# Patient Record
Sex: Male | Born: 1945 | Race: Black or African American | Hispanic: No | Marital: Single | Smoking: Current some day smoker
Health system: Southern US, Community
[De-identification: ages and names within clinical notes are randomized; demographics above are authoritative.]

## PROBLEM LIST (undated history)

## (undated) DIAGNOSIS — E785 Hyperlipidemia, unspecified: Secondary | ICD-10-CM

## (undated) DIAGNOSIS — K2270 Barrett's esophagus without dysplasia: Secondary | ICD-10-CM

## (undated) DIAGNOSIS — Z720 Tobacco use: Secondary | ICD-10-CM

## (undated) DIAGNOSIS — M1990 Unspecified osteoarthritis, unspecified site: Secondary | ICD-10-CM

## (undated) DIAGNOSIS — K449 Diaphragmatic hernia without obstruction or gangrene: Secondary | ICD-10-CM

## (undated) DIAGNOSIS — Z86010 Personal history of colonic polyps: Secondary | ICD-10-CM

## (undated) DIAGNOSIS — K08109 Complete loss of teeth, unspecified cause, unspecified class: Secondary | ICD-10-CM

## (undated) DIAGNOSIS — K635 Polyp of colon: Secondary | ICD-10-CM

## (undated) DIAGNOSIS — I219 Acute myocardial infarction, unspecified: Secondary | ICD-10-CM

## (undated) DIAGNOSIS — N390 Urinary tract infection, site not specified: Secondary | ICD-10-CM

## (undated) DIAGNOSIS — I10 Essential (primary) hypertension: Secondary | ICD-10-CM

## (undated) DIAGNOSIS — K3182 Dieulafoy lesion (hemorrhagic) of stomach and duodenum: Secondary | ICD-10-CM

## (undated) DIAGNOSIS — I714 Abdominal aortic aneurysm, without rupture, unspecified: Secondary | ICD-10-CM

## (undated) DIAGNOSIS — F1010 Alcohol abuse, uncomplicated: Secondary | ICD-10-CM

## (undated) DIAGNOSIS — K8020 Calculus of gallbladder without cholecystitis without obstruction: Secondary | ICD-10-CM

## (undated) DIAGNOSIS — N179 Acute kidney failure, unspecified: Secondary | ICD-10-CM

## (undated) DIAGNOSIS — Z860101 Personal history of adenomatous and serrated colon polyps: Secondary | ICD-10-CM

## (undated) DIAGNOSIS — C3491 Malignant neoplasm of unspecified part of right bronchus or lung: Secondary | ICD-10-CM

## (undated) DIAGNOSIS — K5730 Diverticulosis of large intestine without perforation or abscess without bleeding: Secondary | ICD-10-CM

## (undated) DIAGNOSIS — Z972 Presence of dental prosthetic device (complete) (partial): Secondary | ICD-10-CM

## (undated) DIAGNOSIS — I2510 Atherosclerotic heart disease of native coronary artery without angina pectoris: Secondary | ICD-10-CM

## (undated) DIAGNOSIS — K219 Gastro-esophageal reflux disease without esophagitis: Secondary | ICD-10-CM

## (undated) DIAGNOSIS — C3490 Malignant neoplasm of unspecified part of unspecified bronchus or lung: Secondary | ICD-10-CM

## (undated) DIAGNOSIS — N320 Bladder-neck obstruction: Secondary | ICD-10-CM

## (undated) DIAGNOSIS — K648 Other hemorrhoids: Secondary | ICD-10-CM

## (undated) DIAGNOSIS — H269 Unspecified cataract: Secondary | ICD-10-CM

## (undated) DIAGNOSIS — I509 Heart failure, unspecified: Secondary | ICD-10-CM

## (undated) HISTORY — DX: Barrett's esophagus without dysplasia: K22.70

## (undated) HISTORY — DX: Hyperlipidemia, unspecified: E78.5

## (undated) HISTORY — DX: Urinary tract infection, site not specified: N39.0

## (undated) HISTORY — DX: Gastro-esophageal reflux disease without esophagitis: K21.9

## (undated) HISTORY — DX: Bladder-neck obstruction: N32.0

## (undated) HISTORY — DX: Heart failure, unspecified: I50.9

## (undated) HISTORY — DX: Abdominal aortic aneurysm, without rupture, unspecified: I71.40

## (undated) HISTORY — DX: Polyp of colon: K63.5

## (undated) HISTORY — DX: Alcohol abuse, uncomplicated: F10.10

## (undated) HISTORY — DX: Unspecified cataract: H26.9

## (undated) HISTORY — DX: Malignant neoplasm of unspecified part of unspecified bronchus or lung: C34.90

## (undated) HISTORY — PX: MULTIPLE TOOTH EXTRACTIONS: SHX2053

## (undated) HISTORY — DX: Presence of dental prosthetic device (complete) (partial): Z97.2

## (undated) HISTORY — DX: Tobacco use: Z72.0

## (undated) HISTORY — DX: Personal history of colonic polyps: Z86.010

## (undated) HISTORY — DX: Malignant neoplasm of unspecified part of right bronchus or lung: C34.91

## (undated) HISTORY — DX: Other hemorrhoids: K64.8

## (undated) HISTORY — DX: Essential (primary) hypertension: I10

## (undated) HISTORY — DX: Acute kidney failure, unspecified: N17.9

## (undated) HISTORY — DX: Abdominal aortic aneurysm, without rupture: I71.4

## (undated) HISTORY — DX: Calculus of gallbladder without cholecystitis without obstruction: K80.20

## (undated) HISTORY — DX: Acute myocardial infarction, unspecified: I21.9

## (undated) HISTORY — DX: Diverticulosis of large intestine without perforation or abscess without bleeding: K57.30

## (undated) HISTORY — DX: Dieulafoy lesion (hemorrhagic) of stomach and duodenum: K31.82

## (undated) HISTORY — DX: Unspecified osteoarthritis, unspecified site: M19.90

## (undated) HISTORY — PX: CORONARY ANGIOPLASTY WITH STENT PLACEMENT: SHX49

## (undated) HISTORY — DX: Diaphragmatic hernia without obstruction or gangrene: K44.9

## (undated) HISTORY — PX: UPPER GASTROINTESTINAL ENDOSCOPY: SHX188

## (undated) HISTORY — DX: Complete loss of teeth, unspecified cause, unspecified class: K08.109

## (undated) HISTORY — DX: Personal history of adenomatous and serrated colon polyps: Z86.0101

## (undated) HISTORY — PX: COLONOSCOPY: SHX174

---

## 2002-06-17 DIAGNOSIS — I219 Acute myocardial infarction, unspecified: Secondary | ICD-10-CM

## 2002-06-17 HISTORY — DX: Acute myocardial infarction, unspecified: I21.9

## 2003-05-01 ENCOUNTER — Inpatient Hospital Stay (HOSPITAL_COMMUNITY): Admission: AD | Admit: 2003-05-01 | Discharge: 2003-05-05 | Payer: Self-pay | Admitting: *Deleted

## 2003-05-02 ENCOUNTER — Encounter: Payer: Self-pay | Admitting: Internal Medicine

## 2003-05-11 ENCOUNTER — Encounter: Admission: RE | Admit: 2003-05-11 | Discharge: 2003-05-11 | Payer: Self-pay | Admitting: Internal Medicine

## 2003-06-18 HISTORY — PX: INGUINAL HERNIA REPAIR: SUR1180

## 2003-06-29 ENCOUNTER — Encounter: Admission: RE | Admit: 2003-06-29 | Discharge: 2003-06-29 | Payer: Self-pay | Admitting: Internal Medicine

## 2003-08-13 ENCOUNTER — Inpatient Hospital Stay (HOSPITAL_COMMUNITY): Admission: EM | Admit: 2003-08-13 | Discharge: 2003-08-15 | Payer: Self-pay | Admitting: Emergency Medicine

## 2003-08-31 ENCOUNTER — Encounter: Admission: RE | Admit: 2003-08-31 | Discharge: 2003-08-31 | Payer: Self-pay | Admitting: Internal Medicine

## 2003-09-14 ENCOUNTER — Encounter: Admission: RE | Admit: 2003-09-14 | Discharge: 2003-09-14 | Payer: Self-pay | Admitting: Internal Medicine

## 2003-10-06 ENCOUNTER — Ambulatory Visit (HOSPITAL_COMMUNITY): Admission: RE | Admit: 2003-10-06 | Discharge: 2003-10-06 | Payer: Self-pay | Admitting: Dentistry

## 2003-10-31 ENCOUNTER — Ambulatory Visit (HOSPITAL_COMMUNITY): Admission: RE | Admit: 2003-10-31 | Discharge: 2003-10-31 | Payer: Self-pay | Admitting: Internal Medicine

## 2003-10-31 ENCOUNTER — Encounter: Admission: RE | Admit: 2003-10-31 | Discharge: 2003-10-31 | Payer: Self-pay | Admitting: Internal Medicine

## 2003-11-09 ENCOUNTER — Ambulatory Visit (HOSPITAL_COMMUNITY): Admission: RE | Admit: 2003-11-09 | Discharge: 2003-11-09 | Payer: Self-pay | Admitting: General Surgery

## 2003-11-10 ENCOUNTER — Emergency Department (HOSPITAL_COMMUNITY): Admission: EM | Admit: 2003-11-10 | Discharge: 2003-11-10 | Payer: Self-pay | Admitting: Emergency Medicine

## 2003-11-11 ENCOUNTER — Emergency Department (HOSPITAL_COMMUNITY): Admission: EM | Admit: 2003-11-11 | Discharge: 2003-11-11 | Payer: Self-pay | Admitting: Emergency Medicine

## 2003-12-12 ENCOUNTER — Encounter: Admission: RE | Admit: 2003-12-12 | Discharge: 2003-12-12 | Payer: Self-pay | Admitting: General Surgery

## 2003-12-13 ENCOUNTER — Ambulatory Visit (HOSPITAL_BASED_OUTPATIENT_CLINIC_OR_DEPARTMENT_OTHER): Admission: RE | Admit: 2003-12-13 | Discharge: 2003-12-13 | Payer: Self-pay | Admitting: General Surgery

## 2003-12-13 ENCOUNTER — Ambulatory Visit (HOSPITAL_COMMUNITY): Admission: RE | Admit: 2003-12-13 | Discharge: 2003-12-13 | Payer: Self-pay | Admitting: General Surgery

## 2004-03-10 ENCOUNTER — Emergency Department (HOSPITAL_COMMUNITY): Admission: EM | Admit: 2004-03-10 | Discharge: 2004-03-11 | Payer: Self-pay | Admitting: Emergency Medicine

## 2004-03-26 ENCOUNTER — Ambulatory Visit: Payer: Self-pay | Admitting: Cardiology

## 2004-06-25 ENCOUNTER — Ambulatory Visit: Payer: Self-pay | Admitting: Internal Medicine

## 2004-07-03 ENCOUNTER — Ambulatory Visit (HOSPITAL_COMMUNITY): Admission: RE | Admit: 2004-07-03 | Discharge: 2004-07-03 | Payer: Self-pay | Admitting: Internal Medicine

## 2004-07-16 ENCOUNTER — Ambulatory Visit: Payer: Self-pay | Admitting: Internal Medicine

## 2004-08-30 ENCOUNTER — Ambulatory Visit: Payer: Self-pay | Admitting: Internal Medicine

## 2004-09-11 ENCOUNTER — Ambulatory Visit: Payer: Self-pay | Admitting: Internal Medicine

## 2004-09-24 ENCOUNTER — Ambulatory Visit: Payer: Self-pay | Admitting: Internal Medicine

## 2004-10-16 ENCOUNTER — Ambulatory Visit: Payer: Self-pay | Admitting: Internal Medicine

## 2004-10-25 ENCOUNTER — Ambulatory Visit: Payer: Self-pay | Admitting: Internal Medicine

## 2004-10-25 ENCOUNTER — Encounter (INDEPENDENT_AMBULATORY_CARE_PROVIDER_SITE_OTHER): Payer: Self-pay | Admitting: Specialist

## 2004-12-12 ENCOUNTER — Ambulatory Visit: Payer: Self-pay | Admitting: Internal Medicine

## 2005-03-26 ENCOUNTER — Ambulatory Visit: Payer: Self-pay | Admitting: Hospitalist

## 2005-04-05 ENCOUNTER — Ambulatory Visit: Payer: Self-pay | Admitting: Internal Medicine

## 2005-07-17 ENCOUNTER — Ambulatory Visit: Payer: Self-pay | Admitting: Cardiology

## 2005-07-25 ENCOUNTER — Ambulatory Visit: Payer: Self-pay

## 2005-09-17 ENCOUNTER — Ambulatory Visit: Payer: Self-pay | Admitting: Internal Medicine

## 2005-09-24 ENCOUNTER — Ambulatory Visit: Payer: Self-pay | Admitting: Hospitalist

## 2005-09-30 ENCOUNTER — Ambulatory Visit: Payer: Self-pay | Admitting: Internal Medicine

## 2005-10-07 ENCOUNTER — Ambulatory Visit: Payer: Self-pay | Admitting: Cardiology

## 2005-10-15 ENCOUNTER — Ambulatory Visit: Payer: Self-pay | Admitting: Internal Medicine

## 2005-11-04 ENCOUNTER — Ambulatory Visit: Payer: Self-pay | Admitting: Cardiology

## 2005-11-06 ENCOUNTER — Ambulatory Visit: Payer: Self-pay | Admitting: Cardiology

## 2005-11-15 ENCOUNTER — Emergency Department (HOSPITAL_COMMUNITY): Admission: EM | Admit: 2005-11-15 | Discharge: 2005-11-15 | Payer: Self-pay | Admitting: Emergency Medicine

## 2005-11-29 ENCOUNTER — Ambulatory Visit: Payer: Self-pay | Admitting: Internal Medicine

## 2005-12-06 ENCOUNTER — Encounter (INDEPENDENT_AMBULATORY_CARE_PROVIDER_SITE_OTHER): Payer: Self-pay | Admitting: Specialist

## 2005-12-06 ENCOUNTER — Ambulatory Visit: Payer: Self-pay | Admitting: Internal Medicine

## 2006-01-17 ENCOUNTER — Ambulatory Visit: Payer: Self-pay | Admitting: Hospitalist

## 2006-01-20 ENCOUNTER — Other Ambulatory Visit: Admission: RE | Admit: 2006-01-20 | Discharge: 2006-01-20 | Payer: Self-pay | Admitting: Internal Medicine

## 2006-01-20 ENCOUNTER — Encounter: Admission: RE | Admit: 2006-01-20 | Discharge: 2006-01-20 | Payer: Self-pay | Admitting: Internal Medicine

## 2006-01-20 ENCOUNTER — Encounter (INDEPENDENT_AMBULATORY_CARE_PROVIDER_SITE_OTHER): Payer: Self-pay | Admitting: *Deleted

## 2006-01-20 ENCOUNTER — Ambulatory Visit: Payer: Self-pay | Admitting: Internal Medicine

## 2006-01-24 ENCOUNTER — Ambulatory Visit: Payer: Self-pay | Admitting: Internal Medicine

## 2006-02-06 ENCOUNTER — Ambulatory Visit: Payer: Self-pay | Admitting: Internal Medicine

## 2006-02-13 ENCOUNTER — Ambulatory Visit: Payer: Self-pay | Admitting: Hospitalist

## 2006-03-22 IMAGING — CR DG CHEST 2V
2 series · 2 of 2 positions shown · non-contrast
Comparison: 08/13/03.

CLINICAL DATA: Shortness of breath.

[view not recorded (1 of 2)]
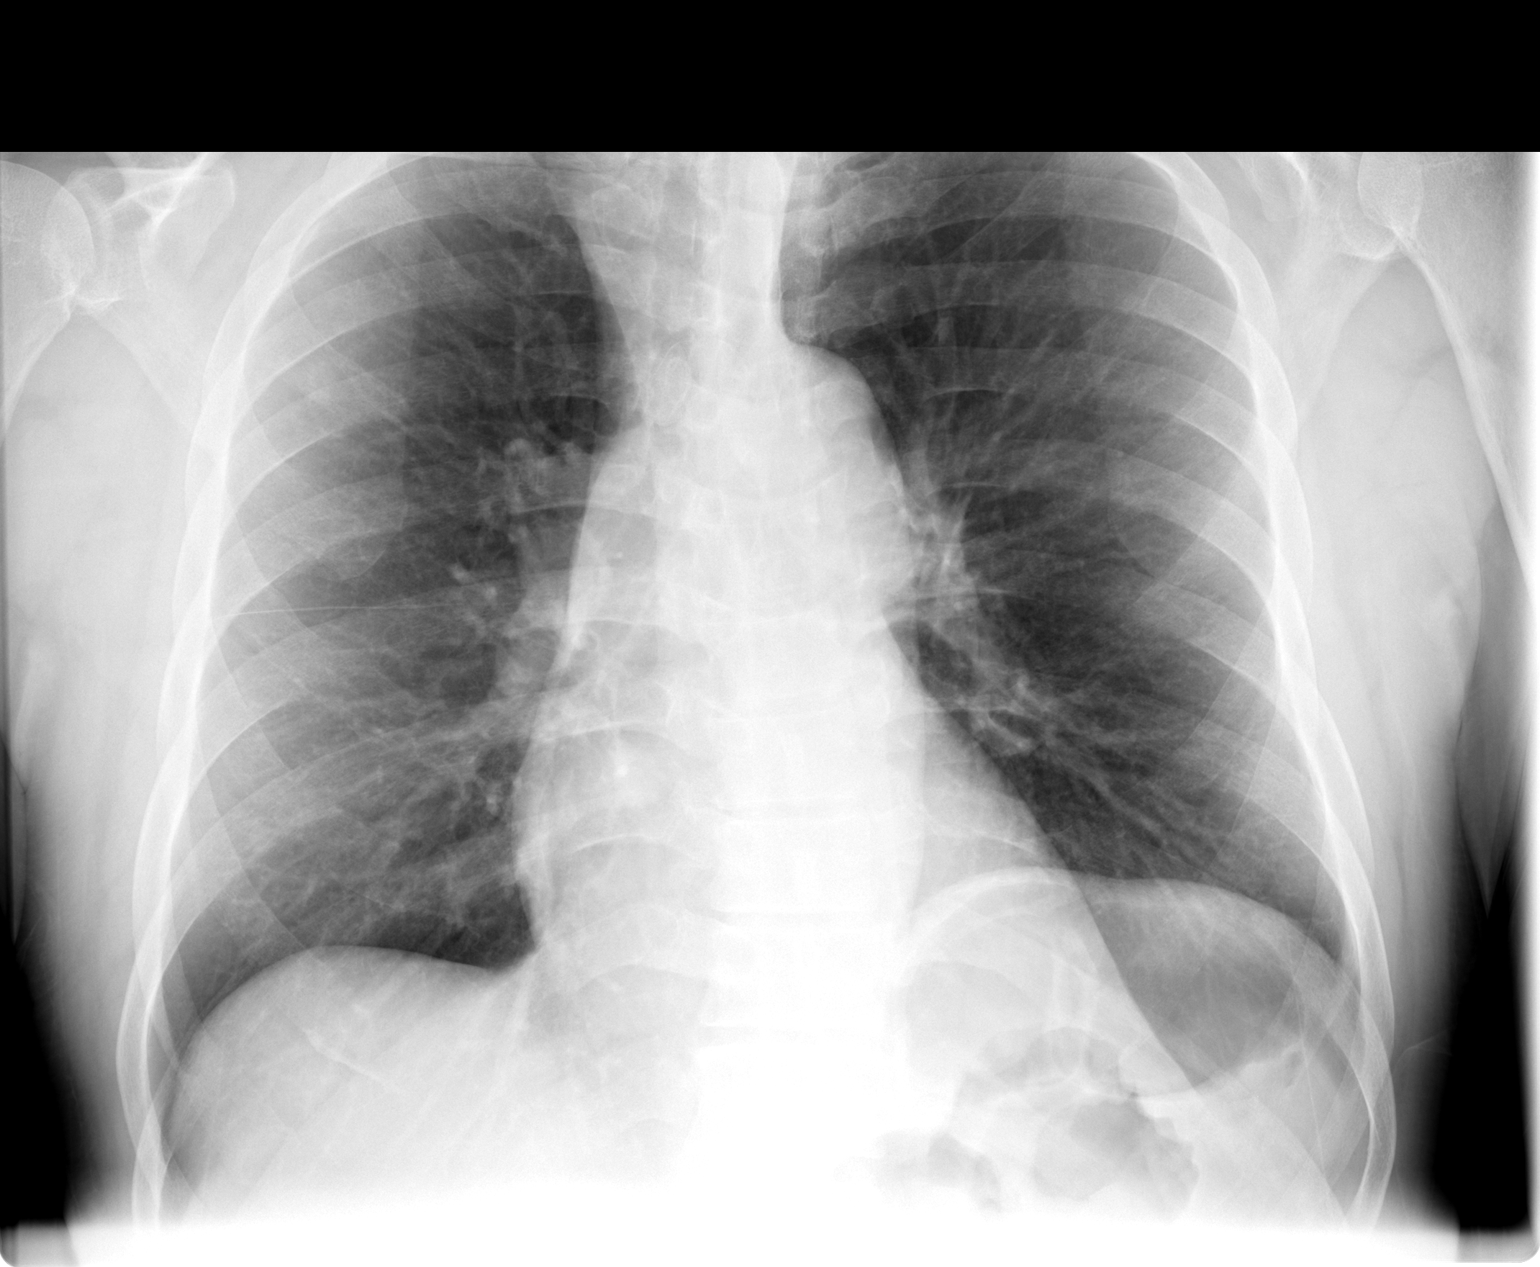

[view not recorded (2 of 2)]
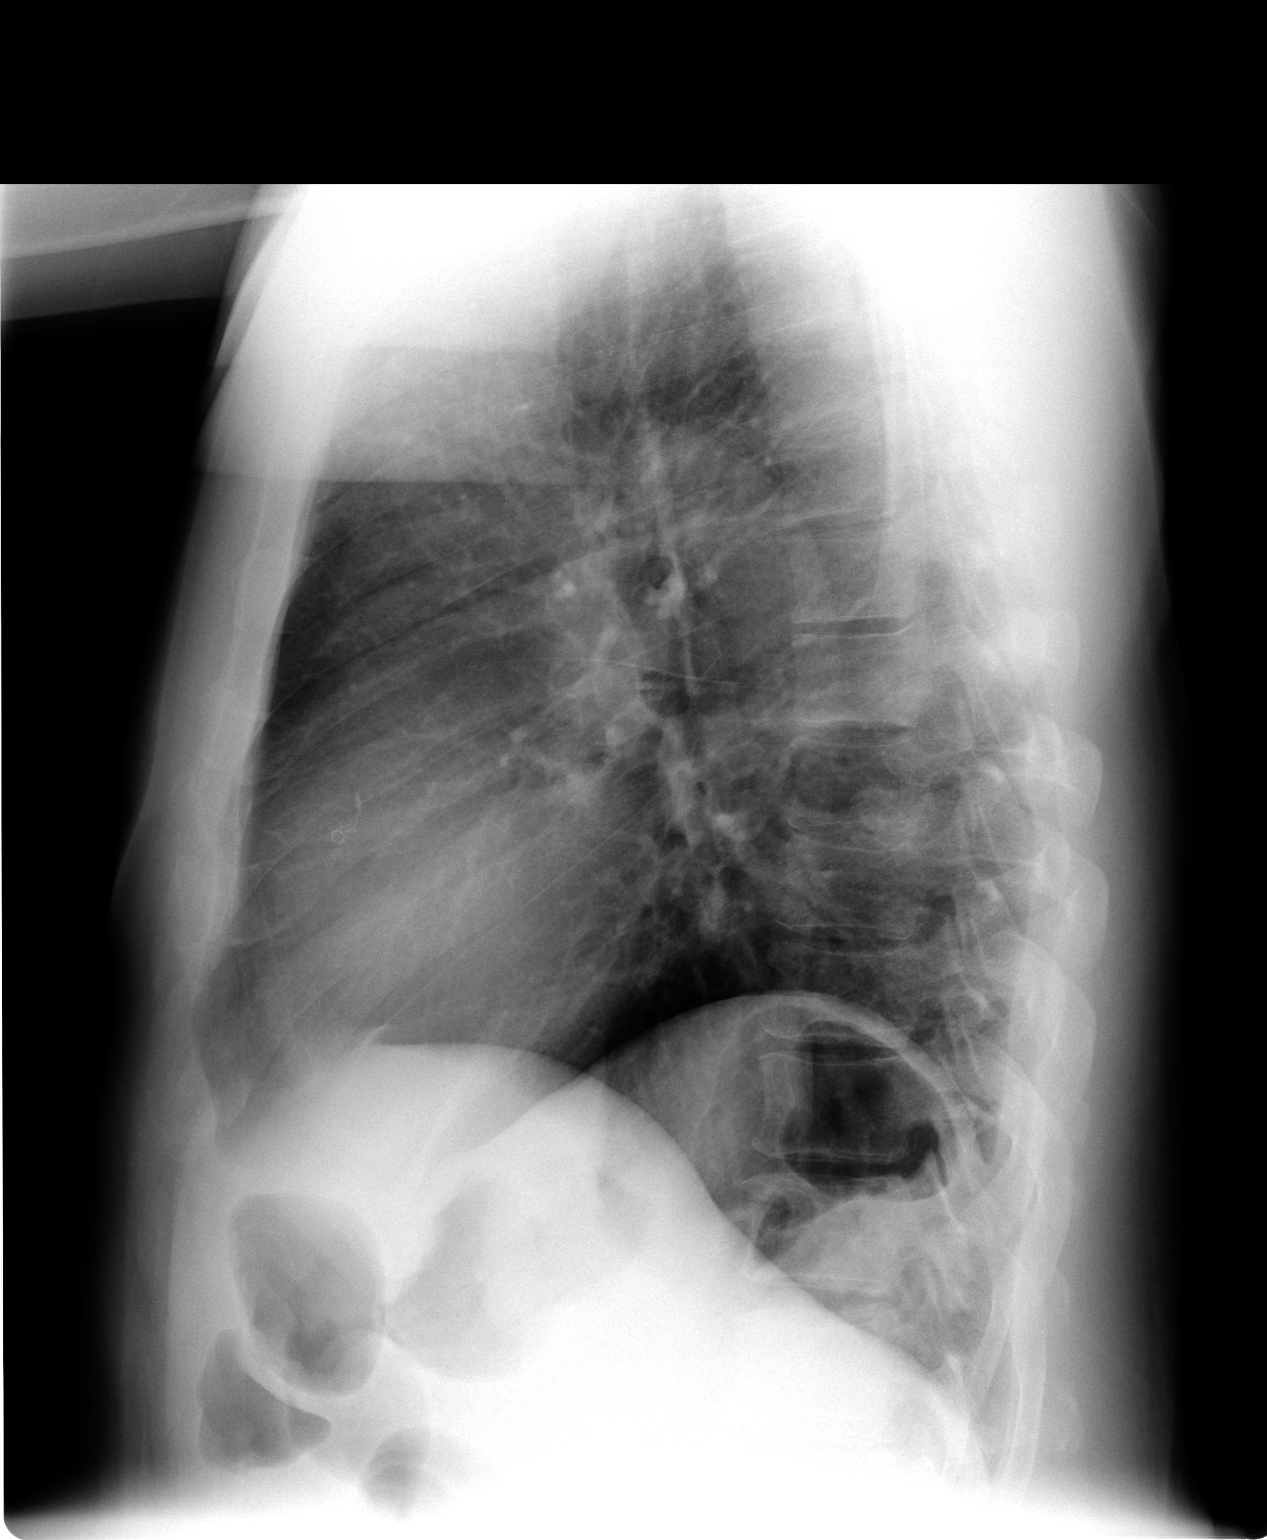

[2 of 2 positions shown; findings below may reference images not displayed]

CHEST TWO VIEWS
 The lungs are free of any focal consolidation or edema.  Cardiomediastinal contours are within normal limits.  There are no pleural effusions.  Bony thorax is intact.
 IMPRESSION
 No radiographic evidence of an acute cardiopulmonary process.

## 2006-04-02 IMAGING — CR DG KNEE COMPLETE 4+V*R*
4 series · 4 of 4 positions shown · non-contrast
Comparison: none

CLINICAL DATA: Right anterior knee pain.  No trauma.
 RIGHT KNEE 11/11/03
 There is prepatellar soft tissue swelling noted.  This is a nonspecific appearance, but may be seen with prepatellar bursitis.  There are no bony abnormalities, and there is no evidence for joint effusion. 
 IMPRESSION
 Prepatellar soft tissue swelling.  Otherwise normal study.

[view not recorded (1 of 4)]
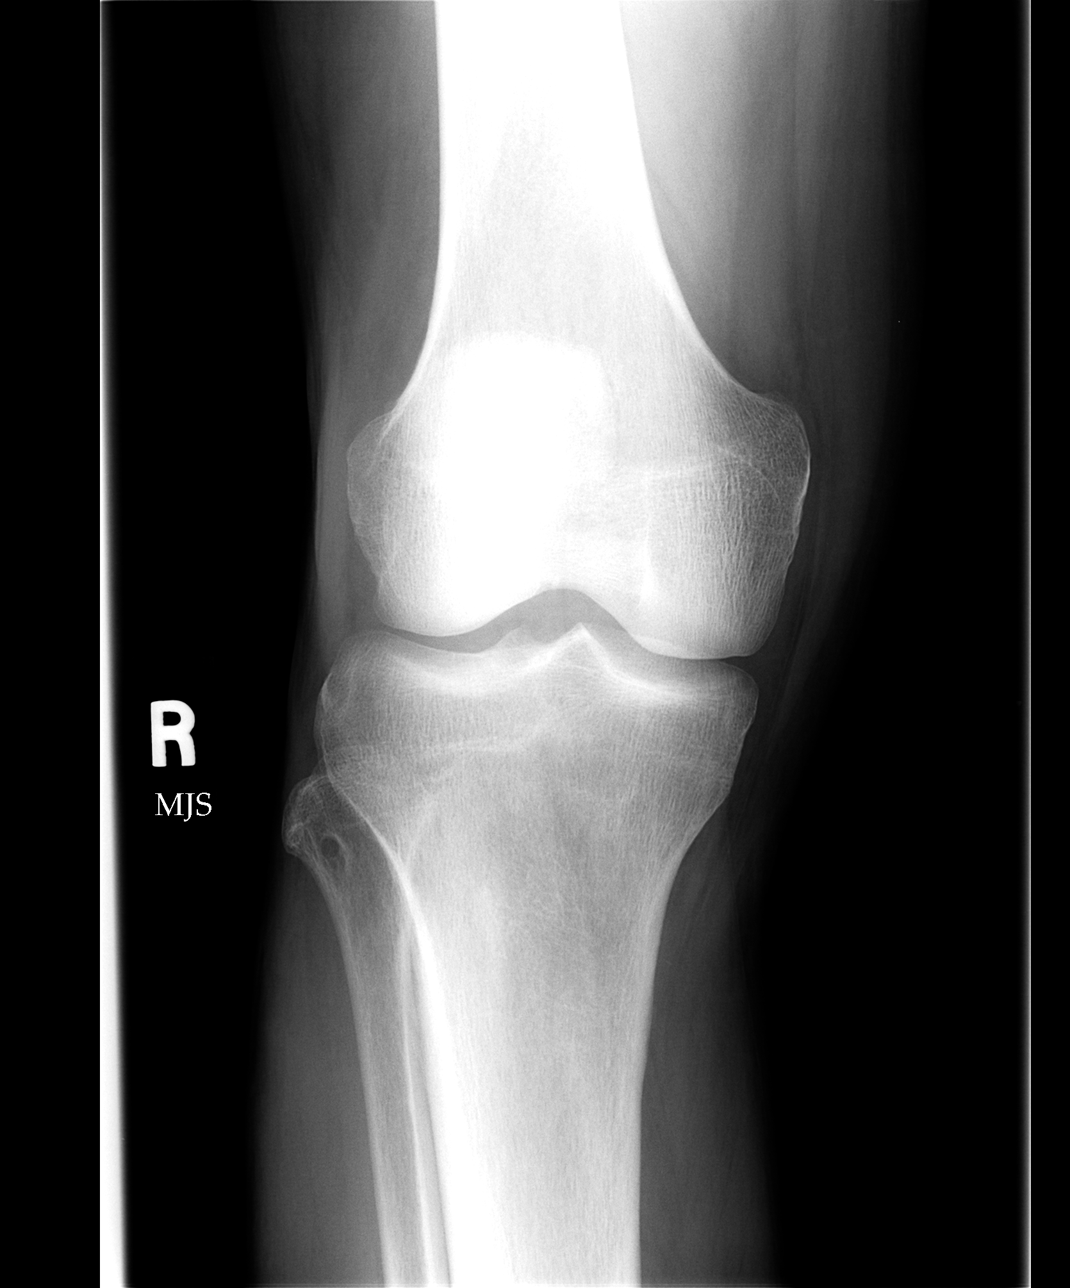

[view not recorded (2 of 4)]
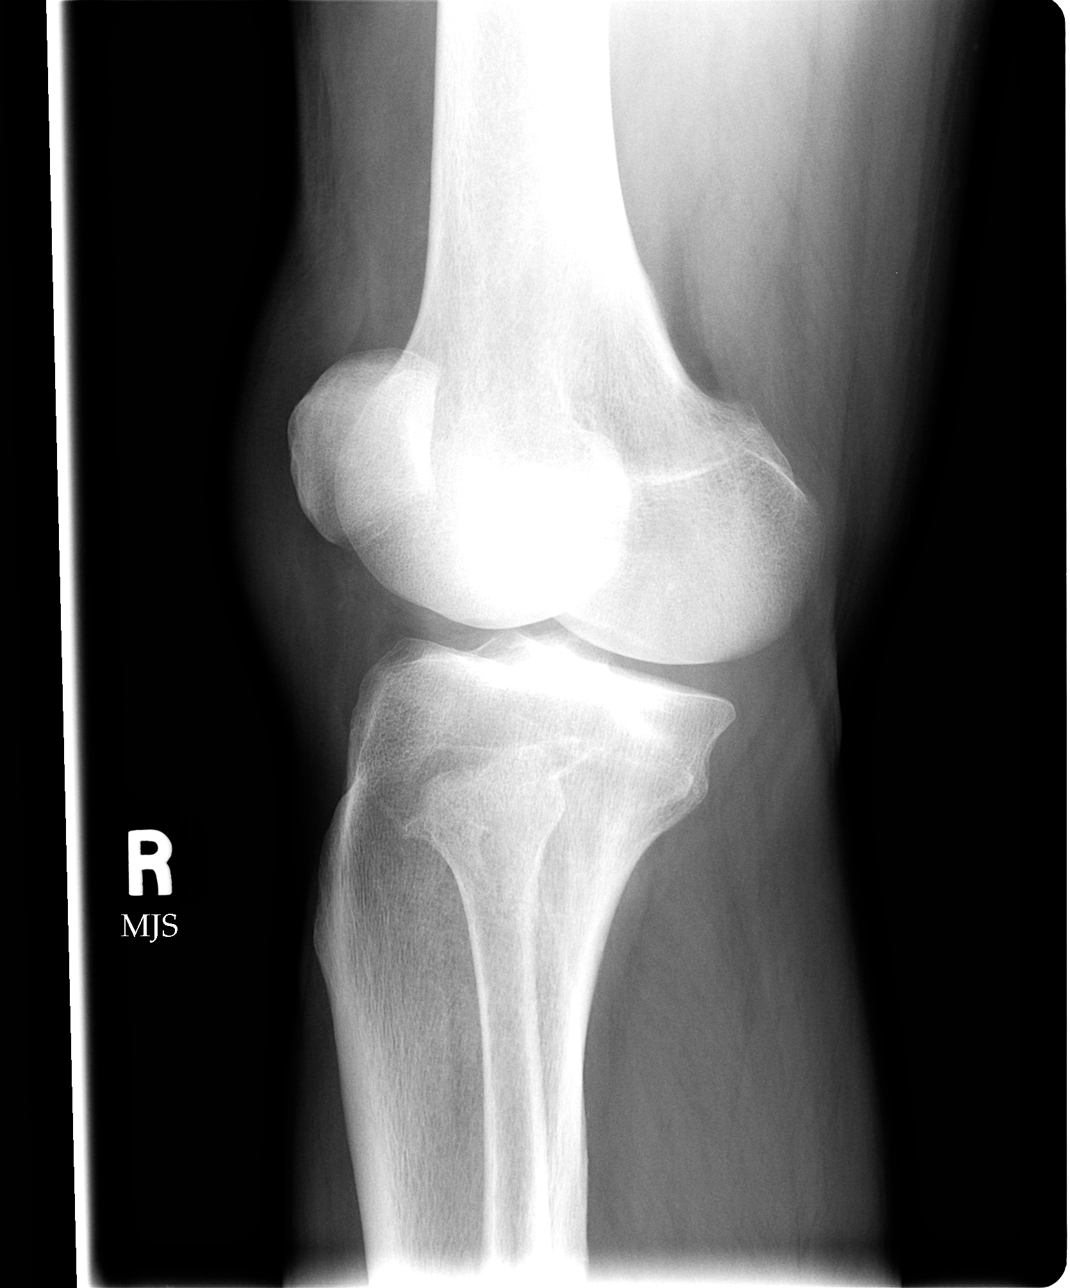

[view not recorded (3 of 4)]
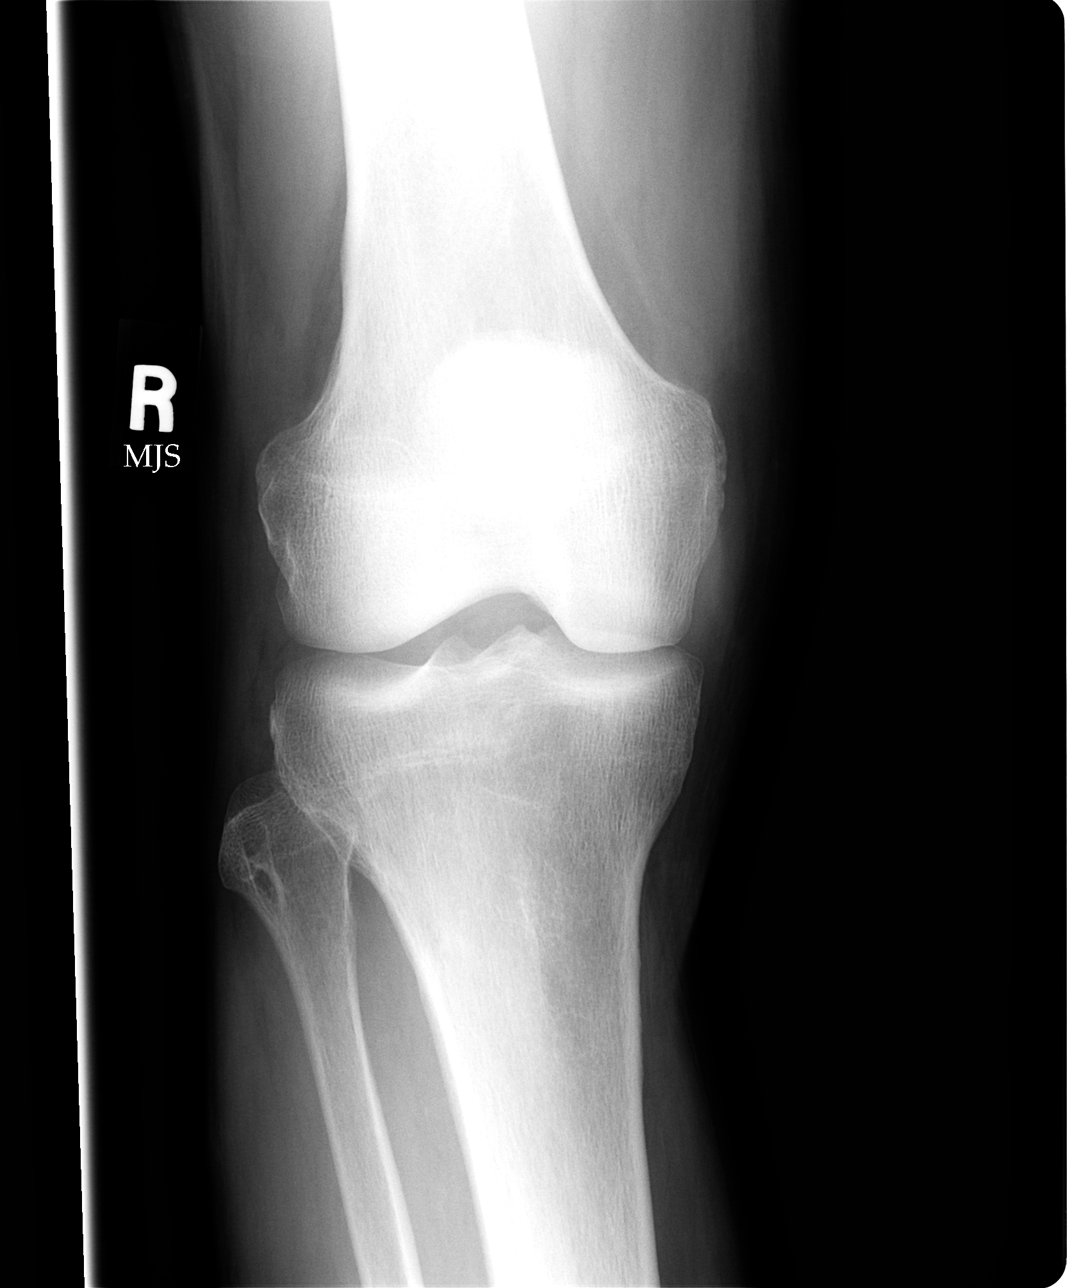

[view not recorded (4 of 4)]
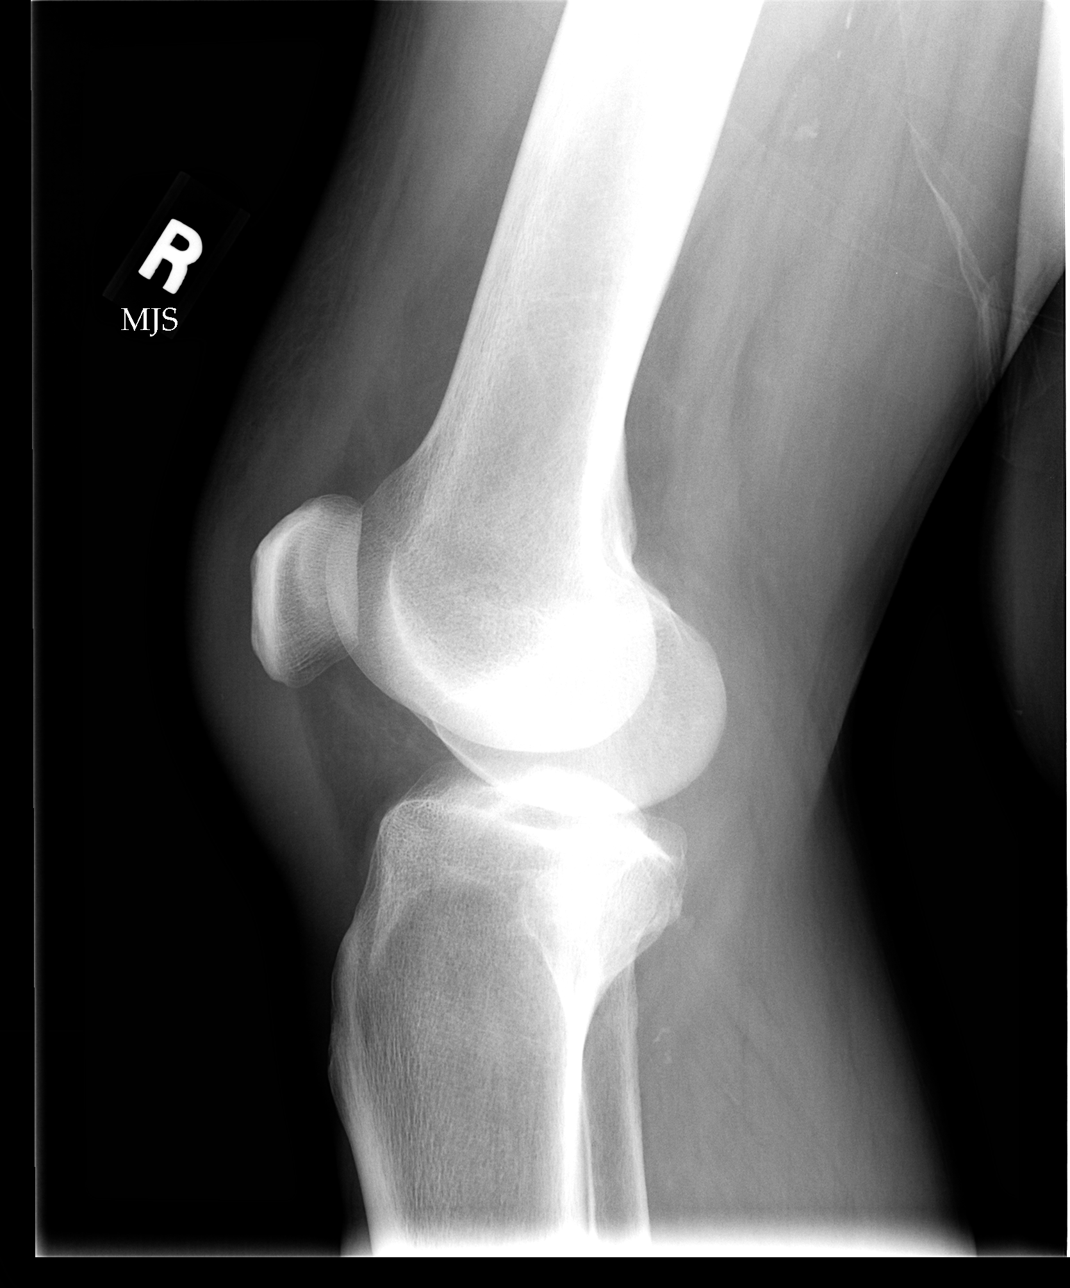

[4 of 4 positions shown; findings below may reference images not displayed]

## 2006-04-17 HISTORY — PX: LUNG LOBECTOMY: SHX167

## 2006-04-18 DIAGNOSIS — E785 Hyperlipidemia, unspecified: Secondary | ICD-10-CM

## 2006-04-18 DIAGNOSIS — K2270 Barrett's esophagus without dysplasia: Secondary | ICD-10-CM

## 2006-04-18 DIAGNOSIS — M109 Gout, unspecified: Secondary | ICD-10-CM

## 2006-04-18 DIAGNOSIS — I10 Essential (primary) hypertension: Secondary | ICD-10-CM | POA: Insufficient documentation

## 2006-04-18 DIAGNOSIS — K219 Gastro-esophageal reflux disease without esophagitis: Secondary | ICD-10-CM

## 2006-04-18 DIAGNOSIS — K635 Polyp of colon: Secondary | ICD-10-CM

## 2006-04-18 DIAGNOSIS — F17200 Nicotine dependence, unspecified, uncomplicated: Secondary | ICD-10-CM | POA: Insufficient documentation

## 2006-04-18 HISTORY — DX: Polyp of colon: K63.5

## 2006-04-19 ENCOUNTER — Inpatient Hospital Stay (HOSPITAL_COMMUNITY): Admission: EM | Admit: 2006-04-19 | Discharge: 2006-04-25 | Payer: Self-pay | Admitting: *Deleted

## 2006-04-19 ENCOUNTER — Ambulatory Visit: Payer: Self-pay | Admitting: Internal Medicine

## 2006-04-24 ENCOUNTER — Encounter: Payer: Self-pay | Admitting: *Deleted

## 2006-04-28 ENCOUNTER — Ambulatory Visit: Payer: Self-pay | Admitting: Internal Medicine

## 2006-04-28 ENCOUNTER — Ambulatory Visit: Payer: Self-pay | Admitting: Cardiology

## 2006-04-28 ENCOUNTER — Inpatient Hospital Stay (HOSPITAL_COMMUNITY)
Admission: RE | Admit: 2006-04-28 | Discharge: 2006-06-20 | Payer: Self-pay | Admitting: Thoracic Surgery (Cardiothoracic Vascular Surgery)

## 2006-04-28 ENCOUNTER — Encounter (INDEPENDENT_AMBULATORY_CARE_PROVIDER_SITE_OTHER): Payer: Self-pay | Admitting: Specialist

## 2006-04-30 ENCOUNTER — Encounter: Payer: Self-pay | Admitting: Cardiology

## 2006-05-03 IMAGING — CR DG CHEST 2V
2 series · 2 of 2 positions shown · non-contrast
Comparison: none

CLINICAL DATA: Patient has a cough.  Preop respiratory exam for hernia surgery.  Patient is a smoker.
 TWO VIEW CHEST 
 PA and lateral views of the chest are made and are compared to previous studies of 10/31/03 and show generalized peribronchial thickening consistent with a chronic bronchitis which has not changed significantly since previous study.  There is again seen slight elevation of the left hemidiaphragm.  The heart and mediastinum are normal. There is no consolidation, pleural effusion, or pneumothorax.
 Bony thorax is normal.
 IMPRESSION
 Generalized peribronchial thickening which appears to be stable, probably represents chronic bronchitis.  No active infiltrate or consolidation is seen.

[view not recorded (1 of 2)]
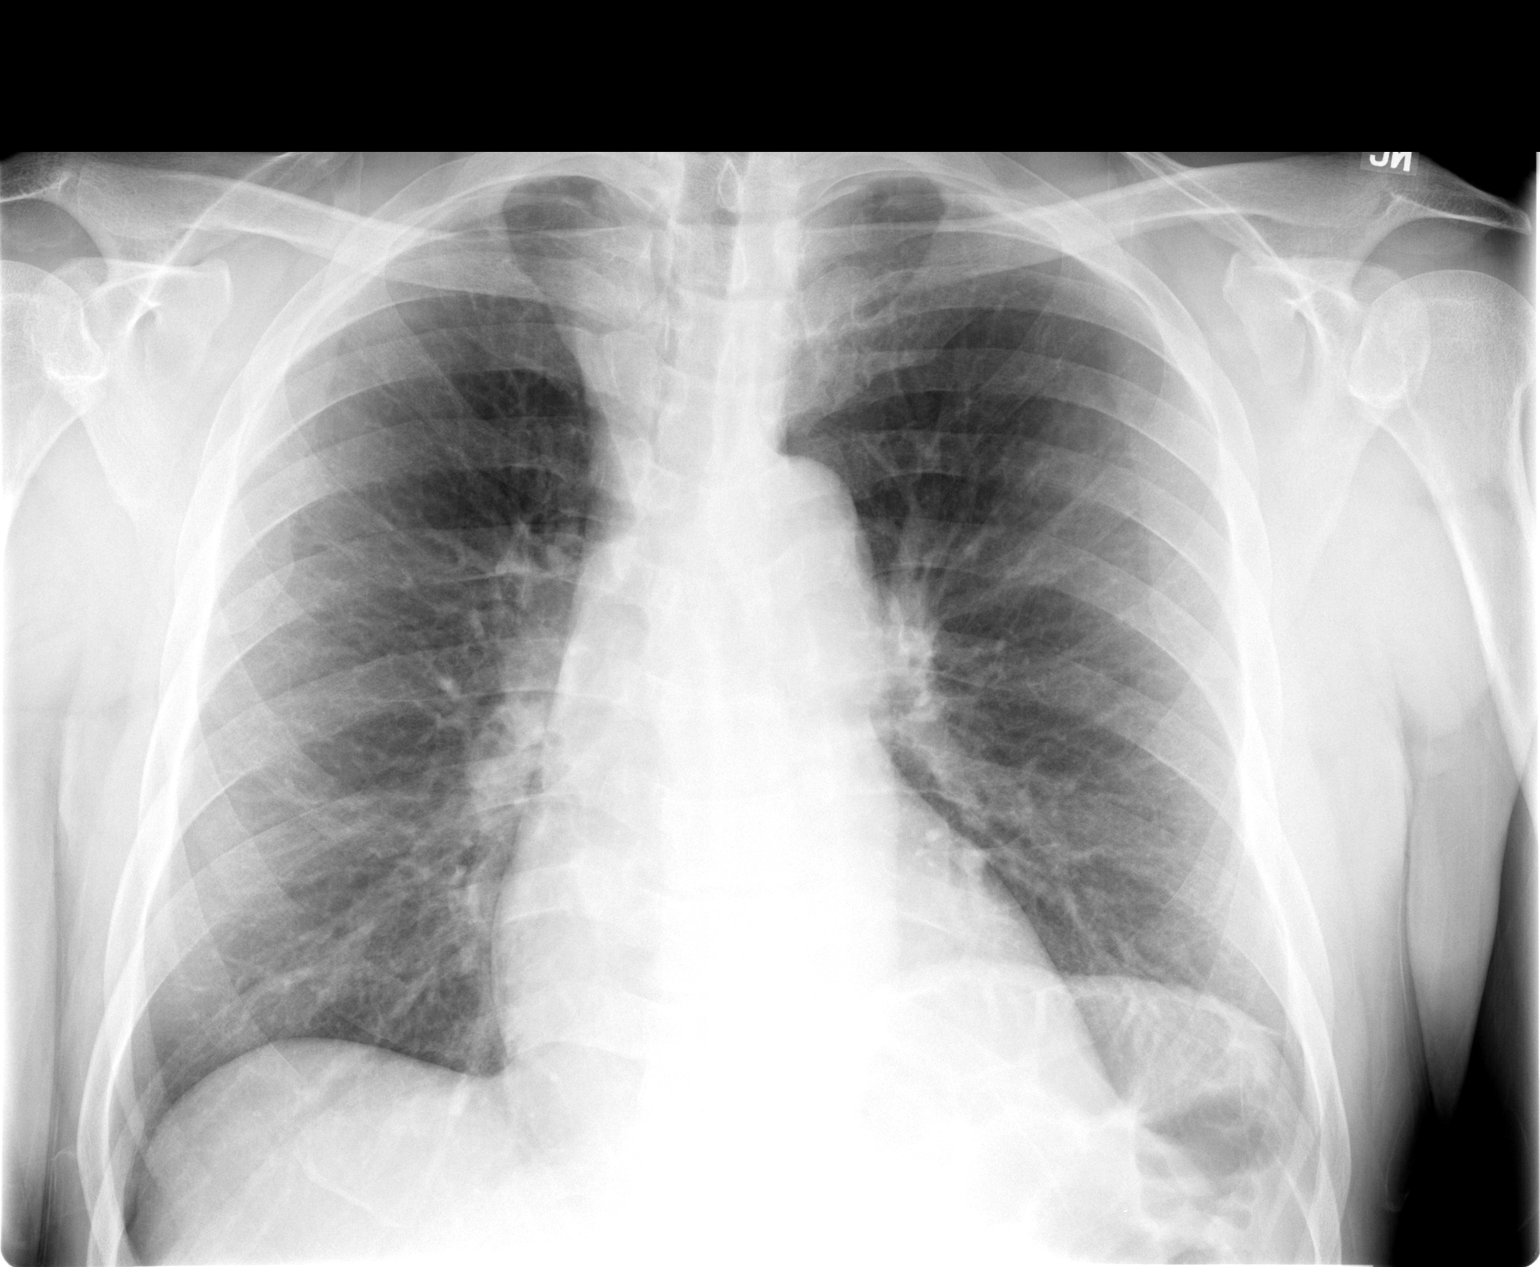

[view not recorded (2 of 2)]
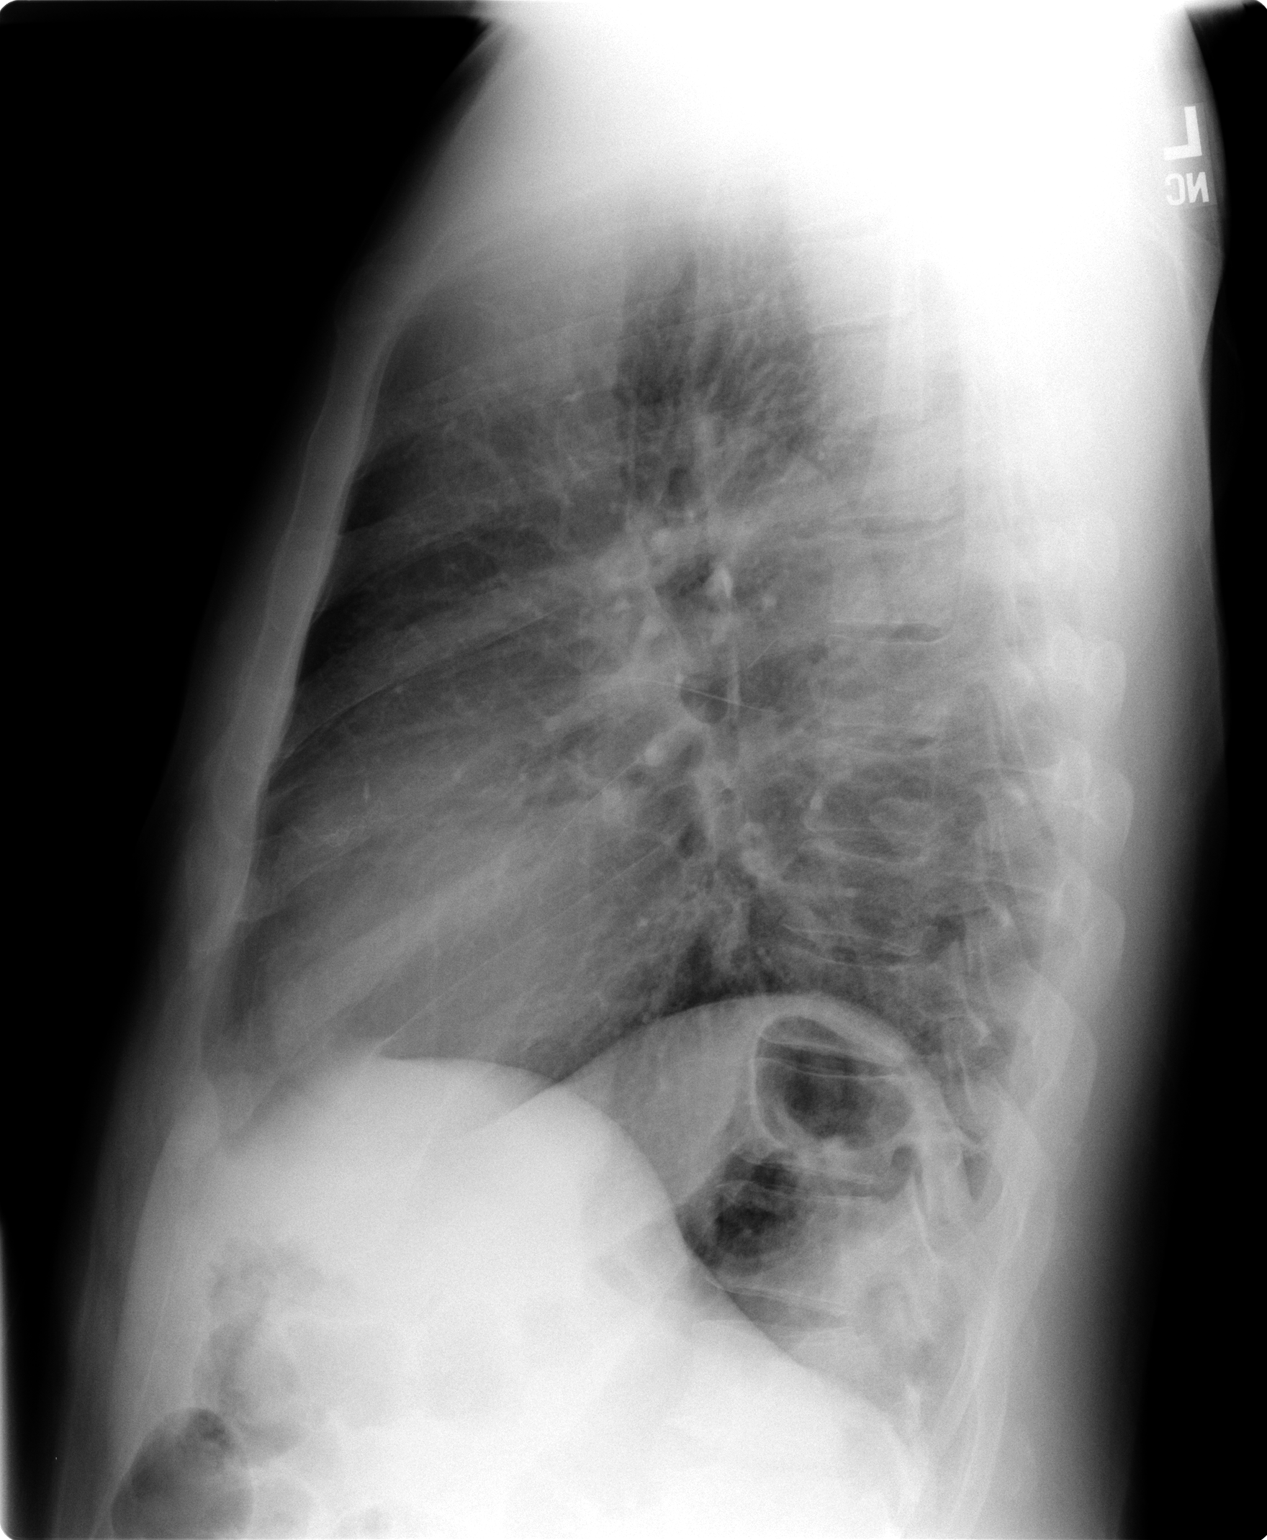

[2 of 2 positions shown; findings below may reference images not displayed]

## 2006-05-24 HISTORY — PX: OTHER SURGICAL HISTORY: SHX169

## 2006-06-06 ENCOUNTER — Encounter: Payer: Self-pay | Admitting: Gastroenterology

## 2006-06-16 ENCOUNTER — Ambulatory Visit: Payer: Self-pay | Admitting: Gastroenterology

## 2006-07-07 ENCOUNTER — Telehealth: Payer: Self-pay | Admitting: *Deleted

## 2006-07-09 ENCOUNTER — Ambulatory Visit: Payer: Self-pay | Admitting: Internal Medicine

## 2006-07-14 ENCOUNTER — Ambulatory Visit: Payer: Self-pay | Admitting: Hospitalist

## 2006-07-14 DIAGNOSIS — C3490 Malignant neoplasm of unspecified part of unspecified bronchus or lung: Secondary | ICD-10-CM

## 2006-07-15 LAB — CBC WITH DIFFERENTIAL/PLATELET
BASO%: 0.4 % (ref 0.0–2.0)
Eosinophils Absolute: 0.1 10*3/uL (ref 0.0–0.5)
HCT: 35.9 % — ABNORMAL LOW (ref 38.7–49.9)
LYMPH%: 28.6 % (ref 14.0–48.0)
MCHC: 32.4 g/dL (ref 32.0–35.9)
MCV: 85.5 fL (ref 81.6–98.0)
MONO#: 0.4 10*3/uL (ref 0.1–0.9)
MONO%: 5.5 % (ref 0.0–13.0)
NEUT%: 64.1 % (ref 40.0–75.0)
Platelets: 243 10*3/uL (ref 145–400)
RBC: 4.2 10*6/uL (ref 4.20–5.71)
WBC: 7.8 10*3/uL (ref 4.0–10.0)

## 2006-07-15 LAB — COMPREHENSIVE METABOLIC PANEL
Alkaline Phosphatase: 78 U/L (ref 39–117)
CO2: 24 mEq/L (ref 19–32)
Creatinine, Ser: 1.34 mg/dL (ref 0.40–1.50)
Glucose, Bld: 94 mg/dL (ref 70–99)
Total Bilirubin: 0.4 mg/dL (ref 0.3–1.2)

## 2006-07-16 ENCOUNTER — Ambulatory Visit (HOSPITAL_COMMUNITY): Admission: RE | Admit: 2006-07-16 | Discharge: 2006-07-16 | Payer: Self-pay | Admitting: Internal Medicine

## 2006-07-16 ENCOUNTER — Encounter (INDEPENDENT_AMBULATORY_CARE_PROVIDER_SITE_OTHER): Payer: Self-pay | Admitting: Infectious Diseases

## 2006-07-16 ENCOUNTER — Ambulatory Visit: Payer: Self-pay | Admitting: Hospitalist

## 2006-07-16 LAB — CONVERTED CEMR LAB
AST: 15 units/L (ref 0–37)
BUN: 20 mg/dL (ref 6–23)
Basophils Absolute: 0 10*3/uL (ref 0.0–0.1)
Basophils Relative: 1 % (ref 0–1)
CO2: 26 meq/L (ref 19–32)
Calcium: 9.6 mg/dL (ref 8.4–10.5)
Chloride: 104 meq/L (ref 96–112)
Cholesterol: 148 mg/dL (ref 0–200)
Creatinine, Ser: 1.31 mg/dL (ref 0.40–1.50)
Eosinophils Relative: 1 % (ref 0–5)
HCT: 37.3 % — ABNORMAL LOW (ref 39.0–52.0)
HDL: 29 mg/dL — ABNORMAL LOW (ref >39)
Hemoglobin: 11.8 g/dL — ABNORMAL LOW (ref 13.0–17.0)
MCHC: 31.6 g/dL (ref 30.0–36.0)
MCV: 86.7 fL (ref 78.0–100.0)
Monocytes Absolute: 0.3 10*3/uL (ref 0.2–0.7)
Monocytes Relative: 4 % (ref 3–11)
Neutro Abs: 4 10*3/uL (ref 1.7–7.7)
RBC: 4.3 M/uL (ref 4.22–5.81)
RDW: 14.6 % — ABNORMAL HIGH (ref 11.5–14.0)
Total Bilirubin: 0.5 mg/dL (ref 0.3–1.2)
Total CHOL/HDL Ratio: 5.1
VLDL: 18 mg/dL (ref 0–40)

## 2006-07-22 ENCOUNTER — Ambulatory Visit: Payer: Self-pay | Admitting: Internal Medicine

## 2006-07-23 ENCOUNTER — Telehealth: Payer: Self-pay | Admitting: *Deleted

## 2006-07-31 ENCOUNTER — Ambulatory Visit: Payer: Self-pay | Admitting: Internal Medicine

## 2006-08-19 ENCOUNTER — Encounter
Admission: RE | Admit: 2006-08-19 | Discharge: 2006-08-19 | Payer: Self-pay | Admitting: Thoracic Surgery (Cardiothoracic Vascular Surgery)

## 2006-08-19 ENCOUNTER — Ambulatory Visit: Payer: Self-pay | Admitting: Thoracic Surgery (Cardiothoracic Vascular Surgery)

## 2006-09-01 ENCOUNTER — Telehealth: Payer: Self-pay | Admitting: *Deleted

## 2006-09-17 ENCOUNTER — Encounter (INDEPENDENT_AMBULATORY_CARE_PROVIDER_SITE_OTHER): Payer: Self-pay | Admitting: Internal Medicine

## 2006-11-07 ENCOUNTER — Telehealth (INDEPENDENT_AMBULATORY_CARE_PROVIDER_SITE_OTHER): Payer: Self-pay | Admitting: *Deleted

## 2006-12-01 ENCOUNTER — Telehealth (INDEPENDENT_AMBULATORY_CARE_PROVIDER_SITE_OTHER): Payer: Self-pay | Admitting: *Deleted

## 2006-12-18 ENCOUNTER — Telehealth (INDEPENDENT_AMBULATORY_CARE_PROVIDER_SITE_OTHER): Payer: Self-pay | Admitting: *Deleted

## 2007-01-02 ENCOUNTER — Ambulatory Visit: Payer: Self-pay | Admitting: Internal Medicine

## 2007-01-06 LAB — CBC WITH DIFFERENTIAL/PLATELET
Basophils Absolute: 0 10*3/uL (ref 0.0–0.1)
Eosinophils Absolute: 0.1 10*3/uL (ref 0.0–0.5)
HCT: 38.6 % — ABNORMAL LOW (ref 38.7–49.9)
LYMPH%: 31.6 % (ref 14.0–48.0)
MCV: 92.3 fL (ref 81.6–98.0)
MONO#: 0.4 10*3/uL (ref 0.1–0.9)
MONO%: 5.3 % (ref 0.0–13.0)
NEUT#: 4.7 10*3/uL (ref 1.5–6.5)
NEUT%: 61.4 % (ref 40.0–75.0)
Platelets: 177 10*3/uL (ref 145–400)
WBC: 7.6 10*3/uL (ref 4.0–10.0)

## 2007-01-06 LAB — COMPREHENSIVE METABOLIC PANEL
BUN: 13 mg/dL (ref 6–23)
CO2: 26 mEq/L (ref 19–32)
Creatinine, Ser: 1.29 mg/dL (ref 0.40–1.50)
Glucose, Bld: 93 mg/dL (ref 70–99)
Sodium: 143 mEq/L (ref 135–145)
Total Bilirubin: 0.6 mg/dL (ref 0.3–1.2)
Total Protein: 7 g/dL (ref 6.0–8.3)

## 2007-01-09 ENCOUNTER — Ambulatory Visit (HOSPITAL_COMMUNITY): Admission: RE | Admit: 2007-01-09 | Discharge: 2007-01-09 | Payer: Self-pay | Admitting: Internal Medicine

## 2007-01-14 ENCOUNTER — Ambulatory Visit: Payer: Self-pay | Admitting: *Deleted

## 2007-01-14 ENCOUNTER — Encounter (INDEPENDENT_AMBULATORY_CARE_PROVIDER_SITE_OTHER): Payer: Self-pay | Admitting: Internal Medicine

## 2007-01-14 LAB — CONVERTED CEMR LAB
BUN: 12 mg/dL (ref 6–23)
Calcium: 9.2 mg/dL (ref 8.4–10.5)
Glucose, Bld: 89 mg/dL (ref 70–99)

## 2007-01-28 ENCOUNTER — Encounter (INDEPENDENT_AMBULATORY_CARE_PROVIDER_SITE_OTHER): Payer: Self-pay | Admitting: Internal Medicine

## 2007-01-28 ENCOUNTER — Ambulatory Visit: Payer: Self-pay | Admitting: Infectious Disease

## 2007-01-29 ENCOUNTER — Ambulatory Visit: Payer: Self-pay | Admitting: Thoracic Surgery (Cardiothoracic Vascular Surgery)

## 2007-01-29 LAB — CONVERTED CEMR LAB
Calcium: 9.5 mg/dL (ref 8.4–10.5)
Potassium: 4.2 meq/L (ref 3.5–5.3)
Sodium: 138 meq/L (ref 135–145)

## 2007-02-09 ENCOUNTER — Telehealth: Payer: Self-pay | Admitting: *Deleted

## 2007-03-13 ENCOUNTER — Encounter (INDEPENDENT_AMBULATORY_CARE_PROVIDER_SITE_OTHER): Payer: Self-pay | Admitting: Internal Medicine

## 2007-03-13 ENCOUNTER — Ambulatory Visit: Payer: Self-pay | Admitting: Internal Medicine

## 2007-03-16 ENCOUNTER — Telehealth: Payer: Self-pay | Admitting: *Deleted

## 2007-03-16 LAB — CONVERTED CEMR LAB
BUN: 22 mg/dL (ref 6–23)
CO2: 24 meq/L (ref 19–32)
Creatinine, Ser: 1.64 mg/dL — ABNORMAL HIGH (ref 0.40–1.50)
Eosinophils Relative: 2 % (ref 0–5)
Glucose, Bld: 99 mg/dL (ref 70–99)
HCT: 41.8 % (ref 39.0–52.0)
Hemoglobin: 13.8 g/dL (ref 13.0–17.0)
Lymphocytes Relative: 29 % (ref 12–46)
Lymphs Abs: 2.3 10*3/uL (ref 0.7–3.3)
Monocytes Absolute: 0.4 10*3/uL (ref 0.2–0.7)
Monocytes Relative: 6 % (ref 3–11)
RBC: 4.58 M/uL (ref 4.22–5.81)
Sodium: 140 meq/L (ref 135–145)
TSH: 1.161 microintl units/mL (ref 0.350–5.50)
Total Bilirubin: 0.5 mg/dL (ref 0.3–1.2)
Total Protein: 7.9 g/dL (ref 6.0–8.3)
WBC: 7.9 10*3/uL (ref 4.0–10.5)

## 2007-03-19 ENCOUNTER — Telehealth: Payer: Self-pay | Admitting: *Deleted

## 2007-03-19 ENCOUNTER — Telehealth: Payer: Self-pay | Admitting: Infectious Diseases

## 2007-03-27 ENCOUNTER — Ambulatory Visit: Payer: Self-pay | Admitting: Infectious Diseases

## 2007-03-27 ENCOUNTER — Encounter (INDEPENDENT_AMBULATORY_CARE_PROVIDER_SITE_OTHER): Payer: Self-pay | Admitting: Internal Medicine

## 2007-03-28 LAB — CONVERTED CEMR LAB
CO2: 24 meq/L (ref 19–32)
Calcium: 9.3 mg/dL (ref 8.4–10.5)
Sodium: 142 meq/L (ref 135–145)

## 2007-04-10 ENCOUNTER — Ambulatory Visit: Payer: Self-pay | Admitting: *Deleted

## 2007-04-27 ENCOUNTER — Ambulatory Visit: Payer: Self-pay | Admitting: Hospitalist

## 2007-05-21 ENCOUNTER — Telehealth (INDEPENDENT_AMBULATORY_CARE_PROVIDER_SITE_OTHER): Payer: Self-pay | Admitting: Internal Medicine

## 2007-06-02 ENCOUNTER — Encounter: Admission: RE | Admit: 2007-06-02 | Discharge: 2007-06-02 | Payer: Self-pay | Admitting: Internal Medicine

## 2007-06-02 ENCOUNTER — Ambulatory Visit: Payer: Self-pay | Admitting: Thoracic Surgery (Cardiothoracic Vascular Surgery)

## 2007-07-07 ENCOUNTER — Ambulatory Visit: Payer: Self-pay | Admitting: Internal Medicine

## 2007-07-29 LAB — CBC WITH DIFFERENTIAL/PLATELET
BASO%: 0.4 % (ref 0.0–2.0)
EOS%: 2.5 % (ref 0.0–7.0)
HCT: 41.6 % (ref 38.7–49.9)
LYMPH%: 24.3 % (ref 14.0–48.0)
MCH: 30.3 pg (ref 28.0–33.4)
MCHC: 32.4 g/dL (ref 32.0–35.9)
MCV: 93.6 fL (ref 81.6–98.0)
MONO%: 6 % (ref 0.0–13.0)
NEUT%: 66.8 % (ref 40.0–75.0)
Platelets: 230 10*3/uL (ref 145–400)
RBC: 4.45 10*6/uL (ref 4.20–5.71)
WBC: 7.3 10*3/uL (ref 4.0–10.0)

## 2007-07-29 LAB — COMPREHENSIVE METABOLIC PANEL
ALT: 10 U/L (ref 0–53)
Alkaline Phosphatase: 102 U/L (ref 39–117)
CO2: 23 mEq/L (ref 19–32)
Creatinine, Ser: 1.27 mg/dL (ref 0.40–1.50)
Sodium: 140 mEq/L (ref 135–145)
Total Bilirubin: 0.4 mg/dL (ref 0.3–1.2)
Total Protein: 7.5 g/dL (ref 6.0–8.3)

## 2007-08-05 ENCOUNTER — Encounter (INDEPENDENT_AMBULATORY_CARE_PROVIDER_SITE_OTHER): Payer: Self-pay | Admitting: Internal Medicine

## 2007-08-05 ENCOUNTER — Ambulatory Visit: Payer: Self-pay | Admitting: Internal Medicine

## 2007-08-07 ENCOUNTER — Encounter (INDEPENDENT_AMBULATORY_CARE_PROVIDER_SITE_OTHER): Payer: Self-pay | Admitting: *Deleted

## 2007-08-07 ENCOUNTER — Ambulatory Visit: Payer: Self-pay | Admitting: Internal Medicine

## 2007-08-07 DIAGNOSIS — N182 Chronic kidney disease, stage 2 (mild): Secondary | ICD-10-CM | POA: Insufficient documentation

## 2007-08-07 DIAGNOSIS — N189 Chronic kidney disease, unspecified: Secondary | ICD-10-CM

## 2007-08-07 LAB — CONVERTED CEMR LAB
CO2: 22 meq/L (ref 19–32)
Chloride: 107 meq/L (ref 96–112)
Creatinine, Ser: 1.38 mg/dL (ref 0.40–1.50)

## 2007-10-06 ENCOUNTER — Encounter
Admission: RE | Admit: 2007-10-06 | Discharge: 2007-10-06 | Payer: Self-pay | Admitting: Thoracic Surgery (Cardiothoracic Vascular Surgery)

## 2007-10-06 ENCOUNTER — Ambulatory Visit: Payer: Self-pay | Admitting: Thoracic Surgery (Cardiothoracic Vascular Surgery)

## 2007-10-08 ENCOUNTER — Ambulatory Visit: Payer: Self-pay | Admitting: Internal Medicine

## 2007-10-08 ENCOUNTER — Encounter: Payer: Self-pay | Admitting: *Deleted

## 2007-10-08 ENCOUNTER — Encounter (INDEPENDENT_AMBULATORY_CARE_PROVIDER_SITE_OTHER): Payer: Self-pay | Admitting: Internal Medicine

## 2007-10-08 LAB — CONVERTED CEMR LAB
BUN: 17 mg/dL (ref 6–23)
CO2: 23 meq/L (ref 19–32)
Calcium: 9.2 mg/dL (ref 8.4–10.5)
Chloride: 104 meq/L (ref 96–112)
Cholesterol: 160 mg/dL (ref 0–200)
Creatinine, Ser: 1.35 mg/dL (ref 0.40–1.50)
HDL: 54 mg/dL (ref >39)
Total CHOL/HDL Ratio: 3
Triglycerides: 42 mg/dL (ref <150)

## 2008-01-22 ENCOUNTER — Ambulatory Visit: Payer: Self-pay | Admitting: Internal Medicine

## 2008-01-26 ENCOUNTER — Ambulatory Visit (HOSPITAL_COMMUNITY): Admission: RE | Admit: 2008-01-26 | Discharge: 2008-01-26 | Payer: Self-pay | Admitting: Internal Medicine

## 2008-02-02 ENCOUNTER — Encounter: Payer: Self-pay | Admitting: Internal Medicine

## 2008-02-02 ENCOUNTER — Encounter (INDEPENDENT_AMBULATORY_CARE_PROVIDER_SITE_OTHER): Payer: Self-pay | Admitting: Internal Medicine

## 2008-02-23 ENCOUNTER — Ambulatory Visit: Payer: Self-pay | Admitting: Thoracic Surgery (Cardiothoracic Vascular Surgery)

## 2008-03-23 ENCOUNTER — Telehealth (INDEPENDENT_AMBULATORY_CARE_PROVIDER_SITE_OTHER): Payer: Self-pay | Admitting: Internal Medicine

## 2008-04-06 IMAGING — CR DG ANKLE COMPLETE 3+V*R*
3 series · 3 of 3 positions shown · non-contrast
Comparison: none

CLINICAL DATA: Right foot pain.  No injury.  Pain has been present for one week and does not allow weight-bearing at this time.  
 RIGHT FOOT - 3 VIEW:

[view not recorded (1 of 3)]
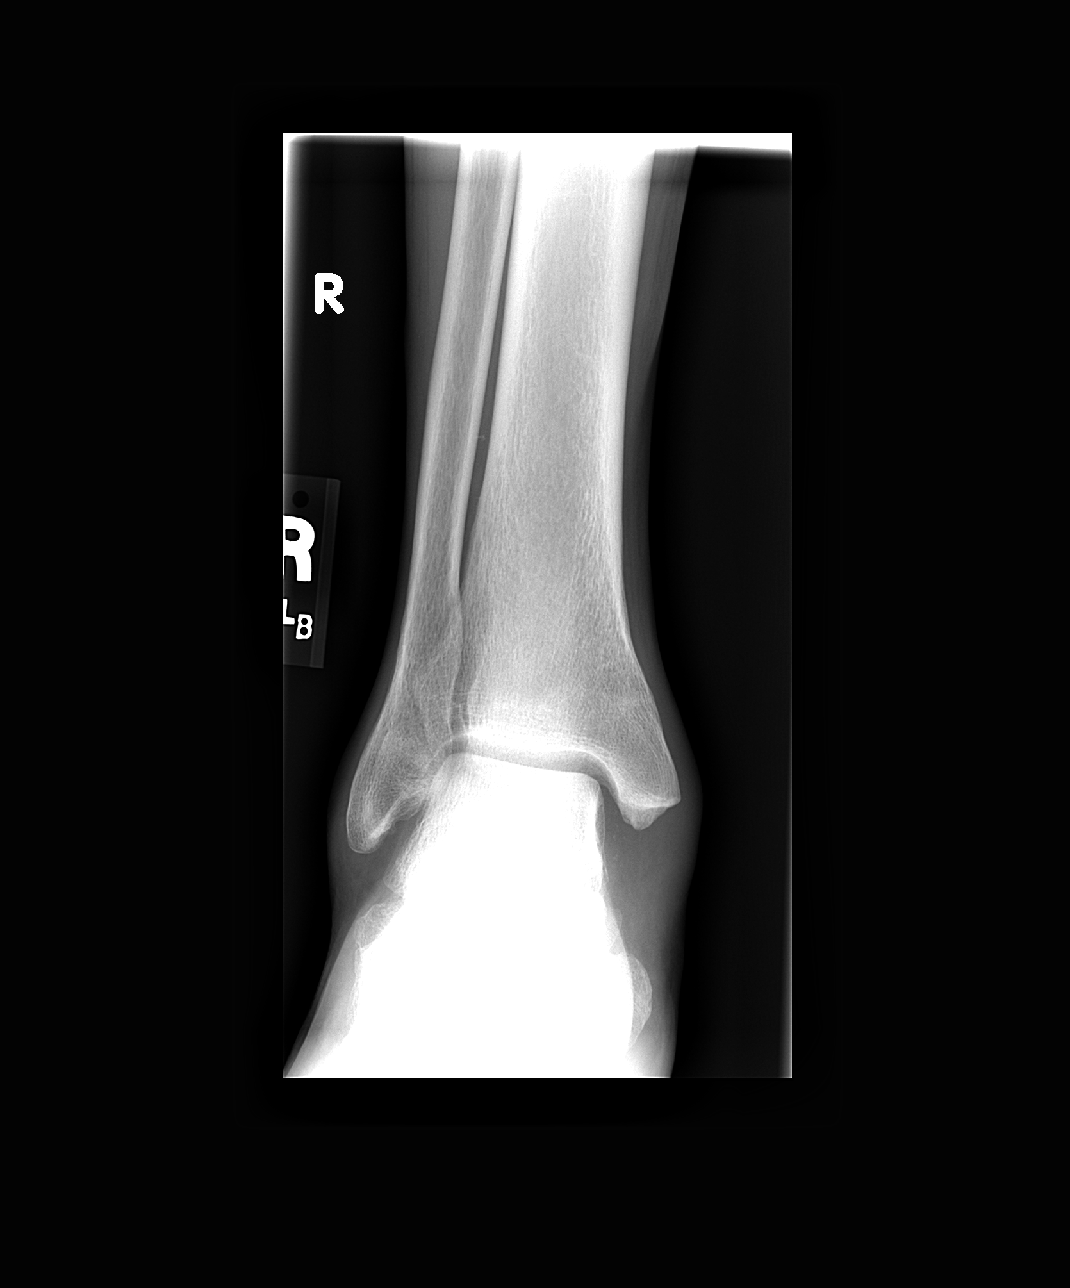

[view not recorded (2 of 3)]
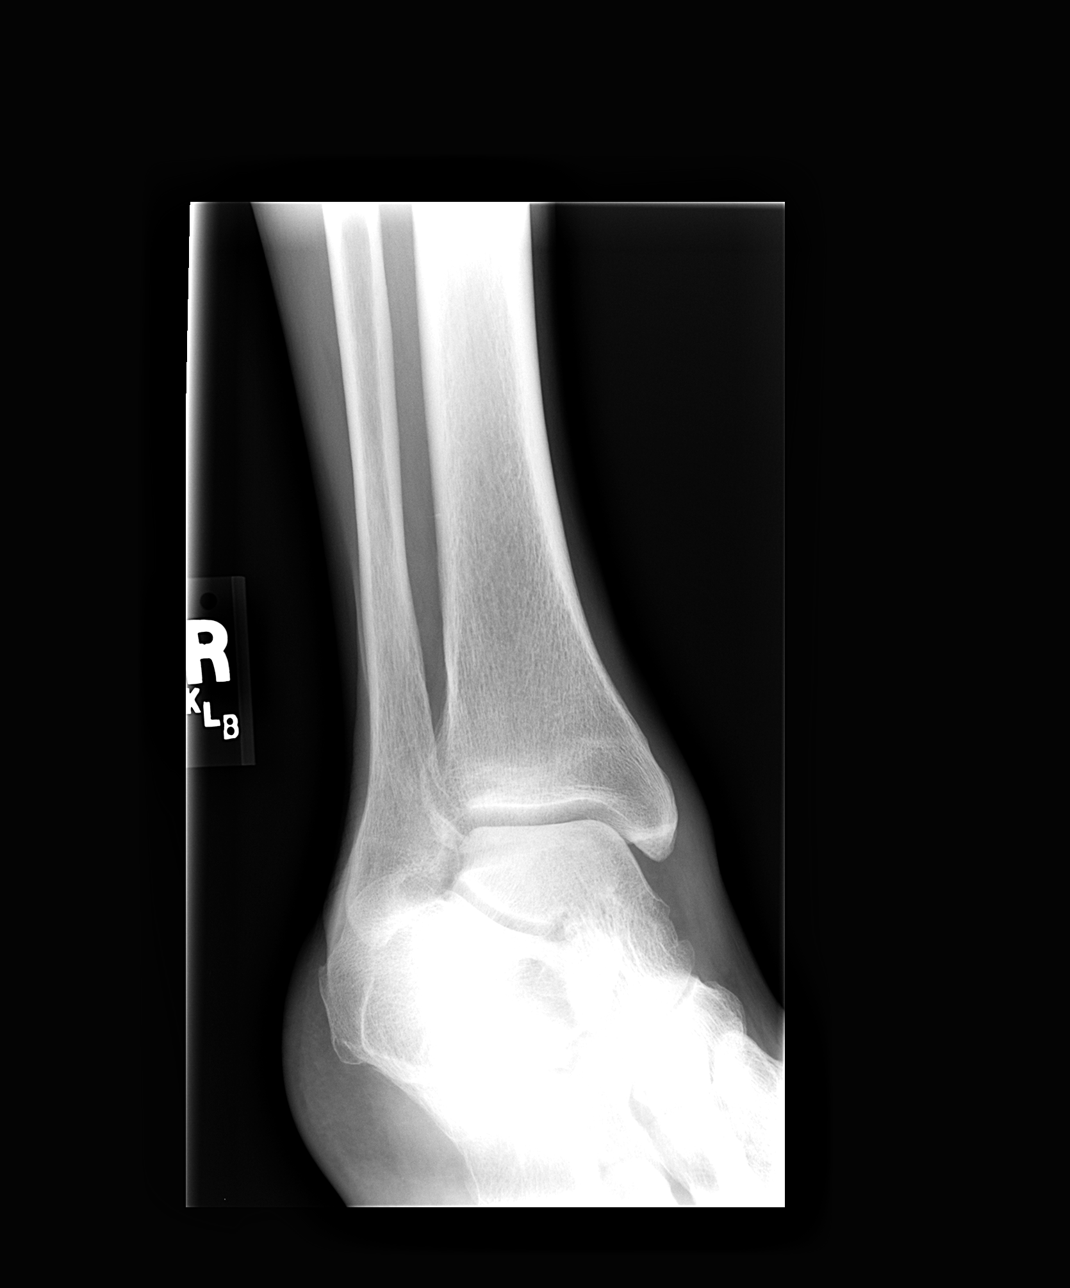

[view not recorded (3 of 3)]
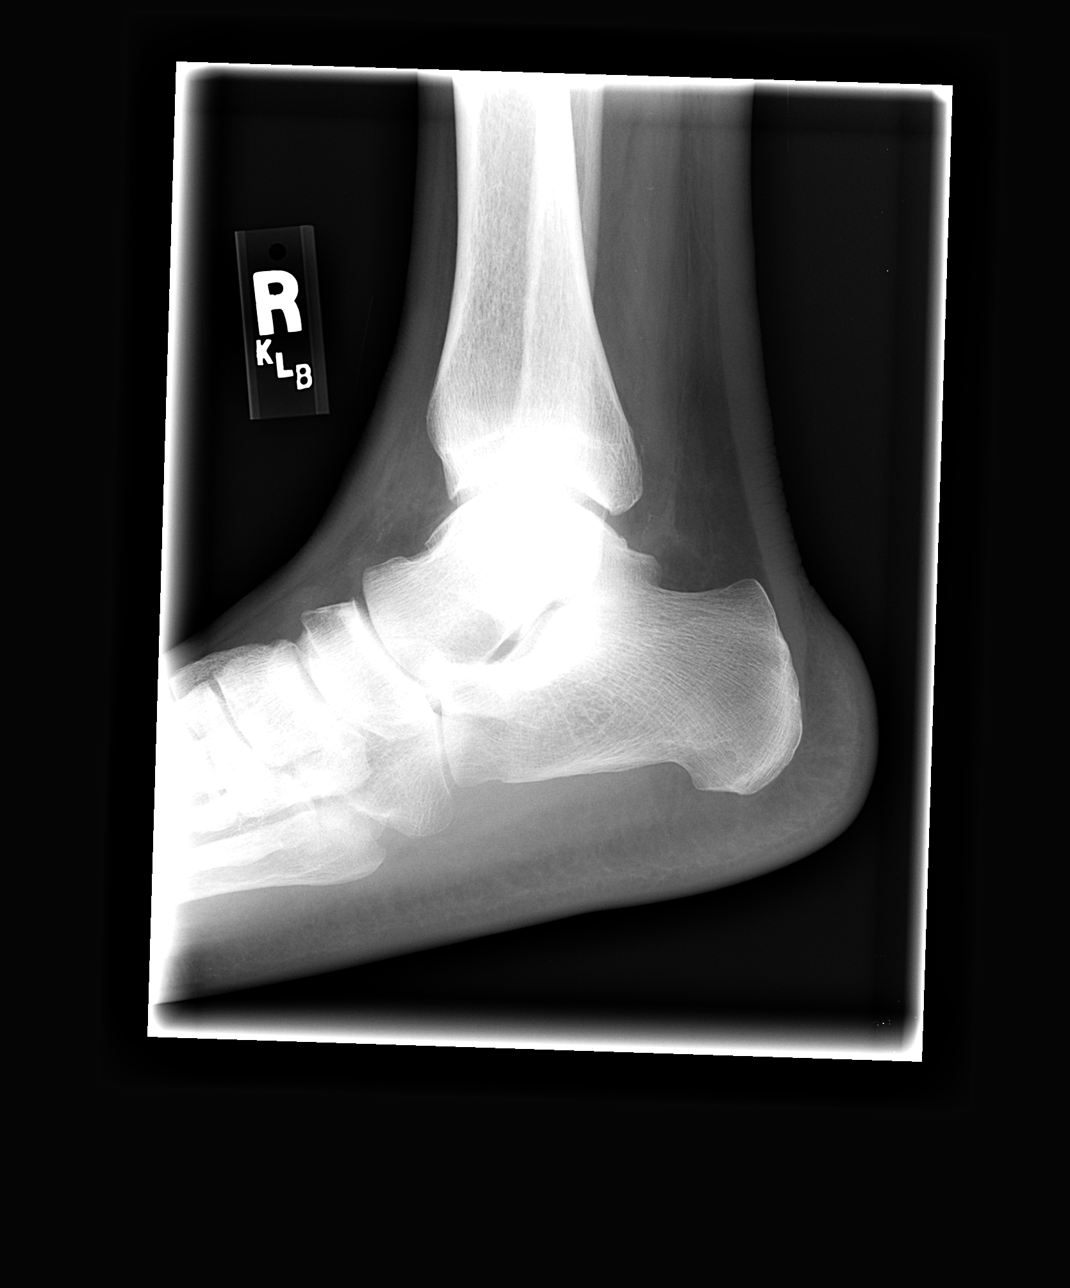

[3 of 3 positions shown; findings below may reference images not displayed]

FINDINGS: Three views of the right foot show soft tissue swelling above the mid and distal foot. There is a moderate-sized bunion formation at the first metatarsal phalangeal joint.  No fracture or dislocation is seen.  There is no evidence of foreign body.
IMPRESSION: 1.  Bunion formation with some soft tissue swelling.  No acute fracture, foreign body.
 2.  The possibility of a non-displaced stress fracture should be considered clinically.  
 RIGHT ANKLE - 3 VIEW:
FINDINGS: AP, oblique, and lateral views of the right ankle are made and show no definite fracture, dislocation or radiopaque foreign body.
IMPRESSION: No fracture, dislocation or foreign body, right ankle.

## 2008-04-06 IMAGING — CR DG FOOT COMPLETE 3+V*R*
3 series · 3 of 3 positions shown · non-contrast
Comparison: none

CLINICAL DATA: Right foot pain.  No injury.  Pain has been present for one week and does not allow weight-bearing at this time.  
 RIGHT FOOT - 3 VIEW:

[view not recorded (1 of 3)]
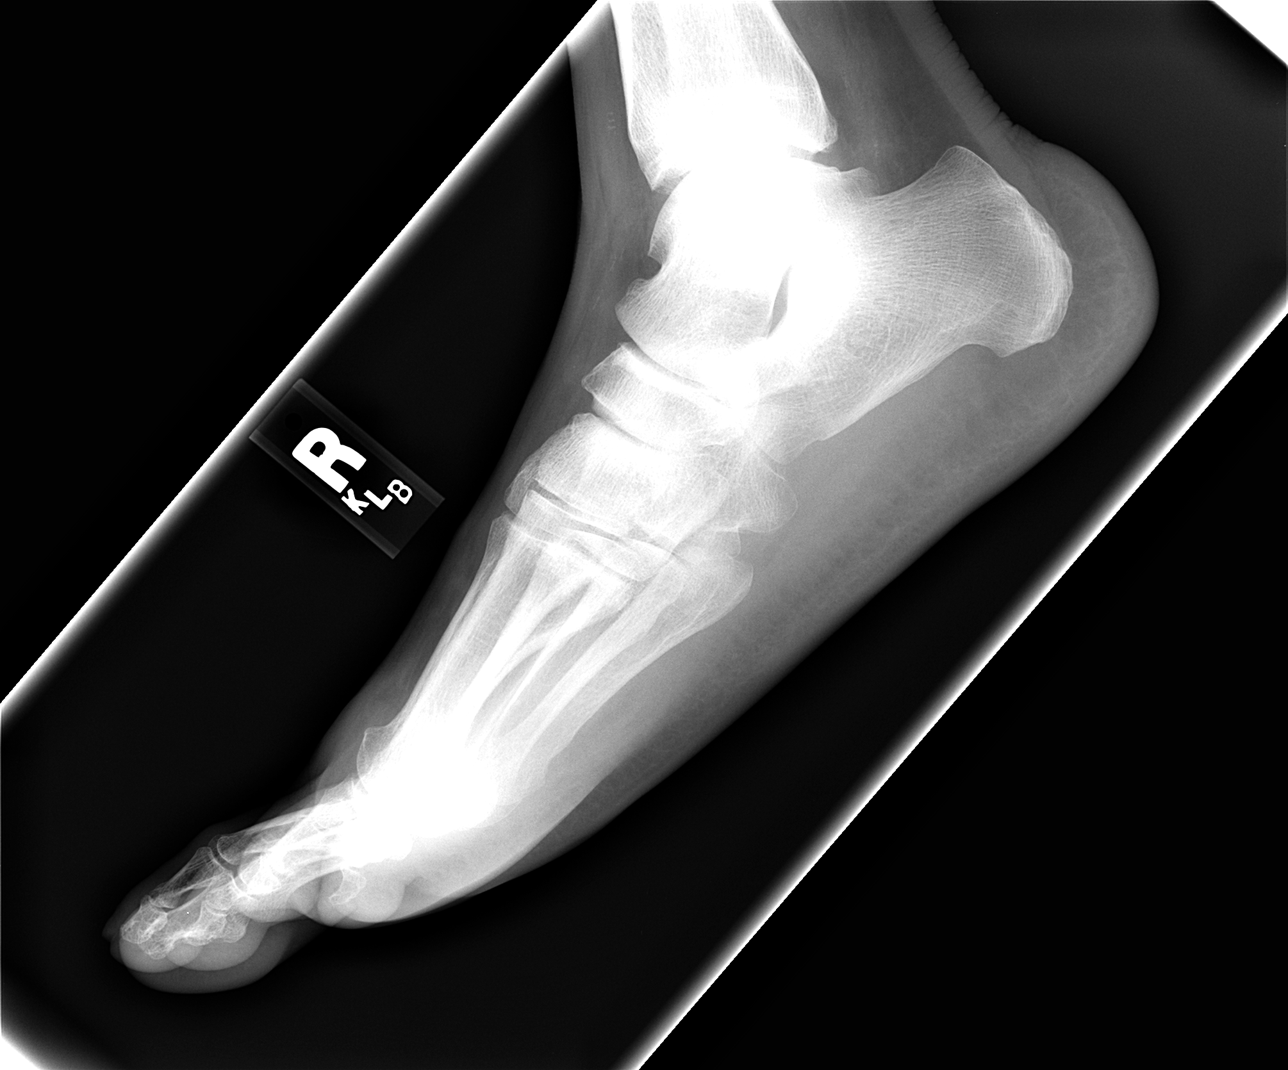

[view not recorded (2 of 3)]
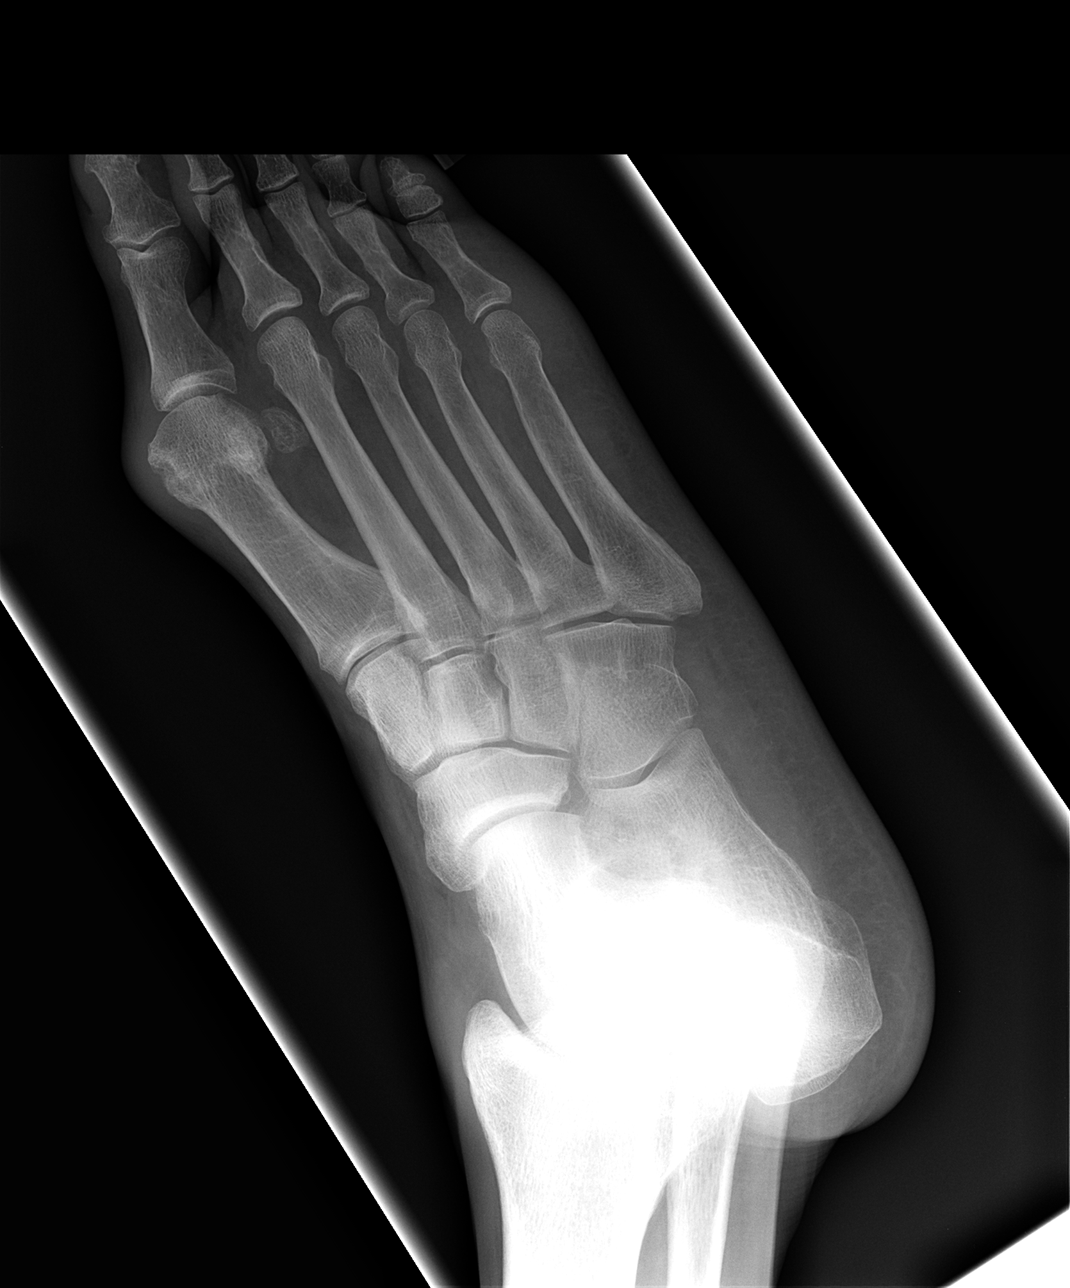

[view not recorded (3 of 3)]
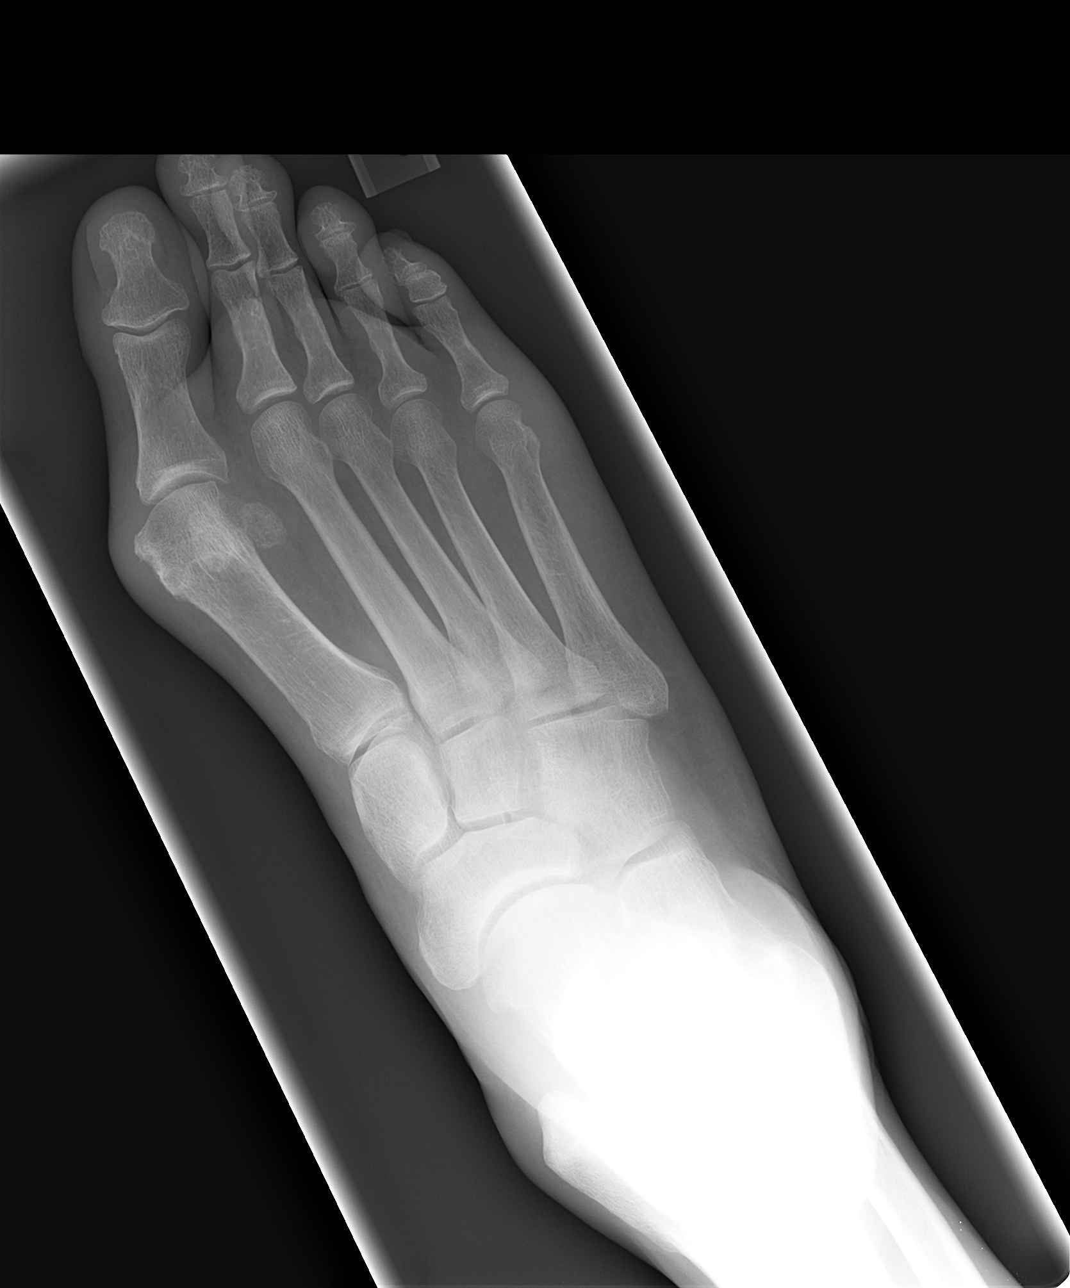

[3 of 3 positions shown; findings below may reference images not displayed]

FINDINGS: Three views of the right foot show soft tissue swelling above the mid and distal foot. There is a moderate-sized bunion formation at the first metatarsal phalangeal joint.  No fracture or dislocation is seen.  There is no evidence of foreign body.
IMPRESSION: 1.  Bunion formation with some soft tissue swelling.  No acute fracture, foreign body.
 2.  The possibility of a non-displaced stress fracture should be considered clinically.  
 RIGHT ANKLE - 3 VIEW:
FINDINGS: AP, oblique, and lateral views of the right ankle are made and show no definite fracture, dislocation or radiopaque foreign body.
IMPRESSION: No fracture, dislocation or foreign body, right ankle.

## 2008-04-08 ENCOUNTER — Emergency Department (HOSPITAL_COMMUNITY): Admission: EM | Admit: 2008-04-08 | Discharge: 2008-04-08 | Payer: Self-pay | Admitting: Emergency Medicine

## 2008-04-11 ENCOUNTER — Emergency Department (HOSPITAL_COMMUNITY): Admission: EM | Admit: 2008-04-11 | Discharge: 2008-04-11 | Payer: Self-pay | Admitting: Emergency Medicine

## 2008-05-02 ENCOUNTER — Ambulatory Visit: Payer: Self-pay | Admitting: Internal Medicine

## 2008-05-02 DIAGNOSIS — M1610 Unilateral primary osteoarthritis, unspecified hip: Secondary | ICD-10-CM

## 2008-05-02 LAB — CONVERTED CEMR LAB
Bilirubin Urine: NEGATIVE
Hemoglobin, Urine: NEGATIVE
Protein, ur: NEGATIVE mg/dL
Urine Glucose: NEGATIVE mg/dL

## 2008-07-26 ENCOUNTER — Ambulatory Visit: Payer: Self-pay | Admitting: Internal Medicine

## 2008-07-28 ENCOUNTER — Ambulatory Visit (HOSPITAL_COMMUNITY): Admission: RE | Admit: 2008-07-28 | Discharge: 2008-07-28 | Payer: Self-pay | Admitting: Internal Medicine

## 2008-08-04 ENCOUNTER — Encounter: Payer: Self-pay | Admitting: Internal Medicine

## 2008-08-04 ENCOUNTER — Encounter (INDEPENDENT_AMBULATORY_CARE_PROVIDER_SITE_OTHER): Payer: Self-pay | Admitting: Internal Medicine

## 2008-08-11 ENCOUNTER — Ambulatory Visit: Payer: Self-pay | Admitting: Thoracic Surgery (Cardiothoracic Vascular Surgery)

## 2008-09-08 IMAGING — CR DG CHEST 1V PORT
1 series · 1 of 1 positions shown · non-contrast
Comparison: 12/12/2003

CLINICAL DATA: Chest pain. Ethanol.

[view not recorded]
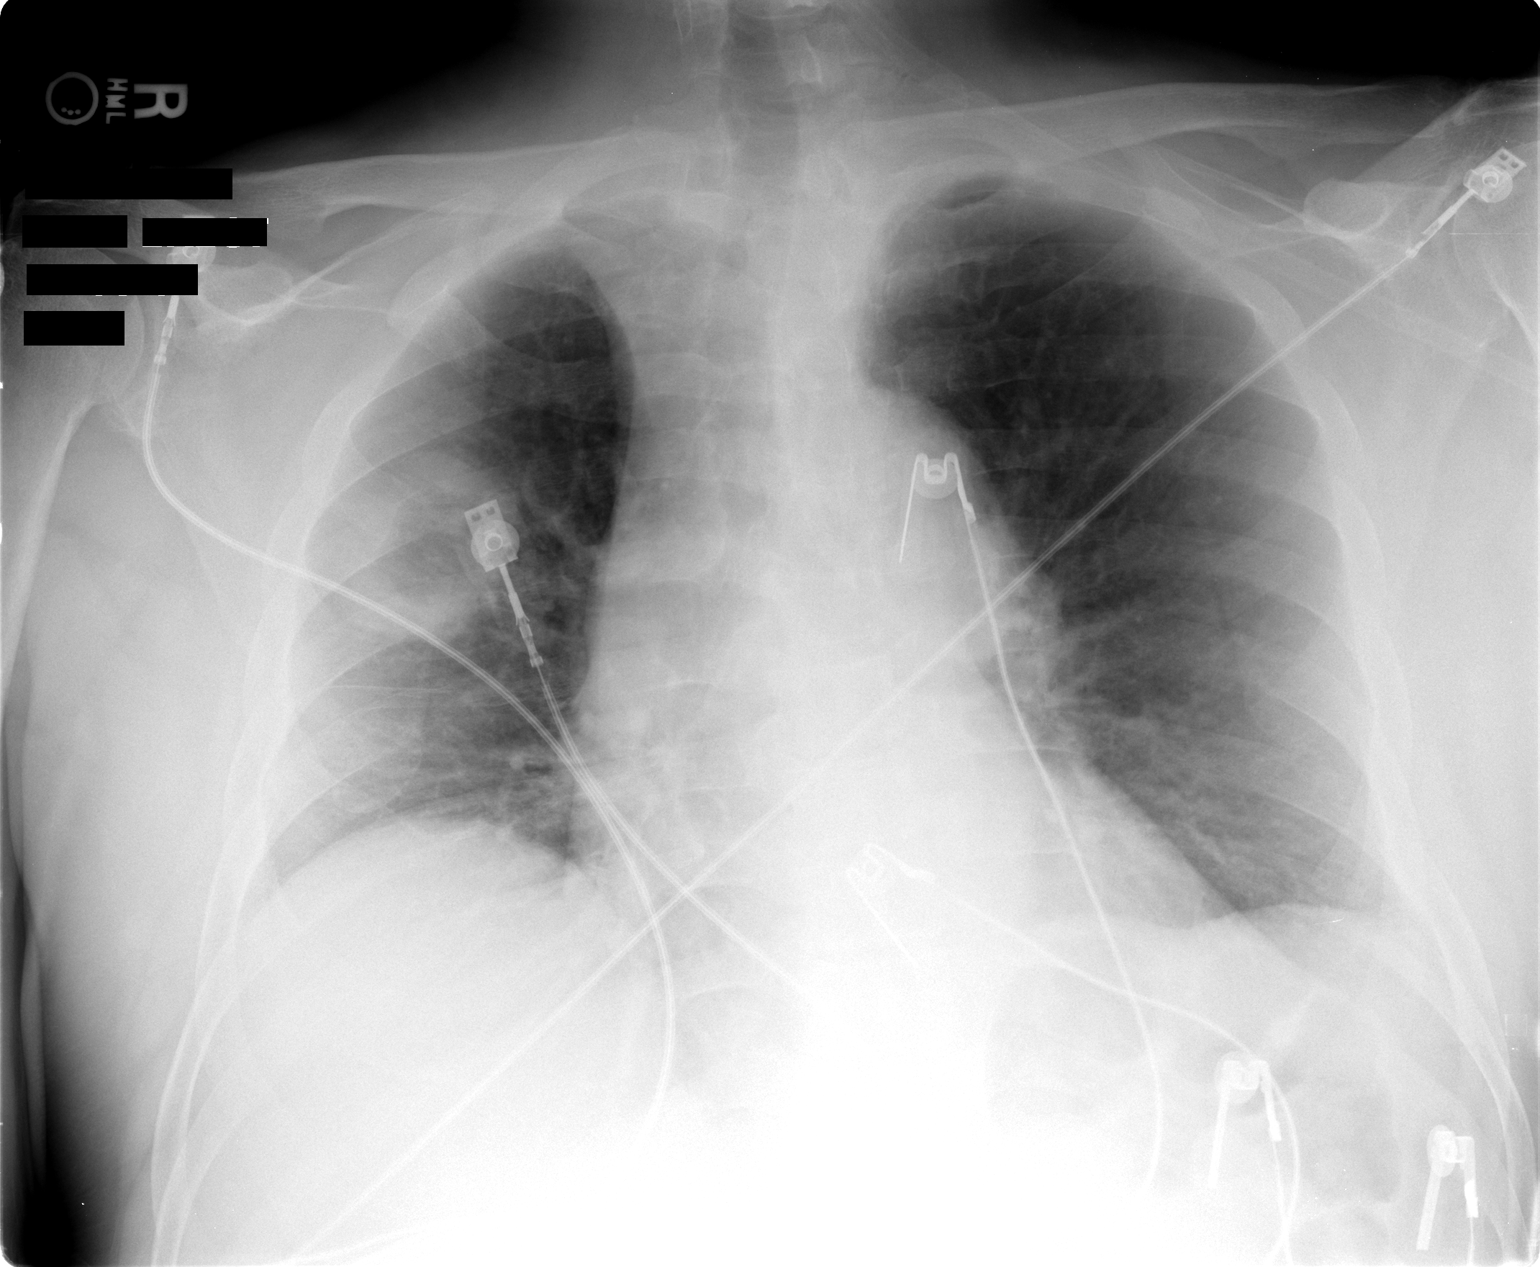

[1 of 1 positions shown; findings below may reference images not displayed]

PORTABLE CHEST - 1 VIEW:

Portal one view chest at 7767 hours. A new 5.2 cm cavitary lesion is identified
in the right midlung. Left lung is clear. Lung volumes are low which further
accentuate the enlarged cardiopericardial silhouette. Telemetry leads overlie
the chest.
IMPRESSION: 5 cm cavitary lesion in the right midlung. Cavitary infection versus neoplasm
are the 2 main differential considerations. Clinical correlation will be
required.

Report was called directly to Dr. Pozisa at the time the study interpretation.

## 2008-09-08 IMAGING — CT CT CHEST W/ CM
2 of 3 series · 15 of 36 positions shown, 18 images · IV contrast (APPLIED)
Comparison: None

CLINICAL DATA: Lung mass
TECHNIQUE: Multidetector CT imaging of the chest was performed following the
standard protocol during bolus administration of intravenous contrast.

[Series 2: routine chest 5.0 st · axial · 0.72mm/px · z∈[-310,-35]mm · 12 of 65 slices shown, 15 images]
[im 5/65  mediastinal]
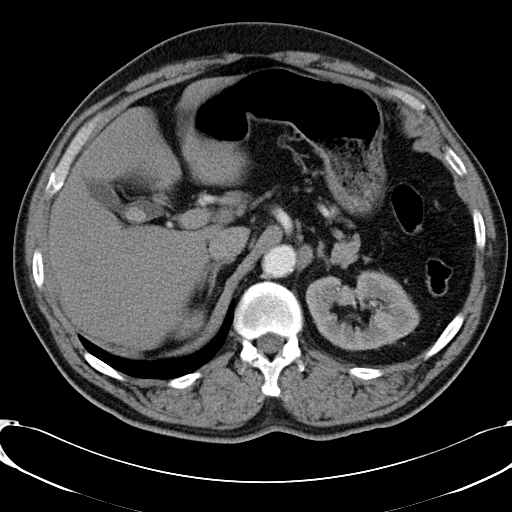
[im 5/65  lung]
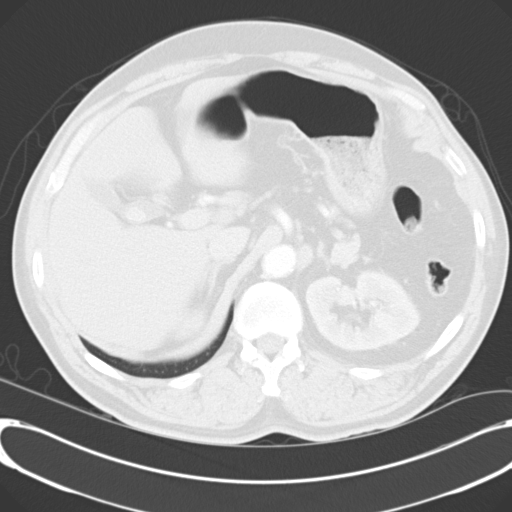
[im 10/65  lung]
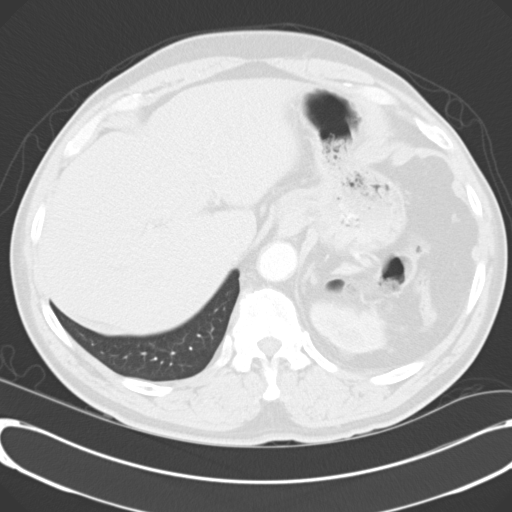
[im 15/65  lung]
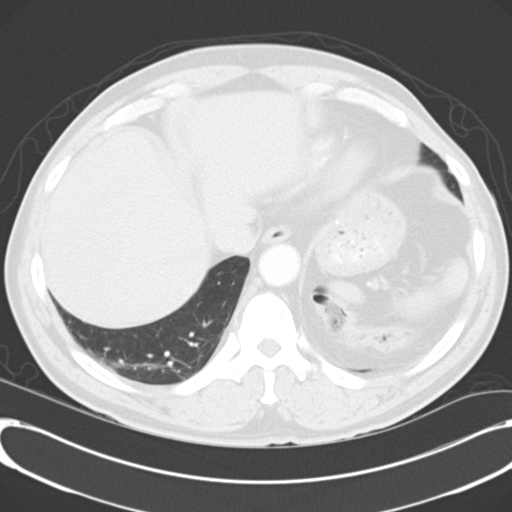
[im 19/65  lung]
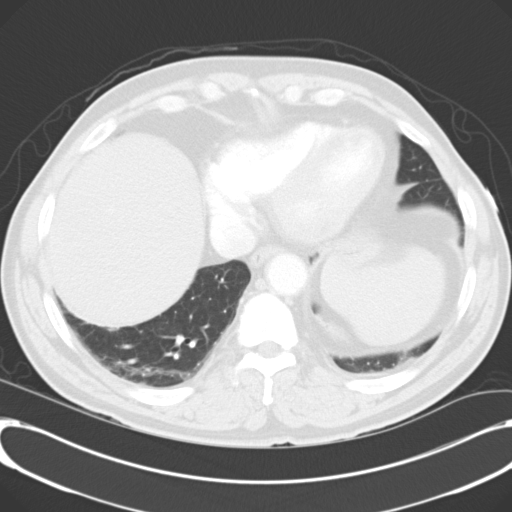
[im 24/65  mediastinal]
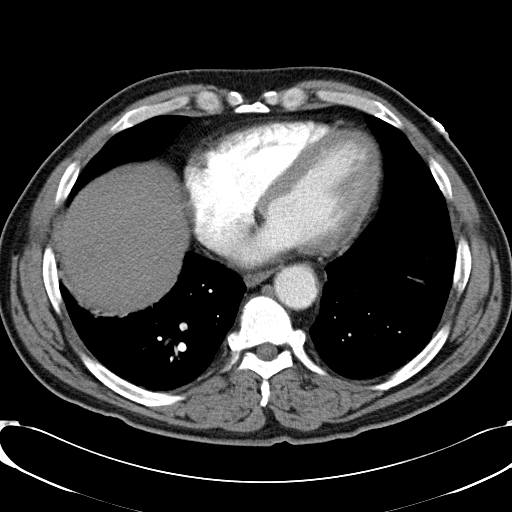
[im 24/65  lung]
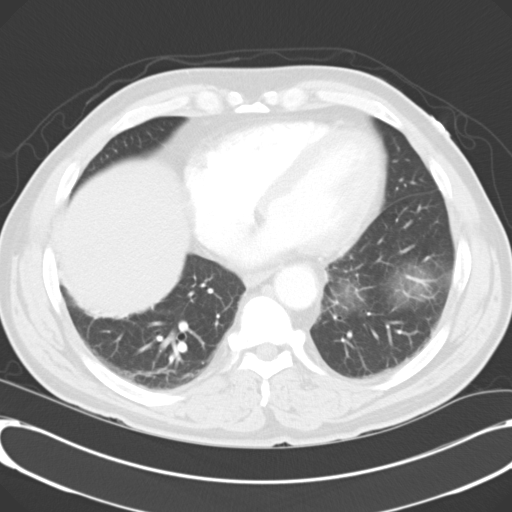
[im 29/65  lung]
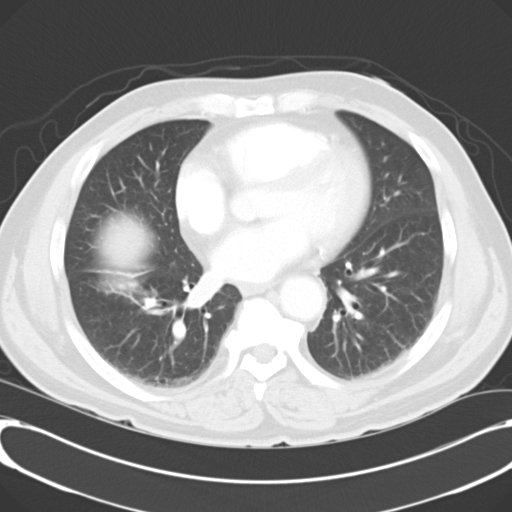
[im 36/65  lung]
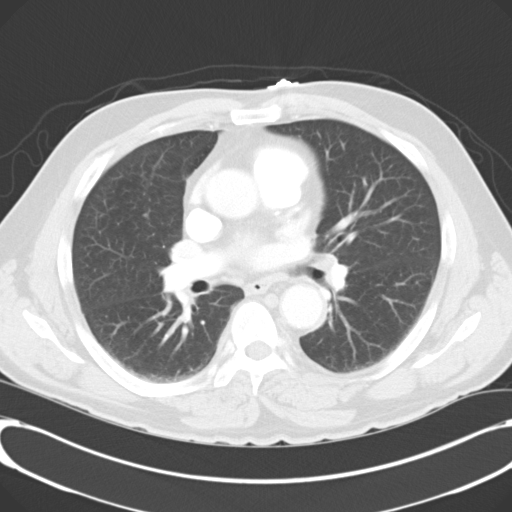
[im 41/65  lung]
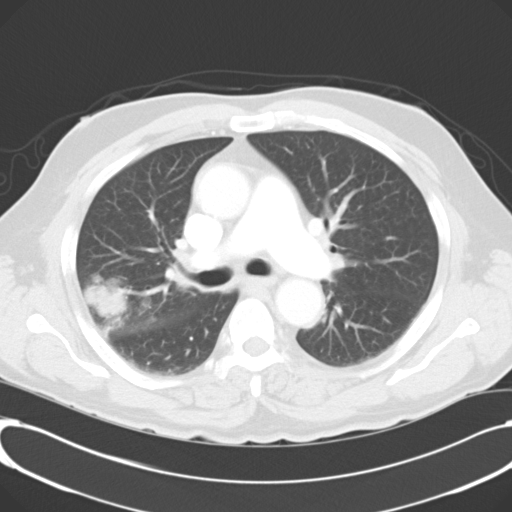
[im 46/65  mediastinal]
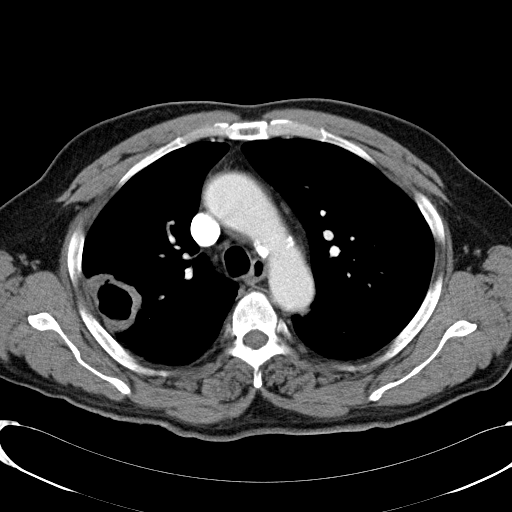
[im 46/65  lung]
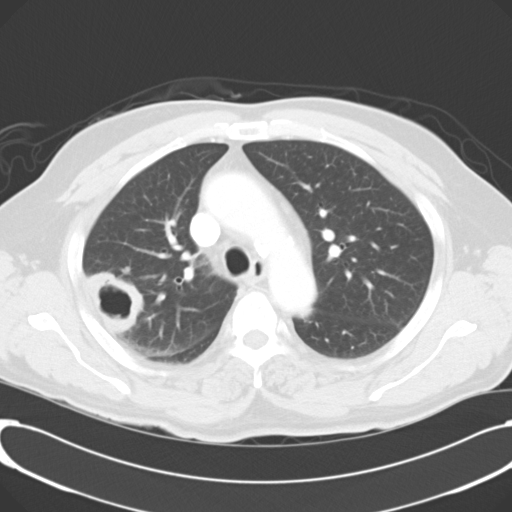
[im 50/65  lung]
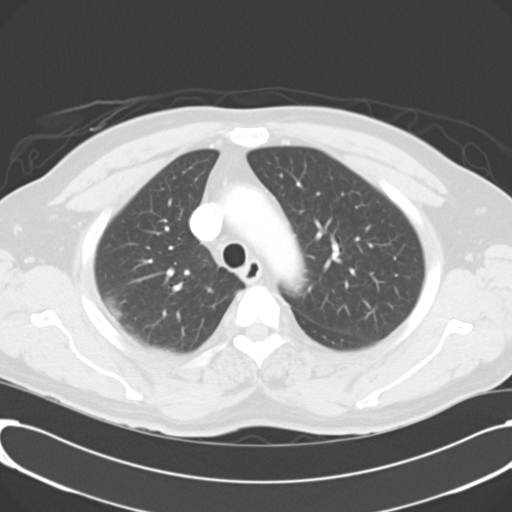
[im 55/65  lung]
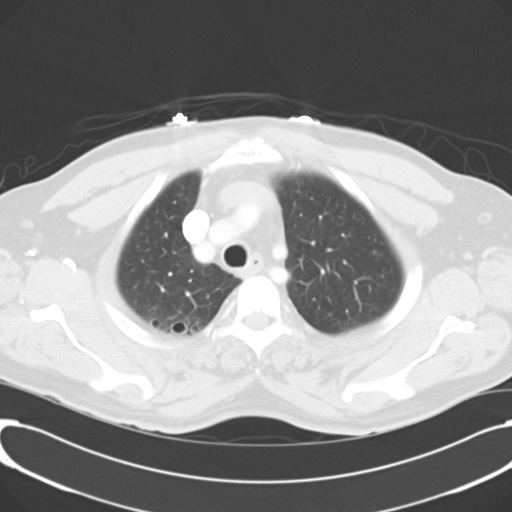
[im 60/65  lung]
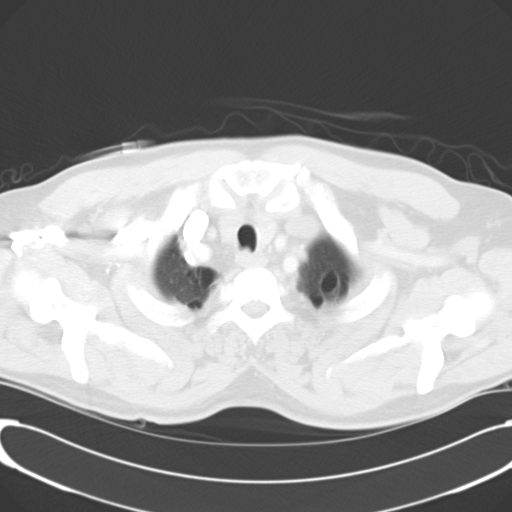

[Series 4: routine chest 5.0 coronal · coronal · 0.72mm/px · 3 of 47 slices shown]
[im 10/47  lung]
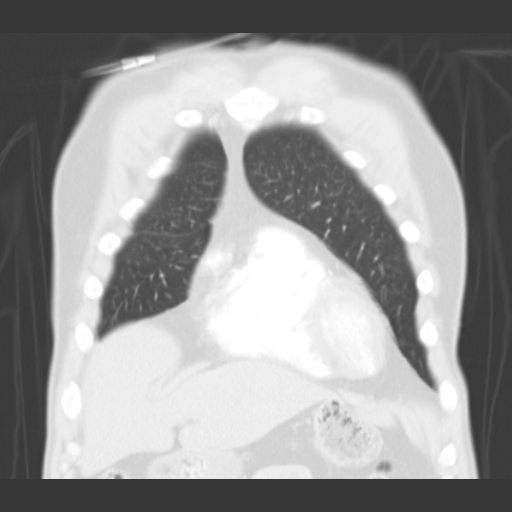
[im 19/47  lung]
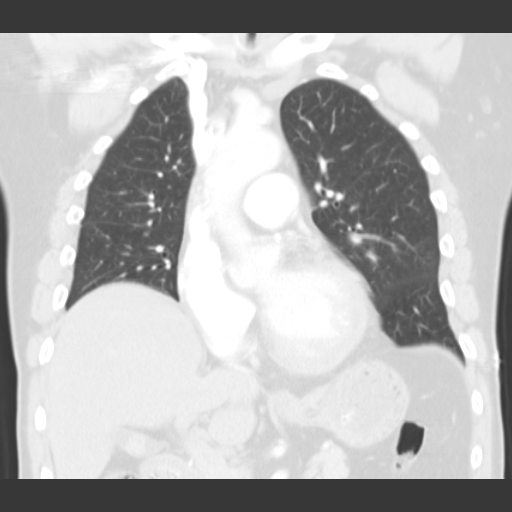
[im 28/47  lung]
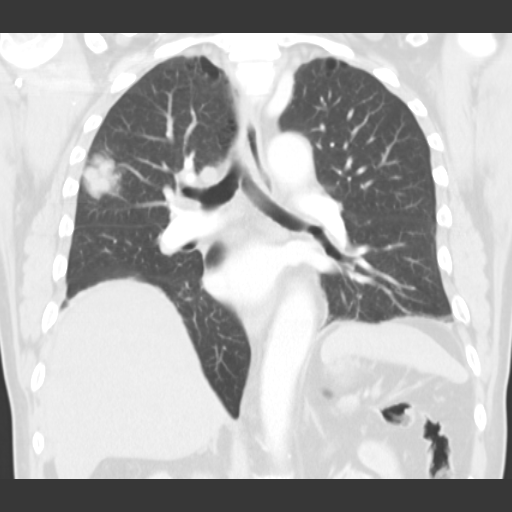

[15 of 36 positions shown; findings below may reference images not displayed]

Contrast:  80 cc Omnipaque 300

CHEST CT WITH CONTRAST:

No evidence for axillary lymphadenopathy. 6 mm short axis prevascular lymph node
is associated with 8mm short axis right hilar lymph node. Coronary artery
calcification noted. No pericardial or pleural effusion. 5.2 cm cavitary mass
identified in the peripheral right upper lobe. This is contiguous with the
peripheral pleura for approximately 5 cm. Several small satellite nodules are
adjacent to the dominant lesion. The wall of the cavitary lesion is fairly
thick, measuring 7 - 8 mm.

Incidental imaging of the upper abdomen demonstrates cholelithiasis. Small
hiatal versus paraesophageal hernia noted at the diaphragmatic hiatus. No
adrenal masses. 2.1 cm exophytic low density lesion the upper pole left kidney
is likely a cyst.
IMPRESSION: 5.2 cm thick-walled cavitary lesion in the right right upper lobe. Given the
wall thickness and adjacent satellite nodules, features are most concerning for
primary bronchogenic neoplasm although cavitary infection cannot be completely
excluded.

Cholelithiasis.

Small hiatal versus paraesophageal hernia.

## 2008-09-13 IMAGING — CT NM PET TUM IMG SKULL BASE T - THIGH
4 series · 25 of 25 positions shown · IV contrast ([ID])
Comparison: None.

CLINICAL DATA: Lung mass.
 FDG PET-CT TUMOR IMAGING (SKULL BASE TO THIGHS):
 Fasting Blood Glucose:  95.
TECHNIQUE: 15.5 mCi F-18 FDG was injected intravenously via the left antecubital fossa.  Full-ring PET imaging was performed from the skull base through the mid-thighs 65 minutes after injection.  CT data was obtained and used for attenuation correction and anatomic localization only.  (This was not acquired as a diagnostic CT examination).

[Series 1: pet ac · axial · 3.3mm · 4.69mm/px · z∈[-870,+0]mm · 9 of 267 slices shown]
[im 1/267]
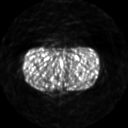
[im 34/267]
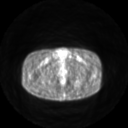
[im 67/267]
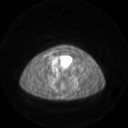
[im 100/267]
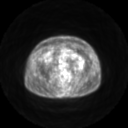
[im 134/267]
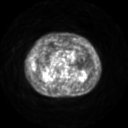
[im 167/267]
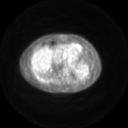
[im 200/267]
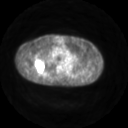
[im 233/267]
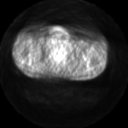
[im 267/267]
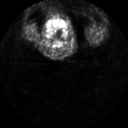

[Series 2: ct images · axial · 3.8mm · 0.98mm/px · z∈[-419,+0]mm · 5 of 129 slices shown]
[im 1/129]
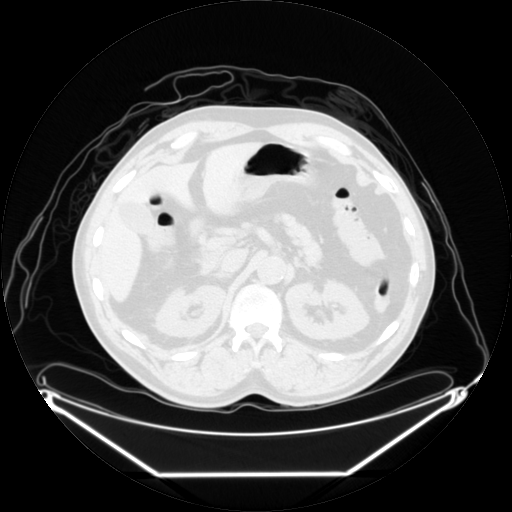
[im 33/129]
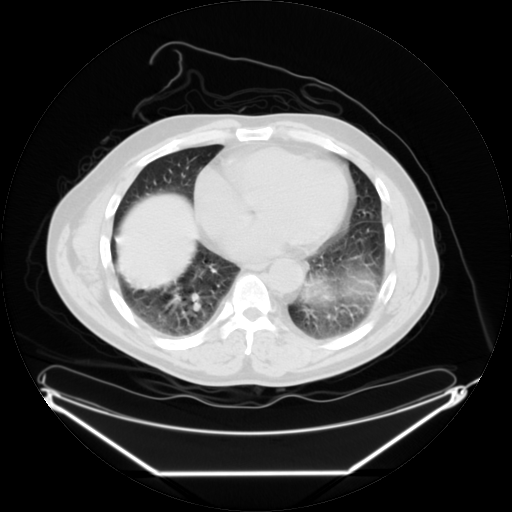
[im 65/129]
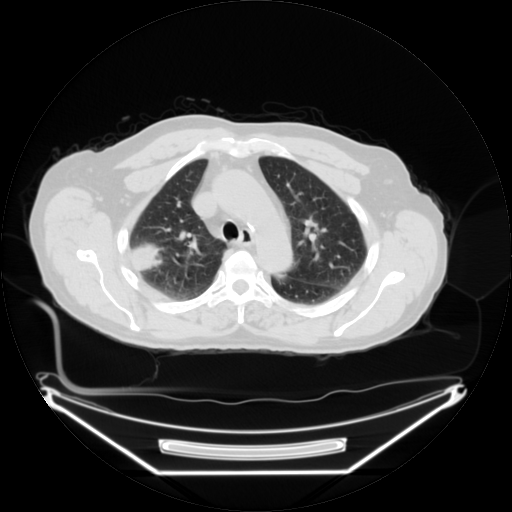
[im 97/129]
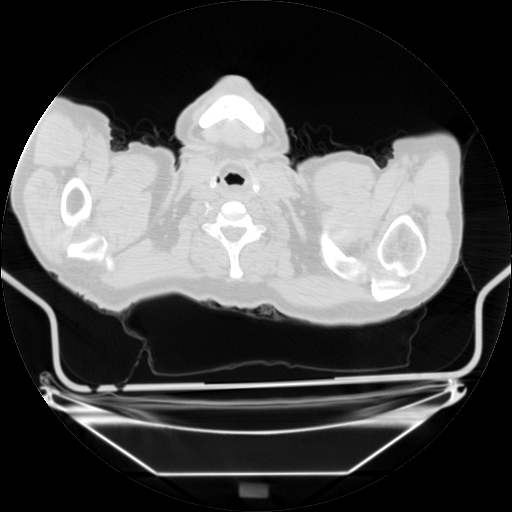
[im 129/129]
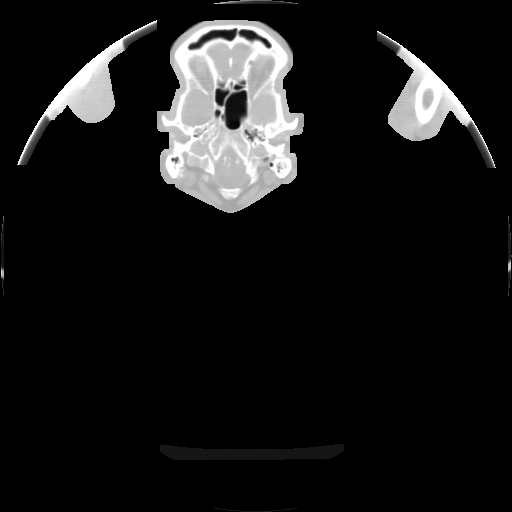

[Series 2: pet nac · axial · 3.3mm · 4.69mm/px · z∈[-870,+0]mm · 10 of 267 slices shown]
[im 1/267]
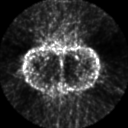
[im 30/267]
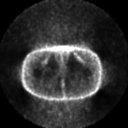
[im 60/267]
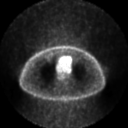
[im 89/267]
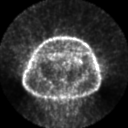
[im 119/267]
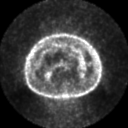
[im 148/267]
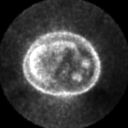
[im 178/267]
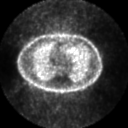
[im 207/267]
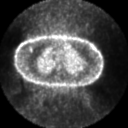
[im 237/267]
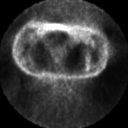
[im 267/267]
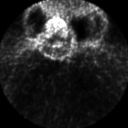

[Series 123: mip · coronal · 3.3mm · 4.69mm/px · 1 of 30 slices shown]
[im 1/30]
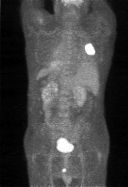

[25 of 25 positions shown; findings below may reference images not displayed]

FINDINGS: No abnormal increased radiotracer uptake is seen within the soft tissues of the neck.
 There are no hypermetabolic axillary lymph nodes.
 No hypermetabolic pretracheal, prevascular, right paratracheal, precarinal, subcarinal, AP window, right hilar, or left hilar adenopathy. There is no pleural or pericardial effusions. There is a necrotic mass within the right upper lobe measuring 5.4 x 4.2 cm and has intense FDG uptake with an SUV max equal to 14.7.
 No additional hypermetabolic pulmonary nodules or masses are noted.
 There are no abnormal foci of increased radiotracer uptake seen within the liver parenchyma.
 The spleen is negative.
 Left adrenal gland negative. Right adrenal gland negative.
 Right kidney is normal. There is a low density lesion arising from upper pole of the left kidney incompletely characterized without intravenous contrast material.  No hypermetabolic retroperitoneal adenopathy. No hypermetabolic small bowel lymph nodes.
 There are no abnormal foci of increased uptake within the pelvis.  No hypermetabolic inguinal lymph nodes.
 Review of the visualized osseous structures shows no abnormal foci of increased radiotracer uptake to suggest bone metastasis.
 There are calcified gallstones. There is increased soft tissue attenuation within the region of the cecum. This is nonspecific and may represent retained stool.  There is no associated increased uptake to suggest this is a colonic neoplasm. Nevertheless, I would recommend correlation with colon cancer screening.
IMPRESSION: 1.  Necrotic right upper lobe mass is intensely hypermetabolic consistent with primary lung cancer.
 2.  No evidence for hypermetabolic hilar or mediastinal lymph node metastasis.
 3.  No evidence for hypermetabolic metastasis to the abdomen or pelvis.
 4.  Increased soft tissue in the cecum.  Recommend correlation with colon cancer screening.

## 2008-09-15 ENCOUNTER — Ambulatory Visit: Payer: Self-pay | Admitting: Internal Medicine

## 2008-09-17 IMAGING — CR DG CHEST 1V PORT
1 series · 1 of 1 positions shown · non-contrast
Comparison: 04/28/2006 at 4642 hours

CLINICAL DATA: VATS and wedge resection. Right lobectomy. Central line
placement.

PORTABLE CHEST - 1 VIEW

[view not recorded]
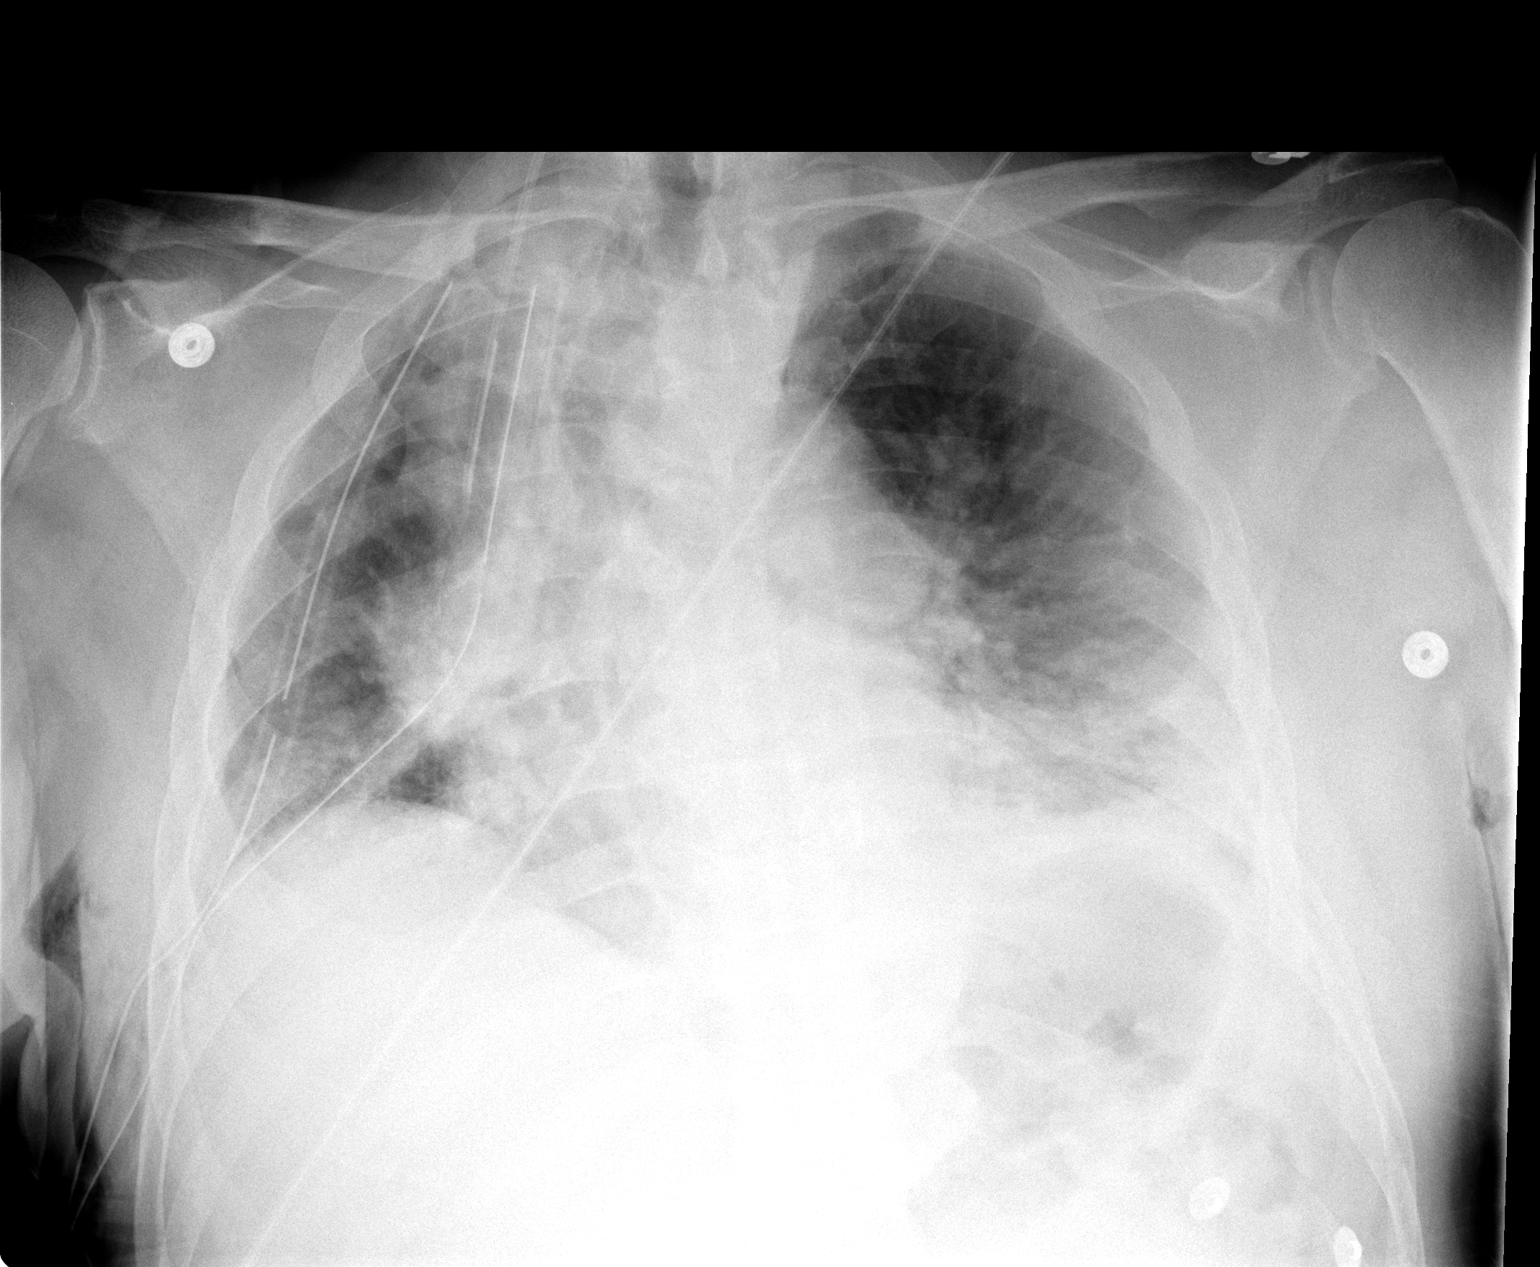

[1 of 1 positions shown; findings below may reference images not displayed]

FINDINGS: Right internal jugular central venous catheter tip projects over the
superior vena cava. Right-sided volume loss noted. Two right-sided chest tubes
are in place.

There is new airspace opacity the left lung base likely reflecting atelectasis.
Paramediastinal density on the right side also likely represents atelectasis.

No discernible pneumothorax is identified.  

IMPRESSION

1. Status post right-sided wedge resection/lobectomy, with volume loss in the
right hemithorax and 2 right-sided chest tubes in place. 
2. Right central venous catheter tip: superior vena cava.
3. Left basilar air space opacity likely reflects atelectasis.

## 2008-09-18 IMAGING — CR DG CHEST 1V PORT
1 series · 1 of 1 positions shown · non-contrast
Comparison: none

CLINICAL DATA: Lung mass, VATS.
 PORTABLE CHEST - 1 VIEW ? 04/29/06 ? 0800 HOURS:
 Comparison made to yesterday?s exam.

[view not recorded]
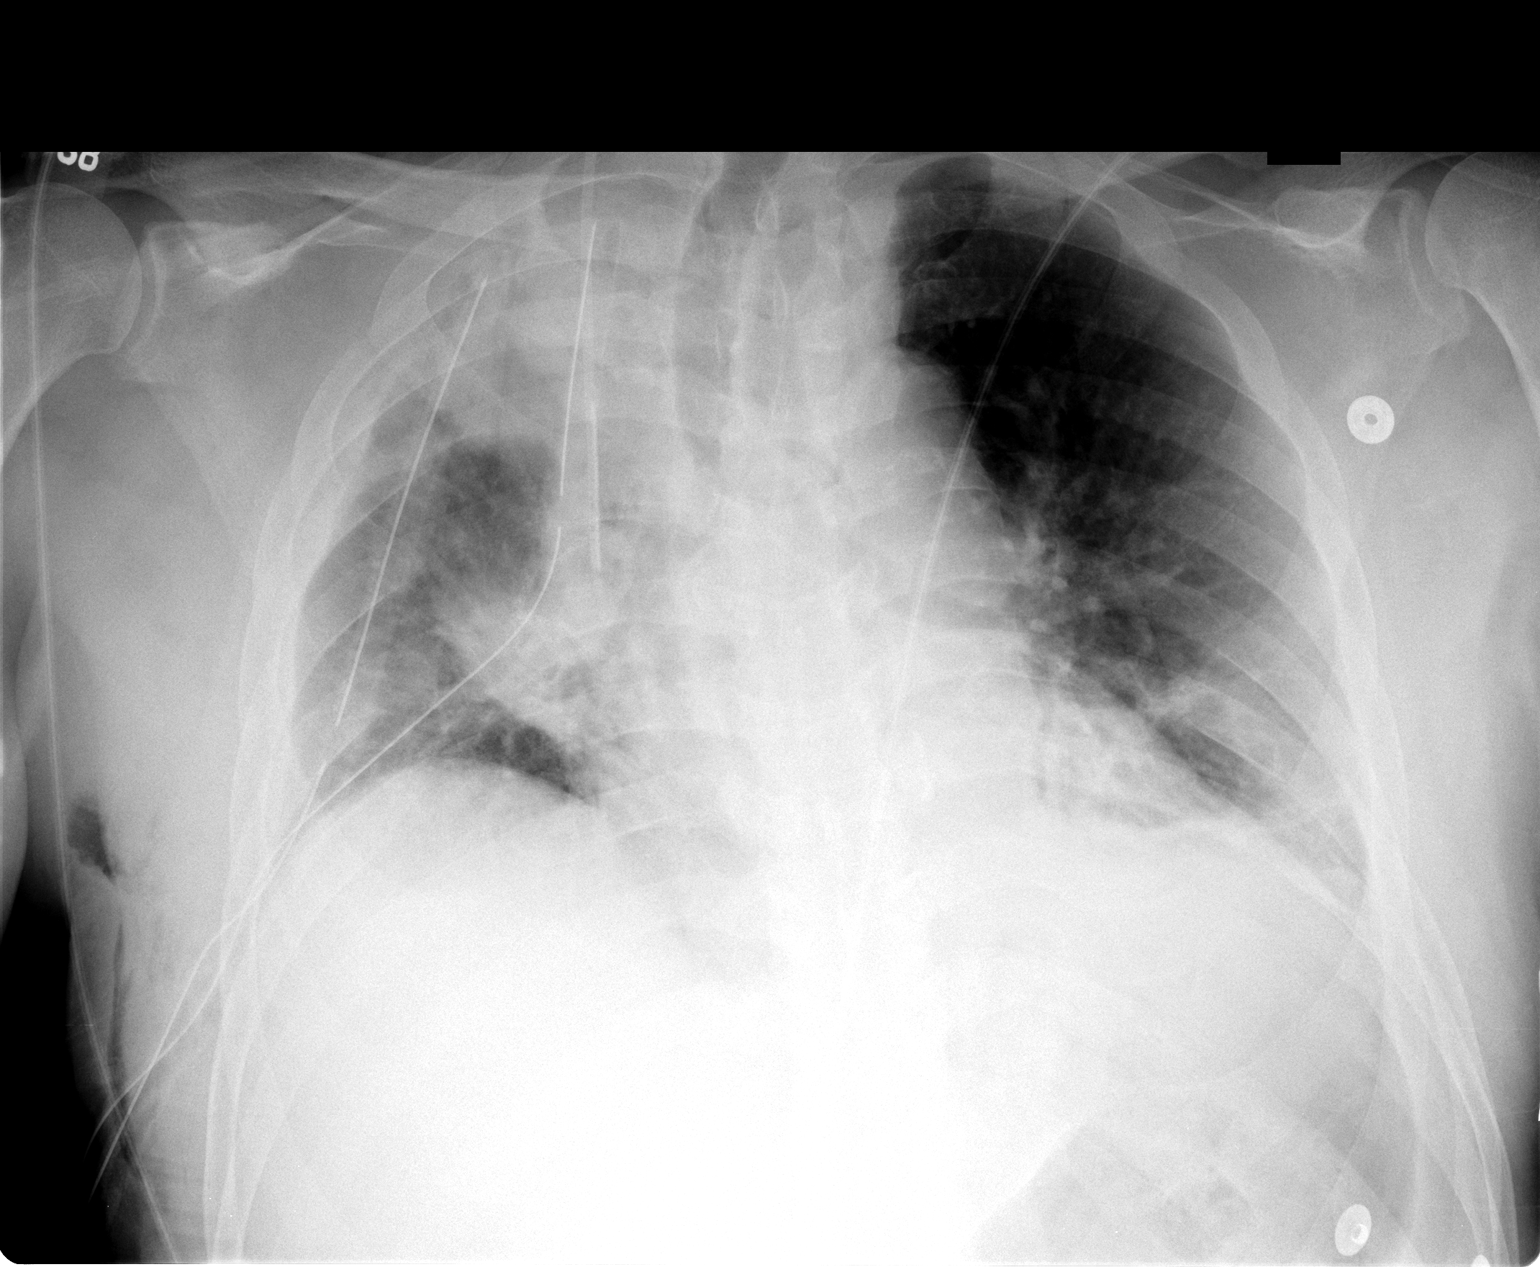

[1 of 1 positions shown; findings below may reference images not displayed]

FINDINGS: Two right pleural chest tubes remain in position.  No pneumothorax.  Overall minimal improved aeration of the lungs.  Subsegmental atelectasis lower lung zones.  Increased density in the right upper lobe/apex.
IMPRESSION: Increased density in the right apex likely representing atelectasis.  Bibasilar atelectatic changes.  No pneumothorax.

## 2008-09-19 IMAGING — CR DG CHEST 1V PORT
1 series · 1 of 1 positions shown · non-contrast
Comparison: 04/29/06.

CLINICAL DATA: Lung mass,  VATS.  Follow-up.
 PORTABLE CHEST - 1 VIEW, 04/30/06 AT 3779 HOURS:

[view not recorded]
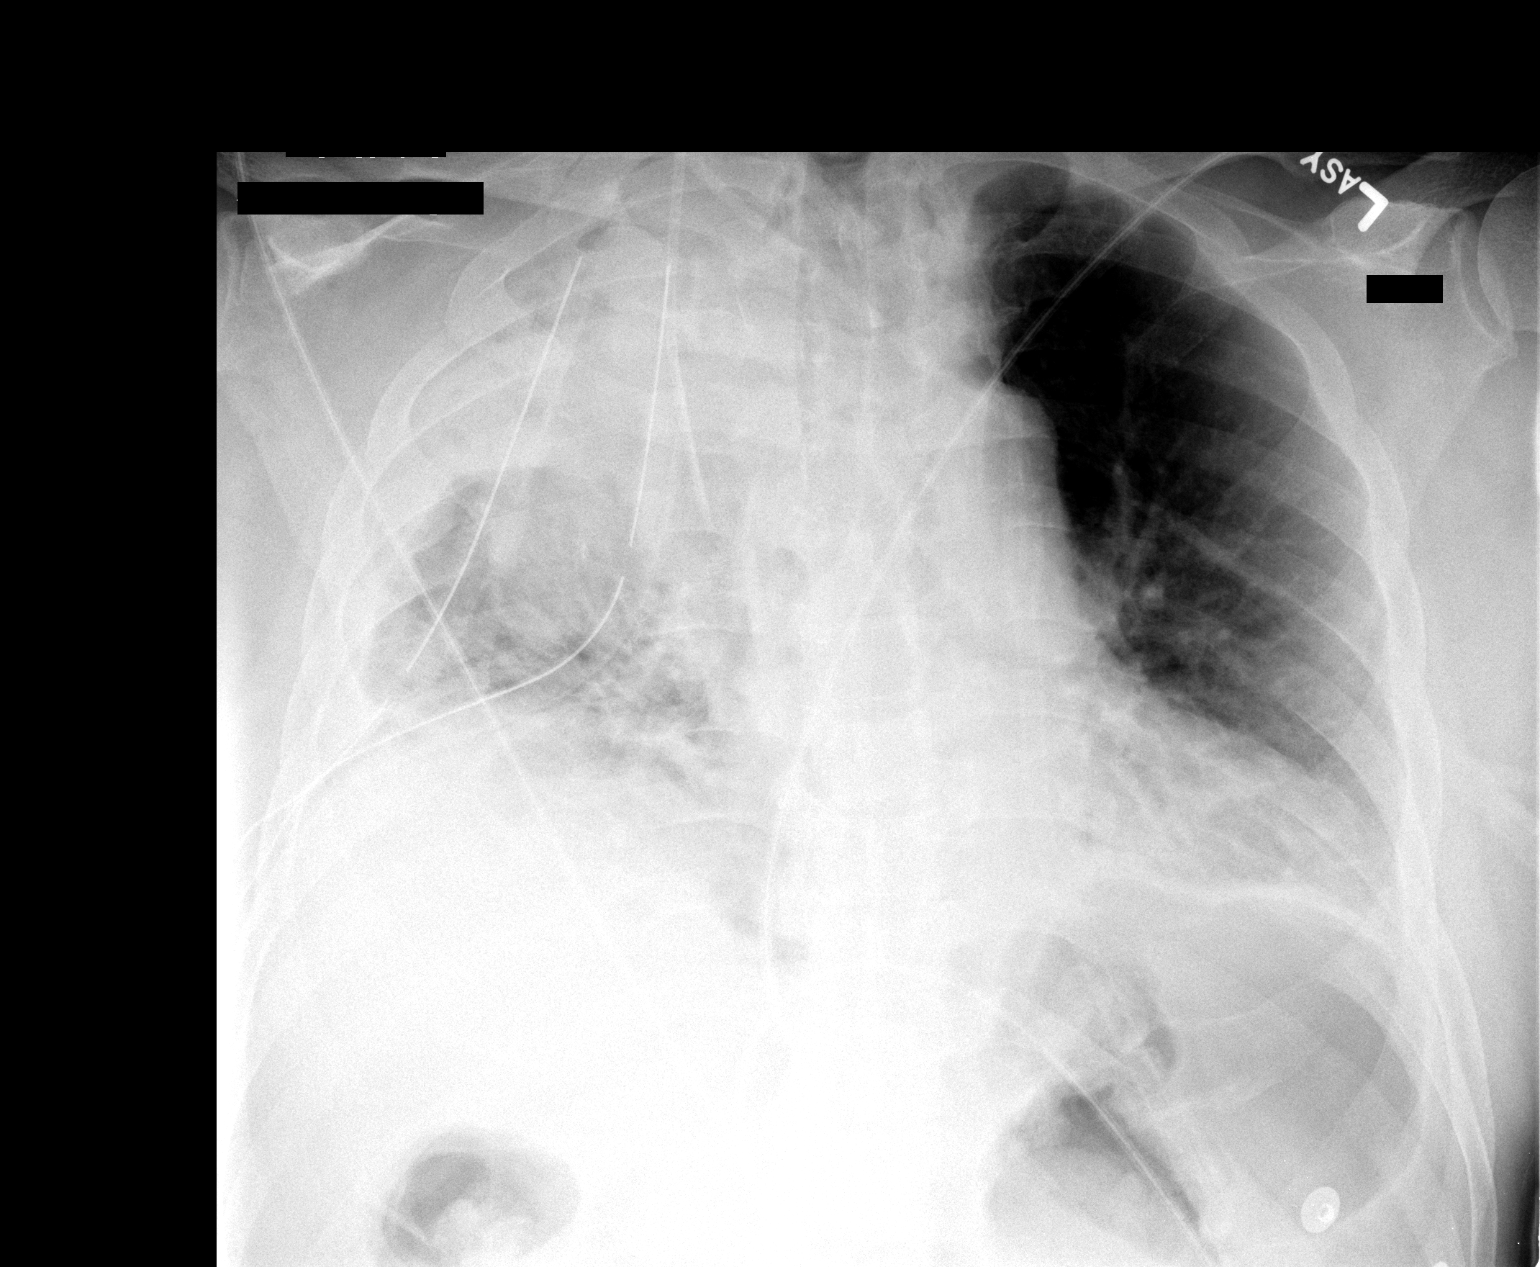

[1 of 1 positions shown; findings below may reference images not displayed]

Two right chest tubes remain and there is little change in pleural and parenchymal opacity on the right.  There has been some decrease in aeration of the right lung base.  Left basilar atelectasis remains.  Right central venous catheter is unchanged.
IMPRESSION: Decreased aeration on the right with little change in right pleural parenchymal opacity and 2 right chest tubes.

## 2008-09-19 IMAGING — CR DG CHEST 1V PORT
1 series · 1 of 1 positions shown · non-contrast
Comparison: none

CLINICAL DATA: Evaluate for pneumothorax.  Chest tube removal. 
 PORTABLE CHEST - 1 VIEW ? 04/30/06 (5751 HOURS):

[view not recorded]
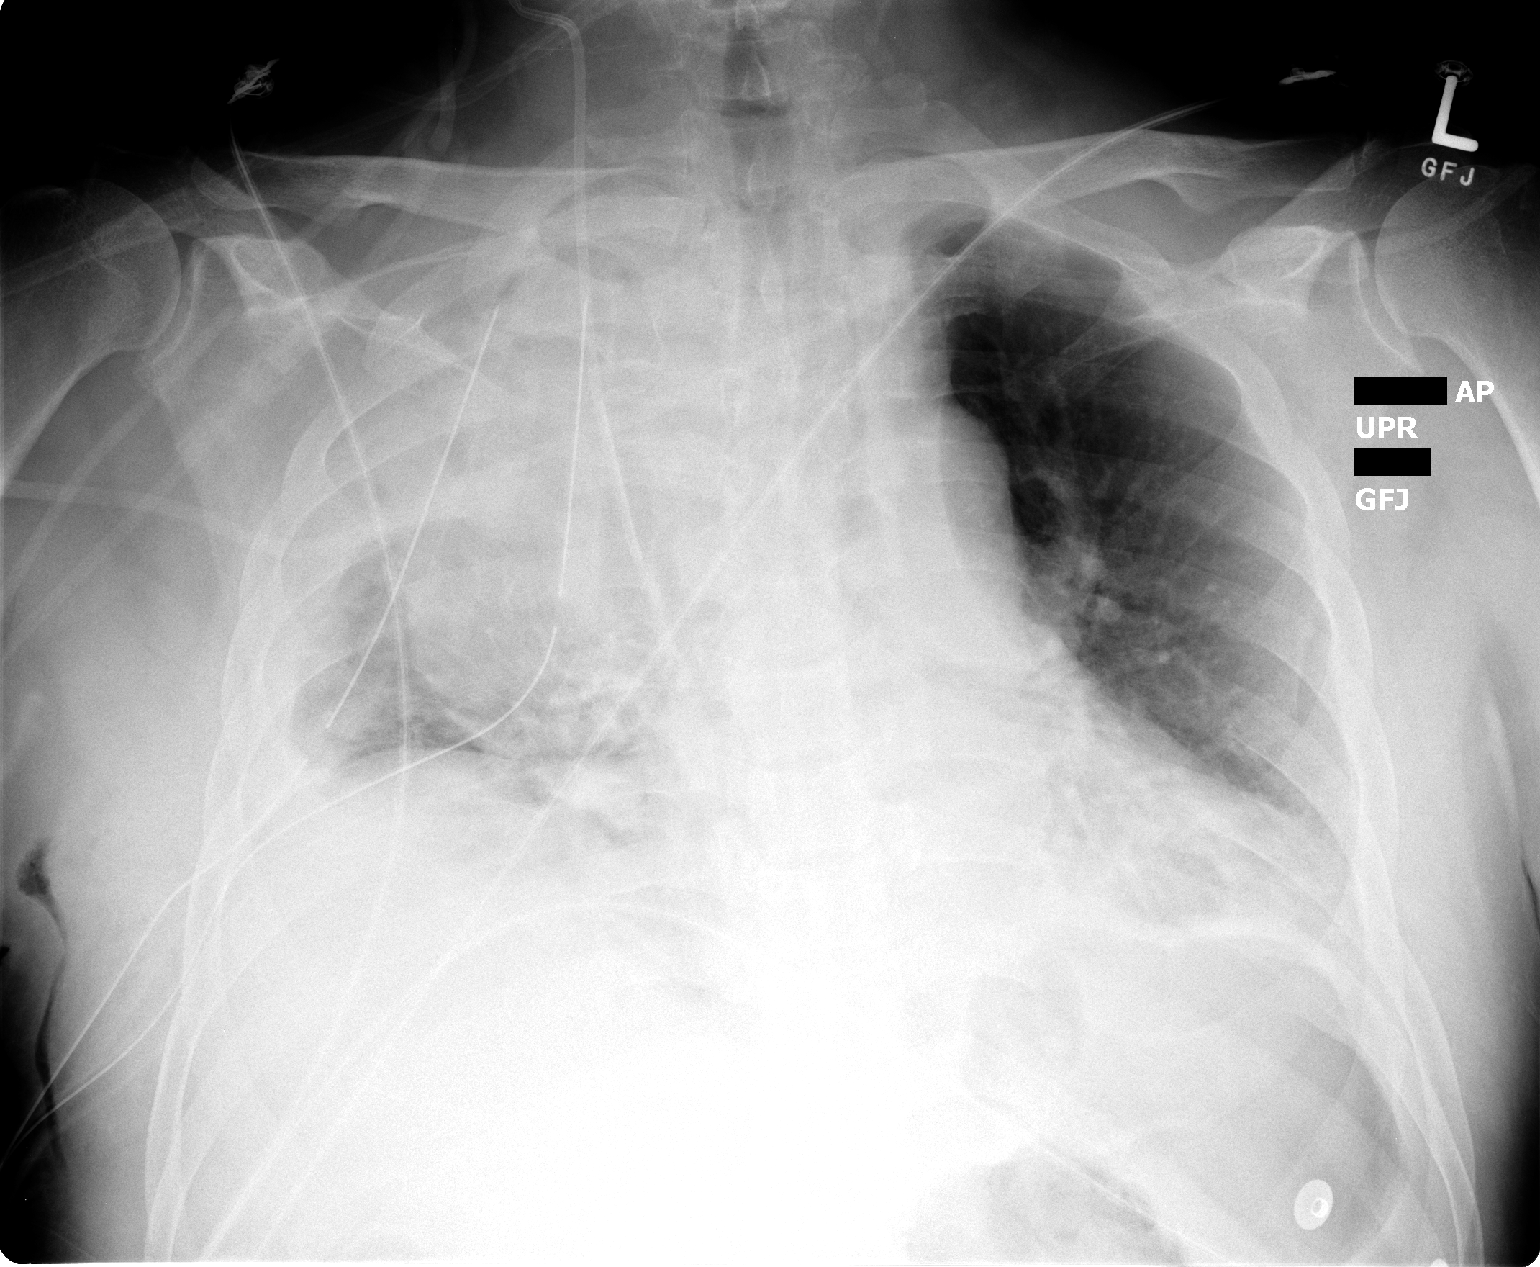

[1 of 1 positions shown; findings below may reference images not displayed]

FINDINGS: Two right pleural chest tubes.  Right jugular central venous catheter tip is in the SVC.  Specifically, no pneumothorax is detected.  Right base atelectasis and loculated right pleural effusion.
IMPRESSION: No pneumothorax.  See comments above.

## 2008-09-20 IMAGING — CT CT CHEST W/O CM
2 of 4 series · 15 of 36 positions shown, 18 images · IV contrast (agent unspecified)
Comparison: none

CLINICAL DATA: Postoperative right upper lobe resection, hemothorax.  
 CHEST CT WITHOUT CONTRAST:
TECHNIQUE: Multidetector CT imaging of the chest was performed following the standard protocol without IV contrast.

[Series 2: routine chest · axial · 0.79mm/px · z∈[-332,-52]mm · 12 of 66 slices shown, 15 images]
[im 5/66  mediastinal]
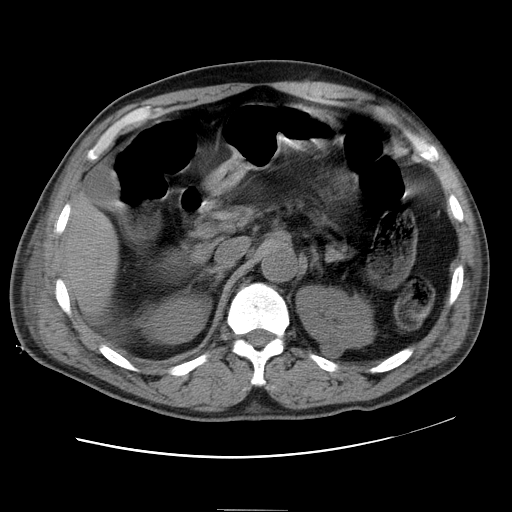
[im 5/66  lung]
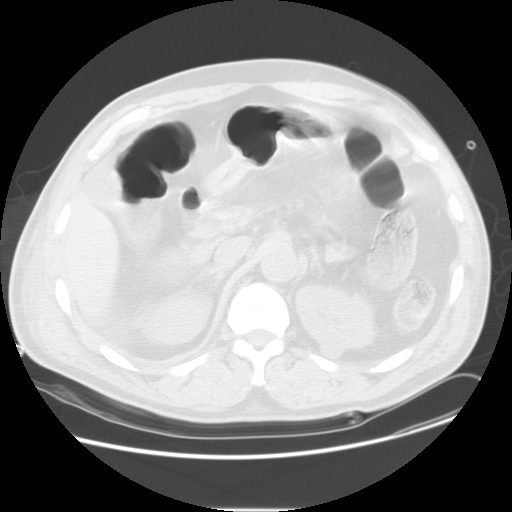
[im 9/66  lung]
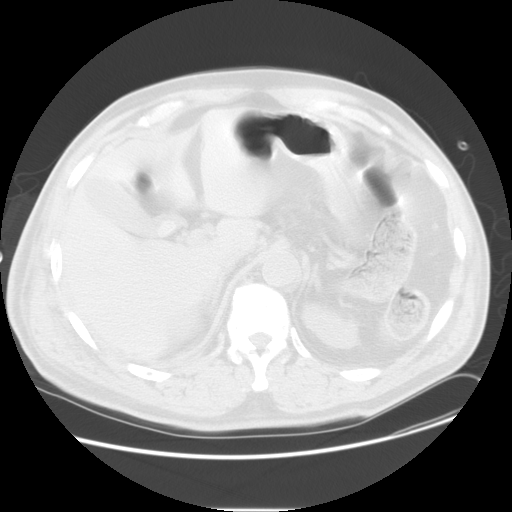
[im 14/66  lung]
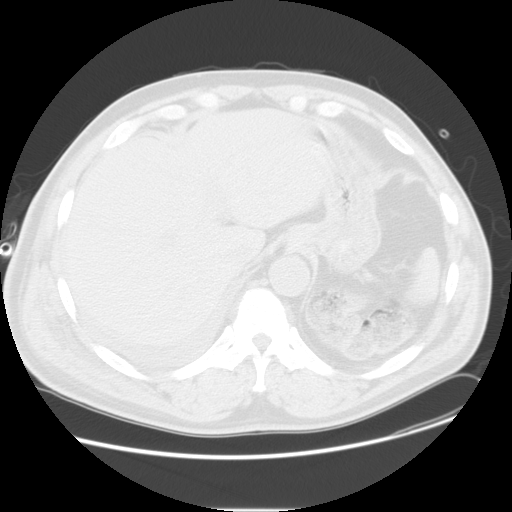
[im 22/66  lung]
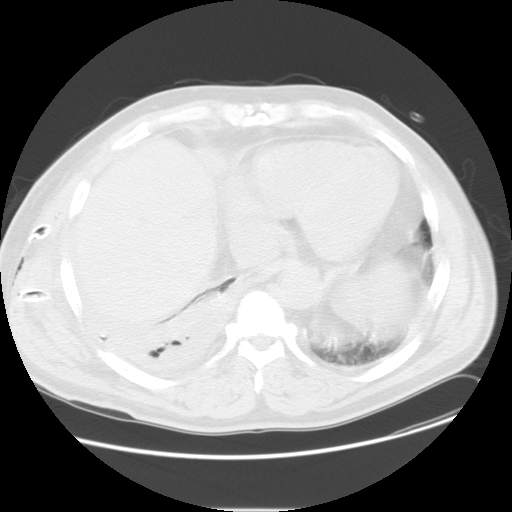
[im 27/66  mediastinal]
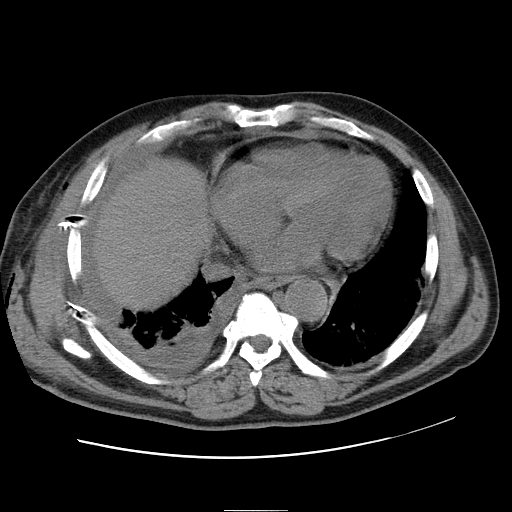
[im 27/66  lung]
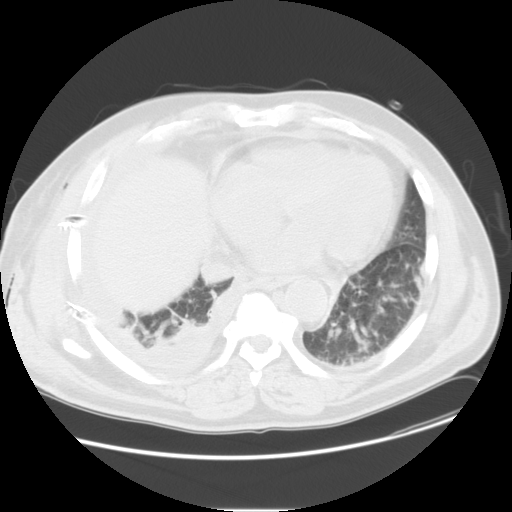
[im 31/66  lung]
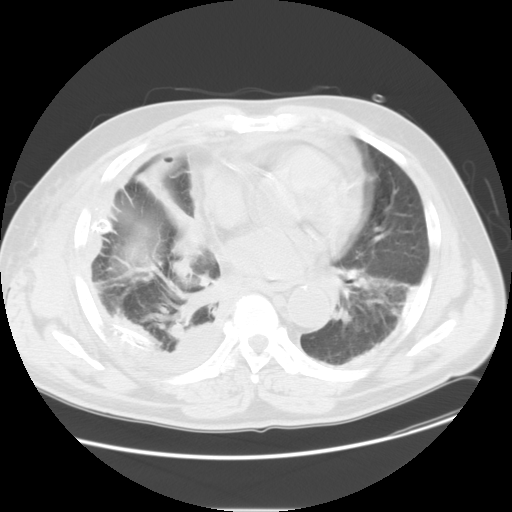
[im 35/66  lung]
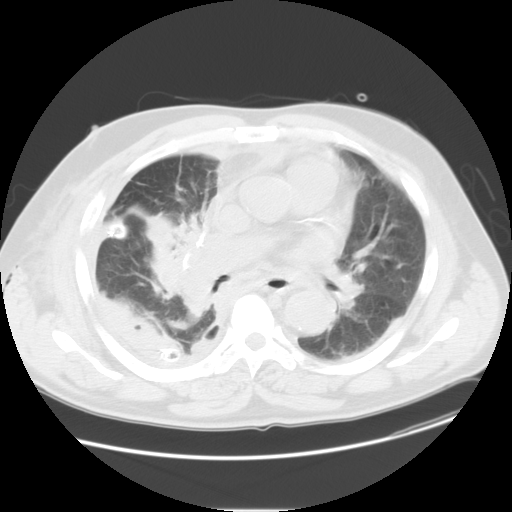
[im 40/66  lung]
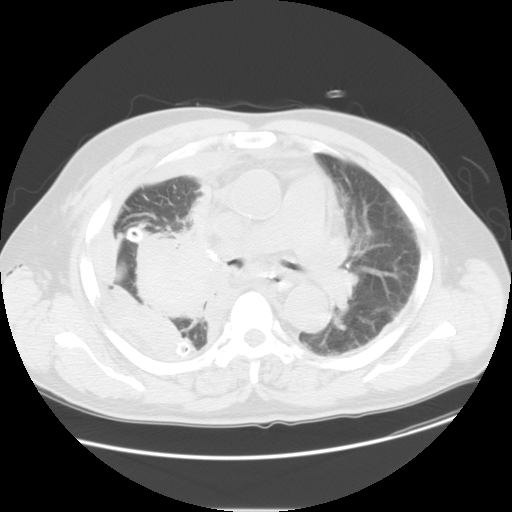
[im 44/66  mediastinal]
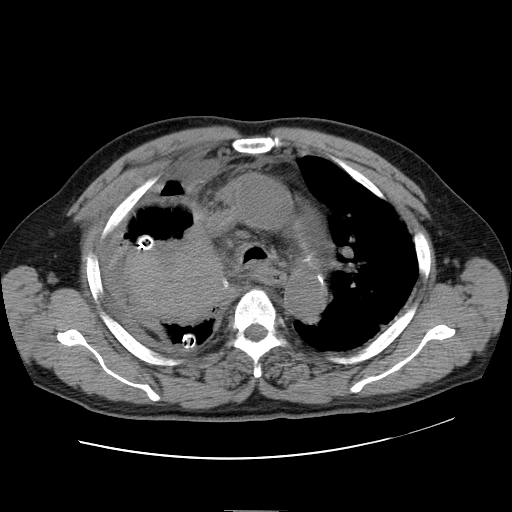
[im 44/66  lung]
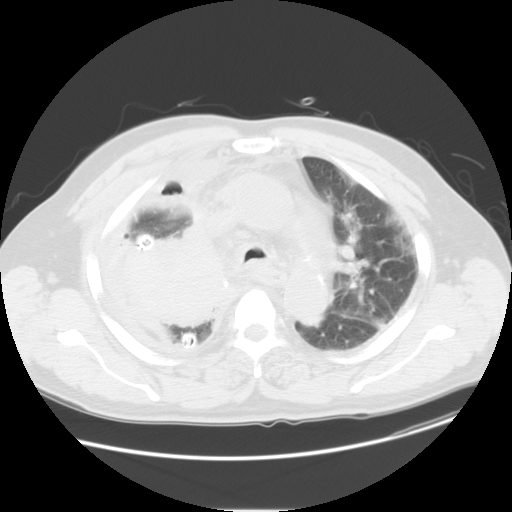
[im 53/66  lung]
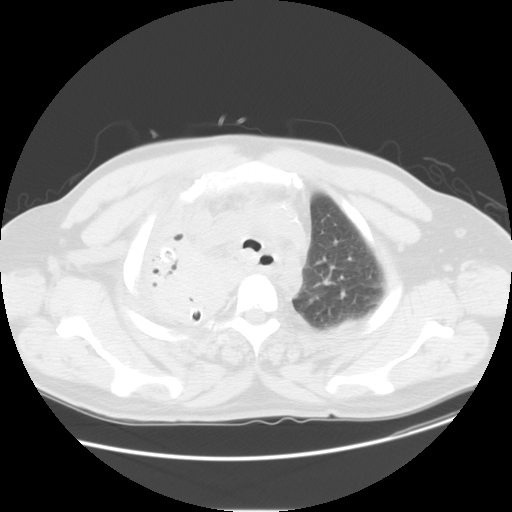
[im 57/66  lung]
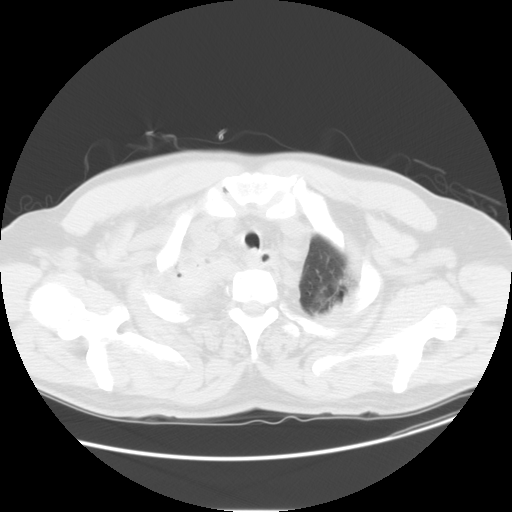
[im 61/66  lung]
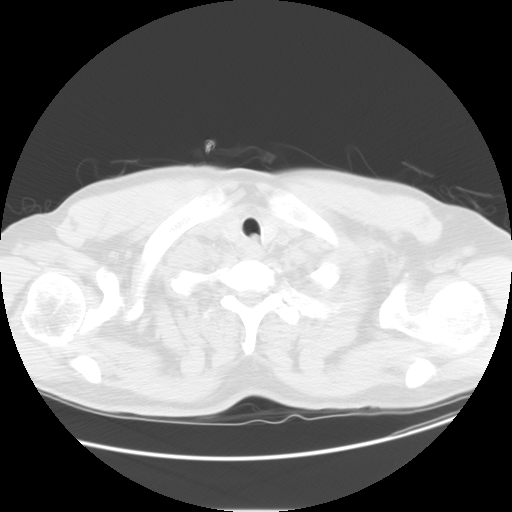

[Series 401: reformatted · coronal · 0.79mm/px · 3 of 121 slices shown]
[im 25/121  lung]
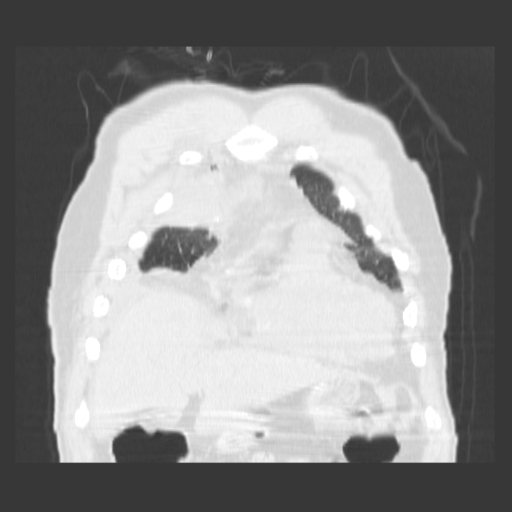
[im 49/121  lung]
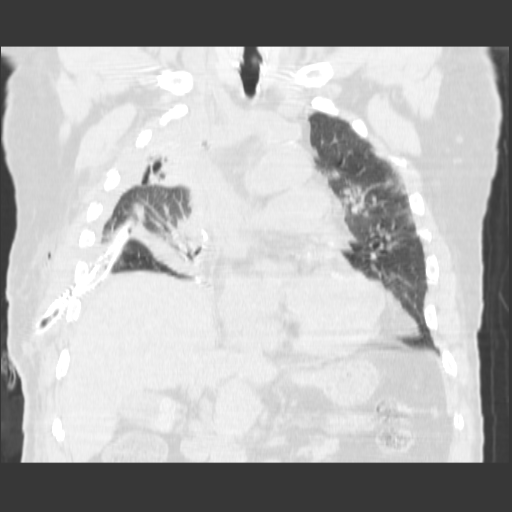
[im 73/121  lung]
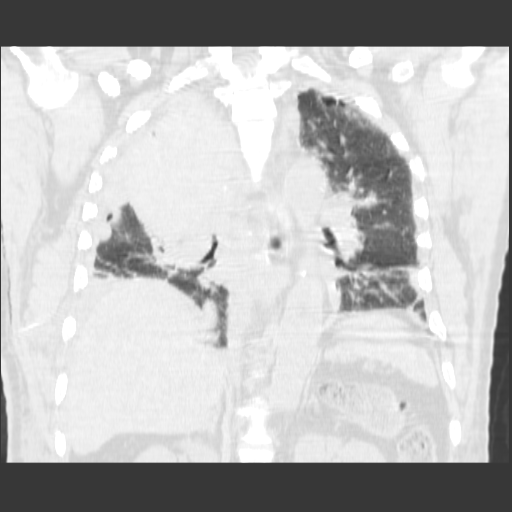

[15 of 36 positions shown; findings below may reference images not displayed]

FINDINGS: Two chest tubes are present on the right; one posteriorly and one in the apex in the major fissure.  There is a moderately large loculated pleural fluid collection on the right which is largest in the apex.  There are some small gas bubbles in the pleural space.  This is most likely hemothorax given its loculated mass-like appearance.  The largest component is in the right upper lobe measuring approximately 9 cm and is relative high density compatible with hemothorax.   Staple line is present in the right upper lobe.  There is some compressive atelectasis of the right lung.  
 The left lung shows some mild left upper lobe atelectasis.  No effusion is noted on the left.  
 Note is made of gallstones.
IMPRESSION: 1. Moderate to large loculated pleural effusion on the right compatible with hemothorax containing some small gas bubbles in the pleural space.  No significant pleural fluid is seen on the left. 
 2. Gallstones.

## 2008-09-20 IMAGING — CR DG CHEST 1V PORT
1 series · 1 of 1 positions shown · non-contrast
Comparison: Plain film of 04/30/06.

CLINICAL DATA: VATS for lung mass. 
 PORTABLE CHEST ? 1 VIEW:

[view not recorded]
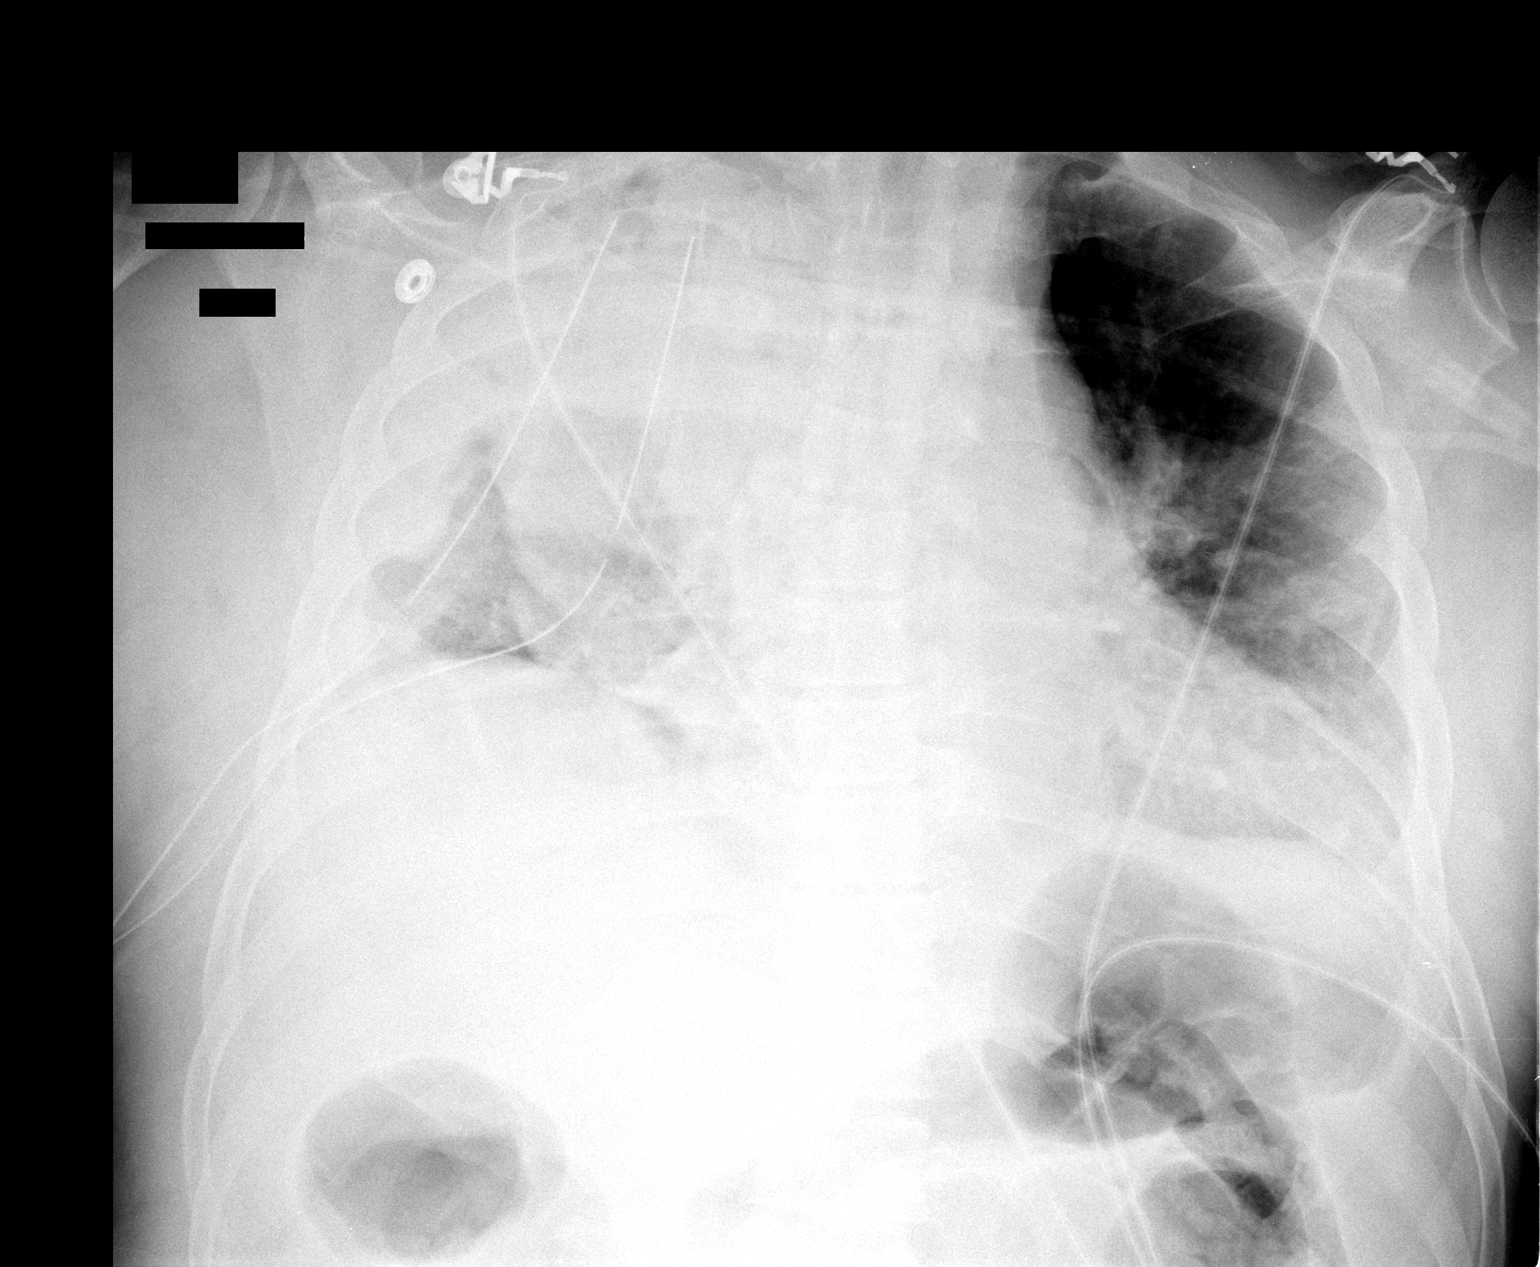

[1 of 1 positions shown; findings below may reference images not displayed]

FINDINGS: Two right-sided chest tubes are unchanged in position.  There is no evidence of apical pneumothorax.  There is increased visualization of the right hemidiaphragm which may represent a small amount of subpulmonic air.  The right IJ catheter has been removed.  Trachea is midline.  There is mild cardiomegaly.  Low lung volumes with left base atelectasis.  Similar opacity throughout the right hemithorax likely due to airspace disease and adjacent pleural fluid or thickening.
IMPRESSION: Postsurgical changes on the right with similar airspace disease.  Two right-sided chest tubes remain in place with lucency along the right hemidiaphragm.  Although this could represent an area of relatively aerated right lung base, a small amount of subpulmonic air is suspected.

## 2008-09-21 LAB — CONVERTED CEMR LAB
CO2: 20 meq/L (ref 19–32)
Creatinine, Ser: 1.63 mg/dL — ABNORMAL HIGH (ref 0.40–1.50)
GFR calc Af Amer: 52 mL/min — ABNORMAL LOW (ref >60)
GFR calc non Af Amer: 43 mL/min — ABNORMAL LOW (ref >60)
Glucose, Bld: 94 mg/dL (ref 70–99)
HDL: 44 mg/dL (ref >39)
TSH: 0.769 microintl units/mL (ref 0.350–4.500)
Total Bilirubin: 0.4 mg/dL (ref 0.3–1.2)
Total Protein: 8.1 g/dL (ref 6.0–8.3)
Triglycerides: 84 mg/dL (ref <150)

## 2008-09-21 IMAGING — CR DG CHEST 1V PORT
1 series · 1 of 1 positions shown · non-contrast
Comparison: Earlier today 05/02/06.

CLINICAL DATA: Lung mass, VATS.
 PORTABLE CHEST - 1 VIEW, 05/02/06 AT 4003 HOURS:

[view not recorded]
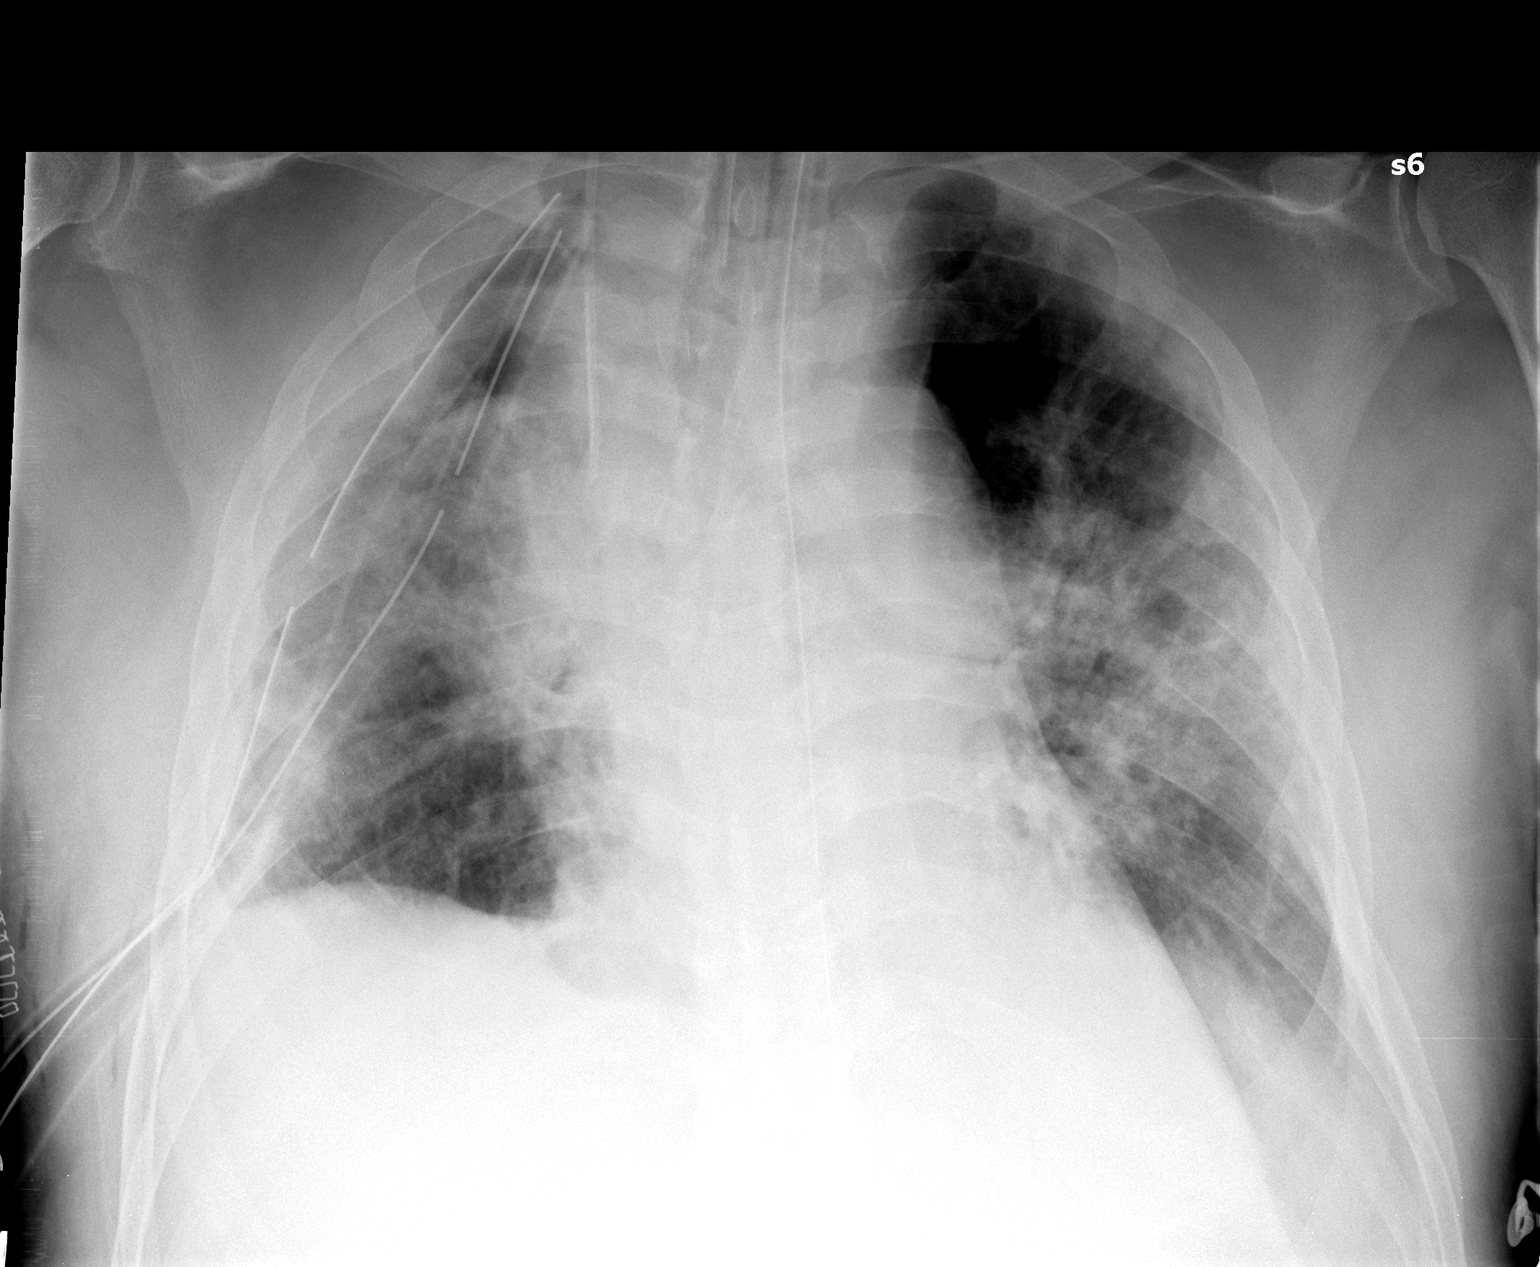

[1 of 1 positions shown; findings below may reference images not displayed]

FINDINGS: Endotracheal tube tip is approximately 5.5 cm above the carina.  Right IJ central venous catheter tip is in the SVC.  There is better aeration of the right lung and two right chest tubes remain.  No definite pneumothorax is seen.  Patchy air space disease remains primarily perihilar in location.
IMPRESSION: 1.  Endotracheal tube tip 5.5 cm above the carina.  
 2.  Right IJ central venous catheter tip in SVC.
 3.  Improved aeration with patchy air space disease bilaterally.

## 2008-09-21 IMAGING — CR DG CHEST 1V PORT
1 series · 1 of 1 positions shown · non-contrast
Comparison: Yesterday?s exam.

CLINICAL DATA: Right VATS for lung mass.  
 PORTABLE CHEST - 1 VIEW, 4544 HOURS:

[view not recorded]
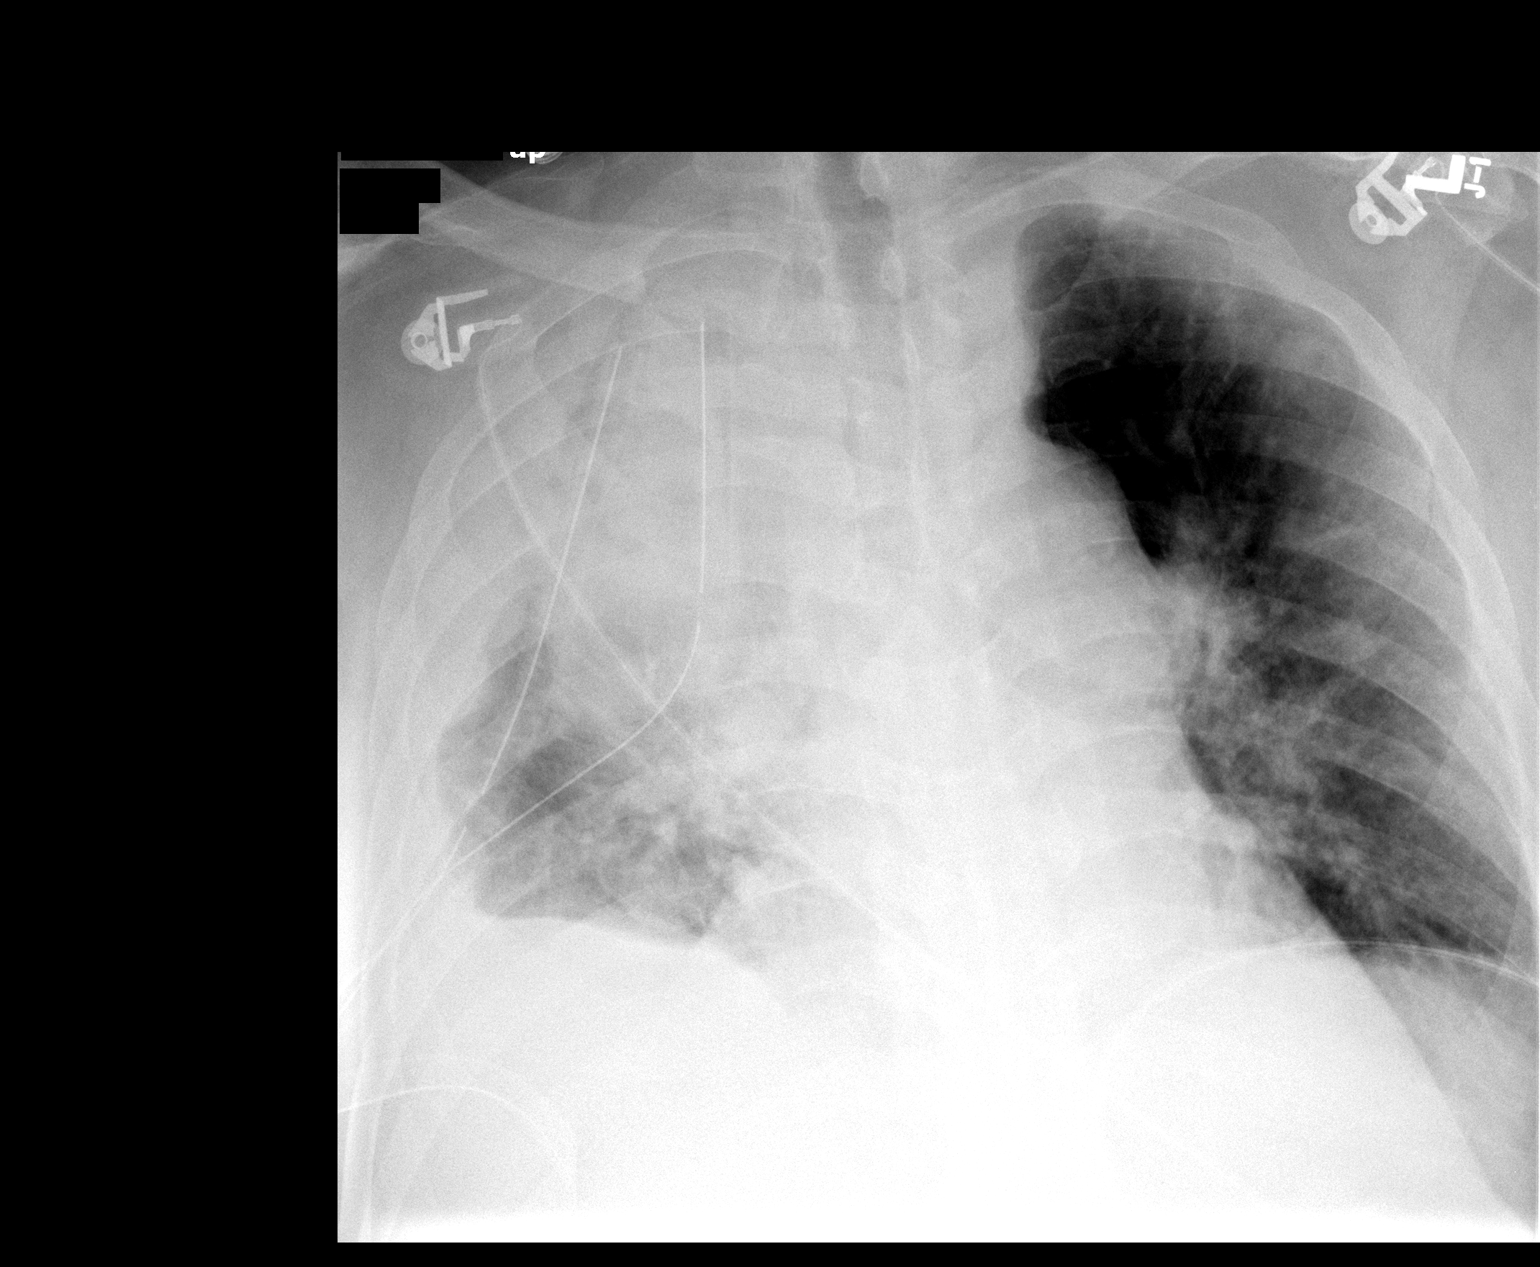

[1 of 1 positions shown; findings below may reference images not displayed]

FINDINGS: Large amount of pleural fluid is again appreciated on the right.  Only a small amount of aerated lung at the right base.  Two right pleural chest tubes.  No pneumothorax.  Pulmonary vascular congestion on the left.
IMPRESSION: Large left pleural fluid collection/hemothorax without significant change.

## 2008-09-22 IMAGING — CR DG CHEST 1V PORT
1 series · 1 of 1 positions shown · non-contrast
Comparison: 05/02/06.

CLINICAL DATA: History of lung mass.
 PORTABLE CHEST - 1 VIEW:

[view not recorded]
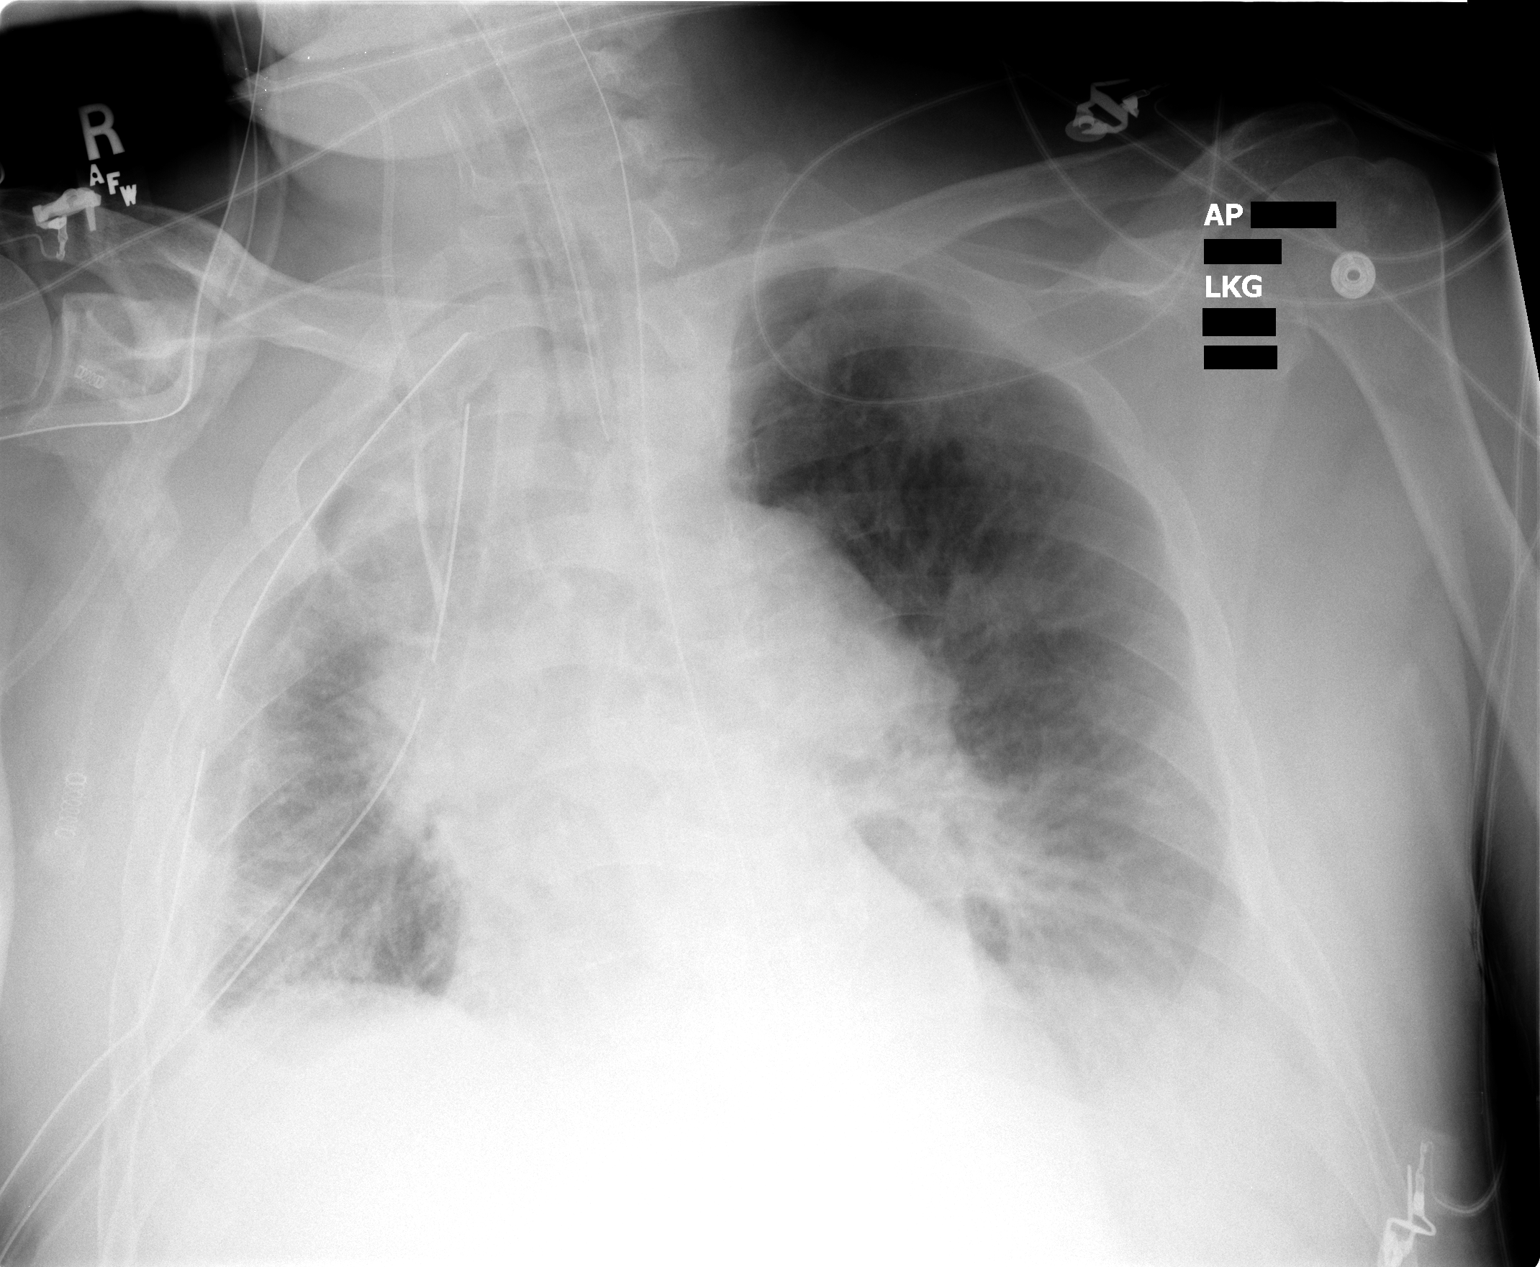

[1 of 1 positions shown; findings below may reference images not displayed]

FINDINGS: Right chest tubes, right internal jugular vein, endotracheal tube, and nasogastric tube appear stable. Persistent parenchymal densities throughout both lungs that may represent pulmonary edema.  Prominent hilar opacities are unchanged and the patient is rotated towards the right, which accentuates the hilar structures. Negative for a large pneumothorax.
IMPRESSION: Minimal change with support apparatuses as described and persistent parenchymal lung densities.

## 2008-09-22 IMAGING — CR DG ABD PORTABLE 1V
1 series · 1 of 1 positions shown · non-contrast
Comparison: Earlier the same day

CLINICAL DATA: Feeding tube placement

[view not recorded]
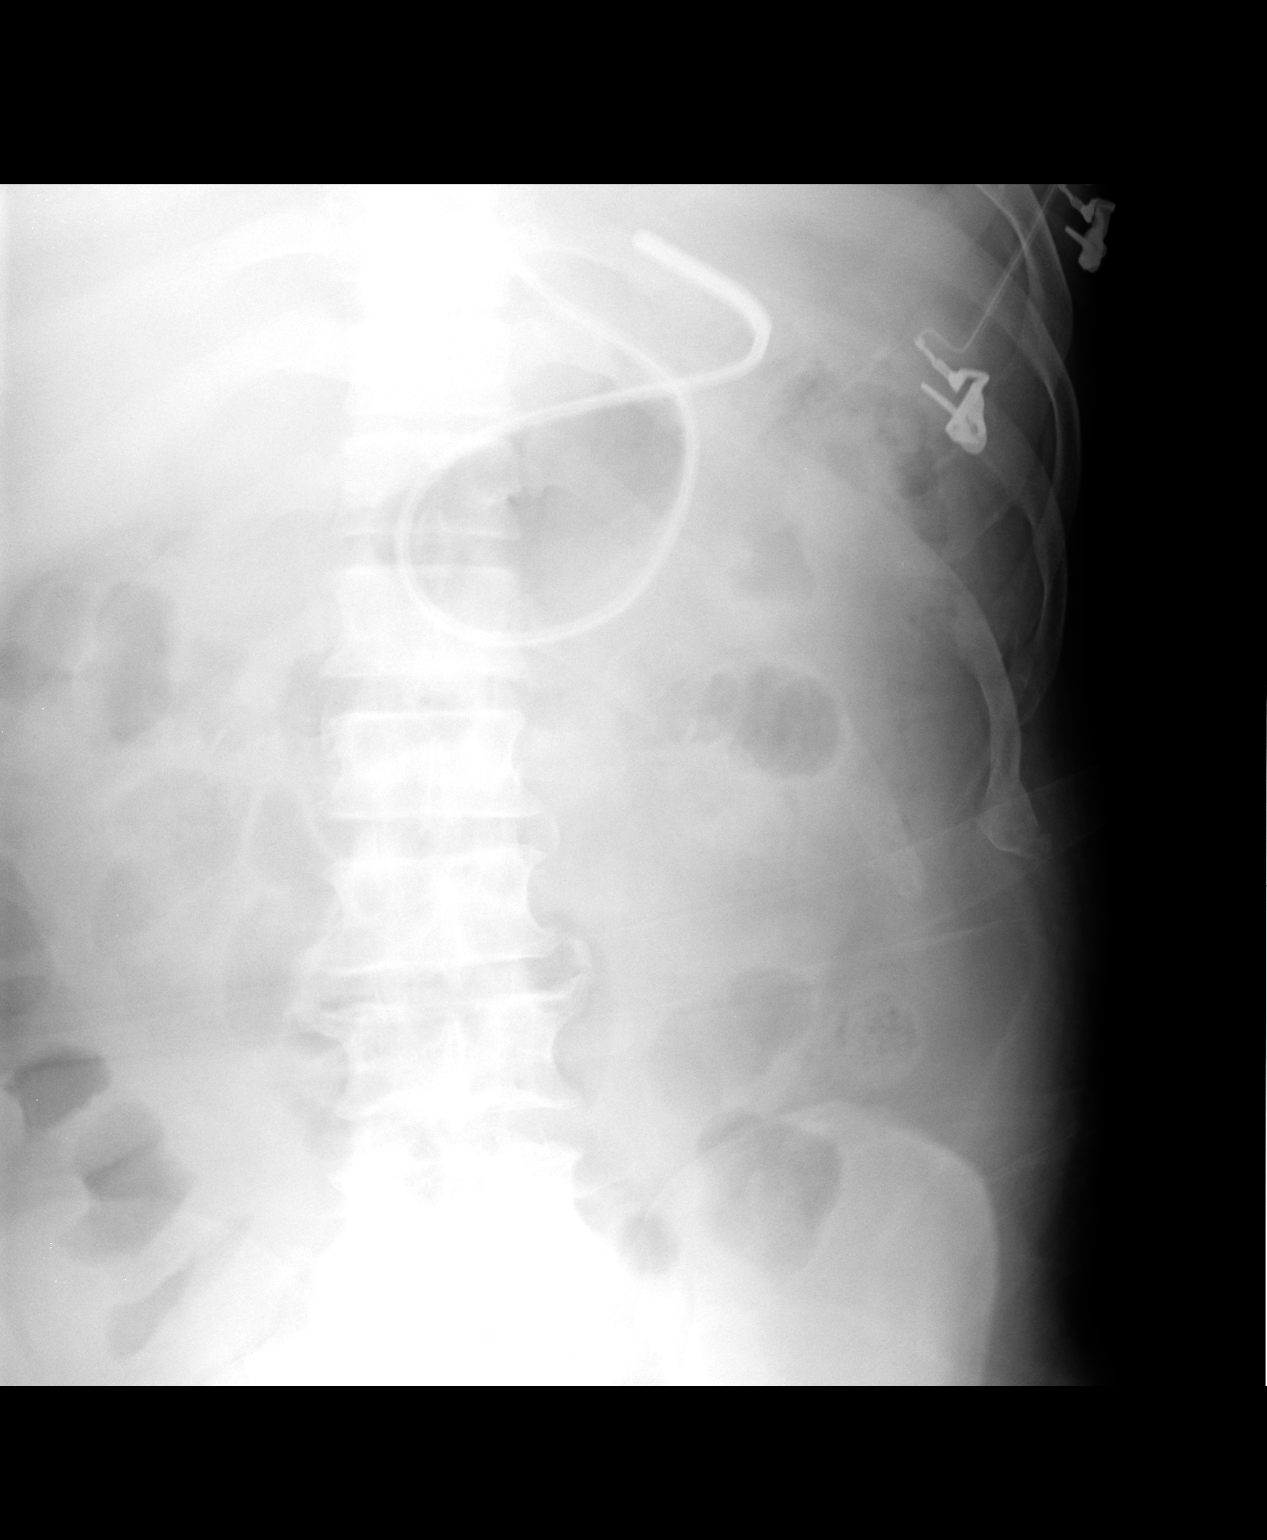

[1 of 1 positions shown; findings below may reference images not displayed]

PORTABLE ABDOMEN - 1 VIEW:

0798 hours. Feeding tube is coiled in the stomach with the tip overlying the
gastric fundus. Bowel gas pattern is nonspecific.
IMPRESSION: Feeding tube is coiled in the stomach.

## 2008-09-22 IMAGING — CR DG ABD PORTABLE 1V
1 series · 1 of 1 positions shown · non-contrast
Comparison: none

CLINICAL DATA: Lung cancer.  Feeding tube placement.
PORTABLE ABDOMEN - 1 VIEW:

[view not recorded]
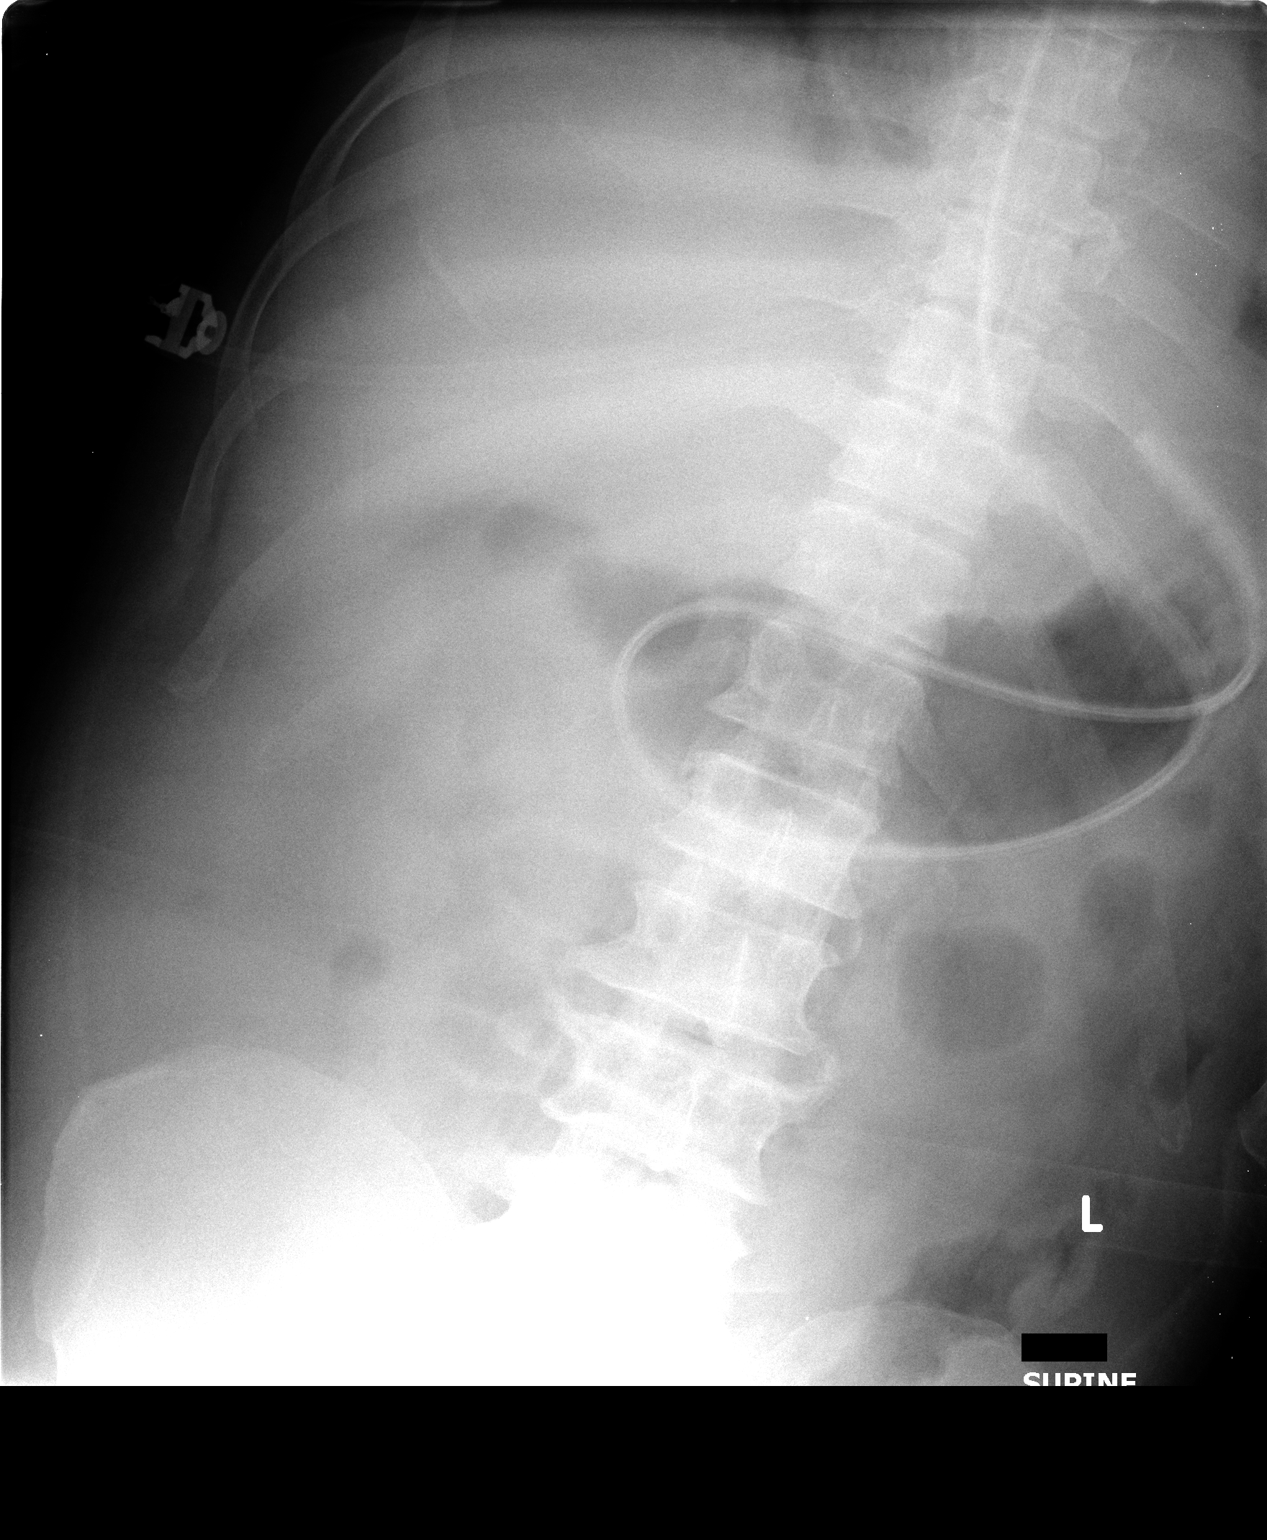

[1 of 1 positions shown; findings below may reference images not displayed]

FINDINGS: A Panda feeding tube is looped within the stomach with the tip in the fundus.  No dilated bowel loops are seen.
IMPRESSION: The Panda feeding tube is looped within the stomach, with the tip in the region of the gastric fundus.

## 2008-09-22 IMAGING — CR DG ABD PORTABLE 1V
1 series · 1 of 1 positions shown · non-contrast
Comparison: Earlier the same day

CLINICAL DATA: Feeding tube placement

[view not recorded]
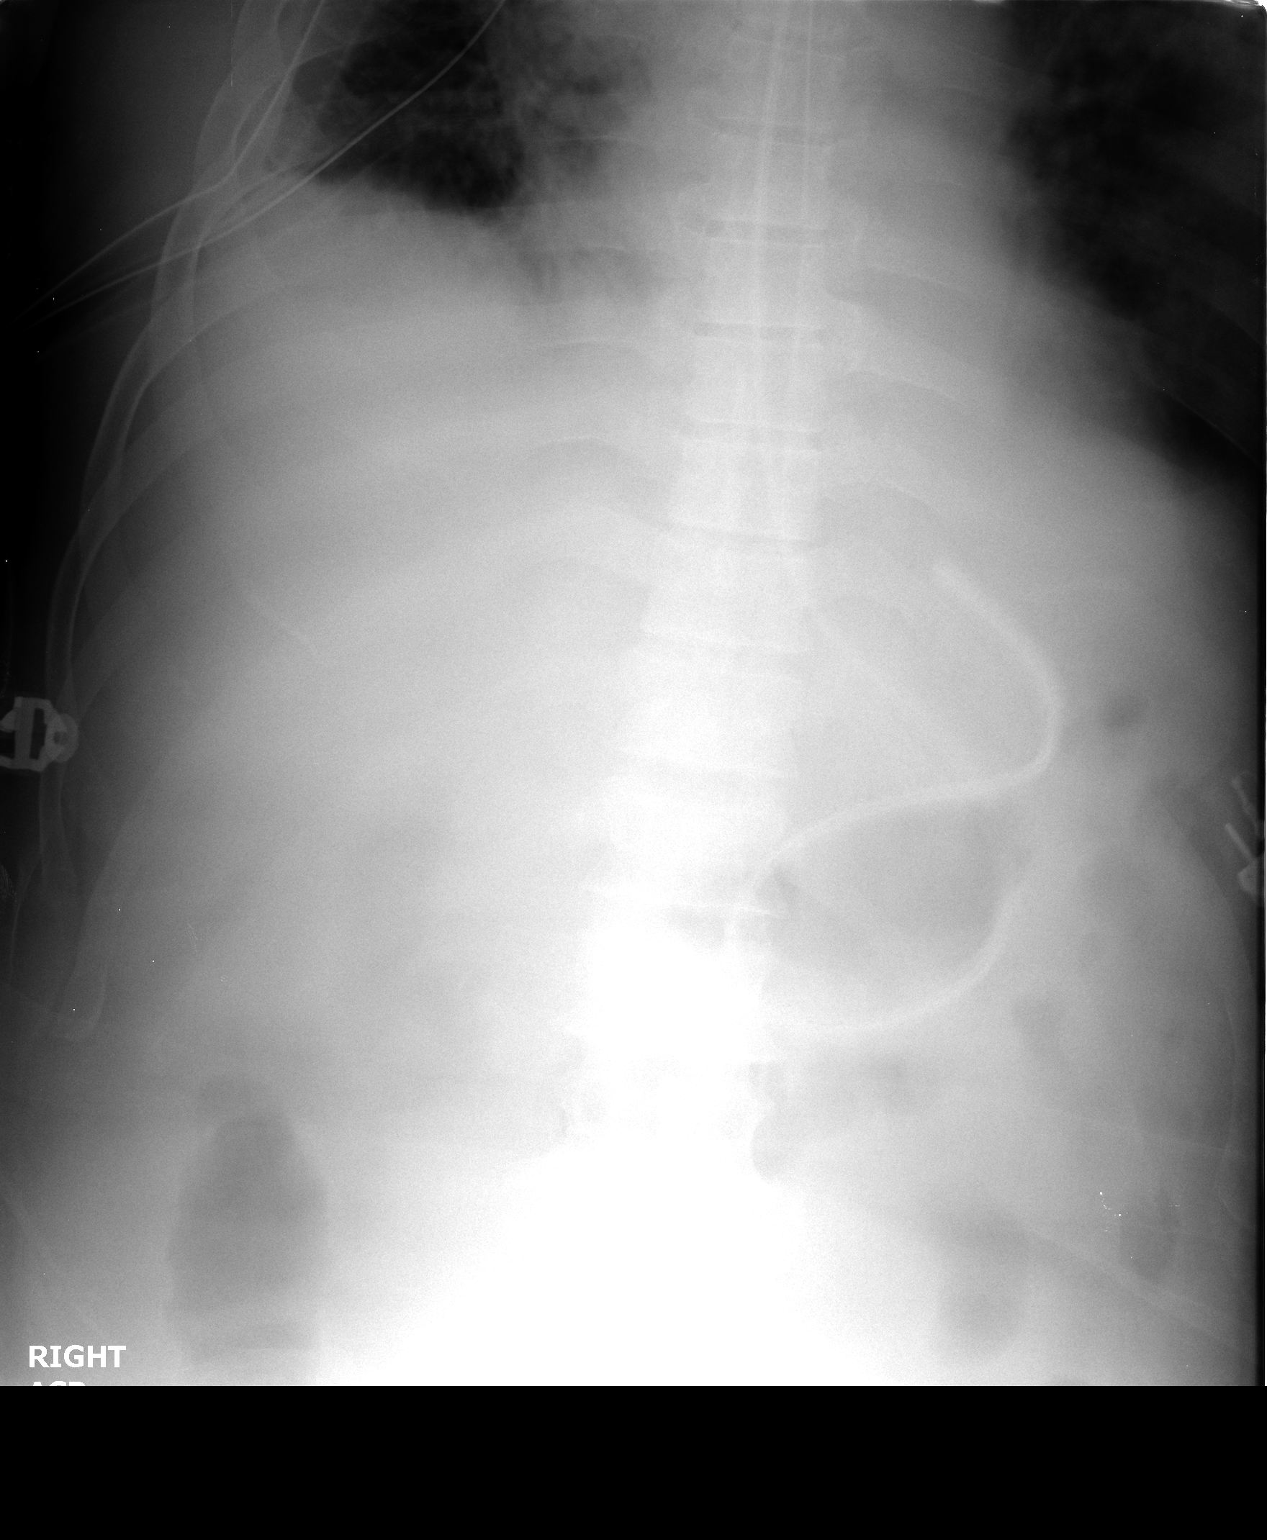

[1 of 1 positions shown; findings below may reference images not displayed]

PORTABLE ABDOMEN - 1 VIEW:

2729 hours. The feeding catheter remains coiled in the stomach.i
IMPRESSION: Feeding tube is coiled in the stomach.

## 2008-09-23 ENCOUNTER — Telehealth (INDEPENDENT_AMBULATORY_CARE_PROVIDER_SITE_OTHER): Payer: Self-pay | Admitting: Internal Medicine

## 2008-09-23 IMAGING — CR DG CHEST 1V PORT
1 series · 1 of 1 positions shown · non-contrast
Comparison: 05/03/06.

CLINICAL DATA: 60 year old with history of a lung mass.
 PORTABLE CHEST - 1 VIEW - 05/04/06:

[view not recorded]
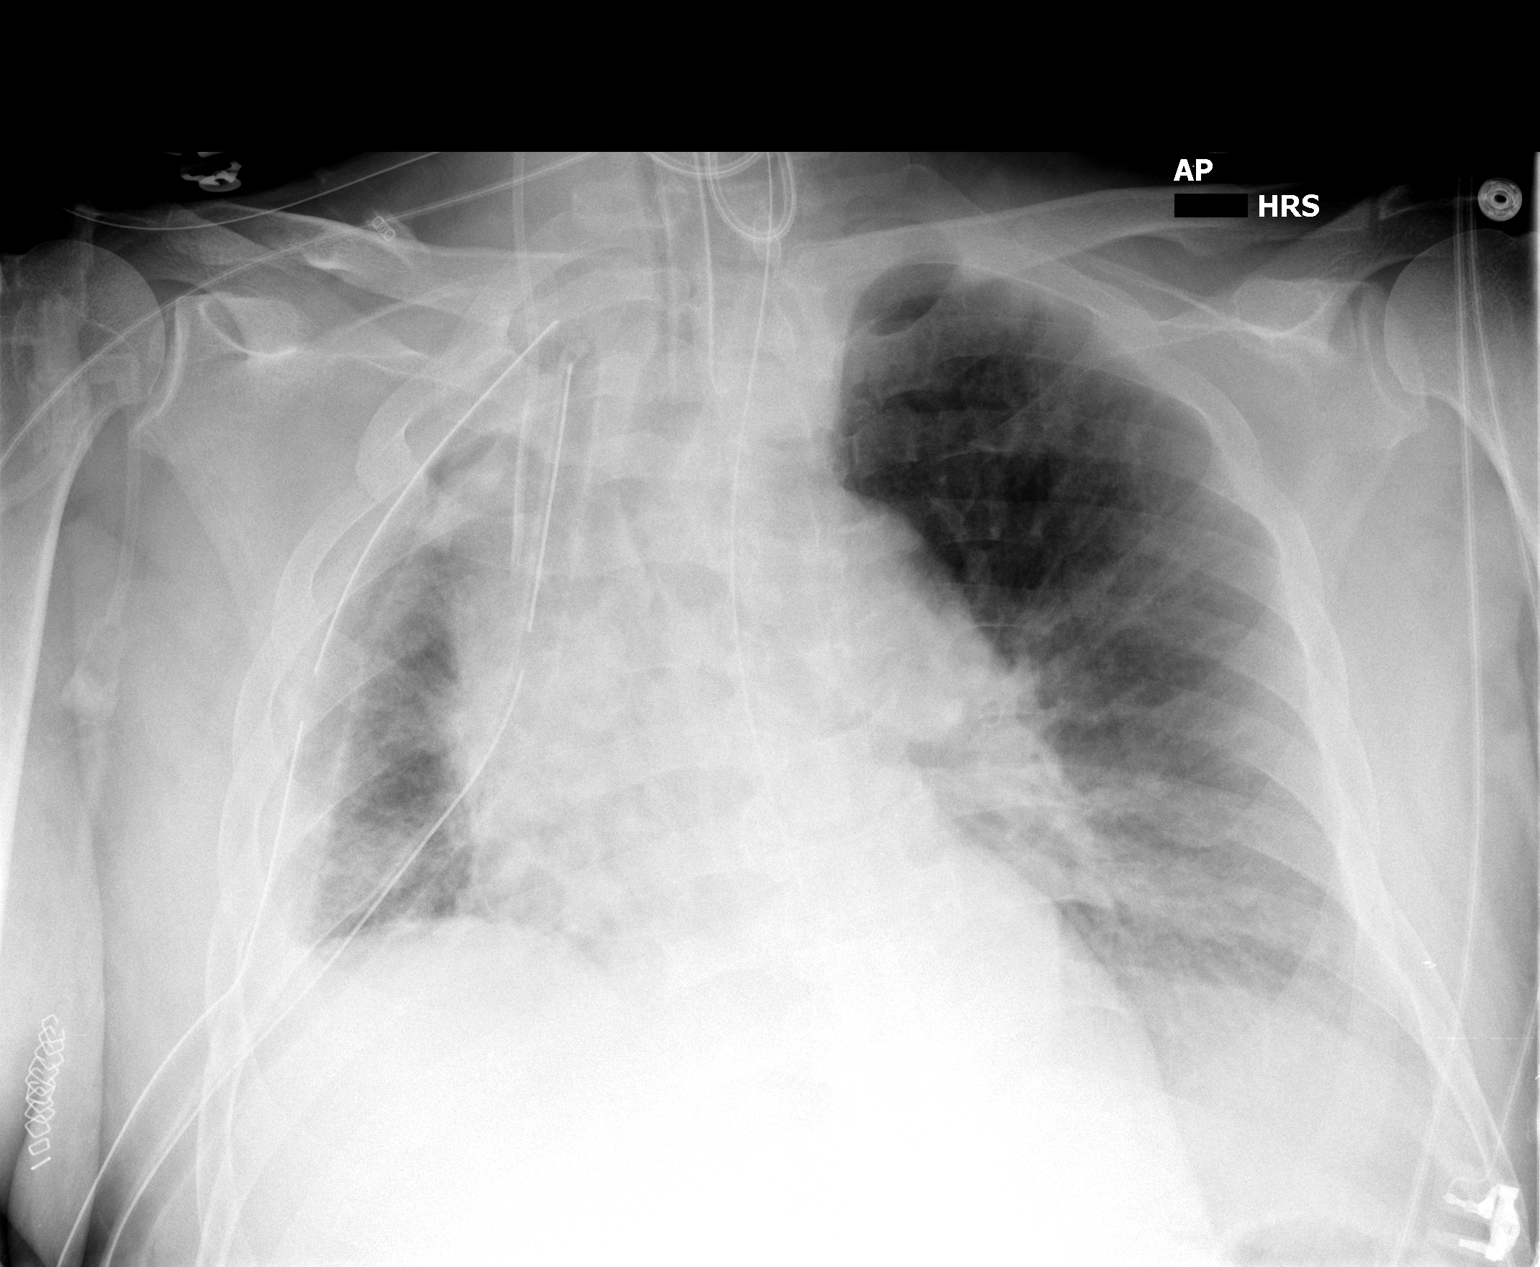

[1 of 1 positions shown; findings below may reference images not displayed]

FINDINGS: Portable view of the chest demonstrates a right chest tube with a right IJ central venous catheter.  The patient is rotated towards the right on the film.  The endotracheal tube is approximately 5.6 cm above the carina.  A nasogastric tube is followed into the abdomen.  There are streaky interstitial densities consistent with pulmonary edema.
IMPRESSION: 1.  Stable chest findings without a large pneumothorax.
 2.  Persistent streaky densities probably related to edema.

## 2008-09-23 IMAGING — CR DG ABDOMEN 1V
1 series · 1 of 1 positions shown · non-contrast
Comparison: 05/03/06.

CLINICAL DATA: Evaluate Panda tube placement.
ABDOMEN ? 1 VIEW ? 05/04/06:

[view not recorded]
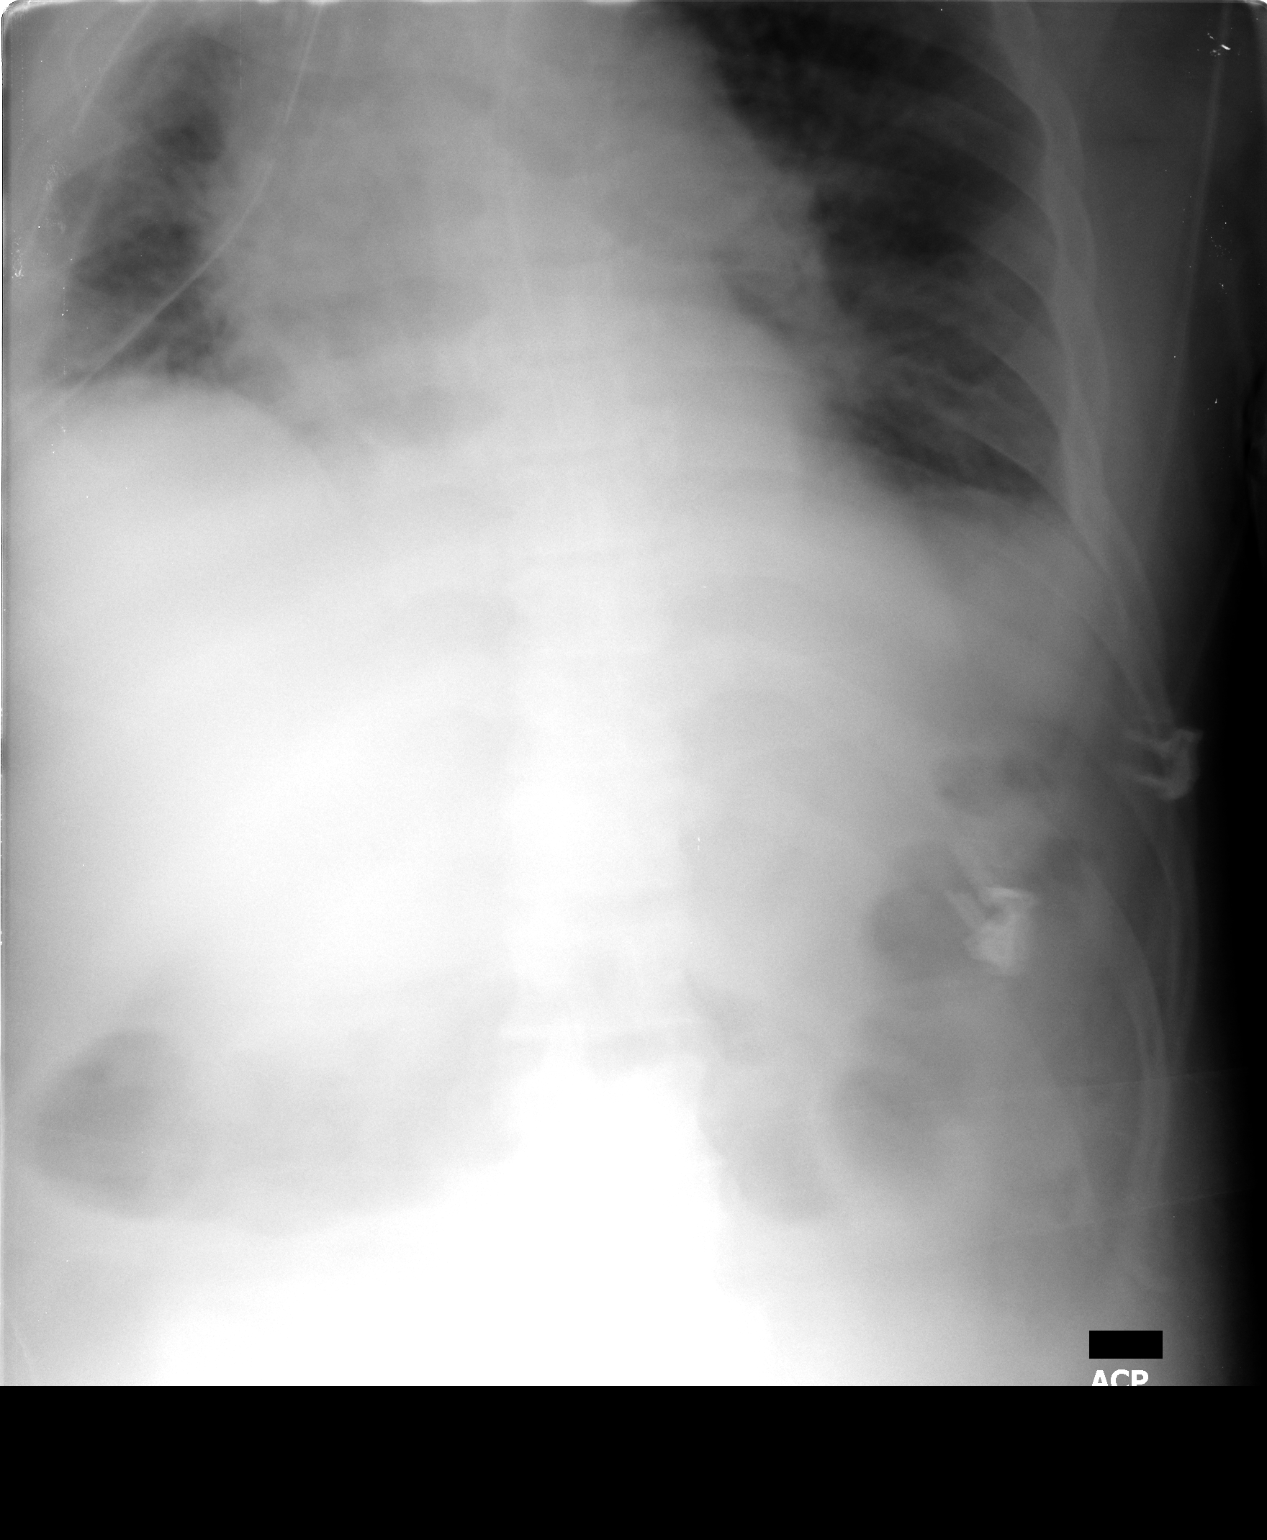

[1 of 1 positions shown; findings below may reference images not displayed]

FINDINGS: A single view of the upper abdomen was obtained.  The Panda feeding tube is no longer present.  There is a nasogastric tube with the tip at the gastroesophageal junction.  There are persistent parenchyma densities in the lung bases, right greater than left.  The visualized bowel gas pattern is nonspecific.
IMPRESSION: 1.  A Panda feeding tube is not present.
2.  A nasogastric tube tip is at the gastroesophageal junction.

## 2008-09-24 IMAGING — CR DG CHEST 1V PORT
1 series · 1 of 1 positions shown · non-contrast
Comparison: 05/04/06.

CLINICAL DATA: Lung mass post-VATS.  
 PORTABLE CHEST ([DATE] HOURS):

[view not recorded]
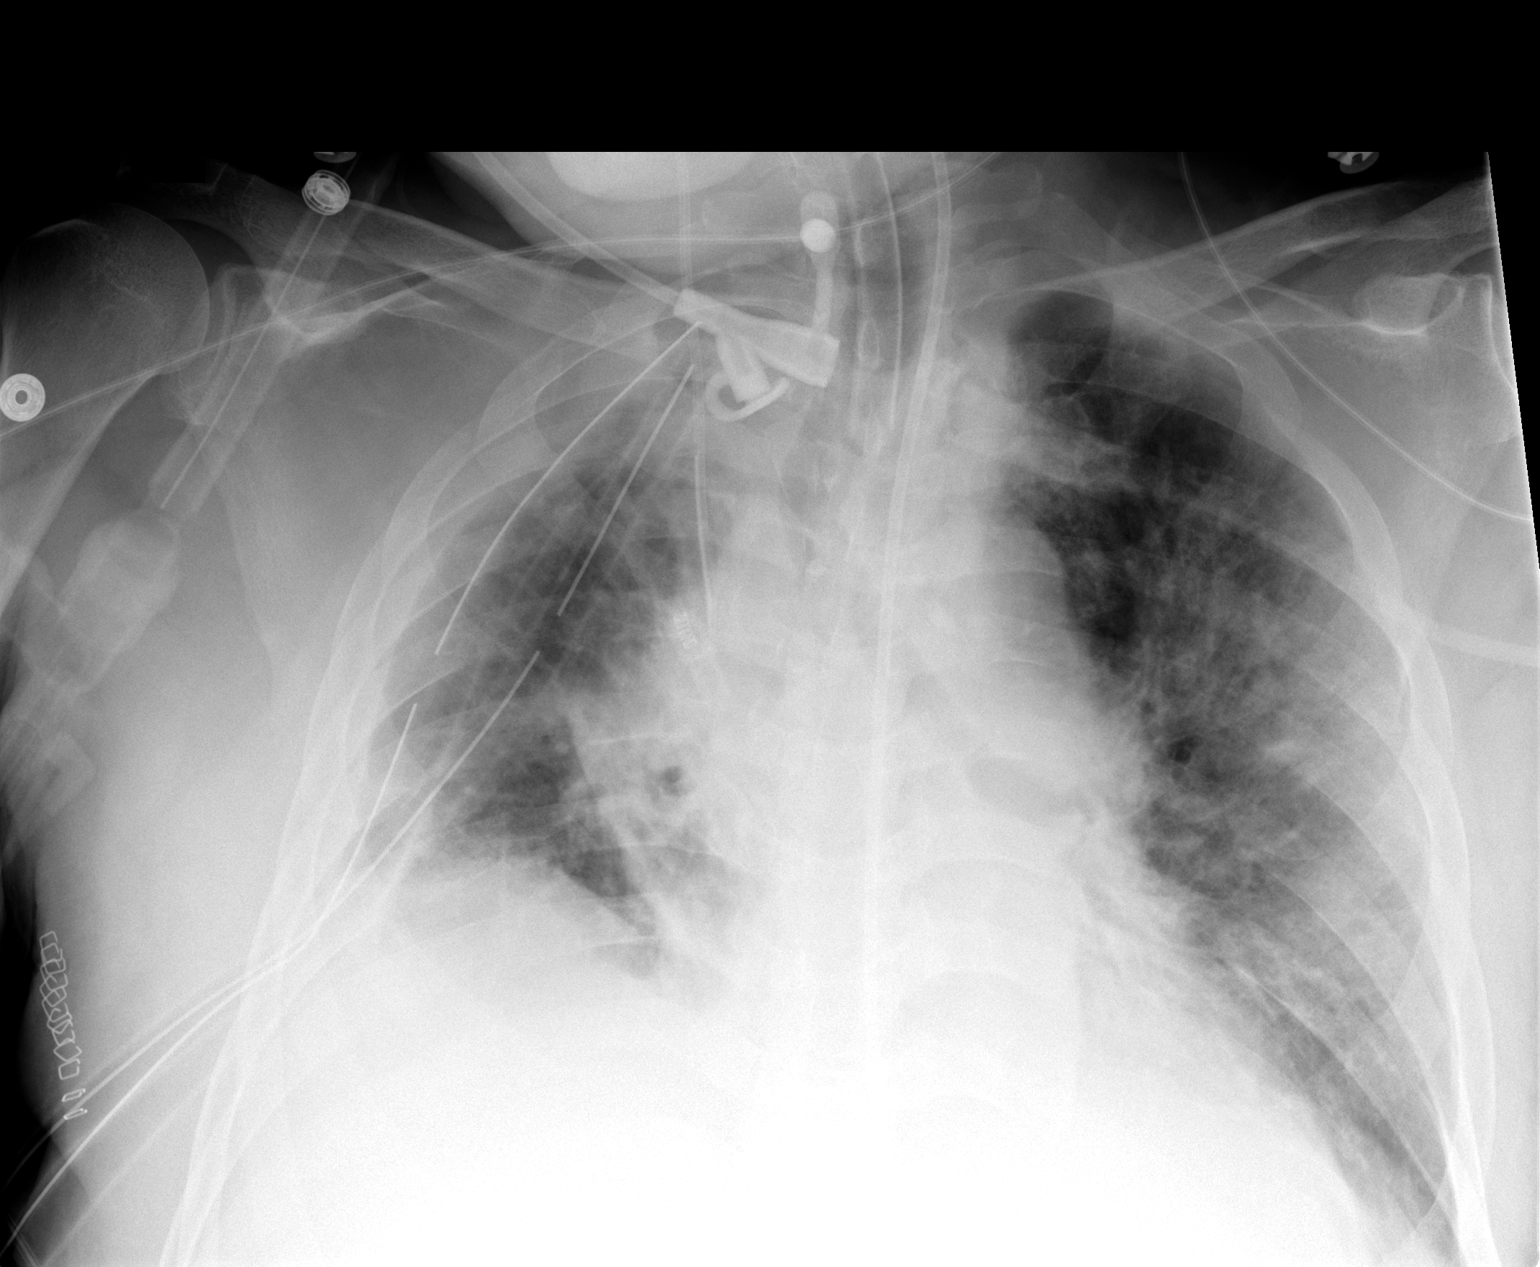

[1 of 1 positions shown; findings below may reference images not displayed]

FINDINGS: The nasogastric tube has been exchanged for a feeding tube which projects below the diaphragm.   Endotracheal tube, central line, and right chest tubes appear stable.  Volume loss in the right hemithorax, right pleural fluid, and bilateral pulmonary opacities are unchanged.  There is no pneumothorax.   The cardiomediastinal contours appear stable.
IMPRESSION: Stable postoperative chest aside from interval exchange of the nasogastric tube for a feeding tube.  No new findings.

## 2008-09-25 IMAGING — CR DG CHEST 1V PORT
1 series · 1 of 1 positions shown · non-contrast
Comparison: 05/05/06.

CLINICAL DATA: Lung mass; VATS.
 PORTABLE CHEST - 1 VIEW, 05/06/06 AT 9656 HOURS:

[view not recorded]
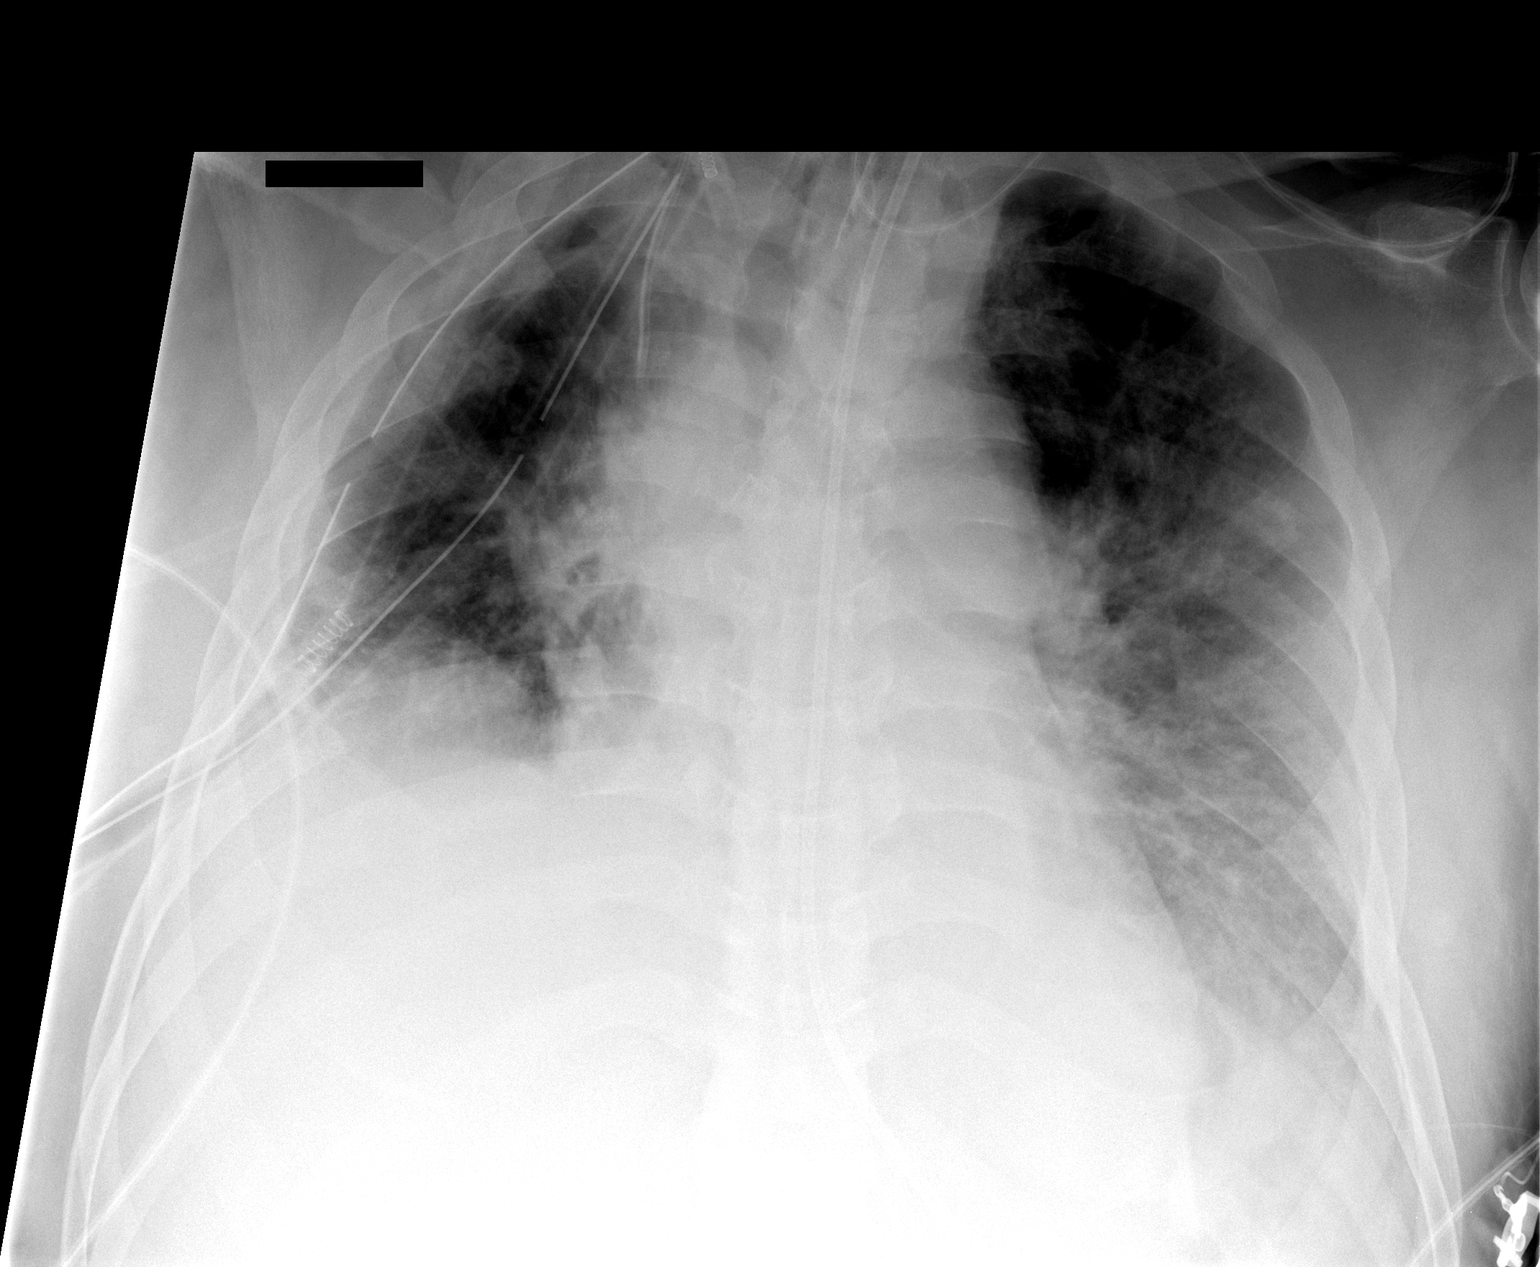

[1 of 1 positions shown; findings below may reference images not displayed]

FINDINGS: The supportive devices remain unchanged in good position.  There is a small right pleural effusion, unchanged.  Pulmonary vascular edema also appears stable.
IMPRESSION: No change compared with 05/05/06 exam.

## 2008-09-25 IMAGING — CR DG CHEST 1V PORT
1 series · 1 of 1 positions shown · non-contrast
Comparison: none

CLINICAL DATA: PICC line placement. 
PORTABLE CHEST ? 1 VIEW ? 05/06/06 AT 3346 HOURS:

[view not recorded]
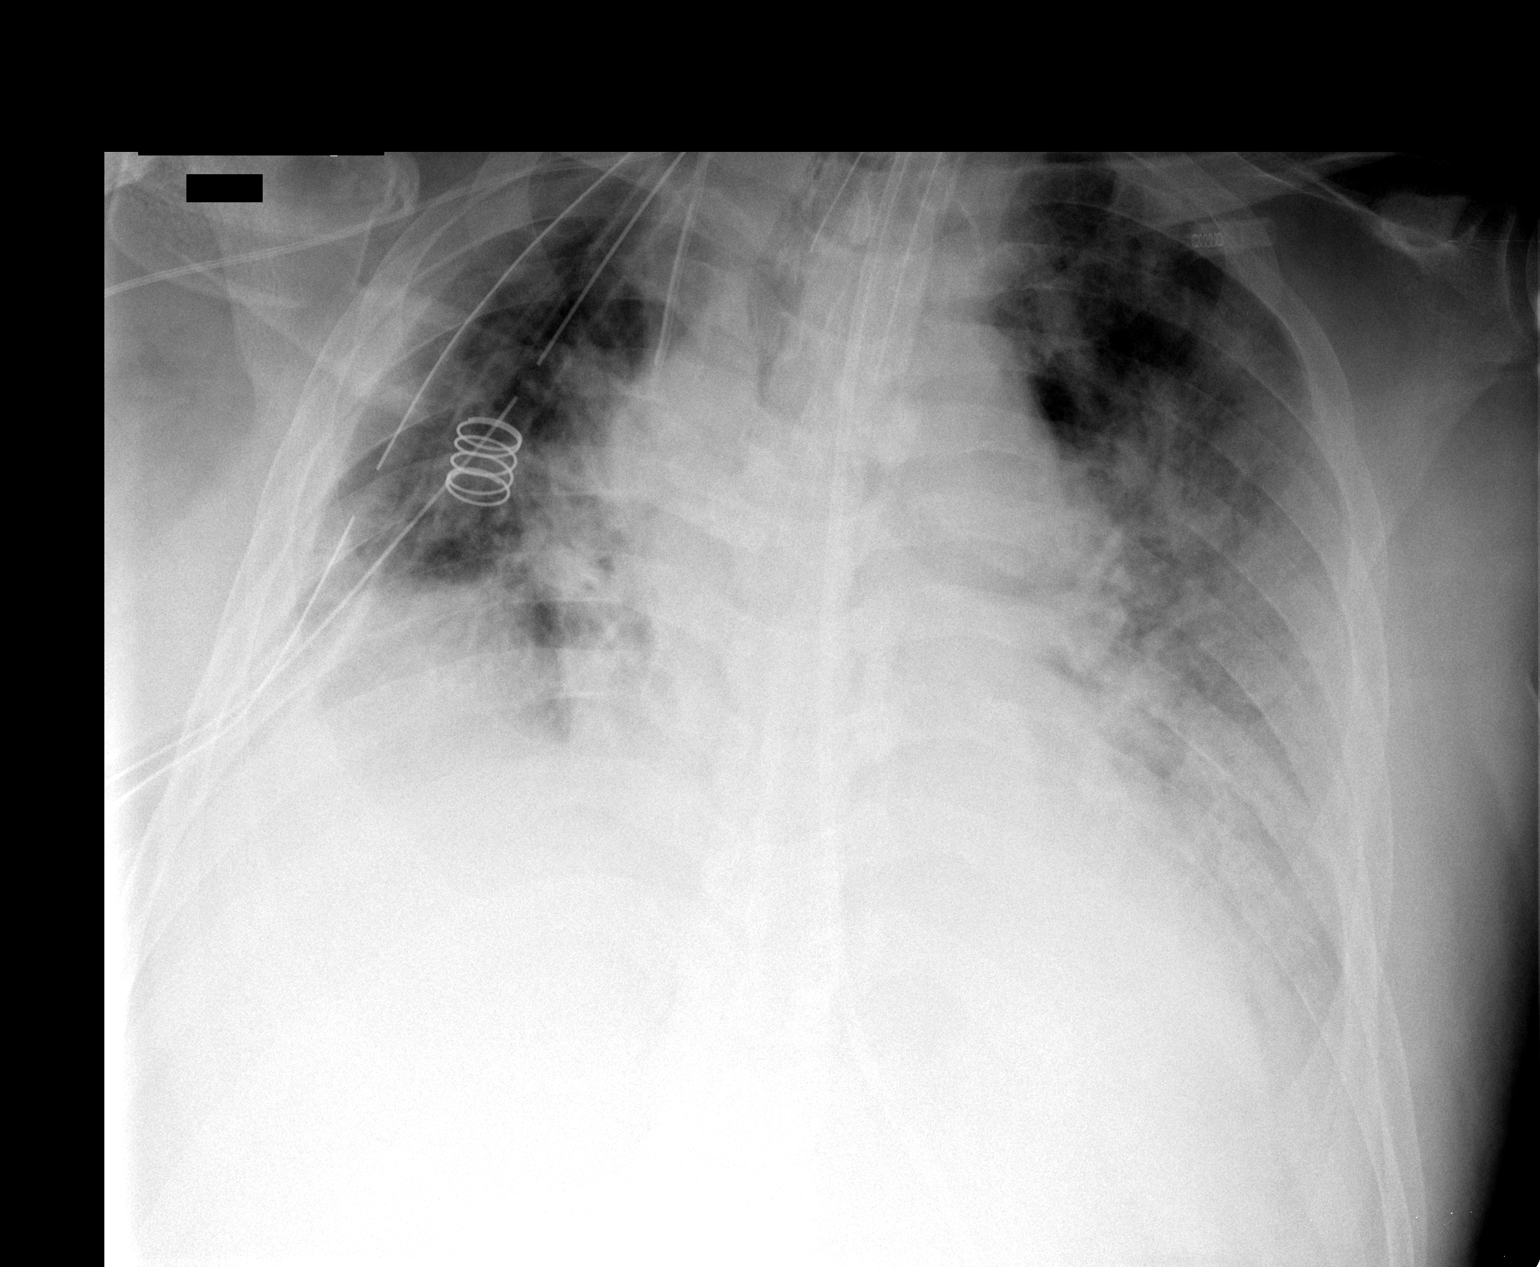

[1 of 1 positions shown; findings below may reference images not displayed]

FINDINGS: The right PICC line tip course in the cephalad direction into the right internal jugular vein.  The right IJ central line tip  is at the superior vena cava, unchanged.  The remainder of the supportive devices are also unchanged and in good position.  Pulmonary edema has worsened, particularly on the left.  The bilateral pleural effusions are slightly larger.
IMPRESSION: 1.  Right PICC line tip remains over the right internal jugular vein.  
2.  Worsening edema and bilateral pleural effusions.

## 2008-09-25 IMAGING — US US RENAL PORT
1 series · 14 of 25 positions shown · non-contrast
Comparison: none

CLINICAL DATA: Lung mass.  Hydronephrosis, elevated creatinine, hypertension.  
 RENAL/URINARY TRACT ULTRASOUND:
TECHNIQUE: Complete ultrasound examination of the urinary tract was performed including evaluation of the kidneys, renal collecting systems, and urinary bladder.

[Series 1: unknown · 0.40mm/px · 14 of 28 slices shown]
[im 1/28]
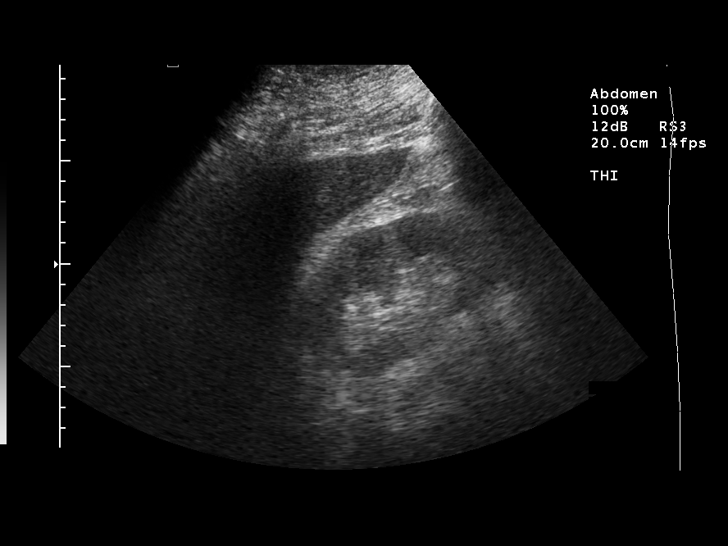
[im 3/28]
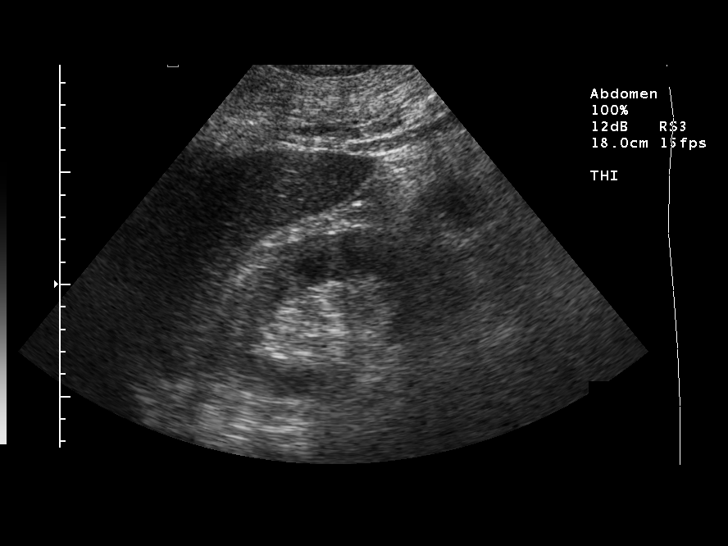
[im 5/28]
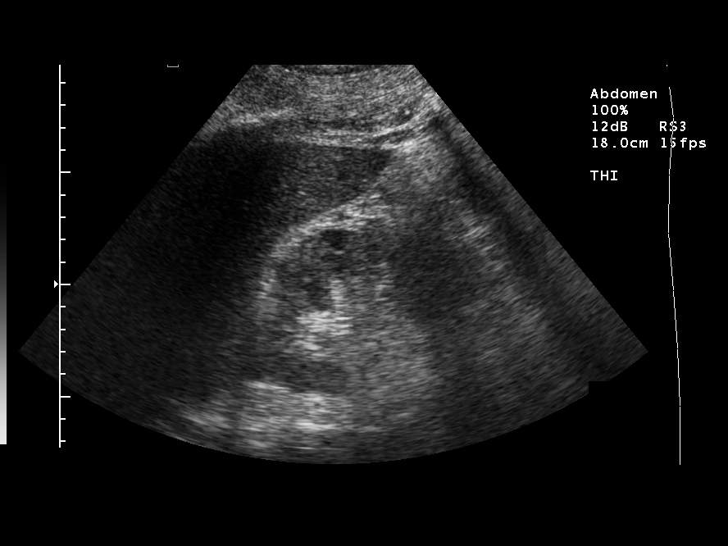
[im 7/28]
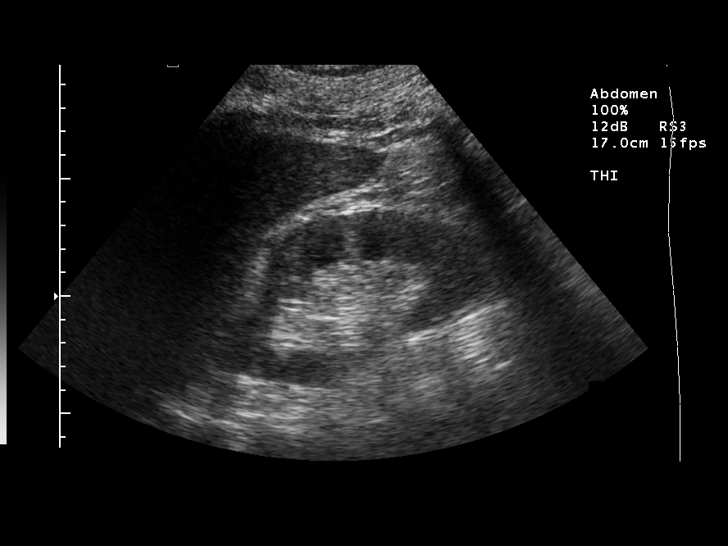
[im 10/28]
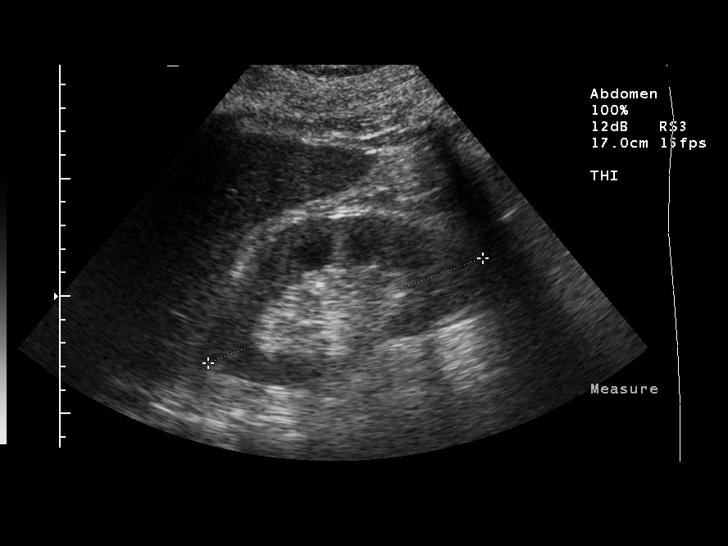
[im 11/28]
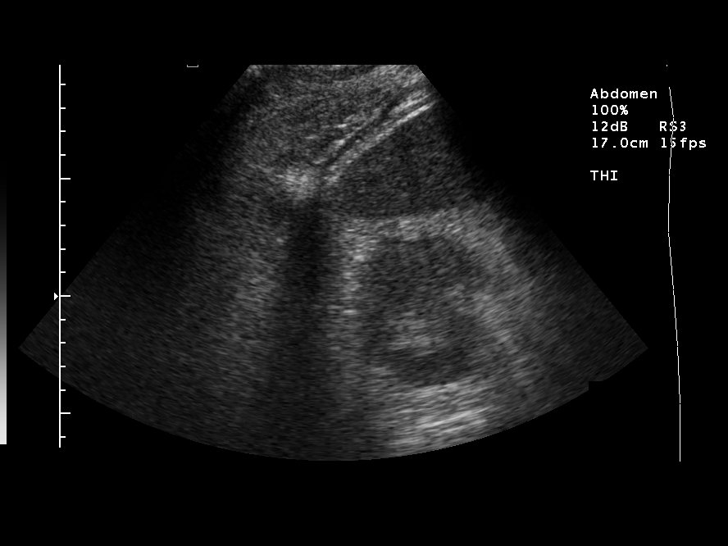
[im 13/28]
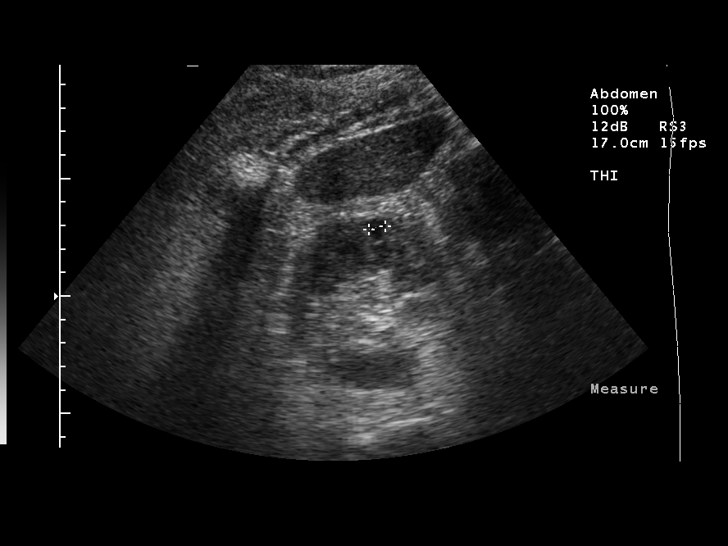
[im 15/28]
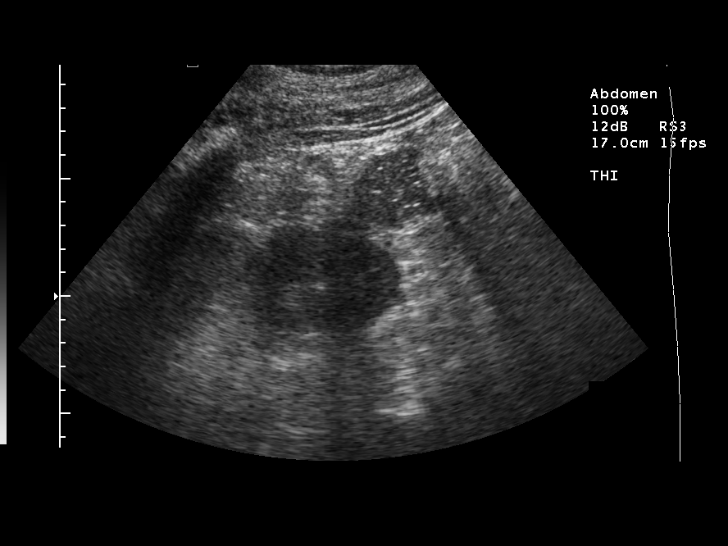
[im 17/28]
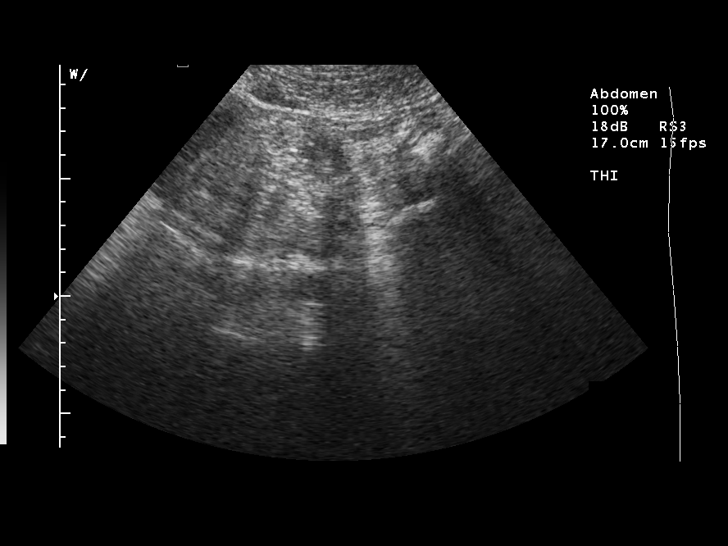
[im 19/28]
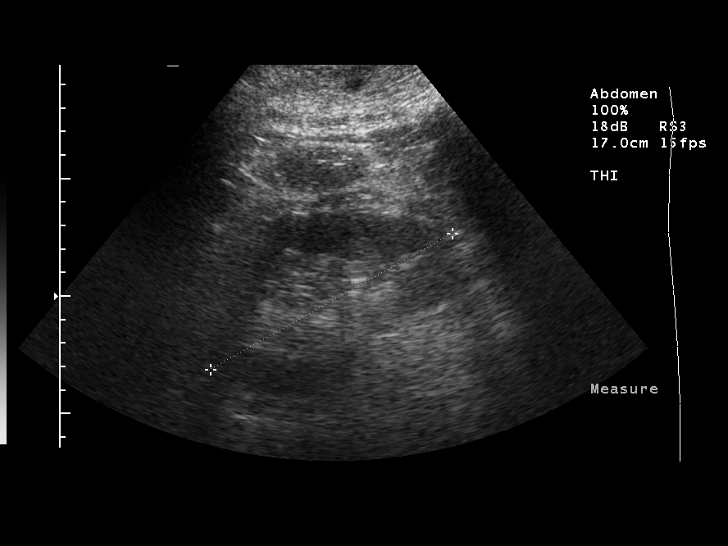
[im 21/28]
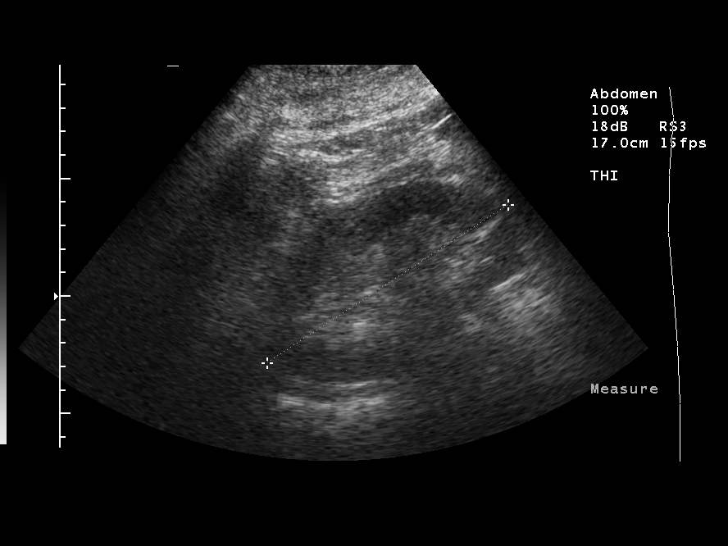
[im 23/28]
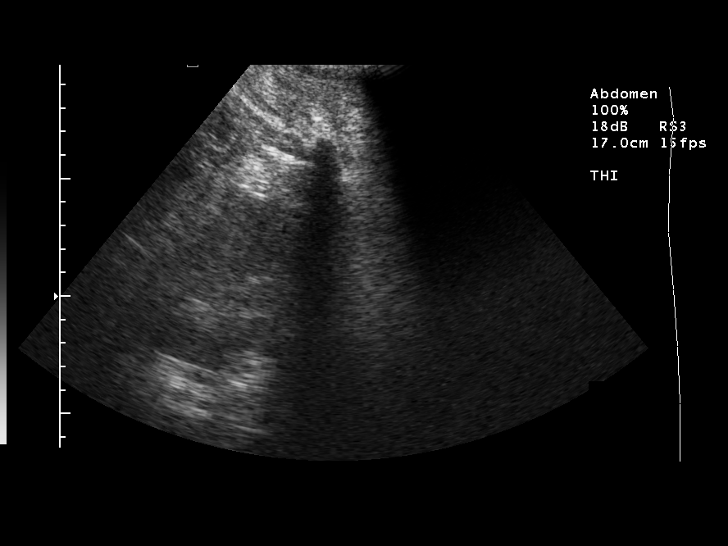
[im 25/28]
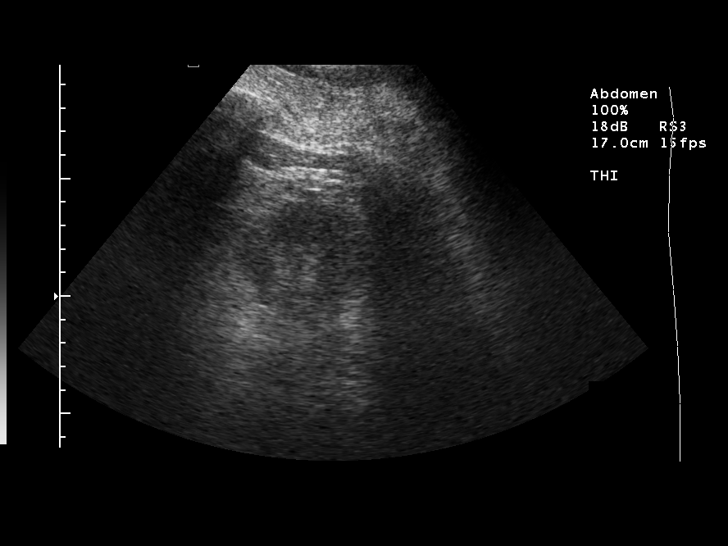
[im 28/28]
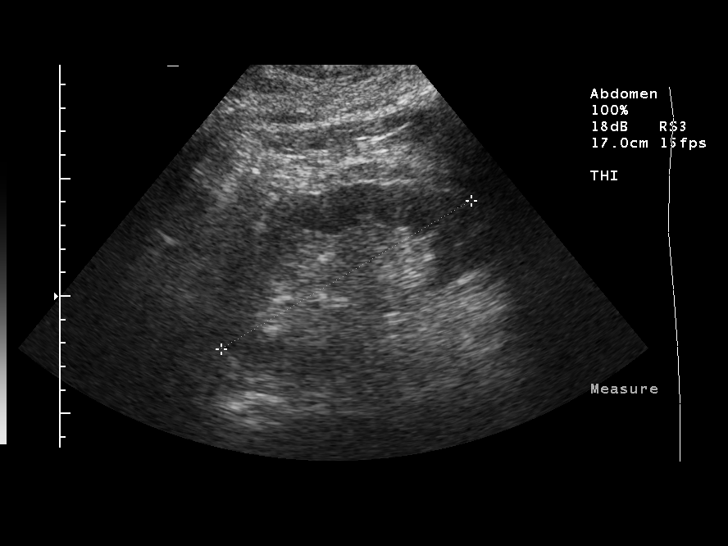

[14 of 25 positions shown; findings below may reference images not displayed]

FINDINGS: Both kidneys are symmetric in size and appearance with the right kidney measuring 12.7 cm, and the left kidney measuring 12.4 cm.  There is a 7 mm simple renal cortical cyst at the mid portion of the right kidney.  No hydronephrosis, shadowing calculi, or solid renal masses are seen.
IMPRESSION: 7 mm simple cyst at the mid portion of the right kidney.  The exam is otherwise normal.

## 2008-09-25 IMAGING — CR DG CHEST 1V PORT
1 series · 1 of 1 positions shown · non-contrast
Comparison: none

CLINICAL DATA: Postop from resection of right lung mass. PICC line placement. 
PORTABLE CHEST - 1 VIEW ? 05/06/06 AT 6636 HOURS:

[view not recorded]
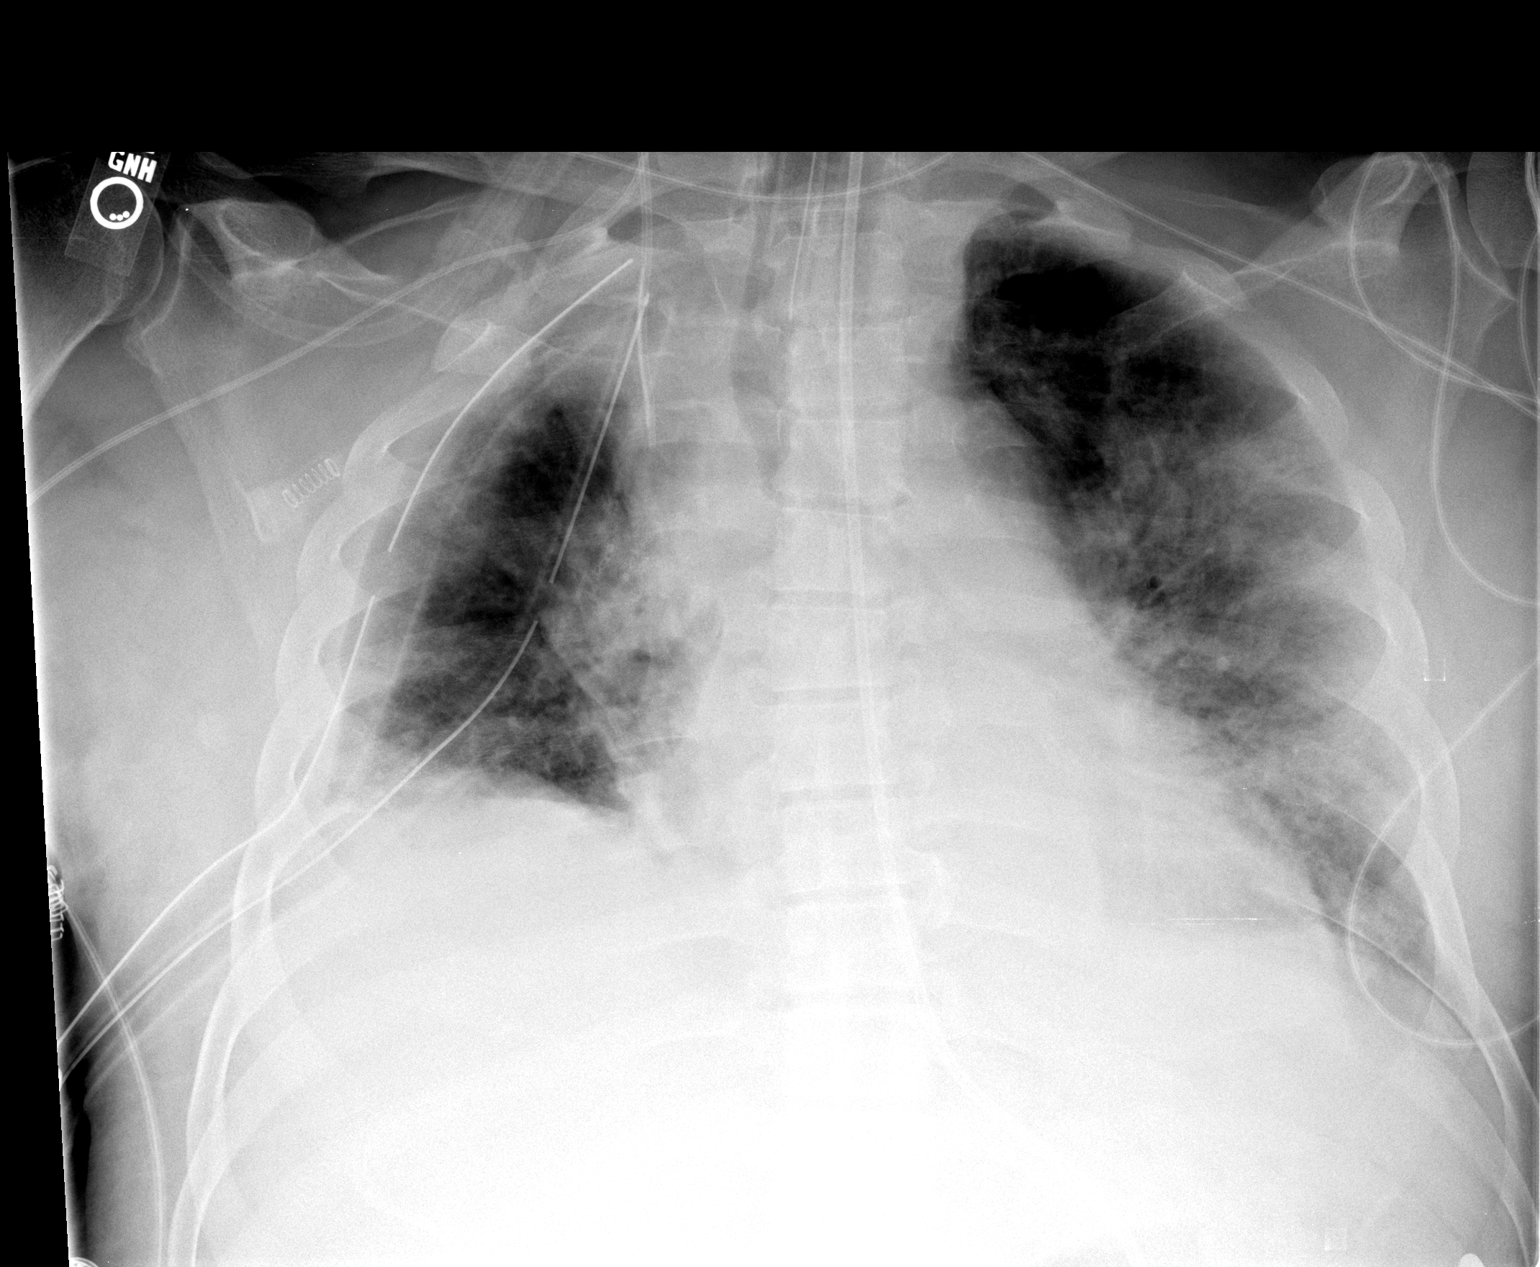

[1 of 1 positions shown; findings below may reference images not displayed]

FINDINGS: Compared to a prior study today at 7354 hours, there has been placement of a right arm PICC line, which is seen extending cephalad within the right internal jugular vein.
Other support lines and tubes are unchanged in position. There is no evidence of pneumothorax.
A small right pleural effusion is unchanged.  Diffuse interstitial infiltrates are again seen and consistent with interstitial edema.  Mediastinal widening remains stable as well as cardiomegaly.
IMPRESSION: 1.  New right jugular central venous catheter extends cephalad within the right internal jugular vein. 
2.  Stable small right pleural effusion and diffuse pulmonary edema.

## 2008-09-25 IMAGING — CR DG CHEST 1V PORT
1 series · 1 of 1 positions shown · non-contrast
Comparison: none

CLINICAL DATA: PICC line repositioning, lung mass.  
 PORTABLE CHEST ? 1 VIEW:

[view not recorded]
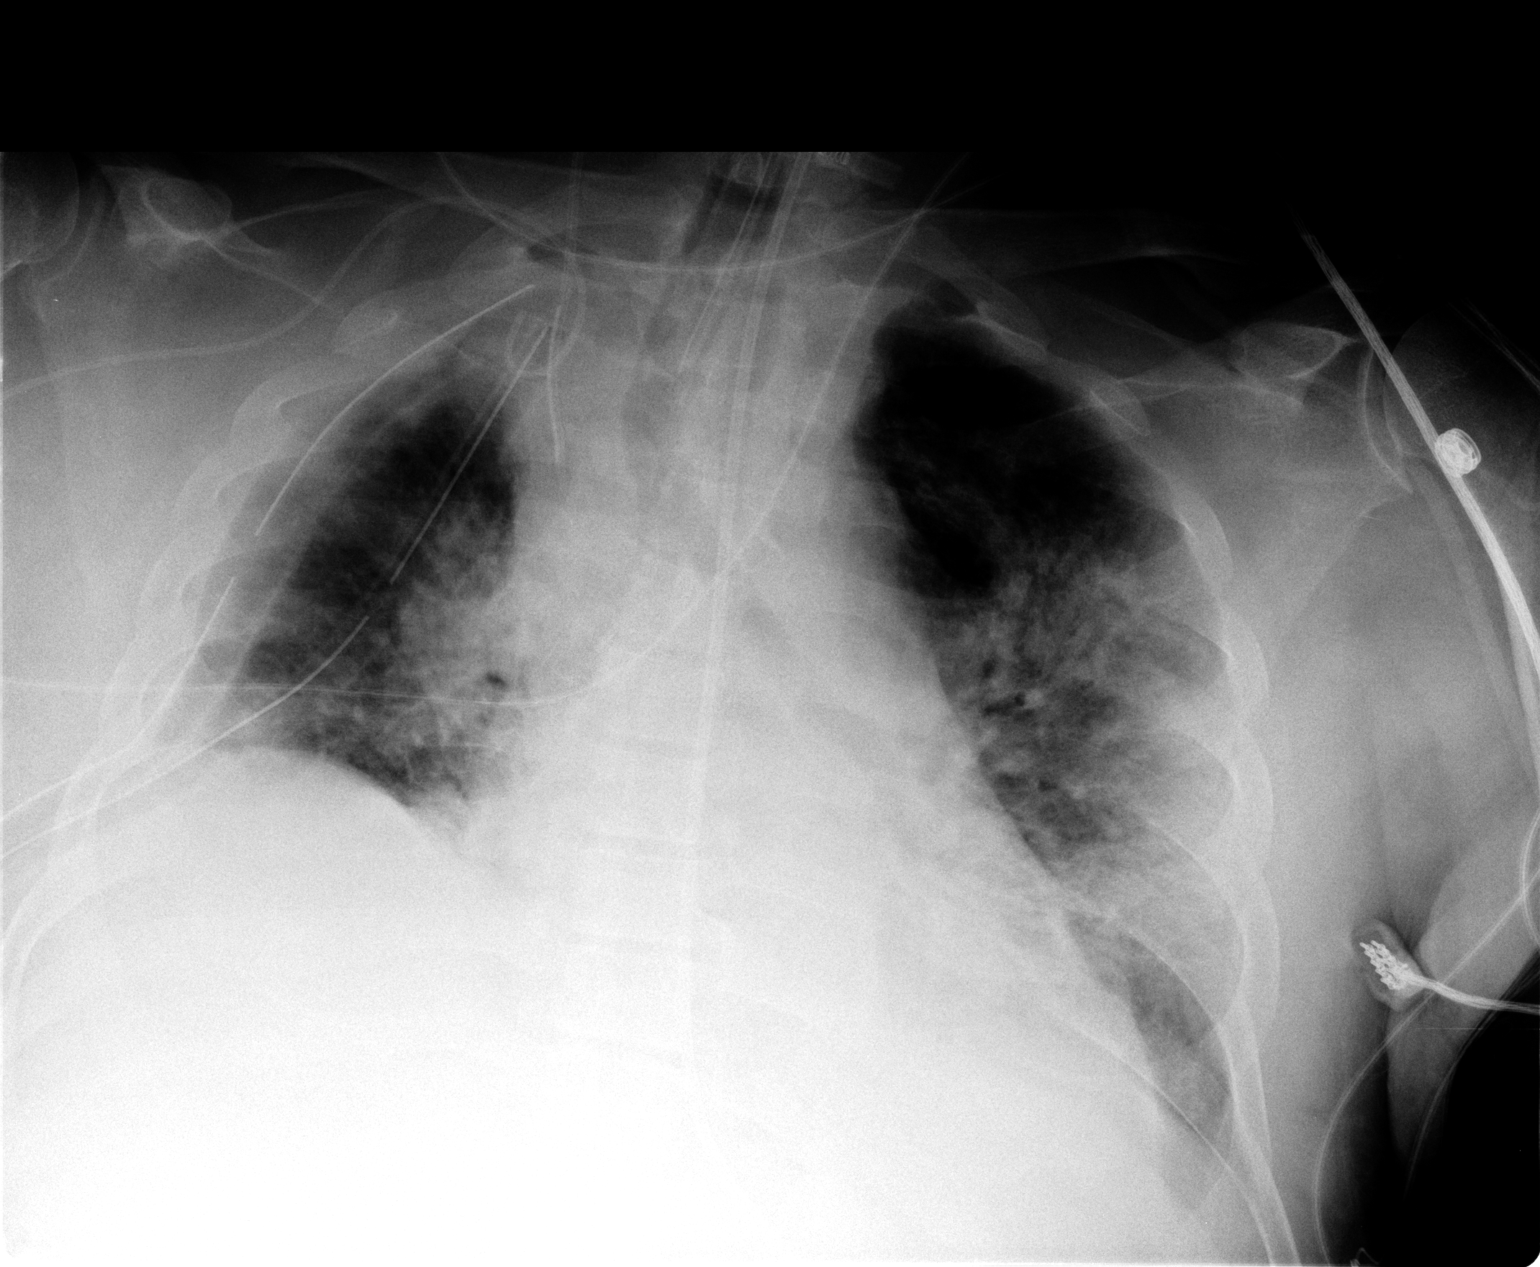

[1 of 1 positions shown; findings below may reference images not displayed]

FINDINGS: The right PICC line tip now overlies the right lung apex, probably coiled within the superior vena cava.  The remainder of the supportive devices is unchanged.  The edema pattern is stable.
IMPRESSION: Right PICC line probably within the superior vena cava.  No pneumothorax.

## 2008-09-25 IMAGING — CR DG CHEST 1V PORT
1 series · 1 of 1 positions shown · non-contrast
Comparison: 05/06/06.

CLINICAL DATA: 60-year-old male with PICC line placement, malpositioned.  
 PORTABLE CHEST ? 1 VIEW ? 05/06/06

[view not recorded]
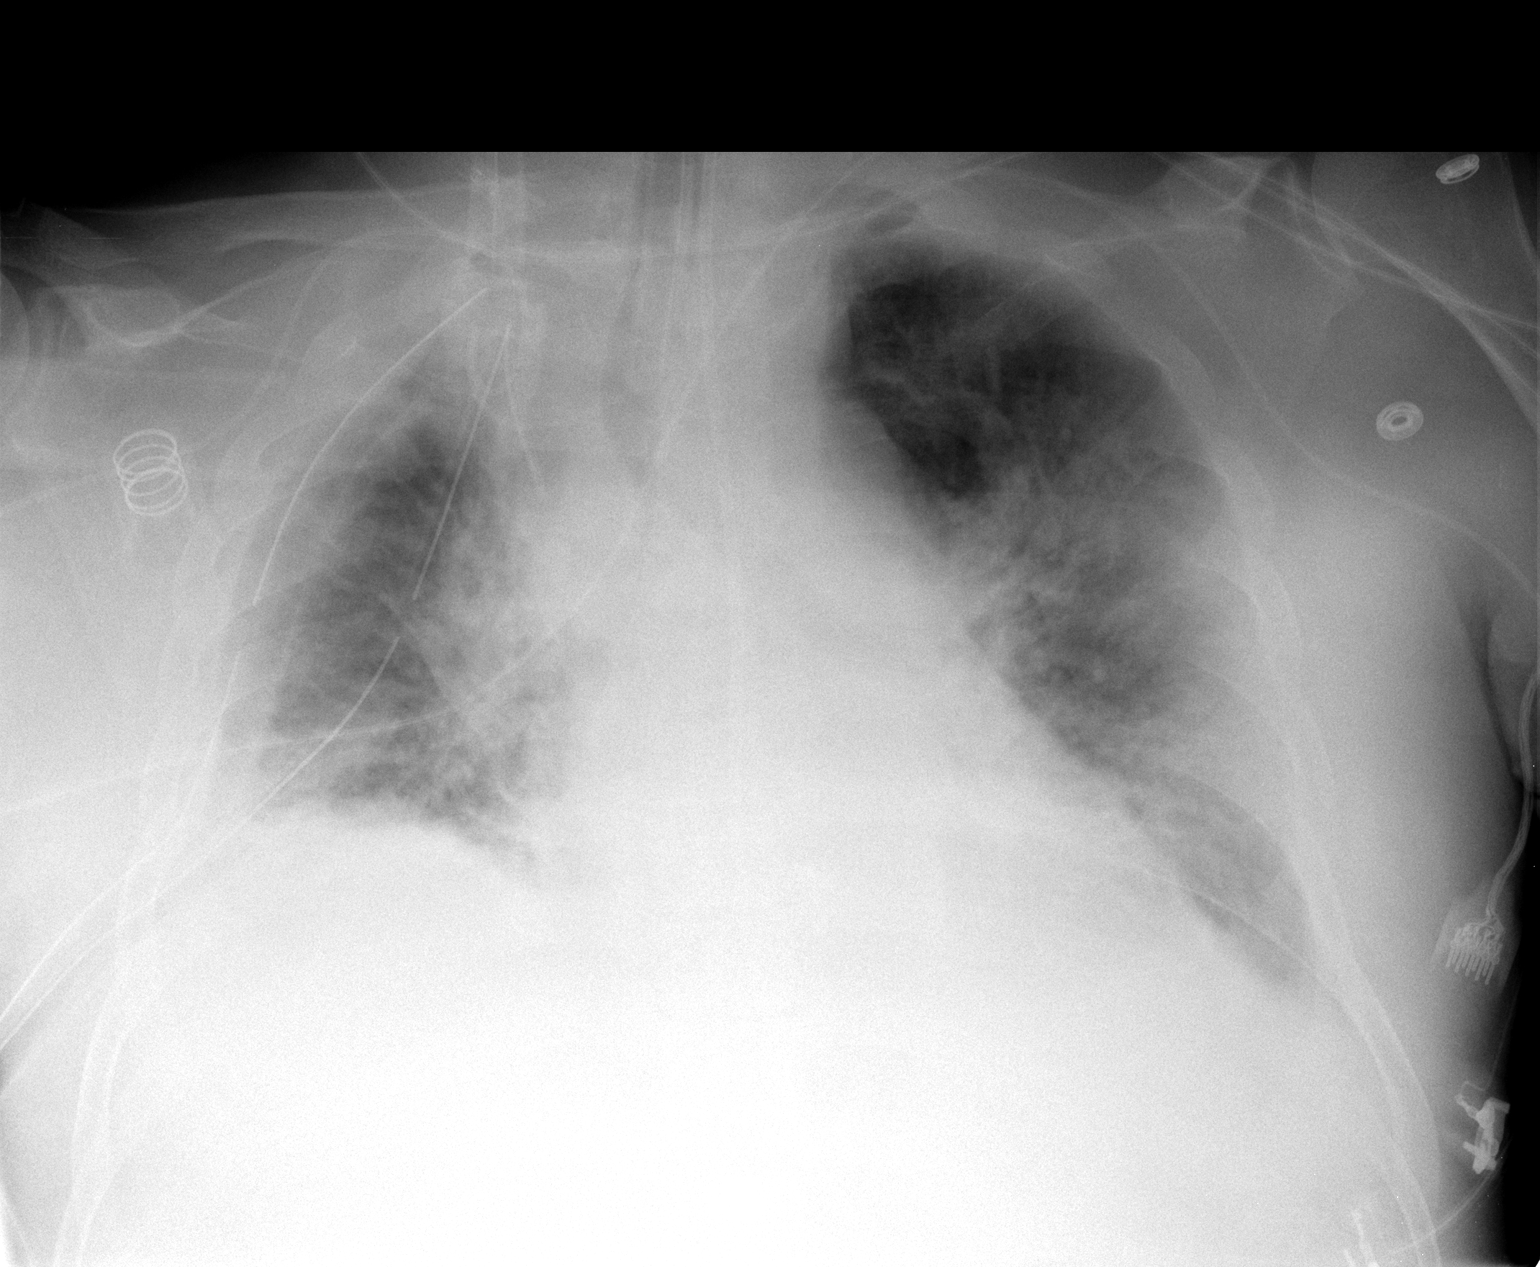

[1 of 1 positions shown; findings below may reference images not displayed]

FINDINGS: Right PICC line tip remains coiled within the right innominate/SVC junction.  Right IJ central line, chest tubes, endotracheal tube and feeding tube all remain.  Perihilar and lower lobe edema/atelectasis persists.  No definite pneumothorax.
IMPRESSION: Right PICC line tip remains coiled in the innominate/SVC junction region.

## 2008-09-25 IMAGING — CR DG CHEST 1V PORT
1 series · 1 of 1 positions shown · non-contrast
Comparison: none

CLINICAL DATA: Status post thoracotomy.  PICC line insertion.
 PORTABLE CHEST ? 1 VIEW:

[view not recorded]
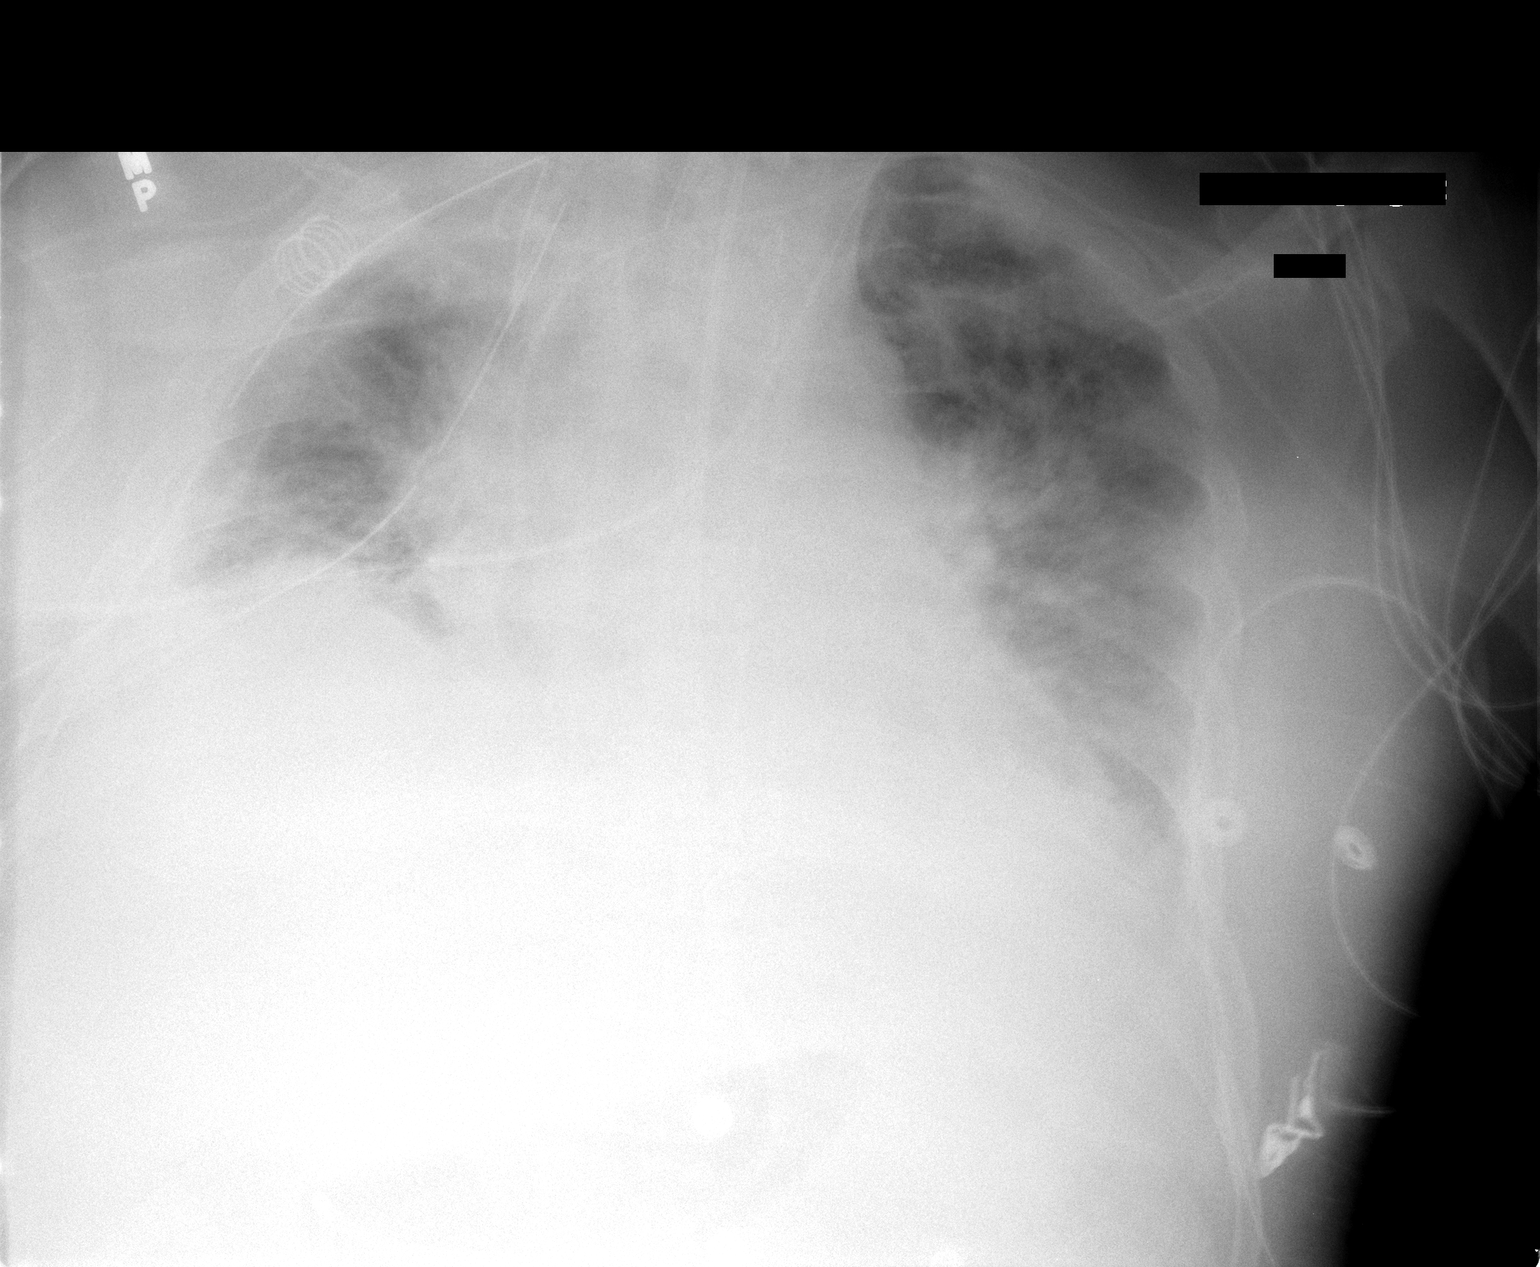

[1 of 1 positions shown; findings below may reference images not displayed]

FINDINGS: Left PICC line tip is in the SVC/RA junction.  Right PICC line tip remains coiled in the right innominate/SVC region.  No other change of support apparatus.  Lung volumes rema[REDACTED]reased with worsening diffuse edema versus airspace disease. Persistent bibasilar atelectasis.
IMPRESSION: 1. Right PICC line tip remains coiled in the innominate/proximal SVC region.
 2. Left PICC line tip is within the SVC/RA junction.

## 2008-09-26 IMAGING — CR DG CHEST 1V PORT
1 series · 1 of 1 positions shown · non-contrast
Comparison: 05/06/06 at 3653 hours.

CLINICAL DATA: Lung mass status post VATS.
 PORTABLE CHEST - 1 VIEW, 05/07/06 AT 5553 HOURS:

[view not recorded]
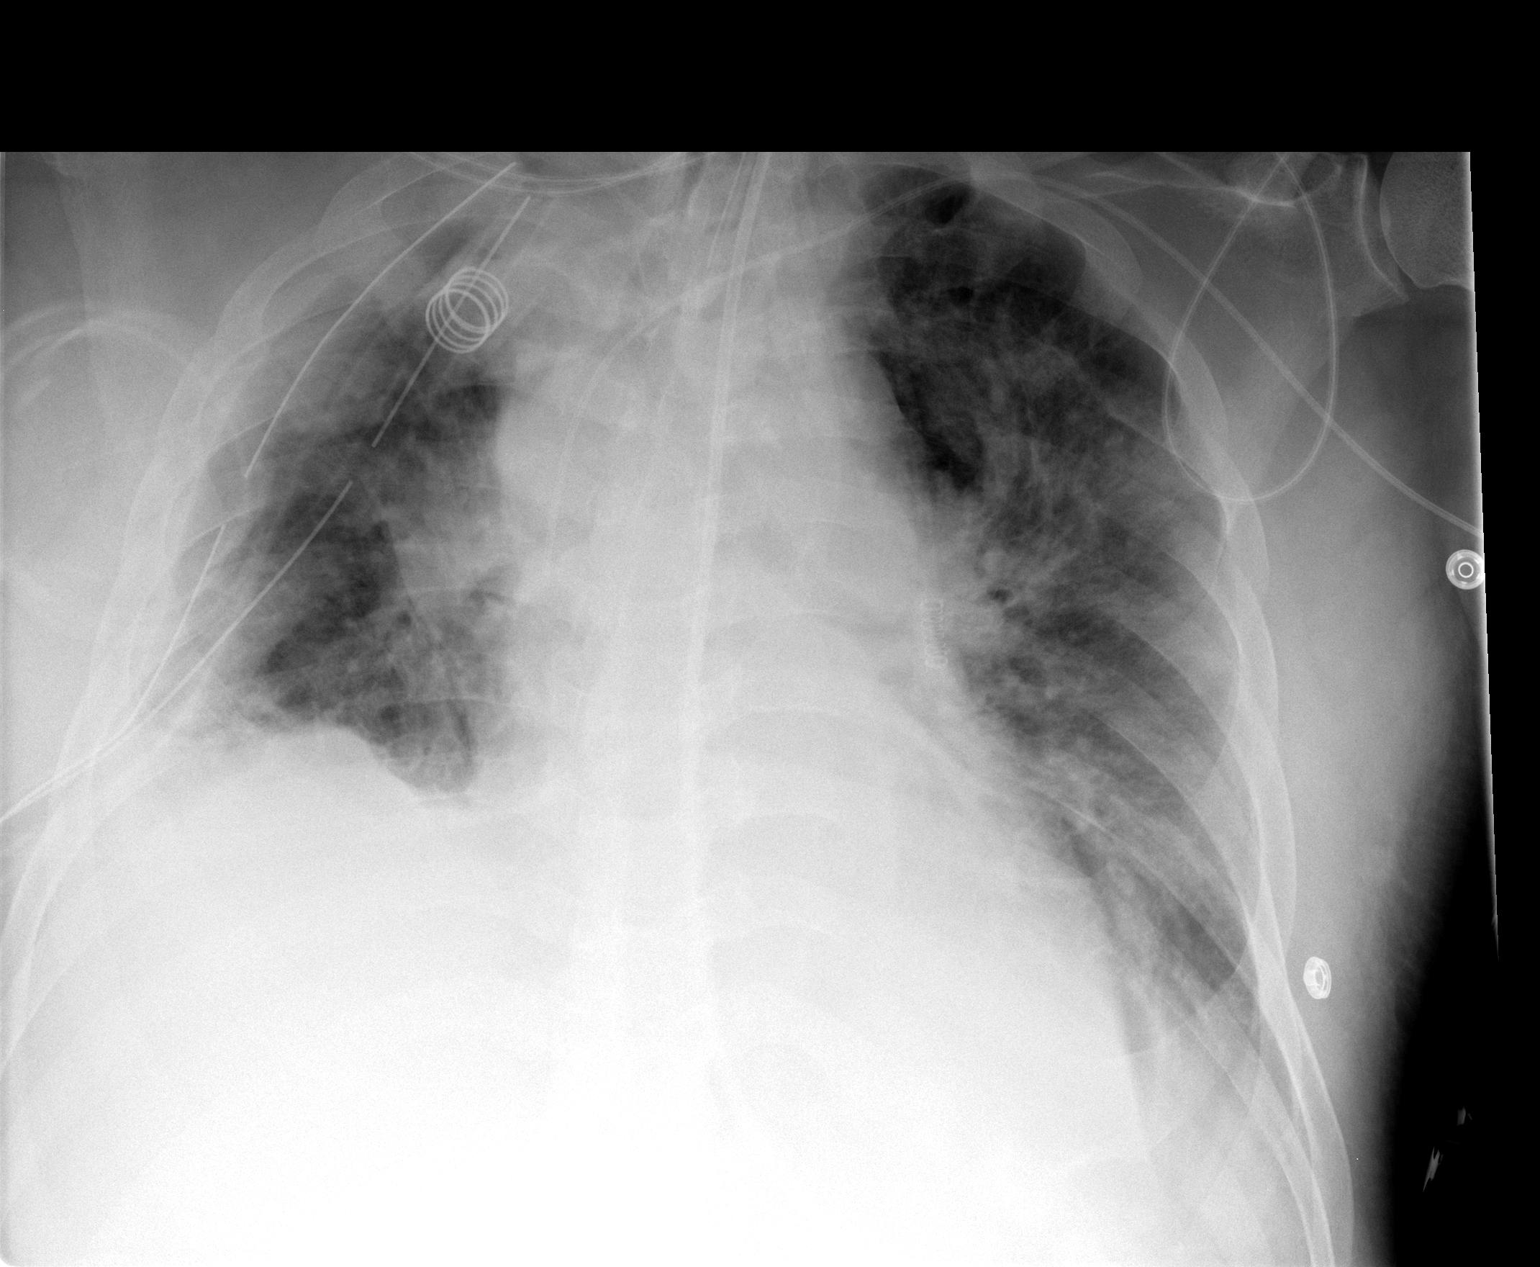

[1 of 1 positions shown; findings below may reference images not displayed]

FINDINGS: Two right-sided chest tubes are in place.  No pneumothorax.  Left PICC line tip overlies the distal superior vena cava.  Endotracheal tube tip 1.9 cm above the carina.  This projects slightly towards the left because of mild patient rotation.  Right PICC line has been removed.  Panda tube courses below the diaphragm.  Pulmonary vascular congestion.  Right-sided pleural effusion.  Elevated right hemidiaphragm.  Opacification of lung bases may represent atelectasis.
IMPRESSION: 1.  Right PICC line has been removed.  
 2.  Endotracheal tube projects slightly laterally in reference to the trachea, which may be related to projection.  Floor has been contacted.  
 3.  Remainder of findings relatively similar to that of prior exam.

## 2008-09-26 IMAGING — CR DG ABD PORTABLE 1V
1 series · 1 of 1 positions shown · non-contrast
Comparison: none

CLINICAL DATA: Lung mass, feeding tube placement. 
 PORTABLE ABDOMEN - 1 VIEW:

[view not recorded]
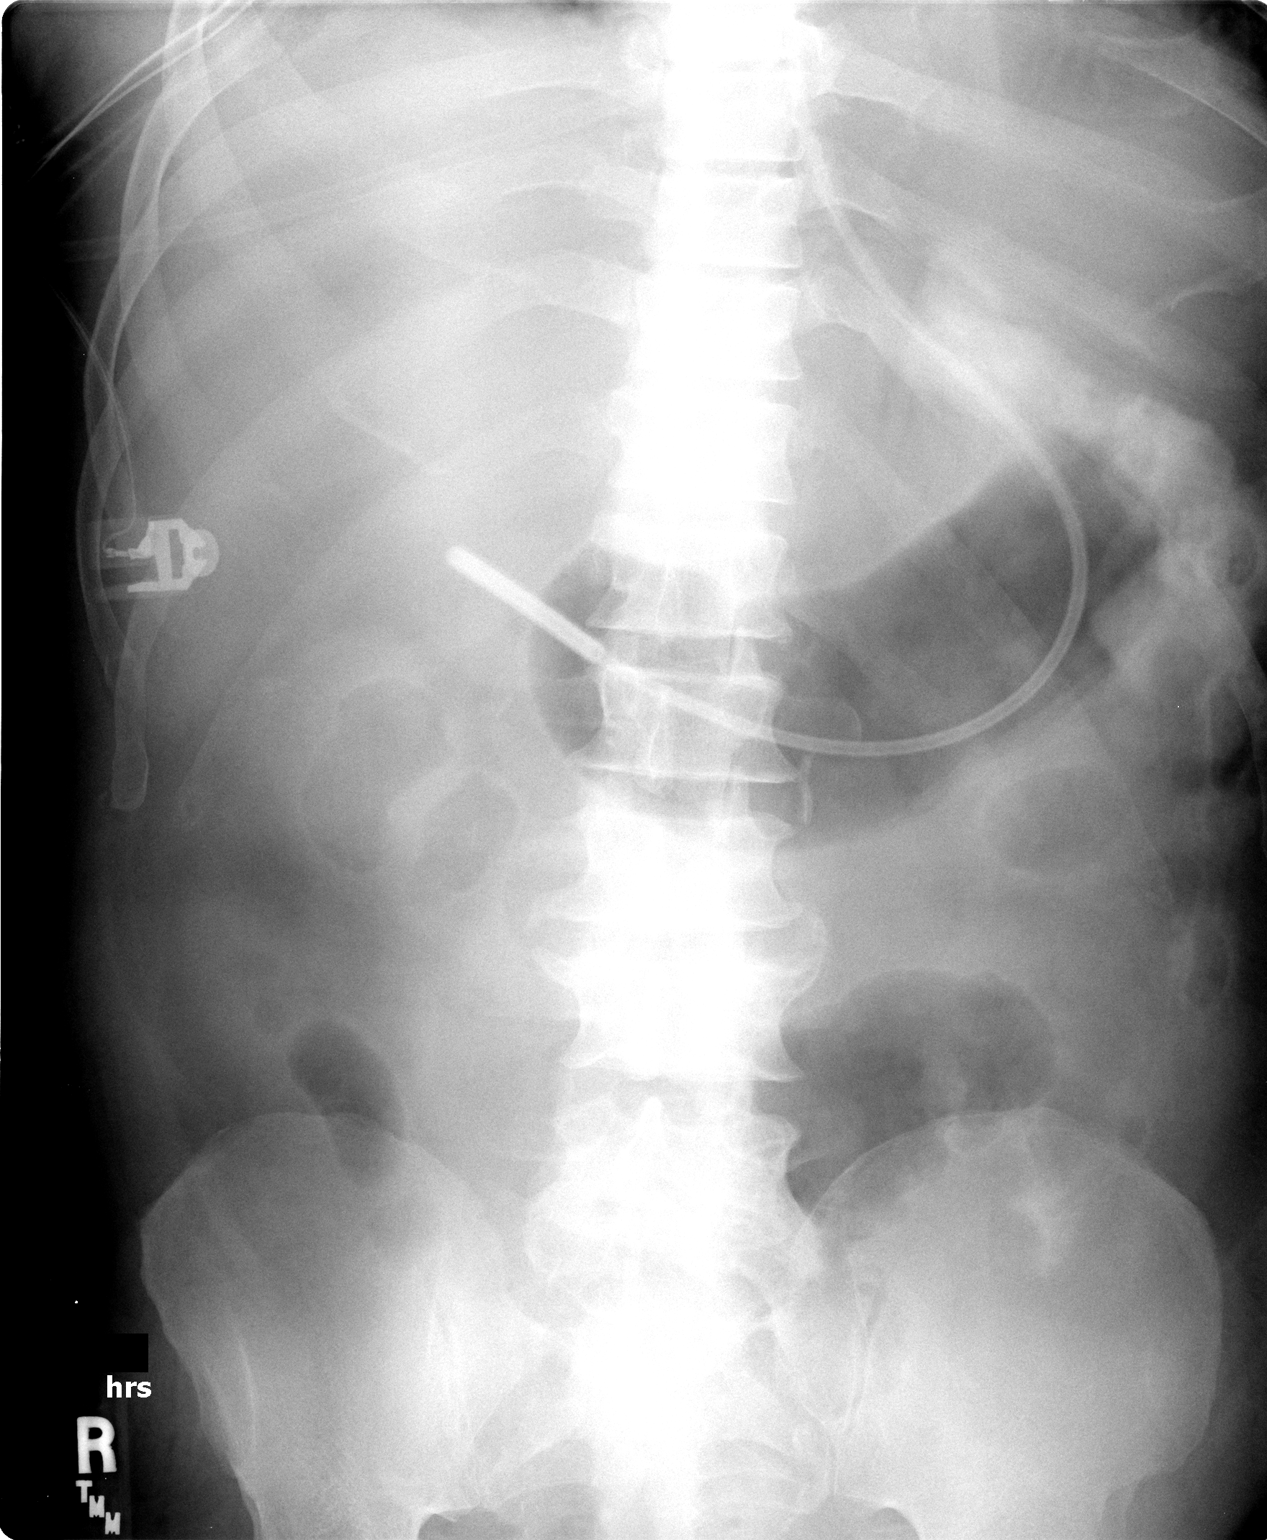

[1 of 1 positions shown; findings below may reference images not displayed]

FINDINGS: Feeding tube tip is at the pylorus.
IMPRESSION: Feeding tube is at the pylorus.

## 2008-09-27 IMAGING — CR DG CHEST 1V PORT
1 series · 1 of 1 positions shown · non-contrast
Comparison: 05/07/2006

CLINICAL DATA: Lung mass

[view not recorded]
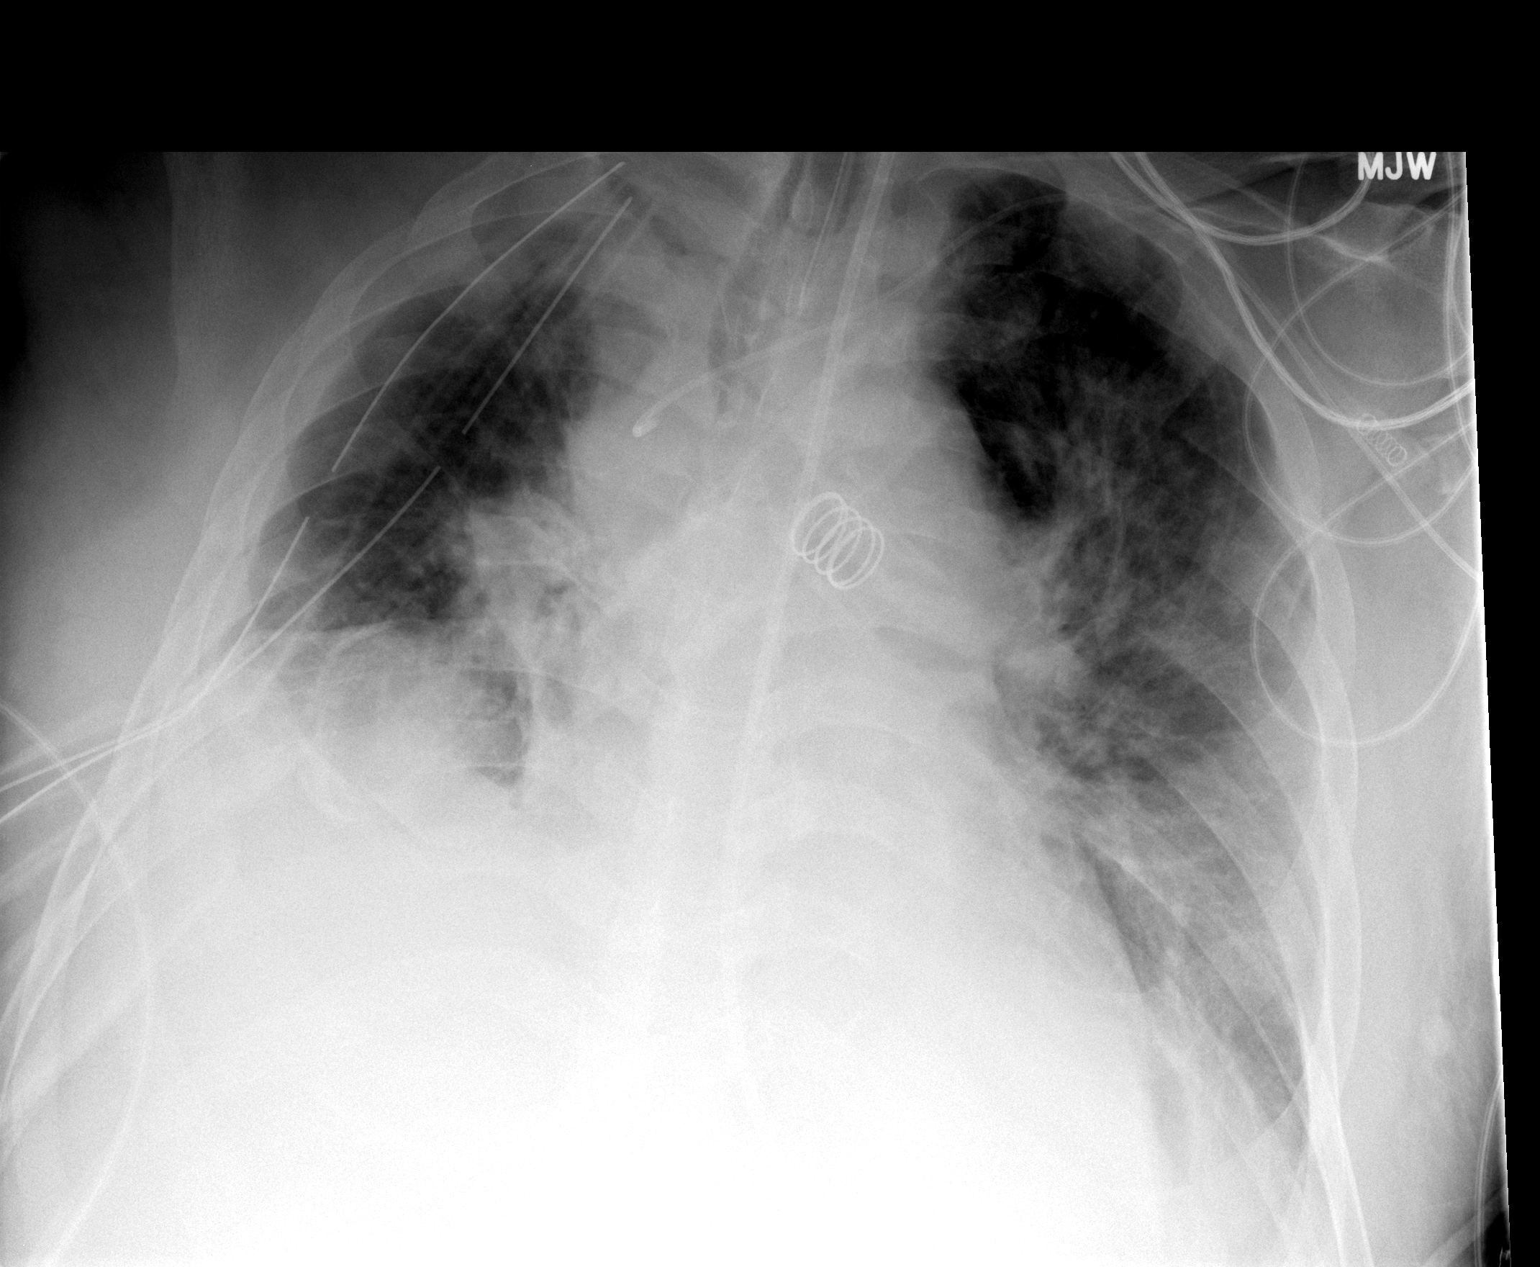

[1 of 1 positions shown; findings below may reference images not displayed]

PORTABLE CHEST - 1 VIEW:

6836 hours. Stable cardiopericardial silhouette. The diffuse interstitial and
perihilar airspace disease persists. There is bibasilar atelectasis or
infiltrate, right greater than left with asymmetric elevation of the right
hemidiaphragm. Two right chest tubes remain in place without evidence for
pneumothorax. Endotracheal tube is 3.8 cm above the base of the carina. The tip
of the feeding catheter is below the gastroesophageal junction, but not
visualized. The patient has a left PICC line with interval change in tip
position, now with the tip curled suggesting that it may be just into the azygos
vein.
IMPRESSION: Left PICC line tip is curled upon itself suggesting that it is just into the
azygos vein.

Otherwise stable exam.

## 2008-09-28 IMAGING — CR DG CHEST 1V PORT
1 series · 1 of 1 positions shown · non-contrast
Comparison: 05/08/2006

CLINICAL DATA: Lung mass

[view not recorded]
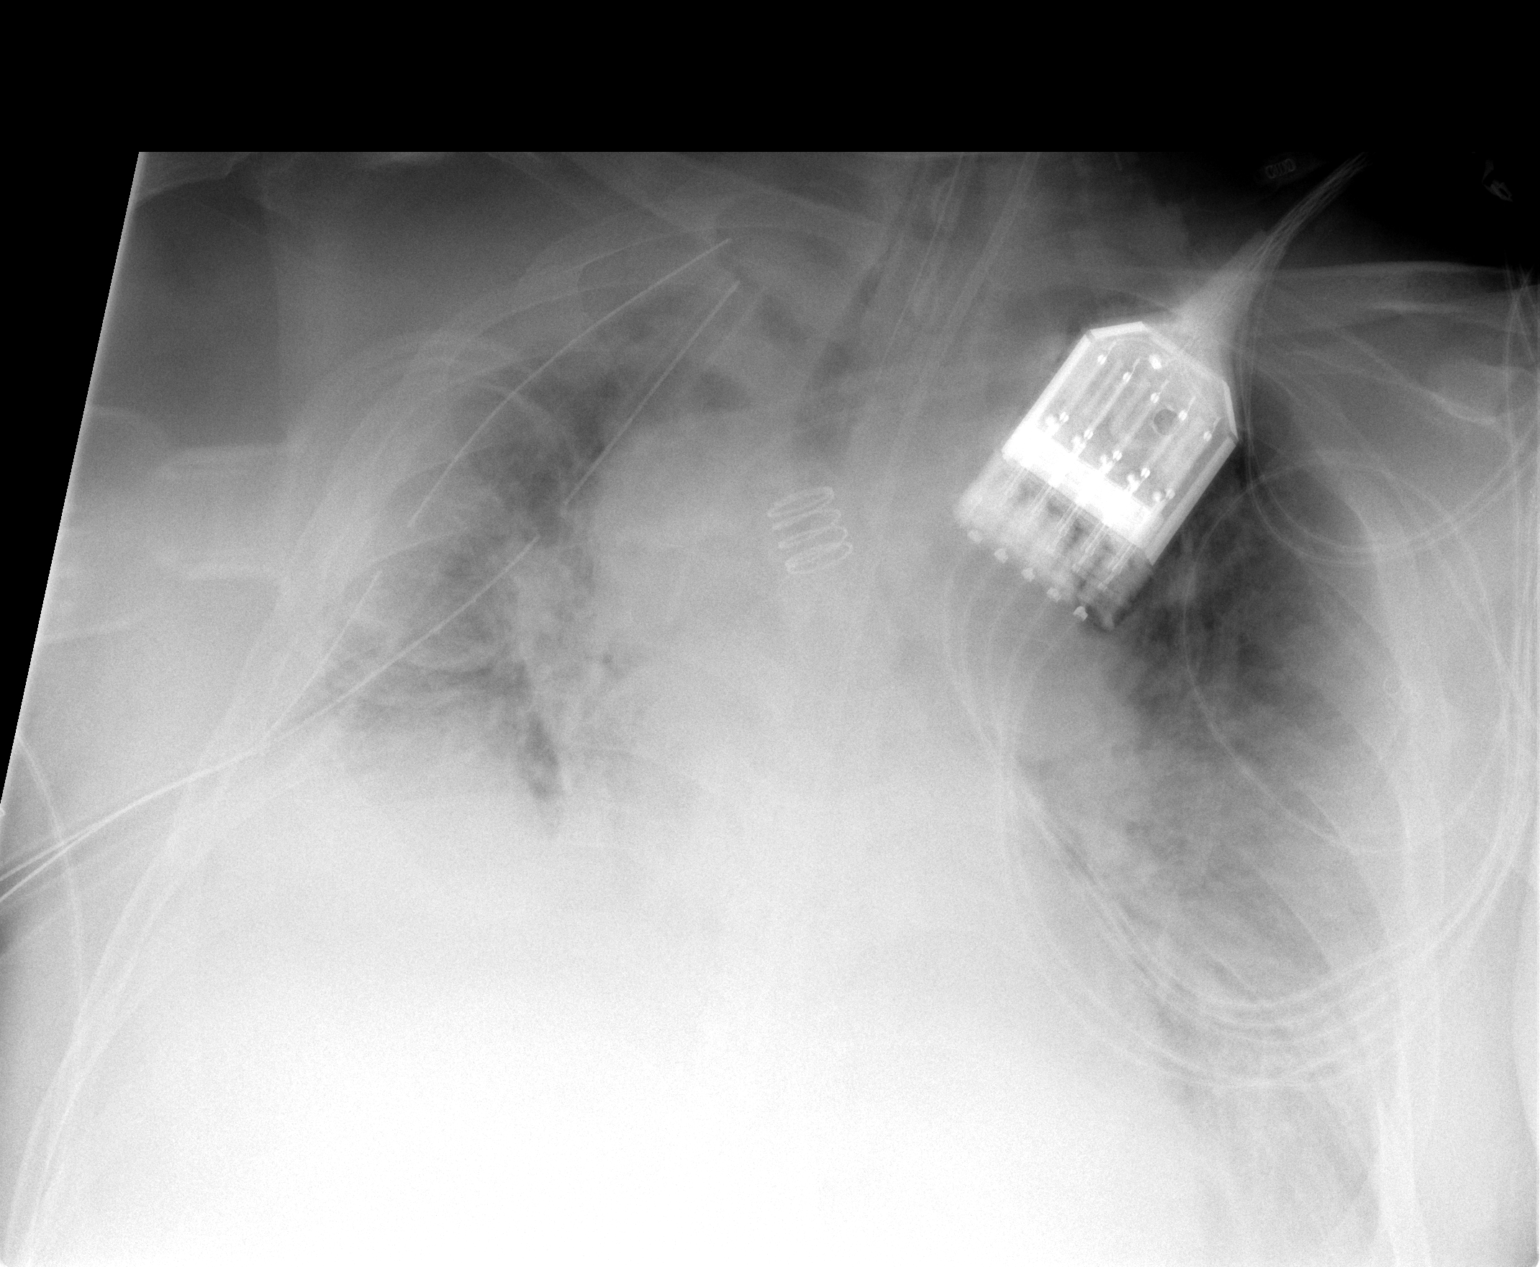

[1 of 1 positions shown; findings below may reference images not displayed]

PORTABLE CHEST - 1 VIEW:

1211 hours. Two right chest tubes remain in place with no pneumothorax apparent.
Volume loss of the right hemithorax is again noted. There is worsening pulmonary
edema pattern with bibasilar atelectasis. Endotracheal tube and feeding tube
remain in place. The left PICC line has been repositioned in the interval with
the tip now projecting at the mid SVC level. Cardiomediastinal structures remain
prominent, likely reflecting rotation and low lung volumes.
IMPRESSION: Interval repositioning of the PICC line tip.

Worsening pulmonary edema pattern.

## 2008-09-29 IMAGING — CR DG CHEST 1V PORT
1 series · 1 of 1 positions shown · non-contrast
Comparison: none

CLINICAL DATA: Lung mass

[view not recorded]
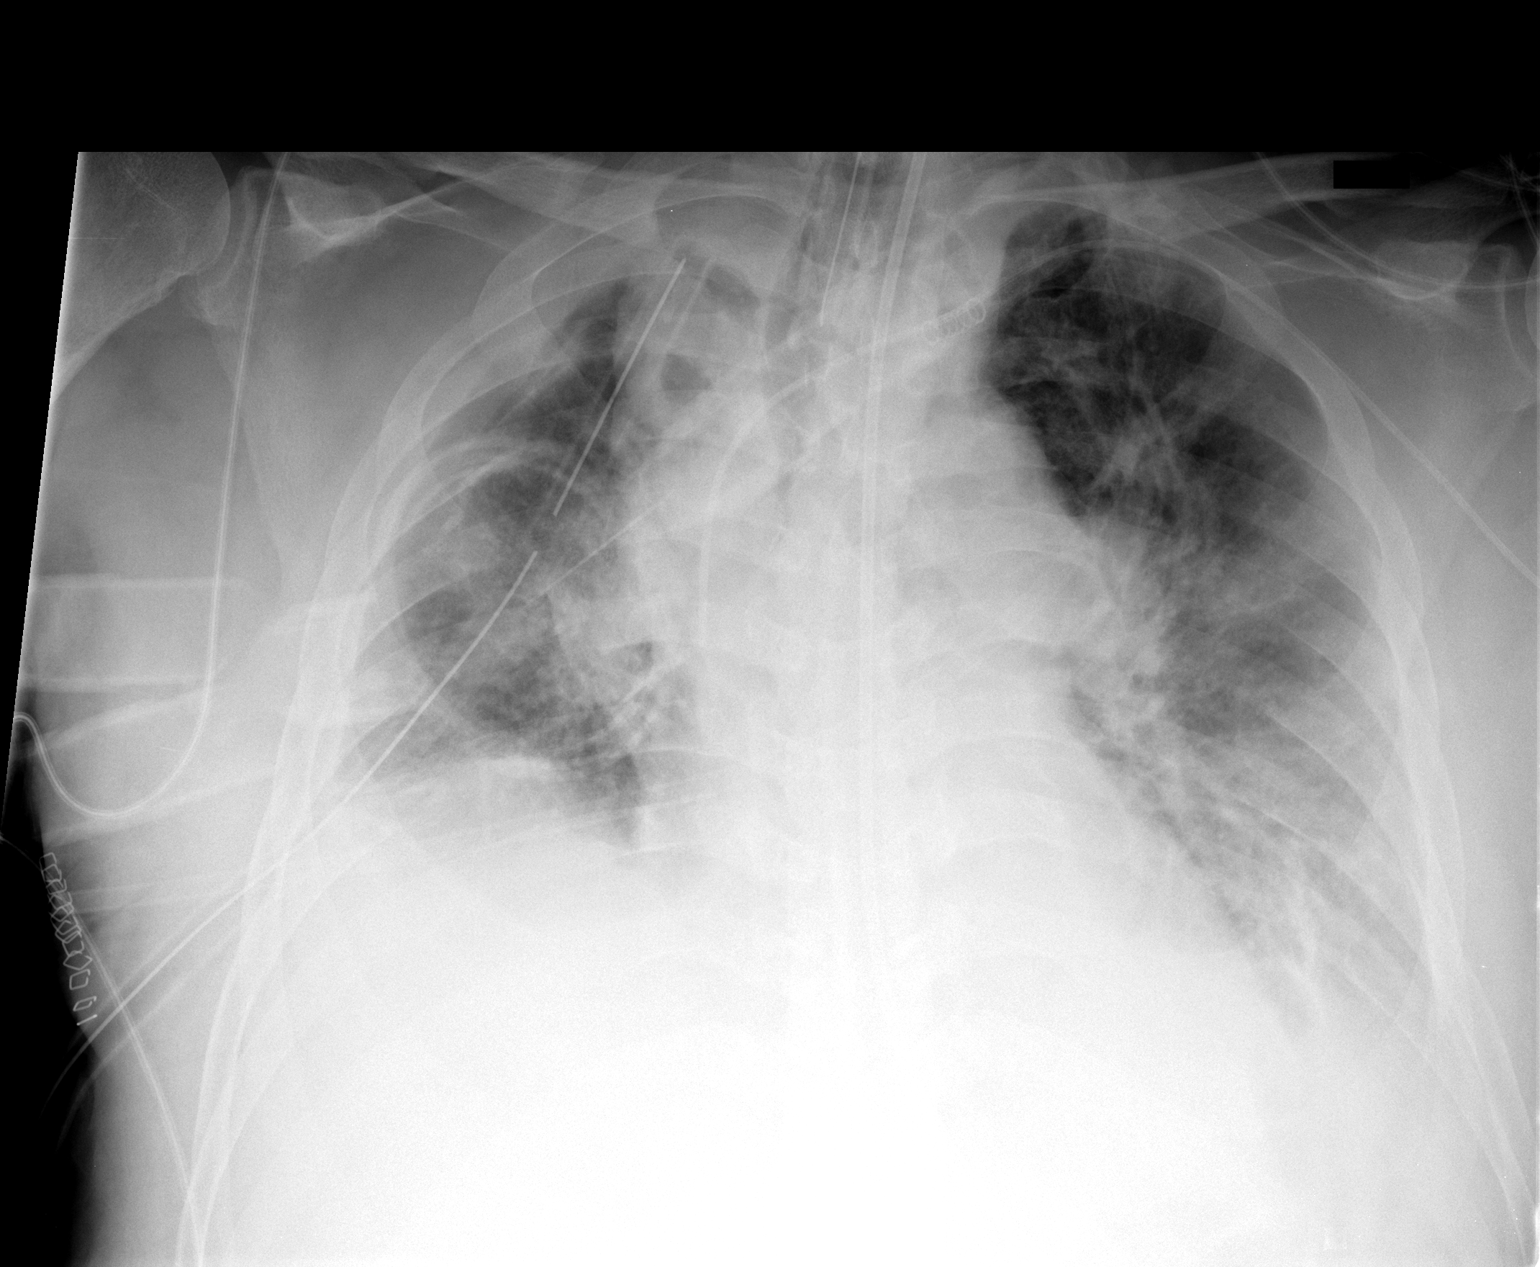

[1 of 1 positions shown; findings below may reference images not displayed]

Portable chest at [DATE]:

Comparison 05/09/2006. Interval removal of one of 2 right chest tubes with no
definite pneumothorax. There is still some right lateral and apical loculated
fluid or pleural thickening. Endotracheal tube, feeding tube,   left PICC line
stable position. Moderate interstitial and air space opacities in the perihilar
regions and lung bases slightly improved. Probable small bilateral effusions at
the lung bases. Heart size within normal limits.
IMPRESSION: 1. Removal of one of 2 right chest tubes with no pneumothorax.
2. Some minimal improvement in bilateral infiltrates or edema

## 2008-09-30 IMAGING — CR DG CHEST 1V PORT
1 series · 1 of 1 positions shown · non-contrast
Comparison: none

CLINICAL DATA: Lung mass post VATS

[view not recorded]
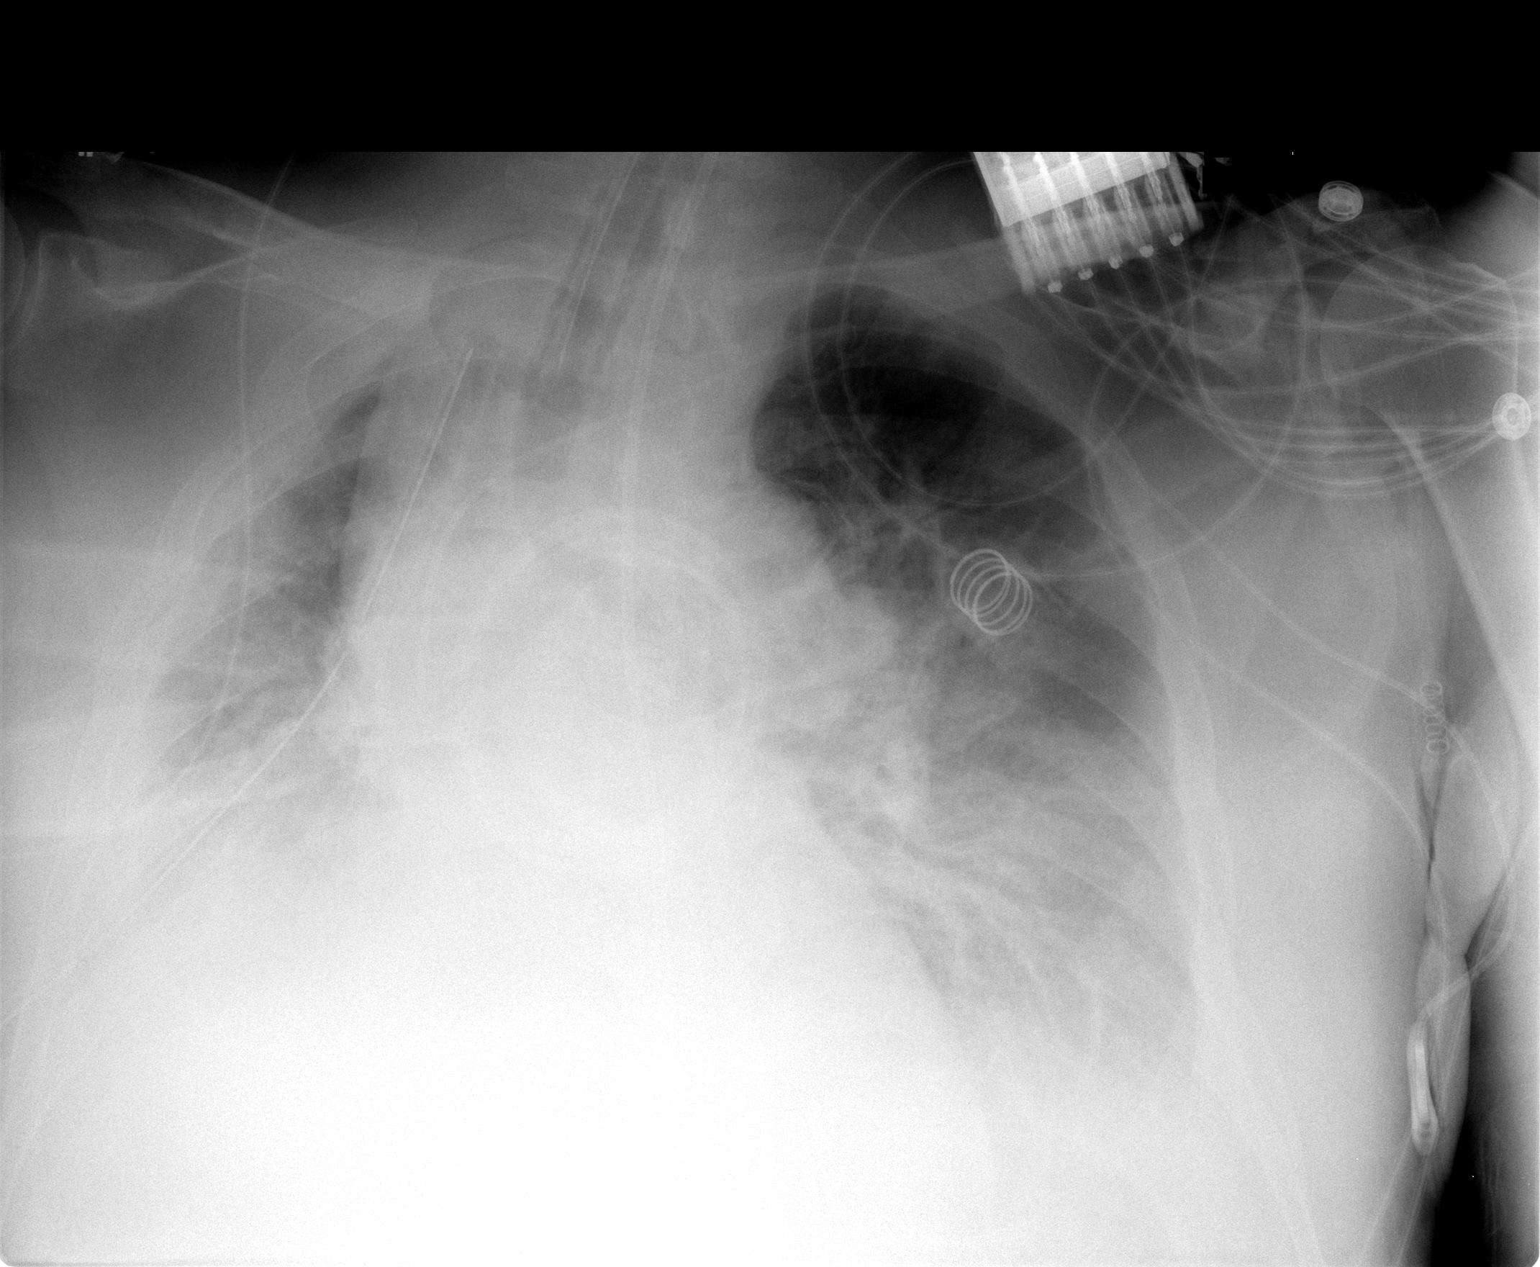

[1 of 1 positions shown; findings below may reference images not displayed]

Portable chest of 714:

Comparison 05/10/2006. Endotracheal tube, feeding tube, left arm PICC, right
chest tube stable position. No pneumothorax. Right lateral pleural thickening or
effusion. Progressive volume loss in the right lung with worsening perihilar and
basilar atelectasis or consolidation. Moderate diffuse interstitial edema or
infiltrates. Can't exclude small pleural effusions.
IMPRESSION: 1. Progressive volume loss or atelectasis in the right lung.
2. Stable bilateral edema or infiltrates.
3. Support hardware stable

## 2008-10-01 IMAGING — CR DG CHEST 1V PORT
1 series · 1 of 1 positions shown · non-contrast
Comparison: Yesterday?s exam.

CLINICAL DATA: Lung mass.  Status post   VATS.  
 PORTABLE CHEST - 1 VIEW, 05/12/06, 9093 HOURS:

[view not recorded]
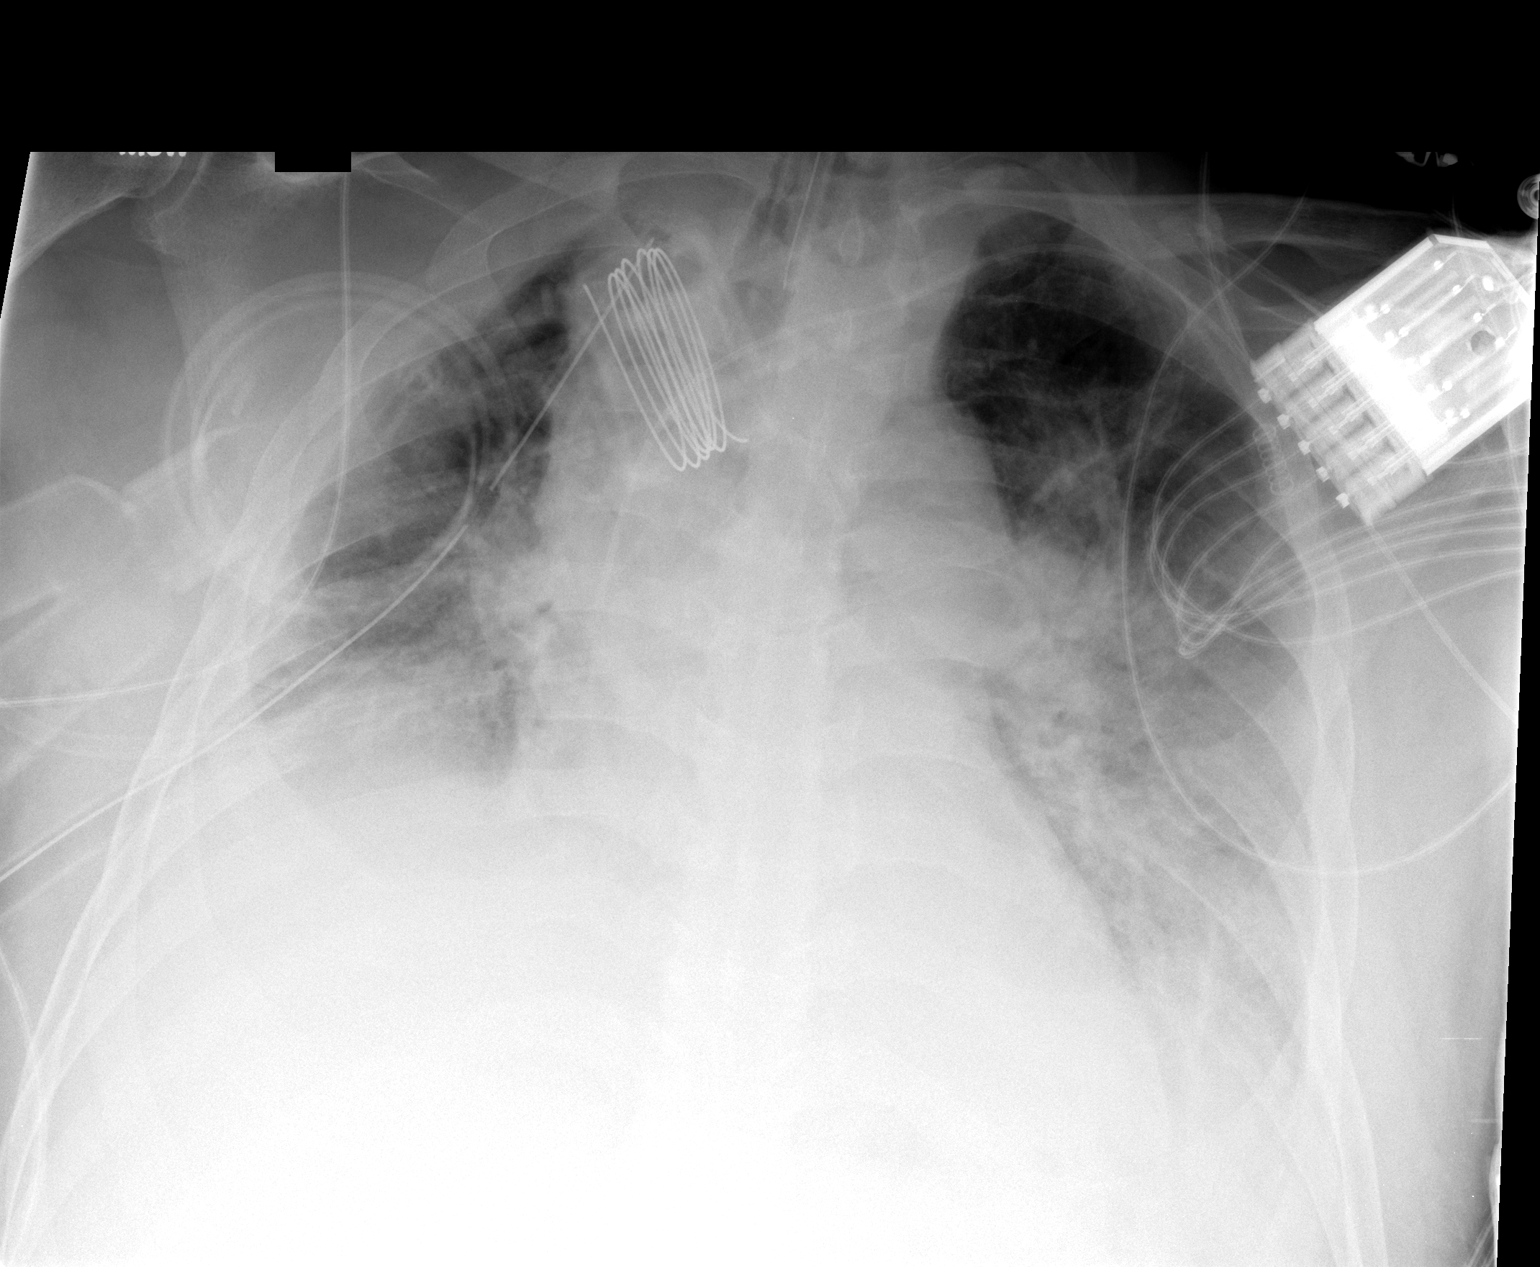

[1 of 1 positions shown; findings below may reference images not displayed]

FINDINGS: Right pleural chest tube remains in position.  No pneumothorax.  Satisfactory ETT position.  PICC line tip is in the distal superior vena cava region.  Bilateral air space opacities are again noted.  There is some pleural density on the right as well.  Atelectatic changes at the bases.
IMPRESSION: No pneumothorax.  Bilateral air space disease.

## 2008-10-02 IMAGING — CR DG CHEST 1V PORT
1 series · 1 of 1 positions shown · non-contrast
Comparison: Comparison is made with earlier exam today at 9139 hours.

CLINICAL DATA: Status-post line placement.
PORTABLE CHEST - 1 VIEW ? 05/13/06 AT 5558 HOURS:

[view not recorded]
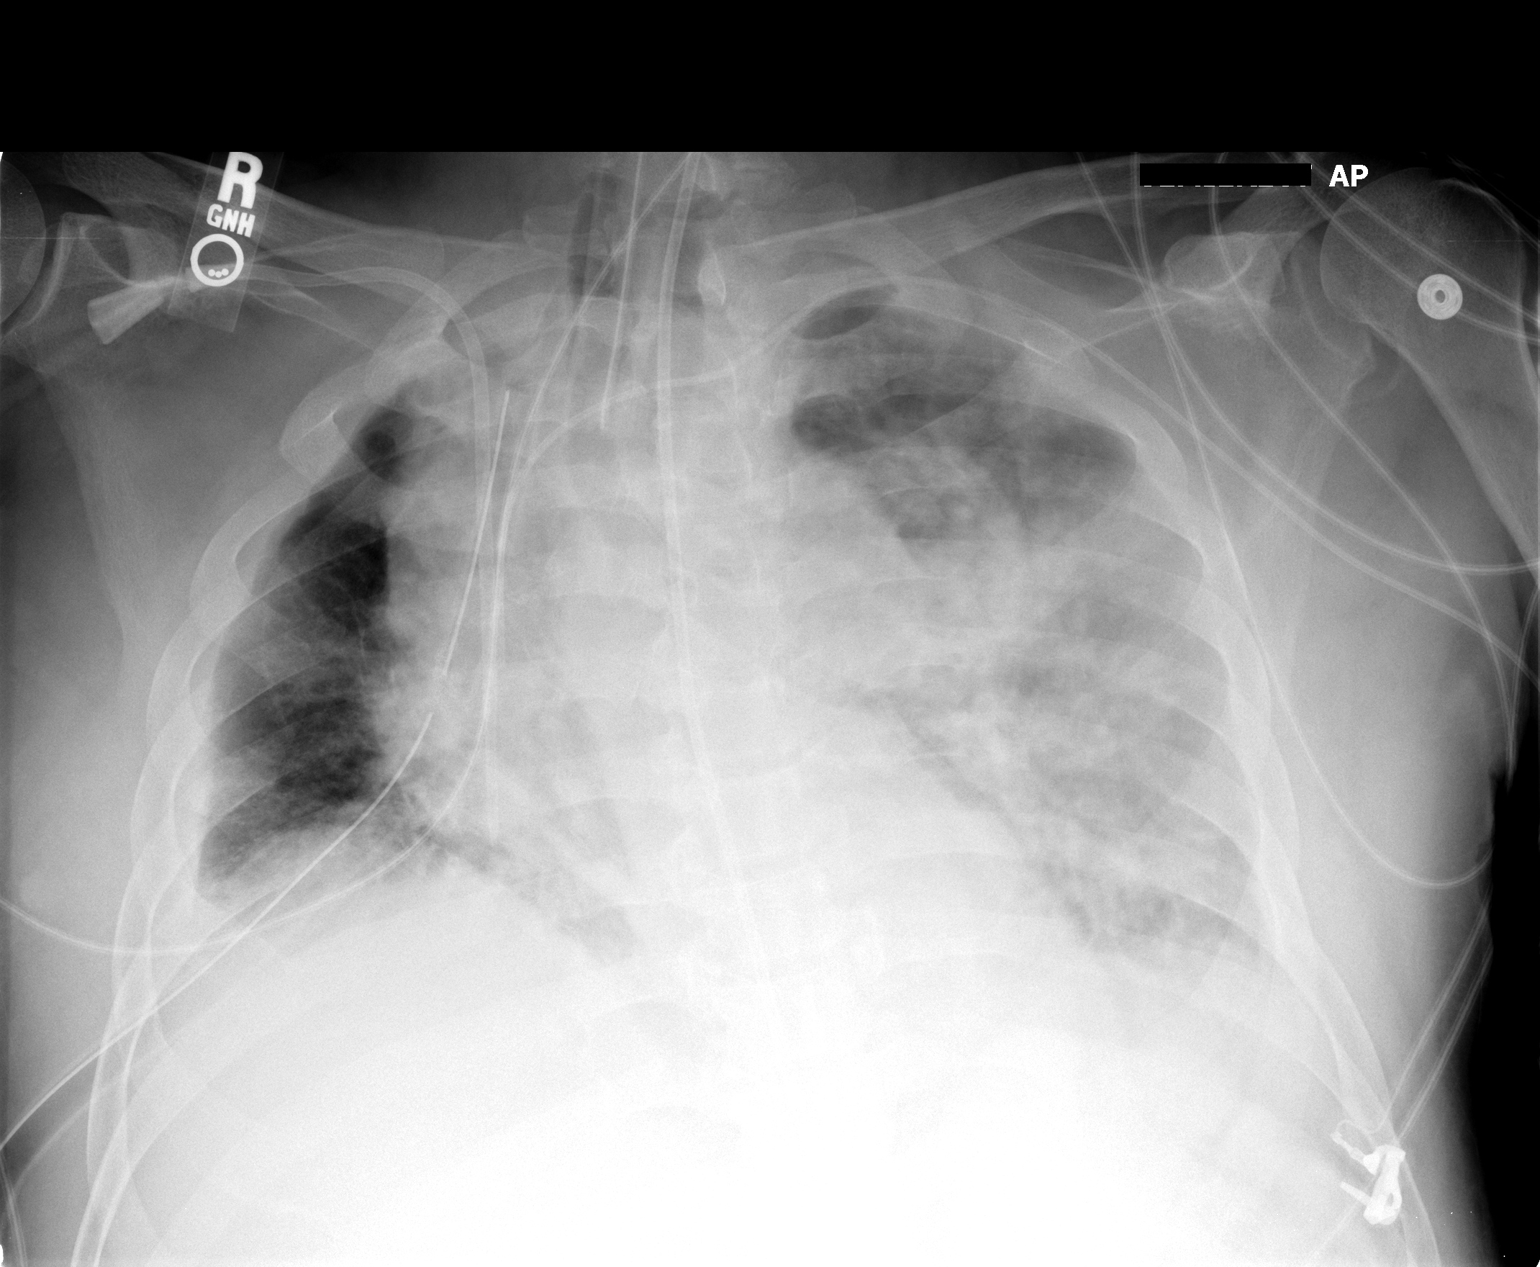

[1 of 1 positions shown; findings below may reference images not displayed]

FINDINGS: Status-post placement of CVC via a right-sided approach. The tip of the catheter appears to be near the expected position of the SVC--right atrial junction or just within the right atrium.  There is a small right pneumothorax, estimated to be at approximately 10% in degree. In retrospect, there may have been some intrapleural air in the right upper hemithorax on the earlier exam today. Right pleural chest tube in position.  ETT in satisfactory position.  Panda feeding tube is traversing the esophagus. Left PICC line tip is in the SVC. Airspace disease mainly involving the left lung again noted. Pulmonary vascular congestion.   Atelectatic changes at the right base. Poor visualization of the right mainstem bronchus.
IMPRESSION: Newly placed CVC with tip near the SVC right atrial junction.  Small right pneumothorax.

## 2008-10-03 IMAGING — CR DG CHEST 1V PORT
1 series · 1 of 1 positions shown · non-contrast
Comparison: 05/13/06 and CT chest 05/01/06 and 04/19/06.

CLINICAL DATA: Chest tube, lung mass.  
PORTABLE CHEST - 1 VIEW 05/14/06:

[view not recorded]
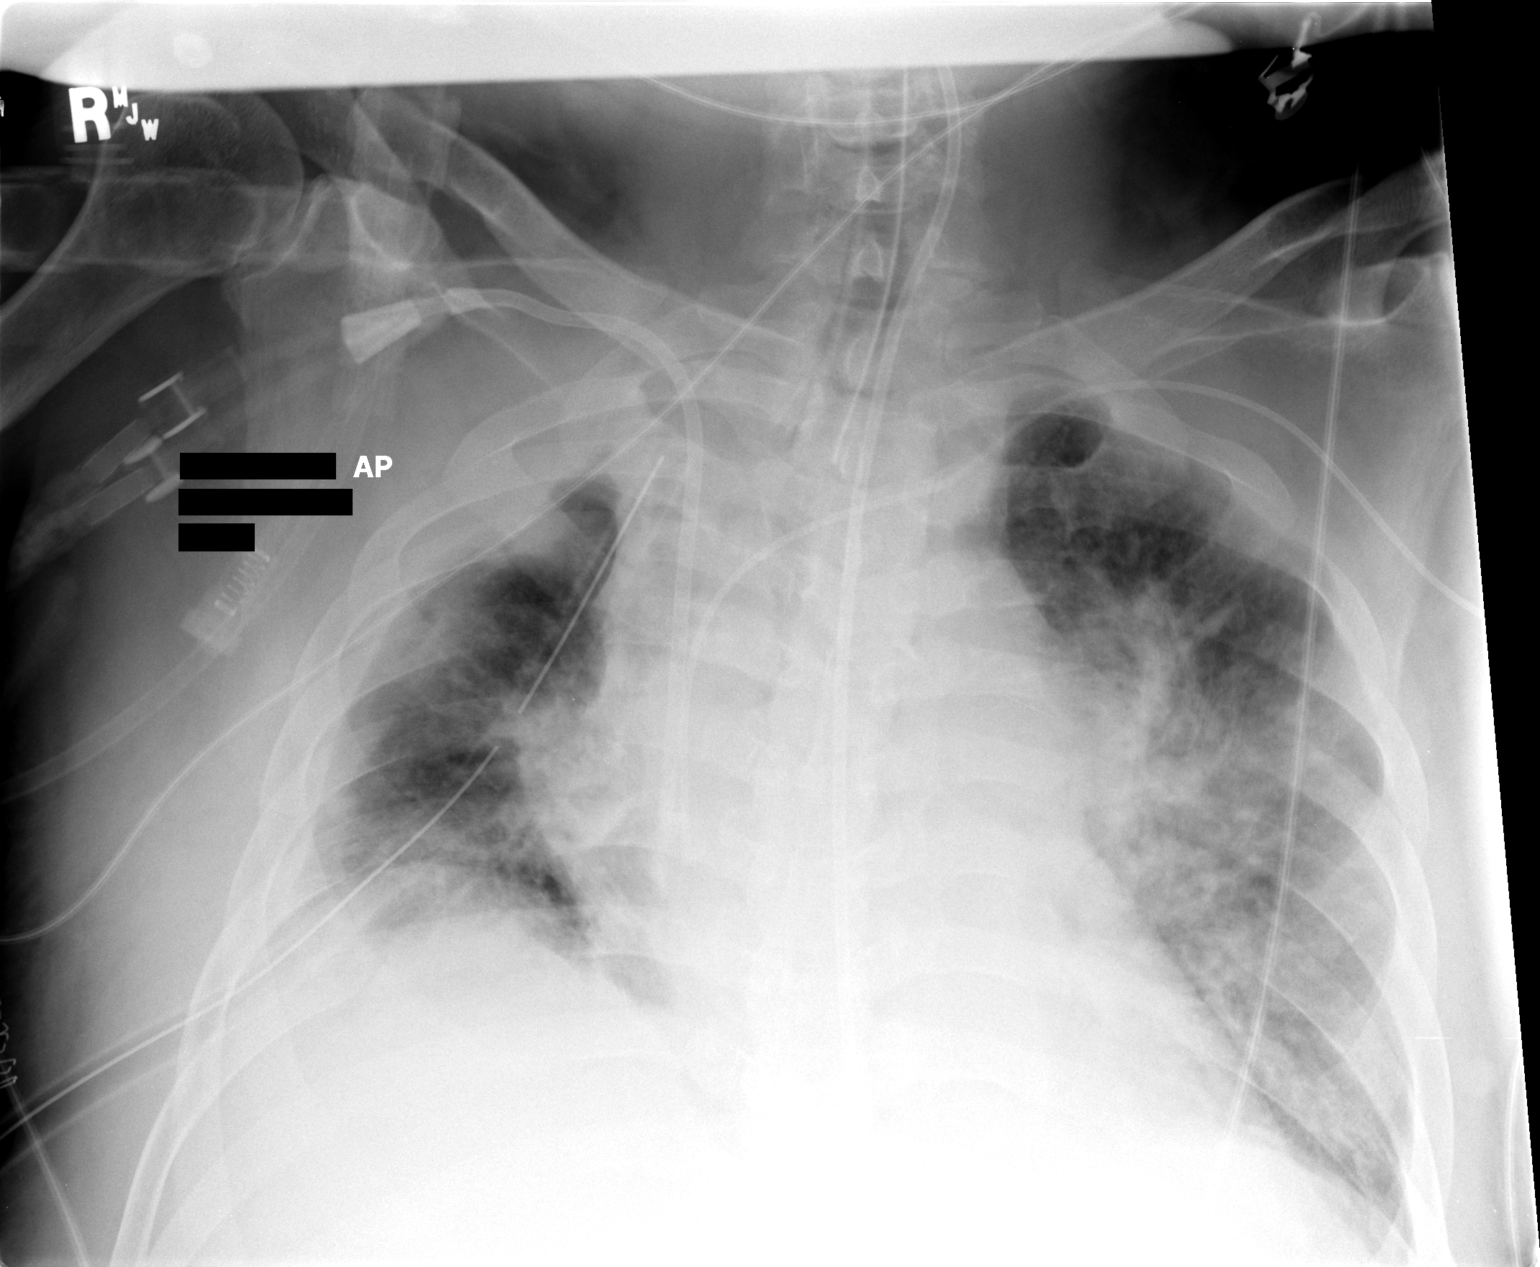

[1 of 1 positions shown; findings below may reference images not displayed]

FINDINGS: Support apparatus stable.  No definite pneumothorax.  Perihilar densities persist.  Extremely low lung volumes.
IMPRESSION: 1.  No definite pneumothorax. 
2.  Persistent perihilar opacities and extremely low lung volumes.

## 2008-10-05 IMAGING — CR DG CHEST 1V PORT
1 series · 1 of 1 positions shown · non-contrast
Comparison: 05/15/06.

CLINICAL DATA: 60-year-old male with lung mass status post right thoracotomy.
PORTABLE CHEST - 1 VIEW, 05/16/06:

[view not recorded]
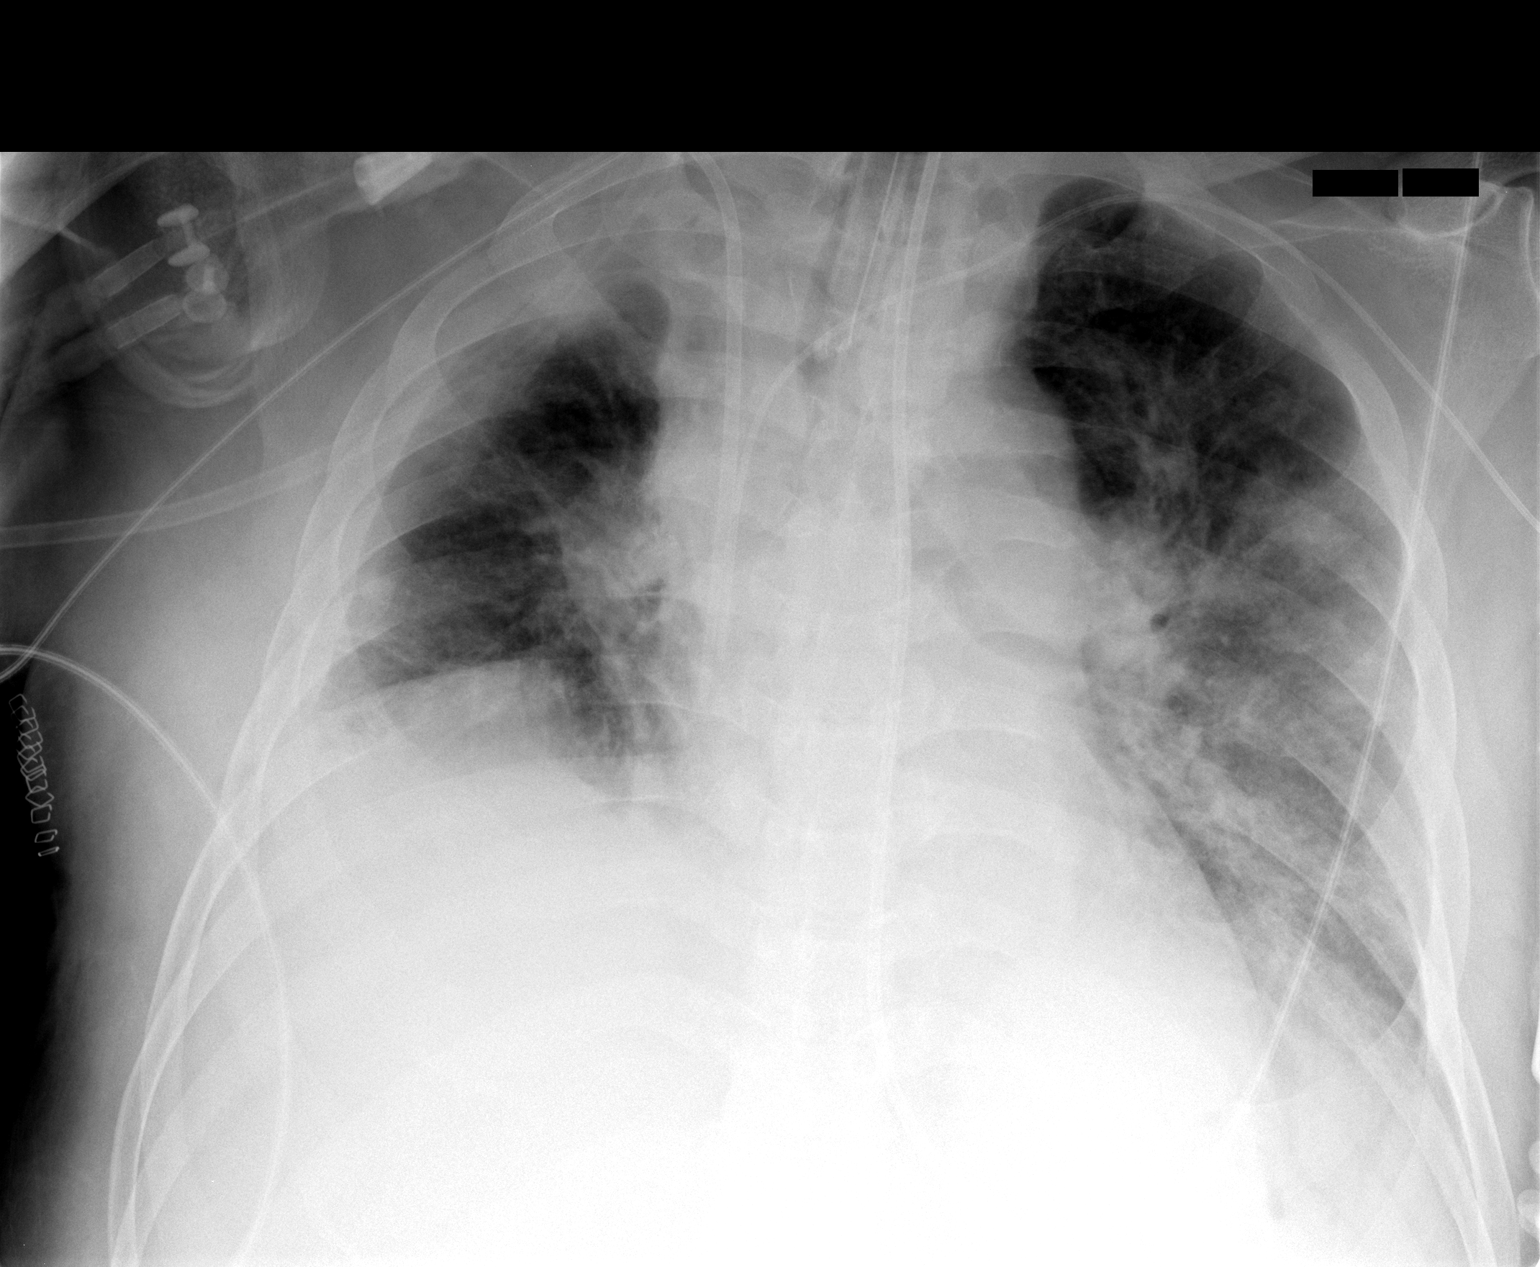

[1 of 1 positions shown; findings below may reference images not displayed]

FINDINGS: Endotracheal tube remains 3.6 cm above the carina.  Right subclavian dialysis catheter and left PICC line tips remain in the SVC.  The feeding tube enters the stomach.  Right chest tube has been removed.  Lung volumes rema[REDACTED]reased with a stable pattern of central vascular congestion and pulmonary edema.  Bibasilar atelectasis remains.  No change in residual right pleural effusion.  No pneumothorax.
IMPRESSION: 1.  Stable pulmonary edema, bibasilar atelectasis, and right effusion.
2.  Removal of right chest tube.  No pneumothorax.

## 2008-10-06 ENCOUNTER — Ambulatory Visit: Payer: Self-pay | Admitting: Internal Medicine

## 2008-10-06 LAB — CONVERTED CEMR LAB
CO2: 21 meq/L (ref 19–32)
Chloride: 108 meq/L (ref 96–112)
Creatinine, Ser: 1.43 mg/dL (ref 0.40–1.50)
Potassium: 4.1 meq/L (ref 3.5–5.3)

## 2008-10-06 IMAGING — CR DG CHEST 1V PORT
1 series · 1 of 1 positions shown · non-contrast
Comparison: [DATE].

CLINICAL DATA: Lung mass.   Postop. 
 POTABLE CHEST ? 1 VIEW, 05/17/06, 4240 HOURS:

[view not recorded]
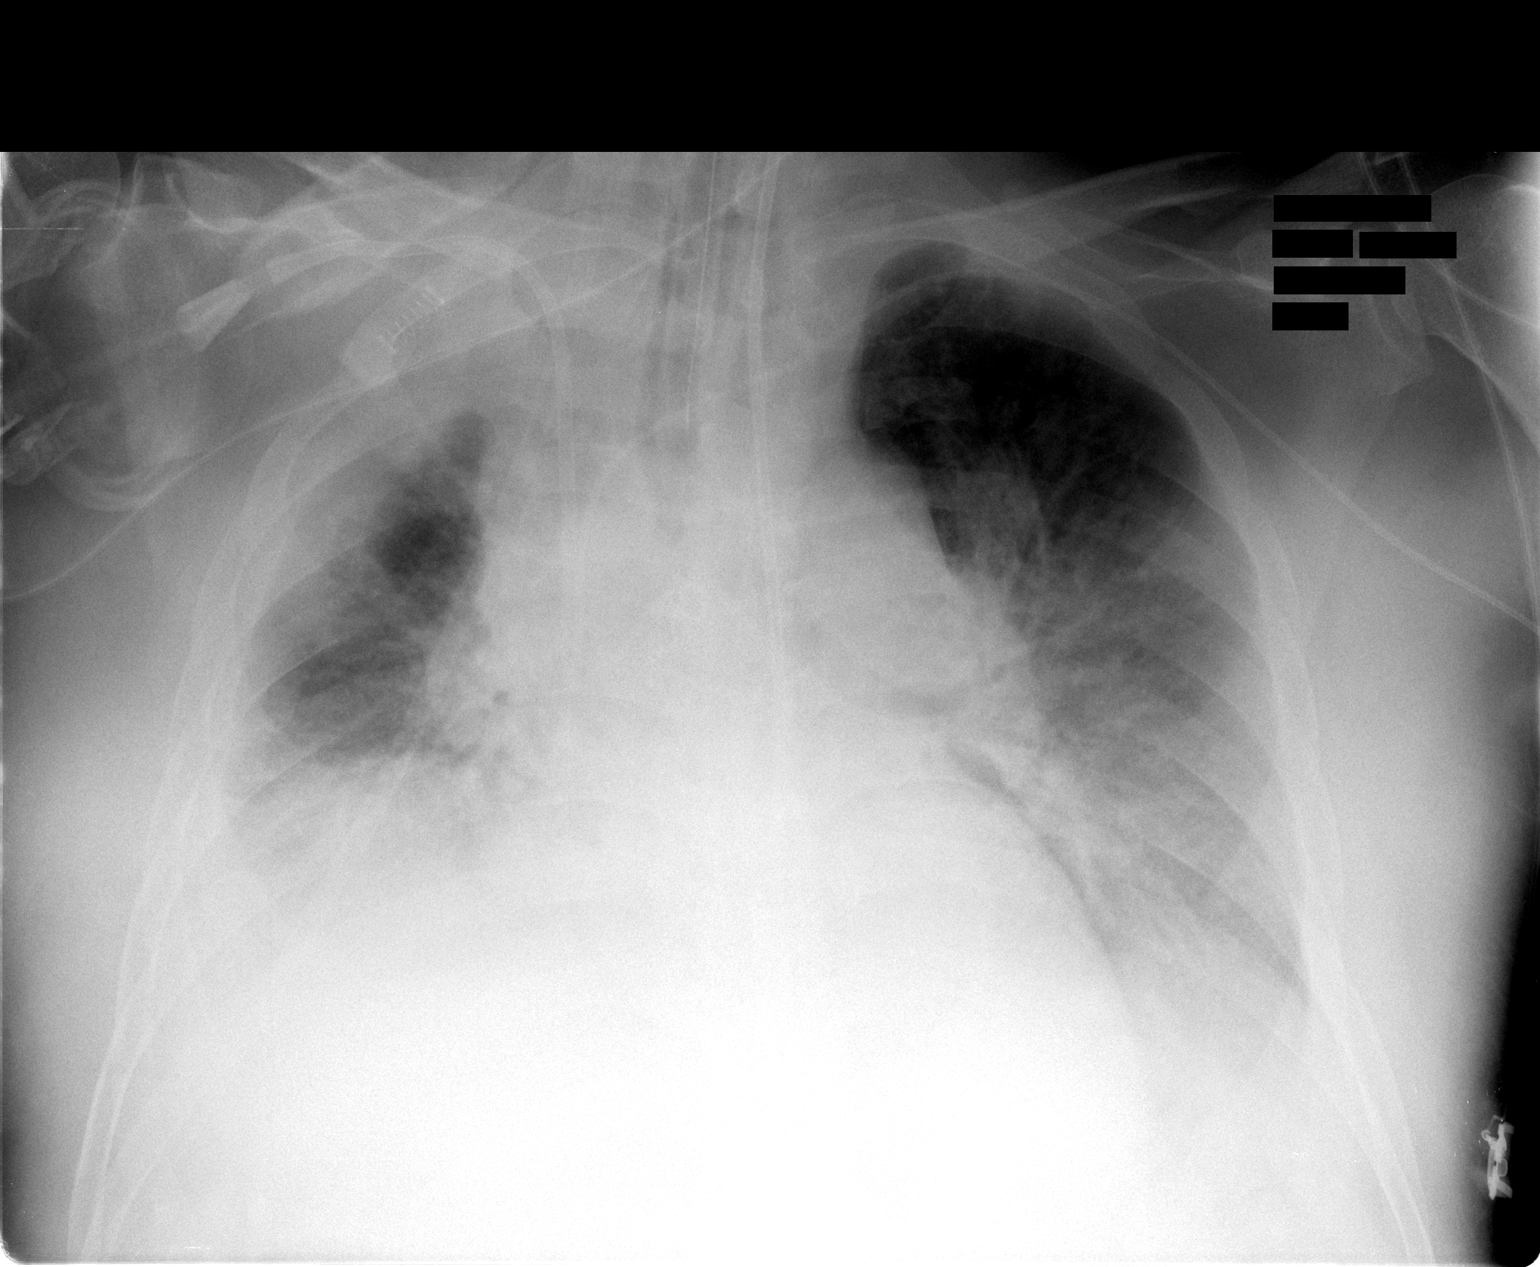

[1 of 1 positions shown; findings below may reference images not displayed]

FINDINGS: Endotracheal tube is unchanged in position, with left PICC line and right central venous catheter unchanged.  Patchy air space disease remains bilaterally.  Again, the lungs are not well aerated.
IMPRESSION: Suboptimal aeration with little change in patchy air space disease.

## 2008-10-07 IMAGING — CR DG CHEST 1V PORT
1 series · 1 of 1 positions shown · non-contrast
Comparison: 05/17/06.

CLINICAL DATA: Respiratory failure.  Status post resection of right lung mass.
 PORTABLE CHEST - 1 VIEW - 05/18/06 AT 3123 HOURS:

[view not recorded]
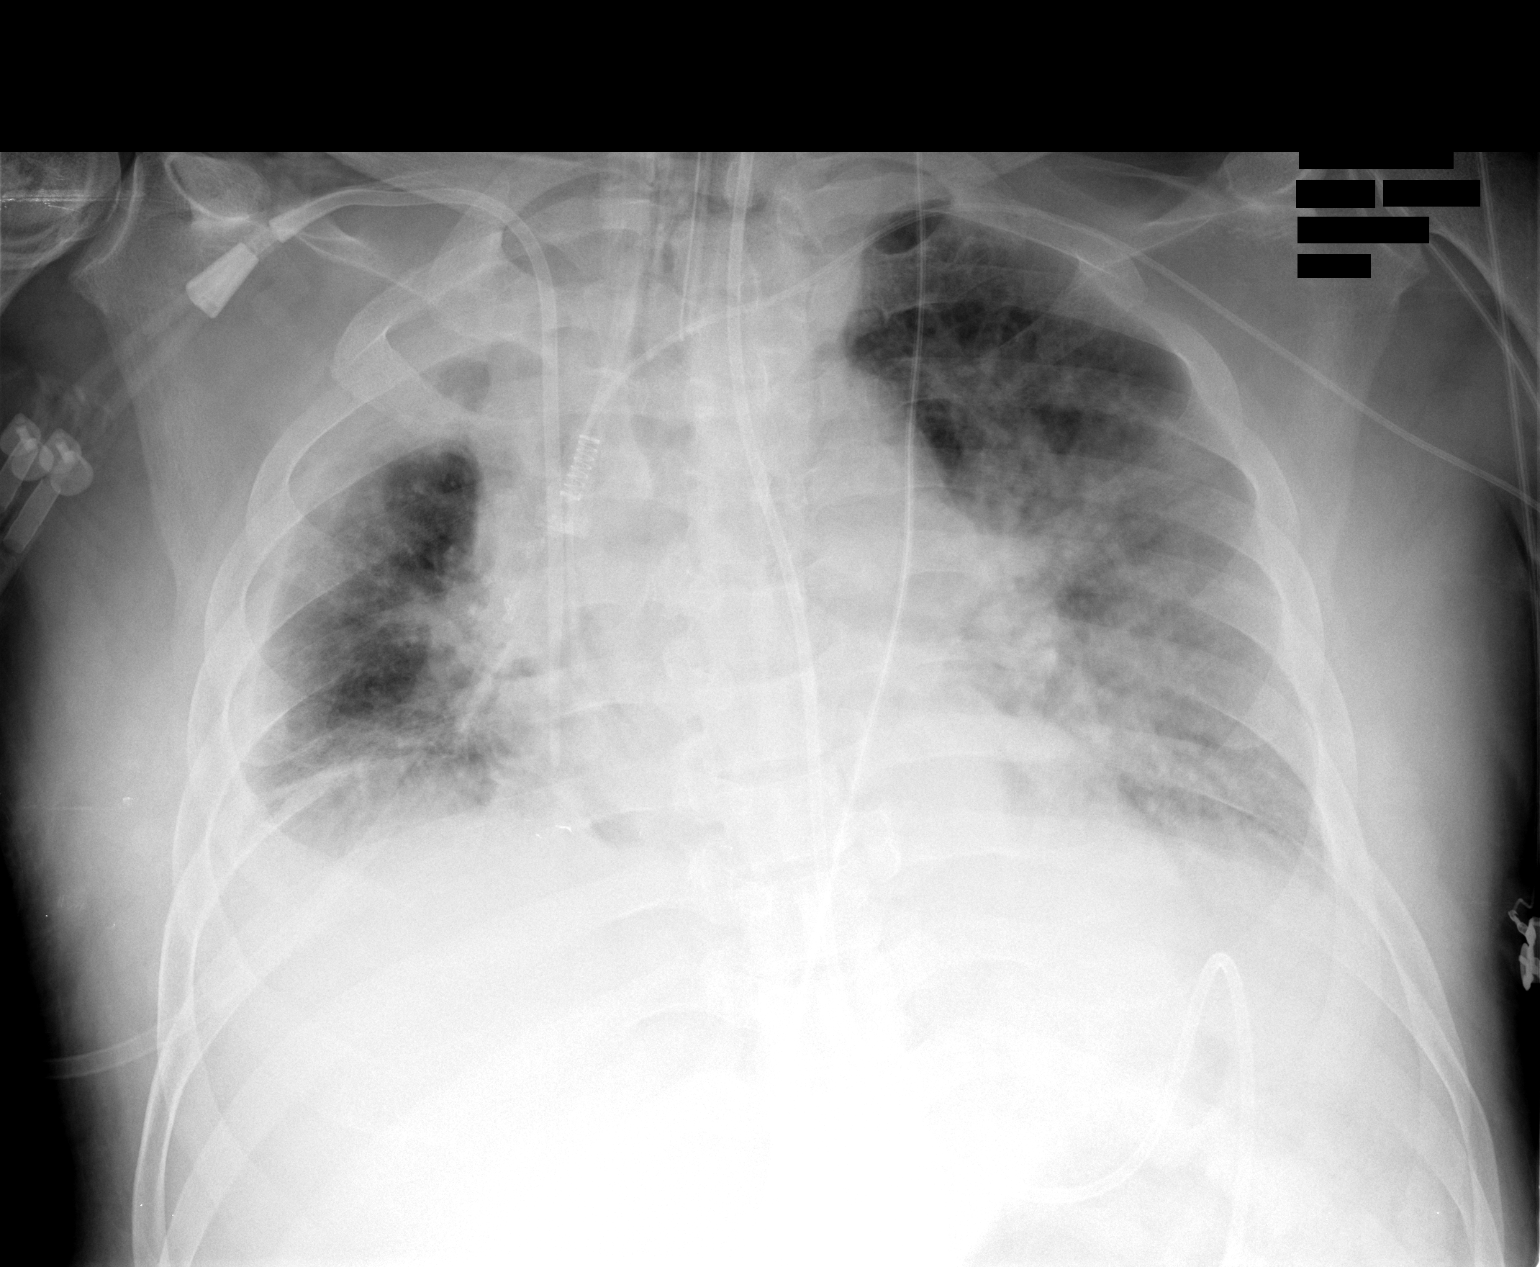

[1 of 1 positions shown; findings below may reference images not displayed]

FINDINGS: There is very little change in lung volumes.  The edema pattern may be slightly worse.  There is stable positioning of an endotracheal tube and a dialysis catheter.
IMPRESSION: Slight interval worsening of air space edema.

## 2008-10-07 IMAGING — CR DG ABD PORTABLE 1V
1 series · 1 of 1 positions shown · non-contrast
Comparison: none

CLINICAL DATA: Evaluate panda position.  
 PORTABLE ABDOMEN ? 1 VIEW:

[view not recorded]
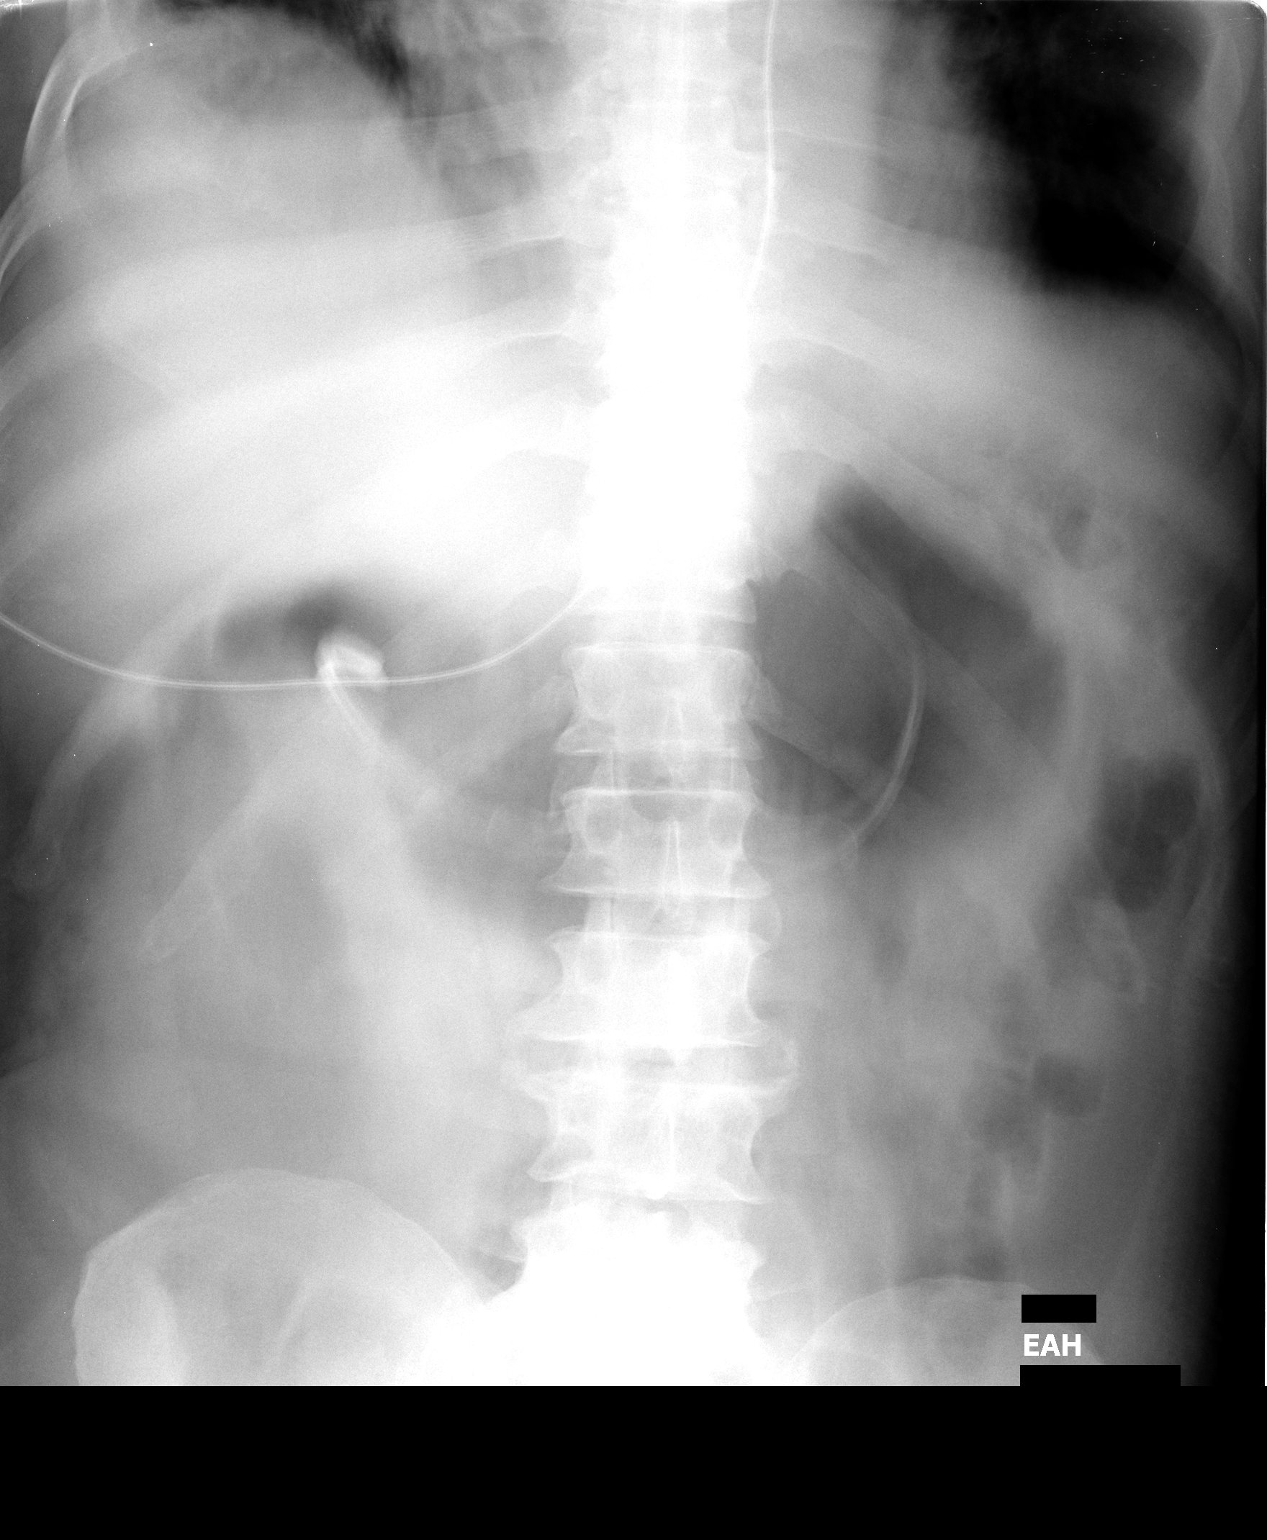

[1 of 1 positions shown; findings below may reference images not displayed]

FINDINGS: Feeding tube is within the proximal duodenum.  Nonobstructive bowel gas.
IMPRESSION: Feeding tube within the proximal duodenum.

## 2008-10-08 IMAGING — CR DG CHEST 1V PORT
1 series · 1 of 1 positions shown · non-contrast
Comparison: 05/18/06.

CLINICAL DATA: 60-year-old lung mass.  VATS.
 PORTABLE CHEST:

[view not recorded]
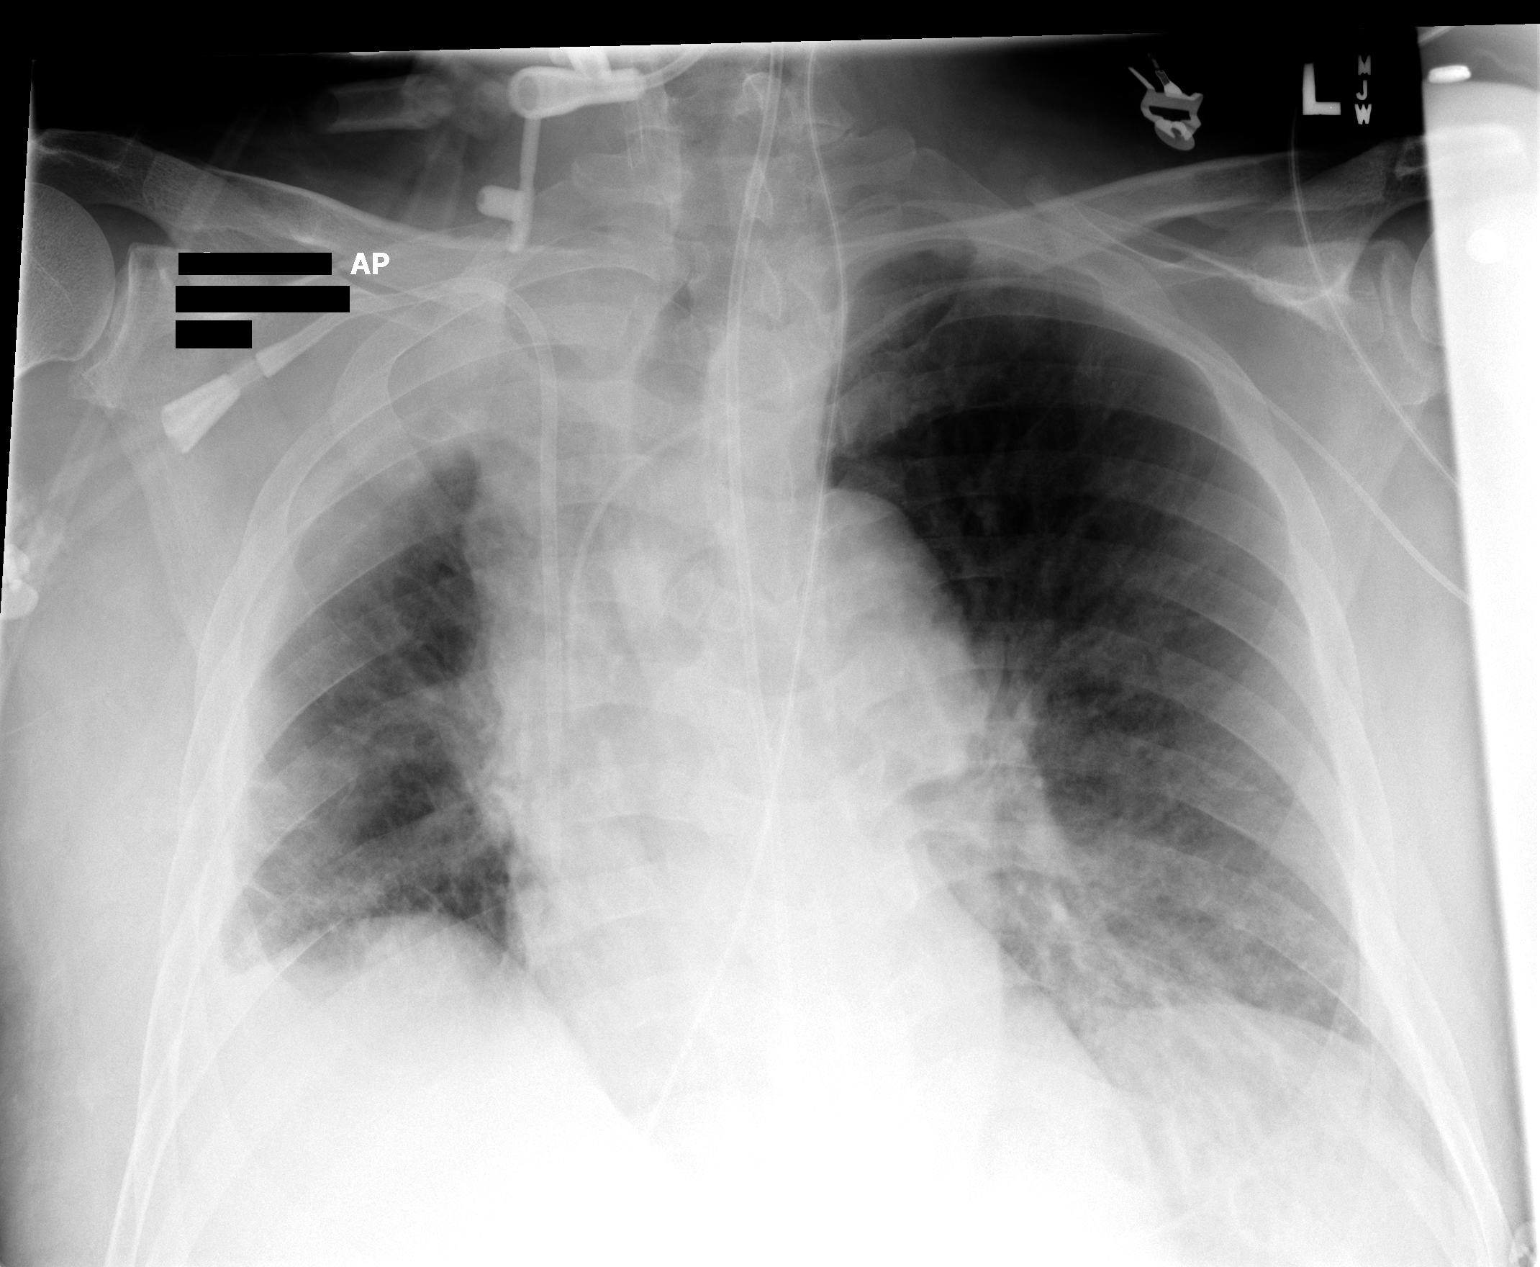

[1 of 1 positions shown; findings below may reference images not displayed]

FINDINGS: The endotracheal tube has been removed.  The remaining support apparatus is stable.  Improved lung aeration bilaterally with less edema and atelectasis.  Stable loss of volume on the right side with pleural effusion and/or diffuse pleural thickening.  No definite pneumothorax is seen.
IMPRESSION: 1.  Removal of endotracheal tube.  
 2.  Improved lung aeration with less edema and atelectasis.

## 2008-10-09 IMAGING — CR DG CHEST 1V PORT
1 series · 1 of 1 positions shown · non-contrast
Comparison: 05/19/06.

CLINICAL DATA: Lung mass and chest tube. 
 PORTABLE CHEST:

[view not recorded]
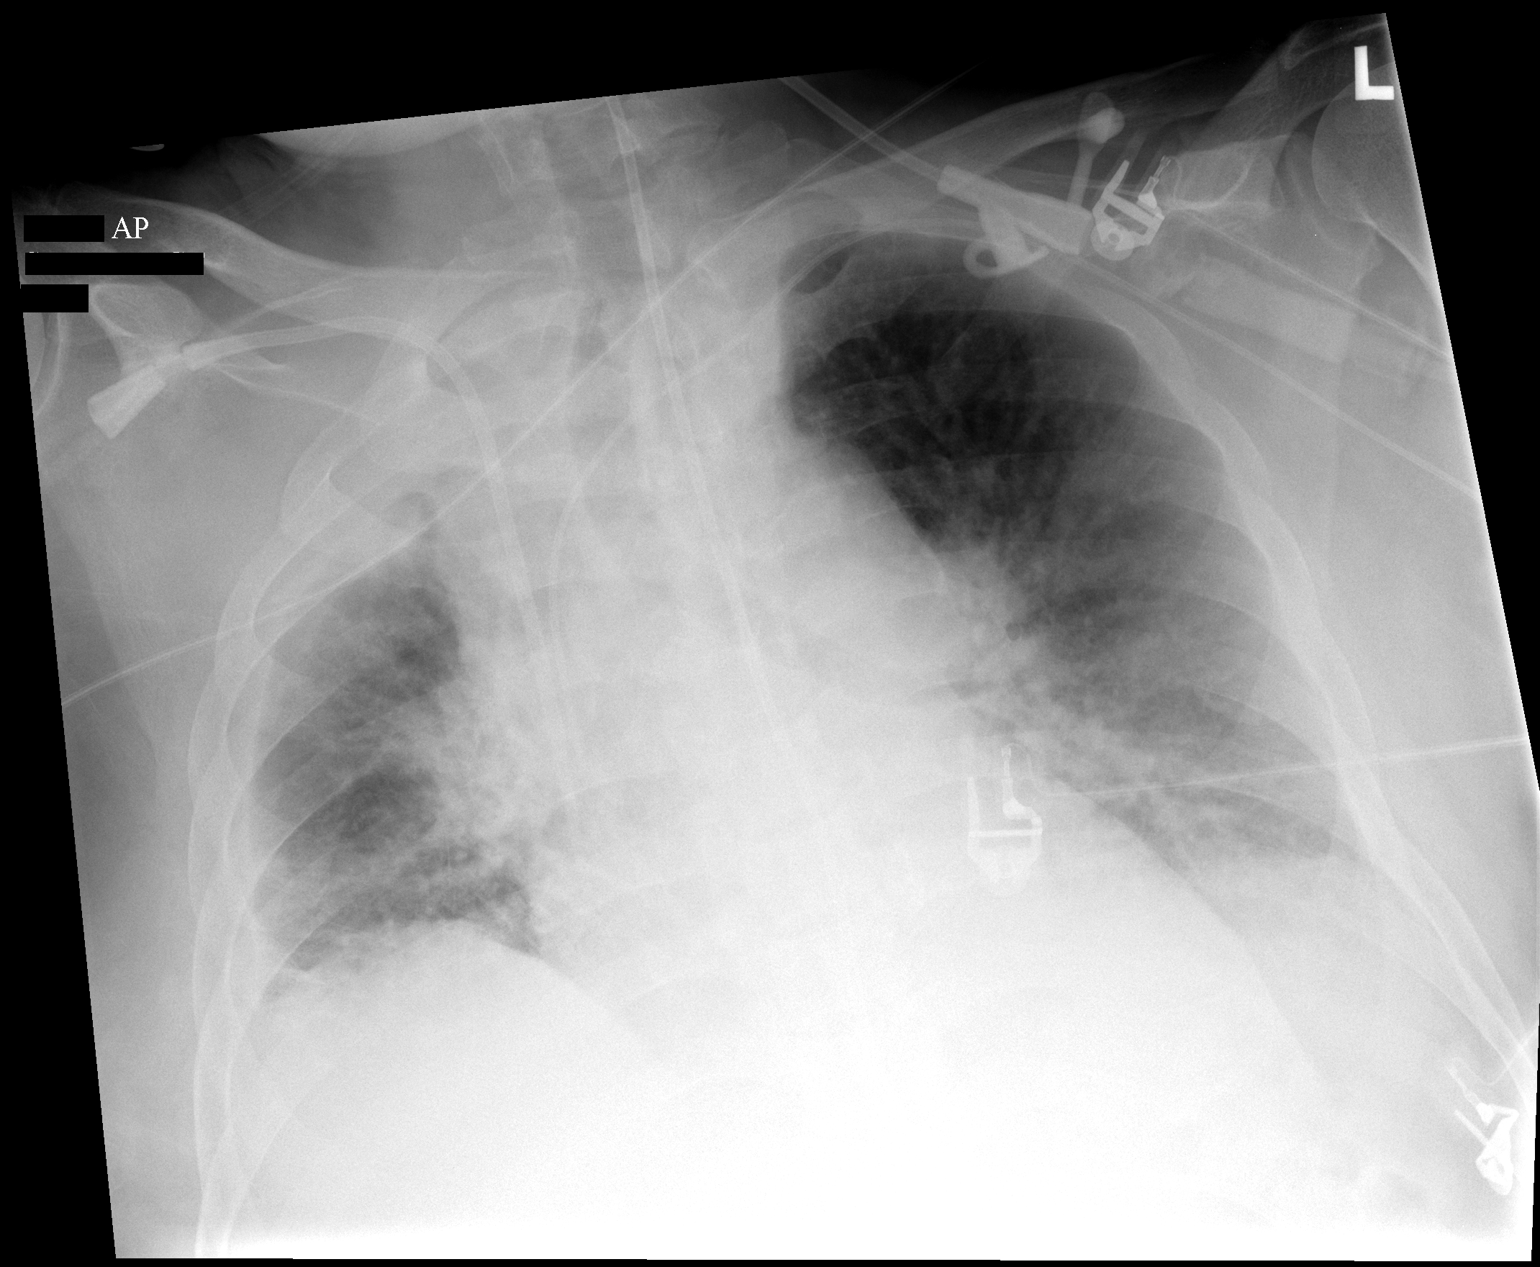

[1 of 1 positions shown; findings below may reference images not displayed]

FINDINGS: Left-sided PICC line and right subclavian catheters unchanged in position.  Feeding tube extends beyond the inferior aspect of the film.  Patient is rotated to the right.  The trachea is midline.  The heart is mildly enlarged.  There is prominence of the superior mediastinum which is likely technique related.  Decreased lung volumes with increased bibasilar atelectasis.  Persistent right apical opacity.  Right-sided pleural fluid and thickening.  Mild pulmonary venous congestion is similar.  No pneumothorax.
IMPRESSION: 1.  Slight worsening in bibasilar atelectasis with similar pulmonary venous congestion.
 2.  Right apical airspace disease is similar with small right pleural effusion and thickening.

## 2008-10-11 IMAGING — CR DG CHEST 1V PORT
1 series · 1 of 1 positions shown · non-contrast
Comparison: 05/20/06.

CLINICAL DATA: Lung mass.  Respiratory distress. 
PORTABLE CHEST - 1 VIEW ? 05/22/06 (8235 HOURS):

[view not recorded]
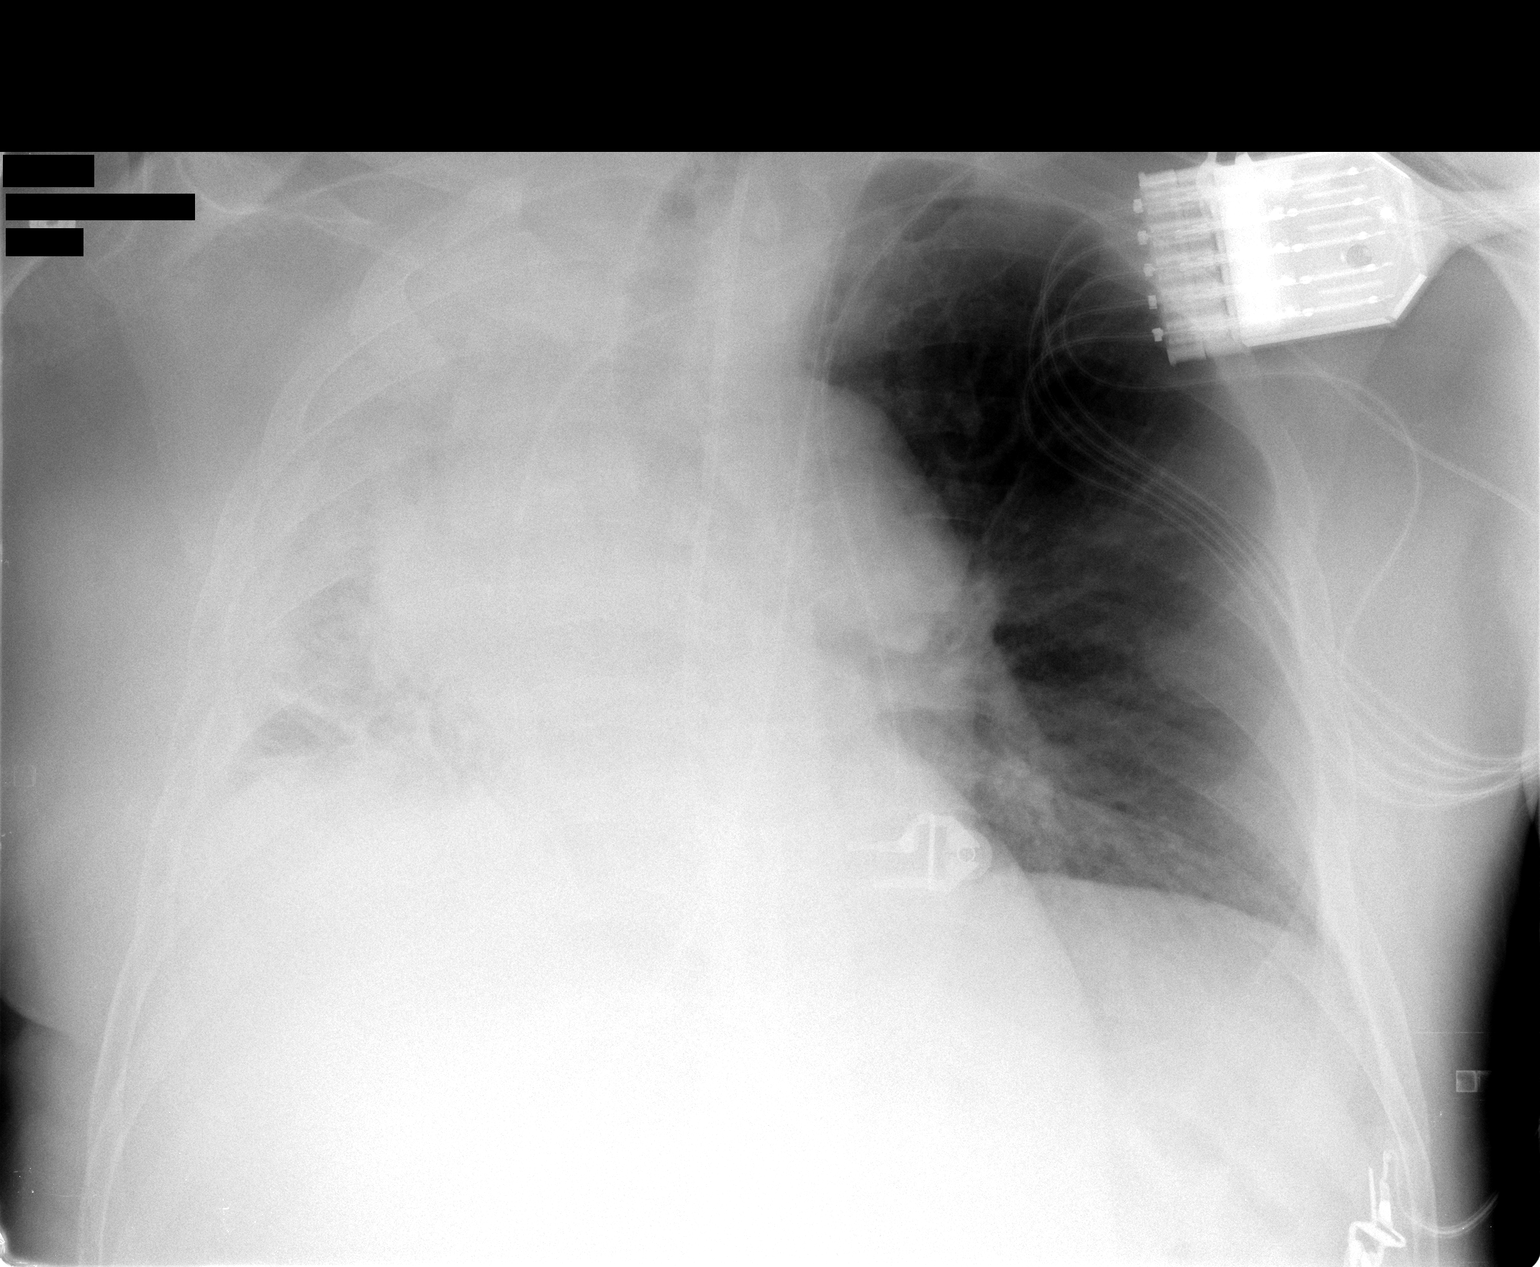

[1 of 1 positions shown; findings below may reference images not displayed]

FINDINGS: There is progressive volume loss and opacity in the right hemithorax with mediastinal shift to the right.   The underlying pleural disease appears grossly stable.  Left basilar aeration has improved.  The left arm PICC and the feeding tube appear in stable position.  The visualized mediastinal contours are unchanged.
IMPRESSION: Progressive opacity and volume loss in the right hemithorax, suggesting a degree of lung collapse, possibly due to mucous plugging.  Superimposed pneumonia is not excluded.

## 2008-10-12 IMAGING — CR DG CHEST 1V PORT
1 series · 1 of 1 positions shown · non-contrast
Comparison: none

CLINICAL DATA: 60-year-old, lung mass.  Right lung collapse.   Endotracheal tube placement.  
 PORTABLE CHEST - 1 VIEW:

[view not recorded]
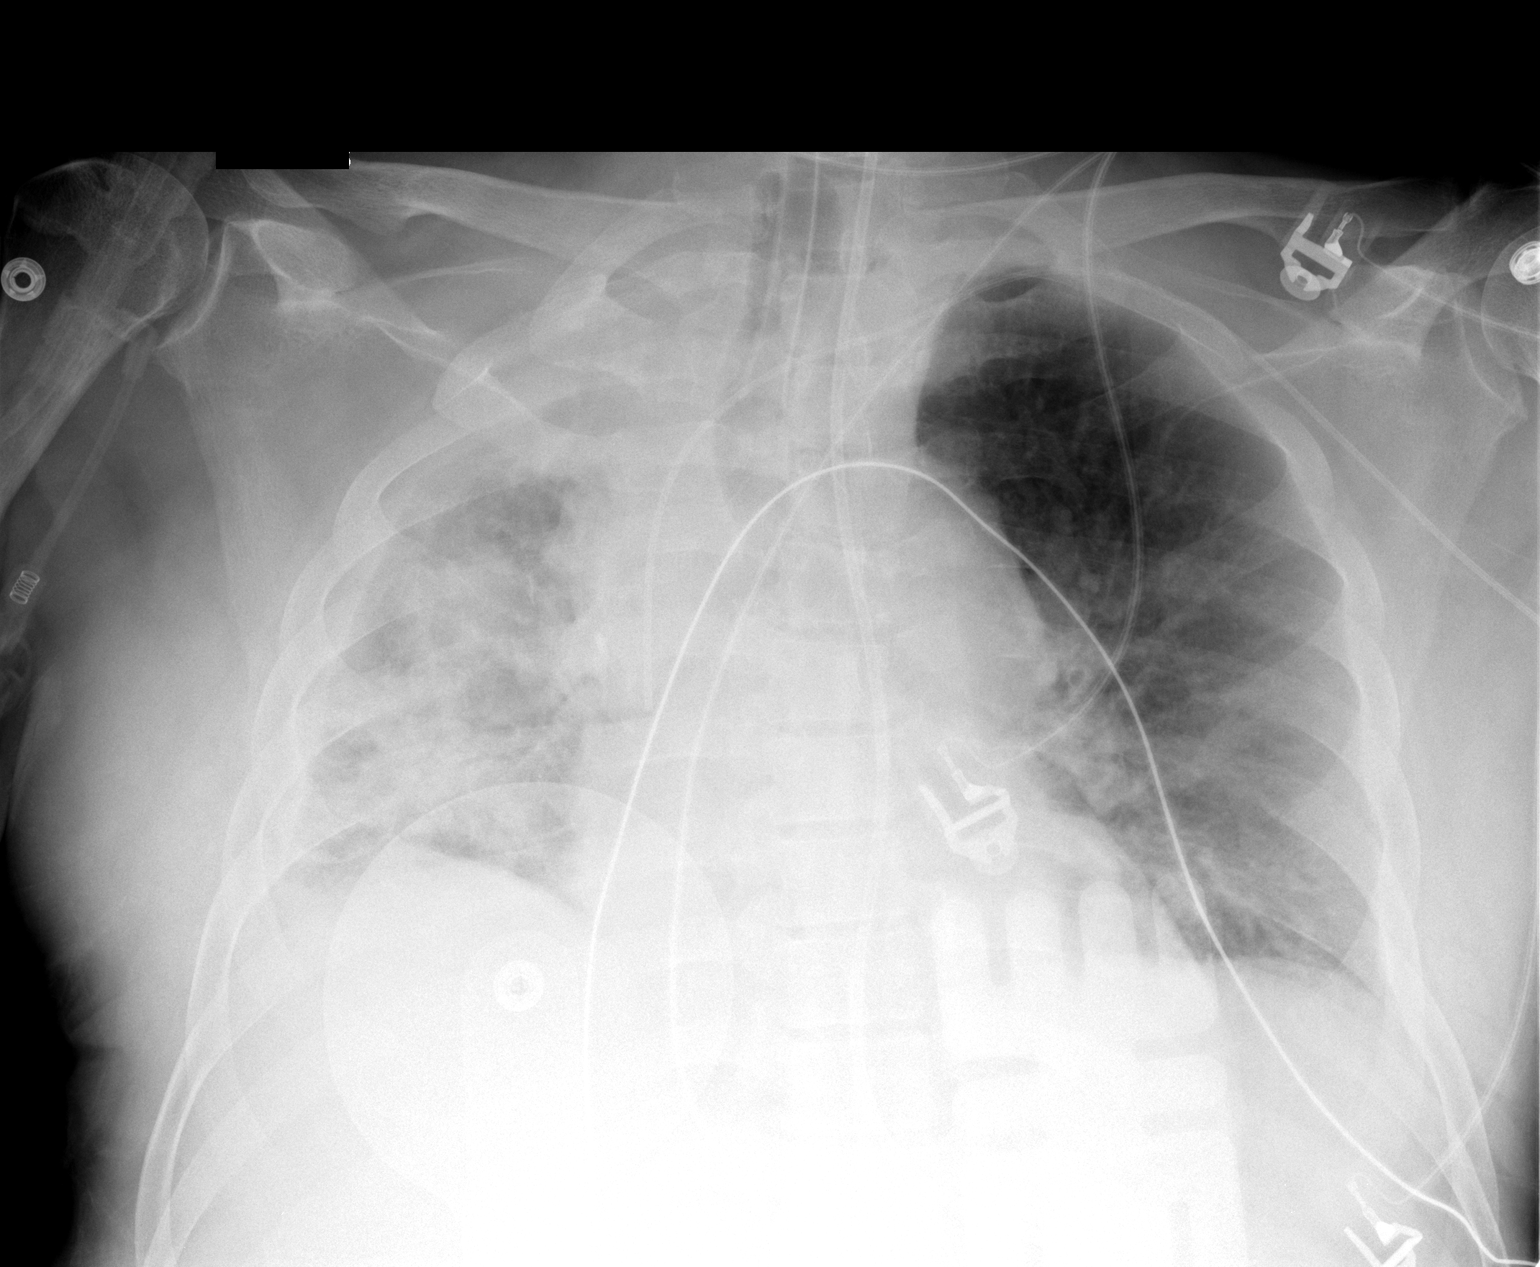

[1 of 1 positions shown; findings below may reference images not displayed]

FINDINGS: Endotracheal tube is in good position, 4.8 cm above the carina.  Right main stem bronchus appears cut off, but there is now slight improved right lung aeration.  Left lung is stable.  There is a Panda tube coursing down into the stomach.  
 Left subclavian central venous catheter is stable.
IMPRESSION: 1.  Endotracheal tube in good position 4.8 cm above the carina. 
 2.  Right main stem bronchus cut off sign with slight improved right lung aeration.

## 2008-10-12 IMAGING — CR DG CHEST 1V PORT
1 series · 1 of 1 positions shown · non-contrast
Comparison: 05/22/06.

CLINICAL DATA: Lung mass.   Right lung collapse. 
 PORTABLE CHEST ? 1 VIEW ? 05/23/06 AT 2357 HOURS:

[view not recorded]
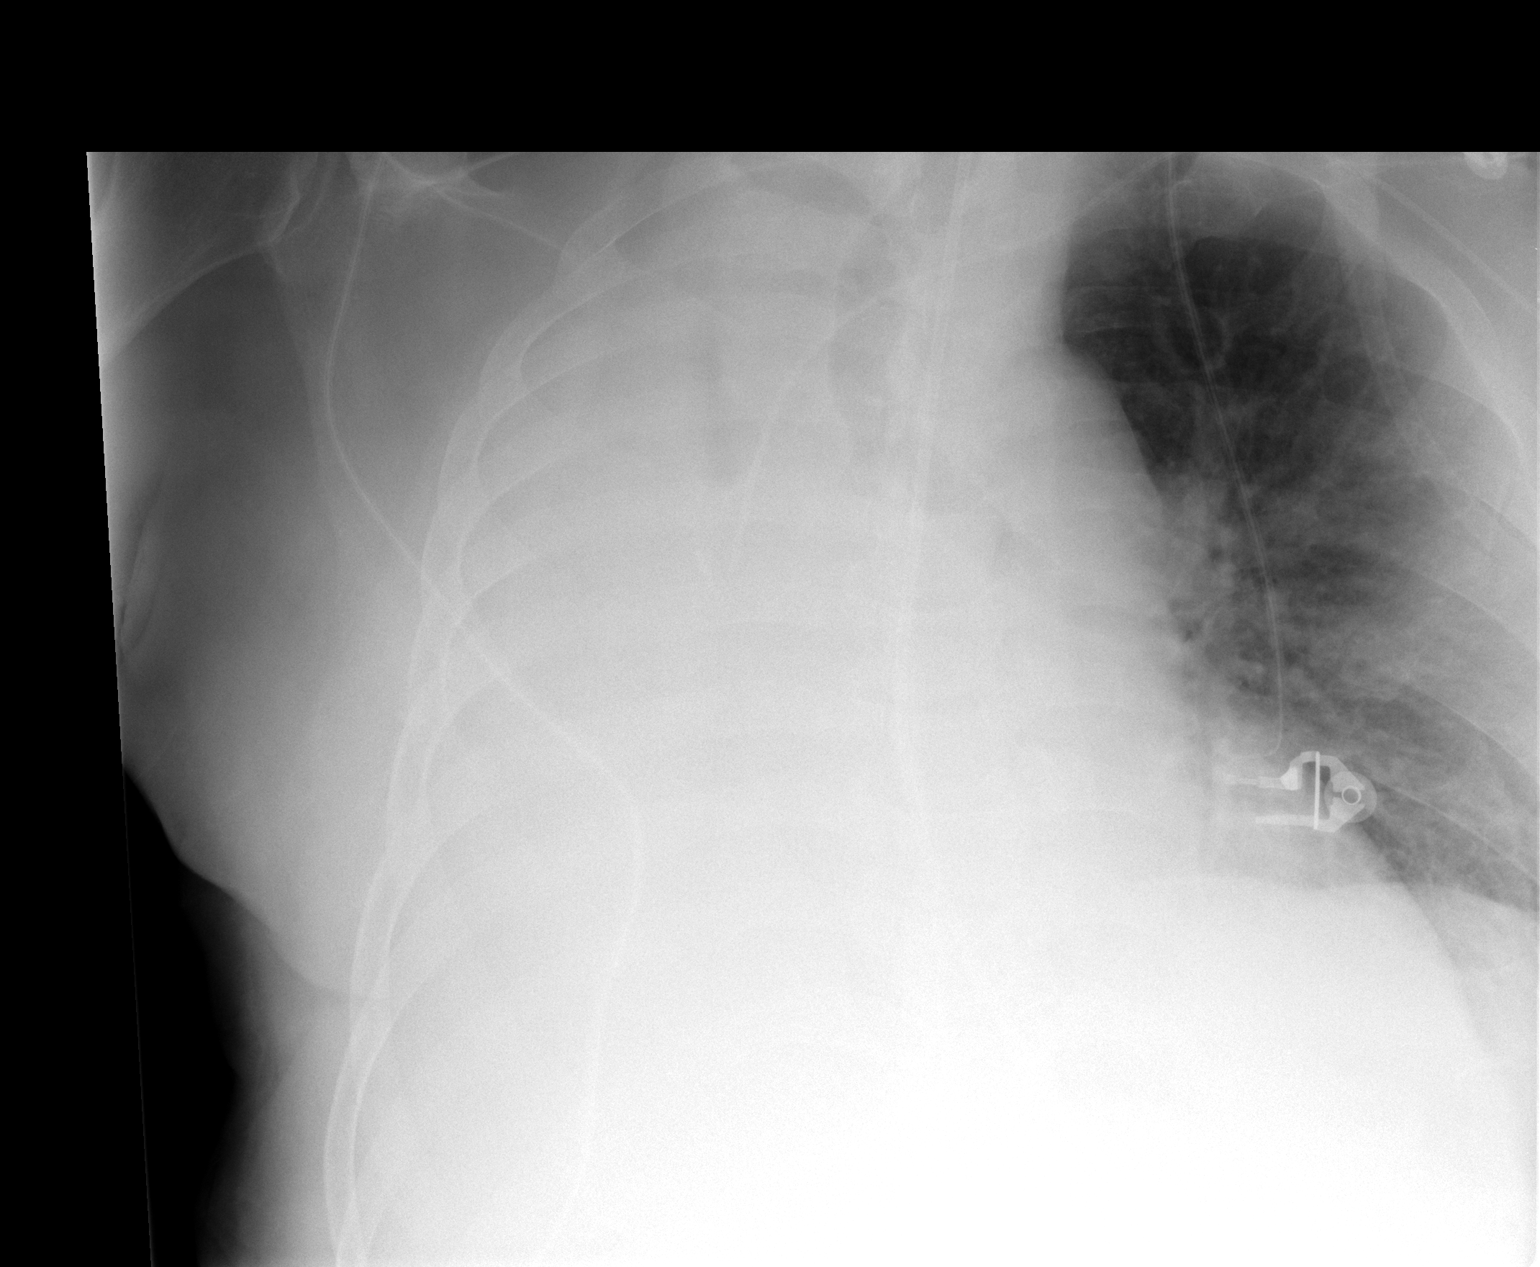

[1 of 1 positions shown; findings below may reference images not displayed]

FINDINGS: Progressive collapse of right lung is present, now with essentially complete collapse of the right lung.  There is mild vascular congestion of the left lung without significant infiltrate.  Central line tip is in the SVC.
IMPRESSION: Progression of collapse of the right lung with essentially complete collapse of the right lung.  Mild vascular congestion on the left.

## 2008-10-13 IMAGING — CT CT CHEST W/O CM
2 of 3 series · 15 of 36 positions shown, 18 images · IV contrast (agent unspecified)
Comparison: 04/19/06.

CLINICAL DATA: 60-year-old with lung mass status post thoracotomy.  Dropping hemoglobin.
 CHEST CT WITHOUT CONTRAST:
TECHNIQUE: Multidetector CT imaging of the chest was performed following the standard protocol without IV contrast.

[Series 2: routine chest · axial · 0.74mm/px · z∈[-350,-65]mm · 12 of 67 slices shown, 15 images]
[im 5/67  mediastinal]
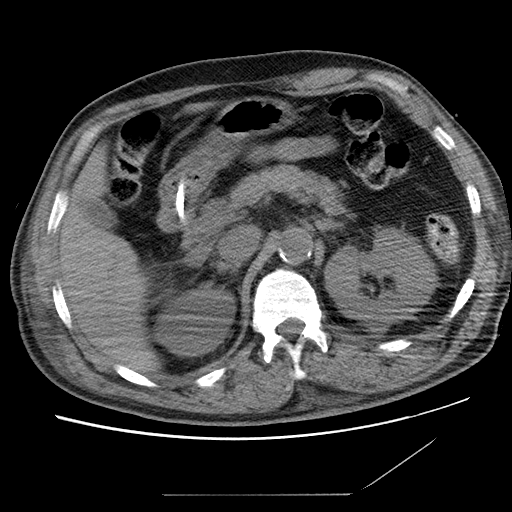
[im 5/67  lung]
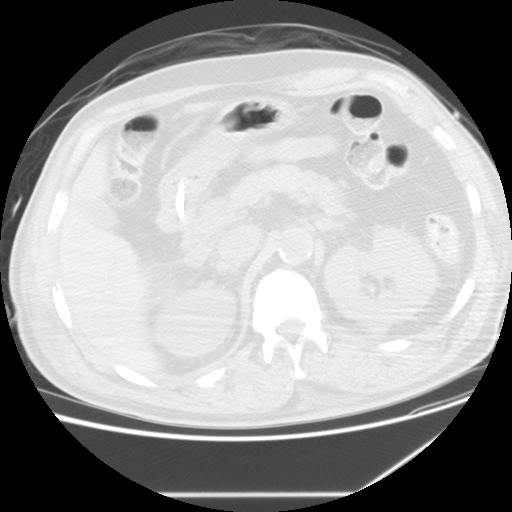
[im 10/67  lung]
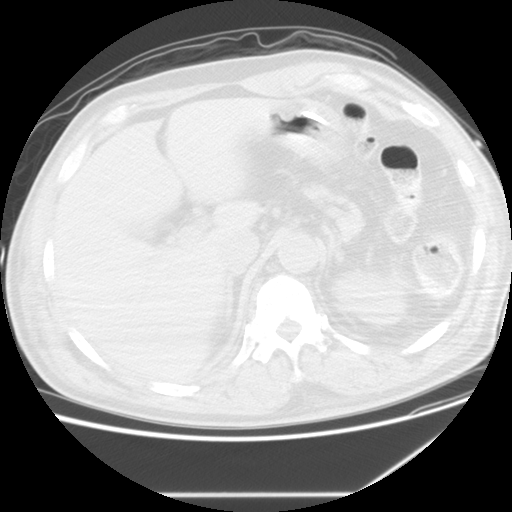
[im 15/67  lung]
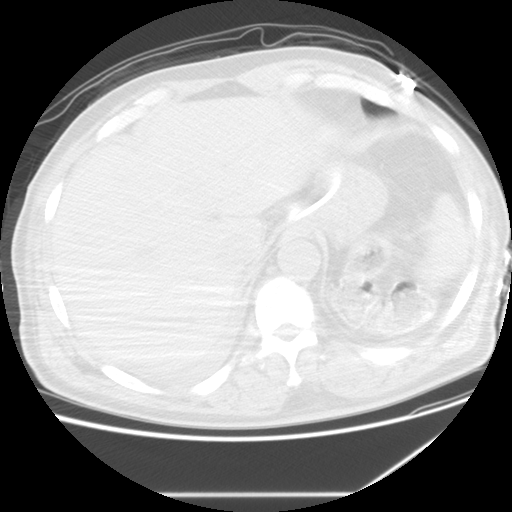
[im 20/67  lung]
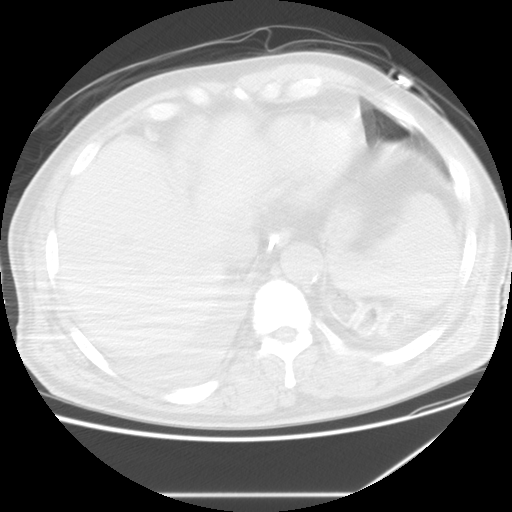
[im 25/67  mediastinal]
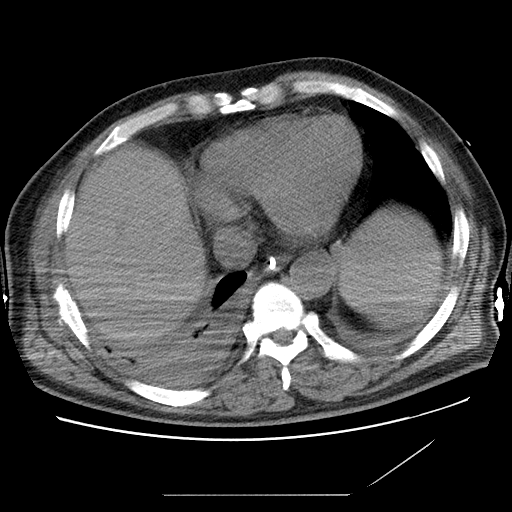
[im 25/67  lung]
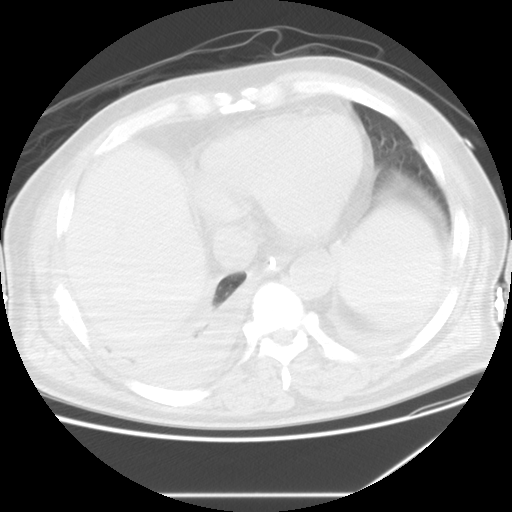
[im 30/67  lung]
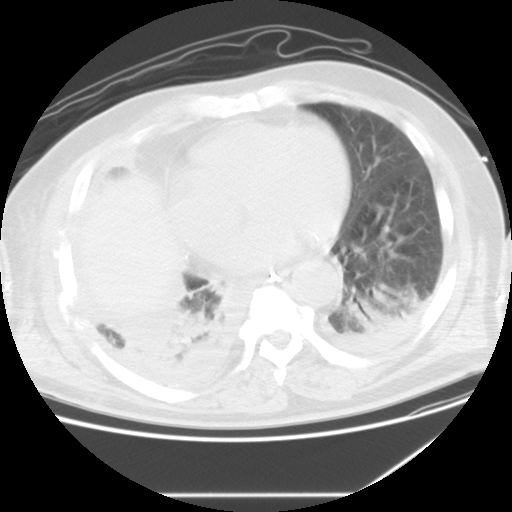
[im 37/67  lung]
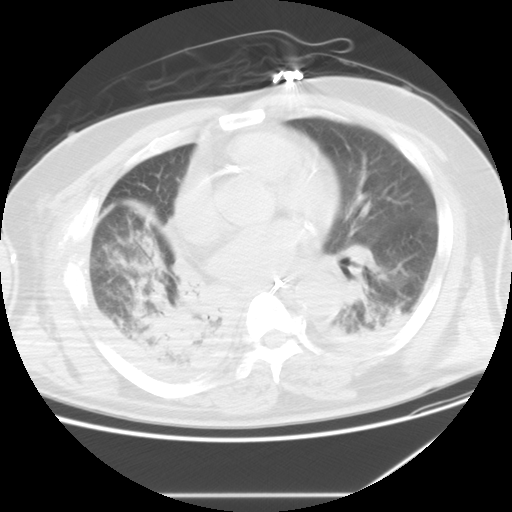
[im 42/67  lung]
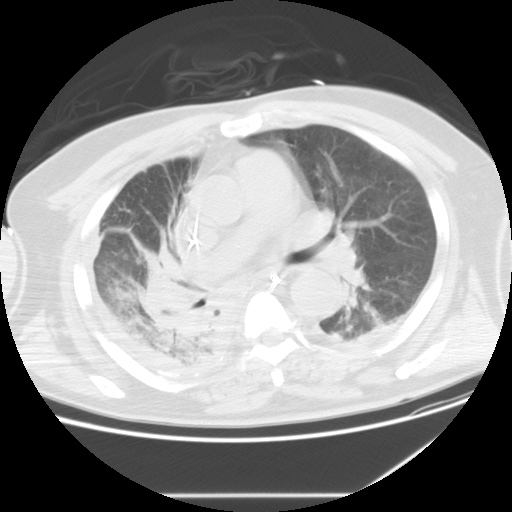
[im 47/67  mediastinal]
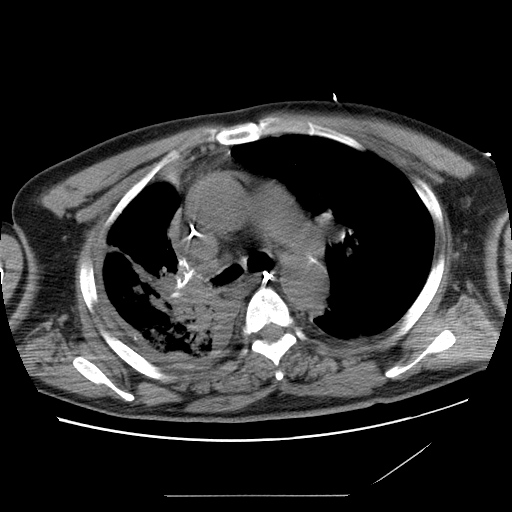
[im 47/67  lung]
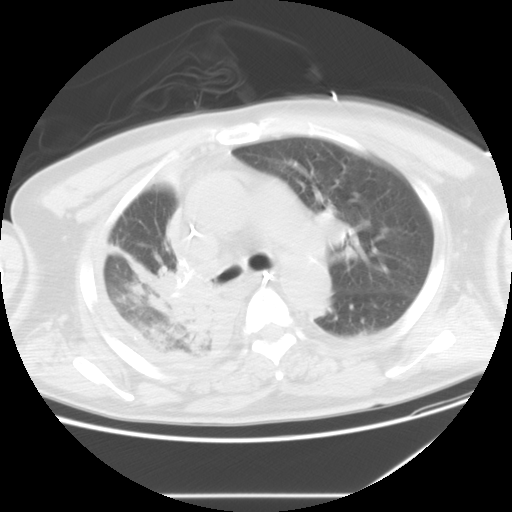
[im 52/67  lung]
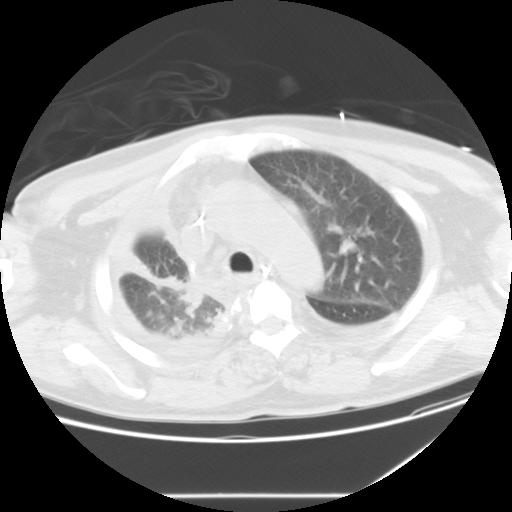
[im 57/67  lung]
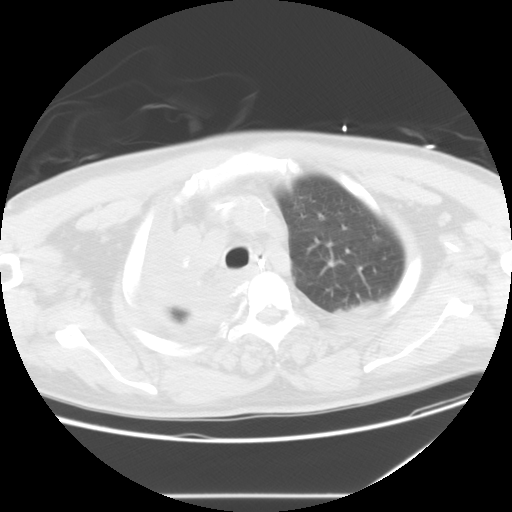
[im 62/67  lung]
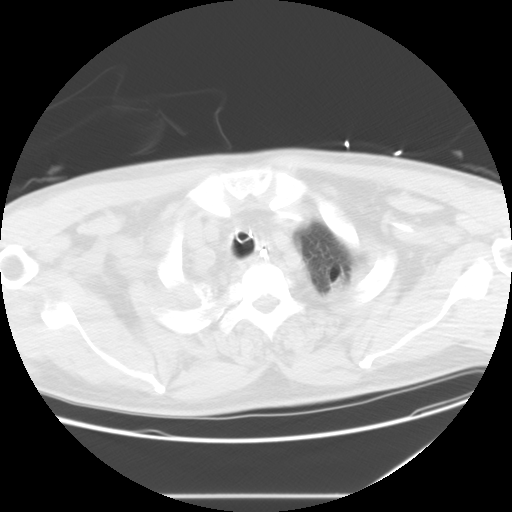

[Series 400: coronal chest · coronal · 0.72mm/px · 3 of 52 slices shown]
[im 11/52  lung]
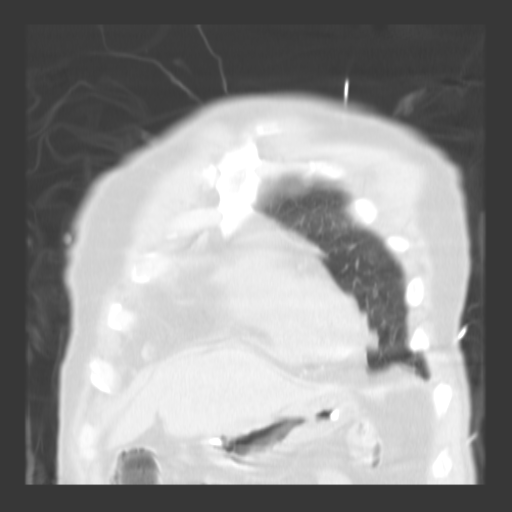
[im 21/52  lung]
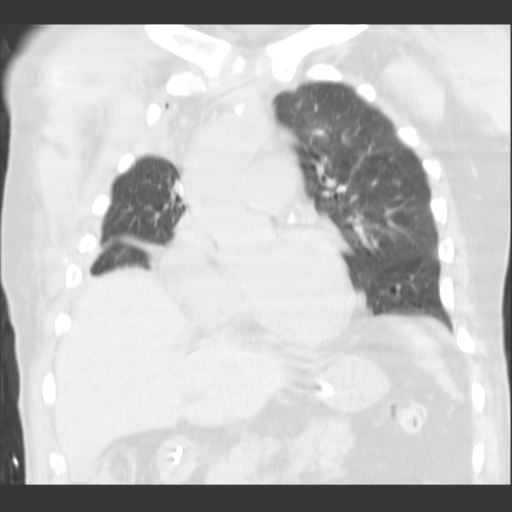
[im 31/52  lung]
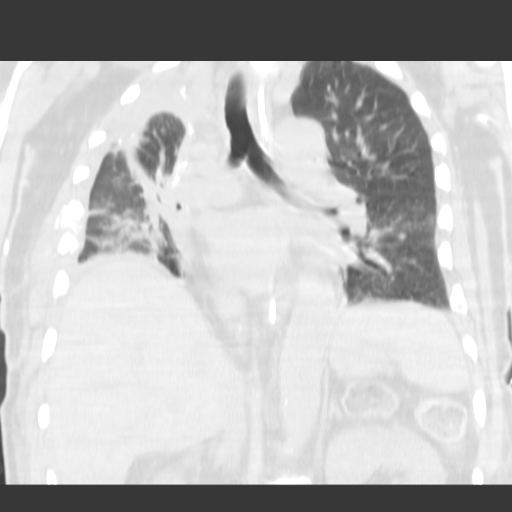

[15 of 36 positions shown; findings below may reference images not displayed]

FINDINGS: On the prior study, the patient had a large cavitary lesion in the right upper lobe.  The patient is status post resection.
 Chest wall, soft tissues and bony structures are unremarkable.  The patient has a left-sided PICC line in place.  There is a Panda tube coursing down into the stomach.  Heart is within normal limits in size.  No pericardial effusions.  Dense coronary artery calcifications are noted.  The aorta is normal in caliber.  There is a right-sided pleural effusion.  There is dense air space consolidation in the right lower lobe with prominent air bronchograms, likely pneumonia.  Surgical change in the right hilum from a right upper lobectomy.  Tiny dot of air anteriorly but no significant pneumothorax.  I don?t see any definite findings for pleural hematoma.  Left lung demonstrates a small effusion and left lower lobe atelectasis.
IMPRESSION: 1.  Right-sided pleural effusions with right lower lobe air space consolidation, likely pneumonia.  I don?t see any evidence for a pleural hematoma.  
 2.  Surgical changes involving the probable right upper lobe lobectomy.  Edema and atelectasis in the right upper lung.
 3.  Small left effusion and left basilar atelectasis.
 4.  No pericardial effusion.  
 5.  Dense coronary artery calcifications.

## 2008-10-13 IMAGING — CR DG CHEST 1V PORT
1 series · 1 of 1 positions shown · non-contrast
Comparison: 05/23/06.

CLINICAL DATA: Lung lesion.
 PORTABLE CHEST ? 1 VIEW:

[view not recorded]
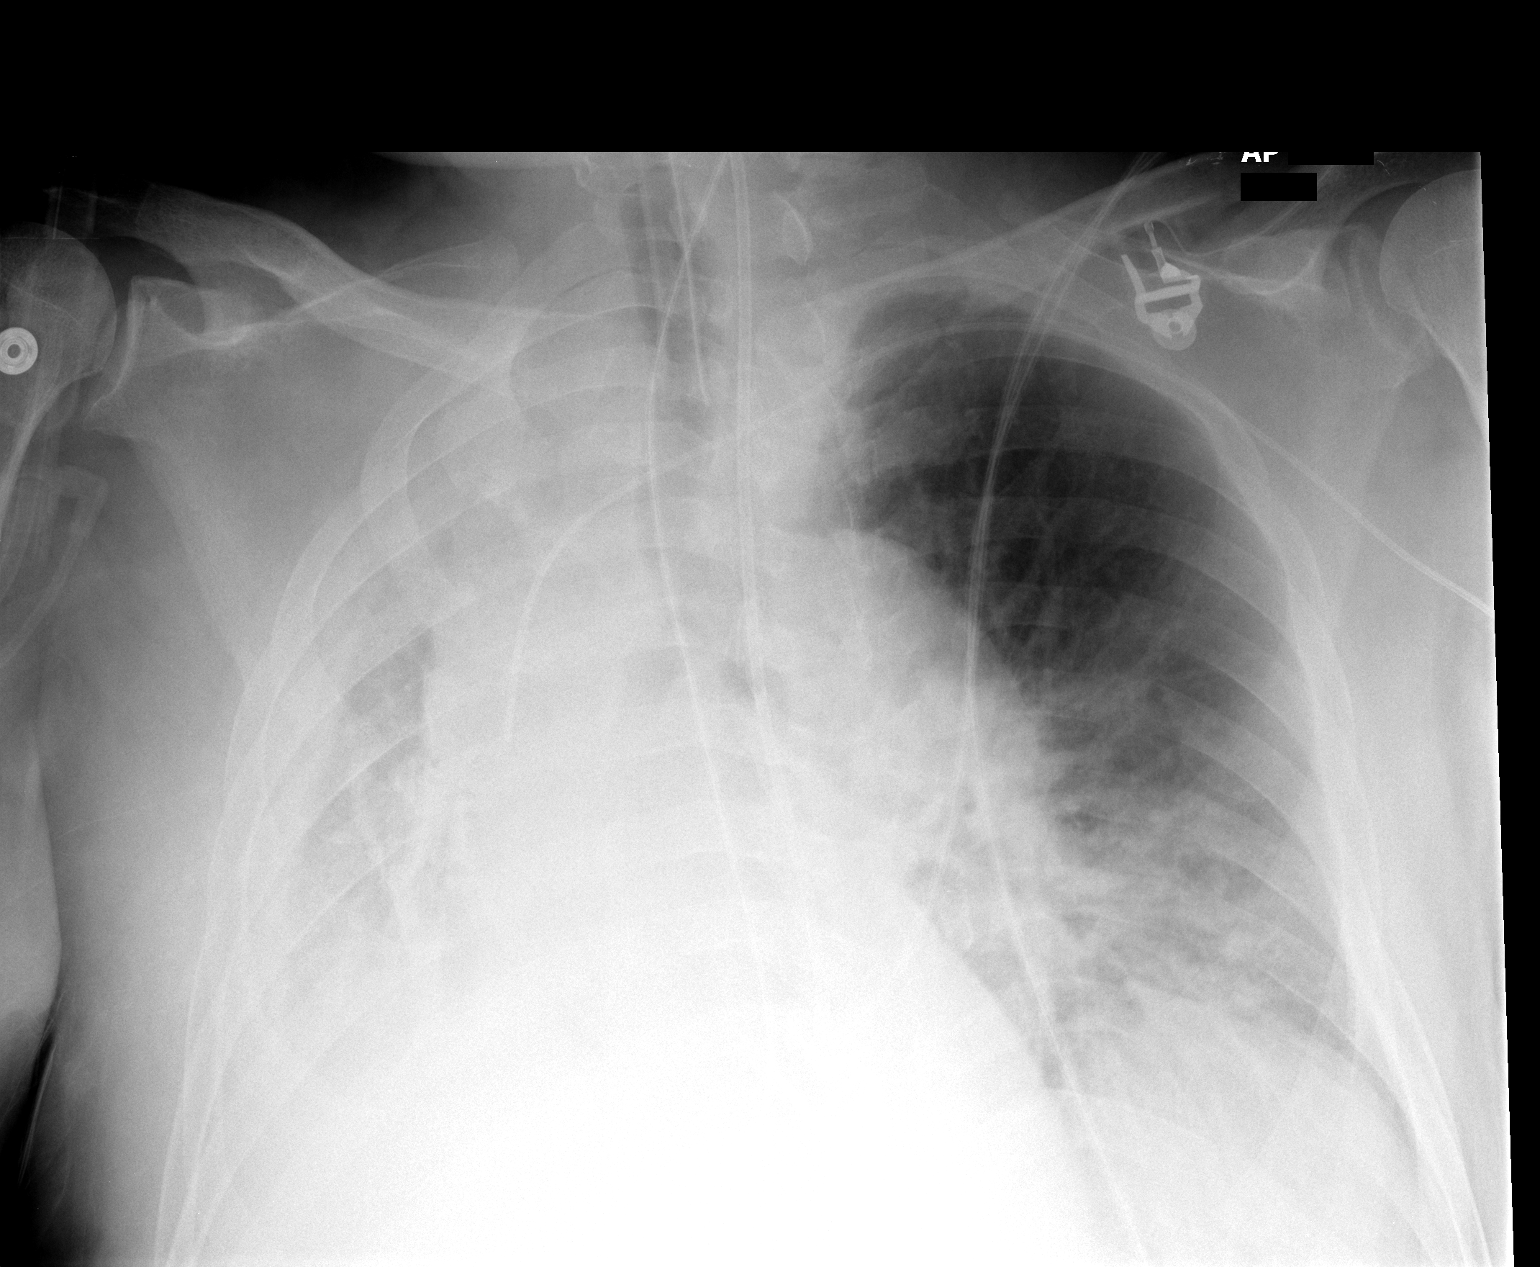

[1 of 1 positions shown; findings below may reference images not displayed]

FINDINGS: Support tubes and lines are unchanged.  The patient is rotated on the study.  There is persistent right effusion with extensive right-sided airspace disease.  Aeration of the right lung is worsened.  Alveolar and interstitial opacities in the left have also progressed.  Cardiomegaly.
IMPRESSION: Right pleural effusion with worsening bilateral airspace disease, right greater than left.

## 2008-10-14 ENCOUNTER — Encounter (INDEPENDENT_AMBULATORY_CARE_PROVIDER_SITE_OTHER): Payer: Self-pay | Admitting: *Deleted

## 2008-10-14 IMAGING — CR DG CHEST 1V PORT
1 series · 1 of 1 positions shown · non-contrast
Comparison: 05/24/06.

CLINICAL DATA: Lung mass. 
 PORTABLE CHEST ? 1 VIEW:

[view not recorded]
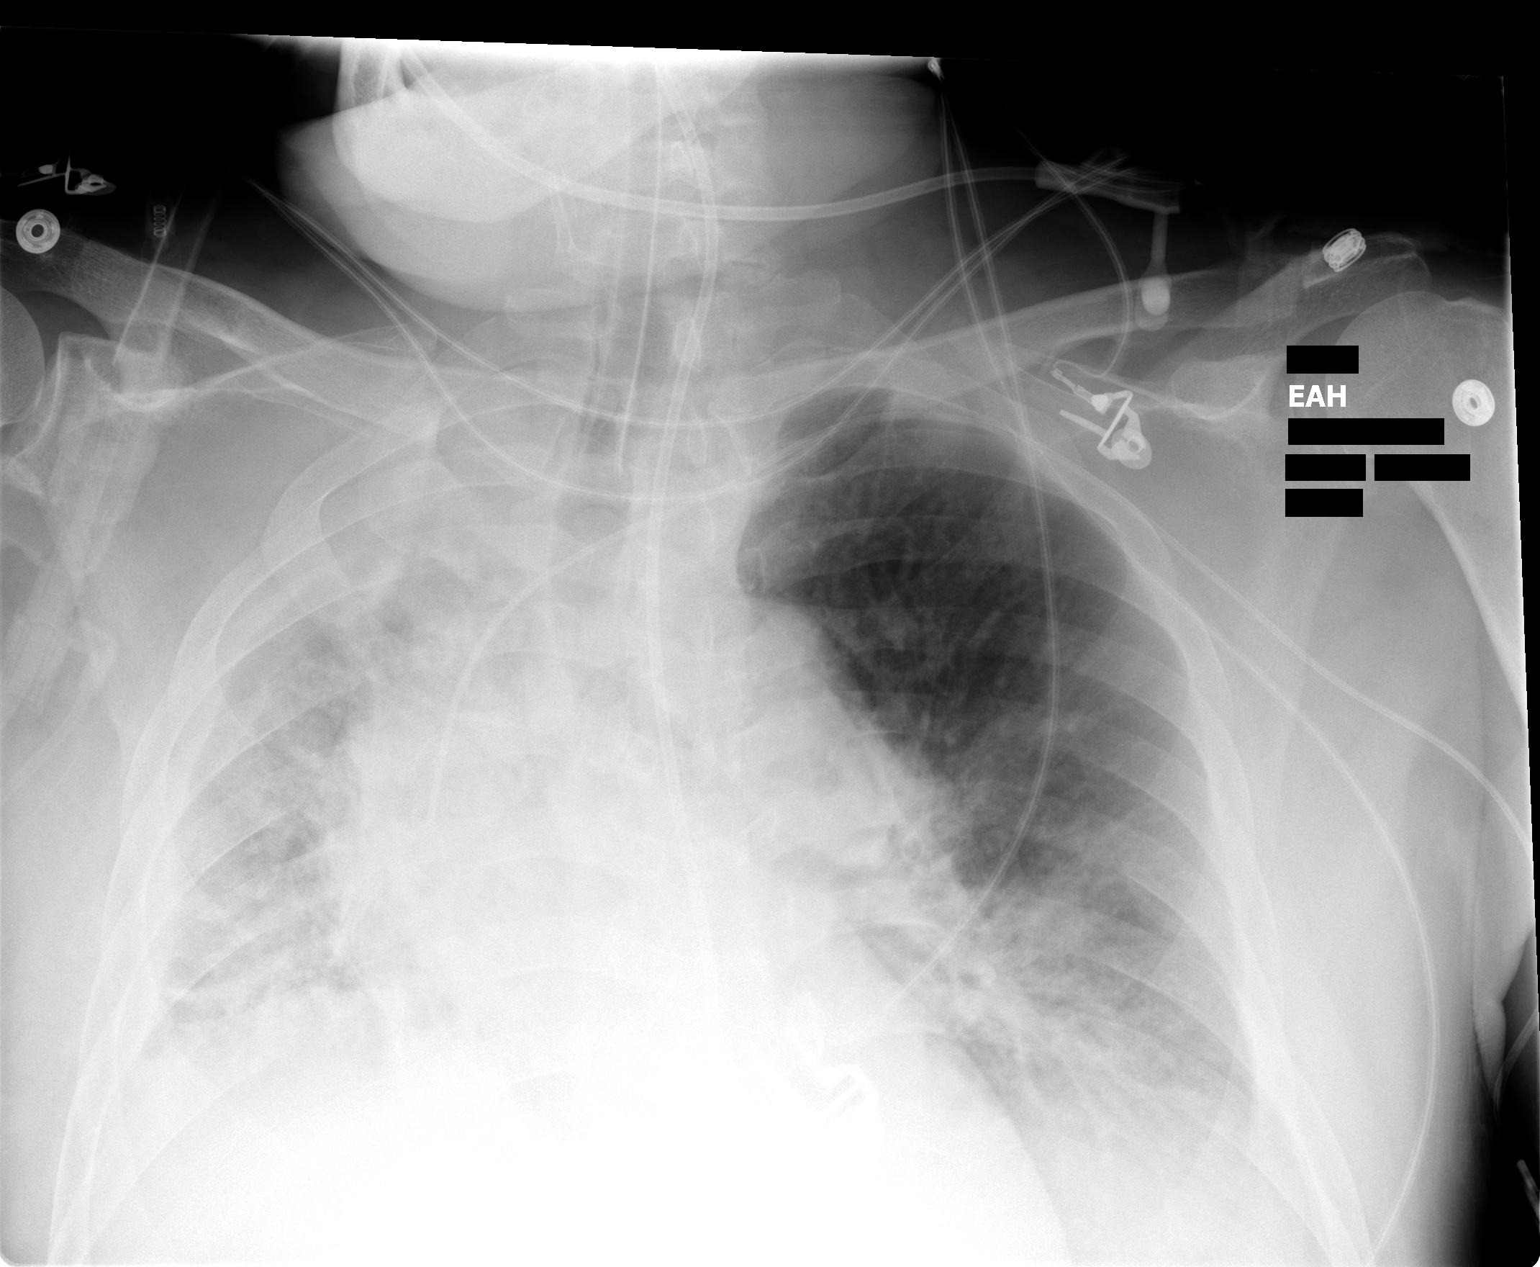

[1 of 1 positions shown; findings below may reference images not displayed]

FINDINGS: Support lines and tubes are unchanged.  Pleural and parenchymal opacities with volume loss in the right chest are again noted.  Interstitial edema is unchanged.  Heart size is upper normal.
IMPRESSION: No interval change.

## 2008-10-15 IMAGING — CR DG CHEST 1V PORT
1 series · 1 of 1 positions shown · non-contrast
Comparison: 05/25/06.

CLINICAL DATA: Lung mass. 
 PORTABLE CHEST ? 1 VIEW:

[view not recorded]
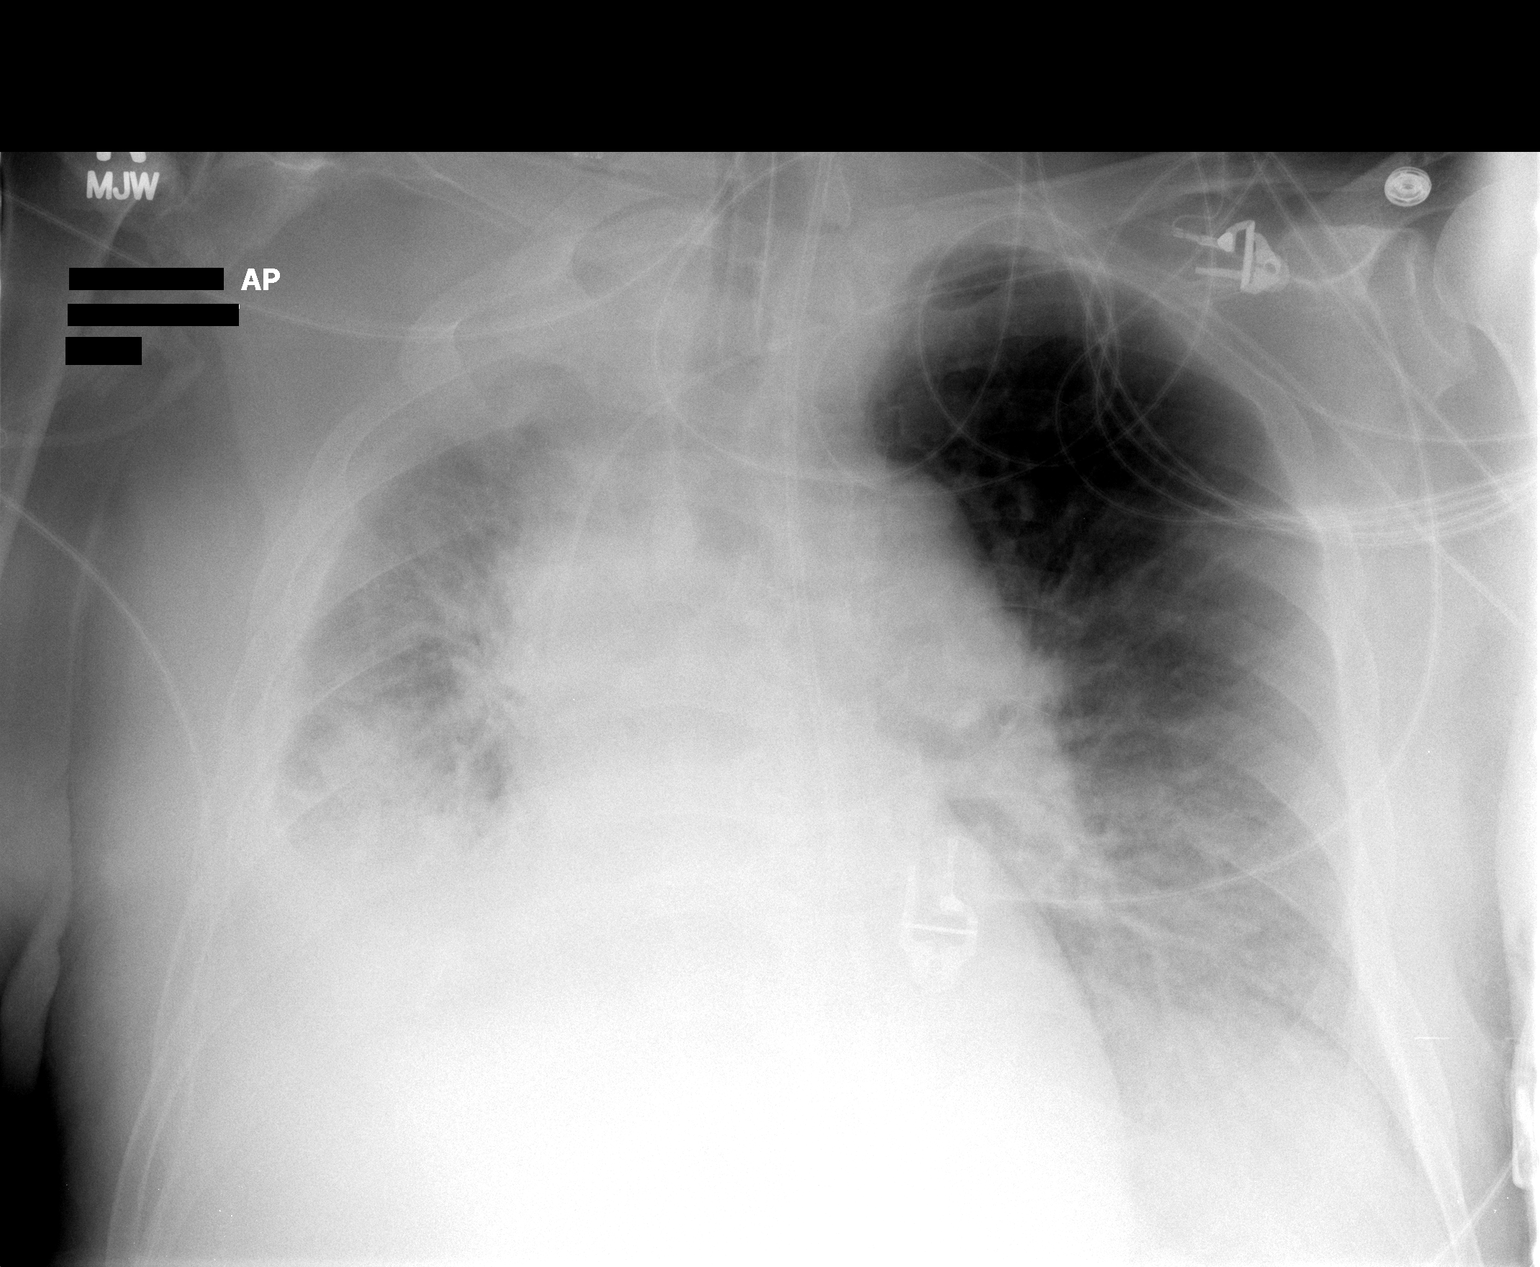

[1 of 1 positions shown; findings below may reference images not displayed]

FINDINGS: The left arm PICC line tip is in the SVC.  There is an ET tube, the tip of which is above the carina.   Weighted feeding tube is noted, the tip of which is below the field of view.  There are bilateral pleural effusions, right greater than left.  Multifocal airspace disease and edema are unchanged.
IMPRESSION: 1.  No significant change in aeration of the lungs. 
 2.  Persistent bilateral effusions.

## 2008-10-17 IMAGING — CR DG CHEST 1V PORT
1 series · 1 of 1 positions shown · non-contrast
Comparison: 05/26/06.

CLINICAL DATA: Lung mass, post tracheostomy.
 PORTABLE CHEST - 1 VIEW:

[view not recorded]
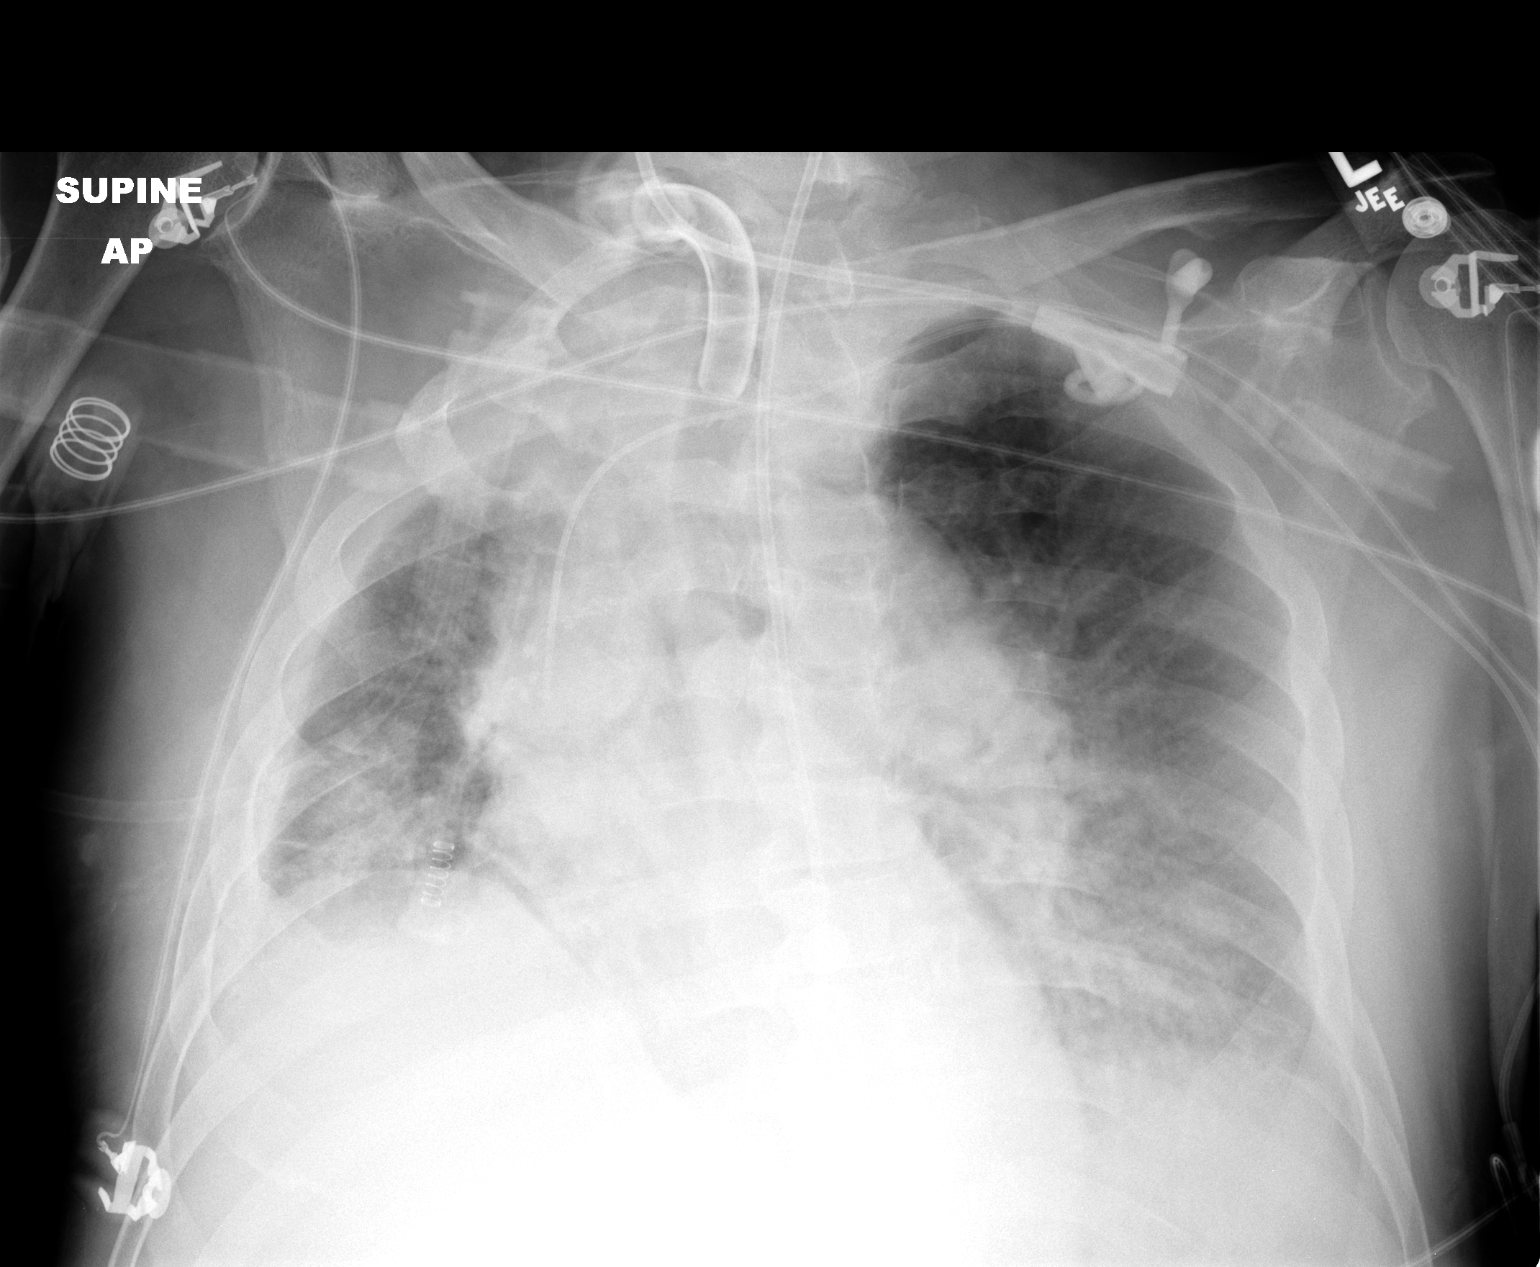

[1 of 1 positions shown; findings below may reference images not displayed]

FINDINGS: Tracheostomy is midline.  Left PICC terminates in the SVC.  Heart size stable.  Lungs are somewhat low in volume with elevation of the right hemidiaphragm.  Pulmonary edema and right pleural effusion.
IMPRESSION: Pulmonary edema and right pleural effusion.

## 2008-10-18 IMAGING — CR DG CHEST 1V PORT
1 series · 1 of 1 positions shown · non-contrast
Comparison: [HOSPITAL] chest CT 05/24/06, portable chest x-ray 05/26/06 at 2342 hours and 05/28/06 1577 hours.

CLINICAL DATA: Post right VATS for lung mass.  
PORTABLE CHEST - 1 VIEW 05/29/06 AT 2722 HOURS:

[view not recorded]
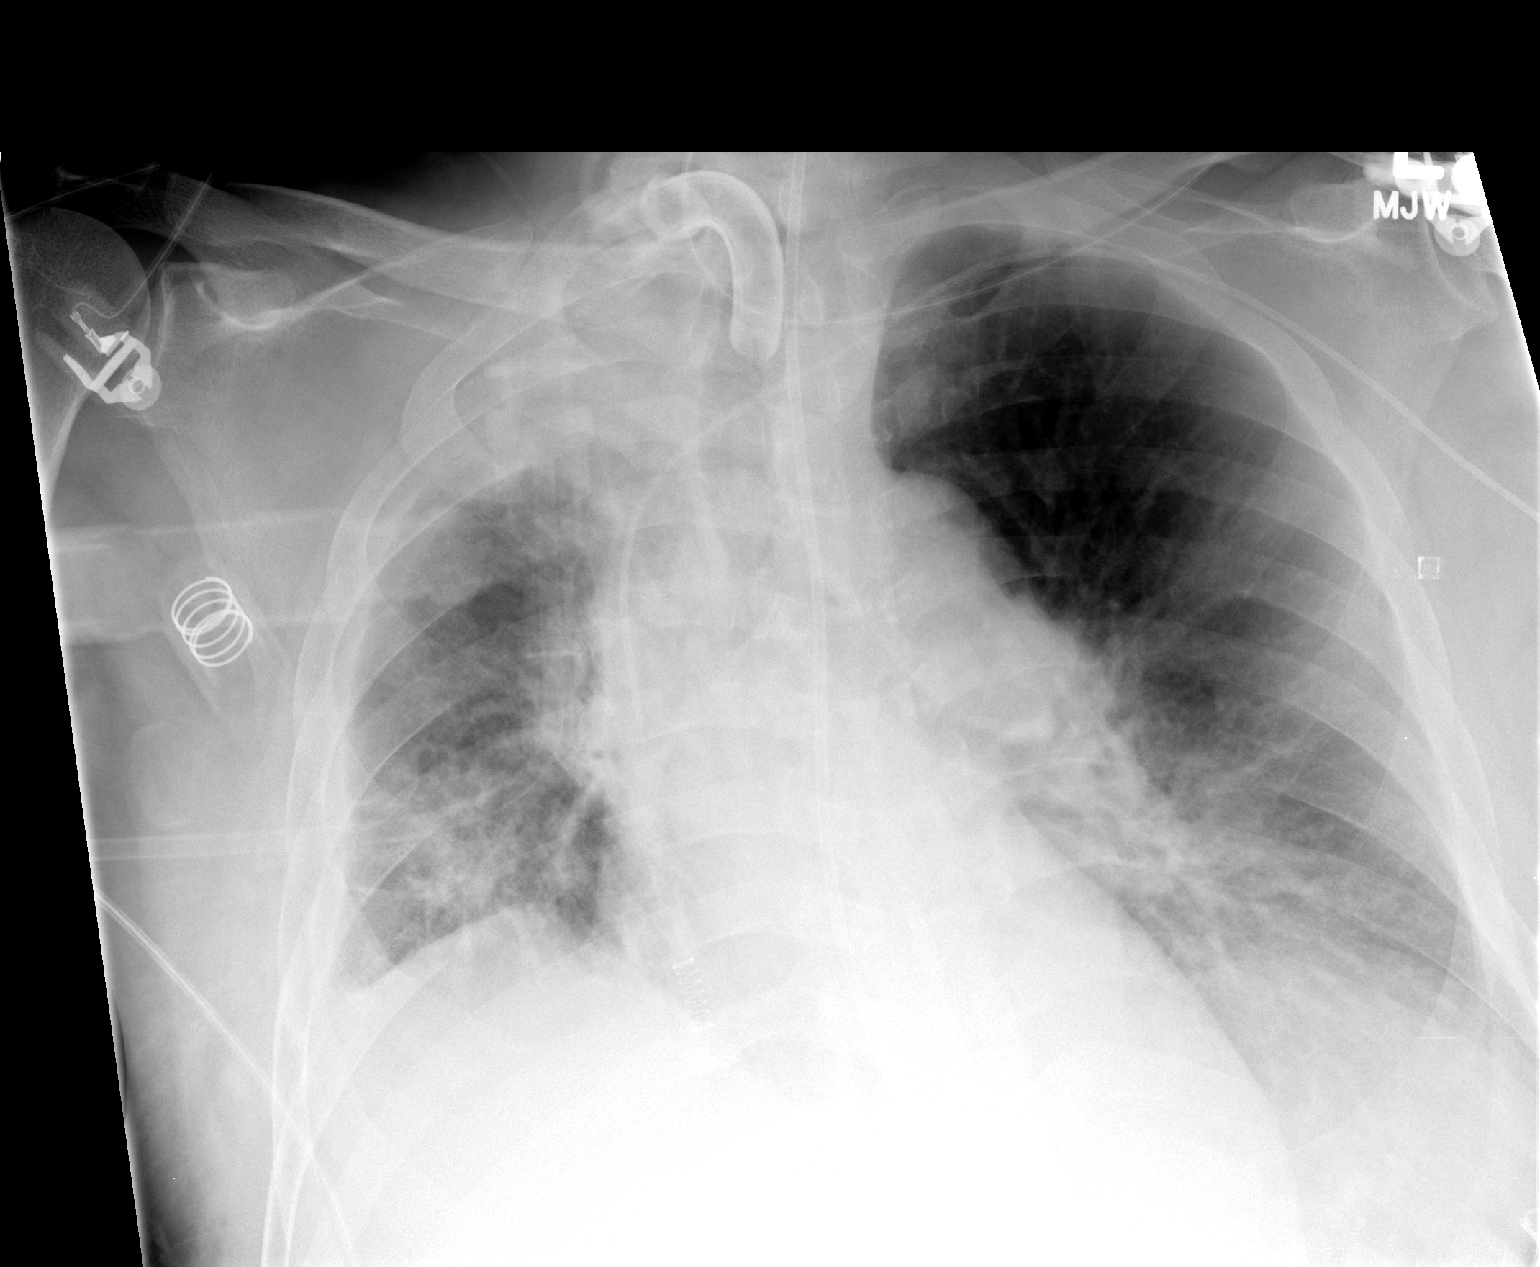

[1 of 1 positions shown; findings below may reference images not displayed]

FINDINGS: Since prior studies improvement in left perihilar opacities noted with stable right thoracotomy changes, and right pulmonary opacities at the right lung apex with atelectatic infiltrative change at the right and left lower lobes.  Left PICC line, tracheostomy tube, and panda tube placements are stable.  No new abnormality is seen with no pneumothorax noted.
IMPRESSION: 1.  Greater aeration left perihilar region. 
2.  Otherwise stable changes with right upper, right lower lobe pulmonary opacities and slight left basilar atelectatic infiltrative change. 
3.  Stable support apparatus.  
4.  No new abnormality seen.

## 2008-10-19 IMAGING — CR DG CHEST 1V PORT
1 series · 1 of 1 positions shown · non-contrast
Comparison: 05/29/06.

CLINICAL DATA: Lung mass.  
 PORTABLE CHEST - 1 VIEW 05/30/06:

[view not recorded]
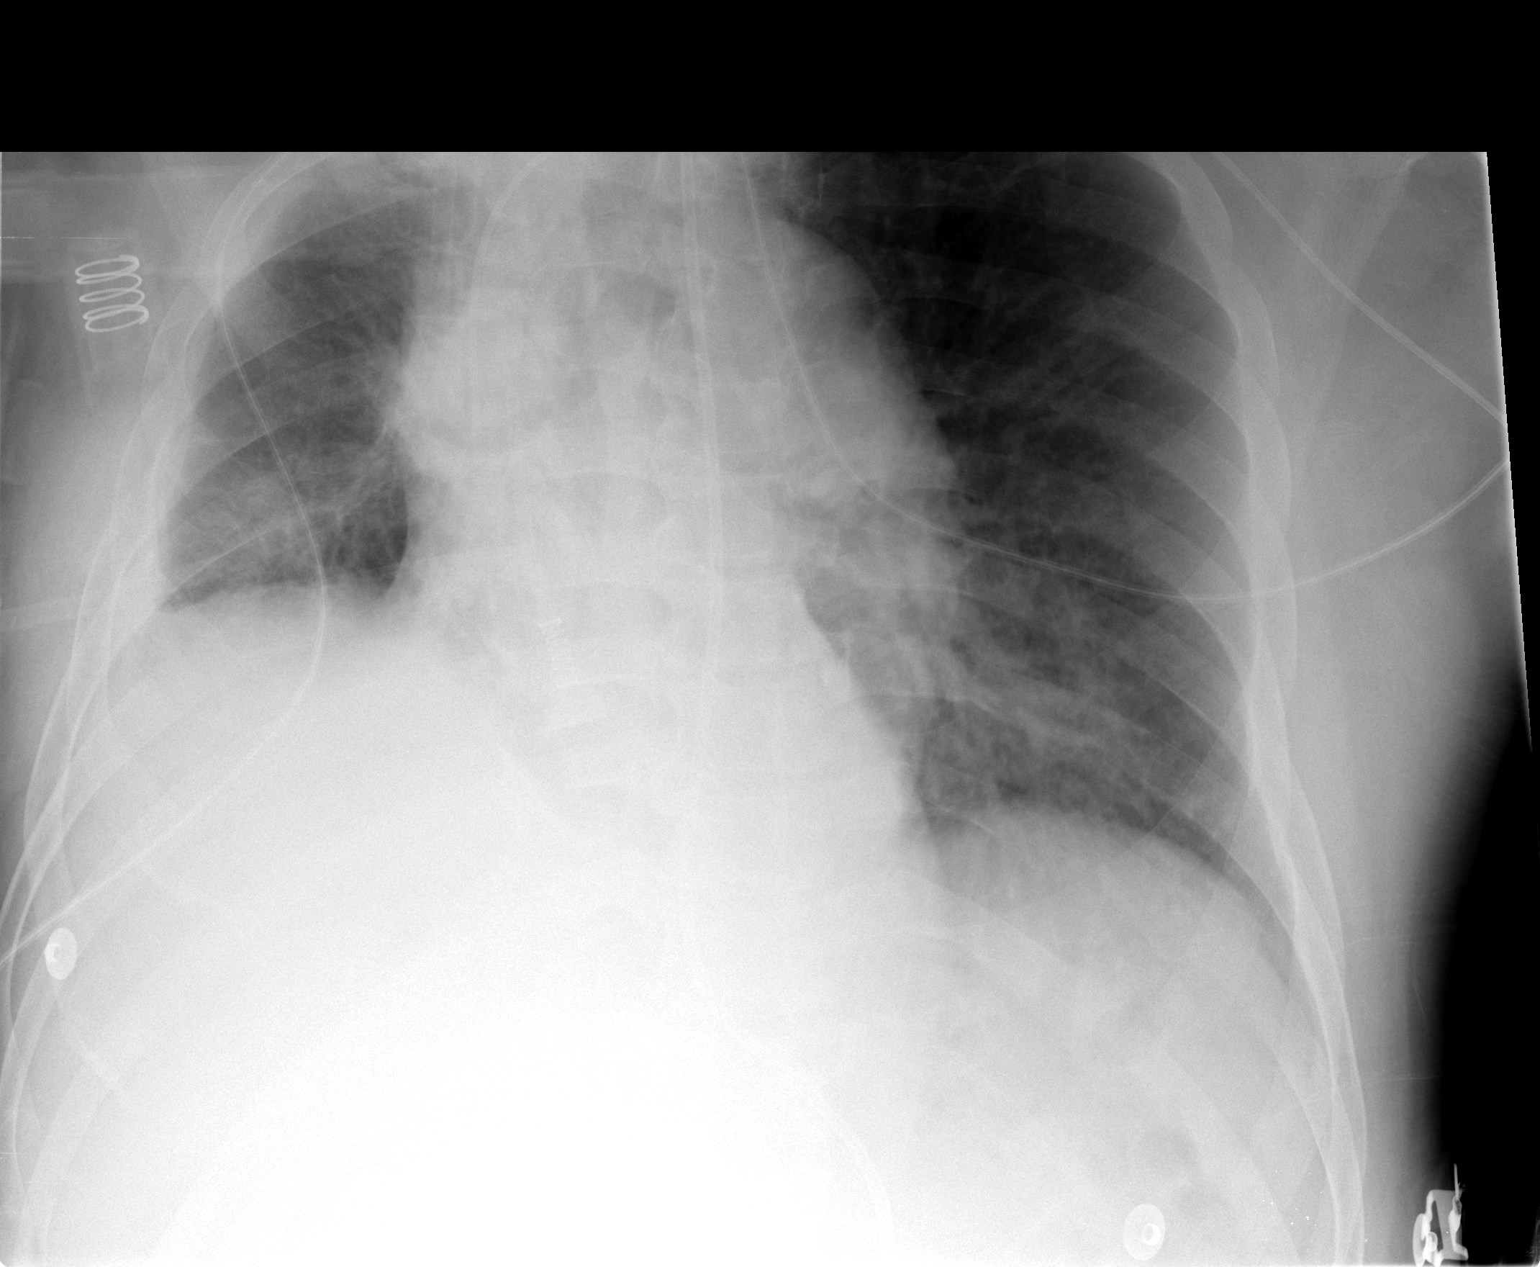

[1 of 1 positions shown; findings below may reference images not displayed]

FINDINGS: Again seen is marked volume loss in the right chest with elevation of the right hemidiaphragm.  Right effusion and basilar airspace disease persists.  Left base aeration is improved slightly.  Support tubes and lines are unchanged.
IMPRESSION: Persistent volume loss in the right chest with pleural effusion and airspace disease.  Left basilar aeration is improved.

## 2008-10-20 IMAGING — CR DG CHEST 1V PORT
1 series · 1 of 1 positions shown · non-contrast
Comparison: 05/30/2006

CLINICAL DATA: Lung mass

PORTABLE CHEST - 1 VIEW:

[view not recorded]
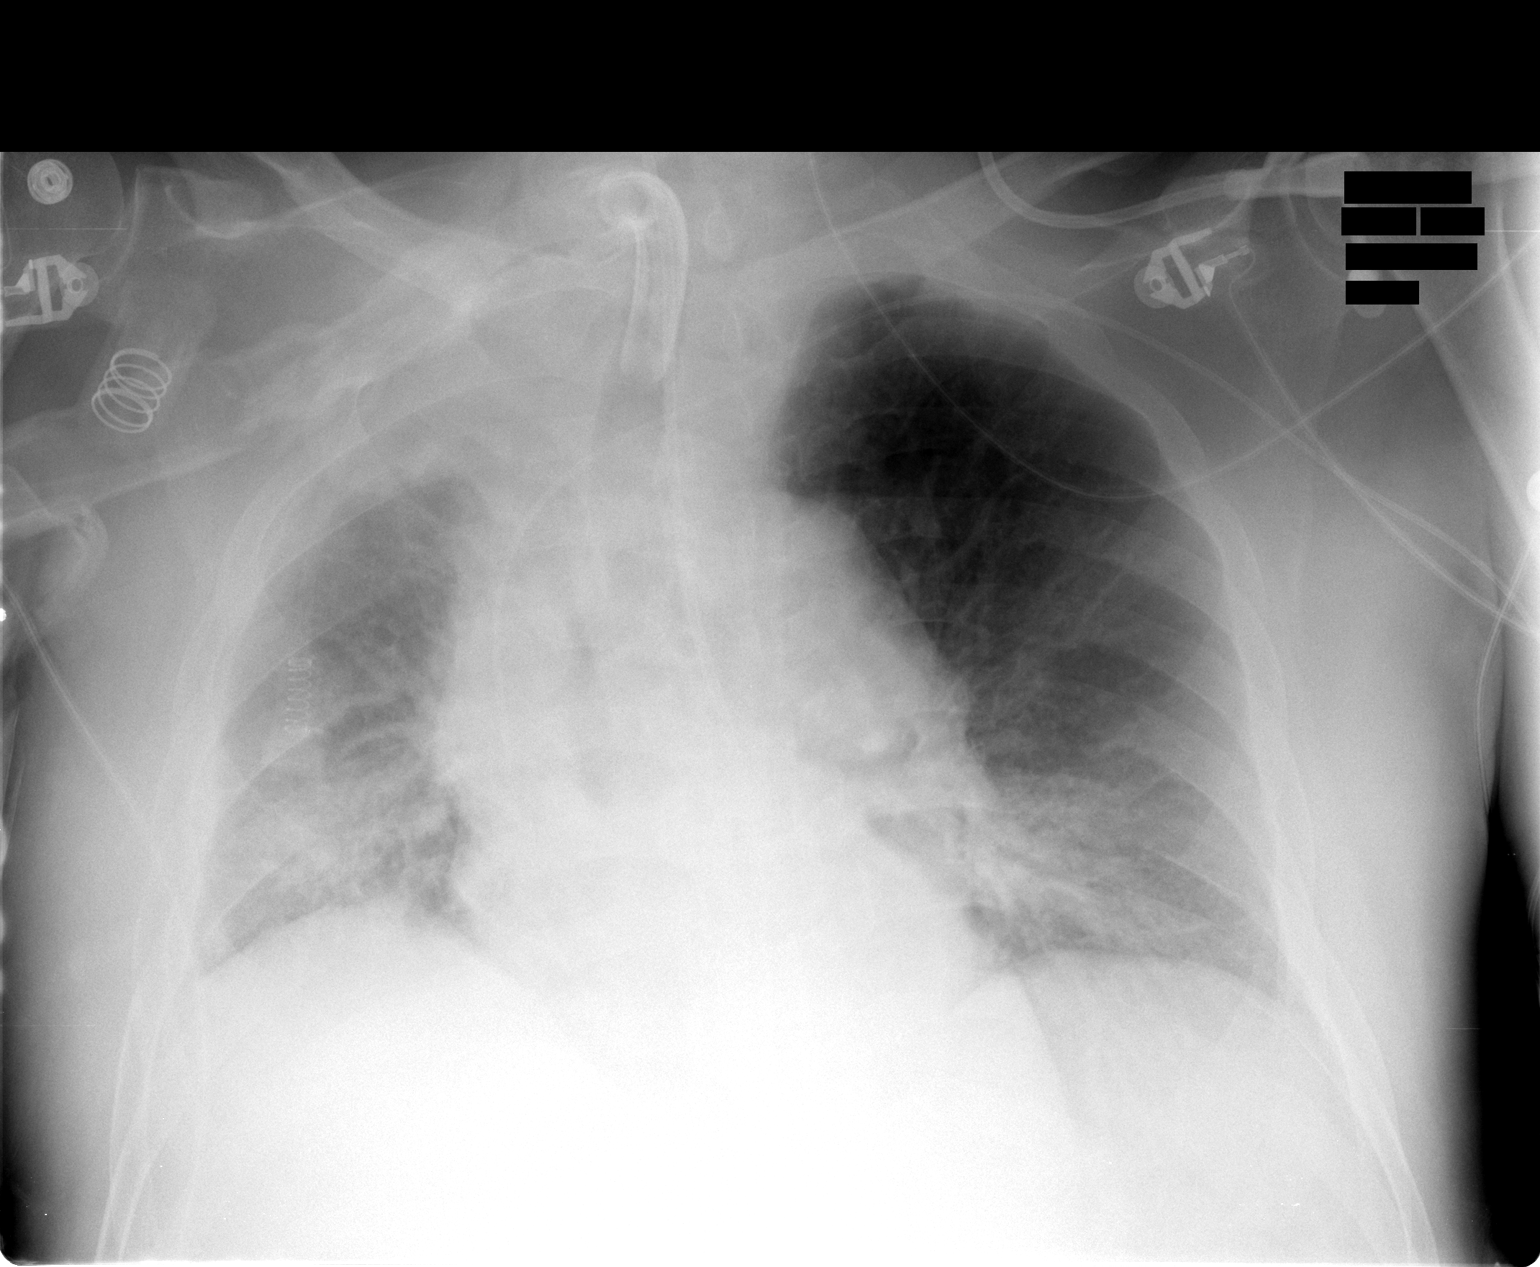

[1 of 1 positions shown; findings below may reference images not displayed]

FINDINGS: Support devices are in stable position. There is persistent volume
loss on the right with small right effusion and diffuse right lung airspace
disease. Continued left lower lobe opacity which may have increased slightly
since prior study.
IMPRESSION: No change on the right. Increasing left lower lobe airspace disease.

## 2008-10-21 IMAGING — CR DG CHEST 1V PORT
1 series · 1 of 1 positions shown · non-contrast
Comparison: Portable chest x-ray yesterday and 05/30/2006.

CLINICAL DATA: Prior right upper lobectomy. Ventilator dependent respiratory
failure. Followup pneumonia.

PORTABLE CHEST - 1 VIEW  [DATE]/0775 7077 hours:

[view not recorded]
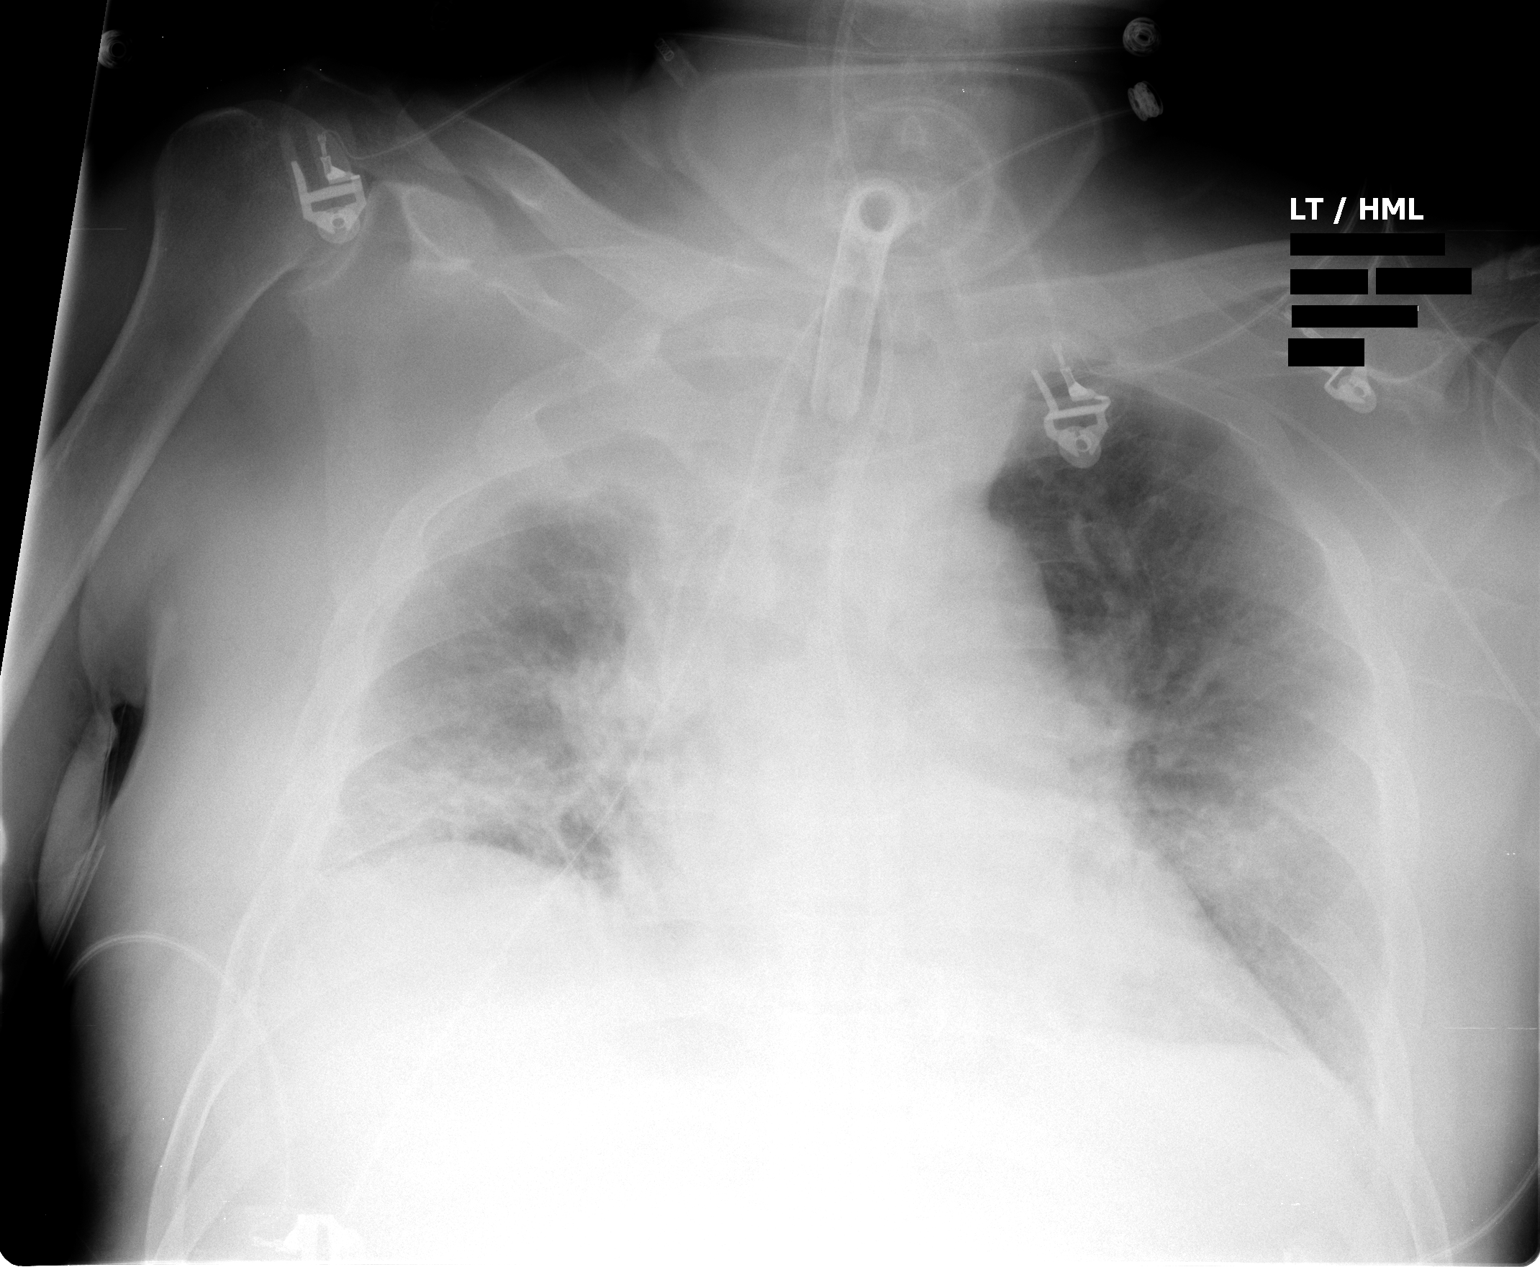

[1 of 1 positions shown; findings below may reference images not displayed]

FINDINGS: The airspace consolidation in the lung bases is unchanged from
yesterday, though worse than 2 days ago. The post-surgical right apical pleural
scarring is noted. The heart is enlarged but stable. Pulmonary venous
hypertension is present without overt edema. The tracheostomy tube tip remains
in satisfactory position below the thoracic inlet. The left arm PICC tip remains
in the SVC.
IMPRESSION: Tubes and lines satisfactory. Persistent pneumonia in the lung bases. No new
abnormalities.

## 2008-10-23 IMAGING — CR DG ABD PORTABLE 1V
1 series · 1 of 1 positions shown · non-contrast
Comparison: none

CLINICAL DATA: Lung mass.
 PORTABLE ABDOMEN ? 1 VIEW ? 06/03/06 ? 4977 HOURS:

[view not recorded]
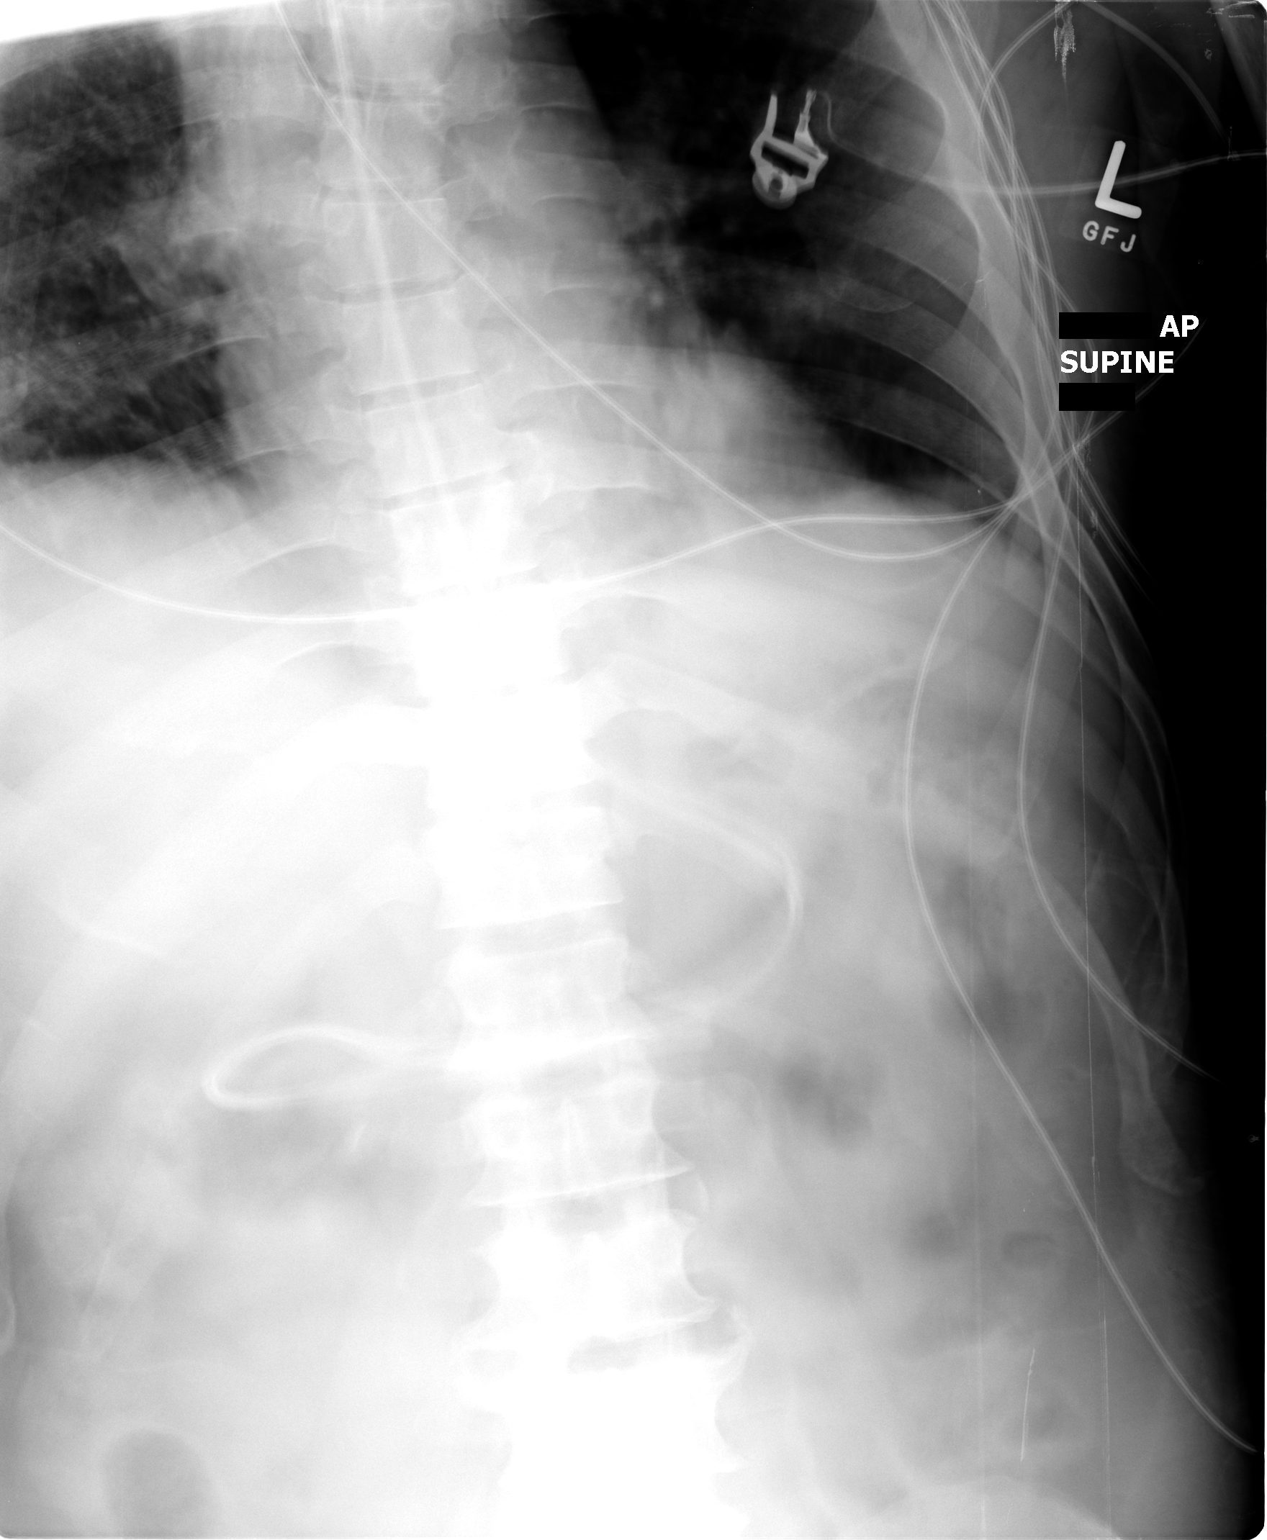

[1 of 1 positions shown; findings below may reference images not displayed]

FINDINGS: The feeding tube remains coiled in the antrum.  Exam is otherwise stable.
IMPRESSION: Feeding tube is coiled in the gastric antrum.

## 2008-10-23 IMAGING — CR DG CHEST 1V PORT
1 series · 1 of 1 positions shown · non-contrast
Comparison: none

CLINICAL DATA: VATS.   Chest tube.  Lung mass. 
DIAGNOSTIC CHEST PORTABLE ? 1 VIEW ([DATE] HOURS):

[view not recorded]
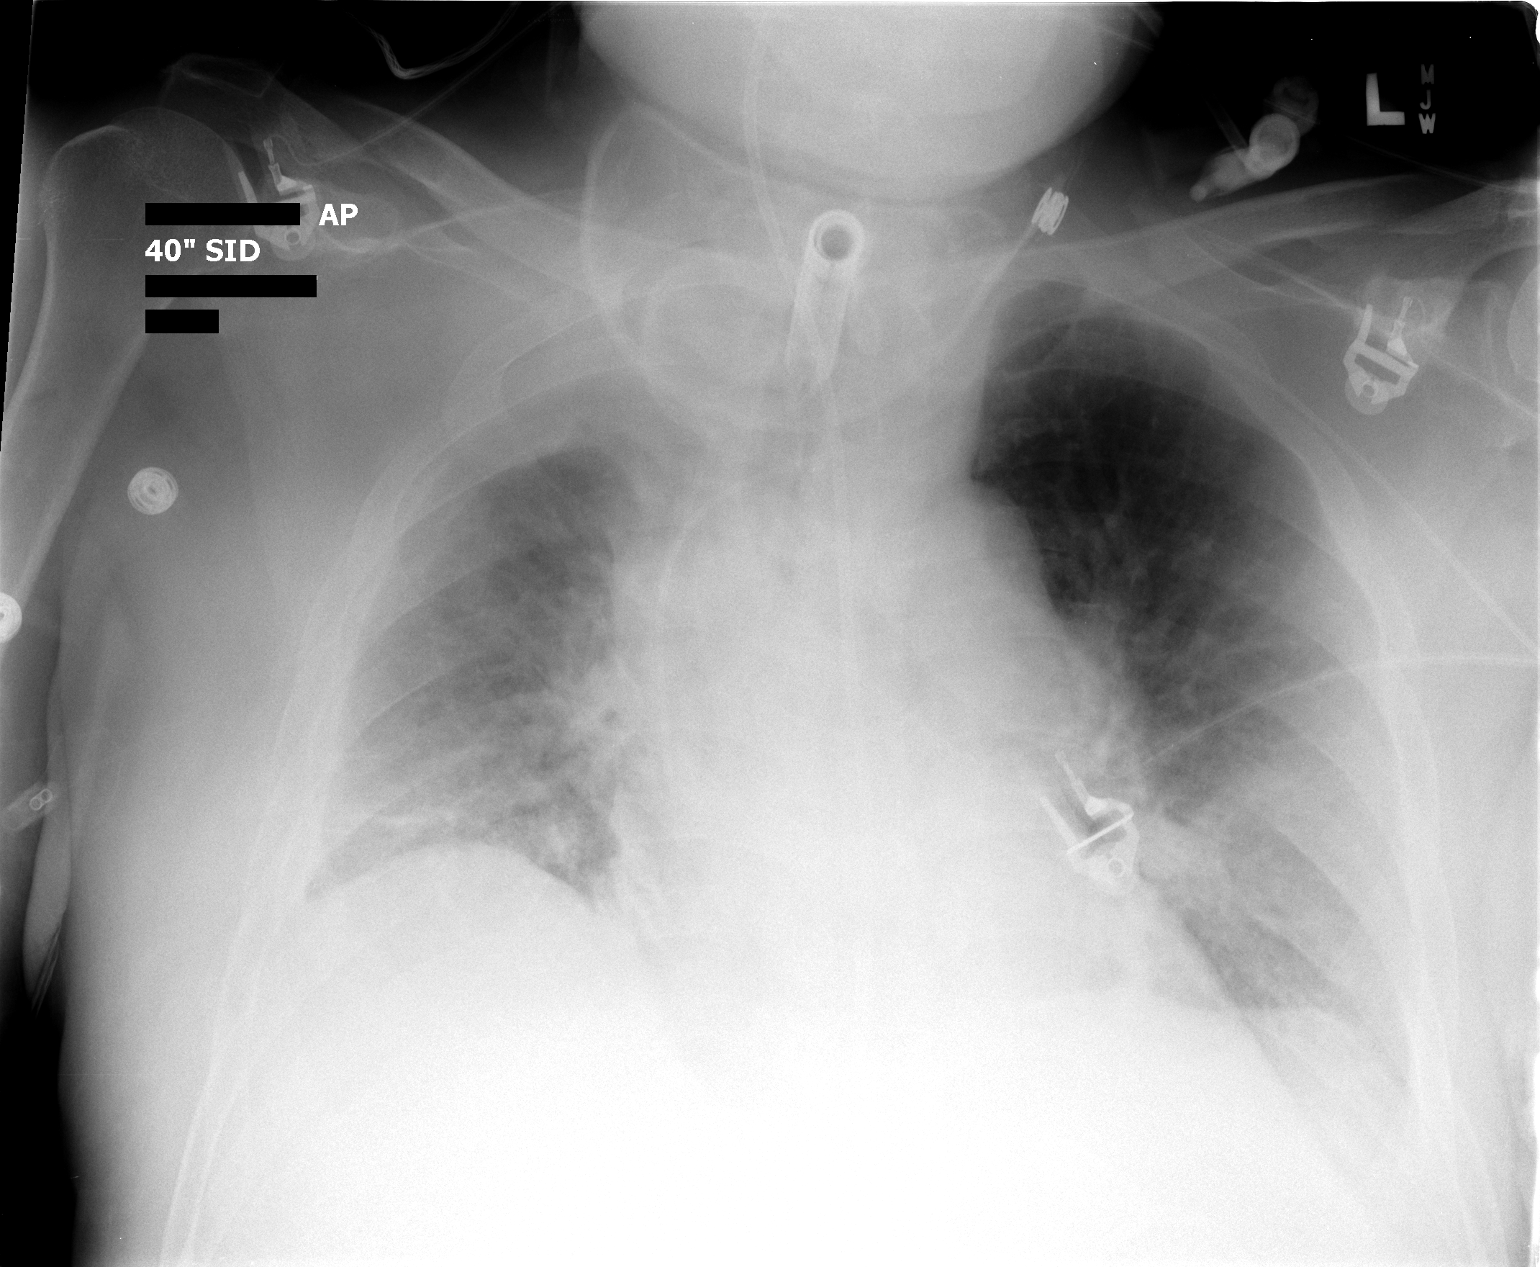

[1 of 1 positions shown; findings below may reference images not displayed]

FINDINGS: Tracheostomy tube, feeding tube, and left PICC lines are stable.  Submaximal inspiration is seen with slight increased aeration at the bilateral lung bases with otherwise stable bibasilar right greater than left atelectatic/infiltrative change.  No new abnormality is seen.
IMPRESSION: 1.  Submaximal inspiration with lesser yet persistent right greater than left bibasilar atelectatic/infiltrative change.  
2.  Stable support apparatus.

## 2008-10-23 IMAGING — CR DG ABD PORTABLE 1V
1 series · 1 of 1 positions shown · non-contrast
Comparison: none

CLINICAL DATA: Lung mass.
 PORTABLE ABDOMEN - 1 VIEW ? 06/03/06 AT 1349 HOURS:

[view not recorded]
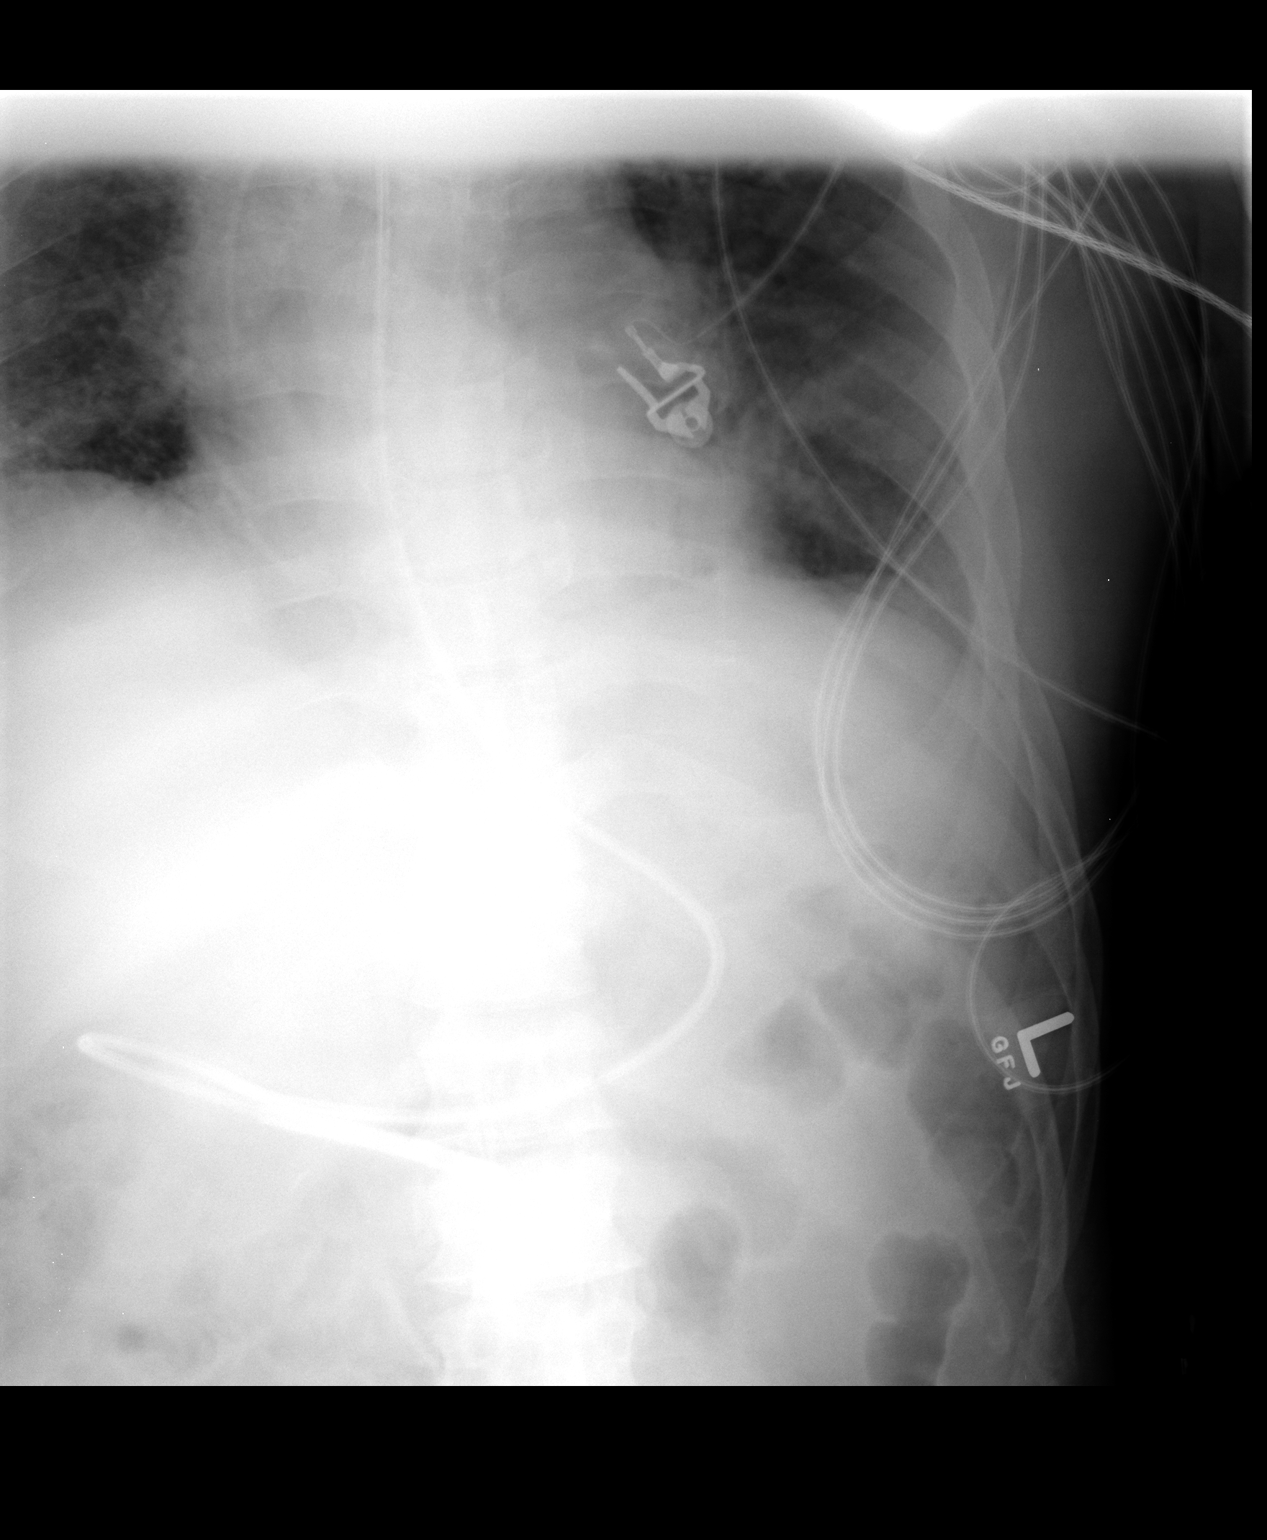

[1 of 1 positions shown; findings below may reference images not displayed]

FINDINGS: The feeding tube is coiled in the antrum. The bowel gas pattern is unremarkable.
IMPRESSION: Feeding tube is coiled in the antrum.

## 2008-10-24 IMAGING — CR DG ABD PORTABLE 1V
1 series · 1 of 1 positions shown · non-contrast
Comparison: none

CLINICAL DATA: 60-year-old with lung mass.  Panda placement.
 PORTABLE ABDOMEN - 1 VIEW:
 Panda tube is in stable position.  It is looped back on itself with its tip in the body region of the stomach.

[view not recorded]
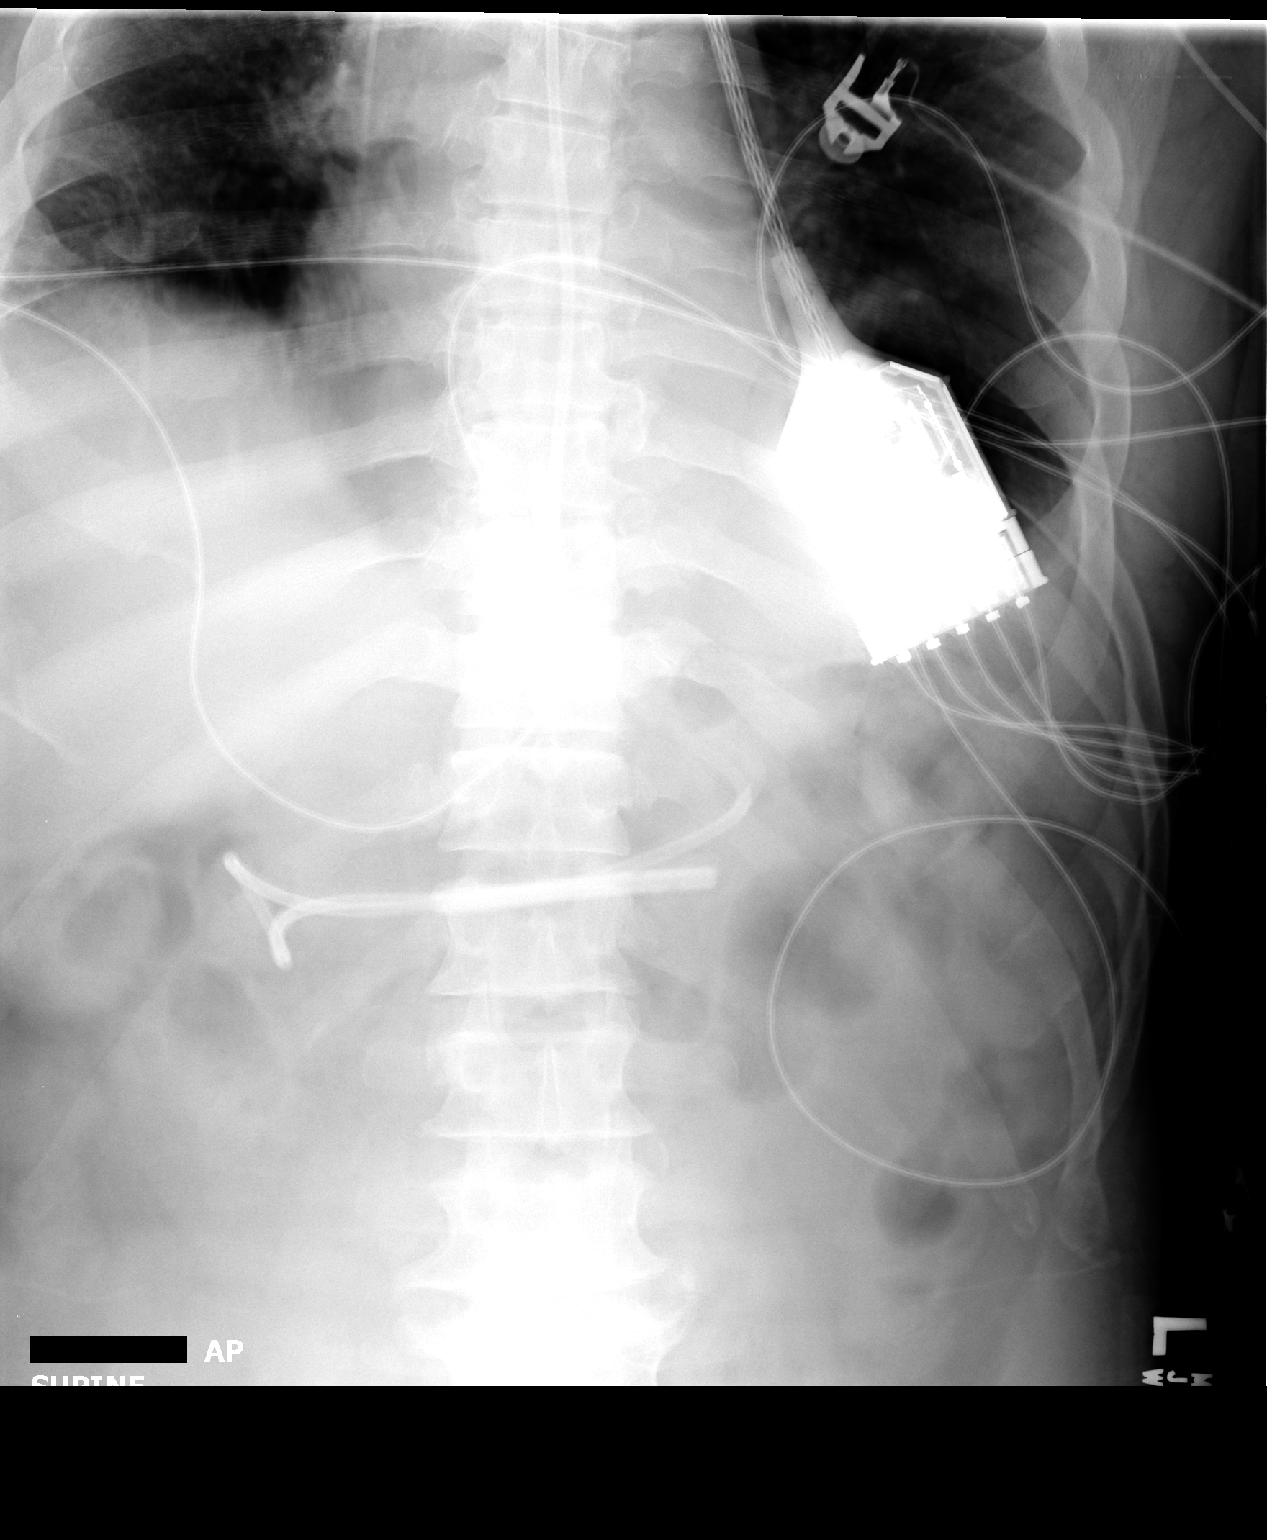

[1 of 1 positions shown; findings below may reference images not displayed]

IMPRESSION: Panda tube coiled back on itself with its tip in the body region of the stomach.

## 2008-10-24 IMAGING — CR DG ABD PORTABLE 1V
1 series · 1 of 1 positions shown · non-contrast
Comparison: 06/03/2006

CLINICAL DATA: Panda placement. 
 PORTABLE ABDOMEN - 1 VIEW:

[view not recorded]
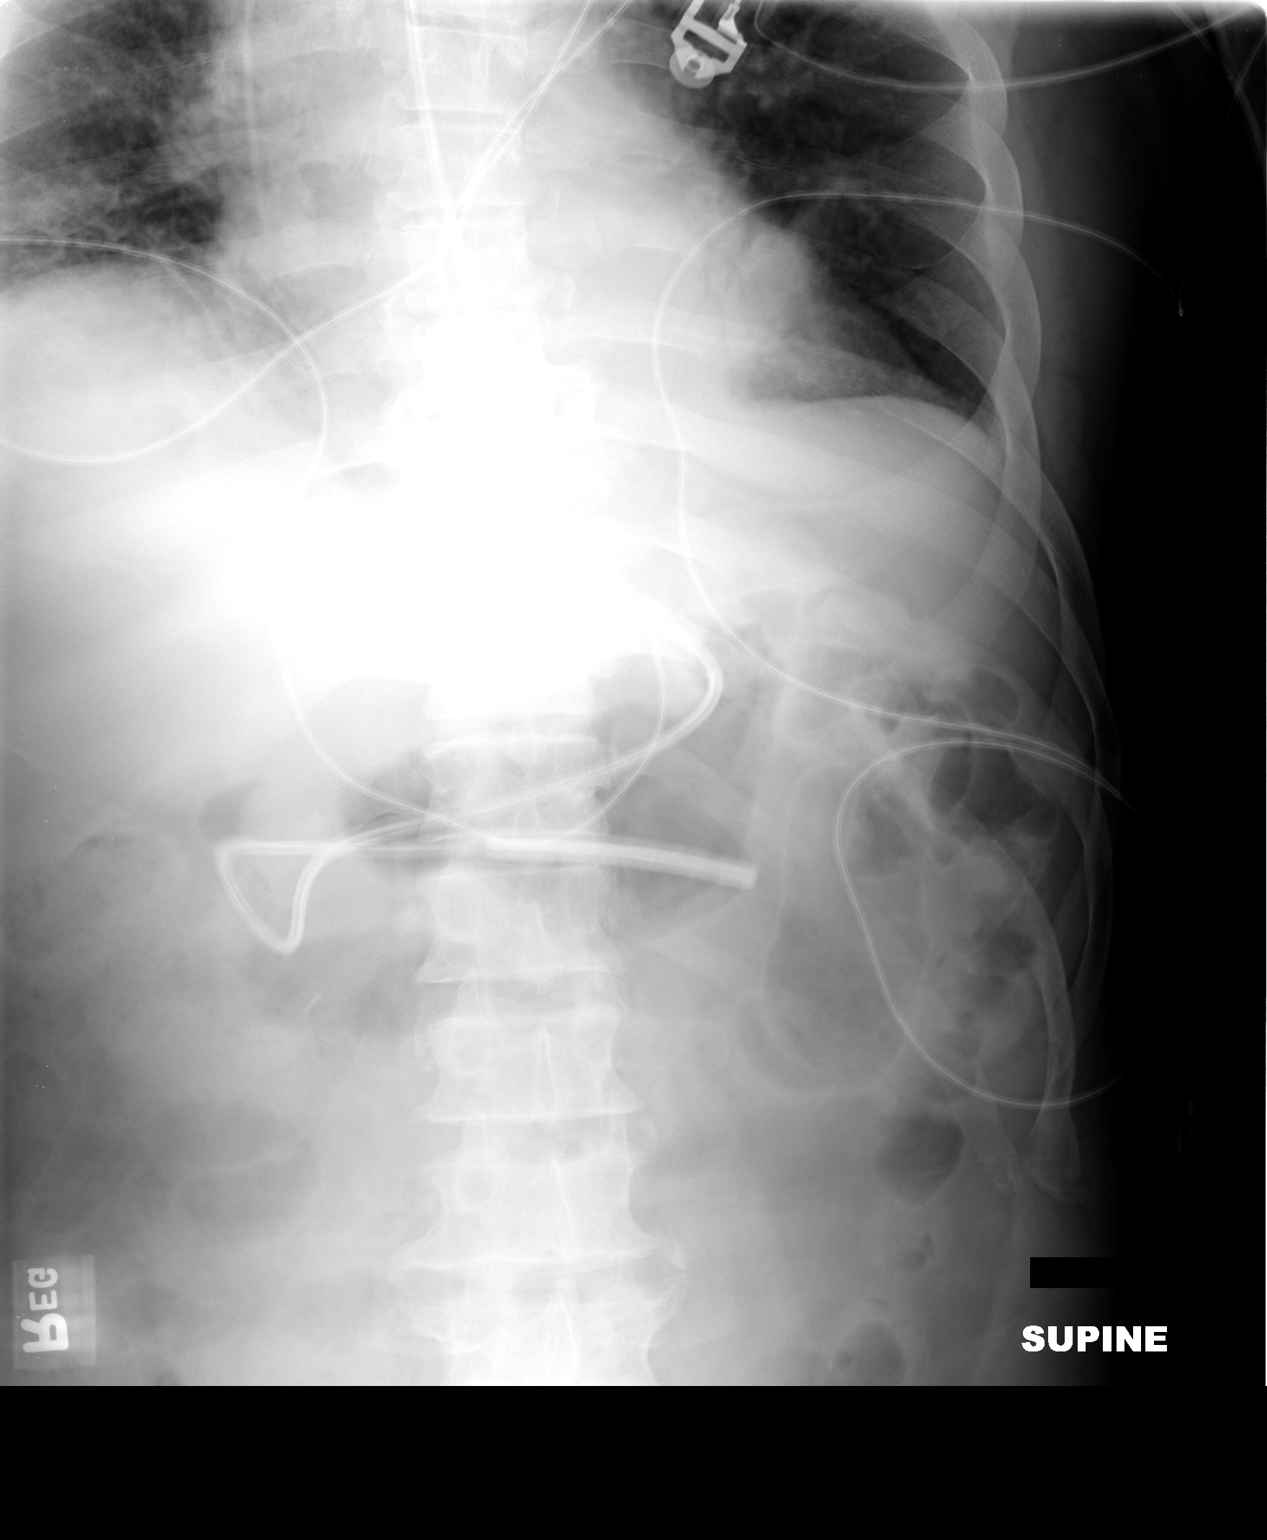

[1 of 1 positions shown; findings below may reference images not displayed]

FINDINGS: Panda tube is again seen looped in the antrum with the tip projecting toward the fundus.
IMPRESSION: As discussed above.

## 2008-10-25 IMAGING — CR DG ABD PORTABLE 1V
1 series · 1 of 1 positions shown · non-contrast
Comparison: none

CLINICAL DATA: Lung cancer. Feeding tube placement.
 PORTABLE ABDOMEN - 1 VIEW:

[view not recorded]
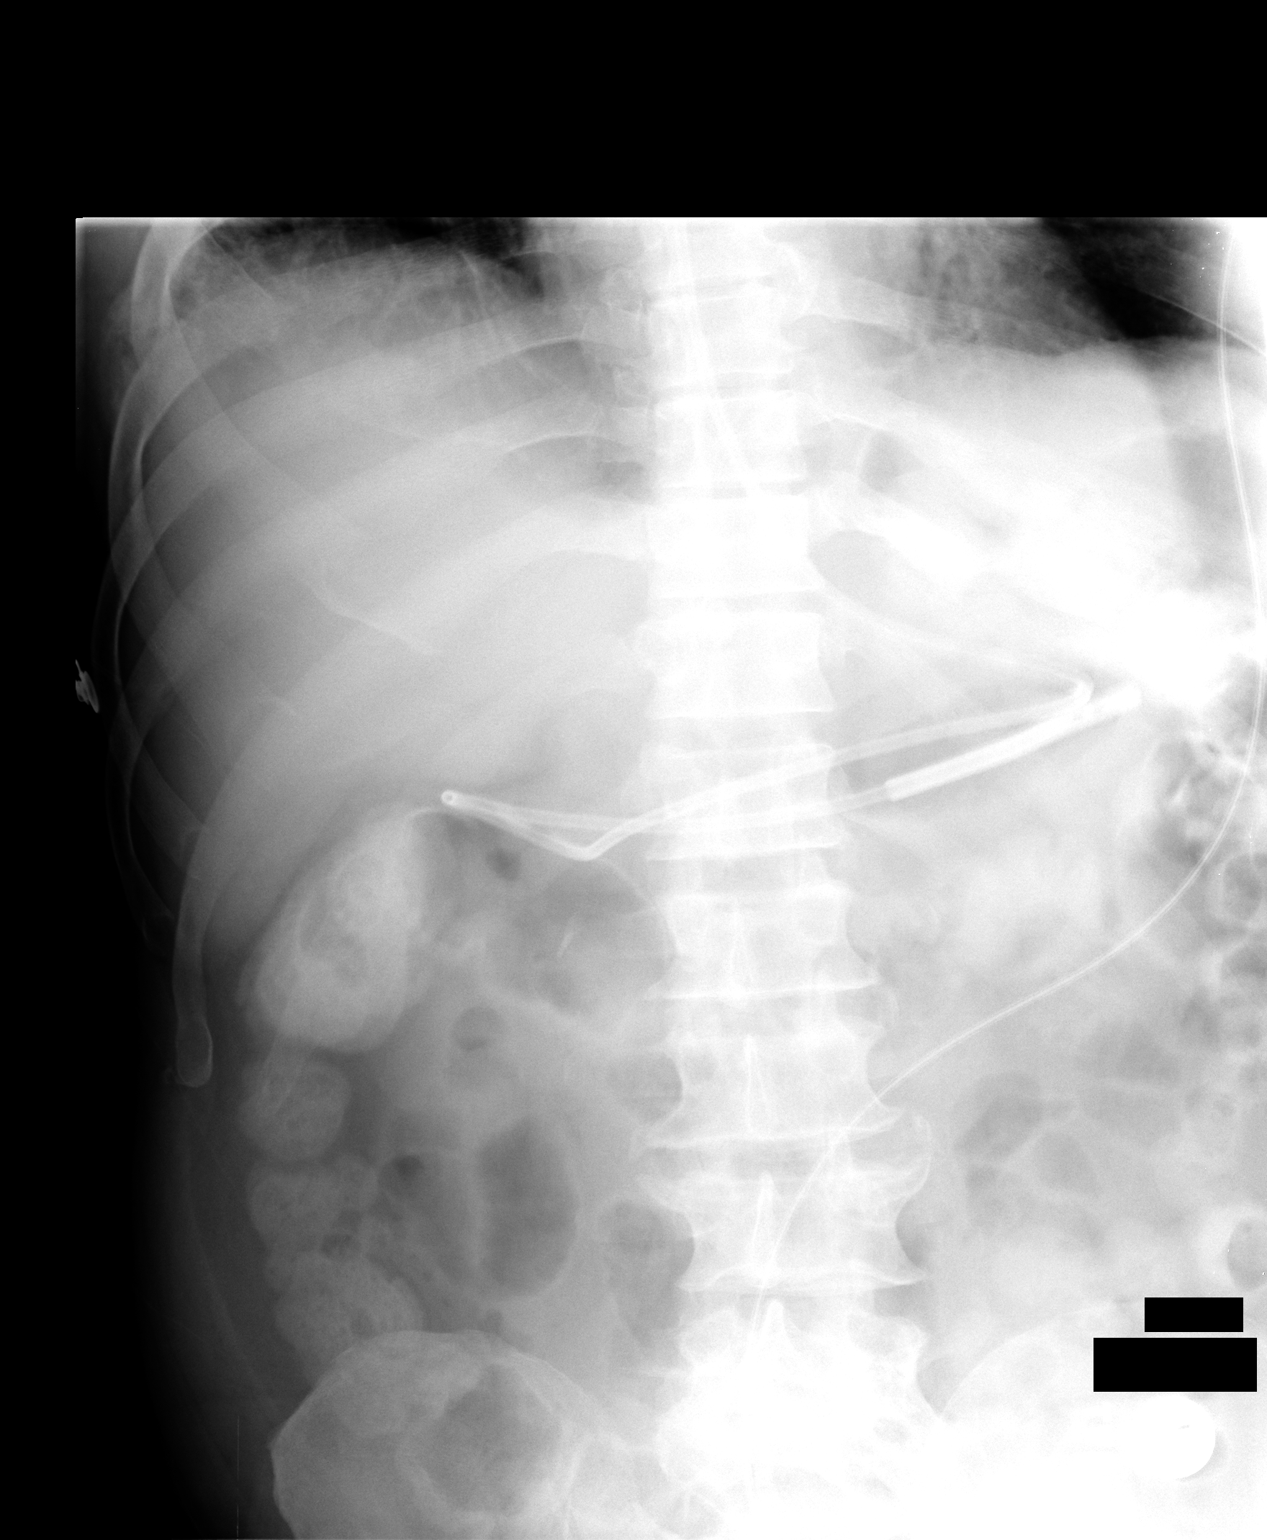

[1 of 1 positions shown; findings below may reference images not displayed]

FINDINGS: A Panda feeding tube is seen, which is looped in the stomach with the tip in the proximal to mid gastric body. No dilated bowel loops are seen.  Some residual contrast is noted within the colon.
IMPRESSION: Panda feeding tube looped in stomach with tip in proximal to mid gastric body.

## 2008-10-27 IMAGING — CR DG CHEST 1V PORT
1 series · 1 of 1 positions shown · non-contrast
Comparison: Portable chest x-ray 06/03/2006 and 06/01/2006.

CLINICAL DATA: Ventilator dependent respiratory failure. Postop right upper
lobectomy for lung mass.

PORTABLE CHEST - 1 VIEW  [DATE]/9331 3233 hours:

[view not recorded]
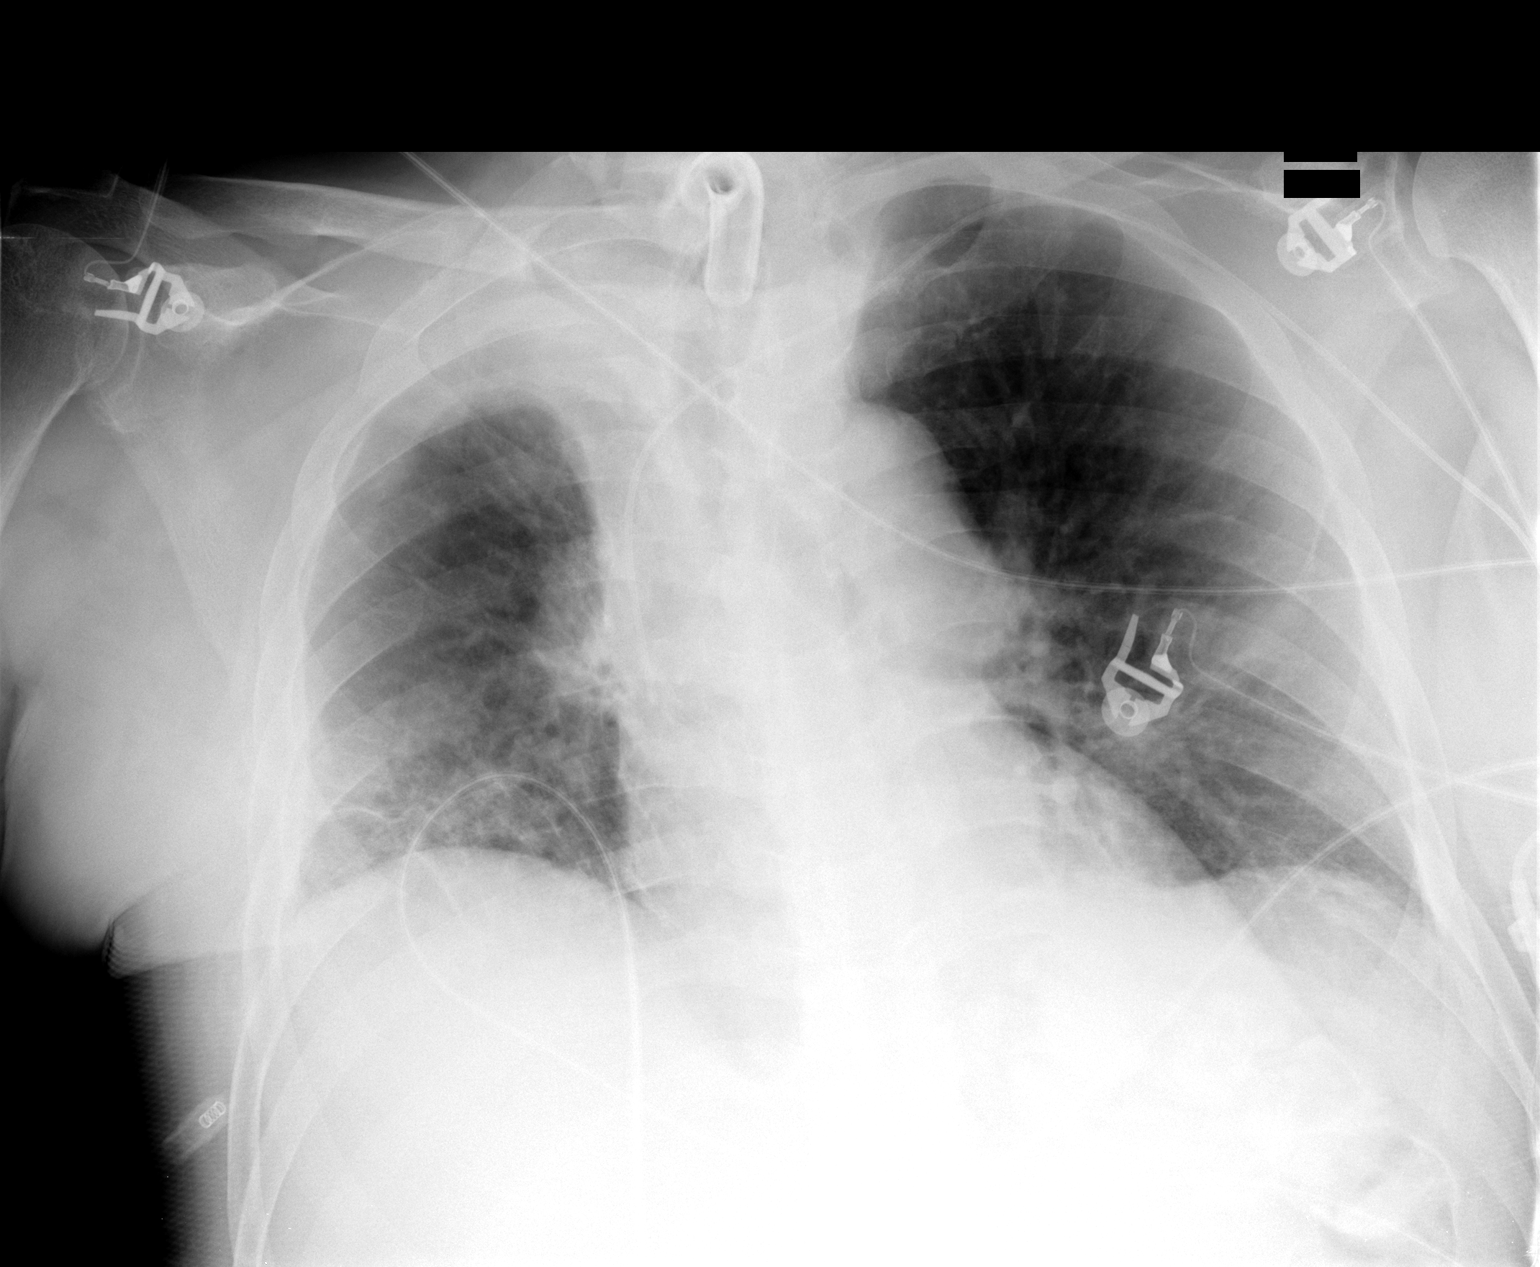

[1 of 1 positions shown; findings below may reference images not displayed]

FINDINGS: Postsurgical pleural and parenchymal scarring on the right are
unchanged. Atelectasis in the lower lobes related to a markedly suboptimal
inspiration is present, unchanged. No new pulmonary parenchymal abnormalities
are identified. The heart is enlarged but stable. Tracheostomy tube tip remains
in satisfactory position below the thoracic inlet. The left arm PICC tip remains
in the SVC.
IMPRESSION: Tubes and lines satisfactory. Bibasilar atelectasis related to suboptimal
inspiration, unchanged. No new abnormalities.

## 2008-10-27 IMAGING — CR DG CHEST 1V PORT
1 series · 1 of 1 positions shown · non-contrast
Comparison: none

HISTORY: Lung mass, PICC line placement

PORTAL CHEST ONE VIEW:
Tracheostomy tube tip above carina.
Left arm PICC line, tip SVC.
Low lung volumes and accentuated heart size.
Bibasilar atelectasis versus infiltrates.
Persistent opacity upper right hemithorax.

[view not recorded]
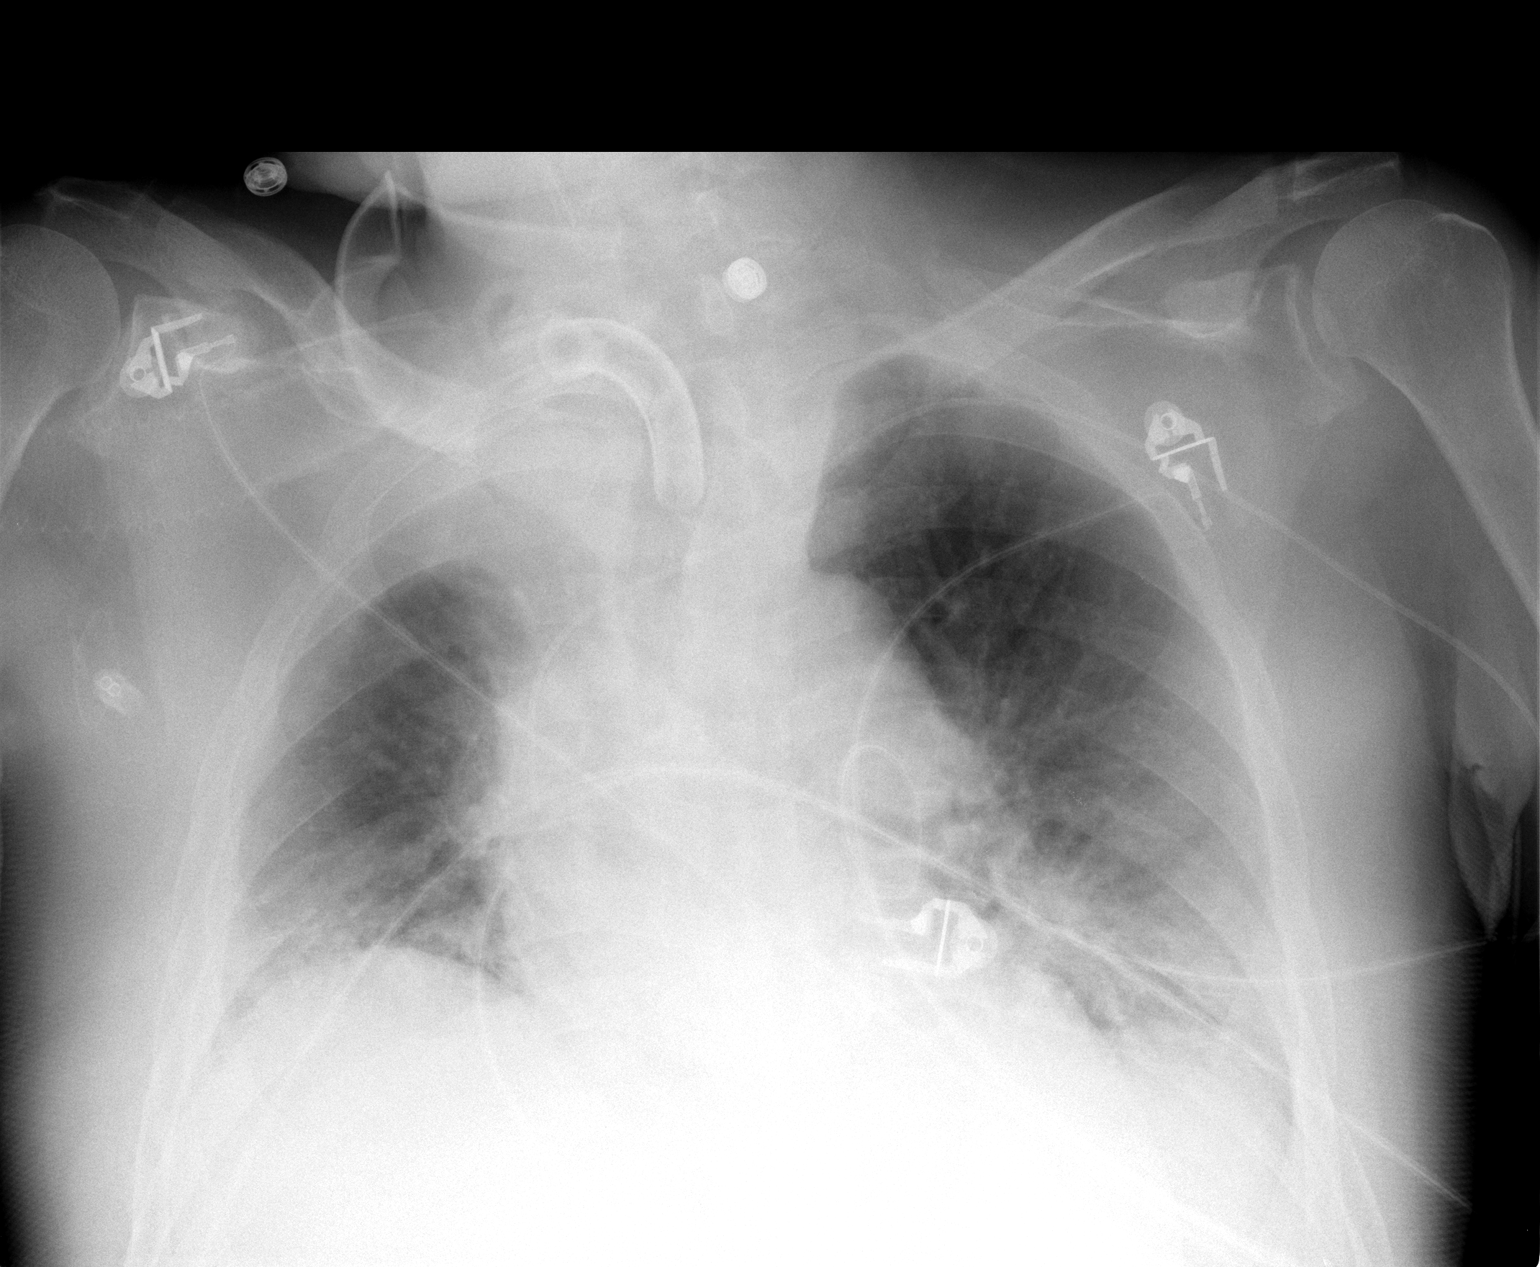

[1 of 1 positions shown; findings below may reference images not displayed]

IMPRESSION: Left arm PICC line, tip SVC.
Low lung volumes and bibasilar atelectasis versus infiltrate.
Persistent right apex opacity.

## 2008-10-28 IMAGING — CR DG CHEST 1V PORT
1 series · 1 of 1 positions shown · non-contrast
Comparison: none

CLINICAL DATA: Lung mass

Portable chest at 708:
Comparison 06/07/2006. Tracheostomy and left arm PICC stable. Right apical
opacity stable. Coarse bibasilar interstitial infiltrates or atelectasis. No
definite effusion. Heart size upper limits normal.

[view not recorded]
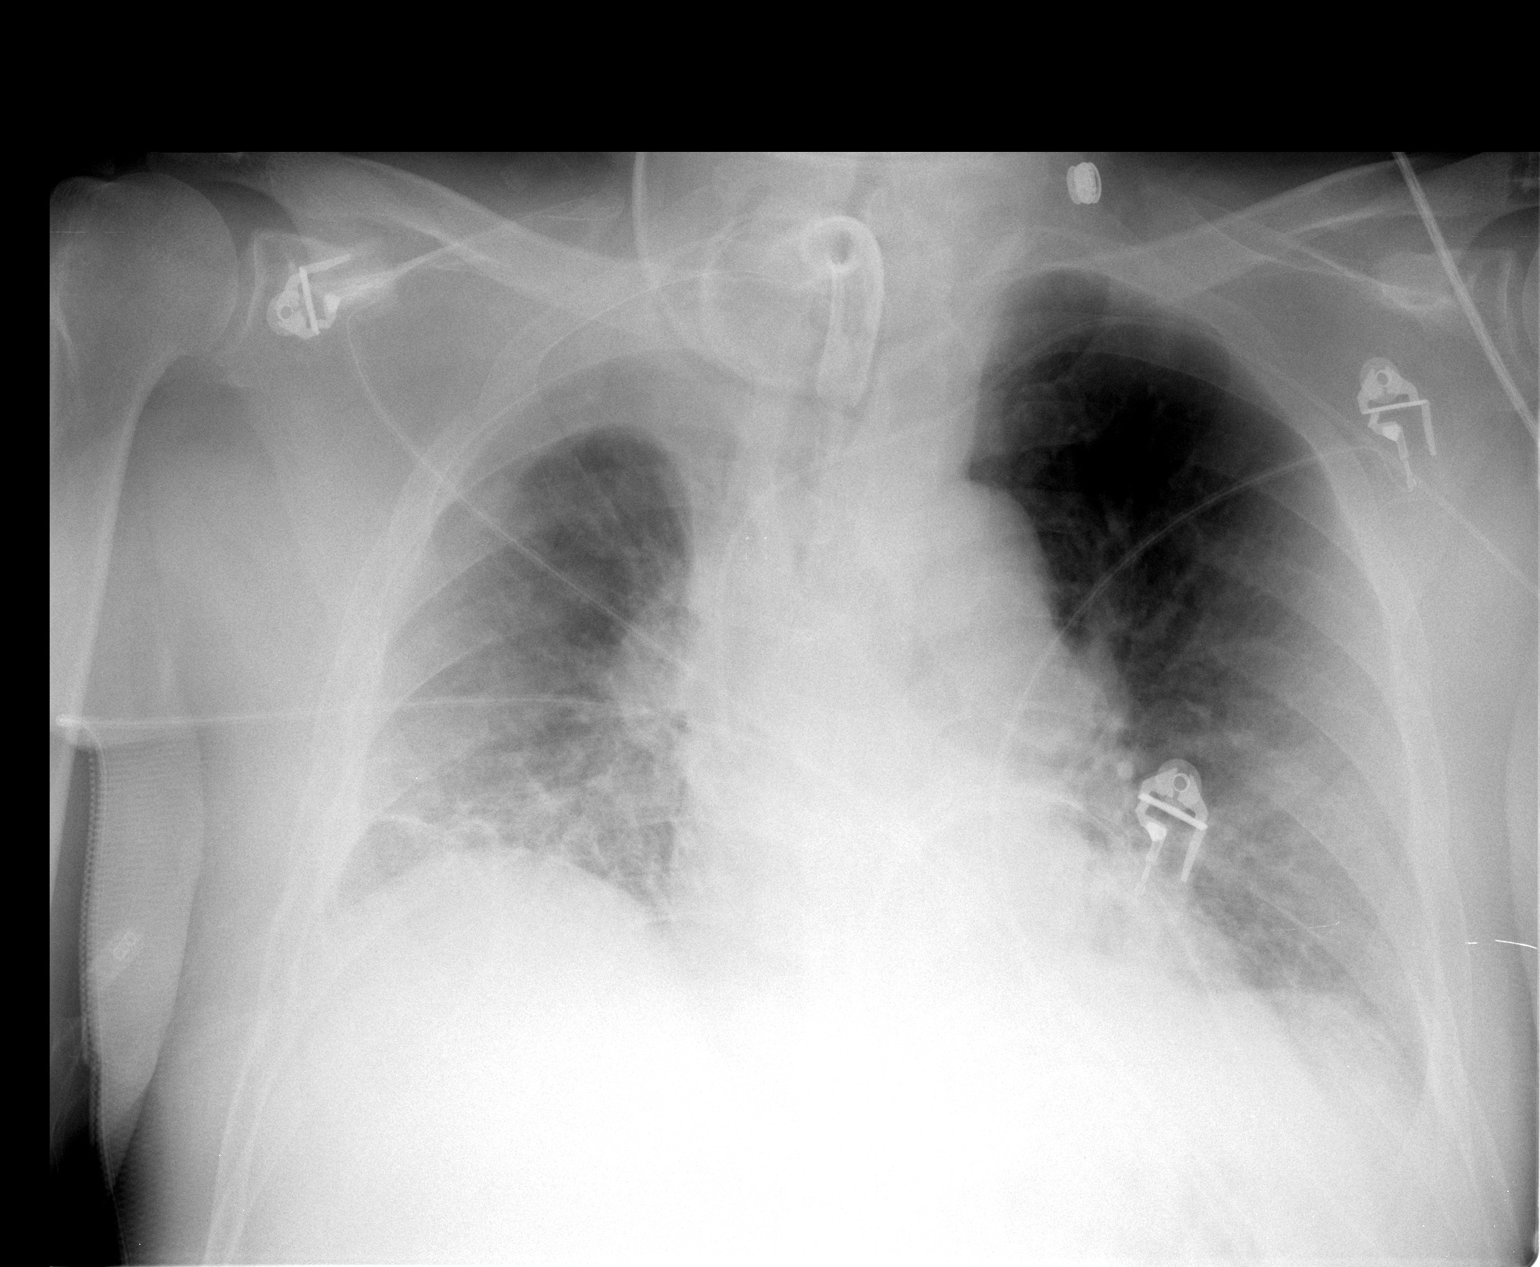

[1 of 1 positions shown; findings below may reference images not displayed]

IMPRESSION: 1. Stable appearance since previous day's exam

## 2008-11-01 IMAGING — CR DG CHEST 1V PORT
1 series · 1 of 1 positions shown · non-contrast
Comparison: 06/10/2006

CLINICAL DATA: Lung cancer. Pneumonia.

PORTABLE CHEST - 1 VIEW

[view not recorded]
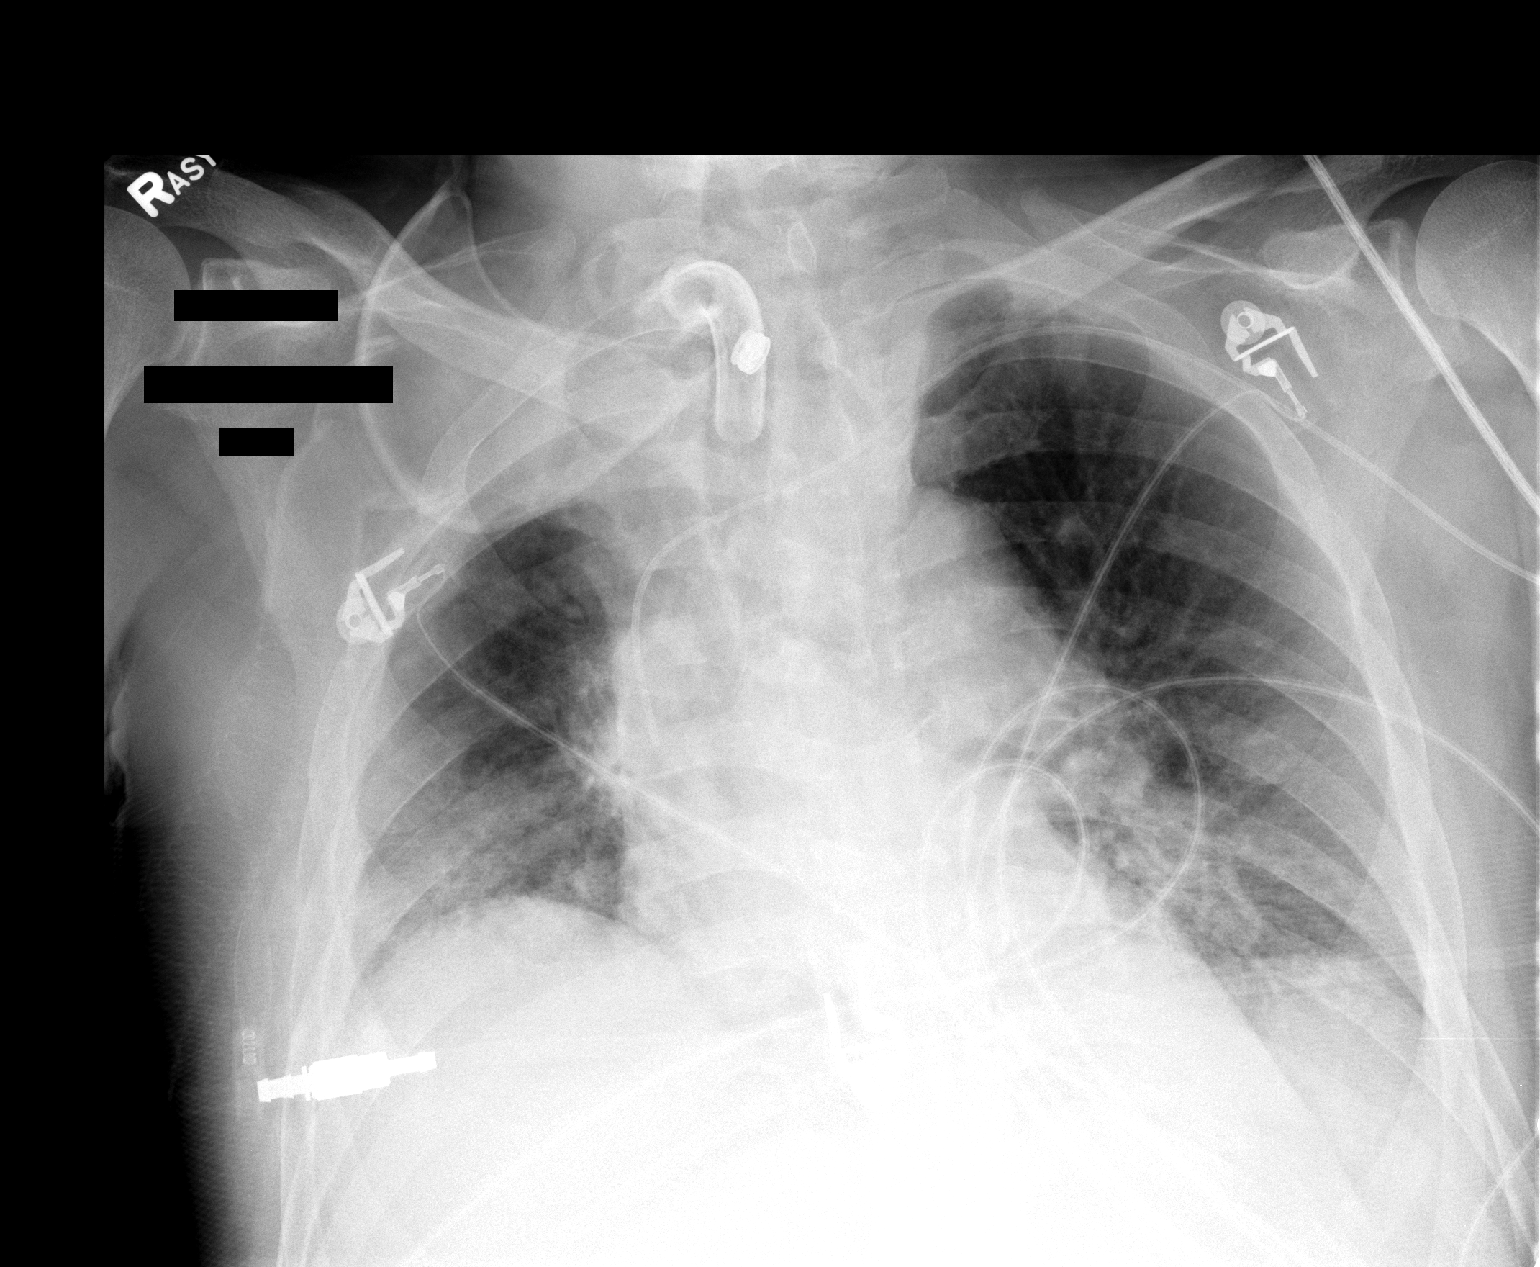

[1 of 1 positions shown; findings below may reference images not displayed]

FINDINGS: Tracheostomy tube and left-sided central line are again identified.
Low lung volumes noted. Pleural thickening at the right lung apex appears
stable.

There is continued interstitial opacity in the lungs, with bibasilar 
ill-defined airspace opacities. This could reflect mild edema or pneumonia.  

IMPRESSION

1. Faint interstitial prominence with confluent ill-defined airspace opacities
at the lung bases, raising the possibility of edema or pneumonia. These do not
appear significantly changed compared to prior exam.
2. Postoperative findings on the right side, with volume loss, likely prior
right upper lobectomy.

## 2008-11-24 ENCOUNTER — Telehealth (INDEPENDENT_AMBULATORY_CARE_PROVIDER_SITE_OTHER): Payer: Self-pay | Admitting: Internal Medicine

## 2008-12-05 IMAGING — CR DG CHEST 2V
2 series · 2 of 2 positions shown · non-contrast
Comparison: 06/12/06.

CLINICAL DATA: Chest pain.  Lung cancer surgery. 
 CHEST ? 2 VIEW:

[w chest pa]
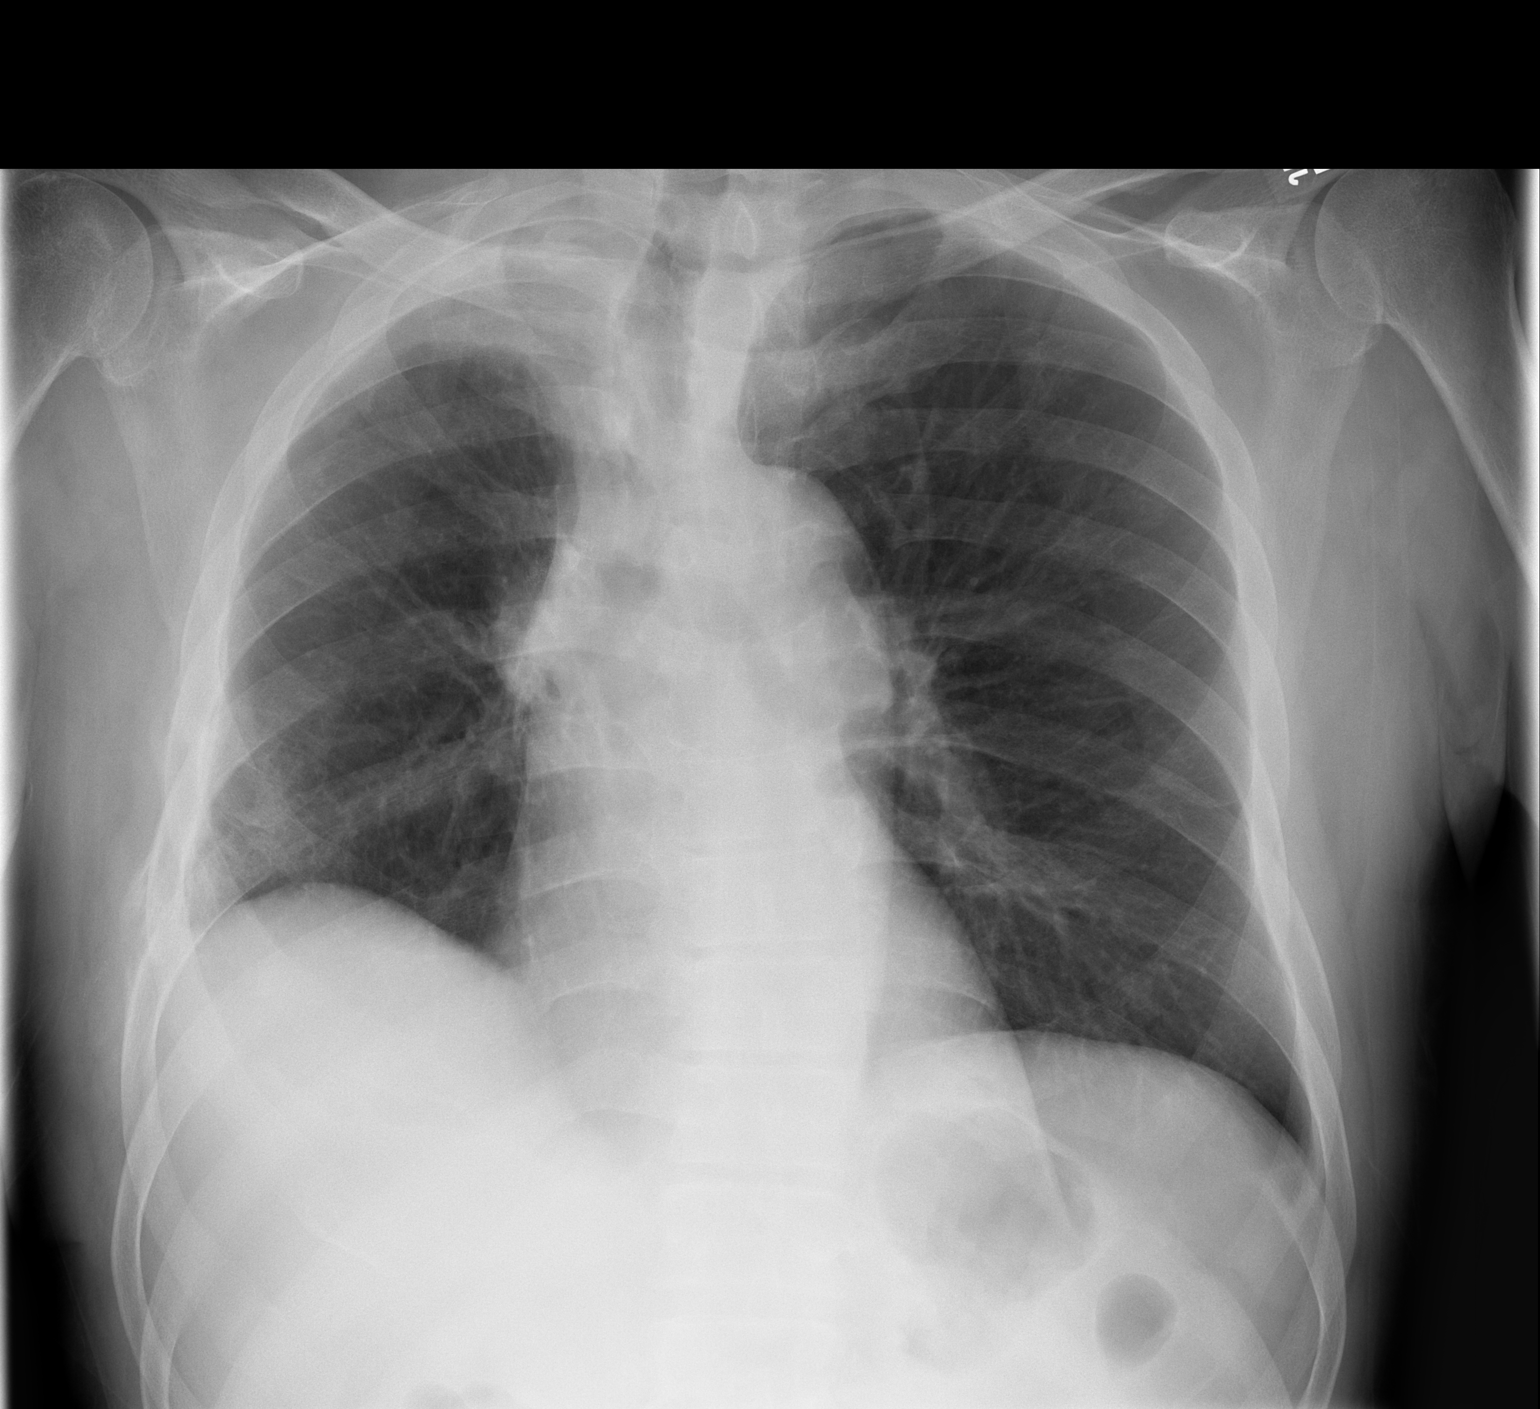

[w chest lat]
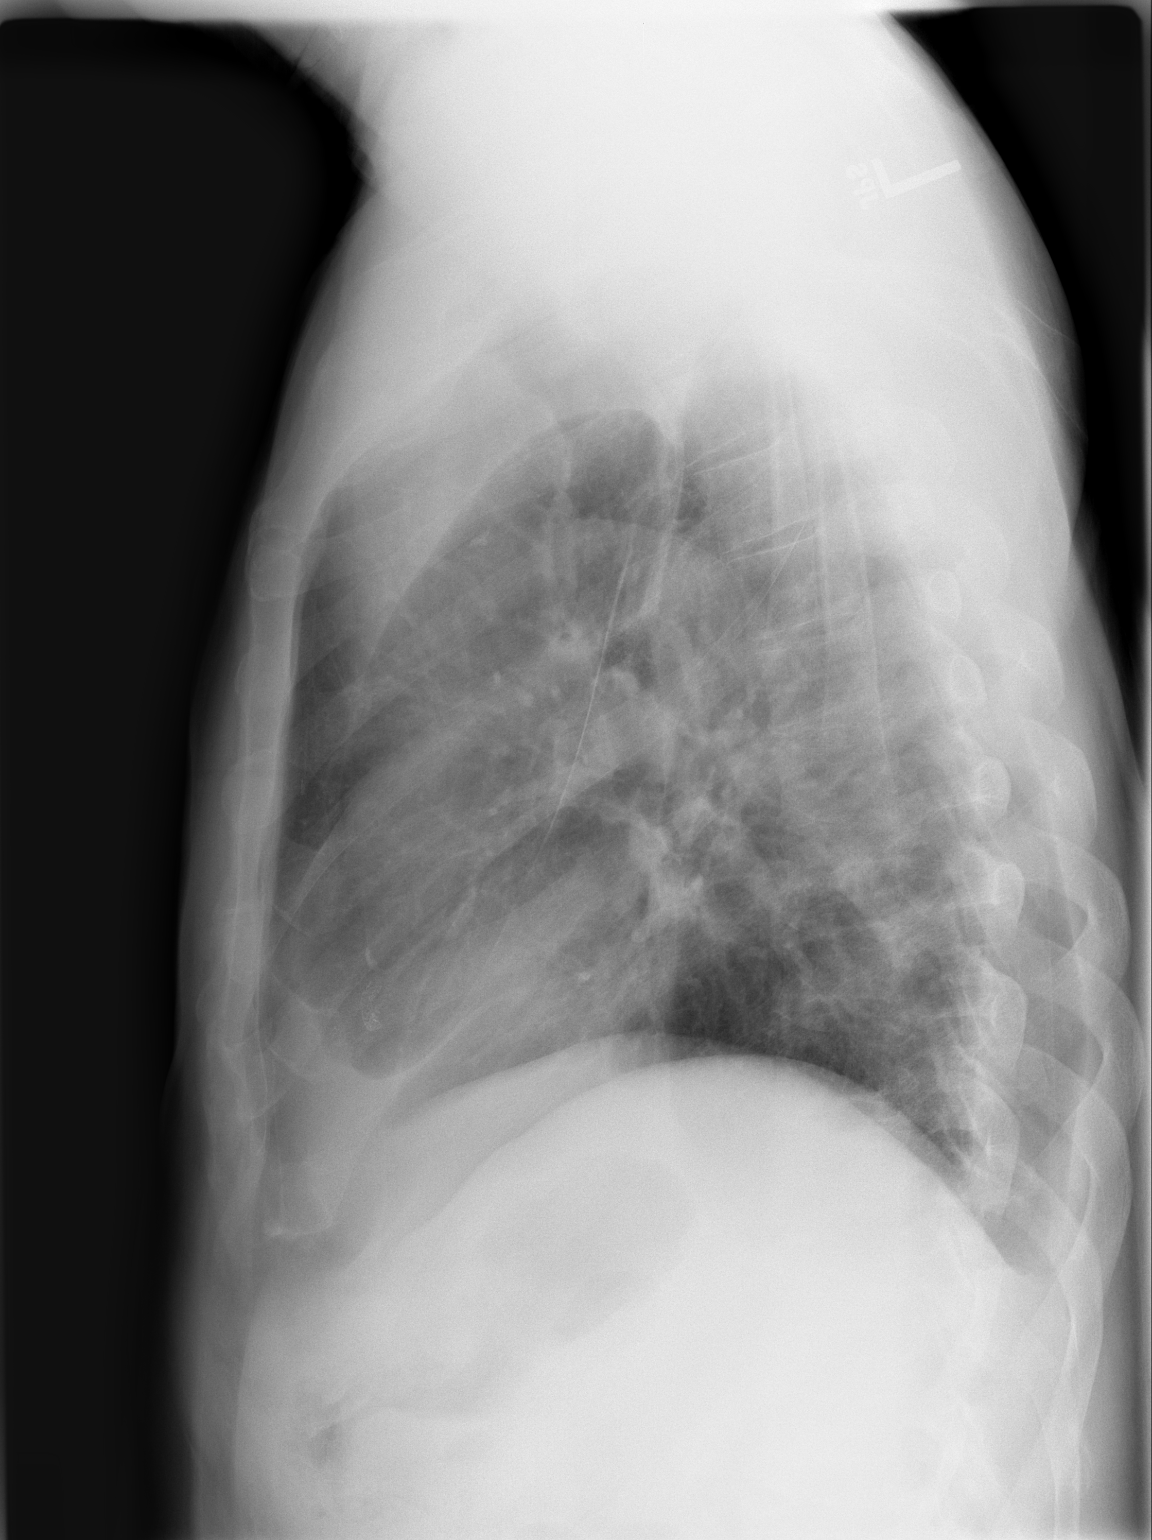

[2 of 2 positions shown; findings below may reference images not displayed]

FINDINGS: Heart size normal.  Volume loss and postoperative changes are seen in the right hemithorax.  Left lung clear.  No pleural fluid.
IMPRESSION: Postoperative changes and volume loss in the right hemithorax without acute finding.

## 2009-01-08 IMAGING — CR DG CHEST 2V
2 series · 2 of 2 positions shown · non-contrast
Comparison: 07/16/06.

CLINICAL DATA: Right lung cancer surgery in Tuesday November, 2005.  Shortness of breath.
 TWO VIEW CHEST:

[w chest pa]
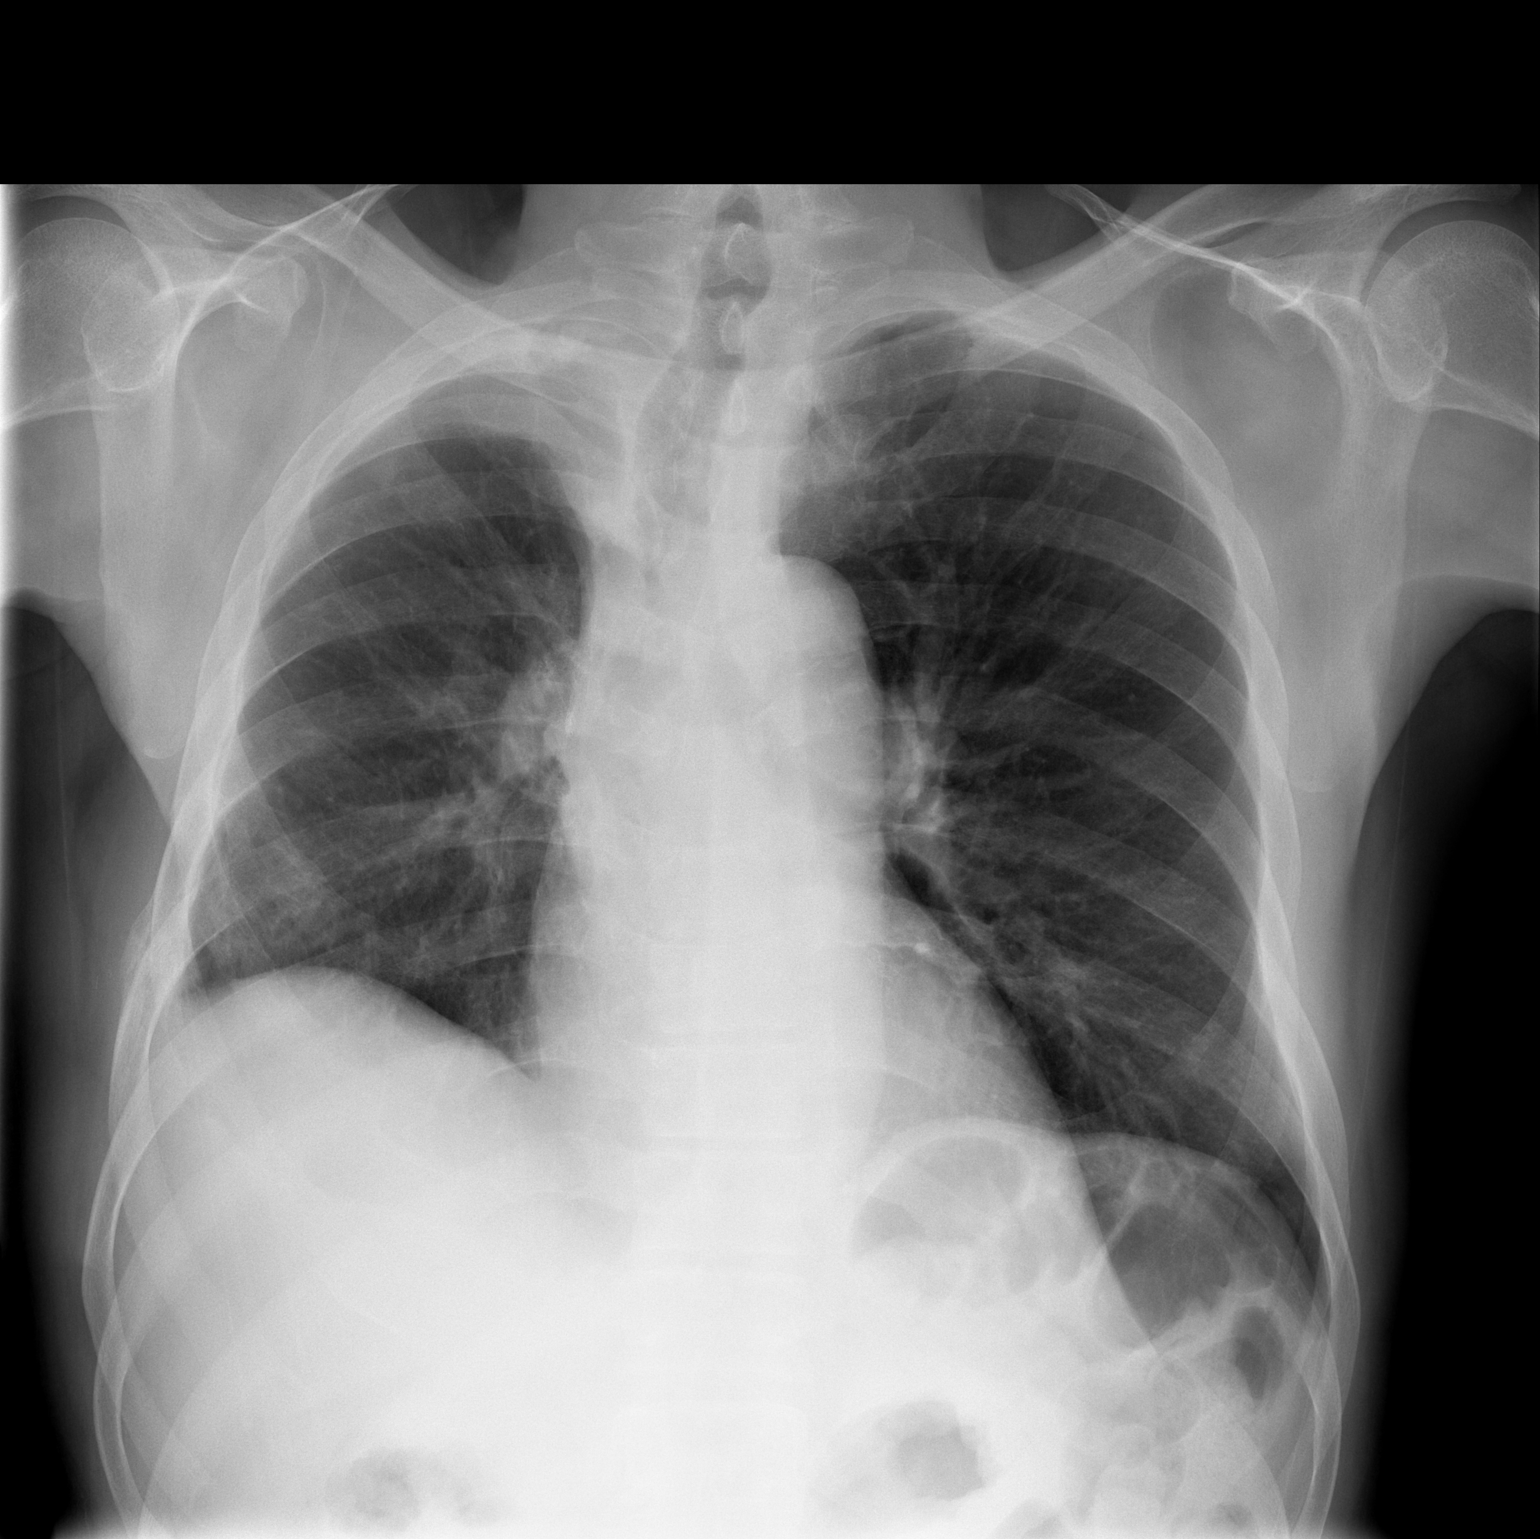

[w chest lat]
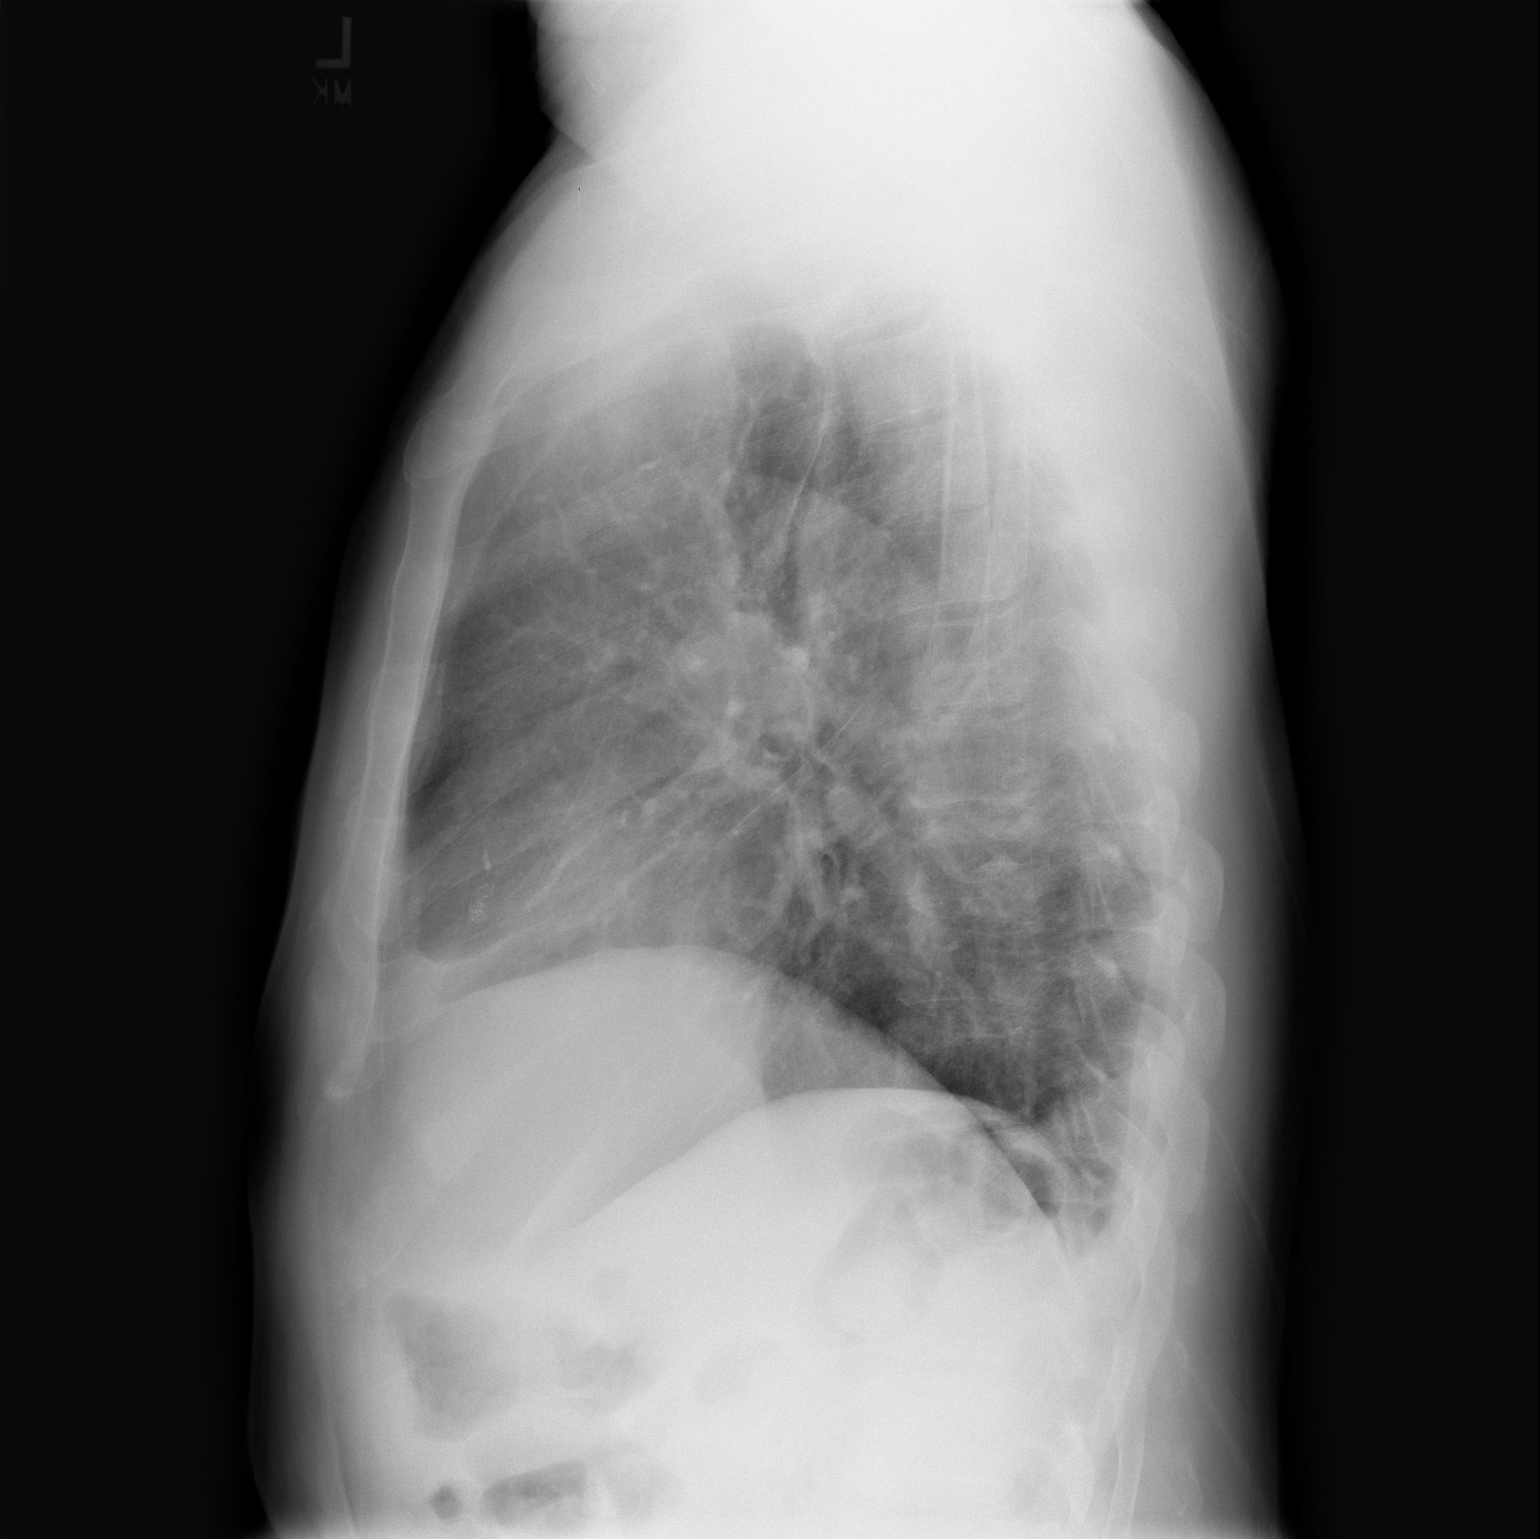

[2 of 2 positions shown; findings below may reference images not displayed]

Irregularity of the right sided ribs is likely postoperative.  Minimal tracheal deviation rightward.  Heart size normal.  The right hilum is borderline prominent but stable since the prior exam.  The left costophrenic angle is sharp.  There is right sided pleural thickening, both at the apex and at the right costophrenic angle.  The left lung is clear.  Vague opacity in the right costophrenic angle similar to on the prior exam likely relates to scarring.  Left hemidiaphragm is elevated.
IMPRESSION: Stable post surgical changes in the right hemithorax without acute disease.

## 2009-01-25 ENCOUNTER — Ambulatory Visit: Payer: Self-pay | Admitting: Internal Medicine

## 2009-01-27 ENCOUNTER — Ambulatory Visit (HOSPITAL_COMMUNITY): Admission: RE | Admit: 2009-01-27 | Discharge: 2009-01-27 | Payer: Self-pay | Admitting: Internal Medicine

## 2009-01-27 LAB — CBC WITH DIFFERENTIAL/PLATELET
BASO%: 0.3 % (ref 0.0–2.0)
EOS%: 3.8 % (ref 0.0–7.0)
LYMPH%: 26.7 % (ref 14.0–49.0)
MCH: 30.8 pg (ref 27.2–33.4)
MCHC: 33.5 g/dL (ref 32.0–36.0)
MONO#: 0.4 10*3/uL (ref 0.1–0.9)
Platelets: 161 10*3/uL (ref 140–400)
RBC: 4.2 10*6/uL (ref 4.20–5.82)
WBC: 6.7 10*3/uL (ref 4.0–10.3)
lymph#: 1.8 10*3/uL (ref 0.9–3.3)

## 2009-01-27 LAB — COMPREHENSIVE METABOLIC PANEL
ALT: 14 U/L (ref 0–53)
AST: 23 U/L (ref 0–37)
Alkaline Phosphatase: 72 U/L (ref 39–117)
CO2: 25 mEq/L (ref 19–32)
Creatinine, Ser: 1.46 mg/dL (ref 0.40–1.50)
Sodium: 139 mEq/L (ref 135–145)
Total Bilirubin: 0.6 mg/dL (ref 0.3–1.2)
Total Protein: 7.5 g/dL (ref 6.0–8.3)

## 2009-02-01 ENCOUNTER — Encounter: Payer: Self-pay | Admitting: Internal Medicine

## 2009-02-01 ENCOUNTER — Encounter: Payer: Self-pay | Admitting: Cardiology

## 2009-02-01 ENCOUNTER — Encounter (INDEPENDENT_AMBULATORY_CARE_PROVIDER_SITE_OTHER): Payer: Self-pay | Admitting: Internal Medicine

## 2009-02-07 ENCOUNTER — Ambulatory Visit: Payer: Self-pay | Admitting: Internal Medicine

## 2009-02-07 ENCOUNTER — Ambulatory Visit: Admission: RE | Admit: 2009-02-07 | Discharge: 2009-02-07 | Payer: Self-pay | Admitting: Internal Medicine

## 2009-02-07 LAB — CONVERTED CEMR LAB
CO2: 25 meq/L (ref 19–32)
Calcium: 9.2 mg/dL (ref 8.4–10.5)
Chloride: 104 meq/L (ref 96–112)
Glucose, Bld: 103 mg/dL — ABNORMAL HIGH (ref 70–99)
LDL Cholesterol: 104 mg/dL — ABNORMAL HIGH (ref 0–99)
Sodium: 140 meq/L (ref 135–145)
Total CHOL/HDL Ratio: 3.5
VLDL: 19 mg/dL (ref 0–40)

## 2009-02-13 ENCOUNTER — Ambulatory Visit (HOSPITAL_COMMUNITY): Admission: RE | Admit: 2009-02-13 | Discharge: 2009-02-13 | Payer: Self-pay | Admitting: Internal Medicine

## 2009-02-14 ENCOUNTER — Encounter (INDEPENDENT_AMBULATORY_CARE_PROVIDER_SITE_OTHER): Payer: Self-pay | Admitting: Internal Medicine

## 2009-03-13 ENCOUNTER — Ambulatory Visit: Payer: Self-pay | Admitting: Internal Medicine

## 2009-03-13 LAB — CONVERTED CEMR LAB

## 2009-03-27 ENCOUNTER — Encounter: Admission: RE | Admit: 2009-03-27 | Discharge: 2009-05-29 | Payer: Self-pay | Admitting: Internal Medicine

## 2009-03-30 ENCOUNTER — Encounter (INDEPENDENT_AMBULATORY_CARE_PROVIDER_SITE_OTHER): Payer: Self-pay | Admitting: Internal Medicine

## 2009-04-12 ENCOUNTER — Telehealth (INDEPENDENT_AMBULATORY_CARE_PROVIDER_SITE_OTHER): Payer: Self-pay | Admitting: *Deleted

## 2009-04-20 ENCOUNTER — Ambulatory Visit: Payer: Self-pay | Admitting: Internal Medicine

## 2009-05-12 ENCOUNTER — Telehealth (INDEPENDENT_AMBULATORY_CARE_PROVIDER_SITE_OTHER): Payer: Self-pay | Admitting: Internal Medicine

## 2009-05-31 IMAGING — CT CT ABDOMEN W/ CM
2 of 5 series · 16 of 46 positions shown, 18 images · IV contrast (omnipaque)
Comparison: plain film 08/19/2006. CT chest 05/24/2006. CT PET of 04/24/2006. No
prior abdominal imaging.

CLINICAL DATA: Lung cancer diagnosed in [REDACTED]. Right lobectomy and right
inguinal hernia.

CHEST CT WITH CONTRAST
TECHNIQUE: Multidetector CT imaging of the chest was performed following the
standard protocol during bolus administration of intravenous contrast.
Contrast:  100 cc Omnipaque 300
TECHNIQUE: Multidetector CT imaging of the abdomen was performed following the

[Series 2: cap 5.0 b40f · axial · 0.79mm/px · z∈[-392,+58]mm · 13 of 104 slices shown, 15 images]
[im 7/104  soft-tissue]
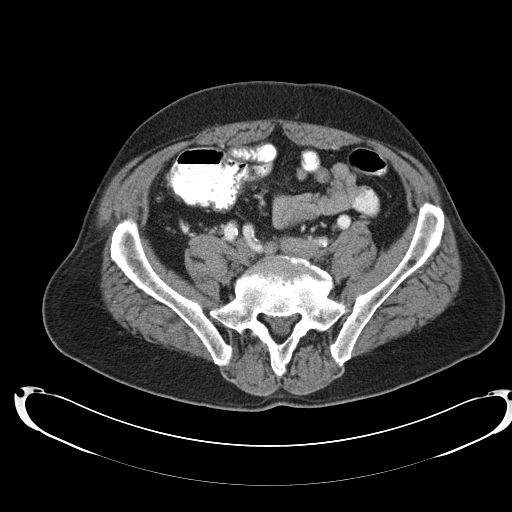
[im 7/104  bone]
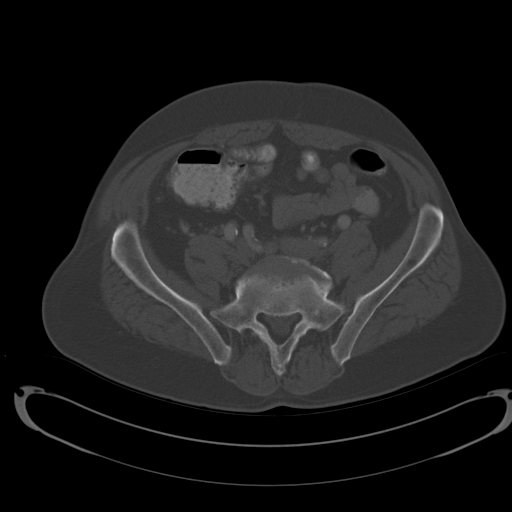
[im 14/104  soft-tissue]
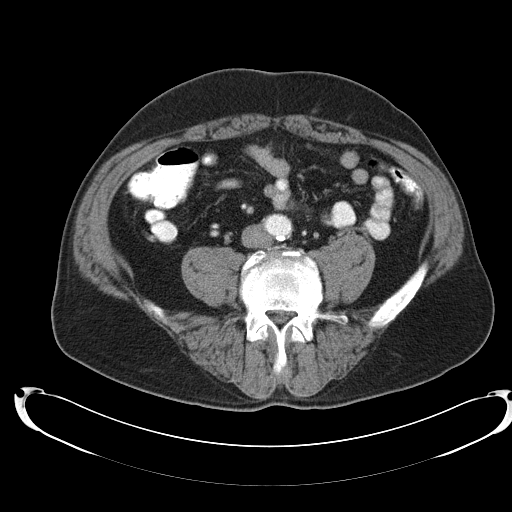
[im 21/104  soft-tissue]
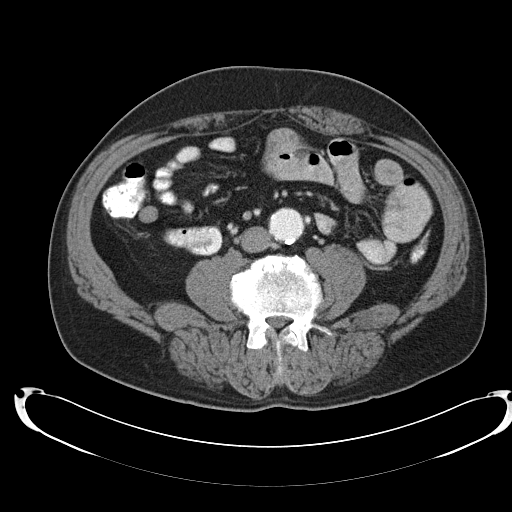
[im 28/104  soft-tissue]
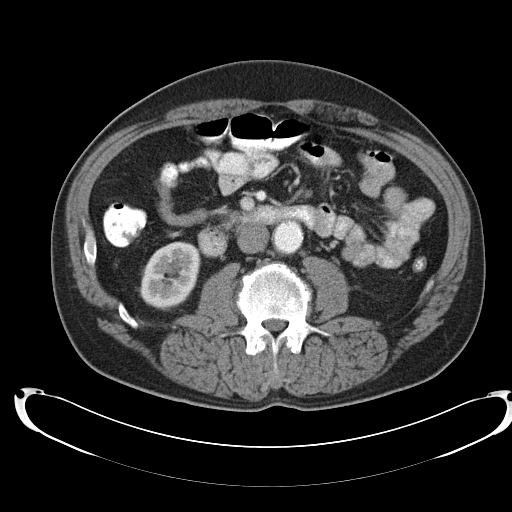
[im 35/104  soft-tissue]
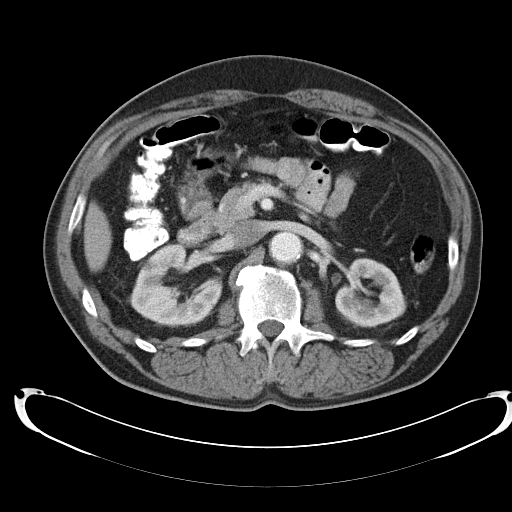
[im 42/104  soft-tissue]
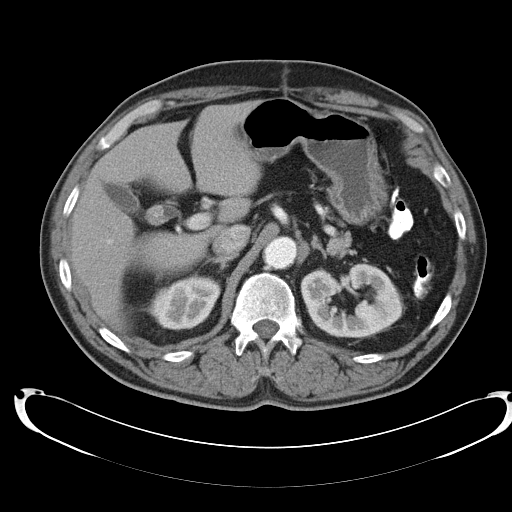
[im 55/104  soft-tissue]
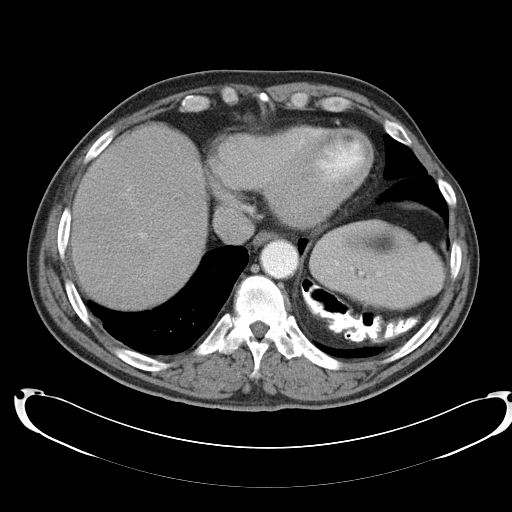
[im 62/104  soft-tissue]
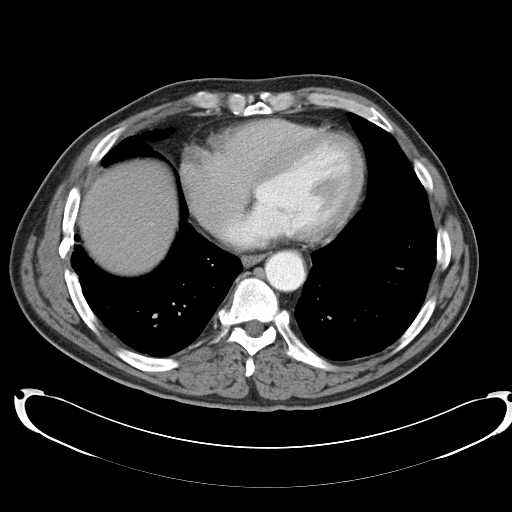
[im 69/104  soft-tissue]
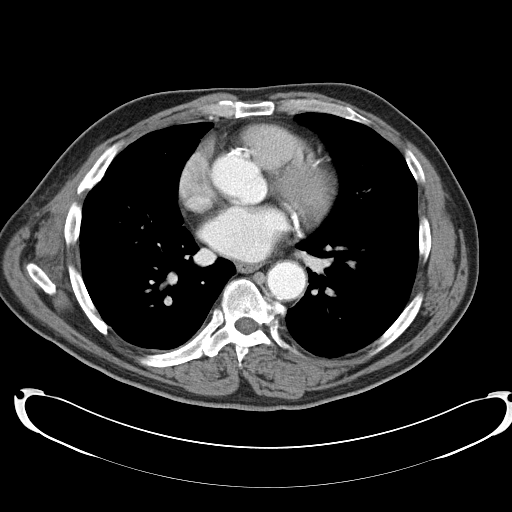
[im 69/104  bone]
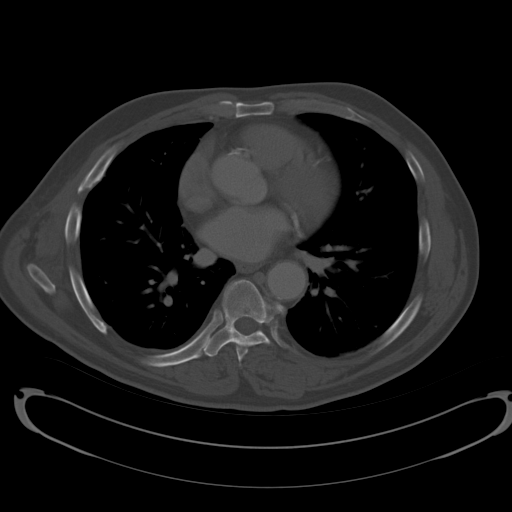
[im 76/104  soft-tissue]
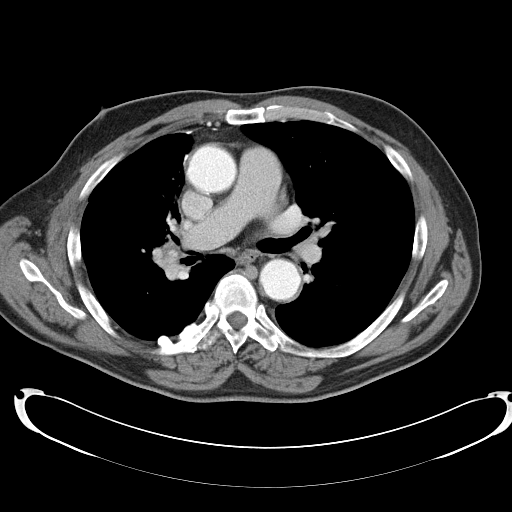
[im 83/104  soft-tissue]
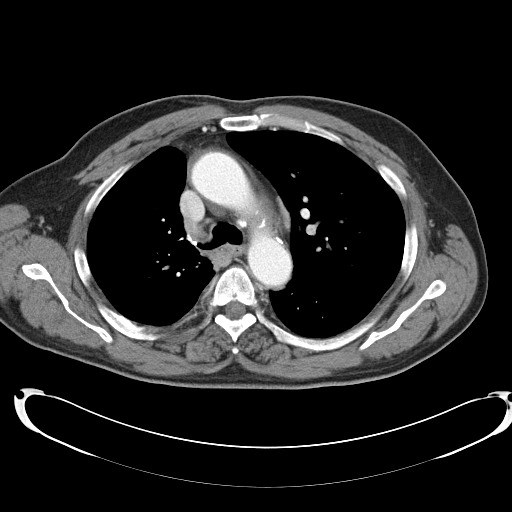
[im 90/104  soft-tissue]
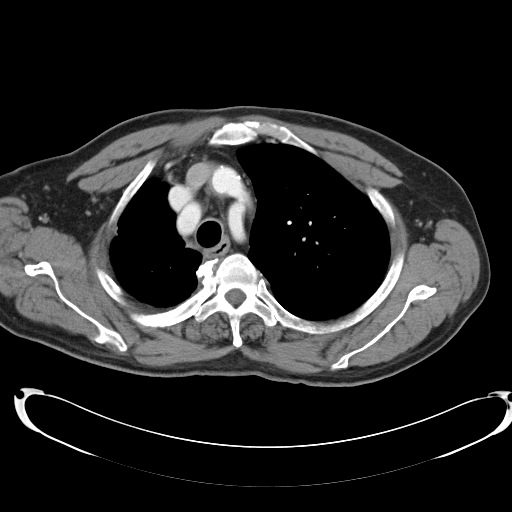
[im 97/104  soft-tissue]
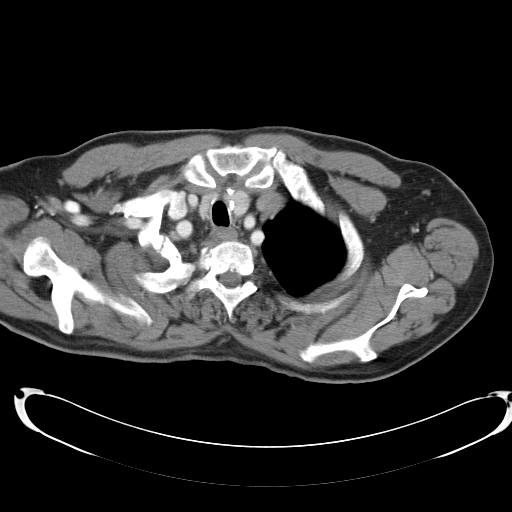

[Series 602: <mpr thick range> · coronal · 1.01mm/px · 3 of 79 slices shown]
[im 27/79  soft-tissue]
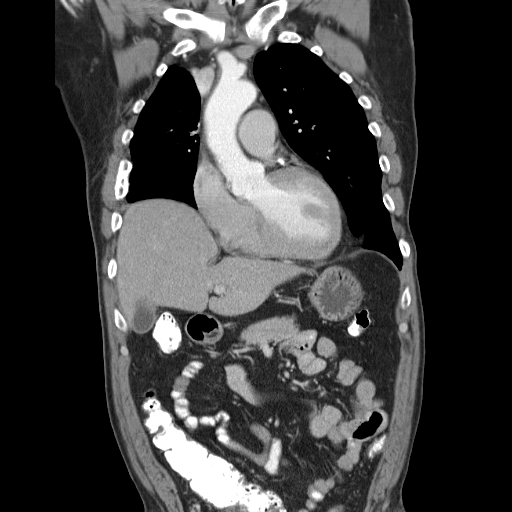
[im 35/79  soft-tissue]
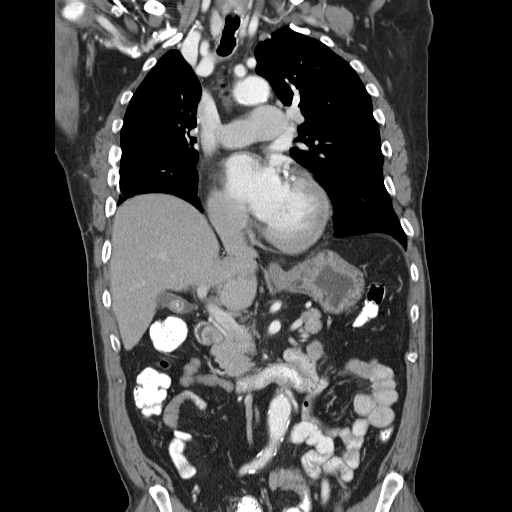
[im 44/79  soft-tissue]
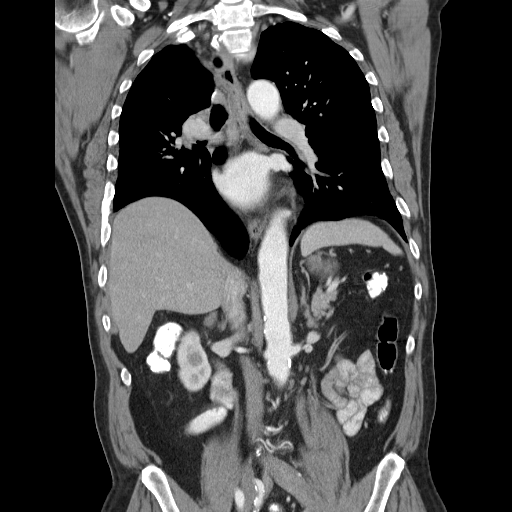

[16 of 46 positions shown; findings below may reference images not displayed]

FINDINGS: Lung windows demonstrate right upper lobectomy. Mild centrilobular
emphysema.

Soft tissue windows demonstrate normal heart size without pericardial or pleural
effusion. Age advanced coronary artery atherosclerosis.

Areas of right-sided pleural calcification which are likely treatment related.
No left-sided pleural calcifications to suggest alternative etiologies.

No mediastinal adenopathy. A right hilar node, just inferior to surgical sutures
measures approximately 1.0 x 1.3 cm image 24.

IMPRESSION

1. Status post right upper lobectomy without convincing evidence of metastatic
or recurrent disease.
2. A borderline enlarged right hilar lymph node, immediately adjacent to
surgical sutures. This could either be reevaluated on followup or more entirely
evaluated on CT PET if desired. .
3. New right pleural calcifications which are likely due to prior postoperative
hemothorax or empyema.
4. Age advanced coronary artery atherosclerosis. 

ABDOMEN CT WITH CONTRAST
FINDINGS: Tiny focus of subcapsular enhancement in the right liver lobe image
52 is likely present previously.

Normal spleen, stomach. Mildly atrophic pancreas.

Gallbladder incompletely distended. Soft tissue gallbladder neck likely related
to small stones or sludge. Slightly more apparent than on prior exam. No
evidence of acute cholecystitis.

Normal adrenal glands. Bilateral renal lesions which are likely cysts. Minimal
complexity in upper pole left renal lesion.
Mild atherosclerotic irregularity origin super mesenteric artery. Mild ectasia
of infrarenal abdominal aorta. No retroperitoneal retrocrural adenopathy.

Mildly heterogeneous pelvic Marrow density felt to be due to osteopenia.

Postoperative changes 2 right-sided ribs.

IMPRESSION

1. No evidence of metastatic disease within the abdomen. 
2. Soft tissue density in inferior gallbladder is likely due to small stones or
sludge. This is slightly more apparent than on prior exam. Attention on
followup. Ultrasound may be useful if there are right upper quadrant symptoms.
3. Tiny focus of hyper enhancement hepatic dome warrants attention on followup.

## 2009-06-07 ENCOUNTER — Telehealth (INDEPENDENT_AMBULATORY_CARE_PROVIDER_SITE_OTHER): Payer: Self-pay | Admitting: *Deleted

## 2009-06-29 ENCOUNTER — Ambulatory Visit: Payer: Self-pay | Admitting: Internal Medicine

## 2009-06-29 ENCOUNTER — Encounter (INDEPENDENT_AMBULATORY_CARE_PROVIDER_SITE_OTHER): Payer: Self-pay | Admitting: Internal Medicine

## 2009-06-30 ENCOUNTER — Encounter (INDEPENDENT_AMBULATORY_CARE_PROVIDER_SITE_OTHER): Payer: Self-pay | Admitting: Internal Medicine

## 2009-07-01 LAB — CONVERTED CEMR LAB
Amphetamine Screen, Ur: NEGATIVE
Calcium: 9.1 mg/dL (ref 8.4–10.5)
Cholesterol: 193 mg/dL (ref 0–200)
Creatinine, Ser: 1.26 mg/dL (ref 0.40–1.50)
HDL: 54 mg/dL (ref >39)
Marijuana Metabolite: NEGATIVE
Methadone: NEGATIVE
Opiates: NEGATIVE
Propoxyphene: NEGATIVE
Sodium: 139 meq/L (ref 135–145)
Total CHOL/HDL Ratio: 3.6
Triglycerides: 76 mg/dL (ref <150)

## 2009-07-13 ENCOUNTER — Ambulatory Visit: Payer: Self-pay | Admitting: Internal Medicine

## 2009-07-13 ENCOUNTER — Encounter (INDEPENDENT_AMBULATORY_CARE_PROVIDER_SITE_OTHER): Payer: Self-pay | Admitting: *Deleted

## 2009-07-27 ENCOUNTER — Ambulatory Visit: Payer: Self-pay | Admitting: Internal Medicine

## 2009-07-31 ENCOUNTER — Ambulatory Visit: Payer: Self-pay | Admitting: Internal Medicine

## 2009-08-01 ENCOUNTER — Encounter: Payer: Self-pay | Admitting: Internal Medicine

## 2009-08-01 ENCOUNTER — Ambulatory Visit (HOSPITAL_COMMUNITY): Admission: RE | Admit: 2009-08-01 | Discharge: 2009-08-01 | Payer: Self-pay | Admitting: Internal Medicine

## 2009-08-01 LAB — CBC WITH DIFFERENTIAL/PLATELET
Basophils Absolute: 0 10*3/uL (ref 0.0–0.1)
Eosinophils Absolute: 0.1 10*3/uL (ref 0.0–0.5)
HCT: 43 % (ref 38.4–49.9)
HGB: 14.2 g/dL (ref 13.0–17.1)
MCV: 94.3 fL (ref 79.3–98.0)
MONO%: 9.3 % (ref 0.0–14.0)
NEUT#: 3.3 10*3/uL (ref 1.5–6.5)
NEUT%: 66.1 % (ref 39.0–75.0)
RDW: 16 % — ABNORMAL HIGH (ref 11.0–14.6)

## 2009-08-01 LAB — COMPREHENSIVE METABOLIC PANEL
Albumin: 4 g/dL (ref 3.5–5.2)
Alkaline Phosphatase: 72 U/L (ref 39–117)
BUN: 20 mg/dL (ref 6–23)
Calcium: 8.8 mg/dL (ref 8.4–10.5)
Chloride: 110 mEq/L (ref 96–112)
Creatinine, Ser: 1.44 mg/dL (ref 0.40–1.50)
Glucose, Bld: 94 mg/dL (ref 70–99)
Potassium: 3.6 mEq/L (ref 3.5–5.3)

## 2009-08-03 ENCOUNTER — Encounter (INDEPENDENT_AMBULATORY_CARE_PROVIDER_SITE_OTHER): Payer: Self-pay | Admitting: Internal Medicine

## 2009-08-31 ENCOUNTER — Telehealth (INDEPENDENT_AMBULATORY_CARE_PROVIDER_SITE_OTHER): Payer: Self-pay | Admitting: Internal Medicine

## 2009-09-12 ENCOUNTER — Ambulatory Visit: Payer: Self-pay | Admitting: Thoracic Surgery (Cardiothoracic Vascular Surgery)

## 2009-10-22 IMAGING — CT CT CHEST W/ CM
2 of 4 series · 15 of 36 positions shown, 18 images · IV contrast (75CC OMNI 300)
Comparison: CT Chest of 01/09/07.

CLINICAL DATA: Lung carcinoma.  Surgery on the right in 4990.  Some shortness of breath.
 CT CHEST WITH CONTRAST:
TECHNIQUE: Multidetector CT imaging of the chest was performed following the standard protocol during bolus administration of intravenous contrast.
 Contrast:  75 cc Omnipaque 300

[Series 3: routine chest · axial · 0.70mm/px · z∈[-315,-25]mm · 12 of 68 slices shown, 15 images]
[im 5/68  mediastinal]
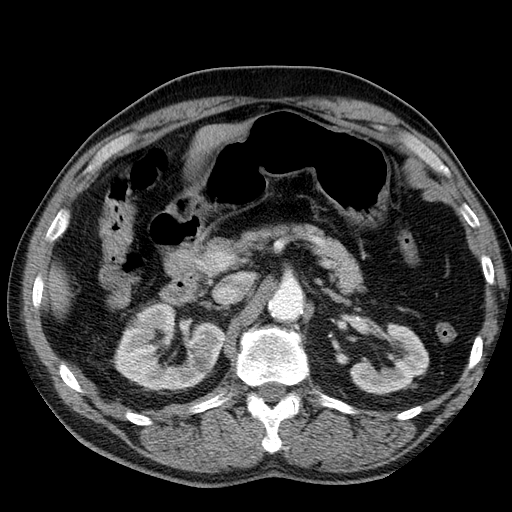
[im 5/68  lung]
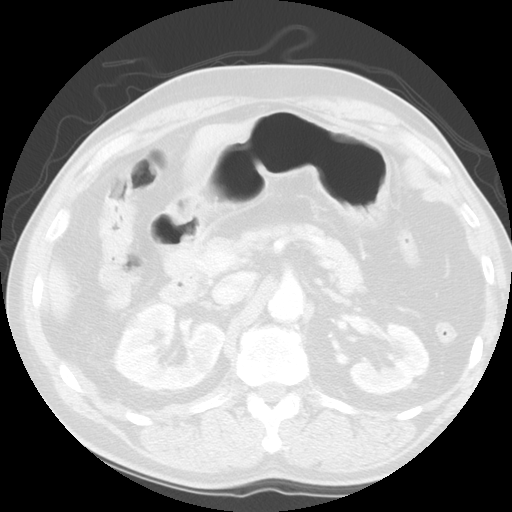
[im 10/68  lung]
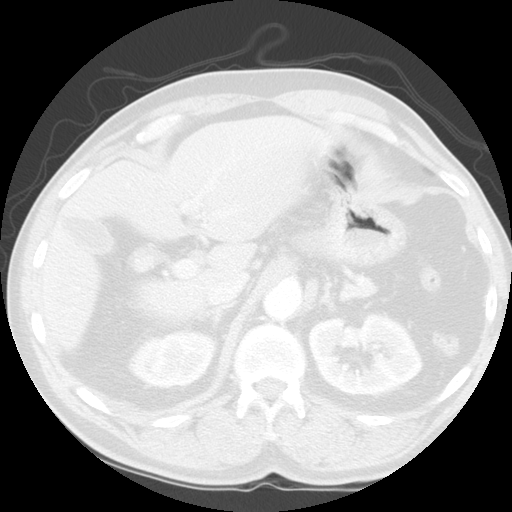
[im 15/68  lung]
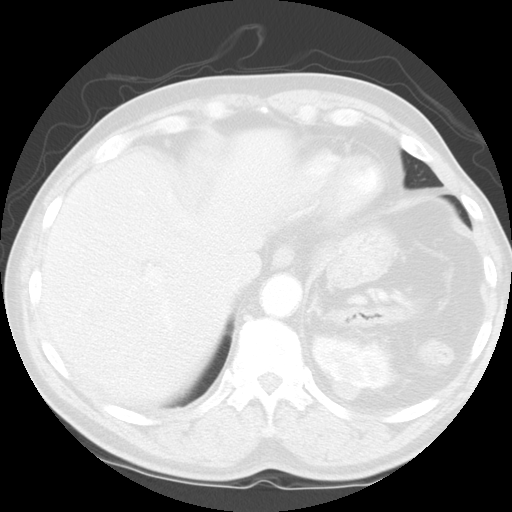
[im 20/68  lung]
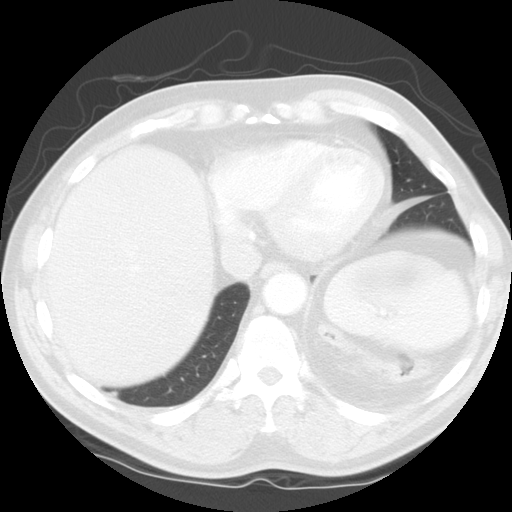
[im 24/68  mediastinal]
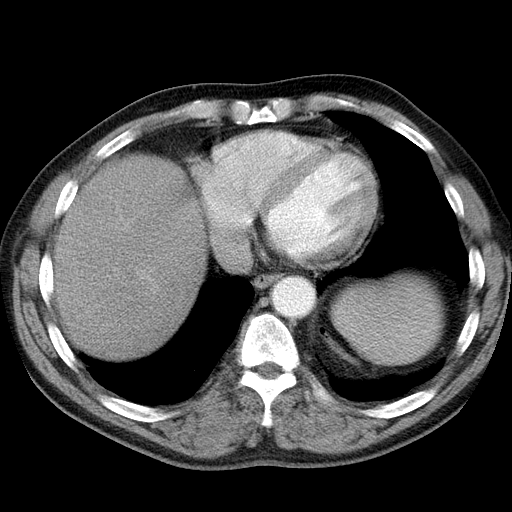
[im 24/68  lung]
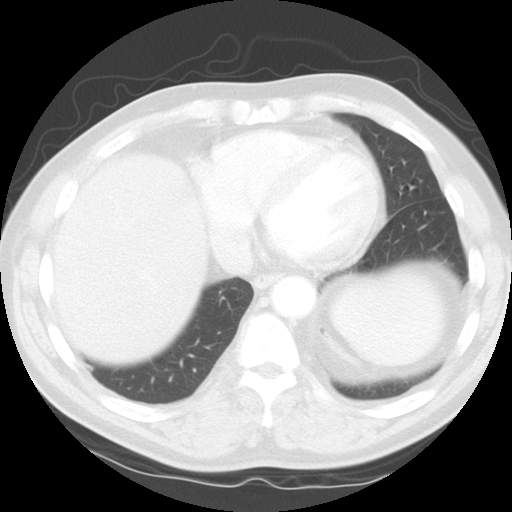
[im 29/68  lung]
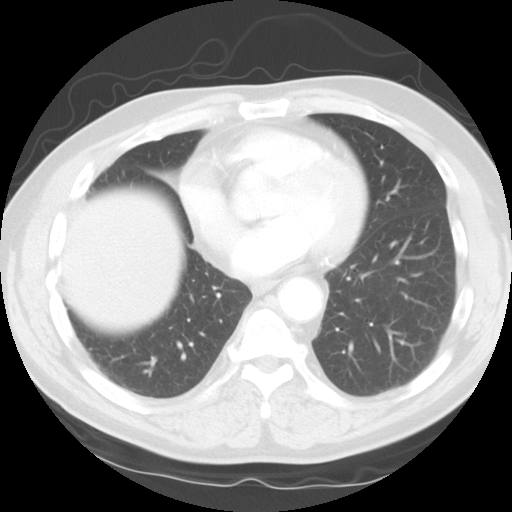
[im 39/68  lung]
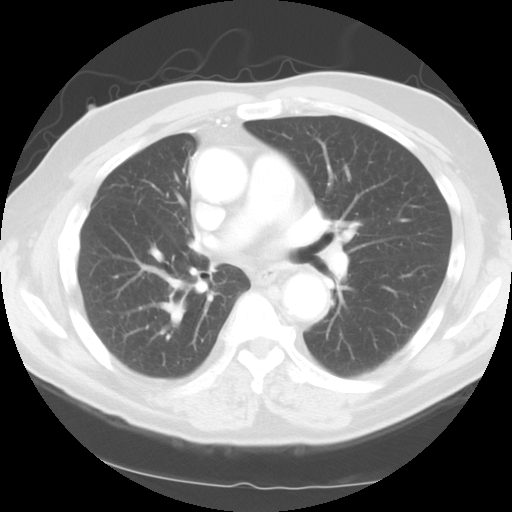
[im 44/68  lung]
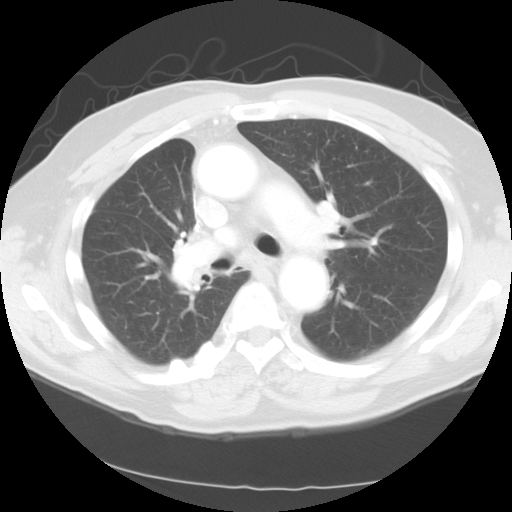
[im 48/68  mediastinal]
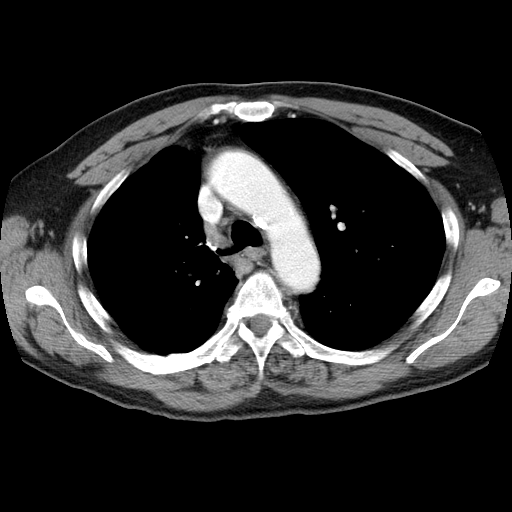
[im 48/68  lung]
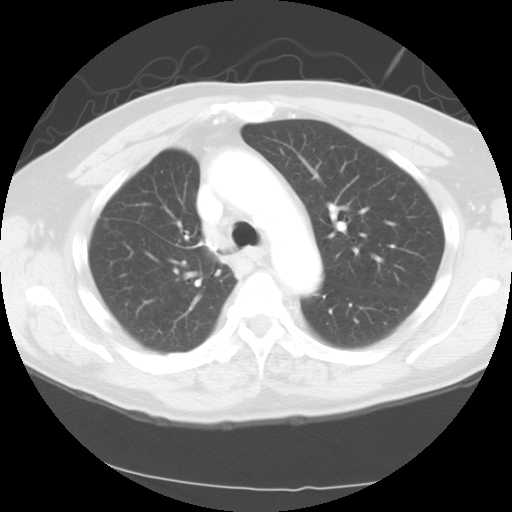
[im 53/68  lung]
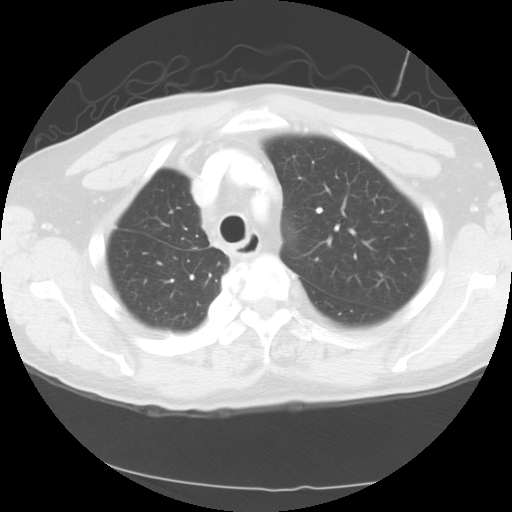
[im 58/68  lung]
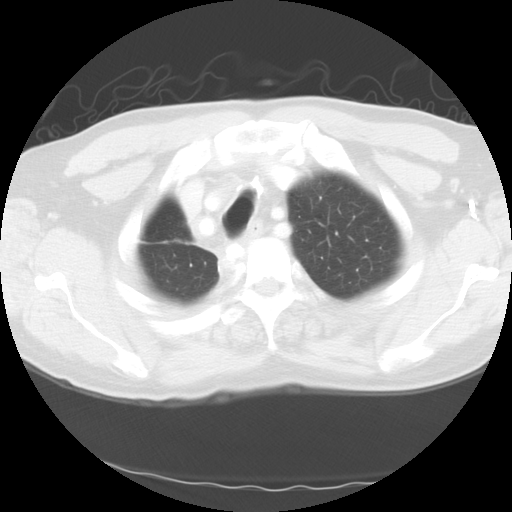
[im 63/68  lung]
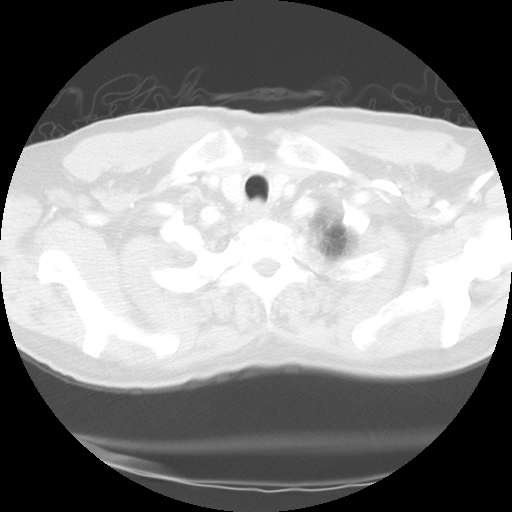

[Series 602: sagittal body · sagittal · 0.70mm/px · 3 of 145 slices shown]
[im 29/145  lung]
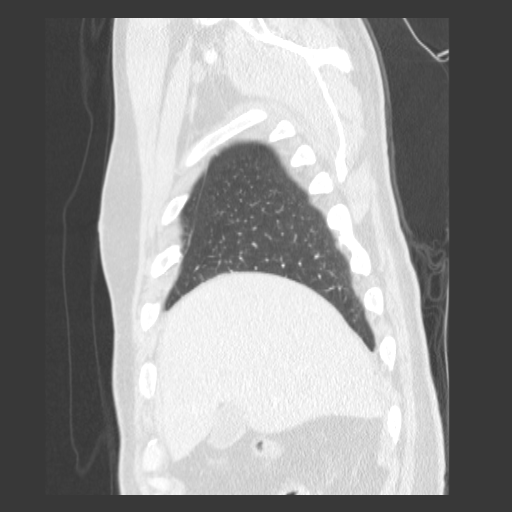
[im 58/145  lung]
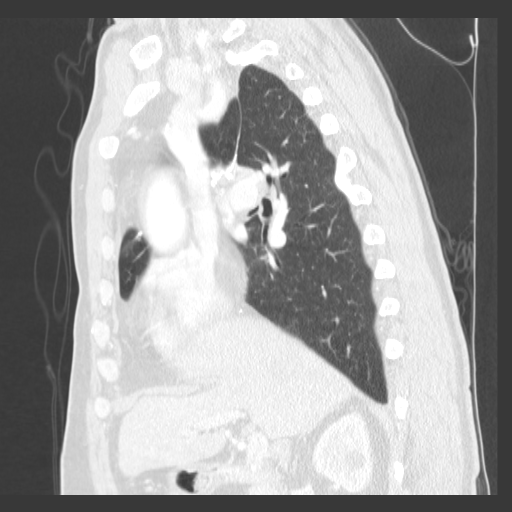
[im 87/145  lung]
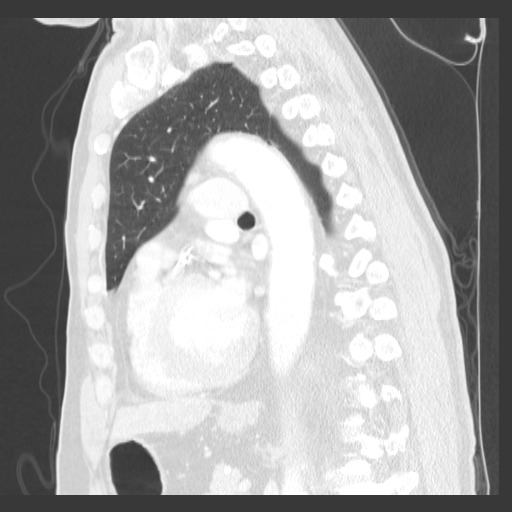

[15 of 36 positions shown; findings below may reference images not displayed]

FINDINGS: Post surgical changes in the right upper hemithorax are stable.  The lungs are well-aerated with no evidence of metastatic disease.  No pleural effusion is seen.  Post op changes on the right are noted with partial resection of several ribs and pleural calcifications as described previously.  Some scarring in the right suprahilar region is stable.  No mediastinal or hilar adenopathy is noted.  The pulmonary arteries and thoracic aorta opacify with no acute abnormality.  Coronary artery calcifications are noted.
IMPRESSION: 1.  Stable post op changes on the right.  No evidence of recurrence of lung carcinoma or metastatic disease. 
 2.  Coronary artery calcifications.

## 2009-10-30 ENCOUNTER — Telehealth (INDEPENDENT_AMBULATORY_CARE_PROVIDER_SITE_OTHER): Payer: Self-pay | Admitting: Internal Medicine

## 2009-11-14 ENCOUNTER — Telehealth: Payer: Self-pay | Admitting: Internal Medicine

## 2009-11-24 ENCOUNTER — Telehealth: Payer: Self-pay | Admitting: Internal Medicine

## 2009-12-08 ENCOUNTER — Encounter: Payer: Self-pay | Admitting: Internal Medicine

## 2010-01-01 ENCOUNTER — Telehealth: Payer: Self-pay | Admitting: Internal Medicine

## 2010-01-01 ENCOUNTER — Ambulatory Visit: Payer: Self-pay | Admitting: Internal Medicine

## 2010-01-01 LAB — CONVERTED CEMR LAB
Barbiturate Quant, Ur: NEGATIVE
Creatinine,U: 147.5 mg/dL
Marijuana Metabolite: NEGATIVE
Opiates: POSITIVE — AB
Propoxyphene: NEGATIVE

## 2010-01-25 ENCOUNTER — Ambulatory Visit: Payer: Self-pay | Admitting: Internal Medicine

## 2010-01-29 ENCOUNTER — Ambulatory Visit (HOSPITAL_COMMUNITY)
Admission: RE | Admit: 2010-01-29 | Discharge: 2010-01-29 | Payer: Self-pay | Admitting: Thoracic Surgery (Cardiothoracic Vascular Surgery)

## 2010-01-29 LAB — COMPREHENSIVE METABOLIC PANEL
AST: 24 U/L (ref 0–37)
Albumin: 3.9 g/dL (ref 3.5–5.2)
Alkaline Phosphatase: 75 U/L (ref 39–117)
BUN: 16 mg/dL (ref 6–23)
Creatinine, Ser: 1.32 mg/dL (ref 0.40–1.50)
Glucose, Bld: 107 mg/dL — ABNORMAL HIGH (ref 70–99)
Total Bilirubin: 0.6 mg/dL (ref 0.3–1.2)

## 2010-01-29 LAB — CBC WITH DIFFERENTIAL/PLATELET
BASO%: 0.3 % (ref 0.0–2.0)
Basophils Absolute: 0 10*3/uL (ref 0.0–0.1)
EOS%: 2.2 % (ref 0.0–7.0)
HCT: 42 % (ref 38.4–49.9)
HGB: 13.8 g/dL (ref 13.0–17.1)
LYMPH%: 26.3 % (ref 14.0–49.0)
MCH: 30.6 pg (ref 27.2–33.4)
MCHC: 32.9 g/dL (ref 32.0–36.0)
MCV: 93.1 fL (ref 79.3–98.0)
MONO%: 6.1 % (ref 0.0–14.0)
NEUT%: 65.1 % (ref 39.0–75.0)

## 2010-01-30 ENCOUNTER — Telehealth: Payer: Self-pay | Admitting: *Deleted

## 2010-01-31 ENCOUNTER — Encounter: Payer: Self-pay | Admitting: Internal Medicine

## 2010-02-25 IMAGING — CT CT CHEST W/ CM
2 of 3 series · 15 of 36 positions shown, 18 images · IV contrast (75CC OMNI 300)
Comparison: 06/02/2007.  01/09/2007.

CLINICAL DATA: HISTORY OF RIGHT-SIDED LUNG CANCER.  RESECTION
[DATE].  QUESTION RECURRENCE.

CHEST CT WITHOUT CONTRAST
TECHNIQUE: Multidetector CT imaging of the chest was performed
following the standard protocol without IV contrast.

[Series 3: routine chest · axial · 0.70mm/px · z∈[-358,-68]mm · 12 of 68 slices shown, 15 images]
[im 5/68  mediastinal]
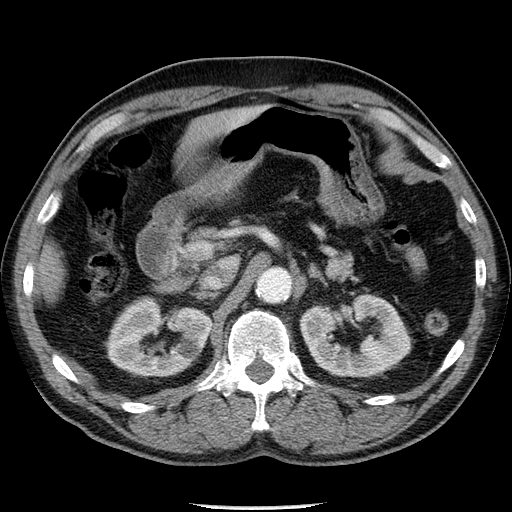
[im 5/68  lung]
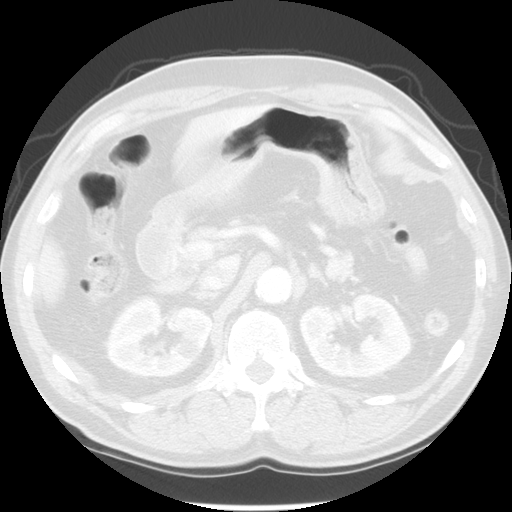
[im 10/68  lung]
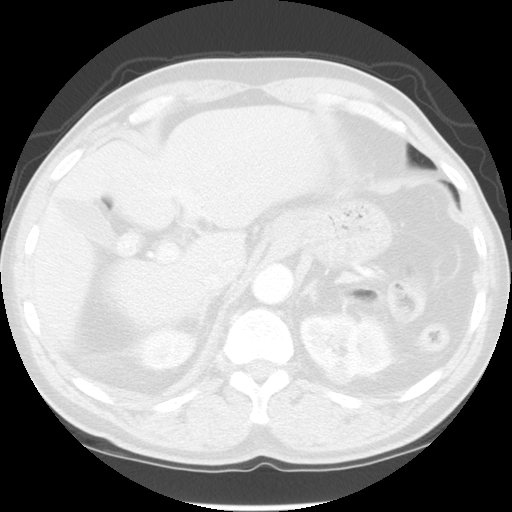
[im 15/68  lung]
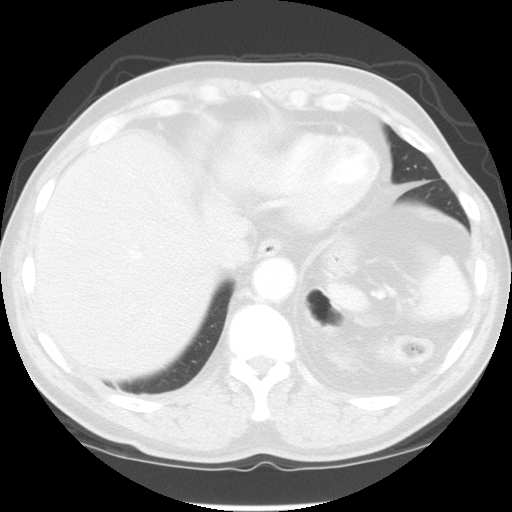
[im 20/68  lung]
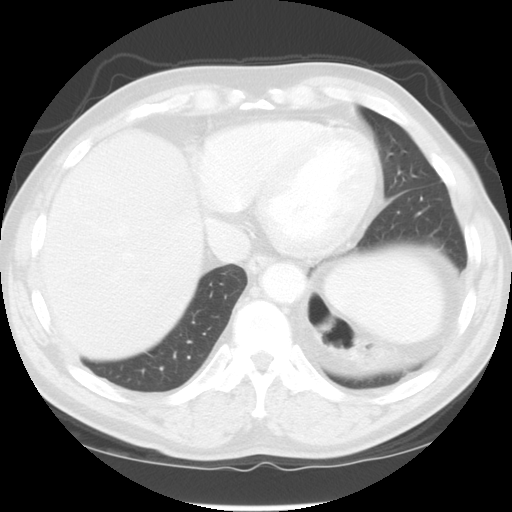
[im 25/68  mediastinal]
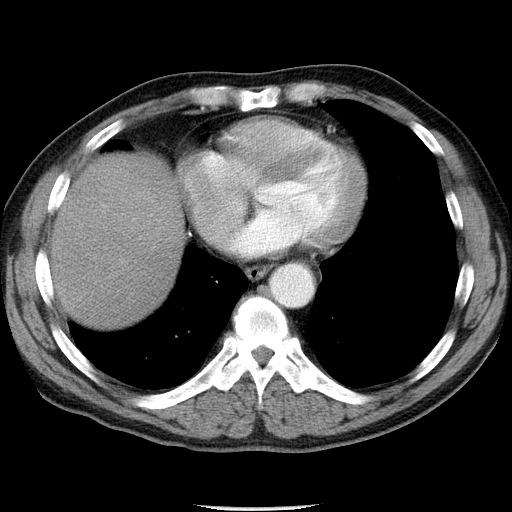
[im 25/68  lung]
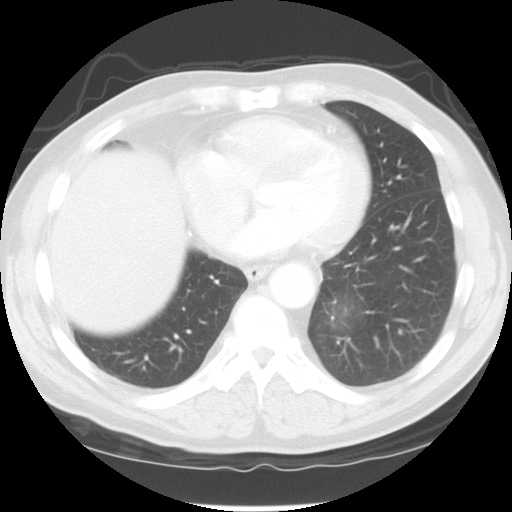
[im 30/68  lung]
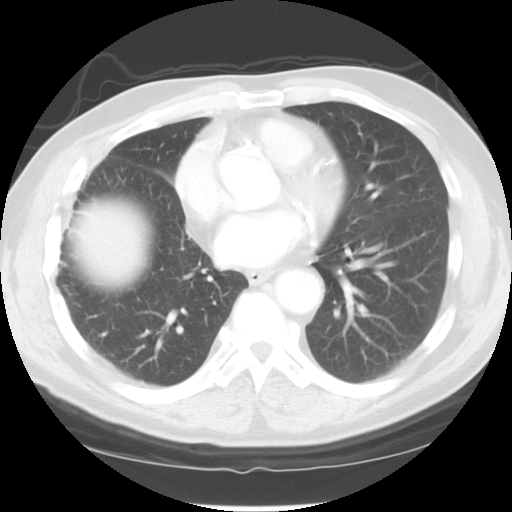
[im 38/68  lung]
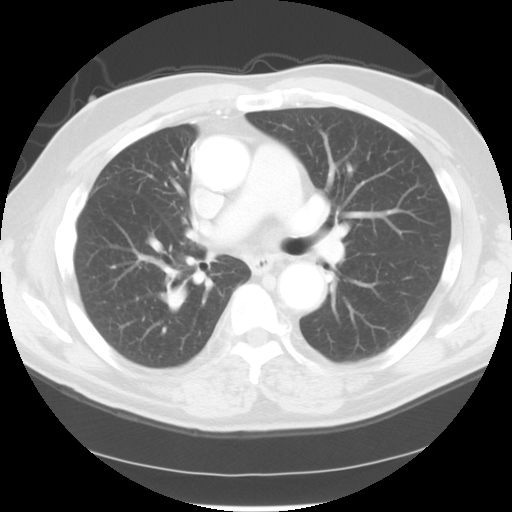
[im 43/68  lung]
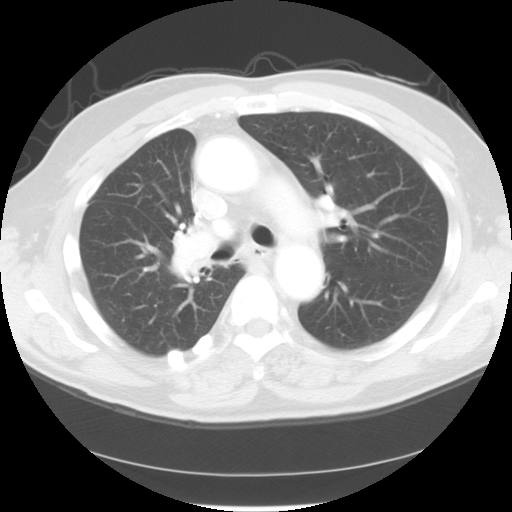
[im 48/68  mediastinal]
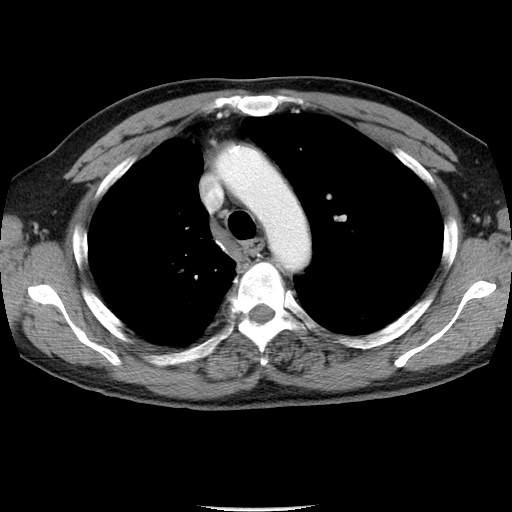
[im 48/68  lung]
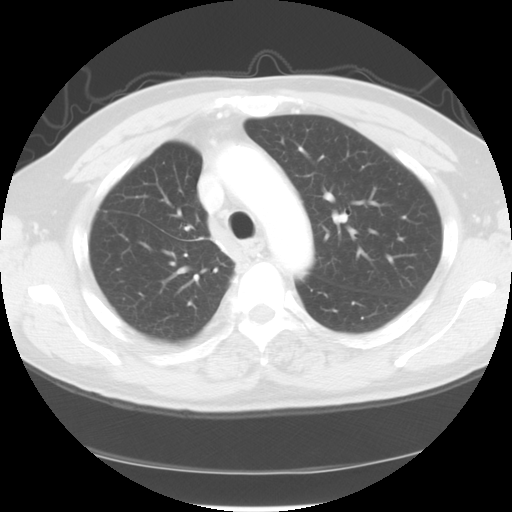
[im 53/68  lung]
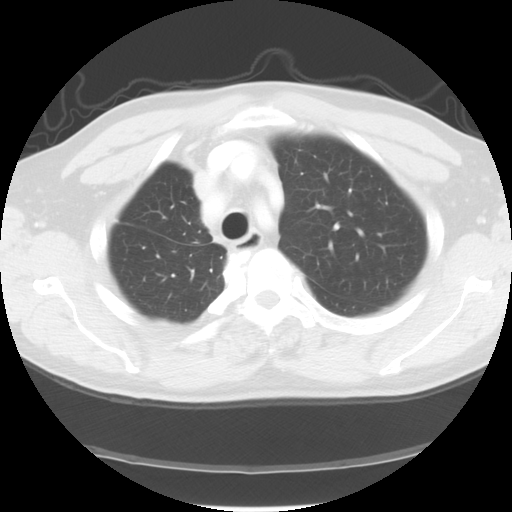
[im 58/68  lung]
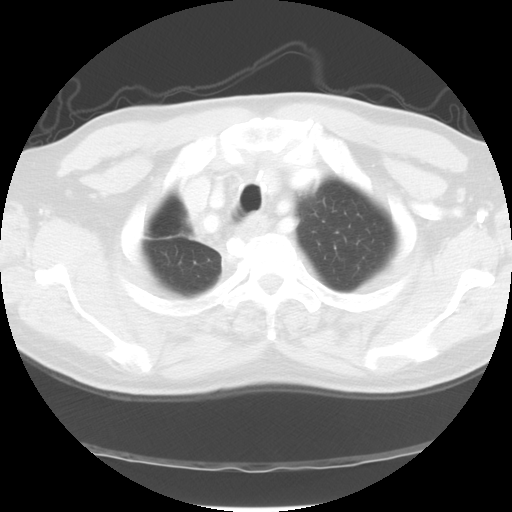
[im 63/68  lung]
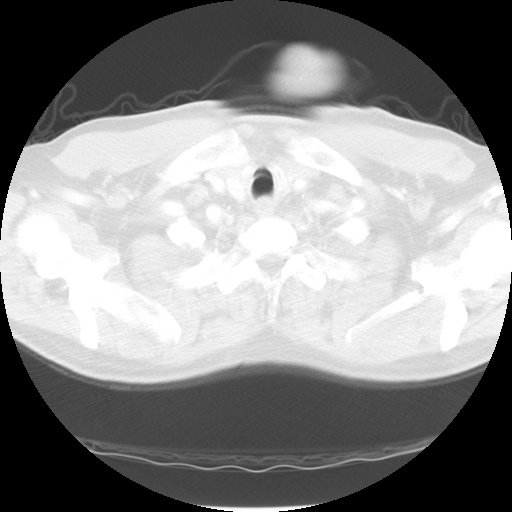

[Series 602: sagittal body · sagittal · 0.70mm/px · 3 of 145 slices shown]
[im 29/145  lung]
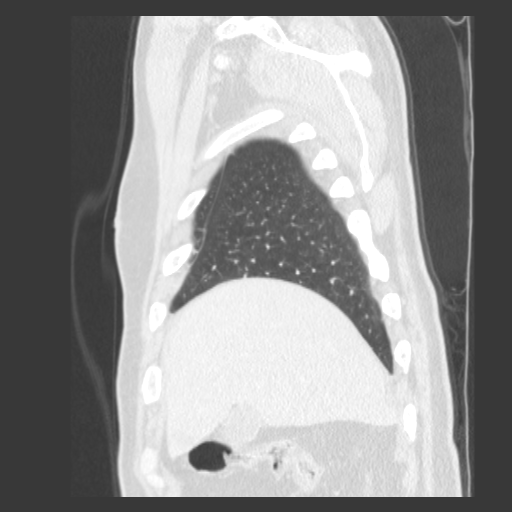
[im 58/145  lung]
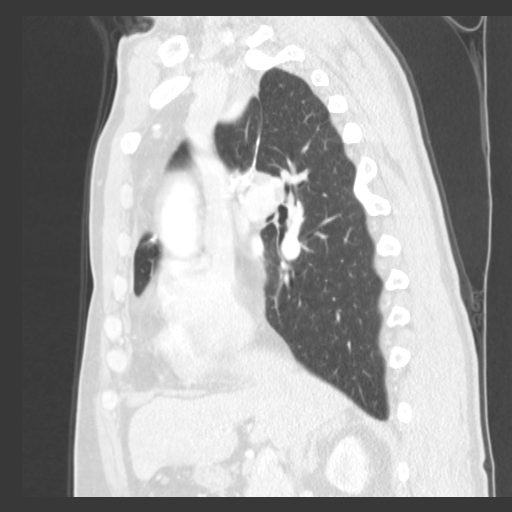
[im 87/145  lung]
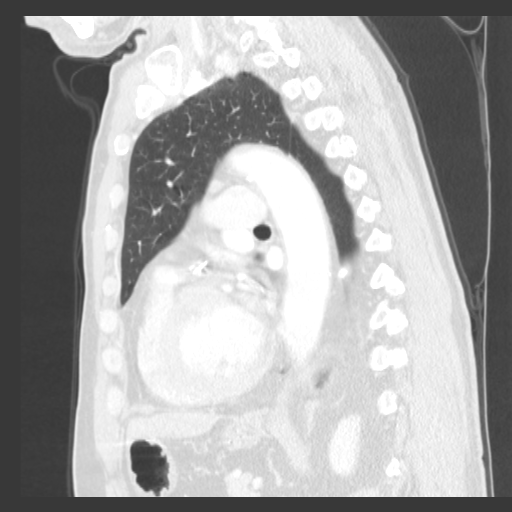

[15 of 36 positions shown; findings below may reference images not displayed]

FINDINGS: Lung windows demonstrate status post right upper
lobectomy.  Mild centrilobular emphysema.
Clear left lung.

Soft tissue windows demonstrate mild cardiomegaly with a advanced
coronary artery atherosclerosis.  No pericardial or pleural
effusion.  Right-sided pleural calcifications have been described
previously.  No mediastinal or hilar adenopathy.  Mildly enlarged
pulmonary outflow tract suggests pulmonary arterial hypertension.

Limited abdominal imaging demonstrates mildly decreased attenuation
of the liver likely related to fatty infiltration.  A vague area of
hyperenhancement in the right lobe the liver on image 51 of series
3.  This is increased in conspicuity since the 01/09/2007 exam.

Soft tissue density in the gallbladder neck is similar.  No
evidence of acute cholecystitis.  Normal adrenal glands.  Bilateral
too small to characterize renal lesions.  A interpolar left renal
lesion measures greater than fluid density today.  1.9 cm.

No worrisome osseous lesions.
IMPRESSION: 1.  Postsurgical changes in the right hemithorax without evidence
of recurrent or metastatic disease within the chest.
2.  Subtle area of hyperenhancement in the hepatic dome is of
increased conspicuity since the 01/09/2007 exam.  Considerations
include a hemangioma or a early hypervascular metastatic lesion.
Consider further evaluation with dedicated abdominal MR.
Alternatively, this could be reevaluated at follow-up CT.
3.  Advanced coronary artery atherosclerosis with probable mild
pulmonary arterial hypertension.
4.  A left renal lesion which measures greater than fluid density.
Most likely a hemorrhagic / complex cyst.  Followup attention
recommended.
5.  Fatty infiltration the liver.
6.  Probable gallstone within the gallbladder neck.  No evidence of
acute cholecystitis.

## 2010-03-02 ENCOUNTER — Telehealth: Payer: Self-pay | Admitting: Internal Medicine

## 2010-03-09 ENCOUNTER — Ambulatory Visit: Payer: Self-pay | Admitting: Internal Medicine

## 2010-03-09 LAB — CONVERTED CEMR LAB
AST: 17 units/L (ref 0–37)
Albumin: 4.3 g/dL (ref 3.5–5.2)
Alkaline Phosphatase: 70 units/L (ref 39–117)
BUN: 15 mg/dL (ref 6–23)
CO2: 26 meq/L (ref 19–32)
Calcium: 9.6 mg/dL (ref 8.4–10.5)
Chloride: 106 meq/L (ref 96–112)
Creatinine, Ser: 1.29 mg/dL (ref 0.40–1.50)
HDL: 39 mg/dL — ABNORMAL LOW (ref >39)
Indirect Bilirubin: 0.3 mg/dL (ref 0.0–0.9)
LDL Cholesterol: 109 mg/dL — ABNORMAL HIGH (ref 0–99)
Total Bilirubin: 0.5 mg/dL (ref 0.3–1.2)
Triglycerides: 68 mg/dL (ref <150)

## 2010-03-27 ENCOUNTER — Telehealth: Payer: Self-pay | Admitting: Internal Medicine

## 2010-04-02 ENCOUNTER — Telehealth: Payer: Self-pay | Admitting: Internal Medicine

## 2010-05-01 ENCOUNTER — Telehealth: Payer: Self-pay | Admitting: Internal Medicine

## 2010-05-30 ENCOUNTER — Telehealth: Payer: Self-pay | Admitting: Internal Medicine

## 2010-06-17 IMAGING — CT CT CHEST W/ CM
1 series · 15 of 31 positions shown, 19 images · IV contrast (agent unspecified)
Comparison: 10/06/2007

CLINICAL DATA: Lung cancer.

CT CHEST WITH CONTRAST
TECHNIQUE: Multidetector CT imaging of the chest was performed
following the standard protocol during bolus administration of
intravenous contrast.
Contrast: 80 ml 8mnipaque-UPP

[Series 2: chest_routine 5.0 b40f st · axial · 0.75mm/px · z∈[-372,-67]mm · 15 of 67 slices shown, 19 images]
[im 3/67  mediastinal]
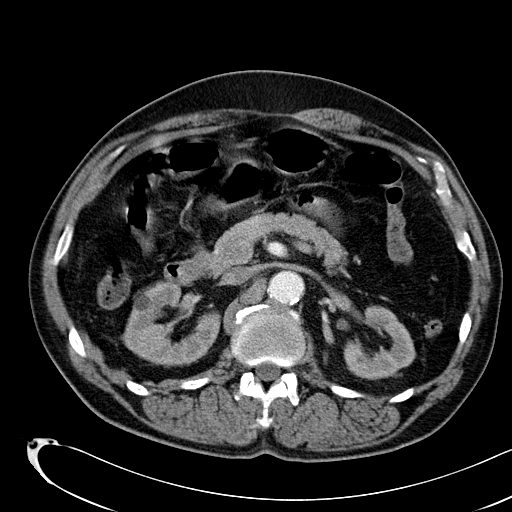
[im 3/67  lung]
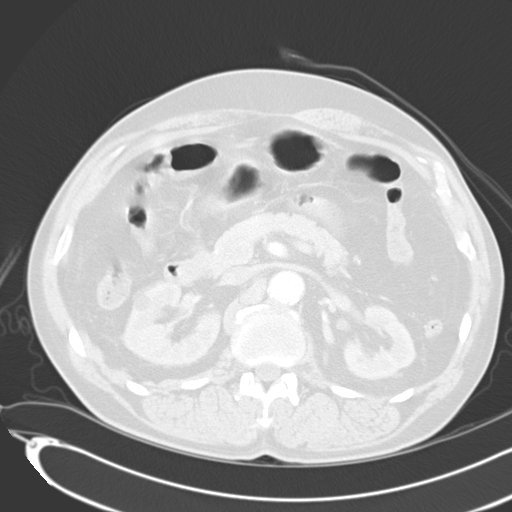
[im 8/67  lung]
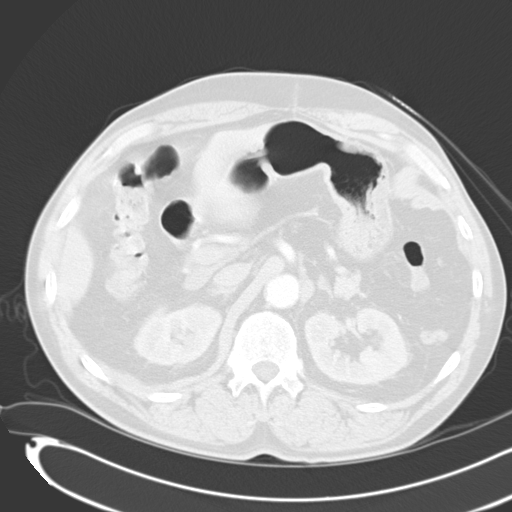
[im 13/67  lung]
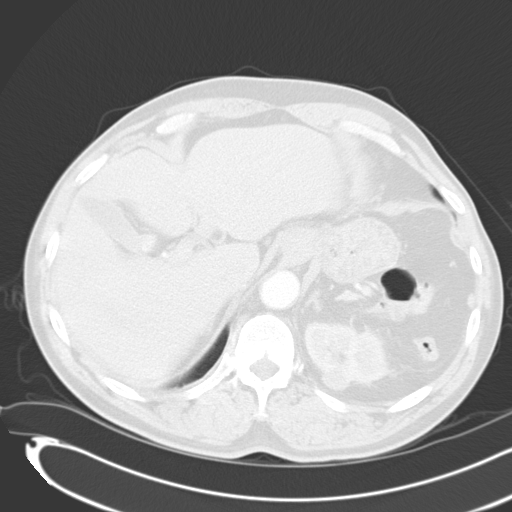
[im 15/67  lung]
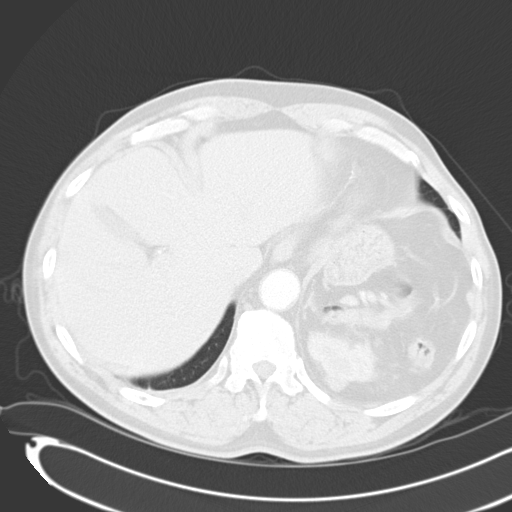
[im 20/67  mediastinal]
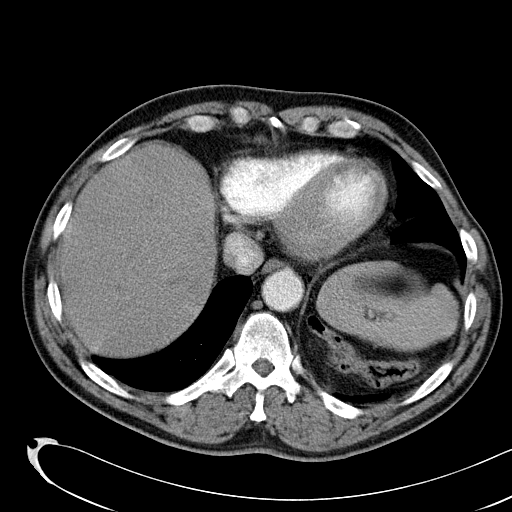
[im 20/67  lung]
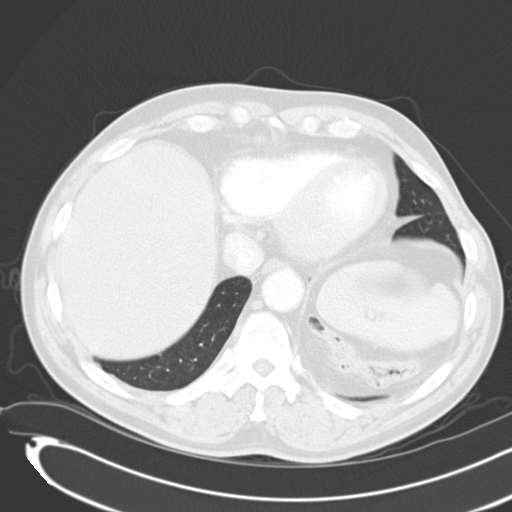
[im 25/67  lung]
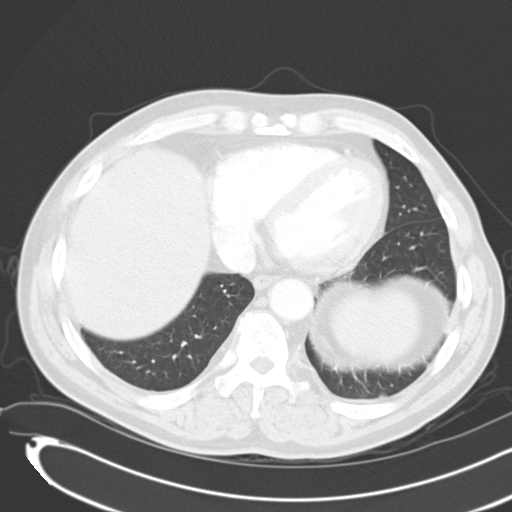
[im 30/67  lung]
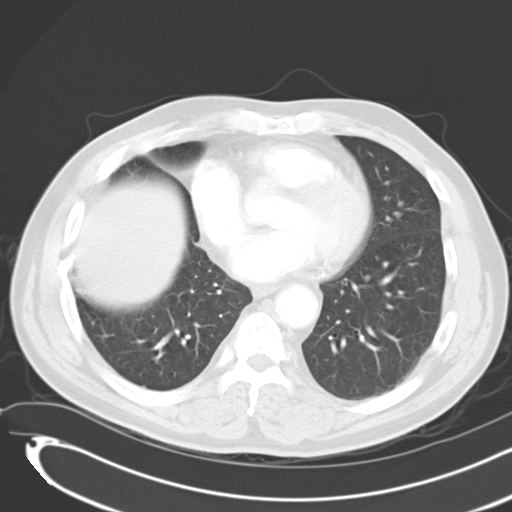
[im 35/67  lung]
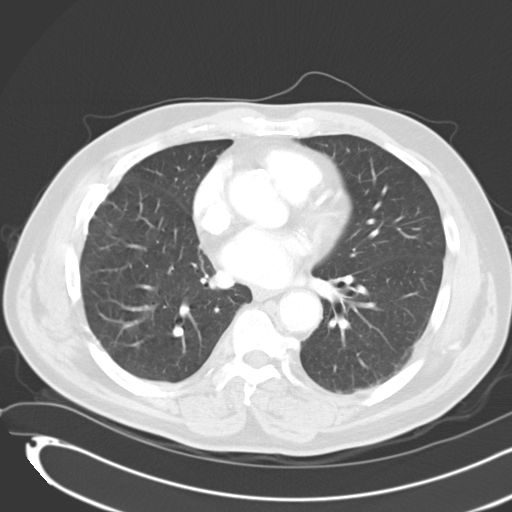
[im 37/67  mediastinal]
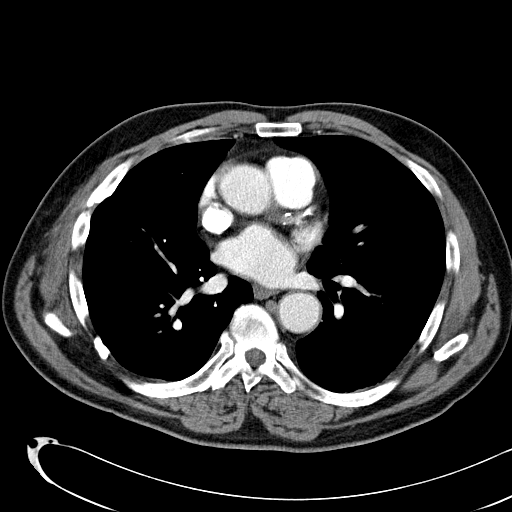
[im 37/67  lung]
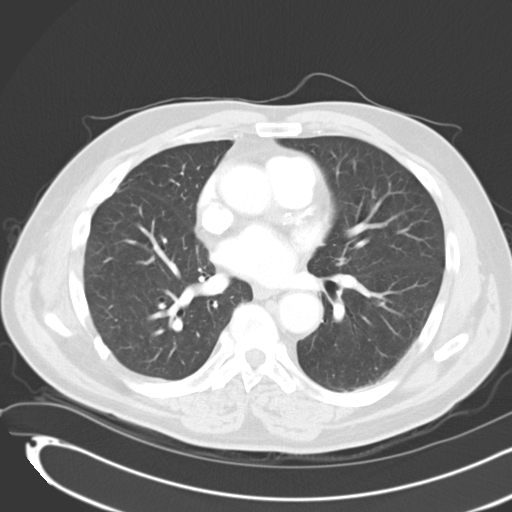
[im 42/67  lung]
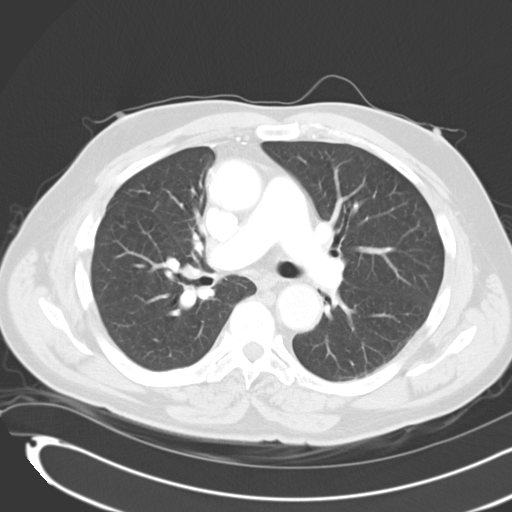
[im 47/67  lung]
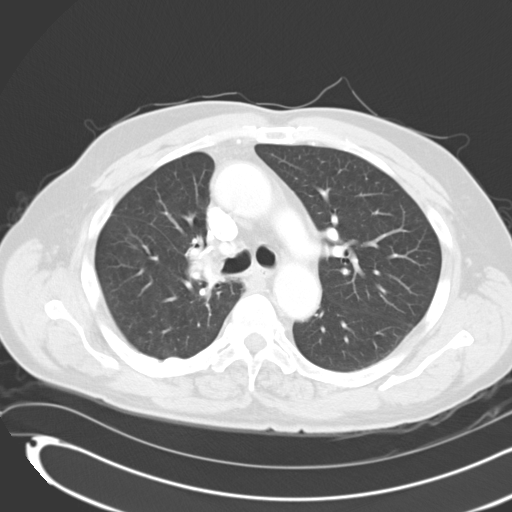
[im 52/67  lung]
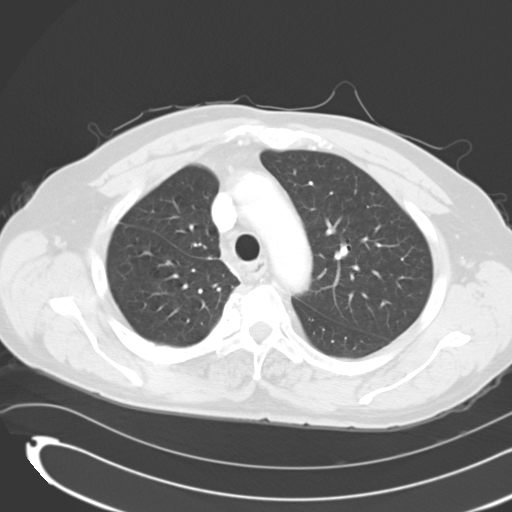
[im 54/67  mediastinal]
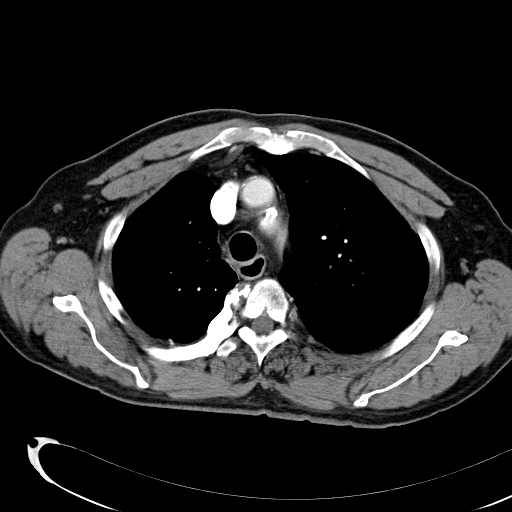
[im 54/67  lung]
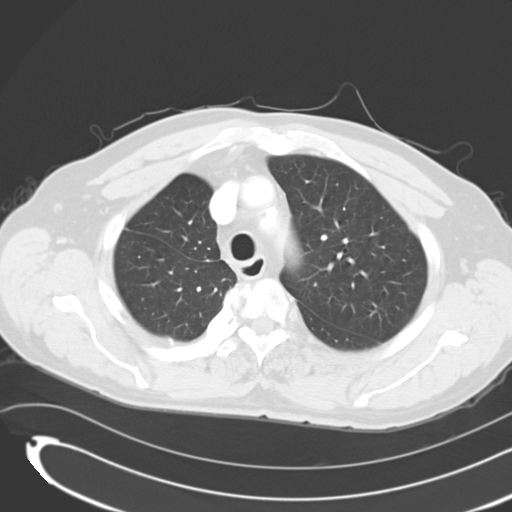
[im 59/67  lung]
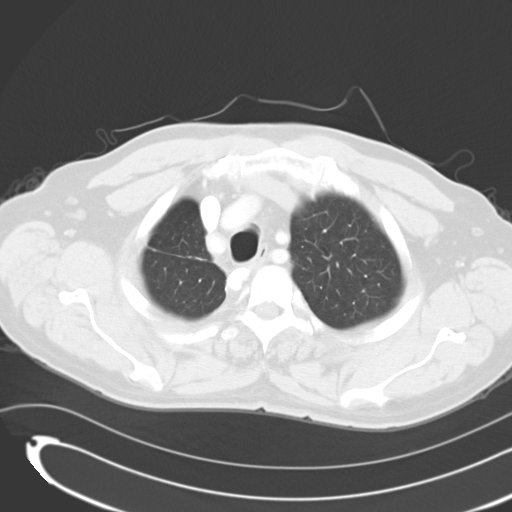
[im 64/67  lung]
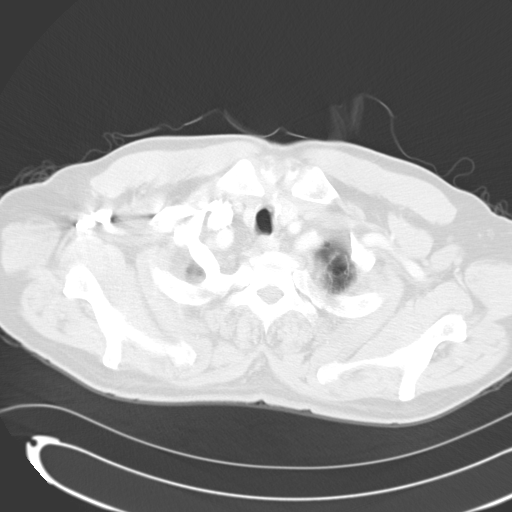

[15 of 31 positions shown; findings below may reference images not displayed]

FINDINGS: Tortuous branch vasculature noted.  Coronary
atherosclerosis is present.  Dependent density in the gallbladder
neck is suspicious for colon lithiasis.

Bilateral renal hypodense lesions are present; a left kidney upper
pole exophytic hypodense lesion measures 31 HU and is technically
nonspecific but is stable in size compared to 01/09/2007.

Right upper lobectomy noted, with bony intercostal bridges between
multiple right-sided ribs.  Underlying emphysema is present.

No new lung mass identified.

The faint hyperattenuation in the right hepatic lobe seen on the
prior exam is not readily identifiable on today's exam.
IMPRESSION: 1.  Currently no recurrent malignancy is identified.
2.  Right upper lobectomy.
3.  Previously identified liver lesion is not apparent on today's
exam.
4.  Coronary artery disease.
5.  Left upper pole complex renal lesion probably represents a
complex cyst but is not fully characterized.
6.  Probable cholelithiasis.

## 2010-06-19 ENCOUNTER — Encounter
Admission: RE | Admit: 2010-06-19 | Discharge: 2010-06-19 | Payer: Self-pay | Source: Home / Self Care | Attending: Thoracic Surgery (Cardiothoracic Vascular Surgery) | Admitting: Thoracic Surgery (Cardiothoracic Vascular Surgery)

## 2010-06-19 ENCOUNTER — Ambulatory Visit
Admission: RE | Admit: 2010-06-19 | Discharge: 2010-06-19 | Payer: Self-pay | Source: Home / Self Care | Attending: Thoracic Surgery (Cardiothoracic Vascular Surgery) | Admitting: Thoracic Surgery (Cardiothoracic Vascular Surgery)

## 2010-06-21 ENCOUNTER — Telehealth: Payer: Self-pay | Admitting: Internal Medicine

## 2010-06-29 ENCOUNTER — Telehealth: Payer: Self-pay | Admitting: Internal Medicine

## 2010-07-07 ENCOUNTER — Other Ambulatory Visit: Payer: Self-pay | Admitting: Internal Medicine

## 2010-07-07 DIAGNOSIS — C3490 Malignant neoplasm of unspecified part of unspecified bronchus or lung: Secondary | ICD-10-CM

## 2010-07-08 ENCOUNTER — Encounter: Payer: Self-pay | Admitting: Internal Medicine

## 2010-07-08 ENCOUNTER — Encounter: Payer: Self-pay | Admitting: Thoracic Surgery (Cardiothoracic Vascular Surgery)

## 2010-07-13 ENCOUNTER — Emergency Department (HOSPITAL_COMMUNITY)
Admission: EM | Admit: 2010-07-13 | Discharge: 2010-07-14 | Payer: Self-pay | Source: Home / Self Care | Admitting: Emergency Medicine

## 2010-07-14 LAB — DIFFERENTIAL
Basophils Absolute: 0 10*3/uL (ref 0.0–0.1)
Basophils Relative: 0 % (ref 0–1)
Lymphocytes Relative: 29 % (ref 12–46)
Monocytes Absolute: 0.4 10*3/uL (ref 0.1–1.0)
Neutro Abs: 4.7 10*3/uL (ref 1.7–7.7)
Neutrophils Relative %: 64 % (ref 43–77)

## 2010-07-14 LAB — BASIC METABOLIC PANEL
CO2: 23 mEq/L (ref 19–32)
Calcium: 9.1 mg/dL (ref 8.4–10.5)
Chloride: 105 mEq/L (ref 96–112)
GFR calc Af Amer: >60 mL/min (ref >60)
Glucose, Bld: 110 mg/dL — ABNORMAL HIGH (ref 70–99)
Potassium: 3.5 mEq/L (ref 3.5–5.1)
Sodium: 141 mEq/L (ref 135–145)

## 2010-07-14 LAB — CBC
HCT: 41.5 % (ref 39.0–52.0)
Hemoglobin: 13.6 g/dL (ref 13.0–17.0)
MCHC: 32.8 g/dL (ref 30.0–36.0)
RBC: 4.45 MIL/uL (ref 4.22–5.81)
WBC: 7.3 10*3/uL (ref 4.0–10.5)

## 2010-07-17 NOTE — Assessment & Plan Note (Signed)
Summary: ACUTE/TOBBIA/HIP AND SHOULDER PAIN/CH   Vital Signs:  Patient profile:   65 year old male Height:      74 inches Weight:      211.7 pounds BMI:     27.28 Temp:     97.8 degrees F oral Pulse rate:   66 / minute BP sitting:   124 / 78  (right arm)  Vitals Entered By: Filomena Jungling NT II (March 09, 2010 11:34 AM) CC: FOLLow-up visit FOR PAIN AND BLOOD PRESSURE/ FLU SHOT Is Patient Diabetic? No Pain Assessment Patient in pain? no      Nutritional Status BMI of 25 - 29 = overweight  Have you ever been in a relationship where you felt threatened, hurt or afraid?No   Does patient need assistance? Functional Status Self care Ambulation Normal   Primary Care Mitali Shene[place]:  Brooks Sailors, MD   CC:  FOLLow-up visit FOR PAIN AND BLOOD PRESSURE/ FLU SHOT.  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   Pt's pain is well controlled with his current pain regimen.  He takes his vicodin 3  times a day depending on his pain level.   He is still smoking but has cut down to  ~1 pack/week.  Pt denies fever, chills, or any other complaints, and otherwise doing well.    Preventive Screening-Counseling & Management  Alcohol-Tobacco     Alcohol type: Liquor sometimes     Smoking Status: current     Smoking Cessation Counseling: yes     Packs/Day: 1 ppweek     Year Started: age 23  Caffeine-Diet-Exercise     Does Patient Exercise: yes     Type of exercise: WALKING     Times/week: AT TIMES  Current Medications (verified): 1)  Omeprazole 20 Mg  Cpdr (Omeprazole) .... Take 1 Tablet By Mouth Two Times A Day 2)  Aspirin 325 Mg Tabs (Aspirin) .... Take 1 Tablet By Mouth Once A Day 3)  Colchicine 0.6 Mg Tabs (Colchicine) .... Take 1 Tablet By Mouth Two Times A Day As Needed For Gout Flare  Up 4)  Indomethacin 25 Mg Caps (Indomethacin) .... Take 1-2 Capsules By Mouth Up To Three Times A Day As Needed For Gout Pain 5)  Lopressor 50 Mg Tabs (Metoprolol Tartrate) .... Take Half Tablet By Mouth Two Times A Day 6)  Norvasc 10 Mg Tabs (Amlodipine Besylate) .... Take 1 Tablet By Mouth Once A Day 7)  Crestor 10 Mg  Tabs (Rosuvastatin Calcium) .... Take 1 Tablet By Mouth Once A Day 8)  Hydrocodone-Acetaminophen 7.5-500 Mg Tabs (Hydrocodone-Acetaminophen) .... Take 1 Tab By Mouth Every 6 Hours As Needed For Pain. 9)  Furosemide 20 Mg Tabs (Furosemide) .... Take 1 Tablet By Mouth Once A Day  Allergies (verified): 1)  ! Atacand Hct (Candesartan Cilexetil-Hctz) 2)  ! Hydrochlorothiazide (Hydrochlorothiazide)  Past History:  Past Medical History: Last updated: 03/13/2007 Diverticulosis, colon GERD Gout Hyperlipidemia Hypertension Myocardial infarction, hx of, 2 stents Renal failure-acute, hx of 2/2 medication, resolved Barrett's esophagitis Tobacco abuse Alcohol abuse Adenomatous colonic polyp Lung cancer, hx of, squamous cell ([id number]), 11/07- surgically treated, no Chemo/XRT  Past Surgical History: Last updated: 04/18/2006 Lung-lobectomy, RUL, 11/07  Family History: Last updated: 07/13/2009 M 83 DM F 30s? unknown 4 sibs - 1 bro with arthritis,  1 sis with htn, heart disease No FH of Colon Cancer:  Social History: Last updated: 03/09/2010 Single--Divorced  No childern Current Smoker Alcohol use-not frequently, maybe once a week Retired Corporate investment banker Regular exercise-yes Drug use-no  Risk Factors: Exercise: yes (03/09/2010)  Risk Factors: Smoking Status: current (03/09/2010) Packs/Day: 1 ppweek (03/09/2010)  Social History: Single--Divorced  No childern Current Smoker Alcohol use-not frequently, maybe once a week Retired Corporate investment banker Regular exercise-yes Drug use-no  Review of Systems      See HPI  Physical Exam  General:  alert and  well-developed.   Head:  normocephalic and atraumatic.   Eyes:  vision grossly intact, pupils equal, pupils round, and pupils reactive to light.   Neck:  supple.   Lungs:  normal respiratory effort and normal breath sounds.   Heart:  normal rate and regular rhythm.   Abdomen:  soft and non-tender.   Msk:  normal ROM.   Extremities:  no edema present  Neurologic:  alert & oriented X3.     Impression & Recommendations:  Problem # 1:  ARTHRITIS, LEFT HIP (ICD-716.95) Assessment Improved Stable on chronic narcotics. Does not need refill. Continue current therapy.  Problem # 2:  CARCINOMA, LUNG, SQUAMOUS CELL (ICD-162.9) Assessment: Comment Only Pt was seen by cancer center in Aug. 2011. Pt has history of Stage IB non-small cell lung cancer s/p RUL lobectomy in 2007. Pt is stable and doing well, and will follow up with oncology in 1 year.   Problem # 3:  HYPERTENSION (ICD-401.9) Assessment: Improved Well controlled. Will continue current regimen and check Bmet today.   His updated medication list for this problem includes:    Lopressor 50 Mg Tabs (Metoprolol tartrate) .Marland Kitchen... Take half tablet by mouth two times a day    Norvasc 10 Mg Tabs (Amlodipine besylate) .Marland Kitchen... Take 1 tablet by mouth once a day    Furosemide 20 Mg Tabs (Furosemide) .Marland Kitchen... Take 1 tablet by mouth once a day  Orders: T-Basic Metabolic Panel [phone])  BP today: 124/78 Prior BP: 124/86 (01/01/2010)  Prior 10 Yr Risk Heart Disease: N/A (01/28/2007)  Labs Reviewed: K+: 4.2 (06/30/2009) Creat: : 1.26 (06/30/2009)   Chol: 193 (06/30/2009)   HDL: 54 (06/30/2009)   LDL: 124 (06/30/2009)   TG: 76 (06/30/2009)  Problem # 4:  HYPERLIPIDEMIA (ICD-272.4) Will check lipid profile today, and adjust meds if needed. For now continue current statin and also check LFTs.   His updated medication list for this problem includes:    Crestor 10 Mg Tabs (Rosuvastatin calcium) .Marland Kitchen... Take 1 tablet by mouth once a  day  Orders: T-Lipid Profile [id number])  Labs Reviewed: SGOT: 23 (09/15/2008)   SGPT: 16 (09/15/2008)  Prior 10 Yr Risk Heart Disease: N/A (01/28/2007)   HDL:54 (06/30/2009), 49 (02/07/2009)  LDL:124 (06/30/2009), 104 (02/07/2009)  Chol:193 (06/30/2009), 172 (02/07/2009)  Trig:76 (06/30/2009), 94 (02/07/2009)  Problem # 5:  CHRONIC KIDNEY DISEASE UNSPECIFIED (ICD-585.9) Assessment: Improved Secondary to HTN. Baseline Cr between 1.3 - 1.6. Currently at 1.26. Will check renal function today. Goal is to maximize blood pressure control to prevent further renal dysfunction. Pt was previously on Lisinopril but that caused acute on chronic renal failure and thus was discontinued. Pt on ccb, diuretic and bblocker currently. Will continue this regimen.   Complete Medication List: 1)  Omeprazole 20 Mg Cpdr (Omeprazole) .... Take 1 tablet by mouth two times a day 2)  Aspirin 325 Mg Tabs (Aspirin) .... Take 1 tablet by mouth once a day 3)  Colchicine 0.6 Mg Tabs (Colchicine) .... Take 1 tablet by mouth two times a day as needed for gout flare up 4)  Indomethacin 25 Mg Caps (Indomethacin) .... Take 1-2 capsules by mouth up to three times a day as needed for gout pain 5)  Lopressor 50 Mg Tabs (Metoprolol tartrate) .... Take half tablet by mouth two times a day 6)  Norvasc 10 Mg Tabs (Amlodipine besylate) .... Take 1 tablet by mouth once a day 7)  Crestor 10 Mg Tabs (Rosuvastatin calcium) .... Take 1 tablet by mouth once a day 8)  Hydrocodone-acetaminophen 7.5-500 Mg Tabs (Hydrocodone-acetaminophen) .... Take 1 tab by mouth every 6 hours as needed for pain. 9)  Furosemide 20 Mg Tabs (Furosemide) .... Take 1 tablet by mouth once a day  Other Orders: T-Hepatic Function ([id number])  Patient Instructions: 1)  Please follow up as needed.  2)  Tobacco is very bad for your health and your loved ones! You Should stop smoking!. 3)  Stop Smoking Tips: Choose a Quit date. Cut down before the Quit date.  decide what you will do as a substitute when you feel the urge to smoke(gum,toothpick,exercise).  Prevention & Chronic Care Immunizations   Influenza vaccine: Fluvax MCR  (04/20/2009)   Influenza vaccine deferral: Refused  (03/13/2009)    Tetanus booster: 03/13/2009: Tdap    Pneumococcal vaccine: Not documented    H. zoster vaccine: Not documented   H. zoster vaccine deferral: Deferred  (03/13/2009)  Colorectal Screening   Hemoccult: Not documented   Hemoccult action/deferral: Not indicated  (03/13/2009)    Colonoscopy: Results: Hemorrhoids.     Location:  [facility].    (07/31/2006)   Colonoscopy action/deferral: Repeat colonoscopy in 5 years.   (07/31/2006)   Colonoscopy due: 07/2011  Other Screening   PSA: Not documented   PSA action/deferral: Discussion deferred  (03/13/2009)   Smoking status: current  (03/09/2010)   Smoking cessation counseling: yes  (03/09/2010)  Lipids   Total Cholesterol: 193  (06/30/2009)   Lipid panel action/deferral: Lipid Panel ordered   LDL: 124  (06/30/2009)   LDL Direct: Not documented   HDL: 54  (06/30/2009)   Triglycerides: 76  (06/30/2009)    SGOT (AST): 23  (09/15/2008)   BMP action: Ordered   SGPT (ALT): 16  (09/15/2008)   Alkaline phosphatase: 85  (09/15/2008)   Total bilirubin: 0.4  (09/15/2008)    Lipid flowsheet reviewed?: Yes   Progress toward LDL goal: Deteriorated  Hypertension   Last Blood Pressure: 124 / 78  (03/09/2010)   Serum creatinine: 1.26  (06/30/2009)   BMP action: Ordered   Serum potassium 4.2  (06/30/2009)    Hypertension flowsheet reviewed?: Yes   Progress toward BP goal: At goal  Self-Management Support :   Personal Goals (by the next clinic visit) :      Personal blood pressure goal: 140/90  (03/13/2009)     Personal LDL goal: 100  (03/13/2009)    Patient will work on the following items until the next clinic visit to reach self-care goals:     Medications and monitoring: take my  medicines every day, bring all of my medications to every visit  (03/09/2010)     Eating: drink diet soda or water instead of juice or soda, eat more vegetables, use fresh or frozen vegetables, eat foods that are low in salt, eat baked foods instead of fried foods, eat fruit for snacks and desserts  (03/09/2010)     Activity: take a 30  minute walk every day  (01/01/2010)    Hypertension self-management support: Education handout, Resources for patients handout  (03/09/2010)   Hypertension education handout printed    Lipid self-management support: Education handout, Resources for patients handout  (03/09/2010)     Lipid education handout printed      Resource handout printed.   Nursing Instructions: Give Flu vaccine today    Process Orders Check Orders Results:     [facility]: Check successful Tests Sent for requisitioning (March 09, 2010 12:32 PM):     03/09/2010: [facility] -- T-Lipid Profile [phone] (signed)     03/09/2010: [facility] -- T-Hepatic Function [phone] (signed)     03/09/2010: [facility] -- T-Basic Metabolic Panel [phone] (signed)    Appended Document: ACUTE/TOBBIA/HIP AND SHOULDER PAIN/CH   Immunizations Administered:  Influenza Vaccine # 1:    Vaccine Type: Fluvax MCR    Site: right deltoid    Mfr: GlaxoSmithKline    Dose: 0.5 ml    Route: IM    Given by: Angelina Ok RN    Exp. Date: 12/15/2010    Lot #: [id number]    VIS given: 01/09/10 version given March 09, 2010.  Flu Vaccine Consent Questions:    Do you have a history of severe allergic reactions to this vaccine? no    Any prior history of allergic reactions to egg and/or gelatin? no    Do you have a sensitivity to the preservative Thimersol? no    Do you have a past history of Guillan-Barre Syndrome? no    Do you currently have an acute febrile illness? no    Have you ever had a severe reaction to  latex? no    Vaccine information given and explained to patient? yes

## 2010-07-17 NOTE — Progress Notes (Signed)
Summary: Refill/gh  Phone Note Refill Request Message from:  Fax from Pharmacy on Oct 30, 2009 3:55 PM  Refills Requested: Medication #1:  HYDROCODONE-ACETAMINOPHEN 7.5-500 MG TABS Take 1 tab by mouth every 6 hours as needed for pain.   Last Refilled: 10/27/2009 For next month.   Method Requested: Electronic Initial call taken by: Angelina Ok RN,  Oct 30, 2009 3:55 PM  Follow-up for Phone Call        Rx denied because it is too early.  I will fill it next month. Follow-up by: Brooks Sailors MD,  Nov 02, 2009 1:51 PM  Additional Follow-up for Phone Call Additional follow up Details #1::        Call to pt's pharmacy pt got refills on 3/17, 4/16 and 5/13.  Messge to pharmacy that request denied and presription is due for refill nex month. Additional Follow-up by: Angelina Ok RN,  Nov 02, 2009 3:29 PM

## 2010-07-17 NOTE — Miscellaneous (Signed)
  Clinical Lists Changes  Medications: Rx of HYDROCODONE-ACETAMINOPHEN 7.5-500 MG TABS (HYDROCODONE-ACETAMINOPHEN) Take 1 tab by mouth every 6 hours as needed for pain.;  #90 x 0;  Signed;  Entered by: Vassie Loll MD;  Authorized by: Vassie Loll MD;  Method used: [facility] to [facility] [address] [phone]*, [address] [place], [place], [place]  [address], Ph: [phone] or [phone], Fax: [phone]  Impression & Recommendations:  Problem # 1:  ARTHRITIS, LEFT HIP [phone]) Patient on chronic vicodin use for his arthritis. aparently would be evaluated for this on July 18 by PCP, in order to determine needs of chronic narcotics, check UDS and also to sign a pain contract. Will refill his medication today with enough pills to last him until his followup appointmnet and will let PCP to decide after evaluation if further refills are granted.  Complete Medication List: 1)  Omeprazole 20 Mg Cpdr (Omeprazole) .... Take 1 tablet by mouth two times a day 2)  Aspirin 325 Mg Tabs (Aspirin) .... Take 1 tablet by mouth once a day 3)  Colchicine 0.6 Mg Tabs (Colchicine) .... Take 1 tablet by mouth two times a day as needed for gout flare up 4)  Indomethacin 25 Mg Caps (Indomethacin) .... Take 1-2 capsules by mouth up to three times a day as needed for gout pain 5)  Lopressor 50 Mg Tabs (Metoprolol tartrate) .... Take half tablet by mouth two times a day 6)  Norvasc 10 Mg Tabs (Amlodipine besylate) .... Take 1 tablet by mouth once a day 7)  Crestor 10 Mg Tabs (Rosuvastatin calcium) .... Take 1 tablet by mouth once a day 8)  Hydrocodone-acetaminophen 7.5-500 Mg Tabs (Hydrocodone-acetaminophen) .... Take 1 tab by mouth every 6 hours as needed for pain. 9)  Furosemide 20 Mg Tabs (Furosemide) .... Take 1 tablet by mouth once a day     Prescriptions: HYDROCODONE-ACETAMINOPHEN 7.5-500 MG TABS (HYDROCODONE-ACETAMINOPHEN) Take 1 tab by mouth every 6 hours as needed for pain.  #90 x 0  Entered and Authorized by:   Vassie Loll MD   Signed by:   Vassie Loll MD on 12/08/2009   Method used:   Telephoned to ...       [facility] [address] [phone]* (retail)       [address]       [place], [place]  [address]       Ph: [phone] or [phone]       Fax: [phone]   RxID:   [id number]

## 2010-07-17 NOTE — Progress Notes (Signed)
Summary: med refill/gp  Phone Note Refill Request Message from:  Fax from Pharmacy on March 27, 2010 10:54 AM  Refills Requested: Medication #1:  OMEPRAZOLE 20 MG  CPDR Take 1 tablet by mouth two times a day   Last Refilled: 02/22/2010 LAST APPT. 9/23.   Method Requested: Electronic Initial call taken by: Chinita Pester RN,  March 27, 2010 10:54 AM    Prescriptions: OMEPRAZOLE 20 MG  CPDR (OMEPRAZOLE) Take 1 tablet by mouth two times a day  #60 x 11   Entered and Authorized by:   Darnelle Maffucci MD   Signed by:   Darnelle Maffucci MD on 03/27/2010   Method used:   [facility]ronically to        [facility] [address] [phone]* (retail)       [address]       [place], [place]  [address]       Ph: [phone] or [phone]       Fax: [phone]   RxID:   [id number]

## 2010-07-17 NOTE — Progress Notes (Signed)
Summary: refill/ hla  Phone Note Refill Request Message from:  Fax from Pharmacy on January 01, 2010 12:47 PM  Refills Requested: Medication #1:  LOPRESSOR 50 MG TABS Take half tablet by mouth two times a day   Dosage confirmed as above?Dosage Confirmed   Last Refilled: 6/17 last visit 06/29/2009, last labs 06/30/2009  Initial call taken by: Marin Roberts RN,  January 01, 2010 12:48 PM  Follow-up for Phone Call       Follow-up by: Darnelle Maffucci MD,  January 01, 2010 12:59 PM    Prescriptions: LOPRESSOR 50 MG TABS (METOPROLOL TARTRATE) Take half tablet by mouth two times a day  #31 x 5   Entered and Authorized by:   Darnelle Maffucci MD   Signed by:   Darnelle Maffucci MD on 01/01/2010   Method used:   Electronically to        [facility] [address] [phone]* (retail)       [address]       [place], [place]  [address]       Ph: [phone] or [phone]       Fax: [phone]   RxID:   [id number]

## 2010-07-17 NOTE — Letter (Signed)
Summary: Patient [facility] Biopsy Results  [facility]  [address] [place], [place] [address]   Phone: [phone]  Fax: [phone]        August 01, 2009 MRN: [id number]    [facility] [address] [place], [place]  [address]    Dear Mr. Mcnaught,  I am pleased to inform you that the biopsies taken during your recent endoscopic examination did not show any evidence of cancer or pre-cancerous changes upon pathologic examination.  I recommend you continue to treat your acid refux with omeprazole and have a repeat upper endoscopy examination to check on your history of prior Barrett's esophagus in 3 years.  Please call us if you are having persistent problems or have questions about your condition that have not been fully answered at this time.   Sincerely,  Iva Boop MD, [facility]  This letter has been electronically signed by your physician.  Appended Document: Patient Notice-Endo Biopsy Results Letter mailed 2.18.11

## 2010-07-17 NOTE — Assessment & Plan Note (Signed)
Summary: F/U/EST/VS   Vital Signs:  Patient profile:   65 year old male Height:      74 inches Weight:      212.6 pounds BMI:     27.39 Temp:     97.8 degrees F oral Pulse rate:   72 / minute BP sitting:   155 / 85  (right arm)  Vitals Entered By: Filomena Jungling NT II (June 29, 2009 1:37 PM) CC: follow-up visit Is Patient Diabetic? No Pain Assessment Patient in pain? no      Nutritional Status BMI of 25 - 29 = overweight  Does patient need assistance? Functional Status Self care Ambulation Normal   Primary Care Provider:  Brooks Sailors MD  CC:  follow-up visit.  History of Present Illness: Pt's pain is well controlled with his current pain regimen.  He takes his vicodin 4-6 times a day depending on his pain level.   He is taking his BP medicines daily.  He does not take his BP at home or at the pharmacy.   He takes his statin as scheduled. He is still smoking but has cut down to  ~3 cigarettes/week. He has no new complaints.  Preventive Screening-Counseling & Management  Alcohol-Tobacco     Alcohol type: Liquor sometimes     Smoking Status: current     Smoking Cessation Counseling: yes     Packs/Day: 1 cig every 3 days     Year Started: age 73  Caffeine-Diet-Exercise     Does Patient Exercise: yes     Type of exercise: WALKING     Times/week: AT TIMES  Current Medications (verified): 1)  Omeprazole 20 Mg  Cpdr (Omeprazole) .... Take 1 Tablet By Mouth Two Times A Day 2)  Aspirin 325 Mg Tabs (Aspirin) .... Take 1 Tablet By Mouth Once A Day 3)  Colchicine 0.6 Mg Tabs (Colchicine) .... Take 1 Tablet By Mouth Two Times A Day As Needed For Gout Flare Up 4)  Indomethacin 25 Mg Caps (Indomethacin) .... Take 1-2 Capsules By Mouth Up To Three Times A Day As Needed For Gout Pain 5)  Lopressor 50 Mg Tabs (Metoprolol Tartrate) .... Take Half Tablet By Mouth Two Times A Day 6)  Norvasc 10 Mg Tabs (Amlodipine Besylate) .... Take 1 Tablet By Mouth Once A Day 7)   Crestor 10 Mg  Tabs (Rosuvastatin Calcium) .... Take 1 Tablet By Mouth Once A Day 8)  Hydrocodone-Acetaminophen 7.5-500 Mg Tabs (Hydrocodone-Acetaminophen) .... Take 1 Tab By Mouth Every 6 Hours As Needed For Pain.  Allergies (verified): 1)  ! Atacand Hct (Candesartan Cilexetil-Hctz) 2)  ! 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    Physical Exam  General:  alert, well-developed, and well-nourished.   Head:  normocephalic and atraumatic.   Eyes:  vision grossly intact, pupils equal, and pupils round.   scleral  melanosis, cataracts bilat Mouth:  no gingival abnormalities and pharynx pink and moist.   Neck:  supple.   Lungs:  normal respiratory effort, normal breath sounds, no dullness, no crackles, and no wheezes.   Heart:  normal rate, regular rhythm, no murmur, no gallop, no rub, and no JVD.   Abdomen:  soft, non-tender, normal bowel sounds, no distention, no masses, and no guarding.   Extremities:  no edema Neurologic:  alert & oriented X3, cranial nerves II-XII intact, strength normal in all extremities, and sensation intact to light  touch.     Impression & Recommendations:  Problem # 1:  HYPERTENSION (ICD-401.9) Pt's BP is slightly improved but still not at goal.  I will add low dose Lasix today and recheck in 1 month.  I will check labs today and recheck at next appointment as well so I can make sure his K is stable on Lasix. His updated medication list for this problem includes:    Lopressor 50 Mg Tabs (Metoprolol tartrate) .Marland Kitchen... Take half tablet by mouth two times a day    Norvasc 10 Mg Tabs (Amlodipine besylate) .Marland Kitchen... Take 1 tablet by mouth once a day    Furosemide 20 Mg Tabs (Furosemide) .Marland Kitchen... Take 1 tablet by mouth once a day  Orders: T-Basic Metabolic Panel ([phone])  Problem # 2:  HYPERLIPIDEMIA (ICD-272.4) Taking Crestor.  Recheck lipids today. His updated medication list for this problem includes:    Crestor 10 Mg Tabs (Rosuvastatin calcium) .Marland Kitchen... Take 1 tablet by mouth once a day  Orders: T-Lipid Profile ([id number])  Problem # 3:  TOBACCO ABUSE (ICD-305.1) Pt still smoking a small amount.  He was counseled on the importance of quitting all together. The following medications were removed from the medication list:    Chantix Starting Month Pak 0.5 Mg X 11 & 1 Mg X 42 Tabs (Varenicline tartrate) .Marland Kitchen... Take 0.5 mg everyday for first 3 days then 0.5 mg twice daily for next 4 days and then 1mg  twice daily from day 8.  Problem # 4:  ARTHRITIS, LEFT HIP (ICD-716.95) Pt signed pain contract today and I will check a baseline UDS.  His pain is well controlled on current meds.  Orders: T-Drug Screen-Urine, (single) ([id number])  Problem # 5:  CARCINOMA, LUNG, SQUAMOUS CELL (ICD-162.9) Pt will f/u with his hematologist-oncologist next month and have a repeat CT scan at that time.  He has no new symptoms regarding his h/o cancer.  Breathing well, no weight loss.  Complete Medication List: 1)  Omeprazole 20 Mg Cpdr (Omeprazole) .... Take 1 tablet by mouth two times a day 2)  Aspirin 325 Mg Tabs  (Aspirin) .... Take 1 tablet by mouth once a day 3)  Colchicine 0.6 Mg Tabs (Colchicine) .... Take 1 tablet by mouth two times a day as needed for gout flare up 4)  Indomethacin 25 Mg Caps (Indomethacin) .... Take 1-2 capsules by mouth up to three times a day as needed for gout pain 5)  Lopressor 50 Mg Tabs (Metoprolol tartrate) .... Take half tablet by mouth two times a day 6)  Norvasc 10 Mg Tabs (Amlodipine besylate) .... Take 1 tablet by mouth once a day 7)  Crestor 10 Mg Tabs (Rosuvastatin calcium) .... Take 1 tablet by mouth once a day 8)  Hydrocodone-acetaminophen 7.5-500 Mg Tabs (Hydrocodone-acetaminophen) .... Take 1 tab by mouth every 6 hours as needed for pain. 9)  Furosemide 20 Mg Tabs (Furosemide) .... Take 1 tablet by mouth once a day  Patient Instructions: 1)  You will start a new medicine today: Furosemide 20mg  by mouth once daily, this is for your BP. 2)  You will have somes labs checked today. I will call you if there is anything in the results that needs to be addressed.  3)  Tobacco is very bad for your health and your loved ones! You Should stop smoking!. 4)  Stop Smoking Tips: Choose a Quit date. Cut down before the Quit date. decide what you will do as a substitute when you feel the urge to smoke(gum,toothpick,exercise). 5)  Please schedule a follow-up appointment in 1 month so I can see how your blood pressure is doing and we can make sure your labs are OK. 6)  Make sure you follow up with Dr. Gwenyth Bouillon next month as previously scheduled. Prescriptions: FUROSEMIDE 20 MG TABS (FUROSEMIDE) Take 1 tablet by mouth once a day  #30 x 3   Entered and Authorized by:   Brooks Sailors MD   Signed by:   Brooks Sailors MD on 06/29/2009   Method used:   [facility]        [facility] [address] [phone]* (retail)       [address]       [place], [place]  [address]       Ph: [phone] or [phone]       Fax: [phone]   RxID:   [id number]  Process  Orders Check Orders Results:     [facility]: Check successful Tests Sent for requisitioning (June 29, 2009 7:08 PM):     06/29/2009: [facility] -- T-Lipid Profile [phone] (signed)     06/29/2009: [facility] -- T-Basic Metabolic Panel [id number] (signed)     06/29/2009: [facility] -- T-Drug Screen-Urine, (single) [phone] (signed)    Prevention & Chronic Care Immunizations   Influenza vaccine: Fluvax MCR  (04/20/2009)   Influenza vaccine deferral: Refused  (03/13/2009)    Tetanus booster: 03/13/2009: Tdap    Pneumococcal vaccine: Not documented    H. zoster vaccine: Not documented   H. zoster vaccine deferral: Deferred  (03/13/2009)  Colorectal Screening   Hemoccult: Not documented   Hemoccult action/deferral: Not indicated  (03/13/2009)    Colonoscopy: Results: Hemorrhoids.     Location:  [facility].    (07/31/2006)   Colonoscopy action/deferral: Repeat colonoscopy in 5 years.   (07/31/2006)   Colonoscopy due: 07/2011  Other Screening   PSA: Not documented   PSA action/deferral: Discussion deferred  (03/13/2009)   Smoking status: current  (06/29/2009)   Smoking cessation counseling: yes  (06/29/2009)  Lipids   Total Cholesterol: 172  (02/07/2009)   Lipid panel action/deferral: Lipid Panel ordered   LDL: 104  (02/07/2009)   LDL Direct: Not documented   HDL: 49  (02/07/2009)   Triglycerides: 94  (02/07/2009)    SGOT (AST): 23  (09/15/2008)   SGPT (ALT): 16  (09/15/2008)   Alkaline phosphatase: 85  (09/15/2008)   Total bilirubin: 0.4  (09/15/2008)    Lipid flowsheet reviewed?: Yes   Progress toward LDL goal: Unchanged  Hypertension   Last Blood Pressure: 155 / 85  (06/29/2009)   Serum creatinine: 1.43  (02/07/2009)   BMP action: Ordered   Serum potassium 4.1  (02/07/2009)    Hypertension flowsheet reviewed?: Yes   Progress toward BP goal:  Improved  Self-Management Support :   Personal Goals (by the next clinic visit) :  Personal blood pressure goal: 140/90  (03/13/2009)     Personal LDL goal: 100  (03/13/2009)    Patient will work on the following items until the next clinic visit to reach self-care goals:     Medications and monitoring: take my medicines every day  (06/29/2009)     Eating: drink diet soda or water instead of juice or soda, eat more vegetables, use fresh or frozen vegetables, eat foods that are low in salt, eat baked foods instead of fried foods  (06/29/2009)     Activity: take a 30 minute walk every day  (06/29/2009)    Hypertension self-management support: Written self-care plan  (06/29/2009)   Hypertension self-care plan printed.    Lipid self-management support: Written self-care plan  (06/29/2009)   Lipid self-care plan printed.  Appended Document: Orders Update    Clinical Lists Changes  Orders: Added new Service order of Est. Patient Level IV ([address]) - Signed

## 2010-07-17 NOTE — Progress Notes (Signed)
Summary: Refill/gh  Phone Note Refill Request Message from:  Fax from Pharmacy on November 24, 2009 5:20 PM  Refills Requested: Medication #1:  HYDROCODONE-ACETAMINOPHEN 7.5-500 MG TABS Take 1 tab by mouth every 6 hours as needed for pain.   Last Refilled: 10/27/2009  Method Requested: Electronic Initial call taken by: Angelina Ok RN,  November 24, 2009 5:20 PM  Follow-up for Phone Call        Refill approved for 10 tablets only-nurse to complete.  I do not know this patient, and i have never seen him before, he will need to come into the clinic for a f/u and UDS, and to reevaluate need for chronic narcotics.   Thanks Follow-up by: Darnelle Maffucci MD,  November 27, 2009 7:58 AM  Additional Follow-up for Phone Call Additional follow up Details #1::        Rx called to pharmacy.  Message left with the pharmacist to have pt to call the Clinics.  Message to scheduler for an appointment. Angelina Ok RN  November 27, 2009 12:41 PM  Additional Follow-up by: Angelina Ok RN,  November 27, 2009 12:41 PM    Prescriptions: HYDROCODONE-ACETAMINOPHEN 7.5-500 MG TABS (HYDROCODONE-ACETAMINOPHEN) Take 1 tab by mouth every 6 hours as needed for pain.  #10 x 0   Entered and Authorized by:   Darnelle Maffucci MD   Signed by:   Darnelle Maffucci MD on 11/27/2009   Method used:   Telephoned to ...       [facility] [address] [facility] [address] [phone]* (retail)       [address]       [place], [place]  [address]       Ph: [phone] or [phone]       Fax: [phone]   RxID:   [id number]   Appended Document: Refill/gh Pt.had called about Hydrocodone refill, which has been called to the pharmacy. Pt. was informed that he needs to schedule an appt.  He has an appt. January 01, 2010 w/Dr.Kataleia Quaranta.

## 2010-07-17 NOTE — Progress Notes (Signed)
Summary: med refill/gp  Phone Note Refill Request Message from:  Pharmacy on August 31, 2009 9:00 AM  Refills Requested: Medication #1:  HYDROCODONE-ACETAMINOPHEN 7.5-500 MG TABS Take 1 tab by mouth every 6 hours as needed for pain.   Last Refilled: 08/04/2009  Method Requested: Telephone to Pharmacy Initial call taken by: Chinita Pester RN,  August 31, 2009 9:00 AM  Follow-up for Phone Call        Needs to be called in.  Thanks. Follow-up by: Brooks Sailors MD,  August 31, 2009 2:23 PM  Additional Follow-up for Phone Call Additional follow up Details #1::        [facility]  [address] Additional Follow-up by: Chinita Pester RN,  August 31, 2009 2:27 PM    Prescriptions: HYDROCODONE-ACETAMINOPHEN 7.5-500 MG TABS (HYDROCODONE-ACETAMINOPHEN) Take 1 tab by mouth every 6 hours as needed for pain.  #120 x 2   Entered and Authorized by:   Brooks Sailors MD   Signed by:   Brooks Sailors MD on 08/31/2009   Method used:   Telephoned to ...       [facility] [address] [phone]* (retail)       [address]       [place], [place]  [address]       Ph: [phone] or [phone]       Fax: [phone]   RxID:   [id number]

## 2010-07-17 NOTE — Assessment & Plan Note (Signed)
Summary: follow up discuss EGD: Barrett's   History of Present Illness Visit Type: follow up  Primary GI MD: Stan Head MD [facility] Primary Provider: Brooks Sailors, MD  Requesting Provider: n/a Chief Complaint: Recall EGD History of Present Illness:   65 year old African American man with known Barrett's esophagus, short segment. His last endoscopy was in 2007. He is not having any active GI symptoms at this time. He has not had any recurrence of his squamous cell carcinoma of the lung so far, diagnosed in 2007.   GI Review of Systems      Denies abdominal pain, acid reflux, belching, bloating, chest pain, dysphagia with liquids, dysphagia with solids, heartburn, loss of appetite, nausea, vomiting, vomiting blood, weight loss, and  weight gain.        Denies anal fissure, black tarry stools, change in bowel habit, constipation, diarrhea, diverticulosis, fecal incontinence, heme positive stool, hemorrhoids, irritable bowel syndrome, jaundice, light color stool, liver problems, rectal bleeding, and  rectal pain.    Current Medications (verified): 1)  Omeprazole 20 Mg  Cpdr (Omeprazole) .... Take 1 Tablet By Mouth Two Times A Day 2)  Aspirin 325 Mg Tabs (Aspirin) .... Take 1 Tablet By Mouth Once A Day 3)  Colchicine 0.6 Mg Tabs (Colchicine) .... Take 1 Tablet By Mouth Two Times A Day As Needed For Gout Flare Up 4)  Indomethacin 25 Mg Caps (Indomethacin) .... Take 1-2 Capsules By Mouth Up To Three Times A Day As Needed For Gout Pain 5)  Lopressor 50 Mg Tabs (Metoprolol Tartrate) .... Take Half Tablet By Mouth Two Times A Day 6)  Norvasc 10 Mg Tabs (Amlodipine Besylate) .... Take 1 Tablet By Mouth Once A Day 7)  Crestor 10 Mg  Tabs (Rosuvastatin Calcium) .... Take 1 Tablet By Mouth Once A Day 8)  Hydrocodone-Acetaminophen 7.5-500 Mg Tabs (Hydrocodone-Acetaminophen) .... Take 1 Tab By Mouth Every 6 Hours As Needed For Pain. 9)  Furosemide 20 Mg Tabs (Furosemide) .... Take 1 Tablet By Mouth Once A  Day  Allergies (verified): 1)  ! Atacand Hct (Candesartan Cilexetil-Hctz) 2)  ! Hydrochlorothiazide (Hydrochlorothiazide)  Past History:  Past Medical History: Reviewed history from 03/13/2007 and no changes required. 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 Lung-lobectomy, RUL, 11/07  Family History: M 83 DM F 30s? unknown 4 sibs - 1 bro with arthritis, 1 sis with htn, heart disease No FH of Colon Cancer:  Social History: Single--Divorced  No childern Current Smoker Alcohol use-not frequently, maybe once a week Retired Corporate investment banker Regular exercise-yes Drug use-no  Vital Signs:  Patient profile:   65 year old male Height:      74 inches Weight:      212 pounds BMI:     27.32 BSA:     2.23 Pulse rate:   64 / minute Pulse rhythm:   regular BP sitting:   142 / 80  (left arm) Cuff size:   regular  Vitals Entered By: Ok Anis CMA (July 13, 2009 3:19 PM)  Physical Exam  General:  alert, well-developed, and well-nourished.   Lungs:  Clear throughout to auscultation. Heart:  Regular rate and rhythm; no murmurs, rubs,  or bruits. Abdomen:  BS+ soft and nontender   Impression &  Recommendations:  Problem # 1:  BARRETT'S ESOPHAGUS (ICD-530.85) Assessment Unchanged  Orders: EGD (EGD) for surveillance Continue PPI (omperazole)  Patient Instructions: 1)  We will see you at your procedure on 07/27/09. 2)  [facility] Patient Information Guide given to patient.  3)  Upper Endoscopy brochure given.  4)  Copy sent to : Brooks Sailors, MD 5)  The medication list was reviewed and reconciled.   All changed / newly prescribed medications were explained.  A complete medication list was provided to the patient / caregiver.

## 2010-07-17 NOTE — Procedures (Signed)
Summary: Colonoscopy:    Colonoscopy  Procedure date:  10/25/2004  Findings:      Location:  [facility].   Patient Name: [name], [name]. MRN:  Procedure Procedures: Colonoscopy CPT: [phone].    with polypectomy. CPT: A3573898.  Personnel: Endoscopist: Iva Boop, MD, [facility].  Referred By: Catalina Pizza, M.D.  Exam Location: Exam performed in Outpatient Clinic. Outpatient  Patient Consent: Procedure, Alternatives, Risks and Benefits discussed, consent obtained, from patient. Consent was obtained by the RN.  Indications  Evaluation of: Positive fecal occult blood test per home screening.  History  Current Medications: Patient is taking an non-steroidal medication. Patient is not currently taking Coumadin.  Comments: Plavix held Pre-Exam Physical: Performed Oct 25, 2004. Cardio-pulmonary exam, Rectal exam, HEENT exam , Mental status exam WNL.  Comments: History reviewed/updated and physical completed prior to sedation. CBC is normal. Exam Exam: Extent of exam reached: Cecum, extent intended: Cecum.  The cecum was identified by appendiceal orifice and IC valve. Patient position: left side to back. Colon retroflexion performed. Images taken. ASA Classification: III. Tolerance: good.  Monitoring: Pulse and BP monitoring, Oximetry used. Supplemental O2 given.  Colon Prep Used Half-Lytely for colon prep. Prep results: excellent.  Sedation Meds: Patient assessed and found to be appropriate for moderate (conscious) sedation. Fentanyl 75 mcg. given IV. Versed 7 mg. given IV.  Findings - NORMAL EXAM: Ascending Colon to Descending Colon.  POLYP: Cecum, Maximum size: 5 mm. sessile polyp. Procedure:  snare without cautery, removed, retrieved, Polyp sent to pathology. ICD9: Neoplasia, Benign, Large Bowel: 211.3.  - DIVERTICULOSIS: Sigmoid Colon. ICD9: Diverticulosis, Colon: 562.10.  MULTIPLE POLYPS: Sigmoid Colon. minimum size 9 mm, maximum size 12  mm. Procedure:  snare with cautery, removed, retrieved, 2 polyps ICD9: Neoplasia, Benign, Large Bowel: 211.3.  NORMAL EXAM: Rectum.   Assessment Abnormal examination, see findings above.  Diagnoses: 211.3: Neoplasia, Benign, Large Bowel.  562.10: Diverticulosis, Colon.   Comments: 3 polyps removed. These could cause his heme + stools. Events  Unplanned Interventions: No intervention was required.  Plans  Post Exam Instructions: No aspirin or non-steroidal containing medications: 2 weeks. Restart medications: Plavix in 1 week.  Patient Education: Patient given standard instructions for: Polyps. Diverticulosis.  Disposition: After procedure patient sent to recovery. After recovery patient sent home.  Scheduling/Referral: Colonoscopy, to Iva Boop, MD, [facility], likely in 3 yrs,  Path Letter, to The Patient, re: results,  EGD, to Iva Boop, MD, [facility], next,   CC:   Dwana Melena, MD  This report was created from the original endoscopy report, which was reviewed and signed by the above listed endoscopist.

## 2010-07-17 NOTE — Letter (Signed)
Summary: EGD Instructions  [facility]  [address] [place], [place] [address]   Phone: [phone]  Fax: [phone]       [name]    November 18, 1945    MRN: [id number]       Procedure Day /Date: Thursday, 07/27/09     Arrival Time:  2:30pm     Procedure Time: 3:30 pm     Location of Procedure:                    _X_ [facility] (4th Floor)  PREPARATION FOR ENDOSCOPY   On Thursday, 07/27/09 THE DAY OF THE PROCEDURE:  1.   No solid foods, milk or milk products are allowed after midnight the night before your procedure.  2.   Do not drink anything colored red or purple.  Avoid juices with pulp.  No orange juice.  3.  You may drink clear liquids until 1:30pm, which is 2 hours before your procedure.                                                                                                CLEAR LIQUIDS INCLUDE: Water Jello Ice Popsicles Tea (sugar ok, no milk/cream) Powdered fruit flavored drinks Coffee (sugar ok, no milk/cream) Gatorade Juice: apple, white grape, white cranberry  Lemonade Clear bullion, consomm, broth Carbonated beverages (any kind) Strained chicken noodle soup Hard Candy   MEDICATION INSTRUCTIONS  Unless otherwise instructed, you should take regular prescription medications with a small sip of water as early as possible the morning of your procedure.  Additional medication instructions: None             OTHER INSTRUCTIONS  You will need a responsible adult at least 65 years of age to accompany you and drive you home.   This person must remain in the waiting room during your procedure.  Wear loose fitting clothing that is easily removed.  Leave jewelry and other valuables at home.  However, you may wish to bring a book to read or an iPod/MP3 player to listen to music as you wait for your procedure to start.  Remove all body piercing jewelry and leave at home.  Total time from sign-in until discharge is  approximately 2-3 hours.  You should go home directly after your procedure and rest.  You can resume normal activities the day after your procedure.  The day of your procedure you should not:   Drive   Make legal decisions   Operate machinery   Drink alcohol   Return to work  You will receive specific instructions about eating, activities and medications before you leave.    The above instructions have been reviewed and explained to me by   _______________________    I fully understand and can verbalize these instructions _____________________________ Date _________

## 2010-07-17 NOTE — Procedures (Signed)
Summary: Upper Endoscopy  Patient: [name] Note: All result statuses are Final unless otherwise noted.  Tests: (1) Upper Endoscopy (EGD)   EGD Upper Endoscopy       DONE      [facility]     [address]     [place], [place]  [address]           ENDOSCOPY PROCEDURE REPORT           PATIENT:  [name], [name]  MR#:  [id number]     BIRTHDATE:  09/02/1945, 63 yrs. old  GENDER:  male           ENDOSCOPIST:  Iva Boop, MD, [facility]           PROCEDURE DATE:  07/27/2009     PROCEDURE:  EGD with biopsy     ASA CLASS:  Class II     INDICATIONS:  h/o Barrett's Esophagus - seen at EGD 2006 and 2007,     for surveillance           MEDICATIONS:   Fentanyl IV, Versed 8 mg IV     TOPICAL ANESTHETIC:  Exactacain Spray           DESCRIPTION OF PROCEDURE:   After the risks benefits and     alternatives of the procedure were thoroughly explained, informed     consent was obtained.  The [facility] GIF-[id number] [id number] endoscope was     introduced through the mouth and advanced to the second portion of     the duodenum, without limitations.  The instrument was slowly     withdrawn as the mucosa was fully examined.     <<PROCEDUREIMAGES>>           Abnormal appearing mucosa in the distal esophagus. ? inflammatory     changes n distal esophagus, above known short Barrett's Multiple     biopsies were obtained and sent to pathology.  Barrett's esophagus     was found in the distal esophagus. One short tongue, 5 mm max.     With standard forceps, a biopsy was obtained and sent to     pathology.  A hiatal hernia was found. It was 2 cm in size.     Sliding  Otherwise the examination was normal.    Retroflexed     views revealed no abnormalities.    The scope was then withdrawn     from the patient and the procedure completed.           COMPLICATIONS:  None           ENDOSCOPIC IMPRESSION:     1) Abnormal mucosa in the distal esophagus     2) Short-segment Barrett's esophagus in the  distal esophagus -     biopsied     3) 2 cm hiatal hernia     4) Otherwise normal examination     RECOMMENDATIONS:     1) await biopsy results           REPEAT EXAM:  In for EGD, pending biopsy results.           Iva Boop, MD, [facility]           CC:  Brooks Sailors, MD     The Patient     Si Gaul, MD           n.     eSIGNED:   Iva Boop at 07/27/2009 03:23 PM  [place], [place], [phone]  Note: An exclamation mark (!) indicates a result that was not dispersed into the flowsheet. Document Creation Date: 07/27/2009 3:23 PM _______________________________________________________________________  (1) Order result status: Final Collection or observation date-time: 07/27/2009 15:07 Requested date-time:  Receipt date-time:  Reported date-time:  Referring Physician:   Ordering Physician: Stan Head [phone]) Specimen Source:  Source: Launa Grill Order Number: [id number] Lab site:   Appended Document: Upper Endoscopy     Procedures Next Due Date:    EGD: 07/2012

## 2010-07-17 NOTE — Assessment & Plan Note (Signed)
Summary: REASSIGNED NEW TO DR/CFB   Vital Signs:  Patient profile:   65 year old male Height:      74 inches (187.96 cm) Weight:      207.6 pounds (94.36 kg) BMI:     26.75 Temp:     97.8 degrees F (36.56 degrees C) oral Pulse rate:   82 / minute BP sitting:   124 / 86  (left arm)  Vitals Entered By: Stanton Kidney Ditzler RN (January 01, 2010 1:41 PM) Is Patient Diabetic? No Pain Assessment Patient in pain? no      Nutritional Status BMI of 25 - 29 = overweight Nutritional Status Detail appetite good  Have you ever been in a relationship where you felt threatened, hurt or afraid?denies   Does patient need assistance? Functional Status Self care Ambulation Normal Comments FU and discuss refilling gout med.   Primary Care Provider:  Brooks Sailors, MD    History of Present Illness: 65 yo m, presents to the outpatient clinic for followup. He would like a refill on his gout and arthritis medications. Pt's pain is well controlled with his current pain regimen.  He takes his vicodin 4-6 times a day depending on his pain level. last month has first gout in 18 months, currently denies any complains but would like a refill for his next gout attack.   He is taking his BP medicines daily.  He does not take his BP at home or at the pharmacy.   He takes his statin as scheduled. He is still smoking but has cut down to  ~1 pack/week.  Pt denies fever, chills, or any other complaints, and otherwise doing well.    Depression History:      The patient denies a depressed mood most of the day and a diminished interest in his usual daily activities.  The patient denies significant weight loss, significant weight gain, insomnia, hypersomnia, psychomotor agitation, psychomotor retardation, fatigue (loss of energy), feelings of worthlessness (guilt), impaired concentration (indecisiveness), and recurrent thoughts of death or suicide.         Preventive Screening-Counseling & Management  Alcohol-Tobacco     Alcohol type: Liquor sometimes     Smoking Status: current     Smoking Cessation Counseling: yes     Packs/Day: 1 ppweek     Year Started: age 65  Caffeine-Diet-Exercise     Does Patient Exercise: yes     Type of exercise: WALKING     Times/week: AT TIMES  Current Medications (verified): 1)  Omeprazole 20 Mg  Cpdr (Omeprazole) .... Take 1 Tablet By Mouth Two Times A Day 2)  Aspirin 325 Mg Tabs (Aspirin) .... Take 1 Tablet By Mouth Once A Day 3)  Colchicine 0.6 Mg Tabs (Colchicine) .... Take 1 Tablet By Mouth Two Times A Day As Needed For Gout Flare Up 4)  Indomethacin 25 Mg Caps (Indomethacin) .... Take 1-2 Capsules By Mouth Up To Three Times A Day As Needed For Gout Pain 5)  Lopressor 50 Mg Tabs (Metoprolol Tartrate) .... Take Half Tablet By Mouth Two Times A Day 6)  Norvasc 10 Mg Tabs (Amlodipine Besylate) .... Take 1 Tablet By Mouth Once A Day 7)  Crestor 10 Mg  Tabs (Rosuvastatin Calcium) .... Take 1 Tablet By Mouth Once A Day 8)  Hydrocodone-Acetaminophen 7.5-500 Mg Tabs (Hydrocodone-Acetaminophen) .... Take 1 Tab By Mouth Every 6 Hours As Needed For Pain. 9)  Furosemide 20 Mg Tabs (Furosemide) .... Take 1 Tablet By Mouth Once A Day  Allergies: 1)  ! Atacand Hct (Candesartan Cilexetil-Hctz) 2)  ! Hydrochlorothiazide (Hydrochlorothiazide)  Social History: Packs/Day:  1 ppweek  Review of Systems       Per HPI  Physical Exam  General:  alert, well-developed, and well-nourished.   Mouth:  no gingival abnormalities and pharynx pink and moist.   Neck:  supple.   Lungs:  normal respiratory effort, normal breath sounds, no dullness, no crackles, and no wheezes.   Heart:  normal rate, regular rhythm, no murmur, no gallop, no rub, and no JVD.   Abdomen:  soft, non-tender, normal bowel sounds, no distention, no masses, and no guarding.   Msk:  negative SLR bilat.  no ttp on left hip Pulses:  2+ DP/PT pulses bilaterally Extremities:  no edema Neurologic:  alert & oriented X3,  cranial nerves II-XII intact, strength normal in all extremities, and sensation intact to light touch.     Impression & Recommendations:  Problem # 1:  ARTHRITIS, LEFT HIP (ICD-716.95) Assessment Comment Only Patient on chronic narcotics, has pain contract signed, will get UDS today. and give him script for his opiates. however I would like to overall taper him and try alternative treatment. will bring back in 3 months for another UDS.  Orders: T-Drug Screen-Urine, (single) [id number])  Problem # 2:  TOBACCO ABUSE (ICD-305.1) Assessment: Comment Only Patient was counseled on smoking cessation strategies including medications and behavior modification options. Patient said she was not ready to stop smoking at this time.    Problem # 3:  GOUT (ICD-274.9) denies any complaints, would like script incase he has another gout attack.   His updated medication list for this problem includes:    Colchicine 0.6 Mg Tabs (Colchicine) .Marland Kitchen... Take 1 tablet by mouth two times a day as needed for gout flare up  Problem # 4:  HYPERTENSION (ICD-401.9) Well controlled on current treatment, No new changes made today, Will continue to monitor.   His updated medication list for this problem includes:    Lopressor 50 Mg Tabs (Metoprolol tartrate) .Marland Kitchen... Take half tablet by mouth two times a day    Norvasc 10 Mg Tabs (Amlodipine besylate) .Marland Kitchen... Take 1 tablet by mouth once a day    Furosemide 20 Mg Tabs (Furosemide) .Marland Kitchen... Take 1 tablet by mouth once a day  Problem # 5:  HYPERLIPIDEMIA (ICD-272.4) Assessment: Comment Only Well controlled on current treatment, No new changes made today, Will continue to monitor.   His updated medication list for this problem includes:    Crestor 10 Mg Tabs (Rosuvastatin calcium) .Marland Kitchen... Take 1 tablet by mouth once a day  Labs Reviewed: SGOT: 23 (09/15/2008)   SGPT: 16 (09/15/2008)  Prior 10 Yr Risk Heart Disease: N/A (01/28/2007)   HDL:54 (06/30/2009), 49 (02/07/2009)   LDL:124 (06/30/2009), 104 (02/07/2009)  Chol:193 (06/30/2009), 172 (02/07/2009)  Trig:76 (06/30/2009), 94 (02/07/2009)  Complete Medication List: 1)  Omeprazole 20 Mg Cpdr (Omeprazole) .... Take 1 tablet by mouth two times a day 2)  Aspirin 325 Mg Tabs (Aspirin) .... Take 1 tablet by mouth once a day 3)  Colchicine 0.6 Mg Tabs (Colchicine) .... Take 1 tablet by mouth two times a day as needed for gout flare up 4)  Indomethacin 25 Mg Caps (Indomethacin) .... Take 1-2 capsules by mouth up to three times a day as needed for gout pain 5)  Lopressor 50 Mg Tabs (Metoprolol tartrate) .... Take half tablet by mouth two times a day 6)  Norvasc 10 Mg Tabs (Amlodipine besylate) .Marland KitchenMarland KitchenMarland Kitchen  Take 1 tablet by mouth once a day 7)  Crestor 10 Mg Tabs (Rosuvastatin calcium) .... Take 1 tablet by mouth once a day 8)  Hydrocodone-acetaminophen 7.5-500 Mg Tabs (Hydrocodone-acetaminophen) .... Take 1 tab by mouth every 6 hours as needed for pain. 9)  Furosemide 20 Mg Tabs (Furosemide) .... Take 1 tablet by mouth once a day  Patient Instructions: 1)  Please schedule a follow-up appointment in 3 months. Prescriptions: HYDROCODONE-ACETAMINOPHEN 7.5-500 MG TABS (HYDROCODONE-ACETAMINOPHEN) Take 1 tab by mouth every 6 hours as needed for pain.  #90 x 0   Entered and Authorized by:   Darnelle Maffucci MD   Signed by:   Darnelle Maffucci MD on 01/01/2010   Method used:   Print then Give to Patient   RxID:   [id number] INDOMETHACIN 25 MG CAPS (INDOMETHACIN) Take 1-2 capsules by mouth up to three times a day as needed for gout pain  #30 x 0   Entered and Authorized by:   Darnelle Maffucci MD   Signed by:   Darnelle Maffucci MD on 01/01/2010   Method used:   Print then Give to Patient   RxID:   [id number] COLCHICINE 0.6 MG TABS (COLCHICINE) Take 1 tablet by mouth two times a day as needed for gout flare up  #30 x 0   Entered and Authorized by:   Darnelle Maffucci MD   Signed by:   Darnelle Maffucci MD on 01/01/2010   Method used:    Print then Give to Patient   RxID:   [id number]  Process Orders Check Orders Results:     [facility]: Check successful Tests Sent for requisitioning (January 01, 2010 5:06 PM):     01/01/2010: [facility] -- T-Drug Screen-Urine, (single) [[id number]] (signed)    Prevention & Chronic Care Immunizations   Influenza vaccine: Fluvax MCR  (04/20/2009)   Influenza vaccine deferral: Refused  (03/13/2009)    Tetanus booster: 03/13/2009: Tdap    Pneumococcal vaccine: Not documented    H. zoster vaccine: Not documented   H. zoster vaccine deferral: Deferred  (03/13/2009)  Colorectal Screening   Hemoccult: Not documented   Hemoccult action/deferral: Not indicated  (03/13/2009)    Colonoscopy: Results: Hemorrhoids.     Location:  [facility].    (07/31/2006)   Colonoscopy action/deferral: Repeat colonoscopy in 5 years.   (07/31/2006)   Colonoscopy due: 07/2011  Other Screening   PSA: Not documented   PSA action/deferral: Discussion deferred  (03/13/2009)   Smoking status: current  (01/01/2010)   Smoking cessation counseling: yes  (01/01/2010)  Lipids   Total Cholesterol: 193  (06/30/2009)   Lipid panel action/deferral: Lipid Panel ordered   LDL: 124  (06/30/2009)   LDL Direct: Not documented   HDL: 54  (06/30/2009)   Triglycerides: 76  (06/30/2009)    SGOT (AST): 23  (09/15/2008)   BMP action: Deferred   SGPT (ALT): 16  (09/15/2008)   Alkaline phosphatase: 85  (09/15/2008)   Total bilirubin: 0.4  (09/15/2008)    Lipid flowsheet reviewed?: Yes   Progress toward LDL goal: Unchanged  Hypertension   Last Blood Pressure: 124 / 86  (01/01/2010)   Serum creatinine: 1.26  (06/30/2009)   BMP action: Ordered   Serum potassium 4.2  (06/30/2009)    Hypertension flowsheet reviewed?: Yes   Progress toward BP goal: At goal  Self-Management Support :   Personal Goals (by the next clinic visit) :      Personal blood pressure  goal: 140/90  (  03/13/2009)     Personal LDL goal: 100  (03/13/2009)    Patient will work on the following items until the next clinic visit to reach self-care goals:     Medications and monitoring: take my medicines every day, bring all of my medications to every visit  (01/01/2010)     Eating: eat more vegetables, use fresh or frozen vegetables, eat baked foods instead of fried foods, eat fruit for snacks and desserts  (01/01/2010)     Activity: take a 30 minute walk every day  (01/01/2010)    Hypertension self-management support: Written self-care plan, Education handout, Resources for patients handout  (01/01/2010)   Hypertension self-care plan printed.   Hypertension education handout printed    Lipid self-management support: Written self-care plan, Education handout, Resources for patients handout  (01/01/2010)   Lipid self-care plan printed.   Lipid education handout printed      Resource handout printed.

## 2010-07-17 NOTE — Progress Notes (Signed)
Summary: refill/ hla  Phone Note Refill Request Message from:  Fax from Pharmacy on May 01, 2010 3:07 PM  Refills Requested: Medication #1:  HYDROCODONE-ACETAMINOPHEN 7.5-500 MG TABS Take 1 tab by mouth every 6 hours as needed for pain.   Dosage confirmed as above?Dosage Confirmed   Last Refilled: 10/17  Method Requested: Telephone to Pharmacy Initial call taken by: Marin Roberts RN,  May 01, 2010 3:07 PM  Follow-up for Phone Call        Refill approved-nurse to complete Follow-up by: Darnelle Maffucci MD,  May 03, 2010 3:25 PM  Additional Follow-up for Phone Call Additional follow up Details #1::        Rx called to pharmacy Additional Follow-up by: Merrie Roof RN,  May 03, 2010 3:51 PM    Prescriptions: HYDROCODONE-ACETAMINOPHEN 7.5-500 MG TABS (HYDROCODONE-ACETAMINOPHEN) Take 1 tab by mouth every 6 hours as needed for pain.  #90 x 0   Entered and Authorized by:   Darnelle Maffucci MD   Signed by:   Darnelle Maffucci MD on 05/03/2010   Method used:   Telephoned to ...       [facility] [address] [phone]* (retail)       [address]       [place], [place]  [address]       Ph: [phone] or [phone]       Fax: [phone]   RxID:   [id number]

## 2010-07-17 NOTE — Progress Notes (Signed)
Summary: refill/ hla  Phone Note Refill Request Message from:  Patient on January 30, 2010 2:18 PM  Refills Requested: Medication #1:  HYDROCODONE-ACETAMINOPHEN 7.5-500 MG TABS Take 1 tab by mouth every 6 hours as needed for pain.   Dosage confirmed as above?Dosage Confirmed   Last Refilled: 7/18 Initial call taken by: Marin Roberts RN,  January 30, 2010 2:19 PM  Follow-up for Phone Call        Refill approved-nurse to complete, but not until 8/18 please.  Follow-up by: Darnelle Maffucci MD,  January 30, 2010 4:01 PM  Additional Follow-up for Phone Call Additional follow up Details #1::        Rx called to pharmacy Additional Follow-up by: Marin Roberts RN,  January 31, 2010 12:04 PM    Prescriptions: HYDROCODONE-ACETAMINOPHEN 7.5-500 MG TABS (HYDROCODONE-ACETAMINOPHEN) Take 1 tab by mouth every 6 hours as needed for pain.  #90 x 0   Entered and Authorized by:   Darnelle Maffucci MD   Signed by:   Darnelle Maffucci MD on 01/30/2010   Method used:   Telephoned to ...       [facility] [phone]* (retail)       [address]       [place], [place]  [address]       Ph: [phone] or [phone]       Fax: [phone]   RxID:   [id number]

## 2010-07-17 NOTE — Letter (Signed)
[facility]   Imported By: Florinda Marker 08/17/2009 14:30:05  _____________________________________________________________________  External Attachment:    Type:   Image     Comment:   External Document

## 2010-07-17 NOTE — Progress Notes (Signed)
Summary: med refil/gp  Phone Note Refill Request Message from:  Patient on March 02, 2010 11:34 AM  Refills Requested: Medication #1:  HYDROCODONE-ACETAMINOPHEN 7.5-500 MG TABS Take 1 tab by mouth every 6 hours as needed for pain. Last appt. 01/01/10.   Method Requested: Telephone to Pharmacy Initial call taken by: Chinita Pester RN,  March 02, 2010 11:34 AM  Follow-up for Phone Call        [facility] Follow-up by: Chinita Pester RN,  March 05, 2010 8:53 AM    Prescriptions: HYDROCODONE-ACETAMINOPHEN 7.5-500 MG TABS (HYDROCODONE-ACETAMINOPHEN) Take 1 tab by mouth every 6 hours as needed for pain.  #90 x 0   Entered and Authorized by:   Darnelle Maffucci MD   Signed by:   Darnelle Maffucci MD on 03/05/2010   Method used:   Telephoned to ...       [facility] [address] [phone]* (retail)       [address]       [place], [place]  [address]       Ph: [phone] or [phone]       Fax: [phone]   RxID:   [id number]

## 2010-07-17 NOTE — Procedures (Signed)
Summary: Colonoscopy: Hemorrhoids   Colonoscopy  Procedure date:  07/31/2006  Findings:      Results: Hemorrhoids.     Location:  [facility].    Procedures Next Due Date:    Colonoscopy: 07/2011  Patient Name: [name], [name]. MRN:  Procedure Procedures: Colonoscopy CPT: [phone].  Personnel: Endoscopist: Iva Boop, MD, [facility].  Referred By: Velora Heckler. Arbutus Ped, M.D.  Exam Location: Exam performed in Outpatient Clinic. Outpatient  Patient Consent: Procedure, Alternatives, Risks and Benefits discussed, consent obtained, from patient. Consent was obtained by the RN.  Indications  Abnormal Exams, Studies: CT scan, abnormal.  Surveillance of: Adenomatous Polyp(s). This is an initial surveillance exam. Initial polypectomy was performed in 2006. in May. 3 or more Polyps were found at Index Exam. Largest polyp removed was 6 to 9 mm. Prior polyp located in both proximal and distal colon. Pathology of worst  polyp: tubular adenoma.  Comments: Abnormal cecum (thickened) on CT/PET to follow-up lung cancer History  Current Medications: Patient is not currently taking Coumadin.  Allergies: No known allergies.  Pre-Exam Physical: Performed Jul 31, 2006. Cardio-pulmonary exam, Rectal exam, HEENT exam , Abdominal exam, Mental status exam WNL.  Comments: Pt. history reviewed/updated, physical exam performed prior to initiation of sedation? YES Exam Exam: Extent of exam reached: Cecum, extent intended: Cecum.  The cecum was identified by appendiceal orifice and IC valve. Patient position: on left side. Time to Cecum: 00:02:42. Time for Withdrawl: 00:04:46. Colon retroflexion performed. Images taken. ASA Classification: II. Tolerance: excellent.  Monitoring: Pulse and BP monitoring, Oximetry used. Supplemental O2 given.  Colon Prep Used MiraLAx for colon prep. Prep results: excellent.  Sedation Meds: Patient assessed and found to be appropriate for moderate  (conscious) sedation. Fentanyl 75 mcg. given IV. Versed 8 mg. given IV.  Findings - NORMAL EXAM: Cecum to Sigmoid Colon.  HEMORRHOIDS: Internal. Size: Grade I. ICD9: Hemorrhoids, Internal: 455.0.    Comments: Previously seen diverticulosis not identified. Assessment  Diagnoses: 455.0: Hemorrhoids, Internal.   Comments: 1) NO POLYPS OR CANCER SEEN.  2) CECUM IS NORMAL 3) INTERNAL HEMORRHOIDS 4) EXCELLENT PREP Events  Unplanned Interventions: No intervention was required.  Plans Patient Education: Patient given standard instructions for: Hemorrhoids.  Disposition: After procedure patient sent to recovery. After recovery patient sent home.  Scheduling/Referral: Colonoscopy, to Iva Boop, MD, [place], 5 YRS IF HEALTHY,   CC:   Si Gaul, MD   Dwana Melena, MD  This report was created from the original endoscopy report, which was reviewed and signed by the above listed endoscopist.

## 2010-07-17 NOTE — Letter (Signed)
[facility]   Imported By: [name] 02/22/2010 09:06:23  _____________________________________________________________________  External Attachment:    Type:   Image     Comment:   External Document

## 2010-07-17 NOTE — Procedures (Signed)
Summary: EGD: PEG Placement   EGD  Procedure date:  06/06/2006  Findings:      Findings: Normal  Location: [facility]   Patient Name: [name], [name]. MRN:  Procedure Procedures: Panendoscopy (EGD) CPT: 43235.  PEG placement. CPT: 43246.  Personnel: Endoscopist: Rachael Fee, MD.  Exam Location: Exam performed in Endoscopy Suite. Inpatient-ICU  Patient Consent: Procedure, Alternatives, Risks and Benefits discussed, consent obtained, from patient. Consent was obtained by the RN.  Indications Symptoms: trached in ICU, unable to eat.  History  Current Medications: Patient is taking a non-steroidal medication. Patient is not currently taking Coumadin.  Pre-Exam Physical: Performed Jun 06, 2006  Cardio-pulmonary exam abnormal. Abdominal exam WNL. Mental status exam abnormal. Abnormal PE findings include: intubated, trached, sedated.  Comments: Pt. history reviewed/updated, physical exam performed prior to initiation of sedation? yes Exam Exam Info: Maximum depth of insertion Duodenum, intended Duodenum. Patient position: prone. Images taken. ASA Classification: III. Tolerance: excellent.  Sedation Meds: Patient assessed and found to be appropriate for moderate (conscious) sedation. Fentanyl 125 mcg. given IV. Versed 10 mg. given IV. Ancef 1gram given IV.  Monitoring: BP and pulse monitoring done. Oximetry used. Supplemental O2 given  Fluoroscopy: Fluoroscopy was not used.  Findings - Normal: Proximal Esophagus to Duodenal 2nd Portion.  OTHER FINDING: 24 Fr [employer] pull type PEG placed. in Body.  - TUBE PLACEMENT: in Body. Optimal PEG site identification made by transillumination and finger indentation. Abdomen prepped with Betadine. A 1 cm incision was made with a scalpel. Site infiltrated with 1% Lidocaine. Standard Pull PEG procedure performed. Comments: internal bumper was photographed in good position following placement.    Assessment Normal examination.  Comments: 24 Fr [employer] pull type PEG was placed. Events  Unplanned Intervention: No unplanned interventions were required.  Unplanned Events: There were no complications. Plans Comments: OK to use for meds now, do not use TFs until evaluated by GI tomorrow. Disposition: After procedure patient sent to recovery.    This report was created from the original endoscopy report, which was reviewed and signed by the above listed endoscopist.

## 2010-07-17 NOTE — Miscellaneous (Signed)
Summary: Medication Contract  Medication Contract   Imported By: Florinda Marker 07/05/2009 14:22:26  _____________________________________________________________________  External Attachment:    Type:   Image     Comment:   External Document

## 2010-07-17 NOTE — Progress Notes (Signed)
Summary: med refill/gp  Phone Note Refill Request Message from:  Fax from Pharmacy on Nov 14, 2009 12:30 PM  Refills Requested: Medication #1:  CRESTOR 10 MG  TABS Take 1 tablet by mouth once a day   Last Refilled: 11/09/2009 Last appt. Jan 2011.   Method Requested: Electronic Initial call taken by: Chinita Pester RN,  Nov 14, 2009 12:30 PM  Follow-up for Phone Call       Follow-up by: Darnelle Maffucci MD,  Nov 14, 2009 1:16 PM    Prescriptions: CRESTOR 10 MG  TABS (ROSUVASTATIN CALCIUM) Take 1 tablet by mouth once a day  #31 Tablet x 5   Entered and Authorized by:   Darnelle Maffucci MD   Signed by:   Darnelle Maffucci MD on 11/14/2009   Method used:   [facility]        [facility] [address] [phone]* (retail)       [address]       [place], [place]  [address]       Ph: [phone] or [phone]       Fax: [phone]   RxID:   [id number]

## 2010-07-17 NOTE — Progress Notes (Signed)
Summary: refill/gg  Phone Note Refill Request  on April 02, 2010 3:04 PM  Refills Requested: Medication #1:  HYDROCODONE-ACETAMINOPHEN 7.5-500 MG TABS Take 1 tab by mouth every 6 hours as needed for pain.   Last Refilled: 03/05/2010  Method Requested: Fax to Local Pharmacy Initial call taken by: Merrie Roof RN,  April 02, 2010 3:05 PM  Follow-up for Phone Call        Refill approved-nurse to complete on 10/19, thanks Follow-up by: Darnelle Maffucci MD,  April 02, 2010 3:18 PM  Additional Follow-up for Phone Call Additional follow up Details #1::        Rx called to pharmacy - [facility].  Pt. was called and made awared. Additional Follow-up by: Chinita Pester RN,  April 03, 2010 11:50 AM    Prescriptions: HYDROCODONE-ACETAMINOPHEN 7.5-500 MG TABS (HYDROCODONE-ACETAMINOPHEN) Take 1 tab by mouth every 6 hours as needed for pain.  #90 x 0   Entered and Authorized by:   Darnelle Maffucci MD   Signed by:   Darnelle Maffucci MD on 04/02/2010   Method used:   Telephoned to ...       [facility] [address] [phone]* (retail)       [address]       [place], [place]  [address]       Ph: [phone] or [phone]       Fax: [phone]   RxID:   [id number]

## 2010-07-17 NOTE — Procedures (Signed)
Summary: EGD:    EGD  Procedure date:  12/06/2005  Findings:      Location: [facility]   Patient Name: [name], [name]. MRN:  Procedure Procedures: Panendoscopy (EGD) CPT: 43235.    with biopsy(s)/brushing(s). CPT: D1846139.  Personnel: Endoscopist: Iva Boop, MD, [facility].  Exam Location: Exam performed in Outpatient Clinic. Outpatient  Patient Consent: Procedure, Alternatives, Risks and Benefits discussed, consent obtained, from patient. Consent was obtained by the RN.  Indications  Surveillance of: Barrett's Esophagus.  History  Current Medications: Patient is taking a non-steroidal medication. Antiplatelet: Plavix (Clopidrogel) 75 mg QD,  Pre-Exam Physical: Performed Oct 25, 2004  Cardio-pulmonary exam, HEENT exam, Abdominal exam, Mental status exam WNL.  Comments: Pt. history reviewed/updated, physical exam performed prior to initiation of sedation? YES Exam Exam Info: Maximum depth of insertion Duodenum, intended Duodenum. Patient position: on left side. Gastric retroflexion performed. Images taken. ASA Classification: III. Tolerance: excellent.  Sedation Meds: Patient assessed and found to be appropriate for moderate (conscious) sedation. Fentanyl 75 mcg. given IV. Versed 7 mg. given IV. Cetacaine Spray 2 sprays given aerosolized.  Monitoring: BP and pulse monitoring done. Oximetry used. Supplemental O2 given  Findings - Normal: Proximal Esophagus to Distal Esophagus.  - Normal: Fundus to Duodenal 2nd Portion.  OTHER FINDING: small columnar island in Distal Esophagus. Biopsy/Other Finding taken. Comments: could not photo well.   Assessment  Comments: ? Barrett's. Hiatal hernia not evident today. I am not convinced he has true Barrett's. Last years biopsies may have been from cardia and not esophgus. Events  Unplanned Intervention: No unplanned interventions were required.  Plans Medication(s): Continue current  medications.  Disposition: After procedure patient sent to recovery. After recovery patient sent home.  Scheduling: Path Letter, to The Patient,    CC:   Chauncey Reading, DO

## 2010-07-19 NOTE — Progress Notes (Signed)
Summary: refill/ hla  Phone Note Refill Request Message from:  Patient on June 29, 2010 12:03 PM  Refills Requested: Medication #1:  HYDROCODONE-ACETAMINOPHEN 7.5-500 MG TABS Take 1 tab by mouth every 6 hours as needed for pain.   Dosage confirmed as above?Dosage Confirmed   Last Refilled: 12/14 Initial call taken by: Marin Roberts RN,  June 29, 2010 12:03 PM  Follow-up for Phone Call        Refill approved-nurse to complete Follow-up by: Darnelle Maffucci MD,  June 29, 2010 7:52 PM  Additional Follow-up for Phone Call Additional follow up Details #1::        Rx faxed to pharmacy Additional Follow-up by: Merrie Roof RN,  July 03, 2010 11:24 AM    Prescriptions: HYDROCODONE-ACETAMINOPHEN 7.5-500 MG TABS (HYDROCODONE-ACETAMINOPHEN) Take 1 tab by mouth every 6 hours as needed for pain.  #90 x 0   Entered and Authorized by:   Darnelle Maffucci MD   Signed by:   Darnelle Maffucci MD on 06/29/2010   Method used:   Telephoned to ...       [facility] [address] [phone]* (retail)       [address]       [place], [place]  [address]       Ph: [phone] or [phone]       Fax: [phone]   RxID:   [id number]

## 2010-07-19 NOTE — Progress Notes (Signed)
Summary: refill/gg  Phone Note Refill Request  on May 30, 2010 4:53 PM  Refills Requested: Medication #1:  HYDROCODONE-ACETAMINOPHEN 7.5-500 MG TABS Take 1 tab by mouth every 6 hours as needed for pain.   Last Refilled: 05/03/2010  Method Requested: Fax to Local Pharmacy Initial call taken by: Merrie Roof RN,  May 30, 2010 4:53 PM  Follow-up for Phone Call        Refill approved-nurse to complete Follow-up by: Darnelle Maffucci MD,  May 30, 2010 5:45 PM  Additional Follow-up for Phone Call Additional follow up Details #1::        Rx faxed to pharmacy Additional Follow-up by: Merrie Roof RN,  June 01, 2010 3:43 PM    Prescriptions: HYDROCODONE-ACETAMINOPHEN 7.5-500 MG TABS (HYDROCODONE-ACETAMINOPHEN) Take 1 tab by mouth every 6 hours as needed for pain.  #90 x 0   Entered and Authorized by:   Darnelle Maffucci MD   Signed by:   Darnelle Maffucci MD on 05/30/2010   Method used:   Telephoned to ...       [facility] [address] [phone]* (retail)       [address]       [place], [place]  [address]       Ph: [phone] or [phone]       Fax: [phone]   RxID:   [id number]

## 2010-07-19 NOTE — Progress Notes (Signed)
Summary: Refill/gh  Phone Note Refill Request Message from:  Fax from Pharmacy on June 21, 2010 11:22 AM  Refills Requested: Medication #1:  CRESTOR 10 MG  TABS Take 1 tablet by mouth once a day   Last Refilled: 05/22/2010  Method Requested: Electronic Initial call taken by: Angelina Ok RN,  June 21, 2010 11:22 AM    Prescriptions: CRESTOR 10 MG  TABS (ROSUVASTATIN CALCIUM) Take 1 tablet by mouth once a day  #31 Tablet x 5   Entered and Authorized by:   Darnelle Maffucci MD   Signed by:   Darnelle Maffucci MD on 06/21/2010   Method used:   Electronically to        [facility] [address] [phone]* (retail)       [address]       [place], [place]  [address]       Ph: [phone] or [phone]       Fax: [phone]   RxID:   [id number]

## 2010-08-02 ENCOUNTER — Other Ambulatory Visit: Payer: Self-pay | Admitting: *Deleted

## 2010-08-03 ENCOUNTER — Other Ambulatory Visit: Payer: Self-pay | Admitting: *Deleted

## 2010-08-06 ENCOUNTER — Other Ambulatory Visit: Payer: Self-pay | Admitting: *Deleted

## 2010-08-06 MED ORDER — HYDROCODONE-ACETAMINOPHEN 7.5-500 MG PO TABS: 1.0000 | ORAL_TABLET | Freq: Four times a day (QID) | ORAL | Status: DC | PRN

## 2010-08-06 MED ORDER — METOPROLOL TARTRATE 50 MG PO TABS: 25.0000 mg | ORAL_TABLET | Freq: Two times a day (BID) | ORAL | Status: DC

## 2010-08-07 NOTE — Telephone Encounter (Signed)
Rx called in 

## 2010-08-13 ENCOUNTER — Encounter: Payer: Self-pay | Admitting: Internal Medicine

## 2010-08-13 ENCOUNTER — Ambulatory Visit (INDEPENDENT_AMBULATORY_CARE_PROVIDER_SITE_OTHER): Payer: PRIVATE HEALTH INSURANCE | Admitting: Internal Medicine

## 2010-08-13 VITALS — BP 129/80 | HR 95 | Temp 97.4°F | Ht 73.0 in | Wt 213.5 lb

## 2010-08-13 DIAGNOSIS — F17200 Nicotine dependence, unspecified, uncomplicated: Secondary | ICD-10-CM

## 2010-08-13 DIAGNOSIS — E785 Hyperlipidemia, unspecified: Secondary | ICD-10-CM

## 2010-08-13 DIAGNOSIS — I10 Essential (primary) hypertension: Secondary | ICD-10-CM

## 2010-08-13 DIAGNOSIS — M1610 Unilateral primary osteoarthritis, unspecified hip: Secondary | ICD-10-CM

## 2010-08-13 MED ORDER — ROSUVASTATIN CALCIUM 20 MG PO TABS: 20.0000 mg | ORAL_TABLET | Freq: Every day | ORAL | Status: DC

## 2010-08-13 NOTE — Assessment & Plan Note (Signed)
Patient is compliant with all his medication and his blood pressure is under excellent control,  Will not make any changes today

## 2010-08-13 NOTE — Progress Notes (Signed)
  Subjective:    Patient ID: [name], male    DOB: 1946/05/17, 65 y.o.   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  Patient is well and denies any new complaints,  Denies fever, chills, weight loss, nausea, vomiting,  Or diarrhea.  Patient states that he is compliant with all of his medications.    Review of Systems  All other systems reviewed and are negative.       Objective:   Physical Exam  Constitutional: He is oriented to person, place, and time. He appears well-developed and well-nourished.  HENT:  Head: Normocephalic and atraumatic.  Eyes: Pupils are equal, round, and reactive to light.  Neck: Normal range of motion. Neck supple.  Cardiovascular: Normal rate, regular rhythm and normal heart sounds.   Pulmonary/Chest: Effort normal and breath sounds normal.  Abdominal: Soft. Bowel sounds are normal.  Neurological: He is alert and oriented to person, place, and time.  Skin: Skin is warm and dry.  Psychiatric: He has a normal mood and affect.          Assessment & Plan:

## 2010-08-13 NOTE — Assessment & Plan Note (Signed)
Patient's last LDL was 109,  This is slightly above target for this patient given his cardiovascular risk factors,  Will increase his Crestor to to 20 mg daily,  Plan bring back in 3 months for repeat fasting lipid panel and liver function tests.

## 2010-08-13 NOTE — Assessment & Plan Note (Signed)
Patient counseled on smoking cessation,  Patient is not ready to quit smoking at this time.

## 2010-08-13 NOTE — Assessment & Plan Note (Signed)
Stable continue chronic opiate treatment  As needed for pain.

## 2010-08-28 ENCOUNTER — Other Ambulatory Visit: Payer: Self-pay | Admitting: *Deleted

## 2010-08-29 IMAGING — CR DG CHEST 2V
2 series · 2 of 2 positions shown · non-contrast
Comparison: 07/16/2006

CLINICAL DATA: Chest pain

CHEST - 2 VIEW

[w chest pa]
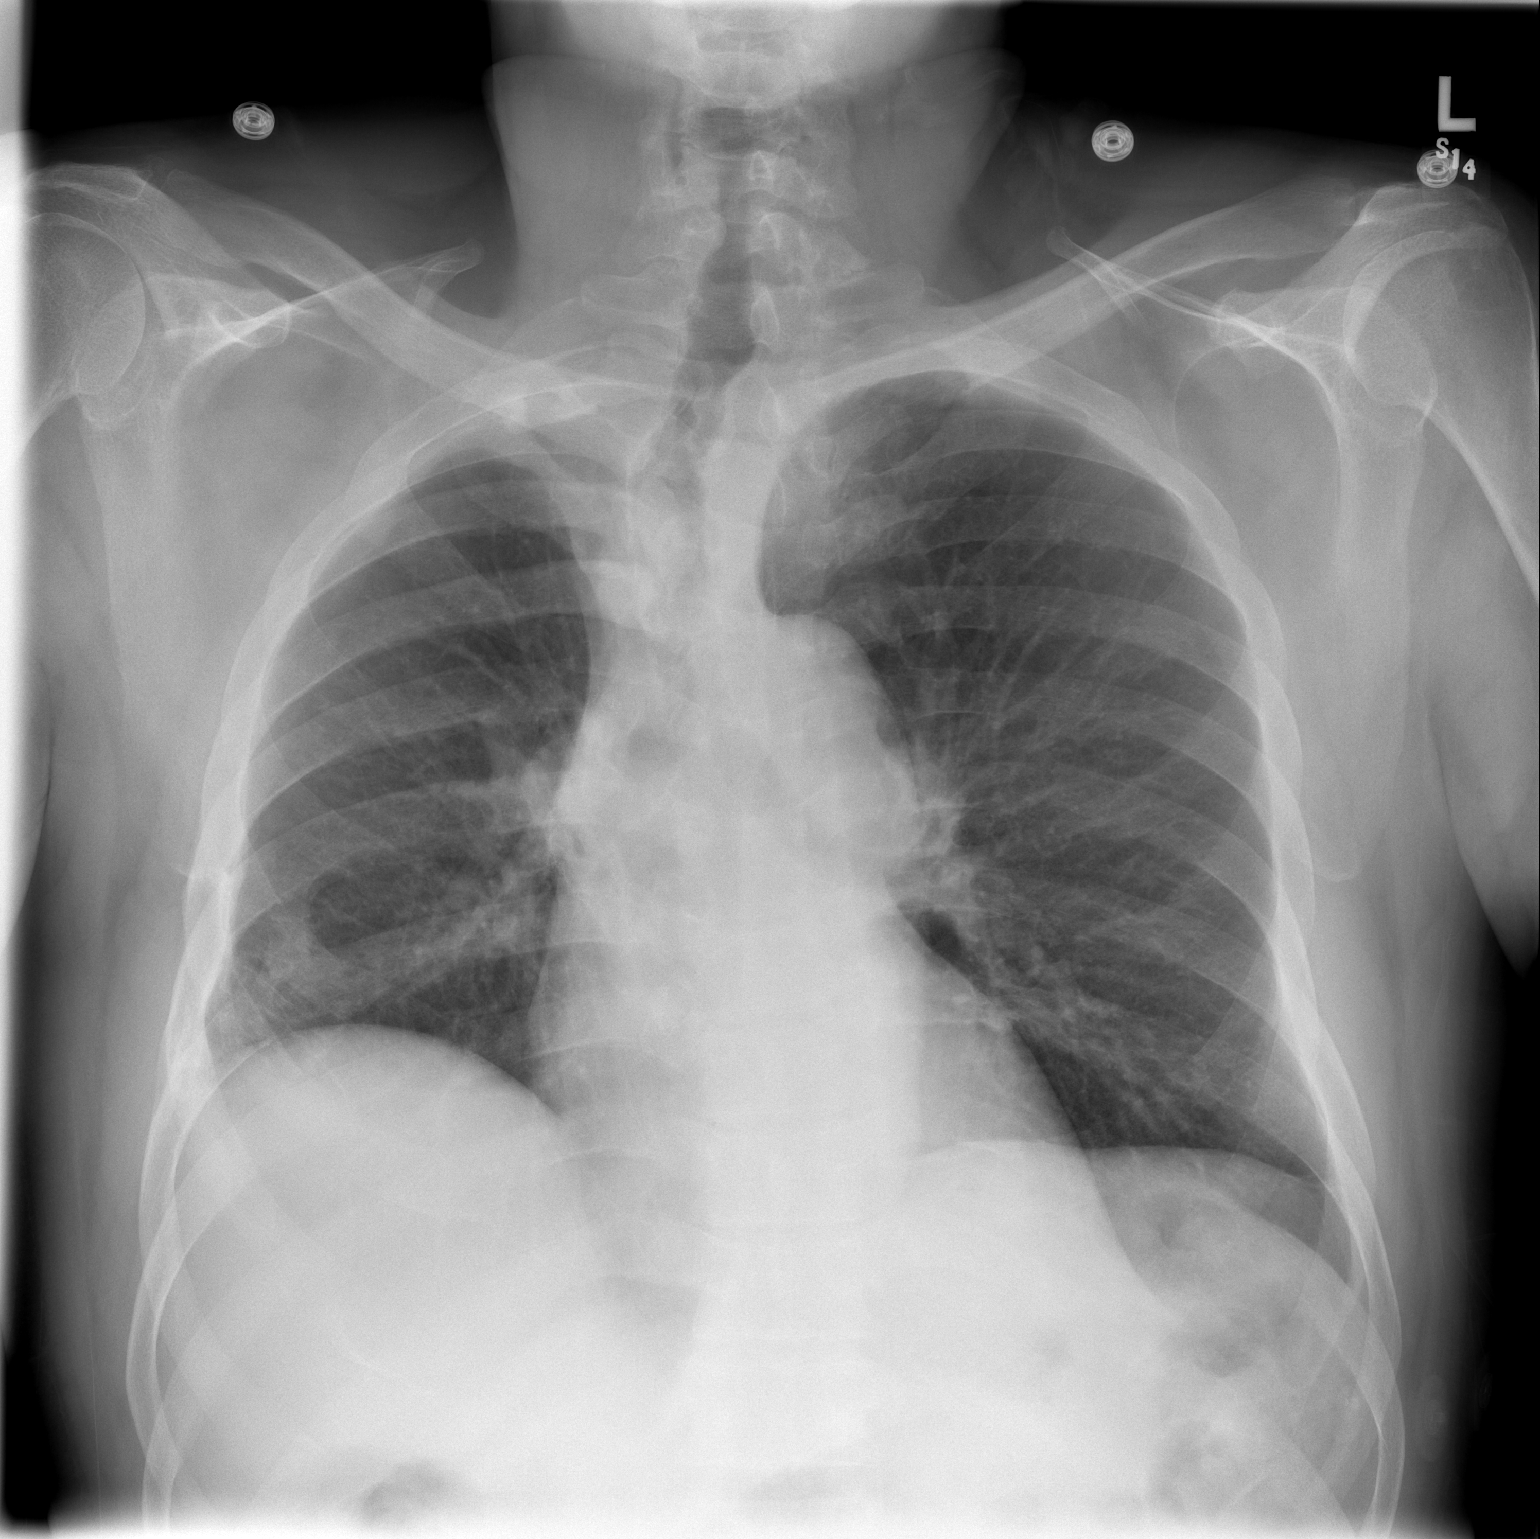

[w chest lat]
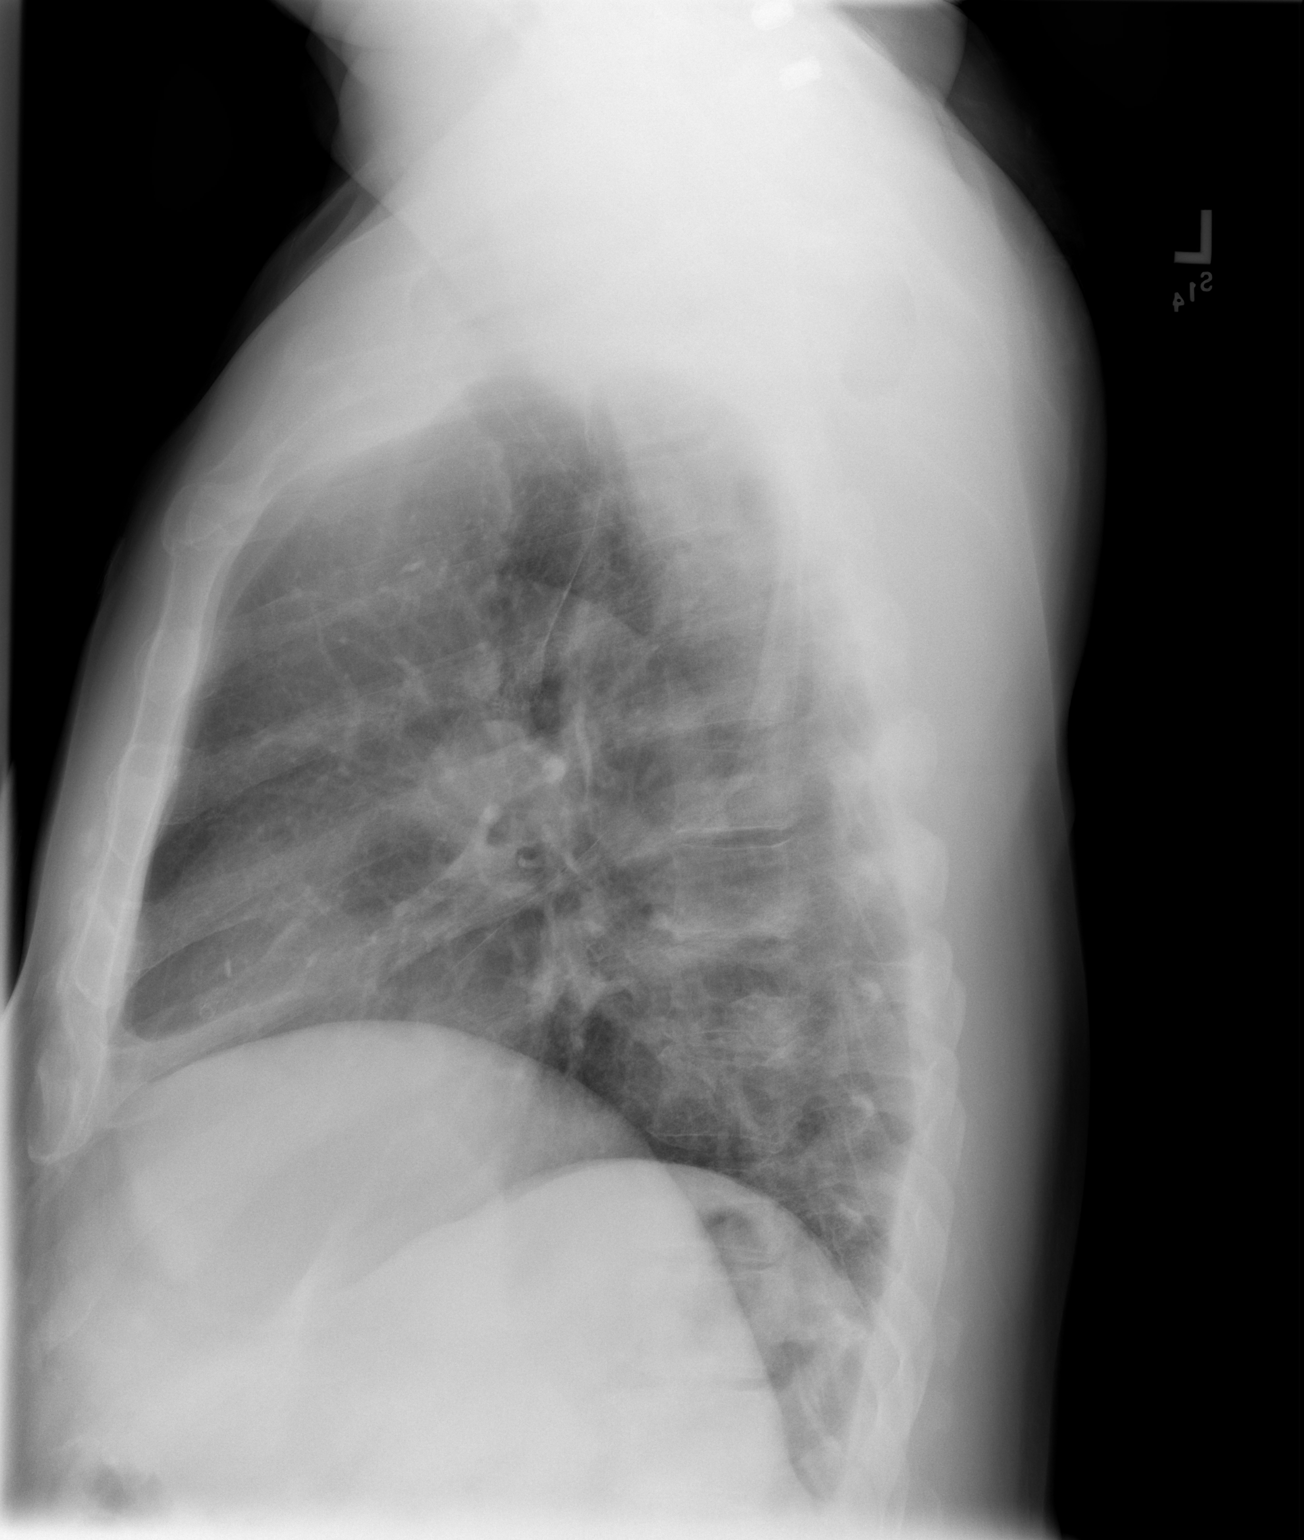

[2 of 2 positions shown; findings below may reference images not displayed]

FINDINGS: Postoperative changes with volume loss and scarring of
the right lower lobe
Again noted.  No acute infiltrate or pleural effusion.  No
pulmonary edema.  The bony thorax is stable.
IMPRESSION: No acute infiltrate or edema.
Stable postoperative changes right lower lung.

## 2010-08-29 IMAGING — CT CT EXTREM LOW W/O CM*L*
1 of 4 series · 13 of 32 positions shown, 19 images · non-contrast
Comparison: None available.

CLINICAL DATA: Left hip pain approximate 1 week.  History of lung
cancer.

CT LEFT HIP WITHOUT CONTRAST
TECHNIQUE: Multidetector CT imaging of the left hip was performed
according to the standard protocol without intravenous contrast.
Multiplanar CT image reconstructions were also generated.

[Series 3: recon 2: routine pelvis · axial · 0.47mm/px · z∈[-403,-193]mm · 13 of 197 slices shown, 19 images]
[im 15/197  soft-tissue]
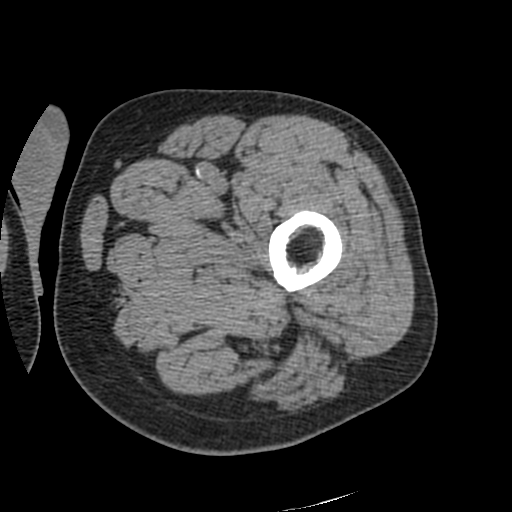
[im 15/197  bone]
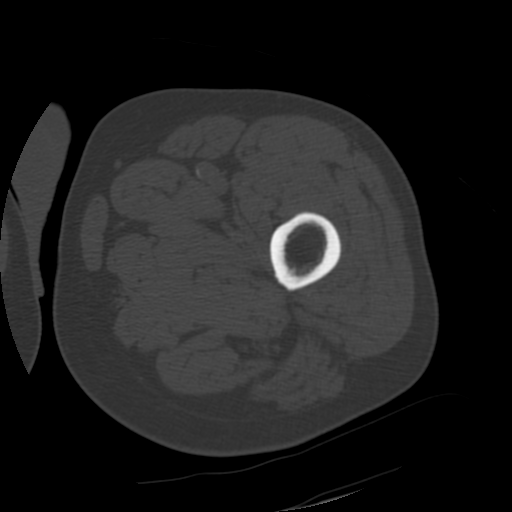
[im 29/197  soft-tissue]
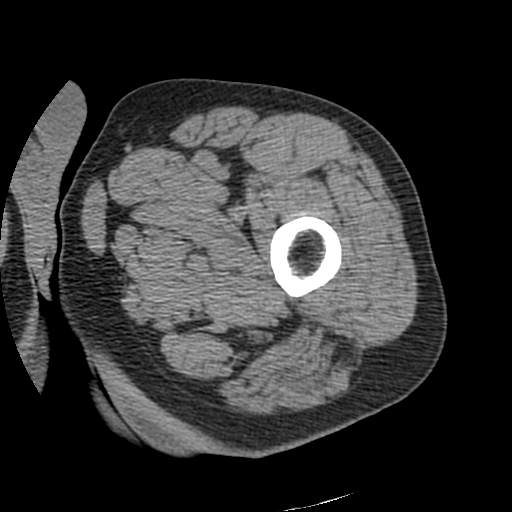
[im 43/197  soft-tissue]
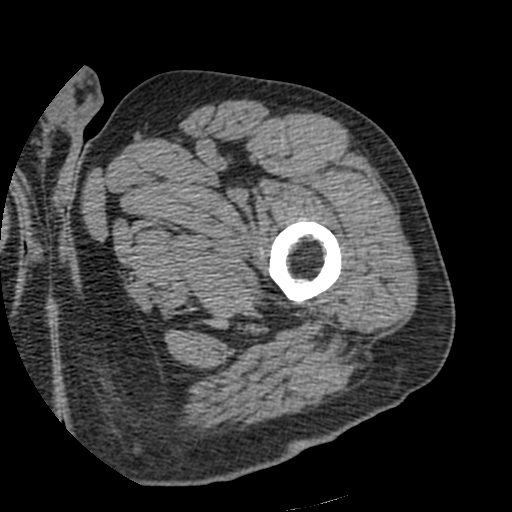
[im 57/197  soft-tissue]
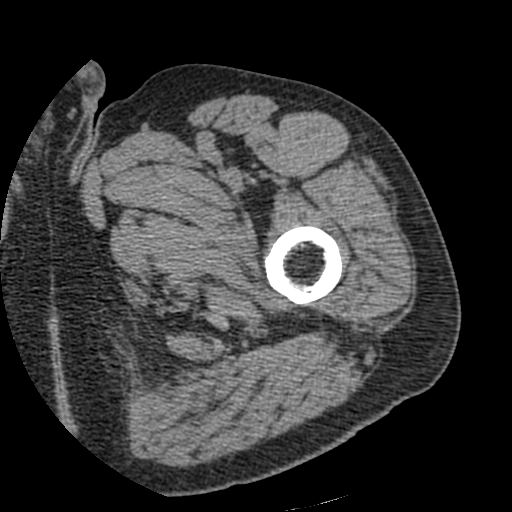
[im 71/197  soft-tissue]
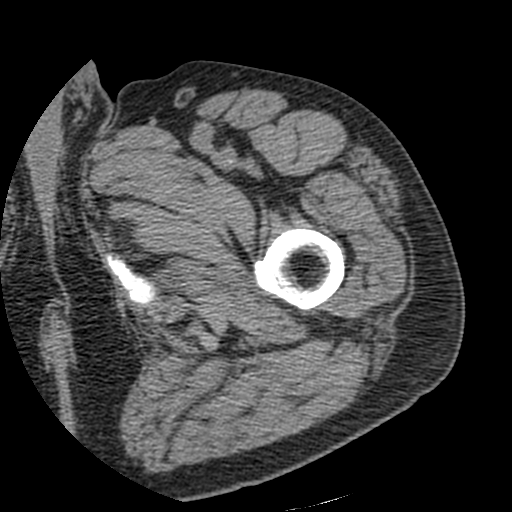
[im 85/197  soft-tissue]
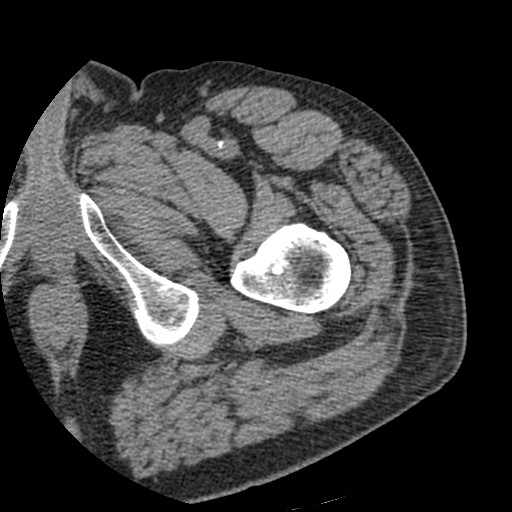
[im 99/197  soft-tissue]
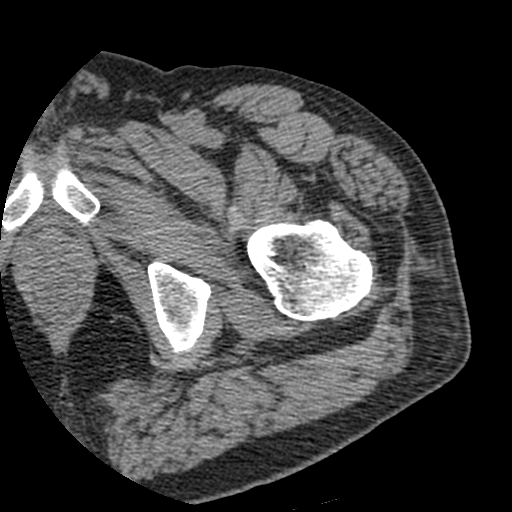
[im 113/197  soft-tissue]
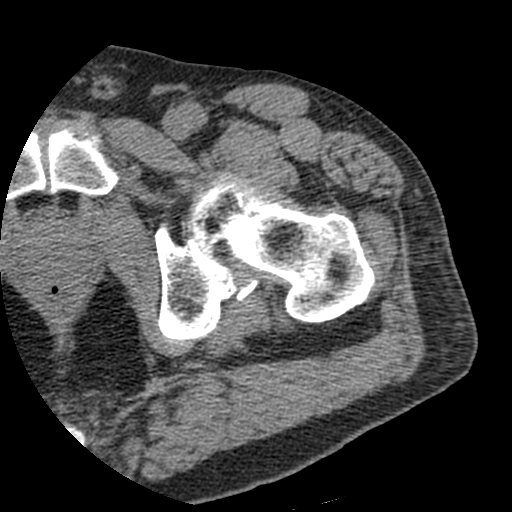
[im 127/197  soft-tissue]
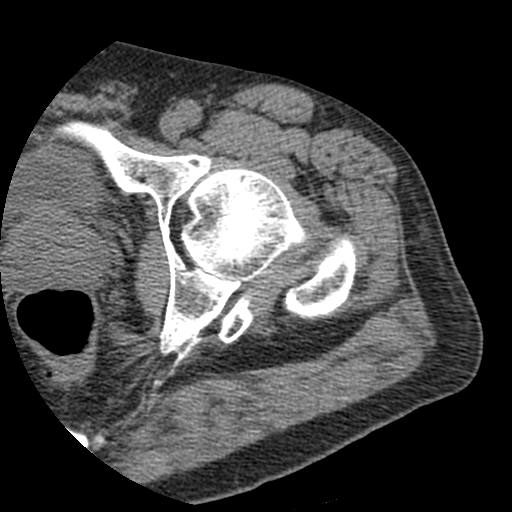
[im 127/197  bone]
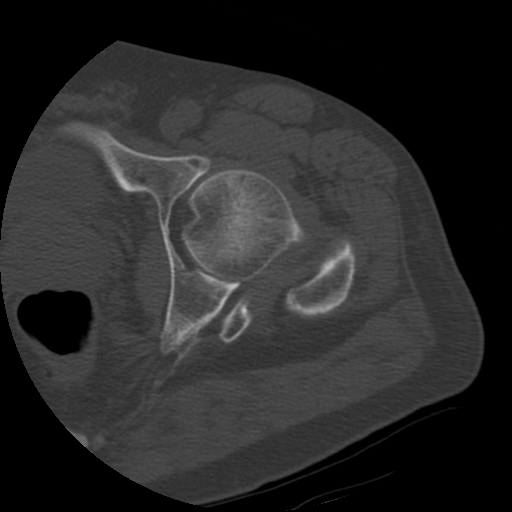
[im 141/197  soft-tissue]
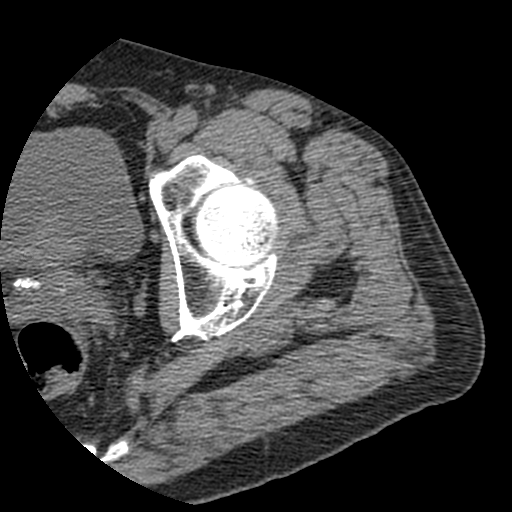
[im 141/197  lung]
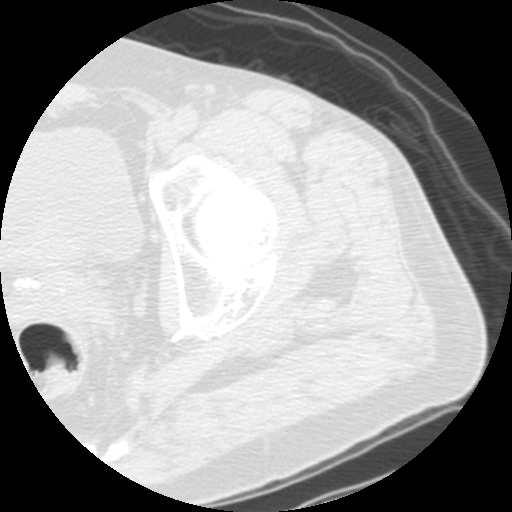
[im 155/197  soft-tissue]
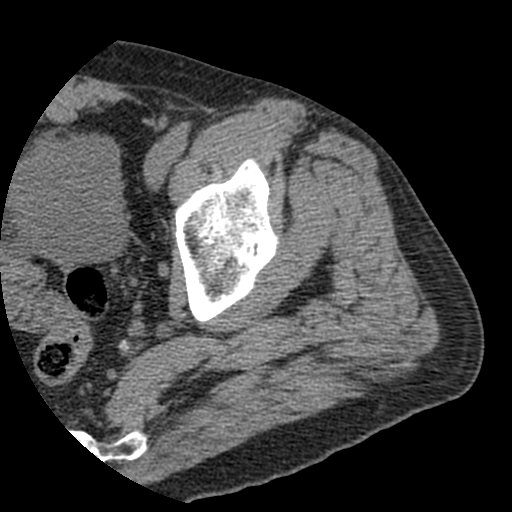
[im 155/197  lung]
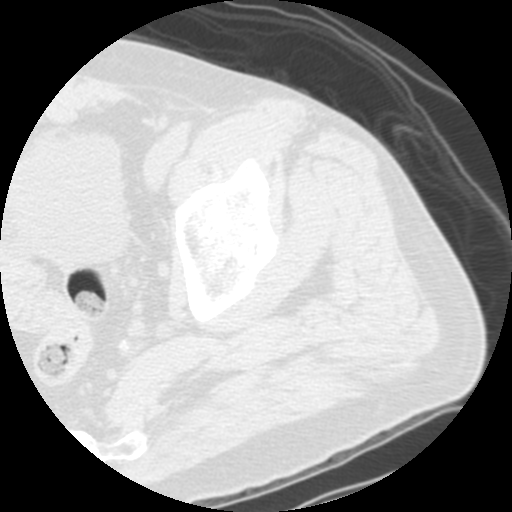
[im 169/197  soft-tissue]
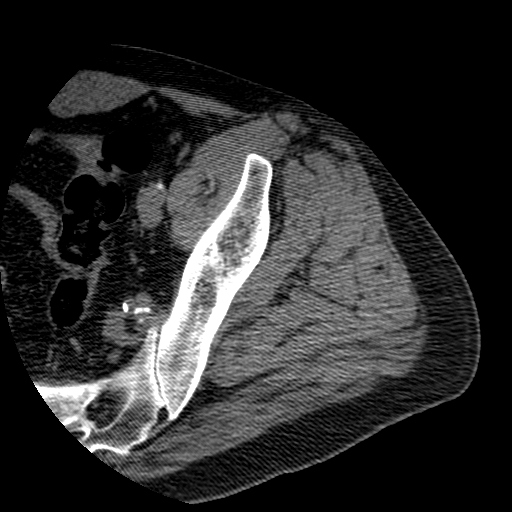
[im 169/197  lung]
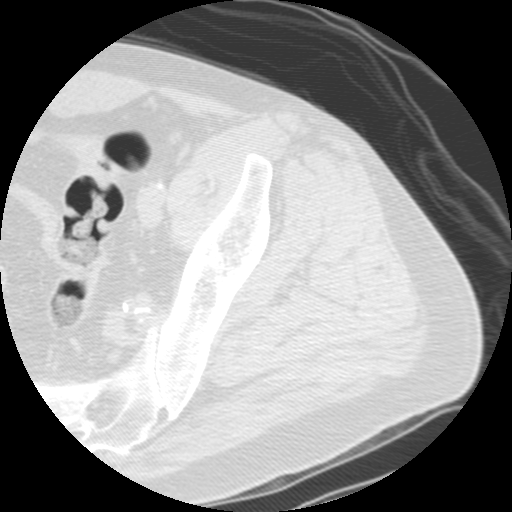
[im 183/197  soft-tissue]
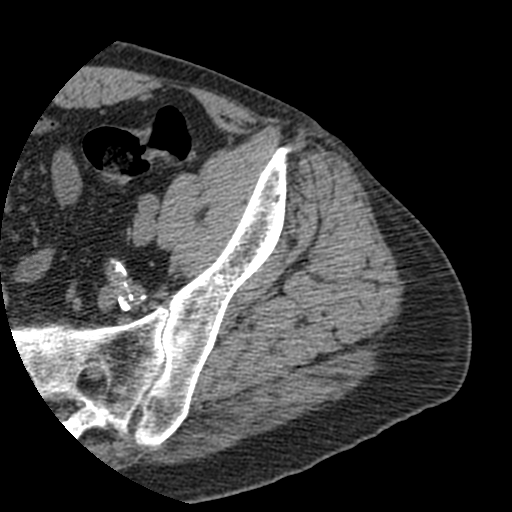
[im 183/197  lung]
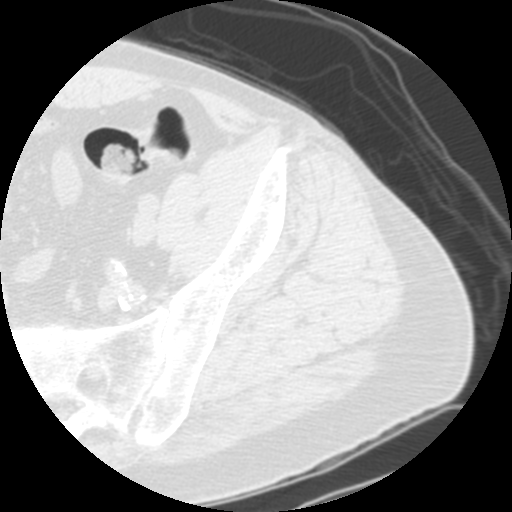

[13 of 32 positions shown; findings below may reference images not displayed]

FINDINGS: The patient has a remote fracture of the posterior wall
of the left acetabulum with a small amount of heterotopic
ossification present accounting for increased density seen on plain
film.  There is no acute fracture.  There is some osteoarthritic
change about the left hip with small subchondral cysts identified
in the left acetabular roof.  No lytic or blastic lesion to suggest
metastatic disease is identified.  The hip is located.  There is no
hip joint effusion.  Imaged musculature of the left pelvic girdle
is unremarkable.  Visualized intra pelvic contents appear normal.
IMPRESSION: 1.  Negative for acute abnormality or CT evidence of metastatic
disease.
2.  Remote healed fracture of the posterior wall of the left
acetabulum accounts for finding on plain films.
3.  Mild to moderate osteoarthritic change left hip.

## 2010-08-29 IMAGING — CR DG HIP (WITH OR WITHOUT PELVIS) 2-3V*L*
1 series · 1 of 1 positions shown · non-contrast
Comparison: None

CLINICAL DATA: Chest pain, left hip pain for a week

LEFT HIP - COMPLETE 2+ VIEW

[t pelvis a.p.]
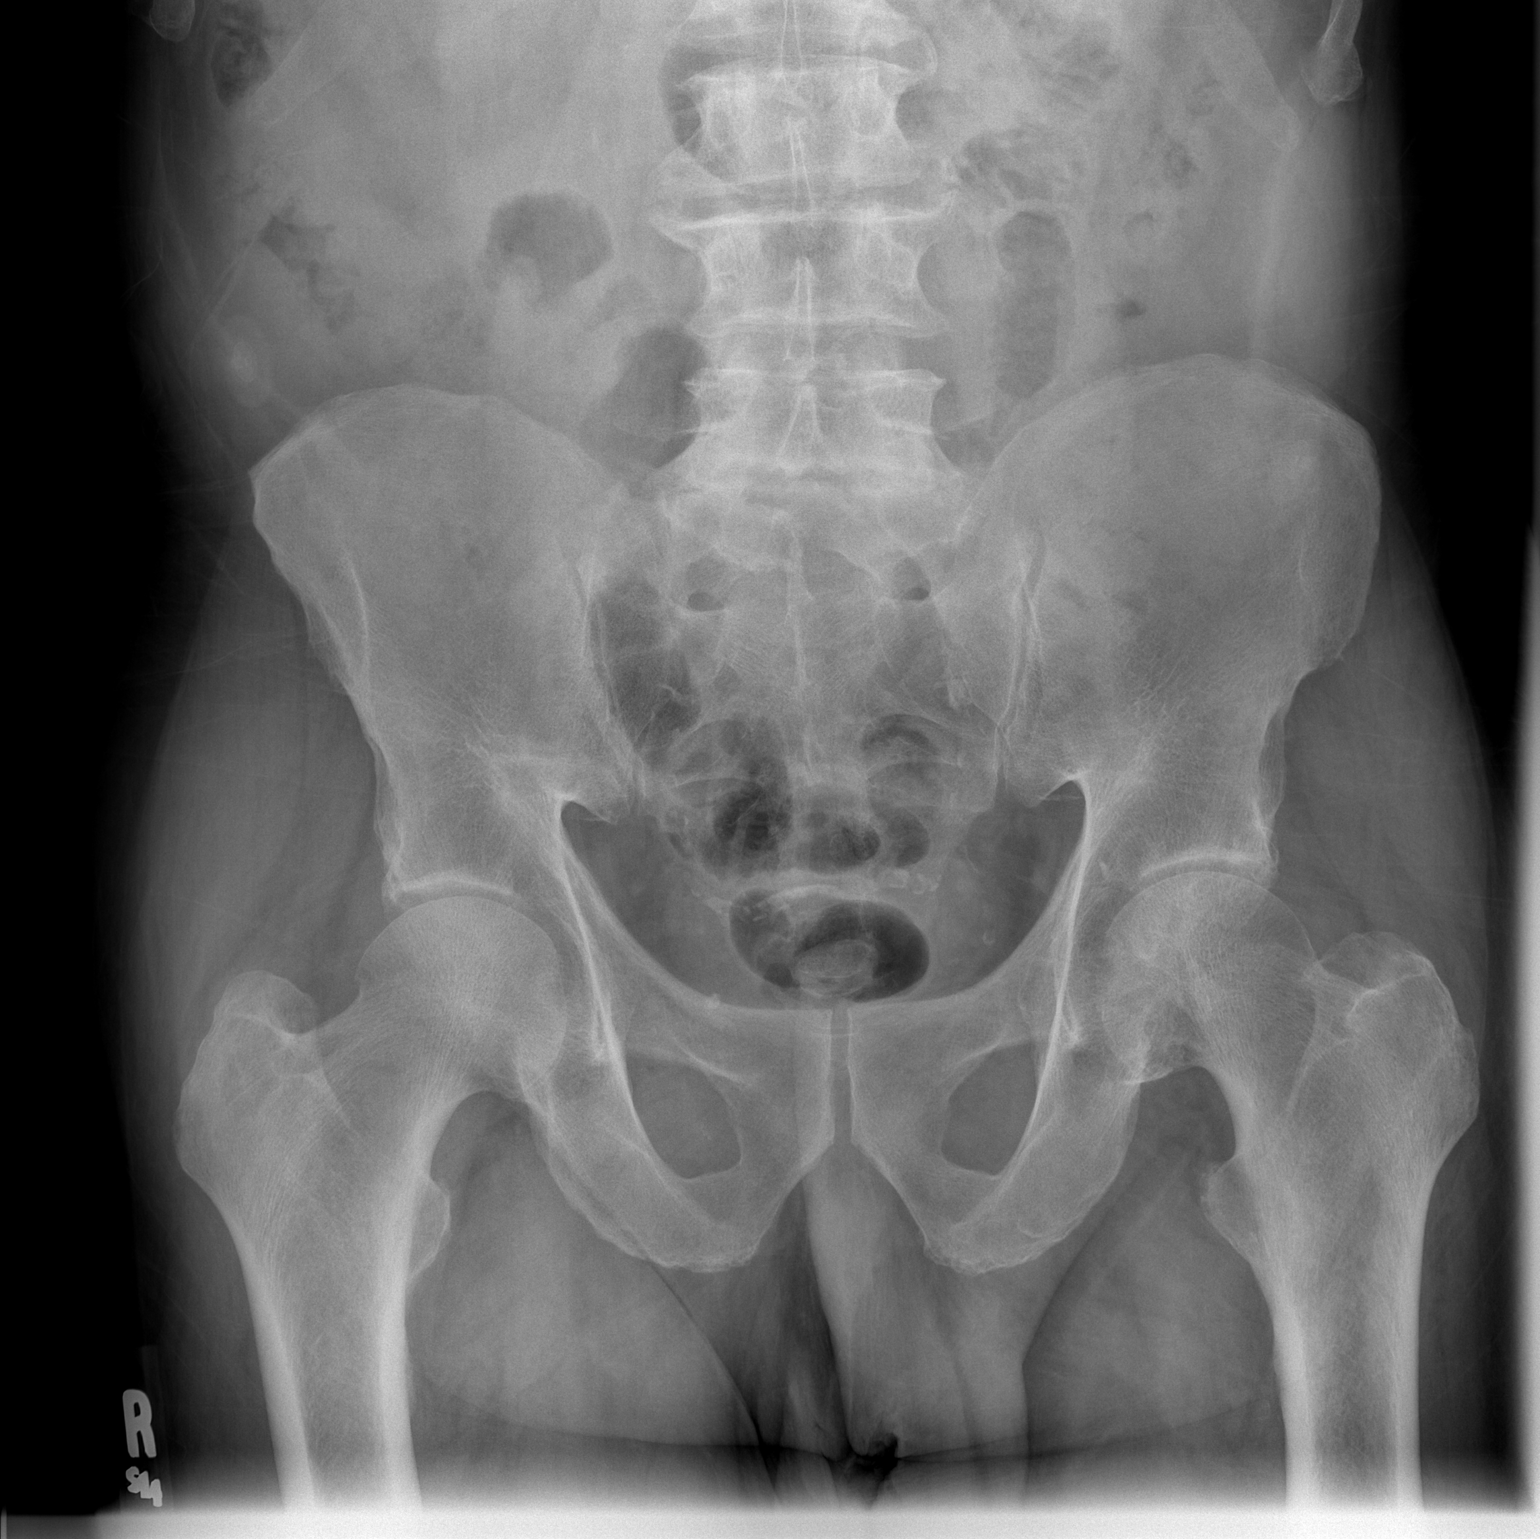

[1 of 1 positions shown; findings below may reference images not displayed]

FINDINGS: Three views of the left hip shows no acute fracture or
subluxation.  No significant degenerative changes are noted.
Subtle sclerosis noted in the left femoral head.  Correlation with
bone scan or MRI is recommended.
IMPRESSION: No left hip acute fracture or subluxation.  Subtle sclerosis left
femoral head.  Correlation with bone scan or MRI is recommended.

## 2010-08-29 MED ORDER — FUROSEMIDE 20 MG PO TABS: 20.0000 mg | ORAL_TABLET | Freq: Every day | ORAL | Status: DC

## 2010-08-31 LAB — POCT I-STAT, CHEM 8
Creatinine, Ser: 1.4 mg/dL (ref 0.4–1.5)
Hemoglobin: 15 g/dL (ref 13.0–17.0)
Sodium: 141 mEq/L (ref 135–145)
TCO2: 26 mmol/L (ref 0–100)

## 2010-09-03 ENCOUNTER — Other Ambulatory Visit: Payer: Self-pay | Admitting: *Deleted

## 2010-09-03 MED ORDER — HYDROCODONE-ACETAMINOPHEN 7.5-500 MG PO TABS: 1.0000 | ORAL_TABLET | Freq: Four times a day (QID) | ORAL | Status: DC | PRN

## 2010-09-03 NOTE — Telephone Encounter (Signed)
Rx called in 

## 2010-09-03 NOTE — Telephone Encounter (Signed)
Last refill 2/16 

## 2010-09-11 ENCOUNTER — Encounter: Payer: Self-pay | Admitting: Internal Medicine

## 2010-10-03 ENCOUNTER — Other Ambulatory Visit: Payer: Self-pay | Admitting: *Deleted

## 2010-10-03 MED ORDER — HYDROCODONE-ACETAMINOPHEN 7.5-500 MG PO TABS: 1.0000 | ORAL_TABLET | Freq: Four times a day (QID) | ORAL | Status: DC | PRN

## 2010-10-03 NOTE — Telephone Encounter (Signed)
Rx called in 

## 2010-10-03 NOTE — Telephone Encounter (Signed)
Last refill 3/19

## 2010-10-12 ENCOUNTER — Encounter: Payer: Self-pay | Admitting: Internal Medicine

## 2010-10-12 ENCOUNTER — Ambulatory Visit (INDEPENDENT_AMBULATORY_CARE_PROVIDER_SITE_OTHER): Payer: PRIVATE HEALTH INSURANCE | Admitting: Internal Medicine

## 2010-10-12 DIAGNOSIS — I10 Essential (primary) hypertension: Secondary | ICD-10-CM

## 2010-10-12 DIAGNOSIS — E785 Hyperlipidemia, unspecified: Secondary | ICD-10-CM

## 2010-10-12 DIAGNOSIS — Z0000 Encounter for general adult medical examination without abnormal findings: Secondary | ICD-10-CM

## 2010-10-12 DIAGNOSIS — N529 Male erectile dysfunction, unspecified: Secondary | ICD-10-CM | POA: Insufficient documentation

## 2010-10-12 LAB — COMPREHENSIVE METABOLIC PANEL
ALT: 17 U/L (ref 0–53)
Albumin: 4.1 g/dL (ref 3.5–5.2)
CO2: 23 mEq/L (ref 19–32)
Calcium: 9 mg/dL (ref 8.4–10.5)
Chloride: 106 mEq/L (ref 96–112)
Creat: 1.19 mg/dL (ref 0.40–1.50)
Sodium: 142 mEq/L (ref 135–145)
Total Protein: 7.6 g/dL (ref 6.0–8.3)

## 2010-10-12 LAB — LIPID PANEL
LDL Cholesterol: 99 mg/dL (ref 0–99)
Triglycerides: 42 mg/dL (ref <150)

## 2010-10-12 MED ORDER — SILDENAFIL CITRATE 100 MG PO TABS: 100.0000 mg | ORAL_TABLET | ORAL | Status: DC | PRN

## 2010-10-12 NOTE — Assessment & Plan Note (Signed)
Ongoing for the past several months, this is most likely age related secondary to his chronic medical conditions, no need to further workup. We'll give the patient Viagra, if this results in satisfactory affect we'll give the patient refills as needed.

## 2010-10-12 NOTE — Assessment & Plan Note (Signed)
Patient's LDL was elevated therefore his Crestor dose was escalated to 20 mg daily, will check fasting lipids and liver function test today,

## 2010-10-12 NOTE — Assessment & Plan Note (Signed)
Well controlled on current treatment, No new changes made today, Will continue to monitor.   

## 2010-10-12 NOTE — Progress Notes (Signed)
  Subjective:    Patient ID: [name], male    DOB: 06-25-1945, 65 y.o.   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 Patient today reluctantly discussed with me his concerns about mild erectile dysfunction that he has been experiencing, he said this has been going on for the past several months and would like to know there is anything we can do to help him with this. Denies fever, chills, weight loss, nausea, vomiting, Or diarrhea. Patient states that he is compliant with all of his medications.    Review of Systems  All other systems reviewed and are negative.       Objective:   Physical Exam  Nursing note and vitals reviewed. Constitutional: He is oriented to person, place, and time. He appears well-developed and well-nourished.  HENT:  Head: Normocephalic and atraumatic.  Eyes: Pupils are equal, round, and reactive to light.  Neck: Normal range of motion. No JVD present. No thyromegaly present.  Cardiovascular: Normal rate, regular rhythm and normal heart sounds.   Pulmonary/Chest: Effort normal and breath sounds normal. He has no wheezes. He has no rales.  Abdominal: Soft. Bowel sounds are normal. There is no tenderness. There is no rebound.  Musculoskeletal: Normal range of motion. He exhibits no edema.  Neurological: He is alert and oriented to person, place, and time.  Skin: Skin is warm and dry.          Assessment & Plan:

## 2010-10-23 ENCOUNTER — Other Ambulatory Visit: Payer: Self-pay | Admitting: *Deleted

## 2010-10-24 MED ORDER — AMLODIPINE BESYLATE 10 MG PO TABS: 10.0000 mg | ORAL_TABLET | Freq: Every day | ORAL | Status: DC

## 2010-10-30 NOTE — Assessment & Plan Note (Signed)
OFFICE VISIT   KEONTE, DAUBENSPECK  DOB:  02-18-1946                                        January 29, 2007  CHART #:  [id number]   REASON FOR OFFICE VISIT:  Followup after lung cancer surgery.   HISTORY OF PRESENT ILLNESS:  Mr. Kantz is a 65 year old gentleman who  had a right upper lobectomy for a stage IB lung cancer last November.  This was an enlarged cavitating lesion.  All of his nodes were negative.  He had a long and complicated postoperative course, but when I saw him  in March he was doing well at that time.  Since then he has continued to  improve.  He does not have any residual pain, but does have some  residual numbness along the right anterior costal margin.  He has not  had any problems with his breathing.  He denies any cough, hemoptysis,  any new bone or joint pain, or unusual headaches or visual changes.   CURRENT MEDICATIONS:  The patient's current medications were reviewed  and copy is on the chart.  He is currently taking:  1. Omeprazole.  2. Vytorin.  3. Aspirin.  4. Colchicine.  5. Indomethacin.  6. Lopressor.  7. Lisinopril.  8. Hydrochlorothiazide.  9. Nicoderm patch.  10.Niacin.  11.Vicodin p.r.n.   PHYSICAL EXAMINATION:  General:  Mr. Huser is a well-appearing 61-year-  old African American male. He has gained weight since I last saw him.  His general appearance is well-developed, well-nourished.  Vital signs:  His blood pressure is 134/85.  Pulse 83.  Respirations are 18.  Oxygen  saturation is 98% on room air.  Lungs:  His lungs are clear with  essentially equal breath sounds.  Cardiac exam:  Regular rate and  rhythm, normal S1 and S2.  No murmurs, rubs, or gallops.  His wounds are  well-healed.  There is no cervical, supraclavicular, axillary or  epitrochlear adenopathy.   Chest CT is reviewed and shows postoperative changes.  No evidence of  recurrent disease.  There is a right hilar node that is about 1 cm x 1.3  cm.  This does not appear pathologic, but will bear further followup.   IMPRESSION:  Mr. Storlie is doing very well at this point in time.  He is  about 9 months out from his surgery, with no evidence of recurrent  disease.  I will plan to see him back in 3 months.  We will repeat a CT  scan at that time for his 1 year followup visit.  He has another  appointment with Dr. Arbutus Ped scheduled in January.   Salvatore Decent Dorris Fetch, M.D.  Electronically Signed   SCH/MEDQ  D:  01/29/2007  T:  01/30/2007  Job:  [id number]   cc:   Lajuana Matte, MD

## 2010-10-30 NOTE — Assessment & Plan Note (Signed)
OFFICE VISIT   NAZIM, KADLEC  DOB:  09/06/1945                                        June 02, 2007  CHART #:  [id number]   REASON FOR VISIT:  One-year followup following right lobectomy for lung  cancer.   HISTORY OF PRESENT ILLNESS:  Mr. Gosse is a 65 year old gentleman who  had a stage 1B lung cancer.  He had a right lobectomy for this mass at  that time.  He had a long and complicated postoperative course, but  eventually was discharged after about a month and has done well since  that time.  He was last seen in the office on January 29, 2007.  At that  time, he had a CT scan which showed no evidence of recurrent disease,  but there was a slightly prominent right hilar node.  He now returns for  followup.  He states that since his last visit, he has been doing well.  He has not had any new bone or joint pain.  No headaches or visual  changes.  He has had a cough with an upper respiratory infection a few  weeks ago, but that has resolved.  He has not had any hemoptysis.  His  weight has been stable.  He states that he has smoked cigarettes  occasionally since his last visit.   The patient's medical history form is on the chart and was reviewed.   MEDICATIONS:  There has been no medication changes since his last office  visit.  He remains on omeprazole, Vytorin, aspirin, Cotranzine,  Indomethacin, blood pressure lisinopril, hydrochlorothiazide, niacin,  and Vicodin.  He has been using Nicoderm patch as well.   PHYSICAL EXAMINATION:  On physical examination, Mr. Rowe is a well-  appearing 65 year old African American male in no acute distress.  His  blood pressure is 133/88, pulse 74, respirations 18, oxygen saturation  98% on room air.  His thoracotomy and chest tube sites are well-healed.  NEUROLOGICAL:  He is alert and oriented x3.  He is appropriate, grossly  intact.  NECK:  Supple without adenopathy, thyromegaly, or bruits.  There is no  cervical, supraclavicular, axillary, or epitrochlear  adenopathy.  LUNGS:  Clear with equal breath sounds bilaterally.  There  is no wheezing.   CT scan from today is reviewed.  It has not yet been read.  There is no  new lung nodules.  There is some calcifications ascending aorta which is  unchanged.  The area of prominent right hilar node appears unchanged or  possibly slightly improved.  Will await the official report.   IMPRESSION:  Mr. Deziel is a 65 year old gentleman.  He is now a year  out from right upper lobectomy for stage 1B cancer.  He has no evidence  of recurrent disease at this time.  He has an appointment for another CT  scan for January, has an appointment at Dr. Asa Lente office.  We will  clarify that and keep him from having double radiologic studies.  He  does need another CT in about 4-6 months, but does not need one in  January unless specific issues arise.   Mr. Handlin is doing well at this point in time.  No medication changes  or other interventions were undertaken.   Salvatore Decent Dorris Fetch, M.D.  Electronically Signed  SCH/MEDQ  D:  06/02/2007  T:  06/02/2007  Job:  [id number]   cc:   Lajuana Matte, MD

## 2010-10-30 NOTE — Assessment & Plan Note (Signed)
OFFICE VISIT   CHRISTHOPER, BUSBEE  DOB:  27-Apr-1946                                        August 11, 2008  CHART #:  [id number]   REASON FOR VISIT:  Followup for lung cancer.   HISTORY:  The patient is a 65 year old gentleman who had a right upper  lobectomy for a stage IB non-small cell carcinoma in December 2007.  He  was last seen in the office in September 2009.  Since that time, he says  he has been doing well.  He has still occasionally been smoking.  His  only complaint is pain in his left hip, but she says that it been  entirely may need to have replaced due to arthritis.  He has not had any  cough, hemoptysis, or any problems with his breathing, any chest pain or  shortness of breath.  He has not had any headaches or visual changes.  His weight has been stable.   PAST MEDICAL HISTORY:  Significant for alcoholism, coronary artery  disease, MI, gastroesophageal reflux, tobacco abuse, COPD, congestive  heart failure, gout, and arthritis.   CURRENT MEDICATIONS:  Omeprazole, aspirin, colchicine, indomethacin  p.r.n., Lopressor, lisinopril, Norvasc, Crestor.   ALLERGIES:  He has no known drug allergies.   FAMILY HISTORY:  Unchanged.   SOCIAL HISTORY:  Unchanged.   REVIEW OF SYSTEMS:  See HPI, otherwise negative.   PHYSICAL EXAMINATION:  GENERAL:  The patient is a 65 year old African  American male, in no acute distress.  He is well developed and well  nourished.  VITAL SIGNS:  His blood pressure is 127/81, pulse 66, respirations 18,  and his ox saturation is 97% on room air.  NEUROLOGICAL:  He has no focal deficits.  NECK:  Supple without thyromegaly or adenopathy.  There is no  supraclavicular, axillary, or epitrochlear adenopathy.  LUNGS:  Clear bilaterally.  Incisions are well healed.  CARDIAC:  Regular rate and rhythm.  Normal S1 and S2.   CT of the chest is reviewed and compared to previous films.  There is no  evidence of recurrent  metastatic disease.  There is an indeterminate  left renal lesion, which has been stable on multiple scans.  He does  have some coronary atherosclerosis, which is known.  He does have a  vague area in hepatic dome, which is noted by the radiologist to be less  conspicuous on his present scan.   IMPRESSION:  The patient is now a little over 2 years out from a right  upper lobectomy for stage IB non-small cell carcinoma.  He has no  evidence of recurrent disease.  He is going to see Dr. Arbutus Ped in  August, which will be in about 6 months.  I will plan to see him back in  a year.  We will do a CT of the chest at that time.  In the meantime, he  was counseled again to stop smoking completely.   Salvatore Decent Dorris Fetch, M.D.  Electronically Signed   SCH/MEDQ  D:  08/11/2008  T:  08/11/2008  Job:  [id number]   cc:   Lajuana Matte, MD

## 2010-10-30 NOTE — Assessment & Plan Note (Signed)
OFFICE VISIT   GEORGI, NAVARRETE  DOB:  [birth year]                                        September 12, 2009  CHART #:  [id number]   HISTORY:  The patient is a 65 year old gentleman who had a right upper  lobectomy for stage IB lung cancer, it was quite a large cancer, it was  8 cm in diameter, but of course all the nodes were negative.  He has  been followed since that time.  He was last seen in the office in  February 2010, at which time he was doing well.  He has been seen in the  interim by Dr. Arbutus Ped.  He states that he has really been feeling well.  He is not having any problems with chest pain or shortness of breath.  His exercise tolerance is good.  He has not had any illnesses over the  winter.  He says that he still does smoke an occasional cigarette, but  not on a daily basis.  He otherwise has no complaints.   CURRENT MEDICATIONS:  1. Omeprazole 20 mg b.i.d.  2. Aspirin 325 mg daily.  3. Colchicine 0.6 mg b.i.d.  4. Lopressor 50 mg daily.  5. Norvasc 10 mg daily.  6. Crestor 10 mg daily.  He is no longer on a nicotine patch.  7. Furosemide 20 mg daily.  8. He uses indomethacin and hydrocodone p.r.n.   ALLERGIES:  He has no known drug allergies.   There has been no change in family or social history.   REVIEW OF SYSTEMS:  Denies chest pain, shortness of breath, cough,  hemoptysis, or wheezing.  No new bone or joint pain.  No new headaches  or visual changes.  All other systems are negative.   PHYSICAL EXAMINATION:  General:  The patient is a 65 year old gentleman,  in no acute distress.  Vital Signs:  Blood pressure is 120/76, pulse 70,  respirations are 18, and his oxygen saturation is 97% on room air.  Neurological:  He is alert and oriented x3 with no focal deficits.  HEENT:  Unremarkable.  He is wearing glasses.  Neck:  There is no  cervical or supraclavicular adenopathy.  There is no axillary or  epitrochlear adenopathy.  Lungs:  Clear  with essentially equal breath  sounds bilaterally.   LABORATORY DATA:  CT scan of the chest shows no evidence of recurrent  disease.  There has been a change in a left renal lesion, this has been  present on his CT, this has been felt to be a sort of a complex cyst.  It does appear to be a little more dense, which the radiologist feels as  likely just hemorrhage into this complex cyst, but they did recommend  adding a contrast CT of the abdomen with his next CT of the chest, which  is scheduled through Dr. Asa Lente office in August.   IMPRESSION:  The patient is doing very well.  He is now about 3-1/2  years out with no evidence of recurrent disease.  From a pulmonary  standpoint, he is doing well.  Once again, counseled complete smoking  cessation.  He understands the importance and has done a good job  overall, but does need to cut out the occasional cigarette that he is  still smoking.  He otherwise looks great  and feels very well.  His scan  is stable with the exception of this issue of the left renal cyst with  hemorrhage.  Radiology recommended a contrast CT of that area when he  has his next chest CT, which is scheduled for August with Dr. Arbutus Ped.  We will see if we can add that on and then I will plan to see him back  with a CT of the chest in December, which will be his 4-year followup  visit.   Salvatore Decent Dorris Fetch, M.D.  Electronically Signed   SCH/MEDQ  D:  09/12/2009  T:  09/13/2009  Job:  [id number]   cc:   Lajuana Matte, MD  Brooks Sailors, MD

## 2010-10-30 NOTE — Assessment & Plan Note (Signed)
OFFICE VISIT   SOLON, ALBAN  DOB:  Sep 30, 1945                                        October 06, 2007  CHART #:  [id number]   The patient is a 65 year old gentleman who had a stage IB lung cancer  resected with a right upper lobectomy in December of 2007.  He had a  complicated postoperative course but since being discharged from the  hospital has made steady progress.  He has been doing well and has not  had any further difficulties.  He was seen in December of 2008 for his 1-  year followup visit with no evidence of recurrent disease.  He now  returns for a scheduled followup visit.  He says he has been doing well.  He has not noted any cough, hemoptysis, changes in his breathing, chest  pains, anginal or otherwise.  He has not had any unusual headaches or  visual symptoms.  No new bone or joint pain.  No changes in bowel or  bladder habits.  He denies blood in his urine or stool.   Patient's medical history form is reviewed and is on the chart.  Medication list has been updated.   PHYSICAL EXAMINATION:  The patient is a 65 year old African American  male in no acute distress.  His blood pressure is 144/92.  Pulse 78.  Respirations are 18.  His oxygen saturation is 98% on room air.  Lungs:  Clear with equal breath sounds bilaterally.  Cardiac:  Exam has regular  rate and rhythm.  Normal S1 and S2.  There is no cervical,  supraclavicular, axillary, or epitrochlear adenopathy.   LABORATORY DATA:  CT scan shows some coronary atherosclerosis.  There is  a subtle area of hyperenhancement in the hepatic dome.  This apparently  has increased since the July 2008 exam.  It could be a hemangioma but  cannot rule out a metastatic lesion although certainly not classic.  He  also has a left renal lesion which measures greater than fluid density  and could be a complex cyst.  He does have some fatty infiltration in  his liver.   IMPRESSION:  Discussed the CT  findings with the patient and offered him  the option of a repeat CT scan in 4 months.  He already has one  scheduled for August with Dr. Arbutus Ped versus  doing an MRI at the present time.  He wishes to wait and have his CT  scan done and I will plan to see him back at that time as well in  addition to him seeing DR. Mohamed.   Salvatore Decent Dorris Fetch, M.D.  Electronically Signed   SCH/MEDQ  D:  10/06/2007  T:  10/06/2007  Job:  [id number]   cc:   Lajuana Matte, MD

## 2010-10-30 NOTE — Assessment & Plan Note (Signed)
OFFICE VISIT   [name], [name]  DOB:  1946/02/01                                        February 23, 2008  CHART #:  [id number]   The patient is a 65 year old gentleman, who had a stage IB lung cancer  resected with a right upper lobectomy in December 2007.  He had a  complicated postoperative course, but done well since then.  He was last  seen in the office on October 06, 2007, at which time there was noted some  subtle enhancement in the hepatic dome, this was felt to possibly be a  hemangioma.  An MRI was recommended, but the patient did not wish to  have that done at that time.  He now returns, in the interim had a 4-  month followup CT scan which was done on January 26, 2008.  He saw Dr.  Arbutus Ped at that time.  There was no evidence of recurrent disease and  the liver lesion was not apparent on the study.   The patient states that he has been feeling well.  He has not had any  difficulty with his breathing through the summer.  When asked about  smoking, he says that he is still doing and occasionally.   PHYSICAL EXAMINATION:  GENERAL:  The patient is a 65 year old African  American male in no acute distress.  VITAL SIGNS:  Blood pressure is 131/87, pulse 80, respirations 18.  His  oxygen saturation is 98% on room air.  LUNGS:  Clear with essentially equal breath sounds bilaterally.  CARDIAC:  Regular rate and rhythm.  Normal S1 and S2.  No rubs or  murmurs.  NECK:  There is no cervical, supraclavicular, axillary, or epitrochlear  adenopathy.   CT scan is reviewed and shows no evidence of recurrent disease.   IMPRESSION:  The patient is a 65 year old gentleman.  He is now 20  months out from a right upper lobectomy for stage I non-small-cell  carcinoma.  He is doing well at this point in time.  There is no  evidence of recurrent disease.  He is scheduled for a CT scan in 6  months with Dr. Arbutus Ped, that will be a big milestone because it will be  at 2 years postoperatively.  I will plan  to see him back at that time and we will just look at the same CT scan.  He knows to call in the meantime, if he needs any further assistance.   Salvatore Decent Dorris Fetch, M.D.  Electronically Signed   SCH/MEDQ  D:  02/23/2008  T:  02/24/2008  Job:  [id number]   cc:   Lajuana Matte, MD

## 2010-10-30 NOTE — Assessment & Plan Note (Signed)
OFFICE VISIT   [name], [name]  DOB:  [birth year]                                        June 19, 2010  CHART #:  [id number]   HISTORY:  The patient is a 65 year old gentleman who had a right upper  lobectomy for stage IB lung cancer back in 2007.  He had a complicated  postoperative course with prolonged respiratory failure, required  tracheostomy, but ultimately did well, has been followed since that  time.  He was last seen in the office in March 2011, at which time he  was doing well with no complaints.  He returns today since he is still  doing well, has not had any new major medical problems over the past  year.  He has still been smoking about 6 cigarettes a day.  He is not  having chest pain.  He says he walks on a regular basis, sometimes he  feels short of breath with his walks but he will rest for a few minutes  and then can continue on.  It is certainly not interfering with his  activities of daily living.  He denies any cough or hemoptysis.  His  weight has been stable.  He gained a few pounds over the holidays.   CURRENT MEDICATIONS:  1. Omeprazole 20 mg b.i.d.  2. Aspirin 325 mg daily.  3. Colchicine 0.6 mg b.i.d.  4. Indomethacin 25 mg t.i.d. p.r.n.  5. Lopressor 50 mg daily.  6. Norvasc 10 mg daily.  7. Crestor 10 mg daily.  8. Furosemide 20 mg daily.   ALLERGIES:  He has no known drug allergies.   REVIEW OF SYSTEMS:  See HPI, otherwise all negative.   PHYSICAL EXAMINATION:  The patient is a well-appearing 65 year old male  in no acute distress.  Neurologically, he is alert and oriented x3 with  no focal deficits.  His HEENT exam is unremarkable.  Neck is without  thyromegaly or adenopathy.  His cardiac exam is regular rate and rhythm.  There is a 2/6 systolic murmur.  Lungs are clear with equal breath  sounds bilaterally.  There is no cervical, supraclavicular, axillary, or  epitrochlear adenopathy.   LABORATORY DATA:  CT of  the chest is reviewed.  It is unchanged to my  exam, but it has not been officially read.  There is no evidence of new  parenchymal nodules, hilar or mediastinal adenopathy, liver or adrenal  metastases.   IMPRESSION:  The patient is a 65 year old gentleman who is now well over  4 years out from resection for a stage IB lung cancer with no evidence  of recurrent disease.  He has an appointment to see Dr. Arbutus Ped in  August.  I will plan on seeing him back around next November for a 5-  year followup visit and then did once again counsel him to stop smoking.  It is very important as he is at risk for a subsequent lung cancer to  develop.   Salvatore Decent Dorris Fetch, M.D.  Electronically Signed   SCH/MEDQ  D:  06/19/2010  T:  06/19/2010  Job:  [id number]   cc:   Brooks Sailors, MD  Lajuana Matte, MD

## 2010-11-01 ENCOUNTER — Other Ambulatory Visit: Payer: Self-pay | Admitting: *Deleted

## 2010-11-01 MED ORDER — HYDROCODONE-ACETAMINOPHEN 7.5-500 MG PO TABS: 1.0000 | ORAL_TABLET | Freq: Four times a day (QID) | ORAL | Status: DC | PRN

## 2010-11-01 NOTE — Telephone Encounter (Signed)
Rx called in 

## 2010-11-01 NOTE — Telephone Encounter (Signed)
Last refill 4/18

## 2010-11-02 NOTE — Consult Note (Signed)
[name], [name]               ACCOUNT NO.:  [id number]   MEDICAL RECORD NO.:  [id number]          PATIENT TYPE:  INP   LOCATION:  2314                         FACILITY:  [facility]   PHYSICIAN:  Wilber Bihari. Caryn Section, M.D.   DATE OF BIRTH:  [birth year]   DATE OF CONSULTATION:  05/07/2006  DATE OF DISCHARGE:                                   CONSULTATION   HISTORY OF PRESENT ILLNESS:  A 65 year old male admitted with chest pain and  found to have a 5 cm lung lesion in his right mid lung on April 21, 2006.  Patient had a CT with contrast at this time.  PET scan on April 24, 2006  found a necrotic right lower lobe mass, hypermetabolic, consistent with  primary lung cancer.  On April 28, 2006 a right upper lobectomy was done  and patient was found to have squamous cell carcinoma.  Post op, patient was  hypotensive with blood pressures 80s over 40s-50s and a urine output of  about 5 mL per  hour.  Patient's creatinine went from a baseline of 1.0 on  admission to 3.0.  The next day, with blood pressures back up again on  Dopamine, creatinine went down to 2.2 and his urine output improved.  A CT  with no contrast was done on the same day and showed a right hemothorax and  patient was taken back to the OR and had an evacuation on May 02, 2006.  On May 03, 2006 patient's creatinine was down to 1.0, his blood  pressures were 100/60's with good urine output.  After his second surgery,  patient stayed on the ventilator and was diagnosed with ARDS.  On May 05, 2006 the patient again had decreased urine output of about 20 cc per  hour with normal blood pressures.  RN reported a discharger on the Foley, it  was swabbed and the swab was negative on culture.  Patient's creatinine was  elevated to 3.0 on May 05, 2006 and has increased to 4.3 today,  May 07, 2006.   PAST MEDICAL HISTORY/PROBLEMS:  1. Acute renal failure, baseline creatinine 1.0 on admission.  2. Vent dependent  respiratory failure/ARDS.  3. Squamous cell carcinoma status post lobectomy November 12th, hemothorax      evacuation November 16th.  4. Alcoholism/DTs.  5. Coronary artery disease status post MI 04, stent x2, EF 53%.  6. GERD.  7. Tobacco abuse 40+ pack year history.  8. Gout.  9. Hyperlipidemia.  10.MRSA on BAL cultures from May 03, 2006.  11.CHF.  12.History of inguinal hernia repair.   SOCIAL HISTORY:  Patient has smoked for 40+ pack years.  Drinks beer and  gin.  Divorced and lives with his brother.   FAMILY HISTORY:  Mom died in 19's, 1 sister has coronary artery disease.   ALLERGIES:  No known drug allergies.   HOME MEDICATIONS:  Atacand, atenolol, Norvasc, Plavix, Protonix, Vytorin,  and aspirin.  Of note, here in the hospital, medications that affect the  kidneys, patient is currently on Vancomycin and he was on Lisinopril until  May 05, 2006.   PHYSICAL EXAMINATION:  T-max was 100.3 last night, T-current 98.0, pulse 95-  115, respirations 28, blood pressure 100/50 which has come up to 165/75 on  Dopamine.  Patient is 89% on AC DC, FiO2 of 40%, PEEP of 5.  I's and O's  3871/1060 for a positive 2.8 L yesterday.  Patient was positive 2.9 L the  day before and has been positive since May 02, 2006.  His urine output  was 44 mL per hour average yesterday, today it is 16.25 mL per hour in the  last 8 hours average.   On exam, patient's eyes do not track.  He will minimally respond to pain.  He is intubated, tachycardic, no murmurs, coarse breath sounds bilaterally  anterior.  ABDOMEN:  Is distended with slightly hypo bowel sounds.  EXTREMITIES:  Pedes edematous, slight lower extremity edema, SCV's on  bilaterally with 2+ pedal pulses.  GU EXAM:  Patient has hypospadias, a Foley is in place.   LABORATORY DATA:  Respiratory culture May 03, 2006 grew MRSA.  Blood  culture November 19, May 03, 2006 showed no growth.  A urine culture on  May 03, 2006 showed no growth, C. diff is pending.  CT and PET are as  above.  Renal ultrasound May 06, 2006 showed a 7 mm simple cyst on  right, right kidney is 12.7 cm, left 12.4 cm.  ABG's, pH 7.340, PC O2 40.9,  PO2 85, bicarb 22.1 96%.  Phosphorus level on May 05, 2006 was 2.4,  sodium 142, potassium 5.0, chloride 115, bicarb 22, BUN 35, creatinine 4.3,  glucose 141, AST 91, ALT 76, protein 4.6, albumin 1.4, calcium 7.9 which  corrects to 10 with the albumin.  White blood cells have been rising and  today they are 17.4, H and H 8.2/24, platelets 258, BNP 589.   ASSESSMENT/PLAN:  This is a 65 year old male with:  1. Renal.  Acute renal failure likely secondary to hypoperfusion, acute      tubular necrosis.  Creatinine is rising and his urine output is      decreasing.  Will give Lasix 160 mg IV q.6 x2 doses.  And then watch      his urine output closely.  Patient may need hemodialysis or continuous      venovenous hemodialysis soon.  We will decrease his IV fluids to [facility].      Hyperkalemia.  Lasix should help.  Will follow and may need continuous      venovenous hemodialysis versus hemodialysis.  Will recheck at 2 p.m. to      make sure it is not increasing.  Calcium and phosphorus are currently      normal, will follow.  2. Cardiac.  Patient is on Dopamine, tachycardic on metoprolol; ejection      fraction 53%.  He is on Plavix status post stent.  3. Respiratory.  Vent-dependent respiratory failure/ARDS protocol, on      Atrovent and Albuterol schedule.  BAL with Methicillin resistant      Staphylococcus aureus.  Patient is on contact precautions and      Vancomycin.  His white blood cells are increasing.  Status post      lobectomy April 30, 2006.  4. Gastrointestinal.  Patient is on Flagyl IV for his questioned      Clostridium difficile.  Zantac.  He was switched from Oxepa to Nepro      today for severe protein malnutrition. 5. Hematological.  Patient's hemoglobin stable  between 8 and 9.  6. Infectious Disease.  Patient is on Vancomycin, Maxipime, and Flagyl.      Rolm Gala, M.D.    ______________________________  Wilber Bihari. Caryn Section, M.D.    Bennetta Laos  D:  05/07/2006  T:  05/07/2006  Job:  [id number]

## 2010-11-02 NOTE — Cardiovascular Report (Signed)
NAME:  [name], [name]                         ACCOUNT NO.:  [id number]   MEDICAL RECORD NO.:  [id number]                   PATIENT TYPE:  INP   LOCATION:  6532                                 FACILITY:  [facility]   PHYSICIAN:  Charlies Constable, M.D.                  DATE OF BIRTH:  September 23, 1945   DATE OF PROCEDURE:  05/02/2003  DATE OF DISCHARGE:                              CARDIAC CATHETERIZATION   PROCEDURE:  Cardiac catheterization and percutaneous coronary intervention.   CLINICAL HISTORY:  [name] is 65 years old and works in [employer] in  [place].  He has no prior history of any known heart disease and was  admitted to the hospital yesterday with chest pain and had enzymes  consistent with a non-ST elevation infarction.  His EKG showed nonspecific  ST-T changes and Q-waves in V1 and V2.   DESCRIPTION OF PROCEDURE:  The procedure was performed via the right femoral  artery using an arterial sheath and 6 French preformed coronary catheters.  A front wall arterial puncture was performed and Omnipaque contrast was  used.  We had difficulty engaging the right coronary artery and finally we  were able to do this with an AL1 diagnostic catheter.   After completing the diagnostic study we made a decision to proceed with  intervention on the right coronary artery and possibly the circumflex  artery.  The patient was given additional heparin to prolong the ACT to  greater than 200 seconds and was given double-bolus Integrilin and infusion  and was given 300 mg of Plavix.  We tried multiple guiding catheters but  finally were able to be successful with an AL1 6 [place] guiding catheter  with side holes.  We also used a PT2 wire but had difficulty advancing the  wire all the way down to the vessel due to the guiding catheter support and  finally we were able to do this with a Whisper wire.  We then went in with a  2.5 x 20 mm Maverick and performed two inflations up to 8 atmospheres  for 30  seconds.  We then deployed a 2.5 x 24 mm F7 stent.  We had difficulty  advancing the stent around the bends in the proximal vessel but were finally  able to do this.  We then deployed the stent with one inflation of 15  atmospheres for 30 seconds.  Repeat diagnostics were then performed through  the guiding catheter.   We decided not to do the circumflex artery because of the difficulty and  length of the procedure and the amount of contrast used with the right  coronary artery intervention.   The right femoral artery was closed with AngioSeal at the end of the  procedure.   RESULTS:  1. Left main coronary artery:  The left main coronary was free of     significant disease.  2. Left anterior  descending artery:  The left anterior descending gave rise     to three septal perforators and three diagonal branches.  This vessel was     irregular, but there was no major obstruction.  3. Left circumflex artery:  The left circumflex gave rise to a small     intermediate branch and two large posterolateral branches.  There was 80%     narrowing in the mid-circumflex artery.  There was 50% narrowing in the     distal circumflex artery before the posterolateral branches.  4. Right coronary artery:  The right coronary artery had an anterior and     inferior-directed takeoff and had several sharp bends in its proximal     portion with diffuse disease with focal narrowing of 90%.  There were a     conus and atrial branch that arose from the lesions.  The right coronary     artery then gave rise to a right ventricular branch, a posterior     descending branch, and two small posterolateral branches.  The distal     vessel was irregular but free of major obstruction.  5. Left ventriculogram:  The left ventriculogram performed in RAO projection     showed mild inferior wall hypokinesis.  The estimated ejection fraction     was 60%.  The aortic pressure was 148/17, left ventricular pressure  was     148/80.  6. Following stenting of the lesion in the proximal right coronary artery,     the stenosis improved from 90% to less than 10%.   CONCLUSION:  1. Coronary artery disease, status post recent non-ST segment elevation     myocardial infarction with irregularities in the left anterior descending     artery, 80% mid- and 50% distal stenosis in the circumflex artery, 90%     stenosis in the proximal right coronary artery, with mild inferior wall     hypokinesis and an ejection fraction estimated at 60%.  2. Successful stenting of the proximal right coronary artery with a bare     metal stent with improvement in sentinel narrowing from 90% to less than     10%.   DISPOSITION:  The patient was returned to the post-angioplasty unit for  further observation.  Will plan to intervene on the circumflex artery in two  days on Wednesday.                                               Charlies Constable, M.D.    BB/MEDQ  D:  05/02/2003  T:  05/02/2003  Job:  [id number]   cc:   Madaline Guthrie, M.D.  [address]  [place] [address]  Fax: [phone]   Cardiopulmonary Lab

## 2010-11-02 NOTE — Assessment & Plan Note (Signed)
[facility]                         GASTROENTEROLOGY OFFICE NOTE   SALATHIEL, FERRARA                      MRN:          [id number]  DATE:07/22/2006                            DOB:          [birth year]    REFERRING PHYSICIAN:  Lajuana Matte, MD   CHIEF COMPLAINT:  Abnormal PET scan.   REFERRING PHYSICIAN:  Lajuana Matte, MD.   ASSESSMENT:  Increased soft tissue attenuation in the region of the  cecum.  This is on PET scan.  Radiologist has suggested possibly a  significant colon finding, though admittedly it is nonspecific.  I did  remove a cecal polyp in 2006.  He recently had lung cancer diagnosed and  resected, and had a protracted hospital course with respiratory failure,  which he has recovered from.   PLAN:  Go ahead with colonoscopy to look for other polyps.  I originally  planned to do one 3 years from initial colonoscopy, but given the  possible abnormality on the PET scan, will go ahead and evaluate now.  Risks, benefits, and indications are explained.  He agrees and agrees to  proceed.   HISTORY:  This is a 65 year old African-American man who has the  problems as outlined above.  He does not have any bowel movement changes  or any problems at this time.  He got out of the hospital, having been  discharged from rehab facility 3 weeks ago.  He is gradually regaining  his strength.  He has not complained of bowel habit changes or problems.  He had the PET scan April 24, 2006, which showed increased soft tissue  attenuation in the region of the cecum as described above.   PAST MEDICAL HISTORY:  1. Short-segment Barrett's esophagus.  2. Coronary artery disease with coronary intervention and bare metal      stenting in the past now with a subsequent drug-eluting stent.  His      Plavix has been discontinued.  3. Chronic obstructive pulmonary disease with continued cigarette use      in the past.  4. Hypertension.  5.  Dyslipidemia.  6. 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      Dorris Fetch.  7. Right hemothorax.  8. Acute renal failure.  9. Ventilation-dependent respiratory failure with acute respiratory      distress syndrome.  10.Methicillin-resistant Staphylococcus aureus on blood cultures      May 03, 2006.  11.Acute blood loss anemia secondary to his surgery.  12.Malnutrition.  He did have a gastrostomy tube placed by Dr. Christella Hartigan.      That has been removed.  13.Klebsiella pneumonia.  14.Alcoholism and delirium tremens.  15.Gout.  16.Dyslipidemia.  17.History of congestive heart failure.  18.History of inguinal hernia repair.   MEDICATIONS:  1. Protonix 40 mg twice daily.  2. Vytorin 10/80 daily.  3. Aspirin 325 mg daily.  4. Lopressor 25 mg daily.  5. Lisinopril 20 mg daily.  P.r.n. medications colchicine, indomethacin, and Vicodin.   DRUG ALLERGIES:  None known.   SOCIAL HISTORY:  As described above.   REVIEW OF SYSTEMS:  He is gradually regaining his strength.  He is off  of oxygen and is walking under his own power.  He describes some  anorexia that is improved, but still bothersome to him.  He denies any  significant dyspnea, though he says he is not exerting himself.   PHYSICAL EXAM:  Well-developed, middle-aged black man in no acute  distress.  Height 6 feet 1 inch, weight 183 pounds, pulse 78, blood pressure  100/70.  EYES:  Anicteric.  NECK:  Supple.  CHEST:  Clear.  Decreased breath sounds in the right upper lobe area.  HEART:  S1, S2.  I hear no rubs or gallops.  The chest wall is  nontender.  ABDOMEN:  Soft.  Nontender.  No organomegaly or mass.  Bowel sounds are  present.  SKIN:  Warm and dry without acute rash.  He is alert and oriented x3.   I appreciate the opportunity to care for this patient.     Iva Boop, MD,FACG  Electronically Signed    CEG/MedQ  DD: 07/22/2006  DT: 07/22/2006  Job #: [id number]   cc:   Lajuana Matte, MD

## 2010-11-02 NOTE — Op Note (Signed)
NAME:  [name], [name]                         ACCOUNT NO.:  [id number]   MEDICAL RECORD NO.:  [id number]                   PATIENT TYPE:  AMB   LOCATION:  DSC                                  FACILITY:  [facility]   PHYSICIAN:  Anselm Pancoast. Zachery Dakins, M.D.          DATE OF BIRTH:  03-Jul-1945   DATE OF PROCEDURE:  12/13/2003  DATE OF DISCHARGE:                                 OPERATIVE REPORT   PREOPERATIVE DIAGNOSIS:  Large right scrotal inguinal hernia.   OPERATION:  Right inguinal herniorrhaphy with mesh reinforcement.   POSTOPERATIVE DIAGNOSIS:  Large indirect sliding right inguinal hernia.   HISTORY:  [name] is a 65 year old male, referred by [facility] for a  large right groin hernia that has been present 15+ years.  On examination,  this is kind of a small football-size hernia, and it is reducible, and I had  hoped that this would be small bowel within the hernia.   The patient was induced with general anesthesia MOA.  The right groin area  was first clipped and then prepped with Betadine surgical scrub and draped  in a sterile manner.  I infiltrated the incision area with Marcaine with  adrenalin and Xylocaine mixture and then also numbed the ilioinguinal nerve  area with approximately 10 mL of the solution.  A small right groin incision  was made, sharply dissected through the skin, subcutaneous tissue.  The  external ring was completely destroyed because of the large hernia.  I  identified the fascia laterally and opened up about 1.5 inches.  Then the  cord structures were elevated.  The hernia appeared to be an indirect  hernia, but it was enormous, and I separated the hernia sac and contents  from the cord structures and then kind of carefully opened into the medial  aspect superior.  The hernia itself was basically a large slider, and it was  probably 8-10 inches of cecum, terminal ileum, and all in the posterior  wall, and this was freed up circumferentially so I  could flip this back into  the peritoneal cavity.  The hernia sac then, after trimmings had been done,  was actually closed with 2-0 Vicryl, taking care that I did not constrict  anything as far as the colon is definitely within the peritoneal cavity.  Next, there was one little area that when I was trimming up the hernia sac  in the posterior wall where one little tinea was opened.  You could never  see any mucosa, and this was closed with about 3 sutures of 3-0 Lembert  sutures.  Next, the medial aspect, I was very careful that there was not a  little piece of the bladder up in this, and this was freed up, and then the  floor was closed with 2-0 Vicryl.  Next, the medial aspect was completely  broken down, was closed with a running 2-0 Prolene, kind of reinforcing the  floor,  recreating the internal ring area.  Next, a piece of Prolene mesh  shaped like a sail slip laterally was used to reinforce the floor.  The  inferior limb was started at the symphysis pubis and sutured to the inguinal  ligament.  The two tails were sutured together laterally, and then the  superior flap was sutured down with interrupted sutures so it is lying in  the area under no tension.  The cord structures which had then been not  really skeletonized but down to normal size were back in their normal  position.  Good hemostasis, and then the external oblique was closed with a  running 2-0 Vicryl and then some 3-0 Vicryl interrupted for Scarpa's fascia.  A 4-0 Vicryl subcuticular and Benzoin and Steri-Strips on the skin.  The  patient tolerated the procedure nicely and was taken to the recovery room in  a satisfactory postoperative course.  We will see him back in the office in  approximately a week.  I am going to make sure that he kind of stays on a  liquid diet for approximately 24-48 hours until he starts having bowel  function.                                               Anselm Pancoast. Zachery Dakins, M.D.    WJW/MEDQ  D:  12/13/2003  T:  12/13/2003  Job:  [id number]

## 2010-11-02 NOTE — Discharge Summary (Signed)
[name], [name]               ACCOUNT NO.:  [id number]   MEDICAL RECORD NO.:  [id number]          PATIENT TYPE:  INP   LOCATION:  5511                         FACILITY:  [facility]   PHYSICIAN:  Manning Charity, MD     DATE OF BIRTH:  12/14/1945   DATE OF ADMISSION:  04/19/2006  DATE OF DISCHARGE:  04/25/2006                               DISCHARGE SUMMARY   DISCHARGE DIAGNOS3S:  1. Right upper lobe cavitary lung lesion.  2. Coronary artery disease status post stent to the right coronary      artery which was non-drug-eluting, and a drug-eluting stent to the      circumflex in November of 2004.  3. Gastroesophageal reflux disease with Barrett's esophagus.  4. Tobacco abuse.  5. Alcohol abuse.  6. Gout.  7. Hypertension.  8. Hyperlipidemia.  9. Status post inguinal hernia repair.  10.History of diverticulosis.  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  12.History of acute renal failure on an ARB.  13.Question history of congestive heart failure.   DISCHARGE MEDICATIONS:  1. Metoprolol 25 mg p.o. b.i.d.  2. Lisinopril 20 mg p.o. daily.  3. Protonix 40 mg p.o. b.i.d.  4. Vytorin 10/80 one p.o. daily.  5. Colchicine 0.6 mg p.o. b.i.d.  6. Indomethacin 25 mg p.o. q.8h. p.r.n.  (The patient was instructed to discontinue atenolol, aspirin, Plavix,  Atacand and amlodipine until seen by his primary care physician.)   CONDITION ON DISCHARGE:  Stable and improved.   FOLLOWUP:  1. The patient was instructed to return to [facility] on      April 28, 2006 as previously arranged, to be readmitted by Dr.      Dorris Fetch from [facility] for a wedge resection of the right upper lobe      cavitary lung lesion.  2. The patient was also instructed to call the [facility] outpatient      clinic in approximately 1 month for a hospital followup appointment      with his primary care physician, Dr. Landis Martins.   PROCEDURES:  1. CT chest on April 19, 2006 showed a  5.2 cm thick-walled cavitary      lesion in the right upper lobe.  Given the wall thickness and      adjacent satellite nodules, features are most concerning for      primary bronchogenic neoplasm, although a cavitary infection cannot      be completely excluded.  Cholelithiasis.  Small hiatal versus      paraesophageal hernia.  2. Nuclear medicine adenosine stress Myoview study on April 23, 2006      which showed with adenosine infusion there is no marked EKG change.      The nuclear images revealed no evidence of significant scar or      ischemia.  There is good wall motion.  There is no sign of ischemia      at this time.  3. PET CT scan on April 24, 2006 revealed necrotic right upper lobe      mass that was intensely hypermetabolic consistent with primary  lung      cancer.  No evidence for hypermetabolic hilar or mediastinal lymph      node metastasis.  No evidence for hypermetabolic metastasis to the      abdomen or pelvis.  Increased soft tissue and the cecum.  Recommend      correlation with colon cancer screening.   CONSULTANTS:  1. Dr. Dorris Fetch from [facility]  2. Dr. Dietrich Pates from cardiology.   ADMITTING HISTORY AND PHYSICAL:  Please see the chart for full details.  In summary, [name] is a 65 year old African-American man with a past  medical history of coronary artery disease, gout, tobacco abuse, alcohol  abuse, dyslipidemia, CHF, aspiration pneumonia and right scrotal  inguinal hernia repair, who had been on a drinking binge for 2 days.  He  states he had been swimmy-headed and that he had been out of some of  his medications, question Protonix, for 2 weeks.  His reflux has  worsened.  He comes to the emergency department with slurring speech and  is obviously intoxicated.  He has had chest pain off and on for 3 years.  He denies any fever or chills, but has had a little cough with  occasional sputum production.  He continues to smoke.  He was found to  have large  cavitary lesion on chest x-ray with no previous mention in  the emergency department on admission.   ADMISSION PHYSICAL EXAMINATION:  VITAL SIGNS:  Temperature 97.6, blood  pressure 127/79, pulse 74, respiration rate 16, oxygen saturation 94% on  room air.  Pertinent physical exam findings include:  GENERAL:  He was mildly intoxicated but awake, alert, and oriented x3.  He did have some scleral icterus on his eye exam.  NECK:  His neck was supple.  LUNGS:  He had no rhonchi, no wheezing, no consolidation was  appreciated, with good air movement.  HEART:  Regular rate and rhythm with no murmurs appreciated.  LYMPH NODES:  He had no lymphadenopathy in the axilla or neck.  NEUROLOGIC:  He was intact except for an unsteady gait due to alcohol  intoxication.   LABORATORIES ON ADMISSION:  Alcohol level of 275, white blood cell count  of 6.2, hemoglobin 13.3, hematocrit 39.7, MCV 95.1, RDW 14.7, platelets  198.  Sodium 143, potassium 3.6, chloride 112, bicarbonate 23, glucose  113, BUN 13, creatinine 1.1, bilirubin 0.4, alkaline phosphatase 69.  AST 25, ALT 14, total protein 7.3, albumin 3.5, calcium 8.7.  CK was  303.  CK-MB 4.4.  Relative index 1.5, troponin I of 0.03. Sputum culture  was obtained at that time.   HOSPITAL COURSE:  1. Right upper lobe cavitary lung lesion.  The patient was admitted      for chest pain, and, given his extensive history of coronary artery      disease with prior stenting, he was ruled out for an acute MI.  The      process was begun in the emergency department with a chest x-ray to      rule out a widened mediastinum; however, it was noted incidentally      that he had a right upper lobe cavitary lung lesion.  Because of      this finding, he went immediately to a CT scan which did delineate      this cavitary lung lesion that was felt to be more suspicious for a      neoplasm than an infectious etiology.  Because of his risk  factors     including alcohol  abuse, he was put into isolation initially, and      an acid fast stain was obtained to rule out tuberculosis, of which      only one was obtained and it was negative.  Isolation was      discontinued, and he was continued on antibiotic treatment just in      case this was an infection until a PET CT scan was obtained.  The      patient was afebrile throughout his hospitalization and never      mounted a leukocytosis, so infection was seen to be less likely.      We did obtain a sputum culture which grew out normal oropharyngeal      flora.  We also obtained blood cultures which were negative x2 at 5      days.  Infection certainly seemed less likely, so antibiotics were      discontinued prior to discharge.  We did obtain a consultation from      Dr. Dorris Fetch from [facility] who elected to do a right upper lobe      wedge resection on the patient after he had adequate preoperative      evaluation.  We did obtain pulmonary function tests, as well as a      cardiology consult to prepare the patient for surgery.  He was      cleared for surgery, and since he was medically stable, the patient      was discharged on April 25, 2006 with arrangements previously      made for the patient to return to the hospital on April 28, 2006      to be directly admitted under Dr. Sunday Corn service.  At that      time, he was to have a wedge resection of this right upper lobe      cavitary lesion.  Of note, his PET CT did show hypermetabolic      activity in that right upper lobe cavitary lesion.  There were no      metastatic nodules or anywhere else.  There was hypermetabolic      activity noted in the cecum, which could be from the adenomatous      polyp seen on a colonoscopy done by Dr. Leone Payor in 2006.      Interestingly, the patient had not had any complaints of weight      loss, fevers, chills or night sweats at home.  The patient was      strongly encouraged to discontinue using alcohol and  tobacco      products.   1. Coronary artery disease with a history of stenting to the right      coronary artery and circumflex in 2004 with 1 drug-eluting and 1      non- drug-eluting stent.  Cardiology consult was obtained for      preoperative clearance per Dr. Sunday Corn request.  The patient      did have chest pain on admission, but active coronary artery      disease was ruled out with cardiac enzymes that were negative x3      with no EKG changes noted, as well.  It was recommended by Dr. Myrtis Ser      for the patient to undergo a Myoview stress test to rule out      reversible ischemia prior to the surgery.  The Myoview was  essentially negative.  There was no reversible or irreversible      ischemia noted.  The patient's Plavix was stopped prior to      discharge in order to prepare him for the surgery.  He was also      instructed not to continue his Plavix and not to take any aspirin      products upon discharge.  After the surgical resection, the patient     will need to go back on his Plavix when it is deemed appropriate by      cardiology.   1. Gastroesophageal reflux disease with Barrett's esophagus.  The      patient has had an EGD in the past by Dr. Leone Payor approximately in      2006 which did reveal Barrett's esophagus, but no malignancy was      identified.  He was supposed to have been on Protonix 40 mg daily      as an outpatient.  However, it was noted on admission that he may      have been out of this medication for approximately 2 weeks.  This      is most likely the cause of the patient's presenting complaint of      chest pain; therefore, his Protonix was increased to 40 mg b.i.d.      His chest pain resolved quickly once the Protonix was restarted.      We did also obviously rule out a cardiac source of his chest pain.      as well.   1. Gout.  The patient did have a mild gout flare while hospitalized      which just required colchicine and  indomethacin to be restarted on      the patient.  He uses colchicine and indomethacin on a p.r.n. basis      as an outpatient, as well.  The gout flare resolved quickly once      the appropriate medications were restarted.   1. Tobacco and alcohol abuse.  The patient was counseled extensively      regarding his alcohol and tobacco abuse and the need for him to      cease using tobacco and alcohol.  The patient expressed      understanding.   1. The rest of the patient's chronic medical problems were not active      during this hospitalization and, therefore, will not be discussed      in this dictation.   DISCHARGE LABORATORIES AND VITAL SIGNS:  On the day of discharge, white  blood count was 6.0, hemoglobin 13.1, hematocrit 38.9, MCV 95, RDW 14.4  and platelets 207.  Sodium 138, potassium 3.9, chloride 102, bicarbonate  24, glucose 97, BUN 8, creatinine 1.0, calcium 8.8.  HIV antibody was  nonreactive.   PENDING LABORATORIES:  None.      Chauncey Reading, D.O.  Electronically Signed     ______________________________  Manning Charity, MD    EA/MEDQ  D:  05/28/2006  T:  05/28/2006  Job:  [id number]   cc:   Salvatore Decent. Dorris Fetch, M.D.  Pricilla Riffle, MD, [facility]

## 2010-11-02 NOTE — Discharge Summary (Signed)
NAME:  [name], [name]                         ACCOUNT NO.:  [id number]   MEDICAL RECORD NO.:  [id number]                   PATIENT TYPE:  INP   LOCATION:  3727                                 FACILITY:  [facility]   PHYSICIAN:  Kerby Nora, MD                     DATE OF BIRTH:  1945/06/22   DATE OF ADMISSION:  08/13/2003  DATE OF DISCHARGE:  08/15/2003                                 DISCHARGE SUMMARY   PRIMARY CARE PHYSICIAN:  Catalina Pizza, M.D. with [facility].   DISCHARGE DIAGNOSES:  1. Chest pain.  2. Question aspiration pneumonia.  3. Hypotension.  4. Coronary artery disease, history of non-ST elevated MI in November 2004,     cardiac cath showing 80% mid and 50% distal stenosis in circumflex     artery, 90% stenosis in RCA with successful stenting, decreased stenosis     to less than 10%, mild inferior wall hyperkinesis and EF of 60% in     November 2004.  5. Status post stenting of mid circumflex on May 04, 2003.  6. History of congestive heart failure.  7. History of gout.  8. History tobacco abuse.  9. History of dyslipidemia.  10.      Alcohol abuse.   DISCHARGE MEDICATIONS:  1. Plavix 75 mg p.o. q. day.  2. Pravachol 80 mg p.o. q. day.  3. Protonix 40 mg p.o. q. day.  4. Lisinopril 10 mg p.o. q. day.  5. Atenolol 50 mg p.o. q. day.   FOLLOWUP APPOINTMENTS:  1. Follow up with Dr.  on August 31, 2003 at 2:30 p.m.  2. Follow up with Dr. Juanda Chance, his cardiologist, on March 30, at noon.   PROCEDURES:  1. 08/13/2003, portable chest x-ray:  Left base pneumonia vs scarring and     factors related to portable AP technique.  2. 08/13/2003, PA and lateral chest x-ray:  Interval clearing of left     basilar atelectasis, mild residual opacity, similar to that demonstrated     in November and compatible with scarring, no acute finding seen.  3. EKG:  No changes from previous EKG.   BRIEF ADMISSION HISTORY AND PHYSICAL:  The patient is a 65 year old male  with history of coronary artery disease, status post stenting in November,  who had experienced chest pain since 6:00 p.m. the day prior to admission.  The patient did not recall much about the chest pain and was inebriated on  admission.  The patient was chest pain free in the Emergency Room.  The  patient did recall some shortness of breath during the chest pain.  The  chest pain improved in route when given nitroglycerin by EMS.   HOSPITAL COURSE BY PROBLEM LIST:  PROBLEM #1 - CHEST PAIN:  The patient's  chest pain was concerning for angina, given his history of coronary artery  disease and stenting.  There  were no abnormalities by EKG or enzymes.  The  patient remained pain free throughout his hospitalization.  He was monitored  and given his aspirin and Plavix.  Given the patient's high risk for re-  occlusion within six months of his stent placement, it was decided to have  the patient get a Cardiolite.  Given the fact that he was in a chest pain  free state, it was deemed appropriate for the patient to receive the  Cardiolite as an outpatient.  He was discharged chest pain free and set up  with a followup appointment with his cardiologist.   PROBLEM #2 - QUESTION OF ASPIRATION PNEUMONIA:  There was a question of an  infiltrate on chest x-ray, but the patient was asymptomatic.  Of note, the  patient was acutely intoxicated on admission and so therefore had risk  factors for aspiration pneumonia.  He was initially treated with Zosyn.  After the patient remained afebrile and asymptomatic, it was deemed that  this was not a true diagnosis and the patient was not continued on  antibiotics.   PROBLEM #3 - HYPOTENSION:  The patient did have some transient hypotension  on admission.  This improved with hydration, with clearing of the patient's  acutely intoxicated state and withholding his lisinopril and atenolol.  The  patient's blood pressure medications were added back without any  problem.   PROBLEM #4 - ALCOHOL ABUSE:  The patient showed no signs and symptoms of  withdrawal throughout his hospital stay.  He was given thiamin and folate.  He was given numbers for AA and [facility].   PROBLEM #5 - CONGESTIVE HEART FAILURE:  This remained stable throughout the  patient's hospital stay.  He is currently on no diuretic.  No changes were  made to his home regimen.   PROBLEM #6 - TOBACCO ABUSE:  The patient was maintained on a nicotine patch  and was given smoking cessation counseling.                                                Kerby Nora, MD    AB/MEDQ  D:  09/04/2003  T:  09/05/2003  Job:  [id number]   cc:   Catalina Pizza, M.D.  [address] [place], [place] [address]  Fax: [phone]

## 2010-11-02 NOTE — Discharge Summary (Signed)
NAME:  [name], [name]                         ACCOUNT NO.:  [id number]   MEDICAL RECORD NO.:  [id number]                   PATIENT TYPE:  INP   LOCATION:  6532                                 FACILITY:  [facility]   PHYSICIAN:  Hillery Aldo, M.D.                DATE OF BIRTH:  03-26-1946   DATE OF ADMISSION:  05/01/2003  DATE OF DISCHARGE:  05/05/2003                                 DISCHARGE SUMMARY   DISCHARGE DIAGNOSES:  1. Non-ST-segment-elevation acute myocardial infarction.  2. Coronary artery disease.  3. Hypokalemia.  4. Cardiomegaly.  5. Acute congestive heart failure.  6. History of gout.  7. Tobacco abuse, 40-pack years.  8. Dyslipidemia.   DISCHARGE MEDICATIONS:  1. Plavix 75 mg p.o. daily.  2. Aspirin 325 mg daily.  3. Pravachol 80 mg daily.  4. Altace 2.5 mg daily.  5. Protonix 40 mg p.o. daily.  6. Atenolol 50 mg p.o. daily.   BRIEF ADMISSION HISTORY AND PHYSICAL:  Patient is a 65 year old male with  know significant past medical history who was admitted on May 01, 2003  after he presented to the emergency department with what was described as  atypical chest pain.  He was subsequently found to have a non-ST-segment-  elevation MI.  His symptoms initially began the night before when he  developed some nausea and chest pain at approximately 11 p.m., which awoke  him from sleep.  He described the pain as having a heartburn-type sensation  and was not associated with any radiation.  He did not report any  diaphoresis.  He did have some shortness of breath.  Patient reported that  his chest pain resolved approximately an hour later after he took some  baking soda for which he thought was his heartburn.  At approximately 4  a.m., he had recurrence of his chest pain after eating a cookie.  He  described the pain as being fairly severe, a level 8/10, associated with  shortness of breath.  He reported to the emergency department by EMS for  further evaluation and  workup.  En route to the hospital, he did receive  aspirin and nitroglycerin.  His pain was resolved by approximately 6 a.m.  Initial cardiac enzymes in the emergency department were negative.  He was  admitted for cardiac evaluation to rule out acute MI.  His second set of  cardiac enzymes turned positive, and cardiology was consulted for further  diagnostic evaluation.   PROCEDURES/DIAGNOSTIC STUDIES:  1. A transthoracic echocardiogram on May 02, 2003.  Results showed     intact left ventricular systolic function with a normal ejection     fraction, estimated to be 55-65%.  2. Cardiac catheterization on May 02, 2003.  Results showed an     irregular LAD, 80% blockage to the mid circumflex and 50% blockage     distally, a 90% proximal blockage of the right coronary artery with  slight inferior hypokinesis noted at the left ventricle.  Ejection     fraction was estimated to be 60%.  Patient underwent stenting of the     right coronary artery from a 90% occlusion to a less than 10% stenosis.  3. Repeat cardiac catheterization.  When the patient's cardiac     catheterization was repeated on May 04, 2003 secondary to needing     additional stenting and the need to avoid overloading the patient with     contrast dye.  During this catheterization, his circumflex was stented,     and the 80% lesion was reduced to 0.   HOSPITAL COURSE:  Problem #1:  Chest pain:  The patient was admitted for  full cardiac evaluation.  His initial POC enzymes in the emergency  department were negative x3.  The cardiology consult was obtained.  His  second set of enzymes eight hours later did turn positive, and the diagnosis  of acute MI was made.  Patient underwent the above-mentioned procedures  without difficulty.  During the course of his hospitalization, he had no  further complaints of chest pain.  He was stable for discharge on the 18th.  He was discharged on Plavix for six months and a  daily aspirin.  Problem #2:  Hypokalemia:  Patient's hypokalemia resolved with aggressive  replacement.  At the time of discharge, his potassium was stable at 3.6.  Problem #3:  Tobacco abuse:  The patient was encouraged to discontinue  smoking during the course of his hospitalization.  Problem #4:  Hyperlipidemia:  Patient was started on statin therapy during  the course of his hospitalization.  His liver function studies were within  normal limits.   DISCHARGE LABORATORY DATA:  The patient's discharge laboratory data were as  follows:  White blood cell count 8.2, hemoglobin 13, hematocrit 38.7, MCV  93.7, platelets 201.  Sodium 138, potassium 3.6, chloride 105, bicarb 27,  glucose 156, BUN 9, creatinine 1.0, calcium 8.7.  Additionally, a  glycosylated hemoglobin was found to be 6.3.  Lipid profile showed a  cholesterol of 161, triglycerides 50, HDL 36, LDL 115.  His urine drug  screen was negative.  His urinalysis was unremarkable.   DISCHARGE PLAN:  The patient will follow up with Dr. Juanda Chance, his  cardiologist, on December 15th at 2 p.m.  Additionally, he will follow up  with [facility] on May 11, 2003 at 3 p.m. for  hospital followup.  Notably, the patient had been enrolled in the __________  study while in the hospital, and He was instructed to continue his 325 mg  aspirin daily for 30 days.  Additionally, the research nurse in the study  protocol will call him for followup.                                                Hillery Aldo, M.D.    CR/MEDQ  D:  06/24/2003  T:  06/25/2003  Job:  [id number]

## 2010-11-02 NOTE — Op Note (Signed)
NAME:  [name], [name]               ACCOUNT NO.:  [id number]   MEDICAL RECORD NO.:  [id number]          PATIENT TYPE:  INP   LOCATION:  2314                         FACILITY:  [facility]   PHYSICIAN:  Sigmund I. Patsi Sears, M.D.DATE OF BIRTH:  10/20/1945   DATE OF PROCEDURE:  05/24/2006  DATE OF DISCHARGE:                               OPERATIVE REPORT   PREOPERATIVE DIAGNOSIS:  Massive genitourinary bleed.   POSTOPERATIVE DIAGNOSIS:  Massive genitourinary bleed.   OPERATION:  Flexible cystourethroscopy.   SURGEON:  Sigmund I. Patsi Sears, M.D.   FINDINGS:  1. Clot within the bladder, irrigated.  2. No urethral trauma.   REVIEW OF HISTORY:  This 65 year old male has had gross hematuria for  several weeks with continuing decrease in hemoglobin (7.1 this morning).  He is status post right upper lobectomy, with repeat surgery for  drainage of hematoma.  His hospitalization was complicated by acute  renal failure and ARDS and massive urinary bleeding with a normal renal  ultrasound, no hydronephrosis and no masses.   PROCEDURE:  With the patient in the dorsal supine position, the penis is  prepped with Betadine solution and draped in the usual fashion.   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  The flexible cystoscope is  passed into the urethra, which shows no evidence of urethral trauma.  The bladder neck is normal.  Within the bladder, there is a large amount  of clot.  There is no evidence of bladder cancer, however.  There are no  bladder stones seen.  The ureteral orifices are seen, but I do not  detect urine coming from the orifices.   A guide wire is placed and a 24 Ainsworth catheter was placed with 30 cc  in the balloon.  The bladder was irrigated free of clots.   The patient has been considered for Amicar usage, but I do not think he  will need it at this time.  I have discussed the case with Dr.  Tressie Stalker this morning.      Sigmund I. Patsi Sears, M.D.  Electronically Signed     SIT/MEDQ  D:  05/24/2006  T:  05/24/2006  Job:  [id number]   cc:   Salvatore Decent. Dorris Fetch, M.D.  Salvatore Decent. Cornelius Moras, M.D.

## 2010-11-02 NOTE — Discharge Summary (Signed)
[name], [name]               ACCOUNT NO.:  [id number]   MEDICAL RECORD NO.:  [id number]          PATIENT TYPE:  INP   LOCATION:  3311                         FACILITY:  [facility]   PHYSICIAN:  Salvatore Decent. Dorris Fetch, M.D.DATE OF BIRTH:  Feb 06, 1946   DATE OF ADMISSION:  04/28/2006  DATE OF DISCHARGE:                               DISCHARGE SUMMARY   ADDENDUM:  The patient's discharge was held due to the patient not being able to  find a skilled nursing facility that will accept Medicaid. Yesterday on  June 19, 2006, SNF search was expanding to surrounding counties. Bed  offers will be provided to the family as received by social work.  Tentatively, the patient may be discharged home in the next two to three  days.   The patient's agitation has been improving each day. The patient is much  more alert and is speaking. The patient's blood pressure did rise  January 2, and his blood pressure and medications were changed. The  patient's blood pressures are now much improved around the 114s/60s. The  patient is continuing aggressive physical therapy and occupational  therapy due to the patient being more awake. 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   The patient did start honey-thick liquids on June 18, 2006. He is  eating small diet with full supervision and assist to follow aspiration  precautions. The patient is tolerating the PMV, and this is placed prior  to all p.o. Increased wean of PMV with supervision was initiated on  June 18, 2006. The patient is also taking tube feeds via PEG, Jevity  1.2 at 75 mL an hour. The patient has free water flushes 20 mL every 6  hours. The patient is currently on D2 honey diet with full supervision.  Speech pathology saw the patient on June 19, 2006 with the patient  with PMV in place, clear voice quality. The patient is tolerating the  diet and does require cues to swallow two times during trials.   Overall, the patient  has been progressing well in a positive manner. His  labs have remained stable, and he has remained afebrile. Labs on June 19, 2005 were:  White blood count of 12.9, hematocrit 32, platelet count  284. BMP shows sodium of 138, potassium 4.2, chloride 103, CO2 28, BUN  28, creatinine 1.3, and glucose of 128. The patient is very alert and  cooperative.   DISCHARGE MEDICATIONS:  1. B vitamin B1 thiamine 100 mg through the tube daily.  2. Folic acid 1 mg through the tube daily.  3. Ferrous sulfate 200 mg through the tube daily.  4. Protonix 40 mg through the tube daily.  5. Aranesp 200 mcg subcu every Thursday.  6. Lamisil topical 1% cream b.i.d.  7. Potassium chloride 40 mEq through the tube daily.  8. Fentanyl patch 25 mcg apply every 72 hours and remove old patch      before applying new dose.  9. Lasix 40 mg through the tube daily.  10.Lopressor 12.5 mg through the tube b.i.d.  11.Seroquel 25 mg through  the tube b.i.d.  12.Tylox 1 to 2 tablets through the tube every 4 hours p.r.n.  13.Tylenol 650 mg through the tube every 4 hours p.r.n.  14.Haldol 2 to 5 mg IV every 1 hour p.r.n.  15.Xopenex 0.63 mg inhalation every 6 hours p.r.n.  16.Klonopin 0.5 mg through the tube b.i.d. p.r.n.   DIET:  The patient is instructed to follow a D2 honey with full  supervision diet. Use Thick-It instant food thickeners 227 g p.o. p.r.n.  The patient will continue free water flushes 200 mg through the tube  every 6 hours. The patient will continue tube feeds, Jevity 1.2 calories  1000 ml at 75 mL an hour. To continue aggressive physical therapy and  occupational therapy for strengthening.   The patient will be discharged to a skilled nursing facility hopefully  in the next two to three days.      Constance Holster, PA    ______________________________  Salvatore Decent Dorris Fetch, M.D.    JMW/MEDQ  D:  06/20/2006  T:  06/20/2006  Job:  [id number]   cc:   Salvatore Decent. Dorris Fetch, M.D.

## 2010-11-02 NOTE — Op Note (Signed)
[name], [name]               ACCOUNT NO.:  [id number]   MEDICAL RECORD NO.:  [id number]          PATIENT TYPE:  INP   LOCATION:  3307                         FACILITY:  [facility]   PHYSICIAN:  Salvatore Decent. Dorris Fetch, M.D.DATE OF BIRTH:  Sep 05, 1945   DATE OF PROCEDURE:  04/28/2006  DATE OF DISCHARGE:                                 OPERATIVE REPORT   PREOPERATIVE DIAGNOSIS:  Right upper lobe cavitary mass.   POSTOPERATIVE DIAGNOSIS:  Squamous cell carcinoma right upper lobe.   PROCEDURE:  Bronchoscopy, right VATS, wedge resection, right upper  lobectomy, mediastinal lymph node dissection.   SURGEON:  Salvatore Decent. Dorris Fetch, M.D.   ASSISTANT:  Coral Ceo, P.A. and Angelina Sheriff [place], [place]   ANESTHESIA:  General.   FINDINGS:  5 cm cavitary mass in right upper lobe.  Frozen section revealed  squamous cell carcinoma. Remainder of lobectomy completed without incident.  The mediastinal nodes difficult to access but grossly appeared benign.  Bronchoscopy was within normal limits.   CLINICAL NOTE:  [name] is a 65 year old gentleman who was recently  admitted with atypical chest pains.  Cardiac workup was negative but during  his workup he had a chest x-ray which showed a cavitary mass in his right  upper lobe. The CT scan confirmed this right upper lobe mass with possible  satellite nodules.  It was 5.3 cm in diameter.  The PET scan showed marked  hypermetabolic activity in the mass consistent with a primary lung cancer.  There was no mediastinal or hilar hypermetabolic uptake and no evidence of  distant metastases.   [name] was advised to undergo right bronchoscopy followed by right VATS  with a wedge resection of this lesion with intraoperative frozen section  determination and then proceed with lobectomy and node dissection if the  lesion was positive. The indications, risks, benefits and alternatives were  discussed in detail with the patient.  He understood and accepted  risks and  agreed to proceed.   OPERATIVE NOTE:  [name] was brought to the preop holding area on  April 28, 2006.  There the anesthesia service obtained central venous  access and arterial blood pressure monitoring line was placed.  Intravenous  antibiotics were administered.  PAS hose were placed for DVT prophylaxis.  The patient is taken to the operating room, anesthetized and intubated with  a single-lumen endotracheal tube.  Flexible fiberoptic bronchoscopy was  carried out and revealed normal endobronchial anatomy.  No endobronchial  lesions.   The patient was reintubated with a double-lumen endotracheal tube.  He was  placed in a left lateral decubitus position and the right chest was prepped  and draped in usual fashion. Single lung ventilation of the left lung was  carried out and was tolerated well throughout the procedure.   Incision was made in approximately the seventh intercostal space in the  midaxillary line and was carried through the skin and subcutaneous tissue.  Chest was entered bluntly using a hemostat.  A port was inserted and a  thoracoscope was placed into the chest.  There was air trapping in the lung.  Suction was applied to the right side of the dual-lumen endotracheal tube  and there was good isolation of the right lung. A small utility thoracotomy  was made in the anterior axilla and the skin, subcutaneous tissue were  divided with cautery.  The muscles were separated, no muscle was divided.  The incision was placed anterior to the latissimus dorsi muscle. The  intercostal muscles were divided with electrocautery. A second incision was  made just below the tip of the scapula and this was a port type incision  used for retraction and exposure. The right upper lobe was inspected.  There  were no adhesions between the visceral and parietal pleura.  There was no  significant pleural effusion.  The parietal pleura appeared normal. On the  visceral pleura  there was a large what appeared to be scarring on the  lateral aspect in the region where the mass was noted on CT scan. A large  wedge resection was done with multiple firings of Eshilon-60 stapler using  the green staple cartridge. The specimen was placed in an endoscopic  retrieval bag and the bag and specimen were removed through the anterior  utility thoracotomy. The specimen was sent for frozen section which returned  squamous cell carcinoma. Per our preoperative discussions decision was made  to proceed with right upper lobectomy.   Retracting the right upper lobe posteriorly the dissection was begun on the  anterior hilar pleural reflection. The tissue was slightly foreshortened in  this area which made the dissection more difficult.  The pulmonary vein  branches of the right upper lobe were identified and dissected free and then  stapled individually. Next the pulmonary arterial branches were dissected  out and likewise divided with ATW 35 stapler. Posterior ascending branch was  identified and divided also separately with an ATW 35 stapler. The bronchus  then was exposed, it was dissected free and an Eshilon-60 stapler with the  yellow staple load was placed across the right upper lobe bronchus and the  stapler closed. The test inflation revealed good aeration of the lower and  middle lobes. The stapler then was fired and the right upper lobe bronchus  was divided.  The minor fissure was incomplete but was completed with  Eshilon-60 stapler using a green cartridge and the major fissure then was  completed with two additional firings of the Eshilon-60 stapler. The  remaining specimen then was removed. Bronchial margins by frozen section  were free of tumor. The pleura was opened above the azygos vein, exposing  the level II and IV nodes and the fatty tissue containing the nodes in the region was removed and sent for permanent pathology. The chest was copiously  irrigated with warm  saline solution. Hemostasis was achieved.  An On-Q local  anesthetic catheter was placed in the subpleural location for continuous  administration of Marcaine local anesthetic. A 28-French right-angle tube  was placed posteriorly and a 28-French straight tube was placed anteriorly.  The posterior port incision was closed in two layers with a #1 Vicryl  fascial suture and a subcuticular skin suture.  The anterior utility  thoracotomy was closed with a running #1 Vicryl suture to reapproximate the  muscle layers followed by a 2-0 Vicryl subcutaneous suture and 3-0 Vicryl  subcuticular suture. The chest tubes were placed to suction. All sponge,  needle and instrument counts were correct at the end of the procedure.  The  patient was placed back in the supine position, was extubated in  the  operating room and taken to the recovery room in good condition.           ______________________________  Salvatore Decent Dorris Fetch, M.D.     SCH/MEDQ  D:  04/28/2006  T:  04/29/2006  Job:  [id number]   cc:   Emiliano Dyer, M.D.  Catalina Pizza, M.D.

## 2010-11-02 NOTE — Consult Note (Signed)
[name], [name]               ACCOUNT NO.:  [id number]   MEDICAL RECORD NO.:  [id number]          PATIENT TYPE:  INP   LOCATION:  5507                         FACILITY:  [facility]   PHYSICIAN:  Salvatore Decent. Dorris Fetch, M.D.DATE OF BIRTH:  [birth year]   DATE OF CONSULTATION:  04/21/2006  DATE OF DISCHARGE:                                   CONSULTATION   REASON FOR CONSULTATION:  Right upper lobe cavitary lesion.   HISTORY OF PRESENT ILLNESS:  [name] is a 65 year old gentleman who was  admitted on April 19, 2006.  According to the notes at that time, he came  in after a two-day drinking binge complaining of being swimmy headed.  He  also states he had been having chest discomfort and that had worsened and  was the reason he came for attention.  He has a history of both coronary  artery disease and gastroesophageal reflux but he described this as a  burning pressure-type sensation substernally which was not relieved with  belching.  He was admitted.  He ruled out for myocardial infarction by  enzymes but was found to have a right upper lobe mass on a chest x-ray.  CT  scan was done which showed a cavitary lesion in the right upper lobe.  There  was a question of a small satellite nodules in association with this lesion  and was 5.3 cm in diameter.  The patient has had some mild chest discomfort  since admission but no severe episodes.  He also has had over the past  couple of months a cough which has been productive of mucus but no  hemoptysis has been noted.  He has not noted any significant weight loss.   PAST MEDICAL HISTORY:  1. Significant for coronary artery disease status post non-Q-wave MI      treated with angioplasty and stenting in 2004 with one bare-metal stent      and one Taxus.  2. History of tobacco abuse of at least 40 pack years.  3. History of ethanol abuse.  4. Gastroesophageal reflux.  5. Gout.  6. Dyslipidemia.  7. Aspiration pneumonia.  8. Inguinal  hernia repair.   MEDICATIONS ON ADMISSION:  1. Atacand/hydrochlorothiazide 32/12.5 one tablet daily.  2. Aspirin 81 mg daily.  3. Atenolol 100 mg daily.  4. Amlodipine 10 mg daily.  5. Plavix 75 mg daily.  6. Protonix 40 mg daily.  7. Vytorin 10/80 one tablet daily.  8. He has been started on thiamine 100 mg daily.  9. Folic acid 1 mg daily.  10.Lisinopril 10 mg daily.  11.Clindamycin 300 mg IV q.6h.  12.Avelox 400 mg IV daily.   ALLERGIES:  No known drug allergies.   FAMILY HISTORY:  Significant for glaucoma, heart failure.   SOCIAL HISTORY:  He frequently uses ethanol.  He still smokes half-a-pack a  day; has greater than 40-pack-year history of smoking since age 44.  He is  divorced.  He lives with his brother.   There is questionable possible exposure to TB but no definite exposure.   REVIEW OF SYSTEMS:  Chest pain  indigestion, cough.  Sputum has been thick  but low volume.  No recent nausea and vomiting.  No fevers, chills or  sweats.  There is occasional tingling in his right arm and has some  occasional dizziness and was admitted with dizziness.  All other systems are  negative.   PHYSICAL EXAMINATION:  [name] is a 65 year old African American male in  no acute distress, afebrile, temperature 98.3, blood pressure 138/79, pulse  58, respirations are 20.  In general, he is well-developed and well-  nourished.  Neurologically, he is alert and oriented x3.  He is appropriate  and grossly intact.  HEENT exam significant for scleral icterus.  He is  edentulous.  His neck is supple without thyromegaly or adenopathy.  Lungs  are clear with equal breath sounds.  Cardiac exam has regular rate and  rhythm.  Normal S1-S2.  No rubs or murmurs.  Abdomen is soft, nontender.  Extremities have 2+ pulses with no significant edema.   LABORATORY DATA:  Sputum culture showed normal flora.  There were no acid-  fast bacilli seen on stains.  Sodium is 138, potassium 3.7, BUN and   creatinine 10 and 1.0, glucose is 95.  White count 6.3, hematocrit 39,  platelets 195.  Blood cultures have been no growth today.  His CK was 303  with MB of 4.4 with a normal relative index.  His troponin was 0.03.  LFTs  within normal limit.  Albumin 3.5.   Chest x-ray and chest CT as previously noted.   IMPRESSION:  [name] is a 65 year old gentleman who has a cavitary lung  mass in his right upper lobe.  This in his case could either be cavity due  to old an aspiration, previous infection or neoplasm.  There are some CT  characteristics which the radiologist feel make neoplasm possibility  including the thick-walled nature of the cyst as well as a couple of  satellite nodules in the vicinity.  In reviewing the CT, this lesion really  is relatively smooth-walled which would be an unusual appearance for a  cavitating lung mass, although it is not impossible.  Certainly is possible  to develop a neoplasm in the setting of a preexisting cavity.  I think to  further evaluate this lesion as well as to assess the mediastinum and  distant sites for metastases in case it is a neoplasm, a PET CT would be an  appropriate first step.   Secondly, [name] needs pulmonary function testing prior to any surgical  intervention.   His chest pain that he has been having is not pleuritic in nature and I  doubt seriously it is related to his lung mass.  It could be reflux.  He  does have a history of that but it also could be angina.  He did rule out  for myocardial infarction but does not rule out the possibly that this  discomfort he is having is cardiac in origin.  Given the fact that he has  had a previous MI, he  has stents in place, he would need formal cardiology  evaluation prior to any surgical intervention.   The final issue with [name] is that he is on Plavix for his coronary  stents.  This would need to be stopped five days prior to surgery.  However, because he does have an  antibiotic coated stent, this needs to be done with  precise timing as we would not want to go longer than five  days prior to any  operative intervention.   If and when [name]' condition improves to the point he is ready for  discharge, the remainder of this workup could be completed as an outpatient.           ______________________________  Salvatore Decent. Dorris Fetch, M.D.     SCH/MEDQ  D:  04/21/2006  T:  04/22/2006  Job:  [id number]   cc:   Emiliano Dyer, M.D.  Catalina Pizza, M.D.  Bruce Elvera Lennox Juanda Chance, MD, [facility]

## 2010-11-02 NOTE — Op Note (Signed)
NAMEJADDEN, YIM               ACCOUNT NO.:  [id number]   MEDICAL RECORD NO.:  [id number]          PATIENT TYPE:  INP   LOCATION:  2314                         FACILITY:  [facility]   PHYSICIAN:  Salvatore Decent. Dorris Fetch, M.D.DATE OF BIRTH:  06-26-1945   DATE OF PROCEDURE:  05/28/2006  DATE OF DISCHARGE:                               OPERATIVE REPORT   PREOPERATIVE DIAGNOSIS:  Prolonged ventilator and respiratory failure.   POSTOPERATIVE DIAGNOSIS:  Prolonged ventilator and respiratory failure.   PROCEDURE:  Tracheostomy with #8 Shiley tracheostomy tube.   SURGEON:  Salvatore Decent. Dorris Fetch, M.D.   ANESTHESIA:  General.   FINDINGS:  Normal anatomy.   CLINICAL NOTE:  Mr. Barnett is a 65 year old gentleman who had previously  undergone right VATS lobectomy.  This was complicated by a large  hemothorax and development of methicillin-resistant Staphylococcus  aureus pneumonia.  He had had a prolonged course on the ventilator,  marked by toxin metabolic encephalopathy and severe agitation.  He had  been extubated and initially had done well but had to be reintubated due  to mucus plugging and now is unable to wean due to agitation.  He  continues to have problems with pulmonary secretions. The patient's  family was advised that he should undergo tracheostomy and tube  placement. The indications, benefits, and alternatives were discussed  with the patient's sister, Alexia Freestone, and she understood the risks and  agreed to proceed.   OPERATIVE NOTE:  Mr. Oak was brought from the ICU to the operating  room on May 28, 2006.  He was already intubated and sedated, and  general anesthesia was induced. A shoulder roll was placed behind the  patient's shoulder to extend the neck, and the neck and chest were  prepped and draped in the usual sterile fashion.  A transverse incision  was made in the neck.  It was carried through the skin and subcutaneous  tissue.  The patient was divided with  electrocautery.  The strap muscles  were separated in the midline.  The trachea was a dissected out in the  midline just below the thyroid gland.  Palpation was performed, and the  second tracheal ring was identified.  Stay sutures were placed in the  second tracheal ring, and traction was placed on the stay sutures.  The  balloon on the endotracheal tube was deflated. The trachea was incised  and gently spread.  A #8 Shiley tracheostomy tube was placed through the  tracheostomy as the endotracheal tube was withdrawn. The inner cannula  was placed into the tracheostomy tube, and the ventilator apparatus was  attached. There was immediate transfer end-tidal CO2 noted, and the  patient maintained his saturations throughout the procedure. The  tracheostomy flange then was sutured to  the skin with heavy Prolene sutures, and the tracheostomy tie was placed  around the flange and secured around the patient's neck. The wound  around the tracheostomy tube was packed with Iodoform NuGauze to assist  with hemostasis.  The patient was then transported back to the surgical  intensive care unit in fair condition.  ______________________________  Salvatore Decent Dorris Fetch, M.D.     SCH/MEDQ  D:  05/28/2006  T:  05/29/2006  Job:  [id number]

## 2010-11-02 NOTE — Consult Note (Signed)
NAME:  [name], [name] NO.:  [id number]   MEDICAL RECORD NO.:  [id number]          PATIENT TYPE:  INP   LOCATION:  3307                         FACILITY:  [facility]   PHYSICIAN:  Luis Abed, MD, FACCDATE OF BIRTH:  1945-08-28   DATE OF CONSULTATION:  04/30/2006  DATE OF DISCHARGE:                                   CONSULTATION   REASON FOR CONSULTATION:  [name] is currently in the hospital,  having had surgery for a cavitating lung lesion.  He has known coronary  disease.  We are asked to see him to review his overall cardiac status.  He  also has had a decreased urine output this morning, and his blood pressure  has been decreased.  There is a question that his creatinine has gone from 1  to 3, and this is being repeated.   The patient does have known coronary disease.  He is status post a  myocardial infarction in 2004.  He had an 80% mid-circumflex and a 90% right  at that time.  He received a bare metal stent to the right, and following  this he had a drug-eluting stent to the circumflex.  He has been followed by  Dr. Everardo Beals. [place].  He was seen last in the office in May 2007, by Dr.  Juanda Chance, and he was stable.  The patient does have significant GI symptoms.  He has had a workup for a lung mass and has just had surgery.   This morning there is a question that his creatinine has gone from 1 to 3 in  24 hours.  A BMET is to be repeated; however, his pressure was on the low  side.  He is on dopamine and getting fluids.  He has not had any of his  usual chest pain.   The patient actually had been seen in consultation by Dr. Pricilla Riffle  earlier in November 2007.  At that time a Myoview scan was done in the  hospital.  The patient had an ejection fraction of 53% and no ischemia.   ALLERGIES:  No known drug allergies.   MEDICATIONS:  1. Currently in the hospital include Protonix.  2. Xopenex.  3. Morphine.  4. Vytorin.  5. Colchicine.  6.  Lopressor.  7. Lisinopril.  8. Plavix.  9. Lovenox.   This morning his Vytorin, colchicine and Lisinopril are held.   PAST MEDICAL PROBLEMS:  Please see the complete list below.   SOCIAL HISTORY:  The patient continues to smoke about one pack per week.  He  drinks beer and gin and he has had alcohol intake over time of significance.   FAMILY HISTORY:  Noncontributory at this time, as the patient already has  documented coronary artery disease.   REVIEW OF SYSTEMS:  His current review of systems is quite limited.  He is  having pain from his incision site from his lung surgery.  He is not having  his classic chest pain.  He is having pain, and yet somewhat lethargic at  the same time.  He is  not having any major GI symptoms.  The remainder of  his review of systems is quite limited because of his pain medications.   PHYSICAL EXAMINATION:  VITAL SIGNS:  Blood pressure 110/60, pulse rate 90.  GENERAL:  The patient is lethargic, but he is oriented to person, time and  place.  His affect is appropriate, despite his lethargy.  HEENT:  No xanthelasma.  He has normal extraocular motion.  NECK:  There are no carotid bruits.  There is no jugular venous distention.  HEART:  Reveals an S1 with an S2.  There are no clicks or significant  murmurs.  LUNGS:  Exam is difficult, as he has trouble moving, due to pain.  He has  some scattered rhonchi.  ABDOMEN:  Soft.  EXTREMITIES:  He has no significant peripheral edema.  MUSCULOSKELETAL:  There are no major musculoskeletal deformities.   LABORATORY DATA:  Labs today question a BUN of 16, with a creatinine of 3  and a potassium of 5.2 and sodium of 131.  The creatinine yesterday was 1.  It is being repeated stat.   A chest x-ray is also being done, to help assess his volume status, as he  has received a large amount of volume today.   His electrocardiogram shows no acute change.   PROBLEM:  1. Right upper lobe lung cavitary lesion with  surgery on April 29, 2006.  2. Coronary disease.  The patient is status post non-ST elevation      myocardial infarction in 2004, with a Taxus stent and a bare metal      stent.  The Myoview scan done earlier this month showed no ischemia and      an ejection fraction of 53%.  3. Gastroesophageal reflux disease.  4. Ongoing cigarette use.  5. Ongoing alcohol use.  6. Gout.  7. Lipids.  8. History of aspiration pneumonia in the past.  9. Status post lung surgery on April 29, 2006, as outlined.  10.Renal function.   RECOMMENDATIONS:  A BMET is to be repeated stat, to see if the creatinine is  real.  Hemoglobin of 9.  A chest x-ray is pending, and the patient has  received fluids.  I have no other formal recommendation concerning his  hemodynamics at this time.  The BMET is to be repeated stat.  His I&Os will  be followed, and there will be response concerning his fluids, based on his  urine output and his chest x-ray.  I will order cardiac enzymes, and we will  do a 2-D echocardiogram, to reassess his LV function, to be sure that there  has been no marked change.      Luis Abed, MD, [facility]  Electronically Signed     JDK/MEDQ  D:  04/30/2006  T:  04/30/2006  Job:  [id number]   cc:   Everardo Beals. Juanda Chance, MD, [facility]

## 2010-11-02 NOTE — Cardiovascular Report (Signed)
NAME:  [name], [name]                         ACCOUNT NO.:  [id number]   MEDICAL RECORD NO.:  [id number]                   PATIENT TYPE:  INP   LOCATION:  6532                                 FACILITY:  [facility]   PHYSICIAN:  Charlies Constable, M.D.                  DATE OF BIRTH:  09-03-1945   DATE OF PROCEDURE:  05/04/2003  DATE OF DISCHARGE:                              CARDIAC CATHETERIZATION   INDICATIONS FOR PROCEDURE:  [name] is 65 years old and was admitted to  the hospital with chest pain and enzymes consistent with a non-ST elevation  myocardial infarction.  He underwent a difficult intervention two days ago  of a tight lesion in the right coronary artery with a bare metal stent and  was brought back today for intervention on the circumflex artery.   PROCEDURE:  The procedure was performed via the left femoral artery using  arterial sheath and a CLS-4 6 [place] guiding catheter with side holes.  We  used a PT2 wire light support.  We crossed the lesion with the wire without  difficulty.  We predilated with a 3.0 x 20-mm Maverick performing one  inflation up to 6 atmospheres for 30 seconds.  We then passed a 3.0 x 24-mm  Taxus stent down to the lesion.  We had some difficulty advancing the stent  down to the lesion because of marked tortuosity in the proximal vessel.  However, we were able to accomplish this with less difficulty than  anticipated.  We deployed the stent with one inflation of  12 atmospheres  for 30 seconds.  We then post dilated with a 3.25 x 20-mm Quantum Maverick  performing one inflation up to 15 atmospheres for 30 seconds.  Repeat  diagnostic study was then performed through the guiding catheter.  The  patient tolerated the procedure well and left the laboratory in satisfactory  condition.   RESULTS:  Initially, the stenosis in the mid circumflex artery was estimated  at 80%.  Following stenting, this improved to less than 10%.   CONCLUSIONS:  Successful  stenting of the lesion in the mid circumflex artery  with a Taxus stent with improvement in stent renarrowing from 80% to less  than 10%.   DISPOSITION:  The patient returned to the postangioplasty unit for further  observation.   He will need Plavix for six months and aspirin indefinitely.                                               Charlies Constable, M.D.    BB/MEDQ  D:  05/04/2003  T:  05/04/2003  Job:  [id number]

## 2010-11-02 NOTE — Op Note (Signed)
NAME:  [name], [name]                         ACCOUNT NO.:  [id number]   MEDICAL RECORD NO.:  [id number]                   PATIENT TYPE:  OIB   LOCATION:  2899                                 FACILITY:  [facility]   PHYSICIAN:  Griffith Citron Mohorn, D.D.S.          DATE OF BIRTH:  Nov 25, 1945   DATE OF PROCEDURE:  10/06/2003  DATE OF DISCHARGE:  10/06/2003                                 OPERATIVE REPORT   PREOPERATIVE DIAGNOSIS:  Severe generalized periodontal disease.   POSTOPERATIVE DIAGNOSIS:  Severe generalized periodontal disease.   PROCEDURE:  Surgical extraction of teeth #2, 3, 4, 6, 7, 10, 11, 12, 13, 16,  17, 20, 27; two quadrants of alveoloplasty.   SURGEON:  Griffith Citron Mohorn, D.D.S.   ANESTHESIA:  General anesthesia.   INDICATIONS FOR PROCEDURE:  The patient was evaluated in our office and  found to have severe generalized periodontal disease.  He had been  experiencing moderate to severe pain periodically.  This was determined to  be due to the severe generalized periodontal disease, that all remaining  teeth would have to be removed and an alveoloplasty would have to be  performed in the upper right and left quadrants to facilitate for  application of a dental prosthesis.   DESCRIPTION OF PROCEDURE:  The patient was brought to the room, placed in a  supine position.  Once general anesthesia was induced by a nasotracheal  intubation, the patient was prepped and draped for maxillofacial procedure  of this type.  Initially 7 cc of mepivacaine, 3%, without epinephrine was  injected in all four quadrants.  A 15 blade was utilized to make an incision  around all the remaining teeth and a mucogingival flap was reflected  surrounding each tooth.  Teeth #2, 3, 4, 6, 7, 10, 11, 12, 13, 16, 20 and 27  were all removed surgically without complications.  Tooth #7 was partially  impacted and required bone removal and sectioning of the tooth.  It also was  removed without  complications.  An alveoloplasty was performed in the  maxillary arch.  All sharp, bony projections were removed.  The bridge was  palpated with overlying gingival tissue and no sharp, bony projections were  noted.  Sites were irrigated with normal saline and suctioned dry.  Avidene  was placed in each site to facilitate hemostasis.  The soft tissue was then  closed utilizing a 4-0 chromic suture in a running fashion.  No significant,  active bleeding was noted upon completion of the procedures.  The throat  pack which had been placed preoperatively was removed and the oral cavity  was suctioned again.  Gauze was placed over each site to facilitate  hemostasis as well.   This concluded the procedure, and the patient was allowed to awaken and  extubated without complications.  Griffith Citron Mohorn, D.D.S.    Gardenia Phlegm  D:  10/06/2003  T:  10/06/2003  Job:  [id number]
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
## 2010-11-02 NOTE — Op Note (Signed)
[name], [name]               ACCOUNT NO.:  [id number]   MEDICAL RECORD NO.:  [id number]            PATIENT TYPE:   LOCATION:                                 FACILITY:   PHYSICIAN:  Salvatore Decent. Dorris Fetch, M.D. DATE OF BIRTH:   DATE OF PROCEDURE:  05/02/2006  DATE OF DISCHARGE:                                 OPERATIVE REPORT   PREOPERATIVE DIAGNOSIS:  Right hemothorax status post video-assisted  thoracic lobectomy.   POSTOPERATIVE DIAGNOSIS:  Right hemothorax status post video-assisted  thoracic lobectomy.   PROCEDURE:  Reexploration of right chest, evacuation of hemothorax.   SURGEON:  Salvatore Decent. Dorris Fetch, M.D.   ASSISTANT:  Coral Ceo, P.A.   ANESTHESIA:  General.   FINDINGS:  Large hemothorax no specific bleeding sites identified.   HISTORY OF PRESENT ILLNESS:  Mr. Sanzone is a 65 year old gentleman who had  undergone a right VATS lobectomy earlier in the week.  He had developed a  large hemothorax likely due to the combination of aspirin, Plavix, and  Lovenox.  There also was concern that he may have inadvertently been taking  Plavix prior to his initial operation.  The patient was suffering from  postoperative delirium, but it was discussed with his family that he should  undergo re-exploration for evacuation of the hemothorax, to prevent  fibrothorax  and trapped lung.  Indications, risks, benefits, and  alternatives were discussed in detail with the patient and his family.  They  understood and gave consent.   OPERATIVE NOTE:  Mr. Hillery was brought directly to the operating room on  05/02/2006.  There he was given intravenous vancomycin.  He was anesthetized  and intubated with a double-lumen endotracheal tube.  A central venous  catheter was placed; a Foley catheter was placed; and an arterial blood  pressure monitoring catheter was placed.  He was placed in a left lateral  decubitus position; and the right chest was prepped and draped in the usual  fashion after removing the indwelling chest tubes.  PAS hose were in place  for DVT prophylaxis.   A port was inserted through one of the chest tube sites; and the  thoracoscope was inserted into the chest.  A single lung ventilation of the  left lung was carried out.  The small anterior utility thoracotomy incision  was opened.  There was a large hemothorax with a large component clotted,  primarily at the apex, but also tracking along the posterior gutter.  The  clot was removed using sponge forceps, repeated irrigation, and suction.  Cultures were sent, but the hemothorax did not appear to be infected.  The  prior incision sites, the vascular and bronchial staple lines; and in the  pulmonary staple lines were inspected with no bleeding sites noted.  There  was also no bleeding noted from the lymph node dissection area in the  peritracheal region either.  In fact, no active bleeding was occurring this  time of exploration.  After completely evacuating the hemothorax, the chest  was copiously irrigated with warm saline solution.  Then 36-French chest  tubes were placed  anteriorly and posteriorly and secured with #1 silk  sutures.  The  lung was inflated with good aeration of both the middle and the lower lobes.  The anterior utility thoracotomy then was closed in 3 layers. The chest  tubes were placed to suction.  The patient was reintubated with a single-  lumen endotracheal tube; and taken to the surgical intensive care unit,  intubated, and in stable condition.           ______________________________  Salvatore Decent Dorris Fetch, M.D.     SCH/MEDQ  D:  05/02/2006  T:  05/03/2006  Job:  [id number]

## 2010-11-02 NOTE — Consult Note (Signed)
NAME:  [name], [name]               ACCOUNT NO.:  [id number]   MEDICAL RECORD NO.:  [id number]          PATIENT TYPE:  INP   LOCATION:  2314                         FACILITY:  [facility]   PHYSICIAN:  Sigmund I. Patsi Sears, M.D.DATE OF BIRTH:  Mar 03, 1946   DATE OF CONSULTATION:  05/23/2006  DATE OF DISCHARGE:                                 CONSULTATION   SUBJECTIVE:  Gross hematuria in a 65 year old ventilator-dependent male,  originally admitted April 21, 2006, with chest pain and found to have:  1. 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  2. Lobectomy for cancer, with postop ATN, with creatinine 1 to 3, to      2.2 to 1, (resolved) on May 03, 2006.  3. Postop right hemothorax - drained - with recurrent ATN (creatinine      6.3).  4. ARDS with dialysis.  Now partially resolved with creatinine today      of 2.1.  5. Cardiac arrest x5.  Currently ventilator dependent.  6. Gross hematuria times greater than 1 week.  The patient has been on      Plavix for protection for cardiac drug-eluding stent (placed      greater than 6 months ago), now stopped x2 days because of severe      gross hematuria with hemoglobin loss, requiring transfusion.   PAST MEDICAL HISTORY:  1. Significant for ethanolism.  2. MI 2004.  3. GERD.  4. Tobacco abuse (40-pack-year history).  5. Gout.  6. Methicillin-resistant Staphylococcus aureus.  7. Congestive heart failure.  8. Inguinal hernia repair.   CURRENT PHYSICAL EXAMINATION:  GENERAL:  Shows ventilator-dependent  male.  VITAL SIGNS:  The blood pressure tonight is 90/42, pulse is 88.   The patient is on ventilator with 100% saturation (the Foley catheter  with 200 mL drainage this shift with output totals of +680 mL the last  shift).  Hemoglobin at 0400 was 8.2 with creatinine 2.1.  Repeat  hemoglobin is pending tonight, prior to transfusion.   ASSESSMENT:  Gross hematuria causing drop in hemoglobin from unknown  source.  The patient is being evaluated nephrology, among other  consultants.  The patient has no history of stones, no history of  bladder or renal tumor.  Renal ultrasound showed no hydronephrosis, no  masses.  The patient will need cystoscopy, but it will difficult to see  because of the dense gross hematuria.  Because of ventilator dependency,  however, we will plan bedside flexible cysto in the morning.  Also, we  must consider the use of Amicar, but concerns are for cardiac side  effects.  Will ask cardiology and renal to comment on possible use.   PLAN:  1. Flexible cysto tomorrow.  2. Recheck hemoglobin pre- and post-transfusion.  3. Consider Amicar use.      Sigmund I. Patsi Sears, M.D.  Electronically Signed     SIT/MEDQ  D:  05/23/2006  T:  05/24/2006  Job:  [id number]   cc:   Salvatore Decent. Dorris Fetch, M.D.

## 2010-11-02 NOTE — Consult Note (Signed)
NAMEMarland Kitchen  CONO, GEBHARD NO.:  [id number]   MEDICAL RECORD NO.:  [id number]          PATIENT TYPE:  INP   LOCATION:  5507                         FACILITY:  [facility]   PHYSICIAN:  [name] Riffle, MD, FACCDATE OF BIRTH:  [birth year]   DATE OF CONSULTATION:  04/21/2006  DATE OF DISCHARGE:                                   CONSULTATION   IDENTIFICATION:  [name] is a 65 year old gentleman whom we were  asked to see regarding chest discomfort.   HISTORY OF PRESENT ILLNESS:  The patient has known coronary artery disease,  status post MI back in November 2004.  At that time he had a 80% mid-left  circumflex, a 50% distal left circumflex and a 90% proximal RCA.  The  patient underwent a bare metal stenting to the RCA.  A left circumflex was  done following this, with a drug-eluting stent to the circumflex.  The  patient has been followed by Dr. Everardo [name]. [name].  He was last seen in the  spring, by his report.  He has a Myoview scan done, that was done in the  spring.   The patient notes he had some shunt pressure in the spring.  He reports it  as an indigestion sensation.  He was seen by Dr. Iva [name]. Omeprazole  was added and he felt better.  In August he was seen by Dr. Chauncey [name],  his primary doctor and felt a little swimmy- headed.  No chest pain.  His  blood pressure meds were decreased.   Friday night he noted that he had been drinking some and then he felt bad.  He could not sleep and got chest heaviness, like prior to his MI.  He  presented to the emergency room.  Since admission he has not felt this  discomfort.   On talking with the patient, he has chest pressure with walking.  Since his  heart attack in 2004, it has  been chronic.  Has been slowing some.  Attributes this to age.   Note, since his admission he has been found to have a right upper lobe  cavitary lung lesion and has been seen by CVTS with plans for surgery.  He  is due to  have a PET CT done later this week.  He denies cough, no fever.   ALLERGIES:  None.   MEDICATIONS:  1. Thiamine.  2. Folic acid.  3. Plavix 75 q. day.  4. Atenolol. q. daily.  5. Lisinopril 10 mg q. day.  6. Avelox 400 mg.   PAST MEDICAL HISTORY:  1. Coronary artery disease.  2. Congestive heart failure ?.  3,  Dyslipidemia.  1. Gout.  2. Hypertension.  3. GE reflux.  EGD in the past was normal.  4. Hypokalemia.   SOCIAL HISTORY:  The patient continues to smoke about a pack per week had a  40-pack-year.  Drinks beer and gin.   FAMILY HISTORY:  Mother died at age 9.  Father is unknown. Two brothers,  uncertain health.  Two sisters, one with coronary disease.  REVIEW OF SYSTEMS:  All systems reviewed and negative for the above problem,  except as noted.   PHYSICAL EXAMINATION:  The patient is in no acute distress.  Denies chest  pain.  VITAL SIGNS:  Blood pressure 141/82, pulse is 62 and regular, temperature is  98.3, O2 sat on room air is 94%.  HEENT:  Normocephalic, atraumatic.  PERRL.  Old scar on face.  NECK:  No bruits.  No JVD.  LUNGS:  Decreased breath sounds in the left base.  HEART:  Cardiac exam reveals a regular rate and rhythm S1-S2.  No S3, no  murmurs.  ABDOMEN:  No hepatosplenomegaly, supple.  EXTREMITIES:  One to two plus posterior tibial. No edema.   CHEST X-RAY:  CT shows a 5.2 cm thick-walled cavitary lesion in the right  upper lobe.   EKG is not done.   LABORATORY DATA:  The labs significant for a hemoglobin of 13.4, WBC of 5.8.  BUN and creatinine of 10 and /1, potassium of 3.7, CK-MB of 303/4.4,  troponin 0.03.  Blood alcohol level on admission 275. ABG on admission:  O2  sat 91%.   IMPRESSION:  78. A 65 year old with known coronary artery disease,  status post stent to      the right coronary artery, non-drug eluting and drug eluting to the      circumflex in November 2004.  2. Chronic stable exertional angina, seen in the clinic in May  2007, by      Dr. Charlies [name].  May have had a Myoview scan, but computer down and      unable to retrieve.  3. Some chest discomfort, treated by Dr. Elenore [name].  Was reflux, was      indigestion.  Friday, after drinking some, had chest pressure similar      to a myocardial infarction, not feeling well. He came to the emergency      room. Has not had any since.  Troponin negative x2, close.  No      electrocardiograms.  Examination relatively unremarkable.   The patient is a difficult historian regarding pain.  Question  gastrointestinal, question cardiac.  This pain in the spring was different  than on Friday.  In the spring it was indigestion and Friday was like in  November 2004, with a myocardial infarction.   RECOMMENDATIONS:  An adenosine Myoview to define the patient's risks  further, given the stents.  Get an electrocardiogram.  Will get old records.  Okay to discontinue Plavix now.  Will need to schedule with radiology, so he  does not have scans on the same day as the PET.  Will continue to follow.           ______________________________  [name] Riffle, MD, [facility]    PVR/MEDQ  D:  04/21/2006  T:  04/22/2006  Job:  [id number]

## 2010-11-02 NOTE — Discharge Summary (Signed)
[name], [name]               ACCOUNT NO.:  [id number]   MEDICAL RECORD NO.:  [id number]           PATIENT TYPE:   LOCATION:                               FACILITY:  [facility]   PHYSICIAN:  Salvatore Decent. Dorris Fetch, M.D.DATE OF BIRTH:  1946-05-08   DATE OF ADMISSION:  04/28/2006  DATE OF DISCHARGE:                               DISCHARGE SUMMARY   CARDIOLOGIST:  Luis Abed, M.D.   UROLOGISTLynelle Smoke I. Patsi Sears, M.D.   NEPHROLOGIST:  Wilber Bihari. Caryn Section, M.D.   PULMONOLOGIST:  Charlcie Cradle. Delford Field, M.D.   ADMISSION DIAGNOSIS:  Right upper lobe cavity mass.   HOSPITAL DIAGNOSES:  1. Stage IB squamous cell carcinoma.  2. Non-small-cell lung cancer.  3. Right hemothorax, status post evacuation.  4. Acute renal failure, baseline creatinine of 1.  5. Acute respiratory distress syndrome/ventilation-dependent      respiratory failure.  6. Methicillin-resistant Staphylococcus aureus on blood cultures on      May 03, 2006.  7. Oliguria.  8. Acute blood loss anemia, status post multiple blood transfusions.  9. Protein malnutrition.  10.Klebsiella pneumonia.   SECONDARY DIAGNOSES:  1. Alcoholism/delirium tremens.  2. Coronary artery disease, status post myocardial infarction in 2004.      Percutaneous coronary intervention x2, ejection fraction 35%.  3. Gastroesophageal reflux disease.  4. Tobacco abuse.  A history of 40-pack years.  5. Gout.  6. Hyperlipidemia.  7. Congestive heart failure.  8. History of inguinal hernia repair.   ALLERGIES:  No known drug allergies.   CONSULTATIONS:  1. On April 30, 2006, Dr. Luis Abed was consulted for      cardiology.  2. On May 07, 2006, Dr. Wilber Bihari. Caryn Section was consulted for      nephrology.  3. On May 03, 2006, pulmonary and critical care management was      consulted.  4. On May 23, 2006, Dr. Lynelle Smoke I. Patsi Sears was consulted for      urology.  5. On June 05, 2006, Dr. Rachael Fee was consulted  for      gastroenterology.   OPERATIONS:  1. On April 28, 2006, the patient underwent a bronchoscopy, right      video-assisted thoracoscopic surgery, wedge resection, right upper      lobectomy and mediastinal lymph node dissection by Dr. Viviann Spare C.      Hendrickson.  2. On May 02, 2006, the patient underwent a re-exploration of the      right chest, evacuation of hemothorax by Dr. Dorris Fetch.  3. On May 24, 2006, the patient underwent a flexible cysto-      urethroscopy by Dr. Patsi Sears.  4. On May 28, 2006, the patient underwent a tracheostomy with a      #8 Shiley tracheostomy tube by Dr. Dorris Fetch.  5. On June 06, 2006, the patient underwent a PEG tube placement by      Dr. Christella Hartigan.   HISTORY OF PRESENT ILLNESS:  This is a 65 year old gentleman who was  recently admitted with atypical chest pains on April 21, 2006.  A  cardiac workup was negative;  however, during his workup he had a chest x-  ray which showed a cavitary mass in his right upper lobe.  A CT scan  confirmed this right upper lobe mass and possible satellite nodules.  It  was 5.3 cm in diameter.  The PET scan showed marked hypermetabolic  activity in the mass, consistent with primary lung cancer.  There was no  mediastinal or hilar hypermetabolic uptake and no evidence of distant  metastases.  [name] was advised to undergo a right bronchoscopy,  followed by a right video-assisted thoracoscopic surgery with a wedge  resection of the lesion.  The risks and benefits were explained to the  patient in great detail, and he did agree to proceed.  Please see the  dictated history and physical by Dr. Dorris Fetch for further details.   HOSPITAL COURSE:  The patient has had a long and extensive hospital  course with multiple diagnoses.  The patient underwent a right upper  lobectomy on April 28, 2006, without any complications.  The patient  was doing well postoperatively; however, he did have  some nausea and  this was treated appropriately.  On postoperative day number one, the  patient remained hemodynamically stable and his creatinine was stable.  The patient was restarted on Plavix for his history of stents and  Lovenox for deep venous thrombosis prophylaxis.  On postoperative day  number two the patient's chest tube had ongoing blood drainage.  The  patient's Plavix and Lovenox was then held and he was given sequentials  for deep venous thrombosis prophylaxis.  The patient's chest x-ray on  May 01, 2006, did show a right chest opacity, which was likely to  be a hemothorax.  A CT scan without contrast was obtained, which was  positive for a hemothorax.  The patient went to the operating room on  May 02, 2006, for an exploration of the right chest and evacuation  of his hemothorax by Dr. Dorris Fetch.  This was done without any  complications.  The patient was then admitted to the SICU on the vent.   Pulmonary tried to wean the patient from his vent on May 03, 2006,  and also do pressure support wean; however, this was not obtainable  secondary to agitation.  The patient finally was able to be extubated on  May 18, 2006, and he was started on the BiPAP machine.  The patient  did have severe respiratory alkalosis.  On May 23, 2006, the patient  was found to have a mucus plug and underwent a bronchoscopy by critical  care management.  During his bronchoscopy, the patient became  bradycardic and hypotensive.  His pulse was lost.  CPR was immediately  started.  The patient was noted to have a large mucus plug, which was  suctioned out by CCM.  The patient was then re-intubated.  The patient's  pulse came back with atropine and epinephrine.  Again, the patient's  vent was unable to be weaned due to agitation of the patient.  A trach  was then placed on May 28, 2006, by Dr. Dorris Fetch.  The patient has remained on a trach since then.  He is currently  100% O2 on 28%  trach.  The patient is receiving trach care daily.   The patient did have some postoperative confusion.  He did have  metabolic acidosis.  He has a history of alcohol abuse, and he was  started on low-dose benzodiazepines.  The patient is receiving thiamine  and folic  acid daily for his history of alcohol abuse.   Postoperatively the patient did have acute renal failure and oliguria.  His creatinine on April 20, 2006, was increased to 3.  Previously it  was 1.  The patient's volume was replaced with saline.  His Lisinopril  was held.  The patient's creatinine did decrease to 2.2 on May 01, 2006.  His creatinine and BUN had resolved on May 02, 2006;  however, on May 06, 2006, the patient's creatinine increased again  to 3.  It was 0.8 on May 05, 2006.  The patient was given IV  fluids.  Due to the patient's decrease in output and his increase in  creatinine, renal was consulted.  It was thought that his creatinine was  decreased, secondary to hypoperfusion.  The patient was given Lasix.  The patient's creatinine jumped to 5.8 on May 09, 2006, and 6.1 on  May 10, 2006.  The patient was making good urine with high-dose  Lasix.  Due to the patient's renal function not resolving, he did have  to go on hemodialysis on May 13, 2006, and May 14, 2006, and  May 16, 2006.  The patient did receive Aranesp and hemodialysis on  May 17, 2006.  His creatinine was decreasing while on hemodialysis  to 3.2 on May 17, 2006, and 2.6 on May 19, 2006.  On May 21, 2006, the patient was found to have gross hematuria.  Hemoglobin and  hematocrit did decrease on May 22, 2006, to 7.8, and he was  transfused 2 units of packed red blood cells.  His creatinine has slowly  been improving.  He did undergo a cystoscopy on May 24, 2006, by Dr.  Patsi Sears, who irrigated a clot.  On June 03, 2006, the patient's  hematuria has  resolved.  Currently, the patient's creatinine is at  baseline at 1.3.  The patient is currently making good urine.   The patient was febrile on May 04, 2006, and he was started on  vancomycin and Maxipime.  The patient's central line was discontinued  due to no found source of infection.  He did have positive blood  cultures on May 03, 2006.  The patient's Maxipime was discontinued  on May 09, 2006.  He completed 10 days.  The patient was on  vancomycin for a total of 14 days.  The patient did have diarrhea on  May 06, 2006, and the patient was started on Flagyl  prophylactically and his C. difficile toxin was sent.  The patient's C.  difficile was negative on May 12, 2006, and his Flagyl was  discontinued.   On May 29, 2006, the patient was found to have positive sputum for  Klebsiella pneumonia and he was again started on Maxipime for a total of 14 days.  The patient was restarted on vancomycin and he only continued  this for four more days, and then this was discontinued.  The patient's  fevers did resolve shortly after May 29, 2006, when he was started  on antibiotics.   The patient did have acute blood loss anemia postoperatively and he was  transfused 3 units of packed red blood cells on May 02, 2006.  The  patient's hemoglobin and hematocrit did remain stable between 8 and 9;  however, on May 11, 2006, the patient's hemoglobin dropped to 8 and  he was transfused again 2 units of packed red blood cells, with  improvement.  The patient was started on iron sulfate.  On May 21, 2006, the patient's hemoglobin dropped to 7.8, and he was transfused 2  units of packed red blood cells.  On May 24, 2006, the patient's  hemoglobin was still at 7.8, and he received 2 units of packed red blood  cells.  The patient's hemoglobin and hematocrit deficit did resolve  after all of these transfusions and urology saw the patient.  His   hemoglobin and hematocrit are currently 10.6 and 32.5.   A Panda tube was placed on the patient on April 28, 2006, for tube  feeds.  The patient was started on __________ at 65 mL an hour.  This  was shortly changed to Nephro-Vite due to the patient's protein  malnutrition at 45 mL an hour.  Once the patient had to go on  hemodialysis, he needed an increase in protein, and the tube feed was  increased to 55 mL an hour.  This was discontinued on May 31, 2006,  and he was changed to Jevity 1.2 with a goal of 75 mL an hour.  The  patient is receiving free water at 200 mL every six hours.   The patient started physical therapy and occupational therapy on  June 02, 2006.  Recently they wrote a note in the chart that due to  the patient's high degree of agitation, it is very hard to get the  patient to  participate in his therapy.   From a cardiac standpoint, the patient has remained stable.  Cardiology  did follow the patient postoperatively.  Postoperatively the patient was  hypotensive and his blood pressure medications were held; however, at  times his blood pressure has risen, and he was given labetalol IV.  Cardiology did aid in the patient's blood pressure care, and currently  his blood pressure has remained stable in the 120's to 130's/50's-70's.  The patient has maintained a normal sinus rhythm.   The patient has remained very restless and agitated.  It is possible we  will adjust his medications, due to that this may be limiting his  progress currently.  We will follow up with the M.D. at a later time.  The patient's chest x-rays currently have been stable.  On May 04, 2006, the patient's pathology from his right upper lobectomy showed  T2N0, stage IB non-small-cell lung cancer, squamous cell lung cancer.   DISPOSITION:  The patient will be discharged to a long-term nursing  facility, provided his insurance is accepted and a bed is available.  DISCHARGE  INSTRUCTIONS:  1. The patient is to follow occupational therapy and physical therapy      for reconditioning.  He may get out of bed to the chair when he is      not extremely agitated and it is safe for the patient.  2. He is to continue his tube feeds Jevity 1.2 at 75 mL an hour and      free water 200 mL q.6h.  This is through the patient's PEG tube.  3. The patient may sponge bathe.  4. Call the office with any problems.   DISCHARGE MEDICATIONS:  1. The patient will have tube feeds with Jevity 1.2, at 75 mL an hour.  2. Sodium chloride 0.9%, 10 mL q.12h. for flushing the central line.  3. Thiamine 100 mg through the tube daily.  4. Folic acid 1 mg through the tube daily.  5. Ferrous sulfate 300 mg through the tube daily.  6. Norvasc 10 mg through the tube daily.  7. Protonix 40 mg through the tube daily with meals.  8. Aranesp 200 mcg subcu every Thursday.  9. Lamisil 1% cream, apply to the skin topically b.i.d. to the      patient's feet.  10.Potassium chloride 40 mEq through the tube daily.  11.Fentanyl 25 mcg/hour patch.  Remove the old patch before applying a      new dose, and apply this q.72h.  12.Bidil 20/37.5 mg tab through the tube b.i.d.  13.Labetalol 100 mg through the tube q.8h.  14.Seroquel 75 mg p.o. q.12h.  15.Klonopin 1 mg p.o. b.i.d.  16.Lasix 40 mg through the tube daily.  17.Free water 200 mL through the tube q.6h.  18.Jevity 1.2 calories nutritional supplement, dose 1000 mL through      the tube q.13h. at a rate of 75 mL an hour.  19.Ultram 50 mg to 100 mg through the tube q.6h. p.r.n.  20.Senokot-S, one tab through the tube p.r.n.  21.Tylenol 650 mg through the tube q.4h. p.r.n.  22.Labetalol 40 mg IV q.4h. p.r.n. for systolic blood pressure greater      than 170, heart rate greater than 60.  23.Hydralazine 20 mg IV q.4h. p.r.n. systolic blood pressure greater      than 160, heart rate less than 60.  24.Lomotil two tab through the tube q.6h. p.r.n.   25.Morphine 2 mg to 6 mg IV q.1h. p.r.n.  26.Haldol 2 mg to 5 mg IV q.1h. p.r.n.  27.Xopenex 0.63 mL inhalation q.6h. p.r.n.   FOLLOWUP:  1. The patient will follow up with Dr. Dorris Fetch on June 30, 2006, at 1 p.m.  2. Prior to seeing Dr. Dorris Fetch, he will have a chest x-ray taken      at [facility] at 12 p.m. on June 30, 2006.      Constance Holster, PA    ______________________________  Salvatore Decent Dorris Fetch, M.D.    JMW/MEDQ  D:  06/11/2006  T:  06/11/2006  Job:  [id number]   cc:   Luis Abed, MD, [facility]  Sigmund I. Patsi Sears, M.D.  Richard F. Caryn Section, M.D.  Rachael Fee, MD  Charlcie Cradle. Delford Field, MD, FCCP

## 2010-11-30 ENCOUNTER — Telehealth: Payer: Self-pay | Admitting: *Deleted

## 2010-11-30 ENCOUNTER — Other Ambulatory Visit: Payer: Self-pay | Admitting: *Deleted

## 2010-12-02 MED ORDER — HYDROCODONE-ACETAMINOPHEN 7.5-500 MG PO TABS: 1.0000 | ORAL_TABLET | Freq: Four times a day (QID) | ORAL | Status: DC | PRN

## 2010-12-03 NOTE — Telephone Encounter (Signed)
Called to pharm 

## 2010-12-10 ENCOUNTER — Other Ambulatory Visit: Payer: Self-pay | Admitting: *Deleted

## 2010-12-11 MED ORDER — METOPROLOL TARTRATE 50 MG PO TABS: 25.0000 mg | ORAL_TABLET | Freq: Two times a day (BID) | ORAL | Status: DC

## 2010-12-18 IMAGING — CT CT CHEST W/ CM
2 of 3 series · 15 of 36 positions shown, 18 images · IV contrast (agent unspecified)
Comparison: Plain film 04/08/2008.  CT of 01/26/2008

CLINICAL DATA: Lung cancer status post right lung resection.
Shortness of breath.

CT CHEST WITH CONTRAST
TECHNIQUE: Multidetector CT imaging of the chest was performed
following the standard protocol during bolus administration of
intravenous contrast.
Contrast: 80 ml Umnipaque-ZTT

[Series 2: chest routine 5.0 b40f · axial · 0.72mm/px · z∈[-242,+38]mm · 12 of 66 slices shown, 15 images]
[im 5/66  mediastinal]
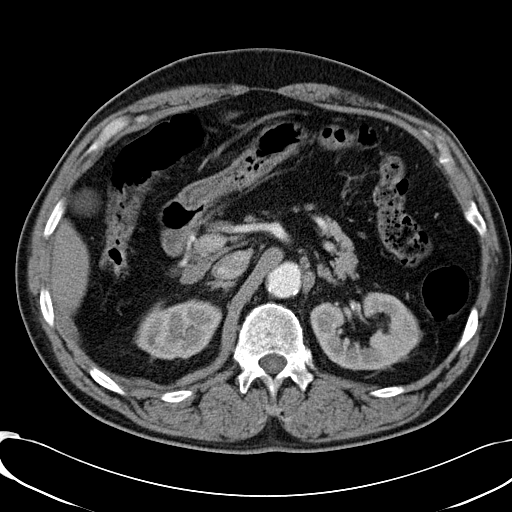
[im 5/66  lung]
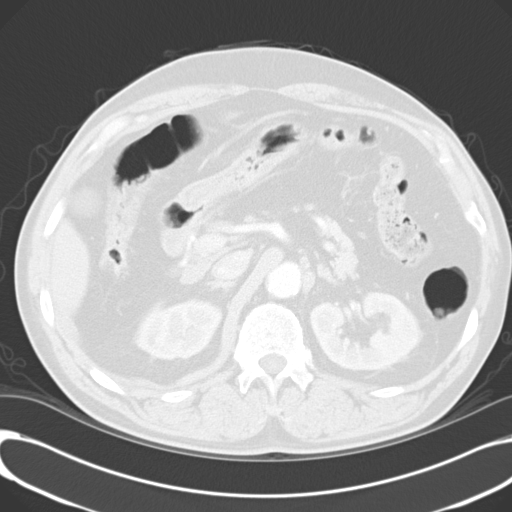
[im 10/66  lung]
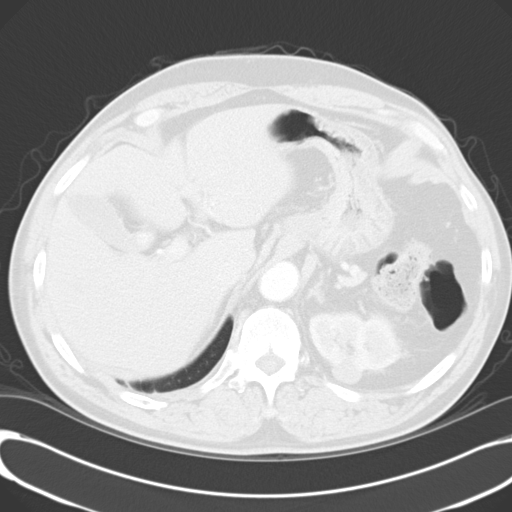
[im 15/66  lung]
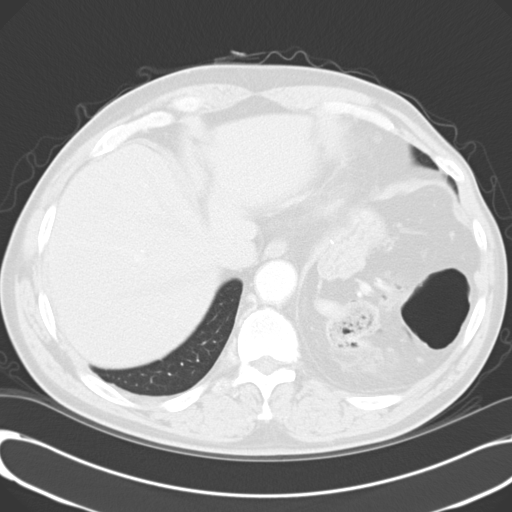
[im 20/66  lung]
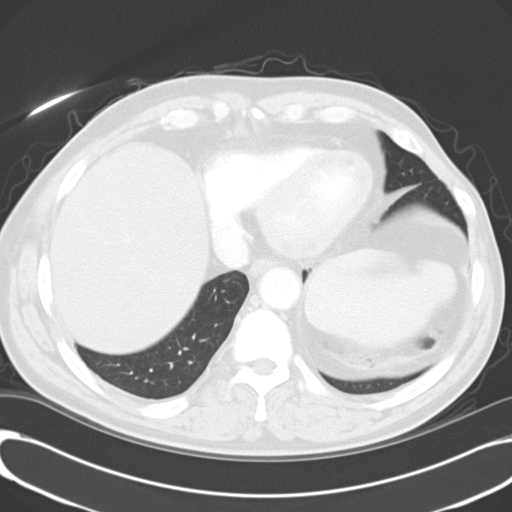
[im 25/66  mediastinal]
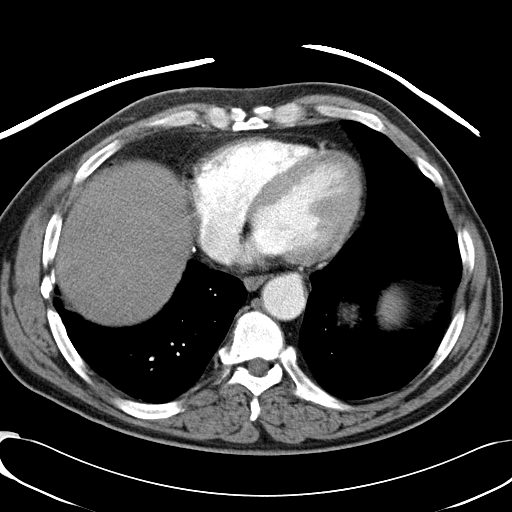
[im 25/66  lung]
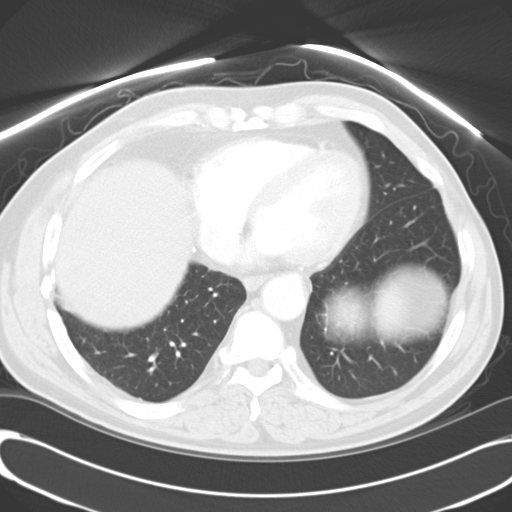
[im 29/66  lung]
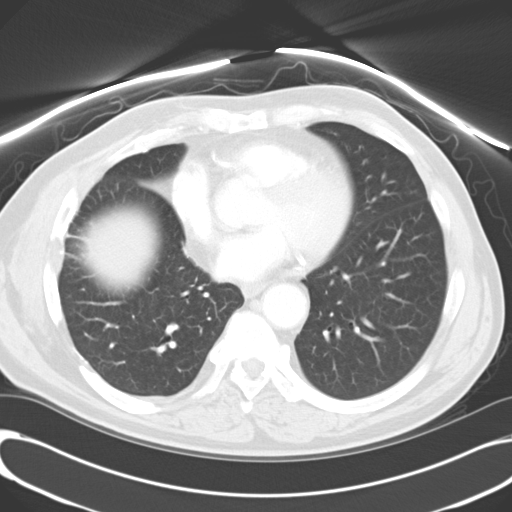
[im 37/66  lung]
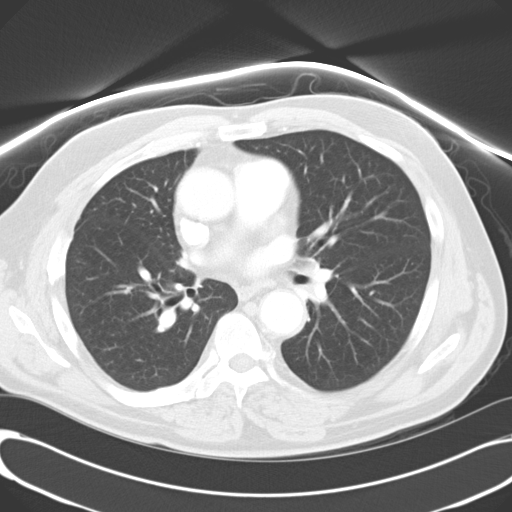
[im 41/66  lung]
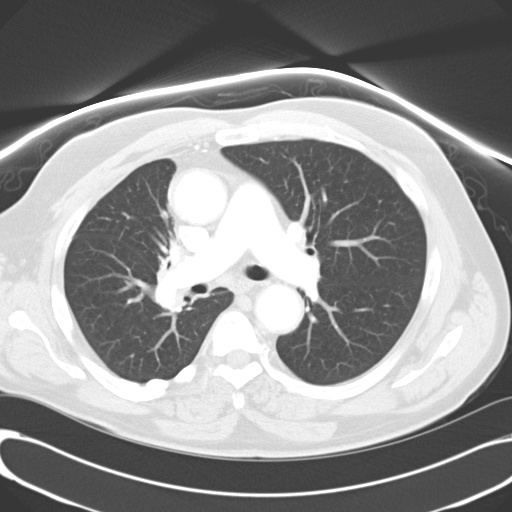
[im 46/66  mediastinal]
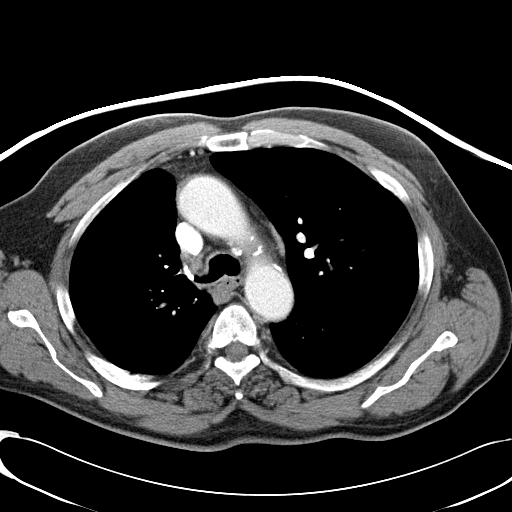
[im 46/66  lung]
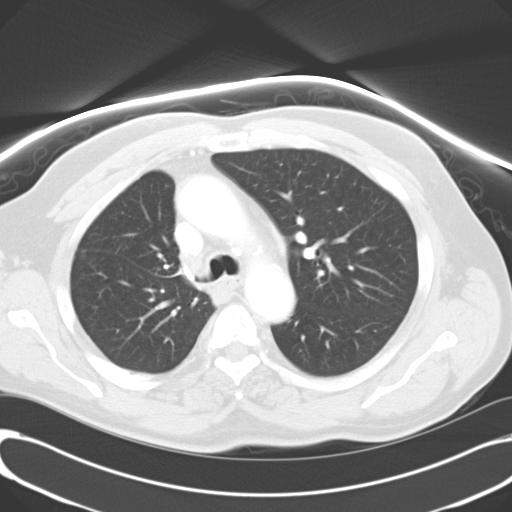
[im 51/66  lung]
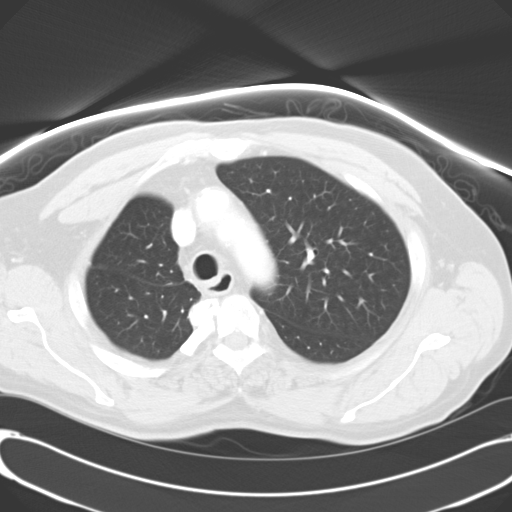
[im 56/66  lung]
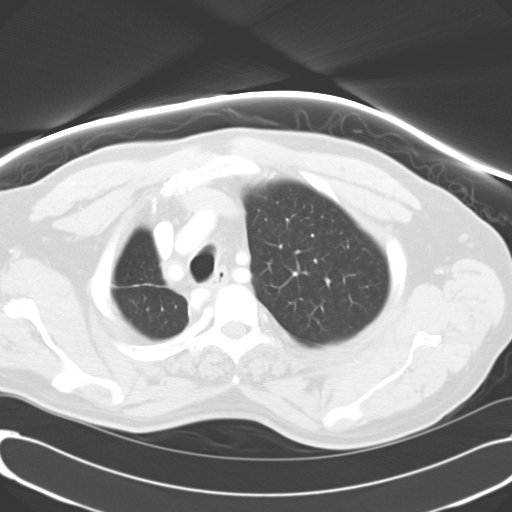
[im 61/66  lung]
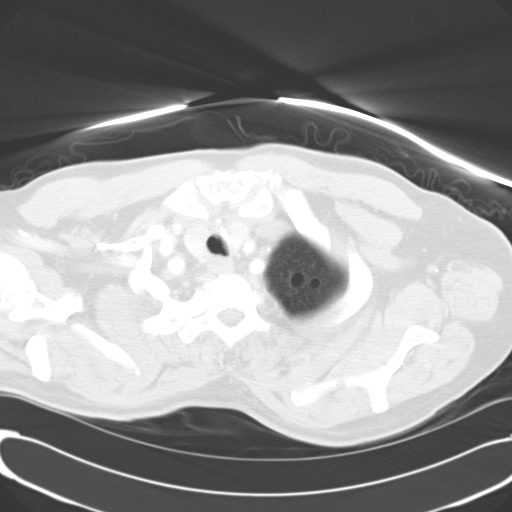

[Series 602: <mpr thick range> · coronal · 0.72mm/px · 3 of 77 slices shown]
[im 16/77  lung]
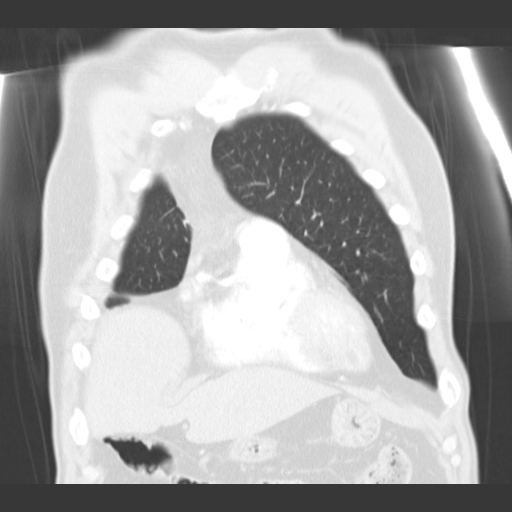
[im 31/77  lung]
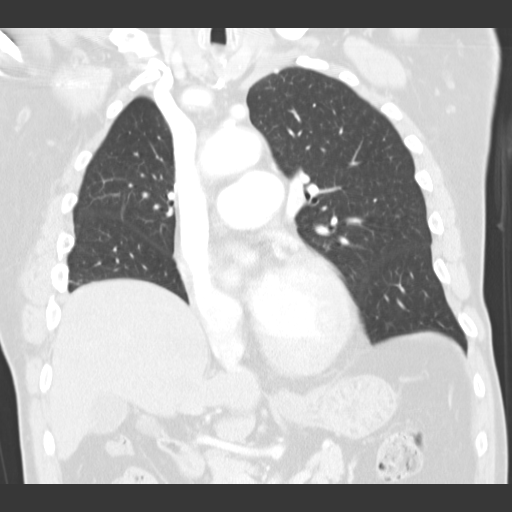
[im 46/77  lung]
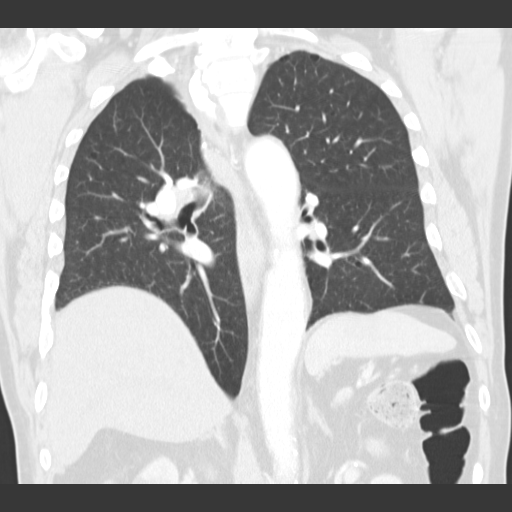

[15 of 36 positions shown; findings below may reference images not displayed]

FINDINGS: Lung windows demonstrate mild motion artifact superiorly.
Right upper lobectomy.  Mild volume loss in the right hemithorax.
Mild centrilobular emphysema.

Soft tissue windows demonstrate advanced atherosclerosis of the
origin of the right subclavian artery without significant stenosis.
Mild cardiomegaly. No pericardial or pleural effusion.  Multivessel
coronary artery atherosclerosis is age advanced. No mediastinal or
hilar adenopathy.

Limited abdominal imaging demonstrates gallstones.  Vague area of
hyperenhancement in the hepatic dome is less conspicuous on the
10/06/2007 exam and likely a perfusion anomaly.  Normal adrenal
glands.  A left renal lesion measures 2.0 cm and is similar to on
the prior exam.  This remains greater than fluid density and is
therefore indeterminate.  Surgical defects in the right sided ribs.
IMPRESSION: 1. No acute process or evidence of metastatic disease in the chest.
Postsurgical changes of right upper lobectomy.
2.  Similar appearance of indeterminate left renal lesion.
Recommend attention on follow-up.
3.  Age advanced coronary artery atherosclerosis.

REF:G5 DICTATED: 07/28/2008 [DATE]

## 2010-12-24 ENCOUNTER — Other Ambulatory Visit: Payer: Self-pay | Admitting: *Deleted

## 2010-12-24 MED ORDER — FUROSEMIDE 20 MG PO TABS: 20.0000 mg | ORAL_TABLET | Freq: Every day | ORAL | Status: DC

## 2010-12-24 NOTE — Telephone Encounter (Signed)
Already requested

## 2011-01-01 ENCOUNTER — Other Ambulatory Visit: Payer: Self-pay | Admitting: *Deleted

## 2011-01-01 MED ORDER — HYDROCODONE-ACETAMINOPHEN 7.5-500 MG PO TABS: 1.0000 | ORAL_TABLET | Freq: Four times a day (QID) | ORAL | Status: DC | PRN

## 2011-01-01 NOTE — Telephone Encounter (Signed)
Send refill to [place].  Ask him to arrange contract and complete FYI for opiate use.

## 2011-01-01 NOTE — Telephone Encounter (Signed)
Dr Gilford Rile is out of town this month on special rotation

## 2011-01-02 NOTE — Telephone Encounter (Signed)
Meds called in

## 2011-01-25 ENCOUNTER — Ambulatory Visit (HOSPITAL_COMMUNITY)
Admission: RE | Admit: 2011-01-25 | Discharge: 2011-01-25 | Disposition: A | Discharge status: Home / Self Care | Payer: PRIVATE HEALTH INSURANCE | Source: Ambulatory Visit | Attending: Internal Medicine | Admitting: Internal Medicine

## 2011-01-25 ENCOUNTER — Other Ambulatory Visit: Payer: Self-pay | Admitting: Internal Medicine

## 2011-01-25 ENCOUNTER — Encounter: Payer: PRIVATE HEALTH INSURANCE | Admitting: Internal Medicine

## 2011-01-25 ENCOUNTER — Encounter (HOSPITAL_COMMUNITY): Payer: Self-pay

## 2011-01-25 DIAGNOSIS — C3490 Malignant neoplasm of unspecified part of unspecified bronchus or lung: Secondary | ICD-10-CM | POA: Insufficient documentation

## 2011-01-25 DIAGNOSIS — K8020 Calculus of gallbladder without cholecystitis without obstruction: Secondary | ICD-10-CM | POA: Insufficient documentation

## 2011-01-25 DIAGNOSIS — N281 Cyst of kidney, acquired: Secondary | ICD-10-CM | POA: Insufficient documentation

## 2011-01-25 LAB — CBC WITH DIFFERENTIAL/PLATELET
BASO%: 1.6 % (ref 0.0–2.0)
MCHC: 33.2 g/dL (ref 32.0–36.0)
MONO#: 0.4 10*3/uL (ref 0.1–0.9)
RBC: 4.59 10*6/uL (ref 4.20–5.82)
RDW: 14.8 % — ABNORMAL HIGH (ref 11.0–14.6)
WBC: 7.8 10*3/uL (ref 4.0–10.3)
lymph#: 2 10*3/uL (ref 0.9–3.3)

## 2011-01-25 LAB — CMP (CANCER CENTER ONLY)
ALT(SGPT): 21 U/L (ref 10–47)
AST: 35 U/L (ref 11–38)
CO2: 26 mEq/L (ref 18–33)
Calcium: 9.1 mg/dL (ref 8.0–10.3)
Chloride: 101 mEq/L (ref 98–108)
Sodium: 143 mEq/L (ref 128–145)
Total Protein: 7.9 g/dL (ref 6.4–8.1)

## 2011-01-25 MED ORDER — IOHEXOL 300 MG/ML  SOLN
80.0000 mL | Freq: Once | INTRAMUSCULAR | Status: AC | PRN
  Administered 2011-01-25: 80 mL via INTRAVENOUS

## 2011-01-29 ENCOUNTER — Other Ambulatory Visit: Payer: Self-pay | Admitting: Internal Medicine

## 2011-01-29 ENCOUNTER — Encounter (HOSPITAL_BASED_OUTPATIENT_CLINIC_OR_DEPARTMENT_OTHER): Payer: PRIVATE HEALTH INSURANCE | Admitting: Internal Medicine

## 2011-01-29 DIAGNOSIS — C3410 Malignant neoplasm of upper lobe, unspecified bronchus or lung: Secondary | ICD-10-CM

## 2011-01-29 DIAGNOSIS — Z902 Acquired absence of lung [part of]: Secondary | ICD-10-CM

## 2011-01-29 DIAGNOSIS — C3490 Malignant neoplasm of unspecified part of unspecified bronchus or lung: Secondary | ICD-10-CM

## 2011-01-29 DIAGNOSIS — N289 Disorder of kidney and ureter, unspecified: Secondary | ICD-10-CM

## 2011-01-31 ENCOUNTER — Other Ambulatory Visit: Payer: Self-pay | Admitting: *Deleted

## 2011-01-31 NOTE — Telephone Encounter (Signed)
Last refill 7/17 Call when ready @ [phone]

## 2011-02-01 MED ORDER — HYDROCODONE-ACETAMINOPHEN 7.5-500 MG PO TABS: 1.0000 | ORAL_TABLET | Freq: Four times a day (QID) | ORAL | Status: DC | PRN

## 2011-02-01 NOTE — Telephone Encounter (Signed)
Rx called in 

## 2011-03-04 ENCOUNTER — Other Ambulatory Visit: Payer: Self-pay | Admitting: *Deleted

## 2011-03-04 MED ORDER — HYDROCODONE-ACETAMINOPHEN 7.5-500 MG PO TABS: 1.0000 | ORAL_TABLET | Freq: Four times a day (QID) | ORAL | Status: DC | PRN

## 2011-03-04 MED ORDER — AMLODIPINE BESYLATE 10 MG PO TABS: 10.0000 mg | ORAL_TABLET | Freq: Every day | ORAL | Status: DC

## 2011-03-04 NOTE — Telephone Encounter (Signed)
vicodin called in

## 2011-03-04 NOTE — Telephone Encounter (Signed)
Last refill on vicodin was 8/16 Pt # [phone]

## 2011-03-14 ENCOUNTER — Ambulatory Visit (INDEPENDENT_AMBULATORY_CARE_PROVIDER_SITE_OTHER): Payer: PRIVATE HEALTH INSURANCE | Admitting: Internal Medicine

## 2011-03-14 ENCOUNTER — Encounter: Payer: Self-pay | Admitting: Internal Medicine

## 2011-03-14 ENCOUNTER — Ambulatory Visit (HOSPITAL_COMMUNITY)
Admission: RE | Admit: 2011-03-14 | Discharge: 2011-03-14 | Disposition: A | Discharge status: Home / Self Care | Payer: PRIVATE HEALTH INSURANCE | Source: Ambulatory Visit | Attending: Internal Medicine | Admitting: Internal Medicine

## 2011-03-14 VITALS — BP 124/77 | HR 67 | Temp 97.3°F | Ht 73.5 in | Wt 210.3 lb

## 2011-03-14 DIAGNOSIS — F17200 Nicotine dependence, unspecified, uncomplicated: Secondary | ICD-10-CM

## 2011-03-14 DIAGNOSIS — Z299 Encounter for prophylactic measures, unspecified: Secondary | ICD-10-CM

## 2011-03-14 DIAGNOSIS — M25559 Pain in unspecified hip: Secondary | ICD-10-CM | POA: Insufficient documentation

## 2011-03-14 DIAGNOSIS — M1610 Unilateral primary osteoarthritis, unspecified hip: Secondary | ICD-10-CM

## 2011-03-14 DIAGNOSIS — M109 Gout, unspecified: Secondary | ICD-10-CM

## 2011-03-14 DIAGNOSIS — Z23 Encounter for immunization: Secondary | ICD-10-CM

## 2011-03-14 DIAGNOSIS — M25552 Pain in left hip: Secondary | ICD-10-CM

## 2011-03-14 DIAGNOSIS — I10 Essential (primary) hypertension: Secondary | ICD-10-CM

## 2011-03-14 DIAGNOSIS — N189 Chronic kidney disease, unspecified: Secondary | ICD-10-CM

## 2011-03-14 LAB — COMPREHENSIVE METABOLIC PANEL
Albumin: 4.2 g/dL (ref 3.5–5.2)
Alkaline Phosphatase: 71 U/L (ref 39–117)
BUN: 14 mg/dL (ref 6–23)
Calcium: 9.4 mg/dL (ref 8.4–10.5)
Chloride: 103 mEq/L (ref 96–112)
Creat: 1.16 mg/dL (ref 0.50–1.35)
Glucose, Bld: 96 mg/dL (ref 70–99)
Potassium: 3.8 mEq/L (ref 3.5–5.3)
Sodium: 142 mEq/L (ref 135–145)
Total Protein: 7.5 g/dL (ref 6.0–8.3)

## 2011-03-14 MED ORDER — DICLOFENAC SODIUM 75 MG PO TBEC: 75.0000 mg | DELAYED_RELEASE_TABLET | Freq: Two times a day (BID) | ORAL | Status: DC

## 2011-03-14 NOTE — Assessment & Plan Note (Signed)
Well controlled on current treatment, No new changes made today, Will continue to monitor.   

## 2011-03-14 NOTE — Assessment & Plan Note (Signed)
Pt denies smoking

## 2011-03-14 NOTE — Assessment & Plan Note (Signed)
Will check BMET today  

## 2011-03-14 NOTE — Assessment & Plan Note (Signed)
Pain has been getting worse, will check left hip xray, and provide short dose of diclofenac for pain relief (will proceed with caution given patients mild CKD) if patient at next followup still has significant symptoms of pain, we may refer to orthopedic surgery for possible joint replacement.

## 2011-03-14 NOTE — Assessment & Plan Note (Signed)
Under good control 

## 2011-03-14 NOTE — Patient Instructions (Signed)
Please take diclofenac twice daily for your hip pain, the prescription has been sent to the pharmacy.

## 2011-03-14 NOTE — Progress Notes (Signed)
  Subjective:    Patient ID: KC SUMMERSON, male    DOB: [birth year], 65 y.o.   MRN: [id number]  HPI  65 year old male with past medical history significant for left hip arthritis, squamous cell carcinoma of the lung (status post lung resection in 2007), hypertension, hyperlipidemia, gout and gastroesophageal reflux disease presents to the outpatient clinic for a routine followup,patient today reports left hip pain has been getting worse, and therefore patient has noted increased use of vicodin, pt denies falls, or signs of joint instability. Denies fever, chills, weight loss, nausea, vomiting, Or diarrhea. Patient states that he is compliant with all of his medications.   Review of Systems  Musculoskeletal: Positive for arthralgias. Negative for joint swelling and gait problem.  All other systems reviewed and are negative.       Objective:   Physical Exam  Nursing note and vitals reviewed. Constitutional: He is oriented to person, place, and time. He appears well-developed and well-nourished.  HENT:  Head: Normocephalic and atraumatic.  Eyes: Pupils are equal, round, and reactive to light.  Neck: Normal range of motion. No JVD present. No thyromegaly present.  Cardiovascular: Normal rate, regular rhythm and normal heart sounds.   Pulmonary/Chest: Effort normal and breath sounds normal. He has no wheezes. He has no rales.  Abdominal: Soft. Bowel sounds are normal. There is no tenderness. There is no rebound.  Musculoskeletal: He exhibits no edema.       Left hip: He exhibits decreased range of motion and tenderness. He exhibits normal strength, no bony tenderness, no swelling, no crepitus, no deformity and no laceration.  Neurological: He is alert and oriented to person, place, and time.  Skin: Skin is warm and dry.          Assessment & Plan:

## 2011-03-18 LAB — DIFFERENTIAL
Lymphs Abs: 1.3
Monocytes Relative: 7
Neutro Abs: 4.1
Neutrophils Relative %: 67

## 2011-03-18 LAB — URINALYSIS, ROUTINE W REFLEX MICROSCOPIC
Ketones, ur: NEGATIVE
Nitrite: POSITIVE — AB
Specific Gravity, Urine: 1.029
pH: 6.5

## 2011-03-18 LAB — BASIC METABOLIC PANEL
BUN: 17
Calcium: 9.6
Creatinine, Ser: 1.29
GFR calc Af Amer: >60
GFR calc non Af Amer: 56 — ABNORMAL LOW

## 2011-03-18 LAB — CBC
Platelets: 193
RBC: 4.43
WBC: 6.2

## 2011-03-18 LAB — URINE MICROSCOPIC-ADD ON

## 2011-03-27 ENCOUNTER — Other Ambulatory Visit: Payer: Self-pay | Admitting: *Deleted

## 2011-03-28 MED ORDER — FUROSEMIDE 20 MG PO TABS: 20.0000 mg | ORAL_TABLET | Freq: Every day | ORAL | Status: DC

## 2011-04-01 ENCOUNTER — Other Ambulatory Visit: Payer: Self-pay | Admitting: *Deleted

## 2011-04-01 DIAGNOSIS — E785 Hyperlipidemia, unspecified: Secondary | ICD-10-CM

## 2011-04-02 ENCOUNTER — Other Ambulatory Visit: Payer: Self-pay | Admitting: *Deleted

## 2011-04-02 DIAGNOSIS — E785 Hyperlipidemia, unspecified: Secondary | ICD-10-CM

## 2011-04-03 MED ORDER — ROSUVASTATIN CALCIUM 20 MG PO TABS: 20.0000 mg | ORAL_TABLET | Freq: Every day | ORAL | Status: DC

## 2011-04-03 MED ORDER — HYDROCODONE-ACETAMINOPHEN 7.5-500 MG PO TABS: 1.0000 | ORAL_TABLET | Freq: Four times a day (QID) | ORAL | Status: DC | PRN

## 2011-04-03 NOTE — Telephone Encounter (Signed)
Forward this refill to Dr. Gilford Rile and tell him that there is no FYI on this patient.

## 2011-04-05 ENCOUNTER — Other Ambulatory Visit: Payer: Self-pay | Admitting: Thoracic Surgery (Cardiothoracic Vascular Surgery)

## 2011-04-05 DIAGNOSIS — C3490 Malignant neoplasm of unspecified part of unspecified bronchus or lung: Secondary | ICD-10-CM

## 2011-04-05 NOTE — Telephone Encounter (Signed)
Called to pharm 

## 2011-04-12 ENCOUNTER — Other Ambulatory Visit: Payer: Self-pay | Admitting: Internal Medicine

## 2011-04-12 MED ORDER — OMEPRAZOLE 20 MG PO CPDR: 20.0000 mg | DELAYED_RELEASE_CAPSULE | Freq: Two times a day (BID) | ORAL | Status: DC

## 2011-04-25 ENCOUNTER — Ambulatory Visit: Payer: PRIVATE HEALTH INSURANCE | Admitting: Thoracic Surgery (Cardiothoracic Vascular Surgery)

## 2011-04-26 ENCOUNTER — Other Ambulatory Visit: Payer: Self-pay | Admitting: *Deleted

## 2011-04-26 MED ORDER — METOPROLOL TARTRATE 50 MG PO TABS: 25.0000 mg | ORAL_TABLET | Freq: Two times a day (BID) | ORAL | Status: DC

## 2011-04-26 NOTE — Telephone Encounter (Signed)
Pt needs late Dec or Jan appt with PCP. Thanks

## 2011-04-26 NOTE — Telephone Encounter (Signed)
Message sent to front office for an appt.  

## 2011-05-02 ENCOUNTER — Ambulatory Visit: Payer: PRIVATE HEALTH INSURANCE | Admitting: Thoracic Surgery (Cardiothoracic Vascular Surgery)

## 2011-05-06 ENCOUNTER — Other Ambulatory Visit: Payer: Self-pay | Admitting: *Deleted

## 2011-05-06 MED ORDER — HYDROCODONE-ACETAMINOPHEN 7.5-500 MG PO TABS: 1.0000 | ORAL_TABLET | Freq: Four times a day (QID) | ORAL | Status: DC | PRN

## 2011-05-06 NOTE — Telephone Encounter (Signed)
Rx called in 

## 2011-05-06 NOTE — Telephone Encounter (Signed)
Last refill 10/16

## 2011-05-13 ENCOUNTER — Encounter: Payer: Self-pay | Admitting: Thoracic Surgery (Cardiothoracic Vascular Surgery)

## 2011-05-14 ENCOUNTER — Other Ambulatory Visit: Payer: Self-pay | Admitting: Internal Medicine

## 2011-05-15 ENCOUNTER — Ambulatory Visit: Payer: PRIVATE HEALTH INSURANCE | Admitting: Thoracic Surgery (Cardiothoracic Vascular Surgery)

## 2011-05-15 ENCOUNTER — Encounter: Payer: Self-pay | Admitting: Thoracic Surgery (Cardiothoracic Vascular Surgery)

## 2011-05-15 ENCOUNTER — Other Ambulatory Visit: Payer: Self-pay | Admitting: *Deleted

## 2011-05-15 ENCOUNTER — Telehealth: Payer: Self-pay | Admitting: *Deleted

## 2011-05-15 VITALS — BP 137/89 | HR 78 | Resp 20 | Ht 73.0 in | Wt 208.0 lb

## 2011-05-15 MED ORDER — INDOMETHACIN 25 MG PO CAPS: 25.0000 mg | ORAL_CAPSULE | Freq: Three times a day (TID) | ORAL | Status: DC | PRN

## 2011-05-15 MED ORDER — COLCHICINE 0.6 MG PO TABS: 0.6000 mg | ORAL_TABLET | Freq: Two times a day (BID) | ORAL | Status: DC | PRN

## 2011-05-15 NOTE — Telephone Encounter (Signed)
Pt calls and requests handicap sticker for car, may call at [phone] or [phone]

## 2011-05-15 NOTE — Telephone Encounter (Signed)
Given his bad hip he would qualify for this. Please notify [name] to compete the form for temp handicap sticker on my behalf.

## 2011-05-27 ENCOUNTER — Telehealth: Payer: Self-pay | Admitting: Licensed Clinical Social Worker

## 2011-05-27 NOTE — Telephone Encounter (Signed)
Opened in error

## 2011-05-29 ENCOUNTER — Ambulatory Visit (INDEPENDENT_AMBULATORY_CARE_PROVIDER_SITE_OTHER): Payer: PRIVATE HEALTH INSURANCE | Admitting: Thoracic Surgery (Cardiothoracic Vascular Surgery)

## 2011-05-29 ENCOUNTER — Encounter: Payer: Self-pay | Admitting: Thoracic Surgery (Cardiothoracic Vascular Surgery)

## 2011-05-29 VITALS — BP 136/84 | HR 72 | Resp 16 | Ht 73.0 in | Wt 208.0 lb

## 2011-05-29 DIAGNOSIS — Z85118 Personal history of other malignant neoplasm of bronchus and lung: Secondary | ICD-10-CM

## 2011-05-29 NOTE — Progress Notes (Signed)
HPI: [name] returns today for a scheduled 5 year followup visit. He had resection of an 8 cm, stage IB, non-small cell carcinoma back in 2007. His immediate postoperative course was palpated by respiratory failure and he required tracheostomy. He been followed since that time with no evidence of recurrent disease. He saw Dr. Arbutus Ped in August and had a CT scan at that time. He states he been doing well, his weight has been stable, he has a chronic cough that is unchanged, he has no hemoptysis. He says his breathing is good he has not had any chest pain or shortness of breath.  Current Outpatient Prescriptions  Medication Sig Dispense Refill  . amLODipine (NORVASC) 10 MG tablet Take 1 tablet (10 mg total) by mouth daily.  30 tablet  3  . aspirin 325 MG EC tablet Take 325 mg by mouth daily.        . colchicine 0.6 MG tablet Take 1 tablet (0.6 mg total) by mouth 2 (two) times daily as needed. For gout flare up  30 tablet  3  . diclofenac (VOLTAREN) 75 MG EC tablet TAKE 1 TABLET (75 MG TOTAL) BY MOUTH 2 (TWO) TIMES DAILY.  60 tablet  0  . furosemide (LASIX) 20 MG tablet Take 1 tablet (20 mg total) by mouth daily.  30 tablet  2  . HYDROcodone-acetaminophen (LORTAB) 7.5-500 MG per tablet Take 1 tablet by mouth every 6 (six) hours as needed. For pain  90 tablet  0  . indomethacin (INDOCIN) 25 MG capsule Take 1-2 capsules (25-50 mg total) by mouth 3 (three) times daily as needed. For gout pain  60 capsule  3  . metoprolol (LOPRESSOR) 50 MG tablet Take 0.5 tablets (25 mg total) by mouth 2 (two) times daily.  31 tablet  5  . omeprazole (PRILOSEC) 20 MG capsule Take 1 capsule (20 mg total) by mouth 2 (two) times daily.  60 capsule  2  . rosuvastatin (CRESTOR) 20 MG tablet Take 1 tablet (20 mg total) by mouth daily.  30 tablet  6     Physical Exam: BP 136/84  Pulse 72  Resp 16  Ht 6\' 1"  (1.854 m)  Wt 208 lb (94.348 kg)  BMI 27.44 kg/m2  SpO2 98% Gen.: Well-developed and well-nourished and in no  acute distress Neuro: A. and O. x3, grossly intact No cervical or supraclavicular, axillary or epitrochlear adenopathy Lungs clear Cardiac slightly irregular, no rubs or murmurs   Diagnostic Tests: None today CT of chest from 01/25/2011 shows postoperative changes with no evidence of recurrent disease  Impression: 65 year old gentleman now 5 years out from lobectomy for stage IB non-small cell carcinoma with no evidence recurrent disease. At this point is considered a cure.  Dr. Arbutus Ped does plan to continue to follow him on an annual basis.  Plan: I would be happy to see [name] back at any time that could be of any further assistance with his care.

## 2011-06-05 ENCOUNTER — Ambulatory Visit (HOSPITAL_COMMUNITY)
Admission: RE | Admit: 2011-06-05 | Discharge: 2011-06-05 | Disposition: A | Discharge status: Home / Self Care | Payer: PRIVATE HEALTH INSURANCE | Source: Ambulatory Visit | Attending: Cardiology | Admitting: Cardiology

## 2011-06-05 ENCOUNTER — Encounter: Payer: Self-pay | Admitting: Internal Medicine

## 2011-06-05 ENCOUNTER — Ambulatory Visit (INDEPENDENT_AMBULATORY_CARE_PROVIDER_SITE_OTHER): Payer: PRIVATE HEALTH INSURANCE | Admitting: Internal Medicine

## 2011-06-05 DIAGNOSIS — N189 Chronic kidney disease, unspecified: Secondary | ICD-10-CM

## 2011-06-05 DIAGNOSIS — M1610 Unilateral primary osteoarthritis, unspecified hip: Secondary | ICD-10-CM

## 2011-06-05 DIAGNOSIS — I129 Hypertensive chronic kidney disease with stage 1 through stage 4 chronic kidney disease, or unspecified chronic kidney disease: Secondary | ICD-10-CM

## 2011-06-05 DIAGNOSIS — I10 Essential (primary) hypertension: Secondary | ICD-10-CM

## 2011-06-05 DIAGNOSIS — I447 Left bundle-branch block, unspecified: Secondary | ICD-10-CM

## 2011-06-05 DIAGNOSIS — R9431 Abnormal electrocardiogram [ECG] [EKG]: Secondary | ICD-10-CM | POA: Insufficient documentation

## 2011-06-05 DIAGNOSIS — F17200 Nicotine dependence, unspecified, uncomplicated: Secondary | ICD-10-CM

## 2011-06-05 DIAGNOSIS — I499 Cardiac arrhythmia, unspecified: Secondary | ICD-10-CM

## 2011-06-05 LAB — BASIC METABOLIC PANEL
BUN: 18 mg/dL (ref 6–23)
Chloride: 105 mEq/L (ref 96–112)
Creat: 1.33 mg/dL (ref 0.50–1.35)

## 2011-06-05 MED ORDER — HYDROCODONE-ACETAMINOPHEN 2.5-500 MG PO TABS: 1.0000 | ORAL_TABLET | Freq: Four times a day (QID) | ORAL | Status: AC | PRN

## 2011-06-05 MED ORDER — HYDROCODONE-ACETAMINOPHEN 7.5-500 MG PO TABS: 1.0000 | ORAL_TABLET | Freq: Four times a day (QID) | ORAL | Status: DC | PRN

## 2011-06-05 MED ORDER — DICLOFENAC SODIUM 75 MG PO TBEC: 75.0000 mg | DELAYED_RELEASE_TABLET | Freq: Two times a day (BID) | ORAL | Status: DC

## 2011-06-05 NOTE — Assessment & Plan Note (Signed)
Well controlled on current treatment, No new changes made today, Will continue to monitor.   

## 2011-06-05 NOTE — Patient Instructions (Signed)
Please take your medication as instructed

## 2011-06-05 NOTE — Progress Notes (Signed)
Subjective:     Patient ID: [name], male   DOB: [birth year], 65 y.o.   MRN: [id number]  HPI  Is a 65 year old male with a past medical history listed below, presents to the outpatient clinic for routine followup, reports that he recently had a followup with his cardiothoracic surgeon and informed him that he does not need to followup with him anymore given that he has been cancer free for the past 5 years. Patient would like refills of his medications. Denies any chest pain or shortness of breath.   Patient Active Problem List  Diagnoses  . CARCINOMA, LUNG, SQUAMOUS CELL  . COLONIC POLYPS, ADENOMATOUS  . HYPERLIPIDEMIA  . GOUT  . TOBACCO ABUSE  . HYPERTENSION  . GERD  . BARRETT'S ESOPHAGUS  . CHRONIC KIDNEY DISEASE UNSPECIFIED  . ARTHRITIS, LEFT HIP  . Erectile dysfunction  . CHF (congestive heart failure)   Current Outpatient Prescriptions on File Prior to Visit  Medication Sig Dispense Refill  . amLODipine (NORVASC) 10 MG tablet Take 1 tablet (10 mg total) by mouth daily.  30 tablet  3  . aspirin 325 MG EC tablet Take 325 mg by mouth daily.        . colchicine 0.6 MG tablet Take 1 tablet (0.6 mg total) by mouth 2 (two) times daily as needed. For gout flare up  30 tablet  3  . furosemide (LASIX) 20 MG tablet Take 1 tablet (20 mg total) by mouth daily.  30 tablet  2  . indomethacin (INDOCIN) 25 MG capsule Take 1-2 capsules (25-50 mg total) by mouth 3 (three) times daily as needed. For gout pain  60 capsule  3  . metoprolol (LOPRESSOR) 50 MG tablet Take 0.5 tablets (25 mg total) by mouth 2 (two) times daily.  31 tablet  5  . omeprazole (PRILOSEC) 20 MG capsule Take 1 capsule (20 mg total) by mouth 2 (two) times daily.  60 capsule  2  . rosuvastatin (CRESTOR) 20 MG tablet Take 1 tablet (20 mg total) by mouth daily.  30 tablet  6   Allergies  Allergen Reactions  . Candesartan Cilexetil-Hctz     REACTION: Acute renal failure  . Hydrochlorothiazide     REACTION: Acute renal  failure     Review of Systems  All other systems reviewed and are negative.       Objective:   Physical Exam  Nursing note and vitals reviewed. Constitutional: He is oriented to person, place, and time. He appears well-developed and well-nourished.  HENT:  Head: Normocephalic and atraumatic.  Eyes: Pupils are equal, round, and reactive to light.  Neck: Normal range of motion. No JVD present. No thyromegaly present.  Cardiovascular: Normal rate and normal heart sounds.  An irregular rhythm present.  Pulmonary/Chest: Effort normal and breath sounds normal. He has no wheezes. He has no rales.  Abdominal: Soft. Bowel sounds are normal. There is no tenderness. There is no rebound.  Musculoskeletal: Normal range of motion. He exhibits no edema.  Neurological: He is alert and oriented to person, place, and time.  Skin: Skin is warm and dry.

## 2011-06-05 NOTE — Assessment & Plan Note (Signed)
Patient had irregular heart beat on exam with a regular rate, EKG checked which showed left bundle branch block with PVCs which would explain his exam. Patient is on metoprolol 25 mg twice a day, continue this medication at current dose to suppress PVCs and it is possible, if his heart rate is faster than 70 at next visit consider increasing Toprol to 50 mg twice daily

## 2011-06-05 NOTE — Assessment & Plan Note (Addendum)
Pain is stable, patient requests refill for hydrocodone, however given his negative hip xray, i am not sure of the reason for his chronic narcotic, especially that the patient is not in pain now. Will taper his narcotics with a goal of stopping them in the next months and check urine drug screen today. Pt agrees with plan.

## 2011-06-05 NOTE — Assessment & Plan Note (Signed)
Patient was counseled on smoking cessation strategies including medications and behavior modification options. Patient said she was not ready to stop smoking at this time.    

## 2011-06-05 NOTE — Assessment & Plan Note (Signed)
Check basic metabolic panel today to evaluate renal function especially given the patient takes NSAIDs for his chronic pain

## 2011-06-07 LAB — PRESCRIPTION ABUSE MONITORING 15P, URINE
Barbiturate Screen, Urine: NEGATIVE NG/ML
Buprenorphine, Urine: NEGATIVE NG/ML
Cannabinoid Scrn, Ur: NEGATIVE NG/ML
Oxycodone Screen, Ur: NEGATIVE NG/ML
Tramadol Scrn, Ur: NEGATIVE NG/ML

## 2011-06-07 LAB — OPIATES/OPIOIDS (LC/MS-MS)
Codeine Urine: NEGATIVE NG/ML
Heroin (6-AM), UR: NEGATIVE NG/ML
Hydromorphone: 133 NG/ML — ABNORMAL HIGH
Morphine Urine: NEGATIVE NG/ML

## 2011-06-18 ENCOUNTER — Other Ambulatory Visit: Payer: Self-pay | Admitting: Internal Medicine

## 2011-06-19 IMAGING — CT CT CHEST W/ CM
2 of 4 series · 15 of 36 positions shown, 18 images · IV contrast (agent unspecified)
Comparison: 07/28/2008

CLINICAL DATA: Lung CA; resection only.

CT CHEST WITH CONTRAST
TECHNIQUE: Multidetector CT imaging of the chest was performed
following the standard protocol during bolus administration of
intravenous contrast.
Contrast: 80 ml [id number] IV

[Series 2: chest with st · axial · 0.83mm/px · z∈[-382,-102]mm · 12 of 66 slices shown, 15 images]
[im 5/66  mediastinal]
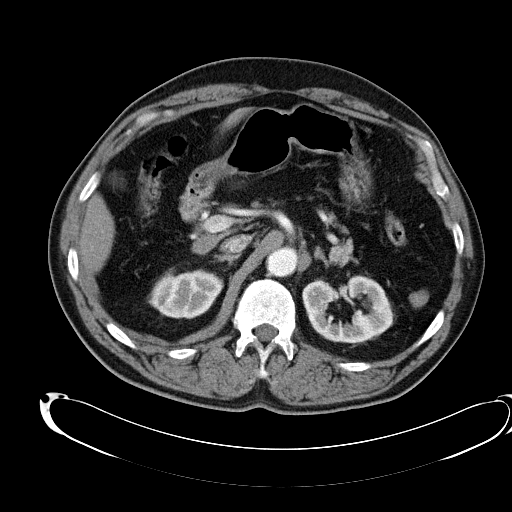
[im 5/66  lung]
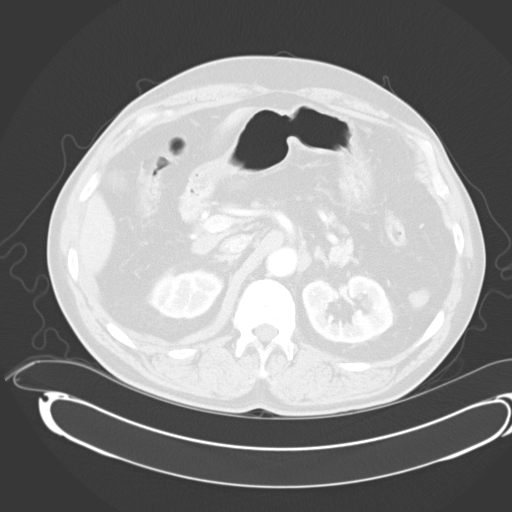
[im 10/66  lung]
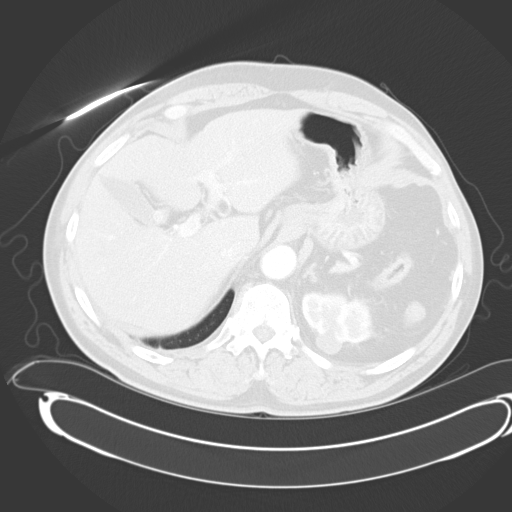
[im 14/66  lung]
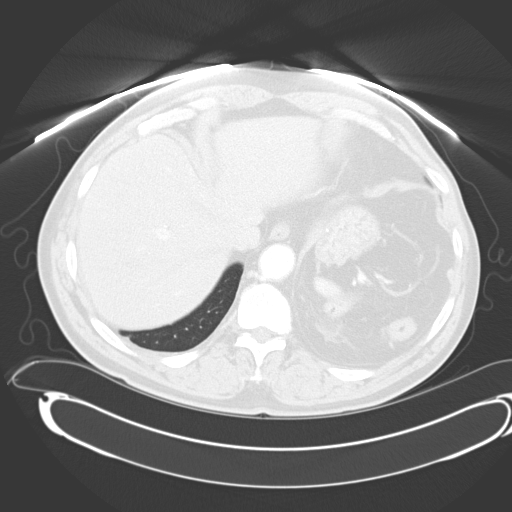
[im 19/66  lung]
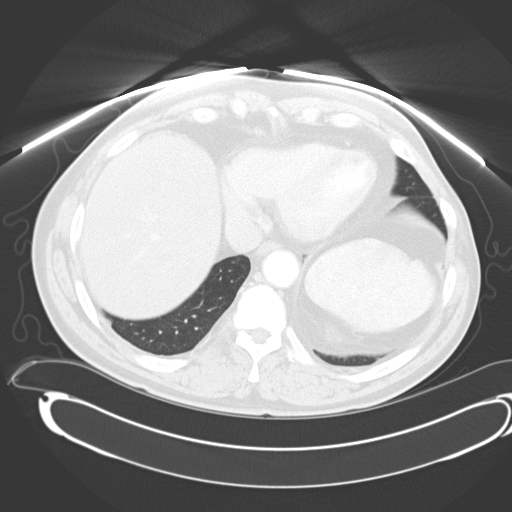
[im 24/66  mediastinal]
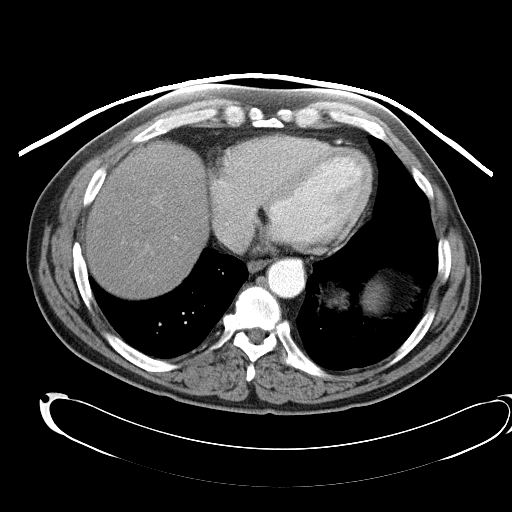
[im 24/66  lung]
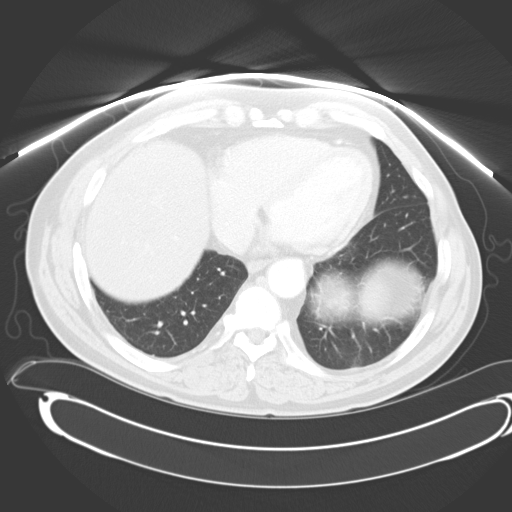
[im 28/66  lung]
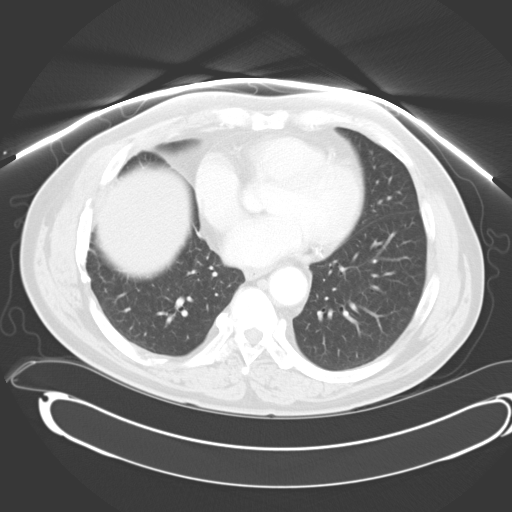
[im 38/66  lung]
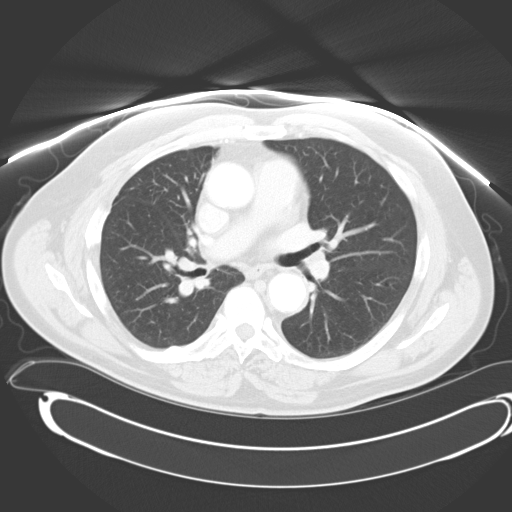
[im 42/66  lung]
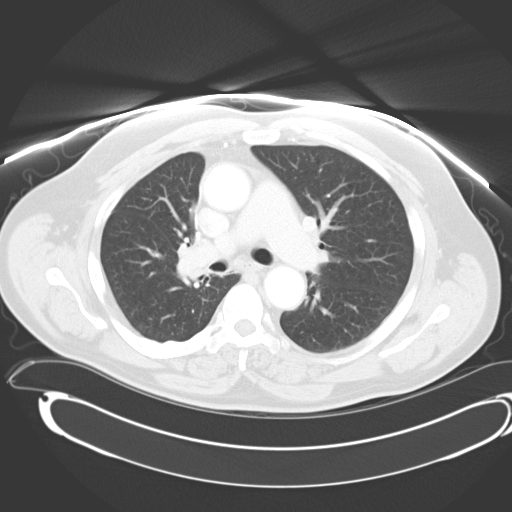
[im 47/66  mediastinal]
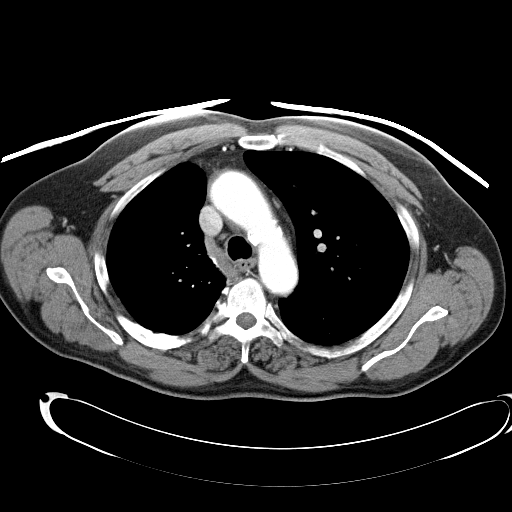
[im 47/66  lung]
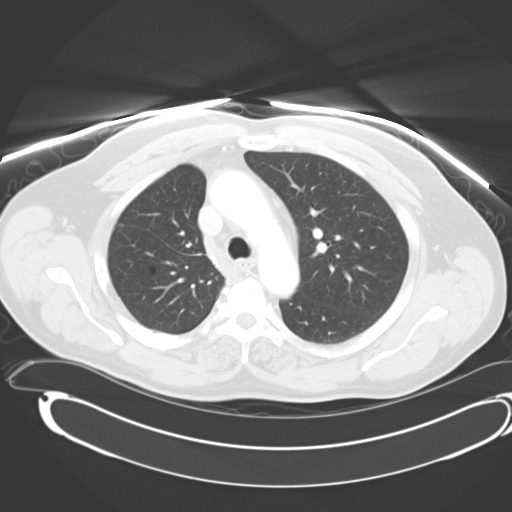
[im 52/66  lung]
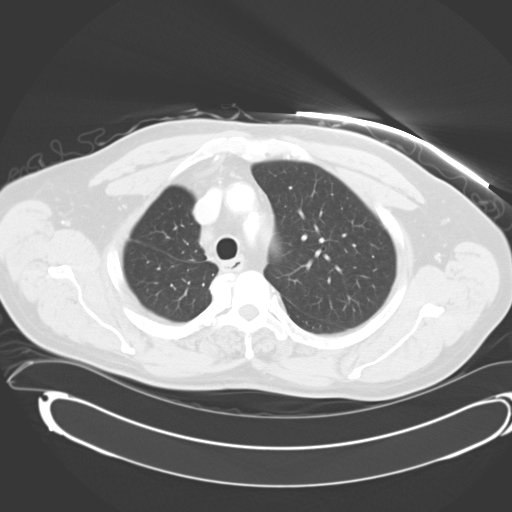
[im 56/66  lung]
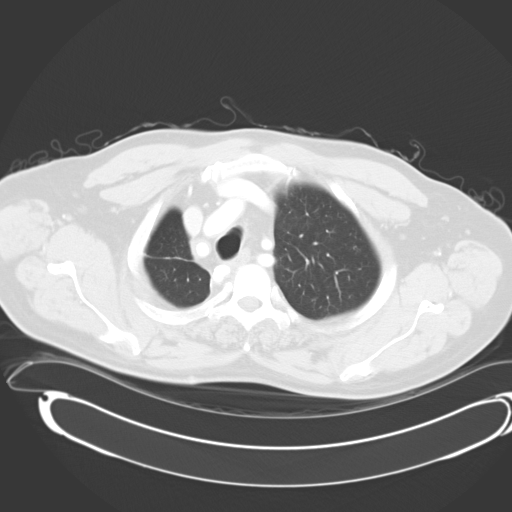
[im 61/66  lung]
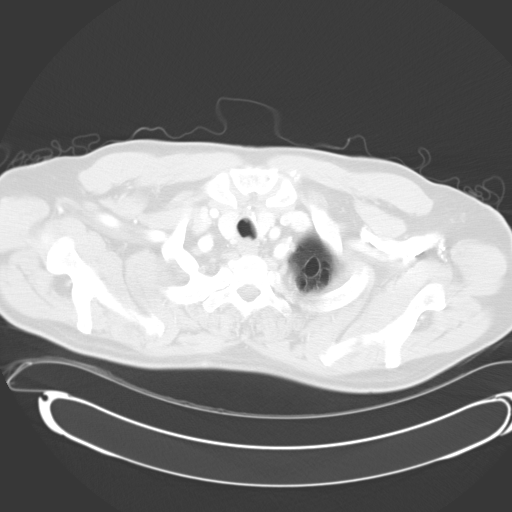

[Series 602: <mpr thick range> · coronal · 0.83mm/px · 3 of 84 slices shown]
[im 17/84  lung]
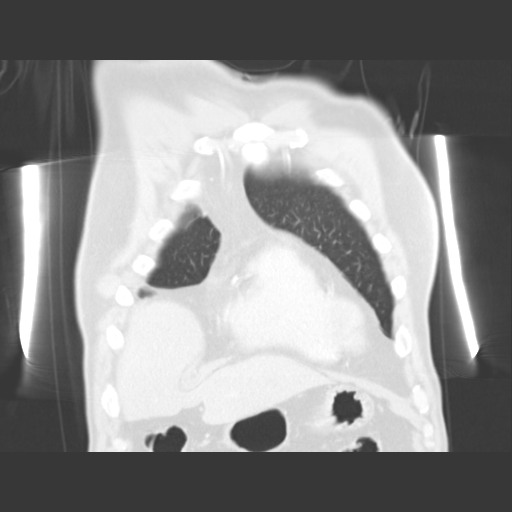
[im 34/84  lung]
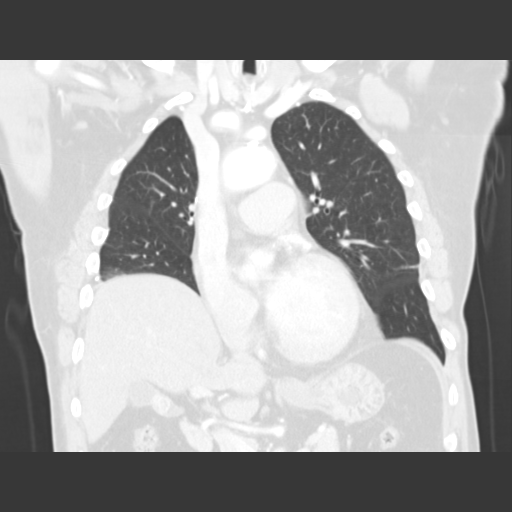
[im 50/84  lung]
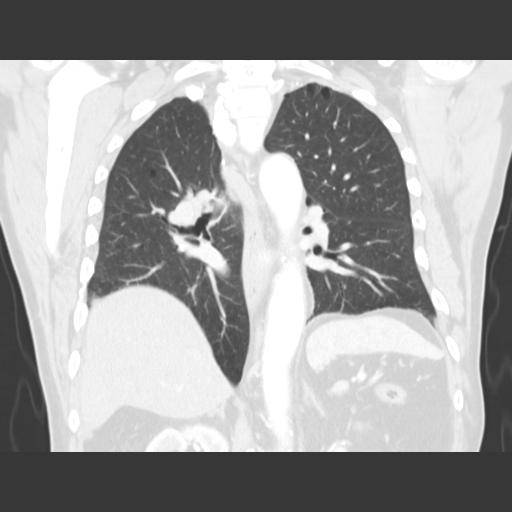

[15 of 36 positions shown; findings below may reference images not displayed]

FINDINGS: Calcified right and left coronary arteries.  No
pericardial or pleural effusions.  No pathologically enlarged lymph
nodes.  No mass lesion. No pulmonary nodules. Chest wall structures
are intact.

Gallstones appear to be located near the neck of the gallbladder.
Right renal cyst to is stable. Indeterminate left renal lesion is
stable.  Recommend continued followup. Adrenal glands normal.
IMPRESSION: Stable chest CT scan.  Negative for recurrent/residual tumor or
metastatic involvement of the chest. Coronary artery disease.
Stable indeterminate left renal lesion, possibly a complicated
cyst.

## 2011-06-24 ENCOUNTER — Other Ambulatory Visit: Payer: Self-pay | Admitting: Internal Medicine

## 2011-06-30 IMAGING — CR DG HIP (WITH OR WITHOUT PELVIS) 2-3V*L*
3 series · 3 of 3 positions shown · non-contrast
Comparison: None

CLINICAL DATA: Left hip pain worsening.  Query metastatic disease
versus avascular necrosis.

LEFT HIP - COMPLETE 2+ VIEW

[t pelvis a.p.]
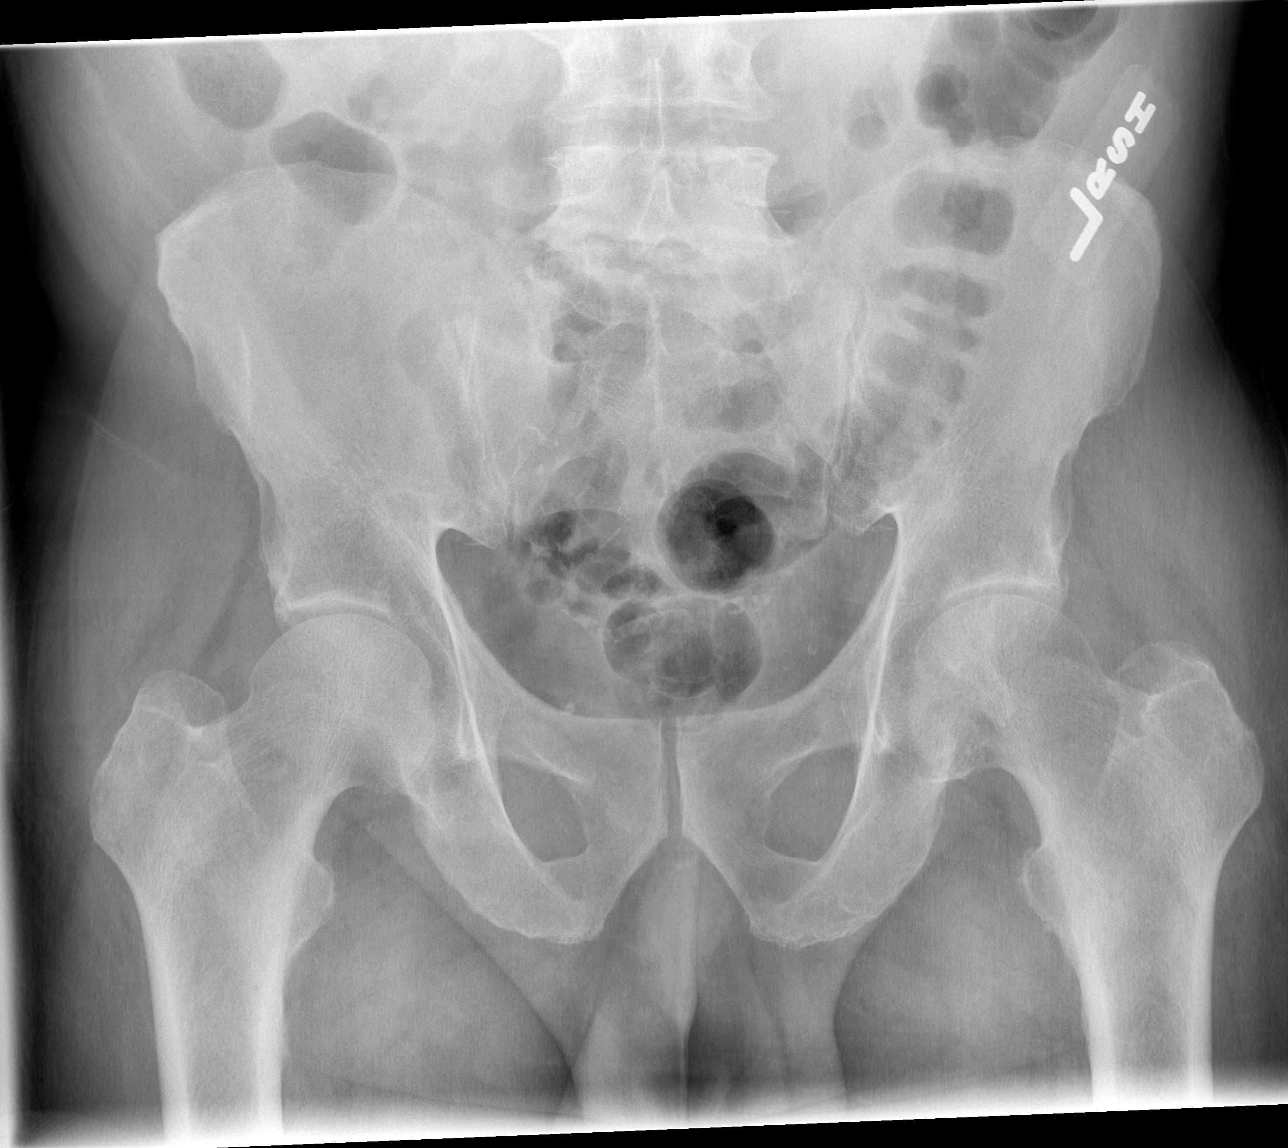

[t hip ap left]
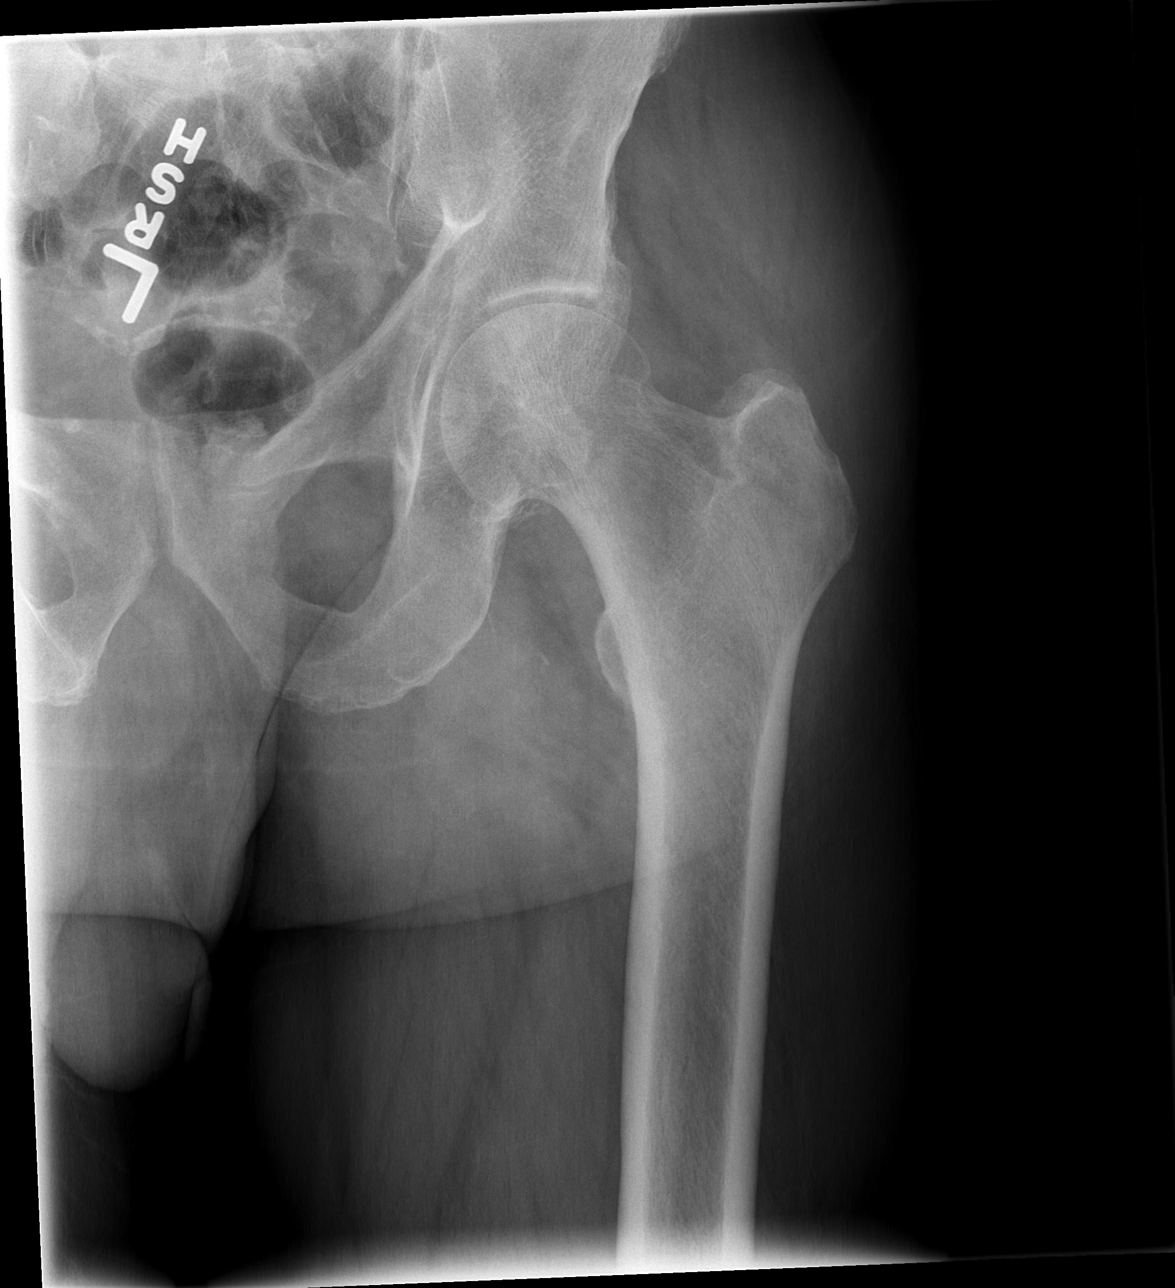

[t hip frog leg left]
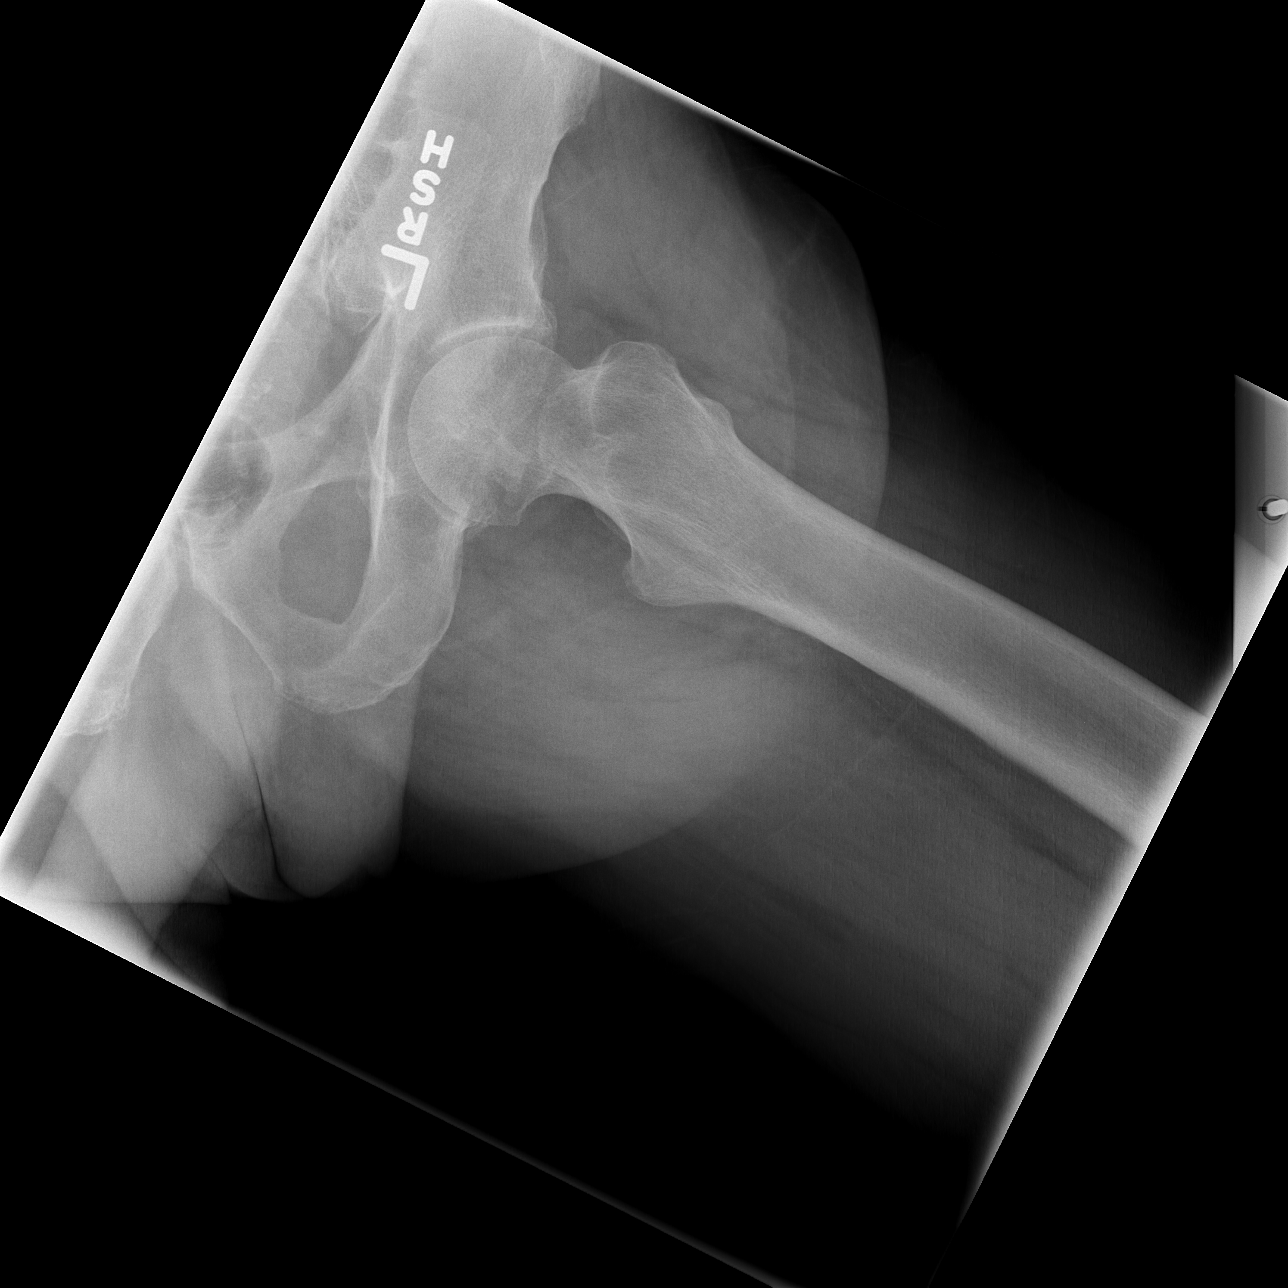

[3 of 3 positions shown; findings below may reference images not displayed]

FINDINGS: Subtle patchy osteopenic appearance of the left femoral
head. Metastatic disease cannot be excluded.  No typical findings
of AVN.  Calcified vas deferens compatible with diabetes mellitus

Sacroiliac joints unremarkable.  Hip joint spaces maintained.  No
osteophytic formation.
IMPRESSION: Patchy lucencies left femoral head potentially due to metastatic
disease.  Consider MRI of the left hip.

## 2011-07-06 IMAGING — MR MR HIP*L* W/O CM
4 of 5 series · 19 of 40 positions shown · non-contrast
Comparison: Plain films left hip 02/07/2009 and 04/08/2008.  CT
scan 04/08/2008.

CLINICAL DATA: History of lung cancer.  Left hip pain.

MRI OF THE LEFT HIP WITHOUT CONTRAST
TECHNIQUE: Multiplanar, multisequence MR imaging was performed. No
intravenous contrast was administered.

[Series 3: T1 · coronal · 5.0mm · 0.78mm/px · 3 of 24 slices shown (1 of 2)]
[im 4/24]
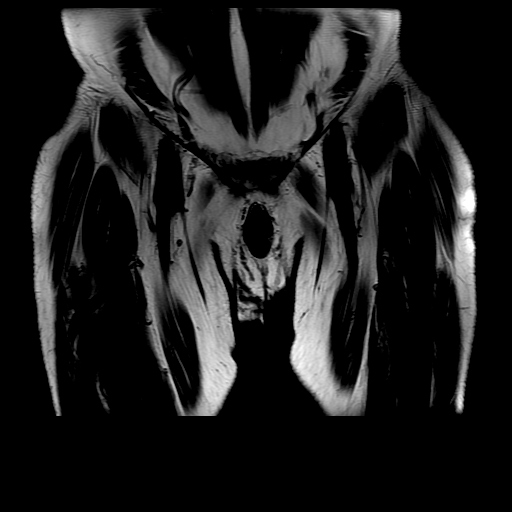
[im 14/24]
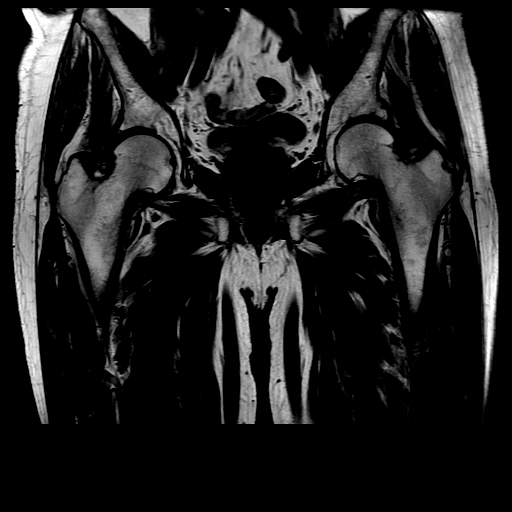
[im 20/24]
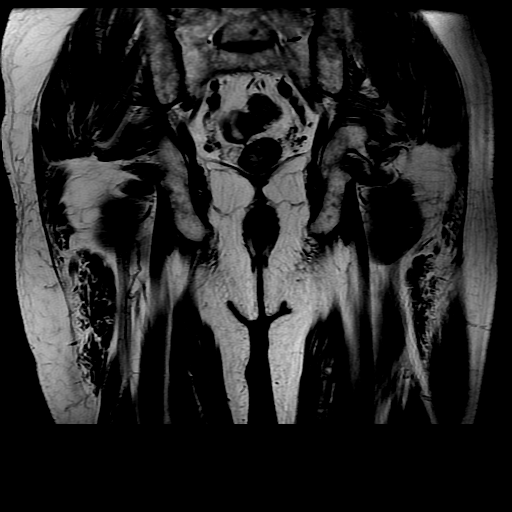

[Series 5: T1 · axial · 6.0mm · 0.70mm/px · z∈[+38,+143]mm · 3 of 23 slices shown (2 of 2)]
[im 4/23]
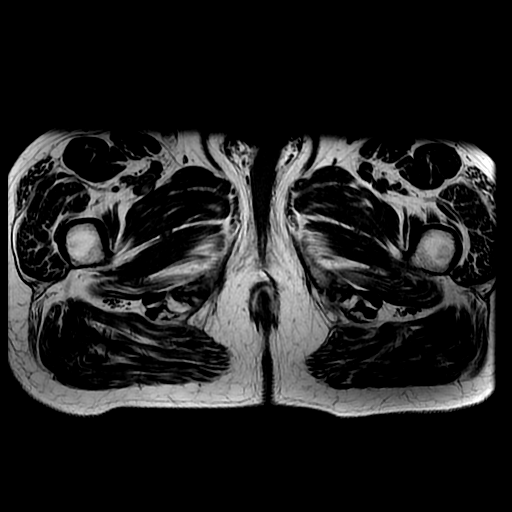
[im 12/23]
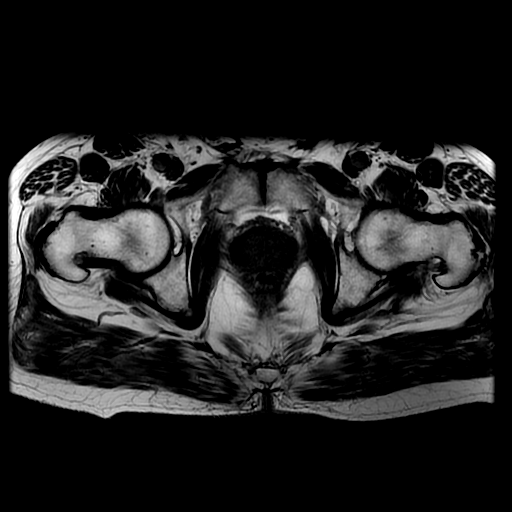
[im 19/23]
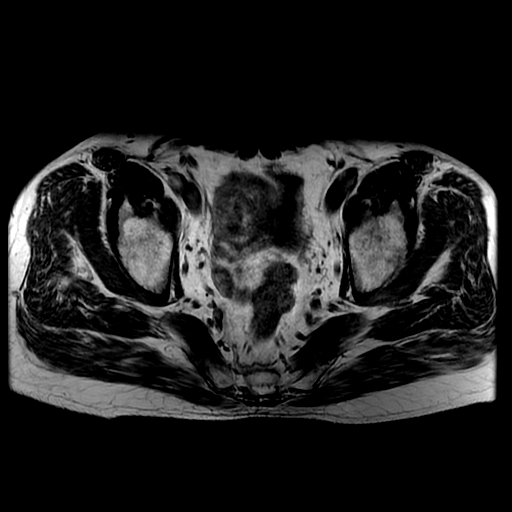

[Series 6: T2 fat-sat · axial · 6.0mm · 0.70mm/px · z∈[+17,+143]mm · 5 of 23 slices shown]
[im 1/23]
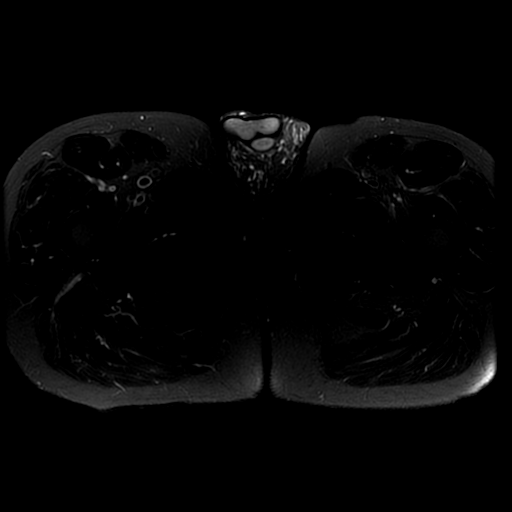
[im 4/23]
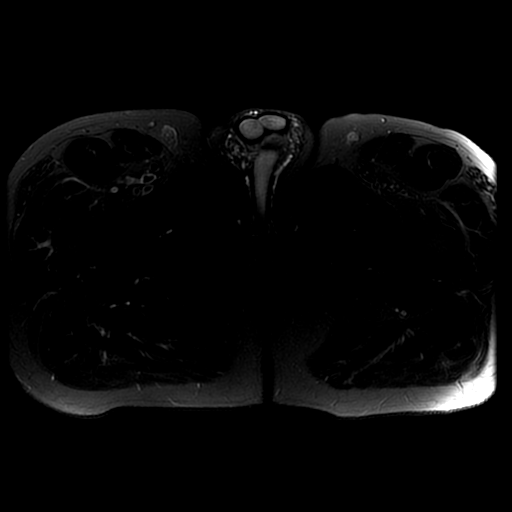
[im 8/23]
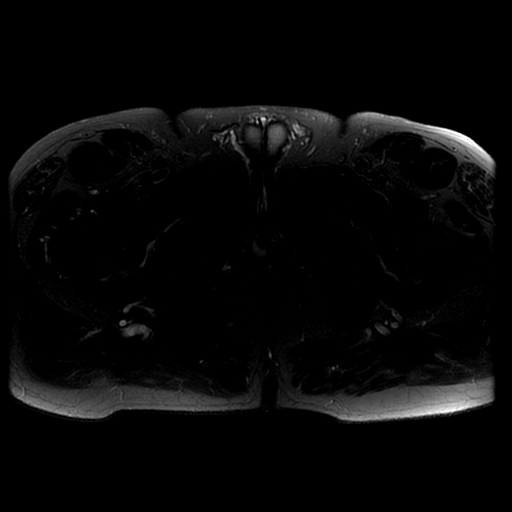
[im 12/23]
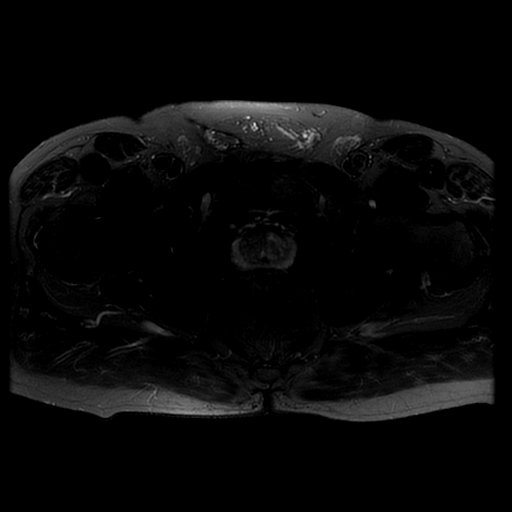
[im 19/23]
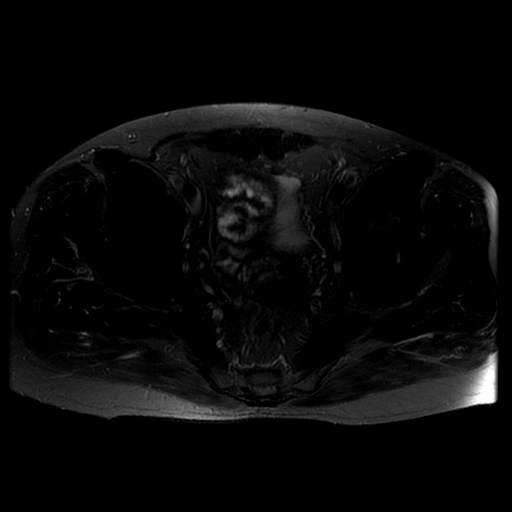

[Series 7: PD fat-sat · sagittal · 4.0mm · 0.59mm/px · 8 of 33 slices shown]
[im 1/33]
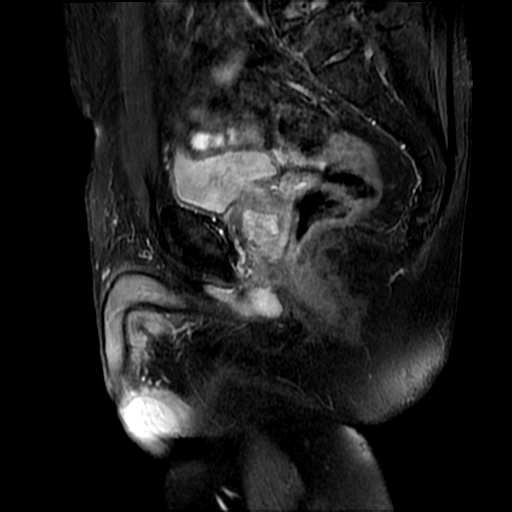
[im 4/33]
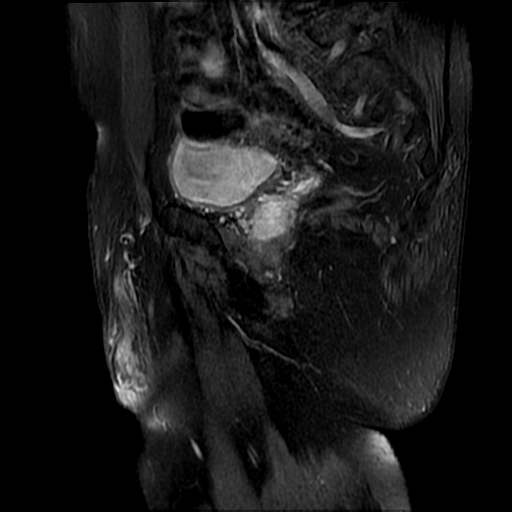
[im 11/33]
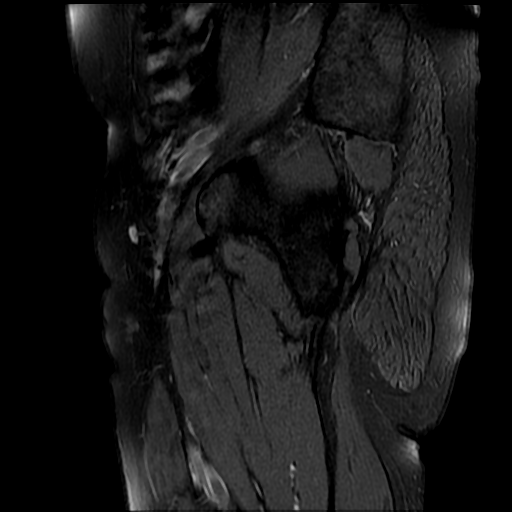
[im 15/33]
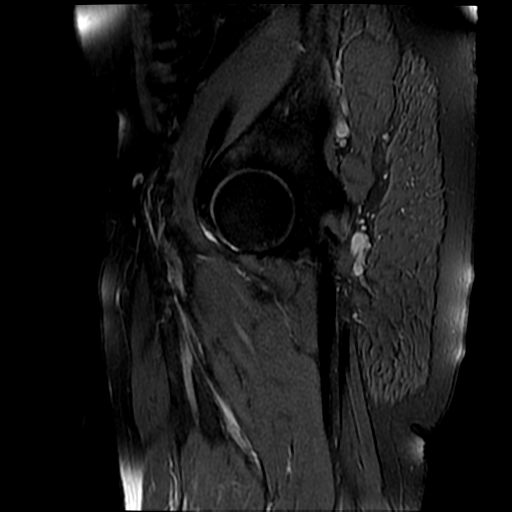
[im 18/33]
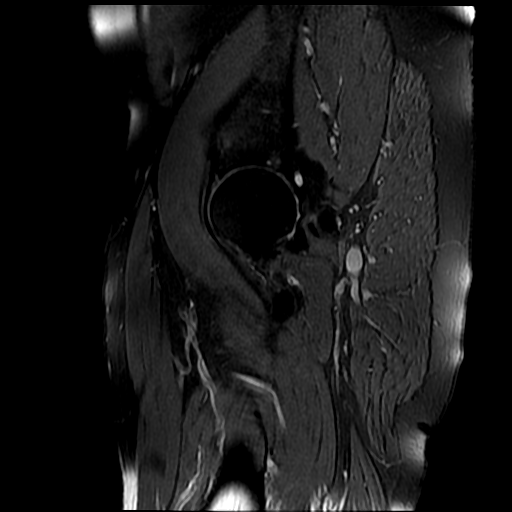
[im 22/33]
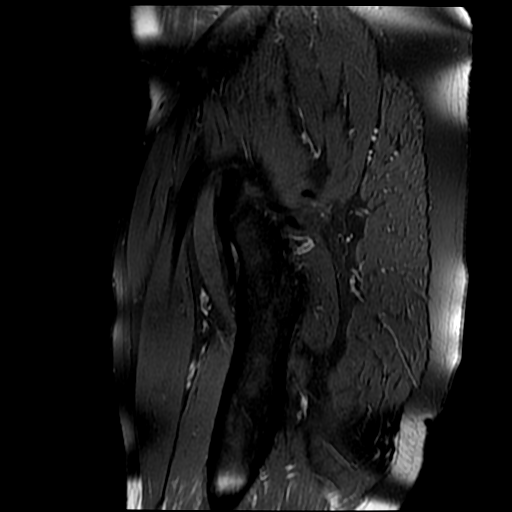
[im 29/33]
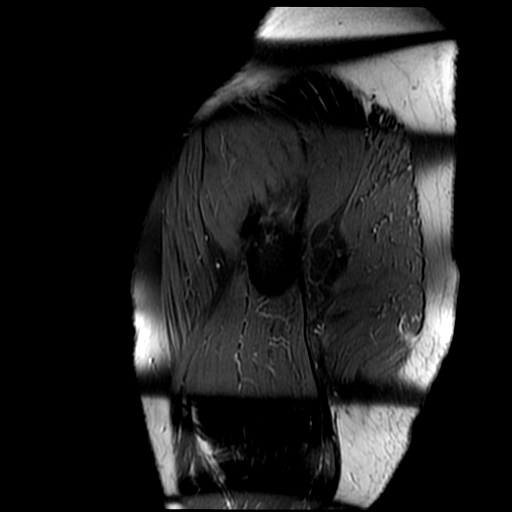
[im 33/33]
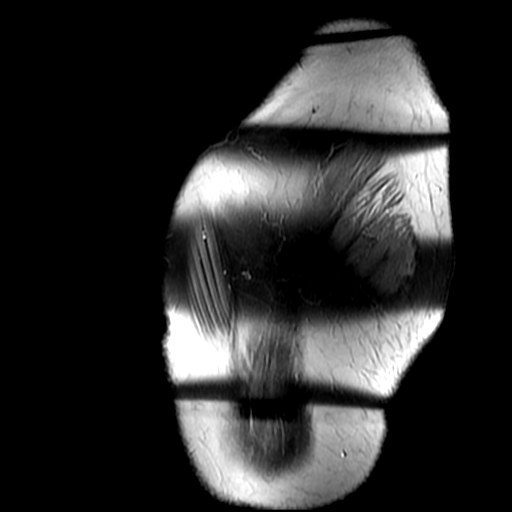

[19 of 40 positions shown; findings below may reference images not displayed]

FINDINGS: There is no evidence of metastatic disease.  The patient
has a hip osteoarthritis which appears worse on the left with
subchondral cyst formation seen in the left acetabular roof.
Marrow signal in the femoral heads is normal.  There is no fracture
or evidence of avascular necrosis.  Muscles and tendons appear
normal.  No evidence of bursitis.  Sacroiliac joints are
unremarkable.  Imaged intrapelvic contents are unremarkable.
IMPRESSION: 1.  Negative for metastatic disease.
2.  Left worse than right hip degenerative disease.

## 2011-07-09 ENCOUNTER — Other Ambulatory Visit: Payer: Self-pay | Admitting: *Deleted

## 2011-07-09 DIAGNOSIS — M1610 Unilateral primary osteoarthritis, unspecified hip: Secondary | ICD-10-CM

## 2011-07-09 MED ORDER — HYDROCODONE-ACETAMINOPHEN 7.5-500 MG PO TABS: 1.0000 | ORAL_TABLET | Freq: Four times a day (QID) | ORAL | Status: DC | PRN

## 2011-07-09 NOTE — Telephone Encounter (Signed)
Pt is requesting a refill on Hydrocodone-Acetaminophine-2.5-500.  Take 1 tablet by mouth every 6 hours as needed for pain.  Angelina Ok, RN 07/09/2011 11:28 AM.

## 2011-07-11 NOTE — Telephone Encounter (Signed)
Called to pharm1/23

## 2011-07-18 NOTE — Telephone Encounter (Signed)
Refill was completed and called to pharmacy.  Angelina Ok, RN 07/18/2011 3:31 PM.

## 2011-07-18 NOTE — Telephone Encounter (Signed)
Opened in error

## 2011-07-19 ENCOUNTER — Encounter: Payer: Self-pay | Admitting: Internal Medicine

## 2011-07-25 ENCOUNTER — Other Ambulatory Visit: Payer: Self-pay | Admitting: Internal Medicine

## 2011-08-12 ENCOUNTER — Other Ambulatory Visit: Payer: Self-pay | Admitting: *Deleted

## 2011-08-12 DIAGNOSIS — M1610 Unilateral primary osteoarthritis, unspecified hip: Secondary | ICD-10-CM

## 2011-08-12 MED ORDER — HYDROCODONE-ACETAMINOPHEN 7.5-500 MG PO TABS: 1.0000 | ORAL_TABLET | Freq: Four times a day (QID) | ORAL | Status: DC | PRN

## 2011-08-13 NOTE — Telephone Encounter (Signed)
Hydrocodone 7.5/500mg rx called to [facility]. 

## 2011-08-20 ENCOUNTER — Encounter: Payer: Self-pay | Admitting: Internal Medicine

## 2011-08-20 ENCOUNTER — Encounter: Payer: PRIVATE HEALTH INSURANCE | Admitting: Internal Medicine

## 2011-08-20 ENCOUNTER — Ambulatory Visit (INDEPENDENT_AMBULATORY_CARE_PROVIDER_SITE_OTHER): Payer: PRIVATE HEALTH INSURANCE | Admitting: Internal Medicine

## 2011-08-20 VITALS — BP 145/85 | HR 79 | Temp 97.0°F | Ht 73.5 in | Wt 213.1 lb

## 2011-08-20 DIAGNOSIS — I10 Essential (primary) hypertension: Secondary | ICD-10-CM

## 2011-08-20 DIAGNOSIS — M1610 Unilateral primary osteoarthritis, unspecified hip: Secondary | ICD-10-CM

## 2011-08-20 MED ORDER — ASPIRIN EC 81 MG PO TBEC: 81.0000 mg | DELAYED_RELEASE_TABLET | Freq: Every day | ORAL | Status: DC

## 2011-08-20 MED ORDER — DICLOFENAC SODIUM 75 MG PO TBEC: 75.0000 mg | DELAYED_RELEASE_TABLET | Freq: Two times a day (BID) | ORAL | Status: DC

## 2011-08-20 NOTE — Assessment & Plan Note (Signed)
Pain well controlled on Diclofenac.  Patient denies abdominal discomfort, BRBPR, dark stools, or other adverse event.  He reports daily compliance with PPI although occasionally misses pm dose.  Discussed importance of PPI therapy while taking NSAIDs to prevent adverse GI effects.  Will change daily ASA from 325mg  to 81mg  to decrease bleeding risk.  Advised pt to not use both Indomethacin and Diclofenac at the same time as this would result in increased risk of GI bleed, ARF, etc.

## 2011-08-20 NOTE — Patient Instructions (Signed)
Schedule a follow up visit with Dr. Gilford Rile in May or June, sooner if needed. Stop taking 325 mg of Aspirin and start taking low dose (81mg ) of Aspirin daily. Keep taking you medicines as directed.

## 2011-08-20 NOTE — Assessment & Plan Note (Signed)
Stable on current regimen.  Metabolic panel from 05/2011 revealed normal electrolytes and elevated Cr of 1.3, however this is still at baseline for [name].  No changes to current medication regimen at this time.  Pt to f/u in 66mo.

## 2011-08-20 NOTE — Progress Notes (Signed)
Patient ID: [name], male   DOB: [birth year], 66 y.o.   MRN: [id number]  Patient is a 66 year old male presenting for followup visit.  He is currently using diclofenac bid for control of his left hip pain.  He is not regularly taking Indomethacin; he uses this only as needed for gout flares - last flare was 3-4 months ago.  He is taking prilosec bid for GERD; he occasionally misses pm doses.  He has no other concerns or complaints today.   Review of systems: Constitutional: Negative for fever, chills, diaphoresis, activity change, appetite change, fatigue and unexpected weight change.  HENT: Negative for hearing loss, congestion and neck stiffness.   Eyes: Negative for photophobia, pain and visual disturbance.  Respiratory: Negative for cough, chest tightness, shortness of breath and wheezing.   Cardiovascular: Negative for chest pain and palpitations.  Gastrointestinal: Negative for abdominal pain, blood in stool and anal bleeding.  Genitourinary: Negative for dysuria, hematuria and difficulty urinating.  Musculoskeletal: Negative for joint swelling.  Neurological: Negative for dizziness, syncope, speech difficulty, weakness, numbness and headaches.   Objective:   Physical Exam  Nursing note and vitals reviewed. Constitutional: He is oriented to person, place, and time. He appears well-developed and well-nourished.  HENT:  Head: Normocephalic and atraumatic.  Eyes: Pupils are equal, round, and reactive to light.  Neck: Normal range of motion. No JVD present. No thyromegaly present.  Cardiovascular: Normal rate and normal heart sounds.  Regular rhythm.  Pulmonary/Chest: Effort normal and breath sounds normal. He has no wheezes. He has no rales.  Abdominal: Soft. Bowel sounds are normal. There is no tenderness. There is no rebound.  Musculoskeletal: Normal range of motion. He exhibits no edema.  Neurological: He is alert and oriented to person, place, and time.  Skin: Skin is warm  and dry.

## 2011-08-27 ENCOUNTER — Other Ambulatory Visit: Payer: Self-pay | Admitting: Internal Medicine

## 2011-09-19 ENCOUNTER — Other Ambulatory Visit: Payer: Self-pay | Admitting: Internal Medicine

## 2011-10-10 ENCOUNTER — Other Ambulatory Visit: Payer: Self-pay | Admitting: *Deleted

## 2011-10-10 DIAGNOSIS — M1610 Unilateral primary osteoarthritis, unspecified hip: Secondary | ICD-10-CM

## 2011-10-10 MED ORDER — HYDROCODONE-ACETAMINOPHEN 7.5-500 MG PO TABS: 1.0000 | ORAL_TABLET | Freq: Four times a day (QID) | ORAL | Status: DC | PRN

## 2011-10-10 NOTE — Telephone Encounter (Signed)
Call when ready @ [phone]

## 2011-10-11 NOTE — Telephone Encounter (Signed)
Rx called in 

## 2011-10-16 ENCOUNTER — Other Ambulatory Visit: Payer: Self-pay | Admitting: Internal Medicine

## 2011-10-17 ENCOUNTER — Encounter: Payer: PRIVATE HEALTH INSURANCE | Admitting: Internal Medicine

## 2011-10-20 ENCOUNTER — Other Ambulatory Visit: Payer: Self-pay | Admitting: Internal Medicine

## 2011-10-23 ENCOUNTER — Other Ambulatory Visit: Payer: Self-pay | Admitting: Internal Medicine

## 2011-10-28 ENCOUNTER — Encounter: Payer: PRIVATE HEALTH INSURANCE | Admitting: Internal Medicine

## 2011-11-08 ENCOUNTER — Other Ambulatory Visit: Payer: Self-pay | Admitting: *Deleted

## 2011-11-08 DIAGNOSIS — M1610 Unilateral primary osteoarthritis, unspecified hip: Secondary | ICD-10-CM

## 2011-11-08 MED ORDER — HYDROCODONE-ACETAMINOPHEN 7.5-500 MG PO TABS: 1.0000 | ORAL_TABLET | Freq: Four times a day (QID) | ORAL | Status: DC | PRN

## 2011-11-08 NOTE — Telephone Encounter (Signed)
Lortab rx called to [facility]. 

## 2011-11-08 NOTE — Telephone Encounter (Signed)
Will refill this time but Dr. Gilford Rile must document the need for this med and write a "FYI" for the record.

## 2011-11-20 ENCOUNTER — Other Ambulatory Visit: Payer: Self-pay | Admitting: Internal Medicine

## 2011-11-29 ENCOUNTER — Other Ambulatory Visit: Payer: Self-pay | Admitting: Internal Medicine

## 2011-12-09 ENCOUNTER — Other Ambulatory Visit: Payer: Self-pay | Admitting: *Deleted

## 2011-12-09 DIAGNOSIS — M1610 Unilateral primary osteoarthritis, unspecified hip: Secondary | ICD-10-CM

## 2011-12-09 NOTE — Telephone Encounter (Signed)
Denied. See 11/09/11 refill request.

## 2011-12-09 NOTE — Telephone Encounter (Signed)
Pharmacy made awared of denial. 

## 2011-12-10 ENCOUNTER — Encounter: Payer: PRIVATE HEALTH INSURANCE | Admitting: Internal Medicine

## 2011-12-10 ENCOUNTER — Encounter: Payer: Self-pay | Admitting: Internal Medicine

## 2011-12-10 ENCOUNTER — Ambulatory Visit (INDEPENDENT_AMBULATORY_CARE_PROVIDER_SITE_OTHER): Payer: PRIVATE HEALTH INSURANCE | Admitting: Internal Medicine

## 2011-12-10 VITALS — BP 139/77 | HR 74 | Temp 97.1°F | Ht 73.0 in | Wt 205.1 lb

## 2011-12-10 DIAGNOSIS — M1610 Unilateral primary osteoarthritis, unspecified hip: Secondary | ICD-10-CM

## 2011-12-10 MED ORDER — HYDROCODONE-ACETAMINOPHEN 7.5-500 MG PO TABS: 1.0000 | ORAL_TABLET | Freq: Four times a day (QID) | ORAL | Status: DC | PRN

## 2011-12-10 NOTE — Progress Notes (Signed)
Patient ID: [name], male   DOB: 1946-01-30, 66 y.o.   MRN: [id number]  66 y/o m with pmh listed below comes for vicodin refill See a/p for details.  No other complaints    General Appearance:     Filed Vitals:   12/10/11 1550  BP: 139/77  Pulse: 74  Temp: 97.1 F (36.2 C)  TempSrc: Oral  Height: 6\' 1"  (1.854 m)  Weight: 205 lb 1.6 oz (93.033 kg)  SpO2: 95%     Alert, cooperative, no distress, appears stated age  Head:    Normocephalic, without obvious abnormality, atraumatic  Eyes:    PERRL, conjunctiva/corneas clear, EOM's intact, fundi    benign, both eyes       Neck:   Supple, symmetrical, trachea midline, no adenopathy;       thyroid:  No enlargement/tenderness/nodules; no carotid   bruit or JVD  Lungs:     Clear to auscultation bilaterally, respirations unlabored  Chest wall:    No tenderness or deformity  Heart:    Regular rate and rhythm, S1 and S2 normal, no murmur, rub   or gallop  Abdomen:     Soft, non-tender, bowel sounds active all four quadrants,    no masses, no organomegaly  Extremities:   Extremities normal, atraumatic, no cyanosis or edema  Pulses:   2+ and symmetric all extremities  Skin:   Skin color, texture, turgor normal, no rashes or lesions  Neurologic:  nonfocal grossly    ROS  Constitutional: Denies fever, chills, diaphoresis, appetite change and fatigue.  Respiratory: Denies SOB, DOE, cough, chest tightness,  and wheezing.   Cardiovascular: Denies chest pain, palpitations and leg swelling.  Gastrointestinal: Denies nausea, vomiting, abdominal pain, diarrhea, constipation, blood in stool and abdominal distention.  Skin: Denies pallor, rash and wound.  Neurological: Denies dizziness, light-headedness, numbness and headaches.

## 2011-12-10 NOTE — Assessment & Plan Note (Signed)
Has been on vicodin for >1 year. Xray negative. Occasional flares. Never been to orthopedic, sports medicines or PT per him. Tried diclofenac, ibuprofen, tylenol- but does not remember if it helps or not.  His pcp was planning to taper off his narcotics before he left.  I discussed with the patient about the abuse potential of narcotics and other side effect like sleepiness, constipation etc. He was not aware of all this. He is not sure if he has an addiction to this meds but he says his pain is mild to moderate when he does not take his meds.  I discussed alternative options like celebrex, mobic, naproxen, other NSAIDS. Also tramadol is an option for break thorugh pain. He also needs PT and probably joint injection at the [facility]. He wants to discuss these options with his new pcp which I think is reasonable. He will also talk to his wife about it and let us know.  Until then, I will give him #90 tabs of vicodin without refill. Please do not refill without continuing this conversation further. I have added an FYI on his chart as well.

## 2011-12-22 IMAGING — CT CT CHEST W/ CM
2 of 4 series · 15 of 36 positions shown, 18 images · IV contrast (agent unspecified)
Comparison: 01/27/2009 and 07/28/2008.

CLINICAL DATA: Lung cancer diagnosed 8008.  History of right lung
resection.  Shortness of breath.

CT CHEST WITH CONTRAST
TECHNIQUE: Multidetector CT imaging of the chest was performed
following the standard protocol during bolus administration of
intravenous contrast.
Contrast: 80 ml [id number]

[Series 2: chest with st · axial · 0.72mm/px · z∈[+1564,+1844]mm · 12 of 68 slices shown, 15 images]
[im 6/68  mediastinal]
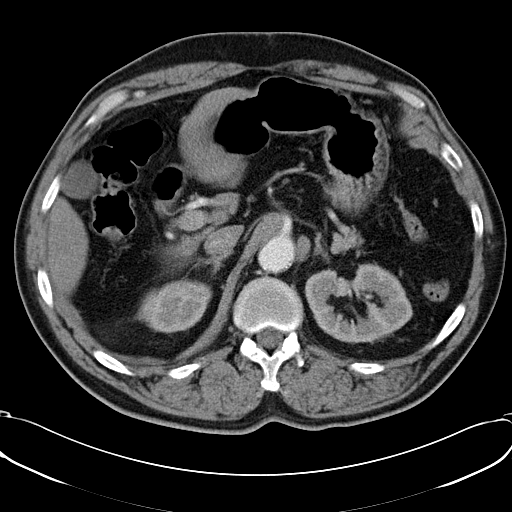
[im 6/68  lung]
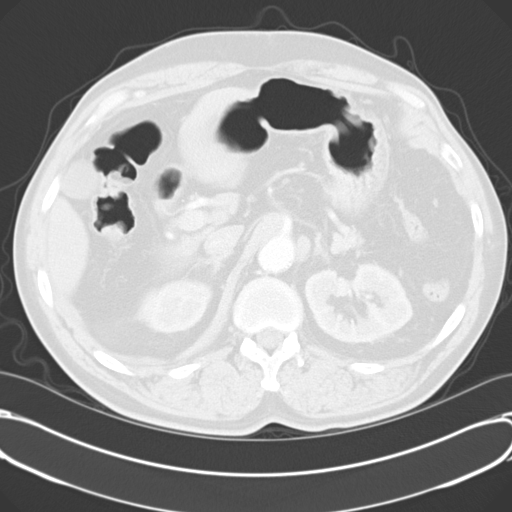
[im 11/68  lung]
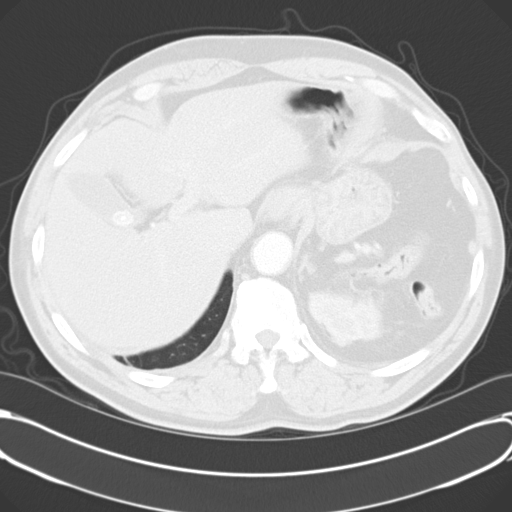
[im 16/68  lung]
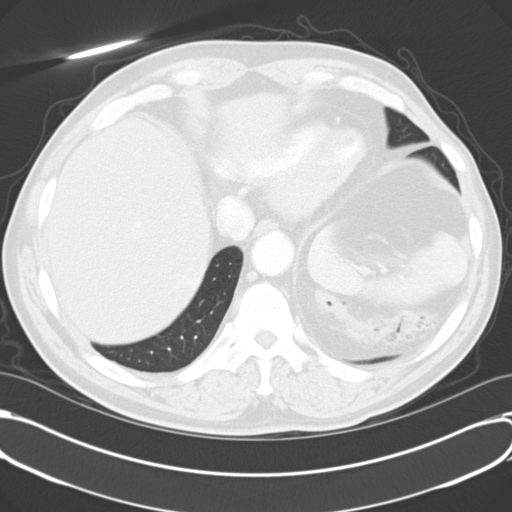
[im 21/68  lung]
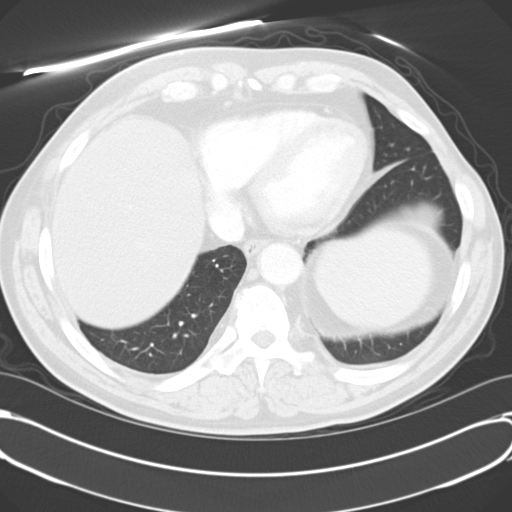
[im 26/68  mediastinal]
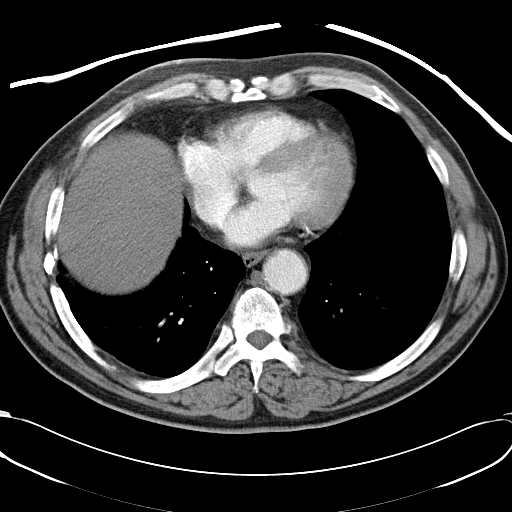
[im 26/68  lung]
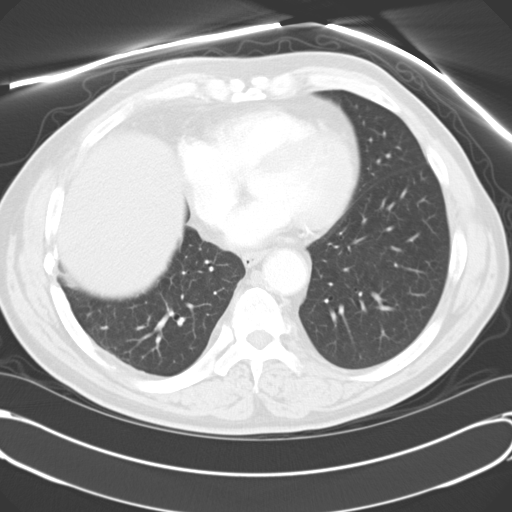
[im 31/68  lung]
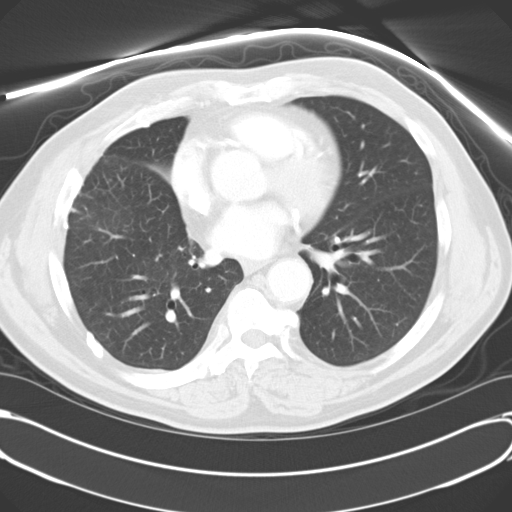
[im 37/68  lung]
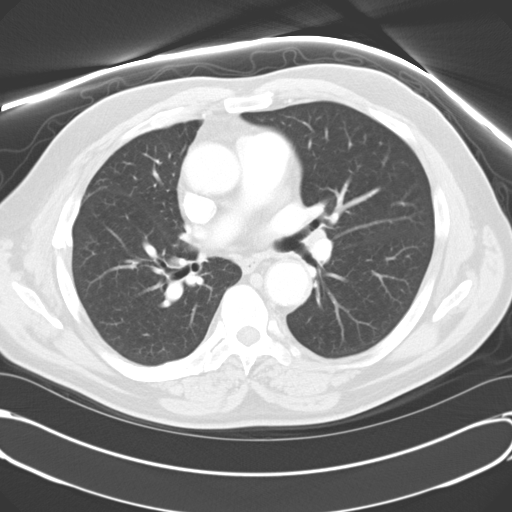
[im 42/68  lung]
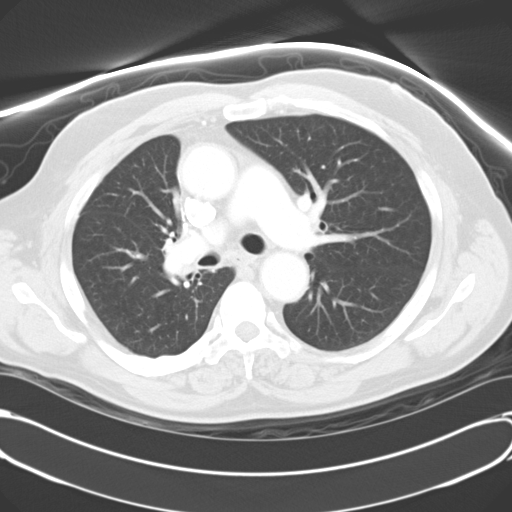
[im 47/68  mediastinal]
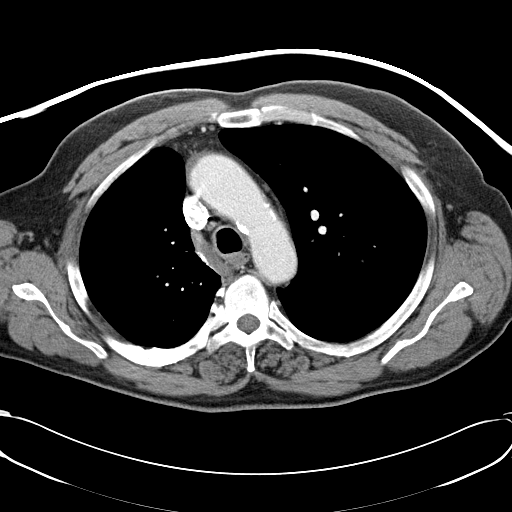
[im 47/68  lung]
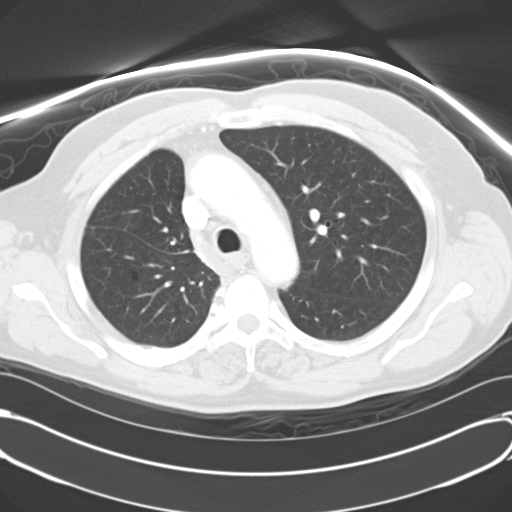
[im 52/68  lung]
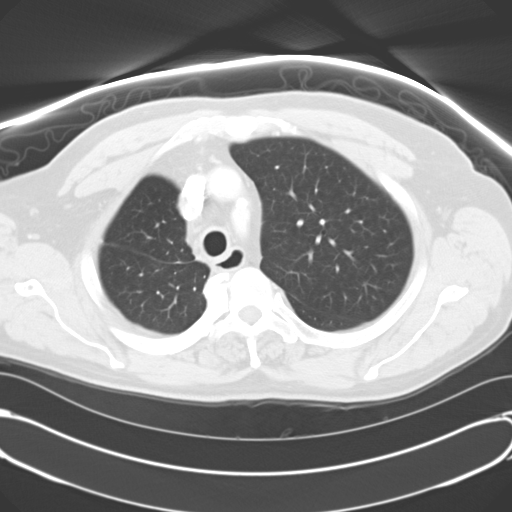
[im 57/68  lung]
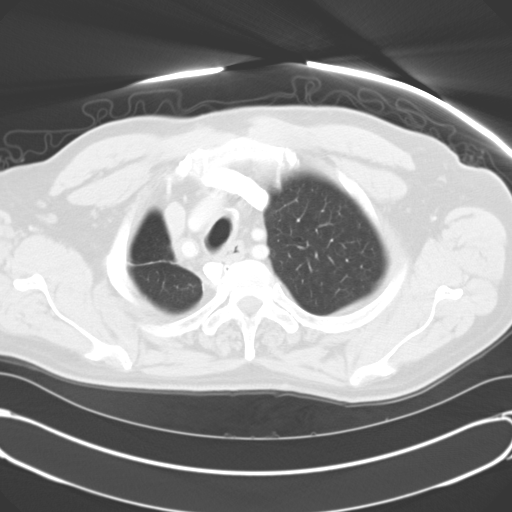
[im 62/68  lung]
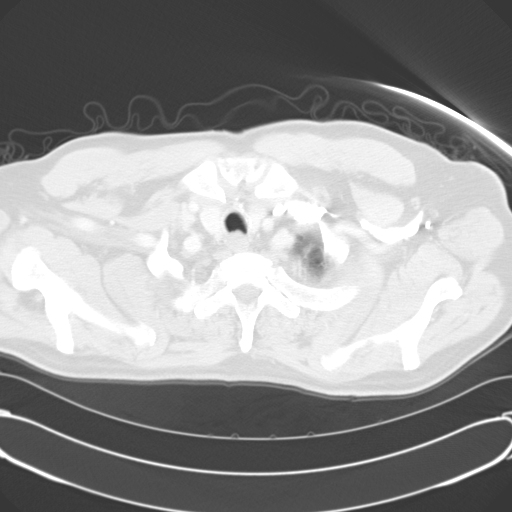

[Series 602: <mpr thick range> · coronal · 0.72mm/px · 3 of 94 slices shown]
[im 19/94  lung]
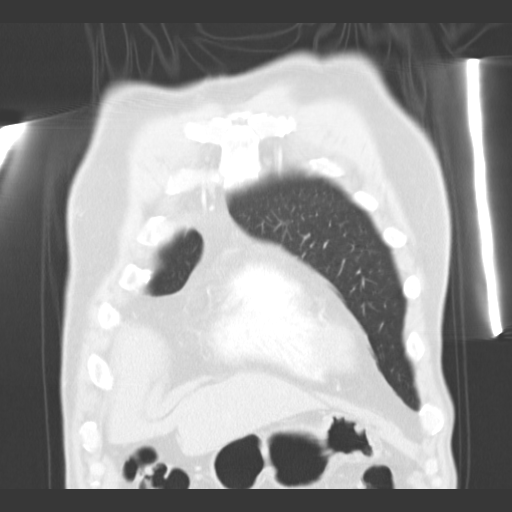
[im 38/94  lung]
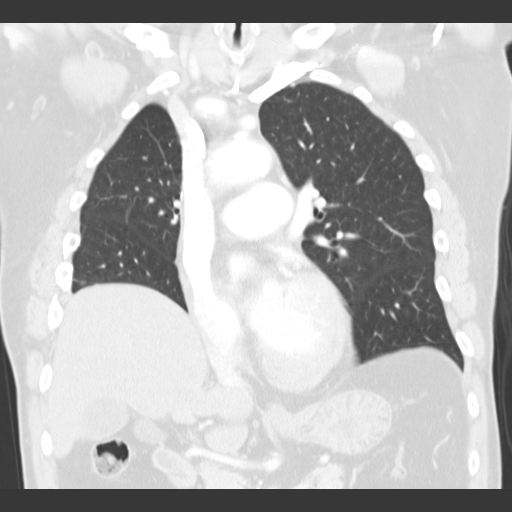
[im 56/94  lung]
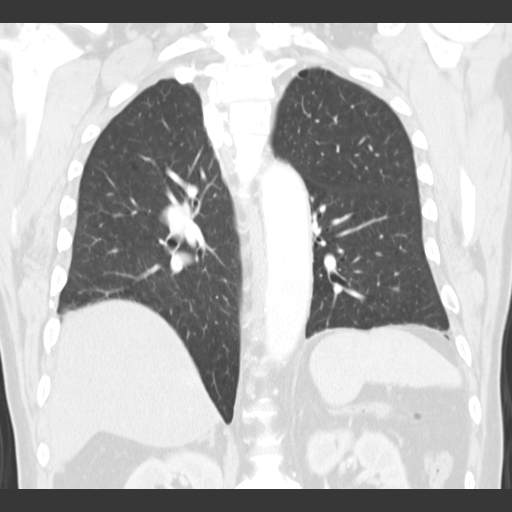

[15 of 36 positions shown; findings below may reference images not displayed]

FINDINGS: Lung windows demonstrate similar mild tracheal deviation
to the right.  Surgical changes, likely of right upper lobectomy.
Underlying mild centrilobular emphysema.
Linear scarring at the anterior right lung base which is unchanged.
Similar focal bullous disease at the left apex.

Soft tissue windows demonstrate a bovine arch, a normal anatomic
variant.

Normal heart size without pericardial or pleural effusion.
Multivessel coronary artery atherosclerosis.  This is age advanced.
No mediastinal or hilar adenopathy.

Limited abdominal imaging demonstrates  mild fatty infiltration of
the liver.  A probable perfusion anomaly or hemangioma in the
posterior right hepatic lobe on image 53 is similar on image 50 of
the prior exam.  A gallstone without acute cholecystitis or biliary
ductal dilatation.  Normal adrenal glands.  A left renal lesion
measures 1.9 cm and greater than fluid density on image 59.  On the
prior exam, this is similar or slightly larger at 2.1 cm.  Less
hyperattenuating on that exam.  Present back to 01/09/2007.
Numerous right-sided rib defects, likely postsurgical.
IMPRESSION: 1.  Stable appearance of surgical changes on the right without
evidence of recurrent or metastatic disease within the chest.
2.  Age advanced coronary artery atherosclerosis.
3.  Fatty infiltration of the liver.
4.  Cholelithiasis.
5.  A left renal lesion which measures similar or minimally smaller
in size but increased in density since the prior exam.  Favored to
represent a complex cyst (which has undergone interval hemorrhage).
If definitive characterization is desired, pre and post contrast
MRI suggested.  Alternatively, this could be reevaluated follow-up.
On follow-up chest CT, consider including pre and post contrast
abdominal CT.

## 2011-12-24 ENCOUNTER — Other Ambulatory Visit: Payer: Self-pay | Admitting: Internal Medicine

## 2012-01-02 ENCOUNTER — Other Ambulatory Visit: Payer: Self-pay | Admitting: Internal Medicine

## 2012-01-08 ENCOUNTER — Other Ambulatory Visit: Payer: Self-pay | Admitting: *Deleted

## 2012-01-08 DIAGNOSIS — M1610 Unilateral primary osteoarthritis, unspecified hip: Secondary | ICD-10-CM

## 2012-01-08 NOTE — Telephone Encounter (Signed)
Last refill 12/10/11.

## 2012-01-08 NOTE — Telephone Encounter (Signed)
Needs to come in to discuss further refills per last note.

## 2012-01-09 NOTE — Telephone Encounter (Signed)
Pharmacy called about med - was informed denied - needs appt - pharmacy will tell pt to make appt. Stanton Kidney Sorrel Cassetta RN 01/09/12 2PM

## 2012-01-10 ENCOUNTER — Encounter: Payer: Self-pay | Admitting: Internal Medicine

## 2012-01-10 ENCOUNTER — Telehealth: Payer: Self-pay | Admitting: Internal Medicine

## 2012-01-10 ENCOUNTER — Ambulatory Visit (INDEPENDENT_AMBULATORY_CARE_PROVIDER_SITE_OTHER): Payer: PRIVATE HEALTH INSURANCE | Admitting: Internal Medicine

## 2012-01-10 VITALS — BP 126/84 | HR 68 | Temp 97.1°F | Ht 73.5 in | Wt 203.9 lb

## 2012-01-10 DIAGNOSIS — R209 Unspecified disturbances of skin sensation: Secondary | ICD-10-CM

## 2012-01-10 DIAGNOSIS — Z720 Tobacco use: Secondary | ICD-10-CM

## 2012-01-10 DIAGNOSIS — I10 Essential (primary) hypertension: Secondary | ICD-10-CM

## 2012-01-10 DIAGNOSIS — F17200 Nicotine dependence, unspecified, uncomplicated: Secondary | ICD-10-CM

## 2012-01-10 DIAGNOSIS — D126 Benign neoplasm of colon, unspecified: Secondary | ICD-10-CM

## 2012-01-10 DIAGNOSIS — R208 Other disturbances of skin sensation: Secondary | ICD-10-CM

## 2012-01-10 DIAGNOSIS — C3490 Malignant neoplasm of unspecified part of unspecified bronchus or lung: Secondary | ICD-10-CM

## 2012-01-10 DIAGNOSIS — M1610 Unilateral primary osteoarthritis, unspecified hip: Secondary | ICD-10-CM

## 2012-01-10 DIAGNOSIS — D369 Benign neoplasm, unspecified site: Secondary | ICD-10-CM

## 2012-01-10 DIAGNOSIS — K2270 Barrett's esophagus without dysplasia: Secondary | ICD-10-CM

## 2012-01-10 DIAGNOSIS — Z85118 Personal history of other malignant neoplasm of bronchus and lung: Secondary | ICD-10-CM

## 2012-01-10 DIAGNOSIS — E785 Hyperlipidemia, unspecified: Secondary | ICD-10-CM

## 2012-01-10 DIAGNOSIS — M109 Gout, unspecified: Secondary | ICD-10-CM

## 2012-01-10 LAB — CBC WITH DIFFERENTIAL/PLATELET
Eosinophils Absolute: 0.3 10*3/uL (ref 0.0–0.7)
Eosinophils Relative: 3 % (ref 0–5)
HCT: 43 % (ref 39.0–52.0)
Lymphocytes Relative: 24 % (ref 12–46)
Lymphs Abs: 1.7 10*3/uL (ref 0.7–4.0)
MCH: 31 pg (ref 26.0–34.0)
MCV: 91.3 fL (ref 78.0–100.0)
Monocytes Absolute: 0.6 10*3/uL (ref 0.1–1.0)
Monocytes Relative: 8 % (ref 3–12)
Platelets: 179 10*3/uL (ref 150–400)
RBC: 4.71 MIL/uL (ref 4.22–5.81)
WBC: 7.3 10*3/uL (ref 4.0–10.5)

## 2012-01-10 MED ORDER — HYDROCODONE-ACETAMINOPHEN 7.5-500 MG PO TABS: 1.0000 | ORAL_TABLET | Freq: Three times a day (TID) | ORAL | Status: DC | PRN

## 2012-01-10 NOTE — Patient Instructions (Addendum)
Follow up 3-6 months, sooner if needed

## 2012-01-10 NOTE — Progress Notes (Signed)
  Subjective:    Patient ID: [name], male    DOB: 06/29/1945, 65 y.o.   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   Pt presents to clinic after [employer] representative [name]) came to his home 01/02/12 and noticed that he felt the monofilament exam b/l feet but did not feel the vibratory sensation.  It was recommended he f/u with his PCP.  Otherwise patient denies complaints but states he needs a Rx refill of his pain medication for his left hip pain.  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 Pt drinks 1 pt gin q week mixed with tap water (drinking since age 23)     Review of Systems  Constitutional: Negative for fever.  HENT:       Denies trouble swallowing   Respiratory: Negative for shortness of breath.   Cardiovascular: Negative for chest pain and leg swelling.  Gastrointestinal:       Denies ab pain  Musculoskeletal: Positive for arthralgias. Negative for gait problem.       Chronic left hip pain  Neurological: Negative for numbness.       Pt denies gait abnormality (uses a cane intermittently for left hip pain)       Objective:   Physical Exam  Nursing note and vitals reviewed. Constitutional: He is oriented to person, place, and time. Vital signs are normal. He appears well-developed and well-nourished. He is cooperative. No distress.       Pleasant   HENT:  Head: Normocephalic and atraumatic.  Mouth/Throat: Oropharynx is clear and moist and mucous membranes are normal. He has dentures. No oropharyngeal exudate.  Eyes: Pupils are equal, round, and reactive to light. Right conjunctiva is injected. Left conjunctiva is  injected. No scleral icterus.  Cardiovascular: Normal rate, regular rhythm, S1 normal, S2 normal and normal heart sounds.   No murmur heard. Pulmonary/Chest: Effort normal and breath sounds normal. No respiratory distress. He has no wheezes.      Abdominal: Soft. Bowel sounds are normal. He exhibits no distension. There is no tenderness.    Musculoskeletal:       No edema b/l lower ext   Neurological: He is alert and oriented to person, place, and time. He has normal strength. No sensory deficit. Gait normal.       5/5 strength upper and lower ext b/l  Nl position sense b/l hands (w eyes closed)  Nl sensation b/l feet with tuning fork and monofilament exams  Skin: Skin is warm, dry and intact. No rash noted. He is not diaphoretic.  Psychiatric: He has a normal mood and affect. His speech is normal and behavior is normal.          Assessment & Plan:  Follow up 3-6 months with PCP

## 2012-01-10 NOTE — Assessment & Plan Note (Signed)
Noted on home exam 01/02/12 and told to f/u with PCP Sensation b/l feet wnl today, no loss  Nl position sense b/l hands today as well as wnl sensation with tuning fork and monofilament exam today  Etiology could be idiopathic vs related to alcohol consumption vs other  Plan Check tsh, bmp, cbc, rpr, b12, folate

## 2012-01-10 NOTE — Assessment & Plan Note (Signed)
Neg CT chest 01/2011 Pt is following with h/o every year now  Next appt 01/27/12 with Dr. Rowan Blase Long  Pt still active smoker disc consequences but at this time he does not wish to quit

## 2012-01-10 NOTE — Assessment & Plan Note (Signed)
Counseled on the consequences Pt still active qd smoker He does not wish to quit at this time, previously failed Chantix

## 2012-01-10 NOTE — Assessment & Plan Note (Addendum)
BP 126/84-well controlled  Cont anti-HTN meds (Norvasc, Lasix, Lopressor) Pt will schedule appt with eye MD in 2013

## 2012-01-10 NOTE — Assessment & Plan Note (Addendum)
No active flare in 1.5 years When pt gets flare he takes colchicine and indomethacin with relief

## 2012-01-10 NOTE — Assessment & Plan Note (Addendum)
Repeat lipid panel today  Cont Crestor  Last lipid panel borderline goal  Lipid Panel     Component Value Date/Time   CHOL 153 10/12/2010 0958   TRIG 42 10/12/2010 0958   HDL 46 10/12/2010 0958   CHOLHDL 3.3 10/12/2010 0958   VLDL 8 10/12/2010 0958   LDLCALC 99 10/12/2010 0958

## 2012-01-10 NOTE — Telephone Encounter (Signed)
S/w pt today re appts for lb/ct 8/12 and MM 8/14. Mailed schedule.

## 2012-01-10 NOTE — Assessment & Plan Note (Signed)
Noted 2008 colonoscopy as well as internal hemorrhoids Schedule pt GI referral for up to date colonoscopy. He is established pt

## 2012-01-11 ENCOUNTER — Encounter: Payer: Self-pay | Admitting: Internal Medicine

## 2012-01-11 LAB — BASIC METABOLIC PANEL WITH GFR
BUN: 18 mg/dL (ref 6–23)
Calcium: 9.6 mg/dL (ref 8.4–10.5)
GFR, Est African American: 70 mL/min
Glucose, Bld: 89 mg/dL (ref 70–99)

## 2012-01-11 LAB — VITAMIN B12: Vitamin B-12: 350 pg/mL (ref 211–911)

## 2012-01-11 LAB — LIPID PANEL: LDL Cholesterol: 100 mg/dL — ABNORMAL HIGH (ref 0–99)

## 2012-01-11 LAB — TSH: TSH: 1.848 u[IU]/mL (ref 0.350–4.500)

## 2012-01-13 NOTE — Progress Notes (Signed)
Agree with plan 

## 2012-01-24 ENCOUNTER — Other Ambulatory Visit: Payer: Self-pay | Admitting: Internal Medicine

## 2012-01-27 ENCOUNTER — Other Ambulatory Visit (HOSPITAL_BASED_OUTPATIENT_CLINIC_OR_DEPARTMENT_OTHER): Payer: PRIVATE HEALTH INSURANCE | Admitting: Lab

## 2012-01-27 ENCOUNTER — Encounter (HOSPITAL_COMMUNITY): Payer: Self-pay

## 2012-01-27 ENCOUNTER — Ambulatory Visit (HOSPITAL_COMMUNITY)
Admission: RE | Admit: 2012-01-27 | Discharge: 2012-01-27 | Disposition: A | Discharge status: Home / Self Care | Payer: PRIVATE HEALTH INSURANCE | Source: Ambulatory Visit | Attending: Internal Medicine | Admitting: Internal Medicine

## 2012-01-27 DIAGNOSIS — I2789 Other specified pulmonary heart diseases: Secondary | ICD-10-CM | POA: Insufficient documentation

## 2012-01-27 DIAGNOSIS — C3490 Malignant neoplasm of unspecified part of unspecified bronchus or lung: Secondary | ICD-10-CM

## 2012-01-27 DIAGNOSIS — C3410 Malignant neoplasm of upper lobe, unspecified bronchus or lung: Secondary | ICD-10-CM

## 2012-01-27 DIAGNOSIS — J438 Other emphysema: Secondary | ICD-10-CM | POA: Insufficient documentation

## 2012-01-27 DIAGNOSIS — K8020 Calculus of gallbladder without cholecystitis without obstruction: Secondary | ICD-10-CM | POA: Insufficient documentation

## 2012-01-27 DIAGNOSIS — N289 Disorder of kidney and ureter, unspecified: Secondary | ICD-10-CM | POA: Insufficient documentation

## 2012-01-27 DIAGNOSIS — R0602 Shortness of breath: Secondary | ICD-10-CM | POA: Insufficient documentation

## 2012-01-27 DIAGNOSIS — I712 Thoracic aortic aneurysm, without rupture, unspecified: Secondary | ICD-10-CM | POA: Insufficient documentation

## 2012-01-27 DIAGNOSIS — I2510 Atherosclerotic heart disease of native coronary artery without angina pectoris: Secondary | ICD-10-CM | POA: Insufficient documentation

## 2012-01-27 LAB — CBC WITH DIFFERENTIAL/PLATELET
Basophils Absolute: 0.1 10*3/uL (ref 0.0–0.1)
EOS%: 4.5 % (ref 0.0–7.0)
Eosinophils Absolute: 0.3 10*3/uL (ref 0.0–0.5)
HCT: 40.8 % (ref 38.4–49.9)
HGB: 13.5 g/dL (ref 13.0–17.1)
MCH: 31.9 pg (ref 27.2–33.4)
MCV: 96.2 fL (ref 79.3–98.0)
MONO%: 5.2 % (ref 0.0–14.0)
NEUT#: 5.2 10*3/uL (ref 1.5–6.5)
NEUT%: 71.5 % (ref 39.0–75.0)
Platelets: 157 10*3/uL (ref 140–400)
RDW: 15.6 % — ABNORMAL HIGH (ref 11.0–14.6)

## 2012-01-27 LAB — COMPREHENSIVE METABOLIC PANEL
AST: 21 U/L (ref 0–37)
Albumin: 4.1 g/dL (ref 3.5–5.2)
Alkaline Phosphatase: 82 U/L (ref 39–117)
BUN: 15 mg/dL (ref 6–23)
Calcium: 9.1 mg/dL (ref 8.4–10.5)
Creatinine, Ser: 1.34 mg/dL (ref 0.50–1.35)
Glucose, Bld: 102 mg/dL — ABNORMAL HIGH (ref 70–99)
Potassium: 3.8 mEq/L (ref 3.5–5.3)

## 2012-01-27 NOTE — Telephone Encounter (Signed)
Pt has been informed and will make appointment. 

## 2012-01-27 NOTE — Telephone Encounter (Signed)
Will not refill this medication at this time.  Patient has an outstanding prescription for indomethacin and also this recurring prescription for diclofenac, both are NSAIDs.  It is unclear, also, if he is taking anything over the counter which could include NSAID pain relievers.  The patient will need to be seen in clinic to discuss his pain control regimen and other options as noted in his IM clinic visit from the end of June. It looks like he was seen in July, but his regimen wasn't fully addressed.

## 2012-01-29 ENCOUNTER — Telehealth: Payer: Self-pay | Admitting: Internal Medicine

## 2012-01-29 ENCOUNTER — Ambulatory Visit (HOSPITAL_BASED_OUTPATIENT_CLINIC_OR_DEPARTMENT_OTHER): Payer: PRIVATE HEALTH INSURANCE | Admitting: Internal Medicine

## 2012-01-29 VITALS — BP 124/69 | HR 77 | Temp 97.4°F | Resp 20 | Ht 73.5 in | Wt 202.3 lb

## 2012-01-29 DIAGNOSIS — C3410 Malignant neoplasm of upper lobe, unspecified bronchus or lung: Secondary | ICD-10-CM

## 2012-01-29 DIAGNOSIS — C3490 Malignant neoplasm of unspecified part of unspecified bronchus or lung: Secondary | ICD-10-CM

## 2012-01-29 NOTE — Telephone Encounter (Signed)
gv pt appt schedule for August 2014 including ct for 01/22/2013.

## 2012-01-29 NOTE — Progress Notes (Signed)
[facility] [phone]   [phone]  OFFICE PROGRESS NOTE  Debe Coder, MD [address] [address]  DIAGNOSIS: Stage IB (T2, N0, M0) non-small cell lung cancer diagnosed in November of 2007  PRIOR THERAPY: Status post right upper lobectomy with mediastinal lymph node dissection under the care of Dr. Dorris Fetch on 04/28/2006. The tumor size was 8 CM but the patient refused adjuvant chemotherapy at that time.  CURRENT THERAPY: Observation.  INTERVAL HISTORY: [name] 66 y.o. male returns to the clinic today for annual followup visit. The patient is doing fine today with no specific complaints. He denied having any significant chest pain, shortness breath, cough or hemoptysis. He denied having any significant weight loss or night sweats. The patient has repeat CT scan of the chest performed recently and he is here today for evaluation and discussion of his scan results.  MEDICAL HISTORY: Past Medical History  Diagnosis Date  . Diverticulosis of colon   . GERD (gastroesophageal reflux disease)   . Gout   . Hypertension   . Hyperlipidemia   . Renal failure, acute     hx of 2/2 medication, resolved  . Barretts esophagus     bx distal esophagus 2011 neg   . Tobacco abuse   . Alcohol abuse   . Hx of adenomatous colonic polyps   . CHF (congestive heart failure)   . Hiatal hernia     2 cm noted on upper endos 2011   . Cataract   . Internal hemorrhoid   . DJD (degenerative joint disease)     left hip  . Myocardial infarction     hx of, 2 stents 2004. pt was on Plavix x 3 years then transitioned to ASA  . 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  MEDICATIONS:  Current Outpatient Prescriptions  Medication Sig Dispense Refill  . amLODipine (NORVASC) 10 MG tablet TAKE 1 TABLET (10 MG TOTAL) BY MOUTH  DAILY.  30 tablet  3  . aspirin EC 81 MG tablet Take 1 tablet (81 mg total) by mouth daily.  150 tablet  2  . colchicine 0.6 MG tablet Take 1 tablet (0.6 mg total) by mouth 2 (two) times daily as needed. For gout flare up  30 tablet  3  . CRESTOR 20 MG tablet TAKE 1 TABLET (20 MG TOTAL) BY MOUTH DAILY.  30 tablet  3  . diclofenac (VOLTAREN) 75 MG EC tablet TAKE 1 TABLET (75 MG TOTAL) BY MOUTH 2 (TWO) TIMES DAILY.  60 tablet  0  . furosemide (LASIX) 20 MG tablet TAKE 1 TABLET (20 MG TOTAL) BY MOUTH DAILY.  30 tablet  1  . HYDROcodone-acetaminophen (LORTAB) 7.5-500 MG per tablet Take 1 tablet by mouth every 8 (eight) hours as needed. For pain  90 tablet  5  . indomethacin (INDOCIN) 25 MG capsule Take 1-2 capsules (25-50 mg total) by mouth 3 (three) times daily as needed. For gout pain  60 capsule  3  . metoprolol (LOPRESSOR) 50 MG tablet TAKE 1/2 TABLET BY MOUTH TWICE DAILY  31 tablet  5  . omeprazole (PRILOSEC) 20 MG capsule TAKE 1 CAPSULE (20 MG TOTAL) BY MOUTH 2 (TWO) TIMES DAILY.  60 capsule  1    SURGICAL HISTORY:  Past Surgical History  Procedure Date  . Lung lobectomy 11/07  RUL  . Flexible cystourethroscopy 05/24/06  . Other surgical history     hernia repair 2005   . Hernia repair     REVIEW OF SYSTEMS:  A comprehensive review of systems was negative.   PHYSICAL EXAMINATION: General appearance: alert, cooperative and no distress Head: Normocephalic, without obvious abnormality, atraumatic Neck: no adenopathy Lymph nodes: Cervical, supraclavicular, and axillary nodes normal. Resp: clear to auscultation bilaterally Cardio: regular rate and rhythm, S1, S2 normal, no murmur, click, rub or gallop GI: soft, non-tender; bowel sounds normal; no masses,  no organomegaly Extremities: extremities normal, atraumatic, no cyanosis or edema Neurologic: Alert and oriented X 3, normal strength and tone. Normal symmetric reflexes. Normal coordination and gait  ECOG PERFORMANCE STATUS: 0 -  Asymptomatic  Blood pressure 124/69, pulse 77, temperature 97.4 F (36.3 C), temperature source Oral, resp. rate 20, height 6' 1.5" (1.867 m), weight 202 lb 4.8 oz (91.763 kg).  LABORATORY DATA: Lab Results  Component Value Date   WBC 7.2 01/27/2012   HGB 13.5 01/27/2012   HCT 40.8 01/27/2012   MCV 96.2 01/27/2012   PLT 157 01/27/2012      Chemistry      Component Value Date/Time   NA 139 01/27/2012 0827   NA 143 01/25/2011 0901   K 3.8 01/27/2012 0827   K 4.2 01/25/2011 0901   CL 103 01/27/2012 0827   CL 101 01/25/2011 0901   CO2 27 01/27/2012 0827   CO2 26 01/25/2011 0901   BUN 15 01/27/2012 0827   BUN 15 01/25/2011 0901   CREATININE 1.34 01/27/2012 0827   CREATININE 1.24 01/10/2012 1643      Component Value Date/Time   CALCIUM 9.1 01/27/2012 0827   CALCIUM 9.1 01/25/2011 0901   ALKPHOS 82 01/27/2012 0827   ALKPHOS 93* 01/25/2011 0901   AST 21 01/27/2012 0827   AST 35 01/25/2011 0901   ALT 14 01/27/2012 0827   BILITOT 0.7 01/27/2012 0827   BILITOT 0.60 01/25/2011 0901       RADIOGRAPHIC STUDIES: Ct Chest Wo Contrast  01/27/2012  *RADIOLOGY REPORT*  Clinical Data: Lung cancer.  Shortness of breath.  CT CHEST WITHOUT CONTRAST  Technique:  Multidetector CT imaging of the chest was performed following the standard protocol without IV contrast.  Comparison: 01/25/2011 and CT chest abdomen pelvis 01/29/2010.  Findings: No pathologically enlarged mediastinal or axillary lymph nodes.  Hilar regions are difficult to definitively evaluate without IV contrast.  Atherosclerotic calcification of the arterial vasculature, including coronary arteries. Ascending aorta measures 4.2 cm.  Heart size normal.  No pericardial effusion.  Pulmonary arteries are enlarged.  Postoperative changes of right upper lobectomy, stable. Mild centrilobular emphysema.  Lungs are otherwise clear.  No pleural fluid. Mild narrowing of the right middle lobe bronchus, stable. Airway is otherwise unremarkable.  Incidental imaging of  the upper abdomen shows cholelithiasis. There are high and low attenuation lesions in the kidneys.  Low attenuation lesions measure up to 2.2 cm on the right.  High attenuation lesion measures 4.9 cm on the left.  Sizes appear stable from prior exams. No worrisome lytic or sclerotic lesions. Thoracotomy changes on the right.  Degenerative changes in the spine.  Osseous bridging is seen between the right first and second ribs.  IMPRESSION:  1.  Postoperative changes of right upper lobectomy without evidence of recurrent or metastatic disease. 2.  Small ascending aortic aneurysm, pulmonary arterial hypertension and coronary artery calcification. 3.  Cholelithiasis.  Original Report Authenticated By: Reyes Ivan,  M.D.    ASSESSMENT: This is a very pleasant 66 years old African American male with history of stage IB non-small cell lung cancer status post right upper lobectomy with lymph node dissection and he has been observation since November of 2007 was no evidence for disease recurrence.  PLAN: I discussed the scan results with the patient. I recommended for him continuous observation for now with repeat CT scan of the chest without contrast in one year. He was advised to call me immediately if he has any concerning symptoms in the interval..  All questions were answered. The patient knows to call the clinic with any problems, questions or concerns. We can certainly see the patient much sooner if necessary.

## 2012-02-05 ENCOUNTER — Other Ambulatory Visit: Payer: Self-pay | Admitting: Internal Medicine

## 2012-02-05 DIAGNOSIS — I10 Essential (primary) hypertension: Secondary | ICD-10-CM

## 2012-02-13 ENCOUNTER — Ambulatory Visit (AMBULATORY_SURGERY_CENTER): Payer: PRIVATE HEALTH INSURANCE | Admitting: *Deleted

## 2012-02-13 VITALS — Ht 73.5 in | Wt 205.2 lb

## 2012-02-13 DIAGNOSIS — Z86010 Personal history of colonic polyps: Secondary | ICD-10-CM

## 2012-02-13 DIAGNOSIS — Z1211 Encounter for screening for malignant neoplasm of colon: Secondary | ICD-10-CM

## 2012-02-13 MED ORDER — NA SULFATE-K SULFATE-MG SULF 17.5-3.13-1.6 GM/177ML PO SOLN: 1.0000 | Freq: Once | ORAL | Status: DC

## 2012-02-27 ENCOUNTER — Encounter: Payer: PRIVATE HEALTH INSURANCE | Admitting: Internal Medicine

## 2012-02-27 ENCOUNTER — Ambulatory Visit (AMBULATORY_SURGERY_CENTER): Payer: PRIVATE HEALTH INSURANCE | Admitting: Internal Medicine

## 2012-02-27 ENCOUNTER — Encounter: Payer: Self-pay | Admitting: Internal Medicine

## 2012-02-27 VITALS — BP 131/76 | HR 79 | Temp 97.6°F | Resp 16 | Ht 73.5 in | Wt 205.0 lb

## 2012-02-27 DIAGNOSIS — Z1211 Encounter for screening for malignant neoplasm of colon: Secondary | ICD-10-CM

## 2012-02-27 DIAGNOSIS — D126 Benign neoplasm of colon, unspecified: Secondary | ICD-10-CM

## 2012-02-27 DIAGNOSIS — Z86010 Personal history of colonic polyps: Secondary | ICD-10-CM

## 2012-02-27 DIAGNOSIS — K635 Polyp of colon: Secondary | ICD-10-CM

## 2012-02-27 MED ORDER — SODIUM CHLORIDE 0.9 % IV SOLN: 500.0000 mL | INTRAVENOUS | Status: DC

## 2012-02-27 NOTE — Op Note (Signed)
[facility] [address] [place], [address]   COLONOSCOPY PROCEDURE REPORT  PATIENT: [name], [name]  MR#: [id number] BIRTHDATE: [birth year] , 66  yrs. old GENDER: Male ENDOSCOPIST: Iva Boop, MD, [facility] REFERRED BY: PROCEDURE DATE:  02/27/2012 PROCEDURE:   Colonoscopy with biopsy ASA CLASS:   Class III INDICATIONS:screening and surveillance,personal history of colonic polyps and patient's personal history of adenomatous colon polyps.  MEDICATIONS: propofol (Diprivan) 200mg  IV, MAC sedation, administered by CRNA, and These medications were titrated to patient response per physician's verbal order  DESCRIPTION OF PROCEDURE:   After the risks benefits and alternatives of the procedure were thoroughly explained, informed consent was obtained.  A digital rectal exam revealed no abnormalities of the rectum and A digital rectal exam revealed the prostate was not enlarged.   The LB [id number]  endoscope was introduced through the anus and advanced to the cecum, which was identified by both the appendix and ileocecal valve. No adverse events experienced.   The quality of the prep was Suprep good  The instrument was then slowly withdrawn as the colon was fully examined.      COLON FINDINGS: A polypoid shaped sessile polyp measuring 2 mm in size was found at the cecum.  A biopsy was performed using cold forceps.   Small internal hemorrhoids were found.   The colon mucosa was otherwise normal.  Retroflexed views revealed internal hemorrhoids. The time to cecum=2 minutes 25 seconds.  Withdrawal time=8 minutes 05 seconds.  The scope was withdrawn and the procedure completed. COMPLICATIONS: There were no complications.  ENDOSCOPIC IMPRESSION: 1.   Sessile polyp measuring 2 mm in size was found at the cecum; biopsy was performed using cold forceps 2.   Small internal hemorrhoids 3.   The colon mucosa was otherwise normal good prep 4.   Personal hx 3  adenomas largest 12 mm 2006  RECOMMENDATIONS: repeat Colonoscopy in 5 years.   eSigned:  Iva Boop, MD, [facility] 02/27/2012 3:18 PM   cc: The Patient    and Dr. Sheralyn Boatman

## 2012-02-27 NOTE — Progress Notes (Signed)
Patient did not experience any of the following events: a burn prior to discharge; a fall within the facility; wrong site/side/patient/procedure/implant event; or a hospital transfer or hospital admission upon discharge from the facility. (G8907) Patient did not have preoperative order for IV antibiotic SSI prophylaxis. (G8918)  

## 2012-02-27 NOTE — Patient Instructions (Addendum)
One tiny polyp was removed. No cancer seen. You will probably be told to have a colonoscopy in about 5 years.  Thank you for choosing me and [facility].  Iva Boop, MD, FACG   YOU HAD AN ENDOSCOPIC PROCEDURE TODAY AT THE [facility]: Refer to the procedure report that was given to you for any specific questions about what was found during the examination.  If the procedure report does not answer your questions, please call your gastroenterologist to clarify.  If you requested that your care partner not be given the details of your procedure findings, then the procedure report has been included in a sealed envelope for you to review at your convenience later.  YOU SHOULD EXPECT: Some feelings of bloating in the abdomen. Passage of more gas than usual.  Walking can help get rid of the air that was put into your GI tract during the procedure and reduce the bloating. If you had a lower endoscopy (such as a colonoscopy or flexible sigmoidoscopy) you may notice spotting of blood in your stool or on the toilet paper. If you underwent a bowel prep for your procedure, then you may not have a normal bowel movement for a few days.  DIET: Your first meal following the procedure should be a light meal and then it is ok to progress to your normal diet.  A half-sandwich or bowl of soup is an example of a good first meal.  Heavy or fried foods are harder to digest and may make you feel nauseous or bloated.  Likewise meals heavy in dairy and vegetables can cause extra gas to form and this can also increase the bloating.  Drink plenty of fluids but you should avoid alcoholic beverages for 24 hours.  ACTIVITY: Your care partner should take you home directly after the procedure.  You should plan to take it easy, moving slowly for the rest of the day.  You can resume normal activity the day after the procedure however you should NOT DRIVE or use heavy machinery for 24 hours (because of the  sedation medicines used during the test).    SYMPTOMS TO REPORT IMMEDIATELY: A gastroenterologist can be reached at any hour.  During normal business hours, 8:30 AM to 5:00 PM Monday through Friday, call [phone].  After hours and on weekends, please call the GI answering service at [phone] who will take a message and have the physician on call contact you.   Following lower endoscopy (colonoscopy or flexible sigmoidoscopy):  Excessive amounts of blood in the stool  Significant tenderness or worsening of abdominal pains  Swelling of the abdomen that is new, acute  Fever of 100F or higher  FOLLOW UP: If any biopsies were taken you will be contacted by phone or by letter within the next 1-3 weeks.  Call your gastroenterologist if you have not heard about the biopsies in 3 weeks.  Our staff will call the home number listed on your records the next business day following your procedure to check on you and address any questions or concerns that you may have at that time regarding the information given to you following your procedure. This is a courtesy call and so if there is no answer at the home number and we have not heard from you through the emergency physician on call, we will assume that you have returned to your regular daily activities without incident.  SIGNATURES/CONFIDENTIALITY: You and/or your care partner have signed paperwork which will  be entered into your electronic medical record.  These signatures attest to the fact that that the information above on your After Visit Summary has been reviewed and is understood.  Full responsibility of the confidentiality of this discharge information lies with you and/or your care-partner.    Handout on polyps

## 2012-02-28 ENCOUNTER — Telehealth: Payer: Self-pay

## 2012-02-28 NOTE — Telephone Encounter (Signed)
  Follow up Call-  Call back number 02/27/2012  Post procedure Call Back phone  # [phone]  Permission to leave phone message Yes     Patient questions:  Do you have a fever, pain , or abdominal swelling? no Pain Score  0 *  Have you tolerated food without any problems? yes  Have you been able to return to your normal activities? yes  Do you have any questions about your discharge instructions: Diet   no Medications  no Follow up visit  no  Do you have questions or concerns about your Care? no  Actions: * If pain score is 4 or above: No action needed, pain <4.  No problems per the pt. Maw

## 2012-03-03 ENCOUNTER — Encounter: Payer: Self-pay | Admitting: Internal Medicine

## 2012-03-03 NOTE — Progress Notes (Signed)
Quick Note:  Diminutive adenoma Hx 3 adenomas max 12 mm 2006 Repeat colon about 02/2017 ______

## 2012-03-06 ENCOUNTER — Other Ambulatory Visit: Payer: Self-pay | Admitting: Internal Medicine

## 2012-03-06 NOTE — Telephone Encounter (Signed)
Needs Jan appt with Dr Shirlee Latch

## 2012-03-06 NOTE — Telephone Encounter (Signed)
Needs jan appt dr Shirlee Latch

## 2012-03-07 ENCOUNTER — Other Ambulatory Visit: Payer: Self-pay | Admitting: Internal Medicine

## 2012-03-11 ENCOUNTER — Other Ambulatory Visit: Payer: Self-pay | Admitting: Internal Medicine

## 2012-03-17 ENCOUNTER — Encounter: Payer: Self-pay | Admitting: Internal Medicine

## 2012-03-17 DIAGNOSIS — I2510 Atherosclerotic heart disease of native coronary artery without angina pectoris: Secondary | ICD-10-CM | POA: Insufficient documentation

## 2012-04-30 ENCOUNTER — Encounter: Payer: Self-pay | Admitting: Internal Medicine

## 2012-04-30 MED ORDER — HYDROCODONE-ACETAMINOPHEN 7.5-325 MG PO TABS: 1.0000 | ORAL_TABLET | Freq: Three times a day (TID) | ORAL | Status: DC | PRN

## 2012-04-30 NOTE — Addendum Note (Signed)
Addended by: Annett Gula on: 04/30/2012 01:21 PM   Modules accepted: Orders, Medications

## 2012-05-26 ENCOUNTER — Telehealth: Payer: Self-pay | Admitting: *Deleted

## 2012-05-26 NOTE — Telephone Encounter (Signed)
Call from pt needs to get a referral to Dr. Mitzi Davenport for an Opthalmology appointment.  Needs any day but 06/03/2012.  Angelina Ok, RN 05/26/2012 2:20 PM.

## 2012-05-26 NOTE — Addendum Note (Signed)
Addended by: Annett Gula on: 05/26/2012 07:10 PM   Modules accepted: Orders

## 2012-06-05 ENCOUNTER — Other Ambulatory Visit: Payer: Self-pay | Admitting: Internal Medicine

## 2012-06-15 ENCOUNTER — Other Ambulatory Visit: Payer: Self-pay | Admitting: Internal Medicine

## 2012-06-20 IMAGING — CT CT CHEST W/ CM
3 of 9 series · 12 of 46 positions shown, 18 images · IV contrast (omnipaque)
Comparison: CT 01/09/2007, CT thorax 08/01/2009

CT CHEST

CLINICAL DATA: Follow-up lung cancer.  The patient had right lung
resection.  Additional history of left renal lesion.  Original
diagnosis in 2009.

CT CHEST, ABDOMEN AND PELVIS WITH CONTRAST
TECHNIQUE: Multidetector CT imaging of the chest, abdomen and
pelvis was performed following the standard protocol during bolus
administration of intravenous contrast.
Contrast: 100 ml Omnipaque 300

[Series 2: renal w/o · axial · non-contrast · 0.78mm/px · z∈[-412,-358]mm · 2 of 54 slices shown]
[im 11/54  soft-tissue]
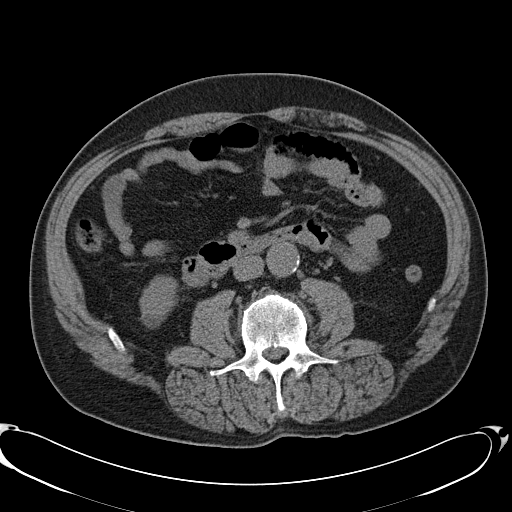
[im 22/54  soft-tissue]
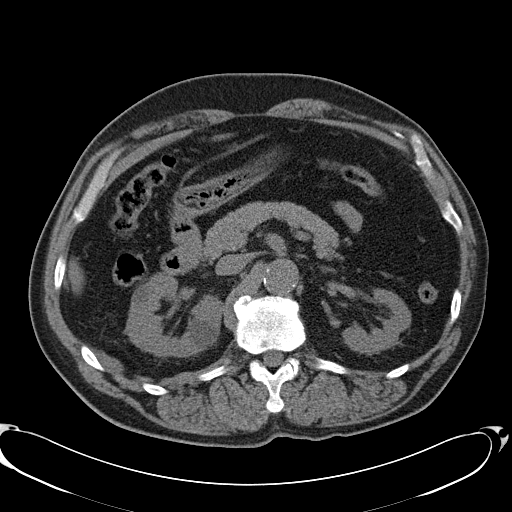

[Series 6: venous · axial · portal-venous · 0.78mm/px · z∈[-430,-96]mm · 7 of 91 slices shown, 12 images]
[im 12/91  soft-tissue]
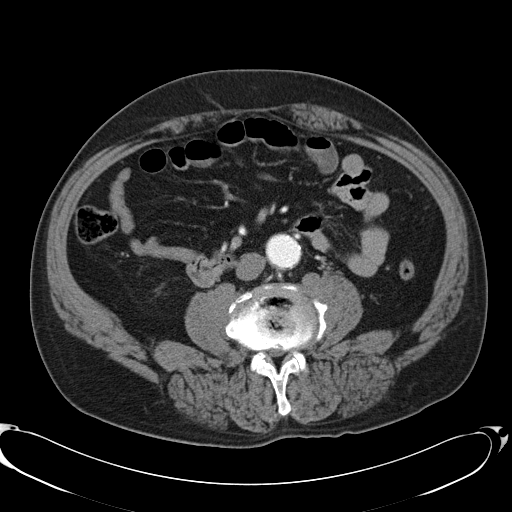
[im 12/91  bone]
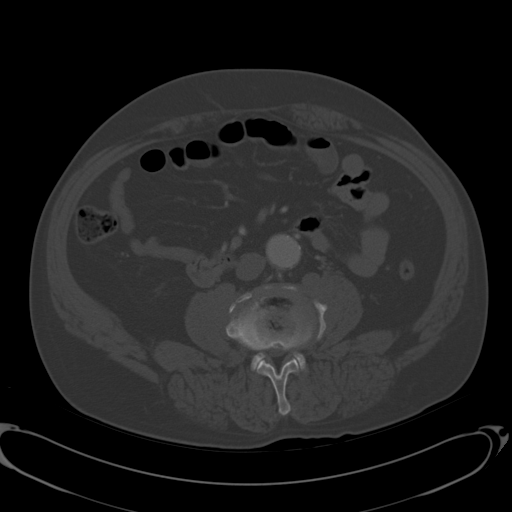
[im 23/91  soft-tissue]
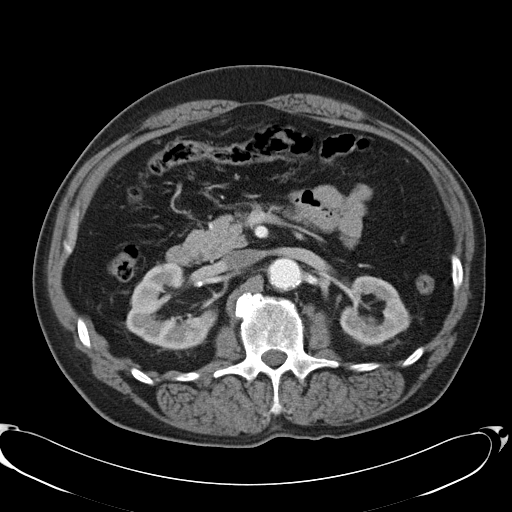
[im 34/91  soft-tissue]
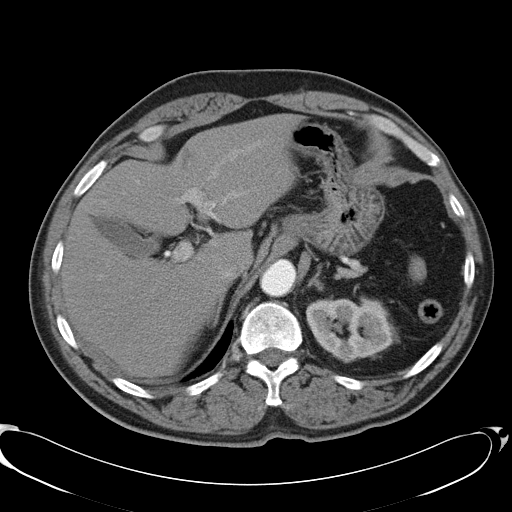
[im 46/91  soft-tissue]
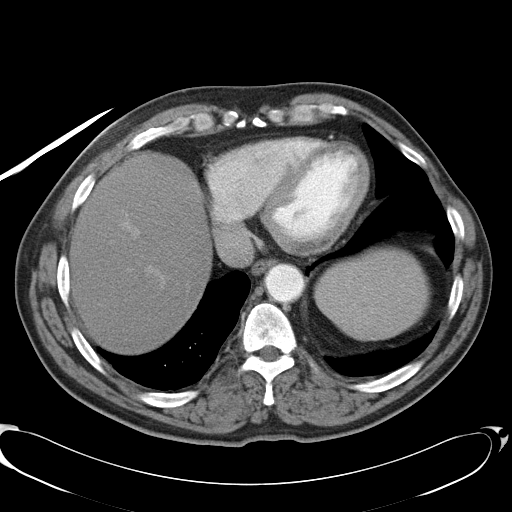
[im 46/91  lung]
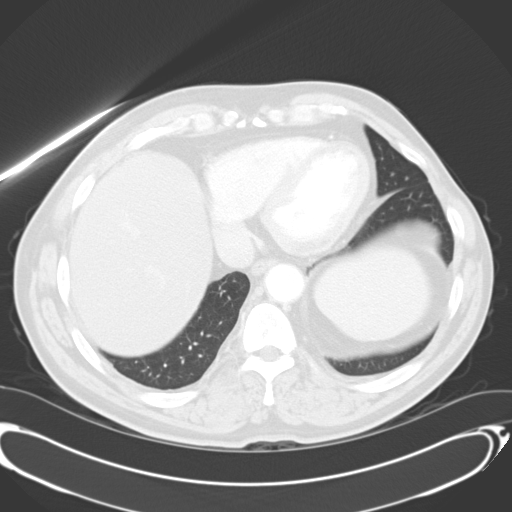
[im 57/91  soft-tissue]
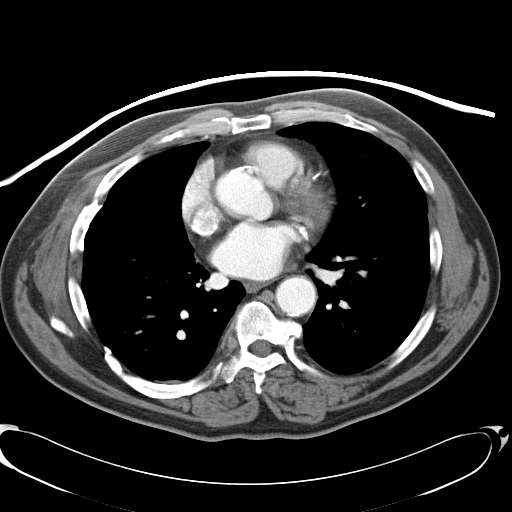
[im 57/91  lung]
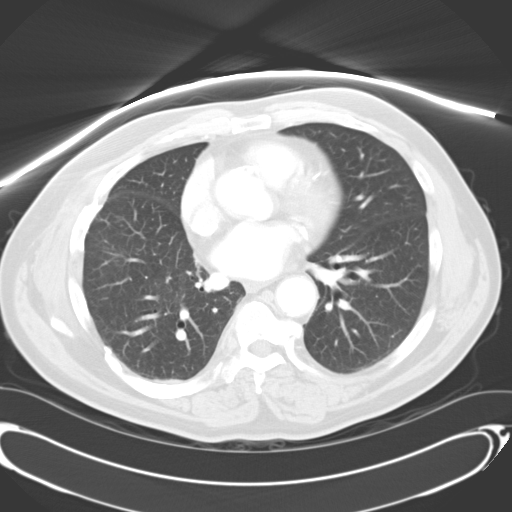
[im 68/91  soft-tissue]
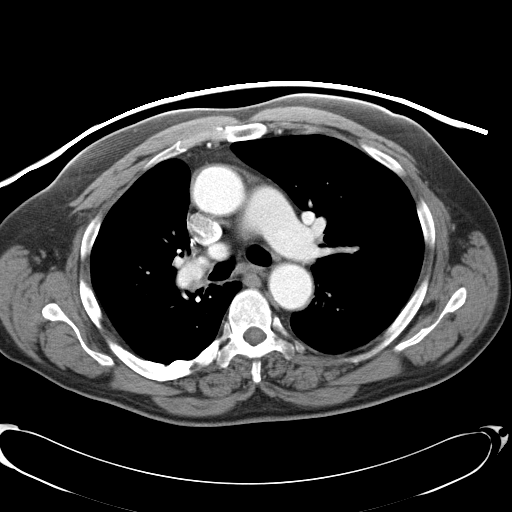
[im 68/91  lung]
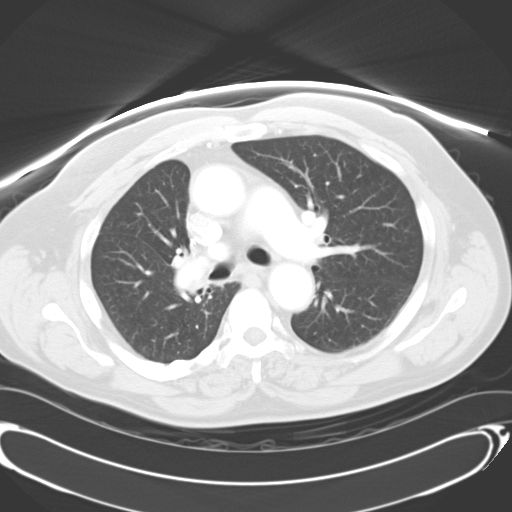
[im 79/91  soft-tissue]
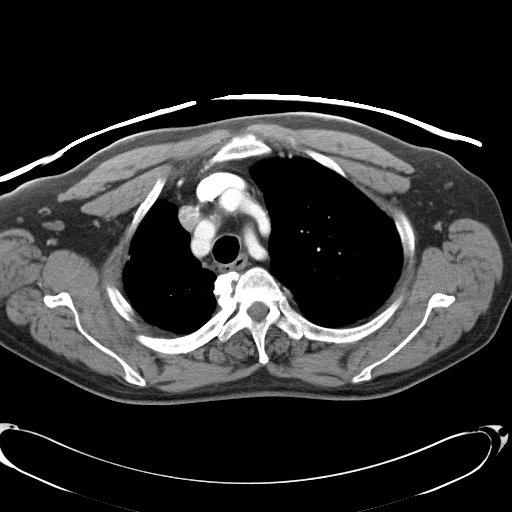
[im 79/91  lung]
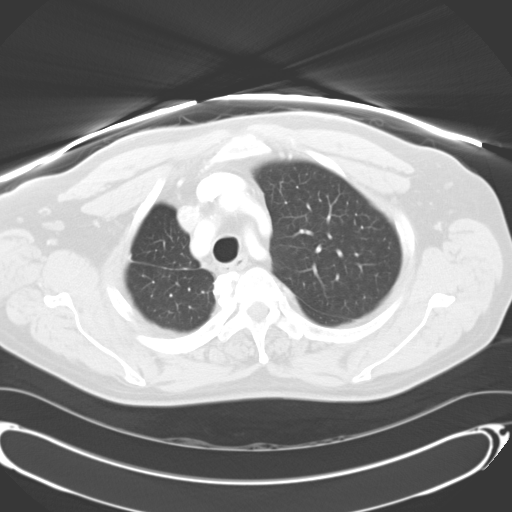

[Series 602: coronal arterial · coronal · arterial · 0.78mm/px · 3 of 98 slices shown, 4 images]
[im 25/98  soft-tissue]
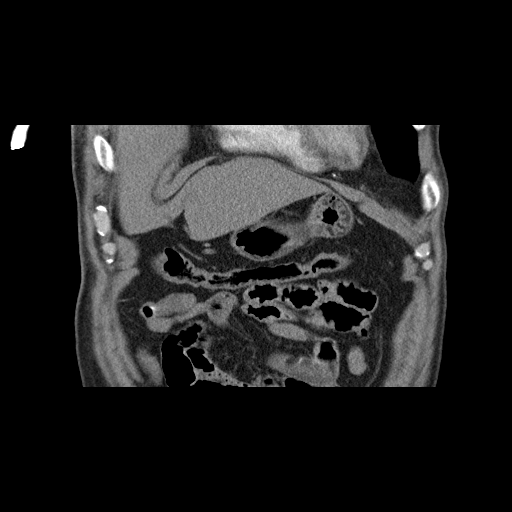
[im 49/98  soft-tissue]
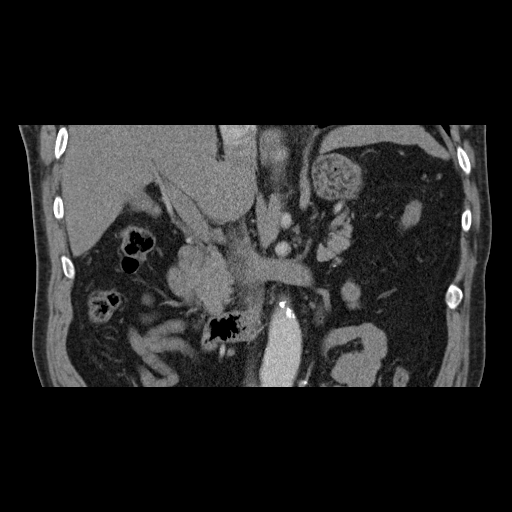
[im 49/98  bone]
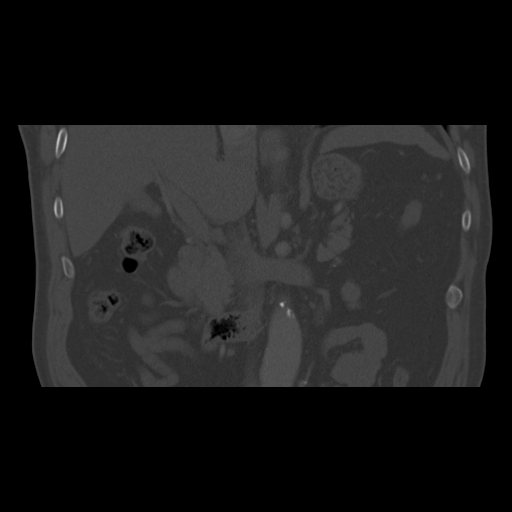
[im 73/98  soft-tissue]
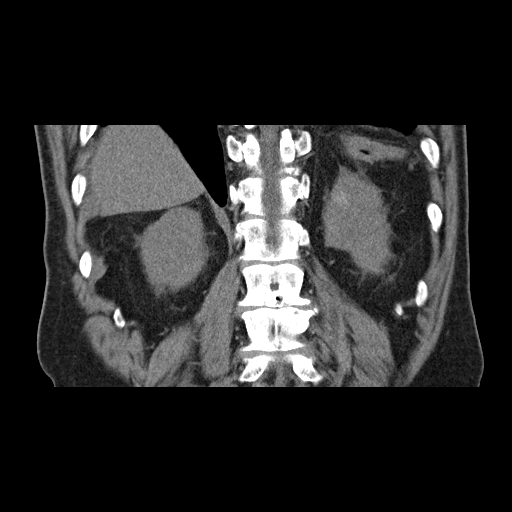

[12 of 46 positions shown; findings below may reference images not displayed]

FINDINGS: No evidence of axillary or supraclavicular
lymphadenopathy.  No mediastinal or hilar lymphadenopathy.  No
pericardial effusion.  Coronary artery calcifications present.

Postsurgical change in the right upper hemithorax.  No evidence of
nodularity or mass.  Postoperative thoracotomy changes of the right
ribs.  Left lung is clear.
IMPRESSION: Stable postsurgical change in the right hemithorax.  No evidence of
lung cancer recurrence.

CT ABDOMEN AND PELVIS
FINDINGS: Hyperdense left renal lesion is unchanged in size
measuring 18 mm compared 19 on prior.  This lesion demonstrates no
enhancement on the pre and post contrast sequences.  There are
multiple simple cysts within the right kidney which are unchanged.

Diffuse fatty infiltration of the liver.  No focal hepatic lesion.
There is a hypervascular focus within the posterior right hepatic
lobe (image 50) which is slightly more conspicuous but without
evidence of mass. This was present on the CT 01/09/2007. This is
likely a vascular phenomenon such as arterial portal shunting.  The
pancreas, spleen, and adrenal glands are normal.

No evidence of abdominal lymphadenopathy.  Stomach and limited view
of the bowel appear normal.  No aggressive osseous lesions.
IMPRESSION: 1.  No evidence of metastasis within the abdomen.
2.  Non enhancing hyperdense lesion within the left kidney is
unchanged in size and likely represents a collapsed hemorrhagic
cyst.
3.  Hypervascular focus within the superior right hepatic lobe
likely represents a benign vascular phenomenon.

## 2012-06-30 ENCOUNTER — Encounter: Payer: Self-pay | Admitting: Internal Medicine

## 2012-06-30 ENCOUNTER — Ambulatory Visit (INDEPENDENT_AMBULATORY_CARE_PROVIDER_SITE_OTHER): Payer: Medicare HMO | Admitting: Internal Medicine

## 2012-06-30 VITALS — BP 140/76 | HR 88 | Temp 97.2°F | Ht 73.5 in | Wt 205.6 lb

## 2012-06-30 DIAGNOSIS — Z0000 Encounter for general adult medical examination without abnormal findings: Secondary | ICD-10-CM

## 2012-06-30 DIAGNOSIS — Z23 Encounter for immunization: Secondary | ICD-10-CM

## 2012-06-30 DIAGNOSIS — M1610 Unilateral primary osteoarthritis, unspecified hip: Secondary | ICD-10-CM

## 2012-06-30 DIAGNOSIS — M109 Gout, unspecified: Secondary | ICD-10-CM

## 2012-06-30 DIAGNOSIS — I10 Essential (primary) hypertension: Secondary | ICD-10-CM

## 2012-06-30 DIAGNOSIS — C3490 Malignant neoplasm of unspecified part of unspecified bronchus or lung: Secondary | ICD-10-CM

## 2012-06-30 DIAGNOSIS — M25559 Pain in unspecified hip: Secondary | ICD-10-CM

## 2012-06-30 DIAGNOSIS — M25552 Pain in left hip: Secondary | ICD-10-CM

## 2012-06-30 DIAGNOSIS — E785 Hyperlipidemia, unspecified: Secondary | ICD-10-CM

## 2012-06-30 MED ORDER — INDOMETHACIN 25 MG PO CAPS: 25.0000 mg | ORAL_CAPSULE | Freq: Three times a day (TID) | ORAL | Status: DC | PRN

## 2012-06-30 MED ORDER — COLCHICINE 0.6 MG PO TABS: 0.6000 mg | ORAL_TABLET | Freq: Two times a day (BID) | ORAL | Status: DC | PRN

## 2012-06-30 MED ORDER — HYDROCODONE-ACETAMINOPHEN 7.5-325 MG PO TABS: 1.0000 | ORAL_TABLET | Freq: Three times a day (TID) | ORAL | Status: DC | PRN

## 2012-06-30 NOTE — Assessment & Plan Note (Signed)
-  Will give flu shot today -Patient's colonoscopy, pneumococcal vaccination, tetanus, and Zostavax are up to date.

## 2012-06-30 NOTE — Assessment & Plan Note (Signed)
Patient still has severe left hip pain. Currently it is controlled by Norco. Patient does not have pain medication contract. I will do UDS and let his PCP to decide whether he should be on chronic Norco and sign a contact in next visit.

## 2012-06-30 NOTE — Assessment & Plan Note (Signed)
Recent CT scan on 01/27/12 did not show any recurrent or metastasized to disease. He is followed up by his oncologist, Dr. Arbutus Ped.

## 2012-06-30 NOTE — Assessment & Plan Note (Signed)
BP Readings from Last 3 Encounters:  06/30/12 140/76  02/27/12 131/76  01/29/12 124/69    Lab Results  Component Value Date   NA 139 01/27/2012   K 3.8 01/27/2012   CREATININE 1.34 01/27/2012    Assessment:  Blood pressure control: controlled  Progress toward BP goal:  at goal  Comments:   Plan:  Medications:  continue current medications  Educational resources provided: handout;brochure  Self management tools provided: home blood pressure logbook  Other plans: Pressure is well controlled. We'll continue current regimen

## 2012-06-30 NOTE — Progress Notes (Signed)
Patient ID: [name], male   DOB: 12-17-1945, 67 y.o.   MRN: [id number] Subjective:   Patient ID: [name] male   DOB: 1946/06/10 67 y.o.   MRN: [id number]  CC:    Regular follow up visit for HTN and HLD  HPI:  [name] is a 67 y.o. man with past medical history as outlined below, who presents for a followup visit.              Patient reports that he has been taking all his medications regularly. He does not have any new complaints today. He wants to have some of his medications be refilled.  1) Lung cancer: Patient has stage IB (T2, N0, M0) non-small cell cancer diagnosed down to some 7. His posterior status of the right upper lobe lobectomy. He has been followed up by [facility]. He had a CT scan on 01/27/12 which did not show any recurrent or metastasized to disease. Currently patient does not have chest pain and shortness of breath.   2) HTN: It is well controlled. Patient currently taking Lasix 20 mg daily and metoprolol 50 mg daily. His blood pressure is 140/76 mmHg today. Patient does not have chest pain, palpitation, shortness of breath, or leg edema.  3.) HLD: Patient is currently taking Crestor 20 mg daily. He did not notice any side effects, such as muscle pain. His AST and ALT were normal on 01/27/12. His last LDL was 100 on 01/10/12.  4.) Gout-left foot: Patient is on colchicine and indomethacin. There is no signs of acute flare today.  5.) Left Hip pain: Patient is still complaining of severe left hip pain. He said his pain is controlled with Norco. Without pain medication, patient reports that he could barely walk.    Past Medical History  Diagnosis Date  . Diverticulosis of colon   . GERD (gastroesophageal reflux disease)   . Gout   . Hypertension   . Hyperlipidemia   . Renal failure, acute     hx of 2/2 medication, resolved  . Barretts esophagus     bx distal esophagus 2011 neg   . Tobacco abuse   . Alcohol abuse   . Hx of adenomatous colonic  polyps   . CHF (congestive heart failure)   . Hiatal hernia     2 cm noted on upper endos 2011   . Cataract   . Internal hemorrhoid   . DJD (degenerative joint disease)     left hip  . Myocardial infarction     hx of, 2 stents 2004. pt was on Plavix x 3 years then transitioned to ASA  . 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 amLODipine (NORVASC) 10 MG tablet TAKE 1 TABLET (10 MG TOTAL) BY MOUTH DAILY.  30 tablet  3  . aspirin EC 81 MG tablet Take 1 tablet (81 mg total) by mouth daily.  150 tablet  2  . colchicine 0.6 MG tablet Take 1 tablet (0.6 mg total) by mouth 2 (two) times daily as needed. For gout flare up  30 tablet  3  . diclofenac (VOLTAREN) 75 MG EC tablet TAKE 1 TABLET (75 MG TOTAL) BY MOUTH 2 (TWO) TIMES DAILY.  60 tablet  0  . furosemide (LASIX) 20 MG tablet TAKE 1 TABLET (20 MG TOTAL) BY MOUTH DAILY.  30 tablet  5  . HYDROcodone-acetaminophen (NORCO)  7.5-325 MG per tablet Take 1 tablet by mouth every 8 (eight) hours as needed for pain.  90 tablet  0  . indomethacin (INDOCIN) 25 MG capsule Take 1-2 capsules (25-50 mg total) by mouth 3 (three) times daily as needed. For gout pain  60 capsule  3  . metoprolol (LOPRESSOR) 50 MG tablet TAKE 1/2 TABLET BY MOUTH TWICE DAILY  31 tablet  5  . omeprazole (PRILOSEC) 20 MG capsule TAKE 1 CAPSULE (20 MG TOTAL) BY MOUTH 2 (TWO) TIMES DAILY.  60 capsule  5  . CRESTOR 20 MG tablet TAKE 1 TABLET (20 MG TOTAL) BY MOUTH DAILY.  30 tablet  3   Family History  Problem Relation Age of Onset  . Diabetes Mother   . Hypertension Sister   . Heart disease Sister   . Diabetes Sister   . Arthritis Brother   . Gout Brother   . Colon cancer Neg Hx   . Esophageal cancer Neg Hx   . Rectal cancer Neg Hx   . Stomach cancer Neg Hx    History   Social History  . Marital Status: Single    Spouse Name: N/A    Number of Children: N/A  .  Years of Education: N/A   Social History Main Topics  . Smoking status: Current Every Day Smoker -- 0.5 packs/day    Types: Cigarettes  . Smokeless tobacco: Never Used     Comment: trying to cut down  . Alcohol Use: Yes     Comment: drinking 1 pt gin qweek, (mixed with tap water not tonic)  . Drug Use: No  . Sexually Active: None   Other Topics Concern  . None   Social History Narrative  . None    Review of Systems: General: no fevers, chills, no changes in body weight, no changes in appetite Skin: no rash HEENT: no blurry vision, hearing changes or sore throat Pulm: no dyspnea, coughing, wheezing CV: no chest pain, palpitations, shortness of breath Abd: no nausea/vomiting, abdominal pain, diarrhea/constipation GU: no dysuria, hematuria, polyuria Ext: has left hip pain Neuro: no weakness, numbness, or tingling  Objective:  Physical Exam: Filed Vitals:   06/30/12 1532  BP: 140/76  Pulse: 88  Temp: 97.2 F (36.2 C)  TempSrc: Oral  Height: 6' 1.5" (1.867 m)  Weight: 205 lb 9.6 oz (93.26 kg)  SpO2: 93%   general: Not in acute distress HEENT: PERRL, EOMI, no scleral icterus, No bruit or JVD Cardiac: S1/S2, RRR, No murmurs, gallops or rubs Pulm: Good air movement bilaterally, Clear to auscultation bilaterally, No rales, wheezing, rhonchi or rubs. Abd: Soft,  nondistended, nontender, no rebound pain, no organomegaly, BS present Ext: No rashes or edema, 2+DP/PT pulse bilaterally Musculoskeletal: No joint deformities, erythema, or stiffness, ROM full and nontender Skin: no rashes. No skin bruise. Neuro: Alert and oriented X3, cranial nerves II-XII grossly intact, muscle strength 5/5 in all extremeties,  sensation to light touch intact.  Psych: patient is not psychotic, no suicidal or hemocidal ideation.    Assessment & Plan:

## 2012-06-30 NOTE — Assessment & Plan Note (Signed)
It is well controlled. Recent LDL was 100 at 01/10/12. Patient liver function was normal on 01/27/12. Will continue current regimen.

## 2012-06-30 NOTE — Patient Instructions (Signed)
1. You have done great job in taking all your medications. I appreciate it very much. Please continue doing that. 2. Please take all medications as prescribed.  3. If you have worsening of your symptoms or new symptoms arise, please call the clinic ([phone]), or go to the ER immediately if symptoms are severe.     

## 2012-07-01 LAB — PRESCRIPTION ABUSE MONITORING 15P, URINE
Buprenorphine, Urine: NEGATIVE ng/mL
Cocaine Metabolites: NEGATIVE ng/mL
Creatinine, Urine: 146.19 mg/dL (ref >20.0)
Methadone Screen, Urine: NEGATIVE ng/mL
Propoxyphene: NEGATIVE ng/mL

## 2012-07-03 LAB — OPIATES/OPIOIDS (LC/MS-MS)
Heroin (6-AM), UR: NEGATIVE ng/mL
Hydrocodone: 1288 ng/mL — ABNORMAL HIGH
Morphine Urine: NEGATIVE ng/mL
Noroxycodone, Ur: NEGATIVE ng/mL
Oxycodone, ur: NEGATIVE ng/mL
Oxymorphone: NEGATIVE ng/mL

## 2012-07-12 ENCOUNTER — Emergency Department (HOSPITAL_COMMUNITY)
Admission: EM | Admit: 2012-07-12 | Discharge: 2012-07-12 | Disposition: A | Discharge status: Home / Self Care | Payer: Medicare HMO | Attending: Emergency Medicine | Admitting: Emergency Medicine

## 2012-07-12 ENCOUNTER — Encounter (HOSPITAL_COMMUNITY): Payer: Self-pay | Admitting: Emergency Medicine

## 2012-07-12 ENCOUNTER — Emergency Department (HOSPITAL_COMMUNITY): Payer: Medicare HMO

## 2012-07-12 DIAGNOSIS — I509 Heart failure, unspecified: Secondary | ICD-10-CM | POA: Insufficient documentation

## 2012-07-12 DIAGNOSIS — I252 Old myocardial infarction: Secondary | ICD-10-CM | POA: Insufficient documentation

## 2012-07-12 DIAGNOSIS — M1610 Unilateral primary osteoarthritis, unspecified hip: Secondary | ICD-10-CM | POA: Insufficient documentation

## 2012-07-12 DIAGNOSIS — E785 Hyperlipidemia, unspecified: Secondary | ICD-10-CM | POA: Insufficient documentation

## 2012-07-12 DIAGNOSIS — I10 Essential (primary) hypertension: Secondary | ICD-10-CM | POA: Insufficient documentation

## 2012-07-12 DIAGNOSIS — Z79899 Other long term (current) drug therapy: Secondary | ICD-10-CM | POA: Insufficient documentation

## 2012-07-12 DIAGNOSIS — Z87448 Personal history of other diseases of urinary system: Secondary | ICD-10-CM | POA: Insufficient documentation

## 2012-07-12 DIAGNOSIS — M169 Osteoarthritis of hip, unspecified: Secondary | ICD-10-CM | POA: Insufficient documentation

## 2012-07-12 DIAGNOSIS — F17200 Nicotine dependence, unspecified, uncomplicated: Secondary | ICD-10-CM | POA: Insufficient documentation

## 2012-07-12 DIAGNOSIS — Z85118 Personal history of other malignant neoplasm of bronchus and lung: Secondary | ICD-10-CM | POA: Insufficient documentation

## 2012-07-12 DIAGNOSIS — Z860100 Personal history of colon polyps, unspecified: Secondary | ICD-10-CM | POA: Insufficient documentation

## 2012-07-12 DIAGNOSIS — F1010 Alcohol abuse, uncomplicated: Secondary | ICD-10-CM | POA: Insufficient documentation

## 2012-07-12 DIAGNOSIS — Z8679 Personal history of other diseases of the circulatory system: Secondary | ICD-10-CM | POA: Insufficient documentation

## 2012-07-12 DIAGNOSIS — Z8719 Personal history of other diseases of the digestive system: Secondary | ICD-10-CM | POA: Insufficient documentation

## 2012-07-12 DIAGNOSIS — Z8669 Personal history of other diseases of the nervous system and sense organs: Secondary | ICD-10-CM | POA: Insufficient documentation

## 2012-07-12 DIAGNOSIS — M109 Gout, unspecified: Secondary | ICD-10-CM | POA: Insufficient documentation

## 2012-07-12 DIAGNOSIS — Z7982 Long term (current) use of aspirin: Secondary | ICD-10-CM | POA: Insufficient documentation

## 2012-07-12 DIAGNOSIS — M25539 Pain in unspecified wrist: Secondary | ICD-10-CM

## 2012-07-12 DIAGNOSIS — K219 Gastro-esophageal reflux disease without esophagitis: Secondary | ICD-10-CM | POA: Insufficient documentation

## 2012-07-12 MED ORDER — OXYCODONE-ACETAMINOPHEN 5-325 MG PO TABS: 2.0000 | ORAL_TABLET | ORAL | Status: DC | PRN

## 2012-07-12 MED ORDER — IBUPROFEN 400 MG PO TABS
800.0000 mg | ORAL_TABLET | Freq: Once | ORAL | Status: AC
  Administered 2012-07-12: 800 mg via ORAL
  Filled 2012-07-12: qty 2

## 2012-07-12 MED ORDER — OXYCODONE-ACETAMINOPHEN 5-325 MG PO TABS
1.0000 | ORAL_TABLET | Freq: Once | ORAL | Status: AC
  Administered 2012-07-12: 1 via ORAL
  Filled 2012-07-12: qty 1

## 2012-07-12 NOTE — Progress Notes (Signed)
Orthopedic Tech Progress Note Patient Details:  [name] 24-Jun-1945 [id number]  Ortho Devices Type of Ortho Device: Arm sling Ortho Device/Splint Location: LEFT ARM SLING Ortho Device/Splint Interventions: Application   Cammer, Mickie Bail 07/12/2012, 11:30 AM

## 2012-07-12 NOTE — ED Notes (Addendum)
Pt states hx gout in feet.  Currently experiencing L swollen wrist that started yesterday.  States he cannot "ball my fist up" and it was just a "little tingle" yesterday. "This morning I just can't take no more." Denies acute injury to wrist.  Takes colchicine and indomethacin for gout but has not taken it in 4-5 days.

## 2012-07-12 NOTE — ED Provider Notes (Signed)
History     CSN: [id number]  Arrival date & time 07/12/12  1478   First MD Initiated Contact with Patient 07/12/12 1001      Chief Complaint  Patient presents with  . Wrist Pain    (Consider location/radiation/quality/duration/timing/severity/associated sxs/prior treatment) Patient is a 67 y.o. male presenting with wrist pain. The history is provided by the patient. No language interpreter was used.  Wrist Pain This is a new problem. The current episode started yesterday. The problem occurs constantly. The problem has been gradually worsening. Pertinent negatives include no fever or joint swelling. The symptoms are aggravated by bending. He has tried nothing for the symptoms.   67 year old right-handed male coming in with left wrist pain that started yesterday morning with twinges of pain. States that the pain got worse last night. States about this morning he could not bend his wrists and had difficulty closing his hand. Patient has a past medical history of gout in his great toe. States he has drink alcohol in the last 2 days and red meat. Left wrist pain is 10 out of 10. He has taken nothing for pain. Patient recently had his gout medication refill on January 14 but has not started taking it. His primary care provider is Dr. Shirlee Latch.  Past Medical History  Diagnosis Date  . Diverticulosis of colon   . GERD (gastroesophageal reflux disease)   . Gout   . Hypertension   . Hyperlipidemia   . Renal failure, acute     hx of 2/2 medication, resolved  . Barretts esophagus     bx distal esophagus 2011 neg   . Tobacco abuse   . Alcohol abuse   . Hx of adenomatous colonic polyps   . CHF (congestive heart failure)   . Hiatal hernia     2 cm noted on upper endos 2011   . Cataract   . Internal hemorrhoid   . DJD (degenerative joint disease)     left hip  . Myocardial infarction     hx of, 2 stents 2004. pt was on Plavix x 3 years then transitioned to ASA  . Lung cancer     squamous  cell (T2N0MX), 11/07- surgically treated, no chemo/XRT    Past Surgical History  Procedure Date  . Lung lobectomy 11/07    RUL  . Flexible cystourethroscopy 05/24/06  . Other surgical history     hernia repair 2005   . Hernia repair   . Colonoscopy   . Upper gastrointestinal endoscopy     Family History  Problem Relation Age of Onset  . Diabetes Mother   . Hypertension Sister   . Heart disease Sister   . Diabetes Sister   . Arthritis Brother   . Gout Brother   . Colon cancer Neg Hx   . Esophageal cancer Neg Hx   . Rectal cancer Neg Hx   . Stomach cancer Neg Hx     History  Substance Use Topics  . Smoking status: Current Every Day Smoker -- 0.5 packs/day    Types: Cigarettes  . Smokeless tobacco: Never Used     Comment: trying to cut down  . Alcohol Use: Yes     Comment: drinking 1 pt gin qweek, (mixed with tap water not tonic)      Review of Systems  Constitutional: Negative.  Negative for fever.  HENT: Negative.   Eyes: Negative.   Respiratory: Negative.   Cardiovascular: Negative.   Gastrointestinal: Negative.   Musculoskeletal: Negative  for joint swelling.       Left wrist pain  Neurological: Negative.   Psychiatric/Behavioral: Negative.   All other systems reviewed and are negative.    Allergies  Candesartan cilexetil-hctz and Hydrochlorothiazide  Home Medications   Current Outpatient Rx  Name  Route  Sig  Dispense  Refill  . AMLODIPINE BESYLATE 10 MG PO TABS   Oral   Take 10 mg by mouth daily.         . ASPIRIN EC 81 MG PO TBEC   Oral   Take 81 mg by mouth at bedtime.         . COLCHICINE 0.6 MG PO TABS   Oral   Take 1 tablet (0.6 mg total) by mouth 2 (two) times daily as needed. For gout flare up   30 tablet   3   . FUROSEMIDE 20 MG PO TABS   Oral   Take 20 mg by mouth daily.         Marland Kitchen HYDROCODONE-ACETAMINOPHEN 7.5-325 MG PO TABS   Oral   Take 1 tablet by mouth 3 (three) times daily.         . INDOMETHACIN 25 MG PO  CAPS   Oral   Take 1-2 capsules (25-50 mg total) by mouth 3 (three) times daily as needed. For gout pain   60 capsule   3   . METOPROLOL TARTRATE 50 MG PO TABS   Oral   Take 25 mg by mouth 2 (two) times daily.         Marland Kitchen OMEPRAZOLE 20 MG PO CPDR   Oral   Take 20 mg by mouth 2 (two) times daily.         Marland Kitchen ROSUVASTATIN CALCIUM 20 MG PO TABS   Oral   Take 20 mg by mouth daily.           BP 145/86  Pulse 82  Temp 97 F (36.1 C) (Oral)  Resp 30  SpO2 94%  Physical Exam  Nursing note and vitals reviewed. Constitutional: He is oriented to person, place, and time. He appears well-developed and well-nourished.  HENT:  Head: Normocephalic.  Eyes: Conjunctivae normal and EOM are normal. Pupils are equal, round, and reactive to light.  Neck: Normal range of motion. Neck supple.  Cardiovascular: Normal rate.   Pulmonary/Chest: Effort normal.  Abdominal: Soft.  Musculoskeletal: Normal range of motion.       Left wrist tenderness, minimal swelling, joint is inflamed and warm to touch. No fluctuance. 2+ radial pulse. Good sensation and movement below the pain that he is having difficulty gripping due to pain  Neurological: He is alert and oriented to person, place, and time.  Skin: Skin is warm and dry.  Psychiatric: He has a normal mood and affect.    ED Course  Procedures (including critical care time)  Labs Reviewed - No data to display No results found.   No diagnosis found.  Gout  MDM  67 year old male with left wrist pain since yesterday. Past medical history of gout and he has been off his medicine and drinking alcohol and eating red meat. He will restart his gout medications. Patient was given ibuprofen/percocet in the ER today. I also recommend using ice and elevating the left wrist. He will followup with his primary care physician this week. No fever.  Non toxic appearance.        Remi Haggard, NP 07/12/12 1818

## 2012-07-12 NOTE — ED Notes (Signed)
Pt called wife to pick him up at [facility].

## 2012-07-13 NOTE — ED Provider Notes (Signed)
Medical screening examination/treatment/procedure(s) were performed by non-physician practitioner and as supervising physician I was immediately available for consultation/collaboration.  Cashel Bellina T Kori Colin, MD 07/13/12 0848 

## 2012-07-16 ENCOUNTER — Other Ambulatory Visit: Payer: Self-pay | Admitting: *Deleted

## 2012-07-16 MED ORDER — ROSUVASTATIN CALCIUM 20 MG PO TABS: 20.0000 mg | ORAL_TABLET | Freq: Every day | ORAL | Status: DC

## 2012-08-06 ENCOUNTER — Ambulatory Visit (INDEPENDENT_AMBULATORY_CARE_PROVIDER_SITE_OTHER): Payer: Medicare HMO | Admitting: Internal Medicine

## 2012-08-06 ENCOUNTER — Encounter: Payer: Self-pay | Admitting: Internal Medicine

## 2012-08-06 VITALS — BP 114/68 | HR 85 | Temp 98.0°F | Ht 73.5 in | Wt 211.5 lb

## 2012-08-06 DIAGNOSIS — M109 Gout, unspecified: Secondary | ICD-10-CM

## 2012-08-06 DIAGNOSIS — F17200 Nicotine dependence, unspecified, uncomplicated: Secondary | ICD-10-CM

## 2012-08-06 DIAGNOSIS — C3490 Malignant neoplasm of unspecified part of unspecified bronchus or lung: Secondary | ICD-10-CM

## 2012-08-06 DIAGNOSIS — I10 Essential (primary) hypertension: Secondary | ICD-10-CM

## 2012-08-06 DIAGNOSIS — Z86010 Personal history of colonic polyps: Secondary | ICD-10-CM

## 2012-08-06 DIAGNOSIS — E785 Hyperlipidemia, unspecified: Secondary | ICD-10-CM

## 2012-08-06 LAB — BASIC METABOLIC PANEL
CO2: 25 mEq/L (ref 19–32)
Glucose, Bld: 83 mg/dL (ref 70–99)
Potassium: 3.8 mEq/L (ref 3.5–5.3)
Sodium: 144 mEq/L (ref 135–145)

## 2012-08-06 MED ORDER — ROSUVASTATIN CALCIUM 20 MG PO TABS: 20.0000 mg | ORAL_TABLET | Freq: Every day | ORAL | Status: DC

## 2012-08-06 MED ORDER — COLCHICINE 0.6 MG PO TABS: 0.6000 mg | ORAL_TABLET | Freq: Every day | ORAL | Status: DC

## 2012-08-06 MED ORDER — AMLODIPINE BESYLATE 10 MG PO TABS: 10.0000 mg | ORAL_TABLET | Freq: Every day | ORAL | Status: DC

## 2012-08-06 MED ORDER — METOPROLOL TARTRATE 50 MG PO TABS: 25.0000 mg | ORAL_TABLET | Freq: Two times a day (BID) | ORAL | Status: DC

## 2012-08-06 MED ORDER — ASPIRIN EC 81 MG PO TBEC: 81.0000 mg | DELAYED_RELEASE_TABLET | Freq: Every day | ORAL | Status: DC

## 2012-08-06 MED ORDER — ALLOPURINOL 100 MG PO TABS: 100.0000 mg | ORAL_TABLET | Freq: Every day | ORAL | Status: DC

## 2012-08-06 MED ORDER — FUROSEMIDE 20 MG PO TABS: 20.0000 mg | ORAL_TABLET | Freq: Every day | ORAL | Status: DC

## 2012-08-06 MED ORDER — OMEPRAZOLE 20 MG PO CPDR: 20.0000 mg | DELAYED_RELEASE_CAPSULE | Freq: Two times a day (BID) | ORAL | Status: DC

## 2012-08-06 NOTE — Assessment & Plan Note (Addendum)
Recent acute flare 06/2012  Can take any NSAID for acute gout flare Rx Allopurinol 100 mg qd, Colchicine 0.6 mg qd (x 6 mos) Check uric acid level goal <6 Given handout on foods, drinks to avoid

## 2012-08-06 NOTE — Patient Instructions (Addendum)
General Instructions: You can take Advil or Ibuprofen for a gout flare Try to stop smoking and drinking alcohol  Read this information Follow up in 6 months with me, sooner if needed    Treatment Goals:  Goals (1 Years of Data) as of 08/06/12   None      Progress Toward Treatment Goals:  Treatment Goal 08/06/2012  Blood pressure at goal  Stop smoking smoking less    Self Care Goals & Plans:  Self Care Goal 08/06/2012  Manage my medications take my medicines as prescribed; bring my medications to every visit; refill my medications on time  Monitor my health keep track of my blood pressure  Eat healthy foods drink diet soda or water instead of juice or soda; eat more vegetables; eat foods that are low in salt  Be physically active -       Care Management & Community Referrals:  Referral 08/06/2012  Referrals made for care management support smoking cessation counselor  Referrals made to community resources smoking cessation       Gout Gout is an inflammatory condition (arthritis) caused by a buildup of uric acid crystals in the joints. Uric acid is a chemical that is normally present in the blood. Under some circumstances, uric acid can form into crystals in your joints. This causes joint redness, soreness, and swelling (inflammation). Repeat attacks are common. Over time, uric acid crystals can form into masses (tophi) near a joint, causing disfigurement. Gout is treatable and often preventable. CAUSES  The disease begins with elevated levels of uric acid in the blood. Uric acid is produced by your body when it breaks down a naturally found substance called purines. This also happens when you eat certain foods such as meats and fish. Causes of an elevated uric acid level include:  Being passed down from parent to child (heredity).  Diseases that cause increased uric acid production (obesity, psoriasis, some cancers).  Excessive alcohol use.  Diet, especially diets  rich in meat and seafood.  Medicines, including certain cancer-fighting drugs (chemotherapy), diuretics, and aspirin.  Chronic kidney disease. The kidneys are no longer able to remove uric acid well.  Problems with metabolism. Conditions strongly associated with gout include:  Obesity.  High blood pressure.  High cholesterol.  Diabetes. Not everyone with elevated uric acid levels gets gout. It is not understood why some people get gout and others do not. Surgery, joint injury, and eating too much of certain foods are some of the factors that can lead to gout. SYMPTOMS   An attack of gout comes on quickly. It causes intense pain with redness, swelling, and warmth in a joint.  Fever can occur.  Often, only one joint is involved. Certain joints are more commonly involved:  Base of the big toe.  Knee.  Ankle.  Wrist.  Finger. Without treatment, an attack usually goes away in a few days to weeks. Between attacks, you usually will not have symptoms, which is different from many other forms of arthritis. DIAGNOSIS  Your caregiver will suspect gout based on your symptoms and exam. Removal of fluid from the joint (arthrocentesis) is done to check for uric acid crystals. Your caregiver will give you a medicine that numbs the area (local anesthetic) and use a needle to remove joint fluid for exam. Gout is confirmed when uric acid crystals are seen in joint fluid, using a special microscope. Sometimes, blood, urine, and X-ray tests are also used. TREATMENT  There are 2 phases to gout  treatment: treating the sudden onset (acute) attack and preventing attacks (prophylaxis). Treatment of an Acute Attack  Medicines are used. These include anti-inflammatory medicines or steroid medicines.  An injection of steroid medicine into the affected joint is sometimes necessary.  The painful joint is rested. Movement can worsen the arthritis.  You may use warm or cold treatments on painful  joints, depending which works best for you.  Discuss the use of coffee, vitamin C, or cherries with your caregiver. These may be helpful treatment options. Treatment to Prevent Attacks After the acute attack subsides, your caregiver may advise prophylactic medicine. These medicines either help your kidneys eliminate uric acid from your body or decrease your uric acid production. You may need to stay on these medicines for a very long time. The early phase of treatment with prophylactic medicine can be associated with an increase in acute gout attacks. For this reason, during the first few months of treatment, your caregiver may also advise you to take medicines usually used for acute gout treatment. Be sure you understand your caregiver's directions. You should also discuss dietary treatment with your caregiver. Certain foods such as meats and fish can increase uric acid levels. Other foods such as dairy can decrease levels. Your caregiver can give you a list of foods to avoid. HOME CARE INSTRUCTIONS   Do not take aspirin to relieve pain. This raises uric acid levels.  Only take over-the-counter or prescription medicines for pain, discomfort, or fever as directed by your caregiver.  Rest the joint as much as possible. When in bed, keep sheets and blankets off painful areas.  Keep the affected joint raised (elevated).  Use crutches if the painful joint is in your leg.  Drink enough water and fluids to keep your urine clear or pale yellow. This helps your body get rid of uric acid. Do not drink alcoholic beverages. They slow the passage of uric acid.  Follow your caregiver's dietary instructions. Pay careful attention to the amount of protein you eat. Your daily diet should emphasize fruits, vegetables, whole grains, and fat-free or low-fat milk products.  Maintain a healthy body weight. SEEK MEDICAL CARE IF:   You have an oral temperature above 102 F (38.9 C).  You develop diarrhea,  vomiting, or any side effects from medicines.  You do not feel better in 24 hours, or you are getting worse. SEEK IMMEDIATE MEDICAL CARE IF:   Your joint becomes suddenly more tender and you have:  Chills.  An oral temperature above 102 F (38.9 C), not controlled by medicine. MAKE SURE YOU:   Understand these instructions.  Will watch your condition.  Will get help right away if you are not doing well or get worse. Document Released: 05/31/2000 Document Revised: 08/26/2011 Document Reviewed: 09/11/2009 [facility] Patient Information 2013 [place], [place].  Smoking Cessation Quitting smoking is important to your health and has many advantages. However, it is not always easy to quit since nicotine is a very addictive drug. Often times, people try 3 times or more before being able to quit. This document explains the best ways for you to prepare to quit smoking. Quitting takes hard work and a lot of effort, but you can do it. ADVANTAGES OF QUITTING SMOKING  You will live longer, feel better, and live better.  Your body will feel the impact of quitting smoking almost immediately.  Within 20 minutes, blood pressure decreases. Your pulse returns to its normal level.  After 8 hours, carbon monoxide levels in the  blood return to normal. Your oxygen level increases.  After 24 hours, the chance of having a heart attack starts to decrease. Your breath, hair, and body stop smelling like smoke.  After 48 hours, damaged nerve endings begin to recover. Your sense of taste and smell improve.  After 72 hours, the body is virtually free of nicotine. Your bronchial tubes relax and breathing becomes easier.  After 2 to 12 weeks, lungs can hold more air. Exercise becomes easier and circulation improves.  The risk of having a heart attack, stroke, cancer, or lung disease is greatly reduced.  After 1 year, the risk of coronary heart disease is cut in half.  After 5 years, the risk of stroke falls  to the same as a nonsmoker.  After 10 years, the risk of lung cancer is cut in half and the risk of other cancers decreases significantly.  After 15 years, the risk of coronary heart disease drops, usually to the level of a nonsmoker.  If you are pregnant, quitting smoking will improve your chances of having a healthy baby.  The people you live with, especially any children, will be healthier.  You will have extra money to spend on things other than cigarettes. QUESTIONS TO THINK ABOUT BEFORE ATTEMPTING TO QUIT You may want to talk about your answers with your caregiver.  Why do you want to quit?  If you tried to quit in the past, what helped and what did not?  What will be the most difficult situations for you after you quit? How will you plan to handle them?  Who can help you through the tough times? Your family? Friends? A caregiver?  What pleasures do you get from smoking? What ways can you still get pleasure if you quit? Here are some questions to ask your caregiver:  How can you help me to be successful at quitting?  What medicine do you think would be best for me and how should I take it?  What should I do if I need more help?  What is smoking withdrawal like? How can I get information on withdrawal? GET READY  Set a quit date.  Change your environment by getting rid of all cigarettes, ashtrays, matches, and lighters in your home, car, or work. Do not let people smoke in your home.  Review your past attempts to quit. Think about what worked and what did not. GET SUPPORT AND ENCOURAGEMENT You have a better chance of being successful if you have help. You can get support in many ways.  Tell your family, friends, and co-workers that you are going to quit and need their support. Ask them not to smoke around you.  Get individual, group, or telephone counseling and support. Programs are available at [employer] and health centers. Call your local health department for  information about programs in your area.  Spiritual beliefs and practices may help some smokers quit.  Download a "quit meter" on your computer to keep track of quit statistics, such as how long you have gone without smoking, cigarettes not smoked, and money saved.  Get a self-help book about quitting smoking and staying off of tobacco. LEARN NEW SKILLS AND BEHAVIORS  Distract yourself from urges to smoke. Talk to someone, go for a walk, or occupy your time with a task.  Change your normal routine. Take a different route to work. Drink tea instead of coffee. Eat breakfast in a different place.  Reduce your stress. Take a hot bath, exercise, or  read a book.  Plan something enjoyable to do every day. Reward yourself for not smoking.  Explore interactive web-based programs that specialize in helping you quit. GET MEDICINE AND USE IT CORRECTLY Medicines can help you stop smoking and decrease the urge to smoke. Combining medicine with the above behavioral methods and support can greatly increase your chances of successfully quitting smoking.  Nicotine replacement therapy helps deliver nicotine to your body without the negative effects and risks of smoking. Nicotine replacement therapy includes nicotine gum, lozenges, inhalers, nasal sprays, and skin patches. Some may be available over-the-counter and others require a prescription.  Antidepressant medicine helps people abstain from smoking, but how this works is unknown. This medicine is available by prescription.  Nicotinic receptor partial agonist medicine simulates the effect of nicotine in your brain. This medicine is available by prescription. Ask your caregiver for advice about which medicines to use and how to use them based on your health history. Your caregiver will tell you what side effects to look out for if you choose to be on a medicine or therapy. Carefully read the information on the package. Do not use any other product  containing nicotine while using a nicotine replacement product.  RELAPSE OR DIFFICULT SITUATIONS Most relapses occur within the first 3 months after quitting. Do not be discouraged if you start smoking again. Remember, most people try several times before finally quitting. You may have symptoms of withdrawal because your body is used to nicotine. You may crave cigarettes, be irritable, feel very hungry, cough often, get headaches, or have difficulty concentrating. The withdrawal symptoms are only temporary. They are strongest when you first quit, but they will go away within 10 14 days. To reduce the chances of relapse, try to:  Avoid drinking alcohol. Drinking lowers your chances of successfully quitting.  Reduce the amount of caffeine you consume. Once you quit smoking, the amount of caffeine in your body increases and can give you symptoms, such as a rapid heartbeat, sweating, and anxiety.  Avoid smokers because they can make you want to smoke.  Do not let weight gain distract you. Many smokers will gain weight when they quit, usually less than 10 pounds. Eat a healthy diet and stay active. You can always lose the weight gained after you quit.  Find ways to improve your mood other than smoking. FOR MORE INFORMATION  www.smokefree.gov  Document Released: 05/28/2001 Document Revised: 12/03/2011 Document Reviewed: 09/12/2011 [facility] Patient Information 2013 [place], [place].

## 2012-08-06 NOTE — Assessment & Plan Note (Signed)
In remission as of 01/2012 follows with oncology. Repeat CT 01/2013  Patient still actively smoking

## 2012-08-06 NOTE — Assessment & Plan Note (Signed)
Encouraged smoking cessation 

## 2012-08-06 NOTE — Assessment & Plan Note (Addendum)
Lipid Panel     Component Value Date/Time   CHOL 158 01/10/2012 1643   TRIG 62 01/10/2012 1643   HDL 46 01/10/2012 1643   CHOLHDL 3.4 01/10/2012 1643   VLDL 12 01/10/2012 1643   LDLCALC 100* 01/10/2012 1643  Cont. Crestor 20 qhs

## 2012-08-06 NOTE — Progress Notes (Signed)
  Subjective:    Patient ID: [name], male    DOB: Oct 15, 1945, 67 y.o.   MRN: [id number]  HPI Comments: 67 y.o PMH CAD status post stent, MI, HTN, dyslipidemia, tobacco abuse, CKD, Barretts esophagus, ED, GERD, gout (noted ED visit for flare 06/2012; reports gout flare 2x per year), h/o LBBB, h/o lung cancer stage 1B cancer status post lobectomy (as of 01/2012 CT in remission), h/o colon polyps and internal hemorrhoids (noted colonoscopy 02/2012 neg for malignancy due for f/u 02/2017).  He presents for f/u   SH: drinking gin at times daily mixed with water, smoking 10 cigarettes qd  HM: All health maintenance addressed.  Eye exam +cataracts and need for glasses.       Review of Systems  Constitutional: Negative for appetite change.  Respiratory:       Sob with exertion (i.e vacuuming)  Cardiovascular: Negative for chest pain and leg swelling.  Gastrointestinal: Negative for abdominal pain and blood in stool.  Genitourinary: Negative for difficulty urinating.  Musculoskeletal:       +recent gout flare left wrist  Neurological: Negative for dizziness.       Objective:   Physical Exam  Nursing note and vitals reviewed. Constitutional: He is oriented to person, place, and time. Vital signs are normal. He appears well-developed and well-nourished. He is cooperative.  HENT:  Head: Normocephalic and atraumatic.  Mouth/Throat: Oropharynx is clear and moist and mucous membranes are normal. He has dentures. No oropharyngeal exudate.  Eyes: Conjunctivae are normal. Pupils are equal, round, and reactive to light. Right eye exhibits no discharge. Left eye exhibits no discharge. No scleral icterus.  Cardiovascular: Normal rate, regular rhythm, S1 normal, S2 normal and normal heart sounds.   No murmur heard. Pulmonary/Chest: Effort normal and breath sounds normal.  Abdominal: Soft. Bowel sounds are normal. He exhibits no distension. There is no tenderness.  Obese ab  Musculoskeletal: He  exhibits no edema.  Neurological: He is alert and oriented to person, place, and time. He has normal strength. Gait normal.  Skin: Skin is warm, dry and intact. No rash noted.  Psychiatric: He has a normal mood and affect. His speech is normal and behavior is normal. Judgment and thought content normal. Cognition and memory are normal.          Assessment & Plan:  F/u 6 months, sooner if needed.

## 2012-08-06 NOTE — Assessment & Plan Note (Signed)
2013 colonoscopy with int. Hemorrhoids and benign polyps. Colonoscopy due 02/2017

## 2012-08-06 NOTE — Assessment & Plan Note (Addendum)
BP controlled  Will check BMET Cont Norvasc 10, Asa 81 mg qd, Lopressor 50 mg bid

## 2012-08-10 ENCOUNTER — Other Ambulatory Visit: Payer: Self-pay | Admitting: Internal Medicine

## 2012-08-27 ENCOUNTER — Encounter: Payer: Medicare HMO | Admitting: Internal Medicine

## 2012-09-10 ENCOUNTER — Other Ambulatory Visit: Payer: Self-pay | Admitting: Internal Medicine

## 2012-09-11 ENCOUNTER — Other Ambulatory Visit: Payer: Self-pay | Admitting: *Deleted

## 2012-09-11 NOTE — Telephone Encounter (Signed)
Last filled 2/24 

## 2012-09-13 MED ORDER — HYDROCODONE-ACETAMINOPHEN 7.5-325 MG PO TABS: 1.0000 | ORAL_TABLET | Freq: Three times a day (TID) | ORAL | Status: DC | PRN

## 2012-09-14 NOTE — Telephone Encounter (Signed)
Rx called in 

## 2012-09-21 ENCOUNTER — Other Ambulatory Visit: Payer: Self-pay | Admitting: Internal Medicine

## 2012-10-06 ENCOUNTER — Encounter: Payer: Self-pay | Admitting: Internal Medicine

## 2012-10-16 ENCOUNTER — Other Ambulatory Visit: Payer: Self-pay | Admitting: *Deleted

## 2012-10-19 ENCOUNTER — Other Ambulatory Visit: Payer: Self-pay | Admitting: Internal Medicine

## 2012-10-19 MED ORDER — HYDROCODONE-ACETAMINOPHEN 7.5-325 MG PO TABS: 1.0000 | ORAL_TABLET | Freq: Three times a day (TID) | ORAL | Status: DC | PRN

## 2012-11-08 IMAGING — CT CT CHEST W/O CM
2 of 3 series · 15 of 30 positions shown, 17 images · non-contrast
Comparison: 01/29/2010

CLINICAL DATA: Follow up right lung carcinoma.

CT CHEST WITHOUT CONTRAST
TECHNIQUE: Multidetector CT imaging of the chest was performed
following the standard protocol without IV contrast.

[Series 3: routine chest · axial · 0.70mm/px · z∈[-384,-144]mm · 7 of 66 slices shown, 9 images]
[im 9/66  mediastinal]
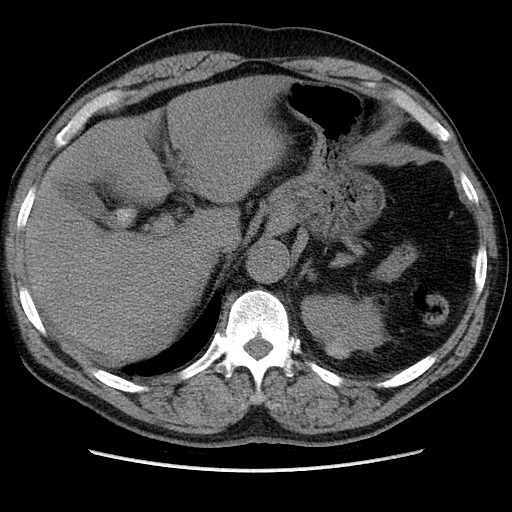
[im 9/66  lung]
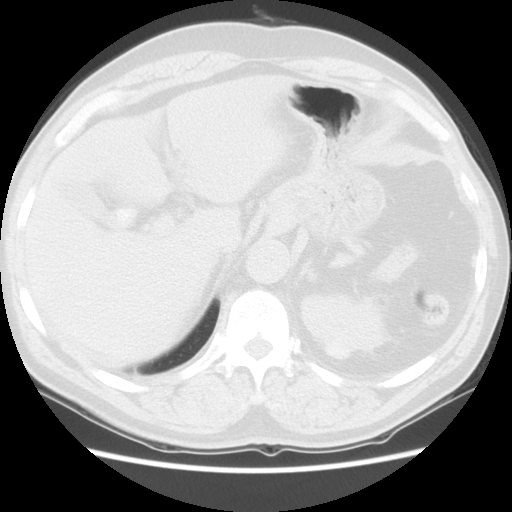
[im 17/66  lung]
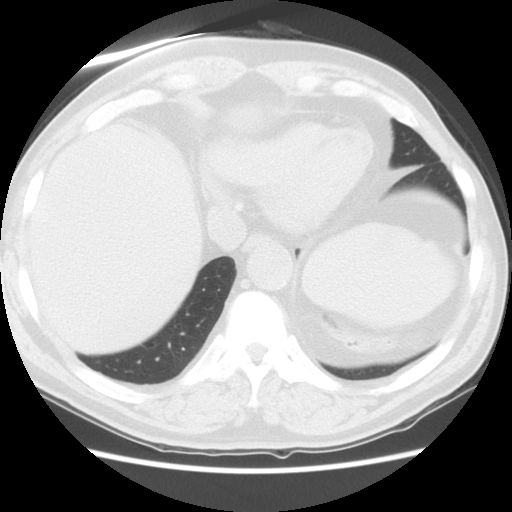
[im 25/66  lung]
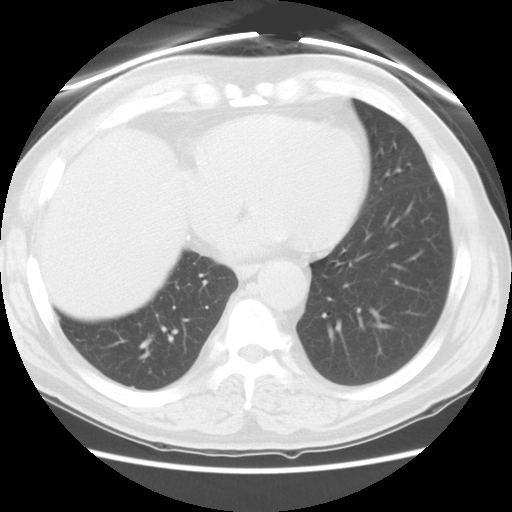
[im 33/66  lung]
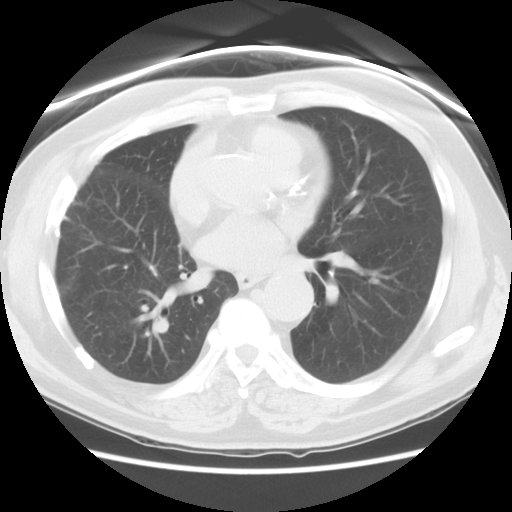
[im 41/66  mediastinal]
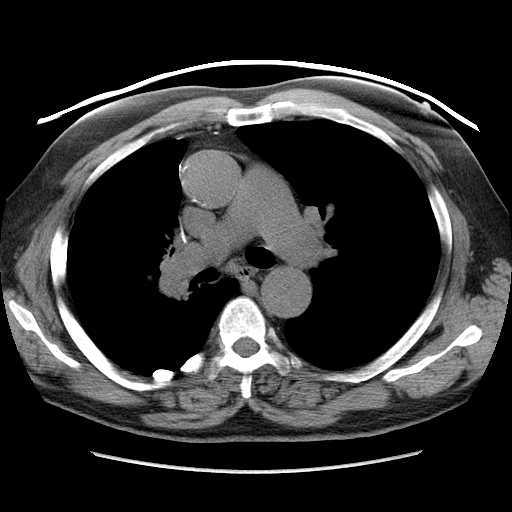
[im 41/66  lung]
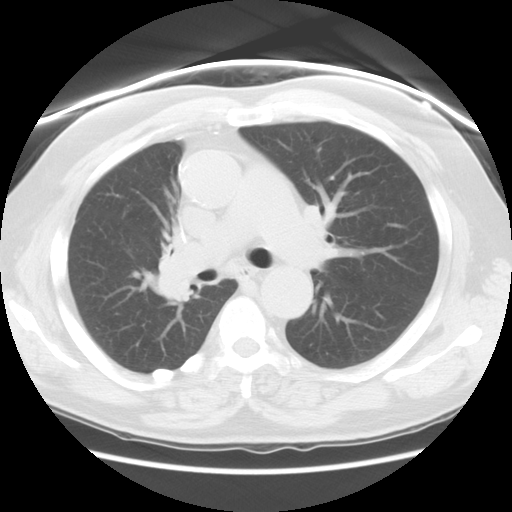
[im 49/66  lung]
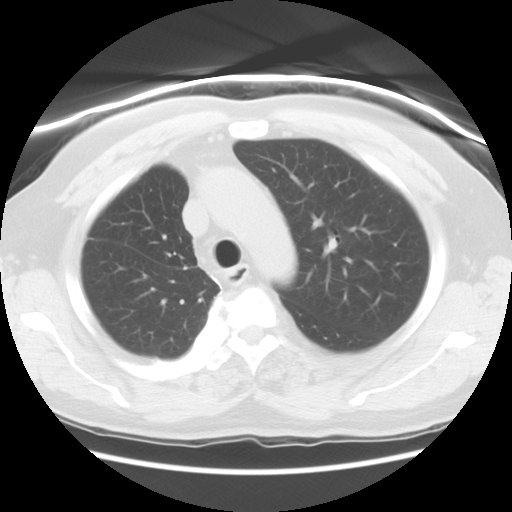
[im 57/66  lung]
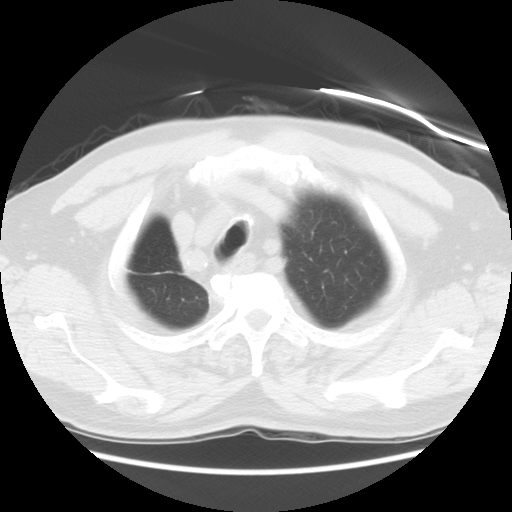

[Series 602: sagittal body · sagittal · 0.70mm/px · 8 of 145 slices shown]
[im 17/145  mediastinal]
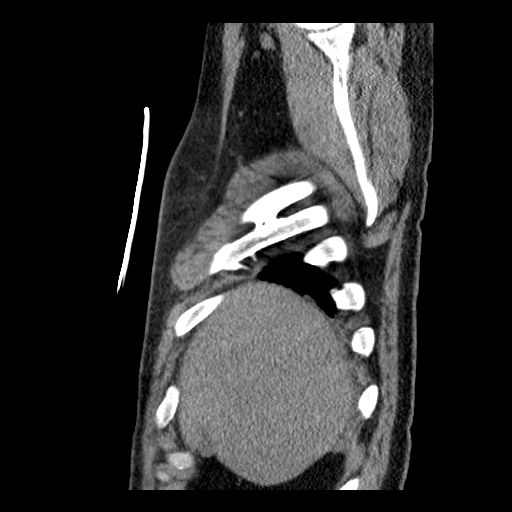
[im 33/145  mediastinal]
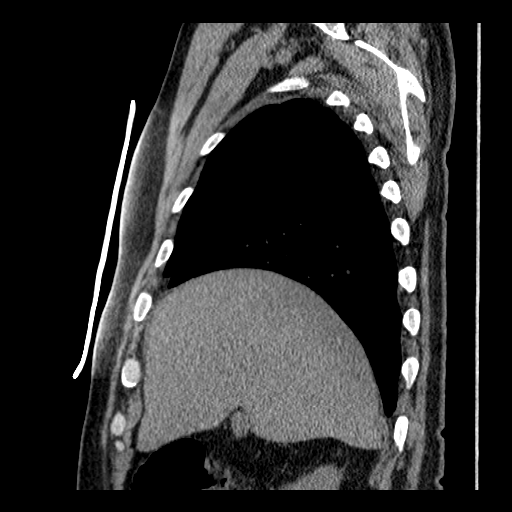
[im 49/145  mediastinal]
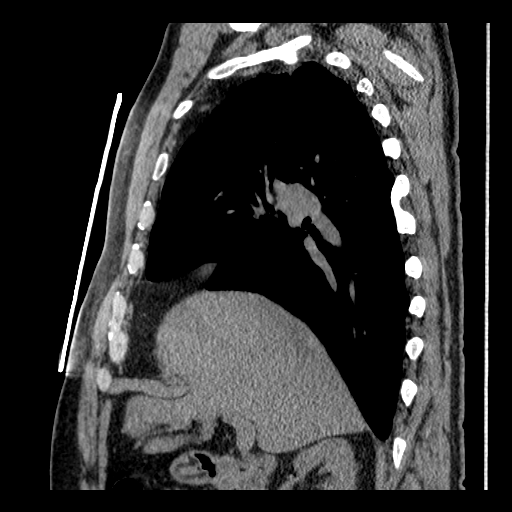
[im 65/145  mediastinal]
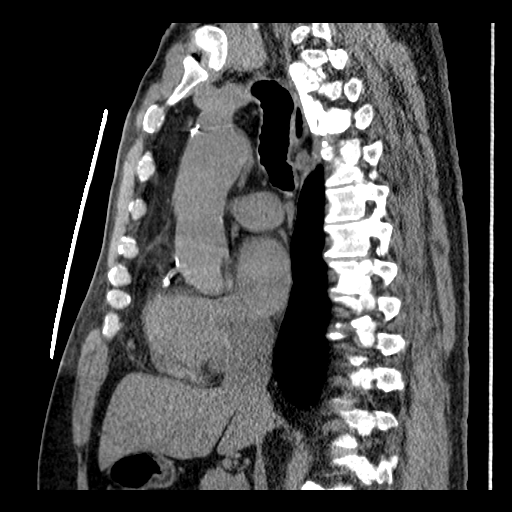
[im 81/145  mediastinal]
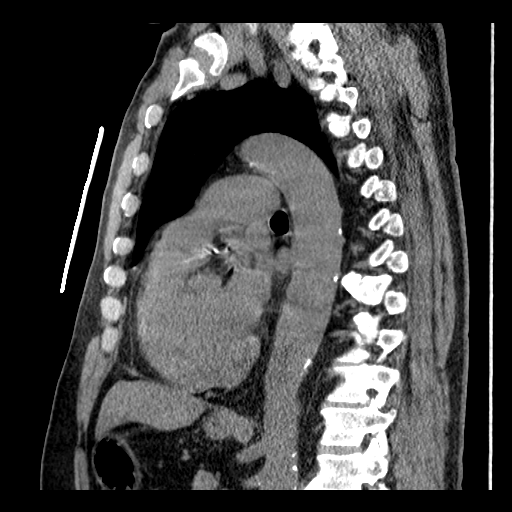
[im 97/145  mediastinal]
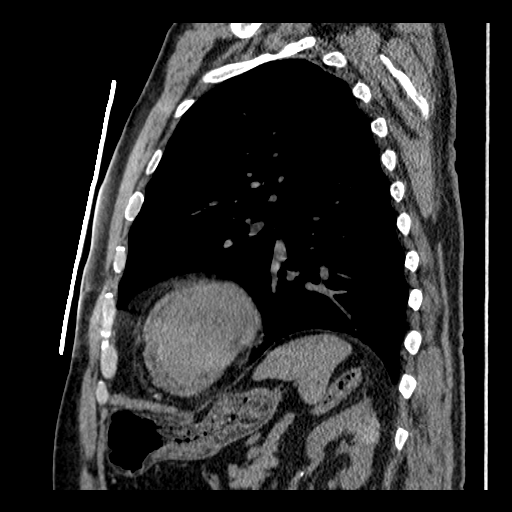
[im 113/145  mediastinal]
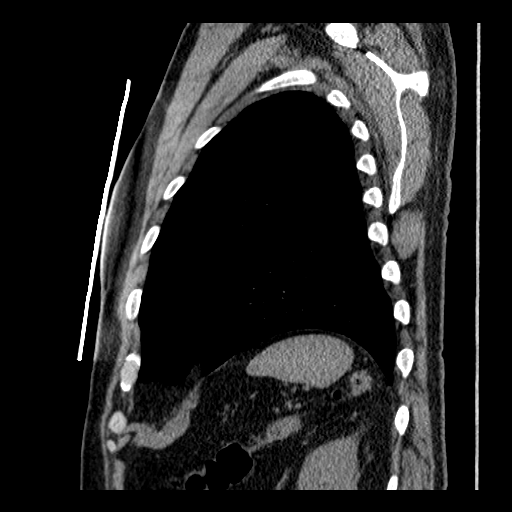
[im 129/145  mediastinal]
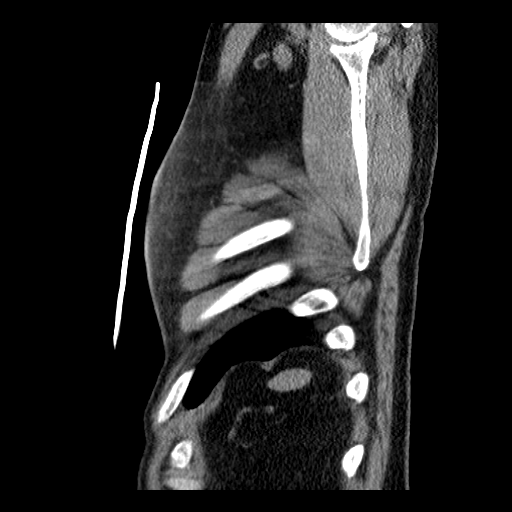

[15 of 30 positions shown; findings below may reference images not displayed]

FINDINGS: Postsurgical changes from previous right upper lobectomy
are stable.  No suspicious pulmonary nodules or masses are
identified.  There is no evidence of pulmonary infiltrate.  Central
airways are patent.

There is no evidence of pleural or pericardial effusion.  No
evidence of mediastinal or hilar masses.  Diffuse coronary artery
calcification again noted as well as tiny gallstones.  A small
hyperdense cyst in the upper pole of the left kidney remains stable
appearance.  Adrenal glands are normal in appearance.
IMPRESSION: 1.  Stable postsurgical changes in the right hemithorax.  No
evidence of recurrent or metastatic carcinoma within the thorax.
2.  Cholelithiasis.

## 2012-11-19 ENCOUNTER — Other Ambulatory Visit: Payer: Self-pay | Admitting: Internal Medicine

## 2012-11-19 ENCOUNTER — Other Ambulatory Visit: Payer: Self-pay | Admitting: *Deleted

## 2012-11-19 MED ORDER — HYDROCODONE-ACETAMINOPHEN 7.5-325 MG PO TABS: 1.0000 | ORAL_TABLET | Freq: Three times a day (TID) | ORAL | Status: DC | PRN

## 2012-11-22 ENCOUNTER — Emergency Department (HOSPITAL_COMMUNITY)
Admission: EM | Admit: 2012-11-22 | Discharge: 2012-11-22 | Disposition: A | Discharge status: Home / Self Care | Payer: Medicare HMO | Attending: Emergency Medicine | Admitting: Emergency Medicine

## 2012-11-22 ENCOUNTER — Encounter (HOSPITAL_COMMUNITY): Payer: Self-pay | Admitting: *Deleted

## 2012-11-22 DIAGNOSIS — Z87448 Personal history of other diseases of urinary system: Secondary | ICD-10-CM | POA: Insufficient documentation

## 2012-11-22 DIAGNOSIS — Z79899 Other long term (current) drug therapy: Secondary | ICD-10-CM | POA: Insufficient documentation

## 2012-11-22 DIAGNOSIS — K2270 Barrett's esophagus without dysplasia: Secondary | ICD-10-CM | POA: Insufficient documentation

## 2012-11-22 DIAGNOSIS — M169 Osteoarthritis of hip, unspecified: Secondary | ICD-10-CM | POA: Insufficient documentation

## 2012-11-22 DIAGNOSIS — I10 Essential (primary) hypertension: Secondary | ICD-10-CM | POA: Insufficient documentation

## 2012-11-22 DIAGNOSIS — Z8719 Personal history of other diseases of the digestive system: Secondary | ICD-10-CM | POA: Insufficient documentation

## 2012-11-22 DIAGNOSIS — E785 Hyperlipidemia, unspecified: Secondary | ICD-10-CM | POA: Insufficient documentation

## 2012-11-22 DIAGNOSIS — M1610 Unilateral primary osteoarthritis, unspecified hip: Secondary | ICD-10-CM | POA: Insufficient documentation

## 2012-11-22 DIAGNOSIS — H259 Unspecified age-related cataract: Secondary | ICD-10-CM | POA: Insufficient documentation

## 2012-11-22 DIAGNOSIS — Z85118 Personal history of other malignant neoplasm of bronchus and lung: Secondary | ICD-10-CM | POA: Insufficient documentation

## 2012-11-22 DIAGNOSIS — Z7982 Long term (current) use of aspirin: Secondary | ICD-10-CM | POA: Insufficient documentation

## 2012-11-22 DIAGNOSIS — F1010 Alcohol abuse, uncomplicated: Secondary | ICD-10-CM | POA: Insufficient documentation

## 2012-11-22 DIAGNOSIS — M109 Gout, unspecified: Secondary | ICD-10-CM | POA: Insufficient documentation

## 2012-11-22 DIAGNOSIS — R21 Rash and other nonspecific skin eruption: Secondary | ICD-10-CM | POA: Insufficient documentation

## 2012-11-22 DIAGNOSIS — K219 Gastro-esophageal reflux disease without esophagitis: Secondary | ICD-10-CM | POA: Insufficient documentation

## 2012-11-22 DIAGNOSIS — I509 Heart failure, unspecified: Secondary | ICD-10-CM | POA: Insufficient documentation

## 2012-11-22 DIAGNOSIS — I252 Old myocardial infarction: Secondary | ICD-10-CM | POA: Insufficient documentation

## 2012-11-22 DIAGNOSIS — F17200 Nicotine dependence, unspecified, uncomplicated: Secondary | ICD-10-CM | POA: Insufficient documentation

## 2012-11-22 MED ORDER — HYDROCORTISONE 1 % EX CREA: TOPICAL_CREAM | CUTANEOUS | Status: DC

## 2012-11-22 NOTE — ED Notes (Addendum)
Reports having rash to left arm x 1 week, denies itching or pain. No acute distress noted at triage.

## 2012-11-22 NOTE — ED Provider Notes (Signed)
History    This chart was scribed for Roxy Horseman,  non-physician practitioner working with Celene Kras, MD by Leone Payor, ED Scribe. This patient was seen in room TR08C/TR08C and the patient's care was started at 1652.   CSN: [id number]  Arrival date & time 11/22/12  1652   First MD Initiated Contact with Patient 11/22/12 1719      Chief Complaint  Patient presents with  . Rash     The history is provided by the patient. No language interpreter was used.    HPI Comments: [name] is a 67 y.o. male who presents to the Emergency Department complaining of an ongoing, constant, gradually worsening rash to L arm starting 1.5 weeks ago. Pt denies any itching or pain currently. States he has been in the woods recently but has not noticed any ticks or insect bites. Reports having applied rubbing alcohol at home. He denies any fevers, nausea, vomiting.   Past Medical History  Diagnosis Date  . Diverticulosis of colon   . GERD (gastroesophageal reflux disease)   . Gout   . Hypertension   . Hyperlipidemia   . Renal failure, acute     hx of 2/2 medication, resolved  . Barretts esophagus     bx distal esophagus 2011 neg   . Tobacco abuse   . Alcohol abuse   . Hx of adenomatous colonic polyps   . CHF (congestive heart failure)   . Hiatal hernia     2 cm noted on upper endos 2011   . Cataract   . Internal hemorrhoid   . DJD (degenerative joint disease)     left hip  . Myocardial infarction     hx of, 2 stents 2004. pt was on Plavix x 3 years then transitioned to ASA  . 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 Cataract     Past Surgical History  Procedure Laterality Date  . Lung lobectomy  11/07    RUL  . Flexible cystourethroscopy  05/24/06  . Other surgical history      hernia repair 2005   . Hernia repair    . Colonoscopy    . Upper gastrointestinal endoscopy      Family History  Problem Relation Age of Onset  .  Diabetes Mother   . Hypertension Sister   . Heart disease Sister   . Diabetes Sister   . Arthritis Brother   . Gout Brother   . Colon cancer Neg Hx   . Esophageal cancer Neg Hx   . Rectal cancer Neg Hx   . Stomach cancer Neg Hx     History  Substance Use Topics  . Smoking status: Current Every Day Smoker -- 0.50 packs/day    Types: Cigarettes  . Smokeless tobacco: Never Used     Comment: trying to cut down.  . Alcohol Use: Yes     Comment: drinking 1 pt gin qweek, (mixed with tap water not tonic)      Review of Systems A complete 10 system review of systems was obtained and all systems are negative except as noted in the HPI and PMH.   Allergies  Candesartan cilexetil-hctz and Hydrochlorothiazide  Home Medications   Current Outpatient Rx  Name  Route  Sig  Dispense  Refill  . allopurinol (ZYLOPRIM) 100 MG tablet   Oral   Take 1 tablet (100 mg total) by mouth daily.   30 tablet  5   . amLODipine (NORVASC) 10 MG tablet   Oral   Take 1 tablet (10 mg total) by mouth daily.   30 tablet   5   . aspirin EC 81 MG tablet   Oral   Take 1 tablet (81 mg total) by mouth at bedtime.   30 tablet   5   . colchicine 0.6 MG tablet   Oral   Take 0.6 mg by mouth daily. For 6 months; Start date 08/06/12         . furosemide (LASIX) 20 MG tablet   Oral   Take 1 tablet (20 mg total) by mouth daily.   30 tablet   5   . HYDROcodone-acetaminophen (NORCO) 7.5-325 MG per tablet   Oral   Take 1 tablet by mouth every 8 (eight) hours as needed for pain.         . metoprolol (LOPRESSOR) 50 MG tablet   Oral   Take 0.5 tablets (25 mg total) by mouth 2 (two) times daily.   60 tablet   5   . omeprazole (PRILOSEC) 20 MG capsule   Oral   Take 1 capsule (20 mg total) by mouth 2 (two) times daily.   30 capsule   5   . rosuvastatin (CRESTOR) 20 MG tablet   Oral   Take 1 tablet (20 mg total) by mouth daily.   30 tablet   5     BP 143/79  Pulse 82  Temp(Src) 98.2 F  (36.8 C) (Oral)  Resp 18  SpO2 95%  Physical Exam  Nursing note and vitals reviewed. Constitutional: He is oriented to person, place, and time. He appears well-developed and well-nourished. No distress.  HENT:  Head: Normocephalic and atraumatic.  Eyes: EOM are normal.  Neck: Neck supple. No tracheal deviation present.  Cardiovascular: Normal rate.   Pulmonary/Chest: Effort normal. No respiratory distress.  Musculoskeletal: Normal range of motion.  Neurological: He is alert and oriented to person, place, and time.  Skin: Skin is warm and dry.  Maculopapular rash to posterior left elbow. No discharge, no signs of infection or cellulitis.   Psychiatric: He has a normal mood and affect. His behavior is normal.    ED Course  Procedures (including critical care time)  DIAGNOSTIC STUDIES: Oxygen Saturation is 95% on room air, adequate by my interpretation.    COORDINATION OF CARE: 6:19 PM Discussed treatment plan with pt at bedside and pt agreed to plan.   Labs Reviewed - No data to display No results found.   1. Rash       MDM  Patient with rash, it is not vesicular, does not appear to be zoster, possibly could be poison ivy, as patient states that he has been out in the woods a lot lately. Will treat with hydrocortisone cream. He denies fevers, arthralgias, or myalgias. Denies any tick bites.      I personally performed the services described in this documentation, which was scribed in my presence. The recorded information has been reviewed and is accurate.    Roxy Horseman, PA-C 11/22/12 2326

## 2012-11-25 NOTE — ED Provider Notes (Signed)
Medical screening examination/treatment/procedure(s) were performed by non-physician practitioner and as supervising physician I was immediately available for consultation/collaboration.    Duey Liller R Emmelyn Schmale, MD 11/25/12 1121 

## 2012-11-25 NOTE — Telephone Encounter (Signed)
Called to pharm 

## 2012-12-03 ENCOUNTER — Encounter (HOSPITAL_COMMUNITY): Payer: Self-pay | Admitting: *Deleted

## 2012-12-03 ENCOUNTER — Emergency Department (HOSPITAL_COMMUNITY)
Admission: EM | Admit: 2012-12-03 | Discharge: 2012-12-04 | Disposition: A | Discharge status: Home / Self Care | Payer: Medicare HMO | Attending: Emergency Medicine | Admitting: Emergency Medicine

## 2012-12-03 DIAGNOSIS — Z860100 Personal history of colon polyps, unspecified: Secondary | ICD-10-CM | POA: Insufficient documentation

## 2012-12-03 DIAGNOSIS — Z8719 Personal history of other diseases of the digestive system: Secondary | ICD-10-CM | POA: Insufficient documentation

## 2012-12-03 DIAGNOSIS — K8020 Calculus of gallbladder without cholecystitis without obstruction: Secondary | ICD-10-CM

## 2012-12-03 DIAGNOSIS — Z87448 Personal history of other diseases of urinary system: Secondary | ICD-10-CM | POA: Insufficient documentation

## 2012-12-03 DIAGNOSIS — Z8669 Personal history of other diseases of the nervous system and sense organs: Secondary | ICD-10-CM | POA: Insufficient documentation

## 2012-12-03 DIAGNOSIS — Z9889 Other specified postprocedural states: Secondary | ICD-10-CM | POA: Insufficient documentation

## 2012-12-03 DIAGNOSIS — M1610 Unilateral primary osteoarthritis, unspecified hip: Secondary | ICD-10-CM | POA: Insufficient documentation

## 2012-12-03 DIAGNOSIS — K219 Gastro-esophageal reflux disease without esophagitis: Secondary | ICD-10-CM | POA: Insufficient documentation

## 2012-12-03 DIAGNOSIS — N400 Enlarged prostate without lower urinary tract symptoms: Secondary | ICD-10-CM

## 2012-12-03 DIAGNOSIS — I509 Heart failure, unspecified: Secondary | ICD-10-CM | POA: Insufficient documentation

## 2012-12-03 DIAGNOSIS — N390 Urinary tract infection, site not specified: Secondary | ICD-10-CM

## 2012-12-03 DIAGNOSIS — Z8679 Personal history of other diseases of the circulatory system: Secondary | ICD-10-CM | POA: Insufficient documentation

## 2012-12-03 DIAGNOSIS — I252 Old myocardial infarction: Secondary | ICD-10-CM | POA: Insufficient documentation

## 2012-12-03 DIAGNOSIS — M109 Gout, unspecified: Secondary | ICD-10-CM | POA: Insufficient documentation

## 2012-12-03 DIAGNOSIS — I714 Abdominal aortic aneurysm, without rupture, unspecified: Secondary | ICD-10-CM

## 2012-12-03 DIAGNOSIS — I10 Essential (primary) hypertension: Secondary | ICD-10-CM | POA: Insufficient documentation

## 2012-12-03 DIAGNOSIS — E785 Hyperlipidemia, unspecified: Secondary | ICD-10-CM | POA: Insufficient documentation

## 2012-12-03 DIAGNOSIS — Z85118 Personal history of other malignant neoplasm of bronchus and lung: Secondary | ICD-10-CM | POA: Insufficient documentation

## 2012-12-03 DIAGNOSIS — F17200 Nicotine dependence, unspecified, uncomplicated: Secondary | ICD-10-CM | POA: Insufficient documentation

## 2012-12-03 DIAGNOSIS — I2510 Atherosclerotic heart disease of native coronary artery without angina pectoris: Secondary | ICD-10-CM

## 2012-12-03 DIAGNOSIS — M169 Osteoarthritis of hip, unspecified: Secondary | ICD-10-CM | POA: Insufficient documentation

## 2012-12-03 DIAGNOSIS — Z79899 Other long term (current) drug therapy: Secondary | ICD-10-CM | POA: Insufficient documentation

## 2012-12-03 LAB — CBC WITH DIFFERENTIAL/PLATELET
Basophils Relative: 0 % (ref 0–1)
Eosinophils Absolute: 0.3 10*3/uL (ref 0.0–0.7)
HCT: 40.7 % (ref 39.0–52.0)
Hemoglobin: 14.1 g/dL (ref 13.0–17.0)
Lymphs Abs: 2.4 10*3/uL (ref 0.7–4.0)
MCH: 32.2 pg (ref 26.0–34.0)
MCHC: 34.6 g/dL (ref 30.0–36.0)
Monocytes Absolute: 0.4 10*3/uL (ref 0.1–1.0)
Monocytes Relative: 5 % (ref 3–12)
Neutro Abs: 4.7 10*3/uL (ref 1.7–7.7)

## 2012-12-03 LAB — COMPREHENSIVE METABOLIC PANEL
Albumin: 3.9 g/dL (ref 3.5–5.2)
BUN: 14 mg/dL (ref 6–23)
Chloride: 101 mEq/L (ref 96–112)
Creatinine, Ser: 1.32 mg/dL (ref 0.50–1.35)
GFR calc Af Amer: 63 mL/min — ABNORMAL LOW (ref >90)
GFR calc non Af Amer: 55 mL/min — ABNORMAL LOW (ref >90)
Glucose, Bld: 114 mg/dL — ABNORMAL HIGH (ref 70–99)
Total Bilirubin: 0.4 mg/dL (ref 0.3–1.2)

## 2012-12-03 NOTE — ED Notes (Signed)
Pt states he ate green peppers yesterday and today but was not suppose to eat them due to heart burn. Pt states pain is 8/10 in abdomen. Pt states she feels like he has acid reflux. Pt states she takes pain meds and has taken all his meds today except his 3 dose of pain medication. Pt is alert and oriented. No signs of distress noted.

## 2012-12-03 NOTE — ED Notes (Addendum)
Pt states that he thinks he has food poisoning. Pt states that he has a sensation not like burning but pain in his epigastric region.pt states he feels like he needs to vomit and also have a bowel movement. Pt states that he took 2 of his acid reflux medications and they did not help the pain in his stomach.

## 2012-12-04 ENCOUNTER — Emergency Department (HOSPITAL_COMMUNITY): Payer: Medicare HMO

## 2012-12-04 ENCOUNTER — Encounter (HOSPITAL_COMMUNITY): Payer: Self-pay | Admitting: Radiology

## 2012-12-04 LAB — CG4 I-STAT (LACTIC ACID): Lactic Acid, Venous: 2.33 mmol/L — ABNORMAL HIGH (ref 0.5–2.2)

## 2012-12-04 LAB — URINALYSIS, ROUTINE W REFLEX MICROSCOPIC
Glucose, UA: NEGATIVE mg/dL
Protein, ur: NEGATIVE mg/dL
pH: 6 (ref 5.0–8.0)

## 2012-12-04 LAB — LIPASE, BLOOD: Lipase: 212 U/L — ABNORMAL HIGH (ref 11–59)

## 2012-12-04 LAB — URINE MICROSCOPIC-ADD ON

## 2012-12-04 MED ORDER — IOHEXOL 300 MG/ML  SOLN
25.0000 mL | INTRAMUSCULAR | Status: AC
  Administered 2012-12-04: 25 mL via ORAL

## 2012-12-04 MED ORDER — SODIUM CHLORIDE 0.9 % IV BOLUS (SEPSIS)
500.0000 mL | Freq: Once | INTRAVENOUS | Status: AC
  Administered 2012-12-04: 500 mL via INTRAVENOUS

## 2012-12-04 MED ORDER — PANTOPRAZOLE SODIUM 40 MG IV SOLR
40.0000 mg | Freq: Once | INTRAVENOUS | Status: AC
  Administered 2012-12-04: 40 mg via INTRAVENOUS
  Filled 2012-12-04: qty 40

## 2012-12-04 MED ORDER — IOHEXOL 300 MG/ML  SOLN
100.0000 mL | Freq: Once | INTRAMUSCULAR | Status: AC | PRN
  Administered 2012-12-04: 100 mL via INTRAVENOUS

## 2012-12-04 MED ORDER — ACETAMINOPHEN 325 MG PO TABS
650.0000 mg | ORAL_TABLET | Freq: Once | ORAL | Status: AC
  Administered 2012-12-04: 650 mg via ORAL
  Filled 2012-12-04: qty 2

## 2012-12-04 MED ORDER — OXYCODONE-ACETAMINOPHEN 5-325 MG PO TABS: 1.0000 | ORAL_TABLET | ORAL | Status: DC | PRN

## 2012-12-04 MED ORDER — CEPHALEXIN 500 MG PO CAPS: 500.0000 mg | ORAL_CAPSULE | Freq: Four times a day (QID) | ORAL | Status: DC

## 2012-12-04 MED ORDER — ONDANSETRON 4 MG PO TBDP
8.0000 mg | ORAL_TABLET | Freq: Once | ORAL | Status: AC
  Administered 2012-12-04: 8 mg via ORAL
  Filled 2012-12-04: qty 2

## 2012-12-04 NOTE — ED Provider Notes (Signed)
History     CSN: [id number]  Arrival date & time 12/03/12  2113   First MD Initiated Contact with Patient 12/04/12 [phone]      Chief Complaint  Patient presents with  . Abdominal Pain     Patient is a 67 y.o. male presenting with abdominal pain. The history is provided by the patient.  Abdominal Pain This is a new problem. The current episode started yesterday. The problem occurs constantly. The problem has not changed since onset.Associated symptoms include abdominal pain. Pertinent negatives include no chest pain and no shortness of breath. The symptoms are aggravated by eating. Nothing relieves the symptoms. He has tried rest for the symptoms. The treatment provided no relief.    Past Medical History  Diagnosis Date  . Diverticulosis of colon   . GERD (gastroesophageal reflux disease)   . Gout   . Hypertension   . Hyperlipidemia   . Renal failure, acute     hx of 2/2 medication, resolved  . Barretts esophagus     bx distal esophagus 2011 neg   . Tobacco abuse   . Alcohol abuse   . Hx of adenomatous colonic polyps   . CHF (congestive heart failure)   . Hiatal hernia     2 cm noted on upper endos 2011   . Cataract   . Internal hemorrhoid   . DJD (degenerative joint disease)     left hip  . Myocardial infarction     hx of, 2 stents 2004. pt was on Plavix x 3 years then transitioned to ASA  . 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 Cataract     Past Surgical History  Procedure Laterality Date  . Lung lobectomy  11/07    RUL  . Flexible cystourethroscopy  05/24/06  . Other surgical history      hernia repair 2005   . Hernia repair    . Colonoscopy    . Upper gastrointestinal endoscopy      Family History  Problem Relation Age of Onset  . Diabetes Mother   . Hypertension Sister   . Heart disease Sister   . Diabetes Sister   . Arthritis Brother   . Gout Brother   . Colon cancer Neg Hx   . Esophageal cancer Neg Hx    . Rectal cancer Neg Hx   . Stomach cancer Neg Hx     History  Substance Use Topics  . Smoking status: Current Every Day Smoker -- 0.50 packs/day    Types: Cigarettes  . Smokeless tobacco: Never Used     Comment: trying to cut down.  . Alcohol Use: Yes     Comment: drinking 1 pt gin qweek, (mixed with tap water not tonic)      Review of Systems  Constitutional: Negative for fever.  Respiratory: Negative for shortness of breath.   Cardiovascular: Negative for chest pain.  Gastrointestinal: Positive for abdominal pain. Negative for vomiting.  Genitourinary: Negative for dysuria.  Musculoskeletal: Negative for back pain.  All other systems reviewed and are negative.    Allergies  Candesartan cilexetil-hctz and Hydrochlorothiazide  Home Medications   Current Outpatient Rx  Name  Route  Sig  Dispense  Refill  . allopurinol (ZYLOPRIM) 100 MG tablet   Oral   Take 1 tablet (100 mg total) by mouth daily.   30 tablet   5   . amLODipine (NORVASC) 10 MG tablet   Oral  Take 1 tablet (10 mg total) by mouth daily.   30 tablet   5   . aspirin EC 81 MG tablet   Oral   Take 1 tablet (81 mg total) by mouth at bedtime.   30 tablet   5   . colchicine 0.6 MG tablet   Oral   Take 0.6 mg by mouth daily. For 6 months; Start date 08/06/12         . furosemide (LASIX) 20 MG tablet   Oral   Take 1 tablet (20 mg total) by mouth daily.   30 tablet   5   . hydrocortisone cream 1 %      Apply to affected area 2 times daily   15 g   0   . metoprolol (LOPRESSOR) 50 MG tablet   Oral   Take 0.5 tablets (25 mg total) by mouth 2 (two) times daily.   60 tablet   5   . omeprazole (PRILOSEC) 20 MG capsule   Oral   Take 1 capsule (20 mg total) by mouth 2 (two) times daily.   30 capsule   5   . rosuvastatin (CRESTOR) 20 MG tablet   Oral   Take 1 tablet (20 mg total) by mouth daily.   30 tablet   5   . HYDROcodone-acetaminophen (NORCO) 7.5-325 MG per tablet   Oral    Take 1 tablet by mouth every 8 (eight) hours as needed for pain.           BP 133/70  Pulse 73  Temp(Src) 98.1 F (36.7 C) (Oral)  Resp 18  SpO2 95%  Physical Exam CONSTITUTIONAL: Well developed/well nourished HEAD: Normocephalic/atraumatic EYES: EOMI/PERRL ENMT: Mucous membranes moist NECK: supple no meningeal signs CV: S1/S2 noted, no murmurs/rubs/gallops noted LUNGS: Lungs are clear to auscultation bilaterally, no apparent distress ABDOMEN: soft, diffuse mild tenderness, no rebound or guarding, +BS noted GU:no cva tenderness NEURO: Pt is awake/alert, moves all extremitiesx4 EXTREMITIES: pulses normal, full ROM SKIN: warm, color normal PSYCH: no abnormalities of mood noted  ED Course  Procedures   Labs Reviewed  CBC WITH DIFFERENTIAL - Abnormal; Notable for the following:    Platelets 129 (*)    All other components within normal limits  COMPREHENSIVE METABOLIC PANEL - Abnormal; Notable for the following:    Glucose, Bld 114 (*)    AST 49 (*)    GFR calc non Af Amer 55 (*)    GFR calc Af Amer 63 (*)    All other components within normal limits   3:13 AM Pt with diffuse abdominal tenderness, abnormal labs, CT imaging ordered He thinks this is all due to eating green peppers he cooked  6:26 AM Ct imaging shows multiple findings Will treat for uti Also - called surgery dr Janee Morn and discussed ct findings of cholelithiasis and labs.  Since pt is improved, stable for d/c and can f/u next week in clinic Also noted to have AAA EPIC note sent to PCP to help coordinate followup Stable for d/c  MDM  Nursing notes including past medical history and social history reviewed and considered in documentation Labs/vital reviewed and considered        Date: 12/04/2012  Rate: 73  Rhythm: normal sinus rhythm  QRS Axis: left  Intervals: normal  ST/T Wave abnormalities: nonspecific ST changes  Conduction Disutrbances:first-degree A-V block   Narrative  Interpretation:   Old EKG Reviewed: unchanged    Joya Gaskins, MD 12/04/12 [phone]

## 2012-12-04 NOTE — ED Notes (Signed)
Patient transported to CT 

## 2012-12-04 NOTE — ED Notes (Signed)
CT notified pt finished drinking contrast  

## 2012-12-04 NOTE — ED Notes (Signed)
Pt given fluids for PO challenge.

## 2012-12-05 LAB — URINE CULTURE

## 2012-12-10 ENCOUNTER — Other Ambulatory Visit: Payer: Self-pay | Admitting: Internal Medicine

## 2012-12-17 ENCOUNTER — Encounter: Payer: Self-pay | Admitting: Internal Medicine

## 2012-12-17 ENCOUNTER — Ambulatory Visit (INDEPENDENT_AMBULATORY_CARE_PROVIDER_SITE_OTHER): Payer: Medicare HMO | Admitting: Internal Medicine

## 2012-12-17 VITALS — BP 126/83 | HR 68 | Temp 96.9°F | Ht 73.5 in | Wt 202.4 lb

## 2012-12-17 DIAGNOSIS — I714 Abdominal aortic aneurysm, without rupture, unspecified: Secondary | ICD-10-CM | POA: Insufficient documentation

## 2012-12-17 DIAGNOSIS — R109 Unspecified abdominal pain: Secondary | ICD-10-CM | POA: Insufficient documentation

## 2012-12-17 DIAGNOSIS — N390 Urinary tract infection, site not specified: Secondary | ICD-10-CM | POA: Insufficient documentation

## 2012-12-17 DIAGNOSIS — F17200 Nicotine dependence, unspecified, uncomplicated: Secondary | ICD-10-CM

## 2012-12-17 DIAGNOSIS — R1032 Left lower quadrant pain: Secondary | ICD-10-CM

## 2012-12-17 DIAGNOSIS — I10 Essential (primary) hypertension: Secondary | ICD-10-CM

## 2012-12-17 DIAGNOSIS — K8020 Calculus of gallbladder without cholecystitis without obstruction: Secondary | ICD-10-CM | POA: Insufficient documentation

## 2012-12-17 LAB — CBC WITH DIFFERENTIAL/PLATELET
Basophils Absolute: 0 10*3/uL (ref 0.0–0.1)
Eosinophils Absolute: 0.3 10*3/uL (ref 0.0–0.7)
Eosinophils Relative: 4 % (ref 0–5)
HCT: 43 % (ref 39.0–52.0)
Hemoglobin: 14.7 g/dL (ref 13.0–17.0)
MCH: 32.1 pg (ref 26.0–34.0)
MCHC: 34.2 g/dL (ref 30.0–36.0)
MCV: 93.9 fL (ref 78.0–100.0)
Monocytes Absolute: 0.5 10*3/uL (ref 0.1–1.0)
Platelets: 154 10*3/uL (ref 150–400)
RBC: 4.58 MIL/uL (ref 4.22–5.81)
RDW: 14.6 % (ref 11.5–15.5)

## 2012-12-17 LAB — COMPREHENSIVE METABOLIC PANEL
ALT: 32 U/L (ref 0–53)
AST: 40 U/L — ABNORMAL HIGH (ref 0–37)
Alkaline Phosphatase: 95 U/L (ref 39–117)
CO2: 28 mEq/L (ref 19–32)
Creat: 1.21 mg/dL (ref 0.50–1.35)
Sodium: 142 mEq/L (ref 135–145)
Total Bilirubin: 0.5 mg/dL (ref 0.3–1.2)
Total Protein: 7.9 g/dL (ref 6.0–8.3)

## 2012-12-17 NOTE — Assessment & Plan Note (Signed)
Rec. Stop drinking.  Patient could have passed a gallstone.  This could have been source of abdominal pain (now resolved) but no active cholecystitis noted on CT.   If symptomatic again will consider referral to general surgery

## 2012-12-17 NOTE — Assessment & Plan Note (Signed)
Previously tx'ed with Keflex.  Currently not symptomatic

## 2012-12-17 NOTE — Assessment & Plan Note (Signed)
Noted CT scan 11/2012 size 3.3 cm x 3.2 cm  Will repeat US abdomen 05/2013 if stable size think about surveillance. Up to date states for small rec. US abdomen every 2-3 years Will ask Dr. Arbie Cookey about how often to do surveillance  Disc. Stop smoking as he should anyway with history of lung cancer

## 2012-12-17 NOTE — Progress Notes (Signed)
  Subjective:    Patient ID: [name], male    DOB: April 19, 1946, 67 y.o.   MRN: [id number]  HPI Comments: 67 y.o male PMH CAD, lung cancer s/p lobectomy, tobacco abuse, ?alcohol abuse, HTN (BP today 99/65-->126/83), BPH, GERD, UTI (recently completed Keflex).  He presented to the ED 6/19 or 6/20 with abdominal pain had a CT done which showed 3.3 x 3.2 cm AAA, cholelithiasis, and enlarged prostate.  Patient states at the time he thought abdominal pain was due to GERD.  He stated Weds. Prior to going to the ED he ate ribeye steak with potatoes, bell peppers.  He thinks bell peppers worsen his reflux.    He denies abdominal pain today, denies nausea/vomiting, denies change in appetite, denies fever/chills, dysuria, difficulty peeing.  He is still drinking alcohol gin and water and still smoking 1 pk will last 3 days.    He has history of lung cancer and follows with Dr. Gwenyth Bouillon and has f/u coming up soon.       Review of Systems     Objective:   Physical Exam  Nursing note and vitals reviewed. Constitutional: He is oriented to person, place, and time. Vital signs are normal. He appears well-developed and well-nourished. He is cooperative.  HENT:  Head: Normocephalic and atraumatic.  Mouth/Throat: Oropharynx is clear and moist. He has dentures. No oropharyngeal exudate.  Eyes: Conjunctivae are normal. Pupils are equal, round, and reactive to light. Right eye exhibits no discharge. Left eye exhibits no discharge. No scleral icterus.  Cardiovascular: Normal rate, regular rhythm, S1 normal, S2 normal and normal heart sounds.   No murmur heard. Pulmonary/Chest: Effort normal and breath sounds normal.  Abdominal: Soft. Bowel sounds are normal. He exhibits no distension. There is no tenderness.  No abdominal bruits   Musculoskeletal: He exhibits no edema.  Neurological: He is alert and oriented to person, place, and time. Gait normal.  Skin: Skin is warm, dry and intact. No rash noted.   Psychiatric: He has a normal mood and affect. His speech is normal and behavior is normal. Judgment and thought content normal. Cognition and memory are normal.          Assessment & Plan:  Follow up in 02/2013 with Dr. Shirlee Latch  Stop smoking and drinking  Return to clinic or Emergency room if symptoms return and you have abdominal pain, nausea, vomiting, fever, chills

## 2012-12-17 NOTE — Assessment & Plan Note (Signed)
Counseled on importance of smoking cessation esp. With h/o lung cancer and AAA

## 2012-12-17 NOTE — Assessment & Plan Note (Signed)
Noted 11/2012 ED visit per patient it was in LLQ now resolved  At that time CT findings AAA, gallstones.  He also had elevated lipase 212 and lactic acid 2.33 Will repeat CMET, lipase, CBC, lactic acid today Advised to stop drinking and smoking

## 2012-12-17 NOTE — Patient Instructions (Addendum)
Follow up in 02/2013 with Dr. Shirlee Latch  Stop smoking and drinking Return to clinic or Emergency room if symptoms return and you have abdominal pain, nausea, vomiting, fever, chills   Abdominal Aortic Aneurysm  An aneurysm is the enlargement (dilatation), bulging, or ballooning out of part of the wall of a vein or artery. An aortic aneurysm is a bulging in the largest artery of the body. This artery supplies blood from the heart to the rest of the body.  The first part of the aorta is called the thoracic aorta. It leaves the heart, rises (ascends), arches, and goes down (descends) through the chest until it reaches the diaphragm. The diaphragm is the muscular part between the chest and abdomen.  The second part of the aorta is called the abdominal aorta after it has passed the diaphragm and continues down through the abdomen. The abdominal aorta ends where it splits to form the two iliac arteries that go to the legs. Aortic aneurysms can develop anywhere along the length of the aorta. The majority are located along the abdominal aorta. The major concern with an aortic aneurysm is that it can enlarge and rupture. This can cause death unless diagnosed and treated promptly. Aneurysms can also develop blood clots or infections. CAUSES  Many aortic aneurysms are caused by arteriosclerosis. Arteriosclerosis can weaken the aortic wall. The pressure of the blood being pumped through the aorta causes it to balloon out at the site of weakness. Therefore, high blood pressure (hypertension) is associated with aneurysm. Other risk factors include:  Age over 62.  Tobacco use.  Being male.  White race.  Family history of aneurysm.  Less frequent causes of abdominal aortic aneurysms include:  Connective tissue diseases.  Abdominal trauma.  Inflammation of blood vessles (arteritis).  Inherited (congenital) malformations.  Infection. SYMPTOMS  The signs and symptoms of an unruptured aneurysm will  partly depend on its size and rate of growth.   Abdominal aortic aneurysms may cause pain. The pain typically has a deep quality as if it is piercing into the person. It is felt most often in the lower back area. The pain is usually steady but may be relieved by changing your body position.  The person may also become aware of an abnormally prominent pulse in the belly (abdominal pulsation). DIAGNOSIS  An aortic aneurysm may be discovered by chance on physical exam, or on X-ray studies done for other reasons. It may be suspected because of other problems such as back or abdominal pain. The following tests may help identify the problem.  X-rays of the abdomen can show calcium deposits in the aneurysm wall.  CT scanning of the abdomen, particularly with contrast medium, is accurate at showing the exact size and shape of the aneurysm.  Ultrasounds give a clear picture of the size of an aneurysm (about 98% accuracy).  MRI scanning is accurate, but often unnecessary.  An abdominal angiogram shows the source of the major blood vessels arising from the aorta. It reveals the size and extent of any aneurysm. It can also show a clot clinging to the wall of the aneurysm (mural thrombus). TREATMENT  Treating an abdominal aortic aneurysm depends on the size. A rupture of an aneurysm is uncommon when they are less than 5 cm wide (2 inches). Rupture is far more common in aneurysms that are over 6 cm wide (2.4 inches).  Surgical repair is usually recommended for all aneurysms over 6 cm wide (2.4 inches). This depends on the health, age,  and other circumstances of the individual. This type of surgery consists of opening the abdomen, removing the aneurysm, and sewing a synthetic graft (similar to a cloth tube) in its place. A less invasive form of this surgery, using stent grafts, is sometimes recommended.  For most patients, elective repair is recommended for aneurysms between 4 and 6 cm (1.6 and 2.4 inches).  Elective means the surgery can be done at your convenience. This should not be put off too long if surgery is recommended.  If you smoke, stop immediately. Smoking is a major risk factor for enlargement and rupture.  Medications may be used to help decrease complications  these include medicine to lower blood pressure and control cholesterol. HOME CARE INSTRUCTIONS   If you smoke, stop. Do not start smoking.  Take all medications as prescribed.  Your caregiver will tell you when to have your aneurysm rechecked, either by ultrasound or CT scan.  If your caregiver has given you a follow-up appointment, it is very important to keep that appointment. Not keeping the appointment could result in a chronic or permanent injury, pain, or disability. If there is any problem keeping the appointment, you must call back to this facility for assistance. SEEK MEDICAL CARE IF:   You develop mild abdominal pain or pressure.  You are able to feel or perceive your aneurysm, and you sense any change. SEEK IMMEDIATE MEDICAL CARE IF:   You develop severe abdominal pain, or severe pain moving (radiating) to your back.  You suddenly develop cold or blue toes or feet.  You suddenly develop lightheadedness or fainting spells. MAKE SURE YOU:   Understand these instructions.  Will watch your condition.  Will get help right away if you are not doing well or get worse. Document Released: 03/13/2005 Document Revised: 08/26/2011 Document Reviewed: 01/05/2008 [facility] Patient Information 2014 [place], [place].  Cholelithiasis Cholelithiasis (also called gallstones) is a form of gallbladder disease where gallstones form in your gallbladder. The gallbladder is a non-essential organ that stores bile made in the liver, which helps digest fats. Gallstones begin as small crystals and slowly grow into stones. Gallstone pain occurs when the gallbladder spasms, and a gallstone is blocking the duct. Pain can also occur  when a stone passes out of the duct.  Women are more likely to develop gallstones than men. Other factors that increase the risk of gallbladder disease are:  Having multiple pregnancies. Physicians sometimes advise removing diseased gallbladders before future pregnancies.  Obesity.  Diets heavy in fried foods and fat.  Increasing age (older than 88).  Prolonged use of medications containing male hormones.  Diabetes mellitus.  Rapid weight loss.  Family history of gallstones (heredity). SYMPTOMS  Feeling sick to your stomach (nauseous).  Abdominal pain.  Yellowing of the skin (jaundice).  Sudden pain. It may persist from several minutes to several hours.  Worsening pain with deep breathing or when jarred.  Fever.  Tenderness to the touch. In some cases, when gallstones do not move into the bile duct, people have no pain or symptoms. These are called "silent" gallstones. TREATMENT In severe cases, emergency surgery may be required. HOME CARE INSTRUCTIONS   Only take over-the-counter or prescription medicines for pain, discomfort, or fever as directed by your caregiver.  Follow a low-fat diet until seen again. Fat causes the gallbladder to contract, which can result in pain.  Follow up as instructed. Attacks are almost always recurrent and surgery is usually required for permanent treatment. SEEK IMMEDIATE MEDICAL CARE IF:  Your pain increases and is not controlled by medications.  You have an oral temperature above 102 F (38.9 C), not controlled by medication.  You develop nausea and vomiting. MAKE SURE YOU:   Understand these instructions.  Will watch your condition.  Will get help right away if you are not doing well or get worse. Document Released: 05/30/2005 Document Revised: 08/26/2011 Document Reviewed: 08/02/2010 [facility] Patient Information 2014 [place], [place].

## 2012-12-17 NOTE — Assessment & Plan Note (Signed)
Controlled continue current medications  Will see again 02/2013

## 2012-12-17 NOTE — Progress Notes (Signed)
Case discussed with Dr. McLean at the time of the visit.  We reviewed the resident's history and exam and pertinent patient test results.  I agree with the assessment, diagnosis, and plan of care documented in the resident's note.     

## 2013-01-22 ENCOUNTER — Ambulatory Visit (HOSPITAL_COMMUNITY)
Admission: RE | Admit: 2013-01-22 | Discharge: 2013-01-22 | Disposition: A | Discharge status: Home / Self Care | Payer: Medicare HMO | Source: Ambulatory Visit | Attending: Internal Medicine | Admitting: Internal Medicine

## 2013-01-22 ENCOUNTER — Other Ambulatory Visit (HOSPITAL_BASED_OUTPATIENT_CLINIC_OR_DEPARTMENT_OTHER): Payer: Medicare HMO | Admitting: Lab

## 2013-01-22 DIAGNOSIS — C3410 Malignant neoplasm of upper lobe, unspecified bronchus or lung: Secondary | ICD-10-CM

## 2013-01-22 DIAGNOSIS — I2510 Atherosclerotic heart disease of native coronary artery without angina pectoris: Secondary | ICD-10-CM | POA: Insufficient documentation

## 2013-01-22 DIAGNOSIS — K8020 Calculus of gallbladder without cholecystitis without obstruction: Secondary | ICD-10-CM | POA: Insufficient documentation

## 2013-01-22 DIAGNOSIS — I700 Atherosclerosis of aorta: Secondary | ICD-10-CM | POA: Insufficient documentation

## 2013-01-22 DIAGNOSIS — C3490 Malignant neoplasm of unspecified part of unspecified bronchus or lung: Secondary | ICD-10-CM

## 2013-01-22 DIAGNOSIS — N289 Disorder of kidney and ureter, unspecified: Secondary | ICD-10-CM | POA: Insufficient documentation

## 2013-01-22 DIAGNOSIS — K7689 Other specified diseases of liver: Secondary | ICD-10-CM | POA: Insufficient documentation

## 2013-01-22 DIAGNOSIS — Z902 Acquired absence of lung [part of]: Secondary | ICD-10-CM | POA: Insufficient documentation

## 2013-01-22 DIAGNOSIS — J438 Other emphysema: Secondary | ICD-10-CM | POA: Insufficient documentation

## 2013-01-22 LAB — CBC WITH DIFFERENTIAL/PLATELET
BASO%: 0.6 % (ref 0.0–2.0)
Eosinophils Absolute: 0.2 10*3/uL (ref 0.0–0.5)
HCT: 43.6 % (ref 38.4–49.9)
LYMPH%: 24.7 % (ref 14.0–49.0)
MCHC: 33.1 g/dL (ref 32.0–36.0)
MCV: 95.9 fL (ref 79.3–98.0)
MONO%: 7.7 % (ref 0.0–14.0)
NEUT%: 64.3 % (ref 39.0–75.0)
Platelets: 163 10*3/uL (ref 140–400)
RBC: 4.55 10*6/uL (ref 4.20–5.82)

## 2013-01-22 LAB — COMPREHENSIVE METABOLIC PANEL
Alkaline Phosphatase: 88 U/L (ref 39–117)
CO2: 28 mEq/L (ref 19–32)
Creatinine, Ser: 1.31 mg/dL (ref 0.50–1.35)
Glucose, Bld: 106 mg/dL — ABNORMAL HIGH (ref 70–99)
Sodium: 140 mEq/L (ref 135–145)
Total Bilirubin: 0.4 mg/dL (ref 0.3–1.2)
Total Protein: 7.5 g/dL (ref 6.0–8.3)

## 2013-01-26 ENCOUNTER — Ambulatory Visit (HOSPITAL_BASED_OUTPATIENT_CLINIC_OR_DEPARTMENT_OTHER): Payer: Commercial Managed Care - HMO | Admitting: Internal Medicine

## 2013-01-26 ENCOUNTER — Encounter: Payer: Self-pay | Admitting: Internal Medicine

## 2013-01-26 ENCOUNTER — Telehealth: Payer: Self-pay | Admitting: Internal Medicine

## 2013-01-26 VITALS — BP 129/74 | HR 69 | Temp 97.6°F | Resp 18 | Ht 73.0 in | Wt 200.5 lb

## 2013-01-26 DIAGNOSIS — C3490 Malignant neoplasm of unspecified part of unspecified bronchus or lung: Secondary | ICD-10-CM

## 2013-01-26 DIAGNOSIS — R0602 Shortness of breath: Secondary | ICD-10-CM

## 2013-01-26 DIAGNOSIS — C3410 Malignant neoplasm of upper lobe, unspecified bronchus or lung: Secondary | ICD-10-CM

## 2013-01-26 NOTE — Progress Notes (Signed)
[facility] [phone]   [phone]  OFFICE PROGRESS NOTE  Annett Gula, MD [address] [place] [address]  DIAGNOSIS: Stage IB (T2, N0, M0) non-small cell lung cancer diagnosed in November of 2007   PRIOR THERAPY: Status post right upper lobectomy with mediastinal lymph node dissection under the care of Dr. Dorris Fetch on 04/28/2006. The tumor size was 8 CM but the patient refused adjuvant chemotherapy at that time.   CURRENT THERAPY: Observation.  INTERVAL HISTORY: [name] 67 y.o. male returns to the clinic today for annual followup visit. The patient is feeling fine today with no specific complaints. He denied having any significant chest pain but continues to have shortness breath with exertion, no cough or hemoptysis. He has no significant weight loss or night sweats. The patient had repeat CT scan of the chest performed recently and he is here for evaluation and discussion of his scan results.  MEDICAL HISTORY: Past Medical History  Diagnosis Date  . Diverticulosis of colon   . GERD (gastroesophageal reflux disease)   . Gout   . Hypertension   . Hyperlipidemia   . Renal failure, acute     hx of 2/2 medication, resolved  . Barretts esophagus     bx distal esophagus 2011 neg   . Tobacco abuse   . Alcohol abuse   . Hx of adenomatous colonic polyps   . CHF (congestive heart failure)   . Hiatal hernia     2 cm noted on upper endos 2011   . Cataract   . Internal hemorrhoid   . DJD (degenerative joint disease)     left hip  . Myocardial infarction     hx of, 2 stents 2004. pt was on Plavix x 3 years then transitioned to ASA  . 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 Cataract   . Cholelithiasis   . UTI (lower urinary tract infection)   . AAA (abdominal aortic aneurysm)     noted CT 11/2012 size 3.3 x 3.2 cm    ALLERGIES:  is allergic to candesartan cilexetil-hctz and  hydrochlorothiazide.  MEDICATIONS:  Current Outpatient Prescriptions  Medication Sig Dispense Refill  . allopurinol (ZYLOPRIM) 100 MG tablet Take 1 tablet (100 mg total) by mouth daily.  30 tablet  5  . amLODipine (NORVASC) 10 MG tablet Take 1 tablet (10 mg total) by mouth daily.  30 tablet  5  . aspirin EC 81 MG tablet Take 1 tablet (81 mg total) by mouth at bedtime.  30 tablet  5  . colchicine 0.6 MG tablet Take 0.6 mg by mouth daily. For 6 months; Start date 08/06/12      . furosemide (LASIX) 20 MG tablet Take 1 tablet (20 mg total) by mouth daily.  30 tablet  5  . HYDROcodone-acetaminophen (NORCO) 7.5-325 MG per tablet Take 1 tablet by mouth every 8 (eight) hours as needed for pain.      . metoprolol (LOPRESSOR) 50 MG tablet Take 0.5 tablets (25 mg total) by mouth 2 (two) times daily.  60 tablet  5  . omeprazole (PRILOSEC) 20 MG capsule Take 1 capsule (20 mg total) by mouth 2 (two) times daily.  30 capsule  5  . rosuvastatin (CRESTOR) 20 MG tablet Take 1 tablet (20 mg total) by mouth daily.  30 tablet  5   No current facility-administered medications for this visit.  SURGICAL HISTORY:  Past Surgical History  Procedure Laterality Date  . Lung lobectomy  11/07    RUL  . Flexible cystourethroscopy  05/24/06  . Other surgical history      hernia repair 2005   . Hernia repair    . Colonoscopy    . Upper gastrointestinal endoscopy      REVIEW OF SYSTEMS:  A comprehensive review of systems was negative except for: Respiratory: positive for dyspnea on exertion   PHYSICAL EXAMINATION: General appearance: alert, cooperative and no distress Head: Normocephalic, without obvious abnormality, atraumatic Neck: no adenopathy Lymph nodes: Cervical, supraclavicular, and axillary nodes normal. Resp: clear to auscultation bilaterally Cardio: regular rate and rhythm, S1, S2 normal, no murmur, click, rub or gallop GI: soft, non-tender; bowel sounds normal; no masses,  no  organomegaly Extremities: extremities normal, atraumatic, no cyanosis or edema  ECOG PERFORMANCE STATUS: 1 - Symptomatic but completely ambulatory  Blood pressure 129/74, pulse 69, temperature 97.6 F (36.4 C), temperature source Oral, resp. rate 18, height 6\' 1"  (1.854 m), weight 200 lb 8 oz (90.946 kg).  LABORATORY DATA: Lab Results  Component Value Date   WBC 6.1 01/22/2013   HGB 14.4 01/22/2013   HCT 43.6 01/22/2013   MCV 95.9 01/22/2013   PLT 163 01/22/2013      Chemistry      Component Value Date/Time   NA 140 01/22/2013 0800   NA 143 01/25/2011 0901   K 3.7 01/22/2013 0800   K 4.2 01/25/2011 0901   CL 102 01/22/2013 0800   CL 101 01/25/2011 0901   CO2 28 01/22/2013 0800   CO2 26 01/25/2011 0901   BUN 16 01/22/2013 0800   BUN 15 01/25/2011 0901   CREATININE 1.31 01/22/2013 0800   CREATININE 1.21 12/17/2012 1605      Component Value Date/Time   CALCIUM 9.5 01/22/2013 0800   CALCIUM 9.1 01/25/2011 0901   ALKPHOS 88 01/22/2013 0800   ALKPHOS 93* 01/25/2011 0901   AST 44* 01/22/2013 0800   AST 35 01/25/2011 0901   ALT 34 01/22/2013 0800   ALT 21 01/25/2011 0901   BILITOT 0.4 01/22/2013 0800   BILITOT 0.60 01/25/2011 0901       RADIOGRAPHIC STUDIES: Ct Chest Wo Contrast  01/22/2013   *RADIOLOGY REPORT*  Clinical Data: Lung cancer.  Status post right upper lobectomy. Shortness of breath with exertion.  CT CHEST WITHOUT CONTRAST  Technique:  Multidetector CT imaging of the chest was performed following the standard protocol without IV contrast.  Comparison: Chest CT 01/27/2012.  Findings:  Mediastinum: Heart size is normal. There is no significant pericardial fluid, thickening or pericardial calcification. There is atherosclerosis of the thoracic aorta, the great vessels of the mediastinum and the coronary arteries, including calcified atherosclerotic plaque in the left main, left anterior descending, left circumflex and right coronary arteries. Curvilinear areas of hypoattenuation throughout the left  ventricular myocardium are compatible with areas of fibrofatty metaplasia in the subendocardium related to prior myocardial infarctions.  No definite ventricular aneurysm is identified at this time. No pathologically enlarged mediastinal or hilar lymph nodes. Please note that accurate exclusion of hilar adenopathy is limited on noncontrast CT scans.  Esophagus is unremarkable in appearance.  Lungs/Pleura: Status post right upper lobectomy.  Compensatory hyperexpansion of the right middle and lower lobes.  Mild centrilobular emphysema.  No suspicious appearing pulmonary nodules or masses are identified at this time.  No acute consolidative air space disease.  Upper Abdomen: Hypoattenuation throughout the hepatic parenchyma,  compatible with hepatic steatosis.  1.6 cm calcified gallstone in the gallbladder.  Gallbladder is completely contracted.  No surrounding inflammatory changes to strongly suggest acute cholecystitis at this time.  Incompletely visualized 3.0 cm low attenuation lesion in the medial aspect of the right kidney.  1.9 cm hyperdense lesion extending exophytically off the upper pole of the left kidney is unchanged, favored to represent a small hemorrhagic or proteinaceous cyst.  Musculoskeletal: There are no aggressive appearing lytic or blastic lesions noted in the visualized portions of the skeleton.  Post thoracotomy changes in the right hemithorax redemonstrated.  IMPRESSION: 1.  Status post right upper lobectomy without evidence to suggest local recurrence of disease or new metastatic disease in the thorax on today's examination. 2.  Mild centrilobular emphysema. 3. Atherosclerosis, including left main and three-vessel coronary artery disease.   Additionally, there is evidence of multiple old subendocardial myocardial infarctions, as above.  No left ventricular aneurysm noted at this time. 4.  Hepatic steatosis. 5.  Cholelithiasis with no definitive evidence of acute cholecystitis at this time. 6.   Renal lesions, as above, similar to prior studies, compatible with simple and proteinaceous/hemorrhagic cysts.   Original Report Authenticated By: Trudie Reed, M.D.    ASSESSMENT AND PLAN: This is a very pleasant 67 years old African American male with history of stage IB non-small cell lung cancer status post right upper lobectomy with mediastinal lymph node dissection and has been observation for the last 7 years with no significant evidence for disease recurrence. I discussed the scan results with the patient today.  I recommended for him to continue on observation with repeat CT scan of the chest without contrast in one year. He was advised to call immediately she has any concerning symptoms in the interval.  The patient voices understanding of current disease status and treatment options and is in agreement with the current care plan.  All questions were answered. The patient knows to call the clinic with any problems, questions or concerns. We can certainly see the patient much sooner if necessary.

## 2013-01-26 NOTE — Telephone Encounter (Signed)
gave pt appt for lab, CT before MD on August 2015

## 2013-01-26 NOTE — Patient Instructions (Signed)
No evidence for disease recurrence on the recent scan.  Followup visit in one year.

## 2013-01-27 ENCOUNTER — Ambulatory Visit: Payer: Medicare HMO | Admitting: Internal Medicine

## 2013-01-28 ENCOUNTER — Other Ambulatory Visit: Payer: Self-pay | Admitting: Internal Medicine

## 2013-02-11 ENCOUNTER — Other Ambulatory Visit: Payer: Self-pay | Admitting: Internal Medicine

## 2013-02-18 ENCOUNTER — Ambulatory Visit (INDEPENDENT_AMBULATORY_CARE_PROVIDER_SITE_OTHER): Payer: Medicare HMO | Admitting: Internal Medicine

## 2013-02-18 ENCOUNTER — Encounter: Payer: Self-pay | Admitting: Internal Medicine

## 2013-02-18 VITALS — BP 111/76 | HR 75 | Temp 97.6°F | Ht 73.5 in | Wt 198.0 lb

## 2013-02-18 DIAGNOSIS — I10 Essential (primary) hypertension: Secondary | ICD-10-CM

## 2013-02-18 DIAGNOSIS — I714 Abdominal aortic aneurysm, without rupture, unspecified: Secondary | ICD-10-CM

## 2013-02-18 DIAGNOSIS — E785 Hyperlipidemia, unspecified: Secondary | ICD-10-CM

## 2013-02-18 DIAGNOSIS — I2510 Atherosclerotic heart disease of native coronary artery without angina pectoris: Secondary | ICD-10-CM

## 2013-02-18 DIAGNOSIS — Z23 Encounter for immunization: Secondary | ICD-10-CM

## 2013-02-18 DIAGNOSIS — F17200 Nicotine dependence, unspecified, uncomplicated: Secondary | ICD-10-CM

## 2013-02-18 DIAGNOSIS — Z0000 Encounter for general adult medical examination without abnormal findings: Secondary | ICD-10-CM

## 2013-02-18 DIAGNOSIS — C3490 Malignant neoplasm of unspecified part of unspecified bronchus or lung: Secondary | ICD-10-CM

## 2013-02-18 LAB — LIPID PANEL
Cholesterol: 112 mg/dL (ref 0–200)
LDL Cholesterol: 47 mg/dL (ref 0–99)
Total CHOL/HDL Ratio: 2.1 Ratio
Triglycerides: 53 mg/dL (ref <150)
VLDL: 11 mg/dL (ref 0–40)

## 2013-02-18 MED ORDER — AMLODIPINE BESYLATE 10 MG PO TABS: 5.0000 mg | ORAL_TABLET | Freq: Every day | ORAL | Status: DC

## 2013-02-18 MED ORDER — AMLODIPINE BESYLATE 5 MG PO TABS: 5.0000 mg | ORAL_TABLET | Freq: Every day | ORAL | Status: DC

## 2013-02-18 NOTE — Assessment & Plan Note (Signed)
BP Readings from Last 3 Encounters:  02/18/13 111/76  01/26/13 129/74  12/17/12 126/83    Lab Results  Component Value Date   NA 140 01/22/2013   K 3.7 01/22/2013   CREATININE 1.31 01/22/2013    Assessment: Blood pressure control: controlled Progress toward BP goal:  at goal Comments: slightly hypotensive but not symptomatic   Plan: Medications:  continue current medications except decreased Norvasc 10 mg to 5 mg, Continue Lasix 20 mg qd, Lopressor 25 mg bid, statin, Asprin Educational resources provided: Education officer, environmental tools provided: other (see comments) Other plans: lipid panel today (fasting)

## 2013-02-18 NOTE — Assessment & Plan Note (Addendum)
Will check lipid panel today, flu vac today

## 2013-02-18 NOTE — Assessment & Plan Note (Signed)
Continue BB, statin, aspirin, allergic to ARB

## 2013-02-18 NOTE — Assessment & Plan Note (Signed)
Recently saw oncology doing well rec. CT scan to follow in the future

## 2013-02-18 NOTE — Assessment & Plan Note (Signed)
  Assessment: Progress toward smoking cessation:  smoking less (smoking 6-7 cig per day) Barriers to progress toward smoking cessation:   (duration) Comments: n/a  Plan: Instruction/counseling given:  I counseled patient on the dangers of tobacco use, advised patient to stop smoking, and reviewed strategies to maximize success. Educational resources provided:  Quitline[employer]er, jewellery) brochure Self management tools provided:  other (see comments) Medications to assist with smoking cessation:  None Patient agreed to the following self-care plans for smoking cessation:    Other plans: encouraged cessation esp with h/o lung cancer

## 2013-02-18 NOTE — Assessment & Plan Note (Signed)
3.3 x 3.2 cm  Will repeat US ab 05/2013 to check size Patient still smoking encouraged cessation

## 2013-02-18 NOTE — Assessment & Plan Note (Signed)
Lipid Panel     Component Value Date/Time   CHOL 158 01/10/2012 1643   TRIG 62 01/10/2012 1643   HDL 46 01/10/2012 1643   CHOLHDL 3.4 01/10/2012 1643   VLDL 12 01/10/2012 1643   LDLCALC 100* 01/10/2012 1643   Pending lipid panel

## 2013-02-18 NOTE — Progress Notes (Signed)
  Subjective:    Patient ID: [name], male    DOB: 01/20/1946, 67 y.o.   MRN: [id number]  HPI Comments: 67 y.o PMH AAA (needs repeat Ab [place] 05/2013 to check size), barretts esophagus, CAD, lung cancer s/p treatment follows with Dr. Gwenyth Bouillon currently in remission, gallstones, GERD, ED, gout, HLD (LDL 100 12/2011), CAD with h/o MI s/p stents, HTN (BP 97/68 HR 79 sitting, 110/71 HR 77 sitting, 102/73.  Repeat BP were 115/75 HR 75, 105/70 HR 75, 111/76 HR 75.  He presents for annual f/u. He is feeling well.     HM: due for flu and lipid (he is fasting today)     Review of Systems  Respiratory: Negative for shortness of breath.   Cardiovascular: Negative for chest pain.  Gastrointestinal: Negative for abdominal pain and constipation.  Neurological: Negative for dizziness and light-headedness.       Objective:   Physical Exam  Nursing note and vitals reviewed. Constitutional: He is oriented to person, place, and time. Vital signs are normal. He appears well-developed and well-nourished. He is cooperative.  HENT:  Head: Normocephalic and atraumatic.  Mouth/Throat: He has dentures. No oropharyngeal exudate.  Eyes: Conjunctivae are normal. Pupils are equal, round, and reactive to light. Right eye exhibits no discharge. Left eye exhibits no discharge. No scleral icterus.  Cardiovascular: Normal rate, regular rhythm, S1 normal, S2 normal and normal heart sounds.   No murmur heard. Pulmonary/Chest: Effort normal and breath sounds normal.  Abdominal: Soft. Bowel sounds are normal. He exhibits no distension. There is no tenderness.  Musculoskeletal: He exhibits no edema.  Neurological: He is alert and oriented to person, place, and time. Gait normal.  Skin: Skin is warm, dry and intact. No rash noted.  Psychiatric: He has a normal mood and affect. His speech is normal and behavior is normal. Judgment and thought content normal. Cognition and memory are normal.          Assessment &  Plan:  F/u in 3 months; check [place] ab AAA to monitor size

## 2013-02-18 NOTE — Patient Instructions (Addendum)
General Instructions: Please follow up in 3 months at that time we will need to do a Ultrasound of your abdomen to make sure your blood vessel is stable  Take 5 mg of Norvasc instead of 10 mg  Try to stop smoking   Treatment Goals:  Goals (1 Years of Data) as of 02/18/13   None      Progress Toward Treatment Goals:  Treatment Goal 02/18/2013  Blood pressure at goal  Stop smoking smoking less    Self Care Goals & Plans:  Self Care Goal 02/18/2013  Manage my medications take my medicines as prescribed; bring my medications to every visit; refill my medications on time  Monitor my health keep track of my blood pressure  Eat healthy foods drink diet soda or water instead of juice or soda; eat more vegetables; eat foods that are low in salt; eat baked foods instead of fried foods; eat fruit for snacks and desserts; eat smaller portions  Be physically active find an activity I enjoy  Meeting treatment goals maintain the current self-care plan       Care Management & Community Referrals:  Referral 02/18/2013  Referrals made for care management support none needed  Referrals made to community resources none       DASH Diet The DASH diet stands for "Dietary Approaches to Stop Hypertension." It is a healthy eating plan that has been shown to reduce high blood pressure (hypertension) in as little as 14 days, while also possibly providing other significant health benefits. These other health benefits include reducing the risk of breast cancer after menopause and reducing the risk of type 2 diabetes, heart disease, colon cancer, and stroke. Health benefits also include weight loss and slowing kidney failure in patients with chronic kidney disease.  DIET GUIDELINES  Limit salt (sodium). Your diet should contain less than 1500 mg of sodium daily.  Limit refined or processed carbohydrates. Your diet should include mostly whole grains. Desserts and added sugars should be used  sparingly.  Include small amounts of heart-healthy fats. These types of fats include nuts, oils, and tub margarine. Limit saturated and trans fats. These fats have been shown to be harmful in the body. CHOOSING FOODS  The following food groups are based on a 2000 calorie diet. See your Registered Dietitian for individual calorie needs. Grains and Grain Products (6 to 8 servings daily)  Eat More Often: Whole-wheat bread, brown rice, whole-grain or wheat pasta, quinoa, popcorn without added fat or salt (air popped).  Eat Less Often: White bread, white pasta, white rice, cornbread. Vegetables (4 to 5 servings daily)  Eat More Often: Fresh, frozen, and canned vegetables. Vegetables may be raw, steamed, roasted, or grilled with a minimal amount of fat.  Eat Less Often/Avoid: Creamed or fried vegetables. Vegetables in a cheese sauce. Fruit (4 to 5 servings daily)  Eat More Often: All fresh, canned (in natural juice), or frozen fruits. Dried fruits without added sugar. One hundred percent fruit juice ( cup [237 mL] daily).  Eat Less Often: Dried fruits with added sugar. Canned fruit in light or heavy syrup. [employer], Fish, and Poultry (2 servings or less daily. One serving is 3 to 4 oz [85-114 g]).  Eat More Often: Ninety percent or leaner ground beef, tenderloin, sirloin. Round cuts of beef, chicken breast, [place] breast. All fish. Grill, bake, or broil your meat. Nothing should be fried.  Eat Less Often/Avoid: Fatty cuts of meat, [place], or chicken leg, thigh, or wing. [place]  cuts of meat or fish. Dairy (2 to 3 servings)  Eat More Often: Low-fat or fat-free milk, low-fat plain or light yogurt, reduced-fat or part-skim cheese.  Eat Less Often/Avoid: Milk (whole, 2%).Whole milk yogurt. Full-fat cheeses. Nuts, Seeds, and Legumes (4 to 5 servings per week)  Eat More Often: All without added salt.  Eat Less Often/Avoid: Salted nuts and seeds, canned beans with added salt. Fats and Sweets  (limited)  Eat More Often: Vegetable oils, tub margarines without trans fats, sugar-free gelatin. Mayonnaise and salad dressings.  Eat Less Often/Avoid: Coconut oils, palm oils, butter, stick margarine, cream, half and half, cookies, candy, pie. FOR MORE INFORMATION The Dash Diet Eating Plan: www.dashdiet.org Document Released: 05/23/2011 Document Revised: 08/26/2011 Document Reviewed: 05/23/2011 [facility] Patient Information 2014 [place], [place].  Hypertension As your heart beats, it forces blood through your arteries. This force is your blood pressure. If the pressure is too high, it is called hypertension (HTN) or high blood pressure. HTN is dangerous because you may have it and not know it. High blood pressure may mean that your heart has to work harder to pump blood. Your arteries may be narrow or stiff. The extra work puts you at risk for heart disease, stroke, and other problems.  Blood pressure consists of two numbers, a higher number over a lower, 110/72, for example. It is stated as "110 over 72." The ideal is below 120 for the top number (systolic) and under 80 for the bottom (diastolic). Write down your blood pressure today. You should pay close attention to your blood pressure if you have certain conditions such as:  Heart failure.  Prior heart attack.  Diabetes  Chronic kidney disease.  Prior stroke.  Multiple risk factors for heart disease. To see if you have HTN, your blood pressure should be measured while you are seated with your arm held at the level of the heart. It should be measured at least twice. A one-time elevated blood pressure reading (especially in the Emergency Department) does not mean that you need treatment. There may be conditions in which the blood pressure is different between your right and left arms. It is important to see your caregiver soon for a recheck. Most people have essential hypertension which means that there is not a specific cause. This type  of high blood pressure may be lowered by changing lifestyle factors such as:  Stress.  Smoking.  Lack of exercise.  Excessive weight.  Drug/tobacco/alcohol use.  Eating less salt. Most people do not have symptoms from high blood pressure until it has caused damage to the body. Effective treatment can often prevent, delay or reduce that damage. TREATMENT  When a cause has been identified, treatment for high blood pressure is directed at the cause. There are a large number of medications to treat HTN. These fall into several categories, and your caregiver will help you select the medicines that are best for you. Medications may have side effects. You should review side effects with your caregiver. If your blood pressure stays high after you have made lifestyle changes or started on medicines,   Your medication(s) may need to be changed.  Other problems may need to be addressed.  Be certain you understand your prescriptions, and know how and when to take your medicine.  Be sure to follow up with your caregiver within the time frame advised (usually within two weeks) to have your blood pressure rechecked and to review your medications.  If you are taking more than one  medicine to lower your blood pressure, make sure you know how and at what times they should be taken. Taking two medicines at the same time can result in blood pressure that is too low. SEEK IMMEDIATE MEDICAL CARE IF:  You develop a severe headache, blurred or changing vision, or confusion.  You have unusual weakness or numbness, or a faint feeling.  You have severe chest or abdominal pain, vomiting, or breathing problems. MAKE SURE YOU:   Understand these instructions.  Will watch your condition.  Will get help right away if you are not doing well or get worse. Document Released: 06/03/2005 Document Revised: 08/26/2011 Document Reviewed: 01/22/2008 [facility] Patient Information 2014 [place], [place].  Smoking  Cessation Quitting smoking is important to your health and has many advantages. However, it is not always easy to quit since nicotine is a very addictive drug. Often times, people try 3 times or more before being able to quit. This document explains the best ways for you to prepare to quit smoking. Quitting takes hard work and a lot of effort, but you can do it. ADVANTAGES OF QUITTING SMOKING  You will live longer, feel better, and live better.  Your body will feel the impact of quitting smoking almost immediately.  Within 20 minutes, blood pressure decreases. Your pulse returns to its normal level.  After 8 hours, carbon monoxide levels in the blood return to normal. Your oxygen level increases.  After 24 hours, the chance of having a heart attack starts to decrease. Your breath, hair, and body stop smelling like smoke.  After 48 hours, damaged nerve endings begin to recover. Your sense of taste and smell improve.  After 72 hours, the body is virtually free of nicotine. Your bronchial tubes relax and breathing becomes easier.  After 2 to 12 weeks, lungs can hold more air. Exercise becomes easier and circulation improves.  The risk of having a heart attack, stroke, cancer, or lung disease is greatly reduced.  After 1 year, the risk of coronary heart disease is cut in half.  After 5 years, the risk of stroke falls to the same as a nonsmoker.  After 10 years, the risk of lung cancer is cut in half and the risk of other cancers decreases significantly.  After 15 years, the risk of coronary heart disease drops, usually to the level of a nonsmoker.  If you are pregnant, quitting smoking will improve your chances of having a healthy baby.  The people you live with, especially any children, will be healthier.  You will have extra money to spend on things other than cigarettes. QUESTIONS TO THINK ABOUT BEFORE ATTEMPTING TO QUIT You may want to talk about your answers with your  caregiver.  Why do you want to quit?  If you tried to quit in the past, what helped and what did not?  What will be the most difficult situations for you after you quit? How will you plan to handle them?  Who can help you through the tough times? Your family? Friends? A caregiver?  What pleasures do you get from smoking? What ways can you still get pleasure if you quit? Here are some questions to ask your caregiver:  How can you help me to be successful at quitting?  What medicine do you think would be best for me and how should I take it?  What should I do if I need more help?  What is smoking withdrawal like? How can I get information on withdrawal? GET  READY  Set a quit date.  Change your environment by getting rid of all cigarettes, ashtrays, matches, and lighters in your home, car, or work. Do not let people smoke in your home.  Review your past attempts to quit. Think about what worked and what did not. GET SUPPORT AND ENCOURAGEMENT You have a better chance of being successful if you have help. You can get support in many ways.  Tell your family, friends, and co-workers that you are going to quit and need their support. Ask them not to smoke around you.  Get individual, group, or telephone counseling and support. Programs are available at [employer] and health centers. Call your local health department for information about programs in your area.  Spiritual beliefs and practices may help some smokers quit.  Download a "quit meter" on your computer to keep track of quit statistics, such as how long you have gone without smoking, cigarettes not smoked, and money saved.  Get a self-help book about quitting smoking and staying off of tobacco. LEARN NEW SKILLS AND BEHAVIORS  Distract yourself from urges to smoke. Talk to someone, go for a walk, or occupy your time with a task.  Change your normal routine. Take a different route to work. Drink tea instead of coffee.  Eat breakfast in a different place.  Reduce your stress. Take a hot bath, exercise, or read a book.  Plan something enjoyable to do every day. Reward yourself for not smoking.  Explore interactive web-based programs that specialize in helping you quit. GET MEDICINE AND USE IT CORRECTLY Medicines can help you stop smoking and decrease the urge to smoke. Combining medicine with the above behavioral methods and support can greatly increase your chances of successfully quitting smoking.  Nicotine replacement therapy helps deliver nicotine to your body without the negative effects and risks of smoking. Nicotine replacement therapy includes nicotine gum, lozenges, inhalers, nasal sprays, and skin patches. Some may be available over-the-counter and others require a prescription.  Antidepressant medicine helps people abstain from smoking, but how this works is unknown. This medicine is available by prescription.  Nicotinic receptor partial agonist medicine simulates the effect of nicotine in your brain. This medicine is available by prescription. Ask your caregiver for advice about which medicines to use and how to use them based on your health history. Your caregiver will tell you what side effects to look out for if you choose to be on a medicine or therapy. Carefully read the information on the package. Do not use any other product containing nicotine while using a nicotine replacement product.  RELAPSE OR DIFFICULT SITUATIONS Most relapses occur within the first 3 months after quitting. Do not be discouraged if you start smoking again. Remember, most people try several times before finally quitting. You may have symptoms of withdrawal because your body is used to nicotine. You may crave cigarettes, be irritable, feel very hungry, cough often, get headaches, or have difficulty concentrating. The withdrawal symptoms are only temporary. They are strongest when you first quit, but they will go away within  10 14 days. To reduce the chances of relapse, try to:  Avoid drinking alcohol. Drinking lowers your chances of successfully quitting.  Reduce the amount of caffeine you consume. Once you quit smoking, the amount of caffeine in your body increases and can give you symptoms, such as a rapid heartbeat, sweating, and anxiety.  Avoid smokers because they can make you want to smoke.  Do not let weight gain distract  you. Many smokers will gain weight when they quit, usually less than 10 pounds. Eat a healthy diet and stay active. You can always lose the weight gained after you quit.  Find ways to improve your mood other than smoking. FOR MORE INFORMATION  www.smokefree.gov  Document Released: 05/28/2001 Document Revised: 12/03/2011 Document Reviewed: 09/12/2011 [facility] Patient Information 2014 [name], [place].

## 2013-02-21 ENCOUNTER — Encounter: Payer: Self-pay | Admitting: Internal Medicine

## 2013-02-24 NOTE — Progress Notes (Signed)
Case discussed with Dr. McLean at the time of the visit.  We reviewed the resident's history and exam and pertinent patient test results.  I agree with the assessment, diagnosis, and plan of care documented in the resident's note.     

## 2013-03-16 ENCOUNTER — Other Ambulatory Visit: Payer: Self-pay | Admitting: Internal Medicine

## 2013-03-24 ENCOUNTER — Emergency Department (HOSPITAL_COMMUNITY)
Admission: EM | Admit: 2013-03-24 | Discharge: 2013-03-24 | Disposition: A | Discharge status: Home / Self Care | Payer: Medicare HMO | Attending: Emergency Medicine | Admitting: Emergency Medicine

## 2013-03-24 ENCOUNTER — Other Ambulatory Visit: Payer: Self-pay | Admitting: *Deleted

## 2013-03-24 DIAGNOSIS — Z87448 Personal history of other diseases of urinary system: Secondary | ICD-10-CM | POA: Insufficient documentation

## 2013-03-24 DIAGNOSIS — I10 Essential (primary) hypertension: Secondary | ICD-10-CM | POA: Insufficient documentation

## 2013-03-24 DIAGNOSIS — I509 Heart failure, unspecified: Secondary | ICD-10-CM | POA: Insufficient documentation

## 2013-03-24 DIAGNOSIS — H269 Unspecified cataract: Secondary | ICD-10-CM | POA: Insufficient documentation

## 2013-03-24 DIAGNOSIS — M109 Gout, unspecified: Secondary | ICD-10-CM

## 2013-03-24 DIAGNOSIS — Z79899 Other long term (current) drug therapy: Secondary | ICD-10-CM | POA: Insufficient documentation

## 2013-03-24 DIAGNOSIS — I252 Old myocardial infarction: Secondary | ICD-10-CM | POA: Insufficient documentation

## 2013-03-24 DIAGNOSIS — F1021 Alcohol dependence, in remission: Secondary | ICD-10-CM | POA: Insufficient documentation

## 2013-03-24 DIAGNOSIS — Z860100 Personal history of colon polyps, unspecified: Secondary | ICD-10-CM | POA: Insufficient documentation

## 2013-03-24 DIAGNOSIS — K219 Gastro-esophageal reflux disease without esophagitis: Secondary | ICD-10-CM | POA: Insufficient documentation

## 2013-03-24 DIAGNOSIS — Z87440 Personal history of urinary (tract) infections: Secondary | ICD-10-CM | POA: Insufficient documentation

## 2013-03-24 DIAGNOSIS — M1990 Unspecified osteoarthritis, unspecified site: Secondary | ICD-10-CM | POA: Insufficient documentation

## 2013-03-24 DIAGNOSIS — E785 Hyperlipidemia, unspecified: Secondary | ICD-10-CM | POA: Insufficient documentation

## 2013-03-24 DIAGNOSIS — Z85118 Personal history of other malignant neoplasm of bronchus and lung: Secondary | ICD-10-CM | POA: Insufficient documentation

## 2013-03-24 DIAGNOSIS — F17200 Nicotine dependence, unspecified, uncomplicated: Secondary | ICD-10-CM | POA: Insufficient documentation

## 2013-03-24 MED ORDER — PREDNISONE 10 MG PO TABS: 60.0000 mg | ORAL_TABLET | Freq: Every day | ORAL | Status: AC

## 2013-03-24 MED ORDER — ONDANSETRON 4 MG PO TBDP
4.0000 mg | ORAL_TABLET | Freq: Once | ORAL | Status: AC
  Administered 2013-03-24: 4 mg via ORAL
  Filled 2013-03-24: qty 1

## 2013-03-24 MED ORDER — HYDROMORPHONE HCL PF 2 MG/ML IJ SOLN
2.0000 mg | Freq: Once | INTRAMUSCULAR | Status: AC
  Administered 2013-03-24: 2 mg via INTRAMUSCULAR
  Filled 2013-03-24: qty 1

## 2013-03-24 MED ORDER — OXYCODONE-ACETAMINOPHEN 5-325 MG PO TABS: 2.0000 | ORAL_TABLET | ORAL | Status: AC | PRN

## 2013-03-24 MED ORDER — HYDROCODONE-ACETAMINOPHEN 5-325 MG PO TABS: 1.0000 | ORAL_TABLET | ORAL | Status: DC | PRN

## 2013-03-24 MED ORDER — PREDNISONE 10 MG PO TABS: 60.0000 mg | ORAL_TABLET | Freq: Every day | ORAL | Status: DC

## 2013-03-24 NOTE — ED Provider Notes (Signed)
CSN: [id number]     Arrival date & time 03/24/13  1914 History   First MD Initiated Contact with Patient 03/24/13 1917     Chief Complaint  Patient presents with  . Foot Pain  . Atrial Fibrillation   (Consider location/radiation/quality/duration/timing/severity/associated sxs/prior Treatment) HPI Comments: 67 y/o male with history of gout, atrial fibrillation managed medically, CHF presenting with left ankle pain x 3 days. Reports similar to prior gout flares. No fevers or injury to the area. Recently discontinued allopurinol and colchicine.   The history is provided by the patient. No language interpreter was used.    Past Medical History  Diagnosis Date  . Diverticulosis of colon   . GERD (gastroesophageal reflux disease)   . Gout   . Hypertension   . Hyperlipidemia   . Renal failure, acute     hx of 2/2 medication, resolved  . Barretts esophagus     bx distal esophagus 2011 neg   . Tobacco abuse   . Alcohol abuse   . Hx of adenomatous colonic polyps   . CHF (congestive heart failure)   . Hiatal hernia     2 cm noted on upper endos 2011   . Cataract   . Internal hemorrhoid   . DJD (degenerative joint disease)     left hip  . Myocardial infarction     hx of, 2 stents 2004. pt was on Plavix x 3 years then transitioned to ASA  . 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 Cataract   . Cholelithiasis   . UTI (lower urinary tract infection)   . AAA (abdominal aortic aneurysm)     noted CT 11/2012 size 3.3 x 3.2 cm   Past Surgical History  Procedure Laterality Date  . Lung lobectomy  11/07    RUL  . Flexible cystourethroscopy  05/24/06  . Other surgical history      hernia repair 2005   . Hernia repair    . Colonoscopy    . Upper gastrointestinal endoscopy     Family History  Problem Relation Age of Onset  . Diabetes Mother   . Hypertension Sister   . Heart disease Sister   . Diabetes Sister   . Arthritis Brother   . Gout  Brother   . Colon cancer Neg Hx   . Esophageal cancer Neg Hx   . Rectal cancer Neg Hx   . Stomach cancer Neg Hx    History  Substance Use Topics  . Smoking status: Current Every Day Smoker -- 0.50 packs/day    Types: Cigarettes  . Smokeless tobacco: Never Used     Comment: trying to cut down.  . Alcohol Use: Yes     Comment: drinking 1 pt gin qweek, (mixed with tap water not tonic)    Review of Systems  Constitutional: Negative for fever and chills.  Respiratory: Negative for chest tightness and shortness of breath.   Cardiovascular: Negative for chest pain.  Gastrointestinal: Negative for nausea and vomiting.  Musculoskeletal: Positive for arthralgias and joint swelling.  Skin: Negative for color change and wound.  Neurological: Negative for numbness.  All other systems reviewed and are negative.    Allergies  Candesartan cilexetil-hctz and Hydrochlorothiazide  Home Medications   Current Outpatient Rx  Name  Route  Sig  Dispense  Refill  . allopurinol (ZYLOPRIM) 100 MG tablet      TAKE 1 TABLET (100 MG TOTAL) BY MOUTH DAILY.  30 tablet   5   . amLODipine (NORVASC) 5 MG tablet   Oral   Take 1 tablet (5 mg total) by mouth daily.   30 tablet   5   . colchicine 0.6 MG tablet   Oral   Take 0.6 mg by mouth daily. For 6 months; Start date 08/06/12         . [facility] ASPIRIN LOW DOSE 81 MG EC tablet      TAKE 1 TABLET (81 MG TOTAL) BY MOUTH AT BEDTIME.   30 tablet   5   . furosemide (LASIX) 20 MG tablet      TAKE 1 TABLET (20 MG TOTAL) BY MOUTH DAILY.   30 tablet   5   . HYDROcodone-acetaminophen (NORCO) 7.5-325 MG per tablet   Oral   Take 1 tablet by mouth every 8 (eight) hours as needed for pain.         . metoprolol (LOPRESSOR) 50 MG tablet   Oral   Take 0.5 tablets (25 mg total) by mouth 2 (two) times daily.   60 tablet   5   . omeprazole (PRILOSEC) 20 MG capsule   Oral   Take 1 capsule (20 mg total) by mouth 2 (two) times daily.   30 capsule    5   . omeprazole (PRILOSEC) 20 MG capsule      TAKE 1 CAPSULE (20 MG TOTAL) BY MOUTH 2 (TWO) TIMES DAILY.   60 capsule   5   . rosuvastatin (CRESTOR) 20 MG tablet   Oral   Take 1 tablet (20 mg total) by mouth daily.   30 tablet   5    BP 145/79  Pulse 104  Temp(Src) 98.9 F (37.2 C) (Oral)  Resp 16  Ht 6' 1.5" (1.867 m)  Wt 202 lb (91.627 kg)  BMI 26.29 kg/m2  SpO2 90% Physical Exam  Vitals reviewed. Constitutional: He is oriented to person, place, and time. He appears well-developed and well-nourished. No distress.  HENT:  Head: Normocephalic.  Eyes: Conjunctivae are normal.  Cardiovascular: Normal rate, normal heart sounds and intact distal pulses.  An irregularly irregular rhythm present.  Pulmonary/Chest: Effort normal and breath sounds normal.  Abdominal: Soft. Bowel sounds are normal. He exhibits no distension. There is no tenderness.  Musculoskeletal:       Left ankle: He exhibits decreased range of motion (due to pain) and swelling. He exhibits normal pulse. Tenderness (diffuse).  Left ankle swollen and warm  Neurological: He is alert and oriented to person, place, and time.  Skin: Skin is warm and dry. No rash noted.  Psychiatric: He has a normal mood and affect. His behavior is normal. Judgment and thought content normal.    ED Course  Procedures (including critical care time) Labs Review Labs Reviewed - No data to display Imaging Review No results found.  MDM   1. Gout attack    67 Y/o male with history of gout, atrial fibrillation (rate controlled), CKD presenting with left ankle pain x 2 days. Similar to prior gout flares. No recent injury. No systemic symptoms. A fib on monitor with rate 94. No chest pain, sob. Left ankle swollen and warm. No cellulitis. Doubt septic joint, fracture, dislocation. Will treat symptomatically with prednisone and percocet as patient has CKD. Appropriate for discharge with PCP follow up. Return precautions discussed and  patient voiced understanding of instructions.   Labs and imaging reviewed in my medical decision making if ordered. Patient discussed with my  attending, Dr. Doreatha Martin, MD 03/25/13 1410

## 2013-03-24 NOTE — ED Notes (Signed)
Per EMS: Pt here with left foot pain x 3 days. Area swollen, denies injury. Hx: gout. Pulses irregular, A Fib on monitor. AO x 4. 140/78. 90 irr.

## 2013-03-25 NOTE — ED Provider Notes (Signed)
I saw and evaluated the patient, reviewed the resident's note and I agree with the findings and plan. I have reviewed EKG and agree with the resident interpretation.  you Pt with c/o of left ankle pain with prior hx of gout and similar sx.  Not improving with his gout meds.  Denies fever or infectious sx.  Pt o/w well appearing.  EKG with ectopy but denies CP, SOB or palpitations and unchanged from prior.  Gwyneth Sprout, MD 03/25/13 2251

## 2013-04-05 NOTE — Telephone Encounter (Signed)
Empty request 

## 2013-04-09 ENCOUNTER — Other Ambulatory Visit: Payer: Self-pay | Admitting: *Deleted

## 2013-04-09 NOTE — Telephone Encounter (Signed)
Call [phone] when ready.

## 2013-04-13 ENCOUNTER — Ambulatory Visit (INDEPENDENT_AMBULATORY_CARE_PROVIDER_SITE_OTHER): Payer: Medicare HMO | Admitting: Internal Medicine

## 2013-04-13 ENCOUNTER — Encounter: Payer: Self-pay | Admitting: Internal Medicine

## 2013-04-13 ENCOUNTER — Other Ambulatory Visit: Payer: Self-pay | Admitting: Internal Medicine

## 2013-04-13 VITALS — BP 101/65 | HR 82 | Temp 98.3°F | Ht 73.5 in | Wt 194.9 lb

## 2013-04-13 DIAGNOSIS — I10 Essential (primary) hypertension: Secondary | ICD-10-CM

## 2013-04-13 DIAGNOSIS — M109 Gout, unspecified: Secondary | ICD-10-CM

## 2013-04-13 MED ORDER — HYDROCODONE-ACETAMINOPHEN 7.5-325 MG PO TABS: 1.0000 | ORAL_TABLET | Freq: Three times a day (TID) | ORAL | Status: DC | PRN

## 2013-04-13 MED ORDER — COLCHICINE 0.6 MG PO TABS: 0.6000 mg | ORAL_TABLET | Freq: Two times a day (BID) | ORAL | Status: DC | PRN

## 2013-04-13 NOTE — Telephone Encounter (Signed)
Pt had called for Hydrocodone refill; had an appt today w/Dr Manson Passey - rx refilled per chart.

## 2013-04-13 NOTE — Assessment & Plan Note (Signed)
The patient has a history of gout, with a flare about 3 weeks ago, and possibly another early flare in his right MTP.  He is also concerned about some lingering L ankle swelling, likely secondary to his high dose steroid usage 3 weeks ago. -for R acute gout flare, take colchicine, 0.6 mg BID x5 days (GFR 30-80) -stop indomethacin, given CKD -pt has hydrocodone-acetaminophen, which he gets regularly from our clinic -for swelling in L ankle, increase lasix from 20 to 40 mg daily for 2 days only, then return to 20 mg daily to try to reduce swelling.

## 2013-04-13 NOTE — Patient Instructions (Addendum)
General Instructions: For your symptoms of gout, take Colchicine, 1 tablet twice per day for 5 days. -STOP taking indomethacin -we have refilled your Hydrocodone-acetaminophen for 1 month.  You will need to discuss with your PCP whether or not to continue this medication.  Please return for a follow-up visit in 3 months.   Treatment Goals:  Goals (1 Years of Data) as of 04/13/13   None      Progress Toward Treatment Goals:  Treatment Goal 04/13/2013  Blood pressure at goal  Stop smoking -    Self Care Goals & Plans:  Self Care Goal 04/13/2013  Manage my medications take my medicines as prescribed; bring my medications to every visit; refill my medications on time; follow the sick day instructions if I am sick  Monitor my health -  Eat healthy foods eat more vegetables; eat fruit for snacks and desserts; eat baked foods instead of fried foods  Be physically active find an activity I enjoy  Meeting treatment goals -    No flowsheet data found.   Care Management & Community Referrals:  Referral 04/13/2013  Referrals made for care management support none needed  Referrals made to community resources -

## 2013-04-13 NOTE — Progress Notes (Signed)
HPI The patient is a 67 y.o. male with a history of gout, CKD, CAD, presenting for an acute ED follow-up for gout.  The patient was in the ED 10/8 for left ankle pain (which initially started 10/5), diagnosed as gout, treated with prednisone 60 mg daily x5 days, and narcotics.  The patient took these medications, with resolution of pain, though symptoms of swelling never fully resolved.  The patient also notes a 1-week history of right foot painless swelling, which he is concerned might be the start of a gout attack.  Although there is not pain, the swelling creates a "pressure" in his ankle, which makes it difficult to walk.  The patient has a history of gout, currently on allopurinol.  ROS: General: no fevers, chills, changes in weight, changes in appetite Skin: no rash HEENT: no blurry vision, hearing changes, sore throat Pulm: no dyspnea, coughing, wheezing CV: no chest pain, palpitations, shortness of breath Abd: no abdominal pain, nausea/vomiting, diarrhea/constipation GU: no dysuria, hematuria, polyuria Ext: see HPI Neuro: no weakness, numbness, or tingling  Filed Vitals:   04/13/13 0855  BP: 101/65  Pulse: 82  Temp: 98.3 F (36.8 C)    PEX General: alert, cooperative, and in no apparent distress HEENT: pupils equal round and reactive to light, vision grossly intact, oropharynx clear and non-erythematous  Neck: supple Lungs: clear to ascultation bilaterally, normal work of respiration, no wheezes, rales, ronchi Heart: regular rate and rhythm, no murmurs, gallops, or rubs Abdomen: soft, non-tender, non-distended, normal bowel sounds Extremities: left ankle with mild non-pitting edema, no ttp, full painless ROM. Right ankle with no significant edema, but right 1st MTP with mild ttp. Neurologic: alert & oriented X3, cranial nerves II-XII intact, strength 5/5 throughout, sensation intact to light touch  Current Outpatient Prescriptions on File Prior to Visit  Medication Sig  Dispense Refill  . allopurinol (ZYLOPRIM) 100 MG tablet Take 100 mg by mouth daily.      Marland Kitchen amLODipine (NORVASC) 5 MG tablet Take 1 tablet (5 mg total) by mouth daily.  30 tablet  5  . aspirin EC 81 MG tablet Take 81 mg by mouth daily.      . furosemide (LASIX) 20 MG tablet Take 20 mg by mouth daily.      Marland Kitchen HYDROcodone-acetaminophen (NORCO) 7.5-325 MG per tablet Take 1 tablet by mouth every 8 (eight) hours as needed for pain.      . metoprolol (LOPRESSOR) 50 MG tablet Take 0.5 tablets (25 mg total) by mouth 2 (two) times daily.  60 tablet  5  . omeprazole (PRILOSEC) 20 MG capsule Take 20 mg by mouth 2 (two) times daily.      . rosuvastatin (CRESTOR) 20 MG tablet Take 1 tablet (20 mg total) by mouth daily.  30 tablet  5   No current facility-administered medications on file prior to visit.    Assessment/Plan

## 2013-04-13 NOTE — Assessment & Plan Note (Signed)
BP Readings from Last 3 Encounters:  04/13/13 101/65  03/24/13 137/79  02/18/13 111/76    Lab Results  Component Value Date   NA 140 01/22/2013   K 3.7 01/22/2013   CREATININE 1.31 01/22/2013    Assessment: Blood pressure control: controlled Progress toward BP goal:  at goal Comments: BP well-controlled.  Norvasc dose decreased to 5 mg daily at his last visit  Plan: Medications:  continue current medications Educational resources provided: brochure;handout;video Self management tools provided:   Other plans: Recheck at next visit

## 2013-04-15 NOTE — Progress Notes (Signed)
Case discussed with Dr. Brown at the time of the visit.  We reviewed the resident's history and exam and pertinent patient test results.  I agree with the assessment, diagnosis, and plan of care documented in the resident's note. 

## 2013-05-01 ENCOUNTER — Emergency Department (HOSPITAL_COMMUNITY)
Admission: EM | Admit: 2013-05-01 | Discharge: 2013-05-01 | Disposition: A | Discharge status: Home / Self Care | Payer: Medicare HMO | Attending: Emergency Medicine | Admitting: Emergency Medicine

## 2013-05-01 ENCOUNTER — Encounter (HOSPITAL_COMMUNITY): Payer: Self-pay | Admitting: Emergency Medicine

## 2013-05-01 ENCOUNTER — Emergency Department (HOSPITAL_COMMUNITY): Payer: Medicare HMO

## 2013-05-01 DIAGNOSIS — E785 Hyperlipidemia, unspecified: Secondary | ICD-10-CM | POA: Insufficient documentation

## 2013-05-01 DIAGNOSIS — F17200 Nicotine dependence, unspecified, uncomplicated: Secondary | ICD-10-CM | POA: Insufficient documentation

## 2013-05-01 DIAGNOSIS — M1990 Unspecified osteoarthritis, unspecified site: Secondary | ICD-10-CM | POA: Insufficient documentation

## 2013-05-01 DIAGNOSIS — Z860100 Personal history of colon polyps, unspecified: Secondary | ICD-10-CM | POA: Insufficient documentation

## 2013-05-01 DIAGNOSIS — I509 Heart failure, unspecified: Secondary | ICD-10-CM | POA: Insufficient documentation

## 2013-05-01 DIAGNOSIS — H269 Unspecified cataract: Secondary | ICD-10-CM | POA: Insufficient documentation

## 2013-05-01 DIAGNOSIS — Z87448 Personal history of other diseases of urinary system: Secondary | ICD-10-CM | POA: Insufficient documentation

## 2013-05-01 DIAGNOSIS — I10 Essential (primary) hypertension: Secondary | ICD-10-CM | POA: Insufficient documentation

## 2013-05-01 DIAGNOSIS — M109 Gout, unspecified: Secondary | ICD-10-CM | POA: Insufficient documentation

## 2013-05-01 DIAGNOSIS — I252 Old myocardial infarction: Secondary | ICD-10-CM | POA: Insufficient documentation

## 2013-05-01 DIAGNOSIS — Z79899 Other long term (current) drug therapy: Secondary | ICD-10-CM | POA: Insufficient documentation

## 2013-05-01 DIAGNOSIS — Z7982 Long term (current) use of aspirin: Secondary | ICD-10-CM | POA: Insufficient documentation

## 2013-05-01 DIAGNOSIS — Z85118 Personal history of other malignant neoplasm of bronchus and lung: Secondary | ICD-10-CM | POA: Insufficient documentation

## 2013-05-01 DIAGNOSIS — F1021 Alcohol dependence, in remission: Secondary | ICD-10-CM | POA: Insufficient documentation

## 2013-05-01 DIAGNOSIS — Z87440 Personal history of urinary (tract) infections: Secondary | ICD-10-CM | POA: Insufficient documentation

## 2013-05-01 DIAGNOSIS — M722 Plantar fascial fibromatosis: Secondary | ICD-10-CM | POA: Insufficient documentation

## 2013-05-01 DIAGNOSIS — K219 Gastro-esophageal reflux disease without esophagitis: Secondary | ICD-10-CM | POA: Insufficient documentation

## 2013-05-01 NOTE — ED Provider Notes (Signed)
CSN: [id number]     Arrival date & time 05/01/13  1606 History  This chart was scribed for non-physician practitioner, Teressa Lower, NP,working with Candyce Churn, MD, by Karle Plumber, ED Scribe.  This patient was seen in room TR06C/TR06C and the patient's care was started at 4:15 PM.  Chief Complaint  Patient presents with  . Foot Pain   The history is provided by the patient. No language interpreter was used.  HPI Comments:  [name] is a 67 y.o. male who presents to the Emergency Department complaining of severe left foot pain onset two weeks. Pt states he has h/o gout but this pain is different. He reports associated swelling and pain with touching. Pt states that the pain has almost made him fall, however he has not fallen and denies any injury to the foot. He has presented here to the ED and to his PCP and has been treated with gout medications without relief.    Past Medical History  Diagnosis Date  . Diverticulosis of colon   . GERD (gastroesophageal reflux disease)   . Gout   . Hypertension   . Hyperlipidemia   . Renal failure, acute     hx of 2/2 medication, resolved  . Barretts esophagus     bx distal esophagus 2011 neg   . Tobacco abuse   . Alcohol abuse   . Hx of adenomatous colonic polyps   . CHF (congestive heart failure)   . Hiatal hernia     2 cm noted on upper endos 2011   . Cataract   . Internal hemorrhoid   . DJD (degenerative joint disease)     left hip  . Myocardial infarction     hx of, 2 stents 2004. pt was on Plavix x 3 years then transitioned to ASA  . 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 Cataract   . Cholelithiasis   . UTI (lower urinary tract infection)   . AAA (abdominal aortic aneurysm)     noted CT 11/2012 size 3.3 x 3.2 cm   Past Surgical History  Procedure Laterality Date  . Lung lobectomy  11/07    RUL  . Flexible cystourethroscopy  05/24/06  . Other surgical history     hernia repair 2005   . Hernia repair    . Colonoscopy    . Upper gastrointestinal endoscopy     Family History  Problem Relation Age of Onset  . Diabetes Mother   . Hypertension Sister   . Heart disease Sister   . Diabetes Sister   . Arthritis Brother   . Gout Brother   . Colon cancer Neg Hx   . Esophageal cancer Neg Hx   . Rectal cancer Neg Hx   . Stomach cancer Neg Hx    History  Substance Use Topics  . Smoking status: Current Every Day Smoker -- 0.50 packs/day    Types: Cigarettes  . Smokeless tobacco: Never Used     Comment: trying to cut down.  . Alcohol Use: Yes     Comment: drinking 1 pt gin qweek, (mixed with tap water not tonic)    Review of Systems  Musculoskeletal: Positive for arthralgias (left foot) and joint swelling (left ankle).  All other systems reviewed and are negative.    Allergies  Candesartan cilexetil-hctz; Hydrochlorothiazide; and Shellfish allergy  Home Medications   Current Outpatient Rx  Name  Route  Sig  Dispense  Refill  . allopurinol (ZYLOPRIM) 100 MG tablet   Oral   Take 100 mg by mouth daily.         Marland Kitchen amLODipine (NORVASC) 5 MG tablet   Oral   Take 1 tablet (5 mg total) by mouth daily.   30 tablet   5   . aspirin EC 81 MG tablet   Oral   Take 81 mg by mouth daily.         . colchicine 0.6 MG tablet   Oral   Take 1 tablet (0.6 mg total) by mouth 2 (two) times daily as needed.   30 tablet   0   . furosemide (LASIX) 20 MG tablet   Oral   Take 20 mg by mouth daily.         Marland Kitchen HYDROcodone-acetaminophen (NORCO) 7.5-325 MG per tablet   Oral   Take 1 tablet by mouth every 8 (eight) hours as needed for pain.   90 tablet   0   . metoprolol (LOPRESSOR) 50 MG tablet   Oral   Take 0.5 tablets (25 mg total) by mouth 2 (two) times daily.   60 tablet   5   . omeprazole (PRILOSEC) 20 MG capsule   Oral   Take 20 mg by mouth 2 (two) times daily.         . rosuvastatin (CRESTOR) 20 MG tablet   Oral   Take 1 tablet  (20 mg total) by mouth daily.   30 tablet   5    Triage Vitals: BP 143/104  Pulse 111  Temp(Src) 97.4 F (36.3 C) (Oral)  Resp 20  Wt 196 lb (88.905 kg)  SpO2 98% Physical Exam  Nursing note and vitals reviewed. Constitutional: He is oriented to person, place, and time. He appears well-developed and well-nourished. No distress.  HENT:  Head: Normocephalic and atraumatic.  Eyes: Conjunctivae are normal. No scleral icterus.  Neck: Neck supple.  Cardiovascular: Normal rate and intact distal pulses.   Pulmonary/Chest: Effort normal and breath sounds normal. No stridor. No respiratory distress. He has no wheezes. He has no rales.  Abdominal: Normal appearance. He exhibits no distension.  Musculoskeletal:  Swelling to lateral aspect of left ankle. Tender to palpation to the lateral and posterior aspect of the left ankle.   Neurological: He is alert and oriented to person, place, and time.  Skin: Skin is warm and dry. No rash noted.  Psychiatric: He has a normal mood and affect. His behavior is normal.    ED Course  Procedures (including critical care time) DIAGNOSTIC STUDIES: Oxygen Saturation is 98% on RA, normal by my interpretation.   COORDINATION OF CARE: 4:18 PM- Will X-Ray foot. Pt verbalizes understanding and agrees to plan.  Medications - No data to display  Labs Review Labs Reviewed - No data to display Imaging Review Dg Ankle Complete Left  05/01/2013   CLINICAL DATA:  Lateral ankle pain and swelling, history of gout  EXAM: LEFT ANKLE COMPLETE - 3+ VIEW  COMPARISON:  None.  FINDINGS: Mild soft tissue swelling about the lateral malleolus. This finding is without associated displaced fracture or dislocation. No ankle joint effusion. Small plantar calcaneal spur. Joint spaces are preserved. A pes planus deformity is suspected on this nonweightbearing radiograph. Scattered minimal adjacent vascular calcifications. No radiopaque foreign body.  IMPRESSION: Mild soft tissue  swelling about the lateral malleolus without associated fracture or dislocation.   Electronically Signed   By: Simonne Come M.D.   On:  05/01/2013 17:18    EKG Interpretation   None       MDM   1. Plantar fasciitis of left foot    No redness or warmth noted to the area:don;t think that it is gout related:pt is on hydrocodone at this time:pt can follow up with orthopedist  I personally performed the services described in this documentation, which was scribed in my presence. The recorded information has been reviewed and is accurate.    Teressa Lower, NP 05/01/13 1747

## 2013-05-01 NOTE — ED Notes (Signed)
Pt reports persistent L foot pain. Pt has been seen here several times for the same and was diagnosed with gout but reports pain continues. Pt has been taking vicodin with minimal relief.

## 2013-05-02 NOTE — ED Provider Notes (Signed)
Medical screening examination/treatment/procedure(s) were performed by non-physician practitioner and as supervising physician I was immediately available for consultation/collaboration.  Candyce Churn, MD 05/02/13 [phone]

## 2013-05-11 ENCOUNTER — Other Ambulatory Visit: Payer: Self-pay | Admitting: *Deleted

## 2013-05-11 DIAGNOSIS — M1610 Unilateral primary osteoarthritis, unspecified hip: Secondary | ICD-10-CM

## 2013-05-12 ENCOUNTER — Other Ambulatory Visit: Payer: Self-pay | Admitting: Internal Medicine

## 2013-05-12 MED ORDER — HYDROCODONE-ACETAMINOPHEN 7.5-325 MG PO TABS: 1.0000 | ORAL_TABLET | Freq: Four times a day (QID) | ORAL | Status: DC | PRN

## 2013-05-12 MED ORDER — HYDROCODONE-ACETAMINOPHEN 7.5-500 MG PO TABS: 1.0000 | ORAL_TABLET | Freq: Three times a day (TID) | ORAL | Status: DC | PRN

## 2013-05-20 ENCOUNTER — Ambulatory Visit (INDEPENDENT_AMBULATORY_CARE_PROVIDER_SITE_OTHER): Payer: Medicare HMO | Admitting: Internal Medicine

## 2013-05-20 ENCOUNTER — Encounter: Payer: Self-pay | Admitting: Internal Medicine

## 2013-05-20 ENCOUNTER — Encounter (INDEPENDENT_AMBULATORY_CARE_PROVIDER_SITE_OTHER): Payer: Self-pay

## 2013-05-20 VITALS — BP 117/74 | HR 75 | Temp 97.3°F | Ht 72.5 in | Wt 194.2 lb

## 2013-05-20 DIAGNOSIS — M25572 Pain in left ankle and joints of left foot: Secondary | ICD-10-CM

## 2013-05-20 DIAGNOSIS — F17200 Nicotine dependence, unspecified, uncomplicated: Secondary | ICD-10-CM

## 2013-05-20 DIAGNOSIS — I714 Abdominal aortic aneurysm, without rupture, unspecified: Secondary | ICD-10-CM

## 2013-05-20 DIAGNOSIS — M109 Gout, unspecified: Secondary | ICD-10-CM

## 2013-05-20 DIAGNOSIS — M25579 Pain in unspecified ankle and joints of unspecified foot: Secondary | ICD-10-CM

## 2013-05-20 DIAGNOSIS — I10 Essential (primary) hypertension: Secondary | ICD-10-CM

## 2013-05-20 NOTE — Patient Instructions (Addendum)
General Instructions: Please follow up in 4-6 months  Happy Holidays  Try to quit smoking  Read all the information below Get you Abdominal Ultrasound this month   Treatment Goals:  Goals (1 Years of Data) as of 05/20/13         As of Today 05/01/13 04/13/13 03/24/13 03/24/13     Blood Pressure    . Blood Pressure < 140/90  117/74 143/104 101/65 137/79 145/79      Progress Toward Treatment Goals:  Treatment Goal 05/20/2013  Blood pressure at goal  Stop smoking smoking less    Self Care Goals & Plans:  Self Care Goal 05/20/2013  Manage my medications take my medicines as prescribed; bring my medications to every visit; refill my medications on time  Monitor my health keep track of my blood pressure; bring my blood pressure log to each visit; keep track of my weight  Eat healthy foods drink diet soda or water instead of juice or soda; eat more vegetables; eat foods that are low in salt; eat baked foods instead of fried foods; eat fruit for snacks and desserts; eat smaller portions  Be physically active find an activity I enjoy  Stop smoking call [facility] (1-800-QUIT-NOW)  Meeting treatment goals maintain the current self-care plan    No flowsheet data found.   Care Management & Community Referrals:  Referral 05/20/2013  Referrals made for care management support none needed  Referrals made to community resources none       Fall Prevention and Home Safety Falls cause injuries and can affect all age groups. It is possible to prevent falls.  HOW TO PREVENT FALLS  Wear shoes with rubber soles that do not have an opening for your toes.  Keep the inside and outside of your house well lit.  Use night lights throughout your home.  Remove clutter from floors.  Clean up floor spills.  Remove throw rugs or fasten them to the floor with carpet tape.  Do not place electrical cords across pathways.  Put grab bars by your tub, shower, and toilet. Do not use towel bars as  grab bars.  Put handrails on both sides of the stairway. Fix loose handrails.  Do not climb on stools or stepladders, if possible.  Do not wax your floors.  Repair uneven or unsafe sidewalks, walkways, or stairs.  Keep items you use a lot within reach.  Be aware of pets.  Keep emergency numbers next to the telephone.  Put smoke detectors in your home and near bedrooms. Ask your doctor what other things you can do to prevent falls. Document Released: 03/30/2009 Document Revised: 12/03/2011 Document Reviewed: 09/03/2011 [facility] Patient Information 2014 [place], [place].  Smoking Cessation Quitting smoking is important to your health and has many advantages. However, it is not always easy to quit since nicotine is a very addictive drug. Often times, people try 3 times or more before being able to quit. This document explains the best ways for you to prepare to quit smoking. Quitting takes hard work and a lot of effort, but you can do it. ADVANTAGES OF QUITTING SMOKING  You will live longer, feel better, and live better.  Your body will feel the impact of quitting smoking almost immediately.  Within 20 minutes, blood pressure decreases. Your pulse returns to its normal level.  After 8 hours, carbon monoxide levels in the blood return to normal. Your oxygen level increases.  After 24 hours, the chance of having a heart attack starts  to decrease. Your breath, hair, and body stop smelling like smoke.  After 48 hours, damaged nerve endings begin to recover. Your sense of taste and smell improve.  After 72 hours, the body is virtually free of nicotine. Your bronchial tubes relax and breathing becomes easier.  After 2 to 12 weeks, lungs can hold more air. Exercise becomes easier and circulation improves.  The risk of having a heart attack, stroke, cancer, or lung disease is greatly reduced.  After 1 year, the risk of coronary heart disease is cut in half.  After 5 years, the risk  of stroke falls to the same as a nonsmoker.  After 10 years, the risk of lung cancer is cut in half and the risk of other cancers decreases significantly.  After 15 years, the risk of coronary heart disease drops, usually to the level of a nonsmoker.  If you are pregnant, quitting smoking will improve your chances of having a healthy baby.  The people you live with, especially any children, will be healthier.  You will have extra money to spend on things other than cigarettes. QUESTIONS TO THINK ABOUT BEFORE ATTEMPTING TO QUIT You may want to talk about your answers with your caregiver.  Why do you want to quit?  If you tried to quit in the past, what helped and what did not?  What will be the most difficult situations for you after you quit? How will you plan to handle them?  Who can help you through the tough times? Your family? Friends? A caregiver?  What pleasures do you get from smoking? What ways can you still get pleasure if you quit? Here are some questions to ask your caregiver:  How can you help me to be successful at quitting?  What medicine do you think would be best for me and how should I take it?  What should I do if I need more help?  What is smoking withdrawal like? How can I get information on withdrawal? GET READY  Set a quit date.  Change your environment by getting rid of all cigarettes, ashtrays, matches, and lighters in your home, car, or work. Do not let people smoke in your home.  Review your past attempts to quit. Think about what worked and what did not. GET SUPPORT AND ENCOURAGEMENT You have a better chance of being successful if you have help. You can get support in many ways.  Tell your family, friends, and co-workers that you are going to quit and need their support. Ask them not to smoke around you.  Get individual, group, or telephone counseling and support. Programs are available at [employer] and health centers. Call your local  health department for information about programs in your area.  Spiritual beliefs and practices may help some smokers quit.  Download a "quit meter" on your computer to keep track of quit statistics, such as how long you have gone without smoking, cigarettes not smoked, and money saved.  Get a self-help book about quitting smoking and staying off of tobacco. LEARN NEW SKILLS AND BEHAVIORS  Distract yourself from urges to smoke. Talk to someone, go for a walk, or occupy your time with a task.  Change your normal routine. Take a different route to work. Drink tea instead of coffee. Eat breakfast in a different place.  Reduce your stress. Take a hot bath, exercise, or read a book.  Plan something enjoyable to do every day. Reward yourself for not smoking.  Explore interactive web-based  programs that specialize in helping you quit. GET MEDICINE AND USE IT CORRECTLY Medicines can help you stop smoking and decrease the urge to smoke. Combining medicine with the above behavioral methods and support can greatly increase your chances of successfully quitting smoking.  Nicotine replacement therapy helps deliver nicotine to your body without the negative effects and risks of smoking. Nicotine replacement therapy includes nicotine gum, lozenges, inhalers, nasal sprays, and skin patches. Some may be available over-the-counter and others require a prescription.  Antidepressant medicine helps people abstain from smoking, but how this works is unknown. This medicine is available by prescription.  Nicotinic receptor partial agonist medicine simulates the effect of nicotine in your brain. This medicine is available by prescription. Ask your caregiver for advice about which medicines to use and how to use them based on your health history. Your caregiver will tell you what side effects to look out for if you choose to be on a medicine or therapy. Carefully read the information on the package. Do not use any  other product containing nicotine while using a nicotine replacement product.  RELAPSE OR DIFFICULT SITUATIONS Most relapses occur within the first 3 months after quitting. Do not be discouraged if you start smoking again. Remember, most people try several times before finally quitting. You may have symptoms of withdrawal because your body is used to nicotine. You may crave cigarettes, be irritable, feel very hungry, cough often, get headaches, or have difficulty concentrating. The withdrawal symptoms are only temporary. They are strongest when you first quit, but they will go away within 10 14 days. To reduce the chances of relapse, try to:  Avoid drinking alcohol. Drinking lowers your chances of successfully quitting.  Reduce the amount of caffeine you consume. Once you quit smoking, the amount of caffeine in your body increases and can give you symptoms, such as a rapid heartbeat, sweating, and anxiety.  Avoid smokers because they can make you want to smoke.  Do not let weight gain distract you. Many smokers will gain weight when they quit, usually less than 10 pounds. Eat a healthy diet and stay active. You can always lose the weight gained after you quit.  Find ways to improve your mood other than smoking. FOR MORE INFORMATION  www.smokefree.gov  Document Released: 05/28/2001 Document Revised: 12/03/2011 Document Reviewed: 09/12/2011 [facility] Patient Information 2014 [place], [place].  Hypertension As your heart beats, it forces blood through your arteries. This force is your blood pressure. If the pressure is too high, it is called hypertension (HTN) or high blood pressure. HTN is dangerous because you may have it and not know it. High blood pressure may mean that your heart has to work harder to pump blood. Your arteries may be narrow or stiff. The extra work puts you at risk for heart disease, stroke, and other problems.  Blood pressure consists of two numbers, a higher number over a  lower, 110/72, for example. It is stated as "110 over 72." The ideal is below 120 for the top number (systolic) and under 80 for the bottom (diastolic). Write down your blood pressure today. You should pay close attention to your blood pressure if you have certain conditions such as:  Heart failure.  Prior heart attack.  Diabetes  Chronic kidney disease.  Prior stroke.  Multiple risk factors for heart disease. To see if you have HTN, your blood pressure should be measured while you are seated with your arm held at the level of the heart. It should  be measured at least twice. A one-time elevated blood pressure reading (especially in the Emergency Department) does not mean that you need treatment. There may be conditions in which the blood pressure is different between your right and left arms. It is important to see your caregiver soon for a recheck. Most people have essential hypertension which means that there is not a specific cause. This type of high blood pressure may be lowered by changing lifestyle factors such as:  Stress.  Smoking.  Lack of exercise.  Excessive weight.  Drug/tobacco/alcohol use.  Eating less salt. Most people do not have symptoms from high blood pressure until it has caused damage to the body. Effective treatment can often prevent, delay or reduce that damage. TREATMENT  When a cause has been identified, treatment for high blood pressure is directed at the cause. There are a large number of medications to treat HTN. These fall into several categories, and your caregiver will help you select the medicines that are best for you. Medications may have side effects. You should review side effects with your caregiver. If your blood pressure stays high after you have made lifestyle changes or started on medicines,   Your medication(s) may need to be changed.  Other problems may need to be addressed.  Be certain you understand your prescriptions, and know how and  when to take your medicine.  Be sure to follow up with your caregiver within the time frame advised (usually within two weeks) to have your blood pressure rechecked and to review your medications.  If you are taking more than one medicine to lower your blood pressure, make sure you know how and at what times they should be taken. Taking two medicines at the same time can result in blood pressure that is too low. SEEK IMMEDIATE MEDICAL CARE IF:  You develop a severe headache, blurred or changing vision, or confusion.  You have unusual weakness or numbness, or a faint feeling.  You have severe chest or abdominal pain, vomiting, or breathing problems. MAKE SURE YOU:   Understand these instructions.  Will watch your condition.  Will get help right away if you are not doing well or get worse. Document Released: 06/03/2005 Document Revised: 08/26/2011 Document Reviewed: 01/22/2008 [facility] Patient Information 2014 [place], [place].  Ankle Pain Ankle pain is a common symptom. The bones, cartilage, tendons, and muscles of the ankle joint perform a lot of work each day. The ankle joint holds your body weight and allows you to move around. Ankle pain can occur on either side or back of 1 or both ankles. Ankle pain may be sharp and burning or dull and aching. There may be tenderness, stiffness, redness, or warmth around the ankle. The pain occurs more often when a person walks or puts pressure on the ankle. CAUSES  There are many reasons ankle pain can develop. It is important to work with your caregiver to identify the cause since many conditions can impact the bones, cartilage, muscles, and tendons. Causes for ankle pain include:  Injury, including a break (fracture), sprain, or strain often due to a fall, sports, or a high-impact activity.  Swelling (inflammation) of a tendon (tendonitis).  Achilles tendon rupture.  Ankle instability after repeated sprains and strains.  Poor foot  alignment.  Pressure on a nerve (tarsal tunnel syndrome).  Arthritis in the ankle or the lining of the ankle.  Crystal formation in the ankle (gout or pseudogout). DIAGNOSIS  A diagnosis is based on your medical history,  your symptoms, results of your physical exam, and results of diagnostic tests. Diagnostic tests may include X-ray exams or a computerized magnetic scan (magnetic resonance imaging, MRI). TREATMENT  Treatment will depend on the cause of your ankle pain and may include:  Keeping pressure off the ankle and limiting activities.  Using crutches or other walking support (a cane or brace).  Using rest, ice, compression, and elevation.  Participating in physical therapy or home exercises.  Wearing shoe inserts or special shoes.  Losing weight.  Taking medications to reduce pain or swelling or receiving an injection.  Undergoing surgery. HOME CARE INSTRUCTIONS   Only take over-the-counter or prescription medicines for pain, discomfort, or fever as directed by your caregiver.  Put ice on the injured area.  Put ice in a plastic bag.  Place a towel between your skin and the bag.  Leave the ice on for 15-20 minutes at a time, 03-04 times a day.  Keep your leg raised (elevated) when possible to lessen swelling.  Avoid activities that cause ankle pain.  Follow specific exercises as directed by your caregiver.  Record how often you have ankle pain, the location of the pain, and what it feels like. This information may be helpful to you and your caregiver.  Ask your caregiver about returning to work or sports and whether you should drive.  Follow up with your caregiver for further examination, therapy, or testing as directed. SEEK MEDICAL CARE IF:   Pain or swelling continues or worsens beyond 1 week.  You have an oral temperature above 102 F (38.9 C).  You are feeling unwell or have chills.  You are having an increasingly difficult time with  walking.  You have loss of sensation or other new symptoms.  You have questions or concerns. MAKE SURE YOU:   Understand these instructions.  Will watch your condition.  Will get help right away if you are not doing well or get worse. Document Released: 11/21/2009 Document Revised: 08/26/2011 Document Reviewed: 11/21/2009 [facility] Patient Information 2014 [place], [place].  Gout Gout is an inflammatory arthritis caused by a buildup of uric acid crystals in the joints. Uric acid is a chemical that is normally present in the blood. When the level of uric acid in the blood is too high it can form crystals that deposit in your joints and tissues. This causes joint redness, soreness, and swelling (inflammation). Repeat attacks are common. Over time, uric acid crystals can form into masses (tophi) near a joint, destroying bone and causing disfigurement. Gout is treatable and often preventable. CAUSES  The disease begins with elevated levels of uric acid in the blood. Uric acid is produced by your body when it breaks down a naturally found substance called purines. Certain foods you eat, such as meats and fish, contain high amounts of purines. Causes of an elevated uric acid level include:  Being passed down from parent to child (heredity).  Diseases that cause increased uric acid production (such as obesity, psoriasis, and certain cancers).  Excessive alcohol use.  Diet, especially diets rich in meat and seafood.  Medicines, including certain cancer-fighting medicines (chemotherapy), water pills (diuretics), and aspirin.  Chronic kidney disease. The kidneys are no longer able to remove uric acid well.  Problems with metabolism. Conditions strongly associated with gout include:  Obesity.  High blood pressure.  High cholesterol.  Diabetes. Not everyone with elevated uric acid levels gets gout. It is not understood why some people get gout and others do not. Surgery,  joint injury, and  eating too much of certain foods are some of the factors that can lead to gout attacks. SYMPTOMS   An attack of gout comes on quickly. It causes intense pain with redness, swelling, and warmth in a joint.  Fever can occur.  Often, only one joint is involved. Certain joints are more commonly involved:  Base of the big toe.  Knee.  Ankle.  Wrist.  Finger. Without treatment, an attack usually goes away in a few days to weeks. Between attacks, you usually will not have symptoms, which is different from many other forms of arthritis. DIAGNOSIS  Your caregiver will suspect gout based on your symptoms and exam. In some cases, tests may be recommended. The tests may include:  Blood tests.  Urine tests.  X-rays.  Joint fluid exam. This exam requires a needle to remove fluid from the joint (arthrocentesis). Using a microscope, gout is confirmed when uric acid crystals are seen in the joint fluid. TREATMENT  There are two phases to gout treatment: treating the sudden onset (acute) attack and preventing attacks (prophylaxis).  Treatment of an Acute Attack.  Medicines are used. These include anti-inflammatory medicines or steroid medicines.  An injection of steroid medicine into the affected joint is sometimes necessary.  The painful joint is rested. Movement can worsen the arthritis.  You may use warm or cold treatments on painful joints, depending which works best for you.  Treatment to Prevent Attacks.  If you suffer from frequent gout attacks, your caregiver may advise preventive medicine. These medicines are started after the acute attack subsides. These medicines either help your kidneys eliminate uric acid from your body or decrease your uric acid production. You may need to stay on these medicines for a very long time.  The early phase of treatment with preventive medicine can be associated with an increase in acute gout attacks. For this reason, during the first few months  of treatment, your caregiver may also advise you to take medicines usually used for acute gout treatment. Be sure you understand your caregiver's directions. Your caregiver may make several adjustments to your medicine dose before these medicines are effective.  Discuss dietary treatment with your caregiver or dietitian. Alcohol and drinks high in sugar and fructose and foods such as meat, poultry, and seafood can increase uric acid levels. Your caregiver or dietician can advise you on drinks and foods that should be limited. HOME CARE INSTRUCTIONS   Do not take aspirin to relieve pain. This raises uric acid levels.  Only take over-the-counter or prescription medicines for pain, discomfort, or fever as directed by your caregiver.  Rest the joint as much as possible. When in bed, keep sheets and blankets off painful areas.  Keep the affected joint raised (elevated).  Apply warm or cold treatments to painful joints. Use of warm or cold treatments depends on which works best for you.  Use crutches if the painful joint is in your leg.  Drink enough fluids to keep your urine clear or pale yellow. This helps your body get rid of uric acid. Limit alcohol, sugary drinks, and fructose drinks.  Follow your dietary instructions. Pay careful attention to the amount of protein you eat. Your daily diet should emphasize fruits, vegetables, whole grains, and fat-free or low-fat milk products. Discuss the use of coffee, vitamin C, and cherries with your caregiver or dietician. These may be helpful in lowering uric acid levels.  Maintain a healthy body weight. SEEK MEDICAL CARE IF:  You develop diarrhea, vomiting, or any side effects from medicines.  You do not feel better in 24 hours, or you are getting worse. SEEK IMMEDIATE MEDICAL CARE IF:   Your joint becomes suddenly more tender, and you have chills or a fever. MAKE SURE YOU:   Understand these instructions.  Will watch your condition.  Will  get help right away if you are not doing well or get worse. Document Released: 05/31/2000 Document Revised: 09/28/2012 Document Reviewed: 01/15/2012 [facility] Patient Information 2014 [place], [place].

## 2013-05-20 NOTE — Assessment & Plan Note (Signed)
  Assessment: Progress toward smoking cessation:  smoking less Barriers to progress toward smoking cessation:   (duration of smoking) Comments: none  Plan: Instruction/counseling given:  I counseled patient on the dangers of tobacco use, advised patient to stop smoking, and reviewed strategies to maximize success. Educational resources provided:  other (see comments) Self management tools provided:  other (see comments);smoking cessation plan ([facility] Quit Plan) Medications to assist with smoking cessation:  None Patient agreed to the following self-care plans for smoking cessation: call [facility] (1-800-QUIT-NOW)  Other plans: keep encourage cessation

## 2013-05-20 NOTE — Assessment & Plan Note (Addendum)
Ordered repeat US ab to follow size If enlarged consider referral to Vascular  Encouraged smoking cessation

## 2013-05-20 NOTE — Assessment & Plan Note (Signed)
Uric acid >6 will need ultimately increased dose of Allupurinol to 200 mg qd in the future goal UA <6

## 2013-05-20 NOTE — Assessment & Plan Note (Signed)
BP Readings from Last 3 Encounters:  05/20/13 117/74  05/01/13 143/104  04/13/13 101/65    Lab Results  Component Value Date   NA 140 01/22/2013   K 3.7 01/22/2013   CREATININE 1.31 01/22/2013    Assessment: Blood pressure control: controlled Progress toward BP goal:  at goal Comments: none, improved BP this visit   Plan: Medications:  continue current medications Educational resources provided: handout Self management tools provided: other (see comments) Other plans: none

## 2013-05-20 NOTE — Assessment & Plan Note (Signed)
This was noted in prior visits though pt states pain and swelling are improved. No pain and decreased pain.  He is wearing an ankle brace and supportive foot care which helps  Etiology could have been gout, pseudogout.  Xray was negative for fracture.  Does not appear to be in distribution of plantar fascitis.   Can take NSAIDS if pain recurs  Return if pain returns  Continue allopurinol for gout, discussed alcohol cessation.

## 2013-05-20 NOTE — Progress Notes (Signed)
   Subjective:    Patient ID: [name], male    DOB: Mar 19, 1946, 67 y.o.   MRN: [id number]  HPI Comments: 67 y.o PMH AAA (needs repeat Ab [place] 05/2013 to check size), barretts esophagus, CAD, lung cancer s/p treatment follows with Dr. Gwenyth Bouillon currently in remission, gallstones, GERD, ED, gout, HLD (LDL 47 02/2013), CAD with h/o MI s/p stents, HTN (BP 117/74)  He denies any complaints and presents for ED follow up for plantar fascitis.  When asked he never had any heel pain pain was located in lateral malleolus distribution along with swelling though he does not have pain today and swelling has decreased.  Of note he has had gout in the area before and he was recently seen in clinic and tx'ed for gout in 03/2013.  He is wearing an ankle brace which helps with pain associated with walking which has improved.  11/15 Xray showed mild soft tissue swelling in the lateral malleolus w/o fracture.    SH: still smoking 1 pack will last 3 days.              Review of Systems  Respiratory: Negative for shortness of breath.   Cardiovascular: Negative for chest pain.  Gastrointestinal: Negative for abdominal pain.  Musculoskeletal: Positive for joint swelling. Negative for arthralgias.       Objective:   Physical Exam  Nursing note and vitals reviewed. Constitutional: He is oriented to person, place, and time. Vital signs are normal. He appears well-developed and well-nourished. He is cooperative.  HENT:  Head: Normocephalic and atraumatic.  Mouth/Throat: He has dentures. No oropharyngeal exudate.  Eyes: Conjunctivae are normal. Right eye exhibits no discharge. Left eye exhibits no discharge. No scleral icterus.  Cardiovascular: Normal rate, regular rhythm, S1 normal, S2 normal and normal heart sounds.   No murmur heard. Pulmonary/Chest: Effort normal and breath sounds normal.  Musculoskeletal: He exhibits no edema.  Mild swelling lateral malleolus no ttp   Neurological: He is alert and  oriented to person, place, and time. Gait normal.  Skin: Skin is warm, dry and intact. No rash noted.  Psychiatric: He has a normal mood and affect. His speech is normal and behavior is normal. Judgment and thought content normal. Cognition and memory are normal.          Assessment & Plan:

## 2013-05-20 NOTE — Progress Notes (Signed)
Case discussed with Dr. McLean soon after the resident saw the patient.  We reviewed the resident's history and exam and pertinent patient test results.  I agree with the assessment, diagnosis and plan of care documented in the resident's note. 

## 2013-05-21 ENCOUNTER — Other Ambulatory Visit: Payer: Self-pay | Admitting: Internal Medicine

## 2013-05-21 ENCOUNTER — Ambulatory Visit (HOSPITAL_COMMUNITY)
Admission: RE | Admit: 2013-05-21 | Discharge: 2013-05-21 | Disposition: A | Discharge status: Home / Self Care | Payer: Medicare HMO | Source: Ambulatory Visit | Attending: Internal Medicine | Admitting: Internal Medicine

## 2013-05-21 DIAGNOSIS — I714 Abdominal aortic aneurysm, without rupture, unspecified: Secondary | ICD-10-CM | POA: Insufficient documentation

## 2013-05-25 ENCOUNTER — Encounter: Payer: Self-pay | Admitting: Internal Medicine

## 2013-06-16 IMAGING — CT CT CHEST W/ CM
2 of 4 series · 15 of 36 positions shown, 18 images · IV contrast (agent unspecified)
Comparison: 06/19/2010

CT CHEST

CLINICAL DATA: Follow up lung cancer

CT CHEST WITH CONTRAST
TECHNIQUE: Multidetector CT imaging of the chest  was performed
following the standard protocol during bolus administration of
intravenous contrast.
Contrast: 80 ml of omni 300

[Series 2: chest with st · axial · 0.79mm/px · z∈[-320,-60]mm · 12 of 62 slices shown, 15 images]
[im 5/62  mediastinal]
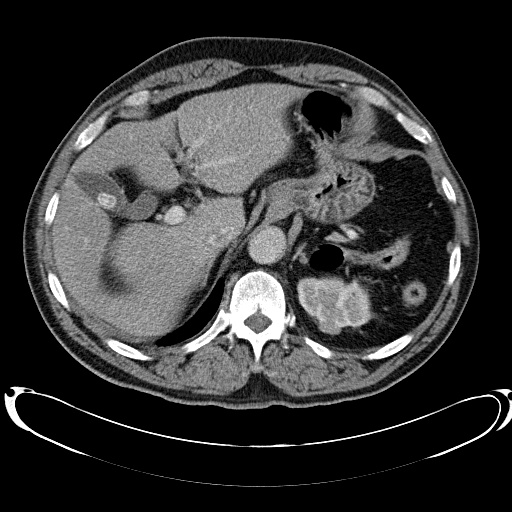
[im 5/62  lung]
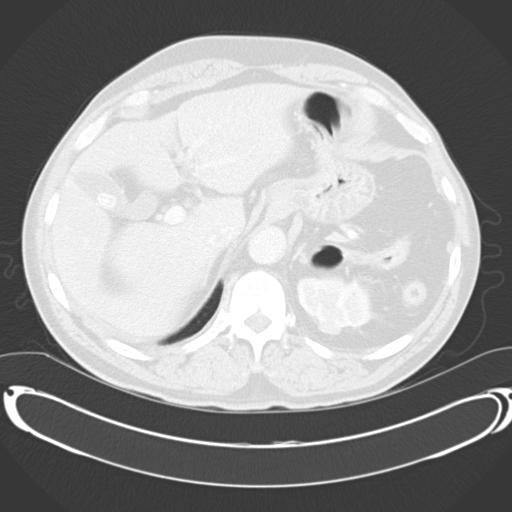
[im 10/62  lung]
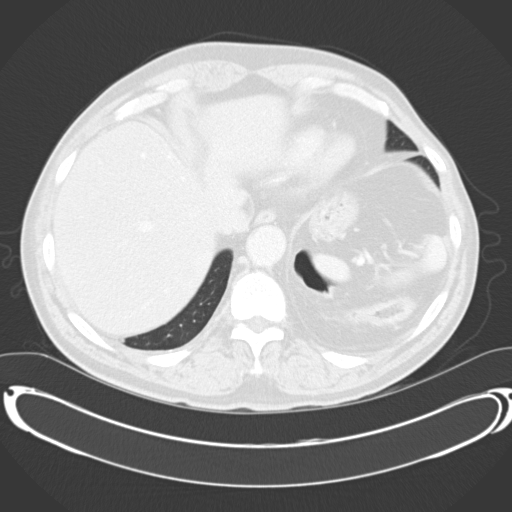
[im 15/62  lung]
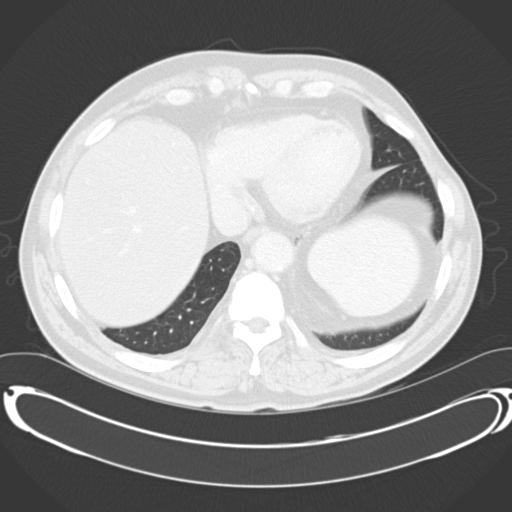
[im 19/62  lung]
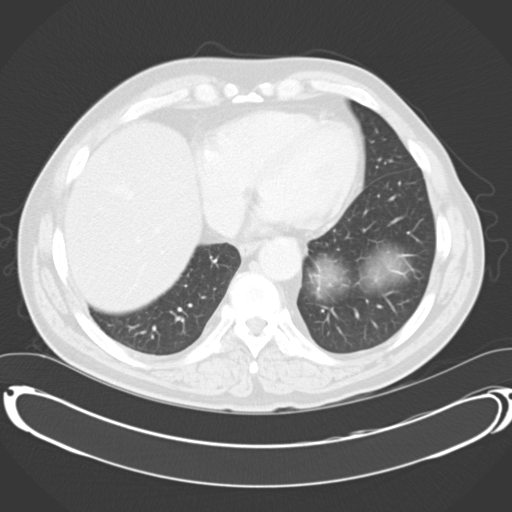
[im 24/62  mediastinal]
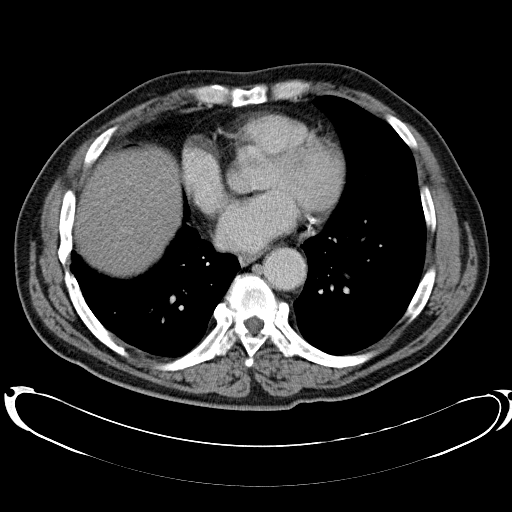
[im 24/62  lung]
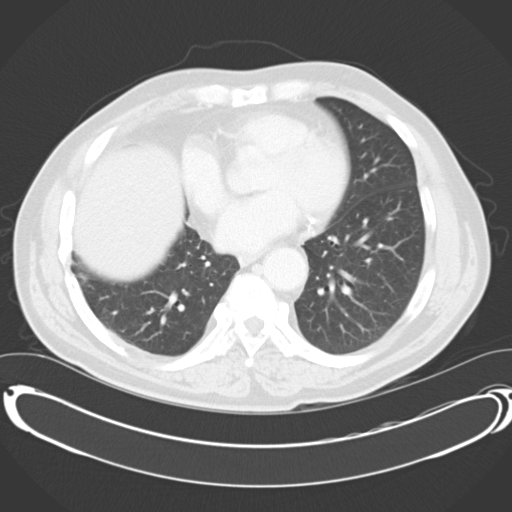
[im 29/62  lung]
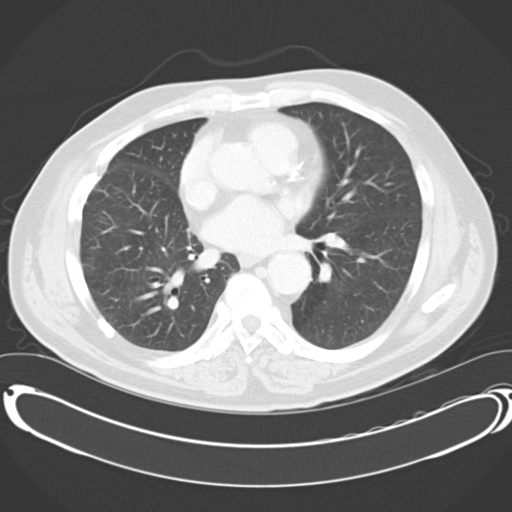
[im 33/62  lung]
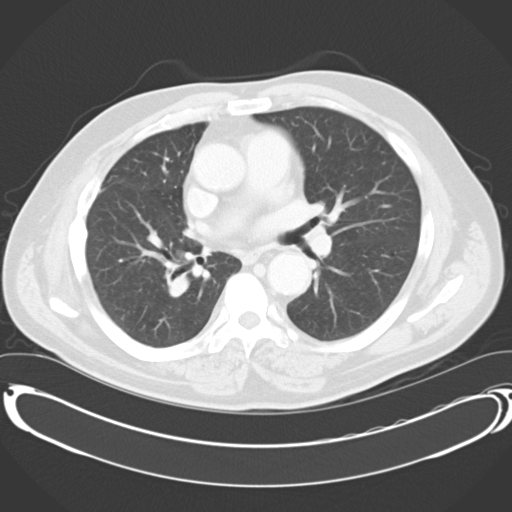
[im 38/62  lung]
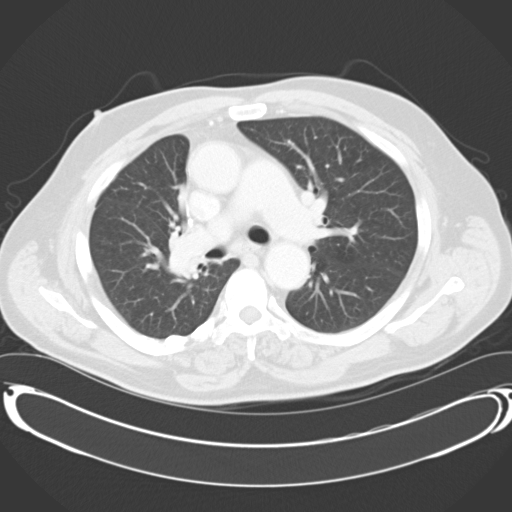
[im 43/62  mediastinal]
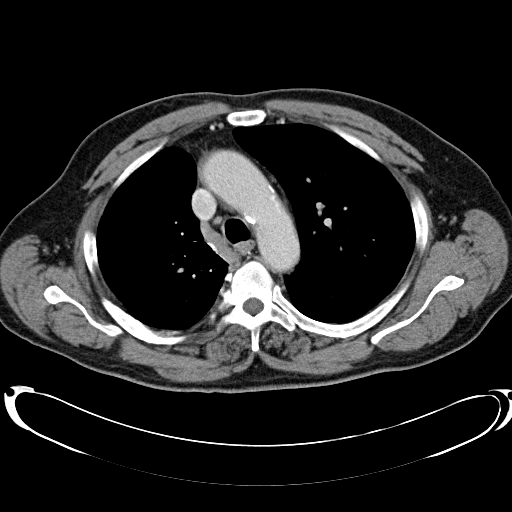
[im 43/62  lung]
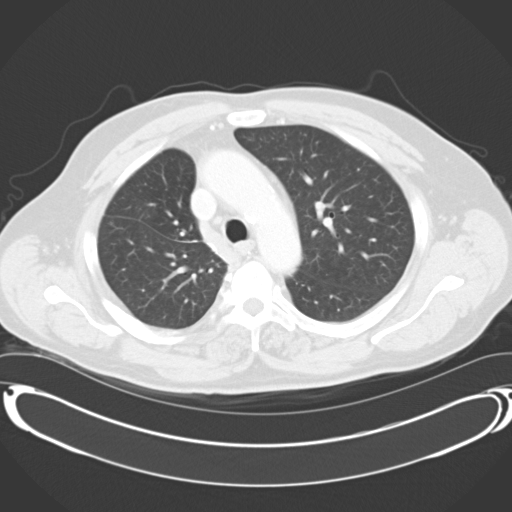
[im 47/62  lung]
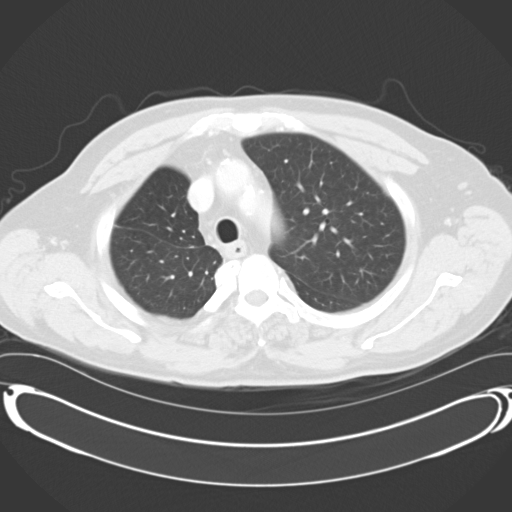
[im 52/62  lung]
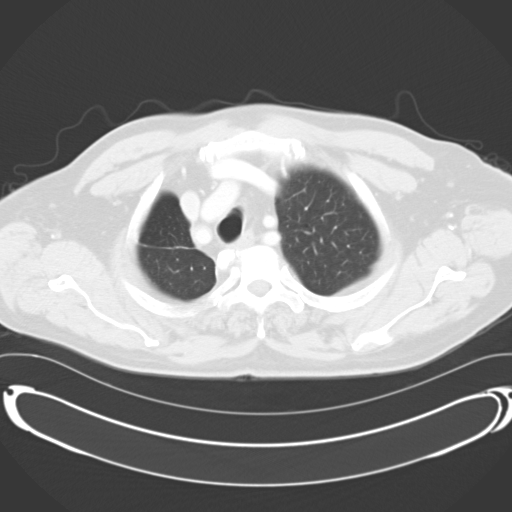
[im 57/62  lung]
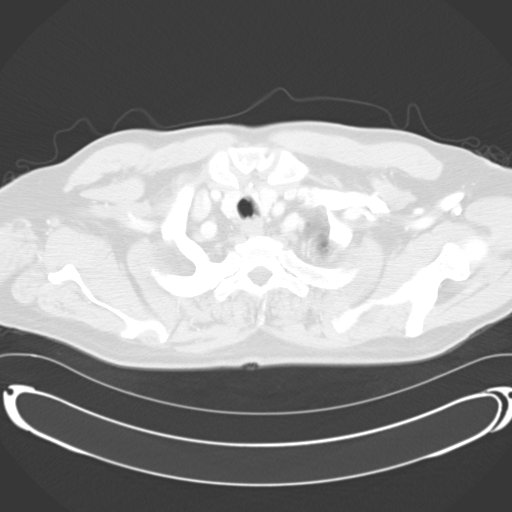

[Series 602: <mpr thick range> · coronal · 0.79mm/px · 3 of 95 slices shown]
[im 19/95  lung]
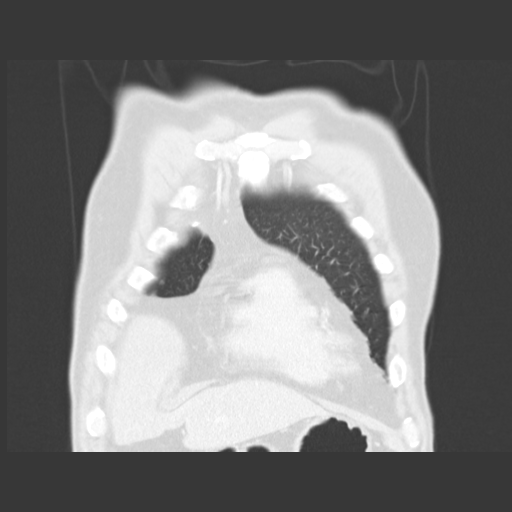
[im 38/95  lung]
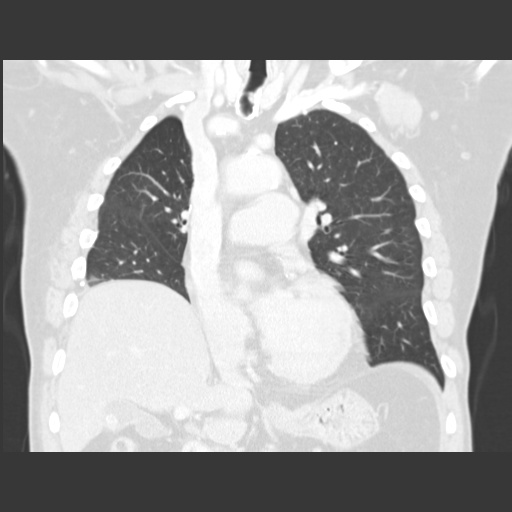
[im 57/95  lung]
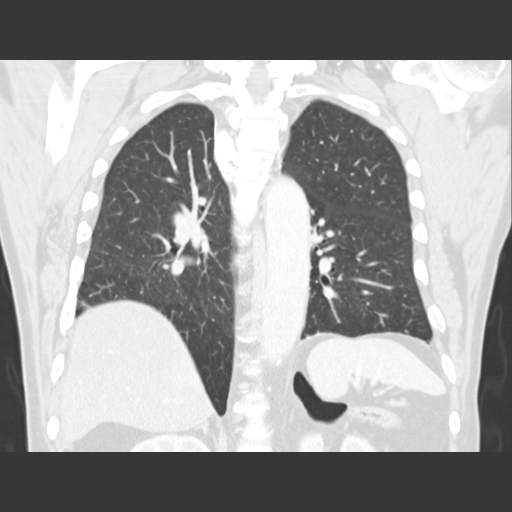

[15 of 36 positions shown; findings below may reference images not displayed]

FINDINGS: No enlarged axillary or supraclavicular lymph nodes.

There is no enlarged mediastinal or hilar adenopathy.

No pericardial or pleural effusion.

Postsurgical changes and volume loss involving the right lung again
noted.

There are no focal areas of residual or recurrent tumor within the
right lung identified.

The left lung appears within normal limits.

Review of the visualized osseous structures shows chronic healed
postoperative changes involving the right ribs.

The there is no worrisome lytic or sclerotic bone lesions
identified.

Calcified stone within the gallbladder lumen identified.3

Hyperdense cyst within the upper pole of the left kidney is again
noted.
IMPRESSION: 1.  Stable chest CT.  No specific features to suggest residual or
recurrence of tumor.

## 2013-06-19 ENCOUNTER — Other Ambulatory Visit: Payer: Self-pay | Admitting: Internal Medicine

## 2013-06-21 ENCOUNTER — Other Ambulatory Visit: Payer: Self-pay | Admitting: *Deleted

## 2013-06-21 MED ORDER — HYDROCODONE-ACETAMINOPHEN 7.5-325 MG PO TABS: 1.0000 | ORAL_TABLET | Freq: Four times a day (QID) | ORAL | Status: DC | PRN

## 2013-06-21 NOTE — Telephone Encounter (Signed)
Last refill 11/26 Pt # [phone]

## 2013-06-23 NOTE — Telephone Encounter (Signed)
Pt informed Rx is ready 

## 2013-07-13 ENCOUNTER — Other Ambulatory Visit: Payer: Self-pay | Admitting: *Deleted

## 2013-07-13 MED ORDER — ROSUVASTATIN CALCIUM 20 MG PO TABS: 20.0000 mg | ORAL_TABLET | Freq: Every day | ORAL | Status: DC

## 2013-07-19 ENCOUNTER — Other Ambulatory Visit: Payer: Self-pay | Admitting: Internal Medicine

## 2013-07-23 ENCOUNTER — Other Ambulatory Visit: Payer: Self-pay | Admitting: Internal Medicine

## 2013-07-23 ENCOUNTER — Other Ambulatory Visit: Payer: Self-pay | Admitting: *Deleted

## 2013-07-23 MED ORDER — HYDROCODONE-ACETAMINOPHEN 7.5-325 MG PO TABS: 1.0000 | ORAL_TABLET | Freq: Four times a day (QID) | ORAL | Status: DC | PRN

## 2013-07-23 NOTE — Telephone Encounter (Signed)
You may do 3 scripts for feb, mar and April if you would like

## 2013-07-26 NOTE — Telephone Encounter (Signed)
Pt informed

## 2013-08-03 IMAGING — CR DG HIP (WITH OR WITHOUT PELVIS) 2-3V*L*
3 series · 3 of 3 positions shown · non-contrast
Comparison: MRI 02/13/2009

CLINICAL DATA: Left hip pain, no injury

LEFT HIP - COMPLETE 2+ VIEW

[t pelvis a.p.]
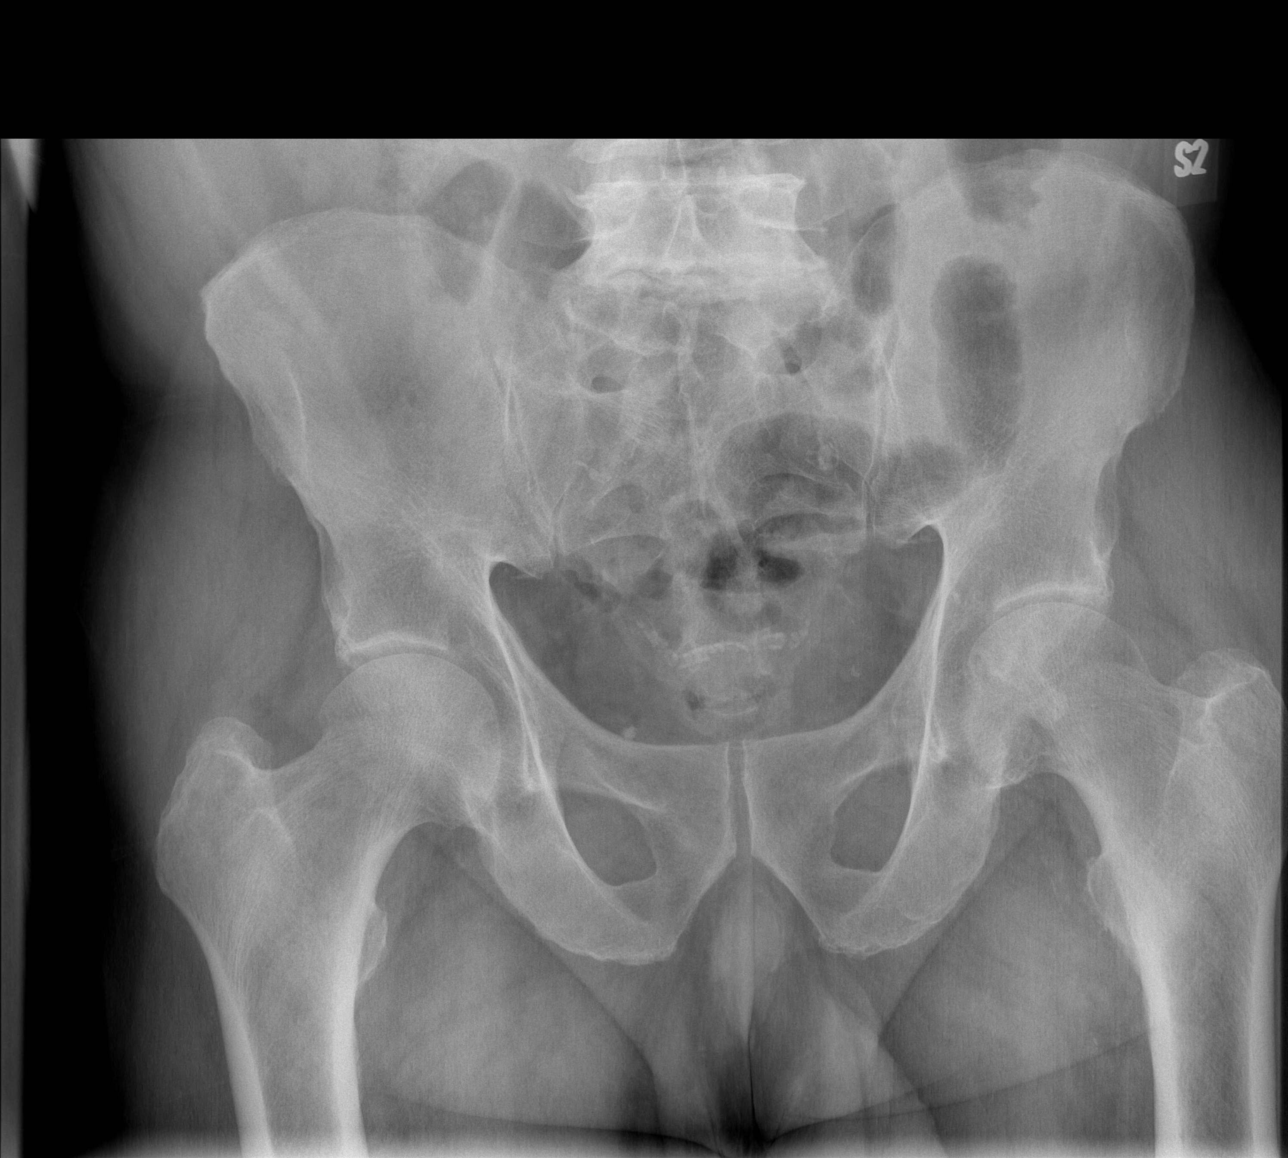

[t hip ap left]
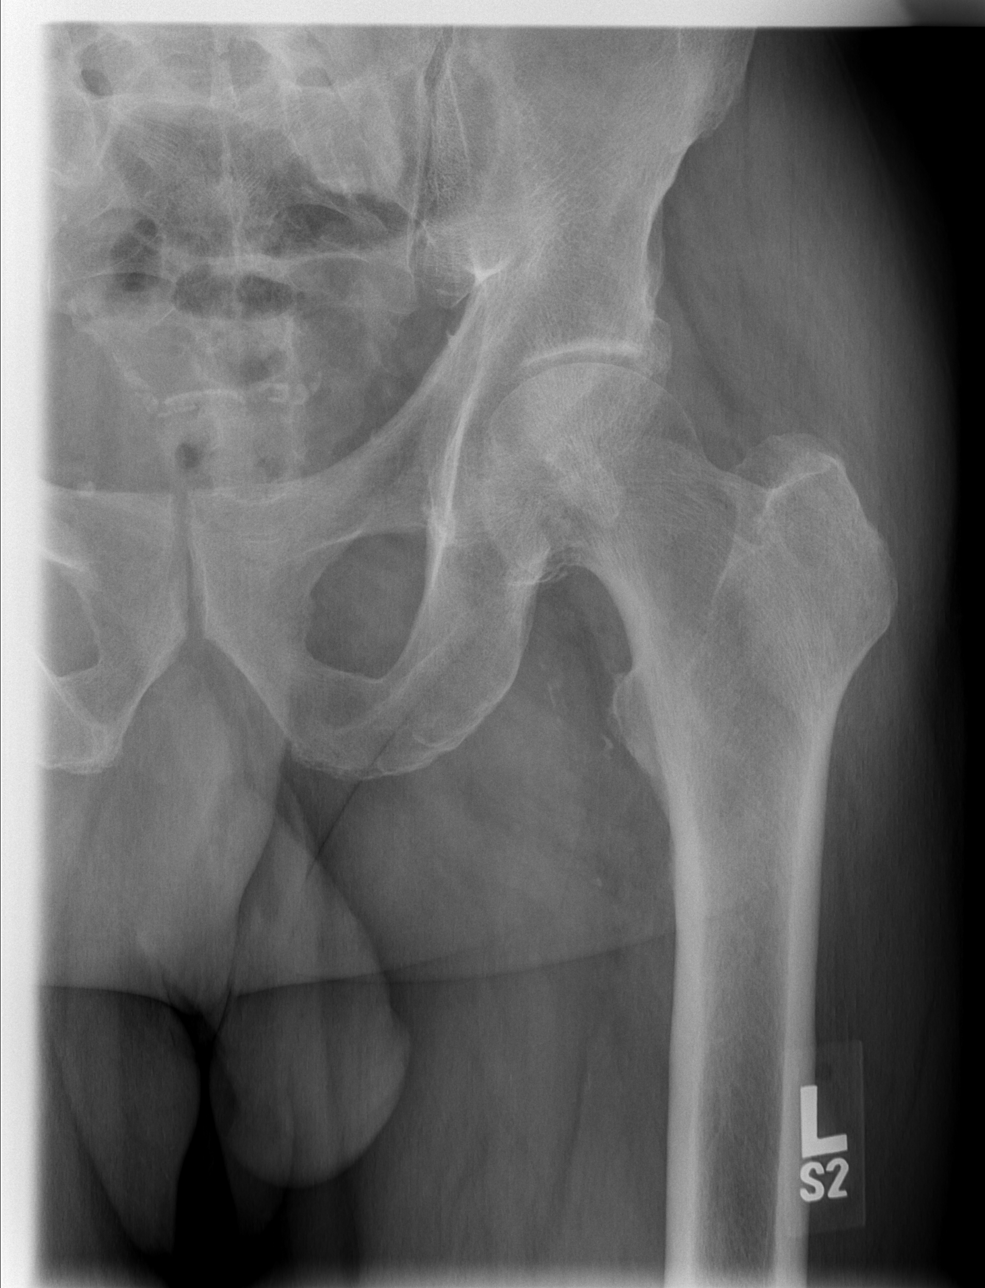

[t hip frog leg left]
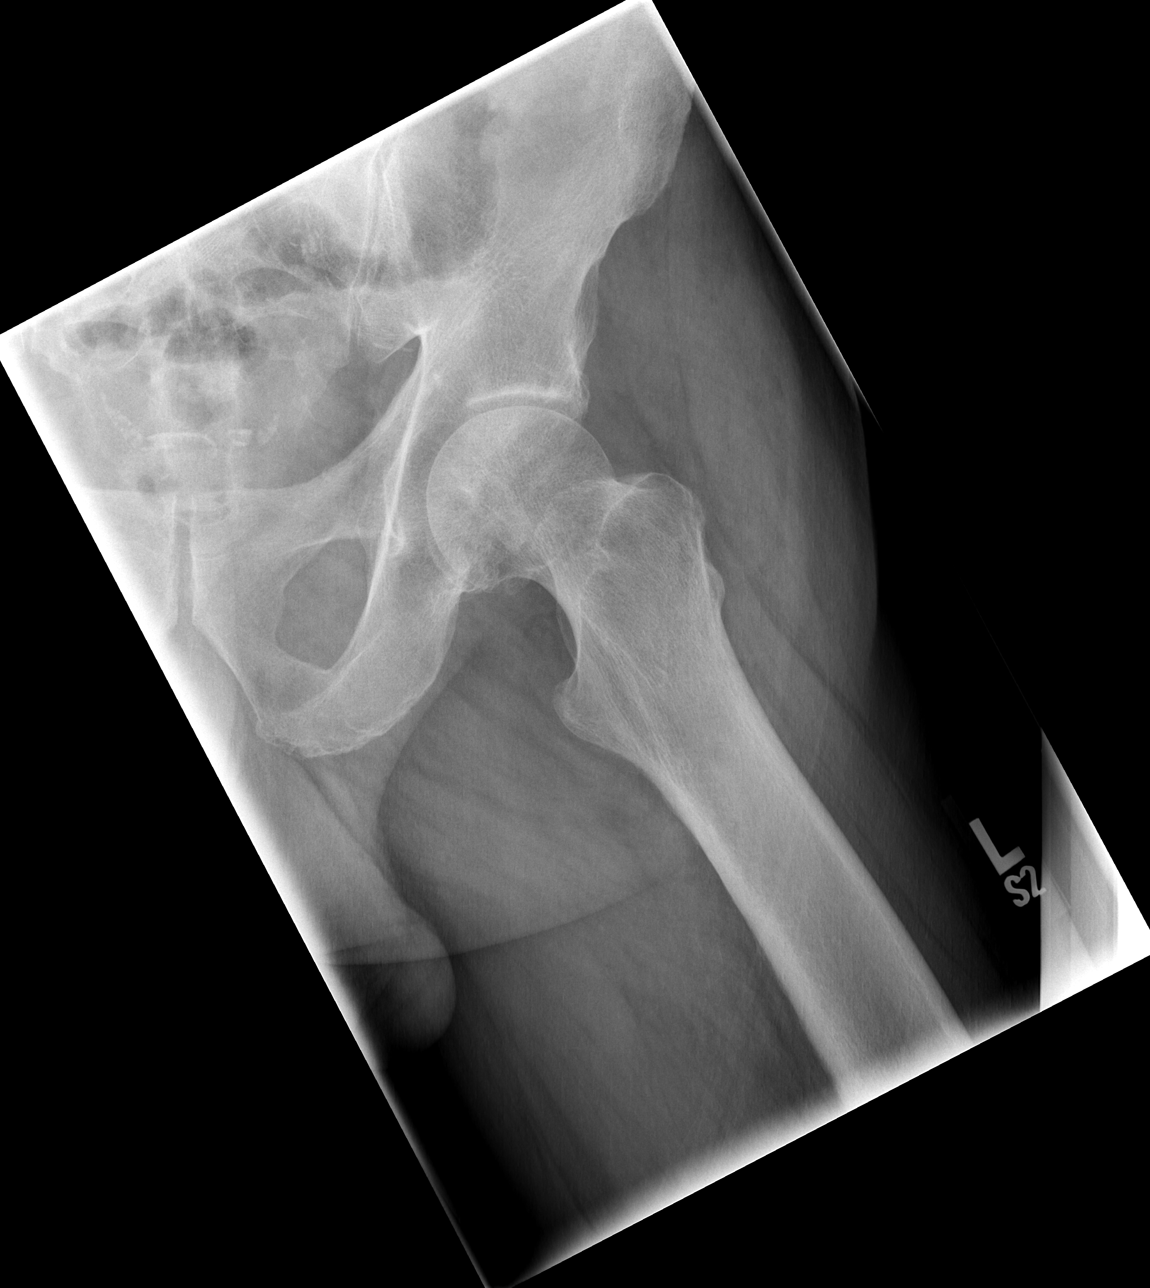

[3 of 3 positions shown; findings below may reference images not displayed]

FINDINGS: Negative for fracture.  Negative for avascular necrosis.
Joint space is normal without spurring.
IMPRESSION: Negative

## 2013-08-06 ENCOUNTER — Other Ambulatory Visit: Payer: Self-pay | Admitting: Internal Medicine

## 2013-10-01 ENCOUNTER — Other Ambulatory Visit: Payer: Self-pay | Admitting: *Deleted

## 2013-10-01 NOTE — Telephone Encounter (Signed)
Last filled 07/23/13

## 2013-10-04 ENCOUNTER — Other Ambulatory Visit: Payer: Self-pay | Admitting: Internal Medicine

## 2013-10-06 MED ORDER — HYDROCODONE-ACETAMINOPHEN 7.5-325 MG PO TABS: 1.0000 | ORAL_TABLET | Freq: Four times a day (QID) | ORAL | Status: DC | PRN

## 2013-10-06 NOTE — Telephone Encounter (Signed)
Pt called to get Rx

## 2013-10-08 ENCOUNTER — Other Ambulatory Visit: Payer: Self-pay | Admitting: Internal Medicine

## 2013-10-16 ENCOUNTER — Other Ambulatory Visit: Payer: Self-pay | Admitting: Internal Medicine

## 2013-10-19 ENCOUNTER — Encounter: Payer: Medicare HMO | Admitting: Internal Medicine

## 2013-10-20 NOTE — Telephone Encounter (Signed)
Patient was a no show on his visit yesterday. I would like to evaluate continued use of lasix - does he need that? Especially since lasix is a risk factor for further gout attacks. Please schedule a visit for him.

## 2013-11-03 ENCOUNTER — Other Ambulatory Visit: Payer: Self-pay | Admitting: *Deleted

## 2013-11-05 NOTE — Telephone Encounter (Signed)
Called pt, scheduled appt w/ dr Eyvonne Mechanic 5/26

## 2013-11-05 NOTE — Telephone Encounter (Signed)
No show with Dr. Ellwood Dense.  Needs to keep an app't.

## 2013-11-09 ENCOUNTER — Ambulatory Visit (INDEPENDENT_AMBULATORY_CARE_PROVIDER_SITE_OTHER): Payer: Commercial Managed Care - HMO | Admitting: Internal Medicine

## 2013-11-09 ENCOUNTER — Encounter: Payer: Self-pay | Admitting: Internal Medicine

## 2013-11-09 VITALS — BP 146/88 | HR 67 | Temp 97.5°F | Ht 73.5 in | Wt 196.3 lb

## 2013-11-09 DIAGNOSIS — M109 Gout, unspecified: Secondary | ICD-10-CM

## 2013-11-09 DIAGNOSIS — I714 Abdominal aortic aneurysm, without rupture, unspecified: Secondary | ICD-10-CM

## 2013-11-09 DIAGNOSIS — I10 Essential (primary) hypertension: Secondary | ICD-10-CM

## 2013-11-09 MED ORDER — COLCHICINE 0.6 MG PO TABS: ORAL_TABLET | ORAL | Status: DC

## 2013-11-09 MED ORDER — HYDROCODONE-ACETAMINOPHEN 7.5-325 MG PO TABS: 1.0000 | ORAL_TABLET | Freq: Four times a day (QID) | ORAL | Status: DC | PRN

## 2013-11-09 NOTE — Assessment & Plan Note (Signed)
Repeat US from December 2014 showed no change in the size of AAA.  Plans: Repeat US every 12 months.

## 2013-11-09 NOTE — Assessment & Plan Note (Signed)
BP slightly elevated but patient reported that he just took all the tablets.  Plans: Continue current regimen. Check BMP to monitor Cr and electrolytes.

## 2013-11-09 NOTE — Patient Instructions (Signed)
Take all the medications as recommended.

## 2013-11-09 NOTE — Progress Notes (Signed)
Subjective:   Patient ID: [name] male   DOB: 24-Sep-1945 68 y.o.   MRN: [id number]  HPI: [name] is a 68 y.o. gentleman with PMH significant for HTN, CAD, Gout, H/O Lung cancer in remission comes to the office for medication refill.  Patient is requesting refills on Norco, Colcrys. Patient reports that he still has "gout pains" in his knee, elbow and left foot first MTP. Patient is denies any swelling, severe pain of any joint. Patient reports that he takes Norco for pain when he gets gout flares.   Patient denies any other complaints during this office visit.  Past Medical History  Diagnosis Date  . Diverticulosis of colon   . GERD (gastroesophageal reflux disease)   . Gout   . Hypertension   . Hyperlipidemia   . Renal failure, acute     hx of 2/2 medication, resolved  . Barretts esophagus     bx distal esophagus 2011 neg   . Tobacco abuse   . Alcohol abuse   . Hx of adenomatous colonic polyps   . CHF (congestive heart failure)   . Hiatal hernia     2 cm noted on upper endos 2011   . Cataract   . Internal hemorrhoid   . DJD (degenerative joint disease)     left hip  . Myocardial infarction     hx of, 2 stents 2004. pt was on Plavix x 3 years then transitioned to ASA  . 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 Cataract   . Cholelithiasis   . UTI (lower urinary tract infection)   . AAA (abdominal aortic aneurysm)     noted CT 11/2012 size 3.3 x 3.2 cm   Current Outpatient Prescriptions  Medication Sig Dispense Refill  . allopurinol (ZYLOPRIM) 100 MG tablet TAKE 1 TABLET (100 MG TOTAL) BY MOUTH DAILY.  30 tablet  5  . amLODipine (NORVASC) 5 MG tablet TAKE ONE TABLET BY MOUTH ONCE DAILY  30 tablet  5  . aspirin EC 81 MG tablet Take 81 mg by mouth daily.      Marland Kitchen COLCRYS 0.6 MG tablet TAKE 1 TABLET (0.6 MG TOTAL) BY MOUTH 2 (TWO) TIMES DAILY AS NEEDED.  30 tablet  0  . furosemide (LASIX) 20 MG tablet TAKE 1 TABLET  (20 MG TOTAL) BY MOUTH DAILY.  30 tablet  1  . HYDROcodone-acetaminophen (NORCO) 7.5-325 MG per tablet Take 1 tablet by mouth every 6 (six) hours as needed for moderate pain.  120 tablet  0  . metoprolol (LOPRESSOR) 50 MG tablet TAKE 1/2 TABLET BY MOUTH TWICE DAILY  31 tablet  5  . omeprazole (PRILOSEC) 20 MG capsule TAKE 1 CAPSULE (20 MG TOTAL) BY MOUTH 2 (TWO) TIMES DAILY.  60 capsule  5  . rosuvastatin (CRESTOR) 20 MG tablet Take 1 tablet (20 mg total) by mouth daily.  30 tablet  5   No current facility-administered medications for this visit.   Family History  Problem Relation Age of Onset  . Diabetes Mother   . Hypertension Sister   . Heart disease Sister   . Diabetes Sister   . Arthritis Brother   . Gout Brother   . Colon cancer Neg Hx   . Esophageal cancer Neg Hx   . Rectal cancer Neg Hx   . Stomach cancer Neg Hx    History   Social History  . Marital Status: Single  Spouse Name: N/A    Number of Children: N/A  . Years of Education: N/A   Social History Main Topics  . Smoking status: Current Every Day Smoker -- 0.50 packs/day    Types: Cigarettes  . Smokeless tobacco: Never Used     Comment: trying to cut down.  . Alcohol Use: Yes     Comment: drinking 1 pt gin qweek, (mixed with tap water not tonic)  . Drug Use: No  . Sexual Activity: None   Other Topics Concern  . None   Social History Narrative  . None   Review of Systems: Pertinent items are noted in HPI. Objective:  Physical Exam: Filed Vitals:   11/09/13 0822  BP: 146/88  Pulse: 67  Temp: 97.5 F (36.4 C)  TempSrc: Oral  Height: 6' 1.5" (1.867 m)  Weight: 196 lb 4.8 oz (89.041 kg)  SpO2: 97%   Constitutional: Vital signs reviewed.  Patient is a well-developed No rash, cyanosis, or clubbing.  acute distress and cooperative with exam. Alert and oriented x3.  Head: Normocephalic and atraumatic Cardiovascular: RRR, S1 normal, loud S2. no MRG. Pulmonary/Chest: normal respiratory effort, CTAB,  no wheezes, rales, or rhonchi Musculoskeletal: No joint deformities, erythema, or stiffness, ROM full and no nontender Extremities: No pedal edema. Neurological: A&O x3 Skin: Warm, dry and intact.  Psychiatric: Normal mood and affect.   Assessment & Plan:

## 2013-11-09 NOTE — Telephone Encounter (Signed)
Thnak you, I agree.

## 2013-11-09 NOTE — Assessment & Plan Note (Addendum)
No signs of acute attacks.  Plans: Check serum uric acid to monitor levels and to adjust allopurinol dosing. Continue current regimen.  Addendum: Uric acid level is 5.5 and is within the therapeutic target limits. Continue current strength of Allopurinol.

## 2013-11-09 NOTE — Progress Notes (Signed)
INTERNAL MEDICINE TEACHING ATTENDING ADDENDUM - Jacquetta Polhamus, MD: I reviewed and discussed with the resident Dr. Boggala, the patient's medical history, physical examination, diagnosis and results of pertinent tests and treatment and I agree with the patient's care as documented. 

## 2013-11-10 LAB — BASIC METABOLIC PANEL WITH GFR
BUN: 17 mg/dL (ref 6–23)
CALCIUM: 9.1 mg/dL (ref 8.4–10.5)
CO2: 28 meq/L (ref 19–32)
CREATININE: 1.24 mg/dL (ref 0.50–1.35)
Chloride: 106 mEq/L (ref 96–112)
GFR, Est African American: 69 mL/min
GFR, Est Non African American: 60 mL/min
Glucose, Bld: 94 mg/dL (ref 70–99)
Potassium: 4.5 mEq/L (ref 3.5–5.3)
Sodium: 142 mEq/L (ref 135–145)

## 2013-11-10 LAB — URIC ACID: URIC ACID, SERUM: 5.5 mg/dL (ref 4.0–7.8)

## 2013-12-08 ENCOUNTER — Other Ambulatory Visit: Payer: Self-pay | Admitting: *Deleted

## 2013-12-08 DIAGNOSIS — M109 Gout, unspecified: Secondary | ICD-10-CM

## 2013-12-09 MED ORDER — HYDROCODONE-ACETAMINOPHEN 7.5-325 MG PO TABS: 1.0000 | ORAL_TABLET | Freq: Four times a day (QID) | ORAL | Status: DC | PRN

## 2013-12-16 ENCOUNTER — Other Ambulatory Visit: Payer: Self-pay | Admitting: Internal Medicine

## 2014-01-01 ENCOUNTER — Other Ambulatory Visit: Payer: Self-pay | Admitting: Internal Medicine

## 2014-01-08 ENCOUNTER — Emergency Department (HOSPITAL_COMMUNITY)
Admission: EM | Admit: 2014-01-08 | Discharge: 2014-01-08 | Disposition: A | Discharge status: Home / Self Care | Payer: Medicare PPO | Attending: Emergency Medicine | Admitting: Emergency Medicine

## 2014-01-08 ENCOUNTER — Encounter (HOSPITAL_COMMUNITY): Payer: Self-pay | Admitting: Emergency Medicine

## 2014-01-08 ENCOUNTER — Emergency Department (HOSPITAL_COMMUNITY): Payer: Medicare PPO

## 2014-01-08 DIAGNOSIS — F17200 Nicotine dependence, unspecified, uncomplicated: Secondary | ICD-10-CM | POA: Diagnosis not present

## 2014-01-08 DIAGNOSIS — Z87440 Personal history of urinary (tract) infections: Secondary | ICD-10-CM | POA: Diagnosis not present

## 2014-01-08 DIAGNOSIS — Z860100 Personal history of colon polyps, unspecified: Secondary | ICD-10-CM | POA: Insufficient documentation

## 2014-01-08 DIAGNOSIS — Z87448 Personal history of other diseases of urinary system: Secondary | ICD-10-CM | POA: Insufficient documentation

## 2014-01-08 DIAGNOSIS — Y9289 Other specified places as the place of occurrence of the external cause: Secondary | ICD-10-CM | POA: Insufficient documentation

## 2014-01-08 DIAGNOSIS — Y9389 Activity, other specified: Secondary | ICD-10-CM | POA: Diagnosis not present

## 2014-01-08 DIAGNOSIS — Z7982 Long term (current) use of aspirin: Secondary | ICD-10-CM | POA: Diagnosis not present

## 2014-01-08 DIAGNOSIS — I252 Old myocardial infarction: Secondary | ICD-10-CM | POA: Insufficient documentation

## 2014-01-08 DIAGNOSIS — I509 Heart failure, unspecified: Secondary | ICD-10-CM | POA: Insufficient documentation

## 2014-01-08 DIAGNOSIS — S59919A Unspecified injury of unspecified forearm, initial encounter: Secondary | ICD-10-CM

## 2014-01-08 DIAGNOSIS — R296 Repeated falls: Secondary | ICD-10-CM | POA: Diagnosis not present

## 2014-01-08 DIAGNOSIS — Z79899 Other long term (current) drug therapy: Secondary | ICD-10-CM | POA: Insufficient documentation

## 2014-01-08 DIAGNOSIS — S46312A Strain of muscle, fascia and tendon of triceps, left arm, initial encounter: Secondary | ICD-10-CM

## 2014-01-08 DIAGNOSIS — M1990 Unspecified osteoarthritis, unspecified site: Secondary | ICD-10-CM | POA: Insufficient documentation

## 2014-01-08 DIAGNOSIS — IMO0002 Reserved for concepts with insufficient information to code with codable children: Secondary | ICD-10-CM | POA: Diagnosis not present

## 2014-01-08 DIAGNOSIS — M25522 Pain in left elbow: Secondary | ICD-10-CM

## 2014-01-08 DIAGNOSIS — S59909A Unspecified injury of unspecified elbow, initial encounter: Secondary | ICD-10-CM | POA: Diagnosis present

## 2014-01-08 DIAGNOSIS — M109 Gout, unspecified: Secondary | ICD-10-CM | POA: Insufficient documentation

## 2014-01-08 DIAGNOSIS — I10 Essential (primary) hypertension: Secondary | ICD-10-CM | POA: Insufficient documentation

## 2014-01-08 DIAGNOSIS — Z85118 Personal history of other malignant neoplasm of bronchus and lung: Secondary | ICD-10-CM | POA: Diagnosis not present

## 2014-01-08 DIAGNOSIS — E785 Hyperlipidemia, unspecified: Secondary | ICD-10-CM | POA: Diagnosis not present

## 2014-01-08 DIAGNOSIS — S6990XA Unspecified injury of unspecified wrist, hand and finger(s), initial encounter: Secondary | ICD-10-CM | POA: Diagnosis present

## 2014-01-08 DIAGNOSIS — K219 Gastro-esophageal reflux disease without esophagitis: Secondary | ICD-10-CM | POA: Insufficient documentation

## 2014-01-08 DIAGNOSIS — H269 Unspecified cataract: Secondary | ICD-10-CM | POA: Diagnosis not present

## 2014-01-08 NOTE — ED Provider Notes (Signed)
CSN: [id number]     Arrival date & time 01/08/14  1019 History  This chart was scribed for non-physician practitioner, Baron Sane, PA-C working with No att. providers found, by Erling Conte, ED Scribe. This patient was seen in room TR07C/TR07C and the patient's care was started at 11:02 AM.    Chief Complaint  Patient presents with  . Arm Injury     The history is provided by the patient. No language interpreter was used.   HPI Comments: [name] is a 68 y.o. male with a h/o CHF, HTN, Hyperlipidemia, DJD and lung cancer who presents to the Emergency Department complaining of an injury to his left arm. Patient states that he fell last week while working out in the yard. Pt reports that since the fall he has been having moderate, intermittent, pain in his left arm and swelling from his elbow down to his left hand. Patient states he has icing the area and he also states that he takes prescribed Norco for his chronic hip pain but is unsure if it has been helping his left arm pain. Patient denies any LOC from fall. He denies any previous in his left arm. He denies any history of blood clots in his extremities. He denies any numbness or laceration in his left arm.  Past Medical History  Diagnosis Date  . Diverticulosis of colon   . GERD (gastroesophageal reflux disease)   . Gout   . Hypertension   . Hyperlipidemia   . Renal failure, acute     hx of 2/2 medication, resolved  . Barretts esophagus     bx distal esophagus 2011 neg   . Tobacco abuse   . Alcohol abuse   . Hx of adenomatous colonic polyps   . CHF (congestive heart failure)   . Hiatal hernia     2 cm noted on upper endos 2011   . Cataract   . Internal hemorrhoid   . DJD (degenerative joint disease)     left hip  . Myocardial infarction     hx of, 2 stents 2004. pt was on Plavix x 3 years then transitioned to ASA  . 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 Cataract    . Cholelithiasis   . UTI (lower urinary tract infection)   . AAA (abdominal aortic aneurysm)     noted CT 11/2012 size 3.3 x 3.2 cm   Past Surgical History  Procedure Laterality Date  . Lung lobectomy  11/07    RUL  . Flexible cystourethroscopy  05/24/06  . Other surgical history      hernia repair 2005   . Hernia repair    . Colonoscopy    . Upper gastrointestinal endoscopy     Family History  Problem Relation Age of Onset  . Diabetes Mother   . Hypertension Sister   . Heart disease Sister   . Diabetes Sister   . Arthritis Brother   . Gout Brother   . Colon cancer Neg Hx   . Esophageal cancer Neg Hx   . Rectal cancer Neg Hx   . Stomach cancer Neg Hx    History  Substance Use Topics  . Smoking status: Current Every Day Smoker -- 0.50 packs/day    Types: Cigarettes  . Smokeless tobacco: Never Used     Comment: trying to cut down.  . Alcohol Use: Yes     Comment: drinking 1 pt gin qweek, (  mixed with tap water not tonic)    Review of Systems  Musculoskeletal: Positive for arthralgias (left elbow injury and pain) and joint swelling (swelling of left elbow).  Skin: Negative for wound.  Neurological: Negative for syncope and numbness (to left arm).  All other systems reviewed and are negative.     Allergies  Candesartan cilexetil-hctz; Hydrochlorothiazide; and Shellfish allergy  Home Medications   Prior to Admission medications   Medication Sig Start Date End Date Taking? Authorizing Provider  allopurinol (ZYLOPRIM) 100 MG tablet TAKE 1 TABLET (100 MG TOTAL) BY MOUTH DAILY. 08/06/13   Cresenciano Genre, MD  amLODipine (NORVASC) 5 MG tablet TAKE ONE TABLET BY MOUTH ONCE DAILY 10/04/13   Madilyn Fireman, MD  aspirin EC 81 MG tablet Take 81 mg by mouth daily.    Historical Provider, MD  colchicine (COLCRYS) 0.6 MG tablet Take 1 tablet twice daily as needed. 11/09/13   Carter Kitten, MD  furosemide (LASIX) 20 MG tablet TAKE 1 TABLET (20 MG TOTAL) BY MOUTH DAILY. 12/16/13    Madilyn Fireman, MD  HYDROcodone-acetaminophen (NORCO) 7.5-325 MG per tablet Take 1 tablet by mouth every 6 (six) hours as needed for moderate pain. 12/09/13   Madilyn Fireman, MD  metoprolol (LOPRESSOR) 50 MG tablet TAKE 1/2 TABLET BY MOUTH TWICE DAILY    Madilyn Fireman, MD  omeprazole (PRILOSEC) 20 MG capsule TAKE 1 CAPSULE (20 MG TOTAL) BY MOUTH 2 (TWO) TIMES DAILY.    Madilyn Fireman, MD  rosuvastatin (CRESTOR) 20 MG tablet Take 1 tablet (20 mg total) by mouth daily. 07/13/13   Cresenciano Genre, MD   BP 126/72  Pulse 70  Temp(Src) 98.1 F (36.7 C) (Oral)  Resp 16  SpO2 100%  Physical Exam  Nursing note and vitals reviewed. Constitutional: He is oriented to person, place, and time. He appears well-developed and well-nourished. No distress.  HENT:  Head: Normocephalic and atraumatic.  Right Ear: External ear normal.  Left Ear: External ear normal.  Nose: Nose normal.  Mouth/Throat: Oropharynx is clear and moist.  Eyes: Conjunctivae are normal.  Neck: Normal range of motion. Neck supple.  Cardiovascular: Normal rate, regular rhythm, normal heart sounds and intact distal pulses.  Exam reveals no gallop and no friction rub.   No murmur heard. Pulmonary/Chest: Effort normal and breath sounds normal.  Abdominal: Soft.  Musculoskeletal: Normal range of motion.       Left shoulder: Normal.       Right elbow: Normal.      Left elbow: He exhibits swelling. He exhibits normal range of motion, no deformity and no laceration. Tenderness found. Olecranon process tenderness noted.       Left wrist: Normal.       Right forearm: Normal.       Left forearm: He exhibits tenderness and swelling. He exhibits no deformity and no laceration.  Neurological: He is alert and oriented to person, place, and time.  Skin: Skin is warm and dry. He is not diaphoretic.  Psychiatric: He has a normal mood and affect.    ED Course  Procedures (including critical care time)  DIAGNOSTIC STUDIES: Oxygen  Saturation is 100% on RA, normal by my interpretation.    COORDINATION OF CARE: 11:11 AM- Will order diagnostic imaging of left elbow. Pt advised of plan for treatment and pt agrees.      Labs Review Labs Reviewed - No data to display  Imaging Review Dg Elbow Complete Left  01/08/2014   CLINICAL DATA:  [place].  Persistent elbow pain.  EXAM: LEFT ELBOW - COMPLETE 3+ VIEW  COMPARISON:  None.  FINDINGS: The joint spaces are maintained. No acute fracture is identified. No joint effusion. There is significant soft tissue swelling posteriorly along the distal humerus and around the olecranon. Small calcifications are noted approximately 2-1/2 cm from a olecranon. These could be dystrophic calcifications but findings are somewhat suspicious for a triceps tendon tear. Recommend correlation with clinical findings. MRI may be helpful for further evaluation if clinically indicated.  IMPRESSION: No acute fracture.  Mild elbow joint degenerative changes but no joint effusion.  Significant posterior soft tissue swelling involving the distal arm and elbow with small calcifications noted in the soft tissues. Findings suspicious for a triceps tendon tear. Recommend correlation with clinical findings. MRI may be helpful for further evaluation.   Electronically Signed   By: Kalman Jewels M.D.   On: 01/08/2014 12:09     EKG Interpretation None      MDM   Final diagnoses:  Left elbow pain  Ruptured triceps tendon, left, initial encounter    Filed Vitals:   01/08/14 1258  BP: 148/79  Pulse: 62  Temp: 97.6 F (36.4 C)  Resp: 16   Afebrile, NAD, non-toxic appearing, AAOx4.  Neurovascularly intact. Normal sensation. Patient X-Ray negative for obvious fracture or dislocation. Concerning for possible tricep tendon rupture, will place in posterior long arm splint. Pain free in ED. Pt advised to follow up with orthopedics. Patient given splint while in ED, conservative therapy recommended and discussed.  Patient will be dc home & is agreeable with above plan. Patient is stable at time of discharge Patient d/w with Dr. Christy Gentles, agrees with plan.      I personally performed the services described in this documentation, which was scribed in my presence. The recorded information has been reviewed and is accurate.      Harlow Mares, PA-C 01/08/14 1445

## 2014-01-08 NOTE — ED Notes (Signed)
Ortho at bed side to appy splint.

## 2014-01-08 NOTE — Discharge Instructions (Signed)
Please follow up with your primary care physician in 1-2 days. If you do not have one please call the [facility] number listed above. Please use your at home pain medication as prescribed to you by your primary care physician. Please follow up with Dr. Tonita Cong to schedule a follow up appointment. Please read all discharge instructions and return precautions.   Triceps Tendon Rupture with Rehab The triceps muscle is located on the backside of the upper arm and is responsible for straightening the elbow and extending the upper arm backwards. A triceps tendon rupture is a complete tear of the tendon that attaches the triceps muscle to the ulna (one of the forearm bones). A triceps tendon rupture results in decreased triceps function. SYMPTOMS   Pain, tenderness, inflammation and/or bruising over the injury (contusion).  "Popping" or tearing sensation felt and/or heard in the elbow at the time of injury.  Decreased ability to straighten the elbow or extend the shoulder.  A crackling sound (crepitation) when the tendon is moved or touched.  Loss of firm fullness when pushing on the area where the tendon ruptured. CAUSES  Triceps tendon ruptures occur when a force is placed on the tendon that is greater than it can withstand. Common mechanisms of injury include:  Stress on the tendon from a sudden increase in intensity, frequency or duration of training.  Direct trauma to the tendon.  A cut (laceration) of the tendon. RISK INCREASES WITH:  Activities that involve repetitive movements and/or straightening of the elbow or extension of the shoulder (weightlifting or push-ups).  Poor strength and flexibility.  The use of steroids.  Previous use of corticosteroid injections.  Incomplete treatment of triceps tendinitis.  Previous triceps tendon injury. PREVENTION  Warm up and stretch properly before activity.  Allow for adequate recovery between workouts.  Maintain  physical fitness:  Strength, flexibility, and endurance.  Cardiovascular fitness.  Learn and use proper technique, especially regarding training program design. When possible, have coach correct improper technique. PROGNOSIS  If treated properly, the symptoms of a ruptured triceps tendon usually resolve within 6 to 9 months, after which return to sports is allowed. RELATED COMPLICATIONS   Permanent elbow weakness.  Re-rupture of the tendon after treatment.  Prolonged disability.  Risks of surgery: infection, bleeding, nerve damage or damage to surrounding tissues. TREATMENT Treatment initially involves resting from any activities that aggravate the symptoms, and the use of ice and medications to help reduce pain and inflammation. A triceps tendon rupture will not heal if left untreated. Definitive treatment requires surgery to reattach the tendon. After surgery the elbow and shoulder must be immobilized to allow for healing. After immobilization it is important to perform strengthening and stretching exercises to help regain strength and a full range of motion. These exercises may be completed at home or with a therapist. MEDICATION  If pain medication is necessary, then nonsteroidal anti-inflammatory medications, such as aspirin and ibuprofen, or other minor pain relievers, such as acetaminophen, are often recommended.  Do not take pain medication within 7 days before surgery.  Prescription pain relievers may be given if deemed necessary by your caregiver. Use only as directed and only as much as you need. COLD THERAPY  Cold treatment (icing) relieves pain and reduces inflammation. Cold treatment should be applied for 10 to 15 minutes every 2 to 3 hours for inflammation and pain and immediately after any activity that aggravates your symptoms. Use ice packs or massage the area with a piece  of ice (ice massage). SEEK MEDICAL CARE IF:  Treatment seems to offer no benefit, or the  condition worsens.  Any medications produce adverse side effects.  Any complications from surgery occur including:  Pain, numbness, or coldness in the extremity operated upon.  Discoloration of the nail beds (they become blue or gray) of the extremity operated upon.  Signs of infections (fever, pain, inflammation, redness, or persistent bleeding). Splint Care Splints protect and rest injuries. Splints can be made of plaster, fiberglass, or metal. They are used to treat broken bones, sprains, tendonitis, and other injuries. HOME CARE  Keep the injured area raised (elevated) while sitting or lying down. Keep the injured body part just above the level of the heart. This will decrease puffiness (swelling) and pain.  If an elastic bandage was used to hold the splint, it can be loosened. Only loosen it to make room for puffiness and to ease pain.  Keep the splint clean and dry.  Do not scratch the skin under the splint with sharp or pointed objects.  Follow up with your doctor as told. GET HELP RIGHT AWAY IF:   There is more pain or pressure around the injury.  There is numbness, tingling, or pain in the toes or fingers past the injury.  The fingers or toes become cold or blue.  The splint becomes too soft or breaks before the injury is healed. MAKE SURE YOU:   Understand these instructions.  Will watch this condition.  Will get help right away if you are not doing well or get worse. Document Released: 03/12/2008 Document Revised: 08/26/2011 Document Reviewed: 03/12/2008 [facility] Patient Information 2015 [place], [place]. This information is not intended to replace advice given to you by your health care provider. Make sure you discuss any questions you have with your health care provider.

## 2014-01-08 NOTE — ED Provider Notes (Signed)
Medical screening examination/treatment/procedure(s) were conducted as a shared visit with non-physician practitioner(s) and myself.  I personally evaluated the patient during the encounter.   EKG Interpretation None        Sharyon Cable, MD 01/08/14 [phone]

## 2014-01-08 NOTE — ED Notes (Signed)
ortho called for splint.

## 2014-01-08 NOTE — Progress Notes (Signed)
Orthopedic Tech Progress Note Patient Details:  [name] 1945/10/27 [id number]  Ortho Devices Type of Ortho Device: Arm sling;Long arm splint;Ace wrap Ortho Device/Splint Interventions: Application   Cammer, Theodoro Parma 01/08/2014, 1:00 PM

## 2014-01-08 NOTE — ED Notes (Signed)
PT reports he fell last . Since then Pt reports his Lt arm starting at elbow down to hand swollen. ROM intact.

## 2014-01-08 NOTE — ED Provider Notes (Signed)
Patient seen/examined in the Emergency Department in conjunction with Midlevel Provider  Patient reports left elbow pain s/p fall Exam : left elbow is swollen over olecranon process Plan: imaging pending. Suspect traumatic effusion    Sharyon Cable, MD 01/08/14 1119

## 2014-01-14 ENCOUNTER — Ambulatory Visit (INDEPENDENT_AMBULATORY_CARE_PROVIDER_SITE_OTHER): Payer: Commercial Managed Care - HMO | Admitting: Internal Medicine

## 2014-01-14 ENCOUNTER — Encounter: Payer: Self-pay | Admitting: Internal Medicine

## 2014-01-14 VITALS — BP 129/78 | HR 65 | Temp 97.0°F | Ht 71.5 in | Wt 189.4 lb

## 2014-01-14 DIAGNOSIS — S59909A Unspecified injury of unspecified elbow, initial encounter: Secondary | ICD-10-CM | POA: Insufficient documentation

## 2014-01-14 DIAGNOSIS — S59902S Unspecified injury of left elbow, sequela: Secondary | ICD-10-CM

## 2014-01-14 DIAGNOSIS — IMO0001 Reserved for inherently not codable concepts without codable children: Secondary | ICD-10-CM

## 2014-01-14 DIAGNOSIS — M109 Gout, unspecified: Secondary | ICD-10-CM

## 2014-01-14 MED ORDER — HYDROCODONE-ACETAMINOPHEN 7.5-325 MG PO TABS: 1.0000 | ORAL_TABLET | Freq: Four times a day (QID) | ORAL | Status: DC | PRN

## 2014-01-14 NOTE — Progress Notes (Signed)
   Subjective:    Patient ID: [name], male    DOB: 03/16/1946, 68 y.o.   MRN: [id number]  HPI Comments: Mr. Fouch is a 68 year old male with PMH of HTN, LBBB, CAD, AAA, hx of lung CA, hip OA and CKD who presents for ED follow-up.  Was seen in ED last week 4 days after mechanical fall onto outstretched arm w/ injury to left elbow.  He does not remember hearing a popping sound but says it happened very quickly.  He initially noted pain and swelling of left elbow.  Xray was negative for fracture but concerning for triceps tendon tear.  He says symptoms have improved and he denies pain or limitation in activity.  Swelling still present but has decreased.  Also, requesting refill of Norco for chronic hip pain.  He takes it about 3 times per day.  He last took one this AM.         Review of Systems  Constitutional: Negative for fever, chills and appetite change.  Respiratory: Negative for shortness of breath.   Cardiovascular: Negative for chest pain.  Gastrointestinal: Negative for nausea, vomiting, abdominal pain, diarrhea, constipation and blood in stool.  Genitourinary: Negative for dysuria, frequency and hematuria.  Neurological: Negative for syncope, weakness and light-headedness.       Objective:   Physical Exam  Vitals reviewed. Constitutional: He is oriented to person, place, and time. He appears well-developed. No distress.  HENT:  Head: Normocephalic and atraumatic.  Mouth/Throat: Oropharynx is clear and moist. No oropharyngeal exudate.  Eyes: EOM are normal. Pupils are equal, round, and reactive to light.  Cardiovascular: Normal rate, regular rhythm and normal heart sounds.  Exam reveals no gallop and no friction rub.   No murmur heard. Pulmonary/Chest: Effort normal and breath sounds normal. No respiratory distress. He has no wheezes. He has no rales.  Abdominal: Soft. Bowel sounds are normal. He exhibits no distension. There is no tenderness.  Musculoskeletal: Normal  range of motion. He exhibits edema. He exhibits no tenderness.  Strength and sensation of upper and lower extremities intact.  Able to flex, extend, supinate and pronate against resistance without pain.  No TTP of the joint.  There is swelling at the olecranon but no pain or tenderness.  Neurological: He is alert and oriented to person, place, and time. No cranial nerve deficit.  Skin: Skin is warm. He is not diaphoretic.  Psychiatric: He has a normal mood and affect. His behavior is normal.          Assessment & Plan:  Please see problem based assessment and plan.

## 2014-01-14 NOTE — Assessment & Plan Note (Addendum)
Although ED xray concerning for triceps rupture, there is no clinical evidence of rupture.  No weakness, tenderness, pain with resisted extension and he has full ROM and feels symptoms are improving making tendon rupture unlikely.  He may have tendinopathy or acute bursitis that is resolving given his improvement in symptoms with time and conservative management. - will refer to orthopedics for evaluation given xray findings though I doubt tear - he will continue ice and prn pain medication (no pain currently) - he was advised to wear sling provided by ED and avoid heavy lifting and strenuous activity with the left arm until he sees orthopedic surgeon - he was advised to call clinic or seek emergency care if symptoms worsen, including arm weakness or increased swelling/pain

## 2014-01-14 NOTE — Patient Instructions (Signed)
1. I am referring you to an Orthopedic surgeon for further management of your elbow swelling.  You can continue ice or heat to the area if it helps.  Use your sling so you remember not to do any strenuous activity with the arm.  If your pain returns you have your chronic pain med that you can take.  If your symptoms return, your arm gets weak, you lose sensation please see emergency care.    2. Please take all medications as prescribed.    3. If you have worsening of your symptoms or new symptoms arise, please call the clinic ([phone]), or go to the ER immediately if symptoms are severe.

## 2014-01-17 NOTE — Progress Notes (Signed)
Case discussed with Dr. Wilson at the time of the visit.  We reviewed the resident's history and exam and pertinent patient test results.  I agree with the assessment, diagnosis, and plan of care documented in the resident's note. 

## 2014-01-26 ENCOUNTER — Encounter (HOSPITAL_COMMUNITY): Payer: Self-pay

## 2014-01-26 ENCOUNTER — Ambulatory Visit (HOSPITAL_COMMUNITY)
Admission: RE | Admit: 2014-01-26 | Discharge: 2014-01-26 | Disposition: A | Discharge status: Home / Self Care | Payer: Medicare PPO | Source: Ambulatory Visit | Attending: Internal Medicine | Admitting: Internal Medicine

## 2014-01-26 ENCOUNTER — Other Ambulatory Visit (HOSPITAL_BASED_OUTPATIENT_CLINIC_OR_DEPARTMENT_OTHER): Payer: Commercial Managed Care - HMO

## 2014-01-26 DIAGNOSIS — Z85118 Personal history of other malignant neoplasm of bronchus and lung: Secondary | ICD-10-CM | POA: Diagnosis not present

## 2014-01-26 DIAGNOSIS — R0602 Shortness of breath: Secondary | ICD-10-CM | POA: Insufficient documentation

## 2014-01-26 DIAGNOSIS — C3490 Malignant neoplasm of unspecified part of unspecified bronchus or lung: Secondary | ICD-10-CM

## 2014-01-26 DIAGNOSIS — C3410 Malignant neoplasm of upper lobe, unspecified bronchus or lung: Secondary | ICD-10-CM

## 2014-01-26 LAB — COMPREHENSIVE METABOLIC PANEL (CC13)
ALK PHOS: 98 U/L (ref 40–150)
ALT: 20 U/L (ref 0–55)
AST: 37 U/L — AB (ref 5–34)
Albumin: 3.6 g/dL (ref 3.5–5.0)
Anion Gap: 16 mEq/L — ABNORMAL HIGH (ref 3–11)
BILIRUBIN TOTAL: 0.83 mg/dL (ref 0.20–1.20)
BUN: 12.3 mg/dL (ref 7.0–26.0)
CO2: 26 mEq/L (ref 22–29)
Calcium: 9.5 mg/dL (ref 8.4–10.4)
Chloride: 101 mEq/L (ref 98–109)
Creatinine: 1.2 mg/dL (ref 0.7–1.3)
Glucose: 103 mg/dl (ref 70–140)
Potassium: 3.8 mEq/L (ref 3.5–5.1)
SODIUM: 142 meq/L (ref 136–145)
TOTAL PROTEIN: 7.5 g/dL (ref 6.4–8.3)

## 2014-01-26 LAB — CBC WITH DIFFERENTIAL/PLATELET
BASO%: 1.3 % (ref 0.0–2.0)
Basophils Absolute: 0.1 10*3/uL (ref 0.0–0.1)
EOS ABS: 0.1 10*3/uL (ref 0.0–0.5)
EOS%: 2.7 % (ref 0.0–7.0)
HCT: 42.7 % (ref 38.4–49.9)
HGB: 13.7 g/dL (ref 13.0–17.1)
LYMPH%: 27.4 % (ref 14.0–49.0)
MCH: 30.3 pg (ref 27.2–33.4)
MCHC: 32.1 g/dL (ref 32.0–36.0)
MCV: 94.7 fL (ref 79.3–98.0)
MONO#: 0.4 10*3/uL (ref 0.1–0.9)
MONO%: 7.8 % (ref 0.0–14.0)
NEUT#: 3.1 10*3/uL (ref 1.5–6.5)
NEUT%: 60.8 % (ref 39.0–75.0)
PLATELETS: 163 10*3/uL (ref 140–400)
RBC: 4.51 10*6/uL (ref 4.20–5.82)
RDW: 15.5 % — ABNORMAL HIGH (ref 11.0–14.6)
WBC: 5.1 10*3/uL (ref 4.0–10.3)
lymph#: 1.4 10*3/uL (ref 0.9–3.3)

## 2014-01-27 ENCOUNTER — Ambulatory Visit (HOSPITAL_BASED_OUTPATIENT_CLINIC_OR_DEPARTMENT_OTHER): Payer: Commercial Managed Care - HMO | Admitting: Internal Medicine

## 2014-01-27 ENCOUNTER — Telehealth: Payer: Self-pay | Admitting: Internal Medicine

## 2014-01-27 VITALS — BP 139/81 | HR 67 | Temp 98.2°F | Resp 18 | Ht 71.0 in | Wt 189.0 lb

## 2014-01-27 DIAGNOSIS — M109 Gout, unspecified: Secondary | ICD-10-CM

## 2014-01-27 DIAGNOSIS — C3410 Malignant neoplasm of upper lobe, unspecified bronchus or lung: Secondary | ICD-10-CM

## 2014-01-27 DIAGNOSIS — C3490 Malignant neoplasm of unspecified part of unspecified bronchus or lung: Secondary | ICD-10-CM

## 2014-01-27 NOTE — Progress Notes (Signed)
[facility] [phone]   [phone]  OFFICE PROGRESS NOTE  Madilyn Fireman, MD [address] [address]  DIAGNOSIS: Stage IB (T2, N0, M0) non-small cell lung cancer diagnosed in November of 2007   PRIOR THERAPY: Status post right upper lobectomy with mediastinal lymph node dissection under the care of Dr. Roxan Hockey on 04/28/2006. The tumor size was 8 CM but the patient refused adjuvant chemotherapy at that time.   CURRENT THERAPY: Observation.  INTERVAL HISTORY: [name] 68 y.o. male returns to the clinic today for annual followup visit. He has been observation for the last 8 years and doing well. The patient is feeling fine today with no specific complaints. He denied having any significant chest pain,shortness of breath, cough or hemoptysis. He has no significant weight loss or night sweats. The patient had repeat CT scan of the chest performed recently and he is here for evaluation and discussion of his scan results.  MEDICAL HISTORY: Past Medical History  Diagnosis Date  . Diverticulosis of colon   . GERD (gastroesophageal reflux disease)   . Gout   . Hypertension   . Hyperlipidemia   . Renal failure, acute     hx of 2/2 medication, resolved  . Barretts esophagus     bx distal esophagus 2011 neg   . Tobacco abuse   . Alcohol abuse   . Hx of adenomatous colonic polyps   . CHF (congestive heart failure)   . Hiatal hernia     2 cm noted on upper endos 2011   . Cataract   . Internal hemorrhoid   . DJD (degenerative joint disease)     left hip  . Myocardial infarction     hx of, 2 stents 2004. pt was on Plavix x 3 years then transitioned to ASA  . 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 Cataract   . Cholelithiasis   . UTI (lower urinary tract infection)   . AAA (abdominal aortic aneurysm)     noted CT 11/2012 size 3.3 x 3.2 cm    ALLERGIES:  is  allergic to candesartan cilexetil-hctz; hydrochlorothiazide; and shellfish allergy.  MEDICATIONS:  Current Outpatient Prescriptions  Medication Sig Dispense Refill  . allopurinol (ZYLOPRIM) 100 MG tablet TAKE 1 TABLET (100 MG TOTAL) BY MOUTH DAILY.  30 tablet  5  . amLODipine (NORVASC) 5 MG tablet TAKE ONE TABLET BY MOUTH ONCE DAILY  30 tablet  5  . aspirin EC 81 MG tablet Take 81 mg by mouth daily.      . colchicine (COLCRYS) 0.6 MG tablet Take 1 tablet twice daily as needed.  30 tablet  0  . furosemide (LASIX) 20 MG tablet TAKE 1 TABLET (20 MG TOTAL) BY MOUTH DAILY.  30 tablet  1  . HYDROcodone-acetaminophen (NORCO) 7.5-325 MG per tablet Take 1 tablet by mouth every 6 (six) hours as needed for moderate pain.  120 tablet  0  . metoprolol (LOPRESSOR) 50 MG tablet TAKE 1/2 TABLET BY MOUTH TWICE DAILY  31 tablet  5  . omeprazole (PRILOSEC) 20 MG capsule TAKE 1 CAPSULE (20 MG TOTAL) BY MOUTH 2 (TWO) TIMES DAILY.  60 capsule  5  . rosuvastatin (CRESTOR) 20 MG tablet Take 1 tablet (20 mg total) by mouth daily.  30 tablet  5   No current facility-administered medications for this visit.    SURGICAL HISTORY:  Past Surgical History  Procedure Laterality Date  . Lung lobectomy  11/07    RUL  . Flexible cystourethroscopy  05/24/06  . Other surgical history      hernia repair 2005   . Hernia repair    . Colonoscopy    . Upper gastrointestinal endoscopy      REVIEW OF SYSTEMS:  A comprehensive review of systems was negative.   PHYSICAL EXAMINATION: General appearance: alert, cooperative and no distress Head: Normocephalic, without obvious abnormality, atraumatic Neck: no adenopathy Lymph nodes: Cervical, supraclavicular, and axillary nodes normal. Resp: clear to auscultation bilaterally Cardio: regular rate and rhythm, S1, S2 normal, no murmur, click, rub or gallop GI: soft, non-tender; bowel sounds normal; no masses,  no organomegaly Extremities: extremities normal, atraumatic, no  cyanosis or edema  ECOG PERFORMANCE STATUS: 1 - Symptomatic but completely ambulatory  Blood pressure 139/81, pulse 67, temperature 98.2 F (36.8 C), temperature source Oral, resp. rate 18, height 5\' 11"  (1.803 m), weight 189 lb (85.73 kg).  LABORATORY DATA: Lab Results  Component Value Date   WBC 5.1 01/26/2014   HGB 13.7 01/26/2014   HCT 42.7 01/26/2014   MCV 94.7 01/26/2014   PLT 163 01/26/2014      Chemistry      Component Value Date/Time   NA 142 01/26/2014 0806   NA 142 11/09/2013 0920   NA 143 01/25/2011 0901   K 3.8 01/26/2014 0806   K 4.5 11/09/2013 0920   K 4.2 01/25/2011 0901   CL 106 11/09/2013 0920   CL 101 01/25/2011 0901   CO2 26 01/26/2014 0806   CO2 28 11/09/2013 0920   CO2 26 01/25/2011 0901   BUN 12.3 01/26/2014 0806   BUN 17 11/09/2013 0920   BUN 15 01/25/2011 0901   CREATININE 1.2 01/26/2014 0806   CREATININE 1.24 11/09/2013 0920   CREATININE 1.31 01/22/2013 0800      Component Value Date/Time   CALCIUM 9.5 01/26/2014 0806   CALCIUM 9.1 11/09/2013 0920   CALCIUM 9.1 01/25/2011 0901   ALKPHOS 98 01/26/2014 0806   ALKPHOS 88 01/22/2013 0800   ALKPHOS 93* 01/25/2011 0901   AST 37* 01/26/2014 0806   AST 44* 01/22/2013 0800   AST 35 01/25/2011 0901   ALT 20 01/26/2014 0806   ALT 34 01/22/2013 0800   ALT 21 01/25/2011 0901   BILITOT 0.83 01/26/2014 0806   BILITOT 0.4 01/22/2013 0800   BILITOT 0.60 01/25/2011 0901       RADIOGRAPHIC STUDIES: Dg Elbow Complete Left  01-10-14   CLINICAL DATA:  [place].  Persistent elbow pain.  EXAM: LEFT ELBOW - COMPLETE 3+ VIEW  COMPARISON:  None.  FINDINGS: The joint spaces are maintained. No acute fracture is identified. No joint effusion. There is significant soft tissue swelling posteriorly along the distal humerus and around the olecranon. Small calcifications are noted approximately 2-1/2 cm from a olecranon. These could be dystrophic calcifications but findings are somewhat suspicious for a triceps tendon tear. Recommend correlation with clinical  findings. MRI may be helpful for further evaluation if clinically indicated.  IMPRESSION: No acute fracture.  Mild elbow joint degenerative changes but no joint effusion.  Significant posterior soft tissue swelling involving the distal arm and elbow with small calcifications noted in the soft tissues. Findings suspicious for a triceps tendon tear. Recommend correlation with clinical findings. MRI may be helpful for further evaluation.   Electronically Signed   By: Kalman Jewels M.D.   On: 01/10/14 12:09  Ct Chest Wo Contrast  01/26/2014   CLINICAL DATA:  Shortness of breath. History of lung cancer in 2007.  EXAM: CT CHEST WITHOUT CONTRAST  TECHNIQUE: Multidetector CT imaging of the chest was performed following the standard protocol without IV contrast.  COMPARISON:  Chest CT 01/22/2013.  FINDINGS: The chest wall is unremarkable and stable. No chest wall masses, supraclavicular or axillary lymphadenopathy. The bony thorax is stable. There are postoperative changes involving the right ribs from a prior thoracotomy. Pleural calcifications are also noted. No acute bony findings or destructive bony changes. The thoracic vertebral bodies are intact.  The heart is normal in size. No pericardial effusion. Small scattered mediastinal and hilar lymph nodes but no mass or adenopathy. The esophagus is grossly normal. Stable aortic and branch vessel calcifications. Dense 3 vessel coronary artery calcifications are again noted.  Examination of the lung parenchyma demonstrates stable surgical changes on the right side. No findings for recurrent tumor or metastatic pulmonary disease. Emphysematous changes are again noted. No infiltrates or effusions. No bronchiectasis or interstitial lung disease.  PE upper abdomen is unremarkable. Stable right renal cysts and hemorrhagic/hyperdense left upper pole renal cyst. Geographic fatty infiltration of the liver is demonstrated. Gallstones noted in the gallbladder.  IMPRESSION: 1.  Stable postoperative changes involving the right hemi thorax. No findings for recurrent tumor, mediastinal/hilar adenopathy or metastatic pulmonary disease. 2. Stable emphysematous changes. 3. Stable atherosclerotic calcifications involving the aorta and branch vessels. 4. Stable right renal cyst and hemorrhagic left renal cyst. Stable gallstones.   Electronically Signed   By: Kalman Jewels M.D.   On: 01/26/2014 08:54   ASSESSMENT AND PLAN: This is a very pleasant 68 years old [place] American male with history of stage IB non-small cell lung cancer status post right upper lobectomy with mediastinal lymph node dissection and has been observation for the last 8 years with no significant evidence for disease recurrence. I discussed the scan results with the patient today.  I recommended for him to continue on observation with repeat CT scan of the chest without contrast in one year. He was advised to call immediately she has any concerning symptoms in the interval.  The patient voices understanding of current disease status and treatment options and is in agreement with the current care plan.  All questions were answered. The patient knows to call the clinic with any problems, questions or concerns. We can certainly see the patient much sooner if necessary.  Disclaimer: This note was dictated with voice recognition software. Similar sounding words can inadvertently be transcribed and may be missed upon review.

## 2014-01-27 NOTE — Telephone Encounter (Signed)
Pt confirmed labs/ov per 08/13, gave pt AVS....KJ

## 2014-02-05 ENCOUNTER — Other Ambulatory Visit: Payer: Self-pay | Admitting: Internal Medicine

## 2014-02-11 ENCOUNTER — Other Ambulatory Visit: Payer: Self-pay | Admitting: *Deleted

## 2014-02-11 DIAGNOSIS — M109 Gout, unspecified: Secondary | ICD-10-CM

## 2014-02-11 NOTE — Telephone Encounter (Signed)
Last refill 7/31 Call when ready @ [phone]

## 2014-02-15 MED ORDER — HYDROCODONE-ACETAMINOPHEN 7.5-325 MG PO TABS: 1.0000 | ORAL_TABLET | Freq: Four times a day (QID) | ORAL | Status: DC | PRN

## 2014-02-19 ENCOUNTER — Other Ambulatory Visit: Payer: Self-pay | Admitting: Internal Medicine

## 2014-03-15 ENCOUNTER — Other Ambulatory Visit: Payer: Self-pay | Admitting: *Deleted

## 2014-03-15 DIAGNOSIS — M109 Gout, unspecified: Secondary | ICD-10-CM

## 2014-03-15 MED ORDER — HYDROCODONE-ACETAMINOPHEN 7.5-325 MG PO TABS: 1.0000 | ORAL_TABLET | Freq: Four times a day (QID) | ORAL | Status: DC | PRN

## 2014-03-15 NOTE — Telephone Encounter (Signed)
Last filled 9/2 Call when ready @ [phone]

## 2014-03-15 NOTE — Telephone Encounter (Signed)
Pt informed Rx is ready 

## 2014-04-02 ENCOUNTER — Other Ambulatory Visit: Payer: Self-pay | Admitting: Internal Medicine

## 2014-04-13 ENCOUNTER — Other Ambulatory Visit: Payer: Self-pay | Admitting: Internal Medicine

## 2014-04-15 ENCOUNTER — Other Ambulatory Visit: Payer: Self-pay | Admitting: *Deleted

## 2014-04-15 DIAGNOSIS — M25552 Pain in left hip: Principal | ICD-10-CM

## 2014-04-15 DIAGNOSIS — M25551 Pain in right hip: Secondary | ICD-10-CM

## 2014-04-15 MED ORDER — HYDROCODONE-ACETAMINOPHEN 7.5-325 MG PO TABS: 1.0000 | ORAL_TABLET | Freq: Four times a day (QID) | ORAL | Status: DC | PRN

## 2014-04-15 NOTE — Telephone Encounter (Signed)
Pt informed Rx is ready 

## 2014-04-15 NOTE — Telephone Encounter (Signed)
Last refill 10/1 Pt # [phone]

## 2014-05-09 ENCOUNTER — Other Ambulatory Visit: Payer: Self-pay | Admitting: *Deleted

## 2014-05-09 MED ORDER — AMLODIPINE BESYLATE 5 MG PO TABS: 5.0000 mg | ORAL_TABLET | Freq: Every day | ORAL | Status: DC

## 2014-05-09 MED ORDER — ROSUVASTATIN CALCIUM 20 MG PO TABS: 20.0000 mg | ORAL_TABLET | Freq: Every day | ORAL | Status: DC

## 2014-05-09 MED ORDER — FUROSEMIDE 20 MG PO TABS: ORAL_TABLET | ORAL | Status: DC

## 2014-05-09 MED ORDER — ALLOPURINOL 100 MG PO TABS: 100.0000 mg | ORAL_TABLET | Freq: Every day | ORAL | Status: DC

## 2014-05-09 MED ORDER — METOPROLOL TARTRATE 25 MG PO TABS: 25.0000 mg | ORAL_TABLET | Freq: Two times a day (BID) | ORAL | Status: DC

## 2014-05-09 MED ORDER — OMEPRAZOLE 20 MG PO CPDR: DELAYED_RELEASE_CAPSULE | ORAL | Status: DC

## 2014-05-09 NOTE — Telephone Encounter (Signed)
Pt is changing pharmacy, need 90 day supply for mail order.

## 2014-05-10 ENCOUNTER — Ambulatory Visit (INDEPENDENT_AMBULATORY_CARE_PROVIDER_SITE_OTHER): Payer: Commercial Managed Care - HMO | Admitting: Internal Medicine

## 2014-05-10 ENCOUNTER — Ambulatory Visit (INDEPENDENT_AMBULATORY_CARE_PROVIDER_SITE_OTHER): Payer: Commercial Managed Care - HMO | Admitting: *Deleted

## 2014-05-10 ENCOUNTER — Encounter: Payer: Self-pay | Admitting: Internal Medicine

## 2014-05-10 VITALS — BP 132/81 | HR 73 | Temp 97.7°F | Resp 20 | Ht 71.5 in | Wt 198.7 lb

## 2014-05-10 DIAGNOSIS — F17200 Nicotine dependence, unspecified, uncomplicated: Secondary | ICD-10-CM

## 2014-05-10 DIAGNOSIS — Z23 Encounter for immunization: Secondary | ICD-10-CM

## 2014-05-10 DIAGNOSIS — I10 Essential (primary) hypertension: Secondary | ICD-10-CM

## 2014-05-10 DIAGNOSIS — S59902S Unspecified injury of left elbow, sequela: Secondary | ICD-10-CM

## 2014-05-10 DIAGNOSIS — C3490 Malignant neoplasm of unspecified part of unspecified bronchus or lung: Secondary | ICD-10-CM

## 2014-05-10 DIAGNOSIS — E785 Hyperlipidemia, unspecified: Secondary | ICD-10-CM

## 2014-05-10 DIAGNOSIS — L84 Corns and callosities: Secondary | ICD-10-CM

## 2014-05-10 DIAGNOSIS — H269 Unspecified cataract: Secondary | ICD-10-CM

## 2014-05-10 DIAGNOSIS — M1A0610 Idiopathic chronic gout, right knee, without tophus (tophi): Secondary | ICD-10-CM

## 2014-05-10 DIAGNOSIS — M25552 Pain in left hip: Secondary | ICD-10-CM

## 2014-05-10 DIAGNOSIS — M25551 Pain in right hip: Secondary | ICD-10-CM

## 2014-05-10 DIAGNOSIS — Z0000 Encounter for general adult medical examination without abnormal findings: Secondary | ICD-10-CM

## 2014-05-10 DIAGNOSIS — Z720 Tobacco use: Secondary | ICD-10-CM

## 2014-05-10 LAB — LIPID PANEL
CHOL/HDL RATIO: 2.3 ratio
Cholesterol: 135 mg/dL (ref 0–200)
HDL: 59 mg/dL (ref >39)
LDL Cholesterol: 68 mg/dL (ref 0–99)
Triglycerides: 42 mg/dL (ref <150)
VLDL: 8 mg/dL (ref 0–40)

## 2014-05-10 LAB — URIC ACID: URIC ACID, SERUM: 5.7 mg/dL (ref 4.0–7.8)

## 2014-05-10 MED ORDER — HYDROCODONE-ACETAMINOPHEN 7.5-325 MG PO TABS: 1.0000 | ORAL_TABLET | Freq: Four times a day (QID) | ORAL | Status: DC | PRN

## 2014-05-10 NOTE — Assessment & Plan Note (Signed)
Seems to be healing Referral to podiatry

## 2014-05-10 NOTE — Assessment & Plan Note (Signed)
No motivation to leave smoking. Denies.

## 2014-05-10 NOTE — Progress Notes (Signed)
   Subjective:    Patient ID: [name], male    DOB: 26-Feb-1946, 68 y.o.   MRN: [id number]  HPI [name] is my patient who I am meeting for the first time. He has seen other providers in clinic prior to this. This is a routine visit. Today we discussed the following issues:   Chronic issue - Hypertension: Reports compliance with medications. Denies any headaches, chest pain, dizziness or palpitations.  Chronic issue - Gout: Last attack 7 months ago. Takes Allopurinol daily. Takes Colchicine for acute attack.  Health prevention - Smoking: Does not intend to leave it despite having survived lung cancer. Smokes half a pack a day.  Last visit follow up - Left elbow injury: Improved, no pain any more.  New complaint - Right 2nd toe callus: He applied over the counter callus/corn adhesive bandaid on it, and it seems to have improved. It does not hurt.  Leading question about visual disturbance: He answered that he has been living with deteriorating vision for months, and his old eye doctor [name] is retired now. He would like to go to a new eye doctor.   Review of Systems  Constitutional: Negative for fever and chills.  Eyes: Positive for visual disturbance.  Respiratory: Negative for chest tightness and shortness of breath.   Cardiovascular: Negative for chest pain and palpitations.  Gastrointestinal: Negative for vomiting, diarrhea and constipation.  Genitourinary: Negative for dysuria.  Musculoskeletal: Negative for joint swelling and arthralgias.  Skin: Negative for rash.  Neurological: Negative for dizziness.       Objective:   Physical Exam  Constitutional: He is oriented to person, place, and time. He appears well-developed and well-nourished.  HENT:  Head: Normocephalic and atraumatic.  Eyes: Conjunctivae and EOM are normal. Pupils are equal, round, and reactive to light. Right eye exhibits no discharge. Left eye exhibits no discharge. No scleral icterus.  Bilateral  cataracts, injected sclerae bilaterally  Neck: Normal range of motion.  Cardiovascular: Normal rate, regular rhythm and normal heart sounds.   No murmur heard. Pulmonary/Chest: Effort normal and breath sounds normal. He has no wheezes. He has no rales.  Abdominal: Soft. Bowel sounds are normal. There is no tenderness.  Musculoskeletal:  Right foot - callus on the medial aspect pf second toe - about 21mm diameter, non tender, skin intact, no erythema, white discoloration from the adhesive tape that was on.  Neurological: He is alert and oriented to person, place, and time.  Skin: Skin is warm.      Assessment & Plan:   Please see problem based charting.

## 2014-05-10 NOTE — Assessment & Plan Note (Signed)
No recurrence. Follows with Dr Earlie Server

## 2014-05-10 NOTE — Assessment & Plan Note (Signed)
Referral to ophthalmology. Dr Katy [facility]itch [facility] preferred by patient.

## 2014-05-10 NOTE — Assessment & Plan Note (Signed)
BP Readings from Last 3 Encounters:  05/10/14 132/81  01/27/14 139/81  01/14/14 129/78   Well controlled. No changes.

## 2014-05-10 NOTE — Assessment & Plan Note (Signed)
Health Maintenance Due  Topic Date Due  . INFLUENZA VACCINE  01/15/2014    Uptodate.

## 2014-05-10 NOTE — Patient Instructions (Signed)
Mr Kutner,   Very nice meeting you today.  We will make podiatry (foot doctor) and ophthalmology (eye) referrals for you. Please keep taking your medications regularly.  We will do Uric Acid and Lipid levels today.  If the results are abnormal, I will call you.  Thanks, Madilyn Fireman MD MPH 05/10/2014 9:07 AM

## 2014-05-10 NOTE — Assessment & Plan Note (Signed)
In remission  Results for RIAD, WAGLEY (MRN [id number]) as of 05/10/2014 15:06  08/06/2012 14:10 11/09/2013 09:20  Uric Acid, Serum 9.2 (H) 5.5    Will continue current treatment  Will do uric acid levels today.

## 2014-05-24 ENCOUNTER — Ambulatory Visit (INDEPENDENT_AMBULATORY_CARE_PROVIDER_SITE_OTHER): Payer: Commercial Managed Care - HMO | Admitting: Podiatry

## 2014-05-24 ENCOUNTER — Ambulatory Visit (INDEPENDENT_AMBULATORY_CARE_PROVIDER_SITE_OTHER): Payer: Commercial Managed Care - HMO

## 2014-05-24 VITALS — BP 173/88 | HR 89 | Resp 16

## 2014-05-24 DIAGNOSIS — M2011 Hallux valgus (acquired), right foot: Secondary | ICD-10-CM

## 2014-05-24 DIAGNOSIS — M79674 Pain in right toe(s): Secondary | ICD-10-CM | POA: Diagnosis not present

## 2014-05-24 DIAGNOSIS — M7751 Other enthesopathy of right foot: Secondary | ICD-10-CM

## 2014-05-24 NOTE — Progress Notes (Signed)
   Subjective:    Patient ID: [name], male    DOB: 03-02-1946, 68 y.o.   MRN: [id number]  HPI  Pt presents with right foot pain from bunion deformity. Great toe deviates toward 2nd met causing friction and pain to 2nd met.   Review of Systems  All other systems reviewed and are negative.      Objective:   Physical Exam: I have reviewed his past medical history medications allergies surgery social history and review of systems. Pulses are weak but palpable bilateral. Neurologic sensorium is intact versus lasting monofilament. Deep tendon reflexes are intact bilateral muscle strength +5 over 5 dorsiflexion plantar flexors and inverters everters onto the musculature is intact. Orthopedic evaluation demonstrates all joints distal to the ankle have full range of motion without crepitation. Cutaneous evaluation demonstrates supple well-hydrated cutis with superficial reactive hyperkeratotic medial aspect of the PIPJ second digit of the right foot. Hallux abductovalgus deformity with pes planus is noted right greater than left the hallux valgus deformity is resulting in the reactive hyperkeratosis to the medial aspect of the IPJ second digit right however he has tenderness on palpation of the joint and range of motion of the joint.        Assessment & Plan:  Assessment: Capsulitis second PIPJ right foot. Hallux valgus right foot. Pes planus.  Plan: Debridement of reactive hyperkeratosis after an injection of the joint with 2 mg of dexamethasone and local anesthetic. I dispensed multiple types of spacer devices to keep the toes separated. I did discuss with him in great detail today the need for surgical intervention and the lack thereof may result in ulceration. I will follow-up with him on an as-needed basis.

## 2014-06-14 ENCOUNTER — Other Ambulatory Visit: Payer: Self-pay | Admitting: *Deleted

## 2014-06-14 DIAGNOSIS — M25552 Pain in left hip: Principal | ICD-10-CM

## 2014-06-14 DIAGNOSIS — M25551 Pain in right hip: Secondary | ICD-10-CM

## 2014-06-14 MED ORDER — HYDROCODONE-ACETAMINOPHEN 7.5-325 MG PO TABS: 1.0000 | ORAL_TABLET | Freq: Four times a day (QID) | ORAL | Status: DC | PRN

## 2014-06-14 NOTE — Telephone Encounter (Signed)
Call from pt requesting refill.Despina Hidden Cassady12/29/201510:53 AM

## 2014-06-14 NOTE — Telephone Encounter (Signed)
Pt informed rx ready for pickup  Despina Hidden Cassady12/29/201512:57 PM

## 2014-06-18 IMAGING — CT CT CHEST W/O CM
2 of 4 series · 15 of 36 positions shown, 18 images · non-contrast
Comparison: 01/25/2011 and CT chest abdomen pelvis 01/29/2010.

CLINICAL DATA: Lung cancer.  Shortness of breath.

CT CHEST WITHOUT CONTRAST
TECHNIQUE: Multidetector CT imaging of the chest was performed
following the standard protocol without IV contrast.

[Series 2: chest w/o st · axial · non-contrast · 0.84mm/px · z∈[-386,-106]mm · 12 of 68 slices shown, 15 images]
[im 6/68  mediastinal]
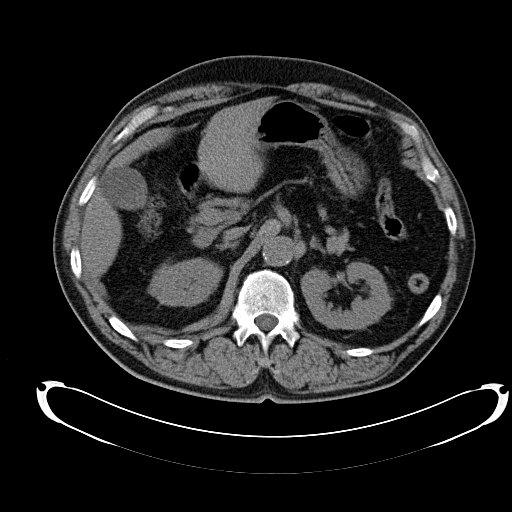
[im 6/68  lung]
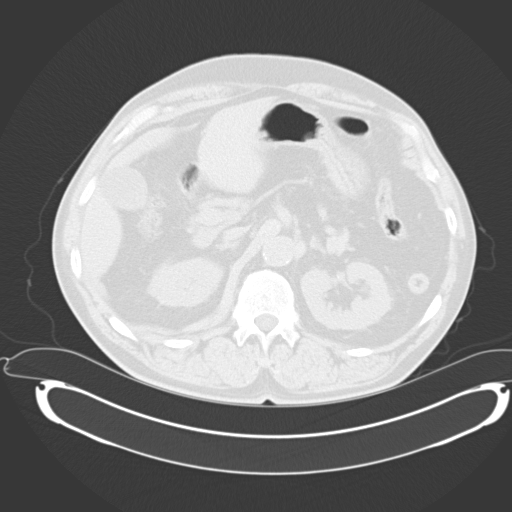
[im 11/68  lung]
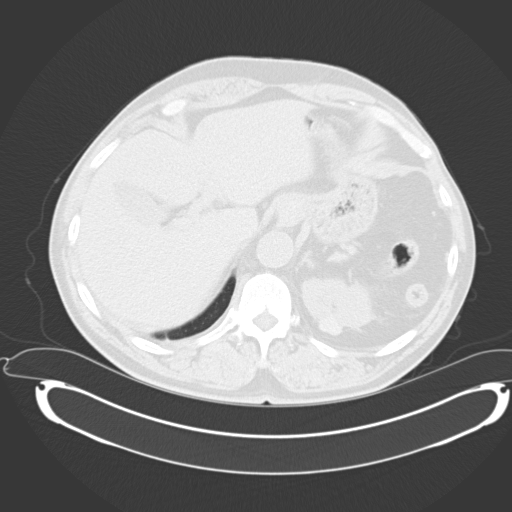
[im 16/68  lung]
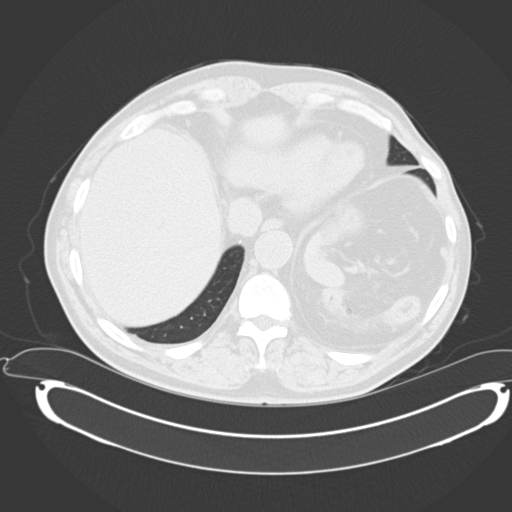
[im 21/68  lung]
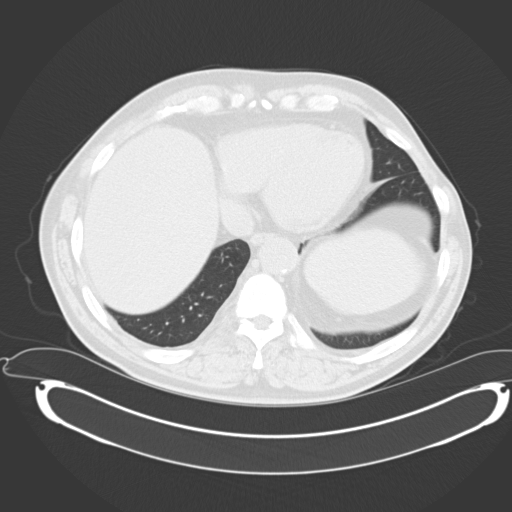
[im 26/68  mediastinal]
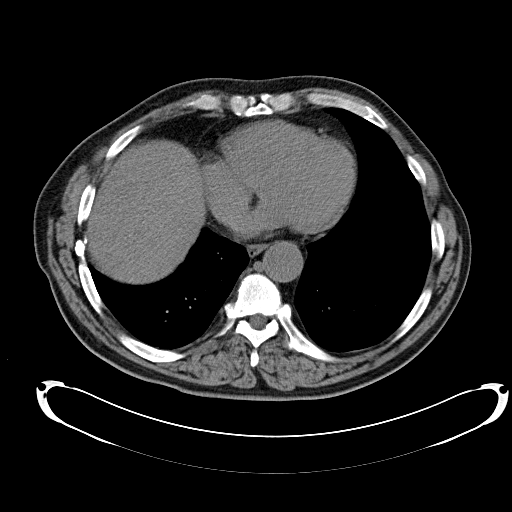
[im 26/68  lung]
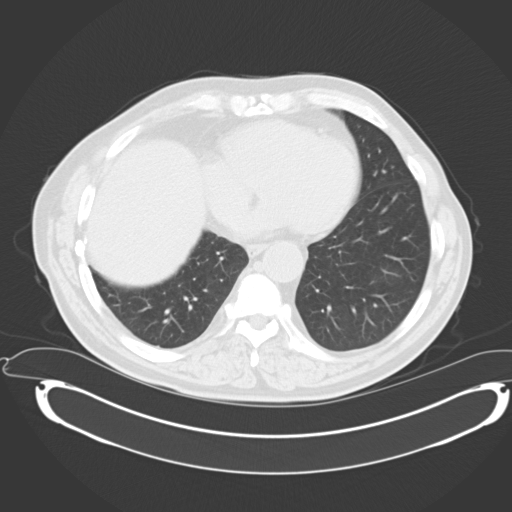
[im 31/68  lung]
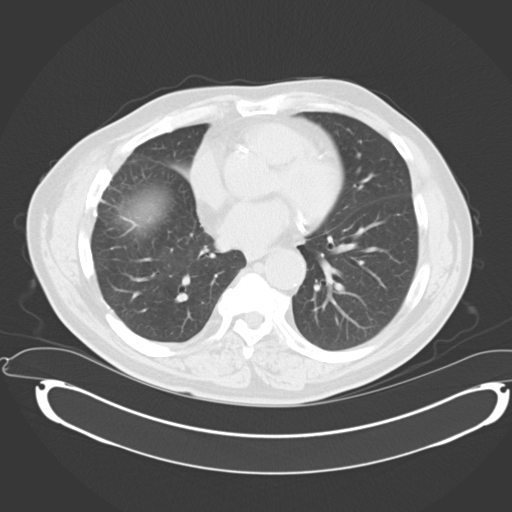
[im 37/68  lung]
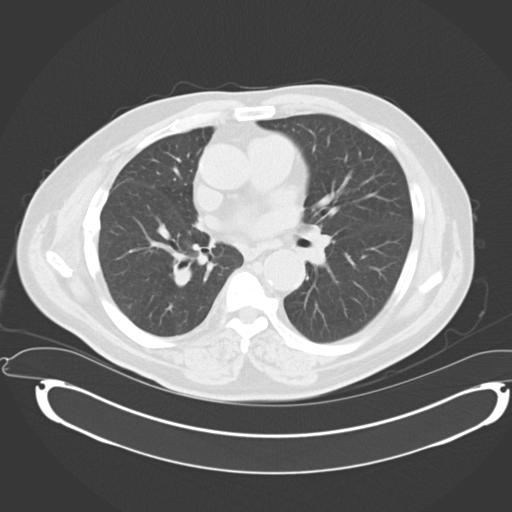
[im 42/68  lung]
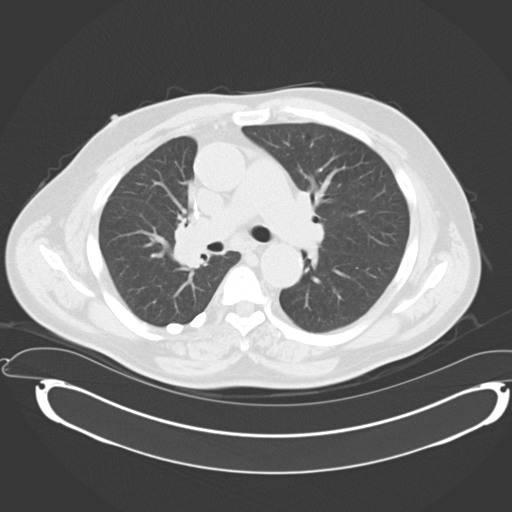
[im 47/68  mediastinal]
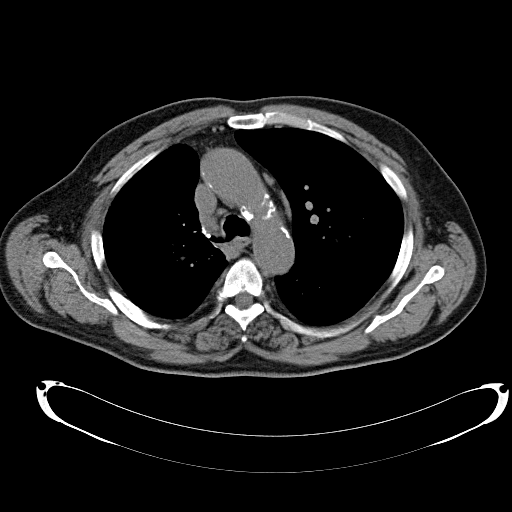
[im 47/68  lung]
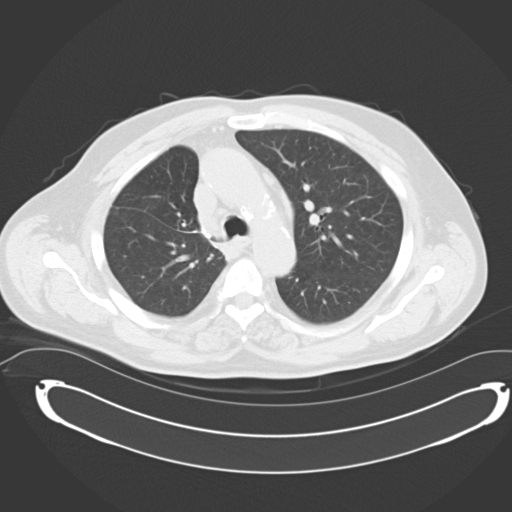
[im 52/68  lung]
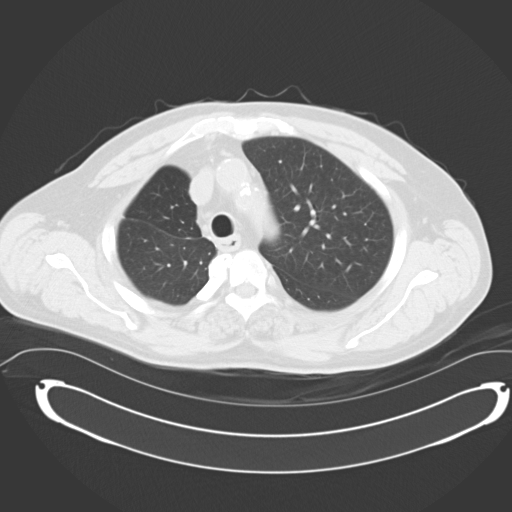
[im 57/68  lung]
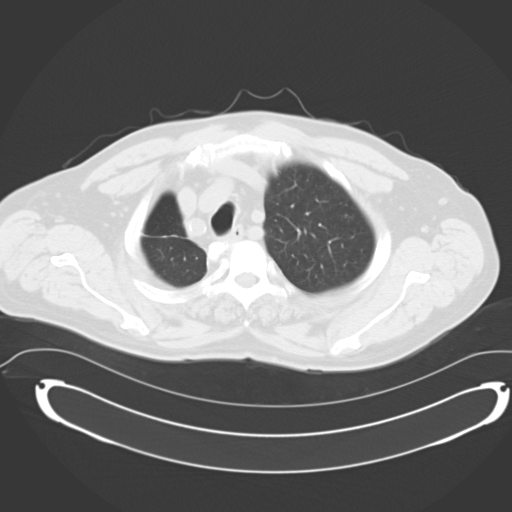
[im 62/68  lung]
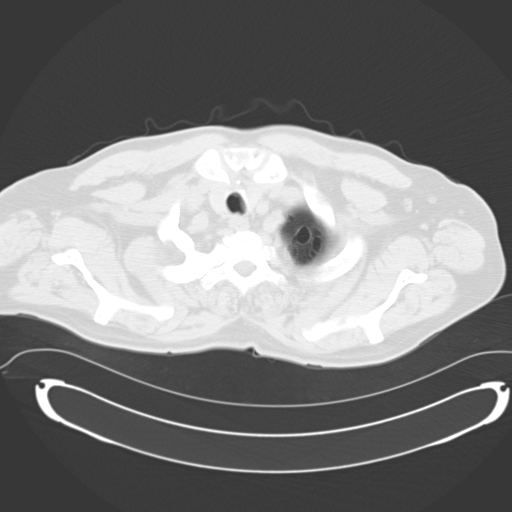

[Series 602: coronal images · coronal · 0.84mm/px · 3 of 87 slices shown]
[im 18/87  lung]
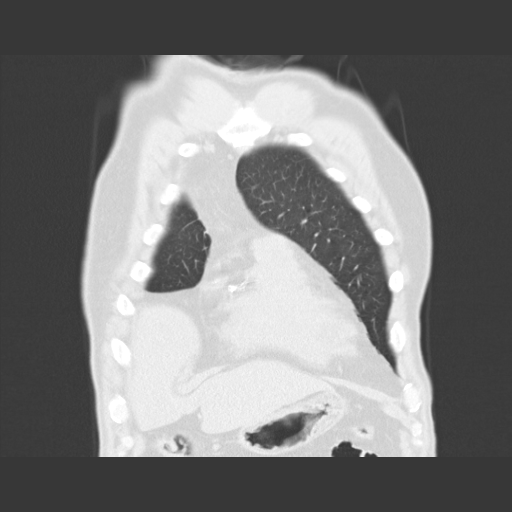
[im 35/87  lung]
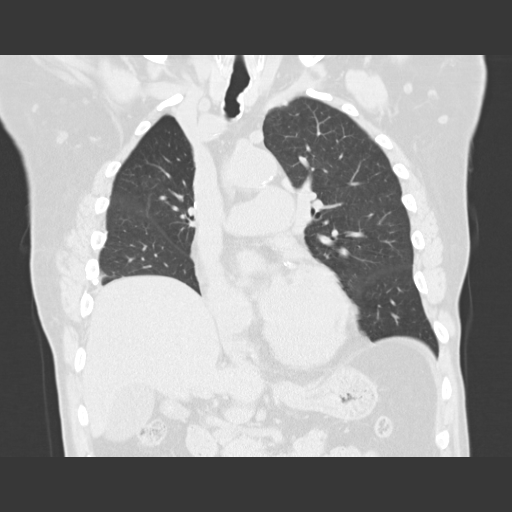
[im 52/87  lung]
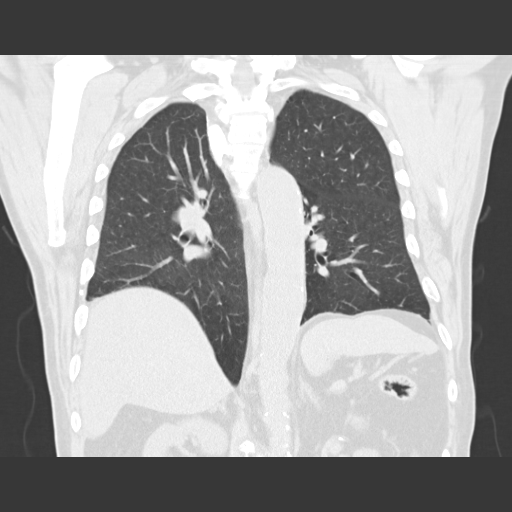

[15 of 36 positions shown; findings below may reference images not displayed]

FINDINGS: No pathologically enlarged mediastinal or axillary lymph
nodes.  Hilar regions are difficult to definitively evaluate
without IV contrast.  Atherosclerotic calcification of the arterial
vasculature, including coronary arteries. Ascending aorta measures
4.2 cm.  Heart size normal.  No pericardial effusion.  Pulmonary
arteries are enlarged.

Postoperative changes of right upper lobectomy, stable. Mild
centrilobular emphysema.  Lungs are otherwise clear.  No pleural
fluid. Mild narrowing of the right middle lobe bronchus, stable.
Airway is otherwise unremarkable.

Incidental imaging of the upper abdomen shows cholelithiasis.
There are high and low attenuation lesions in the kidneys.  Low
attenuation lesions measure up to 2.2 cm on the right.  High
attenuation lesion measures 4.9 cm on the left.  Sizes appear
stable from prior exams. No worrisome lytic or sclerotic lesions.
Thoracotomy changes on the right.  Degenerative changes in the
spine.  Osseous bridging is seen between the right first and second
ribs.
IMPRESSION: 1.  Postoperative changes of right upper lobectomy without evidence
of recurrent or metastatic disease.
2.  Small ascending aortic aneurysm, pulmonary arterial
hypertension and coronary artery calcification.
3.  Cholelithiasis.

## 2014-07-22 ENCOUNTER — Other Ambulatory Visit: Payer: Self-pay | Admitting: *Deleted

## 2014-07-22 DIAGNOSIS — M25552 Pain in left hip: Principal | ICD-10-CM

## 2014-07-22 DIAGNOSIS — M25551 Pain in right hip: Secondary | ICD-10-CM

## 2014-07-22 MED ORDER — HYDROCODONE-ACETAMINOPHEN 7.5-325 MG PO TABS: 1.0000 | ORAL_TABLET | Freq: Four times a day (QID) | ORAL | Status: DC | PRN

## 2014-07-22 NOTE — Telephone Encounter (Signed)
Last refill 12/30 Pt # [phone]

## 2014-07-26 ENCOUNTER — Other Ambulatory Visit: Payer: Self-pay | Admitting: Internal Medicine

## 2014-08-16 ENCOUNTER — Encounter: Payer: Self-pay | Admitting: Internal Medicine

## 2014-08-16 ENCOUNTER — Ambulatory Visit (INDEPENDENT_AMBULATORY_CARE_PROVIDER_SITE_OTHER): Payer: Commercial Managed Care - HMO | Admitting: Internal Medicine

## 2014-08-16 VITALS — BP 142/83 | HR 66 | Temp 97.7°F | Wt 208.3 lb

## 2014-08-16 DIAGNOSIS — I714 Abdominal aortic aneurysm, without rupture, unspecified: Secondary | ICD-10-CM

## 2014-08-16 DIAGNOSIS — H269 Unspecified cataract: Secondary | ICD-10-CM

## 2014-08-16 DIAGNOSIS — M25551 Pain in right hip: Secondary | ICD-10-CM

## 2014-08-16 DIAGNOSIS — F17200 Nicotine dependence, unspecified, uncomplicated: Secondary | ICD-10-CM | POA: Diagnosis not present

## 2014-08-16 DIAGNOSIS — M25552 Pain in left hip: Principal | ICD-10-CM

## 2014-08-16 DIAGNOSIS — Z0000 Encounter for general adult medical examination without abnormal findings: Secondary | ICD-10-CM

## 2014-08-16 DIAGNOSIS — Z23 Encounter for immunization: Secondary | ICD-10-CM | POA: Diagnosis not present

## 2014-08-16 DIAGNOSIS — I10 Essential (primary) hypertension: Secondary | ICD-10-CM | POA: Diagnosis not present

## 2014-08-16 MED ORDER — HYDROCODONE-ACETAMINOPHEN 7.5-325 MG PO TABS: 1.0000 | ORAL_TABLET | Freq: Four times a day (QID) | ORAL | Status: DC | PRN

## 2014-08-16 NOTE — Progress Notes (Signed)
   Subjective:    Patient ID: [name], male    DOB: [birth year], 69 y.o.   MRN: [id number]  HPI [name] is here for a follow up. He follows the clinic for essential hypertension, GERD, and health maintenance issues. Today he offers no complaints. He denies any shortness of breath, chest pain, palpitations, dizziness or fainting. He denies any intercurrent illnesses  Review of Systems As above.    Objective:   Physical Exam  Constitutional: He is oriented to person, place, and time. He appears well-developed and well-nourished. No distress.  HENT:  Head: Normocephalic and atraumatic.  Eyes: Conjunctivae and EOM are normal. Pupils are equal, round, and reactive to light. Right eye exhibits no discharge. Left eye exhibits no discharge.  Congested conjunctiva chronically  Neck: Normal range of motion.  Cardiovascular: Normal rate, regular rhythm, normal heart sounds and intact distal pulses.   No murmur heard. Pulmonary/Chest: Effort normal and breath sounds normal. No respiratory distress. He has no wheezes. He has no rales. He exhibits no tenderness.  Abdominal: Soft.  Musculoskeletal: He exhibits no edema.  Neurological: He is alert and oriented to person, place, and time.  Skin: Skin is warm. He is not diaphoretic.      Assessment & Plan:   Please see problem based charting.

## 2014-08-16 NOTE — Assessment & Plan Note (Signed)
   Abdominal Ultrasound - yearly surveillance of AAA (<4.5 cm)  Prevnar 13 today

## 2014-08-16 NOTE — Assessment & Plan Note (Signed)
BP Readings from Last 3 Encounters:  08/16/14 142/83  05/24/14 173/88  05/10/14 132/81   Well controlled. Continue current management.

## 2014-08-16 NOTE — Patient Instructions (Signed)
[name] - Thank you for your visit.  Your blood pressure is doing well.  Please keep taking your medicines as you are.  Today we will give you a pneumonia shot called PREVNAR.  Information about the shot is attached below.  As always we discussed smoking and you informed me that you wish to continue. I am scheduling you for an ultrasound of your stomach to watch over your "Aortic Aneurysm" (abnormality in a blood vessel) See you in 3-4 months.  Thanks, Madilyn Fireman MD MPH 08/16/2014 8:47 AM   Pneumococcal Vaccine, Polyvalent suspension for injection What is this medicine? PNEUMOCOCCAL VACCINE, POLYVALENT (NEU mo KOK al vak SEEN, pol ee VEY luhnt) is a vaccine to prevent pneumococcus bacteria infection. These bacteria are a major cause of ear infections, 'Strep throat' infections, and serious pneumonia, meningitis, or blood infections worldwide. These vaccines help the body to produce antibodies (protective substances) that help your body defend against these bacteria. This vaccine is recommended for infants and young children. This vaccine will not treat an infection. This medicine may be used for other purposes; ask your health care provider or pharmacist if you have questions. COMMON BRAND NAME(S): Prevnar 13 What should I tell my health care provider before I take this medicine? They need to know if you have any of these conditions: -bleeding problems -fever -immune system problems -low platelet count in the blood -seizures -an unusual or allergic reaction to pneumococcal vaccine, diphtheria toxoid, other vaccines, latex, other medicines, foods, dyes, or preservatives -pregnant or trying to get pregnant -breast-feeding How should I use this medicine? This vaccine is for injection into a muscle. It is given by a health care professional. A copy of Vaccine Information Statements will be given before each vaccination. Read this sheet carefully each time. The sheet may change  frequently. Talk to your pediatrician regarding the use of this medicine in children. While this drug may be prescribed for children as young as 22 weeks old for selected conditions, precautions do apply. Overdosage: If you think you have taken too much of this medicine contact a poison control center or emergency room at once. NOTE: This medicine is only for you. Do not share this medicine with others. What if I miss a dose? It is important not to miss your dose. Call your doctor or health care professional if you are unable to keep an appointment. What may interact with this medicine? -medicines for cancer chemotherapy -medicines that suppress your immune function -medicines that treat or prevent blood clots like warfarin, enoxaparin, and dalteparin -steroid medicines like prednisone or cortisone This list may not describe all possible interactions. Give your health care provider a list of all the medicines, herbs, non-prescription drugs, or dietary supplements you use. Also tell them if you smoke, drink alcohol, or use illegal drugs. Some items may interact with your medicine. What should I watch for while using this medicine? Mild fever and pain should go away in 3 days or less. Report any unusual symptoms to your doctor or health care professional. What side effects may I notice from receiving this medicine? Side effects that you should report to your doctor or health care professional as soon as possible: -allergic reactions like skin rash, itching or hives, swelling of the face, lips, or tongue -breathing problems -confused -fever over 102 degrees F -pain, tingling, numbness in the hands or feet -seizures -unusual bleeding or bruising -unusual muscle weakness Side effects that usually do not require medical attention (report to your  doctor or health care professional if they continue or are bothersome): -aches and pains -diarrhea -fever of 102 degrees F or  less -headache -irritable -loss of appetite -pain, tender at site where injected -trouble sleeping This list may not describe all possible side effects. Call your doctor for medical advice about side effects. You may report side effects to FDA at 1-800-FDA-1088. Where should I keep my medicine? This does not apply. This vaccine is given in a clinic, pharmacy, doctor's office, or other health care setting and will not be stored at home. NOTE: This sheet is a summary. It may not cover all possible information. If you have questions about this medicine, talk to your doctor, pharmacist, or health care provider.  2015, Elsevier/Gold Standard. (2008-08-16 10:17:22)

## 2014-08-16 NOTE — Addendum Note (Signed)
Addended by: Marcelino Duster on: 08/16/2014 12:11 PM   Modules accepted: Orders

## 2014-08-16 NOTE — Assessment & Plan Note (Signed)
Patient does not want to quit.

## 2014-08-16 NOTE — Assessment & Plan Note (Signed)
Met with Dr Katy Fitch. They discussed surgery - he does not really need it right now, per Dr Katy Fitch. Next appointment in 6 months with him.

## 2014-08-16 NOTE — Assessment & Plan Note (Signed)
Abdominal Ultrasound order placed. Last one in 12/14

## 2014-08-26 ENCOUNTER — Ambulatory Visit (HOSPITAL_COMMUNITY): Payer: Commercial Managed Care - HMO

## 2014-09-02 ENCOUNTER — Ambulatory Visit (HOSPITAL_COMMUNITY)
Admission: RE | Admit: 2014-09-02 | Discharge: 2014-09-02 | Disposition: A | Discharge status: Home / Self Care | Payer: Commercial Managed Care - HMO | Source: Ambulatory Visit | Attending: Internal Medicine | Admitting: Internal Medicine

## 2014-09-02 DIAGNOSIS — I714 Abdominal aortic aneurysm, without rupture, unspecified: Secondary | ICD-10-CM

## 2014-09-02 DIAGNOSIS — Z0000 Encounter for general adult medical examination without abnormal findings: Secondary | ICD-10-CM

## 2014-09-02 DIAGNOSIS — I712 Thoracic aortic aneurysm, without rupture: Secondary | ICD-10-CM | POA: Diagnosis not present

## 2014-09-21 ENCOUNTER — Other Ambulatory Visit: Payer: Self-pay | Admitting: *Deleted

## 2014-09-21 DIAGNOSIS — M25551 Pain in right hip: Secondary | ICD-10-CM

## 2014-09-21 DIAGNOSIS — M25552 Pain in left hip: Principal | ICD-10-CM

## 2014-09-21 NOTE — Telephone Encounter (Signed)
I believe Dr Ellwood Dense is PCP. Amy Munson inappropriately changed PCP on 08/16/14. Have sent to her and Tamela Oddi who will change PCP back.

## 2014-09-22 MED ORDER — HYDROCODONE-ACETAMINOPHEN 7.5-325 MG PO TABS: 1.0000 | ORAL_TABLET | Freq: Four times a day (QID) | ORAL | Status: DC | PRN

## 2014-10-21 ENCOUNTER — Other Ambulatory Visit: Payer: Self-pay | Admitting: Internal Medicine

## 2014-10-24 ENCOUNTER — Other Ambulatory Visit: Payer: Self-pay | Admitting: *Deleted

## 2014-10-24 DIAGNOSIS — M25551 Pain in right hip: Secondary | ICD-10-CM

## 2014-10-24 DIAGNOSIS — M25552 Pain in left hip: Principal | ICD-10-CM

## 2014-10-24 MED ORDER — HYDROCODONE-ACETAMINOPHEN 7.5-325 MG PO TABS: 1.0000 | ORAL_TABLET | Freq: Four times a day (QID) | ORAL | Status: DC | PRN

## 2014-10-24 NOTE — Telephone Encounter (Signed)
Pt aware.

## 2014-11-22 ENCOUNTER — Other Ambulatory Visit: Payer: Self-pay | Admitting: *Deleted

## 2014-11-22 DIAGNOSIS — M25551 Pain in right hip: Secondary | ICD-10-CM

## 2014-11-22 DIAGNOSIS — M25552 Pain in left hip: Principal | ICD-10-CM

## 2014-11-22 NOTE — Telephone Encounter (Signed)
Last refill 5/10 Last OV 3/1 Pt # [phone]

## 2014-11-23 MED ORDER — HYDROCODONE-ACETAMINOPHEN 7.5-325 MG PO TABS: 1.0000 | ORAL_TABLET | Freq: Four times a day (QID) | ORAL | Status: DC | PRN

## 2014-12-02 IMAGING — CR DG WRIST COMPLETE 3+V*L*
4 series · 4 of 4 positions shown · non-contrast
Comparison: None.

CLINICAL DATA: Left wrist pain.

LEFT WRIST - COMPLETE 3+ VIEW

[x wrist pa left]
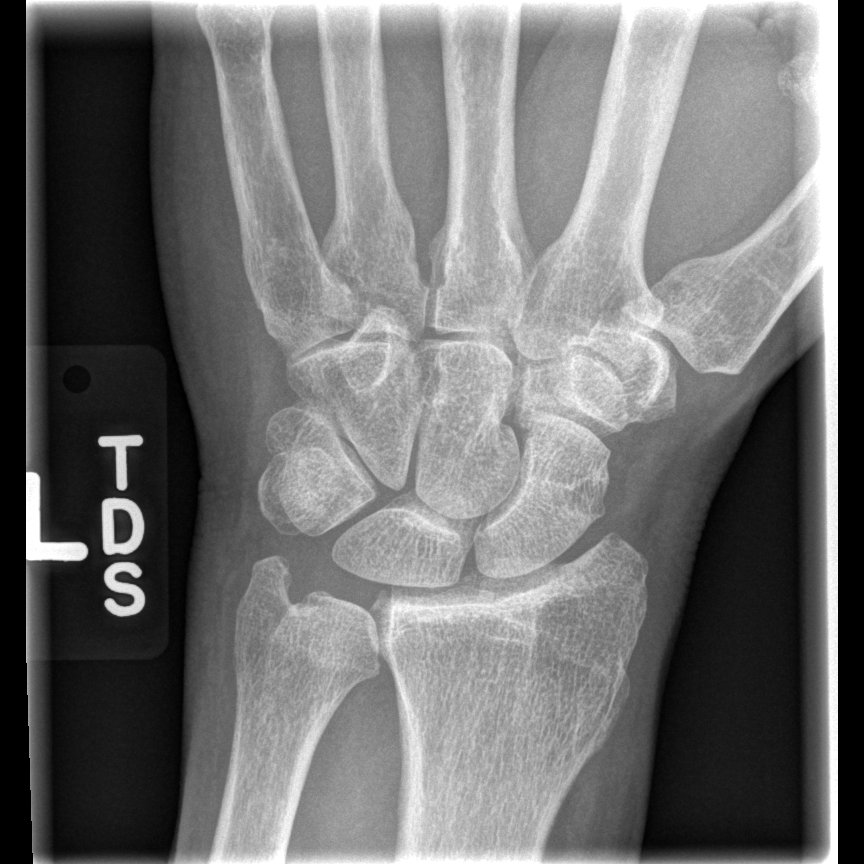

[x wrist obl left]
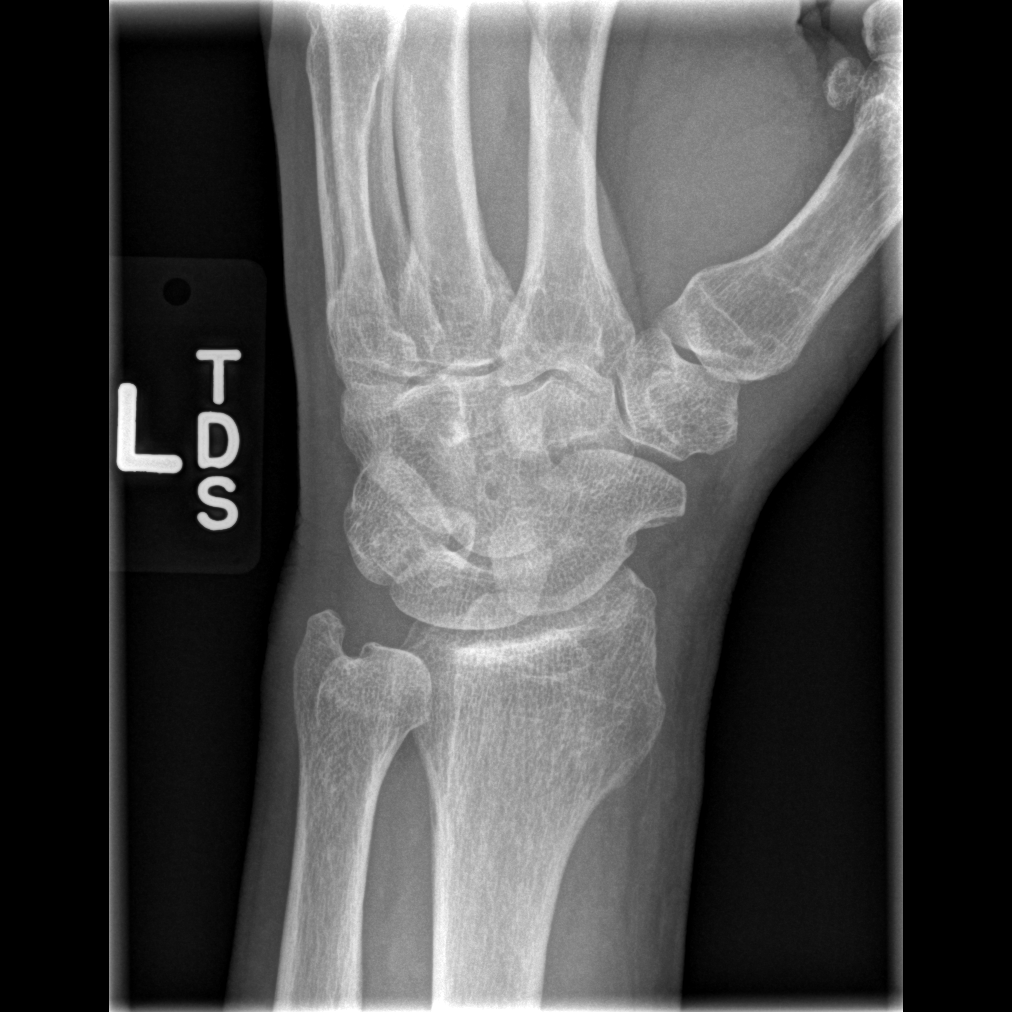

[x wrist lat left]
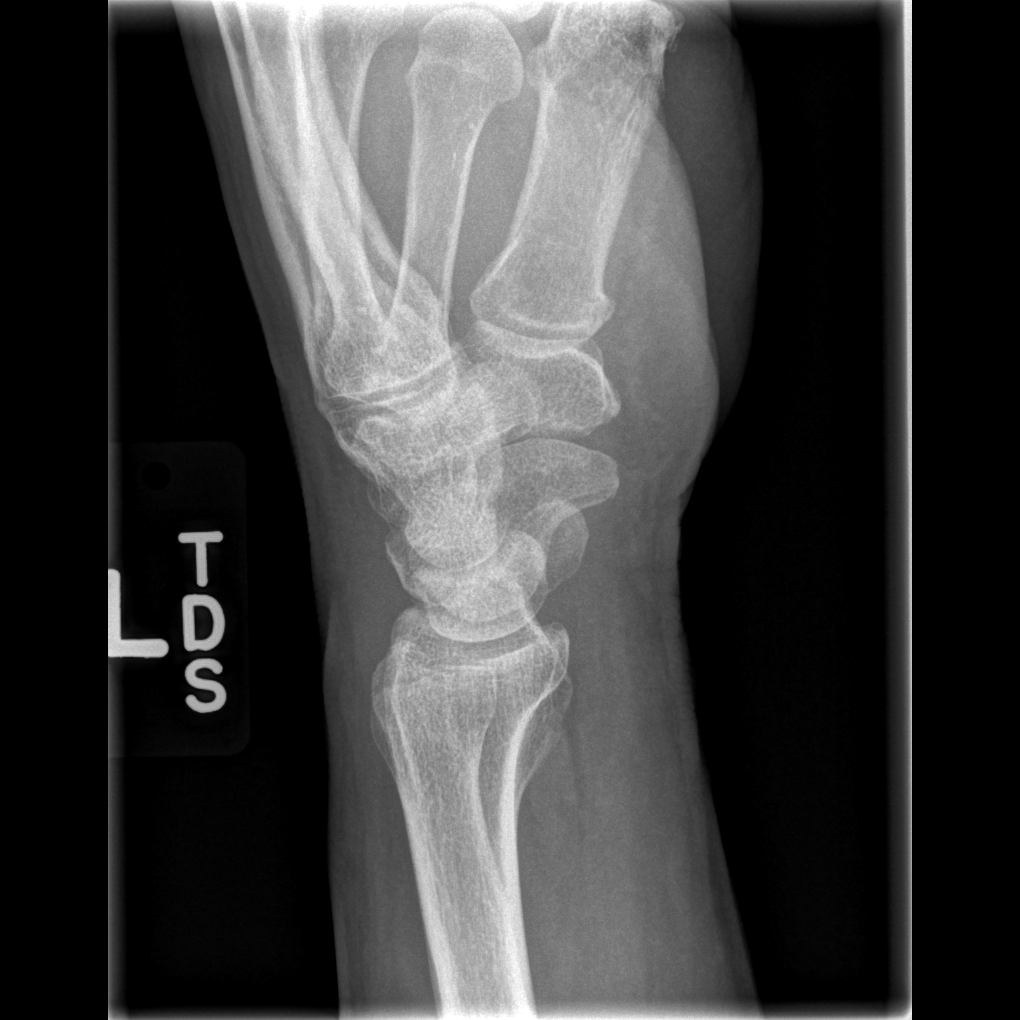

[x navicular]
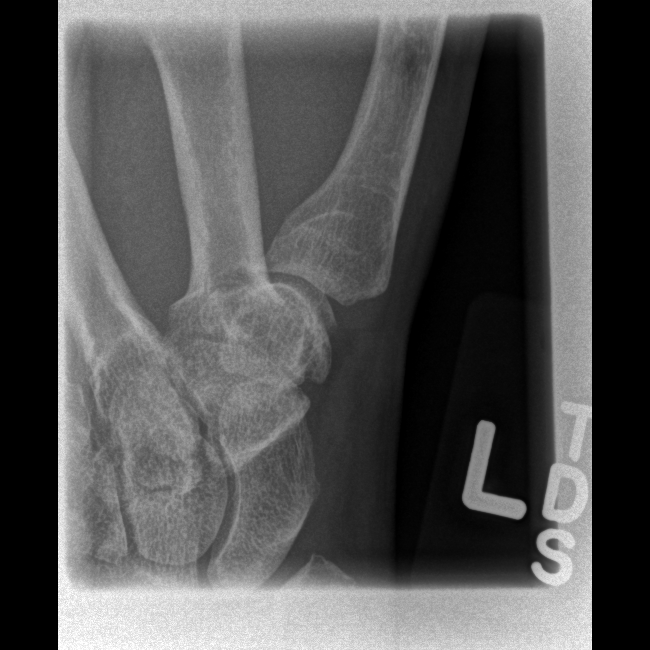

[4 of 4 positions shown; findings below may reference images not displayed]

FINDINGS: Mild degenerative changes are seen involving the carpal
joints and radiocarpal joint.  No evidence of fracture,
dislocation, bony lesion or erosion.  Soft tissues are
unremarkable.
IMPRESSION: Mild degenerative changes of the left wrist.

## 2014-12-29 ENCOUNTER — Ambulatory Visit (INDEPENDENT_AMBULATORY_CARE_PROVIDER_SITE_OTHER): Payer: Commercial Managed Care - HMO | Admitting: Internal Medicine

## 2014-12-29 ENCOUNTER — Encounter: Payer: Self-pay | Admitting: Internal Medicine

## 2014-12-29 VITALS — BP 131/66 | HR 64 | Temp 97.8°F | Wt 207.0 lb

## 2014-12-29 DIAGNOSIS — I10 Essential (primary) hypertension: Secondary | ICD-10-CM | POA: Diagnosis not present

## 2014-12-29 DIAGNOSIS — M1610 Unilateral primary osteoarthritis, unspecified hip: Secondary | ICD-10-CM

## 2014-12-29 DIAGNOSIS — K2270 Barrett's esophagus without dysplasia: Secondary | ICD-10-CM | POA: Diagnosis not present

## 2014-12-29 DIAGNOSIS — I2510 Atherosclerotic heart disease of native coronary artery without angina pectoris: Secondary | ICD-10-CM | POA: Diagnosis not present

## 2014-12-29 DIAGNOSIS — Z0000 Encounter for general adult medical examination without abnormal findings: Secondary | ICD-10-CM

## 2014-12-29 DIAGNOSIS — M129 Arthropathy, unspecified: Secondary | ICD-10-CM

## 2014-12-29 DIAGNOSIS — R0602 Shortness of breath: Secondary | ICD-10-CM

## 2014-12-29 DIAGNOSIS — M25551 Pain in right hip: Secondary | ICD-10-CM

## 2014-12-29 DIAGNOSIS — M1A0610 Idiopathic chronic gout, right knee, without tophus (tophi): Secondary | ICD-10-CM

## 2014-12-29 DIAGNOSIS — M25552 Pain in left hip: Secondary | ICD-10-CM

## 2014-12-29 DIAGNOSIS — R739 Hyperglycemia, unspecified: Secondary | ICD-10-CM

## 2014-12-29 LAB — COMPLETE METABOLIC PANEL WITH GFR
ALT: 35 U/L (ref 0–53)
AST: 34 U/L (ref 0–37)
Albumin: 3.3 g/dL — ABNORMAL LOW (ref 3.5–5.2)
Alkaline Phosphatase: 74 U/L (ref 39–117)
BILIRUBIN TOTAL: 0.7 mg/dL (ref 0.2–1.2)
BUN: 13 mg/dL (ref 6–23)
CO2: 26 mEq/L (ref 19–32)
CREATININE: 0.63 mg/dL (ref 0.50–1.35)
Calcium: 8.9 mg/dL (ref 8.4–10.5)
Chloride: 103 mEq/L (ref 96–112)
GFR, Est African American: >89 mL/min
GFR, Est Non African American: >89 mL/min
Glucose, Bld: 241 mg/dL — ABNORMAL HIGH (ref 70–99)
Potassium: 3.8 mEq/L (ref 3.5–5.3)
SODIUM: 139 meq/L (ref 135–145)
Total Protein: 6.5 g/dL (ref 6.0–8.3)

## 2014-12-29 LAB — LIPID PANEL
CHOL/HDL RATIO: 1.9 ratio
CHOLESTEROL: 82 mg/dL (ref 0–200)
HDL: 44 mg/dL (ref >=40)
LDL CALC: 25 mg/dL (ref 0–99)
Triglycerides: 66 mg/dL (ref <150)
VLDL: 13 mg/dL (ref 0–40)

## 2014-12-29 MED ORDER — HYDROCODONE-ACETAMINOPHEN 7.5-325 MG PO TABS: 1.0000 | ORAL_TABLET | Freq: Four times a day (QID) | ORAL | Status: DC | PRN

## 2014-12-29 NOTE — Assessment & Plan Note (Signed)
Refill for pain medications given today.

## 2014-12-29 NOTE — Assessment & Plan Note (Signed)
CMP, and lipid profile today.

## 2014-12-29 NOTE — Assessment & Plan Note (Signed)
BP Readings from Last 3 Encounters:  12/29/14 131/66  08/16/14 142/83  05/24/14 173/88   Well controlled on current medications. No change in medications.

## 2014-12-29 NOTE — Assessment & Plan Note (Signed)
Referral to GI given for follow up on EGD Patient's symptoms well controlled.

## 2014-12-29 NOTE — Progress Notes (Signed)
   Subjective:    Patient ID: [name], male    DOB: 08/06/1945, 69 y.o.   MRN: [id number]  HPI [name] is a 69 year old male with past medical history of gout, CAD with MI s/p stents in 2004, barrett's esophagus, hip osteoarthritis who comes in for a regular follow up. His only complaint is transient shortness of breath with exertion. He has no associated chest pain, or swelling of legs, orthopnea or PND. He requests a referral to Dr Olevia Perches, who was his cardiologist in the past. He has last seen him in 2007 and we do not have notes of that encounter.  For Barrett's esophagus, he had EGD in 2011 and has not followed up with GI in the past years. He does not report added symptoms. Per Dr Celesta Aver note he needs to follow up in 3 years.  He wants a refill on his pain medications.    Review of Systems  Constitutional: Negative for appetite change, fatigue and unexpected weight change.  HENT: Negative.   Eyes: Negative.   Respiratory: Positive for shortness of breath. Negative for apnea, cough, choking, chest tightness, wheezing and stridor.   Cardiovascular: Negative for chest pain, palpitations and leg swelling.  Gastrointestinal: Negative.   Endocrine: Negative.   Musculoskeletal: Positive for arthralgias. Negative for myalgias, back pain, joint swelling, gait problem, neck pain and neck stiffness.  Skin: Negative.   Neurological: Negative.        Objective:   Physical Exam  Constitutional: He is oriented to person, place, and time. He appears well-developed and well-nourished.  HENT:  Head: Normocephalic and atraumatic.  Eyes: Conjunctivae and EOM are normal. Pupils are equal, round, and reactive to light.  Neck: Normal range of motion. Neck supple.  Cardiovascular: Normal rate, regular rhythm and intact distal pulses.  Exam reveals no gallop and no friction rub.   No murmur heard. Pulmonary/Chest: Effort normal and breath sounds normal. No respiratory distress. He has  no wheezes. He has no rales. He exhibits no tenderness.  Abdominal: Soft.  Musculoskeletal: He exhibits no edema.  Neurological: He is alert and oriented to person, place, and time.  Skin: Skin is warm.      Assessment & Plan:   Please see problem based charting.

## 2014-12-29 NOTE — Assessment & Plan Note (Signed)
Shortness of breath reported, do not think patient is in active cardiac failure. The patient insists on following up with his old cardiologist. I will give him a referral at this time.

## 2014-12-29 NOTE — Patient Instructions (Signed)
[name],   Today you have been referred to:  CARDIOLOGY - per your request, because you would like to follow up on your prior MI.  GI : To follow up for an upper endoscopy for your esophagus (food pipe) The clinic will schedule these appointments for you and call you with the dates  I am glad you are taking all your medications.  We will do some preventive blood work today for your body salts, kidneys and cholesterol etc.  If results are abnormal, I will call you back.   Madilyn Fireman MD MPH 12/29/2014 9:05 AM [facility] [phone]

## 2014-12-30 ENCOUNTER — Telehealth: Payer: Self-pay | Admitting: *Deleted

## 2014-12-30 DIAGNOSIS — R7303 Prediabetes: Secondary | ICD-10-CM | POA: Insufficient documentation

## 2014-12-30 NOTE — Assessment & Plan Note (Signed)
We will call the patient back for labs - Fasting Blood Sugar and A1c within the next week

## 2014-12-30 NOTE — Telephone Encounter (Signed)
Dr Ellwood Dense needs pt to come back for fasting labs; stated his glucose level is 241. Pt called / informed - appt scheduled for Monday 7/18 @ 0830AM.

## 2014-12-30 NOTE — Telephone Encounter (Signed)
Thank you Glenda. This can only be a "Labs only" appointment. If the labs are off, and we find that he has diabetes, we can then call him in for results and management.

## 2014-12-30 NOTE — Addendum Note (Signed)
Addended by: Madilyn Fireman on: 12/30/2014 10:19 AM   Modules accepted: Orders

## 2015-01-02 ENCOUNTER — Other Ambulatory Visit (INDEPENDENT_AMBULATORY_CARE_PROVIDER_SITE_OTHER): Payer: Commercial Managed Care - HMO

## 2015-01-02 DIAGNOSIS — R739 Hyperglycemia, unspecified: Secondary | ICD-10-CM

## 2015-01-02 LAB — GLUCOSE, CAPILLARY: Glucose-Capillary: 87 mg/dL (ref 65–99)

## 2015-01-02 LAB — POCT GLYCOSYLATED HEMOGLOBIN (HGB A1C): HEMOGLOBIN A1C: 6.1

## 2015-01-02 NOTE — Addendum Note (Signed)
Addended by: Truddie Crumble on: 01/02/2015 09:05 AM   Modules accepted: Orders

## 2015-01-13 ENCOUNTER — Encounter: Payer: Self-pay | Admitting: Internal Medicine

## 2015-01-13 ENCOUNTER — Ambulatory Visit (INDEPENDENT_AMBULATORY_CARE_PROVIDER_SITE_OTHER): Payer: Commercial Managed Care - HMO | Admitting: Internal Medicine

## 2015-01-13 VITALS — BP 114/66 | HR 66 | Ht 71.5 in | Wt 204.8 lb

## 2015-01-13 DIAGNOSIS — R0602 Shortness of breath: Secondary | ICD-10-CM | POA: Diagnosis not present

## 2015-01-13 DIAGNOSIS — I2510 Atherosclerotic heart disease of native coronary artery without angina pectoris: Secondary | ICD-10-CM | POA: Diagnosis not present

## 2015-01-13 DIAGNOSIS — I700 Atherosclerosis of aorta: Secondary | ICD-10-CM | POA: Insufficient documentation

## 2015-01-13 DIAGNOSIS — I10 Essential (primary) hypertension: Secondary | ICD-10-CM | POA: Diagnosis not present

## 2015-01-13 LAB — CBC WITH DIFFERENTIAL/PLATELET
Basophils Absolute: 0 10*3/uL (ref 0.0–0.1)
Basophils Relative: 0.5 % (ref 0.0–3.0)
Eosinophils Absolute: 0.4 10*3/uL (ref 0.0–0.7)
Eosinophils Relative: 5 % (ref 0.0–5.0)
HCT: 42.8 % (ref 39.0–52.0)
Hemoglobin: 13.7 g/dL (ref 13.0–17.0)
Lymphocytes Relative: 26.2 % (ref 12.0–46.0)
Lymphs Abs: 1.9 10*3/uL (ref 0.7–4.0)
MCHC: 32 g/dL (ref 30.0–36.0)
MCV: 97.8 fl (ref 78.0–100.0)
MONO ABS: 0.5 10*3/uL (ref 0.1–1.0)
Monocytes Relative: 7 % (ref 3.0–12.0)
NEUTROS ABS: 4.5 10*3/uL (ref 1.4–7.7)
Neutrophils Relative %: 61.3 % (ref 43.0–77.0)
Platelets: 143 10*3/uL — ABNORMAL LOW (ref 150.0–400.0)
RBC: 4.38 Mil/uL (ref 4.22–5.81)
RDW: 16.3 % — AB (ref 11.5–15.5)
WBC: 7.3 10*3/uL (ref 4.0–10.5)

## 2015-01-13 LAB — TSH: TSH: 1.13 u[IU]/mL (ref 0.35–4.50)

## 2015-01-13 NOTE — Patient Instructions (Addendum)
Medication Instructions:  Your physician recommends that you continue on your current medications as directed. Please refer to the Current Medication list given to you today.   Labwork: Your physician recommends that you return for lab work today: CBC/TSH   Testing/Procedures: Your physician has requested that you have a lexiscan myoview. For further information please visit [email or web address]. Please follow instruction sheet, as given.     Follow-Up: Your physician wants you to follow-up in: 6 months with Dr Theressa Stamps will receive a reminder letter in the mail two months in advance. If you don't receive a letter, please call our office to schedule the follow-up appointment.    Any Other Special Instructions Will Be Listed Below (If Applicable).

## 2015-01-13 NOTE — Progress Notes (Signed)
Cardiology Office Note   Date:  01/13/2015   ID:  [name], DOB 04-27-1946, MRN [id number]  PCP:  Madilyn Fireman, MD  Cardiologist:   Dorris Carnes, MD   No chief complaint on file.  Pt presents for f/u of CAD     History of Present Illness: [name] is a 69 y.o. male with a history of CAD with MI  In 2004  (Cath:  LM OK  LAD irreg  LCx 30% med  50% distal  RCA:  90% prox  s/p stents Stent to prox RCA Stent to mid LCx with taxus stent   Patient also has a history of gout,  barrett's esophagus, hip osteoarthritis.  Patient says that he has noticed increase SOB  Gives out when does things  NO CP    2004 had CP   When walking or has to climb hill has to stop and catch breath   No syncope No dizziness  Gets tired   No PND      Current Outpatient Prescriptions  Medication Sig Dispense Refill  . allopurinol (ZYLOPRIM) 100 MG tablet Take 1 tablet (100 mg total) by mouth daily. 90 tablet 3  . amLODipine (NORVASC) 5 MG tablet Take 1 tablet (5 mg total) by mouth daily. 90 tablet 3  . aspirin EC 81 MG tablet Take 81 mg by mouth daily.    . colchicine (COLCRYS) 0.6 MG tablet Take 1 tablet twice daily as needed. 30 tablet 0  . furosemide (LASIX) 20 MG tablet TAKE 1 TABLET (20 MG TOTAL) BY MOUTH DAILY. 90 tablet 3  . HYDROcodone-acetaminophen (NORCO) 7.5-325 MG per tablet Take 1 tablet by mouth every 6 (six) hours as needed for moderate pain. 120 tablet 0  . metoprolol tartrate (LOPRESSOR) 25 MG tablet TAKE 1 TABLET TWICE DAILY 180 tablet 6  . Multiple Vitamin (MULTIVITAMIN) tablet Take 1 tablet by mouth daily.    Marland Kitchen omeprazole (PRILOSEC) 20 MG capsule TAKE 1 CAPSULE TWICE DAILY 180 capsule 3  . rosuvastatin (CRESTOR) 20 MG tablet Take 1 tablet (20 mg total) by mouth daily. 90 tablet 3   No current facility-administered medications for this visit.    Allergies:   Candesartan cilexetil-hctz; Hydrochlorothiazide; and Shellfish allergy   Past Medical History  Diagnosis Date    . Diverticulosis of colon   . GERD (gastroesophageal reflux disease)   . Gout   . Hypertension   . Hyperlipidemia   . Renal failure, acute     hx of 2/2 medication, resolved  . Barretts esophagus     bx distal esophagus 2011 neg   . Tobacco abuse   . Alcohol abuse   . Hx of adenomatous colonic polyps   . CHF (congestive heart failure)   . Hiatal hernia     2 cm noted on upper endos 2011   . Cataract   . Internal hemorrhoid   . DJD (degenerative joint disease)     left hip  . Myocardial infarction     hx of, 2 stents 2004. pt was on Plavix x 3 years then transitioned to ASA  . 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 Cataract   . Cholelithiasis   . UTI (lower urinary tract infection)   . AAA (abdominal aortic aneurysm)     noted CT 11/2012 size 3.3 x 3.2 cm    Past Surgical History  Procedure Laterality Date  . Lung lobectomy  11/07  RUL  . Flexible cystourethroscopy  05/24/06  . Other surgical history      hernia repair 2005   . Hernia repair    . Colonoscopy    . Upper gastrointestinal endoscopy       Social History:  The patient  reports that he has been smoking Cigarettes.  He has been smoking about 0.50 packs per day. He has never used smokeless tobacco. He reports that he drinks alcohol. He reports that he does not use illicit drugs.   Family History:  The patient's family history includes Arthritis in his brother; Diabetes in his mother and sister; Gout in his brother; Heart disease in his sister; Hypertension in his sister. There is no history of Colon cancer, Esophageal cancer, Rectal cancer, or Stomach cancer.    ROS:  Please see the history of present illness. All other systems are reviewed and  Negative to the above problem except as noted.    PHYSICAL EXAM: VS:  BP 114/66 mmHg  Pulse 66  Ht 5' 11.5" (1.816 m)  Wt 204 lb 12.8 oz (92.897 kg)  BMI 28.17 kg/m2  GEN: Well nourished, well developed, in no acute  distress HEENT: normal Neck: no JVD, carotid bruits, or masses Cardiac: RRR; no murmurs, rubs, or gallops,no edema  Respiratory:  clear to auscultation bilaterally, normal work of breathing GI: soft, nontender, nondistended, + BS  No hepatomegaly  MS: no deformity Moving all extremities   Skin: warm and dry, no rash Neuro:  Strength and sensation are intact Psych: euthymic mood, full affect   EKG:  EKG is ordered today.  SR 66   First degree AV block  PR 216  LBBB.     Lipid Panel    Component Value Date/Time   CHOL 82 12/29/2014 0910   TRIG 66 12/29/2014 0910   HDL 44 12/29/2014 0910   CHOLHDL 1.9 12/29/2014 0910   VLDL 13 12/29/2014 0910   LDLCALC 25 12/29/2014 0910      Wt Readings from Last 3 Encounters:  01/13/15 204 lb 12.8 oz (92.897 kg)  12/29/14 207 lb (93.895 kg)  08/16/14 208 lb 4.8 oz (94.484 kg)      ASSESSMENT AND PLAN:  1  CAD  Remote intervention  NO CP  But, dyspnea on exertion  Will get CBC and TSH  ALso set up for lexiscan myoview  2.  HL  Interestingly that with no change in medicines LDL is now in 20s  ? etiol    3.  HTN  Adquate control    F/U 6 months, sooner based on test results      Signed, Dorris Carnes, MD  01/13/2015 9:32 AM    [facility] [place], [place], [place]  [address] Phone: [phone]; Fax: [phone]

## 2015-01-16 ENCOUNTER — Telehealth (HOSPITAL_COMMUNITY): Payer: Self-pay

## 2015-01-16 NOTE — Telephone Encounter (Signed)
Patient given detailed instructions per Myocardial Perfusion Study Information Sheet for test on 01-18-2015 at 12:30. Patient Notified to arrive 15 minutes early, and that it is imperative to arrive on time for appointment to keep from having the test rescheduled. Patient verbalized understanding. Oletta Lamas, Tashauna Caisse A

## 2015-01-18 ENCOUNTER — Ambulatory Visit (HOSPITAL_COMMUNITY): Payer: Commercial Managed Care - HMO | Attending: Cardiology

## 2015-01-18 ENCOUNTER — Encounter: Payer: Self-pay | Admitting: Cardiology

## 2015-01-18 DIAGNOSIS — I2510 Atherosclerotic heart disease of native coronary artery without angina pectoris: Secondary | ICD-10-CM

## 2015-01-18 DIAGNOSIS — R9439 Abnormal result of other cardiovascular function study: Secondary | ICD-10-CM | POA: Diagnosis not present

## 2015-01-18 DIAGNOSIS — R0602 Shortness of breath: Secondary | ICD-10-CM | POA: Diagnosis not present

## 2015-01-18 LAB — MYOCARDIAL PERFUSION IMAGING
CHL CUP NUCLEAR SDS: 1
CHL CUP RESTING HR STRESS: 67 {beats}/min
LHR: 0.27
LV dias vol: 121 mL
LVSYSVOL: 56 mL
Peak HR: 86 {beats}/min
SRS: 7
SSS: 8
TID: 0.9

## 2015-01-18 MED ORDER — TECHNETIUM TC 99M SESTAMIBI GENERIC - CARDIOLITE
10.5000 | Freq: Once | INTRAVENOUS | Status: AC | PRN
  Administered 2015-01-18: 11 via INTRAVENOUS

## 2015-01-18 MED ORDER — TECHNETIUM TC 99M SESTAMIBI GENERIC - CARDIOLITE
31.8000 | Freq: Once | INTRAVENOUS | Status: AC | PRN
  Administered 2015-01-18: 31.8 via INTRAVENOUS

## 2015-01-18 MED ORDER — REGADENOSON 0.4 MG/5ML IV SOLN
0.4000 mg | Freq: Once | INTRAVENOUS | Status: AC
  Administered 2015-01-18: 0.4 mg via INTRAVENOUS

## 2015-01-24 ENCOUNTER — Encounter (HOSPITAL_COMMUNITY): Payer: Self-pay

## 2015-01-24 ENCOUNTER — Other Ambulatory Visit (HOSPITAL_BASED_OUTPATIENT_CLINIC_OR_DEPARTMENT_OTHER): Payer: Commercial Managed Care - HMO

## 2015-01-24 ENCOUNTER — Ambulatory Visit (HOSPITAL_COMMUNITY)
Admission: RE | Admit: 2015-01-24 | Discharge: 2015-01-24 | Disposition: A | Discharge status: Home / Self Care | Payer: Commercial Managed Care - HMO | Source: Ambulatory Visit | Attending: Internal Medicine | Admitting: Internal Medicine

## 2015-01-24 DIAGNOSIS — I700 Atherosclerosis of aorta: Secondary | ICD-10-CM | POA: Diagnosis not present

## 2015-01-24 DIAGNOSIS — I2510 Atherosclerotic heart disease of native coronary artery without angina pectoris: Secondary | ICD-10-CM | POA: Diagnosis not present

## 2015-01-24 DIAGNOSIS — C3491 Malignant neoplasm of unspecified part of right bronchus or lung: Secondary | ICD-10-CM | POA: Diagnosis not present

## 2015-01-24 DIAGNOSIS — Z902 Acquired absence of lung [part of]: Secondary | ICD-10-CM | POA: Diagnosis not present

## 2015-01-24 DIAGNOSIS — C3490 Malignant neoplasm of unspecified part of unspecified bronchus or lung: Secondary | ICD-10-CM

## 2015-01-24 DIAGNOSIS — R0602 Shortness of breath: Secondary | ICD-10-CM | POA: Insufficient documentation

## 2015-01-24 DIAGNOSIS — N289 Disorder of kidney and ureter, unspecified: Secondary | ICD-10-CM | POA: Insufficient documentation

## 2015-01-24 DIAGNOSIS — J432 Centrilobular emphysema: Secondary | ICD-10-CM | POA: Insufficient documentation

## 2015-01-24 DIAGNOSIS — I252 Old myocardial infarction: Secondary | ICD-10-CM | POA: Insufficient documentation

## 2015-01-24 DIAGNOSIS — I213 ST elevation (STEMI) myocardial infarction of unspecified site: Secondary | ICD-10-CM | POA: Diagnosis not present

## 2015-01-24 DIAGNOSIS — Z85118 Personal history of other malignant neoplasm of bronchus and lung: Secondary | ICD-10-CM

## 2015-01-24 DIAGNOSIS — K760 Fatty (change of) liver, not elsewhere classified: Secondary | ICD-10-CM | POA: Insufficient documentation

## 2015-01-24 DIAGNOSIS — I288 Other diseases of pulmonary vessels: Secondary | ICD-10-CM | POA: Diagnosis not present

## 2015-01-24 DIAGNOSIS — K8020 Calculus of gallbladder without cholecystitis without obstruction: Secondary | ICD-10-CM | POA: Diagnosis not present

## 2015-01-24 LAB — CBC WITH DIFFERENTIAL/PLATELET
BASO%: 1.1 % (ref 0.0–2.0)
BASOS ABS: 0.1 10*3/uL (ref 0.0–0.1)
EOS ABS: 0.2 10*3/uL (ref 0.0–0.5)
EOS%: 3.3 % (ref 0.0–7.0)
HCT: 43.1 % (ref 38.4–49.9)
HGB: 14.1 g/dL (ref 13.0–17.1)
LYMPH%: 26.1 % (ref 14.0–49.0)
MCH: 31.8 pg (ref 27.2–33.4)
MCHC: 32.8 g/dL (ref 32.0–36.0)
MCV: 97 fL (ref 79.3–98.0)
MONO#: 0.5 10*3/uL (ref 0.1–0.9)
MONO%: 7.5 % (ref 0.0–14.0)
NEUT#: 3.8 10*3/uL (ref 1.5–6.5)
NEUT%: 62 % (ref 39.0–75.0)
Platelets: 171 10*3/uL (ref 140–400)
RBC: 4.45 10*6/uL (ref 4.20–5.82)
RDW: 16.2 % — ABNORMAL HIGH (ref 11.0–14.6)
WBC: 6.1 10*3/uL (ref 4.0–10.3)
lymph#: 1.6 10*3/uL (ref 0.9–3.3)

## 2015-01-24 LAB — COMPREHENSIVE METABOLIC PANEL (CC13)
ALT: 21 U/L (ref 0–55)
AST: 30 U/L (ref 5–34)
Albumin: 3.7 g/dL (ref 3.5–5.0)
Alkaline Phosphatase: 83 U/L (ref 40–150)
Anion Gap: 8 mEq/L (ref 3–11)
BUN: 13.9 mg/dL (ref 7.0–26.0)
CO2: 27 mEq/L (ref 22–29)
CREATININE: 1.2 mg/dL (ref 0.7–1.3)
Calcium: 9.6 mg/dL (ref 8.4–10.4)
Chloride: 107 mEq/L (ref 98–109)
EGFR: 70 mL/min/{1.73_m2} — AB (ref >90)
Glucose: 111 mg/dl (ref 70–140)
POTASSIUM: 3.8 meq/L (ref 3.5–5.1)
SODIUM: 142 meq/L (ref 136–145)
TOTAL PROTEIN: 7.4 g/dL (ref 6.4–8.3)
Total Bilirubin: 0.6 mg/dL (ref 0.20–1.20)

## 2015-01-30 ENCOUNTER — Encounter: Payer: Self-pay | Admitting: Internal Medicine

## 2015-01-30 ENCOUNTER — Telehealth: Payer: Self-pay | Admitting: Internal Medicine

## 2015-01-30 ENCOUNTER — Ambulatory Visit (HOSPITAL_BASED_OUTPATIENT_CLINIC_OR_DEPARTMENT_OTHER): Payer: Commercial Managed Care - HMO | Admitting: Internal Medicine

## 2015-01-30 ENCOUNTER — Other Ambulatory Visit: Payer: Self-pay | Admitting: Internal Medicine

## 2015-01-30 VITALS — BP 143/73 | HR 60 | Temp 97.9°F | Resp 18 | Ht 71.0 in | Wt 202.7 lb

## 2015-01-30 DIAGNOSIS — Z85118 Personal history of other malignant neoplasm of bronchus and lung: Secondary | ICD-10-CM | POA: Diagnosis not present

## 2015-01-30 DIAGNOSIS — M25552 Pain in left hip: Principal | ICD-10-CM

## 2015-01-30 DIAGNOSIS — M25551 Pain in right hip: Secondary | ICD-10-CM

## 2015-01-30 DIAGNOSIS — C3491 Malignant neoplasm of unspecified part of right bronchus or lung: Secondary | ICD-10-CM

## 2015-01-30 NOTE — Telephone Encounter (Signed)
Last filled at [facility] 12/31/2014 Last visit 7/14 Last uds 2014

## 2015-01-30 NOTE — Telephone Encounter (Signed)
per pof to sch pt appt-gave pt copy of avs-adv [facility] will call to sch scan(CT)

## 2015-01-30 NOTE — Telephone Encounter (Signed)
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 Appreciate it. Thanks, Yailene Badia.

## 2015-01-30 NOTE — Addendum Note (Signed)
Addended by: Ardeen Garland on: 01/30/2015 09:32 AM   Modules accepted: Medications

## 2015-01-30 NOTE — Telephone Encounter (Signed)
Patient requesting his hydrocodone to be filled. Patient uses [facility] on [address].

## 2015-01-30 NOTE — Progress Notes (Signed)
[place] [phone]   [phone]  OFFICE PROGRESS NOTE  Madilyn Fireman, MD [address] [address]  DIAGNOSIS: Stage IB (T2, N0, M0) non-small cell lung cancer diagnosed in November of 2007   PRIOR THERAPY: Status post right upper lobectomy with mediastinal lymph node dissection under the care of Dr. Roxan Hockey on 04/28/2006. The tumor size was 8 CM but the patient refused adjuvant chemotherapy at that time.   CURRENT THERAPY: Observation.  INTERVAL HISTORY: QUADIR MUNS 69 y.o. male returns to the clinic today for annual followup visit. He has been observation for the last 9 years and doing well. The patient is feeling fine today with no specific complaints. He denied having any significant chest pain,shortness of breath, cough or hemoptysis. He has no significant weight loss or night sweats. No nausea or vomiting, no fever or chills. He was seen by his cardiologist recently and no significant change for the last few years. The patient had repeat CT scan of the chest performed recently and he is here for evaluation and discussion of his scan results.  MEDICAL HISTORY: Past Medical History  Diagnosis Date  . Diverticulosis of colon   . GERD (gastroesophageal reflux disease)   . Gout   . Hypertension   . Hyperlipidemia   . Renal failure, acute     hx of 2/2 medication, resolved  . Barretts esophagus     bx distal esophagus 2011 neg   . Tobacco abuse   . Alcohol abuse   . Hx of adenomatous colonic polyps   . CHF (congestive heart failure)   . Hiatal hernia     2 cm noted on upper endos 2011   . Cataract   . Internal hemorrhoid   . DJD (degenerative joint disease)     left hip  . Myocardial infarction     hx of, 2 stents 2004. pt was on Plavix x 3 years then transitioned to ASA  . Cataract   . Cholelithiasis   . UTI (lower urinary tract infection)   . AAA (abdominal aortic aneurysm)     noted CT  11/2012 size 3.3 x 3.2 cm  . 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  MEDICATIONS:  Current Outpatient Prescriptions  Medication Sig Dispense Refill  . allopurinol (ZYLOPRIM) 100 MG tablet Take 1 tablet (100 mg total) by mouth daily. 90 tablet 3  . amLODipine (NORVASC) 5 MG tablet Take 1 tablet (5 mg total) by mouth daily. 90 tablet 3  . aspirin EC 81 MG tablet Take 81 mg by mouth daily.    . colchicine (COLCRYS) 0.6 MG tablet Take 1 tablet twice daily as needed. 30 tablet 0  . furosemide (LASIX) 20 MG tablet TAKE 1 TABLET (20 MG TOTAL) BY MOUTH DAILY. 90 tablet 3  . HYDROcodone-acetaminophen (NORCO) 7.5-325 MG per tablet Take 1 tablet by mouth every 6 (six) hours as needed for moderate pain. 120 tablet 0  . metoprolol tartrate (LOPRESSOR) 25 MG tablet TAKE 1 TABLET TWICE DAILY 180 tablet 6  . Multiple Vitamin (MULTIVITAMIN) tablet Take 1 tablet by mouth daily.    Marland Kitchen omeprazole (PRILOSEC) 20 MG capsule TAKE 1 CAPSULE TWICE DAILY 180 capsule 3  . rosuvastatin (CRESTOR) 20 MG tablet Take 1 tablet (20 mg total) by mouth daily. 90 tablet 3  No current facility-administered medications for this visit.    SURGICAL HISTORY:  Past Surgical History  Procedure Laterality Date  . Lung lobectomy  11/07    RUL  . Flexible cystourethroscopy  05/24/06  . Other surgical history      hernia repair 2005   . Hernia repair    . Colonoscopy    . Upper gastrointestinal endoscopy      REVIEW OF SYSTEMS:  A comprehensive review of systems was negative.   PHYSICAL EXAMINATION: General appearance: alert, cooperative and no distress Head: Normocephalic, without obvious abnormality, atraumatic Neck: no adenopathy Lymph nodes: Cervical, supraclavicular, and axillary nodes normal. Resp: clear to auscultation bilaterally Cardio: regular rate and rhythm, S1, S2  normal, no murmur, click, rub or gallop GI: soft, non-tender; bowel sounds normal; no masses,  no organomegaly Extremities: extremities normal, atraumatic, no cyanosis or edema  ECOG PERFORMANCE STATUS: 1 - Symptomatic but completely ambulatory  Blood pressure 143/73, pulse 60, temperature 97.9 F (36.6 C), temperature source Oral, resp. rate 18, height '5\' 11"'$  (1.803 m), weight 202 lb 11.2 oz (91.944 kg), SpO2 97 %.  LABORATORY DATA: Lab Results  Component Value Date   WBC 6.1 01/24/2015   HGB 14.1 01/24/2015   HCT 43.1 01/24/2015   MCV 97.0 01/24/2015   PLT 171 01/24/2015      Chemistry      Component Value Date/Time   NA 142 01/24/2015 0818   NA 139 12/29/2014 0910   NA 143 01/25/2011 0901   K 3.8 01/24/2015 0818   K 3.8 12/29/2014 0910   K 4.2 01/25/2011 0901   CL 103 12/29/2014 0910   CL 101 01/25/2011 0901   CO2 27 01/24/2015 0818   CO2 26 12/29/2014 0910   CO2 26 01/25/2011 0901   BUN 13.9 01/24/2015 0818   BUN 13 12/29/2014 0910   BUN 15 01/25/2011 0901   CREATININE 1.2 01/24/2015 0818   CREATININE 0.63 12/29/2014 0910   CREATININE 1.31 01/22/2013 0800      Component Value Date/Time   CALCIUM 9.6 01/24/2015 0818   CALCIUM 8.9 12/29/2014 0910   CALCIUM 9.1 01/25/2011 0901   ALKPHOS 83 01/24/2015 0818   ALKPHOS 74 12/29/2014 0910   ALKPHOS 93* 01/25/2011 0901   AST 30 01/24/2015 0818   AST 34 12/29/2014 0910   AST 35 01/25/2011 0901   ALT 21 01/24/2015 0818   ALT 35 12/29/2014 0910   ALT 21 01/25/2011 0901   BILITOT 0.60 01/24/2015 0818   BILITOT 0.7 12/29/2014 0910   BILITOT 0.60 01/25/2011 0901       RADIOGRAPHIC STUDIES: Ct Chest Wo Contrast  01/24/2015   CLINICAL DATA:  69 year old male with shortness of breath. Restaging lung cancer (right-sided lung cancer originally diagnosed in 2007 status post right upper lobectomy).  EXAM: CT CHEST WITHOUT CONTRAST  TECHNIQUE: Multidetector CT imaging of the chest was performed following the standard  protocol without IV contrast.  COMPARISON:  Chest CT 01/26/2014.  FINDINGS: Mediastinum/Lymph Nodes: Heart size is normal. There is no significant pericardial fluid, thickening or pericardial calcification. There is atherosclerosis of the thoracic aorta, the great vessels of the mediastinum and the coronary arteries, including calcified atherosclerotic plaque in the left main, left anterior descending, left circumflex and right coronary arteries. Extensive curvilinear hypoattenuation is noted throughout the left ventricular myocardium, compatible with multiple areas of fibrofatty metaplasia suggesting multiple prior myocardial infarctions. Mild dilatation of the pulmonic trunk (3.5 cm in diameter). No pathologically enlarged mediastinal or hilar lymph  nodes. Please note that accurate exclusion of hilar adenopathy is limited on noncontrast CT scans. Esophagus is unremarkable in appearance. No axillary lymphadenopathy.  Lungs/Pleura: Status post right upper lobectomy. Compensatory hyperexpansion of the right middle and lower lobes. No suspicious appearing pulmonary nodules or masses. Mild centrilobular and paraseptal emphysema. No acute consolidative airspace disease. No pleural effusions.  Upper Abdomen: Diffuse low attenuation throughout the hepatic parenchyma, compatible with hepatic steatosis. Calcified gallstones noted in the gallbladder. Incompletely visualized intermediate to high attenuation lesions in the posterior aspect of the upper pole of the left kidney appears similar to the prior examination, likely small proteinaceous cysts, largest of which measures 1 cm.  Musculoskeletal/Soft Tissues: Postthoracotomy changes in the right hemithorax. There are no aggressive appearing lytic or blastic lesions noted in the visualized portions of the skeleton.  IMPRESSION: 1. Stable postoperative findings following right upper lobectomy, without evidence of recurrent disease or metastatic disease in the thorax. 2. Mild  dilatation of the pulmonic trunk (3.5 cm in diameter) suggesting underlying pulmonary arterial hypertension. 3. Atherosclerosis, including left main and 3 vessel coronary artery disease. Evidence of multiple prior myocardial infarctions with areas of fibrofatty metaplasia throughout the left ventricular myocardium. Assessment for potential risk factor modification, dietary therapy or pharmacologic therapy may be warranted, if clinically indicated. 4. Hepatic steatosis. 5. Cholelithiasis. 6. Additional incidental findings, similar to prior examinations, as above.   Electronically Signed   By: Vinnie Langton M.D.   On: 01/24/2015 09:41   ASSESSMENT AND PLAN: This is a very pleasant 69 years old [place] American male with history of stage IB non-small cell lung cancer status post right upper lobectomy with mediastinal lymph node dissection and has been observation for the last 9 years with no significant evidence for disease recurrence.  I discussed the scan results with the patient today.  I recommended for him to continue on observation with repeat CT scan of the chest without contrast in one year.  He was advised to call immediately she has any concerning symptoms in the interval.  The patient voices understanding of current disease status and treatment options and is in agreement with the current care plan.  All questions were answered. The patient knows to call the clinic with any problems, questions or concerns. We can certainly see the patient much sooner if necessary.  Disclaimer: This note was dictated with voice recognition software. Similar sounding words can inadvertently be transcribed and may be missed upon review.

## 2015-01-31 MED ORDER — HYDROCODONE-ACETAMINOPHEN 7.5-325 MG PO TABS: 1.0000 | ORAL_TABLET | Freq: Four times a day (QID) | ORAL | Status: DC | PRN

## 2015-01-31 NOTE — Telephone Encounter (Signed)
Pt aware.

## 2015-02-27 ENCOUNTER — Other Ambulatory Visit: Payer: Self-pay | Admitting: Internal Medicine

## 2015-02-27 DIAGNOSIS — M25552 Pain in left hip: Principal | ICD-10-CM

## 2015-02-27 DIAGNOSIS — M25551 Pain in right hip: Secondary | ICD-10-CM

## 2015-02-27 NOTE — Telephone Encounter (Signed)
Patient requesting a Hydrocodone Refill.

## 2015-02-27 NOTE — Telephone Encounter (Signed)
Last refill 8/17 per pharmacy Last visit 7/14

## 2015-02-28 MED ORDER — HYDROCODONE-ACETAMINOPHEN 7.5-325 MG PO TABS: 1.0000 | ORAL_TABLET | Freq: Four times a day (QID) | ORAL | Status: DC | PRN

## 2015-03-01 NOTE — Telephone Encounter (Signed)
Pt informed.Despina Hidden Cassady9/14/20169:37 AM

## 2015-03-02 ENCOUNTER — Ambulatory Visit (INDEPENDENT_AMBULATORY_CARE_PROVIDER_SITE_OTHER): Payer: Commercial Managed Care - HMO | Admitting: Internal Medicine

## 2015-03-02 ENCOUNTER — Encounter: Payer: Self-pay | Admitting: Internal Medicine

## 2015-03-02 VITALS — BP 130/81 | HR 76 | Temp 98.4°F | Ht 71.0 in | Wt 197.3 lb

## 2015-03-02 DIAGNOSIS — R319 Hematuria, unspecified: Secondary | ICD-10-CM | POA: Diagnosis not present

## 2015-03-02 DIAGNOSIS — R310 Gross hematuria: Secondary | ICD-10-CM | POA: Diagnosis not present

## 2015-03-02 DIAGNOSIS — N3001 Acute cystitis with hematuria: Secondary | ICD-10-CM

## 2015-03-02 DIAGNOSIS — N390 Urinary tract infection, site not specified: Secondary | ICD-10-CM | POA: Insufficient documentation

## 2015-03-02 DIAGNOSIS — B9689 Other specified bacterial agents as the cause of diseases classified elsewhere: Secondary | ICD-10-CM

## 2015-03-02 LAB — URINALYSIS, ROUTINE W REFLEX MICROSCOPIC
BILIRUBIN URINE: NEGATIVE
Glucose, UA: NEGATIVE mg/dL
Ketones, ur: 15 mg/dL — AB
NITRITE: NEGATIVE
PROTEIN: 100 mg/dL — AB
SPECIFIC GRAVITY, URINE: 1.015 (ref 1.005–1.030)
UROBILINOGEN UA: 1 mg/dL (ref 0.0–1.0)
pH: 8.5 — ABNORMAL HIGH (ref 5.0–8.0)

## 2015-03-02 LAB — CBC
HEMATOCRIT: 42.8 % (ref 39.0–52.0)
HEMOGLOBIN: 13.9 g/dL (ref 13.0–17.0)
MCH: 31.2 pg (ref 26.0–34.0)
MCHC: 32.5 g/dL (ref 30.0–36.0)
MCV: 96.2 fL (ref 78.0–100.0)
Platelets: 239 10*3/uL (ref 150–400)
RBC: 4.45 MIL/uL (ref 4.22–5.81)
RDW: 14.6 % (ref 11.5–15.5)
WBC: 9.1 10*3/uL (ref 4.0–10.5)

## 2015-03-02 LAB — URINE MICROSCOPIC-ADD ON

## 2015-03-02 LAB — BASIC METABOLIC PANEL
ANION GAP: 9 (ref 5–15)
BUN: 33 mg/dL — ABNORMAL HIGH (ref 6–20)
CALCIUM: 9.6 mg/dL (ref 8.9–10.3)
CHLORIDE: 106 mmol/L (ref 101–111)
CO2: 25 mmol/L (ref 22–32)
Creatinine, Ser: 1.77 mg/dL — ABNORMAL HIGH (ref 0.61–1.24)
GFR calc non Af Amer: 37 mL/min — ABNORMAL LOW (ref >60)
GFR, EST AFRICAN AMERICAN: 43 mL/min — AB (ref >60)
Glucose, Bld: 112 mg/dL — ABNORMAL HIGH (ref 65–99)
Potassium: 4 mmol/L (ref 3.5–5.1)
Sodium: 140 mmol/L (ref 135–145)

## 2015-03-02 MED ORDER — IBUPROFEN 400 MG PO TABS: 400.0000 mg | ORAL_TABLET | Freq: Three times a day (TID) | ORAL | Status: DC | PRN

## 2015-03-02 MED ORDER — LEVOFLOXACIN 750 MG PO TABS: 750.0000 mg | ORAL_TABLET | Freq: Every day | ORAL | Status: AC

## 2015-03-02 NOTE — Patient Instructions (Signed)
We have scheduled an appointment with urology for you tomorrow at 12 pm.  I will send antibiotics to your pharmacy. Please pick these up and take them as prescribed.

## 2015-03-02 NOTE — Progress Notes (Signed)
69 year old man with history of hypertension, GERD and carcinoma lung, comes in with trauma to the groin 4 days back and resultant dysuria and gross hematuria. He has no flank or abdominal pain, no fever, nausea or vomiting symptoms. On exam - abdomen is benign and genitalia non-tender, no bruising or swelling or tears. Urine is dark red in color. Patient does not appear to be retaining urine.   BMP, CBC, dipstick and UA, urine culture -- Dipstick positive for nitrites and leucs   Urgent urology referral. Case seen with Dr Sherrye Payor. Plan discussed.  Madilyn Fireman MD MPH 03/02/2015 11:31 AM

## 2015-03-02 NOTE — Progress Notes (Signed)
Patient ID: LONN IM, male   DOB: [birth year], 69 y.o.   MRN: [id number]   Subjective:   Patient ID: JAIVIAN BATTAGLINI male   DOB: 26-Aug-1945 69 y.o.   MRN: [id number]  HPI: Mr.Draydon L Tornow is a 69 y.o. male with PMH of Stage Ib non-small cell lung carcinoma s/p right upper lobectomy, HTN, Gout, Barrett's esophagus who presents with 4 days history of hematuria after apparent injury to groin.  Please see problem list for details.  Past Medical History  Diagnosis Date  . Diverticulosis of colon   . GERD (gastroesophageal reflux disease)   . Gout   . Hypertension   . Hyperlipidemia   . Renal failure, acute     hx of 2/2 medication, resolved  . Barretts esophagus     bx distal esophagus 2011 neg   . Tobacco abuse   . Alcohol abuse   . Hx of adenomatous colonic polyps   . CHF (congestive heart failure)   . Hiatal hernia     2 cm noted on upper endos 2011   . Cataract   . Internal hemorrhoid   . DJD (degenerative joint disease)     left hip  . Myocardial infarction     hx of, 2 stents 2004. pt was on Plavix x 3 years then transitioned to ASA  . Cataract   . Cholelithiasis   . UTI (lower urinary tract infection)   . AAA (abdominal aortic aneurysm)     noted CT 11/2012 size 3.3 x 3.2 cm  . 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 allopurinol (ZYLOPRIM) 100 MG tablet Take 1 tablet (100 mg total) by mouth daily. 90 tablet 3  . amLODipine (NORVASC) 5 MG tablet Take 1 tablet (5 mg total) by mouth daily. 90 tablet 3  . aspirin EC 81 MG tablet Take 81 mg by mouth daily.    . furosemide (LASIX) 20 MG tablet TAKE 1 TABLET (20 MG TOTAL) BY MOUTH DAILY. 90 tablet 3  . HYDROcodone-acetaminophen (NORCO) 7.5-325 MG per tablet Take 1 tablet by mouth every 6 (six) hours as needed for severe pain. 120 tablet 0  . metoprolol tartrate (LOPRESSOR) 25 MG tablet TAKE 1 TABLET TWICE  DAILY 180 tablet 6  . Multiple Vitamin (MULTIVITAMIN) tablet Take 1 tablet by mouth daily.    Marland Kitchen omeprazole (PRILOSEC) 20 MG capsule TAKE 1 CAPSULE TWICE DAILY 180 capsule 3  . rosuvastatin (CRESTOR) 20 MG tablet Take 1 tablet (20 mg total) by mouth daily. 90 tablet 3  . colchicine (COLCRYS) 0.6 MG tablet Take 1 tablet twice daily as needed. (Patient not taking: Reported on 01/30/2015) 30 tablet 0  . ibuprofen (ADVIL,MOTRIN) 400 MG tablet Take 1 tablet (400 mg total) by mouth every 8 (eight) hours as needed. 15 tablet 0   No current facility-administered medications for this visit.   Family History  Problem Relation Age of Onset  . Diabetes Mother   . Hypertension Sister   . Heart disease Sister   . Diabetes Sister   . Arthritis Brother   . Gout Brother   . Colon cancer Neg Hx   . Esophageal cancer Neg Hx   . Rectal cancer Neg Hx   . Stomach cancer Neg Hx    Social History   Social History  . Marital Status: Single    Spouse Name: N/A  . Number of  Children: N/A  . Years of Education: N/A   Social History Main Topics  . Smoking status: Current Every Day Smoker -- 0.50 packs/day    Types: Cigarettes  . Smokeless tobacco: Never Used     Comment: 1/2 PPD  . Alcohol Use: 0.0 oz/week    0 Standard drinks or equivalent per week     Comment: drinking 1 pt gin qweek, (mixed with tap water not tonic)  . Drug Use: No  . Sexual Activity: Not Asked   Other Topics Concern  . None   Social History Narrative   Review of Systems: Review of Systems  Constitutional: Negative for fever, chills and diaphoresis.  Respiratory: Negative for cough and hemoptysis.   Cardiovascular: Negative for chest pain.  Gastrointestinal: Positive for constipation and melena. Negative for nausea, vomiting, abdominal pain and blood in stool.  Genitourinary: Positive for dysuria, urgency, frequency and hematuria. Negative for flank pain.  Skin: Negative for itching and rash.  Neurological: Negative for  dizziness.    Objective:  Physical Exam: Filed Vitals:   03/02/15 0904  BP: 130/81  Pulse: 76  Temp: 98.4 F (36.9 C)  TempSrc: Oral  Height: '5\' 11"'$  (1.803 m)  Weight: 197 lb 4.8 oz (89.495 kg)  SpO2: 93%   Physical Exam  Constitutional: He appears well-developed and well-nourished.  HENT:  Head: Normocephalic and atraumatic.  Cardiovascular: Normal rate and regular rhythm.   Pulmonary/Chest: Effort normal and breath sounds normal. No respiratory distress. He has no wheezes.  Abdominal: Soft. He exhibits no distension. There is no tenderness. There is no guarding.  Genitourinary: Testes normal. Circumcised. Hypospadias present. No penile erythema. No discharge found.  No laceration or bruising.  Skin: Skin is warm. No rash noted.  Psychiatric: He has a normal mood and affect.    Assessment & Plan:  Please see problem based charting.

## 2015-03-02 NOTE — Assessment & Plan Note (Addendum)
Patient complains of 4 days history of gross hematuria after hitting his groin on the corner of his kitchen counter. He complains of burning sensation, dysuria/difficulty initiating, urgency. During clinic visit today, patient had to urgently use the bathroom twice. He denies any fever, chills, N/V, chest pain, flank pain, lightheadedness, or dizziness. Urine is dark red/amber in appearance.   On physical exam, there is no obvious bruising, laceration, swelling, tenderness to palpation, or sign of trauma. Patient does have hypospadias. Urine dipstick positive for leukocytes and nitrites.  Patient has UTI, possibly a complication from his hypospadias. His hematuria may be due to trauma. Patient had previous cystoscopy for gross hematuria in 2007 which showed a clot within the bladder, which may be the case during this episode as well.  -UA, urine culture -CBC -> Hgb 13.9 -BMP -Urology appointment scheduled for tomorrow -Levofloxacin 750 mg po daily for 7 days

## 2015-03-02 NOTE — Assessment & Plan Note (Signed)
Patient with gross hematuria, dysuria/difficulting starting flow, burning sensation, urgency with urine dipstick positive for nitrites and leukocytes. No fevers, chills, abdominal pain, flank pain, nausea or vomiting.  -UA, urine culture -Levofloxacin 750 mg po daily for 7 days -Urology referral

## 2015-03-02 NOTE — Addendum Note (Signed)
Addended by: Zada Finders R on: 03/02/2015 01:30 PM   Modules accepted: Orders

## 2015-03-03 DIAGNOSIS — N390 Urinary tract infection, site not specified: Secondary | ICD-10-CM | POA: Diagnosis not present

## 2015-03-03 DIAGNOSIS — B964 Proteus (mirabilis) (morganii) as the cause of diseases classified elsewhere: Secondary | ICD-10-CM | POA: Diagnosis not present

## 2015-03-03 DIAGNOSIS — R310 Gross hematuria: Secondary | ICD-10-CM | POA: Diagnosis not present

## 2015-03-03 DIAGNOSIS — R350 Frequency of micturition: Secondary | ICD-10-CM | POA: Diagnosis not present

## 2015-03-05 LAB — URINE CULTURE

## 2015-03-07 ENCOUNTER — Ambulatory Visit (INDEPENDENT_AMBULATORY_CARE_PROVIDER_SITE_OTHER): Payer: Commercial Managed Care - HMO | Admitting: Internal Medicine

## 2015-03-07 ENCOUNTER — Encounter: Payer: Self-pay | Admitting: Internal Medicine

## 2015-03-07 VITALS — BP 80/54 | HR 60 | Ht 71.75 in | Wt 195.2 lb

## 2015-03-07 DIAGNOSIS — K5900 Constipation, unspecified: Secondary | ICD-10-CM

## 2015-03-07 DIAGNOSIS — K2270 Barrett's esophagus without dysplasia: Secondary | ICD-10-CM | POA: Diagnosis not present

## 2015-03-07 MED ORDER — POLYETHYLENE GLYCOL 3350 17 GM/SCOOP PO POWD: 17.0000 g | Freq: Every day | ORAL | Status: DC

## 2015-03-07 NOTE — Patient Instructions (Addendum)
You have been given a separate informational sheet regarding your tobacco use, the importance of quitting and local resources to help you quit.   Dr [name] recommends that you complete a bowel purge (to clean out your bowels). Please do the following: Purchase a bottle of Miralax over the counter as well as a box of 5 mg dulcolax tablets. Take 4 dulcolax tablets. Wait 1 hour. You will then drink 6-8 capfuls of Miralax mixed in an adequate amount of water/juice/gatorade (you may choose which of these liquids to drink) over the next 2-3 hours. You should expect results within 1 to 6 hours after completing the bowel purge.  After the purge use Miralax as needed for constipation.  You have been scheduled for an endoscopy. Please follow written instructions given to you at your visit today. If you use inhalers (even only as needed), please bring them with you on the day of your procedure.   I appreciate the opportunity to care for you. Silvano Rusk, MD, [facility]

## 2015-03-07 NOTE — Progress Notes (Signed)
  Referred by Madilyn Fireman, MD   Subjective:    Patient ID: [name], male    DOB: 12-18-1945, 69 y.o.   MRN: [id number] Cc: Barrett's, constipation HPI Here re: Barrett's f/u - SSBE dx 2008 then not there 2011 - has not had repeat exam yet. No heartburn or dysphagia - is taking PPI  Also not having effective defecation x 1 week  No new meds except quinolones - Abx for UTI - PCP and McDiarmid  Has small stools difficult defecation w/o bleeding x 1 week  Mild right groin pain but struckdrawer in kitchen also Medications, allergies, past medical history, past surgical history, family history and social history are reviewed and updated in the EMR.  Review of Systems Joint pains, + hematuria, dyspnea All other ROS neg    Objective:   Physical Exam '@BP'$  80/54 mmHg  Pulse 60  Ht 5' 11.75" (1.822 m)  Wt 195 lb 4 oz (88.565 kg)  BMI 26.68 kg/m2@  General:  Well-developed, well-nourished and in no acute distress Eyes:  Anicteric. Muddy sclerae ENT:   Mouth and posterior pharynx free of lesions. edentulous Lungs: Clear to auscultation bilaterally. Heart:  S1S2, no rubs, murmurs, gallops. Abdomen:  soft, non-tender, no hepatosplenomegaly, hernia, or mass and BS+.  Extremities:   no edema, cyanosis or clubbing Skin   no rash. Neuro:  A&O x 3.  Psych:  appropriate mood and  Affect.   Data Reviewed: Prior EGD's, pathology Last colonoscopy 2013 - polyps PCP notes July and Sept 2016 Labs 02/2015     Assessment & Plan:  Barrett's esophagus  EGD to f/u Barrett's - The risks and benefits as well as alternatives of endoscopic procedure(s) have been discussed and reviewed. All questions answered. The patient agrees to proceed.   Acute constipation  MiraLax purge then MiraLax prn

## 2015-03-09 DIAGNOSIS — R312 Other microscopic hematuria: Secondary | ICD-10-CM | POA: Diagnosis not present

## 2015-03-09 DIAGNOSIS — R310 Gross hematuria: Secondary | ICD-10-CM | POA: Diagnosis not present

## 2015-03-20 ENCOUNTER — Encounter: Payer: Self-pay | Admitting: *Deleted

## 2015-03-20 DIAGNOSIS — R350 Frequency of micturition: Secondary | ICD-10-CM | POA: Diagnosis not present

## 2015-03-20 DIAGNOSIS — R351 Nocturia: Secondary | ICD-10-CM | POA: Diagnosis not present

## 2015-03-29 ENCOUNTER — Encounter: Payer: Self-pay | Admitting: Internal Medicine

## 2015-03-29 ENCOUNTER — Other Ambulatory Visit: Payer: Self-pay | Admitting: Student in an Organized Health Care Education/Training Program

## 2015-03-29 ENCOUNTER — Ambulatory Visit (AMBULATORY_SURGERY_CENTER): Payer: Commercial Managed Care - HMO | Admitting: Internal Medicine

## 2015-03-29 VITALS — BP 161/99 | HR 75 | Temp 97.6°F | Resp 20 | Ht 71.0 in | Wt 195.0 lb

## 2015-03-29 DIAGNOSIS — M25552 Pain in left hip: Principal | ICD-10-CM

## 2015-03-29 DIAGNOSIS — K2270 Barrett's esophagus without dysplasia: Secondary | ICD-10-CM | POA: Diagnosis present

## 2015-03-29 DIAGNOSIS — K208 Other esophagitis: Secondary | ICD-10-CM | POA: Diagnosis not present

## 2015-03-29 DIAGNOSIS — M25551 Pain in right hip: Secondary | ICD-10-CM

## 2015-03-29 MED ORDER — SODIUM CHLORIDE 0.9 % IV SOLN: 500.0000 mL | INTRAVENOUS | Status: DC

## 2015-03-29 NOTE — Telephone Encounter (Signed)
Pt called requesting hydrocodone to be filled.

## 2015-03-29 NOTE — Progress Notes (Signed)
Called to room to assist during endoscopic procedure.  Patient ID and intended procedure confirmed with present staff. Received instructions for my participation in the procedure from the performing physician.  

## 2015-03-29 NOTE — Patient Instructions (Addendum)
I did not see any problems other than the known lining changes called Barrett's esophagus. I took biopsies and will let you know.  I appreciate the opportunity to care for you. [name], MD, FACG   YOU HAD AN ENDOSCOPIC PROCEDURE TODAY AT [facility]:   Refer to the procedure report that was given to you for any specific questions about what was found during the examination.  If the procedure report does not answer your questions, please call your gastroenterologist to clarify.  If you requested that your care partner not be given the details of your procedure findings, then the procedure report has been included in a sealed envelope for you to review at your convenience later.  YOU SHOULD EXPECT: Some feelings of bloating in the abdomen. Passage of more gas than usual.  Walking can help get rid of the air that was put into your GI tract during the procedure and reduce the bloating. If you had a lower endoscopy (such as a colonoscopy or flexible sigmoidoscopy) you may notice spotting of blood in your stool or on the toilet paper. If you underwent a bowel prep for your procedure, you may not have a normal bowel movement for a few days.  Please Note:  You might notice some irritation and congestion in your nose or some drainage.  This is from the oxygen used during your procedure.  There is no need for concern and it should clear up in a day or so.  SYMPTOMS TO REPORT IMMEDIATELY:     Following upper endoscopy (EGD)  Vomiting of blood or coffee ground material  New chest pain or pain under the shoulder blades  Painful or persistently difficult swallowing  New shortness of breath  Fever of 100F or higher  Black, tarry-looking stools  For urgent or emergent issues, a gastroenterologist can be reached at any hour by calling [phone].   DIET: Your first meal following the procedure should be a small meal and then it is ok to progress to your normal diet.  Heavy or fried foods are harder to digest and may make you feel nauseous or bloated.  Likewise, meals heavy in dairy and vegetables can increase bloating.  Drink plenty of fluids but you should avoid alcoholic beverages for 24 hours.  ACTIVITY:  You should plan to take it easy for the rest of today and you should NOT DRIVE or use heavy machinery until tomorrow (because of the sedation medicines used during the test).    FOLLOW UP: Our staff will call the number listed on your records the next business day following your procedure to check on you and address any questions or concerns that you may have regarding the information given to you following your procedure. If we do not reach you, we will leave a message.  However, if you are feeling well and you are not experiencing any problems, there is no need to return our call.  We will assume that you have returned to your regular daily activities without incident.  If any biopsies were taken you will be contacted by phone or by letter within the next 1-3 weeks.  Please call us at [phone] if you have not heard about the biopsies in 3 weeks.    SIGNATURES/CONFIDENTIALITY: You and/or your care partner have signed paperwork which will be entered into your electronic medical record.  These signatures attest to the fact that that the information above on your After Visit Summary has  been reviewed and is understood.  Full responsibility of the confidentiality of this discharge information lies with you and/or your care-partner.   Await biopsy results  Information on GERD and hiatal hernia given to you today  Continue ant reflux medication (PPI)

## 2015-03-29 NOTE — Telephone Encounter (Signed)
Next appt November 2016 dr vincent Last filled in clinic 9/13 Last uds 2014

## 2015-03-29 NOTE — Op Note (Signed)
[place]  [employer] [place], [address]   ENDOSCOPY PROCEDURE REPORT  PATIENT: [name], [name]  MR#: [id number] BIRTHDATE: 1945-09-17 , 69  yrs. old GENDER: male ENDOSCOPIST: [name], MD, [facility] PROCEDURE DATE:  03/29/2015 PROCEDURE:  EGD w/ biopsy ASA CLASS:     Class II INDICATIONS:  surveillance.  Barrett's MEDICATIONS: Propofol 160 mg IV and Monitored anesthesia care TOPICAL ANESTHETIC: none  DESCRIPTION OF PROCEDURE: After the risks benefits and alternatives of the procedure were thoroughly explained, informed consent was obtained.  The LB [id number] endoscope was introduced through the mouth and advanced to the second portion of the duodenum , Without limitations.  The instrument was slowly withdrawn as the mucosa was fully examined.    1) Two small 2-3 mm tongues of columnar mucosa above Z-line - prior barrett's - biopsies taken. 2) Very small sliding hiatal hernia. 3) non-erosive antral gastropathy 4) Otherwise normal.  Retroflexed views revealed no abnormalities.     The scope was then withdrawn from the patient and the procedure completed.  COMPLICATIONS: There were no immediate complications.  ENDOSCOPIC IMPRESSION: 1) Two small 2-3 mm tongues of columnar mucosa above Z-line - prior barrett's - biopsies taken. 2) Very small sliding hiatal hernia. 3) non-erosive antral gastropathy 4) Otherwise normal  RECOMMENDATIONS: 1.  Await pathology results 2.  Office will call with results 3.  Continue PPI   eSigned:  [name], MD, [facility] 03/29/2015 4:19 PM    CC:The Patient

## 2015-03-30 ENCOUNTER — Telehealth: Payer: Self-pay | Admitting: *Deleted

## 2015-03-30 MED ORDER — HYDROCODONE-ACETAMINOPHEN 7.5-325 MG PO TABS: 1.0000 | ORAL_TABLET | Freq: Four times a day (QID) | ORAL | Status: DC | PRN

## 2015-03-30 NOTE — Telephone Encounter (Signed)
  Follow up Call-  Call back number 03/29/2015  Post procedure Call Back phone  # [phone]  Permission to leave phone message No     Patient questions:  Do you have a fever, pain , or abdominal swelling? No. Pain Score  0 *  Have you tolerated food without any problems? Yes.    Have you been able to return to your normal activities? Yes.    Do you have any questions about your discharge instructions: Diet   No. Medications  No. Follow up visit  No.  Do you have questions or concerns about your Care? No.  Actions: * If pain score is 4 or above: No action needed, pain <4.

## 2015-03-30 NOTE — Telephone Encounter (Signed)
Pt informed

## 2015-03-30 NOTE — Telephone Encounter (Signed)
appt 11/14 dr Evette Doffing

## 2015-04-06 ENCOUNTER — Encounter: Payer: Self-pay | Admitting: Internal Medicine

## 2015-04-06 NOTE — Progress Notes (Signed)
Quick Note:  No intestinal metaplasia = no Barrett's twice now so no recall ______

## 2015-04-13 ENCOUNTER — Other Ambulatory Visit: Payer: Self-pay | Admitting: Internal Medicine

## 2015-04-26 IMAGING — CT CT ABD-PELV W/ CM
1 of 3 series · 13 of 32 positions shown, 18 images · IV contrast (omnipaque)
Comparison: CT of the abdomen and pelvis 01/29/2010.

CLINICAL DATA: Abdominal pain.

CT ABDOMEN AND PELVIS WITH CONTRAST
TECHNIQUE: Multidetector CT imaging of the abdomen and pelvis was
performed following the standard protocol during bolus
administration of intravenous contrast.
Contrast: 100mL OMNIPAQUE IOHEXOL 300 MG/ML  SOLN

[Series 2: abd/pelv with 5.0 b31f st · axial · 0.73mm/px · z∈[+864,+1339]mm · 13 of 107 slices shown, 18 images]
[im 6/107  soft-tissue]
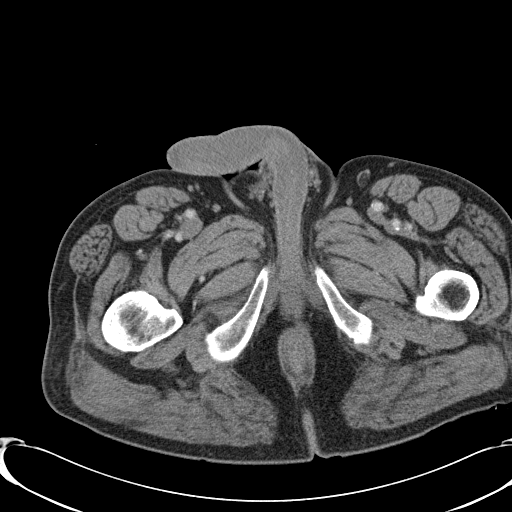
[im 6/107  bone]
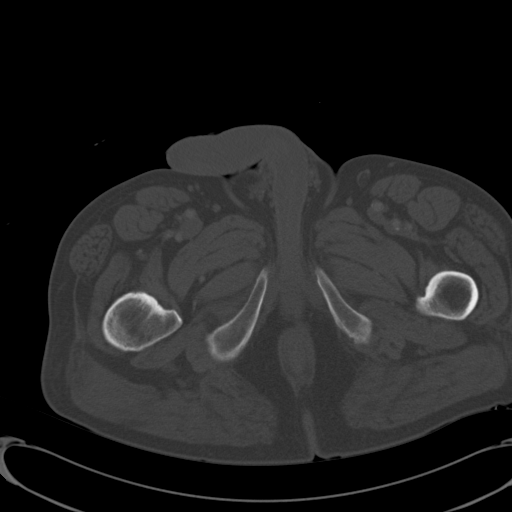
[im 17/107  soft-tissue]
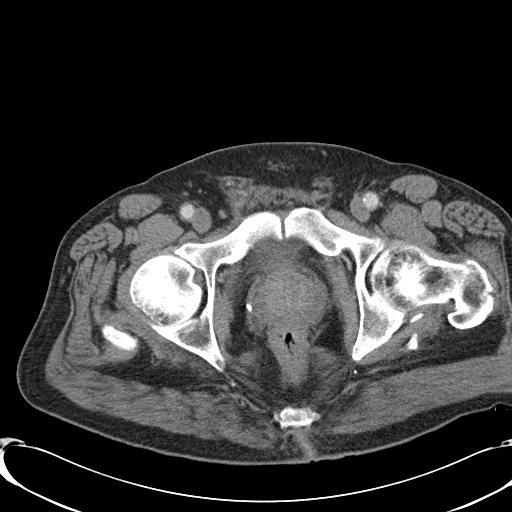
[im 23/107  soft-tissue]
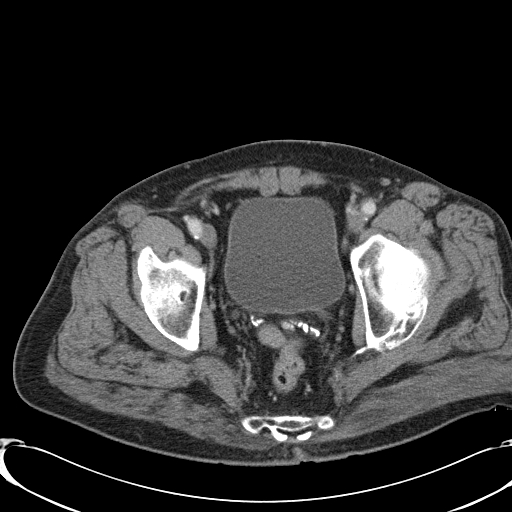
[im 34/107  soft-tissue]
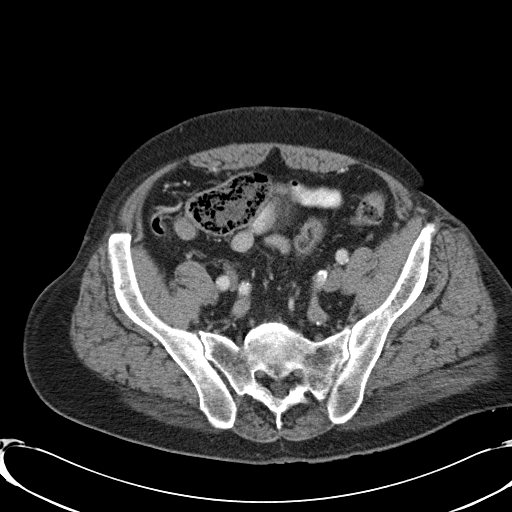
[im 40/107  soft-tissue]
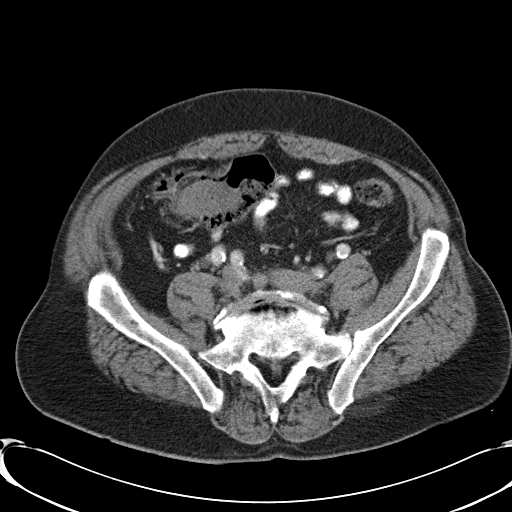
[im 51/107  soft-tissue]
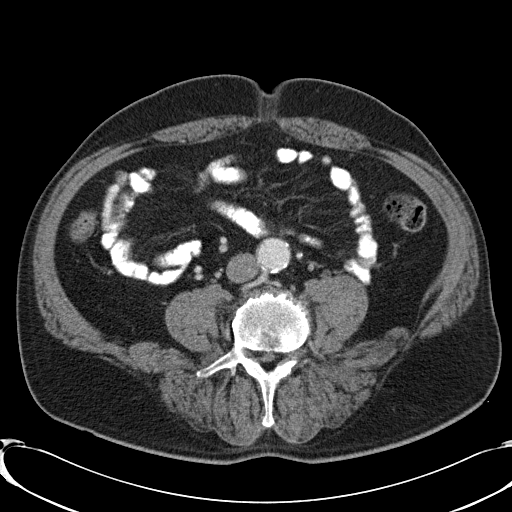
[im 56/107  soft-tissue]
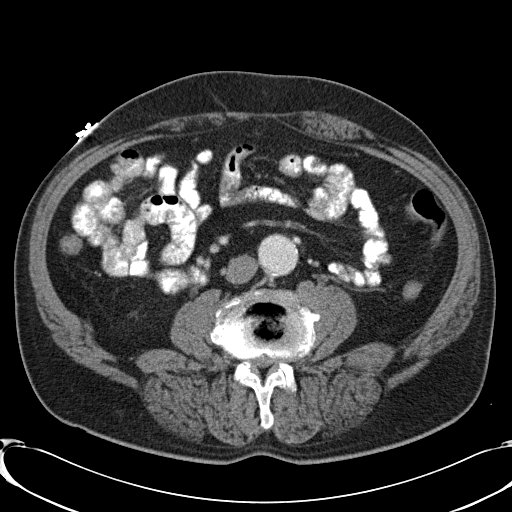
[im 67/107  soft-tissue]
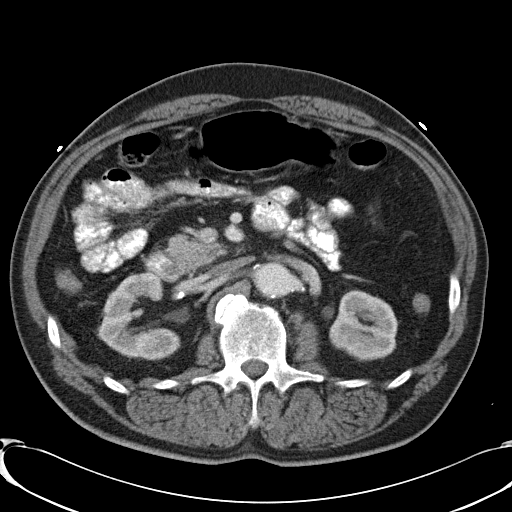
[im 73/107  soft-tissue]
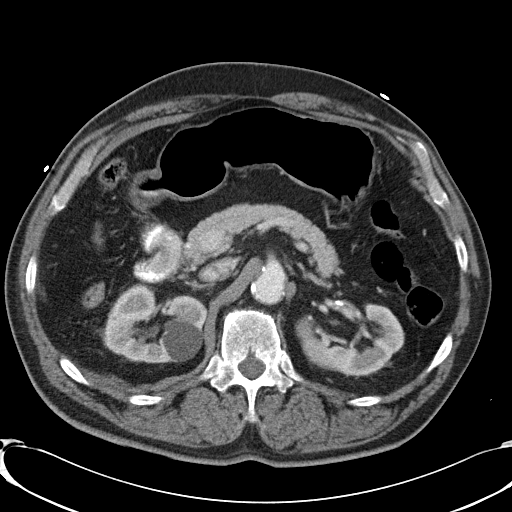
[im 73/107  bone]
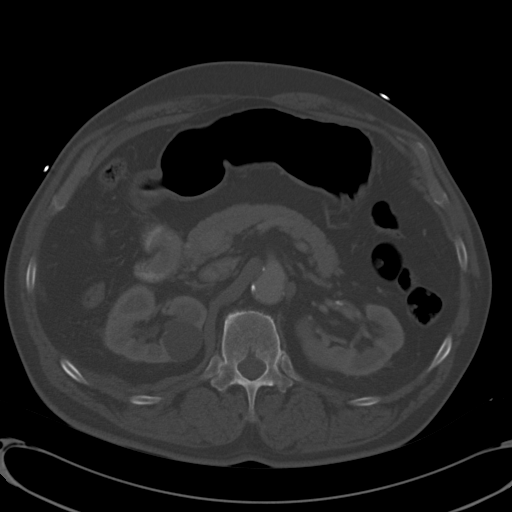
[im 84/107  soft-tissue]
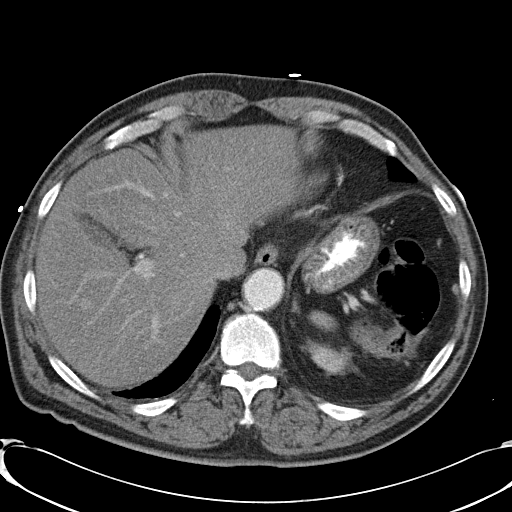
[im 84/107  lung]
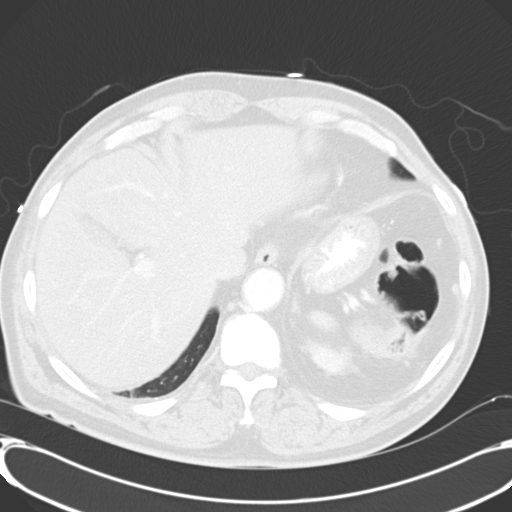
[im 90/107  soft-tissue]
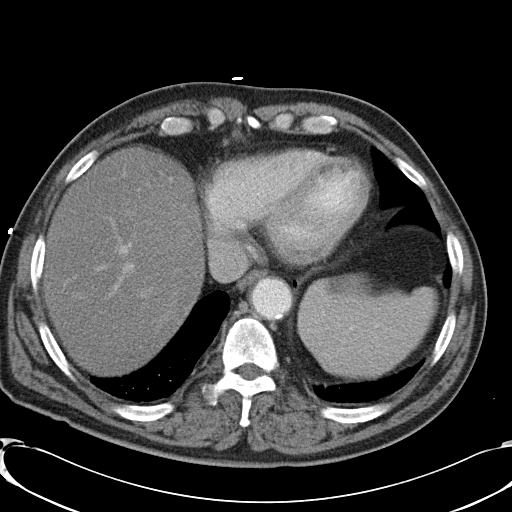
[im 90/107  lung]
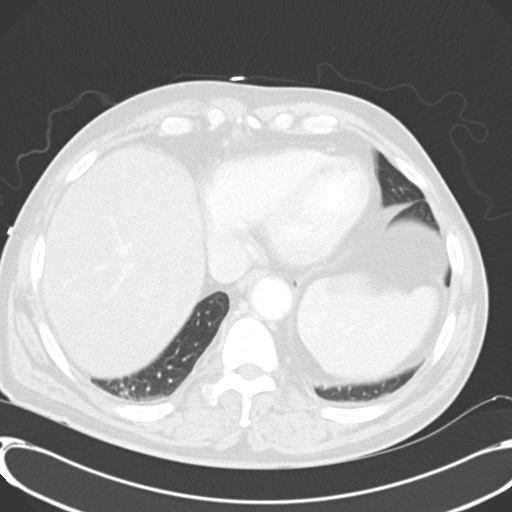
[im 95/107  lung]
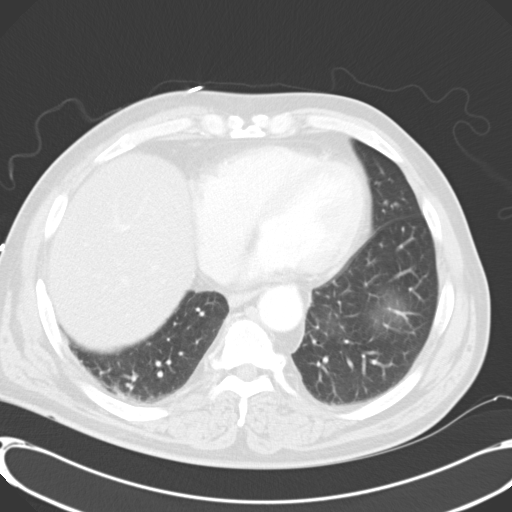
[im 101/107  soft-tissue]
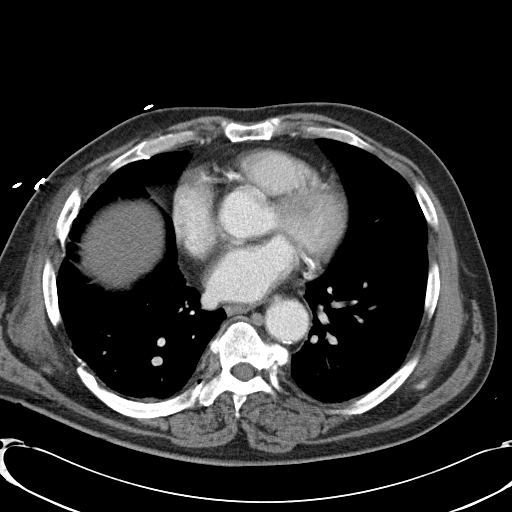
[im 101/107  lung]
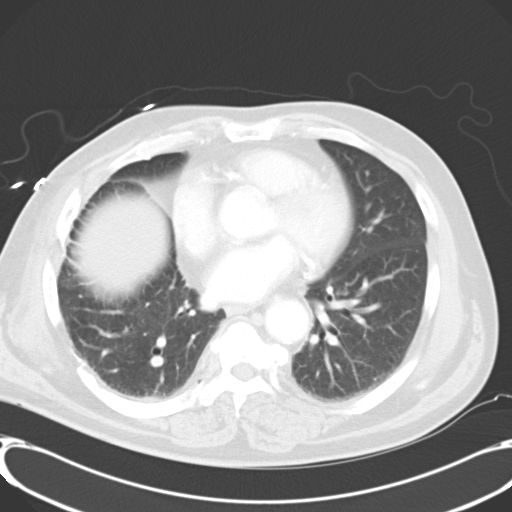

[13 of 32 positions shown; findings below may reference images not displayed]

FINDINGS: Lung Bases:  Atherosclerotic calcifications in the left main, left
anterior descending, circumflex and right coronary arteries.
Minimal scarring in the right base.  Fibrofatty metaplasia in the
inferolateral wall of the left ventricle near the base and in the
left ventricular apex related to prior myocardial infarctions.

Abdomen/Pelvis:  1.8 cm calcified gallstone in the neck of the
gallbladder.  Gallbladder does not appear distended, does not have
a thickened wall, and it is not surrounded by pericholecystic fluid
or stranding to suggest an acute cholecystitis at this time.  The
appearance of the liver, pancreas, spleen and bilateral adrenal
glands is unremarkable.  Again noted is a 2 cm exophytic
intermediate to high attenuation lesion in the posterior aspect of
the upper pole of the left kidney which appears very similar to the
prior study from 3077 at which point this was characterized as a
proteinaceous cyst.  Multiple low attenuation lesions in the right
kidney are similar to the prior study, largest of which measures up
to 2.9 cm in the medial aspect of the interpolar region, and is
compatible with a simple cyst.  The smaller right renal lesions are
too small to definitively characterize, but are also favored to
represent small cysts.

Atherosclerosis throughout the abdominal and pelvic vasculature,
including mild fusiform infrarenal abdominal aortic aneurysm
measuring 3.3 x 3.2 cm.  No significant volume of ascites.  No
pneumoperitoneum.  No pathologic distension of small bowel.  Normal
appendix.  No definite pathologic lymphadenopathy identified within
the abdomen or pelvis on today's CT examination.  The prostate
gland appears enlarged measuring 5.9 x 4.2 cm, and there is median
lobe hypertrophy.  Urinary bladder is unremarkable in appearance.

Musculoskeletal: There are no aggressive appearing lytic or blastic
lesions noted in the visualized portions of the skeleton.
IMPRESSION: 1.  No acute findings in the abdomen or pelvis to account for the
patient's symptoms.
2.  Cholelithiasis without evidence to suggest acute cholecystitis
at this time.

3.  Normal appendix.
4.  Extensive atherosclerosis, including left main and three vessel
coronary artery disease, as well as a small fusiform infrarenal
abdominal aortic aneurysm measuring 3.3 x 3.2 cm.
5.  Enlarged prostate gland, as above.
6.  Renal lesions bilaterally, similar to the prior examination,
which appear to represent a combination of simple cysts and a
proteinaceous cyst in the upper pole of the left kidney.
7.  Additional incidental findings, as above.

## 2015-05-01 ENCOUNTER — Ambulatory Visit (INDEPENDENT_AMBULATORY_CARE_PROVIDER_SITE_OTHER): Payer: Commercial Managed Care - HMO | Admitting: Student in an Organized Health Care Education/Training Program

## 2015-05-01 ENCOUNTER — Encounter: Payer: Self-pay | Admitting: Student in an Organized Health Care Education/Training Program

## 2015-05-01 VITALS — BP 111/59 | HR 71 | Temp 97.7°F | Ht 71.9 in | Wt 199.7 lb

## 2015-05-01 DIAGNOSIS — I714 Abdominal aortic aneurysm, without rupture, unspecified: Secondary | ICD-10-CM

## 2015-05-01 DIAGNOSIS — F1120 Opioid dependence, uncomplicated: Secondary | ICD-10-CM

## 2015-05-01 DIAGNOSIS — M1610 Unilateral primary osteoarthritis, unspecified hip: Secondary | ICD-10-CM

## 2015-05-01 DIAGNOSIS — R739 Hyperglycemia, unspecified: Secondary | ICD-10-CM

## 2015-05-01 DIAGNOSIS — F17200 Nicotine dependence, unspecified, uncomplicated: Secondary | ICD-10-CM

## 2015-05-01 DIAGNOSIS — K2270 Barrett's esophagus without dysplasia: Secondary | ICD-10-CM

## 2015-05-01 DIAGNOSIS — Z23 Encounter for immunization: Secondary | ICD-10-CM

## 2015-05-01 DIAGNOSIS — M25551 Pain in right hip: Secondary | ICD-10-CM

## 2015-05-01 DIAGNOSIS — Z79891 Long term (current) use of opiate analgesic: Secondary | ICD-10-CM | POA: Insufficient documentation

## 2015-05-01 DIAGNOSIS — M25552 Pain in left hip: Secondary | ICD-10-CM

## 2015-05-01 DIAGNOSIS — I10 Essential (primary) hypertension: Secondary | ICD-10-CM | POA: Diagnosis not present

## 2015-05-01 DIAGNOSIS — I700 Atherosclerosis of aorta: Secondary | ICD-10-CM

## 2015-05-01 DIAGNOSIS — F1920 Other psychoactive substance dependence, uncomplicated: Secondary | ICD-10-CM

## 2015-05-01 DIAGNOSIS — M129 Arthropathy, unspecified: Secondary | ICD-10-CM

## 2015-05-01 MED ORDER — HYDROCODONE-ACETAMINOPHEN 7.5-325 MG PO TABS: 1.0000 | ORAL_TABLET | Freq: Four times a day (QID) | ORAL | Status: DC | PRN

## 2015-05-01 NOTE — Assessment & Plan Note (Signed)
History of Barrett's esophagus 2008, repeat EGDs 2011 and 03/2015 with biopsies did not show further barretts so no further surveillance per GI. Continue omeprazole.

## 2015-05-01 NOTE — Assessment & Plan Note (Signed)
Diffuse atherosclerosis with CAD and last AMI 2004. Currently doing well on secondary prevention. Continue aspirin, rosuvastatin, and metoprolol. Allergic to ARB.

## 2015-05-01 NOTE — Patient Instructions (Signed)
Tobacco Use Disorder Tobacco use disorder (TUD) is a mental disorder. It is the long-term use of tobacco in spite of related health problems or difficulty with normal life activities. Tobacco is most commonly smoked as cigarettes and less commonly as cigars or pipes. Smokeless chewing tobacco and snuff are also popular. People with TUD get a feeling of extreme pleasure (euphoria) from using tobacco and have a desire to use it again and again. Repeated use of tobacco can cause problems. The addictive effects of tobacco are due mainly tothe ingredient nicotine. Nicotine also causes a rush of adrenaline (epinephrine) in the body. This leads to increased blood pressure, heart rate, and breathing rate. These changes may cause problems for people with high blood pressure, weak hearts, or lung disease. High doses of nicotine in children and pets can lead to seizures and death.  Tobacco contains a number of other unsafe chemicals. These chemicals are especially harmful when inhaled as smoke and can damage almost every organ in the body. Smokers live shorter lives than nonsmokers and are at risk of dying from a number of diseases and cancers. Tobacco smoke can also cause health problems for nonsmokers (due to inhaling secondhand smoke). Smoking is also a fire hazard.  TUD usually starts in the late teenage years and is most common in young adults between the ages of 47 and 31 years. People who start smoking earlier in life are more likely to continue smoking as adults. TUD is somewhat more common in men than women. People with TUD are at higher risk for using alcohol and other drugs of abuse. RISK FACTORS Risk factors for TUD include:   Having family members with the disorder.  Being around people who use tobacco.  Having an existing mental health issue such as schizophrenia, depression, bipolar disorder, ADHD, or posttraumatic stress disorder (PTSD). SIGNS AND SYMPTOMS  People with tobacco use disorder have  two or more of the following signs and symptoms within 12 months:   Use of more tobacco over a longer period than intended.   Not able to cut down or control tobacco use.   A lot of time spent obtaining or using tobacco.   Strong desire or urge to use tobacco (craving). Cravings may last for 6 months or longer after quitting.  Use of tobacco even when use leads to major problems at work, school, or home.   Use of tobacco even when use leads to relationship problems.   Giving up or cutting down on important life activities because of tobacco use.   Repeatedly using tobacco in situations where it puts you or others in physical danger, like smoking in bed.   Use of tobacco even when it is known that a physical or mental problem is likely related to tobacco use.   Physical problems are numerous and may include chronic bronchitis, emphysema, lung and other cancers, gum disease, high blood pressure, heart disease, and stroke.   Mental problems caused by tobacco may include difficulty sleeping and anxiety.  Need to use greater amounts of tobacco to get the same effect. This means you have developed a tolerance.   Withdrawal symptoms as a result of stopping or rapidly cutting back use. These symptoms may last a month or more after quitting and include the following:   Depressed, anxious, or irritable mood.   Difficulty concentrating.   Increased appetite.  Restlessness or trouble sleeping.   Use of tobacco to avoid withdrawal symptoms. DIAGNOSIS  Tobacco use disorder is diagnosed by  your health care provider. A diagnosis may be made by:  Your health care provider asking questions about your tobacco use and any problems it may be causing.  A physical exam.  Lab tests.  You may be referred to a mental health professional or addiction specialist. The severity of tobacco use disorder depends on the number of signs and symptoms you have:   Mild--Two or three  symptoms.  Moderate--Four or five symptoms.   Severe--Six or more symptoms.  TREATMENT  Many people with tobacco use disorder are unable to quit on their own and need help. Treatment options include the following:  Nicotine replacement therapy (NRT). NRT provides nicotine without the other harmful chemicals in tobacco. NRT gradually lowers the dosage of nicotine in the body and reduces withdrawal symptoms. NRT is available in over-the-counter forms (gum, lozenges, and skin patches) as well as prescription forms (mouth inhaler and nasal spray).  Medicines.This may include:  Antidepressant medicine that may reduce nicotine cravings.  A medicine that acts on nicotine receptors in the brain to reduce cravings and withdrawal symptoms. It may also block the effects of tobacco in people with TUD who relapse.  Counseling or talk therapy. A form of talk therapy called behavioral therapy is commonly used to treat people with TUD. Behavioral therapy looks at triggers for tobacco use, how to avoid them, and how to cope with cravings. It is most effective in person or by phone but is also available in self-help forms (books and Internet websites).  Support groups. These provide emotional support, advice, and guidance for quitting tobacco. The most effective treatment for TUD is usually a combination of medicine, talk therapy, and support groups. HOME CARE INSTRUCTIONS  Keep all follow-up visits as directed by your health care provider. This is important.  Take medicines only as directed by your health care provider.  Check with your health care provider before starting new prescription or over-the-counter medicines. SEEK MEDICAL CARE IF:  You are not able to take your medicines as prescribed.  Treatment is not helping your TUD and your symptoms get worse. SEEK IMMEDIATE MEDICAL CARE IF:  You have serious thoughts about hurting yourself or others.  You have trouble breathing, chest pain,  sudden weakness, or sudden numbness in part of your body.   This information is not intended to replace advice given to you by your health care provider. Make sure you discuss any questions you have with your health care provider.   Document Released: 02/07/2004 Document Revised: 06/24/2014 Document Reviewed: 07/30/2013 Elsevier Interactive Patient Education [employer].

## 2015-05-01 NOTE — Assessment & Plan Note (Signed)
Continues to smoke daily. He is pre-contemplative about quiting. Has used NRT in the past. We talked about buproprion or chantix. I spent greater than 3 minutes counseling on the importance of tobacco cessation and we will follow up with him at next visit.

## 2015-05-01 NOTE — Assessment & Plan Note (Signed)
He uses narcotics chronically for pain related to osteoarthritis. Currently doing well with no falls. Advanced age puts him at moderate to high risk for adverse effect, so I will follow him closely. Database reviewed today and shows appropriate prescribers from [employer] UDS obtained today. I refilled Norco 7.5/325 1 tab q6 hours prn pain, #120 for 30 day supply, #3 scripts given for three month supply. Follow up with me within three months for all refills.

## 2015-05-01 NOTE — Assessment & Plan Note (Signed)
A1c July 2016 was 6.1% indicating pre-diabetes. He is working on improving life style and diet. Repeat A1c at least annually.

## 2015-05-01 NOTE — Progress Notes (Signed)
   See Encounters tab for problem-based medical decision making  __________________________________________________________  HPI:  69 year old man presents for follow up of left hip pain. This has been chronic and stable problem since at least 2010. He uses chronic narcotics to manage pain and maintain functionality. Says he is doing well. Pain improves with norco. No side effects. No falls or medication interactions. Pain control is adequate. No new pain generators. Otherwise doing well. No CP, SOB, PND, or DOE. No fevers or chills. He has good exertional capacity, can walk about a mile on flat ground, has dyspnea when walking up hills or more than 2 flights of stairs. Lives at home by himself. Independent in all ADLs. Good compliance with medications. Still smoking about a pack per day.    __________________________________________________________  Problem List: Patient Active Problem List   Diagnosis Date Noted  . Dependency on pain medication ([place]) 05/01/2015  . Atherosclerosis of aorta ([place]) 01/13/2015  . Hyperglycemia 12/30/2014  . AAA (abdominal aortic aneurysm) ([place])   . Healthcare maintenance 06/30/2012  . CAD (coronary artery disease) 03/17/2012  . Erectile dysfunction 10/12/2010  . Chronic kidney disease 08/07/2007  . Carcinoma, lung ([place]) 07/14/2006  . Personal history of colonic polyps 04/18/2006  . Gout 04/18/2006  . TOBACCO ABUSE 04/18/2006  . Essential hypertension 04/18/2006  . GERD 04/18/2006    Medications: Reconciled today in Epic __________________________________________________________  Physical Exam:  Vital Signs: Filed Vitals:   05/01/15 1100  BP: 111/59  Pulse: 71  Temp: 97.7 F (36.5 C)  TempSrc: Oral  Height: 5' 11.9" (1.826 m)  Weight: 199 lb 11.2 oz (90.583 kg)  SpO2: 100%    Gen: Well appearing, NAD ENT: OP clear without erythema or exudate.  Neck: No cervical LAD, No thyromegaly or nodules, No JVD. CV: RRR, no murmurs Pulm: Normal  effort, CTA throughout, no wheezing Abd: Soft, NT, ND, normal BS.  Ext: Warm, no edema, normal joints Skin: No atypical appearing moles. No rashes

## 2015-05-08 LAB — TOXASSURE SELECT,+ANTIDEPR,UR: PDF: 0

## 2015-05-18 ENCOUNTER — Other Ambulatory Visit: Payer: Self-pay | Admitting: Internal Medicine

## 2015-06-01 ENCOUNTER — Encounter: Payer: Commercial Managed Care - HMO | Admitting: Internal Medicine

## 2015-06-14 IMAGING — CT CT CHEST W/O CM
2 of 4 series · 15 of 36 positions shown, 18 images · non-contrast
Comparison: Chest CT 01/27/2012.

CLINICAL DATA: Lung cancer.  Status post right upper lobectomy.
Shortness of breath with exertion.

CT CHEST WITHOUT CONTRAST
TECHNIQUE: Multidetector CT imaging of the chest was performed
following the standard protocol without IV contrast.

[Series 2: chest w/o st · axial · non-contrast · 0.81mm/px · z∈[-348,-73]mm · 12 of 65 slices shown, 15 images]
[im 5/65  mediastinal]
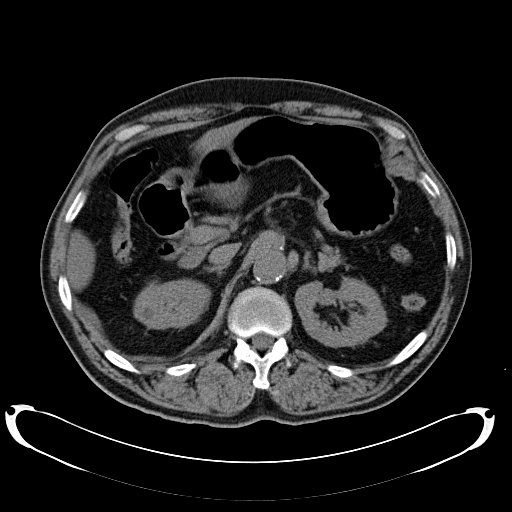
[im 5/65  lung]
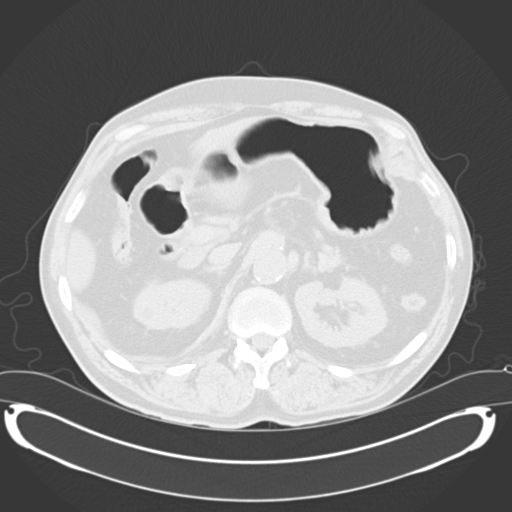
[im 10/65  lung]
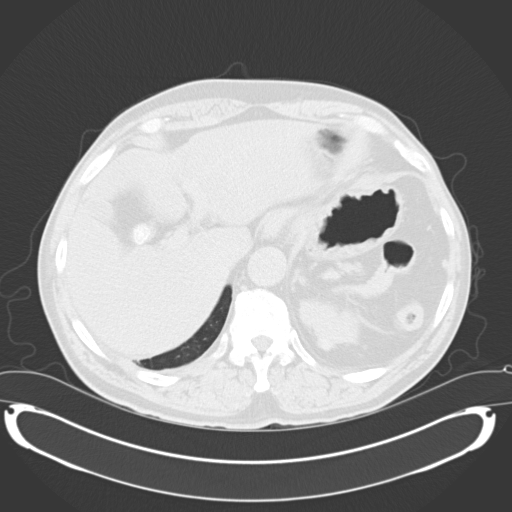
[im 15/65  lung]
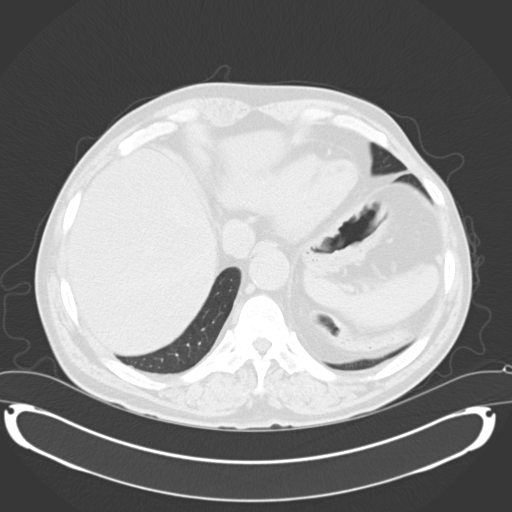
[im 20/65  lung]
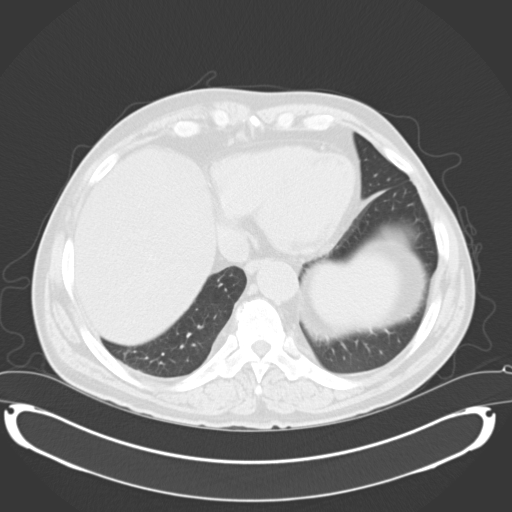
[im 25/65  mediastinal]
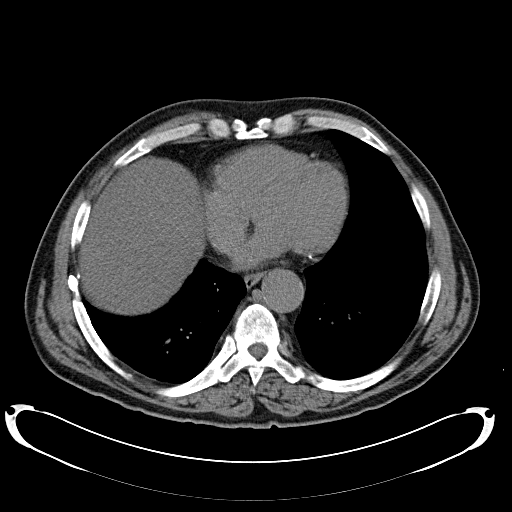
[im 25/65  lung]
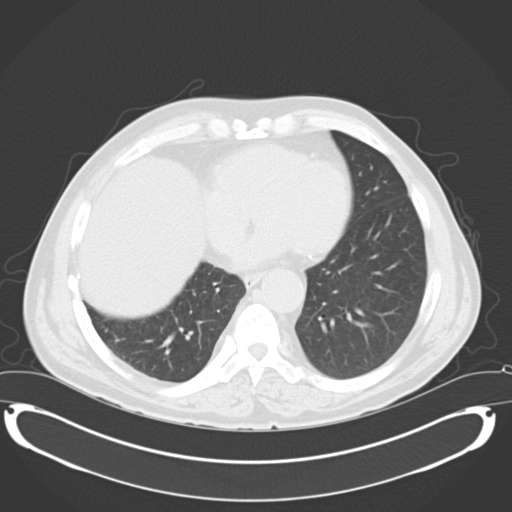
[im 30/65  lung]
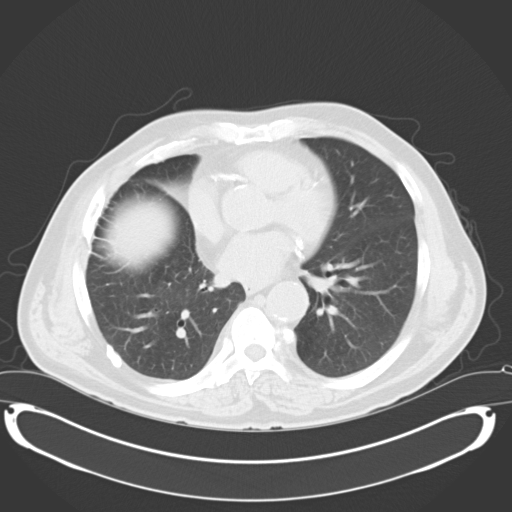
[im 35/65  lung]
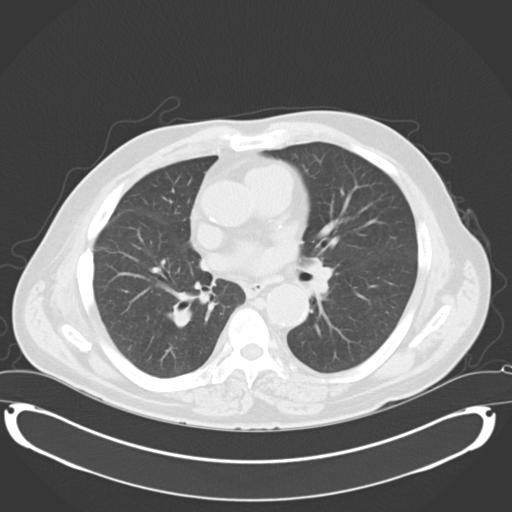
[im 40/65  lung]
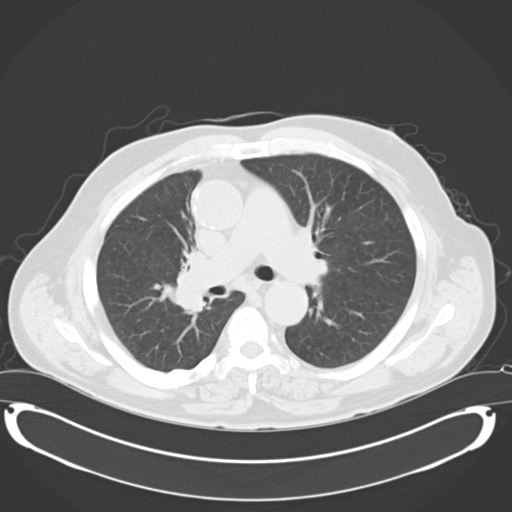
[im 45/65  mediastinal]
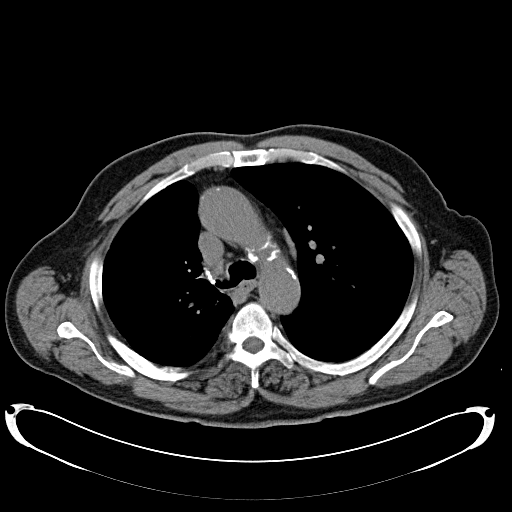
[im 45/65  lung]
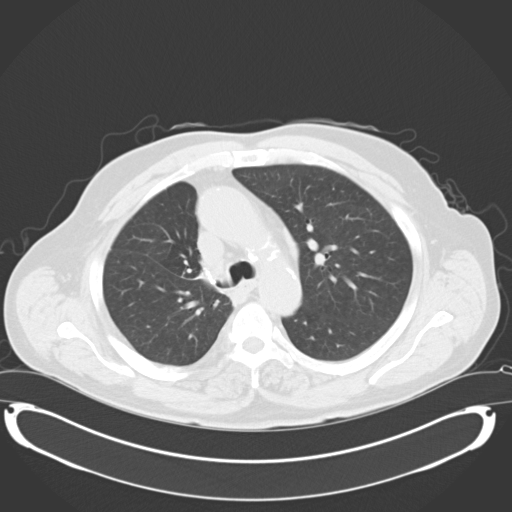
[im 50/65  lung]
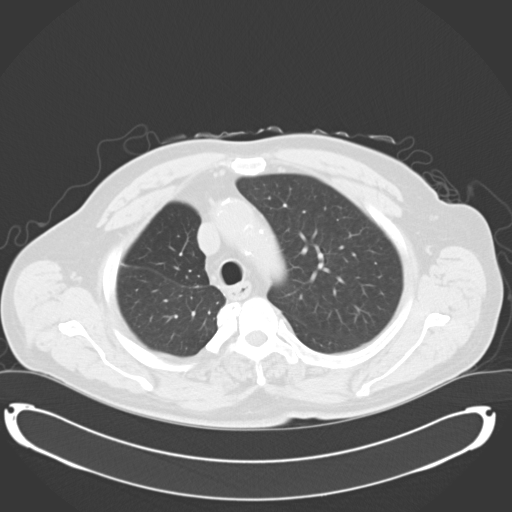
[im 55/65  lung]
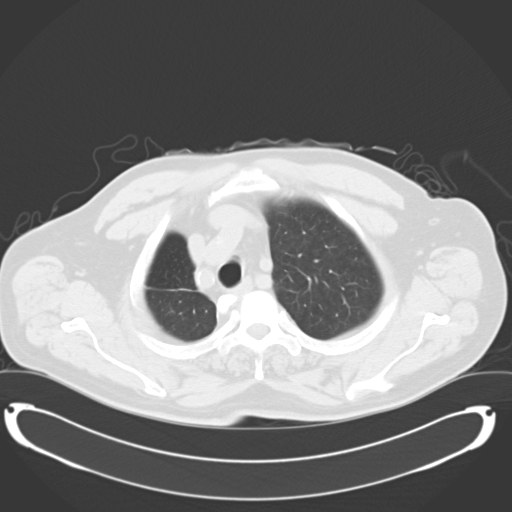
[im 60/65  lung]
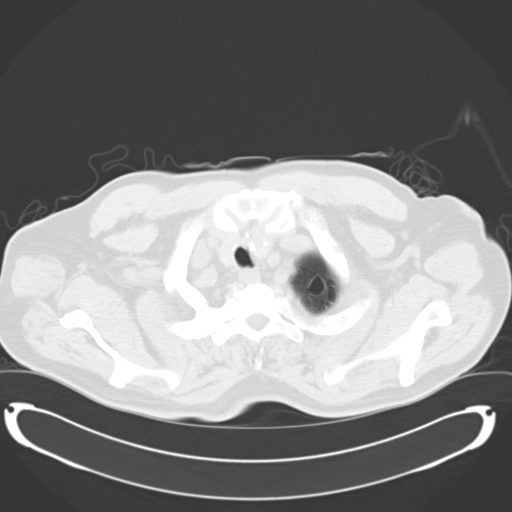

[Series 602: <mpr thick range> · coronal · 0.81mm/px · 3 of 83 slices shown]
[im 17/83  lung]
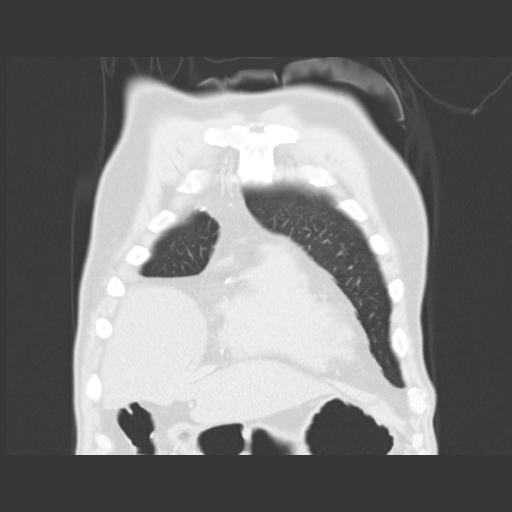
[im 33/83  lung]
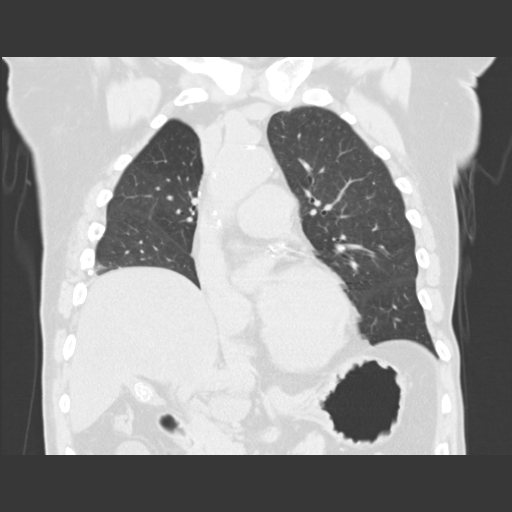
[im 50/83  lung]
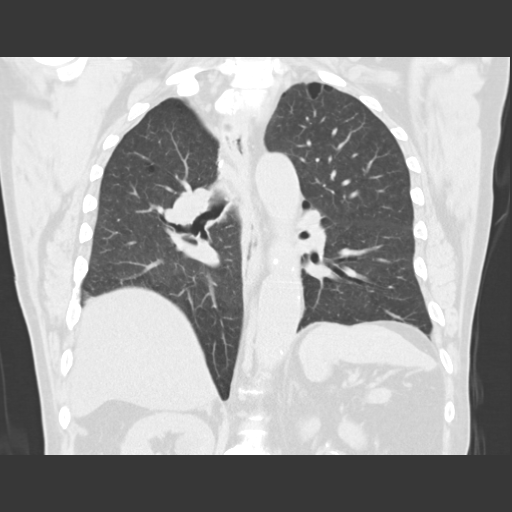

[15 of 36 positions shown; findings below may reference images not displayed]

FINDINGS: Mediastinum: Heart size is normal. There is no significant
pericardial fluid, thickening or pericardial calcification. There
is atherosclerosis of the thoracic aorta, the great vessels of the
mediastinum and the coronary arteries, including calcified
atherosclerotic plaque in the left main, left anterior descending,
left circumflex and right coronary arteries. Curvilinear areas of
hypoattenuation throughout the left ventricular myocardium are
compatible with areas of fibrofatty metaplasia in the
subendocardium related to prior myocardial infarctions.  No
definite ventricular aneurysm is identified at this time. No
pathologically enlarged mediastinal or hilar lymph nodes. Please
note that accurate exclusion of hilar adenopathy is limited on
noncontrast CT scans.  Esophagus is unremarkable in appearance.

Lungs/Pleura: Status post right upper lobectomy.  Compensatory
hyperexpansion of the right middle and lower lobes.  Mild
centrilobular emphysema.  No suspicious appearing pulmonary nodules
or masses are identified at this time.  No acute consolidative air
space disease.

Upper Abdomen: Hypoattenuation throughout the hepatic parenchyma,
compatible with hepatic steatosis.  1.6 cm calcified gallstone in
the gallbladder.  Gallbladder is completely contracted.  No
surrounding inflammatory changes to strongly suggest acute
cholecystitis at this time.  Incompletely visualized 3.0 cm low
attenuation lesion in the medial aspect of the right kidney.
cm hyperdense lesion extending exophytically off the upper pole of
the left kidney is unchanged, favored to represent a small
hemorrhagic or proteinaceous cyst.

Musculoskeletal: There are no aggressive appearing lytic or blastic
lesions noted in the visualized portions of the skeleton.  Post
thoracotomy changes in the right hemithorax redemonstrated.
IMPRESSION: 1.  Status post right upper lobectomy without evidence to suggest
local recurrence of disease or new metastatic disease in the thorax
on today's examination.
2.  Mild centrilobular emphysema.
3. Atherosclerosis, including left main and three-vessel coronary
artery disease.   Additionally, there is evidence of multiple old
subendocardial myocardial infarctions, as above.  No left
ventricular aneurysm noted at this time.
4.  Hepatic steatosis.
5.  Cholelithiasis with no definitive evidence of acute
cholecystitis at this time.
6.  Renal lesions, as above, similar to prior studies, compatible
with simple and proteinaceous/hemorrhagic cysts.

## 2015-07-27 DIAGNOSIS — H25813 Combined forms of age-related cataract, bilateral: Secondary | ICD-10-CM | POA: Diagnosis not present

## 2015-07-27 DIAGNOSIS — H04123 Dry eye syndrome of bilateral lacrimal glands: Secondary | ICD-10-CM | POA: Diagnosis not present

## 2015-07-31 ENCOUNTER — Ambulatory Visit (HOSPITAL_COMMUNITY)
Admission: RE | Admit: 2015-07-31 | Discharge: 2015-07-31 | Disposition: A | Discharge status: Home / Self Care | Payer: Commercial Managed Care - HMO | Source: Ambulatory Visit | Attending: Internal Medicine | Admitting: Internal Medicine

## 2015-07-31 ENCOUNTER — Ambulatory Visit (INDEPENDENT_AMBULATORY_CARE_PROVIDER_SITE_OTHER): Payer: Commercial Managed Care - HMO | Admitting: Student in an Organized Health Care Education/Training Program

## 2015-07-31 ENCOUNTER — Telehealth: Payer: Self-pay | Admitting: Student in an Organized Health Care Education/Training Program

## 2015-07-31 ENCOUNTER — Ambulatory Visit (HOSPITAL_COMMUNITY)
Admission: RE | Admit: 2015-07-31 | Discharge: 2015-07-31 | Disposition: A | Discharge status: Home / Self Care | Payer: Commercial Managed Care - HMO | Source: Ambulatory Visit | Attending: Student in an Organized Health Care Education/Training Program | Admitting: Student in an Organized Health Care Education/Training Program

## 2015-07-31 VITALS — BP 123/54 | HR 77 | Temp 98.3°F

## 2015-07-31 DIAGNOSIS — G8929 Other chronic pain: Secondary | ICD-10-CM | POA: Diagnosis not present

## 2015-07-31 DIAGNOSIS — F1920 Other psychoactive substance dependence, uncomplicated: Secondary | ICD-10-CM

## 2015-07-31 DIAGNOSIS — R000 Tachycardia, unspecified: Secondary | ICD-10-CM | POA: Diagnosis not present

## 2015-07-31 DIAGNOSIS — M25551 Pain in right hip: Secondary | ICD-10-CM

## 2015-07-31 DIAGNOSIS — I491 Atrial premature depolarization: Secondary | ICD-10-CM | POA: Insufficient documentation

## 2015-07-31 DIAGNOSIS — I700 Atherosclerosis of aorta: Secondary | ICD-10-CM

## 2015-07-31 DIAGNOSIS — I10 Essential (primary) hypertension: Secondary | ICD-10-CM

## 2015-07-31 DIAGNOSIS — I498 Other specified cardiac arrhythmias: Secondary | ICD-10-CM | POA: Insufficient documentation

## 2015-07-31 DIAGNOSIS — M25552 Pain in left hip: Secondary | ICD-10-CM

## 2015-07-31 DIAGNOSIS — M7989 Other specified soft tissue disorders: Secondary | ICD-10-CM | POA: Insufficient documentation

## 2015-07-31 DIAGNOSIS — I454 Nonspecific intraventricular block: Secondary | ICD-10-CM | POA: Insufficient documentation

## 2015-07-31 DIAGNOSIS — M86679 Other chronic osteomyelitis, unspecified ankle and foot: Secondary | ICD-10-CM | POA: Insufficient documentation

## 2015-07-31 DIAGNOSIS — M1612 Unilateral primary osteoarthritis, left hip: Secondary | ICD-10-CM

## 2015-07-31 DIAGNOSIS — I493 Ventricular premature depolarization: Secondary | ICD-10-CM | POA: Diagnosis not present

## 2015-07-31 DIAGNOSIS — R9431 Abnormal electrocardiogram [ECG] [EKG]: Secondary | ICD-10-CM | POA: Diagnosis not present

## 2015-07-31 DIAGNOSIS — M79674 Pain in right toe(s): Secondary | ICD-10-CM | POA: Diagnosis not present

## 2015-07-31 DIAGNOSIS — M79675 Pain in left toe(s): Secondary | ICD-10-CM

## 2015-07-31 DIAGNOSIS — Z79891 Long term (current) use of opiate analgesic: Secondary | ICD-10-CM

## 2015-07-31 DIAGNOSIS — I499 Cardiac arrhythmia, unspecified: Secondary | ICD-10-CM

## 2015-07-31 MED ORDER — HYDROCODONE-ACETAMINOPHEN 7.5-325 MG PO TABS: 1.0000 | ORAL_TABLET | Freq: Four times a day (QID) | ORAL | Status: DC | PRN

## 2015-07-31 MED ORDER — METOPROLOL TARTRATE 50 MG PO TABS: 50.0000 mg | ORAL_TABLET | Freq: Two times a day (BID) | ORAL | Status: DC

## 2015-07-31 NOTE — Patient Instructions (Signed)
1. I am worried about your right toe not getting enough blood flow. That can be the cause of your toe pain.   2. We will get an xray of the toe today to make sure there are no bad changes in the bone.   3. Next we will schedule you to get an ultrasound of the right leg so we can see how much blood is flowing down to the toe. This will look for any blockages.   4. I will call you with the results of your tests.

## 2015-07-31 NOTE — Assessment & Plan Note (Addendum)
Noted irregular rhythm on exam. ECG shows premature atrial contractions. No A. Fib. Plan to increase Metoprolol from '25mg'$  daily to '50mg'$  daily.

## 2015-07-31 NOTE — Assessment & Plan Note (Signed)
Blood pressure is at goal. Continue amlodipine.

## 2015-07-31 NOTE — Assessment & Plan Note (Addendum)
Doing well on current regimen of chronic hydrocodone 3-4 tabs daily. Chronic pain generator is osteoarthritis of the left hip. Not interested in joint replacement surgery. Pain medicines are achieving goal of improved functionality. Last database appropriate in November 16. Toxasure 04/2015 was largely normal except for alcohol present. He denies drinking alcohol in the morning, rather he says during football season he drinks moderately on Sunday night, which is consistent with our Monday morning sample.

## 2015-07-31 NOTE — Assessment & Plan Note (Signed)
On exam the toe has some features of poor perfusion, there is dark skin changes, the toe is relatively cool, he has only faint DP pulses. He has many risk factors for vascular disease making atherosclerotic emboli a possible explanation. Infection seems less likely, there is some skin ulceration, but no errythema or drainage. Gout seems unlikely given timing and exam. Plan is to get plain films of the toe today to eval for osteo. Then schedule for ABI of the right leg to eval for reduced flow that may need vascular intervention. Continue aspirin and rosuvastatin. Chronic hydrocodone refilled for pain.

## 2015-07-31 NOTE — Telephone Encounter (Signed)
Xray of the left foot shows some osteolytic changes which raises my suspicion he has a chronic ischemic injury to that toe. I ordered ABIs already, but I am going to refer him to vascular surgery now for consideration of intervention like angiography with stenting. We will need to follow the toe clinically and if it worsens would do an MRI.

## 2015-07-31 NOTE — Progress Notes (Signed)
   See Encounters tab for problem-based medical decision making  __________________________________________________________  HPI:  70 year old man here for new right third toe pain. Started 2 weeks ago. Says it throbs through out the day, hurts more when he walks for extended periods. Has never had vascular intervention on his legs. Denies any drainage or swelling. No trauma. Says it doesn't feel like gout.   He reports good compliance with his other medications. Uses 3-4 tablets of hydrocodone every day to help him walk. Chronic pain generator is osteoarthritis of the left hip joint. Denies oversedation or falls. No other adverse side effects. He walks around the house but doesn't otherwise exercise. He is currently retired on disability. Lives alone at home and has little family support in the area. He tells me he is doing well with his activities of daily living independently at home.  __________________________________________________________  Problem List: Patient Active Problem List   Diagnosis Date Noted  . Toe pain, right 07/31/2015  . Irregular heart beat 07/31/2015  . Dependency on pain medication ([place]) 05/01/2015  . Atherosclerosis of aorta ([place]) 01/13/2015  . Hyperglycemia 12/30/2014  . AAA (abdominal aortic aneurysm) ([place])   . Healthcare maintenance 06/30/2012  . CAD (coronary artery disease) 03/17/2012  . Erectile dysfunction 10/12/2010  . Arthropathy of pelvic region and thigh 05/02/2008  . Chronic kidney disease 08/07/2007  . Personal history of colonic polyps 04/18/2006  . Gout 04/18/2006  . TOBACCO ABUSE 04/18/2006  . Essential hypertension 04/18/2006  . GERD 04/18/2006    Medications: Reconciled today in Epic __________________________________________________________  Physical Exam:  Vital Signs: Filed Vitals:   07/31/15 0904  BP: 123/54  Pulse: 77  Temp: 98.3 F (36.8 C)  TempSrc: Oral    Gen: Well appearing, NAD CV: RRR, no murmurs Ext: Right  third toe has some blackening of the skin at the tip with hyperkeratotic changes of the skin and the nail. No other nails have thickening or signs of fungus. The DP is faintly palpable on the right. I cannot palpate the PT on the right. There is severe hallux valgus of the right.  Skin: No atypical appearing moles. No rashes   ECG: I personally reviewed the ECG tracing. It shows sinus rhythm with frequent premature atrial contractions, some of which are nonconducted. No signs of atrial fibrillation. There was one PVC. No ischemic changes.

## 2015-08-07 ENCOUNTER — Ambulatory Visit (HOSPITAL_COMMUNITY)
Admission: RE | Admit: 2015-08-07 | Discharge: 2015-08-07 | Disposition: A | Discharge status: Home / Self Care | Payer: Commercial Managed Care - HMO | Source: Ambulatory Visit | Attending: Surgery | Admitting: Surgery

## 2015-08-07 DIAGNOSIS — M79674 Pain in right toe(s): Secondary | ICD-10-CM | POA: Diagnosis not present

## 2015-08-07 DIAGNOSIS — F17200 Nicotine dependence, unspecified, uncomplicated: Secondary | ICD-10-CM | POA: Insufficient documentation

## 2015-08-07 DIAGNOSIS — I10 Essential (primary) hypertension: Secondary | ICD-10-CM | POA: Insufficient documentation

## 2015-08-07 DIAGNOSIS — E785 Hyperlipidemia, unspecified: Secondary | ICD-10-CM | POA: Diagnosis not present

## 2015-08-10 ENCOUNTER — Other Ambulatory Visit: Payer: Self-pay | Admitting: *Deleted

## 2015-08-10 MED ORDER — FUROSEMIDE 20 MG PO TABS: ORAL_TABLET | ORAL | Status: DC

## 2015-08-10 MED ORDER — OMEPRAZOLE 20 MG PO CPDR: 20.0000 mg | DELAYED_RELEASE_CAPSULE | Freq: Two times a day (BID) | ORAL | Status: DC

## 2015-08-10 NOTE — Telephone Encounter (Signed)
Received faxed refill request from [facility] sent to pcp for review, please advise.Despina Hidden Cassady2/23/20172:29 PM

## 2015-08-21 ENCOUNTER — Encounter: Payer: Self-pay | Admitting: Vascular Surgery

## 2015-08-25 ENCOUNTER — Ambulatory Visit (INDEPENDENT_AMBULATORY_CARE_PROVIDER_SITE_OTHER): Payer: Commercial Managed Care - HMO | Admitting: Vascular Surgery

## 2015-08-25 ENCOUNTER — Encounter: Payer: Self-pay | Admitting: Vascular Surgery

## 2015-08-25 VITALS — BP 134/78 | HR 70 | Temp 98.4°F | Ht 71.9 in | Wt 196.0 lb

## 2015-08-25 DIAGNOSIS — L97514 Non-pressure chronic ulcer of other part of right foot with necrosis of bone: Secondary | ICD-10-CM | POA: Diagnosis not present

## 2015-08-25 DIAGNOSIS — I714 Abdominal aortic aneurysm, without rupture, unspecified: Secondary | ICD-10-CM

## 2015-08-25 DIAGNOSIS — F17210 Nicotine dependence, cigarettes, uncomplicated: Secondary | ICD-10-CM | POA: Diagnosis not present

## 2015-08-25 NOTE — Progress Notes (Signed)
[facility] [place]  Patient name: [name] MRN: [id number] DOB: 12-27-1945 Sex: male  REASON FOR CONSULT: Possible osteomyelitis of right third toe. Referred by Dr. Evette Doffing.  HPI: [name] is a 70 y.o. male, who is referred for evaluation of possible osteomyelitis of the right third toe. He developed this wound in late January. He does not remember any specific injury to the toe. He thought he had an ingrown toenail and began soaking the foot daily in absence. He's had significant pain at the end of the toe. He denies drainage or cellulitis. He denies fever. The toe hurts when he puts E Hsu on. He denies any history of claudication or rest pain. He does have some arthritis in his hips.  His risk factors for peripheral vascular disease include hypertension, hypercholesterolemia, and a history of tobacco use. He smokes a half a pack per day of cigarettes and has been smoking for 50 years.  Of note, according to the records, he had a 3.3 cm infrarenal abdominal aortic aneurysm in 2014.  I have reviewed the records from Dr. Autumn Patty office. His right third toe pain began in late January. He is not aware of any trauma to the toe. He is retired on disability. His ECG showed sinus rhythm with frequent premature atrial contractions. There was no atrial fibrillation. It were no ischemic changes.  Past Medical History  Diagnosis Date  . Diverticulosis of colon   . GERD (gastroesophageal reflux disease)   . Gout   . Hypertension   . Hyperlipidemia   . Renal failure, acute (HCC)     hx of 2/2 medication, resolved  . Barretts esophagus     bx distal esophagus 2011 neg   . Tobacco abuse   . Alcohol abuse   . Hx of adenomatous colonic polyps   . CHF (congestive heart failure) ([place])   . Hiatal hernia     2 cm noted on upper endos 2011   . Cataract   . Internal hemorrhoid   . DJD (degenerative joint disease)     left hip  . Myocardial infarction ([place])    hx of, 2 stents 2004. pt was on Plavix x 3 years then transitioned to ASA  . Cataract   . Cholelithiasis   . UTI (lower urinary tract infection)   . AAA (abdominal aortic aneurysm) (HCC)     noted CT 11/2012 size 3.3 x 3.2 cm  . 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 Diabetes Mother   . Hypertension Sister   . Heart disease Sister   . Diabetes Sister   . Arthritis Brother   . Gout Brother   . Colon cancer Neg Hx   . Esophageal cancer Neg Hx   . Rectal cancer Neg Hx   . Stomach cancer Neg Hx     SOCIAL HISTORY: Social History   Social History  . Marital Status: Single    Spouse Name: N/A  . Number of Children: 0  . Years of Education: N/A   Occupational History  . Not on file.   Social History Main Topics  . Smoking status: Current Every Day Smoker -- 0.50 packs/day    Types: Cigarettes  . Smokeless tobacco: Never Used     Comment: 1/2 PPD  . Alcohol Use: 0.0 oz/week    0 Standard drinks or equivalent  per week     Comment: drinking 1 pt gin qweek, (mixed with tap water not tonic)  . Drug Use: No  . Sexual Activity: Not on file   Other Topics Concern  . Not on file   Social History Narrative    Allergies  Allergen Reactions  . Candesartan Cilexetil-Hctz Other (See Comments)    REACTION: Acute renal failure  . Hydrochlorothiazide Other (See Comments)    REACTION: Acute renal failure  . Shellfish Allergy Other (See Comments)    Gout    Current Outpatient Prescriptions  Medication Sig Dispense Refill  . allopurinol (ZYLOPRIM) 100 MG tablet TAKE 1 TABLET EVERY DAY 90 tablet 3  . amLODipine (NORVASC) 5 MG tablet TAKE 1 TABLET EVERY DAY 90 tablet 3  . aspirin EC 81 MG tablet Take 81 mg by mouth daily.    . furosemide (LASIX) 20 MG tablet TAKE 1 TABLET (20 MG TOTAL) BY MOUTH DAILY. 90 tablet 3  . HYDROcodone-acetaminophen (NORCO) 7.5-325 MG tablet Take 1 tablet  by mouth every 6 (six) hours as needed for severe pain. 120 tablet 0  . HYDROcodone-acetaminophen (NORCO) 7.5-325 MG tablet Take 1 tablet by mouth every 6 (six) hours as needed for severe pain. 120 tablet 0  . HYDROcodone-acetaminophen (NORCO) 7.5-325 MG tablet Take 1 tablet by mouth every 6 (six) hours as needed for severe pain. 120 tablet 0  . ibuprofen (ADVIL,MOTRIN) 400 MG tablet     . metoprolol tartrate (LOPRESSOR) 50 MG tablet Take 1 tablet (50 mg total) by mouth 2 (two) times daily. 60 tablet 2  . Multiple Vitamin (MULTIVITAMIN) tablet Take 1 tablet by mouth daily.    Marland Kitchen omeprazole (PRILOSEC) 20 MG capsule Take 1 capsule (20 mg total) by mouth 2 (two) times daily. 180 capsule 3  . polyethylene glycol powder (GLYCOLAX/MIRALAX) powder Take 17 g by mouth daily. 500 g 3  . rosuvastatin (CRESTOR) 20 MG tablet TAKE 1 TABLET EVERY DAY 90 tablet 3   No current facility-administered medications for this visit.    REVIEW OF SYSTEMS:  '[X]'$  denotes positive finding, '[ ]'$  denotes negative finding Cardiac  Comments:  Chest pain or chest pressure:    Shortness of breath upon exertion: X   Short of breath when lying flat:    Irregular heart rhythm: X       Vascular    Pain in calf, thigh, or hip brought on by ambulation:    Pain in feet at night that wakes you up from your sleep:     Blood clot in your veins:    Leg swelling:         Pulmonary    Oxygen at home:    Productive cough:     Wheezing:         Neurologic    Sudden weakness in arms or legs:     Sudden numbness in arms or legs:     Sudden onset of difficulty speaking or slurred speech:    Temporary loss of vision in one eye:     Problems with dizziness:         Gastrointestinal    Blood in stool:     Vomited blood:         Genitourinary    Burning when urinating:     Blood in urine:        Psychiatric    Major depression:         Hematologic    Bleeding problems:  Problems with blood clotting too easily:          Skin    Rashes or ulcers:        Constitutional    Fever or chills:      PHYSICAL EXAM: There were no vitals filed for this visit.  GENERAL: The patient is a well-nourished male, in no acute distress. The vital signs are documented above. CARDIAC: There is a regular rate and rhythm.  VASCULAR: I do not detect carotid bruits. On the right side, which is the symptomatic side, he has a palpable femoral, popliteal, and posterior tibial pulse. I cannot obtain a dorsalis pedis pulse. On the left side, he has a palpable femoral, popliteal, and posterior tibial pulse. I cannot palpate a dorsalis pedis pulse. PULMONARY: There is good air exchange bilaterally without wheezing or rales. ABDOMEN: Soft and non-tender with normal pitched bowel sounds.  MUSCULOSKELETAL: Unremarkable. NEUROLOGIC: No focal weakness or paresthesias are detected. SKIN: he has a small dry eschar at the tip of his right third toe. As no cellulitis or drainage. PSYCHIATRIC: The patient has a normal affect.  DATA:   X-RAY RIGHT FOOT: I reviewed the x-ray of the right foot which shows possible osteomyelitis involving the right third toe.  ARTERIAL DOPPLER STUDY: I have reviewed his arterial Doppler study that was done on 08/07/2015.   ABI on the right was 98% with a toe pressure of 88 mmHg. He has a triphasic anterior tibial artery and posterior tibial signal on the right.  The pressure on the left was 99% with a toe pressure of 121 mmHg. He has a triphasic anterior tibial artery and posterior tibial signal on the left.  MEDICAL ISSUES:  POSSIBLE OSTEOMYELITIS OF DISTAL RIGHT THIRD TOE: His x-ray suggests possible osteomyelitis of the tip of the right third toe. I have discussed the options of continued observation for now and only proceeding with toe amputation if the wound does not heal or his pain persists. Based on a toe pressure of 88 mmHg on the right he should have adequate circulation to heal the wound. He may  have some disease in the dorsalis pedis artery on the right. He has a triphasic anterior tibial signal and a triphasic posterior tibial signal but a monophasic dorsalis pedis signal. We have also had a discussion about the ports of tobacco cessation and how this affects wound healing. We spent more than 3 minutes on that conversation. I have also offered him the option of proceeding with toe amputation if he feels that his pain is not tolerable he would like to consider this and I'll see him back in 4-6 weeks to discuss this further unless he calls sooner.  ABDOMINAL AORTIC ANEURYSM: He has a known 3.3 cm infrarenal abdominal aortic aneurysm. This is very small. It has not changed in size from June 2014 to March 2016. This can E followed with ultrasound every 2 years and he'll continue to do that with his primary care physician.   Deitra Mayo [facility] [place] [employer]: [phone]

## 2015-09-19 ENCOUNTER — Encounter: Payer: Self-pay | Admitting: Vascular Surgery

## 2015-09-27 ENCOUNTER — Ambulatory Visit: Payer: Commercial Managed Care - HMO | Admitting: Vascular Surgery

## 2015-10-11 IMAGING — US US AORTA
1 series · 14 of 25 positions shown · non-contrast
Comparison: CT 12/04/2012

CLINICAL DATA: Follow-up AAA

EXAM:
ULTRASOUND OF ABDOMINAL AORTA
TECHNIQUE: Ultrasound examination of the abdominal aorta was performed to
evaluate for abdominal aortic aneurysm.

[Series 1: us aorta · 0.25mm/px · 14 of 25 slices shown]
[im 1/25]
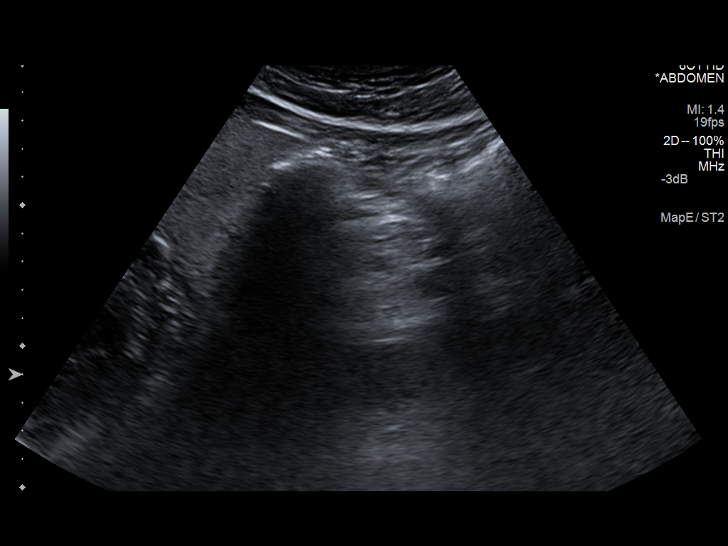
[im 3/25]
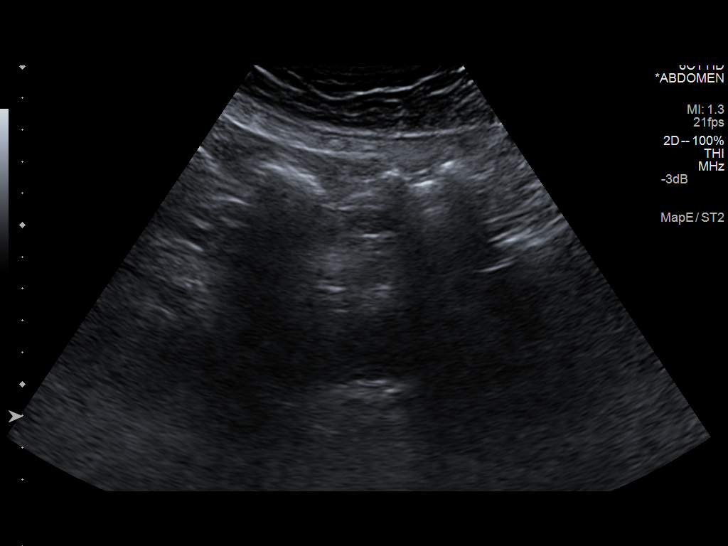
[im 5/25]
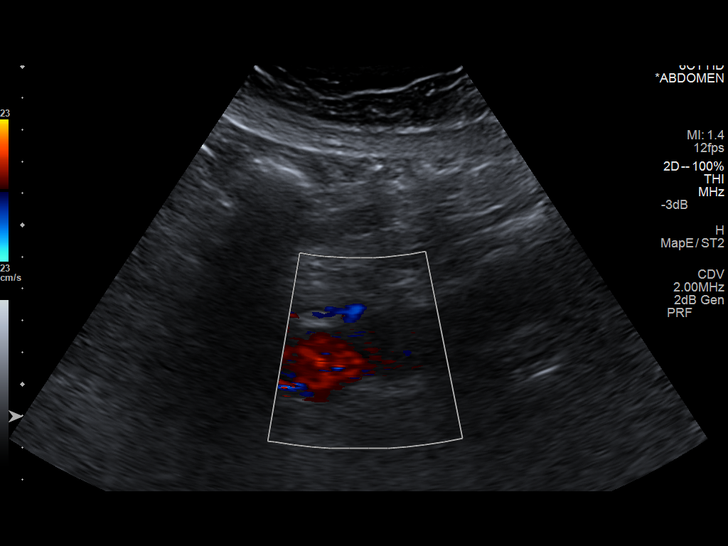
[im 7/25]
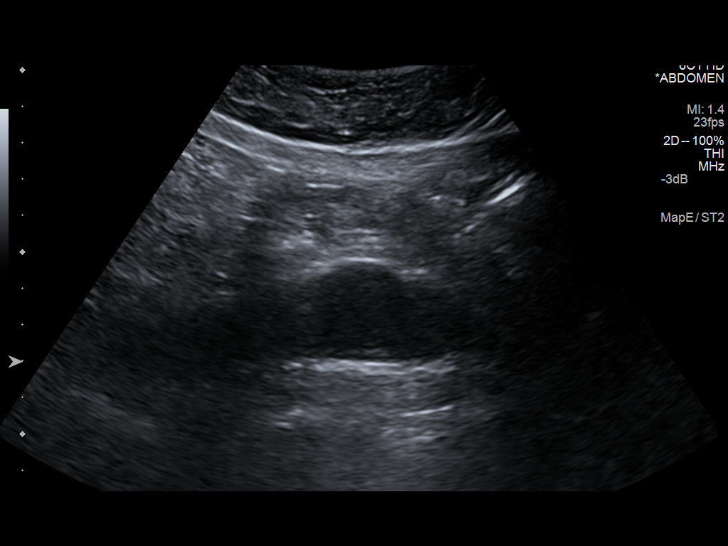
[im 9/25]
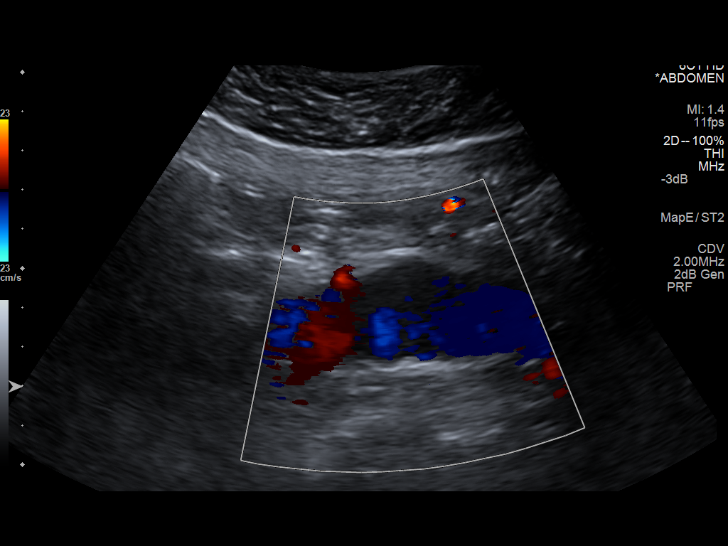
[im 10/25]
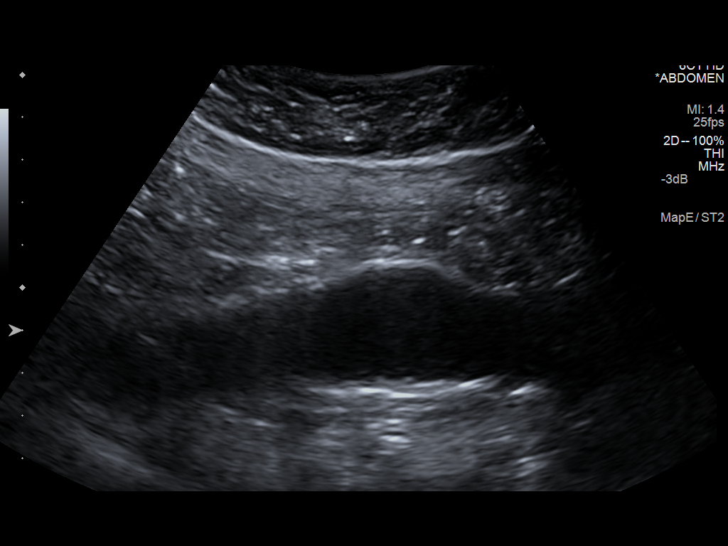
[im 12/25]
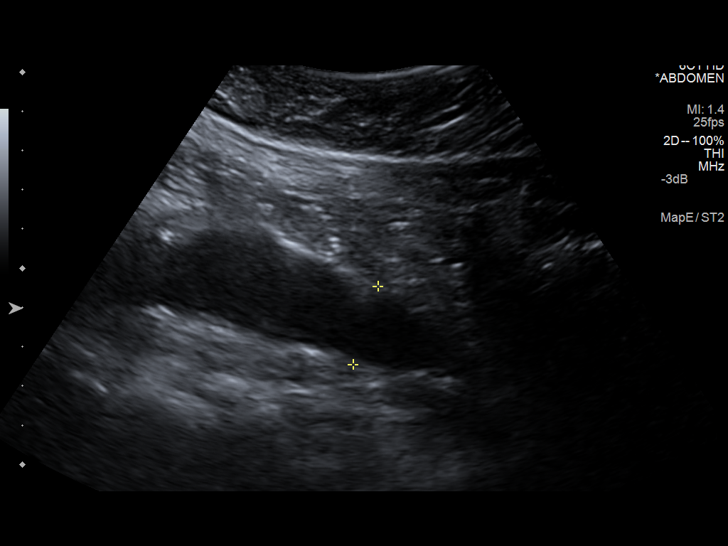
[im 14/25]
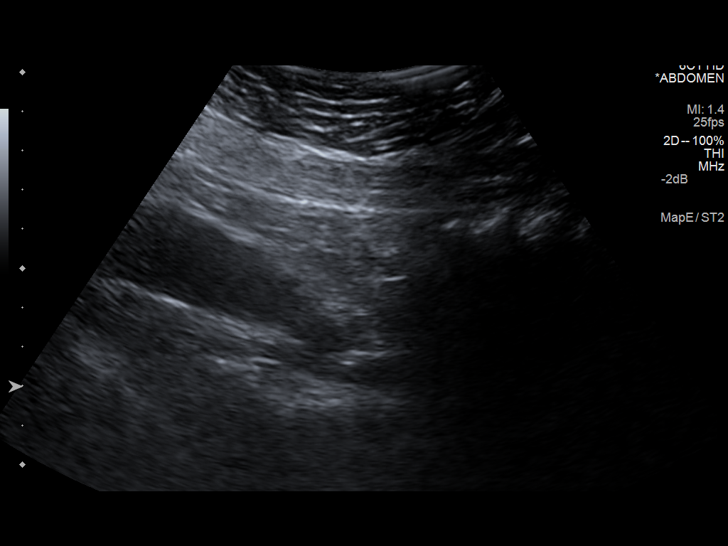
[im 16/25]
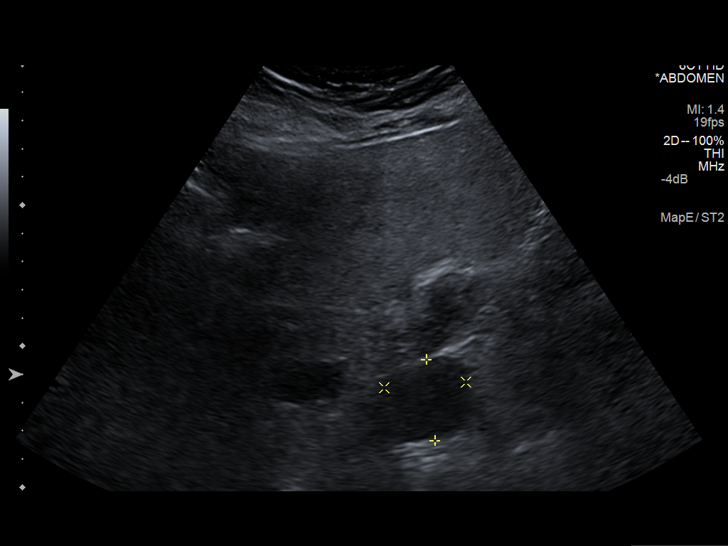
[im 17/25]
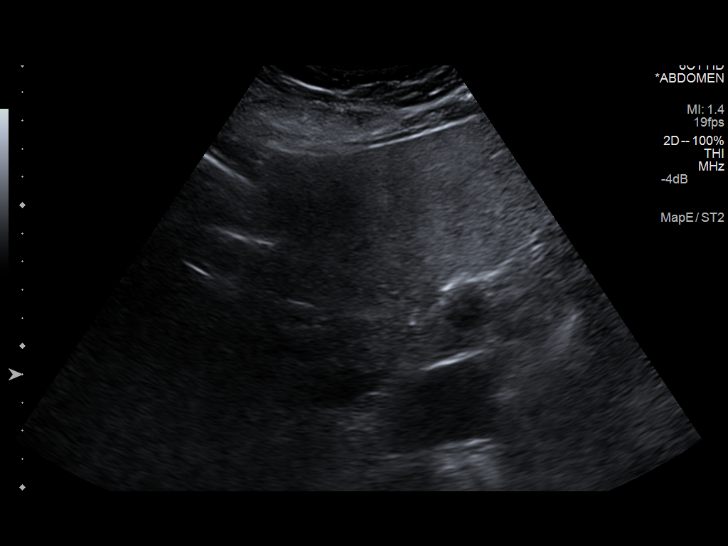
[im 19/25]
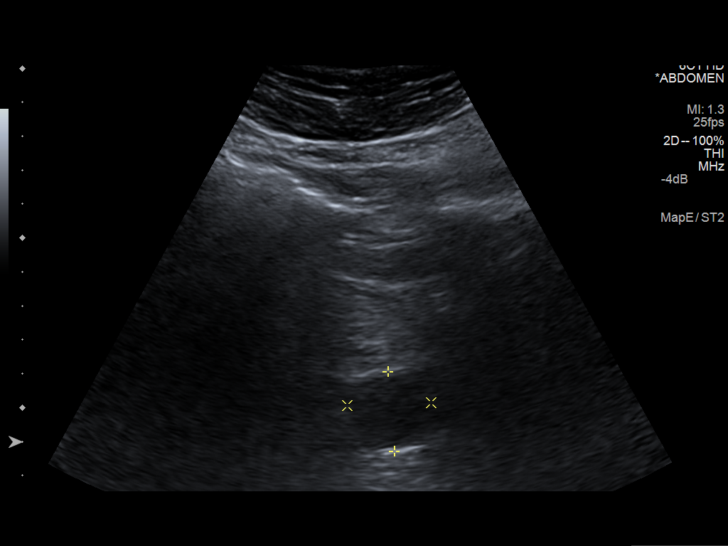
[im 21/25]
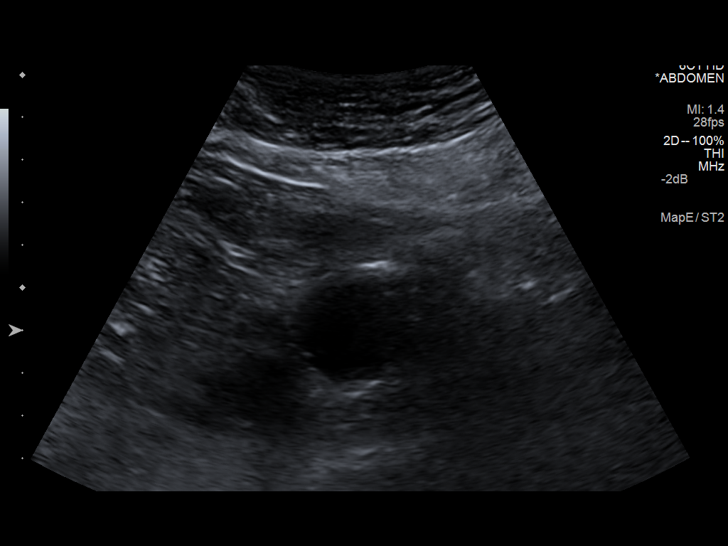
[im 23/25]
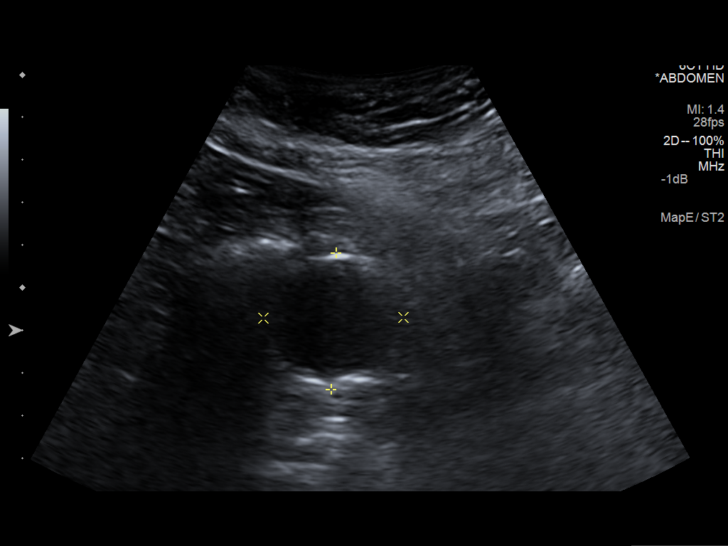
[im 25/25]
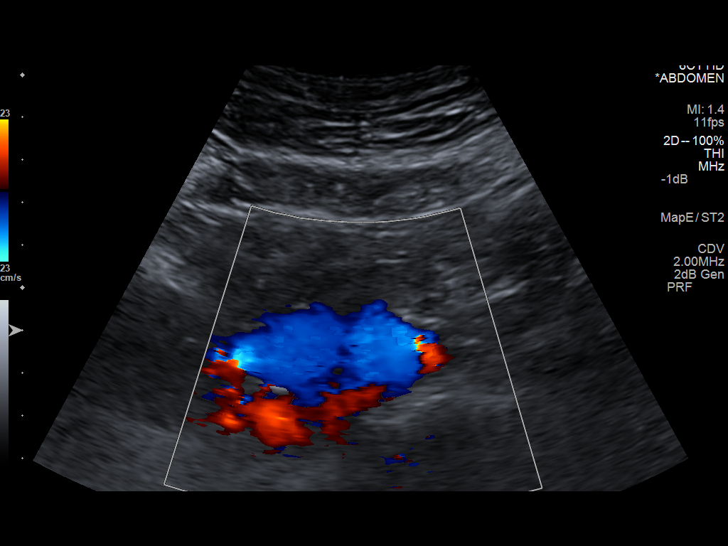

[14 of 25 positions shown; findings below may reference images not displayed]

FINDINGS: Abdominal Aorta

Abdominal aortic aneurysm measures 32 x 33 mm and is unchanged from
the prior study.

Right iliac artery 16 mm.  Left iliac artery 16 mm.
IMPRESSION: Focal abdominal aortic aneurysm measures 32 x 33 mm, unchanged.

## 2015-10-25 ENCOUNTER — Telehealth: Payer: Self-pay | Admitting: Student in an Organized Health Care Education/Training Program

## 2015-10-25 DIAGNOSIS — M25551 Pain in right hip: Secondary | ICD-10-CM

## 2015-10-25 DIAGNOSIS — M25552 Pain in left hip: Principal | ICD-10-CM

## 2015-10-25 MED ORDER — HYDROCODONE-ACETAMINOPHEN 7.5-325 MG PO TABS: 1.0000 | ORAL_TABLET | Freq: Four times a day (QID) | ORAL | Status: DC | PRN

## 2015-10-25 NOTE — Telephone Encounter (Signed)
Last Visit: 07/31/2015 Last UDS: 05/01/2015 Last Refill: 3 30 day prescriptions given 07/31/2015 Future Appointment: 11/20/2015

## 2015-10-25 NOTE — Telephone Encounter (Signed)
Pain med refill °

## 2015-10-25 NOTE — Telephone Encounter (Signed)
Notified patient that prescription was ready to pick up

## 2015-10-30 ENCOUNTER — Other Ambulatory Visit: Payer: Self-pay | Admitting: Student in an Organized Health Care Education/Training Program

## 2015-10-30 DIAGNOSIS — I25118 Atherosclerotic heart disease of native coronary artery with other forms of angina pectoris: Secondary | ICD-10-CM

## 2015-11-17 ENCOUNTER — Telehealth: Payer: Self-pay | Admitting: Student in an Organized Health Care Education/Training Program

## 2015-11-17 NOTE — Telephone Encounter (Signed)
APT. REMINDER CALL, NO ANSWER

## 2015-11-20 ENCOUNTER — Encounter: Payer: Self-pay | Admitting: Student in an Organized Health Care Education/Training Program

## 2015-11-20 ENCOUNTER — Other Ambulatory Visit: Payer: Self-pay | Admitting: Student in an Organized Health Care Education/Training Program

## 2015-11-20 ENCOUNTER — Ambulatory Visit (INDEPENDENT_AMBULATORY_CARE_PROVIDER_SITE_OTHER): Payer: Commercial Managed Care - HMO | Admitting: Student in an Organized Health Care Education/Training Program

## 2015-11-20 VITALS — BP 136/73 | HR 68 | Temp 98.1°F | Ht 71.0 in | Wt 200.0 lb

## 2015-11-20 DIAGNOSIS — F17200 Nicotine dependence, unspecified, uncomplicated: Secondary | ICD-10-CM

## 2015-11-20 DIAGNOSIS — M25551 Pain in right hip: Secondary | ICD-10-CM

## 2015-11-20 DIAGNOSIS — K219 Gastro-esophageal reflux disease without esophagitis: Secondary | ICD-10-CM

## 2015-11-20 DIAGNOSIS — I10 Essential (primary) hypertension: Secondary | ICD-10-CM

## 2015-11-20 DIAGNOSIS — F1920 Other psychoactive substance dependence, uncomplicated: Secondary | ICD-10-CM

## 2015-11-20 DIAGNOSIS — M1612 Unilateral primary osteoarthritis, left hip: Secondary | ICD-10-CM | POA: Diagnosis not present

## 2015-11-20 DIAGNOSIS — N529 Male erectile dysfunction, unspecified: Secondary | ICD-10-CM

## 2015-11-20 DIAGNOSIS — Z79891 Long term (current) use of opiate analgesic: Secondary | ICD-10-CM

## 2015-11-20 DIAGNOSIS — M86671 Other chronic osteomyelitis, right ankle and foot: Secondary | ICD-10-CM

## 2015-11-20 DIAGNOSIS — N5203 Combined arterial insufficiency and corporo-venous occlusive erectile dysfunction: Secondary | ICD-10-CM

## 2015-11-20 DIAGNOSIS — M25552 Pain in left hip: Principal | ICD-10-CM

## 2015-11-20 MED ORDER — OMEPRAZOLE 20 MG PO CPDR: 20.0000 mg | DELAYED_RELEASE_CAPSULE | Freq: Every day | ORAL | Status: DC

## 2015-11-20 MED ORDER — HYDROCODONE-ACETAMINOPHEN 7.5-325 MG PO TABS: 1.0000 | ORAL_TABLET | Freq: Four times a day (QID) | ORAL | Status: DC | PRN

## 2015-11-20 NOTE — Assessment & Plan Note (Signed)
Chronic pain generator is osteoarthritis of the left hip. He uses Norco 7.5 mg up to 4 times daily as needed. 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 The patient had no questions about the contract, I gave him a copy and we scanned a copy into Epic. Last database and tox screen in November 2016 were appropriate. I refilled Norco #120 R-2 for three month supply; all future refills to be done by me in a visit.

## 2015-11-20 NOTE — Assessment & Plan Note (Signed)
Current every day smoker, continues to be pre-contemplative about quitting. Says "I can quit any day I wants." Doesn't seem to equate the risk that his tobacco has with his atherosclerotic heart disease, PAD, and erectile dysfunction. I spent greater than 3 minutes counseling on the importance of tobacco cessation and we will follow up on this at our next visit.

## 2015-11-20 NOTE — Patient Instructions (Signed)
1. Keep taking your pain medication as prescribed.   2. Reduce Omeprazole to one tablet just once a day for your reflux pain. If the reflux burning comes back, go back to taking one tablet twice a day.

## 2015-11-20 NOTE — Progress Notes (Signed)
   See Encounters tab for problem-based medical decision making  __________________________________________________________  HPI:  70 year old man here for follow-up of right hip chronic pain. Patient's been doing fairly well since I saw him 3 months ago. At that visit we noticed that he was having significant pain in his right third toe with also a chronic-appearing ulcer at the tip. X-rays from that visit showed some changes to the distal phalanx consistent with likely chronic osteomyelitis. We got him into see vascular surgery pretty quickly, they decided to watch him conservatively as he was very resistant to toe amputation surgery. The patient said that he had several weeks of some chronic pain in that toe but over the last few weeks the pain has pretty much disappeared. He walks with a little bit of a limp still. He had a follow-up but he didn't keep it with vascular surgery because he says "I don't want him to take my toe." Still continues to smoke about a pack per day.   Uses Norco 3-4 times daily as needed for pain associated with left hip osteoarthritis. Says that the pain medicines are very effective in helping him function, he gets outside the house and does yard work, does chores in the house like cooking, Education administrator, Medical sales representative. Eating and drinking well. No fevers or chills. Does report erectile dysfunction, says he is not currently sexually active. Does not achieve nocturnal tumescence. Has not tried medications in the past.   __________________________________________________________  Problem List: Patient Active Problem List   Diagnosis Date Noted  . Chronic osteomyelitis of toe ([place]) 07/31/2015  . Irregular heart beat 07/31/2015  . Dependency on pain medication ([place]) 05/01/2015  . Atherosclerosis of aorta ([place]) 01/13/2015  . Hyperglycemia 12/30/2014  . AAA (abdominal aortic aneurysm) ([place])   . Healthcare maintenance 06/30/2012  . Coronary artery disease of native artery of native  heart with stable angina pectoris ([place]) 03/17/2012  . Erectile dysfunction 10/12/2010  . Arthropathy of pelvic region and thigh 05/02/2008  . Chronic kidney disease 08/07/2007  . Personal history of colonic polyps 04/18/2006  . Gout 04/18/2006  . Tobacco use disorder 04/18/2006  . Essential hypertension 04/18/2006  . GERD 04/18/2006    Medications: Reconciled today in Epic __________________________________________________________  Physical Exam:  Vital Signs: Filed Vitals:   11/20/15 0847  BP: 136/73  Pulse: 68  Temp: 98.1 F (36.7 C)  TempSrc: Oral  Height: '5\' 11"'$  (1.803 m)  Weight: 200 lb (90.719 kg)  SpO2: 100%    Gen: Well appearing, NAD CV: RRR, no murmurs Pulm: Normal effort, CTA throughout, no wheezing Abd: Soft, NT, ND, normal BS.  Genitals: Normal penis, normal hair distribution, normal sized testicles with no mass or hydrocele. Ext: Cool to the touch, right third toe lies under the second, there is chronic ulceration at the tip with callus formation, no errythema, tenderness or drainage. Skin: No atypical appearing moles. No rashes

## 2015-11-20 NOTE — Assessment & Plan Note (Signed)
Erectile dysfunction present for several years, most likely owing to atherosclerotic disease. Could also be effect of chronic narcotic therapy. He also has no nocturnal tumescence. On exam there are no features of low testosterone. He is not interested in using Viagra, cost is prohibitive. I advised him to quit smoking, continue statin and aspirin and good blood pressure control, and we can think about reducing the amount of pain medication in the future.

## 2015-11-20 NOTE — Assessment & Plan Note (Signed)
We found a chronic wound on the tip of the right third toe at our last visit. Xray of the foot showed changes consistent with probable osteomyelitis of the third phalanx. He was sent to Dr. Scot Dock with vascular surgery. They decided on conservative observation. Today his toe is showing signs of improvement, pain is reduced. He has fairly good vascular supply to the distal foot. He will follow up with vascular surgery again; it seems he likely has avoided a toe amputation this time.

## 2015-11-20 NOTE — Assessment & Plan Note (Signed)
Reflux disease for many years, history of Barrett's esophagus. He has had surveillance EGDs, last October 2016. He's now had 2 EGDs with biopsy that were negative for intestinal metaplasia so he will get no further surveillance EGDs. We decided to reduce omeprazole from 20 mg twice a day to 20 mg daily and gauge his symptoms. If he does well in 3 months at our next visit we can try to discontinue omeprazole.

## 2016-01-25 DIAGNOSIS — H40053 Ocular hypertension, bilateral: Secondary | ICD-10-CM | POA: Diagnosis not present

## 2016-01-25 DIAGNOSIS — H25813 Combined forms of age-related cataract, bilateral: Secondary | ICD-10-CM | POA: Diagnosis not present

## 2016-01-26 ENCOUNTER — Ambulatory Visit (HOSPITAL_COMMUNITY)
Admission: RE | Admit: 2016-01-26 | Discharge: 2016-01-26 | Disposition: A | Discharge status: Home / Self Care | Payer: Commercial Managed Care - HMO | Source: Ambulatory Visit | Attending: Internal Medicine | Admitting: Internal Medicine

## 2016-01-26 ENCOUNTER — Other Ambulatory Visit (HOSPITAL_BASED_OUTPATIENT_CLINIC_OR_DEPARTMENT_OTHER): Payer: Commercial Managed Care - HMO

## 2016-01-26 DIAGNOSIS — K760 Fatty (change of) liver, not elsewhere classified: Secondary | ICD-10-CM | POA: Diagnosis not present

## 2016-01-26 DIAGNOSIS — K8020 Calculus of gallbladder without cholecystitis without obstruction: Secondary | ICD-10-CM | POA: Insufficient documentation

## 2016-01-26 DIAGNOSIS — Z902 Acquired absence of lung [part of]: Secondary | ICD-10-CM | POA: Insufficient documentation

## 2016-01-26 DIAGNOSIS — C3491 Malignant neoplasm of unspecified part of right bronchus or lung: Secondary | ICD-10-CM | POA: Insufficient documentation

## 2016-01-26 DIAGNOSIS — I2510 Atherosclerotic heart disease of native coronary artery without angina pectoris: Secondary | ICD-10-CM | POA: Diagnosis not present

## 2016-01-26 DIAGNOSIS — I288 Other diseases of pulmonary vessels: Secondary | ICD-10-CM | POA: Insufficient documentation

## 2016-01-26 DIAGNOSIS — R918 Other nonspecific abnormal finding of lung field: Secondary | ICD-10-CM | POA: Insufficient documentation

## 2016-01-26 DIAGNOSIS — Z85118 Personal history of other malignant neoplasm of bronchus and lung: Secondary | ICD-10-CM

## 2016-01-26 DIAGNOSIS — N289 Disorder of kidney and ureter, unspecified: Secondary | ICD-10-CM | POA: Diagnosis not present

## 2016-01-26 LAB — COMPREHENSIVE METABOLIC PANEL
ALBUMIN: 3.6 g/dL (ref 3.5–5.0)
ALK PHOS: 86 U/L (ref 40–150)
ALT: 31 U/L (ref 0–55)
ANION GAP: 10 meq/L (ref 3–11)
AST: 45 U/L — AB (ref 5–34)
BUN: 14.3 mg/dL (ref 7.0–26.0)
CALCIUM: 9.6 mg/dL (ref 8.4–10.4)
CHLORIDE: 103 meq/L (ref 98–109)
CO2: 27 mEq/L (ref 22–29)
Creatinine: 1.3 mg/dL (ref 0.7–1.3)
EGFR: 66 mL/min/{1.73_m2} — ABNORMAL LOW (ref >90)
Glucose: 113 mg/dl (ref 70–140)
POTASSIUM: 3.9 meq/L (ref 3.5–5.1)
Sodium: 141 mEq/L (ref 136–145)
Total Bilirubin: 0.74 mg/dL (ref 0.20–1.20)
Total Protein: 7.7 g/dL (ref 6.4–8.3)

## 2016-01-26 LAB — CBC WITH DIFFERENTIAL/PLATELET
BASO%: 0.7 % (ref 0.0–2.0)
BASOS ABS: 0 10*3/uL (ref 0.0–0.1)
EOS ABS: 0.2 10*3/uL (ref 0.0–0.5)
EOS%: 2.9 % (ref 0.0–7.0)
HEMATOCRIT: 45.4 % (ref 38.4–49.9)
HEMOGLOBIN: 14.5 g/dL (ref 13.0–17.1)
LYMPH#: 1.2 10*3/uL (ref 0.9–3.3)
LYMPH%: 23.3 % (ref 14.0–49.0)
MCH: 31.9 pg (ref 27.2–33.4)
MCHC: 32 g/dL (ref 32.0–36.0)
MCV: 99.7 fL — AB (ref 79.3–98.0)
MONO#: 0.4 10*3/uL (ref 0.1–0.9)
MONO%: 7.1 % (ref 0.0–14.0)
NEUT#: 3.5 10*3/uL (ref 1.5–6.5)
NEUT%: 66 % (ref 39.0–75.0)
PLATELETS: 145 10*3/uL (ref 140–400)
RBC: 4.56 10*6/uL (ref 4.20–5.82)
RDW: 14.1 % (ref 11.0–14.6)
WBC: 5.3 10*3/uL (ref 4.0–10.3)

## 2016-01-30 ENCOUNTER — Encounter: Payer: Self-pay | Admitting: Internal Medicine

## 2016-01-30 ENCOUNTER — Ambulatory Visit (HOSPITAL_BASED_OUTPATIENT_CLINIC_OR_DEPARTMENT_OTHER): Payer: Commercial Managed Care - HMO | Admitting: Internal Medicine

## 2016-01-30 DIAGNOSIS — Z7289 Other problems related to lifestyle: Secondary | ICD-10-CM | POA: Diagnosis not present

## 2016-01-30 DIAGNOSIS — Z85118 Personal history of other malignant neoplasm of bronchus and lung: Secondary | ICD-10-CM

## 2016-01-30 DIAGNOSIS — R911 Solitary pulmonary nodule: Secondary | ICD-10-CM

## 2016-01-30 DIAGNOSIS — R0609 Other forms of dyspnea: Secondary | ICD-10-CM

## 2016-01-30 DIAGNOSIS — C3491 Malignant neoplasm of unspecified part of right bronchus or lung: Secondary | ICD-10-CM

## 2016-01-30 DIAGNOSIS — Z720 Tobacco use: Secondary | ICD-10-CM | POA: Diagnosis not present

## 2016-01-30 HISTORY — DX: Malignant neoplasm of unspecified part of right bronchus or lung: C34.91

## 2016-01-30 NOTE — Progress Notes (Signed)
[facility] [phone]   [phone]  OFFICE PROGRESS NOTE  Axel Filler, MD [address] [place] [address]  DIAGNOSIS: Stage IB (T2, N0, M0) non-small cell lung cancer diagnosed in November of 2007   PRIOR THERAPY: Status post right upper lobectomy with mediastinal lymph node dissection under the care of Dr. Roxan Hockey on 04/28/2006. The tumor size was 8 CM but the patient refused adjuvant chemotherapy at that time.   CURRENT THERAPY: Observation.  INTERVAL HISTORY: [name] 70 y.o. male returns to the clinic today for annual followup visit. He has been observation for the last 10 years and doing well except for occasional shortness breath with exertion. The patient is feeling fine today with no specific complaints. He denied having any significant chest pain, cough or hemoptysis. He has no significant weight loss or night sweats. No nausea or vomiting, no fever or chills. Unfortunately he continues to smoke 0.5 pack per day and he drinks alcohol at regular basis. The patient had repeat CT scan of the chest performed recently and he is here for evaluation and discussion of his scan results.  MEDICAL HISTORY: Past Medical History:  Diagnosis Date  . AAA (abdominal aortic aneurysm) (HCC)    noted CT 11/2012 size 3.3 x 3.2 cm  . Alcohol abuse   . Barretts esophagus    bx distal esophagus 2011 neg   . Cataract   . Cataract   . CHF (congestive heart failure) ([facility])   . Cholelithiasis   . Diverticulosis of colon   . DJD (degenerative joint disease)    left hip  . GERD (gastroesophageal reflux disease)   . Gout   . Hiatal hernia    2 cm noted on upper endos 2011   . Hx of adenomatous colonic polyps   . Hyperlipidemia   . Hypertension   . Internal hemorrhoid   . 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 Myocardial infarction ([place])    hx of, 2 stents 2004. pt was on  Plavix x 3 years then transitioned to ASA  . Renal failure, acute (HCC)    hx of 2/2 medication, resolved  . Tobacco abuse   . UTI (lower urinary tract infection)     ALLERGIES:  is allergic to candesartan cilexetil-hctz; hydrochlorothiazide; and shellfish allergy.  MEDICATIONS:  Current Outpatient Prescriptions  Medication Sig Dispense Refill  . allopurinol (ZYLOPRIM) 100 MG tablet TAKE 1 TABLET EVERY DAY 90 tablet 3  . amLODipine (NORVASC) 5 MG tablet TAKE 1 TABLET EVERY DAY 90 tablet 3  . aspirin EC 81 MG tablet Take 81 mg by mouth daily.    . furosemide (LASIX) 20 MG tablet TAKE 1 TABLET (20 MG TOTAL) BY MOUTH DAILY. 90 tablet 3  . HYDROcodone-acetaminophen (NORCO) 7.5-325 MG tablet Take 1 tablet by mouth every 6 (six) hours as needed for severe pain. 120 tablet 0  . metoprolol (LOPRESSOR) 50 MG tablet Take 1 tablet (50 mg total) by mouth 2 (two) times daily. 180 tablet 3  . Multiple Vitamin (MULTIVITAMIN) tablet Take 1 tablet by mouth daily.    Marland Kitchen omeprazole (PRILOSEC) 20 MG capsule Take 1 capsule (20 mg total) by mouth daily. 90 capsule 3  . rosuvastatin (CRESTOR) 20 MG tablet TAKE 1 TABLET EVERY DAY 90 tablet 3   No current facility-administered medications for this visit.     SURGICAL HISTORY:  Past  Surgical History:  Procedure Laterality Date  . COLONOSCOPY    . CORONARY ANGIOPLASTY WITH STENT PLACEMENT    . flexible cystourethroscopy  05/24/06  . INGUINAL HERNIA REPAIR  2005  . LUNG LOBECTOMY Right 11/07   RUL  . UPPER GASTROINTESTINAL ENDOSCOPY      REVIEW OF SYSTEMS:  A comprehensive review of systems was negative except for: Respiratory: positive for dyspnea on exertion   PHYSICAL EXAMINATION: General appearance: alert, cooperative and no distress Head: Normocephalic, without obvious abnormality, atraumatic Neck: no adenopathy Lymph nodes: Cervical, supraclavicular, and axillary nodes normal. Resp: clear to auscultation bilaterally Cardio: regular rate and  rhythm, S1, S2 normal, no murmur, click, rub or gallop GI: soft, non-tender; bowel sounds normal; no masses,  no organomegaly Extremities: extremities normal, atraumatic, no cyanosis or edema  ECOG PERFORMANCE STATUS: 1 - Symptomatic but completely ambulatory  Blood pressure (!) 141/68, pulse 65, temperature 98.3 F (36.8 C), temperature source Oral, resp. rate 18, height '5\' 11"'$  (1.803 m), weight 198 lb 14.4 oz (90.2 kg), SpO2 95 %.  LABORATORY DATA: Lab Results  Component Value Date   WBC 5.3 01/26/2016   HGB 14.5 01/26/2016   HCT 45.4 01/26/2016   MCV 99.7 (H) 01/26/2016   PLT 145 01/26/2016      Chemistry      Component Value Date/Time   NA 141 01/26/2016 0858   K 3.9 01/26/2016 0858   CL 106 03/02/2015 1137   CL 101 01/25/2011 0901   CO2 27 01/26/2016 0858   BUN 14.3 01/26/2016 0858   CREATININE 1.3 01/26/2016 0858      Component Value Date/Time   CALCIUM 9.6 01/26/2016 0858   ALKPHOS 86 01/26/2016 0858   AST 45 (H) 01/26/2016 0858   ALT 31 01/26/2016 0858   BILITOT 0.74 01/26/2016 0858       RADIOGRAPHIC STUDIES: Ct Chest Wo Contrast  Result Date: 01/26/2016 CLINICAL DATA:  Right-sided lung cancer in 2007 with surgery only. Right upper lobectomy. EXAM: CT CHEST WITHOUT CONTRAST TECHNIQUE: Multidetector CT imaging of the chest was performed following the standard protocol without IV contrast. COMPARISON:  01/24/2015 FINDINGS: Cardiovascular: Advanced aortic and branch vessel atherosclerosis. Tortuous thoracic aorta. Mild cardiomegaly with multivessel coronary artery atherosclerosis. Areas of prior left ventricular infarct, as previously. Pulmonary artery enlargement, including 3.7 cm outflow tract. Mediastinum/Nodes: No supraclavicular adenopathy. No mediastinal or definite hilar adenopathy, given limitations of unenhanced CT. Lungs/Pleura: No pleural fluid. Status post right upper lobectomy. Multiple bilateral pulmonary nodules which are not readily apparent on the  prior exam. Examples within the right lower lobe at 4 mm on image 52/series 5 and within the left lower lobe at 5 mm on image 57/series 5. Upper Abdomen: Moderate hepatic steatosis. Cholelithiasis. Normal imaged portions of the spleen, stomach, pancreas, adrenal glands. Similar appearance of 2 adjacent hyperattenuating upper pole left renal lesions which each measure on the order of 10 mm. Bilateral aortic and renal vascular calcifications. Right renal cysts. Musculoskeletal: Posttraumatic or postsurgical rib defects. IMPRESSION: 1. Status post right upper lobectomy. 2. Multiple bilateral tiny pulmonary nodules. These are not readily apparent on the prior exam. This could be related to thinner slice combination today. However, developing pulmonary nodularity cannot be excluded. Consider follow-up at 6 month with chest CT. 3.  Coronary artery atherosclerosis. Aortic atherosclerosis. 4. Hepatic steatosis. 5. Pulmonary artery enlargement suggests pulmonary arterial hypertension. 6. Similar size of indeterminate upper pole left renal lesions. Recommend attention on follow-up. 7. Cholelithiasis. Electronically Signed   By: Marylyn Ishihara  Jobe Igo M.D.   On: 01/26/2016 10:38   ASSESSMENT AND PLAN: This is a very pleasant 70 years old [place] American male with history of stage IB non-small cell lung cancer status post right upper lobectomy with mediastinal lymph node dissection and has been observation for the last 10 years with no significant evidence for disease recurrence but the recent scan showed multiple bilateral tiny pulmonary nodules that need close monitoring.  I discussed the scan results with the patient today.  I recommended for him to continue on observation with repeat CT scan of the chest without contrast in 6 months for reevaluation of these pulmonary nodules. If no concerning finding for disease recurrence on the upcoming scan, I may discharge the patient to his primary care physician for routine follow-up  visit. He was advised to call immediately she has any concerning symptoms in the interval.  The patient voices understanding of current disease status and treatment options and is in agreement with the current care plan.  All questions were answered. The patient knows to call the clinic with any problems, questions or concerns. We can certainly see the patient much sooner if necessary.  Disclaimer: This note was dictated with voice recognition software. Similar sounding words can inadvertently be transcribed and may be missed upon review.

## 2016-01-31 ENCOUNTER — Telehealth: Payer: Self-pay | Admitting: Hematology

## 2016-01-31 NOTE — Telephone Encounter (Signed)
GAVE PATIENT DTR AVS REPORT AND APPOINTMENTS FOR AUGUST AND September, INCLUDING MAMMO/US/BX AT [facility] 8/23. SPOKE WITH CHERISH AT [facility] AND ONLY ORDER FOR [place] WAS ENTERED, HOWEVER PER LOS PATIENT NEEDS NEEDLE BX. PER CHERISH PATIENT WILL ALSO NEED A MAMMO. APPOINTMENTS SCHEDULED FOR MAMMO/US/BX AND [facility] WILL SEND ORDERS TO [facility] TO SIGN.

## 2016-02-02 ENCOUNTER — Telehealth: Payer: Self-pay | Admitting: Internal Medicine

## 2016-02-02 NOTE — Telephone Encounter (Signed)
Appointment confirmed with patient. Appt letter and schd mailed, per patient request.

## 2016-02-20 ENCOUNTER — Ambulatory Visit (INDEPENDENT_AMBULATORY_CARE_PROVIDER_SITE_OTHER): Payer: Commercial Managed Care - HMO | Admitting: Internal Medicine

## 2016-02-20 DIAGNOSIS — M86671 Other chronic osteomyelitis, right ankle and foot: Secondary | ICD-10-CM

## 2016-02-20 DIAGNOSIS — I10 Essential (primary) hypertension: Secondary | ICD-10-CM | POA: Diagnosis not present

## 2016-02-20 DIAGNOSIS — F17210 Nicotine dependence, cigarettes, uncomplicated: Secondary | ICD-10-CM

## 2016-02-20 DIAGNOSIS — Z85118 Personal history of other malignant neoplasm of bronchus and lung: Secondary | ICD-10-CM

## 2016-02-20 DIAGNOSIS — M1612 Unilateral primary osteoarthritis, left hip: Secondary | ICD-10-CM

## 2016-02-20 DIAGNOSIS — Z79891 Long term (current) use of opiate analgesic: Secondary | ICD-10-CM

## 2016-02-20 DIAGNOSIS — K219 Gastro-esophageal reflux disease without esophagitis: Secondary | ICD-10-CM | POA: Diagnosis not present

## 2016-02-20 DIAGNOSIS — B9689 Other specified bacterial agents as the cause of diseases classified elsewhere: Secondary | ICD-10-CM

## 2016-02-20 DIAGNOSIS — M25551 Pain in right hip: Secondary | ICD-10-CM

## 2016-02-20 DIAGNOSIS — C3491 Malignant neoplasm of unspecified part of right bronchus or lung: Secondary | ICD-10-CM

## 2016-02-20 DIAGNOSIS — Z79899 Other long term (current) drug therapy: Secondary | ICD-10-CM

## 2016-02-20 DIAGNOSIS — F1020 Alcohol dependence, uncomplicated: Secondary | ICD-10-CM

## 2016-02-20 DIAGNOSIS — F1920 Other psychoactive substance dependence, uncomplicated: Secondary | ICD-10-CM

## 2016-02-20 DIAGNOSIS — R911 Solitary pulmonary nodule: Secondary | ICD-10-CM | POA: Diagnosis not present

## 2016-02-20 DIAGNOSIS — Z23 Encounter for immunization: Secondary | ICD-10-CM | POA: Diagnosis not present

## 2016-02-20 DIAGNOSIS — M25552 Pain in left hip: Principal | ICD-10-CM

## 2016-02-20 DIAGNOSIS — F17200 Nicotine dependence, unspecified, uncomplicated: Secondary | ICD-10-CM

## 2016-02-20 MED ORDER — HYDROCODONE-ACETAMINOPHEN 7.5-325 MG PO TABS: 1.0000 | ORAL_TABLET | Freq: Four times a day (QID) | ORAL | 0 refills | Status: DC | PRN

## 2016-02-20 MED ORDER — AMLODIPINE BESYLATE 10 MG PO TABS: 10.0000 mg | ORAL_TABLET | Freq: Every day | ORAL | 3 refills | Status: DC

## 2016-02-20 NOTE — Assessment & Plan Note (Signed)
He did not notice any worsening of his symptoms after decreasing his omeprazole. - Stop omeprazole and advised him to watch for his GERD symptoms  It can be restarted with reoccurrence of symptoms.

## 2016-02-20 NOTE — Patient Instructions (Signed)
It was pleasure taking care of you. You have been given 3 month worth of your pain medicine, try to take as less possible as we talked. Please stop taking your omeprazole, you can restart it if your GERD symptoms re occur. I am increasing your BP medicine Norvasc to 10 mg now as your BP remain little high. Please try to quit smoking and decrease your alcohol intake as we talked. Please F/U in 3 months.

## 2016-02-20 NOTE — Assessment & Plan Note (Signed)
He recently saw his oncologist and on repeat CT chest they found multiple small pulmonary nodules and he was instructed to f/u again within 6 month for repeat CT.He denies any cough, fever, night sweats, recent change in his appetite and weight. He still smokes about 1/2 pack/day and  still not interested in quitting. -repeat CT chest as advised by his oncologist.

## 2016-02-20 NOTE — Assessment & Plan Note (Signed)
His ulcer has healed  and he denies any more pain, do not want to f/u with vascular surgeon.

## 2016-02-20 NOTE — Assessment & Plan Note (Signed)
He still smokes about 1/2 pack/day and consume about 1/2 pint of Jen daily, he is still not interested in quitting. We discuss in detail about the dangers of smoking esp. With development of new pulmonary nodule with h/o lung cancer.

## 2016-02-20 NOTE — Assessment & Plan Note (Signed)
Chronic pain generator is osteoarthritis of the left hip. He uses Norco 7.5 mg up to 4 times daily as needed. He came today for the refill. He is being able to do his daily routine with this pain control.  He occasionally becomes constipated for that he takes miralax or dulcolax PRN. I refilled Norco #120 R-2 for three month.

## 2016-02-20 NOTE — Progress Notes (Signed)
   CC: For F/U and refill of his pain medicine.  HPI:  [name] is a 70 y.o. with PMH as listed below, came to clinic for refill of his Norco, he states that he takes 3-4 pills of Norco/ day and it really help with his hip pain and he is being able to do his routine with out any difficulty then. He occasionally becomes constipated for that he takes miralax or dulcolax PRN. He recently saw his oncologist and on repeat CT chest they found multiple small pulmonary nodules and he was instructed to f/u again within 6 month for repeat CT.He denies any cough, fever, night sweats, recent change in his appetite and weight. He is going for his left cataract surgery next Monday. His toe ulcer has healed and he denies any more pain, do not want to f/u with vascular surgeon. He still smokes about 1/2 pack/day and consume about 1/2 pint of Jen daily, he is still not interested in quitting.  Past Medical History:  Diagnosis Date  . AAA (abdominal aortic aneurysm) (HCC)    noted CT 11/2012 size 3.3 x 3.2 cm  . Alcohol abuse   . Barretts esophagus    bx distal esophagus 2011 neg   . Cataract   . Cataract   . CHF (congestive heart failure) ([place])   . Cholelithiasis   . Diverticulosis of colon   . DJD (degenerative joint disease)    left hip  . GERD (gastroesophageal reflux disease)   . Gout   . Hiatal hernia    2 cm noted on upper endos 2011   . Hx of adenomatous colonic polyps   . Hyperlipidemia   . Hypertension   . Internal hemorrhoid   . 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 Myocardial infarction ([place])    hx of, 2 stents 2004. pt was on Plavix x 3 years then transitioned to ASA  . Non-small cell carcinoma of right lung, stage 1 ([place]) 01/30/2016  . Renal failure, acute (HCC)    hx of 2/2 medication, resolved  . Tobacco abuse   . UTI (lower urinary tract infection)     Review of Systems:  As per HPI.  Physical Exam:  Vitals:   02/20/16 0937  BP: (!) 145/82  Pulse: 65  Temp: 97.7 F (36.5 C)  TempSrc: Oral  SpO2: 96%  Weight: 199 lb 8 oz (90.5 kg)  Height: 6' (1.829 m)   General: Well built, oriented, no acute distress. HEENT: PERRLA, bulging eyes without any erythema or scleral icterus. Lungs; Clear with mildly decreased breath sound at right upper lobe. CV: RRR, no R/M/G Abdomen: Soft, non tender,non distended. BS +ve. Musculoskeletal: No tenderness at hip,Non restricted ROM. Extremities:No edema, no cyanosis,Great toe turned inwards,  right third toe lies under the second, well healed ulcer,no erythema. Pulses: 2+ bilaterally.  Assessment & Plan:   See Encounters Tab for problem based charting.  Patient seen with Dr. Eppie Gibson

## 2016-02-20 NOTE — Assessment & Plan Note (Signed)
His BP was 145/82 today.It was 141/68 on 8/15. Slightly elevated from his goal. He is compliant with his medicine. Plan. -Increase Amlodipine to 10 mg daily.

## 2016-02-22 NOTE — Progress Notes (Signed)
I saw and evaluated the patient.  I personally confirmed the key portions of Dr. Latina Craver history and exam and reviewed pertinent patient test results.  The assessment, diagnosis, and plan were formulated together and I agree with the documentation in the resident's note.

## 2016-02-26 DIAGNOSIS — H25812 Combined forms of age-related cataract, left eye: Secondary | ICD-10-CM | POA: Diagnosis not present

## 2016-02-26 DIAGNOSIS — H2512 Age-related nuclear cataract, left eye: Secondary | ICD-10-CM | POA: Diagnosis not present

## 2016-04-16 DIAGNOSIS — H2511 Age-related nuclear cataract, right eye: Secondary | ICD-10-CM | POA: Diagnosis not present

## 2016-04-22 DIAGNOSIS — H25811 Combined forms of age-related cataract, right eye: Secondary | ICD-10-CM | POA: Diagnosis not present

## 2016-04-22 DIAGNOSIS — H25011 Cortical age-related cataract, right eye: Secondary | ICD-10-CM | POA: Diagnosis not present

## 2016-04-22 DIAGNOSIS — H2511 Age-related nuclear cataract, right eye: Secondary | ICD-10-CM | POA: Diagnosis not present

## 2016-04-22 DIAGNOSIS — H25041 Posterior subcapsular polar age-related cataract, right eye: Secondary | ICD-10-CM | POA: Diagnosis not present

## 2016-05-03 ENCOUNTER — Other Ambulatory Visit: Payer: Self-pay | Admitting: Student in an Organized Health Care Education/Training Program

## 2016-05-14 ENCOUNTER — Other Ambulatory Visit: Payer: Self-pay | Admitting: Student in an Organized Health Care Education/Training Program

## 2016-05-30 IMAGING — CR DG ELBOW COMPLETE 3+V*L*
4 series · 4 of 4 positions shown · non-contrast
Comparison: None.

CLINICAL DATA: Fell.  Persistent elbow pain.

EXAM:
LEFT ELBOW - COMPLETE 3+ VIEW

[x elbow joint ap left]
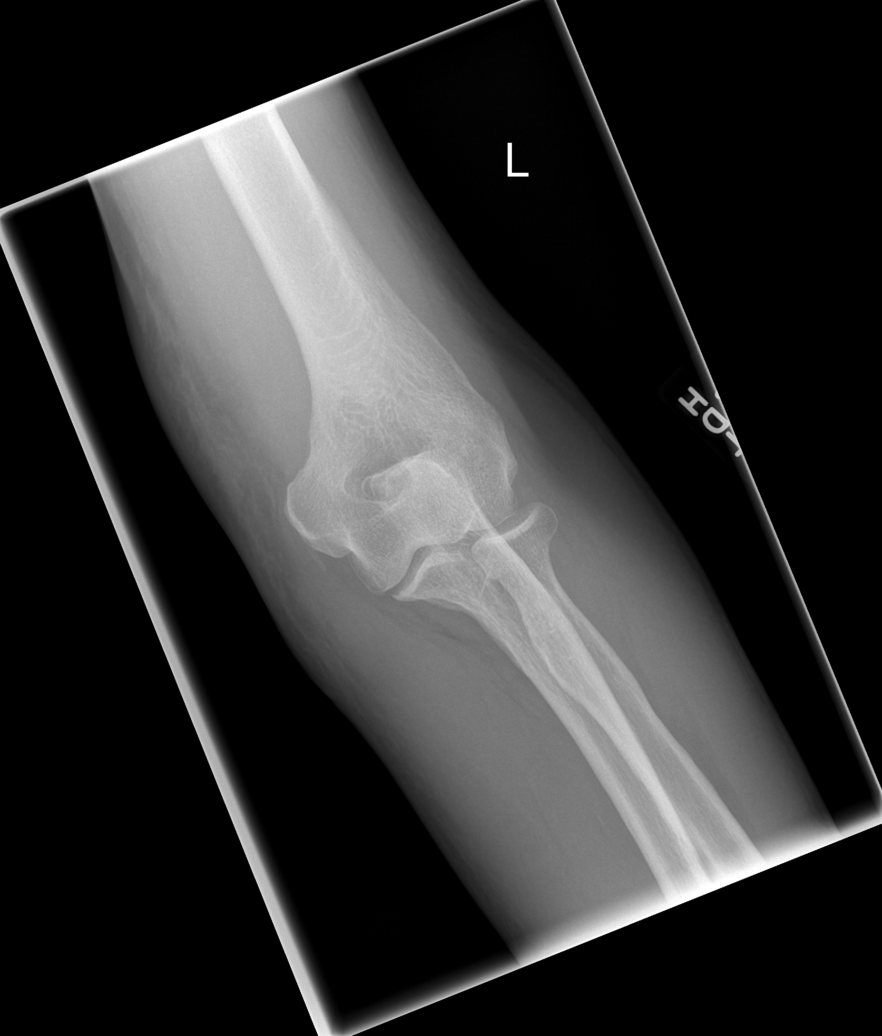

[x elbow joint obl. left (1 of 2)]
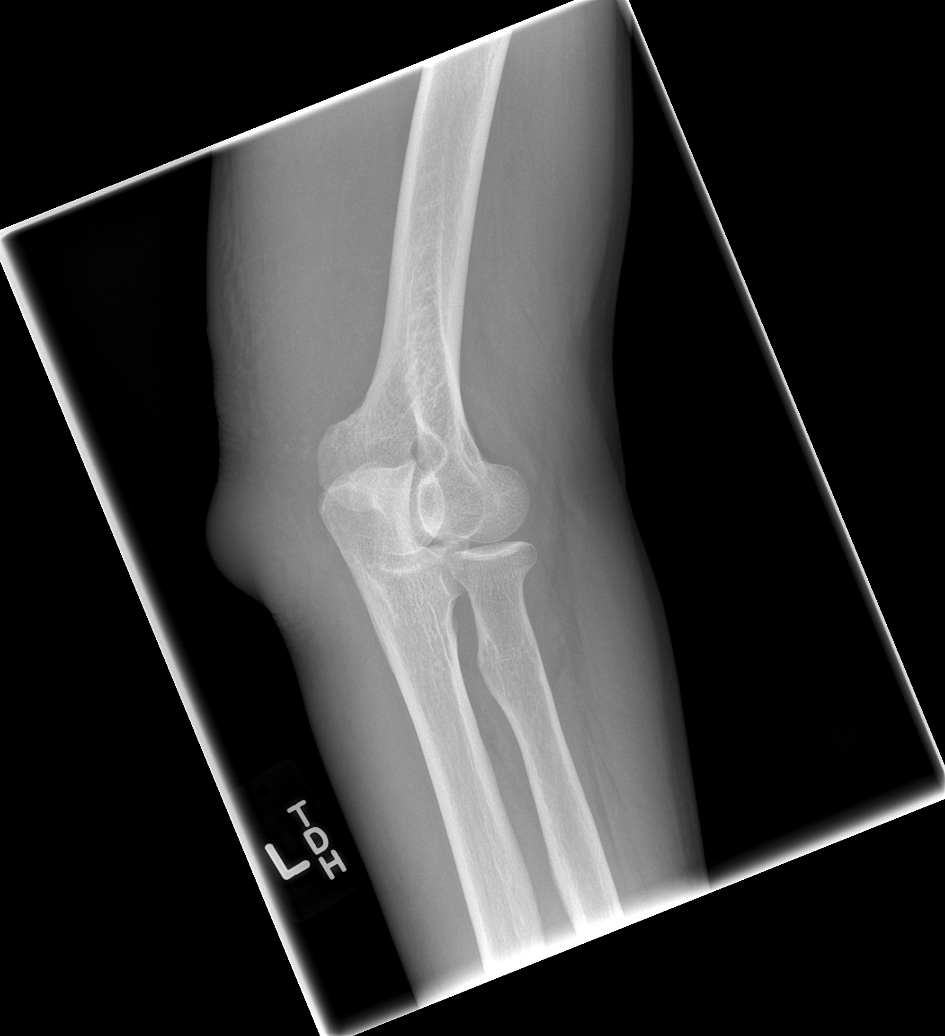

[x elbow joint obl. left (2 of 2)]
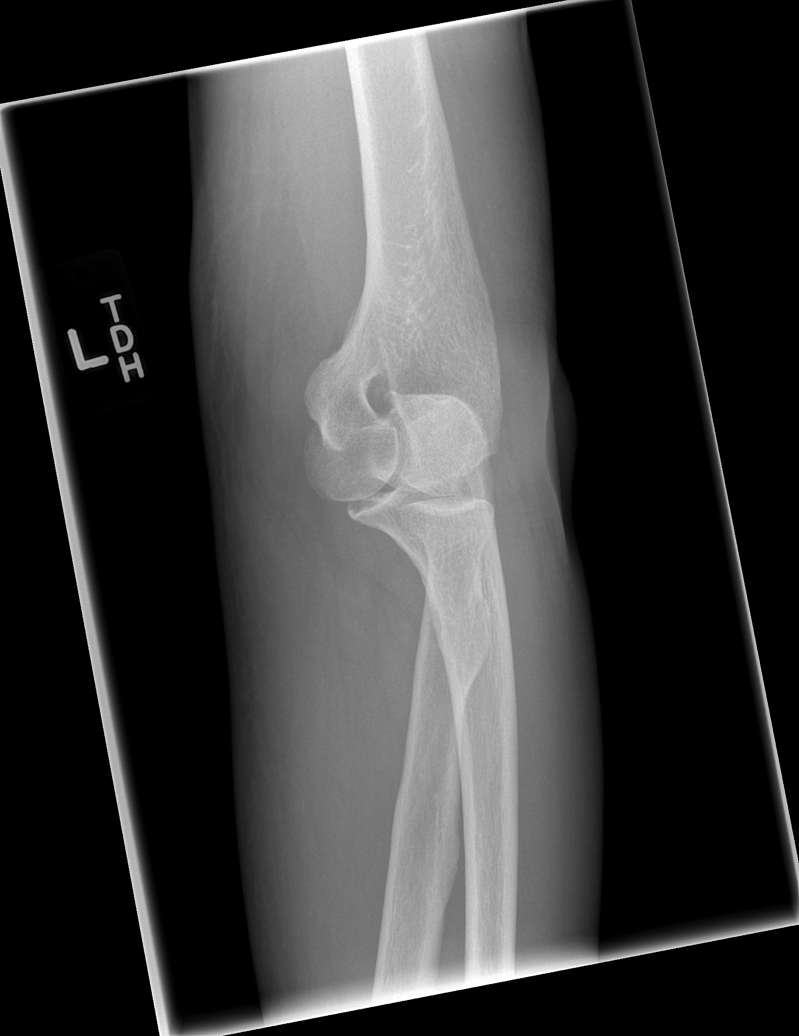

[x elbow joint lat left]
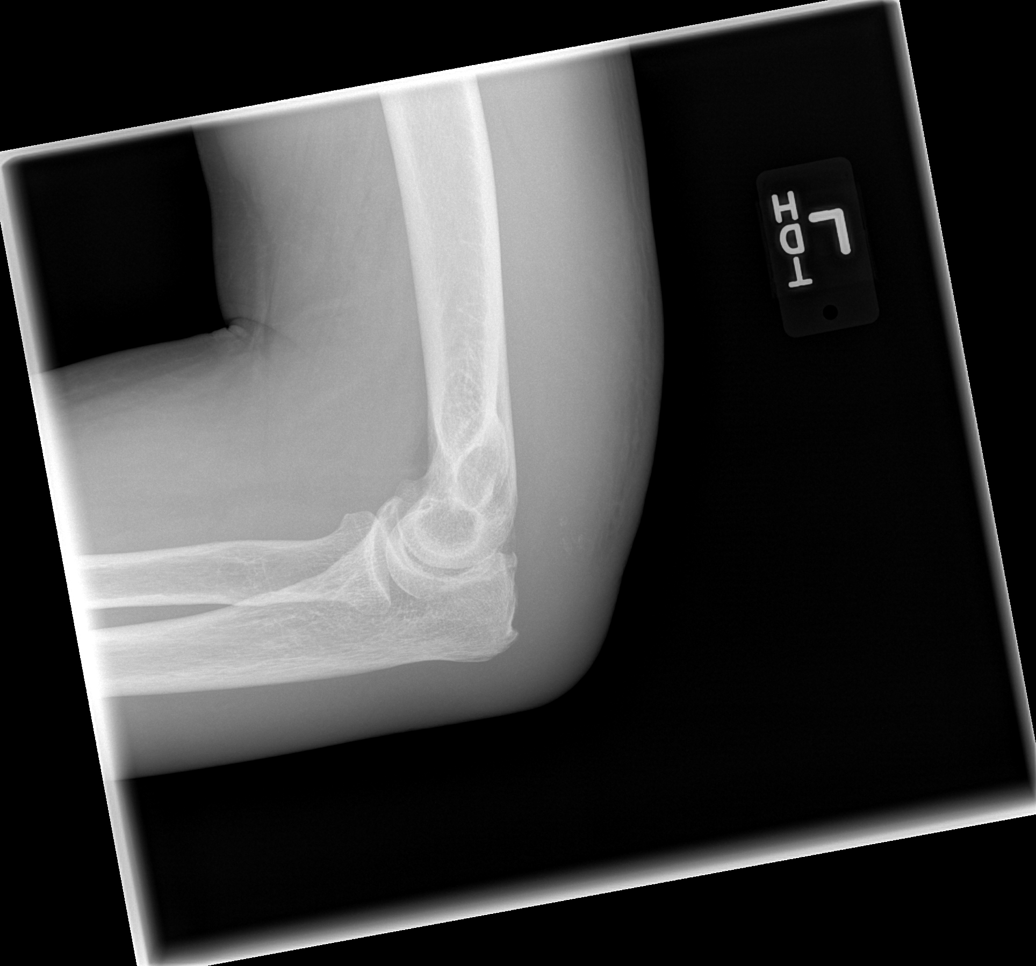

[4 of 4 positions shown; findings below may reference images not displayed]

FINDINGS: The joint spaces are maintained. No acute fracture is identified. No
joint effusion. There is significant soft tissue swelling
posteriorly along the distal humerus and around the olecranon. Small
calcifications are noted approximately 2-1/2 cm from a olecranon.
These could be dystrophic calcifications but findings are somewhat
suspicious for a triceps tendon tear. Recommend correlation with
clinical findings. MRI may be helpful for further evaluation if
clinically indicated.
IMPRESSION: No acute fracture.

Mild elbow joint degenerative changes but no joint effusion.

Significant posterior soft tissue swelling involving the distal arm
and elbow with small calcifications noted in the soft tissues.
Findings suspicious for a triceps tendon tear. Recommend correlation
with clinical findings. MRI may be helpful for further evaluation.

## 2016-06-03 ENCOUNTER — Encounter: Payer: Self-pay | Admitting: Student in an Organized Health Care Education/Training Program

## 2016-06-03 ENCOUNTER — Ambulatory Visit (INDEPENDENT_AMBULATORY_CARE_PROVIDER_SITE_OTHER): Payer: Commercial Managed Care - HMO | Admitting: Student in an Organized Health Care Education/Training Program

## 2016-06-03 ENCOUNTER — Encounter (INDEPENDENT_AMBULATORY_CARE_PROVIDER_SITE_OTHER): Payer: Self-pay

## 2016-06-03 VITALS — BP 110/54 | HR 104 | Temp 97.9°F | Ht 72.0 in | Wt 200.8 lb

## 2016-06-03 DIAGNOSIS — M25552 Pain in left hip: Secondary | ICD-10-CM

## 2016-06-03 DIAGNOSIS — M47816 Spondylosis without myelopathy or radiculopathy, lumbar region: Secondary | ICD-10-CM

## 2016-06-03 DIAGNOSIS — M1612 Unilateral primary osteoarthritis, left hip: Secondary | ICD-10-CM

## 2016-06-03 DIAGNOSIS — B9689 Other specified bacterial agents as the cause of diseases classified elsewhere: Secondary | ICD-10-CM

## 2016-06-03 DIAGNOSIS — I714 Abdominal aortic aneurysm, without rupture: Secondary | ICD-10-CM

## 2016-06-03 DIAGNOSIS — I700 Atherosclerosis of aorta: Secondary | ICD-10-CM

## 2016-06-03 DIAGNOSIS — R319 Hematuria, unspecified: Secondary | ICD-10-CM

## 2016-06-03 DIAGNOSIS — F1920 Other psychoactive substance dependence, uncomplicated: Secondary | ICD-10-CM | POA: Diagnosis not present

## 2016-06-03 DIAGNOSIS — Z79899 Other long term (current) drug therapy: Secondary | ICD-10-CM

## 2016-06-03 DIAGNOSIS — M25551 Pain in right hip: Secondary | ICD-10-CM

## 2016-06-03 DIAGNOSIS — I491 Atrial premature depolarization: Secondary | ICD-10-CM | POA: Diagnosis not present

## 2016-06-03 DIAGNOSIS — F17210 Nicotine dependence, cigarettes, uncomplicated: Secondary | ICD-10-CM

## 2016-06-03 DIAGNOSIS — Z7982 Long term (current) use of aspirin: Secondary | ICD-10-CM

## 2016-06-03 DIAGNOSIS — M86671 Other chronic osteomyelitis, right ankle and foot: Secondary | ICD-10-CM

## 2016-06-03 DIAGNOSIS — R7303 Prediabetes: Secondary | ICD-10-CM

## 2016-06-03 DIAGNOSIS — Z79891 Long term (current) use of opiate analgesic: Secondary | ICD-10-CM

## 2016-06-03 DIAGNOSIS — F17200 Nicotine dependence, unspecified, uncomplicated: Secondary | ICD-10-CM

## 2016-06-03 DIAGNOSIS — I739 Peripheral vascular disease, unspecified: Secondary | ICD-10-CM

## 2016-06-03 DIAGNOSIS — Z87440 Personal history of urinary (tract) infections: Secondary | ICD-10-CM

## 2016-06-03 LAB — POCT GLYCOSYLATED HEMOGLOBIN (HGB A1C): Hemoglobin A1C: 5.9

## 2016-06-03 LAB — GLUCOSE, CAPILLARY: GLUCOSE-CAPILLARY: 97 mg/dL (ref 65–99)

## 2016-06-03 MED ORDER — HYDROCODONE-ACETAMINOPHEN 7.5-325 MG PO TABS: 1.0000 | ORAL_TABLET | Freq: Four times a day (QID) | ORAL | 0 refills | Status: DC | PRN

## 2016-06-03 NOTE — Patient Instructions (Signed)
Tobacco Use Disorder Tobacco use disorder (TUD) is a mental disorder. It is the long-term use of tobacco in spite of related health problems or difficulty with normal life activities. Tobacco is most commonly smoked as cigarettes and less commonly as cigars or pipes. Smokeless chewing tobacco and snuff are also popular. People with TUD get a feeling of extreme pleasure (euphoria) from using tobacco and have a desire to use it again and again. Repeated use of tobacco can cause problems. The addictive effects of tobacco are due mainly tothe ingredient nicotine. Nicotine also causes a rush of adrenaline (epinephrine) in the body. This leads to increased blood pressure, heart rate, and breathing rate. These changes may cause problems for people with high blood pressure, weak hearts, or lung disease. High doses of nicotine in children and pets can lead to seizures and death. Tobacco contains a number of other unsafe chemicals. These chemicals are especially harmful when inhaled as smoke and can damage almost every organ in the body. Smokers live shorter lives than nonsmokers and are at risk of dying from a number of diseases and cancers. Tobacco smoke can also cause health problems for nonsmokers (due to inhaling secondhand smoke). Smoking is also a fire hazard. TUD usually starts in the late teenage years and is most common in young adults between the ages of 77 and 50 years. People who start smoking earlier in life are more likely to continue smoking as adults. TUD is somewhat more common in men than women. People with TUD are at higher risk for using alcohol and other drugs of abuse. What increases the risk? Risk factors for TUD include:  Having family members with the disorder.  Being around people who use tobacco.  Having an existing mental health issue such as schizophrenia, depression, bipolar disorder, ADHD, or posttraumatic stress disorder (PTSD). What are the signs or symptoms? People with  tobacco use disorder have two or more of the following signs and symptoms within 12 months:  Use of more tobacco over a longer period than intended.  Not able to cut down or control tobacco use.  A lot of time spent obtaining or using tobacco.  Strong desire or urge to use tobacco (craving). Cravings may last for 6 months or longer after quitting.  Use of tobacco even when use leads to major problems at work, school, or home.  Use of tobacco even when use leads to relationship problems.  Giving up or cutting down on important life activities because of tobacco use.  Repeatedly using tobacco in situations where it puts you or others in physical danger, like smoking in bed.  Use of tobacco even when it is known that a physical or mental problem is likely related to tobacco use.  Physical problems are numerous and may include chronic bronchitis, emphysema, lung and other cancers, gum disease, high blood pressure, heart disease, and stroke.  Mental problems caused by tobacco may include difficulty sleeping and anxiety.  Need to use greater amounts of tobacco to get the same effect. This means you have developed a tolerance.  Withdrawal symptoms as a result of stopping or rapidly cutting back use. These symptoms may last a month or more after quitting and include the following:  Depressed, anxious, or irritable mood.  Difficulty concentrating.  Increased appetite.  Restlessness or trouble sleeping.  Use of tobacco to avoid withdrawal symptoms. How is this diagnosed? Tobacco use disorder is diagnosed by your health care provider. A diagnosis may be made by:  Your  health care provider asking questions about your tobacco use and any problems it may be causing.  A physical exam.  Lab tests.  You may be referred to a mental health professional or addiction specialist. The severity of tobacco use disorder depends on the number of signs and symptoms you have:  Mild-Two or three  symptoms.  Moderate-Four or five symptoms.  Severe-Six or more symptoms. How is this treated? Many people with tobacco use disorder are unable to quit on their own and need help. Treatment options include the following:  Nicotine replacement therapy (NRT). NRT provides nicotine without the other harmful chemicals in tobacco. NRT gradually lowers the dosage of nicotine in the body and reduces withdrawal symptoms. NRT is available in over-the-counter forms (gum, lozenges, and skin patches) as well as prescription forms (mouth inhaler and nasal spray).  Medicines.This may include:  Antidepressant medicine that may reduce nicotine cravings.  A medicine that acts on nicotine receptors in the brain to reduce cravings and withdrawal symptoms. It may also block the effects of tobacco in people with TUD who relapse.  Counseling or talk therapy. A form of talk therapy called behavioral therapy is commonly used to treat people with TUD. Behavioral therapy looks at triggers for tobacco use, how to avoid them, and how to cope with cravings. It is most effective in person or by phone but is also available in self-help forms (books and Internet websites).  Support groups. These provide emotional support, advice, and guidance for quitting tobacco. The most effective treatment for TUD is usually a combination of medicine, talk therapy, and support groups. Follow these instructions at home:  Keep all follow-up visits as directed by your health care provider. This is important.  Take medicines only as directed by your health care provider.  Check with your health care provider before starting new prescription or over-the-counter medicines. Contact a health care provider if:  You are not able to take your medicines as prescribed.  Treatment is not helping your TUD and your symptoms get worse. Get help right away if:  You have serious thoughts about hurting yourself or others.  You have trouble  breathing, chest pain, sudden weakness, or sudden numbness in part of your body. This information is not intended to replace advice given to you by your health care provider. Make sure you discuss any questions you have with your health care provider. Document Released: 02/07/2004 Document Revised: 02/04/2016 Document Reviewed: 07/30/2013 Elsevier Interactive Patient Education  2017 [employer].

## 2016-06-03 NOTE — Assessment & Plan Note (Signed)
Smokes about a third a pack per day. Actively trying to reduce the amount of cigarettes he smokes. He is unsure if he'll be able to completely quit tobacco. We talked briefly about nicotine replacement therapy. I spent greater than 3 minutes counseling on the importance of tobacco cessation especially given his worsening vasculopathy.

## 2016-06-03 NOTE — Assessment & Plan Note (Signed)
Noted on last urinalysis, but he had a urinary tract infection with Proteus at the time. This was treated with antibiotics. He's had no further gross hematuria. He is high risk for urothelial malignancy given his tobacco use. Plan to recheck urinalysis today, if it is still positive for microscopic hematuria we will refer him back to urology for cystoscopy.

## 2016-06-03 NOTE — Assessment & Plan Note (Signed)
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 He uses hydrocodone to improve functional status, as uses medication for many years. No adverse side effects so far. No recent falls. I checked the control database today which shows appropriate dispensing. I advised him to please make sure he follows up with me and an office visit every 3 months. Urine tox sure was obtained today, reported last dose of hydrocodone was this morning. I provided him with printed prescription of hydrocodone-acetaminophen 7.5-325 mg, 1 tab every 6 hours, #120 tabs for 30 days, 3 month supply.

## 2016-06-03 NOTE — Assessment & Plan Note (Signed)
A1c 6.1% in July 2016. He has a healthy weight, but diabetes would be high risk given his vascular disease. We rechecked an A1c today and it was 5.9%. Plan to check A1c at least once a year.

## 2016-06-03 NOTE — Assessment & Plan Note (Signed)
Premature atrial complexes seen at last visit ECG. Heart he this morning initially elevated with ambulation but then down to 65 with rest. Plan to continue metoprolol 50 mg twice a day. No presyncope symptoms.

## 2016-06-03 NOTE — Assessment & Plan Note (Signed)
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 This is worsened by ongoing tobacco use. Medically were treating him with aspirin and rosuvastatin and plan to continue.

## 2016-06-03 NOTE — Progress Notes (Signed)
Assessment and Plan:  See Encounters tab for problem-based medical decision making.   __________________________________________________________  HPI:  70 year old man here for follow-up of left hip and lower back pain. He's had pain related to arthritis for many years and uses hydrocodone 4 times daily. He reports good benefit with this medication. He says it keeps him active and able to walk. He goes to the gym a couple times a week. Denies any adverse side effects. No recent falls. Reports good compliance with his other medications. No recent gout flares. Reports that his weight is stable. Denies hematuria, no urinary frequency or urgency. He had one urinary tract infection over a year ago with no further episodes. He lives by himself. He drinks alcohol moderately.  __________________________________________________________  Problem List: Patient Active Problem List   Diagnosis Date Noted  . Non-small cell carcinoma of right lung, stage 1 ([place]) 01/30/2016    Priority: High  . 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 Coronary artery disease of native artery of native heart with stable angina pectoris ([place]) 03/17/2012    Priority: High  . Tobacco use disorder 04/18/2006    Priority: High  . Atherosclerosis of aorta ([place]) 01/13/2015    Priority: Medium  . Prediabetes 12/30/2014    Priority: Medium  . AAA (abdominal aortic aneurysm) ([place])     Priority: Medium  . Chronic kidney disease 08/07/2007    Priority: Medium  . Essential hypertension 04/18/2006    Priority: Medium  . GERD 04/18/2006    Priority: Medium  . Premature atrial contractions 07/31/2015    Priority: Low  . Healthcare maintenance 06/30/2012    Priority: Low  . Erectile dysfunction 10/12/2010    Priority: Low  . Arthropathy of pelvic region and thigh 05/02/2008    Priority: Low  . Personal history of colonic polyps 04/18/2006    Priority: Low  . Gout 04/18/2006    Priority: Low  .  Hematuria 03/02/2015    Medications: Reconciled today in Epic __________________________________________________________  Physical Exam:  Vital Signs: Vitals:   06/03/16 0958  BP: (!) 110/54  Pulse: (!) 104  Temp: 97.9 F (36.6 C)  TempSrc: Oral  SpO2: 100%  Weight: 200 lb 12.8 oz (91.1 kg)  Height: 6' (1.829 m)    Gen: Well appearing, NAD CV: irregular rhythm, no murmurs Pulm: Normal effort, CTA throughout, no wheezing Abd: Soft, NT, ND, normal BS.  Ext: Warm, no edema, mild crepitus in both knees Skin: No atypical appearing moles. No rashes

## 2016-06-12 LAB — URINALYSIS, COMPLETE
Bilirubin, UA: NEGATIVE
GLUCOSE, UA: NEGATIVE
KETONES UA: NEGATIVE
Nitrite, UA: NEGATIVE
PROTEIN UA: NEGATIVE
RBC, UA: NEGATIVE
Specific Gravity, UA: 1.013 (ref 1.005–1.030)
UUROB: 0.2 mg/dL (ref 0.2–1.0)
pH, UA: 5 (ref 5.0–7.5)

## 2016-06-12 LAB — MICROSCOPIC EXAMINATION: Casts: NONE SEEN /lpf

## 2016-06-12 LAB — TOXASSURE SELECT,+ANTIDEPR,UR

## 2016-06-17 HISTORY — PX: OTHER SURGICAL HISTORY: SHX169

## 2016-06-17 IMAGING — CT CT CHEST W/O CM
2 of 4 series · 15 of 36 positions shown, 18 images · non-contrast
Comparison: Chest CT 01/22/2013.

CLINICAL DATA: Shortness of breath. History of lung cancer in 8222.

EXAM:
CT CHEST WITHOUT CONTRAST
TECHNIQUE: Multidetector CT imaging of the chest was performed following the
standard protocol without IV contrast..

[Series 2: rtn chest without st · axial · non-contrast · 0.79mm/px · z∈[-364,-84]mm · 12 of 68 slices shown, 15 images]
[im 6/68  mediastinal]
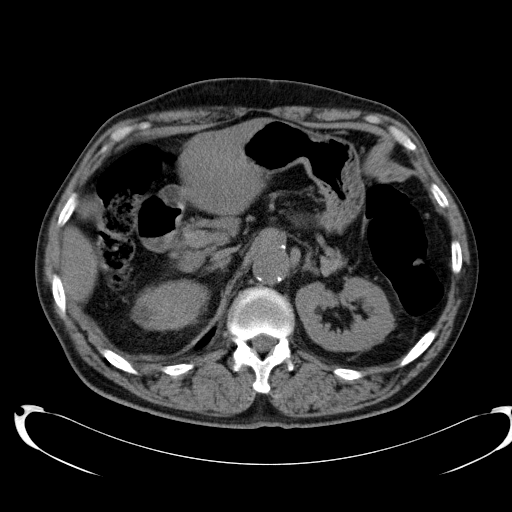
[im 6/68  lung]
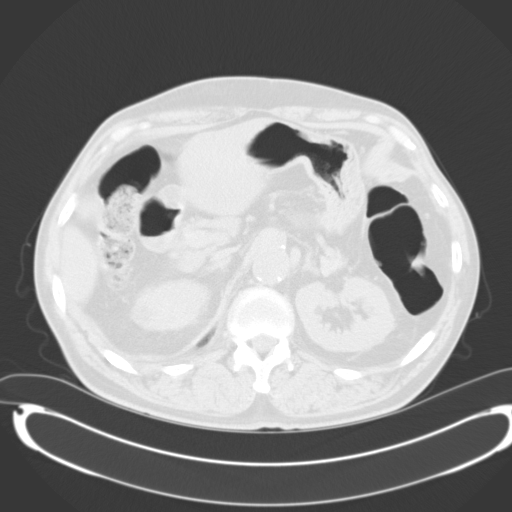
[im 11/68  lung]
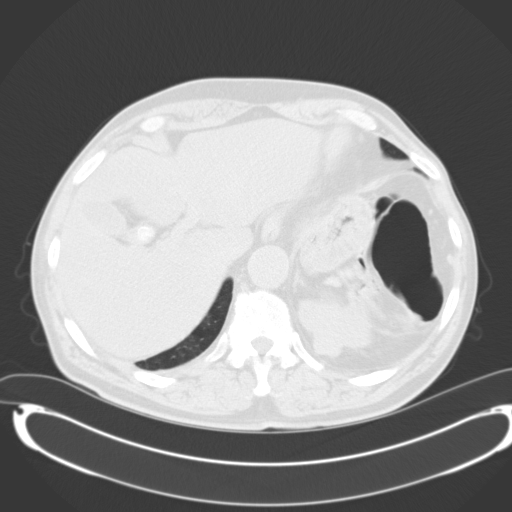
[im 16/68  lung]
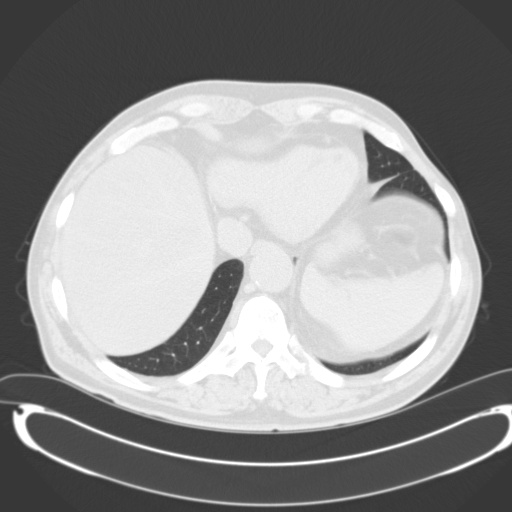
[im 21/68  lung]
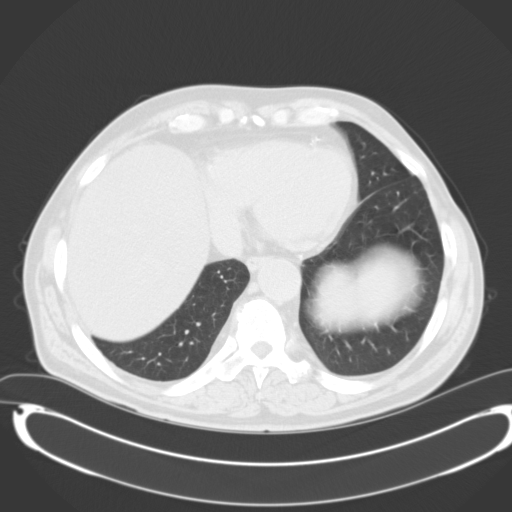
[im 26/68  mediastinal]
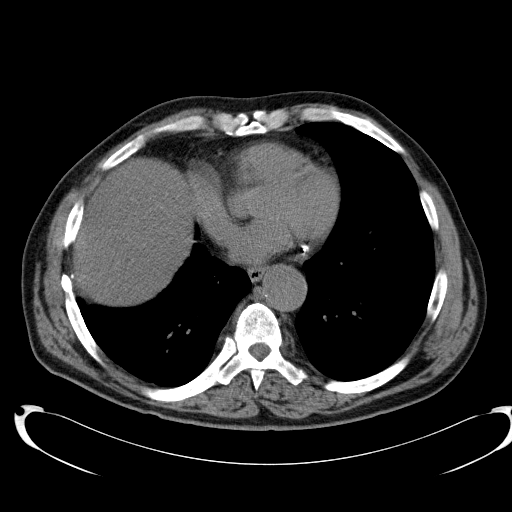
[im 26/68  lung]
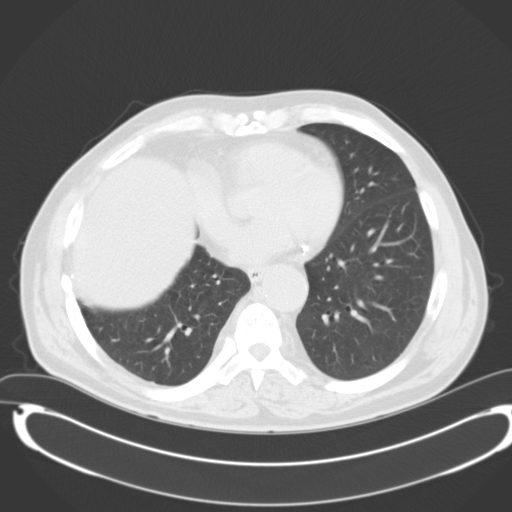
[im 31/68  lung]
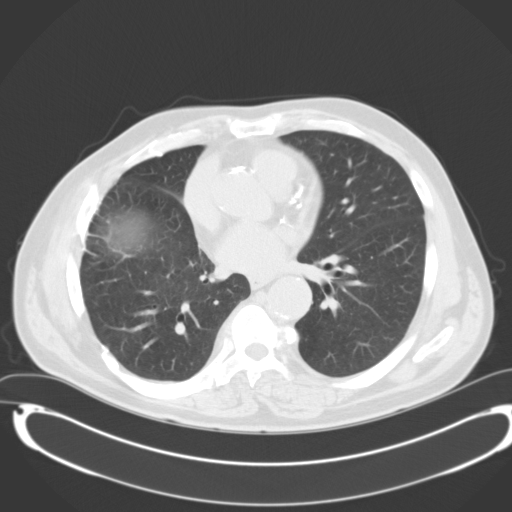
[im 37/68  lung]
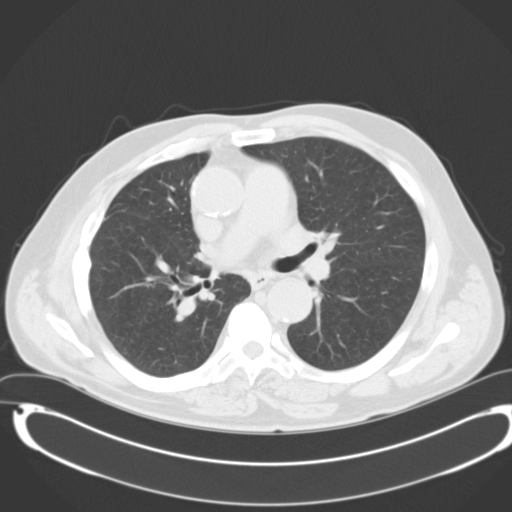
[im 42/68  lung]
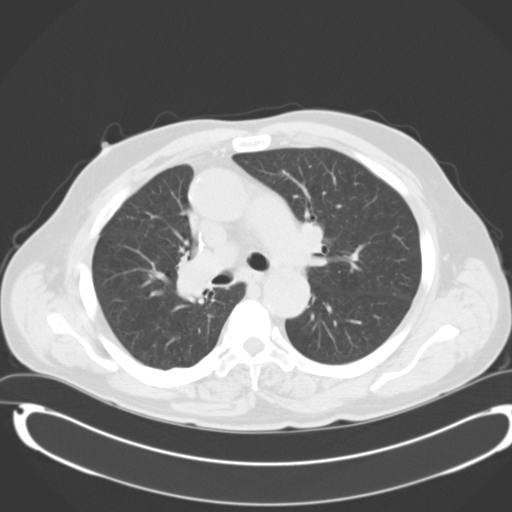
[im 47/68  mediastinal]
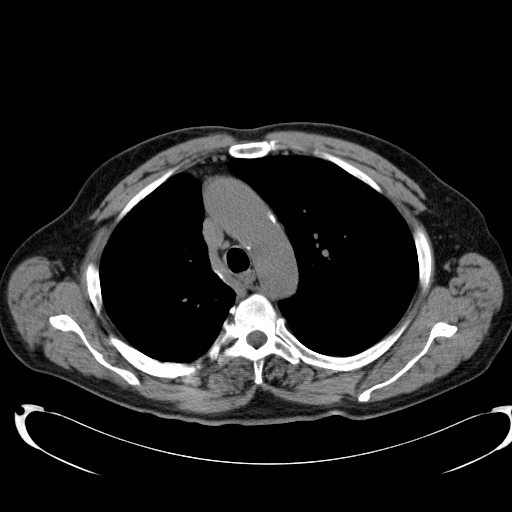
[im 47/68  lung]
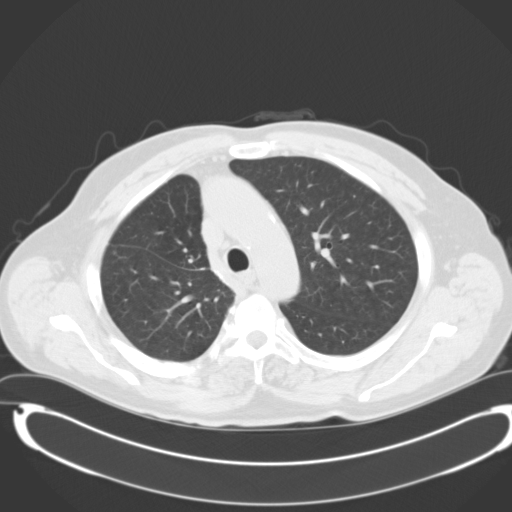
[im 52/68  lung]
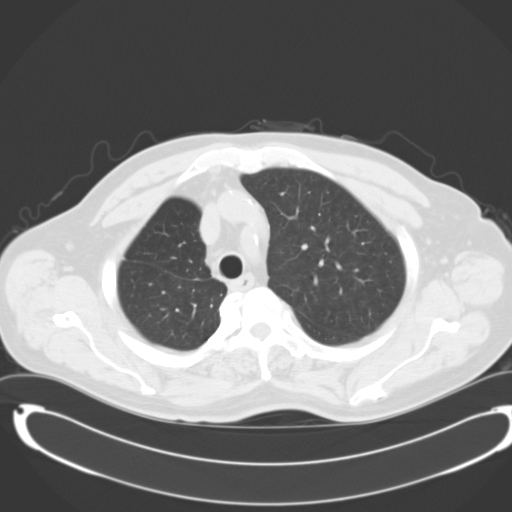
[im 57/68  lung]
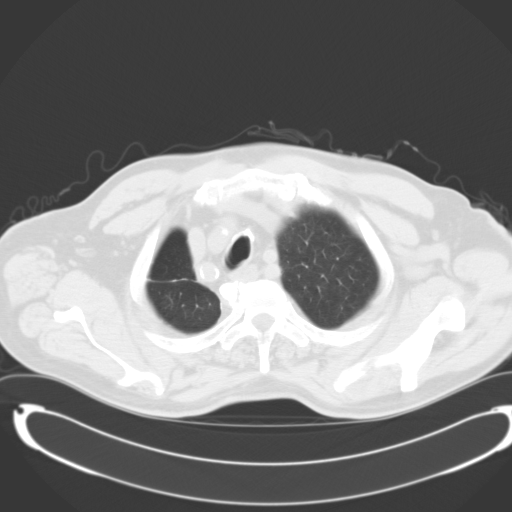
[im 62/68  lung]
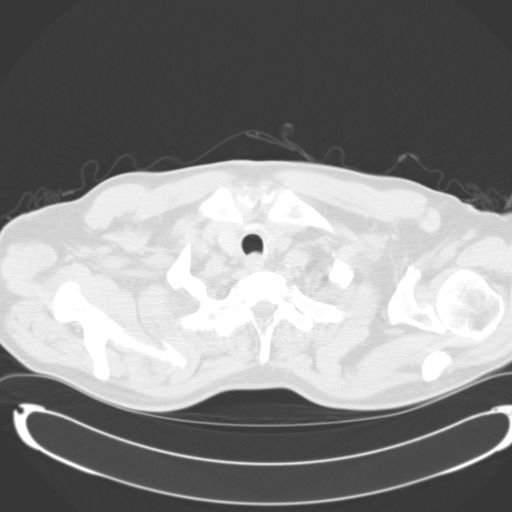

[Series 602: coronal images · coronal · 0.79mm/px · 3 of 79 slices shown]
[im 16/79  lung]
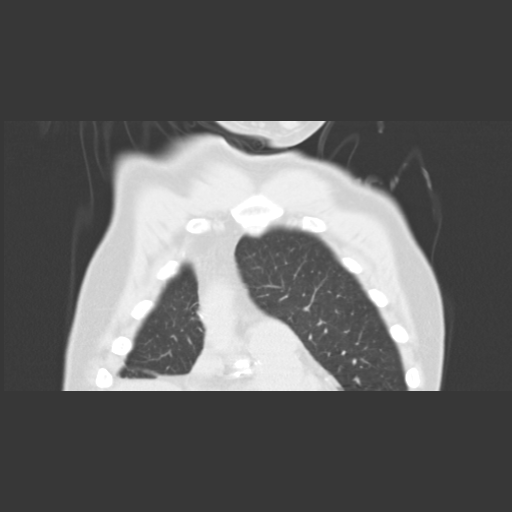
[im 32/79  lung]
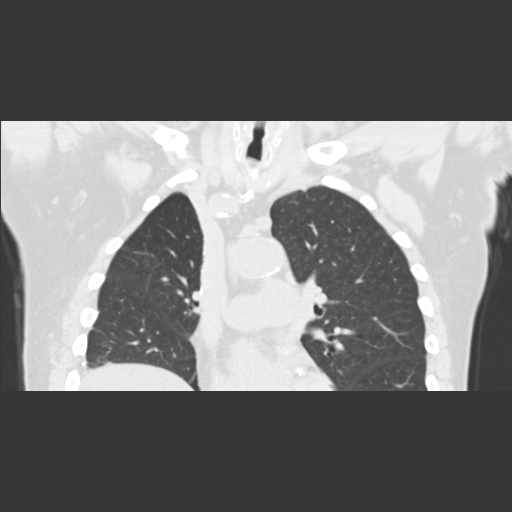
[im 47/79  lung]
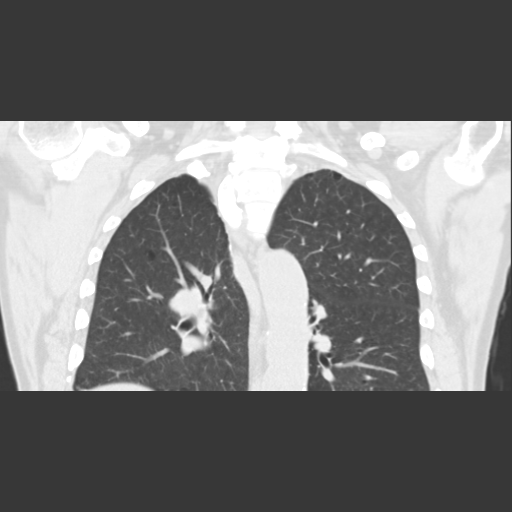

[15 of 36 positions shown; findings below may reference images not displayed]

FINDINGS: The chest wall is unremarkable and stable. No chest wall masses,
supraclavicular or axillary lymphadenopathy. The bony thorax is
stable. There are postoperative changes involving the right ribs
from a prior thoracotomy. Pleural calcifications are also noted. No
acute bony findings or destructive bony changes. The thoracic
vertebral bodies are intact.

The heart is normal in size. No pericardial effusion. Small
scattered mediastinal and hilar lymph nodes but no mass or
adenopathy. The esophagus is grossly normal. Stable aortic and
branch vessel calcifications. Dense 3 vessel coronary artery
calcifications are again noted.

Examination of the lung parenchyma demonstrates stable surgical
changes on the right side. No findings for recurrent tumor or
metastatic pulmonary disease. Emphysematous changes are again noted.
No infiltrates or effusions. No bronchiectasis or interstitial lung
disease.

PE upper abdomen is unremarkable. Stable right renal cysts and
hemorrhagic/hyperdense left upper pole renal cyst. Geographic fatty
infiltration of the liver is demonstrated. Gallstones noted in the
gallbladder.
IMPRESSION: 1. Stable postoperative changes involving the right hemi thorax. No
findings for recurrent tumor, mediastinal/hilar adenopathy or
metastatic pulmonary disease.
2. Stable emphysematous changes.
3. Stable atherosclerotic calcifications involving the aorta and
branch vessels.
4. Stable right renal cyst and hemorrhagic left renal cyst. Stable
gallstones.

## 2016-07-11 ENCOUNTER — Telehealth: Payer: Self-pay | Admitting: Internal Medicine

## 2016-07-11 NOTE — Telephone Encounter (Signed)
sw pt to confirm lab appt prior to CT on 2/12 per LOS

## 2016-07-29 ENCOUNTER — Other Ambulatory Visit (HOSPITAL_BASED_OUTPATIENT_CLINIC_OR_DEPARTMENT_OTHER): Payer: Medicare HMO

## 2016-07-29 ENCOUNTER — Ambulatory Visit (HOSPITAL_COMMUNITY)
Admission: RE | Admit: 2016-07-29 | Discharge: 2016-07-29 | Disposition: A | Discharge status: Home / Self Care | Payer: Medicare HMO | Source: Ambulatory Visit | Attending: Internal Medicine | Admitting: Internal Medicine

## 2016-07-29 ENCOUNTER — Encounter (HOSPITAL_COMMUNITY): Payer: Self-pay

## 2016-07-29 DIAGNOSIS — K760 Fatty (change of) liver, not elsewhere classified: Secondary | ICD-10-CM | POA: Insufficient documentation

## 2016-07-29 DIAGNOSIS — K8020 Calculus of gallbladder without cholecystitis without obstruction: Secondary | ICD-10-CM | POA: Insufficient documentation

## 2016-07-29 DIAGNOSIS — C3491 Malignant neoplasm of unspecified part of right bronchus or lung: Secondary | ICD-10-CM

## 2016-07-29 DIAGNOSIS — I700 Atherosclerosis of aorta: Secondary | ICD-10-CM | POA: Diagnosis not present

## 2016-07-29 DIAGNOSIS — R911 Solitary pulmonary nodule: Secondary | ICD-10-CM | POA: Diagnosis not present

## 2016-07-29 DIAGNOSIS — R918 Other nonspecific abnormal finding of lung field: Secondary | ICD-10-CM | POA: Diagnosis not present

## 2016-07-29 DIAGNOSIS — I2510 Atherosclerotic heart disease of native coronary artery without angina pectoris: Secondary | ICD-10-CM | POA: Diagnosis not present

## 2016-07-29 LAB — CBC WITH DIFFERENTIAL/PLATELET
BASO%: 0.5 % (ref 0.0–2.0)
Basophils Absolute: 0 10*3/uL (ref 0.0–0.1)
EOS%: 2.2 % (ref 0.0–7.0)
Eosinophils Absolute: 0.1 10*3/uL (ref 0.0–0.5)
HCT: 45.1 % (ref 38.4–49.9)
HGB: 14.9 g/dL (ref 13.0–17.1)
LYMPH%: 19.7 % (ref 14.0–49.0)
MCH: 33.3 pg (ref 27.2–33.4)
MCHC: 32.9 g/dL (ref 32.0–36.0)
MCV: 101.1 fL — ABNORMAL HIGH (ref 79.3–98.0)
MONO#: 0.5 10*3/uL (ref 0.1–0.9)
MONO%: 8.4 % (ref 0.0–14.0)
NEUT#: 4.3 10*3/uL (ref 1.5–6.5)
NEUT%: 69.2 % (ref 39.0–75.0)
PLATELETS: 145 10*3/uL (ref 140–400)
RBC: 4.46 10*6/uL (ref 4.20–5.82)
RDW: 14.4 % (ref 11.0–14.6)
WBC: 6.3 10*3/uL (ref 4.0–10.3)
lymph#: 1.2 10*3/uL (ref 0.9–3.3)

## 2016-07-29 LAB — COMPREHENSIVE METABOLIC PANEL
ALT: 29 U/L (ref 0–55)
ANION GAP: 9 meq/L (ref 3–11)
AST: 42 U/L — ABNORMAL HIGH (ref 5–34)
Albumin: 3.7 g/dL (ref 3.5–5.0)
Alkaline Phosphatase: 83 U/L (ref 40–150)
BUN: 13.5 mg/dL (ref 7.0–26.0)
CHLORIDE: 106 meq/L (ref 98–109)
CO2: 28 meq/L (ref 22–29)
CREATININE: 1.1 mg/dL (ref 0.7–1.3)
Calcium: 9.8 mg/dL (ref 8.4–10.4)
EGFR: 75 mL/min/{1.73_m2} — AB (ref >90)
Glucose: 100 mg/dl (ref 70–140)
POTASSIUM: 3.9 meq/L (ref 3.5–5.1)
Sodium: 142 mEq/L (ref 136–145)
Total Bilirubin: 0.87 mg/dL (ref 0.20–1.20)
Total Protein: 7.6 g/dL (ref 6.4–8.3)

## 2016-07-29 MED ORDER — IOPAMIDOL (ISOVUE-300) INJECTION 61%
75.0000 mL | Freq: Once | INTRAVENOUS | Status: AC | PRN
  Administered 2016-07-29: 75 mL via INTRAVENOUS

## 2016-07-29 MED ORDER — SODIUM CHLORIDE 0.9 % IJ SOLN
INTRAMUSCULAR | Status: AC
  Filled 2016-07-29: qty 50

## 2016-07-29 MED ORDER — IOPAMIDOL (ISOVUE-300) INJECTION 61%
INTRAVENOUS | Status: AC
  Filled 2016-07-29: qty 75

## 2016-08-01 ENCOUNTER — Encounter: Payer: Self-pay | Admitting: Internal Medicine

## 2016-08-01 ENCOUNTER — Telehealth: Payer: Self-pay | Admitting: Internal Medicine

## 2016-08-01 ENCOUNTER — Other Ambulatory Visit: Payer: Commercial Managed Care - HMO

## 2016-08-01 ENCOUNTER — Ambulatory Visit (HOSPITAL_BASED_OUTPATIENT_CLINIC_OR_DEPARTMENT_OTHER): Payer: Medicare HMO | Admitting: Internal Medicine

## 2016-08-01 VITALS — BP 145/70 | HR 61 | Temp 97.6°F | Resp 17 | Ht 72.0 in | Wt 200.5 lb

## 2016-08-01 DIAGNOSIS — Z85118 Personal history of other malignant neoplasm of bronchus and lung: Secondary | ICD-10-CM | POA: Diagnosis not present

## 2016-08-01 DIAGNOSIS — R911 Solitary pulmonary nodule: Secondary | ICD-10-CM | POA: Diagnosis not present

## 2016-08-01 DIAGNOSIS — C3491 Malignant neoplasm of unspecified part of right bronchus or lung: Secondary | ICD-10-CM

## 2016-08-01 DIAGNOSIS — R0609 Other forms of dyspnea: Secondary | ICD-10-CM | POA: Diagnosis not present

## 2016-08-01 NOTE — Progress Notes (Signed)
[facility] [phone]   [phone]  OFFICE PROGRESS NOTE  Axel Filler, MD [place] [employer] [place] [address]  DIAGNOSIS: Stage IB (T2, N0, M0) non-small cell lung cancer diagnosed in November of 2007   PRIOR THERAPY: Status post right upper lobectomy with mediastinal lymph node dissection under the care of Dr. Roxan Hockey on 04/28/2006. The tumor size was 8 CM but the patient refused adjuvant chemotherapy at that time.   CURRENT THERAPY: Observation.  INTERVAL HISTORY: [name] 71 y.o. male came to the clinic today for follow-up visit. The patient is feeling fine today was no specific complaints except for shortness of breath with exertion. He denied having any chest pain, cough or hemoptysis. He has no fever or chills. He denied having any weight loss or night sweats. The patient was noticed on previous CT scan of the chest to have small bilateral pulmonary nodules. I repeated his scan and he is here today for evaluation and discussion of his scan results and recommendation regarding his condition.   MEDICAL HISTORY: Past Medical History:  Diagnosis Date  . AAA (abdominal aortic aneurysm) ([facility])    noted CT 11/2012 size 3.3 x 3.2 cm  . Alcohol abuse   . Barretts esophagus    bx distal esophagus 2011 neg   . Cataract   . Cataract   . CHF (congestive heart failure) ([place])   . Cholelithiasis   . Diverticulosis of colon   . DJD (degenerative joint disease)    left hip  . GERD (gastroesophageal reflux disease)   . Gout   . Hiatal hernia    2 cm noted on upper endos 2011   . Hx of adenomatous colonic polyps   . Hyperlipidemia   . Hypertension   . Internal hemorrhoid   . 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 Myocardial infarction    hx of, 2 stents 2004. pt was on Plavix x 3 years then transitioned to ASA  . Non-small cell carcinoma of right lung, stage 1 ([place])  01/30/2016  . Renal failure, acute ([facility])    hx of 2/2 medication, resolved  . Tobacco abuse   . UTI (lower urinary tract infection)     ALLERGIES:  is allergic to candesartan cilexetil-hctz; hydrochlorothiazide; and shellfish allergy.  MEDICATIONS:  Current Outpatient Prescriptions  Medication Sig Dispense Refill  . allopurinol (ZYLOPRIM) 100 MG tablet TAKE 1 TABLET EVERY DAY 90 tablet 3  . amLODipine (NORVASC) 10 MG tablet Take 1 tablet (10 mg total) by mouth daily. 90 tablet 3  . aspirin EC 81 MG tablet Take 81 mg by mouth daily.    . clotrimazole (LOTRIMIN) 1 % cream     . HYDROcodone-acetaminophen (NORCO) 7.5-325 MG tablet Take 1 tablet by mouth every 6 (six) hours as needed for severe pain. 120 tablet 0  . metoprolol (LOPRESSOR) 50 MG tablet Take 1 tablet (50 mg total) by mouth 2 (two) times daily. 180 tablet 3  . Multiple Vitamin (MULTIVITAMIN) tablet Take 1 tablet by mouth daily.    . rosuvastatin (CRESTOR) 20 MG tablet TAKE 1 TABLET EVERY DAY 90 tablet 3  . TOLNAFTATE ANTIFUNGAL 1 % cream      No current facility-administered medications for this visit.     SURGICAL HISTORY:  Past Surgical History:  Procedure Laterality Date  . COLONOSCOPY    . CORONARY ANGIOPLASTY WITH STENT PLACEMENT    .  flexible cystourethroscopy  05/24/06  . INGUINAL HERNIA REPAIR  2005  . LUNG LOBECTOMY Right 11/07   RUL  . UPPER GASTROINTESTINAL ENDOSCOPY      REVIEW OF SYSTEMS:  A comprehensive review of systems was negative except for: Respiratory: positive for dyspnea on exertion   PHYSICAL EXAMINATION: General appearance: alert, cooperative and no distress Head: Normocephalic, without obvious abnormality, atraumatic Neck: no adenopathy Lymph nodes: Cervical, supraclavicular, and axillary nodes normal. Resp: clear to auscultation bilaterally Back: symmetric, no curvature. ROM normal. No CVA tenderness. Cardio: regular rate and rhythm, S1, S2 normal, no murmur, click, rub or gallop GI:  soft, non-tender; bowel sounds normal; no masses,  no organomegaly Extremities: extremities normal, atraumatic, no cyanosis or edema  ECOG PERFORMANCE STATUS: 1 - Symptomatic but completely ambulatory  Blood pressure (!) 145/70, pulse 61, temperature 97.6 F (36.4 C), temperature source Oral, resp. rate 17, height 6' (1.829 m), weight 200 lb 8 oz (90.9 kg), SpO2 93 %.  LABORATORY DATA: Lab Results  Component Value Date   WBC 6.3 07/29/2016   HGB 14.9 07/29/2016   HCT 45.1 07/29/2016   MCV 101.1 (H) 07/29/2016   PLT 145 07/29/2016      Chemistry      Component Value Date/Time   NA 142 07/29/2016 1022   K 3.9 07/29/2016 1022   CL 106 03/02/2015 1137   CL 101 01/25/2011 0901   CO2 28 07/29/2016 1022   BUN 13.5 07/29/2016 1022   CREATININE 1.1 07/29/2016 1022      Component Value Date/Time   CALCIUM 9.8 07/29/2016 1022   ALKPHOS 83 07/29/2016 1022   AST 42 (H) 07/29/2016 1022   ALT 29 07/29/2016 1022   BILITOT 0.87 07/29/2016 1022       RADIOGRAPHIC STUDIES: Ct Chest W Contrast  Result Date: 07/29/2016 CLINICAL DATA:  71 year old male with history of lung cancer originally diagnosed in 2007. Followup study. EXAM: CT CHEST WITH CONTRAST TECHNIQUE: Multidetector CT imaging of the chest was performed during intravenous contrast administration. CONTRAST:  79m ISOVUE-300 IOPAMIDOL (ISOVUE-300) INJECTION 61% COMPARISON:  Multiple priors, most recently chest CT 01/26/2016. FINDINGS: Cardiovascular: Heart size is normal. There is no significant pericardial fluid, thickening or pericardial calcification. There is aortic atherosclerosis, as well as atherosclerosis of the great vessels of the mediastinum and the coronary arteries, including calcified atherosclerotic plaque in the left main, left anterior descending, left circumflex and right coronary arteries. Mediastinum/Nodes: No pathologically enlarged mediastinal or hilar lymph nodes. However, there are several prominent borderline  enlarged bilateral hilar lymph nodes which are nonspecific but conspicuous. Esophagus is unremarkable in appearance. No axillary lymphadenopathy. Lungs/Pleura: Status post right upper lobectomy. Compensatory hyperexpansion of the right middle and lower lobes. There has been an interval increase in number and size of numerous pulmonary nodules compared to the prior examination. The largest of these nodules currently measures 7 x 5 mm (mean diameter of 6 mm) in the right middle lobe (image 50 of series 7). No acute consolidative airspace disease. No pleural effusions. Upper Abdomen: Diffuse low attenuation throughout the visualized hepatic parenchyma, compatible with hepatic steatosis. Calcified gallstones in the neck of the gallbladder. No surrounding inflammatory changes adjacent to the gallbladder to suggest an acute cholecystitis at this time. Low-attenuation lesions in the right kidney, compatible with simple cysts, measuring up to 4.5 cm. Small exophytic intermediate attenuation lesions in the upper pole of the left kidney, similar in size to the prior noncontrast CT examination at which point these were both intermediate  attenuation, likely to represent proteinaceous cysts. Musculoskeletal: Postthoracotomy changes in the right hemithorax. There are no aggressive appearing lytic or blastic lesions noted in the visualized portions of the skeleton. IMPRESSION: 1. Interval increase in number and size of innumerable small pulmonary nodules scattered throughout the lungs bilaterally, concerning for developing metastatic disease. The possibility of infectious nodules is not entirely excluded, however. The largest nodule at this time has a mean diameter of only 6 mm in the right middle lobe (image 50 of series 7). Non-contrast chest CT at 3-6 months is recommended. If the nodules are stable at time of repeat CT, then future CT at 18-24 months (from today's scan) is considered optional for low-risk patients, but is  recommended for high-risk patients. This recommendation follows the consensus statement: Guidelines for Management of Incidental Pulmonary Nodules Detected on CT Images: From the Fleischner Society 2017; Radiology 2017; 284:228-243. 2. Aortic atherosclerosis, in addition to left main and 3 vessel coronary artery disease. Please note that although the presence of coronary artery calcium documents the presence of coronary artery disease, the severity of this disease and any potential stenosis cannot be assessed on this non-gated CT examination. Assessment for potential risk factor modification, dietary therapy or pharmacologic therapy may be warranted, if clinically indicated. 3. Hepatic steatosis. 4. Cholelithiasis. 5. Additional incidental findings, similar prior studies, as above. Electronically Signed   By: Vinnie Langton M.D.   On: 07/29/2016 16:53   ASSESSMENT AND PLAN:  This is a very pleasant 71 years old African-American male with history of stage IB non-small cell lung cancer status post right upper lobectomy with mediastinal lymph node dissection. The patient has been observation since 2007. His recent imaging studies showed increase in the number and size of innumerable small pulmonary nodules. 2 nodules are probably knows the detection level of the PET scan at this point. I recommended for him to continue on observation. I will repeat CT scan of the chest in 3 months for close monitoring of these pulmonary nodules. He was advised to call immediately if he has any concerning symptoms in the interval. The patient voices understanding of current disease status and treatment options and is in agreement with the current care plan.  All questions were answered. The patient knows to call the clinic with any problems, questions or concerns. We can certainly see the patient much sooner if necessary. I spent 10 minutes counseling the patient face to face. The total time spent in the appointment was 15  minutes.  Disclaimer: This note was dictated with voice recognition software. Similar sounding words can inadvertently be transcribed and may be missed upon review.

## 2016-08-01 NOTE — Patient Instructions (Signed)
Steps to Quit Smoking Smoking tobacco can be bad for your health. It can also affect almost every organ in your body. Smoking puts you and people around you at risk for many serious long-lasting (chronic) diseases. Quitting smoking is hard, but it is one of the best things that you can do for your health. It is never too late to quit. What are the benefits of quitting smoking? When you quit smoking, you lower your risk for getting serious diseases and conditions. They can include:  Lung cancer or lung disease.  Heart disease.  Stroke.  Heart attack.  Not being able to have children (infertility).  Weak bones (osteoporosis) and broken bones (fractures). If you have coughing, wheezing, and shortness of breath, those symptoms may get better when you quit. You may also get sick less often. If you are pregnant, quitting smoking can help to lower your chances of having a baby of low birth weight. What can I do to help me quit smoking? Talk with your doctor about what can help you quit smoking. Some things you can do (strategies) include:  Quitting smoking totally, instead of slowly cutting back how much you smoke over a period of time.  Going to in-person counseling. You are more likely to quit if you go to many counseling sessions.  Using resources and support systems, such as:  Online chats with a counselor.  Phone quitlines.  Printed self-help materials.  Support groups or group counseling.  Text messaging programs.  Mobile phone apps or applications.  Taking medicines. Some of these medicines may have nicotine in them. If you are pregnant or breastfeeding, do not take any medicines to quit smoking unless your doctor says it is okay. Talk with your doctor about counseling or other things that can help you. Talk with your doctor about using more than one strategy at the same time, such as taking medicines while you are also going to in-person counseling. This can help make quitting  easier. What things can I do to make it easier to quit? Quitting smoking might feel very hard at first, but there is a lot that you can do to make it easier. Take these steps:  Talk to your family and friends. Ask them to support and encourage you.  Call phone quitlines, reach out to support groups, or work with a counselor.  Ask people who smoke to not smoke around you.  Avoid places that make you want (trigger) to smoke, such as:  Bars.  Parties.  Smoke-break areas at work.  Spend time with people who do not smoke.  Lower the stress in your life. Stress can make you want to smoke. Try these things to help your stress:  Getting regular exercise.  Deep-breathing exercises.  Yoga.  Meditating.  Doing a body scan. To do this, close your eyes, focus on one area of your body at a time from head to toe, and notice which parts of your body are tense. Try to relax the muscles in those areas.  Download or buy apps on your mobile phone or tablet that can help you stick to your quit plan. There are many free apps, such as QuitGuide from the CDC (Centers for Disease Control and Prevention). You can find more support from smokefree.gov and other websites. This information is not intended to replace advice given to you by your health care provider. Make sure you discuss any questions you have with your health care provider. Document Released: 03/30/2009 Document Revised: 01/30/2016 Document   Reviewed: 10/18/2014 Elsevier Interactive Patient Education  2017 Elsevier Inc.  

## 2016-08-01 NOTE — Telephone Encounter (Signed)
Gave patient avs report and appointments for May. Central radiology will call re scan.

## 2016-09-05 ENCOUNTER — Encounter: Payer: Self-pay | Admitting: Student in an Organized Health Care Education/Training Program

## 2016-09-09 ENCOUNTER — Ambulatory Visit (INDEPENDENT_AMBULATORY_CARE_PROVIDER_SITE_OTHER): Payer: Medicare HMO | Admitting: Student in an Organized Health Care Education/Training Program

## 2016-09-09 ENCOUNTER — Encounter (INDEPENDENT_AMBULATORY_CARE_PROVIDER_SITE_OTHER): Payer: Self-pay

## 2016-09-09 ENCOUNTER — Encounter: Payer: Self-pay | Admitting: Student in an Organized Health Care Education/Training Program

## 2016-09-09 VITALS — BP 109/55 | HR 59 | Temp 97.7°F | Ht 72.0 in | Wt 203.4 lb

## 2016-09-09 DIAGNOSIS — L720 Epidermal cyst: Secondary | ICD-10-CM

## 2016-09-09 DIAGNOSIS — M47816 Spondylosis without myelopathy or radiculopathy, lumbar region: Secondary | ICD-10-CM | POA: Diagnosis not present

## 2016-09-09 DIAGNOSIS — Z79899 Other long term (current) drug therapy: Secondary | ICD-10-CM

## 2016-09-09 DIAGNOSIS — I714 Abdominal aortic aneurysm, without rupture, unspecified: Secondary | ICD-10-CM

## 2016-09-09 DIAGNOSIS — M25551 Pain in right hip: Secondary | ICD-10-CM

## 2016-09-09 DIAGNOSIS — F17210 Nicotine dependence, cigarettes, uncomplicated: Secondary | ICD-10-CM

## 2016-09-09 DIAGNOSIS — M25552 Pain in left hip: Secondary | ICD-10-CM | POA: Diagnosis not present

## 2016-09-09 DIAGNOSIS — G8929 Other chronic pain: Secondary | ICD-10-CM

## 2016-09-09 DIAGNOSIS — I10 Essential (primary) hypertension: Secondary | ICD-10-CM | POA: Diagnosis not present

## 2016-09-09 DIAGNOSIS — K5900 Constipation, unspecified: Secondary | ICD-10-CM

## 2016-09-09 DIAGNOSIS — Z79891 Long term (current) use of opiate analgesic: Secondary | ICD-10-CM

## 2016-09-09 DIAGNOSIS — F17200 Nicotine dependence, unspecified, uncomplicated: Secondary | ICD-10-CM

## 2016-09-09 DIAGNOSIS — Z86010 Personal history of colonic polyps: Secondary | ICD-10-CM

## 2016-09-09 MED ORDER — HYDROCODONE-ACETAMINOPHEN 7.5-325 MG PO TABS: 1.0000 | ORAL_TABLET | Freq: Four times a day (QID) | ORAL | 0 refills | Status: DC | PRN

## 2016-09-09 NOTE — Assessment & Plan Note (Signed)
Blood pressure is well controlled today. Plan to continue amlodipine and metoprolol.

## 2016-09-09 NOTE — Patient Instructions (Signed)
Tobacco Use Disorder Tobacco use disorder (TUD) is a mental disorder. It is the long-term use of tobacco in spite of related health problems or difficulty with normal life activities. Tobacco is most commonly smoked as cigarettes and less commonly as cigars or pipes. Smokeless chewing tobacco and snuff are also popular. People with TUD get a feeling of extreme pleasure (euphoria) from using tobacco and have a desire to use it again and again. Repeated use of tobacco can cause problems. The addictive effects of tobacco are due mainly tothe ingredient nicotine. Nicotine also causes a rush of adrenaline (epinephrine) in the body. This leads to increased blood pressure, heart rate, and breathing rate. These changes may cause problems for people with high blood pressure, weak hearts, or lung disease. High doses of nicotine in children and pets can lead to seizures and death. Tobacco contains a number of other unsafe chemicals. These chemicals are especially harmful when inhaled as smoke and can damage almost every organ in the body. Smokers live shorter lives than nonsmokers and are at risk of dying from a number of diseases and cancers. Tobacco smoke can also cause health problems for nonsmokers (due to inhaling secondhand smoke). Smoking is also a fire hazard. TUD usually starts in the late teenage years and is most common in young adults between the ages of 78 and 83 years. People who start smoking earlier in life are more likely to continue smoking as adults. TUD is somewhat more common in men than women. People with TUD are at higher risk for using alcohol and other drugs of abuse. What increases the risk? Risk factors for TUD include:  Having family members with the disorder.  Being around people who use tobacco.  Having an existing mental health issue such as schizophrenia, depression, bipolar disorder, ADHD, or posttraumatic stress disorder (PTSD). What are the signs or symptoms? People with  tobacco use disorder have two or more of the following signs and symptoms within 12 months:  Use of more tobacco over a longer period than intended.  Not able to cut down or control tobacco use.  A lot of time spent obtaining or using tobacco.  Strong desire or urge to use tobacco (craving). Cravings may last for 6 months or longer after quitting.  Use of tobacco even when use leads to major problems at work, school, or home.  Use of tobacco even when use leads to relationship problems.  Giving up or cutting down on important life activities because of tobacco use.  Repeatedly using tobacco in situations where it puts you or others in physical danger, like smoking in bed.  Use of tobacco even when it is known that a physical or mental problem is likely related to tobacco use.  Physical problems are numerous and may include chronic bronchitis, emphysema, lung and other cancers, gum disease, high blood pressure, heart disease, and stroke.  Mental problems caused by tobacco may include difficulty sleeping and anxiety.  Need to use greater amounts of tobacco to get the same effect. This means you have developed a tolerance.  Withdrawal symptoms as a result of stopping or rapidly cutting back use. These symptoms may last a month or more after quitting and include the following:  Depressed, anxious, or irritable mood.  Difficulty concentrating.  Increased appetite.  Restlessness or trouble sleeping.  Use of tobacco to avoid withdrawal symptoms. How is this diagnosed? Tobacco use disorder is diagnosed by your health care provider. A diagnosis may be made by:  Your  health care provider asking questions about your tobacco use and any problems it may be causing.  A physical exam.  Lab tests.  You may be referred to a mental health professional or addiction specialist. The severity of tobacco use disorder depends on the number of signs and symptoms you have:  Mild-Two or three  symptoms.  Moderate-Four or five symptoms.  Severe-Six or more symptoms. How is this treated? Many people with tobacco use disorder are unable to quit on their own and need help. Treatment options include the following:  Nicotine replacement therapy (NRT). NRT provides nicotine without the other harmful chemicals in tobacco. NRT gradually lowers the dosage of nicotine in the body and reduces withdrawal symptoms. NRT is available in over-the-counter forms (gum, lozenges, and skin patches) as well as prescription forms (mouth inhaler and nasal spray).  Medicines.This may include:  Antidepressant medicine that may reduce nicotine cravings.  A medicine that acts on nicotine receptors in the brain to reduce cravings and withdrawal symptoms. It may also block the effects of tobacco in people with TUD who relapse.  Counseling or talk therapy. A form of talk therapy called behavioral therapy is commonly used to treat people with TUD. Behavioral therapy looks at triggers for tobacco use, how to avoid them, and how to cope with cravings. It is most effective in person or by phone but is also available in self-help forms (books and Internet websites).  Support groups. These provide emotional support, advice, and guidance for quitting tobacco. The most effective treatment for TUD is usually a combination of medicine, talk therapy, and support groups. Follow these instructions at home:  Keep all follow-up visits as directed by your health care provider. This is important.  Take medicines only as directed by your health care provider.  Check with your health care provider before starting new prescription or over-the-counter medicines. Contact a health care provider if:  You are not able to take your medicines as prescribed.  Treatment is not helping your TUD and your symptoms get worse. Get help right away if:  You have serious thoughts about hurting yourself or others.  You have trouble  breathing, chest pain, sudden weakness, or sudden numbness in part of your body. This information is not intended to replace advice given to you by your health care provider. Make sure you discuss any questions you have with your health care provider. Document Released: 02/07/2004 Document Revised: 02/04/2016 Document Reviewed: 07/30/2013 Elsevier Interactive Patient Education  2017 [employer].

## 2016-09-09 NOTE — Progress Notes (Signed)
Assessment and Plan:  See Encounters tab for problem-based medical decision making.   __________________________________________________________  HPI:  71 year old man here for follow-up of his chronic hip pain. He has been using Norco 3-4 tablets per day for many years and reports good improvement in his functional status. He says that his needs for the medicine are different each day depending on how much pain he is in. He thinks that the weather changes his pain as well. Denies any depressed mood or anhedonia. Reports good compliance with the medications. No recent falls or other adverse side effects. No fevers or chills. No chest pain, PND, orthopnea. He does have mild dyspnea on exertion with strenuous activity. Currently he is able to vacuum and take care of his house independently. Does not routinely exercise. Continues to smoke 5 cigarettes daily. Drinks beer frequently especially when he gets together with his friends.  __________________________________________________________  Problem List: Patient Active Problem List   Diagnosis Date Noted  . Non-small cell carcinoma of right lung, stage 1 ([place]) 01/30/2016    Priority: High  . Chronic use of opiate for therapeutic purpose 05/01/2015    Priority: High  . Coronary artery disease of native artery of native heart with stable angina pectoris ([place]) 03/17/2012    Priority: High  . Tobacco use disorder 04/18/2006    Priority: High  . Atherosclerosis of aorta ([place]) 01/13/2015    Priority: Medium  . Prediabetes 12/30/2014    Priority: Medium  . AAA (abdominal aortic aneurysm) ([place])     Priority: Medium  . Essential hypertension 04/18/2006    Priority: Medium  . GERD 04/18/2006    Priority: Medium  . Premature atrial contractions 07/31/2015    Priority: Low  . Healthcare maintenance 06/30/2012    Priority: Low  . Erectile dysfunction 10/12/2010    Priority: Low  . Arthropathy of pelvic region and thigh 05/02/2008    Priority:  Low  . Personal history of colonic polyps 04/18/2006    Priority: Low  . Gout 04/18/2006    Priority: Low    Medications: Reconciled today in Epic __________________________________________________________  Physical Exam:  Vital Signs: Vitals:   09/09/16 0955  BP: (!) 109/55  Pulse: (!) 59  Temp: 97.7 F (36.5 C)  TempSrc: Oral  SpO2: 98%  Weight: 203 lb 6.4 oz (92.3 kg)  Height: 6' (1.829 m)    Gen: Well appearing, NAD CV: RRR, no murmurs Pulm: Normal effort, CTA throughout, no wheezing Abd: Soft, NT, ND, normal BS.  Ext: Warm, no edema, normal joints Skin: No atypical appearing moles. No rashes. Two non-draining, non-inflamed inclusion cysts on his back.

## 2016-09-09 NOTE — Assessment & Plan Note (Signed)
Smoking about 5 cigarettes per day at this point. He is actively trying to cut down on smoking. I spent 5 minutes counseling him on tobacco cessation techniques. We talked about medication assisted therapy including nicotine patches and lozenges. We identified risk factors including alcohol intake which is often what makes him want to smoke. He is getting continue to work on reducing tobacco use and declined medication assisted therapy for now.

## 2016-09-09 NOTE — Assessment & Plan Note (Addendum)
Chronic pain generator is left hip and lower back osteoarthritis. He has used Norco 3-4 tabs daily for many years with good effect. He continues to feel that it is very helpful for his functional status. Uses MiraLAX for constipation with good effect. No other adverse side effects. I reviewed the controlled substance database which shows appropriate monthly dispensing. U tox in December was fine. I gave him a 3 month refill of Norco, follow-up with me for future refills in a visit.

## 2016-10-21 ENCOUNTER — Other Ambulatory Visit: Payer: Self-pay | Admitting: Internal Medicine

## 2016-10-21 DIAGNOSIS — I25118 Atherosclerotic heart disease of native coronary artery with other forms of angina pectoris: Secondary | ICD-10-CM

## 2016-10-30 ENCOUNTER — Other Ambulatory Visit (HOSPITAL_BASED_OUTPATIENT_CLINIC_OR_DEPARTMENT_OTHER): Payer: Medicare HMO

## 2016-10-30 DIAGNOSIS — R911 Solitary pulmonary nodule: Secondary | ICD-10-CM

## 2016-10-30 DIAGNOSIS — C3491 Malignant neoplasm of unspecified part of right bronchus or lung: Secondary | ICD-10-CM

## 2016-10-30 LAB — CBC WITH DIFFERENTIAL/PLATELET
BASO%: 1 % (ref 0.0–2.0)
BASOS ABS: 0 10*3/uL (ref 0.0–0.1)
EOS ABS: 0.2 10*3/uL (ref 0.0–0.5)
EOS%: 3.7 % (ref 0.0–7.0)
HCT: 44.1 % (ref 38.4–49.9)
HGB: 14.4 g/dL (ref 13.0–17.1)
LYMPH%: 25.3 % (ref 14.0–49.0)
MCH: 33 pg (ref 27.2–33.4)
MCHC: 32.7 g/dL (ref 32.0–36.0)
MCV: 100.8 fL — AB (ref 79.3–98.0)
MONO#: 0.3 10*3/uL (ref 0.1–0.9)
MONO%: 6.9 % (ref 0.0–14.0)
NEUT%: 63.1 % (ref 39.0–75.0)
NEUTROS ABS: 3.1 10*3/uL (ref 1.5–6.5)
PLATELETS: 137 10*3/uL — AB (ref 140–400)
RBC: 4.37 10*6/uL (ref 4.20–5.82)
RDW: 14.8 % — ABNORMAL HIGH (ref 11.0–14.6)
WBC: 5 10*3/uL (ref 4.0–10.3)
lymph#: 1.3 10*3/uL (ref 0.9–3.3)

## 2016-10-30 LAB — COMPREHENSIVE METABOLIC PANEL
ALK PHOS: 91 U/L (ref 40–150)
ALT: 39 U/L (ref 0–55)
ANION GAP: 9 meq/L (ref 3–11)
AST: 58 U/L — ABNORMAL HIGH (ref 5–34)
Albumin: 3.7 g/dL (ref 3.5–5.0)
BILIRUBIN TOTAL: 0.52 mg/dL (ref 0.20–1.20)
BUN: 13.7 mg/dL (ref 7.0–26.0)
CHLORIDE: 111 meq/L — AB (ref 98–109)
CO2: 23 meq/L (ref 22–29)
Calcium: 9.3 mg/dL (ref 8.4–10.4)
Creatinine: 1.1 mg/dL (ref 0.7–1.3)
EGFR: 76 mL/min/{1.73_m2} — AB (ref >90)
GLUCOSE: 104 mg/dL (ref 70–140)
POTASSIUM: 3.8 meq/L (ref 3.5–5.1)
SODIUM: 143 meq/L (ref 136–145)
TOTAL PROTEIN: 7.5 g/dL (ref 6.4–8.3)

## 2016-11-04 ENCOUNTER — Ambulatory Visit (HOSPITAL_COMMUNITY)
Admission: RE | Admit: 2016-11-04 | Discharge: 2016-11-04 | Disposition: A | Discharge status: Home / Self Care | Payer: Medicare HMO | Source: Ambulatory Visit | Attending: Internal Medicine | Admitting: Internal Medicine

## 2016-11-04 DIAGNOSIS — R911 Solitary pulmonary nodule: Secondary | ICD-10-CM | POA: Diagnosis not present

## 2016-11-04 DIAGNOSIS — C3490 Malignant neoplasm of unspecified part of unspecified bronchus or lung: Secondary | ICD-10-CM | POA: Diagnosis not present

## 2016-11-04 DIAGNOSIS — C3491 Malignant neoplasm of unspecified part of right bronchus or lung: Secondary | ICD-10-CM

## 2016-11-04 MED ORDER — IOPAMIDOL (ISOVUE-300) INJECTION 61%
INTRAVENOUS | Status: AC
  Administered 2016-11-04: 100 mL via INTRAVENOUS
  Filled 2016-11-04: qty 100

## 2016-11-06 ENCOUNTER — Encounter: Payer: Self-pay | Admitting: Internal Medicine

## 2016-11-06 ENCOUNTER — Telehealth: Payer: Self-pay | Admitting: Internal Medicine

## 2016-11-06 ENCOUNTER — Ambulatory Visit (HOSPITAL_BASED_OUTPATIENT_CLINIC_OR_DEPARTMENT_OTHER): Payer: Medicare HMO | Admitting: Internal Medicine

## 2016-11-06 VITALS — BP 139/68 | HR 62 | Temp 98.5°F | Resp 18 | Ht 72.0 in | Wt 197.0 lb

## 2016-11-06 DIAGNOSIS — Z85118 Personal history of other malignant neoplasm of bronchus and lung: Secondary | ICD-10-CM

## 2016-11-06 DIAGNOSIS — R918 Other nonspecific abnormal finding of lung field: Secondary | ICD-10-CM | POA: Diagnosis not present

## 2016-11-06 DIAGNOSIS — C3491 Malignant neoplasm of unspecified part of right bronchus or lung: Secondary | ICD-10-CM

## 2016-11-06 NOTE — Progress Notes (Signed)
[facility] [phone]   [phone]  OFFICE PROGRESS NOTE  Axel Filler, MD [facility] [place] [address]  DIAGNOSIS: Stage IB (T2, N0, M0) non-small cell lung cancer diagnosed in November of 2007   PRIOR THERAPY: Status post right upper lobectomy with mediastinal lymph node dissection under the care of Dr. Roxan Hockey on 04/28/2006. The tumor size was 8 CM but the patient refused adjuvant chemotherapy at that time.   CURRENT THERAPY: Observation.  INTERVAL HISTORY: [name] 71 y.o. male returns to the clinic today for follow-up visit. The patient is feeling fine today with no specific complaints. He denied having any chest pain, shortness of breath, cough or hemoptysis. He denied having any fever or chills. He has no nausea, vomiting, diarrhea or constipation. He has no significant weight loss or night sweats. He has repeat CT scan of the chest performed recently and he is here for evaluation and discussion of his scan results.   MEDICAL HISTORY: Past Medical History:  Diagnosis Date  . AAA (abdominal aortic aneurysm) ([facility])    noted CT 11/2012 size 3.3 x 3.2 cm  . Alcohol abuse   . Barretts esophagus    bx distal esophagus 2011 neg   . Cataract   . Cataract   . CHF (congestive heart failure) ([place])   . Cholelithiasis   . Diverticulosis of colon   . DJD (degenerative joint disease)    left hip  . GERD (gastroesophageal reflux disease)   . Gout   . Hiatal hernia    2 cm noted on upper endos 2011   . Hx of adenomatous colonic polyps   . Hyperlipidemia   . Hypertension   . Internal hemorrhoid   . 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 Myocardial infarction ([place])    hx of, 2 stents 2004. pt was on Plavix x 3 years then transitioned to ASA  . Non-small cell carcinoma of right lung, stage 1 ([place]) 01/30/2016  . Renal failure, acute ([facility])    hx of 2/2  medication, resolved  . Tobacco abuse   . UTI (lower urinary tract infection)     ALLERGIES:  is allergic to candesartan cilexetil-hctz; hydrochlorothiazide; and shellfish allergy.  MEDICATIONS:  Current Outpatient Prescriptions  Medication Sig Dispense Refill  . allopurinol (ZYLOPRIM) 100 MG tablet TAKE 1 TABLET EVERY DAY 90 tablet 3  . amLODipine (NORVASC) 10 MG tablet Take 1 tablet (10 mg total) by mouth daily. 90 tablet 3  . aspirin EC 81 MG tablet Take 81 mg by mouth daily.    . clotrimazole (LOTRIMIN) 1 % cream     . HYDROcodone-acetaminophen (NORCO) 7.5-325 MG tablet Take 1 tablet by mouth every 6 (six) hours as needed for severe pain. 120 tablet 0  . metoprolol (LOPRESSOR) 50 MG tablet TAKE 1 TABLET TWICE DAILY 180 tablet 3  . Multiple Vitamin (MULTIVITAMIN) tablet Take 1 tablet by mouth daily.    . rosuvastatin (CRESTOR) 20 MG tablet TAKE 1 TABLET EVERY DAY 90 tablet 3  . TOLNAFTATE ANTIFUNGAL 1 % cream      No current facility-administered medications for this visit.     SURGICAL HISTORY:  Past Surgical History:  Procedure Laterality Date  . COLONOSCOPY    . CORONARY ANGIOPLASTY WITH STENT PLACEMENT    . flexible cystourethroscopy  05/24/06  . INGUINAL HERNIA REPAIR  2005  . LUNG  LOBECTOMY Right 11/07   RUL  . UPPER GASTROINTESTINAL ENDOSCOPY      REVIEW OF SYSTEMS:  A comprehensive review of systems was negative.   PHYSICAL EXAMINATION: General appearance: alert, cooperative and no distress Head: Normocephalic, without obvious abnormality, atraumatic Neck: no adenopathy Lymph nodes: Cervical, supraclavicular, and axillary nodes normal. Resp: clear to auscultation bilaterally Back: symmetric, no curvature. ROM normal. No CVA tenderness. Cardio: regular rate and rhythm, S1, S2 normal, no murmur, click, rub or gallop GI: soft, non-tender; bowel sounds normal; no masses,  no organomegaly Extremities: extremities normal, atraumatic, no cyanosis or edema  ECOG  PERFORMANCE STATUS: 1 - Symptomatic but completely ambulatory  Blood pressure 139/68, pulse 62, temperature 98.5 F (36.9 C), temperature source Oral, resp. rate 18, height 6' (1.829 m), weight 197 lb (89.4 kg), SpO2 100 %.  LABORATORY DATA: Lab Results  Component Value Date   WBC 5.0 10/30/2016   HGB 14.4 10/30/2016   HCT 44.1 10/30/2016   MCV 100.8 (H) 10/30/2016   PLT 137 (L) 10/30/2016      Chemistry      Component Value Date/Time   NA 143 10/30/2016 0933   K 3.8 10/30/2016 0933   CL 106 03/02/2015 1137   CL 101 01/25/2011 0901   CO2 23 10/30/2016 0933   BUN 13.7 10/30/2016 0933   CREATININE 1.1 10/30/2016 0933      Component Value Date/Time   CALCIUM 9.3 10/30/2016 0933   ALKPHOS 91 10/30/2016 0933   AST 58 (H) 10/30/2016 0933   ALT 39 10/30/2016 0933   BILITOT 0.52 10/30/2016 0933       RADIOGRAPHIC STUDIES: Ct Chest W Contrast  Result Date: 11/04/2016 CLINICAL DATA:  Restaging lung cancer.  Diagnosis 2007. EXAM: CT CHEST WITH CONTRAST TECHNIQUE: Multidetector CT imaging of the chest was performed during intravenous contrast administration. CONTRAST:  152mL ISOVUE-300 IOPAMIDOL (ISOVUE-300) INJECTION 61% COMPARISON:  CT 07/29/2016, CT 01/26/2016 FINDINGS: Cardiovascular: Coronary artery calcification and aortic atherosclerotic calcification. Mediastinum/Nodes: No axillary or supraclavicular adenopathy. Small mediastinal lymph nodes are less than 10 mm and unchanged. More prominent RIGHT hilar lymph nodes are not changed and less than 10 mm (image 73 and 65 series 2). No central pulmonary embolism. Lungs/Pleura: 5 mm LEFT upper lobe pulmonary nodule (image 34, series 5) is not seen on comparison exam. Nodule anterior to oblique fissure LEFT upper lobe measuring 3 mm (image 77, series 5) is not seen on prior. In LEFT lower lobe subpleural nodule medially measuring 2 mm (image 73, series 5) is decreased from 5 mm. Nodule in the azygos esophageal recess measuring 3 mm (image  92, series 5) compares to 2 mm. A previous 6 mm nodule in the lateral RIGHT lower lobe since resolved. Upper Abdomen: Stable high-density lesions in the posterior aspect of the RIGHT kidney is likely a proteinaceous cyst (immune density on comparison noncontrast CT close per. Low-attenuation lesions in the RIGHT kidney. Adrenal glands normal. Musculoskeletal: No aggressive osseous lesion. IMPRESSION: 1. Waxing and waning sub 5 mm pulmonary nodules in the RIGHT and LEFT lung. Recommend routine lung cancer surveillance. 2. Stable prominent lymph nodes in the RIGHT hilum. 3. Probable benign proteinaceous cyst of the LEFT kidney. Electronically Signed   By: Suzy Bouchard M.D.   On: 11/04/2016 14:46   ASSESSMENT AND PLAN:  This is a very pleasant 71 years old African-American male with history of stage IB non-small cell lung cancer status post right upper lobectomy with mediastinal lymph node dissection and has been observation  since 2007. He had repeat CT scan of the chest that showed several pulmonary nodules that has been waxing and waning over the last few years. I discussed the scan results with the patient today. I recommended for him to continue on observation with repeat CT scan of the chest in 6 months for reevaluation of his disease. He was advised to call immediately if he has any concerning symptoms in the interval. The patient voices understanding of current disease status and treatment options and is in agreement with the current care plan. All questions were answered. The patient knows to call the clinic with any problems, questions or concerns. We can certainly see the patient much sooner if necessary. I spent 10 minutes counseling the patient face to face. The total time spent in the appointment was 15 minutes. Disclaimer: This note was dictated with voice recognition software. Similar sounding words can inadvertently be transcribed and may be missed upon review.

## 2016-11-06 NOTE — Telephone Encounter (Signed)
Gave patient AVS and calender per 5/23 - [facility] to contact patient with Ct schedule.

## 2016-11-06 NOTE — Patient Instructions (Signed)
Steps to Quit Smoking Smoking tobacco can be bad for your health. It can also affect almost every organ in your body. Smoking puts you and people around you at risk for many serious long-lasting (chronic) diseases. Quitting smoking is hard, but it is one of the best things that you can do for your health. It is never too late to quit. What are the benefits of quitting smoking? When you quit smoking, you lower your risk for getting serious diseases and conditions. They can include:  Lung cancer or lung disease.  Heart disease.  Stroke.  Heart attack.  Not being able to have children (infertility).  Weak bones (osteoporosis) and broken bones (fractures). If you have coughing, wheezing, and shortness of breath, those symptoms may get better when you quit. You may also get sick less often. If you are pregnant, quitting smoking can help to lower your chances of having a baby of low birth weight. What can I do to help me quit smoking? Talk with your doctor about what can help you quit smoking. Some things you can do (strategies) include:  Quitting smoking totally, instead of slowly cutting back how much you smoke over a period of time.  Going to in-person counseling. You are more likely to quit if you go to many counseling sessions.  Using resources and support systems, such as:  Online chats with a counselor.  Phone quitlines.  Printed self-help materials.  Support groups or group counseling.  Text messaging programs.  Mobile phone apps or applications.  Taking medicines. Some of these medicines may have nicotine in them. If you are pregnant or breastfeeding, do not take any medicines to quit smoking unless your doctor says it is okay. Talk with your doctor about counseling or other things that can help you. Talk with your doctor about using more than one strategy at the same time, such as taking medicines while you are also going to in-person counseling. This can help make quitting  easier. What things can I do to make it easier to quit? Quitting smoking might feel very hard at first, but there is a lot that you can do to make it easier. Take these steps:  Talk to your family and friends. Ask them to support and encourage you.  Call phone quitlines, reach out to support groups, or work with a counselor.  Ask people who smoke to not smoke around you.  Avoid places that make you want (trigger) to smoke, such as:  Bars.  Parties.  Smoke-break areas at work.  Spend time with people who do not smoke.  Lower the stress in your life. Stress can make you want to smoke. Try these things to help your stress:  Getting regular exercise.  Deep-breathing exercises.  Yoga.  Meditating.  Doing a body scan. To do this, close your eyes, focus on one area of your body at a time from head to toe, and notice which parts of your body are tense. Try to relax the muscles in those areas.  Download or buy apps on your mobile phone or tablet that can help you stick to your quit plan. There are many free apps, such as QuitGuide from the CDC (Centers for Disease Control and Prevention). You can find more support from smokefree.gov and other websites. This information is not intended to replace advice given to you by your health care provider. Make sure you discuss any questions you have with your health care provider. Document Released: 03/30/2009 Document Revised: 01/30/2016 Document   Reviewed: 10/18/2014 Elsevier Interactive Patient Education  2017 Elsevier Inc.  

## 2016-12-09 ENCOUNTER — Encounter: Payer: Self-pay | Admitting: Student in an Organized Health Care Education/Training Program

## 2016-12-09 ENCOUNTER — Ambulatory Visit (INDEPENDENT_AMBULATORY_CARE_PROVIDER_SITE_OTHER): Payer: Medicare HMO | Admitting: Student in an Organized Health Care Education/Training Program

## 2016-12-09 VITALS — BP 120/62 | HR 63 | Temp 98.2°F | Ht 72.0 in | Wt 196.4 lb

## 2016-12-09 DIAGNOSIS — G8929 Other chronic pain: Secondary | ICD-10-CM

## 2016-12-09 DIAGNOSIS — F1010 Alcohol abuse, uncomplicated: Secondary | ICD-10-CM | POA: Insufficient documentation

## 2016-12-09 DIAGNOSIS — I10 Essential (primary) hypertension: Secondary | ICD-10-CM

## 2016-12-09 DIAGNOSIS — F17210 Nicotine dependence, cigarettes, uncomplicated: Secondary | ICD-10-CM | POA: Diagnosis not present

## 2016-12-09 DIAGNOSIS — Z79891 Long term (current) use of opiate analgesic: Secondary | ICD-10-CM | POA: Diagnosis not present

## 2016-12-09 DIAGNOSIS — M545 Low back pain: Secondary | ICD-10-CM | POA: Diagnosis not present

## 2016-12-09 DIAGNOSIS — M1612 Unilateral primary osteoarthritis, left hip: Secondary | ICD-10-CM

## 2016-12-09 DIAGNOSIS — Z79899 Other long term (current) drug therapy: Secondary | ICD-10-CM | POA: Diagnosis not present

## 2016-12-09 DIAGNOSIS — M25552 Pain in left hip: Secondary | ICD-10-CM

## 2016-12-09 DIAGNOSIS — M25551 Pain in right hip: Secondary | ICD-10-CM

## 2016-12-09 MED ORDER — HYDROCODONE-ACETAMINOPHEN 7.5-325 MG PO TABS: 1.0000 | ORAL_TABLET | Freq: Four times a day (QID) | ORAL | 0 refills | Status: DC | PRN

## 2016-12-09 NOTE — Assessment & Plan Note (Signed)
Blood pressure is at goal today. Plan to continue amlodipine and metoprolol.

## 2016-12-09 NOTE — Assessment & Plan Note (Signed)
Chronic pain generator is left hip osteoarthritis and lower back pain without radiculopathy. He started on opioid therapy about 5 years ago, currently uses 4 tablets of Norco daily and reports good benefit. Says the medication helps him complete daily tasks. He likes to walk around his neighborhood and visit with family and friends. He was told by his prior physician that he was not a good candidate for hip arthroplasty and has instead focused on chronic medication management. Urine drug screens have been normal in the past, urine was collected today. He reported last hydrocodone use was this morning. I reviewed the [place] today which shows appropriate dispensing. I refilled Norco 7.5mg  #120, R2 for three month supply. Follow-up with me in 3 months for all refills in visits.

## 2016-12-09 NOTE — Assessment & Plan Note (Signed)
The patient has been using alcohol for most of his adult life. Has a history of several DUIs over 20 years ago. Currently reports drinking 4-5 drinks nightly, several times a week. There are some nights that he does not drink alcohol. No history of withdrawal. Denies current dependence. I advised him that's I would consider this to be unhealthy amount of alcohol consumption and risky given opioid prescription use. The patient agrees to try to cut back on alcohol use over the coming months.

## 2016-12-09 NOTE — Progress Notes (Signed)
Assessment and Plan:  See Encounters tab for problem-based medical decision making.   __________________________________________________________  HPI:  71 year old man here for follow-up of chronic left hip pain. Patient reports that he's been doing well at home. He's been ambulating around the house without difficulty. He uses Norco about 4 times daily for this chronic pain for last 4 years. Reports good compliance, no adverse side effects. Denies constipation. Says that the medicine helps him complete his activities of daily living. It helps him to walk around his neighborhood and visit with friends. Denies any recent falls. Reports good compliance with all his other medications without adverse side effects. No chest pain or dyspnea on exertion. No recent hospitalizations or surgeries. No visits to the emergency department or urgent care.  __________________________________________________________  Problem List: Patient Active Problem List   Diagnosis Date Noted  . Mild alcohol use disorder 12/09/2016    Priority: High  . Non-small cell carcinoma of right lung, stage 1 ([place]) 01/30/2016    Priority: High  . Chronic use of opiate for therapeutic purpose 05/01/2015    Priority: High  . Coronary artery disease of native artery of native heart with stable angina pectoris ([place]) 03/17/2012    Priority: High  . Tobacco use disorder 04/18/2006    Priority: High  . Atherosclerosis of aorta ([place]) 01/13/2015    Priority: Medium  . Prediabetes 12/30/2014    Priority: Medium  . AAA (abdominal aortic aneurysm) ([place])     Priority: Medium  . Essential hypertension 04/18/2006    Priority: Medium  . GERD 04/18/2006    Priority: Medium  . Premature atrial contractions 07/31/2015    Priority: Low  . Healthcare maintenance 06/30/2012    Priority: Low  . Erectile dysfunction 10/12/2010    Priority: Low  . Arthropathy of pelvic region and thigh 05/02/2008    Priority: Low  . History of colonic  polyps 04/18/2006    Priority: Low  . Gout 04/18/2006    Priority: Low    Medications: Reconciled today in Epic __________________________________________________________  Physical Exam:  Vital Signs: Vitals:   12/09/16 0912  BP: 120/62  Pulse: 63  Temp: 98.2 F (36.8 C)  TempSrc: Oral  SpO2: 96%  Weight: 196 lb 6.4 oz (89.1 kg)  Height: 6' (1.829 m)    Gen: Well appearing, NAD Neck: No cervical LAD, No thyromegaly or nodules, No JVD. CV: RRR, no murmurs Pulm: Normal effort, CTA throughout, no wheezing Ext: Warm, no edema, normal joints Skin: No atypical appearing moles. No rashes

## 2016-12-17 LAB — TOXASSURE SELECT,+ANTIDEPR,UR

## 2017-01-20 ENCOUNTER — Other Ambulatory Visit: Payer: Self-pay | Admitting: Internal Medicine

## 2017-01-22 IMAGING — US US AORTA
1 series · 14 of 14 positions shown · non-contrast
Comparison: None.

CLINICAL DATA: Aorto follow-up

EXAM:
ULTRASOUND OF ABDOMINAL AORTA
TECHNIQUE: Ultrasound examination of the abdominal aorta was performed to
evaluate for abdominal aortic aneurysm.

[Series 1: us aorta · 0.27mm/px · 14 of 14 slices shown]
[im 1/14]
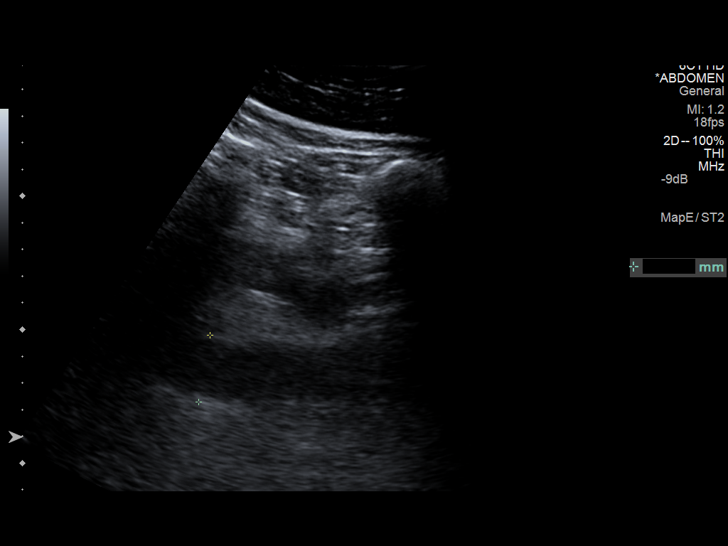
[im 2/14]
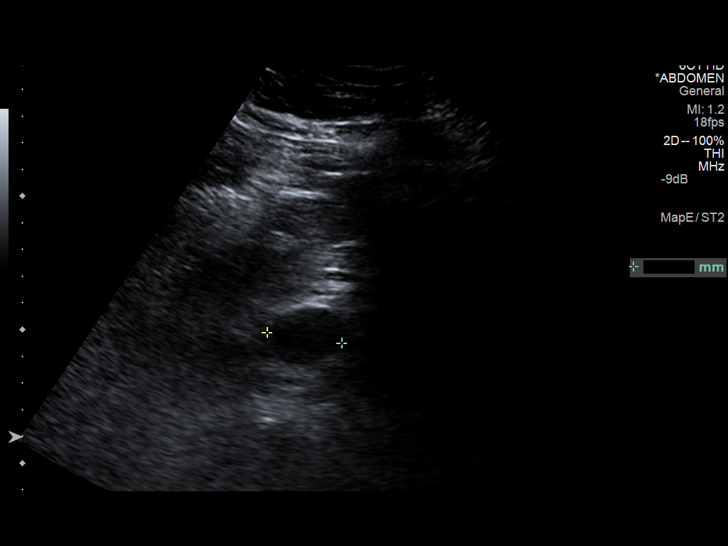
[im 3/14]
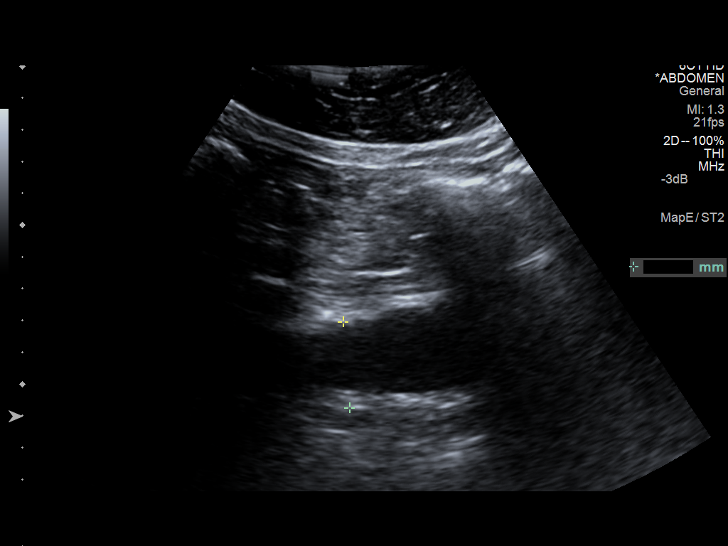
[im 4/14]
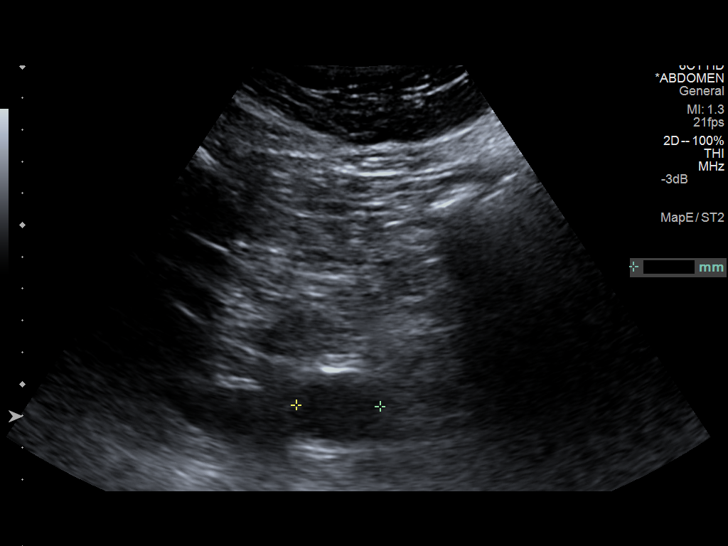
[im 5/14]
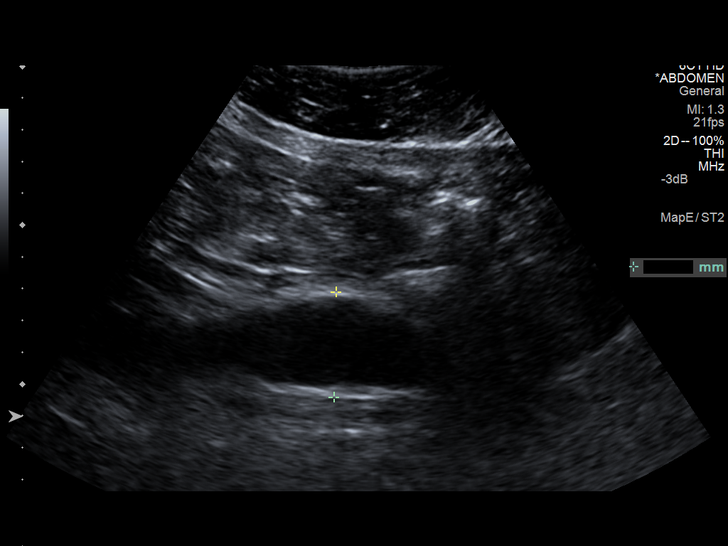
[im 6/14]
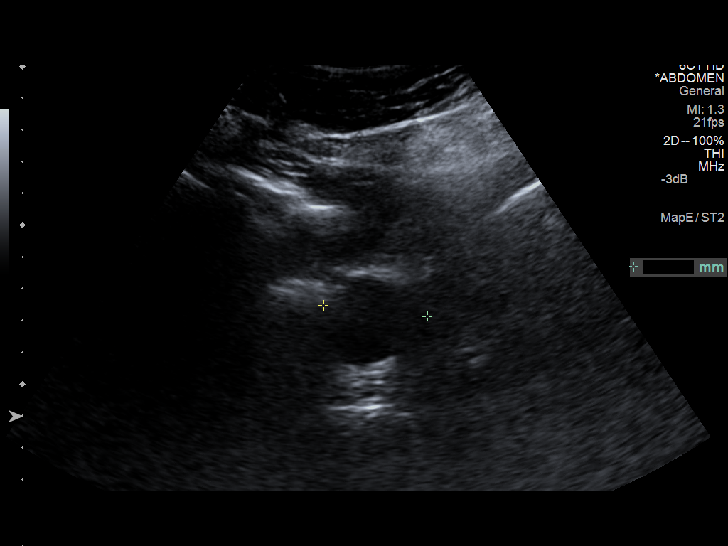
[im 7/14]
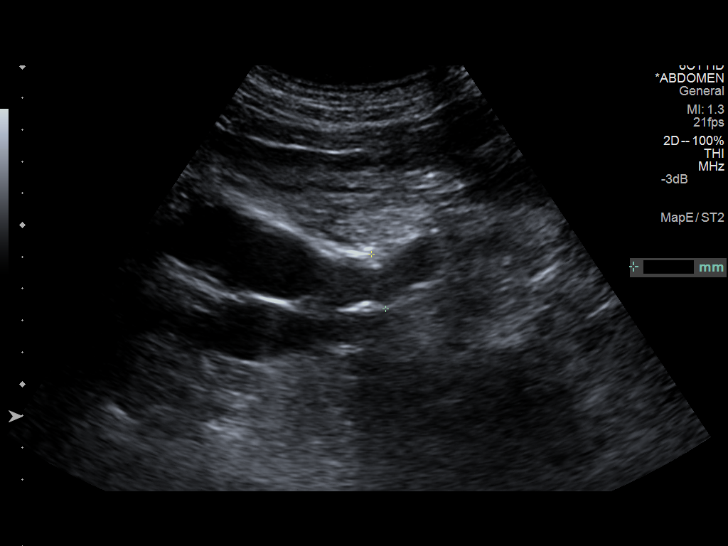
[im 8/14]
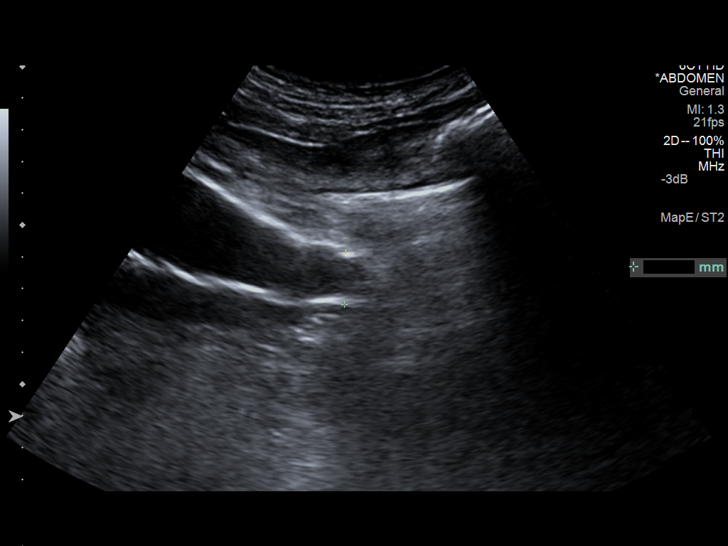
[im 9/14]
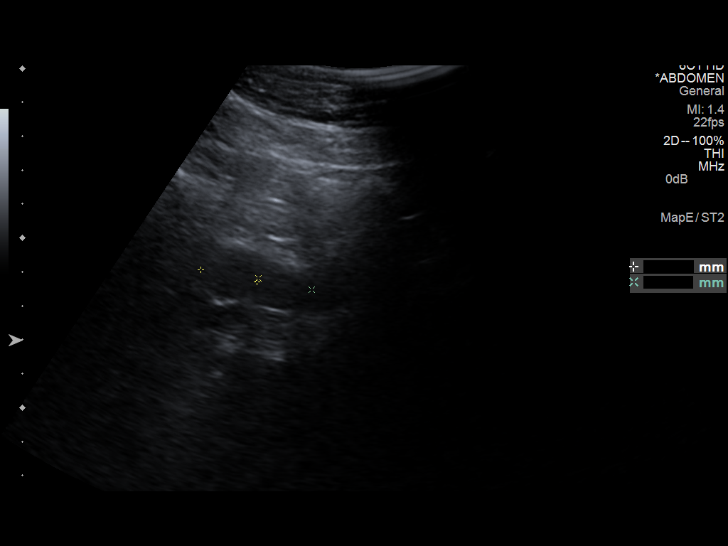
[im 10/14]
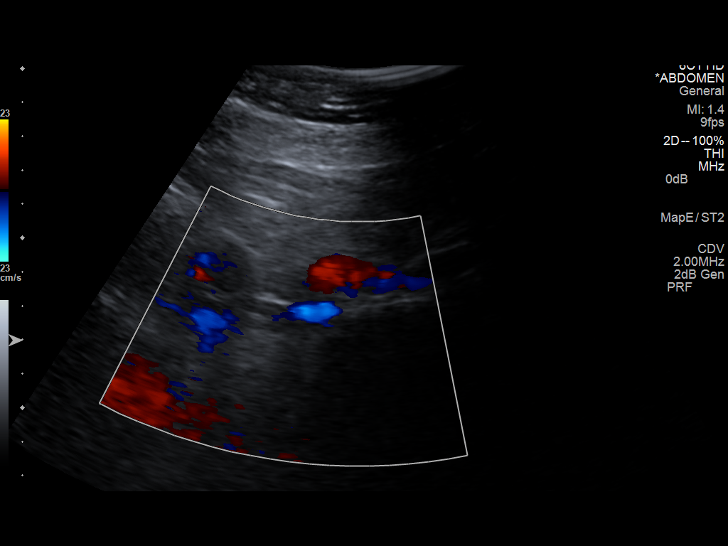
[im 11/14]
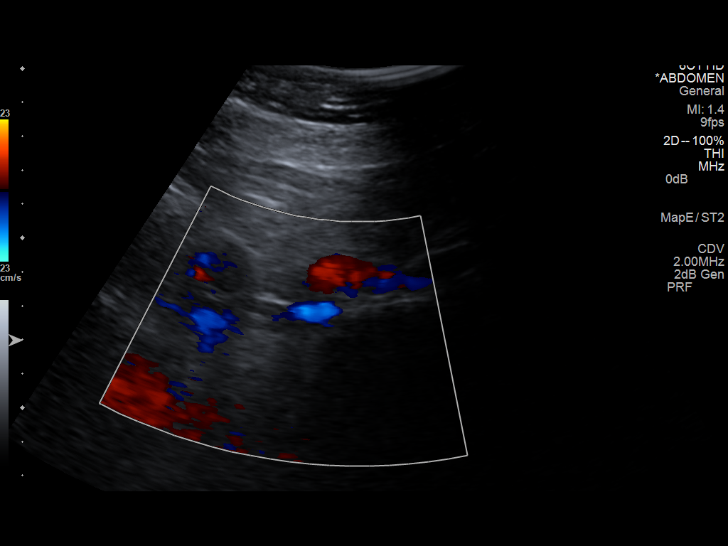
[im 12/14]
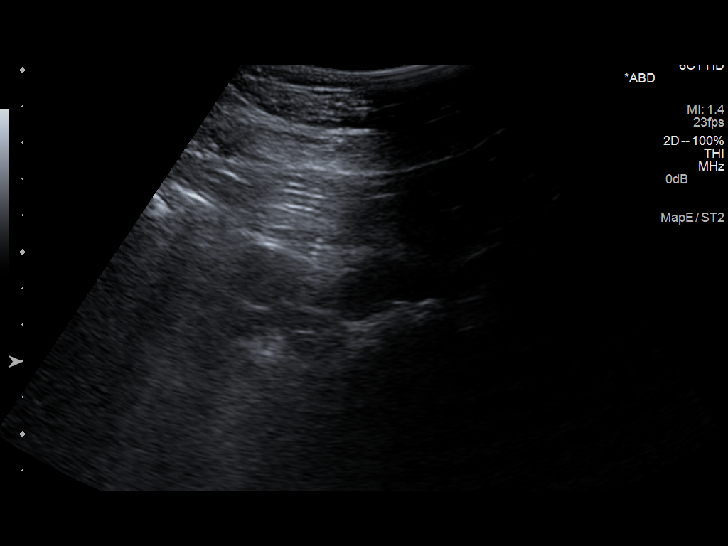
[im 13/14]
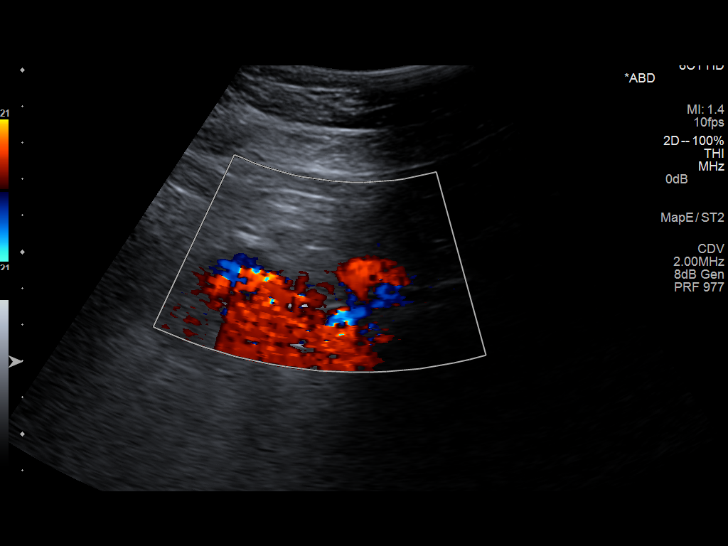
[im 14/14]
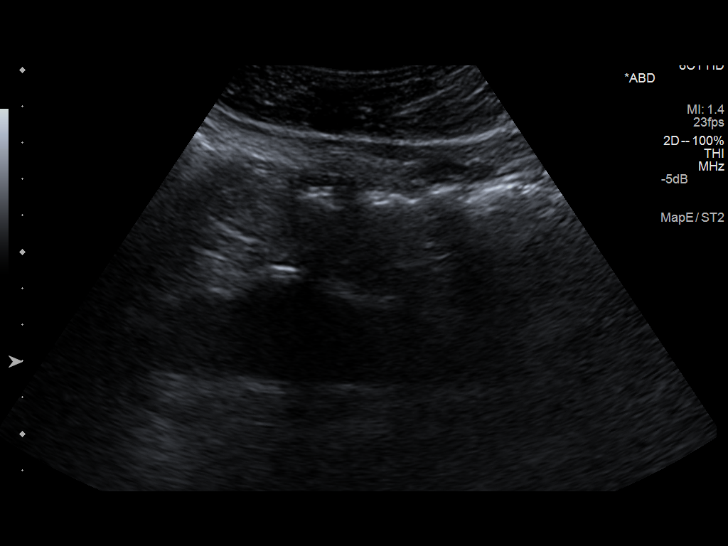

[14 of 14 positions shown; findings below may reference images not displayed]

FINDINGS: Abdominal Aorta

Mild aneurysmal dilatation of the distal aorta is stable.

Maximum AP

Diameter:  3.3 cm.

Maximum TRV

Diameter: 3.2 cm.

Maximal right and left common iliac artery diameters are 17 and 16
mm, respectively. Previous measurements were 16 and 16 mm.
IMPRESSION: Mild aneurysmal dilatation of the distal aorta is stable at 3.3 cm.
Recommend follow up by US in 6years. This recommendation follows ACR
consensus guidelines: White Paper of the ACR Incidental Findings

## 2017-02-03 ENCOUNTER — Other Ambulatory Visit: Payer: Medicare HMO

## 2017-02-05 ENCOUNTER — Ambulatory Visit: Payer: Medicare HMO | Admitting: Internal Medicine

## 2017-03-03 ENCOUNTER — Ambulatory Visit (INDEPENDENT_AMBULATORY_CARE_PROVIDER_SITE_OTHER): Payer: Medicare HMO | Admitting: Student in an Organized Health Care Education/Training Program

## 2017-03-03 ENCOUNTER — Encounter: Payer: Self-pay | Admitting: Student in an Organized Health Care Education/Training Program

## 2017-03-03 VITALS — BP 130/80 | HR 68 | Temp 97.4°F | Ht 72.0 in | Wt 197.5 lb

## 2017-03-03 DIAGNOSIS — I714 Abdominal aortic aneurysm, without rupture, unspecified: Secondary | ICD-10-CM

## 2017-03-03 DIAGNOSIS — F17200 Nicotine dependence, unspecified, uncomplicated: Secondary | ICD-10-CM

## 2017-03-03 DIAGNOSIS — M25551 Pain in right hip: Secondary | ICD-10-CM

## 2017-03-03 DIAGNOSIS — Z860100 Personal history of colon polyps, unspecified: Secondary | ICD-10-CM

## 2017-03-03 DIAGNOSIS — Z79891 Long term (current) use of opiate analgesic: Secondary | ICD-10-CM

## 2017-03-03 DIAGNOSIS — M25552 Pain in left hip: Secondary | ICD-10-CM

## 2017-03-03 DIAGNOSIS — Z23 Encounter for immunization: Secondary | ICD-10-CM

## 2017-03-03 DIAGNOSIS — F1010 Alcohol abuse, uncomplicated: Secondary | ICD-10-CM

## 2017-03-03 MED ORDER — HYDROCODONE-ACETAMINOPHEN 7.5-325 MG PO TABS: 1.0000 | ORAL_TABLET | Freq: Four times a day (QID) | ORAL | 0 refills | Status: DC | PRN

## 2017-03-03 NOTE — Patient Instructions (Signed)
1. I have referred you for a colonoscopy.   2. Take your medicines as prescribed.

## 2017-03-03 NOTE — Assessment & Plan Note (Signed)
I spent 5 minutes counseling on tobacco cessation. Patient is actively trying to cut down on tobacco use, currently smoking about a third pack per day. Using nicotine replacement in the form of gum. Has tried Chantix with no benefits. I offered Zyban, but patient is not interested.

## 2017-03-03 NOTE — Assessment & Plan Note (Signed)
Chronic pain generator is hip osteoarthritis and low back pain. He is historically done very well on this current regimen of Norco, required no adjustments in dose over the last 2 years at least. He follows up at each appointments, is having no side effects from medications, last U tox was fine, and has had no red flag behavior. My only concern is his moderate alcohol use disorder, but so far there does not seem to be any adverse interaction. I gave him a 3 month supply of Norco today to continue her current dosing, and follow-up with me in December for refill.

## 2017-03-03 NOTE — Assessment & Plan Note (Signed)
Patient continues to drink more than recommended a majority nights of the week. He's pre-contemplative, likes the social interaction appearing around his friends, drinks more than usual during football season. Not really interested in cutting back right now. No signs of cirrhosis on exam, liver function testing was reasonable though he did have an elevated AST at our last visit.

## 2017-03-03 NOTE — Assessment & Plan Note (Signed)
Patient has a known small infrarenal abdominal aortic aneurysm measuring 3.3 cm last imaged in March 2016. 2. care ultrasound today, little bit of a difficult study but aneurysm appeared to be still 3.2 cm. He will get a formal ultrasound in 2019 for continued surveillance. No symptoms. We'll continue with aspirin and rosuvastatin and trying to work on tobacco cessation.

## 2017-03-03 NOTE — Assessment & Plan Note (Signed)
Last colonoscopy in 2013 had a 2 mm cecal polyp that was an adenoma. He is doing very well with a good life expectancy, recommended for five-year follow-up, I have ordered a repeat colonoscopy.
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
## 2017-03-10 ENCOUNTER — Encounter: Payer: Self-pay | Admitting: *Deleted

## 2017-03-13 ENCOUNTER — Encounter: Payer: Self-pay | Admitting: Internal Medicine

## 2017-04-23 ENCOUNTER — Other Ambulatory Visit: Payer: Self-pay | Admitting: Student in an Organized Health Care Education/Training Program

## 2017-04-30 ENCOUNTER — Ambulatory Visit (AMBULATORY_SURGERY_CENTER): Payer: Self-pay | Admitting: *Deleted

## 2017-04-30 ENCOUNTER — Other Ambulatory Visit: Payer: Self-pay

## 2017-04-30 VITALS — Ht 72.0 in | Wt 196.4 lb

## 2017-04-30 DIAGNOSIS — Z86010 Personal history of colonic polyps: Secondary | ICD-10-CM

## 2017-04-30 MED ORDER — SUPREP BOWEL PREP KIT 17.5-3.13-1.6 GM/177ML PO SOLN: 1.0000 | Freq: Once | ORAL | 0 refills | Status: AC

## 2017-04-30 NOTE — Progress Notes (Signed)
Patient is requesting Suprep for his prep.  Patient states his insurance will pay for the prep.

## 2017-04-30 NOTE — Progress Notes (Signed)
Patient denies any allergies to egg or soy products. Patient denies complications with anesthesia/sedation.  Patient denies oxygen use at home and denies diet medications. Patient denies information on colonoscopy. 

## 2017-05-06 ENCOUNTER — Other Ambulatory Visit (HOSPITAL_BASED_OUTPATIENT_CLINIC_OR_DEPARTMENT_OTHER): Payer: Medicare HMO

## 2017-05-06 ENCOUNTER — Encounter (HOSPITAL_COMMUNITY): Payer: Self-pay

## 2017-05-06 ENCOUNTER — Ambulatory Visit (HOSPITAL_COMMUNITY)
Admission: RE | Admit: 2017-05-06 | Discharge: 2017-05-06 | Disposition: A | Discharge status: Home / Self Care | Payer: Medicare HMO | Source: Ambulatory Visit | Attending: Internal Medicine | Admitting: Internal Medicine

## 2017-05-06 DIAGNOSIS — C3490 Malignant neoplasm of unspecified part of unspecified bronchus or lung: Secondary | ICD-10-CM | POA: Diagnosis not present

## 2017-05-06 DIAGNOSIS — I700 Atherosclerosis of aorta: Secondary | ICD-10-CM | POA: Diagnosis not present

## 2017-05-06 DIAGNOSIS — I2510 Atherosclerotic heart disease of native coronary artery without angina pectoris: Secondary | ICD-10-CM | POA: Diagnosis not present

## 2017-05-06 DIAGNOSIS — C3491 Malignant neoplasm of unspecified part of right bronchus or lung: Secondary | ICD-10-CM | POA: Diagnosis not present

## 2017-05-06 DIAGNOSIS — N281 Cyst of kidney, acquired: Secondary | ICD-10-CM | POA: Diagnosis not present

## 2017-05-06 DIAGNOSIS — Z85118 Personal history of other malignant neoplasm of bronchus and lung: Secondary | ICD-10-CM

## 2017-05-06 DIAGNOSIS — R911 Solitary pulmonary nodule: Secondary | ICD-10-CM | POA: Diagnosis not present

## 2017-05-06 LAB — COMPREHENSIVE METABOLIC PANEL
ALT: 43 U/L (ref 0–55)
ANION GAP: 8 meq/L (ref 3–11)
AST: 63 U/L — AB (ref 5–34)
Albumin: 3.6 g/dL (ref 3.5–5.0)
Alkaline Phosphatase: 73 U/L (ref 40–150)
BUN: 14.6 mg/dL (ref 7.0–26.0)
CALCIUM: 9.7 mg/dL (ref 8.4–10.4)
CHLORIDE: 107 meq/L (ref 98–109)
CO2: 27 mEq/L (ref 22–29)
Creatinine: 1.1 mg/dL (ref 0.7–1.3)
EGFR: >60 mL/min/{1.73_m2} (ref >60)
Glucose: 108 mg/dl (ref 70–140)
POTASSIUM: 4 meq/L (ref 3.5–5.1)
Sodium: 142 mEq/L (ref 136–145)
Total Bilirubin: 0.71 mg/dL (ref 0.20–1.20)
Total Protein: 7.6 g/dL (ref 6.4–8.3)

## 2017-05-06 LAB — CBC WITH DIFFERENTIAL/PLATELET
BASO%: 0.6 % (ref 0.0–2.0)
Basophils Absolute: 0 10*3/uL (ref 0.0–0.1)
EOS%: 1.4 % (ref 0.0–7.0)
Eosinophils Absolute: 0.1 10*3/uL (ref 0.0–0.5)
HEMATOCRIT: 41.9 % (ref 38.4–49.9)
HGB: 13.7 g/dL (ref 13.0–17.1)
LYMPH#: 1 10*3/uL (ref 0.9–3.3)
LYMPH%: 20.4 % (ref 14.0–49.0)
MCH: 33 pg (ref 27.2–33.4)
MCHC: 32.8 g/dL (ref 32.0–36.0)
MCV: 100.7 fL — ABNORMAL HIGH (ref 79.3–98.0)
MONO#: 0.4 10*3/uL (ref 0.1–0.9)
MONO%: 8.1 % (ref 0.0–14.0)
NEUT#: 3.3 10*3/uL (ref 1.5–6.5)
NEUT%: 69.5 % (ref 39.0–75.0)
Platelets: 131 10*3/uL — ABNORMAL LOW (ref 140–400)
RBC: 4.16 10*6/uL — ABNORMAL LOW (ref 4.20–5.82)
RDW: 14.5 % (ref 11.0–14.6)
WBC: 4.7 10*3/uL (ref 4.0–10.3)

## 2017-05-06 MED ORDER — IOPAMIDOL (ISOVUE-300) INJECTION 61%
75.0000 mL | Freq: Once | INTRAVENOUS | Status: AC | PRN
  Administered 2017-05-06: 75 mL via INTRAVENOUS

## 2017-05-06 MED ORDER — IOPAMIDOL (ISOVUE-300) INJECTION 61%
INTRAVENOUS | Status: AC
  Filled 2017-05-06: qty 75

## 2017-05-12 ENCOUNTER — Encounter: Payer: Self-pay | Admitting: Internal Medicine

## 2017-05-12 ENCOUNTER — Ambulatory Visit (HOSPITAL_BASED_OUTPATIENT_CLINIC_OR_DEPARTMENT_OTHER): Payer: Medicare HMO | Admitting: Internal Medicine

## 2017-05-12 VITALS — BP 133/69 | HR 69 | Temp 98.1°F | Resp 20 | Ht 72.0 in | Wt 194.0 lb

## 2017-05-12 DIAGNOSIS — Z85118 Personal history of other malignant neoplasm of bronchus and lung: Secondary | ICD-10-CM | POA: Diagnosis not present

## 2017-05-12 DIAGNOSIS — R911 Solitary pulmonary nodule: Secondary | ICD-10-CM | POA: Diagnosis not present

## 2017-05-12 DIAGNOSIS — C3491 Malignant neoplasm of unspecified part of right bronchus or lung: Secondary | ICD-10-CM

## 2017-05-12 DIAGNOSIS — Z720 Tobacco use: Secondary | ICD-10-CM

## 2017-05-12 NOTE — Progress Notes (Signed)
[facility] [phone]   Fax:([phone]  OFFICE PROGRESS NOTE  Axel Filler, MD [place] [address]  DIAGNOSIS: Stage IB (T2, N0, M0) non-small cell lung cancer diagnosed in November of 2007   PRIOR THERAPY: Status post right upper lobectomy with mediastinal lymph node dissection under the care of Dr. Roxan Hockey on 04/28/2006. The tumor size was 8 CM but the patient refused adjuvant chemotherapy at that time.   CURRENT THERAPY: Observation.  INTERVAL HISTORY: [name] 71 y.o. male returns to the clinic today for annual follow-up visit.  The patient is feeling fine today with no specific complaints.  He continues to smoke a few cigarettes every day.  He denied having any chest pain, shortness of breath, cough or hemoptysis.  He denied having any weight loss or night sweats.  He has no nausea, vomiting, diarrhea or constipation.  The patient had repeat CT scan of the chest performed recently and he is here for evaluation and discussion of his discuss results.  He is also scheduled to have colonoscopy tomorrow.  MEDICAL HISTORY: Past Medical History:  Diagnosis Date  . AAA (abdominal aortic aneurysm) ([facility])    noted CT 11/2012 size 3.3 x 3.2 cm  . Alcohol abuse   . Barretts esophagus    bx distal esophagus 2011 neg   . Cataract   . Cataract   . CHF (congestive heart failure) ([place])   . Cholelithiasis   . Diverticulosis of colon   . DJD (degenerative joint disease)    left hip  . Full dentures    upper and lower  . GERD (gastroesophageal reflux disease)    diet controlled   . Gout    feet  . Hiatal hernia    2 cm noted on upper endos 2011   . Hx of adenomatous colonic polyps   . Hyperlipidemia   . Hypertension   . Internal hemorrhoid   . 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 Myocardial infarction [facility]) 2004   hx of, 2 stents 2004. pt was on Plavix x  3 years then transitioned to ASA, no problems since  . Non-small cell carcinoma of right lung, stage 1 ([place]) 01/30/2016  . Renal failure, acute (H[facility])    hx of 2/2 medication, resolved  . Tobacco abuse    smoker .5 ppd x 55 yrs  . UTI (lower urinary tract infection)     ALLERGIES:  is allergic to candesartan cilexetil-hctz; hydrochlorothiazide; and shellfish allergy.  MEDICATIONS:  Current Outpatient Medications  Medication Sig Dispense Refill  . allopurinol (ZYLOPRIM) 100 MG tablet TAKE 1 TABLET EVERY DAY 90 tablet 3  . amLODipine (NORVASC) 10 MG tablet TAKE 1 TABLET (10 MG TOTAL) BY MOUTH DAILY. 90 tablet 3  . aspirin EC 81 MG tablet Take 81 mg by mouth daily.    . clotrimazole (LOTRIMIN) 1 % cream     . HYDROcodone-acetaminophen (NORCO) 7.5-325 MG tablet Take 1 tablet by mouth every 6 (six) hours as needed for severe pain. 120 tablet 0  . metoprolol (LOPRESSOR) 50 MG tablet TAKE 1 TABLET TWICE DAILY 180 tablet 3  . Multiple Vitamin (MULTIVITAMIN) tablet Take 1 tablet by mouth daily.    . rosuvastatin (CRESTOR) 20 MG tablet TAKE 1 TABLET EVERY DAY 90 tablet 3  . TOLNAFTATE ANTIFUNGAL 1 % cream      No current facility-administered medications  for this visit.     SURGICAL HISTORY:  Past Surgical History:  Procedure Laterality Date  . cataract surgery Bilateral 2018   removal of cataracts  . COLONOSCOPY     hx polyps - [name] 02/2012  . CORONARY ANGIOPLASTY WITH STENT PLACEMENT    . flexible cystourethroscopy  05/24/06  . INGUINAL HERNIA REPAIR  2005  . LUNG LOBECTOMY Right 11/07   RUL  . MULTIPLE TOOTH EXTRACTIONS    . UPPER GASTROINTESTINAL ENDOSCOPY      REVIEW OF SYSTEMS:  A comprehensive review of systems was negative.   PHYSICAL EXAMINATION: General appearance: alert, cooperative and no distress Head: Normocephalic, without obvious abnormality, atraumatic Neck: no adenopathy Lymph nodes: Cervical, supraclavicular, and axillary nodes normal. Resp: clear to  auscultation bilaterally Back: symmetric, no curvature. ROM normal. No CVA tenderness. Cardio: regular rate and rhythm, S1, S2 normal, no murmur, click, rub or gallop GI: soft, non-tender; bowel sounds normal; no masses,  no organomegaly Extremities: extremities normal, atraumatic, no cyanosis or edema  ECOG PERFORMANCE STATUS: 1 - Symptomatic but completely ambulatory  Blood pressure 133/69, pulse 69, temperature 98.1 F (36.7 C), temperature source Oral, resp. rate 20, height 6' (1.829 m), weight 194 lb (88 kg), SpO2 92 %.  LABORATORY DATA: Lab Results  Component Value Date   WBC 4.7 05/06/2017   HGB 13.7 05/06/2017   HCT 41.9 05/06/2017   MCV 100.7 (H) 05/06/2017   PLT 131 (L) 05/06/2017      Chemistry      Component Value Date/Time   NA 142 05/06/2017 0926   K 4.0 05/06/2017 0926   CL 106 03/02/2015 1137   CL 101 01/25/2011 0901   CO2 27 05/06/2017 0926   BUN 14.6 05/06/2017 0926   CREATININE 1.1 05/06/2017 0926      Component Value Date/Time   CALCIUM 9.7 05/06/2017 0926   ALKPHOS 73 05/06/2017 0926   AST 63 (H) 05/06/2017 0926   ALT 43 05/06/2017 0926   BILITOT 0.71 05/06/2017 0926       RADIOGRAPHIC STUDIES: Ct Chest W Contrast  Result Date: 05/06/2017 CLINICAL DATA:  Restaging lung cancer. EXAM: CT CHEST WITH CONTRAST TECHNIQUE: Multidetector CT imaging of the chest was performed during intravenous contrast administration. CONTRAST:  65mL ISOVUE-300 IOPAMIDOL (ISOVUE-300) INJECTION 61% COMPARISON:  11/04/2016 FINDINGS: Cardiovascular: The heart is normal in size and stable. No pericardial effusion. Stable moderate tortuosity, ectasia and calcification of the thoracic aorta and branch vessels. Stable dense three-vessel coronary artery calcifications. Mediastinum/Nodes: Small scattered mediastinal and hilar lymph nodes are stable. No new or progressive adenopathy. Right hilar lymph node on image number 61 measures 19.5 mm and is stable. Slightly more inferiorly on  image number 64 is a stable 9.5 mm lymph node. 11 mm left hilar lymph node on image number 79 is stable. The esophagus is grossly normal. Lungs/Pleura: Stable emphysematous changes and pulmonary scarring. Surgical changes involving the right lung with loss of volume. No findings for recurrent tumor. Numerous small, sub 4 mm pulmonary nodules appears stable. No new pulmonary lesions. No acute pulmonary findings. No pleural effusion. Upper Abdomen: No to significant upper abdominal findings. Stable cholelithiasis and right renal cyst. No worrisome hepatic or adrenal gland lesions. Diffuse fatty infiltration of the liver suspected. Stable elongated upper pole left renal lesion, most likely a benign complex cysts. No significant change since 2016. Musculoskeletal: No significant bony findings. Stable surgical changes from a left thoracotomy. IMPRESSION: 1. Stable postoperative changes involving the right lung. No findings  suspicious for recurrent tumor. 2. Stable borderline hilar lymph nodes. 3. Numerous small scattered sub 4 mm pulmonary nodules appears stable and are likely benign. 4. Stable emphysematous changes. 5. Stable tortuosity, ectasia and calcification of the thoracic aorta and branch vessels including the coronary arteries. 6. Stable left upper pole renal lesion and right renal cysts. Five Electronically Signed   By: Marijo Sanes M.D.   On: 05/06/2017 14:38   ASSESSMENT AND PLAN:  This is a very pleasant 71 years old African-American male with history of stage IB non-small cell lung cancer status post right upper lobectomy with mediastinal lymph node dissection and has been observation since 2007. The recent CT scan of the chest showed no concerning findings for disease recurrence.  The patient continues to have numerous small scattered subcentimeter nodules that has been stable and likely benign.  I discussed the scan results with the patient today and recommended for him to continue on observation  with his primary care physician. I will be happy to see the patient on as-needed basis at this point. He was advised to call if he has any concerning symptoms. The patient voices understanding of current disease status and treatment options and is in agreement with the current care plan. All questions were answered. The patient knows to call the clinic with any problems, questions or concerns. We can certainly see the patient much sooner if necessary. I spent 10 minutes counseling the patient face to face. The total time spent in the appointment was 15 minutes. Disclaimer: This note was dictated with voice recognition software. Similar sounding words can inadvertently be transcribed and may be missed upon review.

## 2017-05-14 ENCOUNTER — Other Ambulatory Visit: Payer: Self-pay

## 2017-05-14 ENCOUNTER — Encounter: Payer: Self-pay | Admitting: Internal Medicine

## 2017-05-14 ENCOUNTER — Ambulatory Visit (AMBULATORY_SURGERY_CENTER): Payer: Medicare HMO | Admitting: Internal Medicine

## 2017-05-14 VITALS — BP 139/75 | HR 68 | Temp 96.0°F | Resp 22 | Ht 72.0 in | Wt 196.0 lb

## 2017-05-14 DIAGNOSIS — D122 Benign neoplasm of ascending colon: Secondary | ICD-10-CM | POA: Diagnosis not present

## 2017-05-14 DIAGNOSIS — D123 Benign neoplasm of transverse colon: Secondary | ICD-10-CM

## 2017-05-14 DIAGNOSIS — D124 Benign neoplasm of descending colon: Secondary | ICD-10-CM | POA: Diagnosis not present

## 2017-05-14 DIAGNOSIS — D120 Benign neoplasm of cecum: Secondary | ICD-10-CM

## 2017-05-14 DIAGNOSIS — Z86010 Personal history of colonic polyps: Secondary | ICD-10-CM | POA: Diagnosis not present

## 2017-05-14 DIAGNOSIS — Z1211 Encounter for screening for malignant neoplasm of colon: Secondary | ICD-10-CM | POA: Diagnosis not present

## 2017-05-14 DIAGNOSIS — I2510 Atherosclerotic heart disease of native coronary artery without angina pectoris: Secondary | ICD-10-CM | POA: Diagnosis not present

## 2017-05-14 MED ORDER — SODIUM CHLORIDE 0.9 % IV SOLN: 500.0000 mL | INTRAVENOUS | Status: DC

## 2017-05-14 NOTE — Progress Notes (Signed)
Called to room to assist during endoscopic procedure.  Patient ID and intended procedure confirmed with present staff. Received instructions for my participation in the procedure from the performing physician.  

## 2017-05-14 NOTE — Progress Notes (Signed)
A/ox3 pleased with MAC, report to Jane RN 

## 2017-05-14 NOTE — Patient Instructions (Signed)
YOU HAD AN ENDOSCOPIC PROCEDURE TODAY AT  [facility]:   Refer to the procedure report that was given to you for any specific questions about what was found during the examination.  If the procedure report does not answer your questions, please call your gastroenterologist to clarify.  If you requested that your care partner not be given the details of your procedure findings, then the procedure report has been included in a sealed envelope for you to review at your convenience later.  YOU SHOULD EXPECT: Some feelings of bloating in the abdomen. Passage of more gas than usual.  Walking can help get rid of the air that was put into your GI tract during the procedure and reduce the bloating. If you had a lower endoscopy (such as a colonoscopy or flexible sigmoidoscopy) you may notice spotting of blood in your stool or on the toilet paper. If you underwent a bowel prep for your procedure, you may not have a normal bowel movement for a few days.  Please Note:  You might notice some irritation and congestion in your nose or some drainage.  This is from the oxygen used during your procedure.  There is no need for concern and it should clear up in a day or so.  SYMPTOMS TO REPORT IMMEDIATELY:   Following lower endoscopy (colonoscopy or flexible sigmoidoscopy):  Excessive amounts of blood in the stool  Significant tenderness or worsening of abdominal pains  Swelling of the abdomen that is new, acute  Fever of 100F or higher   Following upper endoscopy (EGD)  Vomiting of blood or coffee ground material  New chest pain or pain under the shoulder blades  Painful or persistently difficult swallowing  New shortness of breath  Fever of 100F or higher  Black, tarry-looking stools  For urgent or emergent issues, a gastroenterologist can be reached at any hour by calling [phone].   DIET:  We do recommend a small meal at first, but then you may proceed to your regular diet.  Drink  plenty of fluids but you should avoid alcoholic beverages for 24 hours.  ACTIVITY:  You should plan to take it easy for the rest of today and you should NOT DRIVE or use heavy machinery until tomorrow (because of the sedation medicines used during the test).    FOLLOW UP: Our staff will call the number listed on your records the next business day following your procedure to check on you and address any questions or concerns that you may have regarding the information given to you following your procedure. If we do not reach you, we will leave a message.  However, if you are feeling well and you are not experiencing any problems, there is no need to return our call.  We will assume that you have returned to your regular daily activities without incident.  If any biopsies were taken you will be contacted by phone or by letter within the next 1-3 weeks.  Please call us at [phone] if you have not heard about the biopsies in 3 weeks.    SIGNATURES/CONFIDENTIALITY: You and/or your care partner have signed paperwork which will be entered into your electronic medical record.  These signatures attest to the fact that that the information above on your After Visit Summary has been reviewed and is understood.  Full responsibility of the confidentiality of this discharge information lies with you and/or your care-partner.  Polyp information given.  No aspirin, ibuprofen, naproxen or other non  steroidal anti inflammatory meds for 2 weeks.

## 2017-05-14 NOTE — Progress Notes (Signed)
Pt's states no medical or surgical changes since previsit or office visit. 

## 2017-05-14 NOTE — Op Note (Signed)
[facility] Patient Name: [name] Procedure Date: 05/14/2017 11:09 AM MRN: [id number] Endoscopist: [name] , MD Age: 71 Referring MD:  Date of Birth: May 06, 1946 Gender: Male Account #: [id number] Procedure:                Colonoscopy Indications:              Surveillance: Personal history of adenomatous                            polyps on last colonoscopy 5 years ago Medicines:                Propofol per Anesthesia, Monitored Anesthesia Care Procedure:                Pre-Anesthesia Assessment:                           - Prior to the procedure, a History and Physical                            was performed, and patient medications and                            allergies were reviewed. The patient's tolerance of                            previous anesthesia was also reviewed. The risks                            and benefits of the procedure and the sedation                            options and risks were discussed with the patient.                            All questions were answered, and informed consent                            was obtained. Prior Anticoagulants: The patient has                            taken no previous anticoagulant or antiplatelet                            agents. ASA Grade Assessment: III - A patient with                            severe systemic disease. After reviewing the risks                            and benefits, the patient was deemed in                            satisfactory condition to undergo the procedure.  After obtaining informed consent, the colonoscope                            was passed under direct vision. Throughout the                            procedure, the patient's blood pressure, pulse, and                            oxygen saturations were monitored continuously. The                            Model CF-HQ190L [id number]) scope was introduced     through the anus and advanced to the the cecum,                            identified by appendiceal orifice and ileocecal                            valve. The colonoscopy was somewhat difficult due                            to significant looping. Successful completion of                            the procedure was aided by applying abdominal                            pressure. The quality of the bowel preparation was                            good. The bowel preparation used was Miralax. The                            ileocecal valve, appendiceal orifice, and rectum                            were photographed. The patient tolerated the                            procedure well. Scope In: 11:22:47 AM Scope Out: 12:01:04 PM Scope Withdrawal Time: 0 hours 32 minutes 23 seconds  Total Procedure Duration: 0 hours 38 minutes 17 seconds  Findings:                 The perianal examination was normal.                           The digital rectal exam findings include enlarged                            prostate. Pertinent negatives include no palpable                            rectal lesions.  Two sessile polyps were found in the descending                            colon and ascending colon. The polyps were 11 to 12                            mm in size. These polyps were removed with a hot                            snare. Resection and retrieval were complete.                            Verification of patient identification for the                            specimen was done. Estimated blood loss: none.                           Multiple sessile polyps were found in the                            descending colon, transverse colon, ascending colon                            and cecum. The polyps were 3 to 8 mm in size. These                            polyps were removed with a cold snare. Resection                            and retrieval were complete.  Verification of                            patient identification for the specimen was done.                            Estimated blood loss was minimal.                           The exam was otherwise without abnormality on                            direct and retroflexion views. Complications:            No immediate complications. Estimated Blood Loss:     Estimated blood loss was minimal. Impression:               - Enlarged prostate found on digital rectal exam.                           - Two 11 to 12 mm polyps in the descending colon  and in the ascending colon, removed with a hot                            snare. Resected and retrieved.                           - Multiple 3 to 8 mm polyps in the descending                            colon, in the transverse colon, in the ascending                            colon and in the cecum, removed with a cold snare.                            Resected and retrieved.                           - The examination was otherwise normal on direct                            and retroflexion views.                           - 18 total polyps removed today                           - Personal history of colonic polyps. Adenomas Recommendation:           - Patient has a contact number available for                            emergencies. The signs and symptoms of potential                            delayed complications were discussed with the                            patient. Return to normal activities tomorrow.                            Written discharge instructions were provided to the                            patient.                           - Resume previous diet.                           - Continue present medications.                           - No aspirin, ibuprofen, naproxen, or other  non-steroidal anti-inflammatory drugs for 2 weeks                            after polyp  removal.                           - Repeat colonoscopy is recommended for                            surveillance. The colonoscopy date will be                            determined after pathology results from today's                            exam become available for review. [name], MD 05/14/2017 12:11:57 PM This report has been signed electronically.

## 2017-05-15 ENCOUNTER — Telehealth: Payer: Self-pay | Admitting: *Deleted

## 2017-05-15 NOTE — Telephone Encounter (Signed)
  Follow up Call-  Call back number 05/14/2017 03/29/2015  Post procedure Call Back phone  # [phone]  Permission to leave phone message No No  comments no answering machine -  Some recent data might be hidden     Patient questions:  Do you have a fever, pain , or abdominal swelling? No. Pain Score  0 *  Have you tolerated food without any problems? Yes.    Have you been able to return to your normal activities? Yes.    Do you have any questions about your discharge instructions: Diet   No. Medications  No. Follow up visit  No.  Do you have questions or concerns about your Care? No.  Actions: * If pain score is 4 or above: No action needed, pain <4.

## 2017-05-16 ENCOUNTER — Other Ambulatory Visit: Payer: Self-pay | Admitting: Student in an Organized Health Care Education/Training Program

## 2017-05-19 ENCOUNTER — Encounter: Payer: Self-pay | Admitting: Internal Medicine

## 2017-05-19 NOTE — Progress Notes (Signed)
Please let patients know all polyps are precancerous but benign  Because he had so many I recommend he be evaluated by genetics counselor - could impact how we follow him and if he has kids and siblings could impact them also  [name] - colon recall 1 year 04/2018

## 2017-05-19 NOTE — Telephone Encounter (Signed)
Next appt scheduled  12/17 with PCP.

## 2017-06-02 ENCOUNTER — Encounter: Payer: Self-pay | Admitting: Student in an Organized Health Care Education/Training Program

## 2017-06-02 ENCOUNTER — Ambulatory Visit (INDEPENDENT_AMBULATORY_CARE_PROVIDER_SITE_OTHER): Payer: Medicare HMO | Admitting: Student in an Organized Health Care Education/Training Program

## 2017-06-02 ENCOUNTER — Other Ambulatory Visit: Payer: Self-pay

## 2017-06-02 VITALS — BP 128/66 | HR 60 | Temp 97.6°F | Ht 72.0 in | Wt 196.4 lb

## 2017-06-02 DIAGNOSIS — K635 Polyp of colon: Secondary | ICD-10-CM

## 2017-06-02 DIAGNOSIS — R0981 Nasal congestion: Secondary | ICD-10-CM

## 2017-06-02 DIAGNOSIS — M549 Dorsalgia, unspecified: Secondary | ICD-10-CM

## 2017-06-02 DIAGNOSIS — Z85118 Personal history of other malignant neoplasm of bronchus and lung: Secondary | ICD-10-CM

## 2017-06-02 DIAGNOSIS — Z79899 Other long term (current) drug therapy: Secondary | ICD-10-CM

## 2017-06-02 DIAGNOSIS — J4520 Mild intermittent asthma, uncomplicated: Secondary | ICD-10-CM

## 2017-06-02 DIAGNOSIS — Z86010 Personal history of colonic polyps: Secondary | ICD-10-CM | POA: Diagnosis not present

## 2017-06-02 DIAGNOSIS — F17210 Nicotine dependence, cigarettes, uncomplicated: Secondary | ICD-10-CM

## 2017-06-02 DIAGNOSIS — J45909 Unspecified asthma, uncomplicated: Secondary | ICD-10-CM | POA: Diagnosis not present

## 2017-06-02 DIAGNOSIS — G8929 Other chronic pain: Secondary | ICD-10-CM

## 2017-06-02 DIAGNOSIS — Z79891 Long term (current) use of opiate analgesic: Secondary | ICD-10-CM | POA: Diagnosis not present

## 2017-06-02 DIAGNOSIS — F17200 Nicotine dependence, unspecified, uncomplicated: Secondary | ICD-10-CM

## 2017-06-02 DIAGNOSIS — I10 Essential (primary) hypertension: Secondary | ICD-10-CM | POA: Diagnosis not present

## 2017-06-02 DIAGNOSIS — M25551 Pain in right hip: Secondary | ICD-10-CM | POA: Diagnosis not present

## 2017-06-02 DIAGNOSIS — M25552 Pain in left hip: Secondary | ICD-10-CM

## 2017-06-02 MED ORDER — HYDROCODONE-ACETAMINOPHEN 7.5-325 MG PO TABS: 1.0000 | ORAL_TABLET | Freq: Four times a day (QID) | ORAL | 0 refills | Status: DC | PRN

## 2017-06-02 MED ORDER — ALBUTEROL SULFATE HFA 108 (90 BASE) MCG/ACT IN AERS: 2.0000 | INHALATION_SPRAY | Freq: Four times a day (QID) | RESPIRATORY_TRACT | 2 refills | Status: DC | PRN

## 2017-06-02 NOTE — Progress Notes (Signed)
   Assessment and Plan:  See Encounters tab for problem-based medical decision making.   __________________________________________________________  HPI:   71 year old man here for follow-up of chronic left hip pain.  Patient reports doing very well at home.  He lives by himself, but visits with family regularly.  Since I last saw him he had a colonoscopy last month, I reviewed the results which showed a high number of adenomas.  He is planning to do a repeat colonoscopy in 1 year.  No other symptoms.  He reports good benefit to his chronic opioid medications.  Says that they help his functional status, improve his left hip and back pain.  Denies falls or other side effects.  Says he keeps his medications in a safe place.  Reports good compliance with his other medications.  Denies any chest pain, shortness of breath with exertion.  No recent hospitalizations or emergency department visits.  He does report having a cold-like illness for the last several days.  He has a mildly productive cough with white sputum.  No fevers, hemoptysis, or weight loss.  He recently followed up with his oncologist for surveillance from his stage Ib lung cancer treated with surgical resection.  Dr. Julien Nordmann has released him from surveillance and he will just follow-up as needed.  __________________________________________________________  Problem List: Patient Active Problem List   Diagnosis Date Noted  . Chronic use of opiate for therapeutic purpose 05/01/2015    Priority: High  . Coronary artery disease of native artery of native heart with stable angina pectoris ([place]) 03/17/2012    Priority: High  . Tobacco use disorder 04/18/2006    Priority: High  . Mild alcohol use disorder 12/09/2016    Priority: Medium  . Atherosclerosis of aorta ([place]) 01/13/2015    Priority: Medium  . Prediabetes 12/30/2014    Priority: Medium  . AAA (abdominal aortic aneurysm) ([place])     Priority: Medium  . Essential hypertension  04/18/2006    Priority: Medium  . GERD 04/18/2006    Priority: Medium  . Premature atrial contractions 07/31/2015    Priority: Low  . Healthcare maintenance 06/30/2012    Priority: Low  . Erectile dysfunction 10/12/2010    Priority: Low  . Arthropathy of pelvic region and thigh 05/02/2008    Priority: Low  . 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    Priority: Low  . Gout 04/18/2006    Priority: Low  . Reactive airway disease 06/02/2017    Medications: Reconciled today in Epic __________________________________________________________  Physical Exam:  Vital Signs: Vitals:   06/02/17 1045  BP: 128/66  Pulse: 60  Temp: 97.6 F (36.4 C)  TempSrc: Oral  SpO2: 93%  Weight: 196 lb 6.4 oz (89.1 kg)  Height: 6' (1.829 m)    Gen: Well appearing, NAD Neck: No cervical LAD, No thyromegaly or nodules, No JVD. CV: RRR, no murmurs Pulm: Normal effort, CTA throughout, mild expiratory wheezing on the right, no crackles Ext: Warm, no edema, normal joints Skin: No atypical appearing moles. No rashes

## 2017-06-02 NOTE — Assessment & Plan Note (Signed)
Left hip pain and low back pain or chronic pain generators.  He has used low-dose short acting opioid therapy for several years.  He is not interested in surgical therapy.  Currently doing well with hydrocodone 7.5 mg, 4 times daily (26 MME/day).  He reports the medication is improving his functional status.  Denies any falls or other side effects.  Last urine tox screen was appropriate.  I reviewed the database today which shows appropriate dispensing.  I electronically prescribed a 19-month supply of hydrocodone, patient should follow-up with me within 3 months.

## 2017-06-02 NOTE — Assessment & Plan Note (Signed)
Current symptoms and exam are most consistent with a mild upper respiratory tract infection.  He has mild inspiratory wheezing on exam and sputum production.  He is at risk for COPD given his tobacco use history.  No recent pulmonary function testing on file.  Plan is to trial albuterol inhaler to be used as needed.  Follow-up if worsened or no better in a few weeks.

## 2017-06-02 NOTE — Assessment & Plan Note (Signed)
Patient continues to smoke, he reports cutting back, currently smoking about 5 cigarettes daily.  I spent 5 minutes counseling him on the importance of tobacco cessation, especially given his history of stage Ib lung cancer.  He is currently pre-contemplative but will call us if he wants to learn more about nicotine replacement or pharmacotherapy.

## 2017-06-02 NOTE — Patient Instructions (Signed)
1. I [facility]   2. For your congestion, use the albuterol inhaler four times a day for one week. Then you may use it as needed.

## 2017-06-02 NOTE — Assessment & Plan Note (Signed)
Blood pressure is well controlled today.  Plan to continue metoprolol 50 mg twice daily.

## 2017-06-02 NOTE — Assessment & Plan Note (Signed)
Routine colon cancer screening last month.  Colonoscopy showed 18 polyps which were all resected, largest 12 mm, that were adenomas.  He talked with his GI physician Dr. [name] about this.  Their plan is to repeat colonoscopy in 2019.  Dr. [name] recommended genetics testing to rule out FAP, but the patient declined.

## 2017-06-03 NOTE — Addendum Note (Signed)
Addended by: Lalla Brothers T on: 06/03/2017 02:35 PM   Modules accepted: Orders

## 2017-06-09 IMAGING — NM NM MISC PROCEDURE
6 series · 36 of 36 positions shown · non-contrast
Comparison: none

[Series 1: stress-gsp · 6.40mm/px · 6 of 512 frames shown]
[frame 43/512]
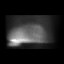
[frame 128/512]
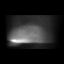
[frame 214/512]
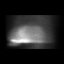
[frame 299/512]
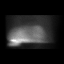
[frame 384/512]
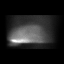
[frame 470/512]
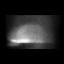

[Series 1: wbr_s-card_st stress-sum-em · 6.4mm · 6.40mm/px · 6 of 24 frames shown]
[frame 3/24]
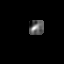
[frame 7/24]
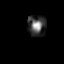
[frame 11/24]
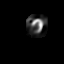
[frame 15/24]
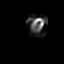
[frame 19/24]
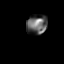
[frame 23/24]
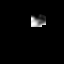

[Series 1: wbr_r-card_st rest · 6.4mm · 6.40mm/px · 6 of 23 frames shown]
[frame 2/23]
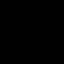
[frame 6/23]
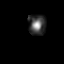
[frame 10/23]
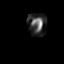
[frame 14/23]
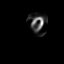
[frame 18/23]
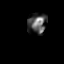
[frame 22/23]
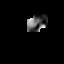

[Series 1: wbr_s-card_st stress-gsp · 6.4mm · 6.40mm/px · 6 of 183 frames shown]
[frame 16/183]
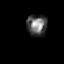
[frame 46/183]
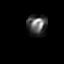
[frame 77/183]
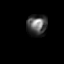
[frame 107/183]
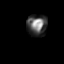
[frame 138/183]
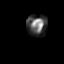
[frame 168/183]
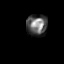

[Series 1: stress-sum-em · 6.40mm/px · 6 of 64 frames shown]
[frame 6/64]
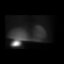
[frame 16/64]
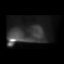
[frame 27/64]
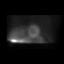
[frame 38/64]
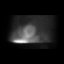
[frame 48/64]
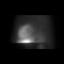
[frame 59/64]
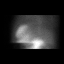

[Series 1: rest · 6.40mm/px · 6 of 64 frames shown]
[frame 6/64]
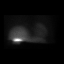
[frame 16/64]
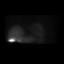
[frame 27/64]
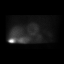
[frame 38/64]
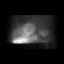
[frame 48/64]
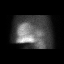
[frame 59/64]
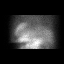

[36 of 36 positions shown; findings below may reference images not displayed]

Canned report from images found in remote index.

Refer to host system for actual result text.

## 2017-06-15 IMAGING — CT CT CHEST W/O CM
2 of 5 series · 14 of 36 positions shown, 17 images · non-contrast
Comparison: Chest CT 01/26/2014.

CLINICAL DATA: 69-year-old male with shortness of breath. Restaging
lung cancer (right-sided lung cancer originally diagnosed in 7666
status post right upper lobectomy).

EXAM:
CT CHEST WITHOUT CONTRAST
TECHNIQUE: Multidetector CT imaging of the chest was performed following the
standard protocol without IV contrast.

[Series 2: chest w/o st · axial · non-contrast · 0.75mm/px · z∈[-280,-30]mm · 11 of 61 slices shown, 14 images]
[im 6/61  mediastinal]
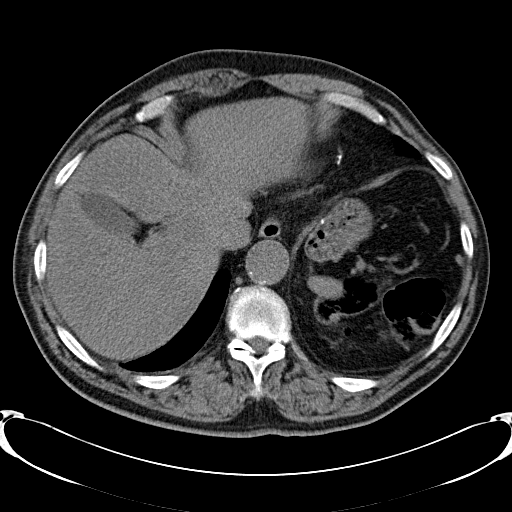
[im 6/61  lung]
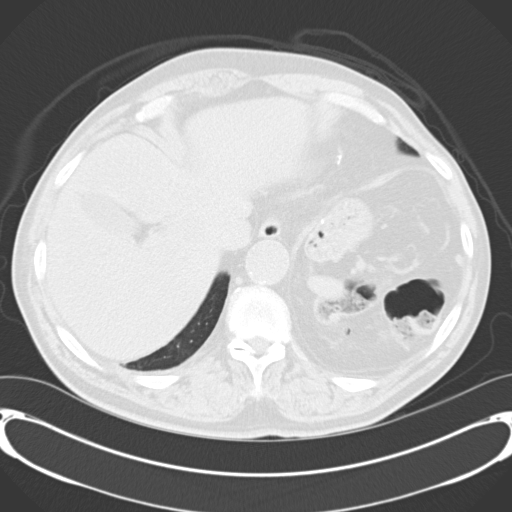
[im 11/61  lung]
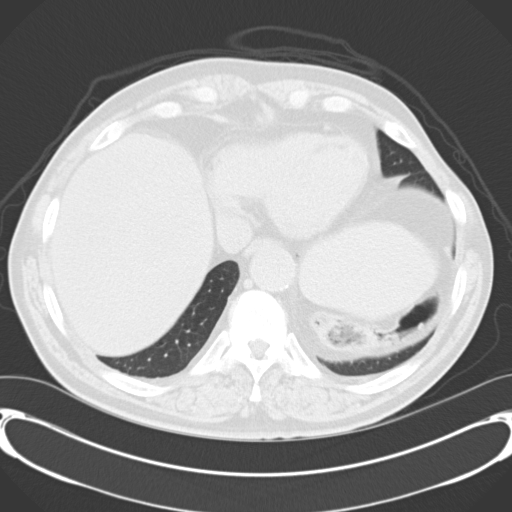
[im 16/61  lung]
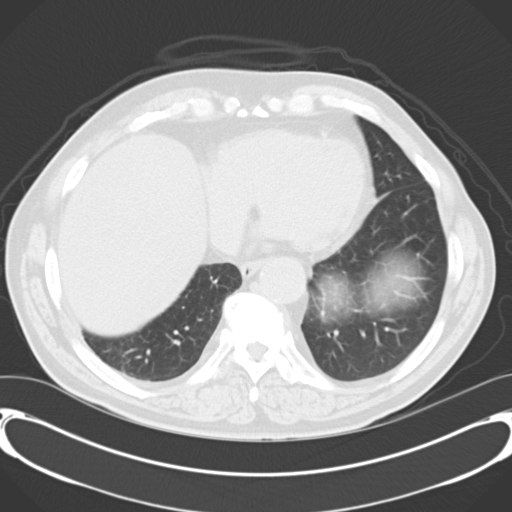
[im 21/61  lung]
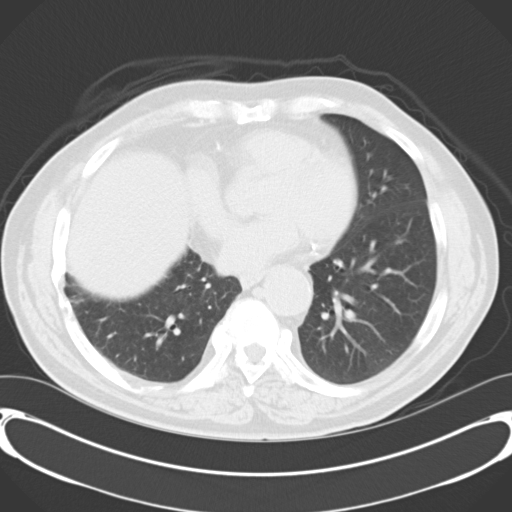
[im 26/61  mediastinal]
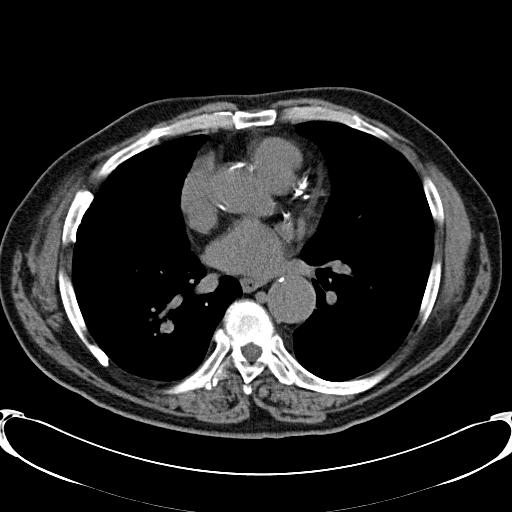
[im 26/61  lung]
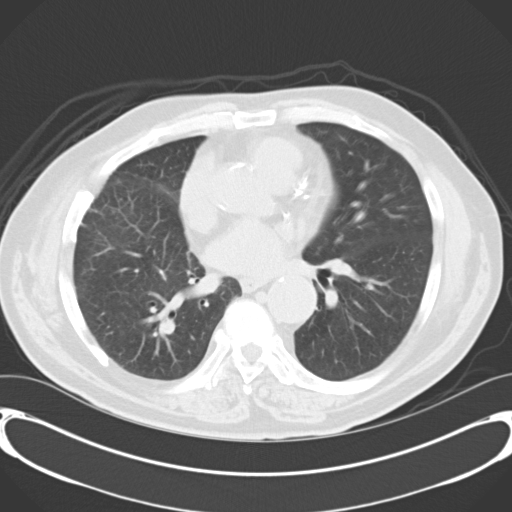
[im 31/61  lung]
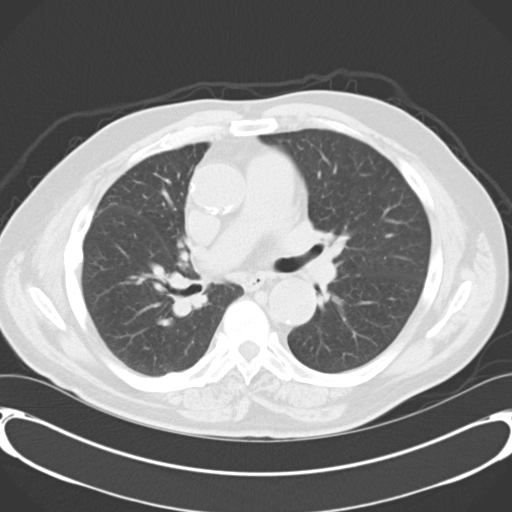
[im 36/61  lung]
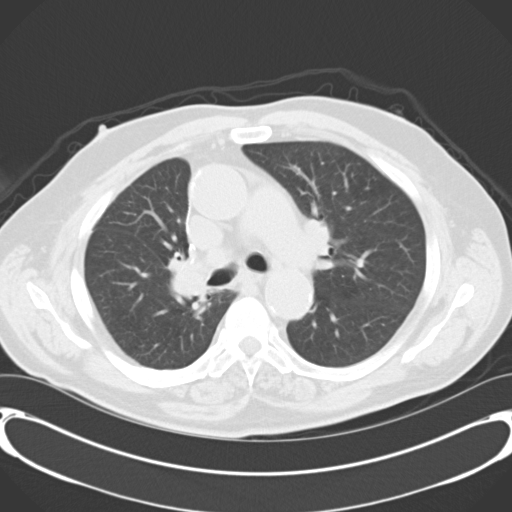
[im 41/61  lung]
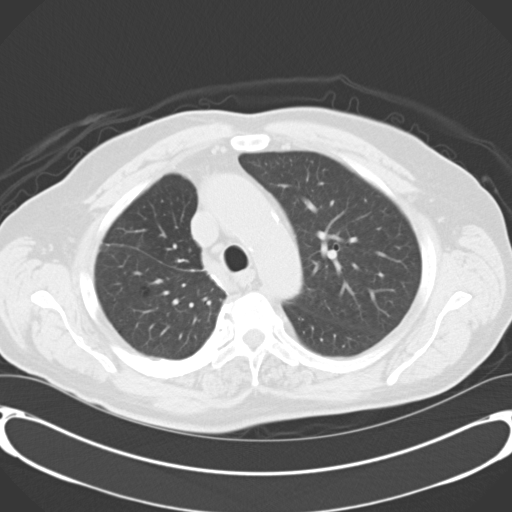
[im 46/61  mediastinal]
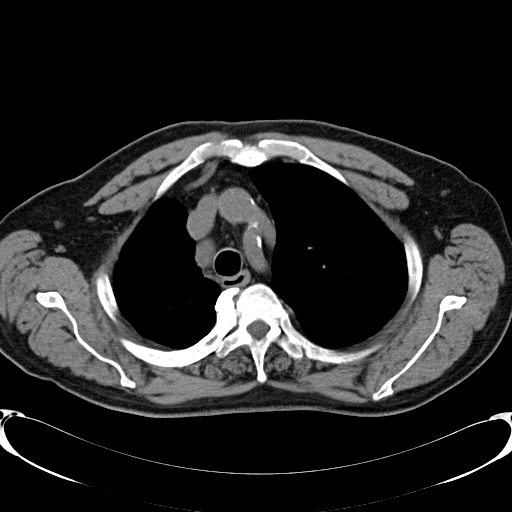
[im 46/61  lung]
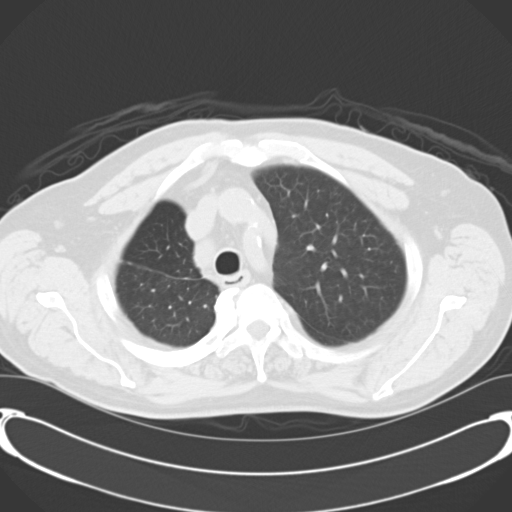
[im 51/61  lung]
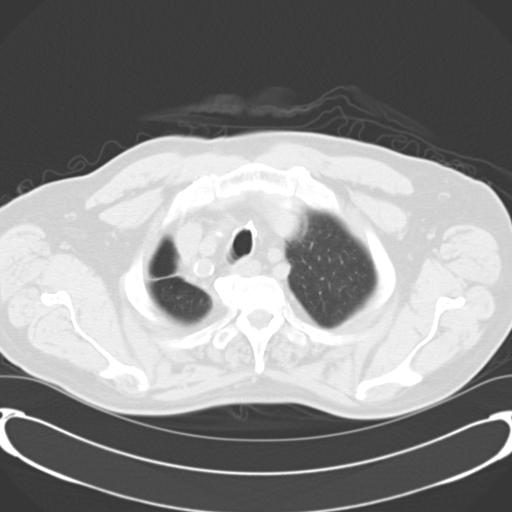
[im 56/61  lung]
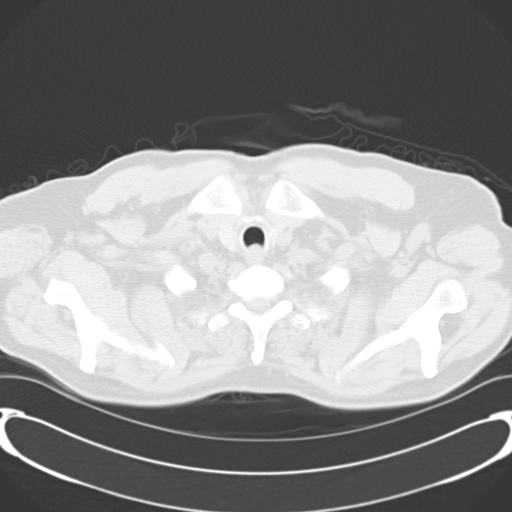

[Series 602: <mpr thick range> · coronal · 0.75mm/px · 3 of 90 slices shown]
[im 18/90  lung]
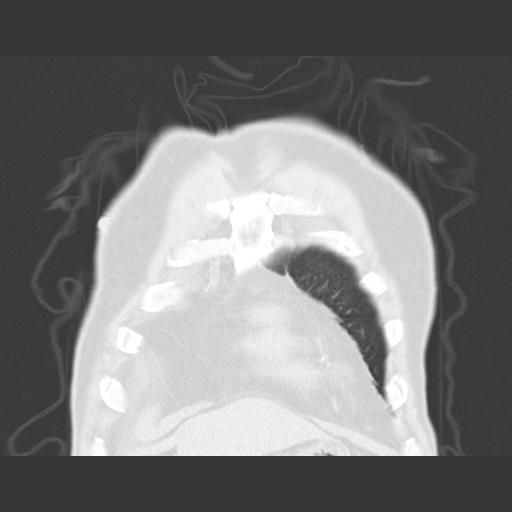
[im 36/90  lung]
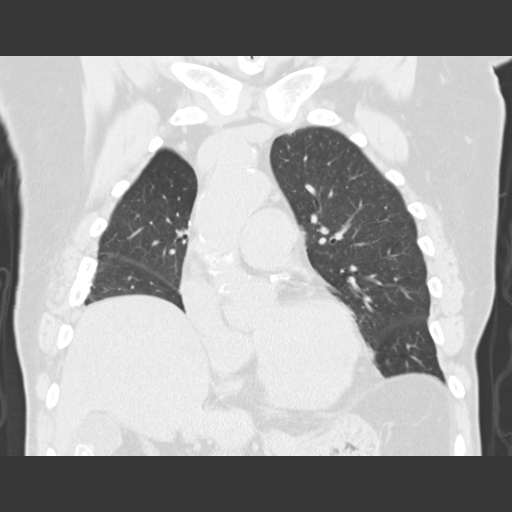
[im 54/90  lung]
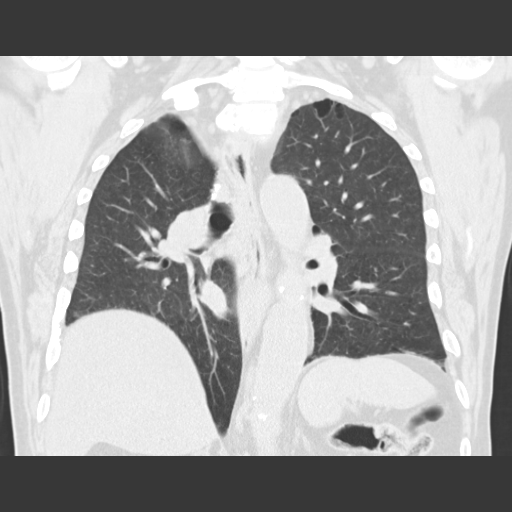

[14 of 36 positions shown; findings below may reference images not displayed]

FINDINGS: Mediastinum/Lymph Nodes: Heart size is normal. There is no
significant pericardial fluid, thickening or pericardial
calcification. There is atherosclerosis of the thoracic aorta, the
great vessels of the mediastinum and the coronary arteries,
including calcified atherosclerotic plaque in the left main, left
anterior descending, left circumflex and right coronary arteries.
Extensive curvilinear hypoattenuation is noted throughout the left
ventricular myocardium, compatible with multiple areas of fibrofatty
metaplasia suggesting multiple prior myocardial infarctions. Mild
dilatation of the pulmonic trunk (3.5 cm in diameter). No
pathologically enlarged mediastinal or hilar lymph nodes. Please
note that accurate exclusion of hilar adenopathy is limited on
noncontrast CT scans. Esophagus is unremarkable in appearance. No
axillary lymphadenopathy.

Lungs/Pleura: Status post right upper lobectomy. Compensatory
hyperexpansion of the right middle and lower lobes. No suspicious
appearing pulmonary nodules or masses. Mild centrilobular and
paraseptal emphysema. No acute consolidative airspace disease. No
pleural effusions.

Upper Abdomen: Diffuse low attenuation throughout the hepatic
parenchyma, compatible with hepatic steatosis. Calcified gallstones
noted in the gallbladder. Incompletely visualized intermediate to
high attenuation lesions in the posterior aspect of the upper pole
of the left kidney appears similar to the prior examination, likely
small proteinaceous cysts, largest of which measures 1 cm.

Musculoskeletal/Soft Tissues: Postthoracotomy changes in the right
hemithorax. There are no aggressive appearing lytic or blastic
lesions noted in the visualized portions of the skeleton.
IMPRESSION: 1. Stable postoperative findings following right upper lobectomy,
without evidence of recurrent disease or metastatic disease in the
thorax.
2. Mild dilatation of the pulmonic trunk (3.5 cm in diameter)
suggesting underlying pulmonary arterial hypertension.
3. Atherosclerosis, including left main and 3 vessel coronary artery
disease. Evidence of multiple prior myocardial infarctions with
areas of fibrofatty metaplasia throughout the left ventricular
myocardium. Assessment for potential risk factor modification,
dietary therapy or pharmacologic therapy may be warranted, if
clinically indicated.
4. Hepatic steatosis.
5. Cholelithiasis.
6. Additional incidental findings, similar to prior examinations, as
above.

## 2017-09-01 ENCOUNTER — Encounter: Payer: Self-pay | Admitting: Student in an Organized Health Care Education/Training Program

## 2017-09-01 ENCOUNTER — Ambulatory Visit (INDEPENDENT_AMBULATORY_CARE_PROVIDER_SITE_OTHER): Payer: Medicare HMO | Admitting: Student in an Organized Health Care Education/Training Program

## 2017-09-01 VITALS — BP 140/66 | HR 70 | Temp 98.0°F | Wt 197.3 lb

## 2017-09-01 DIAGNOSIS — I714 Abdominal aortic aneurysm, without rupture, unspecified: Secondary | ICD-10-CM

## 2017-09-01 DIAGNOSIS — F1010 Alcohol abuse, uncomplicated: Secondary | ICD-10-CM

## 2017-09-01 DIAGNOSIS — G8929 Other chronic pain: Secondary | ICD-10-CM

## 2017-09-01 DIAGNOSIS — F17210 Nicotine dependence, cigarettes, uncomplicated: Secondary | ICD-10-CM | POA: Diagnosis not present

## 2017-09-01 DIAGNOSIS — M25551 Pain in right hip: Secondary | ICD-10-CM

## 2017-09-01 DIAGNOSIS — Z79891 Long term (current) use of opiate analgesic: Secondary | ICD-10-CM | POA: Diagnosis not present

## 2017-09-01 DIAGNOSIS — M25552 Pain in left hip: Secondary | ICD-10-CM | POA: Diagnosis not present

## 2017-09-01 DIAGNOSIS — F17200 Nicotine dependence, unspecified, uncomplicated: Secondary | ICD-10-CM

## 2017-09-01 DIAGNOSIS — F1099 Alcohol use, unspecified with unspecified alcohol-induced disorder: Secondary | ICD-10-CM | POA: Diagnosis not present

## 2017-09-01 MED ORDER — HYDROCODONE-ACETAMINOPHEN 7.5-325 MG PO TABS: 1.0000 | ORAL_TABLET | Freq: Four times a day (QID) | ORAL | 0 refills | Status: DC | PRN

## 2017-09-01 NOTE — Assessment & Plan Note (Signed)
Clinically stable.  Patient is having very good benefit from current regimen of hydrocodone, controls his left hip pain and improve his functional status.  He is having no adverse effects from the medication that I can tell.  Plan is to continue with hydrocodone 7.5 mg 3-4 tablets/day.  [facility]  I reviewed the controlled substance reporting system which was appropriate.  Last U tox was June 2018, will get another one at next visit.

## 2017-09-01 NOTE — Progress Notes (Signed)
   Assessment and Plan:  See Encounters tab for problem-based medical decision making.   __________________________________________________________  HPI:   72 year old man here for follow-up of left hip pain for which he uses hydrocodone 3-4 tablets/day for many years.  Reports doing well on this regimen.  He is independent and very functional.  He reports his pain is well controlled and he has a high quality of life.  Denies any falls or other adverse side effects recently.  No recent illnesses.  He lives at home by himself, has some brothers and sisters that live in [place], has a strong social network.  He is an avid [employer], likes to watch the basketball games with his friends  which is also when he drinks the most alcohol.  Drinks per day, but he still drinks about 1-2 can drinks many more during games, he declines to quantify for me.  He continues to smoke, one pack lasts him about 2-1/2 days.  Denies any dyspnea on exertion, says that his breathing is feeling better than last time he was here.  Denies any chest pain or pressure.  No fevers or chills.  Eating and drinking well, weight is stable with no unintentional weight loss.  __________________________________________________________  Problem List: Patient Active Problem List   Diagnosis Date Noted  . Chronic use of opiate for therapeutic purpose 05/01/2015    Priority: High  . Coronary artery disease of native artery of native heart with stable angina pectoris ([place]) 03/17/2012    Priority: High  . Tobacco use disorder 04/18/2006    Priority: High  . Mild alcohol use disorder 12/09/2016    Priority: Medium  . Atherosclerosis of aorta ([place]) 01/13/2015    Priority: Medium  . Prediabetes 12/30/2014    Priority: Medium  . AAA (abdominal aortic aneurysm) ([place])     Priority: Medium  . Essential hypertension 04/18/2006    Priority: Medium  . GERD 04/18/2006    Priority: Medium  . Premature atrial contractions  07/31/2015    Priority: Low  . Healthcare maintenance 06/30/2012    Priority: Low  . Erectile dysfunction 10/12/2010    Priority: Low  . Arthropathy of pelvic region and thigh 05/02/2008    Priority: Low  . 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    Priority: Low  . Gout 04/18/2006    Priority: Low    Medications: Reconciled today in Epic __________________________________________________________  Physical Exam:  Vital Signs: Vitals:   09/01/17 0935  BP: 140/66  Pulse: 70  Temp: 98 F (36.7 C)  TempSrc: Oral  SpO2: 94%  Weight: 197 lb 4.8 oz (89.5 kg)    Gen: Well appearing, NAD CV: RRR, no murmurs Pulm: Normal effort, CTA throughout, no wheezing Ext: Warm, no edema, moderate crepitus of left knee, no pain in the left hip with internal rotation Skin: No atypical appearing moles. No rashes

## 2017-09-01 NOTE — Assessment & Plan Note (Signed)
Asymptomatic infrarenal 3.3 cm abdominal aortic aneurysm first seen in 2014.  He is due for a follow-up abdominal ultrasound this year which I have ordered.

## 2017-09-01 NOTE — Assessment & Plan Note (Signed)
Continues to actively trying to reduce cigarette smoking.  Smokes about 1/2pack/day at this point.  I spent 5 minutes counseling him on the importance of tobacco cessation especially given his history of lung cancer and coronary artery disease.  Patient understands, going to continue to try nicotine replacement patches and gum as able.

## 2017-09-01 NOTE — Assessment & Plan Note (Signed)
Mild alcohol use disorder, drinking more than recommended with some binge type behavior no signs of cirrhosis on exam.  We will plan to check liver function tests in about 3 months at this time.  Patient is not interested in naltrexone therapy at this time.

## 2017-09-11 DIAGNOSIS — H04123 Dry eye syndrome of bilateral lacrimal glands: Secondary | ICD-10-CM | POA: Diagnosis not present

## 2017-09-11 DIAGNOSIS — H40053 Ocular hypertension, bilateral: Secondary | ICD-10-CM | POA: Diagnosis not present

## 2017-09-11 DIAGNOSIS — Z961 Presence of intraocular lens: Secondary | ICD-10-CM | POA: Diagnosis not present

## 2017-09-11 DIAGNOSIS — H10413 Chronic giant papillary conjunctivitis, bilateral: Secondary | ICD-10-CM | POA: Diagnosis not present

## 2017-09-19 ENCOUNTER — Other Ambulatory Visit: Payer: Self-pay | Admitting: Student in an Organized Health Care Education/Training Program

## 2017-09-19 ENCOUNTER — Ambulatory Visit (HOSPITAL_COMMUNITY)
Admission: RE | Admit: 2017-09-19 | Discharge: 2017-09-19 | Disposition: A | Discharge status: Home / Self Care | Payer: Medicare HMO | Source: Ambulatory Visit | Attending: Student in an Organized Health Care Education/Training Program | Admitting: Student in an Organized Health Care Education/Training Program

## 2017-09-19 DIAGNOSIS — I714 Abdominal aortic aneurysm, without rupture, unspecified: Secondary | ICD-10-CM

## 2017-09-22 ENCOUNTER — Telehealth: Payer: Self-pay | Admitting: Student in an Organized Health Care Education/Training Program

## 2017-09-22 NOTE — Telephone Encounter (Signed)
I called [name] about abd [place] results. AAA is a little larger at 3.9 cm, but otherwise unchanged. I told him plan is to recheck in 2 years for surveillance. He understands.

## 2017-10-20 ENCOUNTER — Other Ambulatory Visit: Payer: Self-pay | Admitting: Student in an Organized Health Care Education/Training Program

## 2017-10-20 DIAGNOSIS — I25118 Atherosclerotic heart disease of native coronary artery with other forms of angina pectoris: Secondary | ICD-10-CM

## 2017-11-24 ENCOUNTER — Ambulatory Visit (INDEPENDENT_AMBULATORY_CARE_PROVIDER_SITE_OTHER): Payer: Medicare HMO | Admitting: Student in an Organized Health Care Education/Training Program

## 2017-11-24 ENCOUNTER — Other Ambulatory Visit: Payer: Self-pay

## 2017-11-24 ENCOUNTER — Encounter: Payer: Self-pay | Admitting: Student in an Organized Health Care Education/Training Program

## 2017-11-24 VITALS — BP 136/70 | HR 63 | Temp 97.9°F | Wt 195.2 lb

## 2017-11-24 DIAGNOSIS — F17200 Nicotine dependence, unspecified, uncomplicated: Secondary | ICD-10-CM

## 2017-11-24 DIAGNOSIS — M1A0610 Idiopathic chronic gout, right knee, without tophus (tophi): Secondary | ICD-10-CM

## 2017-11-24 DIAGNOSIS — M109 Gout, unspecified: Secondary | ICD-10-CM

## 2017-11-24 DIAGNOSIS — M1612 Unilateral primary osteoarthritis, left hip: Secondary | ICD-10-CM

## 2017-11-24 DIAGNOSIS — M25552 Pain in left hip: Secondary | ICD-10-CM

## 2017-11-24 DIAGNOSIS — Z79891 Long term (current) use of opiate analgesic: Secondary | ICD-10-CM

## 2017-11-24 DIAGNOSIS — Z79899 Other long term (current) drug therapy: Secondary | ICD-10-CM

## 2017-11-24 DIAGNOSIS — I10 Essential (primary) hypertension: Secondary | ICD-10-CM | POA: Diagnosis not present

## 2017-11-24 DIAGNOSIS — M25551 Pain in right hip: Secondary | ICD-10-CM

## 2017-11-24 MED ORDER — HYDROCODONE-ACETAMINOPHEN 7.5-325 MG PO TABS: 1.0000 | ORAL_TABLET | Freq: Three times a day (TID) | ORAL | 0 refills | Status: DC | PRN

## 2017-11-24 NOTE — Patient Instructions (Signed)
You are doing very well, keep up the good work.  I am glad to hear that your pain is still well controlled.  We talked about reducing the amount of pain medication you are using to 3 tablets a day.  Follow-up with [place] in 3 months for your next refills.

## 2017-11-24 NOTE — Assessment & Plan Note (Signed)
Blood pressure is controlled today, may be a little borderline high.  Overall I think we have good control with amlodipine 10 and metoprolol 50 twice daily.  The biggest thing of watching his his abdominal aortic aneurysm which is been stable on imaging over the last 6 years.

## 2017-11-24 NOTE — Progress Notes (Signed)
   Assessment and Plan:  See Encounters tab for problem-based medical decision making.   __________________________________________________________  HPI:   72 year old man here for follow-up of chronic left hip pain.  Doing very well, no acute complaints.  Lives independently by himself.  He exercises by cleaning up his house and doing a little bit of yard work.  Reports that his pain is been very well controlled with hydrocodone.  Takes 3-4 tablets/day.  Sometimes has leftovers at the end of the month.  No side effects, no falls, denies constipation.  No fevers or chills, no recent illnesses.  No chest pain or dyspnea with exertion.  No other recent changes in his medications.  Reports good compliance with all his medications without other side effects.  __________________________________________________________  Problem List: Patient Active Problem List   Diagnosis Date Noted  . Chronic use of opiate for therapeutic purpose 05/01/2015    Priority: High  . Coronary artery disease of native artery of native heart with stable angina pectoris ([place]) 03/17/2012    Priority: High  . Tobacco use disorder 04/18/2006    Priority: High  . Mild alcohol use disorder 12/09/2016    Priority: Medium  . Atherosclerosis of aorta ([place]) 01/13/2015    Priority: Medium  . Prediabetes 12/30/2014    Priority: Medium  . AAA (abdominal aortic aneurysm) ([place])     Priority: Medium  . Essential hypertension 04/18/2006    Priority: Medium  . GERD 04/18/2006    Priority: Medium  . Premature atrial contractions 07/31/2015    Priority: Low  . Healthcare maintenance 06/30/2012    Priority: Low  . Erectile dysfunction 10/12/2010    Priority: Low  . Arthropathy of pelvic region and thigh 05/02/2008    Priority: Low  . 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    Priority: Low  . Gout 04/18/2006    Priority: Low    Medications: Reconciled today in  Epic __________________________________________________________  Physical Exam:  Vital Signs: Vitals:   11/24/17 0954  BP: 136/70  Pulse: 63  Temp: 97.9 F (36.6 C)  TempSrc: Oral  SpO2: 95%  Weight: 195 lb 3.2 oz (88.5 kg)    Gen: Well appearing, NAD Neck: No cervical LAD, No thyromegaly or nodules, No JVD. CV: RRR, no murmurs Pulm: Normal effort, CTA throughout, no wheezing Ext: Warm, no edema, normal joints, left hip internal and external rotation is limited by stiffness but no significant pain. Skin: No atypical appearing moles. No rashes

## 2017-11-24 NOTE — Assessment & Plan Note (Signed)
Stable, one small little flare of his foot last week but was very minor and self resolved.  Plan to continue with allopurinol 100 mg daily.  Check some blood work probably at his next visit and I will check his uric acid level then.

## 2017-11-24 NOTE — Assessment & Plan Note (Signed)
Patient continues to smoke daily, though he is trying to cut back.  We spent 4 minutes talking about the importance of tobacco cessation especially given his vascular disease.  Patient is not interested in other pharmacologic interventions right now, instead taking a slow wean approach.

## 2017-11-24 NOTE — Assessment & Plan Note (Signed)
Stable, chronic pain generator is left hip osteoarthritis and intermittent low back pain is been doing very well for many years on hydrocodone 4 tablets daily.  He has had no adverse side effects, no high risk here.  Pain is very well controlled currently on this dosage, he is having some leftover tablets each month.  I think this is a good time to see if we can decrease his usage down to 3 tablets daily, 90 for the month.  This might be helpful as he is getting more advanced in age to reduce his risk of falls and other side effects.  Gave patient a 50-month supply.  Checking a urine substance test today.

## 2017-12-01 LAB — TOXASSURE SELECT,+ANTIDEPR,UR

## 2017-12-20 IMAGING — DX DG TOE 3RD 2+V*R*
3 series · 3 of 3 positions shown · non-contrast
Comparison: Right foot series of May 24, 2014

CLINICAL DATA: Right third toe pain for the past 2 weeks, cutaneous
callous formation on the tip and medially

EXAM:
RIGHT THIRD TOE

[x toes ap right]
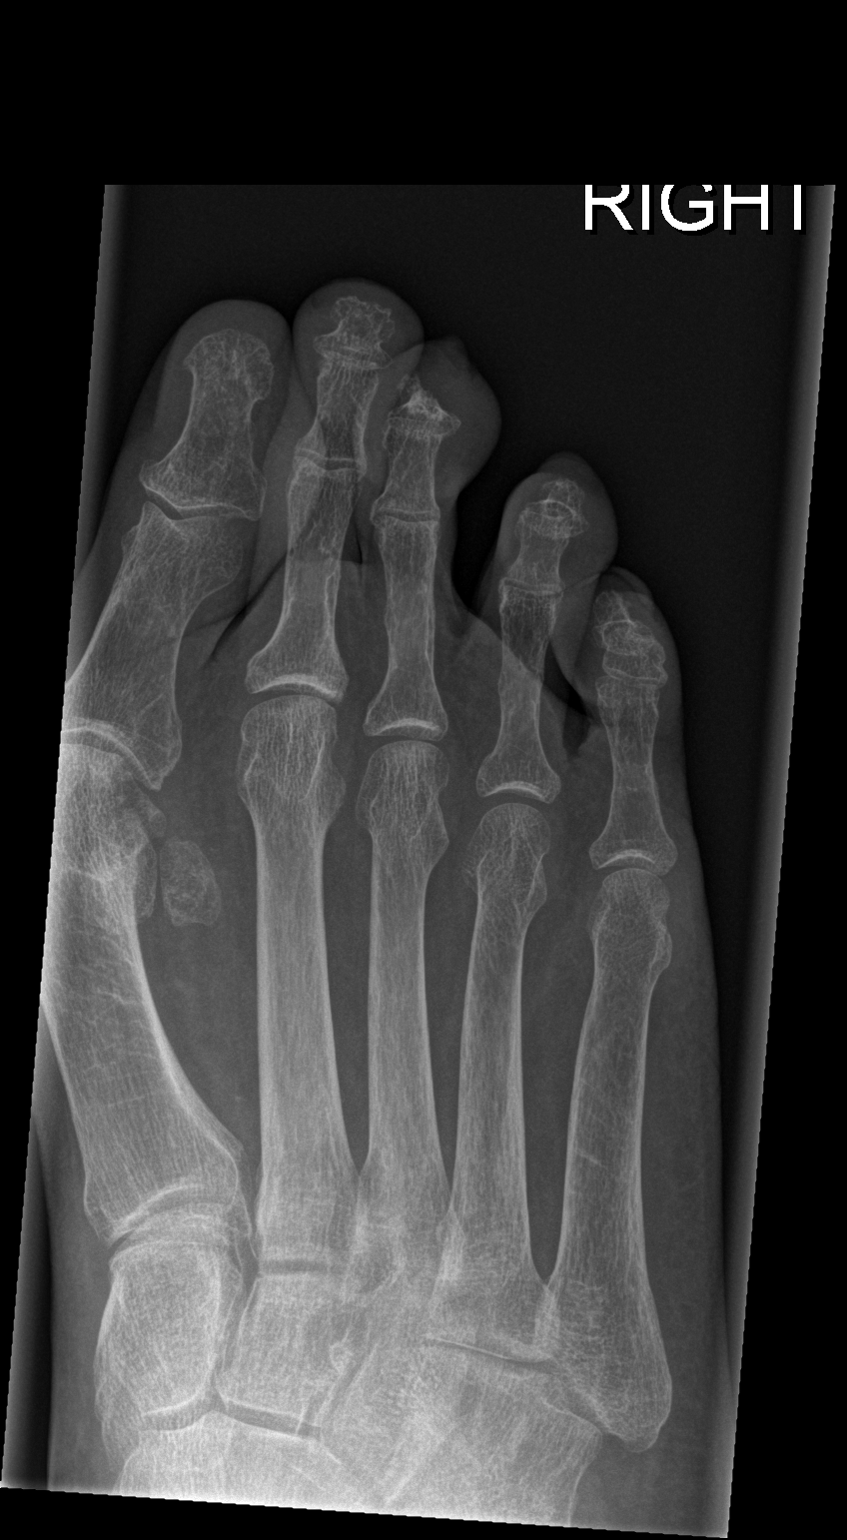

[x toes obl right]
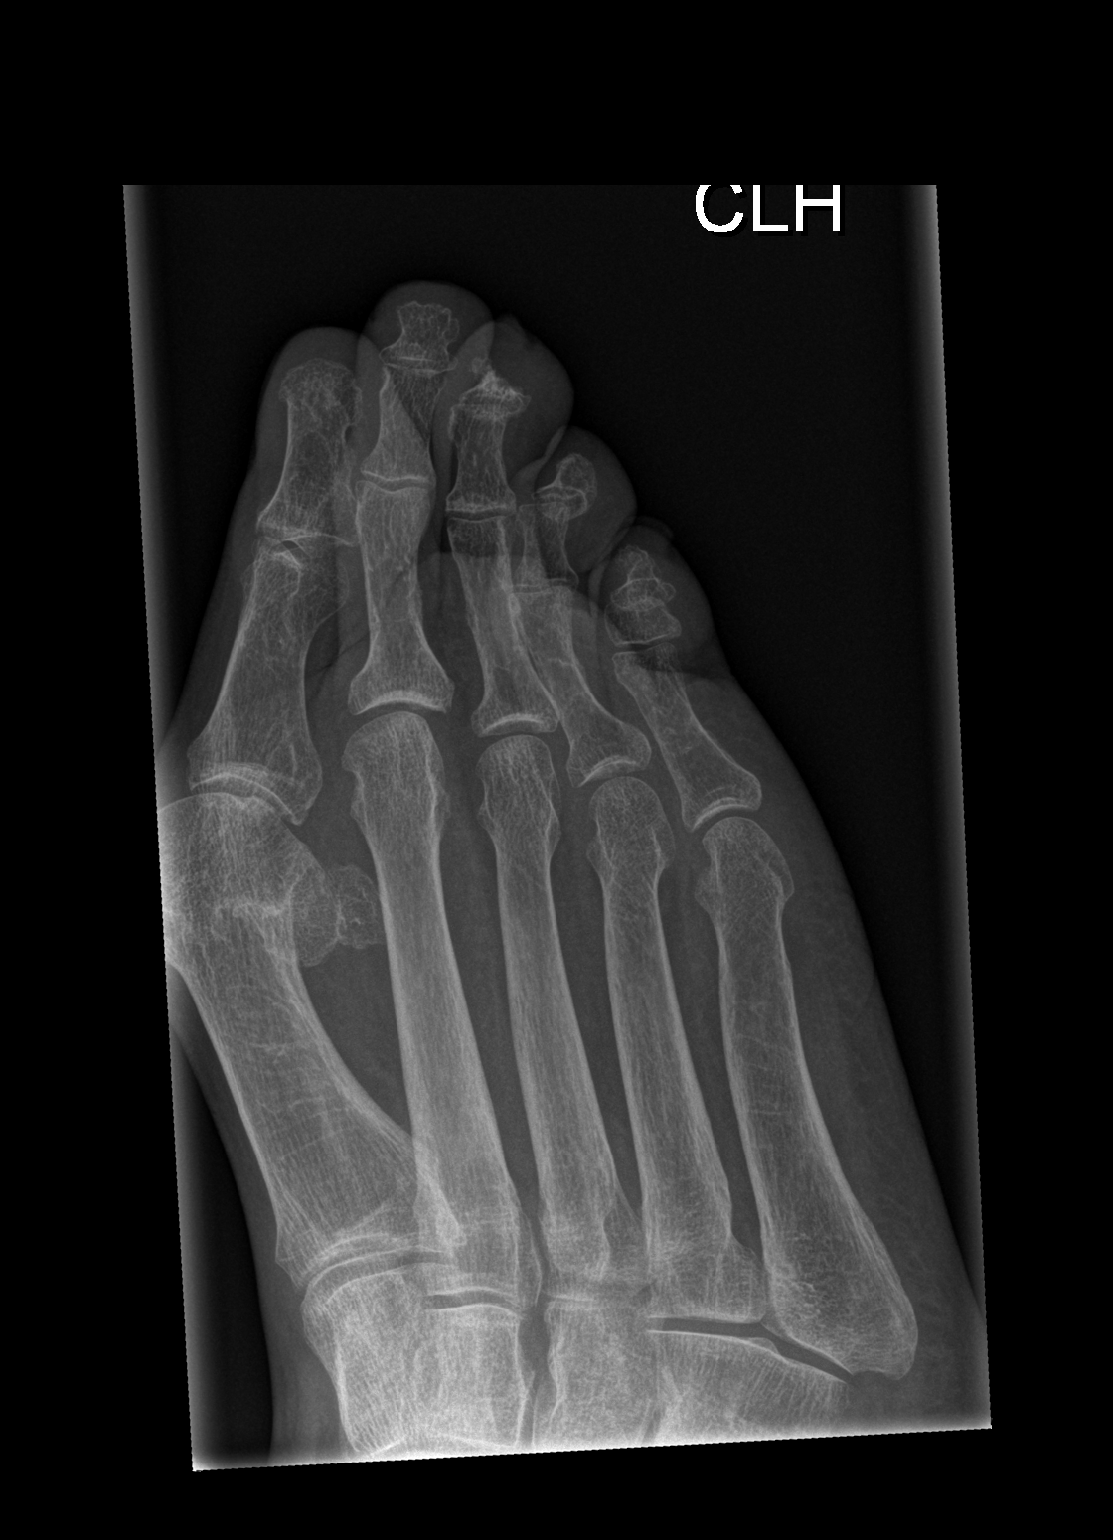

[x toes lat right]
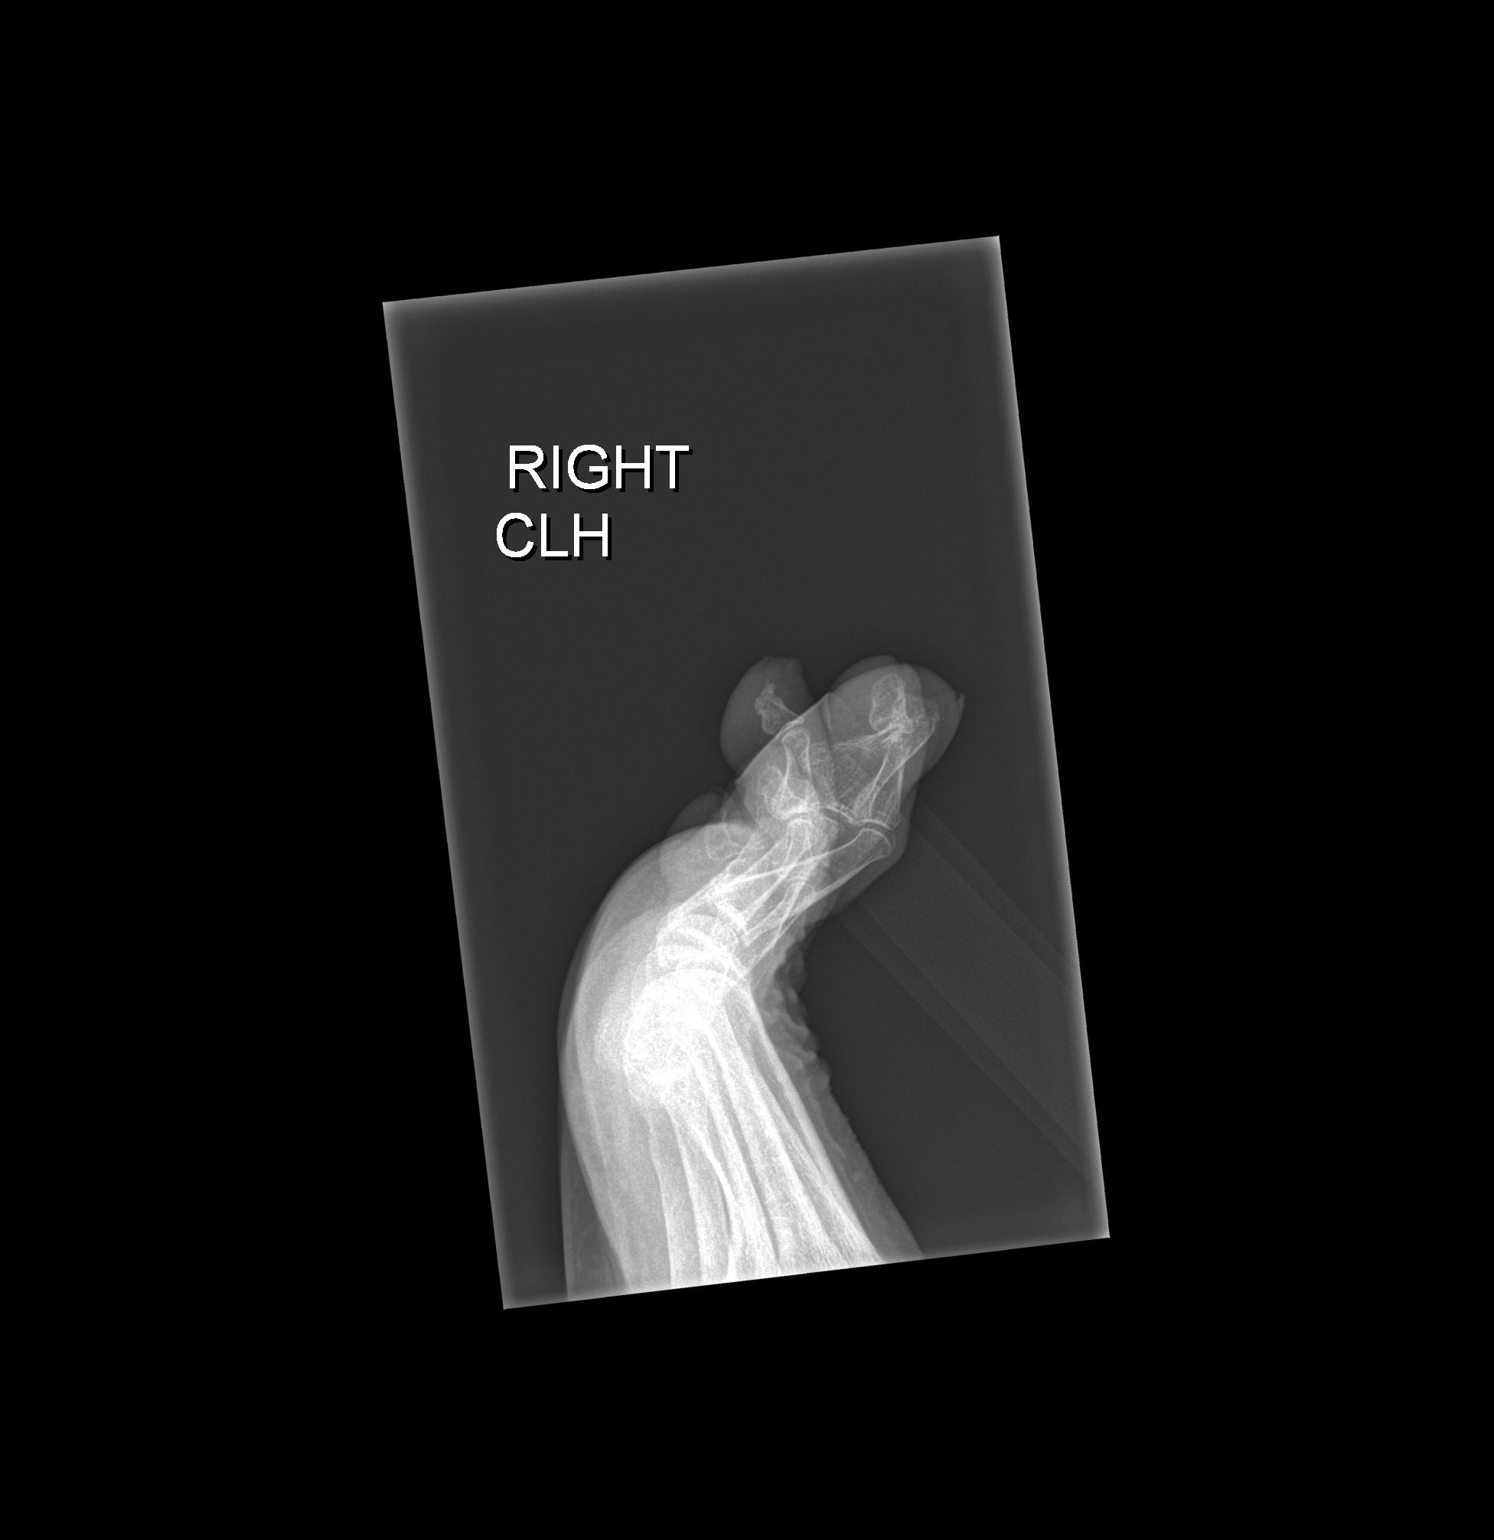

[3 of 3 positions shown; findings below may reference images not displayed]

FINDINGS: There is mild soft tissue swelling of the distal phalanx of the
third toe. No soft tissue gas or foreign body is observed. There is
resorptive change of the shaft and tuft of the distal phalanx.
IMPRESSION: The findings are suspicious for osteolysis of the shaft and tuft of
the third toe. This may reflect low chronic osteomyelitis. There is
mild soft tissue swelling without soft tissue gas or foreign bodies.

## 2018-01-20 ENCOUNTER — Other Ambulatory Visit: Payer: Self-pay | Admitting: Student in an Organized Health Care Education/Training Program

## 2018-03-02 ENCOUNTER — Other Ambulatory Visit: Payer: Self-pay

## 2018-03-02 ENCOUNTER — Ambulatory Visit (INDEPENDENT_AMBULATORY_CARE_PROVIDER_SITE_OTHER): Payer: Medicare HMO | Admitting: Internal Medicine

## 2018-03-02 VITALS — BP 123/74 | HR 60 | Temp 97.5°F | Ht 72.0 in | Wt 189.1 lb

## 2018-03-02 DIAGNOSIS — F1010 Alcohol abuse, uncomplicated: Secondary | ICD-10-CM

## 2018-03-02 DIAGNOSIS — I10 Essential (primary) hypertension: Secondary | ICD-10-CM

## 2018-03-02 DIAGNOSIS — M1612 Unilateral primary osteoarthritis, left hip: Secondary | ICD-10-CM | POA: Diagnosis not present

## 2018-03-02 DIAGNOSIS — I714 Abdominal aortic aneurysm, without rupture: Secondary | ICD-10-CM | POA: Diagnosis not present

## 2018-03-02 DIAGNOSIS — Z0000 Encounter for general adult medical examination without abnormal findings: Secondary | ICD-10-CM

## 2018-03-02 DIAGNOSIS — Z79891 Long term (current) use of opiate analgesic: Secondary | ICD-10-CM

## 2018-03-02 DIAGNOSIS — Z23 Encounter for immunization: Secondary | ICD-10-CM

## 2018-03-02 DIAGNOSIS — M25551 Pain in right hip: Secondary | ICD-10-CM

## 2018-03-02 DIAGNOSIS — I491 Atrial premature depolarization: Secondary | ICD-10-CM | POA: Diagnosis not present

## 2018-03-02 DIAGNOSIS — G8929 Other chronic pain: Secondary | ICD-10-CM | POA: Diagnosis not present

## 2018-03-02 DIAGNOSIS — I2510 Atherosclerotic heart disease of native coronary artery without angina pectoris: Secondary | ICD-10-CM

## 2018-03-02 DIAGNOSIS — M25552 Pain in left hip: Secondary | ICD-10-CM

## 2018-03-02 MED ORDER — HYDROCODONE-ACETAMINOPHEN 7.5-325 MG PO TABS: 1.0000 | ORAL_TABLET | Freq: Three times a day (TID) | ORAL | 0 refills | Status: DC | PRN

## 2018-03-02 NOTE — Progress Notes (Signed)
   CC: left hip pain  HPI:  [name] is a 72 y.o. with a PMH of AAA, CAD, HTN, PAC, left hip OA on chronic opioid therapy presenting to clinic for follow up on chronic left hip pain.   Patient last seen in June 2019 for his left hip pain; at that time due to adequate pain control and having left over pills at end of each month his monthly count of hydrocodone-apap 7.5-325mg  was decreased from 120 to 90. Patient states he continues to do well; takes 3 pills per day usually and feels 90 pills per month is enough to control his pain. He denies worsening pain, radicular pain, numbness/tingling, urinary or bowel incontinence and has not had any falls or other trauma since last visit.  Please see problem based Assessment and Plan for status of patients chronic conditions.  Past Medical History:  Diagnosis Date  . AAA (abdominal aortic aneurysm) (H[facility])    noted CT 11/2012 size 3.3 x 3.2 cm  . Alcohol abuse   . Barretts esophagus    bx distal esophagus 2011 neg   . Cataract   . Cataract   . CHF (congestive heart failure) ([place])   . Cholelithiasis   . 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   2006 - 3 adenomas largest 12 mm  2008 - no polyps 2013-  2 mm cecal polyp, adenoma, routine repeat colonoscopy 02/2017 approx 05/14/2017 18 polyps max 12 mm ALL adenomas recall 2019 -  [name], MD, [facility]     . Diverticulosis of colon   . DJD (degenerative joint disease)    left hip  . Full dentures    upper and lower  . GERD (gastroesophageal reflux disease)    diet controlled   . Gout    feet  . Hiatal hernia    2 cm noted on upper endos 2011   . Hx of adenomatous colonic polyps   . Hyperlipidemia   . Hypertension   . Internal hemorrhoid   . 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 Myocardial infarction [facility]) 2004   hx of, 2 stents 2004. pt was on Plavix x 3 years then transitioned to ASA, no problems since  . Non-small cell carcinoma  of right lung, stage 1 ([place]) 01/30/2016  . Renal failure, acute (HCC)    hx of 2/2 medication, resolved  . Tobacco abuse    smoker .5 ppd x 55 yrs  . UTI (lower urinary tract infection)     Review of Systems:   Per HPI  Physical Exam:  Vitals:   03/02/18 0915  BP: 123/74  Pulse: 60  Temp: (!) 97.5 F (36.4 C)  TempSrc: Oral  SpO2: 95%  Weight: 189 lb 1.6 oz (85.8 kg)  Height: 6' (1.829 m)   GENERAL- alert, co-operative, appears as stated age, not in any distress. CARDIAC- RRR, no murmurs, rubs or gallops. RESP- Moving equal volumes of air, and clear to auscultation bilaterally, no wheezes or crackles. ABDOMEN- Soft, nontender, bowel sounds present. EXTREMITIES- pulse 2+ PT, symmetric, no pedal edema. No TTP of spine, SI joints or paraspinal mm. Strength and sensation intact in bil LEs.  PSYCH- Normal mood and affect, appropriate thought content and speech.  Assessment & Plan:   See Encounters Tab for problem based charting.   Patient discussed with Dr. Abelardo Diesel, MD Internal Medicine PGY-3

## 2018-03-02 NOTE — Assessment & Plan Note (Signed)
[name] is on chronic opioid therapy for chronic pain. The date of the controlled substances contract is referenced in the [place] and / or the overview. Date of pain contract was 08/2016. As part of the treatment plan, the [place] controlled substance database is checked at least twice yearly and the database results are appropriate. I have last reviewed the results on 03/02/2018.   The last UDS was on 11/24/17 and results are as expected. Patient needs at least a yearly UDS.   The patient is on hydrocodone/acetaminophen ([place], Lortab, Norco, Vicodin) strength 7.5-325mg , 90 per 30 days. This regimen allows [name] to function and does not cause excessive sedation or other side effects.  "The benefits of continuing opioid therapy outweigh the risks and chronic opioids will be continued. Ongoing education about safe opioid treatment is provided  Interventions today include: 1x refill until he can follow up with PCP

## 2018-03-02 NOTE — Assessment & Plan Note (Signed)
Flu shot administered today.

## 2018-03-02 NOTE — Assessment & Plan Note (Signed)
U tox at last visit with + EtOH. We discussed his alcohol use which he states is heavier during football season; currently he drinks about 3 times a week and will share a 5th of gin with two friends each night they drink. We discussed briefly decreasing amount of alcohol intake in general and specifically since he is on opioid therapy which can increase his risk for falls, accidents and overall morbidity.

## 2018-03-02 NOTE — Patient Instructions (Signed)
I have refilled your pain medicine for 1 month. Please make an appointment with Dr. Evette Doffing for a regular follow up within the next few weeks.

## 2018-03-04 NOTE — Progress Notes (Signed)
Internal Medicine Clinic Attending  Case discussed with Dr. Svalina  at the time of the visit.  We reviewed the resident's history and exam and pertinent patient test results.  I agree with the assessment, diagnosis, and plan of care documented in the resident's note.  

## 2018-04-02 ENCOUNTER — Encounter (HOSPITAL_COMMUNITY): Payer: Self-pay | Admitting: Emergency Medicine

## 2018-04-02 ENCOUNTER — Other Ambulatory Visit: Payer: Self-pay

## 2018-04-02 ENCOUNTER — Encounter: Payer: Self-pay | Admitting: Student in an Organized Health Care Education/Training Program

## 2018-04-02 ENCOUNTER — Emergency Department (HOSPITAL_COMMUNITY)
Admission: EM | Admit: 2018-04-02 | Discharge: 2018-04-02 | Disposition: A | Discharge status: Home / Self Care | Payer: Medicare HMO | Attending: Emergency Medicine | Admitting: Emergency Medicine

## 2018-04-02 ENCOUNTER — Emergency Department (HOSPITAL_COMMUNITY): Payer: Medicare HMO

## 2018-04-02 DIAGNOSIS — Z79899 Other long term (current) drug therapy: Secondary | ICD-10-CM | POA: Insufficient documentation

## 2018-04-02 DIAGNOSIS — Z955 Presence of coronary angioplasty implant and graft: Secondary | ICD-10-CM | POA: Insufficient documentation

## 2018-04-02 DIAGNOSIS — R1084 Generalized abdominal pain: Secondary | ICD-10-CM | POA: Diagnosis not present

## 2018-04-02 DIAGNOSIS — I2510 Atherosclerotic heart disease of native coronary artery without angina pectoris: Secondary | ICD-10-CM | POA: Diagnosis not present

## 2018-04-02 DIAGNOSIS — K8020 Calculus of gallbladder without cholecystitis without obstruction: Secondary | ICD-10-CM | POA: Diagnosis not present

## 2018-04-02 DIAGNOSIS — I10 Essential (primary) hypertension: Secondary | ICD-10-CM | POA: Diagnosis not present

## 2018-04-02 DIAGNOSIS — N400 Enlarged prostate without lower urinary tract symptoms: Secondary | ICD-10-CM | POA: Insufficient documentation

## 2018-04-02 DIAGNOSIS — I110 Hypertensive heart disease with heart failure: Secondary | ICD-10-CM | POA: Insufficient documentation

## 2018-04-02 DIAGNOSIS — R339 Retention of urine, unspecified: Secondary | ICD-10-CM | POA: Insufficient documentation

## 2018-04-02 DIAGNOSIS — F17210 Nicotine dependence, cigarettes, uncomplicated: Secondary | ICD-10-CM | POA: Insufficient documentation

## 2018-04-02 DIAGNOSIS — Z7982 Long term (current) use of aspirin: Secondary | ICD-10-CM | POA: Diagnosis not present

## 2018-04-02 DIAGNOSIS — N320 Bladder-neck obstruction: Secondary | ICD-10-CM

## 2018-04-02 DIAGNOSIS — K5730 Diverticulosis of large intestine without perforation or abscess without bleeding: Secondary | ICD-10-CM | POA: Diagnosis not present

## 2018-04-02 DIAGNOSIS — I509 Heart failure, unspecified: Secondary | ICD-10-CM | POA: Insufficient documentation

## 2018-04-02 DIAGNOSIS — R52 Pain, unspecified: Secondary | ICD-10-CM | POA: Diagnosis not present

## 2018-04-02 DIAGNOSIS — N390 Urinary tract infection, site not specified: Secondary | ICD-10-CM | POA: Diagnosis not present

## 2018-04-02 HISTORY — DX: Bladder-neck obstruction: N32.0

## 2018-04-02 LAB — COMPREHENSIVE METABOLIC PANEL
ALT: 22 U/L (ref 0–44)
ANION GAP: 10 (ref 5–15)
AST: 38 U/L (ref 15–41)
Albumin: 3.8 g/dL (ref 3.5–5.0)
Alkaline Phosphatase: 65 U/L (ref 38–126)
BUN: 9 mg/dL (ref 8–23)
CHLORIDE: 101 mmol/L (ref 98–111)
CO2: 26 mmol/L (ref 22–32)
Calcium: 9.6 mg/dL (ref 8.9–10.3)
Creatinine, Ser: 1.04 mg/dL (ref 0.61–1.24)
GFR calc non Af Amer: >60 mL/min (ref >60)
Glucose, Bld: 107 mg/dL — ABNORMAL HIGH (ref 70–99)
POTASSIUM: 3 mmol/L — AB (ref 3.5–5.1)
SODIUM: 137 mmol/L (ref 135–145)
Total Bilirubin: 1.2 mg/dL (ref 0.3–1.2)
Total Protein: 7.8 g/dL (ref 6.5–8.1)

## 2018-04-02 LAB — CBC WITH DIFFERENTIAL/PLATELET
ABS IMMATURE GRANULOCYTES: 0.02 10*3/uL (ref 0.00–0.07)
BASOS PCT: 0 %
Basophils Absolute: 0 10*3/uL (ref 0.0–0.1)
Eosinophils Absolute: 0.1 10*3/uL (ref 0.0–0.5)
Eosinophils Relative: 1 %
HCT: 44.5 % (ref 39.0–52.0)
Hemoglobin: 14.1 g/dL (ref 13.0–17.0)
Immature Granulocytes: 0 %
Lymphocytes Relative: 16 %
Lymphs Abs: 1.3 10*3/uL (ref 0.7–4.0)
MCH: 31.7 pg (ref 26.0–34.0)
MCHC: 31.7 g/dL (ref 30.0–36.0)
MCV: 100 fL (ref 80.0–100.0)
MONO ABS: 0.7 10*3/uL (ref 0.1–1.0)
Monocytes Relative: 8 %
NEUTROS PCT: 75 %
Neutro Abs: 5.9 10*3/uL (ref 1.7–7.7)
PLATELETS: 112 10*3/uL — AB (ref 150–400)
RBC: 4.45 MIL/uL (ref 4.22–5.81)
RDW: 14 % (ref 11.5–15.5)
WBC: 8 10*3/uL (ref 4.0–10.5)
nRBC: 0 % (ref 0.0–0.2)

## 2018-04-02 LAB — URINALYSIS, ROUTINE W REFLEX MICROSCOPIC
Bilirubin Urine: NEGATIVE
GLUCOSE, UA: NEGATIVE mg/dL
Ketones, ur: NEGATIVE mg/dL
NITRITE: POSITIVE — AB
PH: 6 (ref 5.0–8.0)
Protein, ur: NEGATIVE mg/dL
Specific Gravity, Urine: 1.011 (ref 1.005–1.030)
WBC, UA: >50 WBC/hpf — ABNORMAL HIGH (ref 0–5)

## 2018-04-02 LAB — LIPASE, BLOOD: Lipase: 31 U/L (ref 11–51)

## 2018-04-02 MED ORDER — CEPHALEXIN 500 MG PO CAPS: 500.0000 mg | ORAL_CAPSULE | Freq: Four times a day (QID) | ORAL | 0 refills | Status: DC

## 2018-04-02 MED ORDER — SODIUM CHLORIDE 0.9 % IV SOLN
1.0000 g | Freq: Once | INTRAVENOUS | Status: AC
  Administered 2018-04-02: 1 g via INTRAVENOUS
  Filled 2018-04-02: qty 10

## 2018-04-02 MED ORDER — TAMSULOSIN HCL 0.4 MG PO CAPS: 0.4000 mg | ORAL_CAPSULE | Freq: Every day | ORAL | 0 refills | Status: DC

## 2018-04-02 NOTE — ED Triage Notes (Addendum)
Pt BIB [facility] for eval of lower abd pain, loss of appetite, constipation x 2 days. Pt reports he is on chronic PO vicodin TID. Pt also has hx of ETOH abuse, denies hx pancreatitis.

## 2018-04-02 NOTE — ED Provider Notes (Signed)
[name] Provider Note   CSN: [id number] Arrival date & time: 04/02/18  0348     History   Chief Complaint Chief Complaint  Patient presents with  . Abdominal Pain    HPI [name] is a 72 y.o. male.  The history is provided by the patient. No language interpreter was used.  Abdominal Pain   This is a new problem. The current episode started more than 2 days ago. The problem occurs constantly. The problem has not changed since onset.The pain is associated with an unknown factor. The pain is located in the generalized abdominal region. The quality of the pain is aching. The pain is moderate. Associated symptoms include constipation and frequency. Pertinent negatives include fever and flatus. Associated symptoms comments: dribbling. Nothing aggravates the symptoms. Nothing relieves the symptoms. Past workup does not include barium enema. His past medical history does not include PUD.    Past Medical History:  Diagnosis Date  . AAA (abdominal aortic aneurysm) (HCC)    noted CT 11/2012 size 3.3 x 3.2 cm  . Alcohol abuse   . Barretts esophagus    bx distal esophagus 2011 neg   . Cataract   . Cataract   . CHF (congestive heart failure) ([place])   . Cholelithiasis   . 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   2006 - 3 adenomas largest 12 mm  2008 - no polyps 2013-  2 mm cecal polyp, adenoma, routine repeat colonoscopy 02/2017 approx 05/14/2017 18 polyps max 12 mm ALL adenomas recall 2019 -  [name], MD, [facility]     . Diverticulosis of colon   . DJD (degenerative joint disease)    left hip  . Full dentures    upper and lower  . GERD (gastroesophageal reflux disease)    diet controlled   . Gout    feet  . Hiatal hernia    2 cm noted on upper endos 2011   . Hx of adenomatous colonic polyps   . Hyperlipidemia   . Hypertension   . Internal hemorrhoid   . 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 Myocardial infarction [facility]) 2004   hx of, 2 stents 2004. pt was on Plavix x 3 years then transitioned to ASA, no problems since  . Non-small cell carcinoma of right lung, stage 1 ([place]) 01/30/2016  . Renal failure, acute (HCC)    hx of 2/2 medication, resolved  . Tobacco abuse    smoker .5 ppd x 55 yrs  . UTI (lower urinary tract infection)     Patient Active Problem List   Diagnosis Date Noted  . Mild alcohol use disorder 12/09/2016  . Premature atrial contractions 07/31/2015  . Chronic use of opiate for therapeutic purpose 05/01/2015  . Atherosclerosis of aorta ([place]) 01/13/2015  . Prediabetes 12/30/2014  . AAA (abdominal aortic aneurysm) ([place])   . Healthcare maintenance 06/30/2012  . Coronary artery disease of native artery of native heart with stable angina pectoris ([place]) 03/17/2012  . Erectile dysfunction 10/12/2010  . Arthropathy of pelvic region and thigh 05/02/2008  . 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  . Gout 04/18/2006  . Tobacco use disorder 04/18/2006  . Essential hypertension 04/18/2006  . GERD 04/18/2006    Past Surgical History:  Procedure Laterality Date  . cataract surgery Bilateral 2018   removal of cataracts  . COLONOSCOPY     hx polyps - [name] 02/2012  .  CORONARY ANGIOPLASTY WITH STENT PLACEMENT    . flexible cystourethroscopy  05/24/06  . INGUINAL HERNIA REPAIR  2005  . LUNG LOBECTOMY Right 11/07   RUL  . MULTIPLE TOOTH EXTRACTIONS    . UPPER GASTROINTESTINAL ENDOSCOPY          Home Medications    Prior to Admission medications   Medication Sig Start Date End Date Taking? Authorizing Provider  allopurinol (ZYLOPRIM) 100 MG tablet TAKE 1 TABLET EVERY DAY Patient taking differently: Take 100 mg by mouth daily.  04/24/17  Yes Axel Filler, MD  amLODipine (NORVASC) 10 MG tablet TAKE 1 TABLET EVERY DAY Patient taking differently: Take 10 mg by mouth daily.  01/21/18  Yes Axel Filler, MD  aspirin EC 81 MG tablet  Take 81 mg by mouth daily.   Yes [provider]  HYDROcodone-acetaminophen (NORCO) 7.5-325 MG tablet Take 1 tablet by mouth every 8 (eight) hours as needed for moderate pain. 03/02/18  Yes Alphonzo Grieve, MD  metoprolol tartrate (LOPRESSOR) 50 MG tablet TAKE 1 TABLET TWICE DAILY Patient taking differently: Take 50 mg by mouth 2 (two) times daily.  10/20/17  Yes Aldine Contes, MD  Multiple Vitamin (MULTIVITAMIN) tablet Take 1 tablet by mouth daily.   Yes [provider]  rosuvastatin (CRESTOR) 20 MG tablet TAKE 1 TABLET EVERY DAY Patient taking differently: Take 20 mg by mouth daily.  05/19/17  Yes Axel Filler, MD  albuterol (PROVENTIL HFA;VENTOLIN HFA) 108 (90 Base) MCG/ACT inhaler Inhale 2 puffs into the lungs every 6 (six) hours as needed for wheezing or shortness of breath. Patient not taking: Reported on 04/02/2018 06/02/17   Axel Filler, MD  cephALEXin (KEFLEX) 500 MG capsule Take 1 capsule (500 mg total) by mouth 4 (four) times daily. 04/02/18   Jasiah Elsen, MD  tamsulosin (FLOMAX) 0.4 MG CAPS capsule Take 1 capsule (0.4 mg total) by mouth daily. 04/02/18   Madalen Gavin, MD    Family History Family History  Problem Relation Age of Onset  . Diabetes Mother   . Hypertension Sister   . Heart disease Sister   . Diabetes Sister   . Arthritis Brother   . Gout Brother   . Colon cancer Neg Hx   . Esophageal cancer Neg Hx   . Rectal cancer Neg Hx   . Stomach cancer Neg Hx   . Colon polyps Neg Hx     Social History Social History   Tobacco Use  . Smoking status: Current Every Day Smoker    Packs/day: 0.50    Years: 55.00    Pack years: 27.50    Types: Cigarettes  . Smokeless tobacco: Never Used  Substance Use Topics  . Alcohol use: Yes    Alcohol/week: 0.0 standard drinks    Comment: drinking 1-2 pts gin/week, (mixed with tap water not tonic)  . Drug use: No     Allergies   Candesartan cilexetil-hctz; Hydrochlorothiazide; and  Shellfish allergy   Review of Systems Review of Systems  Constitutional: Negative for fever.  Respiratory: Negative for shortness of breath.   Cardiovascular: Negative for chest pain.  Gastrointestinal: Positive for abdominal pain and constipation. Negative for flatus.  Genitourinary: Positive for decreased urine volume, difficulty urinating, frequency and urgency.  All other systems reviewed and are negative.    Physical Exam Updated Vital Signs BP (!) 166/98   Pulse 84   Temp 98.1 F (36.7 C) (Oral)   Resp 18   Ht 6' (1.829 m)  Wt 85.7 kg   SpO2 95%   BMI 25.63 kg/m   Physical Exam  Constitutional: He is oriented to person, place, and time. He appears well-developed and well-nourished. No distress.  HENT:  Head: Normocephalic and atraumatic.  Nose: Nose normal.  Eyes: Pupils are equal, round, and reactive to light.  Neck: Normal range of motion. Neck supple.  Cardiovascular: Normal rate, regular rhythm, normal heart sounds and intact distal pulses.  Pulmonary/Chest: Effort normal and breath sounds normal. No stridor. No respiratory distress.  Abdominal: Soft. Bowel sounds are normal. He exhibits distension. He exhibits no mass. There is no rebound and no guarding. No hernia.  Musculoskeletal: Normal range of motion.  Neurological: He is alert and oriented to person, place, and time.  Skin: Skin is warm and dry. Capillary refill takes less than 2 seconds.  Psychiatric: He has a normal mood and affect.     ED Treatments / Results  Labs (all labs ordered are listed, but only abnormal results are displayed) Results for orders placed or performed during the hospital encounter of 04/02/18  Urinalysis, Routine w reflex microscopic  Result Value Ref Range   Color, Urine YELLOW YELLOW   APPearance CLOUDY (A) CLEAR   Specific Gravity, Urine 1.011 1.005 - 1.030   pH 6.0 5.0 - 8.0   Glucose, UA NEGATIVE NEGATIVE mg/dL   Hgb urine dipstick SMALL (A) NEGATIVE   Bilirubin  Urine NEGATIVE NEGATIVE   Ketones, ur NEGATIVE NEGATIVE mg/dL   Protein, ur NEGATIVE NEGATIVE mg/dL   Nitrite POSITIVE (A) NEGATIVE   Leukocytes, UA SMALL (A) NEGATIVE   RBC / HPF 0-5 0 - 5 RBC/hpf   WBC, UA >50 (H) 0 - 5 WBC/hpf   Bacteria, UA MANY (A) NONE SEEN   Squamous Epithelial / LPF 6-10 0 - 5   Mucus PRESENT    Hyaline Casts, UA PRESENT   CBC with Differential  Result Value Ref Range   WBC 8.0 4.0 - 10.5 K/uL   RBC 4.45 4.22 - 5.81 MIL/uL   Hemoglobin 14.1 13.0 - 17.0 g/dL   HCT 44.5 39.0 - 52.0 %   MCV 100.0 80.0 - 100.0 fL   MCH 31.7 26.0 - 34.0 pg   MCHC 31.7 30.0 - 36.0 g/dL   RDW 14.0 11.5 - 15.5 %   Platelets 112 (L) 150 - 400 K/uL   nRBC 0.0 0.0 - 0.2 %   Neutrophils Relative % 75 %   Neutro Abs 5.9 1.7 - 7.7 K/uL   Lymphocytes Relative 16 %   Lymphs Abs 1.3 0.7 - 4.0 K/uL   Monocytes Relative 8 %   Monocytes Absolute 0.7 0.1 - 1.0 K/uL   Eosinophils Relative 1 %   Eosinophils Absolute 0.1 0.0 - 0.5 K/uL   Basophils Relative 0 %   Basophils Absolute 0.0 0.0 - 0.1 K/uL   Immature Granulocytes 0 %   Abs Immature Granulocytes 0.02 0.00 - 0.07 K/uL  Comprehensive metabolic panel  Result Value Ref Range   Sodium 137 135 - 145 mmol/L   Potassium 3.0 (L) 3.5 - 5.1 mmol/L   Chloride 101 98 - 111 mmol/L   CO2 26 22 - 32 mmol/L   Glucose, Bld 107 (H) 70 - 99 mg/dL   BUN 9 8 - 23 mg/dL   Creatinine, Ser 1.04 0.61 - 1.24 mg/dL   Calcium 9.6 8.9 - 10.3 mg/dL   Total Protein 7.8 6.5 - 8.1 g/dL   Albumin 3.8 3.5 - 5.0 g/dL  AST 38 15 - 41 U/L   ALT 22 0 - 44 U/L   Alkaline Phosphatase 65 38 - 126 U/L   Total Bilirubin 1.2 0.3 - 1.2 mg/dL   GFR calc non Af Amer >60 >60 mL/min   GFR calc Af Amer >60 >60 mL/min   Anion gap 10 5 - 15  Lipase, blood  Result Value Ref Range   Lipase 31 11 - 51 U/L   Ct Renal Stone Study  Result Date: 04/02/2018 CLINICAL DATA:  72 y/o M; lower abdominal pain, loss of appetite, constipation, and dysuria for 2 days. EXAM: CT  ABDOMEN AND PELVIS WITHOUT CONTRAST TECHNIQUE: Multidetector CT imaging of the abdomen and pelvis was performed following the standard protocol without IV contrast. COMPARISON:  03/09/2015 CT abdomen and pelvis. 09/19/2017 abdominal ultrasound FINDINGS: Lower chest: Chronic right thoracotomy defect of the lower ribs. Severe coronary artery calcific atherosclerosis. Hepatobiliary: No focal liver lesion. Cholelithiasis. No gallbladder wall thickening or biliary ductal dilatation. Pancreas: Faint calcifications in the pancreatic head may reflect sequelae of prior pancreatitis. No pancreatic ductal dilatation or surrounding inflammatory changes. Spleen: Normal in size without focal abnormality. Adrenals/Urinary Tract: Normal adrenal glands. Four stable simple cysts in the right kidney, the largest decreased in size measuring 3.6 cm, previously 4.2 cm. Again seen are isodense exophytic lesions in the upper pole of the left kidney which are stable from prior studies and compatible with hemorrhagic cyst. No hydronephrosis or urinary stone disease. Distended bladder 9.5 x 11.5 x 17.8 cm (volume = 1000 cm^3). Stomach/Bowel: Stomach is within normal limits. Appendix appears normal. No evidence of bowel wall thickening, distention, or inflammatory changes. Mild colonic diverticulosis. Vascular/Lymphatic: Infrarenal abdominal aortic aneurysm measures 3.8 x 3.9 Cm in the axial plane (series 3, image 52). Which is increased in size from prior studies. Reproductive: Severe prostate enlargement a lobulated convex margin extending into the floor of bladder. Other: No abdominal wall hernia or abnormality. No abdominopelvic ascites. Musculoskeletal: Transitional L5 vertebral body with partial sacralization. Multilevel disc and facet degenerative changes of the lumbar spine. No acute osseous abnormality is evident. IMPRESSION: 1. Distended bladder, 1000 cc. Severe prostate enlargement. 2. 3.9 cm infrarenal abdominal aortic aneurysm,  stable from prior ultrasound given differences in technique. Follow-up as per ultrasound recommendations. 3. Cholelithiasis. 4. Mild colonic diverticulosis without evidence for diverticulitis. 5. Aortic and coronary artery calcific atherosclerosis. Electronically Signed   By: Kristine Garbe M.D.   On: 04/02/2018 04:34    Radiology Ct Renal Stone Study  Result Date: 04/02/2018 CLINICAL DATA:  72 y/o M; lower abdominal pain, loss of appetite, constipation, and dysuria for 2 days. EXAM: CT ABDOMEN AND PELVIS WITHOUT CONTRAST TECHNIQUE: Multidetector CT imaging of the abdomen and pelvis was performed following the standard protocol without IV contrast. COMPARISON:  03/09/2015 CT abdomen and pelvis. 09/19/2017 abdominal ultrasound FINDINGS: Lower chest: Chronic right thoracotomy defect of the lower ribs. Severe coronary artery calcific atherosclerosis. Hepatobiliary: No focal liver lesion. Cholelithiasis. No gallbladder wall thickening or biliary ductal dilatation. Pancreas: Faint calcifications in the pancreatic head may reflect sequelae of prior pancreatitis. No pancreatic ductal dilatation or surrounding inflammatory changes. Spleen: Normal in size without focal abnormality. Adrenals/Urinary Tract: Normal adrenal glands. Four stable simple cysts in the right kidney, the largest decreased in size measuring 3.6 cm, previously 4.2 cm. Again seen are isodense exophytic lesions in the upper pole of the left kidney which are stable from prior studies and compatible with hemorrhagic cyst. No hydronephrosis or urinary stone disease. Distended  bladder 9.5 x 11.5 x 17.8 cm (volume = 1000 cm^3). Stomach/Bowel: Stomach is within normal limits. Appendix appears normal. No evidence of bowel wall thickening, distention, or inflammatory changes. Mild colonic diverticulosis. Vascular/Lymphatic: Infrarenal abdominal aortic aneurysm measures 3.8 x 3.9 Cm in the axial plane (series 3, image 52). Which is increased in  size from prior studies. Reproductive: Severe prostate enlargement a lobulated convex margin extending into the floor of bladder. Other: No abdominal wall hernia or abnormality. No abdominopelvic ascites. Musculoskeletal: Transitional L5 vertebral body with partial sacralization. Multilevel disc and facet degenerative changes of the lumbar spine. No acute osseous abnormality is evident. IMPRESSION: 1. Distended bladder, 1000 cc. Severe prostate enlargement. 2. 3.9 cm infrarenal abdominal aortic aneurysm, stable from prior ultrasound given differences in technique. Follow-up as per ultrasound recommendations. 3. Cholelithiasis. 4. Mild colonic diverticulosis without evidence for diverticulitis. 5. Aortic and coronary artery calcific atherosclerosis. Electronically Signed   By: Kristine Garbe M.D.   On: 04/02/2018 04:34    Procedures Procedures (including critical care time)  Medications Ordered in ED Medications  cefTRIAXone (ROCEPHIN) 1 g in sodium chloride 0.9 % 100 mL IVPB (1 g Intravenous New Bag/Given 04/02/18 0451)     Foley catheter placed, leg bag and education provided.  Follow up with urology in 7 days for voiding trial.    Final Clinical Impressions(s) / ED Diagnoses   Final diagnoses:  Lower urinary tract infectious disease  Urinary retention   Return for weakness, numbness, changes in vision or speech, fevers >100.4 unrelieved by medication, shortness of breath, intractable vomiting, or diarrhea, abdominal pain, Inability to tolerate liquids or food, cough, altered mental status or any concerns. No signs of systemic illness or infection. The patient is nontoxic-appearing on exam and vital signs are within normal limits.    I have reviewed the triage vital signs and the nursing notes. Pertinent labs &imaging results that were available during my care of the patient were reviewed by me and considered in my medical decision making (see chart for details).  After  history, exam, and medical workup I feel the patient has been appropriately medically screened and is safe for discharge home. Pertinent diagnoses were discussed with the patient. Patient was given return precautions.    ED Discharge Orders         Ordered    tamsulosin (FLOMAX) 0.4 MG CAPS capsule  Daily     04/02/18 0459    cephALEXin (KEFLEX) 500 MG capsule  4 times daily     04/02/18 0459    Urine Culture     04/02/18 0459           Davaun Quintela, MD 04/02/18 [id number]

## 2018-04-02 NOTE — ED Notes (Signed)
Patient verbalizes understanding of discharge instructions. Opportunity for questioning and answers were provided. Armband removed by staff, pt discharged from ED ambulatory, Leg bag instructions reviewed and understood. Pt understands f/u w/ urology

## 2018-04-03 LAB — URINE CULTURE

## 2018-04-06 ENCOUNTER — Encounter: Payer: Self-pay | Admitting: Student in an Organized Health Care Education/Training Program

## 2018-04-06 ENCOUNTER — Ambulatory Visit (INDEPENDENT_AMBULATORY_CARE_PROVIDER_SITE_OTHER): Payer: Medicare HMO | Admitting: Student in an Organized Health Care Education/Training Program

## 2018-04-06 ENCOUNTER — Other Ambulatory Visit: Payer: Self-pay

## 2018-04-06 VITALS — BP 132/70 | HR 68 | Temp 98.0°F | Wt 190.2 lb

## 2018-04-06 DIAGNOSIS — G8929 Other chronic pain: Secondary | ICD-10-CM

## 2018-04-06 DIAGNOSIS — Z87440 Personal history of urinary (tract) infections: Secondary | ICD-10-CM | POA: Diagnosis not present

## 2018-04-06 DIAGNOSIS — Z960 Presence of urogenital implants: Secondary | ICD-10-CM

## 2018-04-06 DIAGNOSIS — M25559 Pain in unspecified hip: Secondary | ICD-10-CM

## 2018-04-06 DIAGNOSIS — Z79899 Other long term (current) drug therapy: Secondary | ICD-10-CM

## 2018-04-06 DIAGNOSIS — Z87448 Personal history of other diseases of urinary system: Secondary | ICD-10-CM

## 2018-04-06 DIAGNOSIS — M545 Low back pain: Secondary | ICD-10-CM

## 2018-04-06 DIAGNOSIS — M25552 Pain in left hip: Secondary | ICD-10-CM

## 2018-04-06 DIAGNOSIS — Z7982 Long term (current) use of aspirin: Secondary | ICD-10-CM

## 2018-04-06 DIAGNOSIS — N320 Bladder-neck obstruction: Secondary | ICD-10-CM

## 2018-04-06 DIAGNOSIS — I2510 Atherosclerotic heart disease of native coronary artery without angina pectoris: Secondary | ICD-10-CM

## 2018-04-06 DIAGNOSIS — Z79891 Long term (current) use of opiate analgesic: Secondary | ICD-10-CM | POA: Diagnosis not present

## 2018-04-06 DIAGNOSIS — M25551 Pain in right hip: Secondary | ICD-10-CM

## 2018-04-06 DIAGNOSIS — M1610 Unilateral primary osteoarthritis, unspecified hip: Secondary | ICD-10-CM

## 2018-04-06 MED ORDER — HYDROCODONE-ACETAMINOPHEN 7.5-325 MG PO TABS: 1.0000 | ORAL_TABLET | Freq: Two times a day (BID) | ORAL | 0 refills | Status: DC | PRN

## 2018-04-06 MED ORDER — TAMSULOSIN HCL 0.4 MG PO CAPS: 0.4000 mg | ORAL_CAPSULE | Freq: Every day | ORAL | 3 refills | Status: DC

## 2018-04-06 NOTE — Assessment & Plan Note (Signed)
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  Doing well with Foley catheter, bladder is totally decompressed today.  With he is maintaining it well at home.  No symptoms to suggest urinary tract infection, I told him it is okay to stop the Keflex at this point.  I refilled tamsulosin for 90-day supply of the long-term medication.  We did decrease his opioid to only 2 tablets/day instead of 3.  He is given follow-up with urology later this week for a voiding trial.  I will defer checking a PSA today, I think it would be falsely elevated in the setting of the Foley catheter.

## 2018-04-06 NOTE — Progress Notes (Signed)
   Assessment and Plan:  See Encounters tab for problem-based medical decision making.   __________________________________________________________  HPI:   72 year old man here for follow-up of his chronic low back and hip pain.  Last week he went to the emergency department for increasing pain in his lower abdomen.  A CT of his abdomen and pelvis ruled out a renal stone and found a very distended bladder.  He had a Foley catheter placed with over 1 L of urine returned.  He was discharged home with Flomax, Keflex for urinary tract infection based on urinalysis.  Urine culture from that specimen had multiple species consistent with contamination.  He reports the Foley catheter is doing fine, he empties the bag every day.  Good urine production.  No pain.  He reports his low back pain is stable, feels a little bit.  Last prescription for note, was 9/16.  He ran out a few days ago.  Denies any withdrawal side effects.  Says he is having a little bit of low back pain.  Still functional, says he is harder for him to get around without this medication.  He thinks he still finds great benefit from the [place].  Prior to this episode he denies having chronic lower urinary tract symptoms or obstructive symptoms.  __________________________________________________________  Problem List: Patient Active Problem List   Diagnosis Date Noted  . Chronic use of opiate for therapeutic purpose 05/01/2015    Priority: High  . CAD (coronary artery disease) 03/17/2012    Priority: High  . Tobacco use disorder 04/18/2006    Priority: High  . Mild alcohol use disorder 12/09/2016    Priority: Medium  . Atherosclerosis of aorta ([place]) 01/13/2015    Priority: Medium  . Prediabetes 12/30/2014    Priority: Medium  . AAA (abdominal aortic aneurysm) ([place])     Priority: Medium  . Essential hypertension 04/18/2006    Priority: Medium  . GERD 04/18/2006    Priority: Medium  . Premature atrial contractions 07/31/2015      Priority: Low  . Healthcare maintenance 06/30/2012    Priority: Low  . Erectile dysfunction 10/12/2010    Priority: Low  . Arthropathy of pelvic region and thigh 05/02/2008    Priority: Low  . 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    Priority: Low  . Gout 04/18/2006    Priority: Low  . Bladder outlet obstruction 04/02/2018  . Prostate enlargement 04/02/2018    Medications: Reconciled today in Epic __________________________________________________________  Physical Exam:  Vital Signs: Vitals:   04/06/18 0950  BP: 132/70  Pulse: 68  Temp: 98 F (36.7 C)  TempSrc: Oral  SpO2: 96%  Weight: 190 lb 3.2 oz (86.3 kg)    Gen: Well appearing, NAD CV: RRR, no murmurs Pulm: Normal effort, CTA throughout, no wheezing Abd: Soft, NT, ND, normal BS, bladder is decompressed and nonpalpable Ext: Warm, no edema, normal joints

## 2018-04-06 NOTE — Assessment & Plan Note (Signed)
Stable chronic pain of his low back and hip.  Historically has been used 3 tablets of Norco daily with good benefit from his functional status.  I do not think he has much opioid dependence, has been out of the medication for several days without any symptoms of withdrawal.  Recently had bladder outlet obstruction requiring catheter decompression, which I think is probably in part is an adverse effects from opioids.  He still finds that they give him good benefits, feels like without the medicine he will have much less functional status.  Plan is to decrease Norco to 7.5 mg 2 tablets/day, 60 tablets/month, I filled a 13-month supply.

## 2018-04-06 NOTE — Assessment & Plan Note (Signed)
Stable problem, no angina or other chest pain equivalents.  Plan is to continue with aspirin and rosuvastatin.

## 2018-04-06 NOTE — Patient Instructions (Signed)
Please follow-up with the urologist on October 24 to remove the Foley catheter from your bladder if able.  Continue using tamsulosin, which I refilled.  You may stop the antibiotic.  We talked about decreasing your pain medication because it probably contributed to this bladder problem.  Try using only 2 pain medicine pills each day, and we will follow-up with you closely.

## 2018-04-08 DIAGNOSIS — R3912 Poor urinary stream: Secondary | ICD-10-CM | POA: Diagnosis not present

## 2018-04-08 DIAGNOSIS — R350 Frequency of micturition: Secondary | ICD-10-CM | POA: Diagnosis not present

## 2018-04-08 DIAGNOSIS — R3914 Feeling of incomplete bladder emptying: Secondary | ICD-10-CM | POA: Diagnosis not present

## 2018-04-10 DIAGNOSIS — R338 Other retention of urine: Secondary | ICD-10-CM | POA: Diagnosis not present

## 2018-05-11 ENCOUNTER — Other Ambulatory Visit: Payer: Self-pay | Admitting: Student in an Organized Health Care Education/Training Program

## 2018-05-11 DIAGNOSIS — M1A0610 Idiopathic chronic gout, right knee, without tophus (tophi): Secondary | ICD-10-CM

## 2018-05-11 DIAGNOSIS — I25118 Atherosclerotic heart disease of native coronary artery with other forms of angina pectoris: Secondary | ICD-10-CM

## 2018-05-11 NOTE — Telephone Encounter (Signed)
Call made to both [facility] and patient for clarification-pt will need a new rx for allopurinol sent into [facility].  He has refills left on his metoprolol, but [employer] has not shipped it out yet.  Pt asking for metoprolol 7 day supply (#14) be [facility] into [facility] local [facility] on [address].

## 2018-05-11 NOTE — Telephone Encounter (Signed)
Pt returning call about his medication refill on his LOPRESSOR

## 2018-05-11 NOTE — Telephone Encounter (Signed)
metoprolol tartrate (LOPRESSOR) 50 MG tablet, refill request @  [id number] [place] [address] [place] [phone] (Phone) [phone] (Fax)

## 2018-05-12 MED ORDER — METOPROLOL TARTRATE 50 MG PO TABS: 50.0000 mg | ORAL_TABLET | Freq: Two times a day (BID) | ORAL | 0 refills | Status: DC

## 2018-05-12 MED ORDER — ALLOPURINOL 100 MG PO TABS: 100.0000 mg | ORAL_TABLET | Freq: Every day | ORAL | 3 refills | Status: DC

## 2018-05-27 ENCOUNTER — Other Ambulatory Visit: Payer: Self-pay | Admitting: Student in an Organized Health Care Education/Training Program

## 2018-06-17 IMAGING — CT CT CHEST W/O CM
2 of 4 series · 15 of 36 positions shown, 18 images · non-contrast
Comparison: 01/24/2015

CLINICAL DATA: Right-sided lung cancer in [age] with surgery only.
Right upper lobectomy.

EXAM:
CT CHEST WITHOUT CONTRAST
TECHNIQUE: Multidetector CT imaging of the chest was performed following the
standard protocol without IV contrast.

[Series 2: chest w/o st · axial · non-contrast · 0.79mm/px · z∈[-384,-98]mm · 12 of 171 slices shown, 15 images]
[im 14/171  mediastinal]
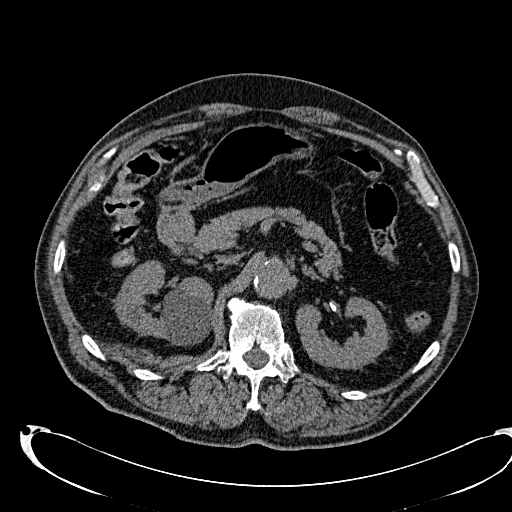
[im 14/171  lung]
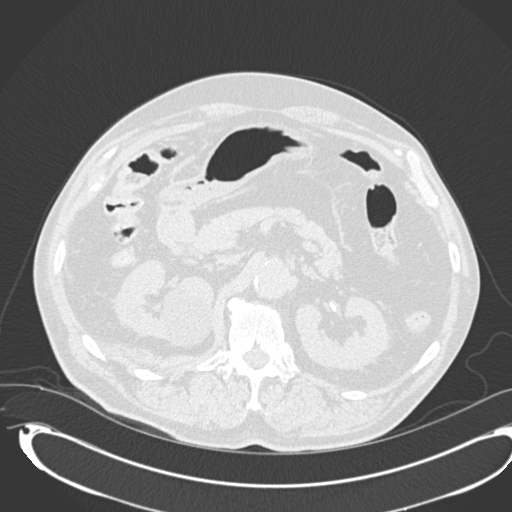
[im 27/171  lung]
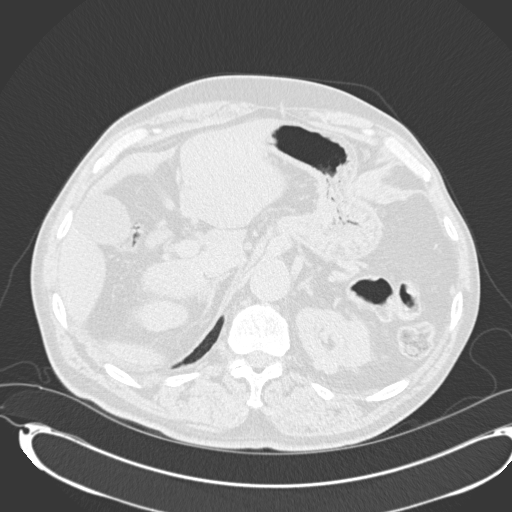
[im 40/171  lung]
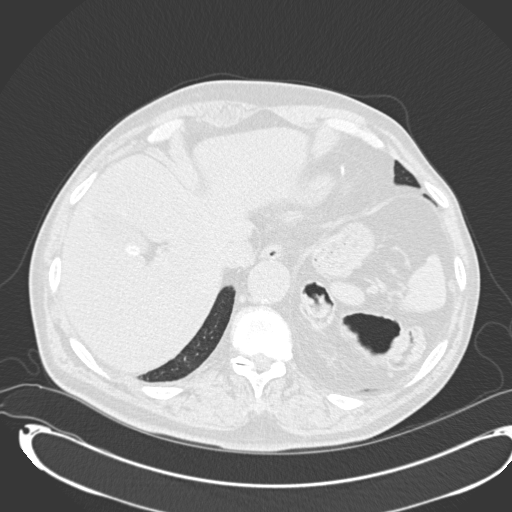
[im 53/171  lung]
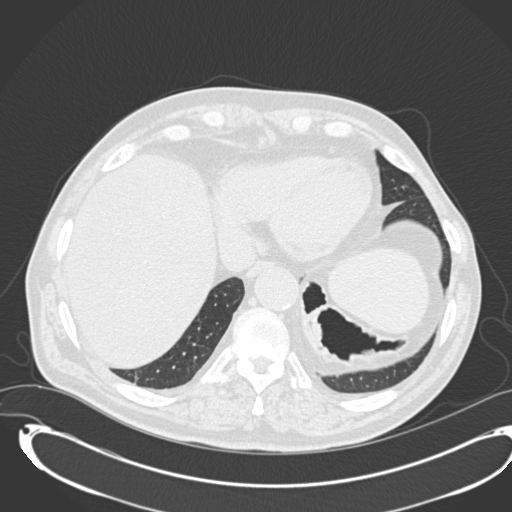
[im 66/171  mediastinal]
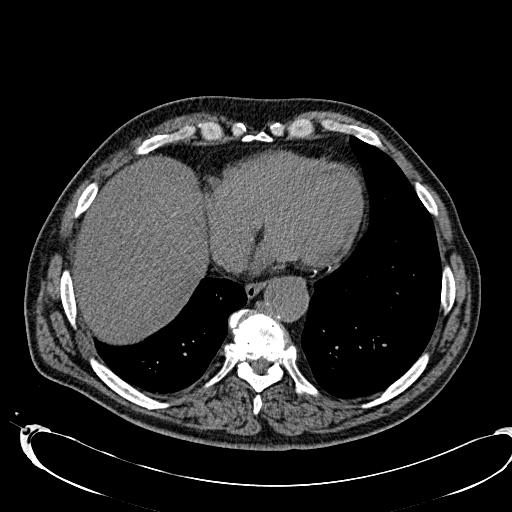
[im 66/171  lung]
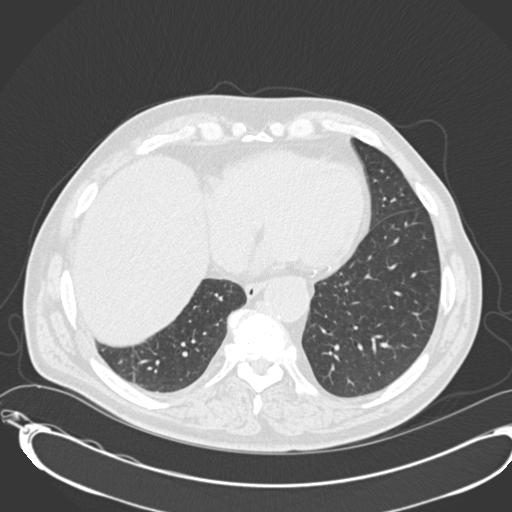
[im 79/171  lung]
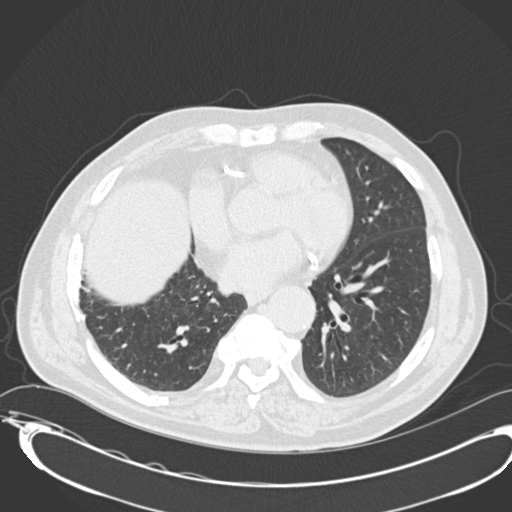
[im 92/171  lung]
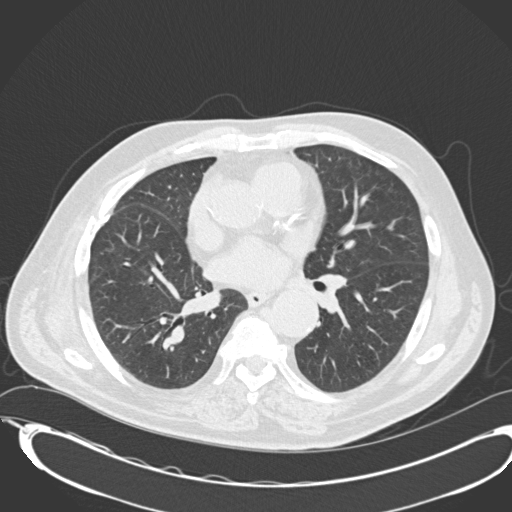
[im 105/171  lung]
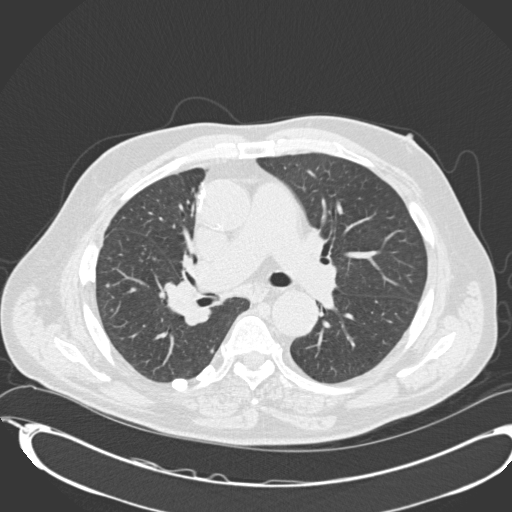
[im 118/171  mediastinal]
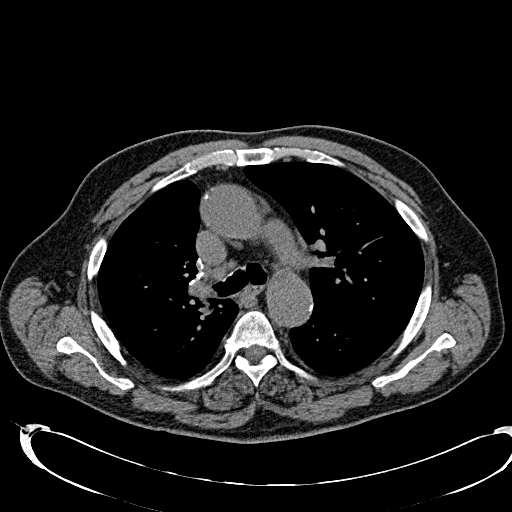
[im 118/171  lung]
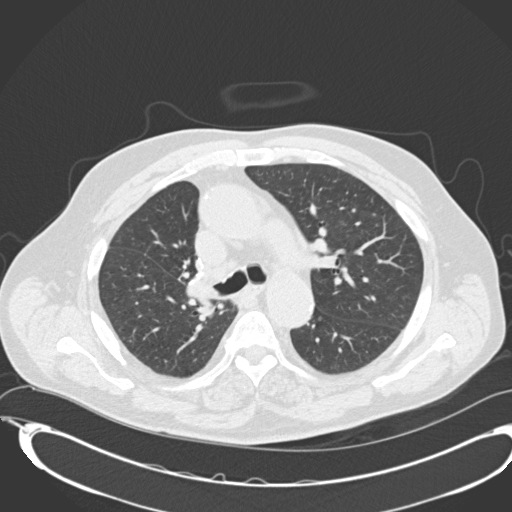
[im 131/171  lung]
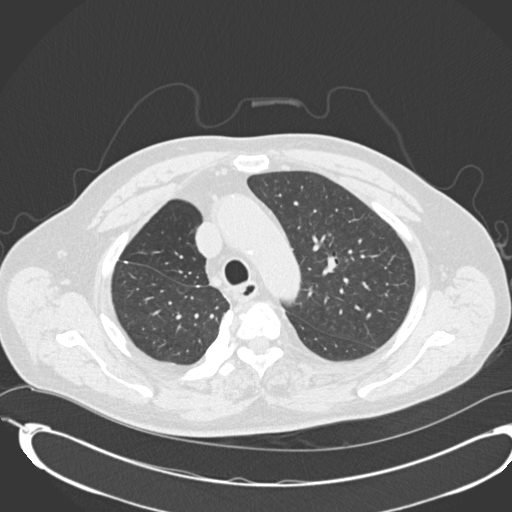
[im 144/171  lung]
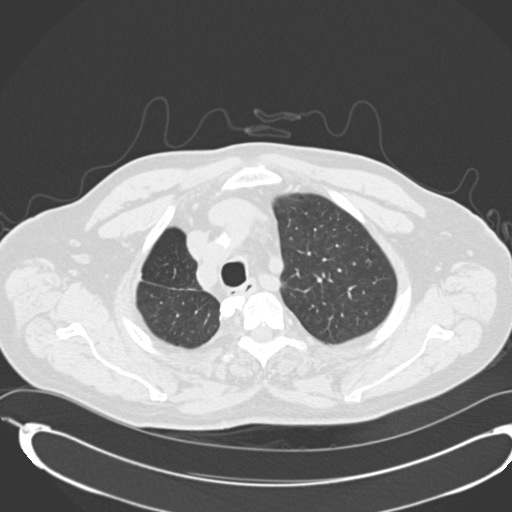
[im 157/171  lung]
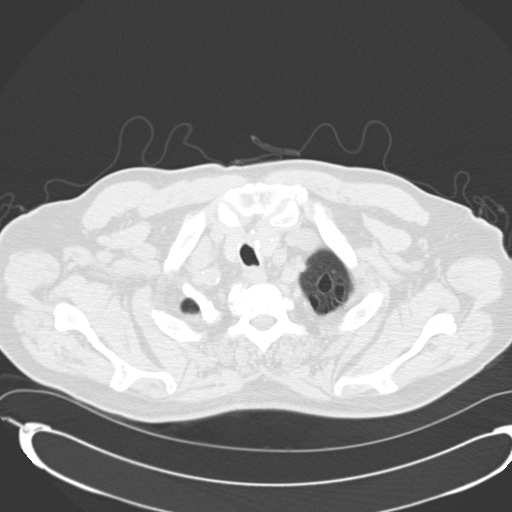

[Series 602: <mpr thick range> · coronal · 0.79mm/px · 3 of 143 slices shown]
[im 29/143  lung]
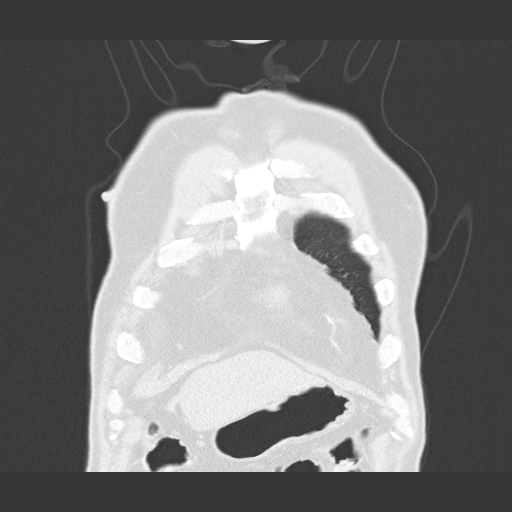
[im 57/143  lung]
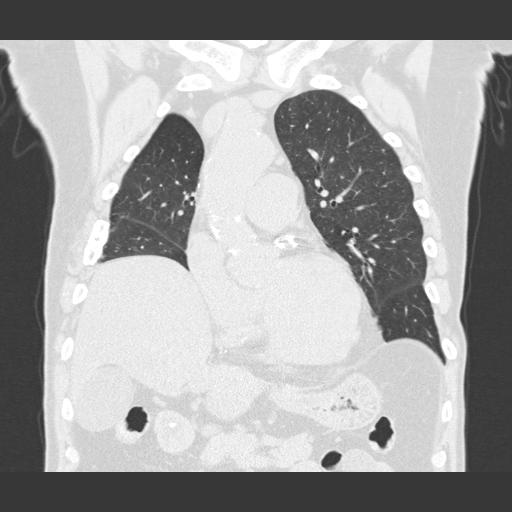
[im 86/143  lung]
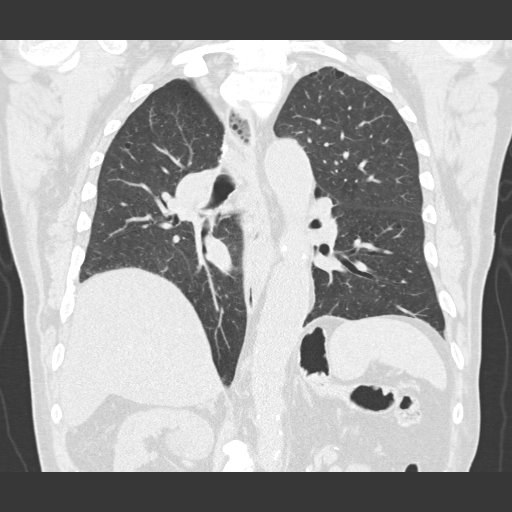

[15 of 36 positions shown; findings below may reference images not displayed]

FINDINGS: Cardiovascular: Advanced aortic and branch vessel atherosclerosis.
Tortuous thoracic aorta. Mild cardiomegaly with multivessel coronary
artery atherosclerosis. Areas of prior left ventricular infarct, as
previously.

Pulmonary artery enlargement, including 3.7 cm outflow tract.

Mediastinum/Nodes: No supraclavicular adenopathy. No mediastinal or
definite hilar adenopathy, given limitations of unenhanced CT.

Lungs/Pleura: No pleural fluid. Status post right upper lobectomy.
Multiple bilateral pulmonary nodules which are not readily apparent
on the prior exam. Examples within the right lower lobe at 4 mm on
image 52/series 5 and within the left lower lobe at 5 mm on image
57/series 5.

Upper Abdomen: Moderate hepatic steatosis. Cholelithiasis. Normal
imaged portions of the spleen, stomach, pancreas, adrenal glands.
Similar appearance of 2 adjacent hyperattenuating upper pole left
renal lesions which each measure on the order of 10 mm. Bilateral
aortic and renal vascular calcifications. Right renal cysts.

Musculoskeletal: Posttraumatic or postsurgical rib defects.
IMPRESSION: 1. Status post right upper lobectomy.
2. Multiple bilateral tiny pulmonary nodules. These are not readily
apparent on the prior exam. This could be related to thinner slice
combination today. However, developing pulmonary nodularity cannot
be excluded. Consider follow-up at 6 month with chest CT.
3.  Coronary artery atherosclerosis. Aortic atherosclerosis.
4. Hepatic steatosis.
5. Pulmonary artery enlargement suggests pulmonary arterial
hypertension.
6. Similar size of indeterminate upper pole left renal lesions.
Recommend attention on follow-up.
7. Cholelithiasis.

## 2018-07-01 ENCOUNTER — Other Ambulatory Visit: Payer: Self-pay

## 2018-07-01 DIAGNOSIS — M25551 Pain in right hip: Secondary | ICD-10-CM

## 2018-07-01 DIAGNOSIS — M25552 Pain in left hip: Principal | ICD-10-CM

## 2018-07-01 DIAGNOSIS — Z79891 Long term (current) use of opiate analgesic: Secondary | ICD-10-CM

## 2018-07-01 MED ORDER — HYDROCODONE-ACETAMINOPHEN 7.5-325 MG PO TABS: 1.0000 | ORAL_TABLET | Freq: Two times a day (BID) | ORAL | 0 refills | Status: DC | PRN

## 2018-07-01 NOTE — Telephone Encounter (Signed)
HYDROcodone-acetaminophen (NORCO) 7.5-325 MG tablet  Refill request @  [id number] - [place], [place] [address] [phone] (Phone) [phone] (Fax)

## 2018-07-09 ENCOUNTER — Encounter: Payer: Self-pay | Admitting: *Deleted

## 2018-07-13 ENCOUNTER — Encounter: Payer: Self-pay | Admitting: Student in an Organized Health Care Education/Training Program

## 2018-07-13 ENCOUNTER — Ambulatory Visit (INDEPENDENT_AMBULATORY_CARE_PROVIDER_SITE_OTHER): Payer: Medicare HMO | Admitting: Student in an Organized Health Care Education/Training Program

## 2018-07-13 ENCOUNTER — Ambulatory Visit (HOSPITAL_COMMUNITY)
Admission: RE | Admit: 2018-07-13 | Discharge: 2018-07-13 | Disposition: A | Discharge status: Home / Self Care | Payer: Medicare HMO | Source: Ambulatory Visit | Attending: Student in an Organized Health Care Education/Training Program | Admitting: Student in an Organized Health Care Education/Training Program

## 2018-07-13 VITALS — BP 128/64 | HR 65 | Temp 98.0°F | Wt 190.6 lb

## 2018-07-13 DIAGNOSIS — Z87448 Personal history of other diseases of urinary system: Secondary | ICD-10-CM | POA: Diagnosis not present

## 2018-07-13 DIAGNOSIS — M86671 Other chronic osteomyelitis, right ankle and foot: Secondary | ICD-10-CM | POA: Insufficient documentation

## 2018-07-13 DIAGNOSIS — M1A0610 Idiopathic chronic gout, right knee, without tophus (tophi): Secondary | ICD-10-CM | POA: Diagnosis not present

## 2018-07-13 DIAGNOSIS — M25552 Pain in left hip: Secondary | ICD-10-CM

## 2018-07-13 DIAGNOSIS — Z79899 Other long term (current) drug therapy: Secondary | ICD-10-CM

## 2018-07-13 DIAGNOSIS — M25551 Pain in right hip: Secondary | ICD-10-CM | POA: Diagnosis not present

## 2018-07-13 DIAGNOSIS — G8929 Other chronic pain: Secondary | ICD-10-CM | POA: Diagnosis not present

## 2018-07-13 DIAGNOSIS — I252 Old myocardial infarction: Secondary | ICD-10-CM

## 2018-07-13 DIAGNOSIS — Z720 Tobacco use: Secondary | ICD-10-CM

## 2018-07-13 DIAGNOSIS — M109 Gout, unspecified: Secondary | ICD-10-CM

## 2018-07-13 DIAGNOSIS — R7303 Prediabetes: Secondary | ICD-10-CM | POA: Diagnosis not present

## 2018-07-13 DIAGNOSIS — I2510 Atherosclerotic heart disease of native coronary artery without angina pectoris: Secondary | ICD-10-CM

## 2018-07-13 DIAGNOSIS — M19071 Primary osteoarthritis, right ankle and foot: Secondary | ICD-10-CM | POA: Diagnosis not present

## 2018-07-13 DIAGNOSIS — Z79891 Long term (current) use of opiate analgesic: Secondary | ICD-10-CM

## 2018-07-13 DIAGNOSIS — Z7982 Long term (current) use of aspirin: Secondary | ICD-10-CM

## 2018-07-13 HISTORY — DX: Other chronic osteomyelitis, right ankle and foot: M86.671

## 2018-07-13 LAB — GLUCOSE, CAPILLARY: Glucose-Capillary: 92 mg/dL (ref 70–99)

## 2018-07-13 LAB — POCT GLYCOSYLATED HEMOGLOBIN (HGB A1C): HEMOGLOBIN A1C: 5.8 % — AB (ref 4.0–5.6)

## 2018-07-13 MED ORDER — HYDROCODONE-ACETAMINOPHEN 7.5-325 MG PO TABS: 1.0000 | ORAL_TABLET | Freq: Two times a day (BID) | ORAL | 0 refills | Status: DC | PRN

## 2018-07-13 MED ORDER — TAMSULOSIN HCL 0.4 MG PO CAPS: 0.8000 mg | ORAL_CAPSULE | Freq: Every day | ORAL | 3 refills | Status: DC

## 2018-07-13 NOTE — Assessment & Plan Note (Signed)
Follow-up hemoglobin A1c today.

## 2018-07-13 NOTE — Patient Instructions (Signed)
I am worried about the blood flow in your right foot.  We are going to check a x-ray of your right toe to see if there is any damage done to the bone.  Depending on that result, we may need to send you back to the surgeon if this pain continues to bother you.  Continue taking all your other medicines as prescribed.  Follow-up with me in 3 months.

## 2018-07-13 NOTE — Assessment & Plan Note (Signed)
Worsening pain on the right toe.  Last evaluated by vascular surgery in 2017.  On exam it looks stable.  He is at risk for vascular disease progression given ongoing tobacco use.  Our in office ABI is normal, but leg waveforms look monophasic to me.  Could not get a toe brachial index with our machine.  Will get a x-ray of the right foot to evaluate for bony changes.  If he continues to have function of edema pain, would have to refer back to vascular surgery and he will need to consider surgical intervention.

## 2018-07-13 NOTE — Assessment & Plan Note (Signed)
Stable, no recent gout flares.  Check uric acid today.  Continue allopurinol.

## 2018-07-13 NOTE — Progress Notes (Signed)
   Assessment and Plan:  See Encounters tab for problem-based medical decision making.   __________________________________________________________  HPI:   73 year old man here for follow-up of his chronic pain in his right hip for which he uses chronic opioid medication.  At our last visit in October he had an acute complication of urinary retention due to enlarged prostate.  He had a Foley catheter placed at that time which was subsequently removed by urology.  He is going to follow-up with their office in April.  He denies having any further lower urinary tract symptoms or obstructive type symptoms.  We decreased his opioid dose at that time to just 2 tablets/day of hydrocodone 7.5.  He says he has struggled a little bit with this change.  He has had increasing pain in his right foot around the third toe.  Some increased pain of his hip as well.  Not function limiting, still independent, gets around okay by himself.  Independent in all activities of daily living.  Regarding the right third toe he says this was evaluated about a year ago.  On chart review I can see that he saw Dr. Scot Dock with vascular surgery in March 2017.  He had some mild decrease flow in his toe at that time.  X-ray showed possible chronic osteomyelitis.  They decided to treated conservatively and wound healed on its own.  He continues to tobacco smoke daily.  __________________________________________________________  Problem List: Patient Active Problem List   Diagnosis Date Noted  . Chronic osteomyelitis involving ankle and foot, right ([place]) 07/13/2018    Priority: High  . Chronic use of opiate for therapeutic purpose 05/01/2015    Priority: High  . CAD (coronary artery disease) 03/17/2012    Priority: High  . Tobacco use disorder 04/18/2006    Priority: High  . Prostate enlargement 04/02/2018    Priority: Medium  . Mild alcohol use disorder 12/09/2016    Priority: Medium  . Atherosclerosis of aorta ([place])  01/13/2015    Priority: Medium  . Prediabetes 12/30/2014    Priority: Medium  . AAA (abdominal aortic aneurysm) ([place])     Priority: Medium  . Essential hypertension 04/18/2006    Priority: Medium  . GERD 04/18/2006    Priority: Medium  . Premature atrial contractions 07/31/2015    Priority: Low  . Healthcare maintenance 06/30/2012    Priority: Low  . Erectile dysfunction 10/12/2010    Priority: Low  . Arthropathy of pelvic region and thigh 05/02/2008    Priority: Low  . 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    Priority: Low  . Gout 04/18/2006    Priority: Low    Medications: Reconciled today in Epic __________________________________________________________  Physical Exam:  Vital Signs: Vitals:   07/13/18 0920  BP: 128/64  Pulse: 65  Temp: 98 F (36.7 C)  TempSrc: Oral  SpO2: 98%  Weight: 190 lb 9.6 oz (86.5 kg)    Gen: Well appearing, NAD CV: RRR, no murmurs Pulm: Normal effort, CTA throughout, no wheezing Ext: Warm, no edema, right foot is wrapped with no medial arch, foot is warm with a palpable DP pulse, cannot palpate a PT pulse.  Right third toe is discolored with some keratotic changes at the tip and changes to the nail, large hallux valgus.

## 2018-07-13 NOTE — Assessment & Plan Note (Signed)
Acute MI in 2004.  Stable since then with no angina.  Still high risk for progression given tobacco use is ongoing.  Plan is to check lipids today.  Continue with aspirin and statin.

## 2018-07-13 NOTE — Assessment & Plan Note (Signed)
Stable, we had to decrease his daily opioid dosage because 3 months ago he had a complication of bladder outlet obstruction and urine retention requiring Foley catheter placement.  This has since resolved.  Treated office he is got a little bit worsening pain especially in his right third toe and hip.  Not exactly function limiting.  Good to continue with hydrocodone 7.5 mg twice daily, #60 for a 1 month supply.  Will send in 3 prescriptions.  Follow-up with me in 3 months.

## 2018-07-14 ENCOUNTER — Telehealth: Payer: Self-pay | Admitting: Student in an Organized Health Care Education/Training Program

## 2018-07-14 LAB — LIPID PANEL
CHOL/HDL RATIO: 2.2 ratio (ref 0.0–5.0)
CHOLESTEROL TOTAL: 112 mg/dL (ref 100–199)
HDL: 51 mg/dL (ref >39)
LDL CALC: 51 mg/dL (ref 0–99)
TRIGLYCERIDES: 51 mg/dL (ref 0–149)
VLDL CHOLESTEROL CAL: 10 mg/dL (ref 5–40)

## 2018-07-14 LAB — URIC ACID: Uric Acid: 4.8 mg/dL (ref 3.7–8.6)

## 2018-07-14 LAB — BMP8+ANION GAP
Anion Gap: 19 mmol/L — ABNORMAL HIGH (ref 10.0–18.0)
BUN / CREAT RATIO: 14 (ref 10–24)
BUN: 18 mg/dL (ref 8–27)
CO2: 20 mmol/L (ref 20–29)
CREATININE: 1.31 mg/dL — AB (ref 0.76–1.27)
Calcium: 9.7 mg/dL (ref 8.6–10.2)
Chloride: 105 mmol/L (ref 96–106)
GFR calc Af Amer: 62 mL/min/{1.73_m2} (ref >59)
GFR, EST NON AFRICAN AMERICAN: 54 mL/min/{1.73_m2} — AB (ref >59)
Glucose: 95 mg/dL (ref 65–99)
Potassium: 3.9 mmol/L (ref 3.5–5.2)
Sodium: 144 mmol/L (ref 134–144)

## 2018-07-14 NOTE — Telephone Encounter (Signed)
Spoke by phone with patient.  Blood work looks okay.  The right third toe which is giving him progressive pain again shows some changes consistent with chronic osteomyelitis.  Given the monophasic waveforms on ABIs I think this is most likely due to peripheral artery disease.  His chronic tobacco use is the biggest risk factor for this.  We talked about options, including referral to vascular surgery.  Patient is going to think about his options.  Pain might get better with time, especially if he is able to quit smoking.  I counseled him that if the pain gets worse or becomes function limiting, should go to vascular surgery for treatment of his peripheral artery disease and likely amputation of the right third toe.

## 2018-07-24 ENCOUNTER — Other Ambulatory Visit: Payer: Self-pay

## 2018-07-24 NOTE — Telephone Encounter (Signed)
It is too early for a refill but 2 scripts are on file at pharmacy

## 2018-07-24 NOTE — Telephone Encounter (Signed)
HYDROcodone-acetaminophen (NORCO) 7.5-325 MG tablet, refill request @  [id number] - [place], [place] - [address] [phone] (Phone) [phone] (Fax)

## 2018-07-25 ENCOUNTER — Encounter (HOSPITAL_COMMUNITY): Payer: Self-pay | Admitting: Nurse Practitioner

## 2018-07-25 ENCOUNTER — Emergency Department (HOSPITAL_COMMUNITY)
Admission: EM | Admit: 2018-07-25 | Discharge: 2018-07-25 | Disposition: A | Discharge status: Home / Self Care | Payer: Medicare HMO | Attending: Emergency Medicine | Admitting: Emergency Medicine

## 2018-07-25 DIAGNOSIS — Z7982 Long term (current) use of aspirin: Secondary | ICD-10-CM | POA: Diagnosis not present

## 2018-07-25 DIAGNOSIS — I110 Hypertensive heart disease with heart failure: Secondary | ICD-10-CM | POA: Diagnosis not present

## 2018-07-25 DIAGNOSIS — Z85118 Personal history of other malignant neoplasm of bronchus and lung: Secondary | ICD-10-CM | POA: Diagnosis not present

## 2018-07-25 DIAGNOSIS — Z955 Presence of coronary angioplasty implant and graft: Secondary | ICD-10-CM | POA: Insufficient documentation

## 2018-07-25 DIAGNOSIS — F17210 Nicotine dependence, cigarettes, uncomplicated: Secondary | ICD-10-CM | POA: Diagnosis not present

## 2018-07-25 DIAGNOSIS — F1010 Alcohol abuse, uncomplicated: Secondary | ICD-10-CM | POA: Diagnosis not present

## 2018-07-25 DIAGNOSIS — I509 Heart failure, unspecified: Secondary | ICD-10-CM | POA: Insufficient documentation

## 2018-07-25 DIAGNOSIS — M79674 Pain in right toe(s): Secondary | ICD-10-CM

## 2018-07-25 DIAGNOSIS — I2510 Atherosclerotic heart disease of native coronary artery without angina pectoris: Secondary | ICD-10-CM | POA: Insufficient documentation

## 2018-07-25 DIAGNOSIS — N390 Urinary tract infection, site not specified: Secondary | ICD-10-CM | POA: Diagnosis not present

## 2018-07-25 DIAGNOSIS — M86671 Other chronic osteomyelitis, right ankle and foot: Secondary | ICD-10-CM | POA: Diagnosis not present

## 2018-07-25 DIAGNOSIS — R0689 Other abnormalities of breathing: Secondary | ICD-10-CM | POA: Diagnosis not present

## 2018-07-25 DIAGNOSIS — M25552 Pain in left hip: Secondary | ICD-10-CM | POA: Diagnosis not present

## 2018-07-25 DIAGNOSIS — Z79899 Other long term (current) drug therapy: Secondary | ICD-10-CM | POA: Insufficient documentation

## 2018-07-25 DIAGNOSIS — I252 Old myocardial infarction: Secondary | ICD-10-CM | POA: Diagnosis not present

## 2018-07-25 LAB — URINALYSIS, ROUTINE W REFLEX MICROSCOPIC
Glucose, UA: NEGATIVE mg/dL
Ketones, ur: 15 mg/dL — AB
NITRITE: NEGATIVE
Protein, ur: 100 mg/dL — AB
SPECIFIC GRAVITY, URINE: 1.025 (ref 1.005–1.030)
pH: 6 (ref 5.0–8.0)

## 2018-07-25 LAB — URINALYSIS, MICROSCOPIC (REFLEX)
RBC / HPF: NONE SEEN RBC/hpf (ref 0–5)
SQUAMOUS EPITHELIAL / LPF: NONE SEEN (ref 0–5)

## 2018-07-25 MED ORDER — CEPHALEXIN 500 MG PO CAPS: 500.0000 mg | ORAL_CAPSULE | Freq: Two times a day (BID) | ORAL | 0 refills | Status: AC

## 2018-07-25 MED ORDER — HYDROCODONE-ACETAMINOPHEN 5-325 MG PO TABS: 1.0000 | ORAL_TABLET | Freq: Four times a day (QID) | ORAL | 0 refills | Status: DC | PRN

## 2018-07-25 MED ORDER — DOCUSATE SODIUM 100 MG PO CAPS: 100.0000 mg | ORAL_CAPSULE | Freq: Two times a day (BID) | ORAL | 0 refills | Status: DC | PRN

## 2018-07-25 MED ORDER — POLYETHYLENE GLYCOL 3350 17 G PO PACK: 17.0000 g | PACK | Freq: Every day | ORAL | 0 refills | Status: DC

## 2018-07-25 MED ORDER — HYDROCODONE-ACETAMINOPHEN 5-325 MG PO TABS
1.0000 | ORAL_TABLET | Freq: Once | ORAL | Status: AC
  Administered 2018-07-25: 1 via ORAL
  Filled 2018-07-25: qty 1

## 2018-07-25 NOTE — Discharge Instructions (Signed)
Your foot has a chronic infection in the bone of the toe. Keep the callus clean and dry, may consider using callus protectors to the area to help cushion it. Alternate between ibuprofen and hydrocodone (norco) as directed as needed for pain but don't drive or operate machinery while taking norco. Ice and elevate the foot to help with pain. Follow up with the vascular surgeon for ongoing management of your toe issue. Return to the ER for emergent changes or worsening symptoms.   You also have a urinary tract infection. Stay very well hydrated with plenty of water throughout the day. Take antibiotic until completed. Follow up with your primary care physician in 1 week for recheck of ongoing symptoms but return to ER for emergent changing or worsening of symptoms. Please seek immediate care if you develop the following: You develop back pain.  Your symptoms are no better, or worse in 3 days. There is severe back pain or lower abdominal pain.  You develop chills.  You have a fever.  There is nausea or vomiting.  There is continued burning or discomfort with urination.

## 2018-07-25 NOTE — ED Triage Notes (Signed)
Pt is c/o right toe pain that he reports seeing a foot specialist for. He states he is out of his chronic pain medicine and he "re-ups" on the 11th and cannot take the pain anymore. He is prescribed hydrocodone for chronic hip pain but reports he has had to use it for the toe pain explaining why is he out before prescribed time.

## 2018-07-25 NOTE — ED Provider Notes (Signed)
[facility] [place] DEPT Provider Note   CSN: [id number] Arrival date & time: 07/25/18  1506     History   Chief Complaint Chief Complaint  Patient presents with  . Toe Pain    Right    HPI [name] is a 73 y.o. male with a PMHx of AAA, CHF, gout, HTN, HLD, MI, and other conditions listed below, who presents to the ED with complaints of ongoing worsening R 3rd toe pain that started bothering him about 1wk ago. Per chart review, pt saw Dr. 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  He was seen by his PCP on 07/13/18 and they did xrays which did not show any evidence of acute osteomyelitis at this time but showed evidence of remote bony destruction/resorption, c/w chronic osteomyelitis; he was referred back to vascular surgery.  He states that over the last week his pain has worsened, and he ran out of his home hydrocodone 3 days ago which he takes for his chronic hip pain, but because of his toe bothering him he's taking more than usual so he ran out, so he came here because of his pain.  He describes the pain as 10/10 constant sharp nonradiating right third toe pain that worsens with walking and has been somewhat improved with hydrocodone, no other treatments tried prior to arrival.  He reports having a scab/callus on the bottom of the toe.  He denies any drainage, erythema, swelling, numbness, tingling, focal weakness, or any other complaints related to this.  He mentions that 3 days ago he started having cloudy urine and wants to be checked for a UTI.  He denies having any fevers, chills, chest pain, shortness breath, abdominal pain, nausea, vomiting, diarrhea, constipation, dysuria, hematuria, or any other complaints.  The history is provided by the patient and medical records. No language interpreter was used.    Toe Pain  Pertinent negatives include no chest pain, no abdominal pain and no shortness of breath.    Past Medical History:  Diagnosis Date  . AAA (abdominal aortic aneurysm) (HCC)    noted CT 11/2012 size 3.3 x 3.2 cm  . Alcohol abuse   . Barretts esophagus    bx distal esophagus 2011 neg   . Bladder outlet obstruction 04/02/2018  . Cataract   . Cataract   . CHF (congestive heart failure) ([place])   . Cholelithiasis   . 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   2006 - 3 adenomas largest 12 mm  2008 - no polyps 2013-  2 mm cecal polyp, adenoma, routine repeat colonoscopy 02/2017 approx 05/14/2017 18 polyps max 12 mm ALL adenomas recall 2019 -  [name], MD, [facility]     . Diverticulosis of colon   . DJD (degenerative joint disease)    left hip  . Full dentures    upper and lower  . GERD (gastroesophageal reflux disease)    diet controlled   . Gout    feet  . Hiatal hernia    2 cm noted on upper endos 2011   . Hx of adenomatous colonic polyps   . Hyperlipidemia   . Hypertension   . Internal hemorrhoid   . 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 Myocardial infarction [facility]) 2004   hx of, 2 stents 2004. pt was  on Plavix x 3 years then transitioned to ASA, no problems since  . Non-small cell carcinoma of right lung, stage 1 ([place]) 01/30/2016  . Renal failure, acute (HCC)    hx of 2/2 medication, resolved  . Tobacco abuse    smoker .5 ppd x 55 yrs  . UTI (lower urinary tract infection)     Patient Active Problem List   Diagnosis Date Noted  . Chronic osteomyelitis involving ankle and foot, right ([place]) 07/13/2018  . Prostate enlargement 04/02/2018  . Mild alcohol use disorder 12/09/2016  . Premature atrial contractions 07/31/2015  . Chronic use of opiate for therapeutic purpose 05/01/2015  . Atherosclerosis of aorta ([place]) 01/13/2015  . Prediabetes 12/30/2014  . AAA (abdominal aortic aneurysm) ([place])   . Healthcare maintenance  06/30/2012  . CAD (coronary artery disease) 03/17/2012  . Erectile dysfunction 10/12/2010  . Arthropathy of pelvic region and thigh 05/02/2008  . 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  . Gout 04/18/2006  . Tobacco use disorder 04/18/2006  . Essential hypertension 04/18/2006  . GERD 04/18/2006    Past Surgical History:  Procedure Laterality Date  . cataract surgery Bilateral 2018   removal of cataracts  . COLONOSCOPY     hx polyps - [name] 02/2012  . CORONARY ANGIOPLASTY WITH STENT PLACEMENT    . flexible cystourethroscopy  05/24/06  . INGUINAL HERNIA REPAIR  2005  . LUNG LOBECTOMY Right 11/07   RUL  . MULTIPLE TOOTH EXTRACTIONS    . UPPER GASTROINTESTINAL ENDOSCOPY          Home Medications    Prior to Admission medications   Medication Sig Start Date End Date Taking? Authorizing Provider  albuterol (PROVENTIL HFA;VENTOLIN HFA) 108 (90 Base) MCG/ACT inhaler Inhale 2 puffs into the lungs every 6 (six) hours as needed for wheezing or shortness of breath. Patient not taking: Reported on 04/02/2018 06/02/17   Axel Filler, MD  allopurinol (ZYLOPRIM) 100 MG tablet Take 1 tablet (100 mg total) by mouth daily. 05/12/18   Axel Filler, MD  amLODipine (NORVASC) 10 MG tablet TAKE 1 TABLET EVERY DAY Patient taking differently: Take 10 mg by mouth daily.  01/21/18   Axel Filler, MD  aspirin EC 81 MG tablet Take 81 mg by mouth daily.    [provider]  HYDROcodone-acetaminophen (NORCO) 7.5-325 MG tablet Take 1 tablet by mouth 2 (two) times daily as needed for moderate pain. 07/13/18   Axel Filler, MD  metoprolol tartrate (LOPRESSOR) 50 MG tablet Take 1 tablet (50 mg total) by mouth 2 (two) times daily. 05/12/18   Axel Filler, MD  Multiple Vitamin (MULTIVITAMIN) tablet Take 1 tablet by mouth daily.    [provider]  rosuvastatin (CRESTOR) 20 MG tablet Take 1 tablet (20 mg total) by mouth daily. 05/27/18   Axel Filler, MD  tamsulosin (FLOMAX) 0.4 MG CAPS capsule Take 2 capsules (0.8 mg total) by mouth daily. 07/13/18   Axel Filler, MD    Family History Family History  Problem Relation Age of Onset  . Diabetes Mother   . Hypertension Sister   . Heart disease Sister   . Diabetes Sister   . Arthritis Brother   . Gout Brother   . Colon cancer Neg Hx   . Esophageal cancer Neg Hx   . Rectal cancer Neg Hx   . Stomach cancer Neg Hx   . Colon polyps Neg Hx     Social History Social History  Tobacco Use  . Smoking status: Current Every Day Smoker    Packs/day: 0.50    Years: 55.00    Pack years: 27.50    Types: Cigarettes  . Smokeless tobacco: Never Used  Substance Use Topics  . Alcohol use: Yes    Alcohol/week: 0.0 standard drinks    Comment: drinking 1-2 pts gin/week, (mixed with tap water not tonic)  . Drug use: No     Allergies   Candesartan cilexetil-hctz; Hydrochlorothiazide; and Shellfish allergy   Review of Systems Review of Systems  Constitutional: Negative for chills and fever.  Respiratory: Negative for shortness of breath.   Cardiovascular: Negative for chest pain.  Gastrointestinal: Negative for abdominal pain, constipation, diarrhea, nausea and vomiting.  Genitourinary: Negative for dysuria and hematuria.       +cloudy urine  Musculoskeletal: Positive for arthralgias. Negative for joint swelling and myalgias.  Skin: Positive for wound. Negative for color change.  Allergic/Immunologic: Negative for immunocompromised state.  Neurological: Negative for weakness and numbness.  Psychiatric/Behavioral: Negative for confusion.   All other systems reviewed and are negative for acute change except as noted in the HPI.    Physical Exam Updated Vital Signs BP 108/83 (BP Location: Left Arm)   Pulse 98   Temp 97.9 F (36.6 C)   Resp 14   SpO2 97%   Physical Exam Vitals signs and nursing note reviewed.  Constitutional:      General: He is not in  acute distress.    Appearance: Normal appearance. He is well-developed. He is not toxic-appearing.     Comments: Afebrile, nontoxic, NAD  HENT:     Head: Normocephalic and atraumatic.  Eyes:     General:        Right eye: No discharge.        Left eye: No discharge.     Conjunctiva/sclera: Conjunctivae normal.  Neck:     Musculoskeletal: Normal range of motion and neck supple.  Cardiovascular:     Rate and Rhythm: Normal rate and regular rhythm.     Pulses: Normal pulses.     Heart sounds: Normal heart sounds, S1 normal and S2 normal. No murmur. No friction rub. No gallop.   Pulmonary:     Effort: Pulmonary effort is normal. No respiratory distress.     Breath sounds: Normal breath sounds. No decreased breath sounds, wheezing, rhonchi or rales.  Abdominal:     General: Bowel sounds are normal. There is no distension.     Palpations: Abdomen is soft. Abdomen is not rigid.     Tenderness: There is no abdominal tenderness. There is no right CVA tenderness, left CVA tenderness, guarding or rebound. Negative signs include Murphy's sign and McBurney's sign.  Musculoskeletal:     Right foot: Decreased range of motion (3rd toe due to pain and chronic deformity). Tenderness, bony tenderness, deformity (chronic) and laceration (callus) present. No swelling or crepitus.     Comments: R 3rd toe with large callus to the plantar aspect of the toe tip, no erythema or warmth, no drainage or fluctuance, no red streaking, no swelling, diffuse TTP to the distal toe, limited ROM due to pain and due to chronic deformity. Strength and sensation grossly intact, distal pulses actually intact at DP pulse, soft compartments. SEE PICTURE BELOW  Skin:    General: Skin is warm and dry.     Findings: No rash.  Neurological:     Mental Status: He is alert and oriented to person, place, and time.  Sensory: Sensation is intact. No sensory deficit.     Motor: Motor function is intact.  Psychiatric:        Mood  and Affect: Mood and affect normal.        Behavior: Behavior normal.          ED Treatments / Results  Labs (all labs ordered are listed, but only abnormal results are displayed) Labs Reviewed  URINALYSIS, ROUTINE W REFLEX MICROSCOPIC - Abnormal; Notable for the following components:      Result Value   Color, Urine BROWN (*)    APPearance TURBID (*)    Hgb urine dipstick MODERATE (*)    Bilirubin Urine SMALL (*)    Ketones, ur 15 (*)    Protein, ur 100 (*)    Leukocytes, UA LARGE (*)    All other components within normal limits  URINALYSIS, MICROSCOPIC (REFLEX) - Abnormal; Notable for the following components:   Bacteria, UA MANY (*)    All other components within normal limits  URINE CULTURE    EKG None  Radiology No results found.   R foot xray 07/13/18: Study Result  CLINICAL DATA:  Right 3rd toe chronic osteomyelitis.  EXAM: RIGHT FOOT COMPLETE - 3+ VIEW  COMPARISON:  07/31/2015  FINDINGS: Evidence of prior bony resorption of the distal phalanx of the right 3rd toe. No active bone destruction noted to suggest acute/active osteomyelitis. Hallux valgus deformity at the 1st MTP joint. No acute fracture, subluxation or dislocation.  IMPRESSION: Evidence of remote bone destruction/resorption of the right 3rd toe distal phalanx. No definite changes of acute osteomyelitis. If there is high clinical suspicion for acute osteomyelitis, this would be better evaluated with MRI.   Electronically Signed   By: Rolm Baptise M.D.   On: 07/13/2018 15:38     Procedures Procedures (including critical care time)  Medications Ordered in ED Medications  HYDROcodone-acetaminophen (NORCO/VICODIN) 5-325 MG per tablet 1 tablet (1 tablet Oral Given 07/25/18 1754)     Initial Impression / Assessment and Plan / ED Course  I have reviewed the triage vital signs and the nursing notes.  Pertinent labs & imaging results that were available during my care of the  patient were reviewed by me and considered in my medical decision making (see chart for details).     73 y.o. male here with worsening right third toe pain for about a week.  Has a history of chronic osteomyelitis in this toe, was previously advised to potentially undergo amputation but he did not, they treated conservatively and the ulcer healed.  He has been taking his home hydrocodone but has run out, and the pain is worsening.  He had an x-ray on 07/13/2018 which shows evidence of remote bone destruction/resorption but no acute osteomyelitis.  His PCP referred him back to his vascular surgeon Dr. Scot Dock for ongoing management of this. On exam, large callus to the plantar aspect of the R 3rd toe tip, no erythema or warmth, no drainage or fluctuance, no red streaking, diffuse TTP to the distal toe, limited ROM due to pain and due to chronic deformity. Discussed case with my attending Dr. Rogene Houston, who feels we don't need to do anything emergent, treat his pain but doubt need for further imaging or labs, and have him f/up with vascular surgery again to discuss possible amputation, doubt need for abx at this time for this, doubt need for emergent vascular surgery consultation. Of note, pt also with cloudy urine, will get U/A and UCx.  Will give pain meds and reassess after U/A results are back.   7:12 PM U/A with large leuks, no nitrites, >50 WBCs with many bacteria and no squamous cells, c/w UTI. Will treat for this. Will send home with short course of pain meds, but advised that he ultimately needs to f/up with his PCP for ongoing narcotic rx's. Advised f/up with vascular surgery regarding his toe, and with his PCP for f/up on the UTI in 1wk. Pt also wants rx's for miralax and colace to help with constipation related to his narcotic use; last BM today. No abd pain/tenderness. Doubt need for further work up of this. Rx's given despite this being over the counter. I explained the diagnosis and have given  explicit precautions to return to the ER including for any other new or worsening symptoms. The patient understands and accepts the medical plan as it's been dictated and I have answered their questions. Discharge instructions concerning home care and prescriptions have been given. The patient is STABLE and is discharged to home in good condition.    Final Clinical Impressions(s) / ED Diagnoses   Final diagnoses:  Chronic osteomyelitis of toe of right foot ([place])  Lower urinary tract infection  Toe pain, right    ED Discharge Orders         Ordered    HYDROcodone-acetaminophen (NORCO) 5-325 MG tablet  Every 6 hours PRN     07/25/18 1844    cephALEXin (KEFLEX) 500 MG capsule  2 times daily     07/25/18 1844    polyethylene glycol (MIRALAX / GLYCOLAX) packet  Daily     07/25/18 1912    docusate sodium (COLACE) 100 MG capsule  2 times daily PRN     07/25/18 [address], [place], [place] 07/25/18 1913    Fredia Sorrow, MD 08/04/18 [phone]

## 2018-07-25 NOTE — ED Provider Notes (Signed)
Medical screening examination/treatment/procedure(s) were conducted as a shared visit with non-physician practitioner(s) and myself.  I personally evaluated the patient during the encounter.  None  Patient seen by me along with the physician assistant.  Patient presents with a complaint of pain to his right third toe.  In the past has had osteomyelitis there.  Did have x-rays done by his primary care doctor January 27 which showed no evidence of acute osteo-but did show some remote chronic changes.  Patient known to have peripheral vascular disease.  Rest of patient's right foot is warm there is a faint posterior tibial pelvis.  No dorsalis pedis pulse.  Patient is normally on pain medication chronically for his hip.  He took some extra pain medicine to help with the increased toe pain and is now out of his pain medications.  Reviewing his primary care doctor's note they referred him to vascular surgery but patient and their discussion was a little reluctant on following up with them.  Patient apparently several years ago saw vascular surgery for the same toe problem and there was a discussion about possible amputation.  Based on her evaluation here there was some concern about urinary tract infections patient's urinalysis will be checked.  We treated as per those results.  Would would recommend for his the toe goes follow-up with vascular surgery will give him a referral to that.  Would not recommend any antibiotics for osteo-at this time.  No evidence of any wet gangrene picture.  Since x-rays of the foot were just on January 27 do not think we need to repeat them at this point in time.   Fredia Sorrow, MD 07/25/18 [phone]

## 2018-07-28 LAB — URINE CULTURE

## 2018-07-29 ENCOUNTER — Telehealth (HOSPITAL_COMMUNITY): Payer: Self-pay | Admitting: Pharmacist

## 2018-07-29 ENCOUNTER — Telehealth: Payer: Self-pay | Admitting: *Deleted

## 2018-07-29 NOTE — Telephone Encounter (Signed)
Post ED Visit - Positive Culture Follow-up: Successful Patient Follow-Up  Culture assessed and recommendations reviewed by:  []  Elenor Quinones, Pharm.D. []  Heide Guile, Pharm.D., BCPS AQ-ID []  Parks Neptune, Pharm.D., BCPS []  Alycia Rossetti, Pharm.D., BCPS []  [place], Pharm.D., BCPS, AAHIVP []  Legrand Como, Pharm.D., BCPS, AAHIVP []  Salome Arnt, PharmD, BCPS []  Johnnette Gourd, PharmD, BCPS [x]  Hughes Better, PharmD, BCPS []  Leeroy Cha, PharmD  Positive urine culture  []  Patient discharged without antimicrobial prescription and treatment is now indicated [x]  Organism is resistant to prescribed ED discharge antimicrobial []  Patient with positive blood cultures  Changes discussed with ED provider Lenn Sink, PA-C New antibiotic prescription Amoxicillin 500mg  PO BID x 7 days Called to [facility] [phone]  Contacted patient, date 07/29/2018, time [place], [place] 07/29/2018, 8:50 AM

## 2018-07-29 NOTE — Progress Notes (Signed)
ED Antimicrobial Stewardship Positive Culture Follow Up   [name] is an 73 y.o. male who presented to [facility] on (Not on file) with a chief complaint of No chief complaint on file.   Recent Results (from the past 720 hour(s))  Urine culture     Status: Abnormal   Collection Time: 07/25/18  5:28 PM  Result Value Ref Range Status   Specimen Description URINE, RANDOM  Final   Special Requests   Final    NONE Performed at [facility] [address] [place], [place] [address]    Culture >=100,000 COLONIES/mL ENTEROCOCCUS FAECALIS (A)  Final   Report Status 07/28/2018 FINAL  Final   Organism ID, Bacteria ENTEROCOCCUS FAECALIS (A)  Final      Susceptibility   Enterococcus faecalis - MIC*    AMPICILLIN <=2 SENSITIVE Sensitive     LEVOFLOXACIN 2 SENSITIVE Sensitive     NITROFURANTOIN <=16 SENSITIVE Sensitive     VANCOMYCIN 2 SENSITIVE Sensitive     * >=100,000 COLONIES/mL ENTEROCOCCUS FAECALIS    [x]  Treated with Cephalexin, organism resistant to prescribed antimicrobial []  Patient discharged originally without antimicrobial agent and treatment is now indicated  New antibiotic prescription: Amoxicillin 500 mg po BID x 7 days  ED Provider: Franne Forts, PA-C   MastersJake Church 07/29/2018, 8:22 AM  Clinical Pharmacist Monday - Friday phone -  [phone] Saturday - Sunday phone - [phone]

## 2018-08-16 ENCOUNTER — Encounter (HOSPITAL_COMMUNITY): Payer: Self-pay

## 2018-08-16 ENCOUNTER — Emergency Department (HOSPITAL_COMMUNITY): Payer: Medicare HMO

## 2018-08-16 ENCOUNTER — Other Ambulatory Visit: Payer: Self-pay

## 2018-08-16 ENCOUNTER — Emergency Department (HOSPITAL_COMMUNITY)
Admission: EM | Admit: 2018-08-16 | Discharge: 2018-08-16 | Disposition: A | Discharge status: Home / Self Care | Payer: Medicare HMO | Attending: Emergency Medicine | Admitting: Emergency Medicine

## 2018-08-16 DIAGNOSIS — R51 Headache: Secondary | ICD-10-CM | POA: Diagnosis not present

## 2018-08-16 DIAGNOSIS — E785 Hyperlipidemia, unspecified: Secondary | ICD-10-CM | POA: Diagnosis not present

## 2018-08-16 DIAGNOSIS — D696 Thrombocytopenia, unspecified: Secondary | ICD-10-CM | POA: Diagnosis not present

## 2018-08-16 DIAGNOSIS — R0902 Hypoxemia: Secondary | ICD-10-CM | POA: Diagnosis not present

## 2018-08-16 DIAGNOSIS — E876 Hypokalemia: Secondary | ICD-10-CM | POA: Diagnosis not present

## 2018-08-16 DIAGNOSIS — M436 Torticollis: Secondary | ICD-10-CM | POA: Diagnosis not present

## 2018-08-16 DIAGNOSIS — F17210 Nicotine dependence, cigarettes, uncomplicated: Secondary | ICD-10-CM | POA: Diagnosis not present

## 2018-08-16 DIAGNOSIS — Z79899 Other long term (current) drug therapy: Secondary | ICD-10-CM | POA: Diagnosis not present

## 2018-08-16 DIAGNOSIS — M62838 Other muscle spasm: Secondary | ICD-10-CM | POA: Diagnosis not present

## 2018-08-16 DIAGNOSIS — Z7982 Long term (current) use of aspirin: Secondary | ICD-10-CM | POA: Diagnosis not present

## 2018-08-16 DIAGNOSIS — M542 Cervicalgia: Secondary | ICD-10-CM | POA: Diagnosis present

## 2018-08-16 DIAGNOSIS — Z85118 Personal history of other malignant neoplasm of bronchus and lung: Secondary | ICD-10-CM | POA: Diagnosis not present

## 2018-08-16 DIAGNOSIS — I509 Heart failure, unspecified: Secondary | ICD-10-CM | POA: Diagnosis not present

## 2018-08-16 DIAGNOSIS — R519 Headache, unspecified: Secondary | ICD-10-CM

## 2018-08-16 DIAGNOSIS — I110 Hypertensive heart disease with heart failure: Secondary | ICD-10-CM | POA: Diagnosis not present

## 2018-08-16 DIAGNOSIS — I2510 Atherosclerotic heart disease of native coronary artery without angina pectoris: Secondary | ICD-10-CM | POA: Insufficient documentation

## 2018-08-16 DIAGNOSIS — I671 Cerebral aneurysm, nonruptured: Secondary | ICD-10-CM | POA: Diagnosis not present

## 2018-08-16 DIAGNOSIS — R52 Pain, unspecified: Secondary | ICD-10-CM | POA: Diagnosis not present

## 2018-08-16 DIAGNOSIS — I252 Old myocardial infarction: Secondary | ICD-10-CM | POA: Diagnosis not present

## 2018-08-16 DIAGNOSIS — I6523 Occlusion and stenosis of bilateral carotid arteries: Secondary | ICD-10-CM | POA: Diagnosis not present

## 2018-08-16 LAB — CBC WITH DIFFERENTIAL/PLATELET
Abs Immature Granulocytes: 0.01 K/uL (ref 0.00–0.07)
Basophils Absolute: 0 K/uL (ref 0.0–0.1)
Basophils Relative: 0 %
Eosinophils Absolute: 0 K/uL (ref 0.0–0.5)
Eosinophils Relative: 1 %
HCT: 43.3 % (ref 39.0–52.0)
Hemoglobin: 13.8 g/dL (ref 13.0–17.0)
Immature Granulocytes: 0 %
Lymphocytes Relative: 12 %
Lymphs Abs: 0.8 K/uL (ref 0.7–4.0)
MCH: 31.2 pg (ref 26.0–34.0)
MCHC: 31.9 g/dL (ref 30.0–36.0)
MCV: 98 fL (ref 80.0–100.0)
Monocytes Absolute: 0.4 K/uL (ref 0.1–1.0)
Monocytes Relative: 6 %
Neutro Abs: 5.3 K/uL (ref 1.7–7.7)
Neutrophils Relative %: 81 %
Platelets: 123 K/uL — ABNORMAL LOW (ref 150–400)
RBC: 4.42 MIL/uL (ref 4.22–5.81)
RDW: 14.1 % (ref 11.5–15.5)
WBC: 6.5 K/uL (ref 4.0–10.5)
nRBC: 0 % (ref 0.0–0.2)

## 2018-08-16 LAB — URINALYSIS, ROUTINE W REFLEX MICROSCOPIC
Bilirubin Urine: NEGATIVE
Glucose, UA: NEGATIVE mg/dL
Hgb urine dipstick: NEGATIVE
Ketones, ur: NEGATIVE mg/dL
Nitrite: NEGATIVE
Protein, ur: NEGATIVE mg/dL
Specific Gravity, Urine: 1.01 (ref 1.005–1.030)
pH: 7 (ref 5.0–8.0)

## 2018-08-16 LAB — BASIC METABOLIC PANEL
ANION GAP: 13 (ref 5–15)
BUN: 7 mg/dL — ABNORMAL LOW (ref 8–23)
CO2: 24 mmol/L (ref 22–32)
Calcium: 8.6 mg/dL — ABNORMAL LOW (ref 8.9–10.3)
Chloride: 97 mmol/L — ABNORMAL LOW (ref 98–111)
Creatinine, Ser: 0.95 mg/dL (ref 0.61–1.24)
GFR calc Af Amer: >60 mL/min (ref >60)
GFR calc non Af Amer: >60 mL/min (ref >60)
Glucose, Bld: 110 mg/dL — ABNORMAL HIGH (ref 70–99)
Potassium: 2.8 mmol/L — ABNORMAL LOW (ref 3.5–5.1)
Sodium: 134 mmol/L — ABNORMAL LOW (ref 135–145)

## 2018-08-16 LAB — I-STAT TROPONIN, ED: Troponin i, poc: 0.03 ng/mL (ref 0.00–0.08)

## 2018-08-16 MED ORDER — IOHEXOL 350 MG/ML SOLN
75.0000 mL | Freq: Once | INTRAVENOUS | Status: AC | PRN
  Administered 2018-08-16: 75 mL via INTRAVENOUS

## 2018-08-16 MED ORDER — CYCLOBENZAPRINE HCL 7.5 MG PO TABS: 7.5000 mg | ORAL_TABLET | Freq: Three times a day (TID) | ORAL | 0 refills | Status: DC | PRN

## 2018-08-16 MED ORDER — POTASSIUM CHLORIDE CRYS ER 20 MEQ PO TBCR
80.0000 meq | EXTENDED_RELEASE_TABLET | Freq: Once | ORAL | Status: AC
  Administered 2018-08-16: 80 meq via ORAL
  Filled 2018-08-16: qty 4

## 2018-08-16 MED ORDER — CYCLOBENZAPRINE HCL 5 MG PO TABS
7.5000 mg | ORAL_TABLET | Freq: Once | ORAL | Status: AC
  Administered 2018-08-16: 7.5 mg via ORAL
  Filled 2018-08-16: qty 1.5

## 2018-08-16 MED ORDER — MORPHINE SULFATE (PF) 4 MG/ML IV SOLN
4.0000 mg | Freq: Once | INTRAVENOUS | Status: AC
  Administered 2018-08-16: 4 mg via INTRAVENOUS
  Filled 2018-08-16: qty 1

## 2018-08-16 NOTE — ED Notes (Signed)
Unsuccessful IV attempt. Co worker will try.

## 2018-08-16 NOTE — ED Provider Notes (Signed)
[facility] EMERGENCY DEPARTMENT Provider Note   CSN: [id number] Arrival date & time: 08/16/18  1933    History   Chief Complaint Chief Complaint  Patient presents with  . Jaw Pain    HPI    [name] is a 73 y.o. male with a PMHx of AAA, CHF, gout, HTN, HLD, MI, DJD, and other conditions listed below, who presents to the ED with complaints of intermittent right-sided neck pain for the last 2 days.  Patient states that he thinks he pulled a muscle.  He describes his pain as 7/10 intermittent crampy right-sided neck pain that radiates into the right jaw and right side of his face, worse with movement, and unrelieved with hydrocodone.  He reports having a right-sided headache as well.  He also states that he feels fatigued and generally weak.  He denies any recent falls or injury, but states that he did some heavy lifting at work.  He denies any lightheadedness, vision changes, URI symptoms, cough, fevers, chills, chest pain, shortness breath, abdominal pain, nausea, vomiting, diarrhea, constipation, dysuria, hematuria, numbness, tingling, focal weakness, dental issues, facial swelling, or any other complaints at this time.  The history is provided by the patient and medical records. No language interpreter was used.    Past Medical History:  Diagnosis Date  . AAA (abdominal aortic aneurysm) (HCC)    noted CT 11/2012 size 3.3 x 3.2 cm  . Alcohol abuse   . Barretts esophagus    bx distal esophagus 2011 neg   . Bladder outlet obstruction 04/02/2018  . Cataract   . Cataract   . CHF (congestive heart failure) ([place])   . Cholelithiasis   . 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   2006 - 3 adenomas largest 12 mm  2008 - no polyps 2013-  2 mm cecal polyp, adenoma, routine repeat colonoscopy 02/2017 approx 05/14/2017 18 polyps max 12 mm ALL adenomas recall 2019 -  [name], MD, [facility]     . Diverticulosis of colon   . DJD (degenerative joint disease)    left hip    . Full dentures    upper and lower  . GERD (gastroesophageal reflux disease)    diet controlled   . Gout    feet  . Hiatal hernia    2 cm noted on upper endos 2011   . Hx of adenomatous colonic polyps   . Hyperlipidemia   . Hypertension   . Internal hemorrhoid   . 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 Myocardial infarction [facility]) 2004   hx of, 2 stents 2004. pt was on Plavix x 3 years then transitioned to ASA, no problems since  . Non-small cell carcinoma of right lung, stage 1 ([place]) 01/30/2016  . Renal failure, acute (HCC)    hx of 2/2 medication, resolved  . Tobacco abuse    smoker .5 ppd x 55 yrs  . UTI (lower urinary tract infection)     Patient Active Problem List   Diagnosis Date Noted  . Chronic osteomyelitis involving ankle and foot, right ([place]) 07/13/2018  . Prostate enlargement 04/02/2018  . Mild alcohol use disorder 12/09/2016  . Premature atrial contractions 07/31/2015  . Chronic use of opiate for therapeutic purpose 05/01/2015  . Atherosclerosis of aorta ([place]) 01/13/2015  . Prediabetes 12/30/2014  . AAA (abdominal aortic aneurysm) ([place])   . Healthcare maintenance 06/30/2012  . CAD (coronary artery disease) 03/17/2012  .  Erectile dysfunction 10/12/2010  . Arthropathy of pelvic region and thigh 05/02/2008  . 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  . Gout 04/18/2006  . Tobacco use disorder 04/18/2006  . Essential hypertension 04/18/2006  . GERD 04/18/2006    Past Surgical History:  Procedure Laterality Date  . cataract surgery Bilateral 2018   removal of cataracts  . COLONOSCOPY     hx polyps - [name] 02/2012  . CORONARY ANGIOPLASTY WITH STENT PLACEMENT    . flexible cystourethroscopy  05/24/06  . INGUINAL HERNIA REPAIR  2005  . LUNG LOBECTOMY Right 11/07   RUL  . MULTIPLE TOOTH EXTRACTIONS    . UPPER GASTROINTESTINAL ENDOSCOPY          Home Medications    Prior to Admission medications    Medication Sig Start Date End Date Taking? Authorizing Provider  albuterol (PROVENTIL HFA;VENTOLIN HFA) 108 (90 Base) MCG/ACT inhaler Inhale 2 puffs into the lungs every 6 (six) hours as needed for wheezing or shortness of breath. Patient not taking: Reported on 04/02/2018 06/02/17   Axel Filler, MD  allopurinol (ZYLOPRIM) 100 MG tablet Take 1 tablet (100 mg total) by mouth daily. 05/12/18   Axel Filler, MD  amLODipine (NORVASC) 10 MG tablet TAKE 1 TABLET EVERY DAY Patient taking differently: Take 10 mg by mouth daily.  01/21/18   Axel Filler, MD  aspirin EC 81 MG tablet Take 81 mg by mouth daily.    [provider]  docusate sodium (COLACE) 100 MG capsule Take 1 capsule (100 mg total) by mouth 2 (two) times daily as needed for mild constipation. 07/25/18   Cornelious Diven, [place], PA-C  HYDROcodone-acetaminophen (NORCO) 5-325 MG tablet Take 1 tablet by mouth every 6 (six) hours as needed for severe pain. 07/25/18   Tationa Stech, [place], PA-C  HYDROcodone-acetaminophen (NORCO) 7.5-325 MG tablet Take 1 tablet by mouth 2 (two) times daily as needed for moderate pain. 07/13/18   Axel Filler, MD  metoprolol tartrate (LOPRESSOR) 50 MG tablet Take 1 tablet (50 mg total) by mouth 2 (two) times daily. 05/12/18   Axel Filler, MD  Multiple Vitamin (MULTIVITAMIN) tablet Take 1 tablet by mouth daily.    [provider]  polyethylene glycol (MIRALAX / GLYCOLAX) packet Take 17 g by mouth daily. Take daily until you achieve soft daily bowel movements, then titrate dosing to continue having daily soft bowel movements 07/25/18   Imojean Yoshino, [place], [place]  rosuvastatin (CRESTOR) 20 MG tablet Take 1 tablet (20 mg total) by mouth daily. 05/27/18   Axel Filler, MD  tamsulosin (FLOMAX) 0.4 MG CAPS capsule Take 2 capsules (0.8 mg total) by mouth daily. 07/13/18   Axel Filler, MD    Family History Family History  Problem Relation Age of Onset  .  Diabetes Mother   . Hypertension Sister   . Heart disease Sister   . Diabetes Sister   . Arthritis Brother   . Gout Brother   . Colon cancer Neg Hx   . Esophageal cancer Neg Hx   . Rectal cancer Neg Hx   . Stomach cancer Neg Hx   . Colon polyps Neg Hx     Social History Social History   Tobacco Use  . Smoking status: Current Every Day Smoker    Packs/day: 0.50    Years: 55.00    Pack years: 27.50    Types: Cigarettes  . Smokeless tobacco: Never Used  Substance Use Topics  . Alcohol use: Yes  Alcohol/week: 0.0 standard drinks    Comment: drinking 1-2 pts gin/week, (mixed with tap water not tonic)  . Drug use: No     Allergies   Candesartan cilexetil-hctz; Hydrochlorothiazide; and Shellfish allergy   Review of Systems Review of Systems  Constitutional: Positive for fatigue. Negative for chills and fever.  HENT: Negative for dental problem, facial swelling, rhinorrhea and sore throat.   Eyes: Negative for visual disturbance.  Respiratory: Negative for cough and shortness of breath.   Cardiovascular: Negative for chest pain.  Gastrointestinal: Negative for abdominal pain, constipation, diarrhea, nausea and vomiting.  Genitourinary: Negative for dysuria and hematuria.  Musculoskeletal: Positive for myalgias and neck pain. Negative for arthralgias.  Skin: Negative for color change.  Allergic/Immunologic: Negative for immunocompromised state.  Neurological: Positive for headaches. Negative for weakness, light-headedness and numbness.  Psychiatric/Behavioral: Negative for confusion.   All other systems reviewed and are negative for acute change except as noted in the HPI.    Physical Exam Updated Vital Signs BP 135/68   Pulse 72   Temp (!) 97.4 F (36.3 C) (Oral)   Resp (!) 21   Ht 6\' 1"  (1.854 m)   Wt 88 kg   SpO2 94%   BMI 25.60 kg/m   Physical Exam Vitals signs and nursing note reviewed.  Constitutional:      General: He is not in acute distress.     Appearance: Normal appearance. He is well-developed. He is not toxic-appearing.     Comments: Afebrile, nontoxic, NAD  HENT:     Head: Normocephalic and atraumatic.     Comments: No tenderness to temples or jaw, no tenderness to face.  Eyes:     General: Vision grossly intact.        Right eye: No discharge.        Left eye: No discharge.     Extraocular Movements: Extraocular movements intact.     Conjunctiva/sclera: Conjunctivae normal.     Pupils: Pupils are equal, round, and reactive to light.     Comments: PERRL, EOMI, no nystagmus  Neck:     Musculoskeletal: Decreased range of motion. Torticollis and muscular tenderness present. No spinous process tenderness.     Comments: Limited ROM due to pain particularly with left lateral rotation, without spinous process TTP, no bony stepoffs or deformities, with moderate R paraspinous muscle TTP extending to the sternocleidomastoid musculature with +muscle spasms occurring after palpation to those muscles. No bruising or swelling.  Cardiovascular:     Rate and Rhythm: Normal rate and regular rhythm.     Pulses: Normal pulses.     Heart sounds: Normal heart sounds, S1 normal and S2 normal. No murmur. No friction rub. No gallop.   Pulmonary:     Effort: Pulmonary effort is normal. No respiratory distress.     Breath sounds: Normal breath sounds. No decreased breath sounds, wheezing, rhonchi or rales.  Abdominal:     General: Bowel sounds are normal. There is no distension.     Palpations: Abdomen is soft. Abdomen is not rigid.     Tenderness: There is no abdominal tenderness. There is no right CVA tenderness, left CVA tenderness, guarding or rebound. Negative signs include Murphy's sign and McBurney's sign.  Musculoskeletal:     Comments: MAE x4 Strength and sensation grossly intact in all extremities Distal pulses intact Gait steady C-spine as above No other spinal TTP  Skin:    General: Skin is warm and dry.     Findings: No rash.  Neurological:     General: No focal deficit present.     Mental Status: He is alert and oriented to person, place, and time.     GCS: GCS eye subscore is 4. GCS verbal subscore is 5. GCS motor subscore is 6.     Cranial Nerves: Cranial nerves are intact. No cranial nerve deficit.     Sensory: Sensation is intact. No sensory deficit.     Motor: Motor function is intact.     Coordination: Coordination is intact. Coordination normal.     Gait: Gait normal.     Comments: CN 2-12 grossly intact A&O x4 GCS 15 Sensation and strength intact Gait nonataxic including with tandem walking Coordination with finger-to-nose WNL Neg pronator drift   Psychiatric:        Mood and Affect: Mood and affect normal.        Behavior: Behavior normal.      ED Treatments / Results  Labs (all labs ordered are listed, but only abnormal results are displayed) Labs Reviewed  CBC WITH DIFFERENTIAL/PLATELET - Abnormal; Notable for the following components:      Result Value   Platelets 123 (*)    All other components within normal limits  BASIC METABOLIC PANEL - Abnormal; Notable for the following components:   Sodium 134 (*)    Potassium 2.8 (*)    Chloride 97 (*)    Glucose, Bld 110 (*)    BUN 7 (*)    Calcium 8.6 (*)    All other components within normal limits  URINALYSIS, ROUTINE W REFLEX MICROSCOPIC  I-STAT TROPONIN, ED    EKG EKG Interpretation  Date/Time:  Sunday August 16 2018 20:51:03 EST Ventricular Rate:  89 PR Interval:    QRS Duration: 142 QT Interval:  397 QTC Calculation: 484 R Axis:   -80 Text Interpretation:  Ectopic atrial rhythm RBBB and LAFB similar to prior 2/17 Confirmed by Butler, Michael ([id number]) on 08/16/2018 8:53:55 PM   Radiology No results found.  Procedures Procedures (including critical care time)  Medications Ordered in ED Medications  cyclobenzaprine (FLEXERIL) tablet 7.5 mg (has no administration in time range)  potassium chloride SA (K-DUR,KLOR-CON)  CR tablet 80 mEq (has no administration in time range)  morphine 4 MG/ML injection 4 mg (4 mg Intravenous Given 08/16/18 2120)     Initial Impression / Assessment and Plan / ED Course  I have reviewed the triage vital signs and the nursing notes.  Pertinent labs & imaging results that were available during my care of the patient were reviewed by me and considered in my medical decision making (see chart for details).        72  y.o. male here with intermittent R sided neck pain radiating to jaw and R head x2 days, also feels fatigued/generally weak. States he feels like it's a pulled muscle. Did some heavy lifting at work. On exam, moderate tenderness to R paracervical muscles and sternocleidomastoid musculature which causes spasm, no tenderness to temples or jaw/face, no midline spinal tenderness. No focal neuro deficits on exam, gait steady. Limited ROM of neck due to pain. Likely muscle strain/spasm, but DDx also includes dissection vs cervical spine etiology, etc. Will get basic labs, EKG, and CTA head/neck for further eval. Discussed case with my attending Dr. Melina Copa who agrees with plan.   9:44 PM CBC w/diff with chronic stable thrombocytopenia, otherwise WNL. BMP with K 2.8, will replete orally, this could be part of why he's having what seems to be  muscle spasms/torticollis. Trop neg. EKG similar to prior without acute changes. U/A and CT not yet done. Will reassess shortly.   9:56 PM Remainder of work up pending. Patient care to be resumed by Dr. Melina Copa at shift change sign-out. Patient history has been discussed with provider resuming care. Please see their notes for further documentation of pending results and dispo/care. Pt stable at sign-out and updated on transfer of care.    Final Clinical Impressions(s) / ED Diagnoses   Final diagnoses:  Neck pain on right side  Nonintractable headache, unspecified chronicity pattern, unspecified headache type  Torticollis  Muscle spasms of  neck  Thrombocytopenia [facility])  Hypokalemia    ED Discharge Orders    [address] [place], [place] 08/16/18 2156    Hayden Rasmussen, MD 09/02/18 1002

## 2018-08-16 NOTE — ED Notes (Signed)
Pt reminded of the need for urine.  

## 2018-08-16 NOTE — ED Notes (Signed)
Patient verbalizes understanding of discharge instructions. Opportunity for questioning and answers were provided. Armband removed by staff, pt discharged from ED.  

## 2018-08-16 NOTE — ED Triage Notes (Signed)
[facility] reports pt has intermittent neck and jaw pain since Friday. Hurts with movement. Pt tachy 110 RBB and  PVC.

## 2018-08-16 NOTE — Discharge Instructions (Signed)
You were seen in the emergency department for spasms on the right side of your neck.  You had blood work that showed your potassium was slightly low and this may be a cause of some muscle spasms.  You had a CAT scan of your head and neck that did not show a specific cause of your pain.  They did see a lot of narrowing in the blood vessels that will need to be followed up with your doctor.  Have included a report at the end of this to review with your doctor.  We are prescribing you some muscle relaxant to help with your neck spasms.  You can also try heating pad gently to the area.  Gentle stretching may also help.  Please follow-up with your doctor and return if any worsening symptoms.    1. Negative CTA with no acute vascular abnormality identified. No  evidence for dissection or large vessel occlusion.  2. 3 mm anterior communicating artery aneurysm.  3. Heavy carotid siphon atherosclerosis with resultant moderate to  severe multifocal narrowing, right slightly worse than left.  4. 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  5. Diffuse tortuosity of the major arterial vasculature of the neck,  suggesting chronic underlying hypertension.  6. Emphysema with superimposed 5 mm left upper lobe nodule,  indeterminate. No follow-up needed if patient is low-risk.  Non-contrast chest CT can be considered in 12 months if patient is  high-risk. This recommendation follows the consensus statement:  Guidelines for Management of Incidental Pulmonary Nodules Detected  on CT Images: From the Fleischner Society 2017; Radiology 2017;  284:228-243.

## 2018-08-20 ENCOUNTER — Encounter: Payer: Self-pay | Admitting: Internal Medicine

## 2018-10-26 ENCOUNTER — Other Ambulatory Visit: Payer: Self-pay

## 2018-10-26 DIAGNOSIS — Z79891 Long term (current) use of opiate analgesic: Secondary | ICD-10-CM

## 2018-10-26 DIAGNOSIS — M25551 Pain in right hip: Secondary | ICD-10-CM

## 2018-10-26 DIAGNOSIS — M25552 Pain in left hip: Secondary | ICD-10-CM

## 2018-10-26 NOTE — Telephone Encounter (Signed)
HYDROcodone-acetaminophen (NORCO) 5-325 MG tablet  HYDROcodone-acetaminophen (NORCO) 7.5-325 MG tablet, refill request @  [id number] - [place], [place] [address] [phone] (Phone) [phone] (Fax)

## 2018-10-27 MED ORDER — HYDROCODONE-ACETAMINOPHEN 7.5-325 MG PO TABS: 1.0000 | ORAL_TABLET | Freq: Two times a day (BID) | ORAL | 0 refills | Status: DC | PRN

## 2018-10-27 NOTE — Telephone Encounter (Signed)
Reviewed Chart.   Hydrocodone 5/325 Rx was from the ED.  Will need to be reminded not to fill Rx from other providers the next times he sees his PCP.    Would schedule with Dr. Evette Doffing in next 1-2 months if possible, either telehealth or in person based on needs.    Refill 1 month supply as Dr. Clayton Bibles is out on leave at this time.

## 2018-10-30 ENCOUNTER — Other Ambulatory Visit: Payer: Self-pay | Admitting: Internal Medicine

## 2018-10-30 DIAGNOSIS — I25118 Atherosclerotic heart disease of native coronary artery with other forms of angina pectoris: Secondary | ICD-10-CM

## 2018-11-16 ENCOUNTER — Ambulatory Visit (INDEPENDENT_AMBULATORY_CARE_PROVIDER_SITE_OTHER): Payer: Medicare HMO | Admitting: Student in an Organized Health Care Education/Training Program

## 2018-11-16 ENCOUNTER — Other Ambulatory Visit: Payer: Self-pay

## 2018-11-16 ENCOUNTER — Encounter: Payer: Self-pay | Admitting: Student in an Organized Health Care Education/Training Program

## 2018-11-16 VITALS — BP 100/58 | HR 78 | Temp 98.2°F | Ht 72.0 in | Wt 179.4 lb

## 2018-11-16 DIAGNOSIS — E871 Hypo-osmolality and hyponatremia: Secondary | ICD-10-CM

## 2018-11-16 DIAGNOSIS — G8929 Other chronic pain: Secondary | ICD-10-CM

## 2018-11-16 DIAGNOSIS — I998 Other disorder of circulatory system: Secondary | ICD-10-CM

## 2018-11-16 DIAGNOSIS — F1010 Alcohol abuse, uncomplicated: Secondary | ICD-10-CM

## 2018-11-16 DIAGNOSIS — Z7289 Other problems related to lifestyle: Secondary | ICD-10-CM | POA: Diagnosis not present

## 2018-11-16 DIAGNOSIS — Z79891 Long term (current) use of opiate analgesic: Secondary | ICD-10-CM | POA: Diagnosis not present

## 2018-11-16 DIAGNOSIS — E876 Hypokalemia: Secondary | ICD-10-CM

## 2018-11-16 DIAGNOSIS — M25551 Pain in right hip: Secondary | ICD-10-CM

## 2018-11-16 DIAGNOSIS — M1612 Unilateral primary osteoarthritis, left hip: Secondary | ICD-10-CM

## 2018-11-16 DIAGNOSIS — M25552 Pain in left hip: Secondary | ICD-10-CM

## 2018-11-16 DIAGNOSIS — M86671 Other chronic osteomyelitis, right ankle and foot: Secondary | ICD-10-CM

## 2018-11-16 MED ORDER — HYDROCODONE-ACETAMINOPHEN 7.5-325 MG PO TABS: 1.0000 | ORAL_TABLET | Freq: Two times a day (BID) | ORAL | 0 refills | Status: DC

## 2018-11-16 MED ORDER — HYDROCODONE-ACETAMINOPHEN 7.5-325 MG PO TABS: 1.0000 | ORAL_TABLET | Freq: Two times a day (BID) | ORAL | 0 refills | Status: DC | PRN

## 2018-11-16 NOTE — Patient Instructions (Signed)
Today we talked about your right hip pain.  We will continue with your chronic pain medications to help with your functioning.  We talked about your alcohol use, it is unsafe to use more than 2 drinks of alcohol while using this medication.  Please do your best to cut back.  Using alcohol with this medication increases your risk of falls at home.

## 2018-11-16 NOTE — Assessment & Plan Note (Addendum)
Chronic pain generator is osteoarthritis of the left hip.  Stable condition, 2 hydrocodone per day improves functional status.  Last U tox was 1 year ago and fine, we will recheck today.  I looked at the database which shows appropriate prescribing.  Plan is to refill 45-month supply of hydrocodone, follow-up for next refill.  There is some risk for fall given his advanced age, fragility, and mild alcohol use disorder.  We discussed this increased risk.  He feels that the hydrocodone is more beneficial to his quality of life and wants to continue.  I offered him alternatives.  He is going to think about his drinking and try to cut back.

## 2018-11-16 NOTE — Assessment & Plan Note (Signed)
Mild alcohol use disorder.  Drinking about 4 drinks per day.  No falls.  No signs of liver disease on exam or previous test.  We talked about cutting back drinking to no more than 2 drinks per day.  Ideally he should not drink any alcohol because he is also using hydrocodone chronically.  He acknowledged this.  Going to try to cut back on his drinking.  Unable to use naltrexone because of current use of chronic opioid.

## 2018-11-16 NOTE — Assessment & Plan Note (Signed)
Potassium on a BMP of 2.8 in March during the ED visit.  Unclear source at this hypokalemia, outlier, not present in previous BMPs.  Also hyponatremic.  Plan is to recheck BMP today to ensure resolution.

## 2018-11-16 NOTE — Assessment & Plan Note (Signed)
Chronic osteomyelitis of the right third toe, offered amputation by vascular surgery in 2019 but patient declined.  Clinically doing well with no significant pain, not function limiting.  Perfusion to the rest of the foot seems fine.  Continuing medical management with aspirin and rosuvastatin.  We will continue to follow clinically, may reconsider surgery if worsens.

## 2018-11-16 NOTE — Progress Notes (Signed)
   Assessment and Plan:  See Encounters tab for problem-based medical decision making.   __________________________________________________________  HPI:   73 year old man here for follow-up of chronic left hip pain.  He reports doing fairly well at home.  Using hydrocodone twice per day.  Denies any falls or other side effects.  Struggling right now with mental health and some depressed mood.  Feels isolated because of the COVID-19 restrictions.  Feels very sad right now because of the racial unrest in our community.  Reports having support at home, independent in his activities of daily living.  Does his own shopping, meal preparation.  Drinks alcohol every day, 4 drinks per day on average.  Denies other drug use.  Says he has a safe living environment.  No recent chest pain.  Pain in his left hip has been stable, improved with hydrocodone.  Reports good functional status.  Denies chest pain, no fevers or chills.  Reports pain is stable in his right third toe where he has chronic osteomyelitis due to ischemic artery disease.  __________________________________________________________  Problem List: Patient Active Problem List   Diagnosis Date Noted  . Chronic osteomyelitis involving ankle and foot, right ([place]) 07/13/2018    Priority: High  . Chronic use of opiate for therapeutic purpose 05/01/2015    Priority: High  . CAD (coronary artery disease) 03/17/2012    Priority: High  . Tobacco use disorder 04/18/2006    Priority: High  . Prostate enlargement 04/02/2018    Priority: Medium  . Mild alcohol use disorder 12/09/2016    Priority: Medium  . Atherosclerosis of aorta ([place]) 01/13/2015    Priority: Medium  . Prediabetes 12/30/2014    Priority: Medium  . AAA (abdominal aortic aneurysm) ([place])     Priority: Medium  . Essential hypertension 04/18/2006    Priority: Medium  . GERD 04/18/2006    Priority: Medium  . Premature atrial contractions 07/31/2015    Priority: Low  .  Healthcare maintenance 06/30/2012    Priority: Low  . Erectile dysfunction 10/12/2010    Priority: Low  . Arthropathy of pelvic region and thigh 05/02/2008    Priority: Low  . 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    Priority: Low  . Gout 04/18/2006    Priority: Low  . Hypokalemia 11/16/2018    Medications: Reconciled today in Epic __________________________________________________________  Physical Exam:  Vital Signs: Vitals:   11/16/18 0908  BP: (!) 100/58  Pulse: 78  Temp: 98.2 F (36.8 C)  TempSrc: Oral  SpO2: 97%  Weight: 179 lb 6.4 oz (81.4 kg)  Height: 6' (1.829 m)    Gen: Well appearing, NAD Neck: No cervical LAD, No thyromegaly or nodules, No JVD. CV: RRR, no murmurs Pulm: Normal effort, CTA throughout, no wheezing Abd: Soft, NT, ND, normal BS.  No organomegaly Ext: Warm, no edema, left hip with stiffness, decreased range of motion on internal rotation.  Right third toe has nail changes, thickening, darkening of the skin, no pain or ulcerations.

## 2018-11-17 ENCOUNTER — Encounter: Payer: Self-pay | Admitting: Student in an Organized Health Care Education/Training Program

## 2018-11-17 LAB — BMP8+ANION GAP
Anion Gap: 18 mmol/L (ref 10.0–18.0)
BUN/Creatinine Ratio: 14 (ref 10–24)
BUN: 18 mg/dL (ref 8–27)
CO2: 24 mmol/L (ref 20–29)
Calcium: 9.8 mg/dL (ref 8.6–10.2)
Chloride: 98 mmol/L (ref 96–106)
Creatinine, Ser: 1.31 mg/dL — ABNORMAL HIGH (ref 0.76–1.27)
GFR calc Af Amer: 62 mL/min/{1.73_m2} (ref >59)
GFR calc non Af Amer: 54 mL/min/{1.73_m2} — ABNORMAL LOW (ref >59)
Glucose: 129 mg/dL — ABNORMAL HIGH (ref 65–99)
Potassium: 3.7 mmol/L (ref 3.5–5.2)
Sodium: 140 mmol/L (ref 134–144)

## 2018-11-25 LAB — TOXASSURE SELECT,+ANTIDEPR,UR

## 2018-12-19 IMAGING — CT CT CHEST W/ CM
2 of 4 series · 13 of 36 positions shown, 16 images · IV contrast (iopamidol)
Comparison: Multiple priors, most recently chest CT 01/26/2016.

CLINICAL DATA: 70-year-old male with history of lung cancer
originally diagnosed in 3883. Followup study.

EXAM:
CT CHEST WITH CONTRAST
TECHNIQUE: Multidetector CT imaging of the chest was performed during
intravenous contrast administration.
CONTRAST:  75mL [id number] IOPAMIDOL (M0KSBN-7CC) INJECTION 61%

[Series 2: axial st · axial · 0.79mm/px · z∈[-286,-22]mm · 10 of 157 slices shown, 13 images]
[im 13/157  mediastinal]
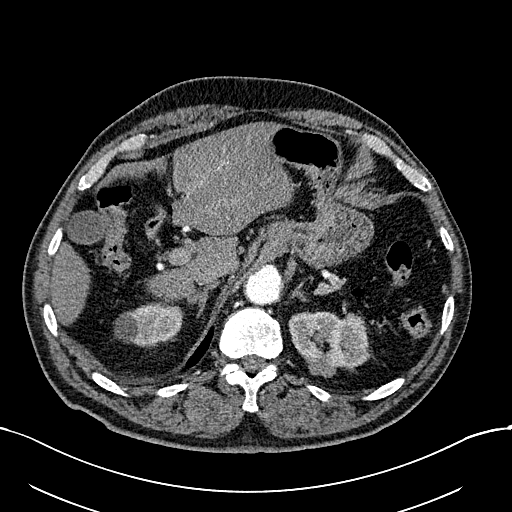
[im 13/157  lung]
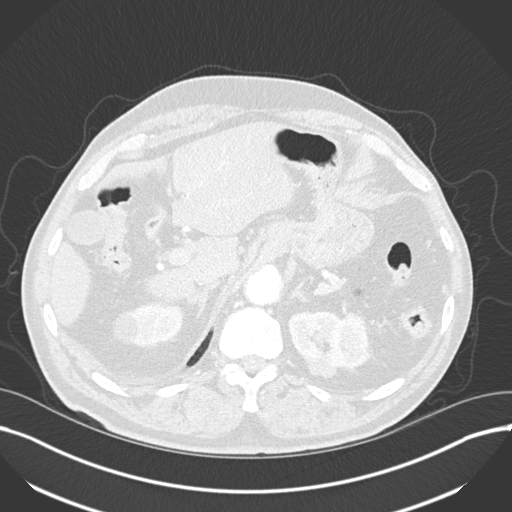
[im 25/157  lung]
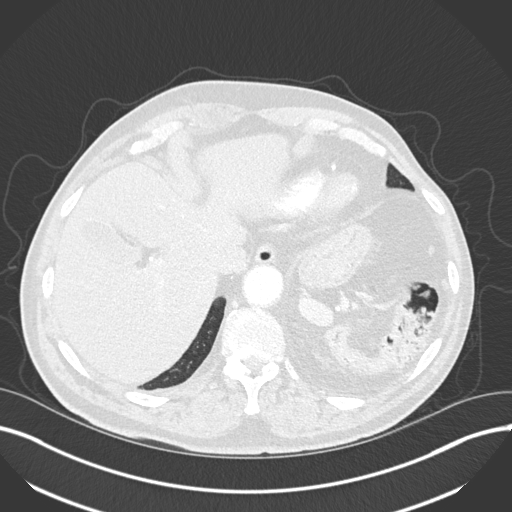
[im 49/157  lung]
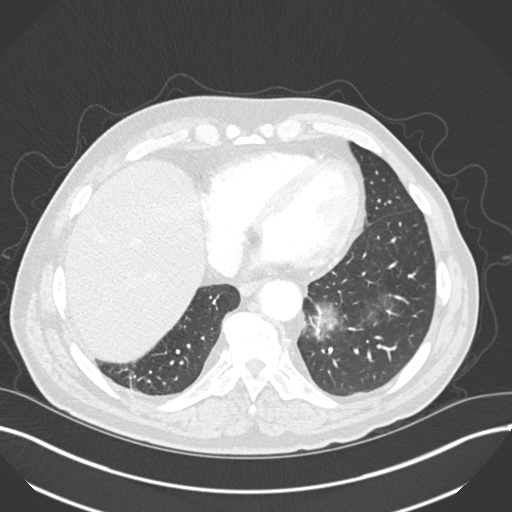
[im 61/157  lung]
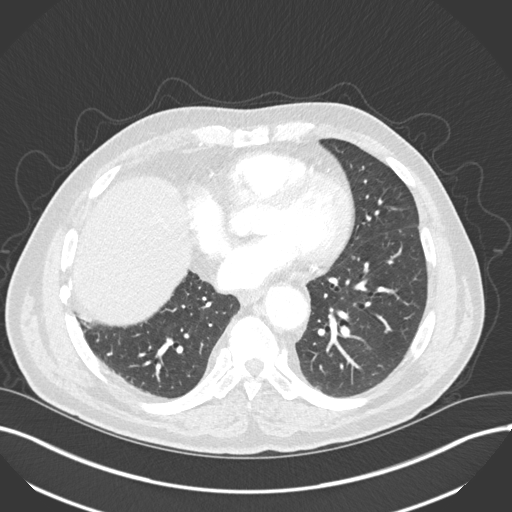
[im 73/157  mediastinal]
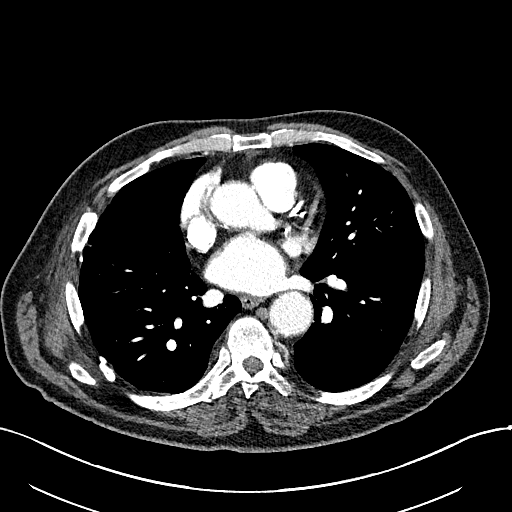
[im 73/157  lung]
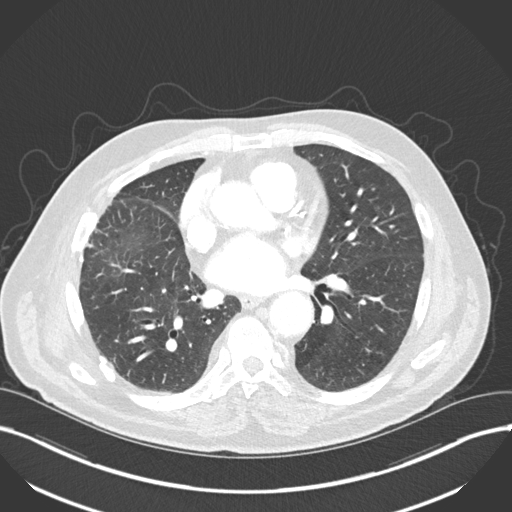
[im 85/157  lung]
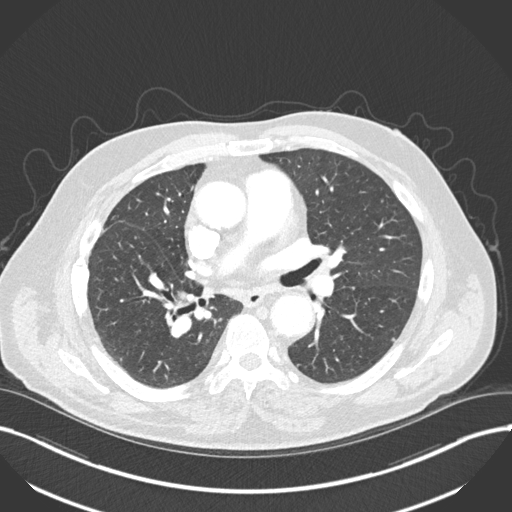
[im 97/157  lung]
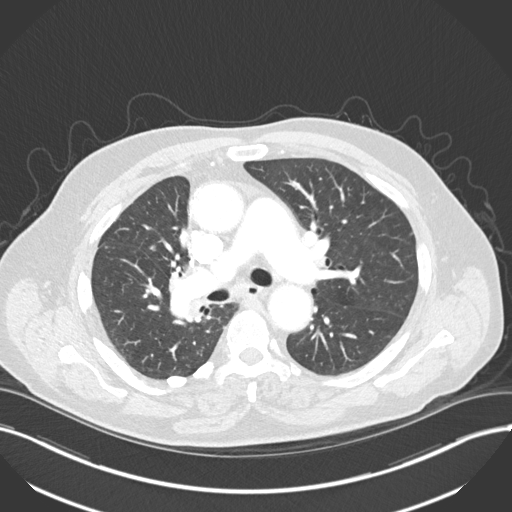
[im 121/157  lung]
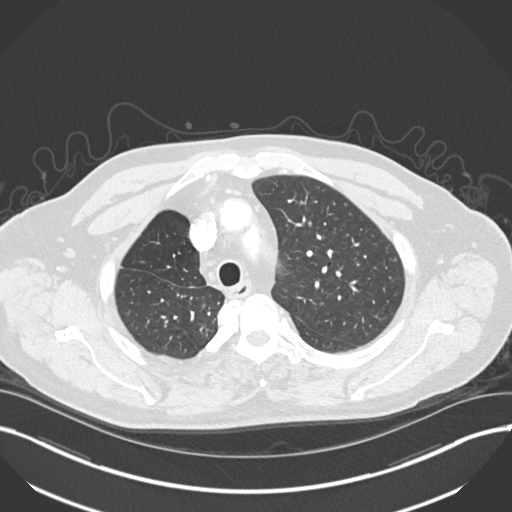
[im 133/157  mediastinal]
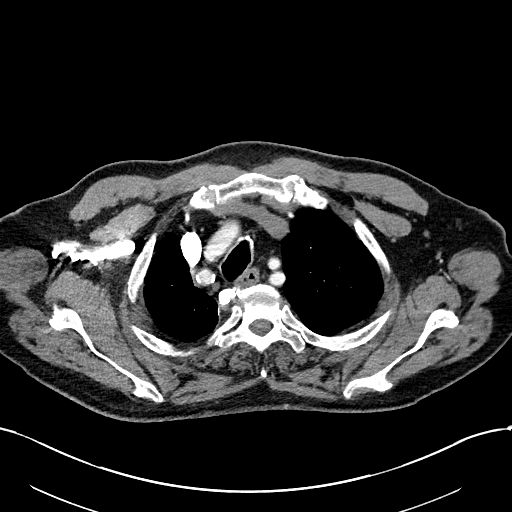
[im 133/157  lung]
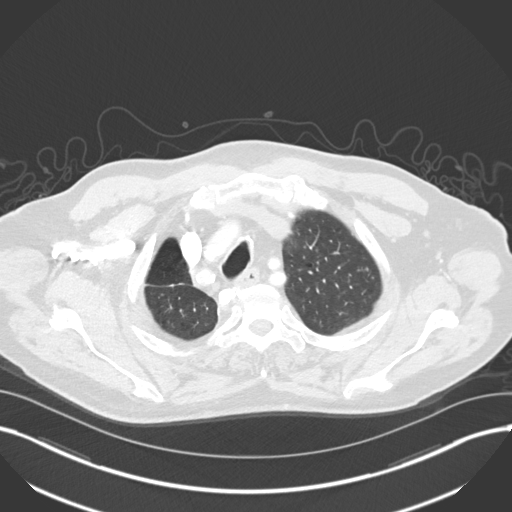
[im 145/157  lung]
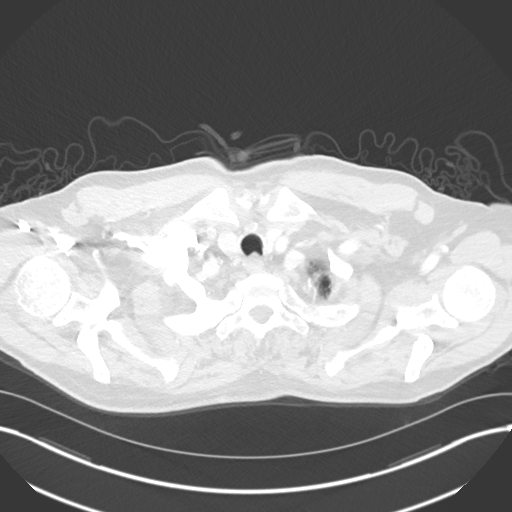

[Series 5: coronal · coronal · 0.69mm/px · 3 of 127 slices shown]
[im 26/127  lung]
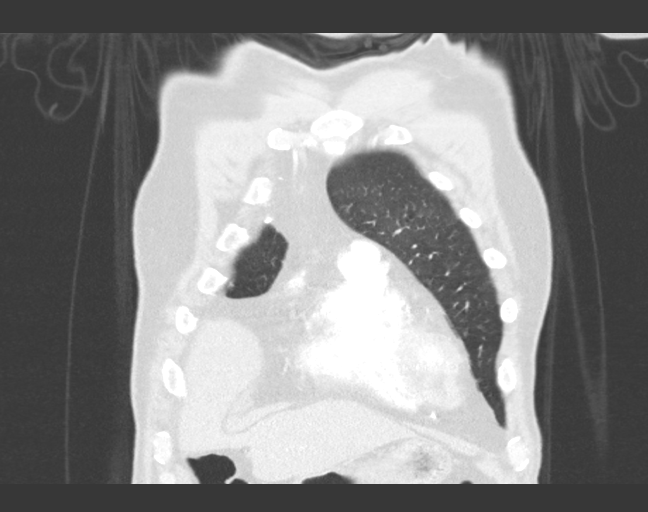
[im 51/127  lung]
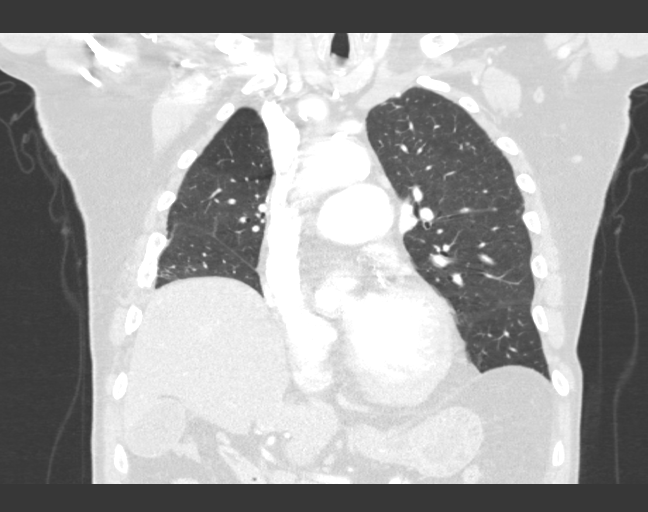
[im 76/127  lung]
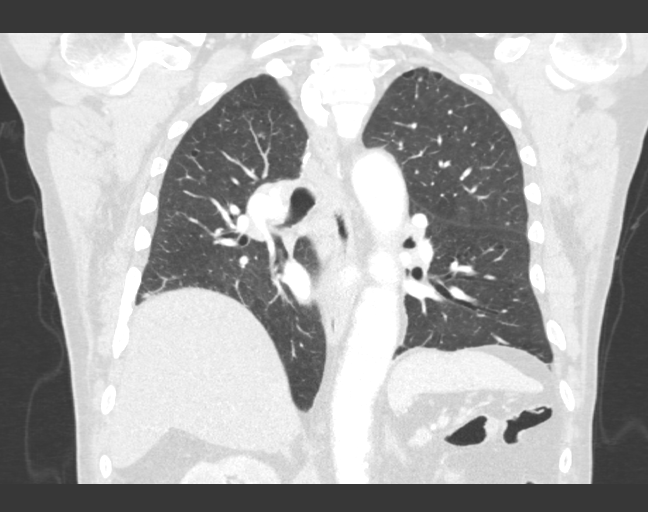

[13 of 36 positions shown; findings below may reference images not displayed]

FINDINGS: Cardiovascular: Heart size is normal. There is no significant
pericardial fluid, thickening or pericardial calcification. There is
aortic atherosclerosis, as well as atherosclerosis of the great
vessels of the mediastinum and the coronary arteries, including
calcified atherosclerotic plaque in the left main, left anterior
descending, left circumflex and right coronary arteries.

Mediastinum/Nodes: No pathologically enlarged mediastinal or hilar
lymph nodes. However, there are several prominent borderline
enlarged bilateral hilar lymph nodes which are nonspecific but
conspicuous. Esophagus is unremarkable in appearance. No axillary
lymphadenopathy.

Lungs/Pleura: Status post right upper lobectomy. Compensatory
hyperexpansion of the right middle and lower lobes. There has been
an interval increase in number and size of numerous pulmonary
nodules compared to the prior examination. The largest of these
nodules currently measures 7 x 5 mm (mean diameter of 6 mm) in the
right middle lobe (image 50 of series 7). No acute consolidative
airspace disease. No pleural effusions.

Upper Abdomen: Diffuse low attenuation throughout the visualized
hepatic parenchyma, compatible with hepatic steatosis. Calcified
gallstones in the neck of the gallbladder. No surrounding
inflammatory changes adjacent to the gallbladder to suggest an acute
cholecystitis at this time. Low-attenuation lesions in the right
kidney, compatible with simple cysts, measuring up to 4.5 cm. Small
exophytic intermediate attenuation lesions in the upper pole of the
left kidney, similar in size to the prior noncontrast CT examination
at which point these were both intermediate attenuation, likely to
represent proteinaceous cysts.

Musculoskeletal: Postthoracotomy changes in the right hemithorax.
There are no aggressive appearing lytic or blastic lesions noted in
the visualized portions of the skeleton.
IMPRESSION: 1. Interval increase in number and size of innumerable small
pulmonary nodules scattered throughout the lungs bilaterally,
concerning for developing metastatic disease. The possibility of
infectious nodules is not entirely excluded, however. The largest
nodule at this time has a mean diameter of only 6 mm in the right
middle lobe (image 50 of series 7). Non-contrast chest CT at 3-6
months is recommended. If the nodules are stable at time of repeat
CT, then future CT at 18-24 months (from today's scan) is considered
optional for low-risk patients, but is recommended for high-risk
patients. This recommendation follows the consensus statement:
Guidelines for Management of Incidental Pulmonary Nodules Detected
[DATE].
2. Aortic atherosclerosis, in addition to left main and 3 vessel
coronary artery disease. Please note that although the presence of
coronary artery calcium documents the presence of coronary artery
disease, the severity of this disease and any potential stenosis
cannot be assessed on this non-gated CT examination. Assessment for
potential risk factor modification, dietary therapy or pharmacologic
therapy may be warranted, if clinically indicated.
3. Hepatic steatosis.
4. Cholelithiasis.
5. Additional incidental findings, similar prior studies, as above.

## 2019-02-02 ENCOUNTER — Other Ambulatory Visit: Payer: Self-pay | Admitting: *Deleted

## 2019-02-02 DIAGNOSIS — I25118 Atherosclerotic heart disease of native coronary artery with other forms of angina pectoris: Secondary | ICD-10-CM

## 2019-02-03 MED ORDER — AMLODIPINE BESYLATE 10 MG PO TABS: 10.0000 mg | ORAL_TABLET | Freq: Every day | ORAL | 1 refills | Status: DC

## 2019-02-03 MED ORDER — METOPROLOL TARTRATE 50 MG PO TABS: 50.0000 mg | ORAL_TABLET | Freq: Two times a day (BID) | ORAL | 1 refills | Status: DC

## 2019-02-15 ENCOUNTER — Ambulatory Visit (INDEPENDENT_AMBULATORY_CARE_PROVIDER_SITE_OTHER): Payer: Medicare HMO | Admitting: Student in an Organized Health Care Education/Training Program

## 2019-02-15 ENCOUNTER — Encounter: Payer: Self-pay | Admitting: Student in an Organized Health Care Education/Training Program

## 2019-02-15 ENCOUNTER — Other Ambulatory Visit: Payer: Self-pay

## 2019-02-15 VITALS — BP 104/61 | HR 67 | Temp 98.2°F | Ht 72.0 in | Wt 177.9 lb

## 2019-02-15 DIAGNOSIS — Z79891 Long term (current) use of opiate analgesic: Secondary | ICD-10-CM

## 2019-02-15 DIAGNOSIS — K635 Polyp of colon: Secondary | ICD-10-CM

## 2019-02-15 DIAGNOSIS — M160 Bilateral primary osteoarthritis of hip: Secondary | ICD-10-CM

## 2019-02-15 DIAGNOSIS — I2510 Atherosclerotic heart disease of native coronary artery without angina pectoris: Secondary | ICD-10-CM | POA: Diagnosis not present

## 2019-02-15 DIAGNOSIS — Z720 Tobacco use: Secondary | ICD-10-CM | POA: Diagnosis not present

## 2019-02-15 DIAGNOSIS — M545 Low back pain: Secondary | ICD-10-CM

## 2019-02-15 DIAGNOSIS — Z7982 Long term (current) use of aspirin: Secondary | ICD-10-CM

## 2019-02-15 DIAGNOSIS — M25552 Pain in left hip: Secondary | ICD-10-CM

## 2019-02-15 DIAGNOSIS — Z86010 Personal history of colonic polyps: Secondary | ICD-10-CM

## 2019-02-15 DIAGNOSIS — R82998 Other abnormal findings in urine: Secondary | ICD-10-CM

## 2019-02-15 DIAGNOSIS — F17200 Nicotine dependence, unspecified, uncomplicated: Secondary | ICD-10-CM

## 2019-02-15 DIAGNOSIS — G8929 Other chronic pain: Secondary | ICD-10-CM

## 2019-02-15 DIAGNOSIS — Z79899 Other long term (current) drug therapy: Secondary | ICD-10-CM

## 2019-02-15 DIAGNOSIS — R3129 Other microscopic hematuria: Secondary | ICD-10-CM | POA: Insufficient documentation

## 2019-02-15 DIAGNOSIS — I252 Old myocardial infarction: Secondary | ICD-10-CM

## 2019-02-15 DIAGNOSIS — M25551 Pain in right hip: Secondary | ICD-10-CM

## 2019-02-15 LAB — POCT URINALYSIS DIPSTICK
Bilirubin, UA: NEGATIVE
Glucose, UA: NEGATIVE
Ketones, UA: NEGATIVE
Nitrite, UA: POSITIVE
Protein, UA: POSITIVE — AB
Spec Grav, UA: 1.02 (ref 1.010–1.025)
Urobilinogen, UA: 0.2 E.U./dL
pH, UA: 7.5 (ref 5.0–8.0)

## 2019-02-15 MED ORDER — HYDROCODONE-ACETAMINOPHEN 7.5-325 MG PO TABS: 1.0000 | ORAL_TABLET | Freq: Two times a day (BID) | ORAL | 0 refills | Status: DC | PRN

## 2019-02-15 NOTE — Assessment & Plan Note (Signed)
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  Good compliance with medications without red flag symptoms.  No side effects, no falls.  Last urine tox assure was appropriate.  Largest risk factor is the code use of alcohol.  Still finds a large benefit to chronic hydrocodone.  We will continue with 2 tabs daily.  I sent a 71-month supply of prescriptions to his pharmacy.

## 2019-02-15 NOTE — Patient Instructions (Signed)
Today we talked about risk of colon cancer.  At your last colonoscopy in 2018 they found over 18 polyps, which we talked about as being small possibly precancerous lesions.  Because there were so maybe we should repeat your colonoscopy sometime this fall.  I have sent another referral to Dr. [name].  They will call you to schedule this.  We talked about your use of tobacco, it is important to quit smoking cigarettes.  There is no safe number of cigarettes, especially in your case with a history of heart disease and lung cancer.  We talked about your chronic hip pain.  We will continue with the hydrocodone twice daily to help improve your functioning.  We talked about your dark urine and check a urinalysis today.

## 2019-02-15 NOTE — Assessment & Plan Note (Addendum)
Patient complains of occasional dark urine, however no dysuria.  Point-of-care urinalysis is positive for small blood, protein, positive for nitrites and leukocytes.  Plan is to send for urinalysis with microscopy and urine culture.  I think if there is bacteriuria I will treat for a cystitis and repeat the urinalysis.  Given his history of tobacco use and personal history of cancer he is at risk for a bladder malignancy.  If the microscopic hematuria does not resolve with an antibiotic regimen would refer to urology for cystoscopy.  Addendum:  Urinalysis with pyuria, microscopic hematuria, low level bacteria growth which I think is insignificant, not likely a true UTI. He has been evaluated by [facility] in the past for intermitted hematuria, had a CT scan to rule out stone but no cystoscopy. I advised follow up with [facility] again to reconsider cystoscopy. He is feeling better and doesn't want to go through another procedure right now. We will follow up in a few months, recheck urine, and discuss the need to rule out bladder tumor more.

## 2019-02-15 NOTE — Assessment & Plan Note (Signed)
Patient with a history of numerous polyps, last colonoscopy was November 2018 which had 18 polyps, all benign adenomas.  Given the high number of polyps he was due for colonoscopy 1 year later, but has not followed up yet.  We talked about the importance of repeating that endoscopy given the high degree of polyps that he had.  According to the telephone note he declined evaluation by genetics in 2018.  He is agreeable to doing another colonoscopy this fall.  I placed referral.  Would continue all his current medications through the colonoscopy including aspirin.

## 2019-02-15 NOTE — Assessment & Plan Note (Signed)
Tobacco use disorder still not yet controlled.  Smoking about 6 cigarettes/day.  We discussed no healthy level of smoking.  He has a history of lung cancer and ischemic vascular disease.  He declines other pharmacologic intervention.  Not yet ready to quit.

## 2019-02-15 NOTE — Progress Notes (Signed)
   Assessment and Plan:  See Encounters tab for problem-based medical decision making.   __________________________________________________________  HPI:   73 year old man here for follow-up of chronic hip pain.  He is doing okay at home.  He is taking the COVID-19 restrictions very seriously.  Says that he does not visit his friends anymore, does not really going places.  Gets out every day to walk his street, goes to the grocery store, other than that is staying isolated at home.  Says that his mood is okay, not feeling too lonely.  Family is doing well.  Smoking 6 cigarettes/day, still drinking alcohol.  Reports good benefit from using hydrocodone, uses it twice daily.  Has had no falls, no confusion.  No constipation.  He is able to walk several blocks without shortness of breath or chest pain.  Has had no recent illnesses, no fevers or chills.  No emergency department visits, no hospitalizations.  __________________________________________________________  Problem List: Patient Active Problem List   Diagnosis Date Noted  . Chronic osteomyelitis involving ankle and foot, right ([place]) 07/13/2018    Priority: High  . Chronic use of opiate for therapeutic purpose 05/01/2015    Priority: High  . CAD (coronary artery disease) 03/17/2012    Priority: High  . Tobacco use disorder 04/18/2006    Priority: High  . Prostate enlargement 04/02/2018    Priority: Medium  . Mild alcohol use disorder 12/09/2016    Priority: Medium  . Atherosclerosis of aorta ([place]) 01/13/2015    Priority: Medium  . Prediabetes 12/30/2014    Priority: Medium  . AAA (abdominal aortic aneurysm) ([place])     Priority: Medium  . Essential hypertension 04/18/2006    Priority: Medium  . GERD 04/18/2006    Priority: Medium  . Premature atrial contractions 07/31/2015    Priority: Low  . Healthcare maintenance 06/30/2012    Priority: Low  . Erectile dysfunction 10/12/2010    Priority: Low  . Arthropathy of pelvic  region and thigh 05/02/2008    Priority: Low  . 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    Priority: Low  . Gout 04/18/2006    Priority: Low  . Dark urine 02/15/2019    Medications: Reconciled today in Epic __________________________________________________________  Physical Exam:  Vital Signs: Vitals:   02/15/19 0934  BP: 104/61  Pulse: 67  Temp: 98.2 F (36.8 C)  TempSrc: Oral  SpO2: 99%  Weight: 177 lb 14.4 oz (80.7 kg)  Height: 6' (1.829 m)    Gen: Well appearing, NAD Neck: No cervical LAD, No thyromegaly or nodules, No JVD. CV: RRR, no murmurs Pulm: Normal effort, CTA throughout, no wheezing Ext: Warm, no edema, normal joints Skin: No atypical appearing moles.  Large dark plaque on his back, no overlying scale, no macules, papules, or vesicles.

## 2019-02-15 NOTE — Assessment & Plan Note (Signed)
History of acute MI in 2004, doing well now with good exertional capacity.  Can walk greater than 2 blocks.  Has no angina symptoms.  Plan is to continue medical therapy with aspirin 81 mg daily, rosuvastatin, metoprolol.  Blood pressure is well controlled.  Lipid panel in January looked great with an LDL of 51.  Largest risk factor right now is his ongoing tobacco use.

## 2019-02-16 LAB — URINALYSIS, COMPLETE
Bilirubin, UA: NEGATIVE
Glucose, UA: NEGATIVE
Ketones, UA: NEGATIVE
Nitrite, UA: POSITIVE — AB
Specific Gravity, UA: 1.014 (ref 1.005–1.030)
Urobilinogen, Ur: 0.2 mg/dL (ref 0.2–1.0)
pH, UA: >=9 — AB (ref 5.0–7.5)

## 2019-02-16 LAB — MICROSCOPIC EXAMINATION

## 2019-02-18 ENCOUNTER — Encounter: Payer: Self-pay | Admitting: Internal Medicine

## 2019-02-18 LAB — URINE CULTURE

## 2019-03-18 ENCOUNTER — Ambulatory Visit (AMBULATORY_SURGERY_CENTER): Payer: Self-pay | Admitting: *Deleted

## 2019-03-18 ENCOUNTER — Other Ambulatory Visit: Payer: Self-pay

## 2019-03-18 VITALS — Temp 96.9°F | Ht 73.0 in | Wt 180.0 lb

## 2019-03-18 DIAGNOSIS — Z86010 Personal history of colonic polyps: Secondary | ICD-10-CM

## 2019-03-18 MED ORDER — SUPREP BOWEL PREP KIT 17.5-3.13-1.6 GM/177ML PO SOLN: 1.0000 | Freq: Once | ORAL | 0 refills | Status: AC

## 2019-03-18 NOTE — Progress Notes (Signed)
No egg or soy allergy known to patient  No issues with past sedation with any surgeries  or procedures, no intubation problems  No diet pills per patient No home 02 use per patient  No blood thinners per patient  Pt denies issues with constipation  No A fib or A flutter  EMMI video sent to pt's e mail  Pt asked for a prescription prep as he has medicaid, medicare and [employer] and he states will pay for a prep - sent in suprep at his request - pt and I discussed his prep instructions several times in PV today-  Pt did verbalize understanding   Due to the COVID-19 pandemic we are asking patients to follow these guidelines. Please only bring one care partner. Please be aware that your care partner may wait in the car in the parking lot or if they feel like they will be too hot to wait in the car, they may wait in the lobby on the 4th floor. All care partners are required to wear a mask the entire time (we do not have any that we can provide them), they need to practice social distancing, and we will do a Covid check for all patient's and care partners when you arrive. Also we will check their temperature and your temperature. If the care partner waits in their car they need to stay in the parking lot the entire time and we will call them on their cell phone when the patient is ready for discharge so they can bring the car to the front of the building. Also all patient's will need to wear a mask into building.

## 2019-03-27 IMAGING — CT CT CHEST W/ CM
2 of 4 series · 15 of 36 positions shown, 18 images · IV contrast (iopamidol)
Comparison: CT 07/29/2016, CT 01/26/2016

CLINICAL DATA: Restaging lung cancer.  Diagnosis 7004.

EXAM:
CT CHEST WITH CONTRAST
TECHNIQUE: Multidetector CT imaging of the chest was performed during
intravenous contrast administration.
CONTRAST:  100mL [id number] IOPAMIDOL ([id number]) INJECTION 61%

[Series 2: axial st · axial · 0.75mm/px · z∈[+1325,+1601]mm · 12 of 162 slices shown, 15 images]
[im 12/162  mediastinal]
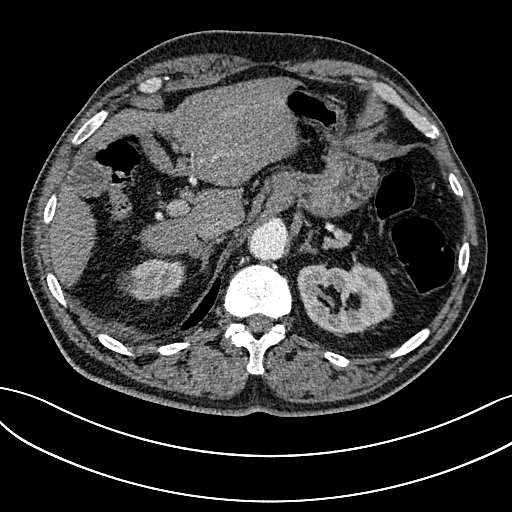
[im 12/162  lung]
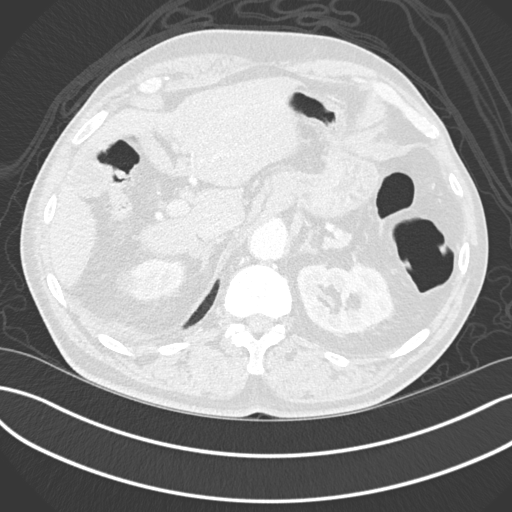
[im 24/162  lung]
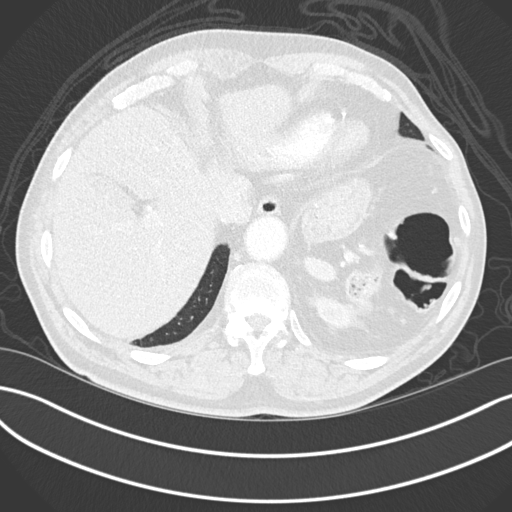
[im 35/162  lung]
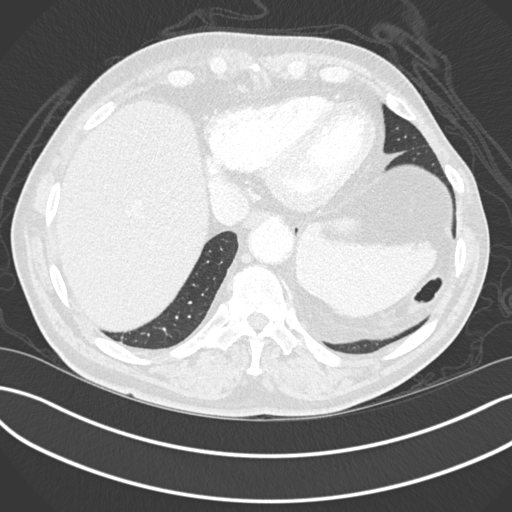
[im 47/162  lung]
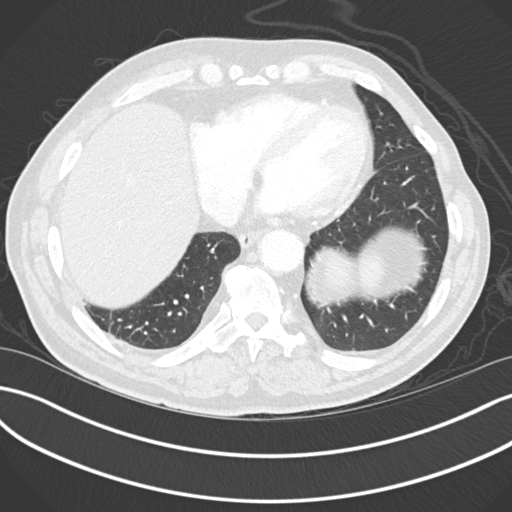
[im 58/162  mediastinal]
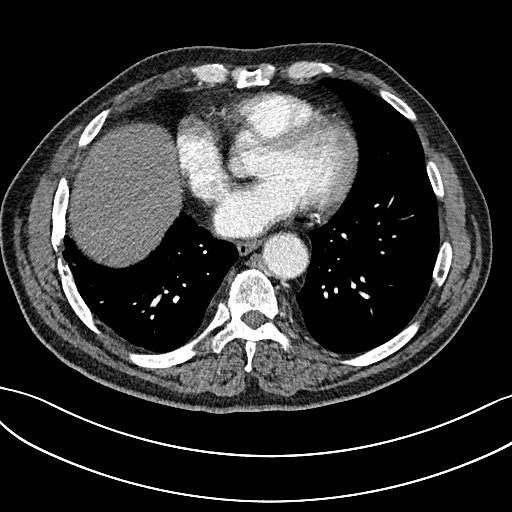
[im 58/162  lung]
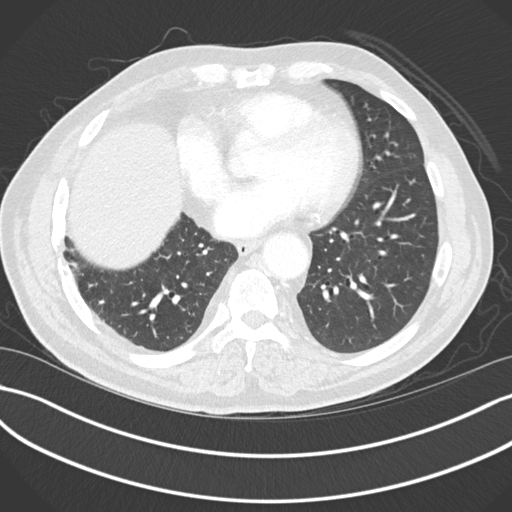
[im 70/162  lung]
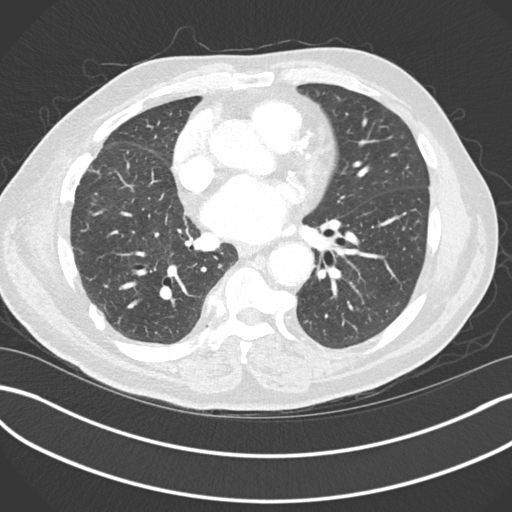
[im 93/162  lung]
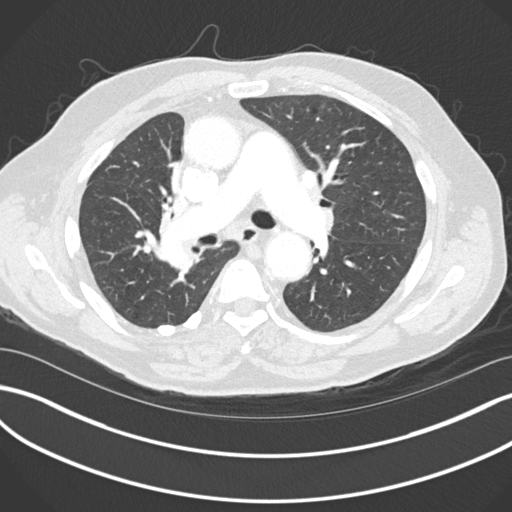
[im 104/162  lung]
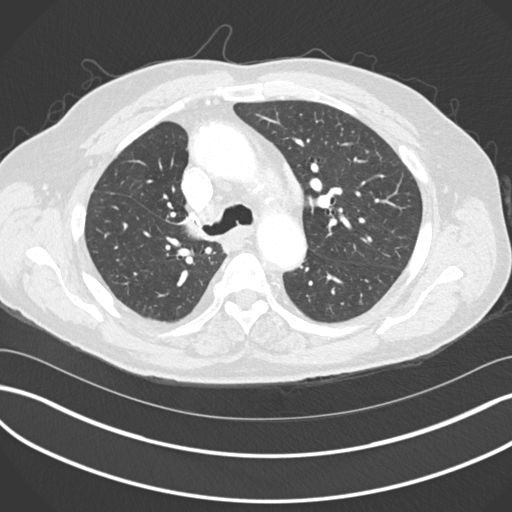
[im 116/162  mediastinal]
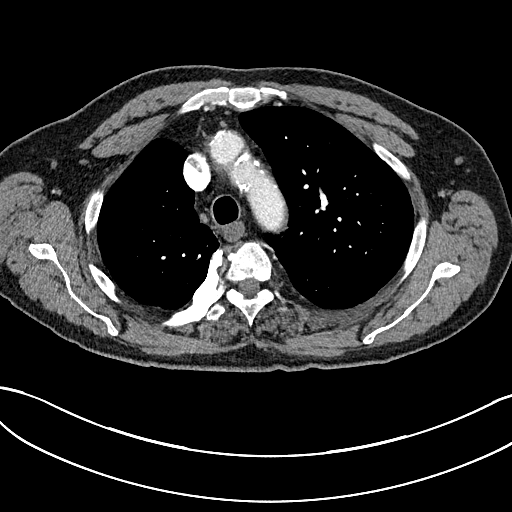
[im 116/162  lung]
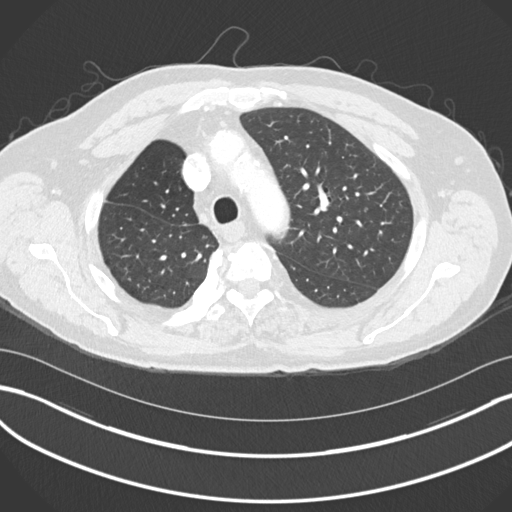
[im 127/162  lung]
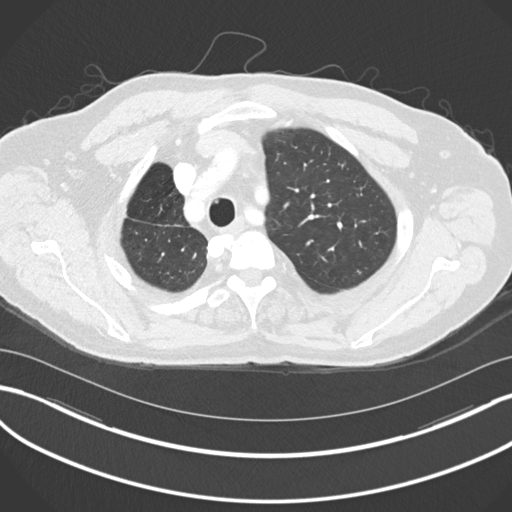
[im 139/162  lung]
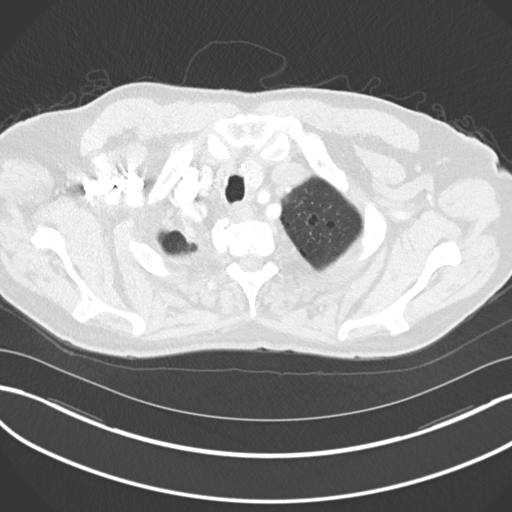
[im 150/162  lung]
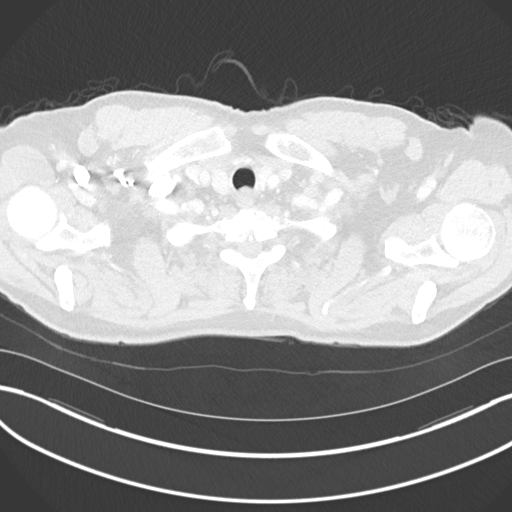

[Series 6: coronal · coronal · 0.74mm/px · 3 of 149 slices shown]
[im 30/149  lung]
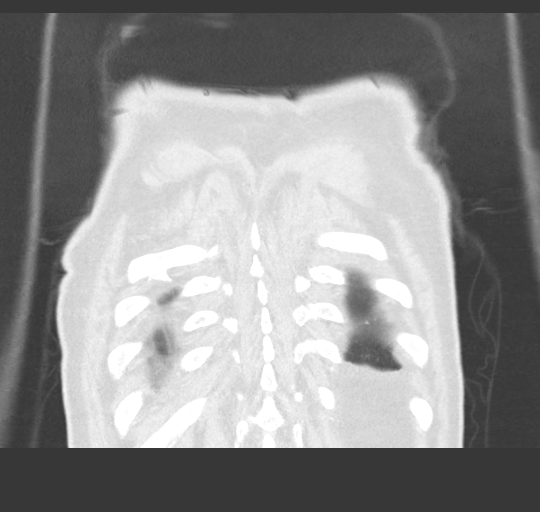
[im 60/149  lung]
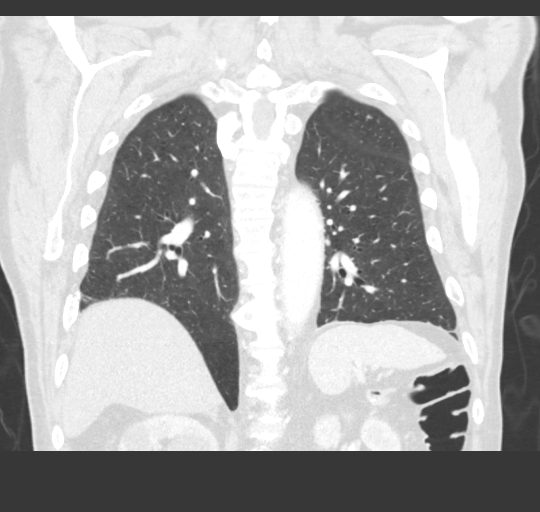
[im 89/149  lung]
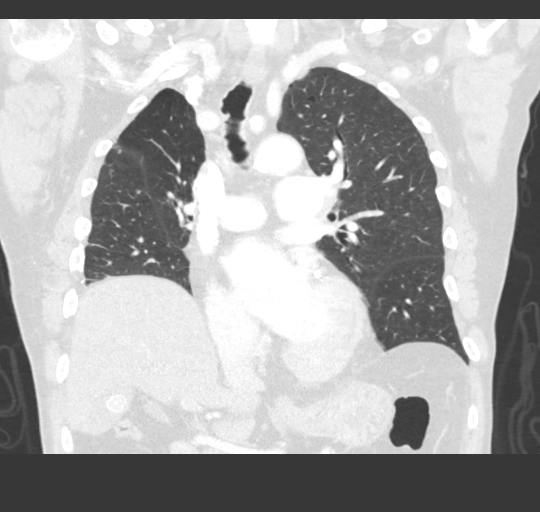

[15 of 36 positions shown; findings below may reference images not displayed]

FINDINGS: Cardiovascular: Coronary artery calcification and aortic
atherosclerotic calcification.

Mediastinum/Nodes: No axillary or supraclavicular adenopathy. Small
mediastinal lymph nodes are less than 10 mm and unchanged. More
prominent RIGHT hilar lymph nodes are not changed and less than 10
mm (image 73 and 65 series 2). No central pulmonary embolism.

Lungs/Pleura: 5 mm LEFT upper lobe pulmonary nodule (image 34,
series 5) is not seen on comparison exam. Nodule anterior to oblique
fissure LEFT upper lobe measuring 3 mm (image 77, series 5) is not
seen on prior.

In LEFT lower lobe subpleural nodule medially measuring 2 mm (image
73, series 5) is decreased from 5 mm. Nodule in the azygos
esophageal recess measuring 3 mm (image 92, series 5) compares to 2
mm. A previous 6 mm nodule in the lateral RIGHT lower lobe since
resolved.

Upper Abdomen: Stable high-density lesions in the posterior aspect
of the RIGHT kidney is likely a proteinaceous cyst (immune density
on comparison noncontrast CT close per. Low-attenuation lesions in
the RIGHT kidney. Adrenal glands normal.

Musculoskeletal: No aggressive osseous lesion.
IMPRESSION: 1. Waxing and waning sub 5 mm pulmonary nodules in the RIGHT and
LEFT lung. Recommend routine lung cancer surveillance.
2. Stable prominent lymph nodes in the RIGHT hilum.
3. Probable benign proteinaceous cyst of the LEFT kidney.

## 2019-03-31 ENCOUNTER — Telehealth: Payer: Self-pay

## 2019-03-31 NOTE — Telephone Encounter (Signed)
Covid-19 screening questions   Do you now or have you had a fever in the last 14 days? NO   Do you have any respiratory symptoms of shortness of breath or cough now or in the last 14 days? NO  Do you have any family members or close contacts with diagnosed or suspected Covid-19 in the past 14 days? NO  Have you been tested for Covid-19 and found to be positive? NO        

## 2019-04-01 ENCOUNTER — Encounter: Payer: Self-pay | Admitting: Internal Medicine

## 2019-04-01 ENCOUNTER — Other Ambulatory Visit: Payer: Self-pay

## 2019-04-01 ENCOUNTER — Ambulatory Visit (AMBULATORY_SURGERY_CENTER): Payer: Medicare HMO | Admitting: Internal Medicine

## 2019-04-01 VITALS — BP 122/68 | HR 59 | Temp 97.8°F | Resp 20 | Ht 73.0 in | Wt 180.0 lb

## 2019-04-01 DIAGNOSIS — D121 Benign neoplasm of appendix: Secondary | ICD-10-CM

## 2019-04-01 DIAGNOSIS — K635 Polyp of colon: Secondary | ICD-10-CM

## 2019-04-01 DIAGNOSIS — D122 Benign neoplasm of ascending colon: Secondary | ICD-10-CM | POA: Diagnosis not present

## 2019-04-01 DIAGNOSIS — Z86010 Personal history of colonic polyps: Secondary | ICD-10-CM

## 2019-04-01 DIAGNOSIS — D128 Benign neoplasm of rectum: Secondary | ICD-10-CM

## 2019-04-01 DIAGNOSIS — D124 Benign neoplasm of descending colon: Secondary | ICD-10-CM

## 2019-04-01 DIAGNOSIS — I509 Heart failure, unspecified: Secondary | ICD-10-CM | POA: Diagnosis not present

## 2019-04-01 MED ORDER — SODIUM CHLORIDE 0.9 % IV SOLN: 500.0000 mL | Freq: Once | INTRAVENOUS | Status: DC

## 2019-04-01 NOTE — Patient Instructions (Addendum)
I found and removed 4 small polyps.  No signs of cancer.  I will let you know pathology results and when to have another routine colonoscopy by mail and/or My Chart.  Remember - if you are able you can go to a drug store (pharmacy) to get your flu shot rather than waiting until November for doctor's appointment.  I appreciate the opportunity to care for you. [name], MD, FACG   YOU HAD AN ENDOSCOPIC PROCEDURE TODAY AT [facility]:   Refer to the procedure report that was given to you for any specific questions about what was found during the examination.  If the procedure report does not answer your questions, please call your gastroenterologist to clarify.  If you requested that your care partner not be given the details of your procedure findings, then the procedure report has been included in a sealed envelope for you to review at your convenience later.  **Handout on polyps**  YOU SHOULD EXPECT: Some feelings of bloating in the abdomen. Passage of more gas than usual.  Walking can help get rid of the air that was put into your GI tract during the procedure and reduce the bloating. If you had a lower endoscopy (such as a colonoscopy or flexible sigmoidoscopy) you may notice spotting of blood in your stool or on the toilet paper. If you underwent a bowel prep for your procedure, you may not have a normal bowel movement for a few days.  Please Note:  You might notice some irritation and congestion in your nose or some drainage.  This is from the oxygen used during your procedure.  There is no need for concern and it should clear up in a day or so.  SYMPTOMS TO REPORT IMMEDIATELY:   Following lower endoscopy (colonoscopy or flexible sigmoidoscopy):  Excessive amounts of blood in the stool  Significant tenderness or worsening of abdominal pains  Swelling of the abdomen that is new, acute  Fever of 100F or higher    For urgent or emergent issues, a  gastroenterologist can be reached at any hour by calling [phone].   DIET:  We do recommend a small meal at first, but then you may proceed to your regular diet.  Drink plenty of fluids but you should avoid alcoholic beverages for 24 hours.  ACTIVITY:  You should plan to take it easy for the rest of today and you should NOT DRIVE or use heavy machinery until tomorrow (because of the sedation medicines used during the test).    FOLLOW UP: Our staff will call the number listed on your records 48-72 hours following your procedure to check on you and address any questions or concerns that you may have regarding the information given to you following your procedure. If we do not reach you, we will leave a message.  We will attempt to reach you two times.  During this call, we will ask if you have developed any symptoms of COVID 19. If you develop any symptoms (ie: fever, flu-like symptoms, shortness of breath, cough etc.) before then, please call [phone].  If you test positive for Covid 19 in the 2 weeks post procedure, please call and report this information to [place].    If any biopsies were taken you will be contacted by phone or by letter within the next 1-3 weeks.  Please call us at [phone] if you have not heard about the biopsies in 3 weeks.    SIGNATURES/CONFIDENTIALITY: You and/or  your care partner have signed paperwork which will be entered into your electronic medical record.  These signatures attest to the fact that that the information above on your After Visit Summary has been reviewed and is understood.  Full responsibility of the confidentiality of this discharge information lies with you and/or your care-partner.

## 2019-04-01 NOTE — Op Note (Signed)
[facility] Patient Name: [name] Procedure Date: 04/01/2019 10:02 AM MRN: [id number] Endoscopist: [name] , MD Age: 73 Referring MD:  Date of Birth: May 23, 1946 Gender: Male Account #: [id number] Procedure:                Colonoscopy Indications:              Surveillance: History of numerous (> 10) adenomas                            on last colonoscopy (< 3 yrs) Medicines:                Propofol per Anesthesia, Monitored Anesthesia Care Procedure:                Pre-Anesthesia Assessment:                           - Prior to the procedure, a History and Physical                            was performed, and patient medications and                            allergies were reviewed. The patient's tolerance of                            previous anesthesia was also reviewed. The risks                            and benefits of the procedure and the sedation                            options and risks were discussed with the patient.                            All questions were answered, and informed consent                            was obtained. Prior Anticoagulants: The patient has                            taken no previous anticoagulant or antiplatelet                            agents. ASA Grade Assessment: III - A patient with                            severe systemic disease. After reviewing the risks                            and benefits, the patient was deemed in                            satisfactory condition to undergo the procedure.  After obtaining informed consent, the colonoscope                            was passed under direct vision. Throughout the                            procedure, the patient's blood pressure, pulse, and                            oxygen saturations were monitored continuously. The                            Colonoscope was introduced through the anus and   advanced to the the cecum, identified by                            appendiceal orifice and ileocecal valve. The                            colonoscopy was performed without difficulty. The                            patient tolerated the procedure well. The quality                            of the bowel preparation was good. The ileocecal                            valve, appendiceal orifice, and rectum were                            photographed. The bowel preparation used was                            Miralax via split dose instruction. Scope In: 10:05:50 AM Scope Out: 10:23:33 AM Scope Withdrawal Time: 0 hours 13 minutes 17 seconds  Total Procedure Duration: 0 hours 17 minutes 43 seconds  Findings:                 The perianal and digital rectal examinations were                            normal. Pertinent negatives include normal prostate                            (size, shape, and consistency).                           Four sessile polyps were found in the rectum,                            descending colon and ascending colon. The polyps                            were diminutive in size. These polyps  were removed                            with a cold snare. Resection and retrieval were                            complete. Verification of patient identification                            for the specimen was done. Estimated blood loss was                            minimal.                           The exam was otherwise without abnormality on                            direct and retroflexion views. Complications:            No immediate complications. Estimated Blood Loss:     Estimated blood loss was minimal. Impression:               - Four diminutive polyps in the rectum, in the                            descending colon and in the ascending colon,                            removed with a cold snare. Resected and retrieved.                           - The  examination was otherwise normal on direct                            and retroflexion views.                           - Personal history of colonic polyps. ATTENUATED                            COLONIC POLYPOSIS Recommendation:           - Patient has a contact number available for                            emergencies. The signs and symptoms of potential                            delayed complications were discussed with the                            patient. Return to normal activities tomorrow.                            Written discharge instructions were provided to the  patient.                           - Resume previous diet.                           - Continue present medications.                           - Repeat colonoscopy is recommended for                            surveillance. The colonoscopy date will be                            determined after pathology results from today's                            exam become available for review. [name], MD 04/01/2019 10:33:00 AM This report has been signed electronically.

## 2019-04-01 NOTE — Progress Notes (Signed)
Called to room to assist during endoscopic procedure.  Patient ID and intended procedure confirmed with present staff. Received instructions for my participation in the procedure from the performing physician.  

## 2019-04-01 NOTE — Progress Notes (Signed)
1024  Report given to PACU, vss

## 2019-04-05 ENCOUNTER — Telehealth: Payer: Self-pay

## 2019-04-05 NOTE — Telephone Encounter (Signed)
  Follow up Call-  Call back number 04/01/2019 05/14/2017  Post procedure Call Back phone  # [phone]  Permission to leave phone message Yes No  comments - no answering machine  Some recent data might be hidden     Patient questions:  Do you have a fever, pain , or abdominal swelling? No. Pain Score  0 *  Have you tolerated food without any problems? Yes.    Have you been able to return to your normal activities? Yes.    Do you have any questions about your discharge instructions: Diet   No. Medications  No. Follow up visit  No.  Do you have questions or concerns about your Care? No.  Actions: * If pain score is 4 or above: No action needed, pain <4.  1. Have you developed a fever since your procedure? no  2.   Have you had an respiratory symptoms (SOB or cough) since your procedure? no  3.   Have you tested positive for COVID 19 since your procedure no  4.   Have you had any family members/close contacts diagnosed with the COVID 19 since your procedure?  no   If yes to any of these questions please route to Joylene John, RN and Alphonsa Gin, Therapist, sports.

## 2019-04-14 ENCOUNTER — Encounter: Payer: Self-pay | Admitting: Internal Medicine

## 2019-04-14 NOTE — Progress Notes (Signed)
4 adenomas attenuated polyposis, recall 1 year

## 2019-04-27 ENCOUNTER — Other Ambulatory Visit: Payer: Self-pay

## 2019-04-27 NOTE — Telephone Encounter (Signed)
I called [facility] - stated pt has one more refill on Hydrocodone. Called pt - informed of refill.

## 2019-04-27 NOTE — Telephone Encounter (Signed)
HYDROcodone-acetaminophen (NORCO) 7.5-325 MG tablet, refill request @  [id number] [place], [place] [facility] [address] [phone] (Phone) [phone] (Fax)

## 2019-04-28 ENCOUNTER — Other Ambulatory Visit: Payer: Self-pay | Admitting: Student in an Organized Health Care Education/Training Program

## 2019-05-17 ENCOUNTER — Other Ambulatory Visit: Payer: Self-pay

## 2019-05-17 ENCOUNTER — Encounter: Payer: Self-pay | Admitting: Student in an Organized Health Care Education/Training Program

## 2019-05-17 ENCOUNTER — Ambulatory Visit (INDEPENDENT_AMBULATORY_CARE_PROVIDER_SITE_OTHER): Payer: Medicare HMO | Admitting: Student in an Organized Health Care Education/Training Program

## 2019-05-17 VITALS — BP 108/62 | HR 66 | Temp 98.4°F | Wt 176.9 lb

## 2019-05-17 DIAGNOSIS — Z79899 Other long term (current) drug therapy: Secondary | ICD-10-CM | POA: Diagnosis not present

## 2019-05-17 DIAGNOSIS — M160 Bilateral primary osteoarthritis of hip: Secondary | ICD-10-CM

## 2019-05-17 DIAGNOSIS — Z720 Tobacco use: Secondary | ICD-10-CM | POA: Diagnosis not present

## 2019-05-17 DIAGNOSIS — Z23 Encounter for immunization: Secondary | ICD-10-CM | POA: Diagnosis not present

## 2019-05-17 DIAGNOSIS — Z86010 Personal history of colonic polyps: Secondary | ICD-10-CM

## 2019-05-17 DIAGNOSIS — M25551 Pain in right hip: Secondary | ICD-10-CM

## 2019-05-17 DIAGNOSIS — G8929 Other chronic pain: Secondary | ICD-10-CM

## 2019-05-17 DIAGNOSIS — K635 Polyp of colon: Secondary | ICD-10-CM

## 2019-05-17 DIAGNOSIS — R3129 Other microscopic hematuria: Secondary | ICD-10-CM | POA: Diagnosis not present

## 2019-05-17 DIAGNOSIS — Z79891 Long term (current) use of opiate analgesic: Secondary | ICD-10-CM | POA: Diagnosis not present

## 2019-05-17 DIAGNOSIS — I10 Essential (primary) hypertension: Secondary | ICD-10-CM

## 2019-05-17 MED ORDER — HYDROCODONE-ACETAMINOPHEN 7.5-325 MG PO TABS: 1.0000 | ORAL_TABLET | Freq: Two times a day (BID) | ORAL | 0 refills | Status: DC | PRN

## 2019-05-17 NOTE — Assessment & Plan Note (Signed)
Blood pressure well controlled today.  Plan to continue with amlodipine 10 mg daily.

## 2019-05-17 NOTE — Assessment & Plan Note (Signed)
No further gross hematuria.  He reports he has had regular follow-up with urology clinic.  Continues to use tobacco that he does understand that this increases his risk for various malignancies including bladder cancer.

## 2019-05-17 NOTE — Assessment & Plan Note (Signed)
Hip osteoarthritis and low back pain on his chronic pain generators.  Functionally doing very well, reports good benefit with Norco twice daily.  Has had no adverse side effects.  Planning to continue with this medication indefinitely.  Tox assure in June was appropriate.  I cannot review the controlled database today consisting of site is under maintenance, but it has consistently been appropriate in the past.  He wants to change his pharmacy to the [facility] mail order pharmacy.  I am surprised that the are offering this service for controlled substances, but he says that they are willing them to mail them.  Will prescribe 62-month supply of Norco to home now mail-order pharmacy and update his controlled contract with that new information.

## 2019-05-17 NOTE — Assessment & Plan Note (Signed)
Patient had colonoscopy in October, tolerated the procedure well.  4 adenomas were retrieved.  Given his history of polyposis, GI service has recommended repeat colonoscopy in 2022.  Patient is agreeable.  Health maintenance tab has been updated.

## 2019-05-17 NOTE — Progress Notes (Signed)
   Assessment and Plan:  See Encounters tab for problem-based medical decision making.   __________________________________________________________  HPI:   73 year old man here for follow-up of chronic pain due to osteoarthritis in his bilateral hips.  Patient is functionally doing well at home.  Pain is well controlled, he finds functional benefit with using Norco 7.5 mg twice daily.  He reports a good functional status, walked here from the front of the hospital without difficulty.  Has had no chest pain or shortness of breath.  No fevers or chills or other recent illness.  No recent hospitalizations, ED visits, or urgent care visits.  He is compliant with social distancing guidelines.  Continues to use tobacco on a daily basis, not ready to quit.  Uses alcohol on almost a daily basis, but no worse than usual.  Reports compliance with all his other medications without adverse side effects.  __________________________________________________________  Problem List: Patient Active Problem List   Diagnosis Date Noted  . Chronic osteomyelitis involving ankle and foot, right ([place]) 07/13/2018    Priority: High  . Chronic use of opiate for therapeutic purpose 05/01/2015    Priority: High  . CAD (coronary artery disease) 03/17/2012    Priority: High  . Tobacco use disorder 04/18/2006    Priority: High  . Prostate enlargement 04/02/2018    Priority: Medium  . Mild alcohol use disorder 12/09/2016    Priority: Medium  . Atherosclerosis of aorta ([place]) 01/13/2015    Priority: Medium  . Prediabetes 12/30/2014    Priority: Medium  . AAA (abdominal aortic aneurysm) ([place])     Priority: Medium  . Essential hypertension 04/18/2006    Priority: Medium  . GERD 04/18/2006    Priority: Medium  . Premature atrial contractions 07/31/2015    Priority: Low  . Healthcare maintenance 06/30/2012    Priority: Low  . Erectile dysfunction 10/12/2010    Priority: Low  . Arthropathy of pelvic region and  thigh 05/02/2008    Priority: Low  . 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    Priority: Low  . Gout 04/18/2006    Priority: Low  . Microscopic hematuria 02/15/2019    Medications: Reconciled today in Epic __________________________________________________________  Physical Exam:  Vital Signs: Vitals:   05/17/19 0959  BP: 108/62  Pulse: 66  Temp: 98.4 F (36.9 C)  TempSrc: Oral  SpO2: 98%  Weight: 176 lb 14.4 oz (80.2 kg)    Gen: Well appearing, NAD CV: RRR, no murmurs Pulm: Normal effort, CTA throughout, no wheezing Ext: Warm, no edema, normal joints Psych: Normal affect, not depressed or anxious appearing

## 2019-06-01 ENCOUNTER — Other Ambulatory Visit: Payer: Self-pay | Admitting: Student in an Organized Health Care Education/Training Program

## 2019-06-14 ENCOUNTER — Telehealth: Payer: Self-pay | Admitting: Student in an Organized Health Care Education/Training Program

## 2019-06-14 NOTE — Telephone Encounter (Signed)
Needs refill on HYDROcodone-acetaminophen (NORCO) 7.5-325 MG tablet  ;pt contact [place], [place]

## 2019-06-14 NOTE — Telephone Encounter (Signed)
Pt called and notified he has 2 RF's available with [employer] and to call Insurance for refill.   SChaplin, RN,BSN

## 2019-08-02 ENCOUNTER — Other Ambulatory Visit: Payer: Self-pay | Admitting: Student in an Organized Health Care Education/Training Program

## 2019-08-02 DIAGNOSIS — I25118 Atherosclerotic heart disease of native coronary artery with other forms of angina pectoris: Secondary | ICD-10-CM

## 2019-08-09 ENCOUNTER — Encounter: Payer: Self-pay | Admitting: Student in an Organized Health Care Education/Training Program

## 2019-08-09 ENCOUNTER — Other Ambulatory Visit: Payer: Self-pay

## 2019-08-09 ENCOUNTER — Ambulatory Visit (INDEPENDENT_AMBULATORY_CARE_PROVIDER_SITE_OTHER): Payer: Medicare HMO | Admitting: Student in an Organized Health Care Education/Training Program

## 2019-08-09 VITALS — BP 107/60 | HR 64 | Temp 98.2°F | Ht 72.0 in | Wt 170.9 lb

## 2019-08-09 DIAGNOSIS — M25559 Pain in unspecified hip: Secondary | ICD-10-CM | POA: Diagnosis not present

## 2019-08-09 DIAGNOSIS — R7303 Prediabetes: Secondary | ICD-10-CM | POA: Diagnosis not present

## 2019-08-09 DIAGNOSIS — Z720 Tobacco use: Secondary | ICD-10-CM | POA: Diagnosis not present

## 2019-08-09 DIAGNOSIS — Z79899 Other long term (current) drug therapy: Secondary | ICD-10-CM

## 2019-08-09 DIAGNOSIS — Z79891 Long term (current) use of opiate analgesic: Secondary | ICD-10-CM

## 2019-08-09 DIAGNOSIS — Z7982 Long term (current) use of aspirin: Secondary | ICD-10-CM

## 2019-08-09 DIAGNOSIS — G8929 Other chronic pain: Secondary | ICD-10-CM | POA: Diagnosis not present

## 2019-08-09 DIAGNOSIS — I129 Hypertensive chronic kidney disease with stage 1 through stage 4 chronic kidney disease, or unspecified chronic kidney disease: Secondary | ICD-10-CM

## 2019-08-09 DIAGNOSIS — I2510 Atherosclerotic heart disease of native coronary artery without angina pectoris: Secondary | ICD-10-CM | POA: Diagnosis not present

## 2019-08-09 DIAGNOSIS — M25552 Pain in left hip: Secondary | ICD-10-CM

## 2019-08-09 DIAGNOSIS — N182 Chronic kidney disease, stage 2 (mild): Secondary | ICD-10-CM | POA: Diagnosis not present

## 2019-08-09 DIAGNOSIS — M25551 Pain in right hip: Secondary | ICD-10-CM

## 2019-08-09 LAB — POCT GLYCOSYLATED HEMOGLOBIN (HGB A1C): Hemoglobin A1C: 5.6 % (ref 4.0–5.6)

## 2019-08-09 LAB — GLUCOSE, CAPILLARY: Glucose-Capillary: 102 mg/dL — ABNORMAL HIGH (ref 70–99)

## 2019-08-09 MED ORDER — TAMSULOSIN HCL 0.4 MG PO CAPS: 0.8000 mg | ORAL_CAPSULE | Freq: Every evening | ORAL | 3 refills | Status: DC

## 2019-08-09 MED ORDER — HYDROCODONE-ACETAMINOPHEN 7.5-325 MG PO TABS: 1.0000 | ORAL_TABLET | Freq: Two times a day (BID) | ORAL | 0 refills | Status: DC | PRN

## 2019-08-09 MED ORDER — METOPROLOL SUCCINATE ER 100 MG PO TB24: 100.0000 mg | ORAL_TABLET | Freq: Every day | ORAL | 3 refills | Status: DC

## 2019-08-09 NOTE — Assessment & Plan Note (Signed)
Patient with a history of coronary artery disease and ischemic events in 2004.  Biggest risk factor for future ischemic events is his ongoing tobacco use.  We talked again about the importance of cessation today but he is not interested.  We are managing medically with aspirin 81 daily, rosuvastatin 20 mg daily, and metoprolol.  I am going to change the tartrate formulation from 50 mg twice daily to succinate 100 mg daily.  Blood pressure and heart rate were under good control today.

## 2019-08-09 NOTE — Assessment & Plan Note (Addendum)
Patient with variations in his creatinine from 0.8 up to 1.3.  Risk factors for CKD include advanced age, tobacco use.  Hypertension well controlled on medications, he has mild hyperglycemia but no diabetes.  Will check hemoglobin A1c today.  Will check BMP today.  As well as HCV antibodies.  In the future we will try to monitor renal function every 6 months.

## 2019-08-09 NOTE — Assessment & Plan Note (Signed)
Doing well on chronic Norco 7.5 which he takes twice daily.  Chronic pain generator is hip osteoarthritis.  He continues to report that the Norco improves his functional status.  Denies any falls or other adverse side effects.  Last tox assure in June was appropriate.  I sent 25-month refills to the [facility] mail order pharmacy which is providing him with the medication.  I reviewed the database which was appropriate.

## 2019-08-09 NOTE — Assessment & Plan Note (Signed)
Will check annual hemoglobin A1c today.

## 2019-08-09 NOTE — Progress Notes (Signed)
   Assessment and Plan:  See Encounters tab for problem-based medical decision making.   __________________________________________________________  HPI:   74 year old person here for follow-up of chronic hip pain and requesting refill on hydrocodone as well as Flomax.  Patient is doing well since I last saw him 3 months ago.  No emergency department visits, no recent hospitalizations.  He reports being more isolated over the last year because of the coronavirus.  Has not scheduled a vaccine appointment yet.  He is amenable to receiving the vaccine.  I gave him the phone numbers for the [facility] vaccine appointment line, hopefully he will follow-up with them.  Otherwise doing well at home.  Denies any chest pain or shortness of breath with exertion.  Independent in all activities of daily living.  Reports good adherence with his medications without adverse side effects.  No other complaints right now.  __________________________________________________________  Problem List: Patient Active Problem List   Diagnosis Date Noted  . Chronic osteomyelitis involving ankle and foot, right ([place]) 07/13/2018    Priority: High  . Chronic use of opiate for therapeutic purpose 05/01/2015    Priority: High  . CAD (coronary artery disease) 03/17/2012    Priority: High  . Tobacco use disorder 04/18/2006    Priority: High  . Prostate enlargement 04/02/2018    Priority: Medium  . Mild alcohol use disorder 12/09/2016    Priority: Medium  . Atherosclerosis of aorta ([place]) 01/13/2015    Priority: Medium  . Prediabetes 12/30/2014    Priority: Medium  . AAA (abdominal aortic aneurysm) ([place])     Priority: Medium  . CKD (chronic kidney disease) stage 2, GFR 60-89 ml/min 08/07/2007    Priority: Medium  . Essential hypertension 04/18/2006    Priority: Medium  . GERD 04/18/2006    Priority: Medium  . Premature atrial contractions 07/31/2015    Priority: Low  . Healthcare maintenance 06/30/2012    Priority: Low  . Erectile dysfunction 10/12/2010    Priority: Low  . Arthropathy of pelvic region and thigh 05/02/2008    Priority: Low  . 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    Priority: Low  . Gout 04/18/2006    Priority: Low  . Microscopic hematuria 02/15/2019    Medications: Reconciled today in Epic __________________________________________________________  Physical Exam:  Vital Signs: Vitals:   08/09/19 0819  BP: 107/60  Pulse: 64  Temp: 98.2 F (36.8 C)  TempSrc: Oral  SpO2: 99%  Weight: 170 lb 14.4 oz (77.5 kg)  Height: 6' (1.829 m)    Gen: Well appearing, NAD Neck: No cervical LAD, No thyromegaly or nodules, No JVD. CV: RRR, no murmurs Pulm: Normal effort, CTA throughout, no wheezing Abd: Soft, NT, ND Ext: Warm, no edema, normal joints

## 2019-08-09 NOTE — Patient Instructions (Signed)
Is great seeing you today in clinic.  We refilled your medications.  We will also check your blood work today to look for changes in kidney function, blood sugar, and monitor your gout.  Follow-up with me in 3 months.  Please contact [facility] health department to schedule a Covid vaccine.  This is the most effective way of preventing illness from the coronavirus. Appointments can be scheduled by phone at [phone] (Option 2) from 8:00 am-5:00 pm, until all appointments have been filled.

## 2019-08-10 ENCOUNTER — Encounter: Payer: Self-pay | Admitting: Student in an Organized Health Care Education/Training Program

## 2019-08-11 LAB — BMP8+ANION GAP
Anion Gap: 18 mmol/L (ref 10.0–18.0)
BUN/Creatinine Ratio: 12 (ref 10–24)
BUN: 15 mg/dL (ref 8–27)
CO2: 21 mmol/L (ref 20–29)
Calcium: 10 mg/dL (ref 8.6–10.2)
Chloride: 101 mmol/L (ref 96–106)
Creatinine, Ser: 1.27 mg/dL (ref 0.76–1.27)
GFR calc Af Amer: 64 mL/min/{1.73_m2} (ref >59)
GFR calc non Af Amer: 56 mL/min/{1.73_m2} — ABNORMAL LOW (ref >59)
Glucose: 101 mg/dL — ABNORMAL HIGH (ref 65–99)
Potassium: 3.9 mmol/L (ref 3.5–5.2)
Sodium: 140 mmol/L (ref 134–144)

## 2019-08-11 LAB — URIC ACID: Uric Acid: 4.1 mg/dL (ref 3.8–8.4)

## 2019-08-11 LAB — HEPATITIS C ANTIBODY: Hep C Virus Ab: <0.1 s/co ratio (ref 0.0–0.9)

## 2019-08-31 IMAGING — US US AORTA
1 series · 14 of 24 positions shown · non-contrast
Comparison: 03/09/2015

CLINICAL DATA: Abdominal aortic aneurysm

EXAM:
ULTRASOUND OF ABDOMINAL AORTA
TECHNIQUE: Ultrasound examination of the abdominal aorta was performed to
evaluate for abdominal aortic aneurysm.

[Series 1: us aorta · 0.31mm/px · 14 of 24 slices shown]
[im 1/24]
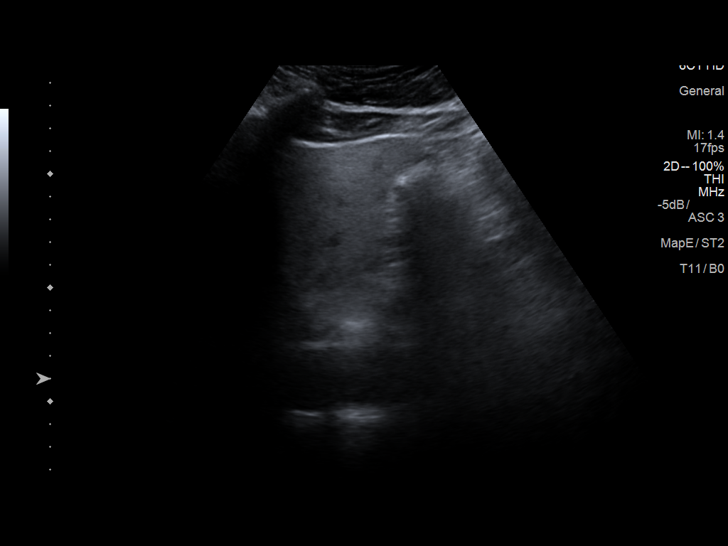
[im 3/24]
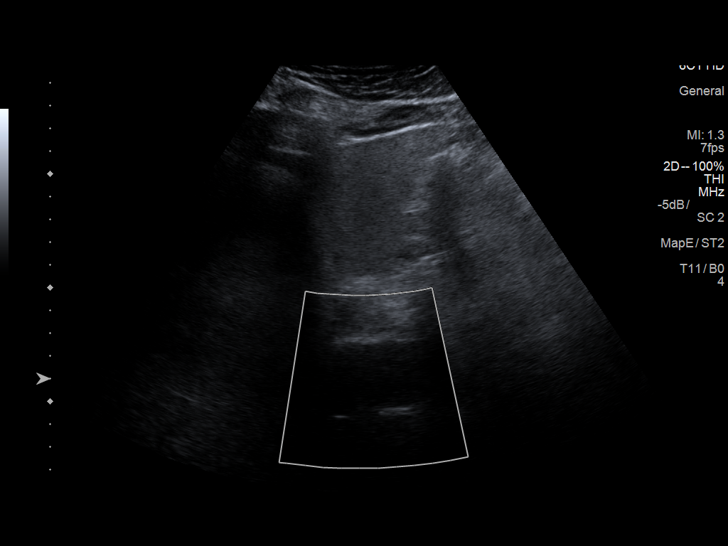
[im 5/24]
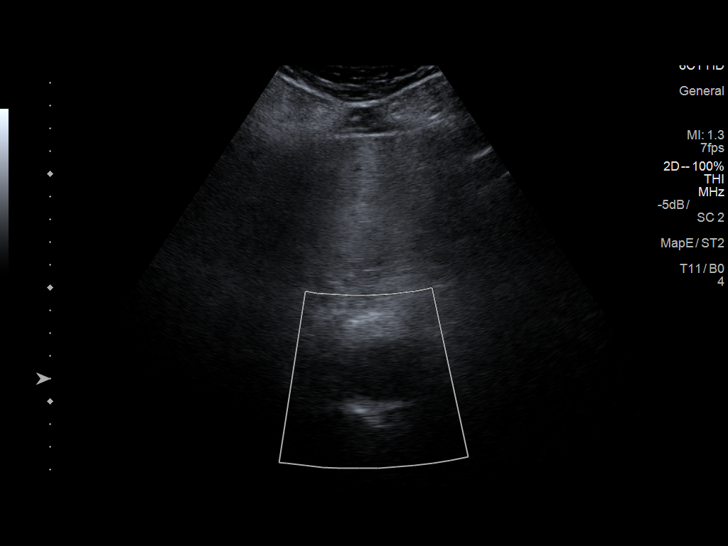
[im 7/24]
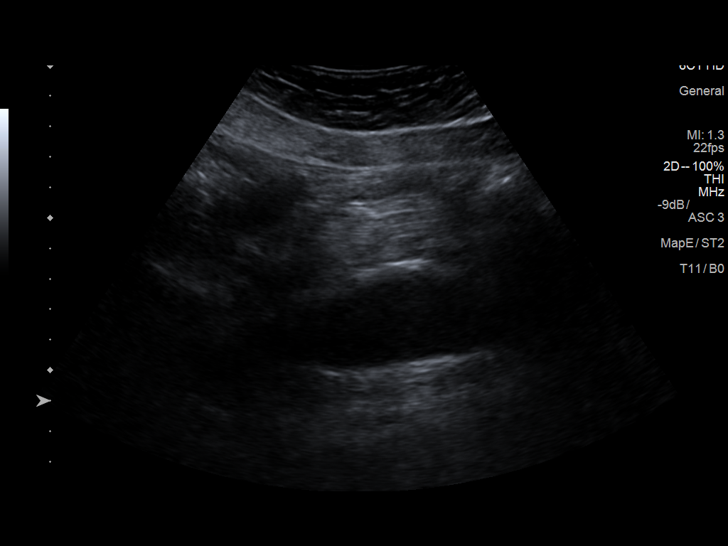
[im 8/24]
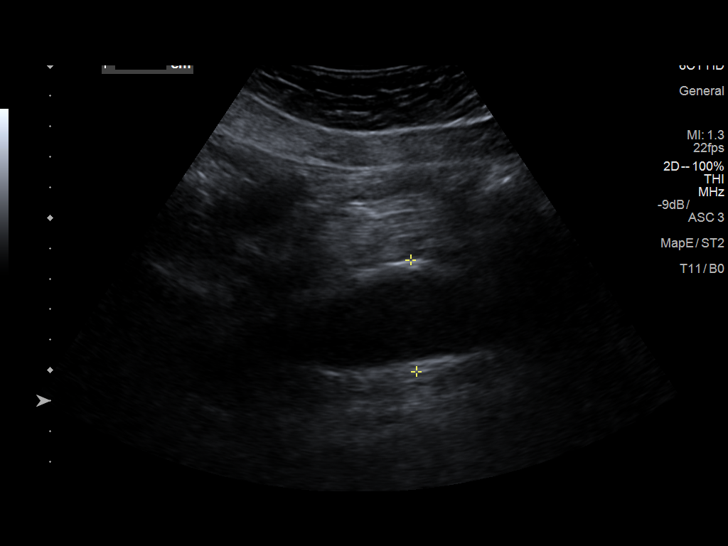
[im 10/24]
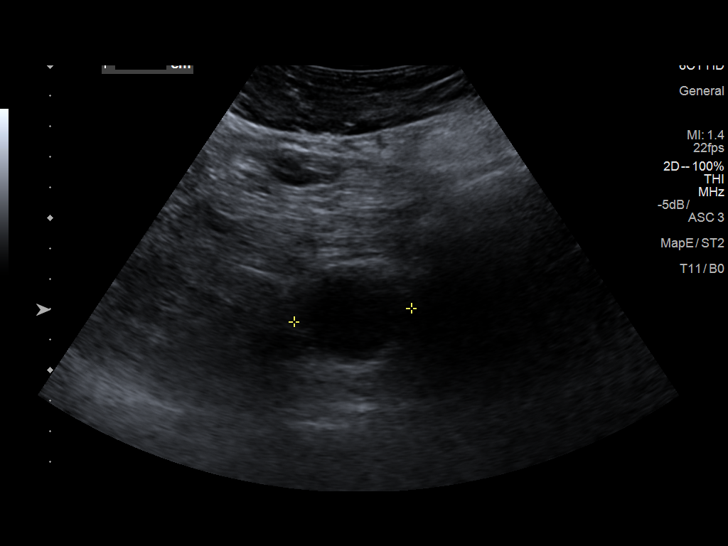
[im 12/24]
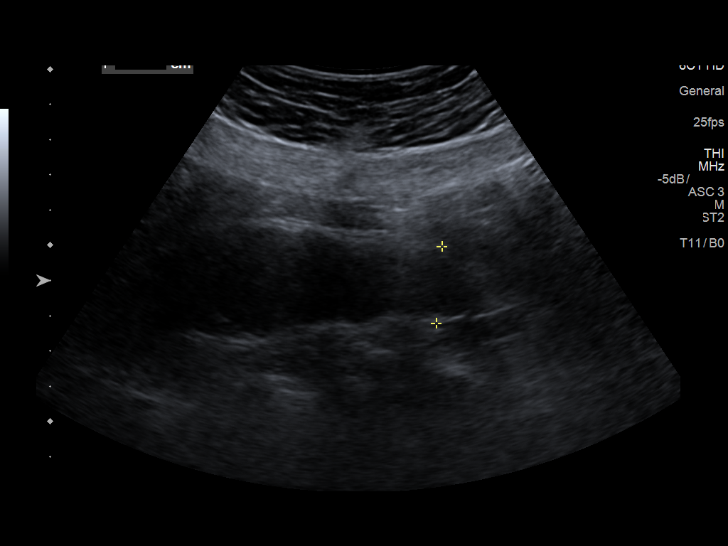
[im 13/24]
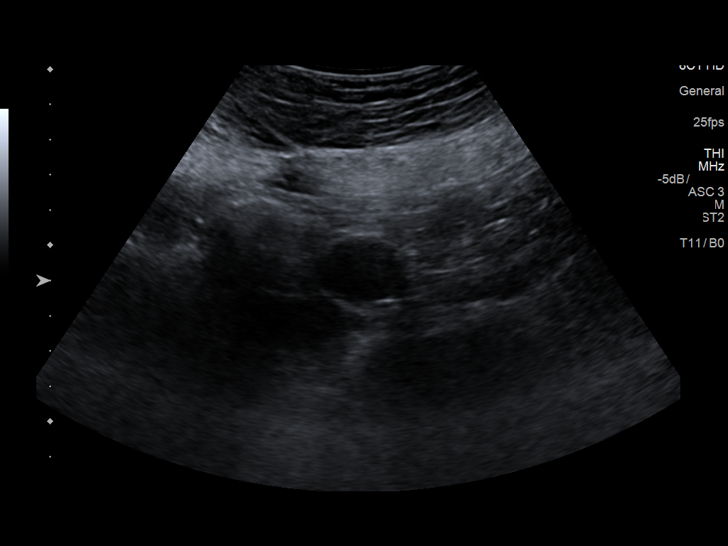
[im 15/24]
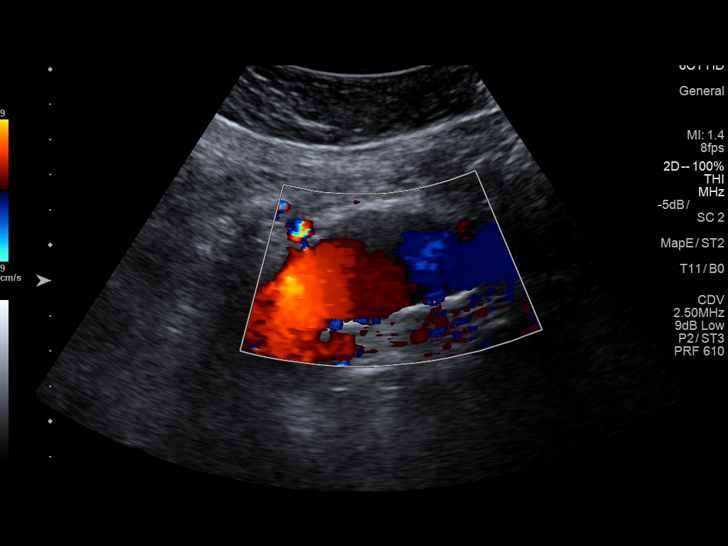
[im 17/24]
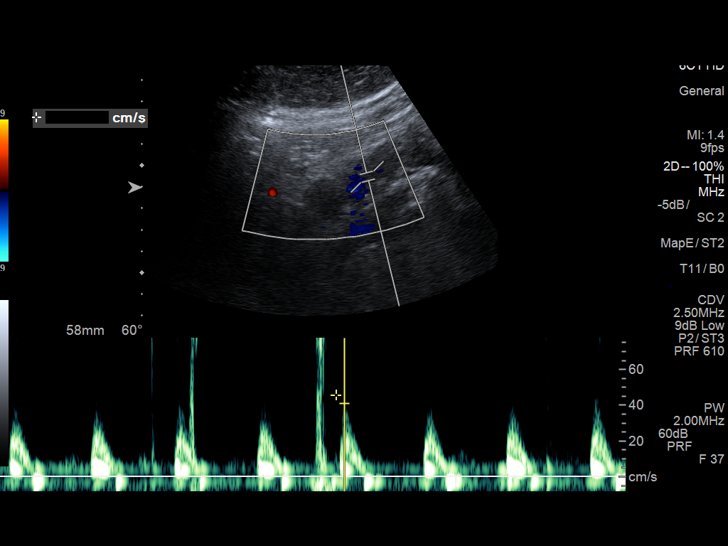
[im 19/24]
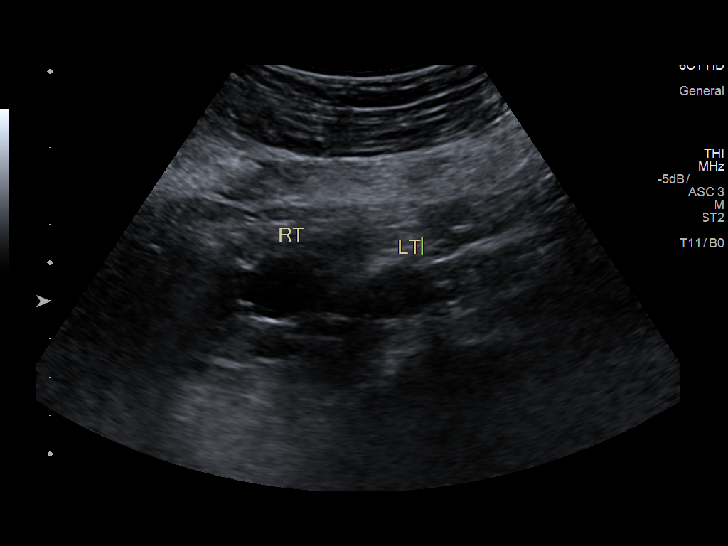
[im 20/24]
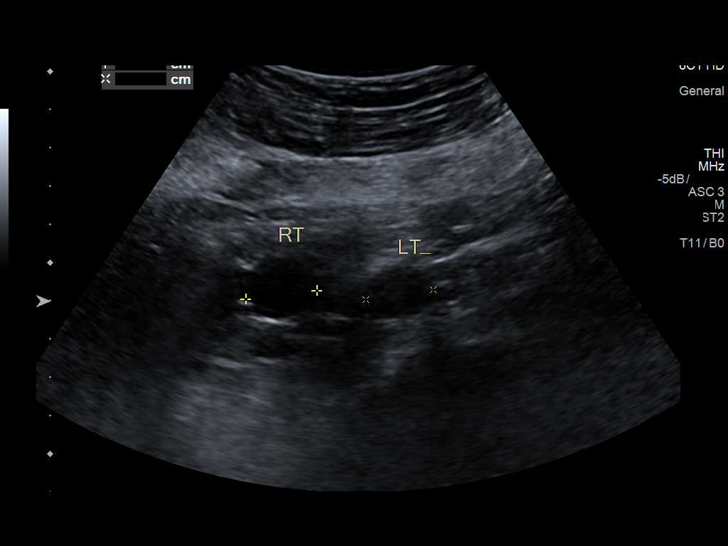
[im 22/24]
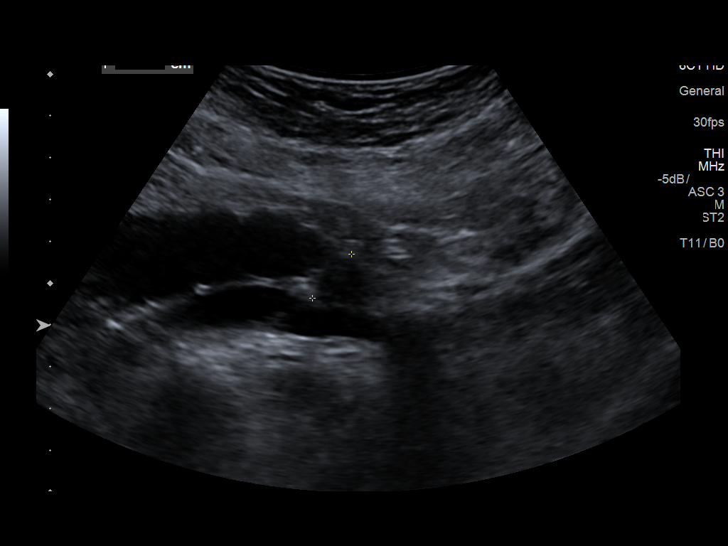
[im 24/24]
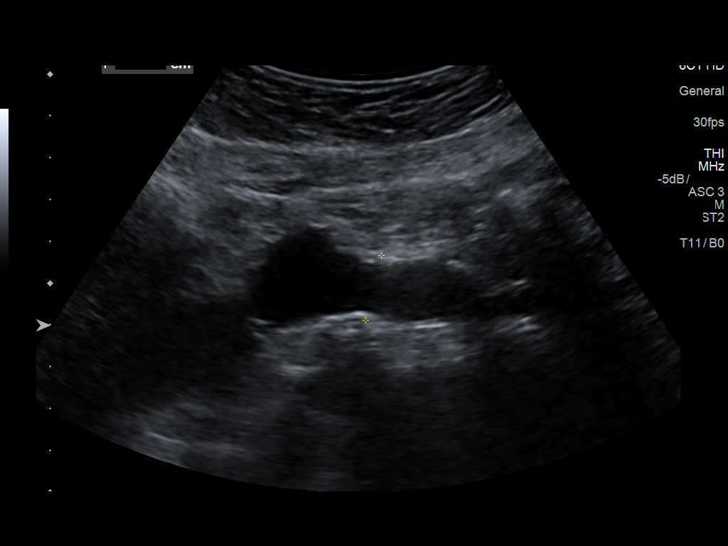

[14 of 24 positions shown; findings below may reference images not displayed]

FINDINGS: Abdominal aortic measurements as follows:

Proximal:  3.2 cm

Mid:  3.9 cm

Distal:   2.7 cm

Right iliac: 1.9 cm

Left iliac: 1.8 cm

Aortic atherosclerosis and mild fusiform aneurysm dilatation of the
abdominal aorta, maximal diameter 3.9 cm, previously 3.3 cm. Small
iliac bifurcation aneurysms also noted.
IMPRESSION: 3.9 cm abdominal aortic aneurysm.

Recommend followup by ultrasound in 2 years. This recommendation
follows ACR consensus guidelines: White Paper of the ACR [employer]

## 2019-09-07 ENCOUNTER — Other Ambulatory Visit: Payer: Self-pay | Admitting: *Deleted

## 2019-09-08 MED ORDER — ROSUVASTATIN CALCIUM 20 MG PO TABS: 20.0000 mg | ORAL_TABLET | Freq: Every day | ORAL | 1 refills | Status: DC

## 2019-09-26 IMAGING — CT CT CHEST W/ CM
2 of 4 series · 15 of 36 positions shown, 18 images · IV contrast (ISOVUE 300)
Comparison: 11/04/2016

CLINICAL DATA: Restaging lung cancer.

EXAM:
CT CHEST WITH CONTRAST
TECHNIQUE: Multidetector CT imaging of the chest was performed during
intravenous contrast administration.
CONTRAST:  75mL GSB905-XBB IOPAMIDOL ([id number]-XBB) INJECTION 61%

[Series 2: axial st · axial · 0.87mm/px · z∈[+1388,+1668]mm · 12 of 164 slices shown, 15 images]
[im 12/164  mediastinal]
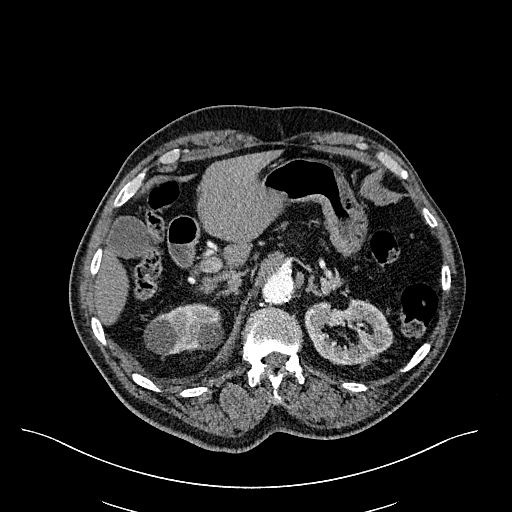
[im 12/164  lung]
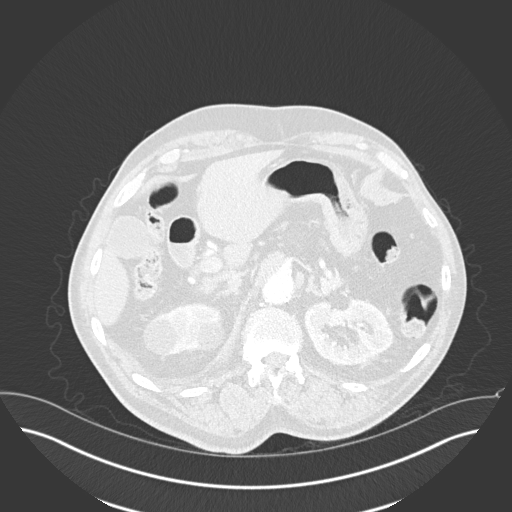
[im 24/164  lung]
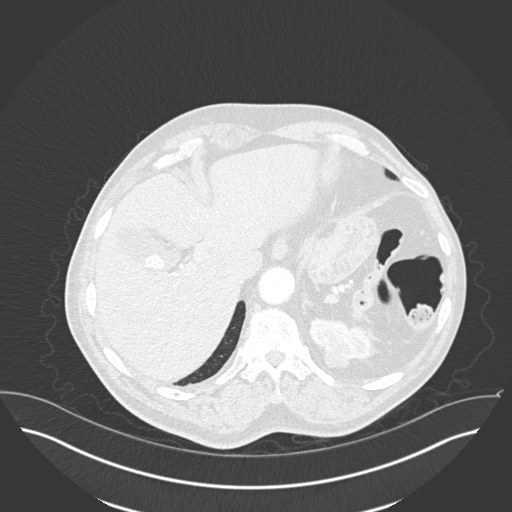
[im 35/164  lung]
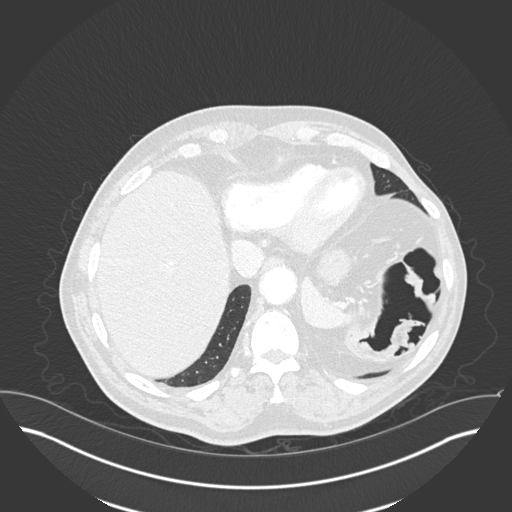
[im 47/164  lung]
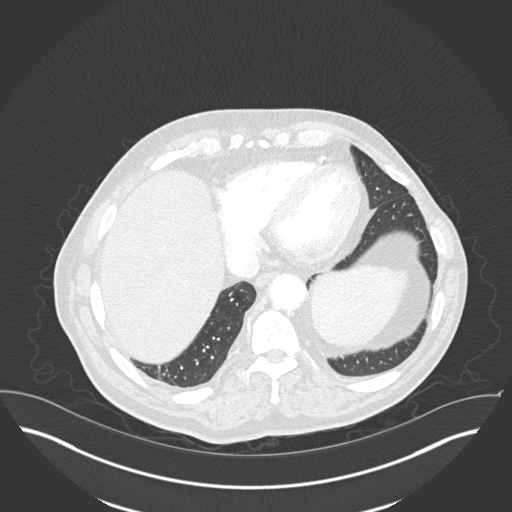
[im 59/164  mediastinal]
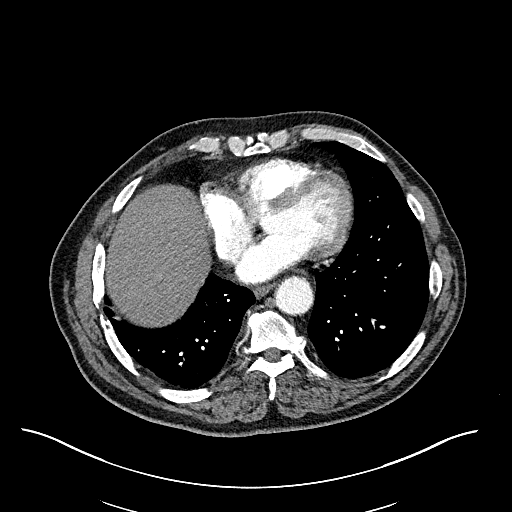
[im 59/164  lung]
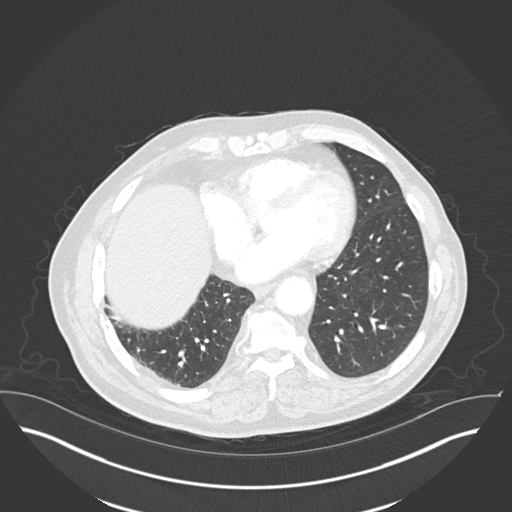
[im 70/164  lung]
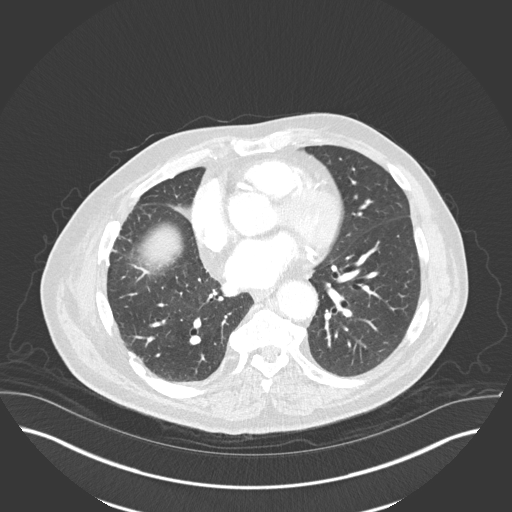
[im 94/164  lung]
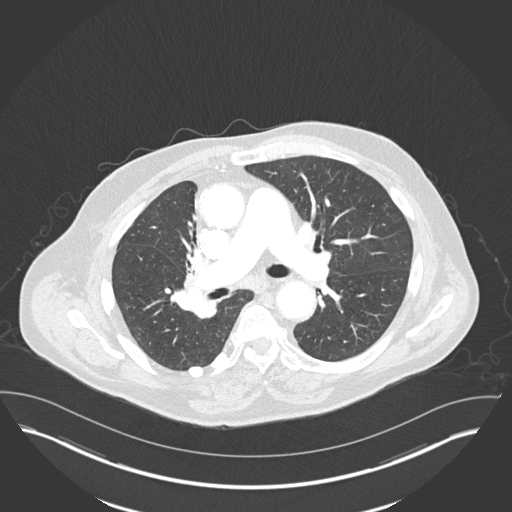
[im 105/164  lung]
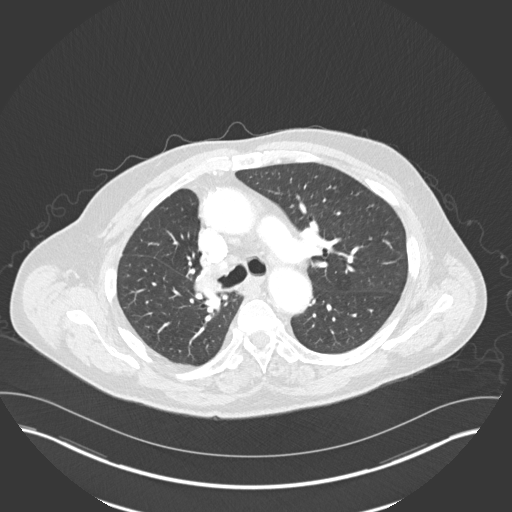
[im 117/164  mediastinal]
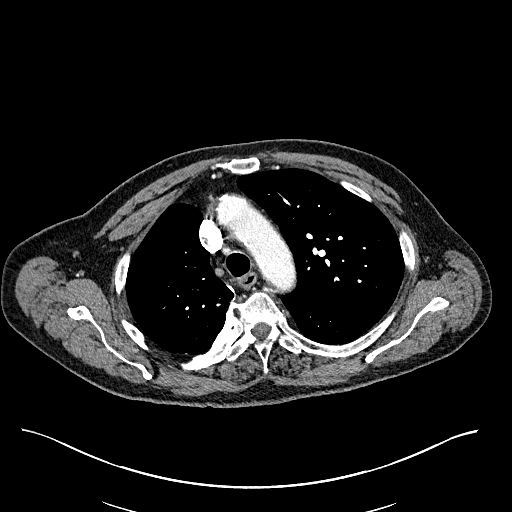
[im 117/164  lung]
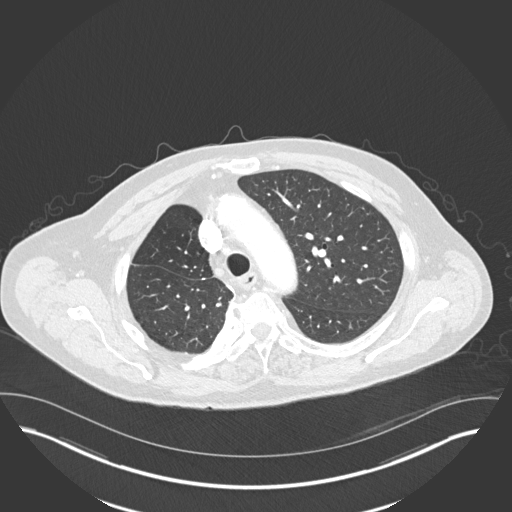
[im 129/164  lung]
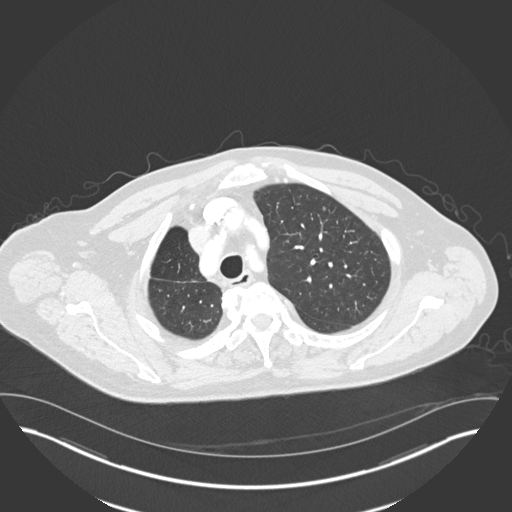
[im 140/164  lung]
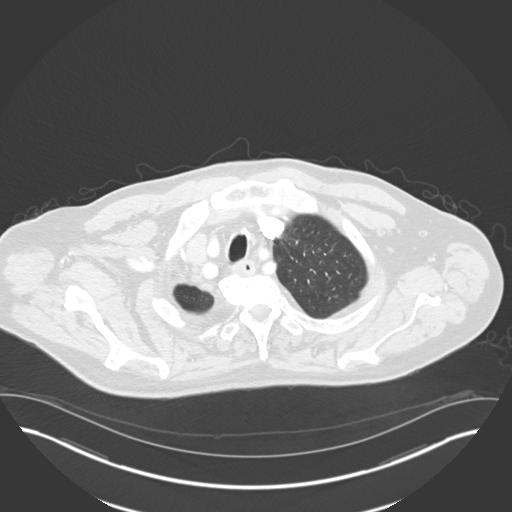
[im 152/164  lung]
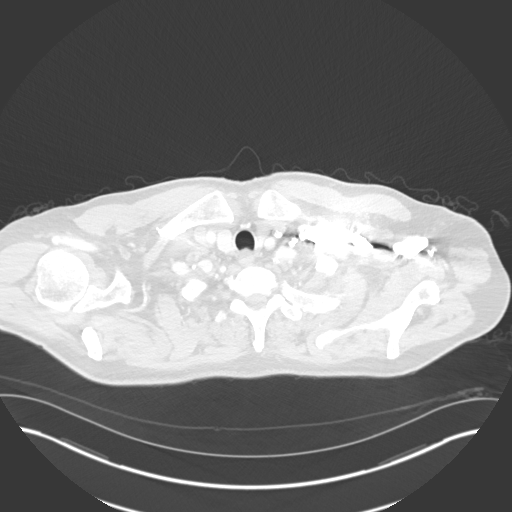

[Series 6: coronal · coronal · 0.66mm/px · 3 of 134 slices shown]
[im 27/134  lung]
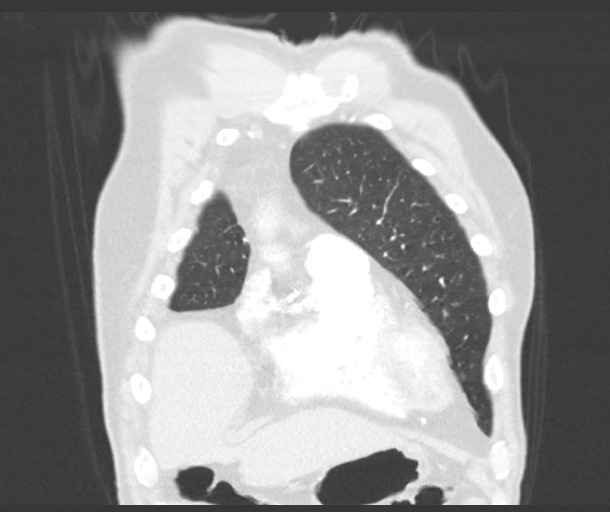
[im 54/134  lung]
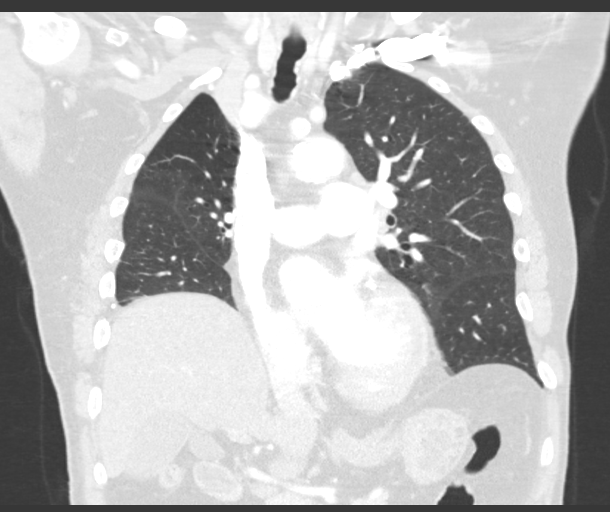
[im 80/134  lung]
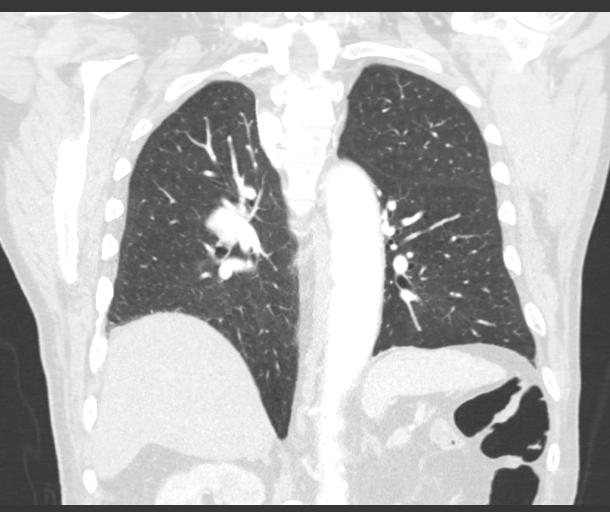

[15 of 36 positions shown; findings below may reference images not displayed]

FINDINGS: Cardiovascular: The heart is normal in size and stable. No
pericardial effusion. Stable moderate tortuosity, ectasia and
calcification of the thoracic aorta and branch vessels. Stable dense
three-vessel coronary artery calcifications.

Mediastinum/Nodes: Small scattered mediastinal and hilar lymph nodes
are stable. No new or progressive adenopathy.

Right hilar lymph node on image number 61 measures 19.5 mm and is
stable. Slightly more inferiorly on image number 64 is a stable
mm lymph node.

11 mm left hilar lymph node on image number 79 is stable.

The esophagus is grossly normal.

Lungs/Pleura: Stable emphysematous changes and pulmonary scarring.
Surgical changes involving the right lung with loss of volume. No
findings for recurrent tumor. Numerous small, sub 4 mm pulmonary
nodules appears stable. No new pulmonary lesions. No acute pulmonary
findings. No pleural effusion.

Upper Abdomen: No to significant upper abdominal findings. Stable
cholelithiasis and right renal cyst. No worrisome hepatic or adrenal
gland lesions. Diffuse fatty infiltration of the liver suspected.
Stable elongated upper pole left renal lesion, most likely a benign
complex cysts. No significant change since 8913.

Musculoskeletal: No significant bony findings. Stable surgical
changes from a left thoracotomy.
IMPRESSION: 1. Stable postoperative changes involving the right lung. No
findings suspicious for recurrent tumor.
2. Stable borderline hilar lymph nodes.
3. Numerous small scattered sub 4 mm pulmonary nodules appears
stable and are likely benign.
4. Stable emphysematous changes.
5. Stable tortuosity, ectasia and calcification of the thoracic
aorta and branch vessels including the coronary arteries.
6. Stable left upper pole renal lesion and right renal cysts.

Five

## 2019-11-08 ENCOUNTER — Encounter: Payer: Self-pay | Admitting: Student in an Organized Health Care Education/Training Program

## 2019-11-08 ENCOUNTER — Ambulatory Visit (INDEPENDENT_AMBULATORY_CARE_PROVIDER_SITE_OTHER): Payer: Medicare HMO | Admitting: Student in an Organized Health Care Education/Training Program

## 2019-11-08 VITALS — BP 93/55 | HR 63 | Temp 98.1°F | Ht 72.0 in | Wt 164.7 lb

## 2019-11-08 DIAGNOSIS — Z79891 Long term (current) use of opiate analgesic: Secondary | ICD-10-CM | POA: Diagnosis not present

## 2019-11-08 DIAGNOSIS — F17200 Nicotine dependence, unspecified, uncomplicated: Secondary | ICD-10-CM | POA: Diagnosis not present

## 2019-11-08 DIAGNOSIS — I10 Essential (primary) hypertension: Secondary | ICD-10-CM

## 2019-11-08 DIAGNOSIS — M25551 Pain in right hip: Secondary | ICD-10-CM

## 2019-11-08 DIAGNOSIS — R3129 Other microscopic hematuria: Secondary | ICD-10-CM

## 2019-11-08 MED ORDER — ALLOPURINOL 100 MG PO TABS: 100.0000 mg | ORAL_TABLET | Freq: Every day | ORAL | 3 refills | Status: DC

## 2019-11-08 MED ORDER — HYDROCODONE-ACETAMINOPHEN 7.5-325 MG PO TABS: 1.0000 | ORAL_TABLET | Freq: Two times a day (BID) | ORAL | 0 refills | Status: DC | PRN

## 2019-11-08 NOTE — Progress Notes (Signed)
   Assessment and Plan:  See Encounters tab for problem-based medical decision making.   __________________________________________________________  HPI:   74 year old person here for follow-up of chronic pain.  Hip osteoarthritis since his pain generator.  Doing very well at home.  Uses hydrocodone 7.5 mg twice daily and reports good effect.  Still smoking on a daily basis, not interested in quitting.  No recent illnesses.  Had Covid vaccinations in March and April.  No chest pain, having some dyspnea with exertion, felt short of breath walking down to our clinic.  Currently not function limiting, no cough.  Little hypotensive today but he denies lightheadedness, dizziness, syncope, presyncope, or any falls.  __________________________________________________________  Problem List: Patient Active Problem List   Diagnosis Date Noted  . Chronic use of opiate for therapeutic purpose 05/01/2015    Priority: High  . CAD (coronary artery disease) 03/17/2012    Priority: High  . Tobacco use disorder 04/18/2006    Priority: High  . Prostate enlargement 04/02/2018    Priority: Medium  . Mild alcohol use disorder 12/09/2016    Priority: Medium  . Atherosclerosis of aorta ([place]) 01/13/2015    Priority: Medium  . Prediabetes 12/30/2014    Priority: Medium  . AAA (abdominal aortic aneurysm) ([place])     Priority: Medium  . CKD (chronic kidney disease) stage 2, GFR 60-89 ml/min 08/07/2007    Priority: Medium  . Essential hypertension 04/18/2006    Priority: Medium  . GERD 04/18/2006    Priority: Medium  . Premature atrial contractions 07/31/2015    Priority: Low  . Healthcare maintenance 06/30/2012    Priority: Low  . Erectile dysfunction 10/12/2010    Priority: Low  . Arthropathy of pelvic region and thigh 05/02/2008    Priority: Low  . 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    Priority: Low  . Gout 04/18/2006    Priority: Low  . Microscopic hematuria 02/15/2019    Medications:  Reconciled today in Epic __________________________________________________________  Physical Exam:  Vital Signs: Vitals:   11/08/19 0854  BP: (!) 93/55  Pulse: 63  Temp: 98.1 F (36.7 C)  TempSrc: Oral  SpO2: 99%  Weight: 164 lb 11.2 oz (74.7 kg)  Height: 6' (1.829 m)    Gen: Well appearing, NAD CV: RRR, no murmurs Pulm: Normal effort, CTA throughout, no wheezing Abd: Soft, NT, ND.  Ext: Warm, no edema, normal joints Skin: No atypical appearing moles. No rashes

## 2019-11-08 NOTE — Assessment & Plan Note (Signed)
History of both gross and microscopic hematuria in the past.  I have not received any notes from urology, he is unsure if he has had follow-up with them.  He is at risk for bladder malignancy due to ongoing tobacco use.  We will check a urinalysis today, likely he needs a little help navigating the referral process back to his urologist office.

## 2019-11-08 NOTE — Assessment & Plan Note (Signed)
Blood pressure little low today at 95 systolic.  Asymptomatic, no presyncope or syncope at home.  Plan to continue with amlodipine 10 mg daily and metoprolol succinate 100 mg daily, also on Flomax 0.8 mg in the evening for management of BPH.  If he has more symptoms we can decrease antihypertensives in the future.

## 2019-11-08 NOTE — Assessment & Plan Note (Signed)
Symptomatically doing well.  Finds good functional benefit from low-dose hydrocodone twice daily.  No falls or other adverse effects of the medicine.  Patient wants to continue with treatment because he feels he is finding good benefit from it.  I refilled a 54-month supply of hydrocodone, he receives this in the mail out through [place].  We will check tox assure today.  I reviewed the database which was appropriate.

## 2019-11-09 LAB — URINALYSIS, COMPLETE
Bilirubin, UA: NEGATIVE
Glucose, UA: NEGATIVE
Ketones, UA: NEGATIVE
Nitrite, UA: POSITIVE — AB
RBC, UA: NEGATIVE
Specific Gravity, UA: 1.007 (ref 1.005–1.030)
Urobilinogen, Ur: 1 mg/dL (ref 0.2–1.0)
pH, UA: 6 (ref 5.0–7.5)

## 2019-11-09 LAB — MICROSCOPIC EXAMINATION
Casts: NONE SEEN /lpf
RBC, Urine: NONE SEEN /hpf (ref 0–2)

## 2019-11-11 LAB — TOXASSURE SELECT,+ANTIDEPR,UR

## 2020-02-01 ENCOUNTER — Other Ambulatory Visit: Payer: Self-pay | Admitting: Student in an Organized Health Care Education/Training Program

## 2020-02-08 ENCOUNTER — Other Ambulatory Visit: Payer: Self-pay | Admitting: Student in an Organized Health Care Education/Training Program

## 2020-02-08 DIAGNOSIS — Z79891 Long term (current) use of opiate analgesic: Secondary | ICD-10-CM

## 2020-02-08 DIAGNOSIS — M25552 Pain in left hip: Secondary | ICD-10-CM

## 2020-02-08 MED ORDER — HYDROCODONE-ACETAMINOPHEN 7.5-325 MG PO TABS: 1.0000 | ORAL_TABLET | Freq: Two times a day (BID) | ORAL | 0 refills | Status: DC | PRN

## 2020-02-08 NOTE — Telephone Encounter (Signed)
Need refill on HYDROcodone-acetaminophen (NORCO) 7.5-325 MG tablet ;pt contact [place], [place]

## 2020-02-14 ENCOUNTER — Encounter: Payer: Self-pay | Admitting: Student in an Organized Health Care Education/Training Program

## 2020-02-14 ENCOUNTER — Other Ambulatory Visit: Payer: Self-pay

## 2020-02-14 ENCOUNTER — Ambulatory Visit (HOSPITAL_COMMUNITY)
Admission: RE | Admit: 2020-02-14 | Discharge: 2020-02-14 | Disposition: A | Discharge status: Home / Self Care | Payer: Medicare HMO | Source: Ambulatory Visit | Attending: Student in an Organized Health Care Education/Training Program | Admitting: Student in an Organized Health Care Education/Training Program

## 2020-02-14 ENCOUNTER — Ambulatory Visit (INDEPENDENT_AMBULATORY_CARE_PROVIDER_SITE_OTHER): Payer: Medicare HMO | Admitting: Student in an Organized Health Care Education/Training Program

## 2020-02-14 VITALS — BP 108/62 | HR 69 | Wt 156.7 lb

## 2020-02-14 DIAGNOSIS — I714 Abdominal aortic aneurysm, without rupture, unspecified: Secondary | ICD-10-CM

## 2020-02-14 DIAGNOSIS — R0600 Dyspnea, unspecified: Secondary | ICD-10-CM

## 2020-02-14 DIAGNOSIS — N320 Bladder-neck obstruction: Secondary | ICD-10-CM | POA: Diagnosis not present

## 2020-02-14 DIAGNOSIS — R7303 Prediabetes: Secondary | ICD-10-CM

## 2020-02-14 DIAGNOSIS — F17210 Nicotine dependence, cigarettes, uncomplicated: Secondary | ICD-10-CM

## 2020-02-14 DIAGNOSIS — F17200 Nicotine dependence, unspecified, uncomplicated: Secondary | ICD-10-CM

## 2020-02-14 DIAGNOSIS — R0609 Other forms of dyspnea: Secondary | ICD-10-CM | POA: Insufficient documentation

## 2020-02-14 LAB — CBC
HCT: 41.1 % (ref 39.0–52.0)
Hemoglobin: 13.2 g/dL (ref 13.0–17.0)
MCH: 33.5 pg (ref 26.0–34.0)
MCHC: 32.1 g/dL (ref 30.0–36.0)
MCV: 104.3 fL — ABNORMAL HIGH (ref 80.0–100.0)
Platelets: DECREASED K/uL (ref 150–400)
RBC: 3.94 MIL/uL — ABNORMAL LOW (ref 4.22–5.81)
RDW: 13.4 % (ref 11.5–15.5)
WBC: 5.1 K/uL (ref 4.0–10.5)
nRBC: 0 % (ref 0.0–0.2)

## 2020-02-14 LAB — COMPREHENSIVE METABOLIC PANEL WITH GFR
ALT: 28 U/L (ref 0–44)
AST: 66 U/L — ABNORMAL HIGH (ref 15–41)
Albumin: 3.3 g/dL — ABNORMAL LOW (ref 3.5–5.0)
Alkaline Phosphatase: 70 U/L (ref 38–126)
Anion gap: 12 (ref 5–15)
BUN: 11 mg/dL (ref 8–23)
CO2: 25 mmol/L (ref 22–32)
Calcium: 10 mg/dL (ref 8.9–10.3)
Chloride: 104 mmol/L (ref 98–111)
Creatinine, Ser: 1.28 mg/dL — ABNORMAL HIGH (ref 0.61–1.24)
GFR calc Af Amer: >60 mL/min
GFR calc non Af Amer: 55 mL/min — ABNORMAL LOW
Glucose, Bld: 99 mg/dL (ref 70–99)
Potassium: 4 mmol/L (ref 3.5–5.1)
Sodium: 141 mmol/L (ref 135–145)
Total Bilirubin: 1.1 mg/dL (ref 0.3–1.2)
Total Protein: 7.5 g/dL (ref 6.5–8.1)

## 2020-02-14 LAB — GLUCOSE, CAPILLARY: Glucose-Capillary: 89 mg/dL (ref 70–99)

## 2020-02-14 LAB — POCT GLYCOSYLATED HEMOGLOBIN (HGB A1C): Hemoglobin A1C: 5.6 % (ref 4.0–5.6)

## 2020-02-14 LAB — TSH: TSH: 0.648 u[IU]/mL (ref 0.350–4.500)

## 2020-02-14 NOTE — Addendum Note (Signed)
Addended by: Lalla Brothers T on: 02/14/2020 01:37 PM   Modules accepted: Orders

## 2020-02-14 NOTE — Assessment & Plan Note (Signed)
Patient smokes about 6-7 cigarettes/day, has been smoking for roughly 55 years.  Not interested in quitting right now, though I have counseled him about the dangers including ischemic vascular disease and COPD.

## 2020-02-14 NOTE — Patient Instructions (Signed)
You have 2 major problems right now that we have to work on.  First, the enlarging of your abdominal aorta, the main artery in your body, seems like it is growing.  We need to get a ultrasound of your abdomen as soon as possible.  We are working to arrange this now.  Once that is completed, we will schedule you back to see the vascular surgeons as soon as possible as well.  Secondly, you are showing signs of urine retention, a very distended bladder.  This puts you at risk for future bladder problems including urine infections.  We talked about doing a catheter, but you do not want to do this today.  We are going to schedule you for an appointment with [facility] hopefully for the next 1 or 2 weeks.  We will be in close contact, we need to make sure that we take care of these issues.

## 2020-02-14 NOTE — Assessment & Plan Note (Signed)
Patient not having much lower urinary tract symptoms.  I noticed that he had some leaking on his pants.  Post void residual today in clinic was 480 mL.  I offered the patient a Foley catheter, discussed the risk of bladder stretch injury as well as infection.  He declined at the catheter, has had bad experiences with this in the past.  Will refer him to [facility] hopefully to be seen in the next week or 2.

## 2020-02-14 NOTE — Assessment & Plan Note (Signed)
New pulsatile mass on his abdominal exam.  Has been several years since he has completed an ultrasound for this known small infrarenal abdominal aortic aneurysm.  Last saw Dr. Scot Dock with vascular surgery in 2017.  I am going to order a stat ultrasound of the abdominal aortic aneurysm as I suspect that it is enlarging.  He has no symptoms to suggest aortic dissection at this time, but given his very significant tobacco use history he is at risk for this.

## 2020-02-14 NOTE — Assessment & Plan Note (Signed)
He was complaining of dyspnea on exertion, can walk about a block before feeling short of breath.  He appears euvolemic on exam today, no signs of volume overload to suggest heart failure or pulmonary edema.  No wheezing on exam to suggest pneumonia or reactive airway disease.  Very significant tobacco use history.  Plan to check a CBC today to rule out anemia.  At some point we will need to obtain pulmonary function testing to evaluate for COPD as he may benefit from inhaler therapy.

## 2020-02-14 NOTE — Assessment & Plan Note (Signed)
Plan to check A1c today.

## 2020-02-14 NOTE — Progress Notes (Signed)
   Assessment and Plan:  See Encounters tab for problem-based medical decision making.   __________________________________________________________  HPI:   74 year old person living with tobacco use disorder ischemic vascular disease, hypertension, here for routine follow-up.  He has an acute complaint today of increasing dyspnea with exertion over the last several weeks.  Says he now starts to feel short of breath with doing house chores.  Feels like he can walk about 1 city block before feeling short of breath.  Denies any chest pain or chest pressure.  Denies any abdominal pain.  Does report some food aversion, just does not feel hungry anymore.  He has had an 8 pound unintentional weight loss over the last 3 months.  No vomiting, plenty of access to food.  Continues to smoke about a third a pack per day.  Adherent to his other medications.  __________________________________________________________  Problem List: Patient Active Problem List   Diagnosis Date Noted  . Chronic use of opiate for therapeutic purpose 05/01/2015    Priority: High  . CAD (coronary artery disease) 03/17/2012    Priority: High  . Tobacco use disorder 04/18/2006    Priority: High  . Bladder outlet obstruction 04/02/2018    Priority: Medium  . Mild alcohol use disorder 12/09/2016    Priority: Medium  . Atherosclerosis of aorta ([place]) 01/13/2015    Priority: Medium  . Prediabetes 12/30/2014    Priority: Medium  . AAA (abdominal aortic aneurysm) ([place])     Priority: Medium  . CKD (chronic kidney disease) stage 2, GFR 60-89 ml/min 08/07/2007    Priority: Medium  . Essential hypertension 04/18/2006    Priority: Medium  . GERD 04/18/2006    Priority: Medium  . Premature atrial contractions 07/31/2015    Priority: Low  . Healthcare maintenance 06/30/2012    Priority: Low  . Erectile dysfunction 10/12/2010    Priority: Low  . Arthropathy of pelvic region and thigh 05/02/2008    Priority: Low  . Colon  polyposis - attenuated 04/18/2006    Priority: Low  . Gout 04/18/2006    Priority: Low  . DOE (dyspnea on exertion) 02/14/2020  . Microscopic hematuria 02/15/2019    Medications: Reconciled today in Epic __________________________________________________________  Physical Exam:  Vital Signs: Vitals:   02/14/20 0918  BP: 108/62  Pulse: 69  SpO2: 94%  Weight: 156 lb 11.2 oz (71.1 kg)    Gen: Well appearing, NAD Neck: No cervical LAD, No thyromegaly or nodules, No JVD. CV: RRR, no murmurs Pulm: Normal effort, CTA throughout, no wheezing Abd: Soft, thin, there is a pulsating mass felt in the mid abdomen to about the level of the umbilicus consistent with enlarging abdominal aortic aneurysm, there is no tenderness to palpation of the abdomen. Ext: Warm, no edema, normal joints  POCUS: Transverse view of the abdomen shows the pulsatile mass is an approximately 4.3x5.1 AAA, enlarged from prior imaging. No dissection flap appreciated.

## 2020-03-14 ENCOUNTER — Other Ambulatory Visit: Payer: Self-pay | Admitting: Student in an Organized Health Care Education/Training Program

## 2020-03-14 DIAGNOSIS — R3914 Feeling of incomplete bladder emptying: Secondary | ICD-10-CM | POA: Diagnosis not present

## 2020-03-14 DIAGNOSIS — Z79891 Long term (current) use of opiate analgesic: Secondary | ICD-10-CM

## 2020-03-14 DIAGNOSIS — R3912 Poor urinary stream: Secondary | ICD-10-CM | POA: Diagnosis not present

## 2020-03-14 DIAGNOSIS — M25551 Pain in right hip: Secondary | ICD-10-CM

## 2020-03-14 MED ORDER — HYDROCODONE-ACETAMINOPHEN 7.5-325 MG PO TABS: 1.0000 | ORAL_TABLET | Freq: Two times a day (BID) | ORAL | 0 refills | Status: DC | PRN

## 2020-03-14 NOTE — Telephone Encounter (Signed)
Refill Request  HYDROcodone-acetaminophen (NORCO) 7.5-325 MG tablet   [id number] [place], [place] - [address] AT [place]

## 2020-03-15 ENCOUNTER — Encounter: Payer: Self-pay | Admitting: Vascular Surgery

## 2020-03-15 ENCOUNTER — Other Ambulatory Visit: Payer: Self-pay

## 2020-03-15 ENCOUNTER — Ambulatory Visit (INDEPENDENT_AMBULATORY_CARE_PROVIDER_SITE_OTHER): Payer: Medicare HMO | Admitting: Vascular Surgery

## 2020-03-15 VITALS — BP 118/68 | HR 68 | Temp 97.7°F | Resp 20 | Ht 72.0 in | Wt 152.0 lb

## 2020-03-15 DIAGNOSIS — I714 Abdominal aortic aneurysm, without rupture, unspecified: Secondary | ICD-10-CM

## 2020-03-15 DIAGNOSIS — I739 Peripheral vascular disease, unspecified: Secondary | ICD-10-CM | POA: Diagnosis not present

## 2020-03-15 DIAGNOSIS — I509 Heart failure, unspecified: Secondary | ICD-10-CM | POA: Diagnosis not present

## 2020-03-15 NOTE — Progress Notes (Signed)
REASON FOR CONSULT:    Abdominal aortic aneurysm. The consult is requested by Dr. Evette Doffing  ASSESSMENT & PLAN:   5.2 CM INFRARENAL ABDOMINAL AORTIC ANEURYSM: This patient has a 5.2 cm infrarenal abdominal aortic aneurysm.  He is asymptomatic.  I explained that we would normally consider elective repair of a aneurysm in a normal risk patient at 5.5 cm.  His blood pressure appears to be under good control.  I have explained that smoking does increase his risk of aneurysm expansion and rupture.  We have discussed the importance of tobacco cessation.  I have ordered a follow-up ultrasound in 6 months and I will see him back at that time.  If the aneurysm continues to enlarge then he will need a CT angiogram.  His records suggest some history of chronic kidney disease but GFR in August was normal.  PERIPHERAL VASCULAR DISEASE: He has evidence of infrainguinal arterial occlusive disease bilaterally.  He has some claudication but no rest pain.  I have ordered ABIs to have as a baseline when he returns in 6 months.  We have again discussed the importance of tobacco cessation.   Deitra Mayo, MD Office: [phone]   HPI:   [name] is a pleasant 74 y.o. male, who I last saw back in March 2017 with possible osteomyelitis of the distal right third toe. He also had a known small 3.3 cm aneurysm at that time. I recommended a follow-up ultrasound in 2 years and he was then lost to follow-up.  The patient was referred back as a recent ultrasound in August showed that the aneurysm had enlarged to 5.2 cm.  Of note he denies any abdominal pain or back pain.  There is no family history of aneurysmal disease.  He continues to smoke half a pack per day of cigarettes and has been smoking for 55 years.  He states that his blood pressure has been under good control.  His risk factors for peripheral vascular disease include hypertension, hypercholesterolemia, and tobacco abuse.  He denies any history of  diabetes or family history of premature cardiovascular disease.  He is on aspirin and is on a statin.  He has some mild bilateral lower extremity claudication.  He denies any history of rest pain or nonhealing ulcers.  Past Medical History:  Diagnosis Date  . AAA (abdominal aortic aneurysm) (H[facility])    noted CT 11/2012 size 3.3 x 3.2 cm  . Alcohol abuse   . Barretts esophagus    bx distal esophagus 2011 neg   . Bladder outlet obstruction 04/02/2018  . Cataract   . Cataract   . CHF (congestive heart failure) ([place])   . Cholelithiasis   . Chronic osteomyelitis involving ankle and foot, right ([place]) 07/13/2018  . 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   2006 - 3 adenomas largest 12 mm  2008 - no polyps 2013-  2 mm cecal polyp, adenoma, routine repeat colonoscopy 02/2017 approx 05/14/2017 18 polyps max 12 mm ALL adenomas recall 2019 -  [name], MD, [facility]     . Diverticulosis of colon   . DJD (degenerative joint disease)    left hip  . Full dentures    upper and lower  . GERD (gastroesophageal reflux disease)    diet controlled   . Gout    feet  . Hiatal hernia    2 cm noted on upper endos 2011   . Hx of adenomatous colonic polyps   . Hyperlipidemia   . Hypertension   .  Internal hemorrhoid   . 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 Myocardial infarction [facility]) 2004   hx of, 2 stents 2004. pt was on Plavix x 3 years then transitioned to ASA, no problems since  . Non-small cell carcinoma of right lung, stage 1 ([place]) 01/30/2016  . Renal failure, acute (HCC)    hx of 2/2 medication, resolved  . Tobacco abuse    smoker .5 ppd x 55 yrs  . UTI (lower urinary tract infection)     Family History  Problem Relation Age of Onset  . Diabetes Mother   . Hypertension Sister   . Heart disease Sister   . Diabetes Sister   . Arthritis Brother   . Gout Brother   . Colon cancer Neg Hx   . Esophageal cancer Neg Hx   . Rectal cancer Neg Hx    . Stomach cancer Neg Hx   . Colon polyps Neg Hx     SOCIAL HISTORY: Social History   Socioeconomic History  . Marital status: Single    Spouse name: Not on file  . Number of children: 0  . Years of education: Not on file  . Highest education level: Not on file  Occupational History  . Not on file  Tobacco Use  . Smoking status: Current Every Day Smoker    Packs/day: 0.50    Years: 55.00    Pack years: 27.50    Types: Cigarettes  . Smokeless tobacco: Never Used  Vaping Use  . Vaping Use: Never used  Substance and Sexual Activity  . Alcohol use: Yes    Alcohol/week: 0.0 standard drinks    Comment: drinking 1-2 pts gin/week, (mixed with tap water not tonic)  . Drug use: No  . Sexual activity: Not on file  Other Topics Concern  . Not on file  Social History Narrative  . Not on file   Social Determinants of Health   Financial Resource Strain:   . Difficulty of Paying Living Expenses: Not on file  Food Insecurity:   . Worried About Charity fundraiser in the Last Year: Not on file  . Ran Out of Food in the Last Year: Not on file  Transportation Needs:   . Lack of Transportation (Medical): Not on file  . Lack of Transportation (Non-Medical): Not on file  Physical Activity:   . Days of Exercise per Week: Not on file  . Minutes of Exercise per Session: Not on file  Stress:   . Feeling of Stress : Not on file  Social Connections:   . Frequency of Communication with Friends and Family: Not on file  . Frequency of Social Gatherings with Friends and Family: Not on file  . Attends Religious Services: Not on file  . Active Member of Clubs or Organizations: Not on file  . Attends Archivist Meetings: Not on file  . Marital Status: Not on file  Intimate Partner Violence:   . Fear of Current or Ex-Partner: Not on file  . Emotionally Abused: Not on file  . Physically Abused: Not on file  . Sexually Abused: Not on file    Allergies  Allergen Reactions  .  Candesartan Cilexetil-Hctz Other (See Comments)    REACTION: Acute renal failure  . Hydrochlorothiazide Other (See Comments)    REACTION: Acute renal failure  . Shellfish Allergy Other (See Comments)    Gout    Current Outpatient Medications  Medication  Sig Dispense Refill  . albuterol (PROVENTIL HFA;VENTOLIN HFA) 108 (90 Base) MCG/ACT inhaler Inhale 2 puffs into the lungs every 6 (six) hours as needed for wheezing or shortness of breath. (Patient not taking: Reported on 04/02/2018) 1 Inhaler 2  . allopurinol (ZYLOPRIM) 100 MG tablet Take 1 tablet (100 mg total) by mouth at bedtime. 90 tablet 3  . amLODipine (NORVASC) 10 MG tablet TAKE 1 TABLET EVERY DAY 90 tablet 1  . aspirin EC 81 MG tablet Take 81 mg by mouth at bedtime.     . docusate sodium (COLACE) 100 MG capsule Take 1 capsule (100 mg total) by mouth 2 (two) times daily as needed for mild constipation. (Patient taking differently: Take 100 mg by mouth 2 (two) times daily. ) 60 capsule 0  . HYDROcodone-acetaminophen (NORCO) 7.5-325 MG tablet Take 1 tablet by mouth 2 (two) times daily as needed for moderate pain. 60 tablet 0  . HYDROcodone-acetaminophen (NORCO) 7.5-325 MG tablet Take 1 tablet by mouth 2 (two) times daily as needed for moderate pain. 60 tablet 0  . HYDROcodone-acetaminophen (NORCO) 7.5-325 MG tablet Take 1 tablet by mouth 2 (two) times daily as needed for moderate pain. 60 tablet 0  . metoprolol succinate (TOPROL-XL) 100 MG 24 hr tablet Take 1 tablet (100 mg total) by mouth daily. Take with or immediately following a meal. 90 tablet 3  . Multiple Vitamin (MULTIVITAMIN WITH MINERALS) TABS tablet Take 1 tablet by mouth daily.    Marland Kitchen OVER THE COUNTER MEDICATION Take 1 tablet by mouth 2 (two) times daily as needed (pain). Pain medication that dissolves like alka-seltzer - from [facility]    . polyethylene glycol (MIRALAX / GLYCOLAX) packet Take 17 g by mouth daily. Take daily until you achieve soft daily bowel movements, then titrate  dosing to continue having daily soft bowel movements (Patient not taking: Reported on 03/18/2019) 30 each 0  . rosuvastatin (CRESTOR) 20 MG tablet Take 1 tablet (20 mg total) by mouth daily. 90 tablet 1  . tamsulosin (FLOMAX) 0.4 MG CAPS capsule Take 2 capsules (0.8 mg total) by mouth every evening. 180 capsule 3   No current facility-administered medications for this visit.    REVIEW OF SYSTEMS:  [X]  denotes positive finding, [ ]  denotes negative finding Cardiac  Comments:  Chest pain or chest pressure:    Shortness of breath upon exertion: x   Short of breath when lying flat: x   Irregular heart rhythm:        Vascular    Pain in calf, thigh, or hip brought on by ambulation:    Pain in feet at night that wakes you up from your sleep:     Blood clot in your veins:    Leg swelling:         Pulmonary    Oxygen at home:    Productive cough:     Wheezing:  x       Neurologic    Sudden weakness in arms or legs:     Sudden numbness in arms or legs:     Sudden onset of difficulty speaking or slurred speech:    Temporary loss of vision in one eye:     Problems with dizziness:         Gastrointestinal    Blood in stool:     Vomited blood:         Genitourinary    Burning when urinating:     Blood in urine:  Psychiatric    Major depression:         Hematologic    Bleeding problems:    Problems with blood clotting too easily:        Skin    Rashes or ulcers:        Constitutional    Fever or chills:     PHYSICAL EXAM:   Vitals:   03/15/20 1314  BP: 118/68  Pulse: 68  Resp: 20  Temp: 97.7 F (36.5 C)  SpO2: 94%  Weight: 152 lb (68.9 kg)  Height: 6' (1.829 m)    GENERAL: The patient is a well-nourished male, in no acute distress. The vital signs are documented above. CARDIAC: There is a regular rate and rhythm.  VASCULAR: I do not detect carotid bruits. On the right side he has a palpable femoral pulse.  I cannot palpate a popliteal or pedal pulses. On  the left side he has a palpable femoral and diminished popliteal pulse.  I cannot palpate pedal pulses. He has no significant lower extremity swelling. PULMONARY: There is good air exchange bilaterally without wheezing or rales. ABDOMEN: Soft and non-tender with normal pitched bowel sounds.  His aneurysm is palpable and nontender. MUSCULOSKELETAL: There are no major deformities or cyanosis. NEUROLOGIC: No focal weakness or paresthesias are detected. SKIN: There are no ulcers or rashes noted. PSYCHIATRIC: The patient has a normal affect.  DATA:    DUPLEX ABDOMINAL AORTA: I reviewed the duplex of the abdominal aorta that was done on 02/14/2020.  The maximum diameter of the aneurysm was 5.2 cm.  The right common iliac artery measured 2.2 cm in maximum diameter.  The left common iliac artery measures 1.6 cm in diameter.  I reviewed the previous ultrasound from 09/19/2017 which showed that the maximum diameter of the aneurysm was 3.9 cm.  LABS: I reviewed the labs from 02/14/2020.  Creatinine was 1.28.  GFR was greater than 60.

## 2020-03-21 ENCOUNTER — Other Ambulatory Visit: Payer: Self-pay | Admitting: *Deleted

## 2020-03-21 DIAGNOSIS — I739 Peripheral vascular disease, unspecified: Secondary | ICD-10-CM

## 2020-03-21 DIAGNOSIS — I714 Abdominal aortic aneurysm, without rupture, unspecified: Secondary | ICD-10-CM

## 2020-04-03 DIAGNOSIS — N281 Cyst of kidney, acquired: Secondary | ICD-10-CM | POA: Diagnosis not present

## 2020-04-03 DIAGNOSIS — R3914 Feeling of incomplete bladder emptying: Secondary | ICD-10-CM | POA: Diagnosis not present

## 2020-04-03 DIAGNOSIS — R8279 Other abnormal findings on microbiological examination of urine: Secondary | ICD-10-CM | POA: Diagnosis not present

## 2020-04-03 DIAGNOSIS — R3912 Poor urinary stream: Secondary | ICD-10-CM | POA: Diagnosis not present

## 2020-04-12 ENCOUNTER — Other Ambulatory Visit: Payer: Self-pay | Admitting: *Deleted

## 2020-04-12 DIAGNOSIS — Z79891 Long term (current) use of opiate analgesic: Secondary | ICD-10-CM

## 2020-04-12 DIAGNOSIS — M25552 Pain in left hip: Secondary | ICD-10-CM

## 2020-04-12 DIAGNOSIS — M25551 Pain in right hip: Secondary | ICD-10-CM

## 2020-04-12 MED ORDER — HYDROCODONE-ACETAMINOPHEN 7.5-325 MG PO TABS: 1.0000 | ORAL_TABLET | Freq: Two times a day (BID) | ORAL | 0 refills | Status: DC | PRN

## 2020-04-12 NOTE — Telephone Encounter (Signed)
Requesting refill on Hydrocodone.

## 2020-05-15 ENCOUNTER — Other Ambulatory Visit: Payer: Self-pay

## 2020-05-15 ENCOUNTER — Ambulatory Visit (INDEPENDENT_AMBULATORY_CARE_PROVIDER_SITE_OTHER): Payer: Medicare HMO | Admitting: Student in an Organized Health Care Education/Training Program

## 2020-05-15 ENCOUNTER — Encounter: Payer: Self-pay | Admitting: Student in an Organized Health Care Education/Training Program

## 2020-05-15 VITALS — BP 101/58 | HR 53 | Temp 97.8°F | Ht 72.0 in | Wt 157.4 lb

## 2020-05-15 DIAGNOSIS — Z23 Encounter for immunization: Secondary | ICD-10-CM

## 2020-05-15 DIAGNOSIS — M25551 Pain in right hip: Secondary | ICD-10-CM | POA: Diagnosis not present

## 2020-05-15 DIAGNOSIS — M25552 Pain in left hip: Secondary | ICD-10-CM

## 2020-05-15 DIAGNOSIS — R339 Retention of urine, unspecified: Secondary | ICD-10-CM

## 2020-05-15 DIAGNOSIS — I714 Abdominal aortic aneurysm, without rupture, unspecified: Secondary | ICD-10-CM

## 2020-05-15 DIAGNOSIS — Z0000 Encounter for general adult medical examination without abnormal findings: Secondary | ICD-10-CM

## 2020-05-15 DIAGNOSIS — Z79891 Long term (current) use of opiate analgesic: Secondary | ICD-10-CM

## 2020-05-15 MED ORDER — HYDROCODONE-ACETAMINOPHEN 7.5-325 MG PO TABS: 1.0000 | ORAL_TABLET | Freq: Two times a day (BID) | ORAL | 0 refills | Status: DC | PRN

## 2020-05-15 NOTE — Assessment & Plan Note (Signed)
Patient with a growing infrarenal abdominal aortic aneurysm, now 5.2 cm on last ultrasound.  Being managed conservatively by Dr. Scot Dock.  On medical therapy with aspirin 81 mg daily.  Blood pressure is under excellent control today.  Still continues to smoke cigarettes on a daily basis, working on cutting back, I have offered him tobacco cessation medication but he has declined.  Planning for repeat ultrasound in March, if the aneurysm grows to above 5.5 cm it would likely necessitate some kind of surgical intervention.  No new symptoms today.

## 2020-05-15 NOTE — Assessment & Plan Note (Signed)
Stable symptoms of urinary retention, has been a chronic issue.  Being managed by Dr. Matilde Sprang with urology.  Planning for medical therapy with tamsulosin, follow-up ultrasounds, possibly urodynamic testing in the future.  No signs of worsening symptoms today to necessitate any change in this plan.

## 2020-05-15 NOTE — Assessment & Plan Note (Signed)
Overall health is frail with declining functional status and accumulating chronic diseases including significant vascular disease owing to tobacco use.  He certainly is at risk for poor outcome despite our best efforts at medically managing.  Overall though he is grateful for his independence and current functional status.  Also has a relatively low medication burden.  Given the flu shot today and arrange for him to have a Covid vaccine booster at his local pharmacy.

## 2020-05-15 NOTE — Patient Instructions (Addendum)
It was great seeing you today in the clinic.  Please continue all your medications as they are prescribed.  Keep working on cutting back your tobacco use.  This is really important to prevent worsening of your aorta disease.  We gave you a flu shot today in the clinic and help to [facility]  Please follow-up with me in 3 months.  Call me if you have any new symptoms like abdominal pain, difficulty urinating, fevers, or pain while urinating.

## 2020-05-15 NOTE — Assessment & Plan Note (Signed)
Chronic pain generator is osteoarthritis of his hip.  He finds good functional benefit from using moderate dosing hydrocodone twice daily.  No falls or other adverse effects.  I reviewed the database which was appropriate.  Plan to continue with hydrocodone twice daily, I sent 3 months refills to his pharmacy.

## 2020-05-15 NOTE — Progress Notes (Signed)
Assessment and Plan:  See Encounters tab for problem-based medical decision making.   __________________________________________________________  HPI:   74 year old person living with coronary artery disease, tobacco use disorder, osteoarthritis with chronic pain here for follow-up of these problems.  I last saw the patient 3 months ago.  At that time we identified growth in his known abdominal aortic aneurysm up to 5.2 cm.  He was able to quickly follow-up with Dr. Scot Dock with vascular surgery, because he was without significant symptoms they decided to manage medically for now, repeat the ultrasound in 6 months, and if the aneurysm grows greater than 5.5 cm to discuss surgical intervention for it.  Patient has been satisfied with this approach, he really has no symptoms from the abdominal aortic aneurysm at this time.  Eating and drinking well.  No abdominal pain.  No chest pain.  Some dyspnea on exertion, which is a longstanding issue for him.  Continues to smoke cigarettes, 1 pack will last him about 2-1/2 days.  Working on cutting back, not interested in medication assistance.  No recent illnesses, no fevers or chills.  At last visit we also identified urinary retention on our ultrasound.  He had greater than 400 mL on a post void reading.  He is also fairly asymptomatic from the standpoint.  He was able to follow-up with his urologist, because he was having minimal symptoms they also decided to take a conservative/medical approach for now.  He is on high-dose Flomax and reports good urinary stream.  Says he is urinating more often than he would like.  Denies a sensation of lower abdominal pain, denies dribbling or urinary incontinence.  No dysuria.  Their plan is to rule out hydronephrosis with renal ultrasounds, I do not see this completed in our system.  He is also going to follow-up with the urology office in early January.  If he were to develop hydronephrosis as a result of the urinary  retention or develop more symptoms, they would move onto urodynamic testing with possible surgical interventions depending on the results.  Chronic pain is stable.  He has chronic osteoarthritis of both hips pain with reports of good functional status at home.  Lives by himself.  Reports good adherence to his medications with no adverse side effects.  Denies any recent falls.  Fairly isolated at this point, not much family or support.  No depressed mood, overall seems to be satisfied with his quality of life and functional status.  Independent in all activities of daily living.  __________________________________________________________  Problem List: Patient Active Problem List   Diagnosis Date Noted  . Chronic use of opiate for therapeutic purpose 05/01/2015    Priority: High  . CAD (coronary artery disease) 03/17/2012    Priority: High  . Tobacco use disorder 04/18/2006    Priority: High  . Urinary retention 04/02/2018    Priority: Medium  . Mild alcohol use disorder 12/09/2016    Priority: Medium  . Atherosclerosis of aorta ([place]) 01/13/2015    Priority: Medium  . Prediabetes 12/30/2014    Priority: Medium  . AAA (abdominal aortic aneurysm) ([place])     Priority: Medium  . CKD (chronic kidney disease) stage 2, GFR 60-89 ml/min 08/07/2007    Priority: Medium  . Essential hypertension 04/18/2006    Priority: Medium  . GERD 04/18/2006    Priority: Medium  . Premature atrial contractions 07/31/2015    Priority: Low  . Healthcare maintenance 06/30/2012    Priority: Low  .  Erectile dysfunction 10/12/2010    Priority: Low  . Arthropathy of pelvic region and thigh 05/02/2008    Priority: Low  . 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    Priority: Low  . Gout 04/18/2006    Priority: Low  . Microscopic hematuria 02/15/2019    Medications: Reconciled today in Epic __________________________________________________________  Physical Exam:  Vital Signs: Vitals:   05/15/20 0827   BP: (!) 101/58  Pulse: (!) 53  Temp: 97.8 F (36.6 C)  TempSrc: Oral  SpO2: 100%  Weight: 157 lb 6.4 oz (71.4 kg)  Height: 6' (1.829 m)    Gen: Well appearing, NAD Neck: No cervical LAD, No thyromegaly or nodules, No JVD. CV: RRR, no murmurs Pulm: Normal effort, CTA throughout, no wheezing Abd: Soft, NT, ND, stable pulsatile mass in the mid abdomen. Ext: Warm, no edema

## 2020-06-13 ENCOUNTER — Telehealth: Payer: Self-pay

## 2020-06-13 ENCOUNTER — Other Ambulatory Visit: Payer: Self-pay | Admitting: *Deleted

## 2020-06-13 DIAGNOSIS — Z79891 Long term (current) use of opiate analgesic: Secondary | ICD-10-CM

## 2020-06-13 DIAGNOSIS — M25552 Pain in left hip: Secondary | ICD-10-CM

## 2020-06-13 MED ORDER — HYDROCODONE-ACETAMINOPHEN 7.5-325 MG PO TABS: 1.0000 | ORAL_TABLET | Freq: Two times a day (BID) | ORAL | 0 refills | Status: DC | PRN

## 2020-06-13 NOTE — Telephone Encounter (Signed)
Is was done this am, confirmed w/ meredith at [facility]

## 2020-06-13 NOTE — Telephone Encounter (Signed)
HYDROcodone-acetaminophen (NORCO) 7.5-325 MG tablet, refill request @  [id number] [place] [place] [facility] [address] Phone:  [phone]  Fax:  [phone]

## 2020-07-14 ENCOUNTER — Telehealth: Payer: Self-pay | Admitting: Student in an Organized Health Care Education/Training Program

## 2020-07-14 NOTE — Telephone Encounter (Signed)
Refill Request  HYDROcodone-acetaminophen (NORCO) 7.5-325 MG tablet  [id number] [place] [place] (Ph: [phone])

## 2020-07-14 NOTE — Telephone Encounter (Signed)
3 RX's sent on 05/15/20.  [facility] and [facility] and will get this ready for him to pick up. SChaplin, RN,BSN

## 2020-07-22 ENCOUNTER — Other Ambulatory Visit: Payer: Self-pay | Admitting: Student in an Organized Health Care Education/Training Program

## 2020-08-14 ENCOUNTER — Ambulatory Visit (INDEPENDENT_AMBULATORY_CARE_PROVIDER_SITE_OTHER): Payer: Medicare HMO | Admitting: Student in an Organized Health Care Education/Training Program

## 2020-08-14 ENCOUNTER — Encounter: Payer: Self-pay | Admitting: Student in an Organized Health Care Education/Training Program

## 2020-08-14 VITALS — BP 109/66 | HR 55 | Temp 97.7°F | Ht 72.0 in | Wt 151.9 lb

## 2020-08-14 DIAGNOSIS — M25552 Pain in left hip: Secondary | ICD-10-CM

## 2020-08-14 DIAGNOSIS — I714 Abdominal aortic aneurysm, without rupture, unspecified: Secondary | ICD-10-CM

## 2020-08-14 DIAGNOSIS — F1010 Alcohol abuse, uncomplicated: Secondary | ICD-10-CM | POA: Diagnosis not present

## 2020-08-14 DIAGNOSIS — R339 Retention of urine, unspecified: Secondary | ICD-10-CM

## 2020-08-14 DIAGNOSIS — Z79891 Long term (current) use of opiate analgesic: Secondary | ICD-10-CM | POA: Diagnosis not present

## 2020-08-14 DIAGNOSIS — M25551 Pain in right hip: Secondary | ICD-10-CM

## 2020-08-14 DIAGNOSIS — R634 Abnormal weight loss: Secondary | ICD-10-CM | POA: Diagnosis not present

## 2020-08-14 MED ORDER — HYDROCODONE-ACETAMINOPHEN 7.5-325 MG PO TABS: 1.0000 | ORAL_TABLET | Freq: Two times a day (BID) | ORAL | 0 refills | Status: DC | PRN

## 2020-08-14 NOTE — Assessment & Plan Note (Signed)
Patient has a 5.2 cm infrarenal abdominal aortic aneurysm that has been slowly growing since 2014.  Continues to have some tobacco use, blood pressure is well controlled on medications.  He has a repeat ultrasound scheduled for March 16.  Plan to continue with aspirin 81 mg daily and rosuvastatin 20 mg daily.  Follow-up with Dr. Scot Dock in the vascular surgery clinic after the ultrasound.  If the aneurysm grows greater to greater than 5.5 cm a surgery may be indicated.

## 2020-08-14 NOTE — Assessment & Plan Note (Signed)
Continues to have urinary retention, on post void examination today with a point-of-care ultrasound he still had significant bladder distention.  This has been a chronic issue for many years, never shown to have hydronephrosis.  We will check a BMP today to monitor renal function.  This is being managed by [facility], patient is very resistant to Foley catheterization or invasive procedures.  Plan to continue with tamsulosin 20 mg daily.

## 2020-08-14 NOTE — Assessment & Plan Note (Signed)
Current weight is 151 pounds with a BMI of 20.  Chart review shows that he has lost about 20 pounds over the last 1 year unintentionally.  Good access to food, even uses Ensure to supplement his protein.  He has a history of a stage I non-small cell lung cancer managed surgically in 2008.  Still has ongoing tobacco use, so he is at risk for another lung cancer.  Plan will be for a CT chest with contrast to evaluate for lung cancer as the cause.  Will check CMP, CBC, TSH, LDH today as well.  We talked about the need to quit smoking and reduce alcohol intake in hopes of improving his nutrition.

## 2020-08-14 NOTE — Assessment & Plan Note (Signed)
Stable chronic use of low-dose opioids for management of chronic musculoskeletal pain, especially hip osteoarthritis.  Continues to find the hydrocodone to be helpful in improving his functional status.  Denies any falls or other side effects.  Plan to continue with hydrocodone 7.5 mg twice daily.  I reviewed the database which was appropriate.  I have sent a 32-month supply of refills to his pharmacy.

## 2020-08-14 NOTE — Progress Notes (Signed)
   Assessment and Plan:  See Encounters tab for problem-based medical decision making.   __________________________________________________________  HPI:   75 year old person here for follow-up of chronic hip osteoarthritis, hypertension.  Seems to be doing somewhat poorly in the last few months.  Stable exertional capacity and functional status, lives on his own and is independent in his activities of daily living.  Manages his own medications, denies any recent side effects.  He is a few active medical problems right now including a growing abdominal aortic aneurysm, scheduled for follow-up ultrasounds from March 16.  He also has a worsening urinary retention post likely due to BPH.  He continues to pass urine fairly easily, he thinks he is going to the bathroom too much, denies any lower abdominal pain, no fevers, no dysuria.  No sensation of inability to void.  Reports pain is well controlled, says the hydrocodone has been helpful in improving his functional status.  Allows him to walk around, go to the store, complete other chores.  Denies any falls.  He does endorse weight loss over the last few months.  Reports his appetite is just okay, says he drinks Ensure to try to supplement his calories.  Reports good access to food.  Has not had any alcohol in the past 3 days, says that he has cut back on his drinking significantly.  Also cut back on cigarette use, now only smoking about 5 cigarettes/day.  __________________________________________________________  Problem List: Patient Active Problem List   Diagnosis Date Noted  . Chronic use of opiate for therapeutic purpose 05/01/2015    Priority: High  . CAD (coronary artery disease) 03/17/2012    Priority: High  . Tobacco use disorder 04/18/2006    Priority: High  . Urinary retention 04/02/2018    Priority: Medium  . Mild alcohol use disorder 12/09/2016    Priority: Medium  . Atherosclerosis of aorta ([place]) 01/13/2015    Priority: Medium   . Prediabetes 12/30/2014    Priority: Medium  . AAA (abdominal aortic aneurysm) ([place])     Priority: Medium  . CKD (chronic kidney disease) stage 2, GFR 60-89 ml/min 08/07/2007    Priority: Medium  . Essential hypertension 04/18/2006    Priority: Medium  . GERD 04/18/2006    Priority: Medium  . Premature atrial contractions 07/31/2015    Priority: Low  . Healthcare maintenance 06/30/2012    Priority: Low  . Erectile dysfunction 10/12/2010    Priority: Low  . Arthropathy of pelvic region and thigh 05/02/2008    Priority: Low  . 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    Priority: Low  . Gout 04/18/2006    Priority: Low  . Unintentional weight loss 08/14/2020    Medications: Reconciled today in Epic __________________________________________________________  Physical Exam:  Vital Signs: Vitals:   08/14/20 1031  BP: 109/66  Pulse: (!) 55  Temp: 97.7 F (36.5 C)  TempSrc: Oral  SpO2: 100%  Weight: 151 lb 14.4 oz (68.9 kg)  Height: 6' (1.829 m)    Gen: Thin, chronically ill-appearing man, no distress Neck: No cervical LAD, No thyromegaly or nodules, No JVD. CV: RRR, no murmurs Pulm: Normal effort, CTA throughout, no wheezing Abd: Soft, NT, ND, there is a pulsating mass in his mid abdomen about 5 cm consistent with his known abdominal aortic aneurysm. Ext: Warm, no edema, normal joints

## 2020-08-15 ENCOUNTER — Telehealth: Payer: Self-pay

## 2020-08-15 LAB — CMP14 + ANION GAP
ALT: 24 IU/L (ref 0–44)
AST: 55 IU/L — ABNORMAL HIGH (ref 0–40)
Albumin/Globulin Ratio: 0.8 — ABNORMAL LOW (ref 1.2–2.2)
Albumin: 3.2 g/dL — ABNORMAL LOW (ref 3.7–4.7)
Alkaline Phosphatase: 89 IU/L (ref 44–121)
Anion Gap: 15 mmol/L (ref 10.0–18.0)
BUN/Creatinine Ratio: 13 (ref 10–24)
BUN: 11 mg/dL (ref 8–27)
Bilirubin Total: 0.7 mg/dL (ref 0.0–1.2)
CO2: 24 mmol/L (ref 20–29)
Calcium: 9.6 mg/dL (ref 8.6–10.2)
Chloride: 103 mmol/L (ref 96–106)
Creatinine, Ser: 0.87 mg/dL (ref 0.76–1.27)
Globulin, Total: 3.8 g/dL (ref 1.5–4.5)
Glucose: 87 mg/dL (ref 65–99)
Potassium: 3.8 mmol/L (ref 3.5–5.2)
Sodium: 142 mmol/L (ref 134–144)
Total Protein: 7 g/dL (ref 6.0–8.5)
eGFR: 91 mL/min/{1.73_m2} (ref >59)

## 2020-08-15 LAB — CBC
Hematocrit: 37.3 % — ABNORMAL LOW (ref 37.5–51.0)
Hemoglobin: 12.8 g/dL — ABNORMAL LOW (ref 13.0–17.7)
MCH: 34.2 pg — ABNORMAL HIGH (ref 26.6–33.0)
MCHC: 34.3 g/dL (ref 31.5–35.7)
MCV: 100 fL — ABNORMAL HIGH (ref 79–97)
Platelets: 100 10*3/uL — CL (ref 150–450)
RBC: 3.74 x10E6/uL — ABNORMAL LOW (ref 4.14–5.80)
RDW: 13 % (ref 11.6–15.4)
WBC: 4.8 10*3/uL (ref 3.4–10.8)

## 2020-08-15 LAB — TSH: TSH: 1.66 u[IU]/mL (ref 0.450–4.500)

## 2020-08-15 LAB — HIV ANTIBODY (ROUTINE TESTING W REFLEX): HIV Screen 4th Generation wRfx: NONREACTIVE

## 2020-08-15 LAB — LACTATE DEHYDROGENASE: LDH: 149 IU/L (ref 121–224)

## 2020-08-15 NOTE — Telephone Encounter (Signed)
Hi Dr. Evette Doffing,  Received Critical results value for CBC-platelets from 08/14/20 collection:  Platelets 100   Please advise. Thank you, Higinio Roger, RN,BSN

## 2020-08-15 NOTE — Telephone Encounter (Signed)
RN received secure chat with Dr. Evette Doffing at [phone] verifying MD received critical platelet value. SChaplin, RN,BSN

## 2020-08-21 ENCOUNTER — Encounter: Payer: Self-pay | Admitting: Student in an Organized Health Care Education/Training Program

## 2020-08-22 IMAGING — CT CT RENAL STONE PROTOCOL
2 of 4 series · 16 of 46 positions shown, 18 images · non-contrast
Comparison: 03/09/2015 CT abdomen and pelvis. 09/19/2017 abdominal
ultrasound

CLINICAL DATA: 72 y/o M; lower abdominal pain, loss of appetite,
constipation, and dysuria for 2 days.

EXAM:
CT ABDOMEN AND PELVIS WITHOUT CONTRAST
TECHNIQUE: Multidetector CT imaging of the abdomen and pelvis was performed
following the standard protocol without IV contrast.

[Series 3: renal stone 5.0 · axial · 0.81mm/px · z∈[+994,+1464]mm · 13 of 104 slices shown, 15 images]
[im 5/104  soft-tissue]
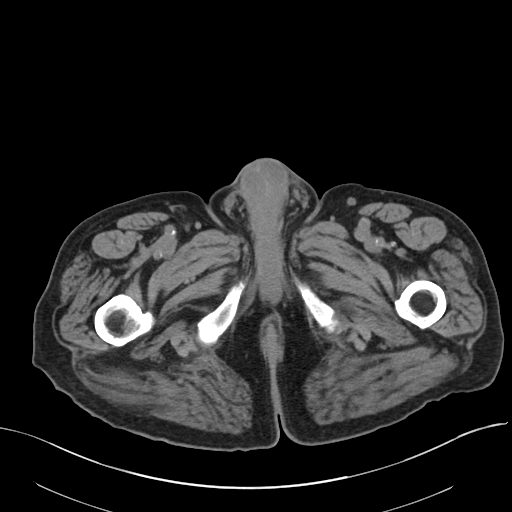
[im 5/104  bone]
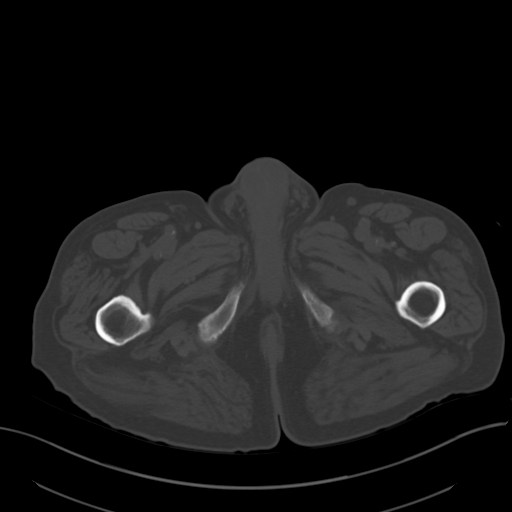
[im 13/104  soft-tissue]
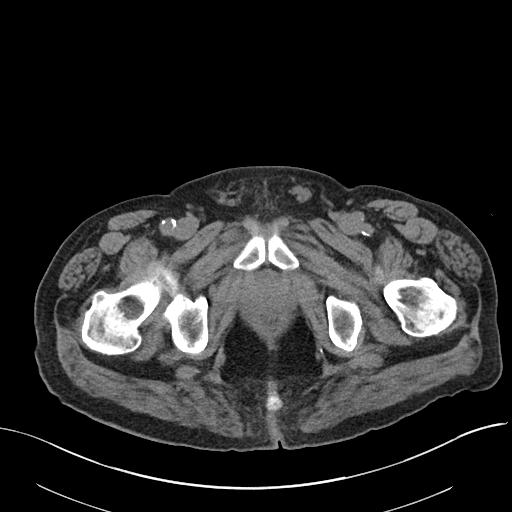
[im 22/104  soft-tissue]
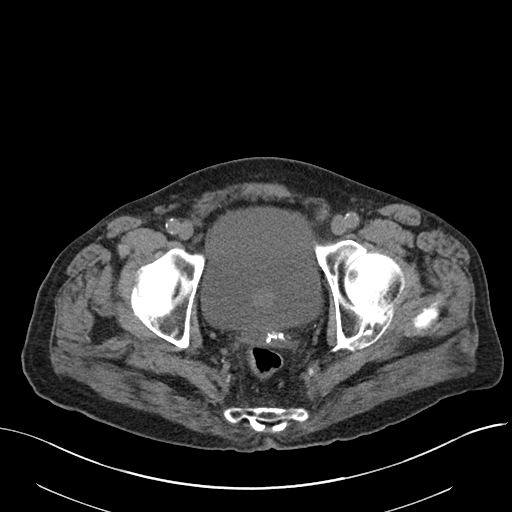
[im 31/104  soft-tissue]
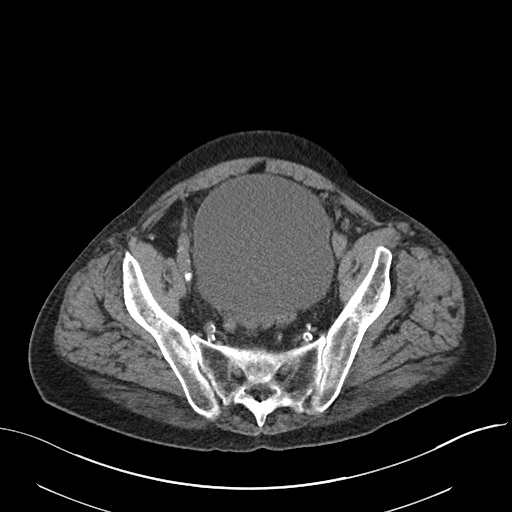
[im 35/104  soft-tissue]
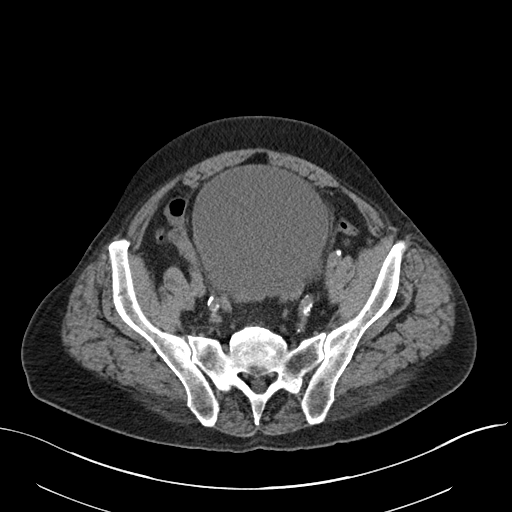
[im 43/104  soft-tissue]
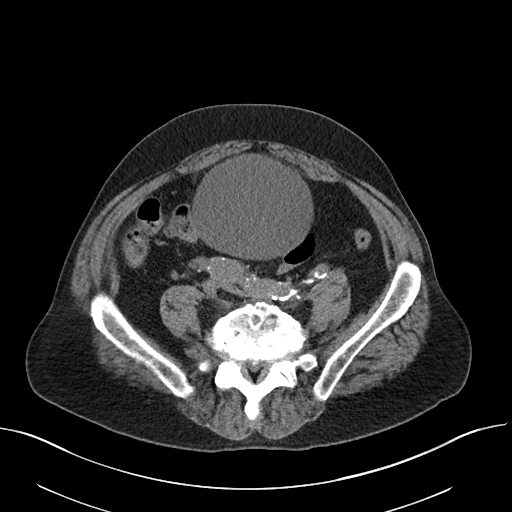
[im 52/104  soft-tissue]
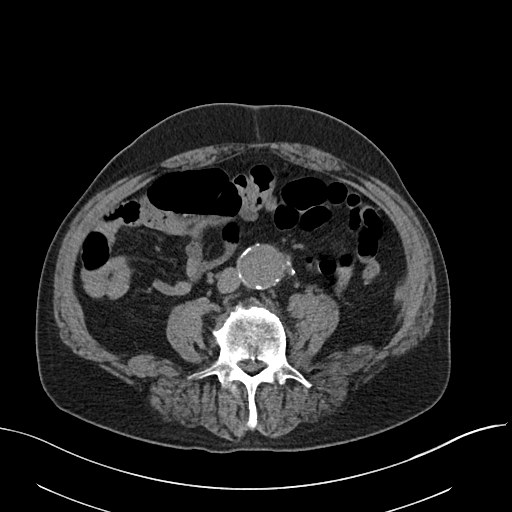
[im 61/104  soft-tissue]
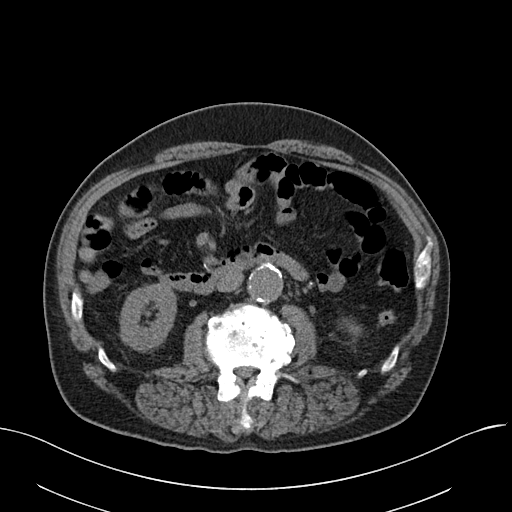
[im 69/104  soft-tissue]
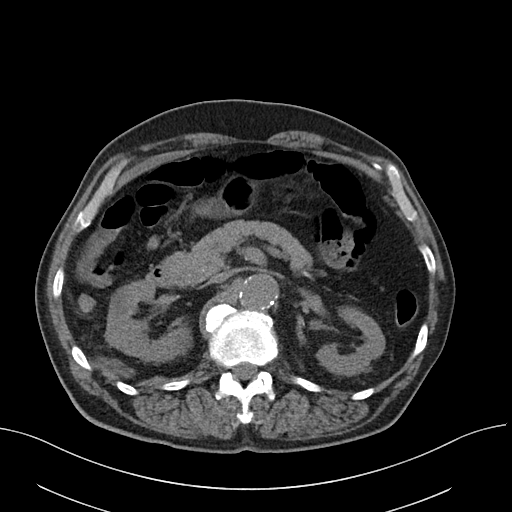
[im 69/104  bone]
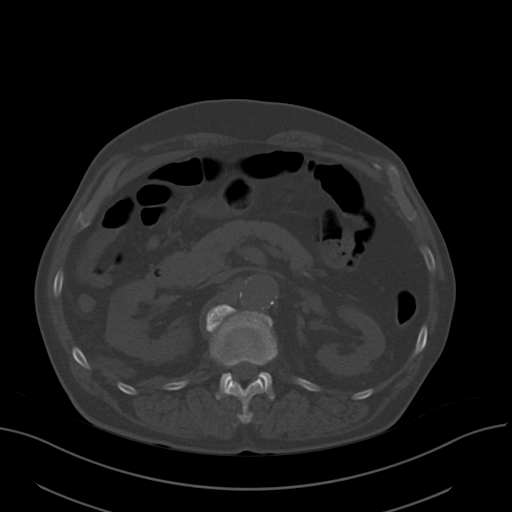
[im 73/104  soft-tissue]
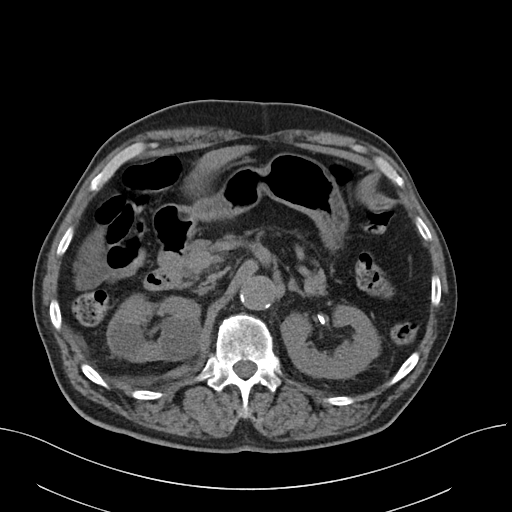
[im 82/104  soft-tissue]
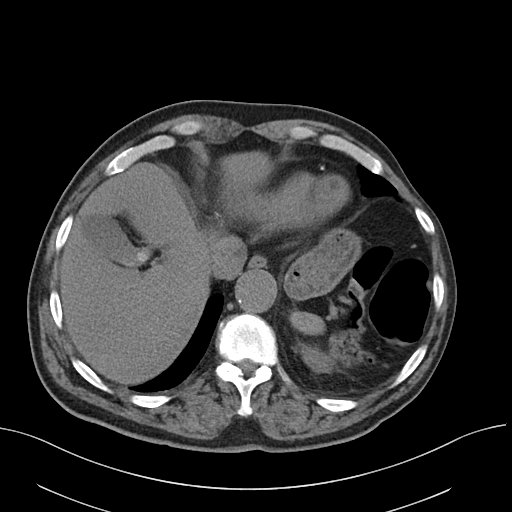
[im 91/104  soft-tissue]
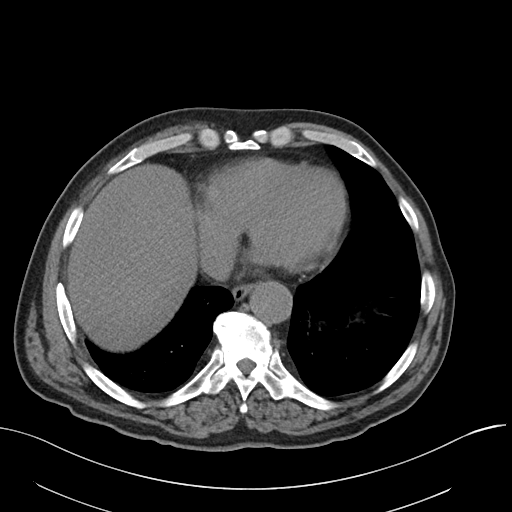
[im 99/104  soft-tissue]
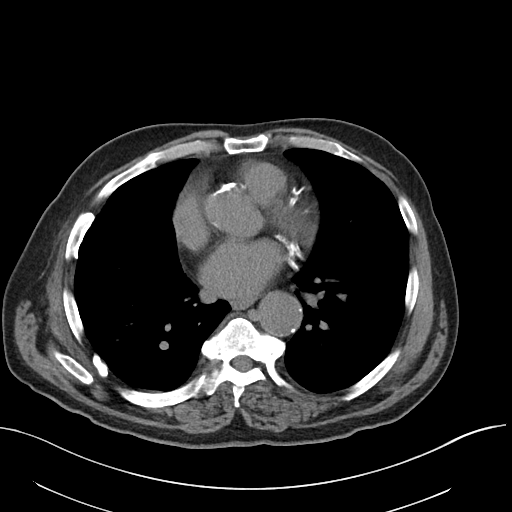

[Series 6: renal stone 3.0 cor · coronal · 0.90mm/px · 3 of 93 slices shown]
[im 31/93  soft-tissue]
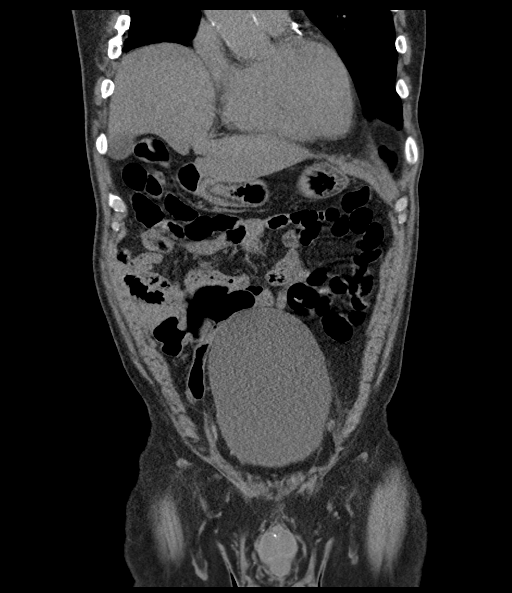
[im 41/93  soft-tissue]
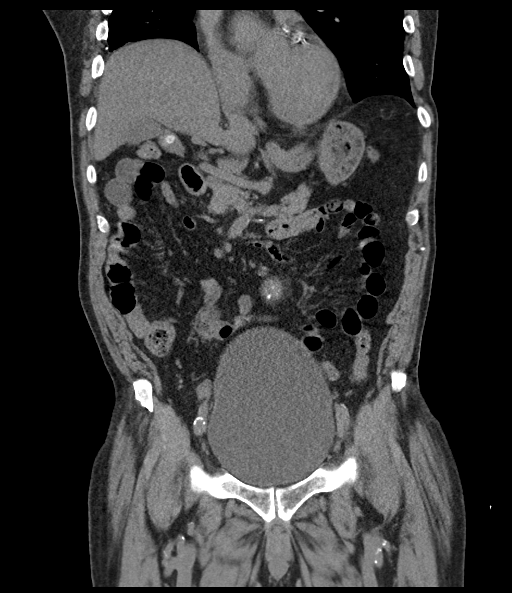
[im 52/93  soft-tissue]
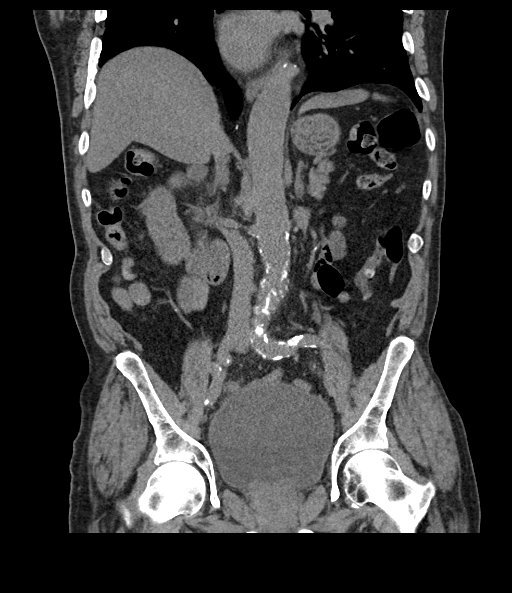

[16 of 46 positions shown; findings below may reference images not displayed]

FINDINGS: Lower chest: Chronic right thoracotomy defect of the lower ribs.
Severe coronary artery calcific atherosclerosis.

Hepatobiliary: No focal liver lesion. Cholelithiasis. No gallbladder
wall thickening or biliary ductal dilatation.

Pancreas: Faint calcifications in the pancreatic head may reflect
sequelae of prior pancreatitis.. No pancreatic ductal dilatation or
surrounding inflammatory changes.

Spleen: Normal in size without focal abnormality.

Adrenals/Urinary Tract: Normal adrenal glands. Four stable simple
cysts in the right kidney, the largest decreased in size measuring
3.6 cm, previously 4.2 cm. Again seen are isodense exophytic lesions
in the upper pole of the left kidney which are stable from prior
studies and compatible with hemorrhagic cyst. No hydronephrosis or
urinary stone disease. Distended bladder 9.5 x 11.5 x 17.8 cm
(volume = 5111 cm^3).

Stomach/Bowel: Stomach is within normal limits. Appendix appears
normal. No evidence of bowel wall thickening, distention, or
inflammatory changes. Mild colonic diverticulosis.

Vascular/Lymphatic: Infrarenal abdominal aortic aneurysm measures
3.8 x 3.9 Cm in the axial plane (series 3, image 52). Which is
increased in size from prior studies.

Reproductive: Severe prostate enlargement a lobulated convex margin
extending into the floor of bladder.

Other: No abdominal wall hernia or abnormality. No abdominopelvic
ascites.

Musculoskeletal: Transitional L5 vertebral body with partial
sacralization. Multilevel disc and facet degenerative changes of the
lumbar spine. No acute osseous abnormality is evident.
IMPRESSION: 1. Distended bladder, 5111 cc. Severe prostate enlargement.
2. 3.9 cm infrarenal abdominal aortic aneurysm, stable from prior
ultrasound given differences in technique. Follow-up as per
ultrasound recommendations.
3. Cholelithiasis.
4. Mild colonic diverticulosis without evidence for diverticulitis.
5. Aortic and coronary artery calcific atherosclerosis.

By: Ania Zbyszek Haczyk M.D.

## 2020-08-26 ENCOUNTER — Other Ambulatory Visit: Payer: Self-pay | Admitting: Student in an Organized Health Care Education/Training Program

## 2020-08-29 ENCOUNTER — Ambulatory Visit (HOSPITAL_COMMUNITY)
Admission: RE | Admit: 2020-08-29 | Discharge: 2020-08-29 | Disposition: A | Discharge status: Home / Self Care | Payer: Medicare HMO | Source: Ambulatory Visit | Attending: Student in an Organized Health Care Education/Training Program | Admitting: Student in an Organized Health Care Education/Training Program

## 2020-08-29 ENCOUNTER — Other Ambulatory Visit: Payer: Self-pay

## 2020-08-29 DIAGNOSIS — K760 Fatty (change of) liver, not elsewhere classified: Secondary | ICD-10-CM | POA: Insufficient documentation

## 2020-08-29 DIAGNOSIS — N200 Calculus of kidney: Secondary | ICD-10-CM | POA: Diagnosis not present

## 2020-08-29 DIAGNOSIS — R634 Abnormal weight loss: Secondary | ICD-10-CM | POA: Diagnosis not present

## 2020-08-29 DIAGNOSIS — K8020 Calculus of gallbladder without cholecystitis without obstruction: Secondary | ICD-10-CM | POA: Insufficient documentation

## 2020-08-29 DIAGNOSIS — I2510 Atherosclerotic heart disease of native coronary artery without angina pectoris: Secondary | ICD-10-CM | POA: Insufficient documentation

## 2020-08-29 DIAGNOSIS — J439 Emphysema, unspecified: Secondary | ICD-10-CM | POA: Insufficient documentation

## 2020-08-29 DIAGNOSIS — J432 Centrilobular emphysema: Secondary | ICD-10-CM | POA: Diagnosis not present

## 2020-08-29 DIAGNOSIS — Z85118 Personal history of other malignant neoplasm of bronchus and lung: Secondary | ICD-10-CM | POA: Diagnosis not present

## 2020-08-29 DIAGNOSIS — I700 Atherosclerosis of aorta: Secondary | ICD-10-CM | POA: Diagnosis not present

## 2020-08-29 MED ORDER — IOHEXOL 300 MG/ML  SOLN
75.0000 mL | Freq: Once | INTRAMUSCULAR | Status: AC | PRN
  Administered 2020-08-29: 75 mL via INTRAVENOUS

## 2020-08-30 ENCOUNTER — Ambulatory Visit (INDEPENDENT_AMBULATORY_CARE_PROVIDER_SITE_OTHER)
Admission: RE | Admit: 2020-08-30 | Discharge: 2020-08-30 | Disposition: A | Discharge status: Home / Self Care | Payer: Medicare HMO | Source: Ambulatory Visit | Attending: Vascular Surgery | Admitting: Vascular Surgery

## 2020-08-30 ENCOUNTER — Ambulatory Visit (INDEPENDENT_AMBULATORY_CARE_PROVIDER_SITE_OTHER): Payer: Medicare HMO | Admitting: Vascular Surgery

## 2020-08-30 ENCOUNTER — Other Ambulatory Visit (HOSPITAL_COMMUNITY): Payer: Medicare HMO

## 2020-08-30 ENCOUNTER — Encounter (HOSPITAL_COMMUNITY): Payer: Medicare HMO

## 2020-08-30 ENCOUNTER — Ambulatory Visit: Payer: Medicare HMO | Admitting: Vascular Surgery

## 2020-08-30 ENCOUNTER — Encounter: Payer: Self-pay | Admitting: Vascular Surgery

## 2020-08-30 ENCOUNTER — Ambulatory Visit (HOSPITAL_COMMUNITY)
Admission: RE | Admit: 2020-08-30 | Discharge: 2020-08-30 | Disposition: A | Discharge status: Home / Self Care | Payer: Medicare HMO | Source: Ambulatory Visit | Attending: Vascular Surgery | Admitting: Vascular Surgery

## 2020-08-30 VITALS — BP 117/70 | HR 59 | Temp 97.3°F | Resp 20 | Ht 72.0 in | Wt 147.5 lb

## 2020-08-30 DIAGNOSIS — I714 Abdominal aortic aneurysm, without rupture, unspecified: Secondary | ICD-10-CM

## 2020-08-30 DIAGNOSIS — I739 Peripheral vascular disease, unspecified: Secondary | ICD-10-CM

## 2020-08-30 NOTE — Progress Notes (Signed)
REASON FOR VISIT:   Follow-up of abdominal aortic aneurysm and peripheral vascular disease.  MEDICAL ISSUES:   ABDOMINAL AORTIC ANEURYSM: This patient's abdominal aortic aneurysm remains stable in size at 5.2 cm.  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  I have explained that in a normal risk patient we would consider elective repair at 5.5 cm.  If his aneurysm enlarges significantly he would need a CT scan to further evaluate his aneurysm and consider open versus endovascular repair.  We have again discussed the importance of tobacco cessation and tight blood pressure control.  His blood pressure is under good control.  He understands that continued tobacco use does increase his risk of aneurysm expansion and rupture.  I will see him back in 6 months.  He knows to call sooner if he has problems.  PERIPHERAL VASCULAR DISEASE: He has minimal claudication symptoms in both calves.  He has no rest pain.  He does continue to smoke.  We have again discussed the importance of tobacco cessation.  He is on aspirin and is on a statin.  I think his activity is mostly limited by his shortness of breath.  His ABIs showed multiphasic flow bilaterally with normal ABIs.  I do not think he needs routine follow-up unless he develops worsening symptoms.   HPI:   [name] is a pleasant 75 y.o. male who I last saw on 03/15/2020.  He had a 5.2 cm infrarenal abdominal aortic aneurysm.  We talked about the importance of tobacco cessation and good blood pressure control.  If the aneurysm enlarge significantly I explained he would need a CT angiogram.  He had a history of some mild chronic kidney disease but in August his GFR was normal.  He also had evidence of peripheral vascular disease.  He had claudication but no rest pain.  I ordered follow-up ABIs when he returns.  Since I saw him last he has some mild calf claudication.  However his activity is mostly limited by his shortness of  breath.  I noted that he had a CT angiogram done yesterday of the chest.  He tells me that his primary care physician ordered this because of his shortness of breath.  This is not yet been read by radiology.  I reviewed his thoracic aorta which measures 3.6 cm in maximum diameter.  He said no chest pain.  He denies any abdominal pain or back pain.  He does have a history of coronary artery disease and PACs but has had no recent chest pain.  He continues to smoke.  He is cut back to about a quarter of a pack per day.  Past Medical History:  Diagnosis Date  . AAA (abdominal aortic aneurysm) ([facility])    noted CT 11/2012 size 3.3 x 3.2 cm  . Alcohol abuse   . Barretts esophagus    bx distal esophagus 2011 neg   . Bladder outlet obstruction 04/02/2018  . Cataract   . Cataract   . CHF (congestive heart failure) ([place])   . Cholelithiasis   . Chronic osteomyelitis involving ankle and foot, right ([place]) 07/13/2018  . 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   2006 - 3 adenomas largest 12 mm  2008 - no polyps 2013-  2 mm cecal polyp, adenoma, routine repeat colonoscopy 02/2017 approx 05/14/2017 18 polyps max 12 mm ALL adenomas recall 2019 -  [name], MD, [facility]     . Diverticulosis of colon   .  DJD (degenerative joint disease)    left hip  . Full dentures    upper and lower  . GERD (gastroesophageal reflux disease)    diet controlled   . Gout    feet  . Hiatal hernia    2 cm noted on upper endos 2011   . Hx of adenomatous colonic polyps   . Hyperlipidemia   . Hypertension   . Internal hemorrhoid   . 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 Myocardial infarction [facility]) 2004   hx of, 2 stents 2004. pt was on Plavix x 3 years then transitioned to ASA, no problems since  . Non-small cell carcinoma of right lung, stage 1 ([place]) 01/30/2016  . Renal failure, acute ([facility])    hx of 2/2 medication, resolved  . Tobacco abuse    smoker .5 ppd x 55 yrs  .  UTI (lower urinary tract infection)     Family History  Problem Relation Age of Onset  . Diabetes Mother   . Hypertension Sister   . Heart disease Sister   . Diabetes Sister   . Arthritis Brother   . Gout Brother   . Colon cancer Neg Hx   . Esophageal cancer Neg Hx   . Rectal cancer Neg Hx   . Stomach cancer Neg Hx   . Colon polyps Neg Hx     SOCIAL HISTORY: Social History   Tobacco Use  . Smoking status: Current Every Day Smoker    Packs/day: 0.25    Years: 55.00    Pack years: 13.75    Types: Cigarettes  . Smokeless tobacco: Never Used  Substance Use Topics  . Alcohol use: Yes    Alcohol/week: 0.0 standard drinks    Comment: drinking 1-2 pts gin/week, (mixed with tap water not tonic)    Allergies  Allergen Reactions  . Candesartan Cilexetil-Hctz Other (See Comments)    REACTION: Acute renal failure  . Hydrochlorothiazide Other (See Comments)    REACTION: Acute renal failure  . Shellfish Allergy Other (See Comments)    Gout    Current Outpatient Medications  Medication Sig Dispense Refill  . allopurinol (ZYLOPRIM) 100 MG tablet Take 1 tablet (100 mg total) by mouth at bedtime. 90 tablet 3  . amLODipine (NORVASC) 10 MG tablet TAKE 1 TABLET EVERY DAY 90 tablet 1  . aspirin EC 81 MG tablet Take 81 mg by mouth at bedtime.     . docusate sodium (COLACE) 100 MG capsule Take 1 capsule (100 mg total) by mouth 2 (two) times daily as needed for mild constipation. (Patient taking differently: Take 100 mg by mouth 2 (two) times daily.) 60 capsule 0  . HYDROcodone-acetaminophen (NORCO) 7.5-325 MG tablet Take 1 tablet by mouth 2 (two) times daily as needed for moderate pain. 60 tablet 0  . metoprolol succinate (TOPROL-XL) 100 MG 24 hr tablet Take 1 tablet (100 mg total) by mouth daily. Take with or immediately following a meal. 90 tablet 3  . Multiple Vitamin (MULTIVITAMIN WITH MINERALS) TABS tablet Take 1 tablet by mouth daily.    Marland Kitchen OVER THE COUNTER MEDICATION Take 1 tablet  by mouth 2 (two) times daily as needed (pain). Pain medication that dissolves like alka-seltzer - from [facility]    . rosuvastatin (CRESTOR) 20 MG tablet TAKE 1 TABLET EVERY DAY 90 tablet 1  . tamsulosin (FLOMAX) 0.4 MG CAPS capsule TAKE 2 CAPSULES (0.8 MG TOTAL) BY MOUTH EVERY EVENING. [employer]  capsule 3  . albuterol (PROVENTIL HFA;VENTOLIN HFA) 108 (90 Base) MCG/ACT inhaler Inhale 2 puffs into the lungs every 6 (six) hours as needed for wheezing or shortness of breath. (Patient not taking: No sig reported) 1 Inhaler 2  . polyethylene glycol (MIRALAX / GLYCOLAX) packet Take 17 g by mouth daily. Take daily until you achieve soft daily bowel movements, then titrate dosing to continue having daily soft bowel movements (Patient not taking: No sig reported) 30 each 0   No current facility-administered medications for this visit.    REVIEW OF SYSTEMS:  [X]  denotes positive finding, [ ]  denotes negative finding Cardiac  Comments:  Chest pain or chest pressure:    Shortness of breath upon exertion: x   Short of breath when lying flat:    Irregular heart rhythm:        Vascular    Pain in calf, thigh, or hip brought on by ambulation: x   Pain in feet at night that wakes you up from your sleep:     Blood clot in your veins:    Leg swelling:         Pulmonary    Oxygen at home:    Productive cough:     Wheezing:         Neurologic    Sudden weakness in arms or legs:     Sudden numbness in arms or legs:     Sudden onset of difficulty speaking or slurred speech:    Temporary loss of vision in one eye:     Problems with dizziness:         Gastrointestinal    Blood in stool:     Vomited blood:         Genitourinary    Burning when urinating:     Blood in urine:        Psychiatric    Major depression:         Hematologic    Bleeding problems:    Problems with blood clotting too easily:        Skin    Rashes or ulcers:        Constitutional    Fever or chills:     PHYSICAL EXAM:    Vitals:   08/30/20 0900  BP: 117/70  Pulse: (!) 59  Resp: 20  Temp: (!) 97.3 F (36.3 C)  SpO2: 96%  Weight: 66.9 kg  Height: 6' (1.829 m)    GENERAL: The patient is a well-nourished male, in no acute distress. The vital signs are documented above. CARDIAC: There is a regular rate and rhythm.  VASCULAR: I do not detect carotid bruits. He has palpable femoral pulses.  I cannot palpate pedal pulses. He has no significant lower extremity swelling. PULMONARY: There is good air exchange bilaterally without wheezing or rales. ABDOMEN: Soft and non-tender with normal pitched bowel sounds.  His aneurysm is palpable and nontender. MUSCULOSKELETAL: There are no major deformities or cyanosis. NEUROLOGIC: No focal weakness or paresthesias are detected. SKIN: There are no ulcers or rashes noted. PSYCHIATRIC: The patient has a normal affect.  DATA:    ARTERIAL DOPPLER STUDY: I have independently interpreted his arterial Doppler study today.  On the right side, there is a biphasic dorsalis pedis and posterior tibial signal.  ABIs 100%.  Toe pressure 68 mmHg.  On the left side, the biphasic dorsalis pedis and posterior tibial signal.  ABIs 100%.  Toe pressures 83 mmHg.  DUPLEX ABDOMINAL AORTA: I have  independently interpreted his duplex of his abdominal aorta.  The maximum diameter of his aneurysm is 5.2 cm.  This has not changed compared to the study 6 months ago.  The right common iliac artery measures 1.9 cm in maximum diameter.  The left common iliac artery measures 1.9 cm in maximum diameter.  CT ANGIO CHEST: I did review his CT angiogram of the chest that was done on 08/29/2020.  The maximum diameter of his thoracic aorta was 3.6 cm.  LABS: I reviewed his labs from 08/14/2020.  His creatinine was 0.87.  Deitra Mayo [facility] [phone]

## 2020-08-31 ENCOUNTER — Other Ambulatory Visit: Payer: Self-pay

## 2020-08-31 DIAGNOSIS — I714 Abdominal aortic aneurysm, without rupture, unspecified: Secondary | ICD-10-CM

## 2020-09-06 DIAGNOSIS — R3914 Feeling of incomplete bladder emptying: Secondary | ICD-10-CM | POA: Diagnosis not present

## 2020-09-06 DIAGNOSIS — R350 Frequency of micturition: Secondary | ICD-10-CM | POA: Diagnosis not present

## 2020-09-12 ENCOUNTER — Other Ambulatory Visit: Payer: Self-pay

## 2020-09-12 DIAGNOSIS — Z79891 Long term (current) use of opiate analgesic: Secondary | ICD-10-CM

## 2020-09-12 DIAGNOSIS — M25551 Pain in right hip: Secondary | ICD-10-CM

## 2020-09-12 NOTE — Telephone Encounter (Signed)
He given a 3 month supply at his visit last month. He should have two prescriptions on file with his pharmacy. Please have him call to get these filled.

## 2020-09-12 NOTE — Telephone Encounter (Signed)
Last rx written  08/14/20. Last OV 08/14/20. Next OV  11/06/20. UDS 11/08/19.

## 2020-09-12 NOTE — Telephone Encounter (Signed)
Called pt - informed pt he has refills and to call his pharmacy.

## 2020-09-12 NOTE — Telephone Encounter (Signed)
Pt is requesting his HYDROcodone-acetaminophen ([place]) 7.5-325 MG tablet sent to  [id number] [place], [place] [facility] [address] Phone:  [phone]  Fax:  [phone]

## 2020-10-03 ENCOUNTER — Encounter: Payer: Self-pay | Admitting: *Deleted

## 2020-10-03 NOTE — Progress Notes (Unsigned)

## 2020-10-05 NOTE — Progress Notes (Unsigned)
Things That May Be Affecting Your Health:  Alcohol  Hearing loss  Pain    Depression  Home Safety  Sexual Health   Diabetes x Lack of physical activity  Stress   Difficulty with daily activities  Loneliness  Tiredness   Drug use  Medicines x Tobacco use   Falls  Motor Vehicle Safety  Weight   Food choices  Oral Health  Other    YOUR PERSONALIZED HEALTH PLAN : 1. Schedule your next subsequent Medicare Wellness visit in one year 2. Attend all of your regular appointments to address your medical issues 3. Complete the preventative screenings and services   Annual Wellness Visit   Medicare Covered Preventative Screenings and [place] Men and Women Who How Often Need? Date of Last Service Action  Abdominal Aortic Aneurysm Adults with AAA risk factors Once      Alcohol Misuse and Counseling All Adults Screening once a year if no alcohol misuse. Counseling up to 4 face to face sessions.     Bone Density Measurement  Adults at risk for osteoporosis Once every 2 yrs      Lipid Panel Z13.6 All adults without CV disease Once every 5 yrs       Colorectal Cancer   Stool sample or  Colonoscopy All adults 52 and older   Once every year  Every 10 years        Depression All Adults Once a year  Today   Diabetes Screening Blood glucose, post glucose load, or GTT Z13.1  All adults at risk  Pre-diabetics  Once per year  Twice per year      Diabetes  Self-Management Training All adults Diabetics 10 hrs first year; 2 hours subsequent years. Requires Copay     Glaucoma  Diabetics  Family history of glaucoma  African Americans 70 yrs +  Hispanic Americans 74 yrs + Annually - requires coppay      Hepatitis C Z72.89 or F19.20  High Risk for HCV  Born between 1945 and 1965  Annually  Once      HIV Z11.4 All adults based on risk  Annually btw ages 32 & 91 regardless of risk  Annually > 65 yrs if at increased risk      Lung Cancer Screening  Asymptomatic adults aged 43-77 with 30 pack yr history and current smoker OR quit within the last 15 yrs Annually Must have counseling and shared decision making documentation before first screen      Medical Nutrition Therapy Adults with   Diabetes  Renal disease  Kidney transplant within past 3 yrs 3 hours first year; 2 hours subsequent years     Obesity and Counseling All adults Screening once a year Counseling if BMI 30 or higher  Today   Tobacco Use Counseling Adults who use tobacco  Up to 8 visits in one year     Vaccines Z23  Hepatitis B  Influenza   Pneumonia  Adults   Once  Once every flu season  Two different vaccines separated by one year     Next Annual Wellness Visit People with Medicare Every year  Today     Services & Screenings Women Who How Often Need  Date of Last Service Action  Mammogram  Z12.31 Women over 43 One baseline ages 62-39. Annually ager 40 yrs+      Pap tests All women Annually if high risk. Every 2 yrs for normal risk women  Screening for cervical cancer with   Pap (Z01.419 nl or Z01.411abnl) &  HPV Z11.51 Women aged 59 to 56 Once every 5 yrs     Screening pelvic and breast exams All women Annually if high risk. Every 2 yrs for normal risk women     Sexually Transmitted Diseases  Chlamydia  Gonorrhea  Syphilis All at risk adults Annually for non pregnant females at increased risk         [place] Men Who How Ofter Need  Date of Last Service Action  Prostate Cancer - DRE & PSA Men over 50 Annually.  DRE might require a copay.        Sexually Transmitted Diseases  Syphilis All at risk adults Annually for men at increased risk      Health Maintenance List Health Maintenance  Topic Date Due  . TETANUS/TDAP  03/14/2019  . COVID-19 Vaccine (3 - Booster for Pfizer series) 03/21/2020  . INFLUENZA VACCINE  01/15/2021  . COLONOSCOPY (Pts 45-6yrs Insurance coverage will need to be confirmed)  03/31/2021  .  Hepatitis C Screening  Completed  . PNA vac Low Risk Adult  Completed  . HPV VACCINES  Aged Out    Please check on covid booster and tetanus vaccine

## 2020-10-12 ENCOUNTER — Telehealth: Payer: Self-pay

## 2020-10-12 NOTE — Telephone Encounter (Signed)
Need refill on HYDROcodone-acetaminophen (NORCO) 7.5-325 MG tablet ;pt contact [phone]   [id number] [place] [place]

## 2020-10-12 NOTE — Telephone Encounter (Signed)
Patient should have a RX available on hold at pharmacy.  [facility] spoke with [facility] who confirms RX is on hold and eligible for refill tomorrow. Pt called and notified of above. SChaplin, RN,BSN

## 2020-10-31 ENCOUNTER — Other Ambulatory Visit: Payer: Self-pay

## 2020-10-31 MED ORDER — AMLODIPINE BESYLATE 10 MG PO TABS: 1.0000 | ORAL_TABLET | Freq: Every day | ORAL | 1 refills | Status: DC

## 2020-10-31 MED ORDER — METOPROLOL SUCCINATE ER 100 MG PO TB24: 100.0000 mg | ORAL_TABLET | Freq: Every day | ORAL | 3 refills | Status: DC

## 2020-10-31 NOTE — Telephone Encounter (Signed)
  amLODipine (NORVASC) 10 MG tablet  metoprolol succinate (TOPROL-XL) 100 MG 24 hr tablet REFILL REQUEST @  [place], [place] Phone:  [phone]  Fax:  [phone]

## 2020-11-06 ENCOUNTER — Inpatient Hospital Stay (HOSPITAL_COMMUNITY)
Admission: EM | Admit: 2020-11-06 | Discharge: 2020-11-13 | DRG: 871 | Disposition: A | Discharge status: Home Health | Payer: Medicare HMO | Attending: Internal Medicine | Admitting: Internal Medicine

## 2020-11-06 ENCOUNTER — Other Ambulatory Visit: Payer: Self-pay

## 2020-11-06 ENCOUNTER — Observation Stay (HOSPITAL_COMMUNITY): Payer: Medicare HMO

## 2020-11-06 ENCOUNTER — Emergency Department (HOSPITAL_BASED_OUTPATIENT_CLINIC_OR_DEPARTMENT_OTHER): Payer: Medicare HMO

## 2020-11-06 ENCOUNTER — Encounter (HOSPITAL_COMMUNITY): Payer: Self-pay | Admitting: Emergency Medicine

## 2020-11-06 ENCOUNTER — Emergency Department (HOSPITAL_COMMUNITY): Payer: Medicare HMO

## 2020-11-06 ENCOUNTER — Telehealth: Payer: Self-pay | Admitting: *Deleted

## 2020-11-06 ENCOUNTER — Encounter: Payer: Medicare HMO | Admitting: Student in an Organized Health Care Education/Training Program

## 2020-11-06 DIAGNOSIS — K5900 Constipation, unspecified: Secondary | ICD-10-CM | POA: Diagnosis present

## 2020-11-06 DIAGNOSIS — I714 Abdominal aortic aneurysm, without rupture: Secondary | ICD-10-CM | POA: Diagnosis present

## 2020-11-06 DIAGNOSIS — D125 Benign neoplasm of sigmoid colon: Secondary | ICD-10-CM | POA: Diagnosis not present

## 2020-11-06 DIAGNOSIS — I77810 Thoracic aortic ectasia: Secondary | ICD-10-CM | POA: Diagnosis present

## 2020-11-06 DIAGNOSIS — I441 Atrioventricular block, second degree: Secondary | ICD-10-CM | POA: Diagnosis not present

## 2020-11-06 DIAGNOSIS — K8689 Other specified diseases of pancreas: Secondary | ICD-10-CM | POA: Diagnosis not present

## 2020-11-06 DIAGNOSIS — D122 Benign neoplasm of ascending colon: Secondary | ICD-10-CM | POA: Diagnosis not present

## 2020-11-06 DIAGNOSIS — M109 Gout, unspecified: Secondary | ICD-10-CM | POA: Diagnosis present

## 2020-11-06 DIAGNOSIS — N182 Chronic kidney disease, stage 2 (mild): Secondary | ICD-10-CM | POA: Diagnosis present

## 2020-11-06 DIAGNOSIS — E43 Unspecified severe protein-calorie malnutrition: Secondary | ICD-10-CM | POA: Diagnosis present

## 2020-11-06 DIAGNOSIS — R778 Other specified abnormalities of plasma proteins: Secondary | ICD-10-CM

## 2020-11-06 DIAGNOSIS — I452 Bifascicular block: Secondary | ICD-10-CM | POA: Diagnosis present

## 2020-11-06 DIAGNOSIS — A4159 Other Gram-negative sepsis: Secondary | ICD-10-CM | POA: Diagnosis present

## 2020-11-06 DIAGNOSIS — B9689 Other specified bacterial agents as the cause of diseases classified elsewhere: Secondary | ICD-10-CM | POA: Diagnosis present

## 2020-11-06 DIAGNOSIS — I259 Chronic ischemic heart disease, unspecified: Secondary | ICD-10-CM | POA: Diagnosis present

## 2020-11-06 DIAGNOSIS — K259 Gastric ulcer, unspecified as acute or chronic, without hemorrhage or perforation: Secondary | ICD-10-CM | POA: Diagnosis present

## 2020-11-06 DIAGNOSIS — Z79899 Other long term (current) drug therapy: Secondary | ICD-10-CM

## 2020-11-06 DIAGNOSIS — R159 Full incontinence of feces: Secondary | ICD-10-CM | POA: Diagnosis present

## 2020-11-06 DIAGNOSIS — I81 Portal vein thrombosis: Secondary | ICD-10-CM | POA: Diagnosis present

## 2020-11-06 DIAGNOSIS — Z902 Acquired absence of lung [part of]: Secondary | ICD-10-CM

## 2020-11-06 DIAGNOSIS — I252 Old myocardial infarction: Secondary | ICD-10-CM

## 2020-11-06 DIAGNOSIS — D126 Benign neoplasm of colon, unspecified: Secondary | ICD-10-CM | POA: Diagnosis present

## 2020-11-06 DIAGNOSIS — D123 Benign neoplasm of transverse colon: Secondary | ICD-10-CM | POA: Diagnosis present

## 2020-11-06 DIAGNOSIS — E8809 Other disorders of plasma-protein metabolism, not elsewhere classified: Secondary | ICD-10-CM | POA: Diagnosis present

## 2020-11-06 DIAGNOSIS — Z91013 Allergy to seafood: Secondary | ICD-10-CM

## 2020-11-06 DIAGNOSIS — I444 Left anterior fascicular block: Secondary | ICD-10-CM | POA: Diagnosis present

## 2020-11-06 DIAGNOSIS — Z8719 Personal history of other diseases of the digestive system: Secondary | ICD-10-CM

## 2020-11-06 DIAGNOSIS — R7881 Bacteremia: Secondary | ICD-10-CM | POA: Diagnosis not present

## 2020-11-06 DIAGNOSIS — Z85118 Personal history of other malignant neoplasm of bronchus and lung: Secondary | ICD-10-CM

## 2020-11-06 DIAGNOSIS — G839 Paralytic syndrome, unspecified: Secondary | ICD-10-CM | POA: Diagnosis present

## 2020-11-06 DIAGNOSIS — I70213 Atherosclerosis of native arteries of extremities with intermittent claudication, bilateral legs: Secondary | ICD-10-CM | POA: Diagnosis not present

## 2020-11-06 DIAGNOSIS — R0602 Shortness of breath: Secondary | ICD-10-CM | POA: Diagnosis not present

## 2020-11-06 DIAGNOSIS — I723 Aneurysm of iliac artery: Secondary | ICD-10-CM | POA: Diagnosis present

## 2020-11-06 DIAGNOSIS — Z7982 Long term (current) use of aspirin: Secondary | ICD-10-CM

## 2020-11-06 DIAGNOSIS — D61818 Other pancytopenia: Secondary | ICD-10-CM | POA: Diagnosis not present

## 2020-11-06 DIAGNOSIS — D638 Anemia in other chronic diseases classified elsewhere: Secondary | ICD-10-CM | POA: Diagnosis present

## 2020-11-06 DIAGNOSIS — I130 Hypertensive heart and chronic kidney disease with heart failure and stage 1 through stage 4 chronic kidney disease, or unspecified chronic kidney disease: Secondary | ICD-10-CM | POA: Diagnosis present

## 2020-11-06 DIAGNOSIS — R8281 Pyuria: Secondary | ICD-10-CM | POA: Diagnosis not present

## 2020-11-06 DIAGNOSIS — Z86010 Personal history of colonic polyps: Secondary | ICD-10-CM | POA: Diagnosis not present

## 2020-11-06 DIAGNOSIS — K8020 Calculus of gallbladder without cholecystitis without obstruction: Secondary | ICD-10-CM | POA: Diagnosis present

## 2020-11-06 DIAGNOSIS — R011 Cardiac murmur, unspecified: Secondary | ICD-10-CM

## 2020-11-06 DIAGNOSIS — R7303 Prediabetes: Secondary | ICD-10-CM | POA: Diagnosis present

## 2020-11-06 DIAGNOSIS — K760 Fatty (change of) liver, not elsewhere classified: Secondary | ICD-10-CM | POA: Diagnosis present

## 2020-11-06 DIAGNOSIS — K860 Alcohol-induced chronic pancreatitis: Secondary | ICD-10-CM | POA: Diagnosis present

## 2020-11-06 DIAGNOSIS — Q438 Other specified congenital malformations of intestine: Secondary | ICD-10-CM | POA: Diagnosis not present

## 2020-11-06 DIAGNOSIS — D539 Nutritional anemia, unspecified: Secondary | ICD-10-CM | POA: Diagnosis present

## 2020-11-06 DIAGNOSIS — Z8249 Family history of ischemic heart disease and other diseases of the circulatory system: Secondary | ICD-10-CM

## 2020-11-06 DIAGNOSIS — G8929 Other chronic pain: Secondary | ICD-10-CM | POA: Diagnosis present

## 2020-11-06 DIAGNOSIS — I5032 Chronic diastolic (congestive) heart failure: Secondary | ICD-10-CM | POA: Diagnosis present

## 2020-11-06 DIAGNOSIS — K3189 Other diseases of stomach and duodenum: Secondary | ICD-10-CM | POA: Diagnosis not present

## 2020-11-06 DIAGNOSIS — F1020 Alcohol dependence, uncomplicated: Secondary | ICD-10-CM | POA: Diagnosis present

## 2020-11-06 DIAGNOSIS — I21A1 Myocardial infarction type 2: Secondary | ICD-10-CM | POA: Diagnosis present

## 2020-11-06 DIAGNOSIS — N179 Acute kidney failure, unspecified: Secondary | ICD-10-CM | POA: Diagnosis present

## 2020-11-06 DIAGNOSIS — K219 Gastro-esophageal reflux disease without esophagitis: Secondary | ICD-10-CM | POA: Diagnosis present

## 2020-11-06 DIAGNOSIS — Z681 Body mass index (BMI) 19 or less, adult: Secondary | ICD-10-CM

## 2020-11-06 DIAGNOSIS — D649 Anemia, unspecified: Secondary | ICD-10-CM | POA: Diagnosis not present

## 2020-11-06 DIAGNOSIS — R7401 Elevation of levels of liver transaminase levels: Secondary | ICD-10-CM | POA: Diagnosis not present

## 2020-11-06 DIAGNOSIS — Z79891 Long term (current) use of opiate analgesic: Secondary | ICD-10-CM

## 2020-11-06 DIAGNOSIS — C259 Malignant neoplasm of pancreas, unspecified: Secondary | ICD-10-CM

## 2020-11-06 DIAGNOSIS — R634 Abnormal weight loss: Secondary | ICD-10-CM | POA: Diagnosis not present

## 2020-11-06 DIAGNOSIS — K635 Polyp of colon: Secondary | ICD-10-CM | POA: Diagnosis not present

## 2020-11-06 DIAGNOSIS — R64 Cachexia: Secondary | ICD-10-CM | POA: Diagnosis present

## 2020-11-06 DIAGNOSIS — F17210 Nicotine dependence, cigarettes, uncomplicated: Secondary | ICD-10-CM | POA: Diagnosis present

## 2020-11-06 DIAGNOSIS — E441 Mild protein-calorie malnutrition: Secondary | ICD-10-CM | POA: Insufficient documentation

## 2020-11-06 DIAGNOSIS — E785 Hyperlipidemia, unspecified: Secondary | ICD-10-CM | POA: Diagnosis present

## 2020-11-06 DIAGNOSIS — Z20822 Contact with and (suspected) exposure to covid-19: Secondary | ICD-10-CM | POA: Diagnosis present

## 2020-11-06 DIAGNOSIS — K2270 Barrett's esophagus without dysplasia: Secondary | ICD-10-CM | POA: Diagnosis present

## 2020-11-06 DIAGNOSIS — I2510 Atherosclerotic heart disease of native coronary artery without angina pectoris: Secondary | ICD-10-CM | POA: Diagnosis present

## 2020-11-06 DIAGNOSIS — R918 Other nonspecific abnormal finding of lung field: Secondary | ICD-10-CM | POA: Diagnosis present

## 2020-11-06 DIAGNOSIS — J439 Emphysema, unspecified: Secondary | ICD-10-CM | POA: Diagnosis present

## 2020-11-06 DIAGNOSIS — K2950 Unspecified chronic gastritis without bleeding: Secondary | ICD-10-CM | POA: Diagnosis not present

## 2020-11-06 DIAGNOSIS — N401 Enlarged prostate with lower urinary tract symptoms: Secondary | ICD-10-CM | POA: Diagnosis present

## 2020-11-06 DIAGNOSIS — Z888 Allergy status to other drugs, medicaments and biological substances status: Secondary | ICD-10-CM

## 2020-11-06 DIAGNOSIS — N281 Cyst of kidney, acquired: Secondary | ICD-10-CM | POA: Diagnosis present

## 2020-11-06 DIAGNOSIS — R001 Bradycardia, unspecified: Secondary | ICD-10-CM | POA: Diagnosis present

## 2020-11-06 DIAGNOSIS — R338 Other retention of urine: Secondary | ICD-10-CM | POA: Diagnosis present

## 2020-11-06 DIAGNOSIS — R531 Weakness: Secondary | ICD-10-CM | POA: Diagnosis present

## 2020-11-06 DIAGNOSIS — N200 Calculus of kidney: Secondary | ICD-10-CM | POA: Diagnosis not present

## 2020-11-06 DIAGNOSIS — K2960 Other gastritis without bleeding: Secondary | ICD-10-CM | POA: Diagnosis not present

## 2020-11-06 DIAGNOSIS — K8681 Exocrine pancreatic insufficiency: Secondary | ICD-10-CM | POA: Diagnosis present

## 2020-11-06 DIAGNOSIS — E876 Hypokalemia: Secondary | ICD-10-CM | POA: Diagnosis present

## 2020-11-06 DIAGNOSIS — K31A0 Gastric intestinal metaplasia, unspecified: Secondary | ICD-10-CM | POA: Diagnosis not present

## 2020-11-06 DIAGNOSIS — Z955 Presence of coronary angioplasty implant and graft: Secondary | ICD-10-CM

## 2020-11-06 DIAGNOSIS — K5730 Diverticulosis of large intestine without perforation or abscess without bleeding: Secondary | ICD-10-CM | POA: Diagnosis not present

## 2020-11-06 DIAGNOSIS — N3091 Cystitis, unspecified with hematuria: Secondary | ICD-10-CM | POA: Diagnosis present

## 2020-11-06 DIAGNOSIS — R5381 Other malaise: Secondary | ICD-10-CM | POA: Diagnosis not present

## 2020-11-06 DIAGNOSIS — K828 Other specified diseases of gallbladder: Secondary | ICD-10-CM | POA: Diagnosis not present

## 2020-11-06 HISTORY — DX: Atherosclerotic heart disease of native coronary artery without angina pectoris: I25.10

## 2020-11-06 LAB — COMPREHENSIVE METABOLIC PANEL
ALT: 46 U/L — ABNORMAL HIGH (ref 0–44)
AST: 66 U/L — ABNORMAL HIGH (ref 15–41)
Albumin: 2.2 g/dL — ABNORMAL LOW (ref 3.5–5.0)
Alkaline Phosphatase: 67 U/L (ref 38–126)
Anion gap: 15 (ref 5–15)
BUN: 29 mg/dL — ABNORMAL HIGH (ref 8–23)
CO2: 22 mmol/L (ref 22–32)
Calcium: 9.6 mg/dL (ref 8.9–10.3)
Chloride: 102 mmol/L (ref 98–111)
Creatinine, Ser: 1.82 mg/dL — ABNORMAL HIGH (ref 0.61–1.24)
GFR, Estimated: 38 mL/min — ABNORMAL LOW (ref >60)
Glucose, Bld: 86 mg/dL (ref 70–99)
Potassium: 3.2 mmol/L — ABNORMAL LOW (ref 3.5–5.1)
Sodium: 139 mmol/L (ref 135–145)
Total Bilirubin: 2.3 mg/dL — ABNORMAL HIGH (ref 0.3–1.2)
Total Protein: 6.8 g/dL (ref 6.5–8.1)

## 2020-11-06 LAB — RETIC PANEL
Immature Retic Fract: 8.5 % (ref 2.3–15.9)
RBC.: 3.07 MIL/uL — ABNORMAL LOW (ref 4.22–5.81)
Retic Count, Absolute: 35.6 10*3/uL (ref 19.0–186.0)
Retic Ct Pct: 1.2 % (ref 0.4–3.1)
Reticulocyte Hemoglobin: 35.8 pg (ref >27.9)

## 2020-11-06 LAB — CBC WITH DIFFERENTIAL/PLATELET
Abs Immature Granulocytes: 0.02 10*3/uL (ref 0.00–0.07)
Basophils Absolute: 0 10*3/uL (ref 0.0–0.1)
Basophils Relative: 0 %
Eosinophils Absolute: 0.1 10*3/uL (ref 0.0–0.5)
Eosinophils Relative: 1 %
HCT: 36.6 % — ABNORMAL LOW (ref 39.0–52.0)
Hemoglobin: 11.7 g/dL — ABNORMAL LOW (ref 13.0–17.0)
Immature Granulocytes: 0 %
Lymphocytes Relative: 12 %
Lymphs Abs: 0.8 10*3/uL (ref 0.7–4.0)
MCH: 33.3 pg (ref 26.0–34.0)
MCHC: 32 g/dL (ref 30.0–36.0)
MCV: 104.3 fL — ABNORMAL HIGH (ref 80.0–100.0)
Monocytes Absolute: 0.4 10*3/uL (ref 0.1–1.0)
Monocytes Relative: 6 %
Neutro Abs: 5 10*3/uL (ref 1.7–7.7)
Neutrophils Relative %: 81 %
Platelets: 157 10*3/uL (ref 150–400)
RBC: 3.51 MIL/uL — ABNORMAL LOW (ref 4.22–5.81)
RDW: 13.2 % (ref 11.5–15.5)
WBC: 6.2 10*3/uL (ref 4.0–10.5)
nRBC: 0 % (ref 0.0–0.2)

## 2020-11-06 LAB — ETHANOL: Alcohol, Ethyl (B): <10 mg/dL (ref <10)

## 2020-11-06 LAB — FERRITIN: Ferritin: 489 ng/mL — ABNORMAL HIGH (ref 24–336)

## 2020-11-06 LAB — ECHOCARDIOGRAM COMPLETE
Area-P 1/2: 2.08 cm2
S' Lateral: 2.9 cm

## 2020-11-06 LAB — IRON AND TIBC
Iron: 38 ug/dL — ABNORMAL LOW (ref 45–182)
Saturation Ratios: 27 % (ref 17.9–39.5)
TIBC: 140 ug/dL — ABNORMAL LOW (ref 250–450)
UIBC: 102 ug/dL

## 2020-11-06 LAB — LACTATE DEHYDROGENASE: LDH: 98 U/L (ref 98–192)

## 2020-11-06 LAB — RESP PANEL BY RT-PCR (FLU A&B, COVID) ARPGX2
Influenza A by PCR: NEGATIVE
Influenza B by PCR: NEGATIVE
SARS Coronavirus 2 by RT PCR: NEGATIVE

## 2020-11-06 LAB — BILIRUBIN, FRACTIONATED(TOT/DIR/INDIR)
Bilirubin, Direct: 0.6 mg/dL — ABNORMAL HIGH (ref 0.0–0.2)
Indirect Bilirubin: 1.2 mg/dL — ABNORMAL HIGH (ref 0.3–0.9)
Total Bilirubin: 1.8 mg/dL — ABNORMAL HIGH (ref 0.3–1.2)

## 2020-11-06 LAB — FOLATE: Folate: 32.9 ng/mL (ref >5.9)

## 2020-11-06 LAB — TROPONIN I (HIGH SENSITIVITY)
Troponin I (High Sensitivity): 66 ng/L — ABNORMAL HIGH (ref <18)
Troponin I (High Sensitivity): 69 ng/L — ABNORMAL HIGH (ref <18)
Troponin I (High Sensitivity): 69 ng/L — ABNORMAL HIGH (ref <18)

## 2020-11-06 LAB — SAVE SMEAR(SSMR), FOR PROVIDER SLIDE REVIEW

## 2020-11-06 LAB — VITAMIN B12: Vitamin B-12: 958 pg/mL — ABNORMAL HIGH (ref 180–914)

## 2020-11-06 LAB — LIPASE, BLOOD: Lipase: 65 U/L — ABNORMAL HIGH (ref 11–51)

## 2020-11-06 LAB — LACTIC ACID, PLASMA: Lactic Acid, Venous: 1 mmol/L (ref 0.5–1.9)

## 2020-11-06 MED ORDER — ONDANSETRON HCL 4 MG/2ML IJ SOLN: 4.0000 mg | Freq: Three times a day (TID) | INTRAMUSCULAR | Status: DC | PRN

## 2020-11-06 MED ORDER — HYDROCODONE-ACETAMINOPHEN 5-325 MG PO TABS
1.0000 | ORAL_TABLET | Freq: Two times a day (BID) | ORAL | Status: DC | PRN
  Administered 2020-11-07 – 2020-11-08 (×3): 1 via ORAL
  Filled 2020-11-06 (×4): qty 1

## 2020-11-06 MED ORDER — LACTATED RINGERS IV BOLUS
1000.0000 mL | Freq: Once | INTRAVENOUS | Status: AC
  Administered 2020-11-06: 1000 mL via INTRAVENOUS

## 2020-11-06 MED ORDER — POTASSIUM CHLORIDE 20 MEQ PO PACK
40.0000 meq | PACK | Freq: Two times a day (BID) | ORAL | Status: DC
  Administered 2020-11-07: 40 meq via ORAL
  Filled 2020-11-06 (×2): qty 2

## 2020-11-06 MED ORDER — ROSUVASTATIN CALCIUM 20 MG PO TABS
20.0000 mg | ORAL_TABLET | Freq: Every day | ORAL | Status: DC
  Administered 2020-11-06 – 2020-11-13 (×8): 20 mg via ORAL
  Filled 2020-11-06 (×7): qty 1
  Filled 2020-11-06: qty 4

## 2020-11-06 MED ORDER — ASPIRIN 81 MG PO CHEW
324.0000 mg | CHEWABLE_TABLET | Freq: Once | ORAL | Status: AC
  Administered 2020-11-06: 324 mg via ORAL
  Filled 2020-11-06: qty 4

## 2020-11-06 MED ORDER — LACTATED RINGERS IV SOLN: INTRAVENOUS | Status: AC

## 2020-11-06 MED ORDER — PERFLUTREN LIPID MICROSPHERE
1.0000 mL | INTRAVENOUS | Status: AC | PRN
  Administered 2020-11-06: 2 mL via INTRAVENOUS
  Filled 2020-11-06: qty 10

## 2020-11-06 MED ORDER — METOPROLOL TARTRATE 25 MG PO TABS
25.0000 mg | ORAL_TABLET | Freq: Two times a day (BID) | ORAL | Status: DC
  Administered 2020-11-06 – 2020-11-07 (×2): 25 mg via ORAL
  Filled 2020-11-06 (×2): qty 1

## 2020-11-06 MED ORDER — TAMSULOSIN HCL 0.4 MG PO CAPS
0.8000 mg | ORAL_CAPSULE | Freq: Every evening | ORAL | Status: DC
  Administered 2020-11-06 – 2020-11-12 (×7): 0.8 mg via ORAL
  Filled 2020-11-06 (×7): qty 2

## 2020-11-06 MED ORDER — SODIUM CHLORIDE 0.9% FLUSH
3.0000 mL | Freq: Two times a day (BID) | INTRAVENOUS | Status: DC
  Administered 2020-11-06 – 2020-11-13 (×10): 3 mL via INTRAVENOUS

## 2020-11-06 NOTE — ED Triage Notes (Signed)
Pt here from home with c/o gen weakness and not eating along with urinary frequency , pt missed his appointment today with health and weakness, cbg by ems 101 , no fevers

## 2020-11-06 NOTE — Progress Notes (Signed)
New Admission Note:   Arrival Method: Arrived from [facility] ED via stretcher Mental Orientation: Alert and oriented x4 but forgetful Telemetry: Box #19 Assessment: Completed Skin: Intact IV: Rt AC Pain: 3/10 Tubes: N/A Safety Measures: Safety Fall Prevention Plan has been discussed.  Admission: Completed 5MW Orientation: Patient has been oriented to the room, unit and staff.  Family: None at bedside  Orders have been reviewed and implemented. Will continue to monitor the patient. Call light has been placed within reach and bed alarm has been activated.   Mikeria Valin [employer], RN-BC Phone number: [phone]

## 2020-11-06 NOTE — Progress Notes (Signed)
  Echocardiogram 2D Echocardiogram has been performed.  Elmer Ramp 11/06/2020, 4:47 PM

## 2020-11-06 NOTE — H&P (Signed)
Date: 11/06/2020               Patient Name:  [name] MRN: [id number]  DOB: [birth year] Age / Sex: 75 y.o., male   PCP: Axel Filler, MD         Medical Service: Internal Medicine Teaching Service         Attending Physician: Dr. Aldine Contes, MD    First Contact: Dr. Konrad Penta Pager: [phone]  Second Contact: Dr. Marva Panda Pager: [phone]       After Hours (After 5p/  First Contact Pager: [phone]  weekends / holidays): Second Contact Pager: [phone]   Chief Complaint: Generalized Weakness  History of Present Illness:   [name] is a 75 y.o. gentleman w/ PMHx AAA, CKD stage II, CAD, PAC's, TUD, HTN, colonic polyposis, pre-DM, hx AUD, chronic opiate use, and gout, presenting due to 6 days of generalized weakness. The patient states he has been experiencing significant fatigue and shortness of breath with minimal exertion (such as getting out of bed) over this time period and attributes this to decreased PO intake. He has also experienced light-headedness only upon standing that improves with rest and constipation during this time. He says his decreased appetite occurred suddenly and had previously been eating well, although notes he has had a "good amount of weight loss" over the past 6 months. Last BM was about 5 days ago. He denies any nausea, vomiting, abdominal pain, diarrhea, melena, BRBPR, fevers, chills, night sweats, SOB or cough at rest, CP, palpitations, or any other symptoms. His brother notes his decreased appetite and weakness may have been present for longer, ~2 weeks, although agrees with the patient that he had previously been very active, out shopping, able to cook, pay his own bills, and care for himself independently at home. Denies SI. He notes he was supposed to have an [facility] appointment but was unable to get transportation to his appointment.   Home Medications: Albuterol 2 puffs q6 hrs Allopurinol 100mg  nightly  Amlodipine 10mg  daily  ASA  81mg  nightly  Colace 100mg  BID PRN  Hydrocodone-acetaminophen 1 tablet BID PRN  Toprol-XL 100mg  daily  MTV daily  Miralax 1 packet daily PRN  Rosuvastatin 20mg  daily  Tamsulosin 0.8mg  nightly   Allergies: Allergies as of 11/06/2020 - Review Complete 11/06/2020  Allergen Reaction Noted  . Candesartan cilexetil-hctz Other (See Comments)   . Hydrochlorothiazide Other (See Comments)   . Shellfish allergy Other (See Comments) 03/24/2013   Past Medical History:  Diagnosis Date  . AAA (abdominal aortic aneurysm) (HCC)    noted CT 11/2012 size 3.3 x 3.2 cm  . Alcohol abuse   . Barretts esophagus    bx distal esophagus 2011 neg   . Bladder outlet obstruction 04/02/2018  . Cataract   . Cataract   . CHF (congestive heart failure) ([place])   . Cholelithiasis   . Chronic osteomyelitis involving ankle and foot, right ([place]) 07/13/2018  . 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   2006 - 3 adenomas largest 12 mm  2008 - no polyps 2013-  2 mm cecal polyp, adenoma, routine repeat colonoscopy 02/2017 approx 05/14/2017 18 polyps max 12 mm ALL adenomas recall 2019 -  [name], MD, [facility]     . Diverticulosis of colon   . DJD (degenerative joint disease)    left hip  . Full dentures    upper and lower  . GERD (gastroesophageal reflux disease)    diet controlled   .  Gout    feet  . Hiatal hernia    2 cm noted on upper endos 2011   . Hx of adenomatous colonic polyps   . Hyperlipidemia   . Hypertension   . Internal hemorrhoid   . 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 Myocardial infarction [facility]) 2004   hx of, 2 stents 2004. pt was on Plavix x 3 years then transitioned to ASA, no problems since  . Non-small cell carcinoma of right lung, stage 1 ([place]) 01/30/2016  . Renal failure, acute (HCC)    hx of 2/2 medication, resolved  . Tobacco abuse    smoker .5 ppd x 55 yrs  . UTI (lower urinary tract infection)     Family History:  Family History   Problem Relation Age of Onset  . Diabetes Mother   . Hypertension Sister   . Heart disease Sister   . Diabetes Sister   . Arthritis Brother   . Gout Brother   . Colon cancer Neg Hx   . Esophageal cancer Neg Hx   . Rectal cancer Neg Hx   . Stomach cancer Neg Hx   . Colon polyps Neg Hx    Social History:  Patient lives at home alone and previously had been independent in ADL's. Currently smokes cigarettes, ~1PPD since childhood (>50 pack year smoking history)  Says he last had alcohol 2 years ago and endorses a history of AUD Denies any illicit drug or IVDU.   Review of Systems: A complete ROS was negative except as per HPI.   Physical Exam: Blood pressure 122/62, pulse 72, temperature 97.9 F (36.6 C), resp. rate 15, SpO2 98 %.  General: Patient is thin and appears chronically ill, although resting comfortably in no acute distress. Eyes: Mild scleral icterus. PERRLA.  HENT: Slightly dry mucus membranes. Poor dentition.  Respiratory: Lungs are CTA, bilaterally. No wheezes, rales, or rhonchi. No increased work of breathing on room air.  Cardiovascular: Rate is intermittently bradycardic. Rhythm is irregularly irregular. There is a 3/6 SEM over the right upper chest. No other m/r/g appreciated. No lower extremity edema. Neurological: Alert and oriented x 4. CN III-XII intact.  MSK: Patient has severe, diffuse cachexia. Normal muscle tone.  Abdominal: Soft, non-distended and non-tender to palpation. Bowel sounds intact. No rebound or guarding. Skin: No lesions. No rashes.  Psych: Normal affect. Normal tone of voice.   EKG: personally reviewed my interpretation is second degree AV block type I at an average rate of 66 bpm. Flattened but upright T waves throughout.   CXR: personally reviewed my interpretation is s/p right upper lobectomy with decreased right lung volumes. There is flattening of the left diaphragm. No overt edema or consolidations. There does appear to be colonic  distention in LUQ.  Assessment & Plan by Problem: Active Problems:   AKI (acute kidney injury) ([place])  # Decreased Appetite / Generalized Weakness  # Chronic Weight Loss  Patient says he has been eating significantly less over the past week and attributes his generalized weakness and orthostatic symptoms to this. He does note significant weight loss over the past 6 months although says he previously had been eating well. Albumin is 2.2. Differential includes infection, carcinoma, endocrine disorder, among others. He does have a history of colonic polyposis as well as a 57mm LUL nodule 08/2018 with a significant smoking history, raising concern for malignant process. He does not have any signs or symptoms of acute  infection on exam and given that he is afebrile without WBC, acute infectious etiology is less likely. HIV was negative 07/2020. He does have emphysematous changes noted on previous chest imaging as well as pulmonary vessel prominence suggestive of PAH, so it is possible he has more advanced COPD although he denies SOB or cough at rest to suggest exacerbation.  - Check TSH  - Check HIV and hepatitis panel - Check urinalysis and culture  - Check blood cultures  - Check Hgb A1c  - Started on diet  - Zofran 4mg  PRN for nausea  - Strict I&O   # New 2nd Degree AV Block, Type I  # LAFB # New AS Murmur  Mr. White endorses orthostatic symptoms and DOE although denies CP, palpitations, or any other symptoms. He does have a history of PAC's with last EKG showing "wandering atrial pacemaker" with RBBB and LAFB. There are no prior [facility] for comparison.  - Check ECHO  - Reduce Metoprolol to 25mg  BID for now given soft pressures  - I&O and daily weights   # Hx of CAD # NSTEMI Type II  Patient endorses exertional fatigue and dyspnea although denies angina. Troponin 66 > 69. Lactic acid is normal. He was given chewable ASA 325mg  x 1 although EKG did not show signs other than new heart block to  suggest ischemia and he denies any symptoms currently.   - Continue ASA 81mg  daily  - Continue Crestor 20mg  daily   # AKI on CKD Stage II  # Hypokalemia  Creatinine on arrival was 1.82 (baseline ~0.9), BUN 29, GFR 38, K 3.2, likely pre-renal in the setting of decreased PO intake recently. He appears slightly dry on exam. Received 1L LR bolus in the ED.  - Continue LR 9mL/hr  - KCl 49mEq packet BID for now  - Check magnesium  - Continue to monitor daily renal function  - Continue to replace electrolytes to goal K 4-5, Mg >2   # Elevated Liver Enzymes  # Mixed Hyperbilirubinemia  # Hx AUD ETOH <10. AST and ALT are mildly elevated at 66 and 46 respectively. T. Bili is 1.8, with direct bilirubin of 0.6 consistent with mixed picture. ALP is normal making obstruction less likely and platelet count is low normal, although may drop with fluid hydration with hx of thrombocytopenia (?possible cirrhosis). Lipase is also mildly elevated at 65 although he denies any abdominal pain or nausea without abdominal tenderness to suggest acute pancreatitis.  - Check PT/APTT, hepatitis panel   - Check RUQ ultrasound  - Continue to trend CMP   # Acute on Chronic, Hypoproliferative Macrocytic Anemia  # Hyperbilirubinemia  Hgb 11.7, MCV 104.3, RDW normal with low reticulocyte index of 1.13. His Hgb was 13.2 on 01/2020. He denies any obvious bleeding although has had recent constipation. Less than 5 years ago, patient had >10 adenomas. Last colonoscopy was 03/2019 which showed four sessile polyps in the rectum, descending colon, and ascending colon that were diminutive. It is possible he may have colon cancer as a cause of his decreased appetite, weight loss, and acute on chronic anemia, although he does have lab work consistent with hyperproliferation so he may have underlying pancytopenia / cirrhosis.  - Check iron studies, vitamin B12, folate  - Check haptoglobin  - Pathologist smear / save smear pending   # Hx  of HTN Blood pressure was initially low at 79/54, although quickly normalized to the 962'I systolic s/p 1L bolus. Hypotension may have been in  the setting of arrhythmia as above and dehydration with likely component of medication over-dosing with recent weight loss. Patient takes Toprol-XL 100mg  and amlodipine 10mg  daily at home.  - Hold home amlodipine  - Start Lopressor 25mg  BID for now  - Check orthostatic vital signs - Monitor BP closely     # TUD  Patient continues to smoke cigarettes and has >50 pack year history.   # Chronic Pain  Patient takes Norco PRN at home for pain.  - Norco 5-325mg  twice daily PRN for severe pain   # BPH - Hold home tamsulosin for now due to low pressures   # Gout - Will hold allopurinol in acute setting due to AKI  Code Status: Full Code  Diet: HH  Fluids: LR 75cc's/hr  DVT PPx: SCD's   Dispo: Admit patient to Observation with expected length of stay less than 2 midnights.  Signed: Jeralyn Bennett, MD 11/06/2020, 7:08 PM  Pager: [phone] After 5pm on weekdays and 1pm on weekends: On Call pager: [phone]

## 2020-11-06 NOTE — ED Provider Notes (Signed)
Emergency Medicine Provider Triage Evaluation Note  [name] , a 75 y.o. male  was evaluated in triage.  Pt complains of generalized weakness for 4 days.  Reports decreased appetite urinary frequency.  Missed his PCP appointment today and was sent to the ER.  Denies chest pain, abdominal pain, headache, numbness.  Review of Systems  Positive: Generalized weakness Negative: Headache, chest pain, abdominal pain  Physical Exam  BP (!) 79/54 (BP Location: Left Arm)   Pulse 66   Temp 97.9 F (36.6 C)   Resp 15   SpO2 98%  Gen:   Awake, no distress   Resp:  Normal effort  MSK:   Moves extremities without difficulty  Other:  Abdomen is soft  Medical Decision Making  Medically screening exam initiated at 12:30 PM.  Appropriate orders placed.  AUGUSTIN BUN was informed that the remainder of the evaluation will be completed by another provider, this initial triage assessment does not replace that evaluation, and the importance of remaining in the ED until their evaluation is complete.  Will obtain labs and imaging. Hypotensive, informed triage that patient needs room soon.   Delia Heady, PA-C 11/06/20 1242    Horton, Alvin Critchley, DO 11/06/20 1418

## 2020-11-06 NOTE — ED Provider Notes (Signed)
[place] EMERGENCY DEPARTMENT Provider Note   CSN: [id number] Arrival date & time: 11/06/20  1215     History No chief complaint on file.   [name] is a 75 y.o. male.  Patient has generalized fatigue.  Denies chest pain, denies shortness of breath.  Is has no energy to get around his house.  His not eating or drinking.  He is losing weight.  Denies fevers denies trauma.  Has had normal bowel movement normal urination for him.        Past Medical History:  Diagnosis Date  . AAA (abdominal aortic aneurysm) (HCC)    noted CT 11/2012 size 3.3 x 3.2 cm  . Alcohol abuse   . Barretts esophagus    bx distal esophagus 2011 neg   . Bladder outlet obstruction 04/02/2018  . Cataract   . Cataract   . CHF (congestive heart failure) ([place])   . Cholelithiasis   . Chronic osteomyelitis involving ankle and foot, right ([place]) 07/13/2018  . 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   2006 - 3 adenomas largest 12 mm  2008 - no polyps 2013-  2 mm cecal polyp, adenoma, routine repeat colonoscopy 02/2017 approx 05/14/2017 18 polyps max 12 mm ALL adenomas recall 2019 -  [name], MD, [facility]     . Diverticulosis of colon   . DJD (degenerative joint disease)    left hip  . Full dentures    upper and lower  . GERD (gastroesophageal reflux disease)    diet controlled   . Gout    feet  . Hiatal hernia    2 cm noted on upper endos 2011   . Hx of adenomatous colonic polyps   . Hyperlipidemia   . Hypertension   . Internal hemorrhoid   . 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 Myocardial infarction [facility]) 2004   hx of, 2 stents 2004. pt was on Plavix x 3 years then transitioned to ASA, no problems since  . Non-small cell carcinoma of right lung, stage 1 ([place]) 01/30/2016  . Renal failure, acute (HCC)    hx of 2/2 medication, resolved  . Tobacco abuse    smoker .5 ppd x 55 yrs  . UTI (lower urinary tract infection)      Patient Active Problem List   Diagnosis Date Noted  . Unintentional weight loss 08/14/2020  . Urinary retention 04/02/2018  . Mild alcohol use disorder 12/09/2016  . Premature atrial contractions 07/31/2015  . Chronic use of opiate for therapeutic purpose 05/01/2015  . Atherosclerosis of aorta ([place]) 01/13/2015  . Prediabetes 12/30/2014  . AAA (abdominal aortic aneurysm) ([place])   . Healthcare maintenance 06/30/2012  . CAD (coronary artery disease) 03/17/2012  . Erectile dysfunction 10/12/2010  . Arthropathy of pelvic region and thigh 05/02/2008  . CKD (chronic kidney disease) stage 2, GFR 60-89 ml/min 08/07/2007  . 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  . Gout 04/18/2006  . Tobacco use disorder 04/18/2006  . Essential hypertension 04/18/2006  . GERD 04/18/2006    Past Surgical History:  Procedure Laterality Date  . cataract surgery Bilateral 2018   removal of cataracts  . COLONOSCOPY     hx polyps - [name] 02/2012  . CORONARY ANGIOPLASTY WITH STENT PLACEMENT    . flexible cystourethroscopy  05/24/06  . INGUINAL HERNIA REPAIR  2005  . LUNG LOBECTOMY Right 11/07   RUL  . MULTIPLE TOOTH EXTRACTIONS    .  UPPER GASTROINTESTINAL ENDOSCOPY         Family History  Problem Relation Age of Onset  . Diabetes Mother   . Hypertension Sister   . Heart disease Sister   . Diabetes Sister   . Arthritis Brother   . Gout Brother   . Colon cancer Neg Hx   . Esophageal cancer Neg Hx   . Rectal cancer Neg Hx   . Stomach cancer Neg Hx   . Colon polyps Neg Hx     Social History   Tobacco Use  . Smoking status: Current Every Day Smoker    Packs/day: 0.25    Years: 55.00    Pack years: 13.75    Types: Cigarettes  . Smokeless tobacco: Never Used  Vaping Use  . Vaping Use: Never used  Substance Use Topics  . Alcohol use: Yes    Alcohol/week: 0.0 standard drinks    Comment: drinking 1-2 pts gin/week, (mixed with tap water not tonic)  . Drug use: No    Home  Medications Prior to Admission medications   Medication Sig Start Date End Date Taking? Authorizing Provider  albuterol (PROVENTIL HFA;VENTOLIN HFA) 108 (90 Base) MCG/ACT inhaler Inhale 2 puffs into the lungs every 6 (six) hours as needed for wheezing or shortness of breath. Patient not taking: No sig reported 06/02/17   Axel Filler, MD  allopurinol (ZYLOPRIM) 100 MG tablet Take 1 tablet (100 mg total) by mouth at bedtime. 11/08/19   Axel Filler, MD  amLODipine (NORVASC) 10 MG tablet Take 1 tablet (10 mg total) by mouth daily. 10/31/20   Axel Filler, MD  aspirin EC 81 MG tablet Take 81 mg by mouth at bedtime.     [provider]  docusate sodium (COLACE) 100 MG capsule Take 1 capsule (100 mg total) by mouth 2 (two) times daily as needed for mild constipation. Patient taking differently: Take 100 mg by mouth 2 (two) times daily. 07/25/18   Street, Kimberly, PA-C  HYDROcodone-acetaminophen (NORCO) 7.5-325 MG tablet Take 1 tablet by mouth 2 (two) times daily as needed for moderate pain. 08/14/20   Axel Filler, MD  metoprolol succinate (TOPROL-XL) 100 MG 24 hr tablet Take 1 tablet (100 mg total) by mouth daily. Take with or immediately following a meal. 10/31/20   Axel Filler, MD  Multiple Vitamin (MULTIVITAMIN WITH MINERALS) TABS tablet Take 1 tablet by mouth daily.    [provider]  OVER THE COUNTER MEDICATION Take 1 tablet by mouth 2 (two) times daily as needed (pain). Pain medication that dissolves like alka-seltzer - from [facility]    [provider]  polyethylene glycol (MIRALAX / GLYCOLAX) packet Take 17 g by mouth daily. Take daily until you achieve soft daily bowel movements, then titrate dosing to continue having daily soft bowel movements Patient not taking: No sig reported 07/25/18   Street, [place], PA-C  rosuvastatin (CRESTOR) 20 MG tablet TAKE 1 TABLET EVERY DAY 08/28/20   Axel Filler, MD  tamsulosin  (FLOMAX) 0.4 MG CAPS capsule TAKE 2 CAPSULES (0.8 MG TOTAL) BY MOUTH EVERY EVENING. 08/28/20   Axel Filler, MD    Allergies    Candesartan cilexetil-hctz, Hydrochlorothiazide, and Shellfish allergy  Review of Systems   Review of Systems  Constitutional: Positive for fatigue. Negative for chills and fever.  HENT: Negative for congestion and rhinorrhea.   Respiratory: Negative for cough and shortness of breath.   Cardiovascular: Negative for chest pain and palpitations.  Gastrointestinal:  Negative for diarrhea, nausea and vomiting.  Genitourinary: Negative for difficulty urinating and dysuria.  Musculoskeletal: Negative for arthralgias and back pain.  Skin: Negative for color change and rash.  Neurological: Positive for weakness (generalized). Negative for light-headedness and headaches.    Physical Exam Updated Vital Signs BP 120/70   Pulse 66   Temp 97.9 F (36.6 C)   Resp 17   SpO2 100%   Physical Exam Vitals and nursing note reviewed.  Constitutional:      General: He is not in acute distress.    Appearance: He is ill-appearing. He is not toxic-appearing.  HENT:     Head: Normocephalic and atraumatic.     Nose: No rhinorrhea.  Eyes:     General:        Right eye: No discharge.        Left eye: No discharge.     Conjunctiva/sclera: Conjunctivae normal.  Cardiovascular:     Rate and Rhythm: Normal rate. Rhythm irregular.  Pulmonary:     Effort: Pulmonary effort is normal.     Breath sounds: No stridor.  Abdominal:     General: Abdomen is flat. There is no distension.     Palpations: Abdomen is soft.  Musculoskeletal:        General: No deformity or signs of injury.  Skin:    General: Skin is warm and dry.  Neurological:     General: No focal deficit present.     Mental Status: He is alert. Mental status is at baseline.     Motor: No weakness.  Psychiatric:        Mood and Affect: Mood normal.        Behavior: Behavior normal.        Thought  Content: Thought content normal.     ED Results / Procedures / Treatments   Labs (all labs ordered are listed, but only abnormal results are displayed) Labs Reviewed  COMPREHENSIVE METABOLIC PANEL - Abnormal; Notable for the following components:      Result Value   Potassium 3.2 (*)    BUN 29 (*)    Creatinine, Ser 1.82 (*)    Albumin 2.2 (*)    AST 66 (*)    ALT 46 (*)    Total Bilirubin 2.3 (*)    GFR, Estimated 38 (*)    All other components within normal limits  LIPASE, BLOOD - Abnormal; Notable for the following components:   Lipase 65 (*)    All other components within normal limits  CBC WITH DIFFERENTIAL/PLATELET - Abnormal; Notable for the following components:   RBC 3.51 (*)    Hemoglobin 11.7 (*)    HCT 36.6 (*)    MCV 104.3 (*)    All other components within normal limits  TROPONIN I (HIGH SENSITIVITY) - Abnormal; Notable for the following components:   Troponin I (High Sensitivity) 66 (*)    All other components within normal limits  URINE CULTURE  CULTURE, BLOOD (ROUTINE X 2)  CULTURE, BLOOD (ROUTINE X 2)  ETHANOL  LACTIC ACID, PLASMA  URINALYSIS, ROUTINE W REFLEX MICROSCOPIC  TROPONIN I (HIGH SENSITIVITY)    EKG EKG Interpretation  Date/Time:  Monday Nov 06 2020 12:32:08 EDT Ventricular Rate:  66 PR Interval:    QRS Duration: 124 QT Interval:  436 QTC Calculation: 457 R Axis:   -52 Text Interpretation: Atrial fibrillation Left anterior fascicular block Possible Anteroseptal infarct , age undetermined Abnormal ECG Confirmed by Dewaine Conger [phone]) on 11/06/2020  2:31:09 PM   Radiology DG Chest Portable 1 View  Result Date: 11/06/2020 CLINICAL DATA:  Shortness of breath with exertion over the last 4 days. EXAM: PORTABLE CHEST 1 VIEW COMPARISON:  04/08/2008.  CT 08/29/2020. FINDINGS: Previous right upper lobectomy. Heart size is normal. Chronic aortic atherosclerotic calcification. Residual lung appears clear. No evidence of edema, infiltrate, collapse  or effusion. IMPRESSION: Previous right upper lobectomy.  No active disease. Aortic atherosclerosis. Electronically Signed   By: Nelson Chimes M.D.   On: 11/06/2020 13:48    Procedures Procedures   Medications Ordered in ED Medications  lactated ringers bolus 1,000 mL (0 mLs Intravenous Stopped 11/06/20 1443)  aspirin chewable tablet 324 mg (324 mg Oral Given 11/06/20 1443)    ED Course  I have reviewed the triage vital signs and the nursing notes.  Pertinent labs & imaging results that were available during my care of the patient were reviewed by me and considered in my medical decision making (see chart for details).    MDM Rules/Calculators/A&P                          Concerns for fatigue and poor nutrition and dehydration.  No focal findings on exam.  Patient does look cachectic,.  Giving him IV fluids for presumed hypovolemia.  Laboratory studies show an elevated troponin.  EKG is unremarkable for any acute ischemic changes but he does have significant irregularity.  Laboratory studies also show consistently with AKI no other significant findings there.  Elevated troponin.  That with even heart rate irregularity, will give aspirin.  Medicine team consulted for admission and agrees. Final Clinical Impression(s) / ED Diagnoses Final diagnoses:  AKI (acute kidney injury) ([place])  Elevated troponin    Rx / DC Orders ED Discharge Orders    None       Breck Coons, MD 11/06/20 [phone]

## 2020-11-06 NOTE — Telephone Encounter (Signed)
F/U - per Epic, pt is currently in the ED.

## 2020-11-06 NOTE — Telephone Encounter (Signed)
I was asked by [facility] to call and f/u on pt . He had an appt to see Dr Evette Doffing this am but did not come. I called pt - stated he's weak and unable to walk like he use to. I asked how long has this been going on, he stated about an week. I asked if he lives alone; stated he does. I asked if someone checks on him; stated his brother but he's out of town. Denies having any pain. I asked if he has been eating and drinking; he stated "Iwould like some water", and he has been cooking "a little". Stated "I need to see a doctor"; I asked why he did not come this am;he stated [facility] did not come to pick him up, stated was aware of his appt. Informed pt he needs to come to the ED to be evaluated, which he does not want to do. I asked pt will it ok to have EMS to check on him and if he needs to come to the ED they will bring him; he's agreeable. He asked if I would call- I called 911 to have EMS to check pt.  I called [facility] who stated they arrived at pt's house @ 0900 AM and pt told the driver he was not ready and to come back @ 1000 AM which the drive told him he was unable to do (she stated his appt was at 1000 am and they will arrive 30 - 60 mins prior d/t car sharing)

## 2020-11-07 ENCOUNTER — Inpatient Hospital Stay (HOSPITAL_COMMUNITY): Payer: Medicare HMO

## 2020-11-07 DIAGNOSIS — I81 Portal vein thrombosis: Secondary | ICD-10-CM | POA: Diagnosis present

## 2020-11-07 DIAGNOSIS — E441 Mild protein-calorie malnutrition: Secondary | ICD-10-CM | POA: Insufficient documentation

## 2020-11-07 DIAGNOSIS — E8809 Other disorders of plasma-protein metabolism, not elsewhere classified: Secondary | ICD-10-CM | POA: Diagnosis present

## 2020-11-07 DIAGNOSIS — D649 Anemia, unspecified: Secondary | ICD-10-CM | POA: Diagnosis not present

## 2020-11-07 DIAGNOSIS — J439 Emphysema, unspecified: Secondary | ICD-10-CM | POA: Diagnosis present

## 2020-11-07 DIAGNOSIS — K2960 Other gastritis without bleeding: Secondary | ICD-10-CM | POA: Diagnosis not present

## 2020-11-07 DIAGNOSIS — A4159 Other Gram-negative sepsis: Secondary | ICD-10-CM | POA: Diagnosis not present

## 2020-11-07 DIAGNOSIS — K860 Alcohol-induced chronic pancreatitis: Secondary | ICD-10-CM | POA: Diagnosis present

## 2020-11-07 DIAGNOSIS — Q438 Other specified congenital malformations of intestine: Secondary | ICD-10-CM | POA: Diagnosis not present

## 2020-11-07 DIAGNOSIS — Z86010 Personal history of colonic polyps: Secondary | ICD-10-CM | POA: Diagnosis not present

## 2020-11-07 DIAGNOSIS — R64 Cachexia: Secondary | ICD-10-CM | POA: Diagnosis not present

## 2020-11-07 DIAGNOSIS — I441 Atrioventricular block, second degree: Secondary | ICD-10-CM | POA: Diagnosis not present

## 2020-11-07 DIAGNOSIS — D125 Benign neoplasm of sigmoid colon: Secondary | ICD-10-CM | POA: Diagnosis not present

## 2020-11-07 DIAGNOSIS — R7881 Bacteremia: Secondary | ICD-10-CM | POA: Diagnosis present

## 2020-11-07 DIAGNOSIS — D122 Benign neoplasm of ascending colon: Secondary | ICD-10-CM | POA: Diagnosis not present

## 2020-11-07 DIAGNOSIS — K2950 Unspecified chronic gastritis without bleeding: Secondary | ICD-10-CM | POA: Diagnosis not present

## 2020-11-07 DIAGNOSIS — Z20822 Contact with and (suspected) exposure to covid-19: Secondary | ICD-10-CM | POA: Diagnosis not present

## 2020-11-07 DIAGNOSIS — D123 Benign neoplasm of transverse colon: Secondary | ICD-10-CM | POA: Diagnosis not present

## 2020-11-07 DIAGNOSIS — E43 Unspecified severe protein-calorie malnutrition: Secondary | ICD-10-CM | POA: Diagnosis not present

## 2020-11-07 DIAGNOSIS — F1020 Alcohol dependence, uncomplicated: Secondary | ICD-10-CM | POA: Diagnosis present

## 2020-11-07 DIAGNOSIS — R634 Abnormal weight loss: Secondary | ICD-10-CM | POA: Diagnosis not present

## 2020-11-07 DIAGNOSIS — I714 Abdominal aortic aneurysm, without rupture: Secondary | ICD-10-CM | POA: Diagnosis present

## 2020-11-07 DIAGNOSIS — K760 Fatty (change of) liver, not elsewhere classified: Secondary | ICD-10-CM | POA: Diagnosis present

## 2020-11-07 DIAGNOSIS — K635 Polyp of colon: Secondary | ICD-10-CM | POA: Diagnosis not present

## 2020-11-07 DIAGNOSIS — I723 Aneurysm of iliac artery: Secondary | ICD-10-CM | POA: Diagnosis not present

## 2020-11-07 DIAGNOSIS — D638 Anemia in other chronic diseases classified elsewhere: Secondary | ICD-10-CM | POA: Diagnosis not present

## 2020-11-07 DIAGNOSIS — I130 Hypertensive heart and chronic kidney disease with heart failure and stage 1 through stage 4 chronic kidney disease, or unspecified chronic kidney disease: Secondary | ICD-10-CM | POA: Diagnosis not present

## 2020-11-07 DIAGNOSIS — K259 Gastric ulcer, unspecified as acute or chronic, without hemorrhage or perforation: Secondary | ICD-10-CM | POA: Diagnosis present

## 2020-11-07 DIAGNOSIS — K8020 Calculus of gallbladder without cholecystitis without obstruction: Secondary | ICD-10-CM | POA: Diagnosis not present

## 2020-11-07 DIAGNOSIS — I77810 Thoracic aortic ectasia: Secondary | ICD-10-CM | POA: Diagnosis present

## 2020-11-07 DIAGNOSIS — K3189 Other diseases of stomach and duodenum: Secondary | ICD-10-CM | POA: Diagnosis not present

## 2020-11-07 DIAGNOSIS — D539 Nutritional anemia, unspecified: Secondary | ICD-10-CM | POA: Diagnosis present

## 2020-11-07 DIAGNOSIS — K31A0 Gastric intestinal metaplasia, unspecified: Secondary | ICD-10-CM | POA: Diagnosis not present

## 2020-11-07 DIAGNOSIS — R531 Weakness: Secondary | ICD-10-CM | POA: Diagnosis present

## 2020-11-07 DIAGNOSIS — N200 Calculus of kidney: Secondary | ICD-10-CM | POA: Diagnosis not present

## 2020-11-07 DIAGNOSIS — G839 Paralytic syndrome, unspecified: Secondary | ICD-10-CM | POA: Diagnosis present

## 2020-11-07 DIAGNOSIS — K5730 Diverticulosis of large intestine without perforation or abscess without bleeding: Secondary | ICD-10-CM | POA: Diagnosis not present

## 2020-11-07 DIAGNOSIS — N179 Acute kidney failure, unspecified: Secondary | ICD-10-CM | POA: Diagnosis present

## 2020-11-07 DIAGNOSIS — D61818 Other pancytopenia: Secondary | ICD-10-CM | POA: Diagnosis not present

## 2020-11-07 DIAGNOSIS — R8281 Pyuria: Secondary | ICD-10-CM | POA: Diagnosis not present

## 2020-11-07 DIAGNOSIS — I5032 Chronic diastolic (congestive) heart failure: Secondary | ICD-10-CM | POA: Diagnosis not present

## 2020-11-07 DIAGNOSIS — K8689 Other specified diseases of pancreas: Secondary | ICD-10-CM | POA: Diagnosis not present

## 2020-11-07 DIAGNOSIS — I70213 Atherosclerosis of native arteries of extremities with intermittent claudication, bilateral legs: Secondary | ICD-10-CM | POA: Diagnosis not present

## 2020-11-07 DIAGNOSIS — I21A1 Myocardial infarction type 2: Secondary | ICD-10-CM | POA: Diagnosis not present

## 2020-11-07 LAB — BLOOD CULTURE ID PANEL (REFLEXED) - BCID2
A.calcoaceticus-baumannii: NOT DETECTED
Bacteroides fragilis: NOT DETECTED
CTX-M ESBL: NOT DETECTED
Candida albicans: NOT DETECTED
Candida auris: NOT DETECTED
Candida glabrata: NOT DETECTED
Candida krusei: NOT DETECTED
Candida parapsilosis: NOT DETECTED
Candida tropicalis: NOT DETECTED
Carbapenem resist OXA 48 LIKE: NOT DETECTED
Carbapenem resistance IMP: NOT DETECTED
Carbapenem resistance KPC: NOT DETECTED
Carbapenem resistance NDM: NOT DETECTED
Carbapenem resistance VIM: NOT DETECTED
Cryptococcus neoformans/gattii: NOT DETECTED
Enterobacter cloacae complex: NOT DETECTED
Enterobacterales: DETECTED — AB
Enterococcus Faecium: NOT DETECTED
Enterococcus faecalis: NOT DETECTED
Escherichia coli: NOT DETECTED
Haemophilus influenzae: NOT DETECTED
Klebsiella aerogenes: NOT DETECTED
Klebsiella oxytoca: NOT DETECTED
Klebsiella pneumoniae: NOT DETECTED
Listeria monocytogenes: NOT DETECTED
Methicillin resistance mecA/C: DETECTED — AB
Neisseria meningitidis: NOT DETECTED
Proteus species: NOT DETECTED
Pseudomonas aeruginosa: NOT DETECTED
Salmonella species: NOT DETECTED
Serratia marcescens: NOT DETECTED
Staphylococcus aureus (BCID): NOT DETECTED
Staphylococcus epidermidis: DETECTED — AB
Staphylococcus lugdunensis: NOT DETECTED
Staphylococcus species: DETECTED — AB
Stenotrophomonas maltophilia: NOT DETECTED
Streptococcus agalactiae: NOT DETECTED
Streptococcus pneumoniae: NOT DETECTED
Streptococcus pyogenes: NOT DETECTED
Streptococcus species: NOT DETECTED

## 2020-11-07 LAB — HEPATITIS PANEL, ACUTE
HCV Ab: NONREACTIVE
Hep A IgM: NONREACTIVE
Hep B C IgM: NONREACTIVE
Hepatitis B Surface Ag: NONREACTIVE

## 2020-11-07 LAB — COMPREHENSIVE METABOLIC PANEL
ALT: 46 U/L — ABNORMAL HIGH (ref 0–44)
AST: 57 U/L — ABNORMAL HIGH (ref 15–41)
Albumin: 2.2 g/dL — ABNORMAL LOW (ref 3.5–5.0)
Alkaline Phosphatase: 73 U/L (ref 38–126)
Anion gap: 6 (ref 5–15)
BUN: 23 mg/dL (ref 8–23)
CO2: 30 mmol/L (ref 22–32)
Calcium: 9.7 mg/dL (ref 8.9–10.3)
Chloride: 102 mmol/L (ref 98–111)
Creatinine, Ser: 1.51 mg/dL — ABNORMAL HIGH (ref 0.61–1.24)
GFR, Estimated: 48 mL/min — ABNORMAL LOW (ref >60)
Glucose, Bld: 127 mg/dL — ABNORMAL HIGH (ref 70–99)
Potassium: 3.3 mmol/L — ABNORMAL LOW (ref 3.5–5.1)
Sodium: 138 mmol/L (ref 135–145)
Total Bilirubin: 1.3 mg/dL — ABNORMAL HIGH (ref 0.3–1.2)
Total Protein: 6.2 g/dL — ABNORMAL LOW (ref 6.5–8.1)

## 2020-11-07 LAB — TSH: TSH: 0.986 u[IU]/mL (ref 0.350–4.500)

## 2020-11-07 LAB — URINALYSIS, ROUTINE W REFLEX MICROSCOPIC
Bilirubin Urine: NEGATIVE
Glucose, UA: 50 mg/dL — AB
Ketones, ur: 5 mg/dL — AB
Leukocytes,Ua: NEGATIVE
Nitrite: NEGATIVE
Protein, ur: 100 mg/dL — AB
Specific Gravity, Urine: 1.016 (ref 1.005–1.030)
pH: 6 (ref 5.0–8.0)

## 2020-11-07 LAB — HEMOGLOBIN A1C
Hgb A1c MFr Bld: 5.4 % (ref 4.8–5.6)
Mean Plasma Glucose: 108 mg/dL

## 2020-11-07 LAB — APTT: aPTT: 33 seconds (ref 24–36)

## 2020-11-07 LAB — PATHOLOGIST SMEAR REVIEW

## 2020-11-07 LAB — CBC
HCT: 34.2 % — ABNORMAL LOW (ref 39.0–52.0)
Hemoglobin: 11.4 g/dL — ABNORMAL LOW (ref 13.0–17.0)
MCH: 33.7 pg (ref 26.0–34.0)
MCHC: 33.3 g/dL (ref 30.0–36.0)
MCV: 101.2 fL — ABNORMAL HIGH (ref 80.0–100.0)
Platelets: 135 10*3/uL — ABNORMAL LOW (ref 150–400)
RBC: 3.38 MIL/uL — ABNORMAL LOW (ref 4.22–5.81)
RDW: 13.2 % (ref 11.5–15.5)
WBC: 6.3 10*3/uL (ref 4.0–10.5)
nRBC: 0 % (ref 0.0–0.2)

## 2020-11-07 LAB — GLUCOSE, CAPILLARY: Glucose-Capillary: 93 mg/dL (ref 70–99)

## 2020-11-07 LAB — PROTIME-INR
INR: 1.1 (ref 0.8–1.2)
Prothrombin Time: 13.8 seconds (ref 11.4–15.2)

## 2020-11-07 LAB — MAGNESIUM: Magnesium: 2 mg/dL (ref 1.7–2.4)

## 2020-11-07 LAB — HIV ANTIBODY (ROUTINE TESTING W REFLEX): HIV Screen 4th Generation wRfx: NONREACTIVE

## 2020-11-07 LAB — HAPTOGLOBIN: Haptoglobin: 250 mg/dL (ref 34–355)

## 2020-11-07 MED ORDER — LIDOCAINE HCL URETHRAL/MUCOSAL 2 % EX GEL
1.0000 "application " | Freq: Once | CUTANEOUS | Status: AC
  Administered 2020-11-07: 1 via URETHRAL
  Filled 2020-11-07: qty 11

## 2020-11-07 MED ORDER — IOHEXOL 300 MG/ML  SOLN
100.0000 mL | Freq: Once | INTRAMUSCULAR | Status: AC | PRN
  Administered 2020-11-07: 100 mL via INTRAVENOUS

## 2020-11-07 MED ORDER — CHLORHEXIDINE GLUCONATE CLOTH 2 % EX PADS
6.0000 | MEDICATED_PAD | Freq: Every day | CUTANEOUS | Status: DC
  Administered 2020-11-08 – 2020-11-13 (×5): 6 via TOPICAL

## 2020-11-07 MED ORDER — POTASSIUM CHLORIDE CRYS ER 20 MEQ PO TBCR
40.0000 meq | EXTENDED_RELEASE_TABLET | Freq: Three times a day (TID) | ORAL | Status: AC
  Administered 2020-11-07 (×2): 40 meq via ORAL
  Filled 2020-11-07 (×2): qty 2

## 2020-11-07 MED ORDER — SODIUM CHLORIDE 0.9 % IV SOLN
INTRAVENOUS | Status: DC | PRN
  Administered 2020-11-07: 250 mL via INTRAVENOUS

## 2020-11-07 MED ORDER — IOHEXOL 9 MG/ML PO SOLN
500.0000 mL | ORAL | Status: AC
Start: 2020-11-07 — End: 2020-11-07
  Administered 2020-11-07: 500 mL via ORAL

## 2020-11-07 MED ORDER — SODIUM CHLORIDE 0.9 % IV SOLN
2.0000 g | Freq: Two times a day (BID) | INTRAVENOUS | Status: AC
  Administered 2020-11-07 – 2020-11-10 (×7): 2 g via INTRAVENOUS
  Filled 2020-11-07 (×7): qty 2

## 2020-11-07 MED ORDER — ENSURE ENLIVE PO LIQD
237.0000 mL | Freq: Three times a day (TID) | ORAL | Status: DC
  Administered 2020-11-07 – 2020-11-13 (×13): 237 mL via ORAL
  Filled 2020-11-07: qty 237

## 2020-11-07 MED ORDER — LACTATED RINGERS IV BOLUS
1000.0000 mL | Freq: Once | INTRAVENOUS | Status: AC
  Administered 2020-11-07: 1000 mL via INTRAVENOUS

## 2020-11-07 MED ORDER — ADULT MULTIVITAMIN W/MINERALS CH
1.0000 | ORAL_TABLET | Freq: Every day | ORAL | Status: DC
  Administered 2020-11-07 – 2020-11-13 (×7): 1 via ORAL
  Filled 2020-11-07 (×7): qty 1

## 2020-11-07 MED ORDER — SODIUM CHLORIDE 0.9 % IV SOLN
2.0000 g | Freq: Once | INTRAVENOUS | Status: AC
  Administered 2020-11-07: 2 g via INTRAVENOUS
  Filled 2020-11-07: qty 2

## 2020-11-07 MED ORDER — METOPROLOL TARTRATE 12.5 MG HALF TABLET
12.5000 mg | ORAL_TABLET | Freq: Two times a day (BID) | ORAL | Status: DC
  Administered 2020-11-07 – 2020-11-11 (×8): 12.5 mg via ORAL
  Filled 2020-11-07 (×8): qty 1

## 2020-11-07 NOTE — Progress Notes (Addendum)
PHARMACY - PHYSICIAN COMMUNICATION CRITICAL VALUE ALERT - BLOOD CULTURE IDENTIFICATION (BCID)  [name] is an 75 y.o. male who presented to [facility] on 11/06/2020 with a chief complaint of generalized weakness.   Assessment:  Pt presented with weakness. BCID was called to 3rd shift pharmacist this AM. D/w MD that 1/4 bottles was positive for GNR and GPC. Spoke with Dr. Konrad Penta this AM that the GNR is not likely to be a contaminant and recommended abx to be added. We decided on cefepime since the organism is not on the BCID panel. It's not positive in 3/4 bottles. GPC is prob a contaminant since it's 1/4.   Name of physician (or Provider) Contacted: Dr. Konrad Penta  Current antibiotics: None  Changes to prescribed antibiotics recommended:  Start cefefime 2g IV q12  Results for orders placed or performed during the hospital encounter of 11/06/20  Blood Culture ID Panel (Reflexed) (Collected: 11/06/2020  1:28 PM)  Result Value Ref Range   Enterococcus faecalis NOT DETECTED NOT DETECTED   Enterococcus Faecium NOT DETECTED NOT DETECTED   Listeria monocytogenes NOT DETECTED NOT DETECTED   Staphylococcus species DETECTED (A) NOT DETECTED   Staphylococcus aureus (BCID) NOT DETECTED NOT DETECTED   Staphylococcus epidermidis DETECTED (A) NOT DETECTED   Staphylococcus lugdunensis NOT DETECTED NOT DETECTED   Streptococcus species NOT DETECTED NOT DETECTED   Streptococcus agalactiae NOT DETECTED NOT DETECTED   Streptococcus pneumoniae NOT DETECTED NOT DETECTED   Streptococcus pyogenes NOT DETECTED NOT DETECTED   A.calcoaceticus-baumannii NOT DETECTED NOT DETECTED   Bacteroides fragilis NOT DETECTED NOT DETECTED   Enterobacterales DETECTED (A) NOT DETECTED   Enterobacter cloacae complex NOT DETECTED NOT DETECTED   Escherichia coli NOT DETECTED NOT DETECTED   Klebsiella aerogenes NOT DETECTED NOT DETECTED   Klebsiella oxytoca NOT DETECTED NOT DETECTED   Klebsiella pneumoniae NOT DETECTED  NOT DETECTED   Proteus species NOT DETECTED NOT DETECTED   Salmonella species NOT DETECTED NOT DETECTED   Serratia marcescens NOT DETECTED NOT DETECTED   Haemophilus influenzae NOT DETECTED NOT DETECTED   Neisseria meningitidis NOT DETECTED NOT DETECTED   Pseudomonas aeruginosa NOT DETECTED NOT DETECTED   Stenotrophomonas maltophilia NOT DETECTED NOT DETECTED   Candida albicans NOT DETECTED NOT DETECTED   Candida auris NOT DETECTED NOT DETECTED   Candida glabrata NOT DETECTED NOT DETECTED   Candida krusei NOT DETECTED NOT DETECTED   Candida parapsilosis NOT DETECTED NOT DETECTED   Candida tropicalis NOT DETECTED NOT DETECTED   Cryptococcus neoformans/gattii NOT DETECTED NOT DETECTED   CTX-M ESBL NOT DETECTED NOT DETECTED   Carbapenem resistance IMP NOT DETECTED NOT DETECTED   Carbapenem resistance KPC NOT DETECTED NOT DETECTED   Methicillin resistance mecA/C DETECTED (A) NOT DETECTED   Carbapenem resistance NDM NOT DETECTED NOT DETECTED   Carbapenem resist OXA 48 LIKE NOT DETECTED NOT DETECTED   Carbapenem resistance VIM NOT DETECTED NOT DETECTED    Onnie Boer, PharmD, BCIDP, AAHIVP, CPP Infectious Disease Pharmacist 11/07/2020 1:33 PM

## 2020-11-07 NOTE — Progress Notes (Signed)
Initial Nutrition Assessment  DOCUMENTATION CODES:  Severe malnutrition in context of chronic illness  INTERVENTION:  Add Ensure Enlive po TID, each supplement provides 350 kcal and 20 grams of protein.  Add Magic cup TID with meals, each supplement provides 290 kcal and 9 grams of protein.  Add MVI with minerals daily.  NUTRITION DIAGNOSIS:  Severe Malnutrition related to chronic illness as evidenced by energy intake < or equal to 50% for > or equal to 5 days,percent weight loss,severe fat depletion,severe muscle depletion.  GOAL:  Patient will meet greater than or equal to 90% of their needs  MONITOR:  PO intake,Supplement acceptance,Labs,Weight trends,I & O's  REASON FOR ASSESSMENT:  Malnutrition Screening Tool    ASSESSMENT:  75 yo male with a PMH of AAA, CKD stage II, CAD, PAC's, TUD, HTN, colonic polyposis, prediabetes, AUD, chronic opiate use, and gout who presents with AKI, decreased appetite, generalized weakness, and chronic weight loss. MD considering possible malignancy.  Per H&P, pt reports he has been eating significantly less for the past week. Pt endorsed this during visit. He reports eating well previously to this last week PTA. Pt very preoccupied during RD visit as he really needed to use the bedside toilet. He was unable to elaborate on what he had been able to eat over the past week. Per Epic, pt ate 0% of breakfast this morning.  Pt reports that he has lost weight over the past 6 months in H&P. Pt endorsed this during visit. Per Epic, pt has lost 19 lbs (12%) in 7 months, which is significant and severe for this time frame.  On exam, pt had severe depletions in all fat and muscle stores. Given this and above, pt severely malnourished.  Recommend adding Ensure Enlive TID, Magic Cup TID, and MVI with minerals daily to promote caloric and protein intake. Encouraged good PO intake.  Medications: reviewed; Klor-Con 40 mEq, cefepime, LR @ 75 ml/hr, Vicodin PRN  (given twice today)  Labs: reviewed; K 3.3, CBG 87-102  NUTRITION - FOCUSED PHYSICAL EXAM: Flowsheet Row Most Recent Value  Orbital Region Severe depletion  Upper Arm Region Severe depletion  Thoracic and Lumbar Region Severe depletion  Buccal Region Severe depletion  Temple Region Severe depletion  Clavicle Bone Region Severe depletion  Clavicle and Acromion Bone Region Severe depletion  Scapular Bone Region Severe depletion  Dorsal Hand Severe depletion  Patellar Region Severe depletion  Anterior Thigh Region Severe depletion  Posterior Calf Region Severe depletion  Edema (RD Assessment) None  Hair Reviewed  Eyes Reviewed  Mouth Reviewed  Skin Reviewed  Nails Reviewed     Diet Order:   Diet Order            Diet Heart Room service appropriate? Yes; Fluid consistency: Thin  Diet effective now                EDUCATION NEEDS:  Education needs have been addressed  Skin:  Skin Assessment: Reviewed RN Assessment  Last BM:  PTA/unknown  Height:  Ht Readings from Last 1 Encounters:  11/06/20 6\' 1"  (1.854 m)   Weight:  Wt Readings from Last 1 Encounters:  11/07/20 62.8 kg   Ideal Body Weight:  83.6 kg  BMI:  Body mass index is 18.27 kg/m.  Estimated Nutritional Needs:  Kcal:  2050-2250 Protein:  80-95 grams Fluid:  >2 L  Derrel Nip, RD, LDN Registered Dietitian After Hours/Weekend Pager # in [place]

## 2020-11-07 NOTE — Progress Notes (Addendum)
Pharmacy Antibiotic Note  [name] is a 75 y.o. male admitted on 11/06/2020 with bacteremia.  Pharmacy has been consulted for cefepime dosing.  Pt was admitted for generalized weakness. He has an extensive past medical history. BCID came back with enterobactericiea family but no specific organism yet on the panel. Cefepime has been ordered empirically until organism is identified.   Plan: Cefepime 2g IV x1 than q12 F/u with wt to determine CrCl  Height: 6\' 1"  (185.4 cm) IBW/kg (Calculated) : 79.9  Temp (24hrs), Avg:97.8 F (36.6 C), Min:97.5 F (36.4 C), Max:98.2 F (36.8 C)  Recent Labs  Lab 11/06/20 1227 11/06/20 1328 11/07/20 0618  WBC 6.2  --  6.3  CREATININE 1.82*  --  1.51*  LATICACIDVEN  --  1.0  --     CrCl cannot be calculated (Unknown ideal weight.).    Allergies  Allergen Reactions  . Candesartan Cilexetil-Hctz Other (See Comments)    Acute renal failure  . Hydrochlorothiazide Other (See Comments)    Acute renal failure  . Shellfish Allergy Other (See Comments)    Gout occurs    Antimicrobials this admission: 5/24 cefepime>>  Dose adjustments this admission:   Microbiology results: 5/23 blood>>1/4 GNR/GPC 5/23 BCID>>enterobacterales class 5/23 urine>> 5/24 blood>>  Onnie Boer, PharmD, [place], AAHIVP, CPP Infectious Disease Pharmacist 11/07/2020 8:43 AM

## 2020-11-07 NOTE — Hospital Course (Addendum)
HFU - OP urology  - d/c HTN meds - aneurysm follow up  - ?colonoscopy   ** COPY and PASTE note from 11/10/20 for baseline   # New 2nd Degree AV Block, Type I  # LAFB # HFpEF ECHO was technically difficult with poor acoustic windows; however, LV EF was 55-60% with G1DD, mild LVH, trivial pericardial effusion and trivial TR but normal pulmonary artery systolic pressures. Atrial sizes and RV function were not able to be assessed. Appears euvolemic. Intermittently bradycardic but asymptomatic from this. - Continue Metoprolol to 12.5mg  BID for now although may need to be discontinued due to low pressures - Continuous telemetry monitoring  - Keep K 4-5 and Mag >2  # Elevated AST, Persistent   # Mixed Hyperbilirubinemia, Resolved   # AUD Patient continues to have a benign abdominal exam without RUQ TTP or Murphy's sign. His AST and bilirubin have normalized although AST remains elevated to 52. He does also have a drop in albumin to 1.8 and thrombocytopenia, concerning for underlying cirrhosis. Ultrasound showed some pericholecystic fluid with cholelithiasis and sludge, although CT and MRI showed no evidence of cholecystitis. May be in the setting of pancreatic disease (lipase 65 without signs of pancreatitis on imaging) vs. Cirrhosis given liver showed significant steatosis. - Continue CIWA for now, although may be able to d/c soon as he is not requiring ativan  - Continue to monitor CMP   # TUD  Patient continues to smoke cigarettes and has >50 pack year history.   5/26: This morning patient states he's continuing to have bowel movements and urinary frequency. Mentions his bowel movements are not diarrhea, usually 3x daily, brown in color. Does state that when he tries to urinate with the catheter on he defecates. Discussed findings of bacteremia, plan to discuss with infectious disease. Denies using powdered milk, cereal.   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 No bowel pathology was identified. Initially, this was most concerning for pancreatic cancer; however, MRI and MRCP did not show any definite pancreatic mass but rather tiny cystic lesions in the head of the pancreas likely representing side branch ectasia and/or multiple small side branch IPMN's. 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 His infection is very likely contributing although is also more likely acute and is most likely from intra-abdominal source. Cannot rule out  - Continue cefepime until sensitivities return  - If he continues to have decreased appetite despite appropriate treatment, will consider colonoscopy to better assess for colon cancer and TEE to assess for endocarditis (less likely)  - Consulted RD, appreciate their recommendations - Encouraged PO intake despite LOA  - Switched to regular diet   # Multiple Aortic Aneurysms  Patient has fusiform aneurysmal dilatation of the infrarenal abdominal aorta measuring 5.3x5.2cm, stable from ~1 month ago. He also has dilation of the right common iliac artery up to 2.0cm  and a small, penetrating ulcer in the distal descending thoracic aorta seen on CT scan.  - Patient already follows closely with vascular surgery - per vascular no acute intervention is needed at this time with surgery  planned only if aneurysm exceeds 5.5cm diameter - Radiology recommend CT vs. MR follow up q6 months     5/30: Doing well this morning. Had EGD and colonoscopy yesterday and reports it went well. He would like home health services. He does not wish to go to SNF. He reports wanting to know what it would take to get home. Prior [facility] services for 30 days.

## 2020-11-07 NOTE — Progress Notes (Signed)
Subjective:   [name] states he is ready to go home. Appears minimal change in symptoms since arrival. Denies chest pain, abdominal pain, nausea, vomiting, fevers, chills, pain or troubles with urinating. Says he hasn't had time to have a bowel movement. Discussed that he possibly has bacteremia and we do not know the source and that a CT scan of his abdomen may help with this. Throughout our conversation, he remained focused on his bed alarms going off and was frustrated with having frequent blood draws. He remained eager to go home although family were very supportive of his stay and workup, concerned about his recent deteriorating health status.   Objective:  Vital signs in last 24 hours: Vitals:   11/07/20 0335 11/07/20 0800 11/07/20 0915 11/07/20 1639  BP: (!) 94/48 (!) 99/59  114/69  Pulse: 64 70  69  Resp: 18 18  18   Temp: (!) 97.5 F (36.4 C) 98.2 F (36.8 C)  98.2 F (36.8 C)  TempSrc: Oral Oral  Oral  SpO2: 98% 99%  98%  Weight:   62.8 kg   Height:       General: Patient is thin and appears chronically ill, although resting comfortably in bed. Eyes: Mild scleral icterus and proptosis. HENT: MMM. Poor dentition. Respiratory: Lungs are CTA, anteriorly. No wheezes, rales, or rhonchi. No increased work of breathing on room air.  Cardiovascular: Rate is normal. Rhythm is irregularly irregular. No audible m/r/g. Both legs are cool to the touch.  Neurological: Alert and oriented. No cranial nerve deficits noted.  MSK: Patient has severe, diffuse cachexia.  Abdominal: Soft, slightly firm although non-distended and non-tender to palpation. Decreased bowel sounds throughout. No guarding or rebound.  Skin: No lesions. No rashes.  Psych: Patient has pressured speech and appears frustrated with flat affect.   Assessment/Plan:  Active Problems:   AKI (acute kidney injury) ([place])   Protein-calorie malnutrition, severe   Bacteremia  # Sepsis Secondary to Enterobacterales  Bacteremia  # Unintentional Weight Loss  # Physical Deconditioning  Patient seems to have an acute on chronic deterioration of his health status with considerable weight loss, generalized weakness and cachexia. Both initial and repeat blood cultures grew gram negative rods with enterobacterales that is mec A positive and staph epidermidis, likely a contaminant. I spoke with ID pharmacy who recommended empiric cefepime until his sensitivities return. His CXR was normal, U/A did have rare bacteria and WBC's although negative leukocytes and nitrites. He has a history of colonic polyposis with 4 sessile polyps on most recent colonoscopy from 2020. It was recommended he have a repeat colonoscopy this year. He has had recent constipation and decreased appetite with down-trending hemoglobin, concerning for colon cancer as etiology. He does also have emphysema as well as hx of lung nodules although these have been stable and he denies recent respiratory symptoms recently. His hepatitis and HIV testing were normal and TSH was unremarkable.  - Hgb A1c is pending  - Urine culture is pending  - Will check CT abdomen/pelvis w/ IV and oral contrast  - Zofran 4mg  PRN for nausea  - Strict I&O   # New 2nd Degree AV Block, Type I  # LAFB # HFpEF Repeat EKG today again showed the above findings with highly variable rates. ECHO was technically difficult with poor acoustic windows; however, LV EF was 55-60% with G1DD, mild LVH, trivial pericardial effusion and trivial TR but normal pulmonary artery systolic pressures. Atrial sizes and RV function were not able to  be assessed. Appears euvolemic. - Decreased Metoprolol to 12.5mg  BID due to low pressures  - Continuous telemetry monitoring  - Keep K 4-5 and Mag >2  # Hx of CAD # NSTEMI Type II  Troponins were slightly elevated although flat on arrival. May be in setting of hypotension and active infection. Patient denies any CP.  - Continue ASA 81mg  daily  - Continue  Crestor 20mg  daily   # AKI on CKD Stage II  # Hypokalemia  Creatinine has improved since admission although remains above baseline ~0.87. His potassium remains low at 3.3 despite BID replacement.  - Will give KCl 20mEq x 2  - Patient given 1L fluid bolus; will await CT results before encouraging PO intake  - Continue to monitor daily renal function   # Elevated Liver Enzymes  # Mixed Hyperbilirubinemia  # AUD Liver enzymes today remain stably mildly elevated (AST>ALT). Total bilirubin has decreased to 1.3, previously mixed. ALP is normal. RUQ ultrasound did show evidence of hepatic steatosis and gallstones and sludge with trace pericholecystic fluid although no other signs of acute cholecystitis without Murphy's sign. He does have low albumin of 2.2 and thrombocytopenia which could be consistent with underlying cirrhosis, especially since family suggest that he continues to drink about a fourth of gin every couple of days. Lipase was mildly elevated at 65 although without pain or WBC, pancreatitis is much less likely.  - Check abdominal CT scan  - Started on CIWA without ativan  - Plan for dietician consultation pending CT results   # Acute on Chronic, Hypoproliferative Macrocytic Anemia  Hgb is stable although low at 11.4 today with elevated MCV. LDH was normal, making hemolysis unlikely. It is possible he has had slow GI bleeding although iron studies were consistent with anemia of chronic disease. Pathologists' smear unrevealing.  - Continue to trend daily hemoglobin   # Hx of HTN # Intermittent Hypotension  Blood pressures have been very soft during admission, likely due to reduced PO intake although arrhythmia may be contributing. Patient takes Toprol-XL 100mg  and amlodipine 10mg  daily at home which also may have contributed to orthostasis and weakness recently. BP's have improved with fluid boluses PRN.  - Will decrease Lopressor to 12.5mg  BID given low pressures earlier today  -  Continue to hold home medications  - Close monitoring   # Chronic Urinary Retention in setting of BPH # Microscopic Hematuria  PCP noted patient struggles with chronic urinary retention which may have contributed to AKI. He was found to have 750cc's on post-void bladder scan. He does have RBC's in urine although no evidence of infection.  - Foley catheter placed - Resumed home tamsulosin  - Would benefit from urology follow up outpatient   # Chronic Pain  Patient takes Norco PRN at home for pain.  - Norco 5-325mg  twice daily PRN for severe pain   # Gout - Will hold allopurinol in acute setting due to AKI  Code Status: Full Code  Diet: HH  Fluids: None  DVT PPx: SCD's   Prior to Admission Living Arrangement: Home alone  Anticipated Discharge Location: Pending PT/OT evaluation  Barriers to Discharge: Continued workup   Jeralyn Bennett, MD 11/07/2020, 5:57 PM Pager: [phone] After 5pm on weekdays and 1pm on weekends: On Call pager [phone]

## 2020-11-07 NOTE — Progress Notes (Signed)
Patient voided 23ml; PVR 730ml. MD paged, waiting for call back.

## 2020-11-07 NOTE — TOC Initial Note (Signed)
Transition of Care [facility]) - Initial/Assessment Note    Patient Details  Name: [name] MRN: [id number] Date of Birth: 04-30-1946  Transition of Care [facility]) CM/SW Contact:    Curlene Labrum, RN Phone Number: 11/07/2020, 2:52 PM  Clinical Narrative:                 Case management presented the patient with [facility] letter.  The patient continues to have medical work up for admission for fatigue and shortness of breath.  The patient lives by himself but receives assistance from a brother and sister who provide transportation to appointments and provision of groceries.  CM and MSW will continue to follow the patient for transitions of care needs.  Expected Discharge Plan: Home/Self Care Barriers to Discharge: Continued Medical Work up   Patient Goals and CMS Choice Patient states their goals for this hospitalization and ongoing recovery are:: Patient wants to feel better. CMS Medicare.gov Compare Post Acute Care list provided to:: Patient    Expected Discharge Plan and Services Expected Discharge Plan: Home/Self Care       Living arrangements for the past 2 months: Apartment                                      Prior Living Arrangements/Services Living arrangements for the past 2 months: Apartment Lives with:: Self Patient language and need for interpreter reviewed:: Yes Do you feel safe going back to the place where you live?: Yes      Need for Family Participation in Patient Care: Yes (Comment) Care giver support system in place?: Yes (comment)   Criminal Activity/Legal Involvement Pertinent to Current Situation/Hospitalization: No - Comment as needed  Activities of Daily Living Home Assistive Devices/Equipment: None ADL Screening (condition at time of admission) Patient's cognitive ability adequate to safely complete daily activities?: Yes Is the patient deaf or have difficulty hearing?: No Does the patient have difficulty seeing, even when wearing  glasses/contacts?: No Does the patient have difficulty concentrating, remembering, or making decisions?: No Patient able to express need for assistance with ADLs?: Yes Does the patient have difficulty dressing or bathing?: No Independently performs ADLs?: No Communication: Independent Dressing (OT): Independent Grooming: Independent Feeding: Independent Bathing: Needs assistance Is this a change from baseline?: Change from baseline, expected to last >3 days Toileting: Needs assistance Is this a change from baseline?: Change from baseline, expected to last >3days In/Out Bed: Needs assistance Is this a change from baseline?: Change from baseline, expected to last >3 days Walks in Home: Needs assistance Is this a change from baseline?: Change from baseline, expected to last >3 days Does the patient have difficulty walking or climbing stairs?: Yes Weakness of Legs: Both Weakness of Arms/Hands: Both  Permission Sought/Granted Permission sought to share information with : Case Manager Permission granted to share information with : Yes, Verbal Permission Granted        Permission granted to share info w Relationship: brother or sister     Emotional Assessment Appearance:: Appears stated age Attitude/Demeanor/Rapport: Gracious Affect (typically observed): Accepting Orientation: : Oriented to Self,Oriented to Place,Oriented to  Time,Oriented to Situation Alcohol / Substance Use: Not Applicable Psych Involvement: No (comment)  Admission diagnosis:  Elevated troponin [R77.8] AKI (acute kidney injury) ([place]) [N17.9] Patient Active Problem List   Diagnosis Date Noted  . Protein-calorie malnutrition, severe 11/07/2020  . AKI (acute kidney injury) ([place]) 11/06/2020  . Unintentional  weight loss 08/14/2020  . Urinary retention 04/02/2018  . Mild alcohol use disorder 12/09/2016  . Premature atrial contractions 07/31/2015  . Chronic use of opiate for therapeutic purpose 05/01/2015  .  Atherosclerosis of aorta ([place]) 01/13/2015  . Prediabetes 12/30/2014  . AAA (abdominal aortic aneurysm) ([place])   . Healthcare maintenance 06/30/2012  . CAD (coronary artery disease) 03/17/2012  . Erectile dysfunction 10/12/2010  . Arthropathy of pelvic region and thigh 05/02/2008  . CKD (chronic kidney disease) stage 2, GFR 60-89 ml/min 08/07/2007  . 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  . Gout 04/18/2006  . Tobacco use disorder 04/18/2006  . Essential hypertension 04/18/2006  . GERD 04/18/2006   PCP:  Axel Filler, MD Pharmacy:   [id number] [place], [place] [address] [place] [address] Phone: [phone] Fax: [phone]  [facility] [place], [place] [address] Phone: [phone] Fax: [phone]     Social Determinants of Health (SDOH) Interventions    Readmission Risk Interventions No flowsheet data found.

## 2020-11-07 NOTE — Care Management Obs Status (Signed)
[facility] NOTIFICATION   Patient Details  Name: [name] MRN: [id number] Date of Birth: Nov 10, 1945   Medicare Observation Status Notification Given:  Yes    Curlene Labrum, RN 11/07/2020, 2:42 PM

## 2020-11-08 ENCOUNTER — Encounter (HOSPITAL_COMMUNITY): Payer: Self-pay | Admitting: *Deleted

## 2020-11-08 ENCOUNTER — Inpatient Hospital Stay (HOSPITAL_COMMUNITY): Payer: Medicare HMO

## 2020-11-08 DIAGNOSIS — I70213 Atherosclerosis of native arteries of extremities with intermittent claudication, bilateral legs: Secondary | ICD-10-CM

## 2020-11-08 DIAGNOSIS — I723 Aneurysm of iliac artery: Secondary | ICD-10-CM | POA: Diagnosis present

## 2020-11-08 DIAGNOSIS — K8689 Other specified diseases of pancreas: Secondary | ICD-10-CM | POA: Diagnosis not present

## 2020-11-08 DIAGNOSIS — N179 Acute kidney failure, unspecified: Secondary | ICD-10-CM | POA: Diagnosis not present

## 2020-11-08 DIAGNOSIS — I81 Portal vein thrombosis: Secondary | ICD-10-CM

## 2020-11-08 DIAGNOSIS — I714 Abdominal aortic aneurysm, without rupture: Secondary | ICD-10-CM

## 2020-11-08 DIAGNOSIS — R7881 Bacteremia: Secondary | ICD-10-CM | POA: Diagnosis not present

## 2020-11-08 LAB — COMPREHENSIVE METABOLIC PANEL
ALT: 39 U/L (ref 0–44)
AST: 52 U/L — ABNORMAL HIGH (ref 15–41)
Albumin: 1.8 g/dL — ABNORMAL LOW (ref 3.5–5.0)
Alkaline Phosphatase: 66 U/L (ref 38–126)
Anion gap: 6 (ref 5–15)
BUN: 16 mg/dL (ref 8–23)
CO2: 27 mmol/L (ref 22–32)
Calcium: 9.2 mg/dL (ref 8.9–10.3)
Chloride: 104 mmol/L (ref 98–111)
Creatinine, Ser: 1.2 mg/dL (ref 0.61–1.24)
GFR, Estimated: >60 mL/min (ref >60)
Glucose, Bld: 102 mg/dL — ABNORMAL HIGH (ref 70–99)
Potassium: 4.2 mmol/L (ref 3.5–5.1)
Sodium: 137 mmol/L (ref 135–145)
Total Bilirubin: 1.1 mg/dL (ref 0.3–1.2)
Total Protein: 5.4 g/dL — ABNORMAL LOW (ref 6.5–8.1)

## 2020-11-08 LAB — CBC
HCT: 29.1 % — ABNORMAL LOW (ref 39.0–52.0)
HCT: 29.6 % — ABNORMAL LOW (ref 39.0–52.0)
Hemoglobin: 9.9 g/dL — ABNORMAL LOW (ref 13.0–17.0)
Hemoglobin: 9.9 g/dL — ABNORMAL LOW (ref 13.0–17.0)
MCH: 34.1 pg — ABNORMAL HIGH (ref 26.0–34.0)
MCH: 33.7 pg (ref 26.0–34.0)
MCHC: 34 g/dL (ref 30.0–36.0)
MCHC: 33.4 g/dL (ref 30.0–36.0)
MCV: 100.3 fL — ABNORMAL HIGH (ref 80.0–100.0)
MCV: 100.7 fL — ABNORMAL HIGH (ref 80.0–100.0)
Platelets: 131 10*3/uL — ABNORMAL LOW (ref 150–400)
Platelets: 131 10*3/uL — ABNORMAL LOW (ref 150–400)
RBC: 2.9 MIL/uL — ABNORMAL LOW (ref 4.22–5.81)
RBC: 2.94 MIL/uL — ABNORMAL LOW (ref 4.22–5.81)
RDW: 13.2 % (ref 11.5–15.5)
RDW: 13.1 % (ref 11.5–15.5)
WBC: 7.1 10*3/uL (ref 4.0–10.5)
WBC: 6.6 10*3/uL (ref 4.0–10.5)
nRBC: 0 % (ref 0.0–0.2)
nRBC: 0 % (ref 0.0–0.2)

## 2020-11-08 LAB — GLUCOSE, CAPILLARY: Glucose-Capillary: 124 mg/dL — ABNORMAL HIGH (ref 70–99)

## 2020-11-08 LAB — URINE CULTURE

## 2020-11-08 MED ORDER — HEPARIN BOLUS VIA INFUSION
3150.0000 [IU] | Freq: Once | INTRAVENOUS | Status: AC
  Administered 2020-11-08: 3150 [IU] via INTRAVENOUS
  Filled 2020-11-08: qty 3150

## 2020-11-08 MED ORDER — GADOBUTROL 1 MMOL/ML IV SOLN
6.0000 mL | Freq: Once | INTRAVENOUS | Status: AC | PRN
  Administered 2020-11-08: 6 mL via INTRAVENOUS

## 2020-11-08 MED ORDER — ALLOPURINOL 100 MG PO TABS
100.0000 mg | ORAL_TABLET | Freq: Every day | ORAL | Status: DC
  Administered 2020-11-08 – 2020-11-12 (×5): 100 mg via ORAL
  Filled 2020-11-08 (×5): qty 1

## 2020-11-08 MED ORDER — LACTATED RINGERS IV BOLUS
1000.0000 mL | Freq: Once | INTRAVENOUS | Status: AC
  Administered 2020-11-08: 1000 mL via INTRAVENOUS

## 2020-11-08 MED ORDER — RAMELTEON 8 MG PO TABS
8.0000 mg | ORAL_TABLET | Freq: Every day | ORAL | Status: DC
  Administered 2020-11-08 – 2020-11-12 (×5): 8 mg via ORAL
  Filled 2020-11-08 (×6): qty 1

## 2020-11-08 MED ORDER — HEPARIN (PORCINE) 25000 UT/250ML-% IV SOLN
1350.0000 [IU]/h | INTRAVENOUS | Status: DC
  Administered 2020-11-08: 1000 [IU]/h via INTRAVENOUS
  Administered 2020-11-09: 1250 [IU]/h via INTRAVENOUS
  Administered 2020-11-10: 1350 [IU]/h via INTRAVENOUS
  Filled 2020-11-08 (×3): qty 250

## 2020-11-08 NOTE — Evaluation (Signed)
Physical Therapy Evaluation Patient Details Name: [name] MRN: [id number] DOB: 10-26-1945 Today's Date: 11/08/2020   History of Present Illness  75 year old male presented to the ED 5/23 with progressive weight loss over the last 6 months and new onset fatigue, dyspnea on exertion and generalized weakness over the last week. Found to have gram-negative bacteremia and AKI and admitted. PMH:  AAA, CKD stage II, CAD, hypertension, colonic polyposis, gout, alcohol use disorder.  Clinical Impression  PTA pt living alone in single story apartment with 4 steps to enter. Pt reports independence with ambulation sometimes using a SPC, as well as independence with bathing and dressing, reports that his sister and brother provide transportation to store and that he has transportation for appointments. Pt does report that the last 3 weeks he has not been able to do those things and that going to get a drink of water requires a 2 hour rest. Sister reports that pt has not been eating lately either. Educated on need to eat and pt reports that also fatigues him. Pt is currently supervision for bed mobility, min guard for transfer and min A for ambulation of 30 feet with RW. Pt does report that his son in [place] has been wanting him to move there. Discussed going to SNF for rehab so that he might get strong enough to make transition to go live with his son. Pt reports it's a possibility. PT will continue to follow acutely.    Follow Up Recommendations SNF    Equipment Recommendations  Rolling walker with 5" wheels    Recommendations for Other Services OT consult     Precautions / Restrictions Precautions Precautions: None Restrictions Weight Bearing Restrictions: No      Mobility  Bed Mobility Overal bed mobility: Needs Assistance Bed Mobility: Supine to Sit;Sit to Supine     Supine to sit: Supervision Sit to supine: Supervision   General bed mobility comments: supervision for safety     Transfers Overall transfer level: Needs assistance Equipment used: Rolling walker (2 wheeled) Transfers: Sit to/from Stand Sit to Stand: Min guard         General transfer comment: min guard for safety, vc for hand placment for power up, able to self steady  Ambulation/Gait Ambulation/Gait assistance: Min assist Gait Distance (Feet): 30 Feet Assistive device: Rolling walker (2 wheeled) Gait Pattern/deviations: Step-through pattern;Trunk flexed;Shuffle;Decreased step length - right;Decreased step length - left Gait velocity: slowed Gait velocity interpretation: <1.31 ft/sec, indicative of household ambulator General Gait Details: contact guard assist for safety, increasingly slowed gait, pt with c/o of fatigue and mild SoB         Balance Overall balance assessment: Needs assistance Sitting-balance support: Feet supported;No upper extremity supported Sitting balance-Leahy Scale: Fair     Standing balance support: Bilateral upper extremity supported;Single extremity supported;During functional activity Standing balance-Leahy Scale: Poor Standing balance comment: requires at least single UE support                             Pertinent Vitals/Pain Pain Assessment: No/denies pain    Home Living Family/patient expects to be discharged to:: Private residence Living Arrangements: Alone Available Help at Discharge: Family;Available PRN/intermittently Type of Home: Apartment Home Access: Stairs to enter Entrance Stairs-Rails: Can reach both Entrance Stairs-Number of Steps: 4 Home Layout: One level Home Equipment: None      Prior Function Level of Independence: Independent         Comments:  until 2-3 weeks ago, ambulating without AD, sister takes him to store, and has transport to appointments     Hand Dominance   Dominant Hand: Right    Extremity/Trunk Assessment   Upper Extremity Assessment Upper Extremity Assessment: Defer to OT evaluation     Lower Extremity Assessment Lower Extremity Assessment: Generalized weakness       Communication   Communication: No difficulties  Cognition Arousal/Alertness: Awake/alert Behavior During Therapy: WFL for tasks assessed/performed;Flat affect Overall Cognitive Status: Impaired/Different from baseline Area of Impairment: Safety/judgement;Problem solving                         Safety/Judgement: Decreased awareness of deficits   Problem Solving: Requires verbal cues;Slow processing General Comments: poor understanding of how his decreased appetite and eating is contributing to his increased weakness      General Comments General comments (skin integrity, edema, etc.): Sister present during session, concerned with pt not eating        Assessment/Plan    PT Assessment Patient needs continued PT services  PT Problem List Decreased strength;Decreased activity tolerance;Decreased mobility;Decreased balance;Decreased safety awareness       PT Treatment Interventions DME instruction;Gait training;Functional mobility training;Therapeutic activities;Therapeutic exercise;Balance training;Cognitive remediation;Patient/family education    PT Goals (Current goals can be found in the Care Plan section)  Acute Rehab PT Goals Patient Stated Goal: get strength back PT Goal Formulation: With patient/family Time For Goal Achievement: 11/22/20 Potential to Achieve Goals: Fair    Frequency Min 3X/week    AM-PAC PT "6 Clicks" Mobility  Outcome Measure Help needed turning from your back to your side while in a flat bed without using bedrails?: None Help needed moving from lying on your back to sitting on the side of a flat bed without using bedrails?: None Help needed moving to and from a bed to a chair (including a wheelchair)?: None Help needed standing up from a chair using your arms (e.g., wheelchair or bedside chair)?: None Help needed to walk in hospital room?: A  Little Help needed climbing 3-5 steps with a railing? : A Lot 6 Click Score: 21    End of Session Equipment Utilized During Treatment: Gait belt Activity Tolerance: Patient limited by fatigue Patient left: in bed;with call bell/phone within reach;with bed alarm set;with family/visitor present Nurse Communication: Mobility status PT Visit Diagnosis: Muscle weakness (generalized) (M62.81);Difficulty in walking, not elsewhere classified (R26.2)    Time: 5852-7782 PT Time Calculation (min) (ACUTE ONLY): 29 min   Charges:   PT Evaluation $PT Eval Moderate Complexity: 1 Mod PT Treatments $Therapeutic Activity: 8-22 mins        Giannamarie Paulus B. Migdalia Dk PT, DPT Acute Rehabilitation Services Pager [phone] Office [phone]   [facility] 11/08/2020, 1:54 PM

## 2020-11-08 NOTE — Evaluation (Signed)
Occupational Therapy Evaluation Patient Details Name: [name] MRN: [id number] DOB: [birth year] Today's Date: 11/08/2020    History of Present Illness 75 year old male presented to the ED 5/23 with progressive weight loss over the last 6 months and new onset fatigue, dyspnea on exertion and generalized weakness over the last week. Found to have gram-negative bacteremia and AKI and admitted. PMH:  AAA, CKD stage II, CAD, hypertension, colonic polyposis, gout, alcohol use disorder.   Clinical Impression   At his baseline, pt is independent in ADL and IADL and lives alone. He is assisted for transportation. Pt presents with poor activity tolerance, generalized weakness and impaired standing balance. He requires up to min assist for OOB mobility and ADL. Pt reports no pain, but feeling tired and unwell. Recommending ST rehab upon discharge. Will follow acutely.     Follow Up Recommendations  Home health OT    Equipment Recommendations  Tub/shower seat    Recommendations for Other Services       Precautions / Restrictions Precautions Precautions: Fall Restrictions Weight Bearing Restrictions: No      Mobility Bed Mobility Overal bed mobility: Modified Independent Bed Mobility: Supine to Sit;Sit to Supine     Supine to sit: Supervision Sit to supine: Supervision   General bed mobility comments: HOB flat, no rail    Transfers Overall transfer level: Needs assistance Equipment used: Rolling walker (2 wheeled) Transfers: Sit to/from Stand Sit to Stand: Min guard         General transfer comment: verbal cues for hand placement    Balance Overall balance assessment: Needs assistance Sitting-balance support: Feet supported;No upper extremity supported Sitting balance-Leahy Scale: Good Sitting balance - Comments: no LOB demonstrating donning socks   Standing balance support: Bilateral upper extremity supported;Single extremity supported;During functional  activity Standing balance-Leahy Scale: Poor Standing balance comment: can stand statically at sink without UE support                           ADL either performed or assessed with clinical judgement   ADL Overall ADL's : Needs assistance/impaired Eating/Feeding: Set up;Bed level   Grooming: Wash/dry hands;Standing;Supervision/safety   Upper Body Bathing: Set up;Sitting   Lower Body Bathing: Supervison/ safety;Sit to/from stand   Upper Body Dressing : Set up;Sitting   Lower Body Dressing: Minimal assistance;Sit to/from Archivist: Ambulation;RW;Minimal assistance   Toileting- Water quality scientist and Hygiene: Min guard;Sit to/from stand       Functional mobility during ADLs: Rolling walker;Minimal assistance       Vision Patient Visual Report: No change from baseline       Perception     Praxis      Pertinent Vitals/Pain Pain Assessment: No/denies pain     Hand Dominance Right   Extremity/Trunk Assessment Upper Extremity Assessment Upper Extremity Assessment: Overall WFL for tasks assessed   Lower Extremity Assessment Lower Extremity Assessment: Generalized weakness       Communication Communication Communication: No difficulties   Cognition Arousal/Alertness: Awake/alert Behavior During Therapy: Flat affect Overall Cognitive Status: Impaired/Different from baseline Area of Impairment: Safety/judgement;Problem solving                         Safety/Judgement: Decreased awareness of deficits   Problem Solving: Slow processing;Requires verbal cues General Comments: pt with vague complaints, reports gradually feeling weaker over the last several months   General Comments  Sister present during session, concerned  with pt not eating    Exercises     Shoulder Instructions      Home Living Family/patient expects to be discharged to:: Private residence Living Arrangements: Alone Available Help at Discharge:  Family;Available PRN/intermittently Type of Home: Apartment Home Access: Stairs to enter Entrance Stairs-Number of Steps: 4 Entrance Stairs-Rails: Can reach both Home Layout: One level     Bathroom Shower/Tub: Teacher, early years/pre: Handicapped height Bathroom Accessibility: Yes   Home Equipment: None          Prior Functioning/Environment Level of Independence: Independent        Comments: sister assists with transportation, pt otherwise independent        OT Problem List: Decreased strength;Decreased activity tolerance;Impaired balance (sitting and/or standing);Decreased cognition;Decreased safety awareness;Decreased knowledge of use of DME or AE      OT Treatment/Interventions: Self-care/ADL training;DME and/or AE instruction;Therapeutic activities;Cognitive remediation/compensation;Patient/family education;Balance training    OT Goals(Current goals can be found in the care plan section) Acute Rehab OT Goals Patient Stated Goal: get strength back OT Goal Formulation: With patient Time For Goal Achievement: 11/22/20 Potential to Achieve Goals: Good ADL Goals Pt Will Perform Grooming: with modified independence;standing Pt Will Transfer to Toilet: with modified independence;ambulating;bedside commode (over toilet) Pt Will Perform Toileting - Clothing Manipulation and hygiene: with modified independence;sit to/from stand Pt Will Perform Tub/Shower Transfer: with supervision;Tub transfer;ambulating;shower seat;rolling walker Additional ADL Goal #1: Pt will state at least 3 energy conservation strategies as instructed. Additional ADL Goal #2: Pt will gather items necessary for ADL with RW modified independently.  OT Frequency: Min 2X/week   Barriers to D/C:            Co-evaluation              AM-PAC OT "6 Clicks" Daily Activity     Outcome Measure Help from another person eating meals?: None Help from another person taking care of personal  grooming?: A Little Help from another person toileting, which includes using toliet, bedpan, or urinal?: A Little Help from another person bathing (including washing, rinsing, drying)?: A Little Help from another person to put on and taking off regular upper body clothing?: None Help from another person to put on and taking off regular lower body clothing?: A Little 6 Click Score: 20   End of Session Equipment Utilized During Treatment: Rolling walker;Gait belt  Activity Tolerance: Patient limited by fatigue Patient left: in bed;with call bell/phone within reach;with bed alarm set  OT Visit Diagnosis: Unsteadiness on feet (R26.81);Other abnormalities of gait and mobility (R26.89);Muscle weakness (generalized) (M62.81);Other symptoms and signs involving cognitive function                Time: 1330-1353 OT Time Calculation (min): 23 min Charges:  OT General Charges $OT Visit: 1 Visit OT Evaluation $OT Eval Moderate Complexity: 1 Mod OT Treatments $Self Care/Home Management : 8-22 mins  Nestor Lewandowsky, OTR/L Acute Rehabilitation Services Pager: [phone] Office: [phone]  Malka So 11/08/2020, 3:10 PM

## 2020-11-08 NOTE — Plan of Care (Signed)
  Problem: Elimination: Goal: Will not experience complications related to bowel motility Outcome: Progressing   

## 2020-11-08 NOTE — Consult Note (Addendum)
Hospital Consult    Reason for Consult:  AAA/portal vein thrombosis Requesting Physician:  Konrad Penta MRN #:  [id number]  History of Present Illness: This is a 75 y.o. male who presented to the hospital yesterday with progressive weight loss despite good appetite and eating, but over the last week, he has noticed that he is more weak, tired and short of breath with minimal exertion.  He has not had fever, chills, chest pain.  He was noted to have an elevated creatinine in the ED.  His creatinine today is 1.2.  He was admitted for further evaluation.  During that evaluation, pt had gotten a CT scan and was found to have a AAA.   Also, blood cx were drawn and he is found to be septic secondary to Enterobacterales bacteremia.  The AAA has been followed by Dr. Scot Dock and was last seen in March 2022 at which time it was 5.2 cm.  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  He explained to the pt at that time, he would be considered for elective repair at 5.5cm.  If his AAA enlarged, he would need a CTA to further evaluated for open vs endovascular repair and was scheduled to come back in 6 months for follow up.  His ABI were normal with multiphasic flow bilaterally and did not think he needed routine follow up for this.  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  Pt states he has not had any abdominal pain.  He does take Norco for chronic back and hip pain.  He states he was told he doesn't get blood flow to his hip.   He states he has had about 80lb weight loss over 4 years but more so lately. He states he quit smoking about 3 weeks ago bc he could not really get the energy up to get his cigarettes.    Pt has hx of CAD with NSTEMI and stenting in 2004, HTN, chronic urinary retention in setting of BPH, chronic pain and gout.  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  Subsequently required rexploration for hemothorax.  Hx of  tracheostomy.    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   The pt is on a statin for cholesterol management.  The pt is on a daily aspirin.   Other AC:  none The pt is on CCB, BB for hypertension.   The pt is not diabetic.   Tobacco hx:  Quit 3 weeks ago  Past Medical History:  Diagnosis Date  . AAA (abdominal aortic aneurysm) (HCC)    noted CT 11/2012 size 3.3 x 3.2 cm  . Alcohol abuse   . Barretts esophagus    bx distal esophagus 2011 neg   . Bladder outlet obstruction 04/02/2018  . Cataract   . Cataract   . CHF (congestive heart failure) ([place])   . Cholelithiasis   . Chronic osteomyelitis involving ankle and foot, right ([place]) 07/13/2018  . 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   2006 - 3 adenomas largest 12 mm  2008 - no polyps 2013-  2 mm cecal polyp, adenoma, routine repeat colonoscopy 02/2017 approx 05/14/2017 18 polyps max 12 mm ALL adenomas recall 2019 -  [name], MD, [facility]     . Diverticulosis of colon   . DJD (degenerative joint disease)  left hip  . Full dentures    upper and lower  . GERD (gastroesophageal reflux disease)    diet controlled   . Gout    feet  . Hiatal hernia    2 cm noted on upper endos 2011   . Hx of adenomatous colonic polyps   . Hyperlipidemia   . Hypertension   . Internal hemorrhoid   . 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 Myocardial infarction [facility]) 2004   hx of, 2 stents 2004. pt was on Plavix x 3 years then transitioned to ASA, no problems since  . Non-small cell carcinoma of right lung, stage 1 ([place]) 01/30/2016  . Renal failure, acute (HCC)    hx of 2/2 medication, resolved  . Tobacco abuse    smoker .5 ppd x 55 yrs  . UTI (lower urinary tract infection)     Past Surgical History:  Procedure Laterality Date  . cataract surgery Bilateral 2018   removal of cataracts  . COLONOSCOPY     hx  polyps - [name] 02/2012  . CORONARY ANGIOPLASTY WITH STENT PLACEMENT    . flexible cystourethroscopy  05/24/06  . INGUINAL HERNIA REPAIR  2005  . LUNG LOBECTOMY Right 11/07   RUL  . MULTIPLE TOOTH EXTRACTIONS    . UPPER GASTROINTESTINAL ENDOSCOPY      Allergies  Allergen Reactions  . Candesartan Cilexetil-Hctz Other (See Comments)    Acute renal failure  . Hydrochlorothiazide Other (See Comments)    Acute renal failure  . Shellfish Allergy Other (See Comments)    Gout occurs    Prior to Admission medications   Medication Sig Start Date End Date Taking? Authorizing Provider  allopurinol (ZYLOPRIM) 100 MG tablet Take 1 tablet (100 mg total) by mouth at bedtime. 11/08/19  Yes Axel Filler, MD  amLODipine (NORVASC) 10 MG tablet Take 1 tablet (10 mg total) by mouth daily. 10/31/20  Yes Axel Filler, MD  aspirin EC 81 MG tablet Take 81 mg by mouth at bedtime.    Yes [provider]  HYDROcodone-acetaminophen (NORCO) 7.5-325 MG tablet Take 1 tablet by mouth 2 (two) times daily as needed for moderate pain. Patient taking differently: Take 1 tablet by mouth in the morning and at bedtime. 08/14/20  Yes Axel Filler, MD  metoprolol succinate (TOPROL-XL) 100 MG 24 hr tablet Take 1 tablet (100 mg total) by mouth daily. Take with or immediately following a meal. Patient taking differently: Take 100 mg by mouth at bedtime. 10/31/20  Yes Axel Filler, MD  Multiple Vitamin (MULTIVITAMIN WITH MINERALS) TABS tablet Take 1 tablet by mouth daily.   Yes [provider]  rosuvastatin (CRESTOR) 20 MG tablet TAKE 1 TABLET EVERY DAY Patient taking differently: Take 20 mg by mouth at bedtime. 08/28/20  Yes Axel Filler, MD  tamsulosin (FLOMAX) 0.4 MG CAPS capsule TAKE 2 CAPSULES (0.8 MG TOTAL) BY MOUTH EVERY EVENING. Patient taking differently: Take 0.8 mg by mouth at bedtime. 08/28/20  Yes Axel Filler, MD  albuterol (PROVENTIL  HFA;VENTOLIN HFA) 108 (90 Base) MCG/ACT inhaler Inhale 2 puffs into the lungs every 6 (six) hours as needed for wheezing or shortness of breath. Patient not taking: No sig reported 06/02/17   Axel Filler, MD  docusate sodium (COLACE) 100 MG capsule Take 1 capsule (100 mg total) by mouth 2 (two) times daily as needed for mild constipation. Patient not taking: No sig reported  07/25/18   Street, [place], PA-C  OVER THE COUNTER MEDICATION Take 1 tablet by mouth 2 (two) times daily as needed (pain). Pain medication that dissolves like alka-seltzer - from [facility] Patient not taking: Reported on 11/06/2020    [provider]  polyethylene glycol (MIRALAX / GLYCOLAX) packet Take 17 g by mouth daily. Take daily until you achieve soft daily bowel movements, then titrate dosing to continue having daily soft bowel movements Patient not taking: No sig reported 07/25/18   Street, [place], [place]    Social History   Socioeconomic History  . Marital status: Single    Spouse name: Not on file  . Number of children: 0  . Years of education: Not on file  . Highest education level: Not on file  Occupational History  . Not on file  Tobacco Use  . Smoking status: Current Every Day Smoker    Packs/day: 0.25    Years: 55.00    Pack years: 13.75    Types: Cigarettes  . Smokeless tobacco: Never Used  Vaping Use  . Vaping Use: Never used  Substance and Sexual Activity  . Alcohol use: Yes    Alcohol/week: 0.0 standard drinks    Comment: drinking 1-2 pts gin/week, (mixed with tap water not tonic)  . Drug use: No  . Sexual activity: Not on file  Other Topics Concern  . Not on file  Social History Narrative  . Not on file   Social Determinants of Health   Financial Resource Strain: Not on file  Food Insecurity: Not on file  Transportation Needs: Not on file  Physical Activity: Not on file  Stress: Not on file  Social Connections: Not on file  Intimate Partner Violence: Not on file     Family History  Problem Relation Age of Onset  . Diabetes Mother   . Hypertension Sister   . Heart disease Sister   . Diabetes Sister   . Arthritis Brother   . Gout Brother   . Colon cancer Neg Hx   . Esophageal cancer Neg Hx   . Rectal cancer Neg Hx   . Stomach cancer Neg Hx   . Colon polyps Neg Hx     ROS: [x]  Positive   [ ]  Negative   [ ]  All sytems reviewed and are negative  Cardiac: []  chest pain/pressure []  palpitations []  SOB lying flat []  DOE  Vascular: []  pain in legs while walking []  pain in legs at rest []  pain in legs at night []  non-healing ulcers []  hx of DVT []  swelling in legs  Pulmonary: []  asthma/wheezing []  home O2 [x]  hx tracheostomy  Neurologic: []  hx of CVA []  mini stroke   Hematologic: [x]  hx of cancer-SCC stage one of lung  Endocrine:   []  diabetes []  thyroid disease  GI []  GERD  GU: [x]  BPH  [x]  AKI  Psychiatric: []  anxiety []  depression  Musculoskeletal: []  arthritis []  joint pain  Integumentary: []  rashes []  ulcers  Constitutional: []  fever  []  chills  Physical Examination  Vitals:   11/08/20 0412 11/08/20 0936  BP: (!) 91/57 106/77  Pulse: 75 92  Resp: 19 19  Temp: 98.3 F (36.8 C) 97.8 F (36.6 C)  SpO2: 99% 98%   Body mass index is 18.27 kg/m.  General:  WDWN in NAD Gait: Not observed HENT: WNL, normocephalic Pulmonary: normal non-labored breathing Cardiac: regular, without Murmur; without carotid bruits Abdomen:  soft, NT/ND; aortic pulse is faintly palpable Skin: without rashes Vascular Exam/Pulses:  Right Left  Radial 2+ (normal) Unable to palpate  Femoral 2+ (normal) 2+ (normal)  Popliteal Unable to palpate 2+ (normal)  DP Unable to palpate Unable to palpate  PT Unable to palpate Unable to palpate   Extremities: bilateral feet are warm and well perfused without ischemic changes, without Gangrene , without cellulitis; without open wounds Musculoskeletal: no muscle wasting or  atrophy  Neurologic: A&O X 3; speech is fluent/normal Psychiatric:  The pt has Normal affect.   CBC    Component Value Date/Time   WBC 7.1 11/08/2020 0408   RBC 2.90 (L) 11/08/2020 0408   HGB 9.9 (L) 11/08/2020 0408   HGB 12.8 (L) 08/14/2020 1111   HGB 13.7 05/06/2017 0926   HCT 29.1 (L) 11/08/2020 0408   HCT 37.3 (L) 08/14/2020 1111   HCT 41.9 05/06/2017 0926   PLT 131 (L) 11/08/2020 0408   PLT 100 (LL) 08/14/2020 1111   MCV 100.3 (H) 11/08/2020 0408   MCV 100 (H) 08/14/2020 1111   MCV 100.7 (H) 05/06/2017 0926   MCH 34.1 (H) 11/08/2020 0408   MCHC 34.0 11/08/2020 0408   RDW 13.2 11/08/2020 0408   RDW 13.0 08/14/2020 1111   RDW 14.5 05/06/2017 0926   LYMPHSABS 0.8 11/06/2020 1227   LYMPHSABS 1.0 05/06/2017 0926   MONOABS 0.4 11/06/2020 1227   MONOABS 0.4 05/06/2017 0926   EOSABS 0.1 11/06/2020 1227   EOSABS 0.1 05/06/2017 0926   BASOSABS 0.0 11/06/2020 1227   BASOSABS 0.0 05/06/2017 0926    BMET    Component Value Date/Time   NA 137 11/08/2020 0408   NA 142 08/14/2020 1111   NA 142 05/06/2017 0926   K 4.2 11/08/2020 0408   K 4.0 05/06/2017 0926   CL 104 11/08/2020 0408   CL 101 01/25/2011 0901   CO2 27 11/08/2020 0408   CO2 27 05/06/2017 0926   GLUCOSE 102 (H) 11/08/2020 0408   GLUCOSE 108 05/06/2017 0926   GLUCOSE 112 01/25/2011 0901   BUN 16 11/08/2020 0408   BUN 11 08/14/2020 1111   BUN 14.6 05/06/2017 0926   CREATININE 1.20 11/08/2020 0408   CREATININE 1.1 05/06/2017 0926   CALCIUM 9.2 11/08/2020 0408   CALCIUM 9.7 05/06/2017 0926   GFRNONAA >60 11/08/2020 0408   GFRNONAA >89 12/29/2014 0910   GFRAA >60 02/14/2020 1020   GFRAA >89 12/29/2014 0910    COAGS: Lab Results  Component Value Date   INR 1.1 11/07/2020     Non-Invasive Vascular Imaging:   CT abdomen and pelvis 11/07/2020: IMPRESSION: 1. Ill-defined hypovascular area in the head of the pancreas concerning for potential pancreatic neoplasm. Further evaluationwith nonemergent  abdominal MRI with and withoutIV gadolinium withMRCP is recommended in the near future to better evaluate thisfinding. 2. 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 3. Cholelithiasis without evidence of acute cholecystitis at this time. 4. Aortic atherosclerosis, in addition to left main and 3 vessel coronary artery disease. In addition, there is a Research scientist (physical sciences) of the anterior aspect of the distal descending thoracic aortaat the level of the aortic hiatus, fusiform aneurysmal dilatation ofthe infrarenal abdominal aorta which measures up to 5.3 x 5.2 cm indiameter, and aneurysmal dilatation of the right common iliac arterywhich measures 2.0 cm in diameter. Recommend follow-up CT/MR every 6 months and vascular consultation. This recommendation follows ACRconsensus guidelines: White Paper of the ACR Incidental Findings Committee II on Vascular Findings. J Am Coll Radiol 2013; 10:789-794. 5. Colonic diverticulosis without evidence of acute diverticulitis at this time.  6. Additional incidental findings, as above.    ASSESSMENT/is 4 operative intervention of his aneurysm once the diameter becomes greater than 5.5 cm, or if the patient develops a symptomatic aneurysm.  Currently, he does not endorse any abdominal pain at this from an aneurysm perspective he will keep his regular scheduled follow-up with Dr. Doren Custard in approximately 5 months with repeat imaging.  Daily transcranial: This is a 75 y.o. male who was admitted with unintentional weight loss found to have area on CT scan concerning for pancreatic neoplasm.  Pt with known AAA that is being followed by Dr. Scot Dock and VVS is consulted.  AAA -basically unchanged from office visit with Dr. Scot Dock in March 2022.  Discussed with pt and his sister that we would not repair this at this time as he is not symptomatic and has other pressing issues regarding pancreatic mass that is concerning for neoplasm.  Further workup in process  currently.   Pt is scheduled to f/u with Dr. Scot Dock at 6 months to re-evaluate AAA.    PAD -minimal claudication bilateral calves but no rest pain. 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  May constitute anticoagulation.  Dr. Trula Slade on call MD will be by to see pt this afternoon and will provide further guidance about this.    Leontine Locket, PA-C [facility] [phone]   I agree with the above.  I have seen and evaluated the patient.  Briefly this is a 75 year old gentleman who presented to the hospital with progressive weight loss and not feeling well.  He was found to be in acute renal failure and bacteremic.  He underwent CT scan imaging that showed a 5.3 cm infrarenal abdominal aortic aneurysm as well as portal vein thrombus.  The patient was recently seen by Dr. Scot Dock last month at which time his aneurysm measured 5.2 cm.  The plan was for treatment of his aneurysm once the diameter reaches 5.5 cm, or if he develops a symptomatic aneurysm.  The patient will keep his regular scheduled follow-up with Dr. Scot Dock in approximately 5 months.  The patient did have portal vein thrombus on exam in the setting of paralysis pancreatic head which did not appear to be malignant on MRCP.  Again, the patient does not endorse abdominal pain or postprandial pain.  I would recommend anticoagulation propagation and continue with evaluation for the underlying etiology.  ]Wells Gennie Eisinger

## 2020-11-08 NOTE — Progress Notes (Signed)
ANTICOAGULATION CONSULT NOTE - Initial Consult  Pharmacy Consult for IV Heparin Indication:  Portal Vein Thrombosis  Allergies  Allergen Reactions  . Candesartan Cilexetil-Hctz Other (See Comments)    Acute renal failure  . Hydrochlorothiazide Other (See Comments)    Acute renal failure  . Shellfish Allergy Other (See Comments)    Gout occurs    Patient Measurements: Height: 6\' 1"  (185.4 cm) Weight: 62.8 kg (138 lb 7.2 oz) IBW/kg (Calculated) : 79.9 Heparin Dosing Weight:  62.8 kg  Vital Signs: Temp: 98.1 F (36.7 C) (05/25 1700) Temp Source: Oral (05/25 1700) BP: 105/82 (05/25 1700) Pulse Rate: 89 (05/25 1700)  Labs: Recent Labs    11/06/20 1227 11/06/20 1328 11/06/20 1600 11/06/20 1844 11/07/20 0618 11/08/20 0408 11/08/20 1533  HGB 11.7*  --   --   --  11.4* 9.9* 9.9*  HCT 36.6*  --   --   --  34.2* 29.1* 29.6*  PLT 157  --   --   --  135* 131* 131*  APTT  --   --   --   --  33  --   --   LABPROT  --   --   --   --  13.8  --   --   INR  --   --   --   --  1.1  --   --   CREATININE 1.82*  --   --   --  1.51* 1.20  --   TROPONINIHS  --  66* 69* 69*  --   --   --     Estimated Creatinine Clearance: 48 mL/min (by C-G formula based on SCr of 1.2 mg/dL).   Medical History: Past Medical History:  Diagnosis Date  . AAA (abdominal aortic aneurysm) (HCC)    noted CT 11/2012 size 3.3 x 3.2 cm  . Alcohol abuse   . Barretts esophagus    bx distal esophagus 2011 neg   . Bladder outlet obstruction 04/02/2018  . Cataract   . Cataract   . CHF (congestive heart failure) ([place])   . Cholelithiasis   . Chronic osteomyelitis involving ankle and foot, right ([place]) 07/13/2018  . 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   2006 - 3 adenomas largest 12 mm  2008 - no polyps 2013-  2 mm cecal polyp, adenoma, routine repeat colonoscopy 02/2017 approx 05/14/2017 18 polyps max 12 mm ALL adenomas recall 2019 -  [name], MD, [facility]     . Diverticulosis of colon   . DJD  (degenerative joint disease)    left hip  . Full dentures    upper and lower  . GERD (gastroesophageal reflux disease)    diet controlled   . Gout    feet  . Hiatal hernia    2 cm noted on upper endos 2011   . Hx of adenomatous colonic polyps   . Hyperlipidemia   . Hypertension   . Internal hemorrhoid   . 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 Myocardial infarction [facility]) 2004   hx of, 2 stents 2004. pt was on Plavix x 3 years then transitioned to ASA, no problems since  . Non-small cell carcinoma of right lung, stage 1 ([place]) 01/30/2016  . Renal failure, acute (HCC)    hx of 2/2 medication, resolved  . Tobacco abuse    smoker .5 ppd x 55 yrs  . UTI (lower urinary tract infection)  Assessment: 75 yr old man admitted on 11/06/20 with generalized weakness, unintentional weight loss, sepsis, pancreatic mass, AAA (unchanged from 08/2020), non-obstructive portal vein thrombosis confirmed on MRI (poss predisposing factors include underlying malignancy vs cirrhosis given extensive alcohol use). Pharmacy is consulted for IV heparin for portal vein thrombosis. Pt was not on anticoagulant PTA.  H/H 9.9/29.6, plt 131  Goal of Therapy:  Heparin level 0.3-0.7 units/ml Monitor platelets by anticoagulation protocol: Yes   Plan:  Heparin 3150 units IV bolus X 1, followed by heparin infusion at 1000 units/hr Check 8-hr heparin level Monitor daily heparin level, CBC closely Monitor for bleeding F/U transition to oral anticoagulant when able  Gillermina Hu, PharmD, BCPS, [facility] Clinical Pharmacist 11/08/2020,6:12 PM

## 2020-11-08 NOTE — Progress Notes (Signed)
Subjective:   Overnight, MRCP and MRI were ordered due to concern for pancreatic neoplasm.   [name] is doing okay this morning, sitting up with his breakfast. Patient denies abdominal pain, nausea, or back pain. States that he has had multiple bowel movements since admission, denies blood in stool or black stool. Also endorsing frustration over lack of sleep given he is in the hospital. Discussed his CT scan that showed possible pancreatic mass concerning for cancer as well as aortic aneurysm also noted in the past. He expressed understanding and did not seem surprised by the news.  Objective:  Vital signs in last 24 hours: Vitals:   11/07/20 2052 11/08/20 0412 11/08/20 0936 11/08/20 1700  BP: 103/75 (!) 91/57 106/77 105/82  Pulse: 76 75 92 89  Resp: 19 19 19 17   Temp: 98.8 F (37.1 C) 98.3 F (36.8 C) 97.8 F (36.6 C) 98.1 F (36.7 C)  TempSrc: Oral Oral Oral Oral  SpO2: 98% 99% 98% 99%  Weight:      Height:       General: Patient is thin and appears chronically ill, although resting comfortably in bed. Eyes: Scleral icterus resolved. Mild proptosis.   HENT: MMM. Poor dentition. Respiratory: Lungs are CTA, anteriorly. No wheezes, rales, or rhonchi. No increased work of breathing on room air.  Cardiovascular: Rate is intermittently bradycardic. Rhythm is irregularly irregular. No audible m/r/g. Extremities are warm.  Neurological: Alert and oriented. No cranial nerve deficits noted.  MSK: Patient has severe, diffuse cachexia.  Abdominal: Soft, non-tender and not distended. No guarding or rebound. Bowel sounds are decreased throughout.  Skin: No lesions. No rashes.  Psych: Pleasant and cooperative.  Assessment/Plan:  Active Problems:   AKI (acute kidney injury) ([place])   Protein-calorie malnutrition, severe   Bacteremia   Pancreatic mass   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[place])  # Sepsis Secondary to Enterobacterales Bacteremia  # Unintentional Weight Loss  #  Physical Deconditioning  Patient's abdominal CT scan showed an ill-defined hypovascular mass at the head of the pancreas, non-occlusive thrombus of the proximal portal vein, cholelithiasis without evidence of cholecystitis, significant atherosclerosis of the aorta and all coronary vessels, a small penetrating ulcer of the distal descending thoracic aorta and known aneurysmal dilations of the aorta (see below). No bowel pathology was identified. Initially, this was most concerning for pancreatic cancer; however, MRI and MRCP did not show any definite pancreatic mass but rather tiny cystic lesions in the head of the pancreas likely representing side branch ectasia and/or multiple small side branch IPMN's. 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 His infection is very likely contributing although is also more likely acute and is most likely from intra-abdominal source. Cannot rule out  - Continue cefepime until sensitivities return  - If he continues to have decreased appetite despite appropriate treatment, will consider colonoscopy to better assess for colon cancer and TEE to assess for endocarditis (less likely)  - Consulted RD, appreciate their recommendations - Encouraged PO intake despite LOA  - Switched to regular diet   # Non-Obstructive Portal Vein Thrombosis  Confirmed on MRI. Spoke to vascular surgery who recommend therapeutic anticoagulation. It is likely he has a pre-disposing factor such as underlying malignancy vs. Possible cirrhosis  given extensive ETOH use. Repeat Hgb stable at 9.9. - Started on IV heparin per pharmacy  - Continue to monitor daily CBC's   # Multiple Aortic Aneurysms  Patient has fusiform aneurysmal dilatation of the infrarenal abdominal  aorta measuring 5.3x5.2cm, stable from ~1 month ago. He also has dilation of the right common iliac artery up to 2.0cm and a small, penetrating ulcer in the distal descending thoracic aorta seen on CT scan.  - Patient already follows closely with vascular surgery - per vascular no acute intervention is needed at this time with surgery planned only if aneurysm exceeds 5.5cm diameter - Radiology recommend CT vs. MR follow up q6 months   # New 2nd Degree AV Block, Type I  # LAFB # HFpEF ECHO was technically difficult with poor acoustic windows; however, LV EF was 55-60% with G1DD, mild LVH, trivial pericardial effusion and trivial TR but normal pulmonary artery systolic pressures. Atrial sizes and RV function were not able to be assessed. Appears euvolemic. Intermittently bradycardic but asymptomatic from this. - Continue Metoprolol to 12.5mg  BID for now although may need to be discontinued due to low pressures - Continuous telemetry monitoring  - Keep K 4-5 and Mag >2  # Triple Vessel CAD # NSTEMI Type II  Troponins were slightly elevated although flat on arrival. May be in setting of hypotension and active infection. Patient denies any CP although was noted to have significant three vessel including left main calcifications on his CT scan.   - Continue ASA 81mg  daily  - Continue Crestor 20mg  daily   # AKI on CKD Stage II  # Hypokalemia, Resolved  Creatinine has improved since admission although remains above baseline ~0.87.  - Continue to encourage PO intake  - Continue to monitor daily renal function and electrolytes   # Elevated AST  # Mixed Hyperbilirubinemia, Resolved   # AUD Patient continues to have a benign abdominal exam without RUQ TTP or Murphy's sign. His AST and bilirubin have normalized although AST remains elevated to 52. He does also have a drop in albumin to 1.8 and thrombocytopenia, concerning for underlying cirrhosis. Ultrasound showed some pericholecystic fluid with  cholelithiasis and sludge, although CT and MRI showed no evidence of cholecystitis. May be in the setting of pancreatic disease (lipase 65 without signs of pancreatitis on imaging) vs. Cirrhosis given liver showed significant steatosis. - Continue CIWA for now, although may be able to d/c soon as he is not requiring ativan  - Continue to monitor CMP   # Acute on Chronic, Hypoproliferative Macrocytic Anemia # Anemia of Chronic Disease  Hgb dropped again this morning from normal a few months ago, although remains stable at 9.9 without active bleeding noted. May be dilutional. LDH and iron studies normal.  - Continue to trend daily hemoglobin  - Monitor closely for bleeding on IV heparin   # Hx of HTN # Intermittent Hypotension  Blood pressures have been very soft during admission, likely due to reduced PO intake although arrhythmia may be contributing. Patient takes Toprol-XL 100mg  and amlodipine 10mg  daily at home which also may have contributed to orthostasis and weakness recently. BP's have improved with fluid boluses PRN.  - Continue Lopressor 12.5mg  BID for now  - Continue to hold home medications  - Close monitoring   # Chronic Urinary Retention in setting of BPH # Microscopic Hematuria  Patient reportedly has chronic urinary retention and BPH although had refused PSA testing outpatient. Urine culture unfortunately shows multiple species and patient  is currently on cefepime.  - Continue foley catheter  - Continue home tamsulosin  - Would benefit from urology follow up outpatient   # Chronic Pain  Patient takes Norco PRN at home for pain.  - Norco 5-325mg  twice daily PRN for severe pain   # Gout Patient notes he avoids seafoods and certain foods due to gout flares. - Reordered home allopurinol   Code Status: Full Code  Diet: Regular Fluids: None  DVT PPx: Therapeutic heparin   Prior to Admission Living Arrangement: Home alone  Anticipated Discharge Location: PT  recommending SNF (patient hesitantly open to this); OT recommending [facility]  Barriers to Discharge: Continued workup   Jeralyn Bennett, MD 11/08/2020, 6:08 PM Pager: [phone] After 5pm on weekdays and 1pm on weekends: On Call pager [phone]

## 2020-11-08 NOTE — Plan of Care (Signed)
  Problem: Clinical Measurements: Goal: Diagnostic test results will improve Outcome: Progressing   

## 2020-11-09 DIAGNOSIS — R634 Abnormal weight loss: Secondary | ICD-10-CM

## 2020-11-09 DIAGNOSIS — I723 Aneurysm of iliac artery: Secondary | ICD-10-CM

## 2020-11-09 DIAGNOSIS — R7881 Bacteremia: Secondary | ICD-10-CM | POA: Diagnosis not present

## 2020-11-09 DIAGNOSIS — K8689 Other specified diseases of pancreas: Secondary | ICD-10-CM

## 2020-11-09 DIAGNOSIS — N179 Acute kidney failure, unspecified: Secondary | ICD-10-CM

## 2020-11-09 DIAGNOSIS — E43 Unspecified severe protein-calorie malnutrition: Secondary | ICD-10-CM

## 2020-11-09 DIAGNOSIS — R8281 Pyuria: Secondary | ICD-10-CM

## 2020-11-09 LAB — CBC
HCT: 28.5 % — ABNORMAL LOW (ref 39.0–52.0)
Hemoglobin: 9.5 g/dL — ABNORMAL LOW (ref 13.0–17.0)
MCH: 33.8 pg (ref 26.0–34.0)
MCHC: 33.3 g/dL (ref 30.0–36.0)
MCV: 101.4 fL — ABNORMAL HIGH (ref 80.0–100.0)
Platelets: 132 10*3/uL — ABNORMAL LOW (ref 150–400)
RBC: 2.81 MIL/uL — ABNORMAL LOW (ref 4.22–5.81)
RDW: 13.4 % (ref 11.5–15.5)
WBC: 7.5 10*3/uL (ref 4.0–10.5)
nRBC: 0 % (ref 0.0–0.2)

## 2020-11-09 LAB — COMPREHENSIVE METABOLIC PANEL WITH GFR
ALT: 41 U/L (ref 0–44)
AST: 53 U/L — ABNORMAL HIGH (ref 15–41)
Albumin: 1.7 g/dL — ABNORMAL LOW (ref 3.5–5.0)
Alkaline Phosphatase: 76 U/L (ref 38–126)
Anion gap: 3 — ABNORMAL LOW (ref 5–15)
BUN: 13 mg/dL (ref 8–23)
CO2: 29 mmol/L (ref 22–32)
Calcium: 8.8 mg/dL — ABNORMAL LOW (ref 8.9–10.3)
Chloride: 104 mmol/L (ref 98–111)
Creatinine, Ser: 1.12 mg/dL (ref 0.61–1.24)
GFR, Estimated: >60 mL/min
Glucose, Bld: 94 mg/dL (ref 70–99)
Potassium: 3.7 mmol/L (ref 3.5–5.1)
Sodium: 136 mmol/L (ref 135–145)
Total Bilirubin: 0.9 mg/dL (ref 0.3–1.2)
Total Protein: 5.1 g/dL — ABNORMAL LOW (ref 6.5–8.1)

## 2020-11-09 LAB — CULTURE, BLOOD (ROUTINE X 2)
Special Requests: ADEQUATE
Special Requests: ADEQUATE

## 2020-11-09 LAB — PHOSPHORUS
Phosphorus: <1 mg/dL — CL (ref 2.5–4.6)
Phosphorus: 3.4 mg/dL (ref 2.5–4.6)

## 2020-11-09 LAB — HEPARIN LEVEL (UNFRACTIONATED)
Heparin Unfractionated: 0.12 IU/mL — ABNORMAL LOW (ref 0.30–0.70)
Heparin Unfractionated: 0.37 [IU]/mL (ref 0.30–0.70)
Heparin Unfractionated: 0.25 IU/mL — ABNORMAL LOW (ref 0.30–0.70)

## 2020-11-09 LAB — MAGNESIUM: Magnesium: 1.8 mg/dL (ref 1.7–2.4)

## 2020-11-09 MED ORDER — HEPARIN BOLUS VIA INFUSION
2000.0000 [IU] | Freq: Once | INTRAVENOUS | Status: AC
  Administered 2020-11-09: 2000 [IU] via INTRAVENOUS
  Filled 2020-11-09: qty 2000

## 2020-11-09 MED ORDER — POTASSIUM PHOSPHATES 15 MMOLE/5ML IV SOLN
40.0000 mmol | Freq: Once | INTRAVENOUS | Status: AC
  Administered 2020-11-09: 40 mmol via INTRAVENOUS
  Filled 2020-11-09: qty 13.33

## 2020-11-09 MED ORDER — ACETAMINOPHEN 325 MG PO TABS
650.0000 mg | ORAL_TABLET | Freq: Four times a day (QID) | ORAL | Status: DC | PRN
  Administered 2020-11-09: 650 mg via ORAL
  Filled 2020-11-09 (×2): qty 2

## 2020-11-09 NOTE — Progress Notes (Signed)
ANTICOAGULATION CONSULT NOTE - Follow Up Consult  Pharmacy Consult for heparin Indication: portal vein thrombosis  Labs: Recent Labs    11/06/20 1227 11/06/20 1328 11/06/20 1600 11/06/20 1844 11/07/20 0618 11/08/20 0408 11/08/20 1533 11/09/20 0316  HGB 11.7*  --   --   --  11.4* 9.9* 9.9* 9.5*  HCT 36.6*  --   --   --  34.2* 29.1* 29.6* 28.5*  PLT 157  --   --   --  135* 131* 131* 132*  APTT  --   --   --   --  33  --   --   --   LABPROT  --   --   --   --  13.8  --   --   --   INR  --   --   --   --  1.1  --   --   --   HEPARINUNFRC  --   --   --   --   --   --   --  0.12*  CREATININE 1.82*  --   --   --  1.51* 1.20  --   --   TROPONINIHS  --  66* 69* 69*  --   --   --   --     Assessment: 74yo male subtherapeutic on heparin with initial dosing for portal vein thrombosis; no gtt issues or signs of bleeding per RN.  Goal of Therapy:  Heparin level 0.3-0.7 units/ml   Plan:  Will rebolus with heparin 2000 units and increase heparin gtt by 4 units/kg/hr to 1250 units/hr and check level in 6 hours.    Wynona Neat, PharmD, BCPS  11/09/2020,5:02 AM

## 2020-11-09 NOTE — Progress Notes (Signed)
Physical Therapy Treatment Patient Details Name: [name] MRN: [id number] DOB: [birth year] Today's Date: 11/09/2020    History of Present Illness 75 year old male presented to the ED 5/23 with progressive weight loss over the last 6 months and new onset fatigue, dyspnea on exertion and generalized weakness over the last week. Found to have gram-negative bacteremia and AKI and admitted. PMH:  AAA, CKD stage II, CAD, hypertension, colonic polyposis, gout, alcohol use disorder.    PT Comments    Pt reports he is feeling better today. Pt is sitting up in bed with 2 lunch trays in front of him, however he reports that "it just doesn't taste good." Educated pt again on how he needs to start eating a little more even if it doesn't taste great. Pt is making progress towards his mobility goals and is currently supervision for bed mobility, min guard for transfers and contact guard A for ambulation, with 1x LoB requiring heavy min A to steady. D/c plans remain appropriate at this time. PT will continue to follow acutely.    Follow Up Recommendations  SNF     Equipment Recommendations  Rolling walker with 5" wheels    Recommendations for Other Services OT consult     Precautions / Restrictions Precautions Precautions: None Restrictions Weight Bearing Restrictions: Yes    Mobility  Bed Mobility Overal bed mobility: Needs Assistance Bed Mobility: Supine to Sit;Sit to Supine     Supine to sit: Supervision     General bed mobility comments: supervision for safety    Transfers Overall transfer level: Needs assistance Equipment used: Rolling walker (2 wheeled) Transfers: Sit to/from Stand Sit to Stand: Min guard         General transfer comment: min guard for safety  Ambulation/Gait Ambulation/Gait assistance: Min assist Gait Distance (Feet): 70 Feet Assistive device: Rolling walker (2 wheeled) Gait Pattern/deviations: Step-through pattern;Trunk flexed;Shuffle;Decreased  step length - right;Decreased step length - left Gait velocity: slowed Gait velocity interpretation: <1.31 ft/sec, indicative of household ambulator General Gait Details: contact guard assist for safety, increasingly slowed gait, 1x LoB requiring heavy min A to steady       Balance Overall balance assessment: Needs assistance Sitting-balance support: Feet supported;No upper extremity supported Sitting balance-Leahy Scale: Fair     Standing balance support: Bilateral upper extremity supported;Single extremity supported;During functional activity Standing balance-Leahy Scale: Poor Standing balance comment: requires at least single UE support                            Cognition Arousal/Alertness: Awake/alert Behavior During Therapy: WFL for tasks assessed/performed;Flat affect Overall Cognitive Status: Impaired/Different from baseline Area of Impairment: Safety/judgement;Problem solving                         Safety/Judgement: Decreased awareness of deficits   Problem Solving: Requires verbal cues;Slow processing General Comments: continues to have lack of appetite, reports he knows that he needs to eat.         General Comments General comments (skin integrity, edema, etc.): VSS on RA      Pertinent Vitals/Pain Pain Assessment: No/denies pain           PT Goals (current goals can now be found in the care plan section) Acute Rehab PT Goals Patient Stated Goal: get strength back PT Goal Formulation: With patient/family Time For Goal Achievement: 11/22/20 Potential to Achieve Goals: Fair Progress towards PT goals: Progressing toward  goals    Frequency    Min 3X/week      PT Plan Current plan remains appropriate       AM-PAC PT "6 Clicks" Mobility   Outcome Measure  Help needed turning from your back to your side while in a flat bed without using bedrails?: None Help needed moving from lying on your back to sitting on the side of a  flat bed without using bedrails?: None Help needed moving to and from a bed to a chair (including a wheelchair)?: None Help needed standing up from a chair using your arms (e.g., wheelchair or bedside chair)?: None Help needed to walk in hospital room?: A Little Help needed climbing 3-5 steps with a railing? : A Lot 6 Click Score: 21    End of Session Equipment Utilized During Treatment: Gait belt Activity Tolerance: Patient limited by fatigue Patient left: with call bell/phone within reach;in chair;with chair alarm set Nurse Communication: Mobility status PT Visit Diagnosis: Muscle weakness (generalized) (M62.81);Difficulty in walking, not elsewhere classified (R26.2)     Time: 1321-1340 PT Time Calculation (min) (ACUTE ONLY): 19 min  Charges:  $Therapeutic Exercise: 8-22 mins                     Mahaila Tischer B. Migdalia Dk PT, DPT Acute Rehabilitation Services Pager [phone] Office [phone]    [place] 11/09/2020, 1:43 PM

## 2020-11-09 NOTE — Progress Notes (Signed)
[facility] for IV Heparin Indication:  Portal Vein Thrombosis  Allergies  Allergen Reactions  . Candesartan Cilexetil-Hctz Other (See Comments)    Acute renal failure  . Hydrochlorothiazide Other (See Comments)    Acute renal failure  . Shellfish Allergy Other (See Comments)    Gout occurs    Patient Measurements: Height: 6\' 1"  (185.4 cm) Weight: 71.2 kg (156 lb 15.5 oz) IBW/kg (Calculated) : 79.9 Heparin Dosing Weight:  62.8 kg  Vital Signs: Temp: 98.6 F (37 C) (05/26 1642) Temp Source: Oral (05/26 1642) BP: 110/66 (05/26 1642) Pulse Rate: 68 (05/26 1642)  Labs: Recent Labs    11/07/20 0618 11/08/20 0408 11/08/20 1533 11/09/20 0316 11/09/20 1240 11/09/20 1901  HGB 11.4* 9.9* 9.9* 9.5*  --   --   HCT 34.2* 29.1* 29.6* 28.5*  --   --   PLT 135* 131* 131* 132*  --   --   APTT 33  --   --   --   --   --   LABPROT 13.8  --   --   --   --   --   INR 1.1  --   --   --   --   --   HEPARINUNFRC  --   --   --  0.12* 0.37 0.25*  CREATININE 1.51* 1.20  --  1.12  --   --     Estimated Creatinine Clearance: 58.3 mL/min (by C-G formula based on SCr of 1.12 mg/dL).   Medical History: Past Medical History:  Diagnosis Date  . AAA (abdominal aortic aneurysm) ([facility])    noted CT 11/2012 size 3.3 x 3.2 cm  . Alcohol abuse   . Barretts esophagus    bx distal esophagus 2011 neg   . Bladder outlet obstruction 04/02/2018  . Cataract   . Cataract   . CHF (congestive heart failure) ([place])   . Cholelithiasis   . Chronic osteomyelitis involving ankle and foot, right ([place]) 07/13/2018  . 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   2006 - 3 adenomas largest 12 mm  2008 - no polyps 2013-  2 mm cecal polyp, adenoma, routine repeat colonoscopy 02/2017 approx 05/14/2017 18 polyps max 12 mm ALL adenomas recall 2019 -  [name], MD, [facility]     . Diverticulosis of colon   . DJD (degenerative joint disease)    left hip  . Full dentures    upper and  lower  . GERD (gastroesophageal reflux disease)    diet controlled   . Gout    feet  . Hiatal hernia    2 cm noted on upper endos 2011   . Hx of adenomatous colonic polyps   . Hyperlipidemia   . Hypertension   . Internal hemorrhoid   . 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 Myocardial infarction [facility]) 2004   hx of, 2 stents 2004. pt was on Plavix x 3 years then transitioned to ASA, no problems since  . Non-small cell carcinoma of right lung, stage 1 ([place]) 01/30/2016  . Renal failure, acute (H[facility])    hx of 2/2 medication, resolved  . Tobacco abuse    smoker .5 ppd x 55 yrs  . UTI (lower urinary tract infection)     Assessment: 75 yr old man admitted on 11/06/20 with generalized weakness, unintentional weight loss, sepsis, pancreatic mass, AAA (unchanged from 08/2020), non-obstructive portal vein thrombosis confirmed on  MRI (poss predisposing factors include underlying malignancy vs cirrhosis given extensive alcohol use). Pharmacy is consulted for IV heparin for portal vein thrombosis. Pt was not on anticoagulant PTA.  H/H 9.5/28.5, plt 132  Heparin level this evening is slightly below goal at 0.25.  No overt bleeding or complications noted.  No known issues with IV infusion.  Goal of Therapy:  Heparin level 0.3-0.7 units/ml Monitor platelets by anticoagulation protocol: Yes   Plan:  Increase IV heparin to 1350 units/hr. Repeat heparin level with AM labs. Monitor daily heparin level, CBC closely Monitor for bleeding F/U transition to oral anticoagulant when able  [place] Crane, Vena Austria, BCPS, BCCP Clinical Pharmacist  11/09/2020 8:22 PM   [facility] pharmacy phone numbers are listed on [email or web address]

## 2020-11-09 NOTE — Progress Notes (Signed)
Subjective:   This morning, [name] was informed of his CT as well as MRI results. We discussed his bacteremia and that we do not yet have a clear source of infection. He was excited he got to eat a hamburger last night although otherwise does not have an appetite. He says he was able to have 3 small, brown, round stools since yesterday. Denies any dark or bloody stools, any abdominal pain, nausea, vomiting, or any other symptoms other than persistent generalized weakness.   Objective:  Vital signs in last 24 hours: Vitals:   11/08/20 2200 11/09/20 0457 11/09/20 0932 11/09/20 1642  BP:  105/68 105/62 110/66  Pulse:  73 64 68  Resp:   18 18  Temp:  98.3 F (36.8 C) 98.1 F (36.7 C) 98.6 F (37 C)  TempSrc:  Oral Oral Oral  SpO2:  100% 98% 96%  Weight: 65.2 kg 71.2 kg    Height:       General: Patient is thin and appears chronically ill, although resting comfortably in bed. Eyes: Mild proptosis, No scleral icterus.  HENT: MMM. Poor dentition. Respiratory: Lungs are CTA, anteriorly. No wheezes, rales, or rhonchi. No increased work of breathing on room air.  Cardiovascular: Rate is bradycardic. Rhythm is irregularly irregular. No audible m/r/g.  Neurological: Alert and oriented. No cranial nerve deficits noted.  MSK: Patient has severe, diffuse cachexia and generalized weakness. Abdominal: Soft, non-tender and not distended. No guarding or rebound. Bowel sounds are decreased throughout.  Skin: No lesions. No rashes.  Psych: Pleasant and cooperative.  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 He remains afebrile and HDS with normal WBC. ID consulted, who believe source is most likely urinary given pyuria here with chronic retention vs. Due to gut translocation in the setting of possible cirrhosis. It is possible his ongoing weight loss may be in the setting of cirrhosis itself (hepatic steatosis noted on imaging) or portal vein thrombosis although less likely as it is non-obstructive. His imaging did not show any other obvious source of infection or evidence of malignancy, although he is at higher risk of colon cancer due to colonic polyposis, so will consider inpatient colonoscopy if his appetite does not improve much despite antibiotics or he has additional changes to stool. 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  - Consulted ID, appreciate their recommendations  - For now, continue cefepime  - Recommend switching over to oral ABX if he improves for a total of 7 days of treatment  - If he continues to have decreased appetite despite appropriate treatment, will consider colonoscopy to better assess for colon cancer and possible TEE to assess for endocarditis (although much less likely)  - Consulted RD, appreciate their recommendations - Encouraged PO intake despite LOA  - Continue regular diet   # Non-Obstructive Portal Vein Thrombosis  Confirmed on MRI. It is likely he has a pre-disposing factor such as underlying malignancy vs. Cirrhosis. Hgb today is slightly lower at 9.5 but overall stable.  - Vascular surgery following, appreciate their recommendations  -  Continue IV heparin per pharmacy  - Continue to monitor daily CBC's   # Multiple Aortic Aneurysms  Hemoglobin lower with concern for aortic ulcer, although stable so may be due to dilution. Overall aneurysms are stable.  - Vascular surgery plan for continued outpatient surveillance   # New 2nd Degree AV Block,  Type I w/ Bradycardia  # LAFB # HFpEF Patient has mild LVH and G1DD but no obvious structural heart abnormalities, although ECHO had poor windows. Repeat EKG today may be more consistent with Type 1 AV block with PVC's, although abnormal Type II block is also possible. Does not have advanced heart block requiring further intervention at this time, and his bradycardia is mild and asymptomatic.  - Continue Metoprolol to 12.5mg  BID for now although may need to be discontinued due to low pressures  - Continuous telemetry monitoring  - Keep K 4-5 and Mag >2  # Triple Vessel CAD # NSTEMI Type II  Troponins were slightly elevated although flat on arrival. May be in setting of hypotension and active infection. Patient denies any CP although was noted to have significant three vessel including left main calcifications on his CT scan.   - May benefit from cardiology evaluation if his symptoms do not improve on ABX given severe disease on CT scan; CAD has not been assessed since 2016 - Continue ASA 81mg  daily  - Continue Crestor 20mg  daily   # AKI on CKD Stage II  # Severe Phosphatemia (resolved)  # Possible Refeeding Syndrome  Creatinine has improved since admission although remains above baseline ~0.87.  - Continue to encourage PO intake  - Continue to monitor daily renal function and electrolytes   # Elevated AST, Persistent   # Mixed Hyperbilirubinemia, Resolved   # AUD Patient continues to have a benign abdominal exam without RUQ TTP or Murphy's sign. His AST and bilirubin have normalized although AST remains elevated to 52. He does also have a drop in albumin to 1.8 and thrombocytopenia, concerning for underlying cirrhosis. Ultrasound showed some pericholecystic fluid with cholelithiasis and sludge, although CT and MRI showed no evidence of cholecystitis. May be in the setting of pancreatic disease (lipase 65 without signs of pancreatitis on imaging) vs. Cirrhosis given liver showed  significant steatosis. - Will d/c CIWA tomorrow if he does not show signs of withdrawal given normal scores - Continue to monitor CMP   # Acute on Chronic, Hypoproliferative Macrocytic Anemia # Anemia of Chronic Disease  Hgb dropped again this morning from normal a few months ago, although remains stable at 9.5 without active bleeding noted. May be dilutional and due to frequent blood draws. LDH and iron studies normal.  - Continue to trend daily hemoglobin  - Monitor closely for bleeding on IV heparin   # Hx of HTN # Intermittent Hypotension  Blood pressures have been very soft during admission, likely due to reduced PO intake although arrhythmia may be contributing. Patient takes Toprol-XL 100mg  and amlodipine 10mg  daily at home which also may have contributed to orthostasis and weakness recently. BP's have improved with fluid boluses PRN.  - Continue Lopressor 12.5mg  BID for now  - Continue to hold home medications  - Close monitoring   # Chronic Urinary Retention in setting of BPH # Likely Complicated Cystitis  Patient reportedly has chronic urinary retention and BPH although had refused PSA testing outpatient. Urine culture unfortunately shows multiple species and patient is currently on cefepime although repeat cultures pending.  - Continue foley catheter  -  Continue home tamsulosin  - Follow up urine cultures - Would benefit from urology follow up outpatient   # Chronic Pain  Patient takes Norco PRN at home for pain.  - Norco 5-325mg  twice daily PRN for severe pain   # Gout Patient notes he avoids seafoods and certain foods due to gout flares. - Continue home allopurinol   Code Status: Full Code  Diet: Regular Fluids: None  DVT PPx: Therapeutic heparin   Prior to Admission Living Arrangement: Home alone  Anticipated Discharge Location:SNF Barriers to Discharge: Continued workup   Jeralyn Bennett, MD 11/09/2020, 6:01 PM Pager: [phone] After 5pm on weekdays  and 1pm on weekends: On Call pager [phone]

## 2020-11-09 NOTE — Consult Note (Addendum)
Date of Admission:  11/06/2020          Reason for Consult: Enterobacter bacteremia    Referring Provider: Dr Dareen Piano   Assessment:  1. Enterobacter bacteremia with possible source being 2. UTI given pyuria (though he does not volunteer much in the way of symptoms) 3. Staph hominis contamination of 1 blood culture 4. Cystic lesions in the head of the pancreas 5. Portal vein thrombosis 6. Cholelithiasis without cholecystitis 7. Atherosclerosis 8. Colonic diverticulosis with is without diverticulitis 9. Chronic alcohol abuse 10. Right common iliac artery 11. Weight loss worrisome for malignancy 12. Multiple aortic aneurysms   Plan:  1. Continue cefepime for now 2. Repeat blood cultures 3. Will switch over to oral antibiotics as he improves  Active Problems:   AKI (acute kidney injury) ([place])   Protein-calorie malnutrition, severe   Bacteremia   Pancreatic mass   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HCC)   Scheduled Meds: . allopurinol  100 mg Oral QHS  . Chlorhexidine Gluconate Cloth  6 each Topical Q0600  . feeding supplement  237 mL Oral TID BM  . metoprolol tartrate  12.5 mg Oral BID  . multivitamin with minerals  1 tablet Oral Daily  . ramelteon  8 mg Oral QHS  . rosuvastatin  20 mg Oral Daily  . sodium chloride flush  3 mL Intravenous Q12H  . tamsulosin  0.8 mg Oral QPM   Continuous Infusions: . sodium chloride Stopped (11/07/20 2300)  . ceFEPime (MAXIPIME) IV 2 g (11/09/20 0928)  . heparin 1,250 Units/hr (11/09/20 1620)   PRN Meds:.sodium chloride, acetaminophen, HYDROcodone-acetaminophen, ondansetron (ZOFRAN) IV  HPI: [name] is a 75 y.o. male with multiple medical problems including multiple aortic abdominal aneurysms, chronic kidney disease coronary artery disease status post placement of stents hypertension alcohol abuse who came to the ER with worsening weakness and fatigue.  He is also reported progressive weight loss over the past 6  months despite having good appetite and eating well.  In ER he is found to have acute on chronic renal insufficiency.    He did have some pyuria on urinary analysis though in talking to him I am having a hard time eliciting story of urinary symptoms.  When I took a history from him today at the bedside he did seem a bit confused in giving his responses to my questions about how he had come to the ER and what symptoms he was suffering from.  In any case blood cultures were taken and they have subsidy grown E. coli.  He was started on cefepime after blood cultures came back positive.  One of the cultures is containing a contaminant Staph hominis.  His urinalysis did show pyuria with lots of epithelial cells.  The culture grew multiple multiple organisms likely due to poor collection.  He has had imaging of his abdomen pelvis with a CT scan which is shown an area that was concerning for pancreatic malignancy.  He was also found to have a portal vein thrombosis, cholelithiasis without cholecystitis diverticulosis without diverticulitis.  Ultrasound shows fatty changes in the liver.  He has multiple abdominal aortic aneurysms has been followed by vascular surgery.  MRI of the pancreas done as an inpatient does not show a pancreatic malignancy but multiple cystic lesions in the head of the pancreas.  We are consulted with regards to the cause of his gram-negative bacteremia.  Possible sources could include his urine given that he did have pyuria.  The urine cultures were not helpful since they do not appear to have been collected properly.  His abdominal imaging does not show a clear-cut source of infection.  He did has gallstones but no cystitis on imaging and he does not have abdominal pain to suggest this on exam either.  He does have a portal vein thrombosis though it would be very unusual for this to be a septic portal vein thrombosis absent and intra-abdominal infection.  If his liver  is cirrhotic gram-negative bacteremia could also have come from gut translocation.  Regardless I do not think he has any evidence of a deeper source of infection.  I think as he improves on IV cefepime will build to change him over to oral antibiotics to complete a 7-day course of treatment.  His weight loss is certainly quite concerning and I agree with working him up for malignancy including colon cancer.  I spent greater than 80  minutes with the patient including greater than 50% of time in face to face counsel of the patient personally reviewing his radiographs his laboratory data his microbiological data and in coordination of his care.    Review of Systems: Review of Systems  Constitutional: Positive for chills, fever, malaise/fatigue and weight loss. Negative for diaphoresis.  HENT: Negative for congestion, hearing loss, sore throat and tinnitus.   Eyes: Negative for blurred vision and double vision.  Respiratory: Negative for cough, sputum production, shortness of breath and wheezing.   Cardiovascular: Negative for chest pain, palpitations and leg swelling.  Gastrointestinal: Negative for abdominal pain, blood in stool, constipation, diarrhea, heartburn, melena, nausea and vomiting.  Genitourinary: Negative for dysuria, flank pain and hematuria.  Musculoskeletal: Negative for back pain, falls, joint pain and myalgias.  Skin: Negative for itching and rash.  Neurological: Negative for dizziness, sensory change, focal weakness, loss of consciousness, weakness and headaches.  Endo/Heme/Allergies: Does not bruise/bleed easily.  Psychiatric/Behavioral: Negative for depression, memory loss and suicidal ideas. The patient is not nervous/anxious.     Past Medical History:  Diagnosis Date  . AAA (abdominal aortic aneurysm) (HCC)    noted CT 11/2012 size 3.3 x 3.2 cm  . Alcohol abuse   . Barretts esophagus    bx distal esophagus 2011 neg   . Bladder outlet obstruction 04/02/2018  .  Cataract   . Cataract   . CHF (congestive heart failure) ([place])   . Cholelithiasis   . Chronic osteomyelitis involving ankle and foot, right ([place]) 07/13/2018  . 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   2006 - 3 adenomas largest 12 mm  2008 - no polyps 2013-  2 mm cecal polyp, adenoma, routine repeat colonoscopy 02/2017 approx 05/14/2017 18 polyps max 12 mm ALL adenomas recall 2019 -  [name], MD, [facility]     . Diverticulosis of colon   . DJD (degenerative joint disease)    left hip  . Full dentures    upper and lower  . GERD (gastroesophageal reflux disease)    diet controlled   . Gout    feet  . Hiatal hernia    2 cm noted on upper endos 2011   . Hx of adenomatous colonic polyps   . Hyperlipidemia   . Hypertension   . Internal hemorrhoid   . 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 Myocardial infarction [facility]) 2004   hx of, 2 stents 2004. pt was on Plavix x 3 years then transitioned to ASA,  no problems since  . Non-small cell carcinoma of right lung, stage 1 ([place]) 01/30/2016  . Renal failure, acute (HCC)    hx of 2/2 medication, resolved  . Tobacco abuse    smoker .5 ppd x 55 yrs  . UTI (lower urinary tract infection)     Social History   Tobacco Use  . Smoking status: Current Every Day Smoker    Packs/day: 0.25    Years: 55.00    Pack years: 13.75    Types: Cigarettes  . Smokeless tobacco: Never Used  Vaping Use  . Vaping Use: Never used  Substance Use Topics  . Alcohol use: Yes    Alcohol/week: 0.0 standard drinks    Comment: drinking 1-2 pts gin/week, (mixed with tap water not tonic)  . Drug use: No    Family History  Problem Relation Age of Onset  . Diabetes Mother   . Hypertension Sister   . Heart disease Sister   . Diabetes Sister   . Arthritis Brother   . Gout Brother   . Colon cancer Neg Hx   . Esophageal cancer Neg Hx   . Rectal cancer Neg Hx   . Stomach cancer Neg Hx   . Colon polyps Neg Hx     Allergies  Allergen Reactions  . Candesartan Cilexetil-Hctz Other (See Comments)    Acute renal failure  . Hydrochlorothiazide Other (See Comments)    Acute renal failure  . Shellfish Allergy Other (See Comments)    Gout occurs    OBJECTIVE: Blood pressure 110/66, pulse 68, temperature 98.6 F (37 C), temperature source Oral, resp. rate 18, height 6\' 1"  (1.854 m), weight 71.2 kg, SpO2 96 %.  Physical Exam HENT:     Head: Normocephalic and atraumatic.  Eyes:     Conjunctiva/sclera: Conjunctivae normal.  Cardiovascular:     Rate and Rhythm: Normal rate and regular rhythm.     Heart sounds: Normal heart sounds. No murmur heard. No friction rub. No gallop.   Pulmonary:     Effort: Pulmonary effort is normal. No respiratory distress.     Breath sounds: Normal breath sounds. No stridor. No wheezing or rhonchi.  Abdominal:     General: Abdomen is flat. There is no distension.     Palpations: Abdomen is soft. There is no mass.     Hernia: No hernia is present.  Musculoskeletal:        General: No tenderness. Normal range of motion.     Cervical back: Normal range of motion and neck supple.  Skin:    General: Skin is warm and dry.     Coloration: Skin is not pale.     Findings: No erythema or rash.  Neurological:     General: No focal deficit present.     Mental Status: He is alert and oriented to person, place, and time.  Psychiatric:        Attention and Perception: Attention normal.        Mood and Affect: Mood normal.        Speech: Speech is delayed.        Behavior: Behavior normal.        Cognition and Memory: He exhibits impaired recent memory.     Lab Results Lab Results  Component Value Date   WBC 7.5 11/09/2020   HGB 9.5 (L) 11/09/2020   HCT 28.5 (L) 11/09/2020   MCV 101.4 (H) 11/09/2020   PLT 132 (L) 11/09/2020    Lab Results  Component Value Date   CREATININE 1.12 11/09/2020   BUN 13 11/09/2020   NA 136 11/09/2020   K 3.7 11/09/2020   CL 104  11/09/2020   CO2 29 11/09/2020    Lab Results  Component Value Date   ALT 41 11/09/2020   AST 53 (H) 11/09/2020   ALKPHOS 76 11/09/2020   BILITOT 0.9 11/09/2020     Microbiology: Recent Results (from the past 240 hour(s))  Blood culture (routine x 2)     Status: Abnormal   Collection Time: 11/06/20  1:19 PM   Specimen: BLOOD  Result Value Ref Range Status   Specimen Description BLOOD LEFT ANTECUBITAL  Final   Special Requests   Final    BOTTLES DRAWN AEROBIC AND ANAEROBIC Blood Culture adequate volume   Culture  Setup Time   Final    GRAM NEGATIVE RODS IN BOTH AEROBIC AND ANAEROBIC BOTTLES    Culture (A)  Final    ENTEROBACTER SAKAZAKII SUSCEPTIBILITIES PERFORMED ON PREVIOUS CULTURE WITHIN THE LAST 5 DAYS. Performed at [facility], [place] [address] [place], [place] [address]    Report Status 11/09/2020 FINAL  Final  Blood culture (routine x 2)     Status: Abnormal   Collection Time: 11/06/20  1:28 PM   Specimen: BLOOD  Result Value Ref Range Status   Specimen Description BLOOD RIGHT ANTECUBITAL  Final   Special Requests   Final    BOTTLES DRAWN AEROBIC AND ANAEROBIC Blood Culture adequate volume   Culture  Setup Time   Final    GRAM NEGATIVE RODS GRAM POSITIVE COCCI ANAEROBIC BOTTLE ONLY CRITICAL RESULT CALLED TO, READ BACK BY AND VERIFIED WITH: PHARMD C.AMEND 11/07/2020 AT 0610 A.HUGHES    Culture (A)  Final    ENTEROBACTER SAKAZAKII STAPHYLOCOCCUS HOMINIS THE SIGNIFICANCE OF ISOLATING THIS ORGANISM FROM A SINGLE SET OF BLOOD CULTURES WHEN MULTIPLE SETS ARE DRAWN IS UNCERTAIN. PLEASE NOTIFY THE MICROBIOLOGY DEPARTMENT WITHIN ONE WEEK IF SPECIATION AND SENSITIVITIES ARE REQUIRED. Performed at [facility], [place] [address] [place], [place] [address]    Report Status 11/09/2020 FINAL  Final   Organism ID, Bacteria ENTEROBACTER SAKAZAKII  Final      Susceptibility   Enterobacter sakazakii - MIC*    CEFAZOLIN 16 SENSITIVE Sensitive     CEFEPIME <=0.12  SENSITIVE Sensitive     CEFTAZIDIME <=1 SENSITIVE Sensitive     CEFTRIAXONE <=0.25 SENSITIVE Sensitive     CIPROFLOXACIN <=0.25 SENSITIVE Sensitive     GENTAMICIN <=1 SENSITIVE Sensitive     IMIPENEM 0.5 SENSITIVE Sensitive     TRIMETH/SULFA <=20 SENSITIVE Sensitive     PIP/TAZO <=4 SENSITIVE Sensitive     * ENTEROBACTER SAKAZAKII  Blood Culture ID Panel (Reflexed)     Status: Abnormal   Collection Time: 11/06/20  1:28 PM  Result Value Ref Range Status   Enterococcus faecalis NOT DETECTED NOT DETECTED Final   Enterococcus Faecium NOT DETECTED NOT DETECTED Final   Listeria monocytogenes NOT DETECTED NOT DETECTED Final   Staphylococcus species DETECTED (A) NOT DETECTED Final    Comment: CRITICAL RESULT CALLED TO, READ BACK BY AND VERIFIED WITH: PHARMD C.AMEND 11/07/2020 AT 0610 A.HUGHES    Staphylococcus aureus (BCID) NOT DETECTED NOT DETECTED Final   Staphylococcus epidermidis DETECTED (A) NOT DETECTED Final    Comment: Methicillin (oxacillin) resistant coagulase negative staphylococcus. Possible blood culture contaminant (unless isolated from more than one blood culture draw or clinical case suggests pathogenicity). No antibiotic treatment is indicated for  blood  culture contaminants. CRITICAL RESULT CALLED TO, READ BACK BY AND VERIFIED WITH: PHARMD C.AMEND 11/07/2020 AT 0610 A.HUGHES    Staphylococcus lugdunensis NOT DETECTED NOT DETECTED Final   Streptococcus species NOT DETECTED NOT DETECTED Final   Streptococcus agalactiae NOT DETECTED NOT DETECTED Final   Streptococcus pneumoniae NOT DETECTED NOT DETECTED Final   Streptococcus pyogenes NOT DETECTED NOT DETECTED Final   A.calcoaceticus-baumannii NOT DETECTED NOT DETECTED Final   Bacteroides fragilis NOT DETECTED NOT DETECTED Final   Enterobacterales DETECTED (A) NOT DETECTED Final    Comment: Enterobacterales represent a large order of gram negative bacteria, not a single organism. Refer to culture for further  identification. CRITICAL RESULT CALLED TO, READ BACK BY AND VERIFIED WITH: PHARMD C.AMEND 11/07/2020 AT 0610 A.HUGHES    Enterobacter cloacae complex NOT DETECTED NOT DETECTED Final   Escherichia coli NOT DETECTED NOT DETECTED Final   Klebsiella aerogenes NOT DETECTED NOT DETECTED Final   Klebsiella oxytoca NOT DETECTED NOT DETECTED Final   Klebsiella pneumoniae NOT DETECTED NOT DETECTED Final   Proteus species NOT DETECTED NOT DETECTED Final   Salmonella species NOT DETECTED NOT DETECTED Final   Serratia marcescens NOT DETECTED NOT DETECTED Final   Haemophilus influenzae NOT DETECTED NOT DETECTED Final   Neisseria meningitidis NOT DETECTED NOT DETECTED Final   Pseudomonas aeruginosa NOT DETECTED NOT DETECTED Final   Stenotrophomonas maltophilia NOT DETECTED NOT DETECTED Final   Candida albicans NOT DETECTED NOT DETECTED Final   Candida auris NOT DETECTED NOT DETECTED Final   Candida glabrata NOT DETECTED NOT DETECTED Final   Candida krusei NOT DETECTED NOT DETECTED Final   Candida parapsilosis NOT DETECTED NOT DETECTED Final   Candida tropicalis NOT DETECTED NOT DETECTED Final   Cryptococcus neoformans/gattii NOT DETECTED NOT DETECTED Final   CTX-M ESBL NOT DETECTED NOT DETECTED Final   Carbapenem resistance IMP NOT DETECTED NOT DETECTED Final   Carbapenem resistance KPC NOT DETECTED NOT DETECTED Final   Methicillin resistance mecA/C DETECTED (A) NOT DETECTED Final    Comment: CRITICAL RESULT CALLED TO, READ BACK BY AND VERIFIED WITH: PHARMD C.AMEND 11/07/2020 AT 0610 A.HUGHES    Carbapenem resistance NDM NOT DETECTED NOT DETECTED Final   Carbapenem resist OXA 48 LIKE NOT DETECTED NOT DETECTED Final   Carbapenem resistance VIM NOT DETECTED NOT DETECTED Final    Comment: Performed at [facility], [place] [address], [place], [place] [address]  Resp Panel by RT-PCR (Flu A&B, Covid) Nasopharyngeal Swab     Status: None   Collection Time: 11/06/20  3:51 PM   Specimen:  Nasopharyngeal Swab; Nasopharyngeal(NP) swabs in vial transport medium  Result Value Ref Range Status   SARS Coronavirus 2 by RT PCR NEGATIVE NEGATIVE Final    Comment: (NOTE) SARS-CoV-2 target nucleic acids are NOT DETECTED.  The SARS-CoV-2 RNA is generally detectable in upper respiratory specimens during the acute phase of infection. The lowest concentration of SARS-CoV-2 viral copies this assay can detect is 138 copies/mL. A negative result does not preclude SARS-Cov-2 infection and should not be used as the sole basis for treatment or other patient management decisions. A negative result may occur with  improper specimen collection/handling, submission of specimen other than nasopharyngeal swab, presence of viral mutation(s) within the areas targeted by this assay, and inadequate number of viral copies(<138 copies/mL). A negative result must be combined with clinical observations, patient history, and epidemiological information. The expected result is Negative.  Fact Sheet for Patients:  EntrepreneurPulse.com.au  Fact Sheet for Healthcare Providers:  IncredibleEmployment.be  This test is no t yet approved or cleared by the [place] and  has been authorized for detection and/or diagnosis of SARS-CoV-2 by FDA under an Emergency Use Authorization (EUA). This EUA will remain  in effect (meaning this test can be used) for the duration of the COVID-19 declaration under Section 564(b)(1) of the Act, 21 U.S.C.section 360bbb-3(b)(1), unless the authorization is terminated  or revoked sooner.       Influenza A by PCR NEGATIVE NEGATIVE Final   Influenza B by PCR NEGATIVE NEGATIVE Final    Comment: (NOTE) The Xpert Xpress SARS-CoV-2/FLU/RSV plus assay is intended as an aid in the diagnosis of influenza from Nasopharyngeal swab specimens and should not be used as a sole basis for treatment. Nasal washings and aspirates are unacceptable for  Xpert Xpress SARS-CoV-2/FLU/RSV testing.  Fact Sheet for Patients: EntrepreneurPulse.com.au  Fact Sheet for Healthcare Providers: IncredibleEmployment.be  This test is not yet approved or cleared by the [place] FDA and has been authorized for detection and/or diagnosis of SARS-CoV-2 by FDA under an Emergency Use Authorization (EUA). This EUA will remain in effect (meaning this test can be used) for the duration of the COVID-19 declaration under Section 564(b)(1) of the Act, 21 U.S.C. section 360bbb-3(b)(1), unless the authorization is terminated or revoked.  Performed at [facility], [place] [address], [place], [place] [address]   Urine culture     Status: Abnormal   Collection Time: 11/07/20  6:19 AM   Specimen: Urine, Random  Result Value Ref Range Status   Specimen Description URINE, RANDOM  Final   Special Requests   Final    NONE Performed at [facility], [place] [address] [place], [place] [address]    Culture MULTIPLE SPECIES PRESENT, SUGGEST RECOLLECTION (A)  Final   Report Status 11/08/2020 FINAL  Final  Culture, blood (routine x 2)     Status: None (Preliminary result)   Collection Time: 11/07/20  8:39 AM   Specimen: BLOOD  Result Value Ref Range Status   Specimen Description BLOOD LEFT ANTECUBITAL  Final   Special Requests   Final    BOTTLES DRAWN AEROBIC AND ANAEROBIC Blood Culture adequate volume   Culture  Setup Time   Final    GRAM NEGATIVE RODS IN BOTH AEROBIC AND ANAEROBIC BOTTLES CRITICAL VALUE NOTED.  VALUE IS CONSISTENT WITH PREVIOUSLY REPORTED AND CALLED VALUE.    Culture   Final    GRAM NEGATIVE RODS IDENTIFICATION TO FOLLOW Performed at [facility], [place] [address], [place], [place] [address]    Report Status PENDING  Incomplete  Culture, blood (routine x 2)     Status: None (Preliminary result)   Collection Time: 11/07/20  8:44 AM   Specimen: BLOOD RIGHT HAND  Result Value Ref Range  Status   Specimen Description BLOOD RIGHT HAND  Final   Special Requests   Final    BOTTLES DRAWN AEROBIC AND ANAEROBIC Blood Culture adequate volume   Culture   Final    NO GROWTH 2 DAYS Performed at [facility], [place] [address] [place], [place] [address]    Report Status PENDING  Incomplete    Alcide Evener, [place] [facility] [facility] [phone] pager  11/09/2020, 5:17 PM

## 2020-11-09 NOTE — Progress Notes (Signed)
ANTICOAGULATION CONSULT NOTE - Initial Consult  Pharmacy Consult for IV Heparin Indication:  Portal Vein Thrombosis  Allergies  Allergen Reactions  . Candesartan Cilexetil-Hctz Other (See Comments)    Acute renal failure  . Hydrochlorothiazide Other (See Comments)    Acute renal failure  . Shellfish Allergy Other (See Comments)    Gout occurs    Patient Measurements: Height: 6\' 1"  (185.4 cm) Weight: 71.2 kg (156 lb 15.5 oz) IBW/kg (Calculated) : 79.9 Heparin Dosing Weight:  62.8 kg  Vital Signs: Temp: 98.1 F (36.7 C) (05/26 0932) Temp Source: Oral (05/26 0932) BP: 105/62 (05/26 0932) Pulse Rate: 64 (05/26 0932)  Labs: Recent Labs    11/06/20 1328 11/06/20 1600 11/06/20 1844 11/07/20 0618 11/07/20 0618 11/08/20 0408 11/08/20 1533 11/09/20 0316 11/09/20 1240  HGB  --   --   --  11.4*   < > 9.9* 9.9* 9.5*  --   HCT  --   --   --  34.2*   < > 29.1* 29.6* 28.5*  --   PLT  --   --   --  135*   < > 131* 131* 132*  --   APTT  --   --   --  33  --   --   --   --   --   LABPROT  --   --   --  13.8  --   --   --   --   --   INR  --   --   --  1.1  --   --   --   --   --   HEPARINUNFRC  --   --   --   --   --   --   --  0.12* 0.37  CREATININE  --   --   --  1.51*  --  1.20  --  1.12  --   TROPONINIHS 66* 69* 69*  --   --   --   --   --   --    < > = values in this interval not displayed.    Estimated Creatinine Clearance: 58.3 mL/min (by C-G formula based on SCr of 1.12 mg/dL).   Medical History: Past Medical History:  Diagnosis Date  . AAA (abdominal aortic aneurysm) (HCC)    noted CT 11/2012 size 3.3 x 3.2 cm  . Alcohol abuse   . Barretts esophagus    bx distal esophagus 2011 neg   . Bladder outlet obstruction 04/02/2018  . Cataract   . Cataract   . CHF (congestive heart failure) ([place])   . Cholelithiasis   . Chronic osteomyelitis involving ankle and foot, right ([place]) 07/13/2018  . 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   2006 - 3 adenomas largest 12 mm  2008  - no polyps 2013-  2 mm cecal polyp, adenoma, routine repeat colonoscopy 02/2017 approx 05/14/2017 18 polyps max 12 mm ALL adenomas recall 2019 -  [name], MD, [facility]     . Diverticulosis of colon   . DJD (degenerative joint disease)    left hip  . Full dentures    upper and lower  . GERD (gastroesophageal reflux disease)    diet controlled   . Gout    feet  . Hiatal hernia    2 cm noted on upper endos 2011   . Hx of adenomatous colonic polyps   . Hyperlipidemia   . Hypertension   . Internal hemorrhoid   .  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 Myocardial infarction [facility]) 2004   hx of, 2 stents 2004. pt was on Plavix x 3 years then transitioned to ASA, no problems since  . Non-small cell carcinoma of right lung, stage 1 ([place]) 01/30/2016  . Renal failure, acute (HCC)    hx of 2/2 medication, resolved  . Tobacco abuse    smoker .5 ppd x 55 yrs  . UTI (lower urinary tract infection)     Assessment: 75 yr old man admitted on 11/06/20 with generalized weakness, unintentional weight loss, sepsis, pancreatic mass, AAA (unchanged from 08/2020), non-obstructive portal vein thrombosis confirmed on MRI (poss predisposing factors include underlying malignancy vs cirrhosis given extensive alcohol use). Pharmacy is consulted for IV heparin for portal vein thrombosis. Pt was not on anticoagulant PTA.  H/H 9.5/28.5, plt 132  Goal of Therapy:  Heparin level 0.3-0.7 units/ml Monitor platelets by anticoagulation protocol: Yes   Plan:  -Heparin level resulted on 5/26 @ 1240 at 0.37 -Continue current heparin infusion at 1250 units/hr -Check confirmatory 6h heparin level Monitor daily heparin level, CBC closely Monitor for bleeding F/U transition to oral anticoagulant when able  Joetta Manners, PharmD, [facility] Clinical Pharmacist  Please check AMION for all [place] phone numbers After 10:00 PM, call [place]

## 2020-11-09 NOTE — Progress Notes (Addendum)
Progress Note    11/09/2020 7:36 AM * No surgery found *  Subjective:  Sleeping soundly; awakens easily.  He denies abdominal pain and hasn't touched his breakfast tray yet but says he will eat. Reports non-bloody BM yesterday   Vitals:   11/08/20 2100 11/09/20 0457  BP: 105/80 105/68  Pulse: 88 73  Resp: 17   Temp: 98 F (36.7 C) 98.3 F (36.8 C)  SpO2: 99% 100%    Physical Exam:  General appearance: Awake, alert in no apparent distress Cardiac: Heart rate and rhythm are regular Respirations: Nonlabored Abdomen: non-distended, non-tender, hypoactive BS   CBC    Component Value Date/Time   WBC 7.5 11/09/2020 0316   RBC 2.81 (L) 11/09/2020 0316   HGB 9.5 (L) 11/09/2020 0316   HGB 12.8 (L) 08/14/2020 1111   HGB 13.7 05/06/2017 0926   HCT 28.5 (L) 11/09/2020 0316   HCT 37.3 (L) 08/14/2020 1111   HCT 41.9 05/06/2017 0926   PLT 132 (L) 11/09/2020 0316   PLT 100 (LL) 08/14/2020 1111   MCV 101.4 (H) 11/09/2020 0316   MCV 100 (H) 08/14/2020 1111   MCV 100.7 (H) 05/06/2017 0926   MCH 33.8 11/09/2020 0316   MCHC 33.3 11/09/2020 0316   RDW 13.4 11/09/2020 0316   RDW 13.0 08/14/2020 1111   RDW 14.5 05/06/2017 0926   LYMPHSABS 0.8 11/06/2020 1227   LYMPHSABS 1.0 05/06/2017 0926   MONOABS 0.4 11/06/2020 1227   MONOABS 0.4 05/06/2017 0926   EOSABS 0.1 11/06/2020 1227   EOSABS 0.1 05/06/2017 0926   BASOSABS 0.0 11/06/2020 1227   BASOSABS 0.0 05/06/2017 0926    BMET    Component Value Date/Time   NA 136 11/09/2020 0316   NA 142 08/14/2020 1111   NA 142 05/06/2017 0926   K 3.7 11/09/2020 0316   K 4.0 05/06/2017 0926   CL 104 11/09/2020 0316   CL 101 01/25/2011 0901   CO2 29 11/09/2020 0316   CO2 27 05/06/2017 0926   GLUCOSE 94 11/09/2020 0316   GLUCOSE 108 05/06/2017 0926   GLUCOSE 112 01/25/2011 0901   BUN 13 11/09/2020 0316   BUN 11 08/14/2020 1111   BUN 14.6 05/06/2017 0926   CREATININE 1.12 11/09/2020 0316   CREATININE 1.1 05/06/2017 0926   CALCIUM  8.8 (L) 11/09/2020 0316   CALCIUM 9.7 05/06/2017 0926   GFRNONAA >60 11/09/2020 0316   GFRNONAA >89 12/29/2014 0910   GFRAA >60 02/14/2020 1020   GFRAA >89 12/29/2014 0910     Intake/Output Summary (Last 24 hours) at 11/09/2020 0736 Last data filed at 11/09/2020 0315 Gross per 24 hour  Intake 892.17 ml  Output 851 ml  Net 41.17 ml    HOSPITAL MEDICATIONS Scheduled Meds: . allopurinol  100 mg Oral QHS  . Chlorhexidine Gluconate Cloth  6 each Topical Q0600  . feeding supplement  237 mL Oral TID BM  . metoprolol tartrate  12.5 mg Oral BID  . multivitamin with minerals  1 tablet Oral Daily  . ramelteon  8 mg Oral QHS  . rosuvastatin  20 mg Oral Daily  . sodium chloride flush  3 mL Intravenous Q12H  . tamsulosin  0.8 mg Oral QPM   Continuous Infusions: . sodium chloride Stopped (11/07/20 2300)  . ceFEPime (MAXIPIME) IV Stopped (11/08/20 2244)  . heparin 1,250 Units/hr (11/09/20 0624)  . 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 Now on anticoagulation via heparin infusion. No abd pain. Mild thrombocytopenia>trend. H and H stable.  AAA: Continue plan for outpatient survellience   -DVT prophylaxis:  On heparin infusion   Risa Grill, PA-C [facility] [phone] 11/09/2020  7:36 AM  I agree with the above.  Nothing further to add from a vascular perspective.  The patient will keep his previously scheduled follow-up for his aneurysm with Dr. Scot Dock in several months.  I have recommended anticoagulation for his portal vein thrombus.  Duration of anticoagulation at this time is unknown as we do not know the etiology of his thrombus.  He is currently undergoing oncologic work-up.  Annamarie Major

## 2020-11-09 NOTE — Plan of Care (Signed)
°  Problem: Coping: °Goal: Level of anxiety will decrease °Outcome: Progressing °  °

## 2020-11-10 ENCOUNTER — Encounter (HOSPITAL_COMMUNITY): Payer: Self-pay | Admitting: Internal Medicine

## 2020-11-10 DIAGNOSIS — K8689 Other specified diseases of pancreas: Secondary | ICD-10-CM | POA: Diagnosis not present

## 2020-11-10 DIAGNOSIS — I723 Aneurysm of iliac artery: Secondary | ICD-10-CM | POA: Diagnosis not present

## 2020-11-10 DIAGNOSIS — R7881 Bacteremia: Secondary | ICD-10-CM | POA: Diagnosis not present

## 2020-11-10 DIAGNOSIS — I81 Portal vein thrombosis: Secondary | ICD-10-CM

## 2020-11-10 DIAGNOSIS — N179 Acute kidney failure, unspecified: Secondary | ICD-10-CM | POA: Diagnosis not present

## 2020-11-10 LAB — COMPREHENSIVE METABOLIC PANEL
ALT: 38 U/L (ref 0–44)
AST: 48 U/L — ABNORMAL HIGH (ref 15–41)
Albumin: 1.7 g/dL — ABNORMAL LOW (ref 3.5–5.0)
Alkaline Phosphatase: 55 U/L (ref 38–126)
Anion gap: 5 (ref 5–15)
BUN: 11 mg/dL (ref 8–23)
CO2: 27 mmol/L (ref 22–32)
Calcium: 8.7 mg/dL — ABNORMAL LOW (ref 8.9–10.3)
Chloride: 106 mmol/L (ref 98–111)
Creatinine, Ser: 1.09 mg/dL (ref 0.61–1.24)
GFR, Estimated: >60 mL/min (ref >60)
Glucose, Bld: 93 mg/dL (ref 70–99)
Potassium: 4 mmol/L (ref 3.5–5.1)
Sodium: 138 mmol/L (ref 135–145)
Total Bilirubin: 0.5 mg/dL (ref 0.3–1.2)
Total Protein: 5.1 g/dL — ABNORMAL LOW (ref 6.5–8.1)

## 2020-11-10 LAB — URINE CULTURE: Culture: NO GROWTH

## 2020-11-10 LAB — HEPARIN LEVEL (UNFRACTIONATED)
Heparin Unfractionated: 0.47 IU/mL (ref 0.30–0.70)
Heparin Unfractionated: 0.51 IU/mL (ref 0.30–0.70)

## 2020-11-10 LAB — CBC
HCT: 27.5 % — ABNORMAL LOW (ref 39.0–52.0)
Hemoglobin: 9.2 g/dL — ABNORMAL LOW (ref 13.0–17.0)
MCH: 33.9 pg (ref 26.0–34.0)
MCHC: 33.5 g/dL (ref 30.0–36.0)
MCV: 101.5 fL — ABNORMAL HIGH (ref 80.0–100.0)
Platelets: 150 10*3/uL (ref 150–400)
RBC: 2.71 MIL/uL — ABNORMAL LOW (ref 4.22–5.81)
RDW: 13.6 % (ref 11.5–15.5)
WBC: 5.4 10*3/uL (ref 4.0–10.5)
nRBC: 0 % (ref 0.0–0.2)

## 2020-11-10 LAB — CANCER ANTIGEN 19-9: CA 19-9: 113 U/mL — ABNORMAL HIGH (ref 0–35)

## 2020-11-10 LAB — CEA: CEA: 3 ng/mL (ref 0.0–4.7)

## 2020-11-10 LAB — PHOSPHORUS: Phosphorus: 2.2 mg/dL — ABNORMAL LOW (ref 2.5–4.6)

## 2020-11-10 LAB — MAGNESIUM: Magnesium: 1.7 mg/dL (ref 1.7–2.4)

## 2020-11-10 MED ORDER — MAGNESIUM SULFATE 2 GM/50ML IV SOLN
2.0000 g | Freq: Once | INTRAVENOUS | Status: AC
  Administered 2020-11-10: 2 g via INTRAVENOUS
  Filled 2020-11-10: qty 50

## 2020-11-10 MED ORDER — SULFAMETHOXAZOLE-TRIMETHOPRIM 800-160 MG PO TABS
1.0000 | ORAL_TABLET | Freq: Two times a day (BID) | ORAL | Status: DC
  Administered 2020-11-11 – 2020-11-13 (×5): 1 via ORAL
  Filled 2020-11-10 (×5): qty 1

## 2020-11-10 MED ORDER — POTASSIUM PHOSPHATES 15 MMOLE/5ML IV SOLN
10.0000 mmol | Freq: Once | INTRAVENOUS | Status: AC
  Administered 2020-11-10: 10 mmol via INTRAVENOUS
  Filled 2020-11-10: qty 3.33

## 2020-11-10 NOTE — Consult Note (Addendum)
Consultation  Referring Provider: Dr. Dareen Piano     Primary Care Physician:  Axel Filler, MD Primary Gastroenterologist:   Dr. [name]      Reason for Consultation: Weight loss, fatigue, anorexia, abnormal imaging of the pancreas            HPI:   [name] is a 75 y.o. male with a past medical history of stage Ib non-small cell lung cancer diagnosed November 2007 status post right upper lobectomy with mediastinal lymph node dissection under the care of Dr. Roxan Hockey, patient refused adjuvant chemotherapy, AAA, CKD stage II, CAD (11/06/2020 echo with an LVEF 55-60%), hypertension, colonic polyposis, gout, alcohol use disorder and others listed below, who presented to the ED on 11/07/2020 with worsening weakness over the last week.    At time of admission patient describes that for the past 6 months he had noted progressive weight loss despite a good appetite and eating well with weakness fatigue and dyspnea on minimal exertion.  Also had some associated constipation with his last bowel movement 5 days prior to that.  At time of mission patient found to have gram-negative bacteremia.  He was started on Cefepime.    Today, the patient tells me that over the past couple of years he has just been slowly losing weight.  Apparently he has been following with his PCP in regards to this but describes about a 60 pound weight loss at this point without trying.  Does describe that he feels like he has had the same amount of appetite as normal and cooks for himself at home and has been eating.  Over the past month and a half though he has noticed an increase in fatigue and tells me that he has just had "no energy".  Explains that he would go and get himself a glass of water at the sink but would not even have enough energy to bring the glasses back over when he was done.  Also has not been taking out his trash which he was normally able to do for himself.  Denies any other real complaints or  concerns, no pains or other symptoms.    Denies fever, chills, abdominal pain, heartburn, reflux or change in bowel habits.  Hospital course: 11/06/2020 right upper quadrant ultrasound with heterogenous increased hepatic parenchyma negative for any typical steatosis, stones and sludge within the gallbladder with trace pericholecystic fluid, but no other findings of acute cholecystitis, no biliary dilation; 11/07/2020 CT the abdomen pelvis with contrast with an ill-defined hypervascular area in the head of the pancreas concerning for potential pancreatic neoplasm, further eval recommended with abdominal MRI, nonocclusive thrombosis in the proximal portal vein, cholelithiasis without evidence of acute cholecystitis and aortic atherosclerosis in addition to left main and three-vessel coronary artery disease as well as a small penetrating ulcer of the anterior aspect of the distal descending thoracic aorta at the level of the aortic hiatus, colonic diverticulosis without evidence of acute diverticulitis; 11/08/2020 MRCP with and without contrast with no definite pancreatic mass confidently identified, there were tiny cystic lesions in the head of the pancreas which likely reflected areas of sidebranch ectasia and were multiple small sidebranch IPMN's, multiple cysts in the kidneys, nonocclusive portal vein thrombosis and infrarenal abdominal aortic aneurysm; elevated CA 19.9 at 113, CEA and normal (did have repeat CT of the chest with contrast performed on 08/30/2020 for unintentional weight loss and showed no evidence of recurrent or metastatic disease)  GI history: 04/01/2019 colonoscopy  Dr. [name]: - Four diminutive polyps in the rectum, in the descending colon and in the ascending colon, removed with a cold snare. Resected and retrieved. - The examination was otherwise normal on direct and retroflexion views. - Personal history of colonic polyps. ATTENUATED COLONIC POLYPOSIS Recommendations: Repeat  colonoscopy 03/31/2021  Past Medical History:  Diagnosis Date  . AAA (abdominal aortic aneurysm) (HCC)    noted CT 11/2012 size 3.3 x 3.2 cm  . Alcohol abuse   . Barretts esophagus    bx distal esophagus 2011 neg   . Bladder outlet obstruction 04/02/2018  . Cataract   . Cataract   . CHF (congestive heart failure) ([place])   . Cholelithiasis   . Chronic osteomyelitis involving ankle and foot, right ([place]) 07/13/2018  . 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   2006 - 3 adenomas largest 12 mm  2008 - no polyps 2013-  2 mm cecal polyp, adenoma, routine repeat colonoscopy 02/2017 approx 05/14/2017 18 polyps max 12 mm ALL adenomas recall 2019 -  [name], MD, [facility]     . Diverticulosis of colon   . DJD (degenerative joint disease)    left hip  . Full dentures    upper and lower  . GERD (gastroesophageal reflux disease)    diet controlled   . Gout    feet  . Hiatal hernia    2 cm noted on upper endos 2011   . Hx of adenomatous colonic polyps   . Hyperlipidemia   . Hypertension   . Internal hemorrhoid   . 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 Myocardial infarction [facility]) 2004   hx of, 2 stents 2004. pt was on Plavix x 3 years then transitioned to ASA, no problems since  . Non-small cell carcinoma of right lung, stage 1 ([place]) 01/30/2016  . Renal failure, acute (HCC)    hx of 2/2 medication, resolved  . Tobacco abuse    smoker .5 ppd x 55 yrs  . UTI (lower urinary tract infection)     Past Surgical History:  Procedure Laterality Date  . cataract surgery Bilateral 2018   removal of cataracts  . COLONOSCOPY     hx polyps - [name] 02/2012  . CORONARY ANGIOPLASTY WITH STENT PLACEMENT    . flexible cystourethroscopy  05/24/06  . INGUINAL HERNIA REPAIR  2005  . LUNG LOBECTOMY Right 11/07   RUL  . MULTIPLE TOOTH EXTRACTIONS    . UPPER GASTROINTESTINAL ENDOSCOPY      Family History  Problem Relation Age of Onset  . Diabetes  Mother   . Hypertension Sister   . Heart disease Sister   . Diabetes Sister   . Arthritis Brother   . Gout Brother   . Colon cancer Neg Hx   . Esophageal cancer Neg Hx   . Rectal cancer Neg Hx   . Stomach cancer Neg Hx   . Colon polyps Neg Hx      Social History   Tobacco Use  . Smoking status: Current Every Day Smoker    Packs/day: 0.25    Years: 55.00    Pack years: 13.75    Types: Cigarettes  . Smokeless tobacco: Never Used  Vaping Use  . Vaping Use: Never used  Substance Use Topics  . Alcohol use: Yes    Alcohol/week: 0.0 standard drinks    Comment: drinking 1-2 pts gin/week, (mixed with tap water not tonic)  . Drug use:  No    Prior to Admission medications   Medication Sig Start Date End Date Taking? Authorizing Provider  allopurinol (ZYLOPRIM) 100 MG tablet Take 1 tablet (100 mg total) by mouth at bedtime. 11/08/19  Yes Axel Filler, MD  amLODipine (NORVASC) 10 MG tablet Take 1 tablet (10 mg total) by mouth daily. 10/31/20  Yes Axel Filler, MD  aspirin EC 81 MG tablet Take 81 mg by mouth at bedtime.    Yes [provider]  HYDROcodone-acetaminophen (NORCO) 7.5-325 MG tablet Take 1 tablet by mouth 2 (two) times daily as needed for moderate pain. Patient taking differently: Take 1 tablet by mouth in the morning and at bedtime. 08/14/20  Yes Axel Filler, MD  metoprolol succinate (TOPROL-XL) 100 MG 24 hr tablet Take 1 tablet (100 mg total) by mouth daily. Take with or immediately following a meal. Patient taking differently: Take 100 mg by mouth at bedtime. 10/31/20  Yes Axel Filler, MD  Multiple Vitamin (MULTIVITAMIN WITH MINERALS) TABS tablet Take 1 tablet by mouth daily.   Yes [provider]  rosuvastatin (CRESTOR) 20 MG tablet TAKE 1 TABLET EVERY DAY Patient taking differently: Take 20 mg by mouth at bedtime. 08/28/20  Yes Axel Filler, MD  tamsulosin (FLOMAX) 0.4 MG CAPS capsule TAKE 2 CAPSULES (0.8  MG TOTAL) BY MOUTH EVERY EVENING. Patient taking differently: Take 0.8 mg by mouth at bedtime. 08/28/20  Yes Axel Filler, MD  albuterol (PROVENTIL HFA;VENTOLIN HFA) 108 (90 Base) MCG/ACT inhaler Inhale 2 puffs into the lungs every 6 (six) hours as needed for wheezing or shortness of breath. Patient not taking: No sig reported 06/02/17   Axel Filler, MD  docusate sodium (COLACE) 100 MG capsule Take 1 capsule (100 mg total) by mouth 2 (two) times daily as needed for mild constipation. Patient not taking: No sig reported 07/25/18   Street, [place], PA-C  OVER THE COUNTER MEDICATION Take 1 tablet by mouth 2 (two) times daily as needed (pain). Pain medication that dissolves like alka-seltzer - from [facility] Patient not taking: Reported on 11/06/2020    [provider]  polyethylene glycol (MIRALAX / GLYCOLAX) packet Take 17 g by mouth daily. Take daily until you achieve soft daily bowel movements, then titrate dosing to continue having daily soft bowel movements Patient not taking: No sig reported 07/25/18   Street, [place], [place]    Current Facility-Administered Medications  Medication Dose Route Frequency Provider Last Rate Last Admin  . 0.9 %  sodium chloride infusion   Intravenous PRN Aldine Contes, MD   Stopped at 11/07/20 2300  . acetaminophen (TYLENOL) tablet 650 mg  650 mg Oral Q6H PRN Jeralyn Bennett, MD   650 mg at 11/09/20 1207  . allopurinol (ZYLOPRIM) tablet 100 mg  100 mg Oral QHS Jeralyn Bennett, MD   100 mg at 11/09/20 2211  . ceFEPIme (MAXIPIME) 2 g in sodium chloride 0.9 % 100 mL IVPB  2 g Intravenous Q12H Tommy Medal, Lavell Islam, MD   Stopped at 11/10/20 1014  . Chlorhexidine Gluconate Cloth 2 % PADS 6 each  6 each Topical Q0600 Aldine Contes, MD   6 each at 11/10/20 0427  . feeding supplement (ENSURE ENLIVE / ENSURE PLUS) liquid 237 mL  237 mL Oral TID BM Jeralyn Bennett, MD   237 mL at 11/10/20 0948  . heparin ADULT infusion 100 units/mL (25000  units/242mL)  1,350 Units/hr Intravenous Continuous Carney, Gay Filler, RPH 13.5 mL/hr at 11/10/20 1159 1,350 Units/hr  at 11/10/20 1159  . HYDROcodone-acetaminophen (NORCO/VICODIN) 5-325 MG per tablet 1 tablet  1 tablet Oral BID PRN Jeralyn Bennett, MD   1 tablet at 11/08/20 1456  . metoprolol tartrate (LOPRESSOR) tablet 12.5 mg  12.5 mg Oral BID Jeralyn Bennett, MD   12.5 mg at 11/10/20 0939  . multivitamin with minerals tablet 1 tablet  1 tablet Oral Daily Jeralyn Bennett, MD   1 tablet at 11/10/20 [phone]  . ondansetron (ZOFRAN) injection 4 mg  4 mg Intravenous Q8H PRN Jeralyn Bennett, MD      . potassium PHOSPHATE 10 mmol in dextrose 5 % 250 mL infusion  10 mmol Intravenous Once Jeralyn Bennett, MD 42 mL/hr at 11/10/20 1159 Infusion Verify at 11/10/20 1159  . ramelteon (ROZEREM) tablet 8 mg  8 mg Oral QHS Sanjuan Dame, MD   8 mg at 11/09/20 2211  . rosuvastatin (CRESTOR) tablet 20 mg  20 mg Oral Daily Jeralyn Bennett, MD   20 mg at 11/10/20 0939  . sodium chloride flush (NS) 0.9 % injection 3 mL  3 mL Intravenous Q12H Jeralyn Bennett, MD   3 mL at 11/10/20 0948  . [START ON 11/11/2020] sulfamethoxazole-trimethoprim (BACTRIM DS) 800-160 MG per tablet 1 tablet  1 tablet Oral Q12H Tommy Medal, Lavell Islam, MD      . tamsulosin [facility]) capsule 0.8 mg  0.8 mg Oral QPM Jeralyn Bennett, MD   0.8 mg at 11/09/20 1720    Allergies as of 11/06/2020 - Review Complete 11/06/2020  Allergen Reaction Noted  . Candesartan cilexetil-hctz Other (See Comments)   . Hydrochlorothiazide Other (See Comments)   . Shellfish allergy Other (See Comments) 03/24/2013   Review of Systems:    Constitutional: No fever or chills Skin: No rash Cardiovascular: No chest pain Respiratory: No SOB  Gastrointestinal: See HPI and otherwise negative Genitourinary: No dysuria  Neurological: No headache, dizziness or syncope Musculoskeletal: No new muscle or joint pain Hematologic: No bleeding  Psychiatric: No history of  depression or anxiety    Physical Exam:  Vital signs in last 24 hours: Temp:  [97.7 F (36.5 C)-98.6 F (37 C)] 97.7 F (36.5 C) (05/27 0851) Pulse Rate:  [60-72] 62 (05/27 0851) Resp:  [18-20] 18 (05/27 0851) BP: (102-110)/(46-66) 108/66 (05/27 0851) SpO2:  [96 %-100 %] 97 % (05/27 0851) Weight:  [62.8 kg] 62.8 kg (05/27 0700) Last BM Date: 11/10/20 General:   Pleasant thin appearing AA male appears to be in NAD, Well developed, Well nourished, alert and cooperative Head:  Normocephalic and atraumatic. Eyes:   PEERL, EOMI. No icterus. Conjunctiva pink. Ears:  Normal auditory acuity. Neck:  Supple Throat: Oral cavity and pharynx without inflammation, swelling or lesion. Teeth in good condition. Lungs: Respirations even and unlabored. Lungs clear to auscultation bilaterally.   No wheezes, crackles, or rhonchi.  Heart: Normal S1, S2. No MRG. Regular rate and rhythm. No peripheral edema, cyanosis or pallor.  Abdomen:  Soft, nondistended, nontender. No rebound or guarding. Normal bowel sounds. No appreciable masses or hepatomegaly. Rectal:  Not performed.  Msk:  Symmetrical without gross deformities. Peripheral pulses intact.  Extremities:  Without edema, no deformity or joint abnormality. Neurologic:  Alert and  oriented x4;  grossly normal neurologically.  Skin:   Dry and intact without significant lesions or rashes. Psychiatric: Demonstrates good judgement and reason without abnormal affect or behaviors.   LAB RESULTS: Recent Labs    11/08/20 1533 11/09/20 0316 11/10/20 0342  WBC 6.6 7.5 5.4  HGB 9.9* 9.5* 9.2*  HCT 29.6* 28.5* 27.5*  PLT 131* 132* 150   BMET Recent Labs    11/08/20 0408 11/09/20 0316 11/10/20 0621  NA 137 136 138  K 4.2 3.7 4.0  CL 104 104 106  CO2 27 29 27   GLUCOSE 102* 94 93  BUN 16 13 11   CREATININE 1.20 1.12 1.09  CALCIUM 9.2 8.8* 8.7*   LFT Recent Labs    11/10/20 0621  PROT 5.1*  ALBUMIN 1.7*  AST 48*  ALT 38  ALKPHOS 55   BILITOT 0.5     Impression / Plan:   Impression: 1.  Sepsis secondary to Enterobacteriaceae bacteremia: Uncertain etiology, ID believe source is most likely urinary given pyuria with chronic retention versus due to gut translocation in the setting of possible cirrhosis 2.  Weight loss: 60 pounds in the last 2 years per patient 3.  Nonobstructive portal vein thrombosis 4.  HFpEF 5.  Triple-vessel CAD 6.  NSTEMI type II 7.  Anemia of chronic disease 8.  Likely complicated cystitis 9.  History of polyposis: Repeat colonoscopy due in October of this year  Plan: 1.  Could consider endoscopic work-up for unexplained weight loss with EGD and colonoscopy.  Will discuss further with Dr. Loletha Carrow.  Did discuss with the patient and he agreed to proceed if necessary. 2.  Continue supportive measures 3.  Please await any further recommendations from Dr. Loletha Carrow later today.  Thank you for your kind consultation, we will continue to follow.  Lavone Nian [facility]  11/10/2020, 12:57 PM  Years of progressive weight loss of unclear cause.  He has longstanding alcohol abuse, which may affect his appetite and therefore oral intake leading to weight loss, but there is been clinical concern for an occult malignancy. Chronic pancreatitis with pancreatic calcification seen on CT scan in the setting of longstanding alcohol abuse.  He is a poor historian, so I have a difficult time determining if he has had diarrhea, but it sounds like "sometimes".  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  Nonocclusive portal vein thrombosis of unclear cause.  Vascular surgery was consulted for that and his aortic aneurysm and they recommended anticoagulation.  I will leave this to the discretion of the primary care team, but chronic portal vein thrombosis is typically not treated with long-term anticoagulation.  Despite his history of alcohol abuse, imaging studies do not suggest cirrhosis.  Abnormal imaging of  the pancreatic head, initially suggestion of mass on CT, and MRI is limited by motion artifact.  It suggest there are more like microcystic changes which could be IPMN or chronic pancreatitis.  There is not definite malignancy there.  Also, it would be very unusual for pancreatic malignancy to cause weight loss over such a long period of time.  In due course, we can have his imaging reviewed by our advanced endoscopist to see if eventual endoscopic ultrasound would evaluate this better.  Bacteremia of unclear cause, though perhaps urinary source.  Seems less likely translocation from a GI source.  History of attenuated FAP according to Dr. [name].   In short, the question to [place] appears to be management of the pancreatic head lesion with elevated CA 19-9 of 113 as well as whether this patient may be harboring a malignancy to account for his weight loss.  I recommend inpatient upper endoscopy and colonoscopy to search for source of weight loss.  He was agreeable after discussion of procedure and risks.  The benefits and risks of the planned procedure were described  in detail with the patient or (when appropriate) their health care proxy.  Risks were outlined as including, but not limited to, bleeding, infection, perforation, adverse medication reaction leading to cardiac or pulmonary decompensation, pancreatitis (if ERCP).  The limitation of incomplete mucosal visualization was also discussed.  No guarantees or warranties were given.  We will plan this for the day after tomorrow, so he can be on a clear liquid diet all day tomorrow and do a sufficient bowel preparation for good quality colonoscopy.  His heparin will need to be held several days prior to the procedure, and again I will leave it to the discretion of the primary medical team about reinstituting it or whether or not he should be on it long-term.  Outpatient attention to the pancreatic head findings.  If nothing found on EGD and  colonoscopy, then we will most likely start empiric pancreatic enzyme supplements.  He also needs complete alcohol abstinence.

## 2020-11-10 NOTE — Progress Notes (Signed)
Subjective:   This morning, Mr. Matsuura is eating breakfast upon entering the room. States he's having bowel movements, 1-2/day, dark brown in color. He is still having bowel incontinence with the urge to urinate, endorses this is 2/2 catheter but also reports this happens sometimes at home. He reports he's eating well, denies nausea and vomiting. Says he is always cold although denies fevers.   Objective:  Vital signs in last 24 hours: Vitals:   11/09/20 2341 11/10/20 0428 11/10/20 0700 11/10/20 0851  BP: 105/61 103/61  108/66  Pulse: 60 63  62  Resp: 20 18  18   Temp: 98.1 F (36.7 C) 98.4 F (36.9 C)  97.7 F (36.5 C)  TempSrc: Oral Oral  Oral  SpO2: 100% 100%  97%  Weight:   62.8 kg   Height:       General: Patient is thin and appears chronically ill, eating breakfast comfortably. Eyes: Mild proptosis, No scleral icterus.  HENT: MMM. Poor dentition. Respiratory: Lungs are CTA, bilaterally. No wheezes, rales, or rhonchi. No increased work of breathing on room air.  Cardiovascular: Rate is slightly bradycardic. Rhythm is irregularly irregular. There is a 2/6 systolic murmur over the right upper chest. No other m/r/g.  Neurological: Alert and oriented. No cranial nerve deficits noted.  MSK: Patient has severe, diffuse cachexia and generalized weakness. Abdominal: Soft, non-tender and not distended. No guarding or rebound. Abdominal aorta is palpable. Bowel sounds are intact throughout.  Skin: There is an indented, sub-centimeter, black shiny lesion over patient's mid-thoracic back. No other lesions noted.  Psych: Pleasant and cooperative.  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 Patient does seem to have some underlying memory impairment, so it is unclear how long his appetite has truly been down. Acute bacteremia is very likely contributing, although chronic weight loss is concerning for malignancy (especially with hx of lung cancer s/p resection and treatment) vs. Underlying hepatobiliary pathology.  - See below for full details  - Continue strict I&O  - Check daily weights   # Sepsis Secondary to Enterobacterales Bacteremia # Likely Complicated Cystitis in the setting of BPH w/ Chronic Urinary Retention  Multiple blood cultures returned positive for Enterobacter Sakazakii, which is pan-sensitive, including to cefepime he is currently being treated with. ID are on board and believe his infection is likely due to urinary source (in setting of pyuria and chronic urinary retention) vs gut translocation without deep infection. He remains afebrile, HDS, with normal WBC on day 4 of cefepime. Repeat urine cultures were negative, although were drawn while on cefepime.  - ID signed off, appreciate their recommendations  - Continue foley catheter  - Continue home tamsulosin  - Would benefit from urology follow up outpatient - Check urine sensitivities  - Will switch to bactrim DS BID tomorrow if he continues to do well (Day 4/7)  - Continue to trend CBC's   # Possible Pancreatic Tumor(s)  Lipase was elevated to 65 on admission. CT Abdomen Pelvis showed an ill-defined hypovascular area over the head and uncinate process of the pancreas with some calcification, concerning for malignancy and possible chronic pancreatitis. Follow-up MRI / MRCP was significantly motion-degraded, although did  not show any discrete pancreatic neoplasm. There were multiple tiny cystic areas (59mm max) consistent with side branch ectasia and/or multiple side branch IPMN's. CA 19-9 was slightly elevated at 113. His anorexia and weight loss remain concerning for a pancreatic  mass. He does not have any abdominal pain, tenderness, or pancreatic ductal dilation to suggest pancreatitis.  - Oncology was consulted and will see patient this weekend, appreciate their assistance  - Continue to trend CMP for now   # Non-Obstructive Portal Vein Thrombosis  # Multiple Aortic Aneurysms # Possible Aortic Ulcer  Aortic aneurysms are stable and portal vein thrombosis is most likely provoked - may be secondary to underlying malignancy vs. Possible cirrhosis.  - Continue heparin for now  - Plan to switch to DOAC once procedures are done  - Continue to trend daily CBC's  - Will need outpatient surveillance of aneurysms  # Severe Hepatic Steatosis, Concerning for Underlying Cirrhosis in Setting of Hx AUD # Cholelithiasis  # Mixed Hyperbilirubinemia, Resolved  # Elevated AST, Persistent  RUQ ultrasound showed heterogenous increased hepatic parenchymal echogenicity consistent with hepatic steatosis as well as gallstones, sludge and trace pericholecystic fluid, although no biliary ductal dilation or other concerning features to suggest cholecystitis. CT abdomen showed a partially calcified 1.5cm gallstone at the neck of the gallbladder (which was nearly compressed) although neither CT nor MRI showed evidence of cholecystitis or liver lesion. He does has very significant alcohol use history and had recently stopped drinking, likely contributing to both. Fibrosis score is F3-F4 and it is highly possible he may have underlying cirrhosis which may be contributing to weight loss and portal vein thrombus. - GI consulted, appreciate their recommendations  - Continue to trend CMP for now  - Discontinued CIWA given no concern for active withdrawal  # New 2nd Degree AV Block, Type I w/ Bradycardia  # LAFB # HFpEF Patient has mild LVH and G1DD but no obvious structural heart abnormalities, although ECHO had poor windows. EKG's have shown both Mobitz Type I and 1st degree AV block with old LAFB  and telemetry consistent with mild asymptomatic bradycardia with PVC's. Continues to appear dry if anything. - Continue Metoprolol to 12.5mg  BID for now given improvement in AV block although will D/C if rates slow  - Continuous telemetry monitoring  - Keep K 4-5 and Mag >2  # Triple Vessel CAD # NSTEMI Type II  Troponins were slightly elevated although flat on arrival. May be in setting of hypotension and active infection. Patient denies any CP although was noted to have significant three vessel including left main calcifications on his CT scan. Severe DOE may be related to this although much more likely due to deconditioning. Last stress test was in 2016.   - Would likely benefit from outpatient cardiology (vs. 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 - Continue to encourage PO intake  - Continue to monitor daily renal function and electrolytes   # Acute Hypoproliferative Macrocytic Anemia # Anemia of Chronic Disease  Hemoglobin is slowly down-trending from 11.7 on admission and from baseline ~14 in the past, 13.2 01/2020. Acute change from admission is likely dilutional  without known source of bleeding. He now has borderline pancytopenia, which may be due to liver fibrosis, underlying bone marrow hypoproliferation, or more acute blood loss (unknown source).  - Continue to trend daily hemoglobin  - Monitor closely for bleeding on IV heparin   # Hx of HTN # Intermittent Hypotension  Pressures are starting to improve, likely due to reduced PO intake although arrhythmia may be contributing. Patient takes Toprol-XL 100mg  and  amlodipine 10mg  daily at home which also may have contributed to orthostasis and weakness recently.  - Continue Lopressor 12.5mg  BID for now  - Continue to hold home medications  - Close monitoring   # Chronic Pain  Patient takes Norco PRN at home for pain.  - Norco 5-325mg  twice daily PRN for severe pain   # Gout Patient notes he avoids seafoods and certain foods due to gout flares. - Continue home allopurinol   Code Status: Full Code  Diet: Regular Fluids: None  DVT PPx: Therapeutic heparin   Prior to Admission Living Arrangement: Home alone  Anticipated Discharge Location: SNF Barriers to Discharge: Continued workup   Jeralyn Bennett, MD 11/10/2020, 5:21 PM Pager: [phone] After 5pm on weekdays and 1pm on weekends: On Call pager [phone]

## 2020-11-10 NOTE — Progress Notes (Signed)
ANTICOAGULATION CONSULT NOTE - Follow Up Consult  Pharmacy Consult for heparin Indication: portal vein thrombosis  Labs: Recent Labs    11/07/20 0618 11/08/20 0408 11/08/20 1533 11/08/20 1533 11/09/20 0316 11/09/20 1240 11/09/20 1901 11/10/20 0342  HGB 11.4* 9.9* 9.9*  --  9.5*  --   --  9.2*  HCT 34.2* 29.1* 29.6*  --  28.5*  --   --  27.5*  PLT 135* 131* 131*  --  132*  --   --  150  APTT 33  --   --   --   --   --   --   --   LABPROT 13.8  --   --   --   --   --   --   --   INR 1.1  --   --   --   --   --   --   --   HEPARINUNFRC  --   --   --    < > 0.12* 0.37 0.25* 0.47  CREATININE 1.51* 1.20  --   --  1.12  --   --   --    < > = values in this interval not displayed.    Assessment/Plan:  75yo male therapeutic on heparin after rate change. Will continue gtt at current rate of 1350 units/hr and confirm stable with additional level.   Wynona Neat, PharmD, BCPS  11/10/2020,5:10 AM

## 2020-11-10 NOTE — Consult Note (Addendum)
[place]  Telephone:([phone] Fax:([phone]) [phone]   MEDICAL ONCOLOGY - INITIAL CONSULTATION  Referral MD: Dr. 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 Roxan Hockey, tumor size was 8 cm with patient refused adjuvant chemotherapy-previously followed by Dr. Julien Nordmann for observation only and was discharged from the practice in November 2018.  The patient presented to the hospital with generalized weakness.  He was reportedly experiencing significant fatigue and shortness of breath with minimal exertion for about 1 week prior to admission.  He also experienced some lightheadedness only when standing and also reported constipation.  His appetite has been decreased and he has had weight loss over the past 6 months as well.  On admission, his hemoglobin was 11.7, potassium 3.2, BUN 29, creatinine 1.82, albumin 2.2, AST 66, ALT 46, T bili 2.3.  The patient has Enterobacter bacteremia and is on IV antibiotics.  He had an ultrasound of the abdomen performed on admission which showed heterogeneous increased hepatic parenchymal echogenicity typical for steatosis, stones and sludge within the gallbladder, no acute cholecystitis.  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  The patient has been placed on heparin for the portal vein thrombosis.  He had an MRCP performed 11/08/2020 which showed no definite pancreas mass, tiny cystic lesions in the head of the pancreas which likely reflect areas of side branch ectasia and/or multiple small side branch IPMN's, nonocclusive portal vein thrombus again noted.  Blood was sent for tumor markers on 11/08/2020 and his CEA was normal at 3.0 and CA 19.9 was elevated at 113.  Of note, the patient had a CT of the chest with contrast performed on 08/30/2020 due to unintended weight loss and has history of lung cancer which showed no evidence of recurrent or metastatic disease, hepatic steatosis, cholelithiasis, left renal stone, and large pulmonic trunk indicative of pulmonary arterial hypertension.  [name] was seen in his hospital room.  No family at the bedside.  The patient reports that he has been seeing his primary care provider on a regular basis due to generalized weakness and fatigue.  He also has had a decrease in appetite and he would estimate a 60 pound weight loss over the past 3 years.  He reports shortness of breath with minimal exertion.  No shortness of breath at rest reported.  He currently denies headaches, dizziness, chest pain, abdominal pain, nausea, vomiting, diarrhea.  He reports previous constipation which is now resolved.  No bleeding reported.  He has not noticed any jaundice.  The patient is single.  He lives alone.  He reports that he has 3 children who live in [place].  The patient reports that he smokes three quarters of a pack of cigarettes per day x55 years.  He reports that he drank 1 to 2  pints or more of liquor per week.  He states that he quit drinking alcohol about 1 month ago.  Medical oncology was asked to the patient to make recommendations regarding the abnormal CT scan findings and elevated CA 19.9.   Past Medical History:  Diagnosis Date   . AAA (abdominal aortic aneurysm) (HCC)    noted CT 11/2012 size 3.3 x 3.2 cm  . Alcohol abuse   . Barretts esophagus    bx distal esophagus 2011 neg   . Bladder outlet obstruction 04/02/2018  . Cataract   . Cataract   . CHF (congestive heart failure) ([place])   . Cholelithiasis   . Chronic osteomyelitis involving ankle and foot, right ([place]) 07/13/2018  . 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   2006 - 3 adenomas largest 12 mm  2008 - no polyps 2013-  2 mm cecal polyp, adenoma, routine repeat colonoscopy 02/2017 approx 05/14/2017 18 polyps max 12 mm ALL adenomas recall 2019 -  [name], MD, [facility]     . Diverticulosis of colon   . DJD (degenerative joint disease)    left hip  . Full dentures    upper and lower  . GERD (gastroesophageal reflux disease)    diet controlled   . Gout    feet  . Hiatal hernia    2 cm noted on upper endos 2011   . Hx of adenomatous colonic polyps   . Hyperlipidemia   . Hypertension   . Internal hemorrhoid   . 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 Myocardial infarction [facility]) 2004   hx of, 2 stents 2004. pt was on Plavix x 3 years then transitioned to ASA, no problems since  . Non-small cell carcinoma of right lung, stage 1 ([place]) 01/30/2016  . Renal failure, acute (HCC)    hx of 2/2 medication, resolved  . Tobacco abuse    smoker .5 ppd x 55 yrs  . UTI (lower urinary tract infection)   :  Past Surgical History:  Procedure Laterality Date  . cataract surgery Bilateral 2018   removal of cataracts  . COLONOSCOPY     hx polyps - [name] 02/2012  . CORONARY ANGIOPLASTY WITH STENT PLACEMENT    . flexible cystourethroscopy  05/24/06  . INGUINAL HERNIA REPAIR  2005  . LUNG LOBECTOMY Right 11/07   RUL  . MULTIPLE TOOTH EXTRACTIONS    . UPPER GASTROINTESTINAL ENDOSCOPY    :  Current Facility-Administered Medications  Medication Dose Route Frequency Provider Last Rate Last Admin  . 0.9 %  sodium  chloride infusion   Intravenous PRN Aldine Contes, MD   Stopped at 11/07/20 2300  . acetaminophen (TYLENOL) tablet 650 mg  650 mg Oral Q6H PRN Jeralyn Bennett, MD   650 mg at 11/09/20 1207  . allopurinol (ZYLOPRIM) tablet 100 mg  100 mg Oral QHS Jeralyn Bennett, MD   100 mg at 11/09/20 2211  . ceFEPIme (MAXIPIME) 2 g in sodium chloride 0.9 % 100 mL IVPB  2 g Intravenous Q12H Tommy Medal, Lavell Islam, MD   Stopped at 11/10/20 1014  . Chlorhexidine Gluconate Cloth 2 % PADS 6 each  6 each Topical Q0600 Aldine Contes, MD   6 each at 11/10/20 0427  . feeding supplement (ENSURE ENLIVE / ENSURE PLUS) liquid 237 mL  237 mL Oral TID BM Jeralyn Bennett, MD   237 mL at 11/10/20 0948  . heparin ADULT infusion 100 units/mL (25000 units/269mL)  1,350 Units/hr Intravenous Continuous Pat Patrick, RPH 13.5 mL/hr at 11/10/20 1159 1,350 Units/hr at 11/10/20 1159  . HYDROcodone-acetaminophen (NORCO/VICODIN) 5-325 MG per tablet 1 tablet  1 tablet Oral BID PRN Jeralyn Bennett, MD   1 tablet at 11/08/20 1456  . metoprolol tartrate (LOPRESSOR) tablet 12.5 mg  12.5 mg Oral BID Jeralyn Bennett, MD   12.5 mg at 11/10/20 0939  . multivitamin with minerals tablet 1 tablet  1 tablet Oral Daily Jeralyn Bennett, MD   1 tablet at 11/10/20 [id number]  . ondansetron (ZOFRAN) injection 4 mg  4 mg Intravenous Q8H PRN Jeralyn Bennett, MD      . potassium PHOSPHATE 10 mmol in dextrose 5 % 250 mL infusion  10 mmol Intravenous Once Jeralyn Bennett, MD 42 mL/hr at 11/10/20 1159 Infusion Verify at 11/10/20 1159  . ramelteon (ROZEREM) tablet 8 mg  8 mg Oral QHS Sanjuan Dame, MD   8 mg at 11/09/20 2211  . rosuvastatin (CRESTOR) tablet 20 mg  20 mg Oral Daily Jeralyn Bennett, MD   20 mg at 11/10/20 0939  . sodium chloride flush (NS) 0.9 % injection 3 mL  3 mL Intravenous Q12H Jeralyn Bennett, MD   3 mL at 11/10/20 0948  . [START ON 11/11/2020] sulfamethoxazole-trimethoprim (BACTRIM DS) 800-160 MG per tablet 1 tablet  1 tablet  Oral Q12H Tommy Medal, Lavell Islam, MD      . tamsulosin [facility]) capsule 0.8 mg  0.8 mg Oral QPM Jeralyn Bennett, MD   0.8 mg at 11/09/20 1720     Allergies  Allergen Reactions  . Candesartan Cilexetil-Hctz Other (See Comments)    Acute renal failure  . Hydrochlorothiazide Other (See Comments)    Acute renal failure  . Shellfish Allergy Other (See Comments)    Gout occurs  :  Family History  Problem Relation Age of Onset  . Diabetes Mother   . Hypertension Sister   . Heart disease Sister   . Diabetes Sister   . Arthritis Brother   . Gout Brother   . Colon cancer Neg Hx   . Esophageal cancer Neg Hx   . Rectal cancer Neg Hx   . Stomach cancer Neg Hx   . Colon polyps Neg Hx   :  Social History   Socioeconomic History  . Marital status: Single    Spouse name: Not on file  . Number of children: 0  . Years of education: Not on file  . Highest education level: Not on file  Occupational History  . Not on file  Tobacco Use  . Smoking status: Current Every Day Smoker    Packs/day: 0.25    Years: 55.00    Pack years: 13.75    Types: Cigarettes  . Smokeless tobacco: Never Used  Vaping Use  . Vaping Use: Never used  Substance and Sexual Activity  . Alcohol use: Yes    Alcohol/week: 0.0 standard drinks    Comment: drinking 1-2 pts gin/week, (mixed with tap water not tonic)  . Drug use: No  . Sexual activity: Not on file  Other Topics Concern  . Not on file  Social History Narrative  . Not on file   Social Determinants of Health   Financial Resource Strain: Not on file  Food Insecurity: Not on file  Transportation Needs: Not on file  Physical Activity: Not on file  Stress: Not on file  Social Connections: Not on file  Intimate Partner Violence: Not on file  :  Review of Systems:  A comprehensive 14 point review of systems was negative except as noted in the HPI.  Exam: Patient Vitals for the past 24 hrs:  BP Temp Temp src Pulse Resp SpO2 Weight  11/10/20 0851  108/66 97.7 F (36.5 C) Oral 62 18 97 % --  11/10/20 0700 -- -- -- -- -- -- 62.8 kg  11/10/20 0428 103/61 98.4 F (36.9 C) Oral 63 18 100 % --  11/09/20 2341 105/61 98.1 F (36.7 C) Oral 60 20 100 % --  11/09/20 2036 (!) 102/46 98 F (36.7 C) Oral 72 18 99 % --  11/09/20 1642 110/66 98.6 F (37 C) Oral 68 18 96 % --    General: Awake and alert, no distress, cachectic Eyes:  no scleral icterus.   ENT: Edentulous, no thrush or mucositis. Lymphatics:  Negative cervical, supraclavicular or axillary adenopathy.   Respiratory: lungs were clear bilaterally without wheezing or crackles.   Cardiovascular:  Regular rate and rhythm, S1/S2, without murmur, rub or gallop.  There was no pedal edema.   GI:  abdomen was soft, flat, nontender, nondistended, without organomegaly.   Musculoskeletal: Strength symmetrical in the upper and lower extremities..   Skin exam was without echymosis, petichae.   Neuro exam was nonfocal. Patient was alert and oriented.  Attention was good.   Language was appropriate.  Mood was normal without depression.  Speech was not pressured.  Thought content was not tangential.     Lab Results  Component Value Date   WBC 5.4 11/10/2020   HGB 9.2 (L) 11/10/2020   HCT 27.5 (L) 11/10/2020   PLT 150 11/10/2020   GLUCOSE 93 11/10/2020   CHOL 112 07/13/2018   TRIG 51 07/13/2018   HDL 51 07/13/2018   LDLCALC 51 07/13/2018   ALT 38 11/10/2020   AST 48 (H) 11/10/2020   NA 138 11/10/2020   K 4.0 11/10/2020   CL 106 11/10/2020   CREATININE 1.09 11/10/2020   BUN 11 11/10/2020   CO2 27 11/10/2020    CT ABDOMEN PELVIS W CONTRAST  Result Date: 11/07/2020 CLINICAL DATA:  75 year old male with history of anemia and unintended weight loss. EXAM: CT ABDOMEN AND PELVIS WITH CONTRAST TECHNIQUE: Multidetector CT imaging of the abdomen and pelvis was performed using the standard protocol following bolus administration of intravenous contrast. CONTRAST:  145mL OMNIPAQUE IOHEXOL 300  MG/ML  SOLN COMPARISON:  04/02/2018.  CT the abdomen and pelvis FINDINGS: Lower chest: Atherosclerotic calcifications in the thoracic aorta as well as the left main, left anterior descending, left circumflex and right coronary arteries. Hepatobiliary: No suspicious cystic or solid hepatic lesions. No intra or extrahepatic biliary ductal dilatation. Partially calcified gallstone measuring 1.5 cm in diameter in the neck of the gallbladder which is nearly decompressed. No pericholecystic fluid or inflammatory changes. Pancreas: Ill-defined hypovascular area in the head and uncinate process of the pancreas (axial image 40 of series 3), concerning for potential pancreatic mass. There are some calcifications in this region as well. Body and tail of the pancreas are otherwise normal in appearance. No pancreatic ductal dilatation. No peripancreatic fluid collections or inflammatory changes. Spleen: Unremarkable. Adrenals/Urinary Tract: Multiple low-attenuation lesions in the kidneys bilaterally, compatible with simple cysts, largest of which is in the upper pole of the right kidney measuring 4.6 cm in diameter. Other subcentimeter low-attenuation lesions in both kidneys are too small to definitively characterize, but are favored to represent tiny cysts. Bilateral adrenal glands are normal in appearance. No hydroureteronephrosis. Urinary bladder is nearly decompressed  around an indwelling Foley balloon catheter, but is otherwise unremarkable in appearance. Stomach/Bowel: The appearance of the stomach is normal. There is no pathologic dilatation of small bowel or colon. Numerous colonic diverticulae are noted, without surrounding inflammatory changes to suggest an acute diverticulitis at this time. Normal appendix. Vascular/Lymphatic: Aortic atherosclerosis with fusiform aneurysmal dilatation of the infrarenal abdominal aorta which measures up to 5.3 x 5.2 cm in diameter. There is also aneurysmal dilatation of the right  common iliac artery which measures 2.0 cm in diameter. Small penetrating ulcer noted at the level of the aortic hiatus (axial image 17 of series 3). Small filling defect in the proximal portal vein (axial image 32 of series 3) compatible with thrombus. No lymphadenopathy noted in the abdomen or pelvis. Reproductive: Prostate gland and seminal vesicles are unremarkable in appearance. Other: No significant volume of ascites.  No pneumoperitoneum. Musculoskeletal: There are no aggressive appearing lytic or blastic lesions noted in the visualized portions of the skeleton. IMPRESSION: 1. Ill-defined hypovascular area in the head of the pancreas concerning for potential pancreatic neoplasm. Further evaluation with nonemergent abdominal MRI with and without IV gadolinium with MRCP is recommended in the near future to better evaluate this finding. 2. 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 3. Cholelithiasis without evidence of acute cholecystitis at this time. 4. Aortic atherosclerosis, in addition to left main and 3 vessel coronary artery disease. In addition, there is a small penetrating ulcer of the anterior aspect of the distal descending thoracic aorta at the level of the aortic hiatus, fusiform aneurysmal dilatation of the infrarenal abdominal aorta which measures up to 5.3 x 5.2 cm in diameter, and aneurysmal dilatation of the right common iliac artery which measures 2.0 cm in diameter. Recommend follow-up CT/MR every 6 months and vascular consultation. This recommendation follows ACR consensus guidelines: White Paper of the ACR Incidental Findings Committee II on Vascular Findings. J Am Coll Radiol 2013; 10:789-794. 5. Colonic diverticulosis without evidence of acute diverticulitis at this time. 6. Additional incidental findings, as above. Electronically Signed   By: Vinnie Langton M.D.   On: 11/07/2020 20:18   MR 3D Recon At Scanner  Result Date: 11/08/2020 CLINICAL DATA:  75 year old male with  history of possible pancreatic head mass noted on prior CT examination. Follow-up study. EXAM: MRI ABDOMEN WITHOUT AND WITH CONTRAST (INCLUDING MRCP) TECHNIQUE: Multiplanar multisequence MR imaging of the abdomen was performed both before and after the administration of intravenous contrast. Heavily T2-weighted images of the biliary and pancreatic ducts were obtained, and three-dimensional MRCP images were rendered by post processing. CONTRAST:  43mL GADAVIST GADOBUTROL 1 MMOL/ML IV SOLN COMPARISON:  No prior abdominal MRI. CT the abdomen and pelvis 11/07/2020. FINDINGS: Comment: Today's study is limited by large amount of patient motion artifact. Lower chest: Unremarkable. Hepatobiliary: No discrete cystic or solid hepatic lesions are confidently identified on today's motion limited examination. No intra or extrahepatic biliary ductal dilatation noted on MRCP images. Common bile duct measures 3-4 mm in the porta hepatis. No filling defects in the common bile duct to suggest choledocholithiasis. Filling defects are present in the gallbladder, compatible with cholelithiasis. Gallbladder is not distended. No gallbladder wall thickening or pericholecystic fluid or inflammatory changes. Pancreas: No discrete pancreatic mass is confidently identified in the head of the pancreas or uncinate process. Throughout this region there are multiple tiny cystic areas largest of which measure only 6 mm and what appears to be side branch ectasia, without a well-defined larger cystic mass. No pancreatic ductal  dilatation noted on MRCP images. No peripancreatic fluid collections or inflammatory changes. Spleen:  Unremarkable. Adrenals/Urinary Tract: Multiple T1 hypointense, T2 hyperintense, nonenhancing lesions are noted in both kidneys, compatible with simple cysts, largest of which is in the medial aspect of the upper pole of the right kidney measuring 5.1 x 4.6 cm. In addition, in the posterior aspect of the upper pole the left  kidney there is a 2.4 x 1.9 cm T1 hyperintense, T2 hypointense, nonenhancing lesion, compatible with a proteinaceous/hemorrhagic cyst. No hydroureteronephrosis in the visualized portions of the abdomen. Bilateral adrenal glands are normal in appearance. Stomach/Bowel: Visualized portions are unremarkable. Vascular/Lymphatic: No known infrarenal abdominal aortic aneurysm is only partially imaged, better demonstrated on yesterday's CT the abdomen and pelvis. Likewise, the filling defect in the proximal portal vein is evident on today's study, but better demonstrated on yesterday's CT examination. No lymphadenopathy noted in the abdomen. Other: No significant volume of ascites noted in the visualized portions of the peritoneal cavity. Musculoskeletal: No aggressive appearing osseous lesions are noted in the visualized portions of the skeleton. IMPRESSION: 1. No definite pancreatic mass confidently identified. 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 Recommend follow up pre and post contrast MRI/MRCP or pancreatic protocol CT in 2 years. This recommendation follows ACR consensus guidelines: Management of Incidental Pancreatic Cysts: A White Paper of the ACR Incidental Findings Committee. J Am Coll Radiol 2017;14:911-923. 2. 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 3. Nonocclusive portal vein thrombus again noted, but better demonstrated on yesterday's CT examination. 4. Infrarenal abdominal aortic aneurysm better demonstrated on yesterday's CT examination which measured up to 5.3 x 5.2 cm in diameter. Electronically Signed   By: Vinnie Langton M.D.   On: 11/08/2020 12:26   DG Chest Portable 1 View  Result Date: 11/06/2020 CLINICAL DATA:  Shortness of breath with exertion over the last 4 days. EXAM: PORTABLE CHEST 1  VIEW COMPARISON:  04/08/2008.  CT 08/29/2020. FINDINGS: Previous right upper lobectomy. Heart size is normal. Chronic aortic atherosclerotic calcification. Residual lung appears clear. No evidence of edema, infiltrate, collapse or effusion. IMPRESSION: Previous right upper lobectomy.  No active disease. Aortic atherosclerosis. Electronically Signed   By: Nelson Chimes M.D.   On: 11/06/2020 13:48   MR ABDOMEN MRCP W WO CONTAST  Result Date: 11/08/2020 CLINICAL DATA:  75 year old male with history of possible pancreatic head mass noted on prior CT examination. Follow-up study. EXAM: MRI ABDOMEN WITHOUT AND WITH CONTRAST (INCLUDING MRCP) TECHNIQUE: Multiplanar multisequence MR imaging of the abdomen was performed both before and after the administration of intravenous contrast. Heavily T2-weighted images of the biliary and pancreatic ducts were obtained, and three-dimensional MRCP images were rendered by post processing. CONTRAST:  49mL GADAVIST GADOBUTROL 1 MMOL/ML IV SOLN COMPARISON:  No prior abdominal MRI. CT the abdomen and pelvis 11/07/2020. FINDINGS: Comment: Today's study is limited by large amount of patient motion artifact. Lower chest: Unremarkable. Hepatobiliary: No discrete cystic or solid hepatic lesions are confidently identified on today's motion limited examination. No intra or extrahepatic biliary ductal dilatation noted on MRCP images. Common bile duct measures 3-4 mm in the porta hepatis. No filling defects in the common bile duct to suggest choledocholithiasis. Filling defects are present in the gallbladder, compatible with cholelithiasis. Gallbladder is not distended. No gallbladder wall thickening or pericholecystic fluid or inflammatory changes.  Pancreas: No discrete pancreatic mass is confidently identified in the head of the pancreas or uncinate process. Throughout this region there are multiple tiny cystic areas largest of which measure only 6 mm and what appears to be side branch ectasia,  without a well-defined larger cystic mass. No pancreatic ductal dilatation noted on MRCP images. No peripancreatic fluid collections or inflammatory changes. Spleen:  Unremarkable. Adrenals/Urinary Tract: Multiple T1 hypointense, T2 hyperintense, nonenhancing lesions are noted in both kidneys, compatible with simple cysts, largest of which is in the medial aspect of the upper pole of the right kidney measuring 5.1 x 4.6 cm. In addition, in the posterior aspect of the upper pole the left kidney there is a 2.4 x 1.9 cm T1 hyperintense, T2 hypointense, nonenhancing lesion, compatible with a proteinaceous/hemorrhagic cyst. No hydroureteronephrosis in the visualized portions of the abdomen. Bilateral adrenal glands are normal in appearance. Stomach/Bowel: Visualized portions are unremarkable. Vascular/Lymphatic: No known infrarenal abdominal aortic aneurysm is only partially imaged, better demonstrated on yesterday's CT the abdomen and pelvis. Likewise, the filling defect in the proximal portal vein is evident on today's study, but better demonstrated on yesterday's CT examination. No lymphadenopathy noted in the abdomen. Other: No significant volume of ascites noted in the visualized portions of the peritoneal cavity. Musculoskeletal: No aggressive appearing osseous lesions are noted in the visualized portions of the skeleton. IMPRESSION: 1. No definite pancreatic mass confidently identified. 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 Recommend follow up pre and post contrast MRI/MRCP or pancreatic protocol CT in 2 years. This recommendation follows ACR consensus guidelines: Management of Incidental Pancreatic Cysts: A White Paper of the ACR Incidental Findings Committee. J Am Coll Radiol 2017;14:911-923. 2. Multiple Bosniak class 1 and Bosniak class 2 cysts in the kidneys  bilaterally, as above. 3. Nonocclusive portal vein thrombus again noted, but better demonstrated on yesterday's CT examination. 4. Infrarenal abdominal aortic aneurysm better demonstrated on yesterday's CT examination which measured up to 5.3 x 5.2 cm in diameter. Electronically Signed   By: Vinnie Langton M.D.   On: 11/08/2020 12:26   ECHOCARDIOGRAM COMPLETE  Result Date: 11/06/2020    ECHOCARDIOGRAM REPORT   Patient Name:   [name] Date of Exam: 11/06/2020 Medical Rec #:  [id number]       Height:       72.0 in Accession #:    [id number]      Weight:       147.5 lb Date of Birth:  12/26/1945       BSA:          1.872 m Patient Age:    40 years        BP:           103/68 mmHg Patient Gender: M               HR:           78 bpm. Exam Location:  Inpatient Procedure: 2D Echo, Cardiac Doppler, Color Doppler and Intracardiac            Opacification Agent STAT ECHO Indications:    R01.1 Murmur  History:        Patient has prior history of Echocardiogram examinations, most                 recent 04/30/2006.  Sonographer:    [place]  Referring Phys: [id number] ERIC C KATZ  Sonographer Comments: Technically difficult study due to poor echo windows. IMPRESSIONS  1. Left ventricular ejection fraction, by estimation, is 55 to 60%. The left ventricle has normal function. Left ventricular endocardial border not optimally defined to evaluate regional wall motion. There is mild left ventricular hypertrophy. Left ventricular diastolic parameters are consistent with Grade I diastolic dysfunction (impaired relaxation).  2. Right ventricular systolic function was not well visualized. The right ventricular size is not well visualized. There is normal pulmonary artery systolic pressure. The estimated right ventricular systolic pressure is 44.8 mmHg.  3. The mitral valve is normal in structure. No evidence of mitral valve regurgitation. No evidence of mitral stenosis.  4. The aortic valve is tricuspid. Aortic valve  regurgitation is not visualized. No aortic stenosis is present.  5. The inferior vena cava is normal in size with greater than 50% respiratory variability, suggesting right atrial pressure of 3 mmHg.  6. Technically difficult study with poor acoustic windows. FINDINGS  Left Ventricle: Left ventricular ejection fraction, by estimation, is 55 to 60%. The left ventricle has normal function. Left ventricular endocardial border not optimally defined to evaluate regional wall motion. Definity contrast agent was given IV to delineate the left ventricular endocardial borders. The left ventricular internal cavity size was normal in size. There is mild left ventricular hypertrophy. Left ventricular diastolic parameters are consistent with Grade I diastolic dysfunction (impaired relaxation). Right Ventricle: The right ventricular size is not well visualized. Right vetricular wall thickness was not well visualized. Right ventricular systolic function was not well visualized. There is normal pulmonary artery systolic pressure. The tricuspid regurgitant velocity is 2.59 m/s, and with an assumed right atrial pressure of 3 mmHg, the estimated right ventricular systolic pressure is 18.5 mmHg. Left Atrium: Left atrial size was not well visualized. Right Atrium: Right atrial size was not well visualized. Pericardium: Trivial pericardial effusion is present. Mitral Valve: The mitral valve is normal in structure. No evidence of mitral valve regurgitation. No evidence of mitral valve stenosis. Tricuspid Valve: The tricuspid valve is normal in structure. Tricuspid valve regurgitation is trivial. Aortic Valve: The aortic valve is tricuspid. Aortic valve regurgitation is not visualized. No aortic stenosis is present. Pulmonic Valve: The pulmonic valve was normal in structure. Pulmonic valve regurgitation is not visualized. Aorta: The aortic root was not well visualized. Venous: The inferior vena cava is normal in size with greater than 50%  respiratory variability, suggesting right atrial pressure of 3 mmHg. IAS/Shunts: The interatrial septum was not well visualized.  LEFT VENTRICLE PLAX 2D LVIDd:         3.40 cm LVIDs:         2.90 cm LV PW:         1.10 cm LV IVS:        1.20 cm  LEFT ATRIUM         Index LA diam:    5.00 cm 2.67 cm/m   AORTA Ao Root diam: 3.40 cm MITRAL VALVE               TRICUSPID VALVE MV Area (PHT): 2.08 cm    TR Peak grad:   26.8 mmHg MV Decel Time: 364 msec    TR Vmax:        259.00 cm/s MV E velocity: 53.60 cm/s MV A velocity: 68.60 cm/s MV E/A ratio:  0.78 Loralie Champagne MD Electronically signed by Loralie Champagne MD Signature Date/Time: 11/06/2020/9:00:08 PM    Final  US Abdomen Limited RUQ (LIVER/GB)  Result Date: 11/06/2020 CLINICAL DATA:  Transaminitis. Patient is not NPO for the current exam. EXAM: ULTRASOUND ABDOMEN LIMITED RIGHT UPPER QUADRANT COMPARISON:  Noncontrast abdominopelvic CT 04/02/2020 FINDINGS: Gallbladder: Physiologically distended containing intraluminal sludge and stones. No gallbladder wall thickening. Trace free fluid adjacent to the fundus. No sonographic Murphy sign noted by sonographer. Common bile duct: Diameter: 3-4 mm, normal. Liver: Heterogeneously increased in parenchymal echogenicity. Technically limited evaluation of the liver due to shadowing bowel gas. No evidence of focal lesion. Portal vein is patent on color Doppler imaging with normal direction of blood flow towards the liver. Other: None. IMPRESSION: 1. Heterogeneous increased hepatic parenchymal echogenicity typical of steatosis. 2. Stones and sludge within the gallbladder. There is trace pericholecystic fluid, but no other findings of acute cholecystitis. No biliary dilatation. Electronically Signed   By: Keith Rake M.D.   On: 11/06/2020 21:13     CT ABDOMEN PELVIS W CONTRAST  Result Date: 11/07/2020 CLINICAL DATA:  75 year old male with history of anemia and unintended weight loss. EXAM: CT ABDOMEN AND PELVIS WITH  CONTRAST TECHNIQUE: Multidetector CT imaging of the abdomen and pelvis was performed using the standard protocol following bolus administration of intravenous contrast. CONTRAST:  162mL OMNIPAQUE IOHEXOL 300 MG/ML  SOLN COMPARISON:  04/02/2018.  CT the abdomen and pelvis FINDINGS: Lower chest: Atherosclerotic calcifications in the thoracic aorta as well as the left main, left anterior descending, left circumflex and right coronary arteries. Hepatobiliary: No suspicious cystic or solid hepatic lesions. No intra or extrahepatic biliary ductal dilatation. Partially calcified gallstone measuring 1.5 cm in diameter in the neck of the gallbladder which is nearly decompressed. No pericholecystic fluid or inflammatory changes. Pancreas: Ill-defined hypovascular area in the head and uncinate process of the pancreas (axial image 40 of series 3), concerning for potential pancreatic mass. There are some calcifications in this region as well. Body and tail of the pancreas are otherwise normal in appearance. No pancreatic ductal dilatation. No peripancreatic fluid collections or inflammatory changes. Spleen: Unremarkable. Adrenals/Urinary Tract: Multiple low-attenuation lesions in the kidneys bilaterally, compatible with simple cysts, largest of which is in the upper pole of the right kidney measuring 4.6 cm in diameter. Other subcentimeter low-attenuation lesions in both kidneys are too small to definitively characterize, but are favored to represent tiny cysts. Bilateral adrenal glands are normal in appearance. No hydroureteronephrosis. Urinary bladder is nearly decompressed around an indwelling Foley balloon catheter, but is otherwise unremarkable in appearance. Stomach/Bowel: The appearance of the stomach is normal. There is no pathologic dilatation of small bowel or colon. Numerous colonic diverticulae are noted, without surrounding inflammatory changes to suggest an acute diverticulitis at this time. Normal appendix.  Vascular/Lymphatic: Aortic atherosclerosis with fusiform aneurysmal dilatation of the infrarenal abdominal aorta which measures up to 5.3 x 5.2 cm in diameter. There is also aneurysmal dilatation of the right common iliac artery which measures 2.0 cm in diameter. Small penetrating ulcer noted at the level of the aortic hiatus (axial image 17 of series 3). Small filling defect in the proximal portal vein (axial image 32 of series 3) compatible with thrombus. No lymphadenopathy noted in the abdomen or pelvis. Reproductive: Prostate gland and seminal vesicles are unremarkable in appearance. Other: No significant volume of ascites.  No pneumoperitoneum. Musculoskeletal: There are no aggressive appearing lytic or blastic lesions noted in the visualized portions of the skeleton. IMPRESSION: 1. Ill-defined hypovascular area in the head of the pancreas concerning for potential pancreatic neoplasm. Further evaluation with nonemergent  abdominal MRI with and without IV gadolinium with MRCP is recommended in the near future to better evaluate this finding. 2. 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 3. Cholelithiasis without evidence of acute cholecystitis at this time. 4. Aortic atherosclerosis, in addition to left main and 3 vessel coronary artery disease. In addition, there is a small penetrating ulcer of the anterior aspect of the distal descending thoracic aorta at the level of the aortic hiatus, fusiform aneurysmal dilatation of the infrarenal abdominal aorta which measures up to 5.3 x 5.2 cm in diameter, and aneurysmal dilatation of the right common iliac artery which measures 2.0 cm in diameter. Recommend follow-up CT/MR every 6 months and vascular consultation. This recommendation follows ACR consensus guidelines: White Paper of the ACR Incidental Findings Committee II on Vascular Findings. J Am Coll Radiol 2013; 10:789-794. 5. Colonic diverticulosis without evidence of acute diverticulitis at this time. 6.  Additional incidental findings, as above. Electronically Signed   By: Vinnie Langton M.D.   On: 11/07/2020 20:18   MR 3D Recon At Scanner  Result Date: 11/08/2020 CLINICAL DATA:  75 year old male with history of possible pancreatic head mass noted on prior CT examination. Follow-up study. EXAM: MRI ABDOMEN WITHOUT AND WITH CONTRAST (INCLUDING MRCP) TECHNIQUE: Multiplanar multisequence MR imaging of the abdomen was performed both before and after the administration of intravenous contrast. Heavily T2-weighted images of the biliary and pancreatic ducts were obtained, and three-dimensional MRCP images were rendered by post processing. CONTRAST:  89mL GADAVIST GADOBUTROL 1 MMOL/ML IV SOLN COMPARISON:  No prior abdominal MRI. CT the abdomen and pelvis 11/07/2020. FINDINGS: Comment: Today's study is limited by large amount of patient motion artifact. Lower chest: Unremarkable. Hepatobiliary: No discrete cystic or solid hepatic lesions are confidently identified on today's motion limited examination. No intra or extrahepatic biliary ductal dilatation noted on MRCP images. Common bile duct measures 3-4 mm in the porta hepatis. No filling defects in the common bile duct to suggest choledocholithiasis. Filling defects are present in the gallbladder, compatible with cholelithiasis. Gallbladder is not distended. No gallbladder wall thickening or pericholecystic fluid or inflammatory changes. Pancreas: No discrete pancreatic mass is confidently identified in the head of the pancreas or uncinate process. Throughout this region there are multiple tiny cystic areas largest of which measure only 6 mm and what appears to be side branch ectasia, without a well-defined larger cystic mass. No pancreatic ductal dilatation noted on MRCP images. No peripancreatic fluid collections or inflammatory changes. Spleen:  Unremarkable. Adrenals/Urinary Tract: Multiple T1 hypointense, T2 hyperintense, nonenhancing lesions are noted in both  kidneys, compatible with simple cysts, largest of which is in the medial aspect of the upper pole of the right kidney measuring 5.1 x 4.6 cm. In addition, in the posterior aspect of the upper pole the left kidney there is a 2.4 x 1.9 cm T1 hyperintense, T2 hypointense, nonenhancing lesion, compatible with a proteinaceous/hemorrhagic cyst. No hydroureteronephrosis in the visualized portions of the abdomen. Bilateral adrenal glands are normal in appearance. Stomach/Bowel: Visualized portions are unremarkable. Vascular/Lymphatic: No known infrarenal abdominal aortic aneurysm is only partially imaged, better demonstrated on yesterday's CT the abdomen and pelvis. Likewise, the filling defect in the proximal portal vein is evident on today's study, but better demonstrated on yesterday's CT examination. No lymphadenopathy noted in the abdomen. Other: No significant volume of ascites noted in the visualized portions of the peritoneal cavity. Musculoskeletal: No aggressive appearing osseous lesions are noted in the visualized portions of the skeleton. IMPRESSION: 1. No definite  pancreatic mass confidently identified. 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 Recommend follow up pre and post contrast MRI/MRCP or pancreatic protocol CT in 2 years. This recommendation follows ACR consensus guidelines: Management of Incidental Pancreatic Cysts: A White Paper of the ACR Incidental Findings Committee. J Am Coll Radiol 2017;14:911-923. 2. 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 3. Nonocclusive portal vein thrombus again noted, but better demonstrated on yesterday's CT examination. 4. Infrarenal abdominal aortic aneurysm better demonstrated on yesterday's CT examination which measured up to 5.3 x 5.2 cm in diameter. Electronically  Signed   By: Vinnie Langton M.D.   On: 11/08/2020 12:26   DG Chest Portable 1 View  Result Date: 11/06/2020 CLINICAL DATA:  Shortness of breath with exertion over the last 4 days. EXAM: PORTABLE CHEST 1 VIEW COMPARISON:  04/08/2008.  CT 08/29/2020. FINDINGS: Previous right upper lobectomy. Heart size is normal. Chronic aortic atherosclerotic calcification. Residual lung appears clear. No evidence of edema, infiltrate, collapse or effusion. IMPRESSION: Previous right upper lobectomy.  No active disease. Aortic atherosclerosis. Electronically Signed   By: Nelson Chimes M.D.   On: 11/06/2020 13:48   MR ABDOMEN MRCP W WO CONTAST  Result Date: 11/08/2020 CLINICAL DATA:  75 year old male with history of possible pancreatic head mass noted on prior CT examination. Follow-up study. EXAM: MRI ABDOMEN WITHOUT AND WITH CONTRAST (INCLUDING MRCP) TECHNIQUE: Multiplanar multisequence MR imaging of the abdomen was performed both before and after the administration of intravenous contrast. Heavily T2-weighted images of the biliary and pancreatic ducts were obtained, and three-dimensional MRCP images were rendered by post processing. CONTRAST:  37mL GADAVIST GADOBUTROL 1 MMOL/ML IV SOLN COMPARISON:  No prior abdominal MRI. CT the abdomen and pelvis 11/07/2020. FINDINGS: Comment: Today's study is limited by large amount of patient motion artifact. Lower chest: Unremarkable. Hepatobiliary: No discrete cystic or solid hepatic lesions are confidently identified on today's motion limited examination. No intra or extrahepatic biliary ductal dilatation noted on MRCP images. Common bile duct measures 3-4 mm in the porta hepatis. No filling defects in the common bile duct to suggest choledocholithiasis. Filling defects are present in the gallbladder, compatible with cholelithiasis. Gallbladder is not distended. No gallbladder wall thickening or pericholecystic fluid or inflammatory changes. Pancreas: No discrete pancreatic mass is  confidently identified in the head of the pancreas or uncinate process. Throughout this region there are multiple tiny cystic areas largest of which measure only 6 mm and what appears to be side branch ectasia, without a well-defined larger cystic mass. No pancreatic ductal dilatation noted on MRCP images. No peripancreatic fluid collections or inflammatory changes. Spleen:  Unremarkable. Adrenals/Urinary Tract: Multiple T1 hypointense, T2 hyperintense, nonenhancing lesions are noted in both kidneys, compatible with simple cysts, largest of which is in the medial aspect of the upper pole of the right kidney measuring 5.1 x 4.6 cm. In addition, in the posterior aspect of the upper pole the left kidney there is a 2.4 x 1.9 cm T1 hyperintense, T2 hypointense, nonenhancing lesion, compatible with a proteinaceous/hemorrhagic cyst. No hydroureteronephrosis in the visualized portions of the abdomen. Bilateral adrenal glands are normal in appearance. Stomach/Bowel: Visualized portions are unremarkable. Vascular/Lymphatic: No known infrarenal abdominal aortic aneurysm is only partially imaged, better demonstrated on yesterday's CT the abdomen and pelvis. Likewise, the filling defect in the proximal portal vein is evident  on today's study, but better demonstrated on yesterday's CT examination. No lymphadenopathy noted in the abdomen. Other: No significant volume of ascites noted in the visualized portions of the peritoneal cavity. Musculoskeletal: No aggressive appearing osseous lesions are noted in the visualized portions of the skeleton. IMPRESSION: 1. No definite pancreatic mass confidently identified. 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 Recommend follow up pre and post contrast MRI/MRCP or pancreatic protocol CT in 2 years. This recommendation follows ACR consensus  guidelines: Management of Incidental Pancreatic Cysts: A White Paper of the ACR Incidental Findings Committee. J Am Coll Radiol 2017;14:911-923. 2. 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 3. Nonocclusive portal vein thrombus again noted, but better demonstrated on yesterday's CT examination. 4. Infrarenal abdominal aortic aneurysm better demonstrated on yesterday's CT examination which measured up to 5.3 x 5.2 cm in diameter. Electronically Signed   By: Vinnie Langton M.D.   On: 11/08/2020 12:26   ECHOCARDIOGRAM COMPLETE  Result Date: 11/06/2020    ECHOCARDIOGRAM REPORT   Patient Name:   [name] Date of Exam: 11/06/2020 Medical Rec #:  [id number]       Height:       72.0 in Accession #:    [id number]      Weight:       147.5 lb Date of Birth:  1945/09/19       BSA:          1.872 m Patient Age:    81 years        BP:           103/68 mmHg Patient Gender: M               HR:           78 bpm. Exam Location:  Inpatient Procedure: 2D Echo, Cardiac Doppler, Color Doppler and Intracardiac            Opacification Agent STAT ECHO Indications:    R01.1 Murmur  History:        Patient has prior history of Echocardiogram examinations, most                 recent 04/30/2006.  Sonographer:    Merrie Roof RDCS Referring Phys: [id number] ERIC C KATZ  Sonographer Comments: Technically difficult study due to poor echo windows. IMPRESSIONS  1. Left ventricular ejection fraction, by estimation, is 55 to 60%. The left ventricle has normal function. Left ventricular endocardial border not optimally defined to evaluate regional wall motion. There is mild left ventricular hypertrophy. Left ventricular diastolic parameters are consistent with Grade I diastolic dysfunction (impaired relaxation).  2. Right ventricular systolic function was not well visualized. The right ventricular size is not well visualized. There is normal pulmonary artery systolic pressure. The estimated right  ventricular systolic pressure is 09.4 mmHg.  3. The mitral valve is normal in structure. No evidence of mitral valve regurgitation. No evidence of mitral stenosis.  4. The aortic valve is tricuspid. Aortic valve regurgitation is not visualized. No aortic stenosis is present.  5. The inferior vena cava is normal in size with greater than 50% respiratory variability, suggesting right atrial pressure of 3 mmHg.  6. Technically difficult study with poor acoustic windows. FINDINGS  Left Ventricle: Left ventricular ejection fraction, by estimation, is 55 to 60%. The left  ventricle has normal function. Left ventricular endocardial border not optimally defined to evaluate regional wall motion. Definity contrast agent was given IV to delineate the left ventricular endocardial borders. The left ventricular internal cavity size was normal in size. There is mild left ventricular hypertrophy. Left ventricular diastolic parameters are consistent with Grade I diastolic dysfunction (impaired relaxation). Right Ventricle: The right ventricular size is not well visualized. Right vetricular wall thickness was not well visualized. Right ventricular systolic function was not well visualized. There is normal pulmonary artery systolic pressure. The tricuspid regurgitant velocity is 2.59 m/s, and with an assumed right atrial pressure of 3 mmHg, the estimated right ventricular systolic pressure is 16.1 mmHg. Left Atrium: Left atrial size was not well visualized. Right Atrium: Right atrial size was not well visualized. Pericardium: Trivial pericardial effusion is present. Mitral Valve: The mitral valve is normal in structure. No evidence of mitral valve regurgitation. No evidence of mitral valve stenosis. Tricuspid Valve: The tricuspid valve is normal in structure. Tricuspid valve regurgitation is trivial. Aortic Valve: The aortic valve is tricuspid. Aortic valve regurgitation is not visualized. No aortic stenosis is present. Pulmonic Valve:  The pulmonic valve was normal in structure. Pulmonic valve regurgitation is not visualized. Aorta: The aortic root was not well visualized. Venous: The inferior vena cava is normal in size with greater than 50% respiratory variability, suggesting right atrial pressure of 3 mmHg. IAS/Shunts: The interatrial septum was not well visualized.  LEFT VENTRICLE PLAX 2D LVIDd:         3.40 cm LVIDs:         2.90 cm LV PW:         1.10 cm LV IVS:        1.20 cm  LEFT ATRIUM         Index LA diam:    5.00 cm 2.67 cm/m   AORTA Ao Root diam: 3.40 cm MITRAL VALVE               TRICUSPID VALVE MV Area (PHT): 2.08 cm    TR Peak grad:   26.8 mmHg MV Decel Time: 364 msec    TR Vmax:        259.00 cm/s MV E velocity: 53.60 cm/s MV A velocity: 68.60 cm/s MV E/A ratio:  0.78 Loralie Champagne MD Electronically signed by Loralie Champagne MD Signature Date/Time: 11/06/2020/9:00:08 PM    Final    US Abdomen Limited RUQ (LIVER/GB)  Result Date: 11/06/2020 CLINICAL DATA:  Transaminitis. Patient is not NPO for the current exam. EXAM: ULTRASOUND ABDOMEN LIMITED RIGHT UPPER QUADRANT COMPARISON:  Noncontrast abdominopelvic CT 04/02/2020 FINDINGS: Gallbladder: Physiologically distended containing intraluminal sludge and stones. No gallbladder wall thickening. Trace free fluid adjacent to the fundus. No sonographic Murphy sign noted by sonographer. Common bile duct: Diameter: 3-4 mm, normal. Liver: Heterogeneously increased in parenchymal echogenicity. Technically limited evaluation of the liver due to shadowing bowel gas. No evidence of focal lesion. Portal vein is patent on color Doppler imaging with normal direction of blood flow towards the liver. Other: None. IMPRESSION: 1. Heterogeneous increased hepatic parenchymal echogenicity typical of steatosis. 2. Stones and sludge within the gallbladder. There is trace pericholecystic fluid, but no other findings of acute cholecystitis. No biliary dilatation. Electronically Signed   By: Keith Rake  M.D.   On: 11/06/2020 21:13   Assessment and Plan:  1.  CT with questionable mass in the head of the pancreas/no definite mass on MRI but small cystic lesions noted in head of  pancreas 2.  Elevated CA 19.9 3.  Nonocclusive portal vein thrombosis 4.  Macrocytic anemia 5.  Mild thrombocytopenia noted earlier this admission, now resolved 6.  History of stage Ib (T2, N0, M0) non-small cell lung cancer diagnosed 04/2006, status post right upper lobectomy with mediastinal lymph node dissection, 8 cm tumor, refused adjuvant chemotherapy 7.  Sepsis secondary to Enterobacter bacteremia 8.  Protein calorie malnutrition/weight loss/cachexia 9.  Tobacco and alcohol dependence 10.  Hepatic steatosis 11.  HFpEF 12.  Triple-vessel CAD 13.  History of NSTEMI  14.  AAA  -Imaging results reviewed.  CT concerning for pancreatic mass, but not seen on MRCP.  He does have a mildly elevated CA 19.9.  Unclear if CA 19.9 elevated due to underlying malignancy versus liver disease.  Imaging did show hepatic steatosis and I have to wonder if he has underlying cirrhosis given alcohol abuse history.  AFP is also pending. -I did not see anything obvious to biopsy on current imaging.  GI also consulted and the patient may benefit from endoscopy due to significant weight loss. -Given his history of lung cancer, we could consider repeating a CT of the chest.  However, CT of the chest performed in March 2022 did not show any obvious evidence of disease recurrence.  He is about 15 years out from his initial diagnosis. -The patient has a nonocclusive portal vein thrombosis.  Again, I have to wonder if this is related to underlying liver disease.  Continue heparin drip.  Can consider switching him to DOAC once procedures are completed. -The patient has macrocytic anemia.  Vitamin B12 was elevated and folate was normal.  Ferritin elevated with a slightly low iron level.  We will monitor for now.  Anemia likely due to underlying  alcohol abuse. -Antibiotics per primary team and ID for bacteremia. -Management of chronic medical conditions per primary team.  Thank you for this referral.   Mikey Bussing, DNP, AGPCNP-BC, AOCNP   ADDENDUM: I saw and examined [name].  I just have a hard time believing that he has malignancy.  Just because of an elevated CA 19-9, that this does not that there is any malignancy.  He does have a heavy alcohol use.  He drinks gin.  He has had his last drink was about a month ago.  He has been followed by gastroenterology.  He looks at they will do upper and lower endoscopy on him.  He does have bacteremia.  He does have his nonocclusive thrombus in the portal vein.  Have to believe this is not acute.  I am not sure that he really needs any kind of long-term anticoagulation.  He does smoke.  I suspect he probably smokes a little over 1 pack/day.  He has had a remote history of lung cancer.  I would be surprised if this was recurrent.  He had a large tumor but this was confined to the lung.  There is no way to say that he has cancer until you have a tissue diagnosis.  If the upper and lower endoscopy are unremarkable, I would just watch.  He says that he is lost weight over 3 years.  He says he lost 60 pounds over 3 years.  He is very nice.  Again, unless we have a tissue diagnosis of cancer, I just do not know what else we can do about this problem.  I think the most severe intervention would be a laparoscopic exploration.  I suppose this would be a possibility  although this would clearly be highly aggressive.  With think that he had peritoneal malignancy, it would be seen on a CT scan.  For right now, we will just follow along.  I just do not think there is much for [place] to do.  I am not sure I would entertain any oral anticoagulation with him.  I would worry about something happening to him and he have a bleed.  Again, Mr. Mcevoy is quite nice.  Lattie Haw, MD  2  [place] 5:7

## 2020-11-10 NOTE — Progress Notes (Signed)
Occupational Therapy Treatment Patient Details Name: [name] MRN: [id number] DOB: 1945-09-20 Today's Date: 11/10/2020    History of present illness 75 year old male presented to the ED 5/23 with progressive weight loss over the last 6 months and new onset fatigue, dyspnea on exertion and generalized weakness over the last week. Found to have gram-negative bacteremia and AKI and admitted. PMH:  AAA, CKD stage II, CAD, hypertension, colonic polyposis, gout, alcohol use disorder.   OT comments  Pt making progress with functional goals. Session focused on OOB, standing with RW, functional mobility safety using RW, LB ADLs standing, grooming standing, toilet transfers and toileting tasks. OT will continue to follow acutely to maximize level of function and safety  Follow Up Recommendations  SNF    Equipment Recommendations  Tub/shower seat;Other (comment) (TBD at next venue of care)    Recommendations for Other Services      Precautions / Restrictions Precautions Precautions: None Restrictions Weight Bearing Restrictions: No       Mobility Bed Mobility Overal bed mobility: Needs Assistance Bed Mobility: Supine to Sit;Sit to Supine     Supine to sit: Supervision Sit to supine: Supervision   General bed mobility comments: supervision for safety    Transfers Overall transfer level: Needs assistance Equipment used: Rolling walker (2 wheeled) Transfers: Sit to/from Stand Sit to Stand: Min guard         General transfer comment: min guard for safety    Balance Overall balance assessment: Needs assistance Sitting-balance support: Feet supported;No upper extremity supported Sitting balance-Leahy Scale: Fair     Standing balance support: Bilateral upper extremity supported;Single extremity supported;During functional activity Standing balance-Leahy Scale: Poor                             ADL either performed or assessed with clinical judgement   ADL  Overall ADL's : Needs assistance/impaired     Grooming: Wash/dry hands;Standing;Wash/dry face;Min guard       Lower Body Bathing: Min guard;Sit to/from stand Lower Body Bathing Details (indicate cue type and reason): simulated standing at EOB     Lower Body Dressing: Min guard;Sit to/from stand   Toilet Transfer: Ambulation;RW;Min guard;Cueing for safety;Regular Toilet;BSC;Grab bars   Toileting- Water quality scientist and Hygiene: Min guard;Sit to/from stand       Functional mobility during ADLs: Rolling walker;Min guard       Vision Patient Visual Report: No change from baseline     Perception     Praxis      Cognition Arousal/Alertness: Awake/alert Behavior During Therapy: WFL for tasks assessed/performed;Flat affect Overall Cognitive Status: Impaired/Different from baseline Area of Impairment: Safety/judgement;Problem solving                         Safety/Judgement: Decreased awareness of deficits   Problem Solving: Requires verbal cues;Slow processing          Exercises     Shoulder Instructions       General Comments      Pertinent Vitals/ Pain       Pain Assessment: No/denies pain  Home Living                                          Prior Functioning/Environment  Frequency  Min 2X/week        Progress Toward Goals  OT Goals(current goals can now be found in the care plan section)  Progress towards OT goals: Progressing toward goals  Acute Rehab OT Goals Patient Stated Goal: get strength back, go home  Plan Discharge plan remains appropriate    Co-evaluation                 AM-PAC OT "6 Clicks" Daily Activity     Outcome Measure   Help from another person eating meals?: None Help from another person taking care of personal grooming?: A Little Help from another person toileting, which includes using toliet, bedpan, or urinal?: A Little Help from another person bathing  (including washing, rinsing, drying)?: A Little Help from another person to put on and taking off regular upper body clothing?: None Help from another person to put on and taking off regular lower body clothing?: A Little 6 Click Score: 20    End of Session Equipment Utilized During Treatment: Rolling walker;Gait belt  OT Visit Diagnosis: Unsteadiness on feet (R26.81);Other abnormalities of gait and mobility (R26.89);Muscle weakness (generalized) (M62.81);Other symptoms and signs involving cognitive function   Activity Tolerance Patient limited by fatigue   Patient Left in bed;with call bell/phone within reach;with bed alarm set   Nurse Communication          Time: 0355-9741 OT Time Calculation (min): 23 min  Charges: OT General Charges $OT Visit: 1 Visit OT Treatments $Self Care/Home Management : 8-22 mins $Therapeutic Activity: 8-22 mins     Britt Bottom 11/10/2020, 2:27 PM

## 2020-11-10 NOTE — Progress Notes (Signed)
[facility] for IV Heparin Indication:  Portal Vein Thrombosis  Allergies  Allergen Reactions  . Candesartan Cilexetil-Hctz Other (See Comments)    Acute renal failure  . Hydrochlorothiazide Other (See Comments)    Acute renal failure  . Shellfish Allergy Other (See Comments)    Gout occurs    Patient Measurements: Height: 6\' 1"  (185.4 cm) Weight: 62.8 kg (138 lb 7.2 oz) IBW/kg (Calculated) : 79.9 Heparin Dosing Weight:  62.8 kg  Vital Signs: Temp: 97.7 F (36.5 C) (05/27 0851) Temp Source: Oral (05/27 0851) BP: 108/66 (05/27 0851) Pulse Rate: 62 (05/27 0851)  Labs: Recent Labs    11/08/20 0408 11/08/20 1533 11/09/20 0316 11/09/20 1240 11/09/20 1901 11/10/20 0342 11/10/20 0621 11/10/20 1050  HGB 9.9* 9.9* 9.5*  --   --  9.2*  --   --   HCT 29.1* 29.6* 28.5*  --   --  27.5*  --   --   PLT 131* 131* 132*  --   --  150  --   --   HEPARINUNFRC  --   --  0.12*   < > 0.25* 0.47  --  0.51  CREATININE 1.20  --  1.12  --   --   --  1.09  --    < > = values in this interval not displayed.    Estimated Creatinine Clearance: 52.8 mL/min (by C-G formula based on SCr of 1.09 mg/dL).  Assessment: 74 yr old man admitted on 11/06/20 with generalized weakness, unintentional weight loss, sepsis, pancreatic mass, AAA (unchanged from 08/2020), non-obstructive portal vein thrombosis confirmed on MRI (poss predisposing factors include underlying malignancy vs cirrhosis given extensive alcohol use). Pharmacy is consulted for IV heparin for portal vein thrombosis. Pt was not on anticoagulant PTA.  Heparin level is therapeutic at 0.51 on 1350 units/hr.  No overt bleeding or complications noted, Hgb is low at 9.2, platelets are normal.  Goal of Therapy:  Heparin level 0.3-0.7 units/ml Monitor platelets by anticoagulation protocol: Yes   Plan:  Continue heparin drip at 1350 units/hr Monitor daily heparin level, CBC closely Monitor for bleeding F/U  transition to oral anticoagulant when able  Thank you for involving pharmacy in this patient's care.  Renold Genta, PharmD, BCPS Clinical Pharmacist Clinical phone for 11/10/2020 until 3p is [phone] 11/10/2020 1:37 PM  **Pharmacist phone directory can be found on [email or web address] listed under [place]**

## 2020-11-10 NOTE — Care Management Important Message (Signed)
Important Message  Patient Details  Name: [name] MRN: [id number] Date of Birth: 1945/09/25   Medicare Important Message Given:  Yes - Important Message mailed due to current National Emergency   Verbal consent obtained due to current National Emergency  Relationship to patient: Self Contact Name: Misha Call Date: 11/10/20  Time: 0907 Phone: [phone] Outcome: Spoke with contact Important Message mailed to: Patient address on file    Delorse Lek 11/10/2020, 9:07 AM

## 2020-11-10 NOTE — Progress Notes (Signed)
Subjective: No new complaints   Antibiotics:  Anti-infectives (From admission, onward)   Start     Dose/Rate Route Frequency Ordered Stop   11/11/20 1000  sulfamethoxazole-trimethoprim (BACTRIM DS) 800-160 MG per tablet 1 tablet        1 tablet Oral Every 12 hours 11/10/20 0946 11/14/20 0959   11/07/20 2200  ceFEPIme (MAXIPIME) 2 g in sodium chloride 0.9 % 100 mL IVPB        2 g 200 mL/hr over 30 Minutes Intravenous Every 12 hours 11/07/20 0959 11/10/20 2359   11/07/20 0930  ceFEPIme (MAXIPIME) 2 g in sodium chloride 0.9 % 100 mL IVPB        2 g 200 mL/hr over 30 Minutes Intravenous  Once 11/07/20 0830 11/07/20 1113      Medications: Scheduled Meds: . allopurinol  100 mg Oral QHS  . Chlorhexidine Gluconate Cloth  6 each Topical Q0600  . feeding supplement  237 mL Oral TID BM  . metoprolol tartrate  12.5 mg Oral BID  . multivitamin with minerals  1 tablet Oral Daily  . ramelteon  8 mg Oral QHS  . rosuvastatin  20 mg Oral Daily  . sodium chloride flush  3 mL Intravenous Q12H  . [START ON 11/11/2020] sulfamethoxazole-trimethoprim  1 tablet Oral Q12H  . tamsulosin  0.8 mg Oral QPM   Continuous Infusions: . sodium chloride Stopped (11/07/20 2300)  . ceFEPime (MAXIPIME) IV Stopped (11/10/20 1014)  . heparin 1,350 Units/hr (11/10/20 1159)  . potassium PHOSPHATE IVPB (in mmol) 42 mL/hr at 11/10/20 1159   PRN Meds:.sodium chloride, acetaminophen, HYDROcodone-acetaminophen, ondansetron (ZOFRAN) IV    Objective: Weight change: -2.4 kg  Intake/Output Summary (Last 24 hours) at 11/10/2020 1428 Last data filed at 11/10/2020 1159 Gross per 24 hour  Intake 1164.93 ml  Output 2150 ml  Net -985.07 ml   Blood pressure 108/66, pulse 62, temperature 97.7 F (36.5 C), temperature source Oral, resp. rate 18, height 6\' 1"  (1.854 m), weight 62.8 kg, SpO2 97 %. Temp:  [97.7 F (36.5 C)-98.6 F (37 C)] 97.7 F (36.5 C) (05/27 0851) Pulse Rate:  [60-72] 62 (05/27 0851) Resp:   [18-20] 18 (05/27 0851) BP: (102-110)/(46-66) 108/66 (05/27 0851) SpO2:  [96 %-100 %] 97 % (05/27 0851) Weight:  [62.8 kg] 62.8 kg (05/27 0700)  Physical Exam: Physical Exam Constitutional:      Appearance: He is well-developed.  HENT:     Head: Normocephalic and atraumatic.  Eyes:     Conjunctiva/sclera: Conjunctivae normal.  Cardiovascular:     Rate and Rhythm: Normal rate and regular rhythm.  Pulmonary:     Effort: Pulmonary effort is normal. No respiratory distress.     Breath sounds: No stridor. No wheezing or rhonchi.  Abdominal:     General: There is no distension.     Palpations: Abdomen is soft.  Musculoskeletal:        General: Normal range of motion.     Cervical back: Normal range of motion and neck supple.  Skin:    General: Skin is warm and dry.     Findings: No erythema or rash.  Neurological:     General: No focal deficit present.     Mental Status: He is alert and oriented to person, place, and time.  Psychiatric:        Attention and Perception: Attention normal.        Mood and Affect: Mood and affect normal.  Speech: Speech normal.        Behavior: Behavior normal.        Cognition and Memory: Memory is impaired. He exhibits impaired recent memory.      CBC:    BMET Recent Labs    11/09/20 0316 11/10/20 0621  NA 136 138  K 3.7 4.0  CL 104 106  CO2 29 27  GLUCOSE 94 93  BUN 13 11  CREATININE 1.12 1.09  CALCIUM 8.8* 8.7*     Liver Panel  Recent Labs    11/09/20 0316 11/10/20 0621  PROT 5.1* 5.1*  ALBUMIN 1.7* 1.7*  AST 53* 48*  ALT 41 38  ALKPHOS 76 55  BILITOT 0.9 0.5       Sedimentation Rate No results for input(s): ESRSEDRATE in the last 72 hours. C-Reactive Protein No results for input(s): CRP in the last 72 hours.  Micro Results: Recent Results (from the past 720 hour(s))  Blood culture (routine x 2)     Status: Abnormal   Collection Time: 11/06/20  1:19 PM   Specimen: BLOOD  Result Value Ref Range  Status   Specimen Description BLOOD LEFT ANTECUBITAL  Final   Special Requests   Final    BOTTLES DRAWN AEROBIC AND ANAEROBIC Blood Culture adequate volume   Culture  Setup Time   Final    GRAM NEGATIVE RODS IN BOTH AEROBIC AND ANAEROBIC BOTTLES    Culture (A)  Final    ENTEROBACTER SAKAZAKII SUSCEPTIBILITIES PERFORMED ON PREVIOUS CULTURE WITHIN THE LAST 5 DAYS. Performed at [facility], [place] [address] [place], [place] [address]    Report Status 11/09/2020 FINAL  Final  Blood culture (routine x 2)     Status: Abnormal   Collection Time: 11/06/20  1:28 PM   Specimen: BLOOD  Result Value Ref Range Status   Specimen Description BLOOD RIGHT ANTECUBITAL  Final   Special Requests   Final    BOTTLES DRAWN AEROBIC AND ANAEROBIC Blood Culture adequate volume   Culture  Setup Time   Final    GRAM NEGATIVE RODS GRAM POSITIVE COCCI ANAEROBIC BOTTLE ONLY CRITICAL RESULT CALLED TO, READ BACK BY AND VERIFIED WITH: PHARMD C.AMEND 11/07/2020 AT 0610 A.HUGHES    Culture (A)  Final    ENTEROBACTER SAKAZAKII STAPHYLOCOCCUS HOMINIS THE SIGNIFICANCE OF ISOLATING THIS ORGANISM FROM A SINGLE SET OF BLOOD CULTURES WHEN MULTIPLE SETS ARE DRAWN IS UNCERTAIN. PLEASE NOTIFY THE MICROBIOLOGY DEPARTMENT WITHIN ONE WEEK IF SPECIATION AND SENSITIVITIES ARE REQUIRED. Performed at [facility], [place] [address], [place], [place] [address]    Report Status 11/09/2020 FINAL  Final   Organism ID, Bacteria ENTEROBACTER SAKAZAKII  Final      Susceptibility   Enterobacter sakazakii - MIC*    CEFAZOLIN 16 SENSITIVE Sensitive     CEFEPIME <=0.12 SENSITIVE Sensitive     CEFTAZIDIME <=1 SENSITIVE Sensitive     CEFTRIAXONE <=0.25 SENSITIVE Sensitive     CIPROFLOXACIN <=0.25 SENSITIVE Sensitive     GENTAMICIN <=1 SENSITIVE Sensitive     IMIPENEM 0.5 SENSITIVE Sensitive     TRIMETH/SULFA <=20 SENSITIVE Sensitive     PIP/TAZO <=4 SENSITIVE Sensitive     * ENTEROBACTER SAKAZAKII  Blood Culture ID Panel  (Reflexed)     Status: Abnormal   Collection Time: 11/06/20  1:28 PM  Result Value Ref Range Status   Enterococcus faecalis NOT DETECTED NOT DETECTED Final   Enterococcus Faecium NOT DETECTED NOT DETECTED Final   Listeria monocytogenes NOT  DETECTED NOT DETECTED Final   Staphylococcus species DETECTED (A) NOT DETECTED Final    Comment: CRITICAL RESULT CALLED TO, READ BACK BY AND VERIFIED WITH: PHARMD C.AMEND 11/07/2020 AT 0610 A.HUGHES    Staphylococcus aureus (BCID) NOT DETECTED NOT DETECTED Final   Staphylococcus epidermidis DETECTED (A) NOT DETECTED Final    Comment: Methicillin (oxacillin) resistant coagulase negative staphylococcus. Possible blood culture contaminant (unless isolated from more than one blood culture draw or clinical case suggests pathogenicity). No antibiotic treatment is indicated for blood  culture contaminants. CRITICAL RESULT CALLED TO, READ BACK BY AND VERIFIED WITH: PHARMD C.AMEND 11/07/2020 AT 0610 A.HUGHES    Staphylococcus lugdunensis NOT DETECTED NOT DETECTED Final   Streptococcus species NOT DETECTED NOT DETECTED Final   Streptococcus agalactiae NOT DETECTED NOT DETECTED Final   Streptococcus pneumoniae NOT DETECTED NOT DETECTED Final   Streptococcus pyogenes NOT DETECTED NOT DETECTED Final   A.calcoaceticus-baumannii NOT DETECTED NOT DETECTED Final   Bacteroides fragilis NOT DETECTED NOT DETECTED Final   Enterobacterales DETECTED (A) NOT DETECTED Final    Comment: Enterobacterales represent a large order of gram negative bacteria, not a single organism. Refer to culture for further identification. CRITICAL RESULT CALLED TO, READ BACK BY AND VERIFIED WITH: PHARMD C.AMEND 11/07/2020 AT 0610 A.HUGHES    Enterobacter cloacae complex NOT DETECTED NOT DETECTED Final   Escherichia coli NOT DETECTED NOT DETECTED Final   Klebsiella aerogenes NOT DETECTED NOT DETECTED Final   Klebsiella oxytoca NOT DETECTED NOT DETECTED Final   Klebsiella pneumoniae NOT  DETECTED NOT DETECTED Final   Proteus species NOT DETECTED NOT DETECTED Final   Salmonella species NOT DETECTED NOT DETECTED Final   Serratia marcescens NOT DETECTED NOT DETECTED Final   Haemophilus influenzae NOT DETECTED NOT DETECTED Final   Neisseria meningitidis NOT DETECTED NOT DETECTED Final   Pseudomonas aeruginosa NOT DETECTED NOT DETECTED Final   Stenotrophomonas maltophilia NOT DETECTED NOT DETECTED Final   Candida albicans NOT DETECTED NOT DETECTED Final   Candida auris NOT DETECTED NOT DETECTED Final   Candida glabrata NOT DETECTED NOT DETECTED Final   Candida krusei NOT DETECTED NOT DETECTED Final   Candida parapsilosis NOT DETECTED NOT DETECTED Final   Candida tropicalis NOT DETECTED NOT DETECTED Final   Cryptococcus neoformans/gattii NOT DETECTED NOT DETECTED Final   CTX-M ESBL NOT DETECTED NOT DETECTED Final   Carbapenem resistance IMP NOT DETECTED NOT DETECTED Final   Carbapenem resistance KPC NOT DETECTED NOT DETECTED Final   Methicillin resistance mecA/C DETECTED (A) NOT DETECTED Final    Comment: CRITICAL RESULT CALLED TO, READ BACK BY AND VERIFIED WITH: PHARMD C.AMEND 11/07/2020 AT 0610 A.HUGHES    Carbapenem resistance NDM NOT DETECTED NOT DETECTED Final   Carbapenem resist OXA 48 LIKE NOT DETECTED NOT DETECTED Final   Carbapenem resistance VIM NOT DETECTED NOT DETECTED Final    Comment: Performed at [facility], [place] [address], [place], [place] [address]  Resp Panel by RT-PCR (Flu A&B, Covid) Nasopharyngeal Swab     Status: None   Collection Time: 11/06/20  3:51 PM   Specimen: Nasopharyngeal Swab; Nasopharyngeal(NP) swabs in vial transport medium  Result Value Ref Range Status   SARS Coronavirus 2 by RT PCR NEGATIVE NEGATIVE Final    Comment: (NOTE) SARS-CoV-2 target nucleic acids are NOT DETECTED.  The SARS-CoV-2 RNA is generally detectable in upper respiratory specimens during the acute phase of infection. The lowest concentration of SARS-CoV-2  viral copies this assay can detect is 138 copies/mL. A negative result does  not preclude SARS-Cov-2 infection and should not be used as the sole basis for treatment or other patient management decisions. A negative result may occur with  improper specimen collection/handling, submission of specimen other than nasopharyngeal swab, presence of viral mutation(s) within the areas targeted by this assay, and inadequate number of viral copies(<138 copies/mL). A negative result must be combined with clinical observations, patient history, and epidemiological information. The expected result is Negative.  Fact Sheet for Patients:  EntrepreneurPulse.com.au  Fact Sheet for Healthcare Providers:  IncredibleEmployment.be  This test is no t yet approved or cleared by the [place] FDA and  has been authorized for detection and/or diagnosis of SARS-CoV-2 by FDA under an Emergency Use Authorization (EUA). This EUA will remain  in effect (meaning this test can be used) for the duration of the COVID-19 declaration under Section 564(b)(1) of the Act, 21 U.S.C.section 360bbb-3(b)(1), unless the authorization is terminated  or revoked sooner.       Influenza A by PCR NEGATIVE NEGATIVE Final   Influenza B by PCR NEGATIVE NEGATIVE Final    Comment: (NOTE) The Xpert Xpress SARS-CoV-2/FLU/RSV plus assay is intended as an aid in the diagnosis of influenza from Nasopharyngeal swab specimens and should not be used as a sole basis for treatment. Nasal washings and aspirates are unacceptable for Xpert Xpress SARS-CoV-2/FLU/RSV testing.  Fact Sheet for Patients: EntrepreneurPulse.com.au  Fact Sheet for Healthcare Providers: IncredibleEmployment.be  This test is not yet approved or cleared by the [place] FDA and has been authorized for detection and/or diagnosis of SARS-CoV-2 by FDA under an Emergency Use Authorization  (EUA). This EUA will remain in effect (meaning this test can be used) for the duration of the COVID-19 declaration under Section 564(b)(1) of the Act, 21 U.S.C. section 360bbb-3(b)(1), unless the authorization is terminated or revoked.  Performed at [facility], [place] [address] [place], [place] [address]   Urine culture     Status: Abnormal   Collection Time: 11/07/20  6:19 AM   Specimen: Urine, Random  Result Value Ref Range Status   Specimen Description URINE, RANDOM  Final   Special Requests   Final    NONE Performed at [facility], [place] [address], [place], [place] [address]    Culture MULTIPLE SPECIES PRESENT, SUGGEST RECOLLECTION (A)  Final   Report Status 11/08/2020 FINAL  Final  Culture, blood (routine x 2)     Status: None (Preliminary result)   Collection Time: 11/07/20  8:39 AM   Specimen: BLOOD  Result Value Ref Range Status   Specimen Description BLOOD LEFT ANTECUBITAL  Final   Special Requests   Final    BOTTLES DRAWN AEROBIC AND ANAEROBIC Blood Culture adequate volume   Culture  Setup Time   Final    GRAM NEGATIVE RODS IN BOTH AEROBIC AND ANAEROBIC BOTTLES CRITICAL VALUE NOTED.  VALUE IS CONSISTENT WITH PREVIOUSLY REPORTED AND CALLED VALUE.    Culture   Final    GRAM NEGATIVE RODS IDENTIFICATION TO FOLLOW Performed at [facility], [place] [address], [place], [place] [address]    Report Status PENDING  Incomplete  Culture, blood (routine x 2)     Status: None (Preliminary result)   Collection Time: 11/07/20  8:44 AM   Specimen: BLOOD RIGHT HAND  Result Value Ref Range Status   Specimen Description BLOOD RIGHT HAND  Final   Special Requests   Final    BOTTLES DRAWN AEROBIC AND ANAEROBIC Blood Culture adequate volume  Culture   Final    NO GROWTH 3 DAYS Performed at [facility], [place] [address], [place], [place] [address]    Report Status PENDING  Incomplete  Culture, Urine     Status: None   Collection Time: 11/08/20  8:39 PM    Specimen: Urine, Random  Result Value Ref Range Status   Specimen Description URINE, RANDOM  Final   Special Requests NONE  Final   Culture   Final    NO GROWTH Performed at [facility], [place] [address] [place], [place] [address]    Report Status 11/10/2020 FINAL  Final    Studies/Results: No results found.    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 male with E. coli bacteremia possibly due to urinary source though his symptoms do not suggest this, or potentially gut translocation but without deep infection.  2 days of positive blood cultures but that was before effective antibiotics were started.  He is now on day 4 of IV cefepime  He just needs 3 more days of antibiotics which can be switched over to oral bactrim DS BID  I spent greater than 35  minutes with the patient including greater than 50% of time in face to face counsel of the patient, eluding interpreting radiographs reviewing laboratory microbiological data and in coordination of his care.  I will sign off for now please call with further questions.   LOS: 3 days   Alcide Evener 11/10/2020, 2:28 PM

## 2020-11-10 NOTE — Plan of Care (Signed)
°  Problem: Clinical Measurements: °Goal: Cardiovascular complication will be avoided °Outcome: Progressing °  °Problem: Activity: °Goal: Risk for activity intolerance will decrease °Outcome: Progressing °  °

## 2020-11-11 ENCOUNTER — Encounter (HOSPITAL_COMMUNITY): Payer: Self-pay | Admitting: Internal Medicine

## 2020-11-11 DIAGNOSIS — I441 Atrioventricular block, second degree: Secondary | ICD-10-CM | POA: Diagnosis not present

## 2020-11-11 DIAGNOSIS — N179 Acute kidney failure, unspecified: Secondary | ICD-10-CM | POA: Diagnosis not present

## 2020-11-11 DIAGNOSIS — K8689 Other specified diseases of pancreas: Secondary | ICD-10-CM | POA: Diagnosis not present

## 2020-11-11 LAB — LIPID PANEL
Cholesterol: 74 mg/dL (ref 0–200)
HDL: 20 mg/dL — ABNORMAL LOW
LDL Cholesterol: 43 mg/dL (ref 0–99)
Total CHOL/HDL Ratio: 3.7 ratio
Triglycerides: 54 mg/dL
VLDL: 11 mg/dL (ref 0–40)

## 2020-11-11 LAB — CBC
HCT: 26 % — ABNORMAL LOW (ref 39.0–52.0)
Hemoglobin: 8.6 g/dL — ABNORMAL LOW (ref 13.0–17.0)
MCH: 33.9 pg (ref 26.0–34.0)
MCHC: 33.1 g/dL (ref 30.0–36.0)
MCV: 102.4 fL — ABNORMAL HIGH (ref 80.0–100.0)
Platelets: 128 10*3/uL — ABNORMAL LOW (ref 150–400)
RBC: 2.54 MIL/uL — ABNORMAL LOW (ref 4.22–5.81)
RDW: 13.6 % (ref 11.5–15.5)
WBC: 4.9 10*3/uL (ref 4.0–10.5)
nRBC: 0 % (ref 0.0–0.2)

## 2020-11-11 LAB — COMPREHENSIVE METABOLIC PANEL
ALT: 41 U/L (ref 0–44)
AST: 54 U/L — ABNORMAL HIGH (ref 15–41)
Albumin: 1.7 g/dL — ABNORMAL LOW (ref 3.5–5.0)
Alkaline Phosphatase: 58 U/L (ref 38–126)
Anion gap: 4 — ABNORMAL LOW (ref 5–15)
BUN: 9 mg/dL (ref 8–23)
CO2: 28 mmol/L (ref 22–32)
Calcium: 8.6 mg/dL — ABNORMAL LOW (ref 8.9–10.3)
Chloride: 104 mmol/L (ref 98–111)
Creatinine, Ser: 1.1 mg/dL (ref 0.61–1.24)
GFR, Estimated: >60 mL/min (ref >60)
Glucose, Bld: 104 mg/dL — ABNORMAL HIGH (ref 70–99)
Potassium: 3.6 mmol/L (ref 3.5–5.1)
Sodium: 136 mmol/L (ref 135–145)
Total Bilirubin: 0.8 mg/dL (ref 0.3–1.2)
Total Protein: 5.3 g/dL — ABNORMAL LOW (ref 6.5–8.1)

## 2020-11-11 LAB — PHOSPHORUS: Phosphorus: 1.9 mg/dL — ABNORMAL LOW (ref 2.5–4.6)

## 2020-11-11 LAB — MAGNESIUM: Magnesium: 2.1 mg/dL (ref 1.7–2.4)

## 2020-11-11 LAB — AFP TUMOR MARKER: AFP, Serum, Tumor Marker: 1.2 ng/mL (ref 0.0–8.4)

## 2020-11-11 MED ORDER — METOCLOPRAMIDE HCL 5 MG/ML IJ SOLN
10.0000 mg | Freq: Once | INTRAMUSCULAR | Status: AC
  Administered 2020-11-12: 10 mg via INTRAVENOUS
  Filled 2020-11-11: qty 2

## 2020-11-11 MED ORDER — POTASSIUM PHOSPHATES 15 MMOLE/5ML IV SOLN
20.0000 mmol | Freq: Once | INTRAVENOUS | Status: AC
  Administered 2020-11-11: 20 mmol via INTRAVENOUS
  Filled 2020-11-11: qty 6.67

## 2020-11-11 MED ORDER — PEG-KCL-NACL-NASULF-NA ASC-C 100 G PO SOLR
0.5000 | Freq: Once | ORAL | Status: AC
  Administered 2020-11-12: 100 g via ORAL

## 2020-11-11 MED ORDER — PEG-KCL-NACL-NASULF-NA ASC-C 100 G PO SOLR: 1.0000 | Freq: Once | ORAL | Status: DC

## 2020-11-11 MED ORDER — PEG-KCL-NACL-NASULF-NA ASC-C 100 G PO SOLR
0.5000 | Freq: Once | ORAL | Status: AC
  Administered 2020-11-11: 100 g via ORAL
  Filled 2020-11-11 (×2): qty 1

## 2020-11-11 MED ORDER — METOCLOPRAMIDE HCL 5 MG/ML IJ SOLN
10.0000 mg | Freq: Once | INTRAMUSCULAR | Status: AC
  Administered 2020-11-11: 10 mg via INTRAVENOUS
  Filled 2020-11-11: qty 2

## 2020-11-11 MED ORDER — BISACODYL 5 MG PO TBEC
20.0000 mg | DELAYED_RELEASE_TABLET | Freq: Once | ORAL | Status: AC
  Administered 2020-11-11: 20 mg via ORAL
  Filled 2020-11-11: qty 4

## 2020-11-11 NOTE — Progress Notes (Signed)
Subjective:   No acute events were reported overnight. This morning, [name] states he feels "the same" as he had prior to admission with persistent generalized weakness, fatigue, and light-headedness. He says he's had significant shortness of breath with exertion over the past 1 week, although his history if variable. He says he was able to eat "all his breakfast this morning" although says all this was was soup (and he was put on clear liquid diet last night). He says he's had increased stool frequency here in the hospital following constipation at home, although denies any dark or bloody stools, says they're brown. Says chills are improving and denies N/V, CP, palpitations, or any other symptoms.   Objective:  Vital signs in last 24 hours: Vitals:   11/10/20 2035 11/11/20 0443 11/11/20 0500 11/11/20 0911  BP: 110/65 106/64  94/62  Pulse: 63 (!) 58  (!) 57  Resp: 18 19  18   Temp: 98.3 F (36.8 C) 98.2 F (36.8 C)  98.2 F (36.8 C)  TempSrc: Oral Oral  Oral  SpO2: 96% 97%  98%  Weight:   63.8 kg   Height:       General: Patient is thin and appears chronically ill, although resting comfortably in bed. Eyes: Mild proptosis, No scleral icterus.  HENT: MMM. Poor dentition. Respiratory: Lungs are CTA, anteriorly. No wheezes, rales, or rhonchi. No increased work of breathing on room air.  Cardiovascular: Rate is bradycardic. Rhythm is irregularly irregular. No audible m/r/g although heart sounds distant.  Neurological: Awake and alert, and following commands.   MSK: Patient has severe, diffuse cachexia and generalized weakness. Abdominal: Scaphoid, without tenderness to palpation or palpable mass aside from aorta. No guarding or rebound. Bowel sounds intact throughout.  Psych: Pleasant and cooperative.  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 Although he initially presented hypotensive, this was more likely in the setting of his medications and poor PO intake / weight loss rather than overt sepsis, especially as he was afebrile without WBC at that time. He continues to have no fevers or leukocytosis. ID consulted. Anticipate source is most likely urinary vs. 2/2 gut translocation. His chronic weight loss is most likely associated with underlying malignancy although may be due to emphysema vs. Possible underlying cirrhosis w/ possible early dementia (see below for details). His fatigue and weakness seem to have prevented him from seeking out food and water at home.  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 Cirrhosis. Hgb has continued to drop from >11 on admission to 8.6. He denies any  dark or bloody stools although has had increased stool frequency, concerning for GI source of bleed. It is possible that he also has hemoconcentration with underlying pancytopenia in the setting of cirrhosis vs. Underlying BM process that is now  showing with volume repletion. However, he did have normal hemoglobin ~14 in previous few years.  - Although vascular recommended therapeutic anticoagulation for PV, do appreciate heme/onc's input to hold off on anticoagulation with active concern for GI bleeding - Discontinued IV heparin and holding DVT prophylaxis and low dose ASA due to down-trending Hgb and upcoming endoscopies  - Continue to monitor daily CBC's  - Transfuse for Hgb < 8   # Possible Pancreatic Tumor(s)  # Hx of Lung Cancer  Patient has a history of right upper lobectomy due to lung cancer, localized to the lung, several years ago. Repeat CT chest 08/2020 did not show any evidence of recurrence or other concerning lung lesions. Lipase was elevated to 65 on admission. CT Abdomen Pelvis showed an ill-defined hypovascular area over the head and uncinate process of the pancreas with some calcification, concerning for malignancy and possible chronic pancreatitis. Follow-up MRI / MRCP was significantly motion-degraded, although did not show any discrete pancreatic neoplasm. There were multiple tiny cystic areas (37mm max) consistent with side branch ectasia and/or multiple side branch IPMN's. CA 19-9 was slightly elevated at 113. Oncology evaluated the patient and do not believe that he has active pancreatic or current lung cancer (although cannot prove this without a biopsy of course).  - Oncology recommends watching if upper and lower endoscopy are unremarkable, suggesting laparoscopic exploration may be a possibility although would be highly aggressive  - Oncology feel portal vein thrombosis is likely more chronic and suggest holding off on oral anticoagulation due to fear of bleeding  - Oncology continue to follow, appreciate their recommendations   # Variable Conduction Delay w/ PVC/PAC's and Bradycardia # HFpEF (Hx HFmrEF) w/ G1DD  ECHO this admission showed LVH and G1DD but no obvious structural heart abnormalities, although ECHO  had poor windows. Cardiology were consulted for clearance for endoscopies tomorrow in the setting of Mobitz Type 1 Second Degree AV block with bradycardia noted here on admission with progression since previous EKG's in the past. He had been on high dose Toprol at home.  - Discontinued Metoprolol and will continue holding AV nodal blocking agents  - Continuous telemetry monitoring  - Keep K 4-5 and Mag >2  # Triple Vessel CAD # NSTEMI Type II  Troponins were slightly elevated although flat on arrival. May be in setting of hypotension and active infection. Cardiology were consulted, and despite his heart block, suggest his presentation is not consistent with ACS. Recommend medical management with continuation of statin and resumption of ASA when appropriate. Last lipid panel was in 2020.  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 Phosphorous dropped s/p replacement although overall has improved since arrival. Likely prerenal with possible refeeding syndrome.  - Given 46mmol K-Phos IVPB - Given 2g IV Mg Sulfate  - Monitor renal function and electrolytes daily  - Continue to monitor need for IVF w/ variable diets   # Elevated AST, Persistent   # Mixed Hyperbilirubinemia, Resolved   # AUD Patient continues to have  a benign abdominal exam without RUQ TTP or Murphy's sign. His ALT and bilirubin have normalized although AST remains slightly elevated. Albumin remains low with borderline pancytopenia and fibrosis stage F3-F4, concerning for underlying cirrhosis (although there was not clear evidence of cirrhosis on imaging outside of hepatic steatosis).  - Will d/c CIWA tomorrow if he does not show signs of withdrawal given normal scores - Continue  to monitor CMP   # Hx of HTN # Intermittent Hypotension  Blood pressures have been very soft during admission, likely due to reduced PO intake, metoprolol, bacteremia and heart block.   - Discontinued Lopressor  - Continue to hold home medications  - Close monitoring   # Chronic Pain  Patient takes Norco PRN at home for pain.  - Norco 5-325mg  twice daily PRN for severe pain   # Gout Patient notes he avoids seafoods and certain foods due to gout flares. - Continue home allopurinol   Code Status: Full Code  Diet: Regular Fluids: None  DVT PPx: NONE  Prior to Admission Living Arrangement: Home alone  Anticipated Discharge Location:SNF Barriers to Discharge: Continued workup   Jeralyn Bennett, MD 11/11/2020, 3:49 PM Pager: [phone] After 5pm on weekdays and 1pm on weekends: On Call pager [phone]

## 2020-11-11 NOTE — Progress Notes (Addendum)
Daily Rounding Note  11/11/2020, 10:36 AM  LOS: 4 days   SUBJECTIVE:   Chief complaint:  Weight loss.  Hx colon adenomas.  Chronic pancreatitis.    Feeling okay.  A little bit tired.  No shortness of breath.  No nausea, vomiting, abdominal pain.  2 bowel movements yesterday.  OBJECTIVE:         Vital signs in last 24 hours:    Temp:  [98.2 F (36.8 C)-98.6 F (37 C)] 98.2 F (36.8 C) (05/28 0911) Pulse Rate:  [57-65] 57 (05/28 0911) Resp:  [16-19] 18 (05/28 0911) BP: (94-110)/(56-65) 94/62 (05/28 0911) SpO2:  [96 %-99 %] 98 % (05/28 0911) Weight:  [63.8 kg] 63.8 kg (05/28 0500) Last BM Date: 11/10/20 Filed Weights   11/09/20 0457 11/10/20 0700 11/11/20 0500  Weight: 71.2 kg 62.8 kg 63.8 kg   General: Looks a bit frail and chronically ill but comfortable and in no distress. Heart: RRR. Chest: Clear bilaterally.  No labored breathing.  No cough Abdomen: Soft without tenderness.  No distention.  Active bowel sounds.  No palpable masses. Extremities: No CCE. Neuro/Psych: Pleasant, calm, cooperative, fluid speech.  Moves all 4 limbs without gross tremor or weakness.  Intake/Output from previous day: 05/27 0701 - 05/28 0700 In: 2025 [P.O.:837; I.V.:118.8; IV Piggyback:480.2] Out: 1600 [Urine:1600]  Intake/Output this shift: Total I/O In: 240 [P.O.:240] Out: -   Lab Results: Recent Labs    11/09/20 0316 11/10/20 0342 11/11/20 0231  WBC 7.5 5.4 4.9  HGB 9.5* 9.2* 8.6*  HCT 28.5* 27.5* 26.0*  PLT 132* 150 128*   BMET Recent Labs    11/09/20 0316 11/10/20 0621 11/11/20 0231  NA 136 138 136  K 3.7 4.0 3.6  CL 104 106 104  CO2 29 27 28   GLUCOSE 94 93 104*  BUN 13 11 9   CREATININE 1.12 1.09 1.10  CALCIUM 8.8* 8.7* 8.6*   LFT Recent Labs    11/09/20 0316 11/10/20 0621 11/11/20 0231  PROT 5.1* 5.1* 5.3*  ALBUMIN 1.7* 1.7* 1.7*  AST 53* 48* 54*  ALT 41 38 41  ALKPHOS 76 55 58  BILITOT 0.9 0.5  0.8   PT/INR No results for input(s): LABPROT, INR in the last 72 hours. Hepatitis Panel No results for input(s): HEPBSAG, HCVAB, HEPAIGM, HEPBIGM in the last 72 hours.  Studies/Results: No results found.  ASSESMENT:   *  Long term weight loss.  Overall reduced p.o. intake without nausea or vomiting. Severe PCM.    *   Non-occlusive PVT.  Oncologist, Dr. Benay Spice consulted and stated "I am not sure I would entertain any oral anticoagulation with him.  I would worry about something happening to him and he have a bleed."  *    E. coli bacteremia.  Completed Cefepime, now on Bactrim per ID.    *   Pancreatic cystic lesions/IPMNs, pancreatitic calcification of head/uncinate per imaging.  No abdominal pain.  *   Hx attenuated FAP (famillial adenomatous polyposis).  Adenomatous polyps on colonoscopy 03/2019.    *   Macrocytic anemia.  Low iron, low TIBC, elevated ferritin, elevated B12..  Normal folate.  Suspect anemia of chronic disease.  *    hypophosphatemia with normal mag and potassium levels.  *    alcohol abuse.  Minor elevation AST, hypoalbuminemia but otherwise normal LFTs and no evidence cirrhosis on CT or MRI.  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  Right common iliac aneurysm.   PLAN   *   EGD and colonoscopy tmrw.  Patient understands risks and benefits and is agreeable to proceed.    Azucena Freed  11/11/2020, 10:36 AM Phone [phone]  I have discussed the case with the APP, and that is the plan I formulated. I personally interviewed and examined the patient.  CC: Weight loss  No clinical change with regard to the patient's weight loss and digestive symptoms  The medical team message me this morning with concerns about the patient's bradycardia.  Cardiology consult was obtained, and I reviewed Dr. Jacalyn Lefevre note.  Mobitz type I second-degree AV block, reportedly stable for years.  Beta-blocker doses have been progressively decreased throughout this  hospitalization and now been discontinued.  It is not felt that the patient had acute coronary syndrome this admission  Given that, patient appears to be acceptable risk to proceed with sedation for EGD and colonoscopy as planned.  He has limited health literacy and frankly seems to be not terribly motivated to do the bowel preparation this evening.  I did my best to stress the importance of that, I spoke to his nurse to make sure he gets up in pharmacy soon, I hope he will do it.  If patient takes sufficient bowel preparation, plan is for EGD and colonoscopy tomorrow.  His heparin drip was stopped earlier today by the medical team  Nelida Meuse III Office: [phone]

## 2020-11-11 NOTE — Consult Note (Addendum)
Cardiology Consultation:   Patient ID: DIONE MCCOMBIE MRN: [id number]; DOB: 05-06-1946  Admit date: 11/06/2020 Date of Consult: 11/11/2020  PCP:  Axel Filler, MD   [facility] Providers Cardiologist:  None        Patient Profile:   YIANNI SKILLING is a 75 y.o. 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 Marva Panda.  History of Present Illness:   Mr. Luallen had a remote cath in 2004 for NSTEMI resulting in DES to prox RCA and staged PCI to mid LCx. There was moderate residual disease in the distal LCx. LM, LAD were OK, EF 60% at that time. saw Dr. Harrington Challenger in the office once in 2016 for follow-up of CAD. He had NSIVCD on EKG at that time with occasional PACs. Subsequent tracings during admissions for other issues show some degree of conduction abnormality with episodic bradycardia (see 2017, 2020). 2020 tracing showed RBBB/LAFB pattern.  He was admitted 11/06/2020 with progressive weakness, poor appetite weight loss, and constipation. 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 hsTroponin low/flat [phone] and echo reassuring with normal LVEF. He is being concomitantly  followed by heme-onc, GI, infectious disease, and vascular surgery. He was on heparin for PVT - anticoagulation recommended by vascular surgery, but this appears to have been later stopped as he was stopped as he was felt to be poor candidate for anticoagulation. GI plans endoscopy/colonoscopy tomorrow.  He was noted to be intermittently bradycardic on telemetry prompting cardiology consultation. Lowest HR 39-mid 40s bpm. At home he was on Toprol 151m daily. This has been reduced over the course of this admission to Lopressor 287mBID then 12.51m44mID starting 5/24 (last dose this AM). TSH wnl, last K 3.6. He denies any chest pain, dizziness, palpitations, near-syncope or syncope. He denies any SOB but this was noted in some of his admission documentation. His biggest frustration currently is relaying how difficult it was to arrange transportation the day he came into the hospital - was trying to get to an OP doctor's appt. EKG requested. This is pending at this time.  Past Medical History:  Diagnosis Date  . [place]dominal aortic aneurysm) [place]  followed by vasc surgery  . Alcohol abuse   . Barretts esophagus    bx distal esophagus 2011 neg   . Bladder outlet obstruction 04/02/2018  . CAD in native artery    a. s/p stent to prox RCA and mid Cx.  . Cataract   . Cholelithiasis   . Chronic osteomyelitis involving ankle and foot, right ([facility]/27/[place]  . 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   2006 - 3 adenomas largest 12 mm  2008 - no polyps 2013-  2 mm cecal polyp, adenoma, routine repeat colonoscopy 02/2017 approx 05/14/2017 18 polyps max 12 mm ALL adenomas recall 2019 -  CarGatha MayerD, [facility]AC[facility]  .  Diverticulosis of colon   . DJD (degenerative joint disease)    left hip  . Full dentures    upper and lower  . GERD (gastroesophageal reflux disease)    diet controlled   . Gout    feet  . Hiatal hernia    2 cm noted on upper endos 2011   . Hx of adenomatous colonic polyps   .  Hyperlipidemia   . Hypertension   . Internal hemorrhoid   . 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 Myocardial infarction [facility]) 2004   hx of, 2 stents 2004. pt was on Plavix x 3 years then transitioned to ASA, no problems since  . Non-small cell carcinoma of right lung, stage 1 ([place]) 01/30/2016  . Renal failure, acute (HCC)    hx of 2/2 medication, resolved  . Tobacco abuse    smoker .5 ppd x 55 yrs  . UTI (lower urinary tract infection)     Past Surgical History:  Procedure Laterality Date  . cataract surgery Bilateral 2018   removal of cataracts  . COLONOSCOPY     hx polyps - [name] 02/2012  . CORONARY ANGIOPLASTY WITH STENT PLACEMENT    . flexible cystourethroscopy  05/24/06  . INGUINAL HERNIA REPAIR  2005  . LUNG LOBECTOMY Right 11/07   RUL  . MULTIPLE TOOTH EXTRACTIONS    . UPPER GASTROINTESTINAL ENDOSCOPY       Home Medications:  Prior to Admission medications   Medication Sig Start Date End Date Taking? Authorizing Provider  allopurinol (ZYLOPRIM) 100 MG tablet Take 1 tablet (100 mg total) by mouth at bedtime. 11/08/19  Yes Axel Filler, MD  amLODipine (NORVASC) 10 MG tablet Take 1 tablet (10 mg total) by mouth daily. 10/31/20  Yes Axel Filler, MD  aspirin EC 81 MG tablet Take 81 mg by mouth at bedtime.    Yes [provider]  HYDROcodone-acetaminophen (NORCO) 7.5-325 MG tablet Take 1 tablet by mouth 2 (two) times daily as needed for moderate pain. Patient taking differently: Take 1 tablet by mouth in the morning and at bedtime. 08/14/20  Yes Axel Filler, MD  metoprolol succinate (TOPROL-XL) 100 MG 24 hr tablet Take 1 tablet (100 mg total) by mouth daily. Take with or immediately following a meal. Patient taking differently: Take 100 mg by mouth at bedtime. 10/31/20  Yes Axel Filler, MD  Multiple Vitamin (MULTIVITAMIN WITH MINERALS) TABS tablet Take 1 tablet by mouth daily.    Yes [provider]  rosuvastatin (CRESTOR) 20 MG tablet TAKE 1 TABLET EVERY DAY Patient taking differently: Take 20 mg by mouth at bedtime. 08/28/20  Yes Axel Filler, MD  tamsulosin (FLOMAX) 0.4 MG CAPS capsule TAKE 2 CAPSULES (0.8 MG TOTAL) BY MOUTH EVERY EVENING. Patient taking differently: Take 0.8 mg by mouth at bedtime. 08/28/20  Yes Axel Filler, MD  albuterol (PROVENTIL HFA;VENTOLIN HFA) 108 (90 Base) MCG/ACT inhaler Inhale 2 puffs into the lungs every 6 (six) hours as needed for wheezing or shortness of breath. Patient not taking: No sig reported 06/02/17   Axel Filler, MD  docusate sodium (COLACE) 100 MG capsule Take 1 capsule (100 mg total) by mouth 2 (two) times daily as needed for mild constipation. Patient not taking: No sig reported 07/25/18   Street, [place], PA-C  OVER THE COUNTER MEDICATION Take 1 tablet by mouth 2 (two) times daily as needed (pain). Pain medication that dissolves  like alka-seltzer - from [facility] Patient not taking: Reported on 11/06/2020    [provider]  polyethylene glycol (MIRALAX / GLYCOLAX) packet Take 17 g by mouth daily. Take daily until you achieve soft daily bowel movements, then titrate dosing to continue having daily soft bowel movements Patient not taking: No sig reported 07/25/18   Street, [place], [place]    Inpatient Medications: Scheduled Meds: . allopurinol  100 mg Oral QHS  . Chlorhexidine Gluconate Cloth  6 each Topical Q0600  . feeding supplement  237 mL Oral TID BM  . metoCLOPramide (REGLAN) injection  10 mg Intravenous Once   Followed by  . metoCLOPramide (REGLAN) injection  10 mg Intravenous Once  . multivitamin with minerals  1 tablet Oral Daily  . peg 3350 powder  0.5 kit Oral Once   And  . peg 3350 powder  0.5 kit Oral Once  . ramelteon  8 mg Oral QHS  . rosuvastatin  20 mg Oral Daily  . sodium chloride flush  3 mL Intravenous Q12H  . sulfamethoxazole-trimethoprim  1 tablet Oral Q12H   . tamsulosin  0.8 mg Oral QPM   Continuous Infusions: . sodium chloride Stopped (11/07/20 2300)  . potassium PHOSPHATE IVPB (in mmol) 20 mmol (11/11/20 0825)   PRN Meds: sodium chloride, acetaminophen, HYDROcodone-acetaminophen, ondansetron (ZOFRAN) IV  Allergies:    Allergies  Allergen Reactions  . Candesartan Cilexetil-Hctz Other (See Comments)    Acute renal failure  . Hydrochlorothiazide Other (See Comments)    Acute renal failure  . Shellfish Allergy Other (See Comments)    Gout occurs    Social History:   Social History   Socioeconomic History  . Marital status: Single    Spouse name: Not on file  . Number of children: 0  . Years of education: Not on file  . Highest education level: Not on file  Occupational History  . Not on file  Tobacco Use  . Smoking status: Current Every Day Smoker    Packs/day: 0.25    Years: 55.00    Pack years: 13.75    Types: Cigarettes  . Smokeless tobacco: Never Used  Vaping Use  . Vaping Use: Never used  Substance and Sexual Activity  . Alcohol use: Yes    Alcohol/week: 0.0 standard drinks    Comment: drinking 1-2 pts gin/week, (mixed with tap water not tonic)  . Drug use: No  . Sexual activity: Not on file  Other Topics Concern  . Not on file  Social History Narrative  . Not on file   Social Determinants of Health   Financial Resource Strain: Not on file  Food Insecurity: Not on file  Transportation Needs: Not on file  Physical Activity: Not on file  Stress: Not on file  Social Connections: Not on file  Intimate Partner Violence: Not on file    Family History:    Family History  Problem Relation Age of Onset  . Diabetes Mother   . Hypertension Sister   . Heart disease Sister   . Diabetes Sister   . Arthritis Brother   . Gout Brother   . Colon cancer Neg Hx   . Esophageal cancer Neg Hx   . Rectal cancer Neg Hx   . Stomach cancer Neg Hx   . Colon polyps Neg Hx      ROS:  Please see the history of  present illness.   All other ROS reviewed and negative.     Physical Exam/Data:   Vitals:  11/10/20 2035 11/11/20 0443 11/11/20 0500 11/11/20 0911  BP: 110/65 106/64  94/62  Pulse: 63 (!) 58  (!) 57  Resp: _0 Temp: 98.3 F (36.8 C) 98.2 F (36.8 C)  98.2 F (36.8 C)  TempSrc: Oral Oral  Oral  SpO2: 96% 97%  98%  Weight:   63.8 kg   Height:        Intake/Output Summary (Last 24 hours) at 11/11/2020 1213 Last data filed at 11/11/2020 1109 Gross per 24 hour  Intake 1028.67 ml  Output 2600 ml  Net -1571.33 ml   Last 3 Weights 11/11/2020 11/10/2020 11/09/2020  Weight (lbs) 140 lb 10.5 oz 138 lb 7.2 oz 156 lb 15.5 oz  Weight (kg) 63.8 kg 62.8 kg 71.2 kg     Body mass index is 18.56 kg/m.  General: Cachectic appearing AAM in no acute distress. Head: Normocephalic, atraumatic, sclera non-icteric, no xanthomas, nares are without discharge. Neck: Negative for carotid bruits. JVP not elevated. Lungs: Clear bilaterally to auscultation without wheezes, rales, or rhonchi. Breathing is unlabored. Heart: Irregular, rate controlled, S1 S2 without murmurs, rubs, or gallops.  Abdomen: Soft, non-tender, non-distended with normoactive bowel sounds. No rebound/guarding. Extremities: No clubbing or cyanosis. No edema. Distal pedal pulses are 2+ and equal bilaterally. Neuro: Alert and oriented X 3. Moves all extremities spontaneously. Psych:  Responds to questions appropriately with a normal affect.   EKG:  The EKG was personally reviewed and demonstrates Multiple tracings reviewed First tracing appears to show NSR with 2nd degree AVB type 1 with LAFB, possible prior anteroseptal infarct Subsequent tracing shows WAP  Telemetry:  Telemetry was personally reviewed see below for further discussion.  Relevant CV Studies: Cath 04/2003 #1 RESULTS:  1. Left main coronary artery:  The left main coronary was free of     significant disease.  2. Left anterior descending artery:  The left  anterior descending gave rise     to three septal perforators and three diagonal branches.  This vessel was     irregular, but there was no major obstruction.  3. Left circumflex artery:  The left circumflex gave rise to a small     intermediate branch and two large posterolateral branches.  There was 80%     narrowing in the mid-circumflex artery.  There was 50% narrowing in the     distal circumflex artery before the posterolateral branches.  4. Right coronary artery:  The right coronary artery had an anterior and     inferior-directed takeoff and had several sharp bends in its proximal     portion with diffuse disease with focal narrowing of 90%.  There were a     conus and atrial branch that arose from the lesions.  The right coronary     artery then gave rise to a right ventricular branch, a posterior     descending branch, and two small posterolateral branches.  The distal     vessel was irregular but free of major obstruction.  5. Left ventriculogram:  The left ventriculogram performed in RAO projection     showed mild inferior wall hypokinesis.  The estimated ejection fraction     was 60%.  The aortic pressure was 148/17, left ventricular pressure was     148/80.  6. Following stenting of the lesion in the proximal right coronary artery,     the stenosis improved from 90% to less than 10%.   CONCLUSION:  1. Coronary artery disease, status post  recent non-ST segment elevation     myocardial infarction with irregularities in the left anterior descending     artery, 80% mid- and 50% distal stenosis in the circumflex artery, 90%     stenosis in the proximal right coronary artery, with mild inferior wall     hypokinesis and an ejection fraction estimated at 60%.  2. Successful stenting of the proximal right coronary artery with a bare     metal stent with improvement in sentinel narrowing from 90% to less than     10%.   DISPOSITION:  The patient was returned to the  post-angioplasty unit for  further observation.  Will plan to intervene on the circumflex artery in two  days on Wednesday.                                               Eustace Quail, M.D.  Cath 04/2003 #2   CARDIAC CATHETERIZATION   INDICATIONS FOR PROCEDURE:  Mr. Schoneman is 75 years old and was admitted to  the hospital with chest pain and enzymes consistent with a non-ST elevation  myocardial infarction.  He underwent a difficult intervention two days ago  of a tight lesion in the right coronary artery with a bare metal stent and  was brought back today for intervention on the circumflex artery.   PROCEDURE:  The procedure was performed via the left femoral artery using  arterial sheath and a CLS-4 6 [place] guiding catheter with side holes.  We  used a PT2 wire light support.  We crossed the lesion with the wire without  difficulty.  We predilated with a 3.0 x 20-mm Maverick performing one  inflation up to 6 atmospheres for 30 seconds.  We then passed a 3.0 x 24-mm  Taxus stent down to the lesion.  We had some difficulty advancing the stent  down to the lesion because of marked tortuosity in the proximal vessel.  However, we were able to accomplish this with less difficulty than  anticipated.  We deployed the stent with one inflation of  12 atmospheres  for 30 seconds.  We then post dilated with a 3.25 x 20-mm Quantum Maverick  performing one inflation up to 15 atmospheres for 30 seconds.  Repeat  diagnostic study was then performed through the guiding catheter.  The  patient tolerated the procedure well and left the laboratory in satisfactory  condition.   RESULTS:  Initially, the stenosis in the mid circumflex artery was estimated  at 80%.  Following stenting, this improved to less than 10%.   CONCLUSIONS:  Successful stenting of the lesion in the mid circumflex artery  with a Taxus stent with improvement in stent renarrowing from 80% to less  than 10%.    DISPOSITION:  The patient returned to the postangioplasty unit for further  observation.   He will need Plavix for six months and aspirin indefinitely.  2D echo 11/06/20 1. Left ventricular ejection fraction, by estimation, is 55 to 60%. The  left ventricle has normal function. Left ventricular endocardial border  not optimally defined to evaluate regional wall motion. There is mild left  ventricular hypertrophy. Left  ventricular diastolic parameters are consistent with Grade I diastolic  dysfunction (impaired relaxation).  2. Right ventricular systolic function was not well visualized. The right  ventricular size is not well visualized. There is normal pulmonary  artery  systolic pressure. The estimated right ventricular systolic pressure is  57.2 mmHg.  3. The mitral valve is normal in structure. No evidence of mitral valve  regurgitation. No evidence of mitral stenosis.  4. The aortic valve is tricuspid. Aortic valve regurgitation is not  visualized. No aortic stenosis is present.  5. The inferior vena cava is normal in size with greater than 50%  respiratory variability, suggesting right atrial pressure of 3 mmHg.  6. Technically difficult study with poor acoustic windows.      Laboratory Data:  High Sensitivity Troponin:   Recent Labs  Lab 11/06/20 1328 11/06/20 1600 11/06/20 1844  TROPONINIHS 66* 69* 69*     Chemistry Recent Labs  Lab 11/09/20 0316 11/10/20 0621 11/11/20 0231  NA 136 138 136  K 3.7 4.0 3.6  CL 104 106 104  CO2 _0 GLUCOSE 94 93 104*  BUN _1 CREATININE 1.12 1.09 1.10  CALCIUM 8.8* 8.7* 8.6*  GFRNONAA >60 >60 >60  ANIONGAP 3* 5 4*    Recent Labs  Lab 11/09/20 0316 11/10/20 0621 11/11/20 0231  PROT 5.1* 5.1* 5.3*  ALBUMIN 1.7* 1.7* 1.7*  AST 53* 48* 54*  ALT 41 38 41  ALKPHOS 76 55 58  BILITOT 0.9 0.5 0.8   Hematology Recent Labs  Lab 11/09/20 0316 11/10/20 0342 11/11/20 0231  WBC 7.5 5.4 4.9  RBC 2.81*  2.71* 2.54*  HGB 9.5* 9.2* 8.6*  HCT 28.5* 27.5* 26.0*  MCV 101.4* 101.5* 102.4*  MCH 33.8 33.9 33.9  MCHC 33.3 33.5 33.1  RDW 13.4 13.6 13.6  PLT 132* 150 128*   BNPNo results for input(s): BNP, PROBNP in the last 168 hours.  DDimer No results for input(s): DDIMER in the last 168 hours.   Radiology/Studies:  CT ABDOMEN PELVIS W CONTRAST  Result Date: 11/07/2020 CLINICAL DATA:  75 year old male with history of anemia and unintended weight loss. EXAM: CT ABDOMEN AND PELVIS WITH CONTRAST TECHNIQUE: Multidetector CT imaging of the abdomen and pelvis was performed using the standard protocol following bolus administration of intravenous contrast. CONTRAST:  146m OMNIPAQUE IOHEXOL 300 MG/ML  SOLN COMPARISON:  04/02/2018.  CT the abdomen and pelvis FINDINGS: Lower chest: Atherosclerotic calcifications in the thoracic aorta as well as the left main, left anterior descending, left circumflex and right coronary arteries. Hepatobiliary: No suspicious cystic or solid hepatic lesions. No intra or extrahepatic biliary ductal dilatation. Partially calcified gallstone measuring 1.5 cm in diameter in the neck of the gallbladder which is nearly decompressed. No pericholecystic fluid or inflammatory changes. Pancreas: Ill-defined hypovascular area in the head and uncinate process of the pancreas (axial image 40 of series 3), concerning for potential pancreatic mass. There are some calcifications in this region as well. Body and tail of the pancreas are otherwise normal in appearance. No pancreatic ductal dilatation. No peripancreatic fluid collections or inflammatory changes. Spleen: Unremarkable. Adrenals/Urinary Tract: Multiple low-attenuation lesions in the kidneys bilaterally, compatible with simple cysts, largest of which is in the upper pole of the right kidney measuring 4.6 cm in diameter. Other subcentimeter low-attenuation lesions in both kidneys are too small to definitively characterize, but are favored to  represent tiny cysts. Bilateral adrenal glands are normal in appearance. No hydroureteronephrosis. Urinary bladder is nearly decompressed around an indwelling Foley balloon catheter, but is otherwise unremarkable in appearance. Stomach/Bowel: The appearance of the stomach is normal. There is no pathologic dilatation of small bowel or colon. Numerous colonic diverticulae are noted, without  surrounding inflammatory changes to suggest an acute diverticulitis at this time. Normal appendix. Vascular/Lymphatic: Aortic atherosclerosis with fusiform aneurysmal dilatation of the infrarenal abdominal aorta which measures up to 5.3 x 5.2 cm in diameter. There is also aneurysmal dilatation of the right common iliac artery which measures 2.0 cm in diameter. Small penetrating ulcer noted at the level of the aortic hiatus (axial image 17 of series 3). Small filling defect in the proximal portal vein (axial image 32 of series 3) compatible with thrombus. No lymphadenopathy noted in the abdomen or pelvis. Reproductive: Prostate gland and seminal vesicles are unremarkable in appearance. Other: No significant volume of ascites.  No pneumoperitoneum. Musculoskeletal: There are no aggressive appearing lytic or blastic lesions noted in the visualized portions of the skeleton. IMPRESSION: 1. Ill-defined hypovascular area in the head of the pancreas concerning for potential pancreatic neoplasm. Further evaluation with nonemergent abdominal MRI with and without IV gadolinium with MRCP is recommended in the near future to better evaluate this finding. 2. 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 3. Cholelithiasis without evidence of acute cholecystitis at this time. 4. Aortic atherosclerosis, in addition to left main and 3 vessel coronary artery disease. In addition, there is a small penetrating ulcer of the anterior aspect of the distal descending thoracic aorta at the level of the aortic hiatus, fusiform aneurysmal dilatation of  the infrarenal abdominal aorta which measures up to 5.3 x 5.2 cm in diameter, and aneurysmal dilatation of the right common iliac artery which measures 2.0 cm in diameter. Recommend follow-up CT/MR every 6 months and vascular consultation. This recommendation follows ACR consensus guidelines: White Paper of the ACR Incidental Findings Committee II on Vascular Findings. J Am Coll Radiol 2013; 10:789-794. 5. Colonic diverticulosis without evidence of acute diverticulitis at this time. 6. Additional incidental findings, as above. Electronically Signed   By: Vinnie Langton M.D.   On: 11/07/2020 20:18   MR 3D Recon At Scanner  Result Date: 11/08/2020 CLINICAL DATA:  75 year old male with history of possible pancreatic head mass noted on prior CT examination. Follow-up study. EXAM: MRI ABDOMEN WITHOUT AND WITH CONTRAST (INCLUDING MRCP) TECHNIQUE: Multiplanar multisequence MR imaging of the abdomen was performed both before and after the administration of intravenous contrast. Heavily T2-weighted images of the biliary and pancreatic ducts were obtained, and three-dimensional MRCP images were rendered by post processing. CONTRAST:  66m GADAVIST GADOBUTROL 1 MMOL/ML IV SOLN COMPARISON:  No prior abdominal MRI. CT the abdomen and pelvis 11/07/2020. FINDINGS: Comment: Today's study is limited by large amount of patient motion artifact. Lower chest: Unremarkable. Hepatobiliary: No discrete cystic or solid hepatic lesions are confidently identified on today's motion limited examination. No intra or extrahepatic biliary ductal dilatation noted on MRCP images. Common bile duct measures 3-4 mm in the porta hepatis. No filling defects in the common bile duct to suggest choledocholithiasis. Filling defects are present in the gallbladder, compatible with cholelithiasis. Gallbladder is not distended. No gallbladder wall thickening or pericholecystic fluid or inflammatory changes. Pancreas: No discrete pancreatic mass is  confidently identified in the head of the pancreas or uncinate process. Throughout this region there are multiple tiny cystic areas largest of which measure only 6 mm and what appears to be side branch ectasia, without a well-defined larger cystic mass. No pancreatic ductal dilatation noted on MRCP images. No peripancreatic fluid collections or inflammatory changes. Spleen:  Unremarkable. Adrenals/Urinary Tract: Multiple T1 hypointense, T2 hyperintense, nonenhancing lesions are noted in both kidneys, compatible with simple cysts, largest of which  is in the medial aspect of the upper pole of the right kidney measuring 5.1 x 4.6 cm. In addition, in the posterior aspect of the upper pole the left kidney there is a 2.4 x 1.9 cm T1 hyperintense, T2 hypointense, nonenhancing lesion, compatible with a proteinaceous/hemorrhagic cyst. No hydroureteronephrosis in the visualized portions of the abdomen. Bilateral adrenal glands are normal in appearance. Stomach/Bowel: Visualized portions are unremarkable. Vascular/Lymphatic: No known infrarenal abdominal aortic aneurysm is only partially imaged, better demonstrated on yesterday's CT the abdomen and pelvis. Likewise, the filling defect in the proximal portal vein is evident on today's study, but better demonstrated on yesterday's CT examination. No lymphadenopathy noted in the abdomen. Other: No significant volume of ascites noted in the visualized portions of the peritoneal cavity. Musculoskeletal: No aggressive appearing osseous lesions are noted in the visualized portions of the skeleton. IMPRESSION: 1. No definite pancreatic mass confidently identified. 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 Recommend follow up pre and post contrast MRI/MRCP or pancreatic protocol CT in 2 years. This recommendation follows ACR consensus  guidelines: Management of Incidental Pancreatic Cysts: A White Paper of the ACR Incidental Findings Committee. J Am Coll Radiol 2017;14:911-923. 2. 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 3. Nonocclusive portal vein thrombus again noted, but better demonstrated on yesterday's CT examination. 4. Infrarenal abdominal aortic aneurysm better demonstrated on yesterday's CT examination which measured up to 5.3 x 5.2 cm in diameter. Electronically Signed   By: Vinnie Langton M.D.   On: 11/08/2020 12:26   MR ABDOMEN MRCP W WO CONTAST  Result Date: 11/08/2020 CLINICAL DATA:  75 year old male with history of possible pancreatic head mass noted on prior CT examination. Follow-up study. EXAM: MRI ABDOMEN WITHOUT AND WITH CONTRAST (INCLUDING MRCP) TECHNIQUE: Multiplanar multisequence MR imaging of the abdomen was performed both before and after the administration of intravenous contrast. Heavily T2-weighted images of the biliary and pancreatic ducts were obtained, and three-dimensional MRCP images were rendered by post processing. CONTRAST:  68m GADAVIST GADOBUTROL 1 MMOL/ML IV SOLN COMPARISON:  No prior abdominal MRI. CT the abdomen and pelvis 11/07/2020. FINDINGS: Comment: Today's study is limited by large amount of patient motion artifact. Lower chest: Unremarkable. Hepatobiliary: No discrete cystic or solid hepatic lesions are confidently identified on today's motion limited examination. No intra or extrahepatic biliary ductal dilatation noted on MRCP images. Common bile duct measures 3-4 mm in the porta hepatis. No filling defects in the common bile duct to suggest choledocholithiasis. Filling defects are present in the gallbladder, compatible with cholelithiasis. Gallbladder is not distended. No gallbladder wall thickening or pericholecystic fluid or inflammatory changes. Pancreas: No discrete pancreatic mass is confidently identified in the head of the pancreas or  uncinate process. Throughout this region there are multiple tiny cystic areas largest of which measure only 6 mm and what appears to be side branch ectasia, without a well-defined larger cystic mass. No pancreatic ductal dilatation noted on MRCP images. No peripancreatic fluid collections or inflammatory changes. Spleen:  Unremarkable. Adrenals/Urinary Tract: Multiple T1 hypointense, T2 hyperintense, nonenhancing lesions are noted in both kidneys, compatible with simple cysts, largest of which is in the medial aspect of the upper pole of the right kidney measuring 5.1 x 4.6 cm. In addition, in the posterior aspect of the upper pole the left kidney there is a 2.4  x 1.9 cm T1 hyperintense, T2 hypointense, nonenhancing lesion, compatible with a proteinaceous/hemorrhagic cyst. No hydroureteronephrosis in the visualized portions of the abdomen. Bilateral adrenal glands are normal in appearance. Stomach/Bowel: Visualized portions are unremarkable. Vascular/Lymphatic: No known infrarenal abdominal aortic aneurysm is only partially imaged, better demonstrated on yesterday's CT the abdomen and pelvis. Likewise, the filling defect in the proximal portal vein is evident on today's study, but better demonstrated on yesterday's CT examination. No lymphadenopathy noted in the abdomen. Other: No significant volume of ascites noted in the visualized portions of the peritoneal cavity. Musculoskeletal: No aggressive appearing osseous lesions are noted in the visualized portions of the skeleton. IMPRESSION: 1. No definite pancreatic mass confidently identified. 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 Recommend follow up pre and post contrast MRI/MRCP or pancreatic protocol CT in 2 years. This recommendation follows ACR consensus guidelines: Management of Incidental Pancreatic Cysts: A  White Paper of the ACR Incidental Findings Committee. J Am Coll Radiol 2017;14:911-923. 2. 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 3. Nonocclusive portal vein thrombus again noted, but better demonstrated on yesterday's CT examination. 4. Infrarenal abdominal aortic aneurysm better demonstrated on yesterday's CT examination which measured up to 5.3 x 5.2 cm in diameter. Electronically Signed   By: Vinnie Langton M.D.   On: 11/08/2020 12:26     Assessment and Plan:   1. 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 Low P wave amplitude makes assessment somewhat challenging - in light of recent worsening medical illness and bacteremia, would hope to avoid PPM unless absolutely necessary given infection risk - at present time he is hemodynamically stable and asymptomatic so no urgent intervention needed  2. CAD s/p prior MI/PCI - troponins low/flat [id number], not consistent with ACS - 2d echo 11/06/20 normal EF 55-60%, grade 1 DD, normal PASP - there was some report of dyspnea earlier in admission - patient is significantly anemic and deconditioned with several other medical reasons to feel poorly so in the absence of ACS and presence of reassuring echo, no further ischemic workup felt beneficial at this time - no longer on ASA at this time (was on as OP), consider resuming when OK with primary teams - avoid BB - would defer to GI whether he is felt safe to continue rosuvastatin in the context of the concern for liver disease mentioned  3. 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 Recommend follow-up CT/MR every 6 months and vascular consultation" - needs continued f/u with vascular surgery as OP  Remainder of medical issues per primary teams.  Risk Assessment/Risk Scores:      For questions or updates, please contact  [facility] Please consult [email or web address] for contact info under    Signed, Charlie Pitter, PA-C  11/11/2020 12:13 PM As above, patient seen and examined.  Briefly he is a 75 year old male with past medical history of coronary artery disease, abdominal aortic aneurysm, chronic stage II kidney disease, hypertension, alcohol abuse, Barrett's esophagus, history of lung cancer, osteomyelitis, diverticulosis admitted with progressive weakness and weight loss for evaluation of bradycardia.  Patient states he has lost 60 pounds over the past 2 years.  He also describes weakness and dyspnea on exertion.  No orthopnea, PND, pedal edema, chest pain or syncope.  Work-up has demonstrated Enterobacterales bacteremia, possible pancreatic tumor possible underlying cirrhosis.  He has been noted to have bradycardia and Mobitz 2 on telemetry and cardiology asked to evaluate.  Electrocardiogram shows sinus rhythm, PVC, first-degree AV block, nonconducted PACs, left anterior fascicular block.  Telemetry personally reviewed and showed sinus rhythm with occasional PVC, PAC, nonconducted PAC and Mobitz 1. 1 Mobitz 1 second-degree AV block-patient has had no symptoms including presyncope or syncope. Discontinue metoprolol and follow on telemetry.  Note recent echocardiogram showed normal LV function.  2 coronary artery disease-continue  statin.  Resume aspirin when okay with primary service.  3 abdominal aortic aneurysm-we will need follow-up with vascular surgery after discharge.  4 other issues per primary care.  Kirk Ruths, MD

## 2020-11-11 NOTE — H&P (View-Only) (Signed)
Daily Rounding Note  11/11/2020, 10:36 AM  LOS: 4 days   SUBJECTIVE:   Chief complaint:  Weight loss.  Hx colon adenomas.  Chronic pancreatitis.    Feeling okay.  A little bit tired.  No shortness of breath.  No nausea, vomiting, abdominal pain.  2 bowel movements yesterday.  OBJECTIVE:         Vital signs in last 24 hours:    Temp:  [98.2 F (36.8 C)-98.6 F (37 C)] 98.2 F (36.8 C) (05/28 0911) Pulse Rate:  [57-65] 57 (05/28 0911) Resp:  [16-19] 18 (05/28 0911) BP: (94-110)/(56-65) 94/62 (05/28 0911) SpO2:  [96 %-99 %] 98 % (05/28 0911) Weight:  [63.8 kg] 63.8 kg (05/28 0500) Last BM Date: 11/10/20 Filed Weights   11/09/20 0457 11/10/20 0700 11/11/20 0500  Weight: 71.2 kg 62.8 kg 63.8 kg   General: Looks a bit frail and chronically ill but comfortable and in no distress. Heart: RRR. Chest: Clear bilaterally.  No labored breathing.  No cough Abdomen: Soft without tenderness.  No distention.  Active bowel sounds.  No palpable masses. Extremities: No CCE. Neuro/Psych: Pleasant, calm, cooperative, fluid speech.  Moves all 4 limbs without gross tremor or weakness.  Intake/Output from previous day: 05/27 0701 - 05/28 0700 In: 5625 [P.O.:837; I.V.:118.8; IV Piggyback:480.2] Out: 1600 [Urine:1600]  Intake/Output this shift: Total I/O In: 240 [P.O.:240] Out: -   Lab Results: Recent Labs    11/09/20 0316 11/10/20 0342 11/11/20 0231  WBC 7.5 5.4 4.9  HGB 9.5* 9.2* 8.6*  HCT 28.5* 27.5* 26.0*  PLT 132* 150 128*   BMET Recent Labs    11/09/20 0316 11/10/20 0621 11/11/20 0231  NA 136 138 136  K 3.7 4.0 3.6  CL 104 106 104  CO2 29 27 28   GLUCOSE 94 93 104*  BUN 13 11 9   CREATININE 1.12 1.09 1.10  CALCIUM 8.8* 8.7* 8.6*   LFT Recent Labs    11/09/20 0316 11/10/20 0621 11/11/20 0231  PROT 5.1* 5.1* 5.3*  ALBUMIN 1.7* 1.7* 1.7*  AST 53* 48* 54*  ALT 41 38 41  ALKPHOS 76 55 58  BILITOT 0.9 0.5  0.8   PT/INR No results for input(s): LABPROT, INR in the last 72 hours. Hepatitis Panel No results for input(s): HEPBSAG, HCVAB, HEPAIGM, HEPBIGM in the last 72 hours.  Studies/Results: No results found.  ASSESMENT:   *  Long term weight loss.  Overall reduced p.o. intake without nausea or vomiting. Severe PCM.    *   Non-occlusive PVT.  Oncologist, Dr. Benay Spice consulted and stated "I am not sure I would entertain any oral anticoagulation with him.  I would worry about something happening to him and he have a bleed."  *    E. coli bacteremia.  Completed Cefepime, now on Bactrim per ID.    *   Pancreatic cystic lesions/IPMNs, pancreatitic calcification of head/uncinate per imaging.  No abdominal pain.  *   Hx attenuated FAP (famillial adenomatous polyposis).  Adenomatous polyps on colonoscopy 03/2019.    *   Macrocytic anemia.  Low iron, low TIBC, elevated ferritin, elevated B12..  Normal folate.  Suspect anemia of chronic disease.  *    hypophosphatemia with normal mag and potassium levels.  *    alcohol abuse.  Minor elevation AST, hypoalbuminemia but otherwise normal LFTs and no evidence cirrhosis on CT or MRI.  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  Right common iliac aneurysm.   PLAN   *   EGD and colonoscopy tmrw.  Patient understands risks and benefits and is agreeable to proceed.    Azucena Freed  11/11/2020, 10:36 AM Phone [phone]  I have discussed the case with the APP, and that is the plan I formulated. I personally interviewed and examined the patient.  CC: Weight loss  No clinical change with regard to the patient's weight loss and digestive symptoms  The medical team message me this morning with concerns about the patient's bradycardia.  Cardiology consult was obtained, and I reviewed Dr. Jacalyn Lefevre note.  Mobitz type I second-degree AV block, reportedly stable for years.  Beta-blocker doses have been progressively decreased throughout this  hospitalization and now been discontinued.  It is not felt that the patient had acute coronary syndrome this admission  Given that, patient appears to be acceptable risk to proceed with sedation for EGD and colonoscopy as planned.  He has limited health literacy and frankly seems to be not terribly motivated to do the bowel preparation this evening.  I did my best to stress the importance of that, I spoke to his nurse to make sure he gets up in pharmacy soon, I hope he will do it.  If patient takes sufficient bowel preparation, plan is for EGD and colonoscopy tomorrow.  His heparin drip was stopped earlier today by the medical team  Nelida Meuse III Office: [phone]

## 2020-11-11 NOTE — Plan of Care (Signed)
  Problem: Education: Goal: Knowledge of General Education information will improve Description: Including pain rating scale, medication(s)/side effects and non-pharmacologic comfort measures Outcome: Progressing   Problem: Clinical Measurements: Goal: Ability to maintain clinical measurements within normal limits will improve Outcome: Progressing Goal: Will remain free from infection Outcome: Progressing Goal: Diagnostic test results will improve Outcome: Progressing Goal: Respiratory complications will improve Outcome: Progressing Goal: Cardiovascular complication will be avoided Outcome: Progressing   Problem: Nutrition: Goal: Adequate nutrition will be maintained Outcome: Progressing   Problem: Activity: Goal: Risk for activity intolerance will decrease Outcome: Progressing   Problem: Coping: Goal: Level of anxiety will decrease Outcome: Progressing   Problem: Elimination: Goal: Will not experience complications related to bowel motility Outcome: Progressing Goal: Will not experience complications related to urinary retention Outcome: Progressing   Problem: Pain Managment: Goal: General experience of comfort will improve Outcome: Progressing   Problem: Safety: Goal: Ability to remain free from injury will improve Outcome: Progressing   Problem: Skin Integrity: Goal: Risk for impaired skin integrity will decrease Outcome: Progressing

## 2020-11-12 ENCOUNTER — Inpatient Hospital Stay (HOSPITAL_COMMUNITY): Payer: Medicare HMO | Admitting: Certified Registered Nurse Anesthetist

## 2020-11-12 ENCOUNTER — Encounter (HOSPITAL_COMMUNITY): Payer: Self-pay | Admitting: Internal Medicine

## 2020-11-12 ENCOUNTER — Encounter (HOSPITAL_COMMUNITY)
Admission: EM | Disposition: A | Discharge status: Home Health | Payer: Self-pay | Source: Home / Self Care | Attending: Internal Medicine

## 2020-11-12 DIAGNOSIS — D125 Benign neoplasm of sigmoid colon: Secondary | ICD-10-CM

## 2020-11-12 DIAGNOSIS — D123 Benign neoplasm of transverse colon: Secondary | ICD-10-CM

## 2020-11-12 DIAGNOSIS — D122 Benign neoplasm of ascending colon: Secondary | ICD-10-CM

## 2020-11-12 DIAGNOSIS — K2960 Other gastritis without bleeding: Secondary | ICD-10-CM

## 2020-11-12 DIAGNOSIS — I441 Atrioventricular block, second degree: Secondary | ICD-10-CM | POA: Diagnosis not present

## 2020-11-12 HISTORY — PX: ESOPHAGOGASTRODUODENOSCOPY (EGD) WITH PROPOFOL: SHX5813

## 2020-11-12 HISTORY — PX: COLONOSCOPY WITH PROPOFOL: SHX5780

## 2020-11-12 HISTORY — PX: BIOPSY: SHX5522

## 2020-11-12 HISTORY — PX: POLYPECTOMY: SHX5525

## 2020-11-12 LAB — CBC
HCT: 30.9 % — ABNORMAL LOW (ref 39.0–52.0)
Hemoglobin: 10.2 g/dL — ABNORMAL LOW (ref 13.0–17.0)
MCH: 33.4 pg (ref 26.0–34.0)
MCHC: 33 g/dL (ref 30.0–36.0)
MCV: 101.3 fL — ABNORMAL HIGH (ref 80.0–100.0)
Platelets: 149 10*3/uL — ABNORMAL LOW (ref 150–400)
RBC: 3.05 MIL/uL — ABNORMAL LOW (ref 4.22–5.81)
RDW: 13.6 % (ref 11.5–15.5)
WBC: 5.9 10*3/uL (ref 4.0–10.5)
nRBC: 0 % (ref 0.0–0.2)

## 2020-11-12 LAB — COMPREHENSIVE METABOLIC PANEL
ALT: 49 U/L — ABNORMAL HIGH (ref 0–44)
AST: 62 U/L — ABNORMAL HIGH (ref 15–41)
Albumin: 2 g/dL — ABNORMAL LOW (ref 3.5–5.0)
Alkaline Phosphatase: 67 U/L (ref 38–126)
Anion gap: 7 (ref 5–15)
BUN: 7 mg/dL — ABNORMAL LOW (ref 8–23)
CO2: 22 mmol/L (ref 22–32)
Calcium: 9 mg/dL (ref 8.9–10.3)
Chloride: 110 mmol/L (ref 98–111)
Creatinine, Ser: 1.25 mg/dL — ABNORMAL HIGH (ref 0.61–1.24)
GFR, Estimated: >60 mL/min (ref >60)
Glucose, Bld: 85 mg/dL (ref 70–99)
Potassium: 4.1 mmol/L (ref 3.5–5.1)
Sodium: 139 mmol/L (ref 135–145)
Total Bilirubin: 0.7 mg/dL (ref 0.3–1.2)
Total Protein: 6 g/dL — ABNORMAL LOW (ref 6.5–8.1)

## 2020-11-12 LAB — CULTURE, BLOOD (ROUTINE X 2)
Culture: NO GROWTH
Special Requests: ADEQUATE
Special Requests: ADEQUATE

## 2020-11-12 LAB — MAGNESIUM: Magnesium: 2 mg/dL (ref 1.7–2.4)

## 2020-11-12 LAB — PHOSPHORUS: Phosphorus: 3.2 mg/dL (ref 2.5–4.6)

## 2020-11-12 SURGERY — COLONOSCOPY WITH PROPOFOL
Anesthesia: Monitor Anesthesia Care

## 2020-11-12 MED ORDER — PROPOFOL 500 MG/50ML IV EMUL
INTRAVENOUS | Status: DC | PRN
  Administered 2020-11-12: 100 ug/kg/min via INTRAVENOUS

## 2020-11-12 MED ORDER — EPHEDRINE SULFATE 50 MG/ML IJ SOLN
INTRAMUSCULAR | Status: DC | PRN
  Administered 2020-11-12 (×3): 10 mg via INTRAVENOUS

## 2020-11-12 MED ORDER — PANCRELIPASE (LIP-PROT-AMYL) 36000-114000 UNITS PO CPEP
60000.0000 [IU] | ORAL_CAPSULE | Freq: Three times a day (TID) | ORAL | Status: DC
  Administered 2020-11-12 – 2020-11-13 (×3): 60000 [IU] via ORAL
  Filled 2020-11-12 (×3): qty 2

## 2020-11-12 MED ORDER — LACTATED RINGERS IV SOLN
INTRAVENOUS | Status: AC | PRN
  Administered 2020-11-12: 10 mL/h via INTRAVENOUS

## 2020-11-12 MED ORDER — PHENYLEPHRINE 40 MCG/ML (10ML) SYRINGE FOR IV PUSH (FOR BLOOD PRESSURE SUPPORT)
PREFILLED_SYRINGE | INTRAVENOUS | Status: DC | PRN
  Administered 2020-11-12 (×4): 80 ug via INTRAVENOUS

## 2020-11-12 MED ORDER — PROPOFOL 10 MG/ML IV BOLUS
INTRAVENOUS | Status: DC | PRN
  Administered 2020-11-12 (×2): 20 mg via INTRAVENOUS

## 2020-11-12 MED ORDER — ALBUMIN HUMAN 5 % IV SOLN: INTRAVENOUS | Status: DC | PRN

## 2020-11-12 SURGICAL SUPPLY — 25 items
BLOCK BITE 60FR ADLT L/F BLUE (MISCELLANEOUS) ×3 IMPLANT
ELECT REM PT RETURN 9FT ADLT (ELECTROSURGICAL)
ELECTRODE REM PT RTRN 9FT ADLT (ELECTROSURGICAL) IMPLANT
FCP BXJMBJMB 240X2.8X (CUTTING FORCEPS)
FLOOR PAD 36X40 (MISCELLANEOUS) ×3
FORCEP RJ3 GP 1.8X160 W-NEEDLE (CUTTING FORCEPS) IMPLANT
FORCEPS BIOP RAD 4 LRG CAP 4 (CUTTING FORCEPS) IMPLANT
FORCEPS BIOP RJ4 240 W/NDL (CUTTING FORCEPS)
FORCEPS BXJMBJMB 240X2.8X (CUTTING FORCEPS) IMPLANT
INJECTOR/SNARE I SNARE (MISCELLANEOUS) IMPLANT
LUBRICANT JELLY 4.5OZ STERILE (MISCELLANEOUS) IMPLANT
MANIFOLD NEPTUNE II (INSTRUMENTS) IMPLANT
NDL SCLEROTHERAPY 25GX240 (NEEDLE) IMPLANT
NEEDLE SCLEROTHERAPY 25GX240 (NEEDLE) IMPLANT
PAD FLOOR 36X40 (MISCELLANEOUS) ×2 IMPLANT
PROBE APC STR FIRE (PROBE) IMPLANT
PROBE INJECTION GOLD (MISCELLANEOUS)
PROBE INJECTION GOLD 7FR (MISCELLANEOUS) IMPLANT
SNARE ROTATE MED OVAL 20MM (MISCELLANEOUS) IMPLANT
SNARE SHORT THROW 13M SML OVAL (MISCELLANEOUS) IMPLANT
SYR 50ML LL SCALE MARK (SYRINGE) IMPLANT
TRAP SPECIMEN MUCOUS 40CC (MISCELLANEOUS) IMPLANT
TUBING ENDO SMARTCAP PENTAX (MISCELLANEOUS) ×6 IMPLANT
TUBING IRRIGATION ENDOGATOR (MISCELLANEOUS) ×6 IMPLANT
WATER STERILE IRR 1000ML POUR (IV SOLUTION) IMPLANT

## 2020-11-12 NOTE — Op Note (Signed)
[facility] Patient Name: [name] Procedure Date : 11/12/2020 MRN: [id number] Attending MD: Estill Cotta. Loletha Carrow , MD Date of Birth: [birth year] CSN: [id number] Age: 75 Admit Type: Inpatient Procedure:                Colonoscopy Indications:              Weight loss (see inpatient consult note and EGD                            report for clinical details)                           prior polyp history suggesting attenuated FAP, last                            colonoscopy Oct 2020 with Dr. [name] Providers:                Estill Cotta. Loletha Carrow, MD, Jeanella Cara, RN,                            Tyrone Apple, Technician, Clearnce Sorrel, CRNA Referring MD:             Medical Teaching Service Medicines:                Monitored Anesthesia Care Complications:            No immediate complications. Estimated Blood Loss:     Estimated blood loss was minimal. Procedure:                Pre-Anesthesia Assessment:                           - Prior to the procedure, a History and Physical                            was performed, and patient medications and                            allergies were reviewed. The patient's tolerance of                            previous anesthesia was also reviewed. The risks                            and benefits of the procedure and the sedation                            options and risks were discussed with the patient.                            All questions were answered, and informed consent                            was obtained. Prior Anticoagulants: The patient has  taken no previous anticoagulant or antiplatelet                            agents. ASA Grade Assessment: III - A patient with                            severe systemic disease. After reviewing the risks                            and benefits, the patient was deemed in                            satisfactory condition to undergo the procedure.                            After obtaining informed consent, the colonoscope                            was passed under direct vision. Throughout the                            procedure, the patient's blood pressure, pulse, and                            oxygen saturations were monitored continuously. The                            [id number]-H190DL ([id number] Olympus pediatric colonscope                            was introduced through the anus and advanced to the                            the cecum, identified by appendiceal orifice and                            ileocecal valve. The colonoscopy was performed with                            difficulty due to a redundant colon and significant                            looping. Successful completion of the procedure was                            aided by using manual pressure. The patient                            tolerated the procedure well. The quality of the                            bowel preparation was good. The ileocecal valve,  appendiceal orifice, and rectum were photographed.                            The bowel preparation used was MoviPrep. Scope In: 9:35:46 AM Scope Out: 10:01:12 AM Scope Withdrawal Time: 0 hours 20 minutes 0 seconds  Total Procedure Duration: 0 hours 25 minutes 26 seconds  Findings:      The perianal and digital rectal examinations were normal.      Four sessile polyps were found in the proximal transverse colon and       ascending colon. The polyps were 4 to 8 mm in size. These polyps were       removed with a cold snare. Resection and retrieval were complete.      A 5 mm polyp was found in the proximal sigmoid colon. The polyp was       sessile. The polyp was removed with a cold snare. Resection and       retrieval were complete.      The exam was otherwise without abnormality on direct and retroflexion       views. Impression:               - Four 4 to 8 mm polyps in the proximal  transverse                            colon and in the ascending colon, removed with a                            cold snare. Resected and retrieved.                           - One 5 mm polyp in the proximal sigmoid colon,                            removed with a cold snare. Resected and retrieved.                           - The examination was otherwise normal on direct                            and retroflexion views. Recommendation:           - Return patient to hospital ward for ongoing care.                           - Resume regular diet.                           - Continue present medications.                           - Await pathology results.                           - Repeat colonoscopy is recommended for                            surveillance.  The colonoscopy date will be                            determined after pathology results from today's                            exam become available for review. Procedure Code(s):        --- Professional ---                           [phone], Colonoscopy, flexible; with removal of                            tumor(s), polyp(s), or other lesion(s) by snare                            technique Diagnosis Code(s):        --- Professional ---                           K63.5, Polyp of colon                           R63.4, Abnormal weight loss CPT copyright 2019 [employer]. All rights reserved. The codes documented in this report are preliminary and upon coder review may  be revised to meet current compliance requirements. [name] L. Loletha Carrow, MD 11/12/2020 10:25:24 AM This report has been signed electronically. Number of Addenda: 0

## 2020-11-12 NOTE — Progress Notes (Signed)
See EGD and colonoscopy reports.  Pancreatic enzyme supplements ordered and should be continued after discharge.  He can be discharged home any time from a GI perspective, and we will arrange outpatient follow up with Dr. [name] to address chronic pancreatitis and imaging/endoscopic findings.   Wilfrid Lund, MD    [facility]

## 2020-11-12 NOTE — TOC Initial Note (Addendum)
Transition of Care [facility]) - Initial/Assessment Note    Patient Details  Name: [name] MRN: [id number] Date of Birth: 05/29/1946  Transition of Care [facility]) CM/SW Contact:    Trula Ore, Jud Phone Number: 11/12/2020, 12:58 PM  Clinical Narrative:          CSW received consult for possible SNF placement at time of discharge. CSW spoke with patient regarding PT recommendation of SNF placement at time of discharge. Patient comes from home alone. Patient expressed understanding of PT recommendation and is agreeable to SNF placement at time of discharge. Patient gave CSW permission to fax out initial referral near [place] area. Patient has received the COVID vaccines as well as booster. CSW tried to start insurance authorization for patient through [facility] portal.Received error message. Not sure if patient still is covered with [employer]. If they are need to confirm if patient has regular humana. If so, facility will start insurance auth.Patient gave CSW permission to discuss care with brother Jeneen Rinks and Jennette Banker. [name] tele# [phone]. No further questions reported at this time. CSW to continue to follow and assist with discharge planning needs.           Expected Discharge Plan: Skilled Nursing Facility Barriers to Discharge: Continued Medical Work up   Patient Goals and CMS Choice Patient states their goals for this hospitalization and ongoing recovery are:: to go to SNF CMS Medicare.gov Compare Post Acute Care list provided to:: Patient Choice offered to / list presented to : Patient  Expected Discharge Plan and Services Expected Discharge Plan: [place] In-house Referral: Clinical Social Work     Living arrangements for the past 2 months: [place]                                      Prior Living Arrangements/Services Living arrangements for the past 2 months: Single Family Home Lives with:: Self Patient language and need for  interpreter reviewed:: Yes Do you feel safe going back to the place where you live?: No   SNF  Need for Family Participation in Patient Care: Yes (Comment) Care giver support system in place?: Yes (comment)   Criminal Activity/Legal Involvement Pertinent to Current Situation/Hospitalization: No - Comment as needed  Activities of Daily Living Home Assistive Devices/Equipment: None ADL Screening (condition at time of admission) Patient's cognitive ability adequate to safely complete daily activities?: Yes Is the patient deaf or have difficulty hearing?: No Does the patient have difficulty seeing, even when wearing glasses/contacts?: No Does the patient have difficulty concentrating, remembering, or making decisions?: No Patient able to express need for assistance with ADLs?: Yes Does the patient have difficulty dressing or bathing?: No Independently performs ADLs?: No Communication: Independent Dressing (OT): Independent Grooming: Independent Feeding: Independent Bathing: Needs assistance Is this a change from baseline?: Change from baseline, expected to last >3 days Toileting: Needs assistance Is this a change from baseline?: Change from baseline, expected to last >3days In/Out Bed: Needs assistance Is this a change from baseline?: Change from baseline, expected to last >3 days Walks in Home: Needs assistance Is this a change from baseline?: Change from baseline, expected to last >3 days Does the patient have difficulty walking or climbing stairs?: Yes Weakness of Legs: Both Weakness of Arms/Hands: Both  Permission Sought/Granted Permission sought to share information with : [place] granted to share information with : Yes,  Verbal Permission Granted  Share Information with NAME: Jeneen Rinks and Pearl granted to share info w AGENCY: SNF  Permission granted to share info w Relationship: brother and  sister  Permission granted to share info w Contact Information: Jeneen Rinks [phone] Janifer Adie [phone]  Emotional Assessment Appearance:: Appears stated age Attitude/Demeanor/Rapport: Gracious Affect (typically observed): Calm Orientation: : Oriented to Self,Oriented to Place,Oriented to  Time,Oriented to Situation Alcohol / Substance Use: Not Applicable Psych Involvement: No (comment)  Admission diagnosis:  Elevated troponin [R77.8] AKI (acute kidney injury) ([place]) [N17.9] Bacteremia [R78.81] Patient Active Problem List   Diagnosis Date Noted  . Mobitz (type) I (Wenckebach's) atrioventricular block   . Pancreatic mass 11/08/2020  . 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[place]) 11/08/2020  . Protein-calorie malnutrition, severe 11/07/2020  . Bacteremia 11/07/2020  . AKI (acute kidney injury) ([place]) 11/06/2020  . Unintentional weight loss 08/14/2020  . Urinary retention 04/02/2018  . Mild alcohol use disorder 12/09/2016  . Premature atrial contractions 07/31/2015  . Chronic use of opiate for therapeutic purpose 05/01/2015  . Atherosclerosis of aorta ([place]) 01/13/2015  . Prediabetes 12/30/2014  . AAA (abdominal aortic aneurysm) ([place])   . Healthcare maintenance 06/30/2012  . CAD (coronary artery disease) 03/17/2012  . Erectile dysfunction 10/12/2010  . Arthropathy of pelvic region and thigh 05/02/2008  . CKD (chronic kidney disease) stage 2, GFR 60-89 ml/min 08/07/2007  . 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  . Gout 04/18/2006  . Tobacco use disorder 04/18/2006  . Essential hypertension 04/18/2006  . GERD 04/18/2006   PCP:  Axel Filler, MD Pharmacy:   [id number] [place], [place] [address] [place] [address] Phone: [phone] Fax: [phone]  [facility] [place], [place] [address] Phone: [phone] Fax:  [phone]     Social Determinants of Health (SDOH) Interventions    Readmission Risk Interventions No flowsheet data found.

## 2020-11-12 NOTE — Anesthesia Postprocedure Evaluation (Signed)
Anesthesia Post Note  Patient: Filbert Berthold  Procedure(s) Performed: COLONOSCOPY WITH PROPOFOL (N/A ) BIOPSY POLYPECTOMY ESOPHAGOGASTRODUODENOSCOPY (EGD) WITH PROPOFOL (N/A )     Patient location during evaluation: PACU Anesthesia Type: MAC Level of consciousness: awake and alert Pain management: pain level controlled Vital Signs Assessment: post-procedure vital signs reviewed and stable Respiratory status: spontaneous breathing, nonlabored ventilation and respiratory function stable Cardiovascular status: stable and blood pressure returned to baseline Anesthetic complications: no   No complications documented.  Last Vitals:  Vitals:   11/12/20 1032 11/12/20 1050  BP: (!) 104/54 113/76  Pulse: 79 73  Resp: 16 19  Temp:    SpO2: 97% 100%    Last Pain:  Vitals:   11/12/20 1032  TempSrc:   PainSc: 0-No pain                 Audry Pili

## 2020-11-12 NOTE — Plan of Care (Signed)
  Problem: Clinical Measurements: Goal: Will remain free from infection Outcome: Adequate for Discharge   

## 2020-11-12 NOTE — Progress Notes (Addendum)
Subjective:  Overnight, no acute events reported.   This morning, [name] states he's feeling alright. He denies any symptoms including CP, abdominal pain, light-headedness or dizziness and says he's eager to go home. He continues to wish to live independently and says he's not willing to go to SNF. States he'd done well at home following weight loss associated with lung cancer following a prolonged hospital stay. He is open to PSA testing today to assess for prostate cancer and understands he will need close GI outpatient follow up to continue workup for his weight loss. He does have an appetite today.   Objective:  Vital signs in last 24 hours: Vitals:   11/12/20 1725 11/12/20 2000 11/13/20 0604 11/13/20 0852  BP: (!) 110/56 105/68 106/69 (!) 101/57  Pulse: 63 88 81 83  Resp: 19 17 18 18   Temp: 98.3 F (36.8 C) 98 F (36.7 C) 99.1 F (37.3 C) 98 F (36.7 C)  TempSrc: Oral Oral Oral Oral  SpO2: 98% 97% 100% 100%  Weight:      Height:       CBC Latest Ref Rng & Units 11/13/2020 11/12/2020 11/11/2020  WBC 4.0 - 10.5 K/uL 5.9 5.9 4.9  Hemoglobin 13.0 - 17.0 g/dL 9.1(L) 10.2(L) 8.6(L)  Hematocrit 39.0 - 52.0 % 27.4(L) 30.9(L) 26.0(L)  Platelets 150 - 400 K/uL 135(L) 149(L) 128(L)   CMP Latest Ref Rng & Units 11/13/2020 11/12/2020 11/11/2020  Glucose 70 - 99 mg/dL 84 85 104(H)  BUN 8 - 23 mg/dL 8 7(L) 9  Creatinine 0.61 - 1.24 mg/dL 1.28(H) 1.25(H) 1.10  Sodium 135 - 145 mmol/L 138 139 136  Potassium 3.5 - 5.1 mmol/L 4.1 4.1 3.6  Chloride 98 - 111 mmol/L 108 110 104  CO2 22 - 32 mmol/L 24 22 28   Calcium 8.9 - 10.3 mg/dL 9.0 9.0 8.6(L)  Total Protein 6.5 - 8.1 g/dL 5.6(L) 6.0(L) 5.3(L)  Total Bilirubin 0.3 - 1.2 mg/dL 0.6 0.7 0.8  Alkaline Phos 38 - 126 U/L 66 67 58  AST 15 - 41 U/L 46(H) 62(H) 54(H)  ALT 0 - 44 U/L 41 49(H) 41    Physical Exam  Constitutional: Chronically ill appearing elderly male,  No distress.  HENT: MMM. Edentulous (maxillary).  Cardiovascular:  Normal rate, irregularly irregular rhythm. No m/r/g or peripheral edema. Respiratory: No respiratory distress, Effort is normal.  Lungs are clear to auscultation bilaterally anteriorly.  Abdominal: Slightly firm although non-tender and non-distended with normal active bowel sounds. No palpable masses.  Musculoskeletal: severe diffuse cachexia Neurological: Is alert and oriented x4, no apparent focal deficits noted. Psychiatric: Normal mood and affect.   Assessment/Plan:  Active Problems:   AKI (acute kidney injury) ([place])   Protein-calorie malnutrition, severe   Bacteremia   Pancreatic mass   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HCC)   Mobitz (type) I (Wenckebach's) atrioventricular block   Enterobacter Sakazakii Bacteremia Unintentional weight loss/physical deconditioning  Concerns for GI malignancy vs exocrine pancreatic insufficiency  Patient remains afebrile without leukocytosis s/p conversion to Bactrim, completing last day of treatment. 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 Pancreatic workup notable for possible IPMN's without evidence of overt malignancy on CT/MRI. He does have significant prostatic enlargement and had been reluctant to have PSA checked in the past, although  is open to this today to assess for prostate malignancy, although without symptoms (other than chronic urinary retention), this would be less likely to cause his worsening weight loss. I suspect his unintentional weight loss is due to pancreatic insufficiency (w/ pancreatic calcifications possibly due to chronic pancreatitis) vs. Due to generalized weakness in the setting of excessive antihypertensive medications vs. Due to underlying chronic conditions including emphysema  (although asymptomatic here), possible cirrhosis (F3-F4) and possible underlying dementia.  - Appreciate GI's assistance - he will follow up in clinic with Dr. 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 He did have multiple gastric erosions on endoscopy this admission although without signs suggestive of recent bleeding and without any overt bleeding in the hospital with normal iron studies. Suspect worsening anemia of chronic disease vs. Previous hemoconcentration.  - Continue to trend CBC's  - Vascular surgery recommend therapeutic anticoagulation. Oncology suggested holding off on this due to concern he is at high risk of bleeding, although given no overt malignancies were identified. For now, will hold off on ASA 81mg  daily and restarting therapeutic anticoagulation. 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 Cardiology were consulted and recommend continuous monitoring on telemetry.  - Restart telemetry monitoring  - Continue close monitoring of vitals   Triple Vessel CAD Despite complaints of exertional dyspnea and CT scan with severe atherosclerosis, he has not had any concerning  anginal symptoms.   - Continue crestor 20mg  daily  - Consider resuming aspirin 81mg  daily on discharge  Hepatic Steatosis Hx AUD  AST remains persistently mildly elevated although other enzymes and bilirubin are WNL. He does have thrombocytopenia dn low albumin with F3-F4 concerning for severe liver fibrosis vs. Early cirrhosis. - Will require outpatient follow up and encouragement of continued alcohol cessation    Diet: Regular  Fluids: None  DVT Prophylaxis: SCDs Code status: FULL   Prior to Admission Living Arrangement: Home  Anticipated Discharge Location: Home w/ HH (patient's preference over SNF) Barriers to Discharge: Pending repeat PT/OT eval for home needs Dispo: Anticipated discharge today   [name] Bennett, MD  IMTS PGY-1 11/13/2020, 10:33 AM Pager: [phone] After 5pm on weekdays and 1pm on weekends: On Call pager [phone]

## 2020-11-12 NOTE — Interval H&P Note (Signed)
History and Physical Interval Note:  11/12/2020 8:42 AM  [name]  has presented today for surgery, with the diagnosis of Weight loss.  History of colon polyps.  Macrocytic anemia..  The various methods of treatment have been discussed with the patient and family. After consideration of risks, benefits and other options for treatment, the patient has consented to  Procedure(s): COLONOSCOPY WITH PROPOFOL (N/A) ESOPHAGOGASTRODUODENOSCOPY (EGD) WITH PROPOFOL (N/A) as a surgical intervention.  The patient's history has been reviewed, patient examined, no change in status, stable for surgery.  I have reviewed the patient's chart and labs.  Questions were answered to the patient's satisfaction.    Clinically stable since yesterday.  Cardiology saw patient this morning and condition is stable.  Nelida Meuse III

## 2020-11-12 NOTE — Anesthesia Preprocedure Evaluation (Addendum)
Anesthesia Evaluation  Patient identified by MRN, date of birth, ID band Patient awake    Reviewed: Allergy & Precautions, NPO status , Patient's Chart, lab work & pertinent test results, reviewed documented beta blocker date and time   History of Anesthesia Complications Negative for: history of anesthetic complications  Airway Mallampati: I  TM Distance: >3 FB Neck ROM: Full    Dental  (+) Edentulous Upper, Edentulous Lower   Pulmonary Current Smoker and Patient abstained from smoking.,   Lung cancer    Pulmonary exam normal        Cardiovascular hypertension, Pt. on medications and Pt. on home beta blockers + CAD, + Past MI and + Cardiac Stents  Normal cardiovascular exam+ dysrhythmias    '22 TTE - EF 55 to 60%. Mild left ventricular hypertrophy. Grade I diastolic dysfunction (impaired relaxation). Trivial TR.    Neuro/Psych negative neurological ROS  negative psych ROS   GI/Hepatic hiatal hernia, GERD  Controlled and Medicated,(+)     substance abuse  alcohol use,   Endo/Other   Pre-DM   Renal/GU CRFRenal disease     Musculoskeletal  (+) Arthritis ,  Gout    Abdominal   Peds  Hematology  (+) anemia ,  Plt 149k    Anesthesia Other Findings   Reproductive/Obstetrics                            Anesthesia Physical Anesthesia Plan  ASA: III  Anesthesia Plan: MAC   Post-op Pain Management:    Induction: Intravenous  PONV Risk Score and Plan: 1 and Propofol infusion and Treatment may vary due to age or medical condition  Airway Management Planned: Nasal Cannula and Natural Airway  Additional Equipment: None  Intra-op Plan:   Post-operative Plan:   Informed Consent: I have reviewed the patients History and Physical, chart, labs and discussed the procedure including the risks, benefits and alternatives for the proposed anesthesia with the patient or authorized  representative who has indicated his/her understanding and acceptance.       Plan Discussed with: CRNA and Anesthesiologist  Anesthesia Plan Comments:        Anesthesia Quick Evaluation

## 2020-11-12 NOTE — NC FL2 (Signed)
[place] LEVEL OF CARE SCREENING TOOL     IDENTIFICATION  Patient Name: [name] Birthdate: [birth year] Sex: male Admission Date (Current Location): 11/06/2020  [facility] and [place] Number:  Herbalist and Address:  [facility], [place] [address] [place], [place] [address]      Provider Number: [id number]  Attending Physician Name and Address:  Velna Ochs, MD  Relative Name and Phone Number:  Jeneen Rinks [phone]    Current Level of Care: Hospital Recommended Level of Care: [place] Prior Approval Number:    Date Approved/Denied:   PASRR Number: [id number]  Discharge Plan: SNF    Current Diagnoses: Patient Active Problem List   Diagnosis Date Noted  . Mobitz (type) I (Wenckebach's) atrioventricular block   . Pancreatic mass 11/08/2020  . 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[place]) 11/08/2020  . Protein-calorie malnutrition, severe 11/07/2020  . Bacteremia 11/07/2020  . AKI (acute kidney injury) ([place]) 11/06/2020  . Unintentional weight loss 08/14/2020  . Urinary retention 04/02/2018  . Mild alcohol use disorder 12/09/2016  . Premature atrial contractions 07/31/2015  . Chronic use of opiate for therapeutic purpose 05/01/2015  . Atherosclerosis of aorta ([place]) 01/13/2015  . Prediabetes 12/30/2014  . AAA (abdominal aortic aneurysm) ([place])   . Healthcare maintenance 06/30/2012  . CAD (coronary artery disease) 03/17/2012  . Erectile dysfunction 10/12/2010  . Arthropathy of pelvic region and thigh 05/02/2008  . CKD (chronic kidney disease) stage 2, GFR 60-89 ml/min 08/07/2007  . 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  . Gout 04/18/2006  . Tobacco use disorder 04/18/2006  . Essential hypertension 04/18/2006  . GERD 04/18/2006    Orientation RESPIRATION BLADDER Height & Weight     Self,Time,Situation,Place  Normal Continent (Urethral Catheter 16 Fr.) Weight: 135 lb (61.2  kg) Height:  6\' 1"  (185.4 cm)  BEHAVIORAL SYMPTOMS/MOOD NEUROLOGICAL BOWEL NUTRITION STATUS      Continent Diet (Please see discharge summary)  AMBULATORY STATUS COMMUNICATION OF NEEDS Skin   Limited Assist Verbally Normal                       Personal Care Assistance Level of Assistance  Bathing,Feeding,Dressing Bathing Assistance: Limited assistance Feeding assistance: Independent Dressing Assistance: Limited assistance     Functional Limitations Info  Sight,Hearing,Speech Sight Info: Adequate Hearing Info: Adequate Speech Info: Adequate    SPECIAL CARE FACTORS FREQUENCY  PT (By licensed PT),OT (By licensed OT)     PT Frequency: 5x min weekly OT Frequency: 5x min weekly            Contractures Contractures Info: Not present    Additional Factors Info  Code Status,Allergies Code Status Info: FULL Allergies Info: Candesartan Cilexetil-hctz,Hydrochlorothiazide,Shellfish Allergy           Current Medications (11/12/2020):  This is the current hospital active medication list Current Facility-Administered Medications  Medication Dose Route Frequency Provider Last Rate Last Admin  . 0.9 %  sodium chloride infusion   Intravenous PRN Doran Stabler, MD   Stopped at 11/07/20 2300  . acetaminophen (TYLENOL) tablet 650 mg  650 mg Oral Q6H PRN Nelida Meuse III, MD   650 mg at 11/09/20 1207  . allopurinol (ZYLOPRIM) tablet 100 mg  100 mg Oral QHS Nelida Meuse III, MD   100 mg at 11/11/20 2255  . Chlorhexidine Gluconate Cloth 2 % PADS 6 each  6 each Topical Q0600 Nelida Meuse  III, MD   6 each at 11/11/20 0825  . feeding supplement (ENSURE ENLIVE / ENSURE PLUS) liquid 237 mL  237 mL Oral TID BM Nelida Meuse III, MD   237 mL at 11/11/20 1500  . HYDROcodone-acetaminophen (NORCO/VICODIN) 5-325 MG per tablet 1 tablet  1 tablet Oral BID PRN Doran Stabler, MD   1 tablet at 11/08/20 1456  . lipase/protease/amylase (CREON) capsule 60,000 Units  60,000 Units Oral  TID AC Danis, Estill Cotta III, MD      . multivitamin with minerals tablet 1 tablet  1 tablet Oral Daily Nelida Meuse III, MD   1 tablet at 11/11/20 0825  . ondansetron (ZOFRAN) injection 4 mg  4 mg Intravenous Q8H PRN Nelida Meuse III, MD      . ramelteon (ROZEREM) tablet 8 mg  8 mg Oral QHS Nelida Meuse III, MD   8 mg at 11/11/20 2255  . rosuvastatin (CRESTOR) tablet 20 mg  20 mg Oral Daily Nelida Meuse III, MD   20 mg at 11/11/20 0825  . sodium chloride flush (NS) 0.9 % injection 3 mL  3 mL Intravenous Q12H Nelida Meuse III, MD   3 mL at 11/11/20 2258  . sulfamethoxazole-trimethoprim (BACTRIM DS) 800-160 MG per tablet 1 tablet  1 tablet Oral Q12H Nelida Meuse III, MD   1 tablet at 11/11/20 2255  . tamsulosin (FLOMAX) capsule 0.8 mg  0.8 mg Oral QPM Nelida Meuse III, MD   0.8 mg at 11/11/20 1713     Discharge Medications: Please see discharge summary for a list of discharge medications.  Relevant Imaging Results:  Relevant Lab Results:   Additional Information [id number]  Trula Ore, LCSWA

## 2020-11-12 NOTE — Op Note (Signed)
[facility] Patient Name: [name] Procedure Date : 11/12/2020 MRN: [id number] Attending MD: Estill Cotta. Loletha Carrow , MD Date of Birth: 1946-05-06 CSN: [id number] Age: 75 Admit Type: Inpatient Procedure:                Upper GI endoscopy Indications:              Weight loss                           see inpatient consult note for clinical details                           EtOH chronic pancreatitis, slow weight loss over 2                            years.                           microcystic changes without mass in pancreas,                            elevated CA 19-9 Providers:                Mallie Mussel L. Loletha Carrow, MD, Jeanella Cara, RN,                            Tyrone Apple, Technician, Clearnce Sorrel, CRNA Referring MD:             Medical Teaching Service Medicines:                Monitored Anesthesia Care Complications:            No immediate complications. Estimated Blood Loss:     Estimated blood loss was minimal. Procedure:                Pre-Anesthesia Assessment:                           - Prior to the procedure, a History and Physical                            was performed, and patient medications and                            allergies were reviewed. The patient's tolerance of                            previous anesthesia was also reviewed. The risks                            and benefits of the procedure and the sedation                            options and risks were discussed with the patient.                            All questions were answered, and informed  consent                            was obtained. Prior Anticoagulants: The patient has                            taken no previous anticoagulant or antiplatelet                            agents. ASA Grade Assessment: III - A patient with                            severe systemic disease. After reviewing the risks                            and benefits, the patient was deemed in                             satisfactory condition to undergo the procedure.                           After obtaining informed consent, the endoscope was                            passed under direct vision. Throughout the                            procedure, the patient's blood pressure, pulse, and                            oxygen saturations were monitored continuously. The                            GIF-H190 ([id number]) Olympus gastroscope was                            introduced through the mouth, and advanced to the                            second part of duodenum (bulb). The PCF-H190DL                            ([id number]) Olympus pediatric colonscope was then                            introduced through the mouth and advanced to the                            second portion of the duodenum. The upper GI                            endoscopy was performed with difficulty due to a  J-shaped stomach which made passage to second                            portrion of the duodenum difficult. The patient                            tolerated the procedure well. Scope In: Scope Out: Findings:      The examined esophagus was normal.      There is no endoscopic evidence of Barrett's esophagus, esophagitis,       stricture or varices in the entire esophagus.      Multiple erosions with no bleeding and no stigmata of recent bleeding       were found in the gastric antrum. Biopsies were taken with a cold       forceps for histology (antrum and body to r/o H pylori).      The cardia and gastric fundus were normal on retroflexion. Stomach       J-shaped (see above)      Mucosal changes were found in the area of the papilla. Mild edema of       bulb/sweep. Prominent ampulla with possible adenomatous changes (see       photos). Not frankly malignant-appearing. Biopsy attempted, but could       not be performed due to challenging anatomy and location of finding.        (could not get adequate position/angle to Bx papilla). Impression:               - Normal esophagus.                           - Gastric erosions with no bleeding and no stigmata                            of recent bleeding. Biopsied.                           - Mucosal changes in the duodenum.                           No clear cause for weight loss, no malignancy seen                            on EGD or colonoscopy. 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 Recommendation:           - See the other procedure note for documentation of                            additional recommendations.                           - Patient will need outpatient EUS of the pancreas                            with passage of  a duodenoscope to fully evaluate                            and biopsy the major papilla.                           - Begin pancreatic enzyme supplements as ordered.                           - Clinic follow up with Dr. Silvano Rusk will be                            arranged to follow patient and arrange further                            testing as noted above. Procedure Code(s):        --- Professional ---                           [phone], Esophagogastroduodenoscopy, flexible,                            transoral; with biopsy, single or multiple Diagnosis Code(s):        --- Professional ---                           K25.9, Gastric ulcer, unspecified as acute or                            chronic, without hemorrhage or perforation                           K31.89, Other diseases of stomach and duodenum                           R63.4, Abnormal weight loss CPT copyright 2019 American Medical Association. All rights reserved. The codes documented in this report are preliminary and upon coder review may  be revised to meet current compliance requirements. Geremiah Fussell L. Loletha Carrow, MD 11/12/2020 10:21:04 AM This report has been signed  electronically. Number of Addenda: 0

## 2020-11-12 NOTE — Transfer of Care (Signed)
Immediate Anesthesia Transfer of Care Note  Patient: [name]  Procedure(s) Performed: COLONOSCOPY WITH PROPOFOL (N/A ) BIOPSY POLYPECTOMY ESOPHAGOGASTRODUODENOSCOPY (EGD) WITH PROPOFOL (N/A )  Patient Location: PACU  Anesthesia Type:MAC  Level of Consciousness: awake and alert   Airway & Oxygen Therapy: Patient Spontanous Breathing and Patient connected to nasal cannula oxygen  Post-op Assessment: Report given to RN and Post -op Vital signs reviewed and stable  Post vital signs: Reviewed and stable  Last Vitals:  Vitals Value Taken Time  BP    Temp    Pulse    Resp    SpO2      Last Pain:  Vitals:   11/12/20 0841  TempSrc: Oral  PainSc: 0-No pain      Patients Stated Pain Goal: 2 (71/06/26 9485)  Complications: No complications documented.

## 2020-11-12 NOTE — Progress Notes (Signed)
Progress Note  Patient Name: [name] Date of Encounter: 11/12/2020  [facility] Cardiologist: New  Subjective   No CP or dyspnea  Inpatient Medications    Scheduled Meds: . allopurinol  100 mg Oral QHS  . Chlorhexidine Gluconate Cloth  6 each Topical Q0600  . feeding supplement  237 mL Oral TID BM  . multivitamin with minerals  1 tablet Oral Daily  . ramelteon  8 mg Oral QHS  . rosuvastatin  20 mg Oral Daily  . sodium chloride flush  3 mL Intravenous Q12H  . sulfamethoxazole-trimethoprim  1 tablet Oral Q12H  . tamsulosin  0.8 mg Oral QPM   Continuous Infusions: . sodium chloride Stopped (11/07/20 2300)   PRN Meds: sodium chloride, acetaminophen, HYDROcodone-acetaminophen, ondansetron (ZOFRAN) IV   Vital Signs    Vitals:   11/11/20 0911 11/11/20 1700 11/11/20 2117 11/12/20 0434  BP: 94/62 99/65 116/67 111/75  Pulse: (!) 57 82 62 72  Resp: 18 16 17 17   Temp: 98.2 F (36.8 C) 97.7 F (36.5 C) 97.8 F (36.6 C) 97.7 F (36.5 C)  TempSrc: Oral Oral Oral Oral  SpO2: 98% 99%  100%  Weight:      Height:        Intake/Output Summary (Last 24 hours) at 11/12/2020 0813 Last data filed at 11/12/2020 6789 Gross per 24 hour  Intake 2147.14 ml  Output 1900 ml  Net 247.14 ml   Last 3 Weights 11/11/2020 11/10/2020 11/09/2020  Weight (lbs) 140 lb 10.5 oz 138 lb 7.2 oz 156 lb 15.5 oz  Weight (kg) 63.8 kg 62.8 kg 71.2 kg      Telemetry    Sinus with pacs, pat, occasional junctional escape beat and 5 beats NSVT - Personally Reviewed   Physical Exam   GEN: No acute distress; thin.   Neck: No JVD Cardiac: irregular  Respiratory: Clear to auscultation bilaterally. GI: Soft, nontender, non-distended  MS: No edema Neuro:  Nonfocal  Psych: Normal affect   Labs    High Sensitivity Troponin:   Recent Labs  Lab 11/06/20 1328 11/06/20 1600 11/06/20 1844  TROPONINIHS 66* 69* 69*      Chemistry Recent Labs  Lab 11/09/20 0316 11/10/20 0621  11/11/20 0231  NA 136 138 136  K 3.7 4.0 3.6  CL 104 106 104  CO2 29 27 28   GLUCOSE 94 93 104*  BUN 13 11 9   CREATININE 1.12 1.09 1.10  CALCIUM 8.8* 8.7* 8.6*  PROT 5.1* 5.1* 5.3*  ALBUMIN 1.7* 1.7* 1.7*  AST 53* 48* 54*  ALT 41 38 41  ALKPHOS 76 55 58  BILITOT 0.9 0.5 0.8  GFRNONAA >60 >60 >60  ANIONGAP 3* 5 4*     Hematology Recent Labs  Lab 11/10/20 0342 11/11/20 0231 11/12/20 0711  WBC 5.4 4.9 5.9  RBC 2.71* 2.54* 3.05*  HGB 9.2* 8.6* 10.2*  HCT 27.5* 26.0* 30.9*  MCV 101.5* 102.4* 101.3*  MCH 33.9 33.9 33.4  MCHC 33.5 33.1 33.0  RDW 13.6 13.6 13.6  PLT 150 128* 149*    Patient Profile     75 year old male with past medical history of coronary artery disease, abdominal aortic aneurysm, chronic stage II kidney disease, hypertension, alcohol abuse, Barrett's esophagus, history of lung cancer, osteomyelitis, diverticulosis admitted with progressive weakness and weight loss for evaluation of bradycardia.  Work-up has demonstrated Enterobacterales bacteremia, possible pancreatic tumor and possible underlying cirrhosis.  He has been noted to have bradycardia and Mobitz 2 on telemetry and  cardiology asked to evaluate.  Echocardiogram shows normal LV function, mild left ventricular hypertrophy, grade 1 diastolic dysfunction. Metoprolol DCed.   Assessment & Plan    1 Mobitz 1 second-degree AV block-Not evident on last 12 hours of telemetry.  Continue off of metoprolol.  LV function is normal.  Note patient has had no symptoms including presyncope or syncope.  2 coronary artery disease-continue statin.  Resume aspirin when okay with primary service.  3 abdominal aortic aneurysm-we will need follow-up with vascular surgery after discharge.  4 other issues per primary care.  For questions or updates, please contact [place] Please consult www.Amion.com for contact info under        Signed, Kirk Ruths, MD  11/12/2020, 8:13 AM

## 2020-11-13 ENCOUNTER — Encounter (HOSPITAL_COMMUNITY): Payer: Self-pay | Admitting: Gastroenterology

## 2020-11-13 DIAGNOSIS — I441 Atrioventricular block, second degree: Secondary | ICD-10-CM | POA: Diagnosis not present

## 2020-11-13 DIAGNOSIS — E43 Unspecified severe protein-calorie malnutrition: Secondary | ICD-10-CM | POA: Diagnosis not present

## 2020-11-13 DIAGNOSIS — R7881 Bacteremia: Secondary | ICD-10-CM | POA: Diagnosis not present

## 2020-11-13 DIAGNOSIS — N179 Acute kidney failure, unspecified: Secondary | ICD-10-CM | POA: Diagnosis not present

## 2020-11-13 LAB — CBC
HCT: 27.4 % — ABNORMAL LOW (ref 39.0–52.0)
Hemoglobin: 9.1 g/dL — ABNORMAL LOW (ref 13.0–17.0)
MCH: 33.6 pg (ref 26.0–34.0)
MCHC: 33.2 g/dL (ref 30.0–36.0)
MCV: 101.1 fL — ABNORMAL HIGH (ref 80.0–100.0)
Platelets: 135 10*3/uL — ABNORMAL LOW (ref 150–400)
RBC: 2.71 MIL/uL — ABNORMAL LOW (ref 4.22–5.81)
RDW: 13.7 % (ref 11.5–15.5)
WBC: 5.9 10*3/uL (ref 4.0–10.5)
nRBC: 0 % (ref 0.0–0.2)

## 2020-11-13 LAB — COMPREHENSIVE METABOLIC PANEL
ALT: 41 U/L (ref 0–44)
AST: 46 U/L — ABNORMAL HIGH (ref 15–41)
Albumin: 2 g/dL — ABNORMAL LOW (ref 3.5–5.0)
Alkaline Phosphatase: 66 U/L (ref 38–126)
Anion gap: 6 (ref 5–15)
BUN: 8 mg/dL (ref 8–23)
CO2: 24 mmol/L (ref 22–32)
Calcium: 9 mg/dL (ref 8.9–10.3)
Chloride: 108 mmol/L (ref 98–111)
Creatinine, Ser: 1.28 mg/dL — ABNORMAL HIGH (ref 0.61–1.24)
GFR, Estimated: 59 mL/min — ABNORMAL LOW (ref >60)
Glucose, Bld: 84 mg/dL (ref 70–99)
Potassium: 4.1 mmol/L (ref 3.5–5.1)
Sodium: 138 mmol/L (ref 135–145)
Total Bilirubin: 0.6 mg/dL (ref 0.3–1.2)
Total Protein: 5.6 g/dL — ABNORMAL LOW (ref 6.5–8.1)

## 2020-11-13 LAB — PHOSPHORUS: Phosphorus: 2.1 mg/dL — ABNORMAL LOW (ref 2.5–4.6)

## 2020-11-13 LAB — MAGNESIUM: Magnesium: 2 mg/dL (ref 1.7–2.4)

## 2020-11-13 MED ORDER — POTASSIUM PHOSPHATES 15 MMOLE/5ML IV SOLN
10.0000 mmol | Freq: Once | INTRAVENOUS | Status: AC
  Administered 2020-11-13: 10 mmol via INTRAVENOUS
  Filled 2020-11-13: qty 3.33

## 2020-11-13 MED ORDER — ALBUTEROL SULFATE HFA 108 (90 BASE) MCG/ACT IN AERS: 1.0000 | INHALATION_SPRAY | Freq: Four times a day (QID) | RESPIRATORY_TRACT | 0 refills | Status: DC | PRN

## 2020-11-13 MED ORDER — SULFAMETHOXAZOLE-TRIMETHOPRIM 800-160 MG PO TABS: 1.0000 | ORAL_TABLET | Freq: Two times a day (BID) | ORAL | 0 refills | Status: DC

## 2020-11-13 MED ORDER — PANCRELIPASE (LIP-PROT-AMYL) 20000-63000 UNITS PO CPEP: 60000.0000 [IU] | ORAL_CAPSULE | Freq: Three times a day (TID) | ORAL | 0 refills | Status: DC

## 2020-11-13 NOTE — Progress Notes (Addendum)
Dr Jacalyn Lefevre rounding note reviewed. Bradycardia has improved off beta blocker from rounding notes. He has been asymptomatic. No telemetry currently, nursing reports it was taken off yesterday however there remains a current order. Restart tele today, rates by vitals checks are WNL. No additional recs today. Follow peripherally tomorrow with telemetry check.    Carlyle Dolly MD

## 2020-11-13 NOTE — Progress Notes (Signed)
1 hour of discharge education provided to patient, patients sister and brother. All 3 present during d/c education. Pt reviewed medication and understood what medications to stop. Pt understood to call tomorrow to make appointments for follow up since today was a Holiday. Foley cath education provided on how to clean, empty and position bag. Pt has no questions at this time. Medication at pharmacy and pts sister will be picking them up. There are no other needs and pt has no further questions. All important information highlighted as pt is forgetful. Sister understands and will oversee pts medication once home.

## 2020-11-13 NOTE — Discharge Instructions (Signed)
Acute Kidney Injury, Adult  Acute kidney injury is a sudden worsening of kidney function. The kidneys are organs that have several jobs. They filter the blood to remove waste products and extra fluid. They also maintain a healthy balance of minerals and hormones in the body, which helps control blood pressure and keep bones strong. With this condition, your kidneys do not do their jobs as well as they should. This condition ranges from mild to severe. Over time, it may develop into long-lasting (chronic) kidney disease. Early detection and treatment may prevent acute kidney injury from developing into a chronic condition. What are the causes? Common causes of this condition include:  A problem with blood flow to the kidneys. This may be caused by: ? Low blood pressure (hypotension) or shock. ? Blood loss. ? Heart and blood vessel (cardiovascular) disease. ? Severe burns. ? Liver disease.  Direct damage to the kidneys. This may be caused by: ? Certain medicines. ? A kidney infection. ? Poisoning. ? Being around or in contact with toxic substances. ? A surgical wound. ? A hard, direct hit to the kidney area.  A sudden blockage of urine flow. This may be caused by: ? Cancer. ? Kidney stones. ? An enlarged prostate in males. What increases the risk? You are more likely to develop this condition if you:  Are older than age 65.  Are male.  Are hospitalized, especially if you are in critical condition.  Have certain conditions, such as: ? Chronic kidney disease. ? Diabetes. ? Coronary artery disease and heart failure. ? Pulmonary disease. ? Chronic liver disease. What are the signs or symptoms? Symptoms of this condition may not be obvious until the condition becomes severe. Symptoms of this condition can include:  Tiredness (lethargy) or difficulty staying awake.  Nausea or vomiting.  Swelling (edema) of the face, legs, ankles, or feet.  Problems with urination, such  as: ? Pain in the abdomen, or pain along the side of your stomach (flank). ? Producing little or no urine. ? Passing urine with a weak flow.  Muscle twitches and cramps, especially in the legs.  Confusion or trouble concentrating.  Loss of appetite.  Fever. How is this diagnosed? Your health care provider can diagnose this condition based on your symptoms, medical history, and a physical exam.  You may also have other tests, such as:  Blood tests.  Urine tests.  Imaging tests.  A test in which a sample of tissue is removed from the kidneys to be examined under a microscope (kidney biopsy). How is this treated? Treatment for this condition depends on the cause and how severe the condition is. In mild cases, treatment may not be needed. The kidneys may heal on their own. In more severe cases, treatment will involve:  Treating the cause of the kidney injury. This may involve changing any medicines you are taking or adjusting your dosage.  Fluids. You may need specialized IV fluids to balance your body's needs.  Having a catheter placed to drain urine and prevent blockages.  Preventing problems from occurring. This may mean avoiding certain medicines or procedures that can cause further injury to the kidneys. In some cases, treatment may also require:  A procedure to remove toxic wastes from the body (dialysis or continuous renal replacement therapy, CRRT).  Surgery. This may be done to repair a torn kidney or to remove the blockage from the urinary system. Follow these instructions at home: Medicines  Take over-the-counter and prescription medicines only as   told by your health care provider.  Do not take any new medicines without your health care provider's approval. Many medicines can worsen your kidney damage.  Do not take any vitamin and mineral supplements without your health care provider's approval. Many nutritional supplements can worsen your kidney  damage. Lifestyle  If your health care provider prescribed changes to your diet, follow them. You may need to decrease the amount of protein you eat.  Achieve and maintain a healthy weight. If you need help with this, ask your health care provider.  Start or continue an exercise plan. Try to exercise at least 30 minutes a day, 5 days a week.  Do not use any products that contain nicotine or tobacco, such as cigarettes, e-cigarettes, and chewing tobacco. If you need help quitting, ask your health care provider.   General instructions  Keep track of your blood pressure. Report changes in your blood pressure as told by your health care provider.  Stay up to date with your vaccines. Ask your health care provider which vaccines you need.  Keep all follow-up visits as told by your health care provider. This is important.   Where to find more information  American Association of Kidney Patients: www.aakp.org  National Kidney Foundation: www.kidney.org  American Kidney Fund: www.akfinc.org  Life Options Rehabilitation Program: ? www.lifeoptions.org ? www.kidneyschool.org Contact a health care provider if:  Your symptoms get worse.  You develop new symptoms. Get help right away if:  You develop symptoms of worsening kidney disease, which include: ? Headaches. ? Abnormally dark or light skin. ? Easy bruising. ? Frequent hiccups. ? Chest pain. ? Shortness of breath. ? End of menstruation in women. ? Seizures. ? Confusion or altered mental status. ? Abdominal or back pain. ? Itchiness.  You have a fever.  Your body is producing less urine.  You have pain or bleeding when you urinate. Summary  Acute kidney injury is a sudden worsening of kidney function.  Acute kidney injury can be caused by problems with blood flow to the kidneys, direct damage to the kidneys, and sudden blockage of urine flow.  Symptoms of this condition may not be obvious until it becomes severe.  Symptoms may include edema, lethargy, confusion, nausea or vomiting, and problems passing urine.  This condition can be diagnosed with blood tests, urine tests, and imaging tests. Sometimes a kidney biopsy is done to diagnose this condition.  Treatment for this condition often involves treating the underlying cause. It is treated with fluids, medicines, diet changes, dialysis, or surgery. This information is not intended to replace advice given to you by your health care provider. Make sure you discuss any questions you have with your health care provider. Document Revised: 04/13/2019 Document Reviewed: 04/13/2019 Elsevier Patient Education  2021 Elsevier Inc.  

## 2020-11-13 NOTE — Progress Notes (Signed)
Physical Therapy Treatment Patient Details Name: [name] MRN: [id number] DOB: 1945-08-12 Today's Date: 11/13/2020    History of Present Illness 75 year old male presented to the ED 5/23 with progressive weight loss over the last 6 months and new onset fatigue, dyspnea on exertion and generalized weakness over the last week. Found to have gram-negative bacteremia and AKI and admitted. PMH:  AAA, CKD stage II, CAD, hypertension, colonic polyposis, gout, alcohol use disorder.    PT Comments    Patient progressing slowly towards PT goals. Tolerated gait training and stair training with Min guard-Min A for balance/safety. HR up to 122 bpm, A-fib with activity. Highly recommend use of RW for support and safety during ambulation as pt continues to be a high fall risk. Pt agreeable. Pt impulsive and demonstrates poor awareness of safety/deficits. Education on fall reduction techniques esp using RW. Adamantly refusing SNF so disposition updated to [facility] services.  Will continue to follow and progress as tolerated.    Follow Up Recommendations  Supervision - Intermittent;Home health PT (refusing SNF)     Equipment Recommendations  Rolling walker with 5" wheels    Recommendations for Other Services       Precautions / Restrictions Precautions Precautions: Fall Restrictions Weight Bearing Restrictions: No    Mobility  Bed Mobility Overal bed mobility: Needs Assistance Bed Mobility: Supine to Sit;Sit to Supine     Supine to sit: Supervision Sit to supine: Supervision   General bed mobility comments: No assist needed.    Transfers Overall transfer level: Needs assistance Equipment used: Rolling walker (2 wheeled) Transfers: Sit to/from Stand Sit to Stand: Min guard         General transfer comment: Min guard for safety. Stood from [employer]. Cues for hand placement.  Ambulation/Gait Ambulation/Gait assistance: Min assist;Min guard Gait Distance (Feet): 140 Feet Assistive  device: Rolling walker (2 wheeled) Gait Pattern/deviations: Step-through pattern;Trunk flexed;Shuffle;Decreased step length - right;Decreased step length - left Gait velocity: decreased Gait velocity interpretation: <1.31 ft/sec, indicative of household ambulator General Gait Details: Slow, mildly unsteady gait with feet hitting RW at times; difficulty with turns picking up RW. HR up to 122 bpm A-fib. Ditched RW when close to bed.   Stairs Stairs: Yes Stairs assistance: Min guard Stair Management: Alternating pattern;Two rails Number of Stairs: 3 General stair comments: Cues for technique/safety; slightly impulsive.   Wheelchair Mobility    Modified Rankin (Stroke Patients Only)       Balance Overall balance assessment: Needs assistance Sitting-balance support: Feet supported;No upper extremity supported Sitting balance-Leahy Scale: Normal     Standing balance support: During functional activity Standing balance-Leahy Scale: Fair Standing balance comment: Able to stand statically without UE support, does better with UE support for dynamic tasks/walking.                            Cognition Arousal/Alertness: Awake/alert Behavior During Therapy: WFL for tasks assessed/performed Overall Cognitive Status: No family/caregiver present to determine baseline cognitive functioning                                 General Comments: pt perseverating on the events leading up to hospitalization, "I don't even know why I am here." Appears to have low medical literacy as well poor awareness of safety, "I am fine"      Exercises      General Comments General comments (  skin integrity, edema, etc.): HR up to 122 bpm a-fib during activity.      Pertinent Vitals/Pain Pain Assessment: Faces Faces Pain Scale: Hurts a little bit Pain Location: throat Pain Descriptors / Indicators: Sore Pain Intervention(s): Monitored during session    Home Living                       Prior Function            PT Goals (current goals can now be found in the care plan section) Acute Rehab PT Goals Patient Stated Goal: get strength back, go home Progress towards PT goals: Progressing toward goals    Frequency    Min 3X/week      PT Plan Discharge plan needs to be updated    Co-evaluation              AM-PAC PT "6 Clicks" Mobility   Outcome Measure  Help needed turning from your back to your side while in a flat bed without using bedrails?: None Help needed moving from lying on your back to sitting on the side of a flat bed without using bedrails?: None Help needed moving to and from a bed to a chair (including a wheelchair)?: A Little Help needed standing up from a chair using your arms (e.g., wheelchair or bedside chair)?: A Little Help needed to walk in hospital room?: A Little Help needed climbing 3-5 steps with a railing? : A Little 6 Click Score: 20    End of Session Equipment Utilized During Treatment: Gait belt Activity Tolerance: Patient tolerated treatment well;Patient limited by fatigue Patient left: in bed;with call bell/phone within reach;with bed alarm set Nurse Communication: Mobility status PT Visit Diagnosis: Muscle weakness (generalized) (M62.81);Difficulty in walking, not elsewhere classified (R26.2);Pain Pain - part of body:  (throat)     Time: 1032-1050 PT Time Calculation (min) (ACUTE ONLY): 18 min  Charges:  $Gait Training: 8-22 mins                     Marisa Severin, PT, DPT Acute Rehabilitation Services Pager [phone] [place] 11/13/2020, 12:31 PM

## 2020-11-13 NOTE — TOC Transition Note (Signed)
Transition of Care [facility]) - CM/SW Discharge Note   Patient Details  Name: [name] MRN: [id number] Date of Birth: 09-10-1945  Transition of Care [facility]) CM/SW Contact:  Bartholomew Crews, RN Phone Number: [phone] 11/13/2020, 3:19 PM   Clinical Narrative:     Patient wanting to transition home instead of SNF. Spoke with patient at the bedside. Agreeable to [facility] - referral accepted by [facility]. Referral for RW accepted by [employer] - will deliver to patient's home tomorrow. Patient aware. No further TOC needs identified at this time.   Final next level of care: [place] Barriers to Discharge: No Barriers Identified   Patient Goals and CMS Choice Patient states their goals for this hospitalization and ongoing recovery are:: to go home CMS Medicare.gov Compare Post Acute Care list provided to:: Patient Choice offered to / list presented to : Patient  Discharge Placement                       Discharge Plan and Services In-house Referral: Clinical Social Work              DME Arranged: Gilford Rile rolling DME Agency: Other - Comment [facility]) Date DME Agency Contacted: 11/13/20 Time DME Agency Contacted: 43 Representative spoke with at DME Agency: Brenton Grills HH Arranged: PT,RN,OT [place] Date [place]: 11/13/20 Time [place]: 1430 Representative spoke with at [place]: [place] ([place]) Interventions     Readmission Risk Interventions No flowsheet data found.

## 2020-11-13 NOTE — Progress Notes (Signed)
Occupational Therapy Treatment Patient Details Name: [name] MRN: [id number] DOB: February 14, 1946 Today's Date: 11/13/2020    History of present illness 75 year old male presented to the ED 5/23 with progressive weight loss over the last 6 months and new onset fatigue, dyspnea on exertion and generalized weakness over the last week. Found to have gram-negative bacteremia and AKI and admitted. PMH:  AAA, CKD stage II, CAD, hypertension, colonic polyposis, gout, alcohol use disorder.   OT comments  Pt perseverating on events leading up to hospitalization, does not know why he is here, but agreed that it was a good idea to be admitted for a thorough workup. Pt supervised OOB with RW, primarily for managing IV and safety. He is overall functioning at set up to supervision level. He states his sister will provide meals when he first returns home. He is declining rehab in SNF, despite being told the benefits. Educated in fall prevention with pt verbalizing understanding and agrees to use of RW at home.  Agreed to [facility] services. Updated d/c recommendation.   Follow Up Recommendations  Home health OT    Equipment Recommendations   (RW)    Recommendations for Other Services      Precautions / Restrictions Precautions Precautions: Fall       Mobility Bed Mobility Overal bed mobility: Independent             General bed mobility comments: HOB flat    Transfers Overall transfer level: Modified independent Equipment used: Rolling walker (2 wheeled)             General transfer comment: Pt agrees he needs to use RW when he returns home.    Balance Overall balance assessment: Needs assistance   Sitting balance-Leahy Scale: Normal       Standing balance-Leahy Scale: Fair                             ADL either performed or assessed with clinical judgement   ADL Overall ADL's : Needs assistance/impaired     Grooming: Supervision/safety;Standing;Wash/dry  hands     Upper Body Bathing Details (indicate cue type and reason): recommended long handled bath sponge Lower Body Bathing: Set up;Sit to/from stand       Lower Body Dressing: Set up;Sit to/from stand   Toilet Transfer: Supervision/safety;RW;Ambulation   Toileting- Water quality scientist and Hygiene: Modified independent;Sit to/from stand       Functional mobility during ADLs: Supervision/safety;Rolling walker General ADL Comments: pt plans to sponge bathe, has a riser on his toilet at home, educated in fall prevention     Vision       Perception     Praxis      Cognition Arousal/Alertness: Awake/alert Behavior During Therapy: WFL for tasks assessed/performed;Flat affect Overall Cognitive Status: No family/caregiver present to determine baseline cognitive functioning                                 General Comments: pt perseverating on the events leading up to hospitalization, "I don't even know why I am here." Appears to have low medical literacy        Exercises     Shoulder Instructions       General Comments      Pertinent Vitals/ Pain       Pain Assessment: No/denies pain  Home Living  Prior Functioning/Environment              Frequency  Min 2X/week        Progress Toward Goals  OT Goals(current goals can now be found in the care plan section)  Progress towards OT goals: Progressing toward goals  Acute Rehab OT Goals Patient Stated Goal: get strength back, go home OT Goal Formulation: With patient Time For Goal Achievement: 11/22/20 Potential to Achieve Goals: Good  Plan Discharge plan needs to be updated    Co-evaluation                 AM-PAC OT "6 Clicks" Daily Activity     Outcome Measure   Help from another person eating meals?: None Help from another person taking care of personal grooming?: A Little Help from another person toileting, which  includes using toliet, bedpan, or urinal?: A Little Help from another person bathing (including washing, rinsing, drying)?: A Little Help from another person to put on and taking off regular upper body clothing?: None Help from another person to put on and taking off regular lower body clothing?: None 6 Click Score: 21    End of Session Equipment Utilized During Treatment: Rolling walker;Gait belt  OT Visit Diagnosis: Unsteadiness on feet (R26.81);Other abnormalities of gait and mobility (R26.89);Muscle weakness (generalized) (M62.81);Other symptoms and signs involving cognitive function   Activity Tolerance Patient tolerated treatment well   Patient Left in bed;with call bell/phone within reach;with chair alarm set   Nurse Communication          Time: 8676-1950 OT Time Calculation (min): 27 min  Charges: OT General Charges $OT Visit: 1 Visit OT Treatments $Self Care/Home Management : 23-37 mins Nestor Lewandowsky, OTR/L Acute Rehabilitation Services Pager: [phone] Office: [phone]  Malka So 11/13/2020, 11:45 AM

## 2020-11-14 NOTE — Discharge Summary (Signed)
Name: [name] MRN: [id number] DOB: October 04, 1945 75 y.o. PCP: Axel Filler, MD  Date of Admission: 11/06/2020 12:17 PM Date of Discharge: 11/13/2020 Attending Physician: Dr. Velna Ochs   Discharge Diagnosis: 1. Enterobacter Sakazakii Bacteremia  2. Unintentional Weight Loss w/ Protein-Calorie Malnutrition + Physical Deconditioning 3. Familial Adenomatous Polyposis  4. Multiple Sessile Colonic Polyps  5. Side Branch Pancreatic Ectasia and/or IPMN's 6. Possible Pancreatic Insufficiency  7. Multiple Aortic Aneurysms  8. Thoracic Aortic Ulcer 9. Non-Obstructing Portal Vein Thrombosis  10. 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 Second Degree AV Block Type I  12. LAFB 13. HFpEF w/ mild LVH and G1DD  14. Triple Vessel (including LM) CAD 15. Hepatic Steatosis (F3-F4)  16. Cholelithiasis  17. Hx of Alcohol Use Disorder  18. TUD  19. AKI on CKD stage II 20. Thrombocytopenia  21. Hypophosphatemia   Discharge Medications: Allergies as of 11/13/2020      Reactions   Candesartan Cilexetil-hctz Other (See Comments)   Acute renal failure   Hydrochlorothiazide Other (See Comments)   Acute renal failure   Shellfish Allergy Other (See Comments)   Gout occurs      Medication List    STOP taking these medications   amLODipine 10 MG tablet Commonly known as: NORVASC   aspirin EC 81 MG tablet   docusate sodium 100 MG capsule Commonly known as: COLACE   metoprolol succinate 100 MG 24 hr tablet Commonly known as: TOPROL-XL   OVER THE COUNTER MEDICATION   polyethylene glycol 17 g packet Commonly known as: MIRALAX / GLYCOLAX     TAKE these medications   albuterol 108 (90 Base) MCG/ACT inhaler Commonly known as: VENTOLIN HFA Inhale 1-2 puffs into the lungs every 6 (six) hours as needed for wheezing or shortness of breath. What changed: how much to take   allopurinol 100 MG tablet Commonly known as: ZYLOPRIM Take 1 tablet (100 mg  total) by mouth at bedtime.   HYDROcodone-acetaminophen 7.5-325 MG tablet Commonly known as: NORCO Take 1 tablet by mouth 2 (two) times daily as needed for moderate pain. What changed: when to take this   multivitamin with minerals Tabs tablet Take 1 tablet by mouth daily.   Pancrelipase (Lip-Prot-Amyl) 20000-63000 units Cpep Take 60,000 Units by mouth 3 (three) times daily before meals.   rosuvastatin 20 MG tablet Commonly known as: CRESTOR TAKE 1 TABLET EVERY DAY What changed: when to take this   sulfamethoxazole-trimethoprim 800-160 MG tablet Commonly known as: BACTRIM DS Take 1 tablet by mouth every 12 (twelve) hours.   tamsulosin 0.4 MG Caps capsule Commonly known as: FLOMAX TAKE 2 CAPSULES (0.8 MG TOTAL) BY MOUTH EVERY EVENING. What changed: when to take this       Disposition and follow-up:   Mr.Zacory L Hellberg was discharged from [facility] in Stable condition.  At the hospital follow up visit please address:  Please Follow Up:  Enterobacter Sakazakii Bacteremia - patient was unable to receive last dose of PO bactrim on day of discharge. Please assess for fever recurrence and f/u cultures. Unintentional Weight Loss w/ Protein-Calorie Malnutrition + Physical Deconditioning - monitor PO intake and provide dietary education / supplementation.  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 Follow up biopsy results and ensure he follows up with GI.  Side Branch Pancreatic Ectasia and/or IPMN's - Heme/onc do not believe he has overt malignancy, although he  would benefit from EUS of the pancreas w/ bx if possible given CT findings. GI follow up.  Possible Pancreatic Insufficiency - Assess response to CREON started on admission.  Multiple Aortic Aneurysms/Aortic Ulcer - Aneurysms were stable (aside from aortic ulcer). Vascular recommend routine follow up. Non-Obstructive Portal Vein  Thrombosis - Vascular surgery recommend therapeutic anticoagulation although heme/onc cautioned against this due to presumed elevated risk of bleeding. Given drop in hemoglobin during hospitalization, was discharged without ASA or anticoagulation. Please consider vascular surgery follow up.  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 Iron studies consistent with AOCD. With aortic ulcer noted and high risk of cancer, please continue monitoring.  Second Degree AV Block Type I w/LAFB and hx RBBB - Metoprolol was discontinued due to progression of heart block with bradycardia (which improved prior to discharge).  Triple Vessel (including LM) CAD - Cardiology recommend medical management. Continued Crestor at current dose (lipid panel OK) although held off on adding ASA 81mg  daily due to concern for slow bleed. Please resume if Hgb stable.  Hepatic Steatosis (F3-F4) / Hx of AUD - Would benefit from GI follow up and monitoring of hypoalbuminemia and thrombocytosis as well as ongoing discussions regarding importance of alcohol cessation.  Cholelithiasis - Does appear chronic and may be contributing some to his anorexia, although had no signs of cholecystitis. GI follow up.  TUD - Please continue cessation discussions. Hypophosphatemia - Phos on admission was <1 and continued to drop despite replacement. Continue close monitoring of electrolytes.  Hx of HTN - Home antihypertensives were held on admission and discharge due to intermittent hypotension and soft pressures. Continue to monitor.   2.  Labs / imaging needed at time of follow-up: CMP, Phos, Mag, CBC, EUS Pancreas (+/- Bx)  3.  Pending labs/ test needing follow-up: Bx Results (Endoscopies)   Follow-up Appointments:  Follow-up Information    Axel Filler,  MD. Schedule an appointment as soon as possible for a visit in 2 week(s).   Specialty: Internal Medicine Why: Please call to schedule a hospital follow up visit with [facility] in about 2 weeks.  Contact information: [place] [address] [phone]        Bjorn Loser, MD. Schedule an appointment as soon as possible for a visit in 1 week(s).   Specialty: Urology Why: Please call to schedule an appointment with your urologist as soon as possible for foley removal and discussion of urinary retention.  Contact information: [place] [address] [phone]        Angelia Mould, MD. Schedule an appointment as soon as possible for a visit in 1 day(s).   Specialties: Vascular Surgery, Cardiology Why: Please schedule a follow up appointment with your vascular surgeon as soon as possible to discuss restarting anticoagulation in the setting of your heart/vascular disease. Contact information: [address] [place] [address] [phone]        [name], MD. Schedule an appointment as soon as possible for a visit in 1 week(s).   Specialty: Gastroenterology Why: Please call to schedule a hospital follow up appointment with Dr. [name] for biopsy of your pancreas to rule out cancer and for ongoing monitoring.  Contact information: [address] [place] [address] [phone]  [facility] Follow up.   Specialty: Home Health Services Why: the office will call to schedule home health visits Contact information: [address] [place] [address] [phone]               Hospital Course by problem list:  Enterobacter Sakazakii Bacteremia Patient initially presented with hypotension and had intermittent bradycardia on exam, although was otherwise HDS without leukocytosis or WBC. It is thought his initial hypotension and bradycardia were more-so medication-induced as pressures remained  low with treatment of his bacteremia and with holding of home medications. 3/4 blood cultures grew enterobacter sakazakii. ID were consulted who believe source was either urinary (unfortunately first culture grew multiple species and second was sterile) vs. Most likely due to gut translocation. He was treated with IV cefepime and then transitioned to Bactrim prior to discharge for a total of 6.5 days of ABX. He unfortunately had not received his evening dose day of discharge as his pharmacy was closed.   Unintentional weight loss/physical deconditioning  Concerns for GI malignancy vs exocrine pancreatic insufficiency  Patient endorsed a history of 60lb weight loss over the past 2-3 years despite eating as well as he had previously. While his bacteremia may have caused acute anorexia he experienced for 1 week prior to admission, and he does have COPD and severe hepatic steatosis (F3-F4) with thrombocytopenia and hypoalbuminemia and hx AUD concerning for cirrhosis [both of which could cause weight loss] he did not seem to have severe COPD exacerbations or clear evidence of cirrhosis on imaging and says he stopped drinking alcohol recently. He does have a history of remote lung cancer s/p right upper lobectomy as well as colonic polyposis thought to be due to FAP. However, CT chest 08/2020 did not show any suspicious pulmonary findings and he has not had obvious malignancy on colonoscopies in the past. Lipase was elevated to 65 on admission and he had mild AST elevation throughout admission. CA 19-9 was slightly elevated at 113 although CEA and AFP were normal. CT Abdomen Pelvis showed an ill-defined hypovascular area over the head and uncinate process of the pancreas with some calcification, concerning for malignancy and possible chronic pancreatitis. Follow-up MRI / MRCP was significantly motion-degraded, although did not show any discrete pancreatic neoplasm. There were multiple tiny cystic areas (66mm max)  consistent with side branch ectasia and/or multiple side branch IPMN's. Oncology evaluated the patient and didn't believe that he has active pancreatic or current lung cancer (although cannot prove this without a biopsy of course). They recommended GI consult for EUS pancreatic bx. GI were consulted and performed EGD and colonoscopy, which showed duodenal mucosal changes of the papilla with edema of the duodenal bulb and prominent ampulla and 5 sub-centimeter colonic polyps although did not show any frank malignancies. GI initiated CREON 60,000 units TID for presumed pancreatic insufficiency and recommended follow up outpatient with Dr. Silvano Rusk for EUS of the pancreas + passage of duodenoscope to evaluate/bx the major papilla. Biopsies were taken and are pending. He was discharged home for continued outpatient workup (antihypertensives were held on discharge which also may have been contributing).   Multiple aortic aneurysms Non-obstructive portal vein thrombosis Acute on chronic hypoproliferative macrocytic anemia  Anemia of chronic disease  CT abdomen obtained on admission was notable for multiple aortic aneurysmal dilations as well as a thoracic aortic ulcer. These aneurysms have remained stable as compared to imaging within the past year. Vascular surgery were consulted who did recommend  therapeutic anticoagulation for non-obstructive portal vein thrombosis believing this would be unlikely without an acute trigger; however, heme/onc weren't sure if this would be in his best interest due to high risk of bleeding (and believed this to be more chronic). Anticoagulation was quickly stopped due to this concern and ASA and anticoagulation were held at discharge as his hemoglobin did drop 2 points on admission from baseline and another 2 pts during admission (although more likely due to dilution as iron studies were unremarkable and he did not have any obvious source of bleeding during stay. Will need  vascular surgery follow up.   Second degree AV block Type I w/ LAFB  HFpEF w/G1DD Patient was evaluated by cardiology due to progression of heart blocks noted on EKG. He was found to have irregular rhythms as noted above associated with intermittent bradycardia. He was on high dose Metoprolol at home, which was titrating down and had to be discontinued on discharge due to bradycardia / block. He did have a history of moderately reduced EF although only had G1DD during admission and was euvolemic.   Triple Vessel CAD Patient was found to have severe triple vessel CAD including LM disease on CT scan. He did not have any concerning anginal symptoms and cardiology recommended medical management. His lipid panel was unremarkable and he was started back on home Crestor 20mg  although ASA was held due to concern for bleeding and should likely be restarted on discharge.   Hepatic Steatosis Hx AUD  AST remains persistently mildly elevated although other enzymes and bilirubin are WNL. He does have thrombocytopenia dn low albumin with F3-F4 concerning for severe liver fibrosis vs. Early cirrhosis. He says he stopped drinking >1 month ago and did not require Ativan on CIWA although would benefit with outpatient follow up and continued cessation discussions.   Discharge Exam:   BP (!) 101/57 (BP Location: Left Arm)   Pulse 83   Temp 98 F (36.7 C) (Oral)   Resp 18   Ht 6\' 1"  (1.854 m)   Wt 61.2 kg   SpO2 100%   BMI 17.81 kg/m  Discharge exam: Please see progress note from today for subjective + objective information including PE.   Pertinent Labs, Studies, and Procedures:   Please see Hospital Course for relevant labs.   CT ABDOMEN PELVIS W CONTRAST  Result Date: 11/07/2020 CLINICAL DATA:  75 year old male with history of anemia and unintended weight loss. EXAM: CT ABDOMEN AND PELVIS WITH CONTRAST TECHNIQUE: Multidetector CT imaging of the abdomen and pelvis was performed using the standard  protocol following bolus administration of intravenous contrast. CONTRAST:  182mL OMNIPAQUE IOHEXOL 300 MG/ML  SOLN COMPARISON:  04/02/2018.  CT the abdomen and pelvis FINDINGS: Lower chest: Atherosclerotic calcifications in the thoracic aorta as well as the left main, left anterior descending, left circumflex and right coronary arteries. Hepatobiliary: No suspicious cystic or solid hepatic lesions. No intra or extrahepatic biliary ductal dilatation. Partially calcified gallstone measuring 1.5 cm in diameter in the neck of the gallbladder which is nearly decompressed. No pericholecystic fluid or inflammatory changes. Pancreas: Ill-defined hypovascular area in the head and uncinate process of the pancreas (axial image 40 of series 3), concerning for potential pancreatic mass. There are some calcifications in this region as well. Body and tail of the pancreas are otherwise normal in appearance. No pancreatic ductal dilatation. No peripancreatic fluid collections or inflammatory changes. Spleen: Unremarkable. Adrenals/Urinary Tract: Multiple low-attenuation lesions in the kidneys bilaterally, compatible with simple cysts, largest of  which is in the upper pole of the right kidney measuring 4.6 cm in diameter. Other subcentimeter low-attenuation lesions in both kidneys are too small to definitively characterize, but are favored to represent tiny cysts. Bilateral adrenal glands are normal in appearance. No hydroureteronephrosis. Urinary bladder is nearly decompressed around an indwelling Foley balloon catheter, but is otherwise unremarkable in appearance. Stomach/Bowel: The appearance of the stomach is normal. There is no pathologic dilatation of small bowel or colon. Numerous colonic diverticulae are noted, without surrounding inflammatory changes to suggest an acute diverticulitis at this time. Normal appendix. Vascular/Lymphatic: Aortic atherosclerosis with fusiform aneurysmal dilatation of the infrarenal abdominal  aorta which measures up to 5.3 x 5.2 cm in diameter. There is also aneurysmal dilatation of the right common iliac artery which measures 2.0 cm in diameter. Small penetrating ulcer noted at the level of the aortic hiatus (axial image 17 of series 3). Small filling defect in the proximal portal vein (axial image 32 of series 3) compatible with thrombus. No lymphadenopathy noted in the abdomen or pelvis. Reproductive: Prostate gland and seminal vesicles are unremarkable in appearance. Other: No significant volume of ascites.  No pneumoperitoneum. Musculoskeletal: There are no aggressive appearing lytic or blastic lesions noted in the visualized portions of the skeleton. IMPRESSION: 1. Ill-defined hypovascular area in the head of the pancreas concerning for potential pancreatic neoplasm. Further evaluation with nonemergent abdominal MRI with and without IV gadolinium with MRCP is recommended in the near future to better evaluate this finding. 2. 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 3. Cholelithiasis without evidence of acute cholecystitis at this time. 4. Aortic atherosclerosis, in addition to left main and 3 vessel coronary artery disease. In addition, there is a small penetrating ulcer of the anterior aspect of the distal descending thoracic aorta at the level of the aortic hiatus, fusiform aneurysmal dilatation of the infrarenal abdominal aorta which measures up to 5.3 x 5.2 cm in diameter, and aneurysmal dilatation of the right common iliac artery which measures 2.0 cm in diameter. Recommend follow-up CT/MR every 6 months and vascular consultation. This recommendation follows ACR consensus guidelines: White Paper of the ACR Incidental Findings Committee II on Vascular Findings. J Am Coll Radiol 2013; 10:789-794. 5. Colonic diverticulosis without evidence of acute diverticulitis at this time. 6. Additional incidental findings, as above. Electronically Signed   By: Vinnie Langton M.D.   On: 11/07/2020  20:18   MR 3D Recon At Scanner  Result Date: 11/08/2020 CLINICAL DATA:  75 year old male with history of possible pancreatic head mass noted on prior CT examination. Follow-up study. EXAM: MRI ABDOMEN WITHOUT AND WITH CONTRAST (INCLUDING MRCP) TECHNIQUE: Multiplanar multisequence MR imaging of the abdomen was performed both before and after the administration of intravenous contrast. Heavily T2-weighted images of the biliary and pancreatic ducts were obtained, and three-dimensional MRCP images were rendered by post processing. CONTRAST:  72mL GADAVIST GADOBUTROL 1 MMOL/ML IV SOLN COMPARISON:  No prior abdominal MRI. CT the abdomen and pelvis 11/07/2020. FINDINGS: Comment: Today's study is limited by large amount of patient motion artifact. Lower chest: Unremarkable. Hepatobiliary: No discrete cystic or solid hepatic lesions are confidently identified on today's motion limited examination. No intra or extrahepatic biliary ductal dilatation noted on MRCP images. Common bile duct measures 3-4 mm in the porta hepatis. No filling defects in the common bile duct to suggest choledocholithiasis. Filling defects are present in the gallbladder, compatible with cholelithiasis. Gallbladder is not distended. No gallbladder wall thickening or pericholecystic fluid or inflammatory changes. Pancreas:  No discrete pancreatic mass is confidently identified in the head of the pancreas or uncinate process. Throughout this region there are multiple tiny cystic areas largest of which measure only 6 mm and what appears to be side branch ectasia, without a well-defined larger cystic mass. No pancreatic ductal dilatation noted on MRCP images. No peripancreatic fluid collections or inflammatory changes. Spleen:  Unremarkable. Adrenals/Urinary Tract: Multiple T1 hypointense, T2 hyperintense, nonenhancing lesions are noted in both kidneys, compatible with simple cysts, largest of which is in the medial aspect of the upper pole of the right  kidney measuring 5.1 x 4.6 cm. In addition, in the posterior aspect of the upper pole the left kidney there is a 2.4 x 1.9 cm T1 hyperintense, T2 hypointense, nonenhancing lesion, compatible with a proteinaceous/hemorrhagic cyst. No hydroureteronephrosis in the visualized portions of the abdomen. Bilateral adrenal glands are normal in appearance. Stomach/Bowel: Visualized portions are unremarkable. Vascular/Lymphatic: No known infrarenal abdominal aortic aneurysm is only partially imaged, better demonstrated on yesterday's CT the abdomen and pelvis. Likewise, the filling defect in the proximal portal vein is evident on today's study, but better demonstrated on yesterday's CT examination. No lymphadenopathy noted in the abdomen. Other: No significant volume of ascites noted in the visualized portions of the peritoneal cavity. Musculoskeletal: No aggressive appearing osseous lesions are noted in the visualized portions of the skeleton. IMPRESSION: 1. No definite pancreatic mass confidently identified. 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 Recommend follow up pre and post contrast MRI/MRCP or pancreatic protocol CT in 2 years. This recommendation follows ACR consensus guidelines: Management of Incidental Pancreatic Cysts: A White Paper of the ACR Incidental Findings Committee. J Am Coll Radiol 2017;14:911-923. 2. 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 3. Nonocclusive portal vein thrombus again noted, but better demonstrated on yesterday's CT examination. 4. Infrarenal abdominal aortic aneurysm better demonstrated on yesterday's CT examination which measured up to 5.3 x 5.2 cm in diameter. Electronically Signed   By: Vinnie Langton M.D.   On: 11/08/2020 12:26   DG Chest Portable 1 View  Result Date:  11/06/2020 CLINICAL DATA:  Shortness of breath with exertion over the last 4 days. EXAM: PORTABLE CHEST 1 VIEW COMPARISON:  04/08/2008.  CT 08/29/2020. FINDINGS: Previous right upper lobectomy. Heart size is normal. Chronic aortic atherosclerotic calcification. Residual lung appears clear. No evidence of edema, infiltrate, collapse or effusion. IMPRESSION: Previous right upper lobectomy.  No active disease. Aortic atherosclerosis. Electronically Signed   By: Nelson Chimes M.D.   On: 11/06/2020 13:48   MR ABDOMEN MRCP W WO CONTAST  Result Date: 11/08/2020 CLINICAL DATA:  75 year old male with history of possible pancreatic head mass noted on prior CT examination. Follow-up study. EXAM: MRI ABDOMEN WITHOUT AND WITH CONTRAST (INCLUDING MRCP) TECHNIQUE: Multiplanar multisequence MR imaging of the abdomen was performed both before and after the administration of intravenous contrast. Heavily T2-weighted images of the biliary and pancreatic ducts were obtained, and three-dimensional MRCP images were rendered by post processing. CONTRAST:  17mL GADAVIST GADOBUTROL 1 MMOL/ML IV SOLN COMPARISON:  No prior abdominal MRI. CT the abdomen and pelvis 11/07/2020. FINDINGS: Comment: Today's study is limited by large amount of patient motion artifact. Lower chest: Unremarkable. Hepatobiliary: No discrete cystic or solid hepatic lesions are confidently identified on today's motion limited examination. No intra or extrahepatic biliary ductal dilatation  noted on MRCP images. Common bile duct measures 3-4 mm in the porta hepatis. No filling defects in the common bile duct to suggest choledocholithiasis. Filling defects are present in the gallbladder, compatible with cholelithiasis. Gallbladder is not distended. No gallbladder wall thickening or pericholecystic fluid or inflammatory changes. Pancreas: No discrete pancreatic mass is confidently identified in the head of the pancreas or uncinate process. Throughout this region there are  multiple tiny cystic areas largest of which measure only 6 mm and what appears to be side branch ectasia, without a well-defined larger cystic mass. No pancreatic ductal dilatation noted on MRCP images. No peripancreatic fluid collections or inflammatory changes. Spleen:  Unremarkable. Adrenals/Urinary Tract: Multiple T1 hypointense, T2 hyperintense, nonenhancing lesions are noted in both kidneys, compatible with simple cysts, largest of which is in the medial aspect of the upper pole of the right kidney measuring 5.1 x 4.6 cm. In addition, in the posterior aspect of the upper pole the left kidney there is a 2.4 x 1.9 cm T1 hyperintense, T2 hypointense, nonenhancing lesion, compatible with a proteinaceous/hemorrhagic cyst. No hydroureteronephrosis in the visualized portions of the abdomen. Bilateral adrenal glands are normal in appearance. Stomach/Bowel: Visualized portions are unremarkable. Vascular/Lymphatic: No known infrarenal abdominal aortic aneurysm is only partially imaged, better demonstrated on yesterday's CT the abdomen and pelvis. Likewise, the filling defect in the proximal portal vein is evident on today's study, but better demonstrated on yesterday's CT examination. No lymphadenopathy noted in the abdomen. Other: No significant volume of ascites noted in the visualized portions of the peritoneal cavity. Musculoskeletal: No aggressive appearing osseous lesions are noted in the visualized portions of the skeleton. IMPRESSION: 1. No definite pancreatic mass confidently identified. 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 Recommend follow up pre and post contrast MRI/MRCP or pancreatic protocol CT in 2 years. This recommendation follows ACR consensus guidelines: Management of Incidental Pancreatic Cysts: A White Paper of the ACR Incidental Findings  Committee. J Am Coll Radiol 2017;14:911-923. 2. 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 3. Nonocclusive portal vein thrombus again noted, but better demonstrated on yesterday's CT examination. 4. Infrarenal abdominal aortic aneurysm better demonstrated on yesterday's CT examination which measured up to 5.3 x 5.2 cm in diameter. Electronically Signed   By: Vinnie Langton M.D.   On: 11/08/2020 12:26   ECHOCARDIOGRAM COMPLETE  Result Date: 11/06/2020    ECHOCARDIOGRAM REPORT   Patient Name:   [name] Date of Exam: 11/06/2020 Medical Rec #:  [id number]       Height:       72.0 in Accession #:    [id number]      Weight:       147.5 lb Date of Birth:  [birth year]       BSA:          1.872 m Patient Age:    18 years        BP:           103/68 mmHg Patient Gender: M               HR:           78 bpm. Exam Location:  Inpatient Procedure: 2D Echo, Cardiac Doppler, Color Doppler and Intracardiac            Opacification Agent STAT ECHO  Indications:    R01.1 Murmur  History:        Patient has prior history of Echocardiogram examinations, most                 recent 04/30/2006.  Sonographer:    Merrie Roof RDCS Referring Phys: [id number] ERIC C KATZ  Sonographer Comments: Technically difficult study due to poor echo windows. IMPRESSIONS  1. Left ventricular ejection fraction, by estimation, is 55 to 60%. The left ventricle has normal function. Left ventricular endocardial border not optimally defined to evaluate regional wall motion. There is mild left ventricular hypertrophy. Left ventricular diastolic parameters are consistent with Grade I diastolic dysfunction (impaired relaxation).  2. Right ventricular systolic function was not well visualized. The right ventricular size is not well visualized. There is normal pulmonary artery systolic pressure. The estimated right ventricular systolic pressure is 00.8 mmHg.  3. The mitral valve is normal in structure. No evidence  of mitral valve regurgitation. No evidence of mitral stenosis.  4. The aortic valve is tricuspid. Aortic valve regurgitation is not visualized. No aortic stenosis is present.  5. The inferior vena cava is normal in size with greater than 50% respiratory variability, suggesting right atrial pressure of 3 mmHg.  6. Technically difficult study with poor acoustic windows. FINDINGS  Left Ventricle: Left ventricular ejection fraction, by estimation, is 55 to 60%. The left ventricle has normal function. Left ventricular endocardial border not optimally defined to evaluate regional wall motion. Definity contrast agent was given IV to delineate the left ventricular endocardial borders. The left ventricular internal cavity size was normal in size. There is mild left ventricular hypertrophy. Left ventricular diastolic parameters are consistent with Grade I diastolic dysfunction (impaired relaxation). Right Ventricle: The right ventricular size is not well visualized. Right vetricular wall thickness was not well visualized. Right ventricular systolic function was not well visualized. There is normal pulmonary artery systolic pressure. The tricuspid regurgitant velocity is 2.59 m/s, and with an assumed right atrial pressure of 3 mmHg, the estimated right ventricular systolic pressure is 67.6 mmHg. Left Atrium: Left atrial size was not well visualized. Right Atrium: Right atrial size was not well visualized. Pericardium: Trivial pericardial effusion is present. Mitral Valve: The mitral valve is normal in structure. No evidence of mitral valve regurgitation. No evidence of mitral valve stenosis. Tricuspid Valve: The tricuspid valve is normal in structure. Tricuspid valve regurgitation is trivial. Aortic Valve: The aortic valve is tricuspid. Aortic valve regurgitation is not visualized. No aortic stenosis is present. Pulmonic Valve: The pulmonic valve was normal in structure. Pulmonic valve regurgitation is not visualized. Aorta:  The aortic root was not well visualized. Venous: The inferior vena cava is normal in size with greater than 50% respiratory variability, suggesting right atrial pressure of 3 mmHg. IAS/Shunts: The interatrial septum was not well visualized.  LEFT VENTRICLE PLAX 2D LVIDd:         3.40 cm LVIDs:         2.90 cm LV PW:         1.10 cm LV IVS:        1.20 cm  LEFT ATRIUM         Index LA diam:    5.00 cm 2.67 cm/m   AORTA Ao Root diam: 3.40 cm MITRAL VALVE               TRICUSPID VALVE MV Area (PHT): 2.08 cm    TR Peak grad:   26.8 mmHg MV Decel Time:  364 msec    TR Vmax:        259.00 cm/s MV E velocity: 53.60 cm/s MV A velocity: 68.60 cm/s MV E/A ratio:  0.78 Loralie Champagne MD Electronically signed by Loralie Champagne MD Signature Date/Time: 11/06/2020/9:00:08 PM    Final    US Abdomen Limited RUQ (LIVER/GB)  Result Date: 11/06/2020 CLINICAL DATA:  Transaminitis. Patient is not NPO for the current exam. EXAM: ULTRASOUND ABDOMEN LIMITED RIGHT UPPER QUADRANT COMPARISON:  Noncontrast abdominopelvic CT 04/02/2020 FINDINGS: Gallbladder: Physiologically distended containing intraluminal sludge and stones. No gallbladder wall thickening. Trace free fluid adjacent to the fundus. No sonographic Murphy sign noted by sonographer. Common bile duct: Diameter: 3-4 mm, normal. Liver: Heterogeneously increased in parenchymal echogenicity. Technically limited evaluation of the liver due to shadowing bowel gas. No evidence of focal lesion. Portal vein is patent on color Doppler imaging with normal direction of blood flow towards the liver. Other: None. IMPRESSION: 1. Heterogeneous increased hepatic parenchymal echogenicity typical of steatosis. 2. Stones and sludge within the gallbladder. There is trace pericholecystic fluid, but no other findings of acute cholecystitis. No biliary dilatation. Electronically Signed   By: Keith Rake M.D.   On: 11/06/2020 21:13   Discharge Instructions: Discharge Instructions    (HEART FAILURE  PATIENTS) Call MD:  Anytime you have any of the following symptoms: 1) 3 pound weight gain in 24 hours or 5 pounds in 1 week 2) shortness of breath, with or without a dry hacking cough 3) swelling in the hands, feet or stomach 4) if you have to sleep on extra pillows at night in order to breathe.   Complete by: As directed    Call MD for:  difficulty breathing, headache or visual disturbances   Complete by: As directed    Call MD for:  extreme fatigue   Complete by: As directed    Call MD for:  persistant dizziness or light-headedness   Complete by: As directed    Call MD for:  persistant nausea and vomiting   Complete by: As directed    Call MD for:  severe uncontrolled pain   Complete by: As directed    Call MD for:  temperature >100.4   Complete by: As directed    Diet - low sodium heart healthy   Complete by: As directed    Discharge instructions   Complete by: As directed    [name], [name] were admitted to the hospital for workup of your decreased appetite, on-going weight loss, and severe persistent fatigue and generalized weakness. You were found to have an irregular and slow heart rhythm and low blood pressure early in your admission, which may have been contributing, although cardiology did not see anything require acute attention aside from discontinuation of all your blood pressure medications. You were found to have a bloodstream infection (Enterobacter Sakazakii), thought to originate from your gut vs. From a urinary tract infection. You were treated initially with IV antibiotics and transitioned to Bactrim (for one more dose) on discharge. Your images and endoscopies performed in the hospital did show benign-appearing changes to your pancreas, non-bleeding stomach ulcers, 5 colonic polyps, and a blood clot in one of the blood vessels that supplies blood to your liver, as well as known fatty disease of your liver (likely due to long history of heavy alcohol use placing you at risk  of cirrhosis), enlargement of your prostate, and aneurysms of your aorta.   You were also evaluated by infectious disease, gastroenterology, oncology,  and vascular surgery. It is thought that you likely do not have any overt malignancy (cancer) to explain your chronic weight loss, although you will need to schedule follow up visits with gastroenterology (Dr. [name]) for an outpatient upper endoscopy with ultrasound and possible biopsy of your pancreas to rule out pancreatic cancer, an appointment with vascular surgery for monitoring of your aortic aneurysms and treatment of your portal vein thrombosis (blood clot to your liver), urology for removal of your foley and ongoing evaluation of your prostatic enlargement, and with your primary care doctor [facility]) within the next couple of weeks for hospital follow up.   When you leave the hospital, please make the following changes to your medications:  - START taking CREON 60,000 units three times daily before each meal  - START taking your last dose of Bactrim this evening (before bed)  - STOP taking aspirin for now (may increase your risk of bleeding - will likely be restarted by physician outpatient) - STOP taking amlodipine  - STOP taking Toprol (metoprolol)  - I have ordered the medications you will need to take as well as a new albuterol inhaler (only as needed for shortness of breath, wheezing, or chest tightness) that you will need to pick up today.  - Only use Miralax and/or Dulcolax daily as needed for constipation / hard stools   I have ordered a walker that will be delivered to your home as well as home health physical therapy, occupational therapy, and nursing, to assist you with getting stronger and with education and monitoring of your conditions temporarily once you leave the hospital.   If you experience recurrence of fevers, chills, worsening shortness of breath, persistent dizziness, fatigue, generalized weakness, new or worsening chest  pain, abdominal pain, nausea, vomiting, constipation, diarrhea, or any other concerns, please call your primary care doctor [phone]) or go to the ED for evaluation.   It was a pleasure meeting you and take care,  Dr. Konrad Penta   Increase activity slowly   Complete by: As directed       Signed: Jeralyn Bennett, MD 11/14/2020, 7:38 PM   Pager: [phone]

## 2020-11-15 ENCOUNTER — Telehealth: Payer: Self-pay

## 2020-11-15 ENCOUNTER — Other Ambulatory Visit: Payer: Self-pay

## 2020-11-15 ENCOUNTER — Encounter: Payer: Self-pay | Admitting: Gastroenterology

## 2020-11-15 DIAGNOSIS — M25551 Pain in right hip: Secondary | ICD-10-CM

## 2020-11-15 DIAGNOSIS — Z79891 Long term (current) use of opiate analgesic: Secondary | ICD-10-CM

## 2020-11-15 DIAGNOSIS — M25552 Pain in left hip: Secondary | ICD-10-CM

## 2020-11-15 LAB — SURGICAL PATHOLOGY

## 2020-11-15 MED ORDER — HYDROCODONE-ACETAMINOPHEN 7.5-325 MG PO TABS: 1.0000 | ORAL_TABLET | Freq: Two times a day (BID) | ORAL | 0 refills | Status: DC | PRN

## 2020-11-15 NOTE — Telephone Encounter (Signed)
Returned call to patient. Requesting Rx for hydrocodone be sent to [facility]. This has been addressed in a separate encounter. Also let him know of his appt with PCP on 6/20 at 1045. He is aware to schedule with other Providers as per Discharge Instructions.

## 2020-11-15 NOTE — Telephone Encounter (Signed)
Pt is requesting his HYDROcodone-acetaminophen ([place]) 7.5-325 MG tablet sent to  [id number] - [place], [place] - [place] Phone:  [phone]  Fax:  [phone]

## 2020-11-15 NOTE — Telephone Encounter (Signed)
Pt is requesting a call back. 

## 2020-11-16 ENCOUNTER — Other Ambulatory Visit: Payer: Self-pay | Admitting: Student in an Organized Health Care Education/Training Program

## 2020-11-16 DIAGNOSIS — A4159 Other Gram-negative sepsis: Secondary | ICD-10-CM | POA: Diagnosis not present

## 2020-11-16 DIAGNOSIS — C259 Malignant neoplasm of pancreas, unspecified: Secondary | ICD-10-CM | POA: Diagnosis not present

## 2020-11-16 DIAGNOSIS — I2510 Atherosclerotic heart disease of native coronary artery without angina pectoris: Secondary | ICD-10-CM | POA: Diagnosis not present

## 2020-11-16 DIAGNOSIS — I130 Hypertensive heart and chronic kidney disease with heart failure and stage 1 through stage 4 chronic kidney disease, or unspecified chronic kidney disease: Secondary | ICD-10-CM | POA: Diagnosis not present

## 2020-11-16 DIAGNOSIS — M109 Gout, unspecified: Secondary | ICD-10-CM | POA: Diagnosis not present

## 2020-11-16 DIAGNOSIS — I509 Heart failure, unspecified: Secondary | ICD-10-CM | POA: Diagnosis not present

## 2020-11-16 DIAGNOSIS — M1991 Primary osteoarthritis, unspecified site: Secondary | ICD-10-CM | POA: Diagnosis not present

## 2020-11-16 DIAGNOSIS — N182 Chronic kidney disease, stage 2 (mild): Secondary | ICD-10-CM | POA: Diagnosis not present

## 2020-11-16 DIAGNOSIS — D631 Anemia in chronic kidney disease: Secondary | ICD-10-CM | POA: Diagnosis not present

## 2020-11-16 MED ORDER — ALLOPURINOL 100 MG PO TABS: 100.0000 mg | ORAL_TABLET | Freq: Every day | ORAL | 3 refills | Status: DC

## 2020-11-16 NOTE — Telephone Encounter (Signed)
Need refill on Gout medicine ;pt contact [phone]   [id number] - [place], [place]

## 2020-11-16 NOTE — Progress Notes (Signed)
Cardiology Office Note:    Date:  11/20/2020   ID:  [name], DOB 1945-08-25, MRN [id number]  PCP:  Axel Filler, MD   [facility] Providers Cardiologist:  None {   Referring MD: Axel Filler    History of Present Illness:    [name] is a 75 y.o. 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  Per review of the record, patient has history of CAD with remote NSTEMI in 2004 s/p DES to proximal RCA and staged PCI to mid LCx. There was moderate residual disease in the distal LCx. LM, LAD were OK, LVEF 60% at that time. Saw Dr. Harrington Challenger in the office once in 2016 for follow-up of CAD. He had NSIVCD on EKG at that time with occasional PACs. Subsequent tracings during admissions for other issues showed some degree of conduction abnormality with episodic bradycardia (see 2017, 2020). 2020 tracing showed RBBB/LAFB pattern.  He was admitted 11/06/2020 with progressive weakness, poor appetite, weight loss, and constipation. 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 hsTroponin low/flat [phone] and TTE 11/06/20 reassuring with  normal LVEF. He was on heparin for PVT - anticoagulation recommended by vascular surgery, which was later stopped as he was stopped as he was felt to be poor candidate for anticoagulation. Cardiology was consulted due to bradycardia with HR 30-40s on the monitor. He was on BB at this time. His nodal agent was stopped and his HR improved. Patient remained asymptomatic.   Today, the patient feels much better since coming home from the hospital. Appetite is improved and no constipation. Has indwelling urinary catheter with follow-up with Urology on 12/05/20. HR better since being off the beta blocker. No lightheadedness, dizziness, or syncope. No chest pain, SOB, palpitations or LE edema. Patient is not currently on ASA or AC. Denies any blood in stool or urine.  Past Medical History:  Diagnosis Date  . AAA (abdominal aortic aneurysm) ([place])    followed by vasc surgery  . Alcohol abuse   . Barretts esophagus    bx distal esophagus 2011 neg   . Bladder outlet obstruction 04/02/2018  . CAD in native artery    a. s/p stent to prox RCA and mid Cx.  . Cataract   . Cholelithiasis   . Chronic osteomyelitis involving ankle and foot, right ([place]) 07/13/2018  . 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   2006 - 3 adenomas largest 12 mm  2008 - no polyps 2013-  2 mm cecal polyp, adenoma, routine repeat colonoscopy 02/2017 approx 05/14/2017 18 polyps max 12 mm ALL adenomas recall 2019 -  [name] Mayer, MD, [facility]     . Diverticulosis of colon   . DJD (degenerative joint disease)    left hip  . Full dentures    upper and lower  . GERD (gastroesophageal reflux disease)  diet controlled   . Gout    feet  . Hiatal hernia    2 cm noted on upper endos 2011   . Hx of adenomatous colonic polyps   . Hyperlipidemia   . Hypertension   . Internal hemorrhoid   . 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 Myocardial infarction [facility]) 2004   hx of, 2 stents 2004. pt was  on Plavix x 3 years then transitioned to ASA, no problems since  . Non-small cell carcinoma of right lung, stage 1 ([place]) 01/30/2016  . Renal failure, acute ([facility])    hx of 2/2 medication, resolved  . Tobacco abuse    smoker .5 ppd x 55 yrs  . UTI (lower urinary tract infection)     Past Surgical History:  Procedure Laterality Date  . BIOPSY  11/12/2020   Procedure: BIOPSY;  Surgeon: Doran Stabler, MD;  Location: [facility] ENDOSCOPY;  Service: Gastroenterology;;  . cataract surgery Bilateral 2018   removal of cataracts  . COLONOSCOPY     hx polyps - [name] 02/2012  . COLONOSCOPY WITH PROPOFOL N/A 11/12/2020   Procedure: COLONOSCOPY WITH PROPOFOL;  Surgeon: Doran Stabler, MD;  Location: [place];  Service: Gastroenterology;  Laterality: N/A;  . CORONARY ANGIOPLASTY WITH STENT PLACEMENT    . ESOPHAGOGASTRODUODENOSCOPY (EGD) WITH PROPOFOL N/A 11/12/2020   Procedure: ESOPHAGOGASTRODUODENOSCOPY (EGD) WITH PROPOFOL;  Surgeon: Doran Stabler, MD;  Location: [place];  Service: Gastroenterology;  Laterality: N/A;  . flexible cystourethroscopy  05/24/06  . INGUINAL HERNIA REPAIR  2005  . LUNG LOBECTOMY Right 11/07   RUL  . MULTIPLE TOOTH EXTRACTIONS    . POLYPECTOMY  11/12/2020   Procedure: POLYPECTOMY;  Surgeon: Doran Stabler, MD;  Location: [facility] ENDOSCOPY;  Service: Gastroenterology;;  . UPPER GASTROINTESTINAL ENDOSCOPY      Current Medications: Current Meds  Medication Sig  . albuterol (VENTOLIN HFA) 108 (90 Base) MCG/ACT inhaler Inhale 1-2 puffs into the lungs every 6 (six) hours as needed for wheezing or shortness of breath.  . allopurinol (ZYLOPRIM) 100 MG tablet Take 1 tablet (100 mg total) by mouth at bedtime.  Marland Kitchen aspirin EC 81 MG tablet Take 1 tablet (81 mg total) by mouth daily. Swallow whole.  Marland Kitchen HYDROcodone-acetaminophen (NORCO) 7.5-325 MG tablet Take 1 tablet by mouth 2 (two) times daily as needed for moderate pain.  Marland Kitchen lipase/protease/amylase 20000-63000 units CPEP  Take 60,000 Units by mouth 3 (three) times daily before meals.  . Multiple Vitamin (MULTIVITAMIN WITH MINERALS) TABS tablet Take 1 tablet by mouth daily.  . rosuvastatin (CRESTOR) 20 MG tablet TAKE 1 TABLET EVERY DAY  . sulfamethoxazole-trimethoprim (BACTRIM DS) 800-160 MG tablet Take 1 tablet by mouth every 12 (twelve) hours.  . tamsulosin (FLOMAX) 0.4 MG CAPS capsule TAKE 2 CAPSULES (0.8 MG TOTAL) BY MOUTH EVERY EVENING.     Allergies:   Candesartan cilexetil-hctz, Hydrochlorothiazide, and Shellfish allergy   Social History   Socioeconomic History  . Marital status: Single    Spouse name: Not on file  . Number of children: 0  . Years of education: Not on file  . Highest education level: Not on file  Occupational History  . Not on file  Tobacco Use  . Smoking status: Current Every Day Smoker    Packs/day: 0.25    Years: 55.00    Pack years: 13.75    Types: Cigarettes  . Smokeless tobacco: Never Used  Vaping Use  . Vaping Use: Never used  Substance and Sexual Activity  . Alcohol use: Yes    Alcohol/week: 0.0 standard drinks    Comment: drinking 1-2 pts gin/week, (mixed with tap water not tonic)  . Drug use: No  . Sexual activity: Not on file  Other Topics Concern  . Not on file  Social History Narrative  . Not on file   Social Determinants of Health   Financial Resource Strain: Not on file  Food Insecurity: Not on file  Transportation Needs: Not on file  Physical Activity: Not on file  Stress: Not on file  Social Connections: Not on file     Family History: The patient's family history includes Arthritis in his brother; Diabetes in his mother and sister; Gout in his brother; Heart disease in his sister; Hypertension in his sister. There is no history of Colon cancer, Esophageal cancer, Rectal cancer, Stomach cancer, or Colon polyps.  ROS:   Please see the history of present illness.    Review of Systems  Constitutional: Negative for chills and fever.  HENT:  Negative for hearing loss.   Eyes: Negative for blurred vision.  Respiratory: Negative for shortness of breath.   Cardiovascular: Negative for chest pain, palpitations, orthopnea, claudication, leg swelling and PND.  Gastrointestinal: Negative for blood in stool, constipation and melena.  Genitourinary: Negative for hematuria.  Musculoskeletal: Positive for joint pain. Negative for falls.  Neurological: Negative for dizziness and loss of consciousness.    EKGs/Labs/Other Studies Reviewed:    The following studies were reviewed today: Cath 04/2003 #1 RESULTS: 1. Left main coronary artery: The left main coronary was free of significant disease. 2. Left anterior descending artery: The left anterior descending gave rise to three septal perforators and three diagonal branches. This vessel was irregular, but there was no major obstruction. 3. Left circumflex artery: The left circumflex gave rise to a small intermediate branch and two large posterolateral branches. There was 80% narrowing in the mid-circumflex artery. There was 50% narrowing in the distal circumflex artery before the posterolateral branches. 4. Right coronary artery: The right coronary artery had an anterior and inferior-directed takeoff and had several sharp bends in its proximal portion with diffuse disease with focal narrowing of 90%. There were a conus and atrial branch that arose from the lesions. The right coronary artery then gave rise to a right ventricular branch, a posterior descending branch, and two small posterolateral branches. The distal vessel was irregular but free of major obstruction. 5. Left ventriculogram: The left ventriculogram performed in RAO projection showed mild inferior wall hypokinesis. The estimated ejection fraction was 60%. The aortic pressure was 148/17, left ventricular pressure was 148/80. 6. Following  stenting of the lesion in the proximal right coronary artery, the stenosis improved from 90% to less than 10%.  CONCLUSION: 1. Coronary artery disease, status post recent non-ST segment elevation myocardial infarction with irregularities in the left anterior descending artery, 80% mid- and 50% distal stenosis in the circumflex artery, 90% stenosis in the proximal right coronary artery, with mild inferior wall hypokinesis and an ejection fraction estimated at 60%. 2. Successful stenting of the proximal right coronary artery with a bare metal stent with improvement in sentinel narrowing from 90% to less than 10%.  DISPOSITION: The patient was returned to the post-angioplasty unit for further observation. Will plan to intervene on the circumflex artery in two days on Wednesday.    Eustace Quail, M.D.  Cath 04/2003 #2 CARDIAC CATHETERIZATION  INDICATIONS  FOR PROCEDURE: [name] is 75 years old and was admitted to the hospital with chest pain and enzymes consistent with a non-ST elevation myocardial infarction. He underwent a difficult intervention two days ago of a tight lesion in the right coronary artery with a bare metal stent and was brought back today for intervention on the circumflex artery.  PROCEDURE: The procedure was performed via the left femoral artery using arterial sheath and a CLS-4 6 [place] guiding catheter with side holes. We used a PT2 wire light support. We crossed the lesion with the wire without difficulty. We predilated with a 3.0 x 20-mm Maverick performing one inflation up to 6 atmospheres for 30 seconds. We then passed a 3.0 x 24-mm Taxus stent down to the lesion. We had some difficulty advancing the stent down to the lesion because of marked tortuosity in the proximal vessel. However, we were able to accomplish this with less difficulty  than anticipated. We deployed the stent with one inflation of 12 atmospheres for 30 seconds. We then post dilated with a 3.25 x 20-mm Quantum Maverick performing one inflation up to 15 atmospheres for 30 seconds. Repeat diagnostic study was then performed through the guiding catheter. The patient tolerated the procedure well and left the laboratory in satisfactory condition.  RESULTS: Initially, the stenosis in the mid circumflex artery was estimated at 80%. Following stenting, this improved to less than 10%.  CONCLUSIONS: Successful stenting of the lesion in the mid circumflex artery with a Taxus stent with improvement in stent renarrowing from 80% to less than 10%.  DISPOSITION: The patient returned to the postangioplasty unit for further observation.  He will need Plavix for six months and aspirin indefinitely.  TTE 11/06/20 1. Left ventricular ejection fraction, by estimation, is 55 to 60%. The  left ventricle has normal function. Left ventricular endocardial border  not optimally defined to evaluate regional wall motion. There is mild left  ventricular hypertrophy. Left  ventricular diastolic parameters are consistent with Grade I diastolic  dysfunction (impaired relaxation).  2. Right ventricular systolic function was not well visualized. The right  ventricular size is not well visualized. There is normal pulmonary artery  systolic pressure. The estimated right ventricular systolic pressure is  24.0 mmHg.  3. The mitral valve is normal in structure. No evidence of mitral valve  regurgitation. No evidence of mitral stenosis.  4. The aortic valve is tricuspid. Aortic valve regurgitation is not  visualized. No aortic stenosis is present.  5. The inferior vena cava is normal in size with greater than 50%  respiratory variability, suggesting right atrial pressure of 3 mmHg.  6. Technically difficult study with poor acoustic windows.    08/30/20: Summary:  Abdominal Aorta: There is evidence of abnormal dilatation of the mid  Abdominal aorta. The largest aortic diameter remains essentially unchanged  compared to prior exam. Previous diameter measurement was 5.2 cm obtained  on 02/14/2020.  Stenosis:  Mural thrombus seen in mid AAA sac. Common iliac arteries are ectatic  bilaterally.   08/30/20: Summary:  Right: Resting right ankle-brachial index is within normal range. No  evidence of significant right lower extremity arterial disease. The right  toe-brachial index is abnormal. RT great toe pressure = 68 mmHg.   Left: Resting left ankle-brachial index is within normal range. No  evidence of significant left lower extremity arterial disease. The left  toe-brachial index is abnormal. LT Great toe pressure = 83 mmHg.   EKG:  EKG today shows frequent PACs (less likely  flutter) with HR 103  Recent Labs: 11/07/2020: TSH 0.986 11/13/2020: ALT 41; BUN 8; Creatinine, Ser 1.28; Hemoglobin 9.1; Magnesium 2.0; Platelets 135; Potassium 4.1; Sodium 138  Recent Lipid Panel    Component Value Date/Time   CHOL 74 11/11/2020 1555   CHOL 112 07/13/2018 1002   TRIG 54 11/11/2020 1555   HDL 20 (L) 11/11/2020 1555   HDL 51 07/13/2018 1002   CHOLHDL 3.7 11/11/2020 1555   VLDL 11 11/11/2020 1555   LDLCALC 43 11/11/2020 1555   LDLCALC 51 07/13/2018 1002     Physical Exam:    VS:  BP 118/68   Pulse 70   Ht 6\' 1"  (1.854 m)   Wt 137 lb 9.6 oz (62.4 kg)   SpO2 96%   BMI 18.15 kg/m     Wt Readings from Last 3 Encounters:  11/20/20 137 lb 9.6 oz (62.4 kg)  11/12/20 135 lb (61.2 kg)  08/30/20 147 lb 8 oz (66.9 kg)     GEN:  Thin, frail appearing HEENT: Normal NECK: No JVD; No carotid bruits CARDIAC: Irregularly irregular, no murmurs, rubs, gallops RESPIRATORY:  Clear to auscultation without rales, wheezing or rhonchi  ABDOMEN: Soft, non-tender, non-distended MUSCULOSKELETAL:  No edema; No deformity  SKIN: Warm and  dry NEUROLOGIC:  Alert and oriented x 3 PSYCHIATRIC:  Normal affect   ASSESSMENT:    1. Sinus bradycardia   2. Palpitations   3. PAC (premature atrial contraction)   4. PAD (peripheral artery disease) ([place])   5. AAA (abdominal aortic aneurysm) without rupture ([place])   6. Mobitz (type) I (Wenckebach's) atrioventricular block   7. Coronary artery disease involving native coronary artery of native heart without angina pectoris   8. Portal vein thrombosis    PLAN:    In order of problems listed above:  #Asymptomatic Sinus Bradycardia: #Known LAFB, RBBB: #Episodes of Mobitz type I AVB Cardiology was consulted during recent admission for asymptomatic bradycardia. Has similar tracings back to 2017 with known RBBB/LAFB. HR improved with discontinuation of beta blocker. Currently 70bpm. -Avoid nodal agents -HR improved  #Frequent PACs: Patient with irregular rhythm on exam today. ECG with frequent PACs (vs less likely Aflutter). Not a good candidate for BB due to bradycardia. Will check monitor to ensure no Afib. Fortunately LVEF is normal on TTE 11/06/20. -Check 3 day zio -No nodal agents due to bradycardia as above  #CAD s/p prior MI/PCI: Patient with history of NSTEMI in 2004 s/p DES to proximal RCA and staged PCI to mid LCx. There was moderate residual disease in the distal LCx. LM, LAD were OK. No recent anginal symptoms. TTE 11/06/20 normal EF 55-60%, grade 1 DD, normal PASP.  - Resume ASA 81mg  daily - Avoid BB due to bradycardia - Would defer to GI whether he is felt safe to continue rosuvastatin in the context of the concern for liver disease mentioned  #PAD with aortic aneurysm (5.3x5.2cm) and aortic ulcer CT showed a small penetrating ulcer of the anterior aspect of the distal descending thoracic aorta at the level of the aortic hiatus, fusiform aneurysmal dilatation of the infrarenal abdominal aorta which measures up to 5.3 x 5.2 cm indiameter, and aneurysmal dilatation of the  right common iliac arterywhich measures 2.0 cm in diameter. Recommend follow-up CT/MR every 73months and vascular consultation" - Follow-up with vascular surgery as above -Resume ASA 81mg  daily as above  #Non-obstructive portal vein thrombosis:  Not on AC due to concern for high risk of bleed. -Follow-up with vascular surgery  as scheduled -Resume ASA as above -Will need GI clearance for statin due to concern for cirrhosis  #Recent Enterobacter bacteremia #Deconditioning #Pancreatic Ectasia or IPMN: -Follow-up scheduled per patient with PCP     Medication Adjustments/Labs and Tests Ordered: Current medicines are reviewed at length with the patient today.  Concerns regarding medicines are outlined above.  Orders Placed This Encounter  Procedures  . LONG TERM MONITOR (3-14 DAYS)  . EKG 12-Lead   Meds ordered this encounter  Medications  . aspirin EC 81 MG tablet    Sig: Take 1 tablet (81 mg total) by mouth daily. Swallow whole.    Dispense:  90 tablet    Refill:  3    Patient Instructions  Medication Instructions:   START TAKING ASPIRIN 81 MG BY MOUTH DAILY  *If you need a refill on your cardiac medications before your next appointment, please call your pharmacy*   Testing/Procedures:  ZIO XT- Long Term Monitor Instructions  Your physician has requested you wear a ZIO patch monitor for 3 days.  This is a single patch monitor. [employer] supplies one patch monitor per enrollment. Additional stickers are not available. Please do not apply patch if you will be having a Nuclear Stress Test,  Echocardiogram, Cardiac CT, MRI, or Chest Xray during the period you would be wearing the  monitor. The patch cannot be worn during these tests. You cannot remove and re-apply the  ZIO XT patch monitor.  Your ZIO patch monitor will be mailed 3 day USPS to your address on file. It may take 3-5 days  to receive your monitor after you have been enrolled.  Once you have received your monitor,  please review the enclosed instructions. Your monitor  has already been registered assigning a specific monitor serial # to you.  Billing and Patient Assistance Program Information  We have supplied [employer] with any of your insurance information on file for billing purposes. [employer] offers a sliding scale Patient Assistance Program for patients that do not have  insurance, or whose insurance does not completely cover the cost of the ZIO monitor.  You must apply for the Patient Assistance Program to qualify for this discounted rate.  To apply, please call [employer] at [phone], select option 4, select option 2, ask to apply for  Patient Assistance Program. Theodore Demark will ask your household income, and how many people  are in your household. They will quote your out-of-pocket cost based on that information.  [employer] will also be able to set up a 76-month, interest-free payment plan if needed.  Applying the monitor   Shave hair from upper left chest.  Hold abrader disc by orange tab. Rub abrader in 40 strokes over the upper left chest as  indicated in your monitor instructions.  Clean area with 4 enclosed alcohol pads. Let dry.  Apply patch as indicated in monitor instructions. Patch will be placed under collarbone on left  side of chest with arrow pointing upward.  Rub patch adhesive wings for 2 minutes. Remove white label marked "1". Remove the white  label marked "2". Rub patch adhesive wings for 2 additional minutes.  While looking in a mirror, press and release button in center of patch. A small green light will  flash 3-4 times. This will be your only indicator that the monitor has been turned on.  Do not shower for the first 24 hours. You may shower after the first 24 hours.  Press the button if you feel a symptom. You will  hear a small click. Record Date, Time and  Symptom in the Patient Logbook.  When you are ready to remove the patch, follow instructions on the last 2 pages of  Patient  Logbook. Stick patch monitor onto the last page of Patient Logbook.  Place Patient Logbook in the blue and white box. Use locking tab on box and tape box closed  securely. The blue and white box has prepaid postage on it. Please place it in the mailbox as  soon as possible. Your physician should have your test results approximately 7 days after the  monitor has been mailed back to [facility].  Call [facility] at [phone] if you have questions regarding  your ZIO XT patch monitor. Call them immediately if you see an orange light blinking on your  monitor.  If your monitor falls off in less than 4 days, contact our Monitor department at [phone].  If your monitor becomes loose or falls off after 4 days call [employer] at [phone] for  suggestions on securing your monitor    Follow-Up:  ON January 18, 2021 WITH DR. Johney Frame IN THE OFFICE--PLEASE SCHEDULE AT ANY OPEN SLOT OR HELD SLOT       Signed, Freada Bergeron, MD  11/20/2020 1:27 PM    [facility]

## 2020-11-17 ENCOUNTER — Telehealth: Payer: Self-pay

## 2020-11-17 DIAGNOSIS — I130 Hypertensive heart and chronic kidney disease with heart failure and stage 1 through stage 4 chronic kidney disease, or unspecified chronic kidney disease: Secondary | ICD-10-CM | POA: Diagnosis not present

## 2020-11-17 DIAGNOSIS — D631 Anemia in chronic kidney disease: Secondary | ICD-10-CM | POA: Diagnosis not present

## 2020-11-17 DIAGNOSIS — M109 Gout, unspecified: Secondary | ICD-10-CM | POA: Diagnosis not present

## 2020-11-17 DIAGNOSIS — A4159 Other Gram-negative sepsis: Secondary | ICD-10-CM | POA: Diagnosis not present

## 2020-11-17 DIAGNOSIS — I509 Heart failure, unspecified: Secondary | ICD-10-CM | POA: Diagnosis not present

## 2020-11-17 DIAGNOSIS — C259 Malignant neoplasm of pancreas, unspecified: Secondary | ICD-10-CM | POA: Diagnosis not present

## 2020-11-17 DIAGNOSIS — I2510 Atherosclerotic heart disease of native coronary artery without angina pectoris: Secondary | ICD-10-CM | POA: Diagnosis not present

## 2020-11-17 DIAGNOSIS — M1991 Primary osteoarthritis, unspecified site: Secondary | ICD-10-CM | POA: Diagnosis not present

## 2020-11-17 DIAGNOSIS — N182 Chronic kidney disease, stage 2 (mild): Secondary | ICD-10-CM | POA: Diagnosis not present

## 2020-11-17 NOTE — Telephone Encounter (Signed)
Physical therapist Tillie Rung called to schedule an ASAP appointment for patient because the emergency room discharge paperwork said he required one. Talked to patient, he denies any pain in his legs, ulcers, back pain or belly pain. He was seen in our office in March 2022 with AAA duplex. Advised that he did not need an ASAP appt and could follow up in 6 months as previously planned. Tillie Rung is "a little confused as to how we can just decide not to schedule an appointment when the paperwork indicates he needs one." Advised that per his lack of symptoms - he could see [place] as recommended per our MD and call if he developed any issues sooner. Also confirmed this plan from a [facility] consult note. He should confirm whether appointments with cardiology and others indicated on the ED discharge sheet are needed with their respective offices. I confirmed with her I would document that she called and we did not schedule a sooner [facility] appointment for him.

## 2020-11-20 ENCOUNTER — Encounter: Payer: Self-pay | Admitting: Cardiology

## 2020-11-20 ENCOUNTER — Ambulatory Visit (INDEPENDENT_AMBULATORY_CARE_PROVIDER_SITE_OTHER): Payer: Medicare HMO

## 2020-11-20 ENCOUNTER — Telehealth: Payer: Self-pay | Admitting: *Deleted

## 2020-11-20 ENCOUNTER — Ambulatory Visit (INDEPENDENT_AMBULATORY_CARE_PROVIDER_SITE_OTHER): Payer: Medicare HMO | Admitting: Cardiology

## 2020-11-20 ENCOUNTER — Other Ambulatory Visit: Payer: Self-pay

## 2020-11-20 VITALS — BP 118/68 | HR 70 | Ht 73.0 in | Wt 137.6 lb

## 2020-11-20 DIAGNOSIS — I714 Abdominal aortic aneurysm, without rupture, unspecified: Secondary | ICD-10-CM

## 2020-11-20 DIAGNOSIS — R001 Bradycardia, unspecified: Secondary | ICD-10-CM

## 2020-11-20 DIAGNOSIS — I491 Atrial premature depolarization: Secondary | ICD-10-CM | POA: Diagnosis not present

## 2020-11-20 DIAGNOSIS — R002 Palpitations: Secondary | ICD-10-CM | POA: Diagnosis not present

## 2020-11-20 DIAGNOSIS — I81 Portal vein thrombosis: Secondary | ICD-10-CM | POA: Diagnosis not present

## 2020-11-20 DIAGNOSIS — I2510 Atherosclerotic heart disease of native coronary artery without angina pectoris: Secondary | ICD-10-CM | POA: Diagnosis not present

## 2020-11-20 DIAGNOSIS — I441 Atrioventricular block, second degree: Secondary | ICD-10-CM

## 2020-11-20 DIAGNOSIS — I739 Peripheral vascular disease, unspecified: Secondary | ICD-10-CM

## 2020-11-20 DIAGNOSIS — I493 Ventricular premature depolarization: Secondary | ICD-10-CM

## 2020-11-20 MED ORDER — ASPIRIN EC 81 MG PO TBEC: 81.0000 mg | DELAYED_RELEASE_TABLET | Freq: Every day | ORAL | 3 refills | Status: DC

## 2020-11-20 NOTE — Telephone Encounter (Signed)
-----   Message from Jennefer Bravo sent at 11/20/2020 12:54 PM EDT ----- Regarding: RE: 3 DAY ZIO PER [place] Done ----- Message ----- From: Nuala Alpha, LPN Sent: 4/0/4591  36:85 PM EDT To: Jennefer Bravo, Katrina Theda [place] Subject: 3 DAY ZIO PER [place]                        Dr. Johney Frame wants this pt to have a 3 day zio for palpitations and frequent PVCs. Order is in and pt is aware you will enroll. Please enroll and let me know when you do?  Thanks, [employer]

## 2020-11-20 NOTE — Patient Instructions (Addendum)
Medication Instructions:   START TAKING ASPIRIN 81 MG BY MOUTH DAILY  *If you need a refill on your cardiac medications before your next appointment, please call your pharmacy*   Testing/Procedures:  ZIO XT- Long Term Monitor Instructions  Your physician has requested you wear a ZIO patch monitor for 3 days.  This is a single patch monitor. [employer] supplies one patch monitor per enrollment. Additional stickers are not available. Please do not apply patch if you will be having a Nuclear Stress Test,  Echocardiogram, Cardiac CT, MRI, or Chest Xray during the period you would be wearing the  monitor. The patch cannot be worn during these tests. You cannot remove and re-apply the  ZIO XT patch monitor.  Your ZIO patch monitor will be mailed 3 day USPS to your address on file. It may take 3-5 days  to receive your monitor after you have been enrolled.  Once you have received your monitor, please review the enclosed instructions. Your monitor  has already been registered assigning a specific monitor serial # to you.  Billing and Patient Assistance Program Information  We have supplied [employer] with any of your insurance information on file for billing purposes. [employer] offers a sliding scale Patient Assistance Program for patients that do not have  insurance, or whose insurance does not completely cover the cost of the ZIO monitor.  You must apply for the Patient Assistance Program to qualify for this discounted rate.  To apply, please call [employer] at [phone], select option 4, select option 2, ask to apply for  Patient Assistance Program. Theodore Demark will ask your household income, and how many people  are in your household. They will quote your out-of-pocket cost based on that information.  [employer] will also be able to set up a 42-month, interest-free payment plan if needed.  Applying the monitor   Shave hair from upper left chest.  Hold abrader disc by orange tab. Rub abrader in 40  strokes over the upper left chest as  indicated in your monitor instructions.  Clean area with 4 enclosed alcohol pads. Let dry.  Apply patch as indicated in monitor instructions. Patch will be placed under collarbone on left  side of chest with arrow pointing upward.  Rub patch adhesive wings for 2 minutes. Remove white label marked "1". Remove the white  label marked "2". Rub patch adhesive wings for 2 additional minutes.  While looking in a mirror, press and release button in center of patch. A small green light will  flash 3-4 times. This will be your only indicator that the monitor has been turned on.  Do not shower for the first 24 hours. You may shower after the first 24 hours.  Press the button if you feel a symptom. You will hear a small click. Record Date, Time and  Symptom in the Patient Logbook.  When you are ready to remove the patch, follow instructions on the last 2 pages of Patient  Logbook. Stick patch monitor onto the last page of Patient Logbook.  Place Patient Logbook in the blue and white box. Use locking tab on box and tape box closed  securely. The blue and white box has prepaid postage on it. Please place it in the mailbox as  soon as possible. Your physician should have your test results approximately 7 days after the  monitor has been mailed back to [facility].  Call [facility] at [phone] if you have questions regarding  your ZIO XT patch monitor.  Call them immediately if you see an orange light blinking on your  monitor.  If your monitor falls off in less than 4 days, contact our Monitor department at [phone].  If your monitor becomes loose or falls off after 4 days call [employer] at [phone] for  suggestions on securing your monitor    Follow-Up:  ON January 18, 2021 WITH DR. Johney Frame IN THE OFFICE--PLEASE SCHEDULE AT ANY OPEN SLOT OR HELD SLOT

## 2020-11-21 DIAGNOSIS — I509 Heart failure, unspecified: Secondary | ICD-10-CM | POA: Diagnosis not present

## 2020-11-21 DIAGNOSIS — M109 Gout, unspecified: Secondary | ICD-10-CM | POA: Diagnosis not present

## 2020-11-21 DIAGNOSIS — D631 Anemia in chronic kidney disease: Secondary | ICD-10-CM | POA: Diagnosis not present

## 2020-11-21 DIAGNOSIS — M1991 Primary osteoarthritis, unspecified site: Secondary | ICD-10-CM | POA: Diagnosis not present

## 2020-11-21 DIAGNOSIS — C259 Malignant neoplasm of pancreas, unspecified: Secondary | ICD-10-CM | POA: Diagnosis not present

## 2020-11-21 DIAGNOSIS — A4159 Other Gram-negative sepsis: Secondary | ICD-10-CM | POA: Diagnosis not present

## 2020-11-21 DIAGNOSIS — I130 Hypertensive heart and chronic kidney disease with heart failure and stage 1 through stage 4 chronic kidney disease, or unspecified chronic kidney disease: Secondary | ICD-10-CM | POA: Diagnosis not present

## 2020-11-21 DIAGNOSIS — I2510 Atherosclerotic heart disease of native coronary artery without angina pectoris: Secondary | ICD-10-CM | POA: Diagnosis not present

## 2020-11-21 DIAGNOSIS — N182 Chronic kidney disease, stage 2 (mild): Secondary | ICD-10-CM | POA: Diagnosis not present

## 2020-11-22 DIAGNOSIS — I493 Ventricular premature depolarization: Secondary | ICD-10-CM

## 2020-11-22 DIAGNOSIS — D631 Anemia in chronic kidney disease: Secondary | ICD-10-CM | POA: Diagnosis not present

## 2020-11-22 DIAGNOSIS — M109 Gout, unspecified: Secondary | ICD-10-CM | POA: Diagnosis not present

## 2020-11-22 DIAGNOSIS — I2510 Atherosclerotic heart disease of native coronary artery without angina pectoris: Secondary | ICD-10-CM | POA: Diagnosis not present

## 2020-11-22 DIAGNOSIS — C259 Malignant neoplasm of pancreas, unspecified: Secondary | ICD-10-CM | POA: Diagnosis not present

## 2020-11-22 DIAGNOSIS — R002 Palpitations: Secondary | ICD-10-CM

## 2020-11-22 DIAGNOSIS — M1991 Primary osteoarthritis, unspecified site: Secondary | ICD-10-CM | POA: Diagnosis not present

## 2020-11-22 DIAGNOSIS — I130 Hypertensive heart and chronic kidney disease with heart failure and stage 1 through stage 4 chronic kidney disease, or unspecified chronic kidney disease: Secondary | ICD-10-CM | POA: Diagnosis not present

## 2020-11-22 DIAGNOSIS — N182 Chronic kidney disease, stage 2 (mild): Secondary | ICD-10-CM | POA: Diagnosis not present

## 2020-11-22 DIAGNOSIS — I509 Heart failure, unspecified: Secondary | ICD-10-CM | POA: Diagnosis not present

## 2020-11-22 DIAGNOSIS — A4159 Other Gram-negative sepsis: Secondary | ICD-10-CM | POA: Diagnosis not present

## 2020-11-23 DIAGNOSIS — I2510 Atherosclerotic heart disease of native coronary artery without angina pectoris: Secondary | ICD-10-CM | POA: Diagnosis not present

## 2020-11-23 DIAGNOSIS — M109 Gout, unspecified: Secondary | ICD-10-CM | POA: Diagnosis not present

## 2020-11-23 DIAGNOSIS — N182 Chronic kidney disease, stage 2 (mild): Secondary | ICD-10-CM | POA: Diagnosis not present

## 2020-11-23 DIAGNOSIS — I130 Hypertensive heart and chronic kidney disease with heart failure and stage 1 through stage 4 chronic kidney disease, or unspecified chronic kidney disease: Secondary | ICD-10-CM | POA: Diagnosis not present

## 2020-11-23 DIAGNOSIS — I509 Heart failure, unspecified: Secondary | ICD-10-CM | POA: Diagnosis not present

## 2020-11-23 DIAGNOSIS — A4159 Other Gram-negative sepsis: Secondary | ICD-10-CM | POA: Diagnosis not present

## 2020-11-23 DIAGNOSIS — C259 Malignant neoplasm of pancreas, unspecified: Secondary | ICD-10-CM | POA: Diagnosis not present

## 2020-11-23 DIAGNOSIS — M1991 Primary osteoarthritis, unspecified site: Secondary | ICD-10-CM | POA: Diagnosis not present

## 2020-11-23 DIAGNOSIS — D631 Anemia in chronic kidney disease: Secondary | ICD-10-CM | POA: Diagnosis not present

## 2020-11-27 ENCOUNTER — Ambulatory Visit (INDEPENDENT_AMBULATORY_CARE_PROVIDER_SITE_OTHER): Payer: Medicare HMO | Admitting: Student in an Organized Health Care Education/Training Program

## 2020-11-27 ENCOUNTER — Encounter: Payer: Self-pay | Admitting: Student in an Organized Health Care Education/Training Program

## 2020-11-27 VITALS — BP 125/66 | HR 60 | Temp 98.2°F | Ht 73.0 in | Wt 141.5 lb

## 2020-11-27 DIAGNOSIS — K8689 Other specified diseases of pancreas: Secondary | ICD-10-CM | POA: Diagnosis not present

## 2020-11-27 DIAGNOSIS — E43 Unspecified severe protein-calorie malnutrition: Secondary | ICD-10-CM

## 2020-11-27 DIAGNOSIS — R339 Retention of urine, unspecified: Secondary | ICD-10-CM

## 2020-11-27 DIAGNOSIS — I441 Atrioventricular block, second degree: Secondary | ICD-10-CM | POA: Diagnosis not present

## 2020-11-27 MED ORDER — ROSUVASTATIN CALCIUM 20 MG PO TABS: 20.0000 mg | ORAL_TABLET | Freq: Every day | ORAL | 1 refills | Status: DC

## 2020-11-27 MED ORDER — TAMSULOSIN HCL 0.4 MG PO CAPS: 0.8000 mg | ORAL_CAPSULE | Freq: Every evening | ORAL | 3 refills | Status: DC

## 2020-11-27 NOTE — Progress Notes (Signed)
   Assessment and Plan:  See Encounters tab for problem-based medical decision making.   __________________________________________________________  HPI:   75 year old man here for follow up after a complicated recent hospitalization. He was admitted with an enterobacter bacteremia which was well treated with antibiotics and has now resolved. The source of that infection is unclear, may have been gut translocation, and I wonder if chronic urinary retention may have played a role. He had a foley catheter placed, which is still in place, and he has been managing it well at home. No bleeding or symptoms of UTI. He had a CT which incidentally showed a possible pancreatic head mass, but MRI was not diagnostic. He had colo and endoscopy which was fairly reassuring. He was started on pancreatic enzymes which he has accessed and been using well. He says he is eating "like a pony", he is gaining weight, great access to food. Good adherence with his medications. He has home PT arranged, which has helped with deconditioning. He has had nice support from his family. He has upcoming appointments with numerous consultants: cardiology, vascular, GI, and urology. He has good understanding on all these, struggles with transportation, but is eager to keep all these appointments.   __________________________________________________________  Problem List: Patient Active Problem List   Diagnosis Date Noted   Pancreatic mass 11/08/2020    Priority: High   Protein-calorie malnutrition, severe 11/07/2020    Priority: High   Chronic use of opiate for therapeutic purpose 05/01/2015    Priority: High   CAD (coronary artery disease) 03/17/2012    Priority: High   Tobacco use disorder 04/18/2006    Priority: High   Urinary retention 04/02/2018    Priority: Medium   Mild alcohol use disorder 12/09/2016    Priority: Medium   Atherosclerosis of aorta ([place]) 01/13/2015    Priority: Medium   Prediabetes 12/30/2014     Priority: Medium   AAA (abdominal aortic aneurysm) ([place])     Priority: Medium   CKD (chronic kidney disease) stage 2, GFR 60-89 ml/min 08/07/2007    Priority: Medium   Essential hypertension 04/18/2006    Priority: Medium   GERD 04/18/2006    Priority: Medium   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[place]) 11/08/2020    Priority: Low   Premature atrial contractions 07/31/2015    Priority: [place] maintenance 06/30/2012    Priority: Low   Erectile dysfunction 10/12/2010    Priority: Low   Arthropathy of pelvic region and thigh 05/02/2008    Priority: Low   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    Priority: Low   Gout 04/18/2006    Priority: Low   Mobitz (type) I (Wenckebach's) atrioventricular block     Medications: Reconciled today in Epic __________________________________________________________  Physical Exam:  Vital Signs: Vitals:   11/27/20 1117  BP: 125/66  Pulse: 60  Temp: 98.2 F (36.8 C)  TempSrc: Oral  SpO2: 100%  Weight: 141 lb 8 oz (64.2 kg)  Height: 6\' 1"  (1.854 m)    Gen: Fail appearing man CV: RRR, no murmurs Pulm: Normal effort, CTA throughout, no wheezing Abd: Soft, NT, ND Ext: Warm, no edema, normal joints

## 2020-11-27 NOTE — Assessment & Plan Note (Signed)
Chronic urinary retention with unknown cause, has a urethral catheter in place. No symptoms or complications of that catheter. I have refilled flomax. He is going to follow up with urology later this week for a voiding trial.

## 2020-11-27 NOTE — Assessment & Plan Note (Signed)
Inconsistent imaging results of CT and MRI abdomen at recent hospitalization. He has some unintentional weightloss, but several other comorbidities that could account for it. At this point we are monitoring his overall progress. He is going to follow up with Dr. [name] as an outpatient and further evaluation may include an EUS of the pancreas with biopsy of the ampulla. Giving it more time and repeat imaging may be an additional tool to figure out if this is a real malignancy.

## 2020-11-27 NOTE — Assessment & Plan Note (Signed)
Intermittent AV block during a significant illness, likely reflecting transient nodal ischemia or overall physiologic stress. He has had no further symptoms. He had an ambulatory monitor that he wore for 3 days, just sent back, no results yet. I anticipate his arrhythmia issues will resolve now that his illness is resolved.

## 2020-11-27 NOTE — Assessment & Plan Note (Signed)
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 Seems to be improving since starting Pancreatic Enzyme replacement. He has gained 7 pounds in the last two weeks. Eating well. I anticipate this is going to continue to improve.

## 2020-11-29 DIAGNOSIS — I509 Heart failure, unspecified: Secondary | ICD-10-CM | POA: Diagnosis not present

## 2020-11-29 DIAGNOSIS — I493 Ventricular premature depolarization: Secondary | ICD-10-CM | POA: Diagnosis not present

## 2020-11-29 DIAGNOSIS — N182 Chronic kidney disease, stage 2 (mild): Secondary | ICD-10-CM | POA: Diagnosis not present

## 2020-11-29 DIAGNOSIS — I2510 Atherosclerotic heart disease of native coronary artery without angina pectoris: Secondary | ICD-10-CM | POA: Diagnosis not present

## 2020-11-29 DIAGNOSIS — M1991 Primary osteoarthritis, unspecified site: Secondary | ICD-10-CM | POA: Diagnosis not present

## 2020-11-29 DIAGNOSIS — C259 Malignant neoplasm of pancreas, unspecified: Secondary | ICD-10-CM | POA: Diagnosis not present

## 2020-11-29 DIAGNOSIS — A4159 Other Gram-negative sepsis: Secondary | ICD-10-CM | POA: Diagnosis not present

## 2020-11-29 DIAGNOSIS — M109 Gout, unspecified: Secondary | ICD-10-CM | POA: Diagnosis not present

## 2020-11-29 DIAGNOSIS — R002 Palpitations: Secondary | ICD-10-CM | POA: Diagnosis not present

## 2020-11-29 DIAGNOSIS — I130 Hypertensive heart and chronic kidney disease with heart failure and stage 1 through stage 4 chronic kidney disease, or unspecified chronic kidney disease: Secondary | ICD-10-CM | POA: Diagnosis not present

## 2020-11-29 DIAGNOSIS — D631 Anemia in chronic kidney disease: Secondary | ICD-10-CM | POA: Diagnosis not present

## 2020-12-01 ENCOUNTER — Telehealth: Payer: Self-pay | Admitting: *Deleted

## 2020-12-01 ENCOUNTER — Encounter: Payer: Self-pay | Admitting: *Deleted

## 2020-12-01 DIAGNOSIS — R9431 Abnormal electrocardiogram [ECG] [EKG]: Secondary | ICD-10-CM

## 2020-12-01 DIAGNOSIS — R002 Palpitations: Secondary | ICD-10-CM

## 2020-12-01 DIAGNOSIS — I493 Ventricular premature depolarization: Secondary | ICD-10-CM

## 2020-12-01 DIAGNOSIS — I2510 Atherosclerotic heart disease of native coronary artery without angina pectoris: Secondary | ICD-10-CM

## 2020-12-01 DIAGNOSIS — I472 Ventricular tachycardia, unspecified: Secondary | ICD-10-CM

## 2020-12-01 DIAGNOSIS — I4891 Unspecified atrial fibrillation: Secondary | ICD-10-CM

## 2020-12-01 NOTE — Telephone Encounter (Signed)
RE: pt needs lexiscan done per Dr. Johney Frame Received: Today Dario Guardian, LPN [facility]!!! I have him scheduled for 12/19/20.

## 2020-12-01 NOTE — Telephone Encounter (Signed)
Thank you so much for arranging. Looks like he is too high risk to start AC at this time given anemia, thrombocytopenia, frailty. Will continue to reassess. No changes in meds at this time.

## 2020-12-01 NOTE — Telephone Encounter (Signed)
-----   Message from Freada Bergeron, MD sent at 11/30/2020  4:50 PM EDT ----- His monitor does appear to show an abnormal rhythm called atrial fibrillation. He was previously deemed to be at too high risk for bleed to start a blood thinner. I will reach out to the GI and PCP teams to figure out if he is safe to start. He also is having runs of an abnormal rhythm from the bottom chamber of the heart called ventricular tachycardia. To work this up further, can we get a lexiscan? We can't start a beta blocker due to low hear rates in the hospital. Ultimately, we may have him see EP to discuss medication options going forward.

## 2020-12-01 NOTE — Telephone Encounter (Signed)
Pt aware that per Dr. Johney Frame, looks like he is too high risk to start [facility] at this time, given anemia, thrombocytopenia, frailty.  Informed the pt that we will continue to reassess, and no changes in meds at this time. Pt verbalized understanding and agrees with this plan.

## 2020-12-01 NOTE — Telephone Encounter (Signed)
Spoke with the pt and endorsed to him, his monitor results and recommendations per Dr. Johney Frame. Informed the pt that we will follow-up with him soon about further advisement on starting a blood thinner or not, based on what GI and PCP say.  Also informed him that we will follow-up with him if he needs to be referred to see one of our EP Providers or not.  Did go ahead and order for the pt to get a lexiscan done.  He is aware that [facility] Scheduling will call him soon, to arrange this appt. He is also aware that I will type his lexiscan instructions up, and our [place] Dept will call him with those, once his lexi is scheduled. Pt verbalized understanding and agrees with this plan.  Will send this message to Dr. Johney Frame to make her aware lexi is ordered, and attestation order is ready to be signed in this encounter.

## 2020-12-04 ENCOUNTER — Encounter: Payer: Medicare HMO | Admitting: Student in an Organized Health Care Education/Training Program

## 2020-12-05 ENCOUNTER — Other Ambulatory Visit: Payer: Self-pay

## 2020-12-05 ENCOUNTER — Emergency Department (HOSPITAL_COMMUNITY)
Admission: EM | Admit: 2020-12-05 | Discharge: 2020-12-06 | Disposition: A | Discharge status: Home / Self Care | Payer: Medicare HMO | Attending: Emergency Medicine | Admitting: Emergency Medicine

## 2020-12-05 ENCOUNTER — Encounter (HOSPITAL_COMMUNITY): Payer: Self-pay | Admitting: Emergency Medicine

## 2020-12-05 DIAGNOSIS — I2510 Atherosclerotic heart disease of native coronary artery without angina pectoris: Secondary | ICD-10-CM | POA: Diagnosis not present

## 2020-12-05 DIAGNOSIS — Z85118 Personal history of other malignant neoplasm of bronchus and lung: Secondary | ICD-10-CM | POA: Insufficient documentation

## 2020-12-05 DIAGNOSIS — I129 Hypertensive chronic kidney disease with stage 1 through stage 4 chronic kidney disease, or unspecified chronic kidney disease: Secondary | ICD-10-CM | POA: Diagnosis not present

## 2020-12-05 DIAGNOSIS — N182 Chronic kidney disease, stage 2 (mild): Secondary | ICD-10-CM | POA: Insufficient documentation

## 2020-12-05 DIAGNOSIS — R339 Retention of urine, unspecified: Secondary | ICD-10-CM | POA: Insufficient documentation

## 2020-12-05 DIAGNOSIS — F17210 Nicotine dependence, cigarettes, uncomplicated: Secondary | ICD-10-CM | POA: Insufficient documentation

## 2020-12-05 DIAGNOSIS — Z7982 Long term (current) use of aspirin: Secondary | ICD-10-CM | POA: Diagnosis not present

## 2020-12-05 DIAGNOSIS — R338 Other retention of urine: Secondary | ICD-10-CM

## 2020-12-05 DIAGNOSIS — R3914 Feeling of incomplete bladder emptying: Secondary | ICD-10-CM | POA: Diagnosis not present

## 2020-12-05 NOTE — ED Provider Notes (Signed)
[facility] DEPT Provider Note   CSN: [id number] Arrival date & time: 12/05/20  2014     History Chief Complaint  Patient presents with   Urinary Retention    [name] is a 75 y.o. male.  The history is provided by the patient.  He has history of hypertension, chronic kidney disease, prediabetes, coronary artery disease and comes in because of inability to urinate.  He had a urinary catheter placed about 3 weeks ago and had it removed at 11 AM.  He has unable to urinate since then.  He denies abdominal pain, flank pain, fever, chills, sweats.   Past Medical History:  Diagnosis Date   AAA (abdominal aortic aneurysm) ([place])    followed by vasc surgery   Alcohol abuse    Barretts esophagus    bx distal esophagus 2011 neg    Bladder outlet obstruction 04/02/2018   CAD in native artery    a. s/p stent to prox RCA and mid Cx.   Cataract    Cholelithiasis    Chronic osteomyelitis involving ankle and foot, right ([place]) 07/13/2018   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   2006 - 3 adenomas largest 12 mm  2008 - no polyps 2013-  2 mm cecal polyp, adenoma, routine repeat colonoscopy 02/2017 approx 05/14/2017 18 polyps max 12 mm ALL adenomas recall 2019 [name], MD, [facility]      Diverticulosis of colon    DJD (degenerative joint disease)    left hip   Full dentures    upper and lower   GERD (gastroesophageal reflux disease)    diet controlled    Gout    feet   Hiatal hernia    2 cm noted on upper endos 2011    Hx of adenomatous colonic polyps    Hyperlipidemia    Hypertension    Internal hemorrhoid    Lung cancer ([place])    squamous cell (T2N0MX), 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 pt was on Plavix x 3 years then transitioned to ASA, no problems since   Non-small cell carcinoma of right lung, stage 1 ([place]) 01/30/2016   Renal failure, acute ([facility])    hx of 2/2 medication,  resolved   Tobacco abuse    smoker .5 ppd x 55 yrs   UTI (lower urinary tract infection)     Patient Active Problem List   Diagnosis Date Noted   Mobitz (type) I (Wenckebach's) atrioventricular block    Pancreatic mass 11/08/2020   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[place]) 11/08/2020   Protein-calorie malnutrition, severe 11/07/2020   Urinary retention 04/02/2018   Mild alcohol use disorder 12/09/2016   Premature atrial contractions 07/31/2015   Chronic use of opiate for therapeutic purpose 05/01/2015   Atherosclerosis of aorta ([place]) 01/13/2015   Prediabetes 12/30/2014   AAA (abdominal aortic aneurysm) ([place])    Healthcare maintenance 06/30/2012   CAD (coronary artery disease) 03/17/2012   Erectile dysfunction 10/12/2010   Arthropathy of pelvic region and thigh 05/02/2008   CKD (chronic kidney disease) stage 2, GFR 60-89 ml/min 08/07/2007   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   Gout 04/18/2006   Tobacco use disorder 04/18/2006   Essential hypertension 04/18/2006   GERD 04/18/2006    Past Surgical History:  Procedure Laterality Date   BIOPSY  11/12/2020   Procedure: BIOPSY;  Surgeon: Doran Stabler, MD;  Location: [facility]  ENDOSCOPY;  Service: Gastroenterology;;   cataract surgery Bilateral 2018   removal of cataracts   COLONOSCOPY     hx polyps - [name] 02/2012   COLONOSCOPY WITH PROPOFOL N/A 11/12/2020   Procedure: COLONOSCOPY WITH PROPOFOL;  Surgeon: Doran Stabler, MD;  Location: [facility];  Service: Gastroenterology;  Laterality: N/A;   CORONARY ANGIOPLASTY WITH STENT PLACEMENT     ESOPHAGOGASTRODUODENOSCOPY (EGD) WITH PROPOFOL N/A 11/12/2020   Procedure: ESOPHAGOGASTRODUODENOSCOPY (EGD) WITH PROPOFOL;  Surgeon: Doran Stabler, MD;  Location: [facility];  Service: Gastroenterology;  Laterality: N/A;   flexible cystourethroscopy  05/24/06   INGUINAL HERNIA REPAIR  2005   LUNG LOBECTOMY Right 11/07   RUL   MULTIPLE TOOTH EXTRACTIONS     POLYPECTOMY   11/12/2020   Procedure: POLYPECTOMY;  Surgeon: Doran Stabler, MD;  Location: [facility] ENDOSCOPY;  Service: Gastroenterology;;   UPPER GASTROINTESTINAL ENDOSCOPY         Family History  Problem Relation Age of Onset   Diabetes Mother    Hypertension Sister    Heart disease Sister    Diabetes Sister    Arthritis Brother    Gout Brother    Colon cancer Neg Hx    Esophageal cancer Neg Hx    Rectal cancer Neg Hx    Stomach cancer Neg Hx    Colon polyps Neg Hx     Social History   Tobacco Use   Smoking status: Every Day    Packs/day: 0.25    Years: 55.00    Pack years: 13.75    Types: Cigarettes   Smokeless tobacco: Never  Vaping Use   Vaping Use: Never used  Substance Use Topics   Alcohol use: Yes    Alcohol/week: 0.0 standard drinks    Comment: drinking 1-2 pts gin/week, (mixed with tap water not tonic)   Drug use: No    Home Medications Prior to Admission medications   Medication Sig Start Date End Date Taking? Authorizing Provider  albuterol (VENTOLIN HFA) 108 (90 Base) MCG/ACT inhaler Inhale 1-2 puffs into the lungs every 6 (six) hours as needed for wheezing or shortness of breath. 11/13/20   Jeralyn Bennett, MD  allopurinol (ZYLOPRIM) 100 MG tablet Take 1 tablet (100 mg total) by mouth at bedtime. 11/16/20   Axel Filler, MD  aspirin EC 81 MG tablet Take 1 tablet (81 mg total) by mouth daily. Swallow whole. 11/20/20   Freada Bergeron, MD  HYDROcodone-acetaminophen (NORCO) 7.5-325 MG tablet Take 1 tablet by mouth 2 (two) times daily as needed for moderate pain. 11/15/20   Axel Filler, MD  lipase/protease/amylase 20000-63000 units CPEP Take 60,000 Units by mouth 3 (three) times daily before meals. 11/13/20   Jeralyn Bennett, MD  Multiple Vitamin (MULTIVITAMIN WITH MINERALS) TABS tablet Take 1 tablet by mouth daily.    [provider]  rosuvastatin (CRESTOR) 20 MG tablet Take 1 tablet (20 mg total) by mouth daily. 11/27/20   Axel Filler, MD  tamsulosin (FLOMAX) 0.4 MG CAPS capsule Take 2 capsules (0.8 mg total) by mouth every evening. 11/27/20   Axel Filler, MD    Allergies    Candesartan cilexetil-hctz, Hydrochlorothiazide, and Shellfish allergy  Review of Systems   Review of Systems  All other systems reviewed and are negative.  Physical Exam Updated Vital Signs BP 122/81 (BP Location: Right Arm)   Pulse 64   Temp 98 F (36.7 C) (Oral)   Resp 14   SpO2  96%   Physical Exam Vitals and nursing note reviewed.  75 year old male, resting comfortably and in no acute distress. Vital signs are normal. Oxygen saturation is 96%, which is normal. Head is normocephalic and atraumatic. PERRLA, EOMI. Oropharynx is clear. Neck is nontender and supple without adenopathy or JVD. Back is nontender and there is no CVA tenderness. Lungs are clear without rales, wheezes, or rhonchi. Chest is nontender. Heart has regular rate and rhythm without murmur. Abdomen is soft, flat, nontender without masses or hepatosplenomegaly and peristalsis is normoactive.  Bladder is slightly distended. Genitalia: Circumcised penis with hypospadias present. Extremities have no cyanosis or edema, full range of motion is present. Skin is warm and dry without rash. Neurologic: Mental status is normal, cranial nerves are intact, there are no motor or sensory deficits.  ED Results / Procedures / Treatments   Labs (all labs ordered are listed, but only abnormal results are displayed) Labs Reviewed  URINE CULTURE  URINALYSIS, ROUTINE W REFLEX MICROSCOPIC   Procedures Procedures   Medications Ordered in ED Medications - No data to display  ED Course  I have reviewed the triage vital signs and the nursing notes.  Pertinent lab results that were available during my care of the patient were reviewed by me and considered in my medical decision making (see chart for details).   MDM Rules/Calculators/A&P                          Urinary retention status post removal of Foley catheter.  Bladder scan shows 630 mL in the bladder.  New Foley catheter is placed and he is discharged with instructions to follow-up with his his urologist.  Old records are reviewed showing hospital discharge on 11/13/2020.  Final Clinical Impression(s) / ED Diagnoses Final diagnoses:  Acute urinary retention    Rx / DC Orders ED Discharge Orders     None        Delora Fuel, MD 81/44/81 [phone]

## 2020-12-05 NOTE — ED Provider Notes (Signed)
Emergency Medicine Provider Triage Evaluation Note  JOESEPH [name] , a 75 y.o. male  was evaluated in triage.  Pt complains of urinary retention. Had catheter taken out at 11AM, has not urinated since and feels he needs to.  Review of Systems  Positive: Urinary retention Negative: Fever, vomiting, back pain, abdominal pain.   Physical Exam  BP 122/81 (BP Location: Right Arm)   Pulse 64   Temp 98 F (36.7 C) (Oral)   Resp 14   SpO2 96%  Gen:   Awake, no distress   Resp:  Normal effort  MSK:   Moves extremities without difficulty  Abd:   No peritoneal signs.   Medical Decision Making  Medically screening exam initiated at 10:16 PM.  Appropriate orders placed.  [name] was informed that the remainder of the evaluation will be completed by another provider, this initial triage assessment does not replace that evaluation, and the importance of remaining in the ED until their evaluation is complete.  Difficulty urinating.  Will bladder scan.    Leafy Kindle 12/05/20 2217    Malvin Johns, MD 12/05/20 [phone]

## 2020-12-05 NOTE — ED Triage Notes (Signed)
Pt had catheter removed by primary this morning. Has not been able to urinate since 11am. Denies abdominal pain

## 2020-12-06 DIAGNOSIS — R339 Retention of urine, unspecified: Secondary | ICD-10-CM | POA: Diagnosis not present

## 2020-12-06 LAB — URINALYSIS, ROUTINE W REFLEX MICROSCOPIC
Bilirubin Urine: NEGATIVE
Glucose, UA: NEGATIVE mg/dL
Hgb urine dipstick: NEGATIVE
Ketones, ur: NEGATIVE mg/dL
Nitrite: NEGATIVE
Protein, ur: NEGATIVE mg/dL
Specific Gravity, Urine: 1.01 (ref 1.005–1.030)
WBC, UA: >50 WBC/hpf — ABNORMAL HIGH (ref 0–5)
pH: 5 (ref 5.0–8.0)

## 2020-12-06 NOTE — ED Notes (Signed)
Leg bag applied to patient before discharge.

## 2020-12-07 ENCOUNTER — Telehealth: Payer: Self-pay | Admitting: Student in an Organized Health Care Education/Training Program

## 2020-12-07 DIAGNOSIS — M109 Gout, unspecified: Secondary | ICD-10-CM | POA: Diagnosis not present

## 2020-12-07 DIAGNOSIS — I509 Heart failure, unspecified: Secondary | ICD-10-CM | POA: Diagnosis not present

## 2020-12-07 DIAGNOSIS — I2510 Atherosclerotic heart disease of native coronary artery without angina pectoris: Secondary | ICD-10-CM | POA: Diagnosis not present

## 2020-12-07 DIAGNOSIS — C259 Malignant neoplasm of pancreas, unspecified: Secondary | ICD-10-CM | POA: Diagnosis not present

## 2020-12-07 DIAGNOSIS — A4159 Other Gram-negative sepsis: Secondary | ICD-10-CM | POA: Diagnosis not present

## 2020-12-07 DIAGNOSIS — I130 Hypertensive heart and chronic kidney disease with heart failure and stage 1 through stage 4 chronic kidney disease, or unspecified chronic kidney disease: Secondary | ICD-10-CM | POA: Diagnosis not present

## 2020-12-07 DIAGNOSIS — N182 Chronic kidney disease, stage 2 (mild): Secondary | ICD-10-CM | POA: Diagnosis not present

## 2020-12-07 DIAGNOSIS — D631 Anemia in chronic kidney disease: Secondary | ICD-10-CM | POA: Diagnosis not present

## 2020-12-07 DIAGNOSIS — M1991 Primary osteoarthritis, unspecified site: Secondary | ICD-10-CM | POA: Diagnosis not present

## 2020-12-07 NOTE — Telephone Encounter (Signed)
Rec'd call from [facility] with [facility] calling  in reference to [facility] Orders that were faxed.

## 2020-12-07 NOTE — Telephone Encounter (Signed)
Returned call to [employer]. No answer. Left message on VM requesting return call with order numbers that she is needing signatures on.Marland Kitchen

## 2020-12-08 LAB — URINE CULTURE: Culture: >=100000 — AB

## 2020-12-09 ENCOUNTER — Telehealth: Payer: Self-pay | Admitting: Emergency Medicine

## 2020-12-09 NOTE — Telephone Encounter (Signed)
Post ED Visit - Positive Culture Follow-up: Successful Patient Follow-Up  Culture assessed and recommendations reviewed by:  []  Elenor Quinones, Pharm.D. []  Heide Guile, Pharm.D., BCPS AQ-ID []  Parks Neptune, Pharm.D., BCPS []  Alycia Rossetti, Pharm.D., BCPS []  [place], Pharm.D., BCPS, AAHIVP []  Legrand Como, Pharm.D., BCPS, AAHIVP []  Salome Arnt, PharmD, BCPS []  Johnnette Gourd, PharmD, BCPS []  Hughes Better, PharmD, BCPS []  Leeroy Cha, PharmD  Positive urine culture  [x]  Patient discharged without antimicrobial prescription and treatment is now indicated []  Organism is resistant to prescribed ED discharge antimicrobial []  Patient with positive blood cultures  Changes discussed with ED provider: Alfredia Client PA New antibiotic prescription start amoxicillin 500mg  po tid x 5 days Called to [facility] @ [phone]  Contacted patient, date 12/09/2020, time [place] 12/09/2020, 11:25 AM

## 2020-12-09 NOTE — Progress Notes (Addendum)
ED Antimicrobial Stewardship Positive Culture Follow Up   [name] is an 75 y.o. male who presented to [facility] on 12/05/2020 with a chief complaint of  Chief Complaint  Patient presents with   Urinary Retention    Recent Results (from the past 720 hour(s))  Urine culture     Status: Abnormal   Collection Time: 12/06/20 12:00 AM   Specimen: Urine, Random  Result Value Ref Range Status   Specimen Description   Final    URINE, RANDOM Performed at [facility], [place] [address] [place], [place] [address]    Special Requests   Final    NONE Performed at [facility], [place] [address], [place], [place] [address]    Culture >=100,000 COLONIES/mL ENTEROCOCCUS FAECALIS (A)  Final   Report Status 12/08/2020 FINAL  Final   Organism ID, Bacteria ENTEROCOCCUS FAECALIS (A)  Final      Susceptibility   Enterococcus faecalis - MIC*    AMPICILLIN <=2 SENSITIVE Sensitive     NITROFURANTOIN <=16 SENSITIVE Sensitive     VANCOMYCIN 1 SENSITIVE Sensitive     * >=100,000 COLONIES/mL ENTEROCOCCUS FAECALIS   [x]  Patient discharged originally without antimicrobial agent and treatment is now indicated  New antibiotic prescription: Amoxicillin 500mg  PO TID x 5 days  ED Provider: Alfredia Client, PA-C   Peggyann Juba, PharmD, BCPS 12/09/2020, 9:58 AM Clinical Pharmacist [phone]

## 2020-12-12 ENCOUNTER — Other Ambulatory Visit: Payer: Self-pay | Admitting: Student in an Organized Health Care Education/Training Program

## 2020-12-13 ENCOUNTER — Telehealth (HOSPITAL_COMMUNITY): Payer: Self-pay

## 2020-12-13 DIAGNOSIS — C259 Malignant neoplasm of pancreas, unspecified: Secondary | ICD-10-CM | POA: Diagnosis not present

## 2020-12-13 DIAGNOSIS — I509 Heart failure, unspecified: Secondary | ICD-10-CM | POA: Diagnosis not present

## 2020-12-13 DIAGNOSIS — D631 Anemia in chronic kidney disease: Secondary | ICD-10-CM | POA: Diagnosis not present

## 2020-12-13 DIAGNOSIS — I130 Hypertensive heart and chronic kidney disease with heart failure and stage 1 through stage 4 chronic kidney disease, or unspecified chronic kidney disease: Secondary | ICD-10-CM | POA: Diagnosis not present

## 2020-12-13 DIAGNOSIS — N182 Chronic kidney disease, stage 2 (mild): Secondary | ICD-10-CM | POA: Diagnosis not present

## 2020-12-13 DIAGNOSIS — M1991 Primary osteoarthritis, unspecified site: Secondary | ICD-10-CM | POA: Diagnosis not present

## 2020-12-13 DIAGNOSIS — I2510 Atherosclerotic heart disease of native coronary artery without angina pectoris: Secondary | ICD-10-CM | POA: Diagnosis not present

## 2020-12-13 DIAGNOSIS — A4159 Other Gram-negative sepsis: Secondary | ICD-10-CM | POA: Diagnosis not present

## 2020-12-13 DIAGNOSIS — M109 Gout, unspecified: Secondary | ICD-10-CM | POA: Diagnosis not present

## 2020-12-13 NOTE — Telephone Encounter (Signed)
Attempted to contact the patient to leave instructions for his stress test. No answering machine or answer. Will try again later. S.Panayiotis [place] EMTP

## 2020-12-19 ENCOUNTER — Other Ambulatory Visit: Payer: Self-pay

## 2020-12-19 ENCOUNTER — Ambulatory Visit (HOSPITAL_COMMUNITY): Payer: Medicare HMO | Attending: Cardiovascular Disease

## 2020-12-19 ENCOUNTER — Encounter (INDEPENDENT_AMBULATORY_CARE_PROVIDER_SITE_OTHER): Payer: Self-pay

## 2020-12-19 DIAGNOSIS — R9431 Abnormal electrocardiogram [ECG] [EKG]: Secondary | ICD-10-CM | POA: Diagnosis not present

## 2020-12-19 DIAGNOSIS — I472 Ventricular tachycardia, unspecified: Secondary | ICD-10-CM

## 2020-12-19 DIAGNOSIS — R002 Palpitations: Secondary | ICD-10-CM | POA: Diagnosis not present

## 2020-12-19 DIAGNOSIS — M25552 Pain in left hip: Secondary | ICD-10-CM

## 2020-12-19 DIAGNOSIS — I2510 Atherosclerotic heart disease of native coronary artery without angina pectoris: Secondary | ICD-10-CM | POA: Diagnosis not present

## 2020-12-19 DIAGNOSIS — I493 Ventricular premature depolarization: Secondary | ICD-10-CM | POA: Diagnosis not present

## 2020-12-19 DIAGNOSIS — I4891 Unspecified atrial fibrillation: Secondary | ICD-10-CM | POA: Insufficient documentation

## 2020-12-19 DIAGNOSIS — Z79891 Long term (current) use of opiate analgesic: Secondary | ICD-10-CM

## 2020-12-19 LAB — MYOCARDIAL PERFUSION IMAGING
LV dias vol: 93 mL (ref 62–150)
LV sys vol: 49 mL
Peak HR: 96 {beats}/min
Rest HR: 72 {beats}/min
SDS: 1
SRS: 3
SSS: 4
TID: 0.88

## 2020-12-19 MED ORDER — REGADENOSON 0.4 MG/5ML IV SOLN
0.4000 mg | Freq: Once | INTRAVENOUS | Status: AC
  Administered 2020-12-19: 0.4 mg via INTRAVENOUS

## 2020-12-19 MED ORDER — TECHNETIUM TC 99M TETROFOSMIN IV KIT
29.6000 | PACK | Freq: Once | INTRAVENOUS | Status: AC | PRN
Start: 2020-12-19 — End: 2020-12-19
  Administered 2020-12-19: 29.6 via INTRAVENOUS
  Filled 2020-12-19: qty 30

## 2020-12-19 MED ORDER — HYDROCODONE-ACETAMINOPHEN 7.5-325 MG PO TABS: 1.0000 | ORAL_TABLET | Freq: Two times a day (BID) | ORAL | 0 refills | Status: DC | PRN

## 2020-12-19 MED ORDER — TECHNETIUM TC 99M TETROFOSMIN IV KIT
10.9000 | PACK | Freq: Once | INTRAVENOUS | Status: AC | PRN
Start: 2020-12-19 — End: 2020-12-19
  Administered 2020-12-19: 10.9 via INTRAVENOUS
  Filled 2020-12-19: qty 11

## 2020-12-19 NOTE — Telephone Encounter (Signed)
Pt is requesting his HYDROcodone-acetaminophen ([place]) 7.5-325 MG tablet [facility] to  [id number] [place] [place] Phone:  [phone]  Fax:  [phone]

## 2021-01-02 DIAGNOSIS — R338 Other retention of urine: Secondary | ICD-10-CM | POA: Diagnosis not present

## 2021-01-12 NOTE — Progress Notes (Deleted)
Cardiology Office Note:    Date:  01/12/2021   ID:  EMMANUELL [name], DOB 08-28-1945, MRN [id number]  PCP:  Axel Filler, MD   [facility] Providers Cardiologist:  None {   Referring MD: Axel Filler    History of Present Illness:    [name] is a 75 y.o. 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  Per review of the record, patient has history of CAD with remote NSTEMI in 2004 s/p DES to proximal RCA and staged PCI to mid LCx. There was moderate residual disease in the distal LCx. LM, LAD were OK, LVEF 60% at that time. Saw Dr. Harrington Challenger in the office once in 2016 for follow-up of CAD. He had NSIVCD on EKG at that time with occasional PACs. Subsequent tracings during admissions for other issues showed some degree of conduction abnormality with episodic bradycardia (see 2017, 2020). 2020 tracing showed RBBB/LAFB pattern.  He was admitted 11/06/2020 with progressive weakness, poor appetite, weight loss, and constipation. 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 hsTroponin low/flat [phone] and TTE 11/06/20 reassuring with normal LVEF. He was on heparin for PVT - anticoagulation recommended by vascular surgery, which  was later stopped as he was stopped as he was felt to be poor candidate for anticoagulation. Cardiology was consulted due to bradycardia with HR 30-40s on the monitor. He was on BB at this time. His nodal agent was stopped and his HR improved. Patient remained asymptomatic.   Last seen in clinic on 11/20/20 where he was feeling much better. Cardiac monitor 11/29/20 with episodes of possible Afib and episodes of nonconducted PACs, 27 episodes of VT (however, may be AIVR due to lower rate). Myoview 12/19/20 with LVEF 47%, no evidence of ischemia or infarction. TTE 11/06/20 with LVEF 55-60%, G1DD, no significant valve disease.  Today  Past Medical History:  Diagnosis Date   AAA (abdominal aortic aneurysm) ([place])    followed by vasc surgery   Alcohol abuse    Barretts esophagus    bx distal esophagus 2011 neg    Bladder outlet obstruction 04/02/2018   CAD in native artery    a. s/p stent to prox RCA and mid Cx.   Cataract    Cholelithiasis    Chronic osteomyelitis involving ankle and foot, right ([place]) 07/13/2018   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   2006 - 3 adenomas largest 12 mm  2008 - no polyps 2013-  2 mm cecal polyp, adenoma, routine repeat colonoscopy 02/2017 approx 05/14/2017 18 polyps max 12 mm ALL adenomas recall 2019 [name] Mayer, MD, [facility]      Diverticulosis of colon    DJD (degenerative joint disease)    left hip   Full dentures    upper and lower   GERD (gastroesophageal reflux disease)    diet controlled    Gout    feet   Hiatal hernia  2 cm noted on upper endos 2011    Hx of adenomatous colonic polyps    Hyperlipidemia    Hypertension    Internal hemorrhoid    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 pt was on Plavix x 3 years then transitioned to ASA, no problems since   Non-small cell carcinoma of right lung, stage 1 ([place]) 01/30/2016   Renal failure,  acute ([facility])    hx of 2/2 medication, resolved   Tobacco abuse    smoker .5 ppd x 55 yrs   UTI (lower urinary tract infection)     Past Surgical History:  Procedure Laterality Date   BIOPSY  11/12/2020   Procedure: BIOPSY;  Surgeon: Doran Stabler, MD;  Location: [place];  Service: Gastroenterology;;   cataract surgery Bilateral 2018   removal of cataracts   COLONOSCOPY     hx polyps - [name] 02/2012   COLONOSCOPY WITH PROPOFOL N/A 11/12/2020   Procedure: COLONOSCOPY WITH PROPOFOL;  Surgeon: Doran Stabler, MD;  Location: [place];  Service: Gastroenterology;  Laterality: N/A;   CORONARY ANGIOPLASTY WITH STENT PLACEMENT     ESOPHAGOGASTRODUODENOSCOPY (EGD) WITH PROPOFOL N/A 11/12/2020   Procedure: ESOPHAGOGASTRODUODENOSCOPY (EGD) WITH PROPOFOL;  Surgeon: Doran Stabler, MD;  Location: [place];  Service: Gastroenterology;  Laterality: N/A;   flexible cystourethroscopy  05/24/06   INGUINAL HERNIA REPAIR  2005   LUNG LOBECTOMY Right 11/07   RUL   MULTIPLE TOOTH EXTRACTIONS     POLYPECTOMY  11/12/2020   Procedure: POLYPECTOMY;  Surgeon: Doran Stabler, MD;  Location: [place];  Service: Gastroenterology;;   UPPER GASTROINTESTINAL ENDOSCOPY      Current Medications: No outpatient medications have been marked as taking for the 01/18/21 encounter (Appointment) with Freada Bergeron, MD.     Allergies:   Candesartan cilexetil-hctz, Hydrochlorothiazide, and Shellfish allergy   Social History   Socioeconomic History   Marital status: Single    Spouse name: Not on file   Number of children: 0   Years of education: Not on file   Highest education level: Not on file  Occupational History   Not on file  Tobacco Use   Smoking status: Every Day    Packs/day: 0.25    Years: 55.00    Pack years: 13.75    Types: Cigarettes   Smokeless tobacco: Never  Vaping Use   Vaping Use: Never used  Substance and Sexual Activity   Alcohol use: Yes     Alcohol/week: 0.0 standard drinks    Comment: drinking 1-2 pts gin/week, (mixed with tap water not tonic)   Drug use: No   Sexual activity: Not on file  Other Topics Concern   Not on file  Social History Narrative   Not on file   Social Determinants of Health   Financial Resource Strain: Not on file  Food Insecurity: Not on file  Transportation Needs: Not on file  Physical Activity: Not on file  Stress: Not on file  Social Connections: Not on file     Family History: The patient's family history includes Arthritis in his brother; Diabetes in his mother and sister; Gout in his brother; Heart disease in his sister; Hypertension in his sister. There is no history of Colon cancer, Esophageal cancer, Rectal cancer, Stomach cancer, or Colon polyps.  ROS:   Please see the history  of present illness.    Review of Systems  Constitutional:  Negative for chills and fever.  HENT:  Negative for hearing loss.   Eyes:  Negative for blurred vision.  Respiratory:  Negative for shortness of breath.   Cardiovascular:  Negative for chest pain, palpitations, orthopnea, claudication, leg swelling and PND.  Gastrointestinal:  Negative for blood in stool, constipation and melena.  Genitourinary:  Negative for hematuria.  Musculoskeletal:  Positive for joint pain. Negative for falls.  Neurological:  Negative for dizziness and loss of consciousness.   EKGs/Labs/Other Studies Reviewed:    The following studies were reviewed today: Myoview 12/19/20: Nuclear stress EF: 47%. The left ventricular ejection fraction is mildly decreased (45-54%). There was no ST segment deviation noted during stress. No T wave inversion was noted during stress. The study is normal. This is a low risk study.   1.  Reduced apical counts with normal wall motion consistent with apical thinning artifact.  No evidence of ischemia or infarction. 2.  Normal LVEF for this modality, 47%. 3.  This is a low risk study.  Cardiac  Monitor 11/29/20: Patch wear time was 2 days and 4 hours Monitor shows probable Afib with some possible episodes of non-conducted PACs Monitor also comments on 27 episodes of VT, however, review of the strips suggests possible accelerated idoventricular rhythm as rate is on the lower end for VT No significant pauses.     Patch Wear Time:  2 days and 4 hours (2022-06-08T15:16:41-0400 to 2022-06-10T19:18:04-399)   27 Ventricular Tachycardia runs occurred, the run with the fastest interval lasting 17.7 secs with a max rate of 174 bpm, the longest lasting 3 mins 15 secs with an avg rate of 108 bpm. Atrial Fibrillation occurred continuously (100% burden), ranging from 37-160 bpm (avg of 81 bpm). Idioventricular Rhythm was present. Isolated VEs were rare (<1.0%, 1986), VE Couplets were rare (<1.0%, 256), and VE Triplets were rare (<1.0%, 79). Ventricular Trigeminy was present.  TTE 11/06/20: IMPRESSIONS     1. Left ventricular ejection fraction, by estimation, is 55 to 60%. The  left ventricle has normal function. Left ventricular endocardial border  not optimally defined to evaluate regional wall motion. There is mild left  ventricular hypertrophy. Left  ventricular diastolic parameters are consistent with Grade I diastolic  dysfunction (impaired relaxation).   2. Right ventricular systolic function was not well visualized. The right  ventricular size is not well visualized. There is normal pulmonary artery  systolic pressure. The estimated right ventricular systolic pressure is  16.0 mmHg.   3. The mitral valve is normal in structure. No evidence of mitral valve  regurgitation. No evidence of mitral stenosis.   4. The aortic valve is tricuspid. Aortic valve regurgitation is not  visualized. No aortic stenosis is present.   5. The inferior vena cava is normal in size with greater than 50%  respiratory variability, suggesting right atrial pressure of 3 mmHg.   6. Technically difficult  study with poor acoustic windows.   Cath 04/2003 RESULTS:  1. Left main coronary artery:  The left main coronary was free of     significant disease.  2. Left anterior descending artery:  The left anterior descending gave rise     to three septal perforators and three diagonal branches.  This vessel was     irregular, but there was no major obstruction.  3. Left circumflex artery:  The left circumflex gave rise to a small     intermediate branch and two  large posterolateral branches.  There was 80%     narrowing in the mid-circumflex artery.  There was 50% narrowing in the     distal circumflex artery before the posterolateral branches.  4. Right coronary artery:  The right coronary artery had an anterior and     inferior-directed takeoff and had several sharp bends in its proximal     portion with diffuse disease with focal narrowing of 90%.  There were a     conus and atrial branch that arose from the lesions.  The right coronary     artery then gave rise to a right ventricular branch, a posterior     descending branch, and two small posterolateral branches.  The distal     vessel was irregular but free of major obstruction.  5. Left ventriculogram:  The left ventriculogram performed in RAO projection     showed mild inferior wall hypokinesis.  The estimated ejection fraction     was 60%.  The aortic pressure was 148/17, left ventricular pressure was     148/80.  6. Following stenting of the lesion in the proximal right coronary artery,     the stenosis improved from 90% to less than 10%.    CONCLUSION:  1. Coronary artery disease, status post recent non-ST segment elevation     myocardial infarction with irregularities in the left anterior descending     artery, 80% mid- and 50% distal stenosis in the circumflex artery, 90%     stenosis in the proximal right coronary artery, with mild inferior wall     hypokinesis and an ejection fraction estimated at 60%.  2. Successful stenting  of the proximal right coronary artery with a bare     metal stent with improvement in sentinel narrowing from 90% to less than     10%.    DISPOSITION:  The patient was returned to the post-angioplasty unit for  further observation.  Will plan to intervene on the circumflex artery in two  days on Wednesday.                                                  Eustace Quail, M.D.   Cath 04/2003 #2   CARDIAC CATHETERIZATION    INDICATIONS FOR PROCEDURE:  Mr. Biss is 75 years old and was admitted to  the hospital with chest pain and enzymes consistent with a non-ST elevation  myocardial infarction.  He underwent a difficult intervention two days ago  of a tight lesion in the right coronary artery with a bare metal stent and  was brought back today for intervention on the circumflex artery.    PROCEDURE:  The procedure was performed via the left femoral artery using  arterial sheath and a CLS-4 6 [place] guiding catheter with side holes.  We  used a PT2 wire light support.  We crossed the lesion with the wire without  difficulty.  We predilated with a 3.0 x 20-mm Maverick performing one  inflation up to 6 atmospheres for 30 seconds.  We then passed a 3.0 x 24-mm  Taxus stent down to the lesion.  We had some difficulty advancing the stent  down to the lesion because of marked tortuosity in the proximal vessel.  However, we were able to accomplish this with less difficulty than  anticipated.  We deployed the  stent with one inflation of  12 atmospheres  for 30 seconds.  We then post dilated with a 3.25 x 20-mm Quantum Maverick  performing one inflation up to 15 atmospheres for 30 seconds.  Repeat  diagnostic study was then performed through the guiding catheter.  The  patient tolerated the procedure well and left the laboratory in satisfactory  condition.    RESULTS:  Initially, the stenosis in the mid circumflex artery was estimated  at 80%.  Following stenting, this improved to less than  10%.    CONCLUSIONS:  Successful stenting of the lesion in the mid circumflex artery  with a Taxus stent with improvement in stent renarrowing from 80% to less  than 10%.    DISPOSITION:  The patient returned to the postangioplasty unit for further  observation.    He will need Plavix for six months and aspirin indefinitely.    TTE 11/06/20  1. Left ventricular ejection fraction, by estimation, is 55 to 60%. The  left ventricle has normal function. Left ventricular endocardial border  not optimally defined to evaluate regional wall motion. There is mild left  ventricular hypertrophy. Left  ventricular diastolic parameters are consistent with Grade I diastolic  dysfunction (impaired relaxation).   2. Right ventricular systolic function was not well visualized. The right  ventricular size is not well visualized. There is normal pulmonary artery  systolic pressure. The estimated right ventricular systolic pressure is  23.7 mmHg.   3. The mitral valve is normal in structure. No evidence of mitral valve  regurgitation. No evidence of mitral stenosis.   4. The aortic valve is tricuspid. Aortic valve regurgitation is not  visualized. No aortic stenosis is present.   5. The inferior vena cava is normal in size with greater than 50%  respiratory variability, suggesting right atrial pressure of 3 mmHg.   6. Technically difficult study with poor acoustic windows.    08/30/20: Summary:  Abdominal Aorta: There is evidence of abnormal dilatation of the mid  Abdominal aorta. The largest aortic diameter remains essentially unchanged  compared to prior exam. Previous diameter measurement was 5.2 cm obtained  on 02/14/2020.  Stenosis:  Mural thrombus seen in mid AAA sac. Common iliac arteries are ectatic  bilaterally.   08/30/20: Summary:  Right: Resting right ankle-brachial index is within normal range. No  evidence of significant right lower extremity arterial disease. The right   toe-brachial index is abnormal. RT great toe pressure = 68 mmHg.   Left: Resting left ankle-brachial index is within normal range. No  evidence of significant left lower extremity arterial disease. The left  toe-brachial index is abnormal. LT Great toe pressure = 83 mmHg.   EKG:  EKG***  Recent Labs: 11/07/2020: TSH 0.986 11/13/2020: ALT 41; BUN 8; Creatinine, Ser 1.28; Hemoglobin 9.1; Magnesium 2.0; Platelets 135; Potassium 4.1; Sodium 138  Recent Lipid Panel    Component Value Date/Time   CHOL 74 11/11/2020 1555   CHOL 112 07/13/2018 1002   TRIG 54 11/11/2020 1555   HDL 20 (L) 11/11/2020 1555   HDL 51 07/13/2018 1002   CHOLHDL 3.7 11/11/2020 1555   VLDL 11 11/11/2020 1555   LDLCALC 43 11/11/2020 1555   LDLCALC 51 07/13/2018 1002     Physical Exam:    VS:  There were no vitals taken for this visit.    Wt Readings from Last 3 Encounters:  12/19/20 141 lb (64 kg)  11/27/20 141 lb 8 oz (64.2 kg)  11/20/20 137 lb 9.6  oz (62.4 kg)     GEN:  Thin, frail appearing HEENT: Normal NECK: No JVD; No carotid bruits CARDIAC: Irregularly irregular, no murmurs, rubs, gallops RESPIRATORY:  Clear to auscultation without rales, wheezing or rhonchi  ABDOMEN: Soft, non-tender, non-distended MUSCULOSKELETAL:  No edema; No deformity  SKIN: Warm and dry NEUROLOGIC:  Alert and oriented x 3 PSYCHIATRIC:  Normal affect   ASSESSMENT:    No diagnosis found.  PLAN:    In order of problems listed above:  #Asymptomatic Sinus Bradycardia: #Known LAFB, RBBB: #Episodes of Mobitz type I AVB Cardiology was consulted during recent admission for asymptomatic bradycardia. Has similar tracings back to 2017 with known RBBB/LAFB. HR improved with discontinuation of beta blocker. Cardiac monitor with HR range 37-160bpm but average 81bpm. No pauses.  -Avoid nodal agents -HR improved  #Afib: #Frequent PACs: Cardiac monitor with episodes of Afib as well as frequent PACs. Given anemia,  thrombocytopenia, frailty and indwelling urinary catheter, AC deemed too high risk as that time.  -No nodal agents due to bradycardia as above -***AC??   #CAD s/p prior MI/PCI: Patient with history of NSTEMI in 2004 s/p DES to proximal RCA and staged PCI to mid LCx. There was moderate residual disease in the distal LCx. LM, LAD were OK. No recent anginal symptoms. TTE 11/06/20 normal EF 55-60%, grade 1 DD, normal PASP.  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 Recommend follow-up CT/MR every 59months and vascular consultation" -Follow-up with vascular surgery as above -Continue ASA 81mg  daily as above  #Non-obstructive portal vein thrombosis:  Not on AC due to concern for high risk of bleed. -Follow-up with vascular surgery as scheduled -Resume ASA as above -Will need GI clearance for statin due to concern for cirrhosis  #History of Enterobacter bacteremia 10/2020 #Deconditioning #Pancreatic Ectasia or IPMN: Overall improving. -Follow-up scheduled per patient with PCP     Medication Adjustments/Labs and Tests Ordered: Current medicines are reviewed at length with the patient today.  Concerns regarding medicines are outlined above.  No orders of the defined types were placed in this encounter.  No orders of the defined types were placed in this encounter.   There are no Patient Instructions on file for this visit.    Signed, Freada Bergeron, MD   01/12/2021 7:49 AM    [facility]

## 2021-01-15 ENCOUNTER — Other Ambulatory Visit: Payer: Self-pay

## 2021-01-15 DIAGNOSIS — Z79891 Long term (current) use of opiate analgesic: Secondary | ICD-10-CM

## 2021-01-15 DIAGNOSIS — M25552 Pain in left hip: Secondary | ICD-10-CM

## 2021-01-15 DIAGNOSIS — M25551 Pain in right hip: Secondary | ICD-10-CM

## 2021-01-15 MED ORDER — HYDROCODONE-ACETAMINOPHEN 7.5-325 MG PO TABS: 1.0000 | ORAL_TABLET | Freq: Two times a day (BID) | ORAL | 0 refills | Status: DC | PRN

## 2021-01-15 NOTE — Telephone Encounter (Signed)
HYDROcodone-acetaminophen (NORCO) 7.5-325 MG tablet, refill request @ [id number] - [place], [place] - [place].

## 2021-01-18 ENCOUNTER — Ambulatory Visit: Payer: Medicare HMO | Admitting: Cardiology

## 2021-01-19 DIAGNOSIS — R3914 Feeling of incomplete bladder emptying: Secondary | ICD-10-CM | POA: Diagnosis not present

## 2021-01-19 DIAGNOSIS — R3912 Poor urinary stream: Secondary | ICD-10-CM | POA: Diagnosis not present

## 2021-01-24 DIAGNOSIS — R3914 Feeling of incomplete bladder emptying: Secondary | ICD-10-CM | POA: Diagnosis not present

## 2021-01-29 ENCOUNTER — Other Ambulatory Visit: Payer: Self-pay | Admitting: Student in an Organized Health Care Education/Training Program

## 2021-01-29 ENCOUNTER — Other Ambulatory Visit: Payer: Self-pay | Admitting: *Deleted

## 2021-01-29 MED ORDER — ALBUTEROL SULFATE HFA 108 (90 BASE) MCG/ACT IN AERS: 1.0000 | INHALATION_SPRAY | Freq: Four times a day (QID) | RESPIRATORY_TRACT | 0 refills | Status: DC | PRN

## 2021-01-29 MED ORDER — PANCRELIPASE (LIP-PROT-AMYL) 20000-63000 UNITS PO CPEP: 60000.0000 [IU] | ORAL_CAPSULE | Freq: Three times a day (TID) | ORAL | 0 refills | Status: DC

## 2021-01-29 NOTE — Telephone Encounter (Signed)
Refill Request   allopurinol (ZYLOPRIM) 100 MG tablet  ZENPEP    [id number] - [place], [place] - [place] (Ph: [phone])

## 2021-01-29 NOTE — Telephone Encounter (Signed)
Allopurinol #90 with 3 refills [facility] to [facility]. Notified patient to contact pharmacy for refill. Will forward refill request for Zenpep.

## 2021-01-30 ENCOUNTER — Ambulatory Visit: Payer: Medicare HMO | Admitting: Internal Medicine

## 2021-02-07 ENCOUNTER — Other Ambulatory Visit: Payer: Self-pay

## 2021-02-07 DIAGNOSIS — M25551 Pain in right hip: Secondary | ICD-10-CM

## 2021-02-07 DIAGNOSIS — M25552 Pain in left hip: Secondary | ICD-10-CM

## 2021-02-07 DIAGNOSIS — Z79891 Long term (current) use of opiate analgesic: Secondary | ICD-10-CM

## 2021-02-07 MED ORDER — HYDROCODONE-ACETAMINOPHEN 7.5-325 MG PO TABS: 1.0000 | ORAL_TABLET | Freq: Two times a day (BID) | ORAL | 0 refills | Status: DC | PRN

## 2021-02-07 NOTE — Telephone Encounter (Signed)
allopurinol (ZYLOPRIM) 100 MG tablet  HYDROcodone-acetaminophen (NORCO) 7.5-325 MG tablet, REFILL REQUEST @ [id number] - [place], [place] - [address] AT [place].

## 2021-02-12 ENCOUNTER — Other Ambulatory Visit: Payer: Self-pay | Admitting: Student in an Organized Health Care Education/Training Program

## 2021-02-21 DIAGNOSIS — R338 Other retention of urine: Secondary | ICD-10-CM | POA: Diagnosis not present

## 2021-02-22 ENCOUNTER — Ambulatory Visit (INDEPENDENT_AMBULATORY_CARE_PROVIDER_SITE_OTHER): Payer: Medicare HMO | Admitting: Family

## 2021-02-22 ENCOUNTER — Other Ambulatory Visit: Payer: Self-pay

## 2021-02-22 ENCOUNTER — Encounter (HOSPITAL_BASED_OUTPATIENT_CLINIC_OR_DEPARTMENT_OTHER): Payer: Self-pay | Admitting: Family

## 2021-02-22 VITALS — BP 120/80 | HR 86 | Ht 73.0 in | Wt 136.0 lb

## 2021-02-22 DIAGNOSIS — I739 Peripheral vascular disease, unspecified: Secondary | ICD-10-CM | POA: Diagnosis not present

## 2021-02-22 DIAGNOSIS — I491 Atrial premature depolarization: Secondary | ICD-10-CM | POA: Diagnosis not present

## 2021-02-22 DIAGNOSIS — I493 Ventricular premature depolarization: Secondary | ICD-10-CM | POA: Diagnosis not present

## 2021-02-22 DIAGNOSIS — I25118 Atherosclerotic heart disease of native coronary artery with other forms of angina pectoris: Secondary | ICD-10-CM | POA: Diagnosis not present

## 2021-02-22 DIAGNOSIS — I480 Paroxysmal atrial fibrillation: Secondary | ICD-10-CM

## 2021-02-22 NOTE — Progress Notes (Signed)
Office Visit    Patient Name: [name] Date of Encounter: 02/22/2021  PCP:  Axel Filler, [place]  Cardiologist:  Freada Bergeron, MD  Advanced Practice Provider:  No care team member to display Electrophysiologist:  None      Chief Complaint    [name] is a 75 y.o. 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 followed by vascular surgery, CKD 2, hypertension, prediabetes, colonic polyposis, chronic opiate use, gout, alcohol use, Barrett's esophagus, lung cancer treated surgically, osteomyelitis, diverticulosis, hiatal hernia, sepsis, bradycardia, questionable paroxysmal atrial fibrillation, VT presents today for follow-up of arrhythmia  Past Medical History    Past Medical History:  Diagnosis Date   AAA (abdominal aortic aneurysm) ([place])    followed by vasc surgery   Alcohol abuse    Barretts esophagus    bx distal esophagus 2011 neg    Bladder outlet obstruction 04/02/2018   CAD in native artery    a. s/p stent to prox RCA and mid Cx.   Cataract    Cholelithiasis    Chronic osteomyelitis involving ankle and foot, right ([place]) 07/13/2018   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   2006 - 3 adenomas largest 12 mm  2008 - no polyps 2013-  2 mm cecal polyp, adenoma, routine repeat colonoscopy 02/2017 approx 05/14/2017 18 polyps max 12 mm ALL adenomas recall 2019 [name], MD, 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 pt was on Plavix x 3 years then transitioned to ASA, no problems since   Non-small cell carcinoma of right lung, stage 1 ([place]) 01/30/2016   Renal failure, acute ([facility])    hx of 2/2 medication, resolved   Tobacco abuse    smoker .5 ppd x 55 yrs   UTI (lower urinary tract infection)    Past Surgical History:  Procedure Laterality Date   BIOPSY  11/12/2020   Procedure: BIOPSY;  Surgeon: Doran Stabler, MD;  Location: [facility];  Service: Gastroenterology;;   cataract surgery Bilateral 2018   removal of cataracts   COLONOSCOPY     hx polyps - [name] 02/2012   COLONOSCOPY WITH PROPOFOL N/A 11/12/2020   Procedure: COLONOSCOPY WITH PROPOFOL;  Surgeon: Doran Stabler, MD;  Location: [facility];  Service: Gastroenterology;  Laterality: N/A;   CORONARY ANGIOPLASTY WITH STENT PLACEMENT     ESOPHAGOGASTRODUODENOSCOPY (EGD) WITH PROPOFOL N/A 11/12/2020   Procedure: ESOPHAGOGASTRODUODENOSCOPY (EGD) WITH PROPOFOL;  Surgeon: Doran Stabler, MD;  Location: [facility];  Service: Gastroenterology;  Laterality: N/A;   flexible cystourethroscopy  05/24/06   INGUINAL HERNIA REPAIR  2005   LUNG LOBECTOMY Right 11/07   RUL   MULTIPLE TOOTH EXTRACTIONS  POLYPECTOMY  11/12/2020   Procedure: POLYPECTOMY;  Surgeon: Doran Stabler, MD;  Location: [facility] ENDOSCOPY;  Service: Gastroenterology;;   UPPER GASTROINTESTINAL ENDOSCOPY      Allergies  Allergies  Allergen Reactions   Candesartan Cilexetil-Hctz Other (See Comments)    Acute renal failure   Hydrochlorothiazide Other (See Comments)    Acute renal failure   Shellfish Allergy Other (See Comments)    Gout occurs    History of Present Illness    [name] is a 75 y.o. 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 Johney Frame.  History of CAD with remote NSTEMI in 2004 with DES to proximal RCA and staged PCI to mid circumflex.  There was moderate residual disease in the left circumflex.  LVEF 60% at the time.  In 2016 seen in follow-up with nonspecific IVCD as well as occasional PAC.  Subsequent EKGs during other admissions with some degree of conduction abnormality with episodic bradycardia and 2020 EKG showing RBBB/LAFB pattern.  He was admitted 11/06/2020 with progressive weakness, poor appetite, weight loss, constipation.  Hospital course notable for sepsis due to Enterobacter materials bacteremia, complicated cystitis, possible pancreatic tumor.  He underwent MRI/MRCP which were not clear neoplastic but clinical scenario was concerning.  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  It was on heparin for PVT and anticoagulation was subsequently stopped as he was felt to be a poor candidate.  Cardiology consulted due to bradycardia with heart rate in the 30s to 40s which resolved with discontinuation of beta-blocker.  He was seen in follow-up 11/20/2020 by Dr. Johney Frame noting increased strength since being home from the hospital.  He wore a 3-day monitor which was suggestive of atrial fibrillation.  Anticoagulation was deferred given recent admission, weakness, poor anticoagulation candidate.  He underwent subsequent nuclear stress test which was low risk study with no evidence of ischemia.  He presents today for follow-up.Reports no chest pain, pressure, tightness.  Tells me he gets dyspneic only with more than usual activity. He will use his as needed inhaler. No orthopnea, PND.  No palpitations, lightheadedness, dizziness, near-syncope, syncope.  He makes his own meals and does endorse adding a moderate amount of salt. Tells me his energy level after his most recent admission has started to come back. His strength is not quite back. No formal exercise routine. No falls over last 6 months EKGs/Labs/Other Studies Reviewed:   The following studies were reviewed today:  Lexiscan 12/19/20 Nuclear stress EF: 47%. The left ventricular ejection fraction is mildly decreased (45-54%). There was no ST segment deviation noted during stress. No T wave inversion was noted during stress. The study is normal. This is a low risk study.   1.  Reduced apical counts with normal wall motion consistent with apical thinning artifact.  No evidence of ischemia or infarction. 2.  Normal LVEF for this modality, 47%. 3.  This is a low risk study.   Monitor 11/29/20 Patch wear time was 2 days and 4 hours Monitor shows probable Afib  with some possible episodes of non-conducted PACs Monitor also comments on 27 episodes of VT, however, review of the strips suggests possible accelerated idoventricular rhythm as rate is on the lower end for VT No significant pauses.     Patch Wear Time:  2 days and 4 hours (2022-06-08T15:16:41-0400 to 2022-06-10T19:18:04-399)   27 Ventricular Tachycardia runs occurred, the run with the fastest interval lasting 17.7 secs with a max rate of 174 bpm, the longest lasting 3 mins 15 secs with an avg rate of 108 bpm. Atrial Fibrillation occurred continuously (100% burden), ranging  from 37-160 bpm (avg of 81 bpm). Idioventricular Rhythm was present. Isolated VEs were rare (<1.0%, 1986), VE Couplets were rare (<1.0%, 256), and VE Triplets were rare (<1.0%, 79). Ventricular Trigeminy was present.     Cath 04/2003 #1 RESULTS:  1. Left main coronary artery:   The left main coronary was free of     significant disease.  2. Left anterior descending artery:  The left anterior descending gave rise     to three septal perforators and three diagonal branches.  This vessel was     irregular, but there was no major obstruction.  3. Left circumflex artery:  The left circumflex gave rise to a small     intermediate branch and two large posterolateral branches.  There was 80%     narrowing in the mid-circumflex artery.  There was 50% narrowing in the     distal circumflex artery before the posterolateral branches.  4. Right coronary artery:  The right coronary artery had an anterior and     inferior-directed takeoff and had several sharp bends in its proximal     portion with diffuse disease with focal narrowing of 90%.  There were a     conus and atrial branch that arose from the lesions.  The right coronary     artery then gave rise to a right ventricular branch, a posterior     descending branch, and two small posterolateral branches.  The distal     vessel was irregular but free of major obstruction.  5. Left ventriculogram:  The left ventriculogram performed in RAO projection     showed mild inferior wall hypokinesis.  The estimated ejection fraction     was 60%.  The aortic pressure was 148/17, left ventricular pressure was     148/80.  6. Following stenting of the lesion in the proximal right coronary artery,     the stenosis improved from 90% to less than 10%.    CONCLUSION:  1. Coronary artery disease, status post recent non-ST segment elevation     myocardial infarction with irregularities in the left anterior descending     artery, 80% mid- and 50% distal stenosis in the circumflex artery, 90%     stenosis in the proximal right coronary artery, with mild inferior wall     hypokinesis and an ejection fraction estimated at 60%.  2. Successful stenting of the proximal right coronary artery with a bare     metal stent with improvement in  sentinel narrowing from 90% to less than     10%.    DISPOSITION:  The patient was returned to the post-angioplasty unit for  further observation.  Will plan to intervene on the circumflex artery in two  days on Wednesday.  Eustace Quail, M.D.   Cath 04/2003 #2   CARDIAC CATHETERIZATION    INDICATIONS FOR PROCEDURE:  Mr. Henneman is 75 years old and was admitted to  the hospital with chest pain and enzymes consistent with a non-ST elevation  myocardial infarction.  He underwent a difficult intervention two days ago  of a tight lesion in the right coronary artery with a bare metal stent and  was brought back today for intervention on the circumflex artery.    PROCEDURE:  The procedure was performed via the left femoral artery using  arterial sheath and a CLS-4 6 [place] guiding catheter with side holes.  We  used a PT2 wire light support.  We crossed the lesion with the wire without  difficulty.  We predilated with a 3.0 x 20-mm Maverick performing one  inflation up to 6 atmospheres for 30 seconds.  We then passed a 3.0 x 24-mm  Taxus stent down to the lesion.  We had some difficulty advancing the stent  down to the lesion because of marked tortuosity in the proximal vessel.  However, we were able to accomplish this with less difficulty than  anticipated.  We deployed the stent with one inflation of  12 atmospheres  for 30 seconds.  We then post dilated with a 3.25 x 20-mm Quantum Maverick  performing one inflation up to 15 atmospheres for 30 seconds.  Repeat  diagnostic study was then performed through the guiding catheter.  The  patient tolerated the procedure well and left the laboratory in satisfactory  condition.    RESULTS:  Initially, the stenosis in the mid circumflex artery was estimated  at 80%.  Following stenting, this improved to less than 10%.    CONCLUSIONS:  Successful stenting of the lesion in the mid circumflex  artery  with a Taxus stent with improvement in stent renarrowing from 80% to less  than 10%.    DISPOSITION:  The patient returned to the postangioplasty unit for further  observation.    He will need Plavix for six months and aspirin indefinitely.    TTE 11/06/20  1. Left ventricular ejection fraction, by estimation, is 55 to 60%. The  left ventricle has normal function. Left ventricular endocardial border  not optimally defined to evaluate regional wall motion. There is mild left  ventricular hypertrophy. Left  ventricular diastolic parameters are consistent with Grade I diastolic  dysfunction (impaired relaxation).   2. Right ventricular systolic function was not well visualized. The right  ventricular size is not well visualized. There is normal pulmonary artery  systolic pressure. The estimated right ventricular systolic pressure is  02.4 mmHg.   3. The mitral valve is normal in structure. No evidence of mitral valve  regurgitation. No evidence of mitral stenosis.   4. The aortic valve is tricuspid. Aortic valve regurgitation is not  visualized. No aortic stenosis is present.   5. The inferior vena cava is normal in size with greater than 50%  respiratory variability, suggesting right atrial pressure of 3 mmHg.   6. Technically difficult study with poor acoustic windows.    08/30/20: Summary:  Abdominal Aorta: There is evidence of abnormal dilatation of the mid  Abdominal aorta. The largest aortic diameter remains essentially unchanged  compared to prior exam. Previous diameter measurement was 5.2 cm obtained  on 02/14/2020.  Stenosis:  Mural thrombus seen in mid AAA sac. Common iliac arteries are ectatic  bilaterally.    08/30/20: Summary:  Right: Resting right ankle-brachial index is  within normal range. No  evidence of significant right lower extremity arterial disease. The right  toe-brachial index is abnormal. RT great toe pressure = 68 mmHg.   Left: Resting left  ankle-brachial index is within normal range. No  evidence of significant left lower extremity arterial disease. The left  toe-brachial index is abnormal. LT Great toe pressure = 83 mmHg.   EKG:  EKG is  ordered today.  The ekg ordered today demonstrates SR 83 bpm with frequent PVC and PACs.  Recent Labs: 11/07/2020: TSH 0.986 11/13/2020: ALT 41; BUN 8; Creatinine, Ser 1.28; Hemoglobin 9.1; Magnesium 2.0; Platelets 135; Potassium 4.1; Sodium 138  Recent Lipid Panel    Component Value Date/Time   CHOL 74 11/11/2020 1555   CHOL 112 07/13/2018 1002   TRIG 54 11/11/2020 1555   HDL 20 (L) 11/11/2020 1555   HDL 51 07/13/2018 1002   CHOLHDL 3.7 11/11/2020 1555   VLDL 11 11/11/2020 1555   LDLCALC 43 11/11/2020 1555   LDLCALC 51 07/13/2018 1002   Home Medications   Current Meds  Medication Sig   albuterol (VENTOLIN HFA) 108 (90 Base) MCG/ACT inhaler INHALE 1-2 PUFFS BY MOUTH EVERY 6 HOURS AS NEEDED FOR WHEEZE OR SHORTNESS OF BREATH   allopurinol (ZYLOPRIM) 100 MG tablet Take 1 tablet (100 mg total) by mouth at bedtime.   aspirin EC 81 MG tablet Take 1 tablet (81 mg total) by mouth daily. Swallow whole.   HYDROcodone-acetaminophen (NORCO) 7.5-325 MG tablet Take 1 tablet by mouth 2 (two) times daily as needed for moderate pain.   Multiple Vitamin (MULTIVITAMIN WITH MINERALS) TABS tablet Take 1 tablet by mouth daily.   rosuvastatin (CRESTOR) 20 MG tablet Take 1 tablet (20 mg total) by mouth daily.   tamsulosin (FLOMAX) 0.4 MG CAPS capsule Take 2 capsules (0.8 mg total) by mouth every evening.   ZENPEP 20000-63000 units CPEP TAKE 3 CAPSULES BY MOUTH 3 (THREE) TIMES DAILY BEFORE MEALS.    Review of Systems      All other systems reviewed and are otherwise negative except as noted above.  Physical Exam    VS:  BP 120/80 (BP Location: Left Arm, Patient Position: Sitting, Cuff Size: Normal)   Pulse 86   Ht 6\' 1"  (1.854 m)   Wt 136 lb (61.7 kg)   SpO2 95%   BMI 17.94 kg/m  , BMI Body mass  index is 17.94 kg/m.  Wt Readings from Last 3 Encounters:  02/22/21 136 lb (61.7 kg)  12/19/20 141 lb (64 kg)  11/27/20 141 lb 8 oz (64.2 kg)    GEN: Thin, well developed, in no acute distress. HEENT: normal. Neck: Supple, no JVD, carotid bruits, or masses. Cardiac: IRIR, no murmurs, rubs, or gallops. No clubbing, cyanosis, edema.  Radials/PT 2+ and equal bilaterally.  Respiratory:  Respirations regular and unlabored, clear to auscultation bilaterally. GI: Soft, nontender, nondistended. MS: No deformity or atrophy. Skin: Warm and dry, no rash. Neuro:  Strength and sensation are intact. Psych: Normal affect.  Assessment & Plan    Sinus bradycardia Joellyn Rued /VT / ?PAF -rate of cardia resolved with discontinuation of beta-blocker.  Still with frequent PAC and PVC by EKG today.  Question of atrial fibrillation on monitor is a poor anticoagulation candidate given portal vein thrombosis, possible cirrhosis, concern for bleed.  Will refer to EP for management of PACs and PVCs as AV nodal blocking agents difficult given previous bradycardia.  CAD s/p CABG - Stable with no anginal symptoms. No indication for  ischemic evaluation.  GDMT includes aspirin, rosuvastatin.  No beta-blocker due to previous bradycardia.  PAD - continue to follow with vascular surgery. No worsening claudication symptoms.   Nonobstructive portal vein thrombosis - not on AC due to high risk of bleed.  Has not had recent follow-up with GI.   Disposition: Refer to EP. Follow up in 4 month(s) with Dr. Johney Frame or APP.  Signed, Loel Dubonnet, NP 02/22/2021, 2:35 PM [place]

## 2021-02-22 NOTE — Patient Instructions (Signed)
Medication Instructions:  Continue your current medications.   *If you need a refill on your cardiac medications before your next appointment, please call your pharmacy*   Lab Work: None ordered today.   Testing/Procedures: Your EKG today showed irregular rhythm with multiple early beats. We will have you see one of our electrophysiologists (a heart doctor who specializes in heart rhythms) to see if there is anything we should do differently for management.    Follow-Up: At [facility], you and your health needs are our priority.  As part of our continuing mission to provide you with exceptional heart care, we have created designated Provider Care Teams.  These Care Teams include your primary Cardiologist (physician) and Advanced Practice Providers (APPs -  Physician Assistants and Nurse Practitioners) who all work together to provide you with the care you need, when you need it.  We recommend signing up for the patient portal called "MyChart".  Sign up information is provided on this After Visit Summary.  MyChart is used to connect with patients for Virtual Visits (Telemedicine).  Patients are able to view lab/test results, encounter notes, upcoming appointments, etc.  Non-urgent messages can be sent to your provider as well.   To learn more about what you can do with MyChart, go to [email or web address].    Your next appointment:   3-4 month(s)  The format for your next appointment:   In Person  Provider:   You may see Freada Bergeron, MD or one of the following Advanced Practice Providers on your designated Care Team:   Richardson Dopp, PA-C [place], [place] Loel Dubonnet, NP    Other Instructions  Recommend calling Dr. Nicole Cella office to schedule your 6 month follow up. Their phone number is  [phone].  Heart Healthy Diet Recommendations: A low-salt diet is recommended. Meats should be grilled, baked, or boiled. Avoid fried foods. Focus on lean protein sources  like fish or chicken with vegetables and fruits. The American Heart Association is a Microbiologist!    Exercise recommendations: The American Heart Association recommends 150 minutes of moderate intensity exercise weekly. Try 30 minutes of moderate intensity exercise 4-5 times per week. This could include walking, jogging, or swimming.

## 2021-02-26 ENCOUNTER — Encounter: Payer: Medicare HMO | Admitting: Student in an Organized Health Care Education/Training Program

## 2021-02-28 DIAGNOSIS — R3914 Feeling of incomplete bladder emptying: Secondary | ICD-10-CM | POA: Diagnosis not present

## 2021-02-28 DIAGNOSIS — R350 Frequency of micturition: Secondary | ICD-10-CM | POA: Diagnosis not present

## 2021-03-05 ENCOUNTER — Ambulatory Visit (INDEPENDENT_AMBULATORY_CARE_PROVIDER_SITE_OTHER): Payer: Medicare HMO | Admitting: Student in an Organized Health Care Education/Training Program

## 2021-03-05 ENCOUNTER — Encounter: Payer: Self-pay | Admitting: Student in an Organized Health Care Education/Training Program

## 2021-03-05 VITALS — BP 143/80 | HR 52 | Temp 97.7°F | Ht 73.0 in | Wt 133.6 lb

## 2021-03-05 DIAGNOSIS — Z79891 Long term (current) use of opiate analgesic: Secondary | ICD-10-CM | POA: Diagnosis not present

## 2021-03-05 DIAGNOSIS — R339 Retention of urine, unspecified: Secondary | ICD-10-CM

## 2021-03-05 DIAGNOSIS — Z23 Encounter for immunization: Secondary | ICD-10-CM | POA: Diagnosis not present

## 2021-03-05 DIAGNOSIS — E43 Unspecified severe protein-calorie malnutrition: Secondary | ICD-10-CM

## 2021-03-05 DIAGNOSIS — M25551 Pain in right hip: Secondary | ICD-10-CM

## 2021-03-05 DIAGNOSIS — I714 Abdominal aortic aneurysm, without rupture, unspecified: Secondary | ICD-10-CM

## 2021-03-05 DIAGNOSIS — I480 Paroxysmal atrial fibrillation: Secondary | ICD-10-CM | POA: Diagnosis not present

## 2021-03-05 DIAGNOSIS — K8689 Other specified diseases of pancreas: Secondary | ICD-10-CM | POA: Diagnosis not present

## 2021-03-05 DIAGNOSIS — M1A0610 Idiopathic chronic gout, right knee, without tophus (tophi): Secondary | ICD-10-CM

## 2021-03-05 DIAGNOSIS — I4891 Unspecified atrial fibrillation: Secondary | ICD-10-CM | POA: Insufficient documentation

## 2021-03-05 DIAGNOSIS — N3001 Acute cystitis with hematuria: Secondary | ICD-10-CM

## 2021-03-05 DIAGNOSIS — M25552 Pain in left hip: Secondary | ICD-10-CM

## 2021-03-05 MED ORDER — RIVAROXABAN 15 MG PO TABS: 15.0000 mg | ORAL_TABLET | Freq: Every day | ORAL | 0 refills | Status: DC

## 2021-03-05 MED ORDER — HYDROCODONE-ACETAMINOPHEN 7.5-325 MG PO TABS: 1.0000 | ORAL_TABLET | Freq: Two times a day (BID) | ORAL | 0 refills | Status: DC | PRN

## 2021-03-05 NOTE — Assessment & Plan Note (Signed)
New diagnosis based on ambulatory heart monitor with cardiology in June.  Also has frequent premature atrial contractions and episodes of nonsustained VT.  No active ischemia on subsequent myocardial perfusion scan.  No significant structural or valvular disease on echocardiogram in May.  Patient is at high risk for stroke with a CHA2DS2-VASc score of 4.  I reviewed cardiology documentation, patient does have some risk factors for bleeding on anticoagulation including frailty and possible malignancy.  He did have recent upper and lower GI endoscopies which were reassuring.  No active bleeding now, and despite lots of imaging have not found active malignancy.  I do not see an absolute contraindication to anticoagulation, given the patient's good functional status up until the last few months, I would like to try anticoagulation and see how he tolerates.  At this point a cerebral infarction would be devastating given his other comorbidities.  We will start Xarelto 15 mg daily which is adjusted for his slightly reduced renal function.  We will discontinue the aspirin to lower his bleeding risk, given the normal stress evaluation it likely is not adding much at this point.

## 2021-03-05 NOTE — Assessment & Plan Note (Signed)
Chronic urinary retention with several attempts to remove the Foley catheter resulting in recurrent retention.  He had an Enterococcus cystitis, is finished about 6 days of antibiotics but still having dysuria.  Urine color in the bag today is brown-green.  I am going to send another urine culture to see if he still has Enterococcus.  He has follow-up planned with [facility] to consider surgery to treat the urinary retention.  Patient has multiple things going on right now, but this is the issue causing him the most discomfort.  We will continue with Flomax 0.8 mg daily.

## 2021-03-05 NOTE — Patient Instructions (Signed)
Stop taking aspirin at this time Start taking xarelto 15mg  once a day Stop taking allopurinol Follow up with the urology physicians regarding your catheter Let me know if you have any issues with bleeding

## 2021-03-05 NOTE — Assessment & Plan Note (Signed)
Patient with inconsistent imaging, with CT showing a possible pancreatic mass, but no mass seen on MRI.  Plan over the summer it was for him to follow-up with GI for EUS with biopsy of the pancreatic head, however due to confusion he missed his appointment with Dr. [name] in August.  We are going to try to help him to reschedule this appointment.  Still having unintentional weight loss despite empiric trial of pancreatic enzymes.

## 2021-03-05 NOTE — Assessment & Plan Note (Signed)
Previous history of gout, but no flares since 2019.  He has had some significant recent weight loss due to other issues.  Last uric acid very low at 4.  Given his other comorbidities, including CKD and mild thrombocytopenia, I will discontinue the allopurinol for now.  We can always restart in the future if he develops more flares of gout.

## 2021-03-05 NOTE — Progress Notes (Signed)
Assessment and Plan:  See Encounters tab for problem-based medical decision making.   __________________________________________________________  HPI:   75 year old person living with ischemic heart disease, abdominal aortic aneurysm, osteoarthritis, here for follow-up of malnutrition and unintentional weight loss.  Patient has been doing very poorly since his admission to the hospital in May.  He has multiple issues going on at once, the most bothersome to him is the urinary retention for which she has a chronic urethral Foley catheter with no impact.  He reports dysuria, on antibiotics right now for Enterococcus faecalis cystitis without much improvement.  He has had about 40 pound unintentional weight loss over the last 1 year, currently is 133 pounds with a BMI of 17, in the setting of possible pancreatic mass on CT and history of a stage I lung cancer treated surgically many years ago.  Denies any hemoptysis or respiratory symptoms, reports good food intake, denies any abdominal pain with eating.  Reports good access to food.  He is needing more help with activities of daily living, sister helps him go to the grocery store, still gets around okay by himself.  No falls at home.  He has abdominal aortic aneurysm being monitored with vascular surgery, good adherence with his medications, denies any abdominal pain or chest pain.  He is established with a cardiology group, ischemic heart disease is stable with recent low risk stress test, had an ambulatory monitor for her regular heart rate with episodes of bradycardia, this showed premature atrial contractions, paroxysmal atrial fibrillation, and episodes of nonsustained VT.  He denies any syncope.  __________________________________________________________  Problem List: Patient Active Problem List   Diagnosis Date Noted   Atrial fibrillation ([name]) 03/05/2021    Priority: High   Pancreatic mass 11/08/2020    Priority: High   Protein-calorie  malnutrition, severe 11/07/2020    Priority: High   Chronic use of opiate for therapeutic purpose 05/01/2015    Priority: High   CAD (coronary artery disease) 03/17/2012    Priority: High   Tobacco use disorder 04/18/2006    Priority: High   Urinary retention 04/02/2018    Priority: Medium   Mild alcohol use disorder 12/09/2016    Priority: Medium   Atherosclerosis of aorta ([place]) 01/13/2015    Priority: Medium   Prediabetes 12/30/2014    Priority: Medium   AAA (abdominal aortic aneurysm) ([place])     Priority: Medium   CKD (chronic kidney disease) stage 2, GFR 60-89 ml/min 08/07/2007    Priority: Medium   Essential hypertension 04/18/2006    Priority: Medium   GERD 04/18/2006    Priority: Medium   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[place]) 11/08/2020    Priority: Low   Premature atrial contractions 07/31/2015    Priority: [place] maintenance 06/30/2012    Priority: Low   Erectile dysfunction 10/12/2010    Priority: Low   Arthropathy of pelvic region and thigh 05/02/2008    Priority: Low   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 CV: Irregularly, no murmurs Pulm: Normal effort, CTA throughout, no wheezing Abd: Soft, scaphoid, roughly 5 cm pulsating mass felt around the umbilicus, no tenderness to gentle palpation Ext: Warm, no edema, normal joints

## 2021-03-05 NOTE — Assessment & Plan Note (Addendum)
5.2 cm abdominal aortic aneurysm that is slowly growing in size over the last few years.  This is being managed by vascular surgery, no symptoms of claudication, abdominal pain, or mesenteric ischemia.  Blood pressure and cholesterol are under good control.  We are going to start anticoagulation for atrial fibrillation with Xarelto at this time, there is no evidence that chronic anticoagulation alters the natural history of AAA.  We will stop aspirin to lower bleeding risk, it is also unclear if the use of aspirin changes the clinical progression of AAA.

## 2021-03-06 ENCOUNTER — Other Ambulatory Visit: Payer: Self-pay

## 2021-03-06 ENCOUNTER — Emergency Department (HOSPITAL_COMMUNITY): Payer: Medicare HMO | Admitting: Anesthesiology

## 2021-03-06 ENCOUNTER — Encounter (HOSPITAL_COMMUNITY): Payer: Self-pay | Admitting: Oncology

## 2021-03-06 ENCOUNTER — Encounter: Payer: Self-pay | Admitting: Student in an Organized Health Care Education/Training Program

## 2021-03-06 ENCOUNTER — Emergency Department (HOSPITAL_COMMUNITY): Payer: Medicare HMO

## 2021-03-06 ENCOUNTER — Inpatient Hospital Stay (HOSPITAL_COMMUNITY)
Admission: EM | Admit: 2021-03-06 | Discharge: 2021-03-11 | DRG: 377 | Disposition: A | Discharge status: Home / Self Care | Payer: Medicare HMO | Attending: Internal Medicine | Admitting: Internal Medicine

## 2021-03-06 ENCOUNTER — Encounter (HOSPITAL_COMMUNITY)
Admission: EM | Disposition: A | Discharge status: Home / Self Care | Payer: Self-pay | Source: Home / Self Care | Attending: Internal Medicine

## 2021-03-06 DIAGNOSIS — R319 Hematuria, unspecified: Secondary | ICD-10-CM | POA: Diagnosis not present

## 2021-03-06 DIAGNOSIS — Z7982 Long term (current) use of aspirin: Secondary | ICD-10-CM

## 2021-03-06 DIAGNOSIS — I480 Paroxysmal atrial fibrillation: Secondary | ICD-10-CM | POA: Diagnosis not present

## 2021-03-06 DIAGNOSIS — N400 Enlarged prostate without lower urinary tract symptoms: Secondary | ICD-10-CM | POA: Diagnosis present

## 2021-03-06 DIAGNOSIS — K449 Diaphragmatic hernia without obstruction or gangrene: Secondary | ICD-10-CM | POA: Diagnosis present

## 2021-03-06 DIAGNOSIS — R58 Hemorrhage, not elsewhere classified: Secondary | ICD-10-CM | POA: Diagnosis not present

## 2021-03-06 DIAGNOSIS — I493 Ventricular premature depolarization: Secondary | ICD-10-CM | POA: Diagnosis not present

## 2021-03-06 DIAGNOSIS — I252 Old myocardial infarction: Secondary | ICD-10-CM

## 2021-03-06 DIAGNOSIS — E43 Unspecified severe protein-calorie malnutrition: Secondary | ICD-10-CM | POA: Diagnosis not present

## 2021-03-06 DIAGNOSIS — K922 Gastrointestinal hemorrhage, unspecified: Secondary | ICD-10-CM | POA: Diagnosis present

## 2021-03-06 DIAGNOSIS — E785 Hyperlipidemia, unspecified: Secondary | ICD-10-CM | POA: Diagnosis present

## 2021-03-06 DIAGNOSIS — K861 Other chronic pancreatitis: Secondary | ICD-10-CM | POA: Diagnosis present

## 2021-03-06 DIAGNOSIS — I4891 Unspecified atrial fibrillation: Secondary | ICD-10-CM | POA: Diagnosis present

## 2021-03-06 DIAGNOSIS — F10239 Alcohol dependence with withdrawal, unspecified: Secondary | ICD-10-CM | POA: Diagnosis not present

## 2021-03-06 DIAGNOSIS — K3182 Dieulafoy lesion (hemorrhagic) of stomach and duodenum: Secondary | ICD-10-CM | POA: Diagnosis not present

## 2021-03-06 DIAGNOSIS — K5730 Diverticulosis of large intestine without perforation or abscess without bleeding: Secondary | ICD-10-CM | POA: Diagnosis present

## 2021-03-06 DIAGNOSIS — Z8249 Family history of ischemic heart disease and other diseases of the circulatory system: Secondary | ICD-10-CM

## 2021-03-06 DIAGNOSIS — R571 Hypovolemic shock: Secondary | ICD-10-CM | POA: Diagnosis not present

## 2021-03-06 DIAGNOSIS — Z955 Presence of coronary angioplasty implant and graft: Secondary | ICD-10-CM

## 2021-03-06 DIAGNOSIS — N390 Urinary tract infection, site not specified: Secondary | ICD-10-CM | POA: Diagnosis present

## 2021-03-06 DIAGNOSIS — K219 Gastro-esophageal reflux disease without esophagitis: Secondary | ICD-10-CM | POA: Diagnosis present

## 2021-03-06 DIAGNOSIS — D62 Acute posthemorrhagic anemia: Secondary | ICD-10-CM | POA: Diagnosis present

## 2021-03-06 DIAGNOSIS — R64 Cachexia: Secondary | ICD-10-CM | POA: Diagnosis present

## 2021-03-06 DIAGNOSIS — Z888 Allergy status to other drugs, medicaments and biological substances status: Secondary | ICD-10-CM

## 2021-03-06 DIAGNOSIS — Z833 Family history of diabetes mellitus: Secondary | ICD-10-CM

## 2021-03-06 DIAGNOSIS — F17210 Nicotine dependence, cigarettes, uncomplicated: Secondary | ICD-10-CM | POA: Diagnosis present

## 2021-03-06 DIAGNOSIS — D631 Anemia in chronic kidney disease: Secondary | ICD-10-CM | POA: Diagnosis not present

## 2021-03-06 DIAGNOSIS — K8020 Calculus of gallbladder without cholecystitis without obstruction: Secondary | ICD-10-CM | POA: Diagnosis not present

## 2021-03-06 DIAGNOSIS — D649 Anemia, unspecified: Secondary | ICD-10-CM | POA: Diagnosis not present

## 2021-03-06 DIAGNOSIS — R001 Bradycardia, unspecified: Secondary | ICD-10-CM | POA: Diagnosis not present

## 2021-03-06 DIAGNOSIS — K254 Chronic or unspecified gastric ulcer with hemorrhage: Secondary | ICD-10-CM | POA: Diagnosis not present

## 2021-03-06 DIAGNOSIS — Z7901 Long term (current) use of anticoagulants: Secondary | ICD-10-CM

## 2021-03-06 DIAGNOSIS — R000 Tachycardia, unspecified: Secondary | ICD-10-CM | POA: Diagnosis not present

## 2021-03-06 DIAGNOSIS — Z91013 Allergy to seafood: Secondary | ICD-10-CM

## 2021-03-06 DIAGNOSIS — N182 Chronic kidney disease, stage 2 (mild): Secondary | ICD-10-CM | POA: Diagnosis present

## 2021-03-06 DIAGNOSIS — Z681 Body mass index (BMI) 19 or less, adult: Secondary | ICD-10-CM | POA: Diagnosis not present

## 2021-03-06 DIAGNOSIS — I959 Hypotension, unspecified: Secondary | ICD-10-CM | POA: Diagnosis not present

## 2021-03-06 DIAGNOSIS — I2510 Atherosclerotic heart disease of native coronary artery without angina pectoris: Secondary | ICD-10-CM | POA: Diagnosis present

## 2021-03-06 DIAGNOSIS — Z8261 Family history of arthritis: Secondary | ICD-10-CM

## 2021-03-06 DIAGNOSIS — I700 Atherosclerosis of aorta: Secondary | ICD-10-CM | POA: Diagnosis not present

## 2021-03-06 DIAGNOSIS — K3189 Other diseases of stomach and duodenum: Secondary | ICD-10-CM | POA: Diagnosis not present

## 2021-03-06 DIAGNOSIS — Z20822 Contact with and (suspected) exposure to covid-19: Secondary | ICD-10-CM | POA: Diagnosis present

## 2021-03-06 DIAGNOSIS — R531 Weakness: Secondary | ICD-10-CM | POA: Diagnosis not present

## 2021-03-06 DIAGNOSIS — Z85118 Personal history of other malignant neoplasm of bronchus and lung: Secondary | ICD-10-CM

## 2021-03-06 DIAGNOSIS — B9620 Unspecified Escherichia coli [E. coli] as the cause of diseases classified elsewhere: Secondary | ICD-10-CM | POA: Diagnosis present

## 2021-03-06 DIAGNOSIS — M1990 Unspecified osteoarthritis, unspecified site: Secondary | ICD-10-CM | POA: Diagnosis present

## 2021-03-06 DIAGNOSIS — I714 Abdominal aortic aneurysm, without rupture: Secondary | ICD-10-CM | POA: Diagnosis present

## 2021-03-06 DIAGNOSIS — I129 Hypertensive chronic kidney disease with stage 1 through stage 4 chronic kidney disease, or unspecified chronic kidney disease: Secondary | ICD-10-CM | POA: Diagnosis not present

## 2021-03-06 DIAGNOSIS — Z79899 Other long term (current) drug therapy: Secondary | ICD-10-CM

## 2021-03-06 DIAGNOSIS — N281 Cyst of kidney, acquired: Secondary | ICD-10-CM | POA: Diagnosis not present

## 2021-03-06 HISTORY — PX: HEMOSTASIS CLIP PLACEMENT: SHX6857

## 2021-03-06 HISTORY — PX: ESOPHAGOGASTRODUODENOSCOPY (EGD) WITH PROPOFOL: SHX5813

## 2021-03-06 HISTORY — PX: HOT HEMOSTASIS: SHX5433

## 2021-03-06 LAB — CBC WITH DIFFERENTIAL/PLATELET
Abs Immature Granulocytes: 0.03 10*3/uL (ref 0.00–0.07)
Basophils Absolute: 0 10*3/uL (ref 0.0–0.1)
Basophils Relative: 0 %
Eosinophils Absolute: 0 10*3/uL (ref 0.0–0.5)
Eosinophils Relative: 0 %
HCT: 26.6 % — ABNORMAL LOW (ref 39.0–52.0)
Hemoglobin: 8.4 g/dL — ABNORMAL LOW (ref 13.0–17.0)
Immature Granulocytes: 0 %
Lymphocytes Relative: 9 %
Lymphs Abs: 0.7 10*3/uL (ref 0.7–4.0)
MCH: 31.3 pg (ref 26.0–34.0)
MCHC: 31.6 g/dL (ref 30.0–36.0)
MCV: 99.3 fL (ref 80.0–100.0)
Monocytes Absolute: 0.3 10*3/uL (ref 0.1–1.0)
Monocytes Relative: 4 %
Neutro Abs: 6.5 10*3/uL (ref 1.7–7.7)
Neutrophils Relative %: 87 %
Platelets: 112 10*3/uL — ABNORMAL LOW (ref 150–400)
RBC: 2.68 MIL/uL — ABNORMAL LOW (ref 4.22–5.81)
RDW: 15.1 % (ref 11.5–15.5)
WBC: 7.5 10*3/uL (ref 4.0–10.5)
nRBC: 0 % (ref 0.0–0.2)

## 2021-03-06 LAB — COMPREHENSIVE METABOLIC PANEL
ALT: 13 U/L (ref 0–44)
AST: 30 U/L (ref 15–41)
Albumin: 2.9 g/dL — ABNORMAL LOW (ref 3.5–5.0)
Alkaline Phosphatase: 52 U/L (ref 38–126)
Anion gap: 9 (ref 5–15)
BUN: 52 mg/dL — ABNORMAL HIGH (ref 8–23)
CO2: 25 mmol/L (ref 22–32)
Calcium: 9.3 mg/dL (ref 8.9–10.3)
Chloride: 111 mmol/L (ref 98–111)
Creatinine, Ser: 1.42 mg/dL — ABNORMAL HIGH (ref 0.61–1.24)
GFR, Estimated: 52 mL/min — ABNORMAL LOW (ref >60)
Glucose, Bld: 110 mg/dL — ABNORMAL HIGH (ref 70–99)
Potassium: 5.1 mmol/L (ref 3.5–5.1)
Sodium: 145 mmol/L (ref 135–145)
Total Bilirubin: 0.5 mg/dL (ref 0.3–1.2)
Total Protein: 6.1 g/dL — ABNORMAL LOW (ref 6.5–8.1)

## 2021-03-06 LAB — RESP PANEL BY RT-PCR (FLU A&B, COVID) ARPGX2
Influenza A by PCR: NEGATIVE
Influenza B by PCR: NEGATIVE
SARS Coronavirus 2 by RT PCR: NEGATIVE

## 2021-03-06 LAB — URINALYSIS, COMPLETE
Bilirubin, UA: NEGATIVE
Glucose, UA: NEGATIVE
Nitrite, UA: NEGATIVE
Specific Gravity, UA: 1.015 (ref 1.005–1.030)
Urobilinogen, Ur: 1 mg/dL (ref 0.2–1.0)
pH, UA: 8.5 — ABNORMAL HIGH (ref 5.0–7.5)

## 2021-03-06 LAB — CMP14 + ANION GAP
ALT: 14 IU/L (ref 0–44)
AST: 30 IU/L (ref 0–40)
Albumin/Globulin Ratio: 1 — ABNORMAL LOW (ref 1.2–2.2)
Albumin: 3.5 g/dL — ABNORMAL LOW (ref 3.7–4.7)
Alkaline Phosphatase: 80 IU/L (ref 44–121)
Anion Gap: 13 mmol/L (ref 10.0–18.0)
BUN/Creatinine Ratio: 16 (ref 10–24)
BUN: 19 mg/dL (ref 8–27)
Bilirubin Total: 0.3 mg/dL (ref 0.0–1.2)
CO2: 25 mmol/L (ref 20–29)
Calcium: 10 mg/dL (ref 8.6–10.2)
Chloride: 102 mmol/L (ref 96–106)
Creatinine, Ser: 1.18 mg/dL (ref 0.76–1.27)
Globulin, Total: 3.5 g/dL (ref 1.5–4.5)
Glucose: 98 mg/dL (ref 65–99)
Potassium: 4.3 mmol/L (ref 3.5–5.2)
Sodium: 140 mmol/L (ref 134–144)
Total Protein: 7 g/dL (ref 6.0–8.5)
eGFR: 64 mL/min/{1.73_m2} (ref >59)

## 2021-03-06 LAB — CBC
Hematocrit: 35.8 % — ABNORMAL LOW (ref 37.5–51.0)
Hemoglobin: 11.9 g/dL — ABNORMAL LOW (ref 13.0–17.7)
MCH: 30.9 pg (ref 26.6–33.0)
MCHC: 33.2 g/dL (ref 31.5–35.7)
MCV: 93 fL (ref 79–97)
Platelets: 131 10*3/uL — ABNORMAL LOW (ref 150–450)
RBC: 3.85 x10E6/uL — ABNORMAL LOW (ref 4.14–5.80)
RDW: 13.7 % (ref 11.6–15.4)
WBC: 4.8 10*3/uL (ref 3.4–10.8)

## 2021-03-06 LAB — MAGNESIUM: Magnesium: 2 mg/dL (ref 1.6–2.3)

## 2021-03-06 LAB — MICROSCOPIC EXAMINATION
Casts: NONE SEEN /lpf
RBC, Urine: >30 /hpf — AB (ref 0–2)
WBC, UA: >30 /hpf — AB (ref 0–5)

## 2021-03-06 LAB — I-STAT CHEM 8, ED
BUN: 58 mg/dL — ABNORMAL HIGH (ref 8–23)
Calcium, Ion: 1.21 mmol/L (ref 1.15–1.40)
Chloride: 108 mmol/L (ref 98–111)
Creatinine, Ser: 1.3 mg/dL — ABNORMAL HIGH (ref 0.61–1.24)
Glucose, Bld: 106 mg/dL — ABNORMAL HIGH (ref 70–99)
HCT: 26 % — ABNORMAL LOW (ref 39.0–52.0)
Hemoglobin: 8.8 g/dL — ABNORMAL LOW (ref 13.0–17.0)
Potassium: 4.9 mmol/L (ref 3.5–5.1)
Sodium: 142 mmol/L (ref 135–145)
TCO2: 25 mmol/L (ref 22–32)

## 2021-03-06 LAB — HEMOGLOBIN AND HEMATOCRIT, BLOOD
HCT: 21.6 % — ABNORMAL LOW (ref 39.0–52.0)
Hemoglobin: 6.8 g/dL — CL (ref 13.0–17.0)

## 2021-03-06 LAB — MRSA NEXT GEN BY PCR, NASAL: MRSA by PCR Next Gen: NOT DETECTED

## 2021-03-06 LAB — PHOSPHORUS: Phosphorus: 3.2 mg/dL (ref 2.8–4.1)

## 2021-03-06 SURGERY — ESOPHAGOGASTRODUODENOSCOPY (EGD) WITH PROPOFOL
Anesthesia: Monitor Anesthesia Care

## 2021-03-06 MED ORDER — IOHEXOL 350 MG/ML SOLN
100.0000 mL | Freq: Once | INTRAVENOUS | Status: AC | PRN
  Administered 2021-03-06: 100 mL via INTRAVENOUS

## 2021-03-06 MED ORDER — FENTANYL CITRATE (PF) 100 MCG/2ML IJ SOLN
INTRAMUSCULAR | Status: AC
  Filled 2021-03-06: qty 2

## 2021-03-06 MED ORDER — PHENYLEPHRINE HCL (PRESSORS) 10 MG/ML IV SOLN
INTRAVENOUS | Status: AC
  Filled 2021-03-06: qty 1

## 2021-03-06 MED ORDER — HYDRALAZINE HCL 20 MG/ML IJ SOLN: 10.0000 mg | Freq: Four times a day (QID) | INTRAMUSCULAR | Status: DC | PRN

## 2021-03-06 MED ORDER — PROPOFOL 500 MG/50ML IV EMUL
INTRAVENOUS | Status: AC
  Filled 2021-03-06: qty 50

## 2021-03-06 MED ORDER — ALBUTEROL SULFATE (2.5 MG/3ML) 0.083% IN NEBU
2.5000 mg | INHALATION_SOLUTION | Freq: Four times a day (QID) | RESPIRATORY_TRACT | Status: DC
  Filled 2021-03-06: qty 3

## 2021-03-06 MED ORDER — ROSUVASTATIN CALCIUM 20 MG PO TABS
20.0000 mg | ORAL_TABLET | Freq: Every day | ORAL | Status: DC
  Administered 2021-03-06 – 2021-03-11 (×5): 20 mg via ORAL
  Filled 2021-03-06 (×6): qty 1

## 2021-03-06 MED ORDER — TAMSULOSIN HCL 0.4 MG PO CAPS
0.8000 mg | ORAL_CAPSULE | Freq: Every evening | ORAL | Status: DC
Start: 2021-03-06 — End: 2021-03-11
  Administered 2021-03-06 – 2021-03-10 (×5): 0.8 mg via ORAL
  Filled 2021-03-06 (×5): qty 2

## 2021-03-06 MED ORDER — CHLORHEXIDINE GLUCONATE CLOTH 2 % EX PADS
6.0000 | MEDICATED_PAD | Freq: Every day | CUTANEOUS | Status: DC
  Administered 2021-03-06 – 2021-03-10 (×5): 6 via TOPICAL

## 2021-03-06 MED ORDER — PANTOPRAZOLE SODIUM 40 MG IV SOLR
40.0000 mg | Freq: Once | INTRAVENOUS | Status: AC
  Administered 2021-03-06: 40 mg via INTRAVENOUS
  Filled 2021-03-06: qty 40

## 2021-03-06 MED ORDER — ONDANSETRON HCL 4 MG/2ML IJ SOLN
INTRAMUSCULAR | Status: DC | PRN
  Administered 2021-03-06: 4 mg via INTRAVENOUS

## 2021-03-06 MED ORDER — LIDOCAINE 2% (20 MG/ML) 5 ML SYRINGE
INTRAMUSCULAR | Status: DC | PRN
  Administered 2021-03-06: 40 mg via INTRAVENOUS

## 2021-03-06 MED ORDER — VASOPRESSIN 20 UNIT/ML IV SOLN
INTRAVENOUS | Status: AC
  Filled 2021-03-06: qty 1

## 2021-03-06 MED ORDER — VASOPRESSIN 20 UNIT/ML IV SOLN
INTRAVENOUS | Status: DC | PRN
  Administered 2021-03-06 (×3): 2 [IU] via INTRAVENOUS

## 2021-03-06 MED ORDER — SODIUM CHLORIDE 0.9 % IV BOLUS
1000.0000 mL | Freq: Once | INTRAVENOUS | Status: AC
  Administered 2021-03-06: 1000 mL via INTRAVENOUS

## 2021-03-06 MED ORDER — ROCURONIUM BROMIDE 10 MG/ML (PF) SYRINGE
PREFILLED_SYRINGE | INTRAVENOUS | Status: DC | PRN
  Administered 2021-03-06: 40 mg via INTRAVENOUS

## 2021-03-06 MED ORDER — SODIUM CHLORIDE 0.9 % IV SOLN: INTRAVENOUS | Status: DC

## 2021-03-06 MED ORDER — SODIUM CHLORIDE 0.9% IV SOLUTION: Freq: Once | INTRAVENOUS | Status: AC

## 2021-03-06 MED ORDER — PHENYLEPHRINE HCL-NACL 20-0.9 MG/250ML-% IV SOLN
INTRAVENOUS | Status: DC | PRN
  Administered 2021-03-06: 50 ug/min via INTRAVENOUS

## 2021-03-06 MED ORDER — MORPHINE SULFATE (PF) 2 MG/ML IV SOLN
1.0000 mg | INTRAVENOUS | Status: DC | PRN
Start: 2021-03-06 — End: 2021-03-10
  Administered 2021-03-08 (×2): 1 mg via INTRAVENOUS
  Filled 2021-03-06 (×2): qty 1

## 2021-03-06 MED ORDER — SUGAMMADEX SODIUM 200 MG/2ML IV SOLN
INTRAVENOUS | Status: DC | PRN
  Administered 2021-03-06: 120 mg via INTRAVENOUS

## 2021-03-06 MED ORDER — LACTATED RINGERS IV SOLN: INTRAVENOUS | Status: DC | PRN

## 2021-03-06 MED ORDER — ACETAMINOPHEN 325 MG PO TABS
650.0000 mg | ORAL_TABLET | Freq: Four times a day (QID) | ORAL | Status: DC | PRN
  Administered 2021-03-07: 650 mg via ORAL
  Filled 2021-03-06: qty 2

## 2021-03-06 MED ORDER — FENTANYL CITRATE (PF) 100 MCG/2ML IJ SOLN
INTRAMUSCULAR | Status: DC | PRN
  Administered 2021-03-06: 50 ug via INTRAVENOUS

## 2021-03-06 MED ORDER — LEVALBUTEROL HCL 0.63 MG/3ML IN NEBU: 0.6300 mg | INHALATION_SOLUTION | Freq: Four times a day (QID) | RESPIRATORY_TRACT | Status: DC | PRN

## 2021-03-06 MED ORDER — PHENYLEPHRINE 40 MCG/ML (10ML) SYRINGE FOR IV PUSH (FOR BLOOD PRESSURE SUPPORT)
PREFILLED_SYRINGE | INTRAVENOUS | Status: DC | PRN
  Administered 2021-03-06: 120 ug via INTRAVENOUS
  Administered 2021-03-06: 160 ug via INTRAVENOUS
  Administered 2021-03-06: 200 ug via INTRAVENOUS

## 2021-03-06 MED ORDER — ACETAMINOPHEN 650 MG RE SUPP: 650.0000 mg | Freq: Four times a day (QID) | RECTAL | Status: DC | PRN

## 2021-03-06 MED ORDER — SODIUM CHLORIDE 0.9% IV SOLUTION: Freq: Once | INTRAVENOUS | Status: DC

## 2021-03-06 MED ORDER — PANTOPRAZOLE INFUSION (NEW) - SIMPLE MED
8.0000 mg/h | INTRAVENOUS | Status: DC
  Administered 2021-03-06 – 2021-03-07 (×2): 8 mg/h via INTRAVENOUS
  Filled 2021-03-06 (×2): qty 100
  Filled 2021-03-06 (×2): qty 80

## 2021-03-06 MED ORDER — PROPOFOL 10 MG/ML IV BOLUS
INTRAVENOUS | Status: DC | PRN
  Administered 2021-03-06: 130 mg via INTRAVENOUS

## 2021-03-06 MED ORDER — PROPOFOL 10 MG/ML IV BOLUS
INTRAVENOUS | Status: AC
  Filled 2021-03-06: qty 20

## 2021-03-06 SURGICAL SUPPLY — 14 items

## 2021-03-06 NOTE — ED Provider Notes (Signed)
[facility] DEPT Provider Note   CSN: [id number] Arrival date & time: 03/06/21  1106     History Chief Complaint  Patient presents with   GI Bleeding    [name] is a 75 y.o. male.  75 yo M with a chief complaints of bloody stool and blood in his urinary catheter.  This was noticed this morning when he woke up.  He is also having some abdominal discomfort occurs usually when he coughs.  He was fine yesterday.  Saw his family doctor in the office to change some of his medicines.  Started on a new medicine for blood thinning that he does not totally understand.  Denies cough denies fever.  The history is provided by the patient and the EMS personnel.  Illness Severity:  Moderate Onset quality:  Gradual Duration:  4 hours Timing:  Constant Progression:  Worsening Chronicity:  New Associated symptoms: abdominal pain   Associated symptoms: no chest pain, no congestion, no diarrhea, no fever, no headaches, no myalgias, no rash, no shortness of breath and no vomiting       Past Medical History:  Diagnosis Date   AAA (abdominal aortic aneurysm) ([employer])    followed by vasc surgery   Alcohol abuse    Barretts esophagus    bx distal esophagus 2011 neg    Bladder outlet obstruction 04/02/2018   CAD in native artery    a. s/p stent to prox RCA and mid Cx.   Cataract    Cholelithiasis    Chronic osteomyelitis involving ankle and foot, right ([place]) 07/13/2018   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   2006 - 3 adenomas largest 12 mm  2008 - no polyps 2013-  2 mm cecal polyp, adenoma, routine repeat colonoscopy 02/2017 approx 05/14/2017 18 polyps max 12 mm ALL adenomas recall 2019 [name], MD, [facility]      Diverticulosis of colon    DJD (degenerative joint disease)    left hip   Full dentures    upper and lower   GERD (gastroesophageal reflux disease)    diet controlled    Gout    feet   Hiatal hernia    2 cm noted on upper endos 2011     Hx of adenomatous colonic polyps    Hyperlipidemia    Hypertension    Internal hemorrhoid    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[name]) 11/08/2020   Protein-calorie malnutrition, severe 11/07/2020   Urinary retention 04/02/2018   Mild alcohol use disorder 12/09/2016   Premature atrial contractions 07/31/2015   Chronic use of opiate for therapeutic purpose 05/01/2015   UTI (urinary tract infection) 03/02/2015   Atherosclerosis of aorta ([name]) 01/13/2015   Prediabetes 12/30/2014   AAA (abdominal aortic aneurysm) ([name])    Healthcare maintenance 06/30/2012  CAD (coronary artery disease) 03/17/2012   Erectile dysfunction 10/12/2010   Arthropathy of pelvic region and thigh 05/02/2008   CKD (chronic kidney disease) stage 2, GFR 60-89 ml/min 08/07/2007   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   Gout 04/18/2006   Tobacco use disorder 04/18/2006   Essential hypertension 04/18/2006   GERD 04/18/2006    Past Surgical History:  Procedure Laterality Date   BIOPSY  11/12/2020   Procedure: BIOPSY;  Surgeon: Doran Stabler, MD;  Location: [facility];  Service: Gastroenterology;;   cataract surgery Bilateral 2018   removal of cataracts   COLONOSCOPY     hx polyps - [name] 02/2012   COLONOSCOPY WITH PROPOFOL N/A 11/12/2020   Procedure: COLONOSCOPY WITH PROPOFOL;  Surgeon: Doran Stabler, MD;  Location: [facility] ENDOSCOPY;  Service:  Gastroenterology;  Laterality: N/A;   CORONARY ANGIOPLASTY WITH STENT PLACEMENT     ESOPHAGOGASTRODUODENOSCOPY (EGD) WITH PROPOFOL N/A 11/12/2020   Procedure: ESOPHAGOGASTRODUODENOSCOPY (EGD) WITH PROPOFOL;  Surgeon: Doran Stabler, MD;  Location: [facility];  Service: Gastroenterology;  Laterality: N/A;   flexible cystourethroscopy  05/24/06   INGUINAL HERNIA REPAIR  2005   LUNG LOBECTOMY Right 11/07   RUL   MULTIPLE TOOTH EXTRACTIONS     POLYPECTOMY  11/12/2020   Procedure: POLYPECTOMY;  Surgeon: Doran Stabler, MD;  Location: [facility] ENDOSCOPY;  Service: Gastroenterology;;   UPPER GASTROINTESTINAL ENDOSCOPY         Family History  Problem Relation Age of Onset   Diabetes Mother    Hypertension Sister    Heart disease Sister    Diabetes Sister    Arthritis Brother    Gout Brother    Colon cancer Neg Hx    Esophageal cancer Neg Hx    Rectal cancer Neg Hx    Stomach cancer Neg Hx    Colon polyps Neg Hx     Social History   Tobacco Use   Smoking status: Every Day    Packs/day: 0.25    Years: 55.00    Pack years: 13.75    Types: Cigarettes   Smokeless tobacco: Never  Vaping Use   Vaping Use: Never used  Substance Use Topics   Alcohol use: Yes    Alcohol/week: 0.0 standard drinks    Comment: drinking 1-2 pts gin/week, (mixed with tap water not tonic)   Drug use: No    Home Medications Prior to Admission medications   Medication Sig Start Date End Date Taking? Authorizing Provider  albuterol (VENTOLIN HFA) 108 (90 Base) MCG/ACT inhaler INHALE 1-2 PUFFS BY MOUTH EVERY 6 HOURS AS NEEDED FOR WHEEZE OR SHORTNESS OF BREATH 02/13/21   Axel Filler, MD  HYDROcodone-acetaminophen [facility]) 7.5-325 MG tablet Take 1 tablet by mouth 2 (two) times daily as needed for moderate pain. 03/05/21   Axel Filler, MD  Multiple Vitamin (MULTIVITAMIN WITH MINERALS) TABS tablet Take 1 tablet by mouth daily.    [provider]  Rivaroxaban (XARELTO) 15 MG TABS  tablet Take 1 tablet (15 mg total) by mouth daily with supper. 03/05/21   Axel Filler, MD  rosuvastatin (CRESTOR) 20 MG tablet Take 1 tablet (20 mg total) by mouth daily. 11/27/20   Axel Filler, MD  tamsulosin (FLOMAX) 0.4 MG CAPS capsule Take 2 capsules (0.8 mg total) by mouth every evening. 11/27/20   Axel Filler, MD  ZENPEP 20000-63000 units CPEP TAKE 3 CAPSULES BY MOUTH 3 (THREE) TIMES DAILY BEFORE MEALS. 02/13/21   Evette Doffing,  Mallie Mussel, MD    Allergies    Candesartan cilexetil-hctz, Hydrochlorothiazide, and Shellfish allergy  Review of Systems   Review of Systems  Constitutional:  Negative for chills and fever.  HENT:  Negative for congestion and facial swelling.   Eyes:  Negative for discharge and visual disturbance.  Respiratory:  Negative for shortness of breath.   Cardiovascular:  Negative for chest pain and palpitations.  Gastrointestinal:  Positive for abdominal pain and blood in stool. Negative for diarrhea and vomiting.  Musculoskeletal:  Negative for arthralgias and myalgias.  Skin:  Negative for color change and rash.  Neurological:  Negative for tremors, syncope and headaches.  Psychiatric/Behavioral:  Negative for confusion and dysphoric mood.    Physical Exam Updated Vital Signs BP (!) 91/55   Pulse (!) 108   Temp 97.8 F (36.6 C) (Axillary)   Resp 18   Ht 6\' 1"  (1.854 m)   Wt 59.4 kg   SpO2 100%   BMI 17.28 kg/m   Physical Exam Vitals and nursing note reviewed.  Constitutional:      Appearance: He is well-developed.     Comments: Cachectic  HENT:     Head: Normocephalic and atraumatic.  Eyes:     Pupils: Pupils are equal, round, and reactive to light.  Neck:     Vascular: No JVD.  Cardiovascular:     Rate and Rhythm: Regular rhythm. Tachycardia present.     Heart sounds: No murmur heard.   No friction rub. No gallop.  Pulmonary:     Effort: No respiratory distress.     Breath sounds: No wheezing.  Abdominal:      General: There is no distension.     Tenderness: There is no abdominal tenderness. There is no guarding or rebound.     Comments: Exquisitely tender to palpation about the abdomen.  Palpable pulsatile abdominal mass.  Genitourinary:    Comments: Gross blood in the urinary catheter. Musculoskeletal:        General: Normal range of motion.     Cervical back: Normal range of motion and neck supple.  Skin:    Coloration: Skin is not pale.     Findings: No rash.  Neurological:     Mental Status: He is alert and oriented to person, place, and time.  Psychiatric:        Behavior: Behavior normal.    ED Results / Procedures / Treatments   Labs (all labs ordered are listed, but only abnormal results are displayed) Labs Reviewed  CBC WITH DIFFERENTIAL/PLATELET - Abnormal; Notable for the following components:      Result Value   RBC 2.68 (*)    Hemoglobin 8.4 (*)    HCT 26.6 (*)    Platelets 112 (*)    All other components within normal limits  COMPREHENSIVE METABOLIC PANEL - Abnormal; Notable for the following components:   Glucose, Bld 110 (*)    BUN 52 (*)    Creatinine, Ser 1.42 (*)    Total Protein 6.1 (*)    Albumin 2.9 (*)    GFR, Estimated 52 (*)    All other components within normal limits  I-STAT CHEM 8, ED - Abnormal; Notable for the following components:   BUN 58 (*)    Creatinine, Ser 1.30 (*)    Glucose, Bld 106 (*)    Hemoglobin 8.8 (*)    HCT 26.0 (*)    All other components within normal limits  RESP PANEL BY RT-PCR (FLU A&B,  COVID) ARPGX2  TYPE AND SCREEN  PREPARE RBC (CROSSMATCH)    EKG None  Radiology CT Angio Abd/Pel W and/or Wo Contrast  Result Date: 03/06/2021 CLINICAL DATA:  75 year old with abdominal pain, aortic dissection suspected. GI bleed and hematuria. Rule out abdominal aortic aneurysm. EXAM: CTA ABDOMEN AND PELVIS WITHOUT AND WITH CONTRAST TECHNIQUE: Multidetector CT imaging of the abdomen and pelvis was performed using the standard  protocol during bolus administration of intravenous contrast. Multiplanar reconstructed images and MIPs were obtained and reviewed to evaluate the vascular anatomy. CONTRAST:  113mL OMNIPAQUE IOHEXOL 350 MG/ML SOLN COMPARISON:  11/07/2020 FINDINGS: VASCULAR Aorta: Infrarenal abdominal aortic aneurysm measures up to 5.6 cm and previously measured 5.3 cm on 11/07/2020. Again noted is a large amount of mural thrombus within the abdominal aortic aneurysm. Mural thrombus within the aneurysm may have slightly decreased in the interim. Diffuse atherosclerotic disease and irregularity in the distal descending thoracic aorta. Distal descending thoracic aorta measures 3.7 cm and stable. No evidence for an aortic dissection. Small focal outpouchings in the distal descending thoracic aorta could be related to small penetrating ulcers. Most prominent outpouching is near the aortic hiatus and this measures approximately 0.7 cm in the AP dimension and similar to the previous examination. Aorta at the level of celiac artery measures 3.1 cm. Juxtarenal abdominal aorta measures approximately 3.0 cm in the transverse dimension. Celiac: Patent without evidence of aneurysm, dissection, vasculitis or significant stenosis. SMA: Acute angulation of the proximal SMA. There is mild narrowing at this area of angulation. Renals: Both renal arteries are patent without evidence of aneurysm, dissection, vasculitis, fibromuscular dysplasia or significant stenosis. Small accessory left renal artery. IMA: Origin appears to be occluded due to the aortic aneurysm and mural thrombus. 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 Right internal iliac artery is patent but there is significant disease near the origin with poorly defined saccular aneurysm near the origin and proximal aspect of the right internal iliac artery measuring 8-9 mm. Right external iliac artery is tortuous but patent without significant stenosis.  Left common iliac artery measures up to 1.3 cm. Left external iliac artery is tortuous but patent without significant stenosis. Left internal iliac artery is patent. Proximal Outflow: Proximal femoral arteries are patent bilaterally. Veins: Hepatic veins and main portal venous system are patent. Portal vein thrombus has resolved. Review of the MIP images confirms the above findings. NON-VASCULAR Lower chest: Lung bases are clear. Hepatobiliary: Calcified gallstones. Gallbladder appears to be decompressed. No focal liver lesion. No significant biliary dilatation. Pancreas: Again noted are calcifications at the pancreatic head. Poorly defined hypodensities at the pancreatic head are similar to the previous examination. No significant pancreatic duct dilatation or inflammation. Spleen: Normal in size without focal abnormality. Adrenals/Urinary Tract: Normal adrenal glands. Bilateral renal cysts. There is slightly hyperdense structure along the posterior left kidney on sequence 12, image 27. No enhancement within this hyperdense structure and probably represents a proteinaceous or hemorrhagic cyst. Vascular calcifications in the kidneys. There may be a tiny nonobstructive left kidney stone. Foley catheter is present. There is high-density material within the bladder which could represent wall calcifications or urinary calculi. Bladder is mildly distended despite having a Foley catheter. Tip of the Foley catheter is protruding along the anterior aspect of the bladder and probably within a diverticulum. Stomach/Bowel: Evidence for contrast extravasation in the proximal duodenum on sequence 5 image 58 and sequence 12, image 27. This is compatible with active GI bleeding in the  proximal duodenum. Tubular type structure in the duodenum on sequence 5 image 31 is nonspecific. High-density material in the right colon and cecum is seen on the pre contrast images. There is no evidence of acute bleeding in the colon. No evidence  for a bowel obstruction. Lymphatic: No abdominopelvic lymph node enlargement. Reproductive: Prostate is enlarged and there is a Foley catheter extending into the urinary bladder. Other: Negative for ascites.  Negative for free air. Musculoskeletal: Multilevel degenerative changes in the lumbar spine. IMPRESSION: VASCULAR 1. Active GI bleeding with contrast extravasation in the proximal duodenum. Etiology for the GI bleeding is uncertain. 2. Abdominal aortic aneurysm measuring up to 5.6 cm. Aneurysm has enlarged since May 2022 when it measured 5.3 cm. Recommend referral to a vascular specialist. This recommendation follows ACR consensus guidelines: White Paper of the ACR Incidental Findings Committee II on Vascular Findings. J Am Coll Radiol 2013; 10:789-794. 3. Portal venous thrombus has resolved. 4. 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 5. 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 6. 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 NON-VASCULAR 1. Foley catheter is present but the tip is protruding anterior to the bladder. There is wall thickening in this area and suspect this is chronic and may be associated with a bladder diverticulum. Reportedly, the patient has hematuria and the position of the Foley catheter may be contributing to the hematuria. 2. Possible bladder calculi. 3. Again noted is heterogeneity at the pancreatic head with some calcifications and low-density areas. Questionable diverticulum or tubular structure in the descending duodenum poorly characterized. Patient had a MRI of the abdomen to evaluate the pancreas in the past and recommend referral to that report for follow-up recommendations. 4. Cholelithiasis.  The gallbladder appears to be contracted. 5. Bilateral renal cysts. Hyperdense  cyst in left kidney upper pole is most compatible with a proteinaceous or hemorrhagic cyst. These results were called by telephone at the time of interpretation on 03/06/2021 at 1:52 pm to provider Ahaan Zobrist , who verbally acknowledged these results. Electronically Signed   By: Markus Daft M.D.   On: 03/06/2021 14:35    Procedures Procedures   Medications Ordered in ED Medications  0.9 %  sodium chloride infusion (Manually program via Guardrails IV Fluids) ( Intravenous MAR Hold 03/06/21 1517)  pantoprazole (PROTONIX) injection 40 mg ( Intravenous MAR Hold 03/06/21 1517)  pantoprozole (PROTONIX) 80 mg /NS 100 mL infusion (has no administration in time range)  sodium chloride 0.9 % bolus 1,000 mL (1,000 mLs Intravenous New Bag/Given 03/06/21 1220)  pantoprazole (PROTONIX) injection 40 mg (40 mg Intravenous Given 03/06/21 1220)  iohexol (OMNIPAQUE) 350 MG/ML injection 100 mL (100 mLs Intravenous Contrast Given 03/06/21 1253)    ED Course  I have reviewed the triage vital signs and the nursing notes.  Pertinent labs & imaging results that were available during my care of the patient were reviewed by me and considered in my medical decision making (see chart for details).    MDM Rules/Calculators/A&P                           75 yo M with a chief complaints of bloody stool and blood in his urine.  Noticed it this morning after starting Xarelto yesterday.  Patient complaining of abdominal discomfort but  only when he coughs.  Exquisitely tender on my exam.  Does have a palpable pulsatile mass in the abdomen though could be due to his cachexia and known AAA.  Blood pressure in the 80s upon arrival.  I think unlikely to be sepsis and more likely to be blood loss anemia.  We will start with a small bolus of IV fluids likely follow with blood.  Type and screen.  CT angiogram of the abdomen.  Hemoglobin is dropped 3 g since yesterday.  We will transfuse a unit of blood.  I discussed the case with Ellouise Newer, PA-C on for [facility].  Recommends hospitalist admission and they will come and evaluate the patient.  Discussed with the hospitalist, Dr. Marylyn Ishihara concerned about patient's stability requesting ICU evaluation.  CRITICAL CARE Performed by: Cecilio Asper   Total critical care time: 80 minutes  Critical care time was exclusive of separately billable procedures and treating other patients.  Critical care was necessary to treat or prevent imminent or life-threatening deterioration.  Critical care was time spent personally by me on the following activities: development of treatment plan with patient and/or surrogate as well as nursing, discussions with consultants, evaluation of patient's response to treatment, examination of patient, obtaining history from patient or surrogate, ordering and performing treatments and interventions, ordering and review of laboratory studies, ordering and review of radiographic studies, pulse oximetry and re-evaluation of patient's condition.  The patients results and plan were reviewed and discussed.   Any x-rays performed were independently reviewed by myself.   Differential diagnosis were considered with the presenting HPI.  Medications  0.9 %  sodium chloride infusion (Manually program via Guardrails IV Fluids) ( Intravenous MAR Hold 03/06/21 1517)  pantoprazole (PROTONIX) injection 40 mg ( Intravenous MAR Hold 03/06/21 1517)  pantoprozole (PROTONIX) 80 mg /NS 100 mL infusion (has no administration in time range)  sodium chloride 0.9 % bolus 1,000 mL (1,000 mLs Intravenous New Bag/Given 03/06/21 1220)  pantoprazole (PROTONIX) injection 40 mg (40 mg Intravenous Given 03/06/21 1220)  iohexol (OMNIPAQUE) 350 MG/ML injection 100 mL (100 mLs Intravenous Contrast Given 03/06/21 1253)    Vitals:   03/06/21 1145 03/06/21 1427 03/06/21 1505 03/06/21 1522  BP:  91/79 (!) 91/57 (!) 91/55  Pulse:  (!) 105 (!) 107 (!) 108  Resp:  (!) 22 (!) 26 18  Temp:   (!)  97.5 F (36.4 C) 97.8 F (36.6 C)  TempSrc:   Oral Axillary  SpO2:  100% 100% 100%  Weight: 59.4 kg     Height: 6\' 1"  (1.854 m)       Final diagnoses:  Upper GI bleed  Hypovolemic shock (HCC)    Admission/ observation were discussed with the admitting physician, patient and/or family and they are comfortable with the plan.   Final Clinical Impression(s) / ED Diagnoses Final diagnoses:  Upper GI bleed  Hypovolemic shock [facility])    Rx / DC Orders ED Discharge Orders     None        Deno Etienne, DO 03/06/21 1525

## 2021-03-06 NOTE — Consult Note (Signed)
Consultation  Referring Provider:    Dr. Tyrone Nine Primary Care Physician:  Axel Filler, MD Primary Gastroenterologist: Dr. [name]      Reason for Consultation:     Upper GI bleed         HPI:   [name] is a 75 y.o. male with a past medical history of abdominal aortic aneurysm, Barrett's esophagus, CAD (status post stent) started on Xarelto 03/05/2021, diverticulosis, reflux, lung cancer and multiple others, who presented to the ER today with complaint of melenic stools this morning.    Per internal medicine note from yesterday patient was following up on malnutrition and unintentional weight loss.  At that time noted he had had an ambulatory monitor for an irregular heart rate with episodes of bradycardia that showed premature atrial contractions and some paroxysmal A. fib.  (I am assuming this is why he was started on Xarelto)    Today, the patient explains that he went to the doctor yesterday" he changed around some of my medicines".  Apparently he stopped a medicine he was taking for hypertension and started him on a blood thinner (Xarelto).  Patient took 1 dose around 11:00 PM yesterday and woke up this morning and noticed he had a large amount of bright red blood in his urinary catheter and then proceeded to have a black or bloody stool followed by further episodes of hematochezia.  He presented to the ER and his continued to have multiple episodes of bloody stool (in fact patient had another episode while in the room).  Also describes some abdominal discomfort.  Previous to all of this and starting blood thinner he was feeling fairly well.      Denies fever, chills or weight loss.  ER course: Hemoglobin 9/19 11.9--> 9/20 8.4--> 8.8; CT angio per verbal report shows bleeding from the duodenum, labs currently, pulse 105, BP 91/71, was apparently hypotensive in the field  GI History: 11/12/2020 EGD - Normal esophagus. - Gastric erosions with no bleeding and no stigmata of  recent bleeding. Biopsied. - Mucosal changes in the duodenum. No clear cause for weight loss, no malignancy seen on EGD or colonoscopy. 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  11/12/2020 colonoscopy - Four 4 to 8 mm polyps in the proximal transverse colon and in the ascending colon, removed with a cold snare. Resected and retrieved. - One 5 mm polyp in the proximal sigmoid colon, removed with a cold snare. Resected and retrieved. - The examination was otherwise normal on direct and retroflexion views.  11/10/2020 patient seen in consult by her service in the hospital for weight loss, fatigue, anorexia and abnormal imaging of the pancreas (see that note for further   Past Medical History:  Diagnosis Date   AAA (abdominal aortic aneurysm) ([place])    followed by vasc surgery   Alcohol abuse    Barretts esophagus    bx distal esophagus 2011 neg    Bladder outlet obstruction 04/02/2018   CAD in native artery    a. s/p stent to prox RCA and mid Cx.   Cataract    Cholelithiasis    Chronic osteomyelitis involving ankle and foot, right ([place]) 07/13/2018   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   2006 - 3 adenomas largest 12 mm  2008 - no polyps 2013-  2 mm cecal polyp, adenoma, routine repeat colonoscopy 02/2017 approx 05/14/2017 18 polyps max 12 mm ALL adenomas recall 2019 -  [name],  MD, [facility]      Diverticulosis of colon    DJD (degenerative joint disease)    left hip   Full dentures    upper and lower   GERD (gastroesophageal reflux disease)    diet controlled    Gout    feet   Hiatal hernia    2 cm noted on upper endos 2011    Hx of adenomatous colonic polyps    Hyperlipidemia    Hypertension    Internal hemorrhoid    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 pt was on Plavix x 3 years then transitioned to ASA, no problems since    Non-small cell carcinoma of right lung, stage 1 ([place]) 01/30/2016   Renal failure, acute ([facility])    hx of 2/2 medication, resolved   Tobacco abuse    smoker .5 ppd x 55 yrs   UTI (lower urinary tract infection)     Past Surgical History:  Procedure Laterality Date   BIOPSY  11/12/2020   Procedure: BIOPSY;  Surgeon: Doran Stabler, MD;  Location: [place];  Service: Gastroenterology;;   cataract surgery Bilateral 2018   removal of cataracts   COLONOSCOPY     hx polyps - [name] 02/2012   COLONOSCOPY WITH PROPOFOL N/A 11/12/2020   Procedure: COLONOSCOPY WITH PROPOFOL;  Surgeon: Doran Stabler, MD;  Location: [place];  Service: Gastroenterology;  Laterality: N/A;   CORONARY ANGIOPLASTY WITH STENT PLACEMENT     ESOPHAGOGASTRODUODENOSCOPY (EGD) WITH PROPOFOL N/A 11/12/2020   Procedure: ESOPHAGOGASTRODUODENOSCOPY (EGD) WITH PROPOFOL;  Surgeon: Doran Stabler, MD;  Location: [place];  Service: Gastroenterology;  Laterality: N/A;   flexible cystourethroscopy  05/24/06   INGUINAL HERNIA REPAIR  2005   LUNG LOBECTOMY Right 11/07   RUL   MULTIPLE TOOTH EXTRACTIONS     POLYPECTOMY  11/12/2020   Procedure: POLYPECTOMY;  Surgeon: Doran Stabler, MD;  Location: [facility];  Service: Gastroenterology;;   UPPER GASTROINTESTINAL ENDOSCOPY      Family History  Problem Relation Age of Onset   Diabetes Mother    Hypertension Sister    Heart disease Sister    Diabetes Sister    Arthritis Brother    Gout Brother    Colon cancer Neg Hx    Esophageal cancer Neg Hx    Rectal cancer Neg Hx    Stomach cancer Neg Hx    Colon polyps Neg Hx     Social History   Tobacco Use   Smoking status: Every Day    Packs/day: 0.25    Years: 55.00    Pack years: 13.75    Types: Cigarettes   Smokeless tobacco: Never  Vaping Use   Vaping Use: Never used  Substance Use Topics   Alcohol use: Yes    Alcohol/week: 0.0 standard drinks    Comment: drinking 1-2 pts gin/week, (mixed with  tap water not tonic)   Drug use: No    Prior to Admission medications   Medication Sig Start Date End Date Taking? Authorizing Provider  albuterol (VENTOLIN HFA) 108 (90 Base) MCG/ACT inhaler INHALE 1-2 PUFFS BY MOUTH EVERY 6 HOURS AS NEEDED FOR WHEEZE OR SHORTNESS OF BREATH 02/13/21   Axel Filler, MD  HYDROcodone-acetaminophen [facility]) 7.5-325 MG tablet Take 1 tablet by mouth 2 (two) times daily as needed for moderate pain. 03/05/21  Axel Filler, MD  Multiple Vitamin (MULTIVITAMIN WITH MINERALS) TABS tablet Take 1 tablet by mouth daily.    [provider]  Rivaroxaban (XARELTO) 15 MG TABS tablet Take 1 tablet (15 mg total) by mouth daily with supper. 03/05/21   Axel Filler, MD  rosuvastatin (CRESTOR) 20 MG tablet Take 1 tablet (20 mg total) by mouth daily. 11/27/20   Axel Filler, MD  tamsulosin (FLOMAX) 0.4 MG CAPS capsule Take 2 capsules (0.8 mg total) by mouth every evening. 11/27/20   Axel Filler, MD  ZENPEP 20000-63000 units CPEP TAKE 3 CAPSULES BY MOUTH 3 (THREE) TIMES DAILY BEFORE MEALS. 02/13/21   Axel Filler, MD    Current Facility-Administered Medications  Medication Dose Route Frequency Provider Last Rate Last Admin   0.9 %  sodium chloride infusion (Manually program via Guardrails IV Fluids)   Intravenous Once Deno Etienne, DO       Current Outpatient Medications  Medication Sig Dispense Refill   albuterol (VENTOLIN HFA) 108 (90 Base) MCG/ACT inhaler INHALE 1-2 PUFFS BY MOUTH EVERY 6 HOURS AS NEEDED FOR WHEEZE OR SHORTNESS OF BREATH 6.7 each 5   HYDROcodone-acetaminophen (NORCO) 7.5-325 MG tablet Take 1 tablet by mouth 2 (two) times daily as needed for moderate pain. 60 tablet 0   Multiple Vitamin (MULTIVITAMIN WITH MINERALS) TABS tablet Take 1 tablet by mouth daily.     Rivaroxaban (XARELTO) 15 MG TABS tablet Take 1 tablet (15 mg total) by mouth daily with supper. 30 tablet 0   rosuvastatin (CRESTOR) 20 MG tablet  Take 1 tablet (20 mg total) by mouth daily. 90 tablet 1   tamsulosin (FLOMAX) 0.4 MG CAPS capsule Take 2 capsules (0.8 mg total) by mouth every evening. 180 capsule 3   ZENPEP 20000-63000 units CPEP TAKE 3 CAPSULES BY MOUTH 3 (THREE) TIMES DAILY BEFORE MEALS. 270 capsule 0    Allergies as of 03/06/2021 - Review Complete 03/06/2021  Allergen Reaction Noted   Candesartan cilexetil-hctz Other (See Comments)    Hydrochlorothiazide Other (See Comments)    Shellfish allergy Other (See Comments) 03/24/2013     Review of Systems:    Constitutional: No fever or chills Skin: No rash Cardiovascular: No chest pain  Respiratory: No SOB Gastrointestinal: See HPI and otherwise negative Genitourinary: No dysuria  Neurological: No headache, dizziness or syncope Musculoskeletal: No new muscle or joint pain Hematologic: No bruising Psychiatric: No history of depression or anxiety    Physical Exam:  Vital signs in last 24 hours: Temp:  [97.5 F (36.4 C)] 97.5 F (36.4 C) (09/20 1128) Pulse Rate:  [102-105] 105 (09/20 1427) Resp:  [22] 22 (09/20 1427) BP: (87-91)/(72-79) 91/79 (09/20 1427) SpO2:  [98 %-100 %] 100 % (09/20 1427) Weight:  [59.4 kg] 59.4 kg (09/20 1145)   General:   Pleasant thin, cachectic appearing, chronically ill appearing AA male appears to be in NAD, Well developed, alert and cooperative Head:  Normocephalic and atraumatic. Eyes:   PEERL, EOMI. No icterus. Conjunctiva pink. Ears:  Normal auditory acuity. Neck:  Supple Throat: Oral cavity and pharynx without inflammation, swelling or lesion.  Lungs: Respirations even and unlabored. Lungs clear to auscultation bilaterally.   No wheezes, crackles, or rhonchi.  Heart: Normal S1, S2. No MRG.tachycardic, Irregularly irregular heart rate. No peripheral edema, cyanosis or pallor.  Abdomen:  Soft, nondistended, nontender. No rebound or guarding. Normal bowel sounds. No appreciable masses or hepatomegaly. Rectal:  maroon/dark purple  colored blood from rectum Msk:  Symmetrical without  gross deformities. Peripheral pulses intact.  Extremities:  Without edema, no deformity or joint abnormality.  Neurologic:  Alert and  oriented x4;  grossly normal neurologically.  Skin:   Dry and intact without significant lesions or rashes. Psychiatric: Demonstrates good judgement and reason without abnormal affect or behaviors.   LAB RESULTS: Recent Labs    03/05/21 1157 03/06/21 1211 03/06/21 1226  WBC 4.8 7.5  --   HGB 11.9* 8.4* 8.8*  HCT 35.8* 26.6* 26.0*  PLT 131* 112*  --    BMET Recent Labs    03/05/21 1157 03/06/21 1211 03/06/21 1226  NA 140 145 142  K 4.3 5.1 4.9  CL 102 111 108  CO2 25 25  --   GLUCOSE 98 110* 106*  BUN 19 52* 58*  CREATININE 1.18 1.42* 1.30*  CALCIUM 10.0 9.3  --    LFT Recent Labs    03/06/21 1211  PROT 6.1*  ALBUMIN 2.9*  AST 30  ALT 13  ALKPHOS 52  BILITOT 0.5     Impression / Plan:   Impression: 1.  Active upper GI bleed: Hemoglobin 11.9--> 8.8, BUN elevated at 58, hypotensive and tachycardic, overnight with frequent episodes of maroon/purple color blood per rectum as well as hematuria (witnessed in urinary catheter), CTA this afternoon showing evidence for contrast extravasation in the proximal duodenum compatible with active GI bleeding, EGD in May with mucosal changes in the duodenum and altered anatomy 2.  Acute blood loss anemia 3.  Unexplained weight loss: Previously worked up with EGD and colonoscopy in May, patient noted to have chronic pancreatitis 4.  Previously diagnosed with nonocclusive PVT: Per oncology consult at last hospitalization in May Dr. Benay Spice stated that "I am not sure I would entertain any oral anticoagulation with him.  I would worry about something happening to him and he having a bleed" 5.  A. fib: Started on Xarelto yesterday, took 1 dose around 11 PM, now with bleed 6.  Known infrarenal aortic aneurysm  Plan: 1.  Plans for emergent EGD with  Dr. Havery Moros.  Did discuss risks, benefits, limitations and alternatives with the patient and he agrees to proceed. 2.  Discussed placing another IV with nursing staff 3.  1 unit PRBCs has been ordered stat, nursing staff was going to get this at time of consultation.  Discussed with hospitalist who recommends ordering at least 2 more given amount of bleeding. 4.  Patient has had 1 L bolus of fluids, may require more 5.  Continue to monitor hemoglobin with transfusion as needed less than 7 6.  Patient has been given pantoprazole 40 mg IV once.  Instructed nursing staff to give another 40 mg now. 7.  More recommendations to come after EGD.  Thank you for your kind consultation, we will continue to follow.  Lavone Nian [facility]  03/06/2021, 2:31 PM

## 2021-03-06 NOTE — Transfer of Care (Signed)
Immediate Anesthesia Transfer of Care Note  Patient: [name]  Procedure(s) Performed: ESOPHAGOGASTRODUODENOSCOPY (EGD) WITH PROPOFOL HOT HEMOSTASIS (ARGON PLASMA COAGULATION/BICAP) HEMOSTASIS CLIP PLACEMENT  Patient Location: PACU  Anesthesia Type:General  Level of Consciousness: awake, alert  and oriented  Airway & Oxygen Therapy: Patient Spontanous Breathing and Patient connected to face mask oxygen  Post-op Assessment: Report given to RN, Post -op Vital signs reviewed and stable and Patient moving all extremities X 4  Post vital signs: Reviewed and stable  Last Vitals:  Vitals Value Taken Time  BP 143/67 03/06/21 1640  Temp    Pulse 92   Resp 20 03/06/21 1641  SpO2 98   Vitals shown include unvalidated device data.  Last Pain:  Vitals:   03/06/21 1537  TempSrc: Axillary  PainSc:       Patients Stated Pain Goal: 2 (59/74/16 3845)  Complications: No notable events documented.

## 2021-03-06 NOTE — Progress Notes (Signed)
Gave handoff report to [facility] ED RN.

## 2021-03-06 NOTE — ED Notes (Signed)
Patient transported to CT 

## 2021-03-06 NOTE — ED Notes (Signed)
Pt having copious amounts of BRBPR.  Dr. Tyrone Nine is aware.

## 2021-03-06 NOTE — ED Notes (Signed)
Upon arrival to the unit w/ PRRBC's EGD team at bedside.  Ailene Ravel, RN aware of blood transfusion initiation and reports they will preform 15 minute check of pt status and toleration of blood administration.  Pt transported to endoscopy.

## 2021-03-06 NOTE — Op Note (Signed)
[facility] Patient Name: [name] Procedure Date: 03/06/2021 MRN: [id number] Attending MD: Carlota Raspberry. Havery Moros , MD Date of Birth: 13-Sep-1945 CSN: [id number] Age: 75 Admit Type: Emergency Department Procedure:                Upper GI endoscopy Indications:              Active upper gastrointestinal bleeding -- CTA with                            active bleeding in the duodenal bulb. On Xarelto -                            last dose > 24 hours yesterday AM. 3 point drop in                            Hgb from 11s to 8s. Patient electively intubated                            per anesthesia for this exam. Providers:                Carlota Raspberry. Havery Moros, MD, Doristine Johns, RN,                            Laverda Sorenson, Technician, Maudry Diego, CRNA Referring MD:              Medicines:                Monitored Anesthesia Care Complications:            none Estimated Blood Loss:     active bleeding as noted above treated with                            endoscopic hemostasis Procedure:                Pre-Anesthesia Assessment:                           - Prior to the procedure, a History and Physical                            was performed, and patient medications and                            allergies were reviewed. The patient's tolerance of                            previous anesthesia was also reviewed. The risks                            and benefits of the procedure and the sedation                            options and risks were discussed with the patient.  All questions were answered, and informed consent                            was obtained. Prior Anticoagulants: The patient has                            taken Xarelto (rivaroxaban), last dose was 1 day                            prior to procedure. ASA Grade Assessment: III - A                            patient with severe systemic disease. After                             reviewing the risks and benefits, the patient was                            deemed in satisfactory condition to undergo the                            procedure.                           After obtaining informed consent, the endoscope was                            passed under direct vision. Throughout the                            procedure, the patient's blood pressure, pulse, and                            oxygen saturations were monitored continuously. The                            [id number]-[id number] ([id number]) Olympus endoscope was introduced                            through the mouth, and advanced to the second part                            of duodenum. The upper GI endoscopy was                            accomplished without difficulty. The patient                            tolerated the procedure well. Scope In: Scope Out: Findings:      The Z-line was regular.      The exam of the esophagus was otherwise normal.      Old blood and clots was found in the entire examined stomach. Most of       this was able to be cleared and  no active bleeding or stigmata of       bleeding noted in the stomach. Fresh blood noted in the antrum which was       lavaged.      The exam of the stomach was otherwise normal.      Red blood was found in the entire duodenum with clots that was cleared /       lavaged.      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 Fulguration to stop the bleeding by argon plasma       was successful. Two hemostatic clips were then successfully placed       across the lesion with good result. There was no bleeding at the end of       the procedure.      The mucosa of the ampulla appeared abnormal - diffusely appeared       adenomatous. Biopsies not taken given the patient's recent severe       bleeding.      There was a benign cystic lesion in the distal duodenal bulb. The exam       of the duodenum was  otherwise normal. Impression:               - Z-line regular.                           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                           - NPO for now.                           - Continue present medications.                           -  IV protonix 56m BID                           - Continue to hold Xarelto                           - Trend Hgb, transfuse if needed                           - Call with any recurrent active bleeding. The                            patient has fresh blood in his bowel from recent                            bleeding, he will continue to pass blood per rectum                            for the next 1-2 days (should be old blood)                           - GI service will continue to follow Procedure Code(s):        --- Professional ---                           [phone] Esophagogastroduodenoscopy, flexible,                            transoral; with control of bleeding, any method Diagnosis Code(s):        --- Professional ---                           K92.2, Gastrointestinal hemorrhage, unspecified                           K31.89, Other diseases of stomach and duodenum CPT copyright 2019 [employer]. All rights reserved. The codes documented in this report are preliminary and upon coder review may  be revised to meet current compliance requirements. SRemo LippsP.  AHavery Moros MD 03/06/2021 4:48:14 PM This report has been signed electronically. Number of Addenda: 0

## 2021-03-06 NOTE — Assessment & Plan Note (Signed)
Patient has persistent symptoms and UA findings of UTI. He has risk factors including indwelling urethral catheter and urine retention. He is currently taking macrobid for culture positive enterococcus infection, but still has symptoms. UA with green appearance, triple phosphate crystals, many bacteria, high pH, suggestive of ammonia producing organism like klebsiella. I will wait for the urine culture and treat with another course of antibiotics. He has follow up with urology on 9/30, hopefully he can find a solution to ultimately remove the catheter and fix the urine retention problem.

## 2021-03-06 NOTE — Consult Note (Signed)
NAME:  [name], MRN:  [id number], DOB:  11-Feb-1946, LOS: 0 ADMISSION DATE:  03/06/2021, CONSULTATION DATE:  9/20 REFERRING MD:  Tyrone Nine, CHIEF COMPLAINT:  GI bleed and assessment for ICU care    History of Present Illness:  75 year old male w/ hx as per below. Just started on Xarelto ~24 hrs (9/19 for AF). Presents to ER 9/20 w cc: abd discomfort and passing dark stools and bloody foley output. The stools progressed over the course of day so EMS called. On arrival her was hypotensive and tachycardic. IV access established and IVFs provided in transport. In arrival to ER SBP 80s hgb dropped from 11.9 the day prior to 8.4. CT scan 1. Active GI bleeding with contrast extravasation in the proximal duodenum. Etiology for the GI bleeding is uncertain. He was transfused blood urgently. GI medicine was consulted and PCCM also consulted to evaluate for possible ICU needs. On PCCM arrival he is s/p blood. He is alert, oriented. HR 70-80s AF, SBP 110-120s. He is pending endoscopic eval  Pertinent  Medical History  Atrial fib (just started on DOAC), AAA, CAD and prior MI, barretts,  Lung cancer 2007 Recent PVT Colon polyps Diverticulosis  Reflux ETOH abuse Chronic foley Mal nutrition Hiatal hernia Tobacco abuse Significant Hospital Events: Including procedures, antibiotic start and stop dates in addition to other pertinent events   9/20 admitted w/ acute GIB s/p starting xarelto 24hrs prior. Hypotensive and tachycardic initially. Received IV crystalloid and 1 unit of blood. Hemodynamics stable (HR 70s BP 110-120) in Endo holding when evaluated by pccm  Interim History / Subjective:  Feels a little better   Objective   Blood pressure 111/65, pulse 89, temperature 98.1 F (36.7 C), temperature source Axillary, resp. rate (Abnormal) 22, height 6\' 1"  (1.854 m), weight 59.4 kg, SpO2 96 %.        Intake/Output Summary (Last 24 hours) at 03/06/2021 1558 Last data filed at 03/06/2021 1512 Gross per  24 hour  Intake 250 ml  Output no documentation  Net 250 ml   Filed Weights   03/06/21 1145 03/06/21 1522  Weight: 59.4 kg 59.4 kg    Examination: General: 31 malnourished appearing 75 year old male not in acute distress currently  HENT: NCAT but does hav some temporal wasting  Lungs: cl, dec bases Cardiovascular: reg/irreg af w/ CVR Abdomen: soft not tender  Extremities: warm  Neuro: awake and oriented GU: voids   Resolved Hospital Problem list    Assessment & Plan:   Acute upper GI bleed w/ associated hemorrhagic shock (shock resolved w/ volume and blood resuscitation)  Mild acute on chronic renal failure CKD Stage 3a - GFR 45 to 59 (Mildly to moderately decreased) Atrial fib (just started on DOAC) H/o AAA, CAD and prior MI Barretts esophagus H/o Lung cancer 2007 Recent PVT Colon polyps Diverticulosis  Reflux ETOH abuse Chronic foley Mal nutrition Hiatal hernia Tobacco abuse  PCCM problem list  Acute upper GI bleed w/ associated hemorrhagic shock (shock resolved w/ volume and blood resuscitation)  -CT imaging noting active extravasation in prox duodenum  -now hemodynamically stable but if actively bleeding this could change Plan Emergent endoscopy if unable to control bleeding may need IR consult Serial CBCs and transfuse as needed No more DOACs OK to admit to SDU w/ close obs. PCCM available as needed   Best Practice (right click and "Reselect all SmartList Selections" daily)   Per primary   Labs   CBC: Recent Labs  Lab  03/05/21 1157 03/06/21 1211 03/06/21 1226  WBC 4.8 7.5  --   NEUTROABS  --  6.5  --   HGB 11.9* 8.4* 8.8*  HCT 35.8* 26.6* 26.0*  MCV 93 99.3  --   PLT 131* 112*  --     Basic Metabolic Panel: Recent Labs  Lab 03/05/21 1157 03/06/21 1211 03/06/21 1226  NA 140 145 142  K 4.3 5.1 4.9  CL 102 111 108  CO2 25 25  --   GLUCOSE 98 110* 106*  BUN 19 52* 58*  CREATININE 1.18 1.42* 1.30*  CALCIUM 10.0 9.3  --   MG 2.0  --    --   PHOS 3.2  --   --    GFR: Estimated Creatinine Clearance: 41.3 mL/min (A) (by C-G formula based on SCr of 1.3 mg/dL (H)). Recent Labs  Lab 03/05/21 1157 03/06/21 1211  WBC 4.8 7.5    Liver Function Tests: Recent Labs  Lab 03/05/21 1157 03/06/21 1211  AST 30 30  ALT 14 13  ALKPHOS 80 52  BILITOT 0.3 0.5  PROT 7.0 6.1*  ALBUMIN 3.5* 2.9*   No results for input(s): LIPASE, AMYLASE in the last 168 hours. No results for input(s): AMMONIA in the last 168 hours.  ABG    Component Value Date/Time   TCO2 25 03/06/2021 1226     Coagulation Profile: No results for input(s): INR, PROTIME in the last 168 hours.  Cardiac Enzymes: No results for input(s): CKTOTAL, CKMB, CKMBINDEX, TROPONINI in the last 168 hours.  HbA1C: Hemoglobin A1C  Date/Time Value Ref Range Status  02/14/2020 10:49 AM 5.6 4.0 - 5.6 % Final  08/09/2019 09:13 AM 5.6 4.0 - 5.6 % Final   Hgb A1c MFr Bld  Date/Time Value Ref Range Status  11/07/2020 06:18 AM 5.4 4.8 - 5.6 % Final    Comment:    (NOTE)         Prediabetes: 5.7 - 6.4         Diabetes: >6.4         Glycemic control for adults with diabetes: <7.0     CBG: No results for input(s): GLUCAP in the last 168 hours.  Review of Systems:   Review of Systems  Constitutional:  Positive for malaise/fatigue.  HENT: Negative.    Eyes: Negative.   Respiratory: Negative.    Cardiovascular: Negative.   Gastrointestinal:  Positive for abdominal pain.  Genitourinary: Negative.   Musculoskeletal: Negative.   Skin: Negative.   Neurological: Negative.   Endo/Heme/Allergies: Negative.   Psychiatric/Behavioral: Negative.      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   Surgical History:   Past Surgical History:  Procedure Laterality Date   BIOPSY  11/12/2020   Procedure: BIOPSY;  Surgeon: Doran Stabler, MD;  Location: [facility] ENDOSCOPY;  Service: Gastroenterology;;   cataract surgery Bilateral 2018   removal of cataracts   COLONOSCOPY     hx polyps - [name]  02/2012   COLONOSCOPY WITH PROPOFOL N/A 11/12/2020   Procedure: COLONOSCOPY WITH PROPOFOL;  Surgeon: Doran Stabler, MD;  Location: [place];  Service: Gastroenterology;  Laterality: N/A;   CORONARY ANGIOPLASTY WITH STENT PLACEMENT     ESOPHAGOGASTRODUODENOSCOPY (EGD) WITH PROPOFOL N/A 11/12/2020   Procedure: ESOPHAGOGASTRODUODENOSCOPY (EGD) WITH PROPOFOL;  Surgeon: Doran Stabler, MD;  Location: [place];  Service: Gastroenterology;  Laterality: N/A;   flexible cystourethroscopy  05/24/06   INGUINAL HERNIA REPAIR  2005   LUNG LOBECTOMY Right 11/07   RUL   MULTIPLE TOOTH EXTRACTIONS     POLYPECTOMY  11/12/2020   Procedure: POLYPECTOMY;  Surgeon: Doran Stabler, MD;  Location: [place];  Service: Gastroenterology;;   UPPER GASTROINTESTINAL ENDOSCOPY       Social History:   reports that he has been smoking cigarettes. He has a 13.75 pack-year smoking history. He has never used smokeless tobacco. He reports current alcohol use. He reports that he does not use drugs.   Family History:  His family history includes Arthritis in his brother; Diabetes in his mother and sister; Gout in his brother; Heart disease in his sister; Hypertension in his sister. There  is no history of Colon cancer, Esophageal cancer, Rectal cancer, Stomach cancer, or Colon polyps.   Allergies Allergies  Allergen Reactions   Candesartan Cilexetil-Hctz Other (See Comments)    Acute renal failure   Hydrochlorothiazide Other (See Comments)    Acute renal failure   Shellfish Allergy Other (See Comments)    Gout occurs     Home Medications  Prior to Admission medications   Medication Sig Start Date End Date Taking? Authorizing Provider  albuterol (VENTOLIN HFA) 108 (90 Base) MCG/ACT inhaler INHALE 1-2 PUFFS BY MOUTH EVERY 6 HOURS AS NEEDED FOR WHEEZE OR SHORTNESS OF BREATH 02/13/21   Axel Filler, MD  HYDROcodone-acetaminophen [facility]) 7.5-325 MG tablet Take 1 tablet by mouth 2 (two) times daily as needed for moderate pain. 03/05/21   Axel Filler, MD  Multiple Vitamin (MULTIVITAMIN WITH MINERALS) TABS tablet Take 1 tablet by mouth daily.    [provider]  Rivaroxaban (XARELTO) 15 MG TABS tablet Take 1 tablet (15 mg total) by mouth daily with supper. 03/05/21   Axel Filler, MD  rosuvastatin (CRESTOR) 20 MG tablet Take 1 tablet (20 mg total) by mouth daily. 11/27/20   Axel Filler, MD  tamsulosin (FLOMAX) 0.4 MG CAPS capsule Take 2 capsules (0.8 mg total) by mouth every evening. 11/27/20   Axel Filler, MD  ZENPEP 20000-63000 units CPEP TAKE 3 CAPSULES BY MOUTH 3 (THREE) TIMES DAILY BEFORE MEALS. 02/13/21   Axel Filler, MD     Critical care NA   Erick Colace ACNP-BC [place] Pager # [phone] OR # [phone] if no answer

## 2021-03-06 NOTE — ED Notes (Signed)
The blood bank has a unit of blood ready for this patient. Notified Kari,W., RN.

## 2021-03-06 NOTE — H&P (Signed)
[facility] History and Physical  LASHAN GLUTH UYQ:[id number] DOB: [birth year] DOA: 03/06/2021 PCP: Axel Filler, MD  Admitted from: Home Chief Complaint: BRBPR  History of Present Illness: [name] is a 75 y.o. male HTN, HLD, CAD/MI s/p stents, A. fib, AAA, pancreatic mass, CKD, GERD, Barrett's esophagus, lung cancer s/p resection, BPH.   Patient presented to the ED today with complaint of BRBPR. Patient was seen by his PCP yesterday 9/19 for follow-up of malnutrition and unintentional weight loss of about 40 pounds in 1 year. Evidently he was started on Xarelto yesterday for A. fib.  He took the first dose of medicine yesterday at about 11 AM. This morning, patient woke up with abdominal discomfort and also noticed blood in his urine and stool.  Blood loss progressed over the day without nausea or vomiting.  EMS noted blood pressure was low in 60s in the field and started on IV fluid and brought to the ED.  In the ED, patient was tachycardic over 100, blood pressure was low in the 80s which improved to 90s with IV fluid. Labs with a hemoglobin low at 8.4 compared to 11.9 yesterday. He was given a unit of PRBC.  He continued to have BRBPR in the ED. CTA suggested an active bleeding from the duodenal bulb GI consultation was obtained and he was taken for urgent endoscopy. He was noted to have active bleeding in the duodenal area, hemostasis achieved with intervention. Critical care consultation was obtained.   Patient remained hemodynamically stable.  Hospital service was consulted for inpatient admission and management.  At the time of my evaluation, patient was drowsy, under the effect of anesthesia wearing off.  He would wake up and answer questions and get right back to sleep.  He states that he was on baby aspirin and was switched to Xarelto after that.    Review of Systems:  All systems were reviewed and were negative unless otherwise mentioned in the  HPI   Past medical history: Past Medical History:  Diagnosis Date   AAA (abdominal aortic aneurysm) ([place])    followed by vasc surgery   Alcohol abuse    Barretts esophagus    bx distal esophagus 2011 neg    Bladder outlet obstruction 04/02/2018   CAD in native artery    a. s/p stent to prox RCA and mid Cx.   Cataract    Cholelithiasis    Chronic osteomyelitis involving ankle and foot, right ([place]) 07/13/2018   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   2006 - 3 adenomas largest 12 mm  2008 - no polyps 2013-  2 mm cecal polyp, adenoma, routine repeat colonoscopy 02/2017 approx 05/14/2017 18 polyps max 12 mm ALL adenomas recall 2019 [name], MD, [facility]      Diverticulosis of colon    DJD (degenerative joint disease)    left hip   Full dentures    upper and lower   GERD (gastroesophageal reflux disease)    diet controlled    Gout    feet   Hiatal hernia    2 cm noted on upper endos 2011    Hx of adenomatous colonic polyps    Hyperlipidemia    Hypertension    Internal hemorrhoid    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 pt was on Plavix x 3 years then transitioned to  ASA, no problems since   Non-small cell carcinoma of right lung, stage 1 ([place]) 01/30/2016   Renal failure, acute (HCC)    hx of 2/2 medication, resolved   Tobacco abuse    smoker .5 ppd x 55 yrs   UTI (lower urinary tract infection)     Past surgical history: Past Surgical History:  Procedure Laterality Date   BIOPSY  11/12/2020   Procedure: BIOPSY;  Surgeon: Doran Stabler, MD;  Location: [facility] ENDOSCOPY;  Service: Gastroenterology;;   cataract surgery Bilateral 2018   removal of cataracts   COLONOSCOPY     hx polyps - [name] 02/2012   COLONOSCOPY WITH PROPOFOL N/A 11/12/2020   Procedure: COLONOSCOPY WITH PROPOFOL;  Surgeon: Doran Stabler, MD;  Location: [place];  Service:  Gastroenterology;  Laterality: N/A;   CORONARY ANGIOPLASTY WITH STENT PLACEMENT     ESOPHAGOGASTRODUODENOSCOPY (EGD) WITH PROPOFOL N/A 11/12/2020   Procedure: ESOPHAGOGASTRODUODENOSCOPY (EGD) WITH PROPOFOL;  Surgeon: Doran Stabler, MD;  Location: [place];  Service: Gastroenterology;  Laterality: N/A;   flexible cystourethroscopy  05/24/06   INGUINAL HERNIA REPAIR  2005   LUNG LOBECTOMY Right 11/07   RUL   MULTIPLE TOOTH EXTRACTIONS     POLYPECTOMY  11/12/2020   Procedure: POLYPECTOMY;  Surgeon: Doran Stabler, MD;  Location: [employer];  Service: Gastroenterology;;   UPPER GASTROINTESTINAL ENDOSCOPY      Social History:  reports that he has been smoking cigarettes. He has a 13.75 pack-year smoking history. He has never used smokeless tobacco. He reports current alcohol use. He reports that he does not use drugs.  Allergies:  Allergies  Allergen Reactions   Candesartan Cilexetil-Hctz Other (See Comments)    Acute renal failure   Hydrochlorothiazide Other (See Comments)    Acute renal failure   Shellfish Allergy Other (See Comments)    Gout occurs    Family history:  Family History  Problem Relation Age of Onset   Diabetes Mother    Hypertension Sister    Heart disease Sister    Diabetes Sister    Arthritis Brother    Gout Brother    Colon cancer Neg Hx    Esophageal cancer Neg Hx    Rectal cancer Neg Hx    Stomach cancer Neg Hx    Colon polyps Neg Hx      Home Meds: Prior to Admission medications   Medication Sig Start Date End Date Taking? Authorizing Provider  albuterol (VENTOLIN HFA) 108 (90 Base) MCG/ACT inhaler INHALE 1-2 PUFFS BY MOUTH EVERY 6 HOURS AS NEEDED FOR WHEEZE OR SHORTNESS OF BREATH 02/13/21   Axel Filler, MD  HYDROcodone-acetaminophen [facility]) 7.5-325 MG tablet Take 1 tablet by mouth 2 (two) times daily as needed for moderate pain. 03/05/21   Axel Filler, MD  Multiple Vitamin (MULTIVITAMIN WITH MINERALS) TABS tablet Take  1 tablet by mouth daily.    [provider]  Rivaroxaban (XARELTO) 15 MG TABS tablet Take 1 tablet (15 mg total) by mouth daily with supper. 03/05/21   Axel Filler, MD  rosuvastatin (CRESTOR) 20 MG tablet Take 1 tablet (20 mg total) by mouth daily. 11/27/20   Axel Filler, MD  tamsulosin (FLOMAX) 0.4 MG CAPS capsule Take 2 capsules (0.8 mg total) by mouth every evening. 11/27/20   Axel Filler, MD  ZENPEP 20000-63000 units CPEP TAKE 3 CAPSULES BY MOUTH 3 (THREE) TIMES DAILY BEFORE MEALS. 02/13/21   Axel Filler, MD  Physical Exam: Vitals:   03/06/21 1700 03/06/21 1710 03/06/21 1725 03/06/21 1745  BP: (!) 131/49 127/69 116/63 (!) 108/56  Pulse: 90 93 97 97  Resp: 18 (!) 29 18 18   Temp:   97.8 F (36.6 C)   TempSrc:   Oral   SpO2: 98% 100% 99% 96%  Weight:      Height:       Wt Readings from Last 3 Encounters:  03/06/21 59.4 kg  03/05/21 60.6 kg  02/22/21 61.7 kg   Body mass index is 17.28 kg/m.  General exam: Pleasant, elderly African-American male.  Lying down in bed.  Not in distress. Skin: No rashes, lesions or ulcers. HEENT: Atraumatic, normocephalic, no obvious bleeding Lungs: Clear to auscultation bilaterally CVS: Regular rate and rhythm, no murmur GI/Abd soft, mild tenderness, nondistended, bowel sound present CNS: Partially sedated, under the effect of anesthesia, answers few questions and falls right back asleep Psychiatry: Mood appropriate Extremities: No pedal edema, no calf tenderness     Consult Orders  (From admission, onward)           Start     Ordered   03/06/21 1428  Consult to hospitalist  Once       Provider:  (Not yet assigned)  Question Answer Comment  Place call to: [facility]   Reason for Consult Admit      03/06/21 1427            Labs on Admission:   CBC: Recent Labs  Lab 03/05/21 1157 03/06/21 1211 03/06/21 1226 03/06/21 1700  WBC 4.8 7.5  --   --   NEUTROABS  --   6.5  --   --   HGB 11.9* 8.4* 8.8* 6.8*  HCT 35.8* 26.6* 26.0* 21.6*  MCV 93 99.3  --   --   PLT 131* 112*  --   --     Basic Metabolic Panel: Recent Labs  Lab 03/05/21 1157 03/06/21 1211 03/06/21 1226  NA 140 145 142  K 4.3 5.1 4.9  CL 102 111 108  CO2 25 25  --   GLUCOSE 98 110* 106*  BUN 19 52* 58*  CREATININE 1.18 1.42* 1.30*  CALCIUM 10.0 9.3  --   MG 2.0  --   --   PHOS 3.2  --   --     Liver Function Tests: Recent Labs  Lab 03/05/21 1157 03/06/21 1211  AST 30 30  ALT 14 13  ALKPHOS 80 52  BILITOT 0.3 0.5  PROT 7.0 6.1*  ALBUMIN 3.5* 2.9*   No results for input(s): LIPASE, AMYLASE in the last 168 hours. No results for input(s): AMMONIA in the last 168 hours.  Cardiac Enzymes: No results for input(s): CKTOTAL, CKMB, CKMBINDEX, TROPONINI in the last 168 hours.  BNP (last 3 results) No results for input(s): BNP in the last 8760 hours.  ProBNP (last 3 results) No results for input(s): PROBNP in the last 8760 hours.  CBG: No results for input(s): GLUCAP in the last 168 hours.  Lipase     Component Value Date/Time   LIPASE 65 (H) 11/06/2020 1227     Urinalysis    Component Value Date/Time   COLORURINE YELLOW 12/06/2020 0000   APPEARANCEUR Turbid (A) 03/05/2021 1130   LABSPEC 1.010 12/06/2020 0000   PHURINE 5.0 12/06/2020 0000   GLUCOSEU Negative 03/05/2021 1130   GLUCOSEU NEG mg/dL 05/02/2008 2038   HGBUR NEGATIVE 12/06/2020 0000   BILIRUBINUR Negative 03/05/2021 1130   KETONESUR  NEGATIVE 12/06/2020 0000   PROTEINUR 4+ (A) 03/05/2021 1130   PROTEINUR NEGATIVE 12/06/2020 0000   UROBILINOGEN 0.2 02/15/2019 1016   UROBILINOGEN 1.0 03/02/2015 1130   NITRITE Negative 03/05/2021 1130   NITRITE NEGATIVE 12/06/2020 0000   LEUKOCYTESUR 2+ (A) 03/05/2021 1130   LEUKOCYTESUR LARGE (A) 12/06/2020 0000     Drugs of Abuse     Component Value Date/Time   LABOPIA PPS 06/30/2012 1616   LABOPIA POS (A) 01/01/2010 2026   COCAINSCRNUR NEG 06/30/2012  1616   LABBENZ NEG 06/30/2012 1616   LABBENZ NEG 01/01/2010 2026   AMPHETMU NEG 06/30/2012 1616   THCU NEG 06/30/2012 1616   LABBARB NEG 06/30/2012 1616      Radiological Exams on Admission: CT Angio Abd/Pel W and/or Wo Contrast  Result Date: 03/06/2021 CLINICAL DATA:  75 year old with abdominal pain, aortic dissection suspected. GI bleed and hematuria. Rule out abdominal aortic aneurysm. EXAM: CTA ABDOMEN AND PELVIS WITHOUT AND WITH CONTRAST TECHNIQUE: Multidetector CT imaging of the abdomen and pelvis was performed using the standard protocol during bolus administration of intravenous contrast. Multiplanar reconstructed images and MIPs were obtained and reviewed to evaluate the vascular anatomy. CONTRAST:  112mL OMNIPAQUE IOHEXOL 350 MG/ML SOLN COMPARISON:  11/07/2020 FINDINGS: VASCULAR Aorta: Infrarenal abdominal aortic aneurysm measures up to 5.6 cm and previously measured 5.3 cm on 11/07/2020. Again noted is a large amount of mural thrombus within the abdominal aortic aneurysm. Mural thrombus within the aneurysm may have slightly decreased in the interim. Diffuse atherosclerotic disease and irregularity in the distal descending thoracic aorta. Distal descending thoracic aorta measures 3.7 cm and stable. No evidence for an aortic dissection. Small focal outpouchings in the distal descending thoracic aorta could be related to small penetrating ulcers. Most prominent outpouching is near the aortic hiatus and this measures approximately 0.7 cm in the AP dimension and similar to the previous examination. Aorta at the level of celiac artery measures 3.1 cm. Juxtarenal abdominal aorta measures approximately 3.0 cm in the transverse dimension. Celiac: Patent without evidence of aneurysm, dissection, vasculitis or significant stenosis. SMA: Acute angulation of the proximal SMA. There is mild narrowing at this area of angulation. Renals: Both renal arteries are patent without evidence of aneurysm, dissection,  vasculitis, fibromuscular dysplasia or significant stenosis. Small accessory left renal artery. IMA: Origin appears to be occluded due to the aortic aneurysm and mural thrombus. 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 Right internal iliac artery is patent but there is significant disease near the origin with poorly defined saccular aneurysm near the origin and proximal aspect of the right internal iliac artery measuring 8-9 mm. Right external iliac artery is tortuous but patent without significant stenosis. Left common iliac artery measures up to 1.3 cm. Left external iliac artery is tortuous but patent without significant stenosis. Left internal iliac artery is patent. Proximal Outflow: Proximal femoral arteries are patent bilaterally. Veins: Hepatic veins and main portal venous system are patent. Portal vein thrombus has resolved. Review of the MIP images confirms the above findings. NON-VASCULAR Lower chest: Lung bases are clear. Hepatobiliary: Calcified gallstones. Gallbladder appears to be decompressed. No focal liver lesion. No significant biliary dilatation. Pancreas: Again noted are calcifications at the pancreatic head. Poorly defined hypodensities at the pancreatic head are similar to the previous examination. No significant pancreatic duct dilatation or inflammation. Spleen: Normal in size without focal abnormality. Adrenals/Urinary Tract: Normal adrenal glands. Bilateral renal cysts. There is slightly hyperdense structure along the posterior left  kidney on sequence 12, image 27. No enhancement within this hyperdense structure and probably represents a proteinaceous or hemorrhagic cyst. Vascular calcifications in the kidneys. There may be a tiny nonobstructive left kidney stone. Foley catheter is present. There is high-density material within the bladder which could represent wall calcifications or urinary calculi. Bladder is mildly distended despite having a Foley  catheter. Tip of the Foley catheter is protruding along the anterior aspect of the bladder and probably within a diverticulum. Stomach/Bowel: Evidence for contrast extravasation in the proximal duodenum on sequence 5 image 58 and sequence 12, image 27. This is compatible with active GI bleeding in the proximal duodenum. Tubular type structure in the duodenum on sequence 5 image 31 is nonspecific. High-density material in the right colon and cecum is seen on the pre contrast images. There is no evidence of acute bleeding in the colon. No evidence for a bowel obstruction. Lymphatic: No abdominopelvic lymph node enlargement. Reproductive: Prostate is enlarged and there is a Foley catheter extending into the urinary bladder. Other: Negative for ascites.  Negative for free air. Musculoskeletal: Multilevel degenerative changes in the lumbar spine. IMPRESSION: VASCULAR 1. Active GI bleeding with contrast extravasation in the proximal duodenum. Etiology for the GI bleeding is uncertain. 2. Abdominal aortic aneurysm measuring up to 5.6 cm. Aneurysm has enlarged since May 2022 when it measured 5.3 cm. Recommend referral to a vascular specialist. This recommendation follows ACR consensus guidelines: White Paper of the ACR Incidental Findings Committee II on Vascular Findings. J Am Coll Radiol 2013; 10:789-794. 3. Portal venous thrombus has resolved. 4. 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 5. 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 6. 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 NON-VASCULAR 1. Foley catheter is present but the tip is protruding anterior to the bladder. There is wall thickening in this area and suspect this is chronic and may be associated with a bladder diverticulum. Reportedly, the patient has  hematuria and the position of the Foley catheter may be contributing to the hematuria. 2. Possible bladder calculi. 3. Again noted is heterogeneity at the pancreatic head with some calcifications and low-density areas. Questionable diverticulum or tubular structure in the descending duodenum poorly characterized. Patient had a MRI of the abdomen to evaluate the pancreas in the past and recommend referral to that report for follow-up recommendations. 4. Cholelithiasis.  The gallbladder appears to be contracted. 5. Bilateral renal cysts. Hyperdense cyst in left kidney upper pole is most compatible with a proteinaceous or hemorrhagic cyst. These results were called by telephone at the time of interpretation on 03/06/2021 at 1:52 pm to provider DAN FLOYD , who verbally acknowledged these results. Electronically Signed   By: Markus Daft M.D.   On: 03/06/2021 14:35     ------------------------------------------------------------------------------------------------------ Assessment/Plan: Active Problems:   Upper GI bleed   Anticoagulated   Symptomatic anemia   Hypovolemic shock (HCC)   Dieulafoy lesion of duodenum   Acute GI bleeding  Acute GI bleeding -Apparently after taking 1 dose of Xarelto. -Presented with multiple episodes of BRBPR -Underwent EGD this afternoon.  Per EGD note, patient had old blood/clot in the entire stomach.  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  He was also noted  to have abnormal appearing ampulla concerning for adenomatous change but biopsy was not taken because of risk of rebleeding. -Patient was started on IV Protonix twice daily. -Xarelto obviously on hold.  Acute blood loss anemia Macrocytic anemia of chronic disease -Hemoglobin dropped from 11.9-8.4 and 24 hours due to acute severe duodenal bleeding.  1 unit of PRBC was transfused in the ED. repeat hemoglobin is  further low at 6.8.  2 more units of PRBC transfusion ordered. -Continue to monitor hemoglobin. Recent Labs    11/06/20 1844 11/07/20 0618 11/13/20 0136 03/05/21 1157 03/06/21 1211 03/06/21 1226 03/06/21 1700  HGB  --    < > 9.1* 11.9* 8.4* 8.8* 6.8*  MCV  --    < > 101.1* 93 99.3  --   --   VITAMINB12 958*  --   --   --   --   --   --   FOLATE 32.9  --   --   --   --   --   --   FERRITIN 489*  --   --   --   --   --   --   TIBC 140*  --   --   --   --   --   --   IRON 38*  --   --   --   --   --   --   RETICCTPCT 1.2  --   --   --   --   --   --    < > = values in this interval not displayed.   Hematuria -Also complained of hematuria on admission. -Urinalysis with turbid yellow urine with 2+ leukocytes, many bacteria, crystals in yeast. -Expect improvement as Xarelto has been stopped.  Essential hypertension -Not on BP meds at home. Continue to monitor.  CAD/MI s/p stents HLD, AAA -Continue Crestor.  Prior to yesterday, patient was on baby aspirin which is switched to Xarelto.  Currently Xarelto on hold.  A. Fib -Xarelto on hold  CKD 2 -Creatinine at baseline. Recent Labs    11/07/20 0618 11/08/20 0408 11/09/20 0316 11/10/20 5643 11/11/20 0231 11/12/20 0711 11/13/20 0136 03/05/21 1157 03/06/21 1211 03/06/21 1226  BUN 23 16 13 11 9  7* 8 19 52* 58*  CREATININE 1.51* 1.20 1.12 1.09 1.10 1.25* 1.28* 1.18 1.42* 1.30*   History of pancreatic abnormality -he has a history of pancreatic abnormality which has been followed with MRCP most recently (May 2022 - mass not confidently identified)  lung cancer s/p resection, -Respiratory status stable  BPH -Continue Flomax  Mobility: Encourage ambulation Code Status:   Code Status: Full Code  DVT prophylaxis: SCDs Antimicrobials: None Fluid: Normal saline 75 mill per hour  Diet:  Diet Order             Diet clear liquid Room service appropriate? Yes; Fluid consistency: Thin  Diet effective now                     Consultants: GI, critical care Family Communication: None at bedside  Dispo: The patient is from: Home              Anticipated d/c is to: Home in 2 to 3 days  ------------------------------------------------------------------------------------- Severity of Illness: The appropriate patient status for this patient is INPATIENT. Inpatient status is judged to be reasonable and necessary in order to provide the required intensity of service to ensure the patient's safety. The patient's presenting symptoms, physical exam findings,  and initial radiographic and laboratory data in the context of their chronic comorbidities is felt to place them at high risk for further clinical deterioration. Furthermore, it is not anticipated that the patient will be medically stable for discharge from the hospital within 2 midnights of admission. The following factors support the patient status of inpatient.   " The patient's presenting symptoms include BRBPR. " The worrisome physical exam findings include abdomen tenderness. " The initial radiographic and laboratory data are worrisome because of anemia, duodenal bleeding in EGD. " The chronic co-morbidities include cardiovascular issues.   * I certify that at the point of admission it is my clinical judgment that the patient will require inpatient hospital care spanning beyond 2 midnights from the point of admission due to high intensity of service, high risk for further deterioration and high frequency of surveillance required.*  -------------------------------------------------------------------------------------  Cline Cools, MD [facility] 03/06/2021

## 2021-03-06 NOTE — ED Notes (Signed)
Dr. Havery Moros to bedside to evaluate pt.  Per Dr. Havery Moros give PRBC's as quickly as possible.  Pt for emergent EGD to stop active internal bleeding.

## 2021-03-06 NOTE — Anesthesia Procedure Notes (Signed)
Procedure Name: Intubation Date/Time: 03/06/2021 3:55 PM Performed by: Niel Hummer, CRNA Pre-anesthesia Checklist: Patient identified, Emergency Drugs available, Suction available and Patient being monitored Patient Re-evaluated:Patient Re-evaluated prior to induction Oxygen Delivery Method: Circle system utilized Preoxygenation: Pre-oxygenation with 100% oxygen Induction Type: IV induction Laryngoscope Size: Mac and 4 Grade View: Grade II Tube type: Oral Tube size: 7.5 mm Number of attempts: 1 Airway Equipment and Method: Stylet Placement Confirmation: positive ETCO2, ETT inserted through vocal cords under direct vision and breath sounds checked- equal and bilateral Secured at: 22 cm Tube secured with: Tape Dental Injury: Teeth and Oropharynx as per pre-operative assessment

## 2021-03-06 NOTE — Anesthesia Preprocedure Evaluation (Addendum)
Anesthesia Evaluation  Patient identified by MRN, date of birth, ID band Patient awake    Reviewed: Allergy & Precautions, NPO status , Patient's Chart, lab work & pertinent test results  History of Anesthesia Complications Negative for: history of anesthetic complications  Airway Mallampati: II  TM Distance: >3 FB Neck ROM: Full    Dental  (+) Edentulous Upper, Edentulous Lower   Pulmonary Current Smoker and Patient abstained from smoking.,   NSCLC s/p lobectomy     Pulmonary exam normal        Cardiovascular hypertension, + CAD, + Past MI, + Cardiac Stents and +CHF  + dysrhythmias  Rhythm:Regular Rate:Tachycardia   '22 Myoperfusion - Nuclear stress EF: 47%. The left ventricular ejection fraction is mildly decreased (45-54%). There was no ST segment deviation noted during stress. No T wave inversion was noted during stress. The study is normal. This is a low risk study.     Neuro/Psych negative neurological ROS  negative psych ROS   GI/Hepatic hiatal hernia, GERD  ,(+)     substance abuse  alcohol use,   Endo/Other   Pre-DM   Renal/GU CRFRenal disease     Musculoskeletal  (+) Arthritis ,  Gout    Abdominal   Peds  Hematology  (+) anemia ,  On xarelto Plt 112k    Anesthesia Other Findings   Reproductive/Obstetrics                          Anesthesia Physical Anesthesia Plan  ASA: 3  Anesthesia Plan: General   Post-op Pain Management:    Induction: Intravenous and Rapid sequence  PONV Risk Score and Plan: 1 and Treatment may vary due to age or medical condition and Ondansetron  Airway Management Planned: Oral ETT  Additional Equipment: None  Intra-op Plan:   Post-operative Plan: Extubation in OR  Informed Consent: I have reviewed the patients History and Physical, chart, labs and discussed the procedure including the risks, benefits and alternatives for the  proposed anesthesia with the patient or authorized representative who has indicated his/her understanding and acceptance.     Dental advisory given  Plan Discussed with: CRNA and Anesthesiologist  Anesthesia Plan Comments:       Anesthesia Quick Evaluation

## 2021-03-06 NOTE — ED Triage Notes (Signed)
Pt bib [facility] d/t GI bleeding as well as hematuria.  Pt was initially hypotensive in the field.  500 ml bolus of NS given en route.  Pt reports to EMS having a dark BM this AM.  Pt started on Xeralto yesterday.  Blood noted in urine drainage bag.

## 2021-03-07 ENCOUNTER — Other Ambulatory Visit: Payer: Self-pay

## 2021-03-07 ENCOUNTER — Encounter (HOSPITAL_COMMUNITY): Payer: Self-pay | Admitting: Gastroenterology

## 2021-03-07 DIAGNOSIS — K922 Gastrointestinal hemorrhage, unspecified: Secondary | ICD-10-CM | POA: Diagnosis not present

## 2021-03-07 LAB — CBC
HCT: 23.3 % — ABNORMAL LOW (ref 39.0–52.0)
Hemoglobin: 7.7 g/dL — ABNORMAL LOW (ref 13.0–17.0)
MCH: 30.7 pg (ref 26.0–34.0)
MCHC: 33 g/dL (ref 30.0–36.0)
MCV: 92.8 fL (ref 80.0–100.0)
Platelets: 82 10*3/uL — ABNORMAL LOW (ref 150–400)
RBC: 2.51 MIL/uL — ABNORMAL LOW (ref 4.22–5.81)
RDW: 15.7 % — ABNORMAL HIGH (ref 11.5–15.5)
WBC: 7.3 10*3/uL (ref 4.0–10.5)
nRBC: 0 % (ref 0.0–0.2)

## 2021-03-07 LAB — URINALYSIS, ROUTINE W REFLEX MICROSCOPIC
Bacteria, UA: NONE SEEN
Glucose, UA: NEGATIVE mg/dL
Ketones, ur: NEGATIVE mg/dL
Nitrite: NEGATIVE
Protein, ur: 100 mg/dL — AB
Specific Gravity, Urine: 1.015 (ref 1.005–1.030)
WBC, UA: >50 WBC/hpf — ABNORMAL HIGH (ref 0–5)
pH: 7 (ref 5.0–8.0)

## 2021-03-07 LAB — BASIC METABOLIC PANEL
Anion gap: 6 (ref 5–15)
BUN: 52 mg/dL — ABNORMAL HIGH (ref 8–23)
CO2: 23 mmol/L (ref 22–32)
Calcium: 8.3 mg/dL — ABNORMAL LOW (ref 8.9–10.3)
Chloride: 112 mmol/L — ABNORMAL HIGH (ref 98–111)
Creatinine, Ser: 1.17 mg/dL (ref 0.61–1.24)
GFR, Estimated: >60 mL/min (ref >60)
Glucose, Bld: 91 mg/dL (ref 70–99)
Potassium: 4.1 mmol/L (ref 3.5–5.1)
Sodium: 141 mmol/L (ref 135–145)

## 2021-03-07 LAB — PREPARE RBC (CROSSMATCH)

## 2021-03-07 LAB — HEMOGLOBIN AND HEMATOCRIT, BLOOD
HCT: 22.8 % — ABNORMAL LOW (ref 39.0–52.0)
Hemoglobin: 7.5 g/dL — ABNORMAL LOW (ref 13.0–17.0)

## 2021-03-07 MED ORDER — FOLIC ACID 1 MG PO TABS
1.0000 mg | ORAL_TABLET | Freq: Every day | ORAL | Status: DC
  Administered 2021-03-08 – 2021-03-11 (×4): 1 mg via ORAL
  Filled 2021-03-07 (×5): qty 1

## 2021-03-07 MED ORDER — ORAL CARE MOUTH RINSE: 15.0000 mL | Freq: Two times a day (BID) | OROMUCOSAL | Status: DC

## 2021-03-07 MED ORDER — BOOST / RESOURCE BREEZE PO LIQD CUSTOM
1.0000 | Freq: Three times a day (TID) | ORAL | Status: DC
  Administered 2021-03-07 – 2021-03-10 (×11): 1 via ORAL

## 2021-03-07 MED ORDER — THIAMINE HCL 100 MG/ML IJ SOLN
100.0000 mg | Freq: Every day | INTRAMUSCULAR | Status: DC
  Administered 2021-03-07 – 2021-03-08 (×2): 100 mg via INTRAVENOUS
  Filled 2021-03-07 (×2): qty 2

## 2021-03-07 MED ORDER — LORAZEPAM 1 MG PO TABS: 1.0000 mg | ORAL_TABLET | ORAL | Status: AC | PRN

## 2021-03-07 MED ORDER — PANTOPRAZOLE SODIUM 40 MG IV SOLR
40.0000 mg | Freq: Two times a day (BID) | INTRAVENOUS | Status: DC
  Administered 2021-03-07 – 2021-03-11 (×8): 40 mg via INTRAVENOUS
  Filled 2021-03-07 (×7): qty 40

## 2021-03-07 MED ORDER — ADULT MULTIVITAMIN W/MINERALS CH
1.0000 | ORAL_TABLET | Freq: Every day | ORAL | Status: DC
  Administered 2021-03-08 – 2021-03-11 (×4): 1 via ORAL
  Filled 2021-03-07 (×5): qty 1

## 2021-03-07 MED ORDER — LORAZEPAM 2 MG/ML IJ SOLN
1.0000 mg | INTRAMUSCULAR | Status: AC | PRN
  Administered 2021-03-07 (×2): 2 mg via INTRAVENOUS
  Filled 2021-03-07 (×2): qty 1

## 2021-03-07 MED ORDER — ORAL CARE MOUTH RINSE
15.0000 mL | Freq: Two times a day (BID) | OROMUCOSAL | Status: DC
  Administered 2021-03-07 – 2021-03-11 (×8): 15 mL via OROMUCOSAL

## 2021-03-07 MED ORDER — PROSOURCE PLUS PO LIQD
30.0000 mL | Freq: Three times a day (TID) | ORAL | Status: DC
  Administered 2021-03-07 – 2021-03-11 (×8): 30 mL via ORAL
  Filled 2021-03-07 (×7): qty 30

## 2021-03-07 MED ORDER — THIAMINE HCL 100 MG PO TABS
100.0000 mg | ORAL_TABLET | Freq: Every day | ORAL | Status: DC
  Administered 2021-03-09 – 2021-03-11 (×3): 100 mg via ORAL
  Filled 2021-03-07 (×4): qty 1

## 2021-03-07 NOTE — TOC Initial Note (Signed)
Transition of Care [facility]) - Initial/Assessment Note    Patient Details  Name: [name] MRN: [id number] Date of Birth: 1945/12/02  Transition of Care [facility]) CM/SW Contact:    Leeroy Cha, RN Phone Number: 03/07/2021, 8:16 AM  Clinical Narrative:                 75 y.o. male HTN, HLD, CAD/MI s/p stents, A. fib, AAA, pancreatic mass, CKD, GERD, Barrett's esophagus, lung cancer s/p resection, BPH.   Patient presented to the ED today with complaint of BRBPR. Patient was seen by his PCP yesterday 9/19 for follow-up of malnutrition and unintentional weight loss of about 40 pounds in 1 year. Evidently he was started on Xarelto yesterday for A. fib.  He took the first dose of medicine yesterday at about 11 AM. This morning, patient woke up with abdominal discomfort and also noticed blood in his urine and stool.  Blood loss progressed over the day without nausea or vomiting.  EMS noted blood pressure was low in 60s in the field and started on IV fluid and brought to the ED.   In the ED, patient was tachycardic over 100, blood pressure was low in the 80s which improved to 90s with IV fluid. Labs with a hemoglobin low at 8.4 compared to 11.9 yesterday. He was given a unit of PRBC.  He continued to have BRBPR in the ED. CTA suggested an active bleeding from the duodenal bulb GI consultation was obtained and he was taken for urgent endoscopy. He was noted to have active bleeding in the duodenal area, hemostasis achieved with intervention. Critical care consultation was obtained.   Patient remained hemodynamically stable.  Hospital service was consulted for inpatient admission and management TOC PLAN OF CARE: Following for progression and toc needs.  Substance abuse information given to patient. Expected Discharge Plan: Home/Self Care Barriers to Discharge: Continued Medical Work up   Patient Goals and CMS Choice Patient states their goals for this hospitalization and ongoing recovery  are:: to go home CMS Medicare.gov Compare Post Acute Care list provided to:: Patient Choice offered to / list presented to : Patient  Expected Discharge Plan and Services Expected Discharge Plan: Home/Self Care   Discharge Planning Services: CM Consult   Living arrangements for the past 2 months: Single Family Home                                      Prior Living Arrangements/Services Living arrangements for the past 2 months: Single Family Home Lives with:: Self Patient language and need for interpreter reviewed:: Yes Do you feel safe going back to the place where you live?: Yes            Criminal Activity/Legal Involvement Pertinent to Current Situation/Hospitalization: No - Comment as needed  Activities of Daily Living      Permission Sought/Granted                  Emotional Assessment Appearance:: Appears stated age     Orientation: : Oriented to Self, Oriented to Place, Oriented to  Time, Oriented to Situation Alcohol / Substance Use: Alcohol Use, Illicit Drugs Psych Involvement: No (comment)  Admission diagnosis:  Hypovolemic shock ([place]) [R57.1] Acute GI bleeding [K92.2] Upper GI bleed [K92.2] Patient Active Problem List   Diagnosis Date Noted   Acute GI bleeding 03/06/2021   Upper GI bleed  Anticoagulated    Symptomatic anemia    Hypovolemic shock ([facility])    Dieulafoy lesion of duodenum    Atrial fibrillation ([place]) 03/05/2021   Pancreatic mass 11/08/2020   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   Protein-calorie malnutrition, severe 11/07/2020   Urinary retention 04/02/2018   Mild alcohol use disorder 12/09/2016   Premature atrial contractions 07/31/2015   Chronic use of opiate for therapeutic purpose 05/01/2015   UTI (urinary tract infection) 03/02/2015   Atherosclerosis of aorta ([place]) 01/13/2015   Prediabetes 12/30/2014   AAA (abdominal aortic aneurysm) ([place])    Healthcare maintenance 06/30/2012   CAD (coronary  artery disease) 03/17/2012   Erectile dysfunction 10/12/2010   Arthropathy of pelvic region and thigh 05/02/2008   CKD (chronic kidney disease) stage 2, GFR 60-89 ml/min 08/07/2007   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

## 2021-03-07 NOTE — Anesthesia Postprocedure Evaluation (Signed)
Anesthesia Post Note  Patient: [name]  Procedure(s) Performed: ESOPHAGOGASTRODUODENOSCOPY (EGD) WITH PROPOFOL HOT HEMOSTASIS (ARGON PLASMA COAGULATION/BICAP) HEMOSTASIS CLIP PLACEMENT     Patient location during evaluation: PACU Anesthesia Type: General Post-procedure mental status: Drowsy, but easily arousable. Pain management: pain level controlled Vital Signs Assessment: post-procedure vital signs reviewed and stable Respiratory status: spontaneous breathing, nonlabored ventilation, respiratory function stable and patient connected to nasal cannula oxygen Cardiovascular status: blood pressure returned to baseline and stable Postop Assessment: no apparent nausea or vomiting Anesthetic complications: no   No notable events documented.  Last Vitals:  Vitals:   03/07/21 0600 03/07/21 0630  BP: (!) 105/54   Pulse: 90   Resp:    Temp:  (!) 36.4 C  SpO2: 98%     Last Pain:  Vitals:   03/07/21 0630  TempSrc: Axillary  PainSc:                  Audry Pili

## 2021-03-07 NOTE — Progress Notes (Signed)
At 2:15 patient was screaming, restless and hallucinating calling his Mother. Mentation also changed from oriented to confused. Patient said he had "a lot of liquor" prior to admission. Informed Olena Heckle NP and ordered ETOH withdrawal protocol. CIWA score=14, gave Ativan 2mg  IV and patient's calmed down & sleep but BP drop to 84/42. Will continue to monitor patient.

## 2021-03-07 NOTE — Progress Notes (Signed)
PROGRESS NOTE  [name]  DOB: 1945-08-08  PCP: Axel Filler, MD WUJ:[id number]  DOA: 03/06/2021  LOS: 1 day  Hospital Day: 2   Chief Complaint  Patient presents with   GI Bleeding    Brief narrative: [name] is a 75 y.o. male with PMH significant for HTN, HLD, CAD/MI s/p stents, A. fib, AAA, pancreatic mass, CKD, GERD, Barrett's esophagus, lung cancer s/p resection, BPH.   Patient presented to the ED on 9/20 with complaint of BRBPR. Patient was seen by his PCP on 9/19 for follow-up of malnutrition and unintentional weight loss of about 40 pounds in 1 year. Evidently he was started on Xarelto on 9/19 for A. fib.  He took the first dose of medicine the same day at about 11 AM. Next morning, patient woke up with abdominal discomfort and also noticed blood in his urine and stool.  Blood loss progressed over the day without nausea or vomiting.  EMS noted blood pressure was low in 60s in the field and started on IV fluid and brought to the ED.   In the ED, patient was tachycardic over 100, blood pressure was low in the 80s which improved to 90s with IV fluid. Labs with a hemoglobin low at 8.4 compared to 11.9 yesterday. He was given a unit of PRBC.  He continued to have BRBPR in the ED. CTA suggested an active bleeding from the duodenal bulb GI consultation was obtained and he was taken for urgent endoscopy. Admitted to hospitalist service. See below for details.  Subjective: Patient was seen and examined this morning.  Pleasant elderly African-American male.  Lying down in bed.  Open eyes on verbal command.  Fell right back to sleep.  Per RN, patient was agitated and restless last night.  He admitted to using hard liquor.  Assessment/Plan: Acute GI bleeding -Apparently after taking 1 dose of Xarelto. -Presented with multiple episodes of BRBPR -Underwent emergent EGD on 9/20. Per EGD note, patient had old blood/clot in the entire stomach.  He was noted to have  an actively bleeding dieulfoy lesion in the duodenal bulb which was treated with APC and hemostasis clips with good result.  He was also noted to have abnormal appearing ampulla concerning for adenomatous change but biopsy was not taken because of risk of rebleeding. -Patient was started on IV Protonix twice daily. -Xarelto obviously on hold.   Acute blood loss anemia Macrocytic anemia of chronic disease -Hemoglobin dropped from 11.9-8.4 and 24 hours due to acute severe duodenal bleeding.  1 unit of PRBC was transfused in the ED. -Continue to monitor for hemoglobin trend. Recent Labs    11/06/20 1844 11/07/20 0618 03/06/21 1211 03/06/21 1226 03/06/21 1700 03/07/21 0236 03/07/21 1238  HGB  --    < > 8.4* 8.8* 6.8* 7.7* 7.5*  MCV  --    < > 99.3  --   --  92.8  --   VITAMINB12 958*  --   --   --   --   --   --   FOLATE 32.9  --   --   --   --   --   --   FERRITIN 489*  --   --   --   --   --   --   TIBC 140*  --   --   --   --   --   --   IRON 38*  --   --   --   --   --   --  RETICCTPCT 1.2  --   --   --   --   --   --    < > = values in this interval not displayed.   Hematuria BPH/chronic Foley -Has a chronic Foley in place because of bladder obstruction related to BPH  -On admission patient had hematuria. -Urinalysis with turbid yellow urine with 2+ leukocytes, many bacteria, crystals in yeast. -Hematuria is gradually clearing up. -Continue Flomax  Alcohol withdrawal symptoms -Patient was having agitation, restlessness tachycardia last night.  He admitted to using hard liquor daily.  He was placed on CIWA protocol with Ativan as needed.  Continue to monitor. -Counseled to quit alcohol.   Essential hypertension -Not on BP meds at home. Continue to monitor.   CAD/MI s/p stents HLD, AAA -Continue Crestor.  Prior to yesterday, patient was on baby aspirin which is switched to Xarelto.  Currently Xarelto on hold.   A. Fib -Xarelto on hold   CKD 2 -Creatinine at  baseline. Recent Labs    11/08/20 0408 11/09/20 0316 11/10/20 0174 11/11/20 0231 11/12/20 0711 11/13/20 0136 03/05/21 1157 03/06/21 1211 03/06/21 1226 03/07/21 0236  BUN 16 13 11 9  7* 8 19 52* 58* 52*  CREATININE 1.20 1.12 1.09 1.10 1.25* 1.28* 1.18 1.42* 1.30* 1.17   History of pancreatic abnormality -he has a history of pancreatic abnormality which has been followed with MRCP most recently (May 2022 - mass not confidently identified)   lung cancer s/p resection, -Respiratory status stable   Mobility: Encourage ambulation when mental status improves Code Status:   Code Status: Full Code  Nutritional status: Body mass index is 17.42 kg/m.     Diet:  Diet Order             Diet clear liquid Room service appropriate? Yes; Fluid consistency: Thin  Diet effective now                  DVT prophylaxis:  Place TED hose Start: 03/06/21 1708   Antimicrobials: None Fluid: Continuing normal saline at 75 mill per hour Consultants: GI Family Communication: None at bedside  Status is: Inpatient  Remains inpatient appropriate because: Watch for recurrence of GI bleeding.  Was for alcohol withdrawal symptoms   Dispo: The patient is from: Home              Anticipated d/c is to: Home in 1 to 2 days              Patient currently is not medically stable to d/c.   Difficult to place patient No     Infusions:   sodium chloride 75 mL/hr at 03/07/21 0645    Scheduled Meds:  Chlorhexidine Gluconate Cloth  6 each Topical Daily   folic acid  1 mg Oral Daily   mouth rinse  15 mL Mouth Rinse BID   multivitamin with minerals  1 tablet Oral Daily   pantoprazole (PROTONIX) IV  40 mg Intravenous Q12H   rosuvastatin  20 mg Oral Daily   tamsulosin  0.8 mg Oral QPM   thiamine  100 mg Oral Daily   Or   thiamine  100 mg Intravenous Daily    Antimicrobials: Anti-infectives (From admission, onward)    None       PRN meds: acetaminophen **OR** acetaminophen,  hydrALAZINE, levalbuterol, LORazepam **OR** LORazepam, morphine injection   Objective: Vitals:   03/07/21 0630 03/07/21 0800  BP:    Pulse:    Resp:  (!) 21  Temp: (!)  97.5 F (36.4 C)   SpO2:      Intake/Output Summary (Last 24 hours) at 03/07/2021 1348 Last data filed at 03/07/2021 0645 Gross per 24 hour  Intake 3457.04 ml  Output 1290 ml  Net 2167.04 ml   Filed Weights   03/06/21 1145 03/06/21 1522 03/06/21 1818  Weight: 59.4 kg 59.4 kg 59.9 kg   Weight change:  Body mass index is 17.42 kg/m.   Physical Exam: General exam: Pleasant, elderly African-American male.  Partially sedated Skin: No rashes, lesions or ulcers. HEENT: Atraumatic, normocephalic, no obvious bleeding Lungs: Clear to auscultation bilaterally CVS: Regular rate and rhythm, no murmur GI/Abd soft, mild generalized tenderness, nondistended, bowel sound present CNS: Partially sedated, opens eyes on verbal command, falls right back to sleep. Psychiatry: Mood appropriate Extremities: No pedal edema, no calf tenderness  Data Review: I have personally reviewed the laboratory data and studies available.  Recent Labs  Lab 03/05/21 1157 03/06/21 1211 03/06/21 1226 03/06/21 1700 03/07/21 0236 03/07/21 1238  WBC 4.8 7.5  --   --  7.3  --   NEUTROABS  --  6.5  --   --   --   --   HGB 11.9* 8.4* 8.8* 6.8* 7.7* 7.5*  HCT 35.8* 26.6* 26.0* 21.6* 23.3* 22.8*  MCV 93 99.3  --   --  92.8  --   PLT 131* 112*  --   --  82*  --    Recent Labs  Lab 03/05/21 1157 03/06/21 1211 03/06/21 1226 03/07/21 0236  NA 140 145 142 141  K 4.3 5.1 4.9 4.1  CL 102 111 108 112*  CO2 25 25  --  23  GLUCOSE 98 110* 106* 91  BUN 19 52* 58* 52*  CREATININE 1.18 1.42* 1.30* 1.17  CALCIUM 10.0 9.3  --  8.3*  MG 2.0  --   --   --   PHOS 3.2  --   --   --     F/u labs ordered Unresulted Labs (From admission, onward)     Start     Ordered   03/07/21 9326  Basic metabolic panel  Daily,   R      03/06/21 1709   03/07/21  0500  CBC  Daily,   R      03/06/21 1709            Signed, Terrilee Croak, MD [facility] 03/07/2021

## 2021-03-07 NOTE — Progress Notes (Signed)
Pt came in with foley. Per family, foley had been in place for over 2 months. Urine was red and had sediment yesterday and today the urine was brown and bile. The urine was very foul. Contacted MD. Per MD replace foley and collect UA and UC. Foley has been replaced and UA and UC has been collected. Patient continues to pass foul urine.

## 2021-03-07 NOTE — Progress Notes (Signed)
Initial Nutrition Assessment  DOCUMENTATION CODES:   Severe malnutrition in context of chronic illness, Underweight  INTERVENTION:  - will order Boost Breeze TID, each supplement provides 250 kcal and 9 grams of protein. - will order 30 ml Prosource Plus TID, each supplement provides 100 kcal and 15 grams protein.  - diet advancement as medically feasible.    NUTRITION DIAGNOSIS:   Severe Malnutrition related to chronic illness, cancer and cancer related treatments as evidenced by severe fat depletion, severe muscle depletion.  GOAL:   Patient will meet greater than or equal to 90% of their needs  MONITOR:   PO intake, Supplement acceptance, Diet advancement, Labs, Weight trends  REASON FOR ASSESSMENT:   Malnutrition Screening Tool  ASSESSMENT:   75 y.o. male with medical history of HTN, HLD, CAD/MI s/p stents, A. fib, AAA, pancreatic mass, CKD, GERD, Barrett's esophagus, lung cancer s/p resection, and BPH. He presented to the ED on 9/20 with complaint of BRBPR. Patient was seen by his PCP on 9/19 for follow-up of malnutrition and unintentional weight loss of ~40 pounds x1 year. CTA indicated active bleeding from the duodenal bulb and GI took patient for urgent endoscopy.  Patient is noted to be disoriented x4. Able to talk with RN prior to entering patient's room. She reports that patient recently finished lunch meal and that he was able to feed himself and consumed the majority of what was on tray.   Brother and sister at bedside but do not engage with RD even after introduction of self. RN reported that patient was on bedpan. Lower body not assessed at this time d/t this. Patient acknowledges RD but does not vocalize in any way.  Weight yesterday was 132 lb and weight on 11/27/20 was 141 lb. This indicates 9 lb weight loss (6.4% body weight) in the past 3 months; not significant for time frame. Weight on 05/15/20 was 157 lb. This indicates 25 lb weight loss (16% body weight) in  the past 10 months; not significant for time frame.   Per notes: - acute GIB s/p EGD on 9/20 with noted old blood in entire stomach and active bleeding in duodenal bulb - ABLA on top of macrocytic anemia of chronic disease - alcohol withdrawal  - hx of lung cancer s/p resection   Labs reviewed; Cl: 112 mmol/l, BUN: 52 mg/dl, Ca: 8.3 mg/dl. Medications reviewed; 1 mg folvite/day, 1 tablet multivitamin with minerals/day, 40 mg IV protonix BID, 100 mg thiamine/day.  IVF; NS @ 75 ml/hr.    NUTRITION - FOCUSED PHYSICAL EXAM:  Flowsheet Row Most Recent Value  Orbital Region Severe depletion  Upper Arm Region Moderate depletion  Thoracic and Lumbar Region Unable to assess  Buccal Region Severe depletion  Temple Region Moderate depletion  Clavicle Bone Region Severe depletion  Clavicle and Acromion Bone Region Severe depletion  Scapular Bone Region Severe depletion  Dorsal Hand Unable to assess  Patellar Region Unable to assess  Anterior Thigh Region Unable to assess  Posterior Calf Region Unable to assess  Edema (RD Assessment) Unable to assess  Hair Reviewed  Eyes Reviewed  Mouth Unable to assess  Skin Reviewed  Nails Unable to assess       Diet Order:   Diet Order             Diet clear liquid Room service appropriate? Yes; Fluid consistency: Thin  Diet effective now  EDUCATION NEEDS:   No education needs have been identified at this time  Skin:  Skin Assessment: Reviewed RN Assessment  Last BM:  PTA/unknown  Height:   Ht Readings from Last 1 Encounters:  03/06/21 6\' 1"  (1.854 m)    Weight:   Wt Readings from Last 1 Encounters:  03/06/21 59.9 kg      Estimated Nutritional Needs:  Kcal:  1800-2000 kcal Protein:  90-105 grams Fluid:  >/= 2.3 L/day     Jarome Matin, MS, RD, LDN, CNSC Inpatient Clinical Dietitian RD pager # available in [facility]  After hours/weekend pager # available in [facility]

## 2021-03-07 NOTE — Progress Notes (Signed)
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  Hemoglobin stable overnight.  Today, patient is found sedated.  Per nursing staff he has had no further bowel movements overnight and his hemoglobin has remained stable, in fact they have not even given him the next unit of PRBCs because his hemoglobin was above 7.  Patient has been going through alcohol withdrawal and is on CIWA protocol.   Objective   Vital signs in last 24 hours: Temp:  [97.5 F (36.4 C)-99.1 F (37.3 C)] 97.5 F (36.4 C) (09/21 0630) Pulse Rate:  [83-108] 90 (09/21 0600) Resp:  [15-34] 21 (09/21 0800) BP: (84-143)/(42-81) 105/54 (09/21 0600) SpO2:  [92 %-100 %] 98 % (09/21 0600) Weight:  [59.4 kg-59.9 kg] 59.9 kg (09/20 1818) Last BM Date: 03/06/21 General:   Thin, cachectic, chronically ill-appearing, African-American male in NAD Heart:  Regular rate and rhythm; no murmurs Lungs: Respirations even and unlabored, lungs CTA bilaterally Abdomen:  Soft, nontender and nondistended. Normal bowel sounds. Psych: Sedated  Intake/Output from previous day: 09/20 0701 - 09/21 0700 In: 3457 [I.V.:1592.3; Blood:864.7; IV Piggyback:1000] Out: 0814 [Urine:1290]   Lab Results: Recent Labs    03/05/21 1157 03/06/21 1211 03/06/21 1211 03/06/21 1226 03/06/21 1700 03/07/21 0236  WBC 4.8 7.5  --   --   --  7.3  HGB 11.9* 8.4*   < > 8.8* 6.8* 7.7*  HCT 35.8* 26.6*   < > 26.0* 21.6* 23.3*  PLT 131* 112*  --   --   --  82*   < > = values in this interval not displayed.   BMET Recent Labs    03/05/21 1157 03/06/21 1211 03/06/21 1226 03/07/21 0236  NA 140 145 142 141  K 4.3 5.1 4.9 4.1  CL 102 111 108 112*  CO2 25 25  --  23  GLUCOSE 98 110* 106* 91  BUN 19 52* 58* 52*  CREATININE 1.18 1.42* 1.30* 1.17  CALCIUM 10.0 9.3  --  8.3*   LFT Recent Labs    03/06/21 1211  PROT  6.1*  ALBUMIN 2.9*  AST 30  ALT 13  ALKPHOS 52  BILITOT 0.5    Studies/Results: CT Angio Abd/Pel W and/or Wo Contrast  Result Date: 03/06/2021 CLINICAL DATA:  75 year old with abdominal pain, aortic dissection suspected. GI bleed and hematuria. Rule out abdominal aortic aneurysm. EXAM: CTA ABDOMEN AND PELVIS WITHOUT AND WITH CONTRAST TECHNIQUE: Multidetector CT imaging of the abdomen and pelvis was performed using the standard protocol during bolus administration of intravenous contrast. Multiplanar reconstructed images and MIPs were obtained and reviewed to evaluate the vascular anatomy. CONTRAST:  121mL OMNIPAQUE IOHEXOL 350 MG/ML SOLN COMPARISON:  11/07/2020 FINDINGS: VASCULAR Aorta: Infrarenal abdominal aortic aneurysm measures up to 5.6 cm and previously measured 5.3 cm on 11/07/2020. Again noted is a large amount of mural thrombus within the abdominal aortic aneurysm. Mural thrombus within the aneurysm may have slightly decreased in the interim. Diffuse atherosclerotic disease and irregularity in the distal descending thoracic aorta. Distal descending thoracic aorta measures 3.7 cm and stable. No evidence for an aortic dissection. Small focal outpouchings in the distal descending thoracic aorta could be related to small penetrating ulcers. Most prominent outpouching is near the aortic hiatus and this measures approximately 0.7 cm in the AP dimension and similar to the previous examination. Aorta at the level of celiac  artery measures 3.1 cm. Juxtarenal abdominal aorta measures approximately 3.0 cm in the transverse dimension. Celiac: Patent without evidence of aneurysm, dissection, vasculitis or significant stenosis. SMA: Acute angulation of the proximal SMA. There is mild narrowing at this area of angulation. Renals: Both renal arteries are patent without evidence of aneurysm, dissection, vasculitis, fibromuscular dysplasia or significant stenosis. Small accessory left renal artery. IMA: Origin  appears to be occluded due to the aortic aneurysm and mural thrombus. 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 Right internal iliac artery is patent but there is significant disease near the origin with poorly defined saccular aneurysm near the origin and proximal aspect of the right internal iliac artery measuring 8-9 mm. Right external iliac artery is tortuous but patent without significant stenosis. Left common iliac artery measures up to 1.3 cm. Left external iliac artery is tortuous but patent without significant stenosis. Left internal iliac artery is patent. Proximal Outflow: Proximal femoral arteries are patent bilaterally. Veins: Hepatic veins and main portal venous system are patent. Portal vein thrombus has resolved. Review of the MIP images confirms the above findings. NON-VASCULAR Lower chest: Lung bases are clear. Hepatobiliary: Calcified gallstones. Gallbladder appears to be decompressed. No focal liver lesion. No significant biliary dilatation. Pancreas: Again noted are calcifications at the pancreatic head. Poorly defined hypodensities at the pancreatic head are similar to the previous examination. No significant pancreatic duct dilatation or inflammation. Spleen: Normal in size without focal abnormality. Adrenals/Urinary Tract: Normal adrenal glands. Bilateral renal cysts. There is slightly hyperdense structure along the posterior left kidney on sequence 12, image 27. No enhancement within this hyperdense structure and probably represents a proteinaceous or hemorrhagic cyst. Vascular calcifications in the kidneys. There may be a tiny nonobstructive left kidney stone. Foley catheter is present. There is high-density material within the bladder which could represent wall calcifications or urinary calculi. Bladder is mildly distended despite having a Foley catheter. Tip of the Foley catheter is protruding along the anterior aspect of the bladder and probably within  a diverticulum. Stomach/Bowel: Evidence for contrast extravasation in the proximal duodenum on sequence 5 image 58 and sequence 12, image 27. This is compatible with active GI bleeding in the proximal duodenum. Tubular type structure in the duodenum on sequence 5 image 31 is nonspecific. High-density material in the right colon and cecum is seen on the pre contrast images. There is no evidence of acute bleeding in the colon. No evidence for a bowel obstruction. Lymphatic: No abdominopelvic lymph node enlargement. Reproductive: Prostate is enlarged and there is a Foley catheter extending into the urinary bladder. Other: Negative for ascites.  Negative for free air. Musculoskeletal: Multilevel degenerative changes in the lumbar spine. IMPRESSION: VASCULAR 1. Active GI bleeding with contrast extravasation in the proximal duodenum. Etiology for the GI bleeding is uncertain. 2. Abdominal aortic aneurysm measuring up to 5.6 cm. Aneurysm has enlarged since May 2022 when it measured 5.3 cm. Recommend referral to a vascular specialist. This recommendation follows ACR consensus guidelines: White Paper of the ACR Incidental Findings Committee II on Vascular Findings. J Am Coll Radiol 2013; 10:789-794. 3. Portal venous thrombus has resolved. 4. 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 5. 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 6. 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 NON-VASCULAR 1. Foley catheter is  present but the tip is protruding anterior to the bladder. There is wall thickening in this area and suspect this is chronic and may be associated with a bladder diverticulum. Reportedly, the patient has hematuria and the position of the Foley catheter may be contributing to the hematuria. 2. Possible bladder  calculi. 3. Again noted is heterogeneity at the pancreatic head with some calcifications and low-density areas. Questionable diverticulum or tubular structure in the descending duodenum poorly characterized. Patient had a MRI of the abdomen to evaluate the pancreas in the past and recommend referral to that report for follow-up recommendations. 4. Cholelithiasis.  The gallbladder appears to be contracted. 5. Bilateral renal cysts. Hyperdense cyst in left kidney upper pole is most compatible with a proteinaceous or hemorrhagic cyst. These results were called by telephone at the time of interpretation on 03/06/2021 at 1:52 pm to provider DAN FLOYD , who verbally acknowledged these results. Electronically Signed   By: Markus Daft M.D.   On: 03/06/2021 14:35       Assessment / Plan:   Assessment: 1.  Upper GI bleed: Presented to the hospital 03/06/2021 with multiple episodes of melenic stool and continuing to have melena after taking 1 dose of Xarelto on 9/19, hemoglobin was down to 8.8 (11.9 on 9/19)--> 6.8--> 2 units PRBCs--> 7.7 this morning, EGD 9/20 with treatment of an actively bleeding dieulafoy lesion in the duodenum, no further bleeding overnight 2.  Acute blood loss anemia 3.  Unexplained weight loss: Suspicion from PCP that some of this is related to alcohol abuse given that patient is now in alcohol withdrawal, previously EGD and colonoscopy without etiology 4.  A. fib: Large GI bleed after 1 dose of Xarelto, would recommend discontinuing likely permanently 5.  Known aortic aneurysm 6.  Alcohol withdrawal  Plan: 1.  Will discontinue orders for last unit of PRBCs given that patient's hemoglobin is stabilized overnight and EGD was successful yesterday. 2.  Continue to monitor hemoglobin every 8 hours with transfusion as needed less than 7 3.  Agree with CIWA protocol 4.  We will switch patient to Protonix 40 mg IV twice daily 5.  Patient can be on clear liquid diet today when awake enough to  swallow 6.  Patient should be counseled on alcohol cessation 7.  Again patient may continue to pass some old blood in next bowel movement for the next 24 to 48 hours, if further fresh blood please let us know  We will continue to follow.   LOS: 1 day   Levin Erp  03/07/2021, 10:01 AM

## 2021-03-07 NOTE — Plan of Care (Signed)
  Problem: Nutrition: Goal: Adequate nutrition will be maintained Outcome: Progressing   Problem: Elimination: Goal: Will not experience complications related to urinary retention Outcome: Progressing   

## 2021-03-07 NOTE — Progress Notes (Signed)
   I visited [name] at [facility] stepdown unit this morning. He is a 75 year old person who I have been managing for about 6 years and historically has been fairly healthy with good functional status, prior issues were osteoarthritis and negative social determinants of health. The last six months have been very difficult for him, we have been managing multiple issues at once, including unintentional weightloss, a growing AAA, and urine retention. 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 He had a recent diagnosis of paroxysmal atrial fibrillation seen on two day ambulatory monitor in June. In our visit on Monday he showed some stabilization, mostly concerned about the chronic urethral catheter, no issues with abdominal pain or stool changes. His ChadsVasc score was elevated at 4, with a 4% annual risk of stroke, we talked about anticoagulation as a way to lower that risk. He had showed stability since the last hospitalization, so we decided to try anticoagulation. I did recognize he had a higher than average risk of bleeding, Has-bled score of 2 for age and alcohol use, but I was reassured by the recent endoscopy. We chose to use a low dose of xarelto and stop the aspirin. His labs on Monday showed some signs of improvement, with better renal function, protein markers, and resolving anemia. The fact that he had such a quick bleed from a previously unknown Dieulafoy lesion highlights his degree of fragility and chronic illness.   On exam this morning he appears sedated and delirious. I have known he had at risk alcohol use, but he was never open with me about the extent of his alcohol use previously. He appears to have alcohol withdrawal at this time being managed with benzos and ciwa protocol. I think alcohol use disorder is likely playing a larger role in his weightloss and declining functional status than I previously assessed.   I greatly appreciate the management of Dr.  Havery Moros and the [facility] hospitalist service. I hope with further supportive care the patient will be able to recover from this acute episode and be discharged in the coming days. Would recommend considering empiric antibiotics as the patient is at risk for invasive infections from his indwelling catheter, this large GI bleed event, and has a history of bacteremia in May. He was on macrobid for an enterococcus cystitis until Tuesday, I had a urine culture on Monday which is not yet resulted. I am available to help with any other follow up needs.   Axel Filler, MD 03/07/2021, 7:49 AM

## 2021-03-08 ENCOUNTER — Other Ambulatory Visit: Payer: Self-pay

## 2021-03-08 DIAGNOSIS — K3182 Dieulafoy lesion (hemorrhagic) of stomach and duodenum: Secondary | ICD-10-CM | POA: Diagnosis not present

## 2021-03-08 DIAGNOSIS — K922 Gastrointestinal hemorrhage, unspecified: Secondary | ICD-10-CM | POA: Diagnosis not present

## 2021-03-08 LAB — BASIC METABOLIC PANEL
Anion gap: 3 — ABNORMAL LOW (ref 5–15)
BUN: 42 mg/dL — ABNORMAL HIGH (ref 8–23)
CO2: 24 mmol/L (ref 22–32)
Calcium: 8.3 mg/dL — ABNORMAL LOW (ref 8.9–10.3)
Chloride: 112 mmol/L — ABNORMAL HIGH (ref 98–111)
Creatinine, Ser: 1.19 mg/dL (ref 0.61–1.24)
GFR, Estimated: >60 mL/min (ref >60)
Glucose, Bld: 94 mg/dL (ref 70–99)
Potassium: 3.5 mmol/L (ref 3.5–5.1)
Sodium: 139 mmol/L (ref 135–145)

## 2021-03-08 LAB — CBC
HCT: 19.3 % — ABNORMAL LOW (ref 39.0–52.0)
Hemoglobin: 6.4 g/dL — CL (ref 13.0–17.0)
MCH: 31.1 pg (ref 26.0–34.0)
MCHC: 33.2 g/dL (ref 30.0–36.0)
MCV: 93.7 fL (ref 80.0–100.0)
Platelets: 76 10*3/uL — ABNORMAL LOW (ref 150–400)
RBC: 2.06 MIL/uL — ABNORMAL LOW (ref 4.22–5.81)
RDW: 15.8 % — ABNORMAL HIGH (ref 11.5–15.5)
WBC: 5.7 10*3/uL (ref 4.0–10.5)
nRBC: 0 % (ref 0.0–0.2)

## 2021-03-08 LAB — HEMOGLOBIN AND HEMATOCRIT, BLOOD
HCT: 23.4 % — ABNORMAL LOW (ref 39.0–52.0)
Hemoglobin: 7.7 g/dL — ABNORMAL LOW (ref 13.0–17.0)

## 2021-03-08 MED ORDER — QUETIAPINE FUMARATE 50 MG PO TABS
25.0000 mg | ORAL_TABLET | Freq: Every day | ORAL | Status: DC
  Administered 2021-03-08: 25 mg via ORAL
  Filled 2021-03-08: qty 1

## 2021-03-08 MED ORDER — SODIUM CHLORIDE 0.9% IV SOLUTION: Freq: Once | INTRAVENOUS | Status: AC

## 2021-03-08 NOTE — Progress Notes (Signed)
Progress Note Hospital Day: 3  Chief Complaint:   gastrointestinal bleeding.       ASSESSMENT AND PLAN   Brief History:  75 yo male with multiple medical problems not limited to A-Fib on Xarelto , AAA, CAD, CKD, remote lung cancer, Barrett's esophagus ( remote, not on last few upper endoscopies). Admitted 9/20 with complaints of blood in urine / GI bleed / hypotension with acute on chronic anemia after starting Xarelto the day prior.   # Upper GI bleed. CTA suggested active bleeding in duodenal bulb. EGD yesterday showed actively bleeding dieulafoy lesion, s/p APC and clip placement with control of bleeding.   --PCCM following for hemodynamic instability - improved with IVF / PRBC.  --Spoke with RN. No GI bleeding during the night or this am . No blood in foley.   --Continue BID IV PPI --Tolerating clears, will advance to fulls and then advance further if no recurrent bleeding --Xarelto still on hold  # Abnormal mucosa ( adenomatous appearing) involving ampulla seen on EGD in May 2022 and again yesterday. Biopsies not performed in May due to challenging location and not performed yesterday in setting of active GI bleeding.    # Acute on chronic anemia in setting on GI bleed. Hgb 6.8, down from baseline of ~ 9. Received 2 uPRBC with minimal improvement in hgb. Received a 3rd unit this am for recurrent decline in hgb ( in absence of ongoing bleeding). Post transfusion hgb has improved from 6.4 to 7.7.     # History of pancreatic abnormality which has been followed with MRCP most recently (May 2022 - mass not confidently identified)   DIAGNOSTIC STUDIES THIS ADMISSION:   EGD 9/20 for GI bleed --Old blood and clots in entire stomach. Blood cleared, no active bleeding. Fresh blood in antrum which was lavaged. Red blood with clots in entire duodenum which was lavaged. Actively bleeding dieulafoy lesion in duodenal bulb s/p APC and clip placement with control of bleeding. The mucosa of  the ampulla appeared abnormal - diffusely appeared adenomatous. Biopsies not taken given the patient's recent severe  bleeding. There was a benign cystic lesion in the distal duodenal bulb. The exam of the duodenum was otherwise normal.   SUBJECTIVE   Feet are itching. Having intermittent upper abdominal pain. No further GI bleeding. Had juice for breakfast.      OBJECTIVE      Scheduled inpatient medications:   (feeding supplement) PROSource Plus  30 mL Oral TID BM   Chlorhexidine Gluconate Cloth  6 each Topical Daily   feeding supplement  1 Container Oral TID BM   folic acid  1 mg Oral Daily   mouth rinse  15 mL Mouth Rinse BID   multivitamin with minerals  1 tablet Oral Daily   pantoprazole (PROTONIX) IV  40 mg Intravenous Q12H   rosuvastatin  20 mg Oral Daily   tamsulosin  0.8 mg Oral QPM   thiamine  100 mg Oral Daily   Or   thiamine  100 mg Intravenous Daily   Continuous inpatient infusions:   sodium chloride 75 mL/hr at 03/07/21 1840   PRN inpatient medications: acetaminophen **OR** acetaminophen, hydrALAZINE, levalbuterol, LORazepam **OR** LORazepam, morphine injection  Vital signs in last 24 hours: Temp:  [96.8 F (36 C)-98.9 F (37.2 C)] 98 F (36.7 C) (09/22 0800) Pulse Rate:  [36-104] 85 (09/22 0800) Resp:  [15-27] 23 (09/22 0800) BP: (96-170)/(34-92) 149/78 (09/22 0800) SpO2:  [95 %-100 %] 100 % (09/22  0800) Last BM Date: 03/07/21  Intake/Output Summary (Last 24 hours) at 03/08/2021 0931 Last data filed at 03/08/2021 2130 Gross per 24 hour  Intake 1853.4 ml  Output 650 ml  Net 1203.4 ml     Physical Exam:  General: Alert thin male in NAD Heart:  Regular rate , A-fib on monitor. No lower extremity edema Pulmonary: Normal respiratory effort. Lung CTA.  Abdomen: Soft, nondistended, mild LUQ tenderness. Normal bowel sounds.  Neurologic: Alert and oriented Psych: Pleasant. Cooperative.   Filed Weights   03/06/21 1145 03/06/21 1522 03/06/21 1818   Weight: 59.4 kg 59.4 kg 59.9 kg    Intake/Output from previous day: 09/21 0701 - 09/22 0700 In: 2044.6 [I.V.:1686.1; Blood:358.5] Out: 650 [Urine:650] Intake/Output this shift: No intake/output data recorded.    Lab Results: Recent Labs    03/06/21 1211 03/06/21 1226 03/07/21 0236 03/07/21 1238 03/08/21 0236 03/08/21 0842  WBC 7.5  --  7.3  --  5.7  --   HGB 8.4*   < > 7.7* 7.5* 6.4* 7.7*  HCT 26.6*   < > 23.3* 22.8* 19.3* 23.4*  PLT 112*  --  82*  --  76*  --    < > = values in this interval not displayed.   BMET Recent Labs    03/06/21 1211 03/06/21 1226 03/07/21 0236 03/08/21 0236  NA 145 142 141 139  K 5.1 4.9 4.1 3.5  CL 111 108 112* 112*  CO2 25  --  23 24  GLUCOSE 110* 106* 91 94  BUN 52* 58* 52* 42*  CREATININE 1.42* 1.30* 1.17 1.19  CALCIUM 9.3  --  8.3* 8.3*   LFT Recent Labs    03/06/21 1211  PROT 6.1*  ALBUMIN 2.9*  AST 30  ALT 13  ALKPHOS 52  BILITOT 0.5   PT/INR No results for input(s): LABPROT, INR in the last 72 hours. Hepatitis Panel No results for input(s): HEPBSAG, HCVAB, HEPAIGM, HEPBIGM in the last 72 hours.  CT Angio Abd/Pel W and/or Wo Contrast  Result Date: 03/06/2021 CLINICAL DATA:  75 year old with abdominal pain, aortic dissection suspected. GI bleed and hematuria. Rule out abdominal aortic aneurysm. EXAM: CTA ABDOMEN AND PELVIS WITHOUT AND WITH CONTRAST TECHNIQUE: Multidetector CT imaging of the abdomen and pelvis was performed using the standard protocol during bolus administration of intravenous contrast. Multiplanar reconstructed images and MIPs were obtained and reviewed to evaluate the vascular anatomy. CONTRAST:  178mL OMNIPAQUE IOHEXOL 350 MG/ML SOLN COMPARISON:  11/07/2020 FINDINGS: VASCULAR Aorta: Infrarenal abdominal aortic aneurysm measures up to 5.6 cm and previously measured 5.3 cm on 11/07/2020. Again noted is a large amount of mural thrombus within the abdominal aortic aneurysm. Mural thrombus within the  aneurysm may have slightly decreased in the interim. Diffuse atherosclerotic disease and irregularity in the distal descending thoracic aorta. Distal descending thoracic aorta measures 3.7 cm and stable. No evidence for an aortic dissection. Small focal outpouchings in the distal descending thoracic aorta could be related to small penetrating ulcers. Most prominent outpouching is near the aortic hiatus and this measures approximately 0.7 cm in the AP dimension and similar to the previous examination. Aorta at the level of celiac artery measures 3.1 cm. Juxtarenal abdominal aorta measures approximately 3.0 cm in the transverse dimension. Celiac: Patent without evidence of aneurysm, dissection, vasculitis or significant stenosis. SMA: Acute angulation of the proximal SMA. There is mild narrowing at this area of angulation. Renals: Both renal arteries are patent without evidence of aneurysm, dissection, vasculitis, fibromuscular dysplasia  or significant stenosis. Small accessory left renal artery. IMA: Origin appears to be occluded due to the aortic aneurysm and mural thrombus. 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 Right internal iliac artery is patent but there is significant disease near the origin with poorly defined saccular aneurysm near the origin and proximal aspect of the right internal iliac artery measuring 8-9 mm. Right external iliac artery is tortuous but patent without significant stenosis. Left common iliac artery measures up to 1.3 cm. Left external iliac artery is tortuous but patent without significant stenosis. Left internal iliac artery is patent. Proximal Outflow: Proximal femoral arteries are patent bilaterally. Veins: Hepatic veins and main portal venous system are patent. Portal vein thrombus has resolved. Review of the MIP images confirms the above findings. NON-VASCULAR Lower chest: Lung bases are clear. Hepatobiliary: Calcified gallstones. Gallbladder  appears to be decompressed. No focal liver lesion. No significant biliary dilatation. Pancreas: Again noted are calcifications at the pancreatic head. Poorly defined hypodensities at the pancreatic head are similar to the previous examination. No significant pancreatic duct dilatation or inflammation. Spleen: Normal in size without focal abnormality. Adrenals/Urinary Tract: Normal adrenal glands. Bilateral renal cysts. There is slightly hyperdense structure along the posterior left kidney on sequence 12, image 27. No enhancement within this hyperdense structure and probably represents a proteinaceous or hemorrhagic cyst. Vascular calcifications in the kidneys. There may be a tiny nonobstructive left kidney stone. Foley catheter is present. There is high-density material within the bladder which could represent wall calcifications or urinary calculi. Bladder is mildly distended despite having a Foley catheter. Tip of the Foley catheter is protruding along the anterior aspect of the bladder and probably within a diverticulum. Stomach/Bowel: Evidence for contrast extravasation in the proximal duodenum on sequence 5 image 58 and sequence 12, image 27. This is compatible with active GI bleeding in the proximal duodenum. Tubular type structure in the duodenum on sequence 5 image 31 is nonspecific. High-density material in the right colon and cecum is seen on the pre contrast images. There is no evidence of acute bleeding in the colon. No evidence for a bowel obstruction. Lymphatic: No abdominopelvic lymph node enlargement. Reproductive: Prostate is enlarged and there is a Foley catheter extending into the urinary bladder. Other: Negative for ascites.  Negative for free air. Musculoskeletal: Multilevel degenerative changes in the lumbar spine. IMPRESSION: VASCULAR 1. Active GI bleeding with contrast extravasation in the proximal duodenum. Etiology for the GI bleeding is uncertain. 2. Abdominal aortic aneurysm measuring up  to 5.6 cm. Aneurysm has enlarged since May 2022 when it measured 5.3 cm. Recommend referral to a vascular specialist. This recommendation follows ACR consensus guidelines: White Paper of the ACR Incidental Findings Committee II on Vascular Findings. J Am Coll Radiol 2013; 10:789-794. 3. Portal venous thrombus has resolved. 4. 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 5. 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 6. 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 NON-VASCULAR 1. Foley catheter is present but the tip is protruding anterior to the bladder. There is wall thickening in this area and suspect this is chronic and may be associated with a bladder diverticulum. Reportedly, the patient has hematuria and the position of the Foley catheter may be contributing to the hematuria. 2. Possible bladder calculi. 3. Again noted is  heterogeneity at the pancreatic head with some calcifications and low-density areas. Questionable diverticulum or tubular structure in the descending duodenum poorly characterized. Patient had a MRI of the abdomen to evaluate the pancreas in the past and recommend referral to that report for follow-up recommendations. 4. Cholelithiasis.  The gallbladder appears to be contracted. 5. Bilateral renal cysts. Hyperdense cyst in left kidney upper pole is most compatible with a proteinaceous or hemorrhagic cyst. These results were called by telephone at the time of interpretation on 03/06/2021 at 1:52 pm to provider DAN FLOYD , who verbally acknowledged these results. Electronically Signed   By: Markus Daft M.D.   On: 03/06/2021 14:35        Active Problems:   Upper GI bleed   Anticoagulated   Symptomatic anemia   Hypovolemic shock (HCC)   Dieulafoy lesion of duodenum   Acute GI  bleeding     LOS: 2 days   Tye Savoy ,NP 03/08/2021, 9:31 AM

## 2021-03-08 NOTE — Progress Notes (Signed)
PROGRESS NOTE  [name]  DOB: 1945-10-03  PCP: Axel Filler, MD CHE:[id number]  DOA: 03/06/2021  LOS: 2 days  Hospital Day: 3   Chief Complaint  Patient presents with   GI Bleeding    Brief narrative: [name] is a 75 y.o. male with PMH significant for HTN, HLD, CAD/MI s/p stents, A. fib, AAA, pancreatic mass, CKD, GERD, Barrett's esophagus, lung cancer s/p resection, BPH.   Patient presented to the ED on 9/20 with complaint of BRBPR. Patient was seen by his PCP on 9/19 for follow-up of malnutrition and unintentional weight loss of about 40 pounds in 1 year. Evidently he was started on Xarelto on 9/19 for A. fib.  He took the first dose of medicine the same day at about 11 AM. Next morning, patient woke up with abdominal discomfort and also noticed blood in his urine and stool.  Blood loss progressed over the day without nausea or vomiting.  EMS noted blood pressure was low in 60s in the field and started on IV fluid and brought to the ED.   In the ED, patient was tachycardic over 100, blood pressure was low in the 80s which improved to 90s with IV fluid. Labs with a hemoglobin low at 8.4 compared to 11.9 yesterday. He was given a unit of PRBC.  He continued to have BRBPR in the ED. CTA suggested an active bleeding from the duodenal bulb GI consultation was obtained and he was taken for urgent endoscopy. Admitted to hospitalist service. See below for details.  Subjective: Patient was seen and examined this morning.   Drowsy, falling asleep in the middle of taking his breakfast.   Opens eyes and answers questions only.   Per nursing staff, he was restless last night and required few doses of Ativan and morphine.  Assessment/Plan: Acute GI bleeding -Apparently after taking 1 dose of Xarelto. -Presented with multiple episodes of BRBPR -Underwent emergent EGD on 9/20. Per EGD note, patient had old blood/clot in the entire stomach.  He was noted to have an  actively bleeding dieulfoy lesion in the duodenal bulb which was treated with APC and hemostasis clips with good result.  He was also noted to have abnormal appearing ampulla concerning for adenomatous change but biopsy was not taken because of risk of rebleeding. -Patient was started on IV Protonix twice daily. -Xarelto obviously on hold. -Bleeding seems to have stabilized.   Acute blood loss anemia Macrocytic anemia of chronic disease -Hemoglobin down again to 6.4 this morning.  3rd unit of PRBC transfused this morning. -Continue to monitor for hemoglobin trend. Recent Labs    11/06/20 1844 11/07/20 0618 03/06/21 1700 03/07/21 0236 03/07/21 1238 03/08/21 0236 03/08/21 0842  HGB  --    < > 6.8* 7.7* 7.5* 6.4* 7.7*  MCV  --    < >  --  92.8  --  93.7  --   VITAMINB12 958*  --   --   --   --   --   --   FOLATE 32.9  --   --   --   --   --   --   FERRITIN 489*  --   --   --   --   --   --   TIBC 140*  --   --   --   --   --   --   IRON 38*  --   --   --   --   --   --  RETICCTPCT 1.2  --   --   --   --   --   --    < > = values in this interval not displayed.    Hematuria BPH/chronic Foley -Has a chronic Foley in place because of bladder obstruction related to BPH  -On admission patient had hematuria. -Urinalysis with turbid yellow urine with 2+ leukocytes, many bacteria, crystals in yeast. -Hematuria is gradually clearing up.  However patient has foul-smelling urine.  Currently not on antibiotics.  No fever.  Urine culture report pending. -Continue Flomax  Alcohol withdrawal symptoms -Patient was having intermittent episodes of agitation and restlessness.  He admitted to using hard liquor daily.  He was placed on CIWA protocol with Ativan as needed.  I will start the patient on Seroquel 25 mg for tonight.   -Continue to monitor. -Counseled to quit alcohol.   Essential hypertension -Not on BP meds at home. Continue to monitor.   CAD/MI s/p stents HLD, AAA -Continue  Crestor.  Previously, patient was not aspirin.  On 9/19 as an outpatient, he was switched to Xarelto after which he started having GI bleeding.  Currently Xarelto on hold. A. Fib -Xarelto on hold   CKD 2 -Creatinine at baseline. Recent Labs    11/09/20 0316 11/10/20 0621 11/11/20 0231 11/12/20 0711 11/13/20 0136 03/05/21 1157 03/06/21 1211 03/06/21 1226 03/07/21 0236 03/08/21 0236  BUN 13 11 9  7* 8 19 52* 58* 52* 42*  CREATININE 1.12 1.09 1.10 1.25* 1.28* 1.18 1.42* 1.30* 1.17 1.19    History of pancreatic abnormality -he has a history of pancreatic abnormality which has been followed with MRCP most recently (May 2022 - mass not confidently identified)   lung cancer s/p resection, -Respiratory status stable   Mobility: Encourage ambulation when mental status improves Code Status:   Code Status: Full Code  Nutritional status: Body mass index is 17.42 kg/m. Nutrition Problem: Severe Malnutrition Etiology: chronic illness, cancer and cancer related treatments Signs/Symptoms: severe fat depletion, severe muscle depletion Diet:  Diet Order             Diet full liquid Room service appropriate? Yes; Fluid consistency: Thin  Diet effective now                  DVT prophylaxis:  Place TED hose Start: 03/06/21 1708   Antimicrobials: None Fluid: Continuing normal saline at 75 mill per hour Consultants: GI Family Communication: None at bedside  Status is: Inpatient  Remains inpatient appropriate because: Watch for recurrence of GI bleeding.  Was for alcohol withdrawal symptoms   Dispo: The patient is from: Home              Anticipated d/c is to: Home in 1 to 2 days              Patient currently is not medically stable to d/c.   Difficult to place patient No     Infusions:   sodium chloride 75 mL/hr at 03/07/21 1840    Scheduled Meds:  (feeding supplement) PROSource Plus  30 mL Oral TID BM   Chlorhexidine Gluconate Cloth  6 each Topical Daily   feeding  supplement  1 Container Oral TID BM   folic acid  1 mg Oral Daily   mouth rinse  15 mL Mouth Rinse BID   multivitamin with minerals  1 tablet Oral Daily   pantoprazole (PROTONIX) IV  40 mg Intravenous Q12H   rosuvastatin  20 mg Oral Daily  tamsulosin  0.8 mg Oral QPM   thiamine  100 mg Oral Daily   Or   thiamine  100 mg Intravenous Daily    Antimicrobials: Anti-infectives (From admission, onward)    None       PRN meds: acetaminophen **OR** acetaminophen, hydrALAZINE, levalbuterol, LORazepam **OR** LORazepam, morphine injection   Objective: Vitals:   03/08/21 1100 03/08/21 1200  BP: 115/67 137/73  Pulse: (!) 55 84  Resp: (!) 22 (!) 25  Temp:  (!) 97.3 F (36.3 C)  SpO2: 100% 100%    Intake/Output Summary (Last 24 hours) at 03/08/2021 1436 Last data filed at 03/08/2021 0900 Gross per 24 hour  Intake 1786.91 ml  Output 2150 ml  Net -363.09 ml    Filed Weights   03/06/21 1145 03/06/21 1522 03/06/21 1818  Weight: 59.4 kg 59.4 kg 59.9 kg   Weight change:  Body mass index is 17.42 kg/m.   Physical Exam: General exam: Pleasant, elderly African-American male.  Partially sedated.  Not in physical distress Skin: No rashes, lesions or ulcers. HEENT: Atraumatic, normocephalic, no obvious bleeding Lungs: Clear to auscultation bilaterally CVS: Regular rate and rhythm, no murmur GI/Abd soft, mild generalized tenderness, nondistended, bowel sound present CNS: Partially sedated, opens eyes on verbal command, falls right back to sleep. Psychiatry: Unable to examine because of mental status altered Extremities: No pedal edema, no calf tenderness  Data Review: I have personally reviewed the laboratory data and studies available.  Recent Labs  Lab 03/05/21 1157 03/06/21 1211 03/06/21 1226 03/06/21 1700 03/07/21 0236 03/07/21 1238 03/08/21 0236 03/08/21 0842  WBC 4.8 7.5  --   --  7.3  --  5.7  --   NEUTROABS  --  6.5  --   --   --   --   --   --   HGB 11.9* 8.4*    < > 6.8* 7.7* 7.5* 6.4* 7.7*  HCT 35.8* 26.6*   < > 21.6* 23.3* 22.8* 19.3* 23.4*  MCV 93 99.3  --   --  92.8  --  93.7  --   PLT 131* 112*  --   --  82*  --  76*  --    < > = values in this interval not displayed.    Recent Labs  Lab 03/05/21 1157 03/06/21 1211 03/06/21 1226 03/07/21 0236 03/08/21 0236  NA 140 145 142 141 139  K 4.3 5.1 4.9 4.1 3.5  CL 102 111 108 112* 112*  CO2 25 25  --  23 24  GLUCOSE 98 110* 106* 91 94  BUN 19 52* 58* 52* 42*  CREATININE 1.18 1.42* 1.30* 1.17 1.19  CALCIUM 10.0 9.3  --  8.3* 8.3*  MG 2.0  --   --   --   --   PHOS 3.2  --   --   --   --      F/u labs ordered Unresulted Labs (From admission, onward)     Start     Ordered   03/07/21 1807  Urine Culture  Once,   R       Question Answer Comment  Indication Acute gross hematuria   Patient immune status Normal      03/07/21 1811   03/07/21 7989  Basic metabolic panel  Daily,   R      03/06/21 1709   03/07/21 0500  CBC  Daily,   R      03/06/21 1709  Signed, Terrilee Croak, MD [facility] 03/08/2021

## 2021-03-09 DIAGNOSIS — K922 Gastrointestinal hemorrhage, unspecified: Secondary | ICD-10-CM | POA: Diagnosis not present

## 2021-03-09 LAB — TYPE AND SCREEN
ABO/RH(D): O POS
Antibody Screen: NEGATIVE
Unit division: 0
Unit division: 0
Unit division: 0

## 2021-03-09 LAB — BASIC METABOLIC PANEL
Anion gap: 8 (ref 5–15)
BUN: 25 mg/dL — ABNORMAL HIGH (ref 8–23)
CO2: 21 mmol/L — ABNORMAL LOW (ref 22–32)
Calcium: 8.5 mg/dL — ABNORMAL LOW (ref 8.9–10.3)
Chloride: 112 mmol/L — ABNORMAL HIGH (ref 98–111)
Creatinine, Ser: 1 mg/dL (ref 0.61–1.24)
GFR, Estimated: >60 mL/min (ref >60)
Glucose, Bld: 81 mg/dL (ref 70–99)
Potassium: 3.3 mmol/L — ABNORMAL LOW (ref 3.5–5.1)
Sodium: 141 mmol/L (ref 135–145)

## 2021-03-09 LAB — CBC
HCT: 24.6 % — ABNORMAL LOW (ref 39.0–52.0)
Hemoglobin: 8.1 g/dL — ABNORMAL LOW (ref 13.0–17.0)
MCH: 30.5 pg (ref 26.0–34.0)
MCHC: 32.9 g/dL (ref 30.0–36.0)
MCV: 92.5 fL (ref 80.0–100.0)
Platelets: 84 10*3/uL — ABNORMAL LOW (ref 150–400)
RBC: 2.66 MIL/uL — ABNORMAL LOW (ref 4.22–5.81)
RDW: 15.1 % (ref 11.5–15.5)
WBC: 5.9 10*3/uL (ref 4.0–10.5)
nRBC: 0 % (ref 0.0–0.2)

## 2021-03-09 LAB — BPAM RBC
Blood Product Expiration Date: 202210222359
Blood Product Expiration Date: 202210222359
Blood Product Expiration Date: 202210272359
ISSUE DATE / TIME: 202209201459
ISSUE DATE / TIME: 202209201814
ISSUE DATE / TIME: 202209220402
Unit Type and Rh: 5100
Unit Type and Rh: 9500
Unit Type and Rh: 9500

## 2021-03-09 LAB — URINE CULTURE

## 2021-03-09 LAB — PREPARE RBC (CROSSMATCH)

## 2021-03-09 MED ORDER — QUETIAPINE FUMARATE 50 MG PO TABS
50.0000 mg | ORAL_TABLET | Freq: Every day | ORAL | Status: DC
  Administered 2021-03-09 – 2021-03-10 (×2): 50 mg via ORAL
  Filled 2021-03-09 (×2): qty 1

## 2021-03-09 MED ORDER — HYDROXYZINE HCL 50 MG/ML IM SOLN
25.0000 mg | Freq: Four times a day (QID) | INTRAMUSCULAR | Status: DC | PRN
Start: 2021-03-09 — End: 2021-03-11
  Filled 2021-03-09: qty 0.5

## 2021-03-09 NOTE — Progress Notes (Signed)
PROGRESS NOTE  [name]  DOB: [birth year]  PCP: Axel Filler, MD ZOX:[id number]  DOA: 03/06/2021  LOS: 3 days  Hospital Day: 4   Chief Complaint  Patient presents with   GI Bleeding    Brief narrative: [name] is a 75 y.o. male with PMH significant for HTN, HLD, CAD/MI s/p stents, A. fib, AAA, pancreatic mass, CKD, GERD, Barrett's esophagus, lung cancer s/p resection, BPH.   Patient presented to the ED on 9/20 with complaint of BRBPR. Patient was seen by his PCP on 9/19 for follow-up of malnutrition and unintentional weight loss of about 40 pounds in 1 year. Evidently he was started on Xarelto on 9/19 for A. fib.  He took the first dose of medicine the same day at about 11 AM. Next morning, patient woke up with abdominal discomfort and also noticed blood in his urine and stool.  Blood loss progressed over the day without nausea or vomiting.  EMS noted blood pressure was low in 60s in the field and started on IV fluid and brought to the ED.   In the ED, patient was tachycardic over 100, blood pressure was low in the 80s which improved to 90s with IV fluid. Labs with a hemoglobin low at 8.4 compared to 11.9 yesterday. He was given a unit of PRBC.  He continued to have BRBPR in the ED. CTA suggested an active bleeding from the duodenal bulb GI consultation was obtained and he was taken for urgent endoscopy. Admitted to hospitalist service. See below for details.  Subjective: Patient was seen and examined this afternoon.  Propped up in bed.  Not in distress.  Oriented and conversational today.  He denies taking any alcohol prior to presentation as previously thought.    Assessment/Plan: Acute GI bleeding -Apparently after taking 1 dose of Xarelto, patient started having multiple episodes of BRBPR and hence came to the ED -Underwent emergent EGD on 9/20. Per EGD note, patient had old blood/clot in the entire stomach.  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  He was also noted to have abnormal appearing ampulla concerning for adenomatous change but biopsy was not taken because of risk of rebleeding. -Observe for 48 hours after EGD.  Hemoglobin has stabilized.  No longer seems to be bleeding.  Xarelto on hold.  Currently on IV Protonix 40 mg twice daily.     Acute blood loss anemia Macrocytic anemia of chronic disease -Hemoglobin lowest at 6.4 on 9/22.  Received a total of 3 units PRBCs.  Hemoglobin is stable at this time.  Continue to monitor. Recent Labs    11/06/20 1844 11/07/20 0618 03/07/21 0236 03/07/21 1238 03/08/21 0236 03/08/21 0842 03/09/21 0247  HGB  --    < > 7.7* 7.5* 6.4* 7.7* 8.1*  MCV  --    < > 92.8  --  93.7  --  92.5  VITAMINB12 958*  --   --   --   --   --   --   FOLATE 32.9  --   --   --   --   --   --   FERRITIN 489*  --   --   --   --   --   --   TIBC 140*  --   --   --   --   --   --   IRON  38*  --   --   --   --   --   --   RETICCTPCT 1.2  --   --   --   --   --   --    < > = values in this interval not displayed.   Hematuria BPH/chronic Foley -Has a chronic Foley in place because of bladder obstruction related to BPH  -On admission patient had hematuria. -Urinalysis with turbid yellow urine with 2+ leukocytes, many bacteria, crystals in yeast. -Hematuria gradually cleared up.   -Continue Flomax  Acute delirium/restlessness agitation -Patient was having intermittent episodes of agitation and restlessness.  Initially patient admitted to using hard liquor daily.  His agitation was suspected to be from alcohol withdrawal.  He was placed on CIWA protocol with Ativan as needed.  I also started him on Seroquel 25 mg last night with some benefit.  Today his mental status is improved.  He denies doing any alcohol prior to presentation.  In any case mental status is improving already. -I will increase Seroquel dose to  50 mg at bedtime and minimize use of IV sedating..   Essential hypertension -Not on BP meds at home. Continue to monitor.   CAD/MI s/p stents HLD, AAA -Continue Crestor.  Previously, patient was not aspirin.  On 9/19 as an outpatient, he was switched to Xarelto after which he started having GI bleeding.  Currently Xarelto on hold.  A. Fib -Xarelto on hold   CKD 2 -Creatinine at baseline. Recent Labs    11/10/20 0621 11/11/20 0231 11/12/20 0711 11/13/20 0136 03/05/21 1157 03/06/21 1211 03/06/21 1226 03/07/21 0236 03/08/21 0236 03/09/21 0247  BUN 11 9 7* 8 19 52* 58* 52* 42* 25*  CREATININE 1.09 1.10 1.25* 1.28* 1.18 1.42* 1.30* 1.17 1.19 1.00   History of pancreatic abnormality -he has a history of pancreatic abnormality which has been followed with MRCP most recently (May 2022 - mass not confidently identified)   lung cancer s/p resection, -Respiratory status stable   Mobility: Encourage ambulation when mental status improves Code Status:   Code Status: Full Code  Nutritional status: Body mass index is 17.42 kg/m. Nutrition Problem: Severe Malnutrition Etiology: chronic illness, cancer and cancer related treatments Signs/Symptoms: severe fat depletion, severe muscle depletion Diet:  Diet Order             Diet full liquid Room service appropriate? Yes; Fluid consistency: Thin  Diet effective now                  DVT prophylaxis:  Place TED hose Start: 03/06/21 1708   Antimicrobials: None Fluid: Continuing normal saline at 75 mill per hour Consultants: GI Family Communication: None at bedside  Status is: Inpatient  Remains inpatient appropriate because: Monitor hemoglobin and mental status Dispo: The patient is from: Home              Anticipated d/c is to: Pending PT eval              Patient currently is not medically stable to d/c.   Difficult to place patient No     Infusions:   sodium chloride 75 mL/hr at 03/09/21 1200    Scheduled  Meds:  (feeding supplement) PROSource Plus  30 mL Oral TID BM   Chlorhexidine Gluconate Cloth  6 each Topical Daily   feeding supplement  1 Container Oral TID BM   folic acid  1 mg Oral Daily   mouth rinse  15  mL Mouth Rinse BID   multivitamin with minerals  1 tablet Oral Daily   pantoprazole (PROTONIX) IV  40 mg Intravenous Q12H   QUEtiapine  50 mg Oral QHS   rosuvastatin  20 mg Oral Daily   tamsulosin  0.8 mg Oral QPM   thiamine  100 mg Oral Daily   Or   thiamine  100 mg Intravenous Daily    Antimicrobials: Anti-infectives (From admission, onward)    None       PRN meds: acetaminophen **OR** acetaminophen, hydrALAZINE, hydrOXYzine, levalbuterol, LORazepam **OR** LORazepam, morphine injection   Objective: Vitals:   03/09/21 1226 03/09/21 1300  BP: (!) 148/70 (!) 152/83  Pulse:  80  Resp:  17  Temp:    SpO2:  100%    Intake/Output Summary (Last 24 hours) at 03/09/2021 1519 Last data filed at 03/09/2021 1330 Gross per 24 hour  Intake 1502.04 ml  Output 4100 ml  Net -2597.96 ml   Filed Weights   03/06/21 1145 03/06/21 1522 03/06/21 1818  Weight: 59.4 kg 59.4 kg 59.9 kg   Weight change:  Body mass index is 17.42 kg/m.   Physical Exam: General exam: Pleasant, elderly African-American male.  Not in distress  skin: No rashes, lesions or ulcers. HEENT: Atraumatic, normocephalic, no obvious bleeding Lungs: Clear to auscultation bilaterally CVS: Regular rate and rhythm, no murmur GI/Abd soft, nontender, nondistended, bowel sound present CNS: Alert, awake, slow to respond but oriented to questions Psychiatry: Mood appropriate Extremities: No pedal edema, no calf tenderness  Data Review: I have personally reviewed the laboratory data and studies available.  Recent Labs  Lab 03/05/21 1157 03/06/21 1211 03/06/21 1226 03/07/21 0236 03/07/21 1238 03/08/21 0236 03/08/21 0842 03/09/21 0247  WBC 4.8 7.5  --  7.3  --  5.7  --  5.9  NEUTROABS  --  6.5  --   --    --   --   --   --   HGB 11.9* 8.4*   < > 7.7* 7.5* 6.4* 7.7* 8.1*  HCT 35.8* 26.6*   < > 23.3* 22.8* 19.3* 23.4* 24.6*  MCV 93 99.3  --  92.8  --  93.7  --  92.5  PLT 131* 112*  --  82*  --  76*  --  84*   < > = values in this interval not displayed.   Recent Labs  Lab 03/05/21 1157 03/06/21 1211 03/06/21 1226 03/07/21 0236 03/08/21 0236 03/09/21 0247  NA 140 145 142 141 139 141  K 4.3 5.1 4.9 4.1 3.5 3.3*  CL 102 111 108 112* 112* 112*  CO2 25 25  --  23 24 21*  GLUCOSE 98 110* 106* 91 94 81  BUN 19 52* 58* 52* 42* 25*  CREATININE 1.18 1.42* 1.30* 1.17 1.19 1.00  CALCIUM 10.0 9.3  --  8.3* 8.3* 8.5*  MG 2.0  --   --   --   --   --   PHOS 3.2  --   --   --   --   --     F/u labs ordered Unresulted Labs (From admission, onward)    None       Signed, Terrilee Croak, MD [facility] 03/09/2021

## 2021-03-09 NOTE — Progress Notes (Signed)
Pt alert and aware in bed. Pt told me about what brought him into the hospital. He is hopeful to be able to go home soon. He states he is not involved in church very much just every now and then he attends. The chaplain offered caring and supportive presence and listening ear.

## 2021-03-10 DIAGNOSIS — K922 Gastrointestinal hemorrhage, unspecified: Secondary | ICD-10-CM | POA: Diagnosis not present

## 2021-03-10 LAB — URINE CULTURE
Culture: >=100000 — AB
Special Requests: NORMAL

## 2021-03-10 MED ORDER — POTASSIUM CHLORIDE CRYS ER 20 MEQ PO TBCR
40.0000 meq | EXTENDED_RELEASE_TABLET | Freq: Once | ORAL | Status: AC
  Administered 2021-03-10: 40 meq via ORAL
  Filled 2021-03-10: qty 2

## 2021-03-10 NOTE — Progress Notes (Signed)
PROGRESS NOTE  [name]  DOB: Jan 07, 1946  PCP: Axel Filler, MD WPY:[id number]  DOA: 03/06/2021  LOS: 4 days  Hospital Day: 5   Chief Complaint  Patient presents with   GI Bleeding    Brief narrative: [name] is a 75 y.o. male with PMH significant for HTN, HLD, CAD/MI s/p stents, A. fib, AAA, pancreatic mass, CKD, GERD, Barrett's esophagus, lung cancer s/p resection, BPH.   Patient presented to the ED on 9/20 with complaint of BRBPR. Patient was seen by his PCP on 9/19 for follow-up of malnutrition and unintentional weight loss of about 40 pounds in 1 year. Evidently he was started on Xarelto on 9/19 for A. fib.  He took the first dose of medicine the same day at about 11 AM. Next morning, patient woke up with abdominal discomfort and also noticed blood in his urine and stool.  Blood loss progressed over the day without nausea or vomiting.  EMS noted blood pressure was low in 60s in the field and started on IV fluid and brought to the ED.   In the ED, patient was tachycardic over 100, blood pressure was low in the 80s which improved to 90s with IV fluid. Labs with a hemoglobin low at 8.4 compared to 11.9 yesterday. He was given a unit of PRBC.  He continued to have BRBPR in the ED. CTA suggested an active bleeding from the duodenal bulb GI consultation was obtained and he was taken for urgent endoscopy. Admitted to hospitalist service. See below for details.  Subjective: Patient was seen and examined this morning.  Pleasant elderly African-American male.  Propped up in bed.  Not in distress.  No new symptoms.  Not have any further GI bleeding or withdrawal symptoms.  Assessment/Plan: Acute GI bleeding -Apparently after taking 1 dose of Xarelto, patient started having multiple episodes of BRBPR and hence came to the ED -Underwent emergent EGD on 9/20. Per EGD note, patient had old blood/clot in the entire stomach.  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  He was also noted to have abnormal appearing ampulla concerning for adenomatous change but biopsy was not taken because of risk of rebleeding. -Observe for 48 hours after EGD.  Hemoglobin has stabilized.  No longer seems to be bleeding.  Xarelto on hold.  Currently on IV Protonix 40 mg twice daily.     Acute blood loss anemia Macrocytic anemia of chronic disease -Hemoglobin lowest at 6.4 on 9/22.  Received a total of 3 units PRBCs.  Hemoglobin is stable at this time.  Continue to monitor. Recent Labs    11/06/20 1844 11/07/20 0618 03/07/21 0236 03/07/21 1238 03/08/21 0236 03/08/21 0842 03/09/21 0247  HGB  --    < > 7.7* 7.5* 6.4* 7.7* 8.1*  MCV  --    < > 92.8  --  93.7  --  92.5  VITAMINB12 958*  --   --   --   --   --   --   FOLATE 32.9  --   --   --   --   --   --   FERRITIN 489*  --   --   --   --   --   --   TIBC 140*  --   --   --   --   --   --   IRON  38*  --   --   --   --   --   --   RETICCTPCT 1.2  --   --   --   --   --   --    < > = values in this interval not displayed.    Hematuria BPH/chronic Foley -Has a chronic Foley in place because of bladder obstruction related to BPH  -On admission patient had hematuria. -Urinalysis with turbid yellow urine with 2+ leukocytes, many bacteria, crystals in yeast. -Hematuria gradually cleared up.   -Continue Flomax  Acute delirium/restlessness agitation -Patient was having intermittent episodes of agitation and restlessness.  Initially patient admitted to using hard liquor daily.  His agitation was suspected to be from alcohol withdrawal.  He was placed on CIWA protocol with Ativan as needed.  He is also on Seroquel 50 mg with some benefit.  Mental status improved.  Has not required rescue Ativan in last 24 hours   Essential hypertension -Not on BP meds at home. Continue to monitor.   CAD/MI s/p stents HLD, AAA -Continue  Crestor.  Previously, patient was not aspirin.  On 9/19 as an outpatient, he was switched to Xarelto after which he started having GI bleeding.  Currently Xarelto on hold.  A. Fib -Xarelto on hold.  I would not restart anticoagulation on hold because of the risk of recurrence of bleeding.  After a week or 2, patient can be initiated on baby aspirin.   CKD 2 -Creatinine at baseline. Recent Labs    11/10/20 0621 11/11/20 0231 11/12/20 0711 11/13/20 0136 03/05/21 1157 03/06/21 1211 03/06/21 1226 03/07/21 0236 03/08/21 0236 03/09/21 0247  BUN 11 9 7* 8 19 52* 58* 52* 42* 25*  CREATININE 1.09 1.10 1.25* 1.28* 1.18 1.42* 1.30* 1.17 1.19 1.00    History of pancreatic abnormality -he has a history of pancreatic abnormality which has been followed with MRCP most recently (May 2022 - mass not confidently identified)   lung cancer s/p resection, -Respiratory status stable   Mobility: Encourage ambulation when mental status improves Code Status:   Code Status: Full Code  Nutritional status: Body mass index is 17.42 kg/m. Nutrition Problem: Severe Malnutrition Etiology: chronic illness, cancer and cancer related treatments Signs/Symptoms: severe fat depletion, severe muscle depletion Diet:  Diet Order             DIET SOFT Room service appropriate? Yes; Fluid consistency: Thin  Diet effective now                  DVT prophylaxis:  Place TED hose Start: 03/06/21 1708   Antimicrobials: None Fluid: Stop IV fluid Consultants: GI Family Communication: None at bedside  Status is: Inpatient  Remains inpatient appropriate because: Monitor hemoglobin and mental status Dispo: The patient is from: Home              Anticipated d/c is to: Pending PT eval              Patient currently is not medically stable to d/c.   Difficult to place patient No     Infusions:     Scheduled Meds:  (feeding supplement) PROSource Plus  30 mL Oral TID BM   Chlorhexidine Gluconate Cloth   6 each Topical Daily   feeding supplement  1 Container Oral TID BM   folic acid  1 mg Oral Daily   mouth rinse  15 mL Mouth Rinse BID   multivitamin with minerals  1 tablet Oral  Daily   pantoprazole (PROTONIX) IV  40 mg Intravenous Q12H   QUEtiapine  50 mg Oral QHS   rosuvastatin  20 mg Oral Daily   tamsulosin  0.8 mg Oral QPM   thiamine  100 mg Oral Daily   Or   thiamine  100 mg Intravenous Daily    Antimicrobials: Anti-infectives (From admission, onward)    None       PRN meds: acetaminophen **OR** acetaminophen, hydrALAZINE, hydrOXYzine, levalbuterol   Objective: Vitals:   03/10/21 1110 03/10/21 1414  BP: 128/79 137/79  Pulse: 68 81  Resp: 16 18  Temp: 97.8 F (36.6 C) 98.2 F (36.8 C)  SpO2: 99% 98%    Intake/Output Summary (Last 24 hours) at 03/10/2021 1427 Last data filed at 03/10/2021 1002 Gross per 24 hour  Intake 1909.08 ml  Output 2400 ml  Net -490.92 ml    Filed Weights   03/06/21 1145 03/06/21 1522 03/06/21 1818  Weight: 59.4 kg 59.4 kg 59.9 kg   Weight change:  Body mass index is 17.42 kg/m.   Physical Exam: General exam: Pleasant, elderly African-American male.  Not in physical distress skin: No rashes, lesions or ulcers. HEENT: Atraumatic, normocephalic, no obvious bleeding Lungs: Clear to auscultation bilaterally CVS: Regular rate and rhythm, no murmur GI/Abd soft, nontender, nondistended, bowel sound present CNS: Alert, awake, oriented x3 today  psychiatry: Mood appropriate Extremities: No pedal edema, no calf tenderness  Data Review: I have personally reviewed the laboratory data and studies available.  Recent Labs  Lab 03/05/21 1157 03/06/21 1211 03/06/21 1226 03/07/21 0236 03/07/21 1238 03/08/21 0236 03/08/21 0842 03/09/21 0247  WBC 4.8 7.5  --  7.3  --  5.7  --  5.9  NEUTROABS  --  6.5  --   --   --   --   --   --   HGB 11.9* 8.4*   < > 7.7* 7.5* 6.4* 7.7* 8.1*  HCT 35.8* 26.6*   < > 23.3* 22.8* 19.3* 23.4* 24.6*  MCV  93 99.3  --  92.8  --  93.7  --  92.5  PLT 131* 112*  --  82*  --  76*  --  84*   < > = values in this interval not displayed.    Recent Labs  Lab 03/05/21 1157 03/06/21 1211 03/06/21 1226 03/07/21 0236 03/08/21 0236 03/09/21 0247  NA 140 145 142 141 139 141  K 4.3 5.1 4.9 4.1 3.5 3.3*  CL 102 111 108 112* 112* 112*  CO2 25 25  --  23 24 21*  GLUCOSE 98 110* 106* 91 94 81  BUN 19 52* 58* 52* 42* 25*  CREATININE 1.18 1.42* 1.30* 1.17 1.19 1.00  CALCIUM 10.0 9.3  --  8.3* 8.3* 8.5*  MG 2.0  --   --   --   --   --   PHOS 3.2  --   --   --   --   --      F/u labs ordered Unresulted Labs (From admission, onward)    None       Signed, Terrilee Croak, MD [facility] 03/10/2021

## 2021-03-10 NOTE — Plan of Care (Signed)
  Problem: Education: Goal: Knowledge of General Education information will improve Description: Including pain rating scale, medication(s)/side effects and non-pharmacologic comfort measures Outcome: Progressing   Problem: Clinical Measurements: Goal: Will remain free from infection Outcome: Progressing Goal: Diagnostic test results will improve Outcome: Progressing   

## 2021-03-10 NOTE — Evaluation (Signed)
Physical Therapy Evaluation Patient Details Name: [name] MRN: [id number] DOB: 03-Dec-1945 Today's Date: 03/10/2021  History of Present Illness  [name] is a 75 y.o. male with PMH significant for HTN, HLD, CAD/MI s/p stents, A. fib, AAA, pancreatic mass, CKD, GERD, Barrett's esophagus, lung cancer s/p resection, BPH.    Patient presented to the ED on 9/20 with complaint of BRBPR.  Clinical Impression  Pt admitted with above diagnosis. Pt currently with functional limitations due to the deficits listed below (see PT Problem List). Pt will benefit from skilled PT to increase their independence and safety with mobility to allow discharge to the venue listed below.  Pt is anxious to move and happy to walk with PT.  Will work towards regaining independence with ambulation without RW.      Recommendations for follow up therapy are one component of a multi-disciplinary discharge planning process, led by the attending physician.  Recommendations may be updated based on patient status, additional functional criteria and insurance authorization.  Follow Up Recommendations No PT follow up    Equipment Recommendations  None recommended by PT    Recommendations for Other Services       Precautions / Restrictions Precautions Precautions: Fall Restrictions Weight Bearing Restrictions: No      Mobility  Bed Mobility Overal bed mobility: Needs Assistance Bed Mobility: Supine to Sit     Supine to sit: Supervision          Transfers   Equipment used: Rolling walker (2 wheeled)                Ambulation/Gait Ambulation/Gait assistance: Min guard Gait Distance (Feet): 510 Feet Assistive device: Rolling walker (2 wheeled) Gait Pattern/deviations: Trunk flexed;Step-through pattern     General Gait Details: cues for posture  Stairs            Wheelchair Mobility    Modified Rankin (Stroke Patients Only)       Balance Overall balance assessment: Needs  assistance         Standing balance support: Bilateral upper extremity supported Standing balance-Leahy Scale: Fair                               Pertinent Vitals/Pain Pain Assessment: No/denies pain    Home Living Family/patient expects to be discharged to:: Private residence Living Arrangements: Alone (siblings come check on him) Available Help at Discharge: Family;Available PRN/intermittently Type of Home: Apartment Home Access: Stairs to enter Entrance Stairs-Rails: Can reach both Entrance Stairs-Number of Steps: 4 Home Layout: One level Home Equipment: Walker - 2 wheels      Prior Function Level of Independence: Independent         Comments: sister assists with transportation, pt otherwise independent     Hand Dominance   Dominant Hand: Right    Extremity/Trunk Assessment   Upper Extremity Assessment Upper Extremity Assessment: Overall WFL for tasks assessed    Lower Extremity Assessment Lower Extremity Assessment: Generalized weakness       Communication   Communication: No difficulties  Cognition Arousal/Alertness: Awake/alert Behavior During Therapy: WFL for tasks assessed/performed Overall Cognitive Status: Within Functional Limits for tasks assessed                                        General Comments      Exercises  Assessment/Plan    PT Assessment Patient needs continued PT services  PT Problem List Decreased strength;Decreased activity tolerance;Decreased mobility       PT Treatment Interventions DME instruction;Gait training;Functional mobility training;Therapeutic activities;Therapeutic exercise;Balance training    PT Goals (Current goals can be found in the Care Plan section)  Acute Rehab PT Goals Patient Stated Goal: walk PT Goal Formulation: With patient Time For Goal Achievement: 03/24/21 Potential to Achieve Goals: Good    Frequency Min 3X/week   Barriers to discharge         Co-evaluation               AM-PAC PT "6 Clicks" Mobility  Outcome Measure Help needed turning from your back to your side while in a flat bed without using bedrails?: None Help needed moving from lying on your back to sitting on the side of a flat bed without using bedrails?: None Help needed moving to and from a bed to a chair (including a wheelchair)?: A Little Help needed standing up from a chair using your arms (e.g., wheelchair or bedside chair)?: A Little Help needed to walk in hospital room?: A Little Help needed climbing 3-5 steps with a railing? : A Little 6 Click Score: 20    End of Session Equipment Utilized During Treatment: Gait belt Activity Tolerance: Patient tolerated treatment well Patient left: in chair Nurse Communication: Mobility status PT Visit Diagnosis: Difficulty in walking, not elsewhere classified (R26.2)    Time: 1455-1530 PT Time Calculation (min) (ACUTE ONLY): 35 min   Charges:   PT Evaluation $PT Eval Moderate Complexity: 1 Mod PT Treatments $Gait Training: 8-22 mins        Jailene Cupit L. Tamala Julian, [place]  03/10/2021    Galen Manila 03/10/2021, 4:52 PM

## 2021-03-11 DIAGNOSIS — K922 Gastrointestinal hemorrhage, unspecified: Secondary | ICD-10-CM | POA: Diagnosis not present

## 2021-03-11 LAB — BASIC METABOLIC PANEL
Anion gap: 5 (ref 5–15)
BUN: 19 mg/dL (ref 8–23)
CO2: 25 mmol/L (ref 22–32)
Calcium: 8.7 mg/dL — ABNORMAL LOW (ref 8.9–10.3)
Chloride: 111 mmol/L (ref 98–111)
Creatinine, Ser: 1.03 mg/dL (ref 0.61–1.24)
GFR, Estimated: >60 mL/min (ref >60)
Glucose, Bld: 97 mg/dL (ref 70–99)
Potassium: 3.5 mmol/L (ref 3.5–5.1)
Sodium: 141 mmol/L (ref 135–145)

## 2021-03-11 LAB — CBC WITH DIFFERENTIAL/PLATELET
Abs Immature Granulocytes: 0.01 10*3/uL (ref 0.00–0.07)
Basophils Absolute: 0 10*3/uL (ref 0.0–0.1)
Basophils Relative: 0 %
Eosinophils Absolute: 0.1 10*3/uL (ref 0.0–0.5)
Eosinophils Relative: 2 %
HCT: 23.2 % — ABNORMAL LOW (ref 39.0–52.0)
Hemoglobin: 7.8 g/dL — ABNORMAL LOW (ref 13.0–17.0)
Immature Granulocytes: 0 %
Lymphocytes Relative: 22 %
Lymphs Abs: 1.4 10*3/uL (ref 0.7–4.0)
MCH: 31.2 pg (ref 26.0–34.0)
MCHC: 33.6 g/dL (ref 30.0–36.0)
MCV: 92.8 fL (ref 80.0–100.0)
Monocytes Absolute: 0.3 10*3/uL (ref 0.1–1.0)
Monocytes Relative: 6 %
Neutro Abs: 4.3 10*3/uL (ref 1.7–7.7)
Neutrophils Relative %: 70 %
Platelets: 98 10*3/uL — ABNORMAL LOW (ref 150–400)
RBC: 2.5 MIL/uL — ABNORMAL LOW (ref 4.22–5.81)
RDW: 14.8 % (ref 11.5–15.5)
WBC: 6.2 10*3/uL (ref 4.0–10.5)
nRBC: 0 % (ref 0.0–0.2)

## 2021-03-11 MED ORDER — SULFAMETHOXAZOLE-TRIMETHOPRIM 800-160 MG PO TABS: 1.0000 | ORAL_TABLET | Freq: Two times a day (BID) | ORAL | 0 refills | Status: AC

## 2021-03-11 MED ORDER — PANTOPRAZOLE SODIUM 40 MG PO TBEC: 40.0000 mg | DELAYED_RELEASE_TABLET | Freq: Every day | ORAL | 0 refills | Status: DC

## 2021-03-11 NOTE — Progress Notes (Signed)
   03/11/21 1200  Mobility  Activity Ambulated in hall  Level of Assistance Modified independent, requires aide device or extra time  Assistive Device Front wheel walker  Distance Ambulated (ft) 1020 ft  Mobility Ambulated with assistance in hallway  Mobility Response Tolerated well  Mobility performed by Mobility specialist  $Mobility charge 1 Mobility   Pt agreeable to ambulate today. Ambulated in hall about 1057ft with RW, tolerated well. Left pt in chair. Pt requested some washcloths to wash his feet, as well as asked if someone could call his family. Gave pt some clean cloths for him to use and notified NT of second request. No other complaints.   [place] Specialist Acute Rehab Services Office: [phone]

## 2021-03-11 NOTE — Discharge Summary (Signed)
Physician Discharge Summary  [name] DOB: 25-May-1946 DOA: 03/06/2021  PCP: [name] Filler, MD  Admit date: 03/06/2021 Discharge date: 03/11/2021  Admitted From: home Discharge disposition: Home   Code Status: Full Code   Discharge Diagnosis:   Active Problems:   Upper GI bleed   Anticoagulated   Symptomatic anemia   Hypovolemic shock ([place])   Dieulafoy lesion of duodenum   Acute GI bleeding    Chief Complaint  Patient presents with   GI Bleeding    Brief narrative: [name] is a 75 y.o. male with PMH significant for HTN, HLD, CAD/MI s/p stents, A. fib, AAA, pancreatic mass, CKD, GERD, Barrett's esophagus, lung cancer s/p resection, BPH.   Patient presented to the ED on 9/20 with complaint of BRBPR. Patient was seen by his PCP on 9/19 for follow-up of malnutrition and unintentional weight loss of about 40 pounds in 1 year. Evidently he was started on Xarelto on 9/19 for A. fib.  He took the first dose of medicine the same day at about 11 AM. Next morning, patient woke up with abdominal discomfort and also noticed blood in his urine and stool.  Blood loss progressed over the day without nausea or vomiting.  EMS noted blood pressure was low in 60s in the field and started on IV fluid and brought to the ED.   In the ED, patient was tachycardic over 100, blood pressure was low in the 80s which improved to 90s with IV fluid. Labs with a hemoglobin low at 8.4 compared to 11.9 yesterday. He was given a unit of PRBC.  He continued to have BRBPR in the ED. CTA suggested an active bleeding from the duodenal bulb GI consultation was obtained and he was taken for urgent endoscopy. Admitted to hospitalist service. See below for details.  Subjective: Patient was seen and examined this morning.   Not in distress.  No new symptoms.  Alert, awake, oriented x3.  Hemoglobin stable.  Feels good enough to go home.  Hospital course: Acute GI  bleeding -Apparently after taking 1 dose of Xarelto, patient started having multiple episodes of BRBPR and hence came to the ED -Underwent emergent EGD on 9/20. Per EGD note, patient had old blood/clot in the entire stomach.  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  He was also noted to have abnormal appearing ampulla concerning for adenomatous change but biopsy was not taken because of risk of rebleeding. -No evidence of further bleeding since then.  Hemoglobin stabilized. -Xarelto has been stopped.  Prior to Xarelto he was on aspirin 81 mg daily.  He can go back to taking it after 1 week of discharge.  He will also continue Protonix 40 g daily and follow-up with GI as an outpatient.   Acute blood loss anemia Macrocytic anemia of chronic disease -Hemoglobin lowest at 6.4 on 9/22.  Received a total of 3 units PRBCs.  Hemoglobin is stable at this time.  Recent Labs    11/06/20 1844 11/07/20 0618 03/07/21 1238 03/08/21 0236 03/08/21 0842 03/09/21 0247 03/11/21 0929  HGB  --    < > 7.5* 6.4* 7.7* 8.1* 7.8*  MCV  --    < >  --  93.7  --  92.5 92.8  VITAMINB12 958*  --   --   --   --   --   --  FOLATE 32.9  --   --   --   --   --   --   FERRITIN 489*  --   --   --   --   --   --   TIBC 140*  --   --   --   --   --   --   IRON 38*  --   --   --   --   --   --   RETICCTPCT 1.2  --   --   --   --   --   --    < > = values in this interval not displayed.   Hematuria BPH/chronic Foley -Has a chronic Foley in place because of bladder obstruction related to BPH  -On admission patient had hematuria which cleared up eventually.  Flomax to continue.  E. coli UTI associated with chronic Foley catheter -Urinalysis with turbid yellow urine with 2+ leukocytes, many bacteria, crystals in yeast. -Urine culture grew E. coli more than 1000 CFU per mL.  Per sensitivity report, will discharge on  Bactrim DS for 3 days.  Acute delirium/restlessness agitation -Patient was having intermittent episodes of agitation and restlessness.  Initially patient admitted to using hard liquor daily.  His agitation was suspected to be from alcohol withdrawal.  He was placed on CIWA protocol with Ativan as needed.  Mental status gradually improved.   Essential hypertension -Not on BP meds at home. Continue to monitor.   CAD/MI s/p stents HLD, AAA A. fib -Previously, patient was not aspirin.  On 9/19 as an outpatient, he was switched to Xarelto after which he started having GI bleeding.  Currently Xarelto on hold.  Patient can switch back to aspirin after week of discharge. -Continue Crestor.    CKD 2 -Creatinine at baseline. Recent Labs    11/11/20 0231 11/12/20 0711 11/13/20 0136 03/05/21 1157 03/06/21 1211 03/06/21 1226 03/07/21 0236 03/08/21 0236 03/09/21 0247 03/11/21 0929  BUN 9 7* 8 19 52* 58* 52* 42* 25* 19  CREATININE 1.10 1.25* 1.28* 1.18 1.42* 1.30* 1.17 1.19 1.00 1.03   History of pancreatic abnormality -he has a history of pancreatic abnormality which has been followed with MRCP most recently (May 2022 - mass not confidently identified)   lung cancer s/p resection, -Respiratory status stable   Allergies as of 03/11/2021       Reactions   Candesartan Cilexetil-hctz Other (See Comments)   Acute renal failure   Hydrochlorothiazide Other (See Comments)   Acute renal failure   Shellfish Allergy Other (See Comments)   Gout occurs        Medication List     STOP taking these medications    nitrofurantoin 100 MG capsule Commonly known as: MACRODANTIN   Rivaroxaban 15 MG Tabs tablet Commonly known as: XARELTO       TAKE these medications    albuterol 108 (90 Base) MCG/ACT inhaler Commonly known as: VENTOLIN HFA INHALE 1-2 PUFFS BY MOUTH EVERY 6 HOURS AS NEEDED FOR WHEEZE OR SHORTNESS OF BREATH What changed: See the new instructions.    HYDROcodone-acetaminophen 7.5-325 MG tablet Commonly known as: NORCO Take 1 tablet by mouth 2 (two) times daily as needed for moderate pain.   multivitamin with minerals Tabs tablet Take 1 tablet by mouth daily.   pantoprazole 40 MG tablet Commonly known as: Protonix Take 1 tablet (40 mg total) by mouth daily.   rosuvastatin 20 MG tablet Commonly known as: CRESTOR Take 1 tablet (20  mg total) by mouth daily.   sulfamethoxazole-trimethoprim 800-160 MG tablet Commonly known as: BACTRIM DS Take 1 tablet by mouth 2 (two) times daily for 3 days.   tamsulosin 0.4 MG Caps capsule Commonly known as: FLOMAX Take 2 capsules (0.8 mg total) by mouth every evening.   Zenpep 20000-63000 units Cpep Generic drug: Pancrelipase (Lip-Prot-Amyl) TAKE 3 CAPSULES BY MOUTH 3 (THREE) TIMES DAILY BEFORE MEALS.        Discharge Instructions:  Diet Recommendation:  Discharge Diet Orders (From admission, onward)     Start     Ordered   03/11/21 0000  Diet - low sodium heart healthy        03/11/21 1143              Follow with Primary MD [name] Filler, MD in 7 days   Get CBC/BMP checked in next visit within 1 week by PCP or SNF MD ( we routinely change or add medications that can affect your baseline labs and fluid status, therefore we recommend that you get the mentioned basic workup next visit with your PCP, your PCP may decide not to get them or add new tests based on their clinical decision)  On your next visit with your PCP, please Get Medicines reviewed and adjusted.  Please request your PCP  to go over all Hospital Tests and Procedure/Radiological results at the follow up, please get all Hospital records sent to your Prim MD by signing hospital release before you go home.  Activity: As tolerated with Full fall precautions use walker/cane & assistance as needed  For Heart failure patients - Check your Weight same time everyday, if you gain over 2 pounds, or you develop in  leg swelling, experience more shortness of breath or chest pain, call your Primary MD immediately. Follow Cardiac Low Salt Diet and 1.5 lit/day fluid restriction.  If you have smoked or chewed Tobacco in the last 2 yrs please stop smoking, stop any regular Alcohol  and or any Recreational drug use.  If you experience worsening of your admission symptoms, develop shortness of breath, life threatening emergency, suicidal or homicidal thoughts you must seek medical attention immediately by calling 911 or calling your MD immediately  if symptoms less severe.  You Must read complete instructions/literature along with all the possible adverse reactions/side effects for all the Medicines you take and that have been prescribed to you. Take any new Medicines after you have completely understood and accpet all the possible adverse reactions/side effects.   Do not drive, operate heavy machinery, perform activities at heights, swimming or participation in water activities or provide baby sitting services if your were admitted for syncope or siezures until you have seen by Primary MD or a Neurologist and advised to do so again.  Do not drive when taking Pain medications.  Do not take more than prescribed Pain, Sleep and Anxiety Medications  Wear Seat belts while driving.   Please note You were cared for by a hospitalist during your hospital stay. If you have any questions about your discharge medications or the care you received while you were in the hospital after you are discharged, you can call the unit and asked to speak with the hospitalist on call if the hospitalist that took care of you is not available. Once you are discharged, your primary care physician will handle any further medical issues. Please note that NO REFILLS for any discharge medications will be authorized once you are discharged, as it is  imperative that you return to your primary care physician (or establish a relationship with a primary  care physician if you do not have one) for your aftercare needs so that they can reassess your need for medications and monitor your lab values.    Follow ups:    Follow-up Information     [name] Filler, MD Follow up.   Specialty: Internal Medicine Contact information: [place] [address] [phone]         Freada Bergeron, MD .   Specialties: Cardiology, Radiology Contact information: [address] [place] [address] [phone]                 Wound care:     Discharge Exam:   Vitals:   03/10/21 1110 03/10/21 1414 03/10/21 2033 03/11/21 0456  BP: 128/79 137/79 139/80 107/66  Pulse: 68 81 71 84  Resp: 16 18 14 16   Temp: 97.8 F (36.6 C) 98.2 F (36.8 C) 98.7 F (37.1 C) 98.8 F (37.1 C)  TempSrc: Oral Oral Oral Oral  SpO2: 99% 98% 100% 100%  Weight:      Height:        Body mass index is 17.42 kg/m.  General exam: Pleasant elderly African-American male.  Not in distress.  No new symptoms Skin: No rashes, lesions or ulcers. HEENT: Atraumatic, normocephalic, no obvious bleeding Lungs: Clear to auscultation bilaterally CVS: Regular rate and rhythm, no murmur GI/Abd soft, nontender, nondistended, bowel sound present CNS: Alert, awake, oriented x3 Psychiatry: Mood appropriate Extremities: No pedal edema, no calf tenderness  Time coordinating discharge: 35 minutes   The results of significant diagnostics from this hospitalization (including imaging, microbiology, ancillary and laboratory) are listed below for reference.    Procedures and Diagnostic Studies:   CT Angio Abd/Pel W and/or Wo Contrast  Result Date: 03/06/2021 CLINICAL DATA:  75 year old with abdominal pain, aortic dissection suspected. GI bleed and hematuria. Rule out abdominal aortic aneurysm. EXAM: CTA ABDOMEN AND PELVIS WITHOUT AND WITH CONTRAST TECHNIQUE: Multidetector CT imaging of the abdomen and pelvis was performed  using the standard protocol during bolus administration of intravenous contrast. Multiplanar reconstructed images and MIPs were obtained and reviewed to evaluate the vascular anatomy. CONTRAST:  140mL OMNIPAQUE IOHEXOL 350 MG/ML SOLN COMPARISON:  11/07/2020 FINDINGS: VASCULAR Aorta: Infrarenal abdominal aortic aneurysm measures up to 5.6 cm and previously measured 5.3 cm on 11/07/2020. Again noted is a large amount of mural thrombus within the abdominal aortic aneurysm. Mural thrombus within the aneurysm may have slightly decreased in the interim. Diffuse atherosclerotic disease and irregularity in the distal descending thoracic aorta. Distal descending thoracic aorta measures 3.7 cm and stable. No evidence for an aortic dissection. Small focal outpouchings in the distal descending thoracic aorta could be related to small penetrating ulcers. Most prominent outpouching is near the aortic hiatus and this measures approximately 0.7 cm in the AP dimension and similar to the previous examination. Aorta at the level of celiac artery measures 3.1 cm. Juxtarenal abdominal aorta measures approximately 3.0 cm in the transverse dimension. Celiac: Patent without evidence of aneurysm, dissection, vasculitis or significant stenosis. SMA: Acute angulation of the proximal SMA. There is mild narrowing at this area of angulation. Renals: Both renal arteries are patent without evidence of aneurysm, dissection, vasculitis, fibromuscular dysplasia or significant stenosis. Small accessory left renal artery. IMA: Origin appears to be occluded due to the aortic aneurysm and mural thrombus. 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 Right internal iliac artery is patent but there is significant disease near the origin with poorly defined saccular aneurysm near the origin and proximal aspect of the right internal iliac artery measuring 8-9 mm. Right external iliac artery is tortuous but patent without  significant stenosis. Left common iliac artery measures up to 1.3 cm. Left external iliac artery is tortuous but patent without significant stenosis. Left internal iliac artery is patent. Proximal Outflow: Proximal femoral arteries are patent bilaterally. Veins: Hepatic veins and main portal venous system are patent. Portal vein thrombus has resolved. Review of the MIP images confirms the above findings. NON-VASCULAR Lower chest: Lung bases are clear. Hepatobiliary: Calcified gallstones. Gallbladder appears to be decompressed. No focal liver lesion. No significant biliary dilatation. Pancreas: Again noted are calcifications at the pancreatic head. Poorly defined hypodensities at the pancreatic head are similar to the previous examination. No significant pancreatic duct dilatation or inflammation. Spleen: Normal in size without focal abnormality. Adrenals/Urinary Tract: Normal adrenal glands. Bilateral renal cysts. There is slightly hyperdense structure along the posterior left kidney on sequence 12, image 27. No enhancement within this hyperdense structure and probably represents a proteinaceous or hemorrhagic cyst. Vascular calcifications in the kidneys. There may be a tiny nonobstructive left kidney stone. Foley catheter is present. There is high-density material within the bladder which could represent wall calcifications or urinary calculi. Bladder is mildly distended despite having a Foley catheter. Tip of the Foley catheter is protruding along the anterior aspect of the bladder and probably within a diverticulum. Stomach/Bowel: Evidence for contrast extravasation in the proximal duodenum on sequence 5 image 58 and sequence 12, image 27. This is compatible with active GI bleeding in the proximal duodenum. Tubular type structure in the duodenum on sequence 5 image 31 is nonspecific. High-density material in the right colon and cecum is seen on the pre contrast images. There is no evidence of acute bleeding in  the colon. No evidence for a bowel obstruction. Lymphatic: No abdominopelvic lymph node enlargement. Reproductive: Prostate is enlarged and there is a Foley catheter extending into the urinary bladder. Other: Negative for ascites.  Negative for free air. Musculoskeletal: Multilevel degenerative changes in the lumbar spine. IMPRESSION: VASCULAR 1. Active GI bleeding with contrast extravasation in the proximal duodenum. Etiology for the GI bleeding is uncertain. 2. Abdominal aortic aneurysm measuring up to 5.6 cm. Aneurysm has enlarged since May 2022 when it measured 5.3 cm. Recommend referral to a vascular specialist. This recommendation follows ACR consensus guidelines: White Paper of the ACR Incidental Findings Committee II on Vascular Findings. J Am Coll Radiol 2013; 10:789-794. 3. Portal venous thrombus has resolved. 4. 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 5. 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 6. 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 NON-VASCULAR 1. Foley catheter is present but the tip is protruding anterior to the bladder. There is wall thickening in this area and suspect this is chronic and may be associated with a bladder diverticulum. Reportedly, the patient has hematuria and the position of the Foley catheter may be contributing to the hematuria. 2. Possible bladder calculi. 3. Again noted is heterogeneity at the pancreatic head with some calcifications and low-density areas. Questionable diverticulum or tubular structure in the descending duodenum poorly characterized. Patient had a MRI of the  abdomen to evaluate the pancreas in the past and recommend referral to that report for follow-up recommendations. 4. Cholelithiasis.  The gallbladder appears to be contracted. 5. Bilateral  renal cysts. Hyperdense cyst in left kidney upper pole is most compatible with a proteinaceous or hemorrhagic cyst. These results were called by telephone at the time of interpretation on 03/06/2021 at 1:52 pm to provider DAN FLOYD , who verbally acknowledged these results. Electronically Signed   By: Markus Daft M.D.   On: 03/06/2021 14:35     Labs:   Basic Metabolic Panel: Recent Labs  Lab 03/05/21 1157 03/05/21 1157 03/06/21 1211 03/06/21 1226 03/07/21 0236 03/08/21 0236 03/09/21 0247 03/11/21 0929  NA 140   < > 145 142 141 139 141 141  K 4.3  --  5.1 4.9 4.1 3.5 3.3* 3.5  CL 102  --  111 108 112* 112* 112* 111  CO2 25  --  25  --  23 24 21* 25  GLUCOSE 98   < > 110* 106* 91 94 81 97  BUN 19   < > 52* 58* 52* 42* 25* 19  CREATININE 1.18  --  1.42* 1.30* 1.17 1.19 1.00 1.03  CALCIUM 10.0  --  9.3  --  8.3* 8.3* 8.5* 8.7*  MG 2.0  --   --   --   --   --   --   --   PHOS 3.2  --   --   --   --   --   --   --    < > = values in this interval not displayed.   GFR Estimated Creatinine Clearance: 52.5 mL/min (by C-G formula based on SCr of 1.03 mg/dL). Liver Function Tests: Recent Labs  Lab 03/05/21 1157 03/06/21 1211  AST 30 30  ALT 14 13  ALKPHOS 80 52  BILITOT 0.3 0.5  PROT 7.0 6.1*  ALBUMIN 3.5* 2.9*   No results for input(s): LIPASE, AMYLASE in the last 168 hours. No results for input(s): AMMONIA in the last 168 hours. Coagulation profile No results for input(s): INR, PROTIME in the last 168 hours.  CBC: Recent Labs  Lab 03/06/21 1211 03/06/21 1226 03/07/21 0236 03/07/21 1238 03/08/21 0236 03/08/21 0842 03/09/21 0247 03/11/21 0929  WBC 7.5  --  7.3  --  5.7  --  5.9 6.2  NEUTROABS 6.5  --   --   --   --   --   --  4.3  HGB 8.4*   < > 7.7* 7.5* 6.4* 7.7* 8.1* 7.8*  HCT 26.6*   < > 23.3* 22.8* 19.3* 23.4* 24.6* 23.2*  MCV 99.3  --  92.8  --  93.7  --  92.5 92.8  PLT 112*  --  82*  --  76*  --  84* 98*   < > = values in this interval not displayed.    Cardiac Enzymes: No results for input(s): CKTOTAL, CKMB, CKMBINDEX, TROPONINI in the last 168 hours. BNP: Invalid input(s): POCBNP CBG: No results for input(s): GLUCAP in the last 168 hours. D-Dimer No results for input(s): DDIMER in the last 72 hours. Hgb A1c No results for input(s): HGBA1C in the last 72 hours. Lipid Profile No results for input(s): CHOL, HDL, LDLCALC, TRIG, CHOLHDL, LDLDIRECT in the last 72 hours. Thyroid function studies No results for input(s): TSH, T4TOTAL, T3FREE, THYROIDAB in the last 72 hours.  Invalid input(s): FREET3 Anemia work up No results for input(s): VITAMINB12, FOLATE, FERRITIN, TIBC,  IRON, RETICCTPCT in the last 72 hours. Microbiology Recent Results (from the past 240 hour(s))  Culture, Urine     Status: None   Collection Time: 03/05/21 11:30 AM   Specimen: Urine   Urine  Result Value Ref Range Status   Urine Culture, Routine Final report  Final   Organism ID, Bacteria Comment  Final    Comment: Greater than 2 organisms recovered, none predominant. Please submit another sample if clinically indicated. Greater than 100,000 colony forming units per mL   Microscopic Examination     Status: Abnormal   Collection Time: 03/05/21 11:30 AM   Urine  Result Value Ref Range Status   WBC, UA >30 (A) 0 - 5 /hpf Final    Comment: Clumps of leukocytes present.   RBC >30 (A) 0 - 2 /hpf Final   Epithelial Cells (non renal) 0-10 0 - 10 /hpf Final   Casts None seen None seen /lpf Final   Crystals Present (A) N/A Final   Crystal Type Triple Phosphate N/A Final   Bacteria, UA Many (A) None seen/Few Final   Yeast, UA Present (A) None seen Final  Resp Panel by RT-PCR (Flu A&B, Covid) Nasopharyngeal Swab     Status: None   Collection Time: 03/06/21 12:37 PM   Specimen: Nasopharyngeal Swab; Nasopharyngeal(NP) swabs in vial transport medium  Result Value Ref Range Status   SARS Coronavirus 2 by RT PCR NEGATIVE NEGATIVE Final    Comment:  (NOTE) SARS-CoV-2 target nucleic acids are NOT DETECTED.  The SARS-CoV-2 RNA is generally detectable in upper respiratory specimens during the acute phase of infection. The lowest concentration of SARS-CoV-2 viral copies this assay can detect is 138 copies/mL. A negative result does not preclude SARS-Cov-2 infection and should not be used as the sole basis for treatment or other patient management decisions. A negative result may occur with  improper specimen collection/handling, submission of specimen other than nasopharyngeal swab, presence of viral mutation(s) within the areas targeted by this assay, and inadequate number of viral copies(<138 copies/mL). A negative result must be combined with clinical observations, patient history, and epidemiological information. The expected result is Negative.  Fact Sheet for Patients:  EntrepreneurPulse.com.au  Fact Sheet for Healthcare Providers:  IncredibleEmployment.be  This test is no t yet approved or cleared by the [place] FDA and  has been authorized for detection and/or diagnosis of SARS-CoV-2 by FDA under an Emergency Use Authorization (EUA). This EUA will remain  in effect (meaning this test can be used) for the duration of the COVID-19 declaration under Section 564(b)(1) of the Act, 21 U.S.C.section 360bbb-3(b)(1), unless the authorization is terminated  or revoked sooner.       Influenza A by PCR NEGATIVE NEGATIVE Final   Influenza B by PCR NEGATIVE NEGATIVE Final    Comment: (NOTE) The Xpert Xpress SARS-CoV-2/FLU/RSV plus assay is intended as an aid in the diagnosis of influenza from Nasopharyngeal swab specimens and should not be used as a sole basis for treatment. Nasal washings and aspirates are unacceptable for Xpert Xpress SARS-CoV-2/FLU/RSV testing.  Fact Sheet for Patients: EntrepreneurPulse.com.au  Fact Sheet for Healthcare  Providers: IncredibleEmployment.be  This test is not yet approved or cleared by the [place] FDA and has been authorized for detection and/or diagnosis of SARS-CoV-2 by FDA under an Emergency Use Authorization (EUA). This EUA will remain in effect (meaning this test can be used) for the duration of the COVID-19 declaration under Section 564(b)(1) of the Act, 21 U.S.C. section 360bbb-3(b)(1),  unless the authorization is terminated or revoked.  Performed at [facility], [place] [address], [place], [place] [address]   MRSA Next Gen by PCR, Nasal     Status: None   Collection Time: 03/06/21  6:08 PM   Specimen: Nasal Mucosa; Nasal Swab  Result Value Ref Range Status   MRSA by PCR Next Gen NOT DETECTED NOT DETECTED Final    Comment: (NOTE) The GeneXpert MRSA Assay (FDA approved for NASAL specimens only), is one component of a comprehensive MRSA colonization surveillance program. It is not intended to diagnose MRSA infection nor to guide or monitor treatment for MRSA infections. Test performance is not FDA approved in patients less than 51 years old. Performed at [facility], [place] [address] [place], [place] [address]   Urine Culture     Status: Abnormal   Collection Time: 03/07/21  6:07 PM   Specimen: Urine, Catheterized  Result Value Ref Range Status   Specimen Description   Final    URINE, CATHETERIZED Performed at [facility] [address] [place], [place] [address]    Special Requests   Final    Normal Performed at [facility], [place] [address] [place], [place] [address]    Culture >=100,000 COLONIES/mL ESCHERICHIA COLI (A)  Final   Report Status 03/10/2021 FINAL  Final   Organism ID, Bacteria ESCHERICHIA COLI (A)  Final      Susceptibility   Escherichia coli - MIC*    AMPICILLIN >=32 RESISTANT Resistant     CEFAZOLIN >=64 RESISTANT Resistant     CEFEPIME <=0.12  SENSITIVE Sensitive     CEFTRIAXONE 32 RESISTANT Resistant     CIPROFLOXACIN <=0.25 SENSITIVE Sensitive     GENTAMICIN <=1 SENSITIVE Sensitive     IMIPENEM <=0.25 SENSITIVE Sensitive     NITROFURANTOIN <=16 SENSITIVE Sensitive     TRIMETH/SULFA <=20 SENSITIVE Sensitive     AMPICILLIN/SULBACTAM >=32 RESISTANT Resistant     PIP/TAZO <=4 SENSITIVE Sensitive     * >=100,000 COLONIES/mL ESCHERICHIA COLI     Signed: Mekisha Bittel  [facility] 03/11/2021, 11:43 AM

## 2021-03-15 ENCOUNTER — Telehealth: Payer: Self-pay | Admitting: Student in an Organized Health Care Education/Training Program

## 2021-03-16 DIAGNOSIS — R338 Other retention of urine: Secondary | ICD-10-CM | POA: Diagnosis not present

## 2021-04-04 ENCOUNTER — Other Ambulatory Visit: Payer: Self-pay

## 2021-04-04 MED ORDER — PANTOPRAZOLE SODIUM 40 MG PO TBEC: 40.0000 mg | DELAYED_RELEASE_TABLET | Freq: Every day | ORAL | 1 refills | Status: DC

## 2021-04-05 ENCOUNTER — Encounter (HOSPITAL_COMMUNITY): Payer: Self-pay | Admitting: Radiology

## 2021-04-12 ENCOUNTER — Other Ambulatory Visit: Payer: Self-pay

## 2021-04-12 ENCOUNTER — Ambulatory Visit (INDEPENDENT_AMBULATORY_CARE_PROVIDER_SITE_OTHER): Payer: Medicare HMO | Admitting: Vascular Surgery

## 2021-04-12 ENCOUNTER — Encounter: Payer: Self-pay | Admitting: Vascular Surgery

## 2021-04-12 VITALS — BP 150/93 | HR 59 | Temp 98.0°F | Resp 20 | Ht 73.0 in | Wt 136.0 lb

## 2021-04-12 DIAGNOSIS — I714 Abdominal aortic aneurysm, without rupture, unspecified: Secondary | ICD-10-CM | POA: Diagnosis not present

## 2021-04-12 DIAGNOSIS — I739 Peripheral vascular disease, unspecified: Secondary | ICD-10-CM | POA: Diagnosis not present

## 2021-04-12 NOTE — Progress Notes (Signed)
REASON FOR VISIT:   Follow-up of peripheral vascular disease and abdominal aortic aneurysm.  MEDICAL ISSUES:   ABDOMINAL AORTIC ANEURYSM: This patient's abdominal aortic aneurysm has enlarged to 5.6 cm.  At this point I would recommend elective repair given that the risk of rupture for an aneurysm of this size is 5 to 10 %/year.  Based on his CT angiogram I think he would be a candidate for endovascular repair.  However, there are multiple complicating issues.  First he will need preoperative cardiac clearance given his history of atrial fibrillation and coronary artery disease.  Has had a remote history of a myocardial infarction.  In addition he has a BPH and is scheduled for surgery on November 16.  Has had problems related to his indwelling Foley including a E. coli UTI.  I think it may be best to get this issue resolved before placing a prosthetic graft.  He also has a history of thrombocytopenia.  Last platelet count that I can find was 98,000.  He has a history of some mild chronic kidney disease although his most recent labs showed a GFR greater than 60.  I think the GI bleed issue has resolved he has had no further issues with this.  My plan is to get preoperative cardiac clearance and let urology proceed with their planned procedure and then see him back to discuss endovascular repair of his aneurysm.  We have also discussed this in the office today.  I explained the advantages and disadvantages of endovascular repair.  He understands that without repair he would be at high risk for rupture.  We have also discussed the importance of tobacco cessation.  PERIPHERAL VASCULAR DISEASE: Currently his peripheral vascular symptoms are stable.  I cannot palpate pedal pulses.  Both feet are warm and well-perfused however.  He did have biphasic Doppler signals in both feet based on his noninvasive study that was done back in March of this year.  HPI:   [name] is a pleasant 75 y.o. male  who I last saw on 08/30/2020 for follow-up of his abdominal aortic aneurysm and peripheral vascular disease.  His abdominal aortic aneurysm had remained stable in size at 5.2 cm.  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  I explained that in a normal risk patient we will consider elective repair of his aneurysm at 5.5 cm.  If the aneurysm within large he would need a CT scanning to further evaluate his options. I was also following him with peripheral vascular disease.  He had some mild calf claudication at the time of his last visit.   Since I saw him last, he was admitted to the hospital in 03/06/2021 with an upper GI bleed.  A CT scan suggested active bleeding from the duodenal bulb.  He was taken urgently for endoscopy. 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  Of note he has a history of BPH and has a chronic Foley in place because of bladder obstruction.  He is apparently scheduled for surgery by urology on November 16.  He has had an E. coli UTI in the past from having the chronic Foley catheter.   According to the records he also has a history of acute delirium possibly related to alcohol withdrawal.  He has a long list of medical problems including hypertension, coronary artery disease (history of  MI, status post stents), A. fib, history of lung cancer (status postresection), BPH, and and a history of a GI bleed.  Since I saw him last, he denies any abdominal pain or back pain.  He has a indwelling Foley catheter.  He is scheduled for surgery by urology on November 16.  Past Medical History:  Diagnosis Date   AAA (abdominal aortic aneurysm)    followed by vasc surgery   Alcohol abuse    Barretts esophagus    bx distal esophagus 2011 neg    Bladder outlet obstruction 04/02/2018   CAD in native artery    a. s/p stent to prox RCA and mid Cx.   Cataract     Cholelithiasis    Chronic osteomyelitis involving ankle and foot, right ([facility]) 07/13/2018   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   2006 - 3 adenomas largest 12 mm  2008 - no polyps 2013-  2 mm cecal polyp, adenoma, routine repeat colonoscopy 02/2017 approx 05/14/2017 18 polyps max 12 mm ALL adenomas recall 2019 [name], [facility], [facility]      Diverticulosis of colon    DJD (degenerative joint disease)    left hip   Full dentures    upper and lower   GERD (gastroesophageal reflux disease)    diet controlled    Gout    feet   Hiatal hernia    2 cm noted on upper endos 2011    Hx of adenomatous colonic polyps    Hyperlipidemia    Hypertension    Internal hemorrhoid    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 pt was on Plavix x 3 years then transitioned to ASA, no problems since   Non-small cell carcinoma of right lung, stage 1 ([facility]) 01/30/2016   Renal failure, acute ([facility])    hx of 2/2 medication, resolved   Tobacco abuse    smoker .5 ppd x 55 yrs   UTI (lower urinary tract infection)     Family History  Problem Relation Age of Onset   Diabetes Mother    Hypertension Sister    Heart disease Sister    Diabetes Sister    Arthritis Brother    Gout Brother    Colon cancer Neg Hx    Esophageal cancer Neg Hx    Rectal cancer Neg Hx    Stomach cancer Neg Hx    Colon polyps Neg Hx     SOCIAL HISTORY: Social History   Tobacco Use   Smoking status: Every Day    Packs/day: 0.50    Years: 55.00    Pack years: 27.50    Types: Cigarettes   Smokeless tobacco: Never  Substance Use Topics   Alcohol use: Not Currently    Comment: drinking 1-2 pts gin/week, (mixed with tap water not tonic)    Allergies  Allergen Reactions   Candesartan Cilexetil-Hctz Other (See Comments)    Acute renal failure   Hydrochlorothiazide Other (See Comments)    Acute renal failure   Shellfish  Allergy Other (See Comments)    Gout occurs    Current Outpatient Medications  Medication Sig Dispense Refill   albuterol (VENTOLIN HFA) 108 (90 Base) MCG/ACT inhaler INHALE 1-2 PUFFS BY MOUTH EVERY 6 HOURS AS NEEDED FOR WHEEZE OR SHORTNESS OF BREATH (Patient taking differently: Inhale 1-2 puffs into the lungs every 6 (six) hours as  needed for wheezing or shortness of breath.) 6.7 each 5   HYDROcodone-acetaminophen (NORCO) 7.5-325 MG tablet Take 1 tablet by mouth 2 (two) times daily as needed for moderate pain. 60 tablet 0   Multiple Vitamin (MULTIVITAMIN WITH MINERALS) TABS tablet Take 1 tablet by mouth daily.     pantoprazole (PROTONIX) 40 MG tablet Take 1 tablet (40 mg total) by mouth daily. 90 tablet 1   rosuvastatin (CRESTOR) 20 MG tablet Take 1 tablet (20 mg total) by mouth daily. 90 tablet 1   tamsulosin (FLOMAX) 0.4 MG CAPS capsule Take 2 capsules (0.8 mg total) by mouth every evening. 180 capsule 3   ZENPEP 20000-63000 units CPEP TAKE 3 CAPSULES BY MOUTH 3 (THREE) TIMES DAILY BEFORE MEALS. (Patient taking differently: Take 3 capsules by mouth 3 (three) times daily before meals.) 270 capsule 0   No current facility-administered medications for this visit.    REVIEW OF SYSTEMS:  [X]  denotes positive finding, [ ]  denotes negative finding Cardiac  Comments:  Chest pain or chest pressure:    Shortness of breath upon exertion:    Short of breath when lying flat:    Irregular heart rhythm:        Vascular    Pain in calf, thigh, or hip brought on by ambulation:    Pain in feet at night that wakes you up from your sleep:     Blood clot in your veins:    Leg swelling:         Pulmonary    Oxygen at home:    Productive cough:     Wheezing:         Neurologic    Sudden weakness in arms or legs:     Sudden numbness in arms or legs:     Sudden onset of difficulty speaking or slurred speech:    Temporary loss of vision in one eye:     Problems with dizziness:          Gastrointestinal    Blood in stool:  x   Vomited blood:         Genitourinary    Burning when urinating:     Blood in urine:        Psychiatric    Major depression:         Hematologic    Bleeding problems:    Problems with blood clotting too easily:        Skin    Rashes or ulcers:        Constitutional    Fever or chills:     PHYSICAL EXAM:   Vitals:   04/12/21 1354  BP: (!) 150/93  Pulse: (!) 59  Resp: 20  Temp: 98 F (36.7 C)  SpO2: 99%  Weight: 136 lb (61.7 kg)  Height: 6\' 1"  (1.854 m)    GENERAL: The patient is a well-nourished male, in no acute distress. The vital signs are documented above. CARDIAC: There is a regular rate and rhythm.  VASCULAR: I do not detect carotid bruits. He has palpable femoral pulses. I cannot palpate pedal pulses. PULMONARY: There is good air exchange bilaterally without wheezing or rales. ABDOMEN: Soft and non-tender with normal pitched bowel sounds.  His aneurysm is nontender. MUSCULOSKELETAL: There are no major deformities or cyanosis. NEUROLOGIC: No focal weakness or paresthesias are detected. SKIN: There are no ulcers or rashes noted. PSYCHIATRIC: The patient has a normal affect.  DATA:    CT ANGIO ABDOMEN PELVIS: I reviewed his  CT angio of the abdomen pelvis was done during his hospitalization in September.  He has a 5.6 cm infrarenal abdominal aortic aneurysm.  He does have some ectasia of the right common iliac artery which measures 2.0 cm in maximum diameter.  The left common iliac artery measures about 1.2 cm in maximum diameter.  He appears to have adequate access on both sides.  LABS: I reviewed his labs from 03/11/2021.  His GFR was greater than 60.  Hemoglobin 7.8.  Platelets 98,000  Deitra Mayo [facility] [phone]

## 2021-04-13 ENCOUNTER — Other Ambulatory Visit: Payer: Self-pay | Admitting: Student in an Organized Health Care Education/Training Program

## 2021-04-13 DIAGNOSIS — Z79891 Long term (current) use of opiate analgesic: Secondary | ICD-10-CM

## 2021-04-13 DIAGNOSIS — M25551 Pain in right hip: Secondary | ICD-10-CM

## 2021-04-13 MED ORDER — HYDROCODONE-ACETAMINOPHEN 7.5-325 MG PO TABS: 1.0000 | ORAL_TABLET | Freq: Two times a day (BID) | ORAL | 0 refills | Status: DC | PRN

## 2021-04-13 NOTE — Telephone Encounter (Signed)
Refill Request   HYDROcodone-acetaminophen (NORCO) 7.5-325 MG tablet   [id number] - [place], [place] - [address] [place] (Ph: [phone])

## 2021-04-24 ENCOUNTER — Other Ambulatory Visit: Payer: Self-pay | Admitting: Urology

## 2021-04-25 NOTE — Progress Notes (Signed)
Sent message, via epic in basket, requesting orders in epic from surgeon.  

## 2021-04-26 NOTE — Patient Instructions (Addendum)
DUE TO COVID-19 ONLY ONE VISITOR IS ALLOWED TO COME WITH YOU AND STAY IN THE WAITING ROOM ONLY DURING PRE OP AND PROCEDURE.   **NO VISITORS ARE ALLOWED IN THE SHORT STAY AREA OR RECOVERY ROOM!!**  IF YOU WILL BE ADMITTED INTO THE HOSPITAL YOU ARE ALLOWED ONLY TWO SUPPORT PEOPLE DURING VISITATION HOURS ONLY (10AM -8PM)   The support person(s) may change daily. The support person(s) must pass our screening, gel in and out, and wear a mask at all times, including in the patient's room. Patients must also wear a mask when staff or their support person are in the room.  No visitors under the age of 57. Any visitor under the age of 55 must be accompanied by an adult.    COVID test will be done morning of surgery when you arrive       Your procedure is scheduled on: 05/02/21   Report to [facility] Main Entrance    Report to admitting at 7:45 AM   Call this number if you have problems the morning of surgery [phone]   Do not eat food :After Midnight.   May have liquids until 7:45 AM day of surgery  CLEAR LIQUID DIET  Foods Allowed                                                                     Foods Excluded  Water, Black Coffee and tea (no milk or creamer)            liquids that you cannot  Plain Jell-O in any flavor  (No red)                                     see through such as: Fruit ices (not with fruit pulp)                                             milk, soups, orange juice              Iced Popsicles (No red)                                                 All solid food                                   Apple juices Sports drinks like Gatorade (No red) Lightly seasoned clear broth or consume(fat free) Sugar     Oral Hygiene is also important to reduce your risk of infection.                                    Remember - BRUSH YOUR TEETH THE MORNING OF SURGERY WITH YOUR REGULAR TOOTHPASTE   Do NOT smoke after Midnight  Take these medicines the  morning of surgery with A SIP OF WATER: Albuterol, Norco, Protonix, Crestor.             You may not have any metal on your body including jewelry, and body piercing             Do not wear lotions, powders, cologne, or deodorant              Men may shave face and neck.   Do not bring valuables to the hospital. [place].   Contacts, dentures or bridgework may not be worn into surgery.   Bring small overnight bag day of surgery.  Please read over the following fact sheets you were given: IF YOU HAVE QUESTIONS ABOUT YOUR PRE-OP INSTRUCTIONS PLEASE CALL [facility] - Preparing for Surgery Before surgery, you can play an important role.  Because skin is not sterile, your skin needs to be as free of germs as possible.  You can reduce the number of germs on your skin by washing with CHG (chlorahexidine gluconate) soap before surgery.  CHG is an antiseptic cleaner which kills germs and bonds with the skin to continue killing germs even after washing. Please DO NOT use if you have an allergy to CHG or antibacterial soaps.  If your skin becomes reddened/irritated stop using the CHG and inform your nurse when you arrive at Short Stay. Do not shave (including legs and underarms) for at least 48 hours prior to the first CHG shower.  You may shave your face/neck.  Please follow these instructions carefully:  1.  Shower with CHG Soap the night before surgery and the  morning of surgery.  2.  If you choose to wash your hair, wash your hair first as usual with your normal  shampoo.  3.  After you shampoo, rinse your hair and body thoroughly to remove the shampoo.                             4.  Use CHG as you would any other liquid soap.  You can apply chg directly to the skin and wash.  Gently with a scrungie or clean washcloth.  5.  Apply the CHG Soap to your body ONLY FROM THE NECK DOWN.   Do   not use on face/ open                            Wound or open sores. Avoid contact with eyes, ears mouth and   genitals (private parts).                       Wash face,  Genitals (private parts) with your normal soap.             6.  Wash thoroughly, paying special attention to the area where your    surgery  will be performed.  7.  Thoroughly rinse your body with warm water from the neck down.  8.  DO NOT shower/wash with your normal soap after using and rinsing off the CHG Soap.                9.  Pat yourself dry with a clean towel.  10.  Wear clean pajamas.            11.  Place clean sheets on your bed the night of your first shower and do not  sleep with pets. Day of Surgery : Do not apply any lotions/deodorants the morning of surgery.  Please wear clean clothes to the hospital/surgery center.  FAILURE TO FOLLOW THESE INSTRUCTIONS MAY RESULT IN THE CANCELLATION OF YOUR SURGERY  PATIENT SIGNATURE_________________________________  NURSE SIGNATURE__________________________________  ________________________________________________________________________

## 2021-04-26 NOTE — Progress Notes (Addendum)
COVID swab appointment: 05/02/21 DOS  COVID Vaccine Completed: yes x2 Date COVID Vaccine completed: 08/27/19, 09/20/19 Has received booster: COVID vaccine manufacturer: Pfizer       Date of COVID positive in last 90 days: no  PCP - Lalla Brothers, MD Cardiologist - Gwyndolyn Kaufman, MD  Chest x-ray - 11/06/20 Epic EKG - 03/08/21 Epic Stress Test - 12/19/20 Epic ECHO - 11/06/20 Epic Cardiac Cath - 2004 with stent x2 Pacemaker/ICD device last checked: n/a Spinal Cord Stimulator: n/a  Sleep Study - n/a CPAP -   Fasting Blood Sugar - pt denies DM, doesn't check BS at home or take DM meds Checks Blood Sugar _____ times a day  Blood Thinner Instructions: n/a Aspirin Instructions: Last Dose:  Activity level: Can go up a flight of stairs and perform activities of daily living without stopping and without symptoms of chest pain or shortness of breath. SOB with exertion    Anesthesia review: HTN, CAD, AAA, a fib, CKD, pancreatic mass, barrets esophagus, pre DM, lung cancer with RU lobectomy, hospitalized 03/06/21 for upper GI bleed  Patient denies shortness of breath, fever, cough and chest pain at PAT appointment   Patient verbalized understanding of instructions that were given to them at the PAT appointment. Patient was also instructed that they will need to review over the PAT instructions again at home before surgery.

## 2021-04-27 ENCOUNTER — Other Ambulatory Visit: Payer: Self-pay

## 2021-04-27 ENCOUNTER — Encounter (HOSPITAL_COMMUNITY)
Admission: RE | Admit: 2021-04-27 | Discharge: 2021-04-27 | Disposition: A | Discharge status: Home / Self Care | Payer: Medicare HMO | Source: Ambulatory Visit | Attending: Urology | Admitting: Urology

## 2021-04-27 ENCOUNTER — Encounter (HOSPITAL_COMMUNITY): Payer: Self-pay

## 2021-04-27 VITALS — BP 146/78 | HR 67 | Temp 98.1°F | Resp 18 | Ht 72.0 in | Wt 138.2 lb

## 2021-04-27 DIAGNOSIS — I714 Abdominal aortic aneurysm, without rupture, unspecified: Secondary | ICD-10-CM | POA: Insufficient documentation

## 2021-04-27 DIAGNOSIS — Z85118 Personal history of other malignant neoplasm of bronchus and lung: Secondary | ICD-10-CM | POA: Diagnosis not present

## 2021-04-27 DIAGNOSIS — R7303 Prediabetes: Secondary | ICD-10-CM | POA: Diagnosis not present

## 2021-04-27 DIAGNOSIS — I2510 Atherosclerotic heart disease of native coronary artery without angina pectoris: Secondary | ICD-10-CM | POA: Insufficient documentation

## 2021-04-27 DIAGNOSIS — Z955 Presence of coronary angioplasty implant and graft: Secondary | ICD-10-CM | POA: Diagnosis not present

## 2021-04-27 DIAGNOSIS — N189 Chronic kidney disease, unspecified: Secondary | ICD-10-CM | POA: Diagnosis not present

## 2021-04-27 DIAGNOSIS — N400 Enlarged prostate without lower urinary tract symptoms: Secondary | ICD-10-CM | POA: Diagnosis not present

## 2021-04-27 DIAGNOSIS — K219 Gastro-esophageal reflux disease without esophagitis: Secondary | ICD-10-CM | POA: Insufficient documentation

## 2021-04-27 DIAGNOSIS — F17210 Nicotine dependence, cigarettes, uncomplicated: Secondary | ICD-10-CM | POA: Diagnosis not present

## 2021-04-27 DIAGNOSIS — Z01812 Encounter for preprocedural laboratory examination: Secondary | ICD-10-CM | POA: Insufficient documentation

## 2021-04-27 DIAGNOSIS — Z01818 Encounter for other preprocedural examination: Secondary | ICD-10-CM

## 2021-04-27 DIAGNOSIS — I10 Essential (primary) hypertension: Secondary | ICD-10-CM

## 2021-04-27 DIAGNOSIS — I129 Hypertensive chronic kidney disease with stage 1 through stage 4 chronic kidney disease, or unspecified chronic kidney disease: Secondary | ICD-10-CM | POA: Insufficient documentation

## 2021-04-27 LAB — BASIC METABOLIC PANEL
Anion gap: 9 (ref 5–15)
BUN: 13 mg/dL (ref 8–23)
CO2: 25 mmol/L (ref 22–32)
Calcium: 9.2 mg/dL (ref 8.9–10.3)
Chloride: 105 mmol/L (ref 98–111)
Creatinine, Ser: 1.12 mg/dL (ref 0.61–1.24)
GFR, Estimated: >60 mL/min (ref >60)
Glucose, Bld: 90 mg/dL (ref 70–99)
Potassium: 3.4 mmol/L — ABNORMAL LOW (ref 3.5–5.1)
Sodium: 139 mmol/L (ref 135–145)

## 2021-04-27 LAB — CBC
HCT: 32.7 % — ABNORMAL LOW (ref 39.0–52.0)
Hemoglobin: 10.1 g/dL — ABNORMAL LOW (ref 13.0–17.0)
MCH: 29.7 pg (ref 26.0–34.0)
MCHC: 30.9 g/dL (ref 30.0–36.0)
MCV: 96.2 fL (ref 80.0–100.0)
Platelets: 132 10*3/uL — ABNORMAL LOW (ref 150–400)
RBC: 3.4 MIL/uL — ABNORMAL LOW (ref 4.22–5.81)
RDW: 16.1 % — ABNORMAL HIGH (ref 11.5–15.5)
WBC: 4.5 10*3/uL (ref 4.0–10.5)
nRBC: 0 % (ref 0.0–0.2)

## 2021-04-27 LAB — HEMOGLOBIN A1C
Hgb A1c MFr Bld: 5.5 % (ref 4.8–5.6)
Mean Plasma Glucose: 111.15 mg/dL

## 2021-04-27 LAB — GLUCOSE, CAPILLARY: Glucose-Capillary: 73 mg/dL (ref 70–99)

## 2021-04-30 NOTE — Anesthesia Preprocedure Evaluation (Deleted)
Anesthesia Evaluation    Airway        Dental   Pulmonary Current Smoker and Patient abstained from smoking.,           Cardiovascular hypertension,      Neuro/Psych    GI/Hepatic   Endo/Other    Renal/GU      Musculoskeletal   Abdominal   Peds  Hematology   Anesthesia Other Findings   Reproductive/Obstetrics                            Anesthesia Physical Anesthesia Plan  ASA:   Anesthesia Plan:    Post-op Pain Management:    Induction:   PONV Risk Score and Plan:   Airway Management Planned:   Additional Equipment:   Intra-op Plan:   Post-operative Plan:   Informed Consent:   Plan Discussed with:   Anesthesia Plan Comments: (See PAT note 04/27/21, Konrad Felix Ward, PA-C)        Anesthesia Quick Evaluation                                  Anesthesia Evaluation  Patient identified by MRN, date of birth, ID band Patient awake    Reviewed: Allergy & Precautions, NPO status , Patient's Chart, lab work & pertinent test results  History of Anesthesia Complications Negative for: history of anesthetic complications  Airway Mallampati: II  TM Distance: >3 FB Neck ROM: Full    Dental  (+) Edentulous Upper, Edentulous Lower   Pulmonary Current Smoker and Patient abstained from smoking.,   NSCLC s/p lobectomy     Pulmonary exam normal        Cardiovascular hypertension, + CAD, + Past MI, + Cardiac Stents and +CHF  + dysrhythmias  Rhythm:Regular Rate:Tachycardia   '22 Myoperfusion - Nuclear stress EF: 47%. The left ventricular ejection fraction is mildly decreased (45-54%). There was no ST segment deviation noted during stress. No T wave inversion was noted during stress. The study is normal. This is a low risk study.     Neuro/Psych negative neurological ROS  negative psych ROS   GI/Hepatic hiatal hernia, GERD  ,(+)     substance  abuse  alcohol use,   Endo/Other   Pre-DM   Renal/GU CRFRenal disease     Musculoskeletal  (+) Arthritis ,  Gout    Abdominal   Peds  Hematology  (+) anemia ,  On xarelto Plt 112k    Anesthesia Other Findings   Reproductive/Obstetrics                          Anesthesia Physical Anesthesia Plan  ASA: 3  Anesthesia Plan: General   Post-op Pain Management:    Induction: Intravenous and Rapid sequence  PONV Risk Score and Plan: 1 and Treatment may vary due to age or medical condition and Ondansetron  Airway Management Planned: Oral ETT  Additional Equipment: None  Intra-op Plan:   Post-operative Plan: Extubation in OR  Informed Consent: I have reviewed the patients History and Physical, chart, labs and discussed the procedure including the risks, benefits and alternatives for the proposed anesthesia with the patient or authorized representative who has indicated his/her understanding and acceptance.     Dental advisory given  Plan Discussed with: CRNA and Anesthesiologist  Anesthesia Plan Comments:       Anesthesia  Quick Evaluation

## 2021-04-30 NOTE — Progress Notes (Signed)
Anesthesia Chart Review   Case: [id number] Date/Time: 05/02/21 1030   Procedure: TRANSURETHRAL RESECTION OF THE PROSTATE (TURP)   Anesthesia type: General   Pre-op diagnosis: BENIGN PROSTATE HYPERPLASIA   Location: [place] [facility]   Surgeons: Lucas Mallow, MD       DISCUSSION:75 y.o. every day smoker with h/o HTN, GERD, AAA, lung cancer s/p RUL lobectomy, CAD (stent), a-fib, CKD, BPH scheduled for above procedure 05/02/2021 with Dr. Link Snuffer.   Pt with 5.6cm AAA. Last seen by vascular surgeon 04/12/2021.  Will need elective repair per Dr. Scot Dock. Per note "Has had problems related to his indwelling Foley including a E. coli UTI.  I think it may be best to get this issue resolved before placing a prosthetic graft."  Pt last seen by cardiology 02/22/2021. Per OV note stable with no anginal symptoms, no indication for ischemic evaluation.    Started on Xarelto 03/05/2021 by PCP due to a-fib.  Admitted 9/20-9/25/2022 with GI bleed, Xarelto stopped. Pt received 3 units PRBCs, hemoglobin stable at discharge.  Hemoglobin 10.1 at PAT visit 04/27/21.   Pt with acute delirium during admission, attributed to alcohol withdrawal, gradually improved during admission.   Anticipate pt can proceed with planned procedure barring acute status change and after evaluation DOS>  VS: BP (!) 146/78   Pulse 67   Temp 36.7 C (Oral)   Resp 18   Ht 6' (1.829 m)   Wt 62.7 kg   SpO2 100%   BMI 18.75 kg/m   PROVIDERS: Axel Filler, MD is PCP   Gwyndolyn Kaufman, MD is Cardiologist  LABS: Labs reviewed: Acceptable for surgery. (all labs ordered are listed, but only abnormal results are displayed)  Labs Reviewed  BASIC METABOLIC PANEL - Abnormal; Notable for the following components:      Result Value   Potassium 3.4 (*)    All other components within normal limits  CBC - Abnormal; Notable for the following components:   RBC 3.40 (*)    Hemoglobin 10.1 (*)    HCT 32.7 (*)     RDW 16.1 (*)    Platelets 132 (*)    All other components within normal limits  HEMOGLOBIN A1C  GLUCOSE, CAPILLARY     IMAGES:   EKG: 03/08/21 Rate 76 bpm  Atrial fibrillation with premature ventricular or aberrantly conducted complexes Left axis deviation Incomplete right bundle branch block Nonspecific T wave abnormality Abnormal ECG  CV: Stress Test 12/19/2020 Nuclear stress EF: 47%. The left ventricular ejection fraction is mildly decreased (45-54%). There was no ST segment deviation noted during stress. No T wave inversion was noted during stress. The study is normal. This is a low risk study.   1.  Reduced apical counts with normal wall motion consistent with apical thinning artifact.  No evidence of ischemia or infarction. 2.  Normal LVEF for this modality, 47%. 3.  This is a low risk study.  Echo 11/06/2020  1. Left ventricular ejection fraction, by estimation, is 55 to 60%. The  left ventricle has normal function. Left ventricular endocardial border  not optimally defined to evaluate regional wall motion. There is mild left  ventricular hypertrophy. Left  ventricular diastolic parameters are consistent with Grade I diastolic  dysfunction (impaired relaxation).   2. Right ventricular systolic function was not well visualized. The right  ventricular size is not well visualized. There is normal pulmonary artery  systolic pressure. The estimated right ventricular systolic pressure is  29.8  mmHg.   3. The mitral valve is normal in structure. No evidence of mitral valve  regurgitation. No evidence of mitral stenosis.   4. The aortic valve is tricuspid. Aortic valve regurgitation is not  visualized. No aortic stenosis is present.   5. The inferior vena cava is normal in size with greater than 50%  respiratory variability, suggesting right atrial pressure of 3 mmHg.   6. Technically difficult study with poor acoustic windows.  Past Medical History:  Diagnosis Date    AAA (abdominal aortic aneurysm)    followed by vasc surgery   Alcohol abuse    Barretts esophagus    bx distal esophagus 2011 neg    Bladder outlet obstruction 04/02/2018   CAD in native artery    a. s/p stent to prox RCA and mid Cx.   Cataract    Cholelithiasis    Chronic osteomyelitis involving ankle and foot, right ([place]) 07/13/2018   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   2006 - 3 adenomas largest 12 mm  2008 - no polyps 2013-  2 mm cecal polyp, adenoma, routine repeat colonoscopy 02/2017 approx 05/14/2017 18 polyps max 12 mm ALL adenomas recall 2019 [name] Mayer, MD, [facility]      Diverticulosis of colon    DJD (degenerative joint disease)    left hip   Full dentures    upper and lower   GERD (gastroesophageal reflux disease)    diet controlled    Gout    feet   Hiatal hernia    2 cm noted on upper endos 2011    Hx of adenomatous colonic polyps    Hyperlipidemia    Hypertension    Internal hemorrhoid    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 pt was on Plavix x 3 years then transitioned to ASA, no problems since   Non-small cell carcinoma of right lung, stage 1 ([place]) 01/30/2016   Renal failure, acute ([facility])    hx of 2/2 medication, resolved   Tobacco abuse    smoker .5 ppd x 55 yrs   UTI (lower urinary tract infection)     Past Surgical History:  Procedure Laterality Date   BIOPSY  11/12/2020   Procedure: BIOPSY;  Surgeon: Doran Stabler, MD;  Location: [place];  Service: Gastroenterology;;   cataract surgery Bilateral 2018   removal of cataracts   COLONOSCOPY     hx polyps - [name] 02/2012   COLONOSCOPY WITH PROPOFOL N/A 11/12/2020   Procedure: COLONOSCOPY WITH PROPOFOL;  Surgeon: Doran Stabler, MD;  Location: [place];  Service: Gastroenterology;  Laterality: N/A;   CORONARY ANGIOPLASTY WITH STENT PLACEMENT     ESOPHAGOGASTRODUODENOSCOPY  (EGD) WITH PROPOFOL N/A 11/12/2020   Procedure: ESOPHAGOGASTRODUODENOSCOPY (EGD) WITH PROPOFOL;  Surgeon: Doran Stabler, MD;  Location: [place];  Service: Gastroenterology;  Laterality: N/A;   ESOPHAGOGASTRODUODENOSCOPY (EGD) WITH PROPOFOL N/A 03/06/2021   Procedure: ESOPHAGOGASTRODUODENOSCOPY (EGD) WITH PROPOFOL;  Surgeon: Yetta Flock, MD;  Location: [facility];  Service: Gastroenterology;  Laterality: N/A;   flexible cystourethroscopy  05/24/06   HEMOSTASIS CLIP PLACEMENT  03/06/2021   Procedure: HEMOSTASIS CLIP PLACEMENT;  Surgeon: Yetta Flock, MD;  Location: [facility];  Service: Gastroenterology;;   HOT HEMOSTASIS N/A 03/06/2021   Procedure: HOT HEMOSTASIS (ARGON PLASMA COAGULATION/BICAP);  Surgeon: Yetta Flock, MD;  Location: [facility];  Service: Gastroenterology;  Laterality: N/A;   INGUINAL HERNIA REPAIR  2005   LUNG LOBECTOMY Right 11/07   RUL   MULTIPLE TOOTH EXTRACTIONS     POLYPECTOMY  11/12/2020   Procedure: POLYPECTOMY;  Surgeon: Doran Stabler, MD;  Location: [place];  Service: Gastroenterology;;   UPPER GASTROINTESTINAL ENDOSCOPY      MEDICATIONS:  albuterol (VENTOLIN HFA) 108 (90 Base) MCG/ACT inhaler   HYDROcodone-acetaminophen (NORCO) 7.5-325 MG tablet   Multiple Vitamin (MULTIVITAMIN WITH MINERALS) TABS tablet   pantoprazole (PROTONIX) 40 MG tablet   rosuvastatin (CRESTOR) 20 MG tablet   tamsulosin (FLOMAX) 0.4 MG CAPS capsule   ZENPEP 20000-63000 units CPEP   No current facility-administered medications for this encounter.     Konrad Felix Ward, PA-C [facility] [phone]

## 2021-05-02 ENCOUNTER — Encounter (HOSPITAL_COMMUNITY): Payer: Self-pay | Admitting: Urology

## 2021-05-02 ENCOUNTER — Observation Stay (HOSPITAL_COMMUNITY)
Admission: RE | Admit: 2021-05-02 | Discharge: 2021-05-04 | Disposition: A | Discharge status: Home / Self Care | Payer: Medicare HMO | Source: Ambulatory Visit | Attending: Urology | Admitting: Urology

## 2021-05-02 ENCOUNTER — Ambulatory Visit (HOSPITAL_COMMUNITY): Payer: Medicare HMO | Admitting: Physician Assistant

## 2021-05-02 ENCOUNTER — Encounter (HOSPITAL_COMMUNITY)
Admission: RE | Disposition: A | Discharge status: Home / Self Care | Payer: Self-pay | Source: Ambulatory Visit | Attending: Urology

## 2021-05-02 ENCOUNTER — Ambulatory Visit (HOSPITAL_COMMUNITY): Payer: Medicare HMO | Admitting: Anesthesiology

## 2021-05-02 ENCOUNTER — Other Ambulatory Visit: Payer: Self-pay

## 2021-05-02 DIAGNOSIS — F17210 Nicotine dependence, cigarettes, uncomplicated: Secondary | ICD-10-CM | POA: Insufficient documentation

## 2021-05-02 DIAGNOSIS — Z20822 Contact with and (suspected) exposure to covid-19: Secondary | ICD-10-CM | POA: Diagnosis not present

## 2021-05-02 DIAGNOSIS — C61 Malignant neoplasm of prostate: Secondary | ICD-10-CM | POA: Diagnosis not present

## 2021-05-02 DIAGNOSIS — N138 Other obstructive and reflux uropathy: Secondary | ICD-10-CM | POA: Diagnosis not present

## 2021-05-02 DIAGNOSIS — Z8679 Personal history of other diseases of the circulatory system: Secondary | ICD-10-CM | POA: Insufficient documentation

## 2021-05-02 DIAGNOSIS — I10 Essential (primary) hypertension: Secondary | ICD-10-CM | POA: Diagnosis not present

## 2021-05-02 DIAGNOSIS — Z833 Family history of diabetes mellitus: Secondary | ICD-10-CM | POA: Diagnosis not present

## 2021-05-02 DIAGNOSIS — N400 Enlarged prostate without lower urinary tract symptoms: Secondary | ICD-10-CM | POA: Diagnosis present

## 2021-05-02 DIAGNOSIS — Z85118 Personal history of other malignant neoplasm of bronchus and lung: Secondary | ICD-10-CM | POA: Diagnosis not present

## 2021-05-02 DIAGNOSIS — Z79899 Other long term (current) drug therapy: Secondary | ICD-10-CM | POA: Insufficient documentation

## 2021-05-02 DIAGNOSIS — N401 Enlarged prostate with lower urinary tract symptoms: Secondary | ICD-10-CM | POA: Diagnosis not present

## 2021-05-02 DIAGNOSIS — N320 Bladder-neck obstruction: Secondary | ICD-10-CM | POA: Diagnosis not present

## 2021-05-02 DIAGNOSIS — R338 Other retention of urine: Secondary | ICD-10-CM | POA: Diagnosis not present

## 2021-05-02 DIAGNOSIS — I700 Atherosclerosis of aorta: Secondary | ICD-10-CM | POA: Diagnosis not present

## 2021-05-02 HISTORY — PX: TRANSURETHRAL RESECTION OF PROSTATE: SHX73

## 2021-05-02 LAB — GLUCOSE, CAPILLARY: Glucose-Capillary: 91 mg/dL (ref 70–99)

## 2021-05-02 LAB — SARS CORONAVIRUS 2 BY RT PCR (HOSPITAL ORDER, PERFORMED IN ~~LOC~~ HOSPITAL LAB): SARS Coronavirus 2: NEGATIVE

## 2021-05-02 SURGERY — TURP (TRANSURETHRAL RESECTION OF PROSTATE)
Anesthesia: General | Site: Prostate

## 2021-05-02 MED ORDER — DIPHENHYDRAMINE HCL 12.5 MG/5ML PO ELIX: 12.5000 mg | ORAL_SOLUTION | Freq: Four times a day (QID) | ORAL | Status: DC | PRN

## 2021-05-02 MED ORDER — FENTANYL CITRATE (PF) 100 MCG/2ML IJ SOLN
INTRAMUSCULAR | Status: DC | PRN
  Administered 2021-05-02: 50 ug via INTRAVENOUS
  Administered 2021-05-02 (×6): 25 ug via INTRAVENOUS

## 2021-05-02 MED ORDER — LACTATED RINGERS IV SOLN: INTRAVENOUS | Status: DC

## 2021-05-02 MED ORDER — FENTANYL CITRATE PF 50 MCG/ML IJ SOSY
PREFILLED_SYRINGE | INTRAMUSCULAR | Status: AC
  Filled 2021-05-02: qty 1

## 2021-05-02 MED ORDER — CEFAZOLIN SODIUM-DEXTROSE 2-4 GM/100ML-% IV SOLN
2.0000 g | INTRAVENOUS | Status: AC
  Administered 2021-05-02: 2 g via INTRAVENOUS
  Filled 2021-05-02: qty 100

## 2021-05-02 MED ORDER — CHLORHEXIDINE GLUCONATE 0.12 % MT SOLN
15.0000 mL | Freq: Once | OROMUCOSAL | Status: AC
  Administered 2021-05-02: 15 mL via OROMUCOSAL

## 2021-05-02 MED ORDER — ONDANSETRON HCL 4 MG/2ML IJ SOLN
4.0000 mg | INTRAMUSCULAR | Status: DC | PRN
  Administered 2021-05-03: 09:00:00 4 mg via INTRAVENOUS
  Filled 2021-05-02: qty 2

## 2021-05-02 MED ORDER — ALBUTEROL SULFATE HFA 108 (90 BASE) MCG/ACT IN AERS: 1.0000 | INHALATION_SPRAY | Freq: Four times a day (QID) | RESPIRATORY_TRACT | Status: DC | PRN

## 2021-05-02 MED ORDER — GLYCOPYRROLATE 0.2 MG/ML IJ SOLN
INTRAMUSCULAR | Status: AC
  Filled 2021-05-02: qty 1

## 2021-05-02 MED ORDER — CEFAZOLIN SODIUM-DEXTROSE 2-4 GM/100ML-% IV SOLN
2.0000 g | Freq: Three times a day (TID) | INTRAVENOUS | Status: AC
  Administered 2021-05-02 – 2021-05-03 (×2): 2 g via INTRAVENOUS
  Filled 2021-05-02 (×2): qty 100

## 2021-05-02 MED ORDER — ONDANSETRON HCL 4 MG/2ML IJ SOLN
INTRAMUSCULAR | Status: DC | PRN
  Administered 2021-05-02: 4 mg via INTRAVENOUS

## 2021-05-02 MED ORDER — LIDOCAINE HCL (CARDIAC) PF 100 MG/5ML IV SOSY: PREFILLED_SYRINGE | INTRAVENOUS | Status: DC | PRN

## 2021-05-02 MED ORDER — ORAL CARE MOUTH RINSE: 15.0000 mL | Freq: Once | OROMUCOSAL | Status: AC

## 2021-05-02 MED ORDER — OXYCODONE HCL 5 MG PO TABS: 5.0000 mg | ORAL_TABLET | Freq: Once | ORAL | Status: DC | PRN

## 2021-05-02 MED ORDER — SODIUM CHLORIDE 0.9 % IV SOLN: INTRAVENOUS | Status: DC

## 2021-05-02 MED ORDER — FENTANYL CITRATE (PF) 100 MCG/2ML IJ SOLN
INTRAMUSCULAR | Status: AC
  Filled 2021-05-02: qty 2

## 2021-05-02 MED ORDER — EPHEDRINE SULFATE 50 MG/ML IJ SOLN
INTRAMUSCULAR | Status: DC | PRN
  Administered 2021-05-02 (×2): 5 mg via INTRAVENOUS

## 2021-05-02 MED ORDER — SODIUM CHLORIDE 0.9 % IR SOLN
3000.0000 mL | Status: DC
  Administered 2021-05-02 – 2021-05-04 (×6): 3000 mL

## 2021-05-02 MED ORDER — ROSUVASTATIN CALCIUM 20 MG PO TABS
20.0000 mg | ORAL_TABLET | Freq: Every day | ORAL | Status: DC
  Administered 2021-05-03 – 2021-05-04 (×2): 20 mg via ORAL
  Filled 2021-05-02 (×2): qty 1

## 2021-05-02 MED ORDER — EPHEDRINE 5 MG/ML INJ
INTRAVENOUS | Status: AC
  Filled 2021-05-02: qty 5

## 2021-05-02 MED ORDER — SODIUM CHLORIDE 0.9 % IR SOLN
Status: DC | PRN
  Administered 2021-05-02 (×4): 6000 mL via INTRAVESICAL

## 2021-05-02 MED ORDER — ENSURE ENLIVE PO LIQD
237.0000 mL | Freq: Two times a day (BID) | ORAL | Status: DC
  Administered 2021-05-03 (×2): 237 mL via ORAL

## 2021-05-02 MED ORDER — HYDROMORPHONE HCL 1 MG/ML IJ SOLN
0.5000 mg | INTRAMUSCULAR | Status: DC | PRN
  Administered 2021-05-02 – 2021-05-03 (×4): 1 mg via INTRAVENOUS
  Filled 2021-05-02 (×4): qty 1

## 2021-05-02 MED ORDER — ZOLPIDEM TARTRATE 5 MG PO TABS: 5.0000 mg | ORAL_TABLET | Freq: Every evening | ORAL | Status: DC | PRN

## 2021-05-02 MED ORDER — ACETAMINOPHEN 500 MG PO TABS
ORAL_TABLET | ORAL | Status: AC
  Filled 2021-05-02: qty 2

## 2021-05-02 MED ORDER — 0.9 % SODIUM CHLORIDE (POUR BTL) OPTIME
TOPICAL | Status: DC | PRN
  Administered 2021-05-02: 1000 mL

## 2021-05-02 MED ORDER — PROPOFOL 10 MG/ML IV BOLUS
INTRAVENOUS | Status: DC | PRN
  Administered 2021-05-02: 100 mg via INTRAVENOUS

## 2021-05-02 MED ORDER — ALBUTEROL SULFATE (2.5 MG/3ML) 0.083% IN NEBU: 2.5000 mg | INHALATION_SOLUTION | Freq: Four times a day (QID) | RESPIRATORY_TRACT | Status: DC | PRN

## 2021-05-02 MED ORDER — PANCRELIPASE (LIP-PROT-AMYL) 20000-63000 UNITS PO CPEP: 3.0000 | ORAL_CAPSULE | Freq: Three times a day (TID) | ORAL | Status: DC

## 2021-05-02 MED ORDER — PROPOFOL 10 MG/ML IV BOLUS
INTRAVENOUS | Status: AC
  Filled 2021-05-02: qty 20

## 2021-05-02 MED ORDER — DEXAMETHASONE SODIUM PHOSPHATE 4 MG/ML IJ SOLN
INTRAMUSCULAR | Status: DC | PRN
  Administered 2021-05-02: 4 mg via INTRAVENOUS

## 2021-05-02 MED ORDER — OXYCODONE HCL 5 MG/5ML PO SOLN: 5.0000 mg | Freq: Once | ORAL | Status: DC | PRN

## 2021-05-02 MED ORDER — BACITRACIN-NEOMYCIN-POLYMYXIN 400-5-5000 EX OINT: 1.0000 "application " | TOPICAL_OINTMENT | Freq: Three times a day (TID) | CUTANEOUS | Status: DC | PRN

## 2021-05-02 MED ORDER — FENTANYL CITRATE PF 50 MCG/ML IJ SOSY
25.0000 ug | PREFILLED_SYRINGE | INTRAMUSCULAR | Status: DC | PRN
  Administered 2021-05-02: 50 ug via INTRAVENOUS
  Administered 2021-05-02 (×2): 25 ug via INTRAVENOUS
  Administered 2021-05-02: 50 ug via INTRAVENOUS

## 2021-05-02 MED ORDER — ACETAMINOPHEN 325 MG PO TABS: 650.0000 mg | ORAL_TABLET | ORAL | Status: DC | PRN

## 2021-05-02 MED ORDER — DIPHENHYDRAMINE HCL 50 MG/ML IJ SOLN: 12.5000 mg | Freq: Four times a day (QID) | INTRAMUSCULAR | Status: DC | PRN

## 2021-05-02 MED ORDER — PANTOPRAZOLE SODIUM 40 MG PO TBEC
40.0000 mg | DELAYED_RELEASE_TABLET | Freq: Every day | ORAL | Status: DC
  Administered 2021-05-03 – 2021-05-04 (×2): 40 mg via ORAL
  Filled 2021-05-02 (×2): qty 1

## 2021-05-02 MED ORDER — PANCRELIPASE (LIP-PROT-AMYL) 12000-38000 UNITS PO CPEP
60000.0000 [IU] | ORAL_CAPSULE | Freq: Three times a day (TID) | ORAL | Status: DC
  Administered 2021-05-02 – 2021-05-04 (×5): 60000 [IU] via ORAL
  Filled 2021-05-02 (×6): qty 5

## 2021-05-02 MED ORDER — LIDOCAINE 2% (20 MG/ML) 5 ML SYRINGE
INTRAMUSCULAR | Status: DC | PRN
  Administered 2021-05-02: 50 mg via INTRAVENOUS

## 2021-05-02 MED ORDER — ACETAMINOPHEN 500 MG PO TABS
1000.0000 mg | ORAL_TABLET | Freq: Once | ORAL | Status: AC
  Administered 2021-05-02: 1000 mg via ORAL

## 2021-05-02 MED ORDER — SENNOSIDES-DOCUSATE SODIUM 8.6-50 MG PO TABS
2.0000 | ORAL_TABLET | Freq: Every day | ORAL | Status: DC
  Administered 2021-05-02 – 2021-05-03 (×2): 2 via ORAL
  Filled 2021-05-02 (×2): qty 2

## 2021-05-02 MED ORDER — MIDAZOLAM HCL 2 MG/2ML IJ SOLN
1.0000 mg | Freq: Once | INTRAMUSCULAR | Status: AC
  Administered 2021-05-02: 1 mg via INTRAVENOUS

## 2021-05-02 MED ORDER — ONDANSETRON HCL 4 MG/2ML IJ SOLN: 4.0000 mg | Freq: Once | INTRAMUSCULAR | Status: DC | PRN

## 2021-05-02 MED ORDER — MIDAZOLAM HCL 2 MG/2ML IJ SOLN
INTRAMUSCULAR | Status: AC
  Filled 2021-05-02: qty 2

## 2021-05-02 MED ORDER — OXYCODONE HCL 5 MG PO TABS
5.0000 mg | ORAL_TABLET | ORAL | Status: DC | PRN
  Administered 2021-05-02 – 2021-05-03 (×2): 5 mg via ORAL
  Filled 2021-05-02 (×2): qty 1

## 2021-05-02 SURGICAL SUPPLY — 20 items
BAG DRN RND TRDRP ANRFLXCHMBR (UROLOGICAL SUPPLIES) ×1
BAG URINE DRAIN 2000ML AR STRL (UROLOGICAL SUPPLIES) ×2 IMPLANT
BAG URO CATCHER STRL LF (MISCELLANEOUS) ×2 IMPLANT
CATH FOLEY 3WAY 30CC 24FR (CATHETERS) ×2
CATH URTH STD 24FR FL 3W 2 (CATHETERS) ×1 IMPLANT
CLOTH BEACON ORANGE TIMEOUT ST (SAFETY) ×2 IMPLANT
DRAPE FOOT SWITCH (DRAPES) ×2 IMPLANT
GLOVE SURG ENC MOIS LTX SZ7.5 (GLOVE) ×6 IMPLANT
GOWN STRL REUS W/TWL XL LVL3 (GOWN DISPOSABLE) ×4 IMPLANT
HOLDER FOLEY CATH W/STRAP (MISCELLANEOUS) ×2 IMPLANT
KIT TURNOVER KIT A (KITS) ×2 IMPLANT
LOOP CUT BIPOLAR 24F LRG (ELECTROSURGICAL) ×2 IMPLANT
MANIFOLD NEPTUNE II (INSTRUMENTS) ×2 IMPLANT
PACK CYSTO (CUSTOM PROCEDURE TRAY) ×2 IMPLANT
PENCIL SMOKE EVACUATOR (MISCELLANEOUS) IMPLANT
SYR 30ML LL (SYRINGE) ×4 IMPLANT
SYR TOOMEY IRRIG 70ML (MISCELLANEOUS) ×2
SYRINGE TOOMEY IRRIG 70ML (MISCELLANEOUS) ×1 IMPLANT
TUBING CONNECTING 10 (TUBING) ×2 IMPLANT
TUBING UROLOGY SET (TUBING) ×2 IMPLANT

## 2021-05-02 NOTE — Anesthesia Preprocedure Evaluation (Addendum)
Anesthesia Evaluation  Patient identified by MRN, date of birth, ID band Patient awake    Reviewed: Allergy & Precautions, NPO status , Patient's Chart, lab work & pertinent test results  History of Anesthesia Complications Negative for: history of anesthetic complications  Airway Mallampati: I  TM Distance: >3 FB Neck ROM: Full    Dental  (+) Edentulous Upper, Edentulous Lower   Pulmonary Current Smoker and Patient abstained from smoking.,   NSCLC s/p lobectomy     Pulmonary exam normal        Cardiovascular hypertension, + CAD, + Past MI, + Cardiac Stents and +CHF  Normal cardiovascular exam+ dysrhythmias    '22 Myoperfusion - Nuclear stress EF: 47%. The left ventricular ejection fraction is mildly decreased (45-54%). There was no ST segment deviation noted during stress. No T wave inversion was noted during stress. The study is normal. This is a low risk study.     Neuro/Psych negative neurological ROS  negative psych ROS   GI/Hepatic hiatal hernia, GERD  Medicated and Controlled,(+)     substance abuse  alcohol use,   Endo/Other   Pre-DM   Renal/GU CRFRenal disease     Musculoskeletal  (+) Arthritis ,  Gout    Abdominal   Peds  Hematology  (+) anemia ,  Plt 132k    Anesthesia Other Findings See PAT note  Reproductive/Obstetrics                            Anesthesia Physical  Anesthesia Plan  ASA: 3  Anesthesia Plan: General   Post-op Pain Management:    Induction: Intravenous  PONV Risk Score and Plan: 2 and Treatment may vary due to age or medical condition and Ondansetron  Airway Management Planned: Oral ETT  Additional Equipment: None  Intra-op Plan:   Post-operative Plan: Extubation in OR  Informed Consent: I have reviewed the patients History and Physical, chart, labs and discussed the procedure including the risks, benefits and alternatives for  the proposed anesthesia with the patient or authorized representative who has indicated his/her understanding and acceptance.     Dental advisory given  Plan Discussed with: CRNA and Anesthesiologist  Anesthesia Plan Comments:        Anesthesia Quick Evaluation

## 2021-05-02 NOTE — Anesthesia Procedure Notes (Signed)
Procedure Name: LMA Insertion Date/Time: 05/02/2021 11:11 AM Performed by: Lavina Hamman, CRNA Pre-anesthesia Checklist: Patient identified, Emergency Drugs available, Suction available and Patient being monitored Patient Re-evaluated:Patient Re-evaluated prior to induction Oxygen Delivery Method: Circle System Utilized Preoxygenation: Pre-oxygenation with 100% oxygen Induction Type: IV induction Ventilation: Mask ventilation without difficulty LMA: LMA with gastric port inserted LMA Size: 4.0 Number of attempts: 1 Airway Equipment and Method: Bite block Placement Confirmation: positive ETCO2 Tube secured with: Tape Dental Injury: Teeth and Oropharynx as per pre-operative assessment

## 2021-05-02 NOTE — Anesthesia Postprocedure Evaluation (Signed)
Anesthesia Post Note  Patient: [name]  Procedure(s) Performed: TRANSURETHRAL RESECTION OF THE PROSTATE (TURP) (Prostate)     Patient location during evaluation: PACU Anesthesia Type: General Level of consciousness: awake and alert and patient cooperative Pain management: pain level controlled Vital Signs Assessment: post-procedure vital signs reviewed and stable Respiratory status: spontaneous breathing, nonlabored ventilation and respiratory function stable Cardiovascular status: blood pressure returned to baseline and stable Postop Assessment: no apparent nausea or vomiting Anesthetic complications: no   No notable events documented.  Last Vitals:  Vitals:   05/02/21 1345 05/02/21 1400  BP: (!) 173/100 (!) 167/101  Pulse: (!) 113 (!) 121  Resp: 19 (!) 25  Temp:  36.5 C  SpO2: 99% 97%    Last Pain:  Vitals:   05/02/21 1245  TempSrc:   PainSc: 0-No pain                 Delorice Bannister,E. Javonte Elenes

## 2021-05-02 NOTE — Transfer of Care (Signed)
Immediate Anesthesia Transfer of Care Note  Patient: [name]  Procedure(s) Performed: Procedure(s): TRANSURETHRAL RESECTION OF THE PROSTATE (TURP) (N/A)  Patient Location: PACU  Anesthesia Type:General  Level of Consciousness:  sedated, patient cooperative and responds to stimulation  Airway & Oxygen Therapy:Patient Spontanous Breathing and Patient connected to face mask oxgen  Post-op Assessment:  Report given to PACU RN and Post -op Vital signs reviewed and stable  Post vital signs:  Reviewed and stable  Last Vitals:  Vitals:   05/02/21 0816  BP: (!) 152/85  Pulse: 78  Resp: 16  Temp: 36.7 C  SpO2: 411%    Complications: No apparent anesthesia complications

## 2021-05-02 NOTE — Plan of Care (Signed)

## 2021-05-02 NOTE — Op Note (Signed)
Preoperative diagnosis: Bladder outlet obstruction secondary to BPH  Postoperative diagnosis:  Bladder outlet obstruction secondary to BPH  Procedure:  Cystoscopy Transurethral resection of the prostate  Surgeon: Marton Redwood, III. M.D.  Anesthesia: General  Complications: None  EBL: Minimal  Specimens: Prostate chips  Indication: [name] is a patient with bladder outlet obstruction secondary to benign prostatic hyperplasia. After reviewing the management options for treatment, he elected to proceed with the above surgical procedure(s). We have discussed the potential benefits and risks of the procedure, side effects of the proposed treatment, the likelihood of the patient achieving the goals of the procedure, and any potential problems that might occur during the procedure or recuperation. Informed consent has been obtained.  Description of procedure:  The patient was taken to the operating room and general anesthesia was induced.  The patient was placed in the dorsal lithotomy position, prepped and draped in the usual sterile fashion, and preoperative antibiotics were administered. A preoperative time-out was performed.   Cystourethroscopy was performed.  The patient's urethra was examined and demonstrated bilobar prostatic hypertrophy with a median lobe.   The bladder was then systematically examined in its entirety. There was no evidence of any bladder tumors, stones, or other mucosal pathology.  The ureteral orifices were identified and marked so as to be avoided during the procedure.  The prostate adenoma was then resected utilizing loop cautery resection with the bipolar cutting loop.  The prostate adenoma from the bladder neck back to the verumontanum was resected beginning at the six o'clock position and then extended to include the right and left lobes of the prostate and anterior prostate. Care was taken not to resect distal to the verumontanum.  Hemostasis was  then achieved with the cautery and the bladder was emptied and reinspected with no significant bleeding noted at the end of the procedure.    A 59F 3 way catheter was then placed into the bladder.  The patient appeared to tolerate the procedure well and without complications.  The patient was able to be awakened and transferred to the recovery unit in satisfactory condition.

## 2021-05-02 NOTE — Discharge Instructions (Signed)
Transurethral Resection of the Prostate (TURP) or Greenlight laser ablation of the Prostate ° °Care After ° °Refer to this sheet in the next few weeks. These discharge instructions provide you with general information on caring for yourself after you leave the hospital. Your caregiver may also give you specific instructions. Your treatment has been planned according to the most current medical practices available, but unavoidable complications sometimes occur. If you have any problems or questions after discharge, please call your caregiver. ° °HOME CARE INSTRUCTIONS  ° °Medications °· You may receive medicine for pain management. As your level of discomfort decreases, adjustments in your pain medicines may be made.  °· Take all medicines as directed.  °· You may be given a medicine (antibiotic) to kill germs following surgery. Finish all medicines. Let your caregiver know if you have any side effects or problems from the medicine.  °· If you are on aspirin, it would be best not to restart the aspirin until the blood in the urine clears °Hygiene °· You can take a shower after surgery.  °· You should not take a bath while you still have the urethral catheter. °Activity °· You will be encouraged to get out of bed as much as possible and increase your activity level as tolerated.  °· Spend the first week in and around your home. For 3 weeks, avoid the following:  °· Straining.  °· Running.  °· Strenuous work.  °· Walks longer than a few blocks.  °· Riding for extended periods.  °· Sexual relations.  °· Do not lift heavy objects (more than 20 pounds) for at least 1 month. When lifting, use your arms instead of your abdominal muscles.  °· You will be encouraged to walk as tolerated. Do not exert yourself. Increase your activity level slowly. Remember that it is important to keep moving after an operation of any type. This cuts down on the possibility of developing blood clots.  °· Your caregiver will tell you when you  can resume driving and light housework. Discuss this at your first office visit after discharge. °Diet °· No special diet is ordered after a TURP. However, if you are on a special diet for another medical problem, it should be continued.  °· Normal fluid intake is usually recommended.  °· Avoid alcohol and caffeinated drinks for 2 weeks. They irritate the bladder. Decaffeinated drinks are okay.  °· Avoid spicy foods.  °Bladder Function °· For the first 10 days, empty the bladder whenever you feel a definite desire. Do not try to hold the urine for long periods of time.  °· Urinating once or twice a night even after you are healed is not uncommon.  °· You may see some recurrence of blood in the urine after discharge from the hospital. This usually happens within 2 weeks after the procedure.If this occurs, force fluids again as you did in the hospital and reduce your activity.  °Bowel Function °· You may experience some constipation after surgery. This can be minimized by increasing fluids and fiber in your diet. Drink enough water and fluids to keep your urine clear or pale yellow.  °· A stool softener may be prescribed for use at home. Do not strain to move your bowels.  °· If you are requiring increased pain medicine, it is important that you take stool softeners to prevent constipation. This will help to promote proper healing by reducing the need to strain to move your bowels.  °Sexual Activity °· Semen movement   in the opposite direction and into the bladder (retrograde ejaculation) may occur. Since the semen passes into the bladder, cloudy urine can occur the first time you urinate after intercourse. Or, you may not have an ejaculation during erection. Ask your caregiver when you can resume sexual activity. Retrograde ejaculation and reduced semen discharge should not reduce one's pleasure of intercourse.  °Postoperative Visit °· Arrange the date and time of your after surgery visit with your caregiver.  °Return  to Work °· After your recovery is complete, you will be able to return to work and resume all activities. Your caregiver will inform you when you can return to work.  ° ° °Foley Catheter Care °A soft, flexible tube (Foley catheter) may have been placed in your bladder to drain urine and fluid. Follow these instructions: °Taking Care of the Catheter °· Keep the area where the catheter leaves your body clean.  °· Attach the catheter to the leg so there is no tension on the catheter.  °· Keep the drainage bag below the level of the bladder, but keep it OFF the floor.  °· Do not take long soaking baths. Your caregiver will give instructions about showering.  °· Wash your hands before touching ANYTHING related to the catheter or bag.  °· Using mild soap and warm water on a washcloth:  °· Clean the area closest to the catheter insertion site using a circular motion around the catheter.  °· Clean the catheter itself by wiping AWAY from the insertion site for several inches down the tube.  °· NEVER wipe upward as this could sweep bacteria up into the urethra (tube in your body that normally drains the bladder) and cause infection.  °· Place a small amount of sterile lubricant at the tip of the penis where the catheter is entering.  °Taking Care of the Drainage Bags °· Two drainage bags may be taken home: a large overnight drainage bag, and a smaller leg bag which fits underneath clothing.  °· It is okay to wear the overnight bag at any time, but NEVER wear the smaller leg bag at night.  °· Keep the drainage bag well below the level of your bladder. This prevents backflow of urine into the bladder and allows the urine to drain freely.  °· Anchor the tubing to your leg to prevent pulling or tension on the catheter. Use tape or a leg strap provided by the hospital.  °· Empty the drainage bag when it is 1/2 to 3/4 full. Wash your hands before and after touching the bag.  °· Periodically check the tubing for kinks to make sure  there is no pressure on the tubing which could restrict the flow of urine.  °Changing the Drainage Bags °· Cleanse both ends of the clean bag with alcohol before changing.  °· Pinch off the rubber catheter to avoid urine spillage during the disconnection.  °· Disconnect the dirty bag and connect the clean one.  °· Empty the dirty bag carefully to avoid a urine spill.  °· Attach the new bag to the leg with tape or a leg strap.  °Cleaning the Drainage Bags °· Whenever a drainage bag is disconnected, it must be cleaned quickly so it is ready for the next use.  °· Wash the bag in warm, soapy water.  °· Rinse the bag thoroughly with warm water.  °· Soak the bag for 30 minutes in a solution of white vinegar and water (1 cup vinegar to 1 quart warm   water).  °· Rinse with warm water.  °SEEK MEDICAL CARE IF:  °· You have chills or night sweats.  °· You are leaking around your catheter or have problems with your catheter. It is not uncommon to have sporadic leakage around your catheter as a result of bladder spasms. If the leakage stops, there is not much need for concern. If you are uncertain, call your caregiver.  °· You develop side effects that you think are coming from your medicines.  °SEEK IMMEDIATE MEDICAL CARE IF:  °· You are suddenly unable to urinate. Check to see if there are any kinks in the drainage tubing that may cause this. If you cannot find any kinks, call your caregiver immediately. This is an emergency.  °· You develop shortness of breath or chest pains.  °· Bleeding persists or clots develop in your urine.  °· You have a fever.  °· You develop pain in your back or over your lower belly (abdomen).  °· You develop pain or swelling in your legs.  °· Any problems you are having get worse rather than better.  °MAKE SURE YOU:  °· Understand these instructions.  °· Will watch your condition.  °· Will get help right away if you are not doing well or get worse.  ° ° °

## 2021-05-02 NOTE — H&P (Signed)
H&P   Chief Complaint: BPH with urinary retention  History of Present Illness: 75 year old male with BPH with urinary retention presents for transurethral resection of the prostate  Past Medical History:  Diagnosis Date   AAA (abdominal aortic aneurysm)    followed by vasc surgery   Alcohol abuse    Barretts esophagus    bx distal esophagus 2011 neg    Bladder outlet obstruction 04/02/2018   CAD in native artery    a. s/p stent to prox RCA and mid Cx.   Cataract    Cholelithiasis    Chronic osteomyelitis involving ankle and foot, right ([place]) 07/13/2018   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   2006 - 3 adenomas largest 12 mm  2008 - no polyps 2013-  2 mm cecal polyp, adenoma, routine repeat colonoscopy 02/2017 approx 05/14/2017 18 polyps max 12 mm ALL adenomas recall 2019 [name], MD, [facility]      Diverticulosis of colon    DJD (degenerative joint disease)    left hip   Full dentures    upper and lower   GERD (gastroesophageal reflux disease)    diet controlled    Gout    feet   Hiatal hernia    2 cm noted on upper endos 2011    Hx of adenomatous colonic polyps    Hyperlipidemia    Hypertension    Internal hemorrhoid    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 pt was on Plavix x 3 years then transitioned to ASA, no problems since   Non-small cell carcinoma of right lung, stage 1 ([place]) 01/30/2016   Renal failure, acute (H[facility])    hx of 2/2 medication, resolved   Tobacco abuse    smoker .5 ppd x 55 yrs   UTI (lower urinary tract infection)    Past Surgical History:  Procedure Laterality Date   BIOPSY  11/12/2020   Procedure: BIOPSY;  Surgeon: Doran Stabler, MD;  Location: [place];  Service: Gastroenterology;;   cataract surgery Bilateral 2018   removal of cataracts   COLONOSCOPY     hx polyps - [name] 02/2012   COLONOSCOPY WITH PROPOFOL N/A  11/12/2020   Procedure: COLONOSCOPY WITH PROPOFOL;  Surgeon: Doran Stabler, MD;  Location: [place];  Service: Gastroenterology;  Laterality: N/A;   CORONARY ANGIOPLASTY WITH STENT PLACEMENT     ESOPHAGOGASTRODUODENOSCOPY (EGD) WITH PROPOFOL N/A 11/12/2020   Procedure: ESOPHAGOGASTRODUODENOSCOPY (EGD) WITH PROPOFOL;  Surgeon: Doran Stabler, MD;  Location: [place];  Service: Gastroenterology;  Laterality: N/A;   ESOPHAGOGASTRODUODENOSCOPY (EGD) WITH PROPOFOL N/A 03/06/2021   Procedure: ESOPHAGOGASTRODUODENOSCOPY (EGD) WITH PROPOFOL;  Surgeon: Yetta Flock, MD;  [facility];  Service: Gastroenterology;  Laterality: N/A;   flexible cystourethroscopy  05/24/06   HEMOSTASIS CLIP PLACEMENT  03/06/2021   Procedure: HEMOSTASIS CLIP PLACEMENT;  Surgeon: Yetta Flock, MD;  [facility];  Service: Gastroenterology;;   HOT HEMOSTASIS N/A 03/06/2021   Procedure: HOT HEMOSTASIS (ARGON PLASMA COAGULATION/BICAP);  Surgeon: Yetta Flock, MD;  [facility];  Service: Gastroenterology;  Laterality: N/A;   INGUINAL HERNIA REPAIR  2005   LUNG LOBECTOMY Right 11/07   RUL   MULTIPLE TOOTH EXTRACTIONS     POLYPECTOMY  11/12/2020   Procedure: POLYPECTOMY;  Surgeon: Doran Stabler, MD;  Location: [place];  Service: Gastroenterology;;  UPPER GASTROINTESTINAL ENDOSCOPY      Home Medications:  Medications Prior to Admission  Medication Sig Dispense Refill Last Dose   albuterol (VENTOLIN HFA) 108 (90 Base) MCG/ACT inhaler INHALE 1-2 PUFFS BY MOUTH EVERY 6 HOURS AS NEEDED FOR WHEEZE OR SHORTNESS OF BREATH (Patient taking differently: Inhale 1-2 puffs into the lungs every 6 (six) hours as needed for wheezing or shortness of breath.) 6.7 each 5 05/01/2021   HYDROcodone-acetaminophen (NORCO) 7.5-325 MG tablet Take 1 tablet by mouth 2 (two) times daily as needed for moderate pain. 60 tablet 0 05/02/2021 at 0700   Multiple Vitamin (MULTIVITAMIN WITH  MINERALS) TABS tablet Take 1 tablet by mouth daily.   Past Week   pantoprazole (PROTONIX) 40 MG tablet Take 1 tablet (40 mg total) by mouth daily. 90 tablet 1 05/02/2021 at 0700   rosuvastatin (CRESTOR) 20 MG tablet Take 1 tablet (20 mg total) by mouth daily. 90 tablet 1 05/02/2021 at 0700   tamsulosin (FLOMAX) 0.4 MG CAPS capsule Take 2 capsules (0.8 mg total) by mouth every evening. 180 capsule 3 05/01/2021   ZENPEP 20000-63000 units CPEP TAKE 3 CAPSULES BY MOUTH 3 (THREE) TIMES DAILY BEFORE MEALS. (Patient taking differently: Take 3 capsules by mouth 3 (three) times daily before meals.) 270 capsule 0 05/01/2021   Allergies:  Allergies  Allergen Reactions   Candesartan Cilexetil-Hctz Other (See Comments)    Acute renal failure   Hydrochlorothiazide Other (See Comments)    Acute renal failure   Shellfish Allergy Other (See Comments)    Gout occurs    Family History  Problem Relation Age of Onset   Diabetes Mother    Hypertension Sister    Heart disease Sister    Diabetes Sister    Arthritis Brother    Gout Brother    Colon cancer Neg Hx    Esophageal cancer Neg Hx    Rectal cancer Neg Hx    Stomach cancer Neg Hx    Colon polyps Neg Hx    Social History:  reports that he has been smoking cigarettes. He has a 27.50 pack-year smoking history. He has never used smokeless tobacco. He reports that he does not currently use alcohol. He reports that he does not use drugs.  ROS: A complete review of systems was performed.  All systems are negative except for pertinent findings as noted. ROS   Physical Exam:  Vital signs in last 24 hours: Temp:  [98 F (36.7 C)] 98 F (36.7 C) (11/16 0816) Pulse Rate:  [78] 78 (11/16 0816) Resp:  [16] 16 (11/16 0816) BP: (152)/(85) 152/85 (11/16 0816) SpO2:  [100 %] 100 % (11/16 0816) Weight:  [62.7 kg] 62.7 kg (11/16 0830) General:  Alert and oriented, No acute distress HEENT: Normocephalic, atraumatic Neck: No JVD or  lymphadenopathy Cardiovascular: Regular rate and rhythm Lungs: Regular rate and effort Abdomen: Soft, nontender, nondistended, no abdominal masses Back: No CVA tenderness Extremities: No edema Neurologic: Grossly intact  Laboratory Data:  Results for orders placed or performed during the hospital encounter of 05/02/21 (from the past 24 hour(s))  SARS Coronavirus 2 by RT PCR (hospital order, performed in [facility] hospital lab) Nasopharyngeal Nasopharyngeal Swab     Status: None   Collection Time: 05/02/21  8:01 AM   Specimen: Nasopharyngeal Swab  Result Value Ref Range   SARS Coronavirus 2 NEGATIVE NEGATIVE  Glucose, capillary     Status: None   Collection Time: 05/02/21  8:13 AM  Result Value Ref Range  Glucose-Capillary 91 70 - 99 mg/dL   Recent Results (from the past 240 hour(s))  SARS Coronavirus 2 by RT PCR (hospital order, performed in [facility] hospital lab) Nasopharyngeal Nasopharyngeal Swab     Status: None   Collection Time: 05/02/21  8:01 AM   Specimen: Nasopharyngeal Swab  Result Value Ref Range Status   SARS Coronavirus 2 NEGATIVE NEGATIVE Final    Comment: (NOTE) SARS-CoV-2 target nucleic acids are NOT DETECTED.  The SARS-CoV-2 RNA is generally detectable in upper and lower respiratory specimens during the acute phase of infection. The lowest concentration of SARS-CoV-2 viral copies this assay can detect is 250 copies / mL. A negative result does not preclude SARS-CoV-2 infection and should not be used as the sole basis for treatment or other patient management decisions.  A negative result may occur with improper specimen collection / handling, submission of specimen other than nasopharyngeal swab, presence of viral mutation(s) within the areas targeted by this assay, and inadequate number of viral copies (<250 copies / mL). A negative result must be combined with clinical observations, patient history, and epidemiological information.  Fact Sheet for  Patients:   StrictlyIdeas.no  Fact Sheet for Healthcare Providers: BankingDealers.co.za  This test is not yet approved or  cleared by the [place] FDA and has been authorized for detection and/or diagnosis of SARS-CoV-2 by FDA under an Emergency Use Authorization (EUA).  This EUA will remain in effect (meaning this test can be used) for the duration of the COVID-19 declaration under Section 564(b)(1) of the Act, 21 U.S.C. section 360bbb-3(b)(1), unless the authorization is terminated or revoked sooner.  Performed at [facility], [place] [address] [place], [place] [address]    Creatinine: Recent Labs    04/27/21 0848  CREATININE 1.12    Impression/Assessment:  BPH with urinary retention  Plan:  Proceed with TURP.  Risk and benefits again discussed as outlined in previous note  [name], [name] 05/02/2021, 11:03 AM

## 2021-05-02 NOTE — OR Nursing (Signed)
Foley catheter and leg bag removed at 1115 prior to surgical procedure.

## 2021-05-03 ENCOUNTER — Encounter (HOSPITAL_COMMUNITY): Payer: Self-pay | Admitting: Urology

## 2021-05-03 DIAGNOSIS — Z79899 Other long term (current) drug therapy: Secondary | ICD-10-CM | POA: Diagnosis not present

## 2021-05-03 DIAGNOSIS — Z20822 Contact with and (suspected) exposure to covid-19: Secondary | ICD-10-CM | POA: Diagnosis not present

## 2021-05-03 DIAGNOSIS — I10 Essential (primary) hypertension: Secondary | ICD-10-CM | POA: Diagnosis not present

## 2021-05-03 DIAGNOSIS — Z833 Family history of diabetes mellitus: Secondary | ICD-10-CM | POA: Diagnosis not present

## 2021-05-03 DIAGNOSIS — Z8679 Personal history of other diseases of the circulatory system: Secondary | ICD-10-CM | POA: Diagnosis not present

## 2021-05-03 DIAGNOSIS — Z85118 Personal history of other malignant neoplasm of bronchus and lung: Secondary | ICD-10-CM | POA: Diagnosis not present

## 2021-05-03 DIAGNOSIS — F17210 Nicotine dependence, cigarettes, uncomplicated: Secondary | ICD-10-CM | POA: Diagnosis not present

## 2021-05-03 DIAGNOSIS — C61 Malignant neoplasm of prostate: Secondary | ICD-10-CM | POA: Diagnosis not present

## 2021-05-03 LAB — SURGICAL PATHOLOGY

## 2021-05-03 MED ORDER — BELLADONNA ALKALOIDS-OPIUM 16.2-30 MG RE SUPP
1.0000 | Freq: Four times a day (QID) | RECTAL | Status: DC | PRN
  Administered 2021-05-03: 08:00:00 1 via RECTAL
  Filled 2021-05-03: qty 1

## 2021-05-03 MED ORDER — CHLORHEXIDINE GLUCONATE CLOTH 2 % EX PADS
6.0000 | MEDICATED_PAD | Freq: Every day | CUTANEOUS | Status: DC
  Administered 2021-05-03: 13:00:00 6 via TOPICAL

## 2021-05-03 NOTE — TOC Initial Note (Signed)
Transition of Care [facility]) - Initial/Assessment Note    Patient Details  Name: [name] MRN: [id number] Date of Birth: October 10, 1945  [facility]) CM/SW Contact:    Leeroy Cha, RN Phone Number: 05/03/2021, 7:41 AM  Clinical Narrative:                 Preoperative diagnosis: Bladder outlet obstruction secondary to BPH   Postoperative diagnosis:  Bladder outlet obstruction secondary to BPH   Procedure:   Cystoscopy Transurethral resection of the prostate TOC PLAN OF CARE: Following for toc dc needs Following for progression post op Expected Discharge Plan: Home/Self Care Barriers to Discharge: Continued Medical Work up   Patient Goals and CMS Choice Patient states their goals for this hospitalization and ongoing recovery are:: to go home CMS Medicare.gov Compare Post Acute Care list provided to:: Patient    Expected Discharge Plan and Services Expected Discharge Plan: Home/Self Care   Discharge Planning Services: CM Consult   Living arrangements for the past 2 months: Single Family Home                                      Prior Living Arrangements/Services Living arrangements for the past 2 months: Single Family Home Lives with:: Self Patient language and need for interpreter reviewed:: Yes Do you feel safe going back to the place where you live?: Yes            Criminal Activity/Legal Involvement Pertinent to Current Situation/Hospitalization: No - Comment as needed  Activities of Daily Living Home Assistive Devices/Equipment: Cane (specify quad or straight) ADL Screening (condition at time of admission) Patient's cognitive ability adequate to safely complete daily activities?: Yes Is the patient deaf or have difficulty hearing?: No Does the patient have difficulty seeing, even when wearing glasses/contacts?: No Does the patient have difficulty concentrating, remembering, or making decisions?: No Patient able to express need  for assistance with ADLs?: Yes Does the patient have difficulty dressing or bathing?: No Independently performs ADLs?: Yes (appropriate for developmental age) Does the patient have difficulty walking or climbing stairs?: No Weakness of Legs: None Weakness of Arms/Hands: Both  Permission Sought/Granted                  Emotional Assessment Appearance:: Appears stated age     Orientation: : Oriented to Self, Oriented to Place, Oriented to  Time, Oriented to Situation Alcohol / Substance Use: Not Applicable Psych Involvement: No (comment)  Admission diagnosis:  BPH (benign prostatic hyperplasia) [N40.0] Patient Active Problem List   Diagnosis Date Noted   BPH (benign prostatic hyperplasia) 05/02/2021   Acute GI bleeding 03/06/2021   Upper GI bleed    Anticoagulated    Symptomatic anemia    Hypovolemic shock ([place])    Dieulafoy lesion of duodenum    Atrial fibrillation ([place]) 03/05/2021   Pancreatic mass 11/08/2020   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   Protein-calorie malnutrition, severe 11/07/2020   Urinary retention 04/02/2018   Mild alcohol use disorder 12/09/2016   Premature atrial contractions 07/31/2015   Chronic use of opiate for therapeutic purpose 05/01/2015   UTI (urinary tract infection) 03/02/2015   Atherosclerosis of aorta ([place]) 01/13/2015   Prediabetes 12/30/2014   AAA (abdominal aortic aneurysm)    Healthcare maintenance 06/30/2012   CAD (coronary artery disease) 03/17/2012   Erectile dysfunction 10/12/2010   Arthropathy of pelvic  region and thigh 05/02/2008   CKD (chronic kidney disease) stage 2, GFR 60-89 ml/min 08/07/2007   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   Gout 04/18/2006   Tobacco use disorder 04/18/2006   Essential hypertension 04/18/2006   GERD 04/18/2006   PCP:  Axel Filler, MD Pharmacy:   [id number] - [place], [place] [address] [place] [address] Phone: [phone] Fax: [phone]  [facility] - [place], [place] [address] Phone: [phone] Fax: [phone]     Social Determinants of Health (SDOH) Interventions    Readmission Risk Interventions No flowsheet data found.

## 2021-05-03 NOTE — Progress Notes (Signed)
Urology Inpatient Progress Report  BPH (benign prostatic hyperplasia) [N40.0]  Procedure(s): TRANSURETHRAL RESECTION OF THE PROSTATE (TURP)  1 Day Post-Op   Intv/Subj: No acute events overnight. Patient is without complaint.  Has some bladder spasm symptoms this morning that improved with belladonna suppository.  Had to be irrigated a few times overnight.  Still with some mild hematuria on CBI.  Active Problems:   BPH (benign prostatic hyperplasia)  Current Facility-Administered Medications  Medication Dose Route Frequency Provider Last Rate Last Admin   acetaminophen (TYLENOL) tablet 650 mg  650 mg Oral Q4H PRN Marton Redwood III, MD       albuterol (PROVENTIL) (2.5 MG/3ML) 0.083% nebulizer solution 2.5 mg  2.5 mg Nebulization Q6H PRN Marton Redwood III, MD       belladonna-opium (B&O) suppository 16.2-30 mg  1 suppository Rectal Q6H PRN Marton Redwood III, MD   1 suppository at 05/03/21 0630   Chlorhexidine Gluconate Cloth 2 % PADS 6 each  6 each Topical Daily Marton Redwood III, MD   6 each at 05/03/21 1244   diphenhydrAMINE (BENADRYL) injection 12.5-25 mg  12.5-25 mg Intravenous Q6H PRN Marton Redwood III, MD       Or   diphenhydrAMINE (BENADRYL) 12.5 MG/5ML elixir 12.5-25 mg  12.5-25 mg Oral Q6H PRN Marton Redwood III, MD       feeding supplement (ENSURE ENLIVE / ENSURE PLUS) liquid 237 mL  237 mL Oral BID BM Marton Redwood III, MD   237 mL at 05/03/21 1546   HYDROmorphone (DILAUDID) injection 0.5-1 mg  0.5-1 mg Intravenous Q2H PRN Marton Redwood III, MD   1 mg at 05/03/21 0815   lipase/protease/amylase (CREON) capsule 60,000 Units  60,000 Units Oral TID WC Marton Redwood III, MD   60,000 Units at 05/03/21 1246   neomycin-bacitracin-polymyxin (NEOSPORIN) ointment packet 1 application  1 application Topical TID PRN Marton Redwood III, MD       ondansetron [facility]) injection 4 mg  4 mg Intravenous Q4H PRN Marton Redwood III, MD   4 mg at 05/03/21 1601   oxyCODONE (Oxy IR/ROXICODONE)  immediate release tablet 5 mg  5 mg Oral Q4H PRN Marton Redwood III, MD   5 mg at 05/03/21 0755   pantoprazole (PROTONIX) EC tablet 40 mg  40 mg Oral Daily Marton Redwood III, MD   40 mg at 05/03/21 0755   rosuvastatin (CRESTOR) tablet 20 mg  20 mg Oral Daily Marton Redwood III, MD   20 mg at 05/03/21 0755   senna-docusate (Senokot-S) tablet 2 tablet  2 tablet Oral QHS Marton Redwood III, MD   2 tablet at 05/02/21 2121   sodium chloride irrigation 0.9 % 3,000 mL  3,000 mL Irrigation Continuous Marton Redwood III, MD   3,000 mL at 05/03/21 0900   zolpidem (AMBIEN) tablet 5 mg  5 mg Oral QHS PRN Marton Redwood III, MD         Objective: Vital: Vitals:   05/03/21 0027 05/03/21 0457 05/03/21 1029 05/03/21 1448  BP: (!) 165/77 (!) 171/108 (!) 152/88 132/71  Pulse: 70 69 81 73  Resp: 17 17 16 16   Temp: 97.9 F (36.6 C) 98.1 F (36.7 C) 97.7 F (36.5 C) 98 F (36.7 C)  TempSrc: Oral Oral    SpO2: 97% 100% 95% 98%  Weight:      Height:       I/Os: I/O last 3 completed  shifts: In: 4841.5 [I.V.:1641.5; Other:3000; IV Piggyback:200] Out: 18500 [Urine:18500]  Physical Exam:  General: Patient is in no apparent distress Lungs: Normal respiratory effort, chest expands symmetrically. GI:The abdomen is soft and nontender without mass. Foley: Three-way Foley catheter in place.  I irrigated his Foley with return of a couple of small clots and then restarted the CBI. Ext: lower extremities symmetric  Lab Results: No results for input(s): WBC, HGB, HCT in the last 72 hours. No results for input(s): NA, K, CL, CO2, GLUCOSE, BUN, CREATININE, CALCIUM in the last 72 hours. No results for input(s): LABPT, INR in the last 72 hours. No results for input(s): LABURIN in the last 72 hours. Results for orders placed or performed during the hospital encounter of 05/02/21  SARS Coronavirus 2 by RT PCR (hospital order, performed in [facility] hospital lab) Nasopharyngeal Nasopharyngeal Swab     Status:  None   Collection Time: 05/02/21  8:01 AM   Specimen: Nasopharyngeal Swab  Result Value Ref Range Status   SARS Coronavirus 2 NEGATIVE NEGATIVE Final    Comment: (NOTE) SARS-CoV-2 target nucleic acids are NOT DETECTED.  The SARS-CoV-2 RNA is generally detectable in upper and lower respiratory specimens during the acute phase of infection. The lowest concentration of SARS-CoV-2 viral copies this assay can detect is 250 copies / mL. A negative result does not preclude SARS-CoV-2 infection and should not be used as the sole basis for treatment or other patient management decisions.  A negative result may occur with improper specimen collection / handling, submission of specimen other than nasopharyngeal swab, presence of viral mutation(s) within the areas targeted by this assay, and inadequate number of viral copies (<250 copies / mL). A negative result must be combined with clinical observations, patient history, and epidemiological information.  Fact Sheet for Patients:   StrictlyIdeas.no  Fact Sheet for Healthcare Providers: BankingDealers.co.za  This test is not yet approved or  cleared by the [place] FDA and has been authorized for detection and/or diagnosis of SARS-CoV-2 by FDA under an Emergency Use Authorization (EUA).  This EUA will remain in effect (meaning this test can be used) for the duration of the COVID-19 declaration under Section 564(b)(1) of the Act, 21 U.S.C. section 360bbb-3(b)(1), unless the authorization is terminated or revoked sooner.  Performed at [facility], [place] [address], [place], [place] [address]     Studies/Results: No results found.  Assessment: BPH with urinary retention Gross hematuria  Procedure(s): TRANSURETHRAL RESECTION OF THE PROSTATE (TURP), 1 Day Post-Op  doing well.  Plan: Continue to wean CBI as able.  I want to keep him another night on CBI.   Link Snuffer, MD Urology 05/03/2021, 5:38 PM

## 2021-05-04 DIAGNOSIS — Z8679 Personal history of other diseases of the circulatory system: Secondary | ICD-10-CM | POA: Diagnosis not present

## 2021-05-04 DIAGNOSIS — Z833 Family history of diabetes mellitus: Secondary | ICD-10-CM | POA: Diagnosis not present

## 2021-05-04 DIAGNOSIS — Z20822 Contact with and (suspected) exposure to covid-19: Secondary | ICD-10-CM | POA: Diagnosis not present

## 2021-05-04 DIAGNOSIS — Z85118 Personal history of other malignant neoplasm of bronchus and lung: Secondary | ICD-10-CM | POA: Diagnosis not present

## 2021-05-04 DIAGNOSIS — Z79899 Other long term (current) drug therapy: Secondary | ICD-10-CM | POA: Diagnosis not present

## 2021-05-04 DIAGNOSIS — C61 Malignant neoplasm of prostate: Secondary | ICD-10-CM | POA: Diagnosis not present

## 2021-05-04 DIAGNOSIS — I10 Essential (primary) hypertension: Secondary | ICD-10-CM | POA: Diagnosis not present

## 2021-05-04 DIAGNOSIS — F17210 Nicotine dependence, cigarettes, uncomplicated: Secondary | ICD-10-CM | POA: Diagnosis not present

## 2021-05-04 LAB — HEMOGLOBIN AND HEMATOCRIT, BLOOD
HCT: 25.1 % — ABNORMAL LOW (ref 39.0–52.0)
Hemoglobin: 8.1 g/dL — ABNORMAL LOW (ref 13.0–17.0)

## 2021-05-04 NOTE — Plan of Care (Signed)
  Problem: Activity: Goal: Risk for activity intolerance will decrease Outcome: Progressing   Problem: Nutrition: Goal: Adequate nutrition will be maintained Outcome: Progressing   Problem: Coping: Goal: Level of anxiety will decrease Outcome: Progressing   

## 2021-05-04 NOTE — TOC Transition Note (Signed)
Transition of Care [facility]) - CM/SW Discharge Note   Patient Details  Name: [name] MRN: [id number] Date of Birth: 07-19-1945  [facility]) CM/SW Contact:  Leeroy Cha, RN Phone Number: 05/04/2021, 9:46 AM   Clinical Narrative:    Patient discharged to return home with self care.  No toc needs present.   Final next level of care: Home/Self Care Barriers to Discharge: Barriers Resolved   Patient Goals and CMS Choice Patient states their goals for this hospitalization and ongoing recovery are:: to go home CMS Medicare.gov Compare Post Acute Care list provided to:: Patient    Discharge Placement                       Discharge Plan and Services   Discharge Planning Services: CM Consult                                 Social Determinants of Health (SDOH) Interventions     Readmission Risk Interventions No flowsheet data found.

## 2021-05-07 ENCOUNTER — Telehealth: Payer: Self-pay

## 2021-05-07 ENCOUNTER — Ambulatory Visit (INDEPENDENT_AMBULATORY_CARE_PROVIDER_SITE_OTHER): Payer: Medicare HMO | Admitting: Student in an Organized Health Care Education/Training Program

## 2021-05-07 ENCOUNTER — Encounter: Payer: Self-pay | Admitting: Student in an Organized Health Care Education/Training Program

## 2021-05-07 VITALS — BP 140/79 | HR 64 | Temp 97.5°F | Ht 73.0 in | Wt 131.8 lb

## 2021-05-07 DIAGNOSIS — I10 Essential (primary) hypertension: Secondary | ICD-10-CM

## 2021-05-07 DIAGNOSIS — M25551 Pain in right hip: Secondary | ICD-10-CM

## 2021-05-07 DIAGNOSIS — E43 Unspecified severe protein-calorie malnutrition: Secondary | ICD-10-CM | POA: Diagnosis not present

## 2021-05-07 DIAGNOSIS — R339 Retention of urine, unspecified: Secondary | ICD-10-CM | POA: Diagnosis not present

## 2021-05-07 DIAGNOSIS — M25552 Pain in left hip: Secondary | ICD-10-CM | POA: Diagnosis not present

## 2021-05-07 DIAGNOSIS — K5900 Constipation, unspecified: Secondary | ICD-10-CM | POA: Insufficient documentation

## 2021-05-07 DIAGNOSIS — I714 Abdominal aortic aneurysm, without rupture, unspecified: Secondary | ICD-10-CM

## 2021-05-07 DIAGNOSIS — Z79891 Long term (current) use of opiate analgesic: Secondary | ICD-10-CM | POA: Diagnosis not present

## 2021-05-07 MED ORDER — SENNA 8.6 MG PO TABS: 2.0000 | ORAL_TABLET | Freq: Two times a day (BID) | ORAL | 1 refills | Status: DC

## 2021-05-07 MED ORDER — HYDROCODONE-ACETAMINOPHEN 7.5-325 MG PO TABS: 1.0000 | ORAL_TABLET | Freq: Two times a day (BID) | ORAL | 0 refills | Status: DC | PRN

## 2021-05-07 NOTE — Telephone Encounter (Signed)
Received a TC from patient who states he just left [facility] for his appt with PCP.  He states he is wants to clarify his RX for ZenPep.  He has been taking Zenpep , 1 capsule, three times daily and has been taking it this way for 3 months or so.  His RX today was written for Zenpep, 3 capsules, three times daily.    Will send to PCP to clarify. Thank you, SChaplin, RN,BSN

## 2021-05-07 NOTE — Assessment & Plan Note (Addendum)
Most recent note from vascular surgery states AAA has expanded to 5.6cm and warrants repair, with planned endovascular approach. We discussed the importance of improving strength and nutrition to maximize the outcome of this intervention, and he has planned follow-up with vascular surgery scheduled for next month.  We will continue with rosuvastatin 20 mg daily, aspirin is on hold for now given recent GI bleeding event, blood pressure is well controlled off medications.[place] Kitchen

## 2021-05-07 NOTE — Assessment & Plan Note (Addendum)
Underwent TURP procedure with urology on 11/16 with follow-up planned for 11/23. He reports some very minor bleeding from penis at catheter insertion site and will follow-up with urology in 2 days.  We will continue with Foley catheter and Flomax 0.8 mg daily.

## 2021-05-07 NOTE — Assessment & Plan Note (Signed)
Reports constipation over the last week, following TURP procedure. This is likely multifactorial, following recent hospitalization and increased pain medication use. Will start Senna BID to help with constipation.  - Senna BID

## 2021-05-07 NOTE — Assessment & Plan Note (Deleted)
Blood pressure well-controlled today at 140/79.

## 2021-05-07 NOTE — Patient Instructions (Signed)
Thank you, [name] for allowing [place] to provide your care today. Today we discussed:  Pain management following your procedure: you can take the Norco 3 times a day for pain as needed. Constipation: we have prescribed a medicine to help with your constipation. Take it twice per day. Continue to eat, drink, and exercise as best as you can.   I have ordered the following medication/changed the following medications:   Start the following medications: Meds ordered this encounter  Medications   HYDROcodone-acetaminophen (NORCO) 7.5-325 MG tablet    Sig: Take 1 tablet by mouth 2 (two) times daily as needed for moderate pain.    Dispense:  60 tablet    Refill:  0    Post-op pain, ok to fill early on 05/07/21   senna (SENOKOT) 8.6 MG TABS tablet    Sig: Take 2 tablets (17.2 mg total) by mouth in the morning and at bedtime.    Dispense:  120 tablet    Refill:  1     Follow up: 3 months   Remember: Please call our office with any concerning symptoms immediately  Should you have any questions or concerns please call the internal medicine clinic at [phone].

## 2021-05-07 NOTE — Assessment & Plan Note (Addendum)
Previous diagnosis of hypertension, with blood pressures well-controlled on regimen of amlodipine 10mg  and metoprolol through 2020. Following weight loss of ~50 lbs in last 2 years as well as multiple hospitalizations, systolic pressures decreased and amlodipine was discontinued on discharge from hospital in 10/2020, while metoprolol was discontinued by cardiology due to sinus bradycardia in 02/2021. Blood pressure 140/79 does not warrant any change in management at this time, and may be slightly elevated from baseline in the presence of post-procedure pain.  No need for antihypertensives at this time, will continue to monitor.

## 2021-05-07 NOTE — Progress Notes (Signed)
  Subjective:     Patient ID: [name], male   DOB: 10-19-1945, 75 y.o.   MRN: [id number]  [name] is a 75 y/o who presents to clinic following a recent hospitalization (9/20) and TURP procedure (11/16). He reports he is doing well since being discharged on Friday, except that he has been experiencing increased pain that has not been controlled on his current pain regimen. He rates his pain a 9/10 without pain medication, and 7/10 after taking pain medication.   He has also been constipated, and has only been able to use the bathroom 1x since his TURP procedure, despite feeling the need to go multiple times. He also reports some minor bleeding from his penis at the catheter insertion site, which he noted when attempting to use the bathroom. He notes that his appetite has not been great, but his sister and brother have been encouraging him to eat, and he eats 2-3x per day.    Review of Systems  Cardiovascular:  Negative for palpitations.  Gastrointestinal:  Positive for constipation.  Genitourinary:  Positive for hematuria and penile pain.      Objective:   Physical Exam Constitutional:      Appearance: Normal appearance.  HENT:     Head: Normocephalic and atraumatic.  Cardiovascular:     Rate and Rhythm: Normal rate. Rhythm irregular.  Pulmonary:     Effort: Pulmonary effort is normal.     Breath sounds: Normal breath sounds.  Abdominal:     Tenderness: There is no abdominal tenderness.     Comments: Significant pulsatile abdominal mass noted on exam.  Musculoskeletal:     Right lower leg: No edema.     Left lower leg: No edema.  Skin:    General: Skin is warm and dry.  Neurological:     Mental Status: He is alert.       Assessment & Plan:   See problem-based charting.

## 2021-05-07 NOTE — Assessment & Plan Note (Signed)
Chronic use of low-dose opioids for MSK pain without issue. Following recent TURP procedure, reports increased pain that is not well-managed on current regimen of twice daily prn, and he has been taking one table three times daily. With stable chronic use, he has probably developed some degree of tolerance that has led to decreased pain control post-TURP. Discussed that taking 3 doses daily for present pain as needed is okay.

## 2021-05-07 NOTE — Progress Notes (Signed)
Attestation for Student Documentation:  I personally was present and performed or re-performed the history, physical exam and medical decision-making activities of this service and have verified that the service and findings are accurately  documented in the student's note.  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  At his last visit we noted atrial fibrillation on long-term monitor, he was at increased risk of stroke, we trialed a low dose of rivaroxaban but unfortunately he had a bleeding event from a previously unknown Dieulafoy lesion.  Since discharge from the hospital he notes no further GI bleeding.  Eating and drinking well, getting support from his family, still very weak with some low functional status and reduced weight.  Since the TURP, he has had a Foley catheter in place, empties it about 4 times daily, urine in the bag is blood-tinged but no clots.  He was discharged with a hemoglobin around 8.5 g, no orthostatic symptoms, no worsening dyspnea on exertion.  He has a follow-up with urology later this week and hopefully the Foley catheter can be removed.  He also has follow-up with cardiology planned for preoperative cardiac risk assessment and optimization prior to endovascular repair of abdominal aortic aneurysm.  I refilled a 24-month supply of chronic oxycodone to help with his osteoarthritis related pain, should also help with the pain from the recent urologic procedure.  We talked for a time about nutrition, he is trying to improve his protein intake and regain some weight, on pancreatic enzyme replacement.  I think after the vascular surgery  is planned in early December we can better discuss timing of GI follow-up which will involve endoscopic ultrasound to evaluate the potential pancreatic head mass.   Axel Filler, MD 05/07/2021, 1:50 PM

## 2021-05-07 NOTE — Telephone Encounter (Signed)
Great question. Yes, it will be more effective if he can use 3 capsules with each meal to help with absorption.

## 2021-05-07 NOTE — Telephone Encounter (Signed)
TC to patient, he was informed of Dr. Autumn Patty reply:  "Great question. Yes, it will be more effective if he can use 3 capsules with each meal to help with absorption."  Patient verbalizes understanding and is very appreciative. SChaplin, RN,BSN

## 2021-05-07 NOTE — Assessment & Plan Note (Signed)
Weight is stable today and tolerating Zenpep without issue. Patient states he has diminished appetite but family members encourage him to eat and he is able to eat 2-3x daily. Encouraged to continue eating well.

## 2021-05-09 DIAGNOSIS — R338 Other retention of urine: Secondary | ICD-10-CM | POA: Diagnosis not present

## 2021-05-09 DIAGNOSIS — C61 Malignant neoplasm of prostate: Secondary | ICD-10-CM | POA: Diagnosis not present

## 2021-05-19 NOTE — Discharge Summary (Signed)
Physician Discharge Summary  Patient ID: KAMAURY CUTBIRTH MRN: [id number] DOB/AGE: 75/03/1946 75 y.o.  Admit date: 05/02/2021 Discharge date: 05/04/2021  Admission Diagnoses:  Discharge Diagnoses:  Active Problems:   BPH (benign prostatic hyperplasia)   Discharged Condition: good  Hospital Course: 75 year old male underwent TURP.  He tolerated procedure well and stable postoperative.  The following day, we are unable to wean his CBI and therefore he remained another night at which point his continuous bladder irrigation was able to be weaned off.  He was then discharged with a catheter in stable condition.  Consults: None  Significant Diagnostic Studies: None  Treatments: surgery: As above  Discharge Exam: Blood pressure 129/64, pulse 90, temperature (!) 101.2 F (38.4 C), temperature source Oral, resp. rate 18, height 6' (1.829 m), weight 62.7 kg, SpO2 96 %. General appearance: alert no acute distress Adequate perfusion of extremities Nonlabored respiration Three-way Foley catheter in place draining pink urine off CBI  Disposition: Discharge disposition: 01-Home or Self Care        Allergies as of 05/04/2021       Reactions   Candesartan Cilexetil-hctz Other (See Comments)   Acute renal failure   Hydrochlorothiazide Other (See Comments)   Acute renal failure   Shellfish Allergy Other (See Comments)   Gout occurs        Medication List     TAKE these medications    albuterol 108 (90 Base) MCG/ACT inhaler Commonly known as: VENTOLIN HFA INHALE 1-2 PUFFS BY MOUTH EVERY 6 HOURS AS NEEDED FOR WHEEZE OR SHORTNESS OF BREATH What changed: See the new instructions.   multivitamin with minerals Tabs tablet Take 1 tablet by mouth daily.   pantoprazole 40 MG tablet Commonly known as: Protonix Take 1 tablet (40 mg total) by mouth daily.   rosuvastatin 20 MG tablet Commonly known as: CRESTOR Take 1 tablet (20 mg total) by mouth daily.   tamsulosin 0.4 MG  Caps capsule Commonly known as: FLOMAX Take 2 capsules (0.8 mg total) by mouth every evening.   Zenpep 20000-63000 units Cpep Generic drug: Pancrelipase (Lip-Prot-Amyl) TAKE 3 CAPSULES BY MOUTH 3 (THREE) TIMES DAILY BEFORE MEALS.         Signed: Marton Redwood, III 05/19/2021, 9:13 AM

## 2021-05-22 ENCOUNTER — Ambulatory Visit (INDEPENDENT_AMBULATORY_CARE_PROVIDER_SITE_OTHER): Payer: Medicare HMO | Admitting: Cardiology

## 2021-05-22 ENCOUNTER — Other Ambulatory Visit: Payer: Self-pay

## 2021-05-22 ENCOUNTER — Encounter: Payer: Self-pay | Admitting: Cardiology

## 2021-05-22 VITALS — BP 160/82 | HR 93 | Ht 75.0 in | Wt 138.6 lb

## 2021-05-22 DIAGNOSIS — I491 Atrial premature depolarization: Secondary | ICD-10-CM

## 2021-05-22 NOTE — Progress Notes (Signed)
Electrophysiology Office Note   Date:  05/22/2021   ID:  [name], DOB [birth year], MRN [id number]  PCP:  Axel Filler, MD  Cardiologist:  Nolberto Hanlon Primary Electrophysiologist:  Constance Haw, MD    Chief Complaint: AF   History of Present Illness: [name] is a 75 y.o. male who is being seen today for the evaluation of AF at the request of Loel Dubonnet, NP. Presenting today for electrophysiology evaluation.  He has a history significant coronary artery disease status post RCA and circumflex stent in 2004, AAA with ectatic thoracic and iliac arteries followed by vascular surgery, CKD stage II, hypertension, prediabetes, colonic polyposis, chronic opioid abuse, gout, alcohol abuse, Barrett's esophagus, lung cancer status post lobectomy.  He was admitted to the hospital 11/06/2020 with progressive weakness, poor appetite, weight loss, and constipation.  His hospital course was notable for sepsis due to Enterobacter complicated by cystitis.  Hospitalization also was notable for nonobstructive portal vein thrombosis, multiple aortic aneurysms and a penetrating aortic ulcer.  He had severe hepatic steatosis concerning for underlying cirrhosis, cholelithiasis, acute hypoproliferative macrocytic anemia.  He was seen in cardiology clinic and wore a 3-day monitor which was suggestive for atrial fibrillation.  Anticoagulation was deferred given his recent admission and overall poor health.  Today, he denies symptoms of palpitations, chest pain, shortness of breath, orthopnea, PND, lower extremity edema, claudication, dizziness, presyncope, syncope, bleeding, or neurologic sequela. The patient is tolerating medications without difficulties.  Today he is feeling well.  He has no chest pain or shortness of breath.  Is able to do all of his daily activities.  He has plans for surgery for his abdominal aortic aneurysm.   Past Medical History:  Diagnosis Date    AAA (abdominal aortic aneurysm)    followed by vasc surgery   Alcohol abuse    Barretts esophagus    bx distal esophagus 2011 neg    Bladder outlet obstruction 04/02/2018   CAD in native artery    a. s/p stent to prox RCA and mid Cx.   Cataract    Cholelithiasis    Chronic osteomyelitis involving ankle and foot, right ([place]) 07/13/2018   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   2006 - 3 adenomas largest 12 mm  2008 - no polyps 2013-  2 mm cecal polyp, adenoma, routine repeat colonoscopy 02/2017 approx 05/14/2017 18 polyps max 12 mm ALL adenomas recall 2019 [name], MD, [facility]      Dieulafoy lesion of duodenum    Diverticulosis of colon    DJD (degenerative joint disease)    left hip   Full dentures    upper and lower   GERD (gastroesophageal reflux disease)    diet controlled    Gout    feet   Hiatal hernia    2 cm noted on upper endos 2011    Hx of adenomatous colonic polyps    Hyperlipidemia    Hypertension    Internal hemorrhoid    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 pt was on Plavix x 3 years then transitioned to ASA, no problems since   Non-small cell carcinoma of right lung, stage 1 ([employer]) 01/30/2016   Renal failure, acute ([facility])    hx of 2/2 medication, resolved   Tobacco abuse    smoker .5 ppd x 55 yrs  UTI (lower urinary tract infection)    Past Surgical History:  Procedure Laterality Date   BIOPSY  11/12/2020   Procedure: BIOPSY;  Surgeon: Doran Stabler, MD;  Location: [place];  Service: Gastroenterology;;   cataract surgery Bilateral 2018   removal of cataracts   COLONOSCOPY     hx polyps - [name] 02/2012   COLONOSCOPY WITH PROPOFOL N/A 11/12/2020   Procedure: COLONOSCOPY WITH PROPOFOL;  Surgeon: Doran Stabler, MD;  Location: [place];  Service: Gastroenterology;  Laterality: N/A;   CORONARY ANGIOPLASTY WITH STENT PLACEMENT      ESOPHAGOGASTRODUODENOSCOPY (EGD) WITH PROPOFOL N/A 11/12/2020   Procedure: ESOPHAGOGASTRODUODENOSCOPY (EGD) WITH PROPOFOL;  Surgeon: Doran Stabler, MD;  Location: [place];  Service: Gastroenterology;  Laterality: N/A;   ESOPHAGOGASTRODUODENOSCOPY (EGD) WITH PROPOFOL N/A 03/06/2021   Procedure: ESOPHAGOGASTRODUODENOSCOPY (EGD) WITH PROPOFOL;  Surgeon: Yetta Flock, MD;  Location: [facility];  Service: Gastroenterology;  Laterality: N/A;   flexible cystourethroscopy  05/24/06   HEMOSTASIS CLIP PLACEMENT  03/06/2021   Procedure: HEMOSTASIS CLIP PLACEMENT;  Surgeon: Yetta Flock, MD;  Location: [facility];  Service: Gastroenterology;;   HOT HEMOSTASIS N/A 03/06/2021   Procedure: HOT HEMOSTASIS (ARGON PLASMA COAGULATION/BICAP);  Surgeon: Yetta Flock, MD;  Location: [facility];  Service: Gastroenterology;  Laterality: N/A;   INGUINAL HERNIA REPAIR  2005   LUNG LOBECTOMY Right 11/07   RUL   MULTIPLE TOOTH EXTRACTIONS     POLYPECTOMY  11/12/2020   Procedure: POLYPECTOMY;  Surgeon: Doran Stabler, MD;  Location: [facility];  Service: Gastroenterology;;   TRANSURETHRAL RESECTION OF PROSTATE N/A 05/02/2021   Procedure: TRANSURETHRAL RESECTION OF THE PROSTATE (TURP);  Surgeon: Lucas Mallow, MD;  Location: [facility];  Service: Urology;  Laterality: N/A;   UPPER GASTROINTESTINAL ENDOSCOPY       Current Outpatient Medications  Medication Sig Dispense Refill   albuterol (VENTOLIN HFA) 108 (90 Base) MCG/ACT inhaler INHALE 1-2 PUFFS BY MOUTH EVERY 6 HOURS AS NEEDED FOR WHEEZE OR SHORTNESS OF BREATH (Patient taking differently: Inhale 1-2 puffs into the lungs every 6 (six) hours as needed for wheezing or shortness of breath.) 6.7 each 5   HYDROcodone-acetaminophen (NORCO) 7.5-325 MG tablet Take 1 tablet by mouth 2 (two) times daily as needed for moderate pain. 60 tablet 0   Multiple Vitamin (MULTIVITAMIN WITH MINERALS) TABS tablet Take 1 tablet by mouth daily.      rosuvastatin (CRESTOR) 20 MG tablet Take 1 tablet (20 mg total) by mouth daily. 90 tablet 1   senna (SENOKOT) 8.6 MG TABS tablet Take 2 tablets (17.2 mg total) by mouth in the morning and at bedtime. 120 tablet 1   tamsulosin (FLOMAX) 0.4 MG CAPS capsule Take 2 capsules (0.8 mg total) by mouth every evening. 180 capsule 3   ZENPEP 20000-63000 units CPEP TAKE 3 CAPSULES BY MOUTH 3 (THREE) TIMES DAILY BEFORE MEALS. (Patient taking differently: Take 3 capsules by mouth 3 (three) times daily before meals.) 270 capsule 0   pantoprazole (PROTONIX) 40 MG tablet Take 1 tablet (40 mg total) by mouth daily. 90 tablet 1   No current facility-administered medications for this visit.    Allergies:   Candesartan cilexetil-hctz, Hydrochlorothiazide, and Shellfish allergy   Social History:  The patient  reports that he has been smoking cigarettes. He has a 27.50 pack-year smoking history. He has never used smokeless tobacco. He reports that he does not currently use alcohol. He reports that he does not use  drugs.   Family History:  The patient's family history includes Arthritis in his brother; Diabetes in his mother and sister; Gout in his brother; Heart disease in his sister; Hypertension in his sister.    ROS:  Please see the history of present illness.   Otherwise, review of systems is positive for none.   All other systems are reviewed and negative.    PHYSICAL EXAM: VS:  BP (!) 160/82   Pulse 93   Ht 6\' 3"  (1.905 m)   Wt 138 lb 9.6 oz (62.9 kg)   SpO2 99%   BMI 17.32 kg/m  , BMI Body mass index is 17.32 kg/m. GEN: Well nourished, well developed, in no acute distress  HEENT: normal  Neck: no JVD, carotid bruits, or masses Cardiac: irregular; no murmurs, rubs, or gallops,no edema  Respiratory:  clear to auscultation bilaterally, normal work of breathing GI: soft, nontender, nondistended, + BS MS: no deformity or atrophy  Skin: warm and dry Neuro:  Strength and sensation are intact Psych:  euthymic mood, full affect  EKG:  EKG is ordered today. Personal review of the ekg ordered shows sinus rhythm, PACs and PVCs  Recent Labs: 11/07/2020: TSH 0.986 03/05/2021: Magnesium 2.0 03/06/2021: ALT 13 04/27/2021: BUN 13; Creatinine, Ser 1.12; Platelets 132; Potassium 3.4; Sodium 139 05/04/2021: Hemoglobin 8.1    Lipid Panel     Component Value Date/Time   CHOL 74 11/11/2020 1555   CHOL 112 07/13/2018 1002   TRIG 54 11/11/2020 1555   HDL 20 (L) 11/11/2020 1555   HDL 51 07/13/2018 1002   CHOLHDL 3.7 11/11/2020 1555   VLDL 11 11/11/2020 1555   LDLCALC 43 11/11/2020 1555   LDLCALC 51 07/13/2018 1002     Wt Readings from Last 3 Encounters:  05/22/21 138 lb 9.6 oz (62.9 kg)  05/07/21 131 lb 12.8 oz (59.8 kg)  05/02/21 138 lb 4 oz (62.7 kg)      Other studies Reviewed: Additional studies/ records that were reviewed today include: TTE 11/06/20  Review of the above records today demonstrates:   1. Left ventricular ejection fraction, by estimation, is 55 to 60%. The  left ventricle has normal function. Left ventricular endocardial border  not optimally defined to evaluate regional wall motion. There is mild left  ventricular hypertrophy. Left  ventricular diastolic parameters are consistent with Grade I diastolic  dysfunction (impaired relaxation).   2. Right ventricular systolic function was not well visualized. The right  ventricular size is not well visualized. There is normal pulmonary artery  systolic pressure. The estimated right ventricular systolic pressure is  27.0 mmHg.   3. The mitral valve is normal in structure. No evidence of mitral valve  regurgitation. No evidence of mitral stenosis.   4. The aortic valve is tricuspid. Aortic valve regurgitation is not  visualized. No aortic stenosis is present.   5. The inferior vena cava is normal in size with greater than 50%  respiratory variability, suggesting right atrial pressure of 3 mmHg.   6. Technically  difficult study with poor acoustic windows.  Myoview 12/19/2020 Nuclear stress EF: 47%. The left ventricular ejection fraction is mildly decreased (45-54%). There was no ST segment deviation noted during stress. No T wave inversion was noted during stress. The study is normal. This is a low risk study.  ASSESSMENT AND PLAN:  1.  PACs with ectopic atrial rhythm: Found on cardiac monitoring.  CHA2DS2-VASc of at least 4.  His cardiac monitor shows what appears to be atrial  fibrillation, though I am unclear as to whether or not this is atrial fibrillation as ECGs do show organized atrial activity with high burden of ectopy.  At this point, would hold off on anticoagulation until he has his vascular surgery.  He would be at a high risk for anticoagulation at this time.  2.  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  He has been feeling well.  We Naomi Fitton continue with current management.  3.  Coronary artery disease status post stenting: Currently on goal directed medical therapy with aspirin and rosuvastatin.  No beta-blocker due to bradycardia.  4.  Peripheral arterial disease: Currently followed by vascular surgery  5.  Nonobstructive portal vein thrombosis: Currently not anticoagulated due to high bleed risk.  6.  Preoperative evaluation: Has plans for repair of thoracic aortic aneurysm.  Fortunately he has had an echo and a Myoview without major issue a normal ejection fraction and no evidence of ischemia.  He does get short of breath, but does not have chest pain.  I not feel that further evaluation is necessary.  He would be at intermediate risk for this high risk procedure.  We Kelci Petrella continue with current medications.  Case discussed with primary cardiology  Current medicines are reviewed at length with the patient today.   The patient does not have concerns regarding his medicines.  The following changes were made today:  none  Labs/ tests ordered today include:   Orders Placed This Encounter  Procedures   EKG 12-Lead     Disposition:   FU with Rifky Lapre 3 months  Signed, Aliayah Tyer Meredith Leeds, MD  05/22/2021 10:13 AM     [facility] [address] [place] [address] [phone] (office) [phone] (fax)

## 2021-05-22 NOTE — Patient Instructions (Signed)
Medication Instructions:  Your physician recommends that you continue on your current medications as directed. Please refer to the Current Medication list given to you today.  *If you need a refill on your cardiac medications before your next appointment, please call your pharmacy*   Lab Work: None ordered   Testing/Procedures: None ordered   Follow-Up: At [facility], you and your health needs are our priority.  As part of our continuing mission to provide you with exceptional heart care, we have created designated Provider Care Teams.  These Care Teams include your primary Cardiologist (physician) and Advanced Practice Providers (APPs -  Physician Assistants and Nurse Practitioners) who all work together to provide you with the care you need, when you need it.  We recommend signing up for the patient portal called "MyChart".  Sign up information is provided on this After Visit Summary.  MyChart is used to connect with patients for Virtual Visits (Telemedicine).  Patients are able to view lab/test results, encounter notes, upcoming appointments, etc.  Non-urgent messages can be sent to your provider as well.   To learn more about what you can do with MyChart, go to [email or web address].    Your next appointment:   3 month(s)  The format for your next appointment:   In Person  Provider:   Allegra Lai, MD    Thank you for choosing [facility]!!   Trinidad Curet, RN [phone]   Other Instructions

## 2021-05-24 ENCOUNTER — Ambulatory Visit: Payer: Medicare HMO | Admitting: Vascular Surgery

## 2021-05-28 ENCOUNTER — Other Ambulatory Visit: Payer: Self-pay

## 2021-05-28 MED ORDER — ZENPEP 20000-63000 UNITS PO CPEP: 3.0000 | ORAL_CAPSULE | Freq: Three times a day (TID) | ORAL | Status: DC

## 2021-05-30 DIAGNOSIS — R8271 Bacteriuria: Secondary | ICD-10-CM | POA: Diagnosis not present

## 2021-05-31 ENCOUNTER — Ambulatory Visit (INDEPENDENT_AMBULATORY_CARE_PROVIDER_SITE_OTHER): Payer: Medicare HMO | Admitting: Vascular Surgery

## 2021-05-31 ENCOUNTER — Other Ambulatory Visit: Payer: Self-pay

## 2021-05-31 ENCOUNTER — Encounter: Payer: Self-pay | Admitting: Vascular Surgery

## 2021-05-31 VITALS — BP 158/86 | HR 87 | Temp 97.8°F | Resp 20 | Ht 75.0 in | Wt 138.0 lb

## 2021-05-31 DIAGNOSIS — I714 Abdominal aortic aneurysm, without rupture, unspecified: Secondary | ICD-10-CM

## 2021-05-31 NOTE — Progress Notes (Signed)
REASON FOR VISIT:   To discuss endovascular aneurysm repair.  MEDICAL ISSUES:   5.6 CM INFRARENAL ABDOMINAL AORTIC ANEURYSM: This patient has a 5.6 cm infrarenal abdominal aortic aneurysm.  I think the risk of rupture for an aneurysm of this size is 5 to 10% for a year.  He appears to be a candidate for an endovascular repair although we may have to potentially coil one of his iliac arteries if we do not get a good seal as he has a short right common iliac artery. I have discussed the indications for aneurysm repair.  We have discussed the advantages and disadvantages of open versus endovascular repair. The patient wishes to proceed with endovascular aneurysm repair (EVAR). I have discussed the potential complications of EVAR, including, but not limited to: bleeding, infection, arterial injury, graft migration, endoleak, renal failure, MI or other unpredictable medical problems. We have discussed the possibility of having to convert to open repair. We also discussed the need for continued lifelong follow-up after EVAR. All of the patients questions were answered.  The patient still has an indwelling Foley catheter and I explained that I do not think that it is safe to proceed with repair of his aneurysm until this issue is resolved.  He had some problems with recurrent UTIs and would be at high risk for infection of his endovascular graft if we proceed before this issue is resolved.  I will arrange for a follow-up phone visit in 4 to 6 weeks and we can see where we stand on this issue.  CARDIAC PREOPERATIVE CLEARANCE: He has undergone preoperative cardiac clearance.  They felt that based on his echo and Myoview that it would be reasonable to proceed with surgery.  He had a normal ejection fraction and no evidence of ischemia.  He was felt to be intermediate risk.  HPI:   [name] is a pleasant 75 y.o. male who I last saw on 04/12/2021 with a 5.6 cm infrarenal abdominal aortic aneurysm.  He  has a complicated history.  He had been admitted to the hospital in September of this year with an upper GI bleed.  He had active bleeding from the duodenal bulb.  This was successfully treated.  He also has history of benign prostatic hypertrophy and has had a chronic Foley in place because of bladder obstruction.  He was scheduled for a procedure by urology on November 16 as he has had chronic E. coli UTIs.  He has now undergone preoperative cardiac clearance and comes in today to discuss possible repair of his aneurysm.  Patient states that he did undergo his urologic procedure but then still have problems voiding and now has a catheter in place again.  He denies any abdominal pain or back pain.  I have reviewed his films extensively with Simeon Craft and he does appear to be a good candidate for an endovascular aneurysm repair.  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  Past Medical History:  Diagnosis Date   AAA (abdominal aortic aneurysm)    followed by vasc surgery   Alcohol abuse    Barretts esophagus    bx distal esophagus 2011 neg    Bladder outlet obstruction 04/02/2018   CAD in native artery    a. s/p stent to prox RCA and mid Cx.   Cataract    Cholelithiasis  Chronic osteomyelitis involving ankle and foot, right ([place]) 07/13/2018   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   2006 - 3 adenomas largest 12 mm  2008 - no polyps 2013-  2 mm cecal polyp, adenoma, routine repeat colonoscopy 02/2017 approx 05/14/2017 18 polyps max 12 mm ALL adenomas recall 2019 [name], MD, [facility]      Dieulafoy lesion of duodenum    Diverticulosis of colon    DJD (degenerative joint disease)    left hip   Full dentures    upper and lower   GERD (gastroesophageal reflux disease)    diet controlled    Gout    feet   Hiatal hernia    2 cm noted on upper  endos 2011    Hx of adenomatous colonic polyps    Hyperlipidemia    Hypertension    Internal hemorrhoid    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 pt was on Plavix x 3 years then transitioned to ASA, no problems since   Non-small cell carcinoma of right lung, stage 1 ([place]) 01/30/2016   Renal failure, acute ([facility])    hx of 2/2 medication, resolved   Tobacco abuse    smoker .5 ppd x 55 yrs   UTI (lower urinary tract infection)     Family History  Problem Relation Age of Onset   Diabetes Mother    Hypertension Sister    Heart disease Sister    Diabetes Sister    Arthritis Brother    Gout Brother    Colon cancer Neg Hx    Esophageal cancer Neg Hx    Rectal cancer Neg Hx    Stomach cancer Neg Hx    Colon polyps Neg Hx     SOCIAL HISTORY: Social History   Tobacco Use   Smoking status: Every Day    Packs/day: 0.50    Years: 55.00    Pack years: 27.50    Types: Cigarettes   Smokeless tobacco: Never  Substance Use Topics   Alcohol use: Not Currently    Comment: drinking last drink in May. 1-2 pts gin/week, (mixed with tap water not tonic)    Allergies  Allergen Reactions   Candesartan Cilexetil-Hctz Other (See Comments)    Acute renal failure   Hydrochlorothiazide Other (See Comments)    Acute renal failure   Shellfish Allergy Other (See Comments)    Gout occurs    Current Outpatient Medications  Medication Sig Dispense Refill   albuterol (VENTOLIN HFA) 108 (90 Base) MCG/ACT inhaler INHALE 1-2 PUFFS BY MOUTH EVERY 6 HOURS AS NEEDED FOR WHEEZE OR SHORTNESS OF BREATH (Patient taking differently: Inhale 1-2 puffs into the lungs every 6 (six) hours as needed for wheezing or shortness of breath.) 6.7 each 5   HYDROcodone-acetaminophen (NORCO) 7.5-325 MG tablet Take 1 tablet by mouth 2 (two) times daily as needed for moderate pain. 60 tablet 0   Multiple Vitamin  (MULTIVITAMIN WITH MINERALS) TABS tablet Take 1 tablet by mouth daily.     Pancrelipase, Lip-Prot-Amyl, (ZENPEP) 20000-63000 units CPEP Take 3 capsules by mouth 3 (three) times daily before meals.     rosuvastatin (CRESTOR) 20 MG tablet Take 1 tablet (20 mg total) by mouth daily. 90 tablet 1   senna (SENOKOT) 8.6 MG TABS tablet Take 2 tablets (17.2 mg total) by mouth in the morning and at bedtime. [place]  tablet 1   tamsulosin (FLOMAX) 0.4 MG CAPS capsule Take 2 capsules (0.8 mg total) by mouth every evening. 180 capsule 3   pantoprazole (PROTONIX) 40 MG tablet Take 1 tablet (40 mg total) by mouth daily. 90 tablet 1   No current facility-administered medications for this visit.    REVIEW OF SYSTEMS:  [X]  denotes positive finding, [ ]  denotes negative finding Cardiac  Comments:  Chest pain or chest pressure:    Shortness of breath upon exertion:    Short of breath when lying flat:    Irregular heart rhythm:        Vascular    Pain in calf, thigh, or hip brought on by ambulation:    Pain in feet at night that wakes you up from your sleep:     Blood clot in your veins:    Leg swelling:         Pulmonary    Oxygen at home:    Productive cough:     Wheezing:         Neurologic    Sudden weakness in arms or legs:     Sudden numbness in arms or legs:     Sudden onset of difficulty speaking or slurred speech:    Temporary loss of vision in one eye:     Problems with dizziness:         Gastrointestinal    Blood in stool:     Vomited blood:         Genitourinary    Burning when urinating:     Blood in urine:        Psychiatric    Major depression:         Hematologic    Bleeding problems:    Problems with blood clotting too easily:        Skin    Rashes or ulcers:        Constitutional    Fever or chills:     PHYSICAL EXAM:   Vitals:   05/31/21 1011  BP: (!) 158/86  Pulse: 87  Resp: 20  Temp: 97.8 F (36.6 C)  SpO2: 95%  Weight: 138 lb (62.6 kg)  Height: 6\' 3"   (1.905 m)    GENERAL: The patient is a well-nourished male, in no acute distress. The vital signs are documented above. CARDIAC: There is a regular rate and rhythm.  VASCULAR: He has palpable femoral pulses. I cannot palpate pedal pulses. PULMONARY: There is good air exchange bilaterally without wheezing or rales. ABDOMEN: Soft and non-tender with normal pitched bowel sounds.  His aneurysm is palpable and nontender. MUSCULOSKELETAL: There are no major deformities or cyanosis. NEUROLOGIC: No focal weakness or paresthesias are detected. SKIN: There are no ulcers or rashes noted. PSYCHIATRIC: The patient has a normal affect.  DATA:    No new data  Deitra Mayo [facility] [phone]

## 2021-06-01 ENCOUNTER — Other Ambulatory Visit: Payer: Self-pay | Admitting: Student in an Organized Health Care Education/Training Program

## 2021-06-01 ENCOUNTER — Institutional Professional Consult (permissible substitution) (HOSPITAL_BASED_OUTPATIENT_CLINIC_OR_DEPARTMENT_OTHER): Payer: Medicare HMO | Admitting: Internal Medicine

## 2021-06-01 DIAGNOSIS — M25551 Pain in right hip: Secondary | ICD-10-CM

## 2021-06-01 DIAGNOSIS — Z79891 Long term (current) use of opiate analgesic: Secondary | ICD-10-CM

## 2021-06-01 MED ORDER — HYDROCODONE-ACETAMINOPHEN 7.5-325 MG PO TABS: 1.0000 | ORAL_TABLET | Freq: Two times a day (BID) | ORAL | 0 refills | Status: DC | PRN

## 2021-06-01 NOTE — Telephone Encounter (Signed)
Refill Request   HYDROcodone-acetaminophen (NORCO) 7.5-325 MG tablet   [id number] - [place], [place] - [address] (Ph: [phone])

## 2021-06-04 ENCOUNTER — Other Ambulatory Visit: Payer: Self-pay

## 2021-06-04 MED ORDER — ZENPEP 20000-63000 UNITS PO CPEP: 3.0000 | ORAL_CAPSULE | Freq: Three times a day (TID) | ORAL | 3 refills | Status: DC

## 2021-06-05 ENCOUNTER — Other Ambulatory Visit: Payer: Self-pay

## 2021-06-05 MED ORDER — ZENPEP 20000-63000 UNITS PO CPEP: 3.0000 | ORAL_CAPSULE | Freq: Three times a day (TID) | ORAL | 3 refills | Status: DC

## 2021-06-05 NOTE — Telephone Encounter (Signed)
Pancrelipase, Lip-Prot-Amyl, (ZENPEP) 20000-63000 units CPEP, refill request @ [id number] - [place], [place] - [address] AT [place].

## 2021-06-06 ENCOUNTER — Telehealth: Payer: Self-pay | Admitting: Student in an Organized Health Care Education/Training Program

## 2021-06-06 NOTE — Telephone Encounter (Signed)
Return pt's call - he wanted to know if he can pick up hydrocodone and zenpap at the same time - informed [facility]. Stated he will.

## 2021-06-06 NOTE — Telephone Encounter (Signed)
Refill Request- The patient would like call back about when the following medication is due to be refilled.   HYDROcodone-acetaminophen (NORCO) 7.5-325 MG tablet   [id number] - [place], [place] - [address] (Ph: [phone])

## 2021-06-07 ENCOUNTER — Ambulatory Visit: Payer: Medicare HMO | Admitting: Vascular Surgery

## 2021-06-14 ENCOUNTER — Other Ambulatory Visit: Payer: Self-pay

## 2021-06-14 MED ORDER — ROSUVASTATIN CALCIUM 20 MG PO TABS: 20.0000 mg | ORAL_TABLET | Freq: Every day | ORAL | 1 refills | Status: DC

## 2021-06-14 NOTE — Telephone Encounter (Signed)
Next appt scheduled 08/06/21 with PCP.

## 2021-06-14 NOTE — Telephone Encounter (Signed)
rosuvastatin (CRESTOR) 20 MG tablet, REFILL REQUEST @ [id number] - [place], [place] - [address] AT [place].

## 2021-06-18 ENCOUNTER — Other Ambulatory Visit: Payer: Self-pay

## 2021-06-18 ENCOUNTER — Encounter (HOSPITAL_COMMUNITY): Payer: Self-pay | Admitting: Emergency Medicine

## 2021-06-18 ENCOUNTER — Emergency Department (HOSPITAL_COMMUNITY)
Admission: EM | Admit: 2021-06-18 | Discharge: 2021-06-19 | Disposition: A | Discharge status: Home / Self Care | Payer: Medicare HMO | Attending: Emergency Medicine | Admitting: Emergency Medicine

## 2021-06-18 ENCOUNTER — Emergency Department (HOSPITAL_COMMUNITY): Payer: Medicare HMO

## 2021-06-18 DIAGNOSIS — R5383 Other fatigue: Secondary | ICD-10-CM | POA: Insufficient documentation

## 2021-06-18 DIAGNOSIS — Z20822 Contact with and (suspected) exposure to covid-19: Secondary | ICD-10-CM | POA: Diagnosis not present

## 2021-06-18 DIAGNOSIS — R9431 Abnormal electrocardiogram [ECG] [EKG]: Secondary | ICD-10-CM | POA: Diagnosis not present

## 2021-06-18 DIAGNOSIS — N3000 Acute cystitis without hematuria: Secondary | ICD-10-CM | POA: Diagnosis not present

## 2021-06-18 DIAGNOSIS — I7143 Infrarenal abdominal aortic aneurysm, without rupture: Secondary | ICD-10-CM | POA: Insufficient documentation

## 2021-06-18 DIAGNOSIS — R1111 Vomiting without nausea: Secondary | ICD-10-CM | POA: Diagnosis not present

## 2021-06-18 DIAGNOSIS — R531 Weakness: Secondary | ICD-10-CM | POA: Diagnosis not present

## 2021-06-18 DIAGNOSIS — Z5321 Procedure and treatment not carried out due to patient leaving prior to being seen by health care provider: Secondary | ICD-10-CM | POA: Insufficient documentation

## 2021-06-18 DIAGNOSIS — R110 Nausea: Secondary | ICD-10-CM | POA: Diagnosis not present

## 2021-06-18 DIAGNOSIS — I714 Abdominal aortic aneurysm, without rupture, unspecified: Secondary | ICD-10-CM | POA: Diagnosis not present

## 2021-06-18 LAB — CBC
HCT: 34.9 % — ABNORMAL LOW (ref 39.0–52.0)
Hemoglobin: 9.9 g/dL — ABNORMAL LOW (ref 13.0–17.0)
MCH: 27.3 pg (ref 26.0–34.0)
MCHC: 28.4 g/dL — ABNORMAL LOW (ref 30.0–36.0)
MCV: 96.4 fL (ref 80.0–100.0)
Platelets: 144 10*3/uL — ABNORMAL LOW (ref 150–400)
RBC: 3.62 MIL/uL — ABNORMAL LOW (ref 4.22–5.81)
RDW: 16.7 % — ABNORMAL HIGH (ref 11.5–15.5)
WBC: 5.2 10*3/uL (ref 4.0–10.5)
nRBC: 0 % (ref 0.0–0.2)

## 2021-06-18 LAB — COMPREHENSIVE METABOLIC PANEL
ALT: 20 U/L (ref 0–44)
AST: 29 U/L (ref 15–41)
Albumin: 2.8 g/dL — ABNORMAL LOW (ref 3.5–5.0)
Alkaline Phosphatase: 52 U/L (ref 38–126)
Anion gap: 12 (ref 5–15)
BUN: 22 mg/dL (ref 8–23)
CO2: 21 mmol/L — ABNORMAL LOW (ref 22–32)
Calcium: 9.6 mg/dL (ref 8.9–10.3)
Chloride: 105 mmol/L (ref 98–111)
Creatinine, Ser: 1.11 mg/dL (ref 0.61–1.24)
GFR, Estimated: >60 mL/min (ref >60)
Glucose, Bld: 87 mg/dL (ref 70–99)
Potassium: 3.4 mmol/L — ABNORMAL LOW (ref 3.5–5.1)
Sodium: 138 mmol/L (ref 135–145)
Total Bilirubin: 1.1 mg/dL (ref 0.3–1.2)
Total Protein: 7.1 g/dL (ref 6.5–8.1)

## 2021-06-18 LAB — LIPASE, BLOOD: Lipase: 30 U/L (ref 11–51)

## 2021-06-18 NOTE — ED Triage Notes (Signed)
Patient coming from home, fatigued, no appetite for 5 days. VSS. A&Ox4.

## 2021-06-18 NOTE — ED Provider Notes (Signed)
Emergency Medicine Provider Triage Evaluation Note  [name] , a 76 y.o. male  was evaluated in triage.  Pt complains of generalized weakness and fatigue over the last week.  Also reports associated generalized abdominal pain.  No fever, chills, chest pain, shortness of breath, nausea, vomiting, diarrhea.  Does report mild sore throat.  Review of Systems  Positive:  Negative: See above   Physical Exam  BP 94/73 (BP Location: Left Arm)    Pulse (!) 103    Temp 97.7 F (36.5 C) (Oral)    Resp 18    SpO2 95%  Gen:   Awake, no distress   Resp:  Normal effort  MSK:   Moves extremities without difficulty  Other:  No abdominal tenderness  Medical Decision Making  Medically screening exam initiated at 2:18 PM.  Appropriate orders placed.  [name] was informed that the remainder of the evaluation will be completed by another provider, this initial triage assessment does not replace that evaluation, and the importance of remaining in the ED until their evaluation is complete.     Myna Bright [place], PA-C 06/18/21 1420    Godfrey Pick, MD 06/20/21 [phone]

## 2021-06-18 NOTE — ED Notes (Signed)
Pt called multiple times for repeat vitas, no answer

## 2021-06-19 ENCOUNTER — Other Ambulatory Visit: Payer: Self-pay

## 2021-06-19 ENCOUNTER — Emergency Department (HOSPITAL_BASED_OUTPATIENT_CLINIC_OR_DEPARTMENT_OTHER): Payer: Medicare HMO

## 2021-06-19 ENCOUNTER — Encounter (HOSPITAL_BASED_OUTPATIENT_CLINIC_OR_DEPARTMENT_OTHER): Payer: Self-pay | Admitting: Emergency Medicine

## 2021-06-19 ENCOUNTER — Emergency Department (HOSPITAL_BASED_OUTPATIENT_CLINIC_OR_DEPARTMENT_OTHER)
Admission: EM | Admit: 2021-06-19 | Discharge: 2021-06-19 | Disposition: A | Discharge status: Home / Self Care | Payer: Medicare HMO | Source: Home / Self Care | Attending: Emergency Medicine | Admitting: Emergency Medicine

## 2021-06-19 DIAGNOSIS — N281 Cyst of kidney, acquired: Secondary | ICD-10-CM | POA: Diagnosis not present

## 2021-06-19 DIAGNOSIS — I7143 Infrarenal abdominal aortic aneurysm, without rupture: Secondary | ICD-10-CM | POA: Diagnosis not present

## 2021-06-19 DIAGNOSIS — R197 Diarrhea, unspecified: Secondary | ICD-10-CM | POA: Diagnosis not present

## 2021-06-19 DIAGNOSIS — Z20822 Contact with and (suspected) exposure to covid-19: Secondary | ICD-10-CM | POA: Insufficient documentation

## 2021-06-19 DIAGNOSIS — I714 Abdominal aortic aneurysm, without rupture, unspecified: Secondary | ICD-10-CM | POA: Diagnosis not present

## 2021-06-19 DIAGNOSIS — N3000 Acute cystitis without hematuria: Secondary | ICD-10-CM

## 2021-06-19 DIAGNOSIS — K5900 Constipation, unspecified: Secondary | ICD-10-CM | POA: Diagnosis not present

## 2021-06-19 DIAGNOSIS — R5383 Other fatigue: Secondary | ICD-10-CM | POA: Diagnosis not present

## 2021-06-19 DIAGNOSIS — R9431 Abnormal electrocardiogram [ECG] [EKG]: Secondary | ICD-10-CM | POA: Diagnosis not present

## 2021-06-19 DIAGNOSIS — I723 Aneurysm of iliac artery: Secondary | ICD-10-CM | POA: Diagnosis not present

## 2021-06-19 DIAGNOSIS — K8020 Calculus of gallbladder without cholecystitis without obstruction: Secondary | ICD-10-CM | POA: Diagnosis not present

## 2021-06-19 LAB — URINALYSIS, ROUTINE W REFLEX MICROSCOPIC
Bilirubin Urine: NEGATIVE
Glucose, UA: NEGATIVE mg/dL
Ketones, ur: NEGATIVE mg/dL
Nitrite: POSITIVE — AB
Protein, ur: 30 mg/dL — AB
Specific Gravity, Urine: 1.019 (ref 1.005–1.030)
WBC, UA: >50 WBC/hpf — ABNORMAL HIGH (ref 0–5)
pH: 6 (ref 5.0–8.0)

## 2021-06-19 LAB — RESP PANEL BY RT-PCR (FLU A&B, COVID) ARPGX2
Influenza A by PCR: NEGATIVE
Influenza B by PCR: NEGATIVE
SARS Coronavirus 2 by RT PCR: NEGATIVE

## 2021-06-19 MED ORDER — SODIUM CHLORIDE 0.9 % IV SOLN
1.0000 g | Freq: Once | INTRAVENOUS | Status: AC
  Administered 2021-06-19: 1 g via INTRAVENOUS
  Filled 2021-06-19: qty 10

## 2021-06-19 MED ORDER — CIPROFLOXACIN HCL 500 MG PO TABS: 500.0000 mg | ORAL_TABLET | Freq: Two times a day (BID) | ORAL | 0 refills | Status: DC

## 2021-06-19 MED ORDER — SODIUM CHLORIDE 0.9 % IV BOLUS
1000.0000 mL | Freq: Once | INTRAVENOUS | Status: AC
  Administered 2021-06-19: 1000 mL via INTRAVENOUS

## 2021-06-19 MED ORDER — OXYCODONE-ACETAMINOPHEN 5-325 MG PO TABS
1.0000 | ORAL_TABLET | Freq: Once | ORAL | Status: AC
  Administered 2021-06-19: 1 via ORAL
  Filled 2021-06-19: qty 1

## 2021-06-19 MED ORDER — IOHEXOL 350 MG/ML SOLN
75.0000 mL | Freq: Once | INTRAVENOUS | Status: AC | PRN
  Administered 2021-06-19: 75 mL via INTRAVENOUS

## 2021-06-19 MED ORDER — ACETAMINOPHEN ER 650 MG PO TBCR: 650.0000 mg | EXTENDED_RELEASE_TABLET | Freq: Three times a day (TID) | ORAL | 0 refills | Status: DC | PRN

## 2021-06-19 MED ORDER — CIPROFLOXACIN HCL 500 MG PO TABS
500.0000 mg | ORAL_TABLET | Freq: Once | ORAL | Status: AC
  Administered 2021-06-19: 500 mg via ORAL
  Filled 2021-06-19: qty 1

## 2021-06-19 NOTE — ED Notes (Signed)
Pt's shows to be in AFIB and Pt's HR is between 63-70's. EKG taken and given to MD

## 2021-06-19 NOTE — ED Notes (Signed)
Patient transported to CT 

## 2021-06-19 NOTE — ED Notes (Signed)
Back from CT

## 2021-06-19 NOTE — ED Triage Notes (Addendum)
EMS report: Pt via ems from home with alternating constipation and loose stools x 3 days. He has "prostate issues" possible a-fib, MI in 2004. Pt lives alone.  140 pressure palpated 68pulse 16RR 100% 123CBG 98.1 temp   Pt has foley catheter, has had since May

## 2021-06-19 NOTE — Discharge Instructions (Addendum)
You are seen in the ER for weakness and abdominal pain.  Our work-up reveals that you likely have infection of your bladder.  Please start taking the antibiotics that are prescribed .  Given that you have known history of aortic aneurysm, we are also ordered a CT scan.  That CT scan reveals that your aneurysm has gotten larger.  Unfortunately, the vascular doctors can offer you procedure once the Foley catheter has been removed.  Please contact the urologist for an appointment for next week to see if the catheter can come out.  Once the catheter is removed, call the vascular surgeons to set up an appointment with them.  Return to the ER immediately if you start having severe pain, severe nausea and vomiting, fevers.

## 2021-06-19 NOTE — ED Notes (Signed)
Patient verbalizes understanding of discharge instructions. Opportunity for questioning and answers were provided. Patient discharged from ED with family.

## 2021-06-19 NOTE — ED Notes (Signed)
Have been able to get IV access and still trying to obtain same. Charge notified

## 2021-06-19 NOTE — ED Notes (Signed)
Pt denis pain but states that he is sleepy.

## 2021-06-19 NOTE — ED Provider Notes (Signed)
[facility] EMERGENCY DEPT Provider Note   CSN: [id number] Arrival date & time: 06/19/21  0867     History  No chief complaint on file.   [name] is a 76 y.o. male.  HPI    76 year old male comes in with chief complaint of abdominal discomfort, abnormal bowel movements, generalized weakness.  Patient has history of BPH status post TURP on 05-02-2021.  He has a Foley catheter placed since.  He also has history of AAA, A. Fib.  Patient resides by himself.  He reports that over the last few days he has been having some generalized weakness.  More recently has been having alternating bouts of constipation and loose BM -but mostly constipation.  He has had urge to go with no defecation.  He also is having some low back pain.  Patient had come to [facility], ER yesterday, but left without being seen.  Home Medications Prior to Admission medications   Medication Sig Start Date End Date Taking? Authorizing Provider  acetaminophen (TYLENOL 8 HOUR) 650 MG CR tablet Take 1 tablet (650 mg total) by mouth every 8 (eight) hours as needed for pain or fever. 06/19/21  Yes Varney Biles, MD  ciprofloxacin (CIPRO) 500 MG tablet Take 1 tablet (500 mg total) by mouth every 12 (twelve) hours. 06/19/21  Yes Taras Rask, MD  albuterol (VENTOLIN HFA) 108 (90 Base) MCG/ACT inhaler INHALE 1-2 PUFFS BY MOUTH EVERY 6 HOURS AS NEEDED FOR WHEEZE OR SHORTNESS OF BREATH Patient taking differently: Inhale 1-2 puffs into the lungs every 6 (six) hours as needed for wheezing or shortness of breath. 02/13/21   Axel Filler, MD  HYDROcodone-acetaminophen ([place]) 7.5-325 MG tablet Take 1 tablet by mouth 2 (two) times daily as needed for moderate pain. 06/01/21   Axel Filler, MD  Multiple Vitamin (MULTIVITAMIN WITH MINERALS) TABS tablet Take 1 tablet by mouth daily.    [provider]  Pancrelipase, Lip-Prot-Amyl, (ZENPEP) 20000-63000 units CPEP Take 3 capsules by mouth 3  (three) times daily before meals. 06/05/21   Axel Filler, MD  pantoprazole (PROTONIX) 40 MG tablet Take 1 tablet (40 mg total) by mouth daily. 04/04/21 05/04/21  Axel Filler, MD  rosuvastatin (CRESTOR) 20 MG tablet Take 1 tablet (20 mg total) by mouth daily. 06/14/21   Axel Filler, MD  senna (SENOKOT) 8.6 MG TABS tablet Take 2 tablets (17.2 mg total) by mouth in the morning and at bedtime. 05/07/21   Axel Filler, MD  tamsulosin (FLOMAX) 0.4 MG CAPS capsule Take 2 capsules (0.8 mg total) by mouth every evening. 11/27/20   Axel Filler, MD      Allergies    Candesartan cilexetil-hctz, Hydrochlorothiazide, and Shellfish allergy    Review of Systems   Review of Systems  Constitutional:  Positive for activity change.  Gastrointestinal:  Positive for abdominal pain and constipation. Negative for nausea and vomiting.  Allergic/Immunologic: Negative for immunocompromised state.  Hematological:  Bruises/bleeds easily.   Physical Exam Updated Vital Signs BP (!) 165/98    Pulse 61    Temp 98.3 F (36.8 C) (Oral)    Resp (!) 26    Wt 55.7 kg    SpO2 100%    BMI 15.35 kg/m  Physical Exam Vitals and nursing note reviewed.  Constitutional:      Appearance: He is well-developed.  HENT:     Head: Atraumatic.  Cardiovascular:     Rate and Rhythm: Normal rate.  Pulmonary:  Effort: Pulmonary effort is normal.  Abdominal:     Tenderness: There is abdominal tenderness. There is no guarding or rebound.     Comments: Palpable pulse over the abdomen  Musculoskeletal:     Cervical back: Neck supple.  Skin:    General: Skin is warm.  Neurological:     Mental Status: He is alert and oriented to person, place, and time.    ED Results / Procedures / Treatments   Labs (all labs ordered are listed, but only abnormal results are displayed) Labs Reviewed  URINALYSIS, ROUTINE W REFLEX MICROSCOPIC - Abnormal; Notable for the following components:       Result Value   APPearance HAZY (*)    Hgb urine dipstick TRACE (*)    Protein, ur 30 (*)    Nitrite POSITIVE (*)    Leukocytes,Ua LARGE (*)    WBC, UA >50 (*)    Bacteria, UA MANY (*)    All other components within normal limits  RESP PANEL BY RT-PCR (FLU A&B, COVID) [id number]  URINE CULTURE    EKG EKG Interpretation  Date/Time:  Tuesday June 19 2021 12:45:24 EST Ventricular Rate:  71 PR Interval:  176 QRS Duration: 128 QT Interval:  448 QTC Calculation: 437 R Axis:   -63 Text Interpretation: Sinus rhythm Supraventricular bigeminy Left bundle branch block No acute changes likely afib in the previous ekg Confirmed by Varney Biles [phone]) on 06/19/2021 1:57:59 PM  Radiology DG Chest 2 View  Result Date: 06/18/2021 CLINICAL DATA:  Fatigue. No appetite for 5 days. Possible pneumonia. EXAM: CHEST - 2 VIEW COMPARISON:  11/06/2020. FINDINGS: Cardiac silhouette is normal in size. Prominent aortic arch. No mediastinal or hilar masses or evidence of adenopathy. Mild elevation of the right hemidiaphragm, stable. Clear lungs.  No pleural effusion or pneumothorax. Skeletal structures are intact. IMPRESSION: No active cardiopulmonary disease. Electronically Signed   By: Lajean Manes M.D.   On: 06/18/2021 15:04   CT Angio Abd/Pel W and/or Wo Contrast  Result Date: 06/19/2021 CLINICAL DATA:  Aortic aneurysm, surveillance EXAM: CT ANGIOGRAPHY ABDOMEN AND PELVIS WITH CONTRAST AND WITHOUT CONTRAST TECHNIQUE: Multidetector CT imaging of the abdomen and pelvis was performed using the standard protocol during bolus administration of intravenous contrast. Multiplanar reconstructed images and MIPs were obtained and reviewed to evaluate the vascular anatomy. CONTRAST:  109mL OMNIPAQUE IOHEXOL 350 MG/ML SOLN COMPARISON:  03/06/2021 FINDINGS: VASCULAR Coronary calcifications. Aorta: Ectatic visualized distal descending thoracic segment up to 3.2 cm diameter, suprarenal segment ectatic up to 3.3 cm diameter.  Fusiform infrarenal Aortic aneurysm (ICD10-I71.9) 6.1 x 5.5 cm maximum transverse dimensions (previously 5.9 x 5.4 by my measurement). Celiac: Patent without evidence of aneurysm, dissection, vasculitis or significant stenosis. SMA: Calcified ostial plaque without significant stenosis, ectatic and widely patent distally with classic distal branch anatomy. Renals: Duplicated left, superior dominant, atheromatous but patent. Single right, atheromatous but patent. IMA: Patent, arising from the aneurysmal segment of the aorta Inflow: 2.1 cm fusiform dilatation of the right common iliac aneurysm, 1.5 cm left fusiform common iliac aneurysm. Ectatic mildly tortuous external and internal iliac arteries with moderate scattered calcified plaque, incompletely opacified. Proximal Outflow: Atheromatous, without definite stenosis, evaluation limited due to poor contrast enhancement Veins: No obvious venous abnormality within the limitations of this arterial phase study. Review of the MIP images confirms the above findings. NON-VASCULAR Lower chest: No pleural or pericardial effusion. Emphysema (ICD10-J43.9). Hepatobiliary: 1.6 cm partially calcified stone in the gallbladder neck. Smaller layering hyperdense stones in the gallbladder  fundus. No hepatic lesion or significant biliary ductal dilatation. Pancreas: Unremarkable. No pancreatic ductal dilatation or surrounding inflammatory changes. Spleen: Normal in size without focal abnormality. Adrenals/Urinary Tract: Adrenal glands unremarkable. Stable bilateral renal cysts. No hydronephrosis. Foley catheter decompresses urinary bladder. Stomach/Bowel: The stomach is incompletely distended, unremarkable. Small bowel decompressed. The colon is nondilated. Fecal distention of the rectum. Lymphatic: No abdominal or pelvic adenopathy. Reproductive: Mild prostate enlargement. Other: No ascites.  No free air. Musculoskeletal: Multilevel lumbar spondylitic change. 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 No acute fracture or worrisome bone lesion. IMPRESSION: 1. 6.1 cm fusiform infrarenal abdominal aortic aneurysm, previously 5.9. Recommend referral to a vascular specialist. This recommendation follows ACR consensus guidelines: White Paper of the ACR Incidental Findings Committee II on Vascular Findings. J Am Coll Radiol 2013; 10:789-794. 2. 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 3. Cholelithiasis Electronically Signed   By: Lucrezia Europe M.D.   On: 06/19/2021 12:56    Procedures .Critical Care Performed by: Varney Biles, MD Authorized by: Varney Biles, MD   Critical care provider statement:    Critical care time (minutes):  40   Critical care was necessary to treat or prevent imminent or life-threatening deterioration of the following conditions: AAA.   Critical care was time spent personally by me on the following activities:  Development of treatment plan with patient or surrogate, discussions with consultants, evaluation of patient's response to treatment, examination of patient, ordering and review of laboratory studies, ordering and review of radiographic studies, ordering and performing treatments and interventions, pulse oximetry, re-evaluation of patient's condition and review of old charts    Medications Ordered in ED Medications  cefTRIAXone (ROCEPHIN) 1 g in sodium chloride 0.9 % 100 mL IVPB (1 g Intravenous New Bag/Given 06/19/21 1411)  ciprofloxacin (CIPRO) tablet 500 mg (has no administration in time range)  oxyCODONE-acetaminophen (PERCOCET/ROXICET) 5-325 MG per tablet 1 tablet (has no administration in time range)  sodium chloride 0.9 % bolus 1,000 mL (1,000 mLs Intravenous New Bag/Given 06/19/21 1056)  iohexol (OMNIPAQUE) 350 MG/ML injection 75 mL (75 mLs Intravenous Contrast Given 06/19/21 1152)    ED Course/ Medical Decision Making/ A&P Clinical Course as of 06/19/21 1413  Tue Jun 19, 2021  1410 Nitrite(!): POSITIVE UA is nitrite positive,  leukocyte positive with pyuria and bacteriuria.  Clean sample.  Most likely the symptoms of back pain, lower quadrant abdominal pain is because of it.  Previous susceptibilities reviewed, patient had E. coli that was susceptible to Cipro and Bactrim.  We will start him on Cipro. [AN]  1410 CT Angio Abd/Pel W and/or Wo Contrast CT scan reviewed.  The aortic aneurysm has gotten larger.  There is no rupture.  Case discussed with Dr. Doren Custard, vascular surgery.  He indicates that given the risk for infection of the stent, until the Foley catheter comes out they cannot offer an elective procedure to the patient.  They would be happy to see the patient as soon as the Foley catheter is removed.  At this time, he doubts that the pain is secondary to the aneurysm given the change is about 0.3 cm in a matter of several weeks.  I agree, that most likely the pain is because of the infection.  We will call urology to ensure that patient gets prompt follow-up. [AN]  [id number] Spoke with Dr. Tresa Moore, urology.  They will contact the patient to set up an appointment for trial of Foley catheter removal as soon as possible.  He was  made aware of the aneurysm and the potential delay in the repair given the Foley catheter.  Apparently patient had failed a trial of Foley catheter removal earlier. [AN]    Clinical Course User Index [AN] Varney Biles, MD                           Medical Decision Making  This patient presents to the ED for concern of generalized weakness, abdominal discomfort, abnormal bowel movement, this involves an extensive number of treatment options, and is a complaint that carries with it a high risk of complications and morbidity.    The differential diagnosis includes UTI, pyelonephritis given the chronic indwelling Foley catheter, pain related to AAA or leaking aneurysm with retroperitoneal bleed, ileus secondary to infection or AAA.  Comorbidities that complicate the patient evaluation: AAA, recent  TURP with chronic indwelling Foley catheter Patients presentation is complicated by their history of Foley catheter placement  Social Determinants of Health: Patients poor social supports this increases the complexity of managing their presentation  Additional history obtained: Records reviewed previous admission documents, Care Everywhere/External Records, and Primary Care Documents  Lab Tests: I Ordered, and personally interpreted labs.  The pertinent results include: Urine analysis which is nitrite, leukocyte positive with bacteriuria and pyuria. Labs from yesterday reviewed.  No repeat metabolic profile or CBC sent because of it.  Imaging Studies ordered: I ordered imaging studies including CT scan of the abdomen and pelvis, particularly looking at the aneurysm I independently visualized and interpreted imaging which showed infra abdominal aneurysm, larger than before. I agree with the radiologist interpretation.  Cardiac Monitoring: The patient was maintained on a cardiac monitor.  I personally viewed and interpreted the cardiac monitor which showed an underlying rhythm of:  sinus rhythm  Medicines ordered and prescription drug management: I ordered medication including parenteral medications for pain control  Reevaluation of the patient after these medicines showed that the patient    improved. Also gave him oral Cipro for UTI.   Critical Interventions:  -Discussion with both urologist and vascular surgeon to help coordinate removal of the Foley catheter soon as possible so that vascular surgery can consider proceeding with stent placement in his aneurysm.  Consultations Obtained: I requested consultation with the consultant : Urologist and vascular surgery, and discussed  findings as well as pertinent plan -the recommendations are listed in the workflow above.  Reevaluation: After the interventions noted above, I reevaluated the patient and found that they have  :improved  Complexity of problems addressed: Patients presentation is most consistent with  acute presentation with potential threat to life or bodily function and severe exacerbation of chronic illness  Disposition: After consideration of the diagnostic results and the patients response to treatment,  I feel that the patent would benefit from discharge -needs prompt outpatient urology follow-up followed by vascular surgery once the catheter is removed.   Final Clinical Impression(s) / ED Diagnoses Final diagnoses:  Acute cystitis without hematuria  Infrarenal abdominal aortic aneurysm (AAA) without rupture    Rx / DC Orders ED Discharge Orders          Ordered    acetaminophen (TYLENOL 8 HOUR) 650 MG CR tablet  Every 8 hours PRN        06/19/21 1343    ciprofloxacin (CIPRO) 500 MG tablet  Every 12 hours        06/19/21 1409  Varney Biles, MD 06/19/21 [id number]

## 2021-06-21 ENCOUNTER — Emergency Department (HOSPITAL_COMMUNITY)
Admission: EM | Admit: 2021-06-21 | Discharge: 2021-06-22 | Disposition: A | Discharge status: Home / Self Care | Payer: Medicare HMO | Attending: Emergency Medicine | Admitting: Emergency Medicine

## 2021-06-21 ENCOUNTER — Other Ambulatory Visit: Payer: Self-pay

## 2021-06-21 ENCOUNTER — Encounter (HOSPITAL_COMMUNITY): Payer: Self-pay

## 2021-06-21 DIAGNOSIS — R110 Nausea: Secondary | ICD-10-CM | POA: Diagnosis not present

## 2021-06-21 DIAGNOSIS — R109 Unspecified abdominal pain: Secondary | ICD-10-CM | POA: Diagnosis not present

## 2021-06-21 DIAGNOSIS — Z5321 Procedure and treatment not carried out due to patient leaving prior to being seen by health care provider: Secondary | ICD-10-CM | POA: Insufficient documentation

## 2021-06-21 DIAGNOSIS — R531 Weakness: Secondary | ICD-10-CM | POA: Diagnosis not present

## 2021-06-21 DIAGNOSIS — R1084 Generalized abdominal pain: Secondary | ICD-10-CM | POA: Diagnosis not present

## 2021-06-21 DIAGNOSIS — I10 Essential (primary) hypertension: Secondary | ICD-10-CM | POA: Diagnosis not present

## 2021-06-21 DIAGNOSIS — R1030 Lower abdominal pain, unspecified: Secondary | ICD-10-CM | POA: Diagnosis not present

## 2021-06-21 DIAGNOSIS — R300 Dysuria: Secondary | ICD-10-CM | POA: Diagnosis not present

## 2021-06-21 MED ORDER — ACETAMINOPHEN 325 MG PO TABS
650.0000 mg | ORAL_TABLET | Freq: Once | ORAL | Status: AC
Start: 2021-06-21 — End: 2021-06-21
  Administered 2021-06-21: 650 mg via ORAL
  Filled 2021-06-21: qty 2

## 2021-06-21 NOTE — ED Notes (Signed)
I attempted twice to collect labs and was unsuccessful 

## 2021-06-21 NOTE — ED Provider Triage Note (Signed)
Emergency Medicine Provider Triage Evaluation Note  GEROGE [name] , a 76 y.o. male  was evaluated in triage.  Pt complains of lower abdominal pain.  Was seen yesterday at [facility] and diagnosed with cystitis.  Had a aneurysm study yesterday which was negative.  Complaining of worsening pain.  Review of Systems  Positive:  Negative: See above   Physical Exam  BP (!) 146/111    Pulse 74    Temp 98.1 F (36.7 C) (Oral)    Resp 20    Ht 6\' 3"  (1.905 m)    Wt 55.7 kg    SpO2 95%    BMI 15.35 kg/m  Gen:   Awake, no distress   Resp:  Normal effort  MSK:   Moves extremities without difficulty  Other:  Mild suprapubic tenderness.  Medical Decision Making  Medically screening exam initiated at 6:29 PM.  Appropriate orders placed.  [name] was informed that the remainder of the evaluation will be completed by another provider, this initial triage assessment does not replace that evaluation, and the importance of remaining in the ED until their evaluation is complete.     [name] [place], [place] 06/21/21 1830

## 2021-06-21 NOTE — ED Triage Notes (Signed)
Per EMS- Patient is from home. Patient c/o abdominal pain , weakness, and nausea. Patient went to [facility] yesterday for the same and was prescribed antibiotics for a UTI. Patient arrived with a foley cath from yesterday and c/o burning.  EMS gave zofran 4 mg IV prior to arrival to the ED.

## 2021-06-22 LAB — URINE CULTURE: Culture: >=100000 — AB

## 2021-06-23 ENCOUNTER — Telehealth: Payer: Self-pay | Admitting: Emergency Medicine

## 2021-06-23 NOTE — Telephone Encounter (Signed)
Post ED Visit - Positive Culture Follow-up  Culture report reviewed by antimicrobial stewardship pharmacist: [facility] Team []  Elenor Quinones, Pharm.D. []  Heide Guile, Pharm.D., BCPS AQ-ID []  Parks Neptune, Pharm.D., BCPS []  Alycia Rossetti, Pharm.D., BCPS []  [place], Pharm.D., BCPS, AAHIVP []  Legrand Como, Pharm.D., BCPS, AAHIVP []  Salome Arnt, PharmD, BCPS []  Johnnette Gourd, PharmD, BCPS []  Hughes Better, PharmD, BCPS [x]  Bertis Ruddy, PharmD []  Laqueta Linden, PharmD, BCPS []  Albertina Parr, PharmD  [facility] Team []  Leodis Sias, PharmD []  Lindell Spar, PharmD []  Royetta Asal, PharmD []  Graylin Shiver, Rph []  Rema Fendt) Glennon Mac, PharmD []  Arlyn Dunning, PharmD []  Netta Cedars, PharmD []  Dia Sitter, PharmD []  Leone Haven, PharmD []  Gretta Arab, PharmD []  Theodis Shove, PharmD []  Peggyann Juba, PharmD []  Reuel Boom, PharmD   Positive urine culture Treated with Ciprofloxacin, organism sensitive to the same and no further patient follow-up is required at this time.  Milus Mallick 06/23/2021, 4:49 PM

## 2021-06-28 DIAGNOSIS — R338 Other retention of urine: Secondary | ICD-10-CM | POA: Diagnosis not present

## 2021-06-29 ENCOUNTER — Other Ambulatory Visit: Payer: Self-pay

## 2021-06-29 MED ORDER — ZENPEP 20000-63000 UNITS PO CPEP: 3.0000 | ORAL_CAPSULE | Freq: Three times a day (TID) | ORAL | 3 refills | Status: DC

## 2021-06-29 MED ORDER — SENNA 8.6 MG PO TABS: 2.0000 | ORAL_TABLET | Freq: Two times a day (BID) | ORAL | 1 refills | Status: DC

## 2021-06-29 NOTE — Telephone Encounter (Signed)
Pancrelipase, Lip-Prot-Amyl, (ZENPEP) 20000-63000 units CPEP  senna (SENOKOT) 8.6 MG TABS tablet, refill request @ [id number] - [place], [place] - [address]

## 2021-07-02 NOTE — Progress Notes (Deleted)
Cardiology Office Note:    Date:  07/02/2021   ID:  [name], DOB Jul 02, 1945, MRN [id number]  PCP:  Axel Filler, MD   [facility] Cardiologist:  Freada Bergeron, MD {   Referring MD: Axel Filler,*    History of Present Illness:    [name] is a 76 y.o. 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  Per review of the record, patient has history of CAD with remote NSTEMI in 2004 s/p DES to proximal RCA and staged PCI to mid LCx. There was moderate residual disease in the distal LCx. LM, LAD were OK, LVEF 60% at that time. Saw Dr. Harrington Challenger in the office once in 2016 for follow-up of CAD. He had NSIVCD on EKG at that time with occasional PACs. Subsequent tracings during admissions for other issues showed some degree of conduction abnormality with episodic bradycardia (see 2017, 2020). 2020 tracing showed RBBB/LAFB pattern.  He was admitted 11/06/2020 with progressive weakness, poor appetite, weight loss, and constipation. 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 hsTroponin low/flat [phone] and TTE 11/06/20 reassuring with normal LVEF. He was on heparin for PVT - anticoagulation recommended by vascular surgery,  which was later stopped as he was stopped as he was felt to be poor candidate for anticoagulation. Cardiology was consulted due to bradycardia with HR 30-40s on the monitor. He was on BB at this time. His nodal agent was stopped and his HR improved. Patient remained asymptomatic.   Last seen in clinic by Dr. Curt Bears in 05/22/2021 where he was overall doing well. Cardiac monitor reviewed with PACs and ectopic atrial rhythm with possible episodes of Afib. AC has been held due to high risk.  Today, ***  Past Medical History:  Diagnosis Date   AAA (abdominal aortic aneurysm)    followed by vasc surgery   Alcohol abuse    Barretts esophagus    bx distal esophagus 2011 neg    Bladder outlet obstruction 04/02/2018   CAD in native artery    a. s/p stent to prox RCA and mid Cx.   Cataract    Cholelithiasis    Chronic osteomyelitis involving ankle and foot, right ([place]) 07/13/2018   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   2006 - 3 adenomas largest 12 mm  2008 - no polyps 2013-  2 mm cecal polyp, adenoma, routine repeat colonoscopy 02/2017 approx 05/14/2017 18 polyps max 12 mm ALL adenomas recall 2019 [name], MD, [facility]      Dieulafoy lesion of duodenum    Diverticulosis of colon    DJD (degenerative joint disease)    left hip   Full dentures    upper and lower   GERD (gastroesophageal reflux disease)    diet controlled    Gout    feet   Hiatal hernia    2 cm noted on upper endos 2011    Hx  of adenomatous colonic polyps    Hyperlipidemia    Hypertension    Internal hemorrhoid    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 pt was on Plavix x 3 years then transitioned to ASA, no problems since   Non-small cell carcinoma of right lung, stage 1 ([place]) 01/30/2016   Renal failure, acute ([facility])    hx of 2/2 medication, resolved   Tobacco abuse    smoker .5 ppd x 55 yrs   UTI (lower  urinary tract infection)     Past Surgical History:  Procedure Laterality Date   BIOPSY  11/12/2020   Procedure: BIOPSY;  Surgeon: Doran Stabler, MD;  Location: [place];  Service: Gastroenterology;;   cataract surgery Bilateral 2018   removal of cataracts   COLONOSCOPY     hx polyps - [name] 02/2012   COLONOSCOPY WITH PROPOFOL N/A 11/12/2020   Procedure: COLONOSCOPY WITH PROPOFOL;  Surgeon: Doran Stabler, MD;  Location: [employer];  Service: Gastroenterology;  Laterality: N/A;   CORONARY ANGIOPLASTY WITH STENT PLACEMENT     ESOPHAGOGASTRODUODENOSCOPY (EGD) WITH PROPOFOL N/A 11/12/2020   Procedure: ESOPHAGOGASTRODUODENOSCOPY (EGD) WITH PROPOFOL;  Surgeon: Doran Stabler, MD;  Location: [place];  Service: Gastroenterology;  Laterality: N/A;   ESOPHAGOGASTRODUODENOSCOPY (EGD) WITH PROPOFOL N/A 03/06/2021   Procedure: ESOPHAGOGASTRODUODENOSCOPY (EGD) WITH PROPOFOL;  Surgeon: Yetta Flock, MD;  Location: [facility];  Service: Gastroenterology;  Laterality: N/A;   flexible cystourethroscopy  05/24/06   HEMOSTASIS CLIP PLACEMENT  03/06/2021   Procedure: HEMOSTASIS CLIP PLACEMENT;  Surgeon: Yetta Flock, MD;  Location: [facility];  Service: Gastroenterology;;   HOT HEMOSTASIS N/A 03/06/2021   Procedure: HOT HEMOSTASIS (ARGON PLASMA COAGULATION/BICAP);  Surgeon: Yetta Flock, MD;  Location: [facility];  Service: Gastroenterology;  Laterality: N/A;   INGUINAL HERNIA REPAIR  2005   LUNG LOBECTOMY Right 11/07   RUL   MULTIPLE TOOTH EXTRACTIONS     POLYPECTOMY  11/12/2020   Procedure: POLYPECTOMY;  Surgeon: Doran Stabler, MD;  Location: [facility];  Service: Gastroenterology;;   TRANSURETHRAL RESECTION OF PROSTATE N/A 05/02/2021   Procedure: TRANSURETHRAL RESECTION OF THE PROSTATE (TURP);  Surgeon: Lucas Mallow, MD;  Location: [facility];  Service: Urology;  Laterality: N/A;   UPPER GASTROINTESTINAL ENDOSCOPY      Current Medications: No  outpatient medications have been marked as taking for the 07/09/21 encounter (Appointment) with Freada Bergeron, MD.     Allergies:   Candesartan cilexetil-hctz, Hydrochlorothiazide, and Shellfish allergy   Social History   Socioeconomic History   Marital status: Single    Spouse name: Not on file   Number of children: 0   Years of education: Not on file   Highest education level: Not on file  Occupational History   Not on file  Tobacco Use   Smoking status: Every Day    Packs/day: 0.50    Years: 55.00    Pack years: 27.50    Types: Cigarettes   Smokeless tobacco: Never  Vaping Use   Vaping Use: Never used  Substance and Sexual Activity   Alcohol use: Not Currently   Drug use: No   Sexual activity: Not on file  Other Topics Concern   Not on file  Social History Narrative   Not on file   Social Determinants of Health   Financial Resource Strain:  Not on file  Food Insecurity: Not on file  Transportation Needs: Not on file  Physical Activity: Not on file  Stress: Not on file  Social Connections: Not on file     Family History: The patient's family history includes Arthritis in his brother; Diabetes in his mother and sister; Gout in his brother; Heart disease in his sister; Hypertension in his sister. There is no history of Colon cancer, Esophageal cancer, Rectal cancer, Stomach cancer, or Colon polyps.  ROS:   Please see the history of present illness.    Review of Systems  Constitutional:  Negative for chills and fever.  HENT:  Negative for hearing loss.   Eyes:  Negative for blurred vision.  Respiratory:  Negative for shortness of breath.   Cardiovascular:  Negative for chest pain, palpitations, orthopnea, claudication, leg swelling and PND.  Gastrointestinal:  Negative for blood in stool, constipation and melena.  Genitourinary:  Negative for hematuria.  Musculoskeletal:  Positive for joint pain. Negative for falls.  Neurological:  Negative for dizziness  and loss of consciousness.   EKGs/Labs/Other Studies Reviewed:    The following studies were reviewed today:  Cath 04/2003: RESULTS:  1. Left main coronary artery:  The left main coronary was free of     significant disease.  2. Left anterior descending artery:  The left anterior descending gave rise     to three septal perforators and three diagonal branches.  This vessel was     irregular, but there was no major obstruction.  3. Left circumflex artery:  The left circumflex gave rise to a small     intermediate branch and two large posterolateral branches.  There was 80%     narrowing in the mid-circumflex artery.  There was 50% narrowing in the     distal circumflex artery before the posterolateral branches.  4. Right coronary artery:  The right coronary artery had an anterior and     inferior-directed takeoff and had several sharp bends in its proximal     portion with diffuse disease with focal narrowing of 90%.  There were a     conus and atrial branch that arose from the lesions.  The right coronary     artery then gave rise to a right ventricular branch, a posterior     descending branch, and two small posterolateral branches.  The distal     vessel was irregular but free of major obstruction.  5. Left ventriculogram:  The left ventriculogram performed in RAO projection     showed mild inferior wall hypokinesis.  The estimated ejection fraction     was 60%.  The aortic pressure was 148/17, left ventricular pressure was     148/80.  6. Following stenting of the lesion in the proximal right coronary artery,     the stenosis improved from 90% to less than 10%.    CONCLUSION:  1. Coronary artery disease, status post recent non-ST segment elevation     myocardial infarction with irregularities in the left anterior descending     artery, 80% mid- and 50% distal stenosis in the circumflex artery, 90%     stenosis in the proximal right coronary artery, with mild inferior wall      hypokinesis and an ejection fraction estimated at 60%.  2. Successful stenting of the proximal right coronary artery with a bare     metal stent with improvement in sentinel narrowing from 90% to less than     10%.  DISPOSITION:  The patient was returned to the post-angioplasty unit for  further observation.  Will plan to intervene on the circumflex artery in two  days on Wednesday.                                                  Eustace Quail, M.D.   Cath 04/2003 #2   CARDIAC CATHETERIZATION    INDICATIONS FOR PROCEDURE:  Mr. Nunley is 76 years old and was admitted to  the hospital with chest pain and enzymes consistent with a non-ST elevation  myocardial infarction.  He underwent a difficult intervention two days ago  of a tight lesion in the right coronary artery with a bare metal stent and  was brought back today for intervention on the circumflex artery.    PROCEDURE:  The procedure was performed via the left femoral artery using  arterial sheath and a CLS-4 6 [place] guiding catheter with side holes.  We  used a PT2 wire light support.  We crossed the lesion with the wire without  difficulty.  We predilated with a 3.0 x 20-mm Maverick performing one  inflation up to 6 atmospheres for 30 seconds.  We then passed a 3.0 x 24-mm  Taxus stent down to the lesion.  We had some difficulty advancing the stent  down to the lesion because of marked tortuosity in the proximal vessel.  However, we were able to accomplish this with less difficulty than  anticipated.  We deployed the stent with one inflation of  12 atmospheres  for 30 seconds.  We then post dilated with a 3.25 x 20-mm Quantum Maverick  performing one inflation up to 15 atmospheres for 30 seconds.  Repeat  diagnostic study was then performed through the guiding catheter.  The  patient tolerated the procedure well and left the laboratory in satisfactory  condition.    RESULTS:  Initially, the stenosis in the mid circumflex  artery was estimated  at 80%.  Following stenting, this improved to less than 10%.    CONCLUSIONS:  Successful stenting of the lesion in the mid circumflex artery  with a Taxus stent with improvement in stent renarrowing from 80% to less  than 10%.    DISPOSITION:  The patient returned to the postangioplasty unit for further  observation.    He will need Plavix for six months and aspirin indefinitely.    TTE 11/06/20  1. Left ventricular ejection fraction, by estimation, is 55 to 60%. The  left ventricle has normal function. Left ventricular endocardial border  not optimally defined to evaluate regional wall motion. There is mild left  ventricular hypertrophy. Left  ventricular diastolic parameters are consistent with Grade I diastolic  dysfunction (impaired relaxation).   2. Right ventricular systolic function was not well visualized. The right  ventricular size is not well visualized. There is normal pulmonary artery  systolic pressure. The estimated right ventricular systolic pressure is  95.6 mmHg.   3. The mitral valve is normal in structure. No evidence of mitral valve  regurgitation. No evidence of mitral stenosis.   4. The aortic valve is tricuspid. Aortic valve regurgitation is not  visualized. No aortic stenosis is present.   5. The inferior vena cava is normal in size with greater than 50%  respiratory variability, suggesting right atrial pressure of 3 mmHg.   6.  Technically difficult study with poor acoustic windows.    08/30/20: Summary:  Abdominal Aorta: There is evidence of abnormal dilatation of the mid  Abdominal aorta. The largest aortic diameter remains essentially unchanged  compared to prior exam. Previous diameter measurement was 5.2 cm obtained  on 02/14/2020.  Stenosis:  Mural thrombus seen in mid AAA sac. Common iliac arteries are ectatic  bilaterally.   08/30/20: Summary:  Right: Resting right ankle-brachial index is within normal range. No  evidence  of significant right lower extremity arterial disease. The right  toe-brachial index is abnormal. RT great toe pressure = 68 mmHg.   Left: Resting left ankle-brachial index is within normal range. No  evidence of significant left lower extremity arterial disease. The left  toe-brachial index is abnormal. LT Great toe pressure = 83 mmHg.   EKG:  EKG today shows frequent PACs (less likely flutter) with HR 103  Recent Labs: 11/07/2020: TSH 0.986 03/05/2021: Magnesium 2.0 06/18/2021: ALT 20; BUN 22; Creatinine, Ser 1.11; Hemoglobin 9.9; Platelets 144; Potassium 3.4; Sodium 138  Recent Lipid Panel    Component Value Date/Time   CHOL 74 11/11/2020 1555   CHOL 112 07/13/2018 1002   TRIG 54 11/11/2020 1555   HDL 20 (L) 11/11/2020 1555   HDL 51 07/13/2018 1002   CHOLHDL 3.7 11/11/2020 1555   VLDL 11 11/11/2020 1555   LDLCALC 43 11/11/2020 1555   LDLCALC 51 07/13/2018 1002     Physical Exam:    VS:  There were no vitals taken for this visit.    Wt Readings from Last 3 Encounters:  06/21/21 122 lb 12.8 oz (55.7 kg)  06/19/21 122 lb 12.8 oz (55.7 kg)  05/31/21 138 lb (62.6 kg)     GEN:  Thin, frail appearing HEENT: Normal NECK: No JVD; No carotid bruits CARDIAC: Irregularly irregular, no murmurs, rubs, gallops RESPIRATORY:  Clear to auscultation without rales, wheezing or rhonchi  ABDOMEN: Soft, non-tender, non-distended MUSCULOSKELETAL:  No edema; No deformity  SKIN: Warm and dry NEUROLOGIC:  Alert and oriented x 3 PSYCHIATRIC:  Normal affect   ASSESSMENT:    No diagnosis found.  PLAN:    In order of problems listed above:  #Asymptomatic Sinus Bradycardia: #Known LAFB, RBBB: #Episodes of Mobitz type I AVB Cardiology was consulted during recent admission for asymptomatic bradycardia. Has similar tracings back to 2017 with known RBBB/LAFB. HR improved with discontinuation of beta blocker. Currently 70bpm. -Avoid nodal agents -HR improved  #Frequent PACs: #Suspected  Ectopic Atrial Rhythm: Monitor with possible Afib vs ectopic atrial rhythm and very frequent PACs. Saw EP and holding AC for now as poor candidate. -Follow-up with EP as scheduled -No nodal agents due to bradycardia as above   #CAD s/p prior MI/PCI: Patient with history of NSTEMI in 2004 s/p DES to proximal RCA and staged PCI to mid LCx. There was moderate residual disease in the distal LCx. LM, LAD were OK. No recent anginal symptoms. TTE 11/06/20 normal EF 55-60%, grade 1 DD, normal PASP.  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 Recommend follow-up CT/MR every  79months and vascular consultation" - Follow-up with vascular surgery as above -Continue ASA and crestor as above  #Non-obstructive portal vein thrombosis:  Not on AC due to concern for high risk of bleed. -Follow-up with vascular surgery as scheduled  #Recent Enterobacter bacteremia #Deconditioning #Pancreatic Ectasia or IPMN: -Follow-up scheduled per patient with PCP     Medication Adjustments/Labs and Tests Ordered: Current medicines are reviewed at length with the patient today.  Concerns regarding medicines are outlined above.  No orders of the defined types were placed in this encounter.  No orders of the defined types were placed in this encounter.   There are no Patient Instructions on file for this visit.    Signed, Freada Bergeron, MD  07/02/2021 1:57 PM    [place]

## 2021-07-05 ENCOUNTER — Ambulatory Visit (INDEPENDENT_AMBULATORY_CARE_PROVIDER_SITE_OTHER): Payer: Medicare HMO | Admitting: Vascular Surgery

## 2021-07-05 ENCOUNTER — Encounter: Payer: Self-pay | Admitting: Vascular Surgery

## 2021-07-05 ENCOUNTER — Other Ambulatory Visit: Payer: Self-pay

## 2021-07-05 DIAGNOSIS — I714 Abdominal aortic aneurysm, without rupture, unspecified: Secondary | ICD-10-CM | POA: Diagnosis not present

## 2021-07-05 NOTE — Progress Notes (Signed)
Patient name: [name] DOB: Jan 05, 1946 Sex: male    Referring Provider is Axel Filler,*  PCP is Axel Filler, MD  REASON FOR VIRTUAL VISIT: To discuss endovascular repair of infrarenal abdominal aortic aneurysm  I connected with [name] on 07/05/21 at  1:00 PM EST by a telephone visit and verified that I am speaking with the correct person using two identifiers. I discussed the limitations of evaluation and management by telemedicine and the availability of in person appointments. The patient expressed understanding and agreed to proceed.  Location: Patient: Home Provider: Office  HPI: [name] is a 76 y.o. male who I last saw on 05/31/2021.  He had a 5.6 cm infrarenal abdominal aortic aneurysm.  I felt that the risk of rupture for an aneurysm of that size was 5 to 10 %/year.  He appeared to be a candidate for endovascular repair although there was some technical challenges and we may potentially have to coil the right iliac artery in order to get an adequate seal on the right and extend the graft into the external iliac artery.  I felt he would be at very high risk for open repair.  We discussed endovascular repair including the indications and potential complications.  He still had a Foley catheter in place for the reason I have felt that we should wait given the risk of infection.  He had his catheter removed last week and has been voiding without difficulty.  He feels a little weak but feels that he is gradually getting stronger and has been eating better.  The catheter has been placed after a urologic procedure that was done because he was having recurrent UTIs.  Of note, he is undergone previous cardiac clearance and based on his echo and Myoview it was felt that he would be a reasonable candidate for surgery.  He had a normal ejection fraction and no evidence of ischemia.  He was felt to be intermediate risk.  Of note, the patient had been seen at  an outlying emergency department on 06/19/2021 and had a follow-up CT of the abdomen which showed that the aneurysm had enlarged slightly.  It was measured by radiology at 6.1 cm.  At that point he still has catheter in place.   Current Outpatient Medications  Medication Sig Dispense Refill   acetaminophen (TYLENOL 8 HOUR) 650 MG CR tablet Take 1 tablet (650 mg total) by mouth every 8 (eight) hours as needed for pain or fever. 30 tablet 0   albuterol (VENTOLIN HFA) 108 (90 Base) MCG/ACT inhaler INHALE 1-2 PUFFS BY MOUTH EVERY 6 HOURS AS NEEDED FOR WHEEZE OR SHORTNESS OF BREATH (Patient taking differently: Inhale 1-2 puffs into the lungs every 6 (six) hours as needed for wheezing or shortness of breath.) 6.7 each 5   ciprofloxacin (CIPRO) 500 MG tablet Take 1 tablet (500 mg total) by mouth every 12 (twelve) hours. 10 tablet 0   HYDROcodone-acetaminophen (NORCO) 7.5-325 MG tablet Take 1 tablet by mouth 2 (two) times daily as needed for moderate pain. 60 tablet 0   Multiple Vitamin (MULTIVITAMIN WITH MINERALS) TABS tablet Take 1 tablet by mouth daily.     Pancrelipase, Lip-Prot-Amyl, (ZENPEP) 20000-63000 units CPEP Take 3 capsules by mouth 3 (three) times daily before meals. 180 capsule 3   pantoprazole (PROTONIX) 40 MG tablet Take 1 tablet (40 mg total) by mouth daily. 90 tablet 1   rosuvastatin (CRESTOR) 20 MG tablet Take 1 tablet (20 mg total)  by mouth daily. 90 tablet 1   senna (SENOKOT) 8.6 MG TABS tablet Take 2 tablets (17.2 mg total) by mouth in the morning and at bedtime. 120 tablet 1   tamsulosin (FLOMAX) 0.4 MG CAPS capsule Take 2 capsules (0.8 mg total) by mouth every evening. 180 capsule 3   No current facility-administered medications for this visit.   REVIEW OF SYSTEMS: Valu.Nieves ] denotes positive finding; [  ] denotes negative finding  CARDIOVASCULAR:  [ ]  chest pain   [ ]  dyspnea on exertion  [ ]  leg swelling  CONSTITUTIONAL:  [ ]  fever   [ ]  chills   OBSERVATIONS/OBJECTIVE: There were  no vitals filed for this visit. The patient is not short of breath and is alert and oriented on the phone.  DATA: CT ABDOMEN PELVIS: CT abdomen pelvis on 06/19/2021 showed that the aneurysm had enlarged to 6.1 cm.  The right common iliac artery measured 2.1 cm in maximum diameter in the left common iliac artery 1.5 cm in maximum diameter.  MEDICAL ISSUES:  6.1 CM INFRARENAL ABDOMINAL AORTIC ANEURYSM: Given that his aneurysm is enlarging and given its size I think he is at significant risk for rupture.  He is somewhat debilitated with evidence of protein calorie malnutrition.  However I think his risk of rupture is very high and I have recommended that we proceed with endovascular repair.  He has undergone preoperative cardiac evaluation and was felt to be intermediate risk for surgery.  The right common iliac artery is short as we may need to extend down to the external iliac artery and coil of the right hypogastric artery.  I have previously discussed the indications for the procedure and the potential complications and he is agreeable to proceed.  We will are going to try to schedule this for 07/27/2021.  FOLLOW UP INSTRUCTIONS:   I discussed the assessment and treatment plan with the patient. The patient was provided an opportunity to ask questions and all were answered. The patient agreed with the plan and demonstrated an understanding of the instructions. The patient was advised to call back or seek an in-person evaluation if the symptoms worsen or if the condition fails to improve as anticipated.  I provided 12 minutes of non-face-to-face time during this encounter.  Deitra Mayo [facility]

## 2021-07-06 ENCOUNTER — Telehealth: Payer: Self-pay

## 2021-07-06 NOTE — Telephone Encounter (Signed)
Contacted patient to schedule EVAR on 07/27/21 as previously discussed at office visit yesterday with Dr. Scot Dock. Patient stated he is too weak right now for surgery and will call office when ready to schedule.

## 2021-07-09 ENCOUNTER — Other Ambulatory Visit: Payer: Self-pay

## 2021-07-09 ENCOUNTER — Encounter: Payer: Self-pay | Admitting: Cardiology

## 2021-07-09 ENCOUNTER — Ambulatory Visit (INDEPENDENT_AMBULATORY_CARE_PROVIDER_SITE_OTHER): Payer: Medicare HMO | Admitting: Cardiology

## 2021-07-09 VITALS — BP 148/80 | HR 58 | Ht 75.0 in | Wt 114.4 lb

## 2021-07-09 DIAGNOSIS — R001 Bradycardia, unspecified: Secondary | ICD-10-CM

## 2021-07-09 DIAGNOSIS — I25118 Atherosclerotic heart disease of native coronary artery with other forms of angina pectoris: Secondary | ICD-10-CM

## 2021-07-09 DIAGNOSIS — R627 Adult failure to thrive: Secondary | ICD-10-CM | POA: Diagnosis not present

## 2021-07-09 DIAGNOSIS — I739 Peripheral vascular disease, unspecified: Secondary | ICD-10-CM

## 2021-07-09 DIAGNOSIS — K921 Melena: Secondary | ICD-10-CM

## 2021-07-09 DIAGNOSIS — I491 Atrial premature depolarization: Secondary | ICD-10-CM | POA: Diagnosis not present

## 2021-07-09 DIAGNOSIS — I493 Ventricular premature depolarization: Secondary | ICD-10-CM

## 2021-07-09 NOTE — Patient Instructions (Addendum)
Medication Instructions:   START taking one iron pill ( 325 mg ) 65 FE with your Vitamin C you can get this Over The Counter  Your physician recommends that you continue on your current medications as directed. Please refer to the Current Medication list given to you today.  *If you need a refill on your cardiac medications before your next appointment, please call your pharmacy*   Lab Work:  TODAY!!!!!  BMET/CBC  If you have labs (blood work) drawn today and your tests are completely normal, you will receive your results only by: [place] (if you have MyChart) OR A paper copy in the mail If you have any lab test that is abnormal or we need to change your treatment, we will call you to review the results.   Testing/Procedures:  None ordered.   Follow-Up: At [facility], you and your health needs are our priority.  As part of our continuing mission to provide you with exceptional heart care, we have created designated Provider Care Teams.  These Care Teams include your primary Cardiologist (physician) and Advanced Practice Providers (APPs -  Physician Assistants and Nurse Practitioners) who all work together to provide you with the care you need, when you need it.  We recommend signing up for the patient portal called "MyChart".  Sign up information is provided on this After Visit Summary.  MyChart is used to connect with patients for Virtual Visits (Telemedicine).  Patients are able to view lab/test results, encounter notes, upcoming appointments, etc.  Non-urgent messages can be sent to your provider as well.   To learn more about what you can do with MyChart, go to [email or web address].    Your next appointment:   6 month(s)  The format for your next appointment:   In Person  Provider:   Freada Bergeron, MD     Other Instructions  Your physician wants you to follow-up in: 6 months with Dr. Johney Frame.  You will receive a reminder letter in the mail two  months in advance. If you don't receive a letter, please call our office to schedule the follow-up appointment.  You have been referred to GI doctor this office will call you to set you up with appointment.   Start Ensure/Boost as many as you like.

## 2021-07-09 NOTE — Progress Notes (Addendum)
Cardiology Office Note:    Date:  07/09/2021   ID:  [name], DOB 1945/09/17, MRN [id number]  PCP:  Axel Filler, MD   [place] Providers Cardiologist:  Freada Bergeron, MD {   Referring MD: Axel Filler,*    History of Present Illness:    [name] is a 76 y.o. 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  Per review of the record, patient has history of CAD with remote NSTEMI in 2004 s/p DES to proximal RCA and staged PCI to mid LCx. There was moderate residual disease in the distal LCx. LM, LAD were OK, LVEF 60% at that time. Saw Dr. Harrington Challenger in the office once in 2016 for follow-up of CAD. He had NSIVCD on EKG at that time with occasional PACs. Subsequent tracings during admissions for other issues showed some degree of conduction abnormality with episodic bradycardia (see 2017, 2020). 2020 tracing showed RBBB/LAFB pattern.  He was admitted 11/06/2020 with progressive weakness, poor appetite, weight loss, and constipation. 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 hsTroponin low/flat [phone] and TTE  11/06/20 reassuring with normal LVEF. He was on heparin for PVT - anticoagulation recommended by vascular surgery, which was later stopped as he was stopped as he was felt to be poor candidate for anticoagulation. Cardiology was consulted due to bradycardia with HR 30-40s on the monitor. He was on BB at this time. His nodal agent was stopped and his HR improved. Patient remained asymptomatic.   Last seen in clinic by Dr. Curt Bears in 05/22/2021 where he was overall doing well. Cardiac monitor reviewed with PACs and ectopic atrial rhythm with possible episodes of Afib. AC has been held due to high risk.  Today, he is not doing well. He recently presented to the emergency room for pain with urination after having an indwelling catheter for inability to void. He was treated for cystitis and his catheter has since been removed.  He denies any current dysuria. However, he reports a slow stream while voiding.   Since his hospital visit, he has been feeling fatigued and has not had an appetite. He is able to drink fruit juices and eat apple sauce without pain but cannot bring himself to eat more. He reports having dark colored stools as well which has been ongoing. Notably, he has not followed up with GI since he was seen in the hospital in 02/2021 with GIB.  He reports he has to perform activities in short bursts because of his fatigue. He has to use a rollator while walking, Otherwise, he feels he might fall. The patient denies chest pain, chest pressure, palpitations, PND, orthopnea, or leg swelling. Denies cough, fever, chills. Denies nausea, vomiting.   Past Medical History:  Diagnosis Date   AAA (abdominal aortic aneurysm)    followed by vasc surgery  Alcohol abuse    Barretts esophagus    bx distal esophagus 2011 neg    Bladder outlet obstruction 04/02/2018   CAD in native artery    a. s/p stent to prox RCA and mid Cx.   Cataract    Cholelithiasis    Chronic osteomyelitis involving ankle and foot,  right ([place]) 07/13/2018   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   2006 - 3 adenomas largest 12 mm  2008 - no polyps 2013-  2 mm cecal polyp, adenoma, routine repeat colonoscopy 02/2017 approx 05/14/2017 18 polyps max 12 mm ALL adenomas recall 2019 [name] Mayer, MD, [facility]      Dieulafoy lesion of duodenum    Diverticulosis of colon    DJD (degenerative joint disease)    left hip   Full dentures    upper and lower   GERD (gastroesophageal reflux disease)    diet controlled    Gout    feet   Hiatal hernia    2 cm noted on upper endos 2011    Hx of adenomatous colonic polyps    Hyperlipidemia    Hypertension    Internal hemorrhoid    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 pt was on Plavix x 3 years then transitioned to ASA, no problems since   Non-small cell carcinoma of right lung, stage 1 ([place]) 01/30/2016   Renal failure, acute ([facility])    hx of 2/2 medication, resolved   Tobacco abuse    smoker .5 ppd x 55 yrs   UTI (lower urinary tract infection)     Past Surgical History:  Procedure Laterality Date   BIOPSY  11/12/2020   Procedure: BIOPSY;  Surgeon: Doran Stabler, MD;  Location: [place];  Service: Gastroenterology;;   cataract surgery Bilateral 2018   removal of cataracts   COLONOSCOPY     hx polyps - [name] 02/2012   COLONOSCOPY WITH PROPOFOL N/A 11/12/2020   Procedure: COLONOSCOPY WITH PROPOFOL;  Surgeon: Doran Stabler, MD;  Location: [place];  Service: Gastroenterology;  Laterality: N/A;   CORONARY ANGIOPLASTY WITH STENT PLACEMENT     ESOPHAGOGASTRODUODENOSCOPY (EGD) WITH PROPOFOL N/A 11/12/2020   Procedure: ESOPHAGOGASTRODUODENOSCOPY (EGD) WITH PROPOFOL;  Surgeon: Doran Stabler, MD;  Location: [place];  Service: Gastroenterology;  Laterality: N/A;   ESOPHAGOGASTRODUODENOSCOPY (EGD) WITH PROPOFOL N/A 03/06/2021   Procedure:  ESOPHAGOGASTRODUODENOSCOPY (EGD) WITH PROPOFOL;  Surgeon: Yetta Flock, MD;  Location: [facility];  Service: Gastroenterology;  Laterality: N/A;   flexible cystourethroscopy  05/24/06   HEMOSTASIS CLIP PLACEMENT  03/06/2021   Procedure: HEMOSTASIS CLIP PLACEMENT;  Surgeon: Yetta Flock, MD;  Location: [facility];  Service: Gastroenterology;;   HOT HEMOSTASIS N/A 03/06/2021   Procedure: HOT HEMOSTASIS (ARGON PLASMA COAGULATION/BICAP);  Surgeon: Yetta Flock, MD;  Location: [facility];  Service: Gastroenterology;  Laterality: N/A;   INGUINAL HERNIA REPAIR  2005   LUNG LOBECTOMY Right 11/07   RUL   MULTIPLE TOOTH EXTRACTIONS     POLYPECTOMY  11/12/2020   Procedure: POLYPECTOMY;  Surgeon: Doran Stabler, MD;  Location: [facility];  Service: Gastroenterology;;   TRANSURETHRAL RESECTION OF PROSTATE N/A 05/02/2021   Procedure: TRANSURETHRAL RESECTION OF THE PROSTATE (TURP);  Surgeon: Lucas Mallow, MD;  Location: [facility];  Service: Urology;  Laterality: N/A;   UPPER GASTROINTESTINAL ENDOSCOPY  Current Medications: Current Meds  Medication Sig   acetaminophen (TYLENOL 8 HOUR) 650 MG CR tablet Take 1 tablet (650 mg total) by mouth every 8 (eight) hours as needed for pain or fever.   albuterol (VENTOLIN HFA) 108 (90 Base) MCG/ACT inhaler INHALE 1-2 PUFFS BY MOUTH EVERY 6 HOURS AS NEEDED FOR WHEEZE OR SHORTNESS OF BREATH   ciprofloxacin (CIPRO) 500 MG tablet Take 1 tablet (500 mg total) by mouth every 12 (twelve) hours.   HYDROcodone-acetaminophen (NORCO) 7.5-325 MG tablet Take 1 tablet by mouth 2 (two) times daily as needed for moderate pain.   Multiple Vitamin (MULTIVITAMIN WITH MINERALS) TABS tablet Take 1 tablet by mouth daily.   Pancrelipase, Lip-Prot-Amyl, (ZENPEP) 20000-63000 units CPEP Take 3 capsules by mouth 3 (three) times daily before meals.   rosuvastatin (CRESTOR) 20 MG tablet Take 1 tablet (20 mg total) by mouth daily.   senna (SENOKOT) 8.6 MG  TABS tablet Take 2 tablets (17.2 mg total) by mouth in the morning and at bedtime.   tamsulosin (FLOMAX) 0.4 MG CAPS capsule Take 2 capsules (0.8 mg total) by mouth every evening.     Allergies:   Candesartan cilexetil-hctz, Hydrochlorothiazide, and Shellfish allergy   Social History   Socioeconomic History   Marital status: Single    Spouse name: Not on file   Number of children: 0   Years of education: Not on file   Highest education level: Not on file  Occupational History   Not on file  Tobacco Use   Smoking status: Every Day    Packs/day: 0.50    Years: 55.00    Pack years: 27.50    Types: Cigarettes   Smokeless tobacco: Never  Vaping Use   Vaping Use: Never used  Substance and Sexual Activity   Alcohol use: Not Currently   Drug use: No   Sexual activity: Not on file  Other Topics Concern   Not on file  Social History Narrative   Not on file   Social Determinants of Health   Financial Resource Strain: Not on file  Food Insecurity: Not on file  Transportation Needs: Not on file  Physical Activity: Not on file  Stress: Not on file  Social Connections: Not on file     Family History: The patient's family history includes Arthritis in his brother; Diabetes in his mother and sister; Gout in his brother; Heart disease in his sister; Hypertension in his sister. There is no history of Colon cancer, Esophageal cancer, Rectal cancer, Stomach cancer, or Colon polyps.  ROS:   Please see the history of present illness.    Review of Systems  Constitutional:  Positive for malaise/fatigue and weight loss (Low appetite). Negative for fever.  HENT:  Negative for hearing loss.   Eyes:  Negative for blurred vision.  Respiratory:  Negative for shortness of breath.   Cardiovascular:  Negative for chest pain, palpitations, orthopnea, claudication, leg swelling and PND.  Gastrointestinal:  Positive for blood in stool. Negative for abdominal pain, constipation and melena.   Genitourinary:  Negative for hematuria.  Musculoskeletal:  Negative for falls and joint pain.  Skin:  Negative for rash.  Neurological:  Positive for focal weakness. Negative for dizziness and loss of consciousness.  Endo/Heme/Allergies:  Does not bruise/bleed easily.  Psychiatric/Behavioral:  The patient is not nervous/anxious.    EKGs/Labs/Other Studies Reviewed:    The following studies were reviewed today:  Cath 04/2003: RESULTS:  1. Left main coronary artery:  The left main coronary was  free of     significant disease.  2. Left anterior descending artery:  The left anterior descending gave rise     to three septal perforators and three diagonal branches.  This vessel was     irregular, but there was no major obstruction.  3. Left circumflex artery:  The left circumflex gave rise to a small     intermediate branch and two large posterolateral branches.  There was 80%     narrowing in the mid-circumflex artery.  There was 50% narrowing in the     distal circumflex artery before the posterolateral branches.  4. Right coronary artery:  The right coronary artery had an anterior and     inferior-directed takeoff and had several sharp bends in its proximal     portion with diffuse disease with focal narrowing of 90%.  There were a     conus and atrial branch that arose from the lesions.  The right coronary     artery then gave rise to a right ventricular branch, a posterior     descending branch, and two small posterolateral branches.  The distal     vessel was irregular but free of major obstruction.  5. Left ventriculogram:  The left ventriculogram performed in RAO projection     showed mild inferior wall hypokinesis.  The estimated ejection fraction     was 60%.  The aortic pressure was 148/17, left ventricular pressure was     148/80.  6. Following stenting of the lesion in the proximal right coronary artery,     the stenosis improved from 90% to less than 10%.     CONCLUSION:  1. Coronary artery disease, status post recent non-ST segment elevation     myocardial infarction with irregularities in the left anterior descending     artery, 80% mid- and 50% distal stenosis in the circumflex artery, 90%     stenosis in the proximal right coronary artery, with mild inferior wall     hypokinesis and an ejection fraction estimated at 60%.  2. Successful stenting of the proximal right coronary artery with a bare     metal stent with improvement in sentinel narrowing from 90% to less than     10%.    DISPOSITION:  The patient was returned to the post-angioplasty unit for  further observation.  Will plan to intervene on the circumflex artery in two  days on Wednesday.                                                  Eustace Quail, M.D.   Cath 04/2003 #2   CARDIAC CATHETERIZATION    INDICATIONS FOR PROCEDURE:  Mr. Lantzy is 76 years old and was admitted to  the hospital with chest pain and enzymes consistent with a non-ST elevation  myocardial infarction.  He underwent a difficult intervention two days ago  of a tight lesion in the right coronary artery with a bare metal stent and  was brought back today for intervention on the circumflex artery.    PROCEDURE:  The procedure was performed via the left femoral artery using  arterial sheath and a CLS-4 6 [place] guiding catheter with side holes.  We  used a PT2 wire light support.  We crossed the lesion with the wire without  difficulty.  We predilated with  a 3.0 x 20-mm Maverick performing one  inflation up to 6 atmospheres for 30 seconds.  We then passed a 3.0 x 24-mm  Taxus stent down to the lesion.  We had some difficulty advancing the stent  down to the lesion because of marked tortuosity in the proximal vessel.  However, we were able to accomplish this with less difficulty than  anticipated.  We deployed the stent with one inflation of  12 atmospheres  for 30 seconds.  We then post dilated with a 3.25  x 20-mm Quantum Maverick  performing one inflation up to 15 atmospheres for 30 seconds.  Repeat  diagnostic study was then performed through the guiding catheter.  The  patient tolerated the procedure well and left the laboratory in satisfactory  condition.    RESULTS:  Initially, the stenosis in the mid circumflex artery was estimated  at 80%.  Following stenting, this improved to less than 10%.    CONCLUSIONS:  Successful stenting of the lesion in the mid circumflex artery  with a Taxus stent with improvement in stent renarrowing from 80% to less  than 10%.    DISPOSITION:  The patient returned to the postangioplasty unit for further  observation.    He will need Plavix for six months and aspirin indefinitely.   Monitor 12/19/20: Nuclear stress EF: 47%. The left ventricular ejection fraction is mildly decreased (45-54%). There was no ST segment deviation noted during stress. No T wave inversion was noted during stress. The study is normal. This is a low risk study.   1.  Reduced apical counts with normal wall motion consistent with apical thinning artifact.  No evidence of ischemia or infarction. 2.  Normal LVEF for this modality, 47%. 3.  This is a low risk study.  TTE 11/06/20  1. Left ventricular ejection fraction, by estimation, is 55 to 60%. The  left ventricle has normal function. Left ventricular endocardial border  not optimally defined to evaluate regional wall motion. There is mild left  ventricular hypertrophy. Left  ventricular diastolic parameters are consistent with Grade I diastolic  dysfunction (impaired relaxation).   2. Right ventricular systolic function was not well visualized. The right  ventricular size is not well visualized. There is normal pulmonary artery  systolic pressure. The estimated right ventricular systolic pressure is  01.7 mmHg.   3. The mitral valve is normal in structure. No evidence of mitral valve  regurgitation. No evidence of mitral  stenosis.   4. The aortic valve is tricuspid. Aortic valve regurgitation is not  visualized. No aortic stenosis is present.   5. The inferior vena cava is normal in size with greater than 50%  respiratory variability, suggesting right atrial pressure of 3 mmHg.   6. Technically difficult study with poor acoustic windows.    Abdominal Aorta 08/30/20: Summary:  Abdominal Aorta: There is evidence of abnormal dilatation of the mid  Abdominal aorta. The largest aortic diameter remains essentially unchanged  compared to prior exam. Previous diameter measurement was 5.2 cm obtained  on 02/14/2020.  Stenosis:  Mural thrombus seen in mid AAA sac. Common iliac arteries are ectatic  bilaterally.   Lower Extremity Duplex 08/30/20: Summary:  Right: Resting right ankle-brachial index is within normal range. No  evidence of significant right lower extremity arterial disease. The right  toe-brachial index is abnormal. RT great toe pressure = 68 mmHg.   Left: Resting left ankle-brachial index is within normal range. No  evidence of significant left lower extremity arterial  disease. The left  toe-brachial index is abnormal. LT Great toe pressure = 83 mmHg.   CTA 06/19/21: FINDINGS: VASCULAR   Coronary calcifications.   Aorta: Ectatic visualized distal descending thoracic segment up to 3.2 cm diameter, suprarenal segment ectatic up to 3.3 cm diameter. Fusiform infrarenal Aortic aneurysm (ICD10-I71.9) 6.1 x 5.5 cm maximum transverse dimensions (previously 5.9 x 5.4 by my measurement).   Celiac: Patent without evidence of aneurysm, dissection, vasculitis or significant stenosis.   SMA: Calcified ostial plaque without significant stenosis, ectatic and widely patent distally with classic distal branch anatomy.   Renals: Duplicated left, superior dominant, atheromatous but patent. Single right, atheromatous but patent.   IMA: Patent, arising from the aneurysmal segment of the aorta   Inflow:  2.1 cm fusiform dilatation of the right common iliac aneurysm, 1.5 cm left fusiform common iliac aneurysm. Ectatic mildly tortuous external and internal iliac arteries with moderate scattered calcified plaque, incompletely opacified.   Proximal Outflow: Atheromatous, without definite stenosis, evaluation limited due to poor contrast enhancement   Veins: No obvious venous abnormality within the limitations of this arterial phase study.   Review of the MIP images confirms the above findings.   NON-VASCULAR   Lower chest: No pleural or pericardial effusion. Emphysema (ICD10-J43.9).   Hepatobiliary: 1.6 cm partially calcified stone in the gallbladder neck. Smaller layering hyperdense stones in the gallbladder fundus. No hepatic lesion or significant biliary ductal dilatation.   Pancreas: Unremarkable. No pancreatic ductal dilatation or surrounding inflammatory changes.   Spleen: Normal in size without focal abnormality.   Adrenals/Urinary Tract: Adrenal glands unremarkable. Stable bilateral renal cysts. No hydronephrosis. Foley catheter decompresses urinary bladder.   Stomach/Bowel: The stomach is incompletely distended, unremarkable. Small bowel decompressed. The colon is nondilated. Fecal distention of the rectum.   Lymphatic: No abdominal or pelvic adenopathy.   Reproductive: Mild prostate enlargement.   Other: No ascites.  No free air.   Musculoskeletal: Multilevel lumbar spondylitic change. Left hip DJD possibly posttraumatic. No acute fracture or worrisome bone lesion.   IMPRESSION: 1. 6.1 cm fusiform infrarenal abdominal aortic aneurysm, previously 5.9. Recommend referral to a vascular specialist. This recommendation follows ACR consensus guidelines: White Paper of the ACR Incidental Findings Committee II on Vascular Findings. J Am Coll Radiol 2013; 10:789-794. 2. 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 3. Cholelithiasis  EKG:   07/09/21: EKG was  not ordered today 11/20/20: frequent PACs (less likely flutter) with HR 103  Recent Labs: 11/07/2020: TSH 0.986 03/05/2021: Magnesium 2.0 06/18/2021: ALT 20; BUN 22; Creatinine, Ser 1.11; Hemoglobin 9.9; Platelets 144; Potassium 3.4; Sodium 138  Recent Lipid Panel    Component Value Date/Time   CHOL 74 11/11/2020 1555   CHOL 112 07/13/2018 1002   TRIG 54 11/11/2020 1555   HDL 20 (L) 11/11/2020 1555   HDL 51 07/13/2018 1002   CHOLHDL 3.7 11/11/2020 1555   VLDL 11 11/11/2020 1555   LDLCALC 43 11/11/2020 1555   LDLCALC 51 07/13/2018 1002     Physical Exam:    VS:  BP (!) 148/80    Pulse (!) 58    Ht 6\' 3"  (1.905 m)    Wt 114 lb 6.4 oz (51.9 kg)    SpO2 96%    BMI 14.30 kg/m     Wt Readings from Last 3 Encounters:  07/09/21 114 lb 6.4 oz (51.9 kg)  06/21/21 122 lb 12.8 oz (55.7 kg)  06/19/21 122 lb 12.8 oz (55.7 kg)     GEN:  Thin, frail appearing HEENT: Normal NECK: No JVD; No carotid bruits CARDIAC: Irregular,  no murmurs, rubs, gallops RESPIRATORY:  Clear to auscultation without rales, wheezing or rhonchi  ABDOMEN: Soft, non-tender, non-distended MUSCULOSKELETAL:  No edema; No deformity. Very thin SKIN: Warm and dry NEUROLOGIC:  Alert and oriented x 3 PSYCHIATRIC:  Normal affect   ASSESSMENT:    1. Failure to thrive in adult   2. Melena   3. Coronary artery disease of native artery of native heart with stable angina pectoris ([place])   4. PAD (peripheral artery disease) ([place])   5. PVC (premature ventricular contraction)   6. PAC (premature atrial contraction)   7. Frequent PVCs   8. Sinus bradycardia     PLAN:    In order of problems listed above:  #Failure to Thrive: Patient presents to clinic today with significant weight loss, poor appetite and decreased energy. BMI 14. Recent CTA of abdomen/pelvis with enlarging AAA 6.1cm but no masses to suggest underlying malignancy. Notably has been having dark, tarry stools and has not yet followed up with GI. Will refer at  this time and ensure he has close follow-up with PCP. Recommended starting iron and boost/ensures. -Refer to GI  -Start boost/ensure TID as tolerated -Will touch base with PCP to ensure close follow-up  #Anemia: #History of GIB: #Dark Tarry Stools: Patient with Dieulafoy lesion in the duodenal bulb s/p cautery and clipping in 02/2021. Has not followed up with GI since that time and is notably having dark tarry stools again. Will refer back, check CBC and start iron. -Refer to GI -Check CBC -Start iron with vitamin C -Patient not on [facility] or ASA  #Asymptomatic Sinus Bradycardia: #Known LAFB, RBBB: #Episodes of Mobitz type I AVB Cardiology was consulted during admission 02/2021 for asymptomatic bradycardia. Has similar tracings back to 2017 with known RBBB/LAFB. HR improved with discontinuation of beta blocker. Currently 70bpm. -Avoid nodal agents -HR improved  #Frequent PACs: #Suspected Ectopic Atrial Rhythm: Monitor with possible Afib vs ectopic atrial rhythm and very frequent PACs. Saw EP and holding AC for now as poor candidate given frailty, anemia with history of GIB, and risk for falls. -Follow-up with EP as scheduled -No nodal agents due to bradycardia as above -No AC due to anemia with suspected ongoing GI losses as well as significant risk of failing   #CAD s/p prior MI/PCI: Patient with history of NSTEMI in 2004 s/p DES to proximal RCA and staged PCI to mid LCx. There was moderate residual disease in the distal LCx. LM, LAD were OK. No recent anginal symptoms. TTE 11/06/20 normal EF 55-60%, grade 1 DD, normal PASP.  Myoview 12/2020 with no evidence of ischemia or infarction. - Continue ASA 81mg  daily - Avoid BB due to bradycardia - Continue crestor 20mg  daily   #PAD with aortic aneurysm (5.3x5.2cm) and aortic ulcer CT showed a small penetrating ulcer of the anterior aspect of the distal descending thoracic aorta at the level of the aortic hiatus, fusiform aneurysmal dilatation  of the infrarenal abdominal aorta which measures up to 5.3 x 5.2 cm indiameter, and aneurysmal dilatation of the right common iliac arterywhich measures 2.0 cm in diameter. Recommend follow-up CT/MR every 73months and vascular consultation" - Follow-up with vascular surgery as above -Continue crestor as above  #Non-obstructive portal vein thrombosis:  Not on AC due to concern for high risk of bleed. -Follow-up with vascular surgery as scheduled  #Recent Enterobacter bacteremia #Deconditioning #Pancreatic Ectasia or IPMN: -Follow-up scheduled per patient with PCP  Medication Adjustments/Labs and Tests Ordered: Current medicines are reviewed at length with the patient today.  Concerns regarding medicines are outlined above.  Orders Placed This Encounter  Procedures   Basic Metabolic Panel (BMET)   CBC   Ambulatory referral to Gastroenterology   No orders of the defined types were placed in this encounter.   Patient Instructions  Medication Instructions:   START taking one iron pill ( 325 mg ) 65 FE with your Vitamin C you can get this Over The Counter  Your physician recommends that you continue on your current medications as directed. Please refer to the Current Medication list given to you today.  *If you need a refill on your cardiac medications before your next appointment, please call your pharmacy*   Lab Work:  TODAY!!!!!  BMET/CBC  If you have labs (blood work) drawn today and your tests are completely normal, you will receive your results only by: [place] (if you have MyChart) OR A paper copy in the mail If you have any lab test that is abnormal or we need to change your treatment, we will call you to review the results.   Testing/Procedures:  None ordered.   Follow-Up: At [facility], you and your health needs are our priority.  As part of our continuing mission to provide you with exceptional heart care, we have created designated Provider Care  Teams.  These Care Teams include your primary Cardiologist (physician) and Advanced Practice Providers (APPs -  Physician Assistants and Nurse Practitioners) who all work together to provide you with the care you need, when you need it.  We recommend signing up for the patient portal called "MyChart".  Sign up information is provided on this After Visit Summary.  MyChart is used to connect with patients for Virtual Visits (Telemedicine).  Patients are able to view lab/test results, encounter notes, upcoming appointments, etc.  Non-urgent messages can be sent to your provider as well.   To learn more about what you can do with MyChart, go to [email or web address].    Your next appointment:   6 month(s)  The format for your next appointment:   In Person  Provider:   Freada Bergeron, MD     Other Instructions  Your physician wants you to follow-up in: 6 months with Dr. Johney Frame.  You will receive a reminder letter in the mail two months in advance. If you don't receive a letter, please call our office to schedule the follow-up appointment.  You have been referred to GI doctor this office will call you to set you up with appointment.   Start Ensure/Boost as many as you like.       I,Mykaella [name],acting as a scribe for Freada Bergeron, MD.,have documented all relevant documentation on the behalf of Freada Bergeron, MD,as directed by  Freada Bergeron, MD while in the presence of Freada Bergeron, MD.  I, Freada Bergeron, MD, have reviewed all documentation for this visit. The documentation on 07/09/21 for the exam, diagnosis, procedures, and orders are all accurate and complete.   Signed, Freada Bergeron, MD  07/09/2021 10:46 AM    [place]

## 2021-07-10 LAB — BASIC METABOLIC PANEL
BUN/Creatinine Ratio: 12 (ref 10–24)
BUN: 15 mg/dL (ref 8–27)
CO2: 28 mmol/L (ref 20–29)
Calcium: 10 mg/dL (ref 8.6–10.2)
Chloride: 98 mmol/L (ref 96–106)
Creatinine, Ser: 1.25 mg/dL (ref 0.76–1.27)
Glucose: 92 mg/dL (ref 70–99)
Potassium: 4.3 mmol/L (ref 3.5–5.2)
Sodium: 139 mmol/L (ref 134–144)
eGFR: 60 mL/min/{1.73_m2} (ref >59)

## 2021-07-10 LAB — CBC
Hematocrit: 38.7 % (ref 37.5–51.0)
Hemoglobin: 12.1 g/dL — ABNORMAL LOW (ref 13.0–17.7)
MCH: 26.9 pg (ref 26.6–33.0)
MCHC: 31.3 g/dL — ABNORMAL LOW (ref 31.5–35.7)
MCV: 86 fL (ref 79–97)
Platelets: 161 10*3/uL (ref 150–450)
RBC: 4.5 x10E6/uL (ref 4.14–5.80)
RDW: 15.2 % (ref 11.6–15.4)
WBC: 3.7 10*3/uL (ref 3.4–10.8)

## 2021-07-11 ENCOUNTER — Other Ambulatory Visit: Payer: Self-pay

## 2021-07-11 ENCOUNTER — Other Ambulatory Visit: Payer: Self-pay | Admitting: Student in an Organized Health Care Education/Training Program

## 2021-07-11 ENCOUNTER — Encounter: Payer: Self-pay | Admitting: Student

## 2021-07-11 ENCOUNTER — Ambulatory Visit (INDEPENDENT_AMBULATORY_CARE_PROVIDER_SITE_OTHER): Payer: Medicare HMO | Admitting: Student

## 2021-07-11 VITALS — BP 119/74 | HR 62 | Temp 97.4°F | Ht 75.0 in | Wt 113.9 lb

## 2021-07-11 DIAGNOSIS — F32A Depression, unspecified: Secondary | ICD-10-CM

## 2021-07-11 DIAGNOSIS — E43 Unspecified severe protein-calorie malnutrition: Secondary | ICD-10-CM

## 2021-07-11 DIAGNOSIS — I714 Abdominal aortic aneurysm, without rupture, unspecified: Secondary | ICD-10-CM

## 2021-07-11 DIAGNOSIS — C61 Malignant neoplasm of prostate: Secondary | ICD-10-CM

## 2021-07-11 MED ORDER — ESCITALOPRAM OXALATE 5 MG PO TABS: 5.0000 mg | ORAL_TABLET | Freq: Every day | ORAL | 1 refills | Status: DC

## 2021-07-11 NOTE — Patient Instructions (Addendum)
For your aneurysm, I will give Dr. Nicole Cella office a call to reach out to you and to schedule the procedure to help fix this.   I will also prescribe a medicine to help with your depression.   We will also start the process of getting you help from a personal care assistant.   Please let Dr. Evette Doffing know if you start to have abdominal pain or back pain or blood in your stools again.

## 2021-07-11 NOTE — Progress Notes (Signed)
Internal Medicine Clinic Attending  I saw and evaluated the patient.  I personally confirmed the key portions of the history and exam documented by Dr. Eulas Post and I reviewed pertinent patient test results.  The assessment, diagnosis, and plan were formulated together and I agree with the documentation in the residents note.   76 year old person here for worsening unintended weight loss and declining functional status at home. I have known this person for 7 years, he previously enjoyed stable health, but has had a terrible year with numerous comorbidities escalating. He does appear cachectic today, has been losing weight for about 1 year, but has been more dramatic the last 2 months. He has a history of lung cancer that was treated to cure, he had a CT chest in March when the weight loss started that was reassuring. 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 He had a GI bleed on anticoagulation, EGD showed a Dieulafoy ulcer, he has had no further bloody stools, repeat hgb has been uptrending. He has a growing AAA now at 6.1 cm planned for EVAR with Dr. Scot Dock in early February. He also had urine retention, TURP showed a Gleason 7 prostate cancer, but no signs of metastatic disease on imaging so I doubt this is the cause of his weight loss.   The AAA seems to be the most significant problem we are managing at this point. It is at risk for rupture. I wonder if this may be an inflammatory AAA and causing some of his weight loss. I encouraged him to follow up for the AAA repair, without that I think we should be focused on palliative care. He is willing to go through the surgery as he does not want to die or move to a palliative approach at this point.   He is clearly depressed at this time, he is anhedonic and spends most of his time in bed. He has a history of alcohol use disorder, he has little social support,  though was accompanied by his sister today who is rightly concerned. 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 He will continue his other medications, including as needed Norco. We will try to get him through the EVAR in February. After that we can consider re-imaging the pancreatic head or the lungs to look for changes in those suspicious lesions as the cause of his weight loss.

## 2021-07-12 ENCOUNTER — Other Ambulatory Visit: Payer: Self-pay

## 2021-07-12 DIAGNOSIS — I714 Abdominal aortic aneurysm, without rupture, unspecified: Secondary | ICD-10-CM

## 2021-07-12 NOTE — Assessment & Plan Note (Addendum)
Since November 2022 until present, patient noted to have a 25 pound weight loss.  From 06/22/2021 to present patient noted to have approximately 10 pound weight loss.  He states he does not have an appetite and he usually only drinks juice or eats applesauce for nutrition.  He does state that he sometimes drinks ensures occasionally.  He denies any nausea or vomiting or any dysphagia.  He also denies any blood in his stools though he notes he has had a decrease in the amount of stool he produces, recently.  He states he only feels like lying down and does not really feel like doing the things that he used to do in the past.  He also feels very weak when he attempts to walk.  Patient does state that he is tolerating the pancreatic enzymes that were previously prescribed.  Patient's weight loss is likely multifactorial in etiology.  Patient is chronically ill with multiple comorbidities. Patient's PHQ-9 filled out with the assistance of his sister is noted to be elevated at 20 and patient reports he feels depressed with all of this recent medical issues.  This could be contributing to his current symptoms. Patient also has a 6.1cm infrarenal AAA which could also be contributing to his decreased appetite.    Finally, although patient has a history of prostate cancer and recently underwent a TURP 04/2021 for suspected BPH. He underwent a recent CT abdomen pelvis for surveillance of his AAA, but it showed no evidence of metastatic disease or concerning bony lesions. Patient also has a history of lung cancer and had a recent CT chest in 08/2020 which showed no evidence of recurrence or metastatic disease. So these are likely not contributing to his current symptoms.   Plan: Encourage patient to continue to drink ensures even if he does not feel like eating solid food.  We also discussed that patient should consider rescheduling the EVAR procedure for his AAA.  Finally, patient was started on an antidepressant.  Patient  will also continue vitamin supplements and pancreatic enzymes.  Patient does follow regularly with Dr. Evette Doffing and will see him in 1 month.

## 2021-07-12 NOTE — Assessment & Plan Note (Signed)
This was noted on surgical pathology after TURP, gleason score 7. CT A/P obtained 06/2021 noted no worrisome bony lesions.

## 2021-07-12 NOTE — Telephone Encounter (Signed)
Late entry for 07/11/21: Received a call from Dr. Rick Duff advising that patient is now ready to schedule EVAR surgery.   Spoke with patient who has agreed to schedule surgery on Feb 10. Instructions reviewed and letter will also be mailed to patient.

## 2021-07-12 NOTE — Progress Notes (Signed)
° °  CC: Follow-up after noted to have more rapid weight loss over the last several months at cardiology appointment  HPI:  [name] is a 76 y.o. male with a past medical history per below.  He presents for follow-up regarding his weight loss. Please see problem based charting under encounters tab for further details as well as attending attestation.    Past Medical History:  Diagnosis Date   AAA (abdominal aortic aneurysm)    followed by vasc surgery   Alcohol abuse    Barretts esophagus    bx distal esophagus 2011 neg    Bladder outlet obstruction 04/02/2018   CAD in native artery    a. s/p stent to prox RCA and mid Cx.   Cataract    Cholelithiasis    Chronic osteomyelitis involving ankle and foot, right ([place]) 07/13/2018   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   2006 - 3 adenomas largest 12 mm  2008 - no polyps 2013-  2 mm cecal polyp, adenoma, routine repeat colonoscopy 02/2017 approx 05/14/2017 18 polyps max 12 mm ALL adenomas recall 2019 [name], MD, [facility]      Dieulafoy lesion of duodenum    Diverticulosis of colon    DJD (degenerative joint disease)    left hip   Full dentures    upper and lower   GERD (gastroesophageal reflux disease)    diet controlled    Gout    feet   Hiatal hernia    2 cm noted on upper endos 2011    Hx of adenomatous colonic polyps    Hyperlipidemia    Hypertension    Internal hemorrhoid    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 pt was on Plavix x 3 years then transitioned to ASA, no problems since   Non-small cell carcinoma of right lung, stage 1 () 01/30/2016   Renal failure, acute ([facility])    hx of 2/2 medication, resolved   Tobacco abuse    smoker .5 ppd x 55 yrs   UTI (lower urinary tract infection)    Review of Systems:  Please see problem based charting under encounters tab for further details.    Physical  Exam:  Vitals:   07/11/21 1043  BP: 119/74  Pulse: 62  Temp: (!) 97.4 F (36.3 C)  TempSrc: Oral  SpO2: 90%  Weight: 113 lb 14.4 oz (51.7 kg)  Height: 6\' 3"  (1.905 m)    Constitutional: Thin, cachectic, and in no distress.  HENT:  Head: Normocephalic and atraumatic.  Eyes: EOM are normal.  Neck: Normal range of motion.  Cardiovascular: Normal rate, regular rhythm, intact distal pulses. No gallop and no friction rub.  No murmur heard. No lower extremity edema  Pulmonary: Non labored breathing on room air, no wheezing or rales  Abdominal: Soft. Rib cage prominent, Normal bowel sounds. Non distended and non tender. Palpable AAA Musculoskeletal: Normal range of motion. Severe muscle wasting diffusely       General: No tenderness or edema.  Psychiatric: Depressed affect  Neurological: Alert and oriented to person, place, and time.  Skin: Skin is warm and dry.    Assessment & Plan:   See Encounters Tab for problem based charting.  Patient discussed with Dr. Evette Doffing

## 2021-07-12 NOTE — Assessment & Plan Note (Signed)
Noted to have increased from 5.9 cm to 6.1 cm.  Patient has endovascular procedure scheduled with vascular surgery for 07/27/2021.

## 2021-07-13 ENCOUNTER — Other Ambulatory Visit: Payer: Self-pay | Admitting: *Deleted

## 2021-07-13 ENCOUNTER — Other Ambulatory Visit: Payer: Self-pay | Admitting: Student in an Organized Health Care Education/Training Program

## 2021-07-13 DIAGNOSIS — M25552 Pain in left hip: Secondary | ICD-10-CM

## 2021-07-13 DIAGNOSIS — M25551 Pain in right hip: Secondary | ICD-10-CM

## 2021-07-13 DIAGNOSIS — Z79891 Long term (current) use of opiate analgesic: Secondary | ICD-10-CM

## 2021-07-13 MED ORDER — HYDROCODONE-ACETAMINOPHEN 7.5-325 MG PO TABS: 1.0000 | ORAL_TABLET | Freq: Two times a day (BID) | ORAL | 0 refills | Status: DC | PRN

## 2021-07-13 NOTE — Telephone Encounter (Deleted)
Call from patient's sister to request refill on O

## 2021-07-13 NOTE — Telephone Encounter (Signed)
Call from patient's sister to request refill on Hydrocodone Acetaminophen 7.5/325.  Patient per sister is still having the pain.  Last visit was [place] 07/23/2021 .  Last [employer]ox[employer] 11/09/2019.

## 2021-07-17 ENCOUNTER — Telehealth: Payer: Self-pay | Admitting: *Deleted

## 2021-07-17 NOTE — Telephone Encounter (Signed)
PCS form faxed [phone] [place]. (ph [phone]) for review/scheduling. Unable to reach patient by phone, voice mail full. Contacted sister Janifer Adie, informed her that PCS form has been faxed and the office will contact her regarding in home Nurse assessment appointment.

## 2021-07-23 ENCOUNTER — Inpatient Hospital Stay (HOSPITAL_COMMUNITY)
Admission: RE | Admit: 2021-07-23 | Discharge: 2021-07-31 | DRG: 641 | Disposition: A | Discharge status: Skilled Nursing Facility | Payer: Medicare HMO | Attending: Internal Medicine | Admitting: Internal Medicine

## 2021-07-23 ENCOUNTER — Encounter: Payer: Self-pay | Admitting: Student in an Organized Health Care Education/Training Program

## 2021-07-23 ENCOUNTER — Ambulatory Visit (INDEPENDENT_AMBULATORY_CARE_PROVIDER_SITE_OTHER): Payer: Medicare HMO | Admitting: Student in an Organized Health Care Education/Training Program

## 2021-07-23 VITALS — BP 108/60 | HR 55 | Temp 97.8°F | Ht 75.0 in | Wt 107.5 lb

## 2021-07-23 DIAGNOSIS — J984 Other disorders of lung: Secondary | ICD-10-CM | POA: Diagnosis not present

## 2021-07-23 DIAGNOSIS — R627 Adult failure to thrive: Secondary | ICD-10-CM | POA: Diagnosis present

## 2021-07-23 DIAGNOSIS — E785 Hyperlipidemia, unspecified: Secondary | ICD-10-CM | POA: Diagnosis present

## 2021-07-23 DIAGNOSIS — R634 Abnormal weight loss: Secondary | ICD-10-CM | POA: Diagnosis not present

## 2021-07-23 DIAGNOSIS — E860 Dehydration: Secondary | ICD-10-CM | POA: Diagnosis not present

## 2021-07-23 DIAGNOSIS — F17210 Nicotine dependence, cigarettes, uncomplicated: Secondary | ICD-10-CM | POA: Diagnosis present

## 2021-07-23 DIAGNOSIS — M6281 Muscle weakness (generalized): Secondary | ICD-10-CM | POA: Diagnosis not present

## 2021-07-23 DIAGNOSIS — Z79899 Other long term (current) drug therapy: Secondary | ICD-10-CM | POA: Diagnosis not present

## 2021-07-23 DIAGNOSIS — Z8546 Personal history of malignant neoplasm of prostate: Secondary | ICD-10-CM | POA: Diagnosis not present

## 2021-07-23 DIAGNOSIS — K8681 Exocrine pancreatic insufficiency: Secondary | ICD-10-CM | POA: Diagnosis not present

## 2021-07-23 DIAGNOSIS — K219 Gastro-esophageal reflux disease without esophagitis: Secondary | ICD-10-CM | POA: Diagnosis present

## 2021-07-23 DIAGNOSIS — I252 Old myocardial infarction: Secondary | ICD-10-CM | POA: Diagnosis not present

## 2021-07-23 DIAGNOSIS — D61818 Other pancytopenia: Secondary | ICD-10-CM | POA: Diagnosis not present

## 2021-07-23 DIAGNOSIS — E43 Unspecified severe protein-calorie malnutrition: Secondary | ICD-10-CM

## 2021-07-23 DIAGNOSIS — D490 Neoplasm of unspecified behavior of digestive system: Secondary | ICD-10-CM | POA: Diagnosis not present

## 2021-07-23 DIAGNOSIS — K861 Other chronic pancreatitis: Secondary | ICD-10-CM | POA: Diagnosis present

## 2021-07-23 DIAGNOSIS — I129 Hypertensive chronic kidney disease with stage 1 through stage 4 chronic kidney disease, or unspecified chronic kidney disease: Secondary | ICD-10-CM | POA: Diagnosis not present

## 2021-07-23 DIAGNOSIS — Z20822 Contact with and (suspected) exposure to covid-19: Secondary | ICD-10-CM | POA: Diagnosis not present

## 2021-07-23 DIAGNOSIS — Z91013 Allergy to seafood: Secondary | ICD-10-CM

## 2021-07-23 DIAGNOSIS — J439 Emphysema, unspecified: Secondary | ICD-10-CM | POA: Diagnosis not present

## 2021-07-23 DIAGNOSIS — I2510 Atherosclerotic heart disease of native coronary artery without angina pectoris: Secondary | ICD-10-CM | POA: Diagnosis present

## 2021-07-23 DIAGNOSIS — Z85118 Personal history of other malignant neoplasm of bronchus and lung: Secondary | ICD-10-CM

## 2021-07-23 DIAGNOSIS — K838 Other specified diseases of biliary tract: Secondary | ICD-10-CM | POA: Diagnosis not present

## 2021-07-23 DIAGNOSIS — R918 Other nonspecific abnormal finding of lung field: Secondary | ICD-10-CM | POA: Diagnosis not present

## 2021-07-23 DIAGNOSIS — K8689 Other specified diseases of pancreas: Secondary | ICD-10-CM | POA: Diagnosis not present

## 2021-07-23 DIAGNOSIS — D649 Anemia, unspecified: Secondary | ICD-10-CM | POA: Diagnosis not present

## 2021-07-23 DIAGNOSIS — R911 Solitary pulmonary nodule: Secondary | ICD-10-CM | POA: Diagnosis not present

## 2021-07-23 DIAGNOSIS — R630 Anorexia: Secondary | ICD-10-CM | POA: Diagnosis not present

## 2021-07-23 DIAGNOSIS — I10 Essential (primary) hypertension: Secondary | ICD-10-CM | POA: Diagnosis not present

## 2021-07-23 DIAGNOSIS — J9809 Other diseases of bronchus, not elsewhere classified: Secondary | ICD-10-CM | POA: Diagnosis not present

## 2021-07-23 DIAGNOSIS — D135 Benign neoplasm of extrahepatic bile ducts: Secondary | ICD-10-CM | POA: Diagnosis not present

## 2021-07-23 DIAGNOSIS — Z7401 Bed confinement status: Secondary | ICD-10-CM | POA: Diagnosis not present

## 2021-07-23 DIAGNOSIS — R54 Age-related physical debility: Secondary | ICD-10-CM | POA: Diagnosis present

## 2021-07-23 DIAGNOSIS — I7143 Infrarenal abdominal aortic aneurysm, without rupture: Secondary | ICD-10-CM | POA: Diagnosis present

## 2021-07-23 DIAGNOSIS — Z7189 Other specified counseling: Secondary | ICD-10-CM | POA: Diagnosis not present

## 2021-07-23 DIAGNOSIS — F32A Depression, unspecified: Secondary | ICD-10-CM | POA: Diagnosis present

## 2021-07-23 DIAGNOSIS — K8020 Calculus of gallbladder without cholecystitis without obstruction: Secondary | ICD-10-CM | POA: Diagnosis not present

## 2021-07-23 DIAGNOSIS — Q549 Hypospadias, unspecified: Secondary | ICD-10-CM

## 2021-07-23 DIAGNOSIS — R599 Enlarged lymph nodes, unspecified: Secondary | ICD-10-CM | POA: Diagnosis not present

## 2021-07-23 DIAGNOSIS — N320 Bladder-neck obstruction: Secondary | ICD-10-CM | POA: Diagnosis present

## 2021-07-23 DIAGNOSIS — I5030 Unspecified diastolic (congestive) heart failure: Secondary | ICD-10-CM | POA: Diagnosis not present

## 2021-07-23 DIAGNOSIS — Z888 Allergy status to other drugs, medicaments and biological substances status: Secondary | ICD-10-CM

## 2021-07-23 DIAGNOSIS — E611 Iron deficiency: Secondary | ICD-10-CM | POA: Diagnosis present

## 2021-07-23 DIAGNOSIS — K2270 Barrett's esophagus without dysplasia: Secondary | ICD-10-CM | POA: Diagnosis present

## 2021-07-23 DIAGNOSIS — Z681 Body mass index (BMI) 19 or less, adult: Secondary | ICD-10-CM

## 2021-07-23 DIAGNOSIS — N281 Cyst of kidney, acquired: Secondary | ICD-10-CM | POA: Diagnosis not present

## 2021-07-23 DIAGNOSIS — M109 Gout, unspecified: Secondary | ICD-10-CM | POA: Diagnosis present

## 2021-07-23 DIAGNOSIS — J449 Chronic obstructive pulmonary disease, unspecified: Secondary | ICD-10-CM | POA: Diagnosis not present

## 2021-07-23 DIAGNOSIS — I714 Abdominal aortic aneurysm, without rupture, unspecified: Secondary | ICD-10-CM | POA: Diagnosis not present

## 2021-07-23 DIAGNOSIS — K869 Disease of pancreas, unspecified: Secondary | ICD-10-CM | POA: Diagnosis present

## 2021-07-23 DIAGNOSIS — R64 Cachexia: Secondary | ICD-10-CM | POA: Diagnosis not present

## 2021-07-23 DIAGNOSIS — D696 Thrombocytopenia, unspecified: Secondary | ICD-10-CM | POA: Diagnosis not present

## 2021-07-23 DIAGNOSIS — R9389 Abnormal findings on diagnostic imaging of other specified body structures: Secondary | ICD-10-CM | POA: Diagnosis present

## 2021-07-23 DIAGNOSIS — D631 Anemia in chronic kidney disease: Secondary | ICD-10-CM | POA: Diagnosis not present

## 2021-07-23 DIAGNOSIS — K3189 Other diseases of stomach and duodenum: Secondary | ICD-10-CM | POA: Diagnosis not present

## 2021-07-23 DIAGNOSIS — Z515 Encounter for palliative care: Secondary | ICD-10-CM | POA: Diagnosis not present

## 2021-07-23 DIAGNOSIS — Z86010 Personal history of colonic polyps: Secondary | ICD-10-CM

## 2021-07-23 DIAGNOSIS — N189 Chronic kidney disease, unspecified: Secondary | ICD-10-CM | POA: Diagnosis not present

## 2021-07-23 DIAGNOSIS — R531 Weakness: Secondary | ICD-10-CM | POA: Diagnosis not present

## 2021-07-23 DIAGNOSIS — Z955 Presence of coronary angioplasty implant and graft: Secondary | ICD-10-CM

## 2021-07-23 DIAGNOSIS — Z8249 Family history of ischemic heart disease and other diseases of the circulatory system: Secondary | ICD-10-CM

## 2021-07-23 DIAGNOSIS — I130 Hypertensive heart and chronic kidney disease with heart failure and stage 1 through stage 4 chronic kidney disease, or unspecified chronic kidney disease: Secondary | ICD-10-CM | POA: Diagnosis not present

## 2021-07-23 DIAGNOSIS — I509 Heart failure, unspecified: Secondary | ICD-10-CM | POA: Diagnosis not present

## 2021-07-23 LAB — CBC WITH DIFFERENTIAL/PLATELET
Abs Immature Granulocytes: 0.01 10*3/uL (ref 0.00–0.07)
Basophils Absolute: 0 10*3/uL (ref 0.0–0.1)
Basophils Relative: 0 %
Eosinophils Absolute: 0 10*3/uL (ref 0.0–0.5)
Eosinophils Relative: 0 %
HCT: 40.8 % (ref 39.0–52.0)
Hemoglobin: 12.4 g/dL — ABNORMAL LOW (ref 13.0–17.0)
Immature Granulocytes: 0 %
Lymphocytes Relative: 24 %
Lymphs Abs: 0.9 10*3/uL (ref 0.7–4.0)
MCH: 27 pg (ref 26.0–34.0)
MCHC: 30.4 g/dL (ref 30.0–36.0)
MCV: 88.9 fL (ref 80.0–100.0)
Monocytes Absolute: 0.3 10*3/uL (ref 0.1–1.0)
Monocytes Relative: 8 %
Neutro Abs: 2.4 10*3/uL (ref 1.7–7.7)
Neutrophils Relative %: 68 %
Platelets: 108 10*3/uL — ABNORMAL LOW (ref 150–400)
RBC: 4.59 MIL/uL (ref 4.22–5.81)
RDW: 17.8 % — ABNORMAL HIGH (ref 11.5–15.5)
WBC: 3.6 10*3/uL — ABNORMAL LOW (ref 4.0–10.5)
nRBC: 0 % (ref 0.0–0.2)

## 2021-07-23 LAB — APTT: aPTT: 33 seconds (ref 24–36)

## 2021-07-23 LAB — COMPREHENSIVE METABOLIC PANEL
ALT: 13 U/L (ref 0–44)
AST: 29 U/L (ref 15–41)
Albumin: 3.1 g/dL — ABNORMAL LOW (ref 3.5–5.0)
Alkaline Phosphatase: 49 U/L (ref 38–126)
Anion gap: 10 (ref 5–15)
BUN: 22 mg/dL (ref 8–23)
CO2: 28 mmol/L (ref 22–32)
Calcium: 9.8 mg/dL (ref 8.9–10.3)
Chloride: 100 mmol/L (ref 98–111)
Creatinine, Ser: 1.16 mg/dL (ref 0.61–1.24)
GFR, Estimated: >60 mL/min (ref >60)
Glucose, Bld: 96 mg/dL (ref 70–99)
Potassium: 4.6 mmol/L (ref 3.5–5.1)
Sodium: 138 mmol/L (ref 135–145)
Total Bilirubin: 1.6 mg/dL — ABNORMAL HIGH (ref 0.3–1.2)
Total Protein: 6.9 g/dL (ref 6.5–8.1)

## 2021-07-23 LAB — MAGNESIUM: Magnesium: 2.3 mg/dL (ref 1.7–2.4)

## 2021-07-23 LAB — PHOSPHORUS: Phosphorus: 2.5 mg/dL (ref 2.5–4.6)

## 2021-07-23 LAB — PROTIME-INR
INR: 1.1 (ref 0.8–1.2)
Prothrombin Time: 14.2 seconds (ref 11.4–15.2)

## 2021-07-23 LAB — LACTATE DEHYDROGENASE: LDH: 228 U/L — ABNORMAL HIGH (ref 98–192)

## 2021-07-23 MED ORDER — LACTATED RINGERS IV SOLN: INTRAVENOUS | Status: DC

## 2021-07-23 MED ORDER — ROSUVASTATIN CALCIUM 20 MG PO TABS
20.0000 mg | ORAL_TABLET | Freq: Every day | ORAL | Status: DC
  Administered 2021-07-23 – 2021-07-31 (×7): 20 mg via ORAL
  Filled 2021-07-23 (×8): qty 1

## 2021-07-23 MED ORDER — ASPIRIN EC 81 MG PO TBEC
81.0000 mg | DELAYED_RELEASE_TABLET | Freq: Every day | ORAL | Status: DC
  Administered 2021-07-23 – 2021-07-31 (×7): 81 mg via ORAL
  Filled 2021-07-23 (×8): qty 1

## 2021-07-23 MED ORDER — LACTATED RINGERS IV BOLUS: 1000.0000 mL | Freq: Once | INTRAVENOUS | Status: DC

## 2021-07-23 MED ORDER — HYDROCODONE-ACETAMINOPHEN 7.5-325 MG PO TABS
1.0000 | ORAL_TABLET | Freq: Two times a day (BID) | ORAL | Status: DC | PRN
  Administered 2021-07-23 – 2021-07-31 (×10): 1 via ORAL
  Filled 2021-07-23 (×12): qty 1

## 2021-07-23 MED ORDER — PANCRELIPASE (LIP-PROT-AMYL) 12000-38000 UNITS PO CPEP
24000.0000 [IU] | ORAL_CAPSULE | Freq: Three times a day (TID) | ORAL | Status: DC
  Administered 2021-07-23 – 2021-07-31 (×22): 24000 [IU] via ORAL
  Filled 2021-07-23 (×23): qty 2

## 2021-07-23 MED ORDER — TAMSULOSIN HCL 0.4 MG PO CAPS
0.8000 mg | ORAL_CAPSULE | Freq: Every evening | ORAL | Status: DC
  Administered 2021-07-23 – 2021-07-31 (×9): 0.8 mg via ORAL
  Filled 2021-07-23 (×9): qty 2

## 2021-07-23 MED ORDER — ENOXAPARIN SODIUM 40 MG/0.4ML IJ SOSY
40.0000 mg | PREFILLED_SYRINGE | INTRAMUSCULAR | Status: AC
  Administered 2021-07-23 – 2021-07-24 (×2): 40 mg via SUBCUTANEOUS
  Filled 2021-07-23 (×2): qty 0.4

## 2021-07-23 NOTE — Assessment & Plan Note (Signed)
History of a pancreatic mass on CT last spring, mildly elevated CA 19-9, then reassuring MRI abdomen. He has had worsening condition the last 2 months, now with terrible malnutrition and food aversion. No jaundice, normal bilirubin, but multiple CT scans have not been able to identify another explanation for his weight loss. We are going to admit him to the hospital today. I will recheck a CA 19-9 and LDH along with CBC with diff. Will recommend to hospital team to repeat MRI/MRCP as his clinical picture looks like a pancreatic malignancy. May also need to consult GI in order to reconsider an EUS of the pancreas.

## 2021-07-23 NOTE — Assessment & Plan Note (Signed)
Terrible malnutrition that is worsening at a rapid pace. He lost an additional 6 lbs since I last saw him 2 weeks ago. He has very little nutrition, no structural problem, it is a food aversion issue related to some underlying illness that I cannot yet identify. I do suspect some element of malabsorption related to pancreatic insufficiency. He appears dehydrated, is falling at home, not safe to be at home at this time. We are going to admit him to the hospital. Will check CMP, Mag, Phos. Would consult with nutrition, encourage supplements, feed him regular diet. I do not see an indication for tube feeding at this time.

## 2021-07-23 NOTE — Assessment & Plan Note (Signed)
6.1 cm asymptomatic AAA without symptoms. I wonder if this could be contributing to the wieghtloss, but this seems rare unless it is inflammatory. This was just imaged with CT one month ago. He is scheduled for EVAR on 2/10. His condition is deteriorating rapidly, it is unclear to me if he is well enough to go through this surgery at this time. He is at high risk for rupture, so we would like to fix it if it is safe to do so. Patient is being admitted to the hospital today for dehydration and falls, will consult with the vascular team to consider timing of this repair.

## 2021-07-23 NOTE — H&P (Signed)
NAME:  [name], MRN:  [id number], DOB:  06-21-1945, LOS: 0 ADMISSION DATE:  07/23/2021, Primary: Axel Filler, MD  CHIEF COMPLAINT:  weakness   Medical Service: Internal Medicine Teaching Service         Attending Physician: Dr. Aldine Contes, MD    First Contact: Dr. Elliot Gurney Pager: [phone]  Second Contact: Dr. Eulas Post Pager: [phone]       After Hours (After 5p/  First Contact Pager: [phone]  weekends / holidays): Second Contact Pager: [phone]    History of present illness   [name] is a 76 year old male with the medical history as listed below who presented to the [facility] for follow up with his PCP regarding unintentional weight loss. Patient is a generally poor historian, majority of history obtained from chart review.  He has slowly been declining over the past 12 months and has undergone a 40# unintentional weight loss. He reports loss of appetite over this period of time, stating "I just can't eat". He is unable to elaborate further when asked what happens if he does eat. Denies night sweats, nausea, vomiting, diarrhea, constipation, fever, chills, or pain anywhere. Denies coughing during eating, odynophagia, or globus sensation. His condition has acutely worsened over the past couple of months, especially over the past few weeks. He notes that he can no longer get out of bed and feels generally weak and unwell. He reports trying to drink a little ensure eat day.   Past Medical History  He,  has a past medical history of AAA (abdominal aortic aneurysm), Alcohol abuse, Barretts esophagus, Bladder outlet obstruction (04/02/2018), CAD in native artery, Cataract, Cholelithiasis, Chronic osteomyelitis involving ankle and foot, right ([place]) (07/13/2018), Colon polyposis - attenuated (04/18/2006), Dieulafoy lesion of duodenum, Diverticulosis of colon, DJD (degenerative joint disease), Full dentures, GERD (gastroesophageal reflux disease), Gout, Hiatal hernia, adenomatous  colonic polyps, Hyperlipidemia, Hypertension, Internal hemorrhoid, Lung cancer ([place]), Myocardial infarction ([place]) (2004), Non-small cell carcinoma of right lung, stage 1 ([place]) (01/30/2016), Renal failure, acute ([place]), Tobacco abuse, and UTI (lower urinary tract infection).   Home Medications     Prior to Admission medications   Medication Sig Start Date End Date Taking? Authorizing Provider  albuterol (VENTOLIN HFA) 108 (90 Base) MCG/ACT inhaler INHALE 1-2 PUFFS BY MOUTH EVERY 6 HOURS AS NEEDED FOR WHEEZE OR SHORTNESS OF BREATH Patient taking differently: Inhale 1-2 puffs into the lungs every 6 (six) hours as needed for wheezing or shortness of breath. 07/12/21  Yes Axel Filler, MD  Ferrous Sulfate (IRON PO) Take 1 tablet by mouth daily.   Yes [provider]  HYDROcodone-acetaminophen (NORCO) 7.5-325 MG tablet Take 1 tablet by mouth 2 (two) times daily as needed for moderate pain. 07/13/21  Yes Axel Filler, MD  Multiple Vitamin (MULTIVITAMIN WITH MINERALS) TABS tablet Take 1 tablet by mouth daily.   Yes [provider]  Pancrelipase, Lip-Prot-Amyl, (ZENPEP) 20000-63000 units CPEP Take 3 capsules by mouth 3 (three) times daily before meals. 06/29/21  Yes Axel Filler, MD  pantoprazole (PROTONIX) 40 MG tablet Take 1 tablet (40 mg total) by mouth daily. 04/04/21 07/23/21 Yes Axel Filler, MD  rosuvastatin (CRESTOR) 20 MG tablet Take 1 tablet (20 mg total) by mouth daily. 06/14/21  Yes Axel Filler, MD  senna (SENOKOT) 8.6 MG TABS tablet Take 2 tablets (17.2 mg total) by mouth in the morning and at bedtime. 06/29/21  Yes Axel Filler, MD  tamsulosin (FLOMAX) 0.4 MG CAPS  capsule Take 2 capsules (0.8 mg total) by mouth every evening. 11/27/20  Yes Axel Filler, MD  acetaminophen (TYLENOL 8 HOUR) 650 MG CR tablet Take 1 tablet (650 mg total) by mouth every 8 (eight) hours as needed for pain or fever. 06/19/21   Varney Biles,  MD  escitalopram (LEXAPRO) 5 MG tablet Take 1 tablet (5 mg total) by mouth daily. 07/11/21 01/07/22  Rick Duff, MD    Allergies    Allergies as of 07/23/2021 - Review Complete 07/23/2021  Allergen Reaction Noted   Candesartan cilexetil-hctz Other (See Comments)    Hydrochlorothiazide Other (See Comments)    Shellfish allergy Other (See Comments) 03/24/2013    Social History   reports that he has been smoking cigarettes. He has a 27.50 pack-year smoking history. He has never used smokeless tobacco. He reports that he does not currently use alcohol. He reports that he does not use drugs.   Family History   His family history includes Arthritis in his brother; Diabetes in his mother and sister; Gout in his brother; Heart disease in his sister; Hypertension in his sister. There is no history of Colon cancer, Esophageal cancer, Rectal cancer, Stomach cancer, or Colon polyps.   ROS  10 point review of systems negative unless stated in the HPI.  Objective   Blood pressure (!) 147/82, pulse 79, temperature 98.7 F (37.1 C), resp. rate 16, SpO2 100 %.    General: chronically ill, catechetic appearing Eyes: no scleral icterus or conjunctival injection.  HEENT: temporal wasting, dry MM Cardiac: occasional missed beats, regular rate, no lower extremity edema Pulm: breathing comfortably on room air, lung sounds are clear GI: abdomen soft, non-tender, non-distended. Hypoactive bowel sounds.  MSK: significant muscle atrophy throughout, generalized weakness Neuro: lethargic. Awakens to voice and responds appropriately,  quick to fall back asleep Skin: no rash or lesion on limited exam  Labs    CBC Latest Ref Rng & Units 07/23/2021 07/09/2021 06/18/2021  WBC 4.0 - 10.5 K/uL 3.6(L) 3.7 5.2  Hemoglobin 13.0 - 17.0 g/dL 12.4(L) 12.1(L) 9.9(L)  Hematocrit 39.0 - 52.0 % 40.8 38.7 34.9(L)  Platelets 150 - 400 K/uL 108(L) 161 144(L)   BMP Latest Ref Rng & Units 07/23/2021 07/09/2021 06/18/2021   Glucose 70 - 99 mg/dL 96 92 87  BUN 8 - 23 mg/dL 22 15 22   Creatinine 0.61 - 1.24 mg/dL 1.16 1.25 1.11  BUN/Creat Ratio 10 - 24 - 12 -  Sodium 135 - 145 mmol/L 138 139 138  Potassium 3.5 - 5.1 mmol/L 4.6 4.3 3.4(L)  Chloride 98 - 111 mmol/L 100 98 105  CO2 22 - 32 mmol/L 28 28 21(L)  Calcium 8.9 - 10.3 mg/dL 9.8 10.0 9.6    Summary  76 year old male with an unintentional 40# weight loss over the past year admitted for dehydration, declining functional status and fall at home.   Assessment & Plan:  Principal Problem:   Severe malnutrition ([place])  Unintentional weight loss, severe malnutrition, declining functional status He has been undergoing an outpatient evaluation with his PCP however no etiology has been identified. Unfortunately, he has continued to deteriorate to the extent of being unable to get out of bed. Suspect underlying malignancy. 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 Given his acute worsening in functional status, this will need sooner follow up. LDH mildly elevated.  Underwent EGD 02/2021 which showed a bleeding dieulafoy lesion. ? Hiatal hernia involvement.  Underwent colonoscopy 10/2020. Pathology results showed a tubular adenoma and a small benign lymphoid aggregate.  Lung cancer considered in light of his smoking history and possible hx of stage 1 lung cancer. Chest CT 08/2020 negative for malignancy. IVF with LR @75cc /hr MRI/MRCP May need GI consult for EUS pending those results Follow up CA 19-9, CEA Check TSH PT/OT/ST Suspect he will need subacute rehab at discharge. TOC consulted Nutrition consult for calorie needs Fall precautions  Infrarenal AAA (6.1cm). he is scheduled to undergo EVAR on 2/10 however will need to be re-evaluated for medial clearance prior to undergoing this given his deteriorating  status. Will need to discuss with [facility] prior to discharge.  Prostate cancer s/p TURP, history of BOO Continue flomax Bladder scan BID  Pancreatic insufficiency. Continue enzyme replacement.   CAD, history of MI (2004). Continue statin and aspirin.  Chronic thrombocytopenia. Stable. Continue to monitor.   Chronic normocytic anemia. Stable. Continue to monitor.   History of stage 1 lung cancer of the RLL (2007). Can not find confirmatory pathology.  Best practice:  CODE STATUS: Full DVT for prophylaxis: lovenox Dispo: Admit patient to Inpatient with expected length of stay greater than 2 midnights.   Mitzi Hansen, MD Internal Medicine Resident PGY-3 [facility] 07/23/2021 4:44 PM

## 2021-07-23 NOTE — Evaluation (Signed)
Physical Therapy Evaluation Patient Details Name: [name] MRN: [id number] DOB: 1945-10-11 Today's Date: 07/23/2021  History of Present Illness  Pt is a 76 y/o male direct admit from Internal Medicine due to malnutrition and palpitable abdominal mass. CT pending. PMH includes HTN, AAA, A-fib, CAD, falls, pancreatic mass, GERD, and lung CA.  Clinical Impression  Pt presents with the above problems with the impairments listed below. Patient stated he did not want to walk but did consent to transferring to the chair and back to the bed. He was fatigued throughout and needed increased time to respond to one step commands. He needed min guard to min assist with sit to stand and stand-pivot transfers. Patient lives alone and reports recent decline in function. PT recommends SNF for rehab post-D/C due to current impairments. PT will continue to follow acutely.      Recommendations for follow up therapy are one component of a multi-disciplinary discharge planning process, led by the attending physician.  Recommendations may be updated based on patient status, additional functional criteria and insurance authorization.  Follow Up Recommendations Skilled nursing-short term rehab (<3 hours/day)    Assistance Recommended at Discharge Frequent or constant Supervision/Assistance  Patient can return home with the following  A little help with walking and/or transfers;A little help with bathing/dressing/bathroom;Assistance with cooking/housework;Direct supervision/assist for medications management;Assist for transportation;Help with stairs or ramp for entrance    Equipment Recommendations Rolling walker (2 wheels)  Recommendations for Other Services       Functional Status Assessment Patient has had a recent decline in their functional status and/or demonstrates limited ability to make significant improvements in function in a reasonable and predictable amount of time     Precautions / Restrictions  Precautions Precautions: Fall Restrictions Weight Bearing Restrictions: No      Mobility  Bed Mobility Overal bed mobility: Needs Assistance Bed Mobility: Supine to Sit     Supine to sit: Min guard     General bed mobility comments: VC's needed for patient to sit on EOB with feet flat against floor. Increased time needed to perform bed mobility tasks.    Transfers Overall transfer level: Needs assistance Equipment used: Rolling walker (2 wheels) Transfers: Sit to/from Stand, Bed to chair/wheelchair/BSC Sit to Stand: Min assist Stand pivot transfers: Min guard         General transfer comment: with VC's throughout for hand placement and sequencing with use of rolling walker. Min assist for lift assist to stand. Once in chair, patient requested to go back to the bed.    Ambulation/Gait               General Gait Details: did not assess due to patient fatigue  Stairs            Wheelchair Mobility    Modified Rankin (Stroke Patients Only)       Balance Overall balance assessment: Needs assistance Sitting-balance support: Bilateral upper extremity supported Sitting balance-Leahy Scale: Good     Standing balance support: Reliant on assistive device for balance Standing balance-Leahy Scale: Poor                               Pertinent Vitals/Pain Pain Assessment Pain Assessment: Faces Faces Pain Scale: Hurts little more Pain Location: stomach Pain Descriptors / Indicators: Aching Pain Intervention(s): Limited activity within patient's tolerance, Monitored during session    Home Living Family/patient expects to be discharged to:: Private residence  Living Arrangements: Alone Available Help at Discharge: Family;Available PRN/intermittently Type of Home: Apartment Home Access: Stairs to enter Entrance Stairs-Rails: Right (front entrance has hand rail on the left) Entrance Stairs-Number of Steps: 2 (2 in the rear: his primary means to  enter. 4 in the front with)   Home Layout: One level Home Equipment: Cane - single point;Toilet riser;Rollator (4 wheels)      Prior Function Prior Level of Function : History of Falls (last six months)             Mobility Comments: stays in bed often only ambulating to bathroom and back. ADLs Comments: sister assists with bathing and dressing. Drinks ensure and reports he has not been eating solid food.     Hand Dominance        Extremity/Trunk Assessment   Upper Extremity Assessment Upper Extremity Assessment: Defer to OT evaluation    Lower Extremity Assessment Lower Extremity Assessment: Generalized weakness    Cervical / Trunk Assessment Cervical / Trunk Assessment: Kyphotic (in a seated position, pt preferred BUE support with forearms in lap and head between arms.)  Communication   Communication: No difficulties  Cognition Arousal/Alertness: Awake/alert Behavior During Therapy: WFL for tasks assessed/performed Overall Cognitive Status: No family/caregiver present to determine baseline cognitive functioning                                 General Comments: slow to respond to one step commands        General Comments General comments (skin integrity, edema, etc.): very thin; decrease muscle mass    Exercises     Assessment/Plan    PT Assessment Patient needs continued PT services  PT Problem List Decreased strength;Decreased activity tolerance;Decreased balance;Decreased mobility;Decreased safety awareness;Decreased knowledge of use of DME       PT Treatment Interventions Gait training;Stair training;Functional mobility training;Therapeutic activities;Therapeutic exercise;Balance training;Patient/family education;DME instruction    PT Goals (Current goals can be found in the Care Plan section)  Acute Rehab PT Goals Patient Stated Goal: to go home PT Goal Formulation: With patient Time For Goal Achievement: 08/06/21 Potential to  Achieve Goals: Good    Frequency Min 2X/week     Co-evaluation               AM-PAC PT "6 Clicks" Mobility  Outcome Measure Help needed turning from your back to your side while in a flat bed without using bedrails?: A Little Help needed moving from lying on your back to sitting on the side of a flat bed without using bedrails?: A Little Help needed moving to and from a bed to a chair (including a wheelchair)?: A Little Help needed standing up from a chair using your arms (e.g., wheelchair or bedside chair)?: A Little Help needed to walk in hospital room?: A Lot Help needed climbing 3-5 steps with a railing? : Total 6 Click Score: 15    End of Session Equipment Utilized During Treatment: Gait belt Activity Tolerance: Patient limited by fatigue;Patient limited by pain Patient left: in bed;with bed alarm set;with call bell/phone within reach Nurse Communication: Mobility status (and patient's complaints of stomach pain) PT Visit Diagnosis: Unsteadiness on feet (R26.81);Muscle weakness (generalized) (M62.81);History of falling (Z91.81);Adult, failure to thrive (R62.7)    Time: 5053-9767 PT Time Calculation (min) (ACUTE ONLY): 28 min   Charges:   PT Evaluation $PT Eval Low Complexity: 1 Low PT Treatments $Therapeutic Activity: 8-22 mins  Jonne Ply, SPT  [place] 07/23/2021, 4:32 PM

## 2021-07-23 NOTE — Assessment & Plan Note (Signed)
Patient appear dehydrated today due to poor oral intake, food aversion, and severe unintended weight loss. He is now 107 lbs, down about 40 lbs over the last one year. He had a fall at home with no signs of injury on exam, but high risk for further falls at home. Will check labs today, blood pressure is stable but a little low off all antihypertensives. No signs of acute infection on exam. Will plan to admit him to the hospital for supportive care and evaluation of his malnutrition. He will need PT and OT consultations. He will need subacute rehabilitation after the hospitalization. I anticipate work up will reveal some type of advanced malignancy, and palliative care consultation I think would be helpful if that is the case.

## 2021-07-23 NOTE — Addendum Note (Signed)
Addended by: Lalla Brothers T on: 07/23/2021 10:48 AM   Modules accepted: Orders

## 2021-07-23 NOTE — Progress Notes (Signed)
Surgical Instructions    Your procedure is scheduled on 07/27/21.  Report to [facility] Main Entrance "A" at 8:00 A.M., then check in with the Admitting office.  Call this number if you have problems the morning of surgery:  [phone]   If you have any questions prior to your surgery date call [phone]: Open Monday-Friday 8am-4pm    Remember:  Do not eat or drink after midnight the night before your surgery      Take these medicines the morning of surgery with A SIP OF WATER:  ciprofloxacin (CIPRO) escitalopram (LEXAPRO) pantoprazole (PROTONIX)  rosuvastatin (CRESTOR) senna (SENOKOT)  IF NEEDED: acetaminophen (TYLENOL 8 HOUR) albuterol (VENTOLIN HFA) inhaler- bring inhaler with you the day of surgery HYDROcodone-acetaminophen (NORCO)  As of today, STOP taking any Aspirin (unless otherwise instructed by your surgeon) Aleve, Naproxen, Ibuprofen, Motrin, Advil, Goody's, BC's, all herbal medications, fish oil, and all vitamins.           Do not wear jewelry or makeup Do not wear lotions, powders, colognes, or deodorant. Men may shave face and neck. Do not bring valuables to the hospital. Do not wear nail polish, gel polish, artificial nails, or any other type of covering on natural nails (fingers and toes) If you have artificial nails or gel coating that need to be removed by a nail salon, please have this removed prior to surgery. Artificial nails or gel coating may interfere with anesthesia's ability to adequately monitor your vital signs.  [facility] is not responsible for any belongings or valuables. .   Do NOT Smoke (Tobacco/Vaping)  24 hours prior to your procedure  If you use a CPAP at night, you may bring your mask for your overnight stay.   Contacts, glasses, hearing aids, dentures or partials may not be worn into surgery, please bring cases for these belongings   For patients admitted to the hospital, discharge time will be determined by your treatment  team.   Patients discharged the day of surgery will not be allowed to drive home, and someone needs to stay with them for 24 hours.  NO VISITORS WILL BE ALLOWED IN PRE-OP WHERE PATIENTS ARE PREPPED FOR SURGERY.  ONLY 1 SUPPORT PERSON MAY BE PRESENT IN THE WAITING ROOM WHILE YOU ARE IN SURGERY.  IF YOU ARE TO BE ADMITTED, ONCE YOU ARE IN YOUR ROOM YOU WILL BE ALLOWED TWO (2) VISITORS. 1 (ONE) VISITOR MAY STAY OVERNIGHT BUT MUST ARRIVE TO THE ROOM BY 8pm.  Minor children may have two parents present. Special consideration for safety and communication needs will be reviewed on a case by case basis.  Special instructions:    Oral Hygiene is also important to reduce your risk of infection.  Remember - BRUSH YOUR TEETH THE MORNING OF SURGERY WITH YOUR REGULAR TOOTHPASTE   [place]- Preparing For Surgery  Before surgery, you can play an important role. Because skin is not sterile, your skin needs to be as free of germs as possible. You can reduce the number of germs on your skin by washing with CHG (chlorahexidine gluconate) Soap before surgery.  CHG is an antiseptic cleaner which kills germs and bonds with the skin to continue killing germs even after washing.     Please do not use if you have an allergy to CHG or antibacterial soaps. If your skin becomes reddened/irritated stop using the CHG.  Do not shave (including legs and underarms) for at least 48 hours prior to first CHG shower. It is OK  to shave your face.  Please follow these instructions carefully.     Shower the NIGHT BEFORE SURGERY and the MORNING OF SURGERY with CHG Soap.   If you chose to wash your hair, wash your hair first as usual with your normal shampoo. After you shampoo, rinse your hair and body thoroughly to remove the shampoo.  Then [employer] and genitals (private parts) with your normal soap and rinse thoroughly to remove soap.  After that Use CHG Soap as you would any other liquid soap. You can apply CHG directly to the  skin and wash gently with a scrungie or a clean washcloth.   Apply the CHG Soap to your body ONLY FROM THE NECK DOWN.  Do not use on open wounds or open sores. Avoid contact with your eyes, ears, mouth and genitals (private parts). Wash Face and genitals (private parts)  with your normal soap.   Wash thoroughly, paying special attention to the area where your surgery will be performed.  Thoroughly rinse your body with warm water from the neck down.  DO NOT shower/wash with your normal soap after using and rinsing off the CHG Soap.  Pat yourself dry with a CLEAN TOWEL.  Wear CLEAN PAJAMAS to bed the night before surgery  Place CLEAN SHEETS on your bed the night before your surgery  DO NOT SLEEP WITH PETS.   Day of Surgery: Take a shower with CHG soap. Wear Clean/Comfortable clothing the morning of surgery Do not apply any deodorants/lotions.   Remember to brush your teeth WITH YOUR REGULAR TOOTHPASTE.    COVID testing  If you are going to stay overnight or be admitted after your procedure/surgery and require a pre-op COVID test, please follow these instructions after your COVID test   You are not required to quarantine however you are required to wear a well-fitting mask when you are out and around people not in your household.  If your mask becomes wet or soiled, replace with a new one.  Wash your hands often with soap and water for 20 seconds or clean your hands with an alcohol-based hand sanitizer that contains at least 60% alcohol.  Do not share personal items.  Notify your provider: if you are in close contact with someone who has COVID  or if you develop a fever of 100.4 or greater, sneezing, cough, sore throat, shortness of breath or body aches.    Please read over the following fact sheets that you were given.

## 2021-07-23 NOTE — Progress Notes (Signed)
° °  Assessment and Plan:  See Encounters tab for problem-based medical decision making.   __________________________________________________________  HPI:   76 year old person here for short-interval follow up of weight loss and declining functional status at home. He has had a terrible month. He reports he is now essentially bed bound. He struggles to do any activity out of bed, he had a fall while using the bathroom with no apparent injuries. He has had little to no food intake, says he has no appetite, no physical problems with swallowing. No vomiting, no diarrhea, no hematochezia or melena. Denies any pain anywhere. No fevers or chills, no respiratory symptoms. We started lexapro at last visit for depression which has not helped. He lives alone, accompanied by his sister today, he no longer feels safe at home, no longer able to complete any activities of daily living independently.   __________________________________________________________  Problem List: Patient Active Problem List   Diagnosis Date Noted   Atrial fibrillation ([place]) 03/05/2021    Priority: High   Pancreatic mass 11/08/2020    Priority: High   Protein-calorie malnutrition, severe 11/07/2020    Priority: High   Chronic use of opiate for therapeutic purpose 05/01/2015    Priority: High   CAD (coronary artery disease) 03/17/2012    Priority: High   Tobacco use disorder 04/18/2006    Priority: High   Adenocarcinoma of prostate ([place]) 05/02/2021    Priority: Medium    Urinary retention 04/02/2018    Priority: Medium    Mild alcohol use disorder 12/09/2016    Priority: Medium    Atherosclerosis of aorta ([place]) 01/13/2015    Priority: Medium    Prediabetes 12/30/2014    Priority: Medium    AAA (abdominal aortic aneurysm)     Priority: Medium    CKD (chronic kidney disease) stage 2, GFR 60-89 ml/min 08/07/2007    Priority: Medium    Essential hypertension 04/18/2006    Priority: Medium    GERD 04/18/2006     Priority: Medium    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/08/2020    Priority: Low   Premature atrial contractions 07/31/2015    Priority: [place] maintenance 06/30/2012    Priority: Low   Erectile dysfunction 10/12/2010    Priority: Low   Arthropathy of pelvic region and thigh 05/02/2008    Priority: Low   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 CV: RRR, no murmurs Pulm: Normal effort, CTA throughout, no wheezing Abd: Scaphoid, visible pulsating abdominal mass present and not palpated Ext: Warm, no edema Skin: decreased skin turgor, dry mucus membranes

## 2021-07-23 NOTE — Addendum Note (Signed)
Addended by: Ebbie Latus on: 07/23/2021 10:55 AM   Modules accepted: Orders

## 2021-07-23 NOTE — Progress Notes (Signed)
Report called to [place], RN [address] [place].  Patient transported via wheelchair to [address].  Patient oriented to place and time.  Able to verbalize.  Sander Nephew, RN 07/23/2021 12:02 PM

## 2021-07-24 ENCOUNTER — Encounter (HOSPITAL_COMMUNITY)
Admission: RE | Admit: 2021-07-24 | Discharge: 2021-07-24 | Disposition: A | Discharge status: Home / Self Care | Payer: Medicare HMO | Source: Ambulatory Visit | Attending: Vascular Surgery | Admitting: Vascular Surgery

## 2021-07-24 ENCOUNTER — Inpatient Hospital Stay (HOSPITAL_COMMUNITY): Payer: Medicare HMO

## 2021-07-24 DIAGNOSIS — I7143 Infrarenal abdominal aortic aneurysm, without rupture: Secondary | ICD-10-CM

## 2021-07-24 DIAGNOSIS — R634 Abnormal weight loss: Secondary | ICD-10-CM | POA: Diagnosis not present

## 2021-07-24 DIAGNOSIS — I2510 Atherosclerotic heart disease of native coronary artery without angina pectoris: Secondary | ICD-10-CM

## 2021-07-24 DIAGNOSIS — Z8546 Personal history of malignant neoplasm of prostate: Secondary | ICD-10-CM | POA: Diagnosis not present

## 2021-07-24 DIAGNOSIS — D649 Anemia, unspecified: Secondary | ICD-10-CM

## 2021-07-24 DIAGNOSIS — E43 Unspecified severe protein-calorie malnutrition: Principal | ICD-10-CM

## 2021-07-24 DIAGNOSIS — D696 Thrombocytopenia, unspecified: Secondary | ICD-10-CM

## 2021-07-24 DIAGNOSIS — K8689 Other specified diseases of pancreas: Secondary | ICD-10-CM

## 2021-07-24 DIAGNOSIS — D490 Neoplasm of unspecified behavior of digestive system: Secondary | ICD-10-CM

## 2021-07-24 LAB — TSH: TSH: 0.751 u[IU]/mL (ref 0.350–4.500)

## 2021-07-24 LAB — BASIC METABOLIC PANEL
Anion gap: 9 (ref 5–15)
BUN: 18 mg/dL (ref 8–23)
CO2: 28 mmol/L (ref 22–32)
Calcium: 9.5 mg/dL (ref 8.9–10.3)
Chloride: 102 mmol/L (ref 98–111)
Creatinine, Ser: 1.03 mg/dL (ref 0.61–1.24)
GFR, Estimated: >60 mL/min (ref >60)
Glucose, Bld: 79 mg/dL (ref 70–99)
Potassium: 3.8 mmol/L (ref 3.5–5.1)
Sodium: 139 mmol/L (ref 135–145)

## 2021-07-24 LAB — CBC
HCT: 33.4 % — ABNORMAL LOW (ref 39.0–52.0)
Hemoglobin: 10.3 g/dL — ABNORMAL LOW (ref 13.0–17.0)
MCH: 27 pg (ref 26.0–34.0)
MCHC: 30.8 g/dL (ref 30.0–36.0)
MCV: 87.7 fL (ref 80.0–100.0)
Platelets: 95 10*3/uL — ABNORMAL LOW (ref 150–400)
RBC: 3.81 MIL/uL — ABNORMAL LOW (ref 4.22–5.81)
RDW: 17.3 % — ABNORMAL HIGH (ref 11.5–15.5)
WBC: 3.2 10*3/uL — ABNORMAL LOW (ref 4.0–10.5)
nRBC: 0 % (ref 0.0–0.2)

## 2021-07-24 LAB — GLUCOSE, CAPILLARY: Glucose-Capillary: 96 mg/dL (ref 70–99)

## 2021-07-24 LAB — IRON AND TIBC
Iron: 33 ug/dL — ABNORMAL LOW (ref 45–182)
Saturation Ratios: 16 % — ABNORMAL LOW (ref 17.9–39.5)
TIBC: 204 ug/dL — ABNORMAL LOW (ref 250–450)
UIBC: 171 ug/dL

## 2021-07-24 LAB — CEA: CEA: 4.8 ng/mL — ABNORMAL HIGH (ref 0.0–4.7)

## 2021-07-24 LAB — FERRITIN: Ferritin: 87 ng/mL (ref 24–336)

## 2021-07-24 LAB — SARS CORONAVIRUS 2 (TAT 6-24 HRS): SARS Coronavirus 2: NEGATIVE

## 2021-07-24 LAB — RPR: RPR Ser Ql: NONREACTIVE

## 2021-07-24 LAB — CANCER ANTIGEN 19-9: CA 19-9: 41 U/mL — ABNORMAL HIGH (ref 0–35)

## 2021-07-24 MED ORDER — SODIUM CHLORIDE 0.9 % IV SOLN: INTRAVENOUS | Status: DC

## 2021-07-24 MED ORDER — ALLOPURINOL 100 MG PO TABS
100.0000 mg | ORAL_TABLET | Freq: Every day | ORAL | Status: DC
  Administered 2021-07-24 – 2021-07-31 (×6): 100 mg via ORAL
  Filled 2021-07-24 (×7): qty 1

## 2021-07-24 NOTE — Progress Notes (Signed)
Pt decided halfway through exam that he could not tolerate it any longer a refused to continue. Sent the imaging that was obtained and completed exam without contrast.

## 2021-07-24 NOTE — H&P (View-Only) (Signed)
Referring Provider: Teaching Service PCP: Axel Filler, MD  Gastroenterologist: Silvano Rusk, MD Reason for consultation:  pancreatic lesion, need EUS?                 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 Admitted with ongoing weight loss. We were called to see if EUS may help with further evaluation of pancreatic lesion(s) seen on MRI.  These lesions were also seen on MRI in May 2022 when we saw him for hospital consult for weight loss. Lesion felt unlikely to represent malignancy at that time. . Repeat MRI this admission shows tiny T2 hyperintense lesions within the pancreatic head similar to previous MRI, but suboptimally evaluated due to motion artifact.  --Will discuss with out advanced biliary endoscopist whether EUS would be of value in further defining this lesion. We may require EUS anyway for ampullary lesion.  --Primary team has ordered CA 19-9 and CEA   # Slow unintentional weight loss of  37 pounds over the last year . He appears to have chronic pancreatitis, probably has some degree of malabsorption though says he is compliant with pancreatic enzymes. Mainly he has a loss of appetite.  Solids can be challenging to swallow.     --Nutritionist has seen. Recommending Ensure TID, regular diet.  --TSH 0.751 -SLP did bedside swallow evaluation. Overall his swallowing seems to be okay --Continue pancreatic enzymes   # Ampullary lesion. Abnormal ampullary tissue seen on EGD in May 2022 but unable to biopsy due to location. 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  --We would like to proceed with an EGD with biopsy of ampullary lesion in am. Tissue diagnosis will provide our advanced biliary endoscopist with additional information. The risks and benefits of EGD with possible biopsies were discussed with the patient who  agrees to proceed.  --Stopping Salmon Lovenox in anticipation of EGD with biopsies in am.   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 Per admitting team he is scheduled to undergo EVAR on 2/10 however will need to be re-evaluated for medial clearance prior to undergoing this given his deteriorating status. Will need to discuss with [facility] prior to discharge  # history of adenomatous colon polyps. Surveillance colonoscopy due May 2025  # Short segment Barrett's esophagus. Surveillance EGD was due in May 2025 but he has had an EGDs since that recall date was set so the date might end of being changed  # Additional medical history listed below.   HISTORY OF PRESENT ILLNESS                                                                                                                         Chief Complaint:  weight loss.   [name] is a 76 y.o. 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  See PMH for any additional medical problems.    We were asked about an EUS to evaluate pancreatic lesion. Patient has had chronic weight loss. He is currently admitted with weight loss. He has been slowly losing weight for a long time. In fact we evaluated him for this in the hospital in May 2022 and did an EGD and colonoscopy . 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 However, there was some concern about abnormal appearing ( adenomatous appearing) at ampulla seen on EGD. Area couldn't be biopsied due to its position. Additionally he had some abnormal findings of pancreas on MRI though it was a degraded study. We thought findings represented microcytic changes which could be IPMN  or chronic pancreatitis. Pancreatic malignancy felt to be unlikely but plan was to ask our advanced biliary endoscopist to review. He missed his hospital follow up visit.   Patient was readmitted in late September 2022 with a GI bleed. CTA positive for activity in duodenal bulb.  Emergent EGD remarkable for duodenal dieulafoy lesion which was treated with good results. We have not seen patient since. Also, large adenoma found at ampulla, not biopsied due to bleeding.   [name] admitted with ongoing weight loss. He complains of fatigue. He has no appetite. No abdominal pain. No nausea / vomiting. He says that his stools are black. He takes iron but says they were black before starting iron.   ENDOSCOPIC EVALUATIONS     May 2022 EGD and colonoscopy for weight loss EGD - Normal esophagus. - Gastric erosions with no bleeding and no stigmata of recent bleeding. Biopsied. - Mucosal changes in the duodenum. Colonoscopy  -Four 4 to 8 mm polyps in the proximal transverse colon and in the ascending colon, removed with a cold snare. Resected and retrieved. - One 5 mm polyp in the proximal sigmoid colon, removed with a cold snare. Resected and retrieved. - The examination was otherwise normal on direct and retroflexion views FINAL MICROSCOPIC DIAGNOSIS:   A. STOMACH, RANDOM, BIOPSY:  - Mild chronic gastritis with intestinal metaplasia.  - Warthin-Starry negative for Helicobacter pylori.  - No dysplasia or carcinoma.   B. COLON, MULTIPLE, POLYPECTOMY:  - Tubular adenoma (s).  - No high-grade dysplasia or carcinoma.   C. COLON, SIGMOID, POLYPECTOMY:  - Colonic mucosa with small benign lymphoid aggregate.  - No adenomatous change or carcinoma.   Sept 2022 EGD for UGI bleeding - Z-line regular. - Old blood / clot in the entire stomach which was lavaged - Normal stomach otherwise - Actively bleeding dieulafoy lesion in the duodenal bulb treated with APC and hemostasis clips x 2 with good  result - Abnormal appearing ampulla concerning for adenomatous change. Biopsies not taken  IMAGING:  MR ABDOMEN MRCP WO CONTRAST  Result Date: 07/24/2021 CLINICAL DATA:  Unintentional weight loss with decreased appetite. 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 EXAM: MRI ABDOMEN WITHOUT CONTRAST  (INCLUDING MRCP) TECHNIQUE: Multiplanar multisequence MR imaging of the abdomen was performed. Heavily T2-weighted images of the biliary and pancreatic ducts were obtained, and three-dimensional MRCP images were rendered by post processing. COMPARISON:  Abdominopelvic CTA 06/19/2021 and 03/06/2021. Abdominal MRI 11/08/2020. FINDINGS: Despite efforts by the technologist and patient, moderate motion artifact is present on  today's exam and could not be eliminated. This reduces exam sensitivity and specificity. Patient was unable to complete the examination. The MRCP images are nondiagnostic. Lower chest: The visualized lower chest appears unremarkable. There is chronic elevation of the right hemidiaphragm. Hepatobiliary: No focal hepatic abnormality identified. Gallstones are again noted without evidence of gallbladder wall thickening or significant biliary dilatation. The MRCP images are nondiagnostic due to motion. Pancreas: Pancreatic assessment limited due to motion. No pancreatic ductal dilatation or acute abnormality identified. Tiny foci of T2 hyperintensity within the pancreatic head are similar to previous MRI. Spleen: Normal in size without focal abnormality. Adrenals/Urinary Tract: Both adrenal glands appear normal. Bilateral renal cysts are again noted. Previously noted T1 hyperintense lesion in the upper pole the left kidney has decreased in size. No hydronephrosis. The bladder is partially imaged on the coronal images and appears moderately distended. Stomach/Bowel: Limited assessment. No evidence of significant bowel distension or acute inflammation. Vascular/Lymphatic: There are no enlarged abdominal lymph  nodes. Fusiform aneurysmal dilatation of the abdominal aorta is again noted with associated irregular mural thrombus. The aorta measures up to 6.6 x 5.7 cm transverse on image 34/4 (compared with 6.2 x 5.5 cm on most recent CT). Measured in the coronal plane, the aorta measures 5.9 cm, which is unchanged from the prior CT. This aneurysm has been shown to be gradually enlarging compared with older prior studies. No definite signs of acute rupture. Other: No significant ascites. Musculoskeletal: No acute or significant osseous findings. Multilevel spondylosis. IMPRESSION: 1. Study is significantly limited by motion artifact. The MRCP images are nondiagnostic, and there is limited assessment of the pancreas. 2. Cholelithiasis without evidence of cholecystitis or biliary dilatation. 3. Tiny T2 hyperintense lesions within the pancreatic head are similar to previous MRI, but suboptimally evaluated due to motion. If determine to require additional follow-up clinically, this would be best performed with CT in this patient due to the limitations of MRI. 4. Slowly enlarging abdominal aortic aneurysm as evaluated on CTA last month. Vascular surgery consultation recommended due to increased risk of rupture for AAA >5.5 cm. This recommendation follows ACR consensus. This recommendation follows ACR consensus guidelines: White Paper of the ACR Incidental Findings Committee II on Vascular Findings. J Am Coll Radiol 2013; 10:789-794. Electronically Signed   By: Richardean Sale M.D.   On: 07/24/2021 13:30   MR 3D Recon At Scanner  Result Date: 07/24/2021 CLINICAL DATA:  Unintentional weight loss with decreased appetite. 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 EXAM: MRI ABDOMEN WITHOUT CONTRAST  (INCLUDING MRCP) TECHNIQUE: Multiplanar multisequence MR imaging of the abdomen was performed. Heavily T2-weighted images of the biliary and pancreatic ducts were obtained, and three-dimensional MRCP images were rendered by post processing.  COMPARISON:  Abdominopelvic CTA 06/19/2021 and 03/06/2021. Abdominal MRI 11/08/2020. FINDINGS: Despite efforts by the technologist and patient, moderate motion artifact is present on today's exam and could not be eliminated. This reduces exam sensitivity and specificity. Patient was unable to complete the examination. The MRCP images are nondiagnostic. Lower chest: The visualized lower chest appears unremarkable. There is chronic elevation of the right hemidiaphragm. Hepatobiliary: No focal hepatic abnormality identified. Gallstones are again noted without evidence of gallbladder wall thickening or significant biliary dilatation. The MRCP images are nondiagnostic due to motion. Pancreas: Pancreatic assessment limited due to motion. No pancreatic ductal dilatation or acute abnormality identified. Tiny foci of T2 hyperintensity within the pancreatic head are similar to previous MRI. Spleen: Normal in size without focal abnormality. Adrenals/Urinary Tract: Both adrenal glands appear normal.  Bilateral renal cysts are again noted. Previously noted T1 hyperintense lesion in the upper pole the left kidney has decreased in size. No hydronephrosis. The bladder is partially imaged on the coronal images and appears moderately distended. Stomach/Bowel: Limited assessment. No evidence of significant bowel distension or acute inflammation. Vascular/Lymphatic: There are no enlarged abdominal lymph nodes. Fusiform aneurysmal dilatation of the abdominal aorta is again noted with associated irregular mural thrombus. The aorta measures up to 6.6 x 5.7 cm transverse on image 34/4 (compared with 6.2 x 5.5 cm on most recent CT). Measured in the coronal plane, the aorta measures 5.9 cm, which is unchanged from the prior CT. This aneurysm has been shown to be gradually enlarging compared with older prior studies. No definite signs of acute rupture. Other: No significant ascites. Musculoskeletal: No acute or significant osseous findings.  Multilevel spondylosis. IMPRESSION: 1. Study is significantly limited by motion artifact. The MRCP images are nondiagnostic, and there is limited assessment of the pancreas. 2. Cholelithiasis without evidence of cholecystitis or biliary dilatation. 3. Tiny T2 hyperintense lesions within the pancreatic head are similar to previous MRI, but suboptimally evaluated due to motion. If determine to require additional follow-up clinically, this would be best performed with CT in this patient due to the limitations of MRI. 4. Slowly enlarging abdominal aortic aneurysm as evaluated on CTA last month. Vascular surgery consultation recommended due to increased risk of rupture for AAA >5.5 cm. This recommendation follows ACR consensus. This recommendation follows ACR consensus guidelines: White Paper of the ACR Incidental Findings Committee II on Vascular Findings. J Am Coll Radiol 2013; 10:789-794. Electronically Signed   By: Richardean Sale M.D.   On: 07/24/2021 13:30       Past Medical History:  Diagnosis Date   AAA (abdominal aortic aneurysm)    followed by vasc surgery   Alcohol abuse    Barretts esophagus    bx distal esophagus 2011 neg    Bladder outlet obstruction 04/02/2018   CAD in native artery    a. s/p stent to prox RCA and mid Cx.   Cataract    Cholelithiasis    Chronic osteomyelitis involving ankle and foot, right ([place]) 07/13/2018   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   2006 - 3 adenomas largest 12 mm  2008 - no polyps 2013-  2 mm cecal polyp, adenoma, routine repeat colonoscopy 02/2017 approx 05/14/2017 18 polyps max 12 mm ALL adenomas recall 2019 [name], MD, [facility]      Dieulafoy lesion of duodenum    Diverticulosis of colon    DJD (degenerative joint disease)    left hip   Full dentures    upper and lower   GERD (gastroesophageal reflux disease)    diet controlled    Gout    feet   Hiatal hernia    2 cm noted on upper endos 2011    Hx of adenomatous colonic polyps     Hyperlipidemia    Hypertension    Internal hemorrhoid    Lung cancer ([place])    squamous cell (T2N0MX), 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 pt was on Plavix x 3 years then transitioned to ASA, no problems since   Non-small cell carcinoma of right lung, stage 1 ([place]) 01/30/2016   Renal failure, acute ([facility])    hx of 2/2 medication, resolved   Tobacco abuse    smoker .5 ppd x 55 yrs  UTI (lower urinary tract infection)     Past Surgical History:  Procedure Laterality Date   BIOPSY  11/12/2020   Procedure: BIOPSY;  Surgeon: Doran Stabler, MD;  Location: [place];  Service: Gastroenterology;;   cataract surgery Bilateral 2018   removal of cataracts   COLONOSCOPY     hx polyps - [name] 02/2012   COLONOSCOPY WITH PROPOFOL N/A 11/12/2020   Procedure: COLONOSCOPY WITH PROPOFOL;  Surgeon: Doran Stabler, MD;  Location: [place];  Service: Gastroenterology;  Laterality: N/A;   CORONARY ANGIOPLASTY WITH STENT PLACEMENT     ESOPHAGOGASTRODUODENOSCOPY (EGD) WITH PROPOFOL N/A 11/12/2020   Procedure: ESOPHAGOGASTRODUODENOSCOPY (EGD) WITH PROPOFOL;  Surgeon: Doran Stabler, MD;  Location: [place];  Service: Gastroenterology;  Laterality: N/A;   ESOPHAGOGASTRODUODENOSCOPY (EGD) WITH PROPOFOL N/A 03/06/2021   Procedure: ESOPHAGOGASTRODUODENOSCOPY (EGD) WITH PROPOFOL;  Surgeon: Yetta Flock, MD;  Location: [facility];  Service: Gastroenterology;  Laterality: N/A;   flexible cystourethroscopy  05/24/06   HEMOSTASIS CLIP PLACEMENT  03/06/2021   Procedure: HEMOSTASIS CLIP PLACEMENT;  Surgeon: Yetta Flock, MD;  Location: [facility];  Service: Gastroenterology;;   HOT HEMOSTASIS N/A 03/06/2021   Procedure: HOT HEMOSTASIS (ARGON PLASMA COAGULATION/BICAP);  Surgeon: Yetta Flock, MD;  Location: [facility];  Service: Gastroenterology;  Laterality: N/A;   INGUINAL HERNIA REPAIR  2005   LUNG LOBECTOMY  Right 11/07   RUL   MULTIPLE TOOTH EXTRACTIONS     POLYPECTOMY  11/12/2020   Procedure: POLYPECTOMY;  Surgeon: Doran Stabler, MD;  [facility];  Service: Gastroenterology;;   TRANSURETHRAL RESECTION OF PROSTATE N/A 05/02/2021   Procedure: TRANSURETHRAL RESECTION OF THE PROSTATE (TURP);  Surgeon: Lucas Mallow, MD;  Location: [facility];  Service: Urology;  Laterality: N/A;   UPPER GASTROINTESTINAL ENDOSCOPY      Prior to Admission medications   Medication Sig Start Date End Date Taking? Authorizing Provider  acetaminophen (TYLENOL 8 HOUR) 650 MG CR tablet Take 1 tablet (650 mg total) by mouth every 8 (eight) hours as needed for pain or fever. 06/19/21  Yes Nanavati, Ankit, MD  albuterol (VENTOLIN HFA) 108 (90 Base) MCG/ACT inhaler INHALE 1-2 PUFFS BY MOUTH EVERY 6 HOURS AS NEEDED FOR WHEEZE OR SHORTNESS OF BREATH Patient taking differently: Inhale 1-2 puffs into the lungs every 6 (six) hours as needed for wheezing or shortness of breath. 07/12/21  Yes Axel Filler, MD  allopurinol (ZYLOPRIM) 100 MG tablet Take 100 mg by mouth daily.   Yes [provider]  amLODipine (NORVASC) 10 MG tablet Take 10 mg by mouth daily.   Yes [provider]  escitalopram (LEXAPRO) 5 MG tablet Take 1 tablet (5 mg total) by mouth daily. 07/11/21 01/07/22 Yes Rick Duff, MD  Ferrous Sulfate (IRON PO) Take 1 tablet by mouth daily.   Yes [provider]  HYDROcodone-acetaminophen (NORCO) 7.5-325 MG tablet Take 1 tablet by mouth 2 (two) times daily as needed for moderate pain. 07/13/21  Yes Axel Filler, MD  Multiple Vitamin (MULTIVITAMIN WITH MINERALS) TABS tablet Take 1 tablet by mouth daily.   Yes [provider]  Pancrelipase, Lip-Prot-Amyl, (ZENPEP) 20000-63000 units CPEP Take 3 capsules by mouth 3 (three) times daily before meals. 06/29/21  Yes Axel Filler, MD  pantoprazole (PROTONIX) 40 MG tablet Take 1 tablet (40 mg total) by mouth  daily. 04/04/21 07/23/21 Yes Axel Filler, MD  rosuvastatin (CRESTOR) 20 MG tablet Take 1 tablet (20 mg total) by  mouth daily. 06/14/21  Yes Axel Filler, MD  senna (SENOKOT) 8.6 MG TABS tablet Take 2 tablets (17.2 mg total) by mouth in the morning and at bedtime. Patient taking differently: Take 1 tablet by mouth daily as needed for mild constipation. 06/29/21  Yes Axel Filler, MD  tamsulosin (FLOMAX) 0.4 MG CAPS capsule Take 2 capsules (0.8 mg total) by mouth every evening. 11/27/20  Yes Axel Filler, MD  vitamin C (ASCORBIC ACID) 500 MG tablet Take 500 mg by mouth daily.   Yes [provider]  metoprolol succinate (TOPROL-XL) 100 MG 24 hr tablet Take 100 mg by mouth daily. Take with or immediately following a meal.    [provider]    Current Facility-Administered Medications  Medication Dose Route Frequency Provider Last Rate Last Admin   allopurinol (ZYLOPRIM) tablet 100 mg  100 mg Oral Daily Christian, Rylee, MD   100 mg at 07/24/21 0950   aspirin EC tablet 81 mg  81 mg Oral Daily Christian, Rylee, MD   81 mg at 07/24/21 0950   enoxaparin (LOVENOX) injection 40 mg  40 mg Subcutaneous Q24H Christian, Rylee, MD   40 mg at 07/23/21 1852   HYDROcodone-acetaminophen (NORCO) 7.5-325 MG per tablet 1 tablet  1 tablet Oral BID PRN Mitzi Hansen, MD   1 tablet at 07/24/21 9935   lactated ringers infusion   Intravenous Continuous Mitzi Hansen, MD 75 mL/hr at 07/24/21 0658 New Bag at 07/24/21 0658   lipase/protease/amylase (CREON) capsule 24,000 Units  24,000 Units Oral TID AC Christian, Rylee, MD   24,000 Units at 07/24/21 1353   rosuvastatin (CRESTOR) tablet 20 mg  20 mg Oral Daily Christian, Rylee, MD   20 mg at 07/24/21 0950   tamsulosin (FLOMAX) capsule 0.8 mg  0.8 mg Oral QPM Christian, Rylee, MD   0.8 mg at 07/23/21 1851    Allergies as of 07/23/2021 - Review Complete 07/23/2021  Allergen Reaction Noted   Candesartan  cilexetil-hctz Other (See Comments)    Hydrochlorothiazide Other (See Comments)    Shellfish allergy Other (See Comments) 03/24/2013    Family History  Problem Relation Age of Onset   Diabetes Mother    Hypertension Sister    Heart disease Sister    Diabetes Sister    Arthritis Brother    Gout Brother    Colon cancer Neg Hx    Esophageal cancer Neg Hx    Rectal cancer Neg Hx    Stomach cancer Neg Hx    Colon polyps Neg Hx     Social History   Socioeconomic History   Marital status: Single    Spouse name: Not on file   Number of children: 0   Years of education: Not on file   Highest education level: Not on file  Occupational History   Not on file  Tobacco Use   Smoking status: Every Day    Packs/day: 0.50    Years: 55.00    Pack years: 27.50    Types: Cigarettes   Smokeless tobacco: Never  Vaping Use   Vaping Use: Never used  Substance and Sexual Activity   Alcohol use: Not Currently   Drug use: No   Sexual activity: Not on file  Other Topics Concern   Not on file  Social History Narrative   Not on file   Social Determinants of Health   Financial Resource Strain: Not on file  Food Insecurity: Not on file  Transportation Needs: Not on file  Physical Activity: Not on file  Stress: Not on file  Social Connections: Not on file  Intimate Partner Violence: Not on file    Review of Systems: All systems reviewed and negative except where noted in HPI.   OBJECTIVE    Physical Exam: Vital signs in last 24 hours: Temp:  [98.2 F (36.8 C)-98.7 F (37.1 C)] 98.5 F (36.9 C) (02/07 1100) Pulse Rate:  [50-82] 82 (02/07 1100) Resp:  [16-17] 16 (02/07 1100) BP: (124-147)/(74-89) 134/89 (02/07 1100) SpO2:  [99 %-100 %] 99 % (02/07 1100) Weight:  [51.8 kg] 51.8 kg (02/07 0538)    General:  Alert cachectic male in NAD Psych:  Pleasant, cooperative. Normal mood and affect Eyes: Pupils equal, no icterus. Conjunctive pink Ears:  Normal auditory acuity Nose:  No deformity, discharge or lesions Neck:  Supple, no masses felt Lungs:  Clear to auscultation.  Heart:  Regular rate, regular rhythm. No lower extremity edema Abdomen:  Soft, nondistended, nontender, active bowel sounds, no masses felt Rectal :  Deferred Msk: Symmetrical without gross deformities.  Neurologic:  Alert, oriented, grossly normal neurologically Skin:  Intact without significant lesions.    Scheduled inpatient medications  allopurinol  100 mg Oral Daily   aspirin EC  81 mg Oral Daily   enoxaparin (LOVENOX) injection  40 mg Subcutaneous Q24H   lipase/protease/amylase  24,000 Units Oral TID AC   rosuvastatin  20 mg Oral Daily   tamsulosin  0.8 mg Oral QPM      Intake/Output from previous day: 02/06 0701 - 02/07 0700 In: 1104.3 [P.O.:60; I.V.:1044.3] Out: -  Intake/Output this shift: Total I/O In: 240 [P.O.:240] Out: 300 [Urine:300]   Lab Results: Recent Labs    07/23/21 1130 07/24/21 0343  WBC 3.6* 3.2*  HGB 12.4* 10.3*  HCT 40.8 33.4*  PLT 108* 95*   BMET Recent Labs    07/23/21 1130 07/24/21 0343  NA 138 139  K 4.6 3.8  CL 100 102  CO2 28 28  GLUCOSE 96 79  BUN 22 18  CREATININE 1.16 1.03  CALCIUM 9.8 9.5   LFTs Recent Labs    07/23/21 1130  PROT 6.9  ALBUMIN 3.1*  AST 29  ALT 13  ALKPHOS 49  BILITOT 1.6*   PT/INR Recent Labs    07/23/21 1130  LABPROT 14.2  INR 1.1   Hepatitis Panel No results for input(s): HEPBSAG, HCVAB, HEPAIGM, HEPBIGM in the last 72 hours.   . CBC Latest Ref Rng & Units 07/24/2021 07/23/2021 07/09/2021  WBC 4.0 - 10.5 K/uL 3.2(L) 3.6(L) 3.7  Hemoglobin 13.0 - 17.0 g/dL 10.3(L) 12.4(L) 12.1(L)  Hematocrit 39.0 - 52.0 % 33.4(L) 40.8 38.7  Platelets 150 - 400 K/uL 95(L) 108(L) 161    . CMP Latest Ref Rng & Units 07/24/2021 07/23/2021 07/09/2021  Glucose 70 - 99 mg/dL 79 96 92  BUN 8 - 23 mg/dL 18 22 15   Creatinine 0.61 - 1.24 mg/dL 1.03 1.16 1.25  Sodium 135 - 145 mmol/L 139 138 139  Potassium 3.5 - 5.1  mmol/L 3.8 4.6 4.3  Chloride 98 - 111 mmol/L 102 100 98  CO2 22 - 32 mmol/L 28 28 28   Calcium 8.9 - 10.3 mg/dL 9.5 9.8 10.0  Total Protein 6.5 - 8.1 g/dL - 6.9 -  Total Bilirubin 0.3 - 1.2 mg/dL - 1.6(H) -  Alkaline Phos 38 - 126 U/L - 49 -  AST 15 - 41 U/L - 29 -  ALT 0 - 44 U/L - 13 -  Principal Problem:   Severe malnutrition ([place])    Tye Savoy, NP-C @  07/24/2021, 3:19 PM  I have taken a history, reviewed the chart and examined the patient. I performed a substantive portion of this encounter, including complete performance of at least one of the key components, in conjunction with the APP. I agree with the APP's note, impression and recommendations  76 year old male with multiple medical problems as outlined above with ongoing unintentional weight loss and failure to thrive.  The patient denies problems with post prandial pain, nausea/vomiting, sitophobia, or diarrhea; he simply states he has not appetite or desire to eat.   The likelihood of a malignancy causing his weight loss seems very low considering the duration of his weight loss.  The patient does admit to feeling down and depressed but he is not sure if this is because of his declining function or if his declining function is because of depression. Would recommend maximizing nutritional support and consider appetite stimulants.  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  Will repeat EGD tomorrow and attempt to get tissue biopsy of the ampullary lesion.  Once these results are back, can discuss with advanced endoscopy service potential EUS to evaluate pancreatic abnormalities as well as potential ampullectomy if lesion is precancerous.    Emmary Culbreath E. Candis Schatz, MD [facility]  Gastroenterology

## 2021-07-24 NOTE — Consult Note (Addendum)
Referring Provider: Teaching Service PCP: Axel Filler, MD  Gastroenterologist: Silvano Rusk, MD Reason for consultation:  pancreatic lesion, need EUS?                 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 Admitted with ongoing weight loss. We were called to see if EUS may help with further evaluation of pancreatic lesion(s) seen on MRI.  These lesions were also seen on MRI in May 2022 when we saw him for hospital consult for weight loss. Lesion felt unlikely to represent malignancy at that time. . Repeat MRI this admission shows tiny T2 hyperintense lesions within the pancreatic head similar to previous MRI, but suboptimally evaluated due to motion artifact.  --Will discuss with out advanced biliary endoscopist whether EUS would be of value in further defining this lesion. We may require EUS anyway for ampullary lesion.  --Primary team has ordered CA 19-9 and CEA   # Slow unintentional weight loss of  37 pounds over the last year . He appears to have chronic pancreatitis, probably has some degree of malabsorption though says he is compliant with pancreatic enzymes. Mainly he has a loss of appetite.  Solids can be challenging to swallow.     --Nutritionist has seen. Recommending Ensure TID, regular diet.  --TSH 0.751 -SLP did bedside swallow evaluation. Overall his swallowing seems to be okay --Continue pancreatic enzymes   # Ampullary lesion. Abnormal ampullary tissue seen on EGD in May 2022 but unable to biopsy due to location. 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  --We would like to proceed with an EGD with biopsy of ampullary lesion in am. Tissue diagnosis will provide our advanced biliary endoscopist with additional information. The risks and benefits of EGD with possible biopsies were discussed with the patient who  agrees to proceed.  --Stopping [facility] Lovenox in anticipation of EGD with biopsies in am.   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 Per admitting team he is scheduled to undergo EVAR on 2/10 however will need to be re-evaluated for medial clearance prior to undergoing this given his deteriorating status. Will need to discuss with [facility] prior to discharge  # history of adenomatous colon polyps. Surveillance colonoscopy due May 2025  # Short segment Barrett's esophagus. Surveillance EGD was due in May 2025 but he has had an EGDs since that recall date was set so the date might end of being changed  # Additional medical history listed below.   HISTORY OF PRESENT ILLNESS                                                                                                                         Chief Complaint:  weight loss.   [name] is a 76 y.o. 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  See [facility] for any additional medical problems.    We were asked about an EUS to evaluate pancreatic lesion. Patient has had chronic weight loss. He is currently admitted with weight loss. He has been slowly losing weight for a long time. In fact we evaluated him for this in the hospital in May 2022 and did an EGD and colonoscopy . 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 However, there was some concern about abnormal appearing ( adenomatous appearing) at ampulla seen on EGD. Area couldn't be biopsied due to its position. Additionally he had some abnormal findings of pancreas on MRI though it was a degraded study. We thought findings represented microcytic changes which could be IPMN  or chronic pancreatitis. Pancreatic malignancy felt to be unlikely but plan was to ask our advanced biliary endoscopist to review. He missed his hospital follow up visit.   Patient was readmitted in late September 2022 with a GI bleed. CTA positive for activity in duodenal bulb.  Emergent EGD remarkable for duodenal dieulafoy lesion which was treated with good results. We have not seen patient since. Also, large adenoma found at ampulla, not biopsied due to bleeding.   [name] admitted with ongoing weight loss. He complains of fatigue. He has no appetite. No abdominal pain. No nausea / vomiting. He says that his stools are black. He takes iron but says they were black before starting iron.   ENDOSCOPIC EVALUATIONS     May 2022 EGD and colonoscopy for weight loss EGD - Normal esophagus. - Gastric erosions with no bleeding and no stigmata of recent bleeding. Biopsied. - Mucosal changes in the duodenum. Colonoscopy  -Four 4 to 8 mm polyps in the proximal transverse colon and in the ascending colon, removed with a cold snare. Resected and retrieved. - One 5 mm polyp in the proximal sigmoid colon, removed with a cold snare. Resected and retrieved. - The examination was otherwise normal on direct and retroflexion views FINAL MICROSCOPIC DIAGNOSIS:   A. STOMACH, RANDOM, BIOPSY:  - Mild chronic gastritis with intestinal metaplasia.  - Warthin-Starry negative for Helicobacter pylori.  - No dysplasia or carcinoma.   B. COLON, MULTIPLE, POLYPECTOMY:  - Tubular adenoma (s).  - No high-grade dysplasia or carcinoma.   C. COLON, SIGMOID, POLYPECTOMY:  - Colonic mucosa with small benign lymphoid aggregate.  - No adenomatous change or carcinoma.   Sept 2022 EGD for UGI bleeding - Z-line regular. - Old blood / clot in the entire stomach which was lavaged - Normal stomach otherwise - Actively bleeding dieulafoy lesion in the duodenal bulb treated with APC and hemostasis clips x 2 with good  result - Abnormal appearing ampulla concerning for adenomatous change. Biopsies not taken  IMAGING:  MR ABDOMEN MRCP WO CONTRAST  Result Date: 07/24/2021 CLINICAL DATA:  Unintentional weight loss with decreased appetite. 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 EXAM: MRI ABDOMEN WITHOUT CONTRAST  (INCLUDING MRCP) TECHNIQUE: Multiplanar multisequence MR imaging of the abdomen was performed. Heavily T2-weighted images of the biliary and pancreatic ducts were obtained, and three-dimensional MRCP images were rendered by post processing. COMPARISON:  Abdominopelvic CTA 06/19/2021 and 03/06/2021. Abdominal MRI 11/08/2020. FINDINGS: Despite efforts by the technologist and patient, moderate motion artifact is present on  today's exam and could not be eliminated. This reduces exam sensitivity and specificity. Patient was unable to complete the examination. The MRCP images are nondiagnostic. Lower chest: The visualized lower chest appears unremarkable. There is chronic elevation of the right hemidiaphragm. Hepatobiliary: No focal hepatic abnormality identified. Gallstones are again noted without evidence of gallbladder wall thickening or significant biliary dilatation. The MRCP images are nondiagnostic due to motion. Pancreas: Pancreatic assessment limited due to motion. No pancreatic ductal dilatation or acute abnormality identified. Tiny foci of T2 hyperintensity within the pancreatic head are similar to previous MRI. Spleen: Normal in size without focal abnormality. Adrenals/Urinary Tract: Both adrenal glands appear normal. Bilateral renal cysts are again noted. Previously noted T1 hyperintense lesion in the upper pole the left kidney has decreased in size. No hydronephrosis. The bladder is partially imaged on the coronal images and appears moderately distended. Stomach/Bowel: Limited assessment. No evidence of significant bowel distension or acute inflammation. Vascular/Lymphatic: There are no enlarged abdominal lymph  nodes. Fusiform aneurysmal dilatation of the abdominal aorta is again noted with associated irregular mural thrombus. The aorta measures up to 6.6 x 5.7 cm transverse on image 34/4 (compared with 6.2 x 5.5 cm on most recent CT). Measured in the coronal plane, the aorta measures 5.9 cm, which is unchanged from the prior CT. This aneurysm has been shown to be gradually enlarging compared with older prior studies. No definite signs of acute rupture. Other: No significant ascites. Musculoskeletal: No acute or significant osseous findings. Multilevel spondylosis. IMPRESSION: 1. Study is significantly limited by motion artifact. The MRCP images are nondiagnostic, and there is limited assessment of the pancreas. 2. Cholelithiasis without evidence of cholecystitis or biliary dilatation. 3. Tiny T2 hyperintense lesions within the pancreatic head are similar to previous MRI, but suboptimally evaluated due to motion. If determine to require additional follow-up clinically, this would be best performed with CT in this patient due to the limitations of MRI. 4. Slowly enlarging abdominal aortic aneurysm as evaluated on CTA last month. Vascular surgery consultation recommended due to increased risk of rupture for AAA >5.5 cm. This recommendation follows ACR consensus. This recommendation follows ACR consensus guidelines: White Paper of the ACR Incidental Findings Committee II on Vascular Findings. J Am Coll Radiol 2013; 10:789-794. Electronically Signed   By: Richardean Sale M.D.   On: 07/24/2021 13:30   MR 3D Recon At Scanner  Result Date: 07/24/2021 CLINICAL DATA:  Unintentional weight loss with decreased appetite. 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 EXAM: MRI ABDOMEN WITHOUT CONTRAST  (INCLUDING MRCP) TECHNIQUE: Multiplanar multisequence MR imaging of the abdomen was performed. Heavily T2-weighted images of the biliary and pancreatic ducts were obtained, and three-dimensional MRCP images were rendered by post processing.  COMPARISON:  Abdominopelvic CTA 06/19/2021 and 03/06/2021. Abdominal MRI 11/08/2020. FINDINGS: Despite efforts by the technologist and patient, moderate motion artifact is present on today's exam and could not be eliminated. This reduces exam sensitivity and specificity. Patient was unable to complete the examination. The MRCP images are nondiagnostic. Lower chest: The visualized lower chest appears unremarkable. There is chronic elevation of the right hemidiaphragm. Hepatobiliary: No focal hepatic abnormality identified. Gallstones are again noted without evidence of gallbladder wall thickening or significant biliary dilatation. The MRCP images are nondiagnostic due to motion. Pancreas: Pancreatic assessment limited due to motion. No pancreatic ductal dilatation or acute abnormality identified. Tiny foci of T2 hyperintensity within the pancreatic head are similar to previous MRI. Spleen: Normal in size without focal abnormality. Adrenals/Urinary Tract: Both adrenal glands appear normal.  Bilateral renal cysts are again noted. Previously noted T1 hyperintense lesion in the upper pole the left kidney has decreased in size. No hydronephrosis. The bladder is partially imaged on the coronal images and appears moderately distended. Stomach/Bowel: Limited assessment. No evidence of significant bowel distension or acute inflammation. Vascular/Lymphatic: There are no enlarged abdominal lymph nodes. Fusiform aneurysmal dilatation of the abdominal aorta is again noted with associated irregular mural thrombus. The aorta measures up to 6.6 x 5.7 cm transverse on image 34/4 (compared with 6.2 x 5.5 cm on most recent CT). Measured in the coronal plane, the aorta measures 5.9 cm, which is unchanged from the prior CT. This aneurysm has been shown to be gradually enlarging compared with older prior studies. No definite signs of acute rupture. Other: No significant ascites. Musculoskeletal: No acute or significant osseous findings.  Multilevel spondylosis. IMPRESSION: 1. Study is significantly limited by motion artifact. The MRCP images are nondiagnostic, and there is limited assessment of the pancreas. 2. Cholelithiasis without evidence of cholecystitis or biliary dilatation. 3. Tiny T2 hyperintense lesions within the pancreatic head are similar to previous MRI, but suboptimally evaluated due to motion. If determine to require additional follow-up clinically, this would be best performed with CT in this patient due to the limitations of MRI. 4. Slowly enlarging abdominal aortic aneurysm as evaluated on CTA last month. Vascular surgery consultation recommended due to increased risk of rupture for AAA >5.5 cm. This recommendation follows ACR consensus. This recommendation follows ACR consensus guidelines: White Paper of the ACR Incidental Findings Committee II on Vascular Findings. J Am Coll Radiol 2013; 10:789-794. Electronically Signed   By: Richardean Sale M.D.   On: 07/24/2021 13:30       Past Medical History:  Diagnosis Date   AAA (abdominal aortic aneurysm)    followed by vasc surgery   Alcohol abuse    Barretts esophagus    bx distal esophagus 2011 neg    Bladder outlet obstruction 04/02/2018   CAD in native artery    a. s/p stent to prox RCA and mid Cx.   Cataract    Cholelithiasis    Chronic osteomyelitis involving ankle and foot, right ([place]) 07/13/2018   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   2006 - 3 adenomas largest 12 mm  2008 - no polyps 2013-  2 mm cecal polyp, adenoma, routine repeat colonoscopy 02/2017 approx 05/14/2017 18 polyps max 12 mm ALL adenomas recall 2019 [name], MD, [facility]      Dieulafoy lesion of duodenum    Diverticulosis of colon    DJD (degenerative joint disease)    left hip   Full dentures    upper and lower   GERD (gastroesophageal reflux disease)    diet controlled    Gout    feet   Hiatal hernia    2 cm noted on upper endos 2011    Hx of adenomatous colonic polyps     Hyperlipidemia    Hypertension    Internal hemorrhoid    Lung cancer ([place])    squamous cell (T2N0MX), 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 pt was on Plavix x 3 years then transitioned to ASA, no problems since   Non-small cell carcinoma of right lung, stage 1 ([place]) 01/30/2016   Renal failure, acute ([facility])    hx of 2/2 medication, resolved   Tobacco abuse    smoker .5 ppd x 55 yrs  UTI (lower urinary tract infection)     Past Surgical History:  Procedure Laterality Date   BIOPSY  11/12/2020   Procedure: BIOPSY;  Surgeon: Doran Stabler, MD;  Location: [facility];  Service: Gastroenterology;;   cataract surgery Bilateral 2018   removal of cataracts   COLONOSCOPY     hx polyps - [name] 02/2012   COLONOSCOPY WITH PROPOFOL N/A 11/12/2020   Procedure: COLONOSCOPY WITH PROPOFOL;  Surgeon: Doran Stabler, MD;  Location: [facility];  Service: Gastroenterology;  Laterality: N/A;   CORONARY ANGIOPLASTY WITH STENT PLACEMENT     ESOPHAGOGASTRODUODENOSCOPY (EGD) WITH PROPOFOL N/A 11/12/2020   Procedure: ESOPHAGOGASTRODUODENOSCOPY (EGD) WITH PROPOFOL;  Surgeon: Doran Stabler, MD;  Location: [facility];  Service: Gastroenterology;  Laterality: N/A;   ESOPHAGOGASTRODUODENOSCOPY (EGD) WITH PROPOFOL N/A 03/06/2021   Procedure: ESOPHAGOGASTRODUODENOSCOPY (EGD) WITH PROPOFOL;  Surgeon: Yetta Flock, MD;  Location: [facility];  Service: Gastroenterology;  Laterality: N/A;   flexible cystourethroscopy  05/24/06   HEMOSTASIS CLIP PLACEMENT  03/06/2021   Procedure: HEMOSTASIS CLIP PLACEMENT;  Surgeon: Yetta Flock, MD;  Location: [facility];  Service: Gastroenterology;;   HOT HEMOSTASIS N/A 03/06/2021   Procedure: HOT HEMOSTASIS (ARGON PLASMA COAGULATION/BICAP);  Surgeon: Yetta Flock, MD;  Location: [facility];  Service: Gastroenterology;  Laterality: N/A;   INGUINAL HERNIA REPAIR  2005   LUNG LOBECTOMY  Right 11/07   RUL   MULTIPLE TOOTH EXTRACTIONS     POLYPECTOMY  11/12/2020   Procedure: POLYPECTOMY;  Surgeon: Doran Stabler, MD;  Location: [facility];  Service: Gastroenterology;;   TRANSURETHRAL RESECTION OF PROSTATE N/A 05/02/2021   Procedure: TRANSURETHRAL RESECTION OF THE PROSTATE (TURP);  Surgeon: Lucas Mallow, MD;  Location: [facility];  Service: Urology;  Laterality: N/A;   UPPER GASTROINTESTINAL ENDOSCOPY      Prior to Admission medications   Medication Sig Start Date End Date Taking? Authorizing Provider  acetaminophen (TYLENOL 8 HOUR) 650 MG CR tablet Take 1 tablet (650 mg total) by mouth every 8 (eight) hours as needed for pain or fever. 06/19/21  Yes Nanavati, Ankit, MD  albuterol (VENTOLIN HFA) 108 (90 Base) MCG/ACT inhaler INHALE 1-2 PUFFS BY MOUTH EVERY 6 HOURS AS NEEDED FOR WHEEZE OR SHORTNESS OF BREATH Patient taking differently: Inhale 1-2 puffs into the lungs every 6 (six) hours as needed for wheezing or shortness of breath. 07/12/21  Yes Axel Filler, MD  allopurinol (ZYLOPRIM) 100 MG tablet Take 100 mg by mouth daily.   Yes [provider]  amLODipine (NORVASC) 10 MG tablet Take 10 mg by mouth daily.   Yes [provider]  escitalopram (LEXAPRO) 5 MG tablet Take 1 tablet (5 mg total) by mouth daily. 07/11/21 01/07/22 Yes Rick Duff, MD  Ferrous Sulfate (IRON PO) Take 1 tablet by mouth daily.   Yes [provider]  HYDROcodone-acetaminophen (NORCO) 7.5-325 MG tablet Take 1 tablet by mouth 2 (two) times daily as needed for moderate pain. 07/13/21  Yes Axel Filler, MD  Multiple Vitamin (MULTIVITAMIN WITH MINERALS) TABS tablet Take 1 tablet by mouth daily.   Yes [provider]  Pancrelipase, Lip-Prot-Amyl, (ZENPEP) 20000-63000 units CPEP Take 3 capsules by mouth 3 (three) times daily before meals. 06/29/21  Yes Axel Filler, MD  pantoprazole (PROTONIX) 40 MG tablet Take 1 tablet (40 mg total) by mouth  daily. 04/04/21 07/23/21 Yes Axel Filler, MD  rosuvastatin (CRESTOR) 20 MG tablet Take 1 tablet (20 mg total) by  mouth daily. 06/14/21  Yes Axel Filler, MD  senna (SENOKOT) 8.6 MG TABS tablet Take 2 tablets (17.2 mg total) by mouth in the morning and at bedtime. Patient taking differently: Take 1 tablet by mouth daily as needed for mild constipation. 06/29/21  Yes Axel Filler, MD  tamsulosin (FLOMAX) 0.4 MG CAPS capsule Take 2 capsules (0.8 mg total) by mouth every evening. 11/27/20  Yes Axel Filler, MD  vitamin C (ASCORBIC ACID) 500 MG tablet Take 500 mg by mouth daily.   Yes [provider]  metoprolol succinate (TOPROL-XL) 100 MG 24 hr tablet Take 100 mg by mouth daily. Take with or immediately following a meal.    [provider]    Current Facility-Administered Medications  Medication Dose Route Frequency Provider Last Rate Last Admin   allopurinol (ZYLOPRIM) tablet 100 mg  100 mg Oral Daily Christian, Rylee, MD   100 mg at 07/24/21 0950   aspirin EC tablet 81 mg  81 mg Oral Daily Christian, Rylee, MD   81 mg at 07/24/21 0950   enoxaparin (LOVENOX) injection 40 mg  40 mg Subcutaneous Q24H Christian, Rylee, MD   40 mg at 07/23/21 1852   HYDROcodone-acetaminophen (NORCO) 7.5-325 MG per tablet 1 tablet  1 tablet Oral BID PRN Mitzi Hansen, MD   1 tablet at 07/24/21 8341   lactated ringers infusion   Intravenous Continuous Mitzi Hansen, MD 75 mL/hr at 07/24/21 0658 New Bag at 07/24/21 0658   lipase/protease/amylase (CREON) capsule 24,000 Units  24,000 Units Oral TID AC Christian, Rylee, MD   24,000 Units at 07/24/21 1353   rosuvastatin (CRESTOR) tablet 20 mg  20 mg Oral Daily Christian, Rylee, MD   20 mg at 07/24/21 0950   tamsulosin (FLOMAX) capsule 0.8 mg  0.8 mg Oral QPM Christian, Rylee, MD   0.8 mg at 07/23/21 1851    Allergies as of 07/23/2021 - Review Complete 07/23/2021  Allergen Reaction Noted   Candesartan  cilexetil-hctz Other (See Comments)    Hydrochlorothiazide Other (See Comments)    Shellfish allergy Other (See Comments) 03/24/2013    Family History  Problem Relation Age of Onset   Diabetes Mother    Hypertension Sister    Heart disease Sister    Diabetes Sister    Arthritis Brother    Gout Brother    Colon cancer Neg Hx    Esophageal cancer Neg Hx    Rectal cancer Neg Hx    Stomach cancer Neg Hx    Colon polyps Neg Hx     Social History   Socioeconomic History   Marital status: Single    Spouse name: Not on file   Number of children: 0   Years of education: Not on file   Highest education level: Not on file  Occupational History   Not on file  Tobacco Use   Smoking status: Every Day    Packs/day: 0.50    Years: 55.00    Pack years: 27.50    Types: Cigarettes   Smokeless tobacco: Never  Vaping Use   Vaping Use: Never used  Substance and Sexual Activity   Alcohol use: Not Currently   Drug use: No   Sexual activity: Not on file  Other Topics Concern   Not on file  Social History Narrative   Not on file   Social Determinants of Health   Financial Resource Strain: Not on file  Food Insecurity: Not on file  Transportation Needs: Not on file  Physical Activity: Not on file  Stress: Not on file  Social Connections: Not on file  Intimate Partner Violence: Not on file    Review of Systems: All systems reviewed and negative except where noted in HPI.   OBJECTIVE    Physical Exam: Vital signs in last 24 hours: Temp:  [98.2 F (36.8 C)-98.7 F (37.1 C)] 98.5 F (36.9 C) (02/07 1100) Pulse Rate:  [50-82] 82 (02/07 1100) Resp:  [16-17] 16 (02/07 1100) BP: (124-147)/(74-89) 134/89 (02/07 1100) SpO2:  [99 %-100 %] 99 % (02/07 1100) Weight:  [51.8 kg] 51.8 kg (02/07 0538)    General:  Alert cachectic male in NAD Psych:  Pleasant, cooperative. Normal mood and affect Eyes: Pupils equal, no icterus. Conjunctive pink Ears:  Normal auditory acuity Nose:  No deformity, discharge or lesions Neck:  Supple, no masses felt Lungs:  Clear to auscultation.  Heart:  Regular rate, regular rhythm. No lower extremity edema Abdomen:  Soft, nondistended, nontender, active bowel sounds, no masses felt Rectal :  Deferred Msk: Symmetrical without gross deformities.  Neurologic:  Alert, oriented, grossly normal neurologically Skin:  Intact without significant lesions.    Scheduled inpatient medications  allopurinol  100 mg Oral Daily   aspirin EC  81 mg Oral Daily   enoxaparin (LOVENOX) injection  40 mg Subcutaneous Q24H   lipase/protease/amylase  24,000 Units Oral TID AC   rosuvastatin  20 mg Oral Daily   tamsulosin  0.8 mg Oral QPM      Intake/Output from previous day: 02/06 0701 - 02/07 0700 In: 1104.3 [P.O.:60; I.V.:1044.3] Out: -  Intake/Output this shift: Total I/O In: 240 [P.O.:240] Out: 300 [Urine:300]   Lab Results: Recent Labs    07/23/21 1130 07/24/21 0343  WBC 3.6* 3.2*  HGB 12.4* 10.3*  HCT 40.8 33.4*  PLT 108* 95*   BMET Recent Labs    07/23/21 1130 07/24/21 0343  NA 138 139  K 4.6 3.8  CL 100 102  CO2 28 28  GLUCOSE 96 79  BUN 22 18  CREATININE 1.16 1.03  CALCIUM 9.8 9.5   LFTs Recent Labs    07/23/21 1130  PROT 6.9  ALBUMIN 3.1*  AST 29  ALT 13  ALKPHOS 49  BILITOT 1.6*   PT/INR Recent Labs    07/23/21 1130  LABPROT 14.2  INR 1.1   Hepatitis Panel No results for input(s): HEPBSAG, HCVAB, HEPAIGM, HEPBIGM in the last 72 hours.   . CBC Latest Ref Rng & Units 07/24/2021 07/23/2021 07/09/2021  WBC 4.0 - 10.5 K/uL 3.2(L) 3.6(L) 3.7  Hemoglobin 13.0 - 17.0 g/dL 10.3(L) 12.4(L) 12.1(L)  Hematocrit 39.0 - 52.0 % 33.4(L) 40.8 38.7  Platelets 150 - 400 K/uL 95(L) 108(L) 161    . CMP Latest Ref Rng & Units 07/24/2021 07/23/2021 07/09/2021  Glucose 70 - 99 mg/dL 79 96 92  BUN 8 - 23 mg/dL 18 22 15   Creatinine 0.61 - 1.24 mg/dL 1.03 1.16 1.25  Sodium 135 - 145 mmol/L 139 138 139  Potassium 3.5 - 5.1  mmol/L 3.8 4.6 4.3  Chloride 98 - 111 mmol/L 102 100 98  CO2 22 - 32 mmol/L 28 28 28   Calcium 8.9 - 10.3 mg/dL 9.5 9.8 10.0  Total Protein 6.5 - 8.1 g/dL - 6.9 -  Total Bilirubin 0.3 - 1.2 mg/dL - 1.6(H) -  Alkaline Phos 38 - 126 U/L - 49 -  AST 15 - 41 U/L - 29 -  ALT 0 - 44 U/L - 13 -  Principal Problem:   Severe malnutrition ([place])    Tye Savoy, NP-C @  07/24/2021, 3:19 PM  I have taken a history, reviewed the chart and examined the patient. I performed a substantive portion of this encounter, including complete performance of at least one of the key components, in conjunction with the APP. I agree with the APP's note, impression and recommendations  76 year old male with multiple medical problems as outlined above with ongoing unintentional weight loss and failure to thrive.  The patient denies problems with post prandial pain, nausea/vomiting, sitophobia, or diarrhea; he simply states he has not appetite or desire to eat.   The likelihood of a malignancy causing his weight loss seems very low considering the duration of his weight loss.  The patient does admit to feeling down and depressed but he is not sure if this is because of his declining function or if his declining function is because of depression. Would recommend maximizing nutritional support and consider appetite stimulants.  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  Will repeat EGD tomorrow and attempt to get tissue biopsy of the ampullary lesion.  Once these results are back, can discuss with advanced endoscopy service potential EUS to evaluate pancreatic abnormalities as well as potential ampullectomy if lesion is precancerous.    Adolphe Fortunato E. Candis Schatz, MD [facility]  Gastroenterology

## 2021-07-24 NOTE — Progress Notes (Signed)
Initial Nutrition Assessment  DOCUMENTATION CODES:  Severe malnutrition in context of social or environmental circumstances, Underweight  INTERVENTION:  Continue regular diet, encourage PO intake Ensure Enlive po TID, each supplement provides 350 kcal and 20 grams of protein. MVI with minerals  NUTRITION DIAGNOSIS:  Severe Malnutrition (in the context of social and environmental circumstances) related to poor appetite as evidenced by severe muscle depletion, severe fat depletion, percent weight loss (17.4% x 3 months).  GOAL:  Patient will meet greater than or equal to 90% of their needs, Weight gain  MONITOR:  PO intake, Supplement acceptance  REASON FOR ASSESSMENT:  Consult Assessment of nutrition requirement/status  ASSESSMENT:  76 year old male with hx of gout, HTN, HLD, barretts esophagus, tobacco abuse, EtOH abuse, full dentures, lung cancer, GERD, hx of MI, and CAD presented to ED from PCP office for declining functional status and unintentional weight loss  Pt resting in bed at the time of visit, RN about to administer medications. Pt reports that he had a little breakfast this AM, but did not eat lunch. States at home recently he has not been eating very often. Will sometimes eat fruit. Mostly just drinks ensures (3x/d). Significant fat and muscle deficits present on exam.   Noted a 17.4% weight loss in the last 3 months (11/16-2/7) which is severe.  Inquired about poor intake, pt states he doesn't feel like eating at home. Does not have a specific reason such as nausea or early satiety.   Pt agreeable to receiving ensure this admission. Encouraged pt to also ask for snacks from nourishment room anytime he is feeling hungry. Likes applesauce.   Provided with applesauce, ensure, and warm blankets prior to leaving room.   Average Meal Intake: 2/6: 5% intake x 1 recorded meal  Nutritionally Relevant Medications: Scheduled Meds:  lipase/protease/amylase  24,000 Units  Oral TID AC   rosuvastatin  20 mg Oral Daily   Continuous Infusions:  lactated ringers 75 mL/hr at 07/24/21 0658   Labs Reviewed  NUTRITION - FOCUSED PHYSICAL EXAM: Flowsheet Row Most Recent Value  Orbital Region Severe depletion  Upper Arm Region Severe depletion  Thoracic and Lumbar Region Severe depletion  Buccal Region Severe depletion  Temple Region Severe depletion  Clavicle Bone Region Severe depletion  Clavicle and Acromion Bone Region Severe depletion  Scapular Bone Region Severe depletion  Dorsal Hand Severe depletion  Patellar Region Severe depletion  Anterior Thigh Region Severe depletion  Posterior Calf Region Severe depletion  Edema (RD Assessment) None  Hair Reviewed  Eyes Reviewed  Mouth Reviewed  Skin Reviewed  Nails Reviewed    Diet Order:   Diet Order             Diet regular Room service appropriate? Yes; Fluid consistency: Thin  Diet effective now                   EDUCATION NEEDS:  No education needs have been identified at this time  Skin:  Skin Assessment: Reviewed RN Assessment  Last BM:  prior to admission  Height:  Ht Readings from Last 1 Encounters:  07/23/21 6\' 3"  (1.905 m)    Weight:  Wt Readings from Last 1 Encounters:  07/24/21 51.8 kg    Ideal Body Weight:  89.1 kg  BMI:  Body mass index is 14.27 kg/m.  Estimated Nutritional Needs:  Kcal:  1800-2000 kcal/d Protein:  90-100 g/d Fluid:  1.8-2 L/d   Ranell Patrick, RD, LDN Clinical Dietitian RD pager #  available in [facility]  After hours/weekend pager # available in [facility]

## 2021-07-24 NOTE — Evaluation (Signed)
Occupational Therapy Evaluation Patient Details Name: [name] MRN: [id number] DOB: 1945/08/18 Today's Date: 07/24/2021   History of Present Illness Pt is a 76 y/o male direct admit from Internal Medicine due to malnutrition and palpitable abdominal mass. CT pending. PMH includes HTN, AAA, A-fib, CAD, falls, pancreatic mass, GERD, and lung CA.   Clinical Impression   This 76 yo male admitted with above presents to acute OT with PLOF of be doing the least amount he had to due to weakness and loss of weight over last several months. He said he could do a wash up, get himself to/from bathroom with rollator, and drink liquids--otherwise he stayed in bed. He currently is at an overall setup-min A level and fatigues easily. He will continue to benefit from acute OT with follow up at SNF.      Recommendations for follow up therapy are one component of a multi-disciplinary discharge planning process, led by the attending physician.  Recommendations may be updated based on patient status, additional functional criteria and insurance authorization.   Follow Up Recommendations  Skilled nursing-short term rehab (<3 hours/day)    Assistance Recommended at Discharge Frequent or constant Supervision/Assistance  Patient can return home with the following A little help with walking and/or transfers;A little help with bathing/dressing/bathroom;Assistance with feeding;Assist for transportation;Direct supervision/assist for financial management;Direct supervision/assist for medications management;Assistance with cooking/housework    Functional Status Assessment  Patient has had a recent decline in their functional status and demonstrates the ability to make significant improvements in function in a reasonable and predictable amount of time.  Equipment Recommendations  Other (comment) (TBD next venue)       Precautions / Restrictions Precautions Precautions: Fall Restrictions Weight Bearing  Restrictions: No      Mobility Bed Mobility Overal bed mobility: Needs Assistance Bed Mobility: Supine to Sit, Sit to Supine     Supine to sit: Supervision          Transfers Overall transfer level: Needs assistance Equipment used: Rolling walker (2 wheels) Transfers: Sit to/from Stand Sit to Stand: Min assist           General transfer comment: VCs for safe hand placement      Balance Overall balance assessment: Needs assistance Sitting-balance support: No upper extremity supported, Feet supported Sitting balance-Leahy Scale: Good     Standing balance support: Bilateral upper extremity supported, Reliant on assistive device for balance Standing balance-Leahy Scale: Poor                             ADL either performed or assessed with clinical judgement   ADL Overall ADL's : Needs assistance/impaired Eating/Feeding: Independent;Sitting   Grooming: Set up;Sitting   Upper Body Bathing: Set up;Sitting   Lower Body Bathing: Minimal assistance;Sit to/from stand   Upper Body Dressing : Set up;Sitting   Lower Body Dressing: Minimal assistance;Sit to/from stand   Toilet Transfer: Minimal assistance;Ambulation;Rolling walker (2 wheels) Toilet Transfer Details (indicate cue type and reason): bed>window>recliner next to bed Toileting- Clothing Manipulation and Hygiene: Minimal assistance;Sit to/from stand               Vision Patient Visual Report: No change from baseline              Pertinent Vitals/Pain Pain Assessment Pain Assessment: No/denies pain      Hand Dominance  right   Extremity/Trunk Assessment Upper Extremity Assessment Upper Extremity Assessment: Overall WFL for tasks assessed  Communication Communication Communication: No difficulties   Cognition Arousal/Alertness: Awake/alert Behavior During Therapy: WFL for tasks assessed/performed Overall Cognitive Status: No family/caregiver present to determine  baseline cognitive functioning                                                  Home Living Family/patient expects to be discharged to:: Private residence Living Arrangements: Alone Available Help at Discharge: Family;Available PRN/intermittently Type of Home: Apartment Home Access: Stairs to enter Entrance Stairs-Number of Steps: 2 Entrance Stairs-Rails: Right Home Layout: One level     Bathroom Shower/Tub: Teacher, early years/pre: Standard Bathroom Accessibility: Yes   Home Equipment: Cane - single point;Toilet riser;Rollator (4 wheels)          Prior Functioning/Environment Prior Level of Function : History of Falls (last six months)             Mobility Comments: stays in bed often only ambulating to bathroom and back. ADLs Comments: sister assists with bathing and dressing. Drinks ensure and reports he has not been eating solid food.        OT Problem List: Decreased strength;Impaired balance (sitting and/or standing)      OT Treatment/Interventions: Self-care/ADL training;DME and/or AE instruction;Patient/family education;Balance training    OT Goals(Current goals can be found in the care plan section) Acute Rehab OT Goals Patient Stated Goal: to be able to eat again OT Goal Formulation: With patient Time For Goal Achievement: 08/07/21 Potential to Achieve Goals: Good  OT Frequency: Min 2X/week       AM-PAC OT "6 Clicks" Daily Activity     Outcome Measure Help from another person eating meals?: None Help from another person taking care of personal grooming?: A Little Help from another person toileting, which includes using toliet, bedpan, or urinal?: A Little Help from another person bathing (including washing, rinsing, drying)?: A Little Help from another person to put on and taking off regular upper body clothing?: A Little Help from another person to put on and taking off regular lower body clothing?: A Little 6 Click  Score: 19   End of Session Equipment Utilized During Treatment: Gait belt;Rolling walker (2 wheels)  Activity Tolerance: Patient tolerated treatment well Patient left: in bed;with call bell/phone within reach;with bed alarm set  OT Visit Diagnosis: Unsteadiness on feet (R26.81);Muscle weakness (generalized) (M62.81)                Time: 4332-9518 OT Time Calculation (min): 18 min Charges:  OT General Charges $OT Visit: 1 Visit OT Treatments $Self Care/Home Management : 8-22 mins  [name], OTR/L Acute [employer] Pager [phone] Office [phone]    Almon Register 07/24/2021, 10:13 AM

## 2021-07-24 NOTE — Progress Notes (Signed)
Mobility Specialist Progress Note:   07/24/21 1621  Mobility  Activity Ambulated with assistance in room  Level of Assistance Standby assist, set-up cues, supervision of patient - no hands on  Assistive Device Front wheel walker  Distance Ambulated (ft) 60 ft  Activity Response Tolerated well  $Mobility charge 1 Mobility   Pt received in bed and with encouragement agreed to ambulate in the room. No complaints of pain. Pt left in bed with call bell in reach and all needs met.    [facility] Public librarian Phone [phone]

## 2021-07-24 NOTE — Progress Notes (Signed)
Date: 07/24/2021  Patient name: [name]  Medical record number: [id number]  Date of birth: 1946/01/18   I have seen and evaluated Filbert Berthold and discussed their care with the Residency Team.  In brief, patient is a 76 year old male with a past medical history of AAA, A-fib, pancreatic mass, alcohol use disorder, Barrett's esophagus, bladder outlet obstruction, CAD, prostate cancer, hyperlipidemia, hypertension, gout, non-small cell lung cancer of right lung stage I who presented to the ED from [facility] with worsening weight loss over the last 12 months and deconditioning.  Patient was seen in [facility] yesterday by his PCP and at that time appeared dehydrated secondary to poor oral intake and food aversion.  Patient has lost approximately 40 pounds over the last year and had a fall at home recently.  Of note, patient does have a history of pancreatic mass noted on CTs last spring with a mildly elevated CA 19-9.  Patient states that he has no appetite and drinks 2-3 ensures a day but otherwise only occasionally eats applesauce.  He denies any issues with swallowing.  Patient does complain of worsening weakness and inability to ambulate secondary to not feeling well.  No chest pain, no shortness of breath, no palpitations, no lightheadedness, no syncope, no focal weakness, no tingling or numbness, no headache, no blurry vision, no nausea or vomiting, no diarrhea, no abdominal pain, no fevers or chills.  Today, patient states that he feels tired and has no appetite.  PMHx, Fam Hx, and/or Soc Hx : As per resident admit note  Vitals:   07/24/21 0540 07/24/21 1100  BP: (!) 147/89 134/89  Pulse: 72 82  Resp:  16  Temp: 98.2 F (36.8 C) 98.5 F (36.9 C)  SpO2: 100% 99%   General: Awake, alert, chronically ill-appearing CVS: Regular rate and rhythm, normal heart sounds Lungs: CTA bilaterally Abdomen: Soft, nontender, nondistended, no active bowel sounds Extremities: No edema noted, nontender to  palpation, significant muscular atrophy noted HEENT: Temporal wasting noted on exam, tongue appears dry Psych: Flat affect Skin: Warm and dry  Assessment and Plan: I have seen and evaluated the patient as outlined above. I agree with the formulated Assessment and Plan as detailed in the residents' note, with the following changes:   1.  Severe malnutrition secondary to unintentional weight loss: -Patient presented to the ED from [facility] for significant unintentional weight loss of 40 pounds over the last year with associated deconditioning and declining functional status.  Patient has temporal wasting and significant muscular atrophy consistent with his severe malnutrition.  Etiology behind this is uncertain at this time.  It is likely patient has an underlying malignancy that may be accounting for his weight loss and decreased appetite.  He does have a history of a pancreatic mass noted on imaging in May 2022.  Patient did have an EGD and colonoscopy done last year as well which showed no evidence of malignancy. -Patient started on IV fluids for maintenance here given decreased oral intake -Patient is status post MRI/MRCP but was unable to tolerate procedure and asked that it be stopped in the middle.  MRCP images are nondiagnostic.  He was noted to have tiny T2 hyperintense lesions within the pancreatic head similar to his prior imaging. -We will discuss case with GI for further recommendations including possible EUS for further evaluation -Patient noted to have mildly elevated CA 19-9 and CEA as well as LDH on this admission -OT recommending SNF placement -We will continue to monitor closely  Aldine Contes, MD 2/7/20231:27 PM

## 2021-07-24 NOTE — Plan of Care (Signed)
°  Problem: Health Behavior/Discharge Planning: Goal: Ability to manage health-related needs will improve Outcome: Progressing   Problem: Activity: Goal: Risk for activity intolerance will decrease Outcome: Progressing   Problem: Nutrition: Goal: Adequate nutrition will be maintained Outcome: Progressing   Problem: Elimination: Goal: Will not experience complications related to urinary retention Outcome: Progressing   Problem: Elimination: Goal: Will not experience complications related to bowel motility Outcome: Progressing   Problem: Pain Managment: Goal: General experience of comfort will improve Outcome: Progressing   Problem: Safety: Goal: Ability to remain free from injury will improve Outcome: Progressing   Problem: Skin Integrity: Goal: Risk for impaired skin integrity will decrease Outcome: Progressing

## 2021-07-24 NOTE — Progress Notes (Signed)
HD#1 SUBJECTIVE:  Patient Summary: [name] is a 76 y.o. with pertinent PMH of pancreatic mass, Barrett's esophagus, prostate CA with BOO, lung Ca, HTN, HLD,  who presented with worsening weight loss and deconditioning and admit for dehydration and malignancy workup on hospital day 1  Overnight Events: no acute events overnight    Interm History: Patient reports he has not been eating well, no appetite. Has not had pain.   OBJECTIVE:  Vital Signs: Vitals:   07/23/21 1614 07/23/21 2204 07/24/21 0538 07/24/21 0540  BP: (!) 147/82 124/74  (!) 147/89  Pulse: 79 (!) 50  72  Resp: 16 17    Temp: 98.7 F (37.1 C)   98.2 F (36.8 C)  TempSrc:    Oral  SpO2: 100% 100%  100%  Weight:   51.8 kg    Supplemental O2: Room Air SpO2: 100 %  Filed Weights   07/24/21 0538  Weight: 51.8 kg     Intake/Output Summary (Last 24 hours) at 07/24/2021 1027 Last data filed at 07/24/2021 8270 Gross per 24 hour  Intake 1104.25 ml  Output --  Net 1104.25 ml   Net IO Since Admission: 1,104.25 mL [07/24/21 1027]  Physical Exam: General: chronically ill, catechetic appearing Eyes: no scleral icterus or conjunctival injection.  Cardiac: regular rate and rhythm, no lower extremity edema Pulm: breathing comfortably on room air, lung sounds are clear GI: abdomen soft, non-tender, non-distended. Hypoactive bowel sounds.  MSK: significant muscle atrophy throughout, generalized weakness Neuro: nonfocal Skin: no rash or lesion on limited exam  Patient Lines/Drains/Airways Status     Active Line/Drains/Airways     Name Placement date Placement time Site Days   Peripheral IV 07/23/21 22 G 1.75" Anterior;Right Forearm 07/23/21  1206  Forearm  1            Pertinent Labs: CBC Latest Ref Rng & Units 07/24/2021 07/23/2021 07/09/2021  WBC 4.0 - 10.5 K/uL 3.2(L) 3.6(L) 3.7  Hemoglobin 13.0 - 17.0 g/dL 10.3(L) 12.4(L) 12.1(L)  Hematocrit 39.0 - 52.0 % 33.4(L) 40.8 38.7  Platelets 150 - 400  K/uL 95(L) 108(L) 161    CMP Latest Ref Rng & Units 07/24/2021 07/23/2021 07/09/2021  Glucose 70 - 99 mg/dL 79 96 92  BUN 8 - 23 mg/dL 18 22 15   Creatinine 0.61 - 1.24 mg/dL 1.03 1.16 1.25  Sodium 135 - 145 mmol/L 139 138 139  Potassium 3.5 - 5.1 mmol/L 3.8 4.6 4.3  Chloride 98 - 111 mmol/L 102 100 98  CO2 22 - 32 mmol/L 28 28 28   Calcium 8.9 - 10.3 mg/dL 9.5 9.8 10.0  Total Protein 6.5 - 8.1 g/dL - 6.9 -  Total Bilirubin 0.3 - 1.2 mg/dL - 1.6(H) -  Alkaline Phos 38 - 126 U/L - 49 -  AST 15 - 41 U/L - 29 -  ALT 0 - 44 U/L - 13 -    No results for input(s): GLUCAP in the last 72 hours.   Pertinent Imaging: No results found.  ASSESSMENT/PLAN:  Assessment: Principal Problem:   Severe malnutrition ([place])   [name] is a 76 y.o. with pertinent PMH of pancreatic mass, Barrett's esophagus, prostate CA with BOO, lung Ca, HTN, HLD,  who presented with worsening weight loss and deconditioning and admit for dehydration and malignancy workup on hospital day 1  Unintentional weight loss, severe malnutrition, declining functional status Suspected underlying malignancy given 40lb weight loss over the past year with deconditioning and decline in functional  status. . CEA and CA19-9, and LDH all elevated. Patient had MRCP performed today, which was limited by movement artefact, however did show some hyperintensity in pancreatitic head on imaging but these were non-diagnostic.  - GI has been consulted and is planning on EGD in the AM. Will follow up those results.  - continue maintenance fluids. - He will likely need SNF on discharge given significant muscle wasting.    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 he is scheduled to undergo EVAR on 2/10 however will need to be re-evaluated for medial clearance prior to undergoing this given his deteriorating status. Will need to discuss with [facility] prior to discharge.   Prostate cancer s/p TURP, history of BOO - patient noted to have 500cc in bladder. I+O cath and  place foley if he continues to retain.    Pancreatic insufficiency.  - Continue enzyme replacement.    CAD, history of MI (2004).  - Continue statin and aspirin.   Chronic thrombocytopenia.  - Stable. Continue to monitor.    Chronic normocytic anemia.  - Stable. Continue to monitor  Best Practice: Diet: NPO at midnight DISPO: Anticipated discharge pending Medical stability.  Signature: Delene Ruffini, MD  Internal Medicine Resident, PGY-1 [facility]  Pager: [phone] 10:27 AM, 07/24/2021   Please contact the on call pager after 5 pm and on weekends at [phone].

## 2021-07-24 NOTE — Evaluation (Signed)
Clinical/Bedside Swallow Evaluation Patient Details  Name: [name] MRN: [id number] Date of Birth: 1945/08/12  Today's Date: 07/24/2021 Time: SLP Start Time (ACUTE ONLY): 0920 SLP Stop Time (ACUTE ONLY): 0935 SLP Time Calculation (min) (ACUTE ONLY): 15 min  Past Medical History:  Past Medical History:  Diagnosis Date   AAA (abdominal aortic aneurysm)    followed by vasc surgery   Alcohol abuse    Barretts esophagus    bx distal esophagus 2011 neg    Bladder outlet obstruction 04/02/2018   CAD in native artery    a. s/p stent to prox RCA and mid Cx.   Cataract    Cholelithiasis    Chronic osteomyelitis involving ankle and foot, right ([place]) 07/13/2018   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   2006 - 3 adenomas largest 12 mm  2008 - no polyps 2013-  2 mm cecal polyp, adenoma, routine repeat colonoscopy 02/2017 approx 05/14/2017 18 polyps max 12 mm ALL adenomas recall 2019 [name], MD, [facility]      Dieulafoy lesion of duodenum    Diverticulosis of colon    DJD (degenerative joint disease)    left hip   Full dentures    upper and lower   GERD (gastroesophageal reflux disease)    diet controlled    Gout    feet   Hiatal hernia    2 cm noted on upper endos 2011    Hx of adenomatous colonic polyps    Hyperlipidemia    Hypertension    Internal hemorrhoid    Lung cancer ([place]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 pt was on Plavix x 3 years then transitioned to ASA, no problems since   Non-small cell carcinoma of right lung, stage 1 ([place]) 01/30/2016   Renal failure, acute (H[facility])    hx of 2/2 medication, resolved   Tobacco abuse    smoker .5 ppd x 55 yrs   UTI (lower urinary tract infection)    Past Surgical History:  Past Surgical History:  Procedure Laterality Date   BIOPSY  11/12/2020   Procedure: BIOPSY;  Surgeon: Doran Stabler, MD;  Location: [place];  Service:  Gastroenterology;;   cataract surgery Bilateral 2018   removal of cataracts   COLONOSCOPY     hx polyps - [name] 02/2012   COLONOSCOPY WITH PROPOFOL N/A 11/12/2020   Procedure: COLONOSCOPY WITH PROPOFOL;  Surgeon: Doran Stabler, MD;  Location: [place];  Service: Gastroenterology;  Laterality: N/A;   CORONARY ANGIOPLASTY WITH STENT PLACEMENT     ESOPHAGOGASTRODUODENOSCOPY (EGD) WITH PROPOFOL N/A 11/12/2020   Procedure: ESOPHAGOGASTRODUODENOSCOPY (EGD) WITH PROPOFOL;  Surgeon: Doran Stabler, MD;  Location: [place];  Service: Gastroenterology;  Laterality: N/A;   ESOPHAGOGASTRODUODENOSCOPY (EGD) WITH PROPOFOL N/A 03/06/2021   Procedure: ESOPHAGOGASTRODUODENOSCOPY (EGD) WITH PROPOFOL;  Surgeon: Yetta Flock, MD;  Location: [facility];  Service: Gastroenterology;  Laterality: N/A;   flexible cystourethroscopy  05/24/06   HEMOSTASIS CLIP PLACEMENT  03/06/2021   Procedure: HEMOSTASIS CLIP PLACEMENT;  Surgeon: Yetta Flock, MD;  Location: [facility];  Service: Gastroenterology;;   HOT HEMOSTASIS N/A 03/06/2021   Procedure: HOT HEMOSTASIS (ARGON PLASMA COAGULATION/BICAP);  Surgeon: Yetta Flock, MD;  Location: [facility];  Service: Gastroenterology;  Laterality: N/A;   INGUINAL HERNIA REPAIR  2005   LUNG LOBECTOMY Right 11/07   RUL  MULTIPLE TOOTH EXTRACTIONS     POLYPECTOMY  11/12/2020   Procedure: POLYPECTOMY;  Surgeon: Doran Stabler, MD;  Location: [facility];  Service: Gastroenterology;;   TRANSURETHRAL RESECTION OF PROSTATE N/A 05/02/2021   Procedure: TRANSURETHRAL RESECTION OF THE PROSTATE (TURP);  Surgeon: Lucas Mallow, MD;  Location: [facility];  Service: Urology;  Laterality: N/A;   UPPER GASTROINTESTINAL ENDOSCOPY     HPI:  Pt admitted with failure to thrive, decline for several months, decreased mobility, unintentional weight loss (40#), palpable abdomenal mass appreciated by MD. Scheduled for abdomenal MRI today. PMH: GERD, Barretts  esophagus, alcohol, lung cancer, MI.    Assessment / Plan / Recommendation  Clinical Impression  [name] is a plesant cachectic man who describes havng "no taste" for food and solids more challenging to swallow than liquids. He denied coughing or regurgitation. He was cleared for po's this am since MRI is later this afternoon. Oral mucosa normal and strength/movement was normal. He uses dentures "sometimes" when eating but able to masticate half of cracker in one trial with timely manipulation and transit. Thin via straw was timely without indications of aspiration over consecutive trials. Mechanics of oropharyngeal swallow in normal range and suspect weight loss and poor appetite/intake related to suspected organic changes. Recommend he supplement with higher caloric drinks Ensure, Boost and continue regular texture, liquids. No further ST needed. Educated pt on reflux precautions. SLP Visit Diagnosis: Dysphagia, unspecified (R13.10)    Aspiration Risk  Mild aspiration risk;Risk for inadequate nutrition/hydration    Diet Recommendation Regular;Thin liquid   Liquid Administration via: Cup;Straw Medication Administration: Whole meds with liquid Supervision: Patient able to self feed Postural Changes: Seated upright at 90 degrees;Remain upright for at least 30 minutes after po intake    Other  Recommendations Oral Care Recommendations: Oral care BID    Recommendations for follow up therapy are one component of a multi-disciplinary discharge planning process, led by the attending physician.  Recommendations may be updated based on patient status, additional functional criteria and insurance authorization.  Follow up Recommendations No SLP follow up      Assistance Recommended at Discharge None  Functional Status Assessment    Frequency and Duration            Prognosis        Swallow Study   General Date of Onset: 07/23/21 HPI: Pt admitted with failure to thrive, decline for  several months, decreased mobility, unintentional weight loss (40#), palpable abdomenal mass appreciated by MD. Scheduled for abdomenal MRI today. PMH: GERD, Barretts esophagus, alcohol, lung cancer, MI. Type of Study: Bedside Swallow Evaluation Previous Swallow Assessment: none Diet Prior to this Study: Regular;Dysphagia 1 (puree) Temperature Spikes Noted: No Respiratory Status: Room air History of Recent Intubation: No Behavior/Cognition: Alert;Cooperative;Pleasant mood Oral Cavity Assessment: Within Functional Limits Oral Care Completed by SLP: No Oral Cavity - Dentition: Edentulous (has dentures, sometimes used when eating) Vision: Functional for self-feeding Self-Feeding Abilities: Able to feed self Patient Positioning: Upright in bed Baseline Vocal Quality: Normal    Oral/Motor/Sensory Function Overall Oral Motor/Sensory Function: Within functional limits   Ice Chips Ice chips: Not tested   Thin Liquid Thin Liquid: Within functional limits    Nectar Thick Nectar Thick Liquid: Not tested   Honey Thick Honey Thick Liquid: Not tested   Puree Puree: Within functional limits   Solid     Solid: Within functional limits      Houston Siren 07/24/2021,10:44 AM  Orbie Pyo Mick Sell  M.Ed Actor Pager [phone] Office [phone]

## 2021-07-25 ENCOUNTER — Encounter (HOSPITAL_COMMUNITY)
Admission: RE | Disposition: A | Discharge status: Skilled Nursing Facility | Payer: Self-pay | Source: Home / Self Care | Attending: Internal Medicine

## 2021-07-25 ENCOUNTER — Inpatient Hospital Stay (HOSPITAL_COMMUNITY): Payer: Medicare HMO | Admitting: Certified Registered Nurse Anesthetist

## 2021-07-25 ENCOUNTER — Inpatient Hospital Stay (HOSPITAL_COMMUNITY): Payer: Medicare HMO

## 2021-07-25 ENCOUNTER — Encounter (HOSPITAL_COMMUNITY): Payer: Self-pay | Admitting: Internal Medicine

## 2021-07-25 DIAGNOSIS — D490 Neoplasm of unspecified behavior of digestive system: Secondary | ICD-10-CM | POA: Insufficient documentation

## 2021-07-25 DIAGNOSIS — N189 Chronic kidney disease, unspecified: Secondary | ICD-10-CM | POA: Diagnosis not present

## 2021-07-25 DIAGNOSIS — I130 Hypertensive heart and chronic kidney disease with heart failure and stage 1 through stage 4 chronic kidney disease, or unspecified chronic kidney disease: Secondary | ICD-10-CM | POA: Diagnosis not present

## 2021-07-25 DIAGNOSIS — I509 Heart failure, unspecified: Secondary | ICD-10-CM | POA: Diagnosis not present

## 2021-07-25 DIAGNOSIS — D631 Anemia in chronic kidney disease: Secondary | ICD-10-CM | POA: Diagnosis not present

## 2021-07-25 DIAGNOSIS — I2510 Atherosclerotic heart disease of native coronary artery without angina pectoris: Secondary | ICD-10-CM | POA: Diagnosis not present

## 2021-07-25 DIAGNOSIS — K3189 Other diseases of stomach and duodenum: Secondary | ICD-10-CM | POA: Diagnosis not present

## 2021-07-25 DIAGNOSIS — E43 Unspecified severe protein-calorie malnutrition: Secondary | ICD-10-CM | POA: Diagnosis not present

## 2021-07-25 HISTORY — PX: ESOPHAGOGASTRODUODENOSCOPY (EGD) WITH PROPOFOL: SHX5813

## 2021-07-25 HISTORY — PX: BIOPSY: SHX5522

## 2021-07-25 LAB — COMPREHENSIVE METABOLIC PANEL
ALT: 13 U/L (ref 0–44)
AST: 22 U/L (ref 15–41)
Albumin: 2.8 g/dL — ABNORMAL LOW (ref 3.5–5.0)
Alkaline Phosphatase: 48 U/L (ref 38–126)
Anion gap: 10 (ref 5–15)
BUN: 17 mg/dL (ref 8–23)
CO2: 27 mmol/L (ref 22–32)
Calcium: 9.6 mg/dL (ref 8.9–10.3)
Chloride: 101 mmol/L (ref 98–111)
Creatinine, Ser: 0.94 mg/dL (ref 0.61–1.24)
GFR, Estimated: >60 mL/min (ref >60)
Glucose, Bld: 79 mg/dL (ref 70–99)
Potassium: 3.6 mmol/L (ref 3.5–5.1)
Sodium: 138 mmol/L (ref 135–145)
Total Bilirubin: 0.5 mg/dL (ref 0.3–1.2)
Total Protein: 6.5 g/dL (ref 6.5–8.1)

## 2021-07-25 LAB — CBC
HCT: 36 % — ABNORMAL LOW (ref 39.0–52.0)
Hemoglobin: 10.9 g/dL — ABNORMAL LOW (ref 13.0–17.0)
MCH: 26.9 pg (ref 26.0–34.0)
MCHC: 30.3 g/dL (ref 30.0–36.0)
MCV: 88.9 fL (ref 80.0–100.0)
Platelets: 98 10*3/uL — ABNORMAL LOW (ref 150–400)
RBC: 4.05 MIL/uL — ABNORMAL LOW (ref 4.22–5.81)
RDW: 17.2 % — ABNORMAL HIGH (ref 11.5–15.5)
WBC: 3.3 10*3/uL — ABNORMAL LOW (ref 4.0–10.5)
nRBC: 0 % (ref 0.0–0.2)

## 2021-07-25 LAB — GLUCOSE, CAPILLARY: Glucose-Capillary: 105 mg/dL — ABNORMAL HIGH (ref 70–99)

## 2021-07-25 SURGERY — ESOPHAGOGASTRODUODENOSCOPY (EGD) WITH PROPOFOL
Anesthesia: Monitor Anesthesia Care

## 2021-07-25 MED ORDER — MIRTAZAPINE 15 MG PO TABS
7.5000 mg | ORAL_TABLET | Freq: Every day | ORAL | Status: DC
  Administered 2021-07-26 – 2021-07-30 (×5): 7.5 mg via ORAL
  Filled 2021-07-25 (×6): qty 1

## 2021-07-25 MED ORDER — ONDANSETRON HCL 4 MG/2ML IJ SOLN
4.0000 mg | Freq: Three times a day (TID) | INTRAMUSCULAR | Status: DC | PRN
  Filled 2021-07-25: qty 2

## 2021-07-25 MED ORDER — IOHEXOL 300 MG/ML  SOLN
75.0000 mL | Freq: Once | INTRAMUSCULAR | Status: AC | PRN
  Administered 2021-07-25: 75 mL via INTRAVENOUS

## 2021-07-25 MED ORDER — CHLORHEXIDINE GLUCONATE CLOTH 2 % EX PADS
6.0000 | MEDICATED_PAD | Freq: Every day | CUTANEOUS | Status: DC
  Administered 2021-07-25 – 2021-07-31 (×7): 6 via TOPICAL

## 2021-07-25 MED ORDER — PHENYLEPHRINE 40 MCG/ML (10ML) SYRINGE FOR IV PUSH (FOR BLOOD PRESSURE SUPPORT)
PREFILLED_SYRINGE | INTRAVENOUS | Status: DC | PRN
  Administered 2021-07-25 (×2): 120 ug via INTRAVENOUS
  Administered 2021-07-25 (×3): 80 ug via INTRAVENOUS

## 2021-07-25 MED ORDER — PROPOFOL 500 MG/50ML IV EMUL
INTRAVENOUS | Status: DC | PRN
  Administered 2021-07-25: 100 ug/kg/min via INTRAVENOUS

## 2021-07-25 MED ORDER — EPHEDRINE SULFATE-NACL 50-0.9 MG/10ML-% IV SOSY
PREFILLED_SYRINGE | INTRAVENOUS | Status: DC | PRN
  Administered 2021-07-25 (×2): 5 mg via INTRAVENOUS

## 2021-07-25 MED ORDER — LACTATED RINGERS IV SOLN: INTRAVENOUS | Status: DC | PRN

## 2021-07-25 MED ORDER — PROPOFOL 10 MG/ML IV BOLUS
INTRAVENOUS | Status: DC | PRN
  Administered 2021-07-25 (×2): 20 mg via INTRAVENOUS

## 2021-07-25 MED ORDER — PHENYLEPHRINE HCL-NACL 20-0.9 MG/250ML-% IV SOLN
INTRAVENOUS | Status: DC | PRN
  Administered 2021-07-25: 25 ug/min via INTRAVENOUS

## 2021-07-25 MED ORDER — DEXTROSE IN LACTATED RINGERS 5 % IV SOLN: INTRAVENOUS | Status: AC

## 2021-07-25 NOTE — Progress Notes (Signed)
HD#2 SUBJECTIVE:  Patient Summary: [name] is a 76 y.o. with pertinent PMH of pancreatic mass, Barrett's esophagus, prostate CA with BOO, lung Ca, HTN, HLD,  who presented with worsening weight loss and deconditioning and admit for dehydration and malignancy workup on hospital day 1  Overnight Events: no acute events overnight    Interm History: Patient reports he has not been eating well, no appetite. Has not had pain. Doing well after procedure. Discussed with patient and sister at bedside that the procedure done today was to evaluate him for his weight loss and the findings in his pancreas seen on imaging. He thought it was for aneurysm. We also discussed EVAR on Friday with him and that it is a separate procedure. Discussed starting mirtazapine with him to help improve appetite.  He is willing to have discussion with palliative about goals of care.   OBJECTIVE:  Vital Signs: Vitals:   07/25/21 1007 07/25/21 1019 07/25/21 1020 07/25/21 1108  BP:  (!) 160/85  (!) 160/110  Pulse: 88  (!) 44 71  Resp: (!) 23 (!) 24 19 19   Temp:  97.8 F (36.6 C)    TempSrc:      SpO2: 91% 100% 100% 97%  Weight:      Height:       Supplemental O2: Room Air SpO2: 97 % O2 Flow Rate (L/min): 2 L/min  Filed Weights   07/24/21 0538 07/25/21 0829  Weight: 51.8 kg 51.8 kg     Intake/Output Summary (Last 24 hours) at 07/25/2021 1431 Last data filed at 07/25/2021 1020 Gross per 24 hour  Intake 1653.81 ml  Output 350 ml  Net 1303.81 ml   Net IO Since Admission: 2,348.06 mL [07/25/21 1431]  Physical Exam: General: chronically ill, catechetic appearing Eyes: no scleral icterus or conjunctival injection.  Cardiac: regular rate and rhythm, no lower extremity edema Pulm: breathing comfortably on room air, lung sounds are clear GI: abdomen soft, non-tender, non-distended. Hypoactive bowel sounds.  MSK: significant muscle atrophy throughout, generalized weakness Neuro: nonfocal Skin: no rash  or lesion on limited exam  Patient Lines/Drains/Airways Status     Active Line/Drains/Airways     Name Placement date Placement time Site Days   Peripheral IV 07/23/21 22 G 1.75" Anterior;Right Forearm 07/23/21  1206  Forearm  1            Pertinent Labs: CBC Latest Ref Rng & Units 07/25/2021 07/24/2021 07/23/2021  WBC 4.0 - 10.5 K/uL 3.3(L) 3.2(L) 3.6(L)  Hemoglobin 13.0 - 17.0 g/dL 10.9(L) 10.3(L) 12.4(L)  Hematocrit 39.0 - 52.0 % 36.0(L) 33.4(L) 40.8  Platelets 150 - 400 K/uL 98(L) 95(L) 108(L)    CMP Latest Ref Rng & Units 07/25/2021 07/24/2021 07/23/2021  Glucose 70 - 99 mg/dL 79 79 96  BUN 8 - 23 mg/dL 17 18 22   Creatinine 0.61 - 1.24 mg/dL 0.94 1.03 1.16  Sodium 135 - 145 mmol/L 138 139 138  Potassium 3.5 - 5.1 mmol/L 3.6 3.8 4.6  Chloride 98 - 111 mmol/L 101 102 100  CO2 22 - 32 mmol/L 27 28 28   Calcium 8.9 - 10.3 mg/dL 9.6 9.5 9.8  Total Protein 6.5 - 8.1 g/dL 6.5 - 6.9  Total Bilirubin 0.3 - 1.2 mg/dL 0.5 - 1.6(H)  Alkaline Phos 38 - 126 U/L 48 - 49  AST 15 - 41 U/L 22 - 29  ALT 0 - 44 U/L 13 - 13    Recent Labs    07/24/21 1116 07/25/21  0740  GLUCAP 96 105*     Pertinent Imaging: No results found.  ASSESSMENT/PLAN:  Assessment: Principal Problem:   Severe malnutrition ([place]) Active Problems:   Neoplasm of ampulla   [name] is a 76 y.o. with pertinent PMH of pancreatic mass, Barrett's esophagus, prostate CA with BOO, lung Ca, HTN, HLD,  who presented with worsening weight loss and deconditioning and admit for dehydration and malignancy workup on hospital day 2  Unintentional weight loss, severe malnutrition, declining functional status Suspected underlying malignancy given 40lb weight loss over the past year with deconditioning and decline in functional status. . CEA and CA19-9, and LDH all elevated. Patient had MRCP performed  limited by movement artefact, however did show some hyperintensity in pancreatitic head on imaging but these were  non-diagnostic.  - GI performed EGD which showed bulging ampulla with abnormal mucosa suspicious for adenoma. Recommending outpatient EUS for further investigation. He has been started on mirtazapine to stimulate appetite.   - continue maintenance fluids. - CT chest ordered to further investigate weight loss.  - He will likely need SNF on discharge given significant muscle wasting.    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 he is scheduled to undergo EVAR on 2/10.  Will discuss with vascular to see if they want to keep him here until procedure. Will also plan to have palliative discuss with patient and sister at bedside prior to undergoing EVAR to establish [place].  - f/u palliative recs.   Prostate cancer s/p TURP, history of BOO - patient has been retaining urine. Resistance noted when I+O performed. Coude cath to be placed.    Pancreatic insufficiency.  - Continue enzyme replacement.    CAD, history of MI (2004).  - Continue statin and aspirin.   Chronic thrombocytopenia.  - Stable. Continue to monitor.    Chronic normocytic anemia.  - Stable. Continue to monitor  Best Practice: Diet: NPO at midnight DISPO: Anticipated discharge pending Medical stability.  Signature: Delene Ruffini, MD  Internal Medicine Resident, PGY-1 [facility]  Pager: [phone] 2:31 PM, 07/25/2021   Please contact the on call pager after 5 pm and on weekends at [phone].

## 2021-07-25 NOTE — Transfer of Care (Signed)
Immediate Anesthesia Transfer of Care Note  Patient: [name]  Procedure(s) Performed: ESOPHAGOGASTRODUODENOSCOPY (EGD) WITH PROPOFOL BIOPSY  Patient Location: PACU  Anesthesia Type:MAC  Level of Consciousness: drowsy  Airway & Oxygen Therapy: Patient Spontanous Breathing and Patient connected to nasal cannula oxygen  Post-op Assessment: Report given to RN and Post -op Vital signs reviewed and stable  Post vital signs: Reviewed and stable  Last Vitals:  Vitals Value Taken Time  BP 115/80 07/25/21 0949  Temp    Pulse 67 07/25/21 0949  Resp 24 07/25/21 0950  SpO2 100 % 07/25/21 0949  Vitals shown include unvalidated device data.  Last Pain:  Vitals:   07/25/21 0829  TempSrc: Oral  PainSc: 0-No pain      Patients Stated Pain Goal: 0 (65/68/12 7517)  Complications: No notable events documented.

## 2021-07-25 NOTE — Interval H&P Note (Signed)
History and Physical Interval Note:  07/25/2021 8:55 AM  [name]  has presented today for surgery, with the diagnosis of biopsy of ampullary lesion found on EGD in September 2022.  The various methods of treatment have been discussed with the patient and family. After consideration of risks, benefits and other options for treatment, the patient has consented to  Procedure(s): ESOPHAGOGASTRODUODENOSCOPY (EGD) WITH PROPOFOL (N/A) as a surgical intervention.  The patient's history has been reviewed, patient examined, no change in status, stable for surgery.  I have reviewed the patient's chart and labs.  Questions were answered to the patient's satisfaction.     Daryel November

## 2021-07-25 NOTE — TOC Initial Note (Signed)
Transition of Care [facility]) - Initial/Assessment Note    Patient Details  Name: [name] MRN: [id number] Date of Birth: [birth year]  [facility]) CM/SW Contact:    [name], LCSW Phone Number: 07/25/2021, 4:39 PM  Clinical Narrative:                  CSW received SNF consult. CSW met with pt at bedside. CSW introduced self and explained role at the hospital. Pt reports that PTA he was living at home alne. Pt reports he was able to complete ADL's on his own. Pt reports he was walking independstly with some falls over the past few months.  CSW reviewed PT/OT recommendations for SNF. Pt reports he is fine with going to SNF. Pt gave CSW permission to fax out to facilities in the area. Pt has no preference of facility at this time. CSW gave pt medicare.gov rating list to review. CSW explained insurance auth process. Pt reports they are covid vaccinated.  CSW will continue to follow.    Barriers to Discharge: Ship broker, Continued Medical Work up   Patient Goals and CMS Choice Patient states their goals for this hospitalization and ongoing recovery are:: to build strenght CMS Medicare.gov Compare Post Acute Care list provided to:: Patient Choice offered to / list presented to : Patient  Expected Discharge Plan and Services         Living arrangements for the past 2 months: Apartment                                      Prior Living Arrangements/Services Living arrangements for the past 2 months: Apartment Lives with:: Self Patient language and need for interpreter reviewed:: Yes Do you feel safe going back to the place where you live?: Yes      Need for Family Participation in Patient Care: Yes (Comment) Care giver support system in place?: Yes (comment) Current home services: DME Criminal Activity/Legal Involvement Pertinent to Current Situation/Hospitalization: No - Comment as needed  Activities of [name] Living      Permission  Sought/Granted Permission sought to share information with : Facility Art therapist granted to share information with : Yes, Verbal Permission Granted     Permission granted to share info w AGENCY: SNF        Emotional Assessment Appearance:: Appears stated age Attitude/Demeanor/Rapport: Engaged Affect (typically observed): Appropriate Orientation: : Oriented to Self, Oriented to Place, Oriented to  Time, Oriented to Situation Alcohol / Substance Use: Not Applicable Psych Involvement: No (comment)  Admission diagnosis:  Severe malnutrition ([place]) [E43] Patient Active Problem List   Diagnosis Date Noted   Neoplasm of ampulla    Dehydration 07/23/2021   Severe malnutrition ([place]) 07/23/2021   Adenocarcinoma of prostate ([place]) 05/02/2021   Atrial fibrillation ([place]) 03/05/2021   Pancreatic mass 11/08/2020   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   Protein-calorie malnutrition, severe 11/07/2020   Urinary retention 04/02/2018   Mild alcohol use disorder 12/09/2016   Premature atrial contractions 07/31/2015   Chronic use of opiate for therapeutic purpose 05/01/2015   Atherosclerosis of aorta ([place]) 01/13/2015   Prediabetes 12/30/2014   AAA (abdominal aortic aneurysm)    Healthcare maintenance 06/30/2012   CAD (coronary artery disease) 03/17/2012   Erectile dysfunction 10/12/2010   Arthropathy of pelvic region and thigh 05/02/2008   CKD (chronic kidney disease) stage 2, GFR  60-89 ml/min 08/07/2007   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   Gout 04/18/2006   Tobacco use disorder 04/18/2006   Essential hypertension 04/18/2006   GERD 04/18/2006   PCP:  Axel Filler, MD Pharmacy:   [id number] [id number] [id number]: [phone]: [phone] [facility] [id number]: [phone]: [phone]    Social Determinants of Health (SDOH) Interventions    Readmission Risk Interventions No flowsheet data found.  MEmeterio Reeve LCSW Clinical Social Worker

## 2021-07-25 NOTE — Anesthesia Preprocedure Evaluation (Signed)
Anesthesia Evaluation  Patient identified by MRN, date of birth, ID band Patient awake    Reviewed: Allergy & Precautions, NPO status , Patient's Chart, lab work & pertinent test results  Airway Mallampati: II  TM Distance: >3 FB Neck ROM: Full    Dental  (+) Edentulous Upper, Edentulous Lower   Pulmonary COPD,  COPD inhaler, Current Smoker and Patient abstained from smoking.,  28 pack year history   Lung ca s/p lobectomy    Pulmonary exam normal breath sounds clear to auscultation       Cardiovascular hypertension, Pt. on medications + CAD, + Past MI, + Cardiac Stents (2004) and +CHF (grade 1 diastolic dysfunction)  Normal cardiovascular exam Rhythm:Regular Rate:Normal  2022: 1. Left ventricular ejection fraction, by estimation, is 55 to 60%. The  left ventricle has normal function. Left ventricular endocardial border  not optimally defined to evaluate regional wall motion. There is mild left  ventricular hypertrophy. Left  ventricular diastolic parameters are consistent with Grade I diastolic  dysfunction (impaired relaxation).  2. Right ventricular systolic function was not well visualized. The right  ventricular size is not well visualized. There is normal pulmonary artery  systolic pressure. The estimated right ventricular systolic pressure is  40.1 mmHg.  3. The mitral valve is normal in structure. No evidence of mitral valve  regurgitation. No evidence of mitral stenosis.  4. The aortic valve is tricuspid. Aortic valve regurgitation is not  visualized. No aortic stenosis is present.  5. The inferior vena cava is normal in size with greater than 50%  respiratory variability, suggesting right atrial pressure of 3 mmHg.  6. Technically difficult study with poor acoustic windows.    Neuro/Psych negative psych ROS   GI/Hepatic Neg liver ROS, hiatal hernia, GERD  Medicated and Controlled,Ampullary lesion    Endo/Other  negative endocrine ROS  Renal/GU CRFRenal disease  negative genitourinary   Musculoskeletal  (+) Arthritis ,   Abdominal   Peds negative pediatric ROS (+)  Hematology  (+) Blood dyscrasia, anemia , Hb 10.9, plt 98   Anesthesia Other Findings   Reproductive/Obstetrics negative OB ROS                             Anesthesia Physical Anesthesia Plan  ASA: 3  Anesthesia Plan: MAC   Post-op Pain Management:    Induction:   PONV Risk Score and Plan: 2 and Propofol infusion and TIVA  Airway Management Planned: Natural Airway and Simple Face Mask  Additional Equipment: None  Intra-op Plan:   Post-operative Plan:   Informed Consent: I have reviewed the patients History and Physical, chart, labs and discussed the procedure including the risks, benefits and alternatives for the proposed anesthesia with the patient or authorized representative who has indicated his/her understanding and acceptance.       Plan Discussed with: CRNA  Anesthesia Plan Comments:         Anesthesia Quick Evaluation

## 2021-07-25 NOTE — NC FL2 (Signed)
[place] LEVEL OF CARE SCREENING TOOL     IDENTIFICATION  Patient Name: [name] Birthdate: 1946-05-17 Sex: male Admission Date (Current Location): 07/23/2021  [facility] and [place] Number:  Herbalist and Address:  The [place], [place] [address] [place], [place] [address]      Provider Number: [id number]  Attending Physician Name and Address:  Aldine Contes, MD  Relative Name and Phone Number:       Current Level of Care: Hospital Recommended Level of Care: [place] Prior Approval Number:    Date Approved/Denied:   PASRR Number: [id number]  Discharge Plan: SNF    Current Diagnoses: Patient Active Problem List   Diagnosis Date Noted   Neoplasm of ampulla    Dehydration 07/23/2021   Severe malnutrition ([name]) 07/23/2021   Adenocarcinoma of prostate ([name]) 05/02/2021   Atrial fibrillation ([name]) 03/05/2021   Pancreatic mass 11/08/2020   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) 11/08/2020   Protein-calorie malnutrition, severe 11/07/2020   Urinary retention 04/02/2018   Mild alcohol use disorder 12/09/2016   Premature atrial contractions 07/31/2015   Chronic use of opiate for therapeutic purpose 05/01/2015   Atherosclerosis of aorta ([name]) 01/13/2015   Prediabetes 12/30/2014   AAA (abdominal aortic aneurysm)    Healthcare maintenance 06/30/2012   CAD (coronary artery disease) 03/17/2012   Erectile dysfunction 10/12/2010   Arthropathy of pelvic region and thigh 05/02/2008   CKD (chronic kidney disease) stage 2, GFR 60-89 ml/min 08/07/2007   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   Gout 04/18/2006   Tobacco use disorder 04/18/2006   Essential hypertension 04/18/2006   GERD 04/18/2006    Orientation RESPIRATION BLADDER Height & Weight     Self, Time, Situation, Place  Normal, O2 (Currently on 2L, see DC summary) External catheter Weight: 114 lb 3.2 oz (51.8 kg) Height:  6' (182.9 cm)   BEHAVIORAL SYMPTOMS/MOOD NEUROLOGICAL BOWEL NUTRITION STATUS      Continent Diet  AMBULATORY STATUS COMMUNICATION OF NEEDS Skin   Limited Assist Verbally Normal                       Personal Care Assistance Level of Assistance  Bathing, Feeding, Dressing Bathing Assistance: Limited assistance Feeding assistance: Independent Dressing Assistance: Limited assistance     Functional Limitations Info  Sight, Hearing, Speech Sight Info: Adequate Hearing Info: Adequate Speech Info: Adequate    SPECIAL CARE FACTORS FREQUENCY  PT (By licensed PT), OT (By licensed OT)     PT Frequency: 5x a week OT Frequency: 5x a week            Contractures      Additional Factors Info  Code Status, Allergies Code Status Info: Full Allergies Info: Candesartan Cilexetil-hctz   Hydrochlorothiazide   Shellfish Allergy           Current Medications (07/25/2021):  This is the current hospital active medication list Current Facility-Administered Medications  Medication Dose Route Frequency Provider Last Rate Last Admin   allopurinol (ZYLOPRIM) tablet 100 mg  100 mg Oral Daily Christian, Rylee, MD   100 mg at 07/24/21 0950   aspirin EC tablet 81 mg  81 mg Oral Daily Christian, Rylee, MD   81 mg at 07/24/21 0950   Chlorhexidine Gluconate Cloth 2 % PADS 6 each  6 each Topical Daily Aldine Contes, MD   6 each at 07/25/21 1538   HYDROcodone-acetaminophen (NORCO) 7.5-325 MG per tablet  1 tablet  1 tablet Oral BID PRN Mitzi Hansen, MD   1 tablet at 07/25/21 1538   lipase/protease/amylase (CREON) capsule 24,000 Units  24,000 Units Oral TID [facility] Christian, Rylee, MD   24,000 Units at 07/25/21 1157   mirtazapine (REMERON) tablet 7.5 mg  7.5 mg Oral QHS Delene Ruffini, MD       ondansetron (ZOFRAN) injection 4 mg  4 mg Intravenous Q8H PRN Delene Ruffini, MD       rosuvastatin (CRESTOR) tablet 20 mg  20 mg Oral Daily Christian, Rylee, MD   20 mg at 07/24/21 0950   tamsulosin (FLOMAX) capsule  0.8 mg  0.8 mg Oral QPM Christian, Rylee, MD   0.8 mg at 07/24/21 1811     Discharge Medications: Please see discharge summary for a list of discharge medications.  Relevant Imaging Results:  Relevant Lab Results:   Additional Information [phone], covid vax x4  [name], [name]

## 2021-07-25 NOTE — Progress Notes (Signed)
Pt unable to void spontaneously this shift, bladder scan = 389, I&O cath for 200, so resistance noted, scant bleeding also noted, pt tolerated well, 2nd I&O cath this admit, will monitor closely

## 2021-07-25 NOTE — Op Note (Signed)
[facility] Patient Name: [name] Procedure Date : 07/25/2021 MRN: [id number] Attending MD: Gladstone Pih. Candis Schatz , MD Date of Birth: [birth year] CSN: [id number] Age: 76 Admit Type: Inpatient Procedure:                Upper GI endoscopy Indications:              Diagnostic procedure Providers:                Nicki Reaper E. Candis Schatz, MD, Ervin Knack, RN, Luan Moore, Technician, Dellie Catholic, CRNA Referring MD:              Medicines:                Monitored Anesthesia Care Complications:            No immediate complications. Estimated Blood Loss:     Estimated blood loss was minimal. Procedure:                Pre-Anesthesia Assessment:                           - Prior to the procedure, a History and Physical                            was performed, and patient medications and                            allergies were reviewed. The patient's tolerance of                            previous anesthesia was also reviewed. The risks                            and benefits of the procedure and the sedation                            options and risks were discussed with the patient.                            All questions were answered, and informed consent                            was obtained. Prior Anticoagulants: The patient has                            taken no previous anticoagulant or antiplatelet                            agents. ASA Grade Assessment: III - A patient with                            severe systemic disease. After reviewing the risks  and benefits, the patient was deemed in                            satisfactory condition to undergo the procedure.                           After obtaining informed consent, the endoscope was                            passed under direct vision. Throughout the                            procedure, the patient's blood pressure, pulse, and                             oxygen saturations were monitored continuously. The                            [id number]-[id number] ([id number] Olympus endoscope was introduced                            through the mouth, and advanced to the fourth part                            of duodenum. The upper GI endoscopy was technically                            difficult and complex due to a J-shaped stomach                            which made pyloric intubation difficult. Successful                            completion of the procedure was aided by using                            manual pressure and withdrawing the scope and                            replacing with the enteroscope. The patient                            tolerated the procedure well. Scope In: Scope Out: Findings:      The examined portions of the nasopharynx, oropharynx and larynx were       normal.      The examined esophagus was normal.      A single submucosal lesion measuring 5 mm in diameter was found in the       gastric antrum. This lesion had a central umbilication consistent with a       pancreatic rest.      Bilious fluid was found in the gastric body.      The exam of the stomach was otherwise normal.      A single 7 mm nodule was found in the second  portion of the duodenum.       Biopsies were taken with a cold forceps for histology. Estimated blood       loss was minimal.      A bulging ampulla with abnormal appearing mucosa was found in the second       portion of the duodenum. The papilla was not positively identified, but       this lesion was in the area of the ampulla. Biopsies were taken with a       cold forceps for histology. Estimated blood loss was minimal.      The exam of the duodenum was otherwise normal. Impression:               - The examined portions of the nasopharynx,                            oropharynx and larynx were normal.                           - Normal esophagus.                           - A single lesion  diagnostic of aberrant pancreas                            was found in the stomach.                           - Bilious gastric fluid.                           - Nodule found in the duodenum. Biopsied.                           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 Biopsied. Recommendation:           - Patient has a contact number available for                            emergencies. The signs and symptoms of potential                            delayed complications were discussed with the                            patient. Return to normal activities tomorrow.                            Written discharge instructions were provided to the                            patient.                           - Resume previous diet.                           -  Continue present medications.                           - Await pathology results.                           - Patient will endoscopic ultrasound to further                            evaluate abnormal ampulla and possible                            ampullectomy. Unclear if this explains his anorexi                            and weight loss. Procedure may be technically                            difficult due to patient's J-shaped stomach                            (colonoscope was used to access the second portion                            of the duodenum)                           - Consider addition of appetite stimulant such as                            mirtazepine to help patient with weight loss. 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 All rights reserved. The codes documented in this report are preliminary and upon coder review may  be revised to meet current compliance requirements. Amalio Loe E. Candis Schatz, MD 07/25/2021 10:00:36 AM This report has been signed electronically. Number of Addenda: 0

## 2021-07-25 NOTE — Progress Notes (Signed)
Mobility Specialist Progress Note:   07/25/21 1600  Mobility  Activity Refused mobility   Pt stating he's tired and wants to rest.  [facility] [place] Mobility Specialist Primary Phone [phone]

## 2021-07-25 NOTE — Anesthesia Procedure Notes (Signed)
Procedure Name: MAC Date/Time: 07/25/2021 9:02 AM Performed by: Carolan Clines, CRNA Pre-anesthesia Checklist: Patient identified, Emergency Drugs available, Suction available and Patient being monitored Patient Re-evaluated:Patient Re-evaluated prior to induction Oxygen Delivery Method: Nasal cannula Dental Injury: Teeth and Oropharynx as per pre-operative assessment

## 2021-07-25 NOTE — Anesthesia Postprocedure Evaluation (Signed)
Anesthesia Post Note  Patient: Filbert Berthold  Procedure(s) Performed: ESOPHAGOGASTRODUODENOSCOPY (EGD) WITH PROPOFOL BIOPSY     Patient location during evaluation: PACU Anesthesia Type: MAC Level of consciousness: awake and alert Pain management: pain level controlled Vital Signs Assessment: post-procedure vital signs reviewed and stable Respiratory status: spontaneous breathing, nonlabored ventilation and respiratory function stable Cardiovascular status: blood pressure returned to baseline and stable Postop Assessment: no apparent nausea or vomiting Anesthetic complications: no   No notable events documented.  Last Vitals:  Vitals:   07/25/21 1020 07/25/21 1108  BP:  (!) 160/110  Pulse: (!) 44 71  Resp: 19 19  Temp:    SpO2: 100% 97%    Last Pain:  Vitals:   07/25/21 1019  TempSrc:   PainSc: 0-No pain                 Pervis Hocking

## 2021-07-26 ENCOUNTER — Encounter (HOSPITAL_COMMUNITY): Payer: Self-pay | Admitting: Certified Registered Nurse Anesthetist

## 2021-07-26 ENCOUNTER — Encounter (HOSPITAL_COMMUNITY): Payer: Self-pay | Admitting: Gastroenterology

## 2021-07-26 DIAGNOSIS — Z7189 Other specified counseling: Secondary | ICD-10-CM

## 2021-07-26 DIAGNOSIS — E43 Unspecified severe protein-calorie malnutrition: Secondary | ICD-10-CM | POA: Diagnosis not present

## 2021-07-26 DIAGNOSIS — R918 Other nonspecific abnormal finding of lung field: Secondary | ICD-10-CM | POA: Diagnosis not present

## 2021-07-26 DIAGNOSIS — I714 Abdominal aortic aneurysm, without rupture, unspecified: Secondary | ICD-10-CM

## 2021-07-26 DIAGNOSIS — R531 Weakness: Secondary | ICD-10-CM

## 2021-07-26 DIAGNOSIS — Z515 Encounter for palliative care: Secondary | ICD-10-CM

## 2021-07-26 LAB — COMPREHENSIVE METABOLIC PANEL
ALT: 10 U/L (ref 0–44)
AST: 21 U/L (ref 15–41)
Albumin: 2.4 g/dL — ABNORMAL LOW (ref 3.5–5.0)
Alkaline Phosphatase: 39 U/L (ref 38–126)
Anion gap: 7 (ref 5–15)
BUN: 13 mg/dL (ref 8–23)
CO2: 27 mmol/L (ref 22–32)
Calcium: 9.1 mg/dL (ref 8.9–10.3)
Chloride: 100 mmol/L (ref 98–111)
Creatinine, Ser: 0.85 mg/dL (ref 0.61–1.24)
GFR, Estimated: >60 mL/min (ref >60)
Glucose, Bld: 105 mg/dL — ABNORMAL HIGH (ref 70–99)
Potassium: 3.7 mmol/L (ref 3.5–5.1)
Sodium: 134 mmol/L — ABNORMAL LOW (ref 135–145)
Total Bilirubin: 0.7 mg/dL (ref 0.3–1.2)
Total Protein: 5.7 g/dL — ABNORMAL LOW (ref 6.5–8.1)

## 2021-07-26 LAB — CBC
HCT: 33 % — ABNORMAL LOW (ref 39.0–52.0)
Hemoglobin: 10.6 g/dL — ABNORMAL LOW (ref 13.0–17.0)
MCH: 27.9 pg (ref 26.0–34.0)
MCHC: 32.1 g/dL (ref 30.0–36.0)
MCV: 86.8 fL (ref 80.0–100.0)
Platelets: 85 10*3/uL — ABNORMAL LOW (ref 150–400)
RBC: 3.8 MIL/uL — ABNORMAL LOW (ref 4.22–5.81)
RDW: 17.2 % — ABNORMAL HIGH (ref 11.5–15.5)
WBC: 5.7 10*3/uL (ref 4.0–10.5)
nRBC: 0 % (ref 0.0–0.2)

## 2021-07-26 LAB — SURGICAL PATHOLOGY

## 2021-07-26 LAB — C-REACTIVE PROTEIN: CRP: 2 mg/dL — ABNORMAL HIGH (ref <1.0)

## 2021-07-26 LAB — SEDIMENTATION RATE: Sed Rate: 12 mm/hr (ref 0–16)

## 2021-07-26 MED ORDER — ENSURE ENLIVE PO LIQD
237.0000 mL | Freq: Three times a day (TID) | ORAL | Status: DC
  Administered 2021-07-26 – 2021-07-31 (×14): 237 mL via ORAL

## 2021-07-26 NOTE — Progress Notes (Signed)
Mobility Specialist Progress Note:   07/26/21 1008  Mobility  Activity Ambulated with assistance in room  Level of Assistance Contact guard assist, steadying assist  Assistive Device Front wheel walker  Distance Ambulated (ft) 6 ft  Activity Response Tolerated fair  $Mobility charge 1 Mobility   Pt received in bed willing to ambulate. No complaints of pain but pt stated he feels weak. Pt stood and took one step and said he felt weak and needed to sit. Pt agreed to try again and took 2 steps then said he felt weak and needed to sit. Agreed to try again after he eats lunch. Will follow-up as time allows. Left in bed with call bel in reach and all needs met.   [facility] Public librarian Phone [phone]

## 2021-07-26 NOTE — Progress Notes (Signed)
76 year old male admitted about 3 days ago with failure to thrive.  He is scheduled tomorrow for EVAR of his 6.1 cm abdominal aortic aneurysm with Dr. Scot Dock.  I was contacted this afternoon to ask about surgical plans tomorrow.  I went to meet [name] and he has very poor insight.  States he did not know he was having surgery tomorrow.  He seems very frail and is a poor historian.  States he has had a relationship with Dr. Scot Dock for 10 years.  Discussed we will keep him n.p.o. after midnight and I will notify Dr. Scot Dock of his admission and they can decide tomorrow morning about proceeding with surgery or not to repair his aneurysm.  In looking at the notes, it looks like he is also scheduled for bronchoscopy with EBUS in the afternoon with pulmonary.  Marty Heck, MD [facility] Office: [place]

## 2021-07-26 NOTE — Progress Notes (Signed)
Physical Therapy Treatment Patient Details Name: [name] MRN: [id number] DOB: 1945/11/07 Today's Date: 07/26/2021   History of Present Illness Pt is a 76 y/o male direct admit from Internal Medicine due to malnutrition and palpitable abdominal mass. CT pending. PMH includes HTN, AAA, A-fib, CAD, falls, pancreatic mass, GERD, and lung CA.    PT Comments    Pt received in supine, flat but participatory with encouragement and with fair tolerance for bed mobility and transfer training. Pt limited due to symptomatic orthostatic hypotension (see comments below, BP 122/59 supine and BP 83/52 standing, RN notified. Emphasis on supine/seated LE exercises, use of incentive spirometer with teach-back method, safe use of RW with transfers, benefits of OOB mobility and frequent repositioning for skin protection. Pt continues to benefit from PT services to progress toward functional mobility goals.   Recommendations for follow up therapy are one component of a multi-disciplinary discharge planning process, led by the attending physician.  Recommendations may be updated based on patient status, additional functional criteria and insurance authorization.  Follow Up Recommendations  Skilled nursing-short term rehab (<3 hours/day)     Assistance Recommended at Discharge Frequent or constant Supervision/Assistance  Patient can return home with the following A little help with walking and/or transfers;A little help with bathing/dressing/bathroom;Assistance with cooking/housework;Direct supervision/assist for medications management;Assist for transportation;Help with stairs or ramp for entrance   Equipment Recommendations  Rolling walker (2 wheels)    Recommendations for Other Services       Precautions / Restrictions Precautions Precautions: Fall Precaution Comments: symptomatic orthostatic hypotension 2/9 Restrictions Weight Bearing Restrictions: No     Mobility  Bed Mobility Overal bed  mobility: Needs Assistance Bed Mobility: Rolling, Sidelying to Sit, Sit to Supine Rolling: Min guard Sidelying to sit: Min guard   Sit to supine: Min guard   General bed mobility comments: VC's needed for patient to sit on EOB with feet flat against floor. Increased time needed to perform bed mobility tasks, cues for self-assist with bed rail; min guard for trunk stability upon rising.    Transfers Overall transfer level: Needs assistance Equipment used: Rolling walker (2 wheels) Transfers: Sit to/from Stand Sit to Stand: Min assist           General transfer comment: VCs for safe hand placement    Ambulation/Gait               General Gait Details: defer due to sx OH and fatigue and pt MD entering room to speak with him about imaging results at end of session   Stairs             Wheelchair Mobility    Modified Rankin (Stroke Patients Only)       Balance Overall balance assessment: Needs assistance Sitting-balance support: No upper extremity supported, Feet supported Sitting balance-Leahy Scale: Good   Postural control: Posterior lean Standing balance support: Bilateral upper extremity supported, Single extremity supported Standing balance-Leahy Scale: Poor Standing balance comment: able to stand with min guard and U UE support while BP checked                            Cognition Arousal/Alertness: Awake/alert Behavior During Therapy: Flat affect Overall Cognitive Status: No family/caregiver present to determine baseline cognitive functioning  General Comments: slow to respond to one step commands, pt appearing more flat, needs heavy encouragement today.        Exercises Other Exercises Other Exercises: supine BLE AROM: ankle pumps x10 reps (handout given to reinforce other supine exercises) Other Exercises: seated BUE AROM: rowing arms (pushing/pulling with minimal resistance) x10  reps Other Exercises: Incentive spirometer x5 reps (~250 mL) Other Exercises: static standing for BLE strengthening    General Comments General comments (skin integrity, edema, etc.): BP 122/59 (77) supine; BP 103/73 (83) seated EOB and BP 83/52 (63) standing at RW, RN/pulm MD (who was walking into room) notified; pulmonary PA verbal OK to obtain incentive spirometer for pt      Pertinent Vitals/Pain Pain Assessment Pain Assessment: Faces Faces Pain Scale: Hurts a little bit Pain Location: general Pain Descriptors / Indicators: Grimacing Pain Intervention(s): Limited activity within patient's tolerance, Monitored during session, Repositioned    Home Living                          Prior Function            PT Goals (current goals can now be found in the care plan section) Acute Rehab PT Goals Patient Stated Goal: to go home PT Goal Formulation: With patient Time For Goal Achievement: 08/06/21 Progress towards PT goals: Progressing toward goals    Frequency    Min 2X/week      PT Plan Current plan remains appropriate    Co-evaluation              AM-PAC PT "6 Clicks" Mobility   Outcome Measure  Help needed turning from your back to your side while in a flat bed without using bedrails?: A Little Help needed moving from lying on your back to sitting on the side of a flat bed without using bedrails?: A Little Help needed moving to and from a bed to a chair (including a wheelchair)?: A Lot (anticipate mod cues) Help needed standing up from a chair using your arms (e.g., wheelchair or bedside chair)?: A Little Help needed to walk in hospital room?: A Lot Help needed climbing 3-5 steps with a railing? : Total 6 Click Score: 14    End of Session Equipment Utilized During Treatment: Gait belt Activity Tolerance: Treatment limited secondary to medical complications (Comment);Patient limited by fatigue (symptomatic orthostatic hypotension) Patient left: in  bed;with call bell/phone within reach;with bed alarm set Nurse Communication: Mobility status;Other (comment) (sx orthostatic hypotension, pt IV beeping says it is complete) PT Visit Diagnosis: Unsteadiness on feet (R26.81);Muscle weakness (generalized) (M62.81);History of falling (Z91.81);Adult, failure to thrive (R62.7)     Time: 0350-0938 PT Time Calculation (min) (ACUTE ONLY): 24 min  Charges:  $Therapeutic Exercise: 8-22 mins $Therapeutic Activity: 8-22 mins                     Chyrel Taha P., PTA Acute Rehabilitation Services Pager: [phone] Office: [place] 07/26/2021, 4:48 PM

## 2021-07-26 NOTE — Care Management Important Message (Signed)
Important Message  Patient Details  Name: [name] MRN: [id number] Date of Birth: 1945/12/31   Medicare Important Message Given:  Yes     Hannah Beat 07/26/2021, 12:55 PM

## 2021-07-26 NOTE — Consult Note (Signed)
Palliative Care Consult Note                                  Date: 07/26/2021   Patient Name: [name]  DOB: 1945-09-02  MRN: [id number]  Age / Sex: 76 y.o., male  PCP: Axel Filler, MD Referring Physician: Aldine Contes, MD  Reason for Consultation: Establishing goals of care  HPI/Patient Profile: 76 y.o. male  with past medical history of pancreatic mass, Barrett's esophagus, prostate CA with BOO, lung Ca, HTN, HLD,  who presented with worsening weight loss and deconditioning and admit for dehydration and malignancy workup on hospital day 1.   Noted to have severe unintentional weight loss and severe malnutrition along with declining functional status.  Concern for pancreatic malignancy.  The patient also with a large AAA.  CT also finding lung nodule.    Given impending procedures, surgery, likely cancer journey PNT was consulted for goals of care.  Past Medical History:  Diagnosis Date   AAA (abdominal aortic aneurysm)    followed by vasc surgery   Alcohol abuse    Barretts esophagus    bx distal esophagus 2011 neg    Bladder outlet obstruction 04/02/2018   CAD in native artery    a. s/p stent to prox RCA and mid Cx.   Cataract    Cholelithiasis    Chronic osteomyelitis involving ankle and foot, right ([place]) 07/13/2018   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   2006 - 3 adenomas largest 12 mm  2008 - no polyps 2013-  2 mm cecal polyp, adenoma, routine repeat colonoscopy 02/2017 approx 05/14/2017 18 polyps max 12 mm ALL adenomas recall 2019 [name], MD, [facility]      Dieulafoy lesion of duodenum    Diverticulosis of colon    DJD (degenerative joint disease)    left hip   Full dentures    upper and lower   GERD (gastroesophageal reflux disease)    diet controlled    Gout    feet   Hiatal hernia    2 cm noted on upper endos 2011    Hx of adenomatous colonic polyps    Hyperlipidemia    Hypertension     Internal hemorrhoid    Lung cancer ([place])    squamous cell (T2N0MX), 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 pt was on Plavix x 3 years then transitioned to ASA, no problems since   Non-small cell carcinoma of right lung, stage 1 ([place]) 01/30/2016   Renal failure, acute ([facility])    hx of 2/2 medication, resolved   Tobacco abuse    smoker .5 ppd x 55 yrs   UTI (lower urinary tract infection)     Subjective:   This NP Walden Field reviewed medical records, received report from team, assessed the patient and then meet at the patient's bedside to discuss diagnosis, prognosis, GOC, EOL wishes disposition and options.  I met with the patient at the bedside.   Concept of Palliative Care was introduced as specialized medical care for people and their families living with serious illness.  If focuses on providing relief from the symptoms and stress of a serious illness.  The goal is to improve quality of life for both the patient and the family. Values and goals of care important to patient and family were attempted  to be elicited.  Created space and opportunity for patient  and family to explore thoughts and feelings regarding current medical situation   Natural trajectory and current clinical status were discussed. Questions and concerns addressed. Patient  encouraged to call with questions or concerns.    Patient/Family Understanding of Illness: He understands that "I am in trouble and I am sick.  They cannot do anything for me."  We further explored his current clinical situation with likely pancreatic cancer based on imaging but needing outpatient EUS with biopsy for confirmation.  We also discussed his significant weight loss, malnutrition, significant weakness as barriers to treatment.  Life Review: He worked all his life for [employer].  He notes that he has previously had lung cancer 12 to 13 years ago and prostate cancer  recently's status post TURP.  Both of these cancers have done well with surgical intervention, not requiring chemotherapy or radiation.  He enjoys reading the Bible.  He has a son and stepchildren, although not currently married.  He is spiritual and has fiber-based believes and reads the Bible daily.  He also notes a loose association with the [place].  Patient Values: Faith, family  Goals: To get surgery done, discharged to skilled nursing for rehab to try to get stronger, get cancer work-up and then take it 1 day at a time.  He notes that he is okay with surgeries and procedures.  Today's Discussion: In addition to extensive discussion about his current clinical situation we discussed risks and benefits of the planned surgical procedures (EVAR) and the likely bumpy road ahead with likely pancreatic malignancy.  We discussed his strong spiritual faith.  He reads the Bible every day and finds inspiration in this.  He would like visitation from the chaplain.  After prolonged discussion he decided that he is okay with undergoing procedures and surgeries, would like his cancer work-up to see what options may be available.  He understands he will likely need to be discharged to a skilled nursing facility for rehab in order to strengthen and improve his nutrition.  He understands that treatment options for cancer may be limited based on functional status.  I provided emotional general support through therapeutic listening, empathy, therapeutic silence, sharing of stories, and other techniques.  I answered all questions and addressed all concerns to the best of my ability.  Review of Systems  Constitutional:  Positive for appetite change and fatigue.  Respiratory:  Negative for cough, chest tightness and shortness of breath.   Gastrointestinal:  Negative for abdominal pain, nausea and vomiting.  Neurological:  Positive for weakness.   Objective:   Primary Diagnoses: Present on  Admission:  Severe malnutrition ([place])   Physical Exam Vitals and nursing note reviewed.  Constitutional:      General: He is not in acute distress.    Appearance: He is cachectic. He is ill-appearing. He is not toxic-appearing.  HENT:     Head: Normocephalic and atraumatic.  Cardiovascular:     Rate and Rhythm: Normal rate and regular rhythm.  Pulmonary:     Effort: Pulmonary effort is normal. No respiratory distress.     Breath sounds: No wheezing or rhonchi.  Abdominal:     General: Abdomen is flat.     Palpations: Abdomen is soft.  Skin:    General: Skin is warm and dry.  Neurological:     General: No focal deficit present.     Mental Status: He is alert.  Psychiatric:  Mood and Affect: Mood normal.        Behavior: Behavior normal.    Vital Signs:  BP 100/64 (BP Location: Right Arm)    Pulse 69    Temp 98.6 F (37 C) (Oral)    Resp 18    Ht 6' (1.829 m)    Wt 51.8 kg    SpO2 100%    BMI 15.49 kg/m   Palliative Assessment/Data: 40-50%    Advanced Care Planning:   Primary Decision Maker: PATIENT  Code Status/Advance Care Planning: Full code  A discussion was had today regarding advanced directives. Concepts specific to code status, artifical feeding and hydration, continued IV antibiotics and rehospitalization was had.  The difference between a aggressive medical intervention path and a palliative comfort care path for this patient at this time was had. The MOST form was introduced and discussed.  A MOST form was completed and scanned into the EMR/Vynca.  Decisions/Changes to ACP: Wishes to remain full code Full scope of treatment  Assessment & Plan:   Impression: Severely malnourished and cachectic 76 year old male with concerns for pancreatic malignancy and large AAA.  Planned vascular surgical intervention on his aneurysm, further cancer work-up including outpatient EUS, staging, and plans for available treatment options.  His treatment options may be  limited given his functional decline/functional status, malnutrition.  He is agreeable to surgery, procedures, full scope of care, skilled nursing facility admission for rehab.  SUMMARY OF RECOMMENDATIONS   Proceed with planned surgical interventions Remain full code Full scope of treatment/care Continue cancer work-up Likely discharge to skilled nursing facility for rehab Being that goals are clear PMT will follow peripherally Please contact us for further needs or further assistance needed  Symptom Management:  Per primary team PMT is available to assist as needed  Prognosis:  Unable to determine  Discharge Planning:  To Be Determined   Discussed with: Patient, medical team, nursing team    Thank you for allowing [place] to participate in the care of [name] PMT will continue to support holistically.  Time Total: 75 min  Greater than 50%  of this time was spent counseling and coordinating care related to the above assessment and plan.  Signed by: Walden Field, NP Palliative Medicine Team  Team Phone # [phone] (Nights/Weekends)  07/26/2021, 11:04 AM

## 2021-07-26 NOTE — Anesthesia Preprocedure Evaluation (Deleted)
Anesthesia Evaluation    Reviewed: Allergy & Precautions, Patient's Chart, lab work & pertinent test results  Airway Mallampati: II  TM Distance: >3 FB Neck ROM: Full    Dental  (+) Edentulous Upper, Edentulous Lower   Pulmonary COPD,  COPD inhaler, Current Smoker and Patient abstained from smoking.,  28 pack year history   Lung ca s/p lobectomy    Pulmonary exam normal breath sounds clear to auscultation       Cardiovascular hypertension, Pt. on medications + CAD, + Past MI, + Cardiac Stents (2004), + Peripheral Vascular Disease and +CHF (grade 1 diastolic dysfunction)  Normal cardiovascular exam+ dysrhythmias Atrial Fibrillation  Rhythm:Regular Rate:Normal  7/22 EST ? Nuclear stress EF: 47%. ? The left ventricular ejection fraction is mildly decreased (45-54%). ? There was no ST segment deviation noted during stress. ? No T wave inversion was noted during stress. ? The study is normal. ? This is a low risk study.   1.  Reduced apical counts with normal wall motion consistent with apical thinning artifact.  No evidence of ischemia or infarction. 2.  Normal LVEF for this modality, 47%. 3.  This is a low risk study.    2022: 1. Left ventricular ejection fraction, by estimation, is 55 to 60%. The  left ventricle has normal function. Left ventricular endocardial border  not optimally defined to evaluate regional wall motion. There is mild left  ventricular hypertrophy. Left  ventricular diastolic parameters are consistent with Grade I diastolic  dysfunction (impaired relaxation).  2. Right ventricular systolic function was not well visualized. The right  ventricular size is not well visualized. There is normal pulmonary artery  systolic pressure. The estimated right ventricular systolic pressure is  91.6 mmHg.  3. The mitral valve is normal in structure. No evidence of mitral valve  regurgitation. No evidence of mitral  stenosis.  4. The aortic valve is tricuspid. Aortic valve regurgitation is not  visualized. No aortic stenosis is present.  5. The inferior vena cava is normal in size with greater than 50%  respiratory variability, suggesting right atrial pressure of 3 mmHg.  6. Technically difficult study with poor acoustic windows.    Neuro/Psych negative psych ROS   GI/Hepatic hiatal hernia, GERD  Medicated and Controlled,(+)     substance abuse  alcohol use, Ampullary lesion   Endo/Other  negative endocrine ROS  Renal/GU Renal InsufficiencyRenal disease  negative genitourinary   Musculoskeletal  (+) Arthritis ,   Abdominal   Peds negative pediatric ROS (+)  Hematology  (+) Blood dyscrasia, anemia , Hb 10.9, plt 98   Anesthesia Other Findings   Reproductive/Obstetrics negative OB ROS                             Anesthesia Physical  Anesthesia Plan  ASA: 4  Anesthesia Plan: General   Post-op Pain Management: Tylenol PO (pre-op)   Induction: Intravenous  PONV Risk Score and Plan: 2 and Ondansetron and Dexamethasone  Airway Management Planned: Oral ETT  Additional Equipment: None and Arterial line  Intra-op Plan:   Post-operative Plan: Extubation in OR  Informed Consent:   Plan Discussed with: Anesthesiologist  Anesthesia Plan Comments:         Anesthesia Quick Evaluation

## 2021-07-26 NOTE — Progress Notes (Addendum)
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 Admitted with ongoing weight loss. We were called to see if EUS may help with further evaluation of pancreatic lesion(s) seen on MRI.  These lesions were also seen on MRI in May 2022 when we saw him for hospital consult for weight loss. Lesion felt unlikely to represent malignancy at that time. Repeat MRI this admission shows tiny T2 hyperintense lesis within the pancreatic head similar to previous MRI, but suboptimally evaluated due to motion artifact.  --Will discuss with out advanced biliary endoscopist whether EUS would be of value in further defining this lesion. We may require EUS anyway for ampullary lesion.  --CA 19-9 mildly elevated at 41 ( it was 113 8 months ago). CEA 4.8.    # Slow unintentional weight loss of  37 pounds over the last year . He appears to have chronic pancreatitis, probably has some degree of malabsorption though says he is compliant with pancreatic enzymes. Mainly he has a loss of appetite.  Solids can be challenging to swallow.   TSH 0.751  --Nutritionist has seen. Recommending Ensure TID, regular diet. SLP did bedside swallow evaluation done and overall his swallowing seems to be okay. He says that he is drinking some of the Ensure. He ate very little for lunch today. We started Remeron yesterday but he didn't want to take it     # Ampullary lesion. Abnormal ampullary tissue seen on EGD in May 2022 but unable to biopsy due to location. 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  --Once biopsy results back we will discuss options with our advanced biliary  endoscopist    # Infrarenal AAA ( 6.1 cm). Per admitting team he is scheduled to undergo EVAR on 2/10 however will need to be re-evaluated for medial clearance prior to undergoing this given his deteriorating status. Will need to discuss with VVS prior to discharge   # History of adenomatous colon polyps. Surveillance colonoscopy due May 2025   # Short segment Barrett's esophagus. Surveillance EGD was due in May 2025 but he has had an EGDs since that recall date was set so the date might end of being changed       SUBJECTIVE   Ate very small amount of lunch. No abdominal pain. Family visiting. In good spirits.       OBJECTIVE   EGD  -The examined portions of the nasopharynx, oropharynx and larynx were normal. - Normal esophagus. - A single lesion diagnostic of aberrant pancreas was found in the stomach. - Bilious gastric fluid. - Nodule found in the duodenum. Biopsied. 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 Biopsied.   Scheduled inpatient medications:   allopurinol  100 mg Oral Daily   aspirin EC  81 mg Oral Daily   Chlorhexidine Gluconate Cloth  6 each Topical Daily   feeding supplement  237 mL Oral TID BM   lipase/protease/amylase  24,000 Units Oral TID AC   mirtazapine  7.5 mg Oral QHS   rosuvastatin  20 mg Oral Daily   tamsulosin  0.8 mg Oral QPM   Continuous inpatient  infusions:  PRN inpatient medications: HYDROcodone-acetaminophen, ondansetron (ZOFRAN) IV  Vital signs in last 24 hours: Temp:  [97.5 F (36.4 C)-99.6 F (37.6 C)] 98.6 F (37 C) (02/09 0759) Pulse Rate:  [61-74] 69 (02/09 0759) Resp:  [16-18] 18 (02/09 0759) BP: (84-114)/(48-91) 100/64 (02/09 0759) SpO2:  [93 %-100 %] 100 % (02/09 0759) Last BM Date: 07/24/21  Intake/Output Summary (Last 24 hours) at 07/26/2021 1354 Last data filed at 07/26/2021 1100 Gross per 24 hour  Intake 276.86 ml  Output 800 ml  Net -523.14 ml     Physical Exam:  General: Alert male in  NAD Heart:  Regular rate. No lower extremity edema Pulmonary: Normal respiratory effort Abdomen: Soft, nondistended, nontender. Normal bowel sounds.  Neurologic: Alert and oriented Psych: Pleasant. Cooperative.   Filed Weights   07/24/21 0538 07/25/21 0829  Weight: 51.8 kg 51.8 kg    ntake/Output from previous day: 02/08 0701 - 02/09 0700 In: 486.9 [P.O.:100; I.V.:386.9] Out: 800 [Urine:800] Intake/Output this shift: Total I/O In: 240 [P.O.:240] Out: -    DIAGNOSTIC STUDIES THIS ADMISSION:  CT CHEST W CONTRAST  Result Date: 07/25/2021 CLINICAL DATA:  Unintended weight loss EXAM: CT CHEST WITH CONTRAST TECHNIQUE: Multidetector CT imaging of the chest was performed during intravenous contrast administration. RADIATION DOSE REDUCTION: This exam was performed according to the departmental dose-optimization program which includes automated exposure control, adjustment of the mA and/or kV according to patient size and/or use of iterative reconstruction technique. CONTRAST:  54mL OMNIPAQUE IOHEXOL 300 MG/ML  SOLN COMPARISON:  08/29/2020 FINDINGS: Cardiovascular: Coronary artery calcifications are seen. There is ectasia of main pulmonary artery measuring 3.9 cm. Ascending thoracic aorta at the level of main pulmonary artery measures 3.9 cm in diameter Mediastinum/Nodes: There is nodularity in the anterior margin of proximal right main bronchus. In the coronal reconstruction images, there is 1.4 cm soft tissue density in the proximal right main bronchus projecting into lower trachea. These findings appear more prominent. There are slightly enlarged lymph nodes in the right hilum. Thyroid is not enlarged. There is inhomogeneous attenuation in thyroid. Lungs/Pleura: Centrilobular emphysema is seen. There are few blebs in the apices. There is no focal pulmonary consolidation. There is no pleural effusion or pneumothorax. Upper Abdomen: Gallbladder stones are seen. There is fatty liver. There are smooth  marginated low-density lesions in the visualized portions of both kidneys largest measuring 5.2 cm in diameter in the upper pole of right kidney. Musculoskeletal: Degenerative changes are noted with spinal stenosis at L2-L3 level. Deformity is seen in multiple right ribs with no significant interval change, possibly congenital variation or residual change from previous trauma. There is ectasia of main pulmonary artery suggesting pulmonary arterial hypertension. There is ectasia in thoracic aorta. Extensive coronary artery calcifications are seen. IMPRESSION: There is nodularity in the anterior margin of proximal right main bronchus. There is possible 1.4 cm soft tissue density in the proximal right main bronchus projecting into the lower trachea. Findings may suggest scarring or neoplastic process. PET-CT and bronchoscopy if warranted should be considered. There are slightly enlarged lymph nodes in the right hilum with no significant interval change, possibly suggesting reactive hyperplasia. There is no focal pulmonary consolidation. COPD. There are no discrete lung nodules. There is no pleural effusion or pneumothorax. There is ectasia of main pulmonary artery suggesting pulmonary arterial hypertension. There is ectasia of ascending thoracic aorta. Coronary artery calcifications are seen. Calcified gallbladder stone. Bilateral renal cysts. Lumbar spondylosis, particularly severe at L2-L3 level with spinal stenosis. Other findings  as described in the body of the report. Electronically Signed   By: Elmer Picker M.D.   On: 07/25/2021 19:04       Lab Results: Recent Labs    07/24/21 0343 07/25/21 0318 07/26/21 0131  WBC 3.2* 3.3* 5.7  HGB 10.3* 10.9* 10.6*  HCT 33.4* 36.0* 33.0*  PLT 95* 98* 85*   BMET Recent Labs    07/24/21 0343 07/25/21 0318 07/26/21 0131  NA 139 138 134*  K 3.8 3.6 3.7  CL 102 101 100  CO2 28 27 27   GLUCOSE 79 79 105*  BUN 18 17 13   CREATININE 1.03 0.94 0.85   CALCIUM 9.5 9.6 9.1   LFT Recent Labs    07/26/21 0131  PROT 5.7*  ALBUMIN 2.4*  AST 21  ALT 10  ALKPHOS 39  BILITOT 0.7   PT/INR No results for input(s): LABPROT, INR in the last 72 hours. Hepatitis Panel No results for input(s): HEPBSAG, HCVAB, HEPAIGM, HEPBIGM in the last 72 hours.    Principal Problem:   Severe malnutrition ([place]) Active Problems:   Neoplasm of ampulla     LOS: 3 days   Tye Savoy ,NP 07/26/2021, 1:54 PM   I have taken a history, reviewed the chart and examined the patient. I performed a substantive portion of this encounter, including complete performance of at least one of the key components, in conjunction with the APP. I agree with the APP's note, impression and recommendations  76 year old male with multiple medical problems admitted with ongoing weight loss, weakness, failure to thrive, concerning for malignancy.  He has had EGD/Colonoscopy and CT abd pelvis negative for GI malignancy.  He has chronic microcystic changes that have been stable and are very unlikely to present pancreatic malignancy.  He has a prominent ampulla with mucosal changes suspicious for an ampullary adenoma.  Biopsies are pending.  This did not appear malignant and would be unlikely to explain malignancy.  Once biopsies are back, Dr. Rush Landmark can review the case and discuss whether EUS/ampullectomy is necessary.  It will be a technically difficult procedure due to the patient's J-shaped stomach (a colonoscope was used to access the second portion of the duodenum).  The patient's enlarging abdominal aortic aneurysm likely represents a more serious threat to his health and a decision regarding treatment will hopefully be made soon. He also had a suspicious lung finding (RT main bronchus) and PET vs bronchoscopy was recommended.   Recommend continued supportive care with nutrition counseling and mirtazepine to help appetite.  No further recommendations at this time.  Will  follow up with patient once biopsies return.  Atif Chapple E. Candis Schatz, MD [facility]

## 2021-07-26 NOTE — Consult Note (Signed)
NAME:  [name], MRN:  [id number], DOB:  02/19/1946, LOS: 3 ADMISSION DATE:  07/23/2021, CONSULTATION DATE:  2/9 REFERRING MD:  Dr. 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  Patient states he has had unintentional weight loss over the past year.  Has lost about 37 pounds.  States he has a lack of appetite.  Denies night sweats, N/V/D/C, fever, chills, SOB.  On 2/6 patient presents to [facility] ED for worsening generalized weakness.  MRI shows pancreatic lesion.  GI consulted.  May require EUS for ampullary lesion.  CA 19-9 mildly elevated at 41.  CEA 4.8.  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  History of adenomatous colon polyps (last colonoscopy May 2025).  CT chest performed 2/8 showing 1.4 cm pulmonary nodule proximal R main bronchus; slightly enlarged lymph nodes in R hilum.   PCCM consulted for possible bronchoscopy with biopsy.  Past Medical History:  Diagnosis Date   AAA (abdominal aortic aneurysm)    followed by vasc surgery   Alcohol abuse    Barretts esophagus    bx distal esophagus 2011 neg    Bladder outlet obstruction 04/02/2018   CAD in native artery    a. s/p stent to prox RCA and mid Cx.   Cataract    Cholelithiasis    Chronic osteomyelitis involving ankle and foot, right ([place]) 07/13/2018   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   2006 - 3 adenomas largest 12 mm  2008 - no polyps 2013-  2 mm cecal polyp, adenoma, routine repeat colonoscopy 02/2017 approx 05/14/2017 18 polyps max 12 mm ALL adenomas recall 2019 [name], MD, [facility]      Dieulafoy lesion of duodenum    Diverticulosis of colon    DJD (degenerative joint disease)    left hip   Full  dentures    upper and lower   GERD (gastroesophageal reflux disease)    diet controlled    Gout    feet   Hiatal hernia    2 cm noted on upper endos 2011    Hx of adenomatous colonic polyps    Hyperlipidemia    Hypertension    Internal hemorrhoid    Lung cancer ([place])    squamous cell ([id number]), 11/07- surgically treated, no chemo/XRT   Myocardial infarction ([place]) 2004   hx of, 2 stents 2004. pt was on Plavix x 3 years then transitioned to ASA, no problems since   Non-small cell carcinoma of right lung, stage 1 ([place]) 01/30/2016   Renal failure, acute (HCC)    hx of 2/2 medication, resolved   Tobacco abuse    smoker .5 ppd x 55 yrs   UTI (lower urinary tract infection)      Significant Hospital Events: Including procedures, antibiotic start and stop dates in addition to other pertinent events   2/6 admitted w/ severe malnutrition  Interim History / Subjective:  Patient has no complaints Denies any respiratory symptoms  Objective   Blood pressure 100/64, pulse 69, temperature 98.6 F (37 C), temperature source Oral, resp. rate 18, height 6' (1.829 m), weight 51.8 kg, SpO2 100 %.        Intake/Output Summary (  Last 24 hours) at 07/26/2021 1528 Last data filed at 07/26/2021 1100 Gross per 24 hour  Intake 276.86 ml  Output 800 ml  Net -523.14 ml   Filed Weights   07/24/21 0538 07/25/21 0829  Weight: 51.8 kg 51.8 kg    Examination: General:  cachectic elderly male in NAD HEENT: MM pink/moist Neuro: Aox3; MAE CV: s1s2, no m/r/g PULM:  dim clear BS bilaterally; on room air GI: soft, bsx4 active  Extremities: warm/dry, no edema  Skin: no rashes or lesions appreciated  CT chest 2/8: 1.4 cm pulmonary nodule proximal R main bronchus; slightly enlarged lymph nodes in R hilum  Resolved Hospital Problem list     Assessment & Plan:  1.4 cm pulmonary nodule proximal R main bronchus: CT chest 2/8 History of stage 1 squamouns cell lung cancer of the RLL: surgically removed  11/07; no chemo/XRT Hx of tobacco abuse P: -will need bronchoscopy w/ biopsy -patient consented to procedure -EBUS scheduled w/ Dr. 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) Prostate cancer s/p TURP, history of BOO Pancreatic insufficiency CAD, history of MI  Chronic thrombocytopenia Chronic normocytic anemia P: -per primary  Best Practice (right click and "Reselect all SmartList Selections" daily)   Per primary  Labs   CBC: Recent Labs  Lab 07/23/21 1130 07/24/21 0343 07/25/21 0318 07/26/21 0131  WBC 3.6* 3.2* 3.3* 5.7  NEUTROABS 2.4  --   --   --   HGB 12.4* 10.3* 10.9* 10.6*  HCT 40.8 33.4* 36.0* 33.0*  MCV 88.9 87.7 88.9 86.8  PLT 108* 95* 98* 85*    Basic Metabolic Panel: Recent Labs  Lab 07/23/21 1130 07/24/21 0343 07/25/21 0318 07/26/21 0131  NA 138 139 138 134*  K 4.6 3.8 3.6 3.7  CL 100 102 101 100  CO2 28 28 27 27   GLUCOSE 96 79 79 105*  BUN 22 18 17 13   CREATININE 1.16 1.03 0.94 0.85  CALCIUM 9.8 9.5 9.6 9.1  MG 2.3  --   --   --   PHOS 2.5  --   --   --    GFR: Estimated Creatinine Clearance: 55 mL/min (by C-G formula based on SCr of 0.85 mg/dL). Recent Labs  Lab 07/23/21 1130 07/24/21 0343 07/25/21 0318 07/26/21 0131  WBC 3.6* 3.2* 3.3* 5.7    Liver Function Tests: Recent Labs  Lab 07/23/21 1130 07/25/21 0318 07/26/21 0131  AST 29 22 21   ALT 13 13 10   ALKPHOS 49 48 39  BILITOT 1.6* 0.5 0.7  PROT 6.9 6.5 5.7*  ALBUMIN 3.1* 2.8* 2.4*   No results for input(s): LIPASE, AMYLASE in the last 168 hours. No results for input(s): AMMONIA in the last 168 hours.  ABG    Component Value Date/Time   TCO2 25 03/06/2021 1226     Coagulation Profile: Recent Labs   Lab 07/23/21 1130  INR 1.1    Cardiac Enzymes: No results for input(s): CKTOTAL, CKMB, CKMBINDEX, TROPONINI in the last 168 hours.  HbA1C: Hgb A1c MFr Bld  Date/Time Value Ref Range Status  04/27/2021 08:48 AM 5.5 4.8 - 5.6 % Final    Comment:    (NOTE) Pre diabetes:          5.7%-6.4%  Diabetes:              >  6.4%  Glycemic control for   <7.0% adults with diabetes   11/07/2020 06:18 AM 5.4 4.8 - 5.6 % Final    Comment:    (NOTE)         Prediabetes: 5.7 - 6.4         Diabetes: >6.4         Glycemic control for adults with diabetes: <7.0     CBG: Recent Labs  Lab 07/24/21 1116 07/25/21 0740  GLUCAP 96 105*    Review of Systems:   Patient poor historian. Information obtained from chart review.  Past Medical History:  He,  has a past medical history of AAA (abdominal aortic aneurysm), Alcohol abuse, Barretts esophagus, Bladder outlet obstruction (04/02/2018), CAD in native artery, Cataract, Cholelithiasis, Chronic osteomyelitis involving ankle and foot, right ([place]) (07/13/2018), Colon polyposis - attenuated (04/18/2006), Dieulafoy lesion of duodenum, Diverticulosis of colon, DJD (degenerative joint disease), Full dentures, GERD (gastroesophageal reflux disease), Gout, Hiatal hernia, adenomatous colonic polyps, Hyperlipidemia, Hypertension, Internal hemorrhoid, Lung cancer ([place]), Myocardial infarction ([place]) (2004), Non-small cell carcinoma of right lung, stage 1 ([place]) (01/30/2016), Renal failure, acute ([place]), Tobacco abuse, and UTI (lower urinary tract infection).   Surgical History:   Past Surgical History:  Procedure Laterality Date   BIOPSY  11/12/2020   Procedure: BIOPSY;  Surgeon: Doran Stabler, MD;  Location: [facility];  Service: Gastroenterology;;   cataract surgery Bilateral 2018   removal of cataracts   COLONOSCOPY     hx polyps - [name] 02/2012   COLONOSCOPY WITH PROPOFOL N/A 11/12/2020   Procedure: COLONOSCOPY WITH PROPOFOL;  Surgeon: Doran Stabler, MD;  Location: [facility];  Service: Gastroenterology;  Laterality: N/A;   CORONARY ANGIOPLASTY WITH STENT PLACEMENT     ESOPHAGOGASTRODUODENOSCOPY (EGD) WITH PROPOFOL N/A 11/12/2020   Procedure: ESOPHAGOGASTRODUODENOSCOPY (EGD) WITH PROPOFOL;  Surgeon: Doran Stabler, MD;  Location: [facility];  Service: Gastroenterology;  Laterality: N/A;   ESOPHAGOGASTRODUODENOSCOPY (EGD) WITH PROPOFOL N/A 03/06/2021   Procedure: ESOPHAGOGASTRODUODENOSCOPY (EGD) WITH PROPOFOL;  Surgeon: Yetta Flock, MD;  Location: [facility];  Service: Gastroenterology;  Laterality: N/A;   flexible cystourethroscopy  05/24/06   HEMOSTASIS CLIP PLACEMENT  03/06/2021   Procedure: HEMOSTASIS CLIP PLACEMENT;  Surgeon: Yetta Flock, MD;  Location: [facility];  Service: Gastroenterology;;   HOT HEMOSTASIS N/A 03/06/2021   Procedure: HOT HEMOSTASIS (ARGON PLASMA COAGULATION/BICAP);  Surgeon: Yetta Flock, MD;  Location: [facility];  Service: Gastroenterology;  Laterality: N/A;   INGUINAL HERNIA REPAIR  2005   LUNG LOBECTOMY Right 11/07   RUL   MULTIPLE TOOTH EXTRACTIONS     POLYPECTOMY  11/12/2020   Procedure: POLYPECTOMY;  Surgeon: Doran Stabler, MD;  Location: [facility];  Service: Gastroenterology;;   TRANSURETHRAL RESECTION OF PROSTATE N/A 05/02/2021   Procedure: TRANSURETHRAL RESECTION OF THE PROSTATE (TURP);  Surgeon: Lucas Mallow, MD;  Location: [facility];  Service: Urology;  Laterality: N/A;   UPPER GASTROINTESTINAL ENDOSCOPY       Social History:   reports that he has been smoking cigarettes. He has a 27.50 pack-year smoking history. He has never used smokeless tobacco. He reports that he does not currently use alcohol. He reports that he does not use drugs.   Family History:  His family history includes Arthritis in his brother; Diabetes in his mother and sister; Gout in his brother; Heart disease in his sister; Hypertension in his sister. There is no history of Colon  cancer, Esophageal cancer, Rectal  cancer, Stomach cancer, or Colon polyps.   Allergies Allergies  Allergen Reactions   Candesartan Cilexetil-Hctz Other (See Comments)    Acute renal failure   Hydrochlorothiazide Other (See Comments)    Acute renal failure   Shellfish Allergy Other (See Comments)    Gout occurs     Home Medications  Prior to Admission medications   Medication Sig Start Date End Date Taking? Authorizing Provider  acetaminophen (TYLENOL 8 HOUR) 650 MG CR tablet Take 1 tablet (650 mg total) by mouth every 8 (eight) hours as needed for pain or fever. 06/19/21  Yes Nanavati, Ankit, MD  albuterol (VENTOLIN HFA) 108 (90 Base) MCG/ACT inhaler INHALE 1-2 PUFFS BY MOUTH EVERY 6 HOURS AS NEEDED FOR WHEEZE OR SHORTNESS OF BREATH Patient taking differently: Inhale 1-2 puffs into the lungs every 6 (six) hours as needed for wheezing or shortness of breath. 07/12/21  Yes Axel Filler, MD  allopurinol (ZYLOPRIM) 100 MG tablet Take 100 mg by mouth daily.   Yes [provider]  amLODipine (NORVASC) 10 MG tablet Take 10 mg by mouth daily.   Yes [provider]  escitalopram (LEXAPRO) 5 MG tablet Take 1 tablet (5 mg total) by mouth daily. 07/11/21 01/07/22 Yes Rick Duff, MD  Ferrous Sulfate (IRON PO) Take 1 tablet by mouth daily.   Yes [provider]  HYDROcodone-acetaminophen (NORCO) 7.5-325 MG tablet Take 1 tablet by mouth 2 (two) times daily as needed for moderate pain. 07/13/21  Yes Axel Filler, MD  Multiple Vitamin (MULTIVITAMIN WITH MINERALS) TABS tablet Take 1 tablet by mouth daily.   Yes [provider]  Pancrelipase, Lip-Prot-Amyl, (ZENPEP) 20000-63000 units CPEP Take 3 capsules by mouth 3 (three) times daily before meals. 06/29/21  Yes Axel Filler, MD  pantoprazole (PROTONIX) 40 MG tablet Take 1 tablet (40 mg total) by mouth daily. 04/04/21 07/23/21 Yes Axel Filler, MD  rosuvastatin (CRESTOR) 20 MG tablet  Take 1 tablet (20 mg total) by mouth daily. 06/14/21  Yes Axel Filler, MD  senna (SENOKOT) 8.6 MG TABS tablet Take 2 tablets (17.2 mg total) by mouth in the morning and at bedtime. Patient taking differently: Take 1 tablet by mouth daily as needed for mild constipation. 06/29/21  Yes Axel Filler, MD  tamsulosin (FLOMAX) 0.4 MG CAPS capsule Take 2 capsules (0.8 mg total) by mouth every evening. 11/27/20  Yes Axel Filler, MD  vitamin C (ASCORBIC ACID) 500 MG tablet Take 500 mg by mouth daily.   Yes [provider]  metoprolol succinate (TOPROL-XL) 100 MG 24 hr tablet Take 100 mg by mouth daily. Take with or immediately following a meal.    [provider]         JD Rexene Agent  Pulmonary & Critical Care 07/26/2021, 3:28 PM  Please see [email or web address] for pager details.  From 7A-7P if no response, please call [phone]. After hours, please call ELink [phone].

## 2021-07-26 NOTE — Progress Notes (Signed)
Nutrition Follow-up  DOCUMENTATION CODES:  Severe malnutrition in context of social or environmental circumstances, Underweight  INTERVENTION:  Continue regular diet, encourage PO intake Continue Ensure Enlive po TID, each supplement provides 350 kcal and 20 grams of protein.  NUTRITION DIAGNOSIS:  Severe Malnutrition (in the context of social and environmental circumstances) related to poor appetite as evidenced by severe muscle depletion, severe fat depletion, percent weight loss (17.4% x 3 months). - ongoing  GOAL:  Patient will meet greater than or equal to 90% of their needs, Weight gain - regular diet, supplements in place  MONITOR:  PO intake, Supplement acceptance  REASON FOR ASSESSMENT:  Consult Assessment of nutrition requirement/status  ASSESSMENT:  76 year old male with hx of gout, HTN, HLD, barretts esophagus, tobacco abuse, EtOH abuse, full dentures, lung cancer, GERD, hx of MI, and CAD presented to ED from PCP office for declining functional status and unintentional weight loss  2/8 - Upper edoscopy, findings: A single lesion diagnostic of aberrant pancreas was found in the stomach. Nodule found in the duodenum. 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   Pt resting in bed at the time of assessment. States that he has been eating "some" which is an improvement as he was previously refusing meals. Pt reports that he is also consuming his ensures consistently 3x/d. Will continue to monitor. Noted some weight gain since last assessment, like the result of re-hydration.  Average Meal Intake: 2/6-2/9: 20% intake x 3 recorded meals  Nutritionally Relevant Medications: Scheduled Meds:  feeding supplement  237 mL Oral TID BM   lipase/protease/amylase  24,000 Units Oral TID AC   mirtazapine  7.5 mg Oral QHS   rosuvastatin  20 mg Oral Daily   PRN Meds: ondansetron   Labs Reviewed: Sodium 134 CRP 2.0   NUTRITION - FOCUSED PHYSICAL  EXAM: Flowsheet Row Most Recent Value  Orbital Region Severe depletion  Upper Arm Region Severe depletion  Thoracic and Lumbar Region Severe depletion  Buccal Region Severe depletion  Temple Region Severe depletion  Clavicle Bone Region Severe depletion  Clavicle and Acromion Bone Region Severe depletion  Scapular Bone Region Severe depletion  Dorsal Hand Severe depletion  Patellar Region Severe depletion  Anterior Thigh Region Severe depletion  Posterior Calf Region Severe depletion  Edema (RD Assessment) None  Hair Reviewed  Eyes Reviewed  Mouth Reviewed  Skin Reviewed  Nails Reviewed   Diet Order:   Diet Order             Diet NPO time specified  Diet effective midnight           Diet regular Room service appropriate? Yes; Fluid consistency: Thin  Diet effective now                   EDUCATION NEEDS:  No education needs have been identified at this time  Skin:  Skin Assessment: Reviewed RN Assessment  Last BM:  2/7   Height:  Ht Readings from Last 1 Encounters:  07/25/21 6' (1.829 m)    Weight:  Wt Readings from Last 1 Encounters:  07/25/21 51.8 kg    Ideal Body Weight:  89.1 kg  BMI:  Body mass index is 15.49 kg/m.  Estimated Nutritional Needs:  Kcal:  1800-2000 kcal/d Protein:  90-100 g/d Fluid:  1.8-2 L/d   Ranell Patrick, RD, LDN Clinical Dietitian RD pager # available in [place]  After hours/weekend pager # available in [facility]

## 2021-07-26 NOTE — Progress Notes (Signed)
HD#3 SUBJECTIVE:  Patient Summary: [name] is a 76 y.o. with pertinent PMH of pancreatic mass, Barrett's esophagus, prostate CA with BOO, lung Ca, HTN, HLD,  who presented with worsening weight loss and deconditioning and admit for dehydration and malignancy workup on hospital day 1  Overnight Events: no acute events overnight    Interm History: Patient seen and evaluated at bedsdie. Says he feels okay this moring. He tolerated EGD well yesterday. Asking about CT scan results. Discussed findings with im regarding new nodule. Patient non-committal about have EVAR done. Told him we will discuss with vascular and pulm about future workup.  OBJECTIVE:  Vital Signs: Vitals:   07/25/21 2119 07/25/21 2307 07/26/21 0700 07/26/21 0759  BP: (!) 84/54 94/70 (!) 94/48 100/64  Pulse: 62 70 61 69  Resp: 16 16 17 18   Temp: 98.1 F (36.7 C) 98.4 F (36.9 C) 99.6 F (37.6 C) 98.6 F (37 C)  TempSrc: Oral Oral Oral Oral  SpO2: 100% 100% 100% 100%  Weight:      Height:       Supplemental O2: Room Air SpO2: 100 % O2 Flow Rate (L/min): 2 L/min  Filed Weights   07/24/21 0538 07/25/21 0829  Weight: 51.8 kg 51.8 kg     Intake/Output Summary (Last 24 hours) at 07/26/2021 1454 Last data filed at 07/26/2021 1100 Gross per 24 hour  Intake 276.86 ml  Output 800 ml  Net -523.14 ml   Net IO Since Admission: 1,924.92 mL [07/26/21 1454]  Physical Exam: General: chronically ill, catechetic appearing Eyes: no scleral icterus or conjunctival injection.  Cardiac: regular rate and rhythm, no lower extremity edema Pulm: breathing comfortably on room air, lung sounds are clear GI: abdomen soft, non-tender, non-distended. Hypoactive bowel sounds.  MSK: significant muscle atrophy throughout, generalized weakness Neuro: nonfocal Skin: no rash or lesion on limited exam  Patient Lines/Drains/Airways Status     Active Line/Drains/Airways     Name Placement date Placement time Site Days    Peripheral IV 07/23/21 22 G 1.75" Anterior;Right Forearm 07/23/21  1206  Forearm  1            Pertinent Labs: CBC Latest Ref Rng & Units 07/26/2021 07/25/2021 07/24/2021  WBC 4.0 - 10.5 K/uL 5.7 3.3(L) 3.2(L)  Hemoglobin 13.0 - 17.0 g/dL 10.6(L) 10.9(L) 10.3(L)  Hematocrit 39.0 - 52.0 % 33.0(L) 36.0(L) 33.4(L)  Platelets 150 - 400 K/uL 85(L) 98(L) 95(L)    CMP Latest Ref Rng & Units 07/26/2021 07/25/2021 07/24/2021  Glucose 70 - 99 mg/dL 105(H) 79 79  BUN 8 - 23 mg/dL 13 17 18   Creatinine 0.61 - 1.24 mg/dL 0.85 0.94 1.03  Sodium 135 - 145 mmol/L 134(L) 138 139  Potassium 3.5 - 5.1 mmol/L 3.7 3.6 3.8  Chloride 98 - 111 mmol/L 100 101 102  CO2 22 - 32 mmol/L 27 27 28   Calcium 8.9 - 10.3 mg/dL 9.1 9.6 9.5  Total Protein 6.5 - 8.1 g/dL 5.7(L) 6.5 -  Total Bilirubin 0.3 - 1.2 mg/dL 0.7 0.5 -  Alkaline Phos 38 - 126 U/L 39 48 -  AST 15 - 41 U/L 21 22 -  ALT 0 - 44 U/L 10 13 -    Recent Labs    07/24/21 1116 07/25/21 0740  GLUCAP 96 105*     Pertinent Imaging: CT CHEST W CONTRAST  Result Date: 07/25/2021 CLINICAL DATA:  Unintended weight loss EXAM: CT CHEST WITH CONTRAST TECHNIQUE: Multidetector CT imaging of the chest was  performed during intravenous contrast administration. RADIATION DOSE REDUCTION: This exam was performed according to the departmental dose-optimization program which includes automated exposure control, adjustment of the mA and/or kV according to patient size and/or use of iterative reconstruction technique. CONTRAST:  58mL OMNIPAQUE IOHEXOL 300 MG/ML  SOLN COMPARISON:  08/29/2020 FINDINGS: Cardiovascular: Coronary artery calcifications are seen. There is ectasia of main pulmonary artery measuring 3.9 cm. Ascending thoracic aorta at the level of main pulmonary artery measures 3.9 cm in diameter Mediastinum/Nodes: There is nodularity in the anterior margin of proximal right main bronchus. In the coronal reconstruction images, there is 1.4 cm soft tissue density in the  proximal right main bronchus projecting into lower trachea. These findings appear more prominent. There are slightly enlarged lymph nodes in the right hilum. Thyroid is not enlarged. There is inhomogeneous attenuation in thyroid. Lungs/Pleura: Centrilobular emphysema is seen. There are few blebs in the apices. There is no focal pulmonary consolidation. There is no pleural effusion or pneumothorax. Upper Abdomen: Gallbladder stones are seen. There is fatty liver. There are smooth marginated low-density lesions in the visualized portions of both kidneys largest measuring 5.2 cm in diameter in the upper pole of right kidney. Musculoskeletal: Degenerative changes are noted with spinal stenosis at L2-L3 level. Deformity is seen in multiple right ribs with no significant interval change, possibly congenital variation or residual change from previous trauma. There is ectasia of main pulmonary artery suggesting pulmonary arterial hypertension. There is ectasia in thoracic aorta. Extensive coronary artery calcifications are seen. IMPRESSION: There is nodularity in the anterior margin of proximal right main bronchus. There is possible 1.4 cm soft tissue density in the proximal right main bronchus projecting into the lower trachea. Findings may suggest scarring or neoplastic process. PET-CT and bronchoscopy if warranted should be considered. There are slightly enlarged lymph nodes in the right hilum with no significant interval change, possibly suggesting reactive hyperplasia. There is no focal pulmonary consolidation. COPD. There are no discrete lung nodules. There is no pleural effusion or pneumothorax. There is ectasia of main pulmonary artery suggesting pulmonary arterial hypertension. There is ectasia of ascending thoracic aorta. Coronary artery calcifications are seen. Calcified gallbladder stone. Bilateral renal cysts. Lumbar spondylosis, particularly severe at L2-L3 level with spinal stenosis. Other findings as  described in the body of the report. Electronically Signed   By: Elmer Picker M.D.   On: 07/25/2021 19:04    ASSESSMENT/PLAN:  Assessment: Principal Problem:   Severe malnutrition ([place]) Active Problems:   Neoplasm of ampulla   BENIGNO CHECK is a 76 y.o. with pertinent PMH of pancreatic mass, Barrett's esophagus, prostate CA with BOO, lung Ca, HTN, HLD,  who presented with worsening weight loss and deconditioning and admit for dehydration and malignancy workup on hospital day 3  Unintentional weight loss, severe malnutrition, declining functional status Suspected underlying malignancy given 40lb weight loss over the past year with deconditioning and decline in functional status. . CEA and CA19-9, and LDH all elevated. Patient had MRCP performed  limited by movement artefact, however did show some hyperintensity in pancreatitic head on imaging but these were non-diagnostic.  - GI performed EGD which showed bulging ampulla with abnormal mucosa suspicious for adenoma. Recommending outpatient EUS for further investigation. He has been started on mirtazapine to stimulate appetite.   - CT scan of abd was performed yesterday and showed 1.4 cm lesion suspicious for either scar or cancer with recommended PET CT or bronch with bx. Discussed with pulm who is planning to evaluate  the patient.  - Palliative was asked to evaluate the patient given poor overall health and multiple comorbidities including several high risk findings requiring procedures.  Patient has elected to continue management and proceed with treatments as he is able. MOST form has been completed.  - He will likely need SNF on discharge given significant muscle wasting.    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 he is scheduled to undergo EVAR on 2/10.  - NPO at midnight pending vascular decision to proceed with procedure.   Prostate cancer s/p TURP, history of BOO - patient has been retaining urine. Resistance noted when I+O performed. Coude  cath  placed.    Pancreatic insufficiency.  - Continue enzyme replacement.    CAD, history of MI (2004).  - Continue statin and aspirin.   Chronic thrombocytopenia.  - Stable. Continue to monitor.    Chronic normocytic anemia.  - Stable. Continue to monitor  Best Practice: Diet: NPO at midnight DISPO: Anticipated discharge pending Medical stability.  Signature: Delene Ruffini, MD  Internal Medicine Resident, PGY-1 [facility]  Pager: [phone] 2:54 PM, 07/26/2021   Please contact the on call pager after 5 pm and on weekends at [phone].

## 2021-07-27 ENCOUNTER — Encounter (HOSPITAL_COMMUNITY): Payer: Self-pay | Admitting: Internal Medicine

## 2021-07-27 ENCOUNTER — Inpatient Hospital Stay (HOSPITAL_COMMUNITY): Payer: Medicare HMO | Admitting: Certified Registered Nurse Anesthetist

## 2021-07-27 ENCOUNTER — Encounter (HOSPITAL_COMMUNITY)
Admission: RE | Disposition: A | Discharge status: Skilled Nursing Facility | Payer: Self-pay | Source: Home / Self Care | Attending: Internal Medicine

## 2021-07-27 ENCOUNTER — Inpatient Hospital Stay (HOSPITAL_COMMUNITY): Admission: RE | Admit: 2021-07-27 | Payer: Medicare HMO | Source: Home / Self Care | Admitting: Vascular Surgery

## 2021-07-27 DIAGNOSIS — I2510 Atherosclerotic heart disease of native coronary artery without angina pectoris: Secondary | ICD-10-CM

## 2021-07-27 DIAGNOSIS — E43 Unspecified severe protein-calorie malnutrition: Secondary | ICD-10-CM | POA: Diagnosis not present

## 2021-07-27 DIAGNOSIS — R9389 Abnormal findings on diagnostic imaging of other specified body structures: Secondary | ICD-10-CM | POA: Diagnosis not present

## 2021-07-27 DIAGNOSIS — J9809 Other diseases of bronchus, not elsewhere classified: Secondary | ICD-10-CM

## 2021-07-27 DIAGNOSIS — I130 Hypertensive heart and chronic kidney disease with heart failure and stage 1 through stage 4 chronic kidney disease, or unspecified chronic kidney disease: Secondary | ICD-10-CM

## 2021-07-27 DIAGNOSIS — I5030 Unspecified diastolic (congestive) heart failure: Secondary | ICD-10-CM

## 2021-07-27 HISTORY — PX: BRONCHIAL BRUSHINGS: SHX5108

## 2021-07-27 HISTORY — PX: VIDEO BRONCHOSCOPY: SHX5072

## 2021-07-27 HISTORY — PX: BRONCHIAL BIOPSY: SHX5109

## 2021-07-27 LAB — CBC
HCT: 29.4 % — ABNORMAL LOW (ref 39.0–52.0)
Hemoglobin: 9.3 g/dL — ABNORMAL LOW (ref 13.0–17.0)
MCH: 27.8 pg (ref 26.0–34.0)
MCHC: 31.6 g/dL (ref 30.0–36.0)
MCV: 88 fL (ref 80.0–100.0)
Platelets: 78 10*3/uL — ABNORMAL LOW (ref 150–400)
RBC: 3.34 MIL/uL — ABNORMAL LOW (ref 4.22–5.81)
RDW: 17.4 % — ABNORMAL HIGH (ref 11.5–15.5)
WBC: 2.7 10*3/uL — ABNORMAL LOW (ref 4.0–10.5)
nRBC: 0 % (ref 0.0–0.2)

## 2021-07-27 LAB — COMPREHENSIVE METABOLIC PANEL
ALT: 20 U/L (ref 0–44)
AST: 32 U/L (ref 15–41)
Albumin: 2.3 g/dL — ABNORMAL LOW (ref 3.5–5.0)
Alkaline Phosphatase: 54 U/L (ref 38–126)
Anion gap: 7 (ref 5–15)
BUN: 21 mg/dL (ref 8–23)
CO2: 30 mmol/L (ref 22–32)
Calcium: 9.1 mg/dL (ref 8.9–10.3)
Chloride: 98 mmol/L (ref 98–111)
Creatinine, Ser: 1.06 mg/dL (ref 0.61–1.24)
GFR, Estimated: >60 mL/min (ref >60)
Glucose, Bld: 85 mg/dL (ref 70–99)
Potassium: 3.6 mmol/L (ref 3.5–5.1)
Sodium: 135 mmol/L (ref 135–145)
Total Bilirubin: 0.3 mg/dL (ref 0.3–1.2)
Total Protein: 5.2 g/dL — ABNORMAL LOW (ref 6.5–8.1)

## 2021-07-27 SURGERY — VIDEO BRONCHOSCOPY WITHOUT FLUORO
Anesthesia: General

## 2021-07-27 SURGERY — INSERTION, ENDOVASCULAR STENT GRAFT, AORTA, ABDOMINAL
Anesthesia: General

## 2021-07-27 MED ORDER — FENTANYL CITRATE (PF) 100 MCG/2ML IJ SOLN
INTRAMUSCULAR | Status: DC | PRN
Start: 2021-07-27 — End: 2021-07-27
  Administered 2021-07-27: 25 ug via INTRAVENOUS

## 2021-07-27 MED ORDER — SUGAMMADEX SODIUM 200 MG/2ML IV SOLN
INTRAVENOUS | Status: DC | PRN
Start: 2021-07-27 — End: 2021-07-27
  Administered 2021-07-27: 100 mg via INTRAVENOUS
  Administered 2021-07-27: 200 mg via INTRAVENOUS

## 2021-07-27 MED ORDER — FENTANYL CITRATE (PF) 100 MCG/2ML IJ SOLN
INTRAMUSCULAR | Status: AC
  Filled 2021-07-27: qty 2

## 2021-07-27 MED ORDER — ROCURONIUM BROMIDE 10 MG/ML (PF) SYRINGE
PREFILLED_SYRINGE | INTRAVENOUS | Status: DC | PRN
Start: 2021-07-27 — End: 2021-07-27
  Administered 2021-07-27: 70 mg via INTRAVENOUS

## 2021-07-27 MED ORDER — EPHEDRINE SULFATE-NACL 50-0.9 MG/10ML-% IV SOSY
PREFILLED_SYRINGE | INTRAVENOUS | Status: DC | PRN
  Administered 2021-07-27: 10 mg via INTRAVENOUS

## 2021-07-27 MED ORDER — PHENYLEPHRINE 40 MCG/ML (10ML) SYRINGE FOR IV PUSH (FOR BLOOD PRESSURE SUPPORT)
PREFILLED_SYRINGE | INTRAVENOUS | Status: DC | PRN
Start: 2021-07-27 — End: 2021-07-27
  Administered 2021-07-27 (×4): 80 ug via INTRAVENOUS

## 2021-07-27 MED ORDER — VASOPRESSIN 20 UNIT/ML IV SOLN
INTRAVENOUS | Status: DC | PRN
  Administered 2021-07-27: 1 [IU] via INTRAVENOUS

## 2021-07-27 MED ORDER — LIDOCAINE 2% (20 MG/ML) 5 ML SYRINGE
INTRAMUSCULAR | Status: DC | PRN
  Administered 2021-07-27: 30 mg via INTRAVENOUS

## 2021-07-27 MED ORDER — ONDANSETRON HCL 4 MG/2ML IJ SOLN
INTRAMUSCULAR | Status: DC | PRN
  Administered 2021-07-27: 4 mg via INTRAVENOUS

## 2021-07-27 MED ORDER — ACETAMINOPHEN 500 MG PO TABS
1000.0000 mg | ORAL_TABLET | Freq: Once | ORAL | Status: AC
  Administered 2021-07-27: 1000 mg via ORAL
  Filled 2021-07-27: qty 2

## 2021-07-27 MED ORDER — CHLORHEXIDINE GLUCONATE 0.12 % MT SOLN
15.0000 mL | Freq: Once | OROMUCOSAL | Status: AC
  Administered 2021-07-27: 15 mL via OROMUCOSAL

## 2021-07-27 MED ORDER — CALCIUM CHLORIDE 10 % IV SOLN
INTRAVENOUS | Status: DC | PRN
  Administered 2021-07-27 (×2): 100 mg via INTRAVENOUS

## 2021-07-27 MED ORDER — PROPOFOL 10 MG/ML IV BOLUS
INTRAVENOUS | Status: DC | PRN
  Administered 2021-07-27 (×2): 20 mg via INTRAVENOUS
  Administered 2021-07-27: 30 mg via INTRAVENOUS
  Administered 2021-07-27: 50 mg via INTRAVENOUS

## 2021-07-27 MED ORDER — LACTATED RINGERS IV SOLN: INTRAVENOUS | Status: DC

## 2021-07-27 MED ORDER — PROPOFOL 500 MG/50ML IV EMUL
INTRAVENOUS | Status: DC | PRN
  Administered 2021-07-27: 100 ug/kg/min via INTRAVENOUS

## 2021-07-27 NOTE — Progress Notes (Signed)
Patient will have lung mass biopsy this morning. Patient  still currently NPO

## 2021-07-27 NOTE — Anesthesia Procedure Notes (Signed)
Procedure Name: Intubation Date/Time: 07/27/2021 2:02 PM Performed by: Janene Harvey, CRNA Pre-anesthesia Checklist: Patient identified, Emergency Drugs available, Suction available and Patient being monitored Patient Re-evaluated:Patient Re-evaluated prior to induction Oxygen Delivery Method: Circle system utilized Preoxygenation: Pre-oxygenation with 100% oxygen Induction Type: IV induction Ventilation: Mask ventilation without difficulty Laryngoscope Size: Miller and 2 Grade View: Grade I Tube type: Oral Tube size: 8.5 mm Number of attempts: 1 Airway Equipment and Method: Stylet and Oral airway Placement Confirmation: ETT inserted through vocal cords under direct vision, positive ETCO2 and breath sounds checked- equal and bilateral Secured at: 23 cm Tube secured with: Tape Dental Injury: Teeth and Oropharynx as per pre-operative assessment  Comments: Intubation by [facility]

## 2021-07-27 NOTE — Progress Notes (Signed)
Occupational Therapy Treatment Patient Details Name: [name] MRN: [id number] DOB: Jun 18, 1945 Today's Date: 07/27/2021   History of present illness Pt is a 76 y/o male direct admit from Internal Medicine due to malnutrition and palpitable abdominal mass. CT pending. PMH includes HTN, AAA, A-fib, CAD, falls, pancreatic mass, GERD, and lung CA.   OT comments  "I am hungry and I don't want to do this today".  Patient completed supine to sitting EOB with min guard and patient completed UB grooming task with Supervision s/p setup.  Patient completed sit to stand from EOB with  min A and use of RW,  Patient completed side steps to [facility] with min A using RW. Patient requires max verbal cues for encouragement to participate in therapy session.  Patient educated on importance of getting OOB to decreased overall weakness.   Recommendations for follow up therapy are one component of a multi-disciplinary discharge planning process, led by the attending physician.  Recommendations may be updated based on patient status, additional functional criteria and insurance authorization.    Follow Up Recommendations  Skilled nursing-short term rehab (<3 hours/day)    Assistance Recommended at Discharge Frequent or constant Supervision/Assistance  Patient can return home with the following  A lot of help with bathing/dressing/bathroom   Equipment Recommendations       Recommendations for Other Services      Precautions / Restrictions Precautions Precautions: Fall (fall risk) Precaution Comments: othrostatic in prior tx sessions Restrictions Weight Bearing Restrictions: No       Mobility Bed Mobility Overal bed mobility: Needs Assistance       Supine to sit: Min guard Sit to supine: Min guard        Transfers Overall transfer level: Needs assistance Equipment used: Rolling walker (2 wheels) Transfers: Sit to/from Stand Sit to Stand: Min assist                 Balance Overall  balance assessment: No apparent balance deficits (not formally assessed) Sitting-balance support: Bilateral upper extremity supported       Standing balance support: Bilateral upper extremity supported                               ADL either performed or assessed with clinical judgement   ADL       Grooming: Wash/dry face                                      Extremity/Trunk Assessment     Lower Extremity Assessment Lower Extremity Assessment: Defer to PT evaluation        Vision       Perception     Praxis      Cognition Arousal/Alertness: Awake/alert Behavior During Therapy: WFL for tasks assessed/performed Overall Cognitive Status: Within Functional Limits for tasks assessed                                          Exercises      Shoulder Instructions       General Comments      Pertinent Vitals/ Pain       Pain Assessment Pain Assessment: No/denies pain Pain Score: 0-No pain Faces Pain Scale: No hurt  Home Living  Prior Functioning/Environment              Frequency           Progress Toward Goals  OT Goals(current goals can now be found in the care plan section)  Progress towards OT goals: Progressing toward goals     Plan      Co-evaluation                 AM-PAC OT "6 Clicks" Daily Activity     Outcome Measure   Help from another person eating meals?: A Little Help from another person taking care of personal grooming?: A Lot Help from another person toileting, which includes using toliet, bedpan, or urinal?: A Lot Help from another person bathing (including washing, rinsing, drying)?: A Lot Help from another person to put on and taking off regular upper body clothing?: A Little Help from another person to put on and taking off regular lower body clothing?: A Lot 6 Click Score: 14    End of Session Equipment  Utilized During Treatment: Rolling walker (2 wheels)      Activity Tolerance Patient limited by fatigue   Patient Left with bed alarm set   Nurse Communication          Time: 4503-8882 OT Time Calculation (min): 12 min  Charges: OT General Charges $OT Visit: 1 Visit OT Treatments $Self Care/Home Management : 8-22 mins    Hadley Pen OTR/L 07/27/2021, 1:41 PM

## 2021-07-27 NOTE — Consult Note (Signed)
NAME:  [name], MRN:  [id number], DOB:  07-21-1945, LOS: 4 ADMISSION DATE:  07/23/2021, CONSULTATION DATE:  2/9 REFERRING MD:  Dr. 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  Patient states he has had unintentional weight loss over the past year.  Has lost about 37 pounds.  States he has a lack of appetite.  Denies night sweats, N/V/D/C, fever, chills, SOB.  On 2/6 patient presents to [facility] ED for worsening generalized weakness.  MRI shows pancreatic lesion.  GI consulted.  May require EUS for ampullary lesion.  CA 19-9 mildly elevated at 41.  CEA 4.8.  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  History of adenomatous colon polyps (last colonoscopy May 2025).  CT chest performed 2/8 showing 1.4 cm pulmonary nodule proximal R main bronchus; slightly enlarged lymph nodes in R hilum.   PCCM consulted for possible bronchoscopy with biopsy.  Past Medical History:  Diagnosis Date   AAA (abdominal aortic aneurysm)    followed by vasc surgery   Alcohol abuse    Barretts esophagus    bx distal esophagus 2011 neg    Bladder outlet obstruction 04/02/2018   CAD in native artery    a. s/p stent to prox RCA and mid Cx.   Cataract    Cholelithiasis    Chronic osteomyelitis involving ankle and foot, right ([place]) 07/13/2018   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   2006 - 3 adenomas largest 12 mm  2008 - no polyps 2013-  2 mm cecal polyp, adenoma, routine repeat colonoscopy 02/2017 approx 05/14/2017 18 polyps max 12 mm ALL adenomas recall 2019 [name], MD, [facility]      Dieulafoy lesion of duodenum    Diverticulosis of colon    DJD (degenerative joint disease)    left hip   Full  dentures    upper and lower   GERD (gastroesophageal reflux disease)    diet controlled    Gout    feet   Hiatal hernia    2 cm noted on upper endos 2011    Hx of adenomatous colonic polyps    Hyperlipidemia    Hypertension    Internal hemorrhoid    Lung cancer ([place])    squamous cell (T2N0MX), 11/07- surgically treated, no chemo/XRT   Myocardial infarction ([place]) 2004   hx of, 2 stents 2004. pt was on Plavix x 3 years then transitioned to ASA, no problems since   Non-small cell carcinoma of right lung, stage 1 ([place]) 01/30/2016   Renal failure, acute (HCC)    hx of 2/2 medication, resolved   Tobacco abuse    smoker .5 ppd x 55 yrs   UTI (lower urinary tract infection)      Significant Hospital Events: Including procedures, antibiotic start and stop dates in addition to other pertinent events   2/6 admitted w/ severe malnutrition  Interim History / Subjective:   Pt is very weak Denies any pain, SOB  Objective   Blood pressure (!) 109/55, pulse 85, temperature 98.6 F (37 C), temperature source Oral, resp. rate 18, height 6' (1.829 m), weight 51.8 kg, SpO2 100 %.  Intake/Output Summary (Last 24 hours) at 07/27/2021 9201 Last data filed at 07/26/2021 1826 Gross per 24 hour  Intake 360 ml  Output --  Net 360 ml   Filed Weights   07/24/21 0538 07/25/21 0829  Weight: 51.8 kg 51.8 kg    Examination: General:  weak thin man, NAD HEENT: Op clear, pupils equal Neuro: Awake, a bit slow to respond, oriented to self and situation, globally weak, moves all ext CV: distant, regular PULM:  distant, decreased on r, no wheeze GI: non-distended, soft, + BS Extremities: no edema Skin: no rash  CT chest 2/8: 1.4 cm pulmonary nodule proximal R main bronchus; slightly enlarged lymph nodes in R hilum  Resolved Hospital Problem list     Assessment & Plan:  1.4 cm pulmonary nodule proximal R main bronchus: CT chest 2/8 History of stage 1 squamouns cell lung cancer of the  RLL: surgically removed 11/07; no chemo/XRT Hx of tobacco abuse P: -I have reviewed serial imaging, multiple CT chest back to 2017. The proximay R mainstem lesion may reflect chronic scar, changes post-resection. It is more prominent, enlarging. Could reflect malignancy. Spoke to him today regarding inspection FOB. He was initially leaning against performing, but on further discussion he understands that this could reflect cancer. He tells me that he would want a dx and would want treatment if it were possible. Based on this, we discussed procedure further and he agrees to proceed. FOB planned for this afternoon.    Unintentional weight loss, severe malnutrition, declining functional status Pancreatic mass Prostate cancer with BOO Infrarenal AAA (6.1cm) Prostate cancer s/p TURP, history of BOO Pancreatic insufficiency CAD, history of MI  Chronic thrombocytopenia Chronic normocytic anemia P: -per primary  Best Practice (right click and "Reselect all SmartList Selections" daily)   Per primary  Labs   CBC: Recent Labs  Lab 07/23/21 1130 07/24/21 0343 07/25/21 0318 07/26/21 0131 07/27/21 0139  WBC 3.6* 3.2* 3.3* 5.7 2.7*  NEUTROABS 2.4  --   --   --   --   HGB 12.4* 10.3* 10.9* 10.6* 9.3*  HCT 40.8 33.4* 36.0* 33.0* 29.4*  MCV 88.9 87.7 88.9 86.8 88.0  PLT 108* 95* 98* 85* 78*    Basic Metabolic Panel: Recent Labs  Lab 07/23/21 1130 07/24/21 0343 07/25/21 0318 07/26/21 0131 07/27/21 0139  NA 138 139 138 134* 135  K 4.6 3.8 3.6 3.7 3.6  CL 100 102 101 100 98  CO2 28 28 27 27 30   GLUCOSE 96 79 79 105* 85  BUN 22 18 17 13 21   CREATININE 1.16 1.03 0.94 0.85 1.06  CALCIUM 9.8 9.5 9.6 9.1 9.1  MG 2.3  --   --   --   --   PHOS 2.5  --   --   --   --    GFR: Estimated Creatinine Clearance: 44.1 mL/min (by C-G formula based on SCr of 1.06 mg/dL). Recent Labs  Lab 07/24/21 0343 07/25/21 0318 07/26/21 0131 07/27/21 0139  WBC 3.2* 3.3* 5.7 2.7*    Liver Function  Tests: Recent Labs  Lab 07/23/21 1130 07/25/21 0318 07/26/21 0131 07/27/21 0139  AST 29 22 21  32  ALT 13 13 10 20   ALKPHOS 49 48 39 54  BILITOT 1.6* 0.5 0.7 0.3  PROT 6.9 6.5 5.7* 5.2*  ALBUMIN 3.1* 2.8* 2.4* 2.3*   No results for input(s): LIPASE, AMYLASE in the last 168 hours. No results for input(s): AMMONIA in the last 168 hours.  ABG  Component Value Date/Time   TCO2 25 03/06/2021 1226     Coagulation Profile: Recent Labs  Lab 07/23/21 1130  INR 1.1    Cardiac Enzymes: No results for input(s): CKTOTAL, CKMB, CKMBINDEX, TROPONINI in the last 168 hours.  HbA1C: Hgb A1c MFr Bld  Date/Time Value Ref Range Status  04/27/2021 08:48 AM 5.5 4.8 - 5.6 % Final    Comment:    (NOTE) Pre diabetes:          5.7%-6.4%  Diabetes:              >6.4%  Glycemic control for   <7.0% adults with diabetes   11/07/2020 06:18 AM 5.4 4.8 - 5.6 % Final    Comment:    (NOTE)         Prediabetes: 5.7 - 6.4         Diabetes: >6.4         Glycemic control for adults with diabetes: <7.0     CBG: Recent Labs  Lab 07/24/21 1116 07/25/21 0740  GLUCAP 96 105*         Baltazar Apo, MD, PhD 07/27/2021, 9:23 AM [facility] [phone] or if no answer before 7:00PM call [phone] For any issues after 7:00PM please call eLink [phone]

## 2021-07-27 NOTE — H&P (View-Only) (Signed)
NAME:  [name], MRN:  [id number], DOB:  04-11-1946, LOS: 4 ADMISSION DATE:  07/23/2021, CONSULTATION DATE:  2/9 REFERRING MD:  Dr. 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  Patient states he has had unintentional weight loss over the past year.  Has lost about 37 pounds.  States he has a lack of appetite.  Denies night sweats, N/V/D/C, fever, chills, SOB.  On 2/6 patient presents to [facility] ED for worsening generalized weakness.  MRI shows pancreatic lesion.  GI consulted.  May require EUS for ampullary lesion.  CA 19-9 mildly elevated at 41.  CEA 4.8.  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  History of adenomatous colon polyps (last colonoscopy May 2025).  CT chest performed 2/8 showing 1.4 cm pulmonary nodule proximal R main bronchus; slightly enlarged lymph nodes in R hilum.   PCCM consulted for possible bronchoscopy with biopsy.  Past Medical History:  Diagnosis Date   AAA (abdominal aortic aneurysm)    followed by vasc surgery   Alcohol abuse    Barretts esophagus    bx distal esophagus 2011 neg    Bladder outlet obstruction 04/02/2018   CAD in native artery    a. s/p stent to prox RCA and mid Cx.   Cataract    Cholelithiasis    Chronic osteomyelitis involving ankle and foot, right ([place]) 07/13/2018   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   2006 - 3 adenomas largest 12 mm  2008 - no polyps 2013-  2 mm cecal polyp, adenoma, routine repeat colonoscopy 02/2017 approx 05/14/2017 18 polyps max 12 mm ALL adenomas recall 2019 [name], MD, [facility]      Dieulafoy lesion of duodenum    Diverticulosis of colon    DJD (degenerative joint disease)    left hip   Full  dentures    upper and lower   GERD (gastroesophageal reflux disease)    diet controlled    Gout    feet   Hiatal hernia    2 cm noted on upper endos 2011    Hx of adenomatous colonic polyps    Hyperlipidemia    Hypertension    Internal hemorrhoid    Lung cancer ([place])    squamous cell (T2N0MX), 11/07- surgically treated, no chemo/XRT   Myocardial infarction ([place]) 2004   hx of, 2 stents 2004. pt was on Plavix x 3 years then transitioned to ASA, no problems since   Non-small cell carcinoma of right lung, stage 1 ([place]) 01/30/2016   Renal failure, acute (HCC)    hx of 2/2 medication, resolved   Tobacco abuse    smoker .5 ppd x 55 yrs   UTI (lower urinary tract infection)      Significant Hospital Events: Including procedures, antibiotic start and stop dates in addition to other pertinent events   2/6 admitted w/ severe malnutrition  Interim History / Subjective:   Pt is very weak Denies any pain, SOB  Objective   Blood pressure (!) 109/55, pulse 85, temperature 98.6 F (37 C), temperature source Oral, resp. rate 18, height 6' (1.829 m), weight 51.8 kg, SpO2 100 %.  Intake/Output Summary (Last 24 hours) at 07/27/2021 2094 Last data filed at 07/26/2021 1826 Gross per 24 hour  Intake 360 ml  Output --  Net 360 ml   Filed Weights   07/24/21 0538 07/25/21 0829  Weight: 51.8 kg 51.8 kg    Examination: General:  weak thin man, NAD HEENT: Op clear, pupils equal Neuro: Awake, a bit slow to respond, oriented to self and situation, globally weak, moves all ext CV: distant, regular PULM:  distant, decreased on r, no wheeze GI: non-distended, soft, + BS Extremities: no edema Skin: no rash  CT chest 2/8: 1.4 cm pulmonary nodule proximal R main bronchus; slightly enlarged lymph nodes in R hilum  Resolved Hospital Problem list     Assessment & Plan:  1.4 cm pulmonary nodule proximal R main bronchus: CT chest 2/8 History of stage 1 squamouns cell lung cancer of the  RLL: surgically removed 11/07; no chemo/XRT Hx of tobacco abuse P: -I have reviewed serial imaging, multiple CT chest back to 2017. The proximay R mainstem lesion may reflect chronic scar, changes post-resection. It is more prominent, enlarging. Could reflect malignancy. Spoke to him today regarding inspection FOB. He was initially leaning against performing, but on further discussion he understands that this could reflect cancer. He tells me that he would want a dx and would want treatment if it were possible. Based on this, we discussed procedure further and he agrees to proceed. FOB planned for this afternoon.    Unintentional weight loss, severe malnutrition, declining functional status Pancreatic mass Prostate cancer with BOO Infrarenal AAA (6.1cm) Prostate cancer s/p TURP, history of BOO Pancreatic insufficiency CAD, history of MI  Chronic thrombocytopenia Chronic normocytic anemia P: -per primary  Best Practice (right click and "Reselect all SmartList Selections" daily)   Per primary  Labs   CBC: Recent Labs  Lab 07/23/21 1130 07/24/21 0343 07/25/21 0318 07/26/21 0131 07/27/21 0139  WBC 3.6* 3.2* 3.3* 5.7 2.7*  NEUTROABS 2.4  --   --   --   --   HGB 12.4* 10.3* 10.9* 10.6* 9.3*  HCT 40.8 33.4* 36.0* 33.0* 29.4*  MCV 88.9 87.7 88.9 86.8 88.0  PLT 108* 95* 98* 85* 78*    Basic Metabolic Panel: Recent Labs  Lab 07/23/21 1130 07/24/21 0343 07/25/21 0318 07/26/21 0131 07/27/21 0139  NA 138 139 138 134* 135  K 4.6 3.8 3.6 3.7 3.6  CL 100 102 101 100 98  CO2 28 28 27 27 30   GLUCOSE 96 79 79 105* 85  BUN 22 18 17 13 21   CREATININE 1.16 1.03 0.94 0.85 1.06  CALCIUM 9.8 9.5 9.6 9.1 9.1  MG 2.3  --   --   --   --   PHOS 2.5  --   --   --   --    GFR: Estimated Creatinine Clearance: 44.1 mL/min (by C-G formula based on SCr of 1.06 mg/dL). Recent Labs  Lab 07/24/21 0343 07/25/21 0318 07/26/21 0131 07/27/21 0139  WBC 3.2* 3.3* 5.7 2.7*    Liver Function  Tests: Recent Labs  Lab 07/23/21 1130 07/25/21 0318 07/26/21 0131 07/27/21 0139  AST 29 22 21  32  ALT 13 13 10 20   ALKPHOS 49 48 39 54  BILITOT 1.6* 0.5 0.7 0.3  PROT 6.9 6.5 5.7* 5.2*  ALBUMIN 3.1* 2.8* 2.4* 2.3*   No results for input(s): LIPASE, AMYLASE in the last 168 hours. No results for input(s): AMMONIA in the last 168 hours.  ABG  Component Value Date/Time   TCO2 25 03/06/2021 1226     Coagulation Profile: Recent Labs  Lab 07/23/21 1130  INR 1.1    Cardiac Enzymes: No results for input(s): CKTOTAL, CKMB, CKMBINDEX, TROPONINI in the last 168 hours.  HbA1C: Hgb A1c MFr Bld  Date/Time Value Ref Range Status  04/27/2021 08:48 AM 5.5 4.8 - 5.6 % Final    Comment:    (NOTE) Pre diabetes:          5.7%-6.4%  Diabetes:              >6.4%  Glycemic control for   <7.0% adults with diabetes   11/07/2020 06:18 AM 5.4 4.8 - 5.6 % Final    Comment:    (NOTE)         Prediabetes: 5.7 - 6.4         Diabetes: >6.4         Glycemic control for adults with diabetes: <7.0     CBG: Recent Labs  Lab 07/24/21 1116 07/25/21 0740  GLUCAP 96 105*         Baltazar Apo, MD, PhD 07/27/2021, 9:23 AM [facility] [phone] or if no answer before 7:00PM call [phone] For any issues after 7:00PM please call eLink [phone]

## 2021-07-27 NOTE — Interval H&P Note (Signed)
History and Physical Interval Note:  07/27/2021 1:30 PM  [name]  has presented today for surgery, with the diagnosis of lung nodule.  The various methods of treatment have been discussed with the patient and family. After consideration of risks, benefits and other options for treatment, the patient has consented to  Procedure(s): [place] (N/A) as a surgical intervention.  The patient's history has been reviewed, patient examined, no change in status, stable for surgery.  I have reviewed the patient's chart and labs.  Questions were answered to the patient's satisfaction.     Collene Gobble

## 2021-07-27 NOTE — Progress Notes (Incomplete)
HD#4 SUBJECTIVE:  Patient Summary: [name] is a 76 y.o. with pertinent PMH of pancreatic mass, Barrett's esophagus, prostate CA with BOO, lung Ca, HTN, HLD,  who presented with worsening weight loss and deconditioning and admit for dehydration and malignancy workup on hospital day 1  Overnight Events: no acute events overnight    Interm History: Patient seen and evaluated at bedsdie. Says he feels okay this moring. He tolerated EGD well yesterday. Asking about CT scan results. Discussed findings with im regarding new nodule. Patient non-committal about have EVAR done. Told him we will discuss with vascular and pulm about future workup.  OBJECTIVE:  Vital Signs: Vitals:   07/26/21 0759 07/26/21 1711 07/26/21 2024 07/27/21 0425  BP: 100/64 131/68 115/82 (!) 99/57  Pulse: 69 73 (!) 106 (!) 59  Resp: 18 18 18 17   Temp: 98.6 F (37 C) 98.1 F (36.7 C) 99.3 F (37.4 C) 98 F (36.7 C)  TempSrc: Oral Oral Oral   SpO2: 100% 99% 99% 100%  Weight:      Height:       Supplemental O2: Room Air SpO2: 100 % O2 Flow Rate (L/min): 2 L/min  Filed Weights   07/24/21 0538 07/25/21 0829  Weight: 51.8 kg 51.8 kg     Intake/Output Summary (Last 24 hours) at 07/27/2021 0707 Last data filed at 07/26/2021 1826 Gross per 24 hour  Intake 360 ml  Output --  Net 360 ml    Net IO Since Admission: 2,044.92 mL [07/27/21 0707]  Physical Exam: General: chronically ill, catechetic appearing Eyes: no scleral icterus or conjunctival injection.  Cardiac: regular rate and rhythm, no lower extremity edema Pulm: breathing comfortably on room air, lung sounds are clear GI: abdomen soft, non-tender, non-distended. Hypoactive bowel sounds.  MSK: significant muscle atrophy throughout, generalized weakness Neuro: nonfocal Skin: no rash or lesion on limited exam  Patient Lines/Drains/Airways Status     Active Line/Drains/Airways     Name Placement date Placement time Site Days   Peripheral IV  07/23/21 22 G 1.75" Anterior;Right Forearm 07/23/21  1206  Forearm  1            Pertinent Labs: CBC Latest Ref Rng & Units 07/27/2021 07/26/2021 07/25/2021  WBC 4.0 - 10.5 K/uL 2.7(L) 5.7 3.3(L)  Hemoglobin 13.0 - 17.0 g/dL 9.3(L) 10.6(L) 10.9(L)  Hematocrit 39.0 - 52.0 % 29.4(L) 33.0(L) 36.0(L)  Platelets 150 - 400 K/uL 78(L) 85(L) 98(L)    CMP Latest Ref Rng & Units 07/27/2021 07/26/2021 07/25/2021  Glucose 70 - 99 mg/dL 85 105(H) 79  BUN 8 - 23 mg/dL 21 13 17   Creatinine 0.61 - 1.24 mg/dL 1.06 0.85 0.94  Sodium 135 - 145 mmol/L 135 134(L) 138  Potassium 3.5 - 5.1 mmol/L 3.6 3.7 3.6  Chloride 98 - 111 mmol/L 98 100 101  CO2 22 - 32 mmol/L 30 27 27   Calcium 8.9 - 10.3 mg/dL 9.1 9.1 9.6  Total Protein 6.5 - 8.1 g/dL 5.2(L) 5.7(L) 6.5  Total Bilirubin 0.3 - 1.2 mg/dL 0.3 0.7 0.5  Alkaline Phos 38 - 126 U/L 54 39 48  AST 15 - 41 U/L 32 21 22  ALT 0 - 44 U/L 20 10 13     Recent Labs    07/24/21 1116 07/25/21 0740  GLUCAP 96 105*      Pertinent Imaging: No results found.  ASSESSMENT/PLAN:  Assessment: Principal Problem:   Severe malnutrition ([place]) Active Problems:   Neoplasm of ampulla   [name] is a 76 y.o. with pertinent PMH of pancreatic mass, Barrett's esophagus, prostate CA with BOO, lung Ca, HTN, HLD,  who presented with worsening weight loss and deconditioning and admit for dehydration and malignancy workup on hospital day 4  Unintentional weight loss, severe malnutrition, declining functional status Suspected underlying malignancy given 40lb weight loss over the past year with deconditioning and decline in functional status. . CEA and CA19-9, and LDH all elevated. Patient had MRCP performed  limited by movement artefact, however did show some hyperintensity in pancreatitic head on imaging but these were non-diagnostic.  - GI performed EGD which showed bulging ampulla with abnormal mucosa suspicious for adenoma. Recommending outpatient EUS for further  investigation. He has been started on mirtazapine to stimulate appetite.   - CT scan of abd was performed yesterday and showed 1.4 cm lesion suspicious for either scar or cancer with recommended PET CT or bronch with bx. Discussed with pulm who is planning to evaluate the patient.  - Palliative was asked to evaluate the patient given poor overall health and multiple comorbidities including several high risk findings requiring procedures.  Patient has elected to continue management and proceed with treatments as he is able. MOST form has been completed.  - He will likely need SNF on discharge given significant muscle wasting.    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 he is scheduled to undergo EVAR on 2/10.  - NPO at midnight pending vascular decision to proceed with procedure.   Prostate cancer s/p TURP, history of BOO - patient has been retaining urine. Resistance noted when I+O performed. Coude cath  placed.    Pancreatic insufficiency.  - Continue enzyme replacement.    CAD, history of MI (2004).  - Continue statin and aspirin.   Chronic thrombocytopenia.  - Stable. Continue to monitor.    Chronic normocytic anemia.  - Stable. Continue to monitor  Best Practice: Diet: NPO at midnight DISPO: Anticipated discharge pending Medical stability.  Signature: Delene Ruffini, MD  Internal Medicine Resident, PGY-1 [facility]  Pager: [phone] 7:07 AM, 07/27/2021   Please contact the on call pager after 5 pm and on weekends at [phone].

## 2021-07-27 NOTE — Progress Notes (Signed)
Report called to lindsey\,RN in short stay

## 2021-07-27 NOTE — Progress Notes (Signed)
MD came by this morning and stated that patient procedure is called for today

## 2021-07-27 NOTE — Progress Notes (Signed)
This chaplain attempted spiritual care consult requested by PMT. The chaplain understands a medical procedure is planned for this morning.   The chaplain knocked and quietly entered the dark room. The chaplain chose not to call his name, the Pt. appeared to be sleeping. The chaplain updated the Pt. RN-Jasmin and will plan on a F/U visit.  Chaplain Sallyanne Kuster [phone]

## 2021-07-27 NOTE — Progress Notes (Signed)
VASCULAR SURGERY:  This patient was apparently admitted 3 days ago with failure to thrive.  I came up to examine him this morning and he has rapidly deteriorated since I saw him last.  His surgery had been delayed because he had an indwelling Foley catheter which would put him at risk for graft infection.  Given his markedly debilitated state we will cancel his surgery today.  We can reevaluate in the future if he makes a strong recovery.  However at this point he would be at too high risk for surgery and even though this is an endovascular repair he has multiple complicating issues which would make it riskier regardless.  I will check back on Monday.  If any problems over the weekend Dr. Stanford Breed is on call.  Gae Gallop, MD 8:35 AM

## 2021-07-27 NOTE — Progress Notes (Signed)
HD#4 SUBJECTIVE:  Patient Summary: [name] is a 76 y.o. with pertinent PMH of pancreatic mass, Barrett's esophagus, prostate CA with BOO, lung Ca, HTN, HLD,  who presented with worsening weight loss and deconditioning and admit for dehydration and malignancy workup on hospital day 1  Overnight Events: no acute events overnight    Interm History: Patient seen and evaluated at bedsdie. No complaints. Planned for bronch later today..  OBJECTIVE:  Vital Signs: Vitals:   07/27/21 0425 07/27/21 0759 07/27/21 0803 07/27/21 1257  BP: (!) 99/57 (!) 109/55  117/71  Pulse: (!) 59 76 85 78  Resp: 17 18    Temp: 98 F (36.7 C) 98.6 F (37 C)  (!) 97.5 F (36.4 C)  TempSrc:  Oral  Oral  SpO2: 100% (!) 86% 100% 100%  Weight:      Height:       Supplemental O2: Room Air SpO2: 100 % O2 Flow Rate (L/min): 2 L/min  Filed Weights   07/24/21 0538 07/25/21 0829  Weight: 51.8 kg 51.8 kg     Intake/Output Summary (Last 24 hours) at 07/27/2021 1350 Last data filed at 07/26/2021 1826 Gross per 24 hour  Intake 120 ml  Output --  Net 120 ml    Net IO Since Admission: 2,044.92 mL [07/27/21 1350]  Physical Exam: General: chronically ill, catechetic appearing Eyes: no scleral icterus or conjunctival injection.  Cardiac: regular rate and rhythm, no lower extremity edema Pulm: breathing comfortably on room air, lung sounds are clear GI: abdomen soft, non-tender, non-distended. Hypoactive bowel sounds.  MSK: significant muscle atrophy throughout, generalized weakness Neuro: nonfocal Skin: no rash or lesion on limited exam  Patient Lines/Drains/Airways Status     Active Line/Drains/Airways     Name Placement date Placement time Site Days   Peripheral IV 07/23/21 22 G 1.75" Anterior;Right Forearm 07/23/21  1206  Forearm  1            Pertinent Labs: CBC Latest Ref Rng & Units 07/27/2021 07/26/2021 07/25/2021  WBC 4.0 - 10.5 K/uL 2.7(L) 5.7 3.3(L)  Hemoglobin 13.0 - 17.0 g/dL  9.3(L) 10.6(L) 10.9(L)  Hematocrit 39.0 - 52.0 % 29.4(L) 33.0(L) 36.0(L)  Platelets 150 - 400 K/uL 78(L) 85(L) 98(L)    CMP Latest Ref Rng & Units 07/27/2021 07/26/2021 07/25/2021  Glucose 70 - 99 mg/dL 85 105(H) 79  BUN 8 - 23 mg/dL '21 13 17  ' Creatinine 0.61 - 1.24 mg/dL 1.06 0.85 0.94  Sodium 135 - 145 mmol/L 135 134(L) 138  Potassium 3.5 - 5.1 mmol/L 3.6 3.7 3.6  Chloride 98 - 111 mmol/L 98 100 101  CO2 22 - 32 mmol/L '30 27 27  ' Calcium 8.9 - 10.3 mg/dL 9.1 9.1 9.6  Total Protein 6.5 - 8.1 g/dL 5.2(L) 5.7(L) 6.5  Total Bilirubin 0.3 - 1.2 mg/dL 0.3 0.7 0.5  Alkaline Phos 38 - 126 U/L 54 39 48  AST 15 - 41 U/L 32 21 22  ALT 0 - 44 U/L '20 10 13    ' Recent Labs    07/25/21 0740  GLUCAP 105*      Pertinent Imaging: No results found.  ASSESSMENT/PLAN:  Assessment: Principal Problem:   Abnormal CT of the chest   [name] is a 76 y.o. with pertinent PMH of pancreatic mass, Barrett's esophagus, prostate CA with BOO, lung Ca, HTN, HLD,  who presented with worsening weight loss and deconditioning and admit for dehydration and malignancy workup on hospital day 4  Unintentional  weight loss, severe malnutrition, declining functional status - continue mirtazapine for appetite. EUS outpatient. - CT showing lung nodule. Pulm planning for bronch with bx later today for further evaluation. Will follow up results - palliative care met with patient. He is still wanting to proceed with interventions.  - He will likely need SNF on discharge given significant muscle wasting.    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 he is scheduled to undergo EVAR on 2/10.  - EVAR canceled, can be reassessed by vascular on Monday if patient still in hospital at that point.   Prostate cancer s/p TURP, history of BOO - patient has been retaining urine. Resistance noted when I+O performed. Coude cath  placed.    Pancytopenia with normocytic anemia -  concern for possible MDS. No evidence of bleeding at this time. Hgb  stable.  No signs of infection.  - can likely be worked up for [place] as outpatient.   Signature: Delene Ruffini, MD  Internal Medicine Resident, PGY-1 [facility]  Pager: [phone] 1:50 PM, 07/27/2021   Please contact the on call pager after 5 pm and on weekends at [phone].

## 2021-07-27 NOTE — Op Note (Signed)
[facility] Cardiopulmonary Patient Name: [name] Date: 07/27/2021 MRN: [id number] Attending MD: Collene Gobble , MD Date of Birth: 1946/03/12 CSN: Finalized Age: 76 Admit Type: Inpatient Gender: Male Procedure:             Bronchoscopy Indications:           Suspicious right mainstem lesion Providers:             Collene Gobble, MD, Dulcy Fanny, [facility]                         Technician, Technician Referring MD:           Medicines:              Complications:         No immediate complications Estimated Blood Loss:  Estimated blood loss: none. Procedure:             Pre-Anesthesia Assessment:                        - A History and Physical has been performed. Patient                         meds and allergies have been reviewed. The risks and                         benefits of the procedure and the sedation options and                         risks were discussed with the patient. All questions                         were answered and informed consent was obtained.                         Patient identification and proposed procedure were                         verified prior to the procedure by the physician in                         the pre-procedure area. Mental Status Examination:                         alert but confused. Airway Examination: normal                         oropharyngeal airway. Respiratory Examination: poor                         air movement. CV Examination: normal. ASA Grade                         Assessment: III - A patient with severe systemic                         disease. After reviewing the risks and benefits, the  patient was deemed in satisfactory condition to                         undergo the procedure. The anesthesia plan was to use                         general anesthesia. Immediately prior to                         administration of medications, the patient was                          re-assessed for adequacy to receive sedatives. The                         heart rate, respiratory rate, oxygen saturations,                         blood pressure, adequacy of pulmonary ventilation, and                         response to care were monitored throughout the                         procedure. The physical status of the patient was                         re-assessed after the procedure.                        After obtaining informed consent, the bronchoscope was                         passed under direct vision. Throughout the procedure,                         the patient's blood pressure, pulse, and oxygen                         saturations were monitored continuously. the [id number]                         ([id number]) Olympus bronchoscope was introduced through                         the nose, via the endotracheal tube (the patient was                         intubated for the procedure) and advanced to the                         tracheobronchial tree of both lungs. The procedure was                         accomplished without difficulty. The patient tolerated                         the procedure well. Scope In: Scope Out: Findings:      The  trachea is of normal caliber. The carina is sharp. The       tracheobronchial tree of the left lung was examined to at least the       first subsegmental level. Bronchial mucosa and anatomy in the left lung       are normal; there are no endobronchial lesions, and no secretions.      Right Lung Abnormalities: Mild thickening and hyperplasia of the mucosa       was found in the right mainstem bronchus, pertruding medially over the       RUL oriface. The RUL stump was intact with staples intact. The proximal       right mainstem mucosa was slightly hypopigmented, irrgeular, but there       was no overt endobronchial lesion. No clear visibal lesion to suggest       malignancy. Guided brushings were obtained in the  right mainstem       bronchus with a cytology brush and sent for routine cytology. Two       samples were obtained. Endobronchial biopsies were performed in the       right mainstem bronchus using forceps and sent for histopathology       examination. Six samples were obtained. Impression:            - Suspicious right mainstem hyperplasia, overhanging                         the RUL oriface                        - The airway examination of the left lung was normal.                        - Hypopigmentation, slight thickening of mucosa was                         found in the right mainstem bronchus.                        - Brushings were obtained R mainstem.                        - Endobronchial biopsies performed R mainstem. Moderate Sedation:      Performed under general anesthesia Recommendation:        - Await biopsy and brushing results. Procedure Code(s):     --- Professional ---                        [phone], Bronchoscopy, rigid or flexible, including                         fluoroscopic guidance, when performed; with bronchial                         or endobronchial biopsy(s), single or multiple sites                        56314, Bronchoscopy, rigid or flexible, including                         fluoroscopic guidance, when performed; with brushing  or protected brushings Diagnosis Code(s):     --- Professional ---                        R91.8, Other nonspecific abnormal finding of lung field                        J98.09, Other diseases of bronchus, not elsewhere                         classified CPT copyright 2019 [employer] Medical Association. All rights reserved. The codes documented in this report are preliminary and upon coder review may  be revised to meet current compliance requirements. Collene Gobble, MD Collene Gobble, MD 07/27/2021 2:42:19 PM Number of Addenda: 0

## 2021-07-27 NOTE — Transfer of Care (Signed)
Immediate Anesthesia Transfer of Care Note  Patient: [name]  Procedure(s) Performed: VIDEO BRONCHOSCOPY WITHOUT FLUORO BRONCHIAL BRUSHINGS BRONCHIAL BIOPSIES  Patient Location: Endoscopy Unit  Anesthesia Type:General  Level of Consciousness: awake and patient cooperative  Airway & Oxygen Therapy: Patient Spontanous Breathing and Patient connected to face mask oxygen  Post-op Assessment: Report given to RN and Post -op Vital signs reviewed and stable  Post vital signs: Reviewed and stable  Last Vitals:  Vitals Value Taken Time  BP 122/56   Temp    Pulse 73   Resp 19 07/27/21 1451  SpO2 97%   Vitals shown include unvalidated device data.  Last Pain:  Vitals:   07/27/21 1446  TempSrc: (P) Temporal  PainSc:       Patients Stated Pain Goal: 0 (91/22/58 3462)  Complications: No notable events documented.

## 2021-07-28 DIAGNOSIS — D135 Benign neoplasm of extrahepatic bile ducts: Secondary | ICD-10-CM

## 2021-07-28 LAB — CBC
HCT: 30.5 % — ABNORMAL LOW (ref 39.0–52.0)
Hemoglobin: 9.1 g/dL — ABNORMAL LOW (ref 13.0–17.0)
MCH: 26.8 pg (ref 26.0–34.0)
MCHC: 29.8 g/dL — ABNORMAL LOW (ref 30.0–36.0)
MCV: 89.7 fL (ref 80.0–100.0)
Platelets: 72 10*3/uL — ABNORMAL LOW (ref 150–400)
RBC: 3.4 MIL/uL — ABNORMAL LOW (ref 4.22–5.81)
RDW: 17.4 % — ABNORMAL HIGH (ref 11.5–15.5)
WBC: 3 10*3/uL — ABNORMAL LOW (ref 4.0–10.5)
nRBC: 0 % (ref 0.0–0.2)

## 2021-07-28 LAB — COMPREHENSIVE METABOLIC PANEL
ALT: 24 U/L (ref 0–44)
AST: 39 U/L (ref 15–41)
Albumin: 2.3 g/dL — ABNORMAL LOW (ref 3.5–5.0)
Alkaline Phosphatase: 58 U/L (ref 38–126)
Anion gap: 6 (ref 5–15)
BUN: 23 mg/dL (ref 8–23)
CO2: 31 mmol/L (ref 22–32)
Calcium: 9.1 mg/dL (ref 8.9–10.3)
Chloride: 100 mmol/L (ref 98–111)
Creatinine, Ser: 1 mg/dL (ref 0.61–1.24)
GFR, Estimated: >60 mL/min (ref >60)
Glucose, Bld: 89 mg/dL (ref 70–99)
Potassium: 3.2 mmol/L — ABNORMAL LOW (ref 3.5–5.1)
Sodium: 137 mmol/L (ref 135–145)
Total Bilirubin: 0.3 mg/dL (ref 0.3–1.2)
Total Protein: 5.3 g/dL — ABNORMAL LOW (ref 6.5–8.1)

## 2021-07-28 MED ORDER — POTASSIUM CHLORIDE 20 MEQ PO PACK
60.0000 meq | PACK | Freq: Once | ORAL | Status: AC
  Administered 2021-07-28: 60 meq via ORAL
  Filled 2021-07-28: qty 3

## 2021-07-28 NOTE — Progress Notes (Signed)
HD#5 SUBJECTIVE:  Patient Summary: [name] is a 76 y.o. with pertinent PMH of pancreatic mass, Barrett's esophagus, prostate CA with BOO, lung Ca, HTN, HLD,  who presented with worsening weight loss and deconditioning and admit for dehydration and malignancy workup on hospital day 1  Overnight Events: no acute events overnight    Interm History: Patient seen and evaluated at bedsdie. No complaints. Tolerated bronch well. He has noticed some improvement in appetite since starting mirtazapine.   OBJECTIVE:  Vital Signs: Vitals:   07/27/21 2021 07/28/21 0410 07/28/21 0424 07/28/21 0714  BP: 101/63 118/63  130/69  Pulse: 63 100  (!) 56  Resp: '20 20  16  ' Temp: 98.2 F (36.8 C) 98 F (36.7 C)  97.9 F (36.6 C)  TempSrc: Oral   Oral  SpO2: 98% 100%  100%  Weight:   53.2 kg   Height:       Supplemental O2: Room Air SpO2: 100 % O2 Flow Rate (L/min): 4 L/min  Filed Weights   07/24/21 0538 07/25/21 0829 07/28/21 0424  Weight: 51.8 kg 51.8 kg 53.2 kg     Intake/Output Summary (Last 24 hours) at 07/28/2021 1355 Last data filed at 07/27/2021 1905 Gross per 24 hour  Intake 147.17 ml  Output 200 ml  Net -52.83 ml   Net IO Since Admission: 1,492.09 mL [07/28/21 1355]  Physical Exam: General: chronically ill, catechetic appearing Eyes: no scleral icterus or conjunctival injection.  Cardiac: regular rate and rhythm, no lower extremity edema Pulm: breathing comfortably on room air, lung sounds are clear GI: abdomen soft, non-tender, non-distended. Hypoactive bowel sounds.  MSK: significant muscle atrophy throughout, generalized weakness Neuro: nonfocal Skin: no rash or lesion on limited exam  Patient Lines/Drains/Airways Status     Active Line/Drains/Airways     Name Placement date Placement time Site Days   Peripheral IV 07/23/21 22 G 1.75" Anterior;Right Forearm 07/23/21  1206  Forearm  1            Pertinent Labs: CBC Latest Ref Rng & Units 07/28/2021  07/27/2021 07/26/2021  WBC 4.0 - 10.5 K/uL 3.0(L) 2.7(L) 5.7  Hemoglobin 13.0 - 17.0 g/dL 9.1(L) 9.3(L) 10.6(L)  Hematocrit 39.0 - 52.0 % 30.5(L) 29.4(L) 33.0(L)  Platelets 150 - 400 K/uL 72(L) 78(L) 85(L)    CMP Latest Ref Rng & Units 07/28/2021 07/27/2021 07/26/2021  Glucose 70 - 99 mg/dL 89 85 105(H)  BUN 8 - 23 mg/dL '23 21 13  ' Creatinine 0.61 - 1.24 mg/dL 1.00 1.06 0.85  Sodium 135 - 145 mmol/L 137 135 134(L)  Potassium 3.5 - 5.1 mmol/L 3.2(L) 3.6 3.7  Chloride 98 - 111 mmol/L 100 98 100  CO2 22 - 32 mmol/L '31 30 27  ' Calcium 8.9 - 10.3 mg/dL 9.1 9.1 9.1  Total Protein 6.5 - 8.1 g/dL 5.3(L) 5.2(L) 5.7(L)  Total Bilirubin 0.3 - 1.2 mg/dL 0.3 0.3 0.7  Alkaline Phos 38 - 126 U/L 58 54 39  AST 15 - 41 U/L 39 32 21  ALT 0 - 44 U/L '24 20 10    ' No results for input(s): GLUCAP in the last 72 hours.    Pertinent Imaging: No results found.  ASSESSMENT/PLAN:  Assessment: Principal Problem:   Abnormal CT of the chest Active Problems:   Mass of pancreas   [place] [name] is a 76 y.o. with pertinent PMH of pancreatic mass, Barrett's esophagus, prostate CA with BOO, lung Ca, HTN, HLD,  who presented with worsening weight loss  and deconditioning and admit for dehydration and malignancy workup on hospital day 5  Unintentional weight loss, severe malnutrition, declining functional status - continue mirtazapine for appetite. EUS outpatient for adenoma. - FOB done yesterday for suspicious nodule seen on CT scan. Brushings obtained, will plan to follow up cytology. - palliative care met with patient. He is still wanting to proceed with interventions as tolerated.  - He will likely need SNF on discharge given significant muscle wasting.    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 he is scheduled to undergo EVAR on 2/10.  - EVAR canceled, can be reassessed by vascular on Monday if patient still in hospital at that point.   Prostate cancer s/p TURP, history of BOO - patient has been retaining urine. Resistance  noted when I+O performed. Coude cath  placed.    Pancytopenia with normocytic anemia -  concern for possible MDS. No evidence of bleeding at this time. Hgb stable.  No signs of infection.  - can likely be worked up for [place] as outpatient.  - continue to monitor for signs of infection, bleeding, anemia  Signature: Delene Ruffini, MD  Internal Medicine Resident, PGY-1 [facility]  Pager: [phone] 1:55 PM, 07/28/2021   Please contact the on call pager after 5 pm and on weekends at [phone].

## 2021-07-28 NOTE — Progress Notes (Signed)
Mobility Specialist: Progress Note   07/28/21 1559  Mobility  Activity Refused mobility   Pt refused mobility stating he doesn't feel well today. Will f/u tomorrow.  [facility] Azha Constantin Mobility Specialist Mobility Specialist 5 [address]: [phone] Mobility Specialist 6 North: [phone]

## 2021-07-28 NOTE — Progress Notes (Signed)
[facility] ROUNDING NOTE   Subjective: Appetite improving a little, otherwise no changes   Objective: Vital signs in last 24 hours: Temp:  [97.9 F (36.6 C)-98.5 F (36.9 C)] 98.5 F (36.9 C) (02/11 1945) Pulse Rate:  [56-100] 75 (02/11 1945) Resp:  [16-20] 16 (02/11 1945) BP: (114-130)/(63-78) 114/78 (02/11 1945) SpO2:  [100 %] 100 % (02/11 1945) Weight:  [53.2 kg] 53.2 kg (02/11 0424) Last BM Date: 07/24/21 General: NAD, cachectic male Lungs:  CTA b/l, no w/r/r Heart:  RRR, no m/r/g Abdomen:  Scaphoid, Soft, NT, ND, +BS, pulsatile midline mass Ext:  No c/c/e    Intake/Output from previous day: 02/10 0701 - 02/11 0700 In: 147.2 [P.O.:120; I.V.:27.2] Out: 700 [Urine:700] Intake/Output this shift: Total I/O In: 150 [P.O.:150] Out: -    Lab Results: Recent Labs    07/26/21 0131 07/27/21 0139 07/28/21 0209  WBC 5.7 2.7* 3.0*  HGB 10.6* 9.3* 9.1*  PLT 85* 78* 72*  MCV 86.8 88.0 89.7   BMET Recent Labs    07/26/21 0131 07/27/21 0139 07/28/21 0209  NA 134* 135 137  K 3.7 3.6 3.2*  CL 100 98 100  CO2 27 30 31   GLUCOSE 105* 85 89  BUN 13 21 23   CREATININE 0.85 1.06 1.00  CALCIUM 9.1 9.1 9.1   LFT Recent Labs    07/26/21 0131 07/27/21 0139 07/28/21 0209  PROT 5.7* 5.2* 5.3*  ALBUMIN 2.4* 2.3* 2.3*  AST 21 32 39  ALT 10 20 24   ALKPHOS 39 54 58  BILITOT 0.7 0.3 0.3   PT/INR No results for input(s): INR in the last 72 hours.  Component 3 d ago  SURGICAL PATHOLOGY SURGICAL PATHOLOGY  CASE: [id number]  PATIENT: [name]  Surgical Pathology Report      Clinical History: biopsy of ampullary lesion found on EGD on Sept 2022  (cm)    FINAL MICROSCOPIC DIAGNOSIS:   A. DUODENUM, NODULE, BIOPSY:  -  Superficial fragments of benign duodenal mucosa  -  No acute inflammation, villous blunting or increased intraepithelial  lymphocytes identified   B. AMPULLARY MASS, BIOPSY:  -  Ampullary adenoma  -  No high-grade  dysplasia or malignancy identified    GROSS DESCRIPTION:   Specimen A: Received in formalin are tan, soft tissue fragments that are  submitted in toto. Number: 2.  Size: 0.1 and 0.4 cm.  Blocks: 1   Specimen B: Received in formalin are tan, soft tissue fragments that are  submitted in toto. Number: 3.  Size: Each 0.2 cm.  Blocks: 1   SW 07/25/2021    Final Diagnosis performed by Thressa Sheller, MD.   Electronically signed  07/26/2021  Technical and / or Professional components performed at [facility]. [facility], [place] [address] [place], [place] [address].   Immunohistochemistry Technical component (if applicable) was performed  at [facility]. [address], [place],  [place], [place] [address].   IMMUNOHISTOCHEMISTRY DISCLAIMER (if applicable):  Some of these immunohistochemical stains may have been developed and the  performance characteristics determine by [facility]. Some  may not have been cleared or approved by the U.S. Food and Drug  Administration. The FDA has determined that such clearance or approval  is not necessary. This test is used for clinical purposes. It should not  be regarded as investigational or for research. This laboratory is  certified under the [facility]  (CLIA-88) as qualified to perform high complexity  clinical laboratory  testing.  The controls stained appropriately.     Imaging/Other results: No results found.    Assessment and Plan: 76 year old male with multiple medical problems admitted with ongoing weight loss, weakness, failure to thrive, concerning for malignancy.  He has had EGD/Colonoscopy and CT abd pelvis negative for GI malignancy.  He has chronic microcystic changes that have been stable and are very unlikely to present pancreatic malignancy.  He underwent bronchoscopy for a suspicious lung lesion, with biopsies pending. 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  I discussed this result with him, and informed that him that is does not appear to be cancer, and is not likely the explanation for his weight loss and loss of appetite.  I told him that this lesion could progress into cancer, and that generally removal is recommended, but given his age and poor health, as well as the technical difficulty and risk associated with the procedure, that it may be best to leave the adenoma in place.  The results have been forwarded to Dr. Rush Landmark who will evaluate the record and make a recommendation regarding whether to attempt resection.    Daryel November, MD  07/28/2021, 9:15 PM [facility]

## 2021-07-29 ENCOUNTER — Encounter (HOSPITAL_COMMUNITY): Payer: Self-pay | Admitting: Emergency Medicine

## 2021-07-29 LAB — TECHNOLOGIST SMEAR REVIEW

## 2021-07-29 LAB — CBC
HCT: 30.3 % — ABNORMAL LOW (ref 39.0–52.0)
Hemoglobin: 9.3 g/dL — ABNORMAL LOW (ref 13.0–17.0)
MCH: 27.5 pg (ref 26.0–34.0)
MCHC: 30.7 g/dL (ref 30.0–36.0)
MCV: 89.6 fL (ref 80.0–100.0)
Platelets: 84 10*3/uL — ABNORMAL LOW (ref 150–400)
RBC: 3.38 MIL/uL — ABNORMAL LOW (ref 4.22–5.81)
RDW: 17.5 % — ABNORMAL HIGH (ref 11.5–15.5)
WBC: 3 10*3/uL — ABNORMAL LOW (ref 4.0–10.5)
nRBC: 0 % (ref 0.0–0.2)

## 2021-07-29 LAB — COMPREHENSIVE METABOLIC PANEL
ALT: 25 U/L (ref 0–44)
AST: 36 U/L (ref 15–41)
Albumin: 2.2 g/dL — ABNORMAL LOW (ref 3.5–5.0)
Alkaline Phosphatase: 58 U/L (ref 38–126)
Anion gap: 8 (ref 5–15)
BUN: 20 mg/dL (ref 8–23)
CO2: 28 mmol/L (ref 22–32)
Calcium: 9 mg/dL (ref 8.9–10.3)
Chloride: 103 mmol/L (ref 98–111)
Creatinine, Ser: 0.99 mg/dL (ref 0.61–1.24)
GFR, Estimated: >60 mL/min (ref >60)
Glucose, Bld: 125 mg/dL — ABNORMAL HIGH (ref 70–99)
Potassium: 4.1 mmol/L (ref 3.5–5.1)
Sodium: 139 mmol/L (ref 135–145)
Total Bilirubin: 0.3 mg/dL (ref 0.3–1.2)
Total Protein: 5.1 g/dL — ABNORMAL LOW (ref 6.5–8.1)

## 2021-07-29 LAB — VITAMIN B12: Vitamin B-12: 430 pg/mL (ref 180–914)

## 2021-07-29 MED ORDER — ADULT MULTIVITAMIN W/MINERALS CH
1.0000 | ORAL_TABLET | Freq: Every day | ORAL | Status: DC
  Administered 2021-07-29 – 2021-07-31 (×3): 1 via ORAL
  Filled 2021-07-29 (×3): qty 1

## 2021-07-29 MED ORDER — LACTULOSE ENEMA
300.0000 mL | Freq: Once | ORAL | Status: AC
  Administered 2021-07-29: 300 mL via RECTAL
  Filled 2021-07-29: qty 300

## 2021-07-29 MED ORDER — BISACODYL 10 MG RE SUPP: 10.0000 mg | Freq: Every day | RECTAL | Status: DC | PRN

## 2021-07-29 NOTE — Progress Notes (Signed)
HD#6 SUBJECTIVE:  Patient Summary: [name] is a 76 y.o. with pertinent PMH of pancreatic mass, Barrett's esophagus, prostate CA with BOO, lung Ca, HTN, HLD,  who presented with worsening weight loss and deconditioning and admit for dehydration and malignancy workup on hospital day 1  Overnight Events: no acute events overnight    Interm History: Patient seen and evaluated at bedsdie. No complaints. His eating is somewhat improved. He continues to have some constipation.   OBJECTIVE:  Vital Signs: Vitals:   07/28/21 1945 07/29/21 0422 07/29/21 0652 07/29/21 0729  BP: 114/78 120/64  (!) 142/88  Pulse: 75 (!) 48  72  Resp: '16 15  15  ' Temp: 98.5 F (36.9 C) 98 F (36.7 C)  (!) 97.5 F (36.4 C)  TempSrc: Oral Oral  Oral  SpO2: 100% 100%  100%  Weight:   54.6 kg   Height:       Supplemental O2: Room Air SpO2: 100 % O2 Flow Rate (L/min): 4 L/min  Filed Weights   07/25/21 0829 07/28/21 0424 07/29/21 0652  Weight: 51.8 kg 53.2 kg 54.6 kg     Intake/Output Summary (Last 24 hours) at 07/29/2021 1155 Last data filed at 07/29/2021 0900 Gross per 24 hour  Intake 670 ml  Output 316 ml  Net 354 ml    Net IO Since Admission: 1,966.09 mL [07/29/21 1155]  Physical Exam: General: chronically ill, catechetic appearing Eyes: no scleral icterus or conjunctival injection.  Cardiac: regular rate and rhythm, no lower extremity edema Pulm: breathing comfortably on room air, lung sounds are clear GI: abdomen soft, non-tender, non-distended. Hypoactive bowel sounds.  MSK: significant muscle atrophy throughout Neuro: nonfocal Skin: no rash or lesion on limited exam  Patient Lines/Drains/Airways Status     Active Line/Drains/Airways     Name Placement date Placement time Site Days   Peripheral IV 07/23/21 22 G 1.75" Anterior;Right Forearm 07/23/21  1206  Forearm  1            Pertinent Labs: CBC Latest Ref Rng & Units 07/29/2021 07/28/2021 07/27/2021  WBC 4.0 - 10.5  K/uL 3.0(L) 3.0(L) 2.7(L)  Hemoglobin 13.0 - 17.0 g/dL 9.3(L) 9.1(L) 9.3(L)  Hematocrit 39.0 - 52.0 % 30.3(L) 30.5(L) 29.4(L)  Platelets 150 - 400 K/uL 84(L) 72(L) 78(L)    CMP Latest Ref Rng & Units 07/29/2021 07/28/2021 07/27/2021  Glucose 70 - 99 mg/dL 125(H) 89 85  BUN 8 - 23 mg/dL '20 23 21  ' Creatinine 0.61 - 1.24 mg/dL 0.99 1.00 1.06  Sodium 135 - 145 mmol/L 139 137 135  Potassium 3.5 - 5.1 mmol/L 4.1 3.2(L) 3.6  Chloride 98 - 111 mmol/L 103 100 98  CO2 22 - 32 mmol/L '28 31 30  ' Calcium 8.9 - 10.3 mg/dL 9.0 9.1 9.1  Total Protein 6.5 - 8.1 g/dL 5.1(L) 5.3(L) 5.2(L)  Total Bilirubin 0.3 - 1.2 mg/dL 0.3 0.3 0.3  Alkaline Phos 38 - 126 U/L 58 58 54  AST 15 - 41 U/L 36 39 32  ALT 0 - 44 U/L '25 24 20    ' No results for input(s): GLUCAP in the last 72 hours.    Pertinent Imaging: No results found.  ASSESSMENT/PLAN:  Assessment: Principal Problem:   Abnormal CT of the chest Active Problems:   Mass of pancreas   [name] is a 76 y.o. with pertinent PMH of pancreatic mass, Barrett's esophagus, prostate CA with BOO, lung Ca, HTN, HLD,  who presented with worsening weight loss and  deconditioning and admit for dehydration and malignancy workup on hospital day 6  Unintentional weight loss, severe malnutrition, declining functional status - continue mirtazapine for appetite. EUS outpatient for adenoma per GI - Patient is s/p bronchoscopy for suspicious nodule seen on CT scan. Brushings obtained, will follow up cytology. - palliative care met with patient. He is still wanting to proceed with interventions as tolerated.  - He will likely need SNF on discharge given significant muscle wasting.    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 he was scheduled to undergo EVAR on 2/10.  - EVAR canceled, can be reassessed by vascular on Monday if patient still in hospital at that point.   Prostate cancer s/p TURP, history of bladder outlet obstruction  - patient has been retaining urine. Resistance noted  when I+O performed. Coude cath  placed. Will need TOV, this may help patient with mobilization.    Pancytopenia with normocytic anemia -  Likely 2/2 poor nutritional status. Patient noted to have low normal ferritin on recent labs and his TSAT was 16%. Given patient's age also concern for possible MDS. No evidence of bleeding at this time. Hgb stable as well as other cell counts  No signs of infection.  - F/u smear review, vit b12 - IV iron when able, iron deficiency likely secondary to poor oral intake. -Continue multivitamin  - Further work up for MDS can likely be done as outpatient.  - continue to monitor for signs of infection, bleeding, anemia  Signature: Rick Duff, MD PGY-2 Internal Medicine  Pager [phone] 11:55 AM, 07/29/2021   Please contact the on call pager after 5 pm and on weekends at [phone].

## 2021-07-29 NOTE — Progress Notes (Signed)
PCCM Interval Note  Await surgical path and cytology from right mainstem endobronchial brushings and biopsies.  Suspect these will be benign based on appearance on inspection.  Please call if we can assist over the weekend.    Baltazar Apo, MD, PhD 07/29/2021, 1:37 PM [facility] [phone] or if no answer before 7:00PM call [phone] For any issues after 7:00PM please call eLink [phone]

## 2021-07-29 NOTE — Progress Notes (Addendum)
Mobility Specialist: Progress Note   07/29/21 1221  Mobility  Bed Position Chair  Activity Ambulated with assistance in room  Level of Assistance Standby assist, set-up cues, supervision of patient - no hands on  Assistive Device Front wheel walker  Distance Ambulated (ft) 135 ft (90'+45")  Activity Response Tolerated well  $Mobility charge 1 Mobility   Received pt in bed having no complaints and agreeable to mobility. Asymptomatic throughout ambulation, returned back to chair w/ call bell in reach and all needs met. Chair alarm is on.  [facility] [name] Mobility Specialist Mobility Specialist 5 North: [phone] Mobility Specialist 6 North: [phone]

## 2021-07-30 DIAGNOSIS — R9389 Abnormal findings on diagnostic imaging of other specified body structures: Secondary | ICD-10-CM | POA: Diagnosis not present

## 2021-07-30 LAB — CREATININE, SERUM
Creatinine, Ser: 0.93 mg/dL (ref 0.61–1.24)
GFR, Estimated: >60 mL/min (ref >60)

## 2021-07-30 LAB — FOLATE: Folate: 10.9 ng/mL (ref >5.9)

## 2021-07-30 LAB — CYTOLOGY - NON PAP

## 2021-07-30 LAB — SURGICAL PATHOLOGY

## 2021-07-30 NOTE — Anesthesia Preprocedure Evaluation (Signed)
Anesthesia Evaluation  Patient identified by MRN, date of birth, ID band Patient awake    Reviewed: Allergy & Precautions, NPO status , Patient's Chart, lab work & pertinent test results  History of Anesthesia Complications Negative for: history of anesthetic complications  Airway Mallampati: II  TM Distance: >3 FB Neck ROM: Full    Dental  (+) Edentulous Upper, Edentulous Lower, Dental Advisory Given   Pulmonary COPD,  COPD inhaler, Current Smoker and Patient abstained from smoking.,  28 pack year history   Lung ca s/p lobectomy    breath sounds clear to auscultation       Cardiovascular hypertension, Pt. on medications + CAD, + Past MI, + Cardiac Stents (2004) and +CHF (grade 1 diastolic dysfunction)   Rhythm:Regular Rate:Normal  2022: 1. Left ventricular ejection fraction, by estimation, is 55 to 60%. The  left ventricle has normal function. Left ventricular endocardial border  not optimally defined to evaluate regional wall motion. There is mild left  ventricular hypertrophy. Left  ventricular diastolic parameters are consistent with Grade I diastolic  dysfunction (impaired relaxation).  2. Right ventricular systolic function was not well visualized. The right  ventricular size is not well visualized. There is normal pulmonary artery  systolic pressure. The estimated right ventricular systolic pressure is  14.2 mmHg.  3. The mitral valve is normal in structure. No evidence of mitral valve  regurgitation. No evidence of mitral stenosis.  4. The aortic valve is tricuspid. Aortic valve regurgitation is not  visualized. No aortic stenosis is present.  5. The inferior vena cava is normal in size with greater than 50%  respiratory variability, suggesting right atrial pressure of 3 mmHg.  6. Technically difficult study with poor acoustic windows.    Neuro/Psych negative psych ROS   GI/Hepatic Neg liver ROS, hiatal  hernia, GERD  Medicated and Controlled,Ampullary lesion   Endo/Other  negative endocrine ROS  Renal/GU CRFRenal disease     Musculoskeletal  (+) Arthritis ,   Abdominal   Peds  Hematology  (+) Blood dyscrasia, anemia , Hb 10.9, plt 98   Anesthesia Other Findings   Reproductive/Obstetrics                             Anesthesia Physical Anesthesia Plan  ASA: 3  Anesthesia Plan: General   Post-op Pain Management: Minimal or no pain anticipated   Induction: Intravenous  PONV Risk Score and Plan: 1 and Ondansetron and Treatment may vary due to age or medical condition  Airway Management Planned: Oral ETT  Additional Equipment: None  Intra-op Plan:   Post-operative Plan: Extubation in OR  Informed Consent: I have reviewed the patients History and Physical, chart, labs and discussed the procedure including the risks, benefits and alternatives for the proposed anesthesia with the patient or authorized representative who has indicated his/her understanding and acceptance.     Dental advisory given  Plan Discussed with: CRNA and Anesthesiologist  Anesthesia Plan Comments:         Anesthesia Quick Evaluation

## 2021-07-30 NOTE — Progress Notes (Signed)
Physical Therapy Treatment Patient Details Name: [name] MRN: [id number] DOB: 1945/10/07 Today's Date: 07/30/2021   History of Present Illness Pt is a 76 y/o male direct admit from Internal Medicine due to malnutrition and palpitable abdominal mass. CT pending. PMH includes HTN, AAA, A-fib, CAD, falls, pancreatic mass, GERD, and lung CA.    PT Comments    Pt received in supine, agreeable to therapy session with encouragement, with good participation and and improved tolerance for transfer and gait training today. Pt agreeable to attempt curb step training in room to simulate apartment entrance, needing up to minA and mod cues for safe technique for transfers, gait and stair negotiation. Pt unable to recall proper technique for Incentive Spirometer from previous session, but able to perform up to 800 mL with cues for technique when in upright posture. Encouraged him to get OOB to chair more often as he has spent many days mostly in bed. Pt continues to benefit from PT services to progress toward functional mobility goals.   Recommendations for follow up therapy are one component of a multi-disciplinary discharge planning process, led by the attending physician.  Recommendations may be updated based on patient status, additional functional criteria and insurance authorization.  Follow Up Recommendations  Skilled nursing-short term rehab (<3 hours/day)     Assistance Recommended at Discharge Frequent or constant Supervision/Assistance  Patient can return home with the following A little help with walking and/or transfers;A little help with bathing/dressing/bathroom;Assistance with cooking/housework;Direct supervision/assist for medications management;Assist for transportation;Help with stairs or ramp for entrance   Equipment Recommendations  Rolling walker (2 wheels)    Recommendations for Other Services       Precautions / Restrictions Precautions Precautions:  Fall Restrictions Weight Bearing Restrictions: No     Mobility  Bed Mobility Overal bed mobility: Needs Assistance Bed Mobility: Supine to Sit     Supine to sit: Supervision     General bed mobility comments: good initiation; pt reliant on bed rail    Transfers Overall transfer level: Needs assistance Equipment used: Rolling walker (2 wheels) Transfers: Sit to/from Stand Sit to Stand: Min assist           General transfer comment: from bed and chair heights, needs cues for hand placement/safety and up to minA for improved anterior weight shift but only min guard from recliner with arm rests    Ambulation/Gait Ambulation/Gait assistance: Min guard Gait Distance (Feet): 80 Feet (x2 with prolonged standing break) Assistive device: Rolling walker (2 wheels) Gait Pattern/deviations: Step-to pattern, Decreased stride length, Drifts right/left, Narrow base of support       General Gait Details: pt denies dizziness; pt veering toward L side of hallway and frequently needs to reposition RW, pt tending to pick RW up off floor and ignoring cues to maintain it on floor throughout; cues for activity pacing due to DOE 2/4 with exertion; slow pace, notably <0.4 m/s, variable speed   Stairs Stairs: Yes Stairs assistance: Min assist Stair Management: Step to pattern, Forwards, With walker Number of Stairs: 5 General stair comments: cues for safe step sequencing, minA for stability/RW stability stepping up onto 4" platform step to simulate curb height   Wheelchair Mobility    Modified Rankin (Stroke Patients Only)       Balance Overall balance assessment: No apparent balance deficits (not formally assessed) Sitting-balance support: Bilateral upper extremity supported   Sitting balance - Comments: pt tending to forward trunk flexion despite cues for upright posture   Standing  balance support: Reliant on assistive device for balance Standing balance-Leahy Scale:  Poor Standing balance comment: increased reliance on RW as he fatigues; min guard for safety throughout           Cognition Arousal/Alertness: Awake/alert Behavior During Therapy: WFL for tasks assessed/performed, Flat affect Overall Cognitive Status: Within Functional Limits for tasks assessed        General Comments: pt ignoring some commands and decreased safety awareness but notably improved participation this session. Pt reporting no issues reading menu but wants therapist to order dinner for him; PTA dialed # for dietary and handed phone to him to make his order, pt able to speak with person on phone but having trouble recalling what he ordered for lunch, stating he wants "a sweet salad, like fruit salad".        Exercises Other Exercises Other Exercises: IS x 10 reps, pt achieves 400-885mL    General Comments General comments (skin integrity, edema, etc.): HR to 70's to 124 bpm (noisy finger sensor signal), SpO2 97-100% on RA when signal able to be obtained      Pertinent Vitals/Pain Pain Assessment Pain Assessment: No/denies pain Faces Pain Scale: No hurt Pain Intervention(s): Monitored during session, Repositioned     PT Goals (current goals can now be found in the care plan section) Acute Rehab PT Goals Patient Stated Goal: to get stronger then go home PT Goal Formulation: With patient Time For Goal Achievement: 08/06/21 Progress towards PT goals: Progressing toward goals    Frequency    Min 2X/week      PT Plan Current plan remains appropriate       AM-PAC PT "6 Clicks" Mobility   Outcome Measure  Help needed turning from your back to your side while in a flat bed without using bedrails?: A Little Help needed moving from lying on your back to sitting on the side of a flat bed without using bedrails?: A Little Help needed moving to and from a bed to a chair (including a wheelchair)?: A Little Help needed standing up from a chair using your arms  (e.g., wheelchair or bedside chair)?: A Lot (mod safety cues) Help needed to walk in hospital room?: A Lot (mod cues for safety) Help needed climbing 3-5 steps with a railing? : A Lot (mod cues for safety) 6 Click Score: 15    End of Session   Activity Tolerance: Patient tolerated treatment well Patient left: in chair;with call bell/phone within reach;with chair alarm set;Other (comment) (encouraged him to sit fully upright in chair, pt refusing to recline legs up so chair alarm on for safety, RN aware) Nurse Communication: Mobility status;Other (comment) (pt decreased safety awareness) PT Visit Diagnosis: Unsteadiness on feet (R26.81);Muscle weakness (generalized) (M62.81);History of falling (Z91.81);Adult, failure to thrive (R62.7)     Time: 4970-2637 PT Time Calculation (min) (ACUTE ONLY): 25 min  Charges:  $Gait Training: 23-37 mins                     Deltha Bernales P., PTA Acute Rehabilitation Services Pager: [phone] Office: [place] 07/30/2021, 4:11 PM

## 2021-07-30 NOTE — Progress Notes (Signed)
° °  VASCULAR SURGERY ASSESSMENT & PLAN:   ABDOMINAL AORTIC ANEURYSM: His surgery was cancelled given his severely debilitated state. I will arrange follow-up with me in 3 months with a duplex of his AAA and reevaluate then.   Vascular surgery will be available as needed.    SUBJECTIVE:   No specific complaints.   PHYSICAL EXAM:   Vitals:   07/29/21 0729 07/29/21 1524 07/30/21 0452 07/30/21 0734  BP: (!) 142/88 (!) 153/90 134/70 113/74  Pulse: 72 (!) 51 (!) 55 78  Resp: 15 17 17 16   Temp: (!) 97.5 F (36.4 C) 97.8 F (36.6 C) 97.7 F (36.5 C) 98.5 F (36.9 C)  TempSrc: Oral Oral Oral Oral  SpO2: 100% 100% 100% 100%  Weight:      Height:       Palpable femoral pulses.   LABS:   Lab Results  Component Value Date   WBC 3.0 (L) 07/29/2021   HGB 9.3 (L) 07/29/2021   HCT 30.3 (L) 07/29/2021   MCV 89.6 07/29/2021   PLT 84 (L) 07/29/2021   Lab Results  Component Value Date   CREATININE 0.93 07/30/2021   Lab Results  Component Value Date   INR 1.1 07/23/2021   CBG (last 3)  No results for input(s): GLUCAP in the last 72 hours.  PROBLEM LIST:    Principal Problem:   Abnormal CT of the chest Active Problems:   Mass of pancreas   CURRENT MEDS:    allopurinol  100 mg Oral Daily   aspirin EC  81 mg Oral Daily   Chlorhexidine Gluconate Cloth  6 each Topical Daily   feeding supplement  237 mL Oral TID BM   lipase/protease/amylase  24,000 Units Oral TID AC   mirtazapine  7.5 mg Oral QHS   multivitamin with minerals  1 tablet Oral Daily   rosuvastatin  20 mg Oral Daily   tamsulosin  0.8 mg Oral QPM    Deitra Mayo Office: [phone] 07/30/2021

## 2021-07-30 NOTE — TOC Progression Note (Signed)
Transition of Care [facility]) - Progression Note    Patient Details  Name: [name] MRN: [id number] Date of Birth: April 21, 1946  [facility] CM/SW Contact  [name], [place] Phone Number: 07/30/2021, 1:10 PM  Clinical Narrative:     CSW spoke to pts son Ronalee Belts on phone. Ronalee Belts requested CSW send choice by text. Ronalee Belts later called CSW back and stated [facility] is fine for pt. CSW confirmed with pt. Pt is managed by regular [employer]. [place] will start insurance auth.     Barriers to Discharge: Ship broker, Continued Medical Work up  [employer] and [place] arrangements for the past 2 months: Apartment                                       Social Determinants of Health (SDOH) Interventions    Readmission Risk Interventions No flowsheet data found.  [name], LCSW Clinical Social Worker

## 2021-07-30 NOTE — Progress Notes (Signed)
[facility] PCCM  S: feels OK, breathing fine per patient  O: Vitals:   07/29/21 0729 07/29/21 1524 07/30/21 0452 07/30/21 0734  BP: (!) 142/88 (!) 153/90 134/70 113/74  Pulse: 72 (!) 51 (!) 55 78  Resp: 15 17 17 16   Temp: (!) 97.5 F (36.4 C) 97.8 F (36.6 C) 97.7 F (36.5 C) 98.5 F (36.9 C)  TempSrc: Oral Oral Oral Oral  SpO2: 100% 100% 100% 100%  Weight:      Height:       RA  General:  Resting comfortably in bed HENT: NCAT OP clear PULM: CTA B, normal effort CV: RRR, no mgr GI: BS+, soft, nontender MSK: normal bulk and tone Neuro: awake, alert, no distress, MAEW  Path from bronchoscopy: pending  Impression: 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

## 2021-07-30 NOTE — Progress Notes (Signed)
Mobility Specialist Progress Note:   07/30/21 1255  Mobility  Bed Position Chair  Activity Ambulated with assistance in hallway  Level of Assistance Standby assist, set-up cues, supervision of patient - no hands on  Assistive Device Front wheel walker  Distance Ambulated (ft) 220 ft  Activity Response Tolerated well  $Mobility charge 1 Mobility   Pt received EOB willing to participate in mobility. No complaints of pain and asymptomatic. Pt left in chair with call bell in reach and all needs met.   [facility] Public librarian Phone [phone]

## 2021-07-30 NOTE — Discharge Instructions (Addendum)
Dear Mr. Lumadue,  Thank you for trusting [place] with your care. We evaluated you in the hospital for your weight loss.  The stomach doctors performed an endoscopy and noticed some abnormal cells near your pancreas. They recommend you follow up with a stomach doctor at [facility] to discuss this finding further.  The lung doctors also performed a bronchosopy for a suspicious finding in your lungs. These did not show cancer. Please follow up with [facility], Dr. Lamonte Sakai in 3-4 weeks for outpatient spirometry and to enroll in lung cancer screening program.    We also placed a catheter to help you with urination. We are discharging you with this catheter in place. Please follow up with [facility] February 21st at [place]. They will perform a voiding trial to see how well you are able to urinate without it. The vascular surgeons evaluated you for possible repair of your aneurysm, however, they felt that you will need to build up your strength prior to them performing the procedure. They would like to see you in their office in 3 months at [facility].  We noticed that your blood counts have been low. We recommend that you follow up with a hematologist to discuss these findings further once you have been discharged. Lastly, we also recommend that you follow up with your PCP, Dr. Evette Doffing once you have been discharged from the rehab facility.

## 2021-07-30 NOTE — Discharge Summary (Addendum)
Name: [name] MRN: [id number] DOB: 05-05-1946 76 y.o. PCP: Axel Filler, MD  Date of Admission: 07/23/2021 12:25 PM Date of Discharge: 07/31/21 Attending Physician: Dr. Jimmye Norman  Discharge Diagnosis: Principal Problem:   Abnormal CT of the chest Active Problems:   Mass of pancreas    Discharge Medications: Allergies as of 07/31/2021       Reactions   Candesartan Cilexetil-hctz Other (See Comments)   Acute renal failure   Hydrochlorothiazide Other (See Comments)   Acute renal failure   Shellfish Allergy Other (See Comments)   Gout occurs        Medication List     TAKE these medications    acetaminophen 650 MG CR tablet Commonly known as: Tylenol 8 Hour Take 1 tablet (650 mg total) by mouth every 8 (eight) hours as needed for pain or fever.   albuterol 108 (90 Base) MCG/ACT inhaler Commonly known as: VENTOLIN HFA INHALE 1-2 PUFFS BY MOUTH EVERY 6 HOURS AS NEEDED FOR WHEEZE OR SHORTNESS OF BREATH What changed: See the new instructions.   allopurinol 100 MG tablet Commonly known as: ZYLOPRIM Take 100 mg by mouth daily.   amLODipine 10 MG tablet Commonly known as: NORVASC Take 10 mg by mouth daily.   aspirin 81 MG EC tablet Take 1 tablet (81 mg total) by mouth daily. Swallow whole.   bisacodyl 10 MG suppository Commonly known as: DULCOLAX Place 1 suppository (10 mg total) rectally daily as needed for moderate constipation or mild constipation.   escitalopram 5 MG tablet Commonly known as: Lexapro Take 1 tablet (5 mg total) by mouth daily.   HYDROcodone-acetaminophen 7.5-325 MG tablet Commonly known as: NORCO Take 1 tablet by mouth 2 (two) times daily as needed for moderate pain.   IRON PO Take 1 tablet by mouth daily.   metoprolol succinate 100 MG 24 hr tablet Commonly known as: TOPROL-XL Take 100 mg by mouth daily. Take with or immediately following a meal.   mirtazapine 7.5 MG tablet Commonly known as: REMERON Take 1 tablet  (7.5 mg total) by mouth at bedtime.   multivitamin with minerals Tabs tablet Take 1 tablet by mouth daily.   pantoprazole 40 MG tablet Commonly known as: Protonix Take 1 tablet (40 mg total) by mouth daily.   rosuvastatin 20 MG tablet Commonly known as: CRESTOR Take 1 tablet (20 mg total) by mouth daily.   senna 8.6 MG Tabs tablet Commonly known as: SENOKOT Take 2 tablets (17.2 mg total) by mouth in the morning and at bedtime. What changed:  how much to take when to take this reasons to take this   tamsulosin 0.4 MG Caps capsule Commonly known as: FLOMAX Take 2 capsules (0.8 mg total) by mouth every evening.   vitamin C 500 MG tablet Commonly known as: ASCORBIC ACID Take 500 mg by mouth daily.   Zenpep 20000-63000 units Cpep Generic drug: Pancrelipase (Lip-Prot-Amyl) Take 3 capsules by mouth 3 (three) times daily before meals.        Disposition and follow-up:   [name] was discharged from [facility] in Stable condition.  At the hospital follow up visit please address:  1.  Follow-up:  a. Urology for trial of void    b. Gastroenterology for follow up to discuss adenoma   c. Pulmonology - follow up with Dr. Lamonte Sakai in 3-4 weeks, outpatient spirometry and enrollment in lung cancer screening program.    d. Vascular surgery - AAA  E. PCP -  hospital follow up  2.  Labs / imaging needed at time of follow-up: CBC, CMP  3.  Pending labs/ test needing follow-up: bronchoscopy pathology  4.  Medication Changes  Started: Mirtazapine 7.5mg  QHS   Follow-up Appointments:  Follow-up Information     Axel Filler, MD. Call.   Specialty: Internal Medicine Contact information: [address] [place] [address] [phone]         [facility] Follow up in 1 week(s).   Contact information: [place] [phone]        [facility]  -[place]. Schedule an appointment as soon as possible for a visit in 3 month(s).   Specialty: Vascular Surgery Contact information: [address] [place] Follow up.          Collene Gobble, MD Follow up in 3 week(s).   Specialty: Pulmonary Disease Contact information: [place] [address] [phone]         [facility] Follow up in 1 month(s).   Specialty: Gastroenterology Contact information: [place] [phone] [place] Hospital Course by problem list:   # Patient was admitted to the hospital for further evaluation due to 40klb weight loss over the past year. He was initially evaluated with EGD due to concern for GI malignancy. EGD revelead adenoma and it was recommended that patient follow up with GI as anoutpatient to discuss management of this finding. While still admitted to the hospital, the patient was started on Mirtazapine for appetite stimulation.  A CT scan of his chest was also obtained given history of lung cancer. Revealed 1.4cm lesion in RMB. He underwent bronchoscopy for further characterization of lesion. Cytology brushing and biopsies were obtained. Cytology brushings returned negative for cancer. He will need to follow up with Dr. Lamonte Sakai in 3-4 weeks. Patient was evaluated by PT/OT. Noted to require assistance with walking and transfers as well as bathing/dress/toileting. SNF short term rehab was recommended. TOC was consulted and [facility] was chosen.   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 He was initially scheduled to have this repaired with EVAR earlier in his hospitalization, however this procedure was deferred due to patients overall frail state. He will follow up with vascular surgery in 3 months for re-evaluations.   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 He was noted to be retaining large amount of urine Initial ina and out catheter was attempted however, patient was noted to have some resistance. He does have a history of TURP and hypospadias likely contributing to difficult urination. Coude catheter was placed. He will be scheduled for urology follow up for voiding trial.   Pancytopenia - patient was noted to have pancytopenia on initial lab work. His labs remained stable and he showed no signs of anemia, bleeding, or infection. Given age, concern for possible MDS. He will need to follow up with hematology to discuss these findings.    Discharge Subjective: Feels well, no complaints.  Discharge Exam:   BP (!) 135/93 (BP Location: Right Arm)    Pulse (!) 53    Temp 98.2 F (36.8 C) (Oral)    Resp 16    Ht 6' (  1.829 m)    Wt 55.4 kg    SpO2 100%    BMI 16.56 kg/m  General: chronically ill, catechetic appearing Eyes: no scleral icterus or conjunctival injection.  Cardiac: regular rate and rhythm, no lower extremity edema Pulm: breathing comfortably on room air, lung sounds are clear GI: abdomen soft, non-tender, non-distended. Hypoactive bowel sounds. GU: hypospadias  MSK: significant muscle atrophy throughout Neuro: nonfocal Skin: no rash or lesion on limited exam  Pertinent Labs, Studies, and Procedures:  CBC Latest Ref Rng & Units 07/29/2021 07/28/2021 07/27/2021  WBC 4.0 - 10.5 K/uL 3.0(L) 3.0(L) 2.7(L)  Hemoglobin 13.0 - 17.0 g/dL 9.3(L) 9.1(L) 9.3(L)  Hematocrit 39.0 - 52.0 % 30.3(L) 30.5(L) 29.4(L)  Platelets 150 - 400 K/uL 84(L) 72(L) 78(L)    CMP Latest Ref Rng & Units 07/30/2021 07/29/2021 07/28/2021  Glucose 70 - 99 mg/dL - 125(H) 89  BUN 8 - 23 mg/dL - 20 23  Creatinine 0.61 - 1.24 mg/dL 0.93 0.99 1.00  Sodium 135 - 145 mmol/L - 139 137  Potassium 3.5 - 5.1 mmol/L - 4.1 3.2(L)  Chloride 98 - 111 mmol/L - 103 100  CO2 22 - 32 mmol/L - 28 31  Calcium 8.9 - 10.3 mg/dL -  9.0 9.1  Total Protein 6.5 - 8.1 g/dL - 5.1(L) 5.3(L)  Total Bilirubin 0.3 - 1.2 mg/dL - 0.3 0.3  Alkaline Phos 38 - 126 U/L - 58 58  AST 15 - 41 U/L - 36 39  ALT 0 - 44 U/L - 25 24    MR ABDOMEN MRCP WO CONTRAST  Result Date: 07/24/2021 CLINICAL DATA:  Unintentional weight loss with decreased appetite. 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 EXAM: MRI ABDOMEN WITHOUT CONTRAST  (INCLUDING MRCP) TECHNIQUE: Multiplanar multisequence MR imaging of the abdomen was performed. Heavily T2-weighted images of the biliary and pancreatic ducts were obtained, and three-dimensional MRCP images were rendered by post processing. COMPARISON:  Abdominopelvic CTA 06/19/2021 and 03/06/2021. Abdominal MRI 11/08/2020. FINDINGS: Despite efforts by the technologist and patient, moderate motion artifact is present on today's exam and could not be eliminated. This reduces exam sensitivity and specificity. Patient was unable to complete the examination. The MRCP images are nondiagnostic. Lower chest: The visualized lower chest appears unremarkable. There is chronic elevation of the right hemidiaphragm. Hepatobiliary: No focal hepatic abnormality identified. Gallstones are again noted without evidence of gallbladder wall thickening or significant biliary dilatation. The MRCP images are nondiagnostic due to motion. Pancreas: Pancreatic assessment limited due to motion. No pancreatic ductal dilatation or acute abnormality identified. Tiny foci of T2 hyperintensity within the pancreatic head are similar to previous MRI. Spleen: Normal in size without focal abnormality. Adrenals/Urinary Tract: Both adrenal glands appear normal. Bilateral renal cysts are again noted. Previously noted T1 hyperintense lesion in the upper pole the left kidney has decreased in size. No hydronephrosis. The bladder is partially imaged on the coronal images and appears moderately distended. Stomach/Bowel: Limited assessment. No evidence of significant bowel  distension or acute inflammation. Vascular/Lymphatic: There are no enlarged abdominal lymph nodes. Fusiform aneurysmal dilatation of the abdominal aorta is again noted with associated irregular mural thrombus. The aorta measures up to 6.6 x 5.7 cm transverse on image 34/4 (compared with 6.2 x 5.5 cm on most recent CT). Measured in the coronal plane, the aorta measures 5.9 cm, which is unchanged from the prior CT. This aneurysm has been shown to be gradually enlarging compared with older prior studies. No definite signs of acute rupture. Other: No significant ascites. Musculoskeletal:  No acute or significant osseous findings. Multilevel spondylosis. IMPRESSION: 1. Study is significantly limited by motion artifact. The MRCP images are nondiagnostic, and there is limited assessment of the pancreas. 2. Cholelithiasis without evidence of cholecystitis or biliary dilatation. 3. Tiny T2 hyperintense lesions within the pancreatic head are similar to previous MRI, but suboptimally evaluated due to motion. If determine to require additional follow-up clinically, this would be best performed with CT in this patient due to the limitations of MRI. 4. Slowly enlarging abdominal aortic aneurysm as evaluated on CTA last month. Vascular surgery consultation recommended due to increased risk of rupture for AAA >5.5 cm. This recommendation follows ACR consensus. This recommendation follows ACR consensus guidelines: White Paper of the ACR Incidental Findings Committee II on Vascular Findings. J Am Coll Radiol 2013; 10:789-794. Electronically Signed   By: Richardean Sale M.D.   On: 07/24/2021 13:30   MR 3D Recon At Scanner  Result Date: 07/24/2021 CLINICAL DATA:  Unintentional weight loss with decreased appetite. 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 EXAM: MRI ABDOMEN WITHOUT CONTRAST  (INCLUDING MRCP) TECHNIQUE: Multiplanar multisequence MR imaging of the abdomen was performed. Heavily T2-weighted images of the biliary and pancreatic  ducts were obtained, and three-dimensional MRCP images were rendered by post processing. COMPARISON:  Abdominopelvic CTA 06/19/2021 and 03/06/2021. Abdominal MRI 11/08/2020. FINDINGS: Despite efforts by the technologist and patient, moderate motion artifact is present on today's exam and could not be eliminated. This reduces exam sensitivity and specificity. Patient was unable to complete the examination. The MRCP images are nondiagnostic. Lower chest: The visualized lower chest appears unremarkable. There is chronic elevation of the right hemidiaphragm. Hepatobiliary: No focal hepatic abnormality identified. Gallstones are again noted without evidence of gallbladder wall thickening or significant biliary dilatation. The MRCP images are nondiagnostic due to motion. Pancreas: Pancreatic assessment limited due to motion. No pancreatic ductal dilatation or acute abnormality identified. Tiny foci of T2 hyperintensity within the pancreatic head are similar to previous MRI. Spleen: Normal in size without focal abnormality. Adrenals/Urinary Tract: Both adrenal glands appear normal. Bilateral renal cysts are again noted. Previously noted T1 hyperintense lesion in the upper pole the left kidney has decreased in size. No hydronephrosis. The bladder is partially imaged on the coronal images and appears moderately distended. Stomach/Bowel: Limited assessment. No evidence of significant bowel distension or acute inflammation. Vascular/Lymphatic: There are no enlarged abdominal lymph nodes. Fusiform aneurysmal dilatation of the abdominal aorta is again noted with associated irregular mural thrombus. The aorta measures up to 6.6 x 5.7 cm transverse on image 34/4 (compared with 6.2 x 5.5 cm on most recent CT). Measured in the coronal plane, the aorta measures 5.9 cm, which is unchanged from the prior CT. This aneurysm has been shown to be gradually enlarging compared with older prior studies. No definite signs of acute rupture.  Other: No significant ascites. Musculoskeletal: No acute or significant osseous findings. Multilevel spondylosis. IMPRESSION: 1. Study is significantly limited by motion artifact. The MRCP images are nondiagnostic, and there is limited assessment of the pancreas. 2. Cholelithiasis without evidence of cholecystitis or biliary dilatation. 3. Tiny T2 hyperintense lesions within the pancreatic head are similar to previous MRI, but suboptimally evaluated due to motion. If determine to require additional follow-up clinically, this would be best performed with CT in this patient due to the limitations of MRI. 4. Slowly enlarging abdominal aortic aneurysm as evaluated on CTA last month. Vascular surgery consultation recommended due to increased risk of rupture for AAA >5.5 cm. This recommendation follows ACR  consensus. This recommendation follows ACR consensus guidelines: White Paper of the ACR Incidental Findings Committee II on Vascular Findings. J Am Coll Radiol 2013; 10:789-794. Electronically Signed   By: Richardean Sale M.D.   On: 07/24/2021 13:30     Discharge Instructions: Discharge Instructions     Call MD for:  difficulty breathing, headache or visual disturbances   Complete by: As directed    Call MD for:  difficulty breathing, headache or visual disturbances   Complete by: As directed    Call MD for:  extreme fatigue   Complete by: As directed    Call MD for:  extreme fatigue   Complete by: As directed    Call MD for:  hives   Complete by: As directed    Call MD for:  hives   Complete by: As directed    Call MD for:  persistant dizziness or light-headedness   Complete by: As directed    Call MD for:  persistant dizziness or light-headedness   Complete by: As directed    Call MD for:  persistant nausea and vomiting   Complete by: As directed    Call MD for:  persistant nausea and vomiting   Complete by: As directed    Call MD for:  redness, tenderness, or signs of infection (pain,  swelling, redness, odor or green/yellow discharge around incision site)   Complete by: As directed    Call MD for:  redness, tenderness, or signs of infection (pain, swelling, redness, odor or green/yellow discharge around incision site)   Complete by: As directed    Call MD for:  severe uncontrolled pain   Complete by: As directed    Call MD for:  severe uncontrolled pain   Complete by: As directed    Call MD for:  temperature >100.4   Complete by: As directed    Call MD for:  temperature >100.4   Complete by: As directed    Diet - low sodium heart healthy   Complete by: As directed    Diet - low sodium heart healthy   Complete by: As directed    Increase activity slowly   Complete by: As directed    Increase activity slowly   Complete by: As directed        Signed: Delene Ruffini, MD 07/31/2021, 1:47 PM   Pager: [phone]

## 2021-07-30 NOTE — Anesthesia Postprocedure Evaluation (Signed)
Anesthesia Post Note  Patient: [name]  Procedure(s) Performed: VIDEO BRONCHOSCOPY WITHOUT FLUORO BRONCHIAL BRUSHINGS BRONCHIAL BIOPSIES     Patient location during evaluation: PACU Anesthesia Type: General Level of consciousness: patient cooperative and awake Pain management: pain level controlled Vital Signs Assessment: post-procedure vital signs reviewed and stable Respiratory status: spontaneous breathing, nonlabored ventilation, respiratory function stable and patient connected to nasal cannula oxygen Cardiovascular status: blood pressure returned to baseline and stable Postop Assessment: no apparent nausea or vomiting Anesthetic complications: no   No notable events documented.  Last Vitals:  Vitals:   07/30/21 0452 07/30/21 0734  BP: 134/70 113/74  Pulse: (!) 55 78  Resp: 17 16  Temp: 36.5 C 36.9 C  SpO2: 100% 100%    Last Pain:  Vitals:   07/30/21 0734  TempSrc: Oral  PainSc:                  Deannie Resetar

## 2021-07-30 NOTE — Progress Notes (Addendum)
HD#7 SUBJECTIVE:  Patient Summary: [name] is a 76 y.o. with pertinent PMH of pancreatic mass, Barrett's esophagus, prostate CA with BOO, lung Ca, HTN, HLD,  who presented with worsening weight loss and deconditioning and admit for dehydration and malignancy workup on hospital day 1  Overnight Events: no acute events overnight    Interm History: Patient seen and evaluated at bedsdie. Says he is doing well. No complaints.    OBJECTIVE:  Vital Signs: Vitals:   07/29/21 0729 07/29/21 1524 07/30/21 0452 07/30/21 0734  BP: (!) 142/88 (!) 153/90 134/70 113/74  Pulse: 72 (!) 51 (!) 55 78  Resp: _0 Temp: (!) 97.5 F (36.4 C) 97.8 F (36.6 C) 97.7 F (36.5 C) 98.5 F (36.9 C)  TempSrc: Oral Oral Oral Oral  SpO2: 100% 100% 100% 100%  Weight:      Height:       Supplemental O2: Room Air SpO2: 100 % O2 Flow Rate (L/min): 4 L/min  Filed Weights   07/25/21 0829 07/28/21 0424 07/29/21 0652  Weight: 51.8 kg 53.2 kg 54.6 kg     Intake/Output Summary (Last 24 hours) at 07/30/2021 1219 Last data filed at 07/29/2021 1741 Gross per 24 hour  Intake 480 ml  Output 500 ml  Net -20 ml    Net IO Since Admission: 1,597.09 mL [07/30/21 1219]  Physical Exam: General: chronically ill, catechetic appearing Eyes: no scleral icterus or conjunctival injection.  Cardiac: regular rate and rhythm, no lower extremity edema Pulm: breathing comfortably on room air, lung sounds are clear GI: abdomen soft, non-tender, non-distended. Hypoactive bowel sounds.  MSK: significant muscle atrophy throughout Neuro: nonfocal Skin: no rash or lesion on limited exam  Patient Lines/Drains/Airways Status     Active Line/Drains/Airways     Name Placement date Placement time Site Days   Peripheral IV 07/23/21 22 G 1.75" Anterior;Right Forearm 07/23/21  1206  Forearm  1            Pertinent Labs: CBC Latest Ref Rng & Units 07/29/2021 07/28/2021 07/27/2021  WBC 4.0 - 10.5 K/uL 3.0(L)  3.0(L) 2.7(L)  Hemoglobin 13.0 - 17.0 g/dL 9.3(L) 9.1(L) 9.3(L)  Hematocrit 39.0 - 52.0 % 30.3(L) 30.5(L) 29.4(L)  Platelets 150 - 400 K/uL 84(L) 72(L) 78(L)    CMP Latest Ref Rng & Units 07/30/2021 07/29/2021 07/28/2021  Glucose 70 - 99 mg/dL - 125(H) 89  BUN 8 - 23 mg/dL - 20 23  Creatinine 0.61 - 1.24 mg/dL 0.93 0.99 1.00  Sodium 135 - 145 mmol/L - 139 137  Potassium 3.5 - 5.1 mmol/L - 4.1 3.2(L)  Chloride 98 - 111 mmol/L - 103 100  CO2 22 - 32 mmol/L - 28 31  Calcium 8.9 - 10.3 mg/dL - 9.0 9.1  Total Protein 6.5 - 8.1 g/dL - 5.1(L) 5.3(L)  Total Bilirubin 0.3 - 1.2 mg/dL - 0.3 0.3  Alkaline Phos 38 - 126 U/L - 58 58  AST 15 - 41 U/L - 36 39  ALT 0 - 44 U/L - 25 24    No results for input(s): GLUCAP in the last 72 hours.    Pertinent Imaging: No results found.  ASSESSMENT/PLAN:  Assessment: Principal Problem:   Abnormal CT of the chest Active Problems:   Mass of pancreas   [name] is a 76 y.o. with pertinent PMH of pancreatic mass, Barrett's esophagus, prostate CA with BOO, lung Ca, HTN, HLD,  who presented with worsening weight loss and deconditioning  and admit for dehydration and malignancy workup on hospital day 7  Unintentional weight loss, severe malnutrition, declining functional status - continue mirtazapine for appetite. EUS outpatient for adenoma per GI - Patient is s/p bronchoscopy for suspicious nodule seen on CT scan. Brushings obtained, will follow up cytology. - palliative care met with patient. He is still wanting to proceed with interventions as tolerated.  - He will likely need SNF on discharge given significant muscle wasting. He has bed offers, however, waiting on son to pick location. Insurance Josem Kaufmann will be started after that. Anticipate approval tomorrow and D/C likely tomorrow.   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 he was scheduled to undergo EVAR on 2/10.  - EVAR canceled. Patient to follow up in 3 months with vascular for re-evaluation.    Prostate  cancer s/p TURP, history of bladder outlet obstruction  - patient has been retaining urine. Resistance noted when I+O performed. Coude cath  placed. Will need TOV, this may help patient with mobilization.    Pancytopenia with normocytic anemia -  Likely 2/2 poor nutritional status. Patient noted to have low normal ferritin on recent labs and his TSAT was 16%. Given patient's age also concern for possible MDS. No evidence of bleeding at this time. Hgb stable as well as other cell counts  No signs of infection.  - F/u smear review, vit b12 - IV iron when able, iron deficiency likely secondary to poor oral intake. -Continue multivitamin  - Further work up for MDS can likely be done as outpatient.  - continue to monitor for signs of infection, bleeding, anemia  Signature: Delene Ruffini, PGY1 Internal Medicine  Pager [phone] 12:19 PM, 07/30/2021   Please contact the on call pager after 5 pm and on weekends at [phone].

## 2021-07-31 ENCOUNTER — Other Ambulatory Visit: Payer: Self-pay

## 2021-07-31 DIAGNOSIS — R9389 Abnormal findings on diagnostic imaging of other specified body structures: Secondary | ICD-10-CM | POA: Diagnosis not present

## 2021-07-31 DIAGNOSIS — R918 Other nonspecific abnormal finding of lung field: Secondary | ICD-10-CM | POA: Diagnosis not present

## 2021-07-31 DIAGNOSIS — K8689 Other specified diseases of pancreas: Secondary | ICD-10-CM | POA: Diagnosis not present

## 2021-07-31 DIAGNOSIS — D490 Neoplasm of unspecified behavior of digestive system: Secondary | ICD-10-CM | POA: Diagnosis not present

## 2021-07-31 DIAGNOSIS — E46 Unspecified protein-calorie malnutrition: Secondary | ICD-10-CM | POA: Diagnosis not present

## 2021-07-31 DIAGNOSIS — D61818 Other pancytopenia: Secondary | ICD-10-CM | POA: Diagnosis not present

## 2021-07-31 DIAGNOSIS — E43 Unspecified severe protein-calorie malnutrition: Secondary | ICD-10-CM | POA: Diagnosis not present

## 2021-07-31 DIAGNOSIS — R338 Other retention of urine: Secondary | ICD-10-CM | POA: Diagnosis not present

## 2021-07-31 DIAGNOSIS — I7143 Infrarenal abdominal aortic aneurysm, without rupture: Secondary | ICD-10-CM | POA: Diagnosis not present

## 2021-07-31 DIAGNOSIS — M6281 Muscle weakness (generalized): Secondary | ICD-10-CM | POA: Diagnosis not present

## 2021-07-31 DIAGNOSIS — I10 Essential (primary) hypertension: Secondary | ICD-10-CM | POA: Diagnosis not present

## 2021-07-31 DIAGNOSIS — N320 Bladder-neck obstruction: Secondary | ICD-10-CM | POA: Diagnosis not present

## 2021-07-31 DIAGNOSIS — Z7401 Bed confinement status: Secondary | ICD-10-CM | POA: Diagnosis not present

## 2021-07-31 DIAGNOSIS — R531 Weakness: Secondary | ICD-10-CM | POA: Diagnosis not present

## 2021-07-31 DIAGNOSIS — F1010 Alcohol abuse, uncomplicated: Secondary | ICD-10-CM | POA: Diagnosis not present

## 2021-07-31 DIAGNOSIS — J984 Other disorders of lung: Secondary | ICD-10-CM | POA: Diagnosis not present

## 2021-07-31 DIAGNOSIS — K8681 Exocrine pancreatic insufficiency: Secondary | ICD-10-CM | POA: Diagnosis not present

## 2021-07-31 DIAGNOSIS — I4891 Unspecified atrial fibrillation: Secondary | ICD-10-CM | POA: Diagnosis not present

## 2021-07-31 LAB — RESP PANEL BY RT-PCR (FLU A&B, COVID) ARPGX2
Influenza A by PCR: NEGATIVE
Influenza B by PCR: NEGATIVE
SARS Coronavirus 2 by RT PCR: NEGATIVE

## 2021-07-31 MED ORDER — ASPIRIN 81 MG PO TBEC: 81.0000 mg | DELAYED_RELEASE_TABLET | Freq: Every day | ORAL | 11 refills | Status: DC

## 2021-07-31 MED ORDER — MIRTAZAPINE 7.5 MG PO TABS: 7.5000 mg | ORAL_TABLET | Freq: Every day | ORAL | Status: DC

## 2021-07-31 MED ORDER — BISACODYL 10 MG RE SUPP: 10.0000 mg | Freq: Every day | RECTAL | 0 refills | Status: DC | PRN

## 2021-07-31 NOTE — Hospital Course (Signed)
NO evidence of pressure wounds on heels.

## 2021-07-31 NOTE — Progress Notes (Signed)
Nutrition Follow-up  DOCUMENTATION CODES:  Severe malnutrition in context of social or environmental circumstances, Underweight  INTERVENTION:  Continue regular diet, encourage PO intake Continue Ensure Enlive po TID, each supplement provides 350 kcal and 20 grams of protein.  NUTRITION DIAGNOSIS:  Severe Malnutrition (in the context of social and environmental circumstances) related to poor appetite as evidenced by severe muscle depletion, severe fat depletion, percent weight loss (17.4% x 3 months). - ongoing  GOAL:  Patient will meet greater than or equal to 90% of their needs, Weight gain - regular diet, supplements in place  MONITOR:  PO intake, Supplement acceptance  REASON FOR ASSESSMENT:  Consult Assessment of nutrition requirement/status  ASSESSMENT:  76 year old male with hx of gout, HTN, HLD, barretts esophagus, tobacco abuse, EtOH abuse, full dentures, lung cancer, GERD, hx of MI, and CAD presented to ED from PCP office for declining functional status and unintentional weight loss  2/8 - Upper edoscopy 2/10 - bronchoscopy   Admit weight: 51.8 kg Current weight: 55.4 kg  Pt resting in bed at the time of assessment, in good spirits today. Reports that he had a good breakfast this AM of eggs, bacon, and milk. States he would like to have something sweet now like cake. Assisted pt with ordering his lunch.   Pt reports that he continues to drink the ensures TID. Discussed weight and his modest gain this admission, pt states he noticed he has gained a little. States he is feeling well today. Noted that mirtazapine added this admission - reported to MD that it has helped with his appetite.  Average Meal Intake: 2/6-2/9: 20% intake x 3 recorded meals 2/10-2/14: 39% intake x 4 recorded meals  Nutritionally Relevant Medications: Scheduled Meds:  feeding supplement  237 mL Oral TID BM   lipase/protease/amylase  24,000 Units Oral TID AC   mirtazapine  7.5 mg Oral QHS    multivitamin with minerals  1 tablet Oral Daily   rosuvastatin  20 mg Oral Daily   PRN Meds:.bisacodyl, ondansetron   Labs Reviewed  NUTRITION - FOCUSED PHYSICAL EXAM: Flowsheet Row Most Recent Value  Orbital Region Severe depletion  Upper Arm Region Severe depletion  Thoracic and Lumbar Region Severe depletion  Buccal Region Severe depletion  Temple Region Severe depletion  Clavicle Bone Region Severe depletion  Clavicle and Acromion Bone Region Severe depletion  Scapular Bone Region Severe depletion  Dorsal Hand Severe depletion  Patellar Region Severe depletion  Anterior Thigh Region Severe depletion  Posterior Calf Region Severe depletion  Edema (RD Assessment) None  Hair Reviewed  Eyes Reviewed  Mouth Reviewed  Skin Reviewed  Nails Reviewed   Diet Order:   Diet Order             Diet - low sodium heart healthy           Diet regular Room service appropriate? Yes; Fluid consistency: Thin  Diet effective now                   EDUCATION NEEDS:  No education needs have been identified at this time  Skin:  Skin Assessment: Reviewed RN Assessment  Last BM:  2/12 - type 1   Height:  Ht Readings from Last 1 Encounters:  07/25/21 6' (1.829 m)    Weight:  Wt Readings from Last 1 Encounters:  07/31/21 55.4 kg    Ideal Body Weight:  89.1 kg  BMI:  Body mass index is 16.56 kg/m.  Estimated Nutritional Needs:  Kcal:  1800-2000 kcal/d Protein:  90-100 g/d Fluid:  1.8-2 L/d   Ranell Patrick, RD, LDN Clinical Dietitian RD pager # available in [facility]  After hours/weekend pager # available in [facility]

## 2021-07-31 NOTE — Care Management Important Message (Signed)
Important Message  Patient Details  Name: [name] MRN: [id number] Date of Birth: February 18, 1946   Medicare Important Message Given:  Yes     Hannah Beat 07/31/2021, 1:55 PM

## 2021-07-31 NOTE — Progress Notes (Signed)
Mobility Specialist Progress Note:   07/31/21 1305  Mobility  Activity Refused mobility   Pt stating he's being discharged and doesn't want to mobilize.   [facility] Public librarian Phone [phone]

## 2021-07-31 NOTE — Progress Notes (Signed)
[facility] PCCM  S:  Feels OK  O: Vitals:   07/30/21 1639 07/30/21 2038 07/31/21 0414 07/31/21 0738  BP: (!) 142/98 108/77 117/72 (!) 135/93  Pulse: (!) 57 90 70 (!) 53  Resp: 16   16  Temp: 97.6 F (36.4 C) 98.4 F (36.9 C) 98.4 F (36.9 C) 98.2 F (36.8 C)  TempSrc: Oral Oral Oral Oral  SpO2: 98% 100% 100% 100%  Weight:   55.4 kg   Height:       RA  General:  Resting comfortably in bed HENT: NCAT OP clear PULM: Diminished breath sounds, normal effort CV: RRR, no mgr GI: BS+, soft, nontender MSK: normal bulk and tone Neuro: awake, alert, no distress, MAEW   Path from bronchoscopy: negative for malignancy Cytology from bronchoscopy brushing: negative for malignancy  Impression: 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 Lamonte Sakai in 3-4 weeks Continue albuterol prn Consider outpatient spirometry and enrollment in lung cancer screening program  OK to d/c home from PCCM perspective  Pulmonary will sign off  Roselie Awkward, MD [place] [facility]M Pager: [phone] Cell: [phone] After 7:00 pm call Elink  [phone]

## 2021-07-31 NOTE — Consult Note (Signed)
° °  [facility] Inpatient Consult   07/31/2021  [name] August 08, 1945 [id number]  [place] [facility] [ACO] Patient: [name] Medicare  Primary Care Provider:  Axel Filler, MD, [facility] Internal Medicine is an embedded provider with a Chronic Care Management team and program, and is listed for the transition of care follow up and appointments.  Patient was screened disposition with 3 hospitalizations and 3 EDs at [facility] and also to review for Embedded practice service needs for chronic care management. Rview reveals that patient is being recommended for a skilled nursing facility  level of care for post hospital needs.   Plan: Currently patient's needs for post hospital is to be met at a skilled nursing facility level of care. No [facility] Care Management follow up needed.  Please contact for further questions,  Natividad Brood, RN BSN [facility] Liaison  [phone] business mobile phone Toll free office [phone]  Fax number: [phone] [email or web address]

## 2021-07-31 NOTE — TOC Transition Note (Signed)
Transition of Care [facility]) - CM/SW Discharge Note   Patient Details  Name: [name] MRN: [id number] Date of Birth: 01/12/1946  Transition of Care [facility]) CM/SW Contact:  [name], LCSW Phone Number: 07/31/2021, 11:03 AM   Clinical Narrative:      Per MD patient ready for DC to [facility]. RN, patient, patient's family, and facility notified of DC. Discharge Summary and FL2 sent to facility. DC packet on chart. Insurance Josem Kaufmann has been received and pt is covid negative. Ambulance transport requested for patient.    RN to call report to [phone].  CSW will sign off for now as social work intervention is no longer needed. Please consult [place] again if new needs arise.   Final next level of care: Skilled Nursing Facility Barriers to Discharge: Barriers Resolved   Patient Goals and CMS Choice Patient states their goals for this hospitalization and ongoing recovery are:: to build strenght CMS Medicare.gov Compare Post Acute Care list provided to:: Patient Choice offered to / list presented to : Patient  Discharge Placement              Patient chooses bed at: [facility] Patient to be transferred to facility by: [facility] Name of family member notified: son, Ronalee Belts Patient and family notified of of transfer: 07/31/21  Discharge Plan and Services                                     Social Determinants of Health (SDOH) Interventions     Readmission Risk Interventions No flowsheet data found.   [name], LCSW Clinical Social Worker

## 2021-07-31 NOTE — Progress Notes (Signed)
[facility] arrived to transfer pt to [place] care belongings sent with pt

## 2021-07-31 NOTE — Progress Notes (Signed)
Occupational Therapy Treatment Patient Details Name: [name] MRN: [id number] DOB: 09/10/1945 Today's Date: 07/31/2021   History of present illness Pt is a 76 y/o male direct admit from Internal Medicine due to malnutrition and palpitable abdominal mass. CT pending. PMH includes HTN, AAA, A-fib, CAD, falls, pancreatic mass, GERD, and lung CA.   OT comments  Pt progressing towards acute OT goals. Focus of session was building activity tolerance during ADLs. Pt completed household distance functional mobility (about 80') utilizing rw, slow gait pattern, frequent pauses and 2 standing rest breaks. Pt up in recliner at end of session. D/c recommendation remains appropriate.    Recommendations for follow up therapy are one component of a multi-disciplinary discharge planning process, led by the attending physician.  Recommendations may be updated based on patient status, additional functional criteria and insurance authorization.    Follow Up Recommendations  Skilled nursing-short term rehab (<3 hours/day)    Assistance Recommended at Discharge Frequent or constant Supervision/Assistance  Patient can return home with the following  A lot of help with bathing/dressing/bathroom;A little help with walking and/or transfers;Assistance with cooking/housework;Assist for transportation   Equipment Recommendations  Other (comment) (TBD next venue)    Recommendations for Other Services      Precautions / Restrictions Precautions Precautions: Fall Precaution Comments: othrostatic in prior tx sessions Restrictions Weight Bearing Restrictions: No       Mobility Bed Mobility Overal bed mobility: Needs Assistance Bed Mobility: Supine to Sit     Supine to sit: Supervision          Transfers Overall transfer level: Needs assistance Equipment used: Rolling walker (2 wheels) Transfers: Sit to/from Stand Sit to Stand: Min guard           General transfer comment: to/from EOB and  recliner. cues for technique with rw.     Balance Overall balance assessment: Needs assistance Sitting-balance support: Bilateral upper extremity supported Sitting balance-Leahy Scale: Good Sitting balance - Comments: pt tending to forward trunk flexion   Standing balance support: Bilateral upper extremity supported, Reliant on assistive device for balance Standing balance-Leahy Scale: Poor                             ADL either performed or assessed with clinical judgement   ADL Overall ADL's : Needs assistance/impaired                         Toilet Transfer: Min guard;Ambulation Toilet Transfer Details (indicate cue type and reason): cues for technique with rw         Functional mobility during ADLs: Rolling walker (2 wheels);Min guard General ADL Comments: Pt completed household distance functional mobilty and simulated toilet transfer at min guard level.  Pt noted to have decreased speed and effortful appearance to gait. 2 standing rest breaks, frequent standing pauses.    Extremity/Trunk Assessment Upper Extremity Assessment Upper Extremity Assessment: Generalized weakness   Lower Extremity Assessment Lower Extremity Assessment: Defer to PT evaluation        Vision       Perception     Praxis      Cognition Arousal/Alertness: Awake/alert Behavior During Therapy: Flat affect, WFL for tasks assessed/performed Overall Cognitive Status: No family/caregiver present to determine baseline cognitive functioning  General Comments: decreased problem solving. delayed responses.        Exercises      Shoulder Instructions       General Comments      Pertinent Vitals/ Pain       Pain Assessment Pain Assessment: No/denies pain  Home Living                                          Prior Functioning/Environment              Frequency  Min 2X/week        Progress  Toward Goals  OT Goals(current goals can now be found in the care plan section)  Progress towards OT goals: Progressing toward goals  Acute Rehab OT Goals Patient Stated Goal: to be able to eat again OT Goal Formulation: With patient Time For Goal Achievement: 08/07/21 Potential to Achieve Goals: Good ADL Goals Pt Will Perform Grooming: Independently;standing Pt Will Perform Upper Body Bathing: Independently;sitting;standing Pt Will Perform Lower Body Bathing: Independently;sit to/from stand Pt Will Perform Upper Body Dressing: Independently;sitting Pt Will Perform Lower Body Dressing: Independently;sit to/from stand Pt Will Transfer to Toilet: with modified independence;ambulating;bedside commode (over toilet) Pt Will Perform Toileting - Clothing Manipulation and hygiene: with modified independence;sit to/from stand  Plan Discharge plan remains appropriate    Co-evaluation                 AM-PAC OT "6 Clicks" Daily Activity     Outcome Measure   Help from another person eating meals?: A Little Help from another person taking care of personal grooming?: A Little Help from another person toileting, which includes using toliet, bedpan, or urinal?: A Little Help from another person bathing (including washing, rinsing, drying)?: A Lot Help from another person to put on and taking off regular upper body clothing?: A Little Help from another person to put on and taking off regular lower body clothing?: A Lot 6 Click Score: 16    End of Session Equipment Utilized During Treatment: Rolling walker (2 wheels)  OT Visit Diagnosis: Unsteadiness on feet (R26.81);Muscle weakness (generalized) (M62.81)   Activity Tolerance Patient tolerated treatment well;Patient limited by fatigue   Patient Left in chair;with call bell/phone within reach   Nurse Communication          Time: 6578-4696 OT Time Calculation (min): 19 min  Charges: OT General Charges $OT Visit: 1 Visit OT  Treatments $Self Care/Home Management : 8-22 mins  Tyrone Schimke, OT Acute Rehabilitation Services Office: [phone]   Hortencia Pilar 07/31/2021, 10:16 AM

## 2021-08-03 ENCOUNTER — Telehealth: Payer: Self-pay | Admitting: Emergency Medicine

## 2021-08-03 NOTE — Telephone Encounter (Signed)
-----   Message from Juanito Doom, MD sent at 07/31/2021  8:32 AM EST ----- Hi,  Please arrange follow up in 3-4 weeks with Dr. Lamonte Sakai re follow up bronchoscopy; emphysema.  Ruby Cola

## 2021-08-03 NOTE — Telephone Encounter (Signed)
Patient has an OV with Dr. Lamonte Sakai on 08/23/21. Nothing further needed.

## 2021-08-06 ENCOUNTER — Encounter: Payer: Medicare HMO | Admitting: Student in an Organized Health Care Education/Training Program

## 2021-08-07 DIAGNOSIS — R338 Other retention of urine: Secondary | ICD-10-CM | POA: Diagnosis not present

## 2021-08-13 DIAGNOSIS — F1010 Alcohol abuse, uncomplicated: Secondary | ICD-10-CM | POA: Diagnosis not present

## 2021-08-13 DIAGNOSIS — I10 Essential (primary) hypertension: Secondary | ICD-10-CM | POA: Diagnosis not present

## 2021-08-13 DIAGNOSIS — D490 Neoplasm of unspecified behavior of digestive system: Secondary | ICD-10-CM | POA: Diagnosis not present

## 2021-08-13 DIAGNOSIS — M6281 Muscle weakness (generalized): Secondary | ICD-10-CM | POA: Diagnosis not present

## 2021-08-13 DIAGNOSIS — K8681 Exocrine pancreatic insufficiency: Secondary | ICD-10-CM | POA: Diagnosis not present

## 2021-08-13 DIAGNOSIS — E46 Unspecified protein-calorie malnutrition: Secondary | ICD-10-CM | POA: Diagnosis not present

## 2021-08-13 DIAGNOSIS — I4891 Unspecified atrial fibrillation: Secondary | ICD-10-CM | POA: Diagnosis not present

## 2021-08-20 ENCOUNTER — Telehealth: Payer: Self-pay | Admitting: Student in an Organized Health Care Education/Training Program

## 2021-08-20 NOTE — Telephone Encounter (Signed)
Returned call to Hermenia Fiscal, RN with [facility]. Confirmed frequency and what she is working on below. Verbal auth given. Will route to PCP for agreement/denial. ? ?Also, some [facility] have started doing pill packs. Olin Hauser will have patient's sister call [facility] to see if this can be done. If not, she will let us know which pharmacy to send meds to. ?

## 2021-08-20 NOTE — Telephone Encounter (Signed)
[place] Nurse Also requesting Bubble Pill Pack  from the  pharmacy. ?

## 2021-08-20 NOTE — Telephone Encounter (Signed)
[facility] RN Hermenia Fiscal [phone] ?Requesting VO ? ?Skilled Nursing, Medication Management, Care of Foley Cath needed as well.  ? ?Frequency ? ?1 week 1 ?2 week 2 ?1 week 1 ?2 month 1 ?2 PRN as needed ? ?

## 2021-08-20 NOTE — Telephone Encounter (Signed)
Sounds like a good idea, thank you. ?

## 2021-08-22 ENCOUNTER — Telehealth: Payer: Self-pay | Admitting: *Deleted

## 2021-08-22 NOTE — Telephone Encounter (Signed)
Absolutely agree, thank you. ?

## 2021-08-22 NOTE — Telephone Encounter (Signed)
Call from [facility] PT would like to get Verbal order for PT 1 time today  and then 2 times weekly for 8 weeks .  Reason:  Severe weakness, Malnutrition and Risk for Falls.  Message to be sent to Dr. Evette Doffing PCP for approval.   ?

## 2021-08-23 ENCOUNTER — Other Ambulatory Visit: Payer: Self-pay

## 2021-08-23 ENCOUNTER — Encounter: Payer: Self-pay | Admitting: Emergency Medicine

## 2021-08-23 ENCOUNTER — Ambulatory Visit (INDEPENDENT_AMBULATORY_CARE_PROVIDER_SITE_OTHER): Payer: Medicare HMO | Admitting: Emergency Medicine

## 2021-08-23 DIAGNOSIS — F17200 Nicotine dependence, unspecified, uncomplicated: Secondary | ICD-10-CM

## 2021-08-23 DIAGNOSIS — R9389 Abnormal findings on diagnostic imaging of other specified body structures: Secondary | ICD-10-CM | POA: Diagnosis not present

## 2021-08-23 NOTE — Progress Notes (Signed)
Subjective:    Patient ID: [name], male    DOB: 08/17/1945, 76 y.o.   MRN: [id number]  HPI 76 year old man with history of active tobacco use (40-50 pack years), CAD, hypertension, hyperlipidemia, chronic right lower extremity osteomyelitis, colon polyps and PUD/GERD.  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  I saw him when he was admitted to the hospital in February to evaluate an abnormal CT scan of the chest.  This showed the appearance of some anterior nodularity and protrusion at the right mainstem bronchus, possible soft tissue density in this region.  I performed bronchoscopy on 07/27/2021 and inspection showed that this appeared to be protrusion of the airway medially into the right mainstem bronchus due to his surgical resection, stump site.  Endobronchial brushings and biopsies were performed that were negative for malignancy at the site.  I did not see an overt endobronchial lesion.  He describes some pain and weakness in his LLE, feels like it "goes to sleep", no real pain.  He does not feel limited by his breathing. He has albuterol which he can use as needed, uses rarely lately, had been using 2x a day.  He is not on any maintenance bronchodilator regimen.   Review of Systems As per HPI   Past Medical History:  Diagnosis Date   AAA (abdominal aortic aneurysm)    followed by vasc surgery   Alcohol abuse    Barretts esophagus    bx distal esophagus 2011 neg    Bladder outlet obstruction 04/02/2018   CAD in native artery    a. s/p stent to prox RCA and mid Cx.   Cataract    Cholelithiasis    Chronic osteomyelitis involving ankle and foot, right ([place]) 07/13/2018   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   2006 - 3 adenomas largest 12 mm  2008 - no polyps 2013-  2 mm cecal polyp, adenoma, routine repeat colonoscopy 02/2017 approx 05/14/2017 18 polyps max 12 mm ALL adenomas recall 2019 [name], MD, [facility]      Dieulafoy lesion of duodenum     Diverticulosis of colon    DJD (degenerative joint disease)    left hip   Full dentures    upper and lower   GERD (gastroesophageal reflux disease)    diet controlled    Gout    feet   Hiatal hernia    2 cm noted on upper endos 2011    Hx of adenomatous colonic polyps    Hyperlipidemia    Hypertension    Internal hemorrhoid    Lung cancer ([place])    squamous cell (T2N0MX), 11/07- surgically treated, no chemo/XRT   Myocardial infarction ([place]) 2004   hx of, 2 stents 2004. pt was on Plavix x 3 years then transitioned to ASA, no problems since   Non-small cell carcinoma of right lung, stage 1 ([place]) 01/30/2016   Renal failure, acute ([facility])    hx of 2/2 medication, resolved   Tobacco abuse    smoker .5 ppd x 55 yrs   UTI (lower urinary tract infection)      Family History  Problem Relation Age of Onset   Diabetes Mother    Hypertension Sister    Heart disease Sister    Diabetes Sister    Arthritis Brother    Gout Brother    Colon cancer Neg Hx    Esophageal cancer Neg Hx    Rectal cancer Neg Hx  Stomach cancer Neg Hx    Colon polyps Neg Hx      Social History   Socioeconomic History   Marital status: Single    Spouse name: Not on file   Number of children: 0   Years of education: Not on file   Highest education level: Not on file  Occupational History   Not on file  Tobacco Use   Smoking status: Every Day    Packs/day: 0.50    Years: 55.00    Pack years: 27.50    Types: Cigarettes   Smokeless tobacco: Never   Tobacco comments:    .25 packs smoked daily [facility] 08/23/21  Vaping Use   Vaping Use: Never used  Substance and Sexual Activity   Alcohol use: Not Currently   Drug use: No   Sexual activity: Not on file  Other Topics Concern   Not on file  Social History Narrative   Not on file   Social Determinants of Health   Financial Resource Strain: Not on file  Food Insecurity: Not on file  Transportation Needs: Not on file  Physical Activity: Not on file   Stress: Not on file  Social Connections: Not on file  Intimate Partner Violence: Not on file    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 30 tablet 0   albuterol (VENTOLIN HFA) 108 (90 Base) MCG/ACT inhaler INHALE 1-2 PUFFS BY MOUTH EVERY 6 HOURS AS NEEDED FOR WHEEZE OR SHORTNESS OF BREATH (Patient taking differently: Inhale 1-2 puffs into the lungs every 6 (six) hours as needed for wheezing or shortness of breath.) 6.7 each 5   allopurinol (ZYLOPRIM) 100 MG tablet Take 100 mg by mouth daily.     amLODipine (NORVASC) 10 MG tablet Take 10 mg by mouth daily.     aspirin EC 81 MG EC tablet Take 1 tablet (81 mg total) by mouth daily. Swallow whole. 30 tablet 11   bisacodyl (DULCOLAX) 10 MG suppository Place 1 suppository (10 mg total) rectally daily as needed for moderate constipation or mild constipation. 12 suppository 0   escitalopram (LEXAPRO) 5 MG tablet Take 1 tablet (5 mg total) by mouth daily. 90 tablet 1   Ferrous Sulfate (IRON PO) Take 1 tablet by mouth daily.     HYDROcodone-acetaminophen (NORCO) 7.5-325 MG tablet Take 1 tablet by mouth 2 (two) times daily as needed for moderate pain. 60 tablet 0   metoprolol succinate (TOPROL-XL) 100 MG 24 hr tablet Take 100 mg by mouth daily. Take with or immediately following a meal.     mirtazapine (REMERON) 7.5 MG tablet Take 1 tablet (7.5 mg total) by mouth at bedtime.     Multiple Vitamin (MULTIVITAMIN WITH MINERALS) TABS tablet Take 1 tablet by mouth daily.      Pancrelipase, Lip-Prot-Amyl, (ZENPEP) 20000-63000 units CPEP Take 3 capsules by mouth 3 (three) times daily before meals. 180 capsule 3   rosuvastatin (CRESTOR) 20 MG tablet Take 1 tablet (20 mg  total) by mouth daily. 90 tablet 1   senna (SENOKOT) 8.6 MG TABS tablet Take 2 tablets (17.2 mg total) by mouth in the morning and at bedtime. (Patient taking differently: Take 1 tablet by mouth daily as needed for mild constipation.) 120 tablet 1   tamsulosin (FLOMAX) 0.4 MG CAPS capsule Take 2 capsules (0.8 mg total) by mouth every evening. 180 capsule 3   vitamin C (ASCORBIC ACID) 500 MG tablet Take 500 mg by mouth daily.     pantoprazole (PROTONIX) 40 MG tablet Take 1 tablet (40 mg total) by mouth daily. 90 tablet 1   No facility-administered medications prior to visit.       Objective:   Physical Exam Vitals:   08/23/21 1044  BP: 116/68  Pulse: 68  Temp: (!) 97.5 F (36.4 C)  TempSrc: Oral  SpO2: 98%  Weight: 120 lb 3.2 oz (54.5 kg)  Height: 6\' 1"  (1.854 m)   Gen: Pleasant, very thin, in no distress,  normal affect  ENT: No lesions,  mouth clear,  oropharynx clear, no postnasal drip  Neck: No JVD, no stridor  Lungs: No use of accessory muscles, distant, no crackles or wheezing on normal respiration, no wheeze on forced expiration  Cardiovascular: RRR, heart sounds normal, no murmur or gallops, no peripheral edema  Musculoskeletal: No deformities, no cyanosis or clubbing  Neuro: alert, awake, non focal  Skin: Warm, no lesions or rash      Assessment & Plan:   Abnormal CT of the chest On bronchoscopy there is an area of deviation of his right upper lobe stump into the proximal right mainstem bronchus.  I did not see an overt endobronchial lesion or any evidence of local recurrence.  His cytology and pathology were both negative.  We will plan to follow CT in 6 months to ensure interval stability.  If no change and determine the appropriate timing for follow-up, may be able to  transition over to lung cancer screening CTs.  Tobacco use disorder Discussed cessation with him today.  He has cut down.  He has albuterol to use as needed, minimal respiratory symptoms but I think he would benefit from pulmonary function testing.  We will arrange for next time.  If he has significant COPD then we will consider scheduled BD therapy.  As above he may benefit from a transition over to the lung cancer screening program  Baltazar Apo, MD, PhD 08/23/2021, 11:28 AM [facility] [phone] or if no answer before 7:00PM call [phone] For any issues after 7:00PM please call eLink [phone]

## 2021-08-23 NOTE — Assessment & Plan Note (Signed)
Discussed cessation with him today.  He has cut down.  He has albuterol to use as needed, minimal respiratory symptoms but I think he would benefit from pulmonary function testing.  We will arrange for next time.  If he has significant COPD then we will consider scheduled BD therapy.  As above he may benefit from a transition over to the lung cancer screening program ?

## 2021-08-23 NOTE — Assessment & Plan Note (Signed)
On bronchoscopy there is an area of deviation of his right upper lobe stump into the proximal right mainstem bronchus.  I did not see an overt endobronchial lesion or any evidence of local recurrence.  His cytology and pathology were both negative.  We will plan to follow CT in 6 months to ensure interval stability.  If no change and determine the appropriate timing for follow-up, may be able to transition over to lung cancer screening CTs. ?

## 2021-08-23 NOTE — Addendum Note (Signed)
Addended by: Gavin Potters R on: 08/23/2021 11:42 AM ? ? Modules accepted: Orders ? ?

## 2021-08-23 NOTE — Patient Instructions (Signed)
We will arrange for a repeat CT scan of the chest in August 2023. ?We will arrange for pulmonary function testing at next office visit. ?Keep your albuterol available to use 2 puffs when you needed for shortness of breath, chest tightness, wheezing. ?Try to work on decreasing your smoking.  Our ultimate goal will be to stop altogether. ?Follow Dr. Lamonte Sakai in August after your testing so we can review together. ?

## 2021-08-24 DIAGNOSIS — R338 Other retention of urine: Secondary | ICD-10-CM | POA: Diagnosis not present

## 2021-08-28 ENCOUNTER — Telehealth: Payer: Self-pay | Admitting: *Deleted

## 2021-08-28 NOTE — Telephone Encounter (Signed)
Spoke with Ms. [name] (his sister) regarding PCS request. [place] has not been able to get in contact with either Lalli nor his sister.  Gave Ms. [name] the phone number to contact [facility](per office request) [phone]. She is to contact office to set up appointment for in home nurse assessment. ?

## 2021-08-31 ENCOUNTER — Ambulatory Visit (INDEPENDENT_AMBULATORY_CARE_PROVIDER_SITE_OTHER): Payer: Medicare HMO | Admitting: Cardiology

## 2021-08-31 ENCOUNTER — Encounter: Payer: Self-pay | Admitting: Cardiology

## 2021-08-31 ENCOUNTER — Other Ambulatory Visit: Payer: Self-pay

## 2021-08-31 VITALS — BP 110/62 | HR 74 | Ht 73.0 in | Wt 124.0 lb

## 2021-08-31 DIAGNOSIS — I491 Atrial premature depolarization: Secondary | ICD-10-CM

## 2021-08-31 NOTE — Progress Notes (Signed)
? ?Electrophysiology Office Note ? ? ?Date:  08/31/2021  ? ?ID:  [name], DOB 25-May-1946, MRN [id number] ? ?PCP:  Axel Filler, MD  ?Cardiologist:  Nolberto Hanlon ?Primary Electrophysiologist:  Evalena Fujii Meredith Leeds, MD   ? ?Chief Complaint: AF ?  ?History of Present Illness: ?[name] is a 76 y.o. male who is being seen today for the evaluation of AF at the request of Axel Filler,*. Presenting today for electrophysiology evaluation. ? ?He has a history significant coronary artery disease status post RCA and circumflex stents in 2004, AAA with ectatic thoracic and iliac arteries followed by vascular surgery, CKD stage II, hypertension, prediabetes, colonic polyposis, chronic opioid abuse, gout, alcohol abuse, Barrett's esophagus, lung cancer status post lobectomy. ? ?He was admitted to the hospital 11/06/2020 with progressive weakness, poor appetite, weight loss, constipation.  Hospital course was notable for sepsis and Enterobacter cystitis.  He was found to have nonobstructive portal vein thrombosis, multiple aortic aneurysms, penetrating aortic ulcer.  He has severe hepatic steatosis concerning for underlying steatosis, cholelithiasis, acute hypoproliferative microcytic anemia. ? ?He was seen in cardiology clinic and wore 3-day monitor suggestive for atrial fibrillation.  Anticoagulation was deferred given his recent admission and overall poor health.  He recently had endoscopy and bronchoscopy with results pending. ? ?Today, denies symptoms of palpitations, chest pain, shortness of breath, orthopnea, PND, lower extremity edema, claudication, dizziness, presyncope, syncope, bleeding, or neurologic sequela. The patient is tolerating medications without difficulties.   ? ? ?Past Medical History:  ?Diagnosis Date  ? AAA (abdominal aortic aneurysm)   ? followed by vasc surgery  ? Alcohol abuse   ? Barretts esophagus   ? bx distal esophagus 2011 neg   ? Bladder outlet obstruction  04/02/2018  ? CAD in native artery   ? a. s/p stent to prox RCA and mid Cx.  ? Cataract   ? Cholelithiasis   ? Chronic osteomyelitis involving ankle and foot, right ([place]) 07/13/2018  ? 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  ? 2006 - 3 adenomas largest 12 mm  2008 - no polyps 2013-  2 mm cecal polyp, adenoma, routine repeat colonoscopy 02/2017 approx 05/14/2017 18 polyps max 12 mm ALL adenomas recall 2019 [name], MD, [facility]     ? Dieulafoy lesion of duodenum   ? Diverticulosis of colon   ? DJD (degenerative joint disease)   ? left hip  ? Full dentures   ? upper and lower  ? GERD (gastroesophageal reflux disease)   ? diet controlled   ? Gout   ? feet  ? Hiatal hernia   ? 2 cm noted on upper endos 2011   ? Hx of adenomatous colonic polyps   ? Hyperlipidemia   ? Hypertension   ? Internal hemorrhoid   ? Lung cancer ([place])   ? 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 Myocardial infarction [facility]) 2004  ? hx of, 2 stents 2004. pt was on Plavix x 3 years then transitioned to ASA, no problems since  ? Non-small cell carcinoma of right lung, stage 1 ([place]) 01/30/2016  ? Renal failure, acute ([place])   ? hx of 2/2 medication, resolved  ? Tobacco abuse   ? smoker .5 ppd x 55 yrs  ? UTI (lower urinary tract infection)   ? ?Past Surgical History:  ?Procedure Laterality Date  ? BIOPSY  11/12/2020  ? Procedure: BIOPSY;  Surgeon: Doran Stabler, MD;  Location: [place];  Service: Gastroenterology;;  ? BIOPSY  07/25/2021  ? Procedure: BIOPSY;  Surgeon: Daryel November, MD;  Location: [place];  Service: Gastroenterology;;  ? BRONCHIAL BIOPSY  07/27/2021  ? Procedure: BRONCHIAL BIOPSIES;  Surgeon: Collene Gobble, MD;  Location: [facility] ENDOSCOPY;  Service: Cardiopulmonary;;  ? BRONCHIAL BRUSHINGS  07/27/2021  ? Procedure: BRONCHIAL BRUSHINGS;  Surgeon: Collene Gobble, MD;  Location: [facility] ENDOSCOPY;  Service: Cardiopulmonary;;  ? cataract surgery Bilateral 2018  ? removal of cataracts  ? COLONOSCOPY     ? hx polyps - [name] 02/2012  ? COLONOSCOPY WITH PROPOFOL N/A 11/12/2020  ? Procedure: COLONOSCOPY WITH PROPOFOL;  Surgeon: Doran Stabler, MD;  Location: [place];  Service: Gastroenterology;  Laterality: N/A;  ? CORONARY ANGIOPLASTY WITH STENT PLACEMENT    ? ESOPHAGOGASTRODUODENOSCOPY (EGD) WITH PROPOFOL N/A 11/12/2020  ? Procedure: ESOPHAGOGASTRODUODENOSCOPY (EGD) WITH PROPOFOL;  Surgeon: Doran Stabler, MD;  Location: [place];  Service: Gastroenterology;  Laterality: N/A;  ? ESOPHAGOGASTRODUODENOSCOPY (EGD) WITH PROPOFOL N/A 03/06/2021  ? Procedure: ESOPHAGOGASTRODUODENOSCOPY (EGD) WITH PROPOFOL;  Surgeon: Yetta Flock, MD;  Location: [facility] ENDOSCOPY;  Service: Gastroenterology;  Laterality: N/A;  ? ESOPHAGOGASTRODUODENOSCOPY (EGD) WITH PROPOFOL N/A 07/25/2021  ? Procedure: ESOPHAGOGASTRODUODENOSCOPY (EGD) WITH PROPOFOL;  Surgeon: Daryel November, MD;  Location: [place];  Service: Gastroenterology;  Laterality: N/A;  ? flexible cystourethroscopy  05/24/06  ? HEMOSTASIS CLIP PLACEMENT  03/06/2021  ? Procedure: HEMOSTASIS CLIP PLACEMENT;  Surgeon: Yetta Flock, MD;  Location: [facility] ENDOS[facility];  Service: Gastroenterology;;  ? HOT HEMOSTASIS N/A 03/06/2021  ? Procedure: HOT HEMOSTASIS (ARGON PLASMA COAGULATION/BICAP);  Surgeon: Yetta Flock, MD;  Location: [facility] ENDOSCOPY;  Service: Gastroenterology;  Laterality: N/A;  ? INGUINAL HERNIA REPAIR  2005  ? LUNG LOBECTOMY Right 11/07  ? RUL  ? MULTIPLE TOOTH EXTRACTIONS    ? POLYPECTOMY  11/12/2020  ? Procedure: POLYPECTOMY;  Surgeon: Doran Stabler, MD;  Location: [place];  Service: Gastroenterology;;  ? TRANSURETHRAL RESECTION OF PROSTATE N/A 05/02/2021  ? Procedure: TRANSURETHRAL RESECTION OF THE PROSTATE (TURP);  Surgeon: Lucas Mallow, MD;  Location: [facility];  Service: Urology;  Laterality: N/A;  ? UPPER GASTROINTESTINAL ENDOSCOPY    ? VIDEO BRONCHOSCOPY  07/27/2021  ? Procedure: VIDEO BRONCHOSCOPY WITHOUT FLUORO;   Surgeon: Collene Gobble, MD;  Location: [facility] ENDOSCOPY;  Service: Cardiopulmonary;;  ? ? ? ?Current Outpatient Medications  ?Medication Sig Dispense Refill  ? acetaminophen (TYLENOL 8 HOUR) 650 MG CR tablet Take 1 tablet (650 mg total) by mouth every 8 (eight) hours as needed for pain or fever. 30 tablet 0  ? albuterol (VENTOLIN HFA) 108 (90 Base) MCG/ACT inhaler INHALE 1-2 PUFFS BY MOUTH EVERY 6 HOURS AS NEEDED FOR WHEEZE OR SHORTNESS OF BREATH (Patient taking differently: Inhale 1-2 puffs into the lungs every 6 (six) hours as needed for wheezing or shortness of breath.) 6.7 each 5  ? allopurinol (ZYLOPRIM) 100 MG tablet Take 100 mg by mouth daily.    ? aspirin EC 81 MG EC tablet Take 1 tablet (81 mg total) by mouth daily. Swallow whole. 30 tablet 11  ? bisacodyl (DULCOLAX) 10 MG suppository Place 1 suppository (10 mg total) rectally daily as needed for moderate constipation or mild constipation. 12 suppository 0  ? Ferrous Sulfate (IRON PO) Take 1 tablet by mouth daily.    ? HYDROcodone-acetaminophen (NORCO) 7.5-325 MG tablet Take 1 tablet by mouth 2 (two) times daily as needed for moderate pain. 60 tablet 0  ?  metoprolol succinate (TOPROL-XL) 100 MG 24 hr tablet Take 100 mg by mouth daily. Take with or immediately following a meal.    ? Multiple Vitamin (MULTIVITAMIN WITH MINERALS) TABS tablet Take 1 tablet by mouth daily.    ? Pancrelipase, Lip-Prot-Amyl, (ZENPEP) 20000-63000 units CPEP Take 3 capsules by mouth 3 (three) times daily before meals. 180 capsule 3  ? rosuvastatin (CRESTOR) 20 MG tablet Take 1 tablet (20 mg total) by mouth daily. 90 tablet 1  ? senna (SENOKOT) 8.6 MG TABS tablet Take 2 tablets (17.2 mg total) by mouth in the morning and at bedtime. (Patient taking differently: Take 1 tablet by mouth daily as needed for mild constipation.) 120 tablet 1  ? tamsulosin (FLOMAX) 0.4 MG CAPS capsule Take 2 capsules (0.8 mg total) by mouth every evening. 180 capsule 3  ? vitamin C (ASCORBIC ACID) 500 MG  tablet Take 500 mg by mouth daily.    ? amLODipine (NORVASC) 10 MG tablet Take 10 mg by mouth daily.    ? escitalopram (LEXAPRO) 5 MG tablet Take 1 tablet (5 mg total) by mouth daily. 90 tablet 1  ? mirtazapine (REM

## 2021-09-04 ENCOUNTER — Other Ambulatory Visit (INDEPENDENT_AMBULATORY_CARE_PROVIDER_SITE_OTHER): Payer: Medicare HMO

## 2021-09-04 ENCOUNTER — Encounter: Payer: Self-pay | Admitting: Internal Medicine

## 2021-09-04 ENCOUNTER — Ambulatory Visit (INDEPENDENT_AMBULATORY_CARE_PROVIDER_SITE_OTHER): Payer: Medicare HMO | Admitting: Internal Medicine

## 2021-09-04 VITALS — BP 90/50 | HR 60 | Ht 73.0 in | Wt 123.0 lb

## 2021-09-04 DIAGNOSIS — K635 Polyp of colon: Secondary | ICD-10-CM

## 2021-09-04 DIAGNOSIS — D135 Benign neoplasm of extrahepatic bile ducts: Secondary | ICD-10-CM | POA: Insufficient documentation

## 2021-09-04 DIAGNOSIS — F1011 Alcohol abuse, in remission: Secondary | ICD-10-CM | POA: Diagnosis not present

## 2021-09-04 DIAGNOSIS — D61818 Other pancytopenia: Secondary | ICD-10-CM

## 2021-09-04 DIAGNOSIS — Z79891 Long term (current) use of opiate analgesic: Secondary | ICD-10-CM

## 2021-09-04 DIAGNOSIS — M25552 Pain in left hip: Secondary | ICD-10-CM

## 2021-09-04 LAB — COMPREHENSIVE METABOLIC PANEL
ALT: 58 U/L — ABNORMAL HIGH (ref 0–53)
AST: 71 U/L — ABNORMAL HIGH (ref 0–37)
Albumin: 3.4 g/dL — ABNORMAL LOW (ref 3.5–5.2)
Alkaline Phosphatase: 83 U/L (ref 39–117)
BUN: 12 mg/dL (ref 6–23)
CO2: 25 mEq/L (ref 19–32)
Calcium: 9.1 mg/dL (ref 8.4–10.5)
Chloride: 109 mEq/L (ref 96–112)
Creatinine, Ser: 1.04 mg/dL (ref 0.40–1.50)
GFR: 70.17 mL/min (ref >60.00)
Glucose, Bld: 90 mg/dL (ref 70–99)
Potassium: 3.1 mEq/L — ABNORMAL LOW (ref 3.5–5.1)
Sodium: 143 mEq/L (ref 135–145)
Total Bilirubin: 0.4 mg/dL (ref 0.2–1.2)
Total Protein: 6.7 g/dL (ref 6.0–8.3)

## 2021-09-04 LAB — CBC WITH DIFFERENTIAL/PLATELET
Basophils Absolute: 0.1 10*3/uL (ref 0.0–0.1)
Basophils Relative: 1.6 % (ref 0.0–3.0)
Eosinophils Absolute: 0.1 10*3/uL (ref 0.0–0.7)
Eosinophils Relative: 0.9 % (ref 0.0–5.0)
HCT: 32.7 % — ABNORMAL LOW (ref 39.0–52.0)
Hemoglobin: 10.4 g/dL — ABNORMAL LOW (ref 13.0–17.0)
Lymphocytes Relative: 23.1 % (ref 12.0–46.0)
Lymphs Abs: 1.2 10*3/uL (ref 0.7–4.0)
MCHC: 31.9 g/dL (ref 30.0–36.0)
MCV: 88 fl (ref 78.0–100.0)
Monocytes Absolute: 0.3 10*3/uL (ref 0.1–1.0)
Monocytes Relative: 5.5 % (ref 3.0–12.0)
Neutro Abs: 3.7 10*3/uL (ref 1.4–7.7)
Neutrophils Relative %: 68.9 % (ref 43.0–77.0)
Platelets: 196 10*3/uL (ref 150.0–400.0)
RBC: 3.71 Mil/uL — ABNORMAL LOW (ref 4.22–5.81)
RDW: 21.5 % — ABNORMAL HIGH (ref 11.5–15.5)
WBC: 5.3 10*3/uL (ref 4.0–10.5)

## 2021-09-04 LAB — AMYLASE: Amylase: 123 U/L (ref 27–131)

## 2021-09-04 LAB — FERRITIN: Ferritin: 99.1 ng/mL (ref 22.0–322.0)

## 2021-09-04 LAB — LIPASE: Lipase: 24 U/L (ref 11.0–59.0)

## 2021-09-04 NOTE — Progress Notes (Signed)
? ?[name] 76 y.o. 1945-10-14 [id number] ? ?Assessment & Plan:  ? ?Encounter Diagnoses  ?Name Primary?  ? Adenoma of ampulla of Vater Yes  ? Colon polyposis - attenuated   ? Other pancytopenia ([place])   ? Alcohol abuse, in remission   ? ? ?I will discuss with Dr. Rush Landmark regarding further evaluation for possible removal of this adenoma by ampullectomy.  He would probably need an EUS.  His comorbidities do have impact though he is improving.  I do not think the tiny pancreatic cysts are a significant clinical issue perhaps some chronic pancreatitis changes? ? ?Orders Placed This Encounter  ?Procedures  ? CBC with Differential/Platelet  ? Comprehensive metabolic panel  ? Lipase  ? Amylase  ? Ferritin  ? ?I am not sure he needs pancreatic enzyme supplements they are not too costly he says and he is taking them.  We can revisit this.  He was losing weight though I am not convinced he was ever having stearrhea.  There is still much going on I thought that continuing was reasonable at this point and we can revisit and consider testing his fecal elastase plus or minus fecal fat off enzymes at an appropriate time, perhaps. ? ? ?CC: Axel Filler, MD ? ?Subjective:  ? ?Chief Complaint: Follow-up of adenoma of ampulla ? ?HPI ?Patient is a 76 year old African-American man who has been hospitalized multiple times in the last year, history of alcohol abuse, was losing weight not eating well and had an EGD and a colonoscopy and imaging studies that did not show gastrointestinal malignancy but he did have an ampullary adenoma detected in an EGD by Dr. Candis Schatz as below. ? ? ?Wt Readings from Last 3 Encounters:  ?09/04/21 123 lb (55.8 kg)  ?08/31/21 124 lb (56.2 kg)  ?08/23/21 120 lb 3.2 oz (54.5 kg)  ? ? ?EGD 08/04/2021 ?- The examined portions of the nasopharynx, oropharynx and larynx were normal. ?- Normal esophagus. ?- A single lesion diagnostic of aberrant pancreas was found in the stomach. ?- Bilious  gastric fluid. ?- Nodule found in the duodenum. Biopsied. 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 Biopsied. ? ? ?SURGICAL PATHOLOGY  ?CASE: [id number]  ?PATIENT: [name]  ?Surgical Pathology Report  ? ?Clinical History: biopsy of ampullary lesion found on EGD on Sept 2022  ?(cm)  ? ? ?FINAL MICROSCOPIC DIAGNOSIS:  ? ?A. DUODENUM, NODULE, BIOPSY:  ?-  Superficial fragments of benign duodenal mucosa  ?-  No acute inflammation, villous blunting or increased intraepithelial  ?lymphocytes identified  ? ?B. AMPULLARY MASS, BIOPSY:  ?-  Ampullary adenoma  ?-  No high-grade dysplasia or malignancy identified ? ? ? ?MRI MRCP 07/24/2021 ?IMPRESSION: ?1. Study is significantly limited by motion artifact. The MRCP ?images are nondiagnostic, and there is limited assessment of the ?pancreas. ?2. Cholelithiasis without evidence of cholecystitis or biliary ?dilatation. ?3. Tiny T2 hyperintense lesions within the pancreatic head are ?similar to previous MRI, but suboptimally evaluated due to motion. ?If determine to require additional follow-up clinically, this would ?be best performed with CT in this patient due to the limitations of ?MRI. ?4. Slowly enlarging abdominal aortic aneurysm as evaluated on CTA ?last month. Vascular surgery consultation recommended due to ?increased risk of rupture for AAA >5.5 cm. This recommendation ?follows ACR consensus. This recommendation follows ACR consensus ?guidelines: White Paper of the ACR Incidental Findings Committee II ?on Vascular Findings. J Am Coll Radiol 2013; 10:789-794. ?Allergies  ?Allergen Reactions  ? Candesartan  Cilexetil-Hctz Other (See Comments)  ?  Acute renal failure  ? Hydrochlorothiazide Other (See Comments)  ?  Acute renal failure  ? Shellfish Allergy Other (See Comments)  ?  Gout occurs  ? ?Current Meds  ?Medication Sig  ? acetaminophen (TYLENOL 8 HOUR) 650 MG CR tablet Take 1 tablet (650 mg total) by mouth every 8 (eight) hours as  needed for pain or fever.  ? albuterol (VENTOLIN HFA) 108 (90 Base) MCG/ACT inhaler INHALE 1-2 PUFFS BY MOUTH EVERY 6 HOURS AS NEEDED FOR WHEEZE OR SHORTNESS OF BREATH (Patient taking differently: Inhale 1-2 puffs into the lungs every 6 (six) hours as needed for wheezing or shortness of breath.)  ? allopurinol (ZYLOPRIM) 100 MG tablet Take 100 mg by mouth daily.  ? amLODipine (NORVASC) 10 MG tablet Take 10 mg by mouth daily.  ? bisacodyl (DULCOLAX) 10 MG suppository Place 1 suppository (10 mg total) rectally daily as needed for moderate constipation or mild constipation.  ? escitalopram (LEXAPRO) 5 MG tablet Take 1 tablet (5 mg total) by mouth daily.  ? Ferrous Sulfate (IRON PO) Take 1 tablet by mouth daily.  ? HYDROcodone-acetaminophen (NORCO) 7.5-325 MG tablet Take 1 tablet by mouth 2 (two) times daily as needed for moderate pain.  ? metoprolol succinate (TOPROL-XL) 100 MG 24 hr tablet Take 100 mg by mouth daily. Take with or immediately following a meal.  ? mirtazapine (REMERON) 7.5 MG tablet Take 1 tablet (7.5 mg total) by mouth at bedtime.  ? Pancrelipase, Lip-Prot-Amyl, (ZENPEP) 20000-63000 units CPEP Take 3 capsules by mouth 3 (three) times daily before meals.  ? rosuvastatin (CRESTOR) 20 MG tablet Take 1 tablet (20 mg total) by mouth daily.  ? senna (SENOKOT) 8.6 MG TABS tablet Take 2 tablets (17.2 mg total) by mouth in the morning and at bedtime. (Patient taking differently: Take 1 tablet by mouth daily as needed for mild constipation.)  ? tamsulosin (FLOMAX) 0.4 MG CAPS capsule Take 2 capsules (0.8 mg total) by mouth every evening.  ? vitamin C (ASCORBIC ACID) 500 MG tablet Take 500 mg by mouth daily.  ? ?Past Medical History:  ?Diagnosis Date  ? AAA (abdominal aortic aneurysm)   ? followed by vasc surgery  ? Alcohol abuse   ? Barretts esophagus   ? bx distal esophagus 2011 neg   ? Bladder outlet obstruction 04/02/2018  ? CAD in native artery   ? a. s/p stent to prox RCA and mid Cx.  ? Cataract   ?  Cholelithiasis   ? Chronic osteomyelitis involving ankle and foot, right ([place]) 07/13/2018  ? 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  ? 2006 - 3 adenomas largest 12 mm  2008 - no polyps 2013-  2 mm cecal polyp, adenoma, routine repeat colonoscopy 02/2017 approx 05/14/2017 18 polyps max 12 mm ALL adenomas recall 2019 [name], MD, [facility]     ? Dieulafoy lesion of duodenum   ? Diverticulosis of colon   ? DJD (degenerative joint disease)   ? left hip  ? Full dentures   ? upper and lower  ? GERD (gastroesophageal reflux disease)   ? diet controlled   ? Gout   ? feet  ? Hiatal hernia   ? 2 cm noted on upper endos 2011   ? Hx of adenomatous colonic polyps   ? Hyperlipidemia   ? Hypertension   ? Internal hemorrhoid   ? Lung cancer ([place])   ? 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  Myocardial infarction [facility]) 2004  ? hx of, 2 stents 2004. pt was on Plavix x 3 years then transitioned to ASA, no problems since  ? Non-small cell carcinoma of right lung, stage 1 ([place]) 01/30/2016  ? Renal failure, acute ([place])   ? hx of 2/2 medication, resolved  ? Tobacco abuse   ? smoker .5 ppd x 55 yrs  ? UTI (lower urinary tract infection)   ? ?Past Surgical History:  ?Procedure Laterality Date  ? BIOPSY  11/12/2020  ? Procedure: BIOPSY;  Surgeon: Doran Stabler, MD;  Location: [place];  Service: Gastroenterology;;  ? BIOPSY  07/25/2021  ? Procedure: BIOPSY;  Surgeon: Daryel November, MD;  Location: [place];  Service: Gastroenterology;;  ? BRONCHIAL BIOPSY  07/27/2021  ? Procedure: BRONCHIAL BIOPSIES;  Surgeon: Collene Gobble, MD;  Location[facility] ENDOSCOPY;  Service: Cardiopulmonary;;  ? BRONCHIAL BRUSHINGS  07/27/2021  ? Procedure: BRONCHIAL BRUSHINGS;  Surgeon: Collene Gobble, MD;  Location: [facility] ENDOSCOPY;  Service: Cardiopulmonary;;  ? cataract surgery Bilateral 2018  ? removal of cataracts  ? COLONOSCOPY    ? hx polyps - [name] 02/2012  ? COLONOSCOPY WITH PROPOFOL N/A 11/12/2020  ? Procedure:  COLONOSCOPY WITH PROPOFOL;  Surgeon: Doran Stabler, MD;  Location: [place];  Service: Gastroenterology;  Laterality: N/A;  ? CORONARY ANGIOPLASTY WITH STENT PLACEMENT    ? ESOPHAGOGASTRODUODENOSCOPY (EGD) W

## 2021-09-04 NOTE — Patient Instructions (Signed)

## 2021-09-05 ENCOUNTER — Telehealth: Payer: Self-pay | Admitting: Internal Medicine

## 2021-09-05 DIAGNOSIS — D135 Benign neoplasm of extrahepatic bile ducts: Secondary | ICD-10-CM

## 2021-09-05 DIAGNOSIS — R7989 Other specified abnormal findings of blood chemistry: Secondary | ICD-10-CM

## 2021-09-05 NOTE — Telephone Encounter (Signed)
Tell [name] to come next week and repeat liver tests dx is abnormal LFT's ? ?Also ok to set up a next available office visit w/ Dr. Rush Landmark dx ampullary adenoma - explain this is when he will hear about possible procedure to remove tumor on ampulla ?

## 2021-09-06 ENCOUNTER — Other Ambulatory Visit: Payer: Self-pay

## 2021-09-06 DIAGNOSIS — R7989 Other specified abnormal findings of blood chemistry: Secondary | ICD-10-CM

## 2021-09-06 MED ORDER — POTASSIUM CHLORIDE CRYS ER 20 MEQ PO TBCR: 20.0000 meq | EXTENDED_RELEASE_TABLET | Freq: Every day | ORAL | 0 refills | Status: DC

## 2021-09-06 MED ORDER — HYDROCODONE-ACETAMINOPHEN 7.5-325 MG PO TABS: 1.0000 | ORAL_TABLET | Freq: Two times a day (BID) | ORAL | 0 refills | Status: DC | PRN

## 2021-09-06 NOTE — Telephone Encounter (Signed)
Pt was notified of Dr. [name] recommendations: ?Orders for labs placed in Epic: Pt made aware to come to our lab located in the basement next week: ?Appointment Scheduled for Dr. Rush Landmark on 10/12/2021 at 8:50: Pt made aware ?Pt verbalized understanding with all questions answered.  ? ? ?

## 2021-09-06 NOTE — Progress Notes (Signed)
I spoke with the patient. Sounds like his nutrition has been doing a lot better since I last saw him in January. The hypokalemia is currently asymptomatic. I sent in a small potassium supplement to use for 10 days and asked him to follow up with me in my clinic in a few weeks. I also refilled his pain medication. Thank you for looping me in. ?

## 2021-09-10 ENCOUNTER — Telehealth: Payer: Self-pay | Admitting: Student in an Organized Health Care Education/Training Program

## 2021-09-10 NOTE — Telephone Encounter (Signed)
Refill Request ? ? ?HYDROcodone-acetaminophen (NORCO) 7.5-325 MG tablet ? ? ?[id number] [place] [place] [facility] [place] (Ph: [phone]) ?

## 2021-09-10 NOTE — Telephone Encounter (Signed)
[facility]. [facility] notified. [facility] not [facility] in [facility]. He will call other pharmacies to see which one has this in stock. ?

## 2021-09-12 ENCOUNTER — Other Ambulatory Visit: Payer: Self-pay

## 2021-09-12 ENCOUNTER — Other Ambulatory Visit (INDEPENDENT_AMBULATORY_CARE_PROVIDER_SITE_OTHER): Payer: Medicare HMO

## 2021-09-12 DIAGNOSIS — R7989 Other specified abnormal findings of blood chemistry: Secondary | ICD-10-CM

## 2021-09-12 LAB — COMPREHENSIVE METABOLIC PANEL
ALT: 35 U/L (ref 0–53)
AST: 43 U/L — ABNORMAL HIGH (ref 0–37)
Albumin: 3.4 g/dL — ABNORMAL LOW (ref 3.5–5.2)
Alkaline Phosphatase: 74 U/L (ref 39–117)
BUN: 14 mg/dL (ref 6–23)
CO2: 24 mEq/L (ref 19–32)
Calcium: 9.1 mg/dL (ref 8.4–10.5)
Chloride: 109 mEq/L (ref 96–112)
Creatinine, Ser: 1 mg/dL (ref 0.40–1.50)
GFR: 73.54 mL/min (ref >60.00)
Glucose, Bld: 87 mg/dL (ref 70–99)
Potassium: 3.3 mEq/L — ABNORMAL LOW (ref 3.5–5.1)
Sodium: 141 mEq/L (ref 135–145)
Total Bilirubin: 0.5 mg/dL (ref 0.2–1.2)
Total Protein: 6.7 g/dL (ref 6.0–8.3)

## 2021-09-12 LAB — HEPATIC FUNCTION PANEL
ALT: 35 U/L (ref 0–53)
AST: 43 U/L — ABNORMAL HIGH (ref 0–37)
Albumin: 3.4 g/dL — ABNORMAL LOW (ref 3.5–5.2)
Alkaline Phosphatase: 74 U/L (ref 39–117)
Bilirubin, Direct: 0.1 mg/dL (ref 0.0–0.3)
Total Bilirubin: 0.5 mg/dL (ref 0.2–1.2)
Total Protein: 6.7 g/dL (ref 6.0–8.3)

## 2021-09-12 MED ORDER — MIRTAZAPINE 7.5 MG PO TABS: 7.5000 mg | ORAL_TABLET | Freq: Every day | ORAL | 1 refills | Status: DC

## 2021-09-12 NOTE — Telephone Encounter (Signed)
mirtazapine (REMERON)  ?[id number] [place], [place] - [address] AT [place] ?

## 2021-09-13 ENCOUNTER — Other Ambulatory Visit: Payer: Self-pay

## 2021-09-13 DIAGNOSIS — R7989 Other specified abnormal findings of blood chemistry: Secondary | ICD-10-CM

## 2021-09-13 MED ORDER — MIRTAZAPINE 7.5 MG PO TABS: 7.5000 mg | ORAL_TABLET | Freq: Every day | ORAL | 1 refills | Status: DC

## 2021-09-13 NOTE — Addendum Note (Signed)
Addended by: Velora Heckler on: 09/13/2021 09:11 AM ? ? Modules accepted: Orders ? ?

## 2021-09-14 ENCOUNTER — Other Ambulatory Visit: Payer: Self-pay | Admitting: Student in an Organized Health Care Education/Training Program

## 2021-09-14 ENCOUNTER — Other Ambulatory Visit: Payer: Self-pay | Admitting: *Deleted

## 2021-09-14 DIAGNOSIS — M25551 Pain in right hip: Secondary | ICD-10-CM

## 2021-09-14 DIAGNOSIS — Z79891 Long term (current) use of opiate analgesic: Secondary | ICD-10-CM

## 2021-09-14 MED ORDER — HYDROCODONE-ACETAMINOPHEN 7.5-325 MG PO TABS: 1.0000 | ORAL_TABLET | Freq: Two times a day (BID) | ORAL | 0 refills | Status: DC | PRN

## 2021-09-14 NOTE — Telephone Encounter (Signed)
[facility] [address] [facility] [place]  Call to [facility] on [address] they do have and will need a prescription sent to that pharmacy. ?

## 2021-09-14 NOTE — Telephone Encounter (Signed)
Please call the patient about a medication refill. ?

## 2021-09-17 DIAGNOSIS — R338 Other retention of urine: Secondary | ICD-10-CM | POA: Diagnosis not present

## 2021-09-25 ENCOUNTER — Telehealth: Payer: Self-pay | Admitting: *Deleted

## 2021-09-25 NOTE — Telephone Encounter (Signed)
Spoke with [facility] [phone]) regarding  the status of the [facility] referral faxed on 07-17-21.  Per office, [name] had a Nurse Assessment done on September 12, 2021. ?

## 2021-10-01 ENCOUNTER — Other Ambulatory Visit (INDEPENDENT_AMBULATORY_CARE_PROVIDER_SITE_OTHER): Payer: Medicare HMO

## 2021-10-01 DIAGNOSIS — R7989 Other specified abnormal findings of blood chemistry: Secondary | ICD-10-CM

## 2021-10-01 LAB — HEPATIC FUNCTION PANEL
ALT: 63 U/L — ABNORMAL HIGH (ref 0–53)
AST: 85 U/L — ABNORMAL HIGH (ref 0–37)
Albumin: 3.6 g/dL (ref 3.5–5.2)
Alkaline Phosphatase: 102 U/L (ref 39–117)
Bilirubin, Direct: 0.1 mg/dL (ref 0.0–0.3)
Total Bilirubin: 0.5 mg/dL (ref 0.2–1.2)
Total Protein: 7 g/dL (ref 6.0–8.3)

## 2021-10-04 DIAGNOSIS — R338 Other retention of urine: Secondary | ICD-10-CM | POA: Diagnosis not present

## 2021-10-04 DIAGNOSIS — N312 Flaccid neuropathic bladder, not elsewhere classified: Secondary | ICD-10-CM | POA: Diagnosis not present

## 2021-10-04 DIAGNOSIS — C61 Malignant neoplasm of prostate: Secondary | ICD-10-CM | POA: Diagnosis not present

## 2021-10-05 ENCOUNTER — Other Ambulatory Visit: Payer: Self-pay | Admitting: Urology

## 2021-10-05 DIAGNOSIS — C61 Malignant neoplasm of prostate: Secondary | ICD-10-CM

## 2021-10-08 ENCOUNTER — Telehealth: Payer: Self-pay

## 2021-10-08 ENCOUNTER — Other Ambulatory Visit (HOSPITAL_COMMUNITY): Payer: Self-pay | Admitting: Urology

## 2021-10-08 DIAGNOSIS — C61 Malignant neoplasm of prostate: Secondary | ICD-10-CM

## 2021-10-08 MED ORDER — ZENPEP 20000-63000 UNITS PO CPEP: 3.0000 | ORAL_CAPSULE | Freq: Three times a day (TID) | ORAL | 3 refills | Status: DC

## 2021-10-08 NOTE — Telephone Encounter (Signed)
Refilled Zenpep. ?

## 2021-10-08 NOTE — Telephone Encounter (Signed)
Pancrelipase, Lip-Prot-Amyl, (ZENPEP) 20000-63000 units CPEP [[id number]]  ?[id number] [place], [place] - [address] AT [place]  ?[place], [place] [address]  ?Phone:  [phone]  Fax:  [phone]  ?

## 2021-10-12 ENCOUNTER — Ambulatory Visit (INDEPENDENT_AMBULATORY_CARE_PROVIDER_SITE_OTHER): Payer: Medicare HMO | Admitting: Gastroenterology

## 2021-10-12 ENCOUNTER — Other Ambulatory Visit (INDEPENDENT_AMBULATORY_CARE_PROVIDER_SITE_OTHER): Payer: Medicare HMO

## 2021-10-12 ENCOUNTER — Encounter: Payer: Self-pay | Admitting: Gastroenterology

## 2021-10-12 VITALS — BP 96/58 | HR 60 | Ht 70.0 in | Wt 124.5 lb

## 2021-10-12 DIAGNOSIS — C241 Malignant neoplasm of ampulla of Vater: Secondary | ICD-10-CM

## 2021-10-12 DIAGNOSIS — R7989 Other specified abnormal findings of blood chemistry: Secondary | ICD-10-CM

## 2021-10-12 DIAGNOSIS — D135 Benign neoplasm of extrahepatic bile ducts: Secondary | ICD-10-CM

## 2021-10-12 DIAGNOSIS — R198 Other specified symptoms and signs involving the digestive system and abdomen: Secondary | ICD-10-CM

## 2021-10-12 LAB — COMPREHENSIVE METABOLIC PANEL
ALT: 40 U/L (ref 0–53)
AST: 49 U/L — ABNORMAL HIGH (ref 0–37)
Albumin: 3.7 g/dL (ref 3.5–5.2)
Alkaline Phosphatase: 69 U/L (ref 39–117)
BUN: 17 mg/dL (ref 6–23)
CO2: 27 mEq/L (ref 19–32)
Calcium: 9.3 mg/dL (ref 8.4–10.5)
Chloride: 106 mEq/L (ref 96–112)
Creatinine, Ser: 1.04 mg/dL (ref 0.40–1.50)
GFR: 70.12 mL/min (ref >60.00)
Glucose, Bld: 93 mg/dL (ref 70–99)
Potassium: 3.7 mEq/L (ref 3.5–5.1)
Sodium: 140 mEq/L (ref 135–145)
Total Bilirubin: 0.6 mg/dL (ref 0.2–1.2)
Total Protein: 7.1 g/dL (ref 6.0–8.3)

## 2021-10-12 LAB — CBC
HCT: 34.1 % — ABNORMAL LOW (ref 39.0–52.0)
Hemoglobin: 10.9 g/dL — ABNORMAL LOW (ref 13.0–17.0)
MCHC: 32.1 g/dL (ref 30.0–36.0)
MCV: 92.2 fl (ref 78.0–100.0)
Platelets: 152 10*3/uL (ref 150.0–400.0)
RBC: 3.69 Mil/uL — ABNORMAL LOW (ref 4.22–5.81)
RDW: 20.1 % — ABNORMAL HIGH (ref 11.5–15.5)
WBC: 5.4 10*3/uL (ref 4.0–10.5)

## 2021-10-12 LAB — PROTIME-INR
INR: 1 ratio (ref 0.8–1.0)
Prothrombin Time: 11.2 s (ref 9.6–13.1)

## 2021-10-12 NOTE — Patient Instructions (Signed)
Your provider has requested that you go to the basement level for lab work before leaving today. Press "B" on the elevator. The lab is located at the first door on the left as you exit the elevator. ? ?You have been scheduled for an endoscopy. Please follow written instructions given to you at your visit today. ?If you use inhalers (even only as needed), please bring them with you on the day of your procedure. ? ?If you are age 76 or older, your body mass index should be between 23-30. Your Body mass index is 17.86 kg/m?Marland Kitchen If this is out of the aforementioned range listed, please consider follow up with your Primary Care Provider. ? ?The [facility] providers would like to encourage you to use [name] to communicate with providers for non-urgent requests or questions.  Due to long hold times on the telephone, sending your provider a message by [facility] may be a faster and more efficient way to get a response.  Please allow 48 business hours for a response.  Please remember that this is for non-urgent requests.  ?_______________________________________________________ ? ?Thank you for choosing me and [facility]. ? ?Dr. Rush Landmark ? ? ? ?

## 2021-10-12 NOTE — Progress Notes (Signed)
? ?GASTROENTEROLOGY OUTPATIENT CLINIC VISIT  ? ?Primary Care Provider ?Axel Filler, MD ?[place] [address] ?[phone] ? ?Referring Provider ?Dr. [name] ? ?Patient Profile: ?[name] is a 76 y.o. male with a pmh significant for CAD, hypertension, hyperlipidemia, AAA, prior lung cancer, GERD, hiatal hernia, colon polyps, duodenal ampullary adenoma (in situ).  The patient presents to the [facility] for an evaluation and management of problem(s) noted below: ? ?Problem List ?1. Adenoma of ampulla of Vater   ?2. Ampullary adenoma   ?3. Abnormal findings on esophagogastroduodenoscopy (EGD)   ?4. Abnormal LFTs   ? ? ?History of Present Illness ?Please see prior GI notes for full details of HPI. ? ?Interval History ?As per the prior GI notes, patient was recently hospitalized and found to have adenomatous tissue in the region of the ampulla.  Subsequently followed up with his primary gastroenterologist and was referred for [place] today to evaluate him for consideration of endoscopic ampullectomy attempt.  Patient is unaccompanied today.  Other than having more frequent bowel movements and diarrhea, he is not experiencing any other significant issues per his report at this time.  He feels that he is getting stronger but knows that he is not the healthiest patients. ? ?GI Review of Systems ?Positive as above ?Negative for dysphagia, odynophagia, nausea, vomiting, melena, hematochezia ? ?Review of Systems ?General: Denies fevers/chills/weight loss unintentionally ?Cardiovascular: Denies chest pain ?Pulmonary: Denies shortness of breath ?Gastroenterological: See HPI ?Genitourinary: Denies darkened urine ?Hematological: Positive for history of easy bruising/bleeding ?Dermatological: Denies jaundice ?Psychological: Mood is stable ? ? ?Medications ?Current Outpatient Medications  ?Medication Sig Dispense Refill  ? acetaminophen (TYLENOL 8 HOUR) 650 MG CR tablet Take  1 tablet (650 mg total) by mouth every 8 (eight) hours as needed for pain or fever. 30 tablet 0  ? albuterol (VENTOLIN HFA) 108 (90 Base) MCG/ACT inhaler INHALE 1-2 PUFFS BY MOUTH EVERY 6 HOURS AS NEEDED FOR WHEEZE OR SHORTNESS OF BREATH (Patient taking differently: Inhale 1-2 puffs into the lungs every 6 (six) hours as needed for wheezing or shortness of breath.) 6.7 each 5  ? allopurinol (ZYLOPRIM) 100 MG tablet Take 100 mg by mouth daily.    ? amLODipine (NORVASC) 10 MG tablet Take 10 mg by mouth daily.    ? escitalopram (LEXAPRO) 5 MG tablet Take 1 tablet (5 mg total) by mouth daily. 90 tablet 1  ? Ferrous Sulfate (IRON PO) Take 1 tablet by mouth daily.    ? HYDROcodone-acetaminophen (NORCO) 7.5-325 MG tablet Take 1 tablet by mouth 2 (two) times daily as needed for moderate pain. 60 tablet 0  ? metoprolol succinate (TOPROL-XL) 100 MG 24 hr tablet Take 100 mg by mouth daily. Take with or immediately following a meal.    ? mirtazapine (REMERON) 7.5 MG tablet Take 1 tablet (7.5 mg total) by mouth at bedtime. 90 tablet 1  ? Multiple Vitamin (MULTIVITAMIN WITH MINERALS) TABS tablet Take 1 tablet by mouth daily.    ? Pancrelipase, Lip-Prot-Amyl, (ZENPEP) 20000-63000 units CPEP Take 3 capsules by mouth 3 (three) times daily before meals. 180 capsule 3  ? rosuvastatin (CRESTOR) 20 MG tablet TAKE 1 TABLET EVERY DAY 90 tablet 1  ? senna (SENOKOT) 8.6 MG TABS tablet Take 2 tablets (17.2 mg total) by mouth in the morning and at bedtime. (Patient taking differently: Take 1 tablet by mouth 2 (two) times daily.) 120 tablet 1  ? tamsulosin (FLOMAX) 0.4 MG CAPS capsule Take 2  capsules (0.8 mg total) by mouth every evening. 180 capsule 3  ? vitamin C (ASCORBIC ACID) 500 MG tablet Take 500 mg by mouth daily.    ? pantoprazole (PROTONIX) 40 MG tablet Take 1 tablet (40 mg total) by mouth daily. 90 tablet 1  ? potassium chloride SA (KLOR-CON M) 20 MEQ tablet Take 1 tablet (20 mEq total) by mouth daily for 10 days. 10 tablet 0  ? ?No  current facility-administered medications for this visit.  ? ? ?Allergies ?Allergies  ?Allergen Reactions  ? Candesartan Cilexetil-Hctz Other (See Comments)  ?  Acute renal failure  ? Hydrochlorothiazide Other (See Comments)  ?  Acute renal failure  ? Shellfish Allergy Other (See Comments)  ?  Gout occurs  ? ? ?Histories ?Past Medical History:  ?Diagnosis Date  ? AAA (abdominal aortic aneurysm) ([place])   ? followed by vasc surgery  ? Alcohol abuse   ? Barretts esophagus   ? bx distal esophagus 2011 neg   ? Bladder outlet obstruction 04/02/2018  ? CAD in native artery   ? a. s/p stent to prox RCA and mid Cx.  ? Cataract   ? Cholelithiasis   ? Chronic osteomyelitis involving ankle and foot, right ([place]) 07/13/2018  ? 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  ? 2006 - 3 adenomas largest 12 mm  2008 - no polyps 2013-  2 mm cecal polyp, adenoma, routine repeat colonoscopy 02/2017 approx 05/14/2017 18 polyps max 12 mm ALL adenomas recall 2019 [name], MD, [facility]     ? Dieulafoy lesion of duodenum   ? Diverticulosis of colon   ? DJD (degenerative joint disease)   ? left hip  ? Full dentures   ? upper and lower  ? GERD (gastroesophageal reflux disease)   ? diet controlled   ? Gout   ? feet  ? Hiatal hernia   ? 2 cm noted on upper endos 2011   ? Hx of adenomatous colonic polyps   ? Hyperlipidemia   ? Hypertension   ? Internal hemorrhoid   ? Lung cancer ([place])   ? 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 Myocardial infarction [facility]) 2004  ? hx of, 2 stents 2004. pt was on Plavix x 3 years then transitioned to ASA, no problems since  ? Non-small cell carcinoma of right lung, stage 1 ([place]) 01/30/2016  ? Renal failure, acute ([place])   ? hx of 2/2 medication, resolved  ? Tobacco abuse   ? smoker .5 ppd x 55 yrs  ? UTI (lower urinary tract infection)   ? ?Past Surgical History:  ?Procedure Laterality Date  ? BIOPSY  11/12/2020  ? Procedure: BIOPSY;  Surgeon: Doran Stabler, MD;  Location: [place];   Service: Gastroenterology;;  ? BIOPSY  07/25/2021  ? Procedure: BIOPSY;  Surgeon: Daryel November, MD;  Location: [place];  Service: Gastroenterology;;  ? BRONCHIAL BIOPSY  07/27/2021  ? Procedure: BRONCHIAL BIOPSIES;  Surgeon: Collene Gobble, MD;  Location: [facility] ENDOSCOPY;  Service: Cardiopulmonary;;  ? BRONCHIAL BRUSHINGS  07/27/2021  ? Procedure: BRONCHIAL BRUSHINGS;  Surgeon: Collene Gobble, MD;  Location: [facility] ENDOSCOPY;  Service: Cardiopulmonary;;  ? cataract surgery Bilateral 2018  ? removal of cataracts  ? COLONOSCOPY    ? hx polyps - [name] 02/2012  ? COLONOSCOPY WITH PROPOFOL N/A 11/12/2020  ? Procedure: COLONOSCOPY WITH PROPOFOL;  Surgeon: Doran Stabler, MD;  Location: [place];  Service: Gastroenterology;  Laterality: N/A;  ?  CORONARY ANGIOPLASTY WITH STENT PLACEMENT    ? ESOPHAGOGASTRODUODENOSCOPY (EGD) WITH PROPOFOL N/A 11/12/2020  ? Procedure: ESOPHAGOGASTRODUODENOSCOPY (EGD) WITH PROPOFOL;  Surgeon: Doran Stabler, MD;  Location: [place];  Service: Gastroenterology;  Laterality: N/A;  ? ESOPHAGOGASTRODUODENOSCOPY (EGD) WITH PROPOFOL N/A 03/06/2021  ? Procedure: ESOPHAGOGASTRODUODENOSCOPY (EGD) WITH PROPOFOL;  Surgeon: Yetta Flock, MD;  Location: [facility] ENDOSCOPY;  Service: Gastroenterology;  Laterality: N/A;  ? ESOPHAGOGASTRODUODENOSCOPY (EGD) WITH PROPOFOL N/A 07/25/2021  ? Procedure: ESOPHAGOGASTRODUODENOSCOPY (EGD) WITH PROPOFOL;  Surgeon: Daryel November, MD;  Location: [place];  Service: Gastroenterology;  Laterality: N/A;  ? flexible cystourethroscopy  05/24/06  ? HEMOSTASIS CLIP PLACEMENT  03/06/2021  ? Procedure: HEMOSTASIS CLIP PLACEMENT;  Surgeon: Yetta Flock, MD;  Location: [facility] ENDOS[facility];  Service: Gastroenterology;;  ? HOT HEMOSTASIS N/A 03/06/2021  ? Procedure: HOT HEMOSTASIS (ARGON PLASMA COAGULATION/BICAP);  Surgeon: Yetta Flock, MD;  Location: [facility] ENDOSCOPY;  Service: Gastroenterology;  Laterality: N/A;  ? INGUINAL HERNIA REPAIR  2005  ? LUNG  LOBECTOMY Right 11/07  ? RUL  ? MULTIPLE TOOTH EXTRACTIONS    ? POLYPECTOMY  11/12/2020  ? Procedure: POLYPECTOMY;  Surgeon: Doran Stabler, MD;  Location: [place];  Service: Gastroenterology;

## 2021-10-14 ENCOUNTER — Encounter: Payer: Self-pay | Admitting: Gastroenterology

## 2021-10-14 DIAGNOSIS — R7989 Other specified abnormal findings of blood chemistry: Secondary | ICD-10-CM | POA: Insufficient documentation

## 2021-10-14 DIAGNOSIS — R198 Other specified symptoms and signs involving the digestive system and abdomen: Secondary | ICD-10-CM | POA: Insufficient documentation

## 2021-10-16 ENCOUNTER — Ambulatory Visit (HOSPITAL_COMMUNITY)
Admission: RE | Admit: 2021-10-16 | Discharge: 2021-10-16 | Disposition: A | Discharge status: Home / Self Care | Payer: Medicare HMO | Source: Ambulatory Visit | Attending: Urology | Admitting: Urology

## 2021-10-16 DIAGNOSIS — C61 Malignant neoplasm of prostate: Secondary | ICD-10-CM | POA: Insufficient documentation

## 2021-10-16 DIAGNOSIS — S2241XA Multiple fractures of ribs, right side, initial encounter for closed fracture: Secondary | ICD-10-CM | POA: Diagnosis not present

## 2021-10-16 DIAGNOSIS — I2510 Atherosclerotic heart disease of native coronary artery without angina pectoris: Secondary | ICD-10-CM | POA: Diagnosis not present

## 2021-10-16 DIAGNOSIS — K8020 Calculus of gallbladder without cholecystitis without obstruction: Secondary | ICD-10-CM | POA: Diagnosis not present

## 2021-10-16 MED ORDER — PIFLIFOLASTAT F 18 (PYLARIFY) INJECTION
9.0000 | Freq: Once | INTRAVENOUS | Status: AC
  Administered 2021-10-16: 9.36 via INTRAVENOUS

## 2021-10-22 ENCOUNTER — Other Ambulatory Visit: Payer: Self-pay

## 2021-10-22 DIAGNOSIS — I714 Abdominal aortic aneurysm, without rupture, unspecified: Secondary | ICD-10-CM

## 2021-10-23 DIAGNOSIS — N312 Flaccid neuropathic bladder, not elsewhere classified: Secondary | ICD-10-CM | POA: Diagnosis not present

## 2021-10-29 ENCOUNTER — Other Ambulatory Visit: Payer: Self-pay | Admitting: Student in an Organized Health Care Education/Training Program

## 2021-10-29 DIAGNOSIS — Z79891 Long term (current) use of opiate analgesic: Secondary | ICD-10-CM

## 2021-10-29 DIAGNOSIS — M25551 Pain in right hip: Secondary | ICD-10-CM

## 2021-10-29 MED ORDER — HYDROCODONE-ACETAMINOPHEN 7.5-325 MG PO TABS: 1.0000 | ORAL_TABLET | Freq: Two times a day (BID) | ORAL | 0 refills | Status: DC | PRN

## 2021-10-29 NOTE — Telephone Encounter (Signed)
Refill Request ? ?HYDROcodone-acetaminophen (NORCO) 7.5-325 MG tablet ? ?[id number] - [place], [place] (Ph: [phone] ?

## 2021-11-01 ENCOUNTER — Ambulatory Visit (INDEPENDENT_AMBULATORY_CARE_PROVIDER_SITE_OTHER): Payer: Medicare HMO | Admitting: Vascular Surgery

## 2021-11-01 ENCOUNTER — Encounter: Payer: Self-pay | Admitting: Vascular Surgery

## 2021-11-01 ENCOUNTER — Ambulatory Visit (HOSPITAL_COMMUNITY)
Admission: RE | Admit: 2021-11-01 | Discharge: 2021-11-01 | Disposition: A | Discharge status: Home / Self Care | Payer: Medicare HMO | Source: Ambulatory Visit | Attending: Vascular Surgery | Admitting: Vascular Surgery

## 2021-11-01 VITALS — BP 137/65 | HR 70 | Temp 97.8°F | Resp 20 | Ht 70.0 in | Wt 132.0 lb

## 2021-11-01 DIAGNOSIS — I714 Abdominal aortic aneurysm, without rupture, unspecified: Secondary | ICD-10-CM

## 2021-11-01 NOTE — Progress Notes (Signed)
REASON FOR VISIT:   Follow-up of abdominal aortic aneurysm  MEDICAL ISSUES:   ABDOMINAL AORTIC ANEURYSM: This patient has a 6.6 cm infrarenal abdominal aortic aneurysm.  His risk of rupture is approximately 7 %/year.  His surgery was previously canceled because of his markedly debilitated state.  He has made significant progress since then however he still has some outstanding issues.  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  He is scheduled scheduled for upper endoscopy and ERCP in July.  Placed still has his indwelling Foley catheter for a urinary obstruction.  He is reluctant to consider proceeding with surgery understandably until some of these issues are resolved.  We will obtain a CT angio of the abdomen and pelvis in late July after he is undergone his ERCP.  If he is continuing to make progress in some of these issues have resolved we could potentially consider proceeding with endovascular repair of his aneurysm.  My main concern with endovascular repair is that he has significant tortuosity and calcific disease in his iliac arteries and is at risk for an iliac injury.  However, he would be extremely high risk for open aneurysm repair given his debilitated state.  We will make further recommendations after his CT angio in July.   HPI:   [name] is a pleasant 76 y.o. male who was being considered for endovascular repair of a 6.1 cm infrarenal abdominal aortic aneurysm.  There was some technical challenges and I felt that we would potentially have to coil the right hypogastric artery and extend down to the external iliac artery on the right.  He had undergone previous cardiac clearance and based on his echo and Myoview was felt to be a reasonable candidate for surgery.  He was felt to be intermediate risk.  However he subsequently was admitted with failure to thrive and had an indwelling Foley catheter and clearly was not strong enough for surgery and his surgery  was canceled.  In reviewing his records, he was seen by Dr. [name] and is being worked up for an adenoma of the ampulla of Vater.  He had been losing weight and not eating well and had an EGD and colonoscopy that did not show any evidence of GI malignancy.  However the ampullary adenoma was seen.  The patient is scheduled for upper esophageal endoscopic ultrasound and ERCP on 12/24/2021.  The patient also has significant GU issues and is followed by Dr. Gloriann Loan.  He continues to have an indwelling Foley catheter because of issues with obstruction.  I believe he is scheduled to see him in the next few weeks to consider removing the catheter.  Since I saw him last he states that he has put on a little bit of weight.  However he had lost 60 pounds over the last couple of years.  He denies any abdominal pain or back pain. Plan Past Medical History:  Diagnosis Date   AAA (abdominal aortic aneurysm) ([place])    followed by vasc surgery   Alcohol abuse    Barretts esophagus    bx distal esophagus 2011 neg    Bladder outlet obstruction 04/02/2018   CAD in native artery    a. s/p stent to prox RCA and mid Cx.   Cataract    Cholelithiasis    Chronic osteomyelitis involving ankle and foot, right ([place]) 07/13/2018   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   2006 - 3 adenomas largest 12 mm  2008 - no polyps 2013-  2 mm cecal polyp, adenoma, routine repeat colonoscopy 02/2017 approx 05/14/2017 18 polyps max 12 mm ALL adenomas recall 2019 [name], MD, [facility]      Dieulafoy lesion of duodenum    Diverticulosis of colon    DJD (degenerative joint disease)    left hip   Full dentures    upper and lower   GERD (gastroesophageal reflux disease)    diet controlled    Gout    feet   Hiatal hernia    2 cm noted on upper endos 2011    Hx of adenomatous colonic polyps    Hyperlipidemia    Hypertension    Internal hemorrhoid    Lung cancer ([place])    squamous cell (T2N0MX), 11/07- surgically treated, no  chemo/XRT   Myocardial infarction ([place]) 2004   hx of, 2 stents 2004. pt was on Plavix x 3 years then transitioned to ASA, no problems since   Non-small cell carcinoma of right lung, stage 1 ([place]) 01/30/2016   Renal failure, acute (HCC)    hx of 2/2 medication, resolved   Tobacco abuse    smoker .5 ppd x 55 yrs   UTI (lower urinary tract infection)     Family History  Problem Relation Age of Onset   Diabetes Mother    Hypertension Sister    Heart disease Sister    Diabetes Sister    Arthritis Brother    Gout Brother    Colon cancer Neg Hx    Esophageal cancer Neg Hx    Rectal cancer Neg Hx    Stomach cancer Neg Hx    Colon polyps Neg Hx    Inflammatory bowel disease Neg Hx    Liver disease Neg Hx    Pancreatic cancer Neg Hx     SOCIAL HISTORY: Social History   Tobacco Use   Smoking status: Every Day    Packs/day: 0.50    Years: 55.00    Pack years: 27.50    Types: Cigarettes   Smokeless tobacco: Never   Tobacco comments:    .25 packs smoked daily [facility] 08/23/21  Substance Use Topics   Alcohol use: Not Currently    Allergies  Allergen Reactions   Candesartan Cilexetil-Hctz Other (See Comments)    Acute renal failure   Hydrochlorothiazide Other (See Comments)    Acute renal failure   Shellfish Allergy Other (See Comments)    Gout occurs    Current Outpatient Medications  Medication Sig Dispense Refill   acetaminophen (TYLENOL 8 HOUR) 650 MG CR tablet Take 1 tablet (650 mg total) by mouth every 8 (eight) hours as needed for pain or fever. 30 tablet 0   albuterol (VENTOLIN HFA) 108 (90 Base) MCG/ACT inhaler INHALE 1-2 PUFFS BY MOUTH EVERY 6 HOURS AS NEEDED FOR WHEEZE OR SHORTNESS OF BREATH (Patient taking differently: Inhale 1-2 puffs into the lungs every 6 (six) hours as needed for wheezing or shortness of breath.) 6.7 each 5   allopurinol (ZYLOPRIM) 100 MG tablet Take 100 mg by mouth daily.     amLODipine (NORVASC) 10 MG tablet Take 10 mg by mouth daily.      escitalopram (LEXAPRO) 5 MG tablet Take 1 tablet (5 mg total) by mouth daily. 90 tablet 1   Ferrous Sulfate (IRON PO) Take 1 tablet by mouth daily.     HYDROcodone-acetaminophen (NORCO) 7.5-325 MG tablet Take 1 tablet by mouth 2 (two) times daily as needed for  moderate pain. 60 tablet 0   metoprolol succinate (TOPROL-XL) 100 MG 24 hr tablet Take 100 mg by mouth daily. Take with or immediately following a meal.     mirtazapine (REMERON) 7.5 MG tablet Take 1 tablet (7.5 mg total) by mouth at bedtime. 90 tablet 1   Multiple Vitamin (MULTIVITAMIN WITH MINERALS) TABS tablet Take 1 tablet by mouth daily.     Pancrelipase, Lip-Prot-Amyl, (ZENPEP) 20000-63000 units CPEP Take 3 capsules by mouth 3 (three) times daily before meals. 180 capsule 3   rosuvastatin (CRESTOR) 20 MG tablet TAKE 1 TABLET EVERY DAY 90 tablet 1   senna (SENOKOT) 8.6 MG TABS tablet Take 2 tablets (17.2 mg total) by mouth in the morning and at bedtime. (Patient taking differently: Take 1 tablet by mouth 2 (two) times daily.) 120 tablet 1   tamsulosin (FLOMAX) 0.4 MG CAPS capsule Take 2 capsules (0.8 mg total) by mouth every evening. 180 capsule 3   vitamin C (ASCORBIC ACID) 500 MG tablet Take 500 mg by mouth daily.     pantoprazole (PROTONIX) 40 MG tablet Take 1 tablet (40 mg total) by mouth daily. 90 tablet 1   potassium chloride SA (KLOR-CON M) 20 MEQ tablet Take 1 tablet (20 mEq total) by mouth daily for 10 days. 10 tablet 0   No current facility-administered medications for this visit.    REVIEW OF SYSTEMS:  [X]  denotes positive finding, [ ]  denotes negative finding Cardiac  Comments:  Chest pain or chest pressure:    Shortness of breath upon exertion: x   Short of breath when lying flat:    Irregular heart rhythm:        Vascular    Pain in calf, thigh, or hip brought on by ambulation:    Pain in feet at night that wakes you up from your sleep:     Blood clot in your veins:    Leg swelling:         Pulmonary    Oxygen  at home:    Productive cough:     Wheezing:         Neurologic    Sudden weakness in arms or legs:     Sudden numbness in arms or legs:     Sudden onset of difficulty speaking or slurred speech:    Temporary loss of vision in one eye:     Problems with dizziness:         Gastrointestinal    Blood in stool:     Vomited blood:         Genitourinary    Burning when urinating:     Blood in urine:        Psychiatric    Major depression:         Hematologic    Bleeding problems:    Problems with blood clotting too easily:        Skin    Rashes or ulcers:        Constitutional    Fever or chills:     PHYSICAL EXAM:   Vitals:   11/01/21 0957  BP: 137/65  Pulse: 70  Resp: 20  Temp: 97.8 F (36.6 C)  SpO2: 95%  Weight: 132 lb (59.9 kg)  Height: 5\' 10"  (1.778 m)    GENERAL: The patient is a well-nourished male, in no acute distress. The vital signs are documented above. CARDIAC: There is a regular rate and rhythm.  VASCULAR: I do not detect carotid bruits. He  has palpable femoral pulses.  He has monophasic but brisk dorsalis pedis and posterior tibial signals bilaterally. PULMONARY: There is good air exchange bilaterally without wheezing or rales. ABDOMEN: Soft and non-tender with normal pitched bowel sounds.  MUSCULOSKELETAL: There are no major deformities or cyanosis. NEUROLOGIC: No focal weakness or paresthesias are detected. SKIN: There are no ulcers or rashes noted. PSYCHIATRIC: The patient has a normal affect.  DATA:    DUPLEX ABDOMINAL AORTA: I have independently interpreted the duplex of his abdominal aorta.  The maximum diameter of his aneurysm is 6.6 cm.  The right common iliac artery measures 2.2 cm in maximum diameter.  The left common iliac artery measures 2.0 cm in maximum diameter.  I again reviewed his CT angio that was done in January.  This shows significant tortuosity of his left iliac especially and diffuse calcific disease.   Deitra Mayo [facility] [phone]

## 2021-11-09 DIAGNOSIS — R338 Other retention of urine: Secondary | ICD-10-CM | POA: Diagnosis not present

## 2021-11-09 DIAGNOSIS — N312 Flaccid neuropathic bladder, not elsewhere classified: Secondary | ICD-10-CM | POA: Diagnosis not present

## 2021-11-13 ENCOUNTER — Other Ambulatory Visit: Payer: Self-pay | Admitting: Student in an Organized Health Care Education/Training Program

## 2021-11-13 MED ORDER — METOPROLOL SUCCINATE ER 100 MG PO TB24: 100.0000 mg | ORAL_TABLET | Freq: Every day | ORAL | 3 refills | Status: DC

## 2021-11-13 NOTE — Telephone Encounter (Signed)
Refill Request  metoprolol succinate (TOPROL-XL) 100 MG 24 hr tablet  [id number] - [place], [place] - [address] AT [address]

## 2021-11-26 ENCOUNTER — Other Ambulatory Visit: Payer: Self-pay | Admitting: *Deleted

## 2021-11-26 ENCOUNTER — Ambulatory Visit (INDEPENDENT_AMBULATORY_CARE_PROVIDER_SITE_OTHER): Payer: Medicare Other | Admitting: Internal Medicine

## 2021-11-26 VITALS — BP 131/81 | HR 74 | Temp 98.4°F | Wt 137.6 lb

## 2021-11-26 DIAGNOSIS — E785 Hyperlipidemia, unspecified: Secondary | ICD-10-CM

## 2021-11-26 DIAGNOSIS — R630 Anorexia: Secondary | ICD-10-CM

## 2021-11-26 DIAGNOSIS — E876 Hypokalemia: Secondary | ICD-10-CM | POA: Diagnosis not present

## 2021-11-26 DIAGNOSIS — I10 Essential (primary) hypertension: Secondary | ICD-10-CM | POA: Diagnosis not present

## 2021-11-26 DIAGNOSIS — M1A0610 Idiopathic chronic gout, right knee, without tophus (tophi): Secondary | ICD-10-CM

## 2021-11-26 DIAGNOSIS — M25551 Pain in right hip: Secondary | ICD-10-CM

## 2021-11-26 DIAGNOSIS — Z79891 Long term (current) use of opiate analgesic: Secondary | ICD-10-CM

## 2021-11-26 DIAGNOSIS — F17210 Nicotine dependence, cigarettes, uncomplicated: Secondary | ICD-10-CM | POA: Diagnosis not present

## 2021-11-26 DIAGNOSIS — K219 Gastro-esophageal reflux disease without esophagitis: Secondary | ICD-10-CM

## 2021-11-26 DIAGNOSIS — F17200 Nicotine dependence, unspecified, uncomplicated: Secondary | ICD-10-CM

## 2021-11-26 DIAGNOSIS — I2510 Atherosclerotic heart disease of native coronary artery without angina pectoris: Secondary | ICD-10-CM

## 2021-11-26 DIAGNOSIS — R0609 Other forms of dyspnea: Secondary | ICD-10-CM

## 2021-11-26 NOTE — Progress Notes (Unsigned)
CC: Follow up  HPI:  [name] is a 76 y.o. with medical history as below presenting to [facility] for follow up  Please see problem-based list for further details, assessments, and plans.  Past Medical History:  Diagnosis Date   AAA (abdominal aortic aneurysm) ([place])    followed by vasc surgery   Alcohol abuse    Barretts esophagus    bx distal esophagus 2011 neg    Bladder outlet obstruction 04/02/2018   CAD in native artery    a. s/p stent to prox RCA and mid Cx.   Cataract    Cholelithiasis    Chronic osteomyelitis involving ankle and foot, right ([place]) 07/13/2018   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   2006 - 3 adenomas largest 12 mm  2008 - no polyps 2013-  2 mm cecal polyp, adenoma, routine repeat colonoscopy 02/2017 approx 05/14/2017 18 polyps max 12 mm ALL adenomas recall 2019 [name], MD, [facility]      Dieulafoy lesion of duodenum    Diverticulosis of colon    DJD (degenerative joint disease)    left hip   Full dentures    upper and lower   GERD (gastroesophageal reflux disease)    diet controlled    Gout    feet   Hiatal hernia    2 cm noted on upper endos 2011    Hx of adenomatous colonic polyps    Hyperlipidemia    Hypertension    Internal hemorrhoid    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 pt was on Plavix x 3 years then transitioned to ASA, no problems since   Non-small cell carcinoma of right lung, stage 1 ([place]) 01/30/2016   Renal failure, acute (HCC)    hx of 2/2 medication, resolved   Tobacco abuse    smoker .5 ppd x 55 yrs   UTI (lower urinary tract infection)      Current Outpatient Medications (Cardiovascular):    amLODipine (NORVASC) 10 MG tablet, Take 10 mg by mouth daily.   metoprolol succinate (TOPROL-XL) 100 MG 24 hr tablet, Take 1 tablet (100 mg total) by mouth daily. Take with or immediately following a meal.    rosuvastatin (CRESTOR) 20 MG tablet, TAKE 1 TABLET EVERY DAY  Current Outpatient Medications (Respiratory):    albuterol (VENTOLIN HFA) 108 (90 Base) MCG/ACT inhaler, INHALE 1-2 PUFFS BY MOUTH EVERY 6 HOURS AS NEEDED FOR WHEEZE OR SHORTNESS OF BREATH (Patient taking differently: Inhale 1-2 puffs into the lungs every 6 (six) hours as needed for wheezing or shortness of breath.)  Current Outpatient Medications (Analgesics):    acetaminophen (TYLENOL 8 HOUR) 650 MG CR tablet, Take 1 tablet (650 mg total) by mouth every 8 (eight) hours as needed for pain or fever.   allopurinol (ZYLOPRIM) 100 MG tablet, Take 100 mg by mouth daily.   HYDROcodone-acetaminophen (NORCO) 7.5-325 MG tablet, Take 1 tablet by mouth 2 (two) times daily as needed for moderate pain.  Current Outpatient Medications (Hematological):    Ferrous Sulfate (IRON PO), Take 1 tablet by mouth daily.  Current Outpatient Medications (Other):    escitalopram (LEXAPRO) 5 MG tablet, Take 1 tablet (5 mg total) by mouth daily.   mirtazapine (REMERON) 7.5 MG tablet, Take 1 tablet (7.5 mg total) by mouth at bedtime.   Multiple Vitamin (MULTIVITAMIN WITH MINERALS) TABS tablet, Take 1  tablet by mouth daily.   Pancrelipase, Lip-Prot-Amyl, (ZENPEP) 20000-63000 units CPEP, Take 3 capsules by mouth 3 (three) times daily before meals.   pantoprazole (PROTONIX) 40 MG tablet, Take 1 tablet (40 mg total) by mouth daily.   potassium chloride SA (KLOR-CON M) 20 MEQ tablet, Take 1 tablet (20 mEq total) by mouth daily for 10 days.   senna (SENOKOT) 8.6 MG TABS tablet, Take 2 tablets (17.2 mg total) by mouth in the morning and at bedtime. (Patient taking differently: Take 1 tablet by mouth 2 (two) times daily.)   tamsulosin (FLOMAX) 0.4 MG CAPS capsule, Take 2 capsules (0.8 mg total) by mouth every evening.   vitamin C (ASCORBIC ACID) 500 MG tablet, Take 500 mg by mouth daily.  Review of Systems:  Review of system negative unless stated in the problem list  or HPI.    Physical Exam:  Vitals:   11/26/21 1412  BP: 131/81  Pulse: 74  Temp: 98.4 F (36.9 C)  TempSrc: Oral  SpO2: 94%  Weight: 137 lb 9.6 oz (62.4 kg)    Physical Exam General: NAD HENT: NCAT Lungs: CTAB, no wheeze, rhonchi or rales.  Cardiovascular: Normal heart sounds, no r/m/g, 2+ pulses in all extremities. No LE edema Abdomen: No TTP, normal bowel sounds MSK: No asymmetry or muscle atrophy.  Skin: no lesions noted on exposed skin Neuro: Alert and oriented x4. CN grossly intact Psych: Normal mood and normal affect   Assessment & Plan:   See Encounters Tab for problem based charting.  Patient discussed with Dr. Roderic Ovens, MD    [name] is a 76 y.o. male with a pmh significant for CAD, hypertension, hyperlipidemia, AAA, prior lung cancer s/p lobectomy, GERD, hiatal hernia, colon polyps, prostate cancer s/p TURP and indewelling foley cahteter, duodenal ampullary adenoma (in situ).    Assessment/Plan BP at goal. BP in clinic today 131/81, HR 74. Reported home readings with SBP 120s-130s, and Dia 60-70s. Home medications include amlodipine 10 mg daily, metoprolol succinate 100 mg daily. Reports good poor medication compliance. Tolerating medication without adverse effects. Denies headaches, vision changes, lightheadedness, chest pain, leg swelling or changes in speech. Has DOE. Exam benign and vitals otherwise wnl. Last creatinine was 1.04 in 09/2021. Counseled on the importance of daily exercise, low salt diet, and weight loss. Continue current medications.  BP log and bring to next visit  Continue lifestyle changes   Assessment/Plan Patient has HLD with goal of secondary prevention. Last lipid panel on 10/2020 showed Chol 74, LDL 43 . Reports compliance to rosuvastatin 20 mg daily without adverse effects. Counseled on the importance of continued lifestyle modifications, including: weight loss, daily exercise, and healthy diet with limited  processed and fatty foods.  Continue crestor 20 mg daily Lipid panel ordered  Encouraged to continue lifestyle modifications   Depression Mood is well and appetite is well. Mirtazapine 7.5 mg qhs and lexapro 5 mg qd  Gout No recent flare ups. On allopurinol 100 mg daily.   Dyspnea Present with exertion. Smoking hx of 55 years of 0.5 ppd-.75 ppd. Now 6 cigarettes a day.

## 2021-11-26 NOTE — Patient Instructions (Addendum)
[name], it was a pleasure seeing you today! You endorsed feeling well today. Below are some of the things we talked about this visit. We look forward to seeing you in the follow up appointment!  Today we discussed: Today we went over your medications and your chronic conditions.  We will continue your blood pressure medications as your blood pressure is doing well.  Your potassium was noted to be low previously so we will check it again today and I will let you know the results. For your cholesterol continue taking your medications and we will check your levels today as well. Continue taking all your other medications as indicated on the bottles.  I have ordered the following labs today:  Lab Orders         Lipid Profile         BMP8+Anion Gap       Referrals ordered today:   Referral Orders  No referral(s) requested today      I have ordered the following medication/changed the following medications:   Stop the following medications: There are no discontinued medications.   Start the following medications: No orders of the defined types were placed in this encounter.    Follow-up: 3 months  Please make sure to arrive 15 minutes prior to your next appointment. If you arrive late, you may be asked to reschedule.   We look forward to seeing you next time. Please call our clinic at [phone] if you have any questions or concerns. The best time to call is Monday-Friday from 9am-4pm, but there is someone available 24/7. If after hours or the weekend, call the main hospital number and ask for the Internal Medicine Resident On-Call. If you need medication refills, please notify your pharmacy one week in advance and they will send [place] a request.  Thank you for letting us take part in your care. Wishing you the best!  Thank you, Idamae Schuller, MD

## 2021-11-27 LAB — LIPID PANEL
Chol/HDL Ratio: 2.9 ratio (ref 0.0–5.0)
Cholesterol, Total: 128 mg/dL (ref 100–199)
HDL: 44 mg/dL (ref >39)
LDL Chol Calc (NIH): 71 mg/dL (ref 0–99)
Triglycerides: 60 mg/dL (ref 0–149)
VLDL Cholesterol Cal: 13 mg/dL (ref 5–40)

## 2021-11-27 LAB — BMP8+ANION GAP
Anion Gap: 16 mmol/L (ref 10.0–18.0)
BUN/Creatinine Ratio: 11 (ref 10–24)
BUN: 13 mg/dL (ref 8–27)
CO2: 23 mmol/L (ref 20–29)
Calcium: 9.5 mg/dL (ref 8.6–10.2)
Chloride: 106 mmol/L (ref 96–106)
Creatinine, Ser: 1.14 mg/dL (ref 0.76–1.27)
Glucose: 89 mg/dL (ref 70–99)
Potassium: 3.8 mmol/L (ref 3.5–5.2)
Sodium: 145 mmol/L — ABNORMAL HIGH (ref 134–144)
eGFR: 67 mL/min/{1.73_m2} (ref >59)

## 2021-11-27 MED ORDER — HYDROCODONE-ACETAMINOPHEN 7.5-325 MG PO TABS: 1.0000 | ORAL_TABLET | Freq: Two times a day (BID) | ORAL | 0 refills | Status: DC | PRN

## 2021-11-27 NOTE — Assessment & Plan Note (Signed)
Patient has GERD and takes protonix 40 mg daily.  His symptoms are well controlled.  -We will continue Protonix 40 mg daily.

## 2021-11-28 DIAGNOSIS — R630 Anorexia: Secondary | ICD-10-CM | POA: Insufficient documentation

## 2021-11-28 DIAGNOSIS — R3914 Feeling of incomplete bladder emptying: Secondary | ICD-10-CM | POA: Diagnosis not present

## 2021-11-28 NOTE — Assessment & Plan Note (Addendum)
Patient had weight loss of 40 lbs and was started mirtazapine 7.5 mg nightly. He is endorsing good appetite with weight gain of 30 lbs since last PCP visit. He endorses good mood. He was previously on lexapro 5 mg daily which we may be able to discontinue given improvement on mirtazapine. -We will continue with mirtazapine 7.5 mg nightly. -CTM

## 2021-11-28 NOTE — Assessment & Plan Note (Signed)
Patient complained of DOE at today's visit. His lungs were clear to auscultation and he appeared euvolemic on exam with no LE edema. He has been using his albuterol inhaler 3-4 times a day for his shortness of breath. Patient has significant smoking hx and would benefit from PFTs. I counseled him on smoking cessation. -Referral placed for PFTs.

## 2021-11-28 NOTE — Assessment & Plan Note (Signed)
Patient has HLD with goal of secondary prevention. Last lipid panel on 10/2020 showed Chol 74, LDL 43 . Reports compliance to rosuvastatin 20 mg daily without adverse effects. Counseled on the importance of continued lifestyle modifications, including: daily exercise, and healthy diet with limited processed and fatty foods.  -Continue crestor 20 mg daily -Lipid panel ordered  -Encouraged to continue lifestyle modifications   Addendum: Lipid panel shows total cholesterol 128 and LDL 71.

## 2021-11-28 NOTE — Assessment & Plan Note (Signed)
Patient was still taking allopurinol which appears to be secondary to his fear of having a painful gout attack. Re-enforced previous recommendation of stopping allopurinol as he has been without flare since 2019.

## 2021-11-28 NOTE — Assessment & Plan Note (Addendum)
BP at goal. BP in clinic today 131/81, HR 74. Reported home readings with SBP 120s-130s, and Dia 60-70s. Home medications include amlodipine 10 mg daily, metoprolol succinate 100 mg daily. Reports good medication compliance. Tolerating medication without adverse effects. Denies headaches, vision changes, lightheadedness, chest pain, leg swelling or changes in speech. Does report DOE (see tab). Exam benign and vitals otherwise wnl. Last creatinine was 1.04 in 09/2021. Counseled on the importance of daily exercise, low salt diet. -Continue current medications.  -BP log and bring to next visit  -Continue lifestyle changes -BMP next visit  Addendum: BMP shows normal K and normal renal function.

## 2021-11-29 ENCOUNTER — Telehealth: Payer: Self-pay | Admitting: Gastroenterology

## 2021-11-29 ENCOUNTER — Other Ambulatory Visit: Payer: Self-pay

## 2021-11-29 DIAGNOSIS — I714 Abdominal aortic aneurysm, without rupture, unspecified: Secondary | ICD-10-CM

## 2021-11-29 NOTE — Telephone Encounter (Signed)
Inbound call from patient stating he would like a call back to discuss his procedure on  7/10 with Dr. Rush Landmark at [facility]. Please advise.

## 2021-11-29 NOTE — Telephone Encounter (Signed)
I returned the pt call and we discussed the procedure date and time as well as how to prep.  All questions answered to the best of my ability.  Nothing further at this time.  I did advise to call if he has any further questions.

## 2021-11-30 NOTE — Progress Notes (Signed)
Internal Medicine Clinic Attending  Case discussed with the resident at the time of the visit.  We reviewed the resident's history and exam and pertinent patient test results.  I agree with the assessment, diagnosis, and plan of care documented in the resident's note.  

## 2021-12-10 ENCOUNTER — Other Ambulatory Visit: Payer: Self-pay

## 2021-12-10 MED ORDER — AMLODIPINE BESYLATE 10 MG PO TABS: 10.0000 mg | ORAL_TABLET | Freq: Every day | ORAL | 3 refills | Status: DC

## 2021-12-12 ENCOUNTER — Encounter (HOSPITAL_COMMUNITY): Payer: Self-pay | Admitting: Gastroenterology

## 2021-12-21 ENCOUNTER — Other Ambulatory Visit: Payer: Self-pay | Admitting: Student in an Organized Health Care Education/Training Program

## 2021-12-23 NOTE — Anesthesia Preprocedure Evaluation (Addendum)
Anesthesia Evaluation  Patient identified by MRN, date of birth, ID band Patient awake    Reviewed: Allergy & Precautions, NPO status , Patient's Chart, lab work & pertinent test results, reviewed documented beta blocker date and time   Airway Mallampati: II  TM Distance: >3 FB Neck ROM: Full    Dental  (+) Edentulous Upper, Edentulous Lower, Dental Advisory Given   Pulmonary neg pulmonary ROS, Current SmokerPatient did not abstain from smoking.,  H/o right NSCLC   Pulmonary exam normal breath sounds clear to auscultation       Cardiovascular hypertension, Pt. on home beta blockers and Pt. on medications + CAD, + Past MI, + Cardiac Stents and + Peripheral Vascular Disease  Normal cardiovascular exam Rhythm:Regular Rate:Normal  TTE 2022 1. Left ventricular ejection fraction, by estimation, is 55 to 60%. The  left ventricle has normal function. Left ventricular endocardial border  not optimally defined to evaluate regional wall motion. There is mild left  ventricular hypertrophy. Left  ventricular diastolic parameters are consistent with Grade I diastolic  dysfunction (impaired relaxation).  2. Right ventricular systolic function was not well visualized. The right  ventricular size is not well visualized. There is normal pulmonary artery  systolic pressure. The estimated right ventricular systolic pressure is  08.6 mmHg.  3. The mitral valve is normal in structure. No evidence of mitral valve  regurgitation. No evidence of mitral stenosis.  4. The aortic valve is tricuspid. Aortic valve regurgitation is not  visualized. No aortic stenosis is present.  5. The inferior vena cava is normal in size with greater than 50%  respiratory variability, suggesting right atrial pressure of 3 mmHg.  6. Technically difficult study with poor acoustic windows.  Stress Test 2022 1.  Reduced apical counts with normal wall motion consistent  with apical thinning artifact.  No evidence of ischemia or infarction. 2.  Normal LVEF for this modality, 47%. 3.  This is a low risk study.   AAA Duplex 2023 Summary:  Abdominal Aorta: There is evidence of abnormal dilatation of the mid and  distal Abdominal aorta. Previous diameter measurement was 6.1 x5.55 cm  obtained on 06/19/21 by CTA   Neuro/Psych negative neurological ROS  negative psych ROS   GI/Hepatic hiatal hernia, GERD  ,(+)     substance abuse  alcohol use,   Endo/Other  negative endocrine ROS  Renal/GU negative Renal ROS  negative genitourinary   Musculoskeletal  (+) Arthritis ,   Abdominal   Peds  Hematology negative hematology ROS (+)   Anesthesia Other Findings   Reproductive/Obstetrics                            Anesthesia Physical Anesthesia Plan  ASA: 3  Anesthesia Plan: General   Post-op Pain Management: Minimal or no pain anticipated   Induction: Intravenous  PONV Risk Score and Plan: 1 and Dexamethasone, Ondansetron and Treatment may vary due to age or medical condition  Airway Management Planned: Oral ETT  Additional Equipment:   Intra-op Plan:   Post-operative Plan: Extubation in OR  Informed Consent: I have reviewed the patients History and Physical, chart, labs and discussed the procedure including the risks, benefits and alternatives for the proposed anesthesia with the patient or authorized representative who has indicated his/her understanding and acceptance.     Dental advisory given  Plan Discussed with: CRNA  Anesthesia Plan Comments:        Anesthesia Quick Evaluation

## 2021-12-24 ENCOUNTER — Ambulatory Visit (HOSPITAL_COMMUNITY)
Admission: RE | Admit: 2021-12-24 | Discharge: 2021-12-24 | Disposition: A | Discharge status: Home / Self Care | Payer: Medicare Other | Attending: Gastroenterology | Admitting: Gastroenterology

## 2021-12-24 ENCOUNTER — Encounter (HOSPITAL_COMMUNITY): Payer: Self-pay | Admitting: Gastroenterology

## 2021-12-24 ENCOUNTER — Ambulatory Visit (HOSPITAL_BASED_OUTPATIENT_CLINIC_OR_DEPARTMENT_OTHER): Payer: Medicare Other | Admitting: Anesthesiology

## 2021-12-24 ENCOUNTER — Encounter (HOSPITAL_COMMUNITY)
Admission: RE | Disposition: A | Discharge status: Home / Self Care | Payer: Self-pay | Source: Home / Self Care | Attending: Gastroenterology

## 2021-12-24 ENCOUNTER — Ambulatory Visit (HOSPITAL_COMMUNITY): Payer: Medicare Other | Admitting: Anesthesiology

## 2021-12-24 ENCOUNTER — Other Ambulatory Visit: Payer: Self-pay

## 2021-12-24 DIAGNOSIS — D135 Benign neoplasm of extrahepatic bile ducts: Secondary | ICD-10-CM

## 2021-12-24 DIAGNOSIS — M1990 Unspecified osteoarthritis, unspecified site: Secondary | ICD-10-CM | POA: Diagnosis not present

## 2021-12-24 DIAGNOSIS — K449 Diaphragmatic hernia without obstruction or gangrene: Secondary | ICD-10-CM | POA: Diagnosis not present

## 2021-12-24 DIAGNOSIS — N2889 Other specified disorders of kidney and ureter: Secondary | ICD-10-CM | POA: Diagnosis not present

## 2021-12-24 DIAGNOSIS — K3189 Other diseases of stomach and duodenum: Secondary | ICD-10-CM | POA: Insufficient documentation

## 2021-12-24 DIAGNOSIS — Z955 Presence of coronary angioplasty implant and graft: Secondary | ICD-10-CM | POA: Diagnosis not present

## 2021-12-24 DIAGNOSIS — K2289 Other specified disease of esophagus: Secondary | ICD-10-CM | POA: Diagnosis not present

## 2021-12-24 DIAGNOSIS — K219 Gastro-esophageal reflux disease without esophagitis: Secondary | ICD-10-CM | POA: Diagnosis not present

## 2021-12-24 DIAGNOSIS — R198 Other specified symptoms and signs involving the digestive system and abdomen: Secondary | ICD-10-CM

## 2021-12-24 DIAGNOSIS — I252 Old myocardial infarction: Secondary | ICD-10-CM | POA: Insufficient documentation

## 2021-12-24 DIAGNOSIS — I2510 Atherosclerotic heart disease of native coronary artery without angina pectoris: Secondary | ICD-10-CM | POA: Insufficient documentation

## 2021-12-24 DIAGNOSIS — K8020 Calculus of gallbladder without cholecystitis without obstruction: Secondary | ICD-10-CM | POA: Diagnosis not present

## 2021-12-24 DIAGNOSIS — I10 Essential (primary) hypertension: Secondary | ICD-10-CM | POA: Insufficient documentation

## 2021-12-24 DIAGNOSIS — K317 Polyp of stomach and duodenum: Secondary | ICD-10-CM | POA: Diagnosis not present

## 2021-12-24 DIAGNOSIS — I739 Peripheral vascular disease, unspecified: Secondary | ICD-10-CM | POA: Diagnosis not present

## 2021-12-24 DIAGNOSIS — K839 Disease of biliary tract, unspecified: Secondary | ICD-10-CM | POA: Diagnosis not present

## 2021-12-24 DIAGNOSIS — R7989 Other specified abnormal findings of blood chemistry: Secondary | ICD-10-CM

## 2021-12-24 DIAGNOSIS — K869 Disease of pancreas, unspecified: Secondary | ICD-10-CM | POA: Diagnosis not present

## 2021-12-24 DIAGNOSIS — D132 Benign neoplasm of duodenum: Secondary | ICD-10-CM | POA: Diagnosis not present

## 2021-12-24 DIAGNOSIS — F17210 Nicotine dependence, cigarettes, uncomplicated: Secondary | ICD-10-CM | POA: Diagnosis not present

## 2021-12-24 HISTORY — PX: POLYPECTOMY: SHX5525

## 2021-12-24 HISTORY — PX: SUBMUCOSAL LIFTING INJECTION: SHX6855

## 2021-12-24 HISTORY — PX: ESOPHAGOGASTRODUODENOSCOPY (EGD) WITH PROPOFOL: SHX5813

## 2021-12-24 HISTORY — PX: BIOPSY: SHX5522

## 2021-12-24 HISTORY — PX: UPPER ESOPHAGEAL ENDOSCOPIC ULTRASOUND (EUS): SHX6562

## 2021-12-24 HISTORY — PX: HEMOSTASIS CLIP PLACEMENT: SHX6857

## 2021-12-24 SURGERY — ESOPHAGOGASTRODUODENOSCOPY (EGD) WITH PROPOFOL
Anesthesia: General

## 2021-12-24 MED ORDER — LACTATED RINGERS IV SOLN: INTRAVENOUS | Status: DC | PRN

## 2021-12-24 MED ORDER — GLUCAGON HCL RDNA (DIAGNOSTIC) 1 MG IJ SOLR
INTRAMUSCULAR | Status: AC
Start: 2021-12-24 — End: ?
  Filled 2021-12-24: qty 1

## 2021-12-24 MED ORDER — ONDANSETRON HCL 4 MG/2ML IJ SOLN
INTRAMUSCULAR | Status: DC | PRN
  Administered 2021-12-24: 4 mg via INTRAVENOUS

## 2021-12-24 MED ORDER — EPINEPHRINE 1 MG/10ML IJ SOSY
PREFILLED_SYRINGE | INTRAMUSCULAR | Status: AC
  Filled 2021-12-24: qty 10

## 2021-12-24 MED ORDER — FENTANYL CITRATE (PF) 100 MCG/2ML IJ SOLN
INTRAMUSCULAR | Status: AC
  Filled 2021-12-24: qty 2

## 2021-12-24 MED ORDER — LIDOCAINE 2% (20 MG/ML) 5 ML SYRINGE
INTRAMUSCULAR | Status: DC | PRN
  Administered 2021-12-24: 60 mg via INTRAVENOUS

## 2021-12-24 MED ORDER — CIPROFLOXACIN IN D5W 400 MG/200ML IV SOLN
INTRAVENOUS | Status: AC
  Filled 2021-12-24: qty 200

## 2021-12-24 MED ORDER — PROPOFOL 10 MG/ML IV BOLUS
INTRAVENOUS | Status: AC
  Filled 2021-12-24: qty 20

## 2021-12-24 MED ORDER — EPHEDRINE SULFATE-NACL 50-0.9 MG/10ML-% IV SOSY
PREFILLED_SYRINGE | INTRAVENOUS | Status: DC | PRN
  Administered 2021-12-24: 15 mg via INTRAVENOUS
  Administered 2021-12-24 (×2): 10 mg via INTRAVENOUS

## 2021-12-24 MED ORDER — SUGAMMADEX SODIUM 200 MG/2ML IV SOLN
INTRAVENOUS | Status: DC | PRN
  Administered 2021-12-24: 150 mg via INTRAVENOUS

## 2021-12-24 MED ORDER — DEXAMETHASONE SODIUM PHOSPHATE 10 MG/ML IJ SOLN
INTRAMUSCULAR | Status: DC | PRN
  Administered 2021-12-24: 10 mg via INTRAVENOUS

## 2021-12-24 MED ORDER — PROPOFOL 10 MG/ML IV BOLUS
INTRAVENOUS | Status: DC | PRN
  Administered 2021-12-24: 70 mg via INTRAVENOUS

## 2021-12-24 MED ORDER — PHENYLEPHRINE HCL-NACL 20-0.9 MG/250ML-% IV SOLN
INTRAVENOUS | Status: DC | PRN
  Administered 2021-12-24: 30 ug/min via INTRAVENOUS

## 2021-12-24 MED ORDER — ROCURONIUM BROMIDE 10 MG/ML (PF) SYRINGE
PREFILLED_SYRINGE | INTRAVENOUS | Status: DC | PRN
  Administered 2021-12-24: 60 mg via INTRAVENOUS

## 2021-12-24 MED ORDER — INDOMETHACIN 50 MG RE SUPP
RECTAL | Status: AC
Start: 2021-12-24 — End: ?
  Filled 2021-12-24: qty 2

## 2021-12-24 MED ORDER — PHENYLEPHRINE HCL (PRESSORS) 10 MG/ML IV SOLN
INTRAVENOUS | Status: AC
  Filled 2021-12-24: qty 1

## 2021-12-24 MED ORDER — PHENYLEPHRINE 80 MCG/ML (10ML) SYRINGE FOR IV PUSH (FOR BLOOD PRESSURE SUPPORT)
PREFILLED_SYRINGE | INTRAVENOUS | Status: DC | PRN
  Administered 2021-12-24 (×3): 80 ug via INTRAVENOUS

## 2021-12-24 MED ORDER — FENTANYL CITRATE (PF) 100 MCG/2ML IJ SOLN
INTRAMUSCULAR | Status: DC | PRN
  Administered 2021-12-24: 50 ug via INTRAVENOUS

## 2021-12-24 MED ORDER — SODIUM CHLORIDE 0.9 % IV SOLN: INTRAVENOUS | Status: DC

## 2021-12-24 NOTE — Anesthesia Procedure Notes (Signed)
Procedure Name: Intubation Date/Time: 12/24/2021 7:57 AM  Performed by: Gean Maidens, CRNAPre-anesthesia Checklist: Patient identified, Emergency Drugs available, Suction available, Patient being monitored and Timeout performed Patient Re-evaluated:Patient Re-evaluated prior to induction Oxygen Delivery Method: Circle system utilized Preoxygenation: Pre-oxygenation with 100% oxygen Induction Type: IV induction Ventilation: Mask ventilation without difficulty Laryngoscope Size: Mac and 4 Grade View: Grade I Tube type: Oral Tube size: 7.5 mm Number of attempts: 1 Airway Equipment and Method: Stylet Placement Confirmation: ETT inserted through vocal cords under direct vision, positive ETCO2 and breath sounds checked- equal and bilateral Secured at: 23 cm Tube secured with: Tape Dental Injury: Teeth and Oropharynx as per pre-operative assessment

## 2021-12-24 NOTE — Anesthesia Postprocedure Evaluation (Signed)
Anesthesia Post Note  Patient: [name]  Procedure(s) Performed: UPPER ESOPHAGEAL ENDOSCOPIC ULTRASOUND (EUS) ESOPHAGOGASTRODUODENOSCOPY (EGD) WITH PROPOFOL POLYPECTOMY BIOPSY SUBMUCOSAL LIFTING INJECTION     Patient location during evaluation: PACU Anesthesia Type: General Level of consciousness: awake and alert Pain management: pain level controlled Vital Signs Assessment: post-procedure vital signs reviewed and stable Respiratory status: spontaneous breathing, nonlabored ventilation, respiratory function stable and patient connected to nasal cannula oxygen Cardiovascular status: blood pressure returned to baseline and stable Postop Assessment: no apparent nausea or vomiting Anesthetic complications: no   No notable events documented.  Last Vitals:  Vitals:   12/24/21 1020 12/24/21 1030  BP: (!) 113/51 (!) 117/53  Pulse: (!) 42 (!) 58  Resp: 17 10  Temp:    SpO2: 92% 94%    Last Pain:  Vitals:   12/24/21 1030  TempSrc:   PainSc: 0-No pain                 [name]

## 2021-12-24 NOTE — Discharge Instructions (Signed)
YOU HAD AN ENDOSCOPIC PROCEDURE TODAY: Refer to the procedure report and other information in the discharge instructions given to you for any specific questions about what was found during the examination. If this information does not answer your questions, please call [place] office at [phone] to clarify.  ° °YOU SHOULD EXPECT: Some feelings of bloating in the abdomen. Passage of more gas than usual. Walking can help get rid of the air that was put into your GI tract during the procedure and reduce the bloating. If you had a lower endoscopy (such as a colonoscopy or flexible sigmoidoscopy) you may notice spotting of blood in your stool or on the toilet paper. Some abdominal soreness may be present for a day or two, also. ° °DIET: Your first meal following the procedure should be a light meal and then it is ok to progress to your normal diet. A half-sandwich or bowl of soup is an example of a good first meal. Heavy or fried foods are harder to digest and may make you feel nauseous or bloated. Drink plenty of fluids but you should avoid alcoholic beverages for 24 hours. If you had a esophageal dilation, please see attached instructions for diet.   ° °ACTIVITY: Your care partner should take you home directly after the procedure. You should plan to take it easy, moving slowly for the rest of the day. You can resume normal activity the day after the procedure however YOU SHOULD NOT DRIVE, use power tools, machinery or perform tasks that involve climbing or major physical exertion for 24 hours (because of the sedation medicines used during the test).  ° °SYMPTOMS TO REPORT IMMEDIATELY: °A gastroenterologist can be reached at any hour. Please call [phone]  for any of the following symptoms:  °Following lower endoscopy (colonoscopy, flexible sigmoidoscopy) °Excessive amounts of blood in the stool  °Significant tenderness, worsening of abdominal pains  °Swelling of the abdomen that is new, acute  °Fever of 100° or  higher  °Following upper endoscopy (EGD, EUS, ERCP, esophageal dilation) °Vomiting of blood or coffee ground material  °New, significant abdominal pain  °New, significant chest pain or pain under the shoulder blades  °Painful or persistently difficult swallowing  °New shortness of breath  °Black, tarry-looking or red, bloody stools ° °FOLLOW UP:  °If any biopsies were taken you will be contacted by phone or by letter within the next 1-3 weeks. Call [phone]  if you have not heard about the biopsies in 3 weeks.  °Please also call with any specific questions about appointments or follow up tests. ° °

## 2021-12-24 NOTE — H&P (Signed)
GASTROENTEROLOGY PROCEDURE H&P NOTE   Primary Care Physician: Axel Filler, MD  HPI: [name] is a 76 y.o. 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  Past Medical History:  Diagnosis Date   AAA (abdominal aortic aneurysm) ([place])    followed by vasc surgery   Alcohol abuse    Barretts esophagus    bx distal esophagus 2011 neg    Bladder outlet obstruction 04/02/2018   CAD in native artery    a. s/p stent to prox RCA and mid Cx.   Cataract    Cholelithiasis    Chronic osteomyelitis involving ankle and foot, right ([place]) 07/13/2018   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   2006 - 3 adenomas largest 12 mm  2008 - no polyps 2013-  2 mm cecal polyp, adenoma, routine repeat colonoscopy 02/2017 approx 05/14/2017 18 polyps max 12 mm ALL adenomas recall 2019 [name], MD, [facility]      Dieulafoy lesion of duodenum    Diverticulosis of colon    DJD (degenerative joint disease)    left hip   Full dentures    upper and lower   GERD (gastroesophageal reflux disease)    diet controlled    Gout    feet   Hiatal hernia    2 cm noted on upper endos 2011    Hx of adenomatous colonic polyps    Hyperlipidemia    Hypertension    Internal hemorrhoid    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 pt was on Plavix x 3 years then transitioned to ASA, no problems since   Non-small cell carcinoma of right lung, stage 1 ([place]) 01/30/2016   Renal failure, acute ([facility])    hx of 2/2 medication, resolved   Tobacco abuse    smoker .5 ppd x 55 yrs   UTI (lower urinary tract infection)    Past Surgical History:  Procedure Laterality Date   BIOPSY  11/12/2020   Procedure: BIOPSY;  Surgeon: Doran Stabler, MD;  Location: [facility];  Service:  Gastroenterology;;   BIOPSY  07/25/2021   Procedure: BIOPSY;  Surgeon: Daryel November, MD;  Location: [facility];  Service: Gastroenterology;;   BRONCHIAL BIOPSY  07/27/2021   Procedure: BRONCHIAL BIOPSIES;  Surgeon: Collene Gobble, MD;  Location: [facility];  Service: Cardiopulmonary;;   BRONCHIAL BRUSHINGS  07/27/2021   Procedure: BRONCHIAL BRUSHINGS;  Surgeon: Collene Gobble, MD;  Location: [facility] ENDOSCOPY;  Service: Cardiopulmonary;;   cataract surgery Bilateral 2018   removal of cataracts   COLONOSCOPY     hx polyps - [name] 02/2012   COLONOSCOPY WITH PROPOFOL N/A 11/12/2020   Procedure: COLONOSCOPY WITH PROPOFOL;  Surgeon: Doran Stabler, MD;  Location: [place];  Service: Gastroenterology;  Laterality: N/A;   CORONARY ANGIOPLASTY WITH STENT PLACEMENT     ESOPHAGOGASTRODUODENOSCOPY (EGD) WITH PROPOFOL N/A 11/12/2020   Procedure: ESOPHAGOGASTRODUODENOSCOPY (EGD) WITH PROPOFOL;  Surgeon: Doran Stabler, MD;  Location: [place];  Service: Gastroenterology;  Laterality: N/A;   ESOPHAGOGASTRODUODENOSCOPY (EGD) WITH PROPOFOL N/A 03/06/2021   Procedure: ESOPHAGOGASTRODUODENOSCOPY (EGD) WITH PROPOFOL;  Surgeon: Yetta Flock, MD;  Location: [facility];  Service: Gastroenterology;  Laterality: N/A;   ESOPHAGOGASTRODUODENOSCOPY (EGD) WITH PROPOFOL  N/A 07/25/2021   Procedure: ESOPHAGOGASTRODUODENOSCOPY (EGD) WITH PROPOFOL;  Surgeon: Daryel November, MD;  Location: [place];  Service: Gastroenterology;  Laterality: N/A;   flexible cystourethroscopy  05/24/06   HEMOSTASIS CLIP PLACEMENT  03/06/2021   Procedure: HEMOSTASIS CLIP PLACEMENT;  Surgeon: Yetta Flock, MD;  Location: [facility];  Service: Gastroenterology;;   HOT HEMOSTASIS N/A 03/06/2021   Procedure: HOT HEMOSTASIS (ARGON PLASMA COAGULATION/BICAP);  Surgeon: Yetta Flock, MD;  Location: [facility];  Service: Gastroenterology;  Laterality: N/A;   INGUINAL HERNIA REPAIR  2005   LUNG  LOBECTOMY Right 11/07   RUL   MULTIPLE TOOTH EXTRACTIONS     POLYPECTOMY  11/12/2020   Procedure: POLYPECTOMY;  Surgeon: Doran Stabler, MD;  Location: [facility] ENDOSCOPY;  Service: Gastroenterology;;   TRANSURETHRAL RESECTION OF PROSTATE N/A 05/02/2021   Procedure: TRANSURETHRAL RESECTION OF THE PROSTATE (TURP);  Surgeon: Lucas Mallow, MD;  Location: [facility];  Service: Urology;  Laterality: N/A;   UPPER GASTROINTESTINAL ENDOSCOPY     VIDEO BRONCHOSCOPY  07/27/2021   Procedure: VIDEO BRONCHOSCOPY WITHOUT FLUORO;  Surgeon: Collene Gobble, MD;  Location: [facility] ENDOSCOPY;  Service: Cardiopulmonary;;   Current Facility-Administered Medications  Medication Dose Route Frequency Provider Last Rate Last Admin   0.9 %  sodium chloride infusion   Intravenous Continuous Mansouraty, Telford Nab., MD        Current Facility-Administered Medications:    0.9 %  sodium chloride infusion, , Intravenous, Continuous, Mansouraty, Telford Nab., MD Allergies  Allergen Reactions   Candesartan Cilexetil-Hctz Other (See Comments)    Acute renal failure   Hydrochlorothiazide Other (See Comments)    Acute renal failure   Shellfish Allergy Other (See Comments)    Gout occurs   Family History  Problem Relation Age of Onset   Diabetes Mother    Hypertension Sister    Heart disease Sister    Diabetes Sister    Arthritis Brother    Gout Brother    Colon cancer Neg Hx    Esophageal cancer Neg Hx    Rectal cancer Neg Hx    Stomach cancer Neg Hx    Colon polyps Neg Hx    Inflammatory bowel disease Neg Hx    Liver disease Neg Hx    Pancreatic cancer Neg Hx    Social History   Socioeconomic History   Marital status: Single    Spouse name: Not on file   Number of children: 0   Years of education: Not on file   Highest education level: Not on file  Occupational History   Not on file  Tobacco Use   Smoking status: Every Day    Packs/day: 0.50    Years: 55.00    Total pack years: 27.50    Types:  Cigarettes   Smokeless tobacco: Never   Tobacco comments:    .25 packs smoked daily [facility] 08/23/21  Vaping Use   Vaping Use: Never used  Substance and Sexual Activity   Alcohol use: Not Currently   Drug use: No   Sexual activity: Not on file  Other Topics Concern   Not on file  Social History Narrative   Not on file   Social Determinants of Health   Financial Resource Strain: Not on file  Food Insecurity: Not on file  Transportation Needs: Not on file  Physical Activity: Not on file  Stress: Not on file  Social Connections: Not on file  Intimate Partner Violence: Not on file  Physical Exam: Today's Vitals   12/24/21 0705  BP: (!) 164/87  Pulse: 62  Resp: 12  Temp: 97.6 F (36.4 C)  TempSrc: Temporal  SpO2: 93%  Weight: 62.1 kg  Height: 6' (1.829 m)  PainSc: 0-No pain   Body mass index is 18.58 kg/m. GEN: NAD EYE: Sclerae anicteric ENT: MMM CV: Non-tachycardic GI: Soft, NT/ND NEURO:  Alert & Oriented x 3  Lab Results: No results for input(s): "WBC", "HGB", "HCT", "PLT" in the last 72 hours. BMET No results for input(s): "NA", "K", "CL", "CO2", "GLUCOSE", "BUN", "CREATININE", "CALCIUM" in the last 72 hours. LFT No results for input(s): "PROT", "ALBUMIN", "AST", "ALT", "ALKPHOS", "BILITOT", "BILIDIR", "IBILI" in the last 72 hours. PT/INR No results for input(s): "LABPROT", "INR" in the last 72 hours.   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  The risks of an EUS including intestinal perforation, bleeding, infection, aspiration, and medication effects were discussed as was the possibility it may not give a definitive diagnosis if a biopsy is performed.  When a biopsy of the pancreas is done as part of the EUS, there is an additional risk of pancreatitis at the rate of about 1-2%.  It was  explained that procedure related pancreatitis is typically mild, although it can be severe and even life threatening, which is why we do not perform random pancreatic biopsies and only biopsy a lesion/area we feel is concerning enough to warrant the risk.  The risks of an ERCP were discussed at length, including but not limited to the risk of perforation, bleeding, abdominal pain, post-ERCP pancreatitis (while usually mild can be severe and even life threatening).   The risks and benefits of endoscopic evaluation/treatment were discussed with the patient and/or family; these include but are not limited to the risk of perforation, infection, bleeding, missed lesions, lack of diagnosis, severe illness requiring hospitalization, as well as anesthesia and sedation related illnesses.  The patient's history has been reviewed, patient examined, no change in status, and deemed stable for procedure.  The patient and/or family is agreeable to proceed.    Justice Britain, MD [facility] Office # [phone]

## 2021-12-24 NOTE — Transfer of Care (Signed)
Immediate Anesthesia Transfer of Care Note  Patient: [name]  Procedure(s) Performed: UPPER ESOPHAGEAL ENDOSCOPIC ULTRASOUND (EUS) ESOPHAGOGASTRODUODENOSCOPY (EGD) WITH PROPOFOL POLYPECTOMY BIOPSY SUBMUCOSAL LIFTING INJECTION  Patient Location: PACU  Anesthesia Type:General  Level of Consciousness: sedated, patient cooperative and responds to stimulation  Airway & Oxygen Therapy: Patient Spontanous Breathing and Patient connected to face mask oxygen  Post-op Assessment: Report given to RN and Post -op Vital signs reviewed and stable  Post vital signs: Reviewed and stable  Last Vitals:  Vitals Value Taken Time  BP 119/67 12/24/21 1001  Temp    Pulse 57 12/24/21 1004  Resp 15 12/24/21 1004  SpO2 100 % 12/24/21 1004  Vitals shown include unvalidated device data.  Last Pain:  Vitals:   12/24/21 0705  TempSrc: Temporal  PainSc: 0-No pain         Complications: No notable events documented.

## 2021-12-24 NOTE — Op Note (Signed)
[facility] Patient Name: [name] Procedure Date: 12/24/2021 MRN: [id number] Attending MD: Justice Britain , MD Date of Birth: 18-Dec-1945 CSN: [id number] Age: 76 Admit Type: Outpatient Procedure:                Upper EUS Indications:              Mucosal mass/polyp involving the major papilla                            (found on endoscopy), Polyps in the duodenum, For                            therapy of polyps in the duodenum Providers:                Justice Britain, MD, Carlyn Reichert, RN, William Dalton, Technician Referring MD:             [name], MD, Beryle Flock, MD Medicines:                General Anesthesia Complications:            No immediate complications. Estimated Blood Loss:     Estimated blood loss was minimal. Procedure:                Pre-Anesthesia Assessment:                           - Prior to the procedure, a History and Physical                            was performed, and patient medications and                            allergies were reviewed. The patient's tolerance of                            previous anesthesia was also reviewed. The risks                            and benefits of the procedure and the sedation                            options and risks were discussed with the patient.                            All questions were answered, and informed consent                            was obtained. Prior Anticoagulants: The patient has  taken no previous anticoagulant or antiplatelet                            agents. ASA Grade Assessment: III - A patient with                            severe systemic disease. After reviewing the risks                            and benefits, the patient was deemed in                            satisfactory condition to undergo the procedure.                            After obtaining informed consent, the endoscope was                            passed under direct vision. Throughout the                            procedure, the patient's blood pressure, pulse, and                            oxygen saturations were monitored continuously. The                            GIF-H190 ([id number]) Olympus endoscope was introduced                            through the mouth, and advanced to the antrum of                            the stomach. The PCF-HQ190L ([id number]) Olympus                            colonoscope was introduced through the mouth, and                            advanced to the fourth part of duodenum. The                            GF-[id number] ([id number]) Olympus linear ultrasound scope                            was introduced through the mouth, and advanced to                            the duodenum for ultrasound examination from the                            stomach and duodenum. The GF-UE190-AL5 ([id number])  Olympus radial ultrasound scope was introduced                            through the mouth, and advanced to the stomach for                            ultrasound examination. The upper EUS was extremely                            difficult due to unusual anatomy, an angulated and                            J-shaped stomach which made pyloric intubation                            difficult and significant looping. Successful                            completion of the procedure was aided by changing                            the patient's position to a left-lateral and                            extreme left-lateral at times, applying abdominal                            pressure and performing the maneuvers documented                            (below) in this report. The patient tolerated the                            procedure. Scope In: Scope Out: Findings:      ENDOSCOPIC FINDING: :      No  gross lesions were noted in the proximal esophagus and in the mid       esophagus.      A few small blebs were found in the distal esophagus.      The Z-line was irregular and was found 45 cm from the incisors.      A 2 cm hiatal hernia was present.      A J-shaped and angulation deformity was found in the gastric body. This       causes the adult endoscope to not pass/intubate the duodenum even with       repositioning (see prior EGD report by other endoscopist confirming this       as well). Transitioned to the pediatric colonoscope and this allowed [place]       to perform placement of a long 0.035 inch Soft Antonietta Breach was attempted and       passed successfully so that we could allow for scope passage with our       EUS scopes as well ERCP scope.      A moderate angulation deformity was found in the D1/D2 sweep.      A single 10 mm sessile polyp was  found in the second portion of the       duodenum (contralateral wall and just distal to the ampulla).       Preparations were made for mucosal resection. NBI imaging and       White-light endoscopy was done to demarcate the borders of the lesion.       Saline was injected to raise the lesion. Piecemeal mucosal resection       using a snare was performed. Resection and retrieval were complete. To       prevent bleeding after mucosal resection, two hemostatic clips were       successfully placed (MR conditional). There was no bleeding at the end       of the procedure.      A medium-sized polypoid and sessile lesion, consistent with adenoma,       with no bleeding was found at the ampulla (nearly 25-30 mm in size,       extending over a hidden fold and then inferiorly. Biopsies were taken       with a cold forceps for histology to rule out high-grade dysplasia.      ENDOSONOGRAPHIC FINDING: :      Visualization of the ampulla was very difficult, and I could not get the       radial EUS scope to get into the duodenum. Linear EUS scope did traverse        into the duodenum and was felt to not show any evidence of an intramural       (subepithelial) lesion, mass or wall thickening, though again, this is       not the ideal scope for this area (even used water insufflation to try       and aid echoendoscope placement - so it is possible subtle lesions could       have been missed).      Pancreatic parenchymal abnormalities were noted in the entire pancreas.       These consisted of lobularity without honeycombing and hyperechoic       strands.      The pancreatic duct had a normal endosonographic appearance in the       pancreatic head (2.0 mm), genu of the pancreas (1.0 mm), body of the       pancreas (1.6 mm) and tail of the pancreas ( 0.9 mm).      There was no sign of significant endosonographic abnormality in the       common bile duct (3 mm).      Multiple stones were visualized endosonographically in the gallbladder.      No malignant-appearing lymph nodes were visualized in the middle       paraesophageal mediastinum (level 3M), lower paraesophageal mediastinum       (level 8L), paracardial region (level 16), left gastric region (level       17) and celiac region (level 20).      An anechoic lesion suggestive of a cyst was identified in the       kidney/right. There was a single compartment.      Endosonographic imaging in the thoracic esophagus and in the       gastroesophageal junction showed no varices (the blebs noted on EGD       report are not varices).      The celiac region was visualized. Impression:               EGD Impression:                           -  No gross lesions in esophagus proximally. Blebs                            found in the distal esophagus (per EUS noted below                            these are not varices).                           - Z-line irregular, 45 cm from the incisors.                           - 2 cm hiatal hernia.                           - Acquired angulation/J-shaped deformities  of the                            gastric body leading to difficulty with duodenal                            intubation (requiring left-lateral and extreme-left                            lateral positioning and then transitioning to the                            pediatric colonoscope to allow placement).                           - Duodenal angulation deformity at D1/D2 sweep.                           - A single duodenal polyp in D2. Resected and                            retrieved. Clips (MR conditional) were placed.                           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                            Biopsied. This could be reached with great                            difficulty with the ERCP scope, but                            long-positioning, could not be achieved, which may  make attempt at resection more possible than just                            standard short/semi-long positioning (which was                            only possible).                           EUS Impression:                           - Ampulla visualization was difficult as noted                            above, but gross extension of the lesion deeper                            into the pancreas head or the biliary duct was not                            present and I could not visualize a mass either                            (but due to the deficits with positioning, things                            could have been missed).                           - Pancreatic parenchymal abnormalities consisting                            of lobularity and hyperechoic strands were noted in                            the entire pancreas.                           - The pancreatic duct had a normal endosonographic                            appearance in the pancreatic head, genu of the                             pancreas, body of the pancreas and tail of the                            pancreas.                           - There was no sign of significant pathology in the                            common bile duct.                           -  Multiple stones were visualized                            endosonographically in the gallbladder.                           - No malignant-appearing lymph nodes were                            visualized in the middle paraesophageal mediastinum                            (level 89M), lower paraesophageal mediastinum (level                            8L), paracardial region (level 16), left gastric                            region (level 17) and celiac region (level 20).                           - A cystic lesion was identified in the right                            kidney. Moderate Sedation:      Not Applicable - Patient had care per Anesthesia. Recommendation:           - The patient will be observed post-procedure,                            until all discharge criteria are met.                           - Discharge patient to home.                           - Patient has a contact number available for                            emergencies. The signs and symptoms of potential                            delayed complications were discussed with the                            patient. Return to normal activities tomorrow.                            Written discharge instructions were provided to the                            patient.                           - Resume previous diet.                           -  Observe patient's clinical course.                           - Await path results.                           - I think, based on the location, difficult                            positioning, size of this particular lesion, and                            the patient's overall comorbidities, at this point,                             I favor the patient having an evaluation and                            attempt at resection (if they also agree) to give                            patient best opportunity, this will not be an easy                            resection due to the noted issues above.                           - Surveillance of the likely other adenoma, can be                            considered based on what occurs with the patient's                            ampullary adenoma.                           - The findings and recommendations were discussed                            with the patient.                           - The findings and recommendations were discussed                            with the patient's family. Procedure Code(s):        --- Professional ---                           [phone], Esophagogastroduodenoscopy, flexible,                            transoral; with endoscopic mucosal resection  [phone], Esophagogastroduodenoscopy, flexible,                            transoral; with endoscopic ultrasound examination,                            including the esophagus, stomach, and either the                            duodenum or a surgically altered stomach where the                            jejunum is examined distal to the anastomosis Diagnosis Code(s):        --- Professional ---                           N28.89, Other specified disorders of kidney and                            ureter                           K22.8, Other specified diseases of esophagus                           K44.9, Diaphragmatic hernia without obstruction or                            gangrene                           K31.89, Other diseases of stomach and duodenum                           K31.7, Polyp of stomach and duodenum                           K86.9, Disease of pancreas, unspecified                           K80.20, Calculus of gallbladder without                             cholecystitis without obstruction                           I89.9, Noninfective disorder of lymphatic vessels                            and lymph nodes, unspecified                           K83.9, Disease of biliary tract, unspecified CPT copyright 2019 [employer]. All rights reserved. The codes documented in this report are preliminary and upon coder review may  be revised to meet current compliance requirements. Justice Britain, MD 12/24/2021 10:20:10 AM Number of Addenda: 0

## 2021-12-25 ENCOUNTER — Encounter (HOSPITAL_COMMUNITY): Payer: Self-pay | Admitting: Gastroenterology

## 2021-12-25 ENCOUNTER — Other Ambulatory Visit: Payer: Self-pay | Admitting: Student in an Organized Health Care Education/Training Program

## 2021-12-26 ENCOUNTER — Other Ambulatory Visit: Payer: Self-pay

## 2021-12-26 ENCOUNTER — Ambulatory Visit
Admission: RE | Admit: 2021-12-26 | Discharge: 2021-12-26 | Disposition: A | Discharge status: Home / Self Care | Payer: Medicare Other | Source: Ambulatory Visit | Attending: Vascular Surgery | Admitting: Vascular Surgery

## 2021-12-26 ENCOUNTER — Encounter: Payer: Self-pay | Admitting: Gastroenterology

## 2021-12-26 ENCOUNTER — Telehealth: Payer: Self-pay

## 2021-12-26 DIAGNOSIS — K8020 Calculus of gallbladder without cholecystitis without obstruction: Secondary | ICD-10-CM | POA: Diagnosis not present

## 2021-12-26 DIAGNOSIS — I714 Abdominal aortic aneurysm, without rupture, unspecified: Secondary | ICD-10-CM

## 2021-12-26 DIAGNOSIS — I723 Aneurysm of iliac artery: Secondary | ICD-10-CM | POA: Diagnosis not present

## 2021-12-26 DIAGNOSIS — N200 Calculus of kidney: Secondary | ICD-10-CM | POA: Diagnosis not present

## 2021-12-26 DIAGNOSIS — D135 Benign neoplasm of extrahepatic bile ducts: Secondary | ICD-10-CM

## 2021-12-26 LAB — SURGICAL PATHOLOGY

## 2021-12-26 MED ORDER — IOPAMIDOL (ISOVUE-370) INJECTION 76%
75.0000 mL | Freq: Once | INTRAVENOUS | Status: AC | PRN
  Administered 2021-12-26: 75 mL via INTRAVENOUS

## 2021-12-26 NOTE — Telephone Encounter (Signed)
ZENPEP [id number] units CPEP, refill request @ [id number] - [place], [place] - [address] AT [place] [address].

## 2021-12-26 NOTE — Telephone Encounter (Signed)
This Rx was sent to [facility] yesterday. Patient notified and is very Patent attorney.

## 2021-12-28 ENCOUNTER — Telehealth: Payer: Self-pay

## 2021-12-28 NOTE — Telephone Encounter (Signed)
Diane with [facility] called to report CT results and ensure they had populated in [place]. Informed Georgina Snell, PA of results since Dr Scot Dock is out of the office. She indicated that the pt should keep his upcoming 7/20 appt with Scot Dock to review CT results with pt.

## 2021-12-31 ENCOUNTER — Other Ambulatory Visit: Payer: Self-pay

## 2021-12-31 DIAGNOSIS — Z79891 Long term (current) use of opiate analgesic: Secondary | ICD-10-CM

## 2021-12-31 DIAGNOSIS — M25551 Pain in right hip: Secondary | ICD-10-CM

## 2021-12-31 MED ORDER — HYDROCODONE-ACETAMINOPHEN 7.5-325 MG PO TABS: 1.0000 | ORAL_TABLET | Freq: Two times a day (BID) | ORAL | 0 refills | Status: DC | PRN

## 2021-12-31 NOTE — Telephone Encounter (Signed)
Last rx written 11/27/21. Last OV 6/12 with Dr Humphrey Rolls. Next OV 02/11/22 with PCP. UDS 11/08/19.

## 2022-01-02 DIAGNOSIS — R3914 Feeling of incomplete bladder emptying: Secondary | ICD-10-CM | POA: Diagnosis not present

## 2022-01-03 ENCOUNTER — Encounter: Payer: Self-pay | Admitting: Vascular Surgery

## 2022-01-03 ENCOUNTER — Ambulatory Visit (INDEPENDENT_AMBULATORY_CARE_PROVIDER_SITE_OTHER): Payer: Medicare Other | Admitting: Vascular Surgery

## 2022-01-03 VITALS — BP 147/72 | HR 66 | Temp 97.7°F | Resp 20 | Ht 72.0 in | Wt 132.0 lb

## 2022-01-03 DIAGNOSIS — I7142 Juxtarenal abdominal aortic aneurysm, without rupture: Secondary | ICD-10-CM | POA: Diagnosis not present

## 2022-01-03 NOTE — Progress Notes (Signed)
REASON FOR VISIT:   Follow-up of abdominal aortic aneurysm.  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  Over the last year the morphology of his aneurysm is changed and he is no longer a candidate for endovascular repair.  His options would be open repair versus a fenestrated graft.  I think the risk of rupture for an aneurysm of this size is 10 to 15 %/year.  He is reluctant to consider any surgery and clearly surgery would be associated with significant risk given his multiple medical comorbidities including: Coronary artery disease, history of atrial fibrillation, a neoplasm of the ampulla of Vater, history of adenocarcinoma of the prostate, a history of thrombocytopenia, and severe protein calorie malnutrition (his BMI is 17.9).   Using the SVS risk calculator his risk of mortality with open repair would be 8%.  I explained that if he were to consider a fenestrated graft he would have to be referred to [facility].  After extensive discussion he does not wish to be referred to [facility] at this time.  He is not interested in open surgery.  He understands the risks of rupture.  We have again discussed the importance of tobacco cessation and he understands that continued tobacco use does increase his risk of aneurysm expansion and rupture.  I have ordered a follow-up CT scan in 6 months and I will see him back at that time.  As for his adenoma of the ampulla of Vater, if GI feels like this has to be resected that I do not think that the aneurysm itself would be a strict contraindication.  Clearly he is at high risk for anything given his multiple medical comorbidities as described above.  HPI:   [name] is a pleasant 76 y.o. male with a complicated situation.  He has an abdominal aortic aneurysm which we have been following.  When I saw him on 08/30/2020 his aneurysm was 5.2 cm in  maximum diameter and we were continuing to follow this.  A CT scan in May 2022 which was done but because of anemia and unintended weight loss demonstrated a mass in the head of the pancreas concerning for potential pancreatic neoplasm.  The aneurysm has not changed in size at that point.  I subsequently saw him in October 2022 and the aneurysm had enlarged to 5.6 cm in maximum diameter.  At that time he had a 15 mm neck.  He was actually scheduled for endovascular repair of his aneurysm but had been admitted to the hospital with "failure to thrive."  He was markedly debilitated and clearly not in any condition for surgery and his surgery was canceled.  He has also had problems with chronic bladder obstruction and currently has an indwelling Foley.  He also has an adenoma of the ampulla of Vater.  He was being considered for resection of this endoscopically but clearly is increased risk because of his multiple medical comorbidities.  He cannot tell me what specific symptoms he is having from this mass.  I think this was mostly an incidental finding on his CT scan.  The patient lives alone.  He does have family in [place] including his brother and sister.  He continues to smoke about a half a pack per day.  Past Medical History:  Diagnosis Date   AAA (abdominal aortic aneurysm) ([place])    followed by vasc surgery   Alcohol abuse  Barretts esophagus    bx distal esophagus 2011 neg    Bladder outlet obstruction 04/02/2018   CAD in native artery    a. s/p stent to prox RCA and mid Cx.   Cataract    Cholelithiasis    Chronic osteomyelitis involving ankle and foot, right ([place]) 07/13/2018   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   2006 - 3 adenomas largest 12 mm  2008 - no polyps 2013-  2 mm cecal polyp, adenoma, routine repeat colonoscopy 02/2017 approx 05/14/2017 18 polyps max 12 mm ALL adenomas recall 2019 [name], MD, [facility]      Dieulafoy lesion of duodenum    Diverticulosis of colon     DJD (degenerative joint disease)    left hip   Full dentures    upper and lower   GERD (gastroesophageal reflux disease)    diet controlled    Gout    feet   Hiatal hernia    2 cm noted on upper endos 2011    Hx of adenomatous colonic polyps    Hyperlipidemia    Hypertension    Internal hemorrhoid    Lung cancer ([place])    squamous cell (T2N0MX), 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 pt was on Plavix x 3 years then transitioned to ASA, no problems since   Non-small cell carcinoma of right lung, stage 1 ([place]) 01/30/2016   Renal failure, acute ([facility])    hx of 2/2 medication, resolved   Tobacco abuse    smoker .5 ppd x 55 yrs   UTI (lower urinary tract infection)     Family History  Problem Relation Age of Onset   Diabetes Mother    Hypertension Sister    Heart disease Sister    Diabetes Sister    Arthritis Brother    Gout Brother    Colon cancer Neg Hx    Esophageal cancer Neg Hx    Rectal cancer Neg Hx    Stomach cancer Neg Hx    Colon polyps Neg Hx    Inflammatory bowel disease Neg Hx    Liver disease Neg Hx    Pancreatic cancer Neg Hx     SOCIAL HISTORY: Social History   Tobacco Use   Smoking status: Every Day    Packs/day: 0.50    Years: 55.00    Total pack years: 27.50    Types: Cigarettes   Smokeless tobacco: Never   Tobacco comments:    .25 packs smoked daily ARJ 08/23/21  Substance Use Topics   Alcohol use: Not Currently    Allergies  Allergen Reactions   Candesartan Cilexetil-Hctz Other (See Comments)    Acute renal failure   Hydrochlorothiazide Other (See Comments)    Acute renal failure   Shellfish Allergy Other (See Comments)    Gout occurs    Current Outpatient Medications  Medication Sig Dispense Refill   acetaminophen (TYLENOL 8 HOUR) 650 MG CR tablet Take 1 tablet (650 mg total) by mouth every 8 (eight) hours as needed for pain or fever. 30 tablet 0   albuterol (VENTOLIN  HFA) 108 (90 Base) MCG/ACT inhaler INHALE 1-2 PUFFS BY MOUTH EVERY 6 HOURS AS NEEDED FOR WHEEZE OR SHORTNESS OF BREATH (Patient taking differently: Inhale 1-2 puffs into the lungs every 6 (six) hours as needed for wheezing or shortness of breath.) 6.7 each 5   amLODipine (NORVASC) 10 MG tablet Take 1 tablet (  10 mg total) by mouth daily. 90 tablet 3   escitalopram (LEXAPRO) 5 MG tablet Take 1 tablet (5 mg total) by mouth daily. 90 tablet 1   ferrous sulfate 325 (65 FE) MG tablet Take 325 mg by mouth daily.     HYDROcodone-acetaminophen (NORCO) 7.5-325 MG tablet Take 1 tablet by mouth 2 (two) times daily as needed for moderate pain. 60 tablet 0   metoprolol succinate (TOPROL-XL) 100 MG 24 hr tablet TAKE 1 TABLET EVERY DAY FOR HIGH BLOOD PRESSURE 90 tablet 3   mirtazapine (REMERON) 7.5 MG tablet Take 1 tablet (7.5 mg total) by mouth at bedtime. 90 tablet 1   pantoprazole (PROTONIX) 40 MG tablet TAKE 1 TABLET EVERY DAY FOR REFLUX 90 tablet 3   rosuvastatin (CRESTOR) 20 MG tablet TAKE 1 TABLET BY MOUTH EVERY DAY 90 tablet 1   senna (SENOKOT) 8.6 MG TABS tablet Take 2 tablets (17.2 mg total) by mouth in the morning and at bedtime. (Patient taking differently: Take 1 tablet by mouth 2 (two) times daily.) 120 tablet 1   tamsulosin (FLOMAX) 0.4 MG CAPS capsule Take 2 capsules (0.8 mg total) by mouth every evening. 180 capsule 3   vitamin C (ASCORBIC ACID) 500 MG tablet Take 500 mg by mouth daily.     ZENPEP 20000-63000 units CPEP TAKE 3 CAPSULES BY MOUTH 3 (THREE) TIMES DAILY BEFORE MEALS. 180 capsule 3   No current facility-administered medications for this visit.    REVIEW OF SYSTEMS:  [X]  denotes positive finding, [ ]  denotes negative finding Cardiac  Comments:  Chest pain or chest pressure: x   Shortness of breath upon exertion:    Short of breath when lying flat:    Irregular heart rhythm:        Vascular    Pain in calf, thigh, or hip brought on by ambulation:    Pain in feet at night that  wakes you up from your sleep:     Blood clot in your veins:    Leg swelling:         Pulmonary    Oxygen at home:    Productive cough:     Wheezing:         Neurologic    Sudden weakness in arms or legs:     Sudden numbness in arms or legs:     Sudden onset of difficulty speaking or slurred speech:    Temporary loss of vision in one eye:     Problems with dizziness:         Gastrointestinal    Blood in stool:     Vomited blood:         Genitourinary    Burning when urinating:     Blood in urine:        Psychiatric    Major depression:         Hematologic    Bleeding problems:    Problems with blood clotting too easily:        Skin    Rashes or ulcers:        Constitutional    Fever or chills:     PHYSICAL EXAM:   Vitals:   01/03/22 0928  BP: (!) 147/72  Pulse: 66  Resp: 20  Temp: 97.7 F (36.5 C)  SpO2: 92%  Weight: 132 lb (59.9 kg)  Height: 6' (1.829 m)   Body mass index is 17.9 kg/m.  GENERAL: The patient is a well-nourished male, in no acute distress.  The vital signs are documented above. CARDIAC: There is a regular rate and rhythm.  VASCULAR: I do not detect carotid bruits. He has palpable femoral pulses bilaterally. He has brisk Doppler signals in the dorsalis pedis and posterior tibial positions bilaterally. PULMONARY: There is good air exchange bilaterally without wheezing or rales. ABDOMEN: Soft and non-tender with normal pitched bowel sounds.  His aneurysm is palpable and nontender MUSCULOSKELETAL: There are no major deformities or cyanosis. NEUROLOGIC: No focal weakness or paresthesias are detected. SKIN: There are no ulcers or rashes noted. PSYCHIATRIC: The patient has a normal affect.  DATA:    CT ANGIO ABDOMEN PELVIS: I have reviewed the CT angio of the abdomen and pelvis that was done on 12/26/2021.  The aneurysm is 6.7 cm in maximum diameter and has thus not changed significantly from his most recent duplex scan on 11/01/2021.  The  morphology of the aneurysm however has changed and now the aneurysm is juxtarenal.  Thus I do not think he is a candidate for a standard endovascular repair at this point.  The patient has significant calcific disease and tortuosity in his iliac vessels especially on the left.    Deitra Mayo [facility] [phone]

## 2022-01-11 ENCOUNTER — Telehealth: Payer: Self-pay

## 2022-01-11 NOTE — Telephone Encounter (Signed)
#  180 [facility]. [facility], however [facility] are currently closed.

## 2022-01-11 NOTE — Telephone Encounter (Signed)
ZENPEP 20000-63000 units CPEP, refill request @ [id number] [place] [place] - [address] AT [place].

## 2022-01-11 NOTE — Telephone Encounter (Signed)
Per [facility]on[facility] at [facility], patient p/u this Rx on 7/12. Call placed to patient. Advised he has 3 refills and can call [facility] when he needs next fill. He was very Patent attorney.

## 2022-01-17 ENCOUNTER — Other Ambulatory Visit: Payer: Self-pay | Admitting: Student in an Organized Health Care Education/Training Program

## 2022-01-25 ENCOUNTER — Other Ambulatory Visit: Payer: Self-pay | Admitting: Student

## 2022-01-25 DIAGNOSIS — F32A Depression, unspecified: Secondary | ICD-10-CM

## 2022-01-28 MED ORDER — ESCITALOPRAM OXALATE 5 MG PO TABS: 5.0000 mg | ORAL_TABLET | Freq: Every day | ORAL | 1 refills | Status: DC

## 2022-01-28 NOTE — Addendum Note (Signed)
Addended by: Gilles Chiquito B on: 01/28/2022 09:13 AM   Modules accepted: Orders

## 2022-01-30 ENCOUNTER — Ambulatory Visit: Payer: Self-pay

## 2022-01-30 ENCOUNTER — Other Ambulatory Visit: Payer: Self-pay

## 2022-01-30 MED ORDER — TAMSULOSIN HCL 0.4 MG PO CAPS: 0.8000 mg | ORAL_CAPSULE | Freq: Every evening | ORAL | 3 refills | Status: DC

## 2022-01-30 NOTE — Patient Outreach (Signed)
  Care Coordination   01/30/2022 Name: [name] MRN: [id number] DOB: 03-28-1946   Care Coordination Outreach Attempts:  An unsuccessful telephone outreach was attempted today to offer the patient information about available care coordination services as a benefit of their health plan.   Follow Up Plan:  Additional outreach attempts will be made to offer the patient care coordination information and services.   Encounter Outcome:  No Answer  Care Coordination Interventions Activated:  No   Care Coordination Interventions:  No, not indicated    Johnney Killian, RN, BSN, CCM Care Management Coordinator [facility] Phone: [phone]: [phone]

## 2022-01-31 ENCOUNTER — Ambulatory Visit (HOSPITAL_COMMUNITY)
Admission: RE | Admit: 2022-01-31 | Discharge: 2022-01-31 | Disposition: A | Discharge status: Home / Self Care | Payer: Medicare Other | Source: Ambulatory Visit | Attending: Emergency Medicine | Admitting: Emergency Medicine

## 2022-01-31 DIAGNOSIS — Z85118 Personal history of other malignant neoplasm of bronchus and lung: Secondary | ICD-10-CM | POA: Diagnosis not present

## 2022-01-31 DIAGNOSIS — I700 Atherosclerosis of aorta: Secondary | ICD-10-CM | POA: Insufficient documentation

## 2022-01-31 DIAGNOSIS — J439 Emphysema, unspecified: Secondary | ICD-10-CM | POA: Insufficient documentation

## 2022-01-31 DIAGNOSIS — R634 Abnormal weight loss: Secondary | ICD-10-CM | POA: Insufficient documentation

## 2022-01-31 DIAGNOSIS — I77810 Thoracic aortic ectasia: Secondary | ICD-10-CM | POA: Insufficient documentation

## 2022-01-31 DIAGNOSIS — R9389 Abnormal findings on diagnostic imaging of other specified body structures: Secondary | ICD-10-CM | POA: Insufficient documentation

## 2022-01-31 DIAGNOSIS — I2510 Atherosclerotic heart disease of native coronary artery without angina pectoris: Secondary | ICD-10-CM | POA: Diagnosis not present

## 2022-01-31 DIAGNOSIS — R0602 Shortness of breath: Secondary | ICD-10-CM | POA: Diagnosis not present

## 2022-01-31 DIAGNOSIS — R911 Solitary pulmonary nodule: Secondary | ICD-10-CM | POA: Diagnosis not present

## 2022-01-31 DIAGNOSIS — F17200 Nicotine dependence, unspecified, uncomplicated: Secondary | ICD-10-CM | POA: Diagnosis not present

## 2022-02-01 ENCOUNTER — Telehealth: Payer: Self-pay | Admitting: Internal Medicine

## 2022-02-01 NOTE — Telephone Encounter (Signed)
[name],   please contact the patient and explained the following:  Vascular surgery (Dr. Doren Custard) has indicated that the patient's aneurysm problems pose a significant risk to his life.  Given that, Dr. Rush Landmark and I do not think it makes sense to attempt removal of this polyp from his intestine because of the potential complications and illness related to that.  Explain that we recommend we cancel his [facility] appointment and he can see me in the office on a nonurgent basis and I can review things in more detail.  In short it does not seem that the potential benefits of removing this polyp in the intestine are greater than the potential risks.

## 2022-02-04 ENCOUNTER — Other Ambulatory Visit: Payer: Self-pay | Admitting: Student in an Organized Health Care Education/Training Program

## 2022-02-04 DIAGNOSIS — Z79891 Long term (current) use of opiate analgesic: Secondary | ICD-10-CM

## 2022-02-04 DIAGNOSIS — M25551 Pain in right hip: Secondary | ICD-10-CM

## 2022-02-04 MED ORDER — HYDROCODONE-ACETAMINOPHEN 7.5-325 MG PO TABS
1.0000 | ORAL_TABLET | Freq: Two times a day (BID) | ORAL | 0 refills | Status: DC | PRN
Start: 2022-02-04 — End: 2022-02-11

## 2022-02-04 NOTE — Telephone Encounter (Signed)
Pt made aware of Dr [name] recommendations:  Pt was scheduled for an office visit on 04/03/2022 at 10:10 with Dr. [name]: Pt made aware: Pt was in agreement with canceling appointment  with [facility]: [place] appointment canceled for 03/04/2022:  Pt made aware: Pt verbalized understanding with all questions answered.

## 2022-02-04 NOTE — Telephone Encounter (Signed)
No [facility].  Cancelled couple appointments.  Next appointment 02/11/2022.  Next Appointment

## 2022-02-04 NOTE — Telephone Encounter (Signed)
Refill Request  HYDROcodone-acetaminophen (NORCO) 7.5-325 MG tablet  [id number] [place], [place] - [address] AT [place]

## 2022-02-05 ENCOUNTER — Encounter: Payer: Medicare Other | Admitting: Student

## 2022-02-06 DIAGNOSIS — R338 Other retention of urine: Secondary | ICD-10-CM | POA: Diagnosis not present

## 2022-02-07 ENCOUNTER — Telehealth: Payer: Self-pay

## 2022-02-08 MED ORDER — ZENPEP 20000-63000 UNITS PO CPEP: 3.0000 | ORAL_CAPSULE | Freq: Three times a day (TID) | ORAL | 3 refills | Status: DC

## 2022-02-08 NOTE — Telephone Encounter (Signed)
I refilled the Zenpep. Thanks.

## 2022-02-11 ENCOUNTER — Ambulatory Visit (INDEPENDENT_AMBULATORY_CARE_PROVIDER_SITE_OTHER): Payer: Medicare Other | Admitting: Student in an Organized Health Care Education/Training Program

## 2022-02-11 DIAGNOSIS — D135 Benign neoplasm of extrahepatic bile ducts: Secondary | ICD-10-CM

## 2022-02-11 DIAGNOSIS — I714 Abdominal aortic aneurysm, without rupture, unspecified: Secondary | ICD-10-CM

## 2022-02-11 DIAGNOSIS — F17210 Nicotine dependence, cigarettes, uncomplicated: Secondary | ICD-10-CM | POA: Diagnosis not present

## 2022-02-11 DIAGNOSIS — R630 Anorexia: Secondary | ICD-10-CM | POA: Diagnosis not present

## 2022-02-11 DIAGNOSIS — F17200 Nicotine dependence, unspecified, uncomplicated: Secondary | ICD-10-CM

## 2022-02-11 DIAGNOSIS — Z79891 Long term (current) use of opiate analgesic: Secondary | ICD-10-CM

## 2022-02-11 DIAGNOSIS — M25551 Pain in right hip: Secondary | ICD-10-CM

## 2022-02-11 MED ORDER — HYDROCODONE-ACETAMINOPHEN 7.5-325 MG PO TABS: 1.0000 | ORAL_TABLET | Freq: Two times a day (BID) | ORAL | 0 refills | Status: DC | PRN

## 2022-02-11 NOTE — Assessment & Plan Note (Signed)
Patient endorses that he has continues to smoke but has cut down. Today we discussed tobacco cessation, particularly in context of his unrepaired AAA. We will continue to follow and may discuss cessation aids at next visit.

## 2022-02-11 NOTE — Progress Notes (Signed)
Subjective:   Patient ID: AMDREW OBOYLE male   DOB: 15-Sep-1945 76 y.o.   MRN: [id number]  HPI: Mr.Angello L Hissong is a 76 y.o.  with the past medical history listed below who presents for follow up of weight loss and chronic conditions. Mr. Winokur reports he has had good appetite and has been working on increasing his caloric intake. Patient denies significant dyspnea and says he has a new inhaler that he uses 2x/week but does not know what it is, and he no longer uses his albuterol regularly. Patient reports regular bowel movements with Senna, denies constipation. He says his sister helps with some meals and he also cooks for himself. He reports overall good mood and is comforted by reading scripture. He reports that he is still smoking but has stopped drinking alcohol recently.   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[place]) 11/08/2020   Protein-calorie malnutrition, severe 11/07/2020   Dyspnea on exertion 02/14/2020   Urinary retention 04/02/2018   Mild alcohol use disorder 12/09/2016   Premature atrial contractions 07/31/2015   Chronic use of opiate for therapeutic purpose 05/01/2015   Atherosclerosis of aorta ([place]) 01/13/2015   Prediabetes 12/30/2014   AAA (abdominal aortic aneurysm) ([place])    Healthcare maintenance 06/30/2012   CAD (coronary artery disease) 03/17/2012   Erectile dysfunction 10/12/2010   Arthropathy of pelvic region and thigh 05/02/2008   CKD (chronic kidney disease) stage 2, GFR 60-89 ml/min 08/07/2007   Colon polyposis -  attenuated 04/18/2006   Gout 04/18/2006   Tobacco use disorder 04/18/2006   Essential hypertension 04/18/2006   GERD 04/18/2006     Current Outpatient Medications  Medication Sig Dispense Refill   acetaminophen (TYLENOL 8 HOUR) 650 MG CR tablet Take 1 tablet (650 mg total) by mouth every 8 (eight) hours as needed for pain or fever. 30 tablet 0   albuterol (VENTOLIN HFA) 108 (90 Base) MCG/ACT inhaler INHALE 1-2 PUFFS BY MOUTH EVERY 6 HOURS AS NEEDED FOR WHEEZE OR SHORTNESS OF BREATH (Patient taking differently: Inhale 1-2 puffs into the lungs every 6 (six) hours as needed for wheezing or shortness of breath.) 6.7 each 5   amLODipine (NORVASC) 10 MG tablet Take 1 tablet (10 mg total) by mouth daily. 90 tablet 3   escitalopram (LEXAPRO) 5 MG tablet Take 1 tablet (5 mg total) by mouth daily. 90 tablet 1   ferrous sulfate 325 (65 FE) MG tablet Take 325 mg by mouth daily.     HYDROcodone-acetaminophen (NORCO) 7.5-325 MG tablet Take 1 tablet by mouth 2 (two) times daily as needed for moderate pain. 60 tablet 0   metoprolol succinate (TOPROL-XL) 100 MG 24 hr tablet TAKE 1 TABLET EVERY DAY FOR HIGH BLOOD PRESSURE 90 tablet 3   mirtazapine (REMERON) 7.5 MG tablet TAKE 1 TABLET BY MOUTH AT BEDTIME. 90 tablet 1   Pancrelipase, Lip-Prot-Amyl, (ZENPEP) 20000-63000 units CPEP Take 3 capsules by mouth 3 (three) times daily before meals. 180 capsule 3   pantoprazole (PROTONIX) 40 MG tablet TAKE 1 TABLET EVERY DAY  FOR REFLUX 90 tablet 3   rosuvastatin (CRESTOR) 20 MG tablet TAKE 1 TABLET BY MOUTH EVERY DAY 90 tablet 1   senna (SENOKOT) 8.6 MG TABS tablet Take 2 tablets (17.2 mg total) by mouth in the morning and at bedtime. (Patient taking differently: Take 1 tablet by mouth 2 (two) times daily.) 120 tablet 1   tamsulosin (FLOMAX) 0.4 MG CAPS capsule Take 2 capsules (0.8 mg total) by mouth every evening. 180 capsule 3   vitamin C (ASCORBIC ACID) 500 MG tablet Take 500 mg by mouth daily.     No current  facility-administered medications for this visit.     Review of Systems: Pertinent items are noted in HPI.  Objective:   Physical Exam: Vitals:   02/11/22 0954  BP: (!) 110/59  Pulse: (!) 52  Temp: 98.1 F (36.7 C)  TempSrc: Oral  SpO2: 95%  Weight: 135 lb 1.6 oz (61.3 kg)  Height: 6' (1.829 m)   General: Thin-appearing, in no acute distress HEENT: No cervical lymphadenopathy.  CV: RRR, no murmurs, rubs or gallops.  Pulm: Normal effort, lungs CTA bilaterally Abdominal: Pulsatile abdominal mass on inspection and palpation Ext: Fingers show clubbing       Assessment & Plan:   AAA (abdominal aortic aneurysm) ([place]) Patient remains asymptomatic with AAA. Patient has been following with Vascular surgery and per their notes they plan to repeat CT in six months to reevaluate. Today we discussed continuing to monitor his nutritional status and reevaluating if he would be a better state of health for AAA repair in six months. If he is able to get a repair successfully, this may improve his status enough to undergo other procedures in the future.   Tobacco use disorder Patient endorses that he has continues to smoke but has cut down. Today we discussed tobacco cessation, particularly in context of his unrepaired AAA. We will continue to follow and may discuss cessation aids at next visit.   Anorexia Patient's weight today is stable at 135lb. Patient continues to take Mirtazapine.  At this time patient seems to be doing better from dietary standpoint and is able to cook for himself. Patient reports good appetite and been working on increasing intake with calorie-dense foods. Plan is to continue Mirtazepine 7.5mg  and will continue to monitor his weight at next visit.   Ampullary adenoma Patient underwent upper endoscopy and biopsy of adenoma six weeks ago. Biopsy was negative for high grade dysplasia or malignancy. Patient has been following with GI and per their notes, potentially  considering ECRP vs. surgical resection. We discussed how doing the aneurysm repair first may help reduce his risk for other procedures. Patient has been working on improving nutritional status and we will continue to monitor.

## 2022-02-11 NOTE — Progress Notes (Signed)
Attestation for Student Documentation:  I personally was present and performed or re-performed the history, physical exam and medical decision-making activities of this service and have verified that the service and findings are accurately documented in the student's note.  76 year old person with complicated comorbidities including chronic bladder outlet obstruction managed by Dr. Gloriann Loan, AAA managed by Dr. 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 Gessner and [place]. He is making some nice improvements over the last few months, his functional status at home is much improved, good exertional capacity, no falls, eating consistently, stopped using alcohol. No further hospitalizations or ED visits which is very positive. He has some good support from his sister. He continues using hydrocodone for chronic pain without issue, says it is helpful and improves his functioning at home. Reports good adherence with other medications. For his malnutrition, we talked about continuing remeron, zenpep, and protonix. For depression will continue with lexapro at low dose. I offered medication assisted treatment for tobacco use disorder which he declines for now. I am hopeful that with improving functional status over the coming months, he may become a better surgical candidate to have the AAA repaired.  Axel Filler, MD 02/11/2022, 3:16 PM

## 2022-02-11 NOTE — Assessment & Plan Note (Addendum)
Patient remains asymptomatic with AAA. Patient has been following with Vascular surgery and per their notes they plan to repeat CT in six months to reevaluate. Today we discussed continuing to monitor his nutritional status and reevaluating if he would be a better state of health for AAA repair in six months. If he is able to get a repair successfully, this may improve his status enough to undergo other procedures in the future.

## 2022-02-11 NOTE — Assessment & Plan Note (Addendum)
Patient underwent upper endoscopy and biopsy of adenoma six weeks ago. Biopsy was negative for high grade dysplasia or malignancy. Patient has been following with GI and per their notes, potentially considering ECRP vs. surgical resection. We discussed how doing the aneurysm repair first may help reduce his risk for other procedures. Patient has been working on improving nutritional status and we will continue to monitor.

## 2022-02-11 NOTE — Assessment & Plan Note (Signed)
Patient's weight today is stable at 135lb. Patient continues to take Mirtazapine.  At this time patient seems to be doing better from dietary standpoint and is able to cook for himself. Patient reports good appetite and been working on increasing intake with calorie-dense foods. Plan is to continue Mirtazepine 7.5mg  and will continue to monitor his weight at next visit.

## 2022-02-11 NOTE — Patient Instructions (Addendum)
Thank you for seeing [place] today [name]! Your blood pressure today was 110/59. Your weight has been stable at 135lb today. Today we discussed your overall health status and your weight.   We did not make any medication changes today. Please let us know if you have any trouble filling your prescriptions.   We do not have flu shots in the office today but we will contact you when they are available.

## 2022-02-12 ENCOUNTER — Telehealth: Payer: Self-pay | Admitting: *Deleted

## 2022-02-12 NOTE — Telephone Encounter (Signed)
Call from [facility].  Patient had a Quanta flow done for PVD.  Showed decreased circulation in both extremities. Patient is asymptomatic.   Are required to notify the patient's PCP of these findings.  PCP to order additional testing if indicated.  Nira Conn can be reached at [phone] fr questions.

## 2022-02-13 NOTE — Telephone Encounter (Signed)
Thanks for the information. Patient has known vascular disease, he is on medical management and is managed by vascular surgery as well. No further testing required at this time.

## 2022-02-25 ENCOUNTER — Other Ambulatory Visit: Payer: Self-pay

## 2022-02-25 MED ORDER — ZENPEP 20000-63000 UNITS PO CPEP: 3.0000 | ORAL_CAPSULE | Freq: Three times a day (TID) | ORAL | 3 refills | Status: DC

## 2022-02-25 NOTE — Telephone Encounter (Signed)
Called pt -requesting a refill on Zenpep. Rx sent 8/25 for Qty#180 but he takes 3 tabs TID. He stated he has about 4 days left in his bottle. Thanks

## 2022-02-25 NOTE — Telephone Encounter (Signed)
escitalopram (LEXAPRO) 5 MG tablet, REFILL REQUEST @ [id number] [place], [place] - [address] AT [place].

## 2022-03-05 ENCOUNTER — Ambulatory Visit (INDEPENDENT_AMBULATORY_CARE_PROVIDER_SITE_OTHER): Payer: Medicare Other | Admitting: Adult Health

## 2022-03-05 ENCOUNTER — Encounter: Payer: Self-pay | Admitting: Adult Health

## 2022-03-05 VITALS — BP 130/60 | HR 56 | Ht 72.0 in | Wt 131.0 lb

## 2022-03-05 DIAGNOSIS — F17200 Nicotine dependence, unspecified, uncomplicated: Secondary | ICD-10-CM

## 2022-03-05 DIAGNOSIS — R9389 Abnormal findings on diagnostic imaging of other specified body structures: Secondary | ICD-10-CM

## 2022-03-05 DIAGNOSIS — Z23 Encounter for immunization: Secondary | ICD-10-CM | POA: Diagnosis not present

## 2022-03-05 DIAGNOSIS — E43 Unspecified severe protein-calorie malnutrition: Secondary | ICD-10-CM

## 2022-03-05 DIAGNOSIS — J439 Emphysema, unspecified: Secondary | ICD-10-CM | POA: Insufficient documentation

## 2022-03-05 MED ORDER — ALBUTEROL SULFATE HFA 108 (90 BASE) MCG/ACT IN AERS
1.0000 | INHALATION_SPRAY | Freq: Four times a day (QID) | RESPIRATORY_TRACT | 2 refills | Status: DC | PRN
Start: 2022-03-05 — End: 2022-08-07

## 2022-03-05 NOTE — Progress Notes (Signed)
@Patient  ID: [name], male    DOB: 04-Mar-1946, 76 y.o.   MRN: [id number]  Chief Complaint  Patient presents with   Follow-up    Referring provider: Axel Filler  HPI: 76 year old male active smoker seen for pulmonary consult for abnormal CT chest August 23, 2021 with nodularity.  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  This showed the appearance of some anterior nodularity and protrusion at the right mainstem bronchus, possible soft tissue density in this region.  FOB -performed bronchoscopy on 07/27/2021 and inspection showed that this appeared to be protrusion of the airway medially into the right mainstem bronchus due to his surgical resection, stump site.   Endobronchial brushings and biopsies were performed that were negative for malignancy at the site.   PET scan Oct 16, 2021 negative for metastatic disease.  AAA at 7.0 x 5.8 cm  03/05/2022 Follow up : Abnormal CT chest , Emphysema  Patient presents for a 34-month follow-up.  Patient was seen earlier this year for abnormal CT chest.  CT showed anterior nodular GI along the right mainstem bronchus.  Patient underwent a bronchoscopy that showed protrusion of the airway medially and to the right mainstem bronchus due to previous surgical resection.  He has a history of lung cancer status post right upper lobectomy.  Endobronchial brushings and biopsies were negative for malignant cells.  Patient was set up for a follow-up CT chest.  CT chest done January 31, 2022 shows emphysema, status post right upper lobectomy, mild chronic scars at the lateral right lower lobe, no acute findings or suspicious nodules.  We reviewed his CT chest in detail.  Patient says overall he is doing okay.  Says he has no significant shortness of breath.  He gets winded if he has to do heavy activities.   He is somewhat sedentary.  He says he is able to do light house chores.  He walks with a cane.  He denies any flare of cough or wheezing. Continues to smoke, we discussed smoking cessation. He does not want to quit.  Patient has an albuterol inhaler but does not use it.  No previous PFTs are on file.   Allergies  Allergen Reactions   Candesartan Cilexetil-Hctz Other (See Comments)    Acute renal failure   Hydrochlorothiazide Other (See Comments)    Acute renal failure   Shellfish Allergy Other (See Comments)    Gout occurs    Immunization History  Administered Date(s) Administered   Fluad Quad(high Dose 65+) 05/15/2020, 03/05/2021   Influenza Split 03/14/2011, 06/30/2012   Influenza Whole 04/20/2009, 03/09/2010   Influenza,inj,Quad PF,6+ Mos 02/18/2013, 05/10/2014, 05/01/2015, 02/20/2016, 03/03/2017, 03/02/2018, 05/17/2019   PFIZER(Purple Top)SARS-COV-2 Vaccination 08/27/2019, 09/20/2019   Pneumococcal Conjugate-13 08/16/2014   Pneumococcal Polysaccharide-23 03/14/2011   Td 03/13/2009    Past Medical History:  Diagnosis Date   AAA (abdominal aortic aneurysm) ([place])    followed by vasc surgery   Alcohol abuse    Barretts esophagus    bx distal esophagus 2011 neg    Bladder outlet obstruction 04/02/2018   CAD in native artery    a. s/p stent to prox RCA and mid Cx.   Cataract    Cholelithiasis    Chronic osteomyelitis involving ankle and foot, right ([place]) 07/13/2018   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  2006 - 3 adenomas largest 12 mm  2008 - no polyps 2013-  2 mm cecal polyp, adenoma, routine repeat colonoscopy 02/2017 approx 05/14/2017 18 polyps max 12 mm ALL adenomas recall 2019 [name], [facility], [facility]      Dieulafoy lesion of duodenum    Diverticulosis of colon    DJD (degenerative joint disease)    left hip   Full dentures    upper and lower   GERD (gastroesophageal reflux disease)    diet controlled    Gout    feet   Hiatal hernia    2 cm noted on upper  endos 2011    Hx of adenomatous colonic polyps    Hyperlipidemia    Hypertension    Internal hemorrhoid    Lung cancer ([place])    squamous cell (T2N0MX), 11/07- surgically treated, no chemo/XRT   Myocardial infarction ([place]) 2004   hx of, 2 stents 2004. pt was on Plavix x 3 years then transitioned to ASA, no problems since   Non-small cell carcinoma of right lung, stage 1 ([place]) 01/30/2016   Renal failure, acute (HCC)    hx of 2/2 medication, resolved   Tobacco abuse    smoker .5 ppd x 55 yrs   UTI (lower urinary tract infection)     Tobacco History: Social History   Tobacco Use  Smoking Status Every Day   Packs/day: 0.50   Years: 55.00   Total pack years: 27.50   Types: Cigarettes  Smokeless Tobacco Never  Tobacco Comments   Smokes 5-6 packs of cigarettes a week. 03/05/2022 Tay   Ready to quit: No Counseling given: Yes Tobacco comments: Smokes 5-6 packs of cigarettes a week. 03/05/2022 Tay   Outpatient Medications Prior to Visit  Medication Sig Dispense Refill   acetaminophen (TYLENOL 8 HOUR) 650 MG CR tablet Take 1 tablet (650 mg total) by mouth every 8 (eight) hours as needed for pain or fever. 30 tablet 0   amLODipine (NORVASC) 10 MG tablet Take 1 tablet (10 mg total) by mouth daily. 90 tablet 3   escitalopram (LEXAPRO) 5 MG tablet Take 1 tablet (5 mg total) by mouth daily. 90 tablet 1   ferrous sulfate 325 (65 FE) MG tablet Take 325 mg by mouth daily.     HYDROcodone-acetaminophen (NORCO) 7.5-325 MG tablet Take 1 tablet by mouth 2 (two) times daily as needed for moderate pain. 60 tablet 0   metoprolol succinate (TOPROL-XL) 100 MG 24 hr tablet TAKE 1 TABLET EVERY DAY FOR HIGH BLOOD PRESSURE 90 tablet 3   mirtazapine (REMERON) 7.5 MG tablet TAKE 1 TABLET BY MOUTH AT BEDTIME. 90 tablet 1   Pancrelipase, Lip-Prot-Amyl, (ZENPEP) 20000-63000 units CPEP Take 3 capsules by mouth 3 (three) times daily before meals. 270 capsule 3   pantoprazole (PROTONIX) 40 MG tablet TAKE 1 TABLET  EVERY DAY FOR REFLUX 90 tablet 3   rosuvastatin (CRESTOR) 20 MG tablet TAKE 1 TABLET BY MOUTH EVERY DAY 90 tablet 1   senna (SENOKOT) 8.6 MG TABS tablet Take 2 tablets (17.2 mg total) by mouth in the morning and at bedtime. (Patient taking differently: Take 1 tablet by mouth 2 (two) times daily.) 120 tablet 1   tamsulosin (FLOMAX) 0.4 MG CAPS capsule Take 2 capsules (0.8 mg total) by mouth every evening. 180 capsule 3   vitamin C (ASCORBIC ACID) 500 MG tablet Take 500 mg by mouth daily.     albuterol (VENTOLIN HFA) 108 (90 Base) MCG/ACT inhaler INHALE  1-2 PUFFS BY MOUTH EVERY 6 HOURS AS NEEDED FOR WHEEZE OR SHORTNESS OF BREATH (Patient not taking: Reported on 03/05/2022) 6.7 each 5   No facility-administered medications prior to visit.     Review of Systems:   Constitutional:   No  weight loss, night sweats,  Fevers, chills,  +fatigue, or  lassitude.  HEENT:   No headaches,  Difficulty swallowing,  Tooth/dental problems, or  Sore throat,                No sneezing, itching, ear ache, nasal congestion, post nasal drip,   CV:  No chest pain,  Orthopnea, PND, swelling in lower extremities, anasarca, dizziness, palpitations, syncope.   GI  No heartburn, indigestion, abdominal pain, nausea, vomiting, diarrhea, change in bowel habits, loss of appetite, bloody stools.   Resp: No excess mucus, no productive cough,  No non-productive cough,  No coughing up of blood.  No change in color of mucus.  No wheezing.  No chest wall deformity  Skin: no rash or lesions.  GU: no dysuria, change in color of urine, no urgency or frequency.  No flank pain, no hematuria   MS:  No joint pain or swelling.  No decreased range of motion.  No back pain.    Physical Exam  BP 130/60 (BP Location: Left Arm)   Pulse (!) 56   Ht 6' (1.829 m)   Wt 131 lb (59.4 kg)   SpO2 98%   BMI 17.77 kg/m   GEN: A/Ox3; pleasant , NAD, thin and frail   HEENT:  [place]/AT,  EACs-clear, TMs-wnl, NOSE-clear, THROAT-clear, no  lesions, no postnasal drip or exudate noted.   NECK:  Supple w/ fair ROM; no JVD; normal carotid impulses w/o bruits; no thyromegaly or nodules palpated; no lymphadenopathy.    RESP  Clear  P & A; w/o, wheezes/ rales/ or rhonchi. no accessory muscle use, no dullness to percussion  CARD:  RRR, no m/r/g, no peripheral edema, pulses intact, no cyanosis or clubbing.  GI:   Soft & nt; nml bowel sounds; no organomegaly or masses detected.   Musco: Warm bil, no deformities or joint swelling noted.   Neuro: alert, no focal deficits noted.    Skin: Warm, no lesions or rashes    Lab Results:  CBC    BNP No results found for: "BNP"  ProBNP No results found for: "PROBNP"  Imaging: No results found.        No data to display          No results found for: "NITRICOXIDE"      Assessment & Plan:   Abnormal CT of the chest Abnormal CT chest with nodularity noted.  Follow-up serial CT August 2023 shows no suspicious nodules.   Protein-calorie malnutrition, severe Rec high-protein diet.  Tobacco use disorder Encouraged on smoking cessation.  Emphysema lung ([place]) Emphysema-patient encouraged on smoking cessation.  Patient has minimum symptom burden.  We will hold on PFTs at this time.  May use albuterol as needed.     Rexene Edison, NP 03/05/2022

## 2022-03-05 NOTE — Assessment & Plan Note (Signed)
Encouraged on smoking cessation.

## 2022-03-05 NOTE — Assessment & Plan Note (Addendum)
Rec high-protein diet.

## 2022-03-05 NOTE — Patient Instructions (Addendum)
Flu shot today.  Albuterol inhaler As needed   Activity as tolerated.  Work on not smoking.  High protein diet.  Follow up with Dr. Lamonte Sakai in 1 year and As needed

## 2022-03-05 NOTE — Assessment & Plan Note (Signed)
Emphysema-patient encouraged on smoking cessation.  Patient has minimum symptom burden.  We will hold on PFTs at this time.  May use albuterol as needed.

## 2022-03-05 NOTE — Assessment & Plan Note (Signed)
Abnormal CT chest with nodularity noted.  Follow-up serial CT August 2023 shows no suspicious nodules.

## 2022-03-06 DIAGNOSIS — R338 Other retention of urine: Secondary | ICD-10-CM | POA: Diagnosis not present

## 2022-03-07 ENCOUNTER — Other Ambulatory Visit: Payer: Self-pay

## 2022-03-07 DIAGNOSIS — Z79891 Long term (current) use of opiate analgesic: Secondary | ICD-10-CM

## 2022-03-07 DIAGNOSIS — M25552 Pain in left hip: Secondary | ICD-10-CM

## 2022-03-07 MED ORDER — HYDROCODONE-ACETAMINOPHEN 7.5-325 MG PO TABS: 1.0000 | ORAL_TABLET | Freq: Two times a day (BID) | ORAL | 0 refills | Status: DC | PRN

## 2022-03-07 NOTE — Telephone Encounter (Signed)
Last rx written 02/11/22. Last OV 02/11/22. Next OV 05/20/22. UDS  11/08/19.

## 2022-03-07 NOTE — Telephone Encounter (Signed)
HYDROcodone-acetaminophen (NORCO) 7.5-325 MG tablet, refill request @ [id number] - [place], [place] - [address].

## 2022-03-21 ENCOUNTER — Telehealth: Payer: Self-pay

## 2022-03-21 NOTE — Patient Outreach (Signed)
  Care Coordination   03/21/2022 Name: LORANCE PICKERAL MRN: [id number] DOB: 1945-09-17   Care Coordination Outreach Attempts:  A second unsuccessful outreach was attempted today to offer the patient with information about available care coordination services as a benefit of their health plan.     Follow Up Plan:  Additional outreach attempts will be made to offer the patient care coordination information and services.   Encounter Outcome:  No Answer  Care Coordination Interventions Activated:  No   Care Coordination Interventions:  No, not indicated    Johnney Killian, RN, BSN, CCM Care Management Coordinator [facility] Phone: [phone]: [phone]

## 2022-04-01 ENCOUNTER — Other Ambulatory Visit: Payer: Self-pay

## 2022-04-01 MED ORDER — ZENPEP 20000-63000 UNITS PO CPEP
3.0000 | ORAL_CAPSULE | Freq: Three times a day (TID) | ORAL | 3 refills | Status: DC
Start: 2022-04-01 — End: 2022-05-02

## 2022-04-01 MED ORDER — AMLODIPINE BESYLATE 10 MG PO TABS: 10.0000 mg | ORAL_TABLET | Freq: Every day | ORAL | 3 refills | Status: DC

## 2022-04-01 NOTE — Telephone Encounter (Signed)
Pancrelipase, Lip-Prot-Amyl, (ZENPEP) 20000-63000 units CPEP,  amLODipine (NORVASC) 10 MG tablet, refill request @  [id number] [place], [place] - [address] AT [place].

## 2022-04-03 ENCOUNTER — Encounter: Payer: Self-pay | Admitting: Internal Medicine

## 2022-04-03 ENCOUNTER — Other Ambulatory Visit (INDEPENDENT_AMBULATORY_CARE_PROVIDER_SITE_OTHER): Payer: Medicare Other

## 2022-04-03 ENCOUNTER — Telehealth (INDEPENDENT_AMBULATORY_CARE_PROVIDER_SITE_OTHER): Payer: Medicare Other | Admitting: Internal Medicine

## 2022-04-03 VITALS — BP 162/76 | HR 47 | Ht 72.0 in | Wt 139.0 lb

## 2022-04-03 DIAGNOSIS — D135 Benign neoplasm of extrahepatic bile ducts: Secondary | ICD-10-CM | POA: Diagnosis not present

## 2022-04-03 DIAGNOSIS — K635 Polyp of colon: Secondary | ICD-10-CM

## 2022-04-03 DIAGNOSIS — F17200 Nicotine dependence, unspecified, uncomplicated: Secondary | ICD-10-CM

## 2022-04-03 DIAGNOSIS — I7142 Juxtarenal abdominal aortic aneurysm, without rupture: Secondary | ICD-10-CM

## 2022-04-03 DIAGNOSIS — E43 Unspecified severe protein-calorie malnutrition: Secondary | ICD-10-CM

## 2022-04-03 DIAGNOSIS — L0232 Furuncle of buttock: Secondary | ICD-10-CM

## 2022-04-03 LAB — HEPATIC FUNCTION PANEL
ALT: 9 U/L (ref 0–53)
AST: 17 U/L (ref 0–37)
Albumin: 3.9 g/dL (ref 3.5–5.2)
Alkaline Phosphatase: 89 U/L (ref 39–117)
Bilirubin, Direct: 0.2 mg/dL (ref 0.0–0.3)
Total Bilirubin: 0.6 mg/dL (ref 0.2–1.2)
Total Protein: 7.7 g/dL (ref 6.0–8.3)

## 2022-04-03 NOTE — Patient Instructions (Signed)
Clean your boil daily with soap and water and cover with a bandage until it heals.  Your provider has requested that you go to the basement level for lab work before leaving today. Press "B" on the elevator. The lab is located at the first door on the left as you exit the elevator.  Due to recent changes in healthcare laws, you may see the results of your imaging and laboratory studies on MyChart before your provider has had a chance to review them.  We understand that in some cases there may be results that are confusing or concerning to you. Not all laboratory results come back in the same time frame and the provider may be waiting for multiple results in order to interpret others.  Please give [place] 48 hours in order for your provider to thoroughly review all the results before contacting the office for clarification of your results.   I appreciate the opportunity to care for you. Silvano Rusk, MD, [facility]

## 2022-04-03 NOTE — Progress Notes (Signed)
[name] 76 y.o. 1945/08/30 [id number]  Assessment & Plan:   Encounter Diagnoses  Name Primary?   Adenoma of ampulla of Vater Yes   Colon polyposis - attenuated    Juxtarenal abdominal aortic aneurysm (AAA) without rupture (HCC)    Smoker    Protein-calorie malnutrition, severe    Boil of buttock     Hold on any elective procedures given aneurysm He now says he would consider open surgery Other health issues still increase risks ut will ask Dr. Scot Dock if he wants to see him sooner If aneurysm is successfully repaired then would consider elective ampullary adenoma removal again  Orders Placed This Encounter  Procedures   Hepatic function panel    Counseled again re: stop smoking - he says he has cut down a little bit  Keep boil ulcer clean and bandage daily until healed  CC: Axel Filler, MD Dr. Deitra Mayo  Subjective:   Chief Complaint: Ampullary adenoma follow-up he also wants me to check site of a boil  HPI 76 year old African-American man with GI problems that include ampullary adenoma and suspected attenuated polyposis, and a misdiagnosis of Barrett's esophagus (no intestinal metaplasia x3) who presents for follow-up.  He has other significant problems that include an enlarging aortic aneurysm, alcoholism currently abstinent, coronary artery disease status post MI and stenting, remote lung cancer and malnutrition.  Dr. Rush Landmark attempted endoscopic resection of his ampullary adenoma in July but it was unsuccessful so the patient was referred to [facility].  However at follow-up with Dr. Scot Dock of vascular surgery a 10 to 15% annual risk of rupture of his aneurysm was noted and the patient was no longer an endovascular treatment candidate but did not wanted to pursue an open surgery.  The patient tells me now he is willing to consider having surgery for his aneurysm.  Because of these comorbidities I canceled his evaluation at [facility] for elective  removal of ampullary adenoma.  The patient denies any particular GI problems at this time.  He is "cutting back some" on smoking, his weight is up a little bit and he is eating better.  He had a boil on his right buttock that "the core came out of" and would like me to check it. Wt Readings from Last 3 Encounters:  04/03/22 139 lb (63 kg)  03/05/22 131 lb (59.4 kg)  02/11/22 135 lb 1.6 oz (61.3 kg)     Allergies  Allergen Reactions   Candesartan Cilexetil-Hctz Other (See Comments)    Acute renal failure   Hydrochlorothiazide Other (See Comments)    Acute renal failure   Shellfish Allergy Other (See Comments)    Gout occurs   Current Meds  Medication Sig   acetaminophen (TYLENOL 8 HOUR) 650 MG CR tablet Take 1 tablet (650 mg total) by mouth every 8 (eight) hours as needed for pain or fever.   albuterol (PROAIR HFA) 108 (90 Base) MCG/ACT inhaler Inhale 1-2 puffs into the lungs every 6 (six) hours as needed for wheezing or shortness of breath.   amLODipine (NORVASC) 10 MG tablet Take 1 tablet (10 mg total) by mouth daily.   escitalopram (LEXAPRO) 5 MG tablet Take 1 tablet (5 mg total) by mouth daily.   HYDROcodone-acetaminophen (NORCO) 7.5-325 MG tablet Take 1 tablet by mouth 2 (two) times daily as needed for moderate pain.   Iron Combinations (CHROMAGEN) capsule Take 1 capsule by mouth daily.   metoprolol succinate (TOPROL-XL) 100 MG 24 hr tablet TAKE 1 TABLET  EVERY DAY FOR HIGH BLOOD PRESSURE   mirtazapine (REMERON) 7.5 MG tablet TAKE 1 TABLET BY MOUTH AT BEDTIME.   Pancrelipase, Lip-Prot-Amyl, (ZENPEP) 20000-63000 units CPEP Take 3 capsules by mouth 3 (three) times daily before meals.   pantoprazole (PROTONIX) 40 MG tablet TAKE 1 TABLET EVERY DAY FOR REFLUX   rosuvastatin (CRESTOR) 20 MG tablet TAKE 1 TABLET BY MOUTH EVERY DAY   senna (SENOKOT) 8.6 MG TABS tablet Take 2 tablets (17.2 mg total) by mouth in the morning and at bedtime. (Patient taking differently: Take 1 tablet by mouth 2  (two) times daily.)   tamsulosin (FLOMAX) 0.4 MG CAPS capsule Take 2 capsules (0.8 mg total) by mouth every evening.   vitamin C (ASCORBIC ACID) 500 MG tablet Take 500 mg by mouth daily.   Past Medical History:  Diagnosis Date   AAA (abdominal aortic aneurysm) ([place])    followed by vasc surgery   Alcohol abuse    Barretts esophagus    bx distal esophagus 2011 neg    Bladder outlet obstruction 04/02/2018   CAD in native artery    a. s/p stent to prox RCA and mid Cx.   Cataract    Cholelithiasis    Chronic osteomyelitis involving ankle and foot, right ([place]) 07/13/2018   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   2006 - 3 adenomas largest 12 mm  2008 - no polyps 2013-  2 mm cecal polyp, adenoma, routine repeat colonoscopy 02/2017 approx 05/14/2017 18 polyps max 12 mm ALL adenomas recall 2019 [name], MD, [facility]      Dieulafoy lesion of duodenum    Diverticulosis of colon    DJD (degenerative joint disease)    left hip   Full dentures    upper and lower   GERD (gastroesophageal reflux disease)    diet controlled    Gout    feet   Hiatal hernia    2 cm noted on upper endos 2011    Hx of adenomatous colonic polyps    Hyperlipidemia    Hypertension    Internal hemorrhoid    Lung cancer ([place])    squamous cell (T2N0MX), 11/07- surgically treated, no chemo/XRT   Myocardial infarction ([place]) 2004   hx of, 2 stents 2004. pt was on Plavix x 3 years then transitioned to ASA, no problems since   Non-small cell carcinoma of right lung, stage 1 ([place]) 01/30/2016   Renal failure, acute (HCC)    hx of 2/2 medication, resolved   Tobacco abuse    smoker .5 ppd x 55 yrs   UTI (lower urinary tract infection)    Past Surgical History:  Procedure Laterality Date   BIOPSY  11/12/2020   Procedure: BIOPSY;  Surgeon: Doran Stabler, MD;  Location: [place];  Service: Gastroenterology;;   BIOPSY  07/25/2021   Procedure: BIOPSY;  Surgeon: Daryel November, MD;  Location: [place];   Service: Gastroenterology;;   BIOPSY  12/24/2021   Procedure: BIOPSY;  Surgeon: Irving Copas., MD;  Location: [facility];  Service: Gastroenterology;;   BRONCHIAL BIOPSY  07/27/2021   Procedure: BRONCHIAL BIOPSIES;  Surgeon: Collene Gobble, MD;  Location: [facility];  Service: Cardiopulmonary;;   BRONCHIAL BRUSHINGS  07/27/2021   Procedure: BRONCHIAL BRUSHINGS;  Surgeon: Collene Gobble, MD;  Location: [facility] ENDOSCOPY;  Service: Cardiopulmonary;;   cataract surgery Bilateral 2018   removal of cataracts   COLONOSCOPY     hx polyps - [name] 02/2012  COLONOSCOPY WITH PROPOFOL N/A 11/12/2020   Procedure: COLONOSCOPY WITH PROPOFOL;  Surgeon: Doran Stabler, MD;  Location: [place];  Service: Gastroenterology;  Laterality: N/A;   CORONARY ANGIOPLASTY WITH STENT PLACEMENT     ESOPHAGOGASTRODUODENOSCOPY (EGD) WITH PROPOFOL N/A 11/12/2020   Procedure: ESOPHAGOGASTRODUODENOSCOPY (EGD) WITH PROPOFOL;  Surgeon: Doran Stabler, MD;  Location: [place];  Service: Gastroenterology;  Laterality: N/A;   ESOPHAGOGASTRODUODENOSCOPY (EGD) WITH PROPOFOL N/A 03/06/2021   Procedure: ESOPHAGOGASTRODUODENOSCOPY (EGD) WITH PROPOFOL;  Surgeon: Yetta Flock, MD;  Location: [facility];  Service: Gastroenterology;  Laterality: N/A;   ESOPHAGOGASTRODUODENOSCOPY (EGD) WITH PROPOFOL N/A 07/25/2021   Procedure: ESOPHAGOGASTRODUODENOSCOPY (EGD) WITH PROPOFOL;  Surgeon: Daryel November, MD;  Location: [place];  Service: Gastroenterology;  Laterality: N/A;   ESOPHAGOGASTRODUODENOSCOPY (EGD) WITH PROPOFOL N/A 12/24/2021   Procedure: ESOPHAGOGASTRODUODENOSCOPY (EGD) WITH PROPOFOL;  Surgeon: Rush Landmark Telford Nab., MD;  Location: [facility];  Service: Gastroenterology;  Laterality: N/A;   flexible cystourethroscopy  05/24/06   HEMOSTASIS CLIP PLACEMENT  03/06/2021   Procedure: HEMOSTASIS CLIP PLACEMENT;  Surgeon: Yetta Flock, MD;  Location: [facility];  Service: Gastroenterology;;    HEMOSTASIS CLIP PLACEMENT  12/24/2021   Procedure: HEMOSTASIS CLIP PLACEMENT;  Surgeon: Irving Copas., MD;  Location: [facility];  Service: Gastroenterology;;   HOT HEMOSTASIS N/A 03/06/2021   Procedure: HOT HEMOSTASIS (ARGON PLASMA COAGULATION/BICAP);  Surgeon: Yetta Flock, MD;  Location: [facility];  Service: Gastroenterology;  Laterality: N/A;   INGUINAL HERNIA REPAIR  2005   LUNG LOBECTOMY Right 11/07   RUL   MULTIPLE TOOTH EXTRACTIONS     POLYPECTOMY  11/12/2020   Procedure: POLYPECTOMY;  Surgeon: Doran Stabler, MD;  Location: [place];  Service: Gastroenterology;;   POLYPECTOMY  12/24/2021   Procedure: POLYPECTOMY;  Surgeon: Irving Copas., MD;  Location: [facility];  Service: Gastroenterology;;   SUBMUCOSAL LIFTING INJECTION  12/24/2021   Procedure: SUBMUCOSAL LIFTING INJECTION;  Surgeon: Irving Copas., MD;  Location: [facility];  Service: Gastroenterology;;   TRANSURETHRAL RESECTION OF PROSTATE N/A 05/02/2021   Procedure: TRANSURETHRAL RESECTION OF THE PROSTATE (TURP);  Surgeon: Lucas Mallow, MD;  Location: [facility];  Service: Urology;  Laterality: N/A;   UPPER ESOPHAGEAL ENDOSCOPIC ULTRASOUND (EUS) N/A 12/24/2021   Procedure: UPPER ESOPHAGEAL ENDOSCOPIC ULTRASOUND (EUS);  Surgeon: Irving Copas., MD;  Location: [facility];  Service: Gastroenterology;  Laterality: N/A;   UPPER GASTROINTESTINAL ENDOSCOPY     VIDEO BRONCHOSCOPY  07/27/2021   Procedure: VIDEO BRONCHOSCOPY WITHOUT FLUORO;  Surgeon: Collene Gobble, MD;  Location: [facility];  Service: Cardiopulmonary;;   Social History   Tobacco Use   Smoking status: Every Day    Packs/day: 0.50    Years: 55.00    Total pack years: 27.50    Types: Cigarettes   Smokeless tobacco: Never   Tobacco comments:    Smokes 5-6 packs of cigarettes a week. 03/05/2022 Tay  Vaping Use   Vaping Use: Never used  Substance Use Topics   Alcohol use: Not Currently   Drug use:  No     family history includes Arthritis in his brother; Diabetes in his mother and sister; Gout in his brother; Heart disease in his sister; Hypertension in his sister.   Review of Systems As per HPI  Objective:   Physical Exam BP (!) 162/76   Pulse (!) 47   Ht 6' (1.829 m)   Wt 139 lb (63 kg)   BMI 18.85 kg/m  Thin bm  NAD Abd thin, soft NT w/ widened and prominent aortic impulse but no bruit  Right buttock with pencil head sized ulcer - sl induration around but not tender and no fluctuance or discharge

## 2022-04-04 ENCOUNTER — Other Ambulatory Visit: Payer: Self-pay

## 2022-04-04 DIAGNOSIS — M25552 Pain in left hip: Secondary | ICD-10-CM

## 2022-04-04 DIAGNOSIS — R338 Other retention of urine: Secondary | ICD-10-CM | POA: Diagnosis not present

## 2022-04-04 DIAGNOSIS — Z79891 Long term (current) use of opiate analgesic: Secondary | ICD-10-CM

## 2022-04-04 MED ORDER — HYDROCODONE-ACETAMINOPHEN 7.5-325 MG PO TABS: 1.0000 | ORAL_TABLET | Freq: Two times a day (BID) | ORAL | 0 refills | Status: DC | PRN

## 2022-04-04 NOTE — Telephone Encounter (Signed)
Last appointment 02/11/2022.  Next appointment 05/20/2022. No [employer] .

## 2022-04-15 ENCOUNTER — Other Ambulatory Visit: Payer: Self-pay | Admitting: Student in an Organized Health Care Education/Training Program

## 2022-04-15 ENCOUNTER — Ambulatory Visit: Payer: Medicare Other | Admitting: Nurse Practitioner

## 2022-04-15 MED ORDER — ROSUVASTATIN CALCIUM 20 MG PO TABS: 20.0000 mg | ORAL_TABLET | Freq: Every day | ORAL | 1 refills | Status: DC

## 2022-04-15 NOTE — Telephone Encounter (Signed)
Refill Request  rosuvastatin (CRESTOR) 20 MG tablet senna (SENOKOT) 8.6 MG TABS tablet  [id number] [place], [place] - [address] AT [place]

## 2022-04-15 NOTE — Telephone Encounter (Signed)
Next appt scheduled 12/4 with PCP.

## 2022-04-21 NOTE — Progress Notes (Unsigned)
Cardiology Office Note:    Date:  04/22/2022   ID:  [name], DOB 01/15/1946, MRN [id number]  PCP:  Axel Filler, MD   [place] Providers Cardiologist:  Freada Bergeron, MD     Referring MD: Axel Filler   CC: Here for follow up   History of Present Illness:    [name] is a 76 y.o. male with a hx of the following:  CAD, s/p RCA and Cx stents in 2004 AAA (followed by [facility]) Right common iliac artery aneurysm Aortic atheroscerlsis  HTN COPD CKD 2 Adeoma of ampulla of Vater Tobacco abuse Hx of lung cancer, s/p lobectomy Barrett's esophagus Chronic opioid abuse Alcohol abuse Prediabetes Colonic polyposis Bradycardia Questionable PAF, VT Osteomyeltitis Diverticulosis Hiatal hernia  Previous cardiovascular history of CAD with a remote NSTEMI in 2004, drug-eluting stent to proximal RCA and staged PCI to mid circumflex.  Moderate residual disease in left circumflex.  EF 60%.  In 2016 seen in follow-up with nonspecific IVCD as well as occasional PAC.  Subsequent EKGs during other admissions with degree of conduction abnormality with episodic bradycardia and 2020 EKG revealing right bundle branch block/LAFB pattern.  In May 2022 he was admitted with progressive weakness, weight loss, poor appetite, and constipation.  Hospital course was notable for sepsis due to Enterobacter materials bacteremia, possible pancreatic tumor, complicated cystitis.  Underwent MRI/MRCP which were not clear neoplastic but clinical scenario was concerning.  Hospitalization complicated for nonobstructing portal vein thrombosis, multiple aortic aneurysms and penetrating aortic ulcer, severe hepatic steatosis concerning for underlying cirrhosis, cholelithiasis, AKI and on CKD stage II, thrombocytopenia, hypotension, and acute hypoproliferative macrocytic anemia.  Bradycardia with heart rate in 30s to 40s, cardiology was consulted and D/C beta-blocker.  3-day  monitor after discharge was suggestive for A-fib.  Anticoagulation was deferred given weakness, poor anticoagulation candidate, and given recent admission.  Nuclear stress test was low risk without evidence of ischemia.  Last seen by Dr. Johney Frame in January 2023, was not doing well as he had recently presented to ER with painful urination after having indwelling catheter for inability to void.  Treated for cystitis and catheter was removed.  Noted slow stream while voiding.  Noted ongoing dark-colored stools, poor appetite, feeling fatigued.  He noted he had not followed up with GI since he was seen in the hospital in September 2022 for GI bleed.  Saw Dr. Curt Bears in March 2023 and was doing well from a cardiac perspective.  Anticoagulation was held off due to chronic medical problems.  Appear to be in idioventricular rhythm.  Today he presents for 36-month follow-up appointment.  He states he is getting better and doing well. Says he is getting stronger. Stays active by walking the grocery store and walking regularly. Also works around the house and cleans, around 1 hour into cleaning, gets a little short of breath with activity, chronic and stable over the past year. Denies any chest pain, palpitations, syncope, presyncope, dizziness, orthopnea, PND, swelling, bleeding, or claudication. Has cut down on his smoking since January and denies any alcohol use or illicit drug use. Tolerating current medication well.  Past Medical History:  Diagnosis Date   AAA (abdominal aortic aneurysm) ([place])    followed by vasc surgery   Alcohol abuse    Barretts esophagus    bx distal esophagus 2011 neg    Bladder outlet obstruction 04/02/2018   CAD in native artery    a. s/p stent to prox RCA and  mid Cx.   Cataract    Cholelithiasis    Chronic osteomyelitis involving ankle and foot, right ([place]) 07/13/2018   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   2006 - 3 adenomas largest 12 mm  2008 - no polyps 2013-  2 mm cecal  polyp, adenoma, routine repeat colonoscopy 02/2017 approx 05/14/2017 18 polyps max 12 mm ALL adenomas recall 2019 [name], MD, [facility]      Dieulafoy lesion of duodenum    Diverticulosis of colon    DJD (degenerative joint disease)    left hip   Full dentures    upper and lower   GERD (gastroesophageal reflux disease)    diet controlled    Gout    feet   Hiatal hernia    2 cm noted on upper endos 2011    Hx of adenomatous colonic polyps    Hyperlipidemia    Hypertension    Internal hemorrhoid    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 pt was on Plavix x 3 years then transitioned to ASA, no problems since   Non-small cell carcinoma of right lung, stage 1 ([place]) 01/30/2016   Renal failure, acute (HCC)    hx of 2/2 medication, resolved   Tobacco abuse    smoker .5 ppd x 55 yrs   UTI (lower urinary tract infection)     Past Surgical History:  Procedure Laterality Date   BIOPSY  11/12/2020   Procedure: BIOPSY;  Surgeon: Doran Stabler, MD;  Location: [place];  Service: Gastroenterology;;   BIOPSY  07/25/2021   Procedure: BIOPSY;  Surgeon: Daryel November, MD;  Location: [place];  Service: Gastroenterology;;   BIOPSY  12/24/2021   Procedure: BIOPSY;  Surgeon: Irving Copas., MD;  Location: [facility];  Service: Gastroenterology;;   BRONCHIAL BIOPSY  07/27/2021   Procedure: BRONCHIAL BIOPSIES;  Surgeon: Collene Gobble, MD;  Location: [place];  Service: Cardiopulmonary;;   BRONCHIAL BRUSHINGS  07/27/2021   Procedure: BRONCHIAL BRUSHINGS;  Surgeon: Collene Gobble, MD;  Location: [facility];  Service: Cardiopulmonary;;   cataract surgery Bilateral 2018   removal of cataracts   COLONOSCOPY     hx polyps - [name] 02/2012   COLONOSCOPY WITH PROPOFOL N/A 11/12/2020   Procedure: COLONOSCOPY WITH PROPOFOL;  Surgeon: Doran Stabler, MD;   Location: [place];  Service: Gastroenterology;  Laterality: N/A;   CORONARY ANGIOPLASTY WITH STENT PLACEMENT     ESOPHAGOGASTRODUODENOSCOPY (EGD) WITH PROPOFOL N/A 11/12/2020   Procedure: ESOPHAGOGASTRODUODENOSCOPY (EGD) WITH PROPOFOL;  Surgeon: Doran Stabler, MD;  Location: [place];  Service: Gastroenterology;  Laterality: N/A;   ESOPHAGOGASTRODUODENOSCOPY (EGD) WITH PROPOFOL N/A 03/06/2021   Procedure: ESOPHAGOGASTRODUODENOSCOPY (EGD) WITH PROPOFOL;  Surgeon: Yetta Flock, MD;  Location: [facility];  Service: Gastroenterology;  Laterality: N/A;   ESOPHAGOGASTRODUODENOSCOPY (EGD) WITH PROPOFOL N/A 07/25/2021   Procedure: ESOPHAGOGASTRODUODENOSCOPY (EGD) WITH PROPOFOL;  Surgeon: Daryel November, MD;  Location: [place];  Service: Gastroenterology;  Laterality: N/A;   ESOPHAGOGASTRODUODENOSCOPY (EGD) WITH PROPOFOL N/A 12/24/2021   Procedure: ESOPHAGOGASTRODUODENOSCOPY (EGD) WITH PROPOFOL;  Surgeon: Rush Landmark Telford Nab., MD;  Location: [facility];  Service: Gastroenterology;  Laterality: N/A;   flexible cystourethroscopy  05/24/06   HEMOSTASIS CLIP PLACEMENT  03/06/2021   Procedure: HEMOSTASIS CLIP PLACEMENT;  Surgeon: Yetta Flock, MD;  Location: [facility];  Service: Gastroenterology;;  HEMOSTASIS CLIP PLACEMENT  12/24/2021   Procedure: HEMOSTASIS CLIP PLACEMENT;  Surgeon: Irving Copas., MD;  Location: [facility];  Service: Gastroenterology;;   HOT HEMOSTASIS N/A 03/06/2021   Procedure: HOT HEMOSTASIS (ARGON PLASMA COAGULATION/BICAP);  Surgeon: Yetta Flock, MD;  Location: [facility];  Service: Gastroenterology;  Laterality: N/A;   INGUINAL HERNIA REPAIR  2005   LUNG LOBECTOMY Right 11/07   RUL   MULTIPLE TOOTH EXTRACTIONS     POLYPECTOMY  11/12/2020   Procedure: POLYPECTOMY;  Surgeon: Doran Stabler, MD;  Location: [place];  Service: Gastroenterology;;   POLYPECTOMY  12/24/2021   Procedure: POLYPECTOMY;  Surgeon:  Irving Copas., MD;  Location: [facility];  Service: Gastroenterology;;   SUBMUCOSAL LIFTING INJECTION  12/24/2021   Procedure: SUBMUCOSAL LIFTING INJECTION;  Surgeon: Irving Copas., MD;  Location: [facility];  Service: Gastroenterology;;   TRANSURETHRAL RESECTION OF PROSTATE N/A 05/02/2021   Procedure: TRANSURETHRAL RESECTION OF THE PROSTATE (TURP);  Surgeon: Lucas Mallow, MD;  Location: [facility];  Service: Urology;  Laterality: N/A;   UPPER ESOPHAGEAL ENDOSCOPIC ULTRASOUND (EUS) N/A 12/24/2021   Procedure: UPPER ESOPHAGEAL ENDOSCOPIC ULTRASOUND (EUS);  Surgeon: Irving Copas., MD;  Location: [facility];  Service: Gastroenterology;  Laterality: N/A;   UPPER GASTROINTESTINAL ENDOSCOPY     VIDEO BRONCHOSCOPY  07/27/2021   Procedure: VIDEO BRONCHOSCOPY WITHOUT FLUORO;  Surgeon: Collene Gobble, MD;  Location: [facility];  Service: Cardiopulmonary;;    Current Medications: Current Meds  Medication Sig   acetaminophen (TYLENOL 8 HOUR) 650 MG CR tablet Take 1 tablet (650 mg total) by mouth every 8 (eight) hours as needed for pain or fever.   albuterol (PROAIR HFA) 108 (90 Base) MCG/ACT inhaler Inhale 1-2 puffs into the lungs every 6 (six) hours as needed for wheezing or shortness of breath.   amLODipine (NORVASC) 10 MG tablet Take 1 tablet (10 mg total) by mouth daily.   escitalopram (LEXAPRO) 5 MG tablet Take 1 tablet (5 mg total) by mouth daily.   HYDROcodone-acetaminophen (NORCO) 7.5-325 MG tablet Take 1 tablet by mouth 2 (two) times daily as needed for moderate pain.   Iron Combinations (CHROMAGEN) capsule Take 1 capsule by mouth daily.   metoprolol succinate (TOPROL-XL) 100 MG 24 hr tablet Take 1 tablet (100 mg total) by mouth daily. Take with or immediately following a meal.   mirtazapine (REMERON) 7.5 MG tablet TAKE 1 TABLET BY MOUTH AT BEDTIME.   Pancrelipase, Lip-Prot-Amyl, (ZENPEP) 20000-63000 units CPEP Take 3 capsules by mouth 3 (three) times daily  before meals.   pantoprazole (PROTONIX) 40 MG tablet TAKE 1 TABLET EVERY DAY FOR REFLUX   rosuvastatin (CRESTOR) 40 MG tablet Take 1 tablet (40 mg total) by mouth daily.   senna (SENOKOT) 8.6 MG TABS tablet Take 2 tablets (17.2 mg total) by mouth in the morning and at bedtime.   tamsulosin (FLOMAX) 0.4 MG CAPS capsule Take 2 capsules (0.8 mg total) by mouth every evening.   vitamin C (ASCORBIC ACID) 500 MG tablet Take 500 mg by mouth daily.   [DISCONTINUED] metoprolol succinate (TOPROL-XL) 100 MG 24 hr tablet TAKE 1 TABLET EVERY DAY FOR HIGH BLOOD PRESSURE   rosuvastatin (CRESTOR) 20 MG tablet Take 1 tablet (20 mg total) by mouth daily.     Allergies:   Candesartan cilexetil-hctz, Hydrochlorothiazide, and Shellfish allergy   Social History   Socioeconomic History   Marital status: Single    Spouse name: Not on file   Number of children:  0   Years of education: Not on file   Highest education level: Not on file  Occupational History   Not on file  Tobacco Use   Smoking status: Every Day    Packs/day: 0.50    Years: 55.00    Total pack years: 27.50    Types: Cigarettes   Smokeless tobacco: Never   Tobacco comments:    Smokes 5-6 packs of cigarettes a week. 03/05/2022 [place]  Vaping Use   Vaping Use: Never used  Substance and Sexual Activity   Alcohol use: Not Currently   Drug use: No   Sexual activity: Not on file  Other Topics Concern   Not on file  Social History Narrative   Not on file   Social Determinants of Health   Financial Resource Strain: Not on file  Food Insecurity: Not on file  Transportation Needs: Not on file  Physical Activity: Not on file  Stress: Not on file  Social Connections: Not on file     Family History: The patient's family history includes Arthritis in his brother; Diabetes in his mother and sister; Gout in his brother; Heart disease in his sister; Hypertension in his sister. There is no history of Colon cancer, Esophageal cancer, Rectal  cancer, Stomach cancer, Colon polyps, Inflammatory bowel disease, Liver disease, or Pancreatic cancer.  ROS:   Review of Systems  Constitutional: Negative.   HENT: Negative.    Eyes: Negative.   Respiratory:  Positive for shortness of breath. Negative for cough, hemoptysis, sputum production and wheezing.        See HPI.   Cardiovascular: Negative.   Gastrointestinal: Negative.   Genitourinary:  Negative for dysuria, frequency, hematuria and urgency.       Has foley cath for chronic urinary retention - follows Urology.   Musculoskeletal: Negative.   Skin: Negative.   Neurological: Negative.   Endo/Heme/Allergies: Negative.   Psychiatric/Behavioral: Negative.      Please see the history of present illness.    All other systems reviewed and are negative.  EKGs/Labs/Other Studies Reviewed:    The following studies were reviewed today:   EKG:  EKG is not ordered today.   CT of Chest on 01/31/2022: 1. No acute findings within the chest. 2. No lung mass or nodule.  No evidence of malignancy. 3. Dense three-vessel coronary artery calcifications. 4. Dilated ascending thoracic aorta to 4.2 cm with aortic atherosclerosis. Recommend annual imaging followup by CTA or MRA. This recommendation follows 2010 ACCF/AHA/AATS/ACR/ASA/SCA/SCAI/SIR/STS/SVM Guidelines for the Diagnosis and Management of Patients with Thoracic Aortic Disease. Circulation. 2010; 121: Z610-R604. Aortic aneurysm NOS (ICD10-I71.9) 5. Emphysema.   Aortic Atherosclerosis (ICD10-I70.0) and Emphysema (ICD10-J43.9).    CT Angio of abdomen and pelvis on 12/26/2021: 1. Aortoiliac atherosclerosis with 6.7 cm infrarenal abdominal aortic aneurysm. AAA size of 7 cm is associated with increased risk of aneurysm rupture. Recommend referral to a vascular specialist. Reference: J Am Coll Radiol 5409;81:191-478. 2. 4.0 cm ascending thoracic aorta. Recommend annual imaging followup by CTA or MRA. This recommendation follows  2010 Guidelines for the Diagnosis and Management of Patients with Thoracic Aortic Disease. Circulation. 2010; 121: G956-O130. Aortic aneurysm NOS (ICD10-I71.9) 3. 2.2 cm RIGHT common iliac artery aneurysm. 4. Main pulmonary artery dilation, likely reflecting underlying pulmonary arterial hypertension.   1. No acute nonvascular abdominopelvic process. 2. LEFT basilar paraseptal lung change Aortic Atherosclerosis (ICD10-I70.0) and Emphysema (ICD10-J43.9), prostatomegaly and nonobstructing LEFT nephrolithiasis. Additional incidental, chronic and senescent findings as above.   AAA Duplex Limited on  11/01/21: Abdominal Aorta: There is evidence of abnormal dilatation of the mid and  distal Abdominal aorta. Previous diameter measurement was 6.1 x5.55 cm  obtained on 06/19/21 by CTA.  Nuclear Stress Test on 12/19/2020: Nuclear stress EF: 47%. The left ventricular ejection fraction is mildly decreased (45-54%). There was no ST segment deviation noted during stress. No T wave inversion was noted during stress. The study is normal. This is a low risk study.   1.  Reduced apical counts with normal wall motion consistent with apical thinning artifact.  No evidence of ischemia or infarction. 2.  Normal LVEF for this modality, 47%. 3.  This is a low risk study.    Monitor  on 11/30/2020: Patch wear time was 2 days and 4 hours Monitor shows probable Afib with some possible episodes of non-conducted PACs Monitor also comments on 27 episodes of VT, however, review of the strips suggests possible accelerated idoventricular rhythm as rate is on the lower end for VT No significant pauses.     Patch Wear Time:  2 days and 4 hours (2022-06-08T15:16:41-0400 to 2022-06-10T19:18:04-399)   27 Ventricular Tachycardia runs occurred, the run with the fastest interval lasting 17.7 secs with a max rate of 174 bpm, the longest lasting 3 mins 15 secs with an avg rate of 108 bpm. Atrial Fibrillation occurred  continuously (100% burden), ranging  from 37-160 bpm (avg of 81 bpm). Idioventricular Rhythm was present. Isolated VEs were rare (<1.0%, 1986), VE Couplets were rare (<1.0%, 256), and VE Triplets were rare (<1.0%, 79). Ventricular Trigeminy was present.    Echo complete on 11/06/2020:  1. Left ventricular ejection fraction, by estimation, is 55 to 60%. The  left ventricle has normal function. Left ventricular endocardial border  not optimally defined to evaluate regional wall motion. There is mild left  ventricular hypertrophy. Left  ventricular diastolic parameters are consistent with Grade I diastolic  dysfunction (impaired relaxation).   2. Right ventricular systolic function was not well visualized. The right  ventricular size is not well visualized. There is normal pulmonary artery  systolic pressure. The estimated right ventricular systolic pressure is  34.1 mmHg.   3. The mitral valve is normal in structure. No evidence of mitral valve  regurgitation. No evidence of mitral stenosis.   4. The aortic valve is tricuspid. Aortic valve regurgitation is not  visualized. No aortic stenosis is present.   5. The inferior vena cava is normal in size with greater than 50%  respiratory variability, suggesting right atrial pressure of 3 mmHg.   6. Technically difficult study with poor acoustic windows.    Recent Labs: 07/23/2021: Magnesium 2.3 07/24/2021: TSH 0.751 10/12/2021: Hemoglobin 10.9; Platelets 152.0 11/26/2021: BUN 13; Creatinine, Ser 1.14; Potassium 3.8; Sodium 145 04/03/2022: ALT 9  Recent Lipid Panel    Component Value Date/Time   CHOL 128 11/26/2021 1554   TRIG 60 11/26/2021 1554   HDL 44 11/26/2021 1554   CHOLHDL 2.9 11/26/2021 1554   CHOLHDL 3.7 11/11/2020 1555   VLDL 11 11/11/2020 1555   LDLCALC 71 11/26/2021 1554     Risk Assessment/Calculations:    CHA2DS2-VASc Score = 4  This indicates a 4.8% annual risk of stroke. The patient's score is based upon: CHF History:  0 HTN History: 1 Diabetes History: 0 Stroke History: 0 Vascular Disease History: 1 Age Score: 2 Gender Score: 0          Physical Exam:    VS:  BP 122/62   Pulse 74  Ht 6' (1.829 m)   Wt 136 lb 9.6 oz (62 kg)   SpO2 95%   BMI 18.53 kg/m     Wt Readings from Last 3 Encounters:  04/22/22 136 lb 9.6 oz (62 kg)  04/03/22 139 lb (63 kg)  03/05/22 131 lb (59.4 kg)     GEN: Thin, frail 76 y.o. African American male in NAD  HEENT: Normal NECK: No JVD; No carotid bruits CARDIAC: S1/S2, RRR, no murmurs, rubs, gallops; 2+ peripheral pulses throughout, strong and equal bilaterally RESPIRATORY:  Clear and diminished to auscultation without rales, wheezing or rhonchi  MUSCULOSKELETAL:  No edema; No deformity  SKIN: Thin skin, warm and dry NEUROLOGIC:  Alert and oriented x 3 PSYCHIATRIC:  Normal affect   ASSESSMENT:    1. Coronary artery disease involving native coronary artery of native heart without angina pectoris   2. PAD (peripheral artery disease) (HCC)   3. Abdominal aortic aneurysm (AAA) without rupture, unspecified part ([place])   4. Premature atrial contractions   5. Sinus bradycardia   6. Right bundle branch block (RBBB) with left anterior fascicular block (LAFB)   7. Anemia, unspecified type   8. History of GI bleed   9. Dark stools   10. Portal vein thrombosis   11. Failure to thrive in adult    PLAN:    In order of problems listed above:  CAD, s/p stenting Hx of CAD with a remote NSTEMI in 2004, drug-eluting stent to proximal RCA and staged PCI to mid circumflex.  Moderate residual disease in left circumflex.  TTE May 2022 revealed EF 55-60%. Myoview 2022 normal. Stable with no anginal symptoms. No indication for ischemic evaluation.  Not on aspirin due to history of GI bleed.  Increase Crestor to 40 mg daily and obtain FLP and LFT in 2 months.  Was on metoprolol succinate 100 mg daily as previously prescribed by PCP, however was previously avoided and  discontinued due to history of bradycardia. HR today in 70's. We will decrease Toprol-XL to 50 mg daily.  Rest the importance to let us know if he experiences any symptoms of low heart rate including syncope, presyncope, dizziness, confusion, or fatigue. Heart healthy diet and regular cardiovascular exercise encouraged.   PAD with aortic aneurysm CT scan 12/2021 revealed aortoiliac atherosclerosis with 6.7 cm infrarenal abdominal aortic aneurysm.  AAA size is 7 cm associate with increased risk of aneurysm rupture.  Continue to follow-up with vascular surgery.  Increase Crestor as mentioned above. Heart healthy diet and regular cardiovascular exercise encouraged.   PAC's, asymptomatic SB, known LAFB/RBBB Denies any slow heart rates or palpitations. HR today 70's on exam.  Cardiology was previously consulted during mission in 2022 for asymptomatic bradycardia.  He had similar tracings dating back to 2017 with no known RBBB/LAFB.  Nodal agents were avoided previously due to bradycardia.  However, PCP started Toprol XL 100 mg daily for BP control, denies any problems. HR stable. Will decrease Toprol XL as mentioned above. Discussed to monitor BP at home at least 2 hours after medications and sitting for 5-10 minutes. Care precautions and ED precautions discussed.  CHA2DS2-VASc score 4.  Continue to hold [facility] for now is poor candidate given history of GI bleed, frailty, and risk for falls.  Continue to follow-up with EP.   Anemia, Hx of GI bleed, dark stools Denies any recent GI bleed.  History of dark stools because he is on iron supplement.  Continue to follow-up with GI and PCP.  Continue  current medication regimen.   Nonobstructive portal vein thrombosis Continue to follow-up with vascular surgery.  Not on AC due to high risk for bleeding.  Failure to thrive Improvement in weight.  States he is feeling better compared to last year. Continue to follow with PCP. Heart healthy diet and regular cardiovascular  exercise encouraged.   7.  Disposition: Follow-up with Dr. Johney Frame in 3 to 4 months or sooner if anything changes.    Medication Adjustments/Labs and Tests Ordered: Current medicines are reviewed at length with the patient today.  Concerns regarding medicines are outlined above.  Orders Placed This Encounter  Procedures   Lipid panel   Hepatic function panel   Meds ordered this encounter  Medications   metoprolol succinate (TOPROL-XL) 50 MG 24 hr tablet    Sig: Take 1 tablet (50 mg total) by mouth daily. Take with or immediately following a meal.    Dispense:  90 tablet    Refill:  3   rosuvastatin (CRESTOR) 40 MG tablet    Sig: Take 1 tablet (40 mg total) by mouth daily.    Dispense:  90 tablet    Refill:  3    Patient Instructions  Medication Instructions:  Your physician has recommended you make the following change in your medication:   REDUCE the Toprol XL to 50 mg taking 1 daily   INCREASE the Rosuvastatin to 40 mg taking 1 daily   *If you need a refill on your cardiac medications before your next appointment, please call your pharmacy*   Lab Work: 2 MONTHS:  FASTING LIPID & LFT  If you have labs (blood work) drawn today and your tests are completely normal, you will receive your results only by: [place] (if you have MyChart) OR A paper copy in the mail If you have any lab test that is abnormal or we need to change your treatment, we will call you to review the results.   Testing/Procedures: None ordered   Follow-Up: At [facility], you and your health needs are our priority.  As part of our continuing mission to provide you with exceptional heart care, we have created designated Provider Care Teams.  These Care Teams include your primary Cardiologist (physician) and Advanced Practice Providers (APPs -  Physician Assistants and Nurse Practitioners) who all work together to provide you with the care you need, when you need it.  We recommend  signing up for the patient portal called "MyChart".  Sign up information is provided on this After Visit Summary.  MyChart is used to connect with patients for Virtual Visits (Telemedicine).  Patients are able to view lab/test results, encounter notes, upcoming appointments, etc.  Non-urgent messages can be sent to your provider as well.   To learn more about what you can do with MyChart, go to [email or web address].    Your next appointment:   3-4 month(s)  The format for your next appointment:   In Person  Provider:   Freada Bergeron, MD     Other Instructions   Important Information About Sugar         Signed, Finis Bud, NP  04/22/2022 1:38 PM    [place]

## 2022-04-22 ENCOUNTER — Ambulatory Visit: Payer: Medicare Other | Attending: Nurse Practitioner | Admitting: Nurse Practitioner

## 2022-04-22 ENCOUNTER — Encounter: Payer: Self-pay | Admitting: Physician Assistant

## 2022-04-22 VITALS — BP 122/62 | HR 74 | Ht 72.0 in | Wt 136.6 lb

## 2022-04-22 DIAGNOSIS — I81 Portal vein thrombosis: Secondary | ICD-10-CM | POA: Diagnosis not present

## 2022-04-22 DIAGNOSIS — R195 Other fecal abnormalities: Secondary | ICD-10-CM | POA: Diagnosis not present

## 2022-04-22 DIAGNOSIS — I452 Bifascicular block: Secondary | ICD-10-CM | POA: Diagnosis not present

## 2022-04-22 DIAGNOSIS — I491 Atrial premature depolarization: Secondary | ICD-10-CM

## 2022-04-22 DIAGNOSIS — R627 Adult failure to thrive: Secondary | ICD-10-CM

## 2022-04-22 DIAGNOSIS — I714 Abdominal aortic aneurysm, without rupture, unspecified: Secondary | ICD-10-CM | POA: Diagnosis not present

## 2022-04-22 DIAGNOSIS — I2510 Atherosclerotic heart disease of native coronary artery without angina pectoris: Secondary | ICD-10-CM | POA: Diagnosis not present

## 2022-04-22 DIAGNOSIS — R001 Bradycardia, unspecified: Secondary | ICD-10-CM | POA: Diagnosis not present

## 2022-04-22 DIAGNOSIS — I739 Peripheral vascular disease, unspecified: Secondary | ICD-10-CM

## 2022-04-22 DIAGNOSIS — Z8719 Personal history of other diseases of the digestive system: Secondary | ICD-10-CM

## 2022-04-22 DIAGNOSIS — D649 Anemia, unspecified: Secondary | ICD-10-CM

## 2022-04-22 MED ORDER — METOPROLOL SUCCINATE ER 50 MG PO TB24: 50.0000 mg | ORAL_TABLET | Freq: Every day | ORAL | 3 refills | Status: DC

## 2022-04-22 MED ORDER — ROSUVASTATIN CALCIUM 40 MG PO TABS: 40.0000 mg | ORAL_TABLET | Freq: Every day | ORAL | 3 refills | Status: DC

## 2022-04-22 NOTE — Patient Instructions (Signed)
Medication Instructions:  Your physician has recommended you make the following change in your medication:   REDUCE the Toprol XL to 50 mg taking 1 daily   INCREASE the Rosuvastatin to 40 mg taking 1 daily   *If you need a refill on your cardiac medications before your next appointment, please call your pharmacy*   Lab Work: 2 MONTHS:  FASTING LIPID & LFT  If you have labs (blood work) drawn today and your tests are completely normal, you will receive your results only by: [place] (if you have MyChart) OR A paper copy in the mail If you have any lab test that is abnormal or we need to change your treatment, we will call you to review the results.   Testing/Procedures: None ordered   Follow-Up: At [facility], you and your health needs are our priority.  As part of our continuing mission to provide you with exceptional heart care, we have created designated Provider Care Teams.  These Care Teams include your primary Cardiologist (physician) and Advanced Practice Providers (APPs -  Physician Assistants and Nurse Practitioners) who all work together to provide you with the care you need, when you need it.  We recommend signing up for the patient portal called "MyChart".  Sign up information is provided on this After Visit Summary.  MyChart is used to connect with patients for Virtual Visits (Telemedicine).  Patients are able to view lab/test results, encounter notes, upcoming appointments, etc.  Non-urgent messages can be sent to your provider as well.   To learn more about what you can do with MyChart, go to [email or web address].    Your next appointment:   3-4 month(s)  The format for your next appointment:   In Person  Provider:   Freada Bergeron, MD     Other Instructions   Important Information About Sugar

## 2022-05-02 ENCOUNTER — Other Ambulatory Visit: Payer: Self-pay

## 2022-05-02 MED ORDER — ZENPEP 20000-63000 UNITS PO CPEP
3.0000 | ORAL_CAPSULE | Freq: Three times a day (TID) | ORAL | 3 refills | Status: DC
Start: 2022-05-02 — End: 2022-07-08

## 2022-05-07 ENCOUNTER — Other Ambulatory Visit: Payer: Self-pay | Admitting: Student in an Organized Health Care Education/Training Program

## 2022-05-07 DIAGNOSIS — Z79891 Long term (current) use of opiate analgesic: Secondary | ICD-10-CM

## 2022-05-07 DIAGNOSIS — M25551 Pain in right hip: Secondary | ICD-10-CM

## 2022-05-07 MED ORDER — HYDROCODONE-ACETAMINOPHEN 7.5-325 MG PO TABS: 1.0000 | ORAL_TABLET | Freq: Two times a day (BID) | ORAL | 0 refills | Status: DC | PRN

## 2022-05-07 NOTE — Telephone Encounter (Signed)
HYDROcodone-acetaminophen (NORCO) 7.5-325 MG tablet   [id number] - [place], [place] - [place]

## 2022-05-20 ENCOUNTER — Encounter: Payer: Self-pay | Admitting: Student in an Organized Health Care Education/Training Program

## 2022-05-20 ENCOUNTER — Other Ambulatory Visit (HOSPITAL_COMMUNITY): Payer: Self-pay | Admitting: Adult Health

## 2022-05-20 ENCOUNTER — Ambulatory Visit (INDEPENDENT_AMBULATORY_CARE_PROVIDER_SITE_OTHER): Payer: Medicare Other | Admitting: Student in an Organized Health Care Education/Training Program

## 2022-05-20 VITALS — BP 126/61 | HR 85 | Temp 97.9°F | Ht 72.0 in | Wt 141.6 lb

## 2022-05-20 DIAGNOSIS — I7142 Juxtarenal abdominal aortic aneurysm, without rupture: Secondary | ICD-10-CM | POA: Diagnosis not present

## 2022-05-20 DIAGNOSIS — I10 Essential (primary) hypertension: Secondary | ICD-10-CM

## 2022-05-20 DIAGNOSIS — R339 Retention of urine, unspecified: Secondary | ICD-10-CM

## 2022-05-20 DIAGNOSIS — E43 Unspecified severe protein-calorie malnutrition: Secondary | ICD-10-CM

## 2022-05-20 DIAGNOSIS — D135 Benign neoplasm of extrahepatic bile ducts: Secondary | ICD-10-CM

## 2022-05-20 DIAGNOSIS — I480 Paroxysmal atrial fibrillation: Secondary | ICD-10-CM | POA: Diagnosis not present

## 2022-05-20 DIAGNOSIS — F17210 Nicotine dependence, cigarettes, uncomplicated: Secondary | ICD-10-CM

## 2022-05-20 DIAGNOSIS — F1010 Alcohol abuse, uncomplicated: Secondary | ICD-10-CM

## 2022-05-20 DIAGNOSIS — N312 Flaccid neuropathic bladder, not elsewhere classified: Secondary | ICD-10-CM

## 2022-05-20 DIAGNOSIS — F17200 Nicotine dependence, unspecified, uncomplicated: Secondary | ICD-10-CM

## 2022-05-20 DIAGNOSIS — Z79891 Long term (current) use of opiate analgesic: Secondary | ICD-10-CM

## 2022-05-20 NOTE — Assessment & Plan Note (Signed)
Chronic and stable.  Well-controlled currently.  Patient has not had a drink in almost 2 years.

## 2022-05-20 NOTE — Assessment & Plan Note (Signed)
Blood pressure well-controlled today on amlodipine 10 mg daily which we will continue.

## 2022-05-20 NOTE — Assessment & Plan Note (Signed)
Chronic and improving malnutrition which was multifactorial in etiology.  He is gained 6 pounds since I last saw him in August.  His weight currently is 141 pounds with a BMI of 19.2.  He is eating well throughout the day, excellent functional status, good adherence with supplemental vitamins and iron.  Plan will be to continue mirtazapine 7.5 mg in the evening, continue with pancreatic enzyme replacement for his issues with pancreatic insufficiency.  I will check his CMP, lipids, and iron level today.  Continue with Protonix and with multivitamin.  It is great seeing him put on some weight, and he is enjoying a good functional status and quality of life, much improved over the last 2 years.

## 2022-05-20 NOTE — Assessment & Plan Note (Signed)
6.7 cm expanding abdominal aortic aneurysm last imaged in July 2023.  Currently has no symptoms of the aneurysm, chronic and stable.  Will plan for repeat CT abdomen with contrast in January, this will be 35-month follow-up.  He will then follow-up with Dr. Scot Dock.  Will continue with metoprolol.  No aspirin given history of GI bleeding.  Blood pressure well-controlled today.  Continues to smoke about 3 cigarettes/day.

## 2022-05-20 NOTE — Assessment & Plan Note (Signed)
Being managed by Dr. [name] with GI.  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  Patient's nutrition status is improving.  He has had no recurrent signs of GI bleeding, has had some dark stools but likely related to oral iron supplementation.  I would recheck a CBC and iron levels today.  He will have follow-up with vascular surgery in January, repeat imaging.  Hopefully in the coming months we can find a plan to resolve both the abdominal aortic aneurysm and the ampullary adenoma.

## 2022-05-20 NOTE — Assessment & Plan Note (Signed)
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  Patient continues to find benefit with Norco 7.5 milligrams twice daily.  No issues with falls or confusion.  Would like to continue with medication which is reasonable.  Seems to be helping his quality of life.

## 2022-05-20 NOTE — Progress Notes (Signed)
Established Patient Office Visit  Subjective   Patient ID: [name], male    DOB: 11/14/1945  Age: 76 y.o. MRN: [id number]  Chief Complaint  Patient presents with   Follow-up    HPI  76 year old person here for follow-up of hypertension and malnutrition.  Patient has been doing much better the last 6 months that he was over the last 2 years.  He has had great improvements in his nutrition status.  He reports now eating at least 3 meals a day, feels like he is eating all the time.  He has been gaining back weight which is great to see.  Good adherence with his medications including pancreatic enzymes.  Good functional status, says he walks outside of the house every day.  Does his own grocery shopping, prepares his own meals, independent in all activities of daily living.  Walks with the assistance of a cane, but does not feel limited by dyspnea, no chest pain or pressure, no abdominal discomfort.  Having normal bowel movements, usually 1/day that is a mix between dark and normal brown.  No falls at home.  No recent ED visits or hospitalizations.  He has done a great job following up with many subspecialists.  Continues to have a urethral Foley catheter in place for urinary retention.    Objective:     BP 126/61 (BP Location: Right Arm, Patient Position: Sitting, Cuff Size: Small)   Pulse 85   Temp 97.9 F (36.6 C) (Oral)   Ht 6' (1.829 m)   Wt 141 lb 9.6 oz (64.2 kg)   SpO2 98%   BMI 19.20 kg/m    Physical Exam  Gen: well appearing man, no distress CV: RRR, no murmur Lungs: unlabored, clear throughout Abd: soft, non-tender, pulsating mass at the umbilicus Ext: Warm, well perfused, no edema Psych: normal affect, not depressed or anxious appearing.    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   Patient continues to find benefit with Norco 7.5 milligrams twice daily.  No issues with falls or confusion.  Would like to continue with medication which is reasonable.  Seems to be helping his quality of life.      Protein-calorie malnutrition, severe (Chronic)    Chronic and improving malnutrition which was multifactorial in etiology.  He is gained 6 pounds since I last saw him in August.  His weight currently is 141 pounds with a BMI of 19.2.  He is eating well throughout the day, excellent functional status, good adherence with supplemental vitamins and iron.  Plan will be to continue mirtazapine 7.5 mg in the evening, continue with pancreatic enzyme replacement for his issues with pancreatic insufficiency.  I will check his CMP, lipids, and iron level today.  Continue with Protonix and with multivitamin.  It is great seeing him put on some weight, and he is enjoying a good functional status and quality of life, much improved over the last 2 years.      Relevant Orders   CMP14 + Anion Gap   CBC no Diff   Lipid Profile   Iron and IBC ([id number])   Ferritin   Atrial fibrillation (HCC) (Chronic)    Chronic and stable.  No further symptoms of atrial fibrillation.  He was unable to tolerate anticoagulation last year,  had a hospitalization with GI bleeding after starting Xarelto.  Given issues with the abdominal aortic aneurysm and the Biliary adenoma, will continue to defer anticoagulation or chronic aspirin therapy.  Continue with metoprolol succinate 50 mg daily.  Patient is also been following up with cardiology clinic.      Ampullary adenoma (Chronic)    Being managed by Dr. [name] with GI.  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  Patient's nutrition status is improving.  He has had no recurrent signs of GI bleeding, has had some dark stools but likely related to  oral iron supplementation.  I would recheck a CBC and iron levels today.  He will have follow-up with vascular surgery in January, repeat imaging.  Hopefully in the coming months we can find a plan to resolve both the abdominal aortic aneurysm and the ampullary adenoma.      Tobacco use disorder (Chronic)     Medium    Essential hypertension - Primary (Chronic)    Blood pressure well-controlled today on amlodipine 10 mg daily which we will continue.      AAA (abdominal aortic aneurysm) (HCC) (Chronic)    6.7 cm expanding abdominal aortic aneurysm last imaged in July 2023.  Currently has no symptoms of the aneurysm, chronic and stable.  Will plan for repeat CT abdomen with contrast in January, this will be 19-month follow-up.  He will then follow-up with Dr. Scot Dock.  Will continue with metoprolol.  No aspirin given history of GI bleeding.  Blood pressure well-controlled today.  Continues to smoke about 3 cigarettes/day.      Relevant Orders   CT Angio Abd/Pel w/ and/or w/o   Mild alcohol use disorder (Chronic)    Chronic and stable.  Well-controlled currently.  Patient has not had a drink in almost 2 years.      Urinary retention (Chronic)    Chronic and stable.  Being managed by [facility].  Looks like he has not been able to have the Foley catheter removed, has had recurring issues with urinary retention.  Being planned now for suprapubic catheter placement.  I answered some of his questions about this and was supportive as it seems like this will improve his quality of life.       Return in about 3 months (around 08/19/2022).    Axel Filler, MD

## 2022-05-20 NOTE — Assessment & Plan Note (Signed)
Chronic and stable.  Being managed by [facility].  Looks like he has not been able to have the Foley catheter removed, has had recurring issues with urinary retention.  Being planned now for suprapubic catheter placement.  I answered some of his questions about this and was supportive as it seems like this will improve his quality of life.

## 2022-05-20 NOTE — Assessment & Plan Note (Signed)
Chronic and stable.  No further symptoms of atrial fibrillation.  He was unable to tolerate anticoagulation last year, had a hospitalization with GI bleeding after starting Xarelto.  Given issues with the abdominal aortic aneurysm and the Biliary adenoma, will continue to defer anticoagulation or chronic aspirin therapy.  Continue with metoprolol succinate 50 mg daily.  Patient is also been following up with cardiology clinic.

## 2022-05-21 ENCOUNTER — Encounter: Payer: Self-pay | Admitting: Student in an Organized Health Care Education/Training Program

## 2022-05-21 LAB — CMP14 + ANION GAP
ALT: 23 IU/L (ref 0–44)
AST: 37 IU/L (ref 0–40)
Albumin/Globulin Ratio: 1.2 (ref 1.2–2.2)
Albumin: 4.2 g/dL (ref 3.8–4.8)
Alkaline Phosphatase: 93 IU/L (ref 44–121)
Anion Gap: 14 mmol/L (ref 10.0–18.0)
BUN/Creatinine Ratio: 11 (ref 10–24)
BUN: 11 mg/dL (ref 8–27)
Bilirubin Total: 0.5 mg/dL (ref 0.0–1.2)
CO2: 22 mmol/L (ref 20–29)
Calcium: 9.5 mg/dL (ref 8.6–10.2)
Chloride: 104 mmol/L (ref 96–106)
Creatinine, Ser: 1.03 mg/dL (ref 0.76–1.27)
Globulin, Total: 3.4 g/dL (ref 1.5–4.5)
Glucose: 88 mg/dL (ref 70–99)
Potassium: 3.5 mmol/L (ref 3.5–5.2)
Sodium: 140 mmol/L (ref 134–144)
Total Protein: 7.6 g/dL (ref 6.0–8.5)
eGFR: 75 mL/min/{1.73_m2} (ref >59)

## 2022-05-21 LAB — CBC
Hematocrit: 37 % — ABNORMAL LOW (ref 37.5–51.0)
Hemoglobin: 11.9 g/dL — ABNORMAL LOW (ref 13.0–17.7)
MCH: 29.4 pg (ref 26.6–33.0)
MCHC: 32.2 g/dL (ref 31.5–35.7)
MCV: 91 fL (ref 79–97)
Platelets: 157 10*3/uL (ref 150–450)
RBC: 4.05 x10E6/uL — ABNORMAL LOW (ref 4.14–5.80)
RDW: 14.6 % (ref 11.6–15.4)
WBC: 6.4 10*3/uL (ref 3.4–10.8)

## 2022-05-21 LAB — FERRITIN: Ferritin: 165 ng/mL (ref 30–400)

## 2022-05-21 LAB — IRON AND TIBC
Iron Saturation: 19 % (ref 15–55)
Iron: 50 ug/dL (ref 38–169)
Total Iron Binding Capacity: 259 ug/dL (ref 250–450)
UIBC: 209 ug/dL (ref 111–343)

## 2022-05-21 LAB — LIPID PANEL
Chol/HDL Ratio: 3.2 ratio (ref 0.0–5.0)
Cholesterol, Total: 139 mg/dL (ref 100–199)
HDL: 44 mg/dL (ref >39)
LDL Chol Calc (NIH): 82 mg/dL (ref 0–99)
Triglycerides: 64 mg/dL (ref 0–149)
VLDL Cholesterol Cal: 13 mg/dL (ref 5–40)

## 2022-05-29 ENCOUNTER — Ambulatory Visit (HOSPITAL_COMMUNITY)
Admission: RE | Admit: 2022-05-29 | Discharge: 2022-05-29 | Disposition: A | Discharge status: Home / Self Care | Payer: Medicare Other | Source: Ambulatory Visit | Attending: Student in an Organized Health Care Education/Training Program | Admitting: Student in an Organized Health Care Education/Training Program

## 2022-05-29 ENCOUNTER — Other Ambulatory Visit: Payer: Self-pay | Admitting: Radiology

## 2022-05-29 DIAGNOSIS — I7143 Infrarenal abdominal aortic aneurysm, without rupture: Secondary | ICD-10-CM | POA: Diagnosis not present

## 2022-05-29 DIAGNOSIS — I7142 Juxtarenal abdominal aortic aneurysm, without rupture: Secondary | ICD-10-CM | POA: Diagnosis not present

## 2022-05-29 MED ORDER — IOHEXOL 350 MG/ML SOLN
75.0000 mL | Freq: Once | INTRAVENOUS | Status: AC | PRN
  Administered 2022-05-29: 75 mL via INTRAVENOUS

## 2022-05-30 ENCOUNTER — Other Ambulatory Visit: Payer: Self-pay | Admitting: Radiology

## 2022-05-30 NOTE — H&P (Signed)
Chief Complaint: Patient was seen in consultation today for areflexic bladder, urinary retention.  Referring Physician(s): Hollace Hayward  Supervising Physician: Sandi Mariscal  Patient Status: [facility] - Out-pt  History of Present Illness: [name] is a 76 y.o. male with a past medical history significant for chronic pain, malnutrition, gout, CAD s/p stenting, MI, a.fib, HTN, HLD, AAA, non-small cell lung cancer s/p surgical treatment, ampullary adenoma and areflexic bladder with urinary retention who presents today for suprapubic catheter placement.   Past Medical History:  Diagnosis Date   AAA (abdominal aortic aneurysm) ([place])    followed by vasc surgery   Alcohol abuse    Barretts esophagus    bx distal esophagus 2011 neg    Bladder outlet obstruction 04/02/2018   CAD in native artery    a. s/p stent to prox RCA and mid Cx.   Cataract    Cholelithiasis    Chronic osteomyelitis involving ankle and foot, right ([place]) 07/13/2018   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   2006 - 3 adenomas largest 12 mm  2008 - no polyps 2013-  2 mm cecal polyp, adenoma, routine repeat colonoscopy 02/2017 approx 05/14/2017 18 polyps max 12 mm ALL adenomas recall 2019 [name], MD, [facility]      Dieulafoy lesion of duodenum    Diverticulosis of colon    DJD (degenerative joint disease)    left hip   Full dentures    upper and lower   GERD (gastroesophageal reflux disease)    diet controlled    Gout    feet   Hiatal hernia    2 cm noted on upper endos 2011    Hx of adenomatous colonic polyps    Hyperlipidemia    Hypertension    Internal hemorrhoid    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 pt was on Plavix x 3 years then transitioned to ASA, no problems since   Non-small cell carcinoma of right lung, stage 1 ([place]) 01/30/2016   Renal failure, acute ([facility])    hx of 2/2  medication, resolved   Tobacco abuse    smoker .5 ppd x 55 yrs   UTI (lower urinary tract infection)     Past Surgical History:  Procedure Laterality Date   BIOPSY  11/12/2020   Procedure: BIOPSY;  Surgeon: Doran Stabler, MD;  Location: [place];  Service: Gastroenterology;;   BIOPSY  07/25/2021   Procedure: BIOPSY;  Surgeon: Daryel November, MD;  Location: [place];  Service: Gastroenterology;;   BIOPSY  12/24/2021   Procedure: BIOPSY;  Surgeon: Irving Copas., MD;  Location: [facility];  Service: Gastroenterology;;   BRONCHIAL BIOPSY  07/27/2021   Procedure: BRONCHIAL BIOPSIES;  Surgeon: Collene Gobble, MD;  Location: [place];  Service: Cardiopulmonary;;   BRONCHIAL BRUSHINGS  07/27/2021   Procedure: BRONCHIAL BRUSHINGS;  Surgeon: Collene Gobble, MD;  Location: [facility] ENDOSCOPY;  Service: Cardiopulmonary;;   cataract surgery Bilateral 2018   removal of cataracts   COLONOSCOPY     hx polyps - [name] 02/2012   COLONOSCOPY WITH PROPOFOL N/A 11/12/2020   Procedure: COLONOSCOPY WITH PROPOFOL;  Surgeon: Doran Stabler, MD;  Location: [place];  Service: Gastroenterology;  Laterality: N/A;   CORONARY ANGIOPLASTY WITH STENT PLACEMENT     ESOPHAGOGASTRODUODENOSCOPY (EGD) WITH PROPOFOL N/A 11/12/2020   Procedure: ESOPHAGOGASTRODUODENOSCOPY (EGD)  WITH PROPOFOL;  Surgeon: Doran Stabler, MD;  Location: [place];  Service: Gastroenterology;  Laterality: N/A;   ESOPHAGOGASTRODUODENOSCOPY (EGD) WITH PROPOFOL N/A 03/06/2021   Procedure: ESOPHAGOGASTRODUODENOSCOPY (EGD) WITH PROPOFOL;  Surgeon: Yetta Flock, MD;  Location: [facility];  Service: Gastroenterology;  Laterality: N/A;   ESOPHAGOGASTRODUODENOSCOPY (EGD) WITH PROPOFOL N/A 07/25/2021   Procedure: ESOPHAGOGASTRODUODENOSCOPY (EGD) WITH PROPOFOL;  Surgeon: Daryel November, MD;  Location: [place];  Service: Gastroenterology;  Laterality: N/A;   ESOPHAGOGASTRODUODENOSCOPY (EGD) WITH PROPOFOL  N/A 12/24/2021   Procedure: ESOPHAGOGASTRODUODENOSCOPY (EGD) WITH PROPOFOL;  Surgeon: Rush Landmark Telford Nab., MD;  Location: [facility];  Service: Gastroenterology;  Laterality: N/A;   flexible cystourethroscopy  05/24/06   HEMOSTASIS CLIP PLACEMENT  03/06/2021   Procedure: HEMOSTASIS CLIP PLACEMENT;  Surgeon: Yetta Flock, MD;  Location: [facility];  Service: Gastroenterology;;   HEMOSTASIS CLIP PLACEMENT  12/24/2021   Procedure: HEMOSTASIS CLIP PLACEMENT;  Surgeon: Irving Copas., MD;  Location: [facility];  Service: Gastroenterology;;   HOT HEMOSTASIS N/A 03/06/2021   Procedure: HOT HEMOSTASIS (ARGON PLASMA COAGULATION/BICAP);  Surgeon: Yetta Flock, MD;  Location: [facility];  Service: Gastroenterology;  Laterality: N/A;   INGUINAL HERNIA REPAIR  2005   LUNG LOBECTOMY Right 11/07   RUL   MULTIPLE TOOTH EXTRACTIONS     POLYPECTOMY  11/12/2020   Procedure: POLYPECTOMY;  Surgeon: Doran Stabler, MD;  Location: [place];  Service: Gastroenterology;;   POLYPECTOMY  12/24/2021   Procedure: POLYPECTOMY;  Surgeon: Irving Copas., MD;  Location: [facility];  Service: Gastroenterology;;   SUBMUCOSAL LIFTING INJECTION  12/24/2021   Procedure: SUBMUCOSAL LIFTING INJECTION;  Surgeon: Irving Copas., MD;  Location: [facility];  Service: Gastroenterology;;   TRANSURETHRAL RESECTION OF PROSTATE N/A 05/02/2021   Procedure: TRANSURETHRAL RESECTION OF THE PROSTATE (TURP);  Surgeon: Lucas Mallow, MD;  Location: [facility];  Service: Urology;  Laterality: N/A;   UPPER ESOPHAGEAL ENDOSCOPIC ULTRASOUND (EUS) N/A 12/24/2021   Procedure: UPPER ESOPHAGEAL ENDOSCOPIC ULTRASOUND (EUS);  Surgeon: Irving Copas., MD;  Location: [facility];  Service: Gastroenterology;  Laterality: N/A;   UPPER GASTROINTESTINAL ENDOSCOPY     VIDEO BRONCHOSCOPY  07/27/2021   Procedure: VIDEO BRONCHOSCOPY WITHOUT FLUORO;  Surgeon: Collene Gobble, MD;  Location: [facility]  ENDOSCOPY;  Service: Cardiopulmonary;;    Allergies: Candesartan cilexetil-hctz, Hydrochlorothiazide, and Shellfish allergy  Medications: Prior to Admission medications   Medication Sig Start Date End Date Taking? Authorizing Provider  acetaminophen (TYLENOL 8 HOUR) 650 MG CR tablet Take 1 tablet (650 mg total) by mouth every 8 (eight) hours as needed for pain or fever. 06/19/21   Varney Biles, MD  albuterol (PROAIR HFA) 108 (90 Base) MCG/ACT inhaler Inhale 1-2 puffs into the lungs every 6 (six) hours as needed for wheezing or shortness of breath. 03/05/22   Parrett, Fonnie Mu, NP  amLODipine (NORVASC) 10 MG tablet Take 1 tablet (10 mg total) by mouth daily. 04/01/22   Axel Filler, MD  escitalopram (LEXAPRO) 5 MG tablet Take 1 tablet (5 mg total) by mouth daily. 01/28/22 07/27/22  Sid Falcon, MD  HYDROcodone-acetaminophen (NORCO) 7.5-325 MG tablet Take 1 tablet by mouth 2 (two) times daily as needed for moderate pain. 05/07/22   Axel Filler, MD  Iron Combinations (CHROMAGEN) capsule Take 1 capsule by mouth daily.    [provider]  metoprolol succinate (TOPROL-XL) 50 MG 24 hr tablet Take 1 tablet (50 mg total) by mouth daily. Take with or immediately  following a meal. 04/22/22   Finis Bud, NP  mirtazapine (REMERON) 7.5 MG tablet TAKE 1 TABLET BY MOUTH AT BEDTIME. 01/18/22   Axel Filler, MD  Pancrelipase, Lip-Prot-Amyl, (ZENPEP) 20000-63000 units CPEP Take 3 capsules by mouth 3 (three) times daily before meals. 05/02/22   Axel Filler, MD  pantoprazole (PROTONIX) 40 MG tablet TAKE 1 TABLET EVERY DAY FOR REFLUX 12/25/21   Axel Filler, MD  rosuvastatin (CRESTOR) 40 MG tablet Take 1 tablet (40 mg total) by mouth daily. 04/22/22   Finis Bud, NP  senna (SENOKOT) 8.6 MG TABS tablet Take 2 tablets (17.2 mg total) by mouth in the morning and at bedtime. 06/29/21   Axel Filler, MD  tamsulosin (FLOMAX) 0.4 MG CAPS capsule  Take 2 capsules (0.8 mg total) by mouth every evening. 01/30/22   Charise Killian, MD  vitamin C (ASCORBIC ACID) 500 MG tablet Take 500 mg by mouth daily.    [provider]     Family History  Problem Relation Age of Onset   Diabetes Mother    Hypertension Sister    Heart disease Sister    Diabetes Sister    Arthritis Brother    Gout Brother    Colon cancer Neg Hx    Esophageal cancer Neg Hx    Rectal cancer Neg Hx    Stomach cancer Neg Hx    Colon polyps Neg Hx    Inflammatory bowel disease Neg Hx    Liver disease Neg Hx    Pancreatic cancer Neg Hx     Social History   Socioeconomic History   Marital status: Single    Spouse name: Not on file   Number of children: 0   Years of education: Not on file   Highest education level: Not on file  Occupational History   Not on file  Tobacco Use   Smoking status: Every Day    Packs/day: 0.50    Years: 55.00    Total pack years: 27.50    Types: Cigarettes   Smokeless tobacco: Never   Tobacco comments:    Smokes 5-6 packs of cigarettes a week. 03/05/2022 [place]  Vaping Use   Vaping Use: Never used  Substance and Sexual Activity   Alcohol use: Not Currently   Drug use: No   Sexual activity: Not on file  Other Topics Concern   Not on file  Social History Narrative   Not on file   Social Determinants of Health   Financial Resource Strain: Not on file  Food Insecurity: Not on file  Transportation Needs: Not on file  Physical Activity: Not on file  Stress: Not on file  Social Connections: Not on file     Review of Systems: A 12 point ROS discussed and pertinent positives are indicated in the HPI above.  All other systems are negative.  Review of Systems  Constitutional:  Positive for fatigue. Negative for chills and fever.  Respiratory:  Negative for cough and shortness of breath.   Cardiovascular:  Negative for chest pain.  Gastrointestinal:  Negative for abdominal pain, diarrhea, nausea and vomiting.   Musculoskeletal:  Positive for arthralgias (Left hip pain - chronic). Negative for back pain.  Neurological:  Negative for dizziness and headaches.    Vital Signs: BP 132/71   Pulse (!) 52   Temp 97.9 F (36.6 C) (Oral)   Resp 14   Ht 6' (1.829 m)   Wt 141 lb 9.6 oz (64.2 kg)  SpO2 97%   BMI 19.20 kg/m   Physical Exam Vitals reviewed.  Constitutional:      General: He is not in acute distress.    Comments: Chronically ill appearing  HENT:     Head: Normocephalic.     Mouth/Throat:     Mouth: Mucous membranes are moist.     Pharynx: Oropharynx is clear. No oropharyngeal exudate or posterior oropharyngeal erythema.  Cardiovascular:     Rate and Rhythm: Normal rate and regular rhythm.  Pulmonary:     Effort: Pulmonary effort is normal.     Breath sounds: Normal breath sounds.  Abdominal:     General: There is no distension.     Palpations: Abdomen is soft.     Tenderness: There is no abdominal tenderness.  Genitourinary:    Comments: (+) foley in place draining clear yellow urine Skin:    General: Skin is warm and dry.  Neurological:     Mental Status: He is alert and oriented to person, place, and time.  Psychiatric:        Mood and Affect: Mood normal.        Behavior: Behavior normal.        Thought Content: Thought content normal.        Judgment: Judgment normal.      MD Evaluation Airway: WNL Heart: WNL Abdomen: WNL Chest/ Lungs: WNL ASA  Classification: 3 Mallampati/Airway Score: Two   Imaging: CT Angio Abd/Pel w/ and/or w/o  Result Date: 05/29/2022 CLINICAL DATA:  76 year old male with history of abdominal aortic aneurysm. Surveillance study. EXAM: CT ANGIOGRAPHY ABDOMEN AND PELVIS WITH CONTRAST AND WITHOUT CONTRAST TECHNIQUE: Multidetector CT imaging of the abdomen and pelvis was performed using the standard protocol during bolus administration of intravenous contrast. Multiplanar reconstructed images and MIPs were obtained and reviewed to  evaluate the vascular anatomy. RADIATION DOSE REDUCTION: This exam was performed according to the departmental dose-optimization program which includes automated exposure control, adjustment of the mA and/or kV according to patient size and/or use of iterative reconstruction technique. CONTRAST:  6mL OMNIPAQUE IOHEXOL 350 MG/ML SOLN COMPARISON:  12/26/2021 FINDINGS: VASCULAR Aorta: Infrarenal fusiform abdominal aortic aneurysm measuring up to 79 mm in maximum short axis dimension, increased from 72 mm by my measurements (see key images). The suprarenal abdominal aorta is normal in caliber and patent with scattered atherosclerotic calcifications. Celiac: Patent without evidence of aneurysm, dissection, vasculitis or significant stenosis. SMA: Mild ostial stenosis secondary to atherosclerotic plaque. Patent distally. Renals: Single right and dual left renal arteries are patent without evidence of aneurysm, dissection, vasculitis, fibromuscular dysplasia or significant stenosis. Scattered atherosclerotic calcifications. IMA: Occluded proximally by thrombosed portion aneurysm, distal reconstitution Inflow: Poor opacification inflow vessels secondary to cardiac output contrast veins timing. 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 Tortuous internal and external iliac arteries bilaterally with scattered atherosclerotic calcifications. Proximal Outflow: Poorly opacified due to contrast phase with circumferential atherosclerotic calcifications evidence of occlusion. Veins: No obvious venous abnormality within the limitations of this arterial phase study. Review of the MIP images confirms the above findings. NON-VASCULAR Lower chest: Similar appearing emphysematous changes. Partially visualized biatrial cardiomegaly. Hepatobiliary: No focal liver abnormality is seen. No gallstones, gallbladder wall thickening, or biliary dilatation. Pancreas: Unremarkable. No pancreatic  ductal dilatation or surrounding inflammatory changes. Spleen: Normal in size without focal abnormality. Adrenals/Urinary Tract: Adrenal glands are unremarkable. Similar appearing multifocal bilateral simple renal cysts, the largest exophytic about the right upper pole measuring  up to approximately 5.5 cm. Kidneys are otherwise normal, without renal calculi, focal lesion, or hydronephrosis. Bladder is decompressed with Foley catheter in place. Stomach/Bowel: Stomach is within normal limits. Appendix is not definitively identified. No evidence of bowel wall thickening, distention, or inflammatory changes. Lymphatic: No abdominopelvic lymphadenopathy. Reproductive: The prostate measures approximately 5.6 cm in maximum axial dimension. Other: No abdominal wall hernia or abnormality. No abdominopelvic ascites. Musculoskeletal: No acute osseous abnormalities. Similar appearing multilevel degenerative changes of the visualized thoracolumbar spine. IMPRESSION: VASCULAR 1. Continued enlargement of previously visualized fusiform infrarenal abdominal aortic aneurysm now measuring up to 7.9 cm, previously 7.2 cm. Recommend referral to a vascular specialist if not already obtained. This recommendation follows ACR consensus guidelines: White Paper of the ACR Incidental Findings Committee II on Vascular Findings. J Am Coll Radiol 2013; 10:789-794. 2. Poor opacification of the inflow vessels due to contrast bolus timing in likely a degree of diminished cardiac output. 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 3.  Aortic Atherosclerosis (ICD10-I70.0). 4. Biatrial cardiomegaly. NON-VASCULAR 1. No acute abdominopelvic abnormality. 2.  Emphysema (ICD10-J43.9). 3. Prostatomegaly. Ruthann Cancer, MD [facility] Radiology Electronically Signed   By: Ruthann Cancer M.D.   On: 05/29/2022 11:03     Labs:  CBC: Recent Labs    07/29/21 0119 09/04/21 1124 10/12/21 0948 05/20/22 1007  WBC 3.0* 5.3 5.4 6.4  HGB 9.3* 10.4* 10.9* 11.9*  HCT 30.3* 32.7* 34.1* 37.0*  PLT 84* 196.0 152.0 157    COAGS: Recent Labs    07/23/21 1130 10/12/21 0948  INR 1.1 1.0  APTT 33  --     BMP: Recent Labs    07/27/21 0139 07/28/21 0209 07/29/21 0119 07/30/21 0715 09/04/21 1124 09/12/21 0954 10/12/21 0948 11/26/21 1554 05/20/22 1007  NA 135 137 139  --    < > 141 140 145* 140  K 3.6 3.2* 4.1  --    < > 3.3* 3.7 3.8 3.5  CL 98 100 103  --    < > 109 106 106 104  CO2 30 31 28   --    < > 24 27 23 22   GLUCOSE 85 89 125*  --    < > 87 93 89 88  BUN 21 23 20   --    < > 14 17 13 11   CALCIUM 9.1 9.1 9.0  --    < > 9.1 9.3 9.5 9.5  CREATININE 1.06 1.00 0.99 0.93   < > 1.00 1.04 1.14 1.03  GFRNONAA >60 >60 >60 >60  --   --   --   --   --    < > = values in this interval not displayed.    LIVER FUNCTION TESTS: Recent Labs    10/01/21 1425 10/12/21 0948 04/03/22 1037 05/20/22 1007  BILITOT 0.5 0.6 0.6 0.5  AST 85* 49* 17 37  ALT 63* 40 9 23  ALKPHOS 102 69 89 93  PROT 7.0 7.1 7.7 7.6  ALBUMIN 3.6 3.7 3.9 4.2    TUMOR MARKERS: No results for input(s): "AFPTM", "CEA", "CA199", "CHROMGRNA" in the last 8760 hours.  Assessment and Plan:  76 y/o M with history of areflexic bladder and urinary retention with chronic indwelling foley catheter who presents today for suprapubic catheter placement.  Risks and benefits discussed with the patient including bleeding, infection, damage to adjacent structures, perforation, and sepsis.  All of the patient's  questions were answered, patient is agreeable to proceed.  Consent signed and in chart.  Thank you for this interesting consult.  I greatly enjoyed meeting EMBRY HUSS and look forward to participating in their care.  A copy of this report was sent to the requesting provider on this date.  Electronically Signed: Joaquim Nam, PA-C 05/30/2022, 10:38 AM   I spent a total of 30 Minutes  in face to face in clinical consultation, greater than 50% of which was counseling/coordinating care for areflexic bladder, urinary retention.

## 2022-05-31 ENCOUNTER — Other Ambulatory Visit (HOSPITAL_COMMUNITY): Payer: Self-pay | Admitting: Interventional Radiology

## 2022-05-31 ENCOUNTER — Ambulatory Visit (HOSPITAL_COMMUNITY)
Admission: RE | Admit: 2022-05-31 | Discharge: 2022-05-31 | Disposition: A | Discharge status: Home / Self Care | Payer: Medicare Other | Source: Ambulatory Visit | Attending: Adult Health | Admitting: Adult Health

## 2022-05-31 ENCOUNTER — Other Ambulatory Visit: Payer: Self-pay

## 2022-05-31 DIAGNOSIS — R339 Retention of urine, unspecified: Secondary | ICD-10-CM

## 2022-05-31 DIAGNOSIS — N312 Flaccid neuropathic bladder, not elsewhere classified: Secondary | ICD-10-CM | POA: Diagnosis present

## 2022-05-31 DIAGNOSIS — N139 Obstructive and reflux uropathy, unspecified: Secondary | ICD-10-CM | POA: Diagnosis not present

## 2022-05-31 DIAGNOSIS — N319 Neuromuscular dysfunction of bladder, unspecified: Secondary | ICD-10-CM | POA: Diagnosis not present

## 2022-05-31 DIAGNOSIS — Z9359 Other cystostomy status: Secondary | ICD-10-CM

## 2022-05-31 MED ORDER — FENTANYL CITRATE (PF) 100 MCG/2ML IJ SOLN
INTRAMUSCULAR | Status: AC
  Filled 2022-05-31: qty 2

## 2022-05-31 MED ORDER — MIDAZOLAM HCL 2 MG/2ML IJ SOLN
INTRAMUSCULAR | Status: AC
  Filled 2022-05-31: qty 2

## 2022-05-31 MED ORDER — FENTANYL CITRATE (PF) 100 MCG/2ML IJ SOLN
INTRAMUSCULAR | Status: AC | PRN
  Administered 2022-05-31 (×2): 25 ug via INTRAVENOUS
  Administered 2022-05-31: 50 ug via INTRAVENOUS

## 2022-05-31 MED ORDER — SODIUM CHLORIDE 0.9 % IV SOLN: INTRAVENOUS | Status: DC

## 2022-05-31 MED ORDER — LIDOCAINE-EPINEPHRINE 1 %-1:100000 IJ SOLN: 10.0000 mL | Freq: Once | INTRAMUSCULAR | Status: DC

## 2022-05-31 MED ORDER — MIDAZOLAM HCL 2 MG/2ML IJ SOLN
INTRAMUSCULAR | Status: AC | PRN
  Administered 2022-05-31 (×2): 1 mg via INTRAVENOUS

## 2022-05-31 NOTE — Procedures (Signed)
Pre procedural Dx: Urinary retention Post procedural Dx: Same  Technically successful CT guided placed of a 14 Fr drainage catheter placement into the urinary bladder yielding clear urine.   Suprapubic catheter connected to gravity bag.  EBL: Trace Complications: None immediate  Ronny Bacon, MD Pager #: [phone]

## 2022-06-04 DIAGNOSIS — R338 Other retention of urine: Secondary | ICD-10-CM | POA: Diagnosis not present

## 2022-06-06 ENCOUNTER — Telehealth: Payer: Self-pay | Admitting: Student in an Organized Health Care Education/Training Program

## 2022-06-06 ENCOUNTER — Telehealth: Payer: Self-pay

## 2022-06-06 DIAGNOSIS — Z79891 Long term (current) use of opiate analgesic: Secondary | ICD-10-CM

## 2022-06-06 DIAGNOSIS — M25551 Pain in right hip: Secondary | ICD-10-CM

## 2022-06-06 MED ORDER — HYDROCODONE-ACETAMINOPHEN 7.5-325 MG PO TABS: 1.0000 | ORAL_TABLET | Freq: Two times a day (BID) | ORAL | 0 refills | Status: DC | PRN

## 2022-06-06 NOTE — Telephone Encounter (Signed)
Refill Request Pancrelipase, Lip-Prot-Amyl, (ZENPEP) 20000-63000 units CPEP   HYDROcodone-acetaminophen (NORCO) 7.5-325 MG tablet   [id number] - [place], [place] - [address] AT [place]

## 2022-06-06 NOTE — Telephone Encounter (Signed)
Norco refilled. Thank you.

## 2022-06-06 NOTE — Telephone Encounter (Signed)
Zenpep #270 with 3 refills sent to [facility] on 05/02/22. Patient notified to call [facility] to get this ready.

## 2022-06-06 NOTE — Patient Outreach (Signed)
  Care Coordination   06/06/2022 Name: [name] MRN: [id number] DOB: 1945-11-30   Care Coordination Outreach Attempts:  A third unsuccessful outreach was attempted today to offer the patient with information about available care coordination services as a benefit of their health plan.   Follow Up Plan:  Additional outreach attempts will be made to offer the patient care coordination information and services.   Encounter Outcome:  No Answer   Care Coordination Interventions:  No, not indicated    Jone Baseman, RN, MSN Between Management Care Management Coordinator Direct Line [phone]

## 2022-06-13 ENCOUNTER — Encounter: Payer: Self-pay | Admitting: Vascular Surgery

## 2022-06-13 ENCOUNTER — Ambulatory Visit (INDEPENDENT_AMBULATORY_CARE_PROVIDER_SITE_OTHER): Payer: Medicare Other | Admitting: Vascular Surgery

## 2022-06-13 VITALS — BP 130/62 | HR 54 | Temp 98.1°F | Resp 20 | Ht 72.0 in | Wt 142.0 lb

## 2022-06-13 DIAGNOSIS — I7142 Juxtarenal abdominal aortic aneurysm, without rupture: Secondary | ICD-10-CM | POA: Diagnosis not present

## 2022-06-13 NOTE — Progress Notes (Addendum)
REASON FOR VISIT:   Follow-up of juxtarenal abdominal aortic aneurysm.  MEDICAL ISSUES:   JUXTARENAL ABDOMINAL AORTIC ANEURYSM: The patient's juxtarenal aneurysm has increased in size.  It is now greater than 7 cm and his risk of rupture is 20 %/year.  As per my previous notes he would be at high risk for open repair and also for endovascular repair given his tortuous iliac vessels with significant calcific disease.  In addition he just recently had a suprapubic catheter placed which increases his risk of infection for open surgery.  The patient understands that this is a high risk situation either way.  Without repair he is at high risk for rupture.  If we were to undergo open repair he would be at high risk for infection given his suprapubic catheter that was just placed on 05/31/22.  I will review his CT scan again with Gore to consider endovascular repair again.  As per my previous notes my main consideration was the tortuosity in the left iliac system which is significantly calcified.   I had a long discussion with [name] and he is in agreement with our plan to hold off on any surgery for now.  He does understand the risk of rupture for an aneurysm of this size.    I will plan on seeing him back in 3 months with a follow-up ultrasound.  Will see how his catheter is healing at that time.  ADDENDUM: I did review his CT scan with Gore.  He may be a candidate for a fenestrated graft.  Given the tortuosity of his iliacs especially on the left one consideration would be a fenestrated cuff and then a Gore device below that.  I will discuss the case with Dr. [name] and see if he might be a candidate for a fenestrated graft here.  He had previously been very reluctant to consider going to [facility].    HPI:   [name] is a pleasant 76 y.o. male who I last saw on 01/03/2022 with a 6.7 cm juxtarenal abdominal aortic aneurysm.  He is not a candidate for a standard endovascular repair.  His  options would be open repair versus a fenestrated graft.  Based on the size of his aneurysm at that time his risk of rupture was 10 to 15 %/year.  He was reluctant to consider any surgery as I had explained that clearly open surgery would be associated with significant risk given his multiple medical comorbidities including coronary artery disease, atrial fibrillation, and a history of a neoplasm of the ampulla of Vater, history of adenocarcinoma the prostate, and a history of thrombocytopenia.  In addition he has severe protein calorie malnutrition.  His BMI at that time was 17.9.  We had potentially discussed referral to [facility] to be considered for a fenestrated graft and he was opposed to this.  Since I saw him last, he states that he has put on a few pounds.  He just recently had a suprapubic catheter placed for a bladder outlet obstruction.  He does have a history of prostate cancer.  Past Medical History:  Diagnosis Date   AAA (abdominal aortic aneurysm) (HCC)    followed by vasc surgery   Alcohol abuse    Barretts esophagus    bx distal esophagus 2011 neg    Bladder outlet obstruction 04/02/2018   CAD in native artery    a. s/p stent to prox RCA and mid Cx.   Cataract  Cholelithiasis    Chronic osteomyelitis involving ankle and foot, right (HCC) 07/13/2018   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   2006 - 3 adenomas largest 12 mm  2008 - no polyps 2013-  2 mm cecal polyp, adenoma, routine repeat colonoscopy 02/2017 approx 05/14/2017 18 polyps max 12 mm ALL adenomas recall 2019 Iva Boop, MD, [facility]      Dieulafoy lesion of duodenum    Diverticulosis of colon    DJD (degenerative joint disease)    left hip   Full dentures    upper and lower   GERD (gastroesophageal reflux disease)    diet controlled    Gout    feet   Hiatal hernia    2 cm noted on upper endos 2011    Hx of adenomatous colonic polyps    Hyperlipidemia    Hypertension    Internal hemorrhoid    Lung  cancer (HCC)    squamous cell ([id number]), 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 pt was on Plavix x 3 years then transitioned to ASA, no problems since   Non-small cell carcinoma of right lung, stage 1 (HCC) 01/30/2016   Renal failure, acute (HCC)    hx of 2/2 medication, resolved   Tobacco abuse    smoker .5 ppd x 55 yrs   UTI (lower urinary tract infection)     Family History  Problem Relation Age of Onset   Diabetes Mother    Hypertension Sister    Heart disease Sister    Diabetes Sister    Arthritis Brother    Gout Brother    Colon cancer Neg Hx    Esophageal cancer Neg Hx    Rectal cancer Neg Hx    Stomach cancer Neg Hx    Colon polyps Neg Hx    Inflammatory bowel disease Neg Hx    Liver disease Neg Hx    Pancreatic cancer Neg Hx     SOCIAL HISTORY: Social History   Tobacco Use   Smoking status: Every Day    Packs/day: 0.75    Years: 55.00    Total pack years: 41.25    Types: Cigarettes   Smokeless tobacco: Never   Tobacco comments:    Smokes 5-6 packs of cigarettes a week. 03/05/2022 Tay  Substance Use Topics   Alcohol use: Not Currently    Allergies  Allergen Reactions   Candesartan Cilexetil-Hctz Other (See Comments)    Acute renal failure   Hydrochlorothiazide Other (See Comments)    Acute renal failure   Shellfish Allergy Other (See Comments)    Gout occurs    Current Outpatient Medications  Medication Sig Dispense Refill   acetaminophen (TYLENOL 8 HOUR) 650 MG CR tablet Take 1 tablet (650 mg total) by mouth every 8 (eight) hours as needed for pain or fever. 30 tablet 0   albuterol (PROAIR HFA) 108 (90 Base) MCG/ACT inhaler Inhale 1-2 puffs into the lungs every 6 (six) hours as needed for wheezing or shortness of breath. 1 each 2   amLODipine (NORVASC) 10 MG tablet Take 1 tablet (10 mg total) by mouth daily. 90 tablet 3   escitalopram (LEXAPRO) 5 MG tablet Take 1 tablet (5 mg total) by mouth  daily. 90 tablet 1   HYDROcodone-acetaminophen (NORCO) 7.5-325 MG tablet Take 1 tablet by mouth 2 (two) times daily as needed for moderate pain. 60 tablet 0   Iron Combinations (CHROMAGEN)  capsule Take 1 capsule by mouth daily.     metoprolol succinate (TOPROL-XL) 50 MG 24 hr tablet Take 1 tablet (50 mg total) by mouth daily. Take with or immediately following a meal. 90 tablet 3   mirtazapine (REMERON) 7.5 MG tablet TAKE 1 TABLET BY MOUTH AT BEDTIME. 90 tablet 1   Pancrelipase, Lip-Prot-Amyl, (ZENPEP) 20000-63000 units CPEP Take 3 capsules by mouth 3 (three) times daily before meals. 270 capsule 3   pantoprazole (PROTONIX) 40 MG tablet TAKE 1 TABLET EVERY DAY FOR REFLUX 90 tablet 3   rosuvastatin (CRESTOR) 40 MG tablet Take 1 tablet (40 mg total) by mouth daily. 90 tablet 3   senna (SENOKOT) 8.6 MG TABS tablet Take 2 tablets (17.2 mg total) by mouth in the morning and at bedtime. (Patient taking differently: Take 1 tablet by mouth in the morning and at bedtime.) 120 tablet 1   tamsulosin (FLOMAX) 0.4 MG CAPS capsule Take 2 capsules (0.8 mg total) by mouth every evening. 180 capsule 3   vitamin C (ASCORBIC ACID) 500 MG tablet Take 500 mg by mouth daily.     No current facility-administered medications for this visit.    REVIEW OF SYSTEMS:  [X]  denotes positive finding, [ ]  denotes negative finding Cardiac  Comments:  Chest pain or chest pressure:    Shortness of breath upon exertion:    Short of breath when lying flat:    Irregular heart rhythm:        Vascular    Pain in calf, thigh, or hip brought on by ambulation:    Pain in feet at night that wakes you up from your sleep:     Blood clot in your veins:    Leg swelling:         Pulmonary    Oxygen at home:    Productive cough:     Wheezing:         Neurologic    Sudden weakness in arms or legs:     Sudden numbness in arms or legs:     Sudden onset of difficulty speaking or slurred speech:    Temporary loss of vision in one  eye:     Problems with dizziness:         Gastrointestinal    Blood in stool:     Vomited blood:         Genitourinary    Burning when urinating:     Blood in urine:        Psychiatric    Major depression:         Hematologic    Bleeding problems:    Problems with blood clotting too easily:        Skin    Rashes or ulcers:        Constitutional    Fever or chills:     PHYSICAL EXAM:   Vitals:   06/13/22 1235  BP: 130/62  Pulse: (!) 54  Resp: 20  Temp: 98.1 F (36.7 C)  SpO2: 94%  Weight: 142 lb (64.4 kg)  Height: 6' (1.829 m)   Body mass index is 19.26 kg/m.  GENERAL: The patient is a well-nourished male, in no acute distress. The vital signs are documented above. CARDIAC: There is a regular rate and rhythm.  VASCULAR: He has palpable femoral pulses. He has a brisk dorsalis pedis and posterior tibial signal bilaterally. PULMONARY: There is good air exchange bilaterally without wheezing or rales. ABDOMEN: Soft and non-tender with normal pitched  bowel sounds.  His aneurysm is easily palpable and nontender. He just had a suprapubic catheter placed a couple weeks ago in the lower abdomen. MUSCULOSKELETAL: There are no major deformities or cyanosis. NEUROLOGIC: No focal weakness or paresthesias are detected. SKIN: There are no ulcers or rashes noted. PSYCHIATRIC: The patient has a normal affect.  DATA:    CT ABDOMEN PELVIS: I reviewed his CT angio abdomen pelvis that was done on 05/29/2022.  Radiology interpreted this to show that his aneurysm is increased in size from 7.2 cm to 7.9 cm.  He has significant calcific disease and tortuosity in his iliac arteries bilaterally.  This is more significant on the left side.  Waverly [facility] [phone]

## 2022-06-18 ENCOUNTER — Ambulatory Visit: Payer: Self-pay | Admitting: *Deleted

## 2022-06-18 DIAGNOSIS — R338 Other retention of urine: Secondary | ICD-10-CM | POA: Diagnosis not present

## 2022-06-18 NOTE — Telephone Encounter (Signed)
  Chief Complaint: suprapubic catheter- no longer draining Symptoms: urine from penis- not draining into collection bag Frequency: started yesterday Pertinent Negatives: Patient denies pain, abdominal swelling Disposition: [] ED /[] Urgent Care (no appt availability in office) / [] Appointment(In office/virtual)/ []  [facility]/ [] Home Care/ [] Refused Recommended Disposition /[]  Mobile Bus/ [x]  Follow-up with PCP Additional Notes: Call to specialist- [facility] - call transferred to them for assistance

## 2022-06-18 NOTE — Telephone Encounter (Signed)
Summary: catheter issue   Pt had a catheter operation on 12.15.23/ he stated that now his bag wont fill / and it is coming out of his penis / he is not sure if that is suppose to be happening / please advise         Reason for Disposition  [1] No urine in bag > 4 hours AND [2] catheter is not kinked  Answer Assessment - Initial Assessment Questions 1. SYMPTOMS: "What symptoms are you concerned about?"     Urinary catheter not draining- suprapubic catheter placed- not draining properly 2. ONSET:  "When did the symptoms start?"     yesterday 3. FEVER: "Is there a fever?" If Yes, ask: "What is the temperature, how was it measured, and when did it start?"     no 4. ABDOMEN PAIN: "Is there any abdomen pain?" (e.g., Scale 1-10; or mild, moderate, severe)     no 5. URINE COLOR: "What color is the urine?"  "Is there blood present in the urine?" (e.g., clear, yellow, cloudy, tea-colored, blood streaks, bright red)     Bright yellow 6. URINE AMOUNT: "When did you last empty the urine from the collection bag?" "How much urine was in the bag at that time?" How much urine is in the collection bag now?"     Not collecting in bag 7. INSERTION: "How long have you (they) had the catheter?"     05/31/22 8. OTHER SYMPTOMS: "Are there any other symptoms?" (e.g., abdomen swelling, back pain, bladder spasms, constipation, foul smelling urine, leaking of urine)      Constipation- using medication to control 9. MEDICINES: "Are you taking any medicines to treat urinary problems?" (e.g., antibiotics for a urinary tract infection, medicines to treat bladder spasms)      Not sure  Protocols used: Urinary Catheter (e.g., Foley) Symptoms and Questions-A-AH

## 2022-06-19 ENCOUNTER — Other Ambulatory Visit: Payer: Self-pay

## 2022-06-19 DIAGNOSIS — I714 Abdominal aortic aneurysm, without rupture, unspecified: Secondary | ICD-10-CM

## 2022-07-04 ENCOUNTER — Other Ambulatory Visit: Payer: Self-pay | Admitting: Radiology

## 2022-07-05 ENCOUNTER — Other Ambulatory Visit (HOSPITAL_COMMUNITY): Payer: Self-pay | Admitting: Student

## 2022-07-08 ENCOUNTER — Other Ambulatory Visit: Payer: Self-pay

## 2022-07-08 ENCOUNTER — Ambulatory Visit (HOSPITAL_COMMUNITY)
Admission: RE | Admit: 2022-07-08 | Discharge: 2022-07-08 | Disposition: A | Discharge status: Home / Self Care | Payer: 59 | Source: Ambulatory Visit | Attending: Interventional Radiology | Admitting: Interventional Radiology

## 2022-07-08 ENCOUNTER — Encounter (HOSPITAL_COMMUNITY): Payer: Self-pay

## 2022-07-08 VITALS — BP 128/88 | HR 34 | Temp 98.1°F | Resp 18 | Ht 72.0 in | Wt 140.0 lb

## 2022-07-08 DIAGNOSIS — R339 Retention of urine, unspecified: Secondary | ICD-10-CM | POA: Diagnosis not present

## 2022-07-08 DIAGNOSIS — M25551 Pain in right hip: Secondary | ICD-10-CM

## 2022-07-08 DIAGNOSIS — I2510 Atherosclerotic heart disease of native coronary artery without angina pectoris: Secondary | ICD-10-CM | POA: Insufficient documentation

## 2022-07-08 DIAGNOSIS — E785 Hyperlipidemia, unspecified: Secondary | ICD-10-CM | POA: Insufficient documentation

## 2022-07-08 DIAGNOSIS — Z435 Encounter for attention to cystostomy: Secondary | ICD-10-CM | POA: Diagnosis not present

## 2022-07-08 DIAGNOSIS — Z955 Presence of coronary angioplasty implant and graft: Secondary | ICD-10-CM | POA: Insufficient documentation

## 2022-07-08 DIAGNOSIS — Z85118 Personal history of other malignant neoplasm of bronchus and lung: Secondary | ICD-10-CM | POA: Diagnosis not present

## 2022-07-08 DIAGNOSIS — I4891 Unspecified atrial fibrillation: Secondary | ICD-10-CM | POA: Insufficient documentation

## 2022-07-08 DIAGNOSIS — I714 Abdominal aortic aneurysm, without rupture, unspecified: Secondary | ICD-10-CM | POA: Insufficient documentation

## 2022-07-08 DIAGNOSIS — I252 Old myocardial infarction: Secondary | ICD-10-CM | POA: Insufficient documentation

## 2022-07-08 DIAGNOSIS — M109 Gout, unspecified: Secondary | ICD-10-CM | POA: Insufficient documentation

## 2022-07-08 DIAGNOSIS — C61 Malignant neoplasm of prostate: Secondary | ICD-10-CM

## 2022-07-08 DIAGNOSIS — I10 Essential (primary) hypertension: Secondary | ICD-10-CM | POA: Diagnosis not present

## 2022-07-08 DIAGNOSIS — F17210 Nicotine dependence, cigarettes, uncomplicated: Secondary | ICD-10-CM | POA: Diagnosis not present

## 2022-07-08 DIAGNOSIS — Z79891 Long term (current) use of opiate analgesic: Secondary | ICD-10-CM

## 2022-07-08 DIAGNOSIS — Z9359 Other cystostomy status: Secondary | ICD-10-CM

## 2022-07-08 HISTORY — PX: IR CATHETER TUBE CHANGE: IMG717

## 2022-07-08 MED ORDER — MIDAZOLAM HCL 2 MG/2ML IJ SOLN
INTRAMUSCULAR | Status: AC | PRN
  Administered 2022-07-08: 1 mg via INTRAVENOUS

## 2022-07-08 MED ORDER — SODIUM CHLORIDE 0.9 % IV SOLN: INTRAVENOUS | Status: DC

## 2022-07-08 MED ORDER — LIDOCAINE HCL 1 % IJ SOLN
INTRAMUSCULAR | Status: AC
  Filled 2022-07-08: qty 20

## 2022-07-08 MED ORDER — MIDAZOLAM HCL 2 MG/2ML IJ SOLN
INTRAMUSCULAR | Status: AC | PRN
  Administered 2022-07-08 (×2): .5 mg via INTRAVENOUS

## 2022-07-08 MED ORDER — METOPROLOL SUCCINATE ER 50 MG PO TB24: 50.0000 mg | ORAL_TABLET | Freq: Every day | ORAL | 3 refills | Status: DC

## 2022-07-08 MED ORDER — IOHEXOL 300 MG/ML  SOLN
50.0000 mL | Freq: Once | INTRAMUSCULAR | Status: AC | PRN
  Administered 2022-07-08: 10 mL

## 2022-07-08 MED ORDER — ZENPEP 20000-63000 UNITS PO CPEP: 3.0000 | ORAL_CAPSULE | Freq: Three times a day (TID) | ORAL | 3 refills | Status: DC

## 2022-07-08 MED ORDER — MIDAZOLAM HCL 2 MG/2ML IJ SOLN
INTRAMUSCULAR | Status: AC
  Filled 2022-07-08: qty 2

## 2022-07-08 MED ORDER — FENTANYL CITRATE (PF) 100 MCG/2ML IJ SOLN
INTRAMUSCULAR | Status: AC
  Filled 2022-07-08: qty 2

## 2022-07-08 MED ORDER — HYDROCODONE-ACETAMINOPHEN 7.5-325 MG PO TABS: 1.0000 | ORAL_TABLET | Freq: Two times a day (BID) | ORAL | 0 refills | Status: DC | PRN

## 2022-07-08 MED ORDER — FENTANYL CITRATE (PF) 100 MCG/2ML IJ SOLN
INTRAMUSCULAR | Status: AC | PRN
  Administered 2022-07-08 (×4): 50 ug via INTRAVENOUS

## 2022-07-08 NOTE — Progress Notes (Signed)
Patient was given discharge instructions. He verbalized understanding. 

## 2022-07-08 NOTE — Procedures (Signed)
Interventional Radiology Procedure Note  Procedure: Upsize and exchange of suprapubic catheter  Complications: None  Estimated Blood Loss: None  Recommendations: - Routine exchanges in urology office every 3 mo  Signed,  Sterling Big, MD

## 2022-07-08 NOTE — H&P (Signed)
Chief Complaint: Patient was seen in consultation today for urinary retention  Referring Physician(s): Dr. Bartholomew Crews  Supervising Physician: Malachy Moan  Patient Status: [facility] - Out-pt  History of Present Illness: [name] is a 77 y.o. male with a past medical history significant for chronic pain, malnutrition, gout, CAD s/p stenting, MI, a.fib, HTN, HLD, AAA, non-small cell lung cancer s/p surgical treatment, ampullary adenoma and areflexic bladder with urinary retention who presented 05/31/22 for suprapubic catheter placement.  He returns today for exchange and upsize.   [name] presents in his usual state of health today.  He has been NPO.  He reports no issues with his SPT.  There was mention of increased urination via his penis, but he was evaluated by [facility] who found his tube was clogged and were able to restore.  He denies known issues related to his catheter today.    Past Medical History:  Diagnosis Date   AAA (abdominal aortic aneurysm) (HCC)    followed by vasc surgery   Alcohol abuse    Barretts esophagus    bx distal esophagus 2011 neg    Bladder outlet obstruction 04/02/2018   CAD in native artery    a. s/p stent to prox RCA and mid Cx.   Cataract    Cholelithiasis    Chronic osteomyelitis involving ankle and foot, right (HCC) 07/13/2018   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   2006 - 3 adenomas largest 12 mm  2008 - no polyps 2013-  2 mm cecal polyp, adenoma, routine repeat colonoscopy 02/2017 approx 05/14/2017 18 polyps max 12 mm ALL adenomas recall 2019 Iva Boop, MD, [facility]      Dieulafoy lesion of duodenum    Diverticulosis of colon    DJD (degenerative joint disease)    left hip   Full dentures    upper and lower   GERD (gastroesophageal reflux disease)    diet controlled    Gout    feet   Hiatal hernia    2 cm noted on upper endos 2011    Hx of adenomatous colonic polyps    Hyperlipidemia    Hypertension     Internal hemorrhoid    Lung cancer (HCC)    squamous cell (T2N0MX), 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 pt was on Plavix x 3 years then transitioned to ASA, no problems since   Non-small cell carcinoma of right lung, stage 1 (HCC) 01/30/2016   Renal failure, acute (HCC)    hx of 2/2 medication, resolved   Tobacco abuse    smoker .5 ppd x 55 yrs   UTI (lower urinary tract infection)     Past Surgical History:  Procedure Laterality Date   BIOPSY  11/12/2020   Procedure: BIOPSY;  Surgeon: Sherrilyn Rist, MD;  Location: [facility] ENDOSCOPY;  Service: Gastroenterology;;   BIOPSY  07/25/2021   Procedure: BIOPSY;  Surgeon: Jenel Lucks, MD;  Location: [facility] ENDOSCOPY;  Service: Gastroenterology;;   BIOPSY  12/24/2021   Procedure: BIOPSY;  Surgeon: Lemar Lofty., MD;  Location: [facility] ENDOSCOPY;  Service: Gastroenterology;;   BRONCHIAL BIOPSY  07/27/2021   Procedure: BRONCHIAL BIOPSIES;  Surgeon: Leslye Peer, MD;  Location: [facility] ENDOSCOPY;  Service: Cardiopulmonary;;   BRONCHIAL BRUSHINGS  07/27/2021   Procedure: BRONCHIAL BRUSHINGS;  Surgeon: Leslye Peer, MD;  Location: [facility] ENDOSCOPY;  Service: Cardiopulmonary;;   cataract surgery  Bilateral 2018   removal of cataracts   COLONOSCOPY     hx polyps - Leone Payor 02/2012   COLONOSCOPY WITH PROPOFOL N/A 11/12/2020   Procedure: COLONOSCOPY WITH PROPOFOL;  Surgeon: Sherrilyn Rist, MD;  Location: [facility] ENDOSCOPY;  Service: Gastroenterology;  Laterality: N/A;   CORONARY ANGIOPLASTY WITH STENT PLACEMENT     ESOPHAGOGASTRODUODENOSCOPY (EGD) WITH PROPOFOL N/A 11/12/2020   Procedure: ESOPHAGOGASTRODUODENOSCOPY (EGD) WITH PROPOFOL;  Surgeon: Sherrilyn Rist, MD;  Location: [facility] ENDOSCOPY;  Service: Gastroenterology;  Laterality: N/A;   ESOPHAGOGASTRODUODENOSCOPY (EGD) WITH PROPOFOL N/A 03/06/2021   Procedure: ESOPHAGOGASTRODUODENOSCOPY (EGD) WITH PROPOFOL;  Surgeon: Benancio Deeds, MD;   Location: [facility] ENDOSCOPY;  Service: Gastroenterology;  Laterality: N/A;   ESOPHAGOGASTRODUODENOSCOPY (EGD) WITH PROPOFOL N/A 07/25/2021   Procedure: ESOPHAGOGASTRODUODENOSCOPY (EGD) WITH PROPOFOL;  Surgeon: Jenel Lucks, MD;  Location: [facility] ENDOSCOPY;  Service: Gastroenterology;  Laterality: N/A;   ESOPHAGOGASTRODUODENOSCOPY (EGD) WITH PROPOFOL N/A 12/24/2021   Procedure: ESOPHAGOGASTRODUODENOSCOPY (EGD) WITH PROPOFOL;  Surgeon: Meridee Score Netty Starring., MD;  Location: [facility] ENDOSCOPY;  Service: Gastroenterology;  Laterality: N/A;   flexible cystourethroscopy  05/24/06   HEMOSTASIS CLIP PLACEMENT  03/06/2021   Procedure: HEMOSTASIS CLIP PLACEMENT;  Surgeon: Benancio Deeds, MD;  Location: [facility] ENDOSCOPY;  Service: Gastroenterology;;   HEMOSTASIS CLIP PLACEMENT  12/24/2021   Procedure: HEMOSTASIS CLIP PLACEMENT;  Surgeon: Lemar Lofty., MD;  Location: [facility] ENDOSCOPY;  Service: Gastroenterology;;   HOT HEMOSTASIS N/A 03/06/2021   Procedure: HOT HEMOSTASIS (ARGON PLASMA COAGULATION/BICAP);  Surgeon: Benancio Deeds, MD;  Location: [facility] ENDOSCOPY;  Service: Gastroenterology;  Laterality: N/A;   INGUINAL HERNIA REPAIR  2005   LUNG LOBECTOMY Right 11/07   RUL   MULTIPLE TOOTH EXTRACTIONS     POLYPECTOMY  11/12/2020   Procedure: POLYPECTOMY;  Surgeon: Sherrilyn Rist, MD;  Location: [facility] ENDOSCOPY;  Service: Gastroenterology;;   POLYPECTOMY  12/24/2021   Procedure: POLYPECTOMY;  Surgeon: Lemar Lofty., MD;  Location: [facility] ENDOSCOPY;  Service: Gastroenterology;;   SUBMUCOSAL LIFTING INJECTION  12/24/2021   Procedure: SUBMUCOSAL LIFTING INJECTION;  Surgeon: Lemar Lofty., MD;  Location: [facility] ENDOSCOPY;  Service: Gastroenterology;;   TRANSURETHRAL RESECTION OF PROSTATE N/A 05/02/2021   Procedure: TRANSURETHRAL RESECTION OF THE PROSTATE (TURP);  Surgeon: Crista Elliot, MD;  Location: [facility] ORS;  Service: Urology;  Laterality: N/A;   UPPER ESOPHAGEAL ENDOSCOPIC ULTRASOUND (EUS) N/A  12/24/2021   Procedure: UPPER ESOPHAGEAL ENDOSCOPIC ULTRASOUND (EUS);  Surgeon: Lemar Lofty., MD;  Location: [facility] ENDOSCOPY;  Service: Gastroenterology;  Laterality: N/A;   UPPER GASTROINTESTINAL ENDOSCOPY     VIDEO BRONCHOSCOPY  07/27/2021   Procedure: VIDEO BRONCHOSCOPY WITHOUT FLUORO;  Surgeon: Leslye Peer, MD;  Location: [facility] ENDOSCOPY;  Service: Cardiopulmonary;;    Allergies: Candesartan cilexetil-hctz, Hydrochlorothiazide, and Shellfish allergy  Medications: Prior to Admission medications   Medication Sig Start Date End Date Taking? Authorizing Provider  acetaminophen (TYLENOL 8 HOUR) 650 MG CR tablet Take 1 tablet (650 mg total) by mouth every 8 (eight) hours as needed for pain or fever. 06/19/21   Derwood Kaplan, MD  albuterol (PROAIR HFA) 108 (90 Base) MCG/ACT inhaler Inhale 1-2 puffs into the lungs every 6 (six) hours as needed for wheezing or shortness of breath. 03/05/22   Parrett, Virgel Bouquet, NP  amLODipine (NORVASC) 10 MG tablet Take 1 tablet (10 mg total) by mouth daily. 04/01/22   Tyson Alias, MD  escitalopram (LEXAPRO) 5 MG tablet Take 1 tablet (5 mg total) by mouth daily. 01/28/22 07/27/22  Inez Catalina, MD  HYDROcodone-acetaminophen (NORCO) 7.5-325 MG tablet Take 1 tablet by mouth 2 (two) times daily as needed for moderate pain. 06/06/22   Tyson Alias, MD  Iron Combinations (CHROMAGEN) capsule Take 1 capsule by mouth daily.    [provider]  metoprolol succinate (TOPROL-XL) 50 MG 24 hr tablet Take 1 tablet (50 mg total) by mouth daily. Take with or immediately following a meal. 04/22/22   Sharlene Dory, NP  mirtazapine (REMERON) 7.5 MG tablet TAKE 1 TABLET BY MOUTH AT BEDTIME. 01/18/22   Tyson Alias, MD  Pancrelipase, Lip-Prot-Amyl, (ZENPEP) 20000-63000 units CPEP Take 3 capsules by mouth 3 (three) times daily before meals. 05/02/22   Tyson Alias, MD  pantoprazole (PROTONIX) 40 MG tablet TAKE 1 TABLET EVERY DAY FOR  REFLUX 12/25/21   Tyson Alias, MD  rosuvastatin (CRESTOR) 40 MG tablet Take 1 tablet (40 mg total) by mouth daily. 04/22/22   Sharlene Dory, NP  senna (SENOKOT) 8.6 MG TABS tablet Take 2 tablets (17.2 mg total) by mouth in the morning and at bedtime. Patient taking differently: Take 1 tablet by mouth in the morning and at bedtime. 06/29/21   Tyson Alias, MD  tamsulosin (FLOMAX) 0.4 MG CAPS capsule Take 2 capsules (0.8 mg total) by mouth every evening. 01/30/22   Dickie La, MD  vitamin C (ASCORBIC ACID) 500 MG tablet Take 500 mg by mouth daily.    [provider]     Family History  Problem Relation Age of Onset   Diabetes Mother    Hypertension Sister    Heart disease Sister    Diabetes Sister    Arthritis Brother    Gout Brother    Colon cancer Neg Hx    Esophageal cancer Neg Hx    Rectal cancer Neg Hx    Stomach cancer Neg Hx    Colon polyps Neg Hx    Inflammatory bowel disease Neg Hx    Liver disease Neg Hx    Pancreatic cancer Neg Hx     Social History   Socioeconomic History   Marital status: Single    Spouse name: Not on file   Number of children: 0   Years of education: Not on file   Highest education level: Not on file  Occupational History   Not on file  Tobacco Use   Smoking status: Every Day    Packs/day: 0.75    Years: 55.00    Total pack years: 41.25    Types: Cigarettes   Smokeless tobacco: Never   Tobacco comments:    Smokes 5-6 packs of cigarettes a week. 03/05/2022 Tay  Vaping Use   Vaping Use: Never used  Substance and Sexual Activity   Alcohol use: Not Currently   Drug use: No   Sexual activity: Not on file  Other Topics Concern   Not on file  Social History Narrative   Not on file   Social Determinants of Health   Financial Resource Strain: Not on file  Food Insecurity: Not on file  Transportation Needs: Not on file  Physical Activity: Not on file  Stress: Not on file  Social Connections: Not on file      Review of Systems: A 12 point ROS discussed and pertinent positives are indicated in the HPI above.  All other systems are negative.  Review of Systems  Constitutional:  Negative for fatigue and fever.  Respiratory:  Negative for cough and shortness of breath.  Cardiovascular:  Negative for chest pain.  Gastrointestinal:  Negative for abdominal pain, nausea and vomiting.  Musculoskeletal:  Negative for back pain.  Psychiatric/Behavioral:  Negative for behavioral problems and confusion.     Vital Signs: BP (!) 148/72   Pulse (!) 55   Temp 98.1 F (36.7 C) (Oral)   Resp 18   Ht 6' (1.829 m)   Wt 140 lb (63.5 kg)   SpO2 98%   BMI 18.99 kg/m   Physical Exam Vitals and nursing note reviewed.  Constitutional:      General: He is not in acute distress.    Appearance: Normal appearance. He is not ill-appearing.  HENT:     Mouth/Throat:     Mouth: Mucous membranes are moist.     Pharynx: Oropharynx is clear.  Cardiovascular:     Rate and Rhythm: Normal rate and regular rhythm.  Pulmonary:     Effort: Pulmonary effort is normal. No respiratory distress.     Breath sounds: Normal breath sounds.  Abdominal:     General: Abdomen is flat.     Palpations: Abdomen is soft.     Comments: SPT in place.  Suture has dislodged, but otherwise tube intact.  Functioning.  Urine dark colored, bag has turned purple  Neurological:     General: No focal deficit present.     Mental Status: He is alert and oriented to person, place, and time. Mental status is at baseline.  Psychiatric:        Mood and Affect: Mood normal.        Behavior: Behavior normal.        Thought Content: Thought content normal.        Judgment: Judgment normal.      MD Evaluation Airway: WNL Heart: WNL Abdomen: WNL Chest/ Lungs: WNL ASA  Classification: 3 Mallampati/Airway Score: Two   Imaging: No results found.  Labs:  CBC: Recent Labs    07/29/21 0119 09/04/21 1124 10/12/21 0948  05/20/22 1007  WBC 3.0* 5.3 5.4 6.4  HGB 9.3* 10.4* 10.9* 11.9*  HCT 30.3* 32.7* 34.1* 37.0*  PLT 84* 196.0 152.0 157    COAGS: Recent Labs    07/23/21 1130 10/12/21 0948  INR 1.1 1.0  APTT 33  --     BMP: Recent Labs    07/27/21 0139 07/28/21 0209 07/29/21 0119 07/30/21 0715 09/04/21 1124 09/12/21 0954 10/12/21 0948 11/26/21 1554 05/20/22 1007  NA 135 137 139  --    < > 141 140 145* 140  K 3.6 3.2* 4.1  --    < > 3.3* 3.7 3.8 3.5  CL 98 100 103  --    < > 109 106 106 104  CO2 30 31 28   --    < > 24 27 23 22   GLUCOSE 85 89 125*  --    < > 87 93 89 88  BUN 21 23 20   --    < > 14 17 13 11   CALCIUM 9.1 9.1 9.0  --    < > 9.1 9.3 9.5 9.5  CREATININE 1.06 1.00 0.99 0.93   < > 1.00 1.04 1.14 1.03  GFRNONAA >60 >60 >60 >60  --   --   --   --   --    < > = values in this interval not displayed.    LIVER FUNCTION TESTS: Recent Labs    10/01/21 1425 10/12/21 0948 04/03/22 1037 05/20/22 1007  BILITOT 0.5 0.6 0.6 0.5  AST 85* 49* 17 37  ALT 63* 40 9 23  ALKPHOS 102 69 89 93  PROT 7.0 7.1 7.7 7.6  ALBUMIN 3.6 3.7 3.9 4.2    TUMOR MARKERS: No results for input(s): "AFPTM", "CEA", "CA199", "CHROMGRNA" in the last 8760 hours.  Assessment and Plan: Patient with past medical history of areflexic bladder s/p recent suprapubic catheter placement presents for routine exchange and upsize.  Case reviewed by Dr. Archer Asa who approves patient for procedure.  Patient presents today in their usual state of health.  He has been NPO and is not currently on blood thinners. His sister is available for transportation to his facility.   Risks and benefits discussed with the patient including bleeding, infection, damage to adjacent structures, bowel perforation/fistula connection, and sepsis.  All of the patient's questions were answered, patient is agreeable to proceed. Consent signed and in chart.  Thank you for this interesting consult.  I greatly enjoyed meeting [name] and look forward to participating in their care.  A copy of this report was sent to the requesting provider on this date.  Electronically Signed: Hoyt Koch, PA 07/08/2022, 9:38 AM   I spent a total of    15 Minutes in face to face in clinical consultation, greater than 50% of which was counseling/coordinating care for urinary retention.

## 2022-07-15 ENCOUNTER — Ambulatory Visit (INDEPENDENT_AMBULATORY_CARE_PROVIDER_SITE_OTHER): Payer: 59 | Admitting: Surgery

## 2022-07-15 ENCOUNTER — Encounter: Payer: Self-pay | Admitting: Surgery

## 2022-07-15 VITALS — BP 118/71 | HR 54 | Temp 97.9°F | Resp 20 | Ht 72.0 in | Wt 139.0 lb

## 2022-07-15 DIAGNOSIS — I7142 Juxtarenal abdominal aortic aneurysm, without rupture: Secondary | ICD-10-CM

## 2022-07-15 NOTE — Progress Notes (Signed)
[facility] [place]  Patient name: [name] MRN: [id number] DOB: April 28, 1946 Sex: male   REASON FOR VISIT:    Follow up AAA  HISOTRY OF PRESENT ILLNESS:    [name] is a 77 y.o. male who I have been asked to see the by Dr. Scot Dock for a 7 cm juxtarenal abdominal aortic aneurysm.  He has a suprapubic catheter in place which makes open surgery complicated, therefore endovascular repair would be preferable.  Previously, the patient has been reluctant to consider surgery as he was recovering from multiple medical issues.  He has a history of a neoplasm of ampulla of Vater, lung cancer, and prostate adenocarcinoma.  He is also had issues with protein calorie malnutrition and significant weight loss.  He has a history of coronary artery disease, status post PCI in the past   PAST MEDICAL HISTORY:   Past Medical History:  Diagnosis Date   AAA (abdominal aortic aneurysm) ([place])    followed by vasc surgery   Alcohol abuse    Barretts esophagus    bx distal esophagus 2011 neg    Bladder outlet obstruction 04/02/2018   CAD in native artery    a. s/p stent to prox RCA and mid Cx.   Cataract    Cholelithiasis    Chronic osteomyelitis involving ankle and foot, right ([place]) 07/13/2018   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   2006 - 3 adenomas largest 12 mm  2008 - no polyps 2013-  2 mm cecal polyp, adenoma, routine repeat colonoscopy 02/2017 approx 05/14/2017 18 polyps max 12 mm ALL adenomas recall 2019 [name], MD, [facility]      Dieulafoy lesion of duodenum    Diverticulosis of colon    DJD (degenerative joint disease)    left hip   Full dentures    upper and lower   GERD (gastroesophageal reflux disease)    diet controlled    Gout    feet   Hiatal hernia    2 cm noted on upper endos 2011    Hx of adenomatous colonic polyps    Hyperlipidemia    Hypertension    Internal hemorrhoid    Lung cancer ([place])     squamous cell (T2N0MX), 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 pt was on Plavix x 3 years then transitioned to ASA, no problems since   Non-small cell carcinoma of right lung, stage 1 ([place]) 01/30/2016   Renal failure, acute (HCC)    hx of 2/2 medication, resolved   Tobacco abuse    smoker .5 ppd x 55 yrs   UTI (lower urinary tract infection)      FAMILY HISTORY:   Family History  Problem Relation Age of Onset   Diabetes Mother    Hypertension Sister    Heart disease Sister    Diabetes Sister    Arthritis Brother    Gout Brother    Colon cancer Neg Hx    Esophageal cancer Neg Hx    Rectal cancer Neg Hx    Stomach cancer Neg Hx    Colon polyps Neg Hx    Inflammatory bowel disease Neg Hx    Liver disease Neg Hx    Pancreatic cancer Neg Hx     SOCIAL HISTORY:   Social History   Tobacco Use   Smoking status: Every Day    Packs/day: 0.75  Years: 55.00    Total pack years: 41.25    Types: Cigarettes   Smokeless tobacco: Never   Tobacco comments:    Smokes 5-6 packs of cigarettes a week. 03/05/2022 Tay  Substance Use Topics   Alcohol use: Not Currently     ALLERGIES:   Allergies  Allergen Reactions   Candesartan Cilexetil-Hctz Other (See Comments)    Acute renal failure   Hydrochlorothiazide Other (See Comments)    Acute renal failure   Shellfish Allergy Other (See Comments)    Gout occurs     CURRENT MEDICATIONS:   Current Outpatient Medications  Medication Sig Dispense Refill   acetaminophen (TYLENOL 8 HOUR) 650 MG CR tablet Take 1 tablet (650 mg total) by mouth every 8 (eight) hours as needed for pain or fever. 30 tablet 0   albuterol (PROAIR HFA) 108 (90 Base) MCG/ACT inhaler Inhale 1-2 puffs into the lungs every 6 (six) hours as needed for wheezing or shortness of breath. 1 each 2   amLODipine (NORVASC) 10 MG tablet Take 1 tablet (10 mg total) by mouth daily. 90 tablet 3   escitalopram  (LEXAPRO) 5 MG tablet Take 1 tablet (5 mg total) by mouth daily. 90 tablet 1   HYDROcodone-acetaminophen (NORCO) 7.5-325 MG tablet Take 1 tablet by mouth 2 (two) times daily as needed for moderate pain. 60 tablet 0   Iron Combinations (CHROMAGEN) capsule Take 1 capsule by mouth daily.     metoprolol succinate (TOPROL-XL) 50 MG 24 hr tablet Take 1 tablet (50 mg total) by mouth daily. Take with or immediately following a meal. 90 tablet 3   mirtazapine (REMERON) 7.5 MG tablet TAKE 1 TABLET BY MOUTH AT BEDTIME. 90 tablet 1   Pancrelipase, Lip-Prot-Amyl, (ZENPEP) 20000-63000 units CPEP Take 3 capsules by mouth 3 (three) times daily before meals. 270 capsule 3   pantoprazole (PROTONIX) 40 MG tablet TAKE 1 TABLET EVERY DAY FOR REFLUX 90 tablet 3   rosuvastatin (CRESTOR) 40 MG tablet Take 1 tablet (40 mg total) by mouth daily. 90 tablet 3   senna (SENOKOT) 8.6 MG TABS tablet Take 2 tablets (17.2 mg total) by mouth in the morning and at bedtime. (Patient taking differently: Take 1 tablet by mouth in the morning and at bedtime.) 120 tablet 1   tamsulosin (FLOMAX) 0.4 MG CAPS capsule Take 2 capsules (0.8 mg total) by mouth every evening. 180 capsule 3   vitamin C (ASCORBIC ACID) 500 MG tablet Take 500 mg by mouth daily.     No current facility-administered medications for this visit.    REVIEW OF SYSTEMS:   [X]  denotes positive finding, [ ]  denotes negative finding Cardiac  Comments:  Chest pain or chest pressure:    Shortness of breath upon exertion:    Short of breath when lying flat:    Irregular heart rhythm:        Vascular    Pain in calf, thigh, or hip brought on by ambulation:    Pain in feet at night that wakes you up from your sleep:     Blood clot in your veins:    Leg swelling:         Pulmonary    Oxygen at home:    Productive cough:     Wheezing:         Neurologic    Sudden weakness in arms or legs:     Sudden numbness in arms or legs:     Sudden onset of difficulty  speaking  or slurred speech:    Temporary loss of vision in one eye:     Problems with dizziness:         Gastrointestinal    Blood in stool:     Vomited blood:         Genitourinary    Burning when urinating:     Blood in urine:        Psychiatric    Major depression:         Hematologic    Bleeding problems:    Problems with blood clotting too easily:        Skin    Rashes or ulcers:        Constitutional    Fever or chills:      PHYSICAL EXAM:   Vitals:   07/15/22 0958  BP: 118/71  Pulse: (!) 54  Resp: 20  Temp: 97.9 F (36.6 C)  SpO2: 95%  Weight: 139 lb (63 kg)  Height: 6' (1.829 m)    GENERAL: The patient is a well-nourished male, in no acute distress. The vital signs are documented above. CARDIAC: There is a regular rate and rhythm.  VASCULAR: I had trouble palpating pedal pulses PULMONARY: Non-labored respirations ABDOMEN: Soft and non-tender with pulsatile mass.  MUSCULOSKELETAL: There are no major deformities or cyanosis. NEUROLOGIC: No focal weakness or paresthesias are detected. SKIN: There are no ulcers or rashes noted. PSYCHIATRIC: The patient has a normal affect.  STUDIES:   I have reviewed the following CTA:  VASCULAR   1. Continued enlargement of previously visualized fusiform infrarenal abdominal aortic aneurysm now measuring up to 7.9 cm, previously 7.2 cm. Recommend referral to a vascular specialist if not already obtained. This recommendation follows ACR consensus guidelines: White Paper of the ACR Incidental Findings Committee II on Vascular Findings. J Am Coll Radiol 2013; 10:789-794. 2. Poor opacification of the inflow vessels due to contrast bolus timing in likely a degree of diminished cardiac output. 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 3.  Aortic Atherosclerosis (ICD10-I70.0). 4. Biatrial cardiomegaly.   NON-VASCULAR   1. No  acute abdominopelvic abnormality. 2.  Emphysema (ICD10-J43.9). 3. Prostatomegaly.  PLAN:  Juxtarenal 7 9 cm aneurysm: I discussed with the patient that he is at significant risk for rupture with nonsurgical treatment.  I think he is a poor open surgical repair candidate because of his indwelling suprapubic catheter for his bladder outlet obstruction.  From endovascular standpoint, he will require a fenestrated graft.  This will be somewhat challenging because of the tortuosity within his iliac vessels as well as a short right common iliac artery which will likely require coil embolization but potentially I could fenestrate a iliac limb.  I discussed the potential risks of surgery with the patient including but not limited to the risk of renal insufficiency, intestinal ischemia, cardiopulmonary complications, groin access complications, death.  At this point, he wishes to proceed.  I will get his graft made and then get him scheduled in the near future.    Leia Alf, MD, FACS [facility] [phone] Pager [phone]

## 2022-07-19 ENCOUNTER — Telehealth: Payer: Self-pay

## 2022-07-19 NOTE — Telephone Encounter (Signed)
Attempted to reach patient to schedule surgery on both numbers listed, no answer. Able to leave voicemail to return call on cell phone number listed.

## 2022-07-22 NOTE — Telephone Encounter (Signed)
Left voice mail message for patient to return call.

## 2022-07-23 ENCOUNTER — Other Ambulatory Visit: Payer: Self-pay | Admitting: *Deleted

## 2022-07-23 DIAGNOSIS — I714 Abdominal aortic aneurysm, without rupture, unspecified: Secondary | ICD-10-CM

## 2022-07-24 ENCOUNTER — Other Ambulatory Visit: Payer: Self-pay | Admitting: Internal Medicine

## 2022-07-24 DIAGNOSIS — F32A Depression, unspecified: Secondary | ICD-10-CM

## 2022-07-25 ENCOUNTER — Ambulatory Visit (INDEPENDENT_AMBULATORY_CARE_PROVIDER_SITE_OTHER): Payer: 59 | Admitting: Internal Medicine

## 2022-07-25 ENCOUNTER — Encounter: Payer: Self-pay | Admitting: Internal Medicine

## 2022-07-25 VITALS — BP 124/60 | HR 95 | Ht 72.0 in | Wt 141.0 lb

## 2022-07-25 DIAGNOSIS — I7142 Juxtarenal abdominal aortic aneurysm, without rupture: Secondary | ICD-10-CM

## 2022-07-25 DIAGNOSIS — D135 Benign neoplasm of extrahepatic bile ducts: Secondary | ICD-10-CM

## 2022-07-25 NOTE — Progress Notes (Signed)
[name] 77 y.o. January 19, 1946 [id number]  Assessment & Plan:   Encounter Diagnoses  Name Primary?   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  It is not causing problems at this time.  Most recent LFTs are normal and may have been.  I will wait for his stenting procedure in March and then follow-up and arrange office follow-up to discuss potential ampullectomy.  He will need tertiary referral (we canceled an appointment at [facility] due to the aneurysm), as Dr. Rush Landmark thinks that the technical aspects of ampullectomy are such that he may even need surgical resection as opposed to endoscopic removal.  CC: Axel Filler, MD Dr. Annamarie Major  Subjective:  Gastroenterology summary  Attenuated polyposis coli 2006 - 3 adenomas largest 12 mm  2008 - no polyps 2013-  2 mm cecal polyp, adenoma, routine repeat colonoscopy 02/2017 approx 05/14/2017 18 polyps max 12 mm ALL adenomas  - patient declined genetics referral. 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  He is here for follow-up.  He is not having any digestive complaints today.  He has not had any signs of icterus.  His urine is normal (visible through his suprapubic collection bag).  He is slated to have stenting of his abdominal aortic aneurysm by Dr. Trula Slade on  March 1. Wt Readings from Last 3 Encounters:  07/25/22 141 lb (64 kg)  07/15/22 139 lb (63 kg)  07/08/22 140 lb (63.5 kg)     Allergies  Allergen Reactions   Candesartan Cilexetil-Hctz Other (See Comments)    Acute renal failure   Hydrochlorothiazide Other (See Comments)    Acute renal failure   Shellfish Allergy Other (See Comments)    Gout occurs   Current Meds  Medication Sig   acetaminophen (TYLENOL 8 HOUR) 650 MG CR tablet Take 1 tablet (650 mg total) by mouth every 8 (eight) hours as needed for pain or fever.   albuterol (PROAIR HFA) 108 (90 Base) MCG/ACT inhaler Inhale 1-2 puffs into the lungs every 6 (six) hours as needed for wheezing or shortness of breath.   amLODipine (NORVASC) 10 MG tablet Take 1 tablet (10 mg total) by mouth daily.   escitalopram (LEXAPRO) 5 MG tablet TAKE 1 TABLET (5 MG TOTAL) BY MOUTH DAILY.   HYDROcodone-acetaminophen (NORCO) 7.5-325 MG tablet Take 1 tablet by mouth 2 (two) times daily as needed for moderate pain.   Iron Combinations (CHROMAGEN) capsule Take 1 capsule by mouth daily.   metoprolol succinate (TOPROL-XL) 50 MG 24 hr tablet Take 1 tablet (50 mg total) by mouth daily. Take with or immediately following a meal.   mirtazapine (REMERON) 7.5 MG tablet TAKE 1 TABLET BY MOUTH AT BEDTIME.   Pancrelipase, Lip-Prot-Amyl, (ZENPEP) 20000-63000 units CPEP Take 3 capsules by mouth 3 (three) times daily before meals.   pantoprazole (PROTONIX) 40 MG tablet TAKE 1 TABLET EVERY DAY FOR REFLUX   rosuvastatin (CRESTOR) 40 MG tablet Take 1 tablet (40 mg total)  by mouth daily.   senna (SENOKOT) 8.6 MG TABS tablet Take 2 tablets (17.2 mg total) by mouth in the morning and at bedtime. (Patient taking differently: Take 1 tablet by mouth in the morning and at bedtime.)   tamsulosin (FLOMAX) 0.4 MG CAPS capsule Take 2 capsules (0.8 mg total) by mouth every evening.   vitamin C (ASCORBIC ACID) 500 MG tablet Take 500 mg by mouth daily.   Past Medical History:   Diagnosis Date   AAA (abdominal aortic aneurysm) ([place])    followed by vasc surgery   Alcohol abuse    Barretts esophagus    bx distal esophagus 2011 neg    Bladder outlet obstruction 04/02/2018   CAD in native artery    a. s/p stent to prox RCA and mid Cx.   Cataract    Cholelithiasis    Chronic osteomyelitis involving ankle and foot, right ([place]) 07/13/2018   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   2006 - 3 adenomas largest 12 mm  2008 - no polyps 2013-  2 mm cecal polyp, adenoma, routine repeat colonoscopy 02/2017 approx 05/14/2017 18 polyps max 12 mm ALL adenomas recall 2019 [name], MD, [facility]      Dieulafoy lesion of duodenum    Diverticulosis of colon    DJD (degenerative joint disease)    left hip   Full dentures    upper and lower   GERD (gastroesophageal reflux disease)    diet controlled    Gout    feet   Hiatal hernia    2 cm noted on upper endos 2011    Hx of adenomatous colonic polyps    Hyperlipidemia    Hypertension    Internal hemorrhoid    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 pt was on Plavix x 3 years then transitioned to ASA, no problems since   Non-small cell carcinoma of right lung, stage 1 ([place]) 01/30/2016   Renal failure, acute ([facility])    hx of 2/2 medication, resolved   Tobacco abuse    smoker .5 ppd x 55 yrs   UTI (lower urinary tract infection)    Past Surgical History:  Procedure Laterality Date   BIOPSY  11/12/2020   Procedure: BIOPSY;  Surgeon: Doran Stabler, MD;  Location: [employer];  Service: Gastroenterology;;   BIOPSY  07/25/2021   Procedure: BIOPSY;  Surgeon: Daryel November, MD;  Location: [place];  Service: Gastroenterology;;   BIOPSY  12/24/2021   Procedure: BIOPSY;  Surgeon: Irving Copas., MD;  Location: [facility];  Service: Gastroenterology;;   BRONCHIAL BIOPSY  07/27/2021   Procedure:  BRONCHIAL BIOPSIES;  Surgeon: Collene Gobble, MD;  Location: [place];  Service: Cardiopulmonary;;   BRONCHIAL BRUSHINGS  07/27/2021   Procedure: BRONCHIAL BRUSHINGS;  Surgeon: Collene Gobble, MD;  Location: [facility] ENDOSCOPY;  Service: Cardiopulmonary;;   cataract surgery Bilateral 2018   removal of cataracts   COLONOSCOPY     hx polyps - [name] 02/2012   COLONOSCOPY WITH PROPOFOL N/A 11/12/2020   Procedure: COLONOSCOPY WITH PROPOFOL;  Surgeon: Doran Stabler, MD;  Location: [place];  Service: Gastroenterology;  Laterality: N/A;   CORONARY ANGIOPLASTY WITH STENT PLACEMENT     ESOPHAGOGASTRODUODENOSCOPY (EGD) WITH PROPOFOL N/A 11/12/2020   Procedure: ESOPHAGOGASTRODUODENOSCOPY (EGD) WITH PROPOFOL;  Surgeon: Doran Stabler, MD;  Location: [facility]  ENDOSCOPY;  Service: Gastroenterology;  Laterality: N/A;   ESOPHAGOGASTRODUODENOSCOPY (EGD) WITH PROPOFOL N/A 03/06/2021   Procedure: ESOPHAGOGASTRODUODENOSCOPY (EGD) WITH PROPOFOL;  Surgeon: Yetta Flock, MD;  Location: [facility];  Service: Gastroenterology;  Laterality: N/A;   ESOPHAGOGASTRODUODENOSCOPY (EGD) WITH PROPOFOL N/A 07/25/2021   Procedure: ESOPHAGOGASTRODUODENOSCOPY (EGD) WITH PROPOFOL;  Surgeon: Daryel November, MD;  Location: [place];  Service: Gastroenterology;  Laterality: N/A;   ESOPHAGOGASTRODUODENOSCOPY (EGD) WITH PROPOFOL N/A 12/24/2021   Procedure: ESOPHAGOGASTRODUODENOSCOPY (EGD) WITH PROPOFOL;  Surgeon: Rush Landmark Telford Nab., MD;  Location: [facility];  Service: Gastroenterology;  Laterality: N/A;   flexible cystourethroscopy  05/24/06   HEMOSTASIS CLIP PLACEMENT  03/06/2021   Procedure: HEMOSTASIS CLIP PLACEMENT;  Surgeon: Yetta Flock, MD;  Location: [facility];  Service: Gastroenterology;;   HEMOSTASIS CLIP PLACEMENT  12/24/2021   Procedure: HEMOSTASIS CLIP PLACEMENT;  Surgeon: Irving Copas., MD;  Location: [facility];  Service: Gastroenterology;;   HOT HEMOSTASIS N/A 03/06/2021    Procedure: HOT HEMOSTASIS (ARGON PLASMA COAGULATION/BICAP);  Surgeon: Yetta Flock, MD;  Location: [facility];  Service: Gastroenterology;  Laterality: N/A;   INGUINAL HERNIA REPAIR  2005   IR CATHETER TUBE CHANGE  07/08/2022   LUNG LOBECTOMY Right 11/07   RUL   MULTIPLE TOOTH EXTRACTIONS     POLYPECTOMY  11/12/2020   Procedure: POLYPECTOMY;  Surgeon: Doran Stabler, MD;  Location: [place];  Service: Gastroenterology;;   POLYPECTOMY  12/24/2021   Procedure: POLYPECTOMY;  Surgeon: Irving Copas., MD;  Location: [facility];  Service: Gastroenterology;;   SUBMUCOSAL LIFTING INJECTION  12/24/2021   Procedure: SUBMUCOSAL LIFTING INJECTION;  Surgeon: Irving Copas., MD;  Location: [facility];  Service: Gastroenterology;;   TRANSURETHRAL RESECTION OF PROSTATE N/A 05/02/2021   Procedure: TRANSURETHRAL RESECTION OF THE PROSTATE (TURP);  Surgeon: Lucas Mallow, MD;  Location: [facility];  Service: Urology;  Laterality: N/A;   UPPER ESOPHAGEAL ENDOSCOPIC ULTRASOUND (EUS) N/A 12/24/2021   Procedure: UPPER ESOPHAGEAL ENDOSCOPIC ULTRASOUND (EUS);  Surgeon: Irving Copas., MD;  Location: [facility];  Service: Gastroenterology;  Laterality: N/A;   UPPER GASTROINTESTINAL ENDOSCOPY     VIDEO BRONCHOSCOPY  07/27/2021   Procedure: VIDEO BRONCHOSCOPY WITHOUT FLUORO;  Surgeon: Collene Gobble, MD;  Location[facility]COPY;  Service: Cardiopulmonary;;   Social History   Social History Narrative   Patient is single reports no children   Smoker, no current alcohol or drug use   family history includes Arthritis in his brother; Diabetes in his mother and sister; Gout in his brother; Heart disease in his sister; Hypertension in his sister.   Review of Systems As above  Objective:   Physical Exam BP 124/60   Pulse 95   Ht 6' (1.829 m)   Wt 141 lb (64 kg)   BMI 19.12 kg/m  Thin elderly African-American man no acute distress eyes anicteric  25 minutes total  time on this visit before during and after patient interaction

## 2022-07-25 NOTE — Patient Instructions (Signed)
We will reach back out to you to set up a summer appointment.    Nice to see you today.   I appreciate the opportunity to care for you. Ronney Lion, [facility]

## 2022-08-07 ENCOUNTER — Other Ambulatory Visit: Payer: Self-pay

## 2022-08-07 DIAGNOSIS — M25552 Pain in left hip: Secondary | ICD-10-CM

## 2022-08-07 DIAGNOSIS — Z79891 Long term (current) use of opiate analgesic: Secondary | ICD-10-CM

## 2022-08-08 MED ORDER — HYDROCODONE-ACETAMINOPHEN 7.5-325 MG PO TABS: 1.0000 | ORAL_TABLET | Freq: Two times a day (BID) | ORAL | 0 refills | Status: DC | PRN

## 2022-08-08 MED ORDER — ALBUTEROL SULFATE HFA 108 (90 BASE) MCG/ACT IN AERS: 1.0000 | INHALATION_SPRAY | Freq: Four times a day (QID) | RESPIRATORY_TRACT | 2 refills | Status: DC | PRN

## 2022-08-13 NOTE — Pre-Procedure Instructions (Signed)
Surgical Instructions    Your procedure is scheduled on Friday, August 16, 2022 at 7:30 AM.  Report to [facility] Main Entrance "A" at 5:30 A.M., then check in with the Admitting office.  Call this number if you have problems the morning of surgery:  [phone]   If you have any questions prior to your surgery date call [phone]: Open Monday-Friday 8am-4pm  *If you experience any cold or flu symptoms such as cough, fever, chills, shortness of breath, etc. between now and your scheduled surgery, please notify us.*    Remember:  Do not eat or drink after midnight the night before your surgery    Take these medicines the morning of surgery with A SIP OF WATER:  acetaminophen (TYLENOL 8 HOUR)  amLODipine (NORVASC)  escitalopram (LEXAPRO)  metoprolol succinate (TOPROL-XL)  pantoprazole (PROTONIX)  rosuvastatin (CRESTOR)   IF NEEDED: albuterol (PROAIR HFA) - Bring with you day of surgery. HYDROcodone-acetaminophen (NORCO)   As of today, STOP taking any Aspirin (unless otherwise instructed by your surgeon) Aleve, Naproxen, Ibuprofen, Motrin, Advil, Goody's, BC's, all herbal medications, fish oil, and all vitamins.                     Do NOT Smoke (Tobacco/Vaping) for 24 hours prior to your procedure.  If you use a CPAP at night, you may bring your mask/headgear for your overnight stay.   Contacts, glasses, piercing's, hearing aid's, dentures or partials may not be worn into surgery, please bring cases for these belongings.    For patients admitted to the hospital, discharge time will be determined by your treatment team.   Patients discharged the day of surgery will not be allowed to drive home, and someone needs to stay with them for 24 hours.  SURGICAL WAITING ROOM VISITATION Patients having surgery or a procedure may have 2 support people in the waiting area. Visitors may stay in the waiting area during the procedure and switch out with other visitors if needed. Only 1  support person is allowed in the pre-op area with the patient AFTER the patient is prepped. This person cannot be switched out.  Children under the age of 71 must have an adult accompany them who is not the patient. If the patient needs to stay at the hospital during part of their recovery, the visitor guidelines for inpatient rooms apply.  Please refer to the [facility] website for the visitor guidelines for Inpatients (after your surgery is over and you are in a regular room).    Special instructions:   [place]- Preparing For Surgery  Before surgery, you can play an important role. Because skin is not sterile, your skin needs to be as free of germs as possible. You can reduce the number of germs on your skin by washing with CHG (chlorahexidine gluconate) Soap before surgery.  CHG is an antiseptic cleaner which kills germs and bonds with the skin to continue killing germs even after washing.    Oral Hygiene is also important to reduce your risk of infection.  Remember - BRUSH YOUR TEETH THE MORNING OF SURGERY WITH YOUR REGULAR TOOTHPASTE  Please do not use if you have an allergy to CHG or antibacterial soaps. If your skin becomes reddened/irritated stop using the CHG.  Do not shave (including legs and underarms) for at least 48 hours prior to first CHG shower. It is OK to shave your face.  Please follow these instructions carefully.   Shower the [employer] BEFORE SURGERY  and the MORNING OF SURGERY  If you chose to wash your hair, wash your hair first as usual with your normal shampoo.  After you shampoo, rinse your hair and body thoroughly to remove the shampoo.  Use CHG Soap as you would any other liquid soap. You can apply CHG directly to the skin and wash gently with a scrungie or a clean washcloth.   Apply the CHG Soap to your body ONLY FROM THE NECK DOWN (neck, arms, chest, abdomen, legs, and back).  Do not use on open wounds or open sores. Avoid contact with your eyes, ears, mouth  and genitals (private parts). Wash Face and genitals (private parts)  with your normal soap.   Wash thoroughly, paying special attention to the area where your surgery will be performed.  Thoroughly rinse your body with warm water from the neck down.  DO NOT shower/wash with your normal soap after using and rinsing off the CHG Soap.  Pat yourself dry with a CLEAN TOWEL.  Wear CLEAN PAJAMAS to bed the night before surgery  Place CLEAN SHEETS on your bed the night before your surgery  DO NOT SLEEP WITH PETS.   Day of Surgery: Take a shower with CHG soap. Do not wear lotions, powders, perfumes/colognes, or deodorant. Do not wear jewelry or makeup Do not shave 48 hours prior to surgery.  Men may shave face and neck. Do not wear nail polish, gel polish, artificial nails, or any other type of covering on natural nails (fingers and toes) If you have artificial nails or gel coating that need to be removed by a nail salon, please have this removed prior to surgery. Artificial nails or gel coating may interfere with anesthesia's ability to adequately monitor your vital signs. Wear Clean/Comfortable clothing the morning of surgery Do not bring valuables to the hospital.  [facility] is not responsible for any belongings or valuables. Remember to brush your teeth WITH YOUR REGULAR TOOTHPASTE.   Please read over the following fact sheets that you were given.  If you received a COVID test during your pre-op visit  it is requested that you wear a mask when out in public, stay away from anyone that may not be feeling well and notify your surgeon if you develop symptoms. If you have been in contact with anyone that has tested positive in the last 10 days please notify you surgeon.

## 2022-08-14 ENCOUNTER — Encounter (HOSPITAL_COMMUNITY)
Admission: RE | Admit: 2022-08-14 | Discharge: 2022-08-14 | Disposition: A | Discharge status: Home / Self Care | Payer: 59 | Source: Ambulatory Visit | Attending: Surgery | Admitting: Surgery

## 2022-08-14 ENCOUNTER — Other Ambulatory Visit: Payer: Self-pay

## 2022-08-14 ENCOUNTER — Encounter (HOSPITAL_COMMUNITY): Payer: Self-pay

## 2022-08-14 VITALS — BP 119/66 | HR 56 | Temp 98.0°F | Resp 17 | Ht 72.0 in | Wt 140.0 lb

## 2022-08-14 DIAGNOSIS — N400 Enlarged prostate without lower urinary tract symptoms: Secondary | ICD-10-CM | POA: Insufficient documentation

## 2022-08-14 DIAGNOSIS — Z86010 Personal history of colonic polyps: Secondary | ICD-10-CM | POA: Diagnosis not present

## 2022-08-14 DIAGNOSIS — J439 Emphysema, unspecified: Secondary | ICD-10-CM | POA: Insufficient documentation

## 2022-08-14 DIAGNOSIS — Z8249 Family history of ischemic heart disease and other diseases of the circulatory system: Secondary | ICD-10-CM | POA: Diagnosis not present

## 2022-08-14 DIAGNOSIS — I10 Essential (primary) hypertension: Secondary | ICD-10-CM | POA: Diagnosis not present

## 2022-08-14 DIAGNOSIS — I252 Old myocardial infarction: Secondary | ICD-10-CM | POA: Insufficient documentation

## 2022-08-14 DIAGNOSIS — D649 Anemia, unspecified: Secondary | ICD-10-CM | POA: Diagnosis not present

## 2022-08-14 DIAGNOSIS — N320 Bladder-neck obstruction: Secondary | ICD-10-CM | POA: Diagnosis not present

## 2022-08-14 DIAGNOSIS — I7142 Juxtarenal abdominal aortic aneurysm, without rupture: Secondary | ICD-10-CM | POA: Diagnosis not present

## 2022-08-14 DIAGNOSIS — Z01818 Encounter for other preprocedural examination: Secondary | ICD-10-CM

## 2022-08-14 DIAGNOSIS — E785 Hyperlipidemia, unspecified: Secondary | ICD-10-CM | POA: Diagnosis not present

## 2022-08-14 DIAGNOSIS — I700 Atherosclerosis of aorta: Secondary | ICD-10-CM | POA: Insufficient documentation

## 2022-08-14 DIAGNOSIS — K219 Gastro-esophageal reflux disease without esophagitis: Secondary | ICD-10-CM | POA: Diagnosis not present

## 2022-08-14 DIAGNOSIS — I2510 Atherosclerotic heart disease of native coronary artery without angina pectoris: Secondary | ICD-10-CM | POA: Insufficient documentation

## 2022-08-14 DIAGNOSIS — I714 Abdominal aortic aneurysm, without rupture, unspecified: Secondary | ICD-10-CM

## 2022-08-14 DIAGNOSIS — I517 Cardiomegaly: Secondary | ICD-10-CM | POA: Insufficient documentation

## 2022-08-14 DIAGNOSIS — F17210 Nicotine dependence, cigarettes, uncomplicated: Secondary | ICD-10-CM | POA: Diagnosis not present

## 2022-08-14 DIAGNOSIS — Z86718 Personal history of other venous thrombosis and embolism: Secondary | ICD-10-CM | POA: Insufficient documentation

## 2022-08-14 DIAGNOSIS — Z833 Family history of diabetes mellitus: Secondary | ICD-10-CM | POA: Diagnosis not present

## 2022-08-14 DIAGNOSIS — Z8261 Family history of arthritis: Secondary | ICD-10-CM | POA: Diagnosis not present

## 2022-08-14 DIAGNOSIS — I7143 Infrarenal abdominal aortic aneurysm, without rupture: Secondary | ICD-10-CM | POA: Insufficient documentation

## 2022-08-14 DIAGNOSIS — R001 Bradycardia, unspecified: Secondary | ICD-10-CM | POA: Diagnosis not present

## 2022-08-14 DIAGNOSIS — Z681 Body mass index (BMI) 19 or less, adult: Secondary | ICD-10-CM | POA: Diagnosis not present

## 2022-08-14 DIAGNOSIS — K2270 Barrett's esophagus without dysplasia: Secondary | ICD-10-CM | POA: Diagnosis not present

## 2022-08-14 DIAGNOSIS — Z01812 Encounter for preprocedural laboratory examination: Secondary | ICD-10-CM | POA: Insufficient documentation

## 2022-08-14 DIAGNOSIS — E43 Unspecified severe protein-calorie malnutrition: Secondary | ICD-10-CM | POA: Diagnosis not present

## 2022-08-14 DIAGNOSIS — T82310A Breakdown (mechanical) of aortic (bifurcation) graft (replacement), initial encounter: Secondary | ICD-10-CM | POA: Diagnosis not present

## 2022-08-14 DIAGNOSIS — Z87440 Personal history of urinary (tract) infections: Secondary | ICD-10-CM | POA: Diagnosis not present

## 2022-08-14 DIAGNOSIS — D696 Thrombocytopenia, unspecified: Secondary | ICD-10-CM | POA: Diagnosis not present

## 2022-08-14 DIAGNOSIS — Z85118 Personal history of other malignant neoplasm of bronchus and lung: Secondary | ICD-10-CM | POA: Diagnosis not present

## 2022-08-14 DIAGNOSIS — I771 Stricture of artery: Secondary | ICD-10-CM | POA: Diagnosis not present

## 2022-08-14 DIAGNOSIS — I452 Bifascicular block: Secondary | ICD-10-CM | POA: Diagnosis not present

## 2022-08-14 LAB — COMPREHENSIVE METABOLIC PANEL
ALT: 16 U/L (ref 0–44)
AST: 24 U/L (ref 15–41)
Albumin: 3.3 g/dL — ABNORMAL LOW (ref 3.5–5.0)
Alkaline Phosphatase: 76 U/L (ref 38–126)
Anion gap: 10 (ref 5–15)
BUN: 11 mg/dL (ref 8–23)
CO2: 24 mmol/L (ref 22–32)
Calcium: 8.9 mg/dL (ref 8.9–10.3)
Chloride: 107 mmol/L (ref 98–111)
Creatinine, Ser: 1.21 mg/dL (ref 0.61–1.24)
GFR, Estimated: >60 mL/min (ref >60)
Glucose, Bld: 93 mg/dL (ref 70–99)
Potassium: 3.6 mmol/L (ref 3.5–5.1)
Sodium: 141 mmol/L (ref 135–145)
Total Bilirubin: 0.8 mg/dL (ref 0.3–1.2)
Total Protein: 7.1 g/dL (ref 6.5–8.1)

## 2022-08-14 LAB — URINALYSIS, MICROSCOPIC (REFLEX)

## 2022-08-14 LAB — URINALYSIS, ROUTINE W REFLEX MICROSCOPIC
Bilirubin Urine: NEGATIVE
Glucose, UA: NEGATIVE mg/dL
Ketones, ur: NEGATIVE mg/dL
Nitrite: POSITIVE — AB
Protein, ur: NEGATIVE mg/dL
Specific Gravity, Urine: 1.015 (ref 1.005–1.030)
pH: 7.5 (ref 5.0–8.0)

## 2022-08-14 LAB — CBC
HCT: 35.6 % — ABNORMAL LOW (ref 39.0–52.0)
Hemoglobin: 10.9 g/dL — ABNORMAL LOW (ref 13.0–17.0)
MCH: 29.2 pg (ref 26.0–34.0)
MCHC: 30.6 g/dL (ref 30.0–36.0)
MCV: 95.4 fL (ref 80.0–100.0)
Platelets: 139 10*3/uL — ABNORMAL LOW (ref 150–400)
RBC: 3.73 MIL/uL — ABNORMAL LOW (ref 4.22–5.81)
RDW: 16.6 % — ABNORMAL HIGH (ref 11.5–15.5)
WBC: 4.3 10*3/uL (ref 4.0–10.5)
nRBC: 0 % (ref 0.0–0.2)

## 2022-08-14 LAB — PROTIME-INR
INR: 1.1 (ref 0.8–1.2)
Prothrombin Time: 14 seconds (ref 11.4–15.2)

## 2022-08-14 LAB — SURGICAL PCR SCREEN
MRSA, PCR: POSITIVE — AB
Staphylococcus aureus: POSITIVE — AB

## 2022-08-14 LAB — APTT: aPTT: 36 seconds (ref 24–36)

## 2022-08-14 NOTE — Progress Notes (Signed)
Anesthesia Chart Review:  Previous cardiovascular history of CAD with a remote NSTEMI in 2004, drug-eluting stent to proximal RCA and staged PCI to mid circumflex.  Moderate residual disease in left circumflex.  EF 60%.  In 2016 seen in follow-up with nonspecific IVCD as well as occasional PAC.  Subsequent EKGs during other admissions with degree of conduction abnormality with episodic bradycardia and 2020 EKG revealing right bundle branch block/LAFB pattern. TTE May 2022 revealed EF 55-60%. Myoview 2022 normal.       Nuclear stress 12/19/20: Nuclear stress EF: 47%. The left ventricular ejection fraction is mildly decreased (45-54%). There was no ST segment deviation noted during stress. No T wave inversion was noted during stress. The study is normal. This is a low risk study.  Event monitor 11/29/20: Patch wear time was 2 days and 4 hours Monitor shows probable Afib with some possible episodes of non-conducted PACs Monitor also comments on 27 episodes of VT, however, review of the strips suggests possible accelerated idoventricular rhythm as rate is on the lower end for VT No significant pauses.   1.  Reduced apical counts with normal wall motion consistent with apical thinning artifact.  No evidence of ischemia or infarction. 2.  Normal LVEF for this modality, 47%. 3.  This is a low risk study.  CTA 05/29/22: IMPRESSION: VASCULAR   1. Continued enlargement of previously visualized fusiform infrarenal abdominal aortic aneurysm now measuring up to 7.9 cm, previously 7.2 cm. Recommend referral to a vascular specialist if not already obtained. This recommendation follows ACR consensus guidelines: White Paper of the ACR Incidental Findings Committee II on Vascular Findings. J Am Coll Radiol 2013; 10:789-794. 2. Poor opacification of the inflow vessels due to contrast bolus timing in likely a degree of diminished cardiac output. Similar appearing diffuse tortuosity and aneurysmal  dilation of the right common iliac artery measuring up to approximately 2.0 cm. 3.  Aortic Atherosclerosis (ICD10-I70.0). 4. Biatrial cardiomegaly.   NON-VASCULAR   1. No acute abdominopelvic abnormality. 2.  Emphysema (ICD10-J43.9). 3. Prostatomegaly.

## 2022-08-14 NOTE — Progress Notes (Signed)
Patient Nasal PCR positive for MRSA. Nurse with Dr. Stephens Shire office notified.

## 2022-08-14 NOTE — Progress Notes (Addendum)
PCP -    Axel Filler, MD   Cardiologist - Freada Bergeron, MD   PPM/ICD - denies   Chest x-ray - denies EKG - 08/14/2022  Stress Test - 12/19/20 ECHO - 11/06/20 Cardiac Cath - 2004  Sleep Study - denies  Last dose of GLP1 agonist-   N/A   Blood Thinner Instructions: N/A Aspirin Instructions:N/A  ERAS Protcol - NPO order   COVID TEST- N/A   Anesthesia review: cardiac hx, EKG HR 49- Pt denies chest pain, dizziness, fatigue, shortness of breath or other cardiac symptoms. Karoline Caldwell, PA notified of patient during PAT appointment d/t surgery on 08/16/22.   Patient denies shortness of breath, fever, cough and chest pain at PAT appointment   All instructions explained to the patient, with a verbal understanding of the material. Patient agrees to go over the instructions while at home for a better understanding. The opportunity to ask questions was provided.

## 2022-08-14 NOTE — Progress Notes (Signed)
Patient's urine sample collected from suprapubic catheter. Site cleaned prior to collection.

## 2022-08-15 NOTE — Anesthesia Preprocedure Evaluation (Addendum)
Anesthesia Evaluation  Patient identified by MRN, date of birth, ID band Patient awake    Reviewed: Allergy & Precautions, H&P , NPO status , Patient's Chart, lab work & pertinent test results  Airway Mallampati: II  TM Distance: >3 FB Neck ROM: Full    Dental no notable dental hx.    Pulmonary COPD, Current Smoker   Pulmonary exam normal breath sounds clear to auscultation       Cardiovascular hypertension, + CAD, + Past MI, + Cardiac Stents and + Peripheral Vascular Disease  Normal cardiovascular exam Rhythm:Regular Rate:Normal     Neuro/Psych negative neurological ROS  negative psych ROS   GI/Hepatic ,GERD  ,,(+)     substance abuse  alcohol use  Endo/Other  negative endocrine ROS    Renal/GU negative Renal ROS  negative genitourinary   Musculoskeletal negative musculoskeletal ROS (+)    Abdominal   Peds negative pediatric ROS (+)  Hematology negative hematology ROS (+)   Anesthesia Other Findings   Reproductive/Obstetrics negative OB ROS                             Anesthesia Physical Anesthesia Plan  ASA: 3  Anesthesia Plan: General   Post-op Pain Management: Ofirmev IV (intra-op)*   Induction: Intravenous  PONV Risk Score and Plan: 1 and Ondansetron and Treatment may vary due to age or medical condition  Airway Management Planned: Oral ETT  Additional Equipment: Arterial line  Intra-op Plan:   Post-operative Plan: Extubation in OR  Informed Consent: I have reviewed the patients History and Physical, chart, labs and discussed the procedure including the risks, benefits and alternatives for the proposed anesthesia with the patient or authorized representative who has indicated his/her understanding and acceptance.     Dental advisory given  Plan Discussed with: CRNA and Surgeon  Anesthesia Plan Comments: (PAT note by Karoline Caldwell, PA-C:  Follows with cardiology  for history of CAD with a remote NSTEMI in 2004 (treated with DES to proximal RCA and staged PCI to mid circumflex), asymptomatic bradycardia, PACs, known LAFB/RBBB. TTE May 2022 revealed EF55-60%.Myoview 2022 normal.Last seen by Finis Bud, NP on 04/22/22. Pt was recommended to decrease Toprol-XL to 50 mg daily due to prior history of bradycardia.  He was noted to be stable with no anginal symptoms and no indication for ischemic evaluation.  3-4 month followup recommended.   Follows with vascular surgery for history of juxtarenal abdominal aortic aneurysm.  He also has history of nonobstructive portal vein thrombosis not on AC due to high risk for bleeding.  Recent CTA showed continued enlargement of infrarenal abdominal aortic aneurysm now measuring up to 7.9 cm, previously 7.2 cm.  He was seen by Dr. Trula Slade on 07/15/2022 and it was discussed that he was at significant risk for rupture with nonsurgical treatment.  He was felt to be a poor candidate for open surgical repair due to presence of indwelling suprapubic catheter and therefore an endovascular approach was recommended.  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   Follows with pulmonology for history of emphysema and squamous cell lung cancer treated surgically in 2007 with right upper lobectomy.  Chest CT 07/25/2021 showed anterior nodularity along the right mainstem bronchus. Patient underwent a bronchoscopy that showed protrusion of the airway medially and to the right mainstem bronchus due to previous surgical resection. Endobronchial brushings and biopsies were negative for malignant cells. Follow-up  CT chest 01/31/2022 showed emphysema, status post right upper lobectomy, mild chronic scars at the lateral right lower lobe, no acute findings or suspicious nodules.  Last seen by Rexene Edison, NP on 03/05/2022.  Per note, minimal pulmonary symptom burden, continues to smoke,  recommended to use albuterol as needed.  Labs reviewed, mild anemia with hemoglobin 10.9, mild thrombocytopenia with platelets 139k.  EKG 08/14/2022: Sinus bradycardia with sinus arrhythmia with 1st degree A-V block.  Rate 49. Left axis deviation. Non-specific intra-ventricular conduction block. Cannot rule out Anteroseptal infarct , age undetermined  CTA abdomen pelvis 05/29/2022: IMPRESSION: VASCULAR  1. Continued enlargement of previously visualized fusiform infrarenal abdominal aortic aneurysm now measuring up to 7.9 cm, previously 7.2 cm. Recommend referral to a vascular specialist if not already obtained. This recommendation follows ACR consensus guidelines: White Paper of the ACR Incidental Findings Committee II on Vascular Findings. J Am Coll Radiol 2013; 10:789-794. 2. Poor opacification of the inflow vessels due to contrast bolus timing in likely a degree of diminished cardiac output. 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 3. Aortic Atherosclerosis (ICD10-I70.0). 4. Biatrial cardiomegaly.  NON-VASCULAR  1. No acute abdominopelvic abnormality. 2. Emphysema (ICD10-J43.9). 3. Prostatomegaly.  CT chest 01/31/2022: IMPRESSION: 1. No acute findings within the chest. 2. No lung mass or nodule. No evidence of malignancy. 3. Dense three-vessel coronary artery calcifications. 4. Dilated ascending thoracic aorta to 4.2 cm with aortic atherosclerosis. Recommend annual imaging followup by CTA or MRA. This recommendation follows 2010 ACCF/AHA/AATS/ACR/ASA/SCA/SCAI/SIR/STS/SVM Guidelines for the Diagnosis and Management of Patients with Thoracic Aortic Disease. Circulation. 2010; [id number]. Aortic aneurysm NOS (ICD10-I71.9) 5. Emphysema.  Nuclear stress 12/19/20: ? Nuclear stress EF: 47%. ? The left ventricular ejection fraction is mildly decreased (45-54%). ? There was no ST  segment deviation noted during stress. ? No T wave inversion was noted during stress. ? The study is normal. ? This is a low risk study.  Event monitor 11/29/20: ? Patch wear time was 2 days and 4 hours ? Monitor shows probable Afib with some possible episodes of non-conducted PACs ? Monitor also comments on 27 episodes of VT, however, review of the strips suggests possible accelerated idoventricular rhythm as rate is on the lower end for VT ? No significant pauses.  1. Reduced apical counts with normal wall motion consistent with apical thinning artifact. No evidence of ischemia or infarction. 2. Normal LVEF for this modality, 47%. 3. This is a low risk study.  CTA 05/29/22: IMPRESSION: VASCULAR  1. Continued enlargement of previously visualized fusiform infrarenal abdominal aortic aneurysm now measuring up to 7.9 cm, previously 7.2 cm. Recommend referral to a vascular specialist if not already obtained. This recommendation follows ACR consensus guidelines: White Paper of the ACR Incidental Findings Committee II on Vascular Findings. J Am Coll Radiol 2013; 10:789-794. 2. Poor opacification of the inflow vessels due to contrast bolus timing in likely a degree of diminished cardiac output. 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 3. Aortic Atherosclerosis (ICD10-I70.0). 4. Biatrial cardiomegaly.  NON-VASCULAR  1. No acute abdominopelvic abnormality. 2. Emphysema (ICD10-J43.9). 3. Prostatomegaly.   )        Anesthesia Quick Evaluation

## 2022-08-16 ENCOUNTER — Inpatient Hospital Stay (HOSPITAL_COMMUNITY): Payer: 59

## 2022-08-16 ENCOUNTER — Inpatient Hospital Stay (HOSPITAL_COMMUNITY): Payer: 59 | Admitting: Anesthesiology

## 2022-08-16 ENCOUNTER — Other Ambulatory Visit: Payer: Self-pay

## 2022-08-16 ENCOUNTER — Encounter (HOSPITAL_COMMUNITY): Payer: Self-pay | Admitting: Surgery

## 2022-08-16 ENCOUNTER — Inpatient Hospital Stay (HOSPITAL_COMMUNITY)
Admission: RE | Admit: 2022-08-16 | Discharge: 2022-08-18 | DRG: 268 | Disposition: A | Discharge status: Home / Self Care | Payer: 59 | Attending: Surgery | Admitting: Surgery

## 2022-08-16 ENCOUNTER — Encounter (HOSPITAL_COMMUNITY)
Admission: RE | Disposition: A | Discharge status: Home / Self Care | Payer: Self-pay | Source: Home / Self Care | Attending: Surgery

## 2022-08-16 ENCOUNTER — Inpatient Hospital Stay (HOSPITAL_COMMUNITY): Payer: 59 | Admitting: Physician Assistant

## 2022-08-16 DIAGNOSIS — Z79899 Other long term (current) drug therapy: Secondary | ICD-10-CM

## 2022-08-16 DIAGNOSIS — J439 Emphysema, unspecified: Secondary | ICD-10-CM | POA: Diagnosis present

## 2022-08-16 DIAGNOSIS — I7142 Juxtarenal abdominal aortic aneurysm, without rupture: Secondary | ICD-10-CM

## 2022-08-16 DIAGNOSIS — Z8249 Family history of ischemic heart disease and other diseases of the circulatory system: Secondary | ICD-10-CM

## 2022-08-16 DIAGNOSIS — Z85118 Personal history of other malignant neoplasm of bronchus and lung: Secondary | ICD-10-CM

## 2022-08-16 DIAGNOSIS — E43 Unspecified severe protein-calorie malnutrition: Secondary | ICD-10-CM | POA: Diagnosis not present

## 2022-08-16 DIAGNOSIS — D649 Anemia, unspecified: Secondary | ICD-10-CM | POA: Diagnosis present

## 2022-08-16 DIAGNOSIS — K219 Gastro-esophageal reflux disease without esophagitis: Secondary | ICD-10-CM | POA: Diagnosis present

## 2022-08-16 DIAGNOSIS — D696 Thrombocytopenia, unspecified: Secondary | ICD-10-CM | POA: Diagnosis not present

## 2022-08-16 DIAGNOSIS — Z681 Body mass index (BMI) 19 or less, adult: Secondary | ICD-10-CM | POA: Diagnosis not present

## 2022-08-16 DIAGNOSIS — Z91013 Allergy to seafood: Secondary | ICD-10-CM

## 2022-08-16 DIAGNOSIS — Y712 Prosthetic and other implants, materials and accessory cardiovascular devices associated with adverse incidents: Secondary | ICD-10-CM | POA: Diagnosis present

## 2022-08-16 DIAGNOSIS — F17210 Nicotine dependence, cigarettes, uncomplicated: Secondary | ICD-10-CM | POA: Diagnosis present

## 2022-08-16 DIAGNOSIS — I10 Essential (primary) hypertension: Secondary | ICD-10-CM | POA: Diagnosis not present

## 2022-08-16 DIAGNOSIS — F17200 Nicotine dependence, unspecified, uncomplicated: Secondary | ICD-10-CM | POA: Diagnosis not present

## 2022-08-16 DIAGNOSIS — I2510 Atherosclerotic heart disease of native coronary artery without angina pectoris: Secondary | ICD-10-CM

## 2022-08-16 DIAGNOSIS — R001 Bradycardia, unspecified: Secondary | ICD-10-CM | POA: Diagnosis present

## 2022-08-16 DIAGNOSIS — I452 Bifascicular block: Secondary | ICD-10-CM | POA: Diagnosis not present

## 2022-08-16 DIAGNOSIS — I252 Old myocardial infarction: Secondary | ICD-10-CM

## 2022-08-16 DIAGNOSIS — N320 Bladder-neck obstruction: Secondary | ICD-10-CM | POA: Diagnosis present

## 2022-08-16 DIAGNOSIS — Z8546 Personal history of malignant neoplasm of prostate: Secondary | ICD-10-CM | POA: Diagnosis not present

## 2022-08-16 DIAGNOSIS — Z87440 Personal history of urinary (tract) infections: Secondary | ICD-10-CM | POA: Diagnosis not present

## 2022-08-16 DIAGNOSIS — T82310A Breakdown (mechanical) of aortic (bifurcation) graft (replacement), initial encounter: Secondary | ICD-10-CM | POA: Diagnosis not present

## 2022-08-16 DIAGNOSIS — E785 Hyperlipidemia, unspecified: Secondary | ICD-10-CM | POA: Diagnosis present

## 2022-08-16 DIAGNOSIS — Z9861 Coronary angioplasty status: Secondary | ICD-10-CM

## 2022-08-16 DIAGNOSIS — J449 Chronic obstructive pulmonary disease, unspecified: Secondary | ICD-10-CM | POA: Diagnosis not present

## 2022-08-16 DIAGNOSIS — I771 Stricture of artery: Secondary | ICD-10-CM | POA: Diagnosis present

## 2022-08-16 DIAGNOSIS — Z86010 Personal history of colonic polyps: Secondary | ICD-10-CM

## 2022-08-16 DIAGNOSIS — Z888 Allergy status to other drugs, medicaments and biological substances status: Secondary | ICD-10-CM

## 2022-08-16 DIAGNOSIS — Z955 Presence of coronary angioplasty implant and graft: Secondary | ICD-10-CM

## 2022-08-16 DIAGNOSIS — Z01818 Encounter for other preprocedural examination: Secondary | ICD-10-CM

## 2022-08-16 DIAGNOSIS — Z8679 Personal history of other diseases of the circulatory system: Principal | ICD-10-CM

## 2022-08-16 DIAGNOSIS — Z833 Family history of diabetes mellitus: Secondary | ICD-10-CM

## 2022-08-16 DIAGNOSIS — Z8261 Family history of arthritis: Secondary | ICD-10-CM

## 2022-08-16 DIAGNOSIS — R918 Other nonspecific abnormal finding of lung field: Secondary | ICD-10-CM | POA: Diagnosis not present

## 2022-08-16 DIAGNOSIS — K2270 Barrett's esophagus without dysplasia: Secondary | ICD-10-CM | POA: Diagnosis present

## 2022-08-16 HISTORY — PX: ENDOVASCULAR STENT INSERTION: SHX5161

## 2022-08-16 HISTORY — PX: ULTRASOUND GUIDANCE FOR VASCULAR ACCESS: SHX6516

## 2022-08-16 HISTORY — PX: ABDOMINAL AORTIC ENDOVASCULAR FENESTRATED STENT GRAFT: SHX6430

## 2022-08-16 HISTORY — PX: INSERTION OF ILIAC STENT: SHX6256

## 2022-08-16 SURGERY — ABDOMINAL AORTIC ENDOVASCULAR FENESTRATED STENT GRAFT
Anesthesia: General | Site: Groin

## 2022-08-16 MED ORDER — PROPOFOL 10 MG/ML IV BOLUS
INTRAVENOUS | Status: DC | PRN
  Administered 2022-08-16: 80 mg via INTRAVENOUS
  Administered 2022-08-16: 20 mg via INTRAVENOUS

## 2022-08-16 MED ORDER — SUGAMMADEX SODIUM 200 MG/2ML IV SOLN
INTRAVENOUS | Status: DC | PRN
  Administered 2022-08-16: 120 mg via INTRAVENOUS

## 2022-08-16 MED ORDER — LIDOCAINE 2% (20 MG/ML) 5 ML SYRINGE
INTRAMUSCULAR | Status: AC
  Filled 2022-08-16: qty 5

## 2022-08-16 MED ORDER — FENTANYL CITRATE (PF) 250 MCG/5ML IJ SOLN
INTRAMUSCULAR | Status: AC
  Filled 2022-08-16: qty 5

## 2022-08-16 MED ORDER — SODIUM CHLORIDE 0.9 % IV SOLN: INTRAVENOUS | Status: DC

## 2022-08-16 MED ORDER — SUCCINYLCHOLINE CHLORIDE 200 MG/10ML IV SOSY
PREFILLED_SYRINGE | INTRAVENOUS | Status: DC | PRN
  Administered 2022-08-16: 100 mg via INTRAVENOUS

## 2022-08-16 MED ORDER — HEPARIN SODIUM (PORCINE) 1000 UNIT/ML IJ SOLN
INTRAMUSCULAR | Status: DC | PRN
  Administered 2022-08-16: 2000 [IU] via INTRAVENOUS
  Administered 2022-08-16: 7000 [IU] via INTRAVENOUS
  Administered 2022-08-16: 2000 [IU] via INTRAVENOUS
  Administered 2022-08-16 (×4): 1000 [IU] via INTRAVENOUS

## 2022-08-16 MED ORDER — OXYCODONE HCL 5 MG/5ML PO SOLN: 5.0000 mg | Freq: Once | ORAL | Status: DC | PRN

## 2022-08-16 MED ORDER — CHLORHEXIDINE GLUCONATE CLOTH 2 % EX PADS: 6.0000 | MEDICATED_PAD | Freq: Once | CUTANEOUS | Status: DC

## 2022-08-16 MED ORDER — MIRTAZAPINE 15 MG PO TABS
7.5000 mg | ORAL_TABLET | Freq: Every day | ORAL | Status: DC
  Administered 2022-08-16 – 2022-08-17 (×2): 7.5 mg via ORAL
  Filled 2022-08-16 (×2): qty 1

## 2022-08-16 MED ORDER — ESCITALOPRAM OXALATE 10 MG PO TABS
5.0000 mg | ORAL_TABLET | Freq: Every day | ORAL | Status: DC
  Administered 2022-08-17 – 2022-08-18 (×2): 5 mg via ORAL
  Filled 2022-08-16 (×2): qty 1

## 2022-08-16 MED ORDER — OXYCODONE HCL 5 MG PO TABS: 5.0000 mg | ORAL_TABLET | Freq: Once | ORAL | Status: DC | PRN

## 2022-08-16 MED ORDER — DEXAMETHASONE SODIUM PHOSPHATE 10 MG/ML IJ SOLN
INTRAMUSCULAR | Status: DC | PRN
  Administered 2022-08-16: 5 mg via INTRAVENOUS

## 2022-08-16 MED ORDER — ATROPINE SULFATE 0.4 MG/ML IV SOLN
INTRAVENOUS | Status: DC | PRN
  Administered 2022-08-16 (×2): .4 mg via INTRAVENOUS

## 2022-08-16 MED ORDER — PHENYLEPHRINE 80 MCG/ML (10ML) SYRINGE FOR IV PUSH (FOR BLOOD PRESSURE SUPPORT)
PREFILLED_SYRINGE | INTRAVENOUS | Status: AC
  Filled 2022-08-16: qty 10

## 2022-08-16 MED ORDER — CHLORHEXIDINE GLUCONATE 0.12 % MT SOLN
15.0000 mL | Freq: Once | OROMUCOSAL | Status: DC
  Filled 2022-08-16: qty 15

## 2022-08-16 MED ORDER — POTASSIUM CHLORIDE CRYS ER 20 MEQ PO TBCR
20.0000 meq | EXTENDED_RELEASE_TABLET | Freq: Every day | ORAL | Status: AC | PRN
  Administered 2022-08-17: 40 meq via ORAL
  Filled 2022-08-16: qty 2

## 2022-08-16 MED ORDER — GUAIFENESIN-DM 100-10 MG/5ML PO SYRP: 15.0000 mL | ORAL_SOLUTION | ORAL | Status: DC | PRN

## 2022-08-16 MED ORDER — MORPHINE SULFATE (PF) 2 MG/ML IV SOLN
2.0000 mg | INTRAVENOUS | Status: DC | PRN
  Administered 2022-08-16: 4 mg via INTRAVENOUS
  Filled 2022-08-16: qty 2

## 2022-08-16 MED ORDER — ROCURONIUM BROMIDE 10 MG/ML (PF) SYRINGE
PREFILLED_SYRINGE | INTRAVENOUS | Status: AC
  Filled 2022-08-16: qty 10

## 2022-08-16 MED ORDER — HEPARIN 6000 UNIT IRRIGATION SOLUTION
Status: AC
  Filled 2022-08-16: qty 500

## 2022-08-16 MED ORDER — CEFAZOLIN SODIUM-DEXTROSE 2-4 GM/100ML-% IV SOLN
2.0000 g | Freq: Three times a day (TID) | INTRAVENOUS | Status: AC
  Administered 2022-08-16 – 2022-08-17 (×2): 2 g via INTRAVENOUS
  Filled 2022-08-16 (×2): qty 100

## 2022-08-16 MED ORDER — ROCURONIUM BROMIDE 10 MG/ML (PF) SYRINGE
PREFILLED_SYRINGE | INTRAVENOUS | Status: DC | PRN
  Administered 2022-08-16: 20 mg via INTRAVENOUS
  Administered 2022-08-16: 40 mg via INTRAVENOUS
  Administered 2022-08-16 (×2): 10 mg via INTRAVENOUS
  Administered 2022-08-16: 40 mg via INTRAVENOUS

## 2022-08-16 MED ORDER — VANCOMYCIN HCL IN DEXTROSE 1-5 GM/200ML-% IV SOLN
INTRAVENOUS | Status: AC
  Filled 2022-08-16: qty 200

## 2022-08-16 MED ORDER — IODIXANOL 320 MG/ML IV SOLN
INTRAVENOUS | Status: DC | PRN
  Administered 2022-08-16: 13 mL via INTRA_ARTERIAL
  Administered 2022-08-16: 101.28 mL via INTRA_ARTERIAL

## 2022-08-16 MED ORDER — PANTOPRAZOLE SODIUM 40 MG PO TBEC
40.0000 mg | DELAYED_RELEASE_TABLET | Freq: Every day | ORAL | Status: DC
  Administered 2022-08-16 – 2022-08-18 (×3): 40 mg via ORAL
  Filled 2022-08-16 (×3): qty 1

## 2022-08-16 MED ORDER — AMLODIPINE BESYLATE 10 MG PO TABS
10.0000 mg | ORAL_TABLET | Freq: Every day | ORAL | Status: DC
  Administered 2022-08-17 – 2022-08-18 (×2): 10 mg via ORAL
  Filled 2022-08-16 (×2): qty 1

## 2022-08-16 MED ORDER — ORAL CARE MOUTH RINSE: 15.0000 mL | Freq: Once | OROMUCOSAL | Status: DC

## 2022-08-16 MED ORDER — SODIUM CHLORIDE 0.9 % IV SOLN: INTRAVENOUS | Status: AC

## 2022-08-16 MED ORDER — ONDANSETRON HCL 4 MG/2ML IJ SOLN
INTRAMUSCULAR | Status: AC
  Filled 2022-08-16: qty 2

## 2022-08-16 MED ORDER — PROTAMINE SULFATE 10 MG/ML IV SOLN
INTRAVENOUS | Status: DC | PRN
  Administered 2022-08-16: 50 mg via INTRAVENOUS

## 2022-08-16 MED ORDER — ONDANSETRON HCL 4 MG/2ML IJ SOLN: 4.0000 mg | Freq: Once | INTRAMUSCULAR | Status: DC | PRN

## 2022-08-16 MED ORDER — ORAL CARE MOUTH RINSE: 15.0000 mL | OROMUCOSAL | Status: DC | PRN

## 2022-08-16 MED ORDER — VANCOMYCIN HCL IN DEXTROSE 1-5 GM/200ML-% IV SOLN
1000.0000 mg | Freq: Once | INTRAVENOUS | Status: AC
  Administered 2022-08-16: 1000 mg via INTRAVENOUS

## 2022-08-16 MED ORDER — ONDANSETRON HCL 4 MG/2ML IJ SOLN: 4.0000 mg | Freq: Four times a day (QID) | INTRAMUSCULAR | Status: DC | PRN

## 2022-08-16 MED ORDER — LIDOCAINE 2% (20 MG/ML) 5 ML SYRINGE
INTRAMUSCULAR | Status: DC | PRN
  Administered 2022-08-16: 60 mg via INTRAVENOUS

## 2022-08-16 MED ORDER — FENTANYL CITRATE (PF) 100 MCG/2ML IJ SOLN: 25.0000 ug | INTRAMUSCULAR | Status: DC | PRN

## 2022-08-16 MED ORDER — CLOPIDOGREL BISULFATE 75 MG PO TABS
75.0000 mg | ORAL_TABLET | Freq: Every day | ORAL | Status: DC
  Administered 2022-08-16 – 2022-08-18 (×3): 75 mg via ORAL
  Filled 2022-08-16 (×3): qty 1

## 2022-08-16 MED ORDER — MAGNESIUM SULFATE 2 GM/50ML IV SOLN: 2.0000 g | Freq: Every day | INTRAVENOUS | Status: DC | PRN

## 2022-08-16 MED ORDER — ONDANSETRON HCL 4 MG/2ML IJ SOLN
INTRAMUSCULAR | Status: DC | PRN
  Administered 2022-08-16: 4 mg via INTRAVENOUS

## 2022-08-16 MED ORDER — EPINEPHRINE 1 MG/10ML IJ SOSY
PREFILLED_SYRINGE | INTRAMUSCULAR | Status: AC
  Filled 2022-08-16: qty 10

## 2022-08-16 MED ORDER — PHENOL 1.4 % MT LIQD: 1.0000 | OROMUCOSAL | Status: DC | PRN

## 2022-08-16 MED ORDER — FENTANYL CITRATE (PF) 250 MCG/5ML IJ SOLN: INTRAMUSCULAR | Status: DC | PRN

## 2022-08-16 MED ORDER — PHENYLEPHRINE HCL-NACL 20-0.9 MG/250ML-% IV SOLN
INTRAVENOUS | Status: DC | PRN
  Administered 2022-08-16: 15 ug/min via INTRAVENOUS

## 2022-08-16 MED ORDER — SUCCINYLCHOLINE CHLORIDE 200 MG/10ML IV SOSY
PREFILLED_SYRINGE | INTRAVENOUS | Status: AC
  Filled 2022-08-16: qty 10

## 2022-08-16 MED ORDER — DOCUSATE SODIUM 100 MG PO CAPS
100.0000 mg | ORAL_CAPSULE | Freq: Every day | ORAL | Status: DC
  Administered 2022-08-17 – 2022-08-18 (×2): 100 mg via ORAL
  Filled 2022-08-16 (×2): qty 1

## 2022-08-16 MED ORDER — SODIUM CHLORIDE 0.9 % IV SOLN: 500.0000 mL | Freq: Once | INTRAVENOUS | Status: DC | PRN

## 2022-08-16 MED ORDER — ATROPINE SULFATE 0.4 MG/ML IV SOLN
INTRAVENOUS | Status: AC
  Filled 2022-08-16: qty 1

## 2022-08-16 MED ORDER — TAMSULOSIN HCL 0.4 MG PO CAPS
0.8000 mg | ORAL_CAPSULE | Freq: Every evening | ORAL | Status: DC
  Administered 2022-08-16 – 2022-08-17 (×2): 0.8 mg via ORAL
  Filled 2022-08-16 (×2): qty 2

## 2022-08-16 MED ORDER — METOPROLOL SUCCINATE ER 100 MG PO TB24
100.0000 mg | ORAL_TABLET | Freq: Every day | ORAL | Status: DC
  Administered 2022-08-16: 100 mg via ORAL
  Filled 2022-08-16: qty 1

## 2022-08-16 MED ORDER — LACTATED RINGERS IV SOLN: INTRAVENOUS | Status: DC | PRN

## 2022-08-16 MED ORDER — FENTANYL CITRATE (PF) 250 MCG/5ML IJ SOLN
INTRAMUSCULAR | Status: DC | PRN
  Administered 2022-08-16 (×3): 25 ug via INTRAVENOUS

## 2022-08-16 MED ORDER — LABETALOL HCL 5 MG/ML IV SOLN
10.0000 mg | INTRAVENOUS | Status: DC | PRN
  Administered 2022-08-16: 10 mg via INTRAVENOUS
  Filled 2022-08-16: qty 4

## 2022-08-16 MED ORDER — CEFAZOLIN SODIUM-DEXTROSE 2-4 GM/100ML-% IV SOLN
2.0000 g | INTRAVENOUS | Status: DC
  Filled 2022-08-16: qty 100

## 2022-08-16 MED ORDER — OXYCODONE-ACETAMINOPHEN 5-325 MG PO TABS
1.0000 | ORAL_TABLET | ORAL | Status: DC | PRN
  Administered 2022-08-16 – 2022-08-18 (×4): 2 via ORAL
  Filled 2022-08-16 (×4): qty 2

## 2022-08-16 MED ORDER — ASPIRIN 81 MG PO TBEC
81.0000 mg | DELAYED_RELEASE_TABLET | Freq: Every day | ORAL | Status: DC
  Administered 2022-08-17 – 2022-08-18 (×2): 81 mg via ORAL
  Filled 2022-08-16 (×2): qty 1

## 2022-08-16 MED ORDER — ALUM & MAG HYDROXIDE-SIMETH 200-200-20 MG/5ML PO SUSP: 15.0000 mL | ORAL | Status: DC | PRN

## 2022-08-16 MED ORDER — HYDRALAZINE HCL 20 MG/ML IJ SOLN
5.0000 mg | INTRAMUSCULAR | Status: AC | PRN
  Administered 2022-08-16 (×2): 5 mg via INTRAVENOUS
  Filled 2022-08-16 (×2): qty 1

## 2022-08-16 MED ORDER — DEXAMETHASONE SODIUM PHOSPHATE 10 MG/ML IJ SOLN
INTRAMUSCULAR | Status: AC
  Filled 2022-08-16: qty 1

## 2022-08-16 MED ORDER — ROSUVASTATIN CALCIUM 20 MG PO TABS
40.0000 mg | ORAL_TABLET | Freq: Every day | ORAL | Status: DC
  Administered 2022-08-16 – 2022-08-18 (×3): 40 mg via ORAL
  Filled 2022-08-16 (×3): qty 2

## 2022-08-16 MED ORDER — METOPROLOL TARTRATE 5 MG/5ML IV SOLN: 2.0000 mg | INTRAVENOUS | Status: DC | PRN

## 2022-08-16 MED ORDER — LACTATED RINGERS IV SOLN: INTRAVENOUS | Status: DC

## 2022-08-16 MED ORDER — HEPARIN 6000 UNIT IRRIGATION SOLUTION
Status: DC | PRN
  Administered 2022-08-16 (×2): 1

## 2022-08-16 MED ORDER — ACETAMINOPHEN 325 MG PO TABS
325.0000 mg | ORAL_TABLET | ORAL | Status: DC | PRN
  Administered 2022-08-17: 325 mg via ORAL
  Filled 2022-08-16: qty 1

## 2022-08-16 MED ORDER — ACETAMINOPHEN 650 MG RE SUPP: 325.0000 mg | RECTAL | Status: DC | PRN

## 2022-08-16 SURGICAL SUPPLY — 90 items
ADH SKN CLS APL DERMABOND .7 (GAUZE/BANDAGES/DRESSINGS) ×2
BAG COUNTER SPONGE SURGICOUNT (BAG) ×2 IMPLANT
BAG DECANTER FOR FLEXI CONT (MISCELLANEOUS) IMPLANT
BAG SPNG CNTER NS LX DISP (BAG) ×2
BALLN CODA OCL 2-10-35-140-46 (BALLOONS) ×2
BALLN CODA OCL 2-9.0-35-120-3 (BALLOONS)
BALLN MUSTANG 8X20X75 (BALLOONS) ×6
BALLOON COD OCL 2-10-35-140-46 (BALLOONS) IMPLANT
BALLOON COD OCL 2-9.0-35-120-3 (BALLOONS) IMPLANT
BALLOON MUSTANG 8X20X75 (BALLOONS) IMPLANT
CANISTER SUCT 3000ML PPV (MISCELLANEOUS) ×2 IMPLANT
CATH BEACON 5 .035 65 KMP TIP (CATHETERS) IMPLANT
CATH BEACON 5 .035 65 VANSC4 (CATHETERS) IMPLANT
CATH NAVICROSS ANGLED 90CM (MICROCATHETER) IMPLANT
CATH OMNI FLUSH .035X70CM (CATHETERS) IMPLANT
CATH QUICKCROSS SUPP .035X90CM (MICROCATHETER) IMPLANT
CATH TEMPO 5F RIM 65CM (CATHETERS) IMPLANT
CAUTERY HI TEMP FINE TIP 2 (MISCELLANEOUS) IMPLANT
CLOSURE PERCLOSE PROSTYLE (VASCULAR PRODUCTS) IMPLANT
DERMABOND ADVANCED .7 DNX12 (GAUZE/BANDAGES/DRESSINGS) ×2 IMPLANT
DEVICE ENSNARE  12MMX20MM (VASCULAR PRODUCTS) ×2
DEVICE ENSNARE 12MMX20MM (VASCULAR PRODUCTS) IMPLANT
DEVICE TORQUE H2O (MISCELLANEOUS) IMPLANT
DRAPE ZERO GRAVITY STERILE (DRAPES) ×2 IMPLANT
DRSG TEGADERM 2-3/8X2-3/4 SM (GAUZE/BANDAGES/DRESSINGS) ×4 IMPLANT
ELECT REM PT RETURN 9FT ADLT (ELECTROSURGICAL) ×4
ELECTRODE REM PT RTRN 9FT ADLT (ELECTROSURGICAL) ×4 IMPLANT
EXCLDR EXT ENDO 23X4.5 15F (Endovascular Graft) ×2 IMPLANT
EXCLUDER EXT ENDO 23X4.5 15F (Endovascular Graft) IMPLANT
GAUZE SPONGE 2X2 8PLY STRL LF (GAUZE/BANDAGES/DRESSINGS) ×4 IMPLANT
GLIDEWIRE ANGLED NITR .018X260 (WIRE) IMPLANT
GLOVE SURG SS PI 7.5 STRL IVOR (GLOVE) ×6 IMPLANT
GOWN STRL REUS W/ TWL LRG LVL3 (GOWN DISPOSABLE) ×4 IMPLANT
GOWN STRL REUS W/ TWL XL LVL3 (GOWN DISPOSABLE) ×4 IMPLANT
GOWN STRL REUS W/TWL LRG LVL3 (GOWN DISPOSABLE) ×4
GOWN STRL REUS W/TWL XL LVL3 (GOWN DISPOSABLE) ×4
GRAFT BALLN CATH 65CM (STENTS) IMPLANT
GRAFT FEN DIST EVAR 12X28X76 (Graft) IMPLANT
GRAFT FEN PROX EVAR 36X122M (Graft) IMPLANT
GRAFT LEG ILIAC ZSLE-13-74-ZT (Endovascular Graft) IMPLANT
GRAFT LEG ILIAC ZSLE-13-90-ZT (Endovascular Graft) IMPLANT
GRAFT LEG ILIAC ZSLE-24-39-ZT (Endovascular Graft) IMPLANT
GUIDEWIRE ANGLED .035X260CM (WIRE) IMPLANT
HEMOSTAT SNOW SURGICEL 2X4 (HEMOSTASIS) IMPLANT
KIT BASIN OR (CUSTOM PROCEDURE TRAY) ×2 IMPLANT
KIT ENCORE 26 ADVANTAGE (KITS) ×2 IMPLANT
KIT MICROPUNCTURE NIT STIFF (SHEATH) IMPLANT
KIT TURNOVER KIT B (KITS) ×2 IMPLANT
MARKER SKIN DUAL TIP RULER LAB (MISCELLANEOUS) IMPLANT
NDL PERC 18GX7CM (NEEDLE) IMPLANT
NEEDLE PERC 18GX7CM (NEEDLE) IMPLANT
NS IRRIG 1000ML POUR BTL (IV SOLUTION) ×2 IMPLANT
PACK ENDOVASCULAR (PACKS) ×2 IMPLANT
PAD ARMBOARD 7.5X6 YLW CONV (MISCELLANEOUS) ×4 IMPLANT
PENCIL BUTTON HOLSTER BLD 10FT (ELECTRODE) IMPLANT
SET DILATOR ENDOVASCULAR 20FR (SET/KITS/TRAYS/PACK) IMPLANT
SET MICROPUNCTURE 5F STIFF (MISCELLANEOUS) ×2 IMPLANT
SHEATH BRITE TIP 8FR 35CM (SHEATH) ×2 IMPLANT
SHEATH DRYSEAL FLEX 18FR 33CM (SHEATH) IMPLANT
SHEATH DRYSEAL FLEX 20FR 33CM (SHEATH) IMPLANT
SHEATH GUIDING 6.5 FR 55X73X9 (SHEATH) IMPLANT
SHEATH GUIDING 7F 55X73X9MM TD (SHEATH) IMPLANT
SHEATH HIGHFLEX ANSEL 6FRX55 (SHEATH) IMPLANT
SHEATH HIGHFLEX ANSEL 7FR 55CM (SHEATH) ×2 IMPLANT
SHEATH PEEL AWAY 15.5X16F (SHEATH) IMPLANT
SHEATH PINNACLE 8F 10CM (SHEATH) ×2 IMPLANT
SHIELD RADPAD SCOOP 12X17 (MISCELLANEOUS) ×4 IMPLANT
SNARE GOOSENECK 15MM (MISCELLANEOUS) IMPLANT
STENT GRAFT BALLN CATH 65CM (STENTS) ×2
STENT VIABAHN 6X29 6FR 80 (Permanent Stent) IMPLANT
STENT VIABAHN 6X39 6FR 80 (Permanent Stent) IMPLANT
STENT VIABAHN 7X39 6FR 80 (Permanent Stent) IMPLANT
STOPCOCK MORSE 400PSI 3WAY (MISCELLANEOUS) ×2 IMPLANT
SUT ETHILON 3 0 PS 1 (SUTURE) IMPLANT
SUT PROLENE 5 0 C 1 24 (SUTURE) IMPLANT
SUT PROLENE 6 0 BV (SUTURE) IMPLANT
SUT SILK 0 TIES 10X30 (SUTURE) IMPLANT
SUT SILK 2 0 SH (SUTURE) ×2 IMPLANT
SUT VIC AB 2-0 CT1 27 (SUTURE)
SUT VIC AB 2-0 CT1 TAPERPNT 27 (SUTURE) IMPLANT
SUT VIC AB 3-0 SH 27 (SUTURE)
SUT VIC AB 3-0 SH 27X BRD (SUTURE) IMPLANT
SUT VICRYL 4-0 PS2 18IN ABS (SUTURE) ×4 IMPLANT
SYR 30ML LL (SYRINGE) IMPLANT
TOWEL GREEN STERILE (TOWEL DISPOSABLE) ×2 IMPLANT
TRAY FOLEY MTR SLVR 16FR STAT (SET/KITS/TRAYS/PACK) ×2 IMPLANT
TUBING HIGH PRESSURE 120CM (CONNECTOR) ×2 IMPLANT
WIRE BENTSON .035X145CM (WIRE) ×4 IMPLANT
WIRE ROSEN-J .035X260CM (WIRE) ×4 IMPLANT
WIRE STIFF LUNDERQUIST 260MM (WIRE) ×4 IMPLANT

## 2022-08-16 NOTE — Final Progress Note (Signed)
S/P FEVAR No complaints.  Denies leg pain Abd soft Groins without hematoma Calfs soft  Will give plavix in PACU   Wells Khrystyna Schwalm

## 2022-08-16 NOTE — Anesthesia Procedure Notes (Signed)
Arterial Line Insertion Start/End3/06/2022 7:35 AM Performed by: Darletta Moll, CRNA, CRNA  Patient location: Pre-op. Preanesthetic checklist: patient identified, IV checked, site marked, risks and benefits discussed, surgical consent, monitors and equipment checked, pre-op evaluation, timeout performed and anesthesia consent Lidocaine 1% used for infiltration Right, radial was placed Catheter size: 20 G Hand hygiene performed  and maximum sterile barriers used   Attempts: 1 Procedure performed without using ultrasound guided technique. Following insertion, dressing applied and Biopatch. Post procedure assessment: normal and unchanged  Patient tolerated the procedure well with no immediate complications.

## 2022-08-16 NOTE — Anesthesia Procedure Notes (Signed)
Procedure Name: Intubation Date/Time: 08/16/2022 7:59 AM  Performed by: Darletta Moll, CRNAPre-anesthesia Checklist: Patient identified, Emergency Drugs available, Suction available and Patient being monitored Patient Re-evaluated:Patient Re-evaluated prior to induction Oxygen Delivery Method: Circle system utilized Preoxygenation: Pre-oxygenation with 100% oxygen Induction Type: IV induction Ventilation: Mask ventilation without difficulty and Oral airway inserted - appropriate to patient size Laryngoscope Size: Mac and 4 Grade View: Grade I Tube type: Oral Tube size: 7.5 mm Number of attempts: 1 Airway Equipment and Method: Stylet and Oral airway Placement Confirmation: ETT inserted through vocal cords under direct vision, positive ETCO2 and breath sounds checked- equal and bilateral Secured at: 23 cm Tube secured with: Tape Dental Injury: Teeth and Oropharynx as per pre-operative assessment

## 2022-08-16 NOTE — H&P (Signed)
 [facility] [place]  Patient name: [name] MRN: [id number] DOB: 06/20/1945 Sex: male   REASON FOR VISIT:    Follow up AAA  HISOTRY OF PRESENT ILLNESS:    [name] is a 77 y.o. male who I have been asked to see the by Dr. Dickson for a 7 cm juxtarenal abdominal aortic aneurysm.  He has a suprapubic catheter in place which makes open surgery complicated, therefore endovascular repair would be preferable.  Previously, the patient has been reluctant to consider surgery as he was recovering from multiple medical issues.  He has a history of a neoplasm of ampulla of Vater, lung cancer, and prostate adenocarcinoma.  He is also had issues with protein calorie malnutrition and significant weight loss.  He has a history of coronary artery disease, status post PCI in the past   PAST MEDICAL HISTORY:   Past Medical History:  Diagnosis Date   AAA (abdominal aortic aneurysm) (HCC)    followed by vasc surgery   Alcohol abuse    Barretts esophagus    bx distal esophagus 2011 neg    Bladder outlet obstruction 04/02/2018   CAD in native artery    a. s/p stent to prox RCA and mid Cx.   Cataract    Cholelithiasis    Chronic osteomyelitis involving ankle and foot, right (HCC) 07/13/2018   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   2006 - 3 adenomas largest 12 mm  2008 - no polyps 2013-  2 mm cecal polyp, adenoma, routine repeat colonoscopy 02/2017 approx 05/14/2017 18 polyps max 12 mm ALL adenomas recall 2019 -  Carl E. Gessner, MD, FACG      Dieulafoy lesion of duodenum    Diverticulosis of colon    DJD (degenerative joint disease)    left hip   Full dentures    upper and lower   GERD (gastroesophageal reflux disease)    diet controlled    Gout    feet   Hiatal hernia    2 cm noted on upper endos 2011    Hx of adenomatous colonic polyps    Hyperlipidemia    Hypertension    Internal hemorrhoid    Lung cancer (HCC)     squamous cell (T2N0MX), 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 pt was on Plavix x 3 years then transitioned to ASA, no problems since   Non-small cell carcinoma of right lung, stage 1 (HCC) 01/30/2016   Renal failure, acute (HCC)    hx of 2/2 medication, resolved   Tobacco abuse    smoker .5 ppd x 55 yrs   UTI (lower urinary tract infection)      FAMILY HISTORY:   Family History  Problem Relation Age of Onset   Diabetes Mother    Hypertension Sister    Heart disease Sister    Diabetes Sister    Arthritis Brother    Gout Brother    Colon cancer Neg Hx    Esophageal cancer Neg Hx    Rectal cancer Neg Hx    Stomach cancer Neg Hx    Colon polyps Neg Hx    Inflammatory bowel disease Neg Hx    Liver disease Neg Hx    Pancreatic cancer Neg Hx     SOCIAL HISTORY:   Social History   Tobacco Use   Smoking status: Every Day    Packs/day: 0.75      Years: 55.00    Total pack years: 41.25    Types: Cigarettes   Smokeless tobacco: Never   Tobacco comments:    Smokes 5-6 packs of cigarettes a week. 03/05/2022 Tay  Substance Use Topics   Alcohol use: Not Currently     ALLERGIES:   Allergies  Allergen Reactions   Candesartan Cilexetil-Hctz Other (See Comments)    Acute renal failure   Hydrochlorothiazide Other (See Comments)    Acute renal failure   Shellfish Allergy Other (See Comments)    Gout occurs     CURRENT MEDICATIONS:   Current Outpatient Medications  Medication Sig Dispense Refill   acetaminophen (TYLENOL 8 HOUR) 650 MG CR tablet Take 1 tablet (650 mg total) by mouth every 8 (eight) hours as needed for pain or fever. 30 tablet 0   albuterol (PROAIR HFA) 108 (90 Base) MCG/ACT inhaler Inhale 1-2 puffs into the lungs every 6 (six) hours as needed for wheezing or shortness of breath. 1 each 2   amLODipine (NORVASC) 10 MG tablet Take 1 tablet (10 mg total) by mouth daily. 90 tablet 3   escitalopram  (LEXAPRO) 5 MG tablet Take 1 tablet (5 mg total) by mouth daily. 90 tablet 1   HYDROcodone-acetaminophen (NORCO) 7.5-325 MG tablet Take 1 tablet by mouth 2 (two) times daily as needed for moderate pain. 60 tablet 0   Iron Combinations (CHROMAGEN) capsule Take 1 capsule by mouth daily.     metoprolol succinate (TOPROL-XL) 50 MG 24 hr tablet Take 1 tablet (50 mg total) by mouth daily. Take with or immediately following a meal. 90 tablet 3   mirtazapine (REMERON) 7.5 MG tablet TAKE 1 TABLET BY MOUTH AT BEDTIME. 90 tablet 1   Pancrelipase, Lip-Prot-Amyl, (ZENPEP) 20000-63000 units CPEP Take 3 capsules by mouth 3 (three) times daily before meals. 270 capsule 3   pantoprazole (PROTONIX) 40 MG tablet TAKE 1 TABLET EVERY DAY FOR REFLUX 90 tablet 3   rosuvastatin (CRESTOR) 40 MG tablet Take 1 tablet (40 mg total) by mouth daily. 90 tablet 3   senna (SENOKOT) 8.6 MG TABS tablet Take 2 tablets (17.2 mg total) by mouth in the morning and at bedtime. (Patient taking differently: Take 1 tablet by mouth in the morning and at bedtime.) 120 tablet 1   tamsulosin (FLOMAX) 0.4 MG CAPS capsule Take 2 capsules (0.8 mg total) by mouth every evening. 180 capsule 3   vitamin C (ASCORBIC ACID) 500 MG tablet Take 500 mg by mouth daily.     No current facility-administered medications for this visit.    REVIEW OF SYSTEMS:   [X] denotes positive finding, [ ] denotes negative finding Cardiac  Comments:  Chest pain or chest pressure:    Shortness of breath upon exertion:    Short of breath when lying flat:    Irregular heart rhythm:        Vascular    Pain in calf, thigh, or hip brought on by ambulation:    Pain in feet at night that wakes you up from your sleep:     Blood clot in your veins:    Leg swelling:         Pulmonary    Oxygen at home:    Productive cough:     Wheezing:         Neurologic    Sudden weakness in arms or legs:     Sudden numbness in arms or legs:     Sudden onset of difficulty  speaking   or slurred speech:    Temporary loss of vision in one eye:     Problems with dizziness:         Gastrointestinal    Blood in stool:     Vomited blood:         Genitourinary    Burning when urinating:     Blood in urine:        Psychiatric    Major depression:         Hematologic    Bleeding problems:    Problems with blood clotting too easily:        Skin    Rashes or ulcers:        Constitutional    Fever or chills:      PHYSICAL EXAM:   Vitals:   07/15/22 0958  BP: 118/71  Pulse: (!) 54  Resp: 20  Temp: 97.9 F (36.6 C)  SpO2: 95%  Weight: 139 lb (63 kg)  Height: 6' (1.829 m)    GENERAL: The patient is a well-nourished male, in no acute distress. The vital signs are documented above. CARDIAC: There is a regular rate and rhythm.  VASCULAR: I had trouble palpating pedal pulses PULMONARY: Non-labored respirations ABDOMEN: Soft and non-tender with pulsatile mass.  MUSCULOSKELETAL: There are no major deformities or cyanosis. NEUROLOGIC: No focal weakness or paresthesias are detected. SKIN: There are no ulcers or rashes noted. PSYCHIATRIC: The patient has a normal affect.  STUDIES:   I have reviewed the following CTA:  VASCULAR   1. Continued enlargement of previously visualized fusiform infrarenal abdominal aortic aneurysm now measuring up to 7.9 cm, previously 7.2 cm. Recommend referral to a vascular specialist if not already obtained. This recommendation follows ACR consensus guidelines: White Paper of the ACR Incidental Findings Committee II on Vascular Findings. J Am Coll Radiol 2013; 10:789-794. 2. Poor opacification of the inflow vessels due to contrast bolus timing in likely a degree of diminished cardiac output. 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 3.  Aortic Atherosclerosis (ICD10-I70.0). 4. Biatrial cardiomegaly.   NON-VASCULAR   1. No  acute abdominopelvic abnormality. 2.  Emphysema (ICD10-J43.9). 3. Prostatomegaly.  PLAN:  Juxtarenal 7 9 cm aneurysm: I discussed with the patient that he is at significant risk for rupture with nonsurgical treatment.  I think he is a poor open surgical repair candidate because of his indwelling suprapubic catheter for his bladder outlet obstruction.  From endovascular standpoint, he will require a fenestrated graft.  This will be somewhat challenging because of the tortuosity within his iliac vessels as well as a short right common iliac artery which will likely require coil embolization but potentially I could fenestrate a iliac limb.  I discussed the potential risks of surgery with the patient including but not limited to the risk of renal insufficiency, intestinal ischemia, cardiopulmonary complications, groin access complications, death.  At this point, he wishes to proceed.  I will get his graft made and then get him scheduled in the near future.    Wells Korde Jeppsen, IV, MD, FACS [facility]  Tel [phone] Pager [phone]    artery measuring up to approximately 2.0 cm. 3.  Aortic Atherosclerosis (ICD10-I70.0). 4. Biatrial cardiomegaly.   NON-VASCULAR   1. No acute abdominopelvic abnormality. 2.  Emphysema (ICD10-J43.9). 3. Prostatomegaly.   PLAN:  Juxtarenal 7 9 cm aneurysm: I discussed with the patient that he is at significant risk for rupture with nonsurgical treatment.  I think he is a poor open surgical repair candidate because of his indwelling suprapubic catheter for his bladder outlet obstruction.  From endovascular standpoint, he will require a fenestrated graft.  This will be somewhat challenging because of the tortuosity within his iliac vessels as well as a short right common iliac artery which will likely require coil embolization but potentially I could fenestrate a iliac limb.  I discussed the potential risks of surgery with the patient including but not limited to the risk of renal insufficiency, intestinal ischemia, cardiopulmonary complications, groin access complications, death.  At this point, he wishes to proceed.  I will get his graft made and then get him scheduled in the near future.       Leia Alf, MD, FACS [facility] [phone] Pager [phone]

## 2022-08-16 NOTE — Op Note (Signed)
Patient name: [name] MRN: [id number] DOB: 07-Oct-1945 Sex: male  08/16/2022 Pre-operative Diagnosis: Juxtarenal aortic aneurysm Post-operative diagnosis:  Same Surgeon:  Annamarie Major Assistants:  Arlee Muslim, PA Procedure:   #1; fenestrated juxtarenal abdominal aortic aneurysm repair   #2: Stent, right renal artery   #3: Stent, left renal artery   #4: Right internal iliac fenestration with right internal iliac artery stent   #5: Abdominal aortogram   #6: Pelvic angiogram   #7: Bilateral ultrasound-guided common femoral artery access   #8: Radiology S&I codes Anesthesia:  General Blood Loss:  100 cc  Specimens:  none  Findings: Successful fenestrated juxtarenal aneurysm repair with no evidence of endoleak.  The right common iliac artery was very short and so I created a fenestration on the back table and stented the right internal iliac artery.  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  He is not a open candidate because of his suprapubic catheter.  He comes in today for endovascular repair.  The risks and benefits were discussed with the patient at his office visit as well as in the holding area.  All questions were answered and he wished to proceed.  Procedure:  The patient was identified in the holding area and taken to [facility] [facility]  The patient was then placed supine on the table. general anesthesia was administered.  The patient was prepped and draped in the usual sterile fashion.  A time out was called and antibiotics were administered.  A PA was necessary to expedite the procedure and assist with technical  details.  He helped with wire exchanges, making sure that the sheaths stayed in place as well as sheath removal.  Ultrasound was used to evaluate bilateral common femoral arteries which were calcified but patent.  A #11 blade was used to make a skin nick.  Bilateral common femoral arteries were cannulated under ultrasound guidance with a micropuncture needle.  A 018 wire was inserted without resistance followed by placement of micropuncture sheath.  A Bentson wire was then inserted.  Pro-glide devices were deployed at the 11:00 and 1 o'clock position bilaterally for free closure.  8 French sheaths were placed bilaterally.  The patient was fully heparinized.  Heparin levels were monitored and redosed based on ACT measurements.  Lunderquist wires were placed bilaterally.  This straightened out the tortuosity in the iliac arteries.  A 20 French dry seal sheath was advanced up the right side and a Omni Flush catheter was positioned at T12.  The main body was prepared on the back table.  This was a Lacinda Axon T-2-36-112 device.  It was advanced into the aorta after making sure it was oriented properly.  An abdominal aortogram was then performed locating the renal arteries.  I first deployed the top 2 stents of the proximal device and lined these up with the renal arteries.  The remaining portion of the proximal piece was then deployed distally.  I then cannulated the distal end with a Berenstein catheter and a Bentson wire.  The catheter was advanced into the device.  I then placed a Lunderquist wire.  The dilator for the sheath was then reinserted on the right and the sheath was advanced into the main body.  Next, the left renal artery was cannulated with a 6.5 Oscor sheath.  The Glidewire was advanced out into the renal artery followed by a quick cross catheter.  The Glidewire was removed and a Rosen wire was then placed.  The Oscor sheath was removed and a 7 mm Ansell sheath was advanced into the left renal artery.  A  second access in the dry seal sheath on the right was obtained and the 6.5 Oscor sheath was then inserted and used to cannulate the right renal artery.  A Glidewire and quick cross catheter were advanced out into the right renal artery.  A Rosen wire was then placed.  I then selected a 6 [place] Ansell sheath and advanced this out into the right renal artery.  Contrast injections were performed through both sheaths confirming that we were in the renal artery and defining the anatomy.  There was a early branch on the left.  At this point, the top Of the proximal stent was deployed and the delivery system was removed and replaced with a 20 French dry seal sheath.  The left renal stent was deployed first.  This was a Gore VBX 6 x 29 stent.  I landed just proximal to the branch.  There were approximately 2-1/2-3 stents into the aorta.  I then riveted the stent with a 8 x 20 balloon.  Next, the right renal stent was inserted.  This was a Gore VBX 6 x 39.  This was deployed with approximately 1.5 stents into the aorta.  It was flared with a 8 x 20 balloon.  Next, the distal piece was inserted up the left side..  This was a Lacinda Axon D-06-14-75.  Once it was in adequate position, contrast injections were performed to locate the aortic bifurcation as well as the hypogastric arteries.  The device was then deployed down to the contralateral gate.  I then cannulated the contralateral gate with a Berenstein catheter and a Glidewire.  The catheter was then advanced and a Lunderquist wire was placed.  Next, a Coda balloon was advanced into the contralateral gate and inflated.  The gate mushroom and, confirming successful cannulation.  I then rotated the image detector to a RAO position and performed angiography to locate the left hypogastric artery.  The remaining portion of the ipsilateral limb was deployed.  The delivery system was removed.  A left iliac extension was inserted.  This was a Cook 24 x 39 device.  It was then deployed  landing just proximal to the left hypogastric artery.  At this point, we imaged the left hypogastric artery.  I felt that the right common iliac artery was too short to get a seal and so I set up to fenestrate the right sided extension.  On the back table, a Cook 13 x 90 device was partially deployed.  I selected a site 6 mm distal to the proximal portion of the graft.  I created a fenestration measuring 6 mm with a eye cautery.  A radiopaque snare was then sewn in place around the fenestration.  The device was then placed into a peel-away sheath.  This was then inserted into the Gore dry seal sheath which had been advanced into the contralateral limb.  I then got good images of the origin of the hypogastric artery and withdrew the Gore sheath over  top of the [place] device.  The fenestration opened facing the right hypogastric artery.  I had a fair amount of difficulty getting out the fenestration and directing a wire into the hypogastric artery.  Ultimately, I was able to get a Glidewire out into a distal pelvic artery branch.  A Nava cross catheter was then advanced out the fenestration into the distal pelvic artery branches and a Rosen wire was placed.  I then advanced a 6 [place] Ansell sheath out the fenestration.  A Gore VBX 7 x 39 stent was then deployed into the hypogastric artery with approximately 1 stent remaining into the right iliac limb.  The stent was then flared with a 8 mm balloon.  Contrast injection showed preserved patency of the hypogastric artery after stent deployment.  I then molded the distal main body as well as the bilateral iliac limbs with a Gore MOB balloon.  A completion image was then obtained which showed preserved patency of bilateral renal arteries, bilateral iliac arteries and bilateral hypogastric arteries.  There did appear to be a type Ib endoleak on the left.  In order to address the tight 1B left endoleak, I selected a Gore 23 x 4 point 5 aortic cuff.  This was deployed  landing at the level of the hypogastric artery.  The MOB balloon was used to mold the limb and a retrograde injection was performed to the sheath in the left groin which showed resolution of the type Ib endoleak.  At this point I was very satisfied with the results.  The stiff wires were exchanged out for Bentson wires.  The sheaths were removed and bilateral femoral arteries were closed by securing the previously placed ProGlide devices.  The patient had brisk pedal Doppler signals.  The heparin was reversed with 50 mg of protamine.  The subcutaneous tissue was cauterized with the Bovie and Dermabond was placed over the incisions.  The patient successfully extubated and taken recovery room in stable condition, moving all 4 extremities to command   Disposition: To PACU stable.   Theotis Burrow, M.D., [facility] [place] Office: [phone] Pager:  [phone]

## 2022-08-16 NOTE — Transfer of Care (Signed)
Immediate Anesthesia Transfer of Care Note  Patient: [name]  Procedure(s) Performed: ABDOMINAL AORTIC ENDOVASCULAR FENESTRATED STENT GRAFT (Groin) INSERTION OF ILIAC STENT RIGHT EXTERNAL ARTERY, LEFT COMMON ARTERY, AND RIGHT COMMON ARTERY (Bilateral: Groin) ENDOVASCULAR STENT GRAFT INSERTION OF BILATERAL RENAL ARTERIES (Bilateral: Groin) ULTRASOUND GUIDANCE FOR VASCULAR ACCESS (Bilateral: Groin)  Patient Location: PACU  Anesthesia Type:General  Level of Consciousness: drowsy and patient cooperative  Airway & Oxygen Therapy: Patient Spontanous Breathing and Patient connected to nasal cannula oxygen  Post-op Assessment: Report given to RN, Post -op Vital signs reviewed and stable, and Patient moving all extremities X 4  Post vital signs: Reviewed and stable  Last Vitals:  Vitals Value Taken Time  BP 131/67 08/16/22 1445  Temp    Pulse 56 08/16/22 1446  Resp 19 08/16/22 1446  SpO2 97 % 08/16/22 1446  Vitals shown include unvalidated device data.  Last Pain:  Vitals:   08/16/22 0626  TempSrc:   PainSc: 0-No pain      Patients Stated Pain Goal: 0 ([id number])  Complications: No notable events documented.

## 2022-08-17 LAB — BASIC METABOLIC PANEL
Anion gap: 9 (ref 5–15)
BUN: 12 mg/dL (ref 8–23)
CO2: 23 mmol/L (ref 22–32)
Calcium: 8.3 mg/dL — ABNORMAL LOW (ref 8.9–10.3)
Chloride: 105 mmol/L (ref 98–111)
Creatinine, Ser: 1.18 mg/dL (ref 0.61–1.24)
GFR, Estimated: >60 mL/min (ref >60)
Glucose, Bld: 130 mg/dL — ABNORMAL HIGH (ref 70–99)
Potassium: 3.3 mmol/L — ABNORMAL LOW (ref 3.5–5.1)
Sodium: 137 mmol/L (ref 135–145)

## 2022-08-17 LAB — CBC
HCT: 30.3 % — ABNORMAL LOW (ref 39.0–52.0)
Hemoglobin: 9.2 g/dL — ABNORMAL LOW (ref 13.0–17.0)
MCH: 29.2 pg (ref 26.0–34.0)
MCHC: 30.4 g/dL (ref 30.0–36.0)
MCV: 96.2 fL (ref 80.0–100.0)
Platelets: 111 10*3/uL — ABNORMAL LOW (ref 150–400)
RBC: 3.15 MIL/uL — ABNORMAL LOW (ref 4.22–5.81)
RDW: 16.8 % — ABNORMAL HIGH (ref 11.5–15.5)
WBC: 5.8 10*3/uL (ref 4.0–10.5)
nRBC: 0 % (ref 0.0–0.2)

## 2022-08-17 NOTE — Progress Notes (Addendum)
Progress Note    08/17/2022 8:03 AM 1 Day Post-Op  Subjective:  no complaints.  Unaware of bradycardia.  Denies dizziness, SOB, chest pain   Vitals:   08/17/22 0500 08/17/22 0746  BP: 128/69 128/66  Pulse:  (!) 56  Resp:  18  Temp:  98.8 F (37.1 C)  SpO2: 97% 95%   Physical Exam: Lungs:  non labored Incisions:  B groins without hematoma Extremities:  brisk DP signals by doppler Abdomen:  soft, NT, ND Neurologic: A&O  CBC    Component Value Date/Time   WBC 5.8 08/17/2022 0038   RBC 3.15 (L) 08/17/2022 0038   HGB 9.2 (L) 08/17/2022 0038   HGB 11.9 (L) 05/20/2022 1007   HGB 13.7 05/06/2017 0926   HCT 30.3 (L) 08/17/2022 0038   HCT 37.0 (L) 05/20/2022 1007   HCT 41.9 05/06/2017 0926   PLT 111 (L) 08/17/2022 0038   PLT 157 05/20/2022 1007   MCV 96.2 08/17/2022 0038   MCV 91 05/20/2022 1007   MCV 100.7 (H) 05/06/2017 0926   MCH 29.2 08/17/2022 0038   MCHC 30.4 08/17/2022 0038   RDW 16.8 (H) 08/17/2022 0038   RDW 14.6 05/20/2022 1007   RDW 14.5 05/06/2017 0926   LYMPHSABS 1.2 09/04/2021 1124   LYMPHSABS 1.0 05/06/2017 0926   MONOABS 0.3 09/04/2021 1124   MONOABS 0.4 05/06/2017 0926   EOSABS 0.1 09/04/2021 1124   EOSABS 0.1 05/06/2017 0926   BASOSABS 0.1 09/04/2021 1124   BASOSABS 0.0 05/06/2017 0926    BMET    Component Value Date/Time   NA 137 08/17/2022 0038   NA 140 05/20/2022 1007   NA 142 05/06/2017 0926   K 3.3 (L) 08/17/2022 0038   K 4.0 05/06/2017 0926   CL 105 08/17/2022 0038   CL 101 01/25/2011 0901   CO2 23 08/17/2022 0038   CO2 27 05/06/2017 0926   GLUCOSE 130 (H) 08/17/2022 0038   GLUCOSE 108 05/06/2017 0926   GLUCOSE 112 01/25/2011 0901   BUN 12 08/17/2022 0038   BUN 11 05/20/2022 1007   BUN 14.6 05/06/2017 0926   CREATININE 1.18 08/17/2022 0038   CREATININE 1.1 05/06/2017 0926   CALCIUM 8.3 (L) 08/17/2022 0038   CALCIUM 9.7 05/06/2017 0926   GFRNONAA >60 08/17/2022 0038   GFRNONAA >89 12/29/2014 0910   GFRAA >60 02/14/2020  1020   GFRAA >89 12/29/2014 0910    INR    Component Value Date/Time   INR 1.1 08/14/2022 0929     Intake/Output Summary (Last 24 hours) at 08/17/2022 0803 Last data filed at 08/17/2022 0555 Gross per 24 hour  Intake 1405.46 ml  Output 1050 ml  Net 355.46 ml     Assessment/Plan:  77 y.o. male is s/p FEVAR with B renal stents and R internal iliac stent 1 Day Post-Op   Groin incisions are well appearing without hematoma BLE well perfused Renal function stable Bradycardia: patient was bradycardiac throughout surgery and post operatively.  We will hold beta blockade; EKG this morning shows sinus bradycardia;  patient is asymptomatic; this is well documented by his Cardiologist.  We will consult Cardiology if he becomes symptomatic Potential d/c home tomorrow   Dagoberto Ligas, PA-C [facility] [phone] 08/17/2022 8:03 AM  I have seen and evaluated the patient. I agree with the PA note as documented above.  Postop day 1 status post FEVAR with bilateral renal stents and right internal iliac stent.  Both groins look good.  He has brisk  PT signals in both feet.  Renal function stable.  He has no complaints.  Bradycardia overnight which was noted IntraOp.  This is well-documented by cardiology given hx of asymptomatic bradycardia.  This bradycardia is asymptomatic.  Vitals are otherwise stable with good blood pressure.  He did get 100 mg of Toprol-XL last night.  We will hold that.  Plan to keep him one more day and will monitor.  Marty Heck, MD [facility] [place] Office: [phone]

## 2022-08-17 NOTE — Progress Notes (Signed)
Patient sinus brady w/1st degree block and frequent pauses, frequently in the 30s. Low HR down to 32. Asymptomatic, BP stable. Notified MD, no new orders received at the time. Will continue to follow current plan of care and monitor for any further changes.

## 2022-08-18 LAB — BASIC METABOLIC PANEL
Anion gap: 6 (ref 5–15)
BUN: 16 mg/dL (ref 8–23)
CO2: 24 mmol/L (ref 22–32)
Calcium: 8.3 mg/dL — ABNORMAL LOW (ref 8.9–10.3)
Chloride: 104 mmol/L (ref 98–111)
Creatinine, Ser: 1.24 mg/dL (ref 0.61–1.24)
GFR, Estimated: >60 mL/min (ref >60)
Glucose, Bld: 98 mg/dL (ref 70–99)
Potassium: 4.1 mmol/L (ref 3.5–5.1)
Sodium: 134 mmol/L — ABNORMAL LOW (ref 135–145)

## 2022-08-18 LAB — CBC
HCT: 28.4 % — ABNORMAL LOW (ref 39.0–52.0)
Hemoglobin: 9.2 g/dL — ABNORMAL LOW (ref 13.0–17.0)
MCH: 30.3 pg (ref 26.0–34.0)
MCHC: 32.4 g/dL (ref 30.0–36.0)
MCV: 93.4 fL (ref 80.0–100.0)
Platelets: 100 10*3/uL — ABNORMAL LOW (ref 150–400)
RBC: 3.04 MIL/uL — ABNORMAL LOW (ref 4.22–5.81)
RDW: 16.7 % — ABNORMAL HIGH (ref 11.5–15.5)
WBC: 7.2 10*3/uL (ref 4.0–10.5)
nRBC: 0 % (ref 0.0–0.2)

## 2022-08-18 MED ORDER — ASPIRIN 81 MG PO TBEC: 81.0000 mg | DELAYED_RELEASE_TABLET | Freq: Every day | ORAL | 12 refills | Status: DC

## 2022-08-18 MED ORDER — CLOPIDOGREL BISULFATE 75 MG PO TABS: 75.0000 mg | ORAL_TABLET | Freq: Every day | ORAL | 3 refills | Status: DC

## 2022-08-18 NOTE — Progress Notes (Addendum)
Progress Note    08/18/2022 9:03 AM 2 Days Post-Op  Subjective:  no complaints   Vitals:   08/18/22 0410 08/18/22 0815  BP: (!) 145/60 (!) 152/58  Pulse: 64 71  Resp: 19 17  Temp: 99.7 F (37.6 C) 98.8 F (37.1 C)  SpO2: 91% 90%   Physical Exam: Cardiac: regular rate Lungs:  non labored Incisions:  groin incisions c/d/i Extremities:  feet warm and well perfused Abdomen:  soft, NT, ND Neurologic: A&O  CBC    Component Value Date/Time   WBC 7.2 08/18/2022 0151   RBC 3.04 (L) 08/18/2022 0151   HGB 9.2 (L) 08/18/2022 0151   HGB 11.9 (L) 05/20/2022 1007   HGB 13.7 05/06/2017 0926   HCT 28.4 (L) 08/18/2022 0151   HCT 37.0 (L) 05/20/2022 1007   HCT 41.9 05/06/2017 0926   PLT 100 (L) 08/18/2022 0151   PLT 157 05/20/2022 1007   MCV 93.4 08/18/2022 0151   MCV 91 05/20/2022 1007   MCV 100.7 (H) 05/06/2017 0926   MCH 30.3 08/18/2022 0151   MCHC 32.4 08/18/2022 0151   RDW 16.7 (H) 08/18/2022 0151   RDW 14.6 05/20/2022 1007   RDW 14.5 05/06/2017 0926   LYMPHSABS 1.2 09/04/2021 1124   LYMPHSABS 1.0 05/06/2017 0926   MONOABS 0.3 09/04/2021 1124   MONOABS 0.4 05/06/2017 0926   EOSABS 0.1 09/04/2021 1124   EOSABS 0.1 05/06/2017 0926   BASOSABS 0.1 09/04/2021 1124   BASOSABS 0.0 05/06/2017 0926    BMET    Component Value Date/Time   NA 134 (L) 08/18/2022 0151   NA 140 05/20/2022 1007   NA 142 05/06/2017 0926   K 4.1 08/18/2022 0151   K 4.0 05/06/2017 0926   CL 104 08/18/2022 0151   CL 101 01/25/2011 0901   CO2 24 08/18/2022 0151   CO2 27 05/06/2017 0926   GLUCOSE 98 08/18/2022 0151   GLUCOSE 108 05/06/2017 0926   GLUCOSE 112 01/25/2011 0901   BUN 16 08/18/2022 0151   BUN 11 05/20/2022 1007   BUN 14.6 05/06/2017 0926   CREATININE 1.24 08/18/2022 0151   CREATININE 1.1 05/06/2017 0926   CALCIUM 8.3 (L) 08/18/2022 0151   CALCIUM 9.7 05/06/2017 0926   GFRNONAA >60 08/18/2022 0151   GFRNONAA >89 12/29/2014 0910   GFRAA >60 02/14/2020 1020   GFRAA >89  12/29/2014 0910    INR    Component Value Date/Time   INR 1.1 08/14/2022 0929     Intake/Output Summary (Last 24 hours) at 08/18/2022 0903 Last data filed at 08/18/2022 [id number] Gross per 24 hour  Intake 480 ml  Output 1025 ml  Net -545 ml     Assessment/Plan:  77 y.o. male is s/p FEVAR with B renal stents and R internal iliac stent   2 Days Post-Op   BLE warm and well perfused Groin incisions are healing well Heart: regular rate this morning with beta blockade on hold; we will continue to hold this at discharge and patient will follow up with his Cardiologist Continue aspirin, plavix, statin Home today; follow up in 1 month with CTA a/p with Dr. Army Chaco, PA-C [facility] [phone] 08/18/2022 9:03 AM  I have seen and evaluated the patient. I agree with the PA note as documented above.  Postop day 2 status post fenestrated endograft with bilateral renal stents and right internal iliac stent for juxtrarneal aortic aneurysm.  Groins look good.  No abdominal pain.  Ambulating in the  room this morning.  Wants to go home.  Cr stable 1.24.  Hgb stable 9.2.  Discussed aspirin Plavix at discharge.  Plavix will be a new prescription for his renal stents.  His Lopressor was held yesterday due to ongoing asymptomatic bradycardia.  Heart rate is much better today.  We will recommend that he follow-up with his cardiologist as an outpatient to discuss restarting this.  Asymptomatic bradycardia has been worked up by cardiology in the past.  Marty Heck, MD [facility]: [phone]

## 2022-08-18 NOTE — Progress Notes (Signed)
Patient given discharge instructions by swat nurse. PIV removed. Telemetry box removed, CCMD notified. Patient taken to vehicle in wheelchair by staff.  Daymon Larsen, RN

## 2022-08-18 NOTE — Discharge Instructions (Signed)
   [facility] [place]   Discharge Instructions  Endovascular Aortic Aneurysm Repair  Please refer to the following instructions for your post-procedure care. Your surgeon or Physician Assistant will discuss any changes with you.  Activity  You are encouraged to walk as much as you can. You can slowly return to normal activities but must avoid strenuous activity and heavy lifting until your doctor tells you it's OK. Avoid activities such as vacuuming or swinging a gold club. It is normal to feel tired for several weeks after your surgery. Do not drive until your doctor gives the OK and you are no longer taking prescription pain medications. It is also normal to have difficulty with sleep habits, eating, and bowel movements after surgery. These will go away with time.  Bathing/Showering  You may shower after you go home. If you have an incision, do not soak in a bathtub, hot tub, or swim until the incision heals completely.  Incision Care  Shower every day. Clean your incision with mild soap and water. Pat the area dry with a clean towel. You do not need a bandage unless otherwise instructed. Do not apply any ointments or creams to your incision. If you clothing is irritating, you may cover your incision with a dry gauze pad.  Diet  Resume your normal diet. There are no special food restrictions following this procedure. A low fat/low cholesterol diet is recommended for all patients with vascular disease. In order to heal from your surgery, it is CRITICAL to get adequate nutrition. Your body requires vitamins, minerals, and protein. Vegetables are the best source of vitamins and minerals. Vegetables also provide the perfect balance of protein. Processed food has little nutritional value, so try to avoid this.  Medications  Resume taking all of your medications unless your doctor or nurse practitioner tells you not to. If your incision is causing pain, you may take  over-the-counter pain relievers such as acetaminophen (Tylenol). If you were prescribed a stronger pain medication, please be aware these medications can cause nausea and constipation. Prevent nausea by taking the medication with a snack or meal. Avoid constipation by drinking plenty of fluids and eating foods with a high amount of fiber, such as fruits, vegetables, and grains. Do not take Tylenol if you are taking prescription pain medications.   Follow up  Our office will schedule a follow-up appointment with a C.T. scan 3-4 weeks after your surgery.  Please call us immediately for any of the following conditions  Severe or worsening pain in your legs or feet or in your abdomen back or chest. Increased pain, redness, drainage (pus) from your incision sit. Increased abdominal pain, bloating, nausea, vomiting or persistent diarrhea. Fever of 101 degrees or higher. Swelling in your leg (s),  Reduce your risk of vascular disease  Stop smoking. If you would like help call [facility] at 1-800-QUIT-NOW (1-800-784-8669) or [place] at [phone]. Manage your cholesterol Maintain a desired weight Control your diabetes Keep your blood pressure down  If you have questions, please call the office at [phone].   

## 2022-08-19 ENCOUNTER — Encounter: Payer: Self-pay | Admitting: Student in an Organized Health Care Education/Training Program

## 2022-08-19 ENCOUNTER — Ambulatory Visit (INDEPENDENT_AMBULATORY_CARE_PROVIDER_SITE_OTHER): Payer: 59 | Admitting: Student in an Organized Health Care Education/Training Program

## 2022-08-19 ENCOUNTER — Telehealth: Payer: Self-pay

## 2022-08-19 VITALS — BP 110/65 | HR 54 | Temp 98.7°F | Ht 72.0 in | Wt 141.0 lb

## 2022-08-19 DIAGNOSIS — E43 Unspecified severe protein-calorie malnutrition: Secondary | ICD-10-CM | POA: Diagnosis not present

## 2022-08-19 DIAGNOSIS — I7142 Juxtarenal abdominal aortic aneurysm, without rupture: Secondary | ICD-10-CM | POA: Diagnosis not present

## 2022-08-19 DIAGNOSIS — I10 Essential (primary) hypertension: Secondary | ICD-10-CM | POA: Diagnosis not present

## 2022-08-19 DIAGNOSIS — R339 Retention of urine, unspecified: Secondary | ICD-10-CM

## 2022-08-19 LAB — COMPREHENSIVE METABOLIC PANEL
ALT: 14 U/L (ref 0–44)
AST: 30 U/L (ref 15–41)
Albumin: 3 g/dL — ABNORMAL LOW (ref 3.5–5.0)
Alkaline Phosphatase: 63 U/L (ref 38–126)
Anion gap: 11 (ref 5–15)
BUN: 14 mg/dL (ref 8–23)
CO2: 23 mmol/L (ref 22–32)
Calcium: 8.4 mg/dL — ABNORMAL LOW (ref 8.9–10.3)
Chloride: 102 mmol/L (ref 98–111)
Creatinine, Ser: 1.3 mg/dL — ABNORMAL HIGH (ref 0.61–1.24)
GFR, Estimated: 57 mL/min — ABNORMAL LOW (ref >60)
Glucose, Bld: 102 mg/dL — ABNORMAL HIGH (ref 70–99)
Potassium: 3.6 mmol/L (ref 3.5–5.1)
Sodium: 136 mmol/L (ref 135–145)
Total Bilirubin: 1 mg/dL (ref 0.3–1.2)
Total Protein: 6.9 g/dL (ref 6.5–8.1)

## 2022-08-19 LAB — CBC
HCT: 32.9 % — ABNORMAL LOW (ref 39.0–52.0)
Hemoglobin: 10.2 g/dL — ABNORMAL LOW (ref 13.0–17.0)
MCH: 29.3 pg (ref 26.0–34.0)
MCHC: 31 g/dL (ref 30.0–36.0)
MCV: 94.5 fL (ref 80.0–100.0)
Platelets: 97 10*3/uL — ABNORMAL LOW (ref 150–400)
RBC: 3.48 MIL/uL — ABNORMAL LOW (ref 4.22–5.81)
RDW: 16.5 % — ABNORMAL HIGH (ref 11.5–15.5)
WBC: 9.1 10*3/uL (ref 4.0–10.5)
nRBC: 0 % (ref 0.0–0.2)

## 2022-08-19 LAB — POCT ACTIVATED CLOTTING TIME
Activated Clotting Time: 266 seconds
Activated Clotting Time: 222 seconds
Activated Clotting Time: 233 seconds
Activated Clotting Time: 233 seconds
Activated Clotting Time: 228 seconds
Activated Clotting Time: 222 seconds
Activated Clotting Time: 228 seconds

## 2022-08-19 MED ORDER — ASPIRIN 81 MG PO TBEC: 81.0000 mg | DELAYED_RELEASE_TABLET | Freq: Every day | ORAL | 3 refills | Status: DC

## 2022-08-19 MED ORDER — MIRTAZAPINE 7.5 MG PO TABS: 15.0000 mg | ORAL_TABLET | Freq: Every day | ORAL | 1 refills | Status: DC

## 2022-08-19 NOTE — Assessment & Plan Note (Addendum)
Patient had juxtarenal endovascular repair of the abdominal aortic aneurysm on Friday with Dr. Trula Slade and seemed to do well postoperatively.  He was discharged to home on Sunday in good condition with normal blood pressure.  Coming into clinic this morning with asymptomatic hypotension, first blood pressure low at 88/45, on repeat it is 98/44 with a MAP of 58.  He is currently asymptomatic, not eating very well or drinking very well.  Appears moderately dehydrated.  The incisions over bilateral femoral arteries are reassuring, there is no sign of active bleeding, there is no hematoma, both arteries are easily palpable, extremities are warm.  Some decreased skin turgor is consistent with dehydration.  Retroperitoneal bleeding is possible given the endovascular repair, he has no back or abdominal pain to suggest this.  We are going to hydrate the patient in the clinic, check stat CBC and CMP and recheck his blood pressure in about 20 minutes.  Blood pressure much improved after drinking 24 ounces of water.  Labs are reassuring, hemoglobin 10.2 g, up 1 g from just yesterday.  So I think this is all consistent with dehydration due to poor oral intake.  We talked about increasing hydration at home, he says he is going to work on this.  Drink water great for [place], I think he just needs more frequent reminders.  He is going to pick up aspirin and Plavix from the pharmacy and start those today.  He is at increased risk of bleeding complications, this happened when we tried Xarelto in the past.  He understands to call me if he has any blood in his stools or to come straight to the emergency departments.  Follow-up with me in 4 weeks.  Patient has high degree of frailty due to advanced age, comorbidities, malnutrition, will be at risk for poor outcome despite our best efforts.

## 2022-08-19 NOTE — Assessment & Plan Note (Signed)
Hypotensive today following surgical repair of abdominal aortic aneurysm.  In the setting of poor oral intake and chronic malnutrition.  Plan is to discontinue amlodipine at this time.  If he becomes hypertensive again in the future as he recovers from this operation can restart amlodipine as needed.

## 2022-08-19 NOTE — Assessment & Plan Note (Signed)
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  Has a suprapubic catheter in place which was exchanged in the hospital a few days ago.  It is productive of urine into a leg bag, clear with no hematuria.  We are going to have to discontinue tamsulosin because of hypotension today, blood pressure is 88/45.  If blood pressure normalizes in the future as he recovers more from the operation can consider restarting the Flomax.  However I am not sure it is adding much value now that he is committed to indefinite urinary diversion through his suprapubic catheter.

## 2022-08-19 NOTE — Discharge Summary (Signed)
[name] 10-26-1945 77 y.o. male  MRN: [id number]  Admission Date: 08/16/2022  Discharge Date: 08/18/22  Physician: Dr. Trula Slade  Admission Diagnosis: S/P AAA repair [id number], Z86.79]  Discharge Day services:   See progress note 08/18/2022  Hospital Course:  [name] is a 77 year old male with a juxtarenal aortic aneurysm who was brought in as an outpatient and underwent fenestrated juxtarenal abdominal aortic aneurysm repair with stenting of bilateral renal arteries, right internal iliac fenestration with right internal iliac stenting by Dr. Trula Slade on 08/16/2022.  He tolerated the procedure well and was admitted postoperatively.  Intraoperatively and postoperatively he experienced bradycardia with heart rate into the low 30s.  He received a dose of metoprolol postoperatively and heart rate remained in the 30s and even dipped into the high 20s.  For the remainder of his hospital stay beta-blockade was held and heart rate is responded appropriately with averages in the 50s and 60s.  Groin incisions were without hematoma and healing well.  Bilateral lower extremities for well-perfused throughout hospital stay.  Beta-blockade will continue to be held after discharge until he follows up with his cardiologist.  He will follow-up with Dr. Trula Slade in 1 month with CTA abdomen and pelvis.  He was prescribed aspirin and Plavix to take daily in addition to a statin.  He was discharged home in stable condition.   CBC    Component Value Date/Time   WBC 7.2 08/18/2022 0151   RBC 3.04 (L) 08/18/2022 0151   HGB 9.2 (L) 08/18/2022 0151   HGB 11.9 (L) 05/20/2022 1007   HGB 13.7 05/06/2017 0926   HCT 28.4 (L) 08/18/2022 0151   HCT 37.0 (L) 05/20/2022 1007   HCT 41.9 05/06/2017 0926   PLT 100 (L) 08/18/2022 0151   PLT 157 05/20/2022 1007   MCV 93.4 08/18/2022 0151   MCV 91 05/20/2022 1007   MCV 100.7 (H) 05/06/2017 0926   MCH 30.3 08/18/2022 0151   MCHC 32.4  08/18/2022 0151   RDW 16.7 (H) 08/18/2022 0151   RDW 14.6 05/20/2022 1007   RDW 14.5 05/06/2017 0926   LYMPHSABS 1.2 09/04/2021 1124   LYMPHSABS 1.0 05/06/2017 0926   MONOABS 0.3 09/04/2021 1124   MONOABS 0.4 05/06/2017 0926   EOSABS 0.1 09/04/2021 1124   EOSABS 0.1 05/06/2017 0926   BASOSABS 0.1 09/04/2021 1124   BASOSABS 0.0 05/06/2017 0926    BMET    Component Value Date/Time   NA 134 (L) 08/18/2022 0151   NA 140 05/20/2022 1007   NA 142 05/06/2017 0926   K 4.1 08/18/2022 0151   K 4.0 05/06/2017 0926   CL 104 08/18/2022 0151   CL 101 01/25/2011 0901   CO2 24 08/18/2022 0151   CO2 27 05/06/2017 0926   GLUCOSE 98 08/18/2022 0151   GLUCOSE 108 05/06/2017 0926   GLUCOSE 112 01/25/2011 0901   BUN 16 08/18/2022 0151   BUN 11 05/20/2022 1007   BUN 14.6 05/06/2017 0926   CREATININE 1.24 08/18/2022 0151   CREATININE 1.1 05/06/2017 0926   CALCIUM 8.3 (L) 08/18/2022 0151   CALCIUM 9.7 05/06/2017 0926   GFRNONAA >60 08/18/2022 0151   GFRNONAA >89 12/29/2014 0910   GFRAA >60 02/14/2020 1020   GFRAA >89 12/29/2014 0910         Discharge Diagnosis:  S/P AAA repair [id number], Z86.79]  Secondary Diagnosis: Patient Active Problem List   Diagnosis Date Noted   S/P AAA repair 08/16/2022   Emphysema lung (  [place]) 03/05/2022   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   Adenocarcinoma of prostate ([place]) 05/02/2021   Atrial fibrillation ([place]) 03/05/2021   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[place]) 11/08/2020   Protein-calorie malnutrition, severe 11/07/2020   Urinary retention 04/02/2018   Mild alcohol use disorder 12/09/2016   Premature atrial contractions 07/31/2015   Chronic use of opiate for therapeutic purpose 05/01/2015   Atherosclerosis of aorta ([place]) 01/13/2015   Prediabetes 12/30/2014   AAA (abdominal aortic aneurysm) ([place])    Healthcare maintenance 06/30/2012   CAD (coronary artery disease) 03/17/2012   Erectile dysfunction 10/12/2010   Arthropathy of pelvic region and  thigh 05/02/2008   CKD (chronic kidney disease) stage 2, GFR 60-89 ml/min 08/07/2007   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   Gout 04/18/2006   Tobacco use disorder 04/18/2006   Essential hypertension 04/18/2006   GERD 04/18/2006   Past Medical History:  Diagnosis Date   AAA (abdominal aortic aneurysm) ([place])    followed by vasc surgery   Alcohol abuse    Barretts esophagus    bx distal esophagus 2011 neg    Bladder outlet obstruction 04/02/2018   CAD in native artery    a. s/p stent to prox RCA and mid Cx.   Cataract    Cholelithiasis    Chronic osteomyelitis involving ankle and foot, right ([place]) 07/13/2018   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   2006 - 3 adenomas largest 12 mm  2008 - no polyps 2013-  2 mm cecal polyp, adenoma, routine repeat colonoscopy 02/2017 approx 05/14/2017 18 polyps max 12 mm ALL adenomas recall 2019 [name], MD, [facility]      Dieulafoy lesion of duodenum    Diverticulosis of colon    DJD (degenerative joint disease)    left hip   Full dentures    upper and lower   GERD (gastroesophageal reflux disease)    diet controlled    Gout    feet   Hiatal hernia    2 cm noted on upper endos 2011    Hx of adenomatous colonic polyps    Hyperlipidemia    Hypertension    Internal hemorrhoid    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 pt was on Plavix x 3 years then transitioned to ASA, no problems since   Non-small cell carcinoma of right lung, stage 1 ([place]) 01/30/2016   Renal failure, acute (HCC)    hx of 2/2 medication, resolved   Tobacco abuse    smoker .5 ppd x 55 yrs   UTI (lower urinary tract infection)      Allergies as of 08/18/2022       Reactions   Candesartan Cilexetil-hctz Other (See Comments)   Acute renal failure   Hydrochlorothiazide Other (See Comments)   Acute renal failure   Shellfish Allergy Other (See Comments)    Gout occurs   Betadine [povidone Iodine] Itching        Medication List     STOP taking these medications    metoprolol succinate 100 MG 24 hr tablet Commonly known as: TOPROL-XL   metoprolol succinate 50 MG 24 hr tablet Commonly known as: TOPROL-XL       TAKE these medications    acetaminophen 650 MG CR tablet Commonly known as: Tylenol 8 Hour Take 1 tablet (650 mg total) by mouth every 8 (eight) hours as needed  for pain or fever. What changed: when to take this   albuterol 108 (90 Base) MCG/ACT inhaler Commonly known as: ProAir HFA Inhale 1-2 puffs into the lungs every 6 (six) hours as needed for wheezing or shortness of breath.   amLODipine 10 MG tablet Commonly known as: NORVASC Take 1 tablet (10 mg total) by mouth daily.   ascorbic acid 500 MG tablet Commonly known as: VITAMIN C Take 500 mg by mouth daily.   aspirin EC 81 MG tablet Take 1 tablet (81 mg total) by mouth daily at 6 (six) AM. Swallow whole.   chromagen capsule Take 1 capsule by mouth daily.   clopidogrel 75 MG tablet Commonly known as: PLAVIX Take 1 tablet (75 mg total) by mouth daily.   escitalopram 5 MG tablet Commonly known as: LEXAPRO TAKE 1 TABLET (5 MG TOTAL) BY MOUTH DAILY.   HYDROcodone-acetaminophen 7.5-325 MG tablet Commonly known as: NORCO Take 1 tablet by mouth 2 (two) times daily as needed for moderate pain.   mirtazapine 7.5 MG tablet Commonly known as: REMERON TAKE 1 TABLET BY MOUTH AT BEDTIME.   pantoprazole 40 MG tablet Commonly known as: PROTONIX TAKE 1 TABLET EVERY DAY FOR REFLUX   rosuvastatin 40 MG tablet Commonly known as: CRESTOR Take 1 tablet (40 mg total) by mouth daily.   rosuvastatin 20 MG tablet Commonly known as: CRESTOR Take 20 mg by mouth daily.   senna 8.6 MG Tabs tablet Commonly known as: SENOKOT Take 2 tablets (17.2 mg total) by mouth in the morning and at bedtime. What changed: how much to take   tamsulosin 0.4 MG Caps capsule Commonly  known as: FLOMAX Take 2 capsules (0.8 mg total) by mouth every evening.   Zenpep 20000-63000 units Cpep Generic drug: Pancrelipase (Lip-Prot-Amyl) Take 3 capsules by mouth 3 (three) times daily before meals.        Discharge Instructions:   [facility]  Discharge Instructions Endovascular Aortic Aneurysm Repair  Please refer to the following instructions for your post-procedure care. Your surgeon or Physician Assistant will discuss any changes with you.  Activity  You are encouraged to walk as much as you can. You can slowly return to normal activities but must avoid strenuous activity and heavy lifting until your doctor tells you it's OK. Avoid activities such as vacuuming or swinging a gold club. It is normal to feel tired for several weeks after your surgery. Do not drive until your doctor gives the OK and you are no longer taking prescription pain medications. It is also normal to have difficulty with sleep habits, eating, and bowel movements after surgery. These will go away with time.  Bathing/Showering  You may shower after you go home. If you have an incision, do not soak in a bathtub, hot tub, or swim until the incision heals completely.  Incision Care  Shower every day. Clean your incision with mild soap and water. Pat the area dry with a clean towel. You do not need a bandage unless otherwise instructed. Do not apply any ointments or creams to your incision. If you clothing is irritating, you may cover your incision with a dry gauze pad.  Diet  Resume your normal diet. There are no special food restrictions following this procedure. A low fat/low cholesterol diet is recommended for all patients with vascular disease. In order to heal from your surgery, it is CRITICAL to get adequate nutrition. Your body requires vitamins, minerals, and protein. Vegetables are the best source  of vitamins and minerals. Vegetables also provide the perfect  balance of protein. Processed food has little nutritional value, so try to avoid this.  Medications  Resume taking all of your medications unless your doctor or Physician Assistnat tells you not to. If your incision is causing pain, you may take over-the-counter pain relievers such as acetaminophen (Tylenol). If you were prescribed a stronger pain medication, please be aware these medications can cause nausea and constipation. Prevent nausea by taking the medication with a snack or meal. Avoid constipation by drinking plenty of fluids and eating foods with a high amount of fiber, such as fruits, vegetables, and grains. Do not take Tylenol if you are taking prescription pain medications.   Follow up  [place] office will schedule a follow-up appointment with a C.T. scan 3-4 weeks after your surgery.  Please call us immediately for any of the following conditions  Severe or worsening pain in your legs or feet or in your abdomen back or chest. Increased pain, redness, drainage (pus) from your incision sit. Increased abdominal pain, bloating, nausea, vomiting or persistent diarrhea. Fever of 101 degrees or higher. Swelling in your leg (s),  Reduce your risk of vascular disease  Stop smoking. If you would like help call [facility] at 1-800-QUIT-NOW [phone]) or [place] at [phone]. Manage your cholesterol Maintain a desired weight Control your diabetes Keep your blood pressure down  If you have questions, please call the office at [phone].     Disposition: home  Patient's condition: is Good  Follow up: 1. Dr. Trula Slade in 4 weeks with CTA protocol   Dagoberto Ligas, PA-C [facility] [phone] 08/19/2022  8:50 AM   - For VQI Registry use - Post-op:  Time to Extubation: '[x]'$  In OR, '[ ]'$  < 12 hrs, '[ ]'$  12-24 hrs, '[ ]'$  >=24 hrs Vasopressors Req. Post-op: No MI: No., '[ ]'$  Troponin only, '[ ]'$  EKG or Clinical New Arrhythmia: No CHF: No ICU Stay: 0  days Transfusion: No   Complications: Resp failure: No., '[ ]'$  Pneumonia, '[ ]'$  Ventilator Chg in renal function: No., '[ ]'$  Inc. Cr > 0.5, '[ ]'$  Temp. Dialysis,  '[ ]'$  Permanent dialysis Leg ischemia: No., no Surgery needed, '[ ]'$  Yes, Surgery needed,  '[ ]'$  Amputation Bowel ischemia: No., '[ ]'$  Medical Rx, '[ ]'$  Surgical Rx Wound complication: No., '[ ]'$  Superficial separation/infection, '[ ]'$  Return to OR Return to OR: No  Return to OR for bleeding: No Stroke: No., '[ ]'$  Minor, '[ ]'$  Major  Discharge medications: Statin use:  Yes  ASA use:  Yes  Plavix use:  Yes  Beta blocker use:  Yes  ARB use:  No ACEI use:  No CCB use:  Yes

## 2022-08-19 NOTE — Anesthesia Postprocedure Evaluation (Signed)
Anesthesia Post Note  Patient: [name]  Procedure(s) Performed: ABDOMINAL AORTIC ENDOVASCULAR FENESTRATED STENT GRAFT (Groin) INSERTION OF ILIAC STENT RIGHT EXTERNAL ARTERY, LEFT COMMON ARTERY, AND RIGHT COMMON ARTERY (Bilateral: Groin) ENDOVASCULAR STENT GRAFT INSERTION OF BILATERAL RENAL ARTERIES (Bilateral: Groin) ULTRASOUND GUIDANCE FOR VASCULAR ACCESS (Bilateral: Groin)     Patient location during evaluation: PACU Anesthesia Type: General Level of consciousness: sedated and patient cooperative Pain management: pain level controlled Vital Signs Assessment: post-procedure vital signs reviewed and stable Respiratory status: spontaneous breathing Cardiovascular status: stable Anesthetic complications: no  No notable events documented.  Last Vitals:  Vitals:   08/18/22 0410 08/18/22 0815  BP: (!) 145/60 (!) 152/58  Pulse: 64 71  Resp: 19 17  Temp: 37.6 C 37.1 C  SpO2: 91% 90%    Last Pain:  Vitals:   08/18/22 1006  TempSrc:   PainSc: [name]

## 2022-08-19 NOTE — Transitions of Care (Post Inpatient/ED Visit) (Signed)
   08/19/2022  Name: [name] MRN: [id number] DOB: Nov 10, 1945  Today's TOC FU Call Status: Today's TOC FU Call Status:: Successful TOC FU Call Competed TOC FU Call Complete Date: 08/19/22  Transition Care Management Follow-up Telephone Call Date of Discharge: 08/18/22 Dis[facility]) Type of Discharge: Inpatient Admission Primary Inpatient Discharge Diagnosis:: Juxtarenal Aortic Aneurysm Repair How have you been since you were released from the hospital?: Better Any questions or concerns?: No  Items Reviewed: Did you receive and understand the discharge instructions provided?: Yes Medications obtained and verified?: Yes (Medications Reviewed) Any new allergies since your discharge?: No Dietary orders reviewed?: No Do you have support at home?: Yes People in Home: sibling(s) Name of Support/Comfort Primary Source: Brother and Sister- patient lives alone  Home Care and Equipment/Supplies: [place] Ordered?: No Any new equipment or medical supplies ordered?: No  Functional Questionnaire: Do you need assistance with bathing/showering or dressing?: No Do you need assistance with meal preparation?: No Do you need assistance with eating?: No Do you have difficulty maintaining continence: No Do you need assistance with getting out of bed/getting out of a chair/moving?: No Do you have difficulty managing or taking your medications?: No  Folllow up appointments reviewed: PCP Follow-up appointment confirmed?: Yes Date of PCP follow-up appointment?: 08/19/22 Follow-up Provider: Dr. Evette Doffing [facility] Follow-up appointment confirmed?: Yes Date of Specialist follow-up appointment?: 09/12/22 Follow-Up Specialty Provider:: Dr. Scot Dock Do you need transportation to your follow-up appointment?: No Do you understand care options if your condition(s) worsen?: Yes-patient verbalized understanding  SDOH Interventions Today    Flowsheet Row  Most Recent Value  SDOH Interventions   Food Insecurity Interventions Intervention Not Indicated  Housing Interventions Intervention Not Indicated  Financial Strain Interventions Intervention Not Indicated      Johnney Killian, RN, BSN, CCM Care Management Coordinator [facility] Phone: [phone]: [phone]

## 2022-08-19 NOTE — Progress Notes (Signed)
Established Patient Office Visit  Subjective   Patient ID: [name], male    DOB: 03-Jan-1946  Age: 77 y.o. MRN: [id number]  Chief Complaint  Patient presents with   Hospitalization Follow-up   Anorexia    HPI  77 year old person living with significant frailty, malnutrition, here for routine follow-up appointments that was already scheduled.  He happened to have had his abdominal aortic aneurysm repaired on Friday and was released from the hospital just yesterday.  He comes into our clinic this morning hypotensive with a blood pressure of 88/48.  Despite this he is asymptomatic, denies lightheadedness or dizziness.  He has had no falls.  Denies chest pain or shortness of breath.  Denies any bleeding in any of the incision sites.  No bleeding in his urinary catheter bag.  He reports not having a good appetite since about Thursday.  Says he did not eat much food in the hospital, may be a fruit cup.  Says he has been drinking some juice since release.  Denies any dysphagia.  Has good access to food, independent in his activities of daily living per his report, but has to take his time and uses a cane.    Objective:     BP 110/65 (BP Location: Right Arm, Patient Position: Sitting, Cuff Size: Small)   Pulse (!) 54   Temp 98.7 F (37.1 C) (Oral)   Ht 6' (1.829 m)   Wt 141 lb (64 kg)   SpO2 96%   BMI 19.12 kg/m    Physical Exam  Gen: Chronically ill-appearing man, sitting in a wheelchair, mentating normally Neuro: Alert, conversational, full strength in the upper and lower extremities Ext: Bilateral femoral catheterization sites appear normal, no hematoma, no bleeding, femoral pulses are easily palpable.  He is get a small hematoma in his right radial artery from what appears to be a blood gas draw.  This is very small in his hand distally is warm and well-perfused. CV: Regular rate and rhythm, 3 out of 6 early systolic murmur at the right upper sternal border Resp: Unlabored,  clear to auscultation throughout with no crackles or wheezing  Results for orders placed or performed in visit on 08/19/22  CMP w Anion Gap (STAT/Sunquest-performed on-site)  Result Value Ref Range   Sodium 136 135 - 145 mmol/L   Potassium 3.6 3.5 - 5.1 mmol/L   Chloride 102 98 - 111 mmol/L   CO2 23 22 - 32 mmol/L   Glucose, Bld 102 (H) 70 - 99 mg/dL   BUN 14 8 - 23 mg/dL   Creatinine, Ser 1.30 (H) 0.61 - 1.24 mg/dL   Calcium 8.4 (L) 8.9 - 10.3 mg/dL   Total Protein 6.9 6.5 - 8.1 g/dL   Albumin 3.0 (L) 3.5 - 5.0 g/dL   AST 30 15 - 41 U/L   ALT 14 0 - 44 U/L   Alkaline Phosphatase 63 38 - 126 U/L   Total Bilirubin 1.0 0.3 - 1.2 mg/dL   GFR, Estimated 57 (L) >60 mL/min   Anion gap 11 5 - 15  CBC no Diff  Result Value Ref Range   WBC 9.1 4.0 - 10.5 K/uL   RBC 3.48 (L) 4.22 - 5.81 MIL/uL   Hemoglobin 10.2 (L) 13.0 - 17.0 g/dL   HCT 32.9 (L) 39.0 - 52.0 %   MCV 94.5 80.0 - 100.0 fL   MCH 29.3 26.0 - 34.0 pg   MCHC 31.0 30.0 - 36.0 g/dL   RDW 16.5 (  H) 11.5 - 15.5 %   Platelets 97 (L) 150 - 400 K/uL   nRBC 0.0 0.0 - 0.2 %       Assessment & Plan:   Problem List Items Addressed This Visit       High   Protein-calorie malnutrition, severe - Primary (Chronic)    Severe protein calorie malnutrition, weight is 141 pounds with a BMI of 19.  Patient reports low appetite's, especially worse over the last 1 week.  This is in the setting of hospitalization for endovascular repair of abdominal aortic aneurysm.  He has not lost any weight since I last saw him in December.  He has good access to food at home, no dysphagia or structural reasons for not eating.  We talked about restarting Ensure which she is agreeable to.  We talked about continuing pancrelipase enzymes with meals.  Can also increase mirtazapine from 7.5 mg daily to 15 mg daily.        Medium    Essential hypertension (Chronic)    Hypotensive today following surgical repair of abdominal aortic aneurysm.  In the setting of  poor oral intake and chronic malnutrition.  Plan is to discontinue amlodipine at this time.  If he becomes hypertensive again in the future as he recovers from this operation can restart amlodipine as needed.      Relevant Medications   aspirin EC 81 MG tablet   AAA (abdominal aortic aneurysm) (HCC) (Chronic)    Patient had juxtarenal endovascular repair of the abdominal aortic aneurysm on Friday with Dr. Trula Slade and seemed to do well postoperatively.  He was discharged to home on Sunday in good condition with normal blood pressure.  Coming into clinic this morning with asymptomatic hypotension, first blood pressure low at 88/45, on repeat it is 98/44 with a MAP of 58.  He is currently asymptomatic, not eating very well or drinking very well.  Appears moderately dehydrated.  The incisions over bilateral femoral arteries are reassuring, there is no sign of active bleeding, there is no hematoma, both arteries are easily palpable, extremities are warm.  Some decreased skin turgor is consistent with dehydration.  Retroperitoneal bleeding is possible given the endovascular repair, he has no back or abdominal pain to suggest this.  We are going to hydrate the patient in the clinic, check stat CBC and CMP and recheck his blood pressure in about 20 minutes.  Blood pressure much improved after drinking 24 ounces of water.  Labs are reassuring, hemoglobin 10.2 g, up 1 g from just yesterday.  So I think this is all consistent with dehydration due to poor oral intake.  We talked about increasing hydration at home, he says he is going to work on this.  Drink water great for [place], I think he just needs more frequent reminders.  He is going to pick up aspirin and Plavix from the pharmacy and start those today.  He is at increased risk of bleeding complications, this happened when we tried Xarelto in the past.  He understands to call me if he has any blood in his stools or to come straight to the emergency departments.   Follow-up with me in 4 weeks.  Patient has high degree of frailty due to advanced age, comorbidities, malnutrition, will be at risk for poor outcome despite our best efforts.      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  Has a suprapubic catheter in place which was exchanged in the hospital a few days ago.  It is productive of urine into a leg bag, clear with no hematuria.  We are going to have to discontinue tamsulosin because of hypotension today, blood pressure is 88/45.  If blood pressure normalizes in the future as he recovers more from the operation can consider restarting the Flomax.  However I am not sure it is adding much value now that he is committed to indefinite urinary diversion through his suprapubic catheter.       Return in about 4 weeks (around 09/16/2022).    Axel Filler, MD

## 2022-08-19 NOTE — Assessment & Plan Note (Signed)
Severe protein calorie malnutrition, weight is 141 pounds with a BMI of 19.  Patient reports low appetite's, especially worse over the last 1 week.  This is in the setting of hospitalization for endovascular repair of abdominal aortic aneurysm.  He has not lost any weight since I last saw him in December.  He has good access to food at home, no dysphagia or structural reasons for not eating.  We talked about restarting Ensure which she is agreeable to.  We talked about continuing pancrelipase enzymes with meals.  Can also increase mirtazapine from 7.5 mg daily to 15 mg daily.

## 2022-08-21 ENCOUNTER — Encounter (HOSPITAL_COMMUNITY): Payer: Self-pay | Admitting: Surgery

## 2022-08-21 LAB — TYPE AND SCREEN
ABO/RH(D): O POS
Antibody Screen: NEGATIVE
Unit division: 0
Unit division: 0

## 2022-08-21 LAB — BPAM RBC
Blood Product Expiration Date: 202403162359
Blood Product Expiration Date: 202403232359
Unit Type and Rh: 9500
Unit Type and Rh: 9500

## 2022-09-05 ENCOUNTER — Other Ambulatory Visit: Payer: Self-pay

## 2022-09-05 DIAGNOSIS — I714 Abdominal aortic aneurysm, without rupture, unspecified: Secondary | ICD-10-CM

## 2022-09-06 ENCOUNTER — Other Ambulatory Visit: Payer: Self-pay | Admitting: *Deleted

## 2022-09-06 DIAGNOSIS — Z79891 Long term (current) use of opiate analgesic: Secondary | ICD-10-CM

## 2022-09-06 DIAGNOSIS — M25552 Pain in left hip: Secondary | ICD-10-CM

## 2022-09-06 MED ORDER — HYDROCODONE-ACETAMINOPHEN 7.5-325 MG PO TABS: 1.0000 | ORAL_TABLET | Freq: Two times a day (BID) | ORAL | 0 refills | Status: DC | PRN

## 2022-09-06 NOTE — Telephone Encounter (Signed)
Last rx written - 08/08/22. Last OV - 08/19/22. Next OV - 09/23/22. TOX - 11/08/19.

## 2022-09-10 ENCOUNTER — Ambulatory Visit (INDEPENDENT_AMBULATORY_CARE_PROVIDER_SITE_OTHER): Payer: 59 | Admitting: Student

## 2022-09-10 ENCOUNTER — Encounter: Payer: Self-pay | Admitting: Student

## 2022-09-10 ENCOUNTER — Ambulatory Visit (INDEPENDENT_AMBULATORY_CARE_PROVIDER_SITE_OTHER): Payer: 59

## 2022-09-10 VITALS — BP 112/64 | HR 63 | Temp 97.8°F | Ht 72.0 in | Wt 121.3 lb

## 2022-09-10 DIAGNOSIS — E43 Unspecified severe protein-calorie malnutrition: Secondary | ICD-10-CM

## 2022-09-10 DIAGNOSIS — Z8669 Personal history of other diseases of the nervous system and sense organs: Secondary | ICD-10-CM

## 2022-09-10 DIAGNOSIS — Z87898 Personal history of other specified conditions: Secondary | ICD-10-CM

## 2022-09-10 DIAGNOSIS — Z0000 Encounter for general adult medical examination without abnormal findings: Secondary | ICD-10-CM | POA: Diagnosis not present

## 2022-09-10 LAB — COMPREHENSIVE METABOLIC PANEL
ALT: 20 U/L (ref 0–44)
AST: 23 U/L (ref 15–41)
Albumin: 2.9 g/dL — ABNORMAL LOW (ref 3.5–5.0)
Alkaline Phosphatase: 72 U/L (ref 38–126)
Anion gap: 10 (ref 5–15)
BUN: 20 mg/dL (ref 8–23)
CO2: 25 mmol/L (ref 22–32)
Calcium: 9.3 mg/dL (ref 8.9–10.3)
Chloride: 100 mmol/L (ref 98–111)
Creatinine, Ser: 1.26 mg/dL — ABNORMAL HIGH (ref 0.61–1.24)
GFR, Estimated: 59 mL/min — ABNORMAL LOW (ref >60)
Glucose, Bld: 111 mg/dL — ABNORMAL HIGH (ref 70–99)
Potassium: 4.2 mmol/L (ref 3.5–5.1)
Sodium: 135 mmol/L (ref 135–145)
Total Bilirubin: 1 mg/dL (ref 0.3–1.2)
Total Protein: 7.8 g/dL (ref 6.5–8.1)

## 2022-09-10 LAB — CBC WITH DIFFERENTIAL/PLATELET
Abs Immature Granulocytes: 0.02 10*3/uL (ref 0.00–0.07)
Basophils Absolute: 0 10*3/uL (ref 0.0–0.1)
Basophils Relative: 0 %
Eosinophils Absolute: 0 10*3/uL (ref 0.0–0.5)
Eosinophils Relative: 1 %
HCT: 36.2 % — ABNORMAL LOW (ref 39.0–52.0)
Hemoglobin: 10.6 g/dL — ABNORMAL LOW (ref 13.0–17.0)
Immature Granulocytes: 0 %
Lymphocytes Relative: 17 %
Lymphs Abs: 0.9 10*3/uL (ref 0.7–4.0)
MCH: 27.6 pg (ref 26.0–34.0)
MCHC: 29.3 g/dL — ABNORMAL LOW (ref 30.0–36.0)
MCV: 94.3 fL (ref 80.0–100.0)
Monocytes Absolute: 0.5 10*3/uL (ref 0.1–1.0)
Monocytes Relative: 10 %
Neutro Abs: 3.7 10*3/uL (ref 1.7–7.7)
Neutrophils Relative %: 72 %
Platelets: 152 10*3/uL (ref 150–400)
RBC: 3.84 MIL/uL — ABNORMAL LOW (ref 4.22–5.81)
RDW: 16.3 % — ABNORMAL HIGH (ref 11.5–15.5)
WBC: 5.2 10*3/uL (ref 4.0–10.5)
nRBC: 0 % (ref 0.0–0.2)

## 2022-09-10 LAB — PHOSPHORUS: Phosphorus: 2.5 mg/dL (ref 2.5–4.6)

## 2022-09-10 LAB — MAGNESIUM: Magnesium: 2.5 mg/dL — ABNORMAL HIGH (ref 1.7–2.4)

## 2022-09-10 NOTE — Assessment & Plan Note (Addendum)
[name] presents today for persistent weight loss. He has a history of severe protein calorie malnutrition with unknown etiology. He has been treated for pancreatic insufficiency with some improvement initially. Recently his PCP increased his mirtazepine from 7.5 mg to 15 mg daily which he has not seen improvement with. He endorses adherence to both his mirtazepine and pancreatic enzyme replacement. His sister and family bring him food, access to meals does not seem to be contributing. He denies dysphagia or difficulty swallowing, do not suspect structural reason for his poor appetite.  On today's visit he is down 20 lbs in the last 2-3 weeks. It seems to be mostly a lack of interest in food and/or a low appetite drive. He lives alone and despite company of his family still has no interest in eating. He felt he was doing well until 2 weeks ago, he isn't certain what has changed.   When asked about his diet he notes he will put food in his mouth and eventually spit it out. He noticed drenching night sweats over the last week and occasional loose stools. Denies nausea, vomiting, chest or abdominal pain.He believes a short term rehabilitation facility where he could regain his strength would be beneficial, he recalls his last hospitalization where he went to a SNF and improvement in his strength and appetite.   PHQ of 23 today. Last recorded December 4th of 0. He avoided eye contact throughout the visit and had a significantly flat affect. He is chronically ill appearing and cachetic with temporal muscle wasting.   While he has many risk factors for malignancy including an ampullary adenoma, prior pulmonary nodule that seems to have resolved on recent CT imaging, and prostate cancer, he is clearly depressed and anhedonic. His sister mentions every time she comes to the house he is lying in bed. He denies doing anything throughout the day. I enquired about him moving in with family to help support him, he  denies wanting to do this. His son in [place] has been asking the patient to move in with him, but [name] does not want to move to [place]. He is only interested in a short term care facility. With him doing well at a SNF prior and having weight gain, suspicious for his depression being a large contributing factor.   Will check CBC, BMP, phosphate and magnesium today and repeat CT chest with contrast to assess for any new nodules with his extensive tobacco use history (prior nodule was enlarging and then he had resolution on ct imaging without contrast). Will need to continue follow up with Dr. 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 He reports no deficiencies in ADL today and I do not believe he qualifies for SNF. He is able to do these without difficulties.   Do not believe additional antidepressant would be helpful and would worry about interactions between current regimen. Can consider taper off of escitalopram and starting SNRI for anhedonia benefit. He has follow up with his PCP in 2 weeks and can discuss then. Can also consider brain imaging to assess for structural abnormalities that may be contributing his symptoms.   Addendum: CBC with chronic normocytic anemia and CMP without evidence of worsening renal function. Phosphorus and magnesium unremarkable. Will call and update tomorrow, do not believe he requires hospital admission but will follow up with his PCP in 2 weeks. Will also offer counseling for his depression. Follow up CT imaging

## 2022-09-10 NOTE — Patient Instructions (Signed)
Thank you, [name] for allowing [place] to provide your care today. Today we discussed .  Weight loss Lack of hunger  We will be doing basic lab work today and I would like to repeat the scan of your chest. If any of these are abnormal we will call to admit you. I will work on getting you to a facility from our end.     I have ordered the following labs for you:   Lab Orders         CMP w [place] (STAT/Sunquest-performed on-site)         CBC with Diff      Referrals ordered today:    Referral Orders         Ambulatory referral to Ophthalmology      I have ordered the following medication/changed the following medications:   Stop the following medications: There are no discontinued medications.   Start the following medications: No orders of the defined types were placed in this encounter.    Follow up:  2-4 weeks    Should you have any questions or concerns please call the internal medicine clinic at [phone].    [name], D.O. [place]

## 2022-09-10 NOTE — Progress Notes (Signed)
Subjective:   [name] is a 77 y.o. male who presents for an Initial Medicare Annual Wellness Visit. I connected with  [name] on 09/10/22 by a  Face-To-Face encounter  and verified that I am speaking with the correct person using two identifiers.  Patient Location: Other:  Office/Clinic  Provider Location: Office/Clinic  I discussed the limitations of evaluation and management by telemedicine. The patient expressed understanding and agreed to proceed.  Review of Systems    Defer to PCP       Objective:    Today's Vitals   09/10/22 1432  BP: 112/64  Pulse: 63  Temp: 97.8 F (36.6 C)  TempSrc: Oral  SpO2: 98%  Weight: 121 lb 4.8 oz (55 kg)  Height: 6' (1.829 m)  PainSc: 0-No pain   Body mass index is 16.45 kg/m.     09/10/2022    9:55 AM 08/16/2022    6:28 AM 08/14/2022    9:25 AM 07/08/2022   10:15 AM 05/31/2022    8:07 AM 05/20/2022   11:18 AM 02/11/2022   11:40 AM  Advanced Directives  Does Patient Have a Medical Advance Directive? No No No No No No No  Would patient like information on creating a medical advance directive? No - Patient declined No - Patient declined No - Patient declined  No - Patient declined No - Patient declined No - Patient declined    Current Medications (verified) Outpatient Encounter Medications as of 09/10/2022  Medication Sig   albuterol (PROAIR HFA) 108 (90 Base) MCG/ACT inhaler Inhale 1-2 puffs into the lungs every 6 (six) hours as needed for wheezing or shortness of breath.   aspirin EC 81 MG tablet Take 1 tablet (81 mg total) by mouth daily at 6 (six) AM. Swallow whole.   clopidogrel (PLAVIX) 75 MG tablet Take 1 tablet (75 mg total) by mouth daily.   escitalopram (LEXAPRO) 5 MG tablet TAKE 1 TABLET (5 MG TOTAL) BY MOUTH DAILY.   HYDROcodone-acetaminophen (NORCO) 7.5-325 MG tablet Take 1 tablet by mouth 2 (two) times daily as needed for moderate pain.   mirtazapine (REMERON) 7.5 MG tablet Take 2 tablets (15 mg total) by  mouth at bedtime.   Pancrelipase, Lip-Prot-Amyl, (ZENPEP) 20000-63000 units CPEP Take 3 capsules by mouth 3 (three) times daily before meals.   pantoprazole (PROTONIX) 40 MG tablet TAKE 1 TABLET EVERY DAY FOR REFLUX   rosuvastatin (CRESTOR) 40 MG tablet Take 1 tablet (40 mg total) by mouth daily.   senna (SENOKOT) 8.6 MG TABS tablet Take 2 tablets (17.2 mg total) by mouth in the morning and at bedtime. (Patient taking differently: Take 1 tablet by mouth in the morning and at bedtime.)   No facility-administered encounter medications on file as of 09/10/2022.    Allergies (verified) Candesartan cilexetil-hctz, Hydrochlorothiazide, Shellfish allergy, and Betadine [povidone iodine]   History: Past Medical History:  Diagnosis Date   AAA (abdominal aortic aneurysm) ([place])    followed by vasc surgery   Alcohol abuse    Barretts esophagus    bx distal esophagus 2011 neg    Bladder outlet obstruction 04/02/2018   CAD in native artery    a. s/p stent to prox RCA and mid Cx.   Cataract    Cholelithiasis    Chronic osteomyelitis involving ankle and foot, right ([place]) 07/13/2018   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   2006 - 3 adenomas largest 12 mm  2008 - no polyps 2013-  2 mm cecal  polyp, adenoma, routine repeat colonoscopy 02/2017 approx 05/14/2017 18 polyps max 12 mm ALL adenomas recall 2019 [name], MD, [facility]      Dieulafoy lesion of duodenum    Diverticulosis of colon    DJD (degenerative joint disease)    left hip   Full dentures    upper and lower   GERD (gastroesophageal reflux disease)    diet controlled    Gout    feet   Hiatal hernia    2 cm noted on upper endos 2011    Hx of adenomatous colonic polyps    Hyperlipidemia    Hypertension    Internal hemorrhoid    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 pt was on Plavix x 3 years then transitioned to ASA, no  problems since   Non-small cell carcinoma of right lung, stage 1 ([place]) 01/30/2016   Renal failure, acute ([facility])    hx of 2/2 medication, resolved   Tobacco abuse    smoker .5 ppd x 55 yrs   UTI (lower urinary tract infection)    Past Surgical History:  Procedure Laterality Date   ABDOMINAL AORTIC ENDOVASCULAR FENESTRATED STENT GRAFT N/A 08/16/2022   Procedure: ABDOMINAL AORTIC ENDOVASCULAR FENESTRATED STENT GRAFT;  Surgeon: Serafina Mitchell, MD;  Location: [place];  Service: Vascular;  Laterality: N/A;   BIOPSY  11/12/2020   Procedure: BIOPSY;  Surgeon: Doran Stabler, MD;  Location: [place];  Service: Gastroenterology;;   BIOPSY  07/25/2021   Procedure: BIOPSY;  Surgeon: Daryel November, MD;  Location: [place];  Service: Gastroenterology;;   BIOPSY  12/24/2021   Procedure: BIOPSY;  Surgeon: Irving Copas., MD;  Location: [facility];  Service: Gastroenterology;;   BRONCHIAL BIOPSY  07/27/2021   Procedure: BRONCHIAL BIOPSIES;  Surgeon: Collene Gobble, MD;  Location: [place];  Service: Cardiopulmonary;;   BRONCHIAL BRUSHINGS  07/27/2021   Procedure: BRONCHIAL BRUSHINGS;  Surgeon: Collene Gobble, MD;  Location: [facility]COPY;  Service: Cardiopulmonary;;   cataract surgery Bilateral 2018   removal of cataracts   COLONOSCOPY     hx polyps - [name] 02/2012   COLONOSCOPY WITH PROPOFOL N/A 11/12/2020   Procedure: COLONOSCOPY WITH PROPOFOL;  Surgeon: Doran Stabler, MD;  Location: [place];  Service: Gastroenterology;  Laterality: N/A;   CORONARY ANGIOPLASTY WITH STENT PLACEMENT     ENDOVASCULAR STENT INSERTION Bilateral 08/16/2022   Procedure: ENDOVASCULAR STENT GRAFT INSERTION OF BILATERAL RENAL ARTERIES;  Surgeon: Serafina Mitchell, MD;  Location: [facility];  Service: Vascular;  Laterality: Bilateral;   ESOPHAGOGASTRODUODENOSCOPY (EGD) WITH PROPOFOL N/A 11/12/2020   Procedure: ESOPHAGOGASTRODUODENOSCOPY (EGD) WITH PROPOFOL;  Surgeon: Doran Stabler, MD;  Location:  [place];  Service: Gastroenterology;  Laterality: N/A;   ESOPHAGOGASTRODUODENOSCOPY (EGD) WITH PROPOFOL N/A 03/06/2021   Procedure: ESOPHAGOGASTRODUODENOSCOPY (EGD) WITH PROPOFOL;  Surgeon: Yetta Flock, MD;  [facility];  Service: Gastroenterology;  Laterality: N/A;   ESOPHAGOGASTRODUODENOSCOPY (EGD) WITH PROPOFOL N/A 07/25/2021   Procedure: ESOPHAGOGASTRODUODENOSCOPY (EGD) WITH PROPOFOL;  Surgeon: Daryel November, MD;  Location: [place];  Service: Gastroenterology;  Laterality: N/A;   ESOPHAGOGASTRODUODENOSCOPY (EGD) WITH PROPOFOL N/A 12/24/2021   Procedure: ESOPHAGOGASTRODUODENOSCOPY (EGD) WITH PROPOFOL;  Surgeon: Rush Landmark Telford Nab., MD;  [facility];  Service: Gastroenterology;  Laterality: N/A;   flexible cystourethroscopy  05/24/06   HEMOSTASIS CLIP PLACEMENT  03/06/2021   Procedure: HEMOSTASIS CLIP PLACEMENT;  Surgeon: Yetta Flock, MD;  Location: [facility];  Service: Gastroenterology;;   HEMOSTASIS CLIP PLACEMENT  12/24/2021   Procedure: HEMOSTASIS CLIP PLACEMENT;  Surgeon: Irving Copas., MD;  Location: [facility];  Service: Gastroenterology;;   HOT HEMOSTASIS N/A 03/06/2021   Procedure: HOT HEMOSTASIS (ARGON PLASMA COAGULATION/BICAP);  Surgeon: Yetta Flock, MD;  Location: [facility];  Service: Gastroenterology;  Laterality: N/A;   INGUINAL HERNIA REPAIR  2005   INSERTION OF ILIAC STENT Bilateral 08/16/2022   Procedure: INSERTION OF ILIAC STENT RIGHT EXTERNAL ARTERY, LEFT COMMON ARTERY, AND RIGHT COMMON ARTERY;  Surgeon: Serafina Mitchell, MD;  Location: [place];  Service: Vascular;  Laterality: Bilateral;   IR CATHETER TUBE CHANGE  07/08/2022   LUNG LOBECTOMY Right 11/07   RUL   MULTIPLE TOOTH EXTRACTIONS     POLYPECTOMY  11/12/2020   Procedure: POLYPECTOMY;  Surgeon: Doran Stabler, MD;  Location: [place];  Service: Gastroenterology;;   POLYPECTOMY  12/24/2021   Procedure: POLYPECTOMY;  Surgeon: Irving Copas., MD;  Location: [facility];  Service: Gastroenterology;;   SUBMUCOSAL LIFTING INJECTION  12/24/2021   Procedure: SUBMUCOSAL LIFTING INJECTION;  Surgeon: Irving Copas., MD;  Location: [facility];  Service: Gastroenterology;;   TRANSURETHRAL RESECTION OF PROSTATE N/A 05/02/2021   Procedure: TRANSURETHRAL RESECTION OF THE PROSTATE (TURP);  Surgeon: Lucas Mallow, MD;  [facility];  Service: Urology;  Laterality: N/A;   ULTRASOUND GUIDANCE FOR VASCULAR ACCESS Bilateral 08/16/2022   Procedure: ULTRASOUND GUIDANCE FOR VASCULAR ACCESS;  Surgeon: Serafina Mitchell, MD;  [facility];  Service: Vascular;  Laterality: Bilateral;   UPPER ESOPHAGEAL ENDOSCOPIC ULTRASOUND (EUS) N/A 12/24/2021   Procedure: UPPER ESOPHAGEAL ENDOSCOPIC ULTRASOUND (EUS);  Surgeon: Irving Copas., MD;  Location: [facility];  Service: Gastroenterology;  Laterality: N/A;   UPPER GASTROINTESTINAL ENDOSCOPY     VIDEO BRONCHOSCOPY  07/27/2021   Procedure: VIDEO BRONCHOSCOPY WITHOUT FLUORO;  Surgeon: Collene Gobble, MD;  Location: [facility];  Service: Cardiopulmonary;;   Family History  Problem Relation Age of Onset   Diabetes Mother    Hypertension Sister    Heart disease Sister    Diabetes Sister    Arthritis Brother    Gout Brother    Colon cancer Neg Hx    Esophageal cancer Neg Hx    Rectal cancer Neg Hx    Stomach cancer Neg Hx    Colon polyps Neg Hx    Inflammatory bowel disease Neg Hx    Liver disease Neg Hx    Pancreatic cancer Neg Hx    Social History   Socioeconomic History   Marital status: Single    Spouse name: Not on file   Number of children: 0   Years of education: Not on file   Highest education level: Not on file  Occupational History   Not on file  Tobacco Use   Smoking status: Every Day    Packs/day: 0.75    Years: 55.00    Additional pack years: 0.00    Total pack years: 41.25    Types: Cigarettes   Smokeless tobacco: Never   Tobacco  comments:    Smokes 8-9 cigarettes per day  Vaping Use   Vaping Use: Never used  Substance and Sexual Activity   Alcohol use: Not Currently   Drug use: No   Sexual activity: Not on file  Other Topics Concern   Not on file  Social History Narrative   Patient is single reports no children   Smoker, no current alcohol or drug use   Social Determinants of Health   Financial Resource Strain: Low Risk  (09/10/2022)   Overall Financial Resource Strain (CARDIA)    Difficulty of Paying Living Expenses: Not hard at all  Recent Concern: Financial Resource Strain - Medium Risk (08/19/2022)   Overall Financial Resource Strain (CARDIA)    Difficulty of Paying Living Expenses: Somewhat hard  Food Insecurity: No Food Insecurity (09/10/2022)   Hunger Vital Sign    Worried About Running Out of Food in the Last Year: Never true    Ran Out of Food in the Last Year: Never true  Transportation Needs: No Transportation Needs (09/10/2022)   PRAPARE - Hydrologist (Medical): No    Lack of Transportation (Non-Medical): No  Physical Activity: Inactive (09/10/2022)   Exercise Vital Sign    Days of Exercise per Week: 0 days    Minutes of Exercise per Session: 0 min  Stress: No Stress Concern Present (09/10/2022)   [place]    Feeling of Stress : Only a little  Social Connections: Moderately Isolated (09/10/2022)   Social Connection and Isolation Panel [NHANES]    Frequency of Communication with Friends and Family: More than three times a week    Frequency of Social Gatherings with Friends and Family: Three times a week    Attends Religious Services: More than 4 times per year    Active Member of Clubs or Organizations: No    Attends Archivist Meetings: Never    Marital Status: Divorced    Tobacco Counseling Ready to quit: Not Answered Counseling given: Not Answered Tobacco comments: Smokes 8-9  cigarettes per day   Clinical Intake:  Pre-visit preparation completed: Yes  Pain : No/denies pain Pain Score: 0-No pain     Nutritional Risks: Unintentional weight loss Diabetes: No  How often do you need to have someone help you when you read instructions, pamphlets, or other written materials from your doctor or pharmacy?: 1 - Never What is the last grade level you completed in school?: 10th grade  Diabetic?No  Interpreter Needed?: No  Information entered by :: Saharah Sherrow,cma   Activities of Daily Living    09/10/2022    2:44 PM 09/10/2022    9:55 AM  In your present state of health, do you have any difficulty performing the following activities:  Hearing? 1 1  Vision? 0 0  Difficulty concentrating or making decisions? 1 1  Walking or climbing stairs? 1 1  Dressing or bathing? 1 1  Doing errands, shopping? 1 1    Patient Care Team: Axel Filler, MD as PCP - General (Internal Medicine) Freada Bergeron, MD as PCP - Cardiology (Cardiology) Curt Bears, MD (Hematology and Oncology) Rodolph Bong, MD (Inactive) (Cardiology)  Indicate any recent Medical Services you may have received from other than [facility] providers in the past year (date may be approximate).     Assessment:   This is a routine wellness examination for [place].  Hearing/Vision screen No results found.  Dietary issues and exercise activities discussed:     Goals Addressed   None   Depression Screen    09/10/2022    2:44 PM 09/10/2022   11:05 AM 05/20/2022   11:18 AM 11/26/2021    2:10 PM 07/11/2021    5:02 PM 05/07/2021  11:21 AM 03/05/2021    1:09 PM  PHQ 2/9 Scores  PHQ - 2 Score 6 6 0 0 6 0 0  PHQ- 9 Score 23 23   20  0     Fall Risk    09/10/2022    2:44 PM 05/20/2022    9:32 AM 02/11/2022   11:40 AM 11/26/2021    2:09 PM 05/07/2021    9:50 AM  Fall Risk   Falls in the past year? 0 0 0 1 0  Number falls in past yr: 0 0 0 0 0  Injury with Fall? 0 0 0 0 0   Risk for fall due to : No Fall Risks   Impaired balance/gait;History of fall(s);Other (Comment)   Risk for fall due to: Comment    short of breath   Follow up Falls evaluation completed;Falls prevention discussed Falls evaluation completed Falls evaluation completed Falls evaluation completed Falls evaluation completed    FALL RISK PREVENTION PERTAINING TO THE HOME:  Any stairs in or around the home? Yes  If so, are there any without handrails? No  Home free of loose throw rugs in walkways, pet beds, electrical cords, etc? No  Adequate lighting in your home to reduce risk of falls? Yes   ASSISTIVE DEVICES UTILIZED TO PREVENT FALLS:  Life alert? No  Use of a cane, walker or w/c? Yes  Grab bars in the bathroom? No  Shower chair or bench in shower? No  Elevated toilet seat or a handicapped toilet? Yes   TIMED UP AND GO:  Was the test performed? No .  Length of time to ambulate 10 feet: 0 sec.   Gait slow and steady with assistive device  Cognitive Function:        09/10/2022    2:44 PM  6CIT Screen  What Year? 0 points  What month? 0 points  What time? 0 points  Count back from 20 0 points  Months in reverse 0 points  Repeat phrase 0 points  Total Score 0 points    Immunizations Immunization History  Administered Date(s) Administered   Fluad Quad(high Dose 65+) 05/15/2020, 03/05/2021, 03/05/2022   Influenza Split 03/14/2011, 06/30/2012   Influenza Whole 04/20/2009, 03/09/2010   Influenza,inj,Quad PF,6+ Mos 02/18/2013, 05/10/2014, 05/01/2015, 02/20/2016, 03/03/2017, 03/02/2018, 05/17/2019   PFIZER(Purple Top)SARS-COV-2 Vaccination 08/27/2019, 09/20/2019   Pneumococcal Conjugate-13 08/16/2014   Pneumococcal Polysaccharide-23 03/14/2011   Td 03/13/2009    TDAP status: Due, Education has been provided regarding the importance of this vaccine. Advised may receive this vaccine at local pharmacy or Health Dept. Aware to provide a copy of the vaccination record if  obtained from local pharmacy or Health Dept. Verbalized acceptance and understanding.  Flu Vaccine status: Up to date  Pneumococcal vaccine status: Up to date  Covid-19 vaccine status: Completed vaccines  Qualifies for Shingles Vaccine? No   Zostavax completed No   Shingrix Completed?: No.    Education has been provided regarding the importance of this vaccine. Patient has been advised to call insurance company to determine out of pocket expense if they have not yet received this vaccine. Advised may also receive vaccine at local pharmacy or Health Dept. Verbalized acceptance and understanding.  Screening Tests Health Maintenance  Topic Date Due   Zoster Vaccines- Shingrix (1 of 2) Never done   DTaP/Tdap/Td (2 - Tdap) 03/14/2019   COVID-19 Vaccine (3 - Pfizer risk series) 10/18/2019   COLONOSCOPY (Pts 45-60yrs Insurance coverage will need to be confirmed)  11/13/2022  Lung Cancer Screening  02/01/2023   Medicare Annual Wellness (AWV)  09/10/2023   Pneumonia Vaccine 66+ Years old  Completed   INFLUENZA VACCINE  Completed   Hepatitis C Screening  Completed   HPV VACCINES  Aged Out    Health Maintenance  Health Maintenance Due  Topic Date Due   Zoster Vaccines- Shingrix (1 of 2) Never done   DTaP/Tdap/Td (2 - Tdap) 03/14/2019   COVID-19 Vaccine (3 - Pfizer risk series) 10/18/2019    Colorectal cancer screening: Type of screening: Colonoscopy. Completed 11/12/2020. Repeat every 2 years  Lung Cancer Screening: (Low Dose CT Chest recommended if Age 28-80 years, 30 pack-year currently smoking OR have quit w/in 15years.) does not qualify.   Lung Cancer Screening Referral: N/A  Additional Screening:  Hepatitis C Screening: does not qualify; Completed 11/06/2020  Vision Screening: Recommended annual ophthalmology exams for early detection of glaucoma and other disorders of the eye. Is the patient up to date with their annual eye exam?  No  Who is the provider or what is the  name of the office in which the patient attends annual eye exams? Dr.Groat If pt is not established with a provider, would they like to be referred to a provider to establish care? No .   Dental Screening: Recommended annual dental exams for proper oral hygiene  Community Resource Referral / Chronic Care Management: CRR required this visit?  No   CCM required this visit?  No      Plan:     I have personally reviewed and noted the following in the patient's chart:   Medical and social history Use of alcohol, tobacco or illicit drugs  Current medications and supplements including opioid prescriptions. Patient is currently taking opioid prescriptions. Information provided to patient regarding non-opioid alternatives. Patient advised to discuss non-opioid treatment plan with their provider. Functional ability and status Nutritional status Physical activity Advanced directives List of other physicians Hospitalizations, surgeries, and ER visits in previous 12 months Vitals Screenings to include cognitive, depression, and falls Referrals and appointments  In addition, I have reviewed and discussed with patient certain preventive protocols, quality metrics, and best practice recommendations. A written personalized care plan for preventive services as well as general preventive health recommendations were provided to patient.     Kerin Perna, [place]   09/10/2022   Nurse Notes: Face-To-Face Visit  Mr. Crase , Thank you for taking time to come for your Medicare Wellness Visit. I appreciate your ongoing commitment to your health goals. Please review the following plan we discussed and let me know if I can assist you in the future.   These are the goals we discussed:  Goals      Blood Pressure < 140/90        This is a list of the screening recommended for you and due dates:  Health Maintenance  Topic Date Due   Zoster (Shingles) Vaccine (1 of 2) Never done   DTaP/Tdap/Td vaccine  (2 - Tdap) 03/14/2019   COVID-19 Vaccine (3 - Pfizer risk series) 10/18/2019   Colon Cancer Screening  11/13/2022   Screening for Lung Cancer  02/01/2023   Medicare Annual Wellness Visit  09/10/2023   Pneumonia Vaccine  Completed   Flu Shot  Completed   Hepatitis C Screening: USPSTF Recommendation to screen - Ages 18-79 yo.  Completed   HPV Vaccine  Aged Out

## 2022-09-10 NOTE — Progress Notes (Signed)
CC: weight loss  HPI:  JavierJavier [name] is a 77 y.o. male living with a history stated below and presents today for weight loss. Please see problem based assessment and plan for additional details.  Past Medical History:  Diagnosis Date   AAA (abdominal aortic aneurysm) ([place])    followed by vasc surgery   Alcohol abuse    Barretts esophagus    bx distal esophagus 2011 neg    Bladder outlet obstruction 04/02/2018   CAD in native artery    a. s/p stent to prox RCA and mid Cx.   Cataract    Cholelithiasis    Chronic osteomyelitis involving ankle and foot, right ([place]) 07/13/2018   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   2006 - 3 adenomas largest 12 mm  2008 - no polyps 2013-  2 mm cecal polyp, adenoma, routine repeat colonoscopy 02/2017 approx 05/14/2017 18 polyps max 12 mm ALL adenomas recall 2019 [name], MD, [facility]      Dieulafoy lesion of duodenum    Diverticulosis of colon    DJD (degenerative joint disease)    left hip   Full dentures    upper and lower   GERD (gastroesophageal reflux disease)    diet controlled    Gout    feet   Hiatal hernia    2 cm noted on upper endos 2011    Hx of adenomatous colonic polyps    Hyperlipidemia    Hypertension    Internal hemorrhoid    Lung cancer ([place]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 pt was on Plavix x 3 years then transitioned to ASA, no problems since   Non-small cell carcinoma of right lung, stage 1 ([place]) 01/30/2016   Renal failure, acute (HCC)    hx of 2/2 medication, resolved   Tobacco abuse    smoker .5 ppd x 55 yrs   UTI (lower urinary tract infection)     Current Outpatient Medications on File Prior to Visit  Medication Sig Dispense Refill   albuterol (PROAIR HFA) 108 (90 Base) MCG/ACT inhaler Inhale 1-2 puffs into the lungs every 6 (six) hours as needed for wheezing or shortness of breath. 1 each 2   aspirin EC  81 MG tablet Take 1 tablet (81 mg total) by mouth daily at 6 (six) AM. Swallow whole. 90 tablet 3   clopidogrel (PLAVIX) 75 MG tablet Take 1 tablet (75 mg total) by mouth daily. 30 tablet 3   escitalopram (LEXAPRO) 5 MG tablet TAKE 1 TABLET (5 MG TOTAL) BY MOUTH DAILY. 90 tablet 1   HYDROcodone-acetaminophen (NORCO) 7.5-325 MG tablet Take 1 tablet by mouth 2 (two) times daily as needed for moderate pain. 60 tablet 0   mirtazapine (REMERON) 7.5 MG tablet Take 2 tablets (15 mg total) by mouth at bedtime. 90 tablet 1   Pancrelipase, Lip-Prot-Amyl, (ZENPEP) 20000-63000 units CPEP Take 3 capsules by mouth 3 (three) times daily before meals. 270 capsule 3   pantoprazole (PROTONIX) 40 MG tablet TAKE 1 TABLET EVERY DAY FOR REFLUX 90 tablet 3   rosuvastatin (CRESTOR) 40 MG tablet Take 1 tablet (40 mg total) by mouth daily. 90 tablet 3   senna (SENOKOT) 8.6 MG TABS tablet Take 2 tablets (17.2 mg total) by mouth in the morning and at bedtime. (Patient taking differently: Take 1 tablet by mouth in the morning and at bedtime.)  120 tablet 1   No current facility-administered medications on file prior to visit.    Review of Systems: ROS negative except for what is noted on the assessment and plan.  Vitals:   09/10/22 0950  BP: 112/64  Pulse: 63  Temp: 97.8 F (36.6 C)  TempSrc: Oral  SpO2: 98%  Weight: 121 lb 4.8 oz (55 kg)  Height: 6' (1.829 m)    Physical Exam: Constitutional: chronically ill appearing HENT: normocephalic atraumatic, mucous membranes dry. Temporal wasting present Eyes: conjunctiva non-erythematous Neck: supple Cardiovascular: regular rate and rhythm, no m/r/g Pulmonary/Chest: normal work of breathing on room air, lungs clear to auscultation bilaterally Abdominal: soft, non-tender, non-distended MSK: normal bulk and tone Neurological: alert & oriented x 3 Skin: warm and dry Psych: flat affect  Assessment & Plan:   Protein-calorie malnutrition, severe [name] presents  today for persistent weight loss. He has a history of severe protein calorie malnutrition with unknown etiology. He has been treated for pancreatic insufficiency with some improvement initially. Recently his PCP increased his mirtazepine from 7.5 mg to 15 mg daily which he has not seen improvement with. He endorses adherence to both his mirtazepine and pancreatic enzyme replacement. His sister and family bring him food, access to meals does not seem to be contributing. He denies dysphagia or difficulty swallowing, do not suspect structural reason for his poor appetite.  On today's visit he is down 20 lbs in the last 2-3 weeks. It seems to be mostly a lack of interest in food and/or a low appetite drive. He lives alone and despite company of his family still has no interest in eating. He felt he was doing well until 2 weeks ago, he isn't certain what has changed.   When asked about his diet he notes he will put food in his mouth and eventually spit it out. He noticed drenching night sweats over the last week and occasional loose stools. Denies nausea, vomiting, chest or abdominal pain.He believes a short term rehabilitation facility where he could regain his strength would be beneficial, he recalls his last hospitalization where he went to a SNF and improvement in his strength and appetite.   PHQ of 23 today. Last recorded December 4th of 0. He avoided eye contact throughout the visit and had a significantly flat affect. He is chronically ill appearing and cachetic with temporal muscle wasting.   While he has many risk factors for malignancy including an ampullary adenoma, prior pulmonary nodule that seems to have resolved on recent CT imaging, and prostate cancer, he is clearly depressed and anhedonic. His sister mentions every time she comes to the house he is lying in bed. He denies doing anything throughout the day. I enquired about him moving in with family to help support him, he denies wanting to do  this. His son in [place] has been asking the patient to move in with him, but [name] does not want to move to [place]. He is only interested in a short term care facility. With him doing well at his SNF and declining after being home, believe him living alone is contributing.   Will check CBC, BMP, phosphate and magnesium today and repeat CT chest with contrast to assess for any new nodules with his extensive tobacco use history (prior nodule was enlarging and then he had resolution on scan without contrast). Will need to continue follow up with Dr. 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 He reports no deficiencies in ADL today and  I do not believe he qualifies for SNF. He is able to do these without difficulties.   Do not believe additional antidepressant would be helpful and would worry about interactions between current regimen. Can consider titration off of escitalopram and starting SNRI for anhedonia benefit. He has follow up with his PCP in 2 weeks and can discuss then. Can also consider brain imaging to assess for structural abnormalities that may be causing his symptoms.    Patient discussed with Dr. Fanny Bien, D.O. [facility], PGY-3 Phone: [phone] Date 09/10/2022 Time 4:49 PM

## 2022-09-11 NOTE — Addendum Note (Signed)
Addended by: Riesa Pope on: 09/11/2022 04:04 PM   Modules accepted: Level of Service

## 2022-09-11 NOTE — Progress Notes (Signed)
Agree with CMA Ellison's note and evaluation

## 2022-09-12 ENCOUNTER — Other Ambulatory Visit (HOSPITAL_COMMUNITY): Payer: Medicare Other

## 2022-09-12 ENCOUNTER — Ambulatory Visit: Payer: Medicare Other | Admitting: Vascular Surgery

## 2022-09-18 NOTE — Progress Notes (Signed)
Internal Medicine Clinic Attending  Case and documentation of Dr. Johnney Ou  soon after the resident saw the patient reviewed.  I reviewed the AWV findings.  I agree with the assessment, diagnosis, and plan of care documented in the AWV note.

## 2022-09-18 NOTE — Progress Notes (Signed)
Internal Medicine Clinic Attending  Case discussed with Dr. Katsadouros at the time of the visit.  We reviewed the resident's history and exam and pertinent patient test results.  I agree with the assessment, diagnosis, and plan of care documented in the resident's note.  

## 2022-09-23 ENCOUNTER — Ambulatory Visit: Payer: 59 | Admitting: Surgery

## 2022-09-23 ENCOUNTER — Encounter: Payer: Self-pay | Admitting: Student in an Organized Health Care Education/Training Program

## 2022-09-23 ENCOUNTER — Ambulatory Visit (INDEPENDENT_AMBULATORY_CARE_PROVIDER_SITE_OTHER): Payer: 59 | Admitting: Student in an Organized Health Care Education/Training Program

## 2022-09-23 VITALS — BP 103/48 | HR 51 | Temp 97.5°F | Ht 72.0 in | Wt 118.6 lb

## 2022-09-23 DIAGNOSIS — E861 Hypovolemia: Secondary | ICD-10-CM

## 2022-09-23 DIAGNOSIS — E43 Unspecified severe protein-calorie malnutrition: Secondary | ICD-10-CM

## 2022-09-23 DIAGNOSIS — I10 Essential (primary) hypertension: Secondary | ICD-10-CM

## 2022-09-23 DIAGNOSIS — I959 Hypotension, unspecified: Secondary | ICD-10-CM | POA: Insufficient documentation

## 2022-09-23 LAB — POCT GLYCOSYLATED HEMOGLOBIN (HGB A1C): Hemoglobin A1C: 5.9 % — AB (ref 4.0–5.6)

## 2022-09-23 NOTE — Assessment & Plan Note (Deleted)
Pt BP 103/48 today down from 112/64 at last visit. Suspect symptoms are due to lack of fluid intake. Pt denies any associate dizziness, syncopal/presyncopal events or palpitations. Reports he has still been taking his amlodipine. Will discontinue this today.  > discontinue amlodipine

## 2022-09-23 NOTE — Progress Notes (Signed)
Subjective:   Patient ID: [name] male   DOB: 28-May-1946 77 y.o.   MRN: [id number]  HPI: [name] is a 77 y.o. male with a PMHx as presented below who presents for follow up. Please see problem based assessment and plan for further work up and management.   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   Adenocarcinoma of prostate 05/02/2021   Atrial fibrillation 03/05/2021   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   Protein-calorie malnutrition, severe 11/07/2020   Urinary retention 04/02/2018   Mild alcohol use disorder 12/09/2016   Premature atrial contractions 07/31/2015   Chronic use of opiate for therapeutic purpose 05/01/2015   Atherosclerosis of aorta (HCC) 01/13/2015   Prediabetes 12/30/2014   AAA (abdominal aortic aneurysm)    Healthcare maintenance 06/30/2012   CAD (coronary artery disease) 03/17/2012   Erectile dysfunction 10/12/2010   Arthropathy of pelvic region and thigh 05/02/2008   CKD (chronic kidney disease) stage 2, GFR 60-89 ml/min 08/07/2007   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   Gout 04/18/2006   Tobacco use disorder 04/18/2006   Essential hypertension 04/18/2006   GERD 04/18/2006     Current Outpatient Medications  Medication Sig Dispense Refill   albuterol (PROAIR HFA) 108 (90 Base) MCG/ACT inhaler Inhale 1-2 puffs into the lungs every 6 (six) hours as needed for wheezing or shortness of breath. 1 each 2   aspirin EC 81 MG tablet Take 1 tablet (81 mg total) by mouth daily at 6 (six) AM. Swallow whole. 90 tablet 3   clopidogrel (PLAVIX) 75 MG tablet Take 1 tablet (75 mg total) by mouth daily. 30 tablet 3   escitalopram (LEXAPRO) 5 MG tablet TAKE 1 TABLET (5 MG TOTAL) BY MOUTH DAILY. 90 tablet 1   HYDROcodone-acetaminophen (NORCO) 7.5-325 MG tablet Take 1 tablet by mouth 2 (two) times daily as  needed for moderate pain. 60 tablet 0   mirtazapine (REMERON) 7.5 MG tablet Take 2 tablets (15 mg total) by mouth at bedtime. 90 tablet 1   Pancrelipase, Lip-Prot-Amyl, (ZENPEP) 20000-63000 units CPEP Take 3 capsules by mouth 3 (three) times daily before meals. 270 capsule 3   pantoprazole (PROTONIX) 40 MG tablet TAKE 1 TABLET EVERY DAY FOR REFLUX 90 tablet 3   rosuvastatin (CRESTOR) 40 MG tablet Take 1 tablet (40 mg total) by mouth daily. 90 tablet 3   senna (SENOKOT) 8.6 MG TABS tablet Take 2 tablets (17.2 mg total) by mouth in the morning and at bedtime. (Patient taking differently: Take 1 tablet by mouth in the morning and at bedtime.) 120 tablet 1   No current facility-administered medications for this visit.     Review of Systems: Pertinent ROS present in the A&P. Otherwise negative,   Objective:   Physical Exam: Vitals:   09/23/22 0939  BP: (!) 103/48  Pulse: (!) 51  Temp: (!) 97.5 F (36.4 C)  TempSrc: Oral  SpO2: 100%  Weight: 118 lb 9.6 oz (53.8 kg)  Height: 6' (1.829 m)   Constitutional: cachetic appearing HENT: normocephalic atraumatic, mucous membranes dry Eyes: conjunctiva non-erythematous Neck: supple Cardiovascular: regular rate and rhythm, no m/r/g Pulmonary/Chest: normal work of breathing on room air, lungs clear to auscultation bilaterally Abdominal: soft, non-tender, non-distended MSK: decreased muscle mass  Neurological: alert & oriented x 3 Skin: warm and dry Psych: flat affect  Assessment & Plan:   Protein-calorie malnutrition, severe Pt with history of severe protein calorie malnutrition with unknown etiology. Weight is down 23 pounds from last visit on 03/26. Has been taking mirtazapine and pancreatic enzyme replacement daily. States he has also been drinking about 2 full Boosts a day. Reports over the last month, his appetite has continues to decline and he eats only small snacks throughout the day. He has been trying to take in fluids. Denies any  dysphagia or vomiting. States he has 2-3 bowel movement a day. Unclear as to the etiology of pts continued weight loss and declining appetite. Will obtain lab work today. Additionally, he is scheduled for CT chest and abdomen on 04/10 to rule out possible malignancy. Will follow up in 4 weeks.  > CT chest/abdomen on 04/10 > CBC, CMP, HGB, LDH, lipid profile, Magnesium, phosphorous, TSH, vitamin B12, vitamin D > Mirtazapine 15 mg > Zenpep x3 daily > Boost supplements  Essential hypertension Pt BP 103/48 today down from 112/64 at last visit.  Suspect symptoms are due to lack of fluid intake and chronic malnutrition. Pt denies any associate dizziness, syncopal/presyncopal events or palpitations. Was also hypotensive on 03/04 when his amlodipine was discontinued. Reports he has still been taking his as he was unaware he should stop. He is aware he should stop the amlodipine for now.   > discontinue amlodipine

## 2022-09-23 NOTE — Assessment & Plan Note (Signed)
Pt BP 103/48 today down from 112/64 at last visit.  Suspect symptoms are due to lack of fluid intake and chronic malnutrition. Pt denies any associate dizziness, syncopal/presyncopal events or palpitations. Was also hypotensive on 03/04 when his amlodipine was discontinued. Reports he has still been taking his as he was unaware he should stop. He is aware he should stop the amlodipine for now.   > discontinue amlodipine

## 2022-09-23 NOTE — Patient Instructions (Addendum)
Today we discussed the following:  1) Malnutrition Please continuing taking your Zenpep with meals and Boost. We have obtained lab work today. Additionally, you are scheduled to have a CT scan on 04/10 to determine possible causes of your decreased appetite and weight loss.  2) Hypotension Your blood pressure was low today. Please discontinue use of your amlodipine.   Please follow up in 4 weeks.

## 2022-09-23 NOTE — Assessment & Plan Note (Signed)
Pt with history of severe protein calorie malnutrition with unknown etiology. Weight is down 23 pounds from last visit on 03/26. Has been taking mirtazapine and pancreatic enzyme replacement daily. States he has also been drinking about 2 full Boosts a day. Reports over the last month, his appetite has continues to decline and he eats only small snacks throughout the day. He has been trying to take in fluids. Denies any dysphagia or vomiting. States he has 2-3 bowel movement a day. Unclear as to the etiology of pts continued weight loss and declining appetite. Will obtain lab work today. Additionally, he is scheduled for CT chest and abdomen on 04/10 to rule out possible malignancy. Will follow up in 4 weeks.  > CT chest/abdomen on 04/10 > CBC, CMP, HGB, LDH, lipid profile, Magnesium, phosphorous, TSH, vitamin B12, vitamin D > Mirtazapine 15 mg > Zenpep x3 daily > Boost supplements

## 2022-09-23 NOTE — Progress Notes (Signed)
Attestation for Student Documentation:  I personally was present and performed or re-performed the history, physical exam and medical decision-making activities of this service and have verified that the service and findings are accurately documented in the student's note.  Very difficult situation for this 77 year old person here for severe unintended weight loss.  Current weight is 118 pounds with a BMI of 16.  He has had 23 pounds of weight loss over the last 1 month, this was following abdominal aortic endovascular stent grafting to repair an abdominal aortic aneurysm.  Patient is having no pain with eating, so I doubt mesenteric ischemia.  I spoke with his sister for a while, patient seems to have food aversion, loss of appetite, declining functional status, and depression.  He does have chronic pancreatic insufficiency which may be contributing, uses pancreatic enzymes supplementation.  He is using 2 bottles of boost daily which I have encouraged him to increase.  For his depression and food aversion we have treated him with mirtazapine 15 mg daily which has become less effective over the last 1 month.  He denies any use of alcohol.  He continues to smoke tobacco but has no obvious signs of malignancy on recent imaging, symptoms, or exam today.  I spoke frankly with the patient and his sister, I do not think there is an easy solution here.  Patient has chronic multi morbidity, now advanced age and increasing frailty, he seems to be doing very poorly in the month after major vascular surgery.  We are going to check labs today to rule out renal dysfunction, electrolyte abnormality.  He already has imaging ordered for CT chest, abdomen, and pelvis which I think is appropriate.  He also has follow-up with Dr. Myra Gianotti later today which I think is also appropriate to ensure his aortic graft is functioning as intended.  Patient has very little social support, lives by himself.  If a medically reversible cause of  his cachexia is found we could talk about G-tube placement and nutritional supplementation, likely at a skilled nursing facility to improve his nutrition.  Will talk more with the patient and his family later this week after we have more testing completed.  Tyson Alias, MD 09/23/2022, 1:55 PM

## 2022-09-24 ENCOUNTER — Telehealth: Payer: Self-pay | Admitting: Student in an Organized Health Care Education/Training Program

## 2022-09-24 ENCOUNTER — Other Ambulatory Visit: Payer: Self-pay

## 2022-09-24 NOTE — Telephone Encounter (Signed)
I spoke with his sister, [name] by phone regarding lab work. It shows signs of mild dehydration, no other electrolyte abnormality. I don't see a reason for hospitalization at this point. But, patient remains very frail, low functional status, and is at risk for a poor outcome in the coming weeks despite our best efforts. I don't see an acute reason for this food aversion following the AAA surgery one month ago. Patient has a CT abdomen scheduled for tomorrow morning, then follow up with Dr. [name] afterwards. Without abdominal angina I doubt he has mesenteric ischemia. 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 I asked Ms. [name] to double check that he is taking pancreatic enzymes with meals and taking two tabs of mirtazipine for depression and food aversion. I will follow up with them later this week after I see the CT result.

## 2022-09-25 ENCOUNTER — Ambulatory Visit (HOSPITAL_COMMUNITY)
Admission: RE | Admit: 2022-09-25 | Discharge: 2022-09-25 | Disposition: A | Discharge status: Home / Self Care | Payer: 59 | Source: Ambulatory Visit | Attending: Surgery | Admitting: Surgery

## 2022-09-25 ENCOUNTER — Other Ambulatory Visit: Payer: Self-pay | Admitting: Student in an Organized Health Care Education/Training Program

## 2022-09-25 DIAGNOSIS — N281 Cyst of kidney, acquired: Secondary | ICD-10-CM | POA: Diagnosis not present

## 2022-09-25 DIAGNOSIS — I714 Abdominal aortic aneurysm, without rupture, unspecified: Secondary | ICD-10-CM | POA: Diagnosis not present

## 2022-09-25 DIAGNOSIS — K8020 Calculus of gallbladder without cholecystitis without obstruction: Secondary | ICD-10-CM | POA: Diagnosis not present

## 2022-09-25 MED ORDER — ZENPEP 20000-63000 UNITS PO CPEP: 3.0000 | ORAL_CAPSULE | Freq: Three times a day (TID) | ORAL | 3 refills | Status: DC

## 2022-09-25 MED ORDER — IOHEXOL 350 MG/ML SOLN
75.0000 mL | Freq: Once | INTRAVENOUS | Status: AC | PRN
  Administered 2022-09-25: 75 mL via INTRAVENOUS

## 2022-09-26 LAB — MAGNESIUM: Magnesium: 2.4 mg/dL — ABNORMAL HIGH (ref 1.6–2.3)

## 2022-09-26 LAB — CBC WITH DIFFERENTIAL/PLATELET
Basophils Absolute: 0 10*3/uL (ref 0.0–0.2)
Basos: 0 %
EOS (ABSOLUTE): 0.1 10*3/uL (ref 0.0–0.4)
Eos: 1 %
Hematocrit: 36.4 % — ABNORMAL LOW (ref 37.5–51.0)
Hemoglobin: 11.3 g/dL — ABNORMAL LOW (ref 13.0–17.7)
Immature Grans (Abs): 0 10*3/uL (ref 0.0–0.1)
Immature Granulocytes: 0 %
Lymphocytes Absolute: 1.1 10*3/uL (ref 0.7–3.1)
Lymphs: 22 %
MCH: 27.3 pg (ref 26.6–33.0)
MCHC: 31 g/dL — ABNORMAL LOW (ref 31.5–35.7)
MCV: 88 fL (ref 79–97)
Monocytes Absolute: 0.4 10*3/uL (ref 0.1–0.9)
Monocytes: 8 %
Neutrophils Absolute: 3.5 10*3/uL (ref 1.4–7.0)
Neutrophils: 69 %
Platelets: 145 10*3/uL — ABNORMAL LOW (ref 150–450)
RBC: 4.14 x10E6/uL (ref 4.14–5.80)
RDW: 15.3 % (ref 11.6–15.4)
WBC: 5.1 10*3/uL (ref 3.4–10.8)

## 2022-09-26 LAB — LIPID PANEL
Chol/HDL Ratio: 4.5 ratio (ref 0.0–5.0)
Cholesterol, Total: 147 mg/dL (ref 100–199)
HDL: 33 mg/dL — ABNORMAL LOW (ref >39)
LDL Chol Calc (NIH): 97 mg/dL (ref 0–99)
Triglycerides: 86 mg/dL (ref 0–149)
VLDL Cholesterol Cal: 17 mg/dL (ref 5–40)

## 2022-09-26 LAB — CMP14 + ANION GAP
ALT: 19 IU/L (ref 0–44)
AST: 29 IU/L (ref 0–40)
Albumin/Globulin Ratio: 0.9 — ABNORMAL LOW (ref 1.2–2.2)
Albumin: 3.8 g/dL (ref 3.8–4.8)
Alkaline Phosphatase: 94 IU/L (ref 44–121)
Anion Gap: 16 mmol/L (ref 10.0–18.0)
BUN/Creatinine Ratio: 18 (ref 10–24)
BUN: 28 mg/dL — ABNORMAL HIGH (ref 8–27)
Bilirubin Total: 0.4 mg/dL (ref 0.0–1.2)
CO2: 20 mmol/L (ref 20–29)
Calcium: 9.3 mg/dL (ref 8.6–10.2)
Chloride: 102 mmol/L (ref 96–106)
Creatinine, Ser: 1.56 mg/dL — ABNORMAL HIGH (ref 0.76–1.27)
Globulin, Total: 4.1 g/dL (ref 1.5–4.5)
Glucose: 120 mg/dL — ABNORMAL HIGH (ref 70–99)
Potassium: 4.4 mmol/L (ref 3.5–5.2)
Sodium: 138 mmol/L (ref 134–144)
Total Protein: 7.9 g/dL (ref 6.0–8.5)
eGFR: 46 mL/min/{1.73_m2} — ABNORMAL LOW (ref >59)

## 2022-09-26 LAB — LACTATE DEHYDROGENASE: LDH: 244 IU/L — ABNORMAL HIGH (ref 121–224)

## 2022-09-26 LAB — VITAMIN D 25 HYDROXY (VIT D DEFICIENCY, FRACTURES): Vit D, 25-Hydroxy: 14.5 ng/mL — ABNORMAL LOW (ref 30.0–100.0)

## 2022-09-26 LAB — PHOSPHORUS: Phosphorus: 2.9 mg/dL (ref 2.8–4.1)

## 2022-09-26 LAB — VITAMIN B12: Vitamin B-12: 556 pg/mL (ref 232–1245)

## 2022-09-26 LAB — TSH: TSH: 1.64 u[IU]/mL (ref 0.450–4.500)

## 2022-10-07 ENCOUNTER — Encounter: Payer: Self-pay | Admitting: Surgery

## 2022-10-07 ENCOUNTER — Ambulatory Visit (INDEPENDENT_AMBULATORY_CARE_PROVIDER_SITE_OTHER): Payer: 59 | Admitting: Surgery

## 2022-10-07 ENCOUNTER — Other Ambulatory Visit: Payer: Self-pay

## 2022-10-07 VITALS — BP 169/78 | HR 59 | Temp 97.6°F | Resp 16 | Ht 72.0 in | Wt 123.0 lb

## 2022-10-07 DIAGNOSIS — M25552 Pain in left hip: Secondary | ICD-10-CM

## 2022-10-07 DIAGNOSIS — I7142 Juxtarenal abdominal aortic aneurysm, without rupture: Secondary | ICD-10-CM

## 2022-10-07 DIAGNOSIS — Z79891 Long term (current) use of opiate analgesic: Secondary | ICD-10-CM

## 2022-10-07 MED ORDER — MIRTAZAPINE 7.5 MG PO TABS: 15.0000 mg | ORAL_TABLET | Freq: Every day | ORAL | 1 refills | Status: DC

## 2022-10-07 MED ORDER — HYDROCODONE-ACETAMINOPHEN 7.5-325 MG PO TABS: 1.0000 | ORAL_TABLET | Freq: Two times a day (BID) | ORAL | 0 refills | Status: DC | PRN

## 2022-10-07 NOTE — Progress Notes (Signed)
Patient name: [name] MRN: [id number] DOB: [birth year] Sex: male  REASON FOR VISIT:     Post op  HISTORY OF PRESENT ILLNESS:   [name] is a 76 y.o. male who underwent a fenestrated juxtarenal abdominal aortic aneurysm repair with stenting of his left and right renal arteries on 08/16/2022.  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  He has no complaints today.   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  He has a suprapubic catheter in place.  He suffers from protein calorie malnutrition as well as weight loss.  He has a history of coronary artery disease, status post PCI.  CURRENT MEDICATIONS:    Current Outpatient Medications  Medication Sig Dispense Refill   albuterol (PROAIR HFA) 108 (90 Base) MCG/ACT inhaler Inhale 1-2 puffs into the lungs every 6 (six) hours as needed for wheezing or shortness of breath. 1 each 2   aspirin EC 81 MG tablet Take 1 tablet (81 mg total) by mouth daily at 6 (six) AM. Swallow whole. 90 tablet 3   clopidogrel (PLAVIX) 75 MG tablet Take 1 tablet (75 mg total) by mouth daily. 30 tablet 3   escitalopram (LEXAPRO) 5 MG tablet TAKE 1 TABLET (5 MG TOTAL) BY MOUTH DAILY. 90 tablet 1   HYDROcodone-acetaminophen (NORCO) 7.5-325 MG tablet Take 1 tablet by mouth 2 (two) times daily as needed for moderate pain. 60 tablet 0   mirtazapine (REMERON) 7.5 MG tablet Take 2 tablets (15 mg total) by mouth at bedtime. 180 tablet 1   Pancrelipase, Lip-Prot-Amyl, (ZENPEP) 20000-63000 units CPEP Take 3 capsules by mouth 3 (three) times daily before meals. 270 capsule 3   pantoprazole (PROTONIX) 40 MG tablet TAKE 1 TABLET EVERY DAY FOR REFLUX 90 tablet 3   rosuvastatin (CRESTOR) 40 MG tablet Take 1 tablet (40 mg total) by mouth daily. 90 tablet 3   senna (SENOKOT) 8.6 MG TABS tablet Take 2 tablets (17.2 mg total) by mouth in the morning and at  bedtime. (Patient taking differently: Take 1 tablet by mouth in the morning and at bedtime.) 120 tablet 1   No current facility-administered medications for this visit.    REVIEW OF SYSTEMS:    denotes positive finding,  denotes negative finding Cardiac  Comments:  Chest pain or chest pressure:    Shortness of breath upon exertion:    Short of breath when lying flat:    Irregular heart rhythm:    Constitutional    Fever or chills:      PHYSICAL EXAM:   Vitals:   10/07/22 1125  BP: (!) 169/78  Pulse: (!) 59  Resp: 16  Temp: 97.6 F (36.4 C)  TempSrc: Temporal  SpO2: 98%  Weight: 123 lb (55.8 kg)  Height: 6' (1.829 m)    GENERAL: The patient is a well-nourished male, in no acute distress. The vital signs are documented above. CARDIOVASCULAR: There is a regular rate and rhythm. PULMONARY: Non-labored respirations Incisions well-healed Extremities warm and well-perfused  STUDIES:   I have reviewed his CT scan with the following findings: VASCULAR   1. Juxtarenal abdominal aortic aneurysm status post fenestrated aortobi-iliac endograft repair. No definite evidence of endoleak, however the study is limited due to lack of precontrast phase. Slight interval aneurysm sac decompression. 2. 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 3.  Aortic Atherosclerosis (ICD10-I70.0).   NON-VASCULAR   1. No acute abdominopelvic abnormality. 2. Unchanged cholelithiasis. 3. Unchanged prostatomegaly.   MEDICAL ISSUES:   Status post endovascular repair of juxtarenal abdominal aortic aneurysm: Patient CT scan shows that his fenestrated graft is in good position.  The aneurysm is successfully excluded with no evidence of endoleak.  He will return in 6 months for renal artery duplex as well as aortic duplex.  Charlena Cross, MD, FACS [facility] [phone] Pager [phone]

## 2022-10-07 NOTE — Telephone Encounter (Signed)
I spoke with the patient and his sister by phone this morning.  I was very worried about his weight loss from 2 weeks ago, but he says he is doing much better now.  More functional, eating better every day.  Not sure what happened when I last saw him.  Perhaps there was a gap in some of his medications like this in peppermint has not been leading to his poor appetite and weight loss.  Tells me he is doing better now.  Going to follow-up with vascular clinic today.  Will follow-up with my clinic in another month or so as well.

## 2022-10-14 ENCOUNTER — Telehealth: Payer: Self-pay | Admitting: Internal Medicine

## 2022-10-14 NOTE — Telephone Encounter (Signed)
Please contact patient and explain that now that his aneurysm is stented would like to refer him to [facility] to consider having tumor removed from the intestine  Please refer to Dr. Mike Gip at [facility] (had a referral last year but we cancelled while waiting to stent aneurysm)  If [name] wants to talk to me I can call him about iot

## 2022-10-14 NOTE — Telephone Encounter (Signed)
Pt was contacted with Dr. Leone Payor recommendations. Pt requested to be call back later because he was in the middle of doing something right now.

## 2022-10-15 ENCOUNTER — Other Ambulatory Visit: Payer: Self-pay | Admitting: Student in an Organized Health Care Education/Training Program

## 2022-10-16 ENCOUNTER — Ambulatory Visit (HOSPITAL_COMMUNITY)
Admission: RE | Admit: 2022-10-16 | Discharge: 2022-10-16 | Disposition: A | Discharge status: Home / Self Care | Payer: 59 | Source: Ambulatory Visit | Attending: Internal Medicine | Admitting: Internal Medicine

## 2022-10-16 ENCOUNTER — Other Ambulatory Visit: Payer: Self-pay

## 2022-10-16 DIAGNOSIS — I714 Abdominal aortic aneurysm, without rupture, unspecified: Secondary | ICD-10-CM

## 2022-10-16 DIAGNOSIS — E43 Unspecified severe protein-calorie malnutrition: Secondary | ICD-10-CM | POA: Diagnosis not present

## 2022-10-16 DIAGNOSIS — I700 Atherosclerosis of aorta: Secondary | ICD-10-CM | POA: Diagnosis not present

## 2022-10-16 DIAGNOSIS — Z87898 Personal history of other specified conditions: Secondary | ICD-10-CM | POA: Diagnosis not present

## 2022-10-16 DIAGNOSIS — J439 Emphysema, unspecified: Secondary | ICD-10-CM | POA: Diagnosis not present

## 2022-10-16 DIAGNOSIS — I2510 Atherosclerotic heart disease of native coronary artery without angina pectoris: Secondary | ICD-10-CM | POA: Diagnosis not present

## 2022-10-16 DIAGNOSIS — I517 Cardiomegaly: Secondary | ICD-10-CM | POA: Insufficient documentation

## 2022-10-16 DIAGNOSIS — I7142 Juxtarenal abdominal aortic aneurysm, without rupture: Secondary | ICD-10-CM

## 2022-10-16 MED ORDER — IOHEXOL 350 MG/ML SOLN
75.0000 mL | Freq: Once | INTRAVENOUS | Status: AC | PRN
  Administered 2022-10-16: 75 mL via INTRAVENOUS

## 2022-10-16 NOTE — Telephone Encounter (Unsigned)
Left message for pt to call back  °

## 2022-10-17 ENCOUNTER — Emergency Department (HOSPITAL_COMMUNITY)
Admission: EM | Admit: 2022-10-17 | Discharge: 2022-10-17 | Disposition: A | Discharge status: Home / Self Care | Payer: 59 | Attending: Emergency Medicine | Admitting: Emergency Medicine

## 2022-10-17 ENCOUNTER — Other Ambulatory Visit: Payer: Self-pay

## 2022-10-17 DIAGNOSIS — N3000 Acute cystitis without hematuria: Secondary | ICD-10-CM | POA: Insufficient documentation

## 2022-10-17 DIAGNOSIS — Z743 Need for continuous supervision: Secondary | ICD-10-CM | POA: Diagnosis not present

## 2022-10-17 DIAGNOSIS — R6889 Other general symptoms and signs: Secondary | ICD-10-CM | POA: Diagnosis not present

## 2022-10-17 DIAGNOSIS — N390 Urinary tract infection, site not specified: Secondary | ICD-10-CM | POA: Diagnosis not present

## 2022-10-17 DIAGNOSIS — R300 Dysuria: Secondary | ICD-10-CM

## 2022-10-17 DIAGNOSIS — Z466 Encounter for fitting and adjustment of urinary device: Secondary | ICD-10-CM | POA: Insufficient documentation

## 2022-10-17 LAB — CBC WITH DIFFERENTIAL/PLATELET
Abs Immature Granulocytes: 0.01 10*3/uL (ref 0.00–0.07)
Basophils Absolute: 0 10*3/uL (ref 0.0–0.1)
Basophils Relative: 0 %
Eosinophils Absolute: 0.1 10*3/uL (ref 0.0–0.5)
Eosinophils Relative: 2 %
HCT: 31.1 % — ABNORMAL LOW (ref 39.0–52.0)
Hemoglobin: 9.7 g/dL — ABNORMAL LOW (ref 13.0–17.0)
Immature Granulocytes: 0 %
Lymphocytes Relative: 29 %
Lymphs Abs: 1.5 10*3/uL (ref 0.7–4.0)
MCH: 28.4 pg (ref 26.0–34.0)
MCHC: 31.2 g/dL (ref 30.0–36.0)
MCV: 91.2 fL (ref 80.0–100.0)
Monocytes Absolute: 0.4 10*3/uL (ref 0.1–1.0)
Monocytes Relative: 7 %
Neutro Abs: 3.3 10*3/uL (ref 1.7–7.7)
Neutrophils Relative %: 62 %
Platelets: 143 10*3/uL — ABNORMAL LOW (ref 150–400)
RBC: 3.41 MIL/uL — ABNORMAL LOW (ref 4.22–5.81)
RDW: 20.4 % — ABNORMAL HIGH (ref 11.5–15.5)
WBC: 5.3 10*3/uL (ref 4.0–10.5)
nRBC: 0 % (ref 0.0–0.2)

## 2022-10-17 LAB — URINALYSIS, ROUTINE W REFLEX MICROSCOPIC
Bilirubin Urine: NEGATIVE
Glucose, UA: NEGATIVE mg/dL
Hgb urine dipstick: NEGATIVE
Ketones, ur: NEGATIVE mg/dL
Nitrite: POSITIVE — AB
Protein, ur: NEGATIVE mg/dL
Specific Gravity, Urine: 1.012 (ref 1.005–1.030)
WBC, UA: >50 WBC/hpf (ref 0–5)
pH: 7 (ref 5.0–8.0)

## 2022-10-17 LAB — BASIC METABOLIC PANEL
Anion gap: 8 (ref 5–15)
BUN: 12 mg/dL (ref 8–23)
CO2: 22 mmol/L (ref 22–32)
Calcium: 8.2 mg/dL — ABNORMAL LOW (ref 8.9–10.3)
Chloride: 111 mmol/L (ref 98–111)
Creatinine, Ser: 1.1 mg/dL (ref 0.61–1.24)
GFR, Estimated: >60 mL/min (ref >60)
Glucose, Bld: 78 mg/dL (ref 70–99)
Potassium: 3.7 mmol/L (ref 3.5–5.1)
Sodium: 141 mmol/L (ref 135–145)

## 2022-10-17 LAB — LACTIC ACID, PLASMA: Lactic Acid, Venous: 0.9 mmol/L (ref 0.5–1.9)

## 2022-10-17 MED ORDER — CEPHALEXIN 500 MG PO CAPS: 500.0000 mg | ORAL_CAPSULE | Freq: Two times a day (BID) | ORAL | 0 refills | Status: DC

## 2022-10-17 MED ORDER — OXYCODONE-ACETAMINOPHEN 5-325 MG PO TABS
1.0000 | ORAL_TABLET | Freq: Once | ORAL | Status: AC
  Administered 2022-10-17: 1 via ORAL
  Filled 2022-10-17: qty 1

## 2022-10-17 MED ORDER — CEPHALEXIN 250 MG PO CAPS
500.0000 mg | ORAL_CAPSULE | Freq: Once | ORAL | Status: AC
  Administered 2022-10-17: 500 mg via ORAL
  Filled 2022-10-17: qty 2

## 2022-10-17 NOTE — Telephone Encounter (Signed)
Spoke with patient regarding MD recommendations. He would like to proceed with the [facility] referral. Referral has been faxed. He's been advised to call back within 2-3 weeks if he has not heard from [facility] regarding an appointment. Provided patient with our office number. Patient verbalized all understanding, no further questions.

## 2022-10-17 NOTE — ED Triage Notes (Signed)
Pt states water coming out of his penis that burns. Has a suprapubic catheter

## 2022-10-17 NOTE — Discharge Instructions (Addendum)
Continue to use your catheter like normal.  Take medications as prescribed.  If symptoms change or worsen you should return.

## 2022-10-17 NOTE — ED Provider Notes (Signed)
[facility] Emergency Department Provider Note MRN:  [id number]  Arrival date & time: 10/17/22     Chief Complaint   Penile Discharge (Pt has a suprapubic catheter and water coming out of penis that burns)   History of Present Illness   [name] is a 77 y.o. year-old male presents to the ED with chief complaint of dysuria.  He has a suprapubic cath and states that he has been having urine leaking from his penis and it burns.  He denies fevers or chills.  He states that the suprapubic catheter has been in place for 2 months and was exchanged about 2 weeks ago.  He reports associated crampy abdominal pain.  History provided by patient.   Review of Systems  Pertinent positive and negative review of systems noted in HPI.    Physical Exam   Vitals:   10/17/22 0304  BP: (!) 162/74  Pulse: 99  Resp: 18  Temp: 97.8 F (36.6 C)  SpO2: 100%    CONSTITUTIONAL:  non toxic-appearing, NAD NEURO:  Alert and oriented x 3, CN 3-12 grossly intact EYES:  eyes equal and reactive ENT/NECK:  Supple, no stridor  CARDIO:  normal rate, regular rhythm, appears well-perfused  PULM:  No respiratory distress,  GI/GU:  non-distended, suprapubic cath, urine leaking from penis MSK/SPINE:  No gross deformities, no edema, moves all extremities  SKIN:  no rash, atraumatic   *Additional and/or pertinent findings included in MDM below  Diagnostic and Interventional Summary    EKG Interpretation  Date/Time:    Ventricular Rate:    PR Interval:    QRS Duration:   QT Interval:    QTC Calculation:   R Axis:     Text Interpretation:         Labs Reviewed  CBC WITH DIFFERENTIAL/PLATELET - Abnormal; Notable for the following components:      Result Value   RBC 3.41 (*)    Hemoglobin 9.7 (*)    HCT 31.1 (*)    RDW 20.4 (*)    Platelets 143 (*)    All other components within normal limits  BASIC METABOLIC PANEL - Abnormal; Notable for the following components:    Calcium 8.2 (*)    All other components within normal limits  URINALYSIS, ROUTINE W REFLEX MICROSCOPIC - Abnormal; Notable for the following components:   APPearance HAZY (*)    Nitrite POSITIVE (*)    Leukocytes,Ua LARGE (*)    Bacteria, UA RARE (*)    All other components within normal limits  URINE CULTURE  LACTIC ACID, PLASMA  LACTIC ACID, PLASMA    No orders to display    Medications  cephALEXin (KEFLEX) capsule 500 mg (500 mg Oral Given 10/17/22 0434)  oxyCODONE-acetaminophen (PERCOCET/ROXICET) 5-325 MG per tablet 1 tablet (1 tablet Oral Given 10/17/22 0433)     Procedures  /  Critical Care SUPRAPUBIC TUBE PLACEMENT  Date/Time: 10/17/2022 4:59 AM  Performed by: Roxy Horseman, PA-C Authorized by: Roxy Horseman, PA-C   Consent:    Consent obtained:  Verbal   Consent given by:  Patient   Risks, benefits, and alternatives were discussed: yes     Risks discussed:  Bleeding, infection and pain   Alternatives discussed:  Delayed treatment and referral Universal protocol:    Procedure explained and questions answered to patient or proxy's satisfaction: yes     Relevant documents present and verified: yes     Test results available: yes  Imaging studies available: yes     Required blood products, implants, devices, and special equipment available: yes     Site/side marked: yes     Immediately prior to procedure, a time out was called: yes     Patient identity confirmed:  Verbally with patient Sedation:    Sedation type:  None Anesthesia:    Anesthesia method:  None Procedure details:    Complexity:  Simple   Catheter type:  Foley   Catheter size:  16 Fr   Ultrasound guidance: no     Number of attempts:  1   Urine characteristics:  Yellow Post-procedure details:    Procedure completion:  Tolerated well, no immediate complications   ED Course and Medical Decision Making  I have reviewed the triage vital signs, the nursing notes, and pertinent available  records from the EMR.  Social Determinants Affecting Complexity of Care: Patient has no clinically significant social determinants affecting this chief complaint..   ED Course:    Medical Decision Making Patient here with lower abdominal pain.  Has indwelling suprapubic catheter.  Dr. Madilyn Hook POCUS notable for full bladder.  Suspect retention and overflow incontinence causing his urine from penis and dysuria.   UA was clean catch per Dr. Madilyn Hook and concerning for infection.    I exchanged the suprapubic catheter and it began draining nicely.  Patient reports significant relief of symptoms.  Amount and/or Complexity of Data Reviewed Labs: ordered.    Details: Normal creatinine Abnormal urinalysis  Risk Prescription drug management.     Consultants: No consultations were needed in caring for this patient.   Treatment and Plan: Emergency department workup does not suggest an emergent condition requiring admission or immediate intervention beyond  what has been performed at this time. The patient is safe for discharge and has  been instructed to return immediately for worsening symptoms, change in  symptoms or any other concerns    Final Clinical Impressions(s) / ED Diagnoses     ICD-10-CM   1. Dysuria  R30.0     2. Urinary catheter change required  Z46.6     3. Acute cystitis without hematuria  N30.00       ED Discharge Orders          Ordered    cephALEXin (KEFLEX) 500 MG capsule  2 times daily        10/17/22 0508              Discharge Instructions Discussed with and Provided to Patient:     Discharge Instructions      Continue to use your catheter like normal.  Take medications as prescribed.  If symptoms change or worsen you should return.       Roxy Horseman, PA-C 10/17/22 0559    Tilden Fossa, MD 10/19/22 [phone]

## 2022-10-18 LAB — URINE CULTURE

## 2022-10-20 LAB — URINE CULTURE: Culture: >=100000 — AB

## 2022-10-21 LAB — URINE CULTURE

## 2022-10-22 LAB — URINE CULTURE

## 2022-10-23 ENCOUNTER — Telehealth (HOSPITAL_BASED_OUTPATIENT_CLINIC_OR_DEPARTMENT_OTHER): Payer: Self-pay | Admitting: *Deleted

## 2022-10-23 NOTE — Telephone Encounter (Signed)
Post ED Visit - Positive Culture Follow-up: Successful Patient Follow-Up  Culture assessed and recommendations reviewed by:  []  Enzo Bi, Pharm.D. []  Celedonio Miyamoto, [address].D., BCPS AQ-ID []  Garvin Fila, Pharm.D., BCPS []  Georgina Pillion, Pharm.D., BCPS []  [place], [address].D., BCPS, AAHIVP []  Estella Husk, Pharm.D., BCPS, AAHIVP []  Lysle Pearl, PharmD, BCPS []  Phillips Climes, PharmD, BCPS []  Agapito Games, PharmD, BCPS []  Verlan Friends, PharmD  Positive urine culture  [x]  Patient discharged with antimicrobial prescription and additional treatment is now indicated []  Organism is resistant to prescribed ED discharge antimicrobial []  Patient with positive blood cultures  Changes discussed with ED provider: Farrel Gobble, PA-C New antibiotic prescription Amoxicillin 500mg  q8hrs x 10 days Called to [facility] [phone]  Contacted patient, date 10/23/2022, time 1400   Lysle Pearl 10/23/2022, 2:09 PM

## 2022-10-23 NOTE — Progress Notes (Signed)
ED Antimicrobial Stewardship Positive Culture Follow Up   [name] is an 77 y.o. male who presented to [facility] on 10/17/2022 with a chief complaint of  Chief Complaint  Patient presents with   Penile Discharge    Pt has a suprapubic catheter and water coming out of penis that burns    Recent Results (from the past 720 hour(s))  Urine Culture     Status: Abnormal   Collection Time: 10/17/22  3:56 AM   Specimen: Urine, Clean Catch  Result Value Ref Range Status   Specimen Description URINE, CLEAN CATCH  Final   Special Requests   Final    NONE Performed at [facility], [address], [place], [place] [address]    Culture (A)  Final    >=100,000 COLONIES/mL PROTEUS MIRABILIS 60,000 COLONIES/mL ENTEROCOCCUS FAECALIS VANCOMYCIN RESISTANT ENTEROCOCCUS ISOLATED    Report Status 10/22/2022 FINAL  Final   Organism ID, Bacteria PROTEUS MIRABILIS (A)  Final   Organism ID, Bacteria ENTEROCOCCUS FAECALIS (A)  Final      Susceptibility   Enterococcus faecalis - MIC*    AMPICILLIN <=2 SENSITIVE Sensitive     NITROFURANTOIN <=16 SENSITIVE Sensitive     VANCOMYCIN >=32 RESISTANT Resistant     * 60,000 COLONIES/mL ENTEROCOCCUS FAECALIS   Proteus mirabilis - MIC*    AMPICILLIN <=2 SENSITIVE Sensitive     CEFAZOLIN <=4 SENSITIVE Sensitive     CEFEPIME <=0.12 SENSITIVE Sensitive     CEFTRIAXONE <=0.25 SENSITIVE Sensitive     CIPROFLOXACIN <=0.25 SENSITIVE Sensitive     GENTAMICIN <=1 SENSITIVE Sensitive     IMIPENEM 4 SENSITIVE Sensitive     NITROFURANTOIN 128 RESISTANT Resistant     TRIMETH/SULFA <=20 SENSITIVE Sensitive     AMPICILLIN/SULBACTAM <=2 SENSITIVE Sensitive     PIP/TAZO <=4 SENSITIVE Sensitive     * >=100,000 COLONIES/mL PROTEUS MIRABILIS    [x]  Treated with cephalexin, Enterococcus faecalis is resistant to prescribed antimicrobial  New antibiotic prescription: amoxicillin 500mg  by mouth every 8 hours x 10 days. Stop cephalexin.  ED Provider: Luther Hearing, PA-C   Doristine Counter, PharmD, BCPS 10/23/2022, 12:46 PM Clinical Pharmacist Monday - Friday phone -  [phone] Saturday - Sunday phone - [phone]

## 2022-10-28 ENCOUNTER — Other Ambulatory Visit: Payer: Self-pay | Admitting: Student in an Organized Health Care Education/Training Program

## 2022-11-04 ENCOUNTER — Telehealth: Payer: Self-pay | Admitting: Student in an Organized Health Care Education/Training Program

## 2022-11-04 NOTE — Telephone Encounter (Signed)
Pt's Last LOV was  04/08/204  HYDROcodone-acetaminophen (NORCO) 7.5-325 MG tablet    [id number] [place], [place] - [address] (Ph: [phone])

## 2022-11-06 ENCOUNTER — Other Ambulatory Visit: Payer: Self-pay | Admitting: Student in an Organized Health Care Education/Training Program

## 2022-11-07 NOTE — Telephone Encounter (Signed)
[facility] sent [place] a fax and informed us that [name] declined to schedule a clinic appointment with [facility] therefore his referral has been cancelled. If [name] changes his mind a new referral will need to be placed.

## 2022-11-07 NOTE — Telephone Encounter (Signed)
Patient with recent hypotension and weight loss. We are holding metoprolol until BP normalizes. Will follow up with patient in the office soon.

## 2022-11-08 ENCOUNTER — Other Ambulatory Visit: Payer: Self-pay

## 2022-11-08 DIAGNOSIS — M25552 Pain in left hip: Secondary | ICD-10-CM

## 2022-11-08 DIAGNOSIS — Z79891 Long term (current) use of opiate analgesic: Secondary | ICD-10-CM

## 2022-11-08 MED ORDER — HYDROCODONE-ACETAMINOPHEN 7.5-325 MG PO TABS
1.0000 | ORAL_TABLET | Freq: Two times a day (BID) | ORAL | 0 refills | Status: DC | PRN
Start: 2022-11-08 — End: 2022-12-10

## 2022-11-21 DIAGNOSIS — H40053 Ocular hypertension, bilateral: Secondary | ICD-10-CM | POA: Diagnosis not present

## 2022-11-21 DIAGNOSIS — H10413 Chronic giant papillary conjunctivitis, bilateral: Secondary | ICD-10-CM | POA: Diagnosis not present

## 2022-11-21 DIAGNOSIS — Z961 Presence of intraocular lens: Secondary | ICD-10-CM | POA: Diagnosis not present

## 2022-11-21 DIAGNOSIS — H04123 Dry eye syndrome of bilateral lacrimal glands: Secondary | ICD-10-CM | POA: Diagnosis not present

## 2022-12-02 ENCOUNTER — Other Ambulatory Visit: Payer: Self-pay | Admitting: Physician Assistant

## 2022-12-02 DIAGNOSIS — R338 Other retention of urine: Secondary | ICD-10-CM | POA: Diagnosis not present

## 2022-12-03 ENCOUNTER — Other Ambulatory Visit: Payer: Self-pay | Admitting: Student in an Organized Health Care Education/Training Program

## 2022-12-03 MED ORDER — CLOPIDOGREL BISULFATE 75 MG PO TABS: 75.0000 mg | ORAL_TABLET | Freq: Every day | ORAL | 3 refills | Status: DC

## 2022-12-03 MED ORDER — ZENPEP 20000-63000 UNITS PO CPEP: 3.0000 | ORAL_CAPSULE | Freq: Three times a day (TID) | ORAL | 3 refills | Status: DC

## 2022-12-03 NOTE — Telephone Encounter (Signed)
Pancrelipase, Lip-Prot-Amyl, (ZENPEP) 20000-63000 units CPEP  clopidogrel (PLAVIX) 75 MG tablet   [id number] - [place], [place] - [address]

## 2022-12-06 ENCOUNTER — Other Ambulatory Visit: Payer: Self-pay | Admitting: Student in an Organized Health Care Education/Training Program

## 2022-12-09 NOTE — Telephone Encounter (Signed)
HYDROcodone-acetaminophen (NORCO) 7.5-325 MG tablet   [id number] - [place], [place] - [address]   

## 2022-12-10 ENCOUNTER — Other Ambulatory Visit: Payer: Self-pay

## 2022-12-10 ENCOUNTER — Other Ambulatory Visit: Payer: Self-pay | Admitting: Student in an Organized Health Care Education/Training Program

## 2022-12-10 DIAGNOSIS — Z79891 Long term (current) use of opiate analgesic: Secondary | ICD-10-CM

## 2022-12-10 DIAGNOSIS — F32A Depression, unspecified: Secondary | ICD-10-CM

## 2022-12-10 DIAGNOSIS — M25551 Pain in right hip: Secondary | ICD-10-CM

## 2022-12-10 MED ORDER — HYDROCODONE-ACETAMINOPHEN 7.5-325 MG PO TABS
1.0000 | ORAL_TABLET | Freq: Two times a day (BID) | ORAL | 0 refills | Status: DC | PRN
Start: 2022-12-10 — End: 2023-01-06

## 2022-12-17 NOTE — Telephone Encounter (Signed)
Last refill was 12/10/22.

## 2023-01-06 ENCOUNTER — Other Ambulatory Visit: Payer: Self-pay

## 2023-01-06 DIAGNOSIS — M25551 Pain in right hip: Secondary | ICD-10-CM

## 2023-01-06 DIAGNOSIS — Z79891 Long term (current) use of opiate analgesic: Secondary | ICD-10-CM

## 2023-01-06 MED ORDER — HYDROCODONE-ACETAMINOPHEN 7.5-325 MG PO TABS
1.0000 | ORAL_TABLET | Freq: Two times a day (BID) | ORAL | 0 refills | Status: DC | PRN
Start: 2023-01-06 — End: 2023-01-27

## 2023-01-06 MED ORDER — ROSUVASTATIN CALCIUM 40 MG PO TABS: 40.0000 mg | ORAL_TABLET | Freq: Every day | ORAL | 3 refills | Status: DC

## 2023-01-13 ENCOUNTER — Other Ambulatory Visit: Payer: Self-pay

## 2023-01-13 MED ORDER — ZENPEP 20000-63000 UNITS PO CPEP: 3.0000 | ORAL_CAPSULE | Freq: Three times a day (TID) | ORAL | 3 refills | Status: DC

## 2023-01-19 IMAGING — CT CT CHEST W/ CM
2 of 4 series · 15 of 36 positions shown, 18 images · IV contrast (omnipaque)
Comparison: 05/06/2017.

CLINICAL DATA: Unintended weight loss.  History of lung cancer.

EXAM:
CT CHEST WITH CONTRAST
TECHNIQUE: Multidetector CT imaging of the chest was performed during
intravenous contrast administration.
CONTRAST:  75mL OMNIPAQUE IOHEXOL 300 MG/ML  SOLN

[Series 2: axial st · axial · 0.76mm/px · z∈[+140,+404]mm · 12 of 156 slices shown, 15 images]
[im 12/156  mediastinal]
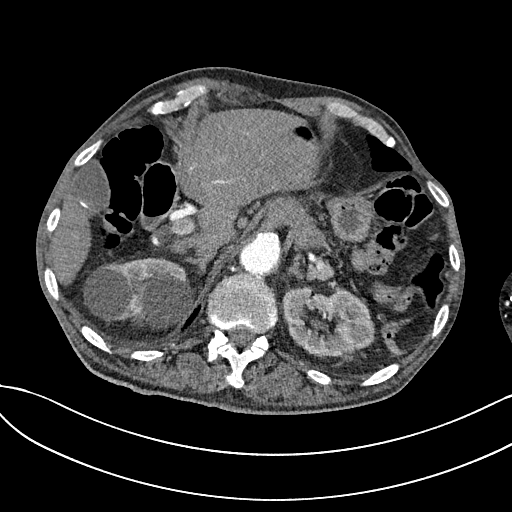
[im 12/156  lung]
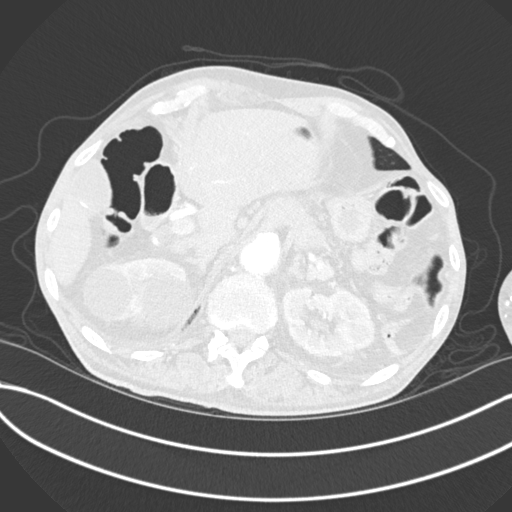
[im 24/156  lung]
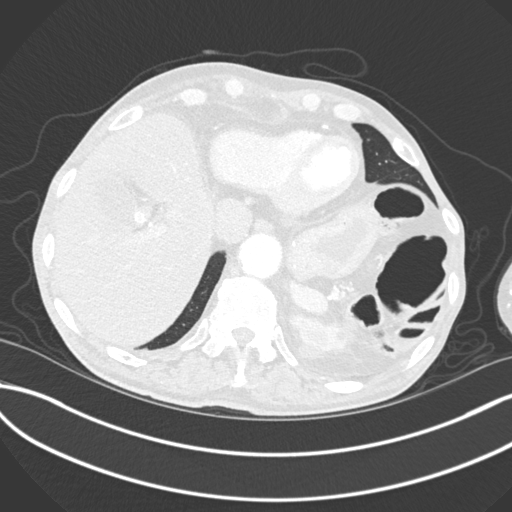
[im 36/156  lung]
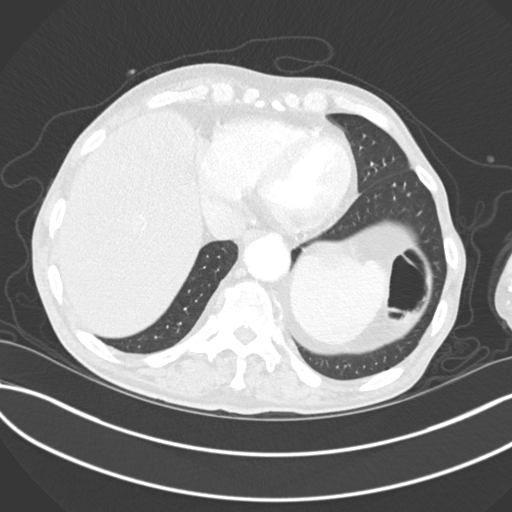
[im 48/156  lung]
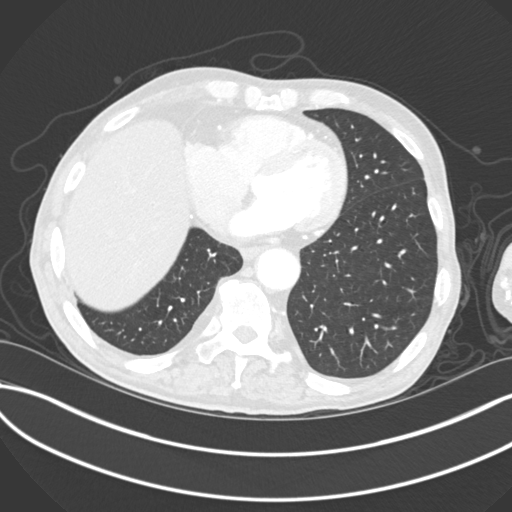
[im 60/156  mediastinal]
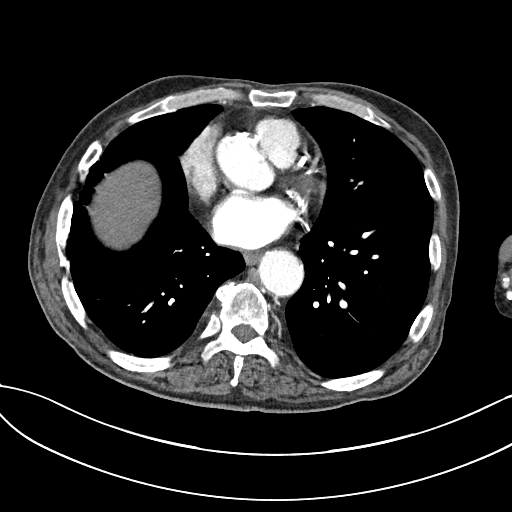
[im 60/156  lung]
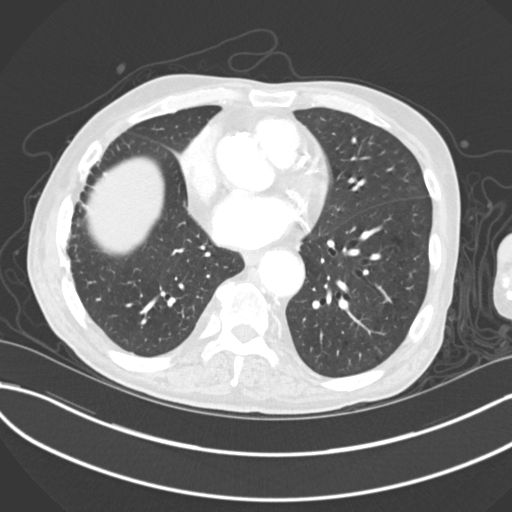
[im 72/156  lung]
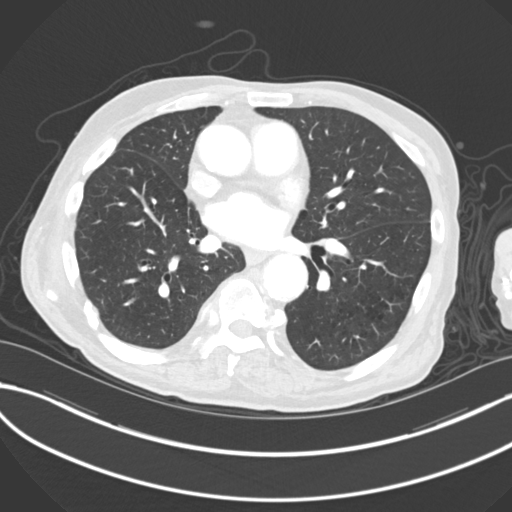
[im 84/156  lung]
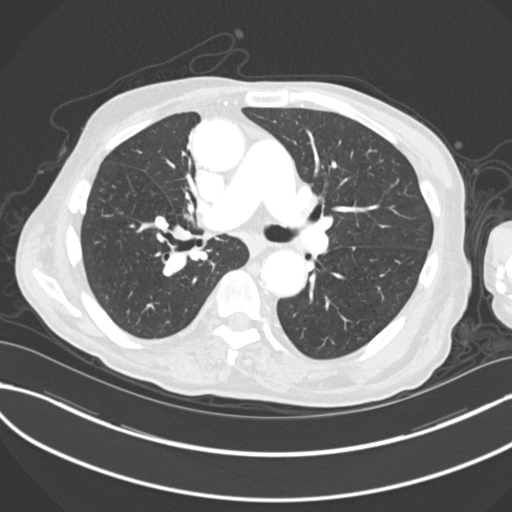
[im 96/156  lung]
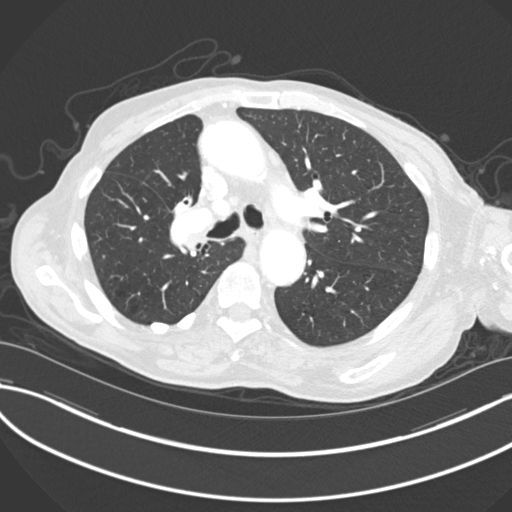
[im 108/156  mediastinal]
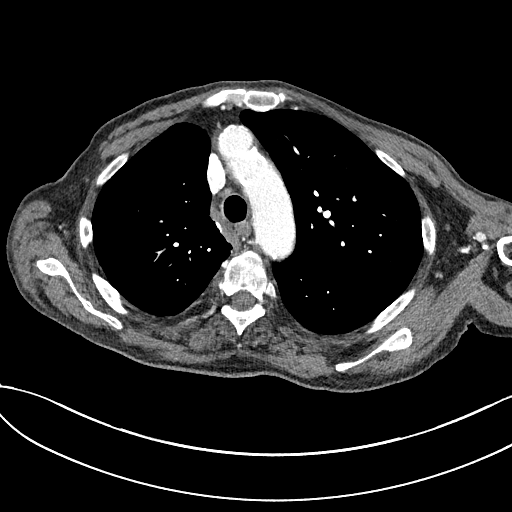
[im 108/156  lung]
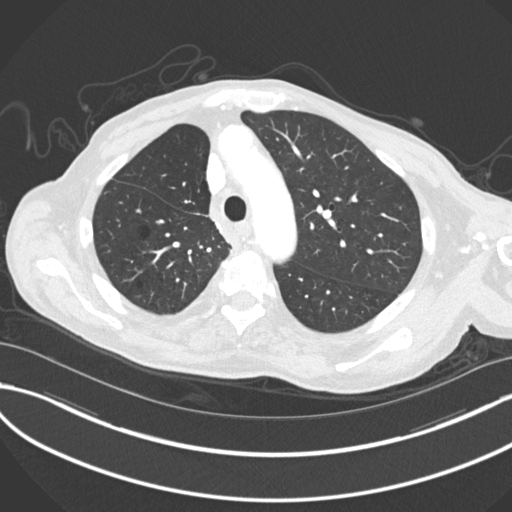
[im 120/156  lung]
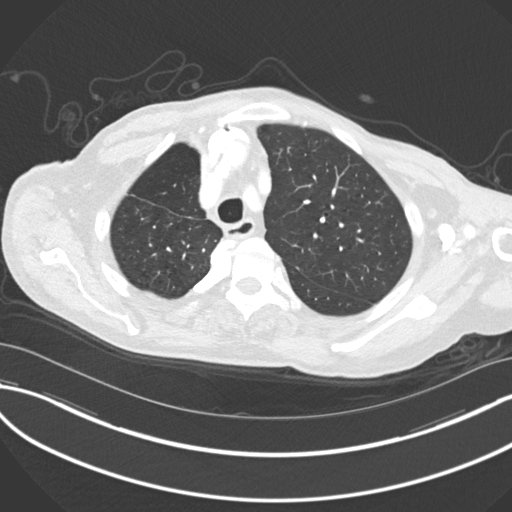
[im 132/156  lung]
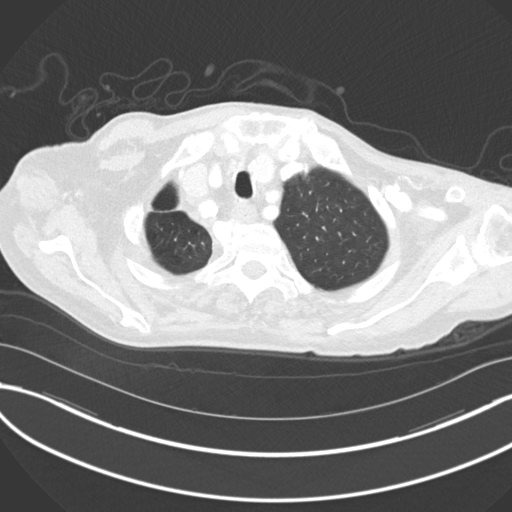
[im 144/156  lung]
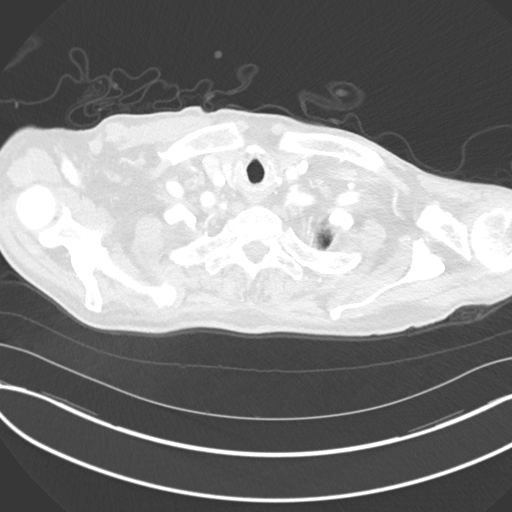

[Series 5: coronal · coronal · 0.65mm/px · 3 of 134 slices shown]
[im 27/134  lung]
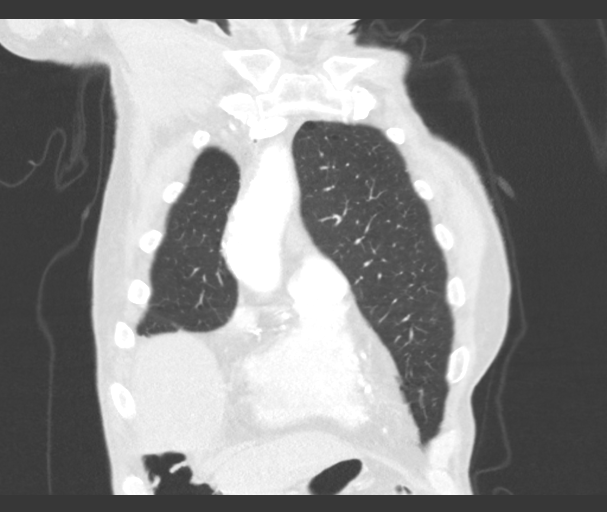
[im 54/134  lung]
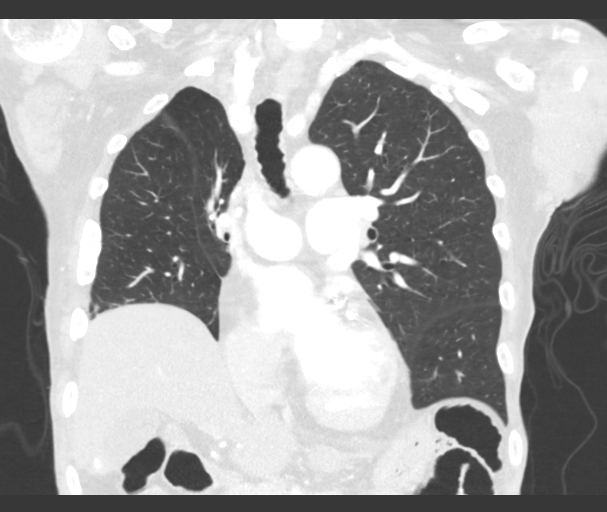
[im 80/134  lung]
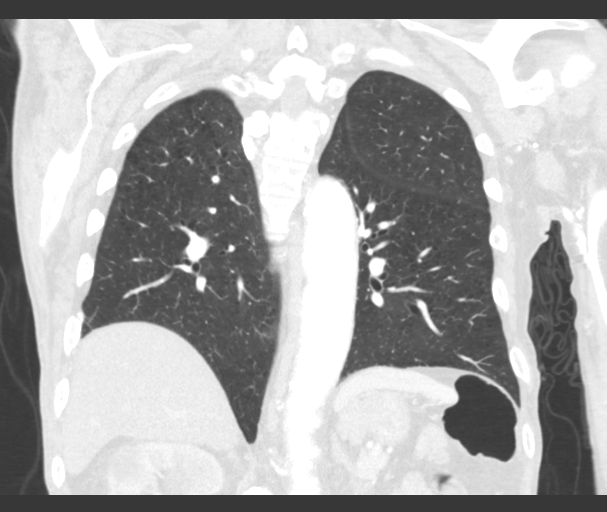

[15 of 36 positions shown; findings below may reference images not displayed]

FINDINGS: Cardiovascular: Atherosclerotic calcification of the aorta, aortic
valve and coronary arteries. Pulmonic trunk is enlarged. Heart is at
the upper limits of normal in size. No pericardial effusion.

Mediastinum/Nodes: No pathologically enlarged mediastinal, hilar or
axillary lymph nodes. Esophagus is grossly unremarkable.

Lungs/Pleura: Centrilobular emphysema. Right upper lobectomy. Lungs
are otherwise clear. No pleural fluid. Airway is otherwise
unremarkable.

Upper Abdomen: Visualized portion of the liver is decreased in
attenuation. Stones are seen in the gallbladder. Adrenal glands are
unremarkable. Low-attenuation lesions in the kidneys measure up to
4.7 cm, the largest of which is incompletely imaged. Mildly
hyperdense lesion off the upper pole left kidney measures 1.5 x
cm, is stable in size from 04/02/2018 at which time it was also
mildly hyperdense, favoring a hyperdense cyst. Definitive
characterization is limited though on the current study without
precontrast imaging. Visualized portions of the spleen, pancreas,
stomach and bowel are grossly unremarkable. Elevated left
hemidiaphragm. Tiny stone in the left kidney.

Musculoskeletal: Degenerative changes in the spine. Thoracotomy
changes on the right. No worrisome lytic or sclerotic lesions.
IMPRESSION: 1. No evidence of recurrent or metastatic disease.
2. Hepatic steatosis.
3. Cholelithiasis.
4. Left renal stone.
5. Aortic atherosclerosis ([id number].P). Coronary artery
calcification.
6. Enlarged pulmonic trunk, indicative of pulmonary arterial
hypertension.
7.  Emphysema ([id number]-[id number].S).

## 2023-01-27 ENCOUNTER — Encounter: Payer: Self-pay | Admitting: Student in an Organized Health Care Education/Training Program

## 2023-01-27 ENCOUNTER — Ambulatory Visit (INDEPENDENT_AMBULATORY_CARE_PROVIDER_SITE_OTHER): Payer: 59 | Admitting: Student in an Organized Health Care Education/Training Program

## 2023-01-27 VITALS — BP 159/71 | HR 57 | Temp 98.3°F | Ht 73.0 in | Wt 131.7 lb

## 2023-01-27 DIAGNOSIS — R339 Retention of urine, unspecified: Secondary | ICD-10-CM | POA: Diagnosis not present

## 2023-01-27 DIAGNOSIS — I7142 Juxtarenal abdominal aortic aneurysm, without rupture: Secondary | ICD-10-CM | POA: Diagnosis not present

## 2023-01-27 DIAGNOSIS — F17210 Nicotine dependence, cigarettes, uncomplicated: Secondary | ICD-10-CM

## 2023-01-27 DIAGNOSIS — Z79891 Long term (current) use of opiate analgesic: Secondary | ICD-10-CM | POA: Diagnosis not present

## 2023-01-27 DIAGNOSIS — E43 Unspecified severe protein-calorie malnutrition: Secondary | ICD-10-CM

## 2023-01-27 DIAGNOSIS — M25552 Pain in left hip: Secondary | ICD-10-CM

## 2023-01-27 DIAGNOSIS — I10 Essential (primary) hypertension: Secondary | ICD-10-CM | POA: Diagnosis not present

## 2023-01-27 DIAGNOSIS — F17200 Nicotine dependence, unspecified, uncomplicated: Secondary | ICD-10-CM

## 2023-01-27 DIAGNOSIS — M25551 Pain in right hip: Secondary | ICD-10-CM

## 2023-01-27 MED ORDER — HYDROCODONE-ACETAMINOPHEN 7.5-325 MG PO TABS
1.0000 | ORAL_TABLET | Freq: Two times a day (BID) | ORAL | 0 refills | Status: DC | PRN
Start: 2023-01-27 — End: 2023-01-27

## 2023-01-27 MED ORDER — HYDROCODONE-ACETAMINOPHEN 7.5-325 MG PO TABS
1.0000 | ORAL_TABLET | Freq: Two times a day (BID) | ORAL | 0 refills | Status: DC | PRN
Start: 2023-01-27 — End: 2023-02-11

## 2023-01-27 MED ORDER — AMLODIPINE BESYLATE 5 MG PO TABS: 5.0000 mg | ORAL_TABLET | Freq: Every day | ORAL | 3 refills | Status: DC

## 2023-01-27 NOTE — Assessment & Plan Note (Signed)
Chronic and improving.  Weight today is 131 pounds with a BMI of 17.4.  This is a 13 pound improvement from our last visit.  She is to be recovering nicely after surgical AAA repair and suprapubic catheter placement.  I think he also has chronic pancreatic insufficiency contributing to the malnutrition which is being well-managed with pancreatic enzyme replacement.  No signs of depression today, mood is well-controlled.  Will continue with escitalopram 5 mg daily and mirtazapine 15 mg daily.  Follow-up in 3 months for another weight recheck.

## 2023-01-27 NOTE — Assessment & Plan Note (Signed)
Surgically managed in March and doing much better.  Exam is normal today.  He has no abdominal discomfort and is gaining weight again.  We are can to continue aspirin and Plavix, has had no bleeding issues.  Continue with rosuvastatin as well.  Hypertension has become an issue again and so we are restarting amlodipine.

## 2023-01-27 NOTE — Assessment & Plan Note (Signed)
Previously long history of hypertension, but he developed hypotension in the setting of malnutrition and weight loss.  Now that his appetite is better and his weight loss has improved, his blood pressure has become more elevated.  Because of his significant vascular disease I would like to try back on low-dose of antihypertensive.  Plan to start amlodipine 5 mg daily today.  But I gave him clear instructions to stop taking this medication if he has any lightheadedness on standing.

## 2023-01-27 NOTE — Progress Notes (Signed)
Established Patient Office Visit  Subjective   Patient ID: [name], male    DOB: 1945/10/23  Age: 77 y.o. MRN: [id number]   HPI  77 year old person here for management of severe malnutrition.  Doing much much better since I last saw him about 4 months ago.  He underwent surgical repair of an abdominal aortic aneurysm and shortly after had an insertion of a suprapubic urinary catheter to manage chronic retention.  He has recovered well from both of these surgeries.  He has been eating much better, taking pancreatic enzymes with each meal.  Having about 3 bowel movements per day.  Has not used alcohol, continues to smoke a few cigarettes a day.  Independent in his activities of daily living.  Gets out of the house and goes for a walk every day.  Reports good exertional capacity, no chest pain or pressure with exertion, no dyspnea.  No abdominal pain, no lightheadedness with standing.  He feels much improved, like a new person, enjoying a very good quality of life at home.    Objective:     BP (!) 159/71 (BP Location: Right Arm, Patient Position: Sitting, Cuff Size: Small)   Pulse (!) 57   Temp 98.3 F (36.8 C) (Oral)   Ht 6\' 1"  (1.854 m)   Wt 131 lb 11.2 oz (59.7 kg)   SpO2 99%   BMI 17.38 kg/m    Physical Exam  Gen: Well-appearing man Neck: Normal thyroid, no lymphadenopathy CV: Regular rate, no murmurs Lungs: Unlabored, clear throughout Abd: Thin, soft, nontender, no pulsating masses or organomegaly Ext: Warm well-perfused with no lower extremity edema Neuro: Alert, conversational, full strength in the upper and lower extremities, slow wide-based gait    Assessment & Plan:   Problem List Items Addressed This Visit       High   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  He continues to find benefit from using hydrocodone 7.5 mg twice daily.  This medicine helps with his  activities of daily living.  He has had no adverse side effects.  Will continue with the hydrocodone, I prescribed #60 tablets for 30-day supply and sent to refills to his pharmacy.  Database was appropriate.      Relevant Medications   HYDROcodone-acetaminophen (NORCO) 7.5-325 MG tablet   Protein-calorie malnutrition, severe (HCC) (Chronic)    Chronic and improving.  Weight today is 131 pounds with a BMI of 17.4.  This is a 13 pound improvement from our last visit.  She is to be recovering nicely after surgical AAA repair and suprapubic catheter placement.  I think he also has chronic pancreatic insufficiency contributing to the malnutrition which is being well-managed with pancreatic enzyme replacement.  No signs of depression today, mood is well-controlled.  Will continue with escitalopram 5 mg daily and mirtazapine 15 mg daily.  Follow-up in 3 months for another weight recheck.      Tobacco use disorder - Primary (Chronic)     Medium    Essential hypertension (Chronic)    Previously long history of hypertension, but he developed hypotension in the setting of malnutrition and weight loss.  Now that his appetite is better and his weight loss has improved, his blood pressure has become more elevated.  Because of his significant vascular disease I would like to try back on low-dose of antihypertensive.  Plan to start amlodipine 5 mg daily  today.  But I gave him clear instructions to stop taking this medication if he has any lightheadedness on standing.      Relevant Medications   amLODipine (NORVASC) 5 MG tablet   AAA (abdominal aortic aneurysm) (HCC) (Chronic)    Surgically managed in March and doing much better.  Exam is normal today.  He has no abdominal discomfort and is gaining weight again.  We are can to continue aspirin and Plavix, has had no bleeding issues.  Continue with rosuvastatin as well.  Hypertension has become an issue again and so we are restarting amlodipine.      Relevant  Medications   amLODipine (NORVASC) 5 MG tablet   Urinary retention (Chronic)    Managed by urology, now has a suprapubic catheter in place draining to a leg bag.  He has been managing this well at home.  He has the catheters replaced every 4 weeks, scheduled to go to the urology office for this tomorrow.  No signs of infection.  This seems to be his long-term strategy to manage the urinary retention.      Other Visit Diagnoses     Bilateral hip pain       Relevant Medications   HYDROcodone-acetaminophen (NORCO) 7.5-325 MG tablet       Return in about 3 months (around 04/29/2023).    Tyson Alias, MD

## 2023-01-27 NOTE — Assessment & Plan Note (Signed)
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  He continues to find benefit from using hydrocodone 7.5 mg twice daily.  This medicine helps with his activities of daily living.  He has had no adverse side effects.  Will continue with the hydrocodone, I prescribed #60 tablets for 30-day supply and sent to refills to his pharmacy.  Database was appropriate.

## 2023-01-27 NOTE — Assessment & Plan Note (Signed)
Managed by urology, now has a suprapubic catheter in place draining to a leg bag.  He has been managing this well at home.  He has the catheters replaced every 4 weeks, scheduled to go to the urology office for this tomorrow.  No signs of infection.  This seems to be his long-term strategy to manage the urinary retention.

## 2023-02-03 ENCOUNTER — Other Ambulatory Visit: Payer: Self-pay | Admitting: Internal Medicine

## 2023-02-03 ENCOUNTER — Other Ambulatory Visit: Payer: Self-pay | Admitting: Student in an Organized Health Care Education/Training Program

## 2023-02-11 ENCOUNTER — Other Ambulatory Visit: Payer: Self-pay

## 2023-02-11 DIAGNOSIS — Z79891 Long term (current) use of opiate analgesic: Secondary | ICD-10-CM

## 2023-02-11 DIAGNOSIS — M25551 Pain in right hip: Secondary | ICD-10-CM

## 2023-02-11 MED ORDER — HYDROCODONE-ACETAMINOPHEN 7.5-325 MG PO TABS
1.0000 | ORAL_TABLET | Freq: Two times a day (BID) | ORAL | 0 refills | Status: DC | PRN
Start: 2023-02-11 — End: 2023-03-11

## 2023-02-14 MED ORDER — ZENPEP 20000-63000 UNITS PO CPEP: 3.0000 | ORAL_CAPSULE | Freq: Three times a day (TID) | ORAL | 3 refills | Status: DC

## 2023-03-11 ENCOUNTER — Other Ambulatory Visit: Payer: Self-pay

## 2023-03-11 DIAGNOSIS — Z79891 Long term (current) use of opiate analgesic: Secondary | ICD-10-CM

## 2023-03-11 DIAGNOSIS — M25552 Pain in left hip: Secondary | ICD-10-CM

## 2023-03-12 MED ORDER — HYDROCODONE-ACETAMINOPHEN 7.5-325 MG PO TABS
1.0000 | ORAL_TABLET | Freq: Two times a day (BID) | ORAL | 0 refills | Status: DC | PRN
Start: 2023-03-12 — End: 2023-04-10

## 2023-03-29 IMAGING — US US ABDOMEN LIMITED
1 series · 14 of 25 positions shown · non-contrast
Comparison: Noncontrast abdominopelvic CT 04/02/2020

CLINICAL DATA: Transaminitis. Patient is not NPO for the current
exam.

EXAM:
ULTRASOUND ABDOMEN LIMITED RIGHT UPPER QUADRANT

[Series 1: us abdomen limited ruq (liver/gb) · 59 acquisitions, 14 frames shown]
[im 1/59]
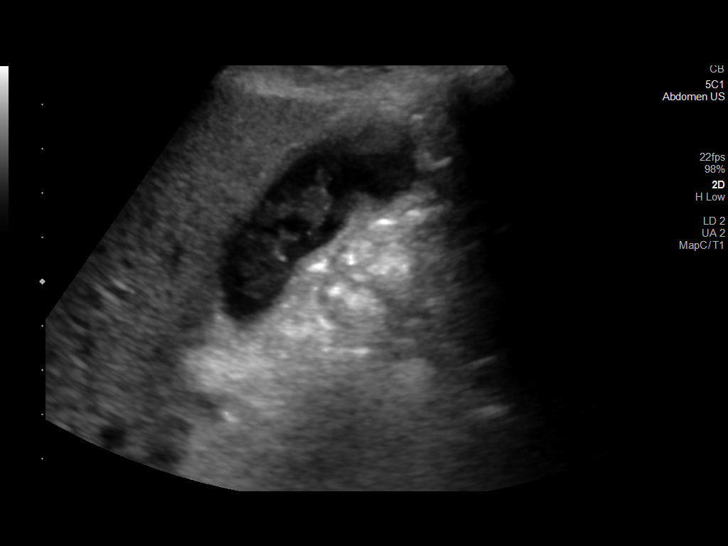
[im 5/59]
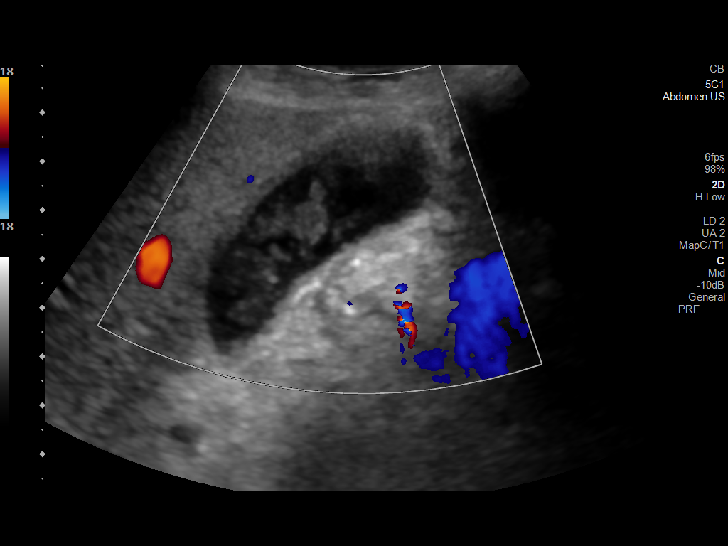
[im 10/59]
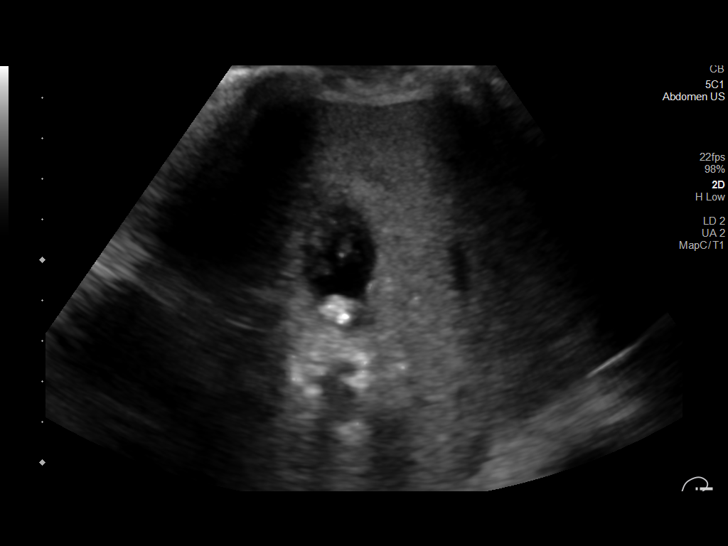
[im 15/59]
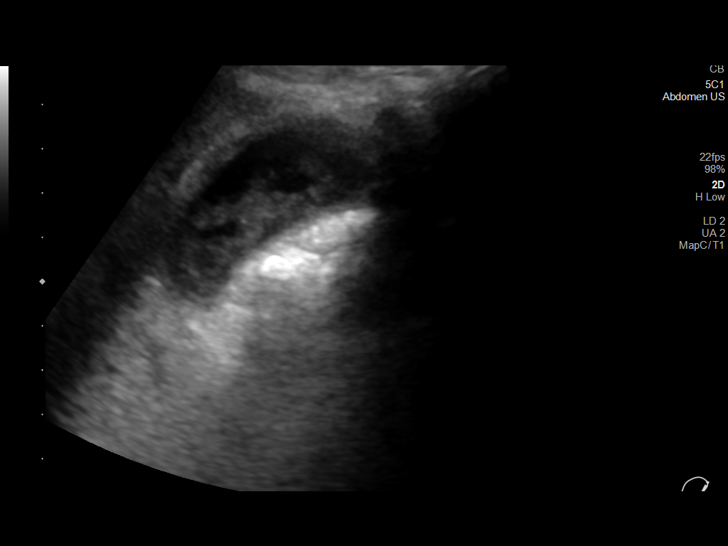
[im 20/59]
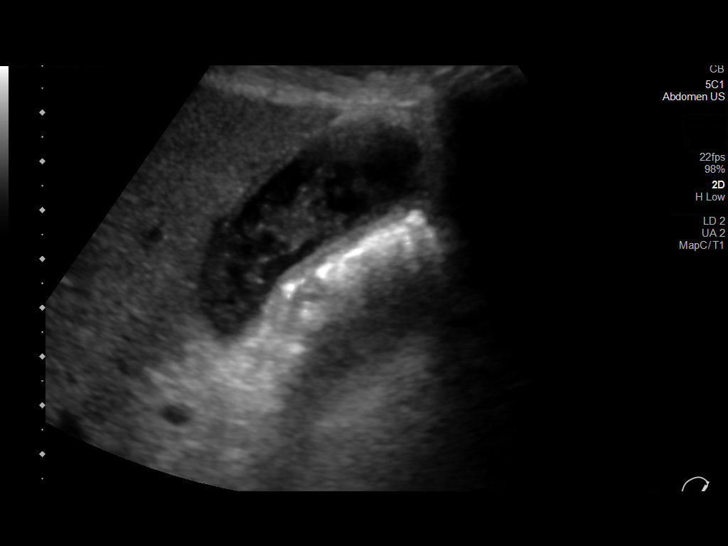
[im 22/59]
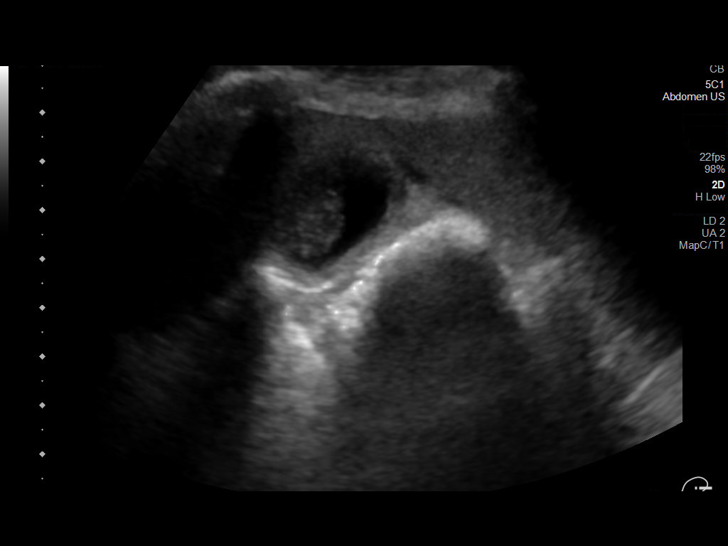
[im 27/59]
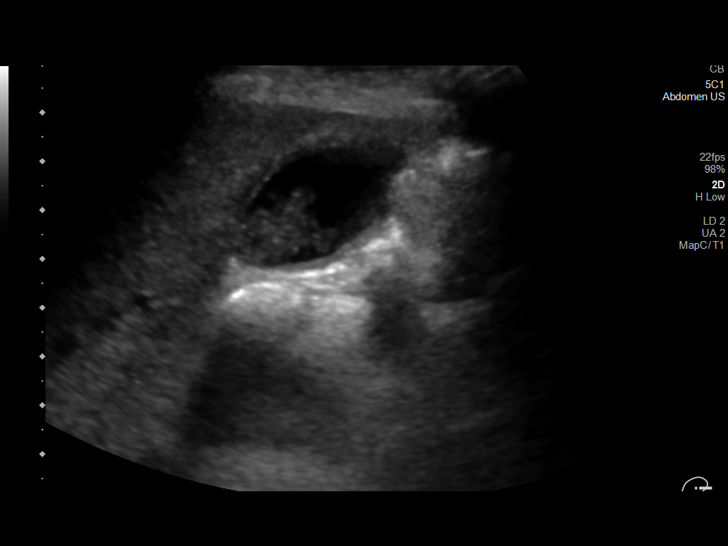
[im 32/59]
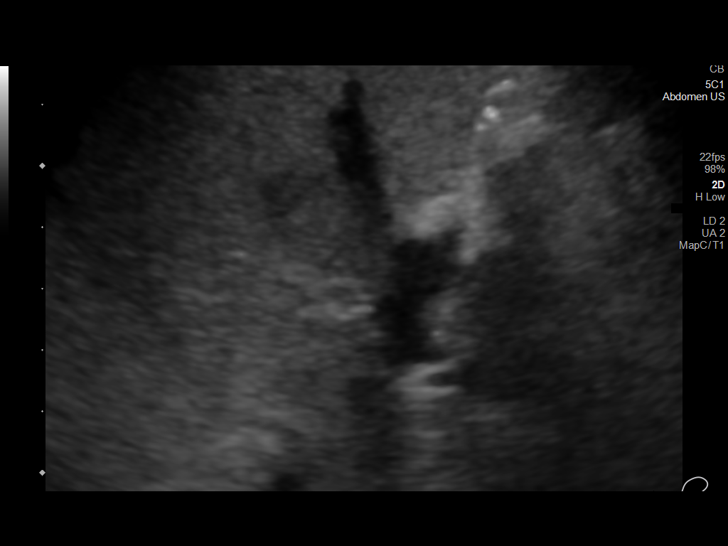
[im 37/59]
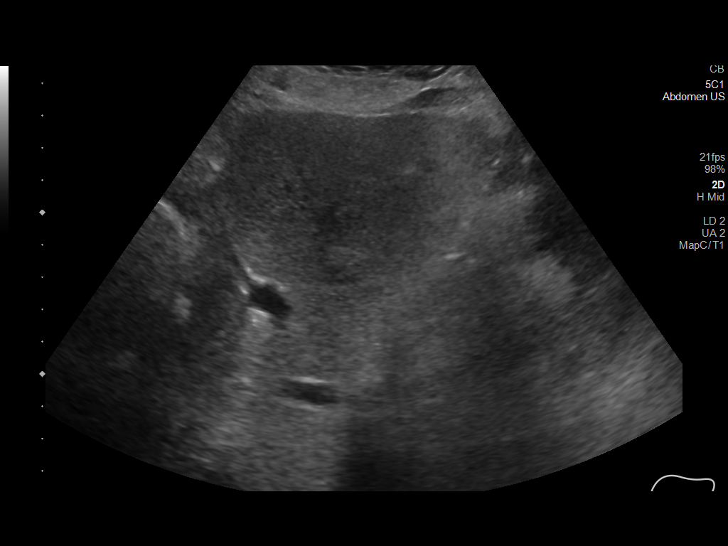
[im 39/59]
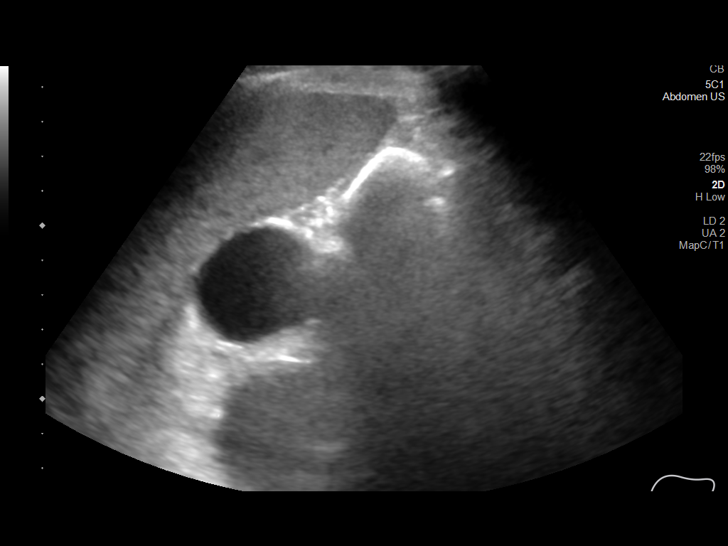
[im 44/59]
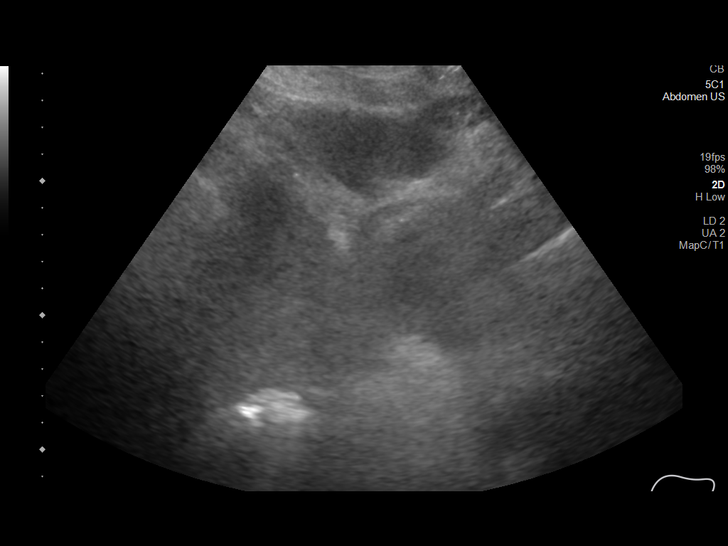
[im 49/59]
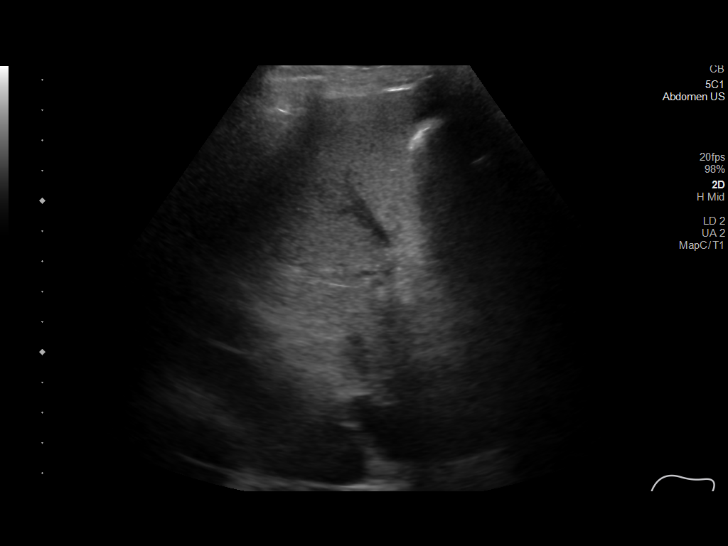
[im 54/59]
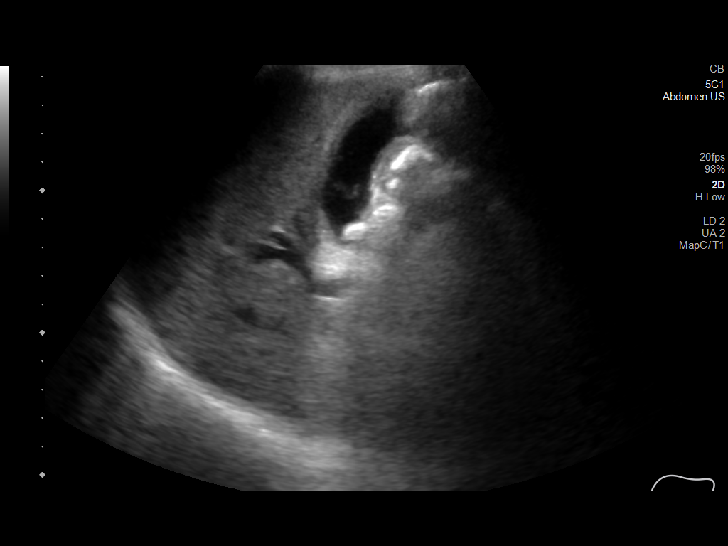
[im 59/59]
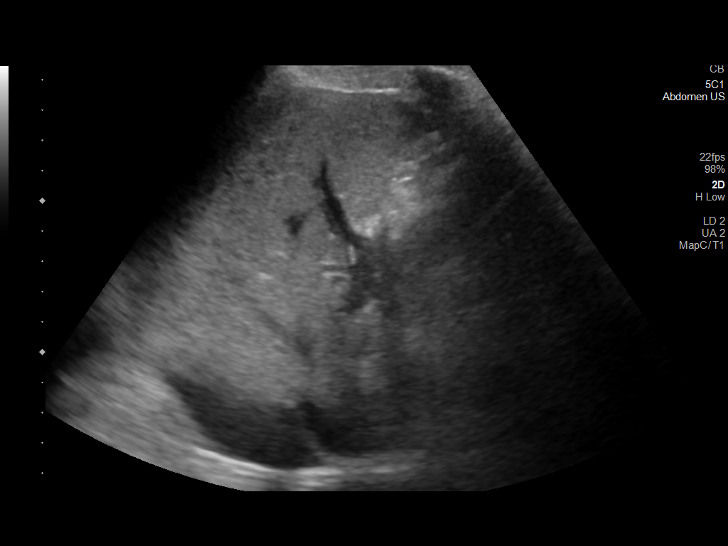

[14 of 25 positions shown; findings below may reference images not displayed]

FINDINGS: Gallbladder:

Physiologically distended containing intraluminal sludge and stones.
No gallbladder wall thickening. Trace free fluid adjacent to the
fundus. No sonographic Murphy sign noted by sonographer.

Common bile duct:

Diameter: 3-4 mm, normal.

Liver:

Heterogeneously increased in parenchymal echogenicity. Technically
limited evaluation of the liver due to shadowing bowel gas. No
evidence of focal lesion. Portal vein is patent on color Doppler
imaging with normal direction of blood flow towards the liver.

Other: None.
IMPRESSION: 1. Heterogeneous increased hepatic parenchymal echogenicity typical
of steatosis.
2. Stones and sludge within the gallbladder. There is trace
pericholecystic fluid, but no other findings of acute cholecystitis.
No biliary dilatation.

## 2023-03-29 IMAGING — DX DG CHEST 1V PORT
1 series · 1 of 1 positions shown · non-contrast
Comparison: 04/08/2008.  CT 08/29/2020.

CLINICAL DATA: Shortness of breath with exertion over the last 4
days.

EXAM:
PORTABLE CHEST 1 VIEW

[chest]
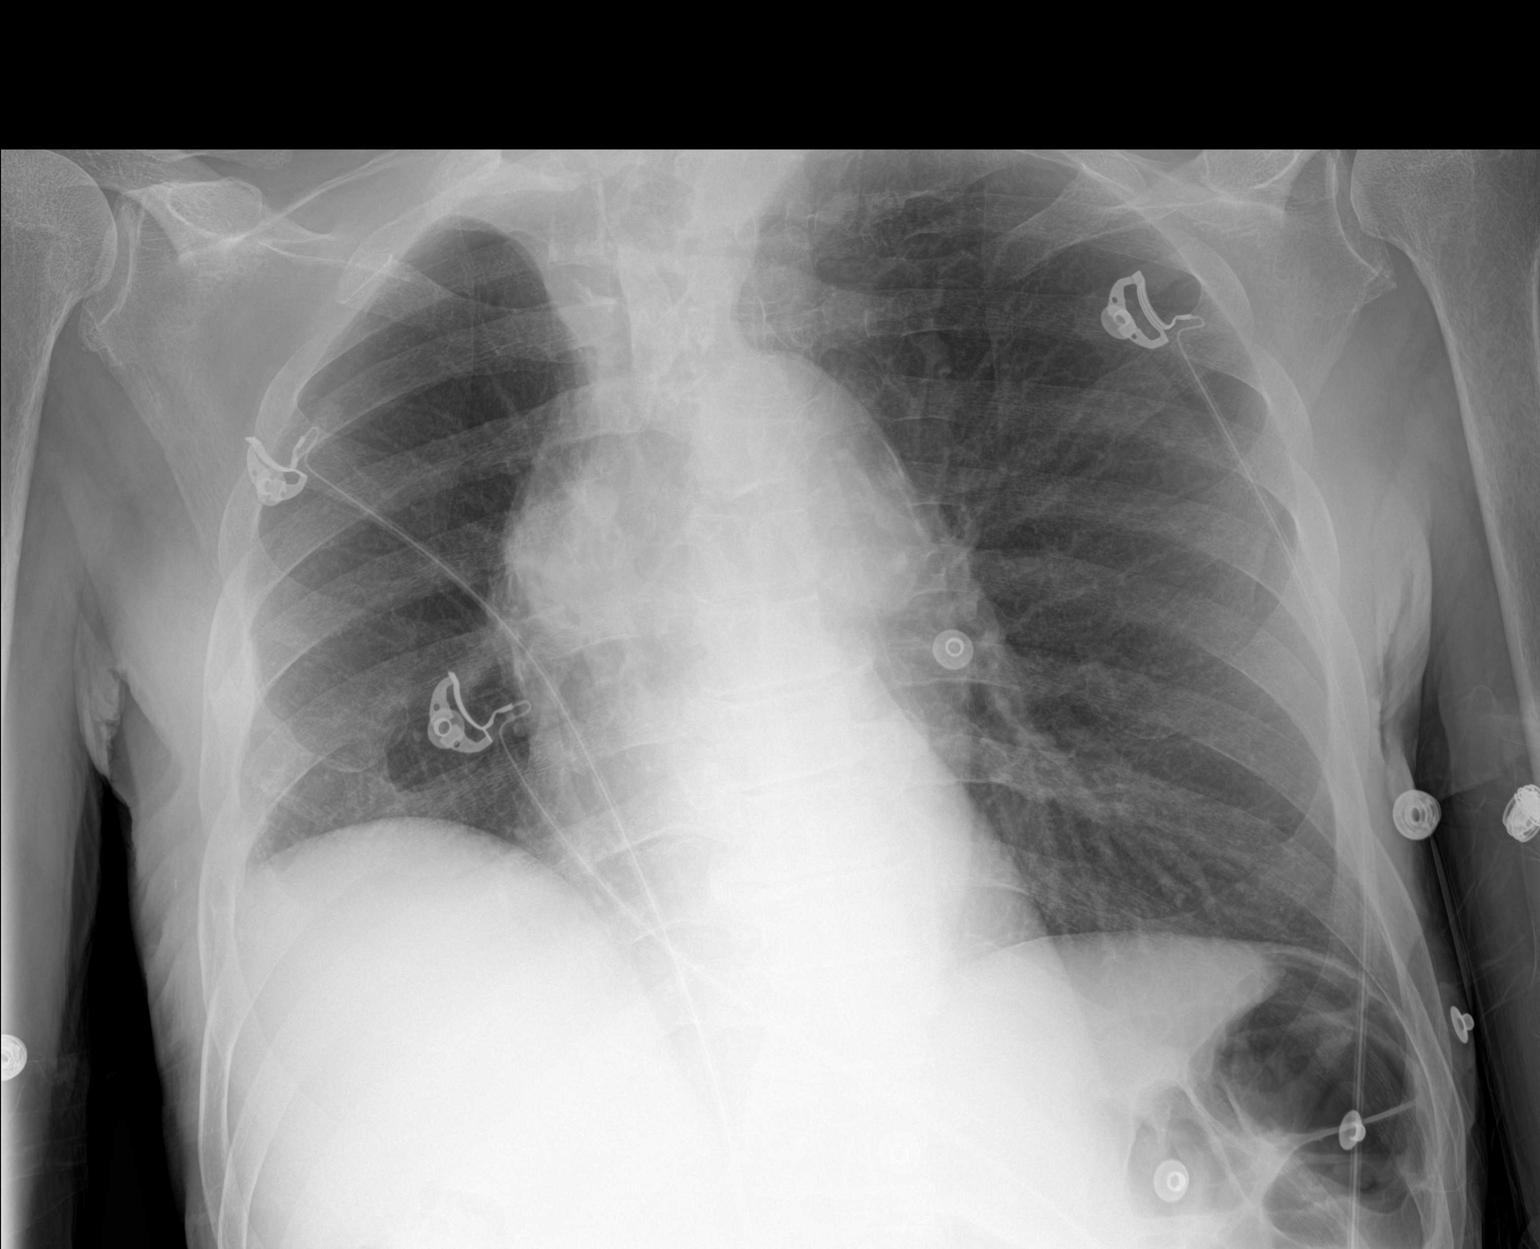

[1 of 1 positions shown; findings below may reference images not displayed]

FINDINGS: Previous right upper lobectomy. Heart size is normal. Chronic aortic
atherosclerotic calcification. Residual lung appears clear. No
evidence of edema, infiltrate, collapse or effusion.
IMPRESSION: Previous right upper lobectomy.  No active disease.

Aortic atherosclerosis.

## 2023-03-30 IMAGING — CT CT ABD-PELV W/ CM
2 of 5 series · 14 of 46 positions shown, 16 images · IV contrast (APPLIED)
Comparison: 04/02/2018.  CT the abdomen and pelvis

CLINICAL DATA: 74-year-old male with history of anemia and
unintended weight loss.

EXAM:
CT ABDOMEN AND PELVIS WITH CONTRAST
TECHNIQUE: Multidetector CT imaging of the abdomen and pelvis was performed
using the standard protocol following bolus administration of
intravenous contrast.
CONTRAST:  100mL OMNIPAQUE IOHEXOL 300 MG/ML  SOLN

[Series 3: abdomen 5.0 · axial · 0.82mm/px · z∈[-436,+9]mm · 11 of 103 slices shown, 13 images]
[im 7/103  soft-tissue]
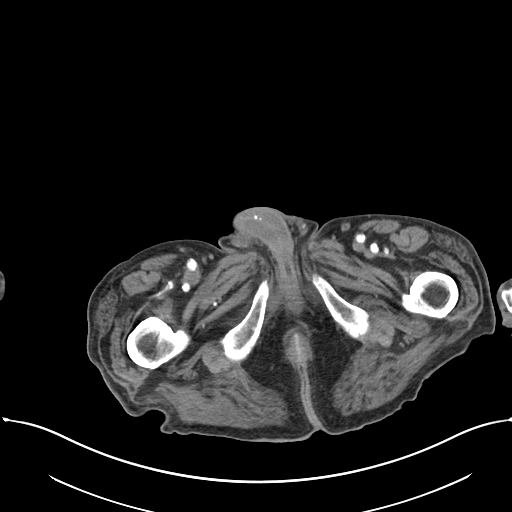
[im 7/103  bone]
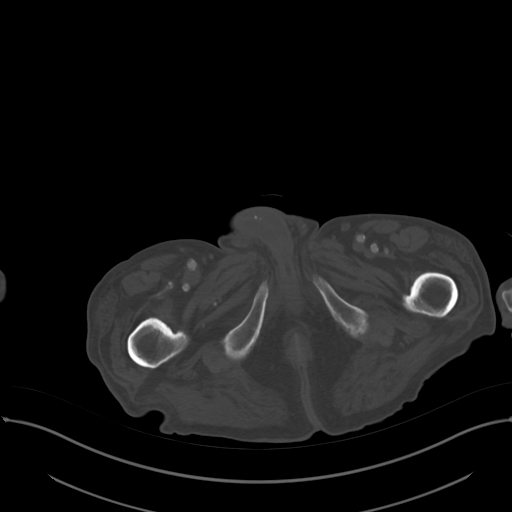
[im 14/103  soft-tissue]
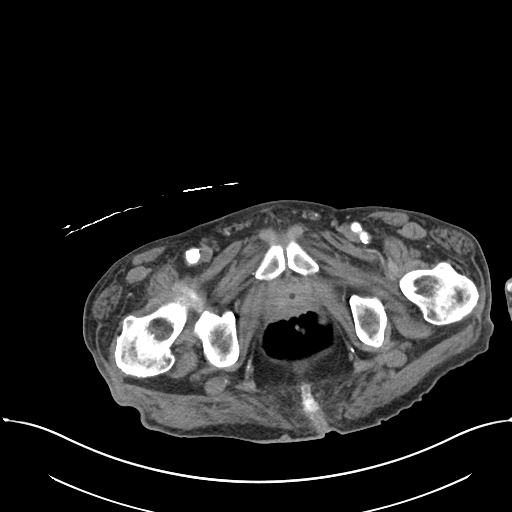
[im 28/103  soft-tissue]
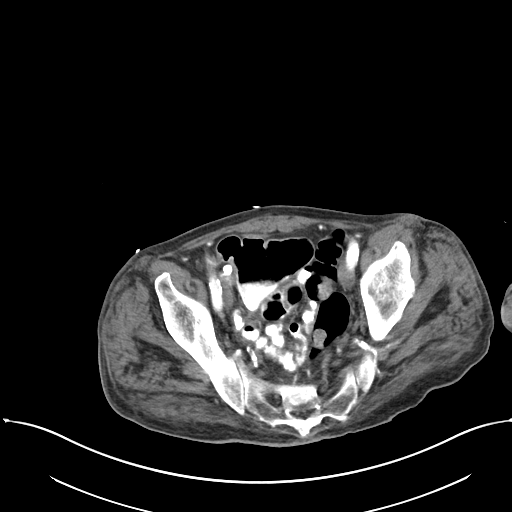
[im 35/103  soft-tissue]
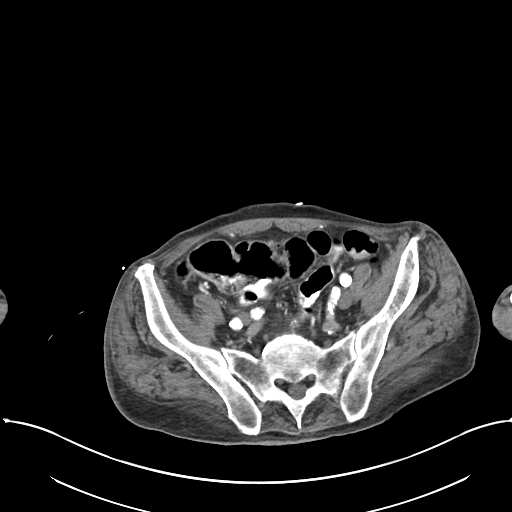
[im 41/103  soft-tissue]
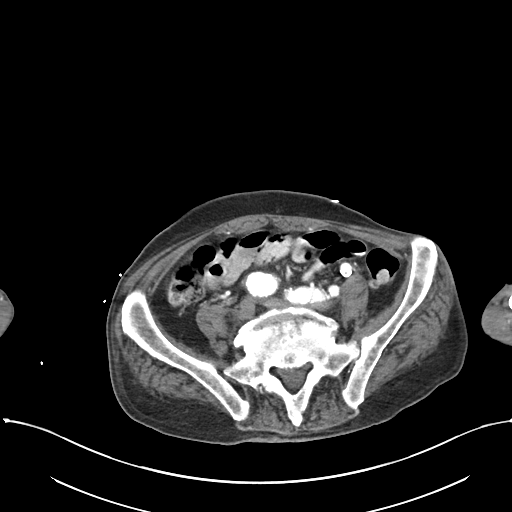
[im 55/103  soft-tissue]
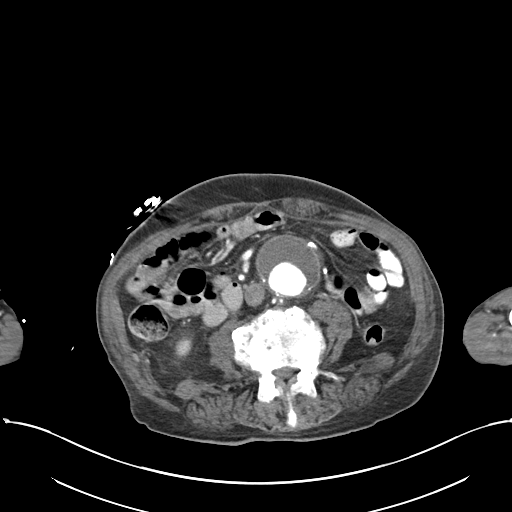
[im 62/103  soft-tissue]
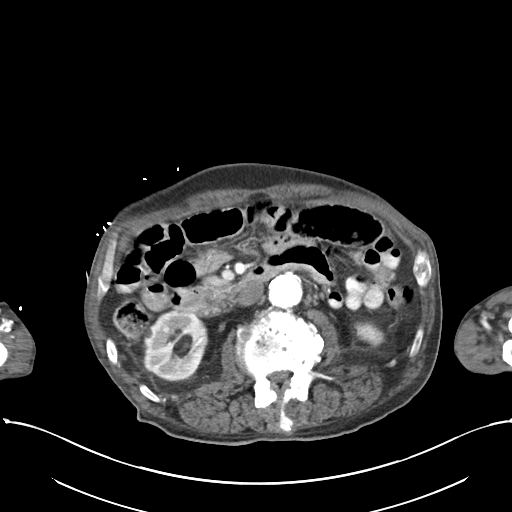
[im 69/103  soft-tissue]
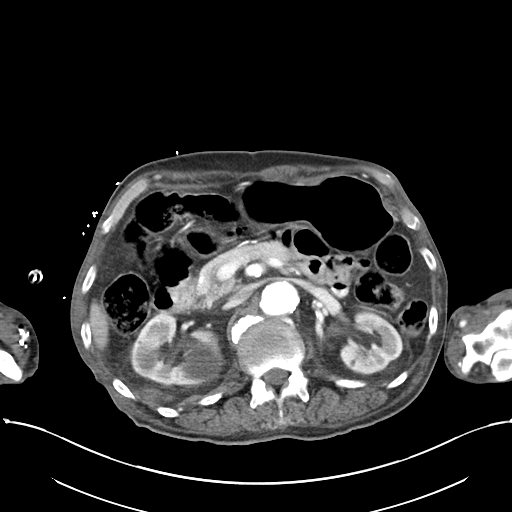
[im 75/103  soft-tissue]
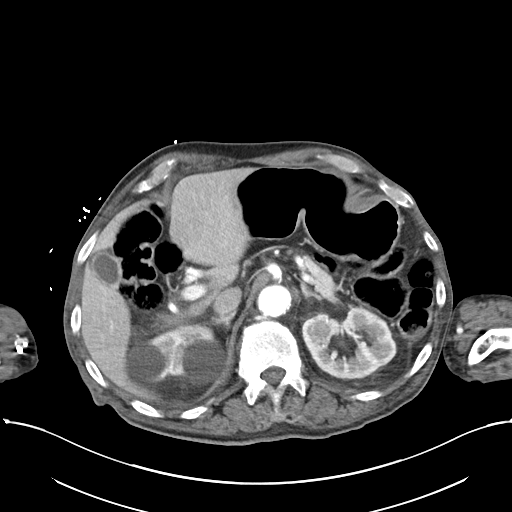
[im 75/103  bone]
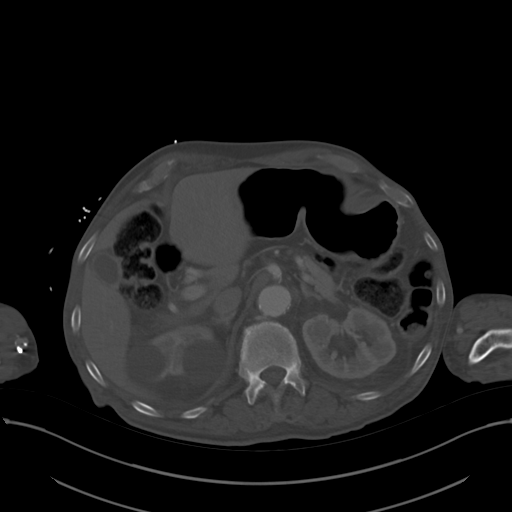
[im 89/103  soft-tissue]
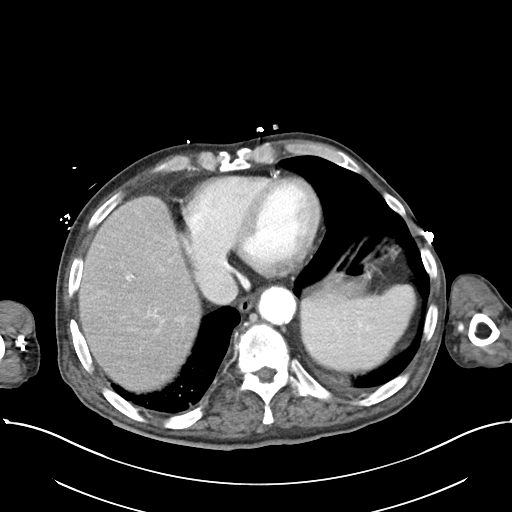
[im 96/103  soft-tissue]
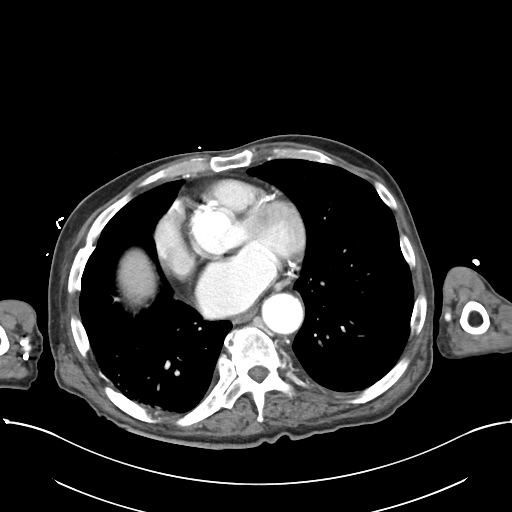

[Series 6: abdomen 3.0 mpr cor · coronal · 0.95mm/px · 3 of 89 slices shown]
[im 30/89  soft-tissue]
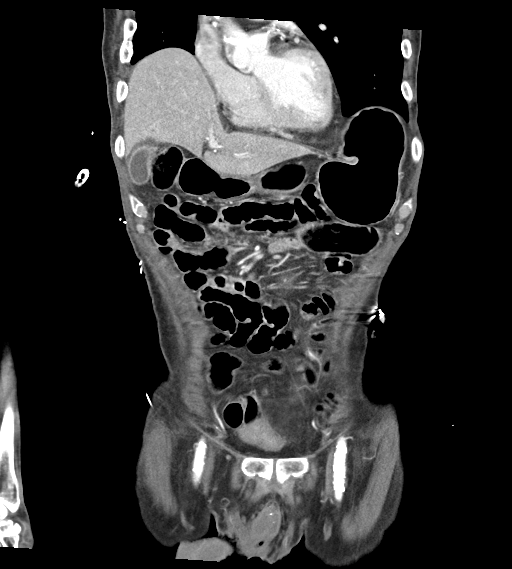
[im 40/89  soft-tissue]
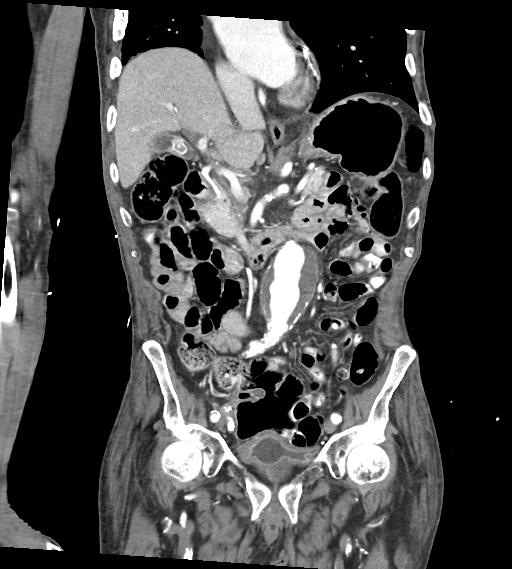
[im 49/89  soft-tissue]
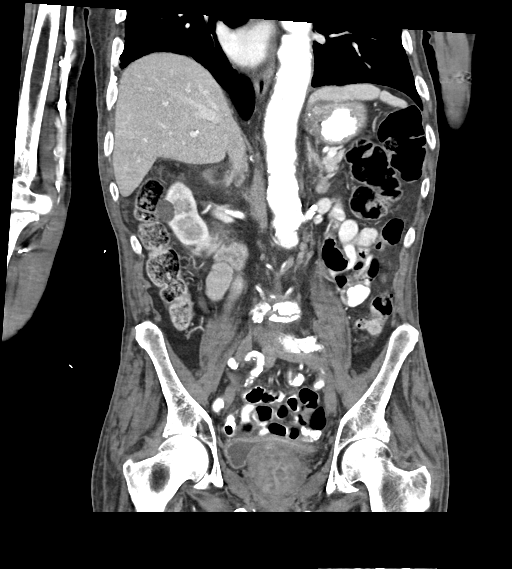

[14 of 46 positions shown; findings below may reference images not displayed]

FINDINGS: Lower chest: Atherosclerotic calcifications in the thoracic aorta as
well as the left main, left anterior descending, left circumflex and
right coronary arteries.

Hepatobiliary: No suspicious cystic or solid hepatic lesions. No
intra or extrahepatic biliary ductal dilatation. Partially calcified
gallstone measuring 1.5 cm in diameter in the neck of the
gallbladder which is nearly decompressed. No pericholecystic fluid
or inflammatory changes.

Pancreas: Ill-defined hypovascular area in the head and uncinate
process of the pancreas (axial image 40 of series 3), concerning for
potential pancreatic mass. There are some calcifications in this
region as well. Body and tail of the pancreas are otherwise normal
in appearance. No pancreatic ductal dilatation. No peripancreatic
fluid collections or inflammatory changes.

Spleen: Unremarkable.

Adrenals/Urinary Tract: Multiple low-attenuation lesions in the
kidneys bilaterally, compatible with simple cysts, largest of which
is in the upper pole of the right kidney measuring 4.6 cm in
diameter. Other subcentimeter low-attenuation lesions in both
kidneys are too small to definitively characterize, but are favored
to represent tiny cysts. Bilateral adrenal glands are normal in
appearance. No hydroureteronephrosis. Urinary bladder is nearly
decompressed around an indwelling Foley balloon catheter, but is
otherwise unremarkable in appearance.

Stomach/Bowel: The appearance of the stomach is normal. There is no
pathologic dilatation of small bowel or colon. Numerous colonic
diverticulae are noted, without surrounding inflammatory changes to
suggest an acute diverticulitis at this time. Normal appendix.

Vascular/Lymphatic: Aortic atherosclerosis with fusiform aneurysmal
dilatation of the infrarenal abdominal aorta which measures up to
5.3 x 5.2 cm in diameter. There is also aneurysmal dilatation of the
right common iliac artery which measures 2.0 cm in diameter. Small
penetrating ulcer noted at the level of the aortic hiatus (axial
image 17 of series 3). Small filling defect in the proximal portal
vein (axial image 32 of series 3) compatible with thrombus. No
lymphadenopathy noted in the abdomen or pelvis.

Reproductive: Prostate gland and seminal vesicles are unremarkable
in appearance.

Other: No significant volume of ascites.  No pneumoperitoneum.

Musculoskeletal: There are no aggressive appearing lytic or blastic
lesions noted in the visualized portions of the skeleton.
IMPRESSION: 1. Ill-defined hypovascular area in the head of the pancreas
concerning for potential pancreatic neoplasm. Further evaluation
with nonemergent abdominal MRI with and without IV gadolinium with
MRCP is recommended in the near future to better evaluate this
finding.
2. Nonocclusive thrombus in the proximal portal vein.
3. Cholelithiasis without evidence of acute cholecystitis at this
time.
4. Aortic atherosclerosis, in addition to left main and 3 vessel
coronary artery disease. In addition, there is a small penetrating
ulcer of the anterior aspect of the distal descending thoracic aorta
at the level of the aortic hiatus, fusiform aneurysmal dilatation of
the infrarenal abdominal aorta which measures up to 5.3 x 5.2 cm in
diameter, and aneurysmal dilatation of the right common iliac artery
which measures 2.0 cm in diameter. Recommend follow-up CT/MR every 6
months and vascular consultation. This recommendation follows ACR
consensus guidelines: White Paper of the ACR Incidental Findings
Committee II on Vascular Findings. [HOSPITAL] [id number];
[DATE].
5. Colonic diverticulosis without evidence of acute diverticulitis
at this time.
6. Additional incidental findings, as above.

## 2023-03-31 IMAGING — MR MR ABDOMEN WO/W CM MRCP
12 of 22 series · 17 of 48 positions shown · IV contrast (G GAD)
Comparison: No prior abdominal MRI. CT the abdomen and pelvis
11/07/2020.

CLINICAL DATA: 74-year-old male with history of possible pancreatic
head mass noted on prior CT examination. Follow-up study.

EXAM:
MRI ABDOMEN WITHOUT AND WITH CONTRAST (INCLUDING MRCP)
TECHNIQUE: Multiplanar multisequence MR imaging of the abdomen was performed
both before and after the administration of intravenous contrast.
Heavily T2-weighted images of the biliary and pancreatic ducts were
obtained, and three-dimensional MRCP images were rendered by post
processing.
CONTRAST:  6mL GADAVIST GADOBUTROL 1 MMOL/ML IV SOLN

[Series 3: cor ssfse nav · coronal · 6.0mm · 0.78mm/px · 1 of 34 slices shown]
[im 1/34]
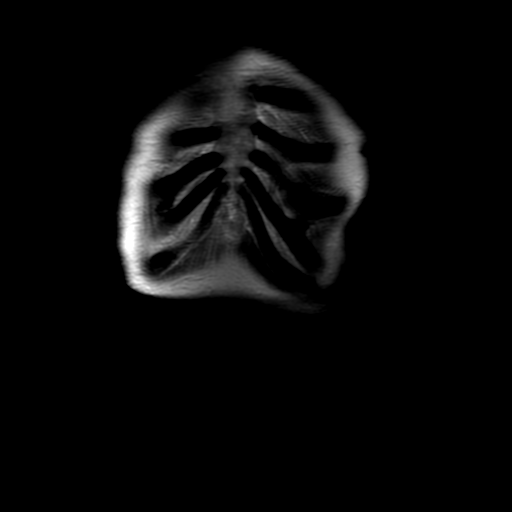

[Series 4: ax ssfse nav · axial · 6.0mm · 0.74mm/px · 1 of 34 slices shown]
[im 1/34]
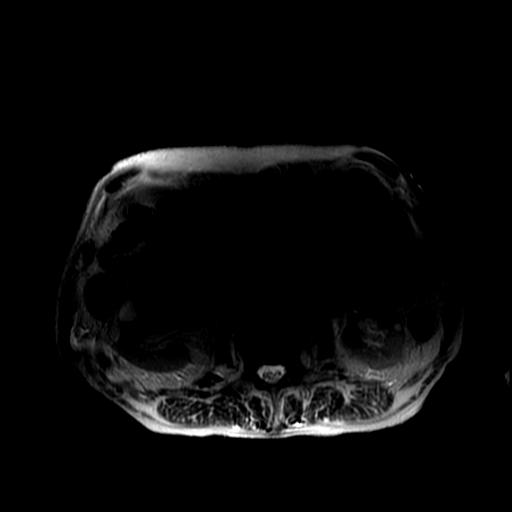

[Series 5: T2 fat-sat · axial · 6.0mm · 0.74mm/px · 1 of 34 slices shown]
[im 1/34]
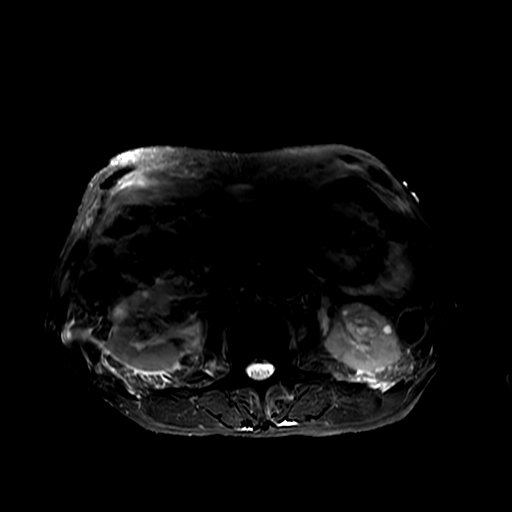

[Series 6: DWI b500 · axial · 8.0mm · 1.48mm/px · 1 of 48 slices shown]
[im 1/48]
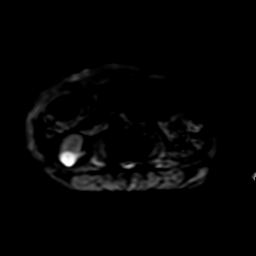

[Series 8: T1 dynamic · axial · 4.9mm · 0.82mm/px · z∈[-88,+137]mm · 2 of 92 slices shown (1 of 4)]
[im 1/92]
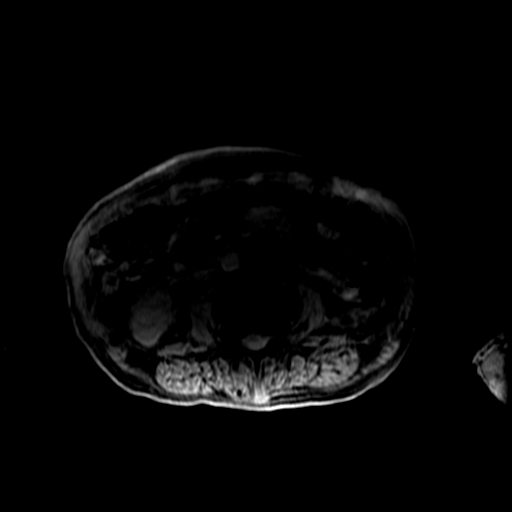
[im 92/92]
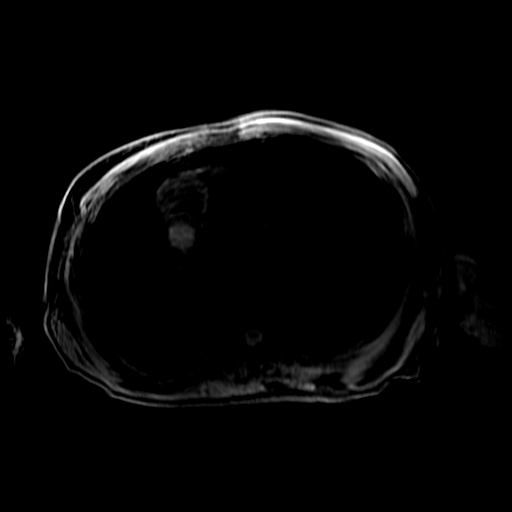

[Series 9: radial 2d thick · coronal · 40.0mm · 0.86mm/px · 1 of 6 slices shown]
[im 1/6]
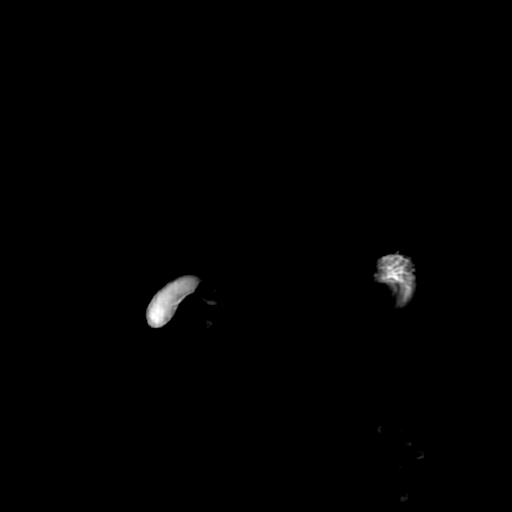

[Series 10: bSSFP · coronal · 6.0mm · 0.78mm/px · 1 of 35 slices shown]
[im 1/35]
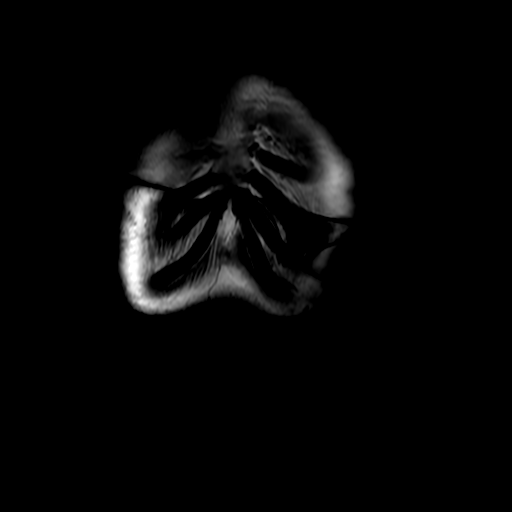

[Series 12: T1 dynamic · coronal · 3.4mm · 1.56mm/px · 3 of 120 slices shown (2 of 4)]
[im 1/120]
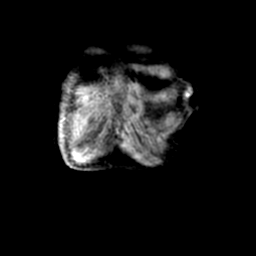
[im 60/120]
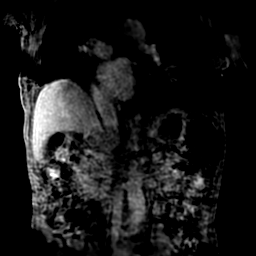
[im 120/120]
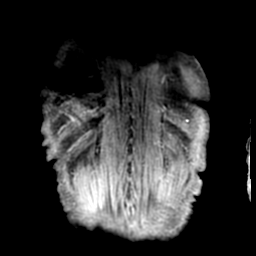

[Series 650: ADC · axial · 8.0mm · 1.48mm/px · 1 of 24 slices shown]
[im 1/24]
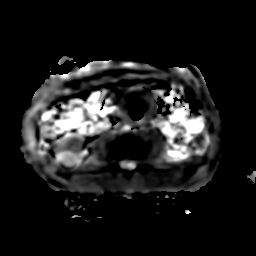

[Series 750: projection images · oblique · 1.2mm · 0.30mm/px · 1 of 11 slices shown]
[im 1/11]
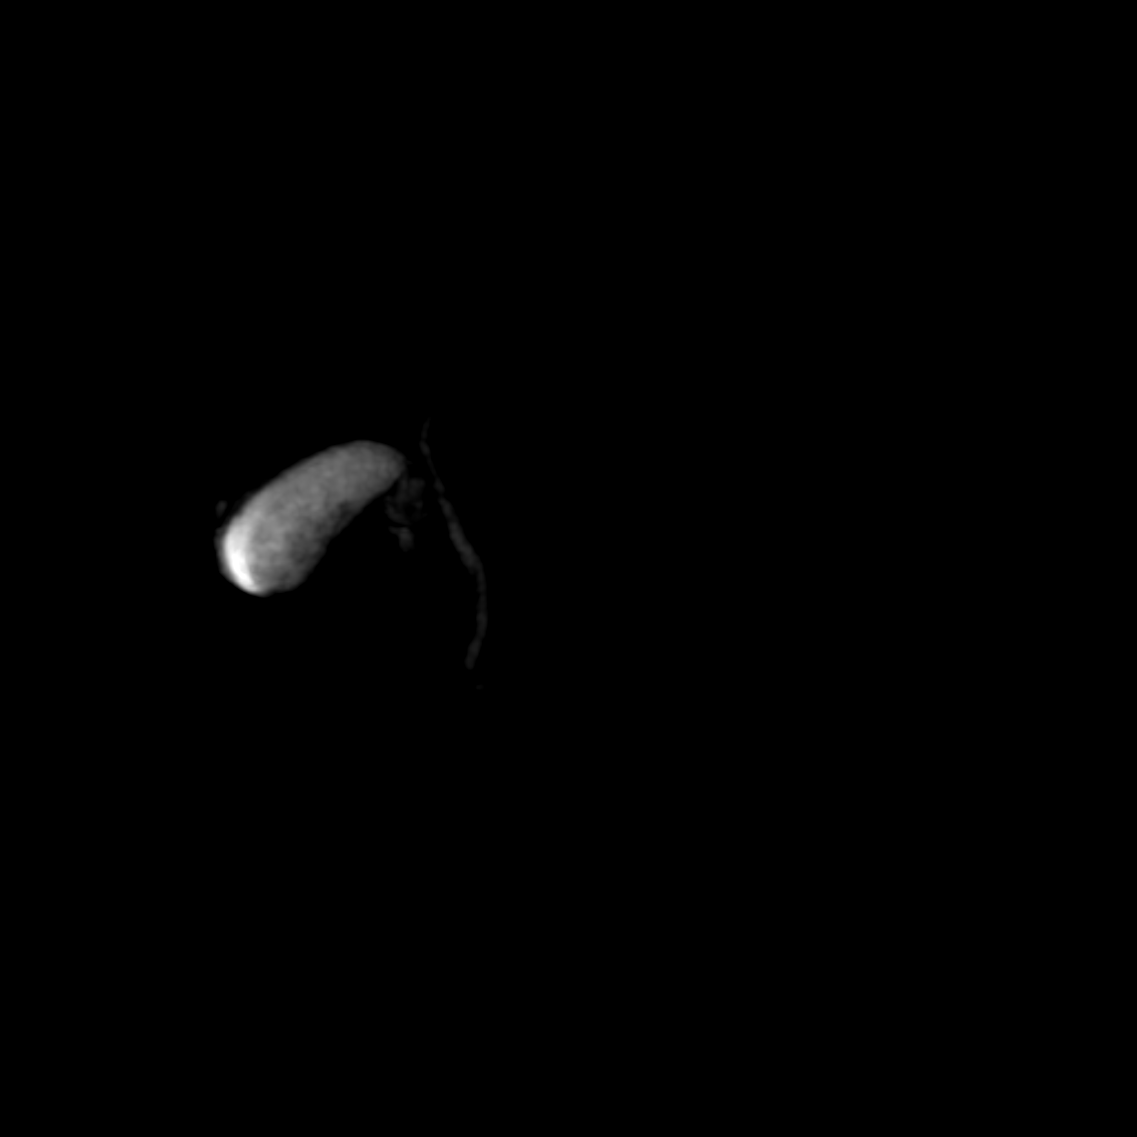

[Series 801: T1 dynamic · axial · 4.9mm · 0.82mm/px · z∈[-88,+137]mm · 2 of 92 slices shown (3 of 4)]
[im 1/92]
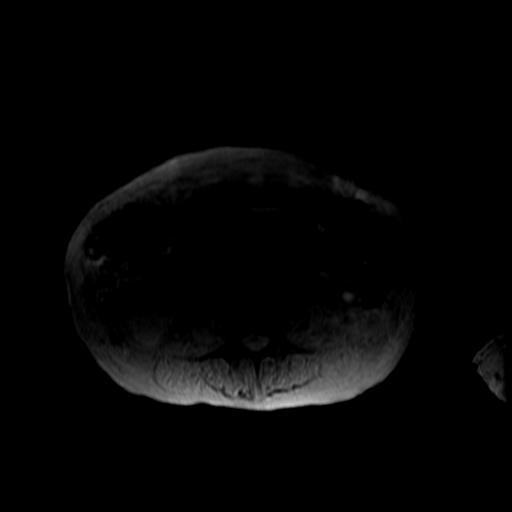
[im 92/92]
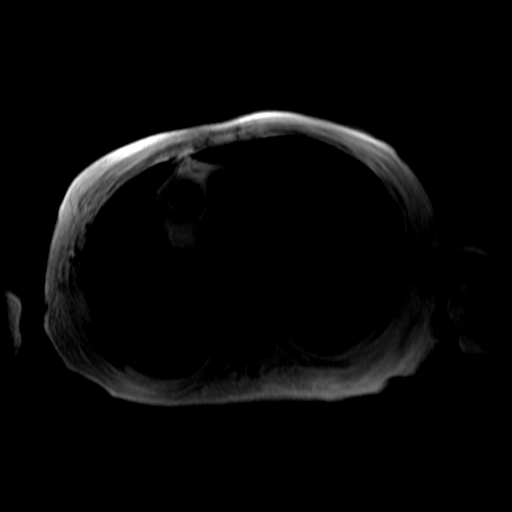

[Series 802: T1 dynamic · axial · 4.9mm · 0.82mm/px · z∈[-88,+137]mm · 2 of 92 slices shown (4 of 4)]
[im 1/92]
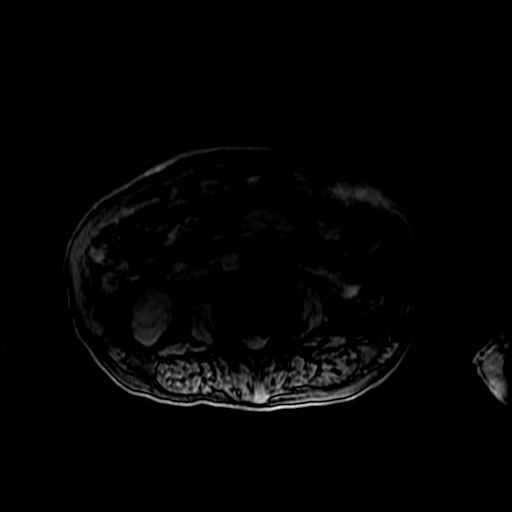
[im 92/92]
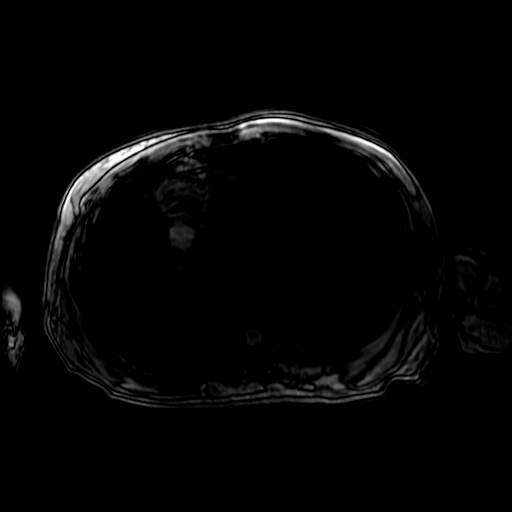

[17 of 48 positions shown; findings below may reference images not displayed]

FINDINGS: Comment: Today's study is limited by large amount of patient motion
artifact.

Lower chest: Unremarkable.

Hepatobiliary: No discrete cystic or solid hepatic lesions are
confidently identified on today's motion limited examination. No
intra or extrahepatic biliary ductal dilatation noted on MRCP
images. Common bile duct measures 3-4 mm in the porta hepatis. No
filling defects in the common bile duct to suggest
choledocholithiasis. Filling defects are present in the gallbladder,
compatible with cholelithiasis. Gallbladder is not distended. No
gallbladder wall thickening or pericholecystic fluid or inflammatory
changes.

Pancreas: No discrete pancreatic mass is confidently identified in
the head of the pancreas or uncinate process. Throughout this region
there are multiple tiny cystic areas largest of which measure only 6
mm and what appears to be side branch ectasia, without a
well-defined larger cystic mass. No pancreatic ductal dilatation
noted on MRCP images. No peripancreatic fluid collections or
inflammatory changes.

Spleen:  Unremarkable.

Adrenals/Urinary Tract: Multiple T1 hypointense, T2 hyperintense,
nonenhancing lesions are noted in both kidneys, compatible with
simple cysts, largest of which is in the medial aspect of the upper
pole of the right kidney measuring 5.1 x 4.6 cm. In addition, in the
posterior aspect of the upper pole the left kidney there is a 2.4 x
1.9 cm T1 hyperintense, T2 hypointense, nonenhancing lesion,
compatible with a proteinaceous/hemorrhagic cyst. No
hydroureteronephrosis in the visualized portions of the abdomen.
Bilateral adrenal glands are normal in appearance.

Stomach/Bowel: Visualized portions are unremarkable.

Vascular/Lymphatic: No known infrarenal abdominal aortic aneurysm is
only partially imaged, better demonstrated on yesterday's CT the
abdomen and pelvis. Likewise, the filling defect in the proximal
portal vein is evident on today's study, but better demonstrated on
yesterday's CT examination. No lymphadenopathy noted in the abdomen.

Other: No significant volume of ascites noted in the visualized
portions of the peritoneal cavity.

Musculoskeletal: No aggressive appearing osseous lesions are noted
in the visualized portions of the skeleton.
IMPRESSION: 1. No definite pancreatic mass confidently identified. There are
tiny cystic lesions in the head of the pancreas, which likely
reflect areas of side branch ectasia and/or multiple small side
branch IPMNs (intraductal papillary mucinous neoplasms). Recommend
follow up pre and post contrast MRI/MRCP or pancreatic protocol CT
in 2 years. This recommendation follows ACR consensus guidelines:
Management of Incidental Pancreatic Cysts: A White Paper of the ACR
Incidental Findings Committee. [HOSPITAL] [id number];[DATE].
2. Multiple Bosniak class 1 and Bosniak class 2 cysts in the kidneys
bilaterally, as above.
3. Nonocclusive portal vein thrombus again noted, but better
demonstrated on yesterday's CT examination.
4. Infrarenal abdominal aortic aneurysm better demonstrated on
yesterday's CT examination which measured up to 5.3 x 5.2 cm in
diameter.

## 2023-04-02 ENCOUNTER — Other Ambulatory Visit: Payer: Self-pay | Admitting: Student in an Organized Health Care Education/Training Program

## 2023-04-03 ENCOUNTER — Other Ambulatory Visit: Payer: Self-pay

## 2023-04-03 MED ORDER — ROSUVASTATIN CALCIUM 40 MG PO TABS: 40.0000 mg | ORAL_TABLET | Freq: Every day | ORAL | 3 refills | Status: AC

## 2023-04-03 MED ORDER — AMLODIPINE BESYLATE 5 MG PO TABS: 5.0000 mg | ORAL_TABLET | Freq: Every day | ORAL | 3 refills | Status: DC

## 2023-04-10 ENCOUNTER — Other Ambulatory Visit: Payer: Self-pay

## 2023-04-10 DIAGNOSIS — Z79891 Long term (current) use of opiate analgesic: Secondary | ICD-10-CM

## 2023-04-10 DIAGNOSIS — M25551 Pain in right hip: Secondary | ICD-10-CM

## 2023-04-10 MED ORDER — HYDROCODONE-ACETAMINOPHEN 7.5-325 MG PO TABS
1.0000 | ORAL_TABLET | Freq: Two times a day (BID) | ORAL | 0 refills | Status: DC | PRN
Start: 2023-04-10 — End: 2023-04-28

## 2023-04-14 ENCOUNTER — Ambulatory Visit (HOSPITAL_COMMUNITY)
Admission: RE | Admit: 2023-04-14 | Discharge: 2023-04-14 | Disposition: A | Discharge status: Home / Self Care | Payer: 59 | Source: Ambulatory Visit | Attending: Surgery | Admitting: Surgery

## 2023-04-14 ENCOUNTER — Ambulatory Visit (INDEPENDENT_AMBULATORY_CARE_PROVIDER_SITE_OTHER): Payer: 59 | Admitting: Surgery

## 2023-04-14 ENCOUNTER — Ambulatory Visit (INDEPENDENT_AMBULATORY_CARE_PROVIDER_SITE_OTHER)
Admission: RE | Admit: 2023-04-14 | Discharge: 2023-04-14 | Disposition: A | Discharge status: Home / Self Care | Payer: 59 | Source: Ambulatory Visit | Attending: Surgery | Admitting: Surgery

## 2023-04-14 ENCOUNTER — Encounter: Payer: Self-pay | Admitting: Surgery

## 2023-04-14 VITALS — BP 169/100 | HR 58 | Temp 97.9°F | Resp 20 | Ht 73.0 in | Wt 136.0 lb

## 2023-04-14 DIAGNOSIS — I714 Abdominal aortic aneurysm, without rupture, unspecified: Secondary | ICD-10-CM

## 2023-04-14 DIAGNOSIS — I7142 Juxtarenal abdominal aortic aneurysm, without rupture: Secondary | ICD-10-CM | POA: Insufficient documentation

## 2023-04-14 MED ORDER — ASPIRIN 81 MG PO TBEC: 81.0000 mg | DELAYED_RELEASE_TABLET | Freq: Every day | ORAL | 12 refills | Status: AC

## 2023-04-14 NOTE — Progress Notes (Signed)
[facility] [place]  Patient name: [name] MRN: [id number] DOB: January 23, 1946 Sex: male   REASON FOR VISIT:    Follow up  HISOTRY OF PRESENT ILLNESS:   [name] is a 77 y.o. male who underwent a fenestrated juxtarenal abdominal aortic aneurysm repair with stenting of his left and right renal arteries on 08/16/2022.  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  This postoperative CT scan shows that his graft is in good position with no evidence of endoleak.     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  He has a suprapubic catheter in place.  He suffers from protein calorie malnutrition as well as weight loss.  He has a history of coronary artery disease, status post PCI.   PAST MEDICAL HISTORY:   Past Medical History:  Diagnosis Date   AAA (abdominal aortic aneurysm) (HCC)    followed by vasc surgery   Alcohol abuse    Barretts esophagus    bx distal esophagus 2011 neg    Bladder outlet obstruction 04/02/2018   CAD in native artery    a. s/p stent to prox RCA and mid Cx.   Cataract    Cholelithiasis    Chronic osteomyelitis involving ankle and foot, right (HCC) 07/13/2018   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   2006 - 3 adenomas largest 12 mm  2008 - no polyps 2013-  2 mm cecal polyp, adenoma, routine repeat colonoscopy 02/2017 approx 05/14/2017 18 polyps max 12 mm ALL adenomas recall 2019 Iva Boop, MD, [facility]      Dieulafoy lesion of duodenum    Diverticulosis of colon    DJD (degenerative joint disease)    left hip   Full dentures    upper and lower   GERD (gastroesophageal reflux disease)    diet controlled    Gout    feet   Hiatal hernia    2 cm noted on upper endos 2011    Hx of adenomatous colonic polyps    Hyperlipidemia    Hypertension    Internal hemorrhoid    Lung cancer (HCC)    squamous cell  (T2N0MX), 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 pt was on Plavix x 3 years then transitioned to ASA, no problems since   Non-small cell carcinoma of right lung, stage 1 (HCC) 01/30/2016   Renal failure, acute (HCC)    hx of 2/2 medication, resolved   Tobacco abuse    smoker .5 ppd x 55 yrs   UTI (lower urinary tract infection)      FAMILY HISTORY:   Family History  Problem Relation Age of Onset   Diabetes Mother    Hypertension Sister    Heart disease Sister    Diabetes Sister    Arthritis Brother    Gout Brother    Colon cancer Neg Hx    Esophageal cancer Neg Hx    Rectal cancer Neg Hx    Stomach cancer Neg Hx    Colon polyps Neg Hx    Inflammatory bowel disease Neg Hx    Liver disease Neg Hx    Pancreatic cancer Neg Hx     SOCIAL HISTORY:   Social History   Tobacco Use   Smoking status: Every Day    Current packs/day: 0.75  Average packs/day: 0.8 packs/day for 55.0 years (41.3 ttl pk-yrs)    Types: Cigarettes   Smokeless tobacco: Never   Tobacco comments:    Smokes 8-9 cigarettes per day  Substance Use Topics   Alcohol use: Not Currently     ALLERGIES:   Allergies  Allergen Reactions   Candesartan Cilexetil-Hctz Other (See Comments)    Acute renal failure   Hydrochlorothiazide Other (See Comments)    Acute renal failure   Shellfish Allergy Other (See Comments)    Gout occurs   Betadine [Povidone Iodine] Itching     CURRENT MEDICATIONS:   Current Outpatient Medications  Medication Sig Dispense Refill   albuterol (VENTOLIN HFA) 108 (90 Base) MCG/ACT inhaler INHALE 1-2 PUFFS BY MOUTH EVERY 6 HOURS AS NEEDED FOR WHEEZE OR SHORTNESS OF BREATH 8.5 each 2   amLODipine (NORVASC) 5 MG tablet Take 1 tablet (5 mg total) by mouth daily. 90 tablet 3   aspirin EC 81 MG tablet Take 1 tablet (81 mg total) by mouth daily at 6 (six) AM. Swallow whole. 90 tablet 3   clopidogrel (PLAVIX) 75 MG tablet  TAKE 1 TABLET BY MOUTH EVERY DAY 90 tablet 1   escitalopram (LEXAPRO) 5 MG tablet TAKE 1 TABLET (5 MG TOTAL) BY MOUTH DAILY. 90 tablet 1   HYDROcodone-acetaminophen (NORCO) 7.5-325 MG tablet Take 1 tablet by mouth 2 (two) times daily as needed for moderate pain (pain score 4-6). 60 tablet 0   mirtazapine (REMERON) 7.5 MG tablet Take 2 tablets (15 mg total) by mouth at bedtime. 180 tablet 1   Pancrelipase, Lip-Prot-Amyl, (ZENPEP) 20000-63000 units CPEP Take 3 capsules by mouth 3 (three) times daily before meals. 270 capsule 3   pantoprazole (PROTONIX) 40 MG tablet TAKE 1 TABLET EVERY DAY FOR REFLUX 90 tablet 3   rosuvastatin (CRESTOR) 40 MG tablet Take 1 tablet (40 mg total) by mouth daily. 90 tablet 3   senna (SENOKOT) 8.6 MG TABS tablet Take 2 tablets (17.2 mg total) by mouth in the morning and at bedtime. (Patient taking differently: Take 1 tablet by mouth in the morning and at bedtime.) 120 tablet 1   No current facility-administered medications for this visit.    REVIEW OF SYSTEMS:   [X]  denotes positive finding, [ ]  denotes negative finding Cardiac  Comments:  Chest pain or chest pressure:    Shortness of breath upon exertion:    Short of breath when lying flat:    Irregular heart rhythm:        Vascular    Pain in calf, thigh, or hip brought on by ambulation:    Pain in feet at night that wakes you up from your sleep:     Blood clot in your veins:    Leg swelling:         Pulmonary    Oxygen at home:    Productive cough:     Wheezing:         Neurologic    Sudden weakness in arms or legs:     Sudden numbness in arms or legs:     Sudden onset of difficulty speaking or slurred speech:    Temporary loss of vision in one eye:     Problems with dizziness:         Gastrointestinal    Blood in stool:     Vomited blood:         Genitourinary    Burning when urinating:     Blood in urine:  Psychiatric    Major depression:         Hematologic    Bleeding problems:     Problems with blood clotting too easily:        Skin    Rashes or ulcers:        Constitutional    Fever or chills:      PHYSICAL EXAM:   Vitals:   04/14/23 1027  BP: (!) 169/100  Pulse: (!) 58  Resp: 20  Temp: 97.9 F (36.6 C)  SpO2: 97%  Weight: 136 lb (61.7 kg)  Height: 6\' 1"  (1.854 m)    GENERAL: The patient is a well-nourished male, in no acute distress. The vital signs are documented above. CARDIAC: There is a regular rate and rhythm.  PULMONARY: Non-labored respirations ABDOMEN: Soft and non-tender   MUSCULOSKELETAL: There are no major deformities or cyanosis. NEUROLOGIC: No focal weakness or paresthesias are detected. SKIN: There are no ulcers or rashes noted. PSYCHIATRIC: The patient has a normal affect.  STUDIES:   I have reviewed the following: EVAR: Abdominal Aorta: The largest aortic measurement is 5.4 cm. Patent  endovascular aneurysm repair with evidence of possible type II endoleak.  The largest aortic diameter has decreased compared to prior exam. Previous  diameter measurement was 6.7 cm obtained   on 11/01/2021.    Renal: Right: Cyst(s) noted. Patent renal artery stent.  Left:  Cyst(s) noted. Patent renal artery stent Abnormal size for         the left kidney.  Mesenteric:  70 to 99% stenosis in the celiac artery.  MEDICAL ISSUES:   Juxtarenal abdominal aortic aneurysm: Ultrasound today suggest a possible type II endoleak however this was not seen on his original CT scan.  Regardless there seems to be a significant decrease in the size of his aneurysm now measuring 5.4 cm.  Previously it was 6.7 cm.  I will evaluate this again in 6 months with a CT angiogram.  Unfortunately, he has stopped taking his aspirin.  I would like for him to be on dual antiplatelet therapy given the bilateral renal stents for his repair.     Charlena Cross, MD, FACS [facility] [phone] Pager [phone]

## 2023-04-23 ENCOUNTER — Emergency Department (HOSPITAL_COMMUNITY)
Admission: EM | Admit: 2023-04-23 | Discharge: 2023-04-24 | Disposition: A | Discharge status: Home / Self Care | Payer: 59 | Attending: Emergency Medicine | Admitting: Emergency Medicine

## 2023-04-23 ENCOUNTER — Other Ambulatory Visit: Payer: Self-pay

## 2023-04-23 DIAGNOSIS — Z7982 Long term (current) use of aspirin: Secondary | ICD-10-CM | POA: Diagnosis not present

## 2023-04-23 DIAGNOSIS — R310 Gross hematuria: Secondary | ICD-10-CM | POA: Diagnosis not present

## 2023-04-23 DIAGNOSIS — I714 Abdominal aortic aneurysm, without rupture, unspecified: Secondary | ICD-10-CM | POA: Diagnosis not present

## 2023-04-23 DIAGNOSIS — R319 Hematuria, unspecified: Secondary | ICD-10-CM | POA: Diagnosis not present

## 2023-04-23 DIAGNOSIS — N189 Chronic kidney disease, unspecified: Secondary | ICD-10-CM | POA: Diagnosis not present

## 2023-04-23 DIAGNOSIS — D696 Thrombocytopenia, unspecified: Secondary | ICD-10-CM | POA: Diagnosis not present

## 2023-04-23 DIAGNOSIS — Z7902 Long term (current) use of antithrombotics/antiplatelets: Secondary | ICD-10-CM | POA: Insufficient documentation

## 2023-04-23 DIAGNOSIS — R58 Hemorrhage, not elsewhere classified: Secondary | ICD-10-CM | POA: Diagnosis not present

## 2023-04-23 DIAGNOSIS — K8020 Calculus of gallbladder without cholecystitis without obstruction: Secondary | ICD-10-CM | POA: Diagnosis not present

## 2023-04-23 DIAGNOSIS — Z79899 Other long term (current) drug therapy: Secondary | ICD-10-CM | POA: Insufficient documentation

## 2023-04-23 DIAGNOSIS — N179 Acute kidney failure, unspecified: Secondary | ICD-10-CM | POA: Diagnosis not present

## 2023-04-23 NOTE — ED Triage Notes (Addendum)
Pt has long term suprapubic urinary cath for chronic retention. Today noticed blood in urine that increased over the day. Cathetetr due to be changed on Monday.

## 2023-04-24 ENCOUNTER — Emergency Department (HOSPITAL_COMMUNITY): Payer: 59

## 2023-04-24 ENCOUNTER — Encounter (HOSPITAL_COMMUNITY): Payer: Self-pay

## 2023-04-24 DIAGNOSIS — I714 Abdominal aortic aneurysm, without rupture, unspecified: Secondary | ICD-10-CM | POA: Diagnosis not present

## 2023-04-24 DIAGNOSIS — K8020 Calculus of gallbladder without cholecystitis without obstruction: Secondary | ICD-10-CM | POA: Diagnosis not present

## 2023-04-24 DIAGNOSIS — R310 Gross hematuria: Secondary | ICD-10-CM | POA: Diagnosis not present

## 2023-04-24 LAB — CBC WITH DIFFERENTIAL/PLATELET
Abs Immature Granulocytes: 0.01 10*3/uL (ref 0.00–0.07)
Basophils Absolute: 0 10*3/uL (ref 0.0–0.1)
Basophils Relative: 0 %
Eosinophils Absolute: 0.2 10*3/uL (ref 0.0–0.5)
Eosinophils Relative: 3 %
HCT: 31.8 % — ABNORMAL LOW (ref 39.0–52.0)
Hemoglobin: 9.7 g/dL — ABNORMAL LOW (ref 13.0–17.0)
Immature Granulocytes: 0 %
Lymphocytes Relative: 28 %
Lymphs Abs: 1.3 10*3/uL (ref 0.7–4.0)
MCH: 29.1 pg (ref 26.0–34.0)
MCHC: 30.5 g/dL (ref 30.0–36.0)
MCV: 95.5 fL (ref 80.0–100.0)
Monocytes Absolute: 0.4 10*3/uL (ref 0.1–1.0)
Monocytes Relative: 9 %
Neutro Abs: 2.7 10*3/uL (ref 1.7–7.7)
Neutrophils Relative %: 60 %
Platelets: 86 10*3/uL — ABNORMAL LOW (ref 150–400)
RBC: 3.33 MIL/uL — ABNORMAL LOW (ref 4.22–5.81)
RDW: 16.8 % — ABNORMAL HIGH (ref 11.5–15.5)
WBC: 4.5 10*3/uL (ref 4.0–10.5)
nRBC: 0 % (ref 0.0–0.2)

## 2023-04-24 LAB — BASIC METABOLIC PANEL
Anion gap: 11 (ref 5–15)
BUN: 24 mg/dL — ABNORMAL HIGH (ref 8–23)
CO2: 23 mmol/L (ref 22–32)
Calcium: 8.7 mg/dL — ABNORMAL LOW (ref 8.9–10.3)
Chloride: 106 mmol/L (ref 98–111)
Creatinine, Ser: 1.53 mg/dL — ABNORMAL HIGH (ref 0.61–1.24)
GFR, Estimated: 47 mL/min — ABNORMAL LOW (ref >60)
Glucose, Bld: 92 mg/dL (ref 70–99)
Potassium: 3.9 mmol/L (ref 3.5–5.1)
Sodium: 140 mmol/L (ref 135–145)

## 2023-04-24 LAB — URINALYSIS, W/ REFLEX TO CULTURE (INFECTION SUSPECTED)
RBC / HPF: >50 RBC/hpf (ref 0–5)
WBC, UA: >50 WBC/hpf (ref 0–5)

## 2023-04-24 MED ORDER — CEPHALEXIN 250 MG PO CAPS
500.0000 mg | ORAL_CAPSULE | Freq: Once | ORAL | Status: AC
  Administered 2023-04-24: 500 mg via ORAL
  Filled 2023-04-24: qty 2

## 2023-04-24 MED ORDER — CEPHALEXIN 500 MG PO CAPS: 500.0000 mg | ORAL_CAPSULE | Freq: Two times a day (BID) | ORAL | 0 refills | Status: DC

## 2023-04-24 NOTE — ED Provider Notes (Signed)
[place] [facility]GENC[facility] DEPARTMENT AT [facility] Provider Note   CSN: [id number] Arrival date & time: 04/23/23  2344     History  Chief Complaint  Patient presents with   Hematuria    [name] is a 77 y.o. male.  The history is provided by the patient and medical records.  Hematuria  [name] is a 77 y.o. male who presents to the Emergency Department complaining of hematuria.  She presents the emergency department for evaluation of bleeding from his catheter site that started today.  He does get his catheter changed every 30 days and his last exchange was on October 10.  No associated pain.  No fever, nausea.  No prior similar symptoms.  No reported injuries.  He does have a history of CKD, aortic aneurysm endograft, takes aspirin and Plavix.  No drainage around the catheter.      Home Medications Prior to Admission medications   Medication Sig Start Date End Date Taking? Authorizing Provider  cephALEXin (KEFLEX) 500 MG capsule Take 1 capsule (500 mg total) by mouth 2 (two) times daily. 04/24/23  Yes Tilden Fossa, MD  albuterol (VENTOLIN HFA) 108 (90 Base) MCG/ACT inhaler INHALE 1-2 PUFFS BY MOUTH EVERY 6 HOURS AS NEEDED FOR WHEEZE OR SHORTNESS OF BREATH 12/09/22   Tyson Alias, MD  amLODipine (NORVASC) 5 MG tablet Take 1 tablet (5 mg total) by mouth daily. 04/03/23 04/02/24  Tyson Alias, MD  aspirin EC 81 MG tablet Take 1 tablet (81 mg total) by mouth daily. Swallow whole. 04/14/23   Nada Libman, MD  clopidogrel (PLAVIX) 75 MG tablet TAKE 1 TABLET BY MOUTH EVERY DAY 02/14/23   Tyson Alias, MD  escitalopram (LEXAPRO) 5 MG tablet TAKE 1 TABLET (5 MG TOTAL) BY MOUTH DAILY. 12/10/22 06/08/23  Tyson Alias, MD  HYDROcodone-acetaminophen (NORCO) 7.5-325 MG tablet Take 1 tablet by mouth 2 (two) times daily as needed for moderate pain (pain score 4-6). 04/10/23   Tyson Alias, MD  mirtazapine (REMERON) 7.5 MG  tablet Take 2 tablets (15 mg total) by mouth at bedtime. 10/07/22   Tyson Alias, MD  Pancrelipase, Lip-Prot-Amyl, (ZENPEP) 20000-63000 units CPEP Take 3 capsules by mouth 3 (three) times daily before meals. 02/14/23   Tyson Alias, MD  pantoprazole (PROTONIX) 40 MG tablet TAKE 1 TABLET EVERY DAY FOR REFLUX 10/21/22   Tyson Alias, MD  rosuvastatin (CRESTOR) 40 MG tablet Take 1 tablet (40 mg total) by mouth daily. 04/03/23   Tyson Alias, MD  senna (SENOKOT) 8.6 MG TABS tablet Take 2 tablets (17.2 mg total) by mouth in the morning and at bedtime. Patient taking differently: Take 1 tablet by mouth in the morning and at bedtime. 06/29/21   Tyson Alias, MD      Allergies    Candesartan cilexetil-hctz, Hydrochlorothiazide, Shellfish allergy, and Betadine [povidone iodine]    Review of Systems   Review of Systems  Genitourinary:  Positive for hematuria.  All other systems reviewed and are negative.   Physical Exam Updated Vital Signs BP 138/79 (BP Location: Right Arm)   Pulse 68   Temp 98 F (36.7 C) (Oral)   Resp 16   Ht 6' (1.829 m)   Wt 60.8 kg   SpO2 99%   BMI 18.17 kg/m  Physical Exam Vitals and nursing note reviewed.  Constitutional:      Appearance: He is well-developed.  HENT:     Head: Normocephalic and  atraumatic.  Cardiovascular:     Rate and Rhythm: Normal rate and regular rhythm.     Heart sounds: No murmur heard. Pulmonary:     Effort: Pulmonary effort is normal. No respiratory distress.     Breath sounds: Normal breath sounds.  Abdominal:     Palpations: Abdomen is soft.     Tenderness: There is no abdominal tenderness. There is no guarding or rebound.     Comments: 41 French suprapubic catheter in place without any surrounding erythema, tenderness or drainage.  Musculoskeletal:        General: No tenderness.  Skin:    General: Skin is warm and dry.  Neurological:     Mental Status: He is alert and oriented to  person, place, and time.  Psychiatric:        Behavior: Behavior normal.     ED Results / Procedures / Treatments   Labs (all labs ordered are listed, but only abnormal results are displayed) Labs Reviewed  BASIC METABOLIC PANEL - Abnormal; Notable for the following components:      Result Value   BUN 24 (*)    Creatinine, Ser 1.53 (*)    Calcium 8.7 (*)    GFR, Estimated 47 (*)    All other components within normal limits  CBC WITH DIFFERENTIAL/PLATELET - Abnormal; Notable for the following components:   RBC 3.33 (*)    Hemoglobin 9.7 (*)    HCT 31.8 (*)    RDW 16.8 (*)    Platelets 86 (*)    All other components within normal limits  URINALYSIS, W/ REFLEX TO CULTURE (INFECTION SUSPECTED) - Abnormal; Notable for the following components:   Color, Urine BROWN (*)    APPearance TURBID (*)    Glucose, UA   (*)    Value: TEST NOT REPORTED DUE TO COLOR INTERFERENCE OF URINE PIGMENT   Hgb urine dipstick   (*)    Value: TEST NOT REPORTED DUE TO COLOR INTERFERENCE OF URINE PIGMENT   Bilirubin Urine   (*)    Value: TEST NOT REPORTED DUE TO COLOR INTERFERENCE OF URINE PIGMENT   Ketones, ur   (*)    Value: TEST NOT REPORTED DUE TO COLOR INTERFERENCE OF URINE PIGMENT   Protein, ur   (*)    Value: TEST NOT REPORTED DUE TO COLOR INTERFERENCE OF URINE PIGMENT   Nitrite   (*)    Value: TEST NOT REPORTED DUE TO COLOR INTERFERENCE OF URINE PIGMENT   Leukocytes,Ua   (*)    Value: TEST NOT REPORTED DUE TO COLOR INTERFERENCE OF URINE PIGMENT   Bacteria, UA MANY (*)    All other components within normal limits  URINE CULTURE    EKG None  Radiology CT Renal Stone Study  Result Date: 04/24/2023 CLINICAL DATA:  Hematuria. EXAM: CT ABDOMEN AND PELVIS WITHOUT CONTRAST TECHNIQUE: Multidetector CT imaging of the abdomen and pelvis was performed following the standard protocol without IV contrast. RADIATION DOSE REDUCTION: This exam was performed according to the departmental dose-optimization  program which includes automated exposure control, adjustment of the mA and/or kV according to patient size and/or use of iterative reconstruction technique. COMPARISON:  09/25/2022. FINDINGS: Lower chest: Multi-vessel coronary artery calcifications are noted. Emphysematous changes with atelectasis or scarring are noted at the lung bases. Hepatobiliary: No focal liver abnormality is seen. Stone is present within the gallbladder. No biliary ductal dilatation is seen. Pancreas: Unremarkable. No pancreatic ductal dilatation or surrounding inflammatory changes. Spleen: Normal in size without  focal abnormality. Adrenals/Urinary Tract: The adrenal glands are within normal limits. Hypodensities are noted in the kidneys bilaterally, previously characterized as cysts. Renal calculi are present bilaterally. No hydronephrosis is seen bilaterally. Stable phleboliths and vascular calcifications are noted in the pelvis. Evaluation of the mid to distal ureters is limited due to paucity of intra-abdominal fat. A small amount of hyperdense material is noted in the posterior bladder on the right. A suprapubic catheter is present in the urinary bladder. Stomach/Bowel: Stomach is within normal limits. Appendix is not seen due to paucity of intra-abdominal fat. No evidence of bowel wall thickening, distention, or inflammatory changes. No free air or pneumatosis is seen. A few scattered diverticula are seen along the colon without evidence of diverticulitis. Vascular/Lymphatic: Aortic atherosclerosis. There is aneurysmal dilatation of the abdominal aorta with an endovascular graft in place. The aneurysmal sac measures up to 4.9 x 5.6 cm. Bilateral renal artery stents and common iliac artery stents are noted. No abdominal or pelvic lymphadenopathy. Reproductive: Prostate gland is enlarged. Other: No abdominopelvic ascites. Musculoskeletal: Degenerative changes are noted in the thoracolumbar spine. No acute osseous abnormality is seen.  IMPRESSION: 1. Suprapubic catheter in place. Small amount of hyperdense material in the urinary bladder on the right, possible blood products. 2. Bilateral nephrolithiasis without evidence of obstructive uropathy. 3. Enlarged prostate gland. 4. Cholelithiasis. 5. Emphysema. 6. Aortic atherosclerosis with aneurysmal dilatation of the abdominal aorta with endograft in place. Aneurysmal sac has decreased in size from the previous exam. 7. Coronary artery calcifications. Electronically Signed   By: Thornell Sartorius M.D.   On: 04/24/2023 01:59    Procedures Procedures    Medications Ordered in ED Medications  cephALEXin (KEFLEX) capsule 500 mg (500 mg Oral Given 04/24/23 0241)    ED Course/ Medical Decision Making/ A&P                                 Medical Decision Making Amount and/or Complexity of Data Reviewed Labs: ordered. Radiology: ordered.  Risk Prescription drug management.   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  Catheter is draining appropriately, does have dark blood in the catheter.  Catheter flushed in the emergency department.  UA is concerning for UTI or although difficult to interpret in setting of chronic catheter.  Labs with mild elevation in creatinine compared to baseline.  Hemoglobin stable, he does have worsening thrombocytopenia.  Will send a culture and start on antibiotics given this new change.  CT scan without evidence of obstructing stone.  Possible small amount of clot in the bladder, irrigation performed after CT scan.  Will start on antibiotics with close urology follow-up and PCP follow-up.  He does have appointment scheduled soon.  Discussed return precautions for progressive or new concerning symptoms.       Final Clinical Impression(s) / ED Diagnoses Final diagnoses:  Gross hematuria  Thrombocytopenia (HCC)  AKI (acute kidney injury) (HCC)    Rx / DC Orders ED Discharge  Orders          Ordered    cephALEXin (KEFLEX) 500 MG capsule  2 times daily        04/24/23 0233              Tilden Fossa, MD 04/24/23 0408

## 2023-04-24 NOTE — ED Notes (Signed)
Pt catheter flushed with water. Urine running clear after flush.

## 2023-04-28 ENCOUNTER — Encounter: Payer: Self-pay | Admitting: Student in an Organized Health Care Education/Training Program

## 2023-04-28 ENCOUNTER — Ambulatory Visit: Payer: 59 | Admitting: Student in an Organized Health Care Education/Training Program

## 2023-04-28 VITALS — BP 147/76 | HR 60 | Temp 97.8°F | Ht 72.0 in | Wt 138.9 lb

## 2023-04-28 DIAGNOSIS — Z79891 Long term (current) use of opiate analgesic: Secondary | ICD-10-CM | POA: Diagnosis not present

## 2023-04-28 DIAGNOSIS — I10 Essential (primary) hypertension: Secondary | ICD-10-CM

## 2023-04-28 DIAGNOSIS — R339 Retention of urine, unspecified: Secondary | ICD-10-CM | POA: Diagnosis not present

## 2023-04-28 DIAGNOSIS — F17210 Nicotine dependence, cigarettes, uncomplicated: Secondary | ICD-10-CM

## 2023-04-28 DIAGNOSIS — I7142 Juxtarenal abdominal aortic aneurysm, without rupture: Secondary | ICD-10-CM

## 2023-04-28 DIAGNOSIS — M25551 Pain in right hip: Secondary | ICD-10-CM | POA: Diagnosis not present

## 2023-04-28 DIAGNOSIS — M25552 Pain in left hip: Secondary | ICD-10-CM

## 2023-04-28 DIAGNOSIS — E43 Unspecified severe protein-calorie malnutrition: Secondary | ICD-10-CM

## 2023-04-28 DIAGNOSIS — F17200 Nicotine dependence, unspecified, uncomplicated: Secondary | ICD-10-CM

## 2023-04-28 DIAGNOSIS — I719 Aortic aneurysm of unspecified site, without rupture: Secondary | ICD-10-CM

## 2023-04-28 MED ORDER — LOSARTAN POTASSIUM 25 MG PO TABS: 50.0000 mg | ORAL_TABLET | Freq: Every day | ORAL | 1 refills | Status: DC

## 2023-04-28 MED ORDER — HYDROCODONE-ACETAMINOPHEN 7.5-325 MG PO TABS
1.0000 | ORAL_TABLET | Freq: Two times a day (BID) | ORAL | 0 refills | Status: DC | PRN
Start: 2023-04-28 — End: 2023-06-09

## 2023-04-28 NOTE — Assessment & Plan Note (Signed)
Blood pressure is elevated today.  Hypertension has been worsening as he has been gaining weight back.  Tolerated amlodipine 5 mg well.  Will add losartan 25 mg daily.  I noted in the medical record that he had a history of acute kidney injury with candesartan and HCTZ.  I think it will be okay to start a low-dose of an ARB.  Recommended good hydration, starting at a low dose, discontinue if he has lightheadedness, follow-up with me in 4 weeks for blood pressure recheck and labs.

## 2023-04-28 NOTE — Assessment & Plan Note (Signed)
Chronic and stable.  Doing well with Norco 7.5 mg which he uses twice daily.  He reports this is improved his functional status.  No recent falls.  No other adverse side effects.  I reviewed the database which shows appropriate dispensing.  Will refill Norco and follow-up with him in 4 weeks.

## 2023-04-28 NOTE — Assessment & Plan Note (Signed)
Managed at urology, now has a suprapubic catheter.  1 ED visit for hematuria which has resolved.  No symptoms of cystitis currently.  Treated with cephalexin for urinary tract infection.  Going to urology later today for catheter exchange.  Because symptoms and hematuria have all resolved, I recommended that he discontinue the antibiotics after the catheter is changed later today.

## 2023-04-28 NOTE — Assessment & Plan Note (Signed)
Continues to smoke 6 or 7 cigarettes/day.  He is contemplative about quitting.  We offered resources.

## 2023-04-28 NOTE — Patient Instructions (Addendum)
Thank you, [name] for allowing [place] to provide your care today. Glad to hear you're doing so well!   We are adding one medication in order to keep your blood pressure from going too high. Please pick up your prescription for losartan 50 mg and make sure to take 2 tablets every day.   Otherwise, please continue to eat well and take your medications as you have been doing and let us know if there are any changes. We will see you in about a month to recheck your blood pressure on this new medication.    I have ordered the following medication/changed the following medications:   Start the following medications: Meds ordered this encounter  Medications   losartan (COZAAR) 25 MG tablet    Sig: Take 2 tablets (50 mg total) by mouth daily.    Dispense:  30 tablet    Refill:  1     Follow up:  4 weeks for a blood pressure check.  Thank you for trusting [place] with your care. Wishing you the best!   Thea Alken, MS3

## 2023-04-28 NOTE — Progress Notes (Signed)
Established Patient Office Visit  Subjective   Patient ID: POWER ARNET, male    DOB: 07-11-1945  Age: 77 y.o. MRN: [id number]   HPI  77 year old person here for management of hypertension.  Doing well since I last saw him.  Good adherence with medications.  Denies any side effects.  Had 1 ED visit for hematuria, was treated for infectious cystitis with cephalexin.  Tolerated that medication well.  Hematuria has resolved.  He is doing well with the suprapubic catheter and the leg bag.  Emptying himself.  Eating well, no nausea or vomiting.  Ambulating well, functional, lives independently, independent in his activities of daily living.  No recent falls.    Objective:     BP (!) 181/95 (BP Location: Right Arm, Patient Position: Sitting, Cuff Size: Small) Comment: patient just took medications  Pulse 82   Temp 97.8 F (36.6 C)   Ht 6' (1.829 m)   Wt 138 lb 14.4 oz (63 kg)   SpO2 95%   BMI 18.84 kg/m    Physical Exam   Gen: Well-appearing man, no distress Neck: Normal thyroid, no lymphadenopathy Heart: Regular rate and rhythm with no murmurs Lungs: Unlabored, clear to auscultation throughout Extremities: Warm and well-perfused, no lower extremity edema Neuro: Alert, conversational, full strength upper and lower extremities, normal get up and go test, ambulating using a cane    Assessment & Plan:   Problem List Items Addressed This Visit       High   Tobacco use disorder (Chronic)    Continues to smoke 6 or 7 cigarettes/day.  He is contemplative about quitting.  We offered resources.      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  Doing well with Norco 7.5 mg which he uses twice daily.  He reports this is improved his functional status.  No recent falls.  No other adverse side effects.  I reviewed the database which shows appropriate dispensing.  Will refill Norco and follow-up with him in 4 weeks.      Relevant Medications    HYDROcodone-acetaminophen (NORCO) 7.5-325 MG tablet   Protein-calorie malnutrition, severe (HCC) (Chronic)    Improving.  Weight is up today at 138 pounds with a BMI of almost 19.  We are treating chronic pancreatic insufficiency with Zenpep tablets that he is tolerating well.  Good access.  This problem is really improved ever since he had surgical management of the abdominal aortic aneurysm and definitive management of the bladder outlet obstruction.        Medium    Essential hypertension - Primary (Chronic)    Blood pressure is elevated today.  Hypertension has been worsening as he has been gaining weight back.  Tolerated amlodipine 5 mg well.  Will add losartan 25 mg daily.  I noted in the medical record that he had a history of acute kidney injury with candesartan and HCTZ.  I think it will be okay to start a low-dose of an ARB.  Recommended good hydration, starting at a low dose, discontinue if he has lightheadedness, follow-up with me in 4 weeks for blood pressure recheck and labs.      Relevant Medications   losartan (COZAAR) 25 MG tablet   AAA (abdominal aortic aneurysm) (HCC) (Chronic)    Managed with vascular surgery.  No symptoms currently.  Continuing medical management with aspirin, Plavix, and rosuvastatin.  Also working on better blood pressure control.  Relevant Medications   losartan (COZAAR) 25 MG tablet   Urinary retention (Chronic)    Managed at urology, now has a suprapubic catheter.  1 ED visit for hematuria which has resolved.  No symptoms of cystitis currently.  Treated with cephalexin for urinary tract infection.  Going to urology later today for catheter exchange.  Because symptoms and hematuria have all resolved, I recommended that he discontinue the antibiotics after the catheter is changed later today.      Other Visit Diagnoses     Bilateral hip pain       Relevant Medications   HYDROcodone-acetaminophen (NORCO) 7.5-325 MG tablet       Return in  about 4 weeks (around 05/26/2023).    Tyson Alias, MD

## 2023-04-28 NOTE — Assessment & Plan Note (Signed)
Managed with vascular surgery.  No symptoms currently.  Continuing medical management with aspirin, Plavix, and rosuvastatin.  Also working on better blood pressure control.

## 2023-04-28 NOTE — Assessment & Plan Note (Signed)
Improving.  Weight is up today at 138 pounds with a BMI of almost 19.  We are treating chronic pancreatic insufficiency with Zenpep tablets that he is tolerating well.  Good access.  This problem is really improved ever since he had surgical management of the abdominal aortic aneurysm and definitive management of the bladder outlet obstruction.

## 2023-04-30 LAB — URINE CULTURE: Culture: >=100000 — AB

## 2023-05-05 ENCOUNTER — Other Ambulatory Visit: Payer: Self-pay | Admitting: Student in an Organized Health Care Education/Training Program

## 2023-05-05 DIAGNOSIS — I10 Essential (primary) hypertension: Secondary | ICD-10-CM

## 2023-05-28 DIAGNOSIS — R338 Other retention of urine: Secondary | ICD-10-CM | POA: Diagnosis not present

## 2023-06-01 ENCOUNTER — Encounter (HOSPITAL_COMMUNITY): Payer: Self-pay | Admitting: Emergency Medicine

## 2023-06-01 ENCOUNTER — Inpatient Hospital Stay (HOSPITAL_COMMUNITY)
Admission: EM | Admit: 2023-06-01 | Discharge: 2023-06-03 | DRG: 280 | Disposition: A | Discharge status: Home / Self Care | Payer: 59 | Attending: Internal Medicine | Admitting: Internal Medicine

## 2023-06-01 ENCOUNTER — Other Ambulatory Visit: Payer: Self-pay

## 2023-06-01 ENCOUNTER — Emergency Department (HOSPITAL_COMMUNITY): Payer: 59

## 2023-06-01 DIAGNOSIS — J9601 Acute respiratory failure with hypoxia: Secondary | ICD-10-CM | POA: Diagnosis present

## 2023-06-01 DIAGNOSIS — I509 Heart failure, unspecified: Secondary | ICD-10-CM | POA: Insufficient documentation

## 2023-06-01 DIAGNOSIS — I50811 Acute right heart failure: Secondary | ICD-10-CM | POA: Diagnosis not present

## 2023-06-01 DIAGNOSIS — F17210 Nicotine dependence, cigarettes, uncomplicated: Secondary | ICD-10-CM | POA: Diagnosis not present

## 2023-06-01 DIAGNOSIS — J439 Emphysema, unspecified: Secondary | ICD-10-CM | POA: Diagnosis not present

## 2023-06-01 DIAGNOSIS — K219 Gastro-esophageal reflux disease without esophagitis: Secondary | ICD-10-CM | POA: Diagnosis present

## 2023-06-01 DIAGNOSIS — I5082 Biventricular heart failure: Secondary | ICD-10-CM | POA: Diagnosis present

## 2023-06-01 DIAGNOSIS — R7989 Other specified abnormal findings of blood chemistry: Secondary | ICD-10-CM | POA: Diagnosis not present

## 2023-06-01 DIAGNOSIS — I4510 Unspecified right bundle-branch block: Secondary | ICD-10-CM | POA: Diagnosis not present

## 2023-06-01 DIAGNOSIS — K8689 Other specified diseases of pancreas: Secondary | ICD-10-CM | POA: Diagnosis present

## 2023-06-01 DIAGNOSIS — Z955 Presence of coronary angioplasty implant and graft: Secondary | ICD-10-CM

## 2023-06-01 DIAGNOSIS — J4489 Other specified chronic obstructive pulmonary disease: Secondary | ICD-10-CM | POA: Diagnosis present

## 2023-06-01 DIAGNOSIS — R339 Retention of urine, unspecified: Secondary | ICD-10-CM | POA: Diagnosis not present

## 2023-06-01 DIAGNOSIS — I2510 Atherosclerotic heart disease of native coronary artery without angina pectoris: Secondary | ICD-10-CM | POA: Diagnosis present

## 2023-06-01 DIAGNOSIS — R001 Bradycardia, unspecified: Secondary | ICD-10-CM | POA: Diagnosis present

## 2023-06-01 DIAGNOSIS — Z888 Allergy status to other drugs, medicaments and biological substances status: Secondary | ICD-10-CM

## 2023-06-01 DIAGNOSIS — I714 Abdominal aortic aneurysm, without rupture, unspecified: Secondary | ICD-10-CM | POA: Diagnosis not present

## 2023-06-01 DIAGNOSIS — Z681 Body mass index (BMI) 19 or less, adult: Secondary | ICD-10-CM

## 2023-06-01 DIAGNOSIS — Z79899 Other long term (current) drug therapy: Secondary | ICD-10-CM

## 2023-06-01 DIAGNOSIS — I130 Hypertensive heart and chronic kidney disease with heart failure and stage 1 through stage 4 chronic kidney disease, or unspecified chronic kidney disease: Secondary | ICD-10-CM | POA: Diagnosis not present

## 2023-06-01 DIAGNOSIS — R0602 Shortness of breath: Secondary | ICD-10-CM | POA: Diagnosis not present

## 2023-06-01 DIAGNOSIS — E43 Unspecified severe protein-calorie malnutrition: Secondary | ICD-10-CM | POA: Diagnosis not present

## 2023-06-01 DIAGNOSIS — F17200 Nicotine dependence, unspecified, uncomplicated: Secondary | ICD-10-CM | POA: Diagnosis present

## 2023-06-01 DIAGNOSIS — G8929 Other chronic pain: Secondary | ICD-10-CM | POA: Diagnosis present

## 2023-06-01 DIAGNOSIS — R778 Other specified abnormalities of plasma proteins: Secondary | ICD-10-CM | POA: Diagnosis not present

## 2023-06-01 DIAGNOSIS — Z960 Presence of urogenital implants: Secondary | ICD-10-CM | POA: Diagnosis present

## 2023-06-01 DIAGNOSIS — I21A1 Myocardial infarction type 2: Secondary | ICD-10-CM | POA: Diagnosis present

## 2023-06-01 DIAGNOSIS — I480 Paroxysmal atrial fibrillation: Secondary | ICD-10-CM | POA: Diagnosis not present

## 2023-06-01 DIAGNOSIS — I5023 Acute on chronic systolic (congestive) heart failure: Secondary | ICD-10-CM | POA: Diagnosis not present

## 2023-06-01 DIAGNOSIS — M109 Gout, unspecified: Secondary | ICD-10-CM | POA: Diagnosis present

## 2023-06-01 DIAGNOSIS — R0600 Dyspnea, unspecified: Secondary | ICD-10-CM | POA: Diagnosis not present

## 2023-06-01 DIAGNOSIS — I5022 Chronic systolic (congestive) heart failure: Secondary | ICD-10-CM | POA: Insufficient documentation

## 2023-06-01 DIAGNOSIS — I110 Hypertensive heart disease with heart failure: Secondary | ICD-10-CM | POA: Diagnosis not present

## 2023-06-01 DIAGNOSIS — N320 Bladder-neck obstruction: Secondary | ICD-10-CM | POA: Diagnosis present

## 2023-06-01 DIAGNOSIS — I10 Essential (primary) hypertension: Secondary | ICD-10-CM | POA: Diagnosis present

## 2023-06-01 DIAGNOSIS — I2489 Other forms of acute ischemic heart disease: Secondary | ICD-10-CM | POA: Diagnosis present

## 2023-06-01 DIAGNOSIS — N1831 Chronic kidney disease, stage 3a: Secondary | ICD-10-CM | POA: Diagnosis present

## 2023-06-01 DIAGNOSIS — Z85118 Personal history of other malignant neoplasm of bronchus and lung: Secondary | ICD-10-CM

## 2023-06-01 DIAGNOSIS — N182 Chronic kidney disease, stage 2 (mild): Secondary | ICD-10-CM | POA: Diagnosis present

## 2023-06-01 DIAGNOSIS — I4891 Unspecified atrial fibrillation: Secondary | ICD-10-CM | POA: Diagnosis present

## 2023-06-01 DIAGNOSIS — J441 Chronic obstructive pulmonary disease with (acute) exacerbation: Secondary | ICD-10-CM | POA: Diagnosis not present

## 2023-06-01 DIAGNOSIS — Z7902 Long term (current) use of antithrombotics/antiplatelets: Secondary | ICD-10-CM

## 2023-06-01 DIAGNOSIS — Z8679 Personal history of other diseases of the circulatory system: Secondary | ICD-10-CM

## 2023-06-01 DIAGNOSIS — R0689 Other abnormalities of breathing: Secondary | ICD-10-CM | POA: Diagnosis not present

## 2023-06-01 DIAGNOSIS — I517 Cardiomegaly: Secondary | ICD-10-CM | POA: Diagnosis not present

## 2023-06-01 DIAGNOSIS — J810 Acute pulmonary edema: Secondary | ICD-10-CM | POA: Diagnosis present

## 2023-06-01 DIAGNOSIS — E785 Hyperlipidemia, unspecified: Secondary | ICD-10-CM | POA: Diagnosis present

## 2023-06-01 DIAGNOSIS — I272 Pulmonary hypertension, unspecified: Secondary | ICD-10-CM | POA: Diagnosis not present

## 2023-06-01 DIAGNOSIS — Z79891 Long term (current) use of opiate analgesic: Secondary | ICD-10-CM

## 2023-06-01 DIAGNOSIS — Z9079 Acquired absence of other genital organ(s): Secondary | ICD-10-CM

## 2023-06-01 DIAGNOSIS — I252 Old myocardial infarction: Secondary | ICD-10-CM

## 2023-06-01 DIAGNOSIS — E441 Mild protein-calorie malnutrition: Secondary | ICD-10-CM | POA: Diagnosis present

## 2023-06-01 DIAGNOSIS — Z8546 Personal history of malignant neoplasm of prostate: Secondary | ICD-10-CM

## 2023-06-01 DIAGNOSIS — R918 Other nonspecific abnormal finding of lung field: Secondary | ICD-10-CM | POA: Diagnosis not present

## 2023-06-01 DIAGNOSIS — Z7982 Long term (current) use of aspirin: Secondary | ICD-10-CM

## 2023-06-01 DIAGNOSIS — I5020 Unspecified systolic (congestive) heart failure: Secondary | ICD-10-CM | POA: Insufficient documentation

## 2023-06-01 DIAGNOSIS — F32A Depression, unspecified: Secondary | ICD-10-CM | POA: Diagnosis present

## 2023-06-01 LAB — CBC WITH DIFFERENTIAL/PLATELET
Abs Immature Granulocytes: 0.03 10*3/uL (ref 0.00–0.07)
Basophils Absolute: 0 10*3/uL (ref 0.0–0.1)
Basophils Relative: 1 %
Eosinophils Absolute: 0.2 10*3/uL (ref 0.0–0.5)
Eosinophils Relative: 2 %
HCT: 36.3 % — ABNORMAL LOW (ref 39.0–52.0)
Hemoglobin: 11 g/dL — ABNORMAL LOW (ref 13.0–17.0)
Immature Granulocytes: 1 %
Lymphocytes Relative: 16 %
Lymphs Abs: 1 10*3/uL (ref 0.7–4.0)
MCH: 29.7 pg (ref 26.0–34.0)
MCHC: 30.3 g/dL (ref 30.0–36.0)
MCV: 98.1 fL (ref 80.0–100.0)
Monocytes Absolute: 0.3 10*3/uL (ref 0.1–1.0)
Monocytes Relative: 5 %
Neutro Abs: 5 10*3/uL (ref 1.7–7.7)
Neutrophils Relative %: 75 %
Platelets: 121 10*3/uL — ABNORMAL LOW (ref 150–400)
RBC: 3.7 MIL/uL — ABNORMAL LOW (ref 4.22–5.81)
RDW: 16.1 % — ABNORMAL HIGH (ref 11.5–15.5)
WBC: 6.6 10*3/uL (ref 4.0–10.5)
nRBC: 0 % (ref 0.0–0.2)

## 2023-06-01 LAB — BASIC METABOLIC PANEL
Anion gap: 9 (ref 5–15)
BUN: 15 mg/dL (ref 8–23)
CO2: 26 mmol/L (ref 22–32)
Calcium: 8.9 mg/dL (ref 8.9–10.3)
Chloride: 108 mmol/L (ref 98–111)
Creatinine, Ser: 1.48 mg/dL — ABNORMAL HIGH (ref 0.61–1.24)
GFR, Estimated: 48 mL/min — ABNORMAL LOW (ref >60)
Glucose, Bld: 114 mg/dL — ABNORMAL HIGH (ref 70–99)
Potassium: 3.2 mmol/L — ABNORMAL LOW (ref 3.5–5.1)
Sodium: 143 mmol/L (ref 135–145)

## 2023-06-01 LAB — RESP PANEL BY RT-PCR (RSV, FLU A&B, COVID)  RVPGX2
Influenza A by PCR: NEGATIVE
Influenza B by PCR: NEGATIVE
Resp Syncytial Virus by PCR: NEGATIVE
SARS Coronavirus 2 by RT PCR: NEGATIVE

## 2023-06-01 LAB — TROPONIN I (HIGH SENSITIVITY)
Troponin I (High Sensitivity): 80 ng/L — ABNORMAL HIGH (ref <18)
Troponin I (High Sensitivity): 136 ng/L (ref <18)
Troponin I (High Sensitivity): 124 ng/L (ref <18)

## 2023-06-01 LAB — BRAIN NATRIURETIC PEPTIDE: B Natriuretic Peptide: 645.8 pg/mL — ABNORMAL HIGH (ref 0.0–100.0)

## 2023-06-01 MED ORDER — LOSARTAN POTASSIUM 50 MG PO TABS
50.0000 mg | ORAL_TABLET | Freq: Every day | ORAL | Status: DC
  Administered 2023-06-01 – 2023-06-02 (×2): 50 mg via ORAL
  Filled 2023-06-01 (×2): qty 1

## 2023-06-01 MED ORDER — ROSUVASTATIN CALCIUM 20 MG PO TABS
40.0000 mg | ORAL_TABLET | Freq: Every day | ORAL | Status: DC
Start: 2023-06-01 — End: 2023-06-03
  Administered 2023-06-01 – 2023-06-03 (×3): 40 mg via ORAL
  Filled 2023-06-01 (×3): qty 2

## 2023-06-01 MED ORDER — GUAIFENESIN ER 600 MG PO TB12
600.0000 mg | ORAL_TABLET | Freq: Two times a day (BID) | ORAL | Status: DC
  Administered 2023-06-01 – 2023-06-03 (×5): 600 mg via ORAL
  Filled 2023-06-01 (×5): qty 1

## 2023-06-01 MED ORDER — SODIUM CHLORIDE 0.9% FLUSH: 3.0000 mL | INTRAVENOUS | Status: DC | PRN

## 2023-06-01 MED ORDER — POTASSIUM CHLORIDE CRYS ER 20 MEQ PO TBCR
40.0000 meq | EXTENDED_RELEASE_TABLET | Freq: Once | ORAL | Status: AC
  Administered 2023-06-01: 40 meq via ORAL
  Filled 2023-06-01: qty 2

## 2023-06-01 MED ORDER — HYDROCODONE-ACETAMINOPHEN 7.5-325 MG PO TABS
1.0000 | ORAL_TABLET | Freq: Two times a day (BID) | ORAL | Status: DC | PRN
  Administered 2023-06-02 – 2023-06-03 (×2): 1 via ORAL
  Filled 2023-06-01 (×2): qty 1

## 2023-06-01 MED ORDER — ASPIRIN 81 MG PO TBEC
81.0000 mg | DELAYED_RELEASE_TABLET | Freq: Every day | ORAL | Status: DC
  Administered 2023-06-01 – 2023-06-02 (×2): 81 mg via ORAL
  Filled 2023-06-01 (×2): qty 1

## 2023-06-01 MED ORDER — POLYETHYLENE GLYCOL 3350 17 G PO PACK
17.0000 g | PACK | Freq: Every day | ORAL | Status: DC
  Administered 2023-06-01 – 2023-06-03 (×3): 17 g via ORAL
  Filled 2023-06-01 (×3): qty 1

## 2023-06-01 MED ORDER — UREA 10 % EX LOTN: TOPICAL_LOTION | Freq: Every day | CUTANEOUS | Status: DC

## 2023-06-01 MED ORDER — AMLODIPINE BESYLATE 5 MG PO TABS
5.0000 mg | ORAL_TABLET | Freq: Every day | ORAL | Status: DC
  Administered 2023-06-01 – 2023-06-02 (×2): 5 mg via ORAL
  Filled 2023-06-01 (×2): qty 1

## 2023-06-01 MED ORDER — UREA 10 % EX LOTN
TOPICAL_LOTION | Freq: Every day | CUTANEOUS | Status: DC
  Filled 2023-06-01: qty 237

## 2023-06-01 MED ORDER — PANCRELIPASE (LIP-PROT-AMYL) 36000-114000 UNITS PO CPEP
36000.0000 [IU] | ORAL_CAPSULE | Freq: Three times a day (TID) | ORAL | Status: DC
  Administered 2023-06-01 – 2023-06-03 (×7): 36000 [IU] via ORAL
  Filled 2023-06-01 (×10): qty 1

## 2023-06-01 MED ORDER — CAMPHOR-MENTHOL 0.5-0.5 % EX LOTN
TOPICAL_LOTION | CUTANEOUS | Status: DC | PRN
  Filled 2023-06-01: qty 222

## 2023-06-01 MED ORDER — PANCRELIPASE (LIP-PROT-AMYL) 20000-63000 UNITS PO CPEP: 3.0000 | ORAL_CAPSULE | Freq: Three times a day (TID) | ORAL | Status: DC

## 2023-06-01 MED ORDER — PANTOPRAZOLE SODIUM 40 MG PO TBEC
40.0000 mg | DELAYED_RELEASE_TABLET | Freq: Every day | ORAL | Status: DC
Start: 2023-06-01 — End: 2023-06-03
  Administered 2023-06-01 – 2023-06-03 (×3): 40 mg via ORAL
  Filled 2023-06-01 (×3): qty 1

## 2023-06-01 MED ORDER — FUROSEMIDE 10 MG/ML IJ SOLN
40.0000 mg | Freq: Once | INTRAMUSCULAR | Status: AC
  Administered 2023-06-01: 40 mg via INTRAVENOUS
  Filled 2023-06-01: qty 4

## 2023-06-01 MED ORDER — ENOXAPARIN SODIUM 40 MG/0.4ML IJ SOSY
40.0000 mg | PREFILLED_SYRINGE | INTRAMUSCULAR | Status: DC
  Administered 2023-06-01 – 2023-06-03 (×3): 40 mg via SUBCUTANEOUS
  Filled 2023-06-01 (×4): qty 0.4

## 2023-06-01 MED ORDER — ESCITALOPRAM OXALATE 10 MG PO TABS
5.0000 mg | ORAL_TABLET | Freq: Every day | ORAL | Status: DC
  Administered 2023-06-01 – 2023-06-03 (×3): 5 mg via ORAL
  Filled 2023-06-01 (×3): qty 1

## 2023-06-01 MED ORDER — CLOPIDOGREL BISULFATE 75 MG PO TABS
75.0000 mg | ORAL_TABLET | Freq: Every day | ORAL | Status: DC
Start: 2023-06-01 — End: 2023-06-03
  Administered 2023-06-01 – 2023-06-03 (×3): 75 mg via ORAL
  Filled 2023-06-01 (×3): qty 1

## 2023-06-01 MED ORDER — ASPIRIN 81 MG PO CHEW
324.0000 mg | CHEWABLE_TABLET | Freq: Once | ORAL | Status: AC
  Administered 2023-06-01: 324 mg via ORAL
  Filled 2023-06-01: qty 4

## 2023-06-01 MED ORDER — ALBUTEROL SULFATE (2.5 MG/3ML) 0.083% IN NEBU
5.0000 mg | INHALATION_SOLUTION | Freq: Once | RESPIRATORY_TRACT | Status: AC
  Administered 2023-06-01: 5 mg via RESPIRATORY_TRACT
  Filled 2023-06-01: qty 6

## 2023-06-01 MED ORDER — MIRTAZAPINE 15 MG PO TABS
15.0000 mg | ORAL_TABLET | Freq: Every day | ORAL | Status: DC
  Administered 2023-06-01 – 2023-06-02 (×2): 15 mg via ORAL
  Filled 2023-06-01 (×2): qty 1

## 2023-06-01 MED ORDER — NITROGLYCERIN 2 % TD OINT
1.0000 [in_us] | TOPICAL_OINTMENT | Freq: Once | TRANSDERMAL | Status: AC
  Administered 2023-06-01: 1 [in_us] via TOPICAL
  Filled 2023-06-01: qty 1

## 2023-06-01 NOTE — ED Notes (Signed)
Pt informed breakfast tray should be coming shortly.

## 2023-06-01 NOTE — ED Notes (Signed)
Pt states he will wait until breakfast to take his potassium supplement

## 2023-06-01 NOTE — Progress Notes (Signed)
   06/01/23 2012  BiPAP/CPAP/SIPAP  BiPAP/CPAP/SIPAP Pt Type Adult  BiPAP/CPAP/SIPAP V60  BiPAP/CPAP /SiPAP Vitals  Pulse Rate 65  Resp 16  SpO2 95 %   Pt has PRN BIPAP orders, no distress noted at this time.

## 2023-06-01 NOTE — Progress Notes (Signed)
Summary: 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    Subjective:  Day #1. Patient resting in bed, conversing appropriately with team. He denies any current CP, SOB, dysuria, or suprapubic pain. States he feels sleepy and has had an intermittent cough. Patient also reported coughing at home along with some greenish sputum, although this is not different than his baseline. States he takes his medications consistently. Requested something to eat. Would like for brother to be updated.   Objective:  Vital signs in last 24 hours: Vitals:   06/01/23 1015 06/01/23 1016 06/01/23 1017 06/01/23 1038  BP: (!) 156/86  (!) 156/86 (!) 163/87  Pulse: 82  82 82  Resp:   18 18  Temp:  98.2 F (36.8 C)  98 F (36.7 C)  TempSrc:    Oral  SpO2: 100%  97% 92%  Weight:    62 kg  Height:    6' (1.829 m)      Latest Ref Rng & Units 06/01/2023    2:20 AM 04/24/2023   12:24 AM 10/17/2022    3:36 AM  CBC  WBC 4.0 - 10.5 K/uL 6.6  4.5  5.3   Hemoglobin 13.0 - 17.0 g/dL 62.1  9.7  9.7   Hematocrit 39.0 - 52.0 % 36.3  31.8  31.1   Platelets 150 - 400 K/uL 121  86  143        Latest Ref Rng & Units 06/01/2023    2:20 AM 04/24/2023   12:24 AM 10/17/2022    3:36 AM  BMP  Glucose 70 - 99 mg/dL 308  92  78   BUN 8 - 23 mg/dL 15  24  12    Creatinine 0.61 - 1.24 mg/dL 6.57  8.46  9.62   Sodium 135 - 145 mmol/L 143  140  141   Potassium 3.5 - 5.1 mmol/L 3.2  3.9  3.7   Chloride 98 - 111 mmol/L 108  106  111   CO2 22 - 32 mmol/L 26  23  22    Calcium 8.9 - 10.3 mg/dL 8.9  8.7  8.2     Troponin I 80 > 136 > 124  Physical Exam Constitutional: Patient is resting in bed in no acute distress.  CV: Irregularly irregular rhythm, louder S2, warm extremities.   Pulmonary/Respiratory: Clear lungs bilaterally, normal respiratory effort on room air.  Abdominal: Soft, non-tender, non-distended.  GU: Patient's suprapubic catheter site clean, no bleeding, edema, or erythema observed, no tenderness to palpation in surrounding area.  Skin: Warm and dry. Psych: Normal mood and affect.   Assessment/Plan:  Principal Problem:   Flash pulmonary edema (HCC) Active Problems:   Tobacco use disorder   Essential hypertension   CKD (chronic kidney disease) stage 2, GFR 60-89 ml/min   AAA (abdominal aortic aneurysm) (HCC)   Chronic use of opiate for therapeutic purpose   Urinary retention   Protein-calorie malnutrition, severe (HCC)   Atrial fibrillation (HCC)   Emphysema lung (HCC)  [name] is a 77 yo with a history of emphysema, CHF, GERD, Hyperlipidemia, HTN, non-small cell carcinoma of the right lung who presented to the ED due to sudden onset shortness of breath and was admitted to the Internal Medicine Teaching Service on  12/15 for flash pulmonary edema.    Flash Pulmonary Edema  Patient is on room air with no further episodes of sudden shortness of breath s/p Bipap, supplemental O2, and IV furosemide 40 mg x 1 dose. Potassium 3.2 this morning. Sudden shortness of breath that patient experienced at home likely due to HTN leading to pulmonary edema, which helps to explain rapid progression and resolution of symptoms. Home blood pressure medications include amlodipine 5 mg daily and losartan 50 mg daily. BP here have been 150s-160s systolic, will resume home medications. Will continue to monitor BP.  Plan: - Resume home amlodipine 5 mg daily and losartan 50 mg daily, titrate as needed - Replete potassium with PO, monitor daily BMP    New RBBB Demand ischemia- resolved Patient with initial complaint of diffuse chest pain/discomfort. Noted to have cardiomegaly on CXR, BMP 645, and EKG with new RBBB. Does have significant CV history including HTN, Afib,  AAA s/p repair, and CAD. Last TTE in 2022 with LVEF 55-60% and G1DD. No CP or signs of volume overload on morning exam.  Troponins peaked (124 > 136 > 80, low concern for ACS at this time, likely elevated in the setting of demand ischemia. Will hold off additional diuresis as patient appears to be euvolemic. Will repeat echo and consult cardiology to discuss new RBBB, which was not present on EKG March 2024.  Plan: - Echocardiogram ordered - Follow up cardiology recommendations  History of emphysema No PFTs, emphysema on lung imaging. Clear lungs bilaterally. Patient with baseline coughing and sputum production, still smoking about a pack of cigarettes a day.   History of AAA and CAD S/p AAA repair March 2024, follows with vascular surgery. On DAPT. Resumed Aspirin 81 mg daily, Plavix 75 mg daily, and rosuvastatin 40 mg daily.    Paroxysmal atrial fibrillation Afib present on EKG and physical exam. Rate controlled here. Asymptomatic. 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 Also previously on metoprolol 100 mg daily but this was held around May 2024 due to low blood pressure concerns. Will monitor.   Chronic hip pain Continued hydrocodone-acetaminophen (Norco) 7.5-325 mg BID PRN on admission.    Chronic pancreatic insufficiency  Takes Zenpep tablets with meals. Ordered CREON TID with meals on admission.    CKD 2 History of CKD stage II. Worsening GFR (48, 47) suggestive of progression to CKD 3a. Started on low dose ARB by PCP, expected rise in Cr. Low concern for AKI. Will monitor daily BMP.   Bladder outlet obstruction History of urinary retention  Suprapubic catheter in place, clean site. Per patient, site is assessed on a monthly basis and his catheter was recently changed out about 3 months ago. No tenderness to palpation. Will monitor.   Psychiatric medications for  mood/appetite History of depression and food aversion. Resumed home escitalopram 5 mg daily and mirtazapine 15 mg daily at bedtime on admission.   Diet:  Regular  VTE: sq Enoxaparin 40  IVF: None Code: Full Code  Prior to Admission Living Arrangement: Home Anticipated Discharge Location: pending medical management Barriers to Discharge: pending medical management Dispo: Anticipated discharge in approximately 1-2 day(s).   Philomena Doheny, MD, PGY-1 06/01/2023, 11:51 AM Pager: [phone] After 5pm on weekdays and 1pm on weekends: On Call pager [phone]

## 2023-06-01 NOTE — Hospital Course (Addendum)
Principal Problem:   Flash pulmonary edema (HCC) Active Problems:   Tobacco use disorder   Essential hypertension   CKD (chronic kidney disease) stage 2, GFR 60-89 ml/min   AAA (abdominal aortic aneurysm) (HCC)   Chronic use of opiate for therapeutic purpose   Urinary retention   Protein-calorie malnutrition, severe (HCC)   Atrial fibrillation (HCC)   Emphysema lung (HCC)  Resolved Problems:   * No resolved hospital problems. *  Consults: -?cardiology  Procedures:***  Follow-up items:***  77 year old male with ASCVD including CAD and AAA, hypertension, presents with sudden onset dyspnea and chest discomfort, admitted for acute hypoxic respiratory failure likely due to flash pulmonary edema.  Flash pulmonary edema With acute hypoxic respiratory failure and pulmonary edema on chest x-ray.  Feels like he is back to his baseline after treatment in the ED with BiPAP, Lasix, and Nitropaste.  Lungs are without crackles now.  Maintaining saturation between 88 and 92% on low-flow oxygen via nasal cannula.  Note poorly controlled hypertension in this person, wonder if this is etiology of the episode this evening.  Plan to watch BP closely and consider increasing dose of outpatient therapy.  Monitor outputs with furosemide.  Will defer further doses as he does not look volume overloaded now.  Considered other etiologies of acute hypoxic respiratory failure in this person, including heart failure exacerbation but sudden onset does not really fit with this.  ACS is less likely given atypical description of chest discomfort, will trend troponin nevertheless.  With reported chest discomfort and known history of AAA, considered aortic pathology.  Description of discomfort is atypical for aortic dissection, no pain per se, just dyspnea.  Thus we will defer CT angiogram.  COPD exacerbation less likely based on tempo and lack of wheezing.  Hypertension On low-dose of amlodipine and losartan outside of the  hospital.  Will continue these medicines.  Status post nitroglycerin paste in the emergency department, which has helped, BP now in the 140s systolic, equal in both arms.  This person may need PRNs for hypertension because I think this probably contributed to his presentation this evening.  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  On aspirin clopidogrel which we will continue.  Also continue statin.  Atrial fibrillation No A-fib at present.  Rate is normal.  Not on anticoagulation because of bleeding complications, also on DAPT currently.  History of emphysema Based on imaging.  No PFTs on file.  Diminished lung sounds throughout, but no wheezing.  Do not think reactive airway disease is at play here.  Probably has an element of pulmonary hypertension with loud P2 and long smoking history.  Chronic hip pain Takes hydrocodone twice a day for this.  Will continue for this while admitted.  Urinary retention With suprapubic catheter that is normal-appearing.  No problems with this at present.  Elevated creatinine History of CKD stage II.  GFR last 2 encounters over the last month is suggestive of CKD 3 A.  Note he was started on low-dose of ARB which comes with concomitant creatinine rise, to be expected.  I do not think elevated creatinine is is reflective of true AKI at present.  Monitor BMP.  Going to restart ARB per above.

## 2023-06-01 NOTE — ED Notes (Signed)
ED TO INPATIENT HANDOFF REPORT  ED Nurse Name and Phone #: Carollee Herter [phone]  S Name/Age/Gender [name] 77 y.o. male Room/Bed: 003C/003C  Code Status   Code Status: Full Code  Home/SNF/Other Home Patient oriented to: self, place, time, and situation Is this baseline? Yes   Triage Complete: Triage complete  Chief Complaint Flash pulmonary edema (HCC) [J81.0]  Triage Note Patient BIBA from home with sudden onset of SOB while laying down. 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 Denies chest pain. Patient was cool and diaphoretic on EMS arrival. RA-70% RA and tachypnic at 42 with EMS. EMS gave albuterol neb and 125 mg of solumedrol. 20 gauge in LFA.    Allergies Allergies  Allergen Reactions   Candesartan Cilexetil-Hctz Other (See Comments)    Acute renal failure   Hydrochlorothiazide Other (See Comments)    Acute renal failure   Shellfish Allergy Other (See Comments)    Gout occurs   Betadine [Povidone Iodine] Itching    Level of Care/Admitting Diagnosis ED Disposition     ED Disposition  Admit   Condition  --   Comment  Hospital Area: [facility] [[id number]]  Level of Care: Telemetry Cardiac [[id number]]  May place patient in observation at [facility] or [facility] if equivalent level of care is available:: No  Covid Evaluation: Confirmed COVID Negative  Diagnosis: Flash pulmonary edema [facility]) [[id number]]  Admitting Physician: Dickie La [[id number]]  Attending Physician: Dickie La [[id number]]          B Medical/Surgery History Past Medical History:  Diagnosis Date   AAA (abdominal aortic aneurysm) (HCC)    followed by vasc surgery   Alcohol abuse    Barretts esophagus    bx distal esophagus 2011 neg    Bladder outlet obstruction 04/02/2018   CAD in native artery    a. s/p stent to prox RCA and mid Cx.   Cataract    Cholelithiasis    Chronic osteomyelitis involving ankle and foot, right (HCC) 07/13/2018   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    2006 - 3 adenomas largest 12 mm  2008 - no polyps 2013-  2 mm cecal polyp, adenoma, routine repeat colonoscopy 02/2017 approx 05/14/2017 18 polyps max 12 mm ALL adenomas recall 2019 Iva Boop, MD, [facility]      Dieulafoy lesion of duodenum    Diverticulosis of colon    DJD (degenerative joint disease)    left hip   Full dentures    upper and lower   GERD (gastroesophageal reflux disease)    diet controlled    Gout    feet   Hiatal hernia    2 cm noted on upper endos 2011    Hx of adenomatous colonic polyps    Hyperlipidemia    Hypertension    Internal hemorrhoid    Lung cancer (HCC)    squamous cell ([id number]), 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 pt was on Plavix x 3 years then transitioned to ASA, no problems since   Non-small cell carcinoma of right lung, stage 1 (HCC) 01/30/2016   Renal failure, acute (HCC)    hx of 2/2 medication, resolved   Tobacco abuse    smoker .5 ppd x 55 yrs   UTI (lower urinary tract infection)    Past Surgical History:  Procedure Laterality Date   ABDOMINAL AORTIC ENDOVASCULAR FENESTRATED STENT GRAFT N/A 08/16/2022  Procedure: ABDOMINAL AORTIC ENDOVASCULAR FENESTRATED STENT GRAFT;  Surgeon: Nada Libman, MD;  Location: [facility] OR;  Service: Vascular;  Laterality: N/A;   BIOPSY  11/12/2020   Procedure: BIOPSY;  Surgeon: Sherrilyn Rist, MD;  Location: [facility] ENDOSCOPY;  Service: Gastroenterology;;   BIOPSY  07/25/2021   Procedure: BIOPSY;  Surgeon: Jenel Lucks, MD;  Location: [facility] ENDOSCOPY;  Service: Gastroenterology;;   BIOPSY  12/24/2021   Procedure: BIOPSY;  Surgeon: Lemar Lofty., MD;  Location: [facility] ENDOSCOPY;  Service: Gastroenterology;;   BRONCHIAL BIOPSY  07/27/2021   Procedure: BRONCHIAL BIOPSIES;  Surgeon: Leslye Peer, MD;  Location: [facility] ENDOSCOPY;  Service: Cardiopulmonary;;   BRONCHIAL BRUSHINGS  07/27/2021   Procedure: BRONCHIAL BRUSHINGS;  Surgeon: Leslye Peer, MD;  Location: [facility] ENDOSCOPY;  Service: Cardiopulmonary;;   cataract surgery Bilateral 2018   removal of cataracts   COLONOSCOPY     hx polyps - Leone Payor 02/2012   COLONOSCOPY WITH PROPOFOL N/A 11/12/2020   Procedure: COLONOSCOPY WITH PROPOFOL;  Surgeon: Sherrilyn Rist, MD;  Location: [facility] ENDOSCOPY;  Service: Gastroenterology;  Laterality: N/A;   CORONARY ANGIOPLASTY WITH STENT PLACEMENT     ENDOVASCULAR STENT INSERTION Bilateral 08/16/2022   Procedure: ENDOVASCULAR STENT GRAFT INSERTION OF BILATERAL RENAL ARTERIES;  Surgeon: Nada Libman, MD;  Location: [facility] OR;  Service: Vascular;  Laterality: Bilateral;   ESOPHAGOGASTRODUODENOSCOPY (EGD) WITH PROPOFOL N/A 11/12/2020   Procedure: ESOPHAGOGASTRODUODENOSCOPY (EGD) WITH PROPOFOL;  Surgeon: Sherrilyn Rist, MD;  Location: [facility] ENDOSCOPY;  Service: Gastroenterology;  Laterality: N/A;   ESOPHAGOGASTRODUODENOSCOPY (EGD) WITH PROPOFOL N/A 03/06/2021   Procedure: ESOPHAGOGASTRODUODENOSCOPY (EGD) WITH PROPOFOL;  Surgeon: Benancio Deeds, MD;  Location: [facility] ENDOSCOPY;  Service: Gastroenterology;  Laterality: N/A;   ESOPHAGOGASTRODUODENOSCOPY (EGD) WITH PROPOFOL N/A 07/25/2021   Procedure: ESOPHAGOGASTRODUODENOSCOPY (EGD) WITH PROPOFOL;  Surgeon: Jenel Lucks, MD;  Location: [facility] ENDOSCOPY;  Service: Gastroenterology;  Laterality: N/A;   ESOPHAGOGASTRODUODENOSCOPY (EGD) WITH PROPOFOL N/A 12/24/2021   Procedure: ESOPHAGOGASTRODUODENOSCOPY (EGD) WITH PROPOFOL;  Surgeon: Meridee Score Netty Starring., MD;  Location: [facility] ENDOSCOPY;  Service: Gastroenterology;  Laterality: N/A;   flexible cystourethroscopy  05/24/06   HEMOSTASIS CLIP PLACEMENT  03/06/2021   Procedure: HEMOSTASIS CLIP PLACEMENT;  Surgeon: Benancio Deeds, MD;  Location: [facility] ENDOSCOPY;  Service: Gastroenterology;;   HEMOSTASIS CLIP PLACEMENT  12/24/2021   Procedure: HEMOSTASIS CLIP PLACEMENT;  Surgeon: Lemar Lofty., MD;  Location: [facility] ENDOSCOPY;  Service: Gastroenterology;;   HOT  HEMOSTASIS N/A 03/06/2021   Procedure: HOT HEMOSTASIS (ARGON PLASMA COAGULATION/BICAP);  Surgeon: Benancio Deeds, MD;  Location: [facility] ENDOSCOPY;  Service: Gastroenterology;  Laterality: N/A;   INGUINAL HERNIA REPAIR  2005   INSERTION OF ILIAC STENT Bilateral 08/16/2022   Procedure: INSERTION OF ILIAC STENT RIGHT EXTERNAL ARTERY, LEFT COMMON ARTERY, AND RIGHT COMMON ARTERY;  Surgeon: Nada Libman, MD;  Location: [facility] OR;  Service: Vascular;  Laterality: Bilateral;   IR CATHETER TUBE CHANGE  07/08/2022   LUNG LOBECTOMY Right 11/07   RUL   MULTIPLE TOOTH EXTRACTIONS     POLYPECTOMY  11/12/2020   Procedure: POLYPECTOMY;  Surgeon: Sherrilyn Rist, MD;  Location: [facility] ENDOSCOPY;  Service: Gastroenterology;;   POLYPECTOMY  12/24/2021   Procedure: POLYPECTOMY;  Surgeon: Lemar Lofty., MD;  Location: [facility] ENDOSCOPY;  Service: Gastroenterology;;   SUBMUCOSAL LIFTING INJECTION  12/24/2021   Procedure: SUBMUCOSAL LIFTING INJECTION;  Surgeon: Lemar Lofty., MD;  Location: [facility] ENDOSCOPY;  Service: Gastroenterology;;   TRANSURETHRAL RESECTION OF PROSTATE N/A 05/02/2021  Procedure: TRANSURETHRAL RESECTION OF THE PROSTATE (TURP);  Surgeon: Crista Elliot, MD;  Location: [facility] ORS;  Service: Urology;  Laterality: N/A;   ULTRASOUND GUIDANCE FOR VASCULAR ACCESS Bilateral 08/16/2022   Procedure: ULTRASOUND GUIDANCE FOR VASCULAR ACCESS;  Surgeon: Nada Libman, MD;  Location: [facility] OR;  Service: Vascular;  Laterality: Bilateral;   UPPER ESOPHAGEAL ENDOSCOPIC ULTRASOUND (EUS) N/A 12/24/2021   Procedure: UPPER ESOPHAGEAL ENDOSCOPIC ULTRASOUND (EUS);  Surgeon: Lemar Lofty., MD;  Location: [facility] ENDOSCOPY;  Service: Gastroenterology;  Laterality: N/A;   UPPER GASTROINTESTINAL ENDOSCOPY     VIDEO BRONCHOSCOPY  07/27/2021   Procedure: VIDEO BRONCHOSCOPY WITHOUT FLUORO;  Surgeon: Leslye Peer, MD;  Location: [facility] ENDOSCOPY;  Service: Cardiopulmonary;;     A IV Location/Drains/Wounds Patient  Lines/Drains/Airways Status     Active Line/Drains/Airways     Name Placement date Placement time Site Days   Peripheral IV 06/01/23 Left;Posterior Forearm 06/01/23  0146  Forearm  less than 1   Suprapubic Catheter Latex 18 Fr. 07/08/22  1058  Latex  328            Intake/Output Last 24 hours  Intake/Output Summary (Last 24 hours) at 06/01/2023 1006 Last data filed at 06/01/2023 0557 Gross per 24 hour  Intake --  Output 950 ml  Net -950 ml    Labs/Imaging Results for orders placed or performed during the hospital encounter of 06/01/23 (from the past 48 hours)  CBC with Differential     Status: Abnormal   Collection Time: 06/01/23  2:20 AM  Result Value Ref Range   WBC 6.6 4.0 - 10.5 K/uL   RBC 3.70 (L) 4.22 - 5.81 MIL/uL   Hemoglobin 11.0 (L) 13.0 - 17.0 g/dL   HCT 82.9 (L) 56.2 - 13.0 %   MCV 98.1 80.0 - 100.0 fL   MCH 29.7 26.0 - 34.0 pg   MCHC 30.3 30.0 - 36.0 g/dL   RDW 86.5 (H) 78.4 - 69.6 %   Platelets 121 (L) 150 - 400 K/uL   nRBC 0.0 0.0 - 0.2 %   Neutrophils Relative % 75 %   Neutro Abs 5.0 1.7 - 7.7 K/uL   Lymphocytes Relative 16 %   Lymphs Abs 1.0 0.7 - 4.0 K/uL   Monocytes Relative 5 %   Monocytes Absolute 0.3 0.1 - 1.0 K/uL   Eosinophils Relative 2 %   Eosinophils Absolute 0.2 0.0 - 0.5 K/uL   Basophils Relative 1 %   Basophils Absolute 0.0 0.0 - 0.1 K/uL   Immature Granulocytes 1 %   Abs Immature Granulocytes 0.03 0.00 - 0.07 K/uL    Comment: Performed at [facility], [address] [place], [place] [address]  Basic metabolic panel     Status: Abnormal   Collection Time: 06/01/23  2:20 AM  Result Value Ref Range   Sodium 143 135 - 145 mmol/L   Potassium 3.2 (L) 3.5 - 5.1 mmol/L   Chloride 108 98 - 111 mmol/L   CO2 26 22 - 32 mmol/L   Glucose, Bld 114 (H) 70 - 99 mg/dL    Comment: Glucose reference range applies only to samples taken after fasting for at least 8 hours.   BUN 15 8 - 23 mg/dL   Creatinine, Ser 4.13 (H) 0.61 - 1.24 mg/dL    Calcium 8.9 8.9 - 24.4 mg/dL   GFR, Estimated 48 (L) >60 mL/min    Comment: (NOTE) Calculated using the CKD-EPI Creatinine Equation (2021)    Anion  gap 9 5 - 15    Comment: Performed at [facility], [address] [place], [place] [address]  Troponin I (High Sensitivity)     Status: Abnormal   Collection Time: 06/01/23  2:20 AM  Result Value Ref Range   Troponin I (High Sensitivity) 80 (H) <18 ng/L    Comment: (NOTE) Elevated high sensitivity troponin I (hsTnI) values and significant  changes across serial measurements may suggest ACS but many other  chronic and acute conditions are known to elevate hsTnI results.  Refer to the "Links" section for chest pain algorithms and additional  guidance. Performed at [facility], [address] [place], [place] [address]   Brain natriuretic peptide     Status: Abnormal   Collection Time: 06/01/23  2:20 AM  Result Value Ref Range   B Natriuretic Peptide 645.8 (H) 0.0 - 100.0 pg/mL    Comment: Performed at [facility], [address], [place], [place] [address]  Resp panel by RT-PCR (RSV, Flu A&B, Covid) Anterior Nasal Swab     Status: None   Collection Time: 06/01/23  2:20 AM   Specimen: Anterior Nasal Swab  Result Value Ref Range   SARS Coronavirus 2 by RT PCR NEGATIVE NEGATIVE   Influenza A by PCR NEGATIVE NEGATIVE   Influenza B by PCR NEGATIVE NEGATIVE    Comment: (NOTE) The Xpert Xpress SARS-CoV-2/FLU/RSV plus assay is intended as an aid in the diagnosis of influenza from Nasopharyngeal swab specimens and should not be used as a sole basis for treatment. Nasal washings and aspirates are unacceptable for Xpert Xpress SARS-CoV-2/FLU/RSV testing.  Fact Sheet for Patients: BloggerCourse.com  Fact Sheet for Healthcare Providers: SeriousBroker.it  This test is not yet approved or cleared by the [place] FDA and has been authorized for detection and/or  diagnosis of SARS-CoV-2 by FDA under an Emergency Use Authorization (EUA). This EUA will remain in effect (meaning this test can be used) for the duration of the COVID-19 declaration under Section 564(b)(1) of the Act, 21 U.S.C. section 360bbb-3(b)(1), unless the authorization is terminated or revoked.     Resp Syncytial Virus by PCR NEGATIVE NEGATIVE    Comment: (NOTE) Fact Sheet for Patients: BloggerCourse.com  Fact Sheet for Healthcare Providers: SeriousBroker.it  This test is not yet approved or cleared by the [place] FDA and has been authorized for detection and/or diagnosis of SARS-CoV-2 by FDA under an Emergency Use Authorization (EUA). This EUA will remain in effect (meaning this test can be used) for the duration of the COVID-19 declaration under Section 564(b)(1) of the Act, 21 U.S.C. section 360bbb-3(b)(1), unless the authorization is terminated or revoked.  Performed at [facility], [address], [place], [place] [address]   Troponin I (High Sensitivity)     Status: Abnormal   Collection Time: 06/01/23  5:01 AM  Result Value Ref Range   Troponin I (High Sensitivity) 136 (HH) <18 ng/L    Comment: CRITICAL RESULT CALLED TO, READ BACK BY AND VERIFIED WITH A. SHANDS, RN AT [phone] 12.15.24 JLASIGAN (NOTE) Elevated high sensitivity troponin I (hsTnI) values and significant  changes across serial measurements may suggest ACS but many other  chronic and acute conditions are known to elevate hsTnI results.  Refer to the "Links" section for chest pain algorithms and additional  guidance. Performed at [facility], [address] [place], [place] [address]   Troponin I (High Sensitivity)     Status: Abnormal  Collection Time: 06/01/23  6:57 AM  Result Value Ref Range   Troponin I (High Sensitivity) 124 (HH) <18 ng/L    Comment: CRITICAL VALUE NOTED. VALUE IS CONSISTENT WITH PREVIOUSLY REPORTED/CALLED  VALUE (NOTE) Elevated high sensitivity troponin I (hsTnI) values and significant  changes across serial measurements may suggest ACS but many other  chronic and acute conditions are known to elevate hsTnI results.  Refer to the "Links" section for chest pain algorithms and additional  guidance. Performed at [facility], [address], [place], [place] [address]    DG Chest Portable 1 View Result Date: 06/01/2023 CLINICAL DATA:  Shortness of breath EXAM: PORTABLE CHEST 1 VIEW COMPARISON:  Chest x-ray 08/16/2022 FINDINGS: Patient is rotated. The heart is enlarged, unchanged. There is central interstitial prominence bilaterally. There some atelectatic changes in the lung bases. There is no pleural effusion or pneumothorax. No acute fractures are seen. IMPRESSION: Cardiomegaly with central interstitial prominence bilaterally, likely pulmonary edema. Electronically Signed   By: Darliss Cheney M.D.   On: 06/01/2023 02:30    Pending Labs Unresulted Labs (From admission, onward)    None       Vitals/Pain Today's Vitals   06/01/23 0832 06/01/23 0900 06/01/23 0912 06/01/23 0930  BP:      Pulse:  71  64  Resp:      Temp:      TempSrc:      SpO2:  93%  92%  Weight:      Height:      PainSc: 0-No pain  Asleep     Isolation Precautions No active isolations  Medications Medications  enoxaparin (LOVENOX) injection 40 mg (has no administration in time range)  sodium chloride flush (NS) 0.9 % injection 3 mL (has no administration in time range)  polyethylene glycol (MIRALAX / GLYCOLAX) packet 17 g (has no administration in time range)  amLODipine (NORVASC) tablet 5 mg (has no administration in time range)  aspirin EC tablet 81 mg (has no administration in time range)  clopidogrel (PLAVIX) tablet 75 mg (has no administration in time range)  escitalopram (LEXAPRO) tablet 5 mg (has no administration in time range)  HYDROcodone-acetaminophen (NORCO) 7.5-325 MG per tablet 1 tablet  (has no administration in time range)  losartan (COZAAR) tablet 50 mg (has no administration in time range)  mirtazapine (REMERON) tablet 15 mg (has no administration in time range)  pantoprazole (PROTONIX) EC tablet 40 mg (has no administration in time range)  rosuvastatin (CRESTOR) tablet 40 mg (has no administration in time range)  lipase/protease/amylase (CREON) capsule 36,000 Units (36,000 Units Oral Given 06/01/23 0801)  urea 10 % lotion ( Topical Given 06/01/23 0633)  guaiFENesin (MUCINEX) 12 hr tablet 600 mg (has no administration in time range)  camphor-menthol (SARNA) lotion (has no administration in time range)  albuterol (PROVENTIL) (2.5 MG/3ML) 0.083% nebulizer solution 5 mg (5 mg Nebulization Given 06/01/23 0229)  nitroGLYCERIN (NITROGLYN) 2 % ointment 1 inch (1 inch Topical Given 06/01/23 0222)  aspirin chewable tablet 324 mg (324 mg Oral Given 06/01/23 0217)  furosemide (LASIX) injection 40 mg (40 mg Intravenous Given 06/01/23 0409)  potassium chloride SA (KLOR-CON M) CR tablet 40 mEq (40 mEq Oral Given 06/01/23 0800)    Mobility walks     Focused Assessments    R Recommendations: See Admitting Provider Note  Report given to:   Additional Notes:

## 2023-06-01 NOTE — Plan of Care (Signed)
  Problem: Education: Goal: Knowledge of discharge needs will improve Outcome: Progressing   Problem: Clinical Measurements: Goal: Postoperative complications will be avoided or minimized Outcome: Progressing   Problem: Respiratory: Goal: Ability to achieve and maintain a regular respiratory rate will improve Outcome: Progressing   Problem: Skin Integrity: Goal: Demonstration of wound healing without infection will improve Outcome: Progressing   

## 2023-06-01 NOTE — ED Notes (Signed)
Trinna Post, RN notified MD-[place] of patient's critical trop-136

## 2023-06-01 NOTE — H&P (Addendum)
Date: 06/01/2023               Patient Name:  [name] MRN: [id number]  DOB: 08-17-1945 Age / Sex: 77 y.o., male   PCP: Tyson Alias, MD         Medical Service: Internal Medicine Teaching Service         Attending Physician: Dr. Dickie La MD      First Contact: Dr. Monna Fam, MD Pager [phone]    Second Contact: Dr. Rocky Morel, DO Pager [phone]         After Hours (After 5p/  First Contact Pager: [phone]  weekends / holidays): Second Contact Pager: [phone]   SUBJECTIVE   Chief Complaint: Shortness of breath   History of Present Illness:  [name] is a 77 year old male with  a history  of  Emphysema, CHF, GERD, Hyperlipidemia ,hypertension,Non-small cell carcinoma of right lung  who presented to the ED via ambulance due to to concerns for sudden onset of SOB that got worsened by laying down.  Few hours prior to the onset of SOB, patient reports he was in his normal state of health, listening to music when he suddenly began gasping me air and panicking. He then reached out to his neighbor who helped him call the ambulance.He denied any associated cough, fevers or chills or dizziness during this period. He however reported a diffuse " heavy like " chest pain during the episode but has since resolved after the ambulance arrived.   Largely abstinent from alcohol for 3 years. Used to drink ~3 half gallons of gin per week. Now drinks the occasional beer.   Review of Systems: A complete ROS was negative except as per HPI.   Past Medical History HTN Emphysema CHF Gout GERD Non-small cell carcinoma of right lung   Meds:  Current Meds  Medication Sig   albuterol (VENTOLIN HFA) 108 (90 Base) MCG/ACT inhaler INHALE 1-2 PUFFS BY MOUTH EVERY 6 HOURS AS NEEDED FOR WHEEZE OR SHORTNESS OF BREATH   amLODipine (NORVASC) 5 MG tablet Take 1 tablet (5 mg total) by mouth daily.   aspirin EC 81 MG tablet Take 1 tablet (81 mg total) by mouth daily. Swallow whole.  (Patient taking differently: Take 81 mg by mouth at bedtime. Swallow whole.)   clopidogrel (PLAVIX) 75 MG tablet TAKE 1 TABLET BY MOUTH EVERY DAY   escitalopram (LEXAPRO) 5 MG tablet TAKE 1 TABLET (5 MG TOTAL) BY MOUTH DAILY.   HYDROcodone-acetaminophen (NORCO) 7.5-325 MG tablet Take 1 tablet by mouth 2 (two) times daily as needed for moderate pain (pain score 4-6).   losartan (COZAAR) 50 MG tablet Take 1 tablet (50 mg total) by mouth daily.   mirtazapine (REMERON) 7.5 MG tablet Take 2 tablets (15 mg total) by mouth at bedtime.   Pancrelipase, Lip-Prot-Amyl, (ZENPEP) 20000-63000 units CPEP Take 3 capsules by mouth 3 (three) times daily before meals.   pantoprazole (PROTONIX) 40 MG tablet TAKE 1 TABLET EVERY DAY FOR REFLUX   rosuvastatin (CRESTOR) 40 MG tablet Take 1 tablet (40 mg total) by mouth daily.   senna (SENOKOT) 8.6 MG TABS tablet Take 2 tablets (17.2 mg total) by mouth in the morning and at bedtime. (Patient taking differently: Take 1 tablet by mouth in the morning and at bedtime.)    Allergies: Allergies as of 06/01/2023 - Review Complete 06/01/2023  Allergen Reaction Noted   Candesartan cilexetil-hctz Other (See Comments)    Hydrochlorothiazide Other (See Comments)  Shellfish allergy Other (See Comments) 03/24/2013   Betadine [povidone iodine] Itching 08/14/2022    Past Surgical History:  Procedure Laterality Date   ABDOMINAL AORTIC ENDOVASCULAR FENESTRATED STENT GRAFT N/A 08/16/2022   Procedure: ABDOMINAL AORTIC ENDOVASCULAR FENESTRATED STENT GRAFT;  Surgeon: Nada Libman, MD;  Location: [facility] OR;  Service: Vascular;  Laterality: N/A;   BIOPSY  11/12/2020   Procedure: BIOPSY;  Surgeon: Sherrilyn Rist, MD;  Location: [facility] ENDOSCOPY;  Service: Gastroenterology;;   BIOPSY  07/25/2021   Procedure: BIOPSY;  Surgeon: Jenel Lucks, MD;  Location: [facility] ENDOSCOPY;  Service: Gastroenterology;;   BIOPSY  12/24/2021   Procedure: BIOPSY;  Surgeon: Lemar Lofty., MD;   Location: [facility] ENDOSCOPY;  Service: Gastroenterology;;   BRONCHIAL BIOPSY  07/27/2021   Procedure: BRONCHIAL BIOPSIES;  Surgeon: Leslye Peer, MD;  Location: [facility] ENDOSCOPY;  Service: Cardiopulmonary;;   BRONCHIAL BRUSHINGS  07/27/2021   Procedure: BRONCHIAL BRUSHINGS;  Surgeon: Leslye Peer, MD;  Location: [facility] ENDOSCOPY;  Service: Cardiopulmonary;;   cataract surgery Bilateral 2018   removal of cataracts   COLONOSCOPY     hx polyps - Leone Payor 02/2012   COLONOSCOPY WITH PROPOFOL N/A 11/12/2020   Procedure: COLONOSCOPY WITH PROPOFOL;  Surgeon: Sherrilyn Rist, MD;  Location: [facility] ENDOSCOPY;  Service: Gastroenterology;  Laterality: N/A;   CORONARY ANGIOPLASTY WITH STENT PLACEMENT     ENDOVASCULAR STENT INSERTION Bilateral 08/16/2022   Procedure: ENDOVASCULAR STENT GRAFT INSERTION OF BILATERAL RENAL ARTERIES;  Surgeon: Nada Libman, MD;  Location: [facility] OR;  Service: Vascular;  Laterality: Bilateral;   ESOPHAGOGASTRODUODENOSCOPY (EGD) WITH PROPOFOL N/A 11/12/2020   Procedure: ESOPHAGOGASTRODUODENOSCOPY (EGD) WITH PROPOFOL;  Surgeon: Sherrilyn Rist, MD;  Location: [facility] ENDOSCOPY;  Service: Gastroenterology;  Laterality: N/A;   ESOPHAGOGASTRODUODENOSCOPY (EGD) WITH PROPOFOL N/A 03/06/2021   Procedure: ESOPHAGOGASTRODUODENOSCOPY (EGD) WITH PROPOFOL;  Surgeon: Benancio Deeds, MD;  Location: [facility] ENDOSCOPY;  Service: Gastroenterology;  Laterality: N/A;   ESOPHAGOGASTRODUODENOSCOPY (EGD) WITH PROPOFOL N/A 07/25/2021   Procedure: ESOPHAGOGASTRODUODENOSCOPY (EGD) WITH PROPOFOL;  Surgeon: Jenel Lucks, MD;  Location: [facility] ENDOSCOPY;  Service: Gastroenterology;  Laterality: N/A;   ESOPHAGOGASTRODUODENOSCOPY (EGD) WITH PROPOFOL N/A 12/24/2021   Procedure: ESOPHAGOGASTRODUODENOSCOPY (EGD) WITH PROPOFOL;  Surgeon: Meridee Score Netty Starring., MD;  Location: [facility] ENDOSCOPY;  Service: Gastroenterology;  Laterality: N/A;   flexible cystourethroscopy  05/24/06   HEMOSTASIS CLIP PLACEMENT  03/06/2021   Procedure:  HEMOSTASIS CLIP PLACEMENT;  Surgeon: Benancio Deeds, MD;  Location: [facility] ENDOSCOPY;  Service: Gastroenterology;;   HEMOSTASIS CLIP PLACEMENT  12/24/2021   Procedure: HEMOSTASIS CLIP PLACEMENT;  Surgeon: Lemar Lofty., MD;  Location: [facility] ENDOSCOPY;  Service: Gastroenterology;;   HOT HEMOSTASIS N/A 03/06/2021   Procedure: HOT HEMOSTASIS (ARGON PLASMA COAGULATION/BICAP);  Surgeon: Benancio Deeds, MD;  Location: [facility] ENDOSCOPY;  Service: Gastroenterology;  Laterality: N/A;   INGUINAL HERNIA REPAIR  2005   INSERTION OF ILIAC STENT Bilateral 08/16/2022   Procedure: INSERTION OF ILIAC STENT RIGHT EXTERNAL ARTERY, LEFT COMMON ARTERY, AND RIGHT COMMON ARTERY;  Surgeon: Nada Libman, MD;  Location: [facility] OR;  Service: Vascular;  Laterality: Bilateral;   IR CATHETER TUBE CHANGE  07/08/2022   LUNG LOBECTOMY Right 11/07   RUL   MULTIPLE TOOTH EXTRACTIONS     POLYPECTOMY  11/12/2020   Procedure: POLYPECTOMY;  Surgeon: Sherrilyn Rist, MD;  Location: [facility] ENDOSCOPY;  Service: Gastroenterology;;   POLYPECTOMY  12/24/2021   Procedure: POLYPECTOMY;  Surgeon: Lemar Lofty., MD;  Location: [facility] ENDOSCOPY;  Service:  Gastroenterology;;   SUBMUCOSAL LIFTING INJECTION  12/24/2021   Procedure: SUBMUCOSAL LIFTING INJECTION;  Surgeon: Lemar Lofty., MD;  Location: [facility] ENDOSCOPY;  Service: Gastroenterology;;   TRANSURETHRAL RESECTION OF PROSTATE N/A 05/02/2021   Procedure: TRANSURETHRAL RESECTION OF THE PROSTATE (TURP);  Surgeon: Crista Elliot, MD;  Location: [facility] ORS;  Service: Urology;  Laterality: N/A;   ULTRASOUND GUIDANCE FOR VASCULAR ACCESS Bilateral 08/16/2022   Procedure: ULTRASOUND GUIDANCE FOR VASCULAR ACCESS;  Surgeon: Nada Libman, MD;  Location: [facility] OR;  Service: Vascular;  Laterality: Bilateral;   UPPER ESOPHAGEAL ENDOSCOPIC ULTRASOUND (EUS) N/A 12/24/2021   Procedure: UPPER ESOPHAGEAL ENDOSCOPIC ULTRASOUND (EUS);  Surgeon: Lemar Lofty., MD;  Location: [facility] ENDOSCOPY;   Service: Gastroenterology;  Laterality: N/A;   UPPER GASTROINTESTINAL ENDOSCOPY     VIDEO BRONCHOSCOPY  07/27/2021   Procedure: VIDEO BRONCHOSCOPY WITHOUT FLUORO;  Surgeon: Leslye Peer, MD;  Location: [facility] ENDOSCOPY;  Service: Cardiopulmonary;;     Family History: Unremarkable   Social:  Lives With: Alone  Occupation: Retired  Support: Self  Level of Function: Independent on all ADLs/ iADLS PCP: Dr Oswaldo Done  Substances: Denies Alcohol or any illict drugs. Smoke 1/2 pack of cigarettes daily  Designated Decision Maker: Self  Code Status: FULL    OBJECTIVE:   Physical Exam: Blood pressure (!) 148/75, pulse 80, temperature 97.9 F (36.6 C), temperature source Oral, resp. rate 20, height 6' (1.829 m), weight 61.2 kg, SpO2 100%.  Constitutional: well-appearing man,laying  in bed, in no acute distress Cardiovascular: RRR. Pulmonary/Chest: normal work of breathing on room air, lungs clear to auscultation bilaterally. No crackles appreciated Abdominal: soft, non-tender, non-distended. Suprapubic Cath in place with no surrounding erythema or warmth  MSK: normal bulk and tone. Euvolemic  Neurological: alert & oriented x 3 Skin: warm and dry Psych: normal mood and affect   Labs: CBC    Latest Ref Rng & Units 06/01/2023    2:20 AM 04/24/2023   12:24 AM 10/17/2022    3:36 AM  CBC  WBC 4.0 - 10.5 K/uL 6.6  4.5  5.3   Hemoglobin 13.0 - 17.0 g/dL 25.3  9.7  9.7   Hematocrit 39.0 - 52.0 % 36.3  31.8  31.1   Platelets 150 - 400 K/uL 121  86  143      CMP     Latest Ref Rng & Units 06/01/2023    2:20 AM 04/24/2023   12:24 AM 10/17/2022    3:36 AM  CMP  Glucose 70 - 99 mg/dL 664  92  78   BUN 8 - 23 mg/dL 15  24  12    Creatinine 0.61 - 1.24 mg/dL 4.03  4.74  2.59   Sodium 135 - 145 mmol/L 143  140  141   Potassium 3.5 - 5.1 mmol/L 3.2  3.9  3.7   Chloride 98 - 111 mmol/L 108  106  111   CO2 22 - 32 mmol/L 26  23  22    Calcium 8.9 - 10.3 mg/dL 8.9  8.7  8.2      Imaging: DG  Chest Portable 1 View Result Date: 06/01/2023 CLINICAL DATA:  Shortness of breath EXAM: PORTABLE CHEST 1 VIEW COMPARISON:  Chest x-ray 08/16/2022 FINDINGS: Patient is rotated. The heart is enlarged, unchanged. There is central interstitial prominence bilaterally. There some atelectatic changes in the lung bases. There is no pleural effusion or pneumothorax. No acute fractures are seen. IMPRESSION: Cardiomegaly with central interstitial prominence bilaterally, likely pulmonary edema. Electronically  Signed   By: Darliss Cheney M.D.   On: 06/01/2023 02:30    EKG: personally reviewed no acute changes compared to prior  ASSESSMENT & PLAN:   Assessment & Plan by Problem: Principal Problem:   Flash pulmonary edema (HCC) Active Problems:   Tobacco use disorder   Essential hypertension   CKD (chronic kidney disease) stage 2, GFR 60-89 ml/min   AAA (abdominal aortic aneurysm) (HCC)   Chronic use of opiate for therapeutic purpose   Urinary retention   Protein-calorie malnutrition, severe (HCC)   Atrial fibrillation (HCC)   Emphysema lung (HCC)   [name] is a 77 y.o. person living with a history of Emphysema, HTN, CHF  who presented with SOB and admitted for pulmonary edema.  #Pulmonary Edema Presented with acute shortness of breath with no associated cough,chills of fever. Differentials include COPD vs CHF excerebration, anemia,asthma , panic attack, pneumonia ,PE or ACS . Denies cough,chest tightness or any sputum production, less concerns for COPD exacerbation. Afebrile and CXR didn't who any evidence of intrapulmonary infectious processes, unlikely pneumonia.Description of discomfort is atypical for aortic dissection, no pain per se, just dyspnea.  Thus we will defer CT angiogram.Patient's SOB resolved after BiPAP,euvolemic,no JVD and normal heart sounds, makes CHF exacerbation less likely even although it can't be ruled out completely with BNP of 645.Evidence of pulmonary edema noted on  CXR and with history of uncontrolled hypertension makes flash pulmonary edema mostly likely. Patient feels like he is back to his baseline after treatment in the ED with BiPAP, Lasix, and Nitropaste. Lungs are without crackles now.  Maintaining saturation between 88 and 92% on low-flow oxygen via nasal cannula. - Monitor BP closely  - Consider optimizing antihypertensives   #Demand Ischemia Troponin of 80 on arrival and 136 on repeat but description of chest pain makes ACS less likely and no ongoing chest pain is reassuring. - Trend trops until peak - Repeat EKG  - Consider consult Cardiology  #Hypertension On low-dose of amlodipine and losartan outside of the hospital.  Will continue these medicines.  Status post nitroglycerin paste in the emergency department, which has helped, BP now in the 140s systolic, equal in both arms.  This person may need PRNs for hypertension because I think this probably contributed to his presentation this evening.  #Chronic hip pain Takes hydrocodone twice a day for this.  Will continue for this while admitted.  #Hx ASCVD with AAA and CAD Status post endovascular aortic graft and PCI.  On aspirin clopidogrel which we will continue.  Also continue statin.  #Hx Atrial fibrillation No A-fib at present.  Rate is normal.  Not on anticoagulation because of bleeding complications, also on DAPT currently.  #History of emphysema Based on imaging.  No PFTs on file.  Diminished lung sounds throughout, but no wheezing.  Do not think reactive airway disease is at play here.  Probably has an element of pulmonary hypertension with loud P2 and long smoking history.  #Urinary retention With suprapubic catheter that is normal-appearing,no surrounding erythema or warmth . No problems with this at present.  #Elevated creatinine History of CKD stage II.  GFR last 2 encounters over the last month is suggestive of CKD 3 A.  Note he was started on low-dose of ARB which comes with  concomitant creatinine rise, to be expected.  I do not think elevated creatinine is is reflective of true AKI at present.  Monitor BMP.  Going to restart ARB per above.  Diet:  Regular Diet  VTE: Enoxaparin IVF: None,None Code: Full  Prior to Admission Living Arrangement: Home, living alone  Anticipated Discharge Location: Home Barriers to Discharge: Medical work up and treatment   Dispo: Admit patient to Inpatient with expected length of stay greater than 2 midnights.  Signed: Kathleen Lime, MD Internal Medicine Resident PGY-1  06/01/2023, 5:59 AM

## 2023-06-01 NOTE — ED Triage Notes (Signed)
Patient BIBA from home with sudden onset of SOB while laying down. 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 Denies chest pain. Patient was cool and diaphoretic on EMS arrival. RA-70% RA and tachypnic at 42 with EMS. EMS gave albuterol neb and 125 mg of solumedrol. 20 gauge in LFA.

## 2023-06-01 NOTE — Progress Notes (Signed)
RT at bedside to assess pt this AM. RT found pt on [place] in no distress. RT removed BiPAP (V60) from room. Order is PRN.

## 2023-06-01 NOTE — ED Notes (Signed)
ED TO INPATIENT HANDOFF REPORT  ED Nurse Name and Phone #: Trinna Post, RN [phone]  S Name/Age/Gender [name] 77 y.o. male Room/Bed: 034C/034C  Code Status   Code Status: Prior  Home/SNF/Other Home Patient oriented to: self, place, time, and situation Is this baseline? Yes   Triage Complete: Triage complete  Chief Complaint [facility]  Triage Note Patient BIBA from home with sudden onset of SOB while laying down. 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 Denies chest pain. Patient was cool and diaphoretic on EMS arrival. RA-70% RA and tachypnic at 42 with EMS. EMS gave albuterol neb and 125 mg of solumedrol. 20 gauge in LFA.    Allergies Allergies  Allergen Reactions   Candesartan Cilexetil-Hctz Other (See Comments)    Acute renal failure   Hydrochlorothiazide Other (See Comments)    Acute renal failure   Shellfish Allergy Other (See Comments)    Gout occurs   Betadine [Povidone Iodine] Itching    Level of Care/Admitting Diagnosis ED Disposition     ED Disposition  Admit   Condition  --   Comment  The patient appears reasonably stabilized for admission considering the current resources, flow, and capabilities available in the ED at this time, and I doubt any other [facility] requiring further screening and/or treatment in the ED prior to admission is  present.          B Medical/Surgery History Past Medical History:  Diagnosis Date   AAA (abdominal aortic aneurysm) (HCC)    followed by vasc surgery   Alcohol abuse    Barretts esophagus    bx distal esophagus 2011 neg    Bladder outlet obstruction 04/02/2018   CAD in native artery    a. s/p stent to prox RCA and mid Cx.   Cataract    Cholelithiasis    Chronic osteomyelitis involving ankle and foot, right (HCC) 07/13/2018   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   2006 - 3 adenomas largest 12 mm  2008 - no polyps 2013-  2 mm cecal polyp, adenoma, routine repeat colonoscopy 02/2017 approx 05/14/2017 18 polyps max 12 mm ALL  adenomas recall 2019 Iva Boop, MD, [facility]      Dieulafoy lesion of duodenum    Diverticulosis of colon    DJD (degenerative joint disease)    left hip   Full dentures    upper and lower   GERD (gastroesophageal reflux disease)    diet controlled    Gout    feet   Hiatal hernia    2 cm noted on upper endos 2011    Hx of adenomatous colonic polyps    Hyperlipidemia    Hypertension    Internal hemorrhoid    Lung cancer (HCC)    squamous cell ([id number]), 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 pt was on Plavix x 3 years then transitioned to ASA, no problems since   Non-small cell carcinoma of right lung, stage 1 (HCC) 01/30/2016   Renal failure, acute (HCC)    hx of 2/2 medication, resolved   Tobacco abuse    smoker .5 ppd x 55 yrs   UTI (lower urinary tract infection)    Past Surgical History:  Procedure Laterality Date   ABDOMINAL AORTIC ENDOVASCULAR FENESTRATED STENT GRAFT N/A 08/16/2022   Procedure: ABDOMINAL AORTIC ENDOVASCULAR FENESTRATED STENT GRAFT;  Surgeon: Nada Libman, MD;  Location: [facility] OR;  Service: Vascular;  Laterality:  N/A;   BIOPSY  11/12/2020   Procedure: BIOPSY;  Surgeon: Sherrilyn Rist, MD;  Location: [facility] ENDOSCOPY;  Service: Gastroenterology;;   BIOPSY  07/25/2021   Procedure: BIOPSY;  Surgeon: Jenel Lucks, MD;  Location: [facility] ENDOSCOPY;  Service: Gastroenterology;;   BIOPSY  12/24/2021   Procedure: BIOPSY;  Surgeon: Lemar Lofty., MD;  Location: [facility] ENDOSCOPY;  Service: Gastroenterology;;   BRONCHIAL BIOPSY  07/27/2021   Procedure: BRONCHIAL BIOPSIES;  Surgeon: Leslye Peer, MD;  Location: [facility] ENDOSCOPY;  Service: Cardiopulmonary;;   BRONCHIAL BRUSHINGS  07/27/2021   Procedure: BRONCHIAL BRUSHINGS;  Surgeon: Leslye Peer, MD;  Location: [facility] ENDOSCOPY;  Service: Cardiopulmonary;;   cataract surgery Bilateral 2018   removal of cataracts   COLONOSCOPY     hx polyps - Leone Payor  02/2012   COLONOSCOPY WITH PROPOFOL N/A 11/12/2020   Procedure: COLONOSCOPY WITH PROPOFOL;  Surgeon: Sherrilyn Rist, MD;  Location: [facility] ENDOSCOPY;  Service: Gastroenterology;  Laterality: N/A;   CORONARY ANGIOPLASTY WITH STENT PLACEMENT     ENDOVASCULAR STENT INSERTION Bilateral 08/16/2022   Procedure: ENDOVASCULAR STENT GRAFT INSERTION OF BILATERAL RENAL ARTERIES;  Surgeon: Nada Libman, MD;  Location: [facility] OR;  Service: Vascular;  Laterality: Bilateral;   ESOPHAGOGASTRODUODENOSCOPY (EGD) WITH PROPOFOL N/A 11/12/2020   Procedure: ESOPHAGOGASTRODUODENOSCOPY (EGD) WITH PROPOFOL;  Surgeon: Sherrilyn Rist, MD;  Location: [facility] ENDOSCOPY;  Service: Gastroenterology;  Laterality: N/A;   ESOPHAGOGASTRODUODENOSCOPY (EGD) WITH PROPOFOL N/A 03/06/2021   Procedure: ESOPHAGOGASTRODUODENOSCOPY (EGD) WITH PROPOFOL;  Surgeon: Benancio Deeds, MD;  Location: [facility] ENDOSCOPY;  Service: Gastroenterology;  Laterality: N/A;   ESOPHAGOGASTRODUODENOSCOPY (EGD) WITH PROPOFOL N/A 07/25/2021   Procedure: ESOPHAGOGASTRODUODENOSCOPY (EGD) WITH PROPOFOL;  Surgeon: Jenel Lucks, MD;  Location: [facility] ENDOSCOPY;  Service: Gastroenterology;  Laterality: N/A;   ESOPHAGOGASTRODUODENOSCOPY (EGD) WITH PROPOFOL N/A 12/24/2021   Procedure: ESOPHAGOGASTRODUODENOSCOPY (EGD) WITH PROPOFOL;  Surgeon: Meridee Score Netty Starring., MD;  Location: [facility] ENDOSCOPY;  Service: Gastroenterology;  Laterality: N/A;   flexible cystourethroscopy  05/24/06   HEMOSTASIS CLIP PLACEMENT  03/06/2021   Procedure: HEMOSTASIS CLIP PLACEMENT;  Surgeon: Benancio Deeds, MD;  Location: [facility] ENDOSCOPY;  Service: Gastroenterology;;   HEMOSTASIS CLIP PLACEMENT  12/24/2021   Procedure: HEMOSTASIS CLIP PLACEMENT;  Surgeon: Lemar Lofty., MD;  Location: [facility] ENDOSCOPY;  Service: Gastroenterology;;   HOT HEMOSTASIS N/A 03/06/2021   Procedure: HOT HEMOSTASIS (ARGON PLASMA COAGULATION/BICAP);  Surgeon: Benancio Deeds, MD;  Location: [facility] ENDOSCOPY;  Service:  Gastroenterology;  Laterality: N/A;   INGUINAL HERNIA REPAIR  2005   INSERTION OF ILIAC STENT Bilateral 08/16/2022   Procedure: INSERTION OF ILIAC STENT RIGHT EXTERNAL ARTERY, LEFT COMMON ARTERY, AND RIGHT COMMON ARTERY;  Surgeon: Nada Libman, MD;  Location: [facility] OR;  Service: Vascular;  Laterality: Bilateral;   IR CATHETER TUBE CHANGE  07/08/2022   LUNG LOBECTOMY Right 11/07   RUL   MULTIPLE TOOTH EXTRACTIONS     POLYPECTOMY  11/12/2020   Procedure: POLYPECTOMY;  Surgeon: Sherrilyn Rist, MD;  Location: [facility] ENDOSCOPY;  Service: Gastroenterology;;   POLYPECTOMY  12/24/2021   Procedure: POLYPECTOMY;  Surgeon: Lemar Lofty., MD;  Location: [facility] ENDOSCOPY;  Service: Gastroenterology;;   SUBMUCOSAL LIFTING INJECTION  12/24/2021   Procedure: SUBMUCOSAL LIFTING INJECTION;  Surgeon: Lemar Lofty., MD;  Location: [facility] ENDOSCOPY;  Service: Gastroenterology;;   TRANSURETHRAL RESECTION OF PROSTATE N/A 05/02/2021   Procedure: TRANSURETHRAL RESECTION OF THE PROSTATE (TURP);  Surgeon: Crista Elliot, MD;  Location: [facility] ORS;  Service:  Urology;  Laterality: N/A;   ULTRASOUND GUIDANCE FOR VASCULAR ACCESS Bilateral 08/16/2022   Procedure: ULTRASOUND GUIDANCE FOR VASCULAR ACCESS;  Surgeon: Nada Libman, MD;  Location: [facility] OR;  Service: Vascular;  Laterality: Bilateral;   UPPER ESOPHAGEAL ENDOSCOPIC ULTRASOUND (EUS) N/A 12/24/2021   Procedure: UPPER ESOPHAGEAL ENDOSCOPIC ULTRASOUND (EUS);  Surgeon: Lemar Lofty., MD;  Location: [facility] ENDOSCOPY;  Service: Gastroenterology;  Laterality: N/A;   UPPER GASTROINTESTINAL ENDOSCOPY     VIDEO BRONCHOSCOPY  07/27/2021   Procedure: VIDEO BRONCHOSCOPY WITHOUT FLUORO;  Surgeon: Leslye Peer, MD;  Location: [facility] ENDOSCOPY;  Service: Cardiopulmonary;;     A IV Location/Drains/Wounds Patient Lines/Drains/Airways Status     Active Line/Drains/Airways     Name Placement date Placement time Site Days   Peripheral IV 06/01/23 Left;Posterior Forearm  06/01/23  0146  Forearm  less than 1   Suprapubic Catheter Latex 18 Fr. 07/08/22  1058  Latex  328            Intake/Output Last 24 hours No intake or output data in the 24 hours ending 06/01/23 0351  Labs/Imaging Results for orders placed or performed during the hospital encounter of 06/01/23 (from the past 48 hours)  CBC with Differential     Status: Abnormal   Collection Time: 06/01/23  2:20 AM  Result Value Ref Range   WBC 6.6 4.0 - 10.5 K/uL   RBC 3.70 (L) 4.22 - 5.81 MIL/uL   Hemoglobin 11.0 (L) 13.0 - 17.0 g/dL   HCT 56.3 (L) 87.5 - 64.3 %   MCV 98.1 80.0 - 100.0 fL   MCH 29.7 26.0 - 34.0 pg   MCHC 30.3 30.0 - 36.0 g/dL   RDW 32.9 (H) 51.8 - 84.1 %   Platelets 121 (L) 150 - 400 K/uL   nRBC 0.0 0.0 - 0.2 %   Neutrophils Relative % 75 %   Neutro Abs 5.0 1.7 - 7.7 K/uL   Lymphocytes Relative 16 %   Lymphs Abs 1.0 0.7 - 4.0 K/uL   Monocytes Relative 5 %   Monocytes Absolute 0.3 0.1 - 1.0 K/uL   Eosinophils Relative 2 %   Eosinophils Absolute 0.2 0.0 - 0.5 K/uL   Basophils Relative 1 %   Basophils Absolute 0.0 0.0 - 0.1 K/uL   Immature Granulocytes 1 %   Abs Immature Granulocytes 0.03 0.00 - 0.07 K/uL    Comment: Performed at [facility], [address], [place], [place] [address]  Basic metabolic panel     Status: Abnormal   Collection Time: 06/01/23  2:20 AM  Result Value Ref Range   Sodium 143 135 - 145 mmol/L   Potassium 3.2 (L) 3.5 - 5.1 mmol/L   Chloride 108 98 - 111 mmol/L   CO2 26 22 - 32 mmol/L   Glucose, Bld 114 (H) 70 - 99 mg/dL    Comment: Glucose reference range applies only to samples taken after fasting for at least 8 hours.   BUN 15 8 - 23 mg/dL   Creatinine, Ser 0.16 (H) 0.61 - 1.24 mg/dL   Calcium 8.9 8.9 - 01.0 mg/dL   GFR, Estimated 48 (L) >60 mL/min    Comment: (NOTE) Calculated using the CKD-EPI Creatinine Equation (2021)    Anion gap 9 5 - 15    Comment: Performed at [facility], [address] [place], [place] [address]   Troponin I (High Sensitivity)     Status: Abnormal   Collection Time: 06/01/23  2:20  AM  Result Value Ref Range   Troponin I (High Sensitivity) 80 (H) <18 ng/L    Comment: (NOTE) Elevated high sensitivity troponin I (hsTnI) values and significant  changes across serial measurements may suggest ACS but many other  chronic and acute conditions are known to elevate hsTnI results.  Refer to the "Links" section for chest pain algorithms and additional  guidance. Performed at [facility], [address], [place], [place] [address]   Brain natriuretic peptide     Status: Abnormal   Collection Time: 06/01/23  2:20 AM  Result Value Ref Range   B Natriuretic Peptide 645.8 (H) 0.0 - 100.0 pg/mL    Comment: Performed at [facility], [address], [place], [place] [address]  Resp panel by RT-PCR (RSV, Flu A&B, Covid) Anterior Nasal Swab     Status: None   Collection Time: 06/01/23  2:20 AM   Specimen: Anterior Nasal Swab  Result Value Ref Range   SARS Coronavirus 2 by RT PCR NEGATIVE NEGATIVE   Influenza A by PCR NEGATIVE NEGATIVE   Influenza B by PCR NEGATIVE NEGATIVE    Comment: (NOTE) The Xpert Xpress SARS-CoV-2/FLU/RSV plus assay is intended as an aid in the diagnosis of influenza from Nasopharyngeal swab specimens and should not be used as a sole basis for treatment. Nasal washings and aspirates are unacceptable for Xpert Xpress SARS-CoV-2/FLU/RSV testing.  Fact Sheet for Patients: BloggerCourse.com  Fact Sheet for Healthcare Providers: SeriousBroker.it  This test is not yet approved or cleared by the [place] FDA and has been authorized for detection and/or diagnosis of SARS-CoV-2 by FDA under an Emergency Use Authorization (EUA). This EUA will remain in effect (meaning this test can be used) for the duration of the COVID-19 declaration under Section 564(b)(1) of the Act, 21 U.S.C. section 360bbb-3(b)(1),  unless the authorization is terminated or revoked.     Resp Syncytial Virus by PCR NEGATIVE NEGATIVE    Comment: (NOTE) Fact Sheet for Patients: BloggerCourse.com  Fact Sheet for Healthcare Providers: SeriousBroker.it  This test is not yet approved or cleared by the [place] FDA and has been authorized for detection and/or diagnosis of SARS-CoV-2 by FDA under an Emergency Use Authorization (EUA). This EUA will remain in effect (meaning this test can be used) for the duration of the COVID-19 declaration under Section 564(b)(1) of the Act, 21 U.S.C. section 360bbb-3(b)(1), unless the authorization is terminated or revoked.  Performed at [facility], [address], [place], [place] [address]    DG Chest Portable 1 View Result Date: 06/01/2023 CLINICAL DATA:  Shortness of breath EXAM: PORTABLE CHEST 1 VIEW COMPARISON:  Chest x-ray 08/16/2022 FINDINGS: Patient is rotated. The heart is enlarged, unchanged. There is central interstitial prominence bilaterally. There some atelectatic changes in the lung bases. There is no pleural effusion or pneumothorax. No acute fractures are seen. IMPRESSION: Cardiomegaly with central interstitial prominence bilaterally, likely pulmonary edema. Electronically Signed   By: Darliss Cheney M.D.   On: 06/01/2023 02:30    Pending Labs Unresulted Labs (From admission, onward)    None       Vitals/Pain Today's Vitals   06/01/23 0229 06/01/23 0230 06/01/23 0300 06/01/23 0321  BP: (!) 156/86  (!) 148/75   Pulse: 80 79 80 80  Resp: 15 19 18 20   Temp:      TempSrc:      SpO2: 100% 100% 100% 100%  Weight:      Height:  PainSc:        Isolation Precautions No active isolations  Medications Medications  furosemide (LASIX) injection 40 mg (has no administration in time range)  albuterol (PROVENTIL) (2.5 MG/3ML) 0.083% nebulizer solution 5 mg (5 mg Nebulization Given 06/01/23 0229)   nitroGLYCERIN (NITROGLYN) 2 % ointment 1 inch (1 inch Topical Given 06/01/23 0222)  aspirin chewable tablet 324 mg (324 mg Oral Given 06/01/23 0217)    Mobility walks with device     Focused Assessments     R Recommendations: See Admitting Provider Note  Report given to:   Additional Notes:

## 2023-06-01 NOTE — ED Notes (Signed)
Per admitting MD can sat between 88-92% before intervention for O2

## 2023-06-01 NOTE — ED Notes (Signed)
Pt provided water.  

## 2023-06-01 NOTE — ED Provider Notes (Signed)
[place] EMERGENCY DEPARTMENT AT [facility] Provider Note   CSN: [id number] Arrival date & time: 06/01/23  0139     History  Chief Complaint  Patient presents with   Shortness of Breath    [name] is a 77 y.o. male.  The history is provided by the EMS personnel. The history is limited by the condition of the patient.  Shortness of Breath Severity:  Severe Onset quality:  Sudden Timing:  Constant Progression:  Unchanged Chronicity:  New Context: not URI   Relieved by:  Nothing Worsened by:  Nothing Ineffective treatments:  None tried Associated symptoms: wheezing   Associated symptoms: no abdominal pain and no fever   Patient with COPD with SOB at home tonight.  No CP.      Past Medical History:  Diagnosis Date   AAA (abdominal aortic aneurysm) (HCC)    followed by vasc surgery   Alcohol abuse    Barretts esophagus    bx distal esophagus 2011 neg    Bladder outlet obstruction 04/02/2018   CAD in native artery    a. s/p stent to prox RCA and mid Cx.   Cataract    Cholelithiasis    Chronic osteomyelitis involving ankle and foot, right (HCC) 07/13/2018   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   2006 - 3 adenomas largest 12 mm  2008 - no polyps 2013-  2 mm cecal polyp, adenoma, routine repeat colonoscopy 02/2017 approx 05/14/2017 18 polyps max 12 mm ALL adenomas recall 2019 Iva Boop, MD, [facility]      Dieulafoy lesion of duodenum    Diverticulosis of colon    DJD (degenerative joint disease)    left hip   Full dentures    upper and lower   GERD (gastroesophageal reflux disease)    diet controlled    Gout    feet   Hiatal hernia    2 cm noted on upper endos 2011    Hx of adenomatous colonic polyps    Hyperlipidemia    Hypertension    Internal hemorrhoid    Lung cancer (HCC)    squamous cell (T2N0MX), 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 pt was on Plavix x 3 years then  transitioned to ASA, no problems since   Non-small cell carcinoma of right lung, stage 1 (HCC) 01/30/2016   Renal failure, acute (HCC)    hx of 2/2 medication, resolved   Tobacco abuse    smoker .5 ppd x 55 yrs   UTI (lower urinary tract infection)      Home Medications Prior to Admission medications   Medication Sig Start Date End Date Taking? Authorizing Provider  albuterol (VENTOLIN HFA) 108 (90 Base) MCG/ACT inhaler INHALE 1-2 PUFFS BY MOUTH EVERY 6 HOURS AS NEEDED FOR WHEEZE OR SHORTNESS OF BREATH 12/09/22  Yes Tyson Alias, MD  amLODipine (NORVASC) 5 MG tablet Take 1 tablet (5 mg total) by mouth daily. 04/03/23 04/02/24 Yes Tyson Alias, MD  aspirin EC 81 MG tablet Take 1 tablet (81 mg total) by mouth daily. Swallow whole. Patient taking differently: Take 81 mg by mouth at bedtime. Swallow whole. 04/14/23  Yes Nada Libman, MD  clopidogrel (PLAVIX) 75 MG tablet TAKE 1 TABLET BY MOUTH EVERY DAY 02/14/23  Yes Tyson Alias, MD  escitalopram (LEXAPRO) 5 MG tablet TAKE 1 TABLET (5 MG TOTAL) BY MOUTH DAILY. 12/10/22 06/08/23 Yes  Tyson Alias, MD  HYDROcodone-acetaminophen [facility]) 7.5-325 MG tablet Take 1 tablet by mouth 2 (two) times daily as needed for moderate pain (pain score 4-6). 04/28/23  Yes Tyson Alias, MD  losartan (COZAAR) 50 MG tablet Take 1 tablet (50 mg total) by mouth daily. 05/05/23  Yes Tyson Alias, MD  mirtazapine (REMERON) 7.5 MG tablet Take 2 tablets (15 mg total) by mouth at bedtime. 10/07/22  Yes Tyson Alias, MD  Pancrelipase, Lip-Prot-Amyl, (ZENPEP) 20000-63000 units CPEP Take 3 capsules by mouth 3 (three) times daily before meals. 02/14/23  Yes Tyson Alias, MD  pantoprazole (PROTONIX) 40 MG tablet TAKE 1 TABLET EVERY DAY FOR REFLUX 10/21/22  Yes Tyson Alias, MD  rosuvastatin (CRESTOR) 40 MG tablet Take 1 tablet (40 mg total) by mouth daily. 04/03/23  Yes Tyson Alias, MD   senna (SENOKOT) 8.6 MG TABS tablet Take 2 tablets (17.2 mg total) by mouth in the morning and at bedtime. Patient taking differently: Take 1 tablet by mouth in the morning and at bedtime. 06/29/21  Yes Tyson Alias, MD      Allergies    Candesartan cilexetil-hctz, Hydrochlorothiazide, Shellfish allergy, and Betadine [povidone iodine]    Review of Systems   Review of Systems  Unable to perform ROS: Acuity of condition  Constitutional:  Negative for fever.  HENT:  Negative for facial swelling.   Respiratory:  Positive for shortness of breath and wheezing.   Gastrointestinal:  Negative for abdominal pain.    Physical Exam Updated Vital Signs BP (!) 140/73   Pulse 81   Temp 97.9 F (36.6 C) (Oral)   Resp (!) 25   Ht 6' (1.829 m)   Wt 61.2 kg   SpO2 (!) 88%   BMI 18.31 kg/m  Physical Exam Vitals and nursing note reviewed. Exam conducted with a chaperone present.  Constitutional:      General: He is in acute distress.     Appearance: He is well-developed. He is diaphoretic.  HENT:     Head: Normocephalic and atraumatic.     Nose: Nose normal.  Eyes:     Conjunctiva/sclera: Conjunctivae normal.     Pupils: Pupils are equal, round, and reactive to light.  Cardiovascular:     Rate and Rhythm: Normal rate and regular rhythm.     Pulses: Normal pulses.     Heart sounds: Normal heart sounds.  Pulmonary:     Effort: Respiratory distress present.     Breath sounds: Wheezing and rales present.  Abdominal:     General: Bowel sounds are normal.     Palpations: Abdomen is soft.     Tenderness: There is no abdominal tenderness. There is no guarding or rebound.  Musculoskeletal:        General: Normal range of motion.     Cervical back: Normal range of motion and neck supple.  Skin:    General: Skin is warm.     Capillary Refill: Capillary refill takes less than 2 seconds.  Neurological:     Mental Status: He is alert and oriented to person, place, and time.     Deep  Tendon Reflexes: Reflexes normal.     ED Results / Procedures / Treatments   Labs (all labs ordered are listed, but only abnormal results are displayed) Results for orders placed or performed during the hospital encounter of 06/01/23  Resp panel by RT-PCR (RSV, Flu A&B, Covid) Anterior Nasal Swab   Collection Time: 06/01/23  2:20 AM   Specimen: Anterior Nasal Swab  Result Value Ref Range   SARS Coronavirus 2 by RT PCR NEGATIVE NEGATIVE   Influenza A by PCR NEGATIVE NEGATIVE   Influenza B by PCR NEGATIVE NEGATIVE   Resp Syncytial Virus by PCR NEGATIVE NEGATIVE  CBC with Differential   Collection Time: 06/01/23  2:20 AM  Result Value Ref Range   WBC 6.6 4.0 - 10.5 K/uL   RBC 3.70 (L) 4.22 - 5.81 MIL/uL   Hemoglobin 11.0 (L) 13.0 - 17.0 g/dL   HCT 29.5 (L) 62.1 - 30.8 %   MCV 98.1 80.0 - 100.0 fL   MCH 29.7 26.0 - 34.0 pg   MCHC 30.3 30.0 - 36.0 g/dL   RDW 65.7 (H) 84.6 - 96.2 %   Platelets 121 (L) 150 - 400 K/uL   nRBC 0.0 0.0 - 0.2 %   Neutrophils Relative % 75 %   Neutro Abs 5.0 1.7 - 7.7 K/uL   Lymphocytes Relative 16 %   Lymphs Abs 1.0 0.7 - 4.0 K/uL   Monocytes Relative 5 %   Monocytes Absolute 0.3 0.1 - 1.0 K/uL   Eosinophils Relative 2 %   Eosinophils Absolute 0.2 0.0 - 0.5 K/uL   Basophils Relative 1 %   Basophils Absolute 0.0 0.0 - 0.1 K/uL   Immature Granulocytes 1 %   Abs Immature Granulocytes 0.03 0.00 - 0.07 K/uL  Basic metabolic panel   Collection Time: 06/01/23  2:20 AM  Result Value Ref Range   Sodium 143 135 - 145 mmol/L   Potassium 3.2 (L) 3.5 - 5.1 mmol/L   Chloride 108 98 - 111 mmol/L   CO2 26 22 - 32 mmol/L   Glucose, Bld 114 (H) 70 - 99 mg/dL   BUN 15 8 - 23 mg/dL   Creatinine, Ser 9.52 (H) 0.61 - 1.24 mg/dL   Calcium 8.9 8.9 - 84.1 mg/dL   GFR, Estimated 48 (L) >60 mL/min   Anion gap 9 5 - 15  Brain natriuretic peptide   Collection Time: 06/01/23  2:20 AM  Result Value Ref Range   B Natriuretic Peptide 645.8 (H) 0.0 - 100.0 pg/mL   Troponin I (High Sensitivity)   Collection Time: 06/01/23  2:20 AM  Result Value Ref Range   Troponin I (High Sensitivity) 80 (H) <18 ng/L   DG Chest Portable 1 View Result Date: 06/01/2023 CLINICAL DATA:  Shortness of breath EXAM: PORTABLE CHEST 1 VIEW COMPARISON:  Chest x-ray 08/16/2022 FINDINGS: Patient is rotated. The heart is enlarged, unchanged. There is central interstitial prominence bilaterally. There some atelectatic changes in the lung bases. There is no pleural effusion or pneumothorax. No acute fractures are seen. IMPRESSION: Cardiomegaly with central interstitial prominence bilaterally, likely pulmonary edema. Electronically Signed   By: Darliss Cheney M.D.   On: 06/01/2023 02:30    EKG EKG Interpretation Date/Time:  Sunday June 01 2023 02:09:34 EST Ventricular Rate:  81 PR Interval:    QRS Duration:  134 QT Interval:  414 QTC Calculation: 481 R Axis:   -62  Text Interpretation: Atrial fibrillation RBBB and LAFB Probable left ventricular hypertrophy Nonspecific T abnormalities, lateral leads Confirmed by Nicanor Alcon, Dreonna Hussein ([id number]) on 06/01/2023 2:11:11 AM  Radiology DG Chest Portable 1 View Result Date: 06/01/2023 CLINICAL DATA:  Shortness of breath EXAM: PORTABLE CHEST 1 VIEW COMPARISON:  Chest x-ray 08/16/2022 FINDINGS: Patient is rotated. The heart is enlarged, unchanged. There is central interstitial prominence bilaterally. There some atelectatic changes in the lung bases.  There is no pleural effusion or pneumothorax. No acute fractures are seen. IMPRESSION: Cardiomegaly with central interstitial prominence bilaterally, likely pulmonary edema. Electronically Signed   By: Darliss Cheney M.D.   On: 06/01/2023 02:30    Procedures .Critical Care  Performed by: Cy Blamer, MD Authorized by: Cy Blamer, MD   Critical care provider statement:    Critical care time (minutes):  90   Critical care end time:  06/01/2023 5:10 AM   Critical care was necessary to treat  or prevent imminent or life-threatening deterioration of the following conditions:  Circulatory failure and cardiac failure   Critical care was time spent personally by me on the following activities:  Re-evaluation of patient's condition, pulse oximetry, ordering and review of radiographic studies, ordering and review of laboratory studies, ordering and performing treatments and interventions, examination of patient, discussions with consultants and evaluation of patient's response to treatment   Care discussed with: admitting provider       Medications Ordered in ED Medications  enoxaparin (LOVENOX) injection 40 mg (has no administration in time range)  sodium chloride flush (NS) 0.9 % injection 3 mL (has no administration in time range)  albuterol (PROVENTIL) (2.5 MG/3ML) 0.083% nebulizer solution 5 mg (5 mg Nebulization Given 06/01/23 0229)  nitroGLYCERIN (NITROGLYN) 2 % ointment 1 inch (1 inch Topical Given 06/01/23 0222)  aspirin chewable tablet 324 mg (324 mg Oral Given 06/01/23 0217)  furosemide (LASIX) injection 40 mg (40 mg Intravenous Given 06/01/23 0409)    ED Course/ Medical Decision Making/ A&P                                 Medical Decision Making Acute SOB tonight   Amount and/or Complexity of Data Reviewed Independent Historian: EMS    Details: See above  External Data Reviewed: notes.    Details: Previous notes  Labs: ordered.    Details: Elevated troponin 80, elevated BNP 645.8, negative covid and flu. White count 6.6 normal hemoglobin 11, normal platelets. Normal sodium 143,  elevated creatinine 1.48  Radiology: ordered and independent interpretation performed.    Details: Pulmonary edema by me  ECG/medicine tests: ordered. Decision-making details documented in ED Course.  Risk OTC drugs. Prescription drug management. Decision regarding hospitalization. Risk Details: Markedly improved on the bipap    Final Clinical Impression(s) / ED Diagnoses Final  diagnoses:  COPD exacerbation (HCC)  Acute right-sided congestive heart failure (HCC)  Elevated troponin I level   The patient appears reasonably stabilized for admission considering the current resources, flow, and capabilities available in the ED at this time, and I doubt any other [facility] requiring further screening and/or treatment in the ED prior to admission.  Rx / DC Orders ED Discharge Orders     None         Likisha Alles, MD 06/01/23 [phone]

## 2023-06-02 ENCOUNTER — Observation Stay (HOSPITAL_BASED_OUTPATIENT_CLINIC_OR_DEPARTMENT_OTHER): Payer: 59

## 2023-06-02 ENCOUNTER — Encounter: Payer: 59 | Admitting: Student in an Organized Health Care Education/Training Program

## 2023-06-02 DIAGNOSIS — I5020 Unspecified systolic (congestive) heart failure: Secondary | ICD-10-CM | POA: Insufficient documentation

## 2023-06-02 DIAGNOSIS — N1831 Chronic kidney disease, stage 3a: Secondary | ICD-10-CM | POA: Diagnosis present

## 2023-06-02 DIAGNOSIS — K8689 Other specified diseases of pancreas: Secondary | ICD-10-CM | POA: Diagnosis present

## 2023-06-02 DIAGNOSIS — I509 Heart failure, unspecified: Secondary | ICD-10-CM | POA: Insufficient documentation

## 2023-06-02 DIAGNOSIS — J439 Emphysema, unspecified: Secondary | ICD-10-CM | POA: Diagnosis present

## 2023-06-02 DIAGNOSIS — I2510 Atherosclerotic heart disease of native coronary artery without angina pectoris: Secondary | ICD-10-CM | POA: Diagnosis present

## 2023-06-02 DIAGNOSIS — I130 Hypertensive heart and chronic kidney disease with heart failure and stage 1 through stage 4 chronic kidney disease, or unspecified chronic kidney disease: Secondary | ICD-10-CM | POA: Diagnosis present

## 2023-06-02 DIAGNOSIS — J9601 Acute respiratory failure with hypoxia: Secondary | ICD-10-CM | POA: Diagnosis present

## 2023-06-02 DIAGNOSIS — F32A Depression, unspecified: Secondary | ICD-10-CM | POA: Diagnosis present

## 2023-06-02 DIAGNOSIS — J4489 Other specified chronic obstructive pulmonary disease: Secondary | ICD-10-CM | POA: Diagnosis present

## 2023-06-02 DIAGNOSIS — E43 Unspecified severe protein-calorie malnutrition: Secondary | ICD-10-CM | POA: Diagnosis present

## 2023-06-02 DIAGNOSIS — I480 Paroxysmal atrial fibrillation: Secondary | ICD-10-CM | POA: Diagnosis present

## 2023-06-02 DIAGNOSIS — E785 Hyperlipidemia, unspecified: Secondary | ICD-10-CM | POA: Diagnosis present

## 2023-06-02 DIAGNOSIS — I5022 Chronic systolic (congestive) heart failure: Secondary | ICD-10-CM | POA: Insufficient documentation

## 2023-06-02 DIAGNOSIS — R339 Retention of urine, unspecified: Secondary | ICD-10-CM | POA: Diagnosis present

## 2023-06-02 DIAGNOSIS — I5023 Acute on chronic systolic (congestive) heart failure: Secondary | ICD-10-CM | POA: Diagnosis present

## 2023-06-02 DIAGNOSIS — Z79899 Other long term (current) drug therapy: Secondary | ICD-10-CM | POA: Diagnosis not present

## 2023-06-02 DIAGNOSIS — R0609 Other forms of dyspnea: Secondary | ICD-10-CM | POA: Diagnosis not present

## 2023-06-02 DIAGNOSIS — I5082 Biventricular heart failure: Secondary | ICD-10-CM | POA: Diagnosis present

## 2023-06-02 DIAGNOSIS — G8929 Other chronic pain: Secondary | ICD-10-CM | POA: Diagnosis present

## 2023-06-02 DIAGNOSIS — I714 Abdominal aortic aneurysm, without rupture, unspecified: Secondary | ICD-10-CM | POA: Diagnosis present

## 2023-06-02 DIAGNOSIS — I50811 Acute right heart failure: Secondary | ICD-10-CM | POA: Diagnosis present

## 2023-06-02 DIAGNOSIS — F17210 Nicotine dependence, cigarettes, uncomplicated: Secondary | ICD-10-CM | POA: Diagnosis present

## 2023-06-02 DIAGNOSIS — I21A1 Myocardial infarction type 2: Secondary | ICD-10-CM | POA: Diagnosis present

## 2023-06-02 DIAGNOSIS — I272 Pulmonary hypertension, unspecified: Secondary | ICD-10-CM | POA: Diagnosis present

## 2023-06-02 DIAGNOSIS — I4510 Unspecified right bundle-branch block: Secondary | ICD-10-CM | POA: Diagnosis present

## 2023-06-02 DIAGNOSIS — Z681 Body mass index (BMI) 19 or less, adult: Secondary | ICD-10-CM | POA: Diagnosis not present

## 2023-06-02 DIAGNOSIS — J810 Acute pulmonary edema: Secondary | ICD-10-CM | POA: Diagnosis present

## 2023-06-02 LAB — CBC
HCT: 30.8 % — ABNORMAL LOW (ref 39.0–52.0)
Hemoglobin: 9.6 g/dL — ABNORMAL LOW (ref 13.0–17.0)
MCH: 29.4 pg (ref 26.0–34.0)
MCHC: 31.2 g/dL (ref 30.0–36.0)
MCV: 94.2 fL (ref 80.0–100.0)
Platelets: 110 10*3/uL — ABNORMAL LOW (ref 150–400)
RBC: 3.27 MIL/uL — ABNORMAL LOW (ref 4.22–5.81)
RDW: 15.9 % — ABNORMAL HIGH (ref 11.5–15.5)
WBC: 7.5 10*3/uL (ref 4.0–10.5)
nRBC: 0 % (ref 0.0–0.2)

## 2023-06-02 LAB — BASIC METABOLIC PANEL
Anion gap: 6 (ref 5–15)
BUN: 26 mg/dL — ABNORMAL HIGH (ref 8–23)
CO2: 24 mmol/L (ref 22–32)
Calcium: 8.8 mg/dL — ABNORMAL LOW (ref 8.9–10.3)
Chloride: 107 mmol/L (ref 98–111)
Creatinine, Ser: 1.74 mg/dL — ABNORMAL HIGH (ref 0.61–1.24)
GFR, Estimated: 40 mL/min — ABNORMAL LOW (ref >60)
Glucose, Bld: 102 mg/dL — ABNORMAL HIGH (ref 70–99)
Potassium: 4.1 mmol/L (ref 3.5–5.1)
Sodium: 137 mmol/L (ref 135–145)

## 2023-06-02 LAB — ECHOCARDIOGRAM COMPLETE
AR max vel: 1.91 cm2
AV Area VTI: 2.33 cm2
AV Area mean vel: 1.92 cm2
AV Mean grad: 3 mm[Hg]
AV Peak grad: 5.2 mm[Hg]
Ao pk vel: 1.14 m/s
Area-P 1/2: 3.85 cm2
Height: 72 in
S' Lateral: 4.3 cm
Weight: 2190.49 [oz_av]

## 2023-06-02 MED ORDER — AMLODIPINE BESYLATE 10 MG PO TABS
10.0000 mg | ORAL_TABLET | Freq: Every day | ORAL | Status: DC
Start: 2023-06-03 — End: 2023-06-03
  Administered 2023-06-03: 10 mg via ORAL
  Filled 2023-06-02: qty 1

## 2023-06-02 MED ORDER — SACUBITRIL-VALSARTAN 49-51 MG PO TABS
1.0000 | ORAL_TABLET | Freq: Two times a day (BID) | ORAL | Status: DC
  Administered 2023-06-02 – 2023-06-03 (×2): 1 via ORAL
  Filled 2023-06-02 (×2): qty 1

## 2023-06-02 NOTE — Plan of Care (Signed)

## 2023-06-02 NOTE — Progress Notes (Signed)
   06/02/23 2025  BiPAP/CPAP/SIPAP  BiPAP/CPAP/SIPAP Pt Type Adult  BiPAP/CPAP/SIPAP V60  BiPAP/CPAP /SiPAP Vitals  Pulse Rate (!) 56  Resp 18  SpO2 94 %   Pt has PRN Bipap orders, no distress noted

## 2023-06-02 NOTE — Plan of Care (Signed)
  Problem: Education: Goal: Knowledge of discharge needs will improve Outcome: Progressing   Problem: Clinical Measurements: Goal: Postoperative complications will be avoided or minimized Outcome: Progressing   Problem: Respiratory: Goal: Ability to achieve and maintain a regular respiratory rate will improve Outcome: Progressing

## 2023-06-02 NOTE — Progress Notes (Addendum)
HD#0 SUBJECTIVE:  Patient Summary: [name] is a 77 yo with a history of emphysema, CHF, GERD, Hyperlipidemia, HTN, non-small cell carcinoma of the right lung who presented to the ED due to sudden onset shortness of breath and was admitted to the Internal Medicine Teaching Service on 12/15 for flash pulmonary edema.     Overnight Events: N/A  Interim History: Patient reports feeling well today. He denies any acute complaints at the moment. He denies chest pain, SOB, abdominal pain.   OBJECTIVE:  Vital Signs: Vitals:   06/01/23 2100 06/02/23 0100 06/02/23 0324 06/02/23 0408  BP: (!) 162/93 113/70  (!) 154/81  Pulse:  (!) 52  61  Resp: 17 16  17   Temp: 98.8 F (37.1 C) 97.9 F (36.6 C)  97.9 F (36.6 C)  TempSrc: Oral Oral  Oral  SpO2: 93% 96%  93%  Weight:   62.1 kg   Height:         Intake/Output Summary (Last 24 hours) at 06/02/2023 0601 Last data filed at 06/02/2023 0409 Gross per 24 hour  Intake 177 ml  Output 900 ml  Net -723 ml   Net IO Since Admission: -1,673 mL [06/02/23 0601]  Physical Exam: Physical Exam: General:laying in bed, NAD Cardiac:regular rate and rhythm  Pulmonary:clear to auscultate bilaterally,  Abdominal:soft, non tender, bowel sounds present  Neuro:alert and oriented, moving all four extremities  MSK: no pitting edema BL LE  Skin:warm and dry Psych:  normal mood and affect     Latest Ref Rng & Units 06/02/2023    2:25 AM 06/01/2023    2:20 AM 04/24/2023   12:24 AM  CBC  WBC 4.0 - 10.5 K/uL 7.5  6.6  4.5   Hemoglobin 13.0 - 17.0 g/dL 9.6  60.4  9.7   Hematocrit 39.0 - 52.0 % 30.8  36.3  31.8   Platelets 150 - 400 K/uL 110  121  86       Latest Ref Rng & Units 06/02/2023    2:25 AM 06/01/2023    2:20 AM 04/24/2023   12:24 AM  BMP  Glucose 70 - 99 mg/dL 540  981  92   BUN 8 - 23 mg/dL 26  15  24    Creatinine 0.61 - 1.24 mg/dL 1.91  4.78  2.95   Sodium 135 - 145 mmol/L 137  143  140   Potassium 3.5 - 5.1 mmol/L 4.1  3.2  3.9    Chloride 98 - 111 mmol/L 107  108  106   CO2 22 - 32 mmol/L 24  26  23    Calcium 8.9 - 10.3 mg/dL 8.8  8.9  8.7    CXR 62/13/0865 : Cardiomegaly with central interstitial prominence bilaterally, likely pulmonary edema.  ASSESSMENT/PLAN:  Assessment: Principal Problem:   Flash pulmonary edema (HCC) Active Problems:   Tobacco use disorder   Essential hypertension   CKD (chronic kidney disease) stage 2, GFR 60-89 ml/min   AAA (abdominal aortic aneurysm) (HCC)   Chronic use of opiate for therapeutic purpose   Urinary retention   Protein-calorie malnutrition, severe (HCC)   Atrial fibrillation (HCC)   Emphysema lung (HCC)   Elevated troponin I level   Plan: #Flash Pulmonary Edema 2/2 HTN #Chronically Uncontrolled HTN  Patient remains hypertensive, will increase his regimen today  Plan: - Increase amlodipine to 10 mg - Add Entresto 49-51 mg, first dose this evening  - D/c losartan  #Demand ischemia- resolved #HFmrEF Patient with initial complaint of  diffuse chest pain/discomfort. Noted to have cardiomegaly on CXR, BMP 645. Does have significant CV history including HTN, Afib, AAA s/p repair, and CAD. Last TTE in 2022 with LVEF 55-60% and G1DD. No CP or signs of volume overload on morning exam. Troponins peaked (124 > 136 > 80, low concern for ACS at this time, likely elevated in the setting of demand ischemia.  -Echocardiogram showed LVEF 45-50% with regional wall motion abnormalities  -Low suspicion for ACS, patient denies symptoms and the abnormalities can be explained from HTN vs arrhythmogenic cardiomyopathy, will begin patient on entresto and cardiology outpatient follow up, patient is agreeable to plans   #History of AAA and CAD S/p AAA repair March 2024, follows with vascular surgery. On DAPT. Resumed Aspirin 81 mg daily, Plavix 75 mg daily, and rosuvastatin 40 mg daily.   #Paroxysmal atrial fibrillation  Afib present on EKG and physical exam. Rate controlled here.  Asymptomatic. 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 Also previously on metoprolol 100 mg daily but this was held around May 2024 due to low blood pressure concerns. Will monitor.  -Avoid restarting metoprolol due to bradycardia   Chronic hip pain Continued hydrocodone-acetaminophen (Norco) 7.5-325 mg BID PRN on admission.    Chronic pancreatic insufficiency  Takes Zenpep tablets with meals. Ordered CREON TID with meals on admission.    CKD 2 History of CKD stage II. Worsening GFR (48, 47) suggestive of progression to CKD 3a. Started on low dose ARB by PCP, expected rise in Cr. Low concern for AKI. Will monitor daily BMP.  -Cr 1.74 from 1.48    Bladder outlet obstruction History of urinary retention  Suprapubic catheter in place, clean site. Per patient, site is assessed on a monthly basis and his catheter was recently changed out about 3 months ago. No tenderness to palpation. Will monitor.    Psychiatric medications for mood/appetite History of depression and food aversion. Resumed home escitalopram 5 mg daily and mirtazapine 15 mg daily at bedtime on admission.     Best Practice: Diet: Cardiac Diet  VTE: enoxaparin (LOVENOX) injection 40 mg Start: 06/01/23 1000 Code: Full   Signature: Faith Rogue, D.O.  Internal Medicine Resident, PGY-1 [facility]  Pager: [phone] 6:01 AM, 06/02/2023   Please contact the on call pager after 5 pm and on weekends at [phone].

## 2023-06-02 NOTE — Care Management Obs Status (Signed)
MEDICARE OBSERVATION STATUS NOTIFICATION   Patient Details  Name: GRADON GERATY MRN: [id number] Date of Birth: [birth year]   Medicare Observation Status Notification Given:  Yes    Ronny Bacon, RN 06/02/2023, 7:40 AM

## 2023-06-02 NOTE — TOC Initial Note (Addendum)
Transition of Care [facility]) - Initial/Assessment Note    Patient Details  Name: [name] MRN: [id number] Date of Birth: Oct 04, 1945  [facility] CM/SW Contact:    Leone Haven, RN Phone Number: 06/02/2023, 2:22 PM  Clinical Narrative:                 From home alone, , has PCP , Dr. Oswaldo Done  at [facility]) and insurance on file, states has no HH services in place at this time, has a walker and a cane at home.  States brother  will transport him  home at [employer] and family is support system, states [facility] [address].  Pta self ambulatory with cane. He states [employer] also sets up transportation for him to go to his appts.   Expected Discharge Plan: Home/Self Care Barriers to Discharge: Continued Medical Work up   Patient Goals and CMS Choice Patient states their goals for this hospitalization and ongoing recovery are:: return home   Choice offered to / list presented to : NA      Expected Discharge Plan and Services In-house Referral: NA Discharge Planning Services: CM Consult Post Acute Care Choice: NA Living arrangements for the past 2 months: Single Family Home                 DME Arranged: N/A DME Agency: NA       HH Arranged: NA          Prior Living Arrangements/Services Living arrangements for the past 2 months: Single Family Home Lives with:: Self Patient language and need for interpreter reviewed:: Yes Do you feel safe going back to the place where you live?: Yes      Need for Family Participation in Patient Care: No (Comment) Care giver support system in place?: Yes (comment) Current home services: DME (walker, cane) Criminal Activity/Legal Involvement Pertinent to Current Situation/Hospitalization: No - Comment as needed  Activities of Daily Living   ADL Screening (condition at time of admission) Independently performs ADLs?: Yes (appropriate for developmental age) Is the patient deaf or have difficulty hearing?:  No Does the patient have difficulty seeing, even when wearing glasses/contacts?: No Does the patient have difficulty concentrating, remembering, or making decisions?: No  Permission Sought/Granted Permission sought to share information with : Case Manager Permission granted to share information with : Yes, Verbal Permission Granted              Emotional Assessment Appearance:: Appears stated age Attitude/Demeanor/Rapport: Engaged Affect (typically observed): Appropriate Orientation: : Oriented to Self, Oriented to Place, Oriented to  Time, Oriented to Situation Alcohol / Substance Use: Not Applicable Psych Involvement: No (comment)  Admission diagnosis:  Acute right-sided congestive heart failure (HCC) [I50.811] Flash pulmonary edema (HCC) [J81.0] COPD exacerbation (HCC) [J44.1] Elevated troponin I level [R79.89] Patient Active Problem List   Diagnosis Date Noted   HFrEF (heart failure with reduced ejection fraction) (HCC) 06/02/2023   Flash pulmonary edema (HCC) 06/01/2023   Elevated troponin I level 06/01/2023   S/P AAA repair 08/16/2022   Emphysema lung (HCC) 03/05/2022   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   Atrial fibrillation (HCC) 03/05/2021   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   Protein-calorie malnutrition, severe (HCC) 11/07/2020   Urinary retention 04/02/2018   Mild alcohol use disorder 12/09/2016   Premature atrial contractions 07/31/2015   Chronic use of opiate for therapeutic purpose 05/01/2015   Atherosclerosis of aorta (HCC)  01/13/2015   Prediabetes 12/30/2014   AAA (abdominal aortic aneurysm) (HCC)    Healthcare maintenance 06/30/2012   ASCVD (arteriosclerotic cardiovascular disease) 03/17/2012   Erectile dysfunction 10/12/2010   Arthropathy of pelvic region and thigh 05/02/2008   CKD (chronic kidney disease) stage 2, GFR 60-89 ml/min 08/07/2007   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    Gout 04/18/2006   Tobacco use disorder 04/18/2006   Essential hypertension 04/18/2006   GERD 04/18/2006   PCP:  Tyson Alias, MD Pharmacy:   [facility] [phone] [place], [place] - [address] AT [facility] [address] [place] [address] Phone: [phone] Fax: [phone]  [facility] - [place], [place] - [address] [place] [address] Phone: [phone] Fax: [phone]     Social Drivers of Health (SDOH) Social History: SDOH Screenings   Food Insecurity: No Food Insecurity (06/01/2023)  Housing: Low Risk  (06/01/2023)  Transportation Needs: No Transportation Needs (06/01/2023)  Utilities: Not At Risk (06/01/2023)  Alcohol Screen: Low Risk  (09/10/2022)  Depression (PHQ2-9): High Risk (09/10/2022)  Financial Resource Strain: Low Risk  (09/10/2022)  Recent Concern: Financial Resource Strain - Medium Risk (08/19/2022)  Physical Activity: Inactive (09/10/2022)  Social Connections: Moderately Isolated (09/10/2022)  Stress: No Stress Concern Present (09/10/2022)  Tobacco Use: High Risk (06/01/2023)   SDOH Interventions:     Readmission Risk Interventions     No data to display

## 2023-06-03 ENCOUNTER — Other Ambulatory Visit (HOSPITAL_COMMUNITY): Payer: Self-pay

## 2023-06-03 DIAGNOSIS — J810 Acute pulmonary edema: Secondary | ICD-10-CM | POA: Diagnosis not present

## 2023-06-03 LAB — BASIC METABOLIC PANEL
Anion gap: 7 (ref 5–15)
BUN: 25 mg/dL — ABNORMAL HIGH (ref 8–23)
CO2: 27 mmol/L (ref 22–32)
Calcium: 8.8 mg/dL — ABNORMAL LOW (ref 8.9–10.3)
Chloride: 104 mmol/L (ref 98–111)
Creatinine, Ser: 1.48 mg/dL — ABNORMAL HIGH (ref 0.61–1.24)
GFR, Estimated: 48 mL/min — ABNORMAL LOW (ref >60)
Glucose, Bld: 94 mg/dL (ref 70–99)
Potassium: 4 mmol/L (ref 3.5–5.1)
Sodium: 138 mmol/L (ref 135–145)

## 2023-06-03 LAB — CBC
HCT: 33.4 % — ABNORMAL LOW (ref 39.0–52.0)
Hemoglobin: 10.5 g/dL — ABNORMAL LOW (ref 13.0–17.0)
MCH: 29.9 pg (ref 26.0–34.0)
MCHC: 31.4 g/dL (ref 30.0–36.0)
MCV: 95.2 fL (ref 80.0–100.0)
Platelets: 109 10*3/uL — ABNORMAL LOW (ref 150–400)
RBC: 3.51 MIL/uL — ABNORMAL LOW (ref 4.22–5.81)
RDW: 16 % — ABNORMAL HIGH (ref 11.5–15.5)
WBC: 7.1 10*3/uL (ref 4.0–10.5)
nRBC: 0 % (ref 0.0–0.2)

## 2023-06-03 MED ORDER — SACUBITRIL-VALSARTAN 49-51 MG PO TABS
1.0000 | ORAL_TABLET | Freq: Two times a day (BID) | ORAL | 2 refills | Status: DC
  Filled 2023-06-03: qty 60, 30d supply, fill #0

## 2023-06-03 MED ORDER — AMLODIPINE BESYLATE 5 MG PO TABS: 10.0000 mg | ORAL_TABLET | Freq: Every day | ORAL | Status: DC

## 2023-06-03 NOTE — Discharge Summary (Signed)
Name: [name] MRN: [id number] DOB: July 07, 1945 77 y.o. PCP: [name] Alias, MD  Date of Admission: 06/01/2023  1:39 AM Date of Discharge: 06/03/2023 11:50 AM Attending Physician: Dr. Antony [name]  Discharge Diagnosis: Principal Problem:   Flash pulmonary edema (HCC) Active Problems:   Tobacco use disorder   Essential hypertension   CKD (chronic kidney disease) stage 2, GFR 60-89 ml/min   AAA (abdominal aortic aneurysm) (HCC)   Chronic use of opiate for therapeutic purpose   Urinary retention   Protein-calorie malnutrition, severe (HCC)   Atrial fibrillation (HCC)   Emphysema lung (HCC)   Elevated troponin I level   HFrEF (heart failure with reduced ejection fraction) (HCC)    Discharge Medications: Allergies as of 06/03/2023       Reactions   Candesartan Cilexetil-hctz Other (See Comments)   Acute renal failure   Hydrochlorothiazide Other (See Comments)   Acute renal failure   Shellfish Allergy Other (See Comments)   Gout occurs   Betadine [povidone Iodine] Itching        Medication List     STOP taking these medications    losartan 50 MG tablet Commonly known as: COZAAR       TAKE these medications    albuterol 108 (90 Base) MCG/ACT inhaler Commonly known as: VENTOLIN HFA INHALE 1-2 PUFFS BY MOUTH EVERY 6 HOURS AS NEEDED FOR WHEEZE OR SHORTNESS OF BREATH   amLODipine 5 MG tablet Commonly known as: NORVASC Take 2 tablets (10 mg total) by mouth daily. What changed: how much to take   aspirin EC 81 MG tablet Take 1 tablet (81 mg total) by mouth daily. Swallow whole. What changed: when to take this   clopidogrel 75 MG tablet Commonly known as: PLAVIX TAKE 1 TABLET BY MOUTH EVERY DAY   Entresto 49-51 MG Generic drug: sacubitril-valsartan Take 1 tablet by mouth 2 (two) times daily.   escitalopram 5 MG tablet Commonly known as: LEXAPRO TAKE 1 TABLET (5 MG TOTAL) BY MOUTH DAILY.   HYDROcodone-acetaminophen 7.5-325 MG  tablet Commonly known as: NORCO Take 1 tablet by mouth 2 (two) times daily as needed for moderate pain (pain score 4-6).   mirtazapine 7.5 MG tablet Commonly known as: REMERON Take 2 tablets (15 mg total) by mouth at bedtime.   pantoprazole 40 MG tablet Commonly known as: PROTONIX TAKE 1 TABLET EVERY DAY FOR REFLUX   rosuvastatin 40 MG tablet Commonly known as: CRESTOR Take 1 tablet (40 mg total) by mouth daily.   senna 8.6 MG Tabs tablet Commonly known as: SENOKOT Take 2 tablets (17.2 mg total) by mouth in the morning and at bedtime. What changed: how much to take   Zenpep 20000-63000 units Cpep Generic drug: Pancrelipase (Lip-Prot-Amyl) Take 3 capsules by mouth 3 (three) times daily before meals.        Disposition and follow-up:   [name] was discharged from [name] in Good condition.  At the [name] follow up visit please address:  1.  Follow-up:  *Uncontrolled HTN, Pulmonary edema  -Ensure patient is adherent to new regimen of Entresto 49-51 mg and amlodipine 10mg   -Please ensure patient is not using losartan  -BMP in 7-10 days  *HFmrEF -Please ensure patient follows up with cardiologist in 2-4 weeks -Please ensure patient is adherent to entresto 49-51 mg BID   *CKD  -Please repeat BMP in 7-14 days after hospitalization discharge -Patient's Serum creatine is 1.48 on day of discharge  2.  Labs /  imaging needed at time of follow-up: CBC  3.  Pending labs/ test needing follow-up: N/A  4.  Medication Changes  STOPPED  -Losartan 50 mg    ADDED  -Entresto 49-51 mg      MODIFIED  -Amlodipine 10 mg      Follow-up Appointments:  Follow-up Information     [name] Follow up.   Contact information: [address] [place] [address] [phone]                [name] Course by problem list: Principal Problem:   Flash  pulmonary edema (HCC) Active Problems:   Tobacco use disorder   Essential hypertension   CKD (chronic kidney disease) stage 2, GFR 60-89 ml/min   AAA (abdominal aortic aneurysm) (HCC)   Chronic use of opiate for therapeutic purpose   Urinary retention   Protein-calorie malnutrition, severe (HCC)   Atrial fibrillation (HCC)   Emphysema lung (HCC)  Resolved Problems:   * No resolved [name] problems. *   Flash pulmonary edema Chronically Uncontrolled HTN HFmrEF Patient presented with flash pulmonary edema and was diuresed with IV lasix 40mg  . BP regimen was adjusted to amlodipine 10mg  and entresto 49-51 mg due to echocardiogram findings of HFmrEF. Flash pulmonary edema was thought to be due to uncontrolled HTN.  Echocardiogram also revealed regional wall motion abnormalities;however, the patient remained asymptomatic and the suspicion for ACS remained low. Patient was agreeable to follow up with cardiology outpatient and start entresto.   Demand Ischemia  Patient with initial complaint of diffuse chest pain/discomfort. Noted to have cardiomegaly on CXR, BMP 645. Does have significant CV history including HTN, Afib, AAA s/p repair, and CAD. Last TTE in 2022 with LVEF 55-60% and G1DD. No CP or signs of volume overload on morning exam. Troponins peaked (124 > 136 > 80, low concern for ACS at this time, likely elevated in the setting of demand ischemia. Echocardiogram showed LVEF 45-50% with regional wall motion abnormalities. Low suspicion for ACS, patient denies symptoms and the abnormalities can be explained from HTN vs arrhythmogenic cardiomyopathy, will begin patient on entresto and cardiology outpatient follow up, patient is agreeable to plans.    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  Remained on aspirin, clopidogrel, and  statin.  Paroxsymal Atrial fibrillation Patient remained asymptomatically from Afib. He did not require rate control while  hospitalized.  Not on anticoagulation because of bleeding complications, also on DAPT currently.  History of emphysema Based on imaging.  No PFTs on file, consider outpatient PFT studies.   Chronic hip pain Home regimen includes hydrocodone BID, regimen was continued while hospitalized.   Urinary retention With suprapubic catheter that is normal-appearing.    Elevated creatinine History of CKD stage II.  GFR last 2 encounters over the last month is suggestive of CKD 3 A.  Note he was started on low-dose of ARB and transitioned to Entresto 49-51 mg which comes with concomitant creatinine rise, to be expected. Serum creatinine was 1.48 on day of discharge, recommend outpatient BMP at next visit.   Psychiatric medications for mood/appetite History of depression and food aversion. Resumed home escitalopram 5 mg daily and mirtazapine 15 mg daily at bedtime.     Discharge Subjective: Patient reports feeling well today and is ready to go home. He denies shortness of breath, chest pain, or new complaints.   Discharge Exam:   Blood pressure 116/65,  pulse (!) 58, temperature 98.5 F (36.9 C), temperature source Oral, resp. rate 16, height 6' (1.829 m), weight 62.2 kg, SpO2 93%.  Constitutional: Well appearing, sitting in bed, in no acute distress HENT: normocephalic atraumatic, mucous membranes moist Cardiovascular: regular rate and rhythm, no m/r/g Pulmonary/Chest: normal work of breathing on room air, lungs clear to auscultation bilaterally. No crackles  Abdominal: soft, non-tender, non-distended.  Neurological: alert & oriented x 3 MSK: no gross abnormalities. No pitting edema Skin: warm and dry Psych: Normal mood and affect  Pertinent Labs, Studies, and Procedures:     Latest Ref Rng & Units 06/03/2023    2:38 AM 06/02/2023    2:25 AM 06/01/2023    2:20 AM  CBC  WBC 4.0 - 10.5 K/uL 7.1  7.5  6.6   Hemoglobin 13.0 - 17.0 g/dL 65.7  9.6  84.6   Hematocrit 39.0 - 52.0 % 33.4  30.8   36.3   Platelets 150 - 400 K/uL 109  110  121        Latest Ref Rng & Units 06/03/2023    2:38 AM 06/02/2023    2:25 AM 06/01/2023    2:20 AM  CMP  Glucose 70 - 99 mg/dL 94  962  952   BUN 8 - 23 mg/dL 25  26  15    Creatinine 0.61 - 1.24 mg/dL 8.41  3.24  4.01   Sodium 135 - 145 mmol/L 138  137  143   Potassium 3.5 - 5.1 mmol/L 4.0  4.1  3.2   Chloride 98 - 111 mmol/L 104  107  108   CO2 22 - 32 mmol/L 27  24  26    Calcium 8.9 - 10.3 mg/dL 8.8  8.8  8.9     ECHOCARDIOGRAM COMPLETE Result Date: 06/02/2023 IMPRESSIONS   1. Left ventricular ejection fraction, by estimation, is 45 to 50%. The left ventricle has mildly decreased function. The left ventricle demonstrates regional wall motion abnormalities with basal to mid inferior severe hypokinesis. Left ventricular diastolic parameters are indeterminate.   2. Right ventricular systolic function is mildly reduced. The right ventricular size is mildly enlarged. There is moderately elevated pulmonary artery systolic pressure. The estimated right ventricular systolic pressure is 57.6 mmHg.   3. Left atrial size was mild to moderately dilated.   4. The mitral valve is normal in structure. Mild mitral valve regurgitation. No evidence of mitral stenosis.   5. The aortic valve is tricuspid. Aortic valve regurgitation is not visualized. No aortic stenosis is present.  6. The inferior vena cava is normal in size with <50% respiratory variability, suggesting right atrial pressure of 8 mmHg.   7. The patient was in atrial fibrillation.    DG Chest Portable 1 View Result Date: 06/01/2023 CLINICAL DATA:  . IMPRESSION: Cardiomegaly with central interstitial prominence bilaterally, likely pulmonary edema. Electronically Signed   By: Darliss Cheney M.D.   On: 06/01/2023 02:30     Discharge Instructions: Discharge Instructions     (HEART FAILURE PATIENTS) Call MD:  Anytime you have any of the following symptoms: 1) 3 pound weight gain in 24 hours  or 5 pounds in 1 week 2) shortness of breath, with or without a dry hacking cough 3) swelling in the hands, feet or stomach 4) if you have to sleep on extra pillows at night in order to breathe.   Complete by: As directed    Call MD for:  difficulty breathing, headache or visual disturbances   Complete  by: As directed    Call MD for:  extreme fatigue   Complete by: As directed    Call MD for:  hives   Complete by: As directed    Call MD for:  persistant dizziness or light-headedness   Complete by: As directed    Call MD for:  persistant nausea and vomiting   Complete by: As directed    Call MD for:  redness, tenderness, or signs of infection (pain, swelling, redness, odor or green/yellow discharge around incision site)   Complete by: As directed    Call MD for:  severe uncontrolled pain   Complete by: As directed    Call MD for:  temperature >100.4   Complete by: As directed    Diet - low sodium heart healthy   Complete by: As directed    Discharge instructions   Complete by: As directed    You came to the [name] for shortness of breath and you were diagnosed with pulmonary edema (fluid in the lungs) due to uncontrolled hypertension.  We treated you with lasix and treating your blood pressure.      *For your chronically uncontrolled hypertension -We have started you on these following medications:             -Entresto (sacubitril/valsartan) 49-51 mg, please take this medication twice a day              -Increased amlodipine (Norvasc) to 10 mg    -We have stopped the following medications:             -Please stop taking Losartan (COZAAR) 50 mg    -Please see your PCP, family doctor on June 24 2023 at 9:45     *For your heart failure (HFmrEF) -We have started you on these following medications:             -Entresto (sacubitril/valsartan) 49-51 mg, please take this medication twice a day               -Please see cardiologist in 2-4 weeks -Call to make an appointment at  [name] A DEPT OF Eligh[facility]ideg[name] -[phone] phone number                         -[address] [place] [address]         Follow-up appointments: Please see your PCP, family doctor on June 24 2023 at 9:45 at the [name] Please visit cardiologist (heart doctor) in 2-4 weeks    If you have any questions or concerns please feel free to call: Internal medicine clinic at [phone]     If you have any of these following symptoms, please call us or seek care at an emergency department: -Chest Pain -Difficulty Breathing -Syncope (passing out) -Drooping of face -Slurred speech -Sudden weakness in your leg or arm -Fever -Chills     We are glad that you are feeling better, it was a pleasure to care for you!   Faith Rogue DO   Increase activity slowly   Complete by: As directed        Signed: Faith Rogue DO [facility] - PGY1 Pager: [phone] 06/03/2023, 11:50 AM    Please contact the on call pager after 5 pm and on weekends at [phone].

## 2023-06-03 NOTE — Discharge Instructions (Signed)
You came to the hospital for shortness of breath and you were diagnosed with pulmonary edema (fluid in the lungs) due to uncontrolled hypertension.  We treated you with lasix and treating your blood pressure.    *For your chronically uncontrolled hypertension -We have started you on these following medications:  -Entresto (sacubitril/valsartan) 49-51 mg, please take this medication twice a day   -Increased amlodipine (Norvasc) to 10 mg   -We have stopped the following medications:  -Please stop taking Losartan (COZAAR) 50 mg   -Please see your PCP, family doctor on June 24 2023 at 9:45   *For your heart failure (HFmrEF) -We have started you on these following medications:  -Entresto (sacubitril/valsartan) 49-51 mg, please take this medication twice a day    -Please see cardiologist in 2-4 weeks -Call to make an appointment at [facility] A DEPT OF Eligh[facility] Bridegroom [facility] -[phone] phone number    -[address] [place] [address]     Follow-up appointments: Please see your PCP, family doctor on June 24 2023 at 9:45 at the [facility] Please visit cardiologist (heart doctor) in 2-4 weeks   If you have any questions or concerns please feel free to call: Internal medicine clinic at [phone]   If you have any of these following symptoms, please call us or seek care at an emergency department: -Chest Pain -Difficulty Breathing -Syncope (passing out) -Drooping of face -Slurred speech -Sudden weakness in your leg or arm -Fever -Chills   We are glad that you are feeling better, it was a pleasure to care for you!  Faith Rogue DO

## 2023-06-03 NOTE — Progress Notes (Signed)
RN went over discharge instructions with patient at the bedside. RN made patient aware on new medications and follow up appointments. RN educated patient on heart healthy diet and medication regimen in relation to blood pressure control. Patient verbalize understanding. PIV removed and site is clean dry and intact. Tele monitor removed and CCMD notified. Patient is getting dressed and transportation has been called.

## 2023-06-03 NOTE — Consult Note (Signed)
[facility] Liaison Consult Note    06/03/2023  TARY DIEUDONNE [birth year] [id number]  Insurance: [employer] Dual Complete   Primary Care Provider: Tyson Alias, MD, with [facility] Internal Medicine, this provider is listed for the transition of care follow up appointments  and Transition of Care calls   [facility] Liaison met patient at bedside at [facility].     The patient was screened for hospitalization with noted with COPD exacerbation with rising high medium risk score for unplanned readmission risk 1 ED and 1 hospital admissions in 6 months.  The patient was assessed for potential Community Care Coordination service needs for post hospital transition for care coordination. Review of patient's electronic medical record reveals patient is from home alone. Patient states no needs and reviewed for [facility] but states support from sister and brother.  Plan: [facility] Liaison will continue to follow progress and disposition to asess for post hospital community care coordination/management needs.  Referral request for community care coordination: Anticipate follow up call from [facility] team.   [facility], Population Health does not replace or interfere with any arrangements made by the Inpatient Transition of Care team.   For questions contact:   Charlesetta Shanks, RN, BSN, CCM [place]  [facility], Population Health, [facility] Liaison Direct Dial: [phone] or secure chat Email: [email or web address]

## 2023-06-03 NOTE — TOC Transition Note (Signed)
Transition of Care [facility]) - Discharge Note   Patient Details  Name: [name] MRN: [id number] Date of Birth: [birth year]  Transition of Care [facility]) CM/SW Contact:  Leone Haven, RN Phone Number: 06/03/2023, 10:47 AM   Clinical Narrative:    For dc today, will be on entresto TOC pharmacy to fill, states the copay is zero dollars today.     Barriers to Discharge: Continued Medical Work up   Patient Goals and CMS Choice Patient states their goals for this hospitalization and ongoing recovery are:: return home   Choice offered to / list presented to : NA      Discharge Placement                       Discharge Plan and Services Additional resources added to the After Visit Summary for   In-house Referral: NA Discharge Planning Services: CM Consult Post Acute Care Choice: NA          DME Arranged: N/A DME Agency: NA       HH Arranged: NA          Social Drivers of Health (SDOH) Interventions SDOH Screenings   Food Insecurity: No Food Insecurity (06/01/2023)  Housing: Low Risk  (06/01/2023)  Transportation Needs: No Transportation Needs (06/01/2023)  Utilities: Not At Risk (06/01/2023)  Alcohol Screen: Low Risk  (09/10/2022)  Depression (PHQ2-9): High Risk (09/10/2022)  Financial Resource Strain: Low Risk  (09/10/2022)  Recent Concern: Financial Resource Strain - Medium Risk (08/19/2022)  Physical Activity: Inactive (09/10/2022)  Social Connections: Moderately Isolated (09/10/2022)  Stress: No Stress Concern Present (09/10/2022)  Tobacco Use: High Risk (06/01/2023)     Readmission Risk Interventions     No data to display

## 2023-06-04 ENCOUNTER — Telehealth: Payer: Self-pay

## 2023-06-04 NOTE — Transitions of Care (Post Inpatient/ED Visit) (Signed)
06/04/2023  Name: [name] MRN: [id number] DOB: 1945/08/24  Today's TOC FU Call Status: Today's TOC FU Call Status:: Successful TOC FU Call Completed TOC FU Call Complete Date: 06/04/23 Patient's Name and Date of Birth confirmed.  Transition Care Management Follow-up Telephone Call Date of Discharge: 06/03/23 Discharge Facility: [facility]) Type of Discharge: Inpatient Admission Primary Inpatient Discharge Diagnosis:: Flash Pulmonary Edema How have you been since you were released from the hospital?: Better Any questions or concerns?: No  Items Reviewed: Did you receive and understand the discharge instructions provided?: Yes Medications obtained,verified, and reconciled?: Yes (Medications Reviewed) Any new allergies since your discharge?: No Dietary orders reviewed?: Yes Type of Diet Ordered:: Heart Healthy Do you have support at home?: Yes People in Home: sibling(s) Name of Support/Comfort Primary Source: [name]  Medications Reviewed Today: Medications Reviewed Today     Reviewed by Jodelle Gross, RN (Case Manager) on 06/04/23 at 1004  Med List Status: <None>   Medication Order Taking? Sig Documenting Provider Last Dose Status Informant  albuterol (VENTOLIN HFA) 108 (90 Base) MCG/ACT inhaler [id number] Yes INHALE 1-2 PUFFS BY MOUTH EVERY 6 HOURS AS NEEDED FOR WHEEZE OR SHORTNESS OF BREATH Oswaldo Done Marquita Palms, MD Taking Active Self, Pharmacy Records  amLODipine [facility]) 5 MG tablet [id number] Yes Take 2 tablets (10 mg total) by mouth daily. Marrianne Mood, MD Taking Active   aspirin EC 81 MG tablet [id number] Yes Take 1 tablet (81 mg total) by mouth daily. Swallow whole.  Patient taking differently: Take 81 mg by mouth at bedtime. Swallow whole.   Nada Libman, MD Taking Active Self, Pharmacy Records  clopidogrel (PLAVIX) 75 MG tablet [id number] Yes TAKE 1 TABLET BY MOUTH EVERY DAY Oswaldo Done Marquita Palms, MD Taking Active Self, Pharmacy Records   escitalopram (LEXAPRO) 5 MG tablet [id number] Yes TAKE 1 TABLET (5 MG TOTAL) BY MOUTH DAILY. Tyson Alias, MD Taking Active Self, Pharmacy Records  HYDROcodone-acetaminophen [facility]) 7.5-325 MG tablet [id number] Yes Take 1 tablet by mouth 2 (two) times daily as needed for moderate pain (pain score 4-6). Tyson Alias, MD Taking Active Self, Pharmacy Records  mirtazapine (REMERON) 7.5 MG tablet [id number] Yes Take 2 tablets (15 mg total) by mouth at bedtime. Tyson Alias, MD Taking Active Self, Pharmacy Records  Pancrelipase, Lip-Prot-Amyl, [facility]) 20000-63000 units CPEP [id number] Yes Take 3 capsules by mouth 3 (three) times daily before meals. Tyson Alias, MD Taking Active Self, Pharmacy Records  pantoprazole (PROTONIX) 40 MG tablet [id number] Yes TAKE 1 TABLET EVERY DAY FOR REFLUX Tyson Alias, MD Taking Active Self, Pharmacy Records  rosuvastatin (CRESTOR) 40 MG tablet [id number] Yes Take 1 tablet (40 mg total) by mouth daily. Tyson Alias, MD Taking Active Self, Pharmacy Records  sacubitril-valsartan [facility]) 49-51 MG [id number] Yes Take 1 tablet by mouth 2 (two) times daily. Marrianne Mood, MD Taking Active   senna (SENOKOT) 8.6 MG TABS tablet [id number] Yes Take 2 tablets (17.2 mg total) by mouth in the morning and at bedtime.  Patient taking differently: Take 1 tablet by mouth in the morning and at bedtime.   Tyson Alias, MD Taking Active Self, Pharmacy Records            Home Care and Equipment/Supplies: Were Home Health Services Ordered?: No Any new equipment or medical supplies ordered?: No  Functional Questionnaire: Do you need assistance with bathing/showering or dressing?: No Do you need assistance with meal preparation?: No Do you need assistance with eating?: No Do you  have difficulty maintaining continence: No Do you need assistance with getting out of bed/getting out of a chair/moving?: No Do you have  difficulty managing or taking your medications?: No  Follow up appointments reviewed: PCP Follow-up appointment confirmed?: Yes Date of PCP follow-up appointment?: 06/25/23 [facility] Follow-up appointment confirmed?: Yes Date of Specialist follow-up appointment?: 10/13/23 Follow-Up Specialty Provider:: Dr. Myra Gianotti Do you need transportation to your follow-up appointment?: No (Patient uses [employer] transportation) Do you understand care options if your condition(s) worsen?: Yes-patient verbalized understanding   Jodelle Gross RN, BSN, CCM RN Care Manager  Transitions of Care  [facility] - Population Health  [phone]

## 2023-06-05 ENCOUNTER — Telehealth: Payer: Self-pay | Admitting: Student in an Organized Health Care Education/Training Program

## 2023-06-05 MED ORDER — ZENPEP 20000-63000 UNITS PO CPEP: 3.0000 | ORAL_CAPSULE | Freq: Three times a day (TID) | ORAL | 0 refills | Status: DC

## 2023-06-05 NOTE — Telephone Encounter (Signed)
Pancrelipase, Lip-Prot-Amyl, (ZENPEP) 20000-63000 units CPEP   [id number] - [place], [place] - [address]

## 2023-06-09 ENCOUNTER — Other Ambulatory Visit: Payer: Self-pay

## 2023-06-09 DIAGNOSIS — M25551 Pain in right hip: Secondary | ICD-10-CM

## 2023-06-09 DIAGNOSIS — Z79891 Long term (current) use of opiate analgesic: Secondary | ICD-10-CM

## 2023-06-09 MED ORDER — HYDROCODONE-ACETAMINOPHEN 7.5-325 MG PO TABS: 1.0000 | ORAL_TABLET | Freq: Two times a day (BID) | ORAL | 0 refills | Status: DC | PRN

## 2023-06-17 ENCOUNTER — Encounter: Payer: Self-pay | Admitting: *Deleted

## 2023-06-24 ENCOUNTER — Ambulatory Visit (INDEPENDENT_AMBULATORY_CARE_PROVIDER_SITE_OTHER): Payer: 59 | Admitting: Student

## 2023-06-24 VITALS — BP 147/75 | HR 66 | Temp 97.7°F | Ht 72.0 in | Wt 145.6 lb

## 2023-06-24 DIAGNOSIS — F17210 Nicotine dependence, cigarettes, uncomplicated: Secondary | ICD-10-CM | POA: Diagnosis not present

## 2023-06-24 DIAGNOSIS — I5020 Unspecified systolic (congestive) heart failure: Secondary | ICD-10-CM | POA: Diagnosis not present

## 2023-06-24 DIAGNOSIS — N1831 Chronic kidney disease, stage 3a: Secondary | ICD-10-CM | POA: Diagnosis not present

## 2023-06-24 DIAGNOSIS — F17200 Nicotine dependence, unspecified, uncomplicated: Secondary | ICD-10-CM

## 2023-06-24 MED ORDER — METOPROLOL SUCCINATE ER 25 MG PO TB24: 25.0000 mg | ORAL_TABLET | Freq: Every day | ORAL | 3 refills | Status: DC

## 2023-06-24 MED ORDER — AMLODIPINE BESYLATE 10 MG PO TABS: 10.0000 mg | ORAL_TABLET | Freq: Every day | ORAL | 3 refills | Status: DC

## 2023-06-24 MED ORDER — VARENICLINE TARTRATE 0.5 MG PO TABS: ORAL_TABLET | ORAL | 0 refills | Status: DC

## 2023-06-24 NOTE — Patient Instructions (Addendum)
 Thank you, Mr.Lucile L Mcmackin for allowing us  to provide your care today.   Today we discussed :  For your blood pressure, continue Entresto  49-51 mg twice a day and amlodipine  10 mg.  I have started you on a new medicine metoprolol  succinate 25 mg, to be taken once a day.   2.  For help with quitting smoking, please pick a date when he went to stop smoking. Please start Chantix  0.5 mg/day for 3 days, then 0.5 mg twice a day for 4 days, then 1 mg twice a day.   3.  I will call you with the results of your lab.   I have ordered the following labs for you:  Lab Orders         Basic metabolic panel       Referrals ordered today:   Referral Orders  No referral(s) requested today     I have ordered the following medication/changed the following medications:   Stop the following medications: There are no discontinued medications.   Start the following medications: No orders of the defined types were placed in this encounter.    Follow up:  4 weeks      Should you have any questions or concerns please call the internal medicine clinic at [phone].    Missy Sandhoff, MD  [facility]

## 2023-06-24 NOTE — Progress Notes (Signed)
 CC: HFU  HPI:  [name] is a 78 y.o. male living with a history stated below and presents today for clinic follow-up after hospitalized on 12/15 --12/17 for flash pulmonary edema.  Please see problem based assessment and plan for additional details.  Past Medical History:  Diagnosis Date   AAA (abdominal aortic aneurysm) (HCC)    followed by vasc surgery   Alcohol abuse    Barretts esophagus    bx distal esophagus 2011 neg    Bladder outlet obstruction 04/02/2018   CAD in native artery    a. s/p stent to prox RCA and mid Cx.   Cataract    Cholelithiasis    Chronic osteomyelitis involving ankle and foot, right (HCC) 07/13/2018   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   2006 - 3 adenomas largest 12 mm  2008 - no polyps 2013-  2 mm cecal polyp, adenoma, routine repeat colonoscopy 02/2017 approx 05/14/2017 18 polyps max 12 mm ALL adenomas recall 2019 [name], MD, 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 pt was on Plavix  x 3 years then transitioned to ASA, no problems since   Non-small cell carcinoma of right lung, stage 1 (HCC) 01/30/2016   Renal failure, acute (HCC)    hx of 2/2 medication, resolved   Tobacco abuse    smoker .5 ppd x 55 yrs   UTI (lower urinary tract infection)     Current Outpatient Medications on File Prior to Visit  Medication Sig Dispense Refill   albuterol  (VENTOLIN  HFA) 108 (90 Base) MCG/ACT inhaler INHALE 1-2 PUFFS BY MOUTH EVERY 6 HOURS AS NEEDED FOR WHEEZE  OR SHORTNESS OF BREATH 8.5 each 2   aspirin  EC 81 MG tablet Take 1 tablet (81 mg total) by mouth daily. Swallow whole. (Patient taking differently: Take 81 mg by mouth at bedtime. Swallow whole.) 30 tablet 12   clopidogrel  (PLAVIX ) 75 MG tablet TAKE 1 TABLET BY MOUTH EVERY DAY 90 tablet 1   escitalopram  (LEXAPRO ) 5 MG tablet TAKE 1 TABLET (5 MG TOTAL) BY MOUTH DAILY. 90 tablet 1   HYDROcodone -acetaminophen  (NORCO) 7.5-325 MG tablet Take 1 tablet by mouth 2 (two) times daily as needed for moderate pain (pain score 4-6). 60 tablet 0   mirtazapine  (REMERON ) 7.5 MG tablet Take 2 tablets (15 mg total) by mouth at bedtime. 180 tablet 1   Pancrelipase , Lip-Prot-Amyl, (ZENPEP ) 20000-63000 units CPEP Take 3 capsules by mouth 3 (three) times daily before meals. 270 capsule 0   pantoprazole  (PROTONIX ) 40 MG tablet TAKE 1 TABLET EVERY DAY FOR REFLUX 90 tablet 3   rosuvastatin  (CRESTOR ) 40 MG tablet Take 1 tablet (40 mg total) by mouth daily. 90 tablet 3   sacubitril -valsartan  (ENTRESTO ) 49-51 MG Take 1 tablet by mouth 2 (two) times daily. 60 tablet 2  senna (SENOKOT) 8.6 MG TABS tablet Take 2 tablets (17.2 mg total) by mouth in the morning and at bedtime. (Patient taking differently: Take 1 tablet by mouth in the morning and at bedtime.) 120 tablet 1   No current facility-administered medications on file prior to visit.    Review of Systems: ROS negative except for what is noted on the assessment and plan.  Vitals:   06/24/23 0936 06/24/23 1000  BP: (!) 167/63 (!) 147/75  Pulse: 78 66  Temp: 97.7 F (36.5 C)   TempSrc: Oral   SpO2: 95%   Weight: 145 lb 9.6 oz (66 kg)   Height: 6' (1.829 m)     Physical Exam: Constitutional: Well appearing, not in acute distress. Cardiovascular: regular rate and rhythm, no m/r/g Pulmonary/Chest: normal work of breathing on room air, lungs clear to auscultation bilaterally Abdominal: soft, non-tender, non-distended  Assessment & Plan:   HFrEF (heart failure  with reduced ejection fraction) (HCC) Initial BP 167/63, with repeat improved to 147/75.  He is feeling well postdischarge, not endorsing shortness of breath or chest pain or extremity swelling. Echocardiogram on 12/16 showed left ventricle with mildly reduced EF 45 to 50%. LV has egional wall abnormalities with basal to mid inferior severe hypokinesis. While hospitalized, losartan  was discontinued, and Entresto  49-51 mg was started for GDMT .Patient would benefit from cardiology evaluation for cardiac catheterization to rule out ischemia.  Will add metoprolol  succinate 25 mg to current GDMT regimen.  Plan:  -Continue Entresto  49-51 mg twice daily. -Start metoprolol  succinate 25 mg. -Continue amlodipine  10 mg. -Follow-up with cardiology.  CKD stage 3a, GFR 45-59 ml/min (HCC) SCr 1.48 on day of discharge.  Baseline SCr 1.20. -Check BMP today.  Tobacco use disorder Patient smokes 6 to 7 cigarettes/day, with >40-pack-year smoking history.  He is open to trying Chantix , discussed with patient the side effects including vivid dreams, insomnia headaches.  Patient does not have any history of PTSD or seizures. Advised patient to pick a stop date and start Chantix  a week prior.  -0.5 mg/day for 3 days, then 0.5 mg twice a day for 4 days, then 1 mg twice a day -He will need 12 weeks of treatment with Chantix .  Patient discussed with Dr. Guilloud  Petrita Blunck, MD [facility], PGY-1 Phone: [phone] Date 06/24/2023 Time 11:59 AM

## 2023-06-24 NOTE — Assessment & Plan Note (Signed)
 Patient smokes 6 to 7 cigarettes/day, with >40-pack-year smoking history.  He is open to trying Chantix , discussed with patient the side effects including vivid dreams, insomnia headaches.  Patient does not have any history of PTSD or seizures. Advised patient to pick a stop date and start Chantix  a week prior.  -0.5 mg/day for 3 days, then 0.5 mg twice a day for 4 days, then 1 mg twice a day -He will need 12 weeks of treatment with Chantix .

## 2023-06-24 NOTE — Assessment & Plan Note (Signed)
 SCr 1.48 on day of discharge.  Baseline SCr 1.20. -Check BMP today.

## 2023-06-24 NOTE — Assessment & Plan Note (Signed)
 Initial BP 167/63, with repeat improved to 147/75.  He is feeling well postdischarge, not endorsing shortness of breath or chest pain or extremity swelling. Echocardiogram on 12/16 showed left ventricle with mildly reduced EF 45 to 50%. LV has egional wall abnormalities with basal to mid inferior severe hypokinesis. While hospitalized, losartan  was discontinued, and Entresto  49-51 mg was started for GDMT .Patient would benefit from cardiology evaluation for cardiac catheterization to rule out ischemia.  Will add metoprolol  succinate 25 mg to current GDMT regimen.  Plan:  -Continue Entresto  49-51 mg twice daily. -Start metoprolol  succinate 25 mg. -Continue amlodipine  10 mg. -Follow-up with cardiology.

## 2023-06-25 ENCOUNTER — Telehealth: Payer: Self-pay

## 2023-06-25 ENCOUNTER — Other Ambulatory Visit: Payer: Self-pay | Admitting: Student in an Organized Health Care Education/Training Program

## 2023-06-25 LAB — BASIC METABOLIC PANEL
BUN/Creatinine Ratio: 14 (ref 10–24)
BUN: 22 mg/dL (ref 8–27)
CO2: 23 mmol/L (ref 20–29)
Calcium: 9.3 mg/dL (ref 8.6–10.2)
Chloride: 106 mmol/L (ref 96–106)
Creatinine, Ser: 1.57 mg/dL — ABNORMAL HIGH (ref 0.76–1.27)
Glucose: 95 mg/dL (ref 70–99)
Potassium: 4.3 mmol/L (ref 3.5–5.2)
Sodium: 143 mmol/L (ref 134–144)
eGFR: 45 mL/min/{1.73_m2} — ABNORMAL LOW (ref >59)

## 2023-06-25 NOTE — Telephone Encounter (Signed)
 Pt states the pharmacy still have not received  amLODipine (NORVASC) 10 MG tablet and  sacubitril-valsartan (ENTRESTO) 49-51 MG. Can you re-send this Rx in for him. Pt had a visit with you yesterday.

## 2023-06-25 NOTE — Progress Notes (Signed)
 Transient increase in SCr to 1.57, suspect this is secondary to [facility].  Patient denies NSAID use.   Will monitor and repeat BMP in 3 months.

## 2023-06-25 NOTE — Telephone Encounter (Signed)
 Medication sent to pharmacy

## 2023-07-01 NOTE — Progress Notes (Signed)
 Internal Medicine Clinic Attending  Case discussed with the resident at the time of the visit.  We reviewed the resident's history and exam and pertinent patient test results.  I agree with the assessment, diagnosis, and plan of care documented in the resident's note.

## 2023-07-03 ENCOUNTER — Other Ambulatory Visit: Payer: Self-pay

## 2023-07-03 MED ORDER — SACUBITRIL-VALSARTAN 49-51 MG PO TABS: 1.0000 | ORAL_TABLET | Freq: Two times a day (BID) | ORAL | 2 refills | Status: DC

## 2023-07-03 MED ORDER — AMLODIPINE BESYLATE 10 MG PO TABS: 10.0000 mg | ORAL_TABLET | Freq: Every day | ORAL | 3 refills | Status: DC

## 2023-07-10 ENCOUNTER — Other Ambulatory Visit: Payer: Self-pay

## 2023-07-10 DIAGNOSIS — M25551 Pain in right hip: Secondary | ICD-10-CM

## 2023-07-10 DIAGNOSIS — Z79891 Long term (current) use of opiate analgesic: Secondary | ICD-10-CM

## 2023-07-10 MED ORDER — HYDROCODONE-ACETAMINOPHEN 7.5-325 MG PO TABS: 1.0000 | ORAL_TABLET | Freq: Two times a day (BID) | ORAL | 0 refills | Status: DC | PRN

## 2023-07-11 ENCOUNTER — Other Ambulatory Visit: Payer: Self-pay | Admitting: Student

## 2023-07-11 ENCOUNTER — Other Ambulatory Visit: Payer: Self-pay | Admitting: Student in an Organized Health Care Education/Training Program

## 2023-07-17 ENCOUNTER — Other Ambulatory Visit: Payer: Self-pay | Admitting: Student

## 2023-07-27 IMAGING — CT CT CTA ABD/PEL W/CM AND/OR W/O CM
3 of 13 series · 8 of 46 positions shown, 13 images · IV contrast (APPLIED)
Comparison: 11/07/2020

CLINICAL DATA: 75-year-old with abdominal pain, aortic dissection
suspected. GI bleed and hematuria. Rule out abdominal aortic
aneurysm.

EXAM:
CTA ABDOMEN AND PELVIS WITHOUT AND WITH CONTRAST
TECHNIQUE: Multidetector CT imaging of the abdomen and pelvis was performed
using the standard protocol during bolus administration of
intravenous contrast. Multiplanar reconstructed images and MIPs were
obtained and reviewed to evaluate the vascular anatomy.
CONTRAST:  100mL OMNIPAQUE IOHEXOL 350 MG/ML SOLN

[Series 5: axial arterial · axial · arterial · 0.77mm/px · z∈[-674,-468]mm · 4 of 269 slices shown]
[im 21/269  soft-tissue]
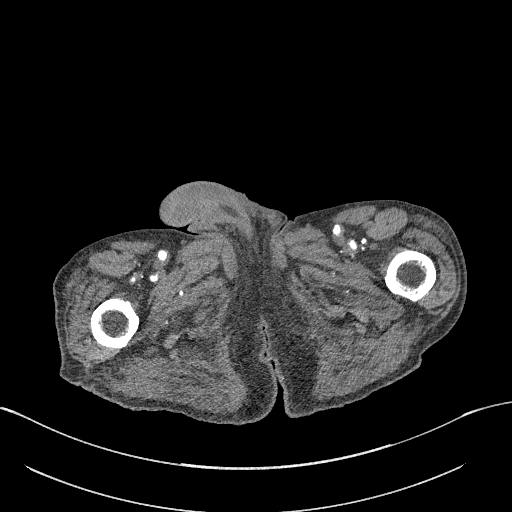
[im 62/269  soft-tissue]
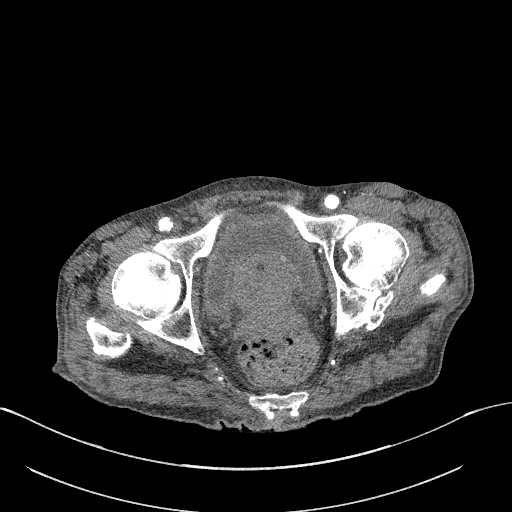
[im 83/269  soft-tissue]
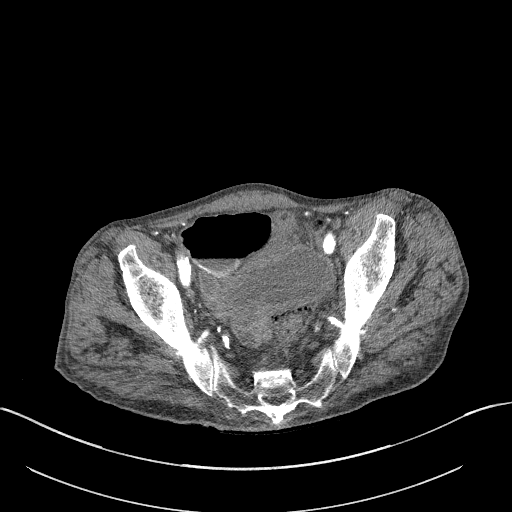
[im 124/269  soft-tissue]
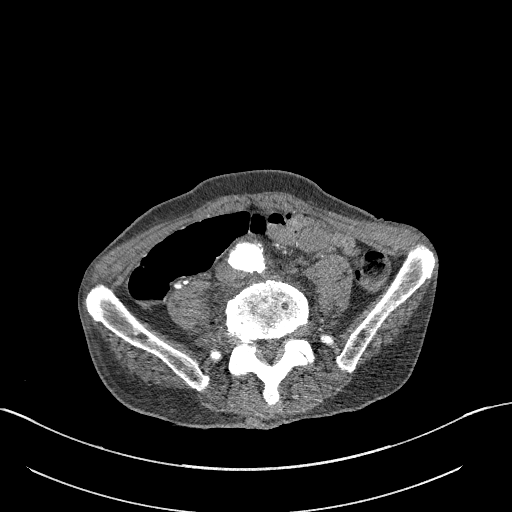

[Series 8: coronals · coronal · 0.62mm/px · 1 of 132 slices shown, 2 images]
[im 66/132  soft-tissue]
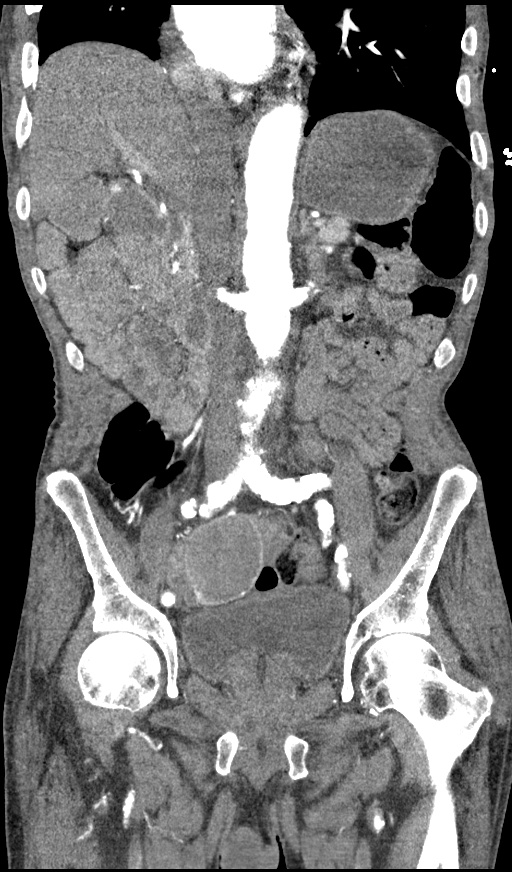
[im 66/132  bone]
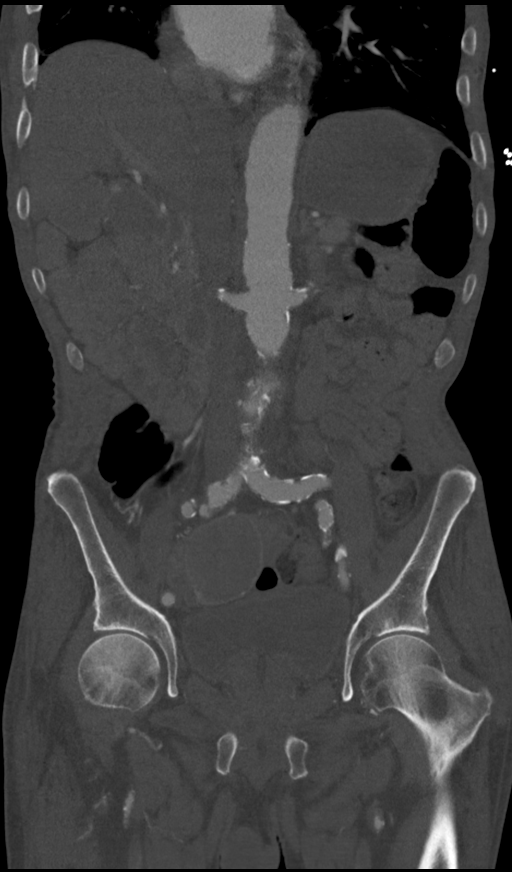

[Series 12: axial portal (person_name) · axial · portal-venous · 0.77mm/px · z∈[-568,-308]mm · 3 of 105 slices shown, 7 images]
[im 27/105  soft-tissue]
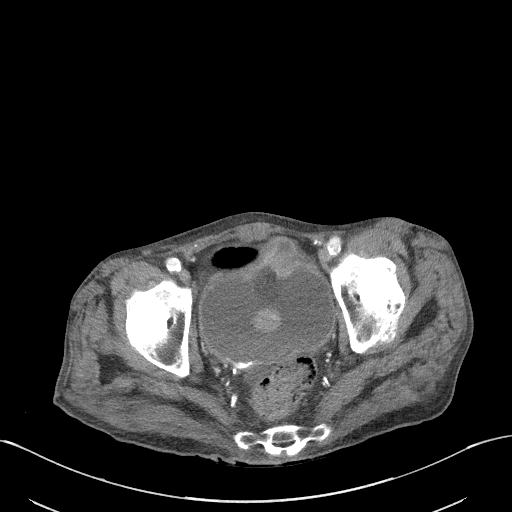
[im 27/105  lung]
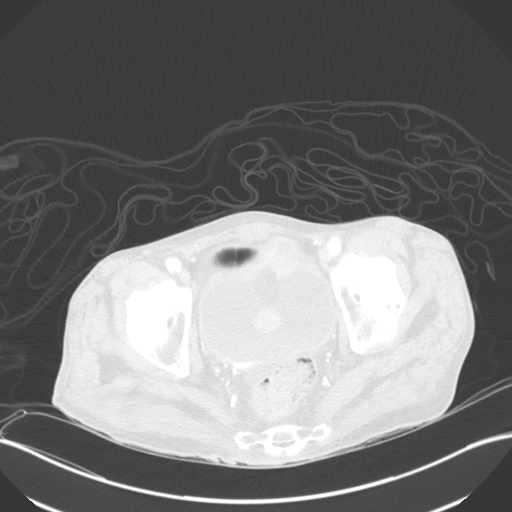
[im 27/105  bone]
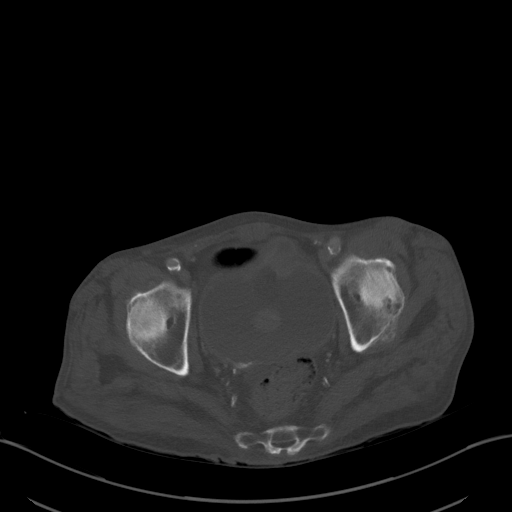
[im 53/105  soft-tissue]
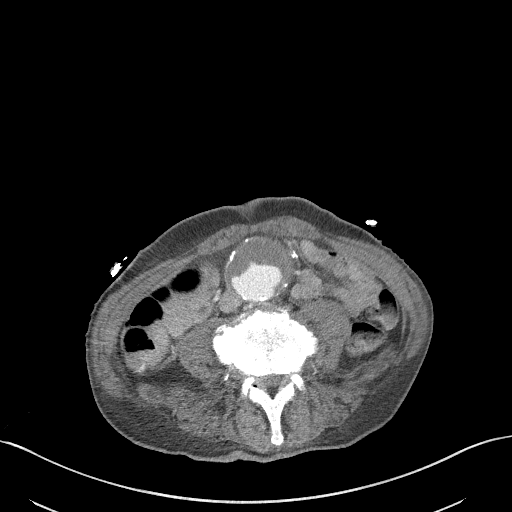
[im 53/105  lung]
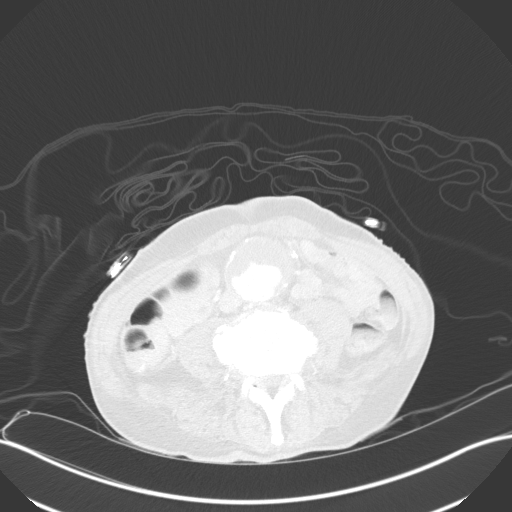
[im 79/105  soft-tissue]
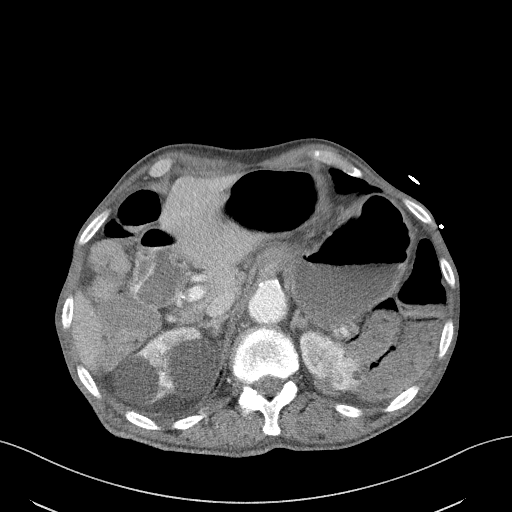
[im 79/105  lung]
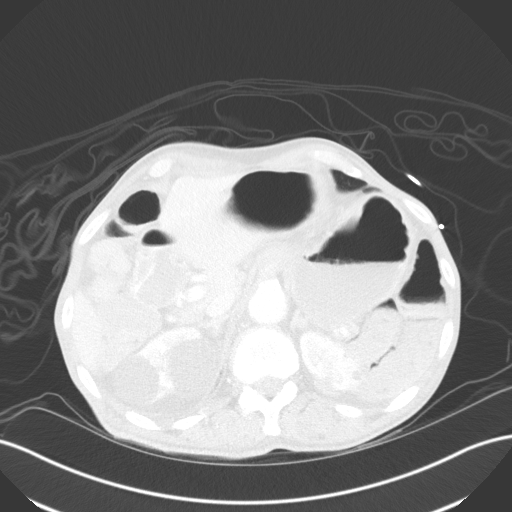

[8 of 46 positions shown; findings below may reference images not displayed]

FINDINGS: VASCULAR

Aorta: Infrarenal abdominal aortic aneurysm measures up to 5.6 cm
and previously measured 5.3 cm on 11/07/2020. Again noted is a large
amount of mural thrombus within the abdominal aortic aneurysm. Mural
thrombus within the aneurysm may have slightly decreased in the
interim. Diffuse atherosclerotic disease and irregularity in the
distal descending thoracic aorta. Distal descending thoracic aorta
measures 3.7 cm and stable. No evidence for an aortic dissection.
Small focal outpouchings in the distal descending thoracic aorta
could be related to small penetrating ulcers. Most prominent
outpouching is near the aortic hiatus and this measures
approximately 0.7 cm in the AP dimension and similar to the previous
examination. Aorta at the level of celiac artery measures 3.1 cm.
Juxtarenal abdominal aorta measures approximately 3.0 cm in the
transverse dimension.

Celiac: Patent without evidence of aneurysm, dissection, vasculitis
or significant stenosis.

SMA: Acute angulation of the proximal SMA. There is mild narrowing
at this area of angulation.

Renals: Both renal arteries are patent without evidence of aneurysm,
dissection, vasculitis, fibromuscular dysplasia or significant
stenosis. Small accessory left renal artery.

IMA: Origin appears to be occluded due to the aortic aneurysm and
mural thrombus.

Inflow: Aneurysm of the right common iliac artery measures 2.0 cm
and stable. Right internal iliac artery is patent but there is
significant disease near the origin with poorly defined saccular
aneurysm near the origin and proximal aspect of the right internal
iliac artery measuring 8-9 mm. Right external iliac artery is
tortuous but patent without significant stenosis. Left common iliac
artery measures up to 1.3 cm. Left external iliac artery is tortuous
but patent without significant stenosis. Left internal iliac artery
is patent.

Proximal Outflow: Proximal femoral arteries are patent bilaterally.

Veins: Hepatic veins and main portal venous system are patent.
Portal vein thrombus has resolved.

Review of the MIP images confirms the above findings.

NON-VASCULAR

Lower chest: Lung bases are clear.

Hepatobiliary: Calcified gallstones. Gallbladder appears to be
decompressed. No focal liver lesion. No significant biliary
dilatation.

Pancreas: Again noted are calcifications at the pancreatic head.
Poorly defined hypodensities at the pancreatic head are similar to
the previous examination. No significant pancreatic duct dilatation
or inflammation.

Spleen: Normal in size without focal abnormality.

Adrenals/Urinary Tract: Normal adrenal glands. Bilateral renal
cysts. There is slightly hyperdense structure along the posterior
left kidney on sequence 12, image 27. No enhancement within this
hyperdense structure and probably represents a proteinaceous or
hemorrhagic cyst. Vascular calcifications in the kidneys. There may
be a tiny nonobstructive left kidney stone. Foley catheter is
present. There is high-density material within the bladder which
could represent wall calcifications or urinary calculi. Bladder is
mildly distended despite having a Foley catheter. Tip of the Foley
catheter is protruding along the anterior aspect of the bladder and
probably within a diverticulum.

Stomach/Bowel: Evidence for contrast extravasation in the proximal
duodenum on sequence 5 image 58 and sequence 12, image 27. This is
compatible with active GI bleeding in the proximal duodenum. Tubular
type structure in the duodenum on sequence 5 image 31 is
nonspecific. High-density material in the right colon and cecum is
seen on the pre contrast images. There is no evidence of acute
bleeding in the colon. No evidence for a bowel obstruction.

Lymphatic: No abdominopelvic lymph node enlargement.

Reproductive: Prostate is enlarged and there is a Foley catheter
extending into the urinary bladder.

Other: Negative for ascites.  Negative for free air.

Musculoskeletal: Multilevel degenerative changes in the lumbar
spine.
IMPRESSION: VASCULAR

1. Active GI bleeding with contrast extravasation in the proximal
duodenum. Etiology for the GI bleeding is uncertain.
2. Abdominal aortic aneurysm measuring up to 5.6 cm. Aneurysm has
enlarged since October 2020 when it measured 5.3 cm. Recommend referral
to a vascular specialist. This recommendation follows ACR consensus
guidelines: White Paper of the ACR Incidental Findings Committee II
on Vascular Findings. [HOSPITAL] 2420; [DATE].
3. Portal venous thrombus has resolved.
4. Extensive atherosclerotic disease involving the aorta with stable
penetrating ulcer at the level of the hiatus.
5. Stable aneurysmal dilatation of the right common iliac artery
measuring 2.0 cm.
6. Atherosclerotic disease at the origin and proximal aspect of the
right internal iliac artery with a complex small saccular aneurysm
in this area.

NON-VASCULAR

1. Foley catheter is present but the tip is protruding anterior to
the bladder. There is wall thickening in this area and suspect this
is chronic and may be associated with a bladder diverticulum.
Reportedly, the patient has hematuria and the position of the Foley
catheter may be contributing to the hematuria.
2. Possible bladder calculi.
3. Again noted is heterogeneity at the pancreatic head with some
calcifications and low-density areas. Questionable diverticulum or
tubular structure in the descending duodenum poorly characterized.
Patient had a MRI of the abdomen to evaluate the pancreas in the
past and recommend referral to that report for follow-up
recommendations.
4. Cholelithiasis.  The gallbladder appears to be contracted.
5. Bilateral renal cysts. Hyperdense cyst in left kidney upper pole
is most compatible with a proteinaceous or hemorrhagic cyst.

These results were called by telephone at the time of interpretation
on 03/06/2021 at [DATE] to provider GUANGZHOU BERGARA , who verbally
acknowledged these results.

## 2023-07-29 ENCOUNTER — Other Ambulatory Visit: Payer: Self-pay | Admitting: Student in an Organized Health Care Education/Training Program

## 2023-07-29 DIAGNOSIS — F32A Depression, unspecified: Secondary | ICD-10-CM

## 2023-07-31 DIAGNOSIS — R3914 Feeling of incomplete bladder emptying: Secondary | ICD-10-CM | POA: Diagnosis not present

## 2023-08-04 ENCOUNTER — Ambulatory Visit: Payer: 59 | Admitting: Student in an Organized Health Care Education/Training Program

## 2023-08-04 ENCOUNTER — Encounter: Payer: Self-pay | Admitting: Student in an Organized Health Care Education/Training Program

## 2023-08-04 VITALS — BP 147/70 | HR 60 | Temp 97.7°F | Ht 72.0 in | Wt 146.8 lb

## 2023-08-04 DIAGNOSIS — I5022 Chronic systolic (congestive) heart failure: Secondary | ICD-10-CM | POA: Diagnosis not present

## 2023-08-04 DIAGNOSIS — M25551 Pain in right hip: Secondary | ICD-10-CM | POA: Diagnosis not present

## 2023-08-04 DIAGNOSIS — J439 Emphysema, unspecified: Secondary | ICD-10-CM

## 2023-08-04 DIAGNOSIS — F17200 Nicotine dependence, unspecified, uncomplicated: Secondary | ICD-10-CM

## 2023-08-04 DIAGNOSIS — I110 Hypertensive heart disease with heart failure: Secondary | ICD-10-CM

## 2023-08-04 DIAGNOSIS — F17210 Nicotine dependence, cigarettes, uncomplicated: Secondary | ICD-10-CM | POA: Diagnosis not present

## 2023-08-04 DIAGNOSIS — M25552 Pain in left hip: Secondary | ICD-10-CM | POA: Diagnosis not present

## 2023-08-04 DIAGNOSIS — I10 Essential (primary) hypertension: Secondary | ICD-10-CM

## 2023-08-04 DIAGNOSIS — Z79891 Long term (current) use of opiate analgesic: Secondary | ICD-10-CM

## 2023-08-04 DIAGNOSIS — E43 Unspecified severe protein-calorie malnutrition: Secondary | ICD-10-CM | POA: Diagnosis not present

## 2023-08-04 MED ORDER — SPIRIVA RESPIMAT 2.5 MCG/ACT IN AERS: 2.0000 | INHALATION_SPRAY | Freq: Every day | RESPIRATORY_TRACT | 2 refills | Status: DC

## 2023-08-04 MED ORDER — HYDROCODONE-ACETAMINOPHEN 7.5-325 MG PO TABS: 1.0000 | ORAL_TABLET | Freq: Two times a day (BID) | ORAL | 0 refills | Status: DC | PRN

## 2023-08-04 MED ORDER — SACUBITRIL-VALSARTAN 97-103 MG PO TABS: 1.0000 | ORAL_TABLET | Freq: Two times a day (BID) | ORAL | 3 refills | Status: DC

## 2023-08-04 MED ORDER — SACUBITRIL-VALSARTAN 49-51 MG PO TABS: 1.0000 | ORAL_TABLET | Freq: Two times a day (BID) | ORAL | 5 refills | Status: DC

## 2023-08-04 NOTE — Assessment & Plan Note (Signed)
Weight is 146 pounds with a BMI of 20.  He has gained about 8 pounds since I last saw him in November.  Doing great.  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  Will continue with pancrelipase 3 capsules 3 times daily.

## 2023-08-04 NOTE — Assessment & Plan Note (Signed)
Chronic and stable.  Chronic pain generator is low back and hip pain.  Has been using hydrocodone 7.5 mg twice daily for many years and doing well.  Reports this is still beneficial.  Helpful in improving functional status.  I reviewed the PDMP which was appropriate.  Will check a tox assure today.  I sent 12-month supply of hydrocodone to his pharmacy.

## 2023-08-04 NOTE — Progress Notes (Signed)
Established Patient Office Visit  Subjective   Patient ID: [name], male    DOB: May 27, 1946  Age: 78 y.o. MRN: [id number]  Chief Complaint  Patient presents with   Follow-up   Medication Refill    HPI  78 year old person here for management of hypertension.  Doing very well since I last saw him in November.  He had 1 hospitalization for dyspnea, diagnosed with a exacerbation of emphysema and also pulmonary edema.  Found to have mildly reduced ejection fraction.  Doing well since starting Entresto.  Reports good adherence with medications.  Good functional status.  Walks every day at least 2 or 3 blocks.  Not limited by dyspnea.  No chest pain.  No abdominal pain.  Eating and drinking well.  Lives independently, functional in all activities of daily living.  No recent falls.  No lightheadedness or palpitations.  He has no concerns and feels like he is doing really well especially how far he has come over the last few years.    Objective:     BP (!) 147/70 (BP Location: Left Arm, Patient Position: Sitting, Cuff Size: Small)   Pulse 60   Temp 97.7 F (36.5 C) (Oral)   Ht 6' (1.829 m)   Wt 146 lb 12.8 oz (66.6 kg)   SpO2 95%   BMI 19.91 kg/m    Physical Exam  General: Well-appearing man Neck: No lymphadenopathy, normal thyroid, normal JVD Heart: Regular, no murmurs Lungs: Clear throughout, no crackles or wheezing Extremities: No lower extremity edema Neuro: Walks with a cane, alert, conversational, normal get up and go, mildly slowed wide-based gait but doing well, full strength upper and lower extremities    Assessment & Plan:   Problem List Items Addressed This Visit       High   Tobacco use disorder - Primary (Chronic)   Unable to tolerate Chantix.  Currently smoking about 6 cigarettes daily.  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   Chronic pain generator is low back and hip pain.  Has been using hydrocodone 7.5 mg twice daily for many years and doing well.  Reports this is still beneficial.  Helpful in improving functional status.  I reviewed the PDMP which was appropriate.  Will check a tox assure today.  I sent 43-month supply of hydrocodone to his pharmacy.      Relevant Medications   HYDROcodone-acetaminophen (NORCO) 7.5-325 MG tablet   Other Relevant Orders   ToxAssure Select,+Antidepr,UR   Protein-calorie malnutrition, severe (HCC) (Chronic)   Weight is 146 pounds with a BMI of 20.  He has gained about 8 pounds since I last saw him in November.  Doing great.  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  Will continue with pancrelipase 3 capsules 3 times daily.      Chronic heart failure with mildly reduced ejection fraction (HFmrEF, 41-49%) (HCC) (Chronic)   Recent echocardiogram with mildly reduced ejection fraction.  Evidence of pulmonary hypertension.  Well compensated today, appears euvolemic.  NYHA class I symptoms.  Hypertension is above goal.  Etiology of the heart failure likely ischemic disease in this person with known ischemic vascular disease, tobacco use, long history of hypertension.  Will increase Entresto to 97-1 03.  Follow-up in 2-3 months for monitoring renal function.      Relevant Medications   sacubitril-valsartan (ENTRESTO)  97-103 MG     Medium    Essential hypertension (Chronic)   Relevant Medications   sacubitril-valsartan (ENTRESTO) 97-103 MG     Low   Emphysema lung (HCC) (Chronic)   Chronic little worsening today.  Emphysema structural changes seen on imaging.  Likely due to tobacco use.  Also complicated by pulmonary hypertension due to mild HFrEF.  Currently using short acting albuterol too often, 4 times daily.  Very functional.  I am going to start Spiriva for combination LAMA LABA, 2 puffs daily.  Follow-up in 3 months.      Relevant Medications    Tiotropium Bromide Monohydrate (SPIRIVA RESPIMAT) 2.5 MCG/ACT AERS   Other Visit Diagnoses       Bilateral hip pain       Relevant Medications   HYDROcodone-acetaminophen (NORCO) 7.5-325 MG tablet       Return in about 2 months (around 10/02/2023).    Tyson Alias, MD

## 2023-08-04 NOTE — Assessment & Plan Note (Signed)
Recent echocardiogram with mildly reduced ejection fraction.  Evidence of pulmonary hypertension.  Well compensated today, appears euvolemic.  NYHA class I symptoms.  Hypertension is above goal.  Etiology of the heart failure likely ischemic disease in this person with known ischemic vascular disease, tobacco use, long history of hypertension.  Will increase Entresto to 97-1 03.  Follow-up in 2-3 months for monitoring renal function.

## 2023-08-04 NOTE — Assessment & Plan Note (Signed)
Chronic little worsening today.  Emphysema structural changes seen on imaging.  Likely due to tobacco use.  Also complicated by pulmonary hypertension due to mild HFrEF.  Currently using short acting albuterol too often, 4 times daily.  Very functional.  I am going to start Spiriva for combination LAMA LABA, 2 puffs daily.  Follow-up in 3 months.

## 2023-08-04 NOTE — Assessment & Plan Note (Addendum)
Unable to tolerate Chantix.  Currently smoking about 6 cigarettes daily.  We talked about nicotine replacement therapy including gum, he will consider

## 2023-08-06 LAB — TOXASSURE SELECT,+ANTIDEPR,UR

## 2023-08-11 ENCOUNTER — Other Ambulatory Visit: Payer: Self-pay | Admitting: Student in an Organized Health Care Education/Training Program

## 2023-09-01 ENCOUNTER — Other Ambulatory Visit: Payer: Self-pay

## 2023-09-01 DIAGNOSIS — I7142 Juxtarenal abdominal aortic aneurysm, without rupture: Secondary | ICD-10-CM

## 2023-09-08 ENCOUNTER — Other Ambulatory Visit: Payer: Self-pay | Admitting: Internal Medicine

## 2023-09-08 DIAGNOSIS — M25551 Pain in right hip: Secondary | ICD-10-CM

## 2023-09-08 DIAGNOSIS — Z79891 Long term (current) use of opiate analgesic: Secondary | ICD-10-CM

## 2023-09-08 NOTE — Telephone Encounter (Unsigned)
 Copied from CRM [id number]. Topic: Clinical - Medication Refill >> Sep 08, 2023  2:48 PM Maryann Alar wrote: Most Recent Primary Care Visit:  Provider: Tyson Alias  Department: [facility]  Visit Type: OPEN ESTABLISHED  Date: 08/04/2023  Medication: HYDROcodone-acetaminophen (NORCO) 7.5-325 MG tablet  Has the patient contacted their pharmacy? Yes (Agent: If no, request that the patient contact the pharmacy for the refill. If patient does not wish to contact the pharmacy document the reason why and proceed with request.) (Agent: If yes, when and what did the pharmacy advise?)  Is this the correct pharmacy for this prescription? Yes If no, delete pharmacy and type the correct one.  This is the patient's preferred pharmacy:  [id number] - [place], [place] - [address] AT [facility] [address] [place] [address] Phone: [phone] Fax: [phone]  [facility] - [address] [place] - [address] [place] [address] Phone: [phone] Fax: [phone]  [facility] [address] [place] [address] Phone: [phone] Fax: [phone]   Has the prescription been filled recently? No  Is the patient out of the medication? Yes  Has the patient been seen for an appointment in the last year OR does the patient have an upcoming appointment? Yes  Can we respond through MyChart? No  Agent: Please be advised that Rx refills may take up to 3 business days. We ask that you follow-up with your pharmacy.

## 2023-09-08 NOTE — Telephone Encounter (Signed)
 Last rx written - 08/04/23. Last OV - saw Dr Oswaldo Done 08/04/23. TOX - 08/04/23.

## 2023-09-09 MED ORDER — HYDROCODONE-ACETAMINOPHEN 7.5-325 MG PO TABS: 1.0000 | ORAL_TABLET | Freq: Two times a day (BID) | ORAL | 0 refills | Status: DC | PRN

## 2023-09-17 ENCOUNTER — Other Ambulatory Visit: Payer: Self-pay | Admitting: Internal Medicine

## 2023-09-17 NOTE — Telephone Encounter (Addendum)
 Medication discontinued 08/19/22

## 2023-09-18 ENCOUNTER — Other Ambulatory Visit: Payer: Self-pay | Admitting: Internal Medicine

## 2023-09-18 MED ORDER — METOPROLOL SUCCINATE ER 25 MG PO TB24: 25.0000 mg | ORAL_TABLET | Freq: Every day | ORAL | 0 refills | Status: DC

## 2023-09-18 NOTE — Telephone Encounter (Signed)
 Copied from CRM [id number]. Topic: Clinical - Medication Refill >> Sep 18, 2023 11:31 AM Claudine Mouton wrote: Most Recent Primary Care Visit:  Provider: Tyson Alias  Department: [facility]  Visit Type: OPEN ESTABLISHED  Date: 08/04/2023  Medication: metoprolol succinate (TOPROL-XL) 25 MG 24 hr tablet [[id number]]  Has the patient contacted their pharmacy? Yes (Agent: If no, request that the patient contact the pharmacy for the refill. If patient does not wish to contact the pharmacy document the reason why and proceed with request.) (Agent: If yes, when and what did the pharmacy advise?)  Is this the correct pharmacy for this prescription? Yes If no, delete pharmacy and type the correct one.  This is the patient's preferred pharmacy:   [id number] - [place], [place] - [address] AT [facility] [address] [place] [address] Phone: [phone] Fax: [phone]  Has the prescription been filled recently? No.  Is the patient out of the medication? Yes  Has the patient been seen for an appointment in the last year OR does the patient have an upcoming appointment? Yes  Can we respond through MyChart? No  Agent: Please be advised that Rx refills may take up to 3 business days. We ask that you follow-up with your pharmacy.

## 2023-10-03 ENCOUNTER — Ambulatory Visit
Admission: RE | Admit: 2023-10-03 | Discharge: 2023-10-03 | Disposition: A | Discharge status: Home / Self Care | Source: Ambulatory Visit | Attending: Surgery | Admitting: Surgery

## 2023-10-03 DIAGNOSIS — I7142 Juxtarenal abdominal aortic aneurysm, without rupture: Secondary | ICD-10-CM

## 2023-10-03 MED ORDER — IOPAMIDOL (ISOVUE-370) INJECTION 76%
75.0000 mL | Freq: Once | INTRAVENOUS | Status: AC | PRN
  Administered 2023-10-03: 75 mL via INTRAVENOUS

## 2023-10-07 ENCOUNTER — Other Ambulatory Visit: Payer: Self-pay | Admitting: Internal Medicine

## 2023-10-07 DIAGNOSIS — M25551 Pain in right hip: Secondary | ICD-10-CM

## 2023-10-07 DIAGNOSIS — Z79891 Long term (current) use of opiate analgesic: Secondary | ICD-10-CM

## 2023-10-07 MED ORDER — HYDROCODONE-ACETAMINOPHEN 7.5-325 MG PO TABS: 1.0000 | ORAL_TABLET | Freq: Two times a day (BID) | ORAL | 0 refills | Status: DC | PRN

## 2023-10-07 NOTE — Telephone Encounter (Signed)
 Copied from CRM [id number]. Topic: Clinical - Medication Refill >> Oct 07, 2023 10:13 AM Hamdi H wrote: Most Recent Primary Care Visit:  Provider: Ether Hercules  Department[facility]  Visit Type: OPEN ESTABLISHED  Date: 08/04/2023  Medication: HYDROcodone -acetaminophen  (NORCO) 7.5-325 MG tablet  Has the patient contacted their pharmacy? Yes (Agent: If no, request that the patient contact the pharmacy for the refill. If patient does not wish to contact the pharmacy document the reason why and proceed with request.) (Agent: If yes, when and what did the pharmacy advise?) Needs the doctor to refill.  Is this the correct pharmacy for this prescription? Yes If no, delete pharmacy and type the correct one.  This is the patient's preferred pharmacy:  [id number] - [place], [place] - [address] AT [facility] [address] [place] [address] Phone: [phone] Fax: [phone]  Has the prescription been filled recently? No  Is the patient out of the medication? No  Has the patient been seen for an appointment in the last year OR does the patient have an upcoming appointment? Yes  Can we respond through MyChart? No  Agent: Please be advised that Rx refills may take up to 3 business days. We ask that you follow-up with your pharmacy.

## 2023-10-13 ENCOUNTER — Other Ambulatory Visit: Payer: Self-pay

## 2023-10-13 ENCOUNTER — Other Ambulatory Visit: Payer: Self-pay | Admitting: Internal Medicine

## 2023-10-13 ENCOUNTER — Encounter: Payer: Self-pay | Admitting: Surgery

## 2023-10-13 ENCOUNTER — Ambulatory Visit: Payer: 59 | Attending: Surgery | Admitting: Surgery

## 2023-10-13 VITALS — BP 132/65 | HR 57 | Temp 98.0°F | Ht 72.0 in | Wt 140.0 lb

## 2023-10-13 DIAGNOSIS — I7142 Juxtarenal abdominal aortic aneurysm, without rupture: Secondary | ICD-10-CM

## 2023-10-13 NOTE — Progress Notes (Signed)
 [facility]  Patient name: [name] MRN: [id number] DOB: [birth year] Sex: male   REASON FOR VISIT:    Follow-up  HISOTRY OF PRESENT ILLNESS:   [name] is a 78 y.o. male who underwent a fenestrated juxtarenal abdominal aortic aneurysm repair with stenting of his left and right renal arteries on 08/16/2022.  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  His postoperative CT scan shows that his graft is in good position with no evidence of endoleak.  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  He has a suprapubic catheter in place.  He suffers from protein calorie malnutrition as well as weight loss.  He has a history of coronary artery disease, status post PCI.   PAST MEDICAL HISTORY:   Past Medical History:  Diagnosis Date   AAA (abdominal aortic aneurysm) (HCC)    followed by vasc surgery   Alcohol abuse    Barretts esophagus    bx distal esophagus 2011 neg    Bladder outlet obstruction 04/02/2018   CAD in native artery    a. s/p stent to prox RCA and mid Cx.   Cataract    Cholelithiasis    Chronic osteomyelitis involving ankle and foot, right (HCC) 07/13/2018   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   2006 - 3 adenomas largest 12 mm  2008 - no polyps 2013-  2 mm cecal polyp, adenoma, routine repeat colonoscopy 02/2017 approx 05/14/2017 18 polyps max 12 mm ALL adenomas recall 2019 Kenney Peacemaker, MD, [facility]      Dieulafoy lesion of duodenum    Diverticulosis of colon    DJD (degenerative joint disease)    left hip   Full dentures    upper and lower   GERD (gastroesophageal reflux disease)    diet controlled    Gout    feet   Hiatal hernia    2 cm noted on upper endos 2011    Hx of adenomatous colonic polyps    Hyperlipidemia    Hypertension    Internal  hemorrhoid    Lung cancer (HCC)    squamous cell (T2N0MX), 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 pt was on Plavix  x 3 years then transitioned to ASA, no problems since   Non-small cell carcinoma of right lung, stage 1 (HCC) 01/30/2016   Renal failure, acute (HCC)    hx of 2/2 medication, resolved   Tobacco abuse    smoker .5 ppd x 55 yrs   UTI (lower urinary tract infection)      FAMILY HISTORY:   Family History  Problem Relation Age of Onset   Diabetes Mother    Hypertension Sister    Heart disease Sister    Diabetes Sister    Arthritis Brother    Gout Brother    Colon cancer Neg Hx    Esophageal cancer Neg Hx    Rectal cancer Neg Hx    Stomach cancer Neg Hx    Colon polyps Neg Hx    Inflammatory bowel disease Neg Hx    Liver disease Neg Hx    Pancreatic cancer Neg Hx     SOCIAL HISTORY:   Social History   Tobacco Use   Smoking status: Every  Day    Current packs/day: 0.75    Average packs/day: 0.8 packs/day for 55.0 years (41.3 ttl pk-yrs)    Types: Cigarettes   Smokeless tobacco: Never   Tobacco comments:    Smokes 8-9 cigarettes per day  Substance Use Topics   Alcohol use: Not Currently     ALLERGIES:   Allergies  Allergen Reactions   Candesartan Cilexetil-Hctz Other (See Comments)    Acute renal failure   Hydrochlorothiazide Other (See Comments)    Acute renal failure   Shellfish Allergy Other (See Comments)    Gout occurs   Betadine [Povidone Iodine ] Itching     CURRENT MEDICATIONS:   Current Outpatient Medications  Medication Sig Dispense Refill   albuterol  (VENTOLIN  HFA) 108 (90 Base) MCG/ACT inhaler INHALE 1-2 PUFFS BY MOUTH EVERY 6 HOURS AS NEEDED FOR WHEEZE OR SHORTNESS OF BREATH 8.5 each 2   amLODipine  (NORVASC ) 10 MG tablet Take 1 tablet (10 mg total) by mouth daily. 90 tablet 3   aspirin  EC 81 MG tablet Take 1 tablet (81 mg total) by mouth daily. Swallow whole. (Patient  taking differently: Take 81 mg by mouth at bedtime. Swallow whole.) 30 tablet 12   clopidogrel  (PLAVIX ) 75 MG tablet TAKE 1 TABLET BY MOUTH EVERY DAY 90 tablet 1   escitalopram  (LEXAPRO ) 5 MG tablet TAKE 1 TABLET (5 MG TOTAL) BY MOUTH DAILY. 90 tablet 1   HYDROcodone -acetaminophen  (NORCO) 7.5-325 MG tablet Take 1 tablet by mouth 2 (two) times daily as needed for moderate pain (pain score 4-6). 60 tablet 0   metoprolol  succinate (TOPROL -XL) 25 MG 24 hr tablet Take 1 tablet (25 mg total) by mouth daily. 90 tablet 0   mirtazapine  (REMERON ) 7.5 MG tablet TAKE 2 TABLETS (15 MG TOTAL) BY MOUTH AT BEDTIME. 180 tablet 3   Pancrelipase , Lip-Prot-Amyl, (ZENPEP ) 20000-63000 units CPEP Take 3 capsules by mouth 3 (three) times daily before meals. 270 capsule 0   pantoprazole  (PROTONIX ) 40 MG tablet TAKE 1 TABLET EVERY DAY FOR REFLUX 90 tablet 3   rosuvastatin  (CRESTOR ) 40 MG tablet Take 1 tablet (40 mg total) by mouth daily. 90 tablet 3   sacubitril -valsartan  (ENTRESTO ) 97-103 MG Take 1 tablet by mouth 2 (two) times daily. 60 tablet 3   senna (SENOKOT) 8.6 MG TABS tablet Take 2 tablets (17.2 mg total) by mouth in the morning and at bedtime. (Patient taking differently: Take 1 tablet by mouth in the morning and at bedtime.) 120 tablet 1   Tiotropium Bromide Monohydrate  (SPIRIVA  RESPIMAT) 2.5 MCG/ACT AERS Inhale 2 puffs into the lungs daily. 4 g 2   No current facility-administered medications for this visit.    REVIEW OF SYSTEMS:   [X]  denotes positive finding, [ ]  denotes negative finding Cardiac  Comments:  Chest pain or chest pressure:    Shortness of breath upon exertion:    Short of breath when lying flat:    Irregular heart rhythm:        Vascular    Pain in calf, thigh, or hip brought on by ambulation:    Pain in feet at night that wakes you up from your sleep:     Blood clot in your veins:    Leg swelling:         Pulmonary    Oxygen at home:    Productive cough:     Wheezing:          Neurologic    Sudden weakness in arms or legs:  Sudden numbness in arms or legs:     Sudden onset of difficulty speaking or slurred speech:    Temporary loss of vision in one eye:     Problems with dizziness:         Gastrointestinal    Blood in stool:     Vomited blood:         Genitourinary    Burning when urinating:     Blood in urine:        Psychiatric    Major depression:         Hematologic    Bleeding problems:    Problems with blood clotting too easily:        Skin    Rashes or ulcers:        Constitutional    Fever or chills:      PHYSICAL EXAM:   Vitals:   10/13/23 1028  BP: 132/65  Pulse: (!) 57  Temp: 98 F (36.7 C)  SpO2: 96%  Weight: 140 lb (63.5 kg)  Height: 6' (1.829 m)    GENERAL: The patient is a well-nourished male, in no acute distress. The vital signs are documented above. CARDIAC: There is a regular rate and rhythm. PULMONARY: Non-labored respirations ABDOMEN: Soft and non-tender with normal pitched bowel sounds.  MUSCULOSKELETAL: There are no major deformities or cyanosis. NEUROLOGIC: No focal weakness or paresthesias are detected. SKIN: There are no ulcers or rashes noted. PSYCHIATRIC: The patient has a normal affect.  STUDIES:   I have reviewed the following CTA:  1. Postsurgical changes from fenestrated stent graft placement in the abdominal aorta with bilateral renal fenestrations. The infrarenal component of the aneurysm is decreased in size currently measuring 5.4 cm, previously 7.5 cm. 2. There is an endo leak at the distal aspect of the left main renal stent graft landing zone with contrast accumulation within the aneurysm sac at this site. The aneurysm sac at this site is increasing in size measuring approximately 5.5 cm, previously 4.9 cm. 3. There is also a short segment of contrast accumulation external to the stent graft struts in the proximal position as annotated and detailed above.  MEDICAL ISSUES:    AAA: The patient now shows a type Ia endoleak coming around the scallop from his SMA.  I discussed neck step would be angiography via femoral approach.  I will discuss SMA stenting however he may require a redo fenestrated aneurysm repair.  I will get his angiogram done within the next few weeks.  All questions answered    Marti Slates, MD, FACS [name] [phone] Pager [phone]

## 2023-10-14 NOTE — Telephone Encounter (Signed)
 Medication discontinued 07/17/23

## 2023-10-28 ENCOUNTER — Ambulatory Visit (HOSPITAL_COMMUNITY): Admission: RE | Admit: 2023-10-28 | Source: Home / Self Care | Admitting: Surgery

## 2023-10-28 ENCOUNTER — Encounter (HOSPITAL_COMMUNITY): Admission: RE | Payer: Self-pay | Source: Home / Self Care

## 2023-10-28 SURGERY — ABDOMINAL AORTOGRAM W/LOWER EXTREMITY
Anesthesia: LOCAL

## 2023-10-29 ENCOUNTER — Other Ambulatory Visit: Payer: Self-pay

## 2023-10-29 ENCOUNTER — Emergency Department (HOSPITAL_COMMUNITY)
Admission: EM | Admit: 2023-10-29 | Discharge: 2023-10-30 | Disposition: A | Discharge status: Home / Self Care | Attending: Emergency Medicine | Admitting: Emergency Medicine

## 2023-10-29 DIAGNOSIS — R519 Headache, unspecified: Secondary | ICD-10-CM | POA: Diagnosis not present

## 2023-10-29 DIAGNOSIS — I6523 Occlusion and stenosis of bilateral carotid arteries: Secondary | ICD-10-CM | POA: Diagnosis not present

## 2023-10-29 DIAGNOSIS — M542 Cervicalgia: Secondary | ICD-10-CM | POA: Diagnosis not present

## 2023-10-29 DIAGNOSIS — I672 Cerebral atherosclerosis: Secondary | ICD-10-CM | POA: Diagnosis not present

## 2023-10-29 DIAGNOSIS — G4489 Other headache syndrome: Secondary | ICD-10-CM | POA: Diagnosis not present

## 2023-10-29 DIAGNOSIS — I6503 Occlusion and stenosis of bilateral vertebral arteries: Secondary | ICD-10-CM | POA: Insufficient documentation

## 2023-10-29 DIAGNOSIS — M436 Torticollis: Secondary | ICD-10-CM | POA: Insufficient documentation

## 2023-10-29 DIAGNOSIS — Z7982 Long term (current) use of aspirin: Secondary | ICD-10-CM | POA: Diagnosis not present

## 2023-10-29 LAB — CBC WITH DIFFERENTIAL/PLATELET
Abs Immature Granulocytes: 0.01 10*3/uL (ref 0.00–0.07)
Basophils Absolute: 0 10*3/uL (ref 0.0–0.1)
Basophils Relative: 0 %
Eosinophils Absolute: 0.1 10*3/uL (ref 0.0–0.5)
Eosinophils Relative: 1 %
HCT: 34.6 % — ABNORMAL LOW (ref 39.0–52.0)
Hemoglobin: 10.9 g/dL — ABNORMAL LOW (ref 13.0–17.0)
Immature Granulocytes: 0 %
Lymphocytes Relative: 18 %
Lymphs Abs: 0.9 10*3/uL (ref 0.7–4.0)
MCH: 30.4 pg (ref 26.0–34.0)
MCHC: 31.5 g/dL (ref 30.0–36.0)
MCV: 96.4 fL (ref 80.0–100.0)
Monocytes Absolute: 0.4 10*3/uL (ref 0.1–1.0)
Monocytes Relative: 9 %
Neutro Abs: 3.6 10*3/uL (ref 1.7–7.7)
Neutrophils Relative %: 72 %
Platelets: 73 10*3/uL — ABNORMAL LOW (ref 150–400)
RBC: 3.59 MIL/uL — ABNORMAL LOW (ref 4.22–5.81)
RDW: 15.9 % — ABNORMAL HIGH (ref 11.5–15.5)
WBC: 5 10*3/uL (ref 4.0–10.5)
nRBC: 0 % (ref 0.0–0.2)

## 2023-10-29 LAB — COMPREHENSIVE METABOLIC PANEL WITH GFR
ALT: 22 U/L (ref 0–44)
AST: 32 U/L (ref 15–41)
Albumin: 3.3 g/dL — ABNORMAL LOW (ref 3.5–5.0)
Alkaline Phosphatase: 53 U/L (ref 38–126)
Anion gap: 8 (ref 5–15)
BUN: 20 mg/dL (ref 8–23)
CO2: 24 mmol/L (ref 22–32)
Calcium: 8.6 mg/dL — ABNORMAL LOW (ref 8.9–10.3)
Chloride: 108 mmol/L (ref 98–111)
Creatinine, Ser: 1.73 mg/dL — ABNORMAL HIGH (ref 0.61–1.24)
GFR, Estimated: 40 mL/min — ABNORMAL LOW (ref >60)
Glucose, Bld: 102 mg/dL — ABNORMAL HIGH (ref 70–99)
Potassium: 3.3 mmol/L — ABNORMAL LOW (ref 3.5–5.1)
Sodium: 140 mmol/L (ref 135–145)
Total Bilirubin: 0.5 mg/dL (ref 0.0–1.2)
Total Protein: 7 g/dL (ref 6.5–8.1)

## 2023-10-29 MED ORDER — OXYCODONE-ACETAMINOPHEN 5-325 MG PO TABS
1.0000 | ORAL_TABLET | Freq: Once | ORAL | Status: AC
  Administered 2023-10-29: 1 via ORAL
  Filled 2023-10-29: qty 1

## 2023-10-29 NOTE — ED Triage Notes (Signed)
 Patient arrived with EMS from home reports persistent headache with left lateral neck pain since last night unrelieved by prescription medications /denies injury.

## 2023-10-29 NOTE — ED Provider Triage Note (Signed)
 Emergency Medicine Provider Triage Evaluation Note  [name] , a 78 y.o. male  was evaluated in triage.  Pt complains of HA and L sided neck pain x 1 day.  Review of Systems  Positive: Neck pain, HA Negative: Vision changes, fever, chills, N/V, injury, dizziness  Physical Exam  BP 135/72 (BP Location: Right Arm)   Pulse (!) 55   Temp 97.8 F (36.6 C) (Oral)   Resp 16   SpO2 98%  Gen:   Awake, no distress   Resp:  Normal effort  MSK:   Moves extremities without difficulty Other:    Medical Decision Making  Medically screening exam initiated at 7:56 PM.  Appropriate orders placed.  [name] was informed that the remainder of the evaluation will be completed by another provider, this initial triage assessment does not replace that evaluation, and the importance of remaining in the ED until their evaluation is complete.  Labs ordered   Carie Charity, PA-C 10/29/23 1610

## 2023-10-30 ENCOUNTER — Emergency Department (HOSPITAL_COMMUNITY)

## 2023-10-30 ENCOUNTER — Encounter (HOSPITAL_COMMUNITY): Payer: Self-pay

## 2023-10-30 DIAGNOSIS — I672 Cerebral atherosclerosis: Secondary | ICD-10-CM | POA: Diagnosis not present

## 2023-10-30 DIAGNOSIS — I6503 Occlusion and stenosis of bilateral vertebral arteries: Secondary | ICD-10-CM | POA: Diagnosis not present

## 2023-10-30 DIAGNOSIS — R519 Headache, unspecified: Secondary | ICD-10-CM | POA: Diagnosis not present

## 2023-10-30 DIAGNOSIS — M436 Torticollis: Secondary | ICD-10-CM | POA: Diagnosis not present

## 2023-10-30 DIAGNOSIS — I6523 Occlusion and stenosis of bilateral carotid arteries: Secondary | ICD-10-CM | POA: Diagnosis not present

## 2023-10-30 MED ORDER — DIAZEPAM 5 MG PO TABS: 2.5000 mg | ORAL_TABLET | Freq: Two times a day (BID) | ORAL | 0 refills | Status: DC | PRN

## 2023-10-30 MED ORDER — 1ST MEDX-PATCH/ LIDOCAINE 4-0.025-5-20 % EX PTCH: 1.0000 | MEDICATED_PATCH | Freq: Every day | CUTANEOUS | 0 refills | Status: AC | PRN

## 2023-10-30 MED ORDER — LIDOCAINE 5 % EX PTCH
1.0000 | MEDICATED_PATCH | CUTANEOUS | Status: DC
  Administered 2023-10-30: 1 via TRANSDERMAL
  Filled 2023-10-30: qty 1

## 2023-10-30 MED ORDER — ACETAMINOPHEN 500 MG PO TABS
1000.0000 mg | ORAL_TABLET | Freq: Once | ORAL | Status: AC
  Administered 2023-10-30: 1000 mg via ORAL
  Filled 2023-10-30: qty 2

## 2023-10-30 MED ORDER — IOHEXOL 350 MG/ML SOLN
75.0000 mL | Freq: Once | INTRAVENOUS | Status: AC | PRN
  Administered 2023-10-30: 75 mL via INTRAVENOUS

## 2023-10-30 MED ORDER — DIAZEPAM 2 MG PO TABS
2.0000 mg | ORAL_TABLET | Freq: Once | ORAL | Status: AC
  Administered 2023-10-30: 2 mg via ORAL
  Filled 2023-10-30: qty 1

## 2023-10-30 NOTE — ED Notes (Signed)
 3 unsuccessful IV sticks, IV team consulted

## 2023-10-30 NOTE — ED Notes (Signed)
 This RN reviewed discharge instructions with patient. He verbalized understanding and denied any further questions. PT well appearing upon discharge and reports no pain. Pt ambulated with stable gait to exit. Pt endorses ride home.

## 2023-10-30 NOTE — ED Provider Notes (Signed)
 [place] EMERGENCY DEPARTMENT AT [facility] Provider Note   CSN: [id number] Arrival date & time: 10/29/23  1911     History  Chief Complaint  Patient presents with   Headache Karry Paganini Pain     CHRISTINO BAUMGART is a 78 y.o. male.  Here with acute onset of neck pain. Was on left then on right as well then progressed to headache. Noticed initial discomfort when awakening approx 36 hours ago. Progressively worsened. Tried nsaids, heat at home as he thought it was a muscle spasm but these did  not help. No real headache, neuro changes or other associated symptoms. No trauma, recent chiro appt or other initiating causes.         Home Medications Prior to Admission medications   Medication Sig Start Date End Date Taking? Authorizing Provider  diazepam (VALIUM) 5 MG tablet Take 0.5 tablets (2.5 mg total) by mouth every 12 (twelve) hours as needed for muscle spasms. 10/30/23  Yes Manvi Guilliams, Reymundo Caulk, MD  Lido-Capsaicin-Men-Methyl Sal (1ST MEDX-PATCH/ LIDOCAINE ) 4-0.025-5-20 % PTCH Apply 1 patch topically daily as needed for up to 10 days (pain). 10/30/23 11/09/23 Yes Cohan Stipes, Reymundo Caulk, MD  albuterol  (VENTOLIN  HFA) 108 (90 Base) MCG/ACT inhaler INHALE 1-2 PUFFS BY MOUTH EVERY 6 HOURS AS NEEDED FOR WHEEZE OR SHORTNESS OF BREATH 08/11/23   Bevelyn Bryant, MD  amLODipine  (NORVASC ) 10 MG tablet Take 1 tablet (10 mg total) by mouth daily. 07/03/23   Ether Hercules, MD  aspirin  EC 81 MG tablet Take 1 tablet (81 mg total) by mouth daily. Swallow whole. Patient taking differently: Take 81 mg by mouth at bedtime. Swallow whole. 04/14/23   Margherita Shell, MD  clopidogrel  (PLAVIX ) 75 MG tablet TAKE 1 TABLET BY MOUTH EVERY DAY 07/29/23   Ether Hercules, MD  escitalopram  (LEXAPRO ) 5 MG tablet TAKE 1 TABLET (5 MG TOTAL) BY MOUTH DAILY. 07/29/23 01/25/24  Ether Hercules, MD  HYDROcodone -acetaminophen  (NORCO) 7.5-325 MG tablet Take 1 tablet by mouth 2 (two) times daily as needed for moderate  pain (pain score 4-6). 10/07/23   Priscella Brooms, DO  metoprolol  succinate (TOPROL -XL) 25 MG 24 hr tablet Take 1 tablet (25 mg total) by mouth daily. 09/18/23 09/17/24  Priscella Brooms, DO  mirtazapine  (REMERON ) 7.5 MG tablet TAKE 2 TABLETS (15 MG TOTAL) BY MOUTH AT BEDTIME. 06/25/23   Ether Hercules, MD  Pancrelipase , Lip-Prot-Amyl, (ZENPEP ) 20000-63000 units CPEP Take 3 capsules by mouth 3 (three) times daily before meals. 06/05/23   Ether Hercules, MD  pantoprazole  (PROTONIX ) 40 MG tablet TAKE 1 TABLET EVERY DAY FOR REFLUX 10/21/22   Ether Hercules, MD  rosuvastatin  (CRESTOR ) 40 MG tablet Take 1 tablet (40 mg total) by mouth daily. 04/03/23   Ether Hercules, MD  sacubitril -valsartan  (ENTRESTO ) 97-103 MG Take 1 tablet by mouth 2 (two) times daily. 08/04/23   Ether Hercules, MD  senna (SENOKOT) 8.6 MG TABS tablet Take 2 tablets (17.2 mg total) by mouth in the morning and at bedtime. Patient taking differently: Take 1 tablet by mouth in the morning and at bedtime. 06/29/21   Ether Hercules, MD  Tiotropium Bromide Monohydrate  (SPIRIVA  RESPIMAT) 2.5 MCG/ACT AERS Inhale 2 puffs into the lungs daily. 08/04/23   Ether Hercules, MD      Allergies    Candesartan cilexetil-hctz, Hydrochlorothiazide, Shellfish allergy, and Betadine [povidone iodine ]    Review of Systems   Review of Systems  Physical Exam Updated Vital Signs BP 128/71 (  BP Location: Right Arm)   Pulse (!) 57   Temp 97.7 F (36.5 C) (Oral)   Resp 18   Ht 6' (1.829 m)   Wt 63.5 kg   SpO2 100%   BMI 18.99 kg/m  Physical Exam Vitals and nursing note reviewed.  Constitutional:      Appearance: He is well-developed.  HENT:     Head: Normocephalic and atraumatic.  Cardiovascular:     Rate and Rhythm: Normal rate.  Pulmonary:     Effort: Pulmonary effort is normal. No respiratory distress.  Abdominal:     General: There is no distension.  Musculoskeletal:        General: Normal  range of motion.     Cervical back: Normal range of motion.  Skin:    General: Skin is warm and dry.  Neurological:     Mental Status: He is alert and oriented to person, place, and time. Mental status is at baseline.     Sensory: No sensory deficit.     Motor: No weakness.     ED Results / Procedures / Treatments   Labs (all labs ordered are listed, but only abnormal results are displayed) Labs Reviewed  CBC WITH DIFFERENTIAL/PLATELET - Abnormal; Notable for the following components:      Result Value   RBC 3.59 (*)    Hemoglobin 10.9 (*)    HCT 34.6 (*)    RDW 15.9 (*)    Platelets 73 (*)    All other components within normal limits  COMPREHENSIVE METABOLIC PANEL WITH GFR - Abnormal; Notable for the following components:   Potassium 3.3 (*)    Glucose, Bld 102 (*)    Creatinine, Ser 1.73 (*)    Calcium  8.6 (*)    Albumin  3.3 (*)    GFR, Estimated 40 (*)    All other components within normal limits    EKG None  Radiology CT ANGIO HEAD NECK W WO CM Result Date: 10/30/2023 CLINICAL DATA:  Headache and left-sided neck pain EXAM: CT ANGIOGRAPHY HEAD AND NECK WITH AND WITHOUT CONTRAST TECHNIQUE: Multidetector CT imaging of the head and neck was performed using the standard protocol during bolus administration of intravenous contrast. Multiplanar CT image reconstructions and MIPs were obtained to evaluate the vascular anatomy. Carotid stenosis measurements (when applicable) are obtained utilizing NASCET criteria, using the distal internal carotid diameter as the denominator. RADIATION DOSE REDUCTION: This exam was performed according to the departmental dose-optimization program which includes automated exposure control, adjustment of the mA and/or kV according to patient size and/or use of iterative reconstruction technique. CONTRAST:  75mL OMNIPAQUE  IOHEXOL  350 MG/ML SOLN COMPARISON:  08/16/2018 FINDINGS: CT HEAD FINDINGS Brain: No mass,hemorrhage or extra-axial collection. Normal  appearance of the parenchyma and CSF spaces. Vascular: Atherosclerotic calcification of the vertebral and internal carotid arteries at the skull base. No abnormal hyperdensity of the major intracranial arteries or dural venous sinuses. Skull: The visualized skull base, calvarium and extracranial soft tissues are normal. Sinuses/Orbits: No fluid levels or advanced mucosal thickening of the visualized paranasal sinuses. No mastoid or middle ear effusion. Normal orbits. CTA NECK FINDINGS Skeleton: No acute abnormality or high grade bony spinal canal stenosis. Other neck: Normal pharynx, larynx and major salivary glands. No cervical lymphadenopathy. Unremarkable thyroid  gland. Upper chest: No pneumothorax or pleural effusion. No nodules or masses. Aortic arch: There is calcific atherosclerosis of the aortic arch. Normal variant aortic arch branching pattern with the brachiocephalic and left common carotid arteries sharing a common  origin. RIGHT carotid system: Multifocal atherosclerotic calcification without hemodynamically significant stenosis. LEFT carotid system: Multifocal atherosclerotic calcification without hemodynamically significant stenosis. Vertebral arteries: Right dominant configuration. Moderate atherosclerosis of the left V2 segment at the C3-4 level. Otherwise unremarkable. CTA HEAD FINDINGS POSTERIOR CIRCULATION: Bilateral calcific atherosclerosis with severe stenosis of the distal left V4 segment. Mild stenosis of the distal right V4 segment. No proximal occlusion of the anterior or inferior cerebellar arteries. Basilar artery is normal. Superior cerebellar arteries are normal. Posterior cerebral arteries are normal. ANTERIOR CIRCULATION: Left-greater-than-right atherosclerotic calcification of the cavernous internal carotid arteries. No high-grade stenosis. Anterior cerebral arteries are normal. Middle cerebral arteries are normal. Venous sinuses: As permitted by contrast timing, patent. Anatomic  variants: None Review of the MIP images confirms the above findings. IMPRESSION: 1. No emergent large vessel occlusion. 2. Bilateral stenosis of the vertebral artery V4 segments. 3. Bilateral carotid bifurcation atherosclerosis without hemodynamically significant stenosis. Aortic Atherosclerosis (ICD10-I70.0). Electronically Signed   By: Juanetta Nordmann M.D.   On: 10/30/2023 03:35    Procedures Procedures    Medications Ordered in ED Medications  lidocaine  (LIDODERM ) 5 % 1 patch (1 patch Transdermal Patch Applied 10/30/23 0637)  oxyCODONE -acetaminophen  (PERCOCET/ROXICET) 5-325 MG per tablet 1 tablet (1 tablet Oral Given 10/29/23 1933)  acetaminophen  (TYLENOL ) tablet 1,000 mg (1,000 mg Oral Given 10/30/23 0157)  diazepam (VALIUM) tablet 2 mg (2 mg Oral Given 10/30/23 0157)  iohexol  (OMNIPAQUE ) 350 MG/ML injection 75 mL (75 mLs Intravenous Contrast Given 10/30/23 0324)    ED Course/ Medical Decision Making/ A&P                                 Medical Decision Making Amount and/or Complexity of Data Reviewed Labs: ordered. Radiology: ordered.  Risk OTC drugs. Prescription drug management.   Cta done to eval for arterial dissection since not improving with appropriate at home treatments and negative angio read by radiology for dissection but does have some stenosis. I don't think this is the cause for his symptoms today as he improved with muscle relaxant and isn't having any posterior symptoms. Is already on aspirin  and plavix  at home so will defer to PCP for further monitoring, no acute intervention indicated at this time. Stable for d/c with MSK treatment.   Final Clinical Impression(s) / ED Diagnoses Final diagnoses:  Torticollis  Stenosis of both vertebral arteries    Rx / DC Orders ED Discharge Orders          Ordered    diazepam (VALIUM) 5 MG tablet  Every 12 hours PRN        10/30/23 0614    Lido-Capsaicin-Men-Methyl Sal (1ST MEDX-PATCH/ LIDOCAINE ) 4-0.025-5-20 % PTCH  Daily  PRN        10/30/23 0614              Cavion Faiola, MD 10/30/23 2317

## 2023-10-31 DIAGNOSIS — R0689 Other abnormalities of breathing: Secondary | ICD-10-CM | POA: Diagnosis not present

## 2023-10-31 DIAGNOSIS — R531 Weakness: Secondary | ICD-10-CM | POA: Diagnosis not present

## 2023-10-31 DIAGNOSIS — R509 Fever, unspecified: Secondary | ICD-10-CM | POA: Diagnosis not present

## 2023-10-31 DIAGNOSIS — M542 Cervicalgia: Secondary | ICD-10-CM | POA: Diagnosis not present

## 2023-11-01 ENCOUNTER — Encounter (HOSPITAL_COMMUNITY): Payer: Self-pay

## 2023-11-01 ENCOUNTER — Other Ambulatory Visit: Payer: Self-pay

## 2023-11-01 ENCOUNTER — Emergency Department (HOSPITAL_COMMUNITY)
Admission: EM | Admit: 2023-11-01 | Discharge: 2023-11-01 | Disposition: A | Discharge status: Home / Self Care | Attending: Emergency Medicine | Admitting: Emergency Medicine

## 2023-11-01 DIAGNOSIS — F17210 Nicotine dependence, cigarettes, uncomplicated: Secondary | ICD-10-CM | POA: Insufficient documentation

## 2023-11-01 DIAGNOSIS — M542 Cervicalgia: Secondary | ICD-10-CM | POA: Insufficient documentation

## 2023-11-01 DIAGNOSIS — Z85118 Personal history of other malignant neoplasm of bronchus and lung: Secondary | ICD-10-CM | POA: Diagnosis not present

## 2023-11-01 DIAGNOSIS — I10 Essential (primary) hypertension: Secondary | ICD-10-CM | POA: Diagnosis not present

## 2023-11-01 DIAGNOSIS — I2510 Atherosclerotic heart disease of native coronary artery without angina pectoris: Secondary | ICD-10-CM | POA: Insufficient documentation

## 2023-11-01 MED ORDER — LIDOCAINE 5 % EX PTCH
1.0000 | MEDICATED_PATCH | CUTANEOUS | Status: DC
  Administered 2023-11-01: 1 via TRANSDERMAL
  Filled 2023-11-01: qty 1

## 2023-11-01 MED ORDER — OXYCODONE HCL 5 MG PO TABS
5.0000 mg | ORAL_TABLET | Freq: Once | ORAL | Status: AC
  Administered 2023-11-01: 5 mg via ORAL
  Filled 2023-11-01: qty 1

## 2023-11-01 MED ORDER — LIDOCAINE 5 % EX PTCH: 1.0000 | MEDICATED_PATCH | CUTANEOUS | 0 refills | Status: DC

## 2023-11-01 NOTE — ED Triage Notes (Signed)
 Patient BIB [facility] from home with complaint of neck pain, headache, decreased appetite. Patient recently seen for dam, diagnosed with torticollis and discharged with prescription. Patient reports no relief of neck pain, states that he is unable to move head side to side or un and down.   Patient presents a febrile, A & O x 4.

## 2023-11-01 NOTE — Discharge Instructions (Signed)
 You were evaluated in the Emergency Department and after careful evaluation, we did not find any emergent condition requiring admission or further testing in the hospital.  Your exam/testing today is overall reassuring.  Symptoms likely due to muscle strain or spasm of the neck.  Use the numbing patches daily to help with pain as well as your other home pain medications.  Please return to the Emergency Department if you experience any worsening of your condition.   Thank you for allowing us  to be a part of your care.

## 2023-11-01 NOTE — ED Notes (Signed)
 Placed patent on 2L [place] due to O2 sat 88% on RA.

## 2023-11-01 NOTE — ED Provider Notes (Signed)
 [facility] DEPT [facility] Emergency Department Provider Note MRN:  [id number]  Arrival date & time: 11/01/23     Chief Complaint   Neck pain History of Present Illness   [name] is a 78 y.o. year-old male with a history of AAA, MI presenting to the ED with chief complaint of neck pain.  Continued neck pain over the past 3 to 4 days, not getting any better.  Denies trauma to the neck.  Cannot move the neck side-to-side, has to move his whole body to look at things which is annoying to him.  Hurts a lot to try to move the neck.  Denies numbness or weakness to the arms or legs, no bowel or bladder dysfunction, does not have headache, no trouble with speech or vision, denies fever.  Review of Systems  A thorough review of systems was obtained and all systems are negative except as noted in the HPI and PMH.   Patient's Health History    Past Medical History:  Diagnosis Date   AAA (abdominal aortic aneurysm) (HCC)    followed by vasc surgery   Alcohol abuse    Barretts esophagus    bx distal esophagus 2011 neg    Bladder outlet obstruction 04/02/2018   CAD in native artery    a. s/p stent to prox RCA and mid Cx.   Cataract    Cholelithiasis    Chronic osteomyelitis involving ankle and foot, right (HCC) 07/13/2018   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   2006 - 3 adenomas largest 12 mm  2008 - no polyps 2013-  2 mm cecal polyp, adenoma, routine repeat colonoscopy 02/2017 approx 05/14/2017 18 polyps max 12 mm ALL adenomas recall 2019 Kenney Peacemaker, MD, [facility]      Dieulafoy lesion of duodenum    Diverticulosis of colon    DJD (degenerative joint disease)    left hip   Full dentures    upper and lower   GERD (gastroesophageal reflux disease)    diet controlled    Gout    feet   Hiatal hernia    2 cm noted on upper endos 2011    Hx of adenomatous colonic polyps    Hyperlipidemia    Hypertension    Internal hemorrhoid    Lung cancer (HCC)    squamous cell  (T2N0MX), 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 pt was on Plavix  x 3 years then transitioned to ASA, no problems since   Non-small cell carcinoma of right lung, stage 1 (HCC) 01/30/2016   Renal failure, acute (HCC)    hx of 2/2 medication, resolved   Tobacco abuse    smoker .5 ppd x 55 yrs   UTI (lower urinary tract infection)     Past Surgical History:  Procedure Laterality Date   ABDOMINAL AORTIC ENDOVASCULAR FENESTRATED STENT GRAFT N/A 08/16/2022   Procedure: ABDOMINAL AORTIC ENDOVASCULAR FENESTRATED STENT GRAFT;  Surgeon: Margherita Shell, MD;  Location: [facility] OR;  Service: Vascular;  Laterality: N/A;   BIOPSY  11/12/2020   Procedure: BIOPSY;  Surgeon: Albertina Hugger, MD;  Location: [facility] ENDOSCOPY;  Service: Gastroenterology;;   BIOPSY  07/25/2021   Procedure: BIOPSY;  Surgeon: Elois Hair, MD;  Location: [facility] ENDOSCOPY;  Service: Gastroenterology;;   BIOPSY  12/24/2021   Procedure: BIOPSY;  Surgeon: Normie Becton., MD;  Location: [facility] ENDOSCOPY;  Service: Gastroenterology;;   BRONCHIAL BIOPSY  07/27/2021   Procedure: BRONCHIAL BIOPSIES;  Surgeon: Denson Flake, MD;  Location: [facility] ENDOSCOPY;  Service: Cardiopulmonary;;   BRONCHIAL BRUSHINGS  07/27/2021   Procedure: BRONCHIAL BRUSHINGS;  Surgeon: Denson Flake, MD;  Location: [facility] ENDOSCOPY;  Service: Cardiopulmonary;;   cataract surgery Bilateral 2018   removal of cataracts   COLONOSCOPY     hx polyps - Willy Harvest 02/2012   COLONOSCOPY WITH PROPOFOL  N/A 11/12/2020   Procedure: COLONOSCOPY WITH PROPOFOL ;  Surgeon: Albertina Hugger, MD;  Location: [facility] ENDOSCOPY;  Service: Gastroenterology;  Laterality: N/A;   CORONARY ANGIOPLASTY WITH STENT PLACEMENT     ENDOVASCULAR STENT INSERTION Bilateral 08/16/2022   Procedure: ENDOVASCULAR STENT GRAFT INSERTION OF BILATERAL RENAL ARTERIES;  Surgeon: Margherita Shell, MD;  Location: [facility] OR;  Service: Vascular;  Laterality: Bilateral;    ESOPHAGOGASTRODUODENOSCOPY (EGD) WITH PROPOFOL  N/A 11/12/2020   Procedure: ESOPHAGOGASTRODUODENOSCOPY (EGD) WITH PROPOFOL ;  Surgeon: Albertina Hugger, MD;  Location: [facility] ENDOSCOPY;  Service: Gastroenterology;  Laterality: N/A;   ESOPHAGOGASTRODUODENOSCOPY (EGD) WITH PROPOFOL  N/A 03/06/2021   Procedure: ESOPHAGOGASTRODUODENOSCOPY (EGD) WITH PROPOFOL ;  Surgeon: Ace Holder, MD;  Location: [facility] ENDOSCOPY;  Service: Gastroenterology;  Laterality: N/A;   ESOPHAGOGASTRODUODENOSCOPY (EGD) WITH PROPOFOL  N/A 07/25/2021   Procedure: ESOPHAGOGASTRODUODENOSCOPY (EGD) WITH PROPOFOL ;  Surgeon: Elois Hair, MD;  Location: [facility] ENDOSCOPY;  Service: Gastroenterology;  Laterality: N/A;   ESOPHAGOGASTRODUODENOSCOPY (EGD) WITH PROPOFOL  N/A 12/24/2021   Procedure: ESOPHAGOGASTRODUODENOSCOPY (EGD) WITH PROPOFOL ;  Surgeon: Brice Campi Albino Alu., MD;  Location: [facility] ENDOSCOPY;  Service: Gastroenterology;  Laterality: N/A;   flexible cystourethroscopy  05/24/06   HEMOSTASIS CLIP PLACEMENT  03/06/2021   Procedure: HEMOSTASIS CLIP PLACEMENT;  Surgeon: Ace Holder, MD;  Location: [facility] ENDOSCOPY;  Service: Gastroenterology;;   HEMOSTASIS CLIP PLACEMENT  12/24/2021   Procedure: HEMOSTASIS CLIP PLACEMENT;  Surgeon: Normie Becton., MD;  Location: [facility] ENDOSCOPY;  Service: Gastroenterology;;   HOT HEMOSTASIS N/A 03/06/2021   Procedure: HOT HEMOSTASIS (ARGON PLASMA COAGULATION/BICAP);  Surgeon: Ace Holder, MD;  Location: [facility] ENDOSCOPY;  Service: Gastroenterology;  Laterality: N/A;   INGUINAL HERNIA REPAIR  2005   INSERTION OF ILIAC STENT Bilateral 08/16/2022   Procedure: INSERTION OF ILIAC STENT RIGHT EXTERNAL ARTERY, LEFT COMMON ARTERY, AND RIGHT COMMON ARTERY;  Surgeon: Margherita Shell, MD;  Location[facility] OR;  Service: Vascular;  Laterality: Bilateral;   IR CATHETER TUBE CHANGE  07/08/2022   LUNG LOBECTOMY Right 11/07   RUL   MULTIPLE TOOTH EXTRACTIONS     POLYPECTOMY  11/12/2020   Procedure:  POLYPECTOMY;  Surgeon: Albertina Hugger, MD;  Location: [facility] ENDOSCOPY;  Service: Gastroenterology;;   POLYPECTOMY  12/24/2021   Procedure: POLYPECTOMY;  Surgeon: Normie Becton., MD;  Location: [facility] ENDOSCOPY;  Service: Gastroenterology;;   SUBMUCOSAL LIFTING INJECTION  12/24/2021   Procedure: SUBMUCOSAL LIFTING INJECTION;  Surgeon: Normie Becton., MD;  Location: [facility] ENDOSCOPY;  Service: Gastroenterology;;   TRANSURETHRAL RESECTION OF PROSTATE N/A 05/02/2021   Procedure: TRANSURETHRAL RESECTION OF THE PROSTATE (TURP);  Surgeon: Samson Croak, MD;  Location: [facility] ORS;  Service: Urology;  Laterality: N/A;   ULTRASOUND GUIDANCE FOR VASCULAR ACCESS Bilateral 08/16/2022   Procedure: ULTRASOUND GUIDANCE FOR VASCULAR ACCESS;  Surgeon: Margherita Shell, MD;  [facility] OR;  Service: Vascular;  Laterality: Bilateral;   UPPER ESOPHAGEAL ENDOSCOPIC ULTRASOUND (EUS) N/A 12/24/2021   Procedure: UPPER ESOPHAGEAL ENDOSCOPIC ULTRASOUND (EUS);  Surgeon: Normie Becton., MD;  Location: [facility] ENDOSCOPY;  Service: Gastroenterology;  Laterality: N/A;   UPPER  GASTROINTESTINAL ENDOSCOPY     VIDEO BRONCHOSCOPY  07/27/2021   Procedure: VIDEO BRONCHOSCOPY WITHOUT FLUORO;  Surgeon: Denson Flake, MD;  Location: [facility] ENDOSCOPY;  Service: Cardiopulmonary;;    Family History  Problem Relation Age of Onset   Diabetes Mother    Hypertension Sister    Heart disease Sister    Diabetes Sister    Arthritis Brother    Gout Brother    Colon cancer Neg Hx    Esophageal cancer Neg Hx    Rectal cancer Neg Hx    Stomach cancer Neg Hx    Colon polyps Neg Hx    Inflammatory bowel disease Neg Hx    Liver disease Neg Hx    Pancreatic cancer Neg Hx     Social History   Socioeconomic History   Marital status: Single    Spouse name: Not on file   Number of children: 0   Years of education: Not on file   Highest education level: Not on file  Occupational History   Not on file  Tobacco Use   Smoking  status: Every Day    Current packs/day: 0.75    Average packs/day: 0.8 packs/day for 55.0 years (41.3 ttl pk-yrs)    Types: Cigarettes   Smokeless tobacco: Never   Tobacco comments:    Smokes 8-9 cigarettes per day  Vaping Use   Vaping status: Never Used  Substance and Sexual Activity   Alcohol use: Not Currently   Drug use: No   Sexual activity: Not on file  Other Topics Concern   Not on file  Social History Narrative   Patient is single reports no children   Smoker, no current alcohol or drug use   Social Drivers of Corporate investment banker Strain: Low Risk  (09/10/2022)   Overall Financial Resource Strain (CARDIA)    Difficulty of Paying Living Expenses: Not hard at all  Recent Concern: Financial Resource Strain - Medium Risk (08/19/2022)   Overall Financial Resource Strain (CARDIA)    Difficulty of Paying Living Expenses: Somewhat hard  Food Insecurity: No Food Insecurity (06/01/2023)   Hunger Vital Sign    Worried About Running Out of Food in the Last Year: Never true    Ran Out of Food in the Last Year: Never true  Transportation Needs: No Transportation Needs (06/01/2023)   PRAPARE - Administrator, Civil Service (Medical): No    Lack of Transportation (Non-Medical): No  Physical Activity: Inactive (09/10/2022)   Exercise Vital Sign    Days of Exercise per Week: 0 days    Minutes of Exercise per Session: 0 min  Stress: No Stress Concern Present (09/10/2022)   [employer] of Occupational Health - Occupational Stress Questionnaire    Feeling of Stress : Only a little  Social Connections: Moderately Isolated (09/10/2022)   Social Connection and Isolation Panel [NHANES]    Frequency of Communication with Friends and Family: More than three times a week    Frequency of Social Gatherings with Friends and Family: Three times a week    Attends Religious Services: More than 4 times per year    Active Member of Clubs or Organizations: No    Attends Tax inspector Meetings: Never    Marital Status: Divorced  Catering manager Violence: Not At Risk (06/01/2023)   Humiliation, Afraid, Rape, and Kick questionnaire    Fear of Current or Ex-Partner: No    Emotionally Abused: No    Physically Abused:  No    Sexually Abused: No     Physical Exam   Vitals:   11/01/23 0145 11/01/23 0200  BP: (!) 155/90 (!) 141/92  Pulse:    Resp: 16 19  Temp:    SpO2:  98%    CONSTITUTIONAL: Well-appearing, NAD NEURO/PSYCH:  Alert and oriented x 3, normal and symmetric strength and sensation, normal coordination, normal speech EYES:  eyes equal and reactive ENT/NECK:  no LAD, no JVD CARDIO: Regular rate, well-perfused, normal S1 and S2 PULM:  CTAB no wheezing or rhonchi GI/GU:  non-distended, non-tender MSK/SPINE:  No gross deformities, no edema SKIN:  no rash, atraumatic   *Additional and/or pertinent findings included in MDM below  Diagnostic and Interventional Summary    EKG Interpretation Date/Time:  Saturday Nov 01 2023 00:18:43 EDT Ventricular Rate:  73 PR Interval:  215 QRS Duration:  137 QT Interval:  454 QTC Calculation: 501 R Axis:   -59  Text Interpretation: Sinus or ectopic atrial rhythm Multiple premature complexes, vent & supraven Borderline prolonged PR interval Nonspecific IVCD with LAD Left ventricular hypertrophy Anterior infarct, old ST elevation, consider inferior injury Confirmed by Gwenetta Lennert [phone]) on 11/01/2023 1:20:52 AM       Labs Reviewed - No data to display  No orders to display    Medications  lidocaine  (LIDODERM ) 5 % 1 patch (1 patch Transdermal Patch Applied 11/01/23 0115)  oxyCODONE  (Oxy IR/ROXICODONE ) immediate release tablet 5 mg (5 mg Oral Given 11/01/23 0115)     Procedures  /  Critical Care Procedures  ED Course and Medical Decision Making  Initial Impression and Ddx Continued neck pain.  Recent ED visit for the same symptoms, had a reassuring workup with nonacute CTA of the head and neck.   He continues to have pain consistent with cervical strain or spasm.  He has great hesitancy with any lateral movement.  There is an initial chief complaint of fever during patient's triage or registration however patient denies this, has not felt feverish, has no fever here in the emergency department, is very nontoxic-appearing.  I see no evidence of infectious process, highly doubt meningitis.  I asked patient why he called EMS this evening and he explains it simply because the neck pain is not better.  Providing symptomatic management and will reassess.  Currently without indication for labs or repeat imaging.  Past medical/surgical history that increases complexity of ED encounter: Multiple comorbidities  Interpretation of Diagnostics Laboratory and/or imaging options to aid in the diagnosis/care of the patient were considered.  After careful history and physical examination, it was determined that there was no indication for diagnostics at this time.  Patient Reassessment and Ultimate Disposition/Management     Upon reassessment patient's pain is much improved, range of motion much improved, he is happy and comfortable going home.  Appropriate for discharge.  Patient management required discussion with the following services or consulting groups:  None  Complexity of Problems Addressed Acute illness or injury that poses threat of life of bodily function  Additional Data Reviewed and Analyzed Further history obtained from: Prior ED visit notes and Prior labs/imaging results  Additional Factors Impacting ED Encounter Risk Prescriptions  Merrick Abe. Harless Lien, MD [facility] Emergency Medicine [facility] [email or web address]  Final Clinical Impressions(s) / ED Diagnoses     ICD-10-CM   1. Neck pain  M54.2       ED Discharge Orders          Ordered  lidocaine  (LIDODERM ) 5 %  Every 24 hours        11/01/23 0338             Discharge Instructions Discussed  with and Provided to Patient:     Discharge Instructions      You were evaluated in the Emergency Department and after careful evaluation, we did not find any emergent condition requiring admission or further testing in the hospital.  Your exam/testing today is overall reassuring.  Symptoms likely due to muscle strain or spasm of the neck.  Use the numbing patches daily to help with pain as well as your other home pain medications.  Please return to the Emergency Department if you experience any worsening of your condition.   Thank you for allowing us  to be a part of your care.     Edson Graces, MD 11/01/23 [phone]

## 2023-11-03 ENCOUNTER — Encounter: Payer: Self-pay | Admitting: Surgery

## 2023-11-03 ENCOUNTER — Ambulatory Visit: Attending: Surgery | Admitting: Surgery

## 2023-11-03 VITALS — BP 132/76 | HR 70 | Temp 98.6°F | Resp 20 | Ht 72.0 in | Wt 138.9 lb

## 2023-11-03 DIAGNOSIS — I7142 Juxtarenal abdominal aortic aneurysm, without rupture: Secondary | ICD-10-CM | POA: Diagnosis not present

## 2023-11-03 NOTE — Progress Notes (Signed)
 Vascular and Vein Specialist of   Patient name: [name] MRN: [id number] DOB: Dec 16, 1945 Sex: male   REASON FOR VISIT:    Follow-up  HISOTRY OF PRESENT ILLNESS:    [name] is a 78 y.o. male who underwent a fenestrated juxtarenal abdominal aortic aneurysm repair with stenting of his left and right renal arteries on 08/16/2022.  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  His postoperative CT scan shows that his graft is in good position with no evidence of endoleak.  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  I originally scheduled him for angiography but after reviewing this with several colleagues, I felt that this could not be treated with stenting of his SMA but rather a redo Z fashion and so I brought him back today to further discuss this.     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  He has a suprapubic catheter in place.  He suffers from protein calorie malnutrition as well as weight loss.  He has a history of coronary artery disease, status post PCI.   PAST MEDICAL HISTORY:   Past Medical History:  Diagnosis Date   AAA (abdominal aortic aneurysm) (HCC)    followed by vasc surgery   Alcohol abuse    Barretts esophagus    bx distal esophagus 2011 neg    Bladder outlet obstruction 04/02/2018   CAD in native artery    a. s/p stent to prox RCA and mid Cx.   Cataract    Cholelithiasis    Chronic osteomyelitis involving ankle and foot, right (HCC) 07/13/2018   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   2006 - 3 adenomas largest 12 mm  2008 - no polyps 2013-  2 mm cecal polyp, adenoma, routine repeat colonoscopy 02/2017 approx 05/14/2017 18 polyps max 12 mm ALL adenomas recall 2019 Kenney Peacemaker, MD, [facility]      Dieulafoy lesion of duodenum    Diverticulosis of colon    DJD (degenerative  joint disease)    left hip   Full dentures    upper and lower   GERD (gastroesophageal reflux disease)    diet controlled    Gout    feet   Hiatal hernia    2 cm noted on upper endos 2011    Hx of adenomatous colonic polyps    Hyperlipidemia    Hypertension    Internal hemorrhoid    Lung cancer (HCC)    squamous cell (T2N0MX), 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 pt was on Plavix  x 3 years then transitioned to ASA, no problems since   Non-small cell carcinoma of right lung, stage 1 (HCC) 01/30/2016   Renal failure, acute (HCC)    hx of 2/2 medication, resolved   Tobacco abuse    smoker .5 ppd x 55 yrs   UTI (lower urinary tract infection)      FAMILY HISTORY:   Family History  Problem Relation Age of Onset   Diabetes Mother    Hypertension Sister    Heart disease Sister    Diabetes Sister    Arthritis Brother    Gout Brother    Colon cancer Neg Hx    Esophageal cancer Neg Hx    Rectal cancer Neg Hx  Stomach cancer Neg Hx    Colon polyps Neg Hx    Inflammatory bowel disease Neg Hx    Liver disease Neg Hx    Pancreatic cancer Neg Hx     SOCIAL HISTORY:   Social History   Tobacco Use   Smoking status: Every Day    Current packs/day: 0.75    Average packs/day: 0.8 packs/day for 55.0 years (41.3 ttl pk-yrs)    Types: Cigarettes   Smokeless tobacco: Never   Tobacco comments:    Smokes 8-9 cigarettes per day  Substance Use Topics   Alcohol use: Not Currently     ALLERGIES:   Allergies  Allergen Reactions   Candesartan Cilexetil-Hctz Other (See Comments)    Acute renal failure   Hydrochlorothiazide Other (See Comments)    Acute renal failure   Shellfish Allergy Other (See Comments)    Gout occurs   Betadine [Povidone Iodine ] Itching     CURRENT MEDICATIONS:   Current Outpatient Medications  Medication Sig Dispense Refill   albuterol  (VENTOLIN  HFA) 108 (90 Base) MCG/ACT inhaler  INHALE 1-2 PUFFS BY MOUTH EVERY 6 HOURS AS NEEDED FOR WHEEZE OR SHORTNESS OF BREATH 8.5 each 2   amLODipine  (NORVASC ) 10 MG tablet Take 1 tablet (10 mg total) by mouth daily. 90 tablet 3   aspirin  EC 81 MG tablet Take 1 tablet (81 mg total) by mouth daily. Swallow whole. (Patient taking differently: Take 81 mg by mouth at bedtime. Swallow whole.) 30 tablet 12   clopidogrel  (PLAVIX ) 75 MG tablet TAKE 1 TABLET BY MOUTH EVERY DAY 90 tablet 1   diazepam  (VALIUM ) 5 MG tablet Take 0.5 tablets (2.5 mg total) by mouth every 12 (twelve) hours as needed for muscle spasms. 5 tablet 0   escitalopram  (LEXAPRO ) 5 MG tablet TAKE 1 TABLET (5 MG TOTAL) BY MOUTH DAILY. 90 tablet 1   HYDROcodone -acetaminophen  (NORCO) 7.5-325 MG tablet Take 1 tablet by mouth 2 (two) times daily as needed for moderate pain (pain score 4-6). 60 tablet 0   Lido-Capsaicin-Men-Methyl Sal (1ST MEDX-PATCH/ LIDOCAINE ) 4-0.025-5-20 % PTCH Apply 1 patch topically daily as needed for up to 10 days (pain). 10 patch 0   lidocaine  (LIDODERM ) 5 % Place 1 patch onto the skin daily. Remove & Discard patch within 12 hours or as directed by MD 5 patch 0   metoprolol  succinate (TOPROL -XL) 25 MG 24 hr tablet Take 1 tablet (25 mg total) by mouth daily. 90 tablet 0   mirtazapine  (REMERON ) 7.5 MG tablet TAKE 2 TABLETS (15 MG TOTAL) BY MOUTH AT BEDTIME. 180 tablet 3   Pancrelipase , Lip-Prot-Amyl, (ZENPEP ) 20000-63000 units CPEP Take 3 capsules by mouth 3 (three) times daily before meals. 270 capsule 0   pantoprazole  (PROTONIX ) 40 MG tablet TAKE 1 TABLET EVERY DAY FOR REFLUX 90 tablet 3   rosuvastatin  (CRESTOR ) 40 MG tablet Take 1 tablet (40 mg total) by mouth daily. 90 tablet 3   sacubitril -valsartan  (ENTRESTO ) 97-103 MG Take 1 tablet by mouth 2 (two) times daily. 60 tablet 3   senna (SENOKOT) 8.6 MG TABS tablet Take 2 tablets (17.2 mg total) by mouth in the morning and at bedtime. (Patient taking differently: Take 1 tablet by mouth in the morning and at  bedtime.) 120 tablet 1   Tiotropium Bromide Monohydrate  (SPIRIVA  RESPIMAT) 2.5 MCG/ACT AERS Inhale 2 puffs into the lungs daily. 4 g 2   No current facility-administered medications for this visit.    REVIEW OF SYSTEMS:   [X]  denotes positive finding, [ ]   denotes negative finding Cardiac  Comments:  Chest pain or chest pressure:    Shortness of breath upon exertion:    Short of breath when lying flat:    Irregular heart rhythm:        Vascular    Pain in calf, thigh, or hip brought on by ambulation:    Pain in feet at night that wakes you up from your sleep:     Blood clot in your veins:    Leg swelling:         Pulmonary    Oxygen at home:    Productive cough:     Wheezing:         Neurologic    Sudden weakness in arms or legs:     Sudden numbness in arms or legs:     Sudden onset of difficulty speaking or slurred speech:    Temporary loss of vision in one eye:     Problems with dizziness:         Gastrointestinal    Blood in stool:     Vomited blood:         Genitourinary    Burning when urinating:     Blood in urine:        Psychiatric    Major depression:         Hematologic    Bleeding problems:    Problems with blood clotting too easily:        Skin    Rashes or ulcers:        Constitutional    Fever or chills:      PHYSICAL EXAM:   Vitals:   11/03/23 1058  BP: 132/76  Pulse: 70  Resp: 20  Temp: 98.6 F (37 C)  TempSrc: Temporal  SpO2: 93%  Weight: 138 lb 14.4 oz (63 kg)  Height: 6' (1.829 m)    GENERAL: The patient is a well-nourished male, in no acute distress. The vital signs are documented above. CARDIAC: There is a regular rate  PULMONARY: Non-labored respirations ABDOMEN: Soft and non-tender .  MUSCULOSKELETAL: There are no major deformities or cyanosis. NEUROLOGIC: No focal weakness or paresthesias are detected. SKIN: There are no ulcers or rashes noted. PSYCHIATRIC: The patient has a normal affect.  STUDIES:   I have  reviewed the CT scan as follows: 1. Postsurgical changes from fenestrated stent graft placement in the abdominal aorta with bilateral renal fenestrations. The infrarenal component of the aneurysm is decreased in size currently measuring 5.4 cm, previously 7.5 cm. 2. There is an endo leak at the distal aspect of the left main renal stent graft landing zone with contrast accumulation within the aneurysm sac at this site. The aneurysm sac at this site is increasing in size measuring approximately 5.5 cm, previously 4.9 cm. 3. There is also a short segment of contrast accumulation external to the stent graft struts in the proximal position as annotated and detailed above.    MEDICAL ISSUES:   Juxtarenal aneurysm: The patient has previously undergone endovascular repair using a ZFEN the.  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  I think the only way to seal this is to extend more proximal which does mean a redo Z thin.  I discussed the details of the surgery with the patient.  I talked about the higher risk for renal insufficiency and renal complications given his existing renal stents.  He understands  all these risks and wishes to proceed.  We will go ahead and measure his device and get him ordered so that he can have this done in the next several weeks.    Marti Slates, MD, FACS [facility] [phone] Pager [phone]

## 2023-11-03 NOTE — H&P (View-Only) (Signed)
 [facility]in Specialist of   Patient name: [name] MRN: [id number] DOB: Dec 16, 1945 Sex: male   REASON FOR VISIT:    Follow-up  HISOTRY OF PRESENT ILLNESS:    [name] is a 78 y.o. male who underwent a fenestrated juxtarenal abdominal aortic aneurysm repair with stenting of his left and right renal arteries on 08/16/2022.  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  His postoperative CT scan shows that his graft is in good position with no evidence of endoleak.  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  I originally scheduled him for angiography but after reviewing this with several colleagues, I felt that this could not be treated with stenting of his SMA but rather a redo Z fashion and so I brought him back today to further discuss this.     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  He has a suprapubic catheter in place.  He suffers from protein calorie malnutrition as well as weight loss.  He has a history of coronary artery disease, status post PCI.   PAST MEDICAL HISTORY:   Past Medical History:  Diagnosis Date   AAA (abdominal aortic aneurysm) (HCC)    followed by vasc surgery   Alcohol abuse    Barretts esophagus    bx distal esophagus 2011 neg    Bladder outlet obstruction 04/02/2018   CAD in native artery    a. s/p stent to prox RCA and mid Cx.   Cataract    Cholelithiasis    Chronic osteomyelitis involving ankle and foot, right (HCC) 07/13/2018   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   2006 - 3 adenomas largest 12 mm  2008 - no polyps 2013-  2 mm cecal polyp, adenoma, routine repeat colonoscopy 02/2017 approx 05/14/2017 18 polyps max 12 mm ALL adenomas recall 2019 Kenney Peacemaker, MD, [facility]      Dieulafoy lesion of duodenum    Diverticulosis of colon    DJD (degenerative  joint disease)    left hip   Full dentures    upper and lower   GERD (gastroesophageal reflux disease)    diet controlled    Gout    feet   Hiatal hernia    2 cm noted on upper endos 2011    Hx of adenomatous colonic polyps    Hyperlipidemia    Hypertension    Internal hemorrhoid    Lung cancer (HCC)    squamous cell (T2N0MX), 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 pt was on Plavix  x 3 years then transitioned to ASA, no problems since   Non-small cell carcinoma of right lung, stage 1 (HCC) 01/30/2016   Renal failure, acute (HCC)    hx of 2/2 medication, resolved   Tobacco abuse    smoker .5 ppd x 55 yrs   UTI (lower urinary tract infection)      FAMILY HISTORY:   Family History  Problem Relation Age of Onset   Diabetes Mother    Hypertension Sister    Heart disease Sister    Diabetes Sister    Arthritis Brother    Gout Brother    Colon cancer Neg Hx    Esophageal cancer Neg Hx    Rectal cancer Neg Hx  Stomach cancer Neg Hx    Colon polyps Neg Hx    Inflammatory bowel disease Neg Hx    Liver disease Neg Hx    Pancreatic cancer Neg Hx     SOCIAL HISTORY:   Social History   Tobacco Use   Smoking status: Every Day    Current packs/day: 0.75    Average packs/day: 0.8 packs/day for 55.0 years (41.3 ttl pk-yrs)    Types: Cigarettes   Smokeless tobacco: Never   Tobacco comments:    Smokes 8-9 cigarettes per day  Substance Use Topics   Alcohol use: Not Currently     ALLERGIES:   Allergies  Allergen Reactions   Candesartan Cilexetil-Hctz Other (See Comments)    Acute renal failure   Hydrochlorothiazide Other (See Comments)    Acute renal failure   Shellfish Allergy Other (See Comments)    Gout occurs   Betadine [Povidone Iodine ] Itching     CURRENT MEDICATIONS:   Current Outpatient Medications  Medication Sig Dispense Refill   albuterol  (VENTOLIN  HFA) 108 (90 Base) MCG/ACT inhaler  INHALE 1-2 PUFFS BY MOUTH EVERY 6 HOURS AS NEEDED FOR WHEEZE OR SHORTNESS OF BREATH 8.5 each 2   amLODipine  (NORVASC ) 10 MG tablet Take 1 tablet (10 mg total) by mouth daily. 90 tablet 3   aspirin  EC 81 MG tablet Take 1 tablet (81 mg total) by mouth daily. Swallow whole. (Patient taking differently: Take 81 mg by mouth at bedtime. Swallow whole.) 30 tablet 12   clopidogrel  (PLAVIX ) 75 MG tablet TAKE 1 TABLET BY MOUTH EVERY DAY 90 tablet 1   diazepam  (VALIUM ) 5 MG tablet Take 0.5 tablets (2.5 mg total) by mouth every 12 (twelve) hours as needed for muscle spasms. 5 tablet 0   escitalopram  (LEXAPRO ) 5 MG tablet TAKE 1 TABLET (5 MG TOTAL) BY MOUTH DAILY. 90 tablet 1   HYDROcodone -acetaminophen  (NORCO) 7.5-325 MG tablet Take 1 tablet by mouth 2 (two) times daily as needed for moderate pain (pain score 4-6). 60 tablet 0   Lido-Capsaicin-Men-Methyl Sal (1ST MEDX-PATCH/ LIDOCAINE ) 4-0.025-5-20 % PTCH Apply 1 patch topically daily as needed for up to 10 days (pain). 10 patch 0   lidocaine  (LIDODERM ) 5 % Place 1 patch onto the skin daily. Remove & Discard patch within 12 hours or as directed by MD 5 patch 0   metoprolol  succinate (TOPROL -XL) 25 MG 24 hr tablet Take 1 tablet (25 mg total) by mouth daily. 90 tablet 0   mirtazapine  (REMERON ) 7.5 MG tablet TAKE 2 TABLETS (15 MG TOTAL) BY MOUTH AT BEDTIME. 180 tablet 3   Pancrelipase , Lip-Prot-Amyl, (ZENPEP ) 20000-63000 units CPEP Take 3 capsules by mouth 3 (three) times daily before meals. 270 capsule 0   pantoprazole  (PROTONIX ) 40 MG tablet TAKE 1 TABLET EVERY DAY FOR REFLUX 90 tablet 3   rosuvastatin  (CRESTOR ) 40 MG tablet Take 1 tablet (40 mg total) by mouth daily. 90 tablet 3   sacubitril -valsartan  (ENTRESTO ) 97-103 MG Take 1 tablet by mouth 2 (two) times daily. 60 tablet 3   senna (SENOKOT) 8.6 MG TABS tablet Take 2 tablets (17.2 mg total) by mouth in the morning and at bedtime. (Patient taking differently: Take 1 tablet by mouth in the morning and at  bedtime.) 120 tablet 1   Tiotropium Bromide Monohydrate  (SPIRIVA  RESPIMAT) 2.5 MCG/ACT AERS Inhale 2 puffs into the lungs daily. 4 g 2   No current facility-administered medications for this visit.    REVIEW OF SYSTEMS:   [X]  denotes positive finding, [ ]   denotes negative finding Cardiac  Comments:  Chest pain or chest pressure:    Shortness of breath upon exertion:    Short of breath when lying flat:    Irregular heart rhythm:        Vascular    Pain in calf, thigh, or hip brought on by ambulation:    Pain in feet at night that wakes you up from your sleep:     Blood clot in your veins:    Leg swelling:         Pulmonary    Oxygen at home:    Productive cough:     Wheezing:         Neurologic    Sudden weakness in arms or legs:     Sudden numbness in arms or legs:     Sudden onset of difficulty speaking or slurred speech:    Temporary loss of vision in one eye:     Problems with dizziness:         Gastrointestinal    Blood in stool:     Vomited blood:         Genitourinary    Burning when urinating:     Blood in urine:        Psychiatric    Major depression:         Hematologic    Bleeding problems:    Problems with blood clotting too easily:        Skin    Rashes or ulcers:        Constitutional    Fever or chills:      PHYSICAL EXAM:   Vitals:   11/03/23 1058  BP: 132/76  Pulse: 70  Resp: 20  Temp: 98.6 F (37 C)  TempSrc: Temporal  SpO2: 93%  Weight: 138 lb 14.4 oz (63 kg)  Height: 6' (1.829 m)    GENERAL: The patient is a well-nourished male, in no acute distress. The vital signs are documented above. CARDIAC: There is a regular rate  PULMONARY: Non-labored respirations ABDOMEN: Soft and non-tender .  MUSCULOSKELETAL: There are no major deformities or cyanosis. NEUROLOGIC: No focal weakness or paresthesias are detected. SKIN: There are no ulcers or rashes noted. PSYCHIATRIC: The patient has a normal affect.  STUDIES:   I have  reviewed the CT scan as follows: 1. Postsurgical changes from fenestrated stent graft placement in the abdominal aorta with bilateral renal fenestrations. The infrarenal component of the aneurysm is decreased in size currently measuring 5.4 cm, previously 7.5 cm. 2. There is an endo leak at the distal aspect of the left main renal stent graft landing zone with contrast accumulation within the aneurysm sac at this site. The aneurysm sac at this site is increasing in size measuring approximately 5.5 cm, previously 4.9 cm. 3. There is also a short segment of contrast accumulation external to the stent graft struts in the proximal position as annotated and detailed above.    MEDICAL ISSUES:   Juxtarenal aneurysm: The patient has previously undergone endovascular repair using a ZFEN the.  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  I think the only way to seal this is to extend more proximal which does mean a redo Z thin.  I discussed the details of the surgery with the patient.  I talked about the higher risk for renal insufficiency and renal complications given his existing renal stents.  He understands  all these risks and wishes to proceed.  We will go ahead and measure his device and get him ordered so that he can have this done in the next several weeks.    Marti Slates, MD, FACS [facility] [phone] Pager [phone]

## 2023-11-04 ENCOUNTER — Telehealth: Payer: Self-pay

## 2023-11-04 ENCOUNTER — Other Ambulatory Visit: Payer: Self-pay | Admitting: Internal Medicine

## 2023-11-04 MED ORDER — MIRTAZAPINE 7.5 MG PO TABS: 15.0000 mg | ORAL_TABLET | Freq: Every day | ORAL | 0 refills | Status: DC

## 2023-11-04 NOTE — Telephone Encounter (Unsigned)
 Copied from CRM [id number]. Topic: Clinical - Medication Refill >> Nov 04, 2023  1:11 PM Brynn Caras wrote: Medication: mirtazapine  (REMERON ) 7.5 MG tablet  Has the patient contacted their pharmacy? Yes (Agent: If no, request that the patient contact the pharmacy for the refill. If patient does not wish to contact the pharmacy document the reason why and proceed with request.) (Agent: If yes, when and what did the pharmacy advise?)  This is the patient's preferred pharmacy:  [id number] - [place], [place] - [address] AT [facility] [address] [place] [address] Phone: [phone] Fax: [phone]  Is this the correct pharmacy for this prescription? Yes If no, delete pharmacy and type the correct one.   Has the prescription been filled recently? No  Is the patient out of the medication? Yes  Has the patient been seen for an appointment in the last year OR does the patient have an upcoming appointment? Yes  Can we respond through MyChart? No  Agent: Please be advised that Rx refills may take up to 3 business days. We ask that you follow-up with your pharmacy.

## 2023-11-04 NOTE — Telephone Encounter (Signed)
 Attempted to call for surgery scheduling. No VM

## 2023-11-04 NOTE — Telephone Encounter (Signed)
 Attempted to call for surgery scheduling. LVM

## 2023-11-05 ENCOUNTER — Telehealth: Payer: Self-pay

## 2023-11-05 NOTE — Telephone Encounter (Signed)
 Left a message with patient's sister, Darrol Emmer.  Requesting call back from patients.

## 2023-11-05 NOTE — Telephone Encounter (Signed)
 Attempted to call for surgery scheduling. No answer / No VM.

## 2023-11-07 NOTE — Telephone Encounter (Unsigned)
 Copied from CRM [id number]. Topic: Clinical - Medication Refill >> Nov 07, 2023  3:09 PM Retta Caster wrote: Medication: Pancrelipase , Lip-Prot-Amyl, (ZENPEP ) 20000-63000 units CPEP HYDROcodone -acetaminophen  (NORCO) 7.5-325 MG tablet   Has the patient contacted their pharmacy? Yes (Agent: If no, request that the patient contact the pharmacy for the refill. If patient does not wish to contact the pharmacy document the reason why and proceed with request.) (Agent: If yes, when and what did the pharmacy advise?)  This is the patient's preferred pharmacy:  [id number] - [place], [place] - [address] AT [facility] [address] [place] [address] Phone: [phone] Fax: [phone]   Is this the correct pharmacy for this prescription? Yes If no, delete pharmacy and type the correct one.   Has the prescription been filled recently? Yes  Is the patient out of the medication? No  Has the patient been seen for an appointment in the last year OR does the patient have an upcoming appointment? Yes  Can we respond through MyChart? Yes  Agent: Please be advised that Rx refills may take up to 3 business days. We ask that you follow-up with your pharmacy.

## 2023-11-08 IMAGING — CR DG CHEST 2V
2 series · 2 of 2 positions shown · non-contrast
Comparison: 11/06/2020.

CLINICAL DATA: Fatigue. No appetite for 5 days. Possible pneumonia.

EXAM:
CHEST - 2 VIEW

[chest lat]
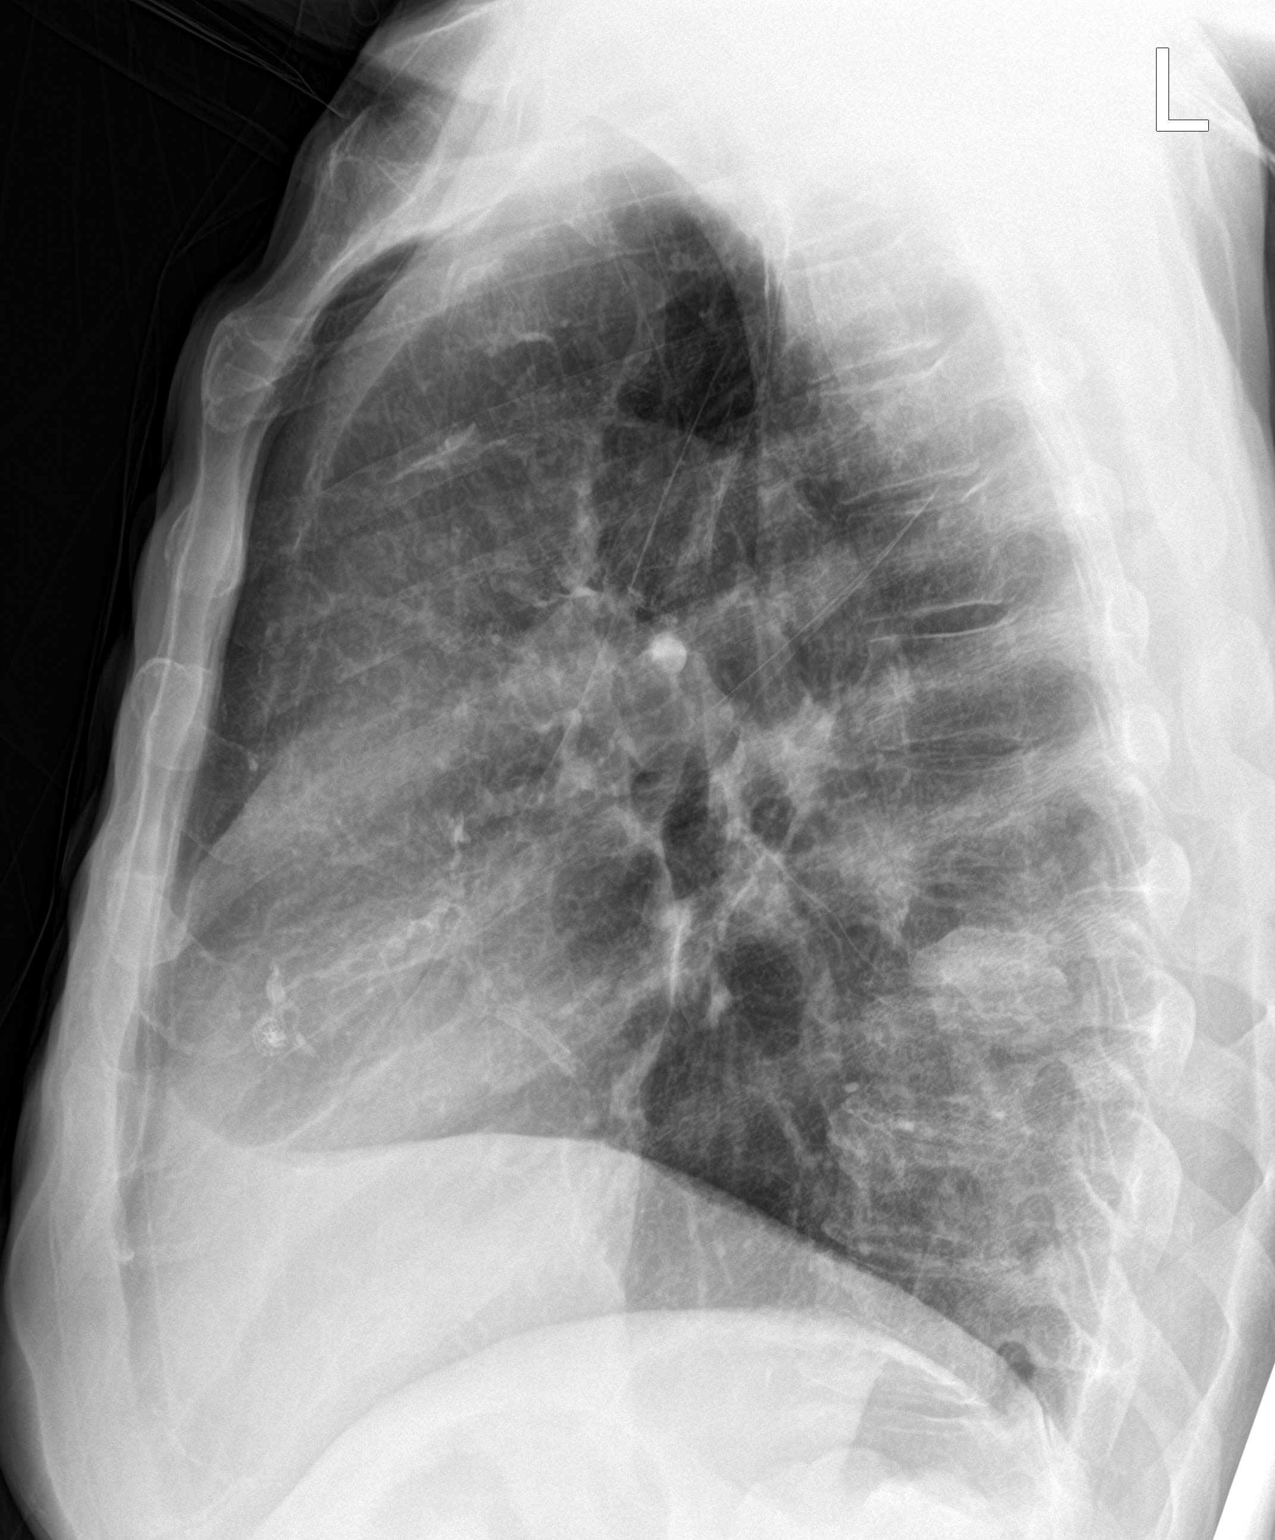

[chest ap]
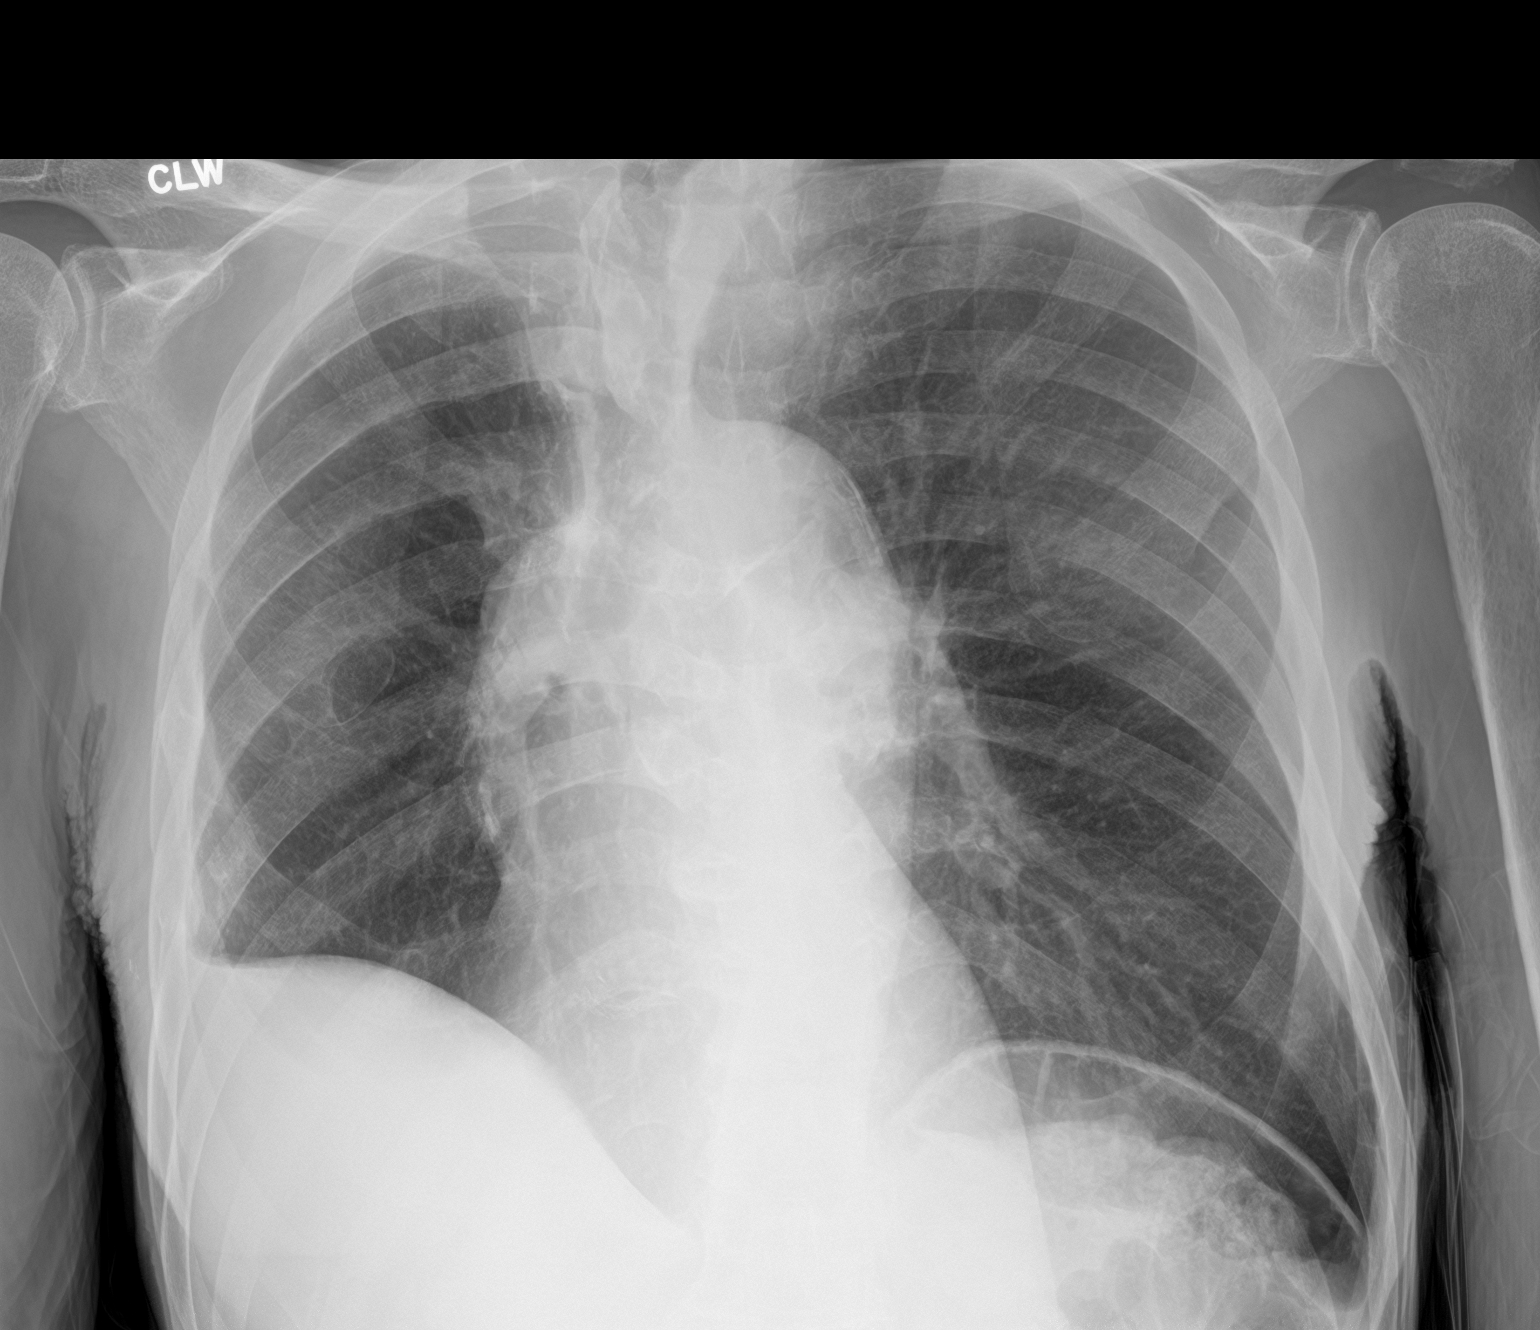

[2 of 2 positions shown; findings below may reference images not displayed]

FINDINGS: Cardiac silhouette is normal in size. Prominent aortic arch. No
mediastinal or hilar masses or evidence of adenopathy.

Mild elevation of the right hemidiaphragm, stable.

Clear lungs.  No pleural effusion or pneumothorax.

Skeletal structures are intact.
IMPRESSION: No active cardiopulmonary disease.

## 2023-11-09 IMAGING — CT CT CTA ABD/PEL W/CM AND/OR W/O CM
2 of 7 series · 14 of 46 positions shown, 16 images · IV contrast (APPLIED)
Comparison: 03/06/2021

CLINICAL DATA: Aortic aneurysm, surveillance

EXAM:
CT ANGIOGRAPHY ABDOMEN AND PELVIS WITH CONTRAST AND WITHOUT CONTRAST
TECHNIQUE: Multidetector CT imaging of the abdomen and pelvis was performed
using the standard protocol during bolus administration of
intravenous contrast. Multiplanar reconstructed images and MIPs were
obtained and reviewed to evaluate the vascular anatomy.
CONTRAST:  75mL OMNIPAQUE IOHEXOL 350 MG/ML SOLN

[Series 4: arterial · axial · arterial · 0.63mm/px · z∈[+840,+1280]mm · 11 of 244 slices shown, 13 images]
[im 12/244  soft-tissue]
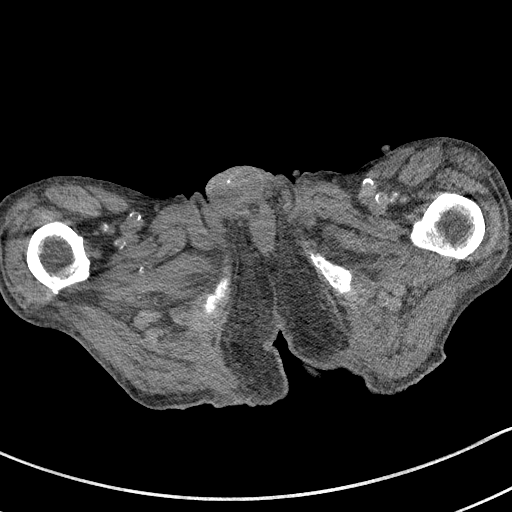
[im 12/244  bone]
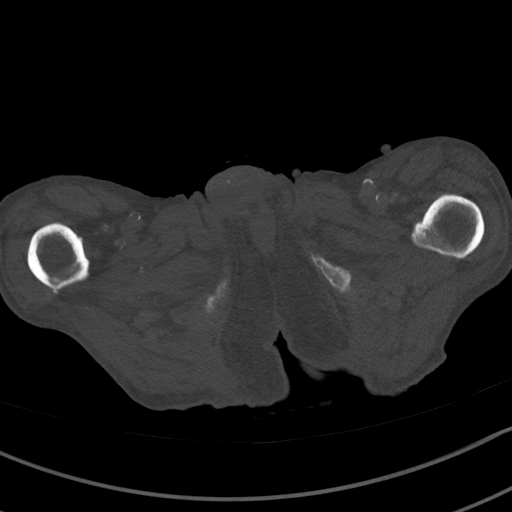
[im 34/244  soft-tissue]
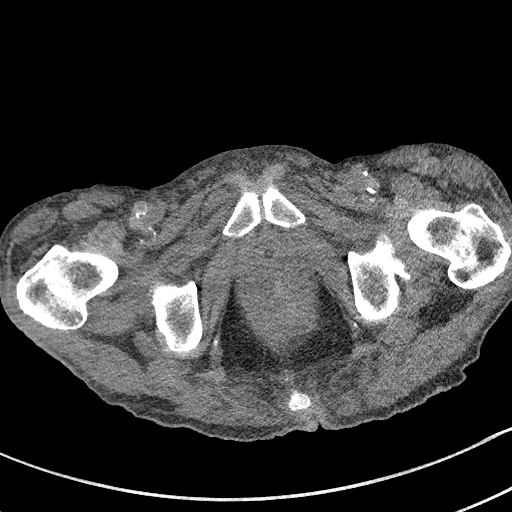
[im 56/244  soft-tissue]
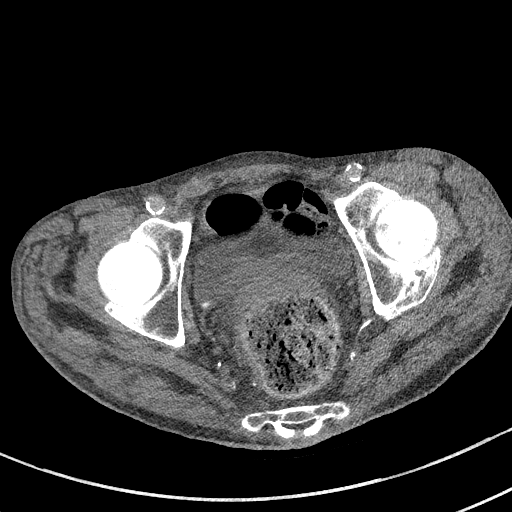
[im 78/244  soft-tissue]
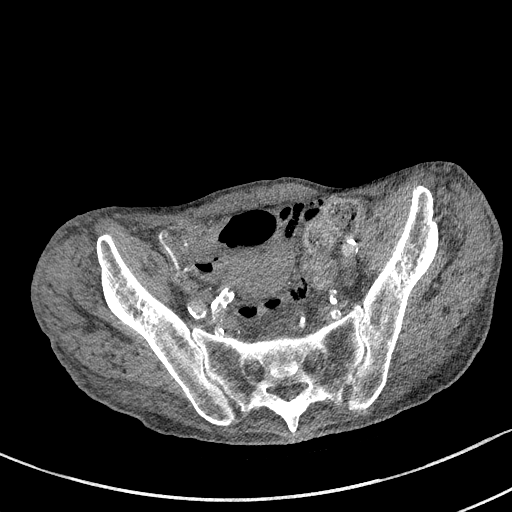
[im 100/244  soft-tissue]
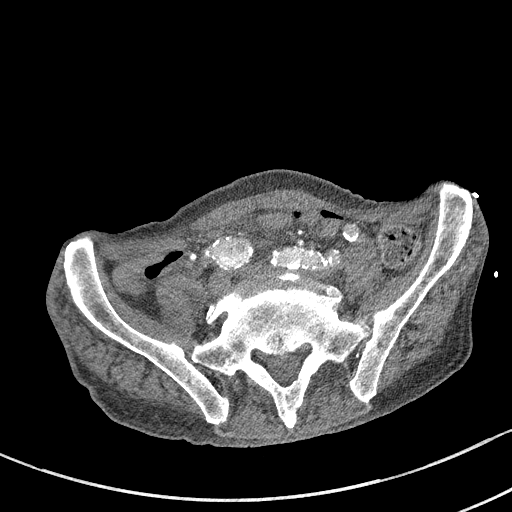
[im 122/244  soft-tissue]
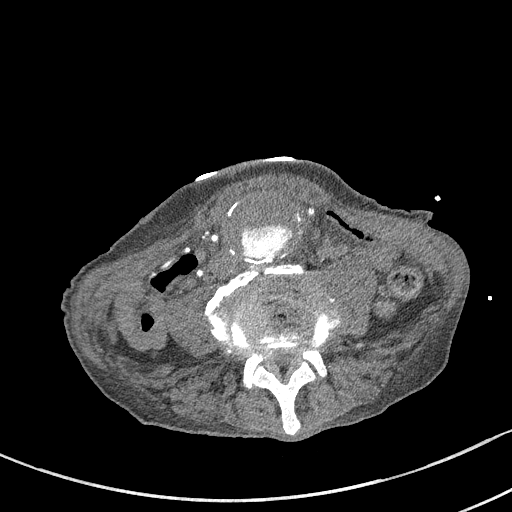
[im 144/244  soft-tissue]
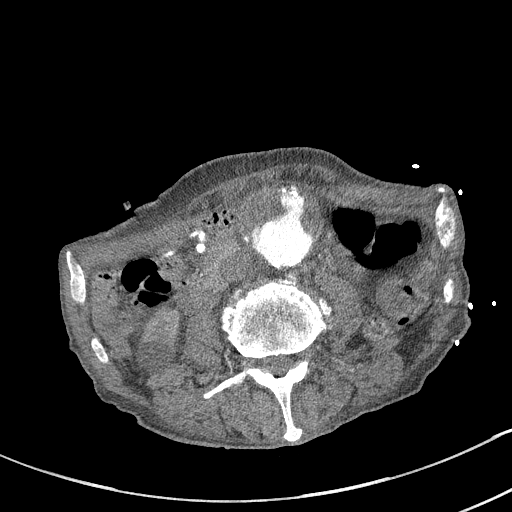
[im 166/244  soft-tissue]
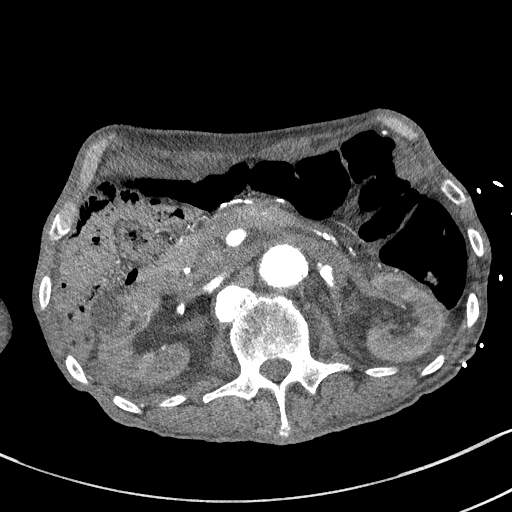
[im 188/244  soft-tissue]
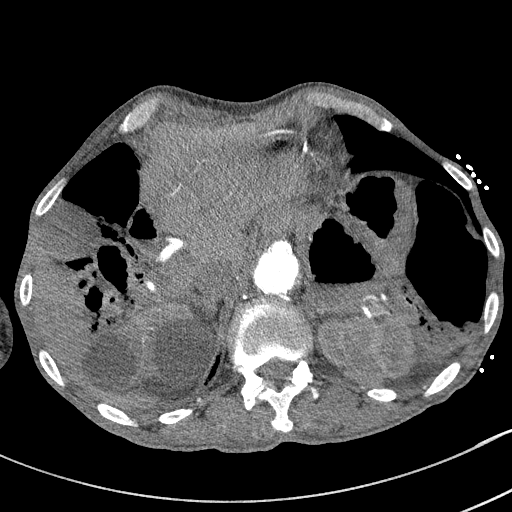
[im 188/244  bone]
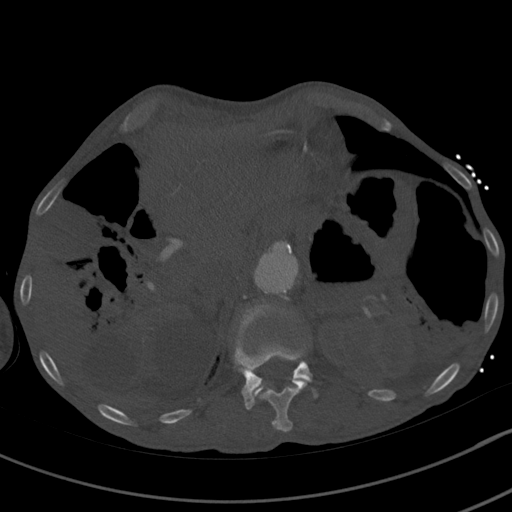
[im 210/244  soft-tissue]
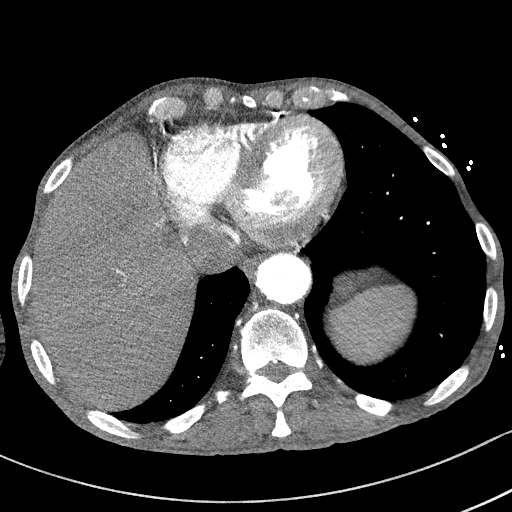
[im 232/244  soft-tissue]
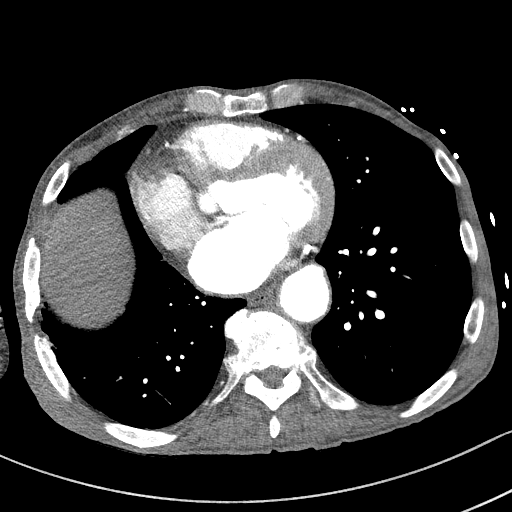

[Series 7: cor arterial · coronal · arterial · 0.67mm/px · 3 of 121 slices shown]
[im 31/121  soft-tissue]
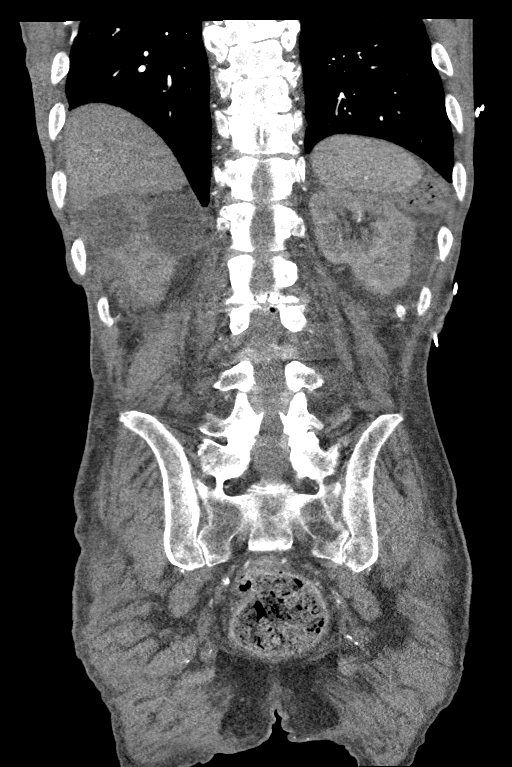
[im 61/121  soft-tissue]
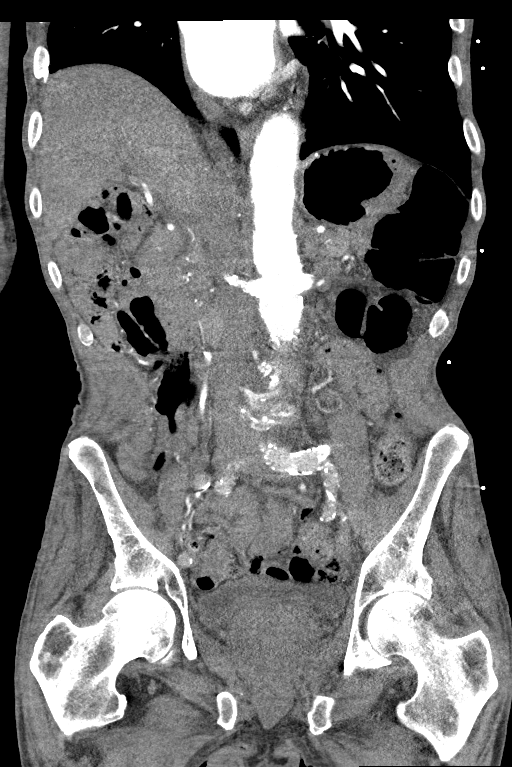
[im 91/121  soft-tissue]
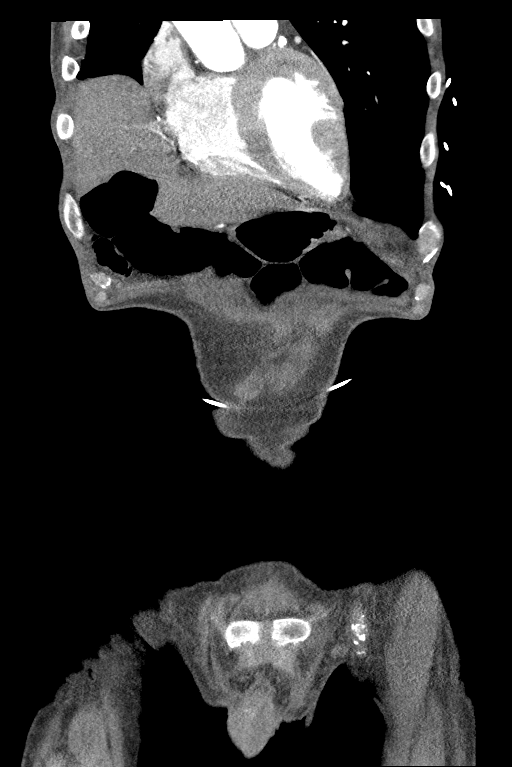

[14 of 46 positions shown; findings below may reference images not displayed]

FINDINGS: VASCULAR

Coronary calcifications.

Aorta: Ectatic visualized distal descending thoracic segment up to
3.2 cm diameter, suprarenal segment ectatic up to 3.3 cm diameter.
Fusiform infrarenal Aortic aneurysm ([id number].Z) 6.1 x 5.5 cm
maximum transverse dimensions (previously 5.9 x 5.4 by my
measurement).

Celiac: Patent without evidence of aneurysm, dissection, vasculitis
or significant stenosis.

SMA: Calcified ostial plaque without significant stenosis, ectatic
and widely patent distally with classic distal branch anatomy.

Renals: Duplicated left, superior dominant, atheromatous but patent.
Single right, atheromatous but patent.

IMA: Patent, arising from the aneurysmal segment of the aorta

Inflow: 2.1 cm fusiform dilatation of the right common iliac
aneurysm, 1.5 cm left fusiform common iliac aneurysm. Ectatic mildly
tortuous external and internal iliac arteries with moderate
scattered calcified plaque, incompletely opacified.

Proximal Outflow: Atheromatous, without definite stenosis,
evaluation limited due to poor contrast enhancement

Veins: No obvious venous abnormality within the limitations of this
arterial phase study.

Review of the MIP images confirms the above findings.

NON-VASCULAR

Lower chest: No pleural or pericardial effusion. Emphysema
([id number]).

Hepatobiliary: 1.6 cm partially calcified stone in the gallbladder
neck. Smaller layering hyperdense stones in the gallbladder fundus.
No hepatic lesion or significant biliary ductal dilatation.

Pancreas: Unremarkable. No pancreatic ductal dilatation or
surrounding inflammatory changes.

Spleen: Normal in size without focal abnormality.

Adrenals/Urinary Tract: Adrenal glands unremarkable. Stable
bilateral renal cysts. No hydronephrosis. Foley catheter
decompresses urinary bladder.

Stomach/Bowel: The stomach is incompletely distended, unremarkable.
Small bowel decompressed. The colon is nondilated. Fecal distention
of the rectum.

Lymphatic: No abdominal or pelvic adenopathy.

Reproductive: Mild prostate enlargement.

Other: No ascites.  No free air.

Musculoskeletal: Multilevel lumbar spondylitic change. Left hip DJD
possibly posttraumatic. No acute fracture or worrisome bone lesion.
IMPRESSION: 1. 6.1 cm fusiform infrarenal abdominal aortic aneurysm, previously
5.9. Recommend referral to a vascular specialist. This
recommendation follows ACR consensus guidelines: White Paper of the
ACR Incidental Findings Committee II on Vascular Findings. [HOSPITAL] 8647; [DATE].
2. 2.1 cm right and 1.5 cm left common iliac aneurysms.
3. Cholelithiasis

## 2023-11-10 ENCOUNTER — Telehealth: Payer: Self-pay | Admitting: Student

## 2023-11-10 DIAGNOSIS — M25551 Pain in right hip: Secondary | ICD-10-CM

## 2023-11-10 DIAGNOSIS — Z79891 Long term (current) use of opiate analgesic: Secondary | ICD-10-CM

## 2023-11-10 MED ORDER — ZENPEP 20000-63000 UNITS PO CPEP: 3.0000 | ORAL_CAPSULE | Freq: Three times a day (TID) | ORAL | 0 refills | Status: DC

## 2023-11-10 MED ORDER — HYDROCODONE-ACETAMINOPHEN 7.5-325 MG PO TABS: 1.0000 | ORAL_TABLET | Freq: Two times a day (BID) | ORAL | 0 refills | Status: DC | PRN

## 2023-11-10 NOTE — Telephone Encounter (Signed)
 Received after hours page. Returned call to patient and confirmed DOB. Patient states need for refill request for Norco 7.5-325 mg BID PRN and pancrelipase  3 capsules TID AC. Reviewed PDMP and appropriate with last fill date of Norco on 10/07/23. Sent Rx refills for Norco 7.5-325 mg BID PRN (60 tablets, 0 refills) and Pancrelipase  to [facility]. Patient verbalizes understanding of plan and all questions addressed.

## 2023-11-13 ENCOUNTER — Other Ambulatory Visit: Payer: Self-pay

## 2023-11-13 DIAGNOSIS — I7142 Juxtarenal abdominal aortic aneurysm, without rupture: Secondary | ICD-10-CM

## 2023-11-18 ENCOUNTER — Telehealth: Payer: Self-pay

## 2023-11-18 NOTE — Telephone Encounter (Signed)
 This nurse called patient to inform of surgery date change from 6/10 to 6/17.  Patient acknowledged.

## 2023-11-24 DIAGNOSIS — R338 Other retention of urine: Secondary | ICD-10-CM | POA: Diagnosis not present

## 2023-11-26 ENCOUNTER — Ambulatory Visit

## 2023-11-26 VITALS — Ht 72.0 in | Wt 135.0 lb

## 2023-11-26 DIAGNOSIS — Z0000 Encounter for general adult medical examination without abnormal findings: Secondary | ICD-10-CM

## 2023-11-26 NOTE — Progress Notes (Addendum)
 Because this visit was a virtual/telehealth visit,  certain criteria was not obtained, such a blood pressure, CBG if applicable, and timed get up and go. Any medications not marked as taking were not mentioned during the medication reconciliation part of the visit. Any vitals not documented were not able to be obtained due to this being a telehealth visit or patient was unable to self-report a recent blood pressure reading due to a lack of equipment at home via telehealth. Vitals that have been documented are verbally provided by the patient.   Subjective:   [name] is a 78 y.o. who presents for a Medicare Wellness preventive visit.  As a reminder, Annual Wellness Visits don't include a physical exam, and some assessments may be limited, especially if this visit is performed virtually. We may recommend an in-person follow-up visit with your provider if needed.  Visit Complete: Virtual I connected with  Feliciano Horner on 11/26/23 by a audio enabled telemedicine application and verified that I am speaking with the correct person using two identifiers.  Patient Location: Home  Provider Location: Office/Clinic  I discussed the limitations of evaluation and management by telemedicine. The patient expressed understanding and agreed to proceed.  Vital Signs: Because this visit was a virtual/telehealth visit, some criteria may be missing or patient reported. Any vitals not documented were not able to be obtained and vitals that have been documented are patient reported.  VideoDeclined- This patient declined Librarian, academic. Therefore the visit was completed with audio only.  Persons Participating in Visit: Patient.  AWV Questionnaire: No: Patient Medicare AWV questionnaire was not completed prior to this visit.  Cardiac Risk Factors include: advanced age (>15men, >71 women);dyslipidemia;family history of premature cardiovascular disease;hypertension;male  gender;smoking/ tobacco exposure;sedentary lifestyle     Objective:     Today's Vitals   11/26/23 1031  Weight: 135 lb (61.2 kg)  Height: 6' (1.829 m)  PainSc: 0-No pain   Body mass index is 18.31 kg/m.     11/26/2023   10:52 AM 11/01/2023   12:21 AM 10/29/2023    7:19 PM 06/01/2023    1:51 AM 04/23/2023   11:59 PM 01/27/2023   10:53 AM 09/23/2022    9:40 AM  Advanced Directives  Does Patient Have a Medical Advance Directive? No No No No No No No  Would patient like information on creating a medical advance directive? No - Patient declined   No - Patient declined  No - Patient declined No - Patient declined    Current Medications (verified) Outpatient Encounter Medications as of 11/26/2023  Medication Sig   albuterol  (VENTOLIN  HFA) 108 (90 Base) MCG/ACT inhaler INHALE 1-2 PUFFS BY MOUTH EVERY 6 HOURS AS NEEDED FOR WHEEZE OR SHORTNESS OF BREATH   amLODipine  (NORVASC ) 10 MG tablet Take 1 tablet (10 mg total) by mouth daily.   aspirin  EC 81 MG tablet Take 1 tablet (81 mg total) by mouth daily. Swallow whole. (Patient taking differently: Take 81 mg by mouth at bedtime. Swallow whole.)   clopidogrel  (PLAVIX ) 75 MG tablet TAKE 1 TABLET BY MOUTH EVERY DAY   diazepam  (VALIUM ) 5 MG tablet Take 0.5 tablets (2.5 mg total) by mouth every 12 (twelve) hours as needed for muscle spasms.   escitalopram  (LEXAPRO ) 5 MG tablet TAKE 1 TABLET (5 MG TOTAL) BY MOUTH DAILY.   HYDROcodone -acetaminophen  (NORCO) 7.5-325 MG tablet Take 1 tablet by mouth 2 (two) times daily as needed for moderate pain (pain score 4-6). Fill 30  days after last fill.   lidocaine  (LIDODERM ) 5 % Place 1 patch onto the skin daily. Remove & Discard patch within 12 hours or as directed by MD   metoprolol  succinate (TOPROL -XL) 25 MG 24 hr tablet Take 1 tablet (25 mg total) by mouth daily.   mirtazapine  (REMERON ) 7.5 MG tablet Take 2 tablets (15 mg total) by mouth at bedtime.   Pancrelipase , Lip-Prot-Amyl, (ZENPEP ) 20000-63000 units  CPEP Take 3 capsules by mouth 3 (three) times daily before meals.   pantoprazole  (PROTONIX ) 40 MG tablet TAKE 1 TABLET EVERY DAY FOR REFLUX   rosuvastatin  (CRESTOR ) 40 MG tablet Take 1 tablet (40 mg total) by mouth daily.   sacubitril -valsartan  (ENTRESTO ) 97-103 MG Take 1 tablet by mouth 2 (two) times daily.   senna (SENOKOT) 8.6 MG TABS tablet Take 2 tablets (17.2 mg total) by mouth in the morning and at bedtime. (Patient taking differently: Take 1 tablet by mouth in the morning and at bedtime.)   Tiotropium Bromide Monohydrate  (SPIRIVA  RESPIMAT) 2.5 MCG/ACT AERS Inhale 2 puffs into the lungs daily.   No facility-administered encounter medications on file as of 11/26/2023.    Allergies (verified) Candesartan cilexetil-hctz, Hydrochlorothiazide, Shellfish allergy, and Betadine [povidone iodine ]   History: Past Medical History:  Diagnosis Date   AAA (abdominal aortic aneurysm) (HCC)    followed by vasc surgery   Alcohol abuse    Barretts esophagus    bx distal esophagus 2011 neg    Bladder outlet obstruction 04/02/2018   CAD in native artery    a. s/p stent to prox RCA and mid Cx.   Cataract    Cholelithiasis    Chronic osteomyelitis involving ankle and foot, right (HCC) 07/13/2018   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   2006 - 3 adenomas largest 12 mm  2008 - no polyps 2013-  2 mm cecal polyp, adenoma, routine repeat colonoscopy 02/2017 approx 05/14/2017 18 polyps max 12 mm ALL adenomas recall 2019 Kenney Peacemaker, MD, [facility]      Dieulafoy lesion of duodenum    Diverticulosis of colon    DJD (degenerative joint disease)    left hip   Full dentures    upper and lower   GERD (gastroesophageal reflux disease)    diet controlled    Gout    feet   Hiatal hernia    2 cm noted on upper endos 2011    Hx of adenomatous colonic polyps    Hyperlipidemia    Hypertension    Internal hemorrhoid    Lung cancer (HCC)    squamous cell (T2N0MX), 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 pt was on Plavix  x 3 years then transitioned to ASA, no problems since   Non-small cell carcinoma of right lung, stage 1 (HCC) 01/30/2016   Renal failure, acute (HCC)    hx of 2/2 medication, resolved   Tobacco abuse    smoker .5 ppd x 55 yrs   UTI (lower urinary tract infection)    Past Surgical History:  Procedure Laterality Date   ABDOMINAL AORTIC ENDOVASCULAR FENESTRATED STENT GRAFT N/A 08/16/2022   Procedure: ABDOMINAL AORTIC ENDOVASCULAR FENESTRATED STENT GRAFT;  Surgeon: Margherita Shell, MD;  Location: [facility] OR;  Service: Vascular;  Laterality: N/A;   BIOPSY  11/12/2020   Procedure: BIOPSY;  Surgeon: Albertina Hugger, MD;  Location: [facility] ENDOSCOPY;  Service: Gastroenterology;;   BIOPSY  07/25/2021   Procedure:  BIOPSY;  Surgeon: Elois Hair, MD;  Location: [facility] ENDOSCOPY;  Service: Gastroenterology;;   BIOPSY  12/24/2021   Procedure: BIOPSY;  Surgeon: Normie Becton., MD;  Location: [facility] ENDOSCOPY;  Service: Gastroenterology;;   BRONCHIAL BIOPSY  07/27/2021   Procedure: BRONCHIAL BIOPSIES;  Surgeon: Denson Flake, MD;  Location: [facility] ENDOSCOPY;  Service: Cardiopulmonary;;   BRONCHIAL BRUSHINGS  07/27/2021   Procedure: BRONCHIAL BRUSHINGS;  Surgeon: Denson Flake, MD;  Location: [facility] ENDOSCOPY;  Service: Cardiopulmonary;;   cataract surgery Bilateral 2018   removal of cataracts   COLONOSCOPY     hx polyps - Willy Harvest 02/2012   COLONOSCOPY WITH PROPOFOL  N/A 11/12/2020   Procedure: COLONOSCOPY WITH PROPOFOL ;  Surgeon: Albertina Hugger, MD;  Location: [facility] ENDOSCOPY;  Service: Gastroenterology;  Laterality: N/A;   CORONARY ANGIOPLASTY WITH STENT PLACEMENT     ENDOVASCULAR STENT INSERTION Bilateral 08/16/2022   Procedure: ENDOVASCULAR STENT GRAFT INSERTION OF BILATERAL RENAL ARTERIES;  Surgeon: Margherita Shell, MD;  Location: [facility] OR;  Service: Vascular;  Laterality: Bilateral;   ESOPHAGOGASTRODUODENOSCOPY (EGD) WITH PROPOFOL  N/A  11/12/2020   Procedure: ESOPHAGOGASTRODUODENOSCOPY (EGD) WITH PROPOFOL ;  Surgeon: Albertina Hugger, MD;  Location: [facility] ENDOSCOPY;  Service: Gastroenterology;  Laterality: N/A;   ESOPHAGOGASTRODUODENOSCOPY (EGD) WITH PROPOFOL  N/A 03/06/2021   Procedure: ESOPHAGOGASTRODUODENOSCOPY (EGD) WITH PROPOFOL ;  Surgeon: Ace Holder, MD;  Location: [facility] ENDOSCOPY;  Service: Gastroenterology;  Laterality: N/A;   ESOPHAGOGASTRODUODENOSCOPY (EGD) WITH PROPOFOL  N/A 07/25/2021   Procedure: ESOPHAGOGASTRODUODENOSCOPY (EGD) WITH PROPOFOL ;  Surgeon: Elois Hair, MD;  Location: [facility] ENDOSCOPY;  Service: Gastroenterology;  Laterality: N/A;   ESOPHAGOGASTRODUODENOSCOPY (EGD) WITH PROPOFOL  N/A 12/24/2021   Procedure: ESOPHAGOGASTRODUODENOSCOPY (EGD) WITH PROPOFOL ;  Surgeon: Brice Campi Albino Alu., MD;  Location: [facility] ENDOSCOPY;  Service: Gastroenterology;  Laterality: N/A;   flexible cystourethroscopy  05/24/06   HEMOSTASIS CLIP PLACEMENT  03/06/2021   Procedure: HEMOSTASIS CLIP PLACEMENT;  Surgeon: Ace Holder, MD;  Location: [facility] ENDOSCOPY;  Service: Gastroenterology;;   HEMOSTASIS CLIP PLACEMENT  12/24/2021   Procedure: HEMOSTASIS CLIP PLACEMENT;  Surgeon: Normie Becton., MD;  Location: [facility] ENDOSCOPY;  Service: Gastroenterology;;   HOT HEMOSTASIS N/A 03/06/2021   Procedure: HOT HEMOSTASIS (ARGON PLASMA COAGULATION/BICAP);  Surgeon: Ace Holder, MD;  Location: [facility] ENDOSCOPY;  Service: Gastroenterology;  Laterality: N/A;   INGUINAL HERNIA REPAIR  2005   INSERTION OF ILIAC STENT Bilateral 08/16/2022   Procedure: INSERTION OF ILIAC STENT RIGHT EXTERNAL ARTERY, LEFT COMMON ARTERY, AND RIGHT COMMON ARTERY;  Surgeon: Margherita Shell, MD;  Location: [facility] OR;  Service: Vascular;  Laterality: Bilateral;   IR CATHETER TUBE CHANGE  07/08/2022   LUNG LOBECTOMY Right 11/07   RUL   MULTIPLE TOOTH EXTRACTIONS     POLYPECTOMY  11/12/2020   Procedure: POLYPECTOMY;  Surgeon: Albertina Hugger, MD;  Location:  [facility] ENDOSCOPY;  Service: Gastroenterology;;   POLYPECTOMY  12/24/2021   Procedure: POLYPECTOMY;  Surgeon: Normie Becton., MD;  Location: [facility] ENDOSCOPY;  Service: Gastroenterology;;   SUBMUCOSAL LIFTING INJECTION  12/24/2021   Procedure: SUBMUCOSAL LIFTING INJECTION;  Surgeon: Normie Becton., MD;  Location: [facility] ENDOSCOPY;  Service: Gastroenterology;;   TRANSURETHRAL RESECTION OF PROSTATE N/A 05/02/2021   Procedure: TRANSURETHRAL RESECTION OF THE PROSTATE (TURP);  Surgeon: Samson Croak, MD;  Location: [facility] ORS;  Service: Urology;  Laterality: N/A;   ULTRASOUND GUIDANCE FOR VASCULAR ACCESS Bilateral 08/16/2022   Procedure: ULTRASOUND GUIDANCE FOR VASCULAR ACCESS;  Surgeon: Margherita Shell, MD;  Location: [facility] OR;  Service: Vascular;  Laterality: Bilateral;   UPPER ESOPHAGEAL ENDOSCOPIC ULTRASOUND (EUS) N/A 12/24/2021   Procedure: UPPER ESOPHAGEAL ENDOSCOPIC ULTRASOUND (EUS);  Surgeon: Normie Becton., MD;  Location: [facility] ENDOSCOPY;  Service: Gastroenterology;  Laterality: N/A;   UPPER GASTROINTESTINAL ENDOSCOPY     VIDEO BRONCHOSCOPY  07/27/2021   Procedure: VIDEO BRONCHOSCOPY WITHOUT FLUORO;  Surgeon: Denson Flake, MD;  Location: [facility] ENDOSCOPY;  Service: Cardiopulmonary;;   Family History  Problem Relation Age of Onset   Diabetes Mother    Hypertension Sister    Heart disease Sister    Diabetes Sister    Arthritis Brother    Gout Brother    Colon cancer Neg Hx    Esophageal cancer Neg Hx    Rectal cancer Neg Hx    Stomach cancer Neg Hx    Colon polyps Neg Hx    Inflammatory bowel disease Neg Hx    Liver disease Neg Hx    Pancreatic cancer Neg Hx    Social History   Socioeconomic History   Marital status: Single    Spouse name: Not on file   Number of children: 0   Years of education: Not on file   Highest education level: Not on file  Occupational History   Not on file  Tobacco Use   Smoking status: Every Day    Current packs/day: 0.75    Average  packs/day: 0.8 packs/day for 55.0 years (41.3 ttl pk-yrs)    Types: Cigarettes   Smokeless tobacco: Never   Tobacco comments:    Smokes 8-9 cigarettes per day  Vaping Use   Vaping status: Never Used  Substance and Sexual Activity   Alcohol use: Not Currently   Drug use: No   Sexual activity: Not on file  Other Topics Concern   Not on file  Social History Narrative   Patient is single reports no children   Smoker, no current alcohol or drug use   Social Drivers of Corporate investment banker Strain: Low Risk  (11/26/2023)   Overall Financial Resource Strain (CARDIA)    Difficulty of Paying Living Expenses: Not hard at all  Food Insecurity: No Food Insecurity (11/26/2023)   Hunger Vital Sign    Worried About Running Out of Food in the Last Year: Never true    Ran Out of Food in the Last Year: Never true  Transportation Needs: No Transportation Needs (11/26/2023)   PRAPARE - Administrator, Civil Service (Medical): No    Lack of Transportation (Non-Medical): No  Physical Activity: Inactive (11/26/2023)   Exercise Vital Sign    Days of Exercise per Week: 0 days    Minutes of Exercise per Session: 0 min  Stress: No Stress Concern Present (11/26/2023)   [employer] of Occupational Health - Occupational Stress Questionnaire    Feeling of Stress : Not at all  Social Connections: Moderately Isolated (11/26/2023)   Social Connection and Isolation Panel [NHANES]    Frequency of Communication with Friends and Family: More than three times a week    Frequency of Social Gatherings with Friends and Family: Three times a week    Attends Religious Services: More than 4 times per year    Active Member of Clubs or Organizations: No    Attends Banker Meetings: Never    Marital Status: Divorced    Tobacco Counseling Ready to quit: Not Answered Counseling given: Not Answered Tobacco comments: Smokes 8-9 cigarettes per day    Clinical  Intake:  Pre-visit  preparation completed: Yes  Pain : No/denies pain Pain Score: 0-No pain     BMI - recorded: 18.31 Nutritional Status: BMI <19  Underweight Nutritional Risks: None Diabetes: No  Lab Results  Component Value Date   HGBA1C 5.9 (A) 09/23/2022   HGBA1C 5.5 04/27/2021   HGBA1C 5.4 11/07/2020     How often do you need to have someone help you when you read instructions, pamphlets, or other written materials from your doctor or pharmacy?: 1 - Never  Interpreter Needed?: No  Information entered by :: Jullien Granquist N. Brock Mokry, LPN.   Activities of Daily Living     11/26/2023   10:34 AM 06/01/2023   12:24 PM  In your present state of health, do you have any difficulty performing the following activities:  Hearing? 0 0  Vision? 0 0  Difficulty concentrating or making decisions? 0 0  Walking or climbing stairs? 0   Dressing or bathing? 0   Doing errands, shopping? 0 0  Preparing Food and eating ? N   Using the Toilet? N   In the past six months, have you accidently leaked urine? Y   Comment Wears urinary catheter   Do you have problems with loss of bowel control? N   Managing your Medications? N   Managing your Finances? N   Housekeeping or managing your Housekeeping? N     Patient Care Team: Priscella Brooms, DO as PCP - General (Internal Medicine) Marlene Simas, MD (Hematology and Oncology) Lorrin Rotter, MD (Inactive) (Cardiology) [facility] as Consulting Physician (Ophthalmology)  I have updated your Care Teams any recent Medical Services you may have received from other providers in the past year.     Assessment:    This is a routine wellness examination for Perry.  Hearing/Vision screen Hearing Screening - Comments:: Denies hearing difficulties.  Vision Screening - Comments:: Wears rx glasses - up to date with [facility] exams with [facility]    Goals Addressed             This Visit's Progress    11/26/2023: To stay  independent       Blood Pressure < 140/90         Depression Screen     11/26/2023   10:34 AM 09/10/2022    2:44 PM 09/10/2022   11:05 AM 05/20/2022   11:18 AM 11/26/2021    2:10 PM 07/11/2021    5:02 PM 05/07/2021   11:21 AM  PHQ 2/9 Scores  PHQ - 2 Score 0 6 6 0 0 6 0  PHQ- 9 Score 0 23 23   20  0    Fall Risk     11/26/2023   10:33 AM 01/27/2023   10:52 AM 09/23/2022    9:40 AM 09/11/2022    2:06 PM 09/10/2022    2:44 PM  Fall Risk   Falls in the past year? 0 0 0 0 0  Number falls in past yr: 0 0 0 0 0  Injury with Fall? 0 0 0 0 0  Risk for fall due to : No Fall Risks  Impaired balance/gait Impaired balance/gait;Impaired mobility No Fall Risks  Follow up Falls evaluation completed Falls evaluation completed Falls evaluation completed Falls evaluation completed Falls evaluation completed;Falls prevention discussed    MEDICARE RISK AT HOME:  Medicare Risk at Home Any stairs in or around the home?: No If so, are there any without handrails?: No Home free  of loose throw rugs in walkways, pet beds, electrical cords, etc?: Yes Adequate lighting in your home to reduce risk of falls?: Yes Life alert?: No Use of a cane, walker or w/c?: Yes Grab bars in the bathroom?: No Shower chair or bench in shower?: No Elevated toilet seat or a handicapped toilet?: Yes  TIMED UP AND GO:  Was the test performed?  No  Cognitive Function: 6CIT completed    11/26/2023   10:57 AM  MMSE - Mini Mental State Exam  Not completed: Unable to complete        11/26/2023   10:58 AM 09/10/2022    2:44 PM  6CIT Screen  What Year? 0 points 0 points  What month? 0 points 0 points  What time? 0 points 0 points  Count back from 20 0 points 0 points  Months in reverse 0 points 0 points  Repeat phrase 0 points 0 points  Total Score 0 points 0 points    Immunizations Immunization History  Administered Date(s) Administered   Fluad Quad(high Dose 65+) 05/15/2020, 03/05/2021, 03/05/2022   Influenza  Split 03/14/2011, 06/30/2012   Influenza Whole 04/20/2009, 03/09/2010   Influenza,inj,Quad PF,6+ Mos 02/18/2013, 05/10/2014, 05/01/2015, 02/20/2016, 03/03/2017, 03/02/2018, 05/17/2019   PFIZER(Purple Top)SARS-COV-2 Vaccination 08/27/2019, 09/20/2019   Pneumococcal Conjugate-13 08/16/2014   Pneumococcal Polysaccharide-23 03/14/2011   Td 03/13/2009    Screening Tests Health Maintenance  Topic Date Due   Zoster Vaccines- Shingrix (1 of 2) Never done   DTaP/Tdap/Td (2 - Tdap) 03/14/2019   COVID-19 Vaccine (3 - Pfizer risk series) 10/18/2019   Colonoscopy  11/13/2022   Lung Cancer Screening  10/16/2023   INFLUENZA VACCINE  01/16/2024   Medicare Annual Wellness (AWV)  11/25/2024   Pneumonia Vaccine 29+ Years old  Completed   Hepatitis C Screening  Completed   HPV VACCINES  Aged Out   Meningococcal B Vaccine  Aged Out    Health Maintenance  Health Maintenance Due  Topic Date Due   Zoster Vaccines- Shingrix (1 of 2) Never done   DTaP/Tdap/Td (2 - Tdap) 03/14/2019   COVID-19 Vaccine (3 - Pfizer risk series) 10/18/2019   Colonoscopy  11/13/2022   Lung Cancer Screening  10/16/2023   Health Maintenance Items Addressed: Yes Patient is due for the following care gaps: Dtap, Covid, Shingrix, Lung Cancer Screening and Colonoscopy.  Additional Screening:  Vision Screening: Recommended annual ophthalmology exams for early detection of glaucoma and other disorders of the eye. Would you like a referral to an eye doctor? No    Dental Screening: Recommended annual dental exams for proper oral hygiene  Community Resource Referral / Chronic Care Management: CRR required this visit?  No   CCM required this visit?  No   Plan:    I have personally reviewed and noted the following in the patient's chart:   Medical and social history Use of alcohol, tobacco or illicit drugs  Current medications and supplements including opioid prescriptions. Patient is not currently taking opioid  prescriptions. Functional ability and status Nutritional status Physical activity Advanced directives List of other physicians Hospitalizations, surgeries, and ER visits in previous 12 months Vitals Screenings to include cognitive, depression, and falls Referrals and appointments  In addition, I have reviewed and discussed with patient certain preventive protocols, quality metrics, and best practice recommendations. A written personalized care plan for preventive services as well as general preventive health recommendations were provided to patient.   Margette Sheldon, LPN   09/23/8117   After Visit Summary: (  Declined) Due to this being a telephonic visit, with patients personalized plan was offered to patient but patient Declined AVS at this time   Notes: Patient is due for the following care gaps: Dtap, Covid, Shingrix, Lung Cancer Screening and Colonoscopy.

## 2023-11-26 NOTE — Patient Instructions (Signed)
 Mr. Evrard , Thank you for taking time out of your busy schedule to complete your Annual Wellness Visit with me. I enjoyed our conversation and look forward to speaking with you again next year. I, as well as your care team,  appreciate your ongoing commitment to your health goals. Please review the following plan we discussed and let me know if I can assist you in the future. Your Game plan/ To Do List    Referrals: If you haven't heard from the office you've been referred to, please reach out to them at the phone provided.   Follow up Visits: Next Medicare AWV with our clinical staff: 12/01/2024 at  10:30 a.m. phone visit with Nurse [place]   Have you seen your provider in the last 6 months (3 months if uncontrolled diabetes)? Yes Next Office Visit with your provider: Office will call patient to schedule with new pcp  Clinician Recommendations:  Aim for 30 minutes of exercise or brisk walking, 6-8 glasses of water, and 5 servings of fruits and vegetables each day.       This is a list of the screening recommended for you and due dates:  Health Maintenance  Topic Date Due   Zoster (Shingles) Vaccine (1 of 2) Never done   DTaP/Tdap/Td vaccine (2 - Tdap) 03/14/2019   COVID-19 Vaccine (3 - Pfizer risk series) 10/18/2019   Colon Cancer Screening  11/13/2022   Screening for Lung Cancer  10/16/2023   Flu Shot  01/16/2024   Medicare Annual Wellness Visit  11/25/2024   Pneumonia Vaccine  Completed   Hepatitis C Screening  Completed   HPV Vaccine  Aged Out   Meningitis B Vaccine  Aged Out    Advanced directives: (Declined) Advance directive discussed with you today. Even though you declined this today, please call our office should you change your mind, and we can give you the proper paperwork for you to fill out. Advance Care Planning is important because it:  [x]  Makes sure you receive the medical care that is consistent with your values, goals, and preferences  [x]  It provides guidance  to your family and loved ones and reduces their decisional burden about whether or not they are making the right decisions based on your wishes.  Follow the link provided in your after visit summary or read over the paperwork we have mailed to you to help you started getting your Advance Directives in place. If you need assistance in completing these, please reach out to us  so that we can help you!  See attachments for Preventive Care and Fall Prevention Tips.

## 2023-11-28 NOTE — Progress Notes (Signed)
 Internal Medicine Attending:  I reviewed the AWV findings of the medical professional who conducted the visit. I was present in the office suite and immediately available to provide assistance and direction throughout the time the service was provided.

## 2023-12-02 ENCOUNTER — Ambulatory Visit (HOSPITAL_COMMUNITY): Payer: Self-pay | Admitting: Certified Registered"

## 2023-12-02 ENCOUNTER — Encounter (HOSPITAL_COMMUNITY)
Admission: AD | Disposition: A | Discharge status: Home Health | Payer: Self-pay | Source: Home / Self Care | Attending: Surgery

## 2023-12-02 ENCOUNTER — Other Ambulatory Visit: Payer: Self-pay

## 2023-12-02 ENCOUNTER — Inpatient Hospital Stay (HOSPITAL_COMMUNITY)
Admission: AD | Admit: 2023-12-02 | Discharge: 2023-12-08 | DRG: 220 | Disposition: A | Discharge status: Home Health | Attending: Surgery | Admitting: Surgery

## 2023-12-02 DIAGNOSIS — Z79899 Other long term (current) drug therapy: Secondary | ICD-10-CM | POA: Diagnosis not present

## 2023-12-02 DIAGNOSIS — I7142 Juxtarenal abdominal aortic aneurysm, without rupture: Secondary | ICD-10-CM

## 2023-12-02 DIAGNOSIS — I4510 Unspecified right bundle-branch block: Secondary | ICD-10-CM | POA: Diagnosis not present

## 2023-12-02 DIAGNOSIS — S301XXA Contusion of abdominal wall, initial encounter: Secondary | ICD-10-CM | POA: Diagnosis not present

## 2023-12-02 DIAGNOSIS — Z79891 Long term (current) use of opiate analgesic: Secondary | ICD-10-CM

## 2023-12-02 DIAGNOSIS — Z9889 Other specified postprocedural states: Secondary | ICD-10-CM

## 2023-12-02 DIAGNOSIS — E785 Hyperlipidemia, unspecified: Secondary | ICD-10-CM | POA: Diagnosis present

## 2023-12-02 DIAGNOSIS — Z8261 Family history of arthritis: Secondary | ICD-10-CM

## 2023-12-02 DIAGNOSIS — I5022 Chronic systolic (congestive) heart failure: Secondary | ICD-10-CM | POA: Diagnosis not present

## 2023-12-02 DIAGNOSIS — I110 Hypertensive heart disease with heart failure: Secondary | ICD-10-CM | POA: Diagnosis not present

## 2023-12-02 DIAGNOSIS — Z8679 Personal history of other diseases of the circulatory system: Secondary | ICD-10-CM

## 2023-12-02 DIAGNOSIS — Z8249 Family history of ischemic heart disease and other diseases of the circulatory system: Secondary | ICD-10-CM | POA: Diagnosis not present

## 2023-12-02 DIAGNOSIS — Y712 Prosthetic and other implants, materials and accessory cardiovascular devices associated with adverse incidents: Secondary | ICD-10-CM | POA: Diagnosis present

## 2023-12-02 DIAGNOSIS — Z7902 Long term (current) use of antithrombotics/antiplatelets: Secondary | ICD-10-CM | POA: Diagnosis not present

## 2023-12-02 DIAGNOSIS — I130 Hypertensive heart and chronic kidney disease with heart failure and stage 1 through stage 4 chronic kidney disease, or unspecified chronic kidney disease: Secondary | ICD-10-CM | POA: Diagnosis not present

## 2023-12-02 DIAGNOSIS — F1011 Alcohol abuse, in remission: Secondary | ICD-10-CM | POA: Diagnosis present

## 2023-12-02 DIAGNOSIS — I714 Abdominal aortic aneurysm, without rupture, unspecified: Principal | ICD-10-CM | POA: Diagnosis present

## 2023-12-02 DIAGNOSIS — Z8546 Personal history of malignant neoplasm of prostate: Secondary | ICD-10-CM

## 2023-12-02 DIAGNOSIS — Z91013 Allergy to seafood: Secondary | ICD-10-CM | POA: Diagnosis not present

## 2023-12-02 DIAGNOSIS — D649 Anemia, unspecified: Secondary | ICD-10-CM | POA: Diagnosis present

## 2023-12-02 DIAGNOSIS — Z7982 Long term (current) use of aspirin: Secondary | ICD-10-CM

## 2023-12-02 DIAGNOSIS — I771 Stricture of artery: Secondary | ICD-10-CM | POA: Diagnosis not present

## 2023-12-02 DIAGNOSIS — Z833 Family history of diabetes mellitus: Secondary | ICD-10-CM

## 2023-12-02 DIAGNOSIS — I2510 Atherosclerotic heart disease of native coronary artery without angina pectoris: Secondary | ICD-10-CM

## 2023-12-02 DIAGNOSIS — F1110 Opioid abuse, uncomplicated: Secondary | ICD-10-CM | POA: Diagnosis present

## 2023-12-02 DIAGNOSIS — F17210 Nicotine dependence, cigarettes, uncomplicated: Secondary | ICD-10-CM

## 2023-12-02 DIAGNOSIS — I2583 Coronary atherosclerosis due to lipid rich plaque: Secondary | ICD-10-CM | POA: Diagnosis not present

## 2023-12-02 DIAGNOSIS — T82310A Breakdown (mechanical) of aortic (bifurcation) graft (replacement), initial encounter: Secondary | ICD-10-CM | POA: Diagnosis present

## 2023-12-02 DIAGNOSIS — I4821 Permanent atrial fibrillation: Secondary | ICD-10-CM | POA: Diagnosis not present

## 2023-12-02 DIAGNOSIS — Z85118 Personal history of other malignant neoplasm of bronchus and lung: Secondary | ICD-10-CM

## 2023-12-02 DIAGNOSIS — I480 Paroxysmal atrial fibrillation: Secondary | ICD-10-CM | POA: Diagnosis not present

## 2023-12-02 DIAGNOSIS — M109 Gout, unspecified: Secondary | ICD-10-CM | POA: Diagnosis present

## 2023-12-02 DIAGNOSIS — I716 Thoracoabdominal aortic aneurysm, without rupture, unspecified: Secondary | ICD-10-CM | POA: Diagnosis not present

## 2023-12-02 DIAGNOSIS — I493 Ventricular premature depolarization: Secondary | ICD-10-CM | POA: Diagnosis present

## 2023-12-02 DIAGNOSIS — I129 Hypertensive chronic kidney disease with stage 1 through stage 4 chronic kidney disease, or unspecified chronic kidney disease: Secondary | ICD-10-CM | POA: Diagnosis not present

## 2023-12-02 DIAGNOSIS — I453 Trifascicular block: Secondary | ICD-10-CM | POA: Diagnosis present

## 2023-12-02 DIAGNOSIS — K219 Gastro-esophageal reflux disease without esophagitis: Secondary | ICD-10-CM | POA: Diagnosis present

## 2023-12-02 DIAGNOSIS — N182 Chronic kidney disease, stage 2 (mild): Secondary | ICD-10-CM | POA: Diagnosis not present

## 2023-12-02 DIAGNOSIS — I70203 Unspecified atherosclerosis of native arteries of extremities, bilateral legs: Secondary | ICD-10-CM

## 2023-12-02 DIAGNOSIS — K2270 Barrett's esophagus without dysplasia: Secondary | ICD-10-CM | POA: Diagnosis not present

## 2023-12-02 DIAGNOSIS — Z955 Presence of coronary angioplasty implant and graft: Secondary | ICD-10-CM

## 2023-12-02 DIAGNOSIS — I441 Atrioventricular block, second degree: Secondary | ICD-10-CM | POA: Diagnosis present

## 2023-12-02 DIAGNOSIS — R001 Bradycardia, unspecified: Secondary | ICD-10-CM | POA: Diagnosis present

## 2023-12-02 DIAGNOSIS — N189 Chronic kidney disease, unspecified: Secondary | ICD-10-CM | POA: Diagnosis not present

## 2023-12-02 DIAGNOSIS — I739 Peripheral vascular disease, unspecified: Secondary | ICD-10-CM | POA: Diagnosis not present

## 2023-12-02 DIAGNOSIS — Z888 Allergy status to other drugs, medicaments and biological substances status: Secondary | ICD-10-CM

## 2023-12-02 DIAGNOSIS — T82330A Leakage of aortic (bifurcation) graft (replacement), initial encounter: Secondary | ICD-10-CM | POA: Diagnosis not present

## 2023-12-02 DIAGNOSIS — Z860101 Personal history of adenomatous and serrated colon polyps: Secondary | ICD-10-CM

## 2023-12-02 DIAGNOSIS — Z860109 Personal history of other colon polyps: Secondary | ICD-10-CM

## 2023-12-02 DIAGNOSIS — I5042 Chronic combined systolic (congestive) and diastolic (congestive) heart failure: Secondary | ICD-10-CM | POA: Diagnosis not present

## 2023-12-02 DIAGNOSIS — E876 Hypokalemia: Secondary | ICD-10-CM | POA: Diagnosis present

## 2023-12-02 DIAGNOSIS — M25551 Pain in right hip: Secondary | ICD-10-CM

## 2023-12-02 DIAGNOSIS — I472 Ventricular tachycardia, unspecified: Secondary | ICD-10-CM | POA: Diagnosis not present

## 2023-12-02 HISTORY — PX: ABDOMINAL AORTIC ENDOVASCULAR FENESTRATED STENT GRAFT: CATH118333

## 2023-12-02 LAB — POCT I-STAT 7, (LYTES, BLD GAS, ICA,H+H)
Acid-base deficit: 2 mmol/L (ref 0.0–2.0)
Acid-base deficit: 2 mmol/L (ref 0.0–2.0)
Acid-base deficit: 4 mmol/L — ABNORMAL HIGH (ref 0.0–2.0)
Bicarbonate: 22.3 mmol/L (ref 20.0–28.0)
Bicarbonate: 22.6 mmol/L (ref 20.0–28.0)
Bicarbonate: 22.3 mmol/L (ref 20.0–28.0)
Calcium, Ion: 1.15 mmol/L (ref 1.15–1.40)
Calcium, Ion: 1.17 mmol/L (ref 1.15–1.40)
Calcium, Ion: 1.18 mmol/L (ref 1.15–1.40)
HCT: 28 % — ABNORMAL LOW (ref 39.0–52.0)
HCT: 28 % — ABNORMAL LOW (ref 39.0–52.0)
HCT: 25 % — ABNORMAL LOW (ref 39.0–52.0)
Hemoglobin: 9.5 g/dL — ABNORMAL LOW (ref 13.0–17.0)
Hemoglobin: 9.5 g/dL — ABNORMAL LOW (ref 13.0–17.0)
Hemoglobin: 8.5 g/dL — ABNORMAL LOW (ref 13.0–17.0)
O2 Saturation: 100 %
O2 Saturation: 100 %
O2 Saturation: 100 %
Patient temperature: 34.8
Potassium: 2.4 mmol/L — CL (ref 3.5–5.1)
Potassium: 3.1 mmol/L — ABNORMAL LOW (ref 3.5–5.1)
Potassium: 3.6 mmol/L (ref 3.5–5.1)
Sodium: 143 mmol/L (ref 135–145)
Sodium: 141 mmol/L (ref 135–145)
Sodium: 141 mmol/L (ref 135–145)
TCO2: 23 mmol/L (ref 22–32)
TCO2: 24 mmol/L (ref 22–32)
TCO2: 24 mmol/L (ref 22–32)
pCO2 arterial: 37.3 mmHg (ref 32–48)
pCO2 arterial: 33.8 mmHg (ref 32–48)
pCO2 arterial: 43.2 mmHg (ref 32–48)
pH, Arterial: 7.385 (ref 7.35–7.45)
pH, Arterial: 7.423 (ref 7.35–7.45)
pH, Arterial: 7.321 — ABNORMAL LOW (ref 7.35–7.45)
pO2, Arterial: 532 mmHg — ABNORMAL HIGH (ref 83–108)
pO2, Arterial: 213 mmHg — ABNORMAL HIGH (ref 83–108)
pO2, Arterial: 208 mmHg — ABNORMAL HIGH (ref 83–108)

## 2023-12-02 LAB — BASIC METABOLIC PANEL WITH GFR
Anion gap: 9 (ref 5–15)
BUN: 24 mg/dL — ABNORMAL HIGH (ref 8–23)
CO2: 22 mmol/L (ref 22–32)
Calcium: 8 mg/dL — ABNORMAL LOW (ref 8.9–10.3)
Chloride: 109 mmol/L (ref 98–111)
Creatinine, Ser: 1.8 mg/dL — ABNORMAL HIGH (ref 0.61–1.24)
GFR, Estimated: 38 mL/min — ABNORMAL LOW (ref >60)
Glucose, Bld: 129 mg/dL — ABNORMAL HIGH (ref 70–99)
Potassium: 3.2 mmol/L — ABNORMAL LOW (ref 3.5–5.1)
Sodium: 140 mmol/L (ref 135–145)

## 2023-12-02 LAB — CBC
HCT: 29.8 % — ABNORMAL LOW (ref 39.0–52.0)
Hemoglobin: 9.1 g/dL — ABNORMAL LOW (ref 13.0–17.0)
MCH: 29.5 pg (ref 26.0–34.0)
MCHC: 30.5 g/dL (ref 30.0–36.0)
MCV: 96.8 fL (ref 80.0–100.0)
Platelets: 56 10*3/uL — ABNORMAL LOW (ref 150–400)
RBC: 3.08 MIL/uL — ABNORMAL LOW (ref 4.22–5.81)
RDW: 15.9 % — ABNORMAL HIGH (ref 11.5–15.5)
WBC: 6.2 10*3/uL (ref 4.0–10.5)
nRBC: 0 % (ref 0.0–0.2)

## 2023-12-02 LAB — APTT: aPTT: 43 s — ABNORMAL HIGH (ref 24–36)

## 2023-12-02 LAB — POCT I-STAT, CHEM 8
BUN: 27 mg/dL — ABNORMAL HIGH (ref 8–23)
Calcium, Ion: 1.23 mmol/L (ref 1.15–1.40)
Chloride: 105 mmol/L (ref 98–111)
Creatinine, Ser: 2 mg/dL — ABNORMAL HIGH (ref 0.61–1.24)
Glucose, Bld: 93 mg/dL (ref 70–99)
HCT: 37 % — ABNORMAL LOW (ref 39.0–52.0)
Hemoglobin: 12.6 g/dL — ABNORMAL LOW (ref 13.0–17.0)
Potassium: 2.8 mmol/L — ABNORMAL LOW (ref 3.5–5.1)
Sodium: 145 mmol/L (ref 135–145)
TCO2: 25 mmol/L (ref 22–32)

## 2023-12-02 LAB — GLUCOSE, CAPILLARY
Glucose-Capillary: 131 mg/dL — ABNORMAL HIGH (ref 70–99)
Glucose-Capillary: 135 mg/dL — ABNORMAL HIGH (ref 70–99)

## 2023-12-02 LAB — PROTIME-INR
INR: 1.4 — ABNORMAL HIGH (ref 0.8–1.2)
Prothrombin Time: 17.8 s — ABNORMAL HIGH (ref 11.4–15.2)

## 2023-12-02 LAB — POCT ACTIVATED CLOTTING TIME
Activated Clotting Time: 222 s
Activated Clotting Time: 245 s
Activated Clotting Time: 233 s
Activated Clotting Time: 239 s
Activated Clotting Time: 245 s
Activated Clotting Time: 222 s

## 2023-12-02 LAB — PREPARE RBC (CROSSMATCH)

## 2023-12-02 LAB — MAGNESIUM: Magnesium: 1.6 mg/dL — ABNORMAL LOW (ref 1.7–2.4)

## 2023-12-02 MED ORDER — LACTATED RINGERS IV SOLN: INTRAVENOUS | Status: DC | PRN

## 2023-12-02 MED ORDER — DOCUSATE SODIUM 100 MG PO CAPS
100.0000 mg | ORAL_CAPSULE | Freq: Every day | ORAL | Status: DC
  Administered 2023-12-03 – 2023-12-08 (×4): 100 mg via ORAL
  Filled 2023-12-02 (×6): qty 1

## 2023-12-02 MED ORDER — SODIUM CHLORIDE 0.9 % IV SOLN: 500.0000 mL | Freq: Once | INTRAVENOUS | Status: DC | PRN

## 2023-12-02 MED ORDER — ALBUTEROL SULFATE (2.5 MG/3ML) 0.083% IN NEBU: 3.0000 mL | INHALATION_SOLUTION | Freq: Four times a day (QID) | RESPIRATORY_TRACT | Status: DC | PRN

## 2023-12-02 MED ORDER — OXYCODONE HCL 5 MG PO TABS
5.0000 mg | ORAL_TABLET | ORAL | Status: DC | PRN
  Administered 2023-12-05: 10 mg via ORAL
  Administered 2023-12-05: 5 mg via ORAL
  Administered 2023-12-06 – 2023-12-08 (×3): 10 mg via ORAL
  Filled 2023-12-02 (×2): qty 2
  Filled 2023-12-02: qty 1
  Filled 2023-12-02 (×2): qty 2

## 2023-12-02 MED ORDER — METOPROLOL TARTRATE 5 MG/5ML IV SOLN: 2.0000 mg | INTRAVENOUS | Status: DC | PRN

## 2023-12-02 MED ORDER — CEFAZOLIN SODIUM-DEXTROSE 2-3 GM-%(50ML) IV SOLR
INTRAVENOUS | Status: DC | PRN
  Administered 2023-12-02: 2 g via INTRAVENOUS

## 2023-12-02 MED ORDER — ROSUVASTATIN CALCIUM 20 MG PO TABS
40.0000 mg | ORAL_TABLET | Freq: Every day | ORAL | Status: DC
  Administered 2023-12-03 – 2023-12-08 (×6): 40 mg via ORAL
  Filled 2023-12-02 (×6): qty 2

## 2023-12-02 MED ORDER — HEPARIN SODIUM (PORCINE) 1000 UNIT/ML IJ SOLN
INTRAMUSCULAR | Status: DC | PRN
Start: 2023-12-02 — End: 2023-12-02
  Administered 2023-12-02: 1000 [IU] via INTRAVENOUS

## 2023-12-02 MED ORDER — SODIUM CHLORIDE 0.9 % IV SOLN: INTRAVENOUS | Status: DC

## 2023-12-02 MED ORDER — ALUM & MAG HYDROXIDE-SIMETH 200-200-20 MG/5ML PO SUSP: 15.0000 mL | ORAL | Status: DC | PRN

## 2023-12-02 MED ORDER — DIPHENHYDRAMINE HCL 50 MG/ML IJ SOLN
INTRAMUSCULAR | Status: DC | PRN
  Administered 2023-12-02: 50 mg via INTRAVENOUS

## 2023-12-02 MED ORDER — HYDROCORTISONE SOD SUC (PF) 100 MG IJ SOLR
INTRAMUSCULAR | Status: DC | PRN
  Administered 2023-12-02: 125 mg via INTRAVENOUS

## 2023-12-02 MED ORDER — ONDANSETRON HCL 4 MG/2ML IJ SOLN
INTRAMUSCULAR | Status: DC | PRN
  Administered 2023-12-02: 4 mg via INTRAVENOUS

## 2023-12-02 MED ORDER — GUAIFENESIN-DM 100-10 MG/5ML PO SYRP: 15.0000 mL | ORAL_SOLUTION | ORAL | Status: DC | PRN

## 2023-12-02 MED ORDER — FENTANYL CITRATE (PF) 250 MCG/5ML IJ SOLN
INTRAMUSCULAR | Status: DC | PRN
  Administered 2023-12-02 (×4): 50 ug via INTRAVENOUS

## 2023-12-02 MED ORDER — ASPIRIN 81 MG PO TBEC
81.0000 mg | DELAYED_RELEASE_TABLET | Freq: Once | ORAL | Status: AC
  Administered 2023-12-02: 81 mg via ORAL
  Filled 2023-12-02: qty 1

## 2023-12-02 MED ORDER — MIDAZOLAM HCL 2 MG/2ML IJ SOLN
INTRAMUSCULAR | Status: AC
  Filled 2023-12-02: qty 2

## 2023-12-02 MED ORDER — PROTAMINE SULFATE 10 MG/ML IV SOLN
INTRAVENOUS | Status: DC | PRN
  Administered 2023-12-02: 10 mg via INTRAVENOUS
  Administered 2023-12-02 (×2): 20 mg via INTRAVENOUS

## 2023-12-02 MED ORDER — HYDRALAZINE HCL 20 MG/ML IJ SOLN: 5.0000 mg | INTRAMUSCULAR | Status: DC | PRN

## 2023-12-02 MED ORDER — HEPARIN SODIUM (PORCINE) 1000 UNIT/ML IJ SOLN
INTRAMUSCULAR | Status: AC
  Filled 2023-12-02: qty 10

## 2023-12-02 MED ORDER — ESCITALOPRAM OXALATE 10 MG PO TABS
5.0000 mg | ORAL_TABLET | Freq: Every day | ORAL | Status: DC
  Administered 2023-12-03 – 2023-12-08 (×6): 5 mg via ORAL
  Filled 2023-12-02 (×6): qty 1

## 2023-12-02 MED ORDER — POLYETHYLENE GLYCOL 3350 17 G PO PACK: 17.0000 g | PACK | Freq: Every day | ORAL | Status: DC | PRN

## 2023-12-02 MED ORDER — SUGAMMADEX SODIUM 200 MG/2ML IV SOLN
INTRAVENOUS | Status: DC | PRN
  Administered 2023-12-02 (×2): 100 mg via INTRAVENOUS
  Administered 2023-12-02: 200 mg via INTRAVENOUS

## 2023-12-02 MED ORDER — 0.9 % SODIUM CHLORIDE (POUR BTL) OPTIME
TOPICAL | Status: DC | PRN
  Administered 2023-12-02: 1000 mL

## 2023-12-02 MED ORDER — SACUBITRIL-VALSARTAN 97-103 MG PO TABS
1.0000 | ORAL_TABLET | Freq: Two times a day (BID) | ORAL | Status: DC
  Administered 2023-12-02 – 2023-12-08 (×12): 1 via ORAL
  Filled 2023-12-02 (×13): qty 1

## 2023-12-02 MED ORDER — SODIUM CHLORIDE 0.9% IV SOLUTION: Freq: Once | INTRAVENOUS | Status: DC

## 2023-12-02 MED ORDER — SODIUM CHLORIDE 0.9 % IV BOLUS
1000.0000 mL | Freq: Once | INTRAVENOUS | Status: AC
  Administered 2023-12-02: 1000 mL via INTRAVENOUS

## 2023-12-02 MED ORDER — BISACODYL 5 MG PO TBEC: 5.0000 mg | DELAYED_RELEASE_TABLET | Freq: Every day | ORAL | Status: DC | PRN

## 2023-12-02 MED ORDER — CEFAZOLIN SODIUM-DEXTROSE 2-4 GM/100ML-% IV SOLN
INTRAVENOUS | Status: AC
  Filled 2023-12-02: qty 100

## 2023-12-02 MED ORDER — MIRTAZAPINE 15 MG PO TABS
15.0000 mg | ORAL_TABLET | Freq: Every day | ORAL | Status: DC
  Administered 2023-12-02 – 2023-12-07 (×6): 15 mg via ORAL
  Filled 2023-12-02 (×6): qty 1

## 2023-12-02 MED ORDER — SENNA 8.6 MG PO TABS
1.0000 | ORAL_TABLET | Freq: Two times a day (BID) | ORAL | Status: DC
  Administered 2023-12-03 – 2023-12-08 (×8): 8.6 mg via ORAL
  Filled 2023-12-02 (×10): qty 1

## 2023-12-02 MED ORDER — ALBUMIN HUMAN 5 % IV SOLN: INTRAVENOUS | Status: DC | PRN

## 2023-12-02 MED ORDER — ACETAMINOPHEN 325 MG RE SUPP: 325.0000 mg | RECTAL | Status: DC | PRN

## 2023-12-02 MED ORDER — PHENOL 1.4 % MT LIQD: 1.0000 | OROMUCOSAL | Status: DC | PRN

## 2023-12-02 MED ORDER — ACETAMINOPHEN 325 MG PO TABS
325.0000 mg | ORAL_TABLET | ORAL | Status: DC | PRN
  Administered 2023-12-05: 650 mg via ORAL
  Filled 2023-12-02: qty 2

## 2023-12-02 MED ORDER — GLYCOPYRROLATE PF 0.2 MG/ML IJ SOSY
PREFILLED_SYRINGE | INTRAMUSCULAR | Status: DC | PRN
  Administered 2023-12-02: .2 mg via INTRAVENOUS

## 2023-12-02 MED ORDER — PANTOPRAZOLE SODIUM 40 MG PO TBEC
40.0000 mg | DELAYED_RELEASE_TABLET | Freq: Every day | ORAL | Status: DC
  Administered 2023-12-03 – 2023-12-08 (×5): 40 mg via ORAL
  Filled 2023-12-02 (×6): qty 1

## 2023-12-02 MED ORDER — HEPARIN 6000 UNIT IRRIGATION SOLUTION
Status: AC
  Filled 2023-12-02: qty 1000

## 2023-12-02 MED ORDER — EPHEDRINE SULFATE (PRESSORS) 50 MG/ML IJ SOLN
INTRAMUSCULAR | Status: DC | PRN
  Administered 2023-12-02: 5 mg via INTRAVENOUS
  Administered 2023-12-02: 2.5 mg via INTRAVENOUS
  Administered 2023-12-02: 5 mg via INTRAVENOUS

## 2023-12-02 MED ORDER — FENTANYL CITRATE (PF) 100 MCG/2ML IJ SOLN
INTRAMUSCULAR | Status: AC
Start: 2023-12-02 — End: 2023-12-02
  Filled 2023-12-02: qty 2

## 2023-12-02 MED ORDER — HEPARIN SODIUM (PORCINE) 1000 UNIT/ML IJ SOLN
INTRAMUSCULAR | Status: DC | PRN
  Administered 2023-12-02 (×2): 2000 [IU] via INTRAVENOUS
  Administered 2023-12-02: 6000 [IU] via INTRAVENOUS
  Administered 2023-12-02: 1000 [IU] via INTRAVENOUS

## 2023-12-02 MED ORDER — ONDANSETRON HCL 4 MG/2ML IJ SOLN
4.0000 mg | Freq: Four times a day (QID) | INTRAMUSCULAR | Status: DC | PRN
  Administered 2023-12-07: 4 mg via INTRAVENOUS
  Filled 2023-12-02: qty 2

## 2023-12-02 MED ORDER — LIDOCAINE 2% (20 MG/ML) 5 ML SYRINGE
INTRAMUSCULAR | Status: DC | PRN
  Administered 2023-12-02: 100 mg via INTRAVENOUS

## 2023-12-02 MED ORDER — HEPARIN 6000 UNIT IRRIGATION SOLUTION
Status: DC | PRN
  Administered 2023-12-02: 1

## 2023-12-02 MED ORDER — METOPROLOL SUCCINATE ER 25 MG PO TB24: 25.0000 mg | ORAL_TABLET | Freq: Every day | ORAL | Status: DC

## 2023-12-02 MED ORDER — PHENYLEPHRINE HCL-NACL 20-0.9 MG/250ML-% IV SOLN
INTRAVENOUS | Status: DC | PRN
  Administered 2023-12-02: 30 ug/min via INTRAVENOUS

## 2023-12-02 MED ORDER — AMLODIPINE BESYLATE 5 MG PO TABS: 10.0000 mg | ORAL_TABLET | Freq: Every day | ORAL | Status: DC

## 2023-12-02 MED ORDER — POTASSIUM CHLORIDE CRYS ER 20 MEQ PO TBCR
20.0000 meq | EXTENDED_RELEASE_TABLET | Freq: Every day | ORAL | Status: AC | PRN
  Administered 2023-12-03: 40 meq via ORAL
  Filled 2023-12-02: qty 2

## 2023-12-02 MED ORDER — MAGNESIUM SULFATE 2 GM/50ML IV SOLN: 2.0000 g | Freq: Every day | INTRAVENOUS | Status: DC | PRN

## 2023-12-02 MED ORDER — PROPOFOL 10 MG/ML IV BOLUS
INTRAVENOUS | Status: DC | PRN
  Administered 2023-12-02: 40 mg via INTRAVENOUS
  Administered 2023-12-02: 60 mg via INTRAVENOUS

## 2023-12-02 MED ORDER — LABETALOL HCL 5 MG/ML IV SOLN: 10.0000 mg | INTRAVENOUS | Status: DC | PRN

## 2023-12-02 MED ORDER — MORPHINE SULFATE (PF) 2 MG/ML IV SOLN: 2.0000 mg | INTRAVENOUS | Status: DC | PRN

## 2023-12-02 MED ORDER — ROCURONIUM BROMIDE 10 MG/ML (PF) SYRINGE
PREFILLED_SYRINGE | INTRAVENOUS | Status: DC | PRN
  Administered 2023-12-02 (×2): 20 mg via INTRAVENOUS
  Administered 2023-12-02: 60 mg via INTRAVENOUS
  Administered 2023-12-02: 20 mg via INTRAVENOUS

## 2023-12-02 MED ORDER — IODIXANOL 320 MG/ML IV SOLN
INTRAVENOUS | Status: DC | PRN
Start: 2023-12-02 — End: 2023-12-02
  Administered 2023-12-02: 100 mL

## 2023-12-02 MED ORDER — CEFAZOLIN SODIUM-DEXTROSE 2-4 GM/100ML-% IV SOLN
2.0000 g | Freq: Three times a day (TID) | INTRAVENOUS | Status: AC
  Administered 2023-12-02 (×2): 2 g via INTRAVENOUS
  Filled 2023-12-02 (×2): qty 100

## 2023-12-02 MED ORDER — POTASSIUM CHLORIDE 10 MEQ/100ML IV SOLN
10.0000 meq | INTRAVENOUS | Status: AC
  Administered 2023-12-02 (×4): 10 meq via INTRAVENOUS

## 2023-12-02 MED ORDER — PANCRELIPASE (LIP-PROT-AMYL) 36000-114000 UNITS PO CPEP
72000.0000 [IU] | ORAL_CAPSULE | Freq: Three times a day (TID) | ORAL | Status: DC
  Administered 2023-12-03 – 2023-12-06 (×11): 72000 [IU] via ORAL
  Filled 2023-12-02 (×19): qty 2

## 2023-12-02 NOTE — Anesthesia Procedure Notes (Signed)
 Procedure Name: Intubation Date/Time: 12/02/2023 8:29 AM  Performed by: Geannie Keener, CRNAPre-anesthesia Checklist: Patient identified, Emergency Drugs available, Suction available and Patient being monitored Patient Re-evaluated:Patient Re-evaluated prior to induction Oxygen Delivery Method: Circle System Utilized Preoxygenation: Pre-oxygenation with 100% oxygen Induction Type: IV induction Ventilation: Mask ventilation without difficulty Laryngoscope Size: Miller and 3 Grade View: Grade II Tube type: Oral Tube size: 7.5 mm Number of attempts: 2 Airway Equipment and Method: Stylet and Oral airway Placement Confirmation: ETT inserted through vocal cords under direct vision, positive ETCO2 and breath sounds checked- equal and bilateral Secured at: 22 cm Tube secured with: Tape Dental Injury: Teeth and Oropharynx as per pre-operative assessment

## 2023-12-02 NOTE — Op Note (Signed)
 Patient name: [name] MRN: [id number] DOB: [birth year] Sex: male  12/02/2023 Pre-operative Diagnosis: Thoracoabdominal aneurysm Post-operative diagnosis:  Same Surgeon:  Gareld June Co-Surgeon: Delaney Fearing Procedure:   #1: Endovascular repair of a thoracic aneurysm without coverage of the left subclavian artery   #2: Fenestrated endovascular repair of abdominal aortic aneurysm (4 vessels)   #3: Stent, celiac artery   #4: Stent, superior mesenteric artery   #5: Stent, left renal artery   #6:  stent, right renal artery   #7: Bilateral ultrasound-guided common femoral artery cannulation Anesthesia:  General Blood Loss:  150cc Specimens:  none  Findings: Complete exclusion  Devices used: Cook thoracic 38 x 30 x 159 SMA stent: 9 x 29 VBX Celiac stent: 9 x 29 Right renal artery stent: VBX 6 x 29 Left renal artery stent: 6 x 39  Indications: This is a 78 year old gentleman who is status post fenestrated juxtarenal abdominal aortic aneurysm repair on 08/16/2022 for 7.9 cm aneurysm.  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  He comes back in today for four-vessel fenestration.  Procedure:  The patient was identified in the holding area and taken to [facility] (SURGICAL)  The patient was then placed supine on the table. general anesthesia was administered.  The patient was prepped and draped in the usual sterile fashion.  A time out was called and antibiotics were administered.  2 surgeons were required for this procedure due to the complexity.  Please see Dr. Gae Jointer op note.  He performed the back table portion of the repair with my help as well as multiple portions of the stent graft deployment.  On the back table a Cook thoracic 38 x 30 x 159 device was partially deployed.  Fenestrations were created with eye cautery for the 4 visceral vessels.  Metallic coils were sutured around the fenestrations.  The celiac was  positioned 45 mm down from the top of the graft.  The SMA was 69 mm down from the top.  The left renal was 107 down in the right renal was 109 down.  The graft was then resheath.  Ultrasound was used to evaluate bilateral common femoral arteries which were circumferentially calcified.  A #11 blade was used to make a skin nick bilaterally.  Bilateral common femoral arteries were cannulated under ultrasound guidance with a micropuncture needle.  An 018 wire was inserted followed by placement of a micropuncture sheath.  I had to use a Berenstein 2 catheter to get wire access through the graft into the aorta.  The subcutaneous tract was dilated with an 8 [place] dilator.  Pro-glide devices were deployed at the 11:00 and 1 o'clock position for preclosure.  Several devices were required in the right.  Ultimately 8 French sheaths were placed bilaterally.  The patient was fully heparinized.  Heparin  levels were monitored with ACT measurements and redosed appropriately.  A Lunderquist wire was placed up the right into the thoracic aorta and a 18 French Gore dry seal sheath was inserted into the aorta.  We then cannulated both renal stents with a 6.5 [place] Oscor sheath.  The left renal was cannulated from the left groin.  Once we had wire access out into the renal artery, a 12 mm balloon was used to dilate the left renal stent.  Similarly the right renal stent was cannulated from the Oscor sheath in the right groin and a 12 mm balloon was used to dilate  the stent in the right renal artery.  Next a Lunderquist wire was advanced up the left side into the descending thoracic aorta.  We tried to advance the device into the existing device but could not get it through the tortuous left iliac artery.  The device was removed and replaced with a 20 [place] Gore dry seal sheath.  A trilobed balloon was then used to dilate the left limb of the existing graft.  After this was done the dry seal sheath was removed and the new device  was able to be advanced into the aorta with a moderate amount of resistance.  Once the device was positioned appropriately, it was on sheath.  We then cannulated the new graft from the right side and advanced the 18 French sheath into the new device.  Using a 6.5 Oscor sheath through the 18 French dry seal, we ultimately cannulated the left renal fenestration and ultimately got a wire out into the left renal artery which was confirmed with a contrast injection.  A quick cross catheter was advanced over the Glidewire and the left renal artery and a Rosen wire was placed.  The Oscor sheath was then removed off of the wire and then reinserted into the dry seal sheath.  This was used to cannulate the right renal stent and ultimately the right renal artery.  Over the Glidewire in the right renal artery a quick cross catheter was placed followed by a Rosen wire.  A contrast injection was performed with the cath in the right renal artery confirming successful cannulation.  Next using the Oscor sheath the superior mesenteric artery and celiac artery were each cannulated and Rosen wires were placed.  Once all 4 Rosen wires were out the visceral vessels, a trilobed balloon was used to pop the constraining sutures around the new device.  Before doing this a 7 French 55 cm Ansell sheath was advanced into the left renal artery and the superior mesenteric artery.  We then proceeded with stenting of the visceral vessels.  The left renal artery was done first.  This was a VBX 6 x 39.  It was deployed with 1 stent out into the aorta.  The superior mesenteric artery was done next.  This was a 9 x 29 VBX.  The Ansell sheaths were removed and then 1 was advanced out the right renal artery which was stented with a 6 x 29 VBX.  The Ansell sheath was then advanced out the celiac artery and this was stented with a 9 x 29 VBX.  Following each stent deployment, contrast injections were performed through the [facility] sheath in each stent which  confirmed successful patency of each stent and preservation of blood flow through each of the visceral vessels.  Because of the patient's renal disease, I elected not to perform completion angiography.  The wires were then removed and exchanged for Bentson wires.  I then performed a retrograde injection through the sheath in the right groin which confirmed patency of the right hypogastric fenestration which had previously been placed.  The Pro-glide devices were then used to close the arteriotomy sites.  The patient had palpable femoral pulses and Doppler pedal signals.  The patient's heparin  was then reversed with 50 mg of protamine .  Cautery was used on the skin nicks followed by Dermabond.  The patient was successfully extubated and found to be moving all 4 extremities to command.  He was taken recovery in stable condition.  There were no immediate complications.  Disposition: To PACU stable.   Reinaldo Caras, M.D., [facility] [place] Office: [phone] Pager:  [phone]

## 2023-12-02 NOTE — Anesthesia Procedure Notes (Addendum)
 Central Venous Catheter Insertion Performed by: Leslye Rast, MD, anesthesiologist Start/End6/17/2025 7:25 AM, 12/02/2023 7:30 AM Patient location: Pre-op. Preanesthetic checklist: patient identified, IV checked, site marked, risks and benefits discussed, surgical consent, monitors and equipment checked, pre-op evaluation, timeout performed and anesthesia consent Lidocaine  1% used for infiltration and patient sedated Hand hygiene performed  and maximum sterile barriers used  Catheter size: 8.5 Fr Sheath introducer Procedure performed using ultrasound guided technique. Ultrasound Notes:anatomy identified, needle tip was noted to be adjacent to the nerve/plexus identified, no ultrasound evidence of intravascular and/or intraneural injection and image(s) printed for medical record Attempts: 1 Following insertion, line sutured and dressing applied. Post procedure assessment: blood return through all ports, free fluid flow and no air  Patient tolerated the procedure well with no immediate complications.

## 2023-12-02 NOTE — Anesthesia Postprocedure Evaluation (Signed)
 Anesthesia Post Note  Patient: [name]  Procedure(s) Performed: ABDOMINAL AORTIC ENDOVASCULAR FENESTRATED STENT GRAFT WITH ULTRASOUND GUIDED ACCESS     Patient location during evaluation: PACU Anesthesia Type: General Level of consciousness: awake Pain management: pain level controlled Vital Signs Assessment: post-procedure vital signs reviewed and stable Respiratory status: spontaneous breathing, nonlabored ventilation and respiratory function stable Cardiovascular status: blood pressure returned to baseline and stable Postop Assessment: no apparent nausea or vomiting Anesthetic complications: no  No notable events documented.  Last Vitals:  Vitals:   12/02/23 1415 12/02/23 1430  BP: (!) 111/58 (!) 112/57  Pulse: (!) 58 (!) 58  Resp: 16 19  Temp:    SpO2: 100% 100%    Last Pain:  Vitals:   12/02/23 1430  TempSrc:   PainSc: Asleep                 Nahomy Limburg,W. EDMOND

## 2023-12-02 NOTE — Anesthesia Procedure Notes (Signed)
 Central Venous Catheter Insertion Performed by: anesthesiologist Start/End6/17/2025 7:35 AM Patient location: Pre-op. Preanesthetic checklist: patient identified, IV checked, site marked, risks and benefits discussed, surgical consent, monitors and equipment checked, pre-op evaluation, timeout performed and anesthesia consent Position: Trendelenburg Lidocaine  1% used for infiltration and patient sedated Hand hygiene performed  and maximum sterile barriers used  Catheter size: 8.5 Fr Sheath introducer Procedure performed using ultrasound guided technique. Ultrasound Notes:anatomy identified, needle tip was noted to be adjacent to the nerve/plexus identified, no ultrasound evidence of intravascular and/or intraneural injection and image(s) printed for medical record Attempts: 1 Following insertion, line sutured and dressing applied. Post procedure assessment: blood return through all ports, free fluid flow and no air  Patient tolerated the procedure well with no immediate complications.

## 2023-12-02 NOTE — Anesthesia Preprocedure Evaluation (Addendum)
 Anesthesia Evaluation  Patient identified by MRN, date of birth, ID band Patient awake    Reviewed: Allergy & Precautions, NPO status , Patient's Chart, lab work & pertinent test results, reviewed documented beta blocker date and time   History of Anesthesia Complications Negative for: history of anesthetic complications  Airway Mallampati: I       Dental  (+) Edentulous Upper, Edentulous Lower   Pulmonary shortness of breath and with exertion, COPD, Current Smoker   breath sounds clear to auscultation       Cardiovascular hypertension, + CAD, + Past MI and + Peripheral Vascular Disease  (-) CABG and (-) CHF  Rhythm:Irregular Rate:Normal  IMPRESSIONS     1. Left ventricular ejection fraction, by estimation, is 45 to 50%. The  left ventricle has mildly decreased function. The left ventricle  demonstrates regional wall motion abnormalities with basal to mid inferior  severe hypokinesis. Left ventricular  diastolic parameters are indeterminate.   2. Right ventricular systolic function is mildly reduced. The right  ventricular size is mildly enlarged. There is moderately elevated  pulmonary artery systolic pressure. The estimated right ventricular  systolic pressure is 57.6 mmHg.   3. Left atrial size was mild to moderately dilated.   4. The mitral valve is normal in structure. Mild mitral valve  regurgitation. No evidence of mitral stenosis.   5. The aortic valve is tricuspid. Aortic valve regurgitation is not  visualized. No aortic stenosis is present.   6. The inferior vena cava is normal in size with <50% respiratory  variability, suggesting right atrial pressure of 8 mmHg.   7. The patient was in atrial fibrillation.      Neuro/Psych  PSYCHIATRIC DISORDERS       Neuromuscular disease    GI/Hepatic hiatal hernia,GERD  ,,  Endo/Other    Renal/GU CRFRenal disease     Musculoskeletal  (+) Arthritis ,     Abdominal   Peds  Hematology  (+) Blood dyscrasia, anemia   Anesthesia Other Findings   Reproductive/Obstetrics                              Anesthesia Physical Anesthesia Plan  ASA: 3  Anesthesia Plan: General   Post-op Pain Management:    Induction: Intravenous  PONV Risk Score and Plan: 2 and Ondansetron  and Dexamethasone   Airway Management Planned: Oral ETT  Additional Equipment: Arterial line and Ultrasound Guidance Line Placement  Intra-op Plan:   Post-operative Plan: Extubation in OR and Possible Post-op intubation/ventilation  Informed Consent: I have reviewed the patients History and Physical, chart, labs and discussed the procedure including the risks, benefits and alternatives for the proposed anesthesia with the patient or authorized representative who has indicated his/her understanding and acceptance.       Plan Discussed with: CRNA  Anesthesia Plan Comments:          Anesthesia Quick Evaluation

## 2023-12-02 NOTE — Transfer of Care (Signed)
 Immediate Anesthesia Transfer of Care Note  Patient: [name]  Procedure(s) Performed: ABDOMINAL AORTIC ENDOVASCULAR FENESTRATED STENT GRAFT WITH ULTRASOUND GUIDED ACCESS  Patient Location: PACU  Anesthesia Type:General  Level of Consciousness: awake, alert , patient cooperative, and responds to stimulation  Airway & Oxygen Therapy: Patient Spontanous Breathing and Patient connected to face mask oxygen  Post-op Assessment: Report given to RN, Post -op Vital signs reviewed and stable, and Patient moving all extremities X 4  Post vital signs: Reviewed and stable  Last Vitals:  Vitals Value Taken Time  BP 125/69 12/02/23 13:37  Temp    Pulse 69 12/02/23 13:42  Resp 20 12/02/23 13:42  SpO2 96 % 12/02/23 13:42  Vitals shown include unfiled device data.  Last Pain:  Vitals:   12/02/23 0557  TempSrc:   PainSc: 0-No pain      Patients Stated Pain Goal: 4 (12/02/23 0557)  Complications: No notable events documented.

## 2023-12-02 NOTE — Interval H&P Note (Signed)
 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  The various methods of treatment have been discussed with the patient and family. After consideration of risks, benefits and other options for treatment, the patient has consented to  Procedure(s): ABDOMINAL AORTIC ENDOVASCULAR FENESTRATED STENT GRAFT (N/A) as a surgical intervention.  The patient's history has been reviewed, patient examined, no change in status, stable for surgery.  I have reviewed the patient's chart and labs.  Questions were answered to the patient's satisfaction.     Gareld June

## 2023-12-03 ENCOUNTER — Encounter (HOSPITAL_COMMUNITY): Payer: Self-pay | Admitting: Surgery

## 2023-12-03 DIAGNOSIS — Z95828 Presence of other vascular implants and grafts: Secondary | ICD-10-CM

## 2023-12-03 DIAGNOSIS — I4510 Unspecified right bundle-branch block: Secondary | ICD-10-CM

## 2023-12-03 DIAGNOSIS — I480 Paroxysmal atrial fibrillation: Secondary | ICD-10-CM

## 2023-12-03 DIAGNOSIS — I2510 Atherosclerotic heart disease of native coronary artery without angina pectoris: Secondary | ICD-10-CM

## 2023-12-03 DIAGNOSIS — I472 Ventricular tachycardia, unspecified: Secondary | ICD-10-CM

## 2023-12-03 DIAGNOSIS — I2583 Coronary atherosclerosis due to lipid rich plaque: Secondary | ICD-10-CM

## 2023-12-03 DIAGNOSIS — I5042 Chronic combined systolic (congestive) and diastolic (congestive) heart failure: Secondary | ICD-10-CM

## 2023-12-03 LAB — GLUCOSE, CAPILLARY
Glucose-Capillary: 119 mg/dL — ABNORMAL HIGH (ref 70–99)
Glucose-Capillary: 120 mg/dL — ABNORMAL HIGH (ref 70–99)
Glucose-Capillary: 119 mg/dL — ABNORMAL HIGH (ref 70–99)
Glucose-Capillary: 106 mg/dL — ABNORMAL HIGH (ref 70–99)
Glucose-Capillary: 93 mg/dL (ref 70–99)

## 2023-12-03 LAB — CBC
HCT: 25.4 % — ABNORMAL LOW (ref 39.0–52.0)
Hemoglobin: 7.9 g/dL — ABNORMAL LOW (ref 13.0–17.0)
MCH: 29.9 pg (ref 26.0–34.0)
MCHC: 31.1 g/dL (ref 30.0–36.0)
MCV: 96.2 fL (ref 80.0–100.0)
Platelets: 49 10*3/uL — ABNORMAL LOW (ref 150–400)
RBC: 2.64 MIL/uL — ABNORMAL LOW (ref 4.22–5.81)
RDW: 16.1 % — ABNORMAL HIGH (ref 11.5–15.5)
WBC: 6.3 10*3/uL (ref 4.0–10.5)
nRBC: 0 % (ref 0.0–0.2)

## 2023-12-03 LAB — BASIC METABOLIC PANEL WITH GFR
Anion gap: 9 (ref 5–15)
BUN: 23 mg/dL (ref 8–23)
CO2: 21 mmol/L — ABNORMAL LOW (ref 22–32)
Calcium: 7.9 mg/dL — ABNORMAL LOW (ref 8.9–10.3)
Chloride: 110 mmol/L (ref 98–111)
Creatinine, Ser: 1.86 mg/dL — ABNORMAL HIGH (ref 0.61–1.24)
GFR, Estimated: 37 mL/min — ABNORMAL LOW (ref >60)
Glucose, Bld: 106 mg/dL — ABNORMAL HIGH (ref 70–99)
Potassium: 3.3 mmol/L — ABNORMAL LOW (ref 3.5–5.1)
Sodium: 140 mmol/L (ref 135–145)

## 2023-12-03 MED ORDER — METOPROLOL SUCCINATE ER 25 MG PO TB24: 12.5000 mg | ORAL_TABLET | Freq: Every day | ORAL | Status: DC

## 2023-12-03 MED FILL — Fentanyl Citrate Preservative Free (PF) Inj 100 MCG/2ML: INTRAMUSCULAR | Qty: 4 | Status: AC

## 2023-12-03 NOTE — Consult Note (Signed)
 Cardiology Consultation   Patient ID: [name] MRN: [id number]; DOB: 1946-04-26  Admit date: 12/02/2023 Date of Consult: 12/03/2023  PCP:  [name] Brooms, DO   [facility] Providers Cardiologist:  New to [name]   History of Present Illness: [name] is a 78 year old with coronary artery disease and atrial fibrillation who presents for evaluation of anemia and medication management. He has not seen cardiology since his last follow-up with Dr. Ardell [name].  Called for cardiac support in the setting of bradycardia.  He has a history of coronary artery disease with prior RCA and circumflex stents, and thoracic aortic aneurysm with previous thoracic aortic and iliac artery surgery. He is followed by vascular surgery and underwent endovascular repair with thoracic arteriosclerosis, celiac, SMA stenting, and renal stenting. No chest pain, shortness of breath, or palpitations.  He has a history of anemia, with a hemoglobin baseline of 9, which has worsened to 7.9 without evidence of hematoma. He experiences relative hypotension in the setting of anemia.  He has permanent atrial fibrillation and has experienced pauses less than five seconds. He is on metoprolol  succinate 25 mg daily, Entresto  97/103 mg BID, rosuvastatin  40 mg daily, and Norvasc  10 mg for hypertension.  He has a history of chronic opioid and alcohol abuse, and is an active smoker. He has baseline bradycardia and a history of significant bradycardia in 2023, leading to a reduction in metoprolol  dosage. He has a known right bundle branch block and left anterior fascicular block.  He has a history of ventricular tachycardia noted on August 31, 2021 (unable to view strip), and has been considered too high risk for anticoagulation due to his comorbidities. He has no RVR and has been normotensive when blood pressure is taken appropriately.  He is largely sedentary, spending most of his time watching TV. He is  able to cook for himself and his brother often drives him to the grocery store. He has a suprapubic catheter at baseline and reports no chest pain, shortness of breath, or palpitations. He is unable to name his medications specifically.   Past Medical History:  Diagnosis Date   AAA (abdominal aortic aneurysm) (HCC)    followed by vasc surgery   Alcohol abuse    Barretts esophagus    bx distal esophagus 2011 neg    Bladder outlet obstruction 04/02/2018   CAD in native artery    a. s/p stent to prox RCA and mid Cx.   Cataract    Cholelithiasis    Chronic osteomyelitis involving ankle and foot, right (HCC) 07/13/2018   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   2006 - 3 adenomas largest 12 mm  2008 - no polyps 2013-  2 mm cecal polyp, adenoma, routine repeat colonoscopy 02/2017 approx 05/14/2017 18 polyps max 12 mm ALL adenomas recall 2019 [name] Peacemaker, MD, [facility]      Dieulafoy lesion of duodenum    Diverticulosis of colon    DJD (degenerative joint disease)    left hip   Full dentures    upper and lower   GERD (gastroesophageal reflux disease)    diet controlled    Gout    feet   Hiatal hernia    2 cm noted on upper endos 2011    Hx of adenomatous colonic polyps    Hyperlipidemia    Hypertension    Internal hemorrhoid    Lung cancer (HCC)    squamous cell ([id number]), 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 pt was on Plavix  x 3 years then transitioned to ASA, no problems since   Non-small cell carcinoma of right lung, stage 1 (HCC) 01/30/2016   Renal failure, acute (HCC)    hx of 2/2 medication, resolved   Tobacco abuse    smoker .5 ppd x 55 yrs   UTI (lower urinary tract infection)     Past Surgical History:  Procedure Laterality Date   ABDOMINAL AORTIC ENDOVASCULAR FENESTRATED STENT GRAFT N/A 08/16/2022   Procedure: ABDOMINAL AORTIC ENDOVASCULAR FENESTRATED STENT GRAFT;  Surgeon: [name] Shell, MD;  [facility]: [facility]  OR;  Service: Vascular;  Laterality: N/A;   ABDOMINAL AORTIC ENDOVASCULAR FENESTRATED STENT GRAFT N/A 12/02/2023   Procedure: ABDOMINAL AORTIC ENDOVASCULAR FENESTRATED STENT GRAFT WITH ULTRASOUND GUIDED ACCESS;  Surgeon: [name] Shell, MD;  Location: [facility] INVASIVE CV LAB;  Service: Cardiovascular;  Laterality: N/A;   BIOPSY  11/12/2020   Procedure: BIOPSY;  Surgeon: [name] Hugger, MD;  Location: [facility] ENDOSCOPY;  Service: Gastroenterology;;   BIOPSY  07/25/2021   Procedure: BIOPSY;  Surgeon: [name] Hair, MD;  Location: [facility] ENDOSCOPY;  Service: Gastroenterology;;   BIOPSY  12/24/2021   Procedure: BIOPSY;  Surgeon: [name] Becton., MD;  Location: [facility] ENDOSCOPY;  Service: Gastroenterology;;   BRONCHIAL BIOPSY  07/27/2021   Procedure: BRONCHIAL BIOPSIES;  Surgeon: [name] Flake, MD;  Location: [facility] ENDOSCOPY;  Service: Cardiopulmonary;;   BRONCHIAL BRUSHINGS  07/27/2021   Procedure: BRONCHIAL BRUSHINGS;  Surgeon: [name] Flake, MD;  Location: [facility] ENDOSCOPY;  Service: Cardiopulmonary;;   cataract surgery Bilateral 2018   removal of cataracts   COLONOSCOPY     hx polyps - [name] 02/2012   COLONOSCOPY WITH PROPOFOL  N/A 11/12/2020   Procedure: COLONOSCOPY WITH PROPOFOL ;  Surgeon: [name] Hugger, MD;  Location: [facility] ENDOSCOPY;  Service: Gastroenterology;  Laterality: N/A;   CORONARY ANGIOPLASTY WITH STENT PLACEMENT     ENDOVASCULAR STENT INSERTION Bilateral 08/16/2022   Procedure: ENDOVASCULAR STENT GRAFT INSERTION OF BILATERAL RENAL ARTERIES;  Surgeon: [name] Shell, MD;  [facility]: [facility] OR;  Service: Vascular;  Laterality: Bilateral;   ESOPHAGOGASTRODUODENOSCOPY (EGD) WITH PROPOFOL  N/A 11/12/2020   Procedure: ESOPHAGOGASTRODUODENOSCOPY (EGD) WITH PROPOFOL ;  Surgeon: [name] Hugger, MD;  Location: [facility] ENDOSCOPY;  Service: Gastroenterology;  Laterality: N/A;   ESOPHAGOGASTRODUODENOSCOPY (EGD) WITH PROPOFOL  N/A 03/06/2021   Procedure: ESOPHAGOGASTRODUODENOSCOPY (EGD) WITH PROPOFOL ;   Surgeon: Ace Holder, MD;  Location: [facility] ENDOSCOPY;  Service: Gastroenterology;  Laterality: N/A;   ESOPHAGOGASTRODUODENOSCOPY (EGD) WITH PROPOFOL  N/A 07/25/2021   Procedure: ESOPHAGOGASTRODUODENOSCOPY (EGD) WITH PROPOFOL ;  Surgeon: [name] Hair, MD;  Location: [facility] ENDOSCOPY;  Service: Gastroenterology;  Laterality: N/A;   ESOPHAGOGASTRODUODENOSCOPY (EGD) WITH PROPOFOL  N/A 12/24/2021   Procedure: ESOPHAGOGASTRODUODENOSCOPY (EGD) WITH PROPOFOL ;  Surgeon: Brice Campi Albino Alu., MD;  Location: [facility] ENDOSCOPY;  Service: Gastroenterology;  Laterality: N/A;   flexible cystourethroscopy  05/24/06   HEMOSTASIS CLIP PLACEMENT  03/06/2021   Procedure: HEMOSTASIS CLIP PLACEMENT;  Surgeon: Ace Holder, MD;  Location: [facility] ENDOSCOPY;  Service: Gastroenterology;;   HEMOSTASIS CLIP PLACEMENT  12/24/2021   Procedure: HEMOSTASIS CLIP PLACEMENT;  Surgeon: [name] Becton., MD;  Location: [facility] ENDOSCOPY;  Service: Gastroenterology;;   HOT HEMOSTASIS N/A 03/06/2021   Procedure: HOT HEMOSTASIS (ARGON PLASMA COAGULATION/BICAP);  Surgeon: Ace Holder, MD;  Location: [facility] ENDOSCOPY;  Service: Gastroenterology;  Laterality: N/A;   INGUINAL HERNIA REPAIR  2005   INSERTION OF ILIAC STENT Bilateral 08/16/2022  Procedure: INSERTION OF ILIAC STENT RIGHT EXTERNAL ARTERY, LEFT COMMON ARTERY, AND RIGHT COMMON ARTERY;  Surgeon: [name] Shell, MD;  Location: [facility] OR;  Service: Vascular;  Laterality: Bilateral;   IR CATHETER TUBE CHANGE  07/08/2022   LUNG LOBECTOMY Right 11/07   RUL   MULTIPLE TOOTH EXTRACTIONS     POLYPECTOMY  11/12/2020   Procedure: POLYPECTOMY;  Surgeon: [name] Hugger, MD;  Location: [facility] ENDOSCOPY;  Service: Gastroenterology;;   POLYPECTOMY  12/24/2021   Procedure: POLYPECTOMY;  Surgeon: [name] Becton., MD;  Location: [facility] ENDOSCOPY;  Service: Gastroenterology;;   SUBMUCOSAL LIFTING INJECTION  12/24/2021   Procedure: SUBMUCOSAL LIFTING INJECTION;  Surgeon: [name] Becton., MD;  Location: [facility] ENDOSCOPY;  Service: Gastroenterology;;   TRANSURETHRAL RESECTION OF PROSTATE N/A 05/02/2021   Procedure: TRANSURETHRAL RESECTION OF THE PROSTATE (TURP);  Surgeon: Samson Croak, MD;  Location: [facility] ORS;  Service: Urology;  Laterality: N/A;   ULTRASOUND GUIDANCE FOR VASCULAR ACCESS Bilateral 08/16/2022   Procedure: ULTRASOUND GUIDANCE FOR VASCULAR ACCESS;  Surgeon: [name] Shell, MD;  Location: [facility] OR;  Service: Vascular;  Laterality: Bilateral;   UPPER ESOPHAGEAL ENDOSCOPIC ULTRASOUND (EUS) N/A 12/24/2021   Procedure: UPPER ESOPHAGEAL ENDOSCOPIC ULTRASOUND (EUS);  Surgeon: [name] Becton., MD;  Location: [facility] ENDOSCOPY;  Service: Gastroenterology;  Laterality: N/A;   UPPER GASTROINTESTINAL ENDOSCOPY     VIDEO BRONCHOSCOPY  07/27/2021   Procedure: VIDEO BRONCHOSCOPY WITHOUT FLUORO;  Surgeon: [name] Flake, MD;  Location: [facility] ENDOSCOPY;  Service: Cardiopulmonary;;     Scheduled Meds:  sodium chloride    Intravenous Once   sodium chloride    Intravenous Once   amLODipine   10 mg Oral Daily   docusate sodium   100 mg Oral Daily   escitalopram   5 mg Oral Daily   lipase/protease/amylase  72,000 Units Oral TID AC   metoprolol  succinate  25 mg Oral Daily   mirtazapine   15 mg Oral QHS   pantoprazole   40 mg Oral Daily   rosuvastatin   40 mg Oral Daily   sacubitril -valsartan   1 tablet Oral BID   senna  1 tablet Oral BID WC   Continuous Infusions:  sodium chloride      magnesium  sulfate bolus IVPB     PRN Meds: sodium chloride , acetaminophen  **OR** acetaminophen , albuterol , alum & mag hydroxide-simeth, bisacodyl , guaiFENesin -dextromethorphan, hydrALAZINE , labetalol , magnesium  sulfate bolus IVPB, metoprolol  tartrate, morphine  injection, ondansetron , oxyCODONE , phenol, polyethylene glycol  Allergies:    Allergies  Allergen Reactions   Candesartan Cilexetil-Hctz Other (See Comments)    Acute renal failure   Hydrochlorothiazide Other (See Comments)    Acute renal  failure   Shellfish Allergy Other (See Comments)    Gout occurs   Betadine [Povidone Iodine ] Itching    Social History:   Social History   Socioeconomic History   Marital status: Single    Spouse name: Not on file   Number of children: 0   Years of education: Not on file   Highest education level: Not on file  Occupational History   Not on file  Tobacco Use   Smoking status: Every Day    Current packs/day: 0.75    Average packs/day: 0.8 packs/day for 55.0 years (41.3 ttl pk-yrs)    Types: Cigarettes   Smokeless tobacco: Never   Tobacco comments:    Smokes 8-9 cigarettes per day  Vaping Use   Vaping status: Never Used  Substance and Sexual Activity   Alcohol use: Not Currently   Drug use: No   Sexual  activity: Not on file  Other Topics Concern   Not on file  Social History Narrative   Patient is single reports no children   Smoker, no current alcohol or drug use   Social Drivers of Corporate investment banker Strain: Low Risk  (11/26/2023)   Overall Financial Resource Strain (CARDIA)    Difficulty of Paying Living Expenses: Not hard at all  Food Insecurity: No Food Insecurity (12/03/2023)   Hunger Vital Sign    Worried About Running Out of Food in the Last Year: Never true    Ran Out of Food in the Last Year: Never true  Transportation Needs: No Transportation Needs (12/03/2023)   PRAPARE - Administrator, Civil Service (Medical): No    Lack of Transportation (Non-Medical): No  Physical Activity: Inactive (11/26/2023)   Exercise Vital Sign    Days of Exercise per Week: 0 days    Minutes of Exercise per Session: 0 min  Stress: No Stress Concern Present (11/26/2023)   [employer] of Occupational Health - Occupational Stress Questionnaire    Feeling of Stress : Not at all  Social Connections: Moderately Isolated (12/03/2023)   Social Connection and Isolation Panel    Frequency of Communication with Friends and Family: More than three times a week     Frequency of Social Gatherings with Friends and Family: Three times a week    Attends Religious Services: More than 4 times per year    Active Member of Clubs or Organizations: No    Attends Banker Meetings: Never    Marital Status: Divorced  Catering manager Violence: Not At Risk (12/03/2023)   Humiliation, Afraid, Rape, and Kick questionnaire    Fear of Current or Ex-Partner: No    Emotionally Abused: No    Physically Abused: No    Sexually Abused: No    Family History:    Family History  Problem Relation Age of Onset   Diabetes Mother    Hypertension Sister    Heart disease Sister    Diabetes Sister    Arthritis Brother    Gout Brother    Colon cancer Neg Hx    Esophageal cancer Neg Hx    Rectal cancer Neg Hx    Stomach cancer Neg Hx    Colon polyps Neg Hx    Inflammatory bowel disease Neg Hx    Liver disease Neg Hx    Pancreatic cancer Neg Hx      ROS:  Please see the history of present illness.   Physical Exam/Data: Vitals:   12/03/23 1100 12/03/23 1145 12/03/23 1200 12/03/23 1542  BP:  (!) 108/49 (!) 109/54 120/66  Pulse: (!) 47 64 (!) 59   Resp:  19 (!) 23 20  Temp:  98.2 F (36.8 C)  98.3 F (36.8 C)  TempSrc:  Oral  Oral  SpO2: 95% 95% 95%   Weight:      Height:        Intake/Output Summary (Last 24 hours) at 12/03/2023 1718 Last data filed at 12/03/2023 1541 Gross per 24 hour  Intake 2210.78 ml  Output 525 ml  Net 1685.78 ml      12/02/2023    5:31 AM 11/26/2023   10:31 AM 11/03/2023   10:58 AM  Last 3 Weights  Weight (lbs) 135 lb 135 lb 138 lb 14.4 oz  Weight (kg) 61.236 kg 61.236 kg 63.005 kg     Body mass index is 18.31 kg/m.  General:  Chronically ill, thin HEENT: normal Neck: no JVD Cardiac:  normal S1, S2; RRR; no murmur  Lungs:  clear to auscultation bilaterally, no wheezing, rhonchi or rales  Abd: soft, nontender, no hepatomegaly  Ext: no edema  Musculoskeletal:  No deformities, BUE and BLE strength normal and  equal Skin: warm and dry  Neuro:  CNs 2-12 intact, no focal abnormalities noted Psych:  Normal affect   Telemetry:  Telemetry was personally reviewed and demonstrates:  Appears to have SR with with 1st HB, RBBB and LAFB Rare PVCs; similar to 11/01/23 EKG (outpatient)  Laboratory Data:  Chemistry Recent Labs  Lab 12/02/23 0539 12/02/23 0846 12/02/23 1301 12/02/23 1420 12/03/23 0446  NA 145   < > 141 140 140  K 2.8*   < > 3.6 3.2* 3.3*  CL 105  --   --  109 110  CO2  --   --   --  22 21*  GLUCOSE 93  --   --  129* 106*  BUN 27*  --   --  24* 23  CREATININE 2.00*  --   --  1.80* 1.86*  CALCIUM   --   --   --  8.0* 7.9*  MG  --   --   --  1.6*  --   GFRNONAA  --   --   --  38* 37*  ANIONGAP  --   --   --  9 9   < > = values in this interval not displayed.     Hematology Recent Labs  Lab 12/02/23 1301 12/02/23 1420 12/03/23 0446  WBC  --  6.2 6.3  RBC  --  3.08* 2.64*  HGB 8.5* 9.1* 7.9*  HCT 25.0* 29.8* 25.4*  MCV  --  96.8 96.2  MCH  --  29.5 29.9  MCHC  --  30.5 31.1  RDW  --  15.9* 16.1*  PLT  --  56* 49*     Radiology/Studies:  PERIPHERAL VASCULAR CATHETERIZATION Result Date: 12/03/2023 See surgical note for result.    Assessment and Plan:  Coronary artery disease Peripheral arterial disease - antiplatelet as per vascular surgery - continue statin - prior PCI no sx currently  Heart failure with reduced ejection fraction - Heart failure with reduced ejection fraction, ejection fraction at 45%. On Entresto  97/103 mg BID. Appears euvolemic on exam. 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 Not on anticoagulation due to high risk  from comorbidities. - stopping BB and monitoring  Bradycardia - Baseline bradycardia with significant bradycardia in 2023 leading to de-escalation of metoprolol . Current pauses about 2.8 seconds. No high-grade AV block observed. Suboptimal candidate for pacemaker due to comorbidities. Compliance with medication is a concern.  No symptoms.  Patient is weak; I had to pick up him up and put him on bedside commode - Stop metoprolol  - Stop amlodipine  - Monitor on telemetry for 5-second pauses or symptomatic pauses  Hypertension Currently normotensive when blood pressure is taken appropriately.  Anemia Anemia with hemoglobin decreased from baseline of 9 to 7.9. No evidence of hematoma. Anemia may be post-surgical. Relative hypotension noted in the setting of anemia. - Order outpatient iron deficiency anemia testing  Will see  tomorrow Planned for Summer EP APP f/u Planned for fall CV APP f/u with my team unless new issues   For questions or updates, please contact [facility] Please consult [email or web address] for contact info under    Gloriann Larger, MD FASE [facility] Cardiologist [facility]  [address] [place], [place] [address] [phone]  5:56 PM

## 2023-12-03 NOTE — Progress Notes (Signed)
 Mobility Specialist Progress Note:   12/03/23 1018  Mobility  Activity Refused mobility   Pt declined mobility at this time d/t lethargy. Will return if time permits. Left with all needs met.   Kendarious Gudino Mobility Specialist Please contact via Special educational needs teacher or  Rehab office at [phone]

## 2023-12-03 NOTE — Plan of Care (Signed)
  Problem: Education: Goal: Knowledge of General Education information will improve Description: Including pain rating scale, medication(s)/side effects and non-pharmacologic comfort measures Outcome: Progressing   Problem: Health Behavior/Discharge Planning: Goal: Ability to manage health-related needs will improve Outcome: Progressing   Problem: Clinical Measurements: Goal: Ability to maintain clinical measurements within normal limits will improve Outcome: Progressing Goal: Will remain free from infection Outcome: Progressing Goal: Diagnostic test results will improve Outcome: Progressing Goal: Respiratory complications will improve Outcome: Progressing Goal: Cardiovascular complication will be avoided Outcome: Progressing   Problem: Activity: Goal: Risk for activity intolerance will decrease Outcome: Progressing   Problem: Nutrition: Goal: Adequate nutrition will be maintained Outcome: Progressing   Problem: Coping: Goal: Level of anxiety will decrease Outcome: Progressing   Problem: Elimination: Goal: Will not experience complications related to bowel motility Outcome: Progressing Problem: Pain Managment: Goal: General experience of comfort will improve and/or be controlled Outcome: Progressing   Problem: Safety: Goal: Ability to remain free from injury will improve Outcome: Progressing   Problem: Skin Integrity: Goal: Risk for impaired skin integrity will decrease Outcome: Progressing   Problem: Education: Goal: Knowledge of discharge needs will improve Outcome: Progressing   Problem: Clinical Measurements: Goal: Postoperative complications will be avoided or minimized Outcome: Progressing   Problem: Respiratory: Goal: Will achieve and/or maintain a regular respiratory rate, without signs or symptoms of dyspnea Outcome: Progressing   Problem: Skin Integrity: Goal: Demonstration of wound healing without infection will improve Outcome: Progressing    Goal: Will not experience complications related to bowel motility Outcome: Progressing

## 2023-12-03 NOTE — Progress Notes (Addendum)
 0800 patient alert x4 on room air able to make needs known chronic SP cath. Patient in AFIB having pauses 2-3 seconds during the night  0900 patient HR noted to be in 40s and occasional 30s MD called, cardiology to see patient. Patient asymptomatic BP WNL held AM BP meds and beta blockers at this time until cardiology sees patient.    1600 refusing mobility all day despite that's what needed for discharge patient states I am not going home tomorrow or any time soon

## 2023-12-03 NOTE — Progress Notes (Signed)
 Mobility Specialist Progress Note:   12/03/23 1446  Mobility  Activity Dangled on edge of bed  Level of Assistance Standby assist, set-up cues, supervision of patient - no hands on  Assistive Device None  Activity Response Tolerated well  Mobility Referral Yes  Mobility visit 1 Mobility  Mobility Specialist Start Time (ACUTE ONLY) 1440  Mobility Specialist Stop Time (ACUTE ONLY) 1446  Mobility Specialist Time Calculation (min) (ACUTE ONLY) 6 min   Attempted x2 to visit with patient. Pt declined mobility this morning despite max encouragement. Followed up this afternoon, pt agreeable to dangle EOB, declined transfer to chair or ambulation despite max encouragement. Pt c/o fatigue. HR in 80's sitting up in bed. Returned pt to supine as pt refused to eat lunch. Lying comfortably in bed with all needs met, call bell in reach, bed alarm on.  Skai Lickteig Mobility Specialist Please contact via Special educational needs teacher or  Rehab office at [phone]

## 2023-12-03 NOTE — Op Note (Addendum)
      Patient name: [name] MRN: [id number]        DOB: 1946/01/01          Sex: male   12/02/2023 Pre-operative Diagnosis: Thoracoabdominal aneurysm Post-operative diagnosis:  Same Surgeon:  Gareld June Co-Surgeon: Delaney Fearing Procedure:   #1: Endovascular repair of a thoracic aneurysm without coverage of the left subclavian artery                       #2: Fenestrated endovascular repair of abdominal aortic aneurysm (4 vessels)                       #3: Stent, celiac artery                       #4: Stent, superior mesenteric artery                       #5: Stent, left renal artery                       #6:  stent, right renal artery                       #7: Bilateral ultrasound-guided common femoral artery cannulation Anesthesia:  General Blood Loss:  150cc Specimens:  none   Findings: Complete exclusion   Devices used: Cook thoracic 38 x 30 x 159 SMA stent: 9 x 29 VBX Celiac stent: 9 x 29 Right renal artery stent: VBX 6 x 29 Left renal artery stent: 6 x 39   Indications: This is a 78 year old gentleman who is status post fenestrated juxtarenal abdominal aortic aneurysm repair on 08/16/2022 for 7.9 cm aneurysm.  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  He comes back in today for four-vessel fenestration.   Procedure:   This is a co-surgeon report, please see Dr. Mick Alamin op note for full operative details.  I assisted throughout the entirety of the case.  I played critical roles in the back table customization as well as pinning wires, tracking devices and cannulated visceral vessels. On the back table, the preselected [facility] thoracic 38 x 30 x 159 device was completely removed from the sheath.  The 4 fenestrations were then marked with a marker using a ruler for accurate measurements.  The celiac was position 45 mm down from the PEG, SMA 69 mm, left renal 107 and the right renal 109 mm.  The eye cautery  was used to burn an approximate 5 mm hole for each of these fenestrations and a platinum coil was then sewn circumferentially around each fenestration with 4-0 Ethibond.  Circumferential 5-0 Prolene sutures were then placed around each of the Z-stents to constrain the graft down to about 18 mm and the graft was then resheathed into the original Cook sheath.  Please see Dr. Mick Alamin operative note for the remainder of the details of the case.[name]   Disposition: To PACU stable.     Philipp Brawn MD [facility] Phone Number: [phone] 12/03/2023 7:21 AM

## 2023-12-03 NOTE — Progress Notes (Addendum)
 Progress Note    12/03/2023 7:17 AM 1 Day Post-Op  Subjective:  no complaints.  Has not had anything to eat since surgery.  Has not been out of bed since surgery.   Vitals:   12/02/23 2338 12/03/23 0332  BP: 115/69 114/66  Pulse: (!) 59 (!) 54  Resp: 20 20  Temp: 98 F (36.7 C) 98.1 F (36.7 C)  SpO2: 98% 97%   Physical Exam: Lungs:  non labored Incisions:  groins without hematoma Extremities:  moving all ext well; DP signals by doppler Abdomen:  soft Neurologic: A&O  CBC    Component Value Date/Time   WBC 6.3 12/03/2023 0446   RBC 2.64 (L) 12/03/2023 0446   HGB 7.9 (L) 12/03/2023 0446   HGB 11.3 (L) 09/23/2022 1032   HGB 13.7 05/06/2017 0926   HCT 25.4 (L) 12/03/2023 0446   HCT 36.4 (L) 09/23/2022 1032   HCT 41.9 05/06/2017 0926   PLT 49 (L) 12/03/2023 0446   PLT 145 (L) 09/23/2022 1032   MCV 96.2 12/03/2023 0446   MCV 88 09/23/2022 1032   MCV 100.7 (H) 05/06/2017 0926   MCH 29.9 12/03/2023 0446   MCHC 31.1 12/03/2023 0446   RDW 16.1 (H) 12/03/2023 0446   RDW 15.3 09/23/2022 1032   RDW 14.5 05/06/2017 0926   LYMPHSABS 0.9 10/29/2023 1924   LYMPHSABS 1.1 09/23/2022 1032   LYMPHSABS 1.0 05/06/2017 0926   MONOABS 0.4 10/29/2023 1924   MONOABS 0.4 05/06/2017 0926   EOSABS 0.1 10/29/2023 1924   EOSABS 0.1 09/23/2022 1032   BASOSABS 0.0 10/29/2023 1924   BASOSABS 0.0 09/23/2022 1032   BASOSABS 0.0 05/06/2017 0926    BMET    Component Value Date/Time   NA 140 12/03/2023 0446   NA 143 06/24/2023 1027   NA 142 05/06/2017 0926   K 3.3 (L) 12/03/2023 0446   K 4.0 05/06/2017 0926   CL 110 12/03/2023 0446   CL 101 01/25/2011 0901   CO2 21 (L) 12/03/2023 0446   CO2 27 05/06/2017 0926   GLUCOSE 106 (H) 12/03/2023 0446   GLUCOSE 108 05/06/2017 0926   GLUCOSE 112 01/25/2011 0901   BUN 23 12/03/2023 0446   BUN 22 06/24/2023 1027   BUN 14.6 05/06/2017 0926   CREATININE 1.86 (H) 12/03/2023 0446   CREATININE 1.1 05/06/2017 0926   CALCIUM  7.9 (L)  12/03/2023 0446   CALCIUM  9.7 05/06/2017 0926   GFRNONAA 37 (L) 12/03/2023 0446   GFRNONAA >89 12/29/2014 0910   GFRAA >60 02/14/2020 1020   GFRAA >89 12/29/2014 0910    INR    Component Value Date/Time   INR 1.4 (H) 12/02/2023 1420     Intake/Output Summary (Last 24 hours) at 12/03/2023 8756 Last data filed at 12/03/2023 0116 Gross per 24 hour  Intake 2850 ml  Output 925 ml  Net 1925 ml     Assessment/Plan:  78 y.o. male is s/p thoracoabdonimal aneurysm repair  1 Day Post-Op   BLE well perfused with DP signals by doppler Cardiac: normotensive; bradycardic but this appears to be his norm; Hgb 7.9 down from 9 Groin incisions are well appearing without hematoma Abd: soft; has not had anything to eat since surgery; regular diet ordered Renal: Cr stable compared to pre-op; urine dark likely related to heparin ; continue to monitor OOB with therapy teams Home when ambulating and tolerating a regular diet   [name] Deters, [name] [facility] [phone] 12/03/2023 7:17 AM  Looks great POD#1, 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  Received 1L fluid yesterday for SBP around 100 CV:  2-3 sec pause with AFIB, will speak with cardiology Needs to get OOB and ambulate Earliest d/c would be tomorrow  [name]

## 2023-12-04 DIAGNOSIS — I441 Atrioventricular block, second degree: Secondary | ICD-10-CM

## 2023-12-04 DIAGNOSIS — I7142 Juxtarenal abdominal aortic aneurysm, without rupture: Secondary | ICD-10-CM

## 2023-12-04 LAB — CBC
HCT: 23.1 % — ABNORMAL LOW (ref 39.0–52.0)
Hemoglobin: 7.1 g/dL — ABNORMAL LOW (ref 13.0–17.0)
MCH: 30 pg (ref 26.0–34.0)
MCHC: 30.7 g/dL (ref 30.0–36.0)
MCV: 97.5 fL (ref 80.0–100.0)
Platelets: 42 10*3/uL — ABNORMAL LOW (ref 150–400)
RBC: 2.37 MIL/uL — ABNORMAL LOW (ref 4.22–5.81)
RDW: 16.3 % — ABNORMAL HIGH (ref 11.5–15.5)
WBC: 7.5 10*3/uL (ref 4.0–10.5)
nRBC: 0 % (ref 0.0–0.2)

## 2023-12-04 LAB — BASIC METABOLIC PANEL WITH GFR
Anion gap: 9 (ref 5–15)
BUN: 26 mg/dL — ABNORMAL HIGH (ref 8–23)
CO2: 21 mmol/L — ABNORMAL LOW (ref 22–32)
Calcium: 8.1 mg/dL — ABNORMAL LOW (ref 8.9–10.3)
Chloride: 111 mmol/L (ref 98–111)
Creatinine, Ser: 1.98 mg/dL — ABNORMAL HIGH (ref 0.61–1.24)
GFR, Estimated: 34 mL/min — ABNORMAL LOW (ref >60)
Glucose, Bld: 120 mg/dL — ABNORMAL HIGH (ref 70–99)
Potassium: 3.5 mmol/L (ref 3.5–5.1)
Sodium: 141 mmol/L (ref 135–145)

## 2023-12-04 MED ORDER — BACITRACIN ZINC 500 UNIT/GM EX OINT
TOPICAL_OINTMENT | Freq: Two times a day (BID) | CUTANEOUS | Status: DC
  Administered 2023-12-04 – 2023-12-08 (×7): 31.5 via TOPICAL
  Filled 2023-12-04: qty 28.4

## 2023-12-04 NOTE — Progress Notes (Addendum)
 Progress Note    12/04/2023 8:07 AM 2 Days Post-Op  Subjective:  no complaints; tolerated diet yesterday   Vitals:   12/04/23 0240 12/04/23 0630  BP: 126/62 130/65  Pulse: 68   Resp: 20 20  Temp: 98.9 F (37.2 C)   SpO2: 93%    Physical Exam: Cardiac: normotensive Lungs:  non labored Incisions:  groins c/d/i Extremities:  DP monophasic by doppler bilaterally Abdomen:  soft Neurologic: A&O  CBC    Component Value Date/Time   WBC 6.3 12/03/2023 0446   RBC 2.64 (L) 12/03/2023 0446   HGB 7.9 (L) 12/03/2023 0446   HGB 11.3 (L) 09/23/2022 1032   HGB 13.7 05/06/2017 0926   HCT 25.4 (L) 12/03/2023 0446   HCT 36.4 (L) 09/23/2022 1032   HCT 41.9 05/06/2017 0926   PLT 49 (L) 12/03/2023 0446   PLT 145 (L) 09/23/2022 1032   MCV 96.2 12/03/2023 0446   MCV 88 09/23/2022 1032   MCV 100.7 (H) 05/06/2017 0926   MCH 29.9 12/03/2023 0446   MCHC 31.1 12/03/2023 0446   RDW 16.1 (H) 12/03/2023 0446   RDW 15.3 09/23/2022 1032   RDW 14.5 05/06/2017 0926   LYMPHSABS 0.9 10/29/2023 1924   LYMPHSABS 1.1 09/23/2022 1032   LYMPHSABS 1.0 05/06/2017 0926   MONOABS 0.4 10/29/2023 1924   MONOABS 0.4 05/06/2017 0926   EOSABS 0.1 10/29/2023 1924   EOSABS 0.1 09/23/2022 1032   BASOSABS 0.0 10/29/2023 1924   BASOSABS 0.0 09/23/2022 1032   BASOSABS 0.0 05/06/2017 0926    BMET    Component Value Date/Time   NA 140 12/03/2023 0446   NA 143 06/24/2023 1027   NA 142 05/06/2017 0926   K 3.3 (L) 12/03/2023 0446   K 4.0 05/06/2017 0926   CL 110 12/03/2023 0446   CL 101 01/25/2011 0901   CO2 21 (L) 12/03/2023 0446   CO2 27 05/06/2017 0926   GLUCOSE 106 (H) 12/03/2023 0446   GLUCOSE 108 05/06/2017 0926   GLUCOSE 112 01/25/2011 0901   BUN 23 12/03/2023 0446   BUN 22 06/24/2023 1027   BUN 14.6 05/06/2017 0926   CREATININE 1.86 (H) 12/03/2023 0446   CREATININE 1.1 05/06/2017 0926   CALCIUM  7.9 (L) 12/03/2023 0446   CALCIUM  9.7 05/06/2017 0926   GFRNONAA 37 (L) 12/03/2023 0446    GFRNONAA >89 12/29/2014 0910   GFRAA >60 02/14/2020 1020   GFRAA >89 12/29/2014 0910    INR    Component Value Date/Time   INR 1.4 (H) 12/02/2023 1420     Intake/Output Summary (Last 24 hours) at 12/04/2023 1610 Last data filed at 12/04/2023 0610 Gross per 24 hour  Intake 1322.16 ml  Output 800 ml  Net 522.16 ml     Assessment/Plan:  78 y.o. male is s/p thoracoabdominal aneurysm repair 2 Days Post-Op   BLE well perfused with monophasic DP signals by doppler CV: normotensive this morning; CBC pending, restart plavix  if H&H stable; appreciate Cardiology recommendations GI: tolerating regular diet Renal: BMP pending; urine dark; unknown output due to patient emptying bag without recording Encouraged OOB today Home when mobility improved and cleared by Cardiology   Cordie Deters, PA-C [facility] [phone] 12/04/2023 8:07 AM  I agree with the above.  I have seen and evaluate the patient.  He will need clearance from physical therapy as he has not been terribly mobile.  I am going to check another CBC and chemistry panel today.  He was complaining about a sore  on his left knee.  We will order antibiotic ointment.  Gareld June

## 2023-12-04 NOTE — Evaluation (Signed)
 Physical Therapy Evaluation Patient Details Name: [name] MRN: [id number] DOB: 1946/06/16 Today's Date: 12/04/2023  History of Present Illness  Patient is a 78 year old male s/p thoracoabdonimal aneurysm repair, bradycardia. History of CAD with prior stents,  A-fib , anemia.  Clinical Impression  Patient is agreeable to PT evaluation. He lives alone and uses a 4 wheeled walker at baseline.  Patient reports pain is well controlled today. He has already ambulated in the hallway once before PT session. Patient is Mod I with bed mobility and supervision for transfers. He walked in the room both with and without rolling walker. Gait pattern and balance is improved using rolling walker which is encouraged for safety and fall prevention. The patient feels he will be able to manage at home and has transportation assistance from his siblings as needed. PT will follow to maximize independence and decrease caregiver burden.       If plan is discharge home, recommend the following: Assist for transportation;Assistance with cooking/housework   Can travel by private vehicle        Equipment Recommendations None recommended by PT  Recommendations for Other Services       Functional Status Assessment Patient has had a recent decline in their functional status and demonstrates the ability to make significant improvements in function in a reasonable and predictable amount of time.     Precautions / Restrictions Precautions Precautions: Fall Recall of Precautions/Restrictions: Intact Restrictions Weight Bearing Restrictions Per Provider Order: No      Mobility  Bed Mobility Overal bed mobility: Modified Independent                  Transfers Overall transfer level: Needs assistance Equipment used: None Transfers: Sit to/from Stand Sit to Stand: Supervision           General transfer comment: no physical assistance required    Ambulation/Gait Ambulation/Gait  assistance: Supervision Gait Distance (Feet): 20 Feet Assistive device: Rolling walker (2 wheels), None Gait Pattern/deviations: Step-through pattern, Decreased stride length, Trunk flexed Gait velocity: decreased     General Gait Details: patient ambulated with and without rolling walker. gait pattern and posture is improved with rolling walker which was encouraged to use for safety  Stairs            Wheelchair Mobility     Tilt Bed    Modified Rankin (Stroke Patients Only)       Balance Overall balance assessment: Needs assistance Sitting-balance support: Feet supported Sitting balance-Leahy Scale: Good     Standing balance support: No upper extremity supported Standing balance-Leahy Scale: Fair                               Pertinent Vitals/Pain Pain Assessment Pain Assessment: No/denies pain    Home Living Family/patient expects to be discharged to:: Private residence Living Arrangements: Alone Available Help at Discharge: Family;Available PRN/intermittently Type of Home: Apartment Home Access: Level entry       Home Layout: One level Home Equipment: Agricultural consultant (2 wheels);Rollator (4 wheels)      Prior Function Prior Level of Function : Independent/Modified Independent             Mobility Comments: using 4 wheeled walker. does not drive ADLs Comments: Mod I     Extremity/Trunk Assessment   Upper Extremity Assessment Upper Extremity Assessment: Overall WFL for tasks assessed    Lower Extremity Assessment Lower Extremity Assessment:  Generalized weakness       Communication   Communication Communication: No apparent difficulties    Cognition Arousal: Alert Behavior During Therapy: WFL for tasks assessed/performed   PT - Cognitive impairments: No apparent impairments                         Following commands: Intact       Cueing Cueing Techniques: Verbal cues     General Comments General  comments (skin integrity, edema, etc.): no dizziness reported with activity. vitals stable throughout session    Exercises     Assessment/Plan    PT Assessment Patient needs continued PT services  PT Problem List Decreased range of motion;Decreased strength;Decreased activity tolerance;Decreased balance;Decreased mobility       PT Treatment Interventions DME instruction;Gait training;Functional mobility training;Therapeutic activities;Therapeutic exercise;Balance training;Neuromuscular re-education;Cognitive remediation;Patient/family education    PT Goals (Current goals can be found in the Care Plan section)  Acute Rehab PT Goals Patient Stated Goal: to go home PT Goal Formulation: With patient Time For Goal Achievement: 12/18/23 Potential to Achieve Goals: Good    Frequency Min 2X/week     Co-evaluation               AM-PAC PT 6 Clicks Mobility  Outcome Measure Help needed turning from your back to your side while in a flat bed without using bedrails?: None Help needed moving from lying on your back to sitting on the side of a flat bed without using bedrails?: A Little Help needed moving to and from a bed to a chair (including a wheelchair)?: A Little Help needed standing up from a chair using your arms (e.g., wheelchair or bedside chair)?: A Little Help needed to walk in hospital room?: A Little Help needed climbing 3-5 steps with a railing? : A Little 6 Click Score: 19    End of Session   Activity Tolerance: Patient tolerated treatment well Patient left: in bed;with call bell/phone within reach;with bed alarm set Nurse Communication: Mobility status PT Visit Diagnosis: Muscle weakness (generalized) (M62.81);Unsteadiness on feet (R26.81)    Time: 5621-3086 PT Time Calculation (min) (ACUTE ONLY): 10 min   Charges:   PT Evaluation $PT Eval Low Complexity: 1 Low   PT General Charges $$ ACUTE PT VISIT: 1 Visit         Ozie Bo, PT,  MPT   Erlene Hawks 12/04/2023, 11:55 AM

## 2023-12-04 NOTE — Progress Notes (Addendum)
 Progress Note  Patient Name: [name] Date of Encounter: 12/04/2023 [facility] Cardiologist: None   Interval Summary   Patient resting comfortably in bed Denies any chest pain, shortness of breath, palpitations, dizziness, lightheadedness, syncope States he has been ambulating without any issues   Vital Signs Vitals:   12/03/23 2318 12/04/23 0240 12/04/23 0630 12/04/23 0814  BP: 117/66 126/62 130/65 (!) 131/55  Pulse: 78 68  66  Resp: 20 20 20 20   Temp: 98.7 F (37.1 C) 98.9 F (37.2 C)  98.3 F (36.8 C)  TempSrc: Oral Oral  Oral  SpO2: 99% 93%  98%  Weight:      Height:        Intake/Output Summary (Last 24 hours) at 12/04/2023 0955 Last data filed at 12/04/2023 1610 Gross per 24 hour  Intake 738.7 ml  Output 750 ml  Net -11.3 ml      12/02/2023    5:31 AM 11/26/2023   10:31 AM 11/03/2023   10:58 AM  Last 3 Weights  Weight (lbs) 135 lb 135 lb 138 lb 14.4 oz  Weight (kg) 61.236 kg 61.236 kg 63.005 kg     Telemetry/ECG  Rate-controlled A. Fib, 2 second pauses noted - Personally Reviewed  Physical Exam  GEN: No acute distress, chronically ill-appearing.   Neck: No JVD Cardiac: irregularly irregular, no murmurs, rubs, or gallops.  Respiratory: Clear to auscultation bilaterally. GI: Soft, nontender, non-distended  MS: No edema  Assessment & Plan  Mr. Hepburn is a 78 year old with coronary artery disease and atrial fibrillation who presents for evaluation of anemia and medication management   Chronic HFrEF Appears euvolemic on exam  Continue Entresto  97-103 mg BID   Bradycardia Paroxysmal atrial fibrillation Tri-fascicular block PVCs Remote history of VT (08/2021) Currently in rate-controlled A. Fib with HR 60-70s Noted first-degree heart block, right bundle branch block, left anterior fascicular block, trifascicular block with PACs and PVCs  HR stable in 60-70s Experienced some ~3 second pauses previously Check telemetry, appears to have  some 1-2 second pauses on telemetry, patient denies any symptoms  Not currently on [facility] with high risk comorbidities  Metoprolol  and amlodipine  were held yesterday  Follow up with be arranged for EP in July and gen cards in September   Hypertension BP stable when blood pressure is taken appropriately  Currently on high dose Entresto    Coronary artery disease Peripheral artery disease Underwent repair of thoracoabdominal aneurysm by vascular on 6/17 Antiplatelet therapy per vascular surgery  Continue Crestor  40 mg daily     For questions or updates, please contact [facility] Please consult [email or web address] for contact info under       Signed, Jiles Mote, PA-C    I have personally seen and examined the patient.  My HPI, Exam, and assessment and plan are below, independent of the NPP above.  No events overnight.  BP improved. Notes he was walking the halls with a walker with no issues.  Eager to go home.  Amenable to re-establishing with cardiology  Exam notable for  Gen: no distress   Cardiac: No Rubs or Gallops, no Murmur, RRR  Respiratory: Clear to auscultation bilaterally, normal effort, normal  respiratory rate GI: Soft, nontender, non-distended No pain around suprapubic cath site MS: No edema;  moves all extremities Integument: Skin feels warm Neuro:  At time of evaluation, alert and oriented to person/place/time/situation  Psych: Normal affect, patient feels well  EKG SBrad with both 2:1 HB and  Wenckebach  Tele: Sinus with Wenckebach and rare PVCs   In assessment and plan:  CAD- asymptomatic off BB, continue current therapy PAD- as per primary HTN- when appropriately checked, normotensive on ARNI monotherapy, encouraged patient not to roll onto cuff when BP is being checked PAF- previously not a candidate for [facility] due to high risk of bleed, anemia worse this admission Bradycardia with tri-fascular block and Wenckebach with < 3 second pauses and no  symptoms; Pending outpatient EP (July) f/u BB stopped General cardiology f/u has been arranged Patient to ambulate to day Reasonable for discharge (no CHB or symptotic HB detected), able to ambulate without reported symptoms or syncope   Gloriann Larger, MD FASE [facility] Cardiologist [facility]  [address], [address] [place], [place] [address] [phone]  11:06 AM

## 2023-12-04 NOTE — Progress Notes (Signed)
 Pt with episode of stool incontinence.  Pt care revealed new oozing and swelling at Right groin site.  Surgeon on call notified, borders marked per verbal instruction, and gauze dressing applied.  Will report thoroughly to night RN

## 2023-12-04 NOTE — Progress Notes (Signed)
 Mobility Specialist Progress Note:    12/04/23 0951  Mobility  Activity Transferred from bed to chair;Ambulated with assistance in room;Ambulated with assistance in hallway  Level of Assistance Standby assist, set-up cues, supervision of patient - no hands on  Assistive Device Front wheel walker  Distance Ambulated (ft) 120 ft  Activity Response Tolerated well  Mobility Referral Yes  Mobility visit 1 Mobility  Mobility Specialist Start Time (ACUTE ONLY) [id number]  Mobility Specialist Stop Time (ACUTE ONLY) 1006  Mobility Specialist Time Calculation (min) (ACUTE ONLY) 15 min   Pt received in bed, agreeable to mobility session after encouragement. Required MinA to stand EOB. CGA with RW while ambulating in hallway. Tolerated well, asx throughout. Returned pt to room, sitting up in chair with all needs met. Linens changed.   Aaryav Hopfensperger Mobility Specialist Please contact via Special educational needs teacher or  Rehab office at [phone]

## 2023-12-05 LAB — CBC
HCT: 21.6 % — ABNORMAL LOW (ref 39.0–52.0)
HCT: 25.6 % — ABNORMAL LOW (ref 39.0–52.0)
Hemoglobin: 6.7 g/dL — CL (ref 13.0–17.0)
Hemoglobin: 7.5 g/dL — ABNORMAL LOW (ref 13.0–17.0)
MCH: 29.6 pg (ref 26.0–34.0)
MCH: 29.8 pg (ref 26.0–34.0)
MCHC: 31 g/dL (ref 30.0–36.0)
MCHC: 29.3 g/dL — ABNORMAL LOW (ref 30.0–36.0)
MCV: 95.6 fL (ref 80.0–100.0)
MCV: 101.6 fL — ABNORMAL HIGH (ref 80.0–100.0)
Platelets: 42 10*3/uL — ABNORMAL LOW (ref 150–400)
Platelets: 47 10*3/uL — ABNORMAL LOW (ref 150–400)
RBC: 2.26 MIL/uL — ABNORMAL LOW (ref 4.22–5.81)
RBC: 2.52 MIL/uL — ABNORMAL LOW (ref 4.22–5.81)
RDW: 16.5 % — ABNORMAL HIGH (ref 11.5–15.5)
RDW: 16.4 % — ABNORMAL HIGH (ref 11.5–15.5)
WBC: 8.5 10*3/uL (ref 4.0–10.5)
WBC: 7.4 10*3/uL (ref 4.0–10.5)
nRBC: 0 % (ref 0.0–0.2)
nRBC: 0.3 % — ABNORMAL HIGH (ref 0.0–0.2)

## 2023-12-05 LAB — BPAM RBC
Blood Product Expiration Date: 202507172359
Blood Product Expiration Date: 202507172359
Unit Type and Rh: 202507172359
Unit Type and Rh: 5100

## 2023-12-05 LAB — BASIC METABOLIC PANEL WITH GFR
Anion gap: 8 (ref 5–15)
BUN: 24 mg/dL — ABNORMAL HIGH (ref 8–23)
CO2: 22 mmol/L (ref 22–32)
Calcium: 8.1 mg/dL — ABNORMAL LOW (ref 8.9–10.3)
Chloride: 110 mmol/L (ref 98–111)
Creatinine, Ser: 1.87 mg/dL — ABNORMAL HIGH (ref 0.61–1.24)
GFR, Estimated: 37 mL/min — ABNORMAL LOW (ref >60)
Glucose, Bld: 100 mg/dL — ABNORMAL HIGH (ref 70–99)
Potassium: 3.1 mmol/L — ABNORMAL LOW (ref 3.5–5.1)
Sodium: 140 mmol/L (ref 135–145)

## 2023-12-05 LAB — HEMOGLOBIN AND HEMATOCRIT, BLOOD
HCT: 24.2 % — ABNORMAL LOW (ref 39.0–52.0)
Hemoglobin: 7.4 g/dL — ABNORMAL LOW (ref 13.0–17.0)

## 2023-12-05 LAB — TYPE AND SCREEN: Unit division: 0

## 2023-12-05 LAB — PREPARE RBC (CROSSMATCH)

## 2023-12-05 MED ORDER — POTASSIUM CHLORIDE CRYS ER 20 MEQ PO TBCR
40.0000 meq | EXTENDED_RELEASE_TABLET | ORAL | Status: AC
  Administered 2023-12-05 (×2): 40 meq via ORAL
  Filled 2023-12-05 (×2): qty 2

## 2023-12-05 MED ORDER — SODIUM CHLORIDE 0.9% IV SOLUTION: Freq: Once | INTRAVENOUS | Status: DC

## 2023-12-05 MED ORDER — ASPIRIN 81 MG PO TBEC
81.0000 mg | DELAYED_RELEASE_TABLET | Freq: Every day | ORAL | Status: DC
  Administered 2023-12-05 – 2023-12-08 (×4): 81 mg via ORAL
  Filled 2023-12-05 (×4): qty 1

## 2023-12-05 NOTE — TOC Initial Note (Signed)
 Transition of Care (TOC) - Initial/Assessment Note  Sherin Dingwall RN, BSN Transitions of Care Unit 4E- RN Case Manager See Treatment Team for direct phone #   Patient Details  Name: [name] MRN: [id number] Date of Birth: 07/26/1945  Transition of Care [facility]) CM/SW Contact:    Rox Cope, RN Phone Number: 12/05/2023, 3:11 PM  Clinical Narrative:                 Cm notified by [facility] liaison that [facility] office made referral for [facility] needs. Order placed for HHPT.   CM in to speak with pt regarding HH needs. Discussed with pt [facility] office referral to [facility], choice offered for [facility] provider- pt voiced that he has used another agency in the past, reviewed list- pt voiced he has used [facility] and would like to see if they can service for [facility] needs, pt agreeable to use [facility] if [facility]  unable to service.   Pt voiced he has needed DME at home, no new DME needs noted at this time. Voiced family will transport home.   Call made to [facility] liaison- Loetta Ringer- referral has been accepted for HHPT need- [facility] will follow up for start of care.   CM updated [facility] liaison that pt preferred [facility] to service.   Expected Discharge Plan: Home w Home Health Services Barriers to Discharge: Continued Medical Work up   Patient Goals and CMS Choice Patient states their goals for this hospitalization and ongoing recovery are:: return home   Choice offered to / list presented to : Patient      Expected Discharge Plan and Services   Discharge Planning Services: CM Consult Post Acute Care Choice: Home Health Living arrangements for the past 2 months: Single Family Home                 DME Arranged: N/A DME Agency: NA       HH Arranged: PT HH Agency: [facility] Date HH Agency Contacted: 12/05/23 Time HH Agency Contacted: 1511 Representative spoke with at [facility] Agency: Loetta Ringer  Prior Living Arrangements/Services Living arrangements for the past 2  months: Single Family Home Lives with:: Adult Children Patient language and need for interpreter reviewed:: Yes Do you feel safe going back to the place where you live?: Yes      Need for Family Participation in Patient Care: Yes (Comment) Care giver support system in place?: Yes (comment) Current home services: DME (walker, cane) Criminal Activity/Legal Involvement Pertinent to Current Situation/Hospitalization: No - Comment as needed  Activities of Daily Living      Permission Sought/Granted Permission sought to share information with : Facility Industrial/product designer granted to share information with : Yes, Verbal Permission Granted     Permission granted to share info w AGENCY: HH        Emotional Assessment Appearance:: Appears stated age Attitude/Demeanor/Rapport: Engaged Affect (typically observed): Accepting Orientation: : Oriented to Self, Oriented to Place, Oriented to  Time, Oriented to Situation Alcohol / Substance Use: Not Applicable Psych Involvement: No (comment)  Admission diagnosis:  AAA (abdominal aortic aneurysm) (HCC) [I71.40] Patient Active Problem List   Diagnosis Date Noted   Chronic heart failure with mildly reduced ejection fraction (HFmrEF, 41-49%) (HCC) 06/02/2023   S/P AAA repair 08/16/2022   Emphysema lung (HCC) 03/05/2022   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   Atrial fibrillation (HCC) 03/05/2021   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   Protein-calorie  malnutrition, severe (HCC) 11/07/2020   Urinary retention 04/02/2018   Mild alcohol use disorder 12/09/2016   Premature atrial contractions 07/31/2015   Chronic use of opiate for therapeutic purpose 05/01/2015   Atherosclerosis of aorta (HCC) 01/13/2015   Prediabetes 12/30/2014   AAA (abdominal aortic aneurysm) (HCC)    Healthcare maintenance 06/30/2012   ASCVD (arteriosclerotic cardiovascular disease) 03/17/2012    Erectile dysfunction 10/12/2010   Arthropathy of pelvic region and thigh 05/02/2008   CKD (chronic kidney disease) stage 2, GFR 60-89 ml/min 08/07/2007   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   Gout 04/18/2006   Tobacco use disorder 04/18/2006   Essential hypertension 04/18/2006   GERD 04/18/2006   PCP:  Priscella Brooms, DO Pharmacy:   [id number] [place], [place] - [address] AT [facility] [address] [place] [address] Phone: [phone] Fax: [phone]  [facility] - [place], [place] - [address] [place] [address] Phone: [phone] Fax: [phone]  [facility] [address] [place] [address] Phone: [phone] Fax: [phone]     Social Drivers of Health (SDOH) Social History: SDOH Screenings   Food Insecurity: No Food Insecurity (12/03/2023)  Housing: Low Risk  (12/03/2023)  Transportation Needs: No Transportation Needs (12/03/2023)  Utilities: Not At Risk (12/03/2023)  Alcohol Screen: Low Risk  (11/26/2023)  Depression (PHQ2-9): Low Risk  (11/26/2023)  Financial Resource Strain: Low Risk  (11/26/2023)  Physical Activity: Inactive (11/26/2023)  Social Connections: Moderately Isolated (12/03/2023)  Stress: No Stress Concern Present (11/26/2023)  Tobacco Use: High Risk (11/26/2023)  Health Literacy: Adequate Health Literacy (11/26/2023)   SDOH Interventions:     Readmission Risk Interventions     No data to display

## 2023-12-05 NOTE — Plan of Care (Signed)
  Problem: Education: Goal: Knowledge of General Education information will improve Description: Including pain rating scale, medication(s)/side effects and non-pharmacologic comfort measures Outcome: Progressing   Problem: Health Behavior/Discharge Planning: Goal: Ability to manage health-related needs will improve Outcome: Progressing   Problem: Clinical Measurements: Goal: Ability to maintain clinical measurements within normal limits will improve Outcome: Progressing Goal: Will remain free from infection Outcome: Progressing Goal: Diagnostic test results will improve Outcome: Progressing Goal: Respiratory complications will improve Outcome: Progressing Goal: Cardiovascular complication will be avoided Outcome: Progressing   Problem: Activity: Goal: Risk for activity intolerance will decrease Outcome: Progressing   Problem: Nutrition: Goal: Adequate nutrition will be maintained Outcome: Progressing   Problem: Coping: Goal: Level of anxiety will decrease Outcome: Progressing   Problem: Elimination: Goal: Will not experience complications related to bowel motility Outcome: Progressing Goal: Will not experience complications related to urinary retention Outcome: Progressing   Problem: Pain Managment: Goal: General experience of comfort will improve and/or be controlled Outcome: Progressing   Problem: Safety: Goal: Ability to remain free from injury will improve Outcome: Progressing   Problem: Skin Integrity: Goal: Risk for impaired skin integrity will decrease Outcome: Progressing   Problem: Education: Goal: Knowledge of discharge needs will improve Outcome: Progressing   Problem: Clinical Measurements: Goal: Postoperative complications will be avoided or minimized Outcome: Progressing   Problem: Respiratory: Goal: Will achieve and/or maintain a regular respiratory rate, without signs or symptoms of dyspnea Outcome: Progressing   Problem: Skin  Integrity: Goal: Demonstration of wound healing without infection will improve Outcome: Progressing

## 2023-12-05 NOTE — Progress Notes (Addendum)
  Progress Note    12/05/2023 7:41 AM 3 Days Post-Op  Subjective:  no complaints   Vitals:   12/05/23 0605 12/05/23 0655  BP: (!) 146/68   Pulse:    Resp: 19 20  Temp: 98.2 F (36.8 C)   SpO2: 100%    Physical Exam: Lungs:  non labored Incisions:  fullness both groins with some sanguinous drainage R groin Extremities:  DP signals BLE Abdomen:  soft, NT, ND Neurologic: A&O  CBC    Component Value Date/Time   WBC 8.5 12/05/2023 0302   RBC 2.26 (L) 12/05/2023 0302   HGB 6.7 (LL) 12/05/2023 0302   HGB 11.3 (L) 09/23/2022 1032   HGB 13.7 05/06/2017 0926   HCT 21.6 (L) 12/05/2023 0302   HCT 36.4 (L) 09/23/2022 1032   HCT 41.9 05/06/2017 0926   PLT 42 (L) 12/05/2023 0302   PLT 145 (L) 09/23/2022 1032   MCV 95.6 12/05/2023 0302   MCV 88 09/23/2022 1032   MCV 100.7 (H) 05/06/2017 0926   MCH 29.6 12/05/2023 0302   MCHC 31.0 12/05/2023 0302   RDW 16.5 (H) 12/05/2023 0302   RDW 15.3 09/23/2022 1032   RDW 14.5 05/06/2017 0926   LYMPHSABS 0.9 10/29/2023 1924   LYMPHSABS 1.1 09/23/2022 1032   LYMPHSABS 1.0 05/06/2017 0926   MONOABS 0.4 10/29/2023 1924   MONOABS 0.4 05/06/2017 0926   EOSABS 0.1 10/29/2023 1924   EOSABS 0.1 09/23/2022 1032   BASOSABS 0.0 10/29/2023 1924   BASOSABS 0.0 09/23/2022 1032   BASOSABS 0.0 05/06/2017 0926    BMET    Component Value Date/Time   NA 140 12/05/2023 0302   NA 143 06/24/2023 1027   NA 142 05/06/2017 0926   K 3.1 (L) 12/05/2023 0302   K 4.0 05/06/2017 0926   CL 110 12/05/2023 0302   CL 101 01/25/2011 0901   CO2 22 12/05/2023 0302   CO2 27 05/06/2017 0926   GLUCOSE 100 (H) 12/05/2023 0302   GLUCOSE 108 05/06/2017 0926   GLUCOSE 112 01/25/2011 0901   BUN 24 (H) 12/05/2023 0302   BUN 22 06/24/2023 1027   BUN 14.6 05/06/2017 0926   CREATININE 1.87 (H) 12/05/2023 0302   CREATININE 1.1 05/06/2017 0926   CALCIUM  8.1 (L) 12/05/2023 0302   CALCIUM  9.7 05/06/2017 0926   GFRNONAA 37 (L) 12/05/2023 0302   GFRNONAA >89 12/29/2014  0910   GFRAA >60 02/14/2020 1020   GFRAA >89 12/29/2014 0910    INR    Component Value Date/Time   INR 1.4 (H) 12/02/2023 1420     Intake/Output Summary (Last 24 hours) at 12/05/2023 0741 Last data filed at 12/05/2023 0605 Gross per 24 hour  Intake 333 ml  Output 850 ml  Net -517 ml     Assessment/Plan:  78 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 Transfuse today. Will initiate plavix  prior to dc.   Clatie Kessen C. Vikki Graves, MD [facility] [place] Office: [phone] Pager: [phone]

## 2023-12-05 NOTE — Progress Notes (Signed)
 Critical Hgb of 6.7 (down from 7.1 yesterday). Dr. Edgardo Goodwill paged and 1 unit PRBCs ordered.

## 2023-12-06 LAB — BASIC METABOLIC PANEL WITH GFR
Anion gap: 5 (ref 5–15)
BUN: 22 mg/dL (ref 8–23)
CO2: 22 mmol/L (ref 22–32)
Calcium: 8.2 mg/dL — ABNORMAL LOW (ref 8.9–10.3)
Chloride: 112 mmol/L — ABNORMAL HIGH (ref 98–111)
Creatinine, Ser: 1.78 mg/dL — ABNORMAL HIGH (ref 0.61–1.24)
GFR, Estimated: 39 mL/min — ABNORMAL LOW (ref >60)
Glucose, Bld: 95 mg/dL (ref 70–99)
Potassium: 4.2 mmol/L (ref 3.5–5.1)
Sodium: 139 mmol/L (ref 135–145)

## 2023-12-06 LAB — CBC
HCT: 22.6 % — ABNORMAL LOW (ref 39.0–52.0)
Hemoglobin: 7 g/dL — ABNORMAL LOW (ref 13.0–17.0)
MCH: 29.7 pg (ref 26.0–34.0)
MCHC: 31 g/dL (ref 30.0–36.0)
MCV: 95.8 fL (ref 80.0–100.0)
Platelets: 41 10*3/uL — ABNORMAL LOW (ref 150–400)
RBC: 2.36 MIL/uL — ABNORMAL LOW (ref 4.22–5.81)
RDW: 16.4 % — ABNORMAL HIGH (ref 11.5–15.5)
WBC: 6.8 10*3/uL (ref 4.0–10.5)
nRBC: 0 % (ref 0.0–0.2)

## 2023-12-06 LAB — BPAM RBC: ISSUE DATE / TIME: 202506200422

## 2023-12-06 LAB — HEMOGLOBIN AND HEMATOCRIT, BLOOD
HCT: 22.6 % — ABNORMAL LOW (ref 39.0–52.0)
Hemoglobin: 7.1 g/dL — ABNORMAL LOW (ref 13.0–17.0)

## 2023-12-06 LAB — TYPE AND SCREEN
ABO/RH(D): O POS
Antibody Screen: NEGATIVE
Unit division: 0

## 2023-12-06 LAB — PREPARE RBC (CROSSMATCH)

## 2023-12-06 MED ORDER — SODIUM CHLORIDE 0.9% IV SOLUTION: Freq: Once | INTRAVENOUS | Status: AC

## 2023-12-06 NOTE — Plan of Care (Signed)
  Problem: Education: Goal: Knowledge of General Education information will improve Description: Including pain rating scale, medication(s)/side effects and non-pharmacologic comfort measures Outcome: Progressing   Problem: Health Behavior/Discharge Planning: Goal: Ability to manage health-related needs will improve Outcome: Progressing   Problem: Clinical Measurements: Goal: Ability to maintain clinical measurements within normal limits will improve Outcome: Progressing Goal: Will remain free from infection Outcome: Progressing Goal: Diagnostic test results will improve Outcome: Progressing Goal: Respiratory complications will improve Outcome: Progressing Goal: Cardiovascular complication will be avoided Outcome: Progressing   Problem: Activity: Goal: Risk for activity intolerance will decrease Outcome: Progressing   Problem: Nutrition: Goal: Adequate nutrition will be maintained Outcome: Progressing   Problem: Coping: Goal: Level of anxiety will decrease Outcome: Progressing   Problem: Elimination: Goal: Will not experience complications related to bowel motility Outcome: Progressing Goal: Will not experience complications related to urinary retention Outcome: Progressing   Problem: Pain Managment: Goal: General experience of comfort will improve and/or be controlled Outcome: Progressing   Problem: Safety: Goal: Ability to remain free from injury will improve Outcome: Progressing   Problem: Skin Integrity: Goal: Risk for impaired skin integrity will decrease Outcome: Progressing   Problem: Education: Goal: Knowledge of discharge needs will improve Outcome: Progressing   Problem: Clinical Measurements: Goal: Postoperative complications will be avoided or minimized Outcome: Progressing   Problem: Respiratory: Goal: Will achieve and/or maintain a regular respiratory rate, without signs or symptoms of dyspnea Outcome: Progressing   Problem: Skin  Integrity: Goal: Demonstration of wound healing without infection will improve Outcome: Progressing

## 2023-12-06 NOTE — Progress Notes (Addendum)
 Progress Note    12/06/2023 8:41 AM 4 Days Post-Op  Subjective:  no complaints   Vitals:   12/05/23 2332 12/06/23 0400  BP: 134/73 (!) 142/83  Pulse: (!) 58 63  Resp: 20 20  Temp: 98.4 F (36.9 C) 98.1 F (36.7 C)  SpO2: 99% 98%   Physical Exam: Lungs:  non labored Incisions:  fullness left groin but soft; slow sanguinous drainage R groin, no hematoma Extremities:  DP signals BLE Abdomen:  soft, NT, ND Neurologic: A&O  CBC    Component Value Date/Time   WBC 6.8 12/06/2023 0352   RBC 2.36 (L) 12/06/2023 0352   HGB 7.0 (L) 12/06/2023 0352   HGB 11.3 (L) 09/23/2022 1032   HGB 13.7 05/06/2017 0926   HCT 22.6 (L) 12/06/2023 0352   HCT 36.4 (L) 09/23/2022 1032   HCT 41.9 05/06/2017 0926   PLT 41 (L) 12/06/2023 0352   PLT 145 (L) 09/23/2022 1032   MCV 95.8 12/06/2023 0352   MCV 88 09/23/2022 1032   MCV 100.7 (H) 05/06/2017 0926   MCH 29.7 12/06/2023 0352   MCHC 31.0 12/06/2023 0352   RDW 16.4 (H) 12/06/2023 0352   RDW 15.3 09/23/2022 1032   RDW 14.5 05/06/2017 0926   LYMPHSABS 0.9 10/29/2023 1924   LYMPHSABS 1.1 09/23/2022 1032   LYMPHSABS 1.0 05/06/2017 0926   MONOABS 0.4 10/29/2023 1924   MONOABS 0.4 05/06/2017 0926   EOSABS 0.1 10/29/2023 1924   EOSABS 0.1 09/23/2022 1032   BASOSABS 0.0 10/29/2023 1924   BASOSABS 0.0 09/23/2022 1032   BASOSABS 0.0 05/06/2017 0926    BMET    Component Value Date/Time   NA 139 12/06/2023 0352   NA 143 06/24/2023 1027   NA 142 05/06/2017 0926   K 4.2 12/06/2023 0352   K 4.0 05/06/2017 0926   CL 112 (H) 12/06/2023 0352   CL 101 01/25/2011 0901   CO2 22 12/06/2023 0352   CO2 27 05/06/2017 0926   GLUCOSE 95 12/06/2023 0352   GLUCOSE 108 05/06/2017 0926   GLUCOSE 112 01/25/2011 0901   BUN 22 12/06/2023 0352   BUN 22 06/24/2023 1027   BUN 14.6 05/06/2017 0926   CREATININE 1.78 (H) 12/06/2023 0352   CREATININE 1.1 05/06/2017 0926   CALCIUM  8.2 (L) 12/06/2023 0352   CALCIUM  9.7 05/06/2017 0926   GFRNONAA 39 (L)  12/06/2023 0352   GFRNONAA >89 12/29/2014 0910   GFRAA >60 02/14/2020 1020   GFRAA >89 12/29/2014 0910    INR    Component Value Date/Time   INR 1.4 (H) 12/02/2023 1420    No intake or output data in the 24 hours ending 12/06/23 0841   Assessment/Plan:  78 y.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  Right groin bruising appears to be secondary to small venous or arterial skin bleeder.  Pressure was held for additional 10 minutes and Dermabond applied which will hopefully control the bleeding.  If there is ongoing bleeding I have recommended nursing to hold an additional 10 to 20 minutes of pressure.  Nadene Witherspoon C. Sheree, MD [facility] [place] Office: [phone] Pager: [phone]

## 2023-12-06 NOTE — Progress Notes (Signed)
 Patient noted to have slow, small amount of bloody drainage from R groin site.  Dressing changed x2 on day shift according to report.  Dressing dry and intact until 0300 when found to be saturated again.  Denies pain at groin site.  Dressing change and pressure held.

## 2023-12-07 LAB — CBC
HCT: 20.1 % — ABNORMAL LOW (ref 39.0–52.0)
Hemoglobin: 6.5 g/dL — CL (ref 13.0–17.0)
MCH: 30.1 pg (ref 26.0–34.0)
MCHC: 32.3 g/dL (ref 30.0–36.0)
MCV: 93.1 fL (ref 80.0–100.0)
Platelets: 41 10*3/uL — ABNORMAL LOW (ref 150–400)
RBC: 2.16 MIL/uL — ABNORMAL LOW (ref 4.22–5.81)
RDW: 17.7 % — ABNORMAL HIGH (ref 11.5–15.5)
WBC: 6.8 10*3/uL (ref 4.0–10.5)
nRBC: 0.3 % — ABNORMAL HIGH (ref 0.0–0.2)

## 2023-12-07 LAB — BASIC METABOLIC PANEL WITH GFR
Anion gap: 4 — ABNORMAL LOW (ref 5–15)
BUN: 22 mg/dL (ref 8–23)
CO2: 20 mmol/L — ABNORMAL LOW (ref 22–32)
Calcium: 7.7 mg/dL — ABNORMAL LOW (ref 8.9–10.3)
Chloride: 112 mmol/L — ABNORMAL HIGH (ref 98–111)
Creatinine, Ser: 1.72 mg/dL — ABNORMAL HIGH (ref 0.61–1.24)
GFR, Estimated: 40 mL/min — ABNORMAL LOW (ref >60)
Glucose, Bld: 94 mg/dL (ref 70–99)
Potassium: 4.1 mmol/L (ref 3.5–5.1)
Sodium: 136 mmol/L (ref 135–145)

## 2023-12-07 LAB — HEMOGLOBIN AND HEMATOCRIT, BLOOD
HCT: 24.6 % — ABNORMAL LOW (ref 39.0–52.0)
Hemoglobin: 8 g/dL — ABNORMAL LOW (ref 13.0–17.0)

## 2023-12-07 LAB — PREPARE RBC (CROSSMATCH)

## 2023-12-07 MED ORDER — SODIUM CHLORIDE 0.9% IV SOLUTION: Freq: Once | INTRAVENOUS | Status: AC

## 2023-12-07 NOTE — Progress Notes (Addendum)
 Progress Note    12/07/2023 8:54 AM 5 Days Post-Op  Subjective:  no complaints   Vitals:   12/07/23 0300 12/07/23 0842  BP: 128/66 123/71  Pulse: 70 64  Resp: 19 20  Temp: 98.5 F (36.9 C) 98.1 F (36.7 C)  SpO2: 99% 100%   Physical Exam: Lungs:  non labored Incisions:  R groin without bleeding, small hematoma; L groin fullness but soft Extremities:  feet warm with DP by doppler Neurologic: A&O  CBC    Component Value Date/Time   WBC 6.8 12/07/2023 0346   RBC 2.16 (L) 12/07/2023 0346   HGB 6.5 (LL) 12/07/2023 0346   HGB 11.3 (L) 09/23/2022 1032   HGB 13.7 05/06/2017 0926   HCT 20.1 (L) 12/07/2023 0346   HCT 36.4 (L) 09/23/2022 1032   HCT 41.9 05/06/2017 0926   PLT 41 (L) 12/07/2023 0346   PLT 145 (L) 09/23/2022 1032   MCV 93.1 12/07/2023 0346   MCV 88 09/23/2022 1032   MCV 100.7 (H) 05/06/2017 0926   MCH 30.1 12/07/2023 0346   MCHC 32.3 12/07/2023 0346   RDW 17.7 (H) 12/07/2023 0346   RDW 15.3 09/23/2022 1032   RDW 14.5 05/06/2017 0926   LYMPHSABS 0.9 10/29/2023 1924   LYMPHSABS 1.1 09/23/2022 1032   LYMPHSABS 1.0 05/06/2017 0926   MONOABS 0.4 10/29/2023 1924   MONOABS 0.4 05/06/2017 0926   EOSABS 0.1 10/29/2023 1924   EOSABS 0.1 09/23/2022 1032   BASOSABS 0.0 10/29/2023 1924   BASOSABS 0.0 09/23/2022 1032   BASOSABS 0.0 05/06/2017 0926    BMET    Component Value Date/Time   NA 136 12/07/2023 0346   NA 143 06/24/2023 1027   NA 142 05/06/2017 0926   K 4.1 12/07/2023 0346   K 4.0 05/06/2017 0926   CL 112 (H) 12/07/2023 0346   CL 101 01/25/2011 0901   CO2 20 (L) 12/07/2023 0346   CO2 27 05/06/2017 0926   GLUCOSE 94 12/07/2023 0346   GLUCOSE 108 05/06/2017 0926   GLUCOSE 112 01/25/2011 0901   BUN 22 12/07/2023 0346   BUN 22 06/24/2023 1027   BUN 14.6 05/06/2017 0926   CREATININE 1.72 (H) 12/07/2023 0346   CREATININE 1.1 05/06/2017 0926   CALCIUM  7.7 (L) 12/07/2023 0346   CALCIUM  9.7 05/06/2017 0926   GFRNONAA 40 (L) 12/07/2023 0346    GFRNONAA >89 12/29/2014 0910   GFRAA >60 02/14/2020 1020   GFRAA >89 12/29/2014 0910    INR    Component Value Date/Time   INR 1.4 (H) 12/02/2023 1420     Intake/Output Summary (Last 24 hours) at 12/07/2023 0854 Last data filed at 12/06/2023 2319 Gross per 24 hour  Intake 431 ml  Output 500 ml  Net -69 ml     Assessment/Plan:  78 y.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  Patient now has a small hematoma in the right groin although no further bleeding after placing Dermabond yesterday and hematoma is not expanding.  Will transfuse additional 2 units of packed red blood cells and hopefully can discharge in  the coming days.  Shandon Matson C. Sheree, MD [facility] [place] Office: [phone] Pager: [phone]

## 2023-12-08 ENCOUNTER — Other Ambulatory Visit: Payer: Self-pay

## 2023-12-08 DIAGNOSIS — I714 Abdominal aortic aneurysm, without rupture, unspecified: Secondary | ICD-10-CM

## 2023-12-08 DIAGNOSIS — Z9889 Other specified postprocedural states: Secondary | ICD-10-CM

## 2023-12-08 LAB — TYPE AND SCREEN
ABO/RH(D): O POS
Antibody Screen: NEGATIVE
Unit division: 0
Unit division: 0
Unit division: 0
Unit division: 0

## 2023-12-08 LAB — CBC
HCT: 25.7 % — ABNORMAL LOW (ref 39.0–52.0)
Hemoglobin: 8.3 g/dL — ABNORMAL LOW (ref 13.0–17.0)
MCH: 29.9 pg (ref 26.0–34.0)
MCHC: 32.3 g/dL (ref 30.0–36.0)
MCV: 92.4 fL (ref 80.0–100.0)
Platelets: 51 10*3/uL — ABNORMAL LOW (ref 150–400)
RBC: 2.78 MIL/uL — ABNORMAL LOW (ref 4.22–5.81)
RDW: 17.1 % — ABNORMAL HIGH (ref 11.5–15.5)
WBC: 6.6 10*3/uL (ref 4.0–10.5)
nRBC: 0 % (ref 0.0–0.2)

## 2023-12-08 LAB — BPAM RBC
Blood Product Expiration Date: 202507092359
Blood Product Expiration Date: 202507142359
ISSUE DATE / TIME: 202506211217
ISSUE DATE / TIME: 202506221027
ISSUE DATE / TIME: 202506221413
ISSUE DATE / TIME: 202507172359
Unit Type and Rh: 202507142359
Unit Type and Rh: 202507172359
Unit Type and Rh: 5100
Unit Type and Rh: 5100
Unit Type and Rh: 9500

## 2023-12-08 LAB — BASIC METABOLIC PANEL WITH GFR
Anion gap: 5 (ref 5–15)
BUN: 24 mg/dL — ABNORMAL HIGH (ref 8–23)
CO2: 21 mmol/L — ABNORMAL LOW (ref 22–32)
Calcium: 7.8 mg/dL — ABNORMAL LOW (ref 8.9–10.3)
Chloride: 112 mmol/L — ABNORMAL HIGH (ref 98–111)
Creatinine, Ser: 1.84 mg/dL — ABNORMAL HIGH (ref 0.61–1.24)
GFR, Estimated: 37 mL/min — ABNORMAL LOW (ref >60)
Glucose, Bld: 103 mg/dL — ABNORMAL HIGH (ref 70–99)
Potassium: 4.1 mmol/L (ref 3.5–5.1)
Sodium: 138 mmol/L (ref 135–145)

## 2023-12-08 MED ORDER — CLOPIDOGREL BISULFATE 75 MG PO TABS: 75.0000 mg | ORAL_TABLET | Freq: Every day | ORAL | 1 refills | Status: AC

## 2023-12-08 NOTE — Care Management Important Message (Signed)
 Important Message  Patient Details  Name: [name] MRN: [id number] Date of Birth: 11/15/1945   Important Message Given:        Vonzell Arrie Sharps 12/08/2023, 11:28 AM

## 2023-12-08 NOTE — Progress Notes (Signed)
  Progress Note    12/08/2023 7:58 AM 6 Days Post-Op  Subjective:  no complaints.  Wants to go home    Vitals:   12/08/23 0310 12/08/23 0755  BP: 123/66 113/67  Pulse: 73 84  Resp: 20 20  Temp: 99.2 F (37.3 C) 98 F (36.7 C)  SpO2: 95% 97%   Physical Exam: Lungs:  non labored Incisions:  L groin fullness but soft; R groin small hematoma stable compared to yesterday; scant drainage on gauze Extremities:  monophasic DP signals BLE Abdomen:  soft Neurologic: A&O  CBC    Component Value Date/Time   WBC 6.6 12/08/2023 0359   RBC 2.78 (L) 12/08/2023 0359   HGB 8.3 (L) 12/08/2023 0359   HGB 11.3 (L) 09/23/2022 1032   HGB 13.7 05/06/2017 0926   HCT 25.7 (L) 12/08/2023 0359   HCT 36.4 (L) 09/23/2022 1032   HCT 41.9 05/06/2017 0926   PLT 51 (L) 12/08/2023 0359   PLT 145 (L) 09/23/2022 1032   MCV 92.4 12/08/2023 0359   MCV 88 09/23/2022 1032   MCV 100.7 (H) 05/06/2017 0926   MCH 29.9 12/08/2023 0359   MCHC 32.3 12/08/2023 0359   RDW 17.1 (H) 12/08/2023 0359   RDW 15.3 09/23/2022 1032   RDW 14.5 05/06/2017 0926   LYMPHSABS 0.9 10/29/2023 1924   LYMPHSABS 1.1 09/23/2022 1032   LYMPHSABS 1.0 05/06/2017 0926   MONOABS 0.4 10/29/2023 1924   MONOABS 0.4 05/06/2017 0926   EOSABS 0.1 10/29/2023 1924   EOSABS 0.1 09/23/2022 1032   BASOSABS 0.0 10/29/2023 1924   BASOSABS 0.0 09/23/2022 1032   BASOSABS 0.0 05/06/2017 0926    BMET    Component Value Date/Time   NA 138 12/08/2023 0359   NA 143 06/24/2023 1027   NA 142 05/06/2017 0926   K 4.1 12/08/2023 0359   K 4.0 05/06/2017 0926   CL 112 (H) 12/08/2023 0359   CL 101 01/25/2011 0901   CO2 21 (L) 12/08/2023 0359   CO2 27 05/06/2017 0926   GLUCOSE 103 (H) 12/08/2023 0359   GLUCOSE 108 05/06/2017 0926   GLUCOSE 112 01/25/2011 0901   BUN 24 (H) 12/08/2023 0359   BUN 22 06/24/2023 1027   BUN 14.6 05/06/2017 0926   CREATININE 1.84 (H) 12/08/2023 0359   CREATININE 1.1 05/06/2017 0926   CALCIUM  7.8 (L) 12/08/2023  0359   CALCIUM  9.7 05/06/2017 0926   GFRNONAA 37 (L) 12/08/2023 0359   GFRNONAA >89 12/29/2014 0910   GFRAA >60 02/14/2020 1020   GFRAA >89 12/29/2014 0910    INR    Component Value Date/Time   INR 1.4 (H) 12/02/2023 1420     Intake/Output Summary (Last 24 hours) at 12/08/2023 0758 Last data filed at 12/08/2023 0004 Gross per 24 hour  Intake 824 ml  Output 800 ml  Net 24 ml     Assessment/Plan:  78 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

## 2023-12-08 NOTE — Progress Notes (Signed)
 Physical Therapy Treatment Patient Details Name: [name] MRN: [id number] DOB: Aug 09, 1945 Today's Date: 12/08/2023   History of Present Illness Patient is a 78 year old male s/p thoracoabdonimal aneurysm repair, bradycardia. History of CAD with prior stents,  A-fib , anemia.    PT Comments  Pt received in supine, sleeping upon initial attempt at 12:14, but A&O when re-attempted at 12:35, agreeable to therapy session with emphasis on seated/standing balance while preparing for DC including dressing. Pt needing up to Supervision with min safety cues for transfers and given assist for lower body dressing set-up but pt mostly able to don clothing once sitting EOB without assist needed for seated balance. RN notified pt may need help to place his catheter holster on his thigh, he defers during session due to wanting to eat his lunch. Pt continues to benefit from PT services to progress toward functional mobility goals, continue to recommend HHPT.   If plan is discharge home, recommend the following: Assist for transportation;Assistance with cooking/housework   Can travel by private vehicle        Equipment Recommendations  None recommended by PT    Recommendations for Other Services       Precautions / Restrictions Precautions Precautions: Fall Recall of Precautions/Restrictions: Intact Precaution/Restrictions Comments: Contact precs; suprapubic catheter Restrictions Weight Bearing Restrictions Per Provider Order: No     Mobility  Bed Mobility Overal bed mobility: Modified Independent                  Transfers Overall transfer level: Needs assistance Equipment used: Rolling walker (2 wheels) Transfers: Sit to/from Stand Sit to Stand: Supervision           General transfer comment: no physical assistance required, RW placed in front of him for support PRN while pt pulling up his pants. Pt able to secure his shoes fully sitting EOB after PTA got them partially on  his feet  prior to standing trial.    Ambulation/Gait               General Gait Details: pt defers due to arrival of his lunch and wanting to sit EOB to eat prior to DC   Stairs             Wheelchair Mobility     Tilt Bed    Modified Rankin (Stroke Patients Only)       Balance Overall balance assessment: Needs assistance Sitting-balance support: Feet supported Sitting balance-Leahy Scale: Good Sitting balance - Comments: sitting EOB to eat his lunch   Standing balance support: No upper extremity supported Standing balance-Leahy Scale: Fair Standing balance comment: static standing while adjusting clothing; pt defers dynamic standing tasks/gait                            Communication Communication Communication: No apparent difficulties  Cognition Arousal: Alert Behavior During Therapy: WFL for tasks assessed/performed, Flat affect   PT - Cognitive impairments: No apparent impairments                       PT - Cognition Comments: pt agreeable to PT session but requesting to focus on preparation for DC such as getting OOB and dressing, pt instructed on safety during process and then food tray arriving, PTA encouraging him to transfer OOB to chair to eat but pt defers and wanting to sit EOB; limited session but pt appears A&O and cooperative within  his stated goals. Following commands: Intact      Cueing Cueing Techniques: Verbal cues  Exercises      General Comments General comments (skin integrity, edema, etc.): pt suprapubic catheter bag pinned to paper scrub top prior to standing trial, pt requesting thigh holster be placed but it wasn't located at first, once PTA found it pt already beginning to eat lunch and is agreeable to nursing staff placing it on him once he is done eating and ready to go. RN aware pt has not yet called family for a ride home at end of session. SpO2/BP WFL on RA prior to removal of heart monitor and O2  monitors, BP 117/61 (76) supine prior to OOB.      Pertinent Vitals/Pain Pain Assessment Pain Assessment: Faces Faces Pain Scale: No hurt Pain Descriptors / Indicators: Guarding Pain Intervention(s): Monitored during session, Repositioned    Home Living                          Prior Function            PT Goals (current goals can now be found in the care plan section) Acute Rehab PT Goals Patient Stated Goal: to go home PT Goal Formulation: With patient Time For Goal Achievement: 12/18/23 Progress towards PT goals: Progressing toward goals    Frequency    Min 2X/week      PT Plan      Co-evaluation              AM-PAC PT 6 Clicks Mobility   Outcome Measure  Help needed turning from your back to your side while in a flat bed without using bedrails?: None Help needed moving from lying on your back to sitting on the side of a flat bed without using bedrails?: A Little Help needed moving to and from a bed to a chair (including a wheelchair)?: A Little Help needed standing up from a chair using your arms (e.g., wheelchair or bedside chair)?: A Little Help needed to walk in hospital room?: A Little Help needed climbing 3-5 steps with a railing? : A Little 6 Click Score: 19    End of Session   Activity Tolerance: Patient tolerated treatment well;Other (comment) (session time limited due to pt upcoming DC and arrival of his lunch, pt defers amb) Patient left: in bed;with call bell/phone within reach;Other (comment) (pt sitting EOB to eat lunch, dressed and ready for DC once RN removes his IV and he calls his ride) Nurse Communication: Mobility status;Other (comment) (pt wants help placing his thigh holster/drain bag) PT Visit Diagnosis: Muscle weakness (generalized) (M62.81);Unsteadiness on feet (R26.81)     Time: 8764-8747 PT Time Calculation (min) (ACUTE ONLY): 17 min  Charges:    $Therapeutic Activity: 8-22 mins PT General Charges $$ ACUTE  PT VISIT: 1 Visit                     Edinson Domeier P., PTA Acute Rehabilitation Services Secure Chat Preferred 9a-5:30pm Office: [phone]    Connell HERO [facility] 12/08/2023, 4:35 PM

## 2023-12-08 NOTE — Discharge Instructions (Signed)
   [facility]   Discharge Instructions  Endovascular Aortic Aneurysm Repair  Please refer to the following instructions for your post-procedure care. Your surgeon or Physician Assistant will discuss any changes with you.  Activity  You are encouraged to walk as much as you can. You can slowly return to normal activities but must avoid strenuous activity and heavy lifting until your doctor tells you it's OK. Avoid activities such as vacuuming or swinging a gold club. It is normal to feel tired for several weeks after your surgery. Do not drive until your doctor gives the OK and you are no longer taking prescription pain medications. It is also normal to have difficulty with sleep habits, eating, and bowel movements after surgery. These will go away with time.  Bathing/Showering  You may shower after you go home. If you have an incision, do not soak in a bathtub, hot tub, or swim until the incision heals completely.  Incision Care  Shower every day. Clean your incision with mild soap and water. Pat the area dry with a clean towel. You do not need a bandage unless otherwise instructed. Do not apply any ointments or creams to your incision. If you clothing is irritating, you may cover your incision with a dry gauze pad.  Diet  Resume your normal diet. There are no special food restrictions following this procedure. A low fat/low cholesterol diet is recommended for all patients with vascular disease. In order to heal from your surgery, it is CRITICAL to get adequate nutrition. Your body requires vitamins, minerals, and protein. Vegetables are the best source of vitamins and minerals. Vegetables also provide the perfect balance of protein. Processed food has little nutritional value, so try to avoid this.  Medications  Resume taking all of your medications unless your doctor or nurse practitioner tells you not to. If your incision is causing pain, you may take  over-the-counter pain relievers such as acetaminophen (Tylenol). If you were prescribed a stronger pain medication, please be aware these medications can cause nausea and constipation. Prevent nausea by taking the medication with a snack or meal. Avoid constipation by drinking plenty of fluids and eating foods with a high amount of fiber, such as fruits, vegetables, and grains. Do not take Tylenol if you are taking prescription pain medications.   Follow up  [facility] office will schedule a follow-up appointment with a C.T. scan 3-4 weeks after your surgery.  Please call us immediately for any of the following conditions  Severe or worsening pain in your legs or feet or in your abdomen back or chest. Increased pain, redness, drainage (pus) from your incision sit. Increased abdominal pain, bloating, nausea, vomiting or persistent diarrhea. Fever of 101 degrees or higher. Swelling in your leg (s),  Reduce your risk of vascular disease  Stop smoking. If you would like help call [facility] at 1-800-QUIT-NOW [phone]) or [place] at [phone]. Manage your cholesterol Maintain a desired weight Control your diabetes Keep your blood pressure down  If you have questions, please call the office at [phone].

## 2023-12-08 NOTE — Progress Notes (Addendum)
 Mobility Specialist Progress Note:   12/08/23 0952  Mobility  Activity Ambulated with assistance in room;Ambulated with assistance in hallway;Transferred from bed to chair  Level of Assistance Moderate assist, patient does 50-74%  Assistive Device Front wheel walker  Distance Ambulated (ft) 200 ft  Activity Response Tolerated well  Mobility Referral Yes  Mobility visit 1 Mobility  Mobility Specialist Start Time (ACUTE ONLY) [id number]  Mobility Specialist Stop Time (ACUTE ONLY) 1005  Mobility Specialist Time Calculation (min) (ACUTE ONLY) 13 min   Pt received in bed, agreeable to mobility session. ModA to stand, CGA during ambulation in hallway with RW. Tolerated well, HR in 90s. Returned pt to room, sitting up in chair, all needs met, call bell in reach. Post Mobility 129/63 (82).  Amandine Covino Mobility Specialist Please contact via Special educational needs teacher or  Rehab office at [phone]

## 2023-12-08 NOTE — Plan of Care (Signed)
  Problem: Education: Goal: Knowledge of General Education information will improve Description: Including pain rating scale, medication(s)/side effects and non-pharmacologic comfort measures Outcome: Progressing   Problem: Health Behavior/Discharge Planning: Goal: Ability to manage health-related needs will improve Outcome: Progressing   Problem: Clinical Measurements: Goal: Ability to maintain clinical measurements within normal limits will improve Outcome: Progressing Goal: Will remain free from infection Outcome: Progressing Goal: Diagnostic test results will improve Outcome: Progressing Goal: Respiratory complications will improve Outcome: Progressing Goal: Cardiovascular complication will be avoided Outcome: Progressing   Problem: Activity: Goal: Risk for activity intolerance will decrease Outcome: Progressing   Problem: Nutrition: Goal: Adequate nutrition will be maintained Outcome: Progressing   Problem: Coping: Goal: Level of anxiety will decrease Outcome: Progressing   Problem: Elimination: Goal: Will not experience complications related to bowel motility Outcome: Progressing Goal: Will not experience complications related to urinary retention Outcome: Progressing   Problem: Pain Managment: Goal: General experience of comfort will improve and/or be controlled Outcome: Progressing   Problem: Safety: Goal: Ability to remain free from injury will improve Outcome: Progressing   Problem: Skin Integrity: Goal: Risk for impaired skin integrity will decrease Outcome: Progressing   Problem: Education: Goal: Knowledge of discharge needs will improve Outcome: Progressing   Problem: Clinical Measurements: Goal: Postoperative complications will be avoided or minimized Outcome: Progressing   Problem: Respiratory: Goal: Will achieve and/or maintain a regular respiratory rate, without signs or symptoms of dyspnea Outcome: Progressing   Problem: Skin  Integrity: Goal: Demonstration of wound healing without infection will improve Outcome: Progressing

## 2023-12-08 NOTE — TOC Transition Note (Signed)
 Transition of Care (TOC) - Discharge Note Rayfield Gobble RN, BSN Transitions of Care Unit 4E- RN Case Manager See Treatment Team for direct phone #   Patient Details  Name: [name] MRN: [id number] Date of Birth: [birth year]  Transition of Care [facility]) CM/SW Contact:  Gobble Rayfield Hurst, RN Phone Number: 12/08/2023, 11:19 AM   Clinical Narrative:    Pt stable for transition home today, HHPT has been arranged as per pt choice with [facility]. Liaison has been notified for start of care.   No further TOC needs noted. Family to transport home.    Final next level of care: Home w Home Health Services Barriers to Discharge: Barriers Resolved   Patient Goals and CMS Choice Patient states their goals for this hospitalization and ongoing recovery are:: return home   Choice offered to / list presented to : Patient      Discharge Placement                 Home w/ [facility]      Discharge Plan and Services Additional resources added to the After Visit Summary for     Discharge Planning Services: CM Consult Post Acute Care Choice: Home Health          DME Arranged: N/A DME Agency: NA       HH Arranged: PT HH Agency: [facility] Date HH Agency Contacted: 12/05/23 Time HH Agency Contacted: 1511 Representative spoke with at [facility] Agency: [place]  Social Drivers of Health (SDOH) Interventions SDOH Screenings   Food Insecurity: No Food Insecurity (12/03/2023)  Housing: Low Risk  (12/03/2023)  Transportation Needs: No Transportation Needs (12/03/2023)  Utilities: Not At Risk (12/03/2023)  Alcohol Screen: Low Risk  (11/26/2023)  Depression (PHQ2-9): Low Risk  (11/26/2023)  Financial Resource Strain: Low Risk  (11/26/2023)  Physical Activity: Inactive (11/26/2023)  Social Connections: Moderately Isolated (12/03/2023)  Stress: No Stress Concern Present (11/26/2023)  Tobacco Use: High Risk (11/26/2023)  Health Literacy: Adequate Health Literacy (11/26/2023)      Readmission Risk Interventions    12/08/2023   11:19 AM  Readmission Risk Prevention Plan  Transportation Screening Complete  PCP or Specialist Appt within 3-5 Days Complete  HRI or Home Care Consult Complete  Social Work Consult for Recovery Care Planning/Counseling Complete  Palliative Care Screening Not Applicable  Medication Review Oceanographer) Complete

## 2023-12-08 NOTE — Plan of Care (Signed)
 Discharge instructions discussed with patient.  Patient instructed on home medications, restrictions, and follow up appointments. Belongings gathered and sent with patient.  Patients medications sent to his pharmacy for pick up.

## 2023-12-08 NOTE — Discharge Summary (Signed)
 EVAR Discharge Summary   [name] Nov 01, 1945 78 y.o. 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 Serene.  The patient tolerated the procedure well and was admitted to the hospital postoperatively.  Postoperatively his renal function remained stable.  Throughout his hospital stay he tolerated a regular diet without any abdominal pain.  Hospital stay was prolonged due to ongoing sanguinous drainage from the right groin incision.  Plavix  was held postoperatively in addition to subcu heparin .  This was ultimately managed on the floor with manual compression and Dermabond.  At the time of discharge she has fullness in both groins however no further bleeding.  He was transfused in total 4 units of packed red blood cells however at the time of discharge his H&H is stable.  He will need a CBC in 1 week which can be arranged through our office or his PCP.  He will also resume his Plavix  in 3 days after discharge and this will be reflected in his after visit summary.  He will otherwise follow-up with Dr. Serene in 1 month with a CTA chest abdomen and pelvis.  He will be discharged home in stable condition.  CBC    Component Value Date/Time   WBC 6.6 12/08/2023 0359   RBC 2.78 (L) 12/08/2023 0359   HGB 8.3 (L) 12/08/2023 0359   HGB 11.3 (L) 09/23/2022 1032   HGB 13.7 05/06/2017 0926   HCT 25.7 (L) 12/08/2023 0359   HCT 36.4 (L) 09/23/2022 1032   HCT 41.9 05/06/2017 0926   PLT 51 (L) 12/08/2023 0359   PLT 145 (L) 09/23/2022 1032   MCV 92.4 12/08/2023 0359    MCV 88 09/23/2022 1032   MCV 100.7 (H) 05/06/2017 0926   MCH 29.9 12/08/2023 0359   MCHC 32.3 12/08/2023 0359   RDW 17.1 (H) 12/08/2023 0359   RDW 15.3 09/23/2022 1032   RDW 14.5 05/06/2017 0926   LYMPHSABS 0.9 10/29/2023 1924   LYMPHSABS 1.1 09/23/2022 1032   LYMPHSABS 1.0 05/06/2017 0926   MONOABS 0.4 10/29/2023 1924   MONOABS 0.4 05/06/2017 0926   EOSABS 0.1 10/29/2023 1924   EOSABS 0.1 09/23/2022 1032   BASOSABS 0.0 10/29/2023 1924   BASOSABS 0.0 09/23/2022 1032   BASOSABS 0.0 05/06/2017 0926    BMET    Component Value Date/Time   NA 138 12/08/2023 0359   NA 143 06/24/2023 1027   NA 142 05/06/2017 0926   K 4.1 12/08/2023 0359   K 4.0 05/06/2017 0926   CL 112 (H) 12/08/2023 0359   CL 101 01/25/2011 0901   CO2 21 (L) 12/08/2023 0359   CO2 27 05/06/2017 0926   GLUCOSE 103 (H) 12/08/2023 0359   GLUCOSE 108 05/06/2017 0926   GLUCOSE 112 01/25/2011 0901   BUN 24 (H) 12/08/2023 0359   BUN 22 06/24/2023 1027   BUN 14.6 05/06/2017 0926   CREATININE 1.84 (H) 12/08/2023 0359   CREATININE 1.1 05/06/2017 0926   CALCIUM  7.8 (L) 12/08/2023 0359   CALCIUM  9.7 05/06/2017 0926   GFRNONAA 37 (L) 12/08/2023 0359   GFRNONAA >89 12/29/2014 9089  GFRAA >60 02/14/2020 1020   GFRAA >89 12/29/2014 0910         Discharge Diagnosis:  AAA (abdominal aortic aneurysm) (HCC) [I71.40]  Secondary Diagnosis: Patient Active Problem List   Diagnosis Date Noted   Chronic heart failure with mildly reduced ejection fraction (HFmrEF, 41-49%) (HCC) 06/02/2023   S/P AAA repair 08/16/2022   Emphysema lung (HCC) 03/05/2022   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   Atrial fibrillation (HCC) 03/05/2021   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   Protein-calorie malnutrition, severe (HCC) 11/07/2020   Urinary retention 04/02/2018   Mild alcohol use disorder 12/09/2016   Premature atrial contractions 07/31/2015   Chronic use of opiate  for therapeutic purpose 05/01/2015   Atherosclerosis of aorta (HCC) 01/13/2015   Prediabetes 12/30/2014   AAA (abdominal aortic aneurysm) (HCC)    Healthcare maintenance 06/30/2012   ASCVD (arteriosclerotic cardiovascular disease) 03/17/2012   Erectile dysfunction 10/12/2010   Arthropathy of pelvic region and thigh 05/02/2008   CKD (chronic kidney disease) stage 2, GFR 60-89 ml/min 08/07/2007   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   Gout 04/18/2006   Tobacco use disorder 04/18/2006   Essential hypertension 04/18/2006   GERD 04/18/2006   Past Medical History:  Diagnosis Date   AAA (abdominal aortic aneurysm) (HCC)    followed by vasc surgery   Alcohol abuse    Barretts esophagus    bx distal esophagus 2011 neg    Bladder outlet obstruction 04/02/2018   CAD in native artery    a. s/p stent to prox RCA and mid Cx.   Cataract    Cholelithiasis    Chronic osteomyelitis involving ankle and foot, right (HCC) 07/13/2018   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   2006 - 3 adenomas largest 12 mm  2008 - no polyps 2013-  2 mm cecal polyp, adenoma, routine repeat colonoscopy 02/2017 approx 05/14/2017 18 polyps max 12 mm ALL adenomas recall 2019 [name], MD, [facility]      Dieulafoy lesion of duodenum    Diverticulosis of colon    DJD (degenerative joint disease)    left hip   Full dentures    upper and lower   GERD (gastroesophageal reflux disease)    diet controlled    Gout    feet   Hiatal hernia    2 cm noted on upper endos 2011    Hx of adenomatous colonic polyps    Hyperlipidemia    Hypertension    Internal hemorrhoid    Lung cancer (HCC)    squamous cell (T2N0MX), 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 pt was on Plavix  x 3 years then transitioned to ASA, no problems since   Non-small cell carcinoma of right lung, stage 1 (HCC) 01/30/2016   Renal failure, acute (HCC)    hx of 2/2 medication, resolved    Tobacco abuse    smoker .5 ppd x 55 yrs   UTI (lower urinary tract infection)      Allergies as of 12/08/2023       Reactions   Candesartan Cilexetil-hctz Other (See Comments)   Acute renal failure   Hydrochlorothiazide Other (See Comments)   Acute renal failure   Shellfish Allergy Other (See Comments)   Gout occurs   Betadine [povidone Iodine ] Itching        Medication List     TAKE these medications  albuterol  108 (90 Base) MCG/ACT inhaler Commonly known as: VENTOLIN  HFA INHALE 1-2 PUFFS BY MOUTH EVERY 6 HOURS AS NEEDED FOR WHEEZE OR SHORTNESS OF BREATH   amLODipine  10 MG tablet Commonly known as: NORVASC  Take 1 tablet (10 mg total) by mouth daily.   aspirin  EC 81 MG tablet Take 1 tablet (81 mg total) by mouth daily. Swallow whole. What changed: when to take this   clopidogrel  75 MG tablet Commonly known as: PLAVIX  Take 1 tablet (75 mg total) by mouth daily. Start taking on: December 11, 2023 What changed: These instructions start on December 11, 2023. If you are unsure what to do until then, ask your doctor or other care provider.   escitalopram  5 MG tablet Commonly known as: LEXAPRO  TAKE 1 TABLET (5 MG TOTAL) BY MOUTH DAILY.   HYDROcodone -acetaminophen  7.5-325 MG tablet Commonly known as: NORCO Take 1 tablet by mouth 2 (two) times daily as needed for moderate pain (pain score 4-6). Fill 30 days after last fill.   lidocaine  5 % Commonly known as: Lidoderm  Place 1 patch onto the skin daily. Remove & Discard patch within 12 hours or as directed by MD   metoprolol  succinate 25 MG 24 hr tablet Commonly known as: TOPROL -XL Take 1 tablet (25 mg total) by mouth daily.   mirtazapine  7.5 MG tablet Commonly known as: REMERON  Take 2 tablets (15 mg total) by mouth at bedtime.   pantoprazole  40 MG tablet Commonly known as: PROTONIX  TAKE 1 TABLET EVERY DAY FOR REFLUX   rosuvastatin  40 MG tablet Commonly known as: CRESTOR  Take 1 tablet (40 mg total) by mouth daily.    sacubitril -valsartan  97-103 MG Commonly known as: ENTRESTO  Take 1 tablet by mouth 2 (two) times daily.   senna 8.6 MG Tabs tablet Commonly known as: SENOKOT Take 2 tablets (17.2 mg total) by mouth in the morning and at bedtime. What changed: how much to take   Spiriva  Respimat 2.5 MCG/ACT Aers Generic drug: Tiotropium Bromide Monohydrate  Inhale 2 puffs into the lungs daily.   Zenpep  20000-63000 units Cpep Generic drug: Pancrelipase  (Lip-Prot-Amyl) Take 3 capsules by mouth 3 (three) times daily before meals.        Discharge Instructions:   [facility]  Discharge Instructions Endovascular Aortic Aneurysm Repair  Please refer to the following instructions for your post-procedure care. Your surgeon or Physician Assistant will discuss any changes with you.  Activity  You are encouraged to walk as much as you can. You can slowly return to normal activities but must avoid strenuous activity and heavy lifting until your doctor tells you it's OK. Avoid activities such as vacuuming or swinging a gold club. It is normal to feel tired for several weeks after your surgery. Do not drive until your doctor gives the OK and you are no longer taking prescription pain medications. It is also normal to have difficulty with sleep habits, eating, and bowel movements after surgery. These will go away with time.  Bathing/Showering  You may shower after you go home. If you have an incision, do not soak in a bathtub, hot tub, or swim until the incision heals completely.  Incision Care  Shower every day. Clean your incision with mild soap and water. Pat the area dry with a clean towel. You do not need a bandage unless otherwise instructed. Do not apply any ointments or creams to your incision. If you clothing is irritating, you may cover your incision with a dry gauze pad.  Diet  Resume your normal diet. There are no special food restrictions following this  procedure. A low fat/low cholesterol diet is recommended for all patients with vascular disease. In order to heal from your surgery, it is CRITICAL to get adequate nutrition. Your body requires vitamins, minerals, and protein. Vegetables are the best source of vitamins and minerals. Vegetables also provide the perfect balance of protein. Processed food has little nutritional value, so try to avoid this.  Medications  Resume taking all of your medications unless your doctor or Physician Assistnat tells you not to. If your incision is causing pain, you may take over-the-counter pain relievers such as acetaminophen  (Tylenol ). If you were prescribed a stronger pain medication, please be aware these medications can cause nausea and constipation. Prevent nausea by taking the medication with a snack or meal. Avoid constipation by drinking plenty of fluids and eating foods with a high amount of fiber, such as fruits, vegetables, and grains. Do not take Tylenol  if you are taking prescription pain medications.   Follow up  Our office will schedule a follow-up appointment with a C.T. scan 3-4 weeks after your surgery.  Please call us  immediately for any of the following conditions  Severe or worsening pain in your legs or feet or in your abdomen back or chest. Increased pain, redness, drainage (pus) from your incision sit. Increased abdominal pain, bloating, nausea, vomiting or persistent diarrhea. Fever of 101 degrees or higher. Swelling in your leg (s),  Reduce your risk of vascular disease  Stop smoking. If you would like help call [facility] at 1-800-QUIT-NOW ((904) 688-1593) or  at (774)805-2112. Manage your cholesterol Maintain a desired weight Control your diabetes Keep your blood pressure down  If you have questions, please call the office at [phone].    Disposition: home  Patient's condition: is Good  Follow up: 1. Dr. Serene in 4 weeks with CTA protocol   Donnice Sender, PA-C [facility] [phone] 12/08/2023  10:49 AM   - For VQI Registry use - Post-op:  Time to Extubation: [x]  In OR, [ ]  < 12 hrs, [ ]  12-24 hrs, [ ]  >=24 hrs Vasopressors Req. Post-op: No MI: No., [ ]  Troponin only, [ ]  EKG or Clinical New Arrhythmia: No CHF: No ICU Stay: 0 day in ICU Transfusion: Yes     If yes, 4 units given  Complications: Resp failure: No., [ ]  Pneumonia, [ ]  Ventilator Chg in renal function: No., [ ]  Inc. Cr > 0.5, [ ]  Temp. Dialysis,  [ ]  Permanent dialysis Leg ischemia: No., no Surgery needed, [ ]  Yes, Surgery needed,  [ ]  Amputation Bowel ischemia: No., [ ]  Medical Rx, [ ]  Surgical Rx Wound complication: No., [ ]  Superficial separation/infection, [ ]  Return to OR Return to OR: No  Return to OR for bleeding: No Stroke: No., [ ]  Minor, [ ]  Major  Discharge medications: Statin use:  Yes  ASA use:  Yes  Plavix  use:  Yes  Beta blocker use:  Yes  ARB use:  No ACEI use:  No CCB use:  Yes

## 2023-12-09 ENCOUNTER — Other Ambulatory Visit: Payer: Self-pay | Admitting: Internal Medicine

## 2023-12-09 ENCOUNTER — Other Ambulatory Visit: Payer: Self-pay | Admitting: Student in an Organized Health Care Education/Training Program

## 2023-12-09 ENCOUNTER — Telehealth: Payer: Self-pay

## 2023-12-09 DIAGNOSIS — I252 Old myocardial infarction: Secondary | ICD-10-CM | POA: Diagnosis not present

## 2023-12-09 DIAGNOSIS — K449 Diaphragmatic hernia without obstruction or gangrene: Secondary | ICD-10-CM | POA: Diagnosis not present

## 2023-12-09 DIAGNOSIS — I2510 Atherosclerotic heart disease of native coronary artery without angina pectoris: Secondary | ICD-10-CM | POA: Diagnosis not present

## 2023-12-09 DIAGNOSIS — I700 Atherosclerosis of aorta: Secondary | ICD-10-CM | POA: Diagnosis not present

## 2023-12-09 DIAGNOSIS — I4891 Unspecified atrial fibrillation: Secondary | ICD-10-CM | POA: Diagnosis not present

## 2023-12-09 DIAGNOSIS — K8020 Calculus of gallbladder without cholecystitis without obstruction: Secondary | ICD-10-CM | POA: Diagnosis not present

## 2023-12-09 DIAGNOSIS — F32A Depression, unspecified: Secondary | ICD-10-CM

## 2023-12-09 DIAGNOSIS — K2270 Barrett's esophagus without dysplasia: Secondary | ICD-10-CM | POA: Diagnosis not present

## 2023-12-09 DIAGNOSIS — K5730 Diverticulosis of large intestine without perforation or abscess without bleeding: Secondary | ICD-10-CM | POA: Diagnosis not present

## 2023-12-09 DIAGNOSIS — E43 Unspecified severe protein-calorie malnutrition: Secondary | ICD-10-CM | POA: Diagnosis not present

## 2023-12-09 DIAGNOSIS — F17210 Nicotine dependence, cigarettes, uncomplicated: Secondary | ICD-10-CM | POA: Diagnosis not present

## 2023-12-09 DIAGNOSIS — N182 Chronic kidney disease, stage 2 (mild): Secondary | ICD-10-CM | POA: Diagnosis not present

## 2023-12-09 DIAGNOSIS — E785 Hyperlipidemia, unspecified: Secondary | ICD-10-CM | POA: Diagnosis not present

## 2023-12-09 DIAGNOSIS — H269 Unspecified cataract: Secondary | ICD-10-CM | POA: Diagnosis not present

## 2023-12-09 DIAGNOSIS — J439 Emphysema, unspecified: Secondary | ICD-10-CM | POA: Diagnosis not present

## 2023-12-09 DIAGNOSIS — I5022 Chronic systolic (congestive) heart failure: Secondary | ICD-10-CM | POA: Diagnosis not present

## 2023-12-09 DIAGNOSIS — M1612 Unilateral primary osteoarthritis, left hip: Secondary | ICD-10-CM | POA: Diagnosis not present

## 2023-12-09 DIAGNOSIS — Z48812 Encounter for surgical aftercare following surgery on the circulatory system: Secondary | ICD-10-CM | POA: Diagnosis not present

## 2023-12-09 DIAGNOSIS — M109 Gout, unspecified: Secondary | ICD-10-CM | POA: Diagnosis not present

## 2023-12-09 DIAGNOSIS — I130 Hypertensive heart and chronic kidney disease with heart failure and stage 1 through stage 4 chronic kidney disease, or unspecified chronic kidney disease: Secondary | ICD-10-CM | POA: Diagnosis not present

## 2023-12-09 DIAGNOSIS — N320 Bladder-neck obstruction: Secondary | ICD-10-CM | POA: Diagnosis not present

## 2023-12-09 DIAGNOSIS — Z435 Encounter for attention to cystostomy: Secondary | ICD-10-CM | POA: Diagnosis not present

## 2023-12-09 DIAGNOSIS — M1909 Primary osteoarthritis, other specified site: Secondary | ICD-10-CM | POA: Diagnosis not present

## 2023-12-09 DIAGNOSIS — K219 Gastro-esophageal reflux disease without esophagitis: Secondary | ICD-10-CM | POA: Diagnosis not present

## 2023-12-09 NOTE — Transitions of Care (Post Inpatient/ED Visit) (Signed)
   12/09/2023  Name: [name] MRN: [id number] DOB: March 20, 1946  Today's TOC FU Call Status: Today's TOC FU Call Status:: Unsuccessful Call (1st Attempt) Unsuccessful Call (1st Attempt) Date: 12/09/23  Attempted to reach the patient regarding the most recent Inpatient/ED visit.  Follow Up Plan: Additional outreach attempts will be made to reach the patient to complete the Transitions of Care (Post Inpatient/ED visit) call.   Medford Balboa, BSN, RN [place]  [facility] - [facility] RN Care Manager [phone]

## 2023-12-09 NOTE — Telephone Encounter (Signed)
 Both medication discontinued from med. List.

## 2023-12-10 ENCOUNTER — Telehealth: Payer: Self-pay

## 2023-12-10 NOTE — Transitions of Care (Post Inpatient/ED Visit) (Signed)
   12/10/2023  Name: GAELEN BRAGER MRN: [id number] DOB: [birth year]  Today's TOC FU Call Status: Today's TOC FU Call Status:: Unsuccessful Call (2nd Attempt) Unsuccessful Call (1st Attempt) Date: 12/09/23 Unsuccessful Call (2nd Attempt) Date: 12/10/23  Attempted to reach the patient regarding the most recent Inpatient/ED visit.  Follow Up Plan: Additional outreach attempts will be made to reach the patient to complete the Transitions of Care (Post Inpatient/ED visit) call.   Medford Balboa, BSN, RN [place]  [facility]BCI - [facility] RN Care Manager [phone]

## 2023-12-11 ENCOUNTER — Telehealth: Payer: Self-pay

## 2023-12-11 ENCOUNTER — Telehealth: Payer: Self-pay | Admitting: *Deleted

## 2023-12-11 NOTE — Telephone Encounter (Signed)
 Agree, could they substitute chlorhexidine  for the betadine.

## 2023-12-11 NOTE — Telephone Encounter (Signed)
 Call to Nena, RN [facility] order given to use Chlorhexidine  instead of Betadine for patient when cleaning for his Supra Pubic Catheter insertions per Dr. Rosan.

## 2023-12-11 NOTE — Transitions of Care (Post Inpatient/ED Visit) (Signed)
   12/11/2023  Name: ERCEL PEPITONE MRN: [id number] DOB: 19-Mar-1946  Today's TOC FU Call Status: Today's TOC FU Call Status:: Unsuccessful Call (3rd Attempt) Unsuccessful Call (1st Attempt) Date: 12/09/23 Unsuccessful Call (2nd Attempt) Date: 12/10/23 Unsuccessful Call (3rd Attempt) Date: 12/11/23  Attempted to reach the patient regarding the most recent Inpatient/ED visit.  Follow Up Plan: No further outreach attempts will be made at this time. We have been unable to contact the patient.  Medford Balboa, BSN, RN [place]  [facility]BCI - [facility] RN Care Manager [phone]

## 2023-12-11 NOTE — Telephone Encounter (Signed)
 RTC to Nena, RN with [facility] Requesting Skilled Nursing visits with patient  1 time a week for 4 weeks and then every other week x's 2 .  Will work on Disease Management,  Medication teaching and need to insert a Supra Pubic Catheter  weekly.   Patient is allergic to Betadine would like an order as to what can be used instead.    Verbal approval given for visits .  Will forward to PCP for approval or denial as well as what can be used instead of the Betadine for the Supra Pubic Catheter insertions. Copied from CRM [id number]. 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 No

## 2023-12-14 IMAGING — MR MR 3D RECON AT SCANNER
4 of 7 series · 22 of 48 positions shown · non-contrast
Comparison: Abdominopelvic CTA 06/19/2021 and 03/06/2021. Abdominal
MRI 11/08/2020.

CLINICAL DATA: Unintentional weight loss with decreased appetite.
Pancreatic mucinous tumor suspected.

EXAM:
MRI ABDOMEN WITHOUT CONTRAST  (INCLUDING MRCP)
TECHNIQUE: Multiplanar multisequence MR imaging of the abdomen was performed.
Heavily T2-weighted images of the biliary and pancreatic ducts were
obtained, and three-dimensional MRCP images were rendered by post
processing.

[Series 3: cor ssfse nav · coronal · 6.0mm · 0.78mm/px · 4 of 29 slices shown]
[im 1/29]
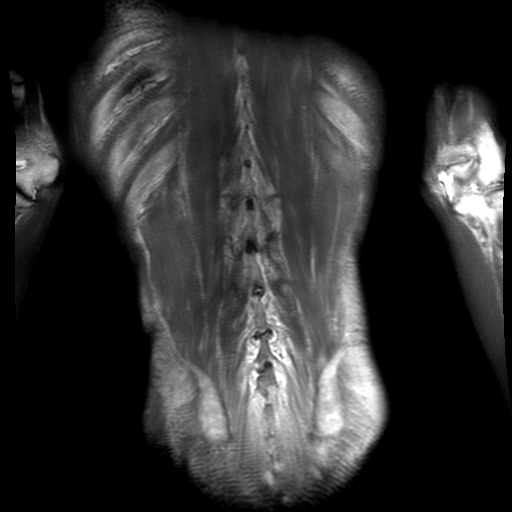
[im 10/29]
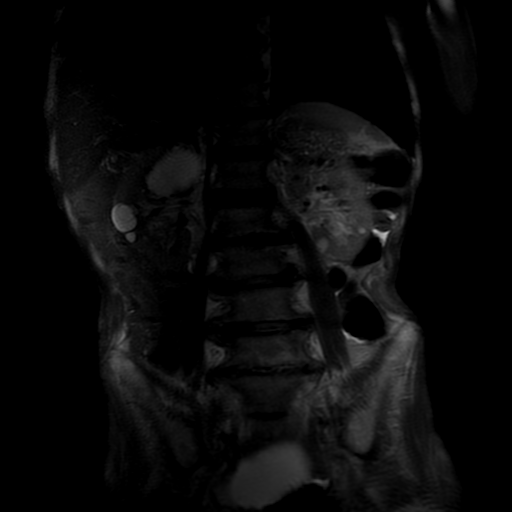
[im 19/29]
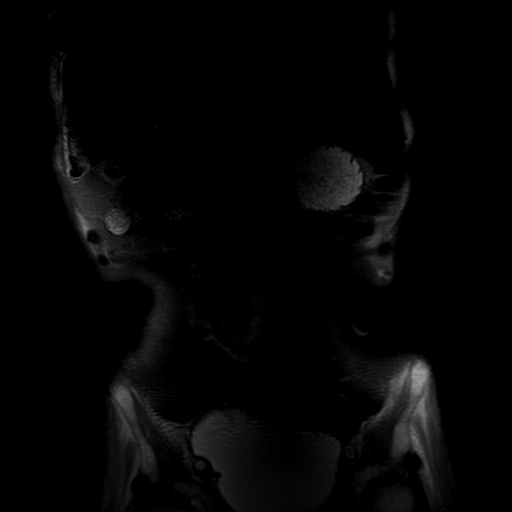
[im 29/29]
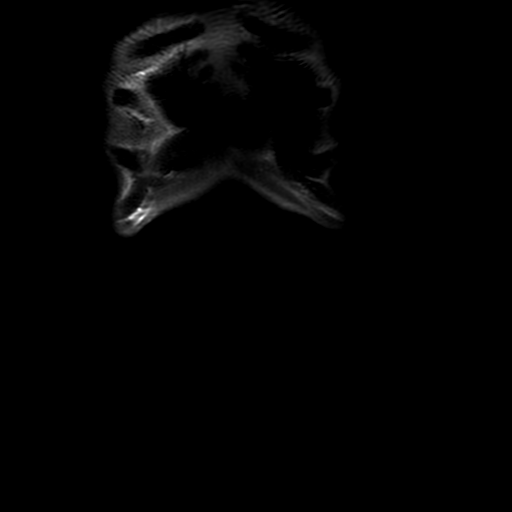

[Series 4: ax ssfse nav · axial · 6.0mm · 0.74mm/px · z∈[-78,+162]mm · 6 of 41 slices shown]
[im 1/41]
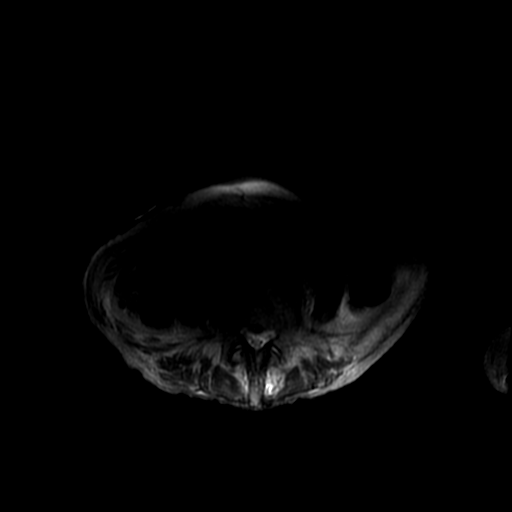
[im 9/41]
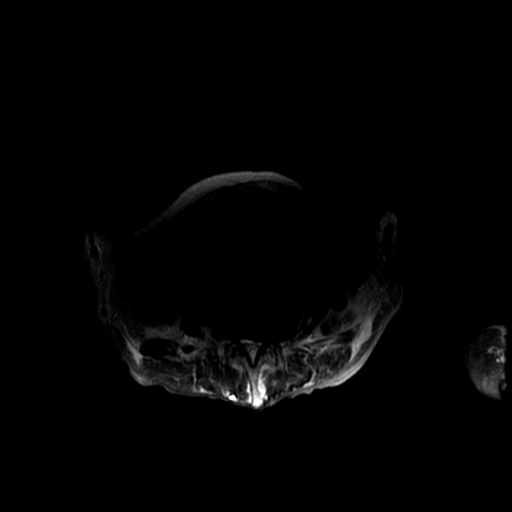
[im 17/41]
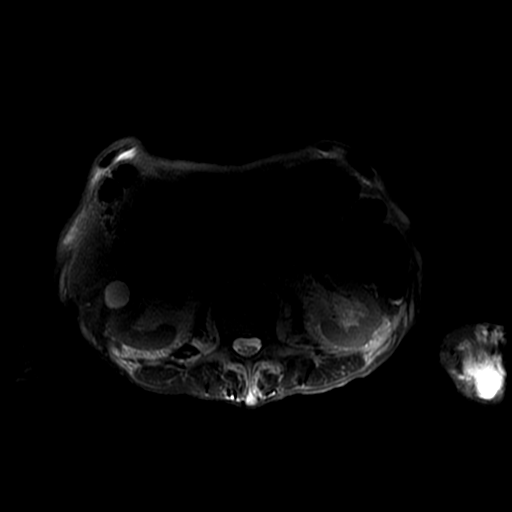
[im 25/41]
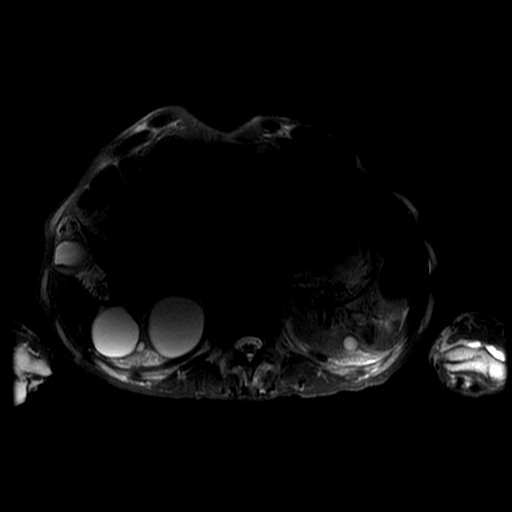
[im 33/41]
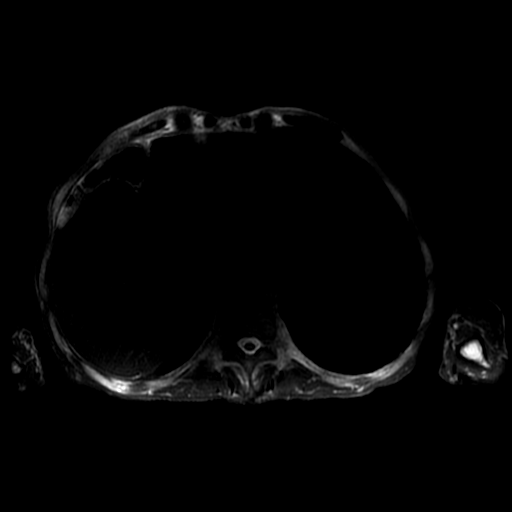
[im 41/41]
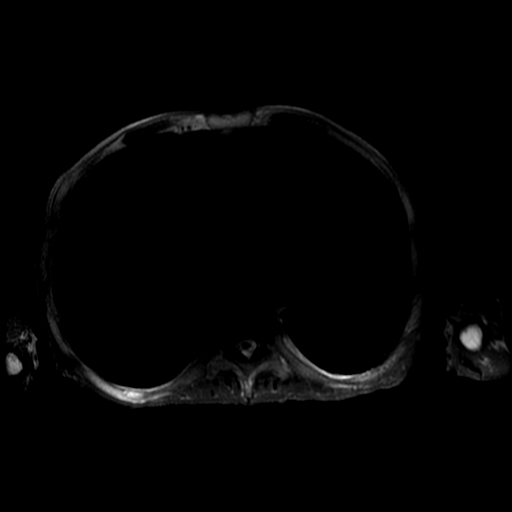

[Series 5: T2 fat-sat · axial · 6.0mm · 0.74mm/px · z∈[-76,+161]mm · 6 of 37 slices shown]
[im 1/37]
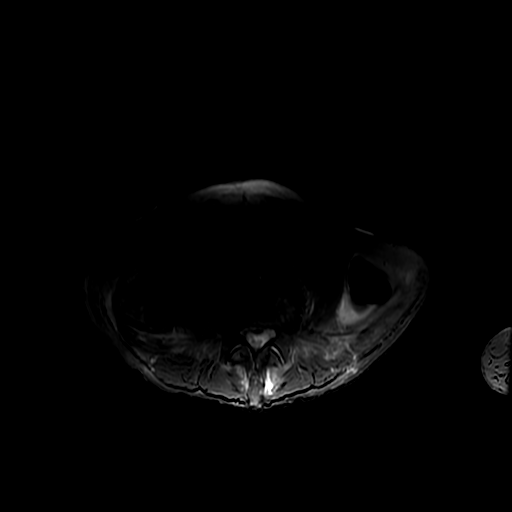
[im 8/37]
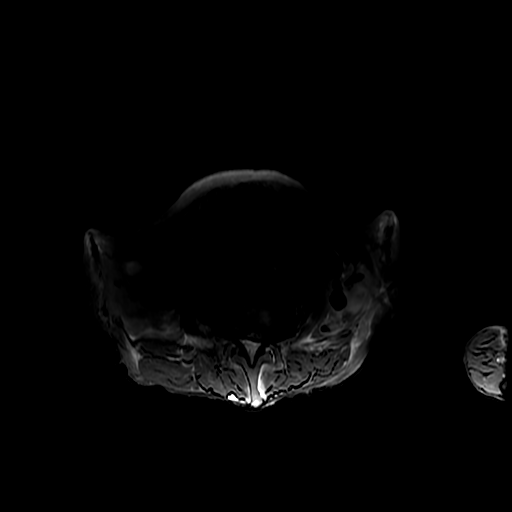
[im 15/37]
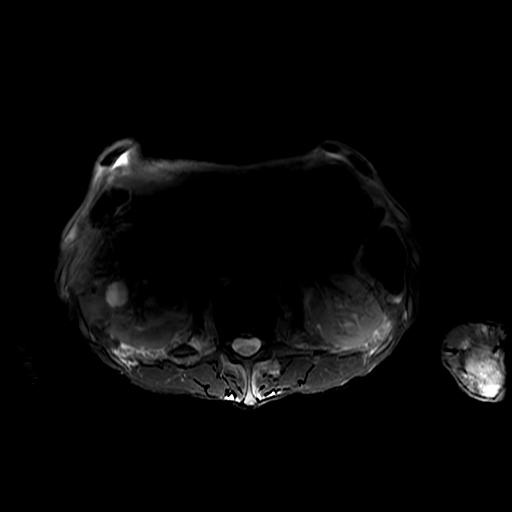
[im 22/37]
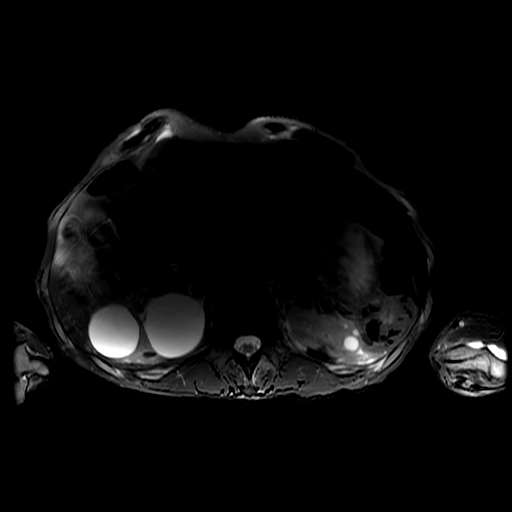
[im 29/37]
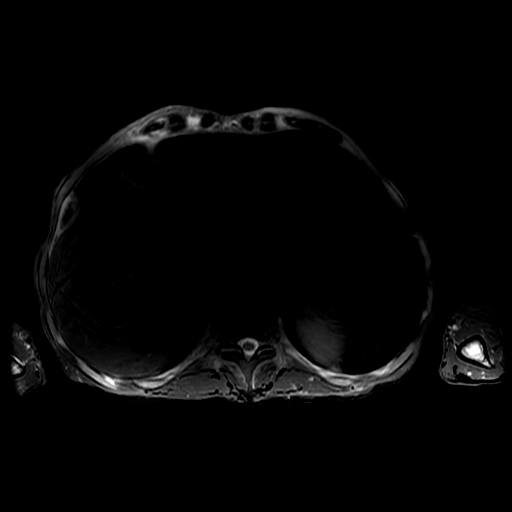
[im 37/37]
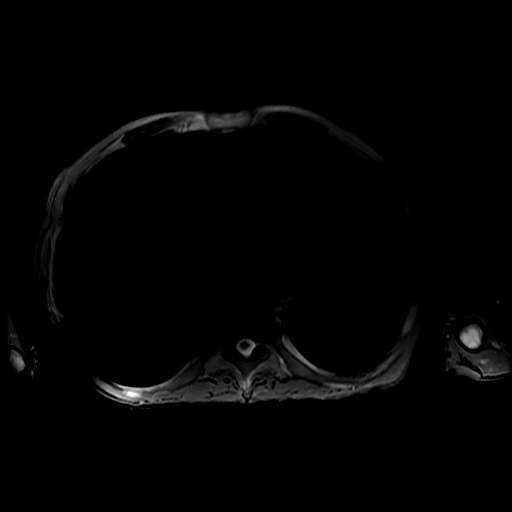

[Series 6: DWI b500 · axial · 8.0mm · 1.48mm/px · z∈[-72,+117]mm · 6 of 48 slices shown]
[im 1/48]
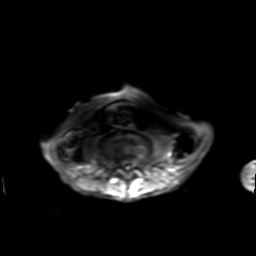
[im 8/48]
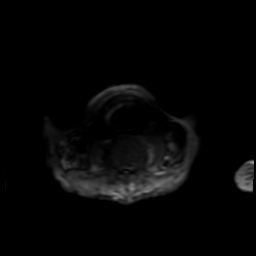
[im 16/48]
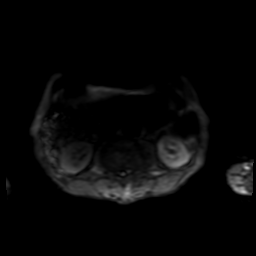
[im 24/48]
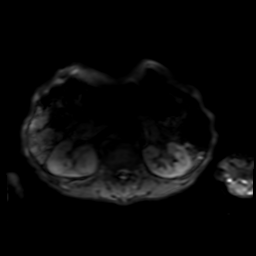
[im 32/48]
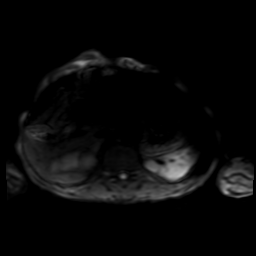
[im 40/48]
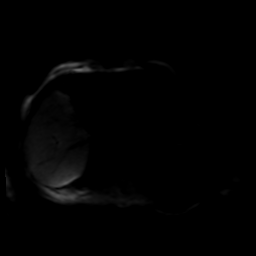

[22 of 48 positions shown; findings below may reference images not displayed]

FINDINGS: Despite efforts by the technologist and patient, moderate motion
artifact is present on today's exam and could not be eliminated.
This reduces exam sensitivity and specificity. Patient was unable to
complete the examination. The MRCP images are nondiagnostic.

Lower chest: The visualized lower chest appears unremarkable. There
is chronic elevation of the right hemidiaphragm.

Hepatobiliary: No focal hepatic abnormality identified. Gallstones
are again noted without evidence of gallbladder wall thickening or
significant biliary dilatation. The MRCP images are nondiagnostic
due to motion.

Pancreas: Pancreatic assessment limited due to motion. No pancreatic
ductal dilatation or acute abnormality identified. Tiny foci of T2
hyperintensity within the pancreatic head are similar to previous
MRI.

Spleen: Normal in size without focal abnormality.

Adrenals/Urinary Tract: Both adrenal glands appear normal. Bilateral
renal cysts are again noted. Previously noted T1 hyperintense lesion
in the upper pole the left kidney has decreased in size. No
hydronephrosis. The bladder is partially imaged on the coronal
images and appears moderately distended.

Stomach/Bowel: Limited assessment. No evidence of significant bowel
distension or acute inflammation.

Vascular/Lymphatic: There are no enlarged abdominal lymph nodes.
Fusiform aneurysmal dilatation of the abdominal aorta is again noted
with associated irregular mural thrombus. The aorta measures up to
6.6 x 5.7 cm transverse on image 34/4 (compared with 6.2 x 5.5 cm on
most recent CT). Measured in the coronal plane, the aorta measures
5.9 cm, which is unchanged from the prior CT. This aneurysm has been
shown to be gradually enlarging compared with older prior studies.
No definite signs of acute rupture.

Other: No significant ascites.

Musculoskeletal: No acute or significant osseous findings.
Multilevel spondylosis.
IMPRESSION: 1. Study is significantly limited by motion artifact. The MRCP
images are nondiagnostic, and there is limited assessment of the
pancreas.
2. Cholelithiasis without evidence of cholecystitis or biliary
dilatation.
3. Tiny T2 hyperintense lesions within the pancreatic head are
similar to previous MRI, but suboptimally evaluated due to motion.
If determine to require additional follow-up clinically, this would
be best performed with CT in this patient due to the limitations of
MRI.
4. Slowly enlarging abdominal aortic aneurysm as evaluated on CTA
last month. Vascular surgery consultation recommended due to
increased risk of rupture for AAA >5.5 cm. This recommendation
follows ACR consensus. This recommendation follows ACR consensus
guidelines: White Paper of the ACR Incidental Findings Committee II

## 2023-12-15 ENCOUNTER — Other Ambulatory Visit: Payer: Self-pay

## 2023-12-15 DIAGNOSIS — I714 Abdominal aortic aneurysm, without rupture, unspecified: Secondary | ICD-10-CM

## 2023-12-15 DIAGNOSIS — Z8679 Personal history of other diseases of the circulatory system: Secondary | ICD-10-CM

## 2023-12-15 IMAGING — CT CT CHEST W/ CM
2 of 4 series · 14 of 36 positions shown, 17 images · IV contrast (APPLIED)
Comparison: 08/29/2020

CLINICAL DATA: Unintended weight loss

EXAM:
CT CHEST WITH CONTRAST
TECHNIQUE: Multidetector CT imaging of the chest was performed during
intravenous contrast administration.

[Series 3: chest w · axial · 0.62mm/px · z∈[+1274,+1598]mm · 11 of 192 slices shown, 14 images]
[im 15/192  mediastinal]
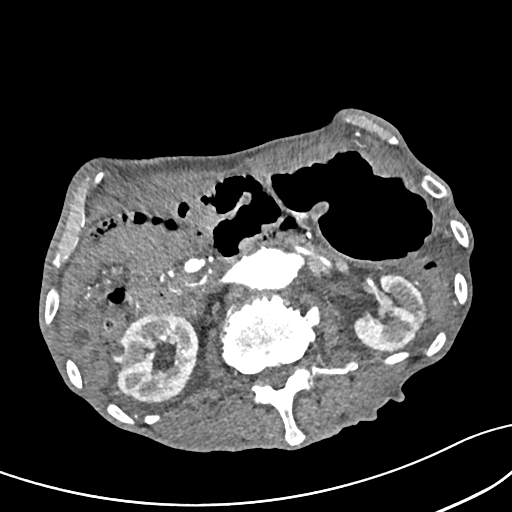
[im 15/192  lung]
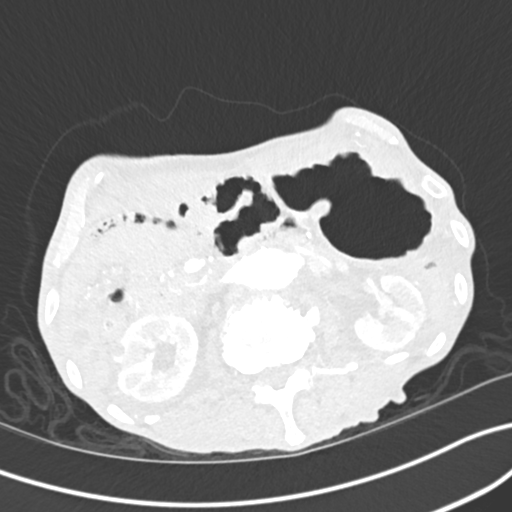
[im 30/192  lung]
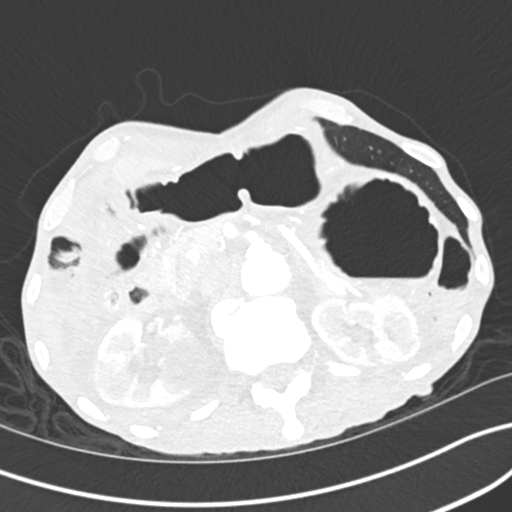
[im 45/192  lung]
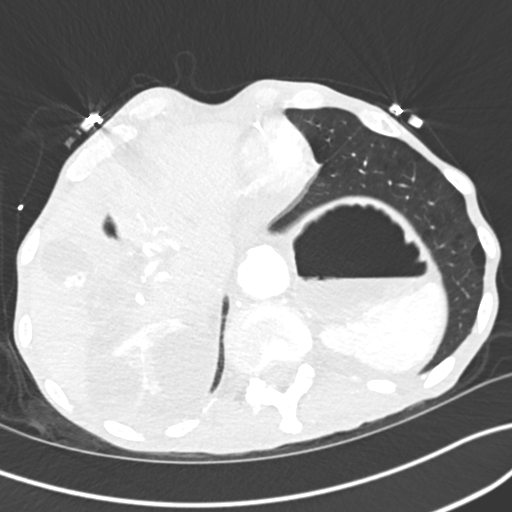
[im 59/192  lung]
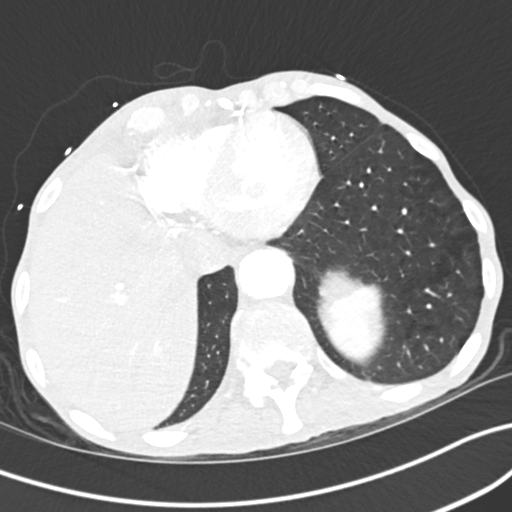
[im 74/192  mediastinal]
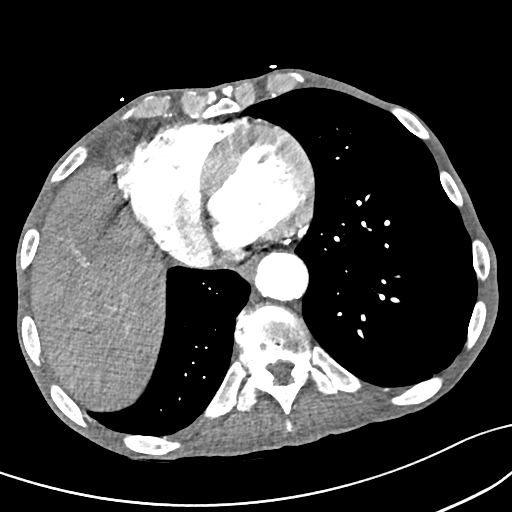
[im 74/192  lung]
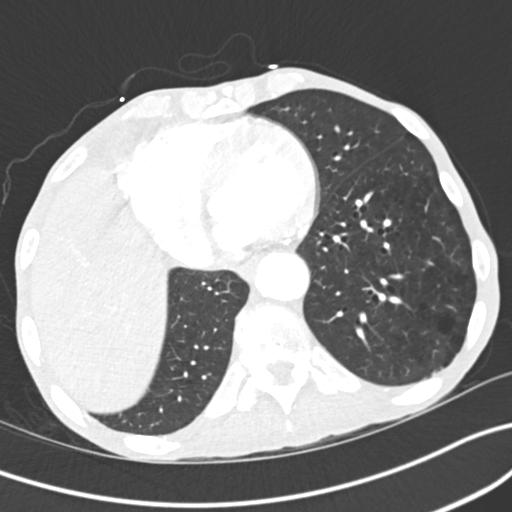
[im 103/192  lung]
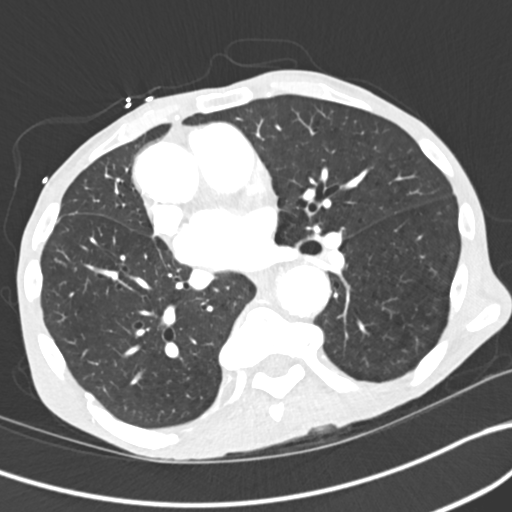
[im 118/192  lung]
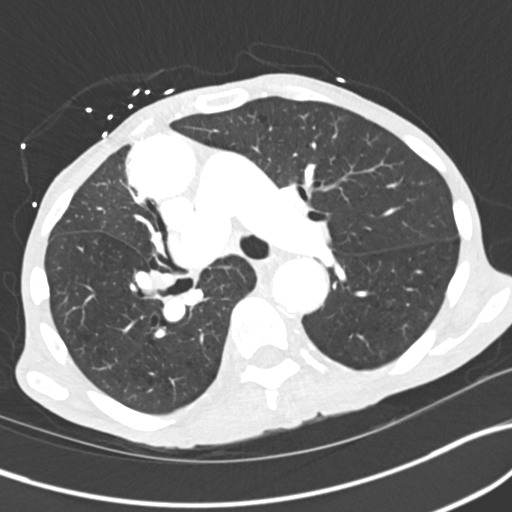
[im 133/192  lung]
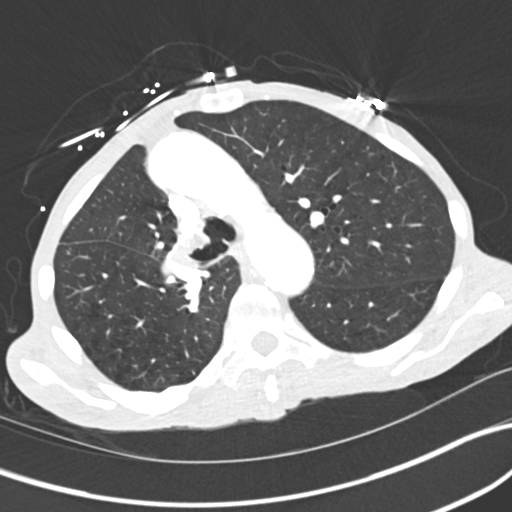
[im 147/192  mediastinal]
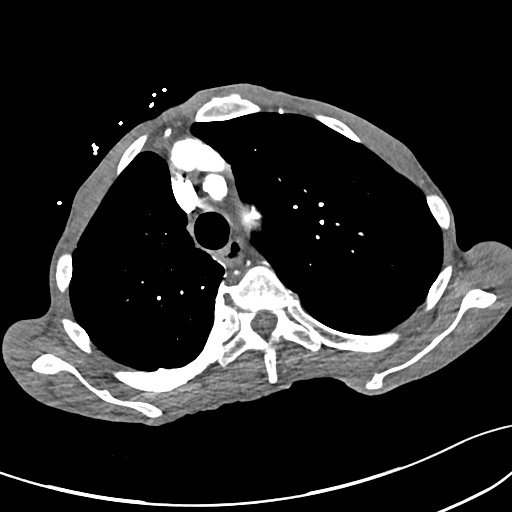
[im 147/192  lung]
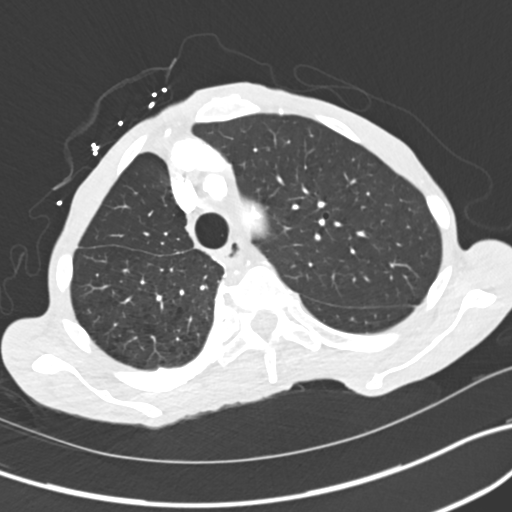
[im 162/192  lung]
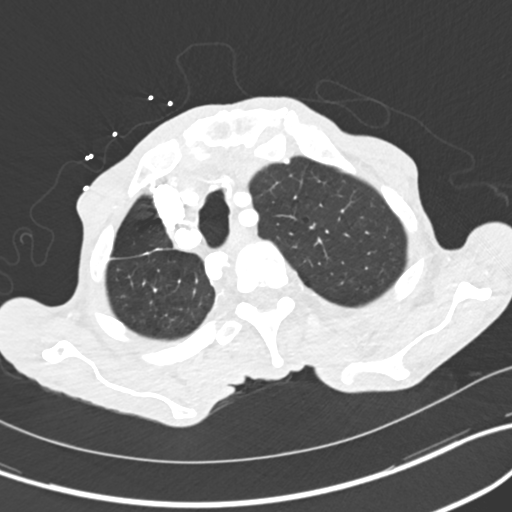
[im 177/192  lung]
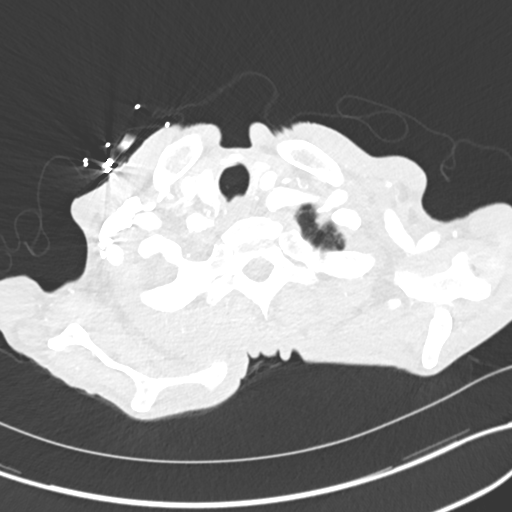

[Series 6: cor · coronal · 0.66mm/px · 3 of 124 slices shown]
[im 25/124  lung]
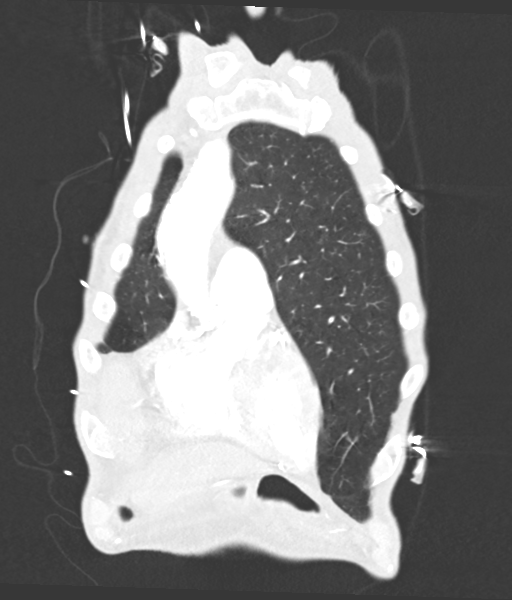
[im 50/124  lung]
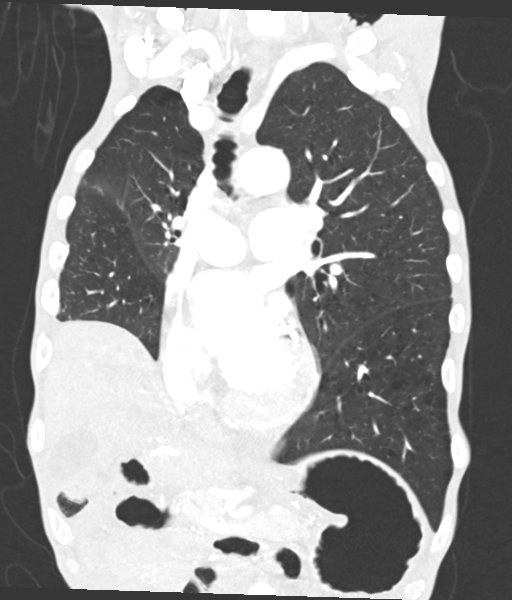
[im 74/124  lung]
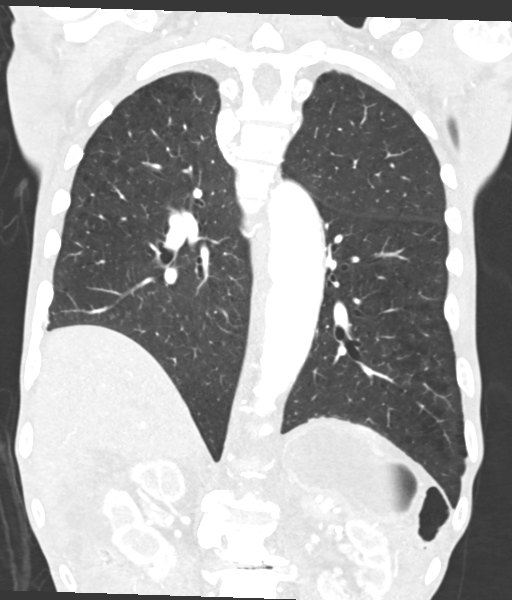

[14 of 36 positions shown; findings below may reference images not displayed]

RADIATION DOSE REDUCTION: This exam was performed according to the
departmental dose-optimization program which includes automated
exposure control, adjustment of the mA and/or kV according to
patient size and/or use of iterative reconstruction technique.

CONTRAST:  75mL OMNIPAQUE IOHEXOL 300 MG/ML  SOLN
FINDINGS: Cardiovascular: Coronary artery calcifications are seen. There is
ectasia of main pulmonary artery measuring 3.9 cm. Ascending
thoracic aorta at the level of main pulmonary artery measures 3.9 cm
in diameter

Mediastinum/Nodes: There is nodularity in the anterior margin of
proximal right main bronchus. In the coronal reconstruction images,
there is 1.4 cm soft tissue density in the proximal right main
bronchus projecting into lower trachea. These findings appear more
prominent. There are slightly enlarged lymph nodes in the right
hilum. Thyroid is not enlarged. There is inhomogeneous attenuation
in thyroid.

Lungs/Pleura: Centrilobular emphysema is seen. There are few blebs
in the apices. There is no focal pulmonary consolidation. There is
no pleural effusion or pneumothorax.

Upper Abdomen: Gallbladder stones are seen. There is fatty liver.
There are smooth marginated low-density lesions in the visualized
portions of both kidneys largest measuring 5.2 cm in diameter in the
upper pole of right kidney.

Musculoskeletal: Degenerative changes are noted with spinal stenosis
at L2-L3 level. Deformity is seen in multiple right ribs with no
significant interval change, possibly congenital variation or
residual change from previous trauma. There is ectasia of main
pulmonary artery suggesting pulmonary arterial hypertension. There
is ectasia in thoracic aorta. Extensive coronary artery
calcifications are seen.
IMPRESSION: There is nodularity in the anterior margin of proximal right main
bronchus. There is possible 1.4 cm soft tissue density in the
proximal right main bronchus projecting into the lower trachea.
Findings may suggest scarring or neoplastic process. PET-CT and
bronchoscopy if warranted should be considered.

There are slightly enlarged lymph nodes in the right hilum with no
significant interval change, possibly suggesting reactive
hyperplasia.

There is no focal pulmonary consolidation. COPD. There are no
discrete lung nodules. There is no pleural effusion or pneumothorax.

There is ectasia of main pulmonary artery suggesting pulmonary
arterial hypertension. There is ectasia of ascending thoracic aorta.
Coronary artery calcifications are seen.

Calcified gallbladder stone. Bilateral renal cysts. Lumbar
spondylosis, particularly severe at L2-L3 level with spinal
stenosis.

Other findings as described in the body of the report.

## 2023-12-18 ENCOUNTER — Other Ambulatory Visit: Payer: Self-pay | Admitting: Internal Medicine

## 2023-12-18 DIAGNOSIS — J439 Emphysema, unspecified: Secondary | ICD-10-CM

## 2023-12-18 NOTE — Telephone Encounter (Signed)
 Copied from CRM [id number]. Topic: Clinical - Medication Refill >> Dec 18, 2023 11:38 AM Shamecia H wrote: Medication: albuterol  (VENTOLIN  HFA) 108 (90 Base) MCG/ACT inhaler, Tiotropium Bromide Monohydrate  (SPIRIVA  RESPIMAT) 2.5 MCG/ACT AERS [[id number]]  Has the patient contacted their pharmacy? Yes (Agent: If no, request that the patient contact the pharmacy for the refill. If patient does not wish to contact the pharmacy document the reason why and proceed with request.) (Agent: If yes, when and what did the pharmacy advise?)  This is the patient's preferred pharmacy:  [id number] [place], [place] - [address] AT [facility] [address] [place] [address] Phone: [phone] Fax: [phone]  Is this the correct pharmacy for this prescription? Yes If no, delete pharmacy and type the correct one.   Has the prescription been filled recently? No  Is the patient out of the medication? No  Has the patient been seen for an appointment in the last year OR does the patient have an upcoming appointment? Yes  Can we respond through MyChart? Yes  Agent: Please be advised that Rx refills may take up to 3 business days. We ask that you follow-up with your pharmacy.

## 2023-12-22 ENCOUNTER — Encounter: Payer: Self-pay | Admitting: Internal Medicine

## 2023-12-22 ENCOUNTER — Other Ambulatory Visit: Payer: Self-pay

## 2023-12-22 ENCOUNTER — Ambulatory Visit: Admitting: Internal Medicine

## 2023-12-22 VITALS — BP 109/50 | HR 63 | Temp 98.0°F | Ht 72.0 in | Wt 138.4 lb

## 2023-12-22 DIAGNOSIS — Z9889 Other specified postprocedural states: Secondary | ICD-10-CM

## 2023-12-22 DIAGNOSIS — D649 Anemia, unspecified: Secondary | ICD-10-CM | POA: Diagnosis not present

## 2023-12-22 DIAGNOSIS — D696 Thrombocytopenia, unspecified: Secondary | ICD-10-CM

## 2023-12-22 DIAGNOSIS — I7142 Juxtarenal abdominal aortic aneurysm, without rupture: Secondary | ICD-10-CM | POA: Diagnosis not present

## 2023-12-22 DIAGNOSIS — Z9359 Other cystostomy status: Secondary | ICD-10-CM

## 2023-12-22 DIAGNOSIS — N182 Chronic kidney disease, stage 2 (mild): Secondary | ICD-10-CM

## 2023-12-22 DIAGNOSIS — Z8679 Personal history of other diseases of the circulatory system: Secondary | ICD-10-CM

## 2023-12-22 NOTE — Assessment & Plan Note (Signed)
 Repeat CBC

## 2023-12-22 NOTE — Assessment & Plan Note (Signed)
 Repeat BMP today.

## 2023-12-22 NOTE — Progress Notes (Unsigned)
 Established Patient Office Visit  Subjective   Patient ID: [name], male    DOB: 09-Oct-1945  Age: 78 y.o. MRN: [id number]  No chief complaint on file.  This is my first visit with Jonette after I have been reassigned as his PCP.  He is here for a hospital follow up. Follow up Hospitalization  Patient was admitted to [facility] on 12/02/23 and discharged on 12/08/23. He was treated for AAA. Treatment for this included endovascular repair of AAA with stenting of Celiac artery, SMA, left renal and right renal artery by Dr Serene. Telephone follow up was done on 6/24, 6/25, 3x unsuccessful. He reports fair compliance with treatment. He reports this condition is improved.  Plavix  was due to be resumed 3 days after discharge and repeat CBC within 1 week. Due for follow up with Dr Serene in 1 month for CTA chest abdomen and pelvis.  He reports to me that he did not understand the instructions about the plavix  and has been taking his medications as he always has since discharge.  He denies any pain or SOB.  He overall feels fine except that his usual appetite has not returned.  He has been trying to drink some ensures.  He does have an appointment at the end of the month with vascular surgery.  He did have one fall since discharge, he notes that he did close his suprapubic urinary catheter bag and urine spilled on the floor causing him to slip.  He denies any injury as a result of the fall.  ----------------------------------------------------------------------------------------- -       Objective:     There were no vitals taken for this visit. BP Readings from Last 3 Encounters:  12/22/23 (!) 124/47  12/08/23 117/61  11/03/23 132/76   Wt Readings from Last 3 Encounters:  12/22/23 138 lb 6.4 oz (62.8 kg)  12/07/23 155 lb 3.3 oz (70.4 kg)  11/26/23 135 lb (61.2 kg)      Physical Exam Vitals and nursing note reviewed.  Constitutional:      Appearance: He is not  ill-appearing.     Comments: Clothing urinous  Cardiovascular:     Rate and Rhythm: Normal rate and regular rhythm.  Pulmonary:     Effort: Pulmonary effort is normal.     Breath sounds: Normal breath sounds.  Skin:    Comments: No erythema or drainage out of vascular access sites of groin.  Psychiatric:        Mood and Affect: Mood normal.     Left groin   Right groin No results found for any visits on 12/22/23.  Last CBC Lab Results  Component Value Date   WBC 6.6 12/08/2023   HGB 8.3 (L) 12/08/2023   HCT 25.7 (L) 12/08/2023   MCV 92.4 12/08/2023   MCH 29.9 12/08/2023   RDW 17.1 (H) 12/08/2023   PLT 51 (L) 12/08/2023   Last metabolic panel Lab Results  Component Value Date   GLUCOSE 103 (H) 12/08/2023   NA 138 12/08/2023   K 4.1 12/08/2023   CL 112 (H) 12/08/2023   CO2 21 (L) 12/08/2023   BUN 24 (H) 12/08/2023   CREATININE 1.84 (H) 12/08/2023   GFRNONAA 37 (L) 12/08/2023   CALCIUM  7.8 (L) 12/08/2023   PHOS 2.9 09/23/2022   PROT 7.0 10/29/2023   ALBUMIN  3.3 (L) 10/29/2023   LABGLOB 4.1 09/23/2022   AGRATIO 0.9 (L) 09/23/2022   BILITOT 0.5 10/29/2023   ALKPHOS 53 10/29/2023  AST 32 10/29/2023   ALT 22 10/29/2023   ANIONGAP 5 12/08/2023      The ASCVD Risk score (Arnett DK, et al., 2019) failed to calculate for the following reasons:   Risk score cannot be calculated because patient has a medical history suggesting prior/existing ASCVD    Assessment & Plan:   Problem List Items Addressed This Visit   None   No follow-ups on file.    Day[place] JAYSON Eastern, DO

## 2023-12-22 NOTE — Assessment & Plan Note (Signed)
 Now s/p endovascular repair, has follow up with vascular surgery and plan for repat CTA C/A/P.

## 2023-12-22 NOTE — Assessment & Plan Note (Signed)
 Remains in place.

## 2023-12-23 ENCOUNTER — Ambulatory Visit: Payer: Self-pay | Admitting: Internal Medicine

## 2023-12-23 LAB — CBC
Hematocrit: 28.4 % — ABNORMAL LOW (ref 37.5–51.0)
Hemoglobin: 9.1 g/dL — ABNORMAL LOW (ref 13.0–17.7)
MCH: 30.5 pg (ref 26.6–33.0)
MCHC: 32 g/dL (ref 31.5–35.7)
MCV: 95 fL (ref 79–97)
Platelets: 129 x10E3/uL — ABNORMAL LOW (ref 150–450)
RBC: 2.98 x10E6/uL — ABNORMAL LOW (ref 4.14–5.80)
RDW: 15.8 % — ABNORMAL HIGH (ref 11.6–15.4)
WBC: 6.2 x10E3/uL (ref 3.4–10.8)

## 2023-12-23 LAB — BMP8+ANION GAP
Anion Gap: 15 mmol/L (ref 10.0–18.0)
BUN/Creatinine Ratio: 12 (ref 10–24)
BUN: 17 mg/dL (ref 8–27)
CO2: 20 mmol/L (ref 20–29)
Calcium: 8.6 mg/dL (ref 8.6–10.2)
Chloride: 107 mmol/L — ABNORMAL HIGH (ref 96–106)
Creatinine, Ser: 1.37 mg/dL — ABNORMAL HIGH (ref 0.76–1.27)
Glucose: 97 mg/dL (ref 70–99)
Potassium: 3.8 mmol/L (ref 3.5–5.2)
Sodium: 142 mmol/L (ref 134–144)
eGFR: 53 mL/min/1.73 — ABNORMAL LOW (ref >59)

## 2023-12-23 MED ORDER — SPIRIVA RESPIMAT 2.5 MCG/ACT IN AERS: 2.0000 | INHALATION_SPRAY | Freq: Every day | RESPIRATORY_TRACT | 2 refills | Status: DC

## 2023-12-23 MED ORDER — ALBUTEROL SULFATE HFA 108 (90 BASE) MCG/ACT IN AERS: 1.0000 | INHALATION_SPRAY | Freq: Four times a day (QID) | RESPIRATORY_TRACT | 2 refills | Status: AC | PRN

## 2023-12-24 NOTE — Assessment & Plan Note (Signed)
 Has follow up with Vascular surgery scheduled for later this month and will have repeat AAA imaging .  Access sites do not appear infected but he does not appear to be doing any wound care.  Reviewed D/C instructions and advised to clean with soap and water.

## 2023-12-25 ENCOUNTER — Encounter: Admitting: Internal Medicine

## 2023-12-30 NOTE — Progress Notes (Unsigned)
  Electrophysiology Office Note:   Date:  01/01/2024  ID:  [name], DOB May 04, 1946, MRN [id number]  Primary Cardiologist: None Electrophysiologist: Will Gladis Norton, MD      History of Present Illness:   [name] is a 78 y.o. male with h/o AAA with recent repair, thrombocytopenia, chronic anemia, PAF, baseline conduction disease, and CAD seen today for post hospital follow up.    Admitted 6/17 - 6/23-25 after presenting for outpatient repair of thoracoabdominal aneurysm with stenting of the celiac artery, SMA, left renal, and right renal artery by Dr. Serene.   Post-operative course complicated by drainage as well as asymptomatic bradycardia. BB held with improvement of rates and outpatient EP follow up made  Since discharge from hospital the patient reports doing well. Hasn't noticed any dizziness or lightheadedness. No syncope. Hasn't noticed bradycardia but doesn't usually track his HRs unless he is feeling bad. Mild dyspnea with more than mild exertion. ADLs are OK. No chest pain.   Review of systems complete and found to be negative unless listed in HPI.   EP Information / Studies Reviewed:    EKG is ordered today. Personal review as below.  EKG Interpretation Date/Time:  Thursday January 01 2024 08:42:34 EDT Ventricular Rate:  68 PR Interval:    QRS Duration:  118 QT Interval:  478 QTC Calculation: 508 R Axis:   -54  Text Interpretation: Atrial fibrillation with occasional PVCs Left axis deviation Septal infarct (cited on or before 04-Dec-2023) Prolonged QT When compared with ECG of 04-Dec-2023 05:37, Current undetermined rhythm precludes rhythm comparison, needs review Questionable change in initial forces of Anterior leads QT has lengthened Confirmed by Lesia Sharper [phone]) on 01/01/2024 9:05:27 AM    Arrhythmia/Device History No specialty comments available.   Physical Exam:   VS:  BP 130/66   Pulse 68   Ht 6' (1.829 m)   Wt 137 lb 6.4 oz (62.3 kg)    SpO2 96%   BMI 18.63 kg/m    Wt Readings from Last 3 Encounters:  01/01/24 137 lb 6.4 oz (62.3 kg)  12/22/23 138 lb 6.4 oz (62.8 kg)  12/07/23 155 lb 3.3 oz (70.4 kg)     GEN: No acute distress NECK: No JVD; No carotid bruits CARDIAC: Irregularly irregular rate and rhythm, no murmurs, rubs, gallops RESPIRATORY:  Clear to auscultation without rales, wheezing or rhonchi  ABDOMEN: Soft, non-tender, non-distended EXTREMITIES:  No edema; No deformity   ASSESSMENT AND PLAN:    Bradycardia Paroxysmal AF Tri-fascicular block PVCs EKG today shows AF with controlled rate.  He was started back on Toprol  25 mg daily at discharge.  Reviewed alarm symptoms such as profound fatigue, worsening SOB, worsening lightheadedness, or syncope.   Denies symptoms since discharge.  Suspect his rates are better in AF and he would be bradycardia in sinus.   With h/o NSVT/PVCs, will cautiously continue low dose toprol  at this time. Pt understands he may eventually require PPM. No urgent indication for pacing at this time.   Not currently on [facility] with high risk comobrdities including recent thoracoabdominal aneurysm repair 12/02/23.  Not candidate for rhythm control off OAC.   CAD No s/s ischemia Pending Gen cards follow up  PAD S/p recent procedure as above.  Follow up with vascular in place   Follow up with Dr. Norton in 3 months  Signed, Sharper Prentice Lesia, PA-C

## 2024-01-01 ENCOUNTER — Ambulatory Visit: Attending: Student | Admitting: Student

## 2024-01-01 ENCOUNTER — Encounter: Payer: Self-pay | Admitting: Student

## 2024-01-01 VITALS — BP 130/66 | HR 68 | Ht 72.0 in | Wt 137.4 lb

## 2024-01-01 DIAGNOSIS — I714 Abdominal aortic aneurysm, without rupture, unspecified: Secondary | ICD-10-CM

## 2024-01-01 DIAGNOSIS — R001 Bradycardia, unspecified: Secondary | ICD-10-CM

## 2024-01-01 DIAGNOSIS — I739 Peripheral vascular disease, unspecified: Secondary | ICD-10-CM

## 2024-01-01 DIAGNOSIS — Z8719 Personal history of other diseases of the digestive system: Secondary | ICD-10-CM

## 2024-01-01 DIAGNOSIS — I452 Bifascicular block: Secondary | ICD-10-CM

## 2024-01-01 DIAGNOSIS — I2510 Atherosclerotic heart disease of native coronary artery without angina pectoris: Secondary | ICD-10-CM

## 2024-01-01 NOTE — Patient Instructions (Signed)
 Medication Instructions:  Your physician recommends that you continue on your current medications as directed. Please refer to the Current Medication list given to you today.  *If you need a refill on your cardiac medications before your next appointment, please call your pharmacy*  Lab Work: None ordered If you have labs (blood work) drawn today and your tests are completely normal, you will receive your results only by: MyChart Message (if you have MyChart) OR A paper copy in the mail If you have any lab test that is abnormal or we need to change your treatment, we will call you to review the results.  Follow-Up: At [facility], you and your health needs are our priority.  As part of our continuing mission to provide you with exceptional heart care, our providers are all part of one team.  This team includes your primary Cardiologist (physician) and Advanced Practice Providers or APPs (Physician Assistants and Nurse Practitioners) who all work together to provide you with the care you need, when you need it.  Your next appointment:   3-4 month(s)  Provider:   Soyla Norton, MD    We recommend signing up for the patient portal called MyChart.  Sign up information is provided on this After Visit Summary.  MyChart is used to connect with patients for Virtual Visits (Telemedicine).  Patients are able to view lab/test results, encounter notes, upcoming appointments, etc.  Non-urgent messages can be sent to your provider as well.   To learn more about what you can do with MyChart, go to [email or web address].

## 2024-01-06 ENCOUNTER — Other Ambulatory Visit: Payer: Self-pay | Admitting: Student in an Organized Health Care Education/Training Program

## 2024-01-06 DIAGNOSIS — I10 Essential (primary) hypertension: Secondary | ICD-10-CM

## 2024-01-09 ENCOUNTER — Ambulatory Visit
Admission: RE | Admit: 2024-01-09 | Discharge: 2024-01-09 | Disposition: A | Discharge status: Home / Self Care | Source: Ambulatory Visit | Attending: Surgery | Admitting: Surgery

## 2024-01-09 DIAGNOSIS — Z9889 Other specified postprocedural states: Secondary | ICD-10-CM

## 2024-01-09 DIAGNOSIS — I714 Abdominal aortic aneurysm, without rupture, unspecified: Secondary | ICD-10-CM

## 2024-01-09 MED ORDER — IOPAMIDOL (ISOVUE-370) INJECTION 76%
80.0000 mL | Freq: Once | INTRAVENOUS | Status: AC | PRN
  Administered 2024-01-09: 80 mL via INTRAVENOUS

## 2024-01-12 ENCOUNTER — Other Ambulatory Visit: Payer: Self-pay | Admitting: Internal Medicine

## 2024-01-12 DIAGNOSIS — M25552 Pain in left hip: Secondary | ICD-10-CM

## 2024-01-12 DIAGNOSIS — Z79891 Long term (current) use of opiate analgesic: Secondary | ICD-10-CM

## 2024-01-12 NOTE — Telephone Encounter (Unsigned)
 Copied from CRM [id number]. Topic: Clinical - Medication Refill >> Jan 12, 2024 10:51 AM Susanna ORN wrote: Medication: HYDROcodone -acetaminophen  (NORCO) 7.5-325 MG tablet  Has the patient contacted their pharmacy? No (Agent: If no, request that the patient contact the pharmacy for the refill. If patient does not wish to contact the pharmacy document the reason why and proceed with request.) (Agent: If yes, when and what did the pharmacy advise?)  This is the patient's preferred pharmacy:  [id number] - [place], [place] - [address] AT [facility] [address] [place] [address] Phone: [phone] Fax: [phone]  Is this the correct pharmacy for this prescription? Yes If no, delete pharmacy and type the correct one.   Has the prescription been filled recently? Yes  Is the patient out of the medication? Yes  Has the patient been seen for an appointment in the last year OR does the patient have an upcoming appointment? Yes  Can we respond through MyChart? No  Agent: Please be advised that Rx refills may take up to 3 business days. We ask that you follow-up with your pharmacy.

## 2024-01-13 ENCOUNTER — Other Ambulatory Visit: Payer: Self-pay | Admitting: Internal Medicine

## 2024-01-13 MED ORDER — HYDROCODONE-ACETAMINOPHEN 7.5-325 MG PO TABS
1.0000 | ORAL_TABLET | Freq: Two times a day (BID) | ORAL | 0 refills | Status: DC | PRN
Start: 2024-01-13 — End: 2024-02-09

## 2024-01-13 NOTE — Telephone Encounter (Signed)
 Medication discontinued 08/04/23

## 2024-01-19 ENCOUNTER — Encounter: Admitting: Surgery

## 2024-01-21 ENCOUNTER — Other Ambulatory Visit: Payer: Self-pay | Admitting: Student in an Organized Health Care Education/Training Program

## 2024-01-21 DIAGNOSIS — I10 Essential (primary) hypertension: Secondary | ICD-10-CM

## 2024-01-22 ENCOUNTER — Other Ambulatory Visit: Payer: Self-pay | Admitting: Student in an Organized Health Care Education/Training Program

## 2024-01-22 DIAGNOSIS — I10 Essential (primary) hypertension: Secondary | ICD-10-CM

## 2024-01-23 ENCOUNTER — Other Ambulatory Visit: Payer: Self-pay | Admitting: Internal Medicine

## 2024-01-23 DIAGNOSIS — I10 Essential (primary) hypertension: Secondary | ICD-10-CM

## 2024-01-23 MED ORDER — SACUBITRIL-VALSARTAN 97-103 MG PO TABS: 1.0000 | ORAL_TABLET | Freq: Two times a day (BID) | ORAL | 3 refills | Status: DC

## 2024-01-23 NOTE — Telephone Encounter (Addendum)
 I called [name] to schedule a f/u visit; [name] is requesting to mail it to him. Dr Athena schedule is not available at this time.

## 2024-01-23 NOTE — Telephone Encounter (Signed)
 Copied from CRM #[id number]. Topic: Clinical - Medication Refill >> Jan 23, 2024  9:54 AM [name] wrote: Medication: sacubitril -valsartan  (ENTRESTO ) 97-103 MG   Has the patient contacted their pharmacy? Yes (Agent: If no, request that the patient contact the pharmacy for the refill. If patient does not wish to contact the pharmacy document the reason why and proceed with request.) (Agent: If yes, when and what did the pharmacy advise?)  This is the patient's preferred pharmacy:  [id number] [place], [place] - [address] AT [facility] [address] [place] [address] Phone: [phone] Fax: [phone]    Is this the correct pharmacy for this prescription? Yes If no, delete pharmacy and type the correct one.   Has the prescription been filled recently? Yes  Is the patient out of the medication? Yes  Has the patient been seen for an appointment in the last year OR does the patient have an upcoming appointment? Yes  Can we respond through MyChart? No   Agent: Please be advised that Rx refills may take up to 3 business days. We ask that you follow-up with your pharmacy.

## 2024-01-26 ENCOUNTER — Encounter: Payer: Self-pay | Admitting: Surgery

## 2024-01-26 ENCOUNTER — Ambulatory Visit: Attending: Surgery | Admitting: Surgery

## 2024-01-26 VITALS — BP 144/80 | HR 87 | Temp 98.1°F | Resp 20 | Ht 72.0 in | Wt 137.1 lb

## 2024-01-26 DIAGNOSIS — I7142 Juxtarenal abdominal aortic aneurysm, without rupture: Secondary | ICD-10-CM

## 2024-01-26 NOTE — Telephone Encounter (Signed)
 Entresto  was refilled 8/8/ and sent to [facility]. I called pt to f/u - no answer; mailbox is full, unable to leave a message.

## 2024-01-26 NOTE — Progress Notes (Signed)
 Patient name: [name] MRN: [id number] DOB: 24-Aug-1945 Sex: male  REASON FOR VISIT:     Post op  HISTORY OF PRESENT ILLNESS:   [name] is a 78 y.o. male who underwent a fenestrated juxtarenal abdominal aortic aneurysm repair with stenting of his left and right renal arteries on 08/16/2022.  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  His postoperative CT scan shows that his graft is in good position with no evidence of endoleak.  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  I originally scheduled him for angiography but after reviewing this with several colleagues, I felt that this could not be treated with stenting of his SMA but rather a redo 4 vessel hand made device.  This was preformed on 12-02-2023.  He was discharged home on 1 postop day #5.  He did receive blood postoperatively and had a right groin hematoma.  He also had a symptomatic bradycardia     He has a history of a neoplasm of the ampulla of Vater, lung cancer as well as prostate adenocarcinoma.  He has a suprapubic catheter in place.  He suffers from protein calorie malnutrition as well as weight loss.  He has a history of coronary artery disease, status post PCI.   CURRENT MEDICATIONS:    Current Outpatient Medications  Medication Sig Dispense Refill   albuterol  (VENTOLIN  HFA) 108 (90 Base) MCG/ACT inhaler Inhale 1-2 puffs into the lungs every 6 (six) hours as needed for wheezing or shortness of breath. 8.5 each 2   amLODipine  (NORVASC ) 10 MG tablet Take 1 tablet (10 mg total) by mouth daily. 90 tablet 3   aspirin  EC 81 MG tablet Take 1 tablet (81 mg total) by mouth daily. Swallow whole. 30 tablet 12   clopidogrel  (PLAVIX ) 75 MG tablet Take 1 tablet (75 mg total) by mouth daily. 90 tablet 1   escitalopram  (LEXAPRO ) 5 MG tablet TAKE 1 TABLET (5 MG TOTAL) BY MOUTH DAILY. 90 tablet 1   HYDROcodone -acetaminophen   (NORCO) 7.5-325 MG tablet Take 1 tablet by mouth 2 (two) times daily as needed for moderate pain (pain score 4-6). Fill 30 days after last fill. 60 tablet 0   metoprolol  succinate (TOPROL -XL) 25 MG 24 hr tablet Take 1 tablet (25 mg total) by mouth daily. 90 tablet 0   mirtazapine  (REMERON ) 7.5 MG tablet Take 2 tablets (15 mg total) by mouth at bedtime. 180 tablet 0   Pancrelipase , Lip-Prot-Amyl, (ZENPEP ) 20000-63000 units CPEP Take 3 capsules by mouth 3 (three) times daily before meals. 270 capsule 0   pantoprazole  (PROTONIX ) 40 MG tablet TAKE 1 TABLET EVERY DAY FOR REFLUX 90 tablet 3   rosuvastatin  (CRESTOR ) 40 MG tablet Take 1 tablet (40 mg total) by mouth daily. 90 tablet 3   sacubitril -valsartan  (ENTRESTO ) 97-103 MG Take 1 tablet by mouth 2 (two) times daily. 60 tablet 3   senna (SENOKOT) 8.6 MG TABS tablet Take 2 tablets (17.2 mg total) by mouth in the morning and at bedtime. 120 tablet 1   Tiotropium Bromide Monohydrate  (SPIRIVA  RESPIMAT) 2.5 MCG/ACT AERS Inhale 2 puffs into the lungs daily. 4 g 2   No current facility-administered medications for this visit.    REVIEW OF SYSTEMS:   [X]  denotes positive finding, [ ]  denotes negative finding Cardiac  Comments:  Chest pain or chest pressure:    Shortness of breath upon exertion: x   Short  of breath when lying flat: x   Irregular heart rhythm:    Constitutional    Fever or chills:      PHYSICAL EXAM:   Vitals:   01/26/24 0907  BP: (!) 144/80  Pulse: 87  Resp: 20  Temp: 98.1 F (36.7 C)  TempSrc: Temporal  SpO2: 97%  Weight: 137 lb 1.6 oz (62.2 kg)  Height: 6' (1.829 m)    GENERAL: The patient is a well-nourished male, in no acute distress. The vital signs are documented above. CARDIOVASCULAR: There is a regular rate and rhythm. PULMONARY: Non-labored respirations Both groins were soft. Abdomen without pulsatility STUDIES:   CTA:  VASCULAR   1. Similar appearing postsurgical changes after  fenestrated aortobi-iliac endograft repair of abdominal aortic aneurysm. Resolution of previously visualized endoleak 2. Despite resolution of previously visualized complex juxtarenal endoleak, there is interval increased size of the infrarenal fusiform aneurysm sac which measures up to 6.0 cm, previously 5.0 cm by my measurements. 3. 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 Recommend annual imaging followup by CTA or MRA. This recommendation follows 2010 ACCF/AHA/AATS/ACR/ASA/SCA/SCAI/SIR/STS/SVM Guidelines for the Diagnosis and Management of Patients with Thoracic Aortic Disease. Circulation. 2010; 121: Z733-z630. Aortic aneurysm NOS (ICD10-I71.9) 4.  Aortic Atherosclerosis (ICD10-I70.0).   NON-VASCULAR   1. Unchanged severe emphysema (ICD10-J43.9). 2. Unchanged cholelithiasis. 3. Unchanged nonobstructive left nephrolithiasis. MEDICAL ISSUES:   AAA: The patient has undergone ZFEN and then redo four-vessel branched graft to treat a type Ia endoleak.  CT scan shows the graft is in good position.  There has been increase in the size of his aneurysm however I do not see an endoleak.  I will evaluate this again in 6 months with a repeat CTA.  I will also get a carotid ultrasound as he has never had this done.  Malvina Serene CLORE, MD, FACS [facility] [phone] Pager [phone]

## 2024-01-26 NOTE — Telephone Encounter (Unsigned)
 Copied from CRM #[id number]. Topic: Clinical - Medication Refill >> Jan 23, 2024  9:54 AM [name] wrote: Medication: sacubitril -valsartan  (ENTRESTO ) 97-103 MG   Has the patient contacted their pharmacy? Yes (Agent: If no, request that the patient contact the pharmacy for the refill. If patient does not wish to contact the pharmacy document the reason why and proceed with request.) (Agent: If yes, when and what did the pharmacy advise?)  This is the patient's preferred pharmacy:  [id number] [place], [place] - [address] AT [facility] [address] [place] [address] Phone: [phone] Fax: [phone]    Is this the correct pharmacy for this prescription? Yes If no, delete pharmacy and type the correct one.   Has the prescription been filled recently? Yes  Is the patient out of the medication? Yes  Has the patient been seen for an appointment in the last year OR does the patient have an upcoming appointment? Yes  Can we respond through MyChart? No   Agent: Please be advised that Rx refills may take up to 3 business days. We ask that you follow-up with your pharmacy. >> Jan 23, 2024  4:24 PM Mercer PEDLAR wrote: Patient calling to check status of refill. [facility]. I informed him that it may take up to 3 business days for refills to be processed.

## 2024-02-04 ENCOUNTER — Telehealth: Payer: Self-pay | Admitting: *Deleted

## 2024-02-04 NOTE — Telephone Encounter (Signed)
 Return call to [facility]. Stated pt has a suprapubic catheter; which was changed lately by the NP,  it had a lot of sediment present and  the NP ordered an abx. Nena stated she instructed the pt to use the drainage bag at night and leg bag during the day when he's up and about (in which he has not been doing). Requesting verbal order To continue monthly suprapubic catheter changes. VO given - sending to PCP.

## 2024-02-04 NOTE — Telephone Encounter (Signed)
Agree with VO

## 2024-02-04 NOTE — Telephone Encounter (Signed)
 Copied from CRM #[id number]. Topic: Clinical - Home Health Verbal Orders >> Feb 04, 2024  2:39 PM Diannia H wrote: Caller/Agency: Nena from [facility] Callback Number: [phone] Service Requested: Skilled Nursing Frequency: Monthly Cath change Any new concerns about the patient? No

## 2024-02-09 ENCOUNTER — Other Ambulatory Visit: Payer: Self-pay | Admitting: Internal Medicine

## 2024-02-09 DIAGNOSIS — Z79891 Long term (current) use of opiate analgesic: Secondary | ICD-10-CM

## 2024-02-09 DIAGNOSIS — M25551 Pain in right hip: Secondary | ICD-10-CM

## 2024-02-09 NOTE — Telephone Encounter (Unsigned)
 Copied from CRM [id number]. Topic: Clinical - Medication Refill >> Feb 09, 2024  1:27 PM Hamdi H wrote: Medication: HYDROcodone -acetaminophen  (NORCO) 7.5-325 MG tablet  Has the patient contacted their pharmacy? No (Agent: If no, request that the patient contact the pharmacy for the refill. If patient does not wish to contact the pharmacy document the reason why and proceed with request.) (Agent: If yes, when and what did the pharmacy advise?) Controlled substance   This is the patient's preferred pharmacy:  [id number] - [place], [place] - [address] AT [facility] [address] [place] [address] Phone: [phone] Fax: [phone]  Is this the correct pharmacy for this prescription? Yes If no, delete pharmacy and type the correct one.   Has the prescription been filled recently? No  Is the patient out of the medication? No  Has the patient been seen for an appointment in the last year OR does the patient have an upcoming appointment? Yes  Can we respond through MyChart? No  Agent: Please be advised that Rx refills may take up to 3 business days. We ask that you follow-up with your pharmacy.

## 2024-02-10 MED ORDER — HYDROCODONE-ACETAMINOPHEN 7.5-325 MG PO TABS
1.0000 | ORAL_TABLET | Freq: Two times a day (BID) | ORAL | 0 refills | Status: DC | PRN
Start: 2024-02-10 — End: 2024-03-12

## 2024-02-27 ENCOUNTER — Other Ambulatory Visit: Payer: Self-pay

## 2024-02-27 ENCOUNTER — Ambulatory Visit

## 2024-02-27 VITALS — BP 148/73 | HR 76 | Temp 98.3°F | Ht 72.0 in | Wt 137.2 lb

## 2024-02-27 DIAGNOSIS — Z23 Encounter for immunization: Secondary | ICD-10-CM | POA: Diagnosis not present

## 2024-02-27 DIAGNOSIS — J439 Emphysema, unspecified: Secondary | ICD-10-CM

## 2024-02-27 DIAGNOSIS — F17210 Nicotine dependence, cigarettes, uncomplicated: Secondary | ICD-10-CM | POA: Diagnosis not present

## 2024-02-27 MED ORDER — STIOLTO RESPIMAT 2.5-2.5 MCG/ACT IN AERS: 2.0000 | INHALATION_SPRAY | Freq: Every day | RESPIRATORY_TRACT | 3 refills | Status: AC

## 2024-02-27 MED ORDER — NICOTINE POLACRILEX 4 MG MT LOZG: 4.0000 mg | LOZENGE | OROMUCOSAL | 0 refills | Status: DC | PRN

## 2024-02-27 MED ORDER — NICOTINE 21 MG/24HR TD PT24: 21.0000 mg | MEDICATED_PATCH | Freq: Every day | TRANSDERMAL | 0 refills | Status: DC

## 2024-02-27 MED ORDER — STIOLTO RESPIMAT 2.5-2.5 MCG/ACT IN AERS
2.0000 | INHALATION_SPRAY | Freq: Every day | RESPIRATORY_TRACT | Status: DC
Start: 2024-02-27 — End: 2024-03-27

## 2024-02-27 NOTE — Progress Notes (Signed)
 Subjective:   PATIENT ID: [name] Her GENDER: male DOB: 1946/05/14, MRN: [id number]   HPI 78 year old male with a past medical history of tobacco use disorder, history of emphysema, history of stage I squamous cell lung cancer that was surgically resected, coronary artery disease, Hypertension, history of abdominal aortic aneurysm, A-fib not on anticoagulation.  Most recent CT chest in 2025 showing evidence of upper lobe predominant emphysema.  No nodules noted.  No PFTs on file.  COPD inhaler regimen currently with Spiriva  Respimat and albuterol  rescue.  Per chart review last COPD exacerbation in November 2024.  Today he states that his main complaint is shortness of breath on exertion.  He denies any worsening cough or sputum production.  He denies any hemoptysis.  He denies any new onset fevers, chills or night sweats.  He states that he quit drinking alcohol and was difficult and he is trying to quit cigarettes now.  He states that he is getting his appetite back but his weight has been stable.  Past Medical History:  Diagnosis Date   AAA (abdominal aortic aneurysm) (HCC)    followed by vasc surgery   Alcohol abuse    Barretts esophagus    bx distal esophagus 2011 neg    Bladder outlet obstruction 04/02/2018   CAD in native artery    a. s/p stent to prox RCA and mid Cx.   Cataract    Cholelithiasis    Chronic osteomyelitis involving ankle and foot, right (HCC) 07/13/2018   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   2006 - 3 adenomas largest 12 mm  2008 - no polyps 2013-  2 mm cecal polyp, adenoma, routine repeat colonoscopy 02/2017 approx 05/14/2017 18 polyps max 12 mm ALL adenomas recall 2019 [name], MD, [facility]      Dieulafoy lesion of duodenum    Diverticulosis of colon    DJD (degenerative joint disease)    left hip   Full dentures    upper and lower   GERD (gastroesophageal reflux disease)    diet controlled    Gout    feet   Hiatal hernia    2 cm noted  on upper endos 2011    Hx of adenomatous colonic polyps    Hyperlipidemia    Hypertension    Internal hemorrhoid    Lung cancer (HCC)    squamous cell (T2N0MX), 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 pt was on Plavix  x 3 years then transitioned to ASA, no problems since   Non-small cell carcinoma of right lung, stage 1 (HCC) 01/30/2016   Renal failure, acute (HCC)    hx of 2/2 medication, resolved   Tobacco abuse    smoker .5 ppd x 55 yrs   UTI (lower urinary tract infection)      Family History  Problem Relation Age of Onset   Diabetes Mother    Hypertension Sister    Heart disease Sister    Diabetes Sister    Arthritis Brother    Gout Brother    Colon cancer Neg Hx    Esophageal cancer Neg Hx    Rectal cancer Neg Hx    Stomach cancer Neg Hx    Colon polyps Neg Hx    Inflammatory bowel disease Neg Hx    Liver disease Neg Hx    Pancreatic cancer Neg Hx      Social History   Socioeconomic  History   Marital status: Single    Spouse name: Not on file   Number of children: 0   Years of education: Not on file   Highest education level: Not on file  Occupational History   Not on file  Tobacco Use   Smoking status: Every Day    Current packs/day: 0.75    Average packs/day: 0.8 packs/day for 55.0 years (41.3 ttl pk-yrs)    Types: Cigarettes   Smokeless tobacco: Never   Tobacco comments:    Smokes 8-9 cigarettes per day  Vaping Use   Vaping status: Never Used  Substance and Sexual Activity   Alcohol use: Not Currently   Drug use: No   Sexual activity: Not on file  Other Topics Concern   Not on file  Social History Narrative   Patient is single reports no children   Smoker, no current alcohol or drug use   Social Drivers of Corporate investment banker Strain: Low Risk  (11/26/2023)   Overall Financial Resource Strain (CARDIA)    Difficulty of Paying Living Expenses: Not hard at all  Food Insecurity:  No Food Insecurity (12/03/2023)   Hunger Vital Sign    Worried About Running Out of Food in the Last Year: Never true    Ran Out of Food in the Last Year: Never true  Transportation Needs: No Transportation Needs (12/03/2023)   PRAPARE - Administrator, Civil Service (Medical): No    Lack of Transportation (Non-Medical): No  Physical Activity: Inactive (11/26/2023)   Exercise Vital Sign    Days of Exercise per Week: 0 days    Minutes of Exercise per Session: 0 min  Stress: No Stress Concern Present (11/26/2023)   [employer] of Occupational Health - Occupational Stress Questionnaire    Feeling of Stress : Not at all  Social Connections: Moderately Isolated (12/03/2023)   Social Connection and Isolation Panel    Frequency of Communication with Friends and Family: More than three times a week    Frequency of Social Gatherings with Friends and Family: Three times a week    Attends Religious Services: More than 4 times per year    Active Member of Clubs or Organizations: No    Attends Banker Meetings: Never    Marital Status: Divorced  Catering manager Violence: Not At Risk (12/03/2023)   Humiliation, Afraid, Rape, and Kick questionnaire    Fear of Current or Ex-Partner: No    Emotionally Abused: No    Physically Abused: No    Sexually Abused: No     Allergies  Allergen Reactions   Candesartan Cilexetil-Hctz Other (See Comments)    Acute renal failure   Hydrochlorothiazide Other (See Comments)    Acute renal failure   Shellfish Allergy Other (See Comments)    Gout occurs   Betadine [Povidone Iodine ] Itching     Outpatient Medications Prior to Visit  Medication Sig Dispense Refill   albuterol  (VENTOLIN  HFA) 108 (90 Base) MCG/ACT inhaler Inhale 1-2 puffs into the lungs every 6 (six) hours as needed for wheezing or shortness of breath. 8.5 each 2   amLODipine  (NORVASC ) 10 MG tablet Take 1 tablet (10 mg total) by mouth daily. 90 tablet 3   aspirin  EC 81  MG tablet Take 1 tablet (81 mg total) by mouth daily. Swallow whole. 30 tablet 12   clopidogrel  (PLAVIX ) 75 MG tablet Take 1 tablet (75 mg total) by mouth daily. 90 tablet 1   escitalopram  (  LEXAPRO ) 5 MG tablet TAKE 1 TABLET (5 MG TOTAL) BY MOUTH DAILY. 90 tablet 1   HYDROcodone -acetaminophen  (NORCO) 7.5-325 MG tablet Take 1 tablet by mouth 2 (two) times daily as needed for moderate pain (pain score 4-6). Fill 30 days after last fill. 60 tablet 0   metoprolol  succinate (TOPROL -XL) 25 MG 24 hr tablet Take 1 tablet (25 mg total) by mouth daily. 90 tablet 0   mirtazapine  (REMERON ) 7.5 MG tablet Take 2 tablets (15 mg total) by mouth at bedtime. 180 tablet 0   Pancrelipase , Lip-Prot-Amyl, (ZENPEP ) 20000-63000 units CPEP Take 3 capsules by mouth 3 (three) times daily before meals. 270 capsule 0   pantoprazole  (PROTONIX ) 40 MG tablet TAKE 1 TABLET EVERY DAY FOR REFLUX 90 tablet 3   rosuvastatin  (CRESTOR ) 40 MG tablet Take 1 tablet (40 mg total) by mouth daily. 90 tablet 3   sacubitril -valsartan  (ENTRESTO ) 97-103 MG Take 1 tablet by mouth 2 (two) times daily. 60 tablet 3   senna (SENOKOT) 8.6 MG TABS tablet Take 2 tablets (17.2 mg total) by mouth in the morning and at bedtime. 120 tablet 1   Tiotropium Bromide Monohydrate  (SPIRIVA  RESPIMAT) 2.5 MCG/ACT AERS Inhale 2 puffs into the lungs daily. 4 g 2   No facility-administered medications prior to visit.    ROS Reviewed all systems and reported negative except as above     Objective:  There were no vitals filed for this visit.  Physical Exam General: Elderly male not in acute distress Heart: Irregular rhythm, systolic murmur Chest: Clear to auscultation, distant breath sounds Abdomen: Soft, nontender Neuro: Grossly intact Extremities: Trace edema with chronic venous stasis changes    CBC    Component Value Date/Time   WBC 6.2 12/22/2023 1028   WBC 6.6 12/08/2023 0359   RBC 2.98 (L) 12/22/2023 1028   RBC 2.78 (L) 12/08/2023 0359   HGB  9.1 (L) 12/22/2023 1028   HGB 13.7 05/06/2017 0926   HCT 28.4 (L) 12/22/2023 1028   HCT 41.9 05/06/2017 0926   PLT 129 (L) 12/22/2023 1028   MCV 95 12/22/2023 1028   MCV 100.7 (H) 05/06/2017 0926   MCH 30.5 12/22/2023 1028   MCH 29.9 12/08/2023 0359   MCHC 32.0 12/22/2023 1028   MCHC 32.3 12/08/2023 0359   RDW 15.8 (H) 12/22/2023 1028   RDW 14.5 05/06/2017 0926   LYMPHSABS 0.9 10/29/2023 1924   LYMPHSABS 1.1 09/23/2022 1032   LYMPHSABS 1.0 05/06/2017 0926   MONOABS 0.4 10/29/2023 1924   MONOABS 0.4 05/06/2017 0926   EOSABS 0.1 10/29/2023 1924   EOSABS 0.1 09/23/2022 1032   BASOSABS 0.0 10/29/2023 1924   BASOSABS 0.0 09/23/2022 1032   BASOSABS 0.0 05/06/2017 0926     Chest imaging: I personally reviewed the CT chest performed on 01/09/2024 showing evidence of upper lobe predominant emphysema.  No nodules noted.   PFT: No PFTs on file    Labs: Labs from July with mild elevation in kidney function   Echo:       Assessment & Plan:   Assessment & Plan Pulmonary emphysema, unspecified emphysema type [facility]) Patient with pulmonary emphysema.  Last exacerbation in November 2024.  No PFTs on file.  CT chest with no nodules but showing evidence of upper lobe predominant emphysema.  He has been complaining of worsening shortness of breath and dyspnea on exertion.  No evidence of exacerbation at this time.  Stable cough with minimal mucus production.  - Flu shot today -Upgrade inhaler from Spiriva  Respimat  to Stiolto Respimat . (Indicated given prior exacerbation in 2024 to be on LABA/LAMA) - Plan to obtain PFTs this will help us  with getting authorization to refer him for pulmonary rehab -Advised to quit smoking.  Smoking counseling provided.  Patient currently smoking 5 to 6 cigarettes/day.  He is interested in quitting.  He is interested in trying nicotine  lozenges and patches. Need for immunization against influenza   No orders of the defined types were placed in this  encounter.     Zola Herter, MD [facility] Office: [phone]

## 2024-02-27 NOTE — Patient Instructions (Addendum)
 It was nice meeting you in the clinic today  Please stop smoking cigarettes. [facility] provides free cessation services to any [name]  resident. Get free tobacco cessation help 24/7 Call  1-800-QUIT-NOW (580-476-3794)  Start taking your new inhaler stiolto 2 puffs in the morning and continue to use albuterol  whenever you need it   Stop taking Spiriva    We will get breathing tests for you. We gave you a flu shot today  I will see you back in the clinic in 3 months

## 2024-02-27 NOTE — Assessment & Plan Note (Signed)
 Patient with pulmonary emphysema.  Last exacerbation in November 2024.  No PFTs on file.  CT chest with no nodules but showing evidence of upper lobe predominant emphysema.  He has been complaining of worsening shortness of breath and dyspnea on exertion.  No evidence of exacerbation at this time.  Stable cough with minimal mucus production.  - Flu shot today -Upgrade inhaler from Spiriva  Respimat to Stiolto Respimat . (Indicated given prior exacerbation in 2024 to be on LABA/LAMA) - Plan to obtain PFTs this will help us  with getting authorization to refer him for pulmonary rehab -Advised to quit smoking.  Smoking counseling provided.  Patient currently smoking 5 to 6 cigarettes/day.  He is interested in quitting.  He is interested in trying nicotine  lozenges and patches.

## 2024-03-04 NOTE — Telephone Encounter (Unsigned)
 Copied from CRM [id number]. Topic: Clinical - Medication Refill >> Mar 04, 2024  8:36 AM Carrielelia G wrote: Medication: Pancrelipase , Lip-Prot-Amyl, (ZENPEP ) 20000-63000 units CPEP  Has the patient contacted their pharmacy? No (Agent: If no, request that the patient contact the pharmacy for the refill. If patient does not wish to contact the pharmacy document the reason why and proceed with request.) (Agent: If yes, when and what did the pharmacy advise?)  This is the patient's preferred pharmacy:  [id number] - [place], [place] - [address] AT [facility] [address] [place] [address] Phone: [phone] Fax: [phone]   Is this the correct pharmacy for this prescription? Yes If no, delete pharmacy and type the correct one.    Is the patient out of the medication? No  Has the patient been seen for an appointment in the last year OR does the patient have an upcoming appointment? Yes  Can we respond through MyChart? No  Agent: Please be advised that Rx refills may take up to 3 business days. We ask that you follow-up with your pharmacy.

## 2024-03-06 ENCOUNTER — Other Ambulatory Visit: Payer: Self-pay | Admitting: Student in an Organized Health Care Education/Training Program

## 2024-03-06 DIAGNOSIS — I10 Essential (primary) hypertension: Secondary | ICD-10-CM

## 2024-03-07 IMAGING — CT NM PET TUM IMG SKULL BASE T - THIGH
7 series · 25 of 25 positions shown · non-contrast
Comparison: 07/25/2021 chest CT.  Abdominopelvic CTA of 06/19/2021

CLINICAL DATA: Prostate cancer, status post TURP 05/02/2021. PSA of
17.5.

EXAM:
NUCLEAR MEDICINE PET SKULL BASE TO THIGH
TECHNIQUE: 9.3 mCi F18 Piflufolastat (Pylarify) was injected intravenously.
Full-ring PET imaging was performed from the skull base to thigh
after the radiotracer. CT data was obtained and used for attenuation
correction and anatomic localization.

[Series 3: pet sk_thigh ac · axial · 5.0mm · 4.07mm/px · z∈[-786,+214]mm · 5 of 251 slices shown]
[im 1/251]
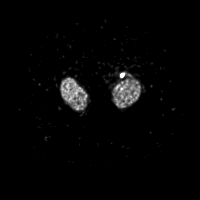
[im 63/251]
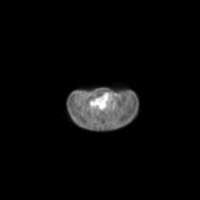
[im 126/251]
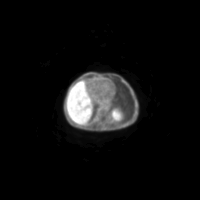
[im 188/251]
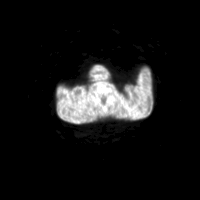
[im 251/251]
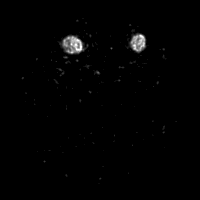

[Series 4: ct sk_thigh 5.0 br38 · axial · 5.0mm · 0.98mm/px · z∈[-786,+214]mm · 5 of 251 slices shown]
[im 1/251]
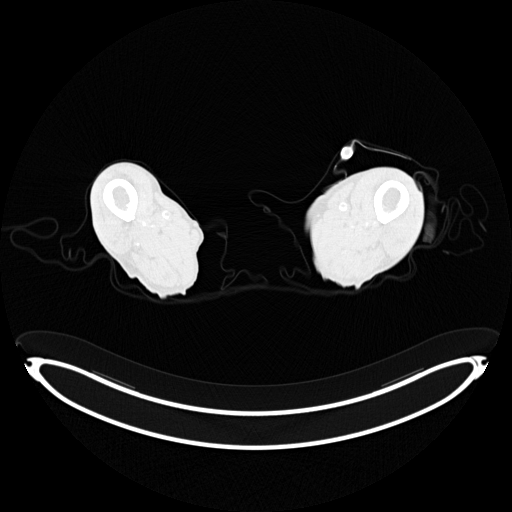
[im 63/251]
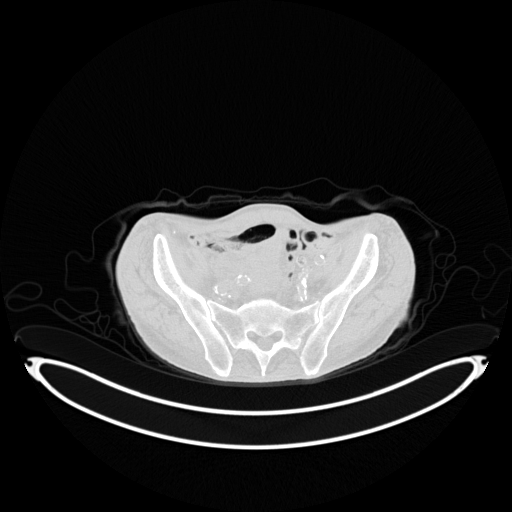
[im 126/251]
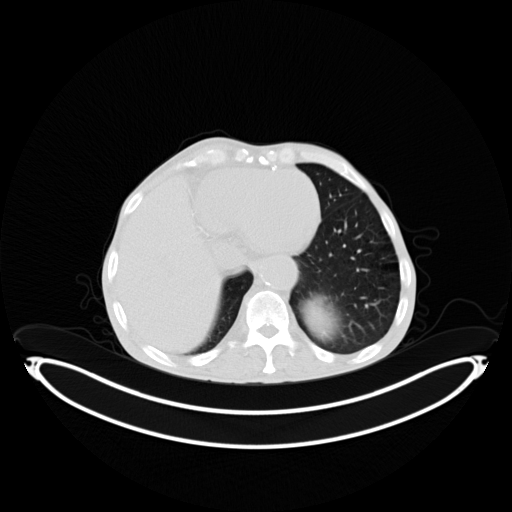
[im 188/251  brain]
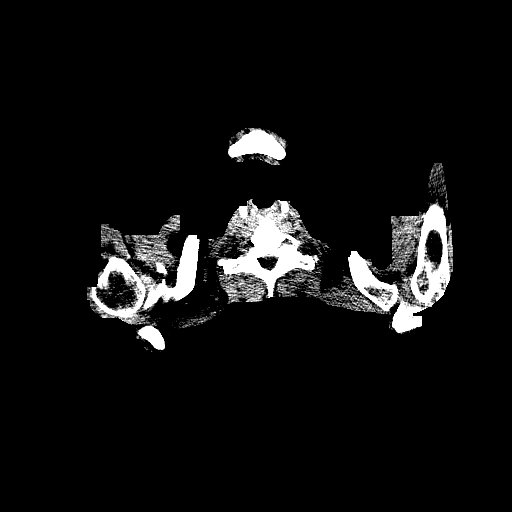
[im 251/251]
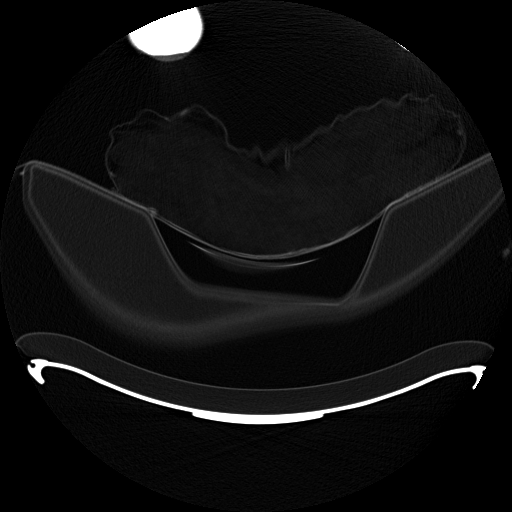

[Series 5: pet sk_thigh nac · axial · 5.0mm · 4.07mm/px · z∈[-786,+214]mm · 6 of 251 slices shown]
[im 1/251]
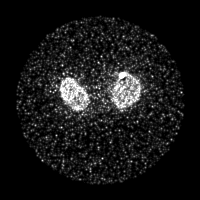
[im 51/251]
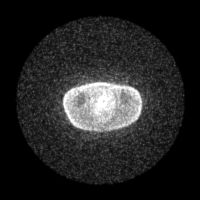
[im 101/251]
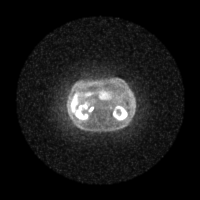
[im 151/251]
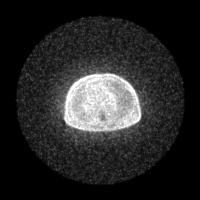
[im 201/251]
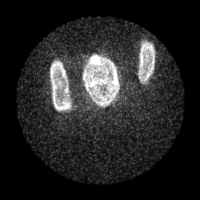
[im 251/251]
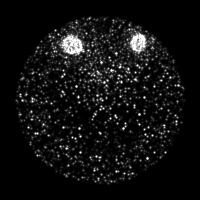

[Series 7: ct 5.0 bl57 lung_bone · axial · 5.0mm · 0.68mm/px · z∈[-365,-53]mm · 2 of 79 slices shown]
[im 1/79]
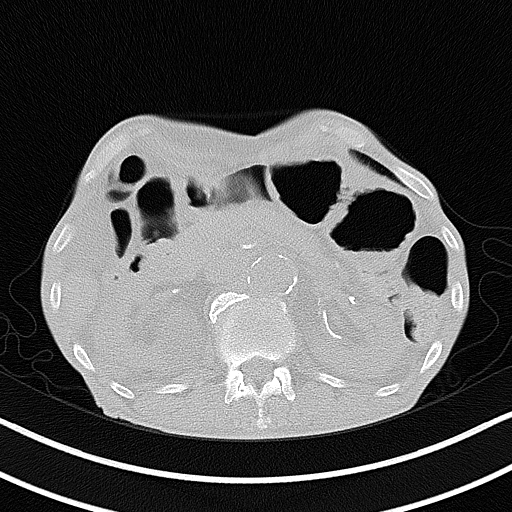
[im 79/79]
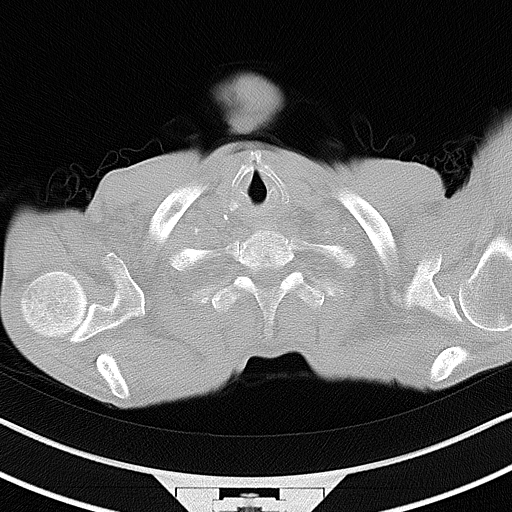

[Series 604: fused tra · 5 of 218 slices shown]
[im 1/218]
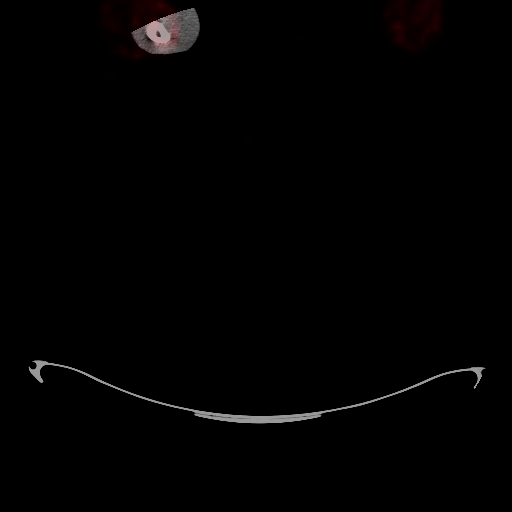
[im 55/218]
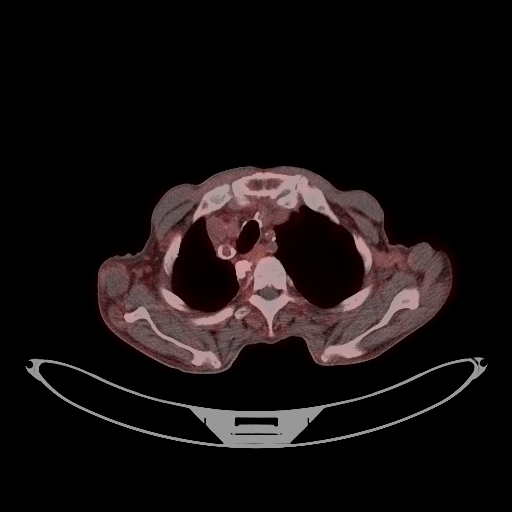
[im 109/218]
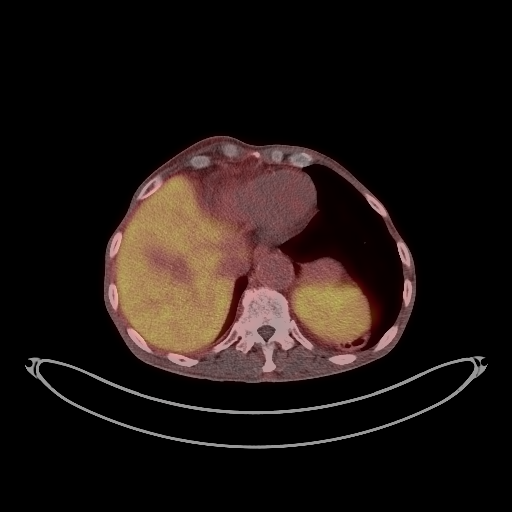
[im 163/218]
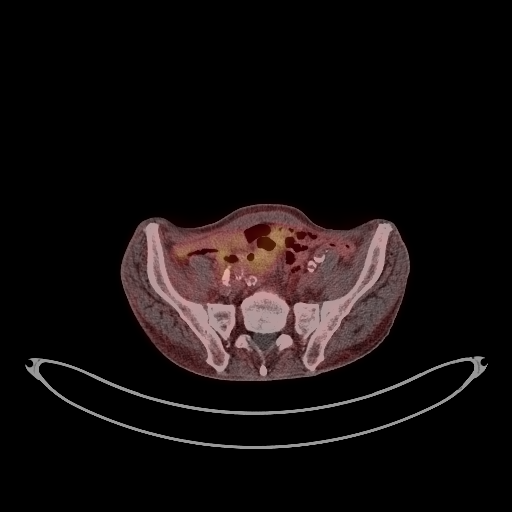
[im 218/218]
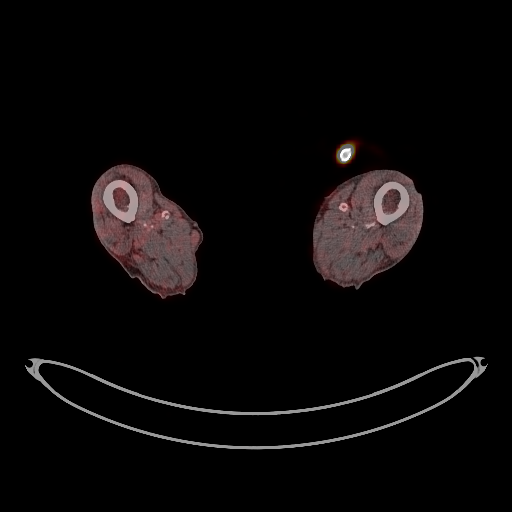

[Series 605: fused cor · 1 of 54 slices shown]
[im 1/54]
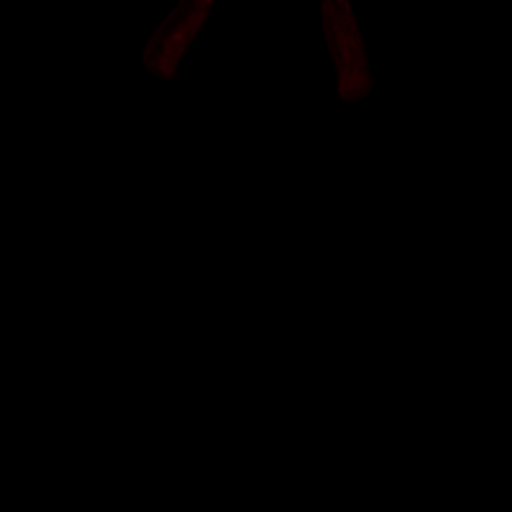

[Series 606: mip pet · coronal · 2.07mm/px · 1 of 32 slices shown]
[im 1/32]
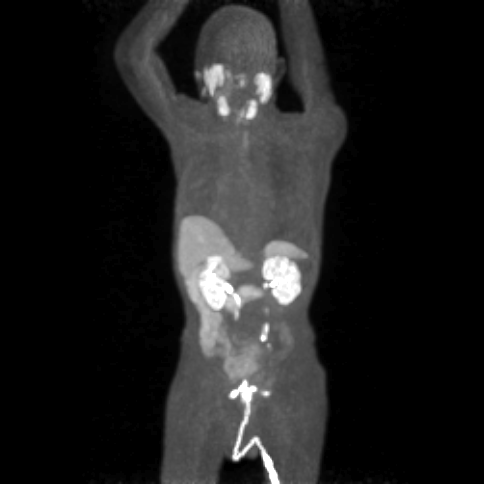

[25 of 25 positions shown; findings below may reference images not displayed]

FINDINGS: NECK

No radiotracer activity in neck lymph nodes.

Incidental CT finding: Bilateral carotid atherosclerosis.

CHEST

No tracer avid thoracic nodes or pulmonary nodules.

Incidental CT finding: Right upper lobectomy with elevation of the
right hemidiaphragm. Aortic and coronary artery calcification.
Centrilobular emphysema. Pulmonary artery enlargement.

ABDOMEN/PELVIS

Prostate: The prostate is poorly evaluated secondary to Foley
catheter in place. This results in physiologic urinary activity
within and cephalad the prostate. No dominant prostatic uptake
identified.

Lymph nodes: No abnormal radiotracer accumulation within pelvic or
abdominal nodes.

Liver: No evidence of liver metastasis

Incidental CT finding: Dependent gallstone. Mild renal cortical
thinning bilaterally. Renal lesions have been evaluated on prior
contrast enhanced CTA's.

Infrarenal abdominal aortic aneurysm including at 7.0 x 5.8 cm on
163/4. Compare 6.1 x 5.5 cm on the prior exam. Limited evaluation
for abdominopelvic adenopathy secondary to paucity of fat and the
extent of aortic aneurysm.

SKELETON

No focal  activity to suggest skeletal metastasis.

Degenerative and posttraumatic changes about the left hip. Extensive
remote right rib fractures.
IMPRESSION: 1. No evidence of tracer avid metastatic disease.
2. Enlargement of an infrarenal abdominal aortic aneurysm at 7.0 x
5.8 cm. Recommend referral to a vascular specialist. This
recommendation follows ACR consensus guidelines: White Paper of the
ACR Incidental Findings Committee II on Vascular Findings. [HOSPITAL] 4094; [DATE].
3. Incidental findings, including: Cholelithiasis. Pulmonary artery
enlargement suggests pulmonary arterial hypertension. Aortic
atherosclerosis ([id number]-[id number]), coronary artery atherosclerosis and
emphysema (XZNGK-[id number]). Prior right upper lobectomy.

## 2024-03-09 ENCOUNTER — Encounter: Payer: Self-pay | Admitting: Internal Medicine

## 2024-03-12 ENCOUNTER — Other Ambulatory Visit: Payer: Self-pay | Admitting: Internal Medicine

## 2024-03-12 DIAGNOSIS — Z79891 Long term (current) use of opiate analgesic: Secondary | ICD-10-CM

## 2024-03-12 DIAGNOSIS — M25551 Pain in right hip: Secondary | ICD-10-CM

## 2024-03-12 MED ORDER — HYDROCODONE-ACETAMINOPHEN 7.5-325 MG PO TABS: 1.0000 | ORAL_TABLET | Freq: Two times a day (BID) | ORAL | 0 refills | Status: DC | PRN

## 2024-03-12 NOTE — Telephone Encounter (Signed)
 Last rx written - 02/10/24. Last OV - 12/22/23. Next OV 0 has not been scheduled. TOX - 08/04/23.

## 2024-03-12 NOTE — Telephone Encounter (Signed)
 Copied from CRM #[id number]. Topic: Clinical - Medication Refill >> Mar 12, 2024  9:09 AM Zane F wrote: Patient is calling in to refill his prescription listed below. Patient has a few days left of the prescription.    Medication: HYDROcodone -acetaminophen  (NORCO) 7.5-325 MG tablet   Has the patient contacted their pharmacy? No, due to the nature of the medication  This is the patient's preferred pharmacy:  [id number] - [place], [place] - [address] [facility] [address] [place] [address] Phone: [phone] Fax: [phone]   Is this the correct pharmacy for this prescription? Yes   Has the prescription been filled recently? No  Is the patient out of the medication? No  Has the patient been seen for an appointment in the last year OR does the patient have an upcoming appointment? Yes  Can we respond through MyChart? No  Agent: Please be advised that Rx refills may take up to 3 business days. We ask that you follow-up with your pharmacy.

## 2024-03-15 ENCOUNTER — Ambulatory Visit: Attending: Emergency Medicine | Admitting: Emergency Medicine

## 2024-03-15 ENCOUNTER — Encounter: Payer: Self-pay | Admitting: Emergency Medicine

## 2024-03-15 VITALS — BP 122/60 | HR 82 | Ht 72.0 in | Wt 136.0 lb

## 2024-03-15 DIAGNOSIS — I480 Paroxysmal atrial fibrillation: Secondary | ICD-10-CM

## 2024-03-15 DIAGNOSIS — I10 Essential (primary) hypertension: Secondary | ICD-10-CM

## 2024-03-15 DIAGNOSIS — I5022 Chronic systolic (congestive) heart failure: Secondary | ICD-10-CM | POA: Diagnosis not present

## 2024-03-15 DIAGNOSIS — R001 Bradycardia, unspecified: Secondary | ICD-10-CM

## 2024-03-15 DIAGNOSIS — I2510 Atherosclerotic heart disease of native coronary artery without angina pectoris: Secondary | ICD-10-CM

## 2024-03-15 DIAGNOSIS — E782 Mixed hyperlipidemia: Secondary | ICD-10-CM

## 2024-03-15 DIAGNOSIS — I7142 Juxtarenal abdominal aortic aneurysm, without rupture: Secondary | ICD-10-CM

## 2024-03-15 DIAGNOSIS — I453 Trifascicular block: Secondary | ICD-10-CM

## 2024-03-15 DIAGNOSIS — J439 Emphysema, unspecified: Secondary | ICD-10-CM

## 2024-03-15 DIAGNOSIS — Z720 Tobacco use: Secondary | ICD-10-CM

## 2024-03-15 NOTE — Progress Notes (Addendum)
 " Cardiology Office Note:    Date:  03/15/2024  ID:  [name], DOB 17-Mar-1946, MRN [id number] PCP: [name], DO  [facility] Providers Cardiologist:  Stanly DELENA Leavens, MD Cardiology APP:  Rana Lum LITTIE, NP  Electrophysiologist:  Will Gladis Norton, MD       Patient Profile:       Chief Complaint: Hospital follow-up History of Present Illness:  [name] is a 78 y.o. male with visit-pertinent history of AAA with recent repair, chronic anemia, PAF, baseline conduction disease, coronary artery disease s/p RCA and LCx stents in 2004, aortic atherosclerosis, hypertension, COPD, CKD, adenoma of ampulla of Vader, tobacco abuse, history of lung cancer s/p lobectomy, Barrett's esophagus, chronic opioid abuse, alcohol abuse, prediabetes, bradycardia  Patient has history of CAD with remote NSTEMI in 2004 s/p DES to proximal RCA and staged PCI to mid LCx.  There was moderate residual disease in distal LCx.  LM, LAD were okay.  LVEF 60% at that time.  Saw Dr. Okey in office once in 2016 for follow-up of CAD.  He had NS IVCD on EKG at that time with occasional PACs.  Subsequent tracings during admissions for other issues showed degree of conduction abnormality with episodic bradycardia in 2017 and 2020.  2020 tracing showed RBBB/LAFB pattern.  He was admitted 11/06/2020 with progressive weakness, poor appetite, weight loss and constipation.  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  TTE 11/06/2021 reassuring with normal LVEF.  He was on heparin  for PVT and then  anticoagulation was later stopped as he was felt to be quarter candidate for anticoagulation.  Cardiology was consulted due to bradycardia with heart rate in 30s/40s on the monitor.  He was on beta-blocker at that time.  His nodal agent was stopped as heart rate improved.  Patient remained asymptomatic.  He was last seen outpatient by general cardiology on 04/22/2022.  He was doing well at the time without acute complaints.  At this time he was lost to cardiology follow-up.  He was admitted 05/2023 after he presented to the hospital with flash pulmonary edema.  He was IV diuresed.  Thought to be due to uncontrolled hypertension.  His BP regimen was adjusted to amlodipine  and Entresto .  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  There was low suspicion for ACS at that time and no further ischemic evaluation was pursued.  Patient was admitted 6/17 through 12/08/2023 after presenting with outpatient repair of thoracoabdominal aneurysm with stenting of the celiac artery, SMA, left renal, and right renal artery by Dr. Serene. Post-operative course complicated by drainage as well as asymptomatic bradycardia. BB held with improvement of rates and outpatient EP follow up made.  He was last seen on 01/01/2024 by EP APP.  EKG showed AF with controlled rate.  He was started back on Toprol  25 mg daily at recent discharge.  He was not currently on [facility] with high risk comorbidities including recent thoracoabdominal aneurysm repair on 12/02/2023.  Not a candidate for rhythm control off of OAC.   Discussed the use of AI scribe software for clinical note transcription with the patient, who gave verbal consent to proceed.  History of Present  Illness [name] is a 78 year old male with coronary artery disease and heart failure who presents for follow-up.  Today patient is doing well without acute cardiovascular concerns.  He denies any chest pains or new  exertional symptoms.  He experiences dyspnea during activities such as walking short distances and becomes winded after walking to a nearby friend's house.  He does have history of emphysema and continues to smoke 10 cigarettes a day.  The shortness of breath is not new to him and has not worsened.  He denies chest pain, leg swelling, weight gain, orthopnea, PND, syncope, or dizziness.   He lives independently and engages in housework and short walks but does not participate in regular exercise beyond these activities.    Review of systems:  Please see the history of present illness. All other systems are reviewed and otherwise negative.      Studies Reviewed:        Echocardiogram 06/02/2023 1. Left ventricular ejection fraction, by estimation, is 45 to 50%. The  left ventricle has mildly decreased function. The left ventricle  demonstrates regional wall motion abnormalities with basal to mid inferior  severe hypokinesis. Left ventricular  diastolic parameters are indeterminate.   2. Right ventricular systolic function is mildly reduced. The right  ventricular size is mildly enlarged. There is moderately elevated  pulmonary artery systolic pressure. The estimated right ventricular  systolic pressure is 57.6 mmHg.   3. Left atrial size was mild to moderately dilated.   4. The mitral valve is normal in structure. Mild mitral valve  regurgitation. No evidence of mitral stenosis.   5. The aortic valve is tricuspid. Aortic valve regurgitation is not  visualized. No aortic stenosis is present.   6. The inferior vena cava is normal in size with <50% respiratory  variability, suggesting right atrial pressure of 8 mmHg.   7. The patient was in atrial fibrillation.   Lexiscan  Myoview  12/19/2020 Nuclear stress EF: 47%. The left ventricular ejection fraction is mildly decreased (45-54%). There was no ST segment deviation noted during stress. No T wave inversion was noted during stress. The  study is normal. This is a low risk study.   1.  Reduced apical counts with normal wall motion consistent with apical thinning artifact.  No evidence of ischemia or infarction. 2.  Normal LVEF for this modality, 47%. 3.  This is a low risk study.  ZIO 11/29/2020 Patch Wear Time:  2 days and 4 hours (2022-06-08T15:16:41-0400 to 2022-06-10T19:18:04-399)   27 Ventricular Tachycardia runs occurred, the run with the fastest interval lasting 17.7 secs with a max rate of 174 bpm, the longest lasting 3 mins 15 secs with an avg rate of 108 bpm. Atrial Fibrillation occurred continuously (100% burden), ranging  from 37-160 bpm (avg of 81 bpm). Idioventricular Rhythm was present. Isolated VEs were rare (<1.0%, 1986), VE Couplets were rare (<1.0%, 256), and VE Triplets were rare (<1.0%, 79). Ventricular Trigeminy was present.   Echocardiogram 11/06/2020 1. Left ventricular ejection fraction, by estimation, is 55 to 60%. The  left ventricle has normal function. Left ventricular endocardial border  not optimally defined to evaluate regional wall motion. There is mild left  ventricular hypertrophy. Left  ventricular diastolic parameters are consistent with Grade I diastolic  dysfunction (impaired relaxation).   2. Right ventricular systolic function was not well visualized. The right  ventricular size is not well visualized. There is normal pulmonary artery  systolic pressure. The estimated right ventricular systolic pressure is  29.8 mmHg.   3. The mitral valve is normal in structure. No evidence of mitral valve  regurgitation. No evidence of mitral stenosis.   4. The aortic valve is tricuspid. Aortic valve regurgitation is not  visualized. No aortic stenosis is present.   5. The inferior vena cava is normal in size with greater than 50%  respiratory variability, suggesting right atrial pressure of 3 mmHg.   6. Technically difficult study with poor acoustic windows.  Risk Assessment/Calculations:     CHA2DS2-VASc Score = 5   This indicates a 7.2% annual risk of stroke. The patient's score is based upon: CHF History: 1 HTN History: 1 Diabetes History: 0 Stroke History: 0 Vascular Disease History: 1 Age Score: 2 Gender Score: 0             Physical Exam:   VS:  BP 122/60 (BP Location: Left Arm, Patient Position: Sitting, Cuff Size: Normal)   Pulse 82   Ht 6' (1.829 m)   Wt 136 lb (61.7 kg)   BMI 18.44 kg/m    Wt Readings from Last 3 Encounters:  03/15/24 136 lb (61.7 kg)  02/27/24 137 lb 3.2 oz (62.2 kg)  01/26/24 137 lb 1.6 oz (62.2 kg)    GEN: Well nourished, well developed in no acute distress NECK: No JVD; No carotid bruits CARDIAC: Irregular irregular rhythm.  No murmurs, rubs, gallops RESPIRATORY:  Clear to auscultation without rales, wheezing or rhonchi  ABDOMEN: Soft, non-tender, non-distended EXTREMITIES:  No edema; No acute deformity      Assessment and Plan:  Coronary artery disease S/p remote NSTEMI in 2004 with DES to proximal RCA and staged PCI to mid circumflex.  Moderate residual disease in left circumflex. Myoview  2022 was normal and low risk study - Today patient remains stable without anginal symptoms.  Remains fairly active without exertional chest pains.  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  No changes to chronic dyspnea on exertion - His volume status remains stable without loop diuretic therapy - No changes to current GDMT as he is well compensated - Continue Entresto  97-103 mg twice daily - Continue metoprolol  succinate 25 mg daily - Repeat  echocardiogram x 3 months to reassess LV function  Hypertension Blood pressure today well-controlled at 122/60 - Continue amlodipine  10 mg daily - Continue metoprolol  succinate 25 mg daily - Continue Entresto  97-103 mg twice daily  Hyperlipidemia, LDL goal <70 Most recent LDL of 97 on 09/2021 and not well-controlled - Plan to repeat fasting lipid panel today - Continue rosuvastatin  40 mg daily  Paroxysmal atrial fibrillation Bradycardia Trifascicular block PVCs Remote history of VT (08/2021) - Today he is in rate controlled atrial fibrillation, HR 82 bpm today - Remains cardiac unaware of arrhythmia without alarm symptoms.  No syncope - He remains fairly asymptomatic, no indication for PPM at this time - Previously not a candidate for [facility] due to high risk of bleed - Per EP he is not currently on OAC due to high risk comorbidities.  Not a candidate for rhythm control off of OAC - Management per EP  AAA s/p repair Underwent a fenestrated  juxtarenal abdominal aortic aneurysm repair with stenting of his left and right renal arteries on 08/16/2022. 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  No recent exacerbations - Continues to smoke 10 cigarettes daily - Management per pulmonology -Tobacco cessation encouraged      Dispo:  Return in about 3 months (around 06/14/2024).  Signed, Lum LITTIE Louis, NP  "

## 2024-03-15 NOTE — Patient Instructions (Signed)
 Medication Instructions:  NO CHANGES  Lab Work: FASTING LIPID PANEL AND LFTs TO BE DONE TODAY.  Testing/Procedures: Your physician has requested that you have an echocardiogram. Echocardiography is a painless test that uses sound waves to create images of your heart. It provides your doctor with information about the size and shape of your heart and how well your heart's chambers and valves are working. This procedure takes approximately one hour. There are no restrictions for this procedure. Please do NOT wear cologne, perfume, aftershave, or lotions (deodorant is allowed). Please arrive 15 minutes prior to your appointment time.  Please note: We ask at that you not bring children with you during ultrasound (echo/ vascular) testing. Due to room size and safety concerns, children are not allowed in the ultrasound rooms during exams. Our front office staff cannot provide observation of children in our lobby area while testing is being conducted. An adult accompanying a patient to their appointment will only be allowed in the ultrasound room at the discretion of the ultrasound technician under special circumstances. We apologize for any inconvenience.   Follow-Up: At [facility], you and your health needs are our priority.  As part of our continuing mission to provide you with exceptional heart care, our providers are all part of one team.  This team includes your primary Cardiologist (physician) and Advanced Practice Providers or APPs (Physician Assistants and Nurse Practitioners) who all work together to provide you with the care you need, when you need it.  Your next appointment:   3 MONTHS  Provider:   MADISON FOUNTAIN, NP  We recommend signing up for the patient portal called MyChart.  Sign up information is provided on this After Visit Summary.  MyChart is used to connect with patients for Virtual Visits (Telemedicine).  Patients are able to view lab/test results, encounter  notes, upcoming appointments, etc.  Non-urgent messages can be sent to your provider as well.   To learn more about what you can do with MyChart, go to [email or web address].

## 2024-03-16 LAB — HEPATIC FUNCTION PANEL
ALT: 35 IU/L (ref 0–44)
AST: 48 IU/L — ABNORMAL HIGH (ref 0–40)
Albumin: 3.5 g/dL — ABNORMAL LOW (ref 3.8–4.8)
Alkaline Phosphatase: 77 IU/L (ref 47–123)
Bilirubin Total: 0.4 mg/dL (ref 0.0–1.2)
Bilirubin, Direct: 0.23 mg/dL (ref 0.00–0.40)
Total Protein: 6.6 g/dL (ref 6.0–8.5)

## 2024-03-16 LAB — LIPID PANEL
Chol/HDL Ratio: 2.7 ratio (ref 0.0–5.0)
Cholesterol, Total: 124 mg/dL (ref 100–199)
HDL: 46 mg/dL (ref >39)
LDL Chol Calc (NIH): 67 mg/dL (ref 0–99)
Triglycerides: 46 mg/dL (ref 0–149)
VLDL Cholesterol Cal: 11 mg/dL (ref 5–40)

## 2024-03-18 ENCOUNTER — Ambulatory Visit: Payer: Self-pay | Admitting: Emergency Medicine

## 2024-03-24 ENCOUNTER — Other Ambulatory Visit: Payer: Self-pay

## 2024-03-24 ENCOUNTER — Other Ambulatory Visit: Payer: Self-pay | Admitting: Physician Assistant

## 2024-03-24 ENCOUNTER — Other Ambulatory Visit: Payer: Self-pay | Admitting: Internal Medicine

## 2024-03-24 MED ORDER — MIRTAZAPINE 7.5 MG PO TABS: 15.0000 mg | ORAL_TABLET | Freq: Every day | ORAL | 0 refills | Status: AC

## 2024-03-24 NOTE — Telephone Encounter (Signed)
 Medication sent to pharmacy

## 2024-03-24 NOTE — Telephone Encounter (Unsigned)
 Copied from CRM [id number]. Topic: Clinical - Medication Refill >> Mar 24, 2024 12:21 PM Shamecia H wrote: Medication: metoprolol  succinate (TOPROL -XL) 25 MG 24 hr tablet  Has the patient contacted their pharmacy? Yes (Agent: If no, request that the patient contact the pharmacy for the refill. If patient does not wish to contact the pharmacy document the reason why and proceed with request.) (Agent: If yes, when and what did the pharmacy advise?)  This is the patient's preferred pharmacy:  [id number] - [place], [place] - [address] AT [facility] [address] [place] [address] Phone: [phone] Fax: [phone]  Is this the correct pharmacy for this prescription? Yes If no, delete pharmacy and type the correct one.   Has the prescription been filled recently? No  Is the patient out of the medication? No  Has the patient been seen for an appointment in the last year OR does the patient have an upcoming appointment? Yes  Can we respond through MyChart? Yes  Agent: Please be advised that Rx refills may take up to 3 business days. We ask that you follow-up with your pharmacy.

## 2024-03-26 ENCOUNTER — Emergency Department (HOSPITAL_COMMUNITY)

## 2024-03-26 ENCOUNTER — Observation Stay (HOSPITAL_COMMUNITY)
Admission: EM | Admit: 2024-03-26 | Discharge: 2024-03-27 | Disposition: A | Discharge status: Home / Self Care | Attending: Infectious Diseases | Admitting: Infectious Diseases

## 2024-03-26 ENCOUNTER — Other Ambulatory Visit: Payer: Self-pay

## 2024-03-26 ENCOUNTER — Encounter (HOSPITAL_COMMUNITY): Payer: Self-pay

## 2024-03-26 DIAGNOSIS — I5022 Chronic systolic (congestive) heart failure: Secondary | ICD-10-CM

## 2024-03-26 DIAGNOSIS — I10 Essential (primary) hypertension: Secondary | ICD-10-CM | POA: Diagnosis present

## 2024-03-26 DIAGNOSIS — J441 Chronic obstructive pulmonary disease with (acute) exacerbation: Secondary | ICD-10-CM | POA: Diagnosis not present

## 2024-03-26 DIAGNOSIS — D696 Thrombocytopenia, unspecified: Secondary | ICD-10-CM | POA: Diagnosis not present

## 2024-03-26 DIAGNOSIS — Z8679 Personal history of other diseases of the circulatory system: Secondary | ICD-10-CM | POA: Diagnosis not present

## 2024-03-26 DIAGNOSIS — F17210 Nicotine dependence, cigarettes, uncomplicated: Secondary | ICD-10-CM | POA: Insufficient documentation

## 2024-03-26 DIAGNOSIS — J9601 Acute respiratory failure with hypoxia: Principal | ICD-10-CM | POA: Diagnosis present

## 2024-03-26 DIAGNOSIS — I4581 Long QT syndrome: Secondary | ICD-10-CM | POA: Insufficient documentation

## 2024-03-26 DIAGNOSIS — J9621 Acute and chronic respiratory failure with hypoxia: Secondary | ICD-10-CM | POA: Diagnosis present

## 2024-03-26 DIAGNOSIS — I272 Pulmonary hypertension, unspecified: Secondary | ICD-10-CM | POA: Diagnosis not present

## 2024-03-26 DIAGNOSIS — R339 Retention of urine, unspecified: Secondary | ICD-10-CM | POA: Diagnosis not present

## 2024-03-26 DIAGNOSIS — E876 Hypokalemia: Secondary | ICD-10-CM | POA: Diagnosis not present

## 2024-03-26 DIAGNOSIS — I2510 Atherosclerotic heart disease of native coronary artery without angina pectoris: Secondary | ICD-10-CM | POA: Insufficient documentation

## 2024-03-26 DIAGNOSIS — I4891 Unspecified atrial fibrillation: Secondary | ICD-10-CM | POA: Diagnosis present

## 2024-03-26 DIAGNOSIS — Z7982 Long term (current) use of aspirin: Secondary | ICD-10-CM | POA: Diagnosis not present

## 2024-03-26 DIAGNOSIS — Z7901 Long term (current) use of anticoagulants: Secondary | ICD-10-CM | POA: Insufficient documentation

## 2024-03-26 DIAGNOSIS — I5032 Chronic diastolic (congestive) heart failure: Secondary | ICD-10-CM | POA: Diagnosis not present

## 2024-03-26 DIAGNOSIS — F17200 Nicotine dependence, unspecified, uncomplicated: Secondary | ICD-10-CM | POA: Diagnosis present

## 2024-03-26 DIAGNOSIS — I509 Heart failure, unspecified: Secondary | ICD-10-CM

## 2024-03-26 DIAGNOSIS — I110 Hypertensive heart disease with heart failure: Secondary | ICD-10-CM | POA: Insufficient documentation

## 2024-03-26 DIAGNOSIS — R0600 Dyspnea, unspecified: Secondary | ICD-10-CM | POA: Diagnosis present

## 2024-03-26 DIAGNOSIS — E441 Mild protein-calorie malnutrition: Secondary | ICD-10-CM | POA: Diagnosis not present

## 2024-03-26 DIAGNOSIS — Z9889 Other specified postprocedural states: Secondary | ICD-10-CM | POA: Diagnosis not present

## 2024-03-26 DIAGNOSIS — Z79899 Other long term (current) drug therapy: Secondary | ICD-10-CM | POA: Diagnosis not present

## 2024-03-26 DIAGNOSIS — I480 Paroxysmal atrial fibrillation: Secondary | ICD-10-CM

## 2024-03-26 DIAGNOSIS — Z1152 Encounter for screening for COVID-19: Secondary | ICD-10-CM | POA: Diagnosis not present

## 2024-03-26 LAB — CBC WITH DIFFERENTIAL/PLATELET
Abs Immature Granulocytes: 0.02 K/uL (ref 0.00–0.07)
Basophils Absolute: 0 K/uL (ref 0.0–0.1)
Basophils Relative: 0 %
Eosinophils Absolute: 0 K/uL (ref 0.0–0.5)
Eosinophils Relative: 0 %
HCT: 31.1 % — ABNORMAL LOW (ref 39.0–52.0)
Hemoglobin: 9.6 g/dL — ABNORMAL LOW (ref 13.0–17.0)
Immature Granulocytes: 0 %
Lymphocytes Relative: 8 %
Lymphs Abs: 0.5 K/uL — ABNORMAL LOW (ref 0.7–4.0)
MCH: 31.2 pg (ref 26.0–34.0)
MCHC: 30.9 g/dL (ref 30.0–36.0)
MCV: 101 fL — ABNORMAL HIGH (ref 80.0–100.0)
Monocytes Absolute: 0.4 K/uL (ref 0.1–1.0)
Monocytes Relative: 6 %
Neutro Abs: 5.4 K/uL (ref 1.7–7.7)
Neutrophils Relative %: 86 %
Platelets: 75 K/uL — ABNORMAL LOW (ref 150–400)
RBC: 3.08 MIL/uL — ABNORMAL LOW (ref 4.22–5.81)
RDW: 16.1 % — ABNORMAL HIGH (ref 11.5–15.5)
WBC: 6.3 K/uL (ref 4.0–10.5)
nRBC: 0 % (ref 0.0–0.2)

## 2024-03-26 LAB — TROPONIN I (HIGH SENSITIVITY)
Troponin I (High Sensitivity): 34 ng/L — ABNORMAL HIGH (ref <18)
Troponin I (High Sensitivity): 33 ng/L — ABNORMAL HIGH (ref <18)

## 2024-03-26 LAB — COMPREHENSIVE METABOLIC PANEL WITH GFR
ALT: 26 U/L (ref 0–44)
AST: 34 U/L (ref 15–41)
Albumin: 3 g/dL — ABNORMAL LOW (ref 3.5–5.0)
Alkaline Phosphatase: 54 U/L (ref 38–126)
Anion gap: 10 (ref 5–15)
BUN: 16 mg/dL (ref 8–23)
CO2: 23 mmol/L (ref 22–32)
Calcium: 8.7 mg/dL — ABNORMAL LOW (ref 8.9–10.3)
Chloride: 107 mmol/L (ref 98–111)
Creatinine, Ser: 1.45 mg/dL — ABNORMAL HIGH (ref 0.61–1.24)
GFR, Estimated: 49 mL/min — ABNORMAL LOW (ref >60)
Glucose, Bld: 121 mg/dL — ABNORMAL HIGH (ref 70–99)
Potassium: 3.2 mmol/L — ABNORMAL LOW (ref 3.5–5.1)
Sodium: 140 mmol/L (ref 135–145)
Total Bilirubin: 0.6 mg/dL (ref 0.0–1.2)
Total Protein: 6.7 g/dL (ref 6.5–8.1)

## 2024-03-26 LAB — I-STAT CHEM 8, ED
BUN: 19 mg/dL (ref 8–23)
Calcium, Ion: 1.12 mmol/L — ABNORMAL LOW (ref 1.15–1.40)
Chloride: 107 mmol/L (ref 98–111)
Creatinine, Ser: 1.5 mg/dL — ABNORMAL HIGH (ref 0.61–1.24)
Glucose, Bld: 89 mg/dL (ref 70–99)
HCT: 29 % — ABNORMAL LOW (ref 39.0–52.0)
Hemoglobin: 9.9 g/dL — ABNORMAL LOW (ref 13.0–17.0)
Potassium: 3.3 mmol/L — ABNORMAL LOW (ref 3.5–5.1)
Sodium: 142 mmol/L (ref 135–145)
TCO2: 24 mmol/L (ref 22–32)

## 2024-03-26 LAB — URINALYSIS, ROUTINE W REFLEX MICROSCOPIC
Bilirubin Urine: NEGATIVE
Glucose, UA: NEGATIVE mg/dL
Ketones, ur: NEGATIVE mg/dL
Nitrite: NEGATIVE
Protein, ur: 100 mg/dL — AB
RBC / HPF: >50 RBC/hpf (ref 0–5)
Specific Gravity, Urine: 1.012 (ref 1.005–1.030)
pH: 7 (ref 5.0–8.0)

## 2024-03-26 LAB — RESP PANEL BY RT-PCR (RSV, FLU A&B, COVID)  RVPGX2
Influenza A by PCR: NEGATIVE
Influenza B by PCR: NEGATIVE
Resp Syncytial Virus by PCR: NEGATIVE
SARS Coronavirus 2 by RT PCR: NEGATIVE

## 2024-03-26 LAB — BRAIN NATRIURETIC PEPTIDE: B Natriuretic Peptide: 979.7 pg/mL — ABNORMAL HIGH (ref 0.0–100.0)

## 2024-03-26 LAB — I-STAT CG4 LACTIC ACID, ED
Lactic Acid, Venous: 0.8 mmol/L (ref 0.5–1.9)
Lactic Acid, Venous: 0.7 mmol/L (ref 0.5–1.9)

## 2024-03-26 MED ORDER — POLYETHYLENE GLYCOL 3350 17 G PO PACK: 17.0000 g | PACK | Freq: Every day | ORAL | Status: DC | PRN

## 2024-03-26 MED ORDER — METOPROLOL SUCCINATE ER 25 MG PO TB24
25.0000 mg | ORAL_TABLET | Freq: Every day | ORAL | Status: DC
  Administered 2024-03-27: 25 mg via ORAL
  Filled 2024-03-26: qty 1

## 2024-03-26 MED ORDER — POTASSIUM CHLORIDE CRYS ER 20 MEQ PO TBCR
40.0000 meq | EXTENDED_RELEASE_TABLET | ORAL | Status: AC
  Administered 2024-03-27 (×2): 40 meq via ORAL
  Filled 2024-03-26 (×2): qty 2

## 2024-03-26 MED ORDER — ONDANSETRON HCL 4 MG PO TABS: 4.0000 mg | ORAL_TABLET | Freq: Four times a day (QID) | ORAL | Status: DC | PRN

## 2024-03-26 MED ORDER — ARFORMOTEROL TARTRATE 15 MCG/2ML IN NEBU
15.0000 ug | INHALATION_SOLUTION | Freq: Two times a day (BID) | RESPIRATORY_TRACT | Status: DC
  Administered 2024-03-27: 15 ug via RESPIRATORY_TRACT
  Filled 2024-03-26: qty 2

## 2024-03-26 MED ORDER — HYDROCODONE-ACETAMINOPHEN 7.5-325 MG PO TABS
1.0000 | ORAL_TABLET | Freq: Two times a day (BID) | ORAL | Status: DC | PRN
  Administered 2024-03-27: 1 via ORAL
  Filled 2024-03-26: qty 1

## 2024-03-26 MED ORDER — ACETAMINOPHEN 325 MG PO TABS: 650.0000 mg | ORAL_TABLET | Freq: Four times a day (QID) | ORAL | Status: DC | PRN

## 2024-03-26 MED ORDER — CLOPIDOGREL BISULFATE 75 MG PO TABS
75.0000 mg | ORAL_TABLET | Freq: Every day | ORAL | Status: DC
  Administered 2024-03-27: 75 mg via ORAL
  Filled 2024-03-26: qty 1

## 2024-03-26 MED ORDER — IPRATROPIUM-ALBUTEROL 0.5-2.5 (3) MG/3ML IN SOLN
3.0000 mL | Freq: Once | RESPIRATORY_TRACT | Status: AC
  Administered 2024-03-26: 3 mL via RESPIRATORY_TRACT
  Filled 2024-03-26: qty 3

## 2024-03-26 MED ORDER — IPRATROPIUM-ALBUTEROL 0.5-2.5 (3) MG/3ML IN SOLN: 3.0000 mL | RESPIRATORY_TRACT | Status: DC | PRN

## 2024-03-26 MED ORDER — ESCITALOPRAM OXALATE 10 MG PO TABS
5.0000 mg | ORAL_TABLET | Freq: Every day | ORAL | Status: DC
  Administered 2024-03-27: 5 mg via ORAL
  Filled 2024-03-26: qty 1

## 2024-03-26 MED ORDER — SACUBITRIL-VALSARTAN 97-103 MG PO TABS
1.0000 | ORAL_TABLET | Freq: Two times a day (BID) | ORAL | Status: DC
  Administered 2024-03-27: 1 via ORAL
  Filled 2024-03-26 (×2): qty 1

## 2024-03-26 MED ORDER — ASPIRIN 81 MG PO TBEC
81.0000 mg | DELAYED_RELEASE_TABLET | Freq: Every day | ORAL | Status: DC
  Administered 2024-03-27: 81 mg via ORAL
  Filled 2024-03-26: qty 1

## 2024-03-26 MED ORDER — IOHEXOL 350 MG/ML SOLN
75.0000 mL | Freq: Once | INTRAVENOUS | Status: AC | PRN
Start: 2024-03-26 — End: 2024-03-26
  Administered 2024-03-26: 75 mL via INTRAVENOUS

## 2024-03-26 MED ORDER — METHYLPREDNISOLONE SODIUM SUCC 125 MG IJ SOLR
125.0000 mg | Freq: Once | INTRAMUSCULAR | Status: AC
  Administered 2024-03-26: 125 mg via INTRAVENOUS
  Filled 2024-03-26: qty 2

## 2024-03-26 MED ORDER — MIRTAZAPINE 15 MG PO TABS
15.0000 mg | ORAL_TABLET | Freq: Every day | ORAL | Status: DC
  Administered 2024-03-27: 15 mg via ORAL
  Filled 2024-03-26: qty 1

## 2024-03-26 MED ORDER — METOPROLOL SUCCINATE ER 25 MG PO TB24: 25.0000 mg | ORAL_TABLET | Freq: Every day | ORAL | 0 refills | Status: DC

## 2024-03-26 MED ORDER — ONDANSETRON HCL 4 MG/2ML IJ SOLN: 4.0000 mg | Freq: Four times a day (QID) | INTRAMUSCULAR | Status: DC | PRN

## 2024-03-26 MED ORDER — ACETAMINOPHEN 650 MG RE SUPP: 650.0000 mg | Freq: Four times a day (QID) | RECTAL | Status: DC | PRN

## 2024-03-26 MED ORDER — UMECLIDINIUM BROMIDE 62.5 MCG/ACT IN AEPB
1.0000 | INHALATION_SPRAY | Freq: Every day | RESPIRATORY_TRACT | Status: DC
  Administered 2024-03-27: 1 via RESPIRATORY_TRACT
  Filled 2024-03-26: qty 7

## 2024-03-26 MED ORDER — AMLODIPINE BESYLATE 5 MG PO TABS
10.0000 mg | ORAL_TABLET | Freq: Every day | ORAL | Status: DC
  Administered 2024-03-27: 10 mg via ORAL
  Filled 2024-03-26: qty 2

## 2024-03-26 MED ORDER — POTASSIUM CHLORIDE CRYS ER 20 MEQ PO TBCR: 40.0000 meq | EXTENDED_RELEASE_TABLET | Freq: Two times a day (BID) | ORAL | Status: DC

## 2024-03-26 NOTE — ED Provider Notes (Signed)
  Physical Exam  BP (!) 135/52   Pulse 77   Temp 98.2 F (36.8 C) (Oral)   Resp (!) 21   Ht 6' (1.829 m)   Wt 61.7 kg   SpO2 100%   BMI 18.44 kg/m   Physical Exam Vitals and nursing note reviewed.  HENT:     Head: Normocephalic and atraumatic.  Eyes:     Pupils: Pupils are equal, round, and reactive to light.  Cardiovascular:     Rate and Rhythm: Normal rate and regular rhythm.  Pulmonary:     Effort: Pulmonary effort is normal.     Breath sounds: Normal breath sounds. No wheezing.  Abdominal:     Palpations: Abdomen is soft.     Tenderness: There is no abdominal tenderness.  Skin:    General: Skin is warm and dry.  Neurological:     Mental Status: He is alert.  Psychiatric:        Mood and Affect: Mood normal.     Procedures  Procedures  ED Course / MDM   Clinical Course as of 03/26/24 1916  Fri Mar 26, 2024  1139 EKG 12-Lead EKG on my independent interpretation appears to be rate controlled atrial fibrillation.  Similar to prior [TY]  1253 DG Chest 2 View IMPRESSION: 1. Stable cardiomegaly. 2. Stable minimal bibasilar subsegmental atelectasis or scarring.  Electronically signed by: Lynwood Seip MD 03/26/2024 12:36 PM EDT RP Workstation: [id number]   [TY]  1403 Reevaluated, reports feeling improved after breathing treatment.  Lungs remain clear. [TY]  1408 Lactic Acid, Venous: 0.8 [TY]  1409 Resp panel by RT-PCR (RSV, Flu A&B, Covid) Anterior Nasal Swab Flu/COVID/RSV negative [TY]  1432 CBC with Differential(!) No leukocytosis to suggest systemic infection.  Stable anemia.  Stable thrombocytopenia [TY]  1443 Troponin I (High Sensitivity)(!): 34 Mildly elevated troponin, down from prior [TY]  1443 Comprehensive metabolic panel(!) Mildly low potassium, baseline kidney function.  No transaminitis to suggest hepatobiliary disease. [TY]  1500 B Natriuretic Peptide(!): 979.7 Chronic elevation, slightly worsened compared to prior [TY]  1506 Care signed out to  afternoon team.  Disposition pending CTA and delta troponin. [TY]  [id number] Patient reports feeling much better than earlier.  Unfortunately when he ambulated a short distance his oxygen dropped to high 70s on room air.  No O2 at baseline.  Will leave him on 2 L O2 provide another DuoNeb treatment and steroids.  Mixed picture of COPD exacerbation and heart failure.  Will need to come in for admission given new O2 requirement [MP]  1916 Discussed with admitting hospitalist who accept patient for admission.  He is stable on 2 L nasal cannula [MP]    Clinical Course User Index [MP] Pamella Ozell LABOR, DO [TY] Neysa Caron PARAS, DO   Medical Decision Making I, Ozell Pamella DO, have assumed care of this patient from the previous provider pending delta troponin reevaluation ambulatory trial and disposition  Amount and/or Complexity of Data Reviewed Labs: ordered. Decision-making details documented in ED Course. Radiology: ordered. Decision-making details documented in ED Course. ECG/medicine tests: ordered. Decision-making details documented in ED Course.  Risk Prescription drug management. Decision regarding hospitalization.          Pamella Ozell LABOR, DO 03/26/24 [phone]

## 2024-03-26 NOTE — H&P (Signed)
 History and Physical    [name] FMW:[id number] DOB: [birth year] DOA: 03/26/2024  PCP: [name], DO   Patient coming from: Home  I have personally briefly reviewed patient's old medical records in [facility]  Chief Complaint: Difficulty breathing  HPI: [name] is a 78 y.o. male with medical history significant for atrial fibrillation, CHF, emphysema, hypertension, bladder outlet obstruction-with chronic indwelling Foley catheter, AAA repair. Patient reports ongoing difficulty breathing since his surgery for repair of his aortic aneurysm 12/02/2023.  Reports feeling unwell over the past few days, reports a very mild cough.  No chest pain.  No lower extremity swelling.  No fevers no chills.  No vomiting no diarrhea. He still smokes cigarettes.  ED Course: Temperature 97.4.  Heart rate 51-77.  Respiratory rate 14-23.  Blood pressure systolic 124-153.  O2 sats 98% on room air, with ambulation O2 sats dropped to 77%, he was placed on 2 L. Troponin 34 >> 33 BNP 979.7. Potassium 3.2.  Platelets 75. CTA chest-negative for PE, dilated central pulmonary arteries suggesting pulmonary arterial hypertension.  Linear atelectasis or fibrosis in bases.  (See detailed report). DuoNebs, Solu-Medrol 125 mg x 1 given.   Hospitalist admit for acute hypoxic respiratory failure.  Review of Systems: As per HPI all other systems reviewed and negative.  Past Medical History:  Diagnosis Date   AAA (abdominal aortic aneurysm)    followed by vasc surgery   Alcohol abuse    Barretts esophagus    bx distal esophagus 2011 neg    Bladder outlet obstruction 04/02/2018   CAD in native artery    a. s/p stent to prox RCA and mid Cx.   Cataract    Cholelithiasis    Chronic osteomyelitis involving ankle and foot, right (HCC) 07/13/2018   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   2006 - 3 adenomas largest 12 mm  2008 - no polyps 2013-  2 mm cecal polyp, adenoma, routine repeat colonoscopy  02/2017 approx 05/14/2017 18 polyps max 12 mm ALL adenomas recall 2019 [name], MD, [facility]      Dieulafoy lesion of duodenum    Diverticulosis of colon    DJD (degenerative joint disease)    left hip   Full dentures    upper and lower   GERD (gastroesophageal reflux disease)    diet controlled    Gout    feet   Hiatal hernia    2 cm noted on upper endos 2011    Hx of adenomatous colonic polyps    Hyperlipidemia    Hypertension    Internal hemorrhoid    Lung cancer (HCC)    squamous cell ([id number]), 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 pt was on Plavix  x 3 years then transitioned to ASA, no problems since   Non-small cell carcinoma of right lung, stage 1 (HCC) 01/30/2016   Renal failure, acute    hx of 2/2 medication, resolved   Tobacco abuse    smoker .5 ppd x 55 yrs   UTI (lower urinary tract infection)     Past Surgical History:  Procedure Laterality Date   ABDOMINAL AORTIC ENDOVASCULAR FENESTRATED STENT GRAFT N/A 08/16/2022   Procedure: ABDOMINAL AORTIC ENDOVASCULAR FENESTRATED STENT GRAFT;  Surgeon: [name] ORN, MD;  Location: [facility] OR;  Service: Vascular;  Laterality: N/A;   ABDOMINAL AORTIC ENDOVASCULAR FENESTRATED STENT GRAFT N/A 12/02/2023   Procedure: ABDOMINAL AORTIC  ENDOVASCULAR FENESTRATED STENT GRAFT WITH ULTRASOUND GUIDED ACCESS;  Surgeon: [name] ORN, MD;  Location: [facility] INVASIVE CV LAB;  Service: Cardiovascular;  Laterality: N/A;   BIOPSY  11/12/2020   Procedure: BIOPSY;  Surgeon: Legrand Victory LITTIE DOUGLAS, MD;  Location: [facility] ENDOSCOPY;  Service: Gastroenterology;;   BIOPSY  07/25/2021   Procedure: BIOPSY;  Surgeon: Stacia Glendia BRAVO, MD;  Location: [facility] ENDOSCOPY;  Service: Gastroenterology;;   BIOPSY  12/24/2021   Procedure: BIOPSY;  Surgeon: Wilhelmenia Aloha Raddle., MD;  Location: [facility]COPY;  Service: Gastroenterology;;   BRONCHIAL BIOPSY  07/27/2021   Procedure: BRONCHIAL BIOPSIES;  Surgeon:  Shelah Lamar RAMAN, MD;  Location: [facility] ENDOSCOPY;  Service: Cardiopulmonary;;   BRONCHIAL BRUSHINGS  07/27/2021   Procedure: BRONCHIAL BRUSHINGS;  Surgeon: Shelah Lamar RAMAN, MD;  Location: [facility] ENDOSCOPY;  Service: Cardiopulmonary;;   cataract surgery Bilateral 2018   removal of cataracts   COLONOSCOPY     hx polyps - Avram 02/2012   COLONOSCOPY WITH PROPOFOL  N/A 11/12/2020   Procedure: COLONOSCOPY WITH PROPOFOL ;  Surgeon: Legrand Victory LITTIE DOUGLAS, MD;  Location: [facility] ENDOSCOPY;  Service: Gastroenterology;  Laterality: N/A;   CORONARY ANGIOPLASTY WITH STENT PLACEMENT     ENDOVASCULAR STENT INSERTION Bilateral 08/16/2022   Procedure: ENDOVASCULAR STENT GRAFT INSERTION OF BILATERAL RENAL ARTERIES;  Surgeon: [name] ORN, MD;  Location: [facility] OR;  Service: Vascular;  Laterality: Bilateral;   ESOPHAGOGASTRODUODENOSCOPY (EGD) WITH PROPOFOL  N/A 11/12/2020   Procedure: ESOPHAGOGASTRODUODENOSCOPY (EGD) WITH PROPOFOL ;  Surgeon: Legrand Victory LITTIE DOUGLAS, MD;  Location: [facility] ENDOSCOPY;  Service: Gastroenterology;  Laterality: N/A;   ESOPHAGOGASTRODUODENOSCOPY (EGD) WITH PROPOFOL  N/A 03/06/2021   Procedure: ESOPHAGOGASTRODUODENOSCOPY (EGD) WITH PROPOFOL ;  Surgeon: Leigh Elspeth SQUIBB, MD;  Location: [facility] ENDOSCOPY;  Service: Gastroenterology;  Laterality: N/A;   ESOPHAGOGASTRODUODENOSCOPY (EGD) WITH PROPOFOL  N/A 07/25/2021   Procedure: ESOPHAGOGASTRODUODENOSCOPY (EGD) WITH PROPOFOL ;  Surgeon: Stacia Glendia BRAVO, MD;  Location: [facility] ENDOSCOPY;  Service: Gastroenterology;  Laterality: N/A;   ESOPHAGOGASTRODUODENOSCOPY (EGD) WITH PROPOFOL  N/A 12/24/2021   Procedure: ESOPHAGOGASTRODUODENOSCOPY (EGD) WITH PROPOFOL ;  Surgeon: Wilhelmenia Aloha Raddle., MD;  Location: [facility] ENDOSCOPY;  Service: Gastroenterology;  Laterality: N/A;   flexible cystourethroscopy  05/24/06   HEMOSTASIS CLIP PLACEMENT  03/06/2021   Procedure: HEMOSTASIS CLIP PLACEMENT;  Surgeon: Leigh Elspeth SQUIBB, MD;  Location: [facility] ENDOSCOPY;  Service: Gastroenterology;;   HEMOSTASIS  CLIP PLACEMENT  12/24/2021   Procedure: HEMOSTASIS CLIP PLACEMENT;  Surgeon: Wilhelmenia Aloha Raddle., MD;  Location: [facility] ENDOSCOPY;  Service: Gastroenterology;;   HOT HEMOSTASIS N/A 03/06/2021   Procedure: HOT HEMOSTASIS (ARGON PLASMA COAGULATION/BICAP);  Surgeon: Leigh Elspeth SQUIBB, MD;  Location: [facility];  Service: Gastroenterology;  Laterality: N/A;   INGUINAL HERNIA REPAIR  2005   INSERTION OF ILIAC STENT Bilateral 08/16/2022   Procedure: INSERTION OF ILIAC STENT RIGHT EXTERNAL ARTERY, LEFT COMMON ARTERY, AND RIGHT COMMON ARTERY;  Surgeon: [name] ORN, MD;  Location: [facility] OR;  Service: Vascular;  Laterality: Bilateral;   IR CATHETER TUBE CHANGE  07/08/2022   LUNG LOBECTOMY Right 11/07   RUL   MULTIPLE TOOTH EXTRACTIONS     POLYPECTOMY  11/12/2020   Procedure: POLYPECTOMY;  Surgeon: Legrand Victory LITTIE DOUGLAS, MD;  Location: [facility] ENDOSCOPY;  Service: Gastroenterology;;   POLYPECTOMY  12/24/2021   Procedure: POLYPECTOMY;  Surgeon: Wilhelmenia Aloha Raddle., MD;  Location: [facility]COPY;  Service: Gastroenterology;;   SUBMUCOSAL LIFTING INJECTION  12/24/2021   Procedure: SUBMUCOSAL LIFTING INJECTION;  Surgeon: Wilhelmenia Aloha Raddle., MD;  Location: [facility];  Service: Gastroenterology;;   TRANSURETHRAL RESECTION OF  PROSTATE N/A 05/02/2021   Procedure: TRANSURETHRAL RESECTION OF THE PROSTATE (TURP);  Surgeon: Carolee Sherwood JONETTA DOUGLAS, MD;  Location: [facility] ORS;  Service: Urology;  Laterality: N/A;   ULTRASOUND GUIDANCE FOR VASCULAR ACCESS Bilateral 08/16/2022   Procedure: ULTRASOUND GUIDANCE FOR VASCULAR ACCESS;  Surgeon: [name] ORN, MD;  Location: [facility] OR;  Service: Vascular;  Laterality: Bilateral;   UPPER ESOPHAGEAL ENDOSCOPIC ULTRASOUND (EUS) N/A 12/24/2021   Procedure: UPPER ESOPHAGEAL ENDOSCOPIC ULTRASOUND (EUS);  Surgeon: Wilhelmenia Aloha Raddle., MD;  Location: [facility];  Service: Gastroenterology;  Laterality: N/A;   UPPER GASTROINTESTINAL ENDOSCOPY     VIDEO BRONCHOSCOPY  07/27/2021   Procedure:  VIDEO BRONCHOSCOPY WITHOUT FLUORO;  Surgeon: Shelah Lamar RAMAN, MD;  Location: [facility] ENDOSCOPY;  Service: Cardiopulmonary;;     reports that he has been smoking cigarettes. He has a 41.3 pack-year smoking history. He has never used smokeless tobacco. He reports that he does not currently use alcohol. He reports that he does not use drugs.  Allergies  Allergen Reactions   Candesartan Cilexetil-Hctz Other (See Comments)    Acute renal failure   Hydrochlorothiazide Other (See Comments)    Acute renal failure   Shellfish Allergy Other (See Comments)    Gout occurs   Betadine [Povidone Iodine ] Itching    Family History  Problem Relation Age of Onset   Diabetes Mother    Hypertension Sister    Heart disease Sister    Diabetes Sister    Arthritis Brother    Gout Brother    Colon cancer Neg Hx    Esophageal cancer Neg Hx    Rectal cancer Neg Hx    Stomach cancer Neg Hx    Colon polyps Neg Hx    Inflammatory bowel disease Neg Hx    Liver disease Neg Hx    Pancreatic cancer Neg Hx     Prior to Admission medications   Medication Sig Start Date End Date Taking? Authorizing Provider  albuterol  (VENTOLIN  HFA) 108 (90 Base) MCG/ACT inhaler Inhale 1-2 puffs into the lungs every 6 (six) hours as needed for wheezing or shortness of breath. 12/23/23   [name], DO  amLODipine  (NORVASC ) 10 MG tablet Take 1 tablet (10 mg total) by mouth daily. 12/09/23 12/08/24  [name], DO  aspirin  EC 81 MG tablet Take 1 tablet (81 mg total) by mouth daily. Swallow whole. 04/14/23   [name] ORN, MD  clopidogrel  (PLAVIX ) 75 MG tablet Take 1 tablet (75 mg total) by mouth daily. 12/11/23   Bethanie Cough, PA-C  escitalopram  (LEXAPRO ) 5 MG tablet TAKE 1 TABLET (5 MG TOTAL) BY MOUTH DAILY. 12/09/23 06/06/24  [name], DO  HYDROcodone -acetaminophen  (NORCO) 7.5-325 MG tablet Take 1 tablet by mouth 2 (two) times daily as needed for moderate pain (pain score 4-6). Fill 30 days after last fill. 03/12/24    [name], DO  metoprolol  succinate (TOPROL -XL) 25 MG 24 hr tablet Take 1 tablet (25 mg total) by mouth daily. 03/26/24 03/26/25  [name], DO  mirtazapine  (REMERON ) 7.5 MG tablet Take 2 tablets (15 mg total) by mouth at bedtime. 03/24/24   [name], DO  nicotine  (NICODERM CQ  - DOSED IN MG/24 HOURS) 21 mg/24hr patch Place 1 patch (21 mg total) onto the skin daily. 02/27/24   Hattar, Zola SAILOR, MD  nicotine  polacrilex (COMMIT) 4 MG lozenge TAKE 1 LOZENGE (4 MG TOTAL) BY MOUTH AS NEEDED FOR SMOKING CESSATION. 02/27/24   Hattar, Zola SAILOR, MD  Pancrelipase , Lip-Prot-Amyl, (ZENPEP ) 20000-63000 units CPEP Take 3 capsules by mouth 3 (three) times daily before meals. 11/10/23   Zheng, Michael, DO  pantoprazole  (PROTONIX ) 40 MG tablet TAKE 1 TABLET EVERY DAY FOR REFLUX 12/09/23   [name], DO  rosuvastatin  (CRESTOR ) 40 MG tablet Take 1 tablet (40 mg total) by mouth daily. 04/03/23   Jerrell Cleatus Ned, MD  sacubitril -valsartan  (ENTRESTO ) 97-103 MG Take 1 tablet by mouth 2 (two) times daily. 01/23/24   [name], DO  senna (SENOKOT) 8.6 MG TABS tablet Take 2 tablets (17.2 mg total) by mouth in the morning and at bedtime. 06/29/21   Jerrell Cleatus Ned, MD  Tiotropium Bromide Monohydrate  (SPIRIVA  RESPIMAT) 2.5 MCG/ACT AERS Inhale 2 puffs into the lungs daily. 12/23/23   [name], DO  Tiotropium Bromide-Olodaterol (STIOLTO RESPIMAT ) 2.5-2.5 MCG/ACT AERS Inhale 2 puffs into the lungs daily. 02/27/24   Hattar, Zola SAILOR, MD  Tiotropium Bromide-Olodaterol (STIOLTO RESPIMAT ) 2.5-2.5 MCG/ACT AERS Inhale 2 puffs into the lungs daily. 02/27/24   Zaida Zola SAILOR, MD    Physical Exam: Vitals:   03/26/24 1315 03/26/24 1515 03/26/24 1715 03/26/24 1855  BP: (!) 135/52 (!) 135/52  136/75  Pulse:  77  (!) 57  Resp: (!) 23 (!) 21  (!) 21  Temp:   98.2 F (36.8 C)   TempSrc:   Oral   SpO2:  100%  100%  Weight:      Height:        Constitutional: NAD, calm, comfortable Vitals:    03/26/24 1315 03/26/24 1515 03/26/24 1715 03/26/24 1855  BP: (!) 135/52 (!) 135/52  136/75  Pulse:  77  (!) 57  Resp: (!) 23 (!) 21  (!) 21  Temp:   98.2 F (36.8 C)   TempSrc:   Oral   SpO2:  100%  100%  Weight:      Height:       Eyes: PERRL, lids and conjunctivae normal ENMT: Mucous membranes are moist.  Neck: normal, supple, no masses, no thyromegaly Respiratory: clear to auscultation bilaterally, no wheezing, no crackles. Normal respiratory effort. No accessory muscle use.  Cardiovascular: Regular rate and rhythm, no murmurs / rubs / gallops. No extremity edema.  Extremities warm. Abdomen: no tenderness, no masses palpated. No hepatosplenomegaly.  Musculoskeletal: no clubbing / cyanosis. No joint deformity upper and lower extremities.  Skin: no rashes, lesions, ulcers. No induration Neurologic: No facial asymmetry, moving all extremities spontaneously, speech fluent. Psychiatric: Normal judgment and insight. Alert and oriented x 3. Normal mood.   Labs on Admission: I have personally reviewed following labs and imaging studies  CBC: Recent Labs  Lab 03/26/24 1339 03/26/24 1648  WBC 6.3  --   NEUTROABS 5.4  --   HGB 9.6* 9.9*  HCT 31.1* 29.0*  MCV 101.0*  --   PLT 75*  --    Basic Metabolic Panel: Recent Labs  Lab 03/26/24 1339 03/26/24 1648  NA 140 142  K 3.2* 3.3*  CL 107 107  CO2 23  --   GLUCOSE 121* 89  BUN 16 19  CREATININE 1.45* 1.50*  CALCIUM  8.7*  --    GFR: Estimated Creatinine Clearance: 35.4 mL/min (A) (by C-G formula based on SCr of 1.5 mg/dL (H)). Liver Function Tests: Recent Labs  Lab 03/26/24 1339  AST 34  ALT 26  ALKPHOS 54  BILITOT 0.6  PROT 6.7  ALBUMIN  3.0*   Urine analysis:    Component Value Date/Time   COLORURINE  YELLOW 03/26/2024 1123   APPEARANCEUR CLOUDY (A) 03/26/2024 1123   APPEARANCEUR Turbid (A) 03/05/2021 1130   LABSPEC 1.012 03/26/2024 1123   PHURINE 7.0 03/26/2024 1123   GLUCOSEU NEGATIVE 03/26/2024 1123    GLUCOSEU NEG mg/dL 88/83/7990 7961   HGBUR MODERATE (A) 03/26/2024 1123   BILIRUBINUR NEGATIVE 03/26/2024 1123   BILIRUBINUR Negative 03/05/2021 1130   KETONESUR NEGATIVE 03/26/2024 1123   PROTEINUR 100 (A) 03/26/2024 1123   UROBILINOGEN 0.2 02/15/2019 1016   UROBILINOGEN 1.0 03/02/2015 1130   NITRITE NEGATIVE 03/26/2024 1123   LEUKOCYTESUR MODERATE (A) 03/26/2024 1123    Radiological Exams on Admission: CT Angio Chest PE W and/or Wo Contrast Result Date: 03/26/2024 CLINICAL DATA:  Pulmonary embolus suspected with high probability. Shortness of breath. Recent abdominal aortic stent placement now shortness of breath and malaise. Unwell feeling this morning. EXAM: CT ANGIOGRAPHY CHEST WITH CONTRAST TECHNIQUE: Multidetector CT imaging of the chest was performed using the standard protocol during bolus administration of intravenous contrast. Multiplanar CT image reconstructions and MIPs were obtained to evaluate the vascular anatomy. RADIATION DOSE REDUCTION: This exam was performed according to the departmental dose-optimization program which includes automated exposure control, adjustment of the mA and/or kV according to patient size and/or use of iterative reconstruction technique. CONTRAST:  75mL OMNIPAQUE  IOHEXOL  350 MG/ML SOLN COMPARISON:  Chest radiograph 03/26/2024.  CT chest 01/09/2024 FINDINGS: Cardiovascular: Technically adequate study with good opacification of the central and segmental pulmonary arteries. No focal filling defects. No evidence of significant pulmonary embolus. The central pulmonary arteries are dilated suggesting pulmonary arterial hypertension. Ascending aortic aneurysm measuring 4 cm diameter. No change since prior study. No evidence of dissection. Great vessel origins are patent. Calcification of the aorta and coronary arteries. Cardiac enlargement. No pericardial effusions. Mediastinum/Nodes: Thyroid  gland is unremarkable. Esophagus is decompressed. No significant  lymphadenopathy in the chest. Lungs/Pleura: Emphysematous changes in the lungs. Linear atelectasis or fibrosis in the lung bases. No airspace disease or consolidation. No pleural effusion or pneumothorax. Upper Abdomen: Abdominal aortic stent graft is incompletely visualized. Stents in the origin of the celiac axis and superior mesenteric artery. Cholelithiasis. Fat planes in the upper abdomen are obscured, possibly due to ascites. Musculoskeletal: Degenerative changes in the spine. No acute bony abnormalities. Review of the MIP images confirms the above findings. IMPRESSION: 1. No evidence of significant pulmonary embolus. Dilated central pulmonary arteries suggests pulmonary arterial hypertension. 2. 4 cm ascending thoracic aortic aneurysm. No dissection. Recommend annual imaging followup by CTA or MRA. This recommendation follows 2010 ACCF/AHA/AATS/ACR/ASA/SCA/SCAI/SIR/STS/SVM Guidelines for the Diagnosis and Management of Patients with Thoracic Aortic Disease. Circulation. 2010; 121: Z733-z630. Aortic aneurysm NOS (ICD10-I71.9) 3. Prominent emphysematous changes throughout the lungs. Linear atelectasis or fibrosis in the bases. 4. Cholelithiasis.  Possible ascites in the upper abdomen. Electronically Signed   By: Elsie Gravely M.D.   On: 03/26/2024 15:23   DG Chest 2 View Result Date: 03/26/2024 EXAM: 2 VIEW(S) XRAY OF THE CHEST 03/26/2024 12:22:00 PM COMPARISON: 06/01/2023 CLINICAL HISTORY: shob. FINDINGS: LUNGS AND PLEURA: Stable minimal bibasilar subsegmental atelectasis or scarring. No pulmonary edema. No pleural effusion. No pneumothorax. HEART AND MEDIASTINUM: Stable cardiomegaly. BONES AND SOFT TISSUES: No acute osseous abnormality. IMPRESSION: 1. Stable cardiomegaly. 2. Stable minimal bibasilar subsegmental atelectasis or scarring. Electronically signed by: Lynwood Seip MD 03/26/2024 12:36 PM EDT RP Workstation: [id number]    EKG: Independently reviewed.  Atrial fibrillation, rate 62, QTc 518.   No significant change from prior.  Assessment/Plan Principal Problem:   Acute hypoxic respiratory failure (  HCC) Active Problems:   Atrial fibrillation (HCC)   Tobacco use disorder   Essential hypertension   Urinary retention   S/P AAA repair   Chronic heart failure with mildly reduced ejection fraction (HFmrEF, 41-49%) (HCC)   Thrombocytopenia   Assessment and Plan:  Acute hypoxic respiratory failure-O2 sats 98 to 100% on room air, with ambulation sats dropped to 77%.  On exam lungs are clear, without wheezing or rhonchi, no signs of edema.  Dyspnea has been ongoing since his surgery for aneurysm repair- 6/17.  Mild occasional cough.  COVID influenza RSV negative.  CTA chest negative for PE or other acute abnormality, central pulmonary arteries suggest pulmonary artery hypertension.   - Hypoxia may be from chronic lung disease, he may just need O2 with activity on discharge, reassess in a.m. - IV Solu-Medrol 25 mg x 1 given, further steroids pending clinical course. - DuoNebs as needed - Follow-up blood cultures ordered in ED  Hypokalemia potassium 3.2. - replete  COPD-stable.  With ongoing tobacco abuse half a pack of cigarettes daily.  Chronic diastolic CHF-BNP 020, last checked 9 months ago-645.  Chest imaging without signs of volume overload, no peripheral signs of edema.  Last echo 05/2023 EF of 45 to 50%.  Not on diuretics. - Resume Entresto , metoprolol  25 mg daily  Pulmonary hypertension-last echo 2024, suggests moderately elevated pulmonary artery systolic pressure, RV systolic pressure-57.6.  Prolonged QTc-518.  Potassium 2.2, check magnesium .  AAA repair-underwent initial repair 08/27/2022, and 12/02/23- underwent redo of four-vessel branched graft to treat a type 1a endoleak.   Urinary retention with chronic indwelling Foley catheter.   Atrial fibrillation-rate controlled.  Not on anticoagulation due to high bleed risk, high risk comorbidities. - Resume aspirin ,  metoprolol   Chronic thrombocytopenia-platelets 75.  Baseline over the past 3 months has ranged from 41-129.  Tobacco abuse -Patient counseled to quit smoking cigarettes.  DVT prophylaxis: SCDs with thrombocytopenia Code Status: Full code Family Communication: None at bedside Disposition Plan: ~ 1 - 2 days Consults called: None Admission status: obs tele  Author: Tully FORBES Carwin, MD 03/26/2024 11:38 PM  For on call review [email or web address].

## 2024-03-26 NOTE — ED Triage Notes (Signed)
 Patient recently had AAA stent graft and now complains of sob and malaise.  150/84  Reports he woke uu this am and felt unwell.

## 2024-03-26 NOTE — ED Notes (Signed)
 Pt ambulated with no assistance. O2 dropped to 77% room air, pt endorsed shortness of breath but states he feels that way all of the time

## 2024-03-26 NOTE — ED Notes (Signed)
 Pt otf to scan.

## 2024-03-26 NOTE — Telephone Encounter (Signed)
 Metoprolol  refill sent to pharmacy Pt overdue for visit Attempted to contact by phone, no answer, unable to leave message.  Appt made and mailed to pt

## 2024-03-26 NOTE — ED Notes (Signed)
 Phlebotomy asked to stick

## 2024-03-26 NOTE — Progress Notes (Signed)
 Foley bag replaced per provider.

## 2024-03-26 NOTE — ED Provider Notes (Signed)
 [facility] AT [facility] Provider Note   CSN: [id number] Arrival date & time: 03/26/24  1113     Patient presents with: Shortness of Breath and Fatigue   [name] is a 78 y.o. male.   This is a 78 year old male presenting emergency department for generalized malaise and shortness of breath.  He notes some dyspnea on exertion for the past couple days, increased cough but awoke this morning with generalized malaise and generalized weakness, no chest pain no abdominal pain no nausea no vomiting.  Denies headache, vision changes, numbness tingling changes in sensation.     Shortness of Breath      Prior to Admission medications   Medication Sig Start Date End Date Taking? Authorizing Provider  albuterol  (VENTOLIN  HFA) 108 (90 Base) MCG/ACT inhaler Inhale 1-2 puffs into the lungs every 6 (six) hours as needed for wheezing or shortness of breath. 12/23/23   [name], DO  amLODipine  (NORVASC ) 10 MG tablet Take 1 tablet (10 mg total) by mouth daily. 12/09/23 12/08/24  [name], DO  aspirin  EC 81 MG tablet Take 1 tablet (81 mg total) by mouth daily. Swallow whole. 04/14/23   [name] ORN, MD  clopidogrel  (PLAVIX ) 75 MG tablet Take 1 tablet (75 mg total) by mouth daily. 12/11/23   Bethanie Cough, PA-C  escitalopram  (LEXAPRO ) 5 MG tablet TAKE 1 TABLET (5 MG TOTAL) BY MOUTH DAILY. 12/09/23 06/06/24  [name], DO  HYDROcodone -acetaminophen  (NORCO) 7.5-325 MG tablet Take 1 tablet by mouth 2 (two) times daily as needed for moderate pain (pain score 4-6). Fill 30 days after last fill. 03/12/24   [name], DO  metoprolol  succinate (TOPROL -XL) 25 MG 24 hr tablet Take 1 tablet (25 mg total) by mouth daily. 03/26/24 03/26/25  [name], DO  mirtazapine  (REMERON ) 7.5 MG tablet Take 2 tablets (15 mg total) by mouth at bedtime. 03/24/24   [name], DO  nicotine  (NICODERM CQ  - DOSED IN MG/24 HOURS) 21 mg/24hr patch Place 1 patch  (21 mg total) onto the skin daily. 02/27/24   Hattar, Zola SAILOR, MD  nicotine  polacrilex (COMMIT) 4 MG lozenge TAKE 1 LOZENGE (4 MG TOTAL) BY MOUTH AS NEEDED FOR SMOKING CESSATION. 02/27/24   Hattar, Zola SAILOR, MD  Pancrelipase , Lip-Prot-Amyl, (ZENPEP ) 20000-63000 units CPEP Take 3 capsules by mouth 3 (three) times daily before meals. 11/10/23   Zheng, Michael, DO  pantoprazole  (PROTONIX ) 40 MG tablet TAKE 1 TABLET EVERY DAY FOR REFLUX 12/09/23   [name], DO  rosuvastatin  (CRESTOR ) 40 MG tablet Take 1 tablet (40 mg total) by mouth daily. 04/03/23   Jerrell Cleatus Ned, MD  sacubitril -valsartan  (ENTRESTO ) 97-103 MG Take 1 tablet by mouth 2 (two) times daily. 01/23/24   [name], DO  senna (SENOKOT) 8.6 MG TABS tablet Take 2 tablets (17.2 mg total) by mouth in the morning and at bedtime. 06/29/21   Jerrell Cleatus Ned, MD  Tiotropium Bromide Monohydrate  (SPIRIVA  RESPIMAT) 2.5 MCG/ACT AERS Inhale 2 puffs into the lungs daily. 12/23/23   [name], DO  Tiotropium Bromide-Olodaterol (STIOLTO RESPIMAT ) 2.5-2.5 MCG/ACT AERS Inhale 2 puffs into the lungs daily. 02/27/24   Hattar, Zola SAILOR, MD  Tiotropium Bromide-Olodaterol (STIOLTO RESPIMAT ) 2.5-2.5 MCG/ACT AERS Inhale 2 puffs into the lungs daily. 02/27/24   Hattar, Zola SAILOR, MD    Allergies: Candesartan cilexetil-hctz, Hydrochlorothiazide, Shellfish allergy, and Betadine [povidone iodine ]    Review of Systems  Respiratory:  Positive  for shortness of breath.     Updated Vital Signs BP 133/71   Pulse (!) 51   Temp (!) 96.4 F (35.8 C) (Rectal)   Resp (!) 23   Ht 6' (1.829 m)   Wt 61.7 kg   SpO2 98%   BMI 18.44 kg/m   Physical Exam Vitals and nursing note reviewed.  Constitutional:      General: He is not in acute distress.    Appearance: He is not toxic-appearing.  HENT:     Head: Normocephalic and atraumatic.  Cardiovascular:     Rate and Rhythm: Normal rate and regular rhythm.  Pulmonary:     Effort: Pulmonary effort is  normal.     Breath sounds: No wheezing, rhonchi or rales.  Musculoskeletal:     Cervical back: Normal range of motion.  Skin:    General: Skin is warm and dry.     Capillary Refill: Capillary refill takes less than 2 seconds.  Neurological:     General: No focal deficit present.     Mental Status: He is alert.  Psychiatric:        Behavior: Behavior normal.     (all labs ordered are listed, but only abnormal results are displayed) Labs Reviewed  URINALYSIS, ROUTINE W REFLEX MICROSCOPIC - Abnormal; Notable for the following components:      Result Value   APPearance CLOUDY (*)    Hgb urine dipstick MODERATE (*)    Protein, ur 100 (*)    Leukocytes,Ua MODERATE (*)    Bacteria, UA RARE (*)    All other components within normal limits  CBC WITH DIFFERENTIAL/PLATELET - Abnormal; Notable for the following components:   RBC 3.08 (*)    Hemoglobin 9.6 (*)    HCT 31.1 (*)    MCV 101.0 (*)    RDW 16.1 (*)    Platelets 75 (*)    Lymphs Abs 0.5 (*)    All other components within normal limits  COMPREHENSIVE METABOLIC PANEL WITH GFR - Abnormal; Notable for the following components:   Potassium 3.2 (*)    Glucose, Bld 121 (*)    Creatinine, Ser 1.45 (*)    Calcium  8.7 (*)    Albumin  3.0 (*)    GFR, Estimated 49 (*)    All other components within normal limits  BRAIN NATRIURETIC PEPTIDE - Abnormal; Notable for the following components:   B Natriuretic Peptide 979.7 (*)    All other components within normal limits  TROPONIN I (HIGH SENSITIVITY) - Abnormal; Notable for the following components:   Troponin I (High Sensitivity) 34 (*)    All other components within normal limits  RESP PANEL BY RT-PCR (RSV, FLU A&B, COVID)  [id number]  CULTURE, BLOOD (ROUTINE X 2)  CULTURE, BLOOD (ROUTINE X 2)  I-STAT CHEM 8, ED  I-STAT CG4 LACTIC ACID, ED  I-STAT CG4 LACTIC ACID, ED  TROPONIN I (HIGH SENSITIVITY)    EKG: None  Radiology: DG Chest 2 View Result Date: 03/26/2024 EXAM: 2 VIEW(S)  XRAY OF THE CHEST 03/26/2024 12:22:00 PM COMPARISON: 06/01/2023 CLINICAL HISTORY: shob. FINDINGS: LUNGS AND PLEURA: Stable minimal bibasilar subsegmental atelectasis or scarring. No pulmonary edema. No pleural effusion. No pneumothorax. HEART AND MEDIASTINUM: Stable cardiomegaly. BONES AND SOFT TISSUES: No acute osseous abnormality. IMPRESSION: 1. Stable cardiomegaly. 2. Stable minimal bibasilar subsegmental atelectasis or scarring. Electronically signed by: Lynwood Seip MD 03/26/2024 12:36 PM EDT RP Workstation: [id number]     Procedures   Medications Ordered in the ED  ipratropium-albuterol  (DUONEB) 0.5-2.5 (3) MG/3ML nebulizer solution 3 mL (3 mLs Nebulization Given 03/26/24 1332)  iohexol  (OMNIPAQUE ) 350 MG/ML injection 75 mL (75 mLs Intravenous Contrast Given 03/26/24 1455)    Clinical Course as of 03/26/24 1506  Fri Mar 26, 2024  1139 EKG 12-Lead EKG on my independent interpretation appears to be rate controlled atrial fibrillation.  Similar to prior [TY]  1253 DG Chest 2 View IMPRESSION: 1. Stable cardiomegaly. 2. Stable minimal bibasilar subsegmental atelectasis or scarring.  Electronically signed by: Lynwood Seip MD 03/26/2024 12:36 PM EDT RP Workstation: [id number]   [TY]  1403 Reevaluated, reports feeling improved after breathing treatment.  Lungs remain clear. [TY]  1408 Lactic Acid, Venous: 0.8 [TY]  1409 Resp panel by RT-PCR (RSV, Flu A&B, Covid) Anterior Nasal Swab Flu/COVID/RSV negative [TY]  1432 CBC with Differential(!) No leukocytosis to suggest systemic infection.  Stable anemia.  Stable thrombocytopenia [TY]  1443 Troponin I (High Sensitivity)(!): 34 Mildly elevated troponin, down from prior [TY]  1443 Comprehensive metabolic panel(!) Mildly low potassium, baseline kidney function.  No transaminitis to suggest hepatobiliary disease. [TY]  1500 B Natriuretic Peptide(!): 979.7 Chronic elevation, slightly worsened compared to prior [TY]  1506 Care signed out to  afternoon team.  Disposition pending CTA and delta troponin. [TY]    Clinical Course User Index [TY] Neysa Caron PARAS, DO                                 Medical Decision Making This is a 78 year old male presenting emergency department with shortness of breath and generalized malaise.  Patient is borderline bradycardic and appears to be rate controlled A-fib on EKG.  He is not having active chest pain.  He does not appear to be in overt respiratory distress on exam, lungs are largely clear.  Does have a complex past medical history to include hypertension, hyperlipidemia, alcohol abuse, CAD, heart failure, AAA status postrepair, A-fib, non-small cell carcinoma, prior MI, bladder outlet obstruction with suprapubic Foley.  Will trial DuoNeb given his history of emphysema increased cough.  He does not overtly look fluid overloaded, will get BNP, troponin, chest x-ray.  Will also get electrolytes, CBC.  Will also get CTA to evaluate for possible PE.  See ED course for further MDM and disposition.  Amount and/or Complexity of Data Reviewed External Data Reviewed:     Details:   Per cardiology note on my independent chart review:   Paroxysmal atrial fibrillation Bradycardia Trifascicular block PVCs Remote history of VT (08/2021) - Today he is in rate controlled atrial fibrillation, HR 82 bpm today - Remains cardiac unaware of arrhythmia without alarm symptoms.  No syncope - He remains fairly asymptomatic, no indication for PPM at this time - Previously not a candidate for [facility] due to high risk of bleed - Per EP he is not currently on OAC due to high risk comorbidities.  Not a candidate for rhythm control off of OAC - Management per EP   AAA s/p repair Underwent a fenestrated juxtarenal abdominal aortic aneurysm repair with stenting of his left and right renal arteries on 08/16/2022. 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 Decision-making details documented in ED Course. Radiology: ordered and independent interpretation performed. Decision-making details documented in ED Course. ECG/medicine tests:  ordered and independent interpretation performed. Decision-making details documented in ED Course.  Risk Prescription drug management. Diagnosis or treatment significantly limited by social determinants of health. Risk Details: Poor health literacy       Final diagnoses:  None    ED Discharge Orders     None          Neysa Caron PARAS, DO 03/26/24 1506

## 2024-03-27 DIAGNOSIS — E441 Mild protein-calorie malnutrition: Secondary | ICD-10-CM

## 2024-03-27 DIAGNOSIS — Z951 Presence of aortocoronary bypass graft: Secondary | ICD-10-CM

## 2024-03-27 DIAGNOSIS — J441 Chronic obstructive pulmonary disease with (acute) exacerbation: Secondary | ICD-10-CM

## 2024-03-27 DIAGNOSIS — Z960 Presence of urogenital implants: Secondary | ICD-10-CM

## 2024-03-27 DIAGNOSIS — J9601 Acute respiratory failure with hypoxia: Secondary | ICD-10-CM | POA: Diagnosis not present

## 2024-03-27 DIAGNOSIS — F32A Depression, unspecified: Secondary | ICD-10-CM

## 2024-03-27 DIAGNOSIS — I5021 Acute systolic (congestive) heart failure: Secondary | ICD-10-CM | POA: Diagnosis not present

## 2024-03-27 DIAGNOSIS — I4891 Unspecified atrial fibrillation: Secondary | ICD-10-CM

## 2024-03-27 DIAGNOSIS — I110 Hypertensive heart disease with heart failure: Secondary | ICD-10-CM | POA: Diagnosis not present

## 2024-03-27 DIAGNOSIS — Z7951 Long term (current) use of inhaled steroids: Secondary | ICD-10-CM

## 2024-03-27 DIAGNOSIS — Z79899 Other long term (current) drug therapy: Secondary | ICD-10-CM

## 2024-03-27 LAB — CBC
HCT: 32.1 % — ABNORMAL LOW (ref 39.0–52.0)
Hemoglobin: 10 g/dL — ABNORMAL LOW (ref 13.0–17.0)
MCH: 31.2 pg (ref 26.0–34.0)
MCHC: 31.2 g/dL (ref 30.0–36.0)
MCV: 100 fL (ref 80.0–100.0)
Platelets: 78 K/uL — ABNORMAL LOW (ref 150–400)
RBC: 3.21 MIL/uL — ABNORMAL LOW (ref 4.22–5.81)
RDW: 15.9 % — ABNORMAL HIGH (ref 11.5–15.5)
WBC: 4.9 K/uL (ref 4.0–10.5)
nRBC: 0 % (ref 0.0–0.2)

## 2024-03-27 LAB — BASIC METABOLIC PANEL WITH GFR
Anion gap: 11 (ref 5–15)
BUN: 15 mg/dL (ref 8–23)
CO2: 23 mmol/L (ref 22–32)
Calcium: 8.6 mg/dL — ABNORMAL LOW (ref 8.9–10.3)
Chloride: 102 mmol/L (ref 98–111)
Creatinine, Ser: 1.4 mg/dL — ABNORMAL HIGH (ref 0.61–1.24)
GFR, Estimated: 51 mL/min — ABNORMAL LOW (ref >60)
Glucose, Bld: 133 mg/dL — ABNORMAL HIGH (ref 70–99)
Potassium: 4.3 mmol/L (ref 3.5–5.1)
Sodium: 136 mmol/L (ref 135–145)

## 2024-03-27 LAB — MAGNESIUM: Magnesium: 2 mg/dL (ref 1.7–2.4)

## 2024-03-27 MED ORDER — PANCRELIPASE (LIP-PROT-AMYL) 12000-38000 UNITS PO CPEP
48000.0000 [IU] | ORAL_CAPSULE | Freq: Three times a day (TID) | ORAL | Status: DC
  Administered 2024-03-27: 48000 [IU] via ORAL
  Filled 2024-03-27 (×2): qty 4

## 2024-03-27 MED ORDER — ROSUVASTATIN CALCIUM 20 MG PO TABS
40.0000 mg | ORAL_TABLET | Freq: Every day | ORAL | Status: DC
  Administered 2024-03-27: 40 mg via ORAL
  Filled 2024-03-27: qty 2

## 2024-03-27 MED ORDER — SENNA 8.6 MG PO TABS: 1.0000 | ORAL_TABLET | Freq: Two times a day (BID) | ORAL | Status: DC

## 2024-03-27 NOTE — Progress Notes (Signed)
 HD#0 SUBJECTIVE:  Patient Summary: [name] is a 78 y.o. with a pertinent PMH of atrial fibrillation, CHF, emphysema, hypertension, bladder outlet obstruction-with chronic indwelling Foley catheter, AAA repair on 12/02/2023, who presented with shortness of breath and malaise and admitted for acute hypoxic respiratory failure.   Overnight Events: None  Interim History: [name] is feeling well this morning. He would like to go home. He denies chest pain, shortness of breath, abdominal pain, extremity swelling.  OBJECTIVE:  Vital Signs: Vitals:   03/27/24 0200 03/27/24 0346 03/27/24 0400 03/27/24 0615  BP: (!) 157/85  (!) 148/92 (!) 113/59  Pulse: 75  78 72  Resp: (!) 23  (!) 21 16  Temp:  98.7 F (37.1 C)    TempSrc:  Oral    SpO2: 97%  98% 96%  Weight:      Height:       Supplemental O2: Room Air SpO2: 96 %  Filed Weights   03/26/24 1115  Weight: 61.7 kg     Intake/Output Summary (Last 24 hours) at 03/27/2024 0656 Last data filed at 03/26/2024 2007 Gross per 24 hour  Intake --  Output 400 ml  Net -400 ml   Net IO Since Admission: -400 mL [03/27/24 0656]  Physical Exam: Physical Exam Constitutional:      General: He is not in acute distress.    Appearance: He is not ill-appearing.  Eyes:     Extraocular Movements: Extraocular movements intact.  Cardiovascular:     Rate and Rhythm: Bradycardia present. Rhythm irregular.     Heart sounds: Murmur heard.     No friction rub. No gallop.  Pulmonary:     Effort: Pulmonary effort is normal. No tachypnea, accessory muscle usage or respiratory distress.     Breath sounds: No decreased breath sounds, wheezing, rhonchi or rales.  Abdominal:     Palpations: Abdomen is soft.     Tenderness: There is no abdominal tenderness.  Musculoskeletal:     Right lower leg: No edema.     Left lower leg: No edema.  Neurological:     Mental Status: He is alert.      Patient Lines/Drains/Airways Status     Active  Line/Drains/Airways     Name Placement date Placement time Site Days   Peripheral IV 03/26/24 20 G Anterior;Distal;Left;Upper Arm 03/26/24  1347  Arm  1   Suprapubic Catheter Latex 18 Fr. 07/08/22  1058  Latex  628            Pertinent labs and imaging:      Latest Ref Rng & Units 03/27/2024    3:44 AM 03/26/2024    4:48 PM 03/26/2024    1:39 PM  CBC  WBC 4.0 - 10.5 K/uL 4.9   6.3   Hemoglobin 13.0 - 17.0 g/dL 89.9  9.9  9.6   Hematocrit 39.0 - 52.0 % 32.1  29.0  31.1   Platelets 150 - 400 K/uL 78   75        Latest Ref Rng & Units 03/27/2024    3:44 AM 03/26/2024    4:48 PM 03/26/2024    1:39 PM  CMP  Glucose 70 - 99 mg/dL 866  89  878   BUN 8 - 23 mg/dL 15  19  16    Creatinine 0.61 - 1.24 mg/dL 8.59  8.49  8.54   Sodium 135 - 145 mmol/L 136  142  140   Potassium 3.5 - 5.1 mmol/L  4.3  3.3  3.2   Chloride 98 - 111 mmol/L 102  107  107   CO2 22 - 32 mmol/L 23   23   Calcium  8.9 - 10.3 mg/dL 8.6   8.7   Total Protein 6.5 - 8.1 g/dL   6.7   Total Bilirubin 0.0 - 1.2 mg/dL   0.6   Alkaline Phos 38 - 126 U/L   54   AST 15 - 41 U/L   34   ALT 0 - 44 U/L   26     Latest Reference Range & Units 03/26/24 13:39 03/26/24 16:42  B Natriuretic Peptide 0.0 - 100.0 pg/mL 979.7 (H)   Troponin I (High Sensitivity) <18 ng/L 34 (H) 33 (H)  (H): Data is abnormally high   Latest Reference Range & Units 03/26/24 11:23  Urinalysis, Routine w reflex microscopic  Rpt !  Appearance CLEAR  CLOUDY !  Bilirubin Urine NEGATIVE  NEGATIVE  Color, Urine YELLOW  YELLOW  Glucose, UA NEGATIVE mg/dL NEGATIVE  Hgb urine dipstick NEGATIVE  MODERATE !  Ketones, ur NEGATIVE mg/dL NEGATIVE  Leukocytes,Ua NEGATIVE  MODERATE !  Nitrite NEGATIVE  NEGATIVE  pH 5.0 - 8.0  7.0  Protein NEGATIVE mg/dL 899 !  Specific Gravity, Urine 1.005 - 1.030  1.012  Bacteria, UA NONE SEEN  RARE !  Mucus  PRESENT  RBC / HPF 0 - 5 RBC/hpf >50  Squamous Epithelial / HPF 0 - 5 /HPF 0-5  WBC, UA 0 - 5 WBC/hpf 21-50   !: Data is abnormal Rpt: View report in Results Review for more information CT Angio Chest PE W and/or Wo Contrast Result Date: 03/26/2024 CLINICAL DATA:  Pulmonary embolus suspected with high probability. Shortness of breath. Recent abdominal aortic stent placement now shortness of breath and malaise. Unwell feeling this morning. EXAM: CT ANGIOGRAPHY CHEST WITH CONTRAST TECHNIQUE: Multidetector CT imaging of the chest was performed using the standard protocol during bolus administration of intravenous contrast. Multiplanar CT image reconstructions and MIPs were obtained to evaluate the vascular anatomy. RADIATION DOSE REDUCTION: This exam was performed according to the departmental dose-optimization program which includes automated exposure control, adjustment of the mA and/or kV according to patient size and/or use of iterative reconstruction technique. CONTRAST:  75mL OMNIPAQUE  IOHEXOL  350 MG/ML SOLN COMPARISON:  Chest radiograph 03/26/2024.  CT chest 01/09/2024 FINDINGS: Cardiovascular: Technically adequate study with good opacification of the central and segmental pulmonary arteries. No focal filling defects. No evidence of significant pulmonary embolus. The central pulmonary arteries are dilated suggesting pulmonary arterial hypertension. Ascending aortic aneurysm measuring 4 cm diameter. No change since prior study. No evidence of dissection. Great vessel origins are patent. Calcification of the aorta and coronary arteries. Cardiac enlargement. No pericardial effusions. Mediastinum/Nodes: Thyroid  gland is unremarkable. Esophagus is decompressed. No significant lymphadenopathy in the chest. Lungs/Pleura: Emphysematous changes in the lungs. Linear atelectasis or fibrosis in the lung bases. No airspace disease or consolidation. No pleural effusion or pneumothorax. Upper Abdomen: Abdominal aortic stent graft is incompletely visualized. Stents in the origin of the celiac axis and superior mesenteric artery.  Cholelithiasis. Fat planes in the upper abdomen are obscured, possibly due to ascites. Musculoskeletal: Degenerative changes in the spine. No acute bony abnormalities. Review of the MIP images confirms the above findings. IMPRESSION: 1. No evidence of significant pulmonary embolus. Dilated central pulmonary arteries suggests pulmonary arterial hypertension. 2. 4 cm ascending thoracic aortic aneurysm. No dissection. Recommend annual imaging followup by CTA or MRA. This recommendation follows 2010  ACCF/AHA/AATS/ACR/ASA/SCA/SCAI/SIR/STS/SVM Guidelines for the Diagnosis and Management of Patients with Thoracic Aortic Disease. Circulation. 2010; 121: Z733-z630. Aortic aneurysm NOS (ICD10-I71.9) 3. Prominent emphysematous changes throughout the lungs. Linear atelectasis or fibrosis in the bases. 4. Cholelithiasis.  Possible ascites in the upper abdomen. Electronically Signed   By: Elsie Gravely M.D.   On: 03/26/2024 15:23   DG Chest 2 View Result Date: 03/26/2024 EXAM: 2 VIEW(S) XRAY OF THE CHEST 03/26/2024 12:22:00 PM COMPARISON: 06/01/2023 CLINICAL HISTORY: shob. FINDINGS: LUNGS AND PLEURA: Stable minimal bibasilar subsegmental atelectasis or scarring. No pulmonary edema. No pleural effusion. No pneumothorax. HEART AND MEDIASTINUM: Stable cardiomegaly. BONES AND SOFT TISSUES: No acute osseous abnormality. IMPRESSION: 1. Stable cardiomegaly. 2. Stable minimal bibasilar subsegmental atelectasis or scarring. Electronically signed by: Lynwood Seip MD 03/26/2024 12:36 PM EDT RP Workstation: [id number]    ASSESSMENT/PLAN:  Assessment: Principal Problem:   Acute hypoxic respiratory failure (HCC) Active Problems:   Tobacco use disorder   Essential hypertension   Urinary retention   Atrial fibrillation (HCC)   S/P AAA repair   Chronic heart failure with mildly reduced ejection fraction (HFmrEF, 41-49%) (HCC)   Thrombocytopenia  [name] is a 78 y.o. with a pertinent PMH of atrial fibrillation, CHF,  emphysema, hypertension, bladder outlet obstruction-with chronic indwelling Foley catheter, AAA repair on 12/02/2023, who presented with shortness of breath and malaise and admitted for acute hypoxic respiratory failure.   Plan: #Acute hypoxic respiratory failure Patient presented with shortness of breath and malaise that had been present since his recent surgery on 6/17 and worsening since then. His sats dropped to 77% with ambulation in the ED, but have remained in the 96-100% range on room air with rest. Covid, flu, RSV negative. CTA showed no PE, but did show pulmonary hypertension. Differential includes worsening COPD, pulmonary hypertension, deconditioning after surgery, heart failure exacerbation. He received IV Solu-Medrol 25mg  in the ED. Also received Duonebs x2 in the ED. This morning, he has no respiratory distress. He has been saturating well on room air. Lungs CTAB.  -Ambulate with pulse oximetry today after inhaler administration, patient may require home oxygen -Duonebs q4 PRN   #COPD Home medications Stiolto Respimat  and albuterol  Rescue inhaler. Does not have increased sputum to suggest COPD exacerbation. Last saw pulmonology in September of 2025 at which time the plan was to obtain PFTs and refer to pulmonary rehab. He has not previously been on home oxygen.  -Brovana nebulizer 2 times daily -Incruse Ellipta 1 puff daily -PFT outpatient, it appears to be scheduled.  #HFmrEF (EF 45-50%) Last echo 05/2023 which showed EF of 45 to 50% with left ventricular regional wall motion abnormalities, decreased right ventricular systolic function and elevated pulmonary artery pressure.  Euvolemic on exam today. Unlikely that he is in exacerbation. Home medications Entresto  97-103mg  and Metoprolol  succinate 25mg  daily.  -Continue Entresto  97-103mg  -Continue Metoprolol  succinate 25mg  daily. Consider holding if he continues to be bradycardic  #Hypokalemia 3.2 on admission. Improved to 4.3 his  morning after repletion yesterday. -Daily BMP -Keep K>4  #Hypertension BP on admission 138/67. Home medication Amlodipine  10mg  daily, Entresto  97-103mg , and Metoprolol  25mg  daily. 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 CTA showed a 4cm ascending thoracic aortic aneurysm that is stable since  prior imaging -1 year follow up imaging with CTA and MRA -BP control as above   #Urinary retention With chronic indwelling foley catheter. UA showed hematuria with WBCs as well. Patient currently asymptomatic -Monitor for dysuria/suprapubic pain  #A-fib Rate controlled with home Metoprolol . Not on anticoagulation due to high bleed risk. Rhythm irregular today with bradycardia.  -Metoprolol  as above, consider holding today for bradycardia  #HLD -Continue home Rosuvastatin  40mg  daily  #Protein calorie Malnutrition -Continue home Remeron  15mg  -Continue home Pacrealipase.  #Depression -Continue home Escitalopram  5mg   #Chronic thrombocytopenia Platelets 78 this morning, have been chronically low and are now at baseline.   Best Practice: Diet: Cardiac diet VTE: SCDs Start: 03/26/24 2337 Holding VTE prophylaxis for high bleed risk Code: Full  Disposition planning: DISPO: Anticipated discharge today to Home pending ambulatory pulse oximetry.  Signature:  Melvenia Napoleon [facility]  6:56 AM, 03/27/2024  On Call pager [phone]

## 2024-03-27 NOTE — ED Notes (Signed)
 SATURATION QUALIFICATIONS: (This note is used to comply with regulatory documentation for home oxygen)  Patient Saturations on Room Air at Rest =100%  Patient Saturations on Room Air while Ambulating = 89%  Patient Saturations on 2 Liters of oxygen while Ambulating = 87%

## 2024-03-27 NOTE — Progress Notes (Signed)
 Internal Medicine Teaching Service to Assume Care at 7AM on 03/27/24.  Marise Knapper, MD Internal Medicine Resident, PGY-3

## 2024-03-27 NOTE — ED Notes (Signed)
 Pt's foley bag was changed to a leg bag before d/c.

## 2024-03-27 NOTE — ED Notes (Signed)
 Requested ordered inhaler from pharmacy x2, still waiting for it to arrive to unit.

## 2024-03-27 NOTE — Care Management (Addendum)
 Patient with planning to discharge today, just needs ambulatory sats documented.  Messaged with Nursing with format. Messaging with team and spoke to the patient at the bedside Explained oxygen set up, tanks and concentrator at home. He states his brother is on oxygen also.  Called [facility] at [employer] for oxygen set up to be delivered patient and staff aware it will be 1-2 hours to get tanks

## 2024-03-27 NOTE — ED Notes (Signed)
 Tech ambulated pt 02 sat was at 97 then while walking back towards his room it went down to 89.

## 2024-03-27 NOTE — Progress Notes (Signed)
  On RA pt was 85% during ambulation, was 98% during ambulation on 3L O2 via n/c & AFTER ambulation when standing still was 100% on 3L.

## 2024-03-27 NOTE — Discharge Instructions (Signed)
  You were hospitalized for shortness of breath. Thank you for allowing us  to be part of your care.   Keep your appointments with your primary care doctor, cardiologist, and lung doctor. You can find those listed elswhere  Keep taking your prescribed medications and inhalers.  Please use your oxygen at 3L when you are walking around.   Please call our clinic if you have any questions or concerns, we may be able to help and keep you from a long and expensive emergency room wait. Our clinic and after hours phone number is [phone], the best time to call is Monday through Friday 9 am to 4 pm but there is always someone available 24/7 if you have an emergency. If you need medication refills please notify your pharmacy one week in advance and they will send us  a request.

## 2024-03-27 NOTE — Discharge Summary (Signed)
 Name: [name] MRN: [id number] DOB: [birth year] 78 y.o. PCP: [name] Dayton [name], DO  Date of Admission: 03/26/2024 11:13 AM Date of Discharge: 03/27/2024 Attending Physician: Dr. Reyes Fenton  Discharge Diagnosis: 1. Principal Problem:   Acute hypoxic respiratory failure (HCC) Active Problems:   Tobacco use disorder   Essential hypertension   Urinary retention   Mild protein-calorie malnutrition   Atrial fibrillation (HCC)   S/P AAA repair   Chronic congestive heart failure (HCC)   Thrombocytopenia   COPD exacerbation (HCC)    Discharge Medications: Allergies as of 03/27/2024       Reactions   Candesartan Cilexetil-hctz Other (See Comments)   Acute renal failure   Hydrochlorothiazide Other (See Comments)   Acute renal failure   Shellfish Allergy Other (See Comments)   Gout occurs   Betadine [povidone Iodine ] Itching        Medication List     STOP taking these medications    nicotine  21 mg/24hr patch Commonly known as: NICODERM CQ  - dosed in mg/24 hours   nicotine  polacrilex 4 MG lozenge Commonly known as: COMMIT       TAKE these medications    albuterol  108 (90 Base) MCG/ACT inhaler Commonly known as: VENTOLIN  HFA Inhale 1-2 puffs into the lungs every 6 (six) hours as needed for wheezing or shortness of breath.   amLODipine  10 MG tablet Commonly known as: NORVASC  Take 1 tablet (10 mg total) by mouth daily.   aspirin  EC 81 MG tablet Take 1 tablet (81 mg total) by mouth daily. Swallow whole.   clopidogrel  75 MG tablet Commonly known as: PLAVIX  Take 1 tablet (75 mg total) by mouth daily.   escitalopram  5 MG tablet Commonly known as: LEXAPRO  TAKE 1 TABLET (5 MG TOTAL) BY MOUTH DAILY.   HYDROcodone -acetaminophen  7.5-325 MG tablet Commonly known as: NORCO Take 1 tablet by mouth 2 (two) times daily as needed for moderate pain (pain score 4-6). Fill 30 days after last fill.   metoprolol  succinate 25 MG 24 hr tablet Commonly known as:  TOPROL -XL Take 1 tablet (25 mg total) by mouth daily.   mirtazapine  7.5 MG tablet Commonly known as: REMERON  Take 2 tablets (15 mg total) by mouth at bedtime.   pantoprazole  40 MG tablet Commonly known as: PROTONIX  TAKE 1 TABLET EVERY DAY FOR REFLUX   rosuvastatin  40 MG tablet Commonly known as: CRESTOR  Take 1 tablet (40 mg total) by mouth daily.   sacubitril -valsartan  97-103 MG Commonly known as: ENTRESTO  Take 1 tablet by mouth 2 (two) times daily.   senna 8.6 MG Tabs tablet Commonly known as: SENOKOT Take 1 tablet (8.6 mg total) by mouth in the morning and at bedtime.   Stiolto Respimat  2.5-2.5 MCG/ACT Aers Generic drug: Tiotropium Bromide-Olodaterol Inhale 2 puffs into the lungs daily.   Zenpep  20000-63000 units Cpep Generic drug: Pancrelipase  (Lip-Prot-Amyl) Take 3 capsules by mouth 3 (three) times daily before meals.               Durable Medical Equipment  (From admission, onward)           Start     Ordered   03/27/24 1201  For home use only DME oxygen  Once       Comments: 3L necessary with ambulation only, not needed at rest.  Question Answer Comment  Length of Need Lifetime   Mode or (Route) Nasal cannula   Liters per Minute 3   Oxygen delivery system Gas  03/27/24 1202            Disposition and follow-up:   [name] was discharged from [facility] in Good condition.  At the hospital follow up visit please address:  1.  COPD  -Dyspnea on exertion possibly attributable to worsening COPD  -Assess for further dyspneic symptoms, patient discharged with 3L of O2 for ambulation  -Ensure adherence to home regimen of Stiolto Respimat  daily and Albuterol  as needed.  -PFTs scheduled for 12/8  -Ensure follow up with pulmonology, who are setting up pulmonary rehab  2. A-fib  -Discharged on Metoprolol  25mg  daily  -Bradycardic during admission  -Consider holding Metoprolol  if patient continues to be  bradycardic  3. Thoracic Aortic aneurysm  -Recently had Abdominal Aortic Aneurysm repair 12/02/23 and 08/16/22  -Has a non-related 4cm ascending thoracic aortic aneurysm that is stable on CTA  -Follow-up with CTA or MRA in 1 year.  4. Hypokalemia  -3.2 on admission, received appropriate repletion  -Consider BMP  2.  Labs / imaging needed at time of follow-up: BMP, CTA chest in one year  3.  Pending labs/ test needing follow-up: Blood cultures (low likelihood of infection)  Follow-up Appointments:  Follow-up Information     [name] Dayton [name], DO Follow up on 04/26/2024.   Specialty: Internal Medicine Why: At 11:15 AM Contact information: [address] [place] [address] [phone]         [name] Soyla Lunger, MD Follow up on 04/02/2024.   Specialty: Cardiology Why: At 11:30AM Contact information: [address] [place] [address] [phone]         [name] Zola SAILOR, MD Follow up on 05/24/2024.   Specialty: Pulmonary Disease Why: At 10:30AM Contact information: [address] [place] [address] [phone]         [facility], [employer] Follow up.   Why: Company for oxygen Contact information: [address] [address] [phone]                  Hospital Course by problem list: [name] is a 78 y.o. 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 His sats dropped to 77% with ambulation in the ED, but remained in the  96-100% range on room air with rest. Covid, flu, RSV negative. CTA showed no PE, but did show pulmonary hypertension. Symptoms are exertional, and may represent a worsening of his COPD baseline, as he was not have a COPD or heart failure exacerbation. He received IV Solu-Medrol 25mg  in the ED. Also received Duonebs x2 in the ED. On the day of discharge, he was saturating well on room air but did desaturate to 85% with ambulation. His O2 improved on ambulation to 98% while on 3L [place]. He was discharged with 3L of O2 to be used with ambulation. He does not require any oxygen at rest.    #COPD Home medications Stiolto Respimat  and albuterol  rescue inhaler. Did not have increased sputum to suggest COPD exacerbation. Last saw pulmonology in September of 2025 at which time the plan  was to obtain PFTs and refer to pulmonary rehab. He has not previously been on home oxygen, but now will require O2 with ambulation as above. He can continue his home COPD regimen.   #HFmrEF (EF 45-50%) Last echo 05/2023 which showed EF of 45 to 50% with left ventricular regional wall motion abnormalities, decreased right ventricular systolic function and elevated pulmonary artery pressure.  Euvolemic on exam today. Unlikely that he is in exacerbation. Home medications Entresto  97-103mg  and Metoprolol  succinate 25mg  daily. He was advised to not take Metoprolol  if his heart rate was below 60.   #Hypokalemia 3.2 on admission. Repleted with 80 mEq of potassium and improved to 4.3 on the day of discharge   #Hypertension BP on admission 138/67. He was managed on her home medication Amlodipine  10mg  daily, Entresto  97-103mg , and Metoprolol  25mg  daily. BP on the morning of discharge 113/59.    #AAA repair Underwent initial repair 08/27/2022, and 12/02/23- underwent redo of four-vessel branched graft to treat a type 1a endoleak. CTA showed a 4cm ascending thoracic aortic aneurysm that is stable since prior imaging. It was advised that he  schedule a one year follow up imaging with CTA and MRA   #Urinary retention With chronic indwelling foley catheter. UA showed hematuria with WBCs as well. Patient was asymptomatic   #A-fib Rate controlled with home Metoprolol . Not on anticoagulation due to high bleed risk. Rhythm irregular today with bradycardia. Advised to hold Metoprolol  if heart rate below 60.   #HLD Managed with home Rosuvastatin  40mg  daily   #Protein calorie Malnutrition Managed with home Remeron  15mg  and Pancrealipase.  #Depression Managed with home Escitalopram  5mg    #Chronic thrombocytopenia Platelets 78 on morning of discharge, at baseline.    Discharge Subjective ZARIF RATHJE is feeling well this morning. He would like to go home. He denies chest pain, shortness of breath, abdominal pain, extremity swelling.   Discharge Exam:   BP 134/68 (BP Location: Right Arm)   Pulse 64   Temp 98.6 F (37 C) (Oral)   Resp 19   Ht 6' (1.829 m)   Wt 61.7 kg   SpO2 98%   BMI 18.44 kg/m  Discharge exam:  Constitutional:      General: He is not in acute distress.    Appearance: He is not ill-appearing.  Eyes:     Extraocular Movements: Extraocular movements intact.  Cardiovascular:     Rate and Rhythm: Bradycardia present. Rhythm irregular.     Heart sounds: Murmur heard.     No friction rub. No gallop.  Pulmonary:     Effort: Pulmonary effort is normal. No tachypnea, accessory muscle usage or respiratory distress.     Breath sounds: No decreased breath sounds, wheezing, rhonchi or rales.  Abdominal:     Palpations: Abdomen is soft.     Tenderness: There is no abdominal tenderness.  Musculoskeletal:     Right lower leg: No edema.     Left lower leg: No edema.  Neurological:     Mental Status: He is alert.   Pertinent Labs, Studies, and Procedures:     Latest Ref Rng & Units 03/27/2024    3:44 AM 03/26/2024    4:48 PM 03/26/2024    1:39 PM  CBC  WBC 4.0 - 10.5 K/uL 4.9   6.3   Hemoglobin 13.0  - 17.0 g/dL 89.9  9.9  9.6   Hematocrit 39.0 - 52.0 % 32.1  29.0  31.1   Platelets 150 - 400 K/uL 78  75        Latest Ref Rng & Units 03/27/2024    3:44 AM 03/26/2024    4:48 PM 03/26/2024    1:39 PM  CMP  Glucose 70 - 99 mg/dL 866  89  878   BUN 8 - 23 mg/dL 15  19  16    Creatinine 0.61 - 1.24 mg/dL 8.59  8.49  8.54   Sodium 135 - 145 mmol/L 136  142  140   Potassium 3.5 - 5.1 mmol/L 4.3  3.3  3.2   Chloride 98 - 111 mmol/L 102  107  107   CO2 22 - 32 mmol/L 23   23   Calcium  8.9 - 10.3 mg/dL 8.6   8.7   Total Protein 6.5 - 8.1 g/dL   6.7   Total Bilirubin 0.0 - 1.2 mg/dL   0.6   Alkaline Phos 38 - 126 U/L   54   AST 15 - 41 U/L   34   ALT 0 - 44 U/L   26     CT Angio Chest PE W and/or Wo Contrast Result Date: 03/26/2024 CLINICAL DATA:  Pulmonary embolus suspected with high probability. Shortness of breath. Recent abdominal aortic stent placement now shortness of breath and malaise. Unwell feeling this morning. EXAM: CT ANGIOGRAPHY CHEST WITH CONTRAST TECHNIQUE: Multidetector CT imaging of the chest was performed using the standard protocol during bolus administration of intravenous contrast. Multiplanar CT image reconstructions and MIPs were obtained to evaluate the vascular anatomy. RADIATION DOSE REDUCTION: This exam was performed according to the departmental dose-optimization program which includes automated exposure control, adjustment of the mA and/or kV according to patient size and/or use of iterative reconstruction technique. CONTRAST:  75mL OMNIPAQUE  IOHEXOL  350 MG/ML SOLN COMPARISON:  Chest radiograph 03/26/2024.  CT chest 01/09/2024 FINDINGS: Cardiovascular: Technically adequate study with good opacification of the central and segmental pulmonary arteries. No focal filling defects. No evidence of significant pulmonary embolus. The central pulmonary arteries are dilated suggesting pulmonary arterial hypertension. Ascending aortic aneurysm measuring 4 cm diameter. No change  since prior study. No evidence of dissection. Great vessel origins are patent. Calcification of the aorta and coronary arteries. Cardiac enlargement. No pericardial effusions. Mediastinum/Nodes: Thyroid  gland is unremarkable. Esophagus is decompressed. No significant lymphadenopathy in the chest. Lungs/Pleura: Emphysematous changes in the lungs. Linear atelectasis or fibrosis in the lung bases. No airspace disease or consolidation. No pleural effusion or pneumothorax. Upper Abdomen: Abdominal aortic stent graft is incompletely visualized. Stents in the origin of the celiac axis and superior mesenteric artery. Cholelithiasis. Fat planes in the upper abdomen are obscured, possibly due to ascites. Musculoskeletal: Degenerative changes in the spine. No acute bony abnormalities. Review of the MIP images confirms the above findings. IMPRESSION: 1. No evidence of significant pulmonary embolus. Dilated central pulmonary arteries suggests pulmonary arterial hypertension. 2. 4 cm ascending thoracic aortic aneurysm. No dissection. Recommend annual imaging followup by CTA or MRA. This recommendation follows 2010 ACCF/AHA/AATS/ACR/ASA/SCA/SCAI/SIR/STS/SVM Guidelines for the Diagnosis and Management of Patients with Thoracic Aortic Disease. Circulation. 2010; 121: Z733-z630. Aortic aneurysm NOS (ICD10-I71.9) 3. Prominent emphysematous changes throughout the lungs. Linear atelectasis or fibrosis in the bases. 4. Cholelithiasis.  Possible ascites in the upper abdomen. Electronically Signed   By: Elsie Gravely M.D.   On: 03/26/2024 15:23   DG Chest 2 View Result Date: 03/26/2024 EXAM: 2 VIEW(S) XRAY OF THE CHEST 03/26/2024 12:22:00 PM COMPARISON: 06/01/2023 CLINICAL HISTORY: shob. FINDINGS: LUNGS AND PLEURA: Stable minimal bibasilar subsegmental atelectasis or scarring.  No pulmonary edema. No pleural effusion. No pneumothorax. HEART AND MEDIASTINUM: Stable cardiomegaly. BONES AND SOFT TISSUES: No acute osseous abnormality.  IMPRESSION: 1. Stable cardiomegaly. 2. Stable minimal bibasilar subsegmental atelectasis or scarring. Electronically signed by: Lynwood Seip MD 03/26/2024 12:36 PM EDT RP Workstation: [id number]     Discharge Instructions: Discharge Instructions     Call MD for:  difficulty breathing, headache or visual disturbances   Complete by: As directed    Call MD for:  extreme fatigue   Complete by: As directed    Call MD for:  persistant dizziness or light-headedness   Complete by: As directed    Call MD for:  severe uncontrolled pain   Complete by: As directed    Diet - low sodium heart healthy   Complete by: As directed    Discharge instructions   Complete by: As directed    You were hospitalized for shortness of breath. Thank you for allowing us  to be part of your care.   Keep your appointments with your primary care doctor, cardiologist, and lung doctor. You can find those listed elswhere  Keep taking your prescribed medications and inhalers.  Please use your oxygen at 3L when you are walking around.   Please call our clinic if you have any questions or concerns, we may be able to help and keep you from a long and expensive emergency room wait. Our clinic and after hours phone number is [phone], the best time to call is Monday through Friday 9 am to 4 pm but there is always someone available 24/7 if you have an emergency. If you need medication refills please notify your pharmacy one week in advance and they will send us  a request.   Increase activity slowly   Complete by: As directed       You were hospitalized for shortness of breath. Thank you for allowing us  to be part of your care.   Keep your appointments with your primary care doctor, cardiologist, and lung doctor. You can find those listed elswhere  Keep taking your prescribed medications and inhalers.  Please use your oxygen at 3L when you are walking around.   Please call our clinic if you have any questions or  concerns, we may be able to help and keep you from a long and expensive emergency room wait. Our clinic and after hours phone number is [phone], the best time to call is Monday through Friday 9 am to 4 pm but there is always someone available 24/7 if you have an emergency. If you need medication refills please notify your pharmacy one week in advance and they will send us  a request.   Signed: Napoleon Limes, MD 03/27/2024, 1:24 PM

## 2024-03-27 NOTE — ED Notes (Addendum)
 On RA pt was 85% during ambulation, was 98% during ambulation on 3L O2 via n/c & AFTER ambulation when standing still was 100% on 3L.

## 2024-03-27 NOTE — Hospital Course (Addendum)
#  Acute hypoxic respiratory failure Patient presented with shortness of breath and malaise that had been present since his recent surgery on 6/17 and worsening since then. His sats dropped to 77% with ambulation in the ED, but remained in the 96-100% range on room air with rest. Covid, flu, RSV negative. Blood cultures negative <24 hours. CTA showed no PE, but did show pulmonary hypertension. Symptoms are exertional, and may represent a worsening of his COPD baseline, as he was not have a COPD or heart failure exacerbation. He received IV Solu-Medrol 25mg  in the ED. Also received Duonebs x2 in the ED. On the day of discharge, he was saturating well on room air but did desaturate to 85% with ambulation. His O2 improved on ambulation to 98% while on 3L [place]. He was discharged with 3L of O2 to be used with ambulation. He does not require any oxygen at rest.    #COPD Home medications Stiolto Respimat  and albuterol  rescue inhaler. Did not have increased sputum to suggest COPD exacerbation. Last saw pulmonology in September of 2025 at which time the plan was to obtain PFTs and refer to pulmonary rehab. He has not previously been on home oxygen, but now will require O2 with ambulation as above. He can continue his home COPD regimen.    #HFmrEF (EF 45-50%) Last echo 05/2023 which showed EF of 45 to 50% with left ventricular regional wall motion abnormalities, decreased right ventricular systolic function and elevated pulmonary artery pressure.  Euvolemic on exam today. Unlikely that he is in exacerbation. Home medications Entresto  97-103mg  and Metoprolol  succinate 25mg  daily. He was advised to not take Metoprolol  if his heart rate was below 60.   #Hypokalemia 3.2 on admission. Repleted with 80 mEq of potassium and improved to 4.3 on the day of discharge   #Hypertension BP on admission 138/67. He was managed on her home medication Amlodipine  10mg  daily, Entresto  97-103mg , and Metoprolol  25mg  daily. BP on the  morning of discharge 113/59.    #AAA repair Underwent initial repair 08/27/2022, and 12/02/23- underwent redo of four-vessel branched graft to treat a type 1a endoleak. CTA showed a 4cm ascending thoracic aortic aneurysm that is stable since prior imaging. It was advised that he schedule a one year follow up imaging with CTA and MRA   #Urinary retention With chronic indwelling foley catheter. UA showed hematuria with WBCs as well. Patient was asymptomatic   #A-fib Rate controlled with home Metoprolol . Not on anticoagulation due to high bleed risk. Rhythm irregular today with bradycardia. Advised to hold Metoprolol  if heart rate below 60.   #HLD Managed with home Rosuvastatin  40mg  daily   #Protein calorie Malnutrition Managed with home Remeron  15mg  and Pancrealipase.  #Depression Managed with home Escitalopram  5mg    #Chronic thrombocytopenia Platelets 78 on morning of discharge, at baseline.

## 2024-03-31 LAB — CULTURE, BLOOD (ROUTINE X 2)
Culture: NO GROWTH
Culture: NO GROWTH
Special Requests: ADEQUATE

## 2024-04-01 ENCOUNTER — Telehealth: Payer: Self-pay | Admitting: *Deleted

## 2024-04-01 NOTE — Telephone Encounter (Signed)
Agree with VO

## 2024-04-01 NOTE — Telephone Encounter (Signed)
 RTC to [place] from [facility].  Would like verbal order to resume Skilled Nursing visits 1 time a month to change patient's Supra-Pubic Catheter.  Verbal Order given.   Will forward to PCP. Copied from CRM [id number]. Topic: Clinical - Home Health Verbal Orders >> Apr 01, 2024  3:03 PM Diannia H wrote: Caller/Agency: Nicole/[facility] Callback Number: [phone] Service Requested: Skilled Nursing Frequency: Once a month  Any new concerns about the patient? No

## 2024-04-02 ENCOUNTER — Encounter: Payer: Self-pay | Admitting: Cardiology

## 2024-04-02 ENCOUNTER — Ambulatory Visit: Attending: Cardiology | Admitting: Cardiology

## 2024-04-02 VITALS — BP 144/60 | HR 72 | Ht 72.0 in | Wt 135.3 lb

## 2024-04-02 DIAGNOSIS — D6869 Other thrombophilia: Secondary | ICD-10-CM | POA: Diagnosis not present

## 2024-04-02 DIAGNOSIS — I4819 Other persistent atrial fibrillation: Secondary | ICD-10-CM | POA: Diagnosis not present

## 2024-04-02 DIAGNOSIS — I10 Essential (primary) hypertension: Secondary | ICD-10-CM

## 2024-04-02 DIAGNOSIS — I2510 Atherosclerotic heart disease of native coronary artery without angina pectoris: Secondary | ICD-10-CM | POA: Diagnosis not present

## 2024-04-02 NOTE — Patient Instructions (Signed)
 Medication Instructions:  Your physician recommends that you continue on your current medications as directed. Please refer to the Current Medication list given to you today.  *If you need a refill on your cardiac medications before your next appointment, please call your pharmacy*  Follow-Up: At [facility], you and your health needs are our priority.  As part of our continuing mission to provide you with exceptional heart care, our providers are all part of one team.  This team includes your primary Cardiologist (physician) and Advanced Practice Providers or APPs (Physician Assistants and Nurse Practitioners) who all work together to provide you with the care you need, when you need it.  Your next appointment:   6 months  Provider:   Ozell Jodie Passey, PA-C

## 2024-04-02 NOTE — Telephone Encounter (Signed)
 Left a message for Nat with [facility] in Reference to the patient needing additional DME equipment for the pt.   Copied from CRM #[id number]. Topic: Clinical - Order For Equipment >> Apr 02, 2024  9:42 AM DeAngela L wrote: Reason for CRM: Ms Nat the nurse with [facility] calling cause she is there to take the patient to an appointment and he doesn't have portable oxygen tank Asking could the office please call [employer] [facility] and order the patient a portable oxygen machine that can go with him to a doctors appointment cause he has appointment today and no portable tank   [facility] [phone] Nat [phone]

## 2024-04-02 NOTE — Progress Notes (Signed)
  Electrophysiology Office Note:   Date:  04/02/2024  ID:  [name], DOB 24-Aug-1945, MRN [id number]  Primary Cardiologist: [name] DELENA Leavens, MD Primary Heart Failure: None Electrophysiologist: [name] Cashatt Gladis Norton, MD      History of Present Illness:   [name] is a 78 y.o. male with h/o AAA postrepair, thrombocytopenia, chronic anemia, atrial fibrillation, coronary artery disease seen today for routine electrophysiology followup.   Since last being seen in our clinic the patient reports doing overall well.  He does have baseline shortness of breath which is mild.  He has no acute complaints.  He continues to do his daily activities without restriction.  he denies chest pain, palpitations, dyspnea, PND, orthopnea, nausea, vomiting, dizziness, syncope, edema, weight gain, or early satiety.   Review of systems complete and found to be negative unless listed in HPI.   EP Information / Studies Reviewed:    EKG is not ordered today. EKG from 03/26/2024 reviewed which showed atrial flutter, rate 62       Risk Assessment/Calculations:    CHA2DS2-VASc Score = 5   This indicates a 7.2% annual risk of stroke. The patient's score is based upon: CHF History: 1 HTN History: 1 Diabetes History: 0 Stroke History: 0 Vascular Disease History: 1 Age Score: 2 Gender Score: 0            Physical Exam:   VS:  BP (!) 144/60 (BP Location: Right Arm, Patient Position: Sitting, Cuff Size: Normal)   Pulse 72   Ht 6' (1.829 m)   Wt 135 lb 4.8 oz (61.4 kg)   SpO2 99%   BMI 18.35 kg/m    Wt Readings from Last 3 Encounters:  04/02/24 135 lb 4.8 oz (61.4 kg)  03/26/24 136 lb (61.7 kg)  03/15/24 136 lb (61.7 kg)     GEN: Well nourished, well developed in no acute distress NECK: No JVD; No carotid bruits CARDIAC: Irregularly irregular rate and rhythm, no murmurs, rubs, gallops RESPIRATORY:  Clear to auscultation without rales, wheezing or rhonchi  ABDOMEN: Soft, non-tender,  non-distended EXTREMITIES:  No edema; No deformity   ASSESSMENT AND PLAN:    1.  Persistent atrial fibrillation: On metoprolol .  He is minimally symptomatic.  [name] plan for continued rate control.  2.  Secondary hypercoagulable state: Not anticoagulated currently due to multiple comorbidities.  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  Continue monitoring.  4.  Coronary artery disease: Has follow-up with general cardiology  5.  Peripheral arterial disease: Follows with vascular surgery.  Was previously deemed not a candidate for anticoagulation due to recent surgery.  [name] touch base with vascular surgery to see if Eliquis would be reasonable.  Follow up with EP APP in 6 months  Signed, [name] Kipper Gladis Norton, MD

## 2024-04-08 ENCOUNTER — Other Ambulatory Visit: Payer: Self-pay | Admitting: Internal Medicine

## 2024-04-08 MED ORDER — ZENPEP 20000-63000 UNITS PO CPEP: 3.0000 | ORAL_CAPSULE | Freq: Three times a day (TID) | ORAL | 0 refills | Status: DC

## 2024-04-08 NOTE — Telephone Encounter (Unsigned)
 Copied from CRM [id number]. Topic: Clinical - Medication Refill >> Apr 08, 2024  2:11 PM Fredrica W wrote: Medication:  Pancrelipase , Lip-Prot-Amyl, (ZENPEP ) 20000-63000 units CPEP HYDROcodone -acetaminophen  (NORCO) 7.5-325 MG tablet  Has the patient contacted their pharmacy? No (Agent: If no, request that the patient contact the pharmacy for the refill. If patient does not wish to contact the pharmacy document the reason why and proceed with request.) (Agent: If yes, when and what did the pharmacy advise?)  This is the patient's preferred pharmacy:  [id number] - [place], [place] - [address] [facility] [address] [place] [address] Phone: [phone] Fax: [phone]   Is this the correct pharmacy for this prescription? Yes If no, delete pharmacy and type the correct one.   Has the prescription been filled recently? Yes  Is the patient out of the medication? No - few days left  Has the patient been seen for an appointment in the last year OR does the patient have an upcoming appointment? Yes  Can we respond through MyChart? No  Agent: Please be advised that Rx refills may take up to 3 business days. We ask that you follow-up with your pharmacy.

## 2024-04-08 NOTE — Telephone Encounter (Signed)
 LOV - 12/22/23. Next appt scheduled 04/26/24.

## 2024-04-19 ENCOUNTER — Other Ambulatory Visit: Payer: Self-pay | Admitting: Internal Medicine

## 2024-04-19 DIAGNOSIS — I10 Essential (primary) hypertension: Secondary | ICD-10-CM

## 2024-04-19 MED ORDER — SACUBITRIL-VALSARTAN 97-103 MG PO TABS: 1.0000 | ORAL_TABLET | Freq: Two times a day (BID) | ORAL | 3 refills | Status: AC

## 2024-04-19 NOTE — Telephone Encounter (Signed)
 Copied from CRM #[id number]. Topic: Clinical - Medication Refill >> Apr 19, 2024 10:33 AM Chiquita SQUIBB wrote: Medication: sacubitril -valsartan  (ENTRESTO ) 97-103 MG  Has the patient contacted their pharmacy? Yes (Agent: If no, request that the patient contact the pharmacy for the refill. If patient does not wish to contact the pharmacy document the reason why and proceed with request.) (Agent: If yes, when and what did the pharmacy advise?)  This is the patient's preferred pharmacy:  [id number] - [place],  - [address] AT [facility] [address] [place] [address] Phone: [phone] Fax: [phone]   Is this the correct pharmacy for this prescription? Yes If no, delete pharmacy and type the correct one.   Has the prescription been filled recently? No  Is the patient out of the medication? No  Has the patient been seen for an appointment in the last year OR does the patient have an upcoming appointment? Yes  Can we respond through MyChart? No  Agent: Please be advised that Rx refills may take up to 3 business days. We ask that you follow-up with your pharmacy.

## 2024-04-23 ENCOUNTER — Ambulatory Visit (HOSPITAL_COMMUNITY)
Admission: RE | Admit: 2024-04-23 | Discharge: 2024-04-23 | Disposition: A | Discharge status: Home / Self Care | Source: Ambulatory Visit | Attending: Internal Medicine | Admitting: Internal Medicine

## 2024-04-23 DIAGNOSIS — I5022 Chronic systolic (congestive) heart failure: Secondary | ICD-10-CM | POA: Insufficient documentation

## 2024-04-23 LAB — ECHOCARDIOGRAM COMPLETE
Area-P 1/2: 4.13 cm2
MV M vel: 5.75 m/s
MV Peak grad: 132.3 mmHg
S' Lateral: 3.75 cm

## 2024-04-26 ENCOUNTER — Encounter: Payer: Self-pay | Admitting: Internal Medicine

## 2024-04-26 ENCOUNTER — Ambulatory Visit: Admitting: Internal Medicine

## 2024-04-26 VITALS — BP 110/74 | HR 83 | Temp 97.5°F | Ht 72.0 in | Wt 140.6 lb

## 2024-04-26 DIAGNOSIS — Z9981 Dependence on supplemental oxygen: Secondary | ICD-10-CM | POA: Diagnosis not present

## 2024-04-26 DIAGNOSIS — Z79899 Other long term (current) drug therapy: Secondary | ICD-10-CM

## 2024-04-26 DIAGNOSIS — F17210 Nicotine dependence, cigarettes, uncomplicated: Secondary | ICD-10-CM

## 2024-04-26 DIAGNOSIS — Z7951 Long term (current) use of inhaled steroids: Secondary | ICD-10-CM | POA: Diagnosis not present

## 2024-04-26 DIAGNOSIS — J9601 Acute respiratory failure with hypoxia: Secondary | ICD-10-CM | POA: Diagnosis not present

## 2024-04-26 DIAGNOSIS — J441 Chronic obstructive pulmonary disease with (acute) exacerbation: Secondary | ICD-10-CM

## 2024-04-26 NOTE — Assessment & Plan Note (Addendum)
 [employer] for ambulatory o2 but unable to carry portable O2 tank.  Will see if he can get a lighter portable O2 device. Orders:   Ambulatory Referral for DME

## 2024-04-26 NOTE — Assessment & Plan Note (Addendum)
 Concern that incruse inhaler has 0 on meter, I discussed with him this was empty and he was unware.  Reviewed inhaler prescriptions with him, he should be using Stiolto and albuterol  PRN Orders:   Ambulatory Referral for DME

## 2024-04-26 NOTE — Progress Notes (Signed)
 Subjective:  HPI: Chief Complaint  Patient presents with   Follow-up    Need portable O2 tank. SOB w/o oxygen (3L at home).    Discussed the use of AI scribe software for clinical note transcription with the patient, who gave verbal consent to proceed.  History of Present Illness [name] is a 78 year old male with COPD who presents with difficulty managing oxygen therapy and shortness of breath.  [name] has been on oxygen therapy since his recent hospital discharge and currently uses large oxygen tanks at home, which [name] finds cumbersome and difficult to transport, limiting his mobility outside the home. [name] needs a portable oxygen device to facilitate walking and other activities.  [name] experiences shortness of breath with minimal exertion, such as walking short distances, which requires him to stop and rest. While [name] can perform household activities like cleaning and cooking while on oxygen at home, his mobility is significantly restricted outside due to the lack of a portable oxygen solution.  [name] uses multiple inhalers, including Incruse, Stiolto, and Albuterol . [name] takes two puffs of the inhalers daily, but there is confusion about the status of his Incruse inhaler, which appears to be empty. Albuterol  is used as needed for shortness of breath.  [name] smokes approximately five to seven cigarettes a day, despite being on oxygen therapy. [name] does not smoke with the oxygen on.  His past medical history includes COPD, atrial fibrillation, heart failure, and a history of prostate cancer.  [name] discusses his end-of-life preferences, indicating that his son is aware of his insurance and burial plans. [name] prefers to be buried and has considered the implications of his health conditions on his quality of life.     Please see Assessment and Plan below for the status of his chronic medical problems.  Objective:  Physical Exam: Vitals:   04/26/24 1059  BP: 110/74  Pulse: 83  Temp: (!) 97.5 F  (36.4 C)  TempSrc: Oral  SpO2: 98%  Weight: 140 lb 9.6 oz (63.8 kg)  Height: 6' (1.829 m)   Body mass index is 19.07 kg/m. Physical Exam Vitals and nursing note reviewed.  Constitutional:      Appearance: Normal appearance.     Comments: Heavy odor of tobacco smoke  Cardiovascular:     Rate and Rhythm: Normal rate.  Pulmonary:     Effort: No respiratory distress.     Breath sounds: No stridor. No wheezing.  Neurological:     Mental Status: [name] is alert.  Psychiatric:        Mood and Affect: Mood normal.    Results  No results found for any visits on 04/26/24.  The ASCVD Risk score (Arnett DK, et al., 2019) failed to calculate for the following reasons:   Risk score cannot be calculated because patient has a medical history suggesting prior/existing ASCVD  Assessment & Plan:  See Encounters Tab for problem based charting. Assessment & Plan COPD exacerbation (HCC) Concern that incruse inhaler has 0 on meter, I discussed with him this was empty and [name] was unware.  Reviewed inhaler prescriptions with him, [name] should be using Stiolto and albuterol  PRN Orders:   Ambulatory Referral for DME  Acute hypoxic respiratory failure (HCC) [employer] for ambulatory o2 but unable to carry portable O2 tank.  Will see if [name] can get a lighter portable O2 device. Orders:   Ambulatory Referral for DME    Medications Ordered No orders of the defined types were placed in this encounter.  Other Orders Orders Placed This Encounter  Procedures   Ambulatory Referral for DME    Referral Priority:   Routine    Referral Type:   Durable Medical Equipment Purchase    Number of Visits Requested:   1   Follow Up: No follow-ups on file.

## 2024-05-07 ENCOUNTER — Other Ambulatory Visit: Payer: Self-pay

## 2024-05-07 ENCOUNTER — Encounter (HOSPITAL_COMMUNITY): Payer: Self-pay

## 2024-05-07 ENCOUNTER — Emergency Department (HOSPITAL_COMMUNITY)

## 2024-05-07 ENCOUNTER — Inpatient Hospital Stay (HOSPITAL_COMMUNITY)
Admission: EM | Admit: 2024-05-07 | Discharge: 2024-05-14 | DRG: 193 | Disposition: A | Discharge status: Home / Self Care | Attending: Internal Medicine | Admitting: Internal Medicine

## 2024-05-07 ENCOUNTER — Inpatient Hospital Stay (HOSPITAL_COMMUNITY)

## 2024-05-07 DIAGNOSIS — F1010 Alcohol abuse, uncomplicated: Secondary | ICD-10-CM | POA: Diagnosis present

## 2024-05-07 DIAGNOSIS — Z8249 Family history of ischemic heart disease and other diseases of the circulatory system: Secondary | ICD-10-CM

## 2024-05-07 DIAGNOSIS — D696 Thrombocytopenia, unspecified: Secondary | ICD-10-CM | POA: Diagnosis present

## 2024-05-07 DIAGNOSIS — E785 Hyperlipidemia, unspecified: Secondary | ICD-10-CM | POA: Diagnosis present

## 2024-05-07 DIAGNOSIS — J441 Chronic obstructive pulmonary disease with (acute) exacerbation: Principal | ICD-10-CM | POA: Diagnosis present

## 2024-05-07 DIAGNOSIS — R54 Age-related physical debility: Secondary | ICD-10-CM | POA: Diagnosis present

## 2024-05-07 DIAGNOSIS — I5043 Acute on chronic combined systolic (congestive) and diastolic (congestive) heart failure: Secondary | ICD-10-CM | POA: Diagnosis present

## 2024-05-07 DIAGNOSIS — Z7902 Long term (current) use of antithrombotics/antiplatelets: Secondary | ICD-10-CM

## 2024-05-07 DIAGNOSIS — I714 Abdominal aortic aneurysm, without rupture, unspecified: Secondary | ICD-10-CM | POA: Diagnosis present

## 2024-05-07 DIAGNOSIS — I130 Hypertensive heart and chronic kidney disease with heart failure and stage 1 through stage 4 chronic kidney disease, or unspecified chronic kidney disease: Secondary | ICD-10-CM | POA: Diagnosis not present

## 2024-05-07 DIAGNOSIS — W44F9XA Other object of natural or organic material, entering into or through a natural orifice, initial encounter: Secondary | ICD-10-CM | POA: Diagnosis present

## 2024-05-07 DIAGNOSIS — Z681 Body mass index (BMI) 19 or less, adult: Secondary | ICD-10-CM

## 2024-05-07 DIAGNOSIS — Z79899 Other long term (current) drug therapy: Secondary | ICD-10-CM

## 2024-05-07 DIAGNOSIS — R636 Underweight: Secondary | ICD-10-CM | POA: Diagnosis present

## 2024-05-07 DIAGNOSIS — Z888 Allergy status to other drugs, medicaments and biological substances status: Secondary | ICD-10-CM

## 2024-05-07 DIAGNOSIS — Z860101 Personal history of adenomatous and serrated colon polyps: Secondary | ICD-10-CM

## 2024-05-07 DIAGNOSIS — J9601 Acute respiratory failure with hypoxia: Secondary | ICD-10-CM | POA: Diagnosis present

## 2024-05-07 DIAGNOSIS — I4891 Unspecified atrial fibrillation: Secondary | ICD-10-CM | POA: Diagnosis present

## 2024-05-07 DIAGNOSIS — I252 Old myocardial infarction: Secondary | ICD-10-CM

## 2024-05-07 DIAGNOSIS — Z85118 Personal history of other malignant neoplasm of bronchus and lung: Secondary | ICD-10-CM | POA: Diagnosis not present

## 2024-05-07 DIAGNOSIS — N179 Acute kidney failure, unspecified: Secondary | ICD-10-CM | POA: Diagnosis present

## 2024-05-07 DIAGNOSIS — K8689 Other specified diseases of pancreas: Secondary | ICD-10-CM | POA: Diagnosis present

## 2024-05-07 DIAGNOSIS — T17590A Other foreign object in bronchus causing asphyxiation, initial encounter: Secondary | ICD-10-CM | POA: Diagnosis present

## 2024-05-07 DIAGNOSIS — J9621 Acute and chronic respiratory failure with hypoxia: Secondary | ICD-10-CM | POA: Diagnosis present

## 2024-05-07 DIAGNOSIS — M86671 Other chronic osteomyelitis, right ankle and foot: Secondary | ICD-10-CM | POA: Diagnosis present

## 2024-05-07 DIAGNOSIS — Z8489 Family history of other specified conditions: Secondary | ICD-10-CM

## 2024-05-07 DIAGNOSIS — J810 Acute pulmonary edema: Secondary | ICD-10-CM | POA: Diagnosis present

## 2024-05-07 DIAGNOSIS — F17210 Nicotine dependence, cigarettes, uncomplicated: Secondary | ICD-10-CM | POA: Diagnosis present

## 2024-05-07 DIAGNOSIS — E876 Hypokalemia: Secondary | ICD-10-CM | POA: Diagnosis present

## 2024-05-07 DIAGNOSIS — D631 Anemia in chronic kidney disease: Secondary | ICD-10-CM | POA: Diagnosis present

## 2024-05-07 DIAGNOSIS — Z8261 Family history of arthritis: Secondary | ICD-10-CM

## 2024-05-07 DIAGNOSIS — J9811 Atelectasis: Secondary | ICD-10-CM | POA: Diagnosis not present

## 2024-05-07 DIAGNOSIS — Z9359 Other cystostomy status: Secondary | ICD-10-CM

## 2024-05-07 DIAGNOSIS — Z7982 Long term (current) use of aspirin: Secondary | ICD-10-CM

## 2024-05-07 DIAGNOSIS — M109 Gout, unspecified: Secondary | ICD-10-CM | POA: Diagnosis present

## 2024-05-07 DIAGNOSIS — Z91013 Allergy to seafood: Secondary | ICD-10-CM

## 2024-05-07 DIAGNOSIS — Z1152 Encounter for screening for COVID-19: Secondary | ICD-10-CM

## 2024-05-07 DIAGNOSIS — Z7951 Long term (current) use of inhaled steroids: Secondary | ICD-10-CM

## 2024-05-07 DIAGNOSIS — F17200 Nicotine dependence, unspecified, uncomplicated: Secondary | ICD-10-CM | POA: Diagnosis present

## 2024-05-07 DIAGNOSIS — J189 Pneumonia, unspecified organism: Principal | ICD-10-CM | POA: Diagnosis present

## 2024-05-07 DIAGNOSIS — I4892 Unspecified atrial flutter: Secondary | ICD-10-CM | POA: Diagnosis present

## 2024-05-07 DIAGNOSIS — I2510 Atherosclerotic heart disease of native coronary artery without angina pectoris: Secondary | ICD-10-CM | POA: Diagnosis present

## 2024-05-07 DIAGNOSIS — Z955 Presence of coronary angioplasty implant and graft: Secondary | ICD-10-CM

## 2024-05-07 DIAGNOSIS — Z902 Acquired absence of lung [part of]: Secondary | ICD-10-CM

## 2024-05-07 DIAGNOSIS — Z833 Family history of diabetes mellitus: Secondary | ICD-10-CM

## 2024-05-07 DIAGNOSIS — K219 Gastro-esophageal reflux disease without esophagitis: Secondary | ICD-10-CM | POA: Diagnosis present

## 2024-05-07 DIAGNOSIS — I4819 Other persistent atrial fibrillation: Secondary | ICD-10-CM | POA: Diagnosis present

## 2024-05-07 DIAGNOSIS — J440 Chronic obstructive pulmonary disease with acute lower respiratory infection: Secondary | ICD-10-CM | POA: Diagnosis present

## 2024-05-07 DIAGNOSIS — N1832 Chronic kidney disease, stage 3b: Secondary | ICD-10-CM | POA: Diagnosis present

## 2024-05-07 DIAGNOSIS — K2270 Barrett's esophagus without dysplasia: Secondary | ICD-10-CM | POA: Diagnosis present

## 2024-05-07 LAB — BASIC METABOLIC PANEL WITH GFR
Anion gap: 11 (ref 5–15)
Anion gap: 11 (ref 5–15)
Anion gap: 11 (ref 5–15)
BUN: 22 mg/dL (ref 8–23)
BUN: 23 mg/dL (ref 8–23)
BUN: 24 mg/dL — ABNORMAL HIGH (ref 8–23)
CO2: 24 mmol/L (ref 22–32)
CO2: 24 mmol/L (ref 22–32)
CO2: 24 mmol/L (ref 22–32)
Calcium: 8.7 mg/dL — ABNORMAL LOW (ref 8.9–10.3)
Calcium: 8.6 mg/dL — ABNORMAL LOW (ref 8.9–10.3)
Calcium: 8.7 mg/dL — ABNORMAL LOW (ref 8.9–10.3)
Chloride: 103 mmol/L (ref 98–111)
Chloride: 100 mmol/L (ref 98–111)
Chloride: 100 mmol/L (ref 98–111)
Creatinine, Ser: 2.04 mg/dL — ABNORMAL HIGH (ref 0.61–1.24)
Creatinine, Ser: 1.98 mg/dL — ABNORMAL HIGH (ref 0.61–1.24)
Creatinine, Ser: 2.04 mg/dL — ABNORMAL HIGH (ref 0.61–1.24)
GFR, Estimated: 33 mL/min — ABNORMAL LOW (ref >60)
GFR, Estimated: 34 mL/min — ABNORMAL LOW (ref >60)
GFR, Estimated: 33 mL/min — ABNORMAL LOW (ref >60)
Glucose, Bld: 88 mg/dL (ref 70–99)
Glucose, Bld: 124 mg/dL — ABNORMAL HIGH (ref 70–99)
Glucose, Bld: 128 mg/dL — ABNORMAL HIGH (ref 70–99)
Potassium: 3.4 mmol/L — ABNORMAL LOW (ref 3.5–5.1)
Potassium: 3.6 mmol/L (ref 3.5–5.1)
Potassium: 3.6 mmol/L (ref 3.5–5.1)
Sodium: 138 mmol/L (ref 135–145)
Sodium: 134 mmol/L — ABNORMAL LOW (ref 135–145)
Sodium: 136 mmol/L (ref 135–145)

## 2024-05-07 LAB — TROPONIN T, HIGH SENSITIVITY
Troponin T High Sensitivity: 79 ng/L — ABNORMAL HIGH (ref 0–19)
Troponin T High Sensitivity: 77 ng/L — ABNORMAL HIGH (ref 0–19)

## 2024-05-07 LAB — CBC
HCT: 29.8 % — ABNORMAL LOW (ref 39.0–52.0)
Hemoglobin: 9.2 g/dL — ABNORMAL LOW (ref 13.0–17.0)
MCH: 31 pg (ref 26.0–34.0)
MCHC: 30.9 g/dL (ref 30.0–36.0)
MCV: 100.3 fL — ABNORMAL HIGH (ref 80.0–100.0)
Platelets: 78 K/uL — ABNORMAL LOW (ref 150–400)
RBC: 2.97 MIL/uL — ABNORMAL LOW (ref 4.22–5.81)
RDW: 15.9 % — ABNORMAL HIGH (ref 11.5–15.5)
WBC: 14.5 K/uL — ABNORMAL HIGH (ref 4.0–10.5)
nRBC: 0 % (ref 0.0–0.2)

## 2024-05-07 LAB — RESP PANEL BY RT-PCR (RSV, FLU A&B, COVID)  RVPGX2
Influenza A by PCR: NEGATIVE
Influenza B by PCR: NEGATIVE
Resp Syncytial Virus by PCR: NEGATIVE
SARS Coronavirus 2 by RT PCR: NEGATIVE

## 2024-05-07 LAB — MRSA NEXT GEN BY PCR, NASAL: MRSA by PCR Next Gen: NOT DETECTED

## 2024-05-07 LAB — PRO BRAIN NATRIURETIC PEPTIDE: Pro Brain Natriuretic Peptide: 18466 pg/mL — ABNORMAL HIGH (ref <300.0)

## 2024-05-07 MED ORDER — ACETAMINOPHEN 650 MG RE SUPP: 650.0000 mg | Freq: Four times a day (QID) | RECTAL | Status: DC | PRN

## 2024-05-07 MED ORDER — SODIUM CHLORIDE 3 % IN NEBU
4.0000 mL | INHALATION_SOLUTION | Freq: Two times a day (BID) | RESPIRATORY_TRACT | Status: AC
Start: 2024-05-07 — End: 2024-05-10
  Administered 2024-05-07 – 2024-05-10 (×6): 4 mL via RESPIRATORY_TRACT
  Filled 2024-05-07 (×5): qty 4

## 2024-05-07 MED ORDER — MIRTAZAPINE 15 MG PO TABS
15.0000 mg | ORAL_TABLET | Freq: Every day | ORAL | Status: DC
  Administered 2024-05-07 – 2024-05-13 (×7): 15 mg via ORAL
  Filled 2024-05-07 (×7): qty 1

## 2024-05-07 MED ORDER — NICOTINE 14 MG/24HR TD PT24
14.0000 mg | MEDICATED_PATCH | Freq: Every day | TRANSDERMAL | Status: DC
  Administered 2024-05-07 – 2024-05-14 (×8): 14 mg via TRANSDERMAL
  Filled 2024-05-07 (×8): qty 1

## 2024-05-07 MED ORDER — METOPROLOL SUCCINATE ER 25 MG PO TB24
25.0000 mg | ORAL_TABLET | Freq: Every day | ORAL | Status: DC
  Administered 2024-05-07 – 2024-05-14 (×8): 25 mg via ORAL
  Filled 2024-05-07 (×8): qty 1

## 2024-05-07 MED ORDER — ENOXAPARIN SODIUM 30 MG/0.3ML IJ SOSY
30.0000 mg | PREFILLED_SYRINGE | INTRAMUSCULAR | Status: DC
  Administered 2024-05-07 – 2024-05-14 (×8): 30 mg via SUBCUTANEOUS
  Filled 2024-05-07 (×8): qty 0.3

## 2024-05-07 MED ORDER — IPRATROPIUM-ALBUTEROL 0.5-2.5 (3) MG/3ML IN SOLN: 3.0000 mL | Freq: Four times a day (QID) | RESPIRATORY_TRACT | Status: DC

## 2024-05-07 MED ORDER — IPRATROPIUM-ALBUTEROL 0.5-2.5 (3) MG/3ML IN SOLN
3.0000 mL | Freq: Four times a day (QID) | RESPIRATORY_TRACT | Status: DC
  Administered 2024-05-07 – 2024-05-10 (×14): 3 mL via RESPIRATORY_TRACT
  Filled 2024-05-07 (×16): qty 3

## 2024-05-07 MED ORDER — ESCITALOPRAM OXALATE 10 MG PO TABS
5.0000 mg | ORAL_TABLET | Freq: Every day | ORAL | Status: DC
  Administered 2024-05-07 – 2024-05-14 (×8): 5 mg via ORAL
  Filled 2024-05-07: qty 1
  Filled 2024-05-07: qty 0.5
  Filled 2024-05-07: qty 1
  Filled 2024-05-07: qty 0.5
  Filled 2024-05-07 (×2): qty 1
  Filled 2024-05-07: qty 0.5
  Filled 2024-05-07: qty 1

## 2024-05-07 MED ORDER — CLOPIDOGREL BISULFATE 75 MG PO TABS
75.0000 mg | ORAL_TABLET | Freq: Every day | ORAL | Status: DC
  Administered 2024-05-07 – 2024-05-10 (×4): 75 mg via ORAL
  Filled 2024-05-07 (×4): qty 1

## 2024-05-07 MED ORDER — PANCRELIPASE (LIP-PROT-AMYL) 12000-38000 UNITS PO CPEP
60000.0000 [IU] | ORAL_CAPSULE | Freq: Three times a day (TID) | ORAL | Status: DC
  Administered 2024-05-07 – 2024-05-14 (×21): 60000 [IU] via ORAL
  Filled 2024-05-07 (×3): qty 5
  Filled 2024-05-07: qty 1
  Filled 2024-05-07 (×22): qty 5

## 2024-05-07 MED ORDER — AZITHROMYCIN 250 MG PO TABS
500.0000 mg | ORAL_TABLET | Freq: Every day | ORAL | Status: DC
  Administered 2024-05-07 – 2024-05-14 (×8): 500 mg via ORAL
  Filled 2024-05-07 (×8): qty 2

## 2024-05-07 MED ORDER — ARFORMOTEROL TARTRATE 15 MCG/2ML IN NEBU
15.0000 ug | INHALATION_SOLUTION | Freq: Two times a day (BID) | RESPIRATORY_TRACT | Status: DC
  Administered 2024-05-07 – 2024-05-13 (×14): 15 ug via RESPIRATORY_TRACT
  Filled 2024-05-07 (×14): qty 2

## 2024-05-07 MED ORDER — ONDANSETRON HCL 4 MG/2ML IJ SOLN: 4.0000 mg | Freq: Four times a day (QID) | INTRAMUSCULAR | Status: DC | PRN

## 2024-05-07 MED ORDER — SACUBITRIL-VALSARTAN 97-103 MG PO TABS
1.0000 | ORAL_TABLET | Freq: Two times a day (BID) | ORAL | Status: DC
  Administered 2024-05-07 – 2024-05-14 (×15): 1 via ORAL
  Filled 2024-05-07 (×15): qty 1

## 2024-05-07 MED ORDER — PREDNISONE 20 MG PO TABS
50.0000 mg | ORAL_TABLET | Freq: Every day | ORAL | Status: DC
  Administered 2024-05-07 – 2024-05-08 (×2): 50 mg via ORAL
  Filled 2024-05-07 (×2): qty 1

## 2024-05-07 MED ORDER — AMLODIPINE BESYLATE 10 MG PO TABS
10.0000 mg | ORAL_TABLET | Freq: Every day | ORAL | Status: DC
  Administered 2024-05-07: 10 mg via ORAL
  Filled 2024-05-07: qty 1
  Filled 2024-05-07: qty 2

## 2024-05-07 MED ORDER — ORAL CARE MOUTH RINSE
15.0000 mL | OROMUCOSAL | Status: DC | PRN
Start: 2024-05-07 — End: 2024-05-14

## 2024-05-07 MED ORDER — PANTOPRAZOLE SODIUM 40 MG PO TBEC
40.0000 mg | DELAYED_RELEASE_TABLET | Freq: Every day | ORAL | Status: DC
  Administered 2024-05-07 – 2024-05-14 (×8): 40 mg via ORAL
  Filled 2024-05-07 (×8): qty 1

## 2024-05-07 MED ORDER — HYDROCODONE-ACETAMINOPHEN 7.5-325 MG PO TABS
1.0000 | ORAL_TABLET | Freq: Two times a day (BID) | ORAL | Status: DC | PRN
  Administered 2024-05-07 – 2024-05-13 (×5): 1 via ORAL
  Filled 2024-05-07 (×5): qty 1

## 2024-05-07 MED ORDER — ASPIRIN 81 MG PO TBEC
81.0000 mg | DELAYED_RELEASE_TABLET | Freq: Every day | ORAL | Status: DC
  Administered 2024-05-07 – 2024-05-14 (×8): 81 mg via ORAL
  Filled 2024-05-07 (×8): qty 1

## 2024-05-07 MED ORDER — ALBUTEROL SULFATE (2.5 MG/3ML) 0.083% IN NEBU
5.0000 mg | INHALATION_SOLUTION | Freq: Once | RESPIRATORY_TRACT | Status: AC
  Administered 2024-05-07: 5 mg via RESPIRATORY_TRACT
  Filled 2024-05-07: qty 6

## 2024-05-07 MED ORDER — BUDESONIDE 0.25 MG/2ML IN SUSP
0.2500 mg | Freq: Two times a day (BID) | RESPIRATORY_TRACT | Status: DC
  Administered 2024-05-07 – 2024-05-13 (×14): 0.25 mg via RESPIRATORY_TRACT
  Filled 2024-05-07 (×14): qty 2

## 2024-05-07 MED ORDER — CHLORHEXIDINE GLUCONATE CLOTH 2 % EX PADS
6.0000 | MEDICATED_PAD | Freq: Every day | CUTANEOUS | Status: DC
  Administered 2024-05-07 – 2024-05-13 (×6): 6 via TOPICAL

## 2024-05-07 MED ORDER — POTASSIUM CHLORIDE 20 MEQ PO PACK
40.0000 meq | PACK | Freq: Once | ORAL | Status: AC
  Administered 2024-05-07: 40 meq via ORAL
  Filled 2024-05-07: qty 2

## 2024-05-07 MED ORDER — ACETAMINOPHEN 325 MG PO TABS
650.0000 mg | ORAL_TABLET | Freq: Four times a day (QID) | ORAL | Status: DC | PRN
  Administered 2024-05-07 – 2024-05-09 (×2): 650 mg via ORAL
  Filled 2024-05-07 (×2): qty 2

## 2024-05-07 MED ORDER — SODIUM CHLORIDE 0.9 % IV SOLN
1.0000 g | Freq: Every day | INTRAVENOUS | Status: DC
  Administered 2024-05-07 – 2024-05-14 (×8): 1 g via INTRAVENOUS
  Filled 2024-05-07 (×8): qty 10

## 2024-05-07 MED ORDER — ROSUVASTATIN CALCIUM 20 MG PO TABS
40.0000 mg | ORAL_TABLET | Freq: Every day | ORAL | Status: DC
  Administered 2024-05-07 – 2024-05-14 (×8): 40 mg via ORAL
  Filled 2024-05-07 (×8): qty 2

## 2024-05-07 MED ORDER — ONDANSETRON HCL 4 MG PO TABS: 4.0000 mg | ORAL_TABLET | Freq: Four times a day (QID) | ORAL | Status: DC | PRN

## 2024-05-07 MED ORDER — FUROSEMIDE 10 MG/ML IJ SOLN
40.0000 mg | Freq: Once | INTRAMUSCULAR | Status: AC
Start: 2024-05-07 — End: 2024-05-07
  Administered 2024-05-07: 40 mg via INTRAVENOUS
  Filled 2024-05-07: qty 4

## 2024-05-07 MED ORDER — IPRATROPIUM BROMIDE 0.02 % IN SOLN
0.5000 mg | Freq: Once | RESPIRATORY_TRACT | Status: AC
  Administered 2024-05-07: 0.5 mg via RESPIRATORY_TRACT
  Filled 2024-05-07: qty 2.5

## 2024-05-07 MED ORDER — FUROSEMIDE 10 MG/ML IJ SOLN
60.0000 mg | Freq: Once | INTRAMUSCULAR | Status: AC
  Administered 2024-05-07: 60 mg via INTRAVENOUS
  Filled 2024-05-07: qty 8

## 2024-05-07 MED ORDER — ACETYLCYSTEINE 20 % IN SOLN
2.0000 mL | Freq: Two times a day (BID) | RESPIRATORY_TRACT | Status: DC
  Administered 2024-05-07 – 2024-05-13 (×13): 2 mL via RESPIRATORY_TRACT
  Filled 2024-05-07 (×14): qty 4

## 2024-05-07 NOTE — ED Notes (Signed)
 Respiratory contacted. RN instructed to run budesonide  treatment last

## 2024-05-07 NOTE — H&P (Signed)
 History and Physical    [name]:[id number] DOB: [birth year] DOA: 05/07/2024  PCP: [name], DO   Chief Complaint: sob  HPI: [name] is a 78 y.o. male with medical history significant of abdominal aortic aneurysm, alcohol usage, CAD, GERD, hypertension, hyperlipidemia, lung cancer who presented to the emergency department with shortness of breath.  Patient was admitted in October.  During this hospitalization he was treated for COPD and discharged with outpatient follow-up.  He presented today due to progressively worsening shortness of breath.  Patient's had worsening wheezing over the last day and has been using home nebulizers without improvement in symptoms.  He presented to the emergency department where he was found to be afebrile and hemodynamically stable.  Labs were [facility] which showed BNP 18,488, potassium 3.4, creatinine 2.04 with baseline around 1.5, hemoglobin 9.2, troponin 79.  Patient underwent chest x-ray which showed opacification of right hemithorax concerning for pulmonary edema.  Patient was given IV Lasix  and admitted for further work up.  On admission he endorsed productive cough with perceived hypervolemia.  No infectious .ymptoms   Review of Systems: Review of Systems  Constitutional:  Negative for chills and fever.  HENT: Negative.    Eyes: Negative.   Respiratory:  Positive for cough, sputum production and shortness of breath.   Cardiovascular: Negative.   Gastrointestinal: Negative.   Genitourinary: Negative.   Musculoskeletal: Negative.   Skin: Negative.   Neurological: Negative.   Endo/Heme/Allergies: Negative.   Psychiatric/Behavioral: Negative.       As per HPI otherwise 10 point review of systems negative.   Allergies  Allergen Reactions   Candesartan Cilexetil-Hctz Other (See Comments)    Acute renal failure   Hydrochlorothiazide Other (See Comments)    Acute renal failure   Shellfish Allergy Other (See Comments)    Gout  occurs   Betadine [Povidone Iodine ] Itching    Past Medical History:  Diagnosis Date   AAA (abdominal aortic aneurysm)    followed by vasc surgery   Alcohol abuse    Barretts esophagus    bx distal esophagus 2011 neg    Bladder outlet obstruction 04/02/2018   CAD in native artery    a. s/p stent to prox RCA and mid Cx.   Cataract    Cholelithiasis    Chronic osteomyelitis involving ankle and foot, right (HCC) 07/13/2018   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   2006 - 3 adenomas largest 12 mm  2008 - no polyps 2013-  2 mm cecal polyp, adenoma, routine repeat colonoscopy 02/2017 approx 05/14/2017 18 polyps max 12 mm ALL adenomas recall 2019 [name], MD, [facility]      Dieulafoy lesion of duodenum    Diverticulosis of colon    DJD (degenerative joint disease)    left hip   Full dentures    upper and lower   GERD (gastroesophageal reflux disease)    diet controlled    Gout    feet   Hiatal hernia    2 cm noted on upper endos 2011    Hx of adenomatous colonic polyps    Hyperlipidemia    Hypertension    Internal hemorrhoid    Lung cancer (HCC)    squamous cell (T2N0MX), 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 pt was on Plavix  x 3 years then transitioned to ASA, no problems since   Non-small cell carcinoma of right lung,  stage 1 (HCC) 01/30/2016   Renal failure, acute    hx of 2/2 medication, resolved   Tobacco abuse    smoker .5 ppd x 55 yrs   UTI (lower urinary tract infection)     Past Surgical History:  Procedure Laterality Date   ABDOMINAL AORTIC ENDOVASCULAR FENESTRATED STENT GRAFT N/A 08/16/2022   Procedure: ABDOMINAL AORTIC ENDOVASCULAR FENESTRATED STENT GRAFT;  Surgeon: [name] ORN, MD;  Location: [facility] OR;  Service: Vascular;  Laterality: N/A;   ABDOMINAL AORTIC ENDOVASCULAR FENESTRATED STENT GRAFT N/A 12/02/2023   Procedure: ABDOMINAL AORTIC ENDOVASCULAR FENESTRATED STENT GRAFT WITH ULTRASOUND  GUIDED ACCESS;  Surgeon: [name] ORN, MD;  Location: [facility] INVASIVE CV LAB;  Service: Cardiovascular;  Laterality: N/A;   BIOPSY  11/12/2020   Procedure: BIOPSY;  Surgeon: Legrand Victory LITTIE DOUGLAS, MD;  Location: [facility] ENDOSCOPY;  Service: Gastroenterology;;   BIOPSY  07/25/2021   Procedure: BIOPSY;  Surgeon: Stacia Glendia BRAVO, MD;  Location: [facility] ENDOSCOPY;  Service: Gastroenterology;;   BIOPSY  12/24/2021   Procedure: BIOPSY;  Surgeon: Wilhelmenia Aloha Raddle., MD;  Location: [facility]COPY;  Service: Gastroenterology;;   BRONCHIAL BIOPSY  07/27/2021   Procedure: BRONCHIAL BIOPSIES;  Surgeon: Shelah Lamar RAMAN, MD;  Location: [facility] ENDOSCOPY;  Service: Cardiopulmonary;;   BRONCHIAL BRUSHINGS  07/27/2021   Procedure: BRONCHIAL BRUSHINGS;  Surgeon: Shelah Lamar RAMAN, MD;  Location: [facility] ENDOSCOPY;  Service: Cardiopulmonary;;   cataract surgery Bilateral 2018   removal of cataracts   COLONOSCOPY     hx polyps - Avram 02/2012   COLONOSCOPY WITH PROPOFOL  N/A 11/12/2020   Procedure: COLONOSCOPY WITH PROPOFOL ;  Surgeon: Legrand Victory LITTIE DOUGLAS, MD;  Location: [facility] ENDOSCOPY;  Service: Gastroenterology;  Laterality: N/A;   CORONARY ANGIOPLASTY WITH STENT PLACEMENT     ENDOVASCULAR STENT INSERTION Bilateral 08/16/2022   Procedure: ENDOVASCULAR STENT GRAFT INSERTION OF BILATERAL RENAL ARTERIES;  Surgeon: [name] ORN, MD;  Location: [facility] OR;  Service: Vascular;  Laterality: Bilateral;   ESOPHAGOGASTRODUODENOSCOPY (EGD) WITH PROPOFOL  N/A 11/12/2020   Procedure: ESOPHAGOGASTRODUODENOSCOPY (EGD) WITH PROPOFOL ;  Surgeon: Legrand Victory LITTIE DOUGLAS, MD;  Location: [facility] ENDOSCOPY;  Service: Gastroenterology;  Laterality: N/A;   ESOPHAGOGASTRODUODENOSCOPY (EGD) WITH PROPOFOL  N/A 03/06/2021   Procedure: ESOPHAGOGASTRODUODENOSCOPY (EGD) WITH PROPOFOL ;  Surgeon: Leigh Elspeth SQUIBB, MD;  Location: [facility] ENDOSCOPY;  Service: Gastroenterology;  Laterality: N/A;   ESOPHAGOGASTRODUODENOSCOPY (EGD) WITH PROPOFOL  N/A 07/25/2021   Procedure:  ESOPHAGOGASTRODUODENOSCOPY (EGD) WITH PROPOFOL ;  Surgeon: Stacia Glendia BRAVO, MD;  Location: [facility] ENDOSCOPY;  Service: Gastroenterology;  Laterality: N/A;   ESOPHAGOGASTRODUODENOSCOPY (EGD) WITH PROPOFOL  N/A 12/24/2021   Procedure: ESOPHAGOGASTRODUODENOSCOPY (EGD) WITH PROPOFOL ;  Surgeon: Wilhelmenia Aloha Raddle., MD;  Location: [facility] ENDOSCOPY;  Service: Gastroenterology;  Laterality: N/A;   flexible cystourethroscopy  05/24/06   HEMOSTASIS CLIP PLACEMENT  03/06/2021   Procedure: HEMOSTASIS CLIP PLACEMENT;  Surgeon: Leigh Elspeth SQUIBB, MD;  Location: [facility] ENDOSCOPY;  Service: Gastroenterology;;   HEMOSTASIS CLIP PLACEMENT  12/24/2021   Procedure: HEMOSTASIS CLIP PLACEMENT;  Surgeon: Wilhelmenia Aloha Raddle., MD;  Location: [facility] ENDOSCOPY;  Service: Gastroenterology;;   HOT HEMOSTASIS N/A 03/06/2021   Procedure: HOT HEMOSTASIS (ARGON PLASMA COAGULATION/BICAP);  Surgeon: Leigh Elspeth SQUIBB, MD;  Location: [facility];  Service: Gastroenterology;  Laterality: N/A;   INGUINAL HERNIA REPAIR  2005   INSERTION OF ILIAC STENT Bilateral 08/16/2022   Procedure: INSERTION OF ILIAC STENT RIGHT EXTERNAL ARTERY, LEFT COMMON ARTERY, AND RIGHT COMMON ARTERY;  Surgeon: [name] ORN, MD;  Location: [facility] OR;  Service: Vascular;  Laterality: Bilateral;  IR CATHETER TUBE CHANGE  07/08/2022   LUNG LOBECTOMY Right 11/07   RUL   MULTIPLE TOOTH EXTRACTIONS     POLYPECTOMY  11/12/2020   Procedure: POLYPECTOMY;  Surgeon: Legrand Victory LITTIE DOUGLAS, MD;  Location: [facility] ENDOSCOPY;  Service: Gastroenterology;;   POLYPECTOMY  12/24/2021   Procedure: POLYPECTOMY;  Surgeon: Wilhelmenia Aloha Raddle., MD;  Location: [facility];  Service: Gastroenterology;;   SUBMUCOSAL LIFTING INJECTION  12/24/2021   Procedure: SUBMUCOSAL LIFTING INJECTION;  Surgeon: Wilhelmenia Aloha Raddle., MD;  Location: [facility];  Service: Gastroenterology;;   TRANSURETHRAL RESECTION OF PROSTATE N/A 05/02/2021   Procedure: TRANSURETHRAL RESECTION OF THE PROSTATE (TURP);   Surgeon: Carolee Sherwood JONETTA DOUGLAS, MD;  Location: [facility] ORS;  Service: Urology;  Laterality: N/A;   ULTRASOUND GUIDANCE FOR VASCULAR ACCESS Bilateral 08/16/2022   Procedure: ULTRASOUND GUIDANCE FOR VASCULAR ACCESS;  Surgeon: [name] ORN, MD;  Location: [facility] OR;  Service: Vascular;  Laterality: Bilateral;   UPPER ESOPHAGEAL ENDOSCOPIC ULTRASOUND (EUS) N/A 12/24/2021   Procedure: UPPER ESOPHAGEAL ENDOSCOPIC ULTRASOUND (EUS);  Surgeon: Wilhelmenia Aloha Raddle., MD;  Location: [facility];  Service: Gastroenterology;  Laterality: N/A;   UPPER GASTROINTESTINAL ENDOSCOPY     VIDEO BRONCHOSCOPY  07/27/2021   Procedure: VIDEO BRONCHOSCOPY WITHOUT FLUORO;  Surgeon: Shelah Lamar RAMAN, MD;  Location: [facility] ENDOSCOPY;  Service: Cardiopulmonary;;     reports that he has been smoking cigarettes. He has a 41.3 pack-year smoking history. He has never used smokeless tobacco. He reports that he does not currently use alcohol. He reports that he does not use drugs.  Family History  Problem Relation Age of Onset   Diabetes Mother    Hypertension Sister    Heart disease Sister    Diabetes Sister    Arthritis Brother    Gout Brother    Colon cancer Neg Hx    Esophageal cancer Neg Hx    Rectal cancer Neg Hx    Stomach cancer Neg Hx    Colon polyps Neg Hx    Inflammatory bowel disease Neg Hx    Liver disease Neg Hx    Pancreatic cancer Neg Hx     Prior to Admission medications   Medication Sig Start Date End Date Taking? Authorizing Provider  albuterol  (VENTOLIN  HFA) 108 (90 Base) MCG/ACT inhaler Inhale 1-2 puffs into the lungs every 6 (six) hours as needed for wheezing or shortness of breath. 12/23/23   [name], DO  amLODipine  (NORVASC ) 10 MG tablet Take 1 tablet (10 mg total) by mouth daily. 12/09/23 12/08/24  [name], DO  aspirin  EC 81 MG tablet Take 1 tablet (81 mg total) by mouth daily. Swallow whole. 04/14/23   [name] ORN, MD  clopidogrel  (PLAVIX ) 75 MG tablet Take 1 tablet (75 mg total) by mouth  daily. 12/11/23   Bethanie Cough, PA-C  escitalopram  (LEXAPRO ) 5 MG tablet TAKE 1 TABLET (5 MG TOTAL) BY MOUTH DAILY. 12/09/23 06/06/24  [name], DO  HYDROcodone -acetaminophen  (NORCO) 7.5-325 MG tablet Take 1 tablet by mouth 2 (two) times daily as needed for moderate pain (pain score 4-6). Fill 30 days after last fill. 03/12/24   [name], DO  metoprolol  succinate (TOPROL -XL) 25 MG 24 hr tablet Take 1 tablet (25 mg total) by mouth daily. 03/26/24 03/26/25  [name], DO  mirtazapine  (REMERON ) 7.5 MG tablet Take 2 tablets (15 mg total) by mouth at bedtime. 03/24/24   [name], DO  Pancrelipase , Lip-Prot-Amyl, (ZENPEP ) 20000-63000 units CPEP Take 3  capsules by mouth 3 (three) times daily before meals. 04/08/24   [name], DO  pantoprazole  (PROTONIX ) 40 MG tablet TAKE 1 TABLET EVERY DAY FOR REFLUX 12/09/23   [name], DO  rosuvastatin  (CRESTOR ) 40 MG tablet Take 1 tablet (40 mg total) by mouth daily. 04/03/23   Jerrell Cleatus Ned, MD  sacubitril -valsartan  (ENTRESTO ) 97-103 MG Take 1 tablet by mouth 2 (two) times daily. 04/19/24   [name], DO  senna (SENOKOT) 8.6 MG TABS tablet Take 1 tablet (8.6 mg total) by mouth in the morning and at bedtime. 03/27/24   Smucker, Melvenia, MD  Tiotropium Bromide-Olodaterol (STIOLTO RESPIMAT ) 2.5-2.5 MCG/ACT AERS Inhale 2 puffs into the lungs daily. 02/27/24   Zaida Zola SAILOR, MD    Physical Exam: Vitals:   05/07/24 0233 05/07/24 0235  BP: 132/65   Pulse: 93   Resp: (!) 29   Temp: 99.1 F (37.3 C)   SpO2: 94%   Weight:  63.8 kg  Height:  6' (1.829 m)   Physical Exam Vitals reviewed.  Constitutional:      Appearance: He is normal weight.  HENT:     Head: Normocephalic.  Eyes:     Pupils: Pupils are equal, round, and reactive to light.  Cardiovascular:     Rate and Rhythm: Normal rate and regular rhythm.  Pulmonary:     Effort: Pulmonary effort is normal. Tachypnea present.     Breath sounds: Normal  breath sounds.  Abdominal:     Palpations: Abdomen is soft.  Musculoskeletal:        General: Normal range of motion.     Cervical back: Normal range of motion.  Skin:    General: Skin is warm.     Capillary Refill: Capillary refill takes less than 2 seconds.  Neurological:     General: No focal deficit present.     Mental Status: He is alert.  Psychiatric:        Mood and Affect: Mood normal.       Labs on Admission: I have personally reviewed the patients's labs and imaging studies.  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 -Given IV Lasix  - Wheezing in lungs  Plan: Scheduled DuoNebs Pulmicort  and Brovana  Continue azithromycin  5 days of prednisone  Continue Lasix  No need to repeat echo Continue home Entresto   #AKI on CKD 3b- patient Cr 1.5 at baseline, 2 on presetnation. Likely cardiorenal. Wwill monitor  # CAD-continue Plavix , aspirin   # Hypertension-continue amlodipine , metoprolol   # Pancreatic insufficiency-continue Creon   # Hyperlipidemia-continue statin   Admission status: Inpatient Telemetry  Certification: The appropriate patient status for this patient is INPATIENT. Inpatient status is judged to be reasonable and necessary in order to provide the required intensity of service to ensure the patient's safety. The patient's presenting symptoms, physical exam findings, and initial radiographic and laboratory data in the context of their chronic comorbidities is felt to place them at high risk for further clinical deterioration. Furthermore, it is not anticipated that the patient will be medically stable for discharge from the hospital within 2 midnights of admission.   * I certify that at the point of admission it is my clinical judgment that the patient will require  inpatient hospital care spanning beyond 2 midnights from the point of admission due to high intensity of service, high  risk for further deterioration and high frequency of surveillance required.DEWAINE Lamar Dess MD [facility] If 7PM-7AM, please contact night-coverage [email or web address]  05/07/2024, 4:29 AM

## 2024-05-07 NOTE — Consult Note (Addendum)
 NAME:  [name], MRN:  [id number], DOB:  December 10, 1945, LOS: 0 ADMISSION DATE:  05/07/2024, CONSULTATION DATE: 05/07/2024 REFERRING MD:  GORMAN Atlas MD, CHIEF COMPLAINT: COPD exacerbation, right upper lung collapse  History of Present Illness:   Patient, with COPD and a history of stage 1B non-small cell lung cancer, presents with breathing difficulties and COPD exacerbation.  PCCM consulted for CT showing right upper lung collapse  Dyspnea and respiratory symptoms - Breathing difficulties characterized as painful and sudden onset - Persistent shortness of breath - Active tobacco use  Copd exacerbation - Recent hospitalization for COPD exacerbation one to two months ago - Follows with Dr. Zaida in the pulmonary clinic  Pulmonary neoplasm history and post-surgical changes - History of stage 1B non-small cell lung cancer, treated with right upper lobectomy in 2007 - No evidence of cancer recurrence - Bronchoscopy in Feb 2023 for CT nodularity in right main stem bronchus showed protrusion of the airway medially into the right mainstem bronchus due to his surgical resection, stump site.  - Biopsies and brushings from bronchoscopy in 2023 were negative.  PET scan in 2023 was negative for any uptake.  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   Significant Hospital Events: Including procedures, antibiotic start and stop dates in addition to other pertinent events     Interim History / Subjective:    Objective    Blood pressure 129/70, pulse 92, temperature 98.6 F (37 C), temperature source Oral, resp. rate (!) 22, height 6' (1.829 m), weight 63.8 kg, SpO2 99%.        Intake/Output Summary (Last 24 hours) at 05/07/2024 1625 Last data filed at 05/07/2024 1309 Gross per 24 hour  Intake 100.94 ml  Output 300 ml  Net -199.06 ml   Filed Weights   05/07/24 0235  Weight: 63.8 kg    Examination: Awake, chronically ill-appearing man Lungs are clear to auscultation Heart rate regular rate and rhythm  CT chest with atelectasis of the right upper lobe, mild basilar interstitial opacities.  Minimal left effusion.  Resolved problem list   Assessment and Plan  COPD exacerbation with pneumonia and right upper lobe collapse due to post-surgical obstruction, mucus plugs COPD exacerbation with associated pneumonia and right upper lung collapse, likely due to post-surgical obstruction from previous right upper lobectomy. Possible mucus plug or endobronchial lesion causing obstruction.  Prior CT showed nodularity in the right main stem bronchus, and bronchoscopy in 2023 revealed a benign protrusion at the right mainstem bronchus surgical resection site, with negative biopsies and brushings. Current treatment includes antibiotics for pneumonia and COPD exacerbation. Oxygen therapy is required. Potential need for bronchoscopy if no improvement by next week. - Continue antibiotics for pneumonia - Treat COPD exacerbation with nebulizers and prednisone  - Initiated Mucomyst  and hypertonic saline medications - Chest physiotherapy to open right upper lobe - Continue oxygen therapy - Will consider bronchoscopy if no improvement by next week  Tobacco use  disorder Continued tobacco use despite history of lung cancer and COPD.  Advised smoking cessation  Signature   Takirah Binford MD [facility] See Amion for pager  If no response to pager , please call [phone] until 7pm After 7:00 pm call Elink  [phone] 05/07/2024, 4:55 PM

## 2024-05-07 NOTE — ED Provider Notes (Signed)
 [name]   CSN: [id number] Arrival date & time: 05/07/24  [id number]     Patient presents with: Shortness of Breath   [name] is a 78 y.o. male.   The history is provided by the patient.  Shortness of Breath Severity:  Severe Onset quality:  Gradual Duration:  1 day Timing:  Constant Progression:  Worsening Chronicity:  Recurrent Context: not weather changes   Relieved by:  Nothing Worsened by:  Nothing Ineffective treatments:  None tried Associated symptoms: cough and wheezing   Associated symptoms: no chest pain and no fever   Risk factors: no recent surgery   Patient with COPD and CHF who has not been using home nebs presents with hypoxia and SOB.  Has had a cough and wheezing.  No CP.     Past Medical History:  Diagnosis Date   AAA (abdominal aortic aneurysm)    followed by vasc surgery   Alcohol abuse    Barretts esophagus    bx distal esophagus 2011 neg    Bladder outlet obstruction 04/02/2018   CAD in native artery    a. s/p stent to prox RCA and mid Cx.   Cataract    Cholelithiasis    Chronic osteomyelitis involving ankle and foot, right (HCC) 07/13/2018   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   2006 - 3 adenomas largest 12 mm  2008 - no polyps 2013-  2 mm cecal polyp, adenoma, routine repeat colonoscopy 02/2017 approx 05/14/2017 18 polyps max 12 mm ALL adenomas recall 2019 [name] [name], MD, [facility]      Dieulafoy lesion of duodenum    Diverticulosis of colon    DJD (degenerative joint disease)    left hip   Full dentures    upper and lower   GERD (gastroesophageal reflux disease)    diet controlled    Gout    feet   Hiatal hernia    2 cm noted on upper endos 2011    Hx of adenomatous colonic polyps    Hyperlipidemia    Hypertension    Internal hemorrhoid    Lung cancer (HCC)    squamous cell (T2N0MX), 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 pt was on Plavix  x 3 years then transitioned to ASA, no problems since   Non-small cell carcinoma of right lung, stage 1 (HCC) 01/30/2016   Renal failure, acute    hx of 2/2 medication, resolved   Tobacco abuse    smoker .5 ppd x 55 yrs   UTI (lower urinary tract infection)        Prior to Admission medications   Medication Sig Start Date End Date Taking? Authorizing Provider  albuterol  (VENTOLIN  HFA) 108 (90 Base) MCG/ACT inhaler Inhale 1-2 puffs into the lungs every 6 (six) hours as needed for wheezing or shortness of breath. 12/23/23   [name], DO  amLODipine  (NORVASC ) 10 MG tablet Take 1 tablet (10 mg total) by mouth daily. 12/09/23 12/08/24  [name], DO  aspirin  EC 81 MG tablet Take 1 tablet (81 mg total) by mouth daily. Swallow whole. 04/14/23   [name] ORN, MD  clopidogrel  (PLAVIX ) 75 MG tablet Take 1 tablet (75 mg total) by mouth daily. 12/11/23   Bethanie Cough, PA-C  escitalopram  (LEXAPRO ) 5 MG tablet TAKE 1 TABLET (5 MG TOTAL) BY MOUTH DAILY. 12/09/23 06/06/24  [name], DO  HYDROcodone -acetaminophen  (NORCO) 7.5-325 MG tablet Take 1 tablet by mouth 2 (two) times daily as needed for moderate pain (pain score 4-6). Fill 30 days after last fill. 03/12/24   [name], DO  metoprolol  succinate (TOPROL -XL) 25 MG 24 hr tablet Take 1 tablet (25 mg total) by mouth daily. 03/26/24 03/26/25  [name], DO  mirtazapine  (REMERON ) 7.5 MG tablet Take 2 tablets (15 mg total) by mouth at bedtime. 03/24/24   [name], DO  Pancrelipase , Lip-Prot-Amyl, (ZENPEP ) 20000-63000 units CPEP Take 3 capsules by mouth 3 (three) times daily before meals. 04/08/24   [name], DO  pantoprazole  (PROTONIX ) 40 MG tablet TAKE 1 TABLET EVERY DAY FOR REFLUX 12/09/23   [name], DO  rosuvastatin  (CRESTOR ) 40 MG tablet Take 1 tablet (40 mg total) by mouth daily. 04/03/23   Jerrell Cleatus Ned, MD  sacubitril -valsartan  (ENTRESTO ) 97-103 MG  Take 1 tablet by mouth 2 (two) times daily. 04/19/24   [name], DO  senna (SENOKOT) 8.6 MG TABS tablet Take 1 tablet (8.6 mg total) by mouth in the morning and at bedtime. 03/27/24   Smucker, Melvenia, MD  Tiotropium Bromide-Olodaterol (STIOLTO RESPIMAT ) 2.5-2.5 MCG/ACT AERS Inhale 2 puffs into the lungs daily. 02/27/24   Hattar, Zola SAILOR, MD    Allergies: Candesartan cilexetil-hctz, Hydrochlorothiazide, Shellfish allergy, and Betadine [povidone iodine ]    Review of Systems  Constitutional:  Negative for fever.  Respiratory:  Positive for cough, shortness of breath and wheezing.   Cardiovascular:  Negative for chest pain.  All other systems reviewed and are negative.   Updated Vital Signs BP 132/65   Pulse 93   Temp 99.1 F (37.3 C)   Resp (!) 29   Ht 6' (1.829 m)   Wt 63.8 kg   SpO2 94%   BMI 19.07 kg/m   Physical Exam Vitals and nursing [name] reviewed.  Constitutional:      General: He is not in acute distress.    Appearance: Normal appearance. He is well-developed. He is not diaphoretic.  HENT:     Head: Normocephalic and atraumatic.     Nose: Nose normal.  Eyes:     Conjunctiva/sclera: Conjunctivae normal.     Pupils: Pupils are equal, round, and reactive to light.  Cardiovascular:     Rate and Rhythm: Normal rate and regular rhythm.  Pulmonary:     Effort: Pulmonary effort is normal.     Breath sounds: Wheezing, rhonchi and rales present.  Abdominal:     General: Bowel sounds are normal.     Palpations: Abdomen is soft.     Tenderness: There is no abdominal tenderness. There is no guarding or rebound.  Musculoskeletal:        General: Normal range of motion.     Cervical back: Normal range of motion and neck supple.  Skin:    General: Skin is warm and dry.     Capillary Refill: Capillary refill takes less than 2 seconds.  Neurological:     General: No focal deficit present.     Mental Status: He is alert and oriented to person, place, and time.      Deep Tendon Reflexes: Reflexes normal.  Psychiatric:        Mood and Affect: Mood normal.     (all labs ordered are listed, but only abnormal results are displayed) Results for orders placed or performed during the hospital encounter of 05/07/24  Basic metabolic panel  Collection Time: 05/07/24  2:37 AM  Result Value Ref Range   Sodium 138 135 - 145 mmol/L   Potassium 3.4 (L) 3.5 - 5.1 mmol/L   Chloride 103 98 - 111 mmol/L   CO2 24 22 - 32 mmol/L   Glucose, Bld 88 70 - 99 mg/dL   BUN 22 8 - 23 mg/dL   Creatinine, Ser 7.95 (H) 0.61 - 1.24 mg/dL   Calcium  8.7 (L) 8.9 - 10.3 mg/dL   GFR, Estimated 33 (L) >60 mL/min   Anion gap 11 5 - 15  CBC   Collection Time: 05/07/24  2:37 AM  Result Value Ref Range   WBC 14.5 (H) 4.0 - 10.5 K/uL   RBC 2.97 (L) 4.22 - 5.81 MIL/uL   Hemoglobin 9.2 (L) 13.0 - 17.0 g/dL   HCT 70.1 (L) 60.9 - 47.9 %   MCV 100.3 (H) 80.0 - 100.0 fL   MCH 31.0 26.0 - 34.0 pg   MCHC 30.9 30.0 - 36.0 g/dL   RDW 84.0 (H) 88.4 - 84.4 %   Platelets 78 (L) 150 - 400 K/uL   nRBC 0.0 0.0 - 0.2 %  Pro Brain natriuretic peptide   Collection Time: 05/07/24  2:37 AM  Result Value Ref Range   Pro Brain Natriuretic Peptide 18,466.0 (H) <300.0 pg/mL  Troponin T, High Sensitivity   Collection Time: 05/07/24  2:37 AM  Result Value Ref Range   Troponin T High Sensitivity 79 (H) 0 - 19 ng/L   ECHOCARDIOGRAM COMPLETE Result Date: 04/23/2024    ECHOCARDIOGRAM REPORT   Patient Name:   [name] Date of Exam: 04/23/2024 Medical Rec #:  [id number]       Height:       72.0 in Accession #:    [id number]      Weight:       135.3 lb Date of Birth:  22-Jul-1945       BSA:          1.805 m Patient Age:    78 years        BP:           144/60 mmHg Patient Gender: M               HR:           57 bpm. Exam Location:  [address] Procedure: 2D Echo, Cardiac Doppler and Color Doppler (Both Spectral and Color            Flow Doppler were utilized during procedure). Indications:    I50.22  Heart Failure with Reduced Ejection Fraction (HFrEF)  History:        Patient has prior history of Echocardiogram examinations, most                 recent 06/02/2023. CHF, CAD and Previous Myocardial Infarction,                 Abnormal ECG, Arrythmias:Atrial Fibrillation; Risk                 Factors:Hypertension, Dyslipidemia, Current Smoker and Family                 History of Coronary Artery Disease. HFrEF, AAA s/p Graft Repair,                 Chronic Renal Failure, History of Right Lung Cancer status post  Lobectomy (2017).  Sonographer:    Heather Hawks RDCS Referring Phys: MADISON L FOUNTAIN IMPRESSIONS  1. Left ventricular ejection fraction, by estimation, is 50 to 55%. The left ventricle has low normal function. The left ventricle has no regional wall motion abnormalities. There is mild left ventricular hypertrophy. Left ventricular diastolic parameters are indeterminate.  2. Right ventricular systolic function is normal. The right ventricular size is mildly enlarged. There is moderately elevated pulmonary artery systolic pressure. The estimated right ventricular systolic pressure is 54.6 mmHg.  3. Left atrial size was severely dilated.  4. Right atrial size was moderately dilated.  5. The mitral valve is normal in structure. Mild mitral valve regurgitation. No evidence of mitral stenosis.  6. Tricuspid valve regurgitation is moderate.  7. The aortic valve is normal in structure. Aortic valve regurgitation is mild. No aortic stenosis is present.  8. The inferior vena cava is normal in size with greater than 50% respiratory variability, suggesting right atrial pressure of 3 mmHg. FINDINGS  Left Ventricle: Left ventricular ejection fraction, by estimation, is 50 to 55%. The left ventricle has low normal function. The left ventricle has no regional wall motion abnormalities. The left ventricular internal cavity size was normal in size. There is mild left ventricular hypertrophy. Left  ventricular diastolic parameters are indeterminate. Right Ventricle: The right ventricular size is mildly enlarged. No increase in right ventricular wall thickness. Right ventricular systolic function is normal. There is moderately elevated pulmonary artery systolic pressure. The tricuspid regurgitant velocity is 3.59 m/s, and with an assumed right atrial pressure of 3 mmHg, the estimated right ventricular systolic pressure is 54.6 mmHg. Left Atrium: Left atrial size was severely dilated. Right Atrium: Right atrial size was moderately dilated. Pericardium: There is no evidence of pericardial effusion. Mitral Valve: The mitral valve is normal in structure. Mild mitral valve regurgitation. No evidence of mitral valve stenosis. Tricuspid Valve: The tricuspid valve is normal in structure. Tricuspid valve regurgitation is moderate . No evidence of tricuspid stenosis. Aortic Valve: The aortic valve is normal in structure. Aortic valve regurgitation is mild. No aortic stenosis is present. Pulmonic Valve: The pulmonic valve was normal in structure. Pulmonic valve regurgitation is not visualized. No evidence of pulmonic stenosis. Aorta: The aortic root is normal in size and structure. Venous: The inferior vena cava is normal in size with greater than 50% respiratory variability, suggesting right atrial pressure of 3 mmHg. IAS/Shunts: No atrial level shunt detected by color flow Doppler.  LEFT VENTRICLE PLAX 2D LVIDd:         4.60 cm   Diastology LVIDs:         3.75 cm   LV e' medial:    10.30 cm/s LV PW:         1.20 cm   LV E/e' medial:  7.9 LV IVS:        1.20 cm   LV e' lateral:   11.45 cm/s LVOT diam:     2.60 cm   LV E/e' lateral: 7.1 LV SV:         96 LV SV Index:   53 LVOT Area:     5.31 cm LV IVRT:       100 msec  RIGHT VENTRICLE RV Basal diam:  4.30 cm     PULMONARY VEINS RV Mid diam:    3.10 cm     A Reversal Velocity: 46.10 cm/s RV S prime:     12.85 cm/s  Diastolic Velocity:  58.70 cm/s TAPSE (  M-mode): 2.5 cm       S/D Velocity:        0.70 RVSP:           59.6 mmHg   Systolic Velocity:   39.90 cm/s LEFT ATRIUM              Index        RIGHT ATRIUM           Index LA diam:        4.10 cm  2.27 cm/m   RA Pressure: 8.00 mmHg LA Vol (A2C):   124.0 ml 68.72 ml/m  RA Area:     21.10 cm LA Vol (A4C):   96.9 ml  53.70 ml/m  RA Volume:   68.00 ml  37.68 ml/m LA Biplane Vol: 110.0 ml 60.96 ml/m  AORTIC VALVE LVOT Vmax:   90.48 cm/s LVOT Vmean:  55.850 cm/s LVOT VTI:    0.182 m  AORTA Ao Root diam: 3.60 cm Ao Asc diam:  3.40 cm MITRAL VALVE               TRICUSPID VALVE MV Area (PHT)  cm         TR Peak grad:   51.6 mmHg MV Decel Time: 184 msec    TR Vmax:        359.00 cm/s MR Peak grad: 132.2 mmHg   Estimated RAP:  8.00 mmHg MR Mean grad: 77.5 mmHg    RVSP:           59.6 mmHg MR Vmax:      575.00 cm/s MR Vmean:     405.0 cm/s   SHUNTS MV E velocity: 81.25 cm/s  Systemic VTI:  0.18 m MV A velocity: 35.50 cm/s  Systemic Diam: 2.60 cm MV E/A ratio:  2.29 Oneil Parchment MD Electronically signed by Oneil Parchment MD Signature Date/Time: 04/23/2024/3:05:55 PM    Final     EKG: EKG Interpretation Date/Time:  Friday May 07 2024 02:45:28 EST Ventricular Rate:  97 PR Interval:    QRS Duration:  125 QT Interval:  378 QTC Calculation: 481 R Axis:   -66  Text Interpretation: Atrial flutter Nonspecific IVCD with LAD Anteroseptal infarct, age indeterminate Confirmed by Lawarence Meek ([id number]) on 05/07/2024 3:00:47 AM  Radiology: No results found.   Procedures   Medications Ordered in the ED  albuterol  (PROVENTIL ) (2.5 MG/3ML) 0.083% nebulizer solution 5 mg (5 mg Nebulization Given 05/07/24 0308)  ipratropium (ATROVENT ) nebulizer solution 0.5 mg (0.5 mg Nebulization Given 05/07/24 0308)  furosemide  (LASIX ) injection 40 mg (40 mg Intravenous Given 05/07/24 0321)                                    Medical Decision Making Patient with COPD CHF presents with SOB and hypoxia to the low 80s.    Amount and/or  Complexity of Data Reviewed Independent Historian: EMS    Details: See above  External Data Reviewed: labs, radiology and notes.    Details: Previous admission reviewed baseline creatinine ~1.5 Labs: ordered.    Details: Normal sodium 138, potassium slight low 3.4, Elevated creatinine 2.04, elevated troponin 79,  elevated BNP 18,446 elevated white count 14.5, normal platelets 78.   Radiology: ordered and independent interpretation performed.    Details: CHF by me  ECG/medicine tests: ordered and independent interpretation performed. Decision-making details documented in ED Course.  Risk Prescription drug management. Decision regarding  hospitalization. Risk Details: Admit for CHF     Final diagnoses:  COPD exacerbation (HCC)  Acute pulmonary edema (HCC)   The patient appears reasonably stabilized for admission considering the current resources, flow, and capabilities available in the ED at this time, and I doubt any other [facility] requiring further screening and/or treatment in the ED prior to admission.  ED Discharge Orders     None          Kandis Henry, MD 05/07/24 [phone]

## 2024-05-07 NOTE — ED Notes (Signed)
 Consulted w pharmacy if pt can take digestive enzymes on empty stomach. Advised to hold because pt does not want to eat

## 2024-05-07 NOTE — ED Triage Notes (Signed)
 Pt BIBA from home, c/o SOB, diminished lungs sounds. 60 RA on arrival. hx of A fib. Lives home alone. A&O2. 12 lead unremarkable 20 G LFA 500 LR Duoneb  BP 100/52 126/74 HR 90-106 RR 20-30 O2 100 on 8L  CBG 83 Etio2 15

## 2024-05-07 NOTE — Progress Notes (Addendum)
 [facility] Progress Note  Patient: [name]     FMW:[id number]  DOA: 05/07/2024   PCP: [name], DO       Brief hospital course: 78 year old male with history of stage I non-small cell lung cancer, COPD, pancreatic insufficiency, chronic indwelling Foley, coronary artery disease, hypertension, abdominal aortic aneurysm status postrepair presented to the hospital for shortness of breath and wheezing. Of note, the patient was hospitalized from 03/26/2024 through 03/27/2024 for acute hypoxic respiratory failure felt to be secondary to COPD exacerbation.  He was discharged home on 3 L of oxygen.  In the ED, noted be tachypneic Chest x-ray showed diffuse airspace opacity in the right hemithorax with volume loss and elevated right hemidiaphragm. Creatinine noted to be 2.04, elevated from 1.40 previously. proBNP 18,466, troponin 79 WBC count 14.5. Respiratory virus panel, influenza, COVID and RSV negative. He was diagnosed with acute on chronic heart failure and a COPD exacerbation and treated with IV Lasix , DuoNebs, azithromycin  and steroids.   Subjective:  Continues to have a cough productive of yellow sputum.  Feels short of breath and very weak.  Assessment and Plan: Principal Problem:   Acute hypoxemic respiratory failure (HCC)-history of chronic systolic and diastolic heart failure - Of note, he was hospitalized about a month ago for COPD exacerbation and was discharged home on 3 L of oxygen - On my eval this morning, the patient was on 10 L of oxygen & attempting to wean him down caused his pulse ox to drop into the 80s - He did not appear to be in any respiratory distress but has persistent cough and severe weakness - Chest x-ray suggesting opacification of right hemithorax of unclear origin-Will obtain a CT scan without contrast (has AKI) - Will add ceftriaxone  to azithromycin  to treat for possible underlying pneumonia - Continue prednisone  -  Last echo from  11/7 revealed an EF of 50 to 55%, indeterminate LV diastolic parameters, moderately elevated pulmonary artery systolic pressure and mildly enlarged RV.  Both atria were dilated.  He does not appear to be volume overloaded on my exam-Will follow-up CT scan to see if he has pulmonary edema.  Hold Lasix , continue Entresto  and Toprol  - Changed from telemetry to stepdown status - Addendum: CT scan reveals right upper lobe collapse-have consulted pulmonary to evaluate for bronchoscopy  Active Problems:   Tobacco use disorder -Have counseled him-he states that he has been smoking at least a half a pack a day - Nicotine  patch added  AKI - Baseline creatinine is about 1.4-creatinine 2.04 when admitted - Will recheck tomorrow-hold further Lasix   Hypokalemia - Replace  A-flutter/fib-persistent - Chad Vasc 5 - Rate controlled - On  Aspirin  and Plavix  (for PAD) which are being continued  Chronic thrombocytopenia  Pancreatic insufficiency? - Continue Creon   Underweight Body mass index is 19.07 kg/m.     Code Status: Full Code Total time on patient care: 50 minutes DVT prophylaxis:  enoxaparin  (LOVENOX ) injection 30 mg Start: 05/07/24 1000 SCDs Start: 05/07/24 0413     Objective:   Vitals:   05/07/24 0933 05/07/24 0947 05/07/24 0952 05/07/24 1114  BP:   (!) 150/79 126/65  Pulse:   92   Resp:   (!) 22   Temp:   98 F (36.7 C)   TempSrc:      SpO2: (!) 88% (!) 87% (!) 86% 96%  Weight:      Height:       Filed Weights   05/07/24 0235  Weight: 63.8 kg   Exam: General exam: Appears comfortable  HEENT: oral mucosa moist Respiratory system: Tachypnea-very poor breath sounds Cardiovascular system: S1 & S2 heard  Gastrointestinal system: Abdomen soft, non-tender, nondistended. Normal bowel sounds   Extremities: No cyanosis, clubbing or edema Psychiatry:  Mood & affect appropriate.    CBC: Recent Labs  Lab 05/07/24 0237  WBC 14.5*  HGB 9.2*  HCT 29.8*  MCV 100.3*  PLT  78*   Basic Metabolic Panel: Recent Labs  Lab 05/07/24 0237  NA 138  K 3.4*  CL 103  CO2 24  GLUCOSE 88  BUN 22  CREATININE 2.04*  CALCIUM  8.7*     Scheduled Meds:  amLODipine   10 mg Oral Daily   arformoterol   15 mcg Nebulization BID   aspirin  EC  81 mg Oral Daily   azithromycin   500 mg Oral Daily   budesonide  (PULMICORT ) nebulizer solution  0.25 mg Nebulization BID   clopidogrel   75 mg Oral Daily   enoxaparin  (LOVENOX ) injection  30 mg Subcutaneous Q24H   escitalopram   5 mg Oral Daily   ipratropium-albuterol   3 mL Nebulization Q6H   lipase/protease/amylase  60,000 Units Oral TID AC   metoprolol  succinate  25 mg Oral Daily   mirtazapine   15 mg Oral QHS   pantoprazole   40 mg Oral Daily   predniSONE   50 mg Oral Q breakfast   rosuvastatin   40 mg Oral Daily   sacubitril -valsartan   1 tablet Oral BID    Imaging and lab data personally reviewed   Author: Romie Tay  05/07/2024 12:27 PM  To contact [facility]>   Check the care team in [facility] and look for the attending/consulting [facility] provider listed  Log into [email or web address] and use [place]'s universal password   Go to> [facility]  and find provider  If you still have difficulty reaching the provider, please page the [facility] (Director on Call) for the Hospitalists listed on amion

## 2024-05-08 DIAGNOSIS — J441 Chronic obstructive pulmonary disease with (acute) exacerbation: Secondary | ICD-10-CM | POA: Diagnosis not present

## 2024-05-08 LAB — CBC
HCT: 27 % — ABNORMAL LOW (ref 39.0–52.0)
Hemoglobin: 8.4 g/dL — ABNORMAL LOW (ref 13.0–17.0)
MCH: 30.5 pg (ref 26.0–34.0)
MCHC: 31.1 g/dL (ref 30.0–36.0)
MCV: 98.2 fL (ref 80.0–100.0)
Platelets: 71 K/uL — ABNORMAL LOW (ref 150–400)
RBC: 2.75 MIL/uL — ABNORMAL LOW (ref 4.22–5.81)
RDW: 15.6 % — ABNORMAL HIGH (ref 11.5–15.5)
WBC: 11.7 K/uL — ABNORMAL HIGH (ref 4.0–10.5)
nRBC: 0 % (ref 0.0–0.2)

## 2024-05-08 LAB — BASIC METABOLIC PANEL WITH GFR
Anion gap: 11 (ref 5–15)
BUN: 28 mg/dL — ABNORMAL HIGH (ref 8–23)
CO2: 25 mmol/L (ref 22–32)
Calcium: 8.5 mg/dL — ABNORMAL LOW (ref 8.9–10.3)
Chloride: 101 mmol/L (ref 98–111)
Creatinine, Ser: 2.04 mg/dL — ABNORMAL HIGH (ref 0.61–1.24)
GFR, Estimated: 33 mL/min — ABNORMAL LOW (ref >60)
Glucose, Bld: 144 mg/dL — ABNORMAL HIGH (ref 70–99)
Potassium: 3.6 mmol/L (ref 3.5–5.1)
Sodium: 137 mmol/L (ref 135–145)

## 2024-05-08 MED ORDER — PREDNISONE 20 MG PO TABS
40.0000 mg | ORAL_TABLET | Freq: Every day | ORAL | Status: DC
  Administered 2024-05-09 – 2024-05-14 (×6): 40 mg via ORAL
  Filled 2024-05-08 (×6): qty 2

## 2024-05-08 MED ORDER — ENSURE PLUS HIGH PROTEIN PO LIQD
237.0000 mL | Freq: Two times a day (BID) | ORAL | Status: DC
  Administered 2024-05-08 – 2024-05-13 (×11): 237 mL via ORAL

## 2024-05-08 NOTE — Evaluation (Signed)
 Clinical/Bedside Swallow Evaluation Patient Details  Name: [name] MRN: [id number] Date of Birth: 22-Dec-1945  Today's Date: 05/08/2024 Time: SLP Start Time (ACUTE ONLY): 1415 SLP Stop Time (ACUTE ONLY): 1427 SLP Time Calculation (min) (ACUTE ONLY): 12 min  Past Medical History:  Past Medical History:  Diagnosis Date   AAA (abdominal aortic aneurysm)    followed by vasc surgery   Alcohol abuse    Barretts esophagus    bx distal esophagus 2011 neg    Bladder outlet obstruction 04/02/2018   CAD in native artery    a. s/p stent to prox RCA and mid Cx.   Cataract    Cholelithiasis    Chronic osteomyelitis involving ankle and foot, right (HCC) 07/13/2018   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   2006 - 3 adenomas largest 12 mm  2008 - no polyps 2013-  2 mm cecal polyp, adenoma, routine repeat colonoscopy 02/2017 approx 05/14/2017 18 polyps max 12 mm ALL adenomas recall 2019 [place] [name] CHARLENA Avram, MD, [facility]      Dieulafoy lesion of duodenum    Diverticulosis of colon    DJD (degenerative joint disease)    left hip   Full dentures    upper and lower   GERD (gastroesophageal reflux disease)    diet controlled    Gout    feet   Hiatal hernia    2 cm noted on upper endos 2011    Hx of adenomatous colonic polyps    Hyperlipidemia    Hypertension    Internal hemorrhoid    Lung cancer (HCC)    squamous cell ([id number]), 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 pt was on Plavix  x 3 years then transitioned to ASA, no problems since   Non-small cell carcinoma of right lung, stage 1 (HCC) 01/30/2016   Renal failure, acute    hx of 2/2 medication, resolved   Tobacco abuse    smoker .5 ppd x 55 yrs   UTI (lower urinary tract infection)    Past Surgical History:  Past Surgical History:  Procedure Laterality Date   ABDOMINAL AORTIC ENDOVASCULAR FENESTRATED STENT GRAFT N/A 08/16/2022   Procedure: ABDOMINAL AORTIC ENDOVASCULAR  FENESTRATED STENT GRAFT;  Surgeon: [name] ORN, MD;  Location: [facility] OR;  Service: Vascular;  Laterality: N/A;   ABDOMINAL AORTIC ENDOVASCULAR FENESTRATED STENT GRAFT N/A 12/02/2023   Procedure: ABDOMINAL AORTIC ENDOVASCULAR FENESTRATED STENT GRAFT WITH ULTRASOUND GUIDED ACCESS;  Surgeon: [name] ORN, MD;  Location: [facility] INVASIVE CV LAB;  Service: Cardiovascular;  Laterality: N/A;   BIOPSY  11/12/2020   Procedure: BIOPSY;  Surgeon: Legrand Victory LITTIE DOUGLAS, MD;  Location: [facility] ENDOSCOPY;  Service: Gastroenterology;;   BIOPSY  07/25/2021   Procedure: BIOPSY;  Surgeon: Stacia Glendia BRAVO, MD;  Location: [facility] ENDOSCOPY;  Service: Gastroenterology;;   BIOPSY  12/24/2021   Procedure: BIOPSY;  Surgeon: Wilhelmenia Aloha Raddle., MD;  Location: [facility] ENDOSCOPY;  Service: Gastroenterology;;   BRONCHIAL BIOPSY  07/27/2021   Procedure: BRONCHIAL BIOPSIES;  Surgeon: Shelah Lamar RAMAN, MD;  Location: [facility] ENDOSCOPY;  Service: Cardiopulmonary;;   BRONCHIAL BRUSHINGS  07/27/2021   Procedure: BRONCHIAL BRUSHINGS;  Surgeon: Shelah Lamar RAMAN, MD;  Location: [facility] ENDOSCOPY;  Service: Cardiopulmonary;;   cataract surgery Bilateral 2018   removal of cataracts   COLONOSCOPY     hx polyps - [name] 02/2012   COLONOSCOPY WITH PROPOFOL  N/A 11/12/2020   Procedure: COLONOSCOPY WITH PROPOFOL ;  Surgeon:  Legrand Victory LITTIE DOUGLAS, MD;  Location: [facility] ENDOSCOPY;  Service: Gastroenterology;  Laterality: N/A;   CORONARY ANGIOPLASTY WITH STENT PLACEMENT     ENDOVASCULAR STENT INSERTION Bilateral 08/16/2022   Procedure: ENDOVASCULAR STENT GRAFT INSERTION OF BILATERAL RENAL ARTERIES;  Surgeon: [name] ORN, MD;  Location: [facility] OR;  Service: Vascular;  Laterality: Bilateral;   ESOPHAGOGASTRODUODENOSCOPY (EGD) WITH PROPOFOL  N/A 11/12/2020   Procedure: ESOPHAGOGASTRODUODENOSCOPY (EGD) WITH PROPOFOL ;  Surgeon: Legrand Victory LITTIE DOUGLAS, MD;  Location: [facility] ENDOSCOPY;  Service: Gastroenterology;  Laterality: N/A;   ESOPHAGOGASTRODUODENOSCOPY (EGD) WITH PROPOFOL  N/A 03/06/2021    Procedure: ESOPHAGOGASTRODUODENOSCOPY (EGD) WITH PROPOFOL ;  Surgeon: Leigh Elspeth SQUIBB, MD;  Location: [facility] ENDOSCOPY;  Service: Gastroenterology;  Laterality: N/A;   ESOPHAGOGASTRODUODENOSCOPY (EGD) WITH PROPOFOL  N/A 07/25/2021   Procedure: ESOPHAGOGASTRODUODENOSCOPY (EGD) WITH PROPOFOL ;  Surgeon: Stacia Glendia BRAVO, MD;  Location: [facility] ENDOSCOPY;  Service: Gastroenterology;  Laterality: N/A;   ESOPHAGOGASTRODUODENOSCOPY (EGD) WITH PROPOFOL  N/A 12/24/2021   Procedure: ESOPHAGOGASTRODUODENOSCOPY (EGD) WITH PROPOFOL ;  Surgeon: Wilhelmenia Aloha Raddle., MD;  Location: [facility] ENDOSCOPY;  Service: Gastroenterology;  Laterality: N/A;   flexible cystourethroscopy  05/24/06   HEMOSTASIS CLIP PLACEMENT  03/06/2021   Procedure: HEMOSTASIS CLIP PLACEMENT;  Surgeon: Leigh Elspeth SQUIBB, MD;  Location: [facility] ENDOSCOPY;  Service: Gastroenterology;;   HEMOSTASIS CLIP PLACEMENT  12/24/2021   Procedure: HEMOSTASIS CLIP PLACEMENT;  Surgeon: Wilhelmenia Aloha Raddle., MD;  Location: [facility] ENDOSCOPY;  Service: Gastroenterology;;   HOT HEMOSTASIS N/A 03/06/2021   Procedure: HOT HEMOSTASIS (ARGON PLASMA COAGULATION/BICAP);  Surgeon: Leigh Elspeth SQUIBB, MD;  Location: [facility] ENDOSCOPY;  Service: Gastroenterology;  Laterality: N/A;   INGUINAL HERNIA REPAIR  2005   INSERTION OF ILIAC STENT Bilateral 08/16/2022   Procedure: INSERTION OF ILIAC STENT RIGHT EXTERNAL ARTERY, LEFT COMMON ARTERY, AND RIGHT COMMON ARTERY;  Surgeon: [name] ORN, MD;  Location: [facility] OR;  Service: Vascular;  Laterality: Bilateral;   IR CATHETER TUBE CHANGE  07/08/2022   LUNG LOBECTOMY Right 11/07   RUL   MULTIPLE TOOTH EXTRACTIONS     POLYPECTOMY  11/12/2020   Procedure: POLYPECTOMY;  Surgeon: Legrand Victory LITTIE DOUGLAS, MD;  Location: [facility] ENDOSCOPY;  Service: Gastroenterology;;   POLYPECTOMY  12/24/2021   Procedure: POLYPECTOMY;  Surgeon: Wilhelmenia Aloha Raddle., MD;  Location: [facility] ENDOSCOPY;  Service: Gastroenterology;;   SUBMUCOSAL LIFTING INJECTION  12/24/2021    Procedure: SUBMUCOSAL LIFTING INJECTION;  Surgeon: Wilhelmenia Aloha Raddle., MD;  Location: [facility] ENDOSCOPY;  Service: Gastroenterology;;   TRANSURETHRAL RESECTION OF PROSTATE N/A 05/02/2021   Procedure: TRANSURETHRAL RESECTION OF THE PROSTATE (TURP);  Surgeon: Carolee Sherwood JONETTA DOUGLAS, MD;  Location: [facility] ORS;  Service: Urology;  Laterality: N/A;   ULTRASOUND GUIDANCE FOR VASCULAR ACCESS Bilateral 08/16/2022   Procedure: ULTRASOUND GUIDANCE FOR VASCULAR ACCESS;  Surgeon: [name] ORN, MD;  Location: [facility] OR;  Service: Vascular;  Laterality: Bilateral;   UPPER ESOPHAGEAL ENDOSCOPIC ULTRASOUND (EUS) N/A 12/24/2021   Procedure: UPPER ESOPHAGEAL ENDOSCOPIC ULTRASOUND (EUS);  Surgeon: Wilhelmenia Aloha Raddle., MD;  Location: [facility]OSCOPY;  Service: Gastroenterology;  Laterality: N/A;   UPPER GASTROINTESTINAL ENDOSCOPY     VIDEO BRONCHOSCOPY  07/27/2021   Procedure: VIDEO BRONCHOSCOPY WITHOUT FLUORO;  Surgeon: Shelah Lamar RAMAN, MD;  Location: [facility] ENDOSCOPY;  Service: Cardiopulmonary;;   HPI:  Pt is a 78 y.o. male who presented to ED with SOB and COPD exacerbation. Pt was admitted in October.  During this hospitalization he was treated for COPD and discharged with outpatient follow-up. RN placed BSE order due to pt consistently coughing with liquids. CXR (11/21)  revealed, Progressive opacification of the right hemithorax related to diffuse airspace infiltrate, likely asymmetric pulmonary edema, with associated small pleural effusion. Previous BSE (07/2021) revealed no indications for aspiration. Regular diet/thin liquids recommended. PMH: abdominal aortic aneurysm, alcohol usage, CAD, GERD, hypertension, hyperlipidemia, lung cancer.    Assessment / Plan / Recommendation  Clinical Impression  Pt presents with functional oropharyngeal swallow at bedside. Oral motor exam WFL, pt is edentulous without dentures present. 3oz water swallow challenge completed without signs of aspiration to follow. x1 belch followed by a brief  cough exhibited after single sips of thin with belching occuring intermittently throughout eval. Suspect this is related to h/o GERD.       PLAN: Given clinical presentation, but also COPD exacerbation and RN reports of coughing incidence with liquids, recommend continue regular diet/thin liquids with adherence to swallow/aspiration precautions. SLP would like to f/u briefly to ensure tolerance of liquids and education in swallow precautions as well as esophageal precautions.  SLP Visit Diagnosis: Dysphagia, unspecified (R13.10)    Aspiration Risk  Mild aspiration risk    Diet Recommendation Regular;Thin liquid    Liquid Administration via: Cup;Straw Medication Administration: Whole meds with liquid Supervision: Patient able to self feed Compensations: Minimize environmental distractions;Slow rate;Small sips/bites Postural Changes: Seated upright at 90 degrees;Remain upright for at least 30 minutes after po intake    Other  Recommendations Oral Care Recommendations: Oral care BID     Assistance Recommended at Discharge    Functional Status Assessment Patient has had a recent decline in their functional status and demonstrates the ability to make significant improvements in function in a reasonable and predictable amount of time.  Frequency and Duration min 1 x/week  1 week       Prognosis Prognosis for improved oropharyngeal function: Good      Swallow Study   General Date of Onset: 05/08/24 HPI: Pt is a 78 y.o. male who presented to ED with SOB and COPD exacerbation. Pt was admitted in October.  During this hospitalization he was treated for COPD and discharged with outpatient follow-up. RN placed BSE order due to pt consistently coughing with liquids. CXR (11/21) revealed, Progressive opacification of the right hemithorax related to diffuse airspace infiltrate, likely asymmetric pulmonary edema, with associated small pleural effusion. Previous BSE (07/2021) revealed no indications  for aspiration. Regular diet/thin liquids recommended. PMH: abdominal aortic aneurysm, alcohol usage, CAD, GERD, hypertension, hyperlipidemia, lung cancer. Type of Study: Bedside Swallow Evaluation Previous Swallow Assessment: see HPI Diet Prior to this Study: Regular;Thin liquids (Level 0) Temperature Spikes Noted: No Respiratory Status: Nasal cannula History of Recent Intubation: No Behavior/Cognition: Alert;Cooperative;Pleasant mood Oral Cavity Assessment: Within Functional Limits Oral Care Completed by SLP: No Oral Cavity - Dentition: Edentulous;Dentures, not available Vision: Functional for self-feeding Self-Feeding Abilities: Able to feed self Patient Positioning: Upright in bed;Postural control adequate for testing Baseline Vocal Quality: Normal Volitional Cough: Strong Volitional Swallow: Able to elicit    Oral/Motor/Sensory Function Overall Oral Motor/Sensory Function: Within functional limits   Ice Chips Ice chips: Not tested   Thin Liquid Thin Liquid: Within functional limits Presentation: Cup;Straw;Self Fed    Nectar Thick Nectar Thick Liquid: Not tested   Honey Thick Honey Thick Liquid: Not tested   Puree Puree: Within functional limits Presentation: Self Fed;Spoon   Solid     Solid: Within functional limits Presentation: Self Fed       Wilder Kin, MA, CCC-SLP Acute Rehabilitation Services Office Number: [phone]  Wilder KANDICE Kin 05/08/2024,2:41 PM

## 2024-05-08 NOTE — Plan of Care (Signed)

## 2024-05-08 NOTE — Progress Notes (Signed)
 [facility] Progress Note  Patient: [name]     FMW:[id number]  DOA: 05/07/2024   PCP: [name], DO       Brief hospital course: 78 year old male with history of stage I non-small cell lung cancer, COPD, pancreatic insufficiency, chronic indwelling Foley, coronary artery disease, hypertension, abdominal aortic aneurysm status postrepair presented to the hospital for shortness of breath and wheezing. Of note, the patient was hospitalized from 03/26/2024 through 03/27/2024 for acute hypoxic respiratory failure felt to be secondary to COPD exacerbation.  He was discharged home on 3 L of oxygen.  In the ED, noted be tachypneic Chest x-ray showed diffuse airspace opacity in the right hemithorax with volume loss and elevated right hemidiaphragm. Creatinine noted to be 2.04, elevated from 1.40 previously. proBNP 18,466, troponin 79 WBC count 14.5. Respiratory virus panel, influenza, COVID and RSV negative. He was diagnosed with acute on chronic heart failure and a COPD exacerbation and treated with IV Lasix , DuoNebs, azithromycin  and steroids.   Subjective:  Breathing much better than when he first arrived.  Cough is improving.  Assessment and Plan: Principal Problem:   Acute hypoxemic respiratory failure (HCC) - Of note, he was hospitalized about a month ago for COPD exacerbation and was discharged home on 3 L of oxygen - 11/21: The patient was on 10 L of oxygen & attempting to wean him down caused his pulse ox to drop into the 80s - He did not appear to be in any respiratory distress but has persistent cough and severe weakness - Chest x-ray suggesting opacification of right hemithorax of unclear origin-CT scan reveals right upper lobe collapse-have consulted pulmonary to evaluate for bronchoscopy - Added ceftriaxone  to azithromycin  to treat for possible underlying pneumonia - Continue prednisone  - Hypertonic nebs added - 11/22: Appears to have improved and has  been weaned down to 3 L-still tachypneic    Active Problems: History of chronic systolic and diastolic heart failure -  Last echo from 11/7 revealed an EF of 50 to 55%, indeterminate LV diastolic parameters, moderately elevated pulmonary artery systolic pressure and mildly enlarged RV.  Both atria were dilated.  He does not appear to be volume overloaded on my exam -  Hold Lasix , continue Entresto  and Toprol     Tobacco use disorder -Have counseled him-he states that he has been smoking at least a half a pack a day - Nicotine  patch added  AKI - Baseline creatinine is about 1.4-creatinine 2.04 when admitted -Holding Lasix   Hypokalemia - Replaced  A-flutter/fib-persistent - Chad Vasc 5 - Rate controlled - On Aspirin  and Plavix  (for PAD) which are being continued  Chronic thrombocytopenia  Pancreatic insufficiency? - Continue Creon   Underweight Body mass index is 19.07 kg/m.     Code Status: Full Code Total time on patient care: 30 minutes DVT prophylaxis:  enoxaparin  (LOVENOX ) injection 30 mg Start: 05/07/24 1000 SCDs Start: 05/07/24 0413     Objective:   Vitals:   05/08/24 1200 05/08/24 1254 05/08/24 1300 05/08/24 1312  BP: 132/78  132/72   Pulse:   90   Resp: (!) 35  (!) 21   Temp:  98 F (36.7 C)    TempSrc:  Oral    SpO2: 96%  93% 95%  Weight:      Height:       Filed Weights   05/07/24 0235  Weight: 63.8 kg   Exam: General exam: Appears comfortable  HEENT: oral mucosa moist Respiratory system: Tachypnea-very poor breath sounds Cardiovascular  system: S1 & S2 heard  Gastrointestinal system: Abdomen soft, non-tender, nondistended. Normal bowel sounds   Extremities: No cyanosis, clubbing or edema Psychiatry:  Mood & affect appropriate.    CBC: Recent Labs  Lab 05/07/24 0237 05/08/24 0216  WBC 14.5* 11.7*  HGB 9.2* 8.4*  HCT 29.8* 27.0*  MCV 100.3* 98.2  PLT 78* 71*   Basic Metabolic Panel: Recent Labs  Lab 05/07/24 0237 05/07/24 1336  05/07/24 1537 05/08/24 0216  NA 138 134* 136 137  K 3.4* 3.6 3.6 3.6  CL 103 100 100 101  CO2 24 24 24 25   GLUCOSE 88 124* 128* 144*  BUN 22 23 24* 28*  CREATININE 2.04* 1.98* 2.04* 2.04*  CALCIUM  8.7* 8.6* 8.7* 8.5*     Scheduled Meds:  acetylcysteine   2 mL Nebulization BID   arformoterol   15 mcg Nebulization BID   aspirin  EC  81 mg Oral Daily   azithromycin   500 mg Oral Daily   budesonide  (PULMICORT ) nebulizer solution  0.25 mg Nebulization BID   Chlorhexidine  Gluconate Cloth  6 each Topical Daily   clopidogrel   75 mg Oral Daily   enoxaparin  (LOVENOX ) injection  30 mg Subcutaneous Q24H   escitalopram   5 mg Oral Daily   feeding supplement  237 mL Oral BID BM   ipratropium-albuterol   3 mL Nebulization Q6H   lipase/protease/amylase  60,000 Units Oral TID AC   metoprolol  succinate  25 mg Oral Daily   mirtazapine   15 mg Oral QHS   nicotine   14 mg Transdermal Daily   pantoprazole   40 mg Oral Daily   predniSONE   50 mg Oral Q breakfast   rosuvastatin   40 mg Oral Daily   sacubitril -valsartan   1 tablet Oral BID   sodium chloride  HYPERTONIC  4 mL Nebulization BID    Imaging and lab data personally reviewed   Author: [employer]  05/08/2024 2:17 PM  To contact [facility]>   Check the care team in [facility] and look for the attending/consulting [facility] provider listed  Log into [email or web address] and use [place]'s universal password   Go to> [facility]  and find provider  If you still have difficulty reaching the provider, please page the [facility] (Director on Call) for the Hospitalists listed on amion

## 2024-05-09 DIAGNOSIS — Z85118 Personal history of other malignant neoplasm of bronchus and lung: Secondary | ICD-10-CM | POA: Diagnosis not present

## 2024-05-09 DIAGNOSIS — J9601 Acute respiratory failure with hypoxia: Secondary | ICD-10-CM | POA: Diagnosis not present

## 2024-05-09 DIAGNOSIS — J441 Chronic obstructive pulmonary disease with (acute) exacerbation: Secondary | ICD-10-CM | POA: Diagnosis not present

## 2024-05-09 NOTE — Consult Note (Signed)
 NAME:  [name], MRN:  [id number], DOB:  April 12, 1946, LOS: 2 ADMISSION DATE:  05/07/2024, CONSULTATION DATE: 05/07/2024 REFERRING MD:  GORMAN Atlas MD, CHIEF COMPLAINT: COPD exacerbation, right upper lung collapse  History of Present Illness:   Patient, with COPD and a history of stage 1B non-small cell lung cancer, presents with breathing difficulties and COPD exacerbation.  PCCM consulted for CT showing right upper lung collapse  Dyspnea and respiratory symptoms - Breathing difficulties characterized as painful and sudden onset - Persistent shortness of breath - Active tobacco use  Copd exacerbation - Recent hospitalization for COPD exacerbation one to two months ago - Follows with Dr. Zaida in the pulmonary clinic  Pulmonary neoplasm history and post-surgical changes - History of stage 1B non-small cell lung cancer, treated with right upper lobectomy in 2007 - No evidence of cancer recurrence - Bronchoscopy in Feb 2023 for CT nodularity in right main stem bronchus showed protrusion of the airway medially into the right mainstem bronchus due to his surgical resection, stump site.  - Biopsies and brushings from bronchoscopy in 2023 were negative.  PET scan in 2023 was negative for any uptake.  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   Significant Hospital Events: Including procedures, antibiotic start and stop dates in addition to other pertinent events   11/21 --> admitted to [facility] and pulmonology consulted, recommended aggressive mucociliary clearance for suspected mucous plugging.  Interim History / Subjective:  Patient notes feeling better overall. Dyspnea seems to be back to baseline.  Objective    Blood pressure (!) 110/57, pulse 80, temperature 98.5 F (36.9 C), temperature source Oral, resp. rate (!) 22, height 6' (1.829 m), weight 63.8 kg, SpO2 92%.    FiO2 (%):  [44 %] 44 %   Intake/Output Summary (Last 24 hours) at 05/09/2024 1019 Last data filed at 05/09/2024 0943 Gross per 24 hour  Intake 527 ml  Output 800 ml  Net -273 ml   Filed Weights   05/07/24 0235  Weight: 63.8 kg    Examination: General: NAD, alert, WD, WN Eyes: PERRL, no scleral icterus ENMT: oropharynx clear, good dentition, no oral lesions, mallampati score I Skin: warm, intact, no rashes Neck: JVD flat, ROM and lymph node assessment normal CV: RRR, no MRG, nl S1 and S2, no peripheral edema Resp: clear to auscultation anteriorly, diminished in right lower lung base Abdom: Normoactive bowel sounds, soft, nontender, nondistended, no hepatosplenomegaly Neuro: Awake alert oriented to person place time and situation  Resolved problem list   Assessment and Plan  COPD exacerbation with pneumonia and right upper lobe collapse due to post-surgical obstruction, mucus plugs COPD exacerbation with associated pneumonia and right upper lung collapse, likely due to post-surgical obstruction from previous right upper lobectomy. Possible mucus plug or endobronchial lesion causing obstruction.  Prior CT showed nodularity in the right main stem bronchus, and bronchoscopy in 2023 revealed a benign protrusion at the right mainstem bronchus surgical resection  site, with negative  biopsies and brushings. Current treatment includes antibiotics for pneumonia and COPD exacerbation. Oxygen therapy is required. Potential need for bronchoscopy if no improvement by next week. - Continue antibiotics for pneumonia - Treat COPD exacerbation with nebulizers and prednisone  - Continue Mucomyst  and hypertonic saline medications - Chest physiotherapy to open right upper lobe - Continue oxygen therapy - Repeat CT chest in the next 24 - 48 hours, if RUL is not open, will proceed with bronchoscopy  Tobacco use disorder Continued tobacco use despite history of lung cancer and COPD.  Advised smoking cessation  Signature   Thank you for this interesting consult. I have spent 35 minutes evaluating patient, reviewing chart, and discussing plan of care with patient, family, and primary medical team. If you have any questions or concerns please reach out to me via secure chat.  Paula Southerly, MD [facility]

## 2024-05-09 NOTE — Progress Notes (Addendum)
 [facility] Progress Note  Patient: [name]     FMW:[id number]  DOA: 05/07/2024   PCP: [name], DO       Brief hospital course: 78 year old male with history of stage I non-small cell lung cancer, COPD, pancreatic insufficiency, chronic suprapubic catheter, coronary artery disease, hypertension, abdominal aortic aneurysm status post repair presented to the hospital for shortness of breath and wheezing. Of note, the patient was hospitalized from 03/26/2024 through 03/27/2024 for acute hypoxic respiratory failure felt to be secondary to COPD exacerbation.  He was discharged home on 3 L of oxygen.  In the ED, noted be tachypneic Chest x-ray showed diffuse airspace opacity in the right hemithorax with volume loss and elevated right hemidiaphragm. Creatinine noted to be 2.04, elevated from 1.40 previously. proBNP 18,466, troponin 79 WBC count 14.5. Respiratory virus panel, influenza, COVID and RSV negative. He was diagnosed with acute on chronic heart failure and a COPD exacerbation and treated with IV Lasix , DuoNebs, azithromycin  and steroids.   Subjective:  Breathing continues to improve.  Not much cough.  Assessment and Plan: Principal Problem:   Acute hypoxemic respiratory failure (HCC) - Of note, he was hospitalized about a month ago for COPD exacerbation and was discharged home on 3 L of oxygen - 11/21: The patient was on 10 L of oxygen & attempting to wean him down caused his pulse ox to drop into the 80s - He did not appear to be in any respiratory distress but has persistent cough and severe weakness - Chest x-ray suggesting opacification of right hemithorax of unclear origin-CT scan reveals right upper lobe collapse-have consulted pulmonary to evaluate for bronchoscopy - Added ceftriaxone  to azithromycin  to treat for possible underlying pneumonia - Continue prednisone  - Hypertonic nebs and Mucomyst  added - Appreciate pulmonary assistance - He was back  on 6 L of oxygen today-continues to have tachypnea    Active Problems: History of chronic systolic and diastolic heart failure -  Last echo from 11/7 revealed an EF of 50 to 55%, indeterminate LV diastolic parameters, moderately elevated pulmonary artery systolic pressure and mildly enlarged RV.  Both atria were dilated.  He does not appear to be volume overloaded on my exam -  Hold Lasix , continue Entresto  and Toprol     Tobacco use disorder -Have counseled him-he states that he has been smoking at least a half a pack a day - Nicotine  patch added  AKI - Baseline creatinine is about 1.4-creatinine 2.04 when admitted -Holding Lasix   Hypokalemia - Replaced  A-flutter/fib-persistent - Chad Vasc 5 - Rate controlled - On Aspirin  and Plavix  (for PAD) which are being continued  Chronic thrombocytopenia  Pancreatic insufficiency? - Continue Creon   Chronic suprapubic catheter  Underweight Body mass index is 19.07 kg/m.     Code Status: Full Code Total time on patient care: 30 minutes DVT prophylaxis:  enoxaparin  (LOVENOX ) injection 30 mg Start: 05/07/24 1000 SCDs Start: 05/07/24 0413     Objective:   Vitals:   05/09/24 1000 05/09/24 1100 05/09/24 1200 05/09/24 1219  BP: (!) 110/57 (!) 118/55 (!) 122/102   Pulse: 80  76 (!) 39  Resp: (!) 22 (!) 21 17 (!) 28  Temp:      TempSrc:      SpO2: 92%  (!) 85% 96%  Weight:      Height:       Filed Weights   05/07/24 0235  Weight: 63.8 kg   Exam: General exam: Appears comfortable  HEENT: oral mucosa  moist Respiratory system: Tachypnea-mild rhonchi Cardiovascular system: S1 & S2 heard  Gastrointestinal system: Abdomen soft, non-tender, nondistended. Normal bowel sounds-suprapubic catheter present Extremities: No cyanosis, clubbing or edema Psychiatry:  Mood & affect appropriate.    CBC: Recent Labs  Lab 05/07/24 0237 05/08/24 0216  WBC 14.5* 11.7*  HGB 9.2* 8.4*  HCT 29.8* 27.0*  MCV 100.3* 98.2  PLT 78* 71*    Basic Metabolic Panel: Recent Labs  Lab 05/07/24 0237 05/07/24 1336 05/07/24 1537 05/08/24 0216  NA 138 134* 136 137  K 3.4* 3.6 3.6 3.6  CL 103 100 100 101  CO2 24 24 24 25   GLUCOSE 88 124* 128* 144*  BUN 22 23 24* 28*  CREATININE 2.04* 1.98* 2.04* 2.04*  CALCIUM  8.7* 8.6* 8.7* 8.5*     Scheduled Meds:  acetylcysteine   2 mL Nebulization BID   arformoterol   15 mcg Nebulization BID   aspirin  EC  81 mg Oral Daily   azithromycin   500 mg Oral Daily   budesonide  (PULMICORT ) nebulizer solution  0.25 mg Nebulization BID   Chlorhexidine  Gluconate Cloth  6 each Topical Daily   clopidogrel   75 mg Oral Daily   enoxaparin  (LOVENOX ) injection  30 mg Subcutaneous Q24H   escitalopram   5 mg Oral Daily   feeding supplement  237 mL Oral BID BM   ipratropium-albuterol   3 mL Nebulization Q6H   lipase/protease/amylase  60,000 Units Oral TID AC   metoprolol  succinate  25 mg Oral Daily   mirtazapine   15 mg Oral QHS   nicotine   14 mg Transdermal Daily   pantoprazole   40 mg Oral Daily   predniSONE   40 mg Oral Q breakfast   rosuvastatin   40 mg Oral Daily   sacubitril -valsartan   1 tablet Oral BID   sodium chloride  HYPERTONIC  4 mL Nebulization BID    Imaging and lab data personally reviewed   Author: True Atlas  05/09/2024 12:30 PM  To contact [facility]>   Check the care team in [facility] and look for the attending/consulting [facility] provider listed  Log into [email or web address] and use [place]'s universal password   Go to> [facility]  and find provider  If you still have difficulty reaching the provider, please page the [facility] (Director on Call) for the Hospitalists listed on amion

## 2024-05-10 ENCOUNTER — Encounter (HOSPITAL_COMMUNITY): Payer: Self-pay | Admitting: Internal Medicine

## 2024-05-10 ENCOUNTER — Inpatient Hospital Stay (HOSPITAL_COMMUNITY)

## 2024-05-10 LAB — BASIC METABOLIC PANEL WITH GFR
Anion gap: 10 (ref 5–15)
BUN: 41 mg/dL — ABNORMAL HIGH (ref 8–23)
CO2: 26 mmol/L (ref 22–32)
Calcium: 8.4 mg/dL — ABNORMAL LOW (ref 8.9–10.3)
Chloride: 100 mmol/L (ref 98–111)
Creatinine, Ser: 2.26 mg/dL — ABNORMAL HIGH (ref 0.61–1.24)
GFR, Estimated: 29 mL/min — ABNORMAL LOW (ref >60)
Glucose, Bld: 124 mg/dL — ABNORMAL HIGH (ref 70–99)
Potassium: 3.7 mmol/L (ref 3.5–5.1)
Sodium: 136 mmol/L (ref 135–145)

## 2024-05-10 LAB — CBC
HCT: 26.1 % — ABNORMAL LOW (ref 39.0–52.0)
Hemoglobin: 8.2 g/dL — ABNORMAL LOW (ref 13.0–17.0)
MCH: 30.6 pg (ref 26.0–34.0)
MCHC: 31.4 g/dL (ref 30.0–36.0)
MCV: 97.4 fL (ref 80.0–100.0)
Platelets: 84 K/uL — ABNORMAL LOW (ref 150–400)
RBC: 2.68 MIL/uL — ABNORMAL LOW (ref 4.22–5.81)
RDW: 15.3 % (ref 11.5–15.5)
WBC: 10 K/uL (ref 4.0–10.5)
nRBC: 0 % (ref 0.0–0.2)

## 2024-05-10 LAB — HEMOGLOBIN AND HEMATOCRIT, BLOOD
HCT: 28.8 % — ABNORMAL LOW (ref 39.0–52.0)
Hemoglobin: 8.9 g/dL — ABNORMAL LOW (ref 13.0–17.0)

## 2024-05-10 MED ORDER — IPRATROPIUM-ALBUTEROL 0.5-2.5 (3) MG/3ML IN SOLN
3.0000 mL | Freq: Three times a day (TID) | RESPIRATORY_TRACT | Status: DC
  Administered 2024-05-11 – 2024-05-13 (×9): 3 mL via RESPIRATORY_TRACT
  Filled 2024-05-10 (×9): qty 3

## 2024-05-10 MED ORDER — ALBUTEROL SULFATE (2.5 MG/3ML) 0.083% IN NEBU: 2.5000 mg | INHALATION_SOLUTION | RESPIRATORY_TRACT | Status: DC | PRN

## 2024-05-10 NOTE — Progress Notes (Signed)
   05/10/24 1630  TOC Brief Assessment  Insurance and Status Reviewed  Patient has primary care physician Yes  Home environment has been reviewed Single family home  Prior level of function: Independent with ADL's  Prior/Current Home Services No current home services  Social Drivers of Health Review SDOH reviewed no interventions necessary  Readmission risk has been reviewed Yes  Transition of care needs transition of care needs identified, TOC will continue to follow   Pt screened. IP CM will follow for DC planning needs.

## 2024-05-10 NOTE — Plan of Care (Signed)
  Problem: Health Behavior/Discharge Planning: Goal: Ability to manage health-related needs will improve Outcome: Progressing   Problem: Clinical Measurements: Goal: Respiratory complications will improve Outcome: Progressing Goal: Cardiovascular complication will be avoided Outcome: Progressing   Problem: Education: Goal: Knowledge of General Education information will improve Description: Including pain rating scale, medication(s)/side effects and non-pharmacologic comfort measures Outcome: Not Progressing   Problem: Clinical Measurements: Goal: Ability to maintain clinical measurements within normal limits will improve Outcome: Not Progressing Goal: Will remain free from infection Outcome: Not Progressing Goal: Diagnostic test results will improve Outcome: Not Progressing

## 2024-05-10 NOTE — Plan of Care (Signed)

## 2024-05-10 NOTE — Progress Notes (Signed)
 Speech Language Pathology Treatment: Dysphagia  Patient Details Name: [name] MRN: [id number] DOB: 24-Sep-1945 Today's Date: 05/10/2024 Time: 8961-8950 SLP Time Calculation (min) (ACUTE ONLY): 11 min  Assessment / Plan / Recommendation Clinical Impression  Patient was alert and awake. Patient observed drinking straw sips of water. No s/s of aspiration observed. Throughout the session, patient asked appropriate questions regarding his occasional cough and GERD. SLP educated patient on GERD and s/s aspiration. Patient did not have any concerns regarding his meals, PO intake, or swallowing. SLP and patient in agreement that SLP is signing off at this time.    HPI HPI: Pt is a 78 y.o. male who presented to ED with SOB and COPD exacerbation. Pt was admitted in October.  During this hospitalization he was treated for COPD and discharged with outpatient follow-up. RN placed BSE order due to pt consistently coughing with liquids. CXR (11/21) revealed, Progressive opacification of the right hemithorax related to diffuse airspace infiltrate, likely asymmetric pulmonary edema, with associated small pleural effusion. Previous BSE (07/2021) revealed no indications for aspiration. Regular diet/thin liquids recommended. PMH: abdominal aortic aneurysm, alcohol usage, CAD, GERD, hypertension, hyperlipidemia, lung cancer.      SLP Plan  All goals met          Recommendations  Diet recommendations: Regular;Thin liquid Liquids provided via: Cup;Straw Medication Administration: Other (Comment) (As tolerated) Supervision: Patient able to self feed Compensations: Minimize environmental distractions;Slow rate;Small sips/bites Postural Changes and/or Swallow Maneuvers: Seated upright 90 degrees;Upright 30-60 min after meal                  Oral care BID   None Dysphagia, unspecified (R13.10)     All goals met    Damien Hy  Graduate SLP Clinican

## 2024-05-10 NOTE — Progress Notes (Signed)
 NAME:  [name], MRN:  [id number], DOB:  [birth year], LOS: 3 ADMISSION DATE:  05/07/2024, CONSULTATION DATE: 05/07/2024 REFERRING MD:  GORMAN Atlas MD, CHIEF COMPLAINT: COPD exacerbation, right upper lung collapse  History of Present Illness:   Patient, with COPD and a history of stage 1B non-small cell lung cancer, presents with breathing difficulties and COPD exacerbation.  PCCM consulted for CT showing right upper lung collapse  Dyspnea and respiratory symptoms - Breathing difficulties characterized as painful and sudden onset - Persistent shortness of breath - Active tobacco use  Copd exacerbation - Recent hospitalization for COPD exacerbation one to two months ago - Follows with Dr. Zaida in the pulmonary clinic  Pulmonary neoplasm history and post-surgical changes - History of stage 1B non-small cell lung cancer, treated with right upper lobectomy in 2007 - No evidence of cancer recurrence - Bronchoscopy in Feb 2023 for CT nodularity in right main stem bronchus showed protrusion of the airway medially into the right mainstem bronchus due to his surgical resection, stump site.  - Biopsies and brushings from bronchoscopy in 2023 were negative.  PET scan in 2023 was negative for any uptake.  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   Significant Hospital Events: Including procedures, antibiotic start and stop dates in addition to other pertinent events   11/21 --> admitted to [facility] and pulmonology consulted, recommended aggressive mucociliary clearance for suspected mucous plugging. 11/24 CT chest shows increased secretions right mainstem, with opacification of airways in right middle and lower lobes, improved ground glass opacities  Interim History / Subjective:  On 3 L nasal cannula Coughing up some stuff Afebrile   Objective    Blood pressure 113/73, pulse 78, temperature 98 F (36.7 C), temperature source Oral, resp. rate 20, height 6' (1.829 m), weight 63.8 kg, SpO2 97%.    FiO2 (%):  [32 %] 32 %   Intake/Output Summary (Last 24 hours) at 05/10/2024 1612 Last data filed at 05/10/2024 0800 Gross per 24 hour  Intake 118 ml  Output 1300 ml  Net -1182 ml   Filed Weights   05/07/24 0235  Weight: 63.8 kg    Examination: General: NAD, alert, thin man, no distress  Eyes: PERRL, no scleral icterus ENMT: oropharynx clear, good dentition, no oral lesions, mallampati score I Skin: warm, intact, no rashes Neck: JVD flat, ROM and lymph node assessment normal CV: RRR, no MRG, nl S1 and S2, no peripheral edema Resp: Decreased breath sounds on right, no accessory muscle use Abdom: Normoactive bowel sounds, soft, nontender, nondistended, no hepatosplenomegaly Neuro: Awake , interactive, nonfocal  Labs show normal electrolytes, no leukocytosis, stable anemia  Resolved problem list   Assessment and Plan  COPD exacerbation with pneumonia and right upper lobe collapse due to post-surgical obstruction, mucus plugs COPD exacerbation with associated pneumonia and right upper lung collapse, likely due to post-surgical obstruction from previous right upper lobectomy. Possible mucus plug or endobronchial lesion causing obstruction.  Prior CT  showed nodularity in the right main stem bronchus, and bronchoscopy in 2023 revealed a benign protrusion at the right mainstem bronchus surgical resection site, with negative biopsies and brushings.  Repeat CT shows increased secretions - Continue antibiotics for pneumonia - Continue 40 mg prednisone  -Triple therapy nebs - Continue Mucomyst  and hypertonic saline medications - Continue chest physiotherapy to open right upper lobe - Continue oxygen therapy - Plan for bronchoscopy, unfortunately he is on Plavix  which we will start holding and plan for bronchoscopy on Friday  Tobacco use disorder  smoking cessation emphasized  Plan discussed with patient and hospitalist attending  Signature   Harden Staff MD. FCCP. [facility] Pager : [phone]  If no response to pager , please call [phone] until 7 pm After 7:00 pm call Elink  [phone]   05/10/2024

## 2024-05-10 NOTE — Plan of Care (Signed)
 MEWS Progress Note  Patient Details Name: [name] MRN: [id number] DOB: 07/21/1945 Today's Date: 05/10/2024   MEWS Flowsheet Documentation:  Assess: MEWS Score Temp: 98 F (36.7 C) BP: 113/73 MAP (mmHg): 85 Pulse Rate: 78 ECG Heart Rate: 92 Resp: (!) 28 Level of Consciousness: Alert SpO2: 97 % O2 Device: Nasal Cannula Patient Activity (if Appropriate): In bed O2 Flow Rate (L/min): 3 L/min FiO2 (%): 32 % Assess: MEWS Score MEWS Temp: 0 MEWS Systolic: 0 MEWS Pulse: 0 MEWS RR: 2 MEWS LOC: 0 MEWS Score: 2 MEWS Score Color: Yellow Assess: SIRS CRITERIA SIRS Temperature : 0 SIRS Respirations : 1 SIRS Pulse: 1 SIRS WBC: 0 SIRS Score Sum : 2 Assess: if the MEWS score is Yellow or Red Were vital signs accurate and taken at a resting state?: Yes        [name]  Mozqueda Jaramillo 05/10/2024, 2:53 PM   Problem: Education: Goal: Knowledge of General Education information will improve Description: Including pain rating scale, medication(s)/side effects and non-pharmacologic comfort measures Outcome: Progressing   Problem: Health Behavior/Discharge Planning: Goal: Ability to manage health-related needs will improve Outcome: Progressing   Problem: Clinical Measurements: Goal: Ability to maintain clinical measurements within normal limits will improve Outcome: Progressing Goal: Will remain free from infection Outcome: Progressing Goal: Diagnostic test results will improve Outcome: Progressing Goal: Respiratory complications will improve Outcome: Progressing Goal: Cardiovascular complication will be avoided Outcome: Progressing   Problem: Activity: Goal: Risk for activity intolerance will decrease Outcome: Progressing   Problem: Nutrition: Goal: Adequate nutrition will be maintained Outcome: Progressing   Problem: Coping: Goal: Level of anxiety will decrease Outcome: Progressing   Problem: Elimination: Goal: Will not experience  complications related to bowel motility Outcome: Progressing Goal: Will not experience complications related to urinary retention Outcome: Progressing   Problem: Pain Managment: Goal: General experience of comfort will improve and/or be controlled Outcome: Progressing   Problem: Safety: Goal: Ability to remain free from injury will improve Outcome: Progressing   Problem: Skin Integrity: Goal: Risk for impaired skin integrity will decrease Outcome: Progressing

## 2024-05-10 NOTE — Progress Notes (Signed)
 [facility] Progress Note  Patient: [name]     FMW:[id number]  DOA: 05/07/2024   PCP: [name], DO       Brief hospital course: 78 year old male with history of stage I non-small cell lung cancer, COPD, pancreatic insufficiency, chronic suprapubic catheter, coronary artery disease, hypertension, abdominal aortic aneurysm status post repair presented to the hospital for shortness of breath and wheezing. Of note, the patient was hospitalized from 03/26/2024 through 03/27/2024 for acute hypoxic respiratory failure felt to be secondary to COPD exacerbation.  He was discharged home on 3 L of oxygen.  In the ED, noted be tachypneic Chest x-ray showed diffuse airspace opacity in the right hemithorax with volume loss and elevated right hemidiaphragm. Creatinine noted to be 2.04, elevated from 1.40 previously. proBNP 18,466, troponin 79 WBC count 14.5. Respiratory virus panel, influenza, COVID and RSV negative. He was diagnosed with acute on chronic heart failure and a COPD exacerbation and treated with IV Lasix , DuoNebs, azithromycin  and steroids.   Subjective:  Very minimal cough.  No shortness of breath at rest.  No other complaints.  Assessment and Plan: Principal Problem:   Acute hypoxemic respiratory failure (HCC) - Of note, he was hospitalized about a month ago for COPD exacerbation and was discharged home on 3 L of oxygen - 11/21: The patient was on 10 L of oxygen & attempting to wean him down caused his pulse ox to drop into the 80s - He did not appear to be in any respiratory distress but has persistent cough and severe weakness - Chest x-ray suggesting opacification of right hemithorax of unclear origin-CT scan reveals right upper lobe collapse-have consulted pulmonary to evaluate for bronchoscopy - Added ceftriaxone  to azithromycin  to treat for possible underlying pneumonia - Continue prednisone  - Hypertonic nebs and Mucomyst  added - Appreciate pulmonary  assistance - He was weaned down to 3 L of oxygen-continue to see if he can be weaned any further - CT scan performed today shows increased mucous plugging & possible sequelae of aspiration    Active Problems: History of chronic systolic and diastolic heart failure -  Last echo from 11/7 revealed an EF of 50 to 55%, indeterminate LV diastolic parameters, moderately elevated pulmonary artery systolic pressure and mildly enlarged RV.  Both atria were dilated.  He does not appear to be volume overloaded on my exam -  Hold Lasix , continue Entresto  and Toprol     Tobacco use disorder -Have counseled him-he states that he has been smoking at least a half a pack a day - Nicotine  patch added  AKI - Baseline creatinine is about 1.4-creatinine 2.04 when admitted -Holding Lasix -not fluid overloaded  Hypokalemia - Replaced  A-flutter/fib-persistent - Chad Vasc 5 - Rate controlled - On Aspirin  and Plavix  (for PAD) which are being continued  Chronic thrombocytopenia  Pancreatic insufficiency? - Continue Creon   Chronic suprapubic catheter  Underweight Body mass index is 19.07 kg/m.  Patient     Code Status: Full Code Total time on patient care: 30 minutes DVT prophylaxis:  enoxaparin  (LOVENOX ) injection 30 mg Start: 05/07/24 1000 SCDs Start: 05/07/24 0413     Objective:   Vitals:   05/10/24 0600 05/10/24 0742 05/10/24 0905 05/10/24 0908  BP: (!) 123/56     Pulse: 81     Resp: 19     Temp:  (!) 97.5 F (36.4 C)    TempSrc:  Oral    SpO2: 99%  97% 97%  Weight:      Height:  Filed Weights   05/07/24 0235  Weight: 63.8 kg   Exam: General exam: Appears comfortable  HEENT: oral mucosa moist Respiratory system: Tachypnea-rhonchi-cough when asked to take a deep breath Cardiovascular system: S1 & S2 heard  Gastrointestinal system: Abdomen soft, non-tender, nondistended. Normal bowel sounds-suprapubic catheter present Extremities: No cyanosis, clubbing or  edema Psychiatry:  Mood & affect appropriate.    CBC: Recent Labs  Lab 05/07/24 0237 05/08/24 0216 05/10/24 0302  WBC 14.5* 11.7* 10.0  HGB 9.2* 8.4* 8.2*  HCT 29.8* 27.0* 26.1*  MCV 100.3* 98.2 97.4  PLT 78* 71* 84*   Basic Metabolic Panel: Recent Labs  Lab 05/07/24 0237 05/07/24 1336 05/07/24 1537 05/08/24 0216 05/10/24 0302  NA 138 134* 136 137 136  K 3.4* 3.6 3.6 3.6 3.7  CL 103 100 100 101 100  CO2 24 24 24 25 26   GLUCOSE 88 124* 128* 144* 124*  BUN 22 23 24* 28* 41*  CREATININE 2.04* 1.98* 2.04* 2.04* 2.26*  CALCIUM  8.7* 8.6* 8.7* 8.5* 8.4*     Scheduled Meds:  acetylcysteine   2 mL Nebulization BID   arformoterol   15 mcg Nebulization BID   aspirin  EC  81 mg Oral Daily   azithromycin   500 mg Oral Daily   budesonide  (PULMICORT ) nebulizer solution  0.25 mg Nebulization BID   Chlorhexidine  Gluconate Cloth  6 each Topical Daily   clopidogrel   75 mg Oral Daily   enoxaparin  (LOVENOX ) injection  30 mg Subcutaneous Q24H   escitalopram   5 mg Oral Daily   feeding supplement  237 mL Oral BID BM   ipratropium-albuterol   3 mL Nebulization Q6H   lipase/protease/amylase  60,000 Units Oral TID AC   metoprolol  succinate  25 mg Oral Daily   mirtazapine   15 mg Oral QHS   nicotine   14 mg Transdermal Daily   pantoprazole   40 mg Oral Daily   predniSONE   40 mg Oral Q breakfast   rosuvastatin   40 mg Oral Daily   sacubitril -valsartan   1 tablet Oral BID    Imaging and lab data personally reviewed   Author: Lorren Rossetti  05/10/2024 11:25 AM  To contact [facility]>   Check the care team in [facility] and look for the attending/consulting [facility] provider listed  Log into [email or web address] and use [place]'s universal password   Go to> [facility]  and find provider  If you still have difficulty reaching the provider, please page the [facility] (Director on Call) for the Hospitalists listed on amion

## 2024-05-11 NOTE — Plan of Care (Signed)
  Problem: Education: Goal: Knowledge of General Education information will improve Description: Including pain rating scale, medication(s)/side effects and non-pharmacologic comfort measures Outcome: Progressing   Problem: Health Behavior/Discharge Planning: Goal: Ability to manage health-related needs will improve Outcome: Not Progressing   Problem: Clinical Measurements: Goal: Ability to maintain clinical measurements within normal limits will improve Outcome: Not Progressing Goal: Will remain free from infection Outcome: Not Progressing Goal: Diagnostic test results will improve Outcome: Not Progressing Goal: Respiratory complications will improve Outcome: Not Progressing Goal: Cardiovascular complication will be avoided Outcome: Not Progressing

## 2024-05-11 NOTE — Progress Notes (Signed)
 NAME:  [name], MRN:  [id number], DOB:  02-26-1946, LOS: 4 ADMISSION DATE:  05/07/2024, CONSULTATION DATE: 05/07/2024 REFERRING MD:  GORMAN Atlas MD, CHIEF COMPLAINT: COPD exacerbation, right upper lung collapse  History of Present Illness:   Patient, with COPD and a history of stage 1B non-small cell lung cancer, presents with breathing difficulties and COPD exacerbation.  PCCM consulted for CT showing right upper lung collapse  Dyspnea and respiratory symptoms - Breathing difficulties characterized as painful and sudden onset - Persistent shortness of breath - Active tobacco use  Copd exacerbation - Recent hospitalization for COPD exacerbation one to two months ago - Follows with Dr. Zaida in the pulmonary clinic  Pulmonary neoplasm history and post-surgical changes - History of stage 1B non-small cell lung cancer, treated with right upper lobectomy in 2007 - No evidence of cancer recurrence - Bronchoscopy in Feb 2023 for CT nodularity in right main stem bronchus showed protrusion of the airway medially into the right mainstem bronchus due to his surgical resection, stump site.  - Biopsies and brushings from bronchoscopy in 2023 were negative.  PET scan in 2023 was negative for any uptake.  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   Significant Hospital Events: Including procedures, antibiotic start and stop dates in addition to other pertinent events   11/21 --> admitted to [facility] and pulmonology consulted, recommended aggressive mucociliary clearance for suspected mucous plugging. 11/24 CT chest shows increased secretions right mainstem, with opacification of airways in right middle and lower lobes, improved ground glass opacities  Interim History / Subjective:   Denies chest pain Dyspnea at baseline Able to lie supine In 3 L nasal cannula   Objective    Blood pressure 118/64, pulse 80, temperature 98.5 F (36.9 C), temperature source Oral, resp. rate 20, height 6' (1.829 m), weight 63.8 kg, SpO2 98%.        Intake/Output Summary (Last 24 hours) at 05/11/2024 0954 Last data filed at 05/11/2024 0100 Gross per 24 hour  Intake --  Output 400 ml  Net -400 ml   Filed Weights   05/07/24 0235  Weight: 63.8 kg    Examination: General: NAD, alert, thin man, no distress  Eyes: PERRL, no scleral icterus ENMT: oropharynx clear, poor dentition Skin: warm, intact, no rashes Neck: JVD flat, ROM and no lymphadenopathy CV: RRR, no MRG, nl S1 and S2, no peripheral edema Resp: Decreased breath sounds on right, no rhonchi, no accessory muscle use Abdom: soft, nontender, nondistended, no hepatosplenomegaly Neuro: Awake , interactive, nonfocal  Labs show normal electrolytes, no leukocytosis, stable anemia  Resolved problem list   Assessment and Plan  COPD exacerbation with pneumonia and right upper lobe collapse due to post-surgical obstruction, mucus plugs COPD exacerbation with associated pneumonia and right upper lung collapse, likely due to post-surgical obstruction from previous right upper lobectomy. Possible mucus plug or endobronchial lesion causing obstruction.  Prior CT showed nodularity in the right main stem  bronchus, and  bronchoscopy in 2023 revealed a benign protrusion at the right mainstem bronchus surgical resection site, with negative biopsies and brushings.  Repeat CT 11/24 shows increased secretions - Continue antibiotics for pneumonia - Continue 40 mg prednisone  -Triple therapy nebs - Continue Mucomyst  and hypertonic saline medications - Continue chest physiotherapy to open right upper lobe - Continue oxygen therapy - Plan for bronchoscopy on Friday tentatively at 8 AM, Plavix  has been held since 11/24  Tobacco use disorder  smoking cessation emphasized  Plan discussed with patient and hospitalist attending  Signature   Harden Staff MD. FCCP. [place] [facility] Pager : [phone]  If no response to pager , please call [phone] until 7 pm After 7:00 pm call Elink  [phone]   05/11/2024

## 2024-05-11 NOTE — Progress Notes (Signed)
 [facility] Progress Note  Patient: [name]     FMW:[id number]  DOA: 05/07/2024   PCP: [name], DO       Brief hospital course: 78 year old male with history of stage I non-small cell lung cancer, COPD, pancreatic insufficiency, chronic suprapubic catheter, coronary artery disease, hypertension, abdominal aortic aneurysm status post repair presented to the hospital for shortness of breath and wheezing. Of note, the patient was hospitalized from 03/26/2024 through 03/27/2024 for acute hypoxic respiratory failure felt to be secondary to COPD exacerbation.  He was discharged home on 3 L of oxygen.  In the ED, noted be tachypneic Chest x-ray showed diffuse airspace opacity in the right hemithorax with volume loss and elevated right hemidiaphragm. Creatinine noted to be 2.04, elevated from 1.40 previously. proBNP 18,466, troponin 79 WBC count 14.5. Respiratory virus panel, influenza, COVID and RSV negative. He was diagnosed with acute on chronic heart failure and a COPD exacerbation and treated with IV Lasix , DuoNebs, azithromycin  and steroids.   Subjective:  No cough.  No shortness of breath.  Assessment and Plan: Principal Problem:   Acute hypoxemic respiratory failure (HCC) - Of note, he was hospitalized about a month ago for COPD exacerbation and was discharged home on 3 L of oxygen - 11/21: The patient was on 10 L of oxygen & attempting to wean him down caused his pulse ox to drop into the 80s - He did not appear to be in any respiratory distress but has persistent cough and severe weakness - Chest x-ray suggesting opacification of right hemithorax of unclear origin-CT scan reveals right upper lobe collapse-have consulted pulmonary to evaluate for bronchoscopy - Added ceftriaxone  to azithromycin  to treat for possible underlying pneumonia - Continue prednisone  - Hypertonic nebs and Mucomyst  added - Appreciate pulmonary assistance - He was weaned down to 3 L  of oxygen-continue to see if he can be weaned any further - CT scan performed 11/24 shows increased mucous plugging & possible sequelae of aspiration - Pulmonary plans on bronchoscopy to ensure he has not had a cancer recurrence but will need Plavix  to be held for few days    Active Problems: History of chronic systolic and diastolic heart failure -  Last echo from 11/7 revealed an EF of 50 to 55%, indeterminate LV diastolic parameters, moderately elevated pulmonary artery systolic pressure and mildly enlarged RV.  Both atria were dilated.  He does not appear to be volume overloaded on my exam -  Hold Lasix , continue Entresto  and Toprol     Tobacco use disorder -Have counseled him-he states that he has been smoking at least a half a pack a day - Nicotine  patch added  AKI - Baseline creatinine is about 1.4-creatinine 2.04 when admitted -Holding Lasix -not fluid overloaded  Hypokalemia - Replaced  A-flutter/fib-persistent - Chad Vasc 5 - Rate controlled - On Aspirin  and Plavix  (for PAD)    Chronic thrombocytopenia  Pancreatic insufficiency? - Continue Creon   Chronic suprapubic catheter  Underweight Body mass index is 19.07 kg/m.  Patient     Code Status: Full Code Total time on patient care: 30 minutes DVT prophylaxis:  enoxaparin  (LOVENOX ) injection 30 mg Start: 05/07/24 1000 SCDs Start: 05/07/24 0413     Objective:   Vitals:   05/11/24 0327 05/11/24 0723 05/11/24 0750 05/11/24 0819  BP: 130/60 118/64  118/64  Pulse: 73 80  80  Resp: 20 20    Temp: 98.6 F (37 C) 98.5 F (36.9 C)    TempSrc: Oral Oral  SpO2: 99% 100% 98%   Weight:      Height:       Filed Weights   05/07/24 0235  Weight: 63.8 kg   Exam: General exam: Appears comfortable  HEENT: oral mucosa moist Respiratory system: Mild rhonchi bilaterally Cardiovascular system: S1 & S2 heard  Gastrointestinal system: Abdomen soft, non-tender, nondistended. Normal bowel sounds-suprapubic catheter  present Extremities: No cyanosis, clubbing or edema Psychiatry:  Mood & affect appropriate.    CBC: Recent Labs  Lab 05/07/24 0237 05/08/24 0216 05/10/24 0302 05/10/24 2114  WBC 14.5* 11.7* 10.0  --   HGB 9.2* 8.4* 8.2* 8.9*  HCT 29.8* 27.0* 26.1* 28.8*  MCV 100.3* 98.2 97.4  --   PLT 78* 71* 84*  --    Basic Metabolic Panel: Recent Labs  Lab 05/07/24 0237 05/07/24 1336 05/07/24 1537 05/08/24 0216 05/10/24 0302  NA 138 134* 136 137 136  K 3.4* 3.6 3.6 3.6 3.7  CL 103 100 100 101 100  CO2 24 24 24 25 26   GLUCOSE 88 124* 128* 144* 124*  BUN 22 23 24* 28* 41*  CREATININE 2.04* 1.98* 2.04* 2.04* 2.26*  CALCIUM  8.7* 8.6* 8.7* 8.5* 8.4*     Scheduled Meds:  acetylcysteine   2 mL Nebulization BID   arformoterol   15 mcg Nebulization BID   aspirin  EC  81 mg Oral Daily   azithromycin   500 mg Oral Daily   budesonide  (PULMICORT ) nebulizer solution  0.25 mg Nebulization BID   Chlorhexidine  Gluconate Cloth  6 each Topical Daily   enoxaparin  (LOVENOX ) injection  30 mg Subcutaneous Q24H   escitalopram   5 mg Oral Daily   feeding supplement  237 mL Oral BID BM   ipratropium-albuterol   3 mL Nebulization TID   lipase/protease/amylase  60,000 Units Oral TID AC   metoprolol  succinate  25 mg Oral Daily   mirtazapine   15 mg Oral QHS   nicotine   14 mg Transdermal Daily   pantoprazole   40 mg Oral Daily   predniSONE   40 mg Oral Q breakfast   rosuvastatin   40 mg Oral Daily   sacubitril -valsartan   1 tablet Oral BID    Imaging and lab data personally reviewed   Author: True Atlas  05/11/2024 12:36 PM  To contact [facility]>   Check the care team in [facility] and look for the attending/consulting [facility] provider listed  Log into [email or web address] and use [place]'s universal password   Go to> [facility]  and find provider  If you still have difficulty reaching the provider, please page the [facility] (Director on Call) for the Hospitalists listed on amion

## 2024-05-11 NOTE — Plan of Care (Signed)

## 2024-05-12 DIAGNOSIS — N189 Chronic kidney disease, unspecified: Secondary | ICD-10-CM

## 2024-05-12 DIAGNOSIS — J441 Chronic obstructive pulmonary disease with (acute) exacerbation: Principal | ICD-10-CM

## 2024-05-12 DIAGNOSIS — I480 Paroxysmal atrial fibrillation: Secondary | ICD-10-CM

## 2024-05-12 DIAGNOSIS — F17200 Nicotine dependence, unspecified, uncomplicated: Secondary | ICD-10-CM

## 2024-05-12 DIAGNOSIS — J189 Pneumonia, unspecified organism: Secondary | ICD-10-CM

## 2024-05-12 DIAGNOSIS — N179 Acute kidney failure, unspecified: Secondary | ICD-10-CM

## 2024-05-12 DIAGNOSIS — J810 Acute pulmonary edema: Secondary | ICD-10-CM

## 2024-05-12 DIAGNOSIS — J9621 Acute and chronic respiratory failure with hypoxia: Secondary | ICD-10-CM

## 2024-05-12 LAB — BASIC METABOLIC PANEL WITH GFR
Anion gap: 9 (ref 5–15)
BUN: 49 mg/dL — ABNORMAL HIGH (ref 8–23)
CO2: 27 mmol/L (ref 22–32)
Calcium: 8.3 mg/dL — ABNORMAL LOW (ref 8.9–10.3)
Chloride: 103 mmol/L (ref 98–111)
Creatinine, Ser: 2.17 mg/dL — ABNORMAL HIGH (ref 0.61–1.24)
GFR, Estimated: 30 mL/min — ABNORMAL LOW (ref >60)
Glucose, Bld: 94 mg/dL (ref 70–99)
Potassium: 3.7 mmol/L (ref 3.5–5.1)
Sodium: 139 mmol/L (ref 135–145)

## 2024-05-12 MED ORDER — FUROSEMIDE 10 MG/ML IJ SOLN
40.0000 mg | Freq: Every day | INTRAMUSCULAR | Status: DC
  Administered 2024-05-12 – 2024-05-14 (×3): 40 mg via INTRAVENOUS
  Filled 2024-05-12 (×3): qty 4

## 2024-05-12 MED ORDER — SODIUM CHLORIDE 0.9 % IV SOLN: INTRAVENOUS | Status: AC

## 2024-05-12 NOTE — Progress Notes (Addendum)
 Progress Note   Patient: [name] FMW:[id number] DOB: March 15, 1946 DOA: 05/07/2024  DOS: the patient was seen and examined on 05/12/2024   Brief hospital course:  78 year old male with history of stage I NSCLC, COPD, pancreatic insufficiency, chronic suprapubic catheter, coronary artery disease, hypertension, abdominal aortic aneurysm status post repair presented to the hospital for shortness of breath and wheezing.   Assessment and Plan:  Acute on chronic hypoxic respiratory failure - Tachypnea, dyspnea, presentation despite increase in O2.  Currently able to be weaned down to 3 L [place] which is patient's baseline.  Showing improvement this morning.  Right upper lobe postobstructive pneumonia  - CT scan noting right upper lobe lung collapse likely due to postsurgical obstruction from previous right upper lobectomy, mucous plug, or endobronchial lesion.  Empiric antibiotics on board, nebulizers, mucolytics.  Evaluated by PCCM.  Pending bronchoscopy Friday 11/28.  Plavix  on hold.  Acute exacerbation of chronic COPD - Exacerbated by above.  Appears to be showing improvement on current regimen.  History of stage 1B non-small cell lung cancer - Treated with right upper lobectomy in 2007.  No evidence of recurrence.  Biopsies and brushings from bronchoscopy 2023 were negative.  PET scan 2023 negative.  Chronic HFrEF - Last echo 11/7 noting EF 50-55%, BAE.  Appears euvolemic.  Entresto  metoprolol  on board.  Lasix  on hold for now.  Tobacco use disorder - Continues to smoke about half pack a day.  Encouraging cessation.  Nicotine  patch as needed.  Acute kidney injury - Previous baseline creatinine around 1.4 last month.  Now appears to be hovering around 2.  Monitor urine output and recheck BMP in AM.  Persistent atrial fibrillation/flutter - Appears to be rate controlled.  Normally on aspirin  and Plavix  for PAD.  Did not see anticoagulation.  Plavix  held for impending  bronc.  Pancreatic insufficiency - On Creon   Chronic suprapubic catheter - Noted.   Subjective: Patient resting comfortably this morning.  States he feels improved from yesterday.  Still having productive cough.  Denies any fever, chills, chest pain, worsening shortness of breath, nausea, vomiting, abdominal pain.  Physical Exam:  Vitals:   05/11/24 2047 05/11/24 2106 05/12/24 0433 05/12/24 0853  BP:  (!) 141/83 131/82   Pulse:  82 78   Resp:  18 18   Temp:  98.6 F (37 C) 98.6 F (37 C)   TempSrc:  Oral Oral   SpO2: 98% 100% 100% 91%  Weight:      Height:        GENERAL:  Alert, pleasant, no acute distress, frail HEENT:  EOMI, nasal cannula CARDIOVASCULAR: Irregularly irregular RESPIRATORY: Productive cough, poor air movement GASTROINTESTINAL: Suprapubic catheter, soft, nontender, nondistended EXTREMITIES:  No LE edema bilaterally NEURO:  No new focal deficits appreciated SKIN:  No rashes noted PSYCH:  Appropriate mood and affect     Data Reviewed:  Imaging Studies: CT CHEST WO CONTRAST Result Date: 05/10/2024 CLINICAL DATA:  Shortness of breath. Lung cancer. Endobronchial lesion. Right upper lobe collapse on previous CT. EXAM: CT CHEST WITHOUT CONTRAST TECHNIQUE: Multidetector CT imaging of the chest was performed following the standard protocol without IV contrast. RADIATION DOSE REDUCTION: This exam was performed according to the departmental dose-optimization program which includes automated exposure control, adjustment of the mA and/or kV according to patient size and/or use of iterative reconstruction technique. COMPARISON:  05/07/2024 FINDINGS: Cardiovascular: The heart size is upper normal to borderline enlarged. Coronary artery calcification is evident. Advanced atherosclerotic calcification is noted in the  wall of the thoracic aorta. Mediastinum/Nodes: No mediastinal lymphadenopathy. No evidence for gross hilar lymphadenopathy although assessment is limited by  the lack of intravenous contrast on the current study. The esophagus has normal imaging features. There is no axillary lymphadenopathy. Lungs/Pleura: Centrilobular and paraseptal emphysema evident. Volume loss in the right hemithorax is compatible with reported history of previous right upper lobectomy. Secretions/debris are seen in the right mainstem bronchus, increased in the interval with opacification of airways to the right middle and lower lobes. The interstitial and subtle ground-glass airspace opacity seen previously has improved in the interval. Small bilateral pleural effusions evident. Upper Abdomen: Status post abdominal aortic stent graft placement. Calcified gallstones noted. Cysts are evident in the right kidney, incompletely visualized. Bilateral nephrolithiasis. Musculoskeletal: No worrisome lytic or sclerotic osseous abnormality. IMPRESSION: 1. Interval increase in secretions/debris in the right mainstem bronchus with opacification of airways to the right middle and lower lobes. Interval aspiration since prior CT would be a consideration. 2. Sequelae of prior right upper lobectomy. 3. Interval improvement in the interstitial and subtle ground-glass airspace opacity seen previously. 4. Small bilateral pleural effusions. 5. Cholelithiasis. 6. Bilateral nephrolithiasis. 7. Aortic Atherosclerosis (ICD10-I70.0) and Emphysema (ICD10-J43.9). Electronically Signed   By: Camellia Candle M.D.   On: 05/10/2024 08:39   CT CHEST WO CONTRAST Result Date: 05/07/2024 EXAM: CT CHEST WITHOUT CONTRAST 05/07/2024 12:49:24 PM TECHNIQUE: CT of the chest was performed without the administration of intravenous contrast. Multiplanar reformatted images are provided for review. Automated exposure control, iterative reconstruction, and/or weight based adjustment of the mA/kV was utilized to reduce the radiation dose to as low as reasonably achievable. COMPARISON: 03/26/2024 CLINICAL HISTORY: Diffuse/interstitial lung  disease. FINDINGS: MEDIASTINUM: Heart and pericardium are unremarkable. The central airways are clear. Aortic atherosclerosis is again noted. LYMPH NODES: No mediastinal, hilar or axillary lymphadenopathy. LUNGS AND PLEURA: Emphysematous disease is again noted. There appears to be complete atelectasis of the right upper lobe, likely due to bronchial occlusion. Bronchoscopy is recommended. Mild bibasilar interstitial densities are noted, concerning for pulmonary edema. Minimal left pleural effusion is noted. No pneumothorax. SOFT TISSUES/BONES: No acute abnormality of the bones or soft tissues. UPPER ABDOMEN: Limited images of the upper abdomen demonstrate cholelithiasis and a right renal cyst. IMPRESSION: 1. Complete atelectasis of the right upper lobe, likely due to bronchial occlusion; bronchoscopy is recommended. 2. Mild bibasilar interstitial densities, concerning for pulmonary edema. 3. Minimal left pleural effusion. Electronically signed by: Lynwood Seip MD 05/07/2024 01:33 PM EST RP Workstation: [id number]   DG Chest Portable 1 View Result Date: 05/07/2024 EXAM: 1 VIEW(S) XRAY OF THE CHEST 05/07/2024 02:58:00 AM COMPARISON: 03/26/2024 CLINICAL HISTORY: sobetc FINDINGS: LINES, TUBES AND DEVICES: Partially imaged vascular stent in upper abdomen. LUNGS AND PLEURA: Volume loss in right lung with elevated right hemidiaphragm, stable since prior examination. Progressive opacification of the right hemithorax related to diffuse airspace infiltrate through the right hemithorax, likely asymmetric pulmonary edema. Small right pleural effusion. Trace interstitial pulmonary edema noted throughout the left lung. No pneumothorax. No pleural effusion on the left. HEART AND MEDIASTINUM: Aortic arch atherosclerotic calcifications. Mediastinal shift to right. Cardiac size within normal limits. BONES AND SOFT TISSUES: No acute osseous abnormality. IMPRESSION: 1. Progressive opacification of the right hemithorax related to  diffuse airspace infiltrate, likely asymmetric pulmonary edema, with associated small pleural effusion . 2. Trace interstitial pulmonary edema in the left lung. 3. Volume loss in the right lung with elevated right hemidiaphragm, stable since prior examination. Electronically signed by: Dorethia Molt MD 05/07/2024  04:04 AM EST RP Workstation: [id number]   ECHOCARDIOGRAM COMPLETE Result Date: 04/23/2024    ECHOCARDIOGRAM REPORT   Patient Name:   EMMAUS BRANDI Date of Exam: 04/23/2024 Medical Rec #:  [id number]       Height:       72.0 in Accession #:    [id number]      Weight:       135.3 lb Date of Birth:  1946-04-08       BSA:          1.805 m Patient Age:    78 years        BP:           144/60 mmHg Patient Gender: M               HR:           57 bpm. Exam Location:  [address] Procedure: 2D Echo, Cardiac Doppler and Color Doppler (Both Spectral and Color            Flow Doppler were utilized during procedure). Indications:    I50.22 Heart Failure with Reduced Ejection Fraction (HFrEF)  History:        Patient has prior history of Echocardiogram examinations, most                 recent 06/02/2023. CHF, CAD and Previous Myocardial Infarction,                 Abnormal ECG, Arrythmias:Atrial Fibrillation; Risk                 Factors:Hypertension, Dyslipidemia, Current Smoker and Family                 History of Coronary Artery Disease. HFrEF, AAA s/p Graft Repair,                 Chronic Renal Failure, History of Right Lung Cancer status post                 Lobectomy (2017).  Sonographer:    Heather Hawks RDCS Referring Phys: MADISON L FOUNTAIN IMPRESSIONS  1. Left ventricular ejection fraction, by estimation, is 50 to 55%. The left ventricle has low normal function. The left ventricle has no regional wall motion abnormalities. There is mild left ventricular hypertrophy. Left ventricular diastolic parameters are indeterminate.  2. Right ventricular systolic function is normal. The right ventricular size  is mildly enlarged. There is moderately elevated pulmonary artery systolic pressure. The estimated right ventricular systolic pressure is 54.6 mmHg.  3. Left atrial size was severely dilated.  4. Right atrial size was moderately dilated.  5. The mitral valve is normal in structure. Mild mitral valve regurgitation. No evidence of mitral stenosis.  6. Tricuspid valve regurgitation is moderate.  7. The aortic valve is normal in structure. Aortic valve regurgitation is mild. No aortic stenosis is present.  8. The inferior vena cava is normal in size with greater than 50% respiratory variability, suggesting right atrial pressure of 3 mmHg. FINDINGS  Left Ventricle: Left ventricular ejection fraction, by estimation, is 50 to 55%. The left ventricle has low normal function. The left ventricle has no regional wall motion abnormalities. The left ventricular internal cavity size was normal in size. There is mild left ventricular hypertrophy. Left ventricular diastolic parameters are indeterminate. Right Ventricle: The right ventricular size is mildly enlarged. No increase in right ventricular wall thickness. Right ventricular systolic function is normal. There is moderately elevated pulmonary  artery systolic pressure. The tricuspid regurgitant velocity is 3.59 m/s, and with an assumed right atrial pressure of 3 mmHg, the estimated right ventricular systolic pressure is 54.6 mmHg. Left Atrium: Left atrial size was severely dilated. Right Atrium: Right atrial size was moderately dilated. Pericardium: There is no evidence of pericardial effusion. Mitral Valve: The mitral valve is normal in structure. Mild mitral valve regurgitation. No evidence of mitral valve stenosis. Tricuspid Valve: The tricuspid valve is normal in structure. Tricuspid valve regurgitation is moderate . No evidence of tricuspid stenosis. Aortic Valve: The aortic valve is normal in structure. Aortic valve regurgitation is mild. No aortic stenosis is present.  Pulmonic Valve: The pulmonic valve was normal in structure. Pulmonic valve regurgitation is not visualized. No evidence of pulmonic stenosis. Aorta: The aortic root is normal in size and structure. Venous: The inferior vena cava is normal in size with greater than 50% respiratory variability, suggesting right atrial pressure of 3 mmHg. IAS/Shunts: No atrial level shunt detected by color flow Doppler.  LEFT VENTRICLE PLAX 2D LVIDd:         4.60 cm   Diastology LVIDs:         3.75 cm   LV e' medial:    10.30 cm/s LV PW:         1.20 cm   LV E/e' medial:  7.9 LV IVS:        1.20 cm   LV e' lateral:   11.45 cm/s LVOT diam:     2.60 cm   LV E/e' lateral: 7.1 LV SV:         96 LV SV Index:   53 LVOT Area:     5.31 cm LV IVRT:       100 msec  RIGHT VENTRICLE RV Basal diam:  4.30 cm     PULMONARY VEINS RV Mid diam:    3.10 cm     A Reversal Velocity: 46.10 cm/s RV S prime:     12.85 cm/s  Diastolic Velocity:  58.70 cm/s TAPSE (M-mode): 2.5 cm      S/D Velocity:        0.70 RVSP:           59.6 mmHg   Systolic Velocity:   39.90 cm/s LEFT ATRIUM              Index        RIGHT ATRIUM           Index LA diam:        4.10 cm  2.27 cm/m   RA Pressure: 8.00 mmHg LA Vol (A2C):   124.0 ml 68.72 ml/m  RA Area:     21.10 cm LA Vol (A4C):   96.9 ml  53.70 ml/m  RA Volume:   68.00 ml  37.68 ml/m LA Biplane Vol: 110.0 ml 60.96 ml/m  AORTIC VALVE LVOT Vmax:   90.48 cm/s LVOT Vmean:  55.850 cm/s LVOT VTI:    0.182 m  AORTA Ao Root diam: 3.60 cm Ao Asc diam:  3.40 cm MITRAL VALVE               TRICUSPID VALVE MV Area (PHT)  cm         TR Peak grad:   51.6 mmHg MV Decel Time: 184 msec    TR Vmax:        359.00 cm/s MR Peak grad: 132.2 mmHg   Estimated RAP:  8.00 mmHg MR Mean grad: 77.5 mmHg  RVSP:           59.6 mmHg MR Vmax:      575.00 cm/s MR Vmean:     405.0 cm/s   SHUNTS MV E velocity: 81.25 cm/s  Systemic VTI:  0.18 m MV A velocity: 35.50 cm/s  Systemic Diam: 2.60 cm MV E/A ratio:  2.29 Oneil Parchment MD Electronically signed  by Oneil Parchment MD Signature Date/Time: 04/23/2024/3:05:55 PM    Final     Results are pending, will review when available.  Previous records (including but not limited to H&P, progress notes, nursing notes, TOC management) were reviewed in assessment of this patient.  Labs: CBC: Recent Labs  Lab 05/07/24 0237 05/08/24 0216 05/10/24 0302 05/10/24 2114  WBC 14.5* 11.7* 10.0  --   HGB 9.2* 8.4* 8.2* 8.9*  HCT 29.8* 27.0* 26.1* 28.8*  MCV 100.3* 98.2 97.4  --   PLT 78* 71* 84*  --    Basic Metabolic Panel: Recent Labs  Lab 05/07/24 1336 05/07/24 1537 05/08/24 0216 05/10/24 0302 05/12/24 0645  NA 134* 136 137 136 139  K 3.6 3.6 3.6 3.7 3.7  CL 100 100 101 100 103  CO2 24 24 25 26 27   GLUCOSE 124* 128* 144* 124* 94  BUN 23 24* 28* 41* 49*  CREATININE 1.98* 2.04* 2.04* 2.26* 2.17*  CALCIUM  8.6* 8.7* 8.5* 8.4* 8.3*   Liver Function Tests: No results for input(s): AST, ALT, ALKPHOS, BILITOT, PROT, ALBUMIN  in the last 168 hours. CBG: No results for input(s): GLUCAP in the last 168 hours.  Scheduled Meds:  acetylcysteine   2 mL Nebulization BID   arformoterol   15 mcg Nebulization BID   aspirin  EC  81 mg Oral Daily   azithromycin   500 mg Oral Daily   budesonide  (PULMICORT ) nebulizer solution  0.25 mg Nebulization BID   Chlorhexidine  Gluconate Cloth  6 each Topical Daily   enoxaparin  (LOVENOX ) injection  30 mg Subcutaneous Q24H   escitalopram   5 mg Oral Daily   feeding supplement  237 mL Oral BID BM   ipratropium-albuterol   3 mL Nebulization TID   lipase/protease/amylase  60,000 Units Oral TID AC   metoprolol  succinate  25 mg Oral Daily   mirtazapine   15 mg Oral QHS   nicotine   14 mg Transdermal Daily   pantoprazole   40 mg Oral Daily   predniSONE   40 mg Oral Q breakfast   rosuvastatin   40 mg Oral Daily   sacubitril -valsartan   1 tablet Oral BID   Continuous Infusions:  cefTRIAXone  (ROCEPHIN )  IV 1 g (05/12/24 0825)   PRN Meds:.acetaminophen  **OR**  acetaminophen , albuterol , HYDROcodone -acetaminophen , ondansetron  **OR** ondansetron  (ZOFRAN ) IV, mouth rinse  Family Communication: Not at bedside  Disposition: Status is: Inpatient Remains inpatient appropriate because: Postobstructive pneumonia     Time spent: 41 minutes  Length of inpatient stay: 5 days  Author: Carliss LELON Canales, DO 05/12/2024 12:54 PM  For on call review [email or web address].

## 2024-05-12 NOTE — Hospital Course (Signed)
 78 year old male with history of stage I NSCLC, COPD, pancreatic insufficiency, chronic suprapubic catheter, coronary artery disease, hypertension, abdominal aortic aneurysm status post repair presented to the hospital for shortness of breath and wheezing.    Assessment and Plan:   Acute on chronic hypoxic respiratory failure - Tachypnea, dyspnea, presentation despite increase in O2.  Currently able to be weaned down to 3 L [employer] which is patient's baseline.  Showing improvement this morning.   Right upper lobe postobstructive pneumonia  - CT scan noting right upper lobe lung collapse likely due to postsurgical obstruction from previous right upper lobectomy, mucous plug, or endobronchial lesion.  Empiric antibiotics on board, nebulizers, mucolytics.  Evaluated by PCCM.  Pending bronchoscopy Friday 11/28.   Acute exacerbation of chronic COPD - Exacerbated by above.  Appears to be showing improvement on current regimen.   History of stage 1B non-small cell lung cancer - Treated with right upper lobectomy in 2007.  No evidence of recurrence.  Biopsies and brushings from bronchoscopy 2023 were negative.  PET scan 2023 negative.   Chronic HFrEF - Last echo 11/7 noting EF 50-55%, BAE.  Appears euvolemic.  Entresto  metoprolol  on board.  Lasix  on hold for now.   Tobacco use disorder - Continues to smoke about half pack a day.  Encouraging cessation.  Nicotine  patch as needed.   Acute kidney injury - Previous baseline creatinine around 1.4 last month.  Now appears to be hovering around 2.  Monitor urine output and recheck BMP in AM.   Persistent atrial fibrillation/flutter - Appears to be rate controlled.  On aspirin  and Plavix  for PAD.  Did not see anticoagulation.   Pancreatic insufficiency - On Creon    Chronic suprapubic catheter - Noted.

## 2024-05-13 DIAGNOSIS — I480 Paroxysmal atrial fibrillation: Secondary | ICD-10-CM | POA: Diagnosis not present

## 2024-05-13 DIAGNOSIS — F17200 Nicotine dependence, unspecified, uncomplicated: Secondary | ICD-10-CM

## 2024-05-13 DIAGNOSIS — N179 Acute kidney failure, unspecified: Secondary | ICD-10-CM | POA: Diagnosis not present

## 2024-05-13 DIAGNOSIS — J9601 Acute respiratory failure with hypoxia: Secondary | ICD-10-CM | POA: Diagnosis not present

## 2024-05-13 DIAGNOSIS — J189 Pneumonia, unspecified organism: Secondary | ICD-10-CM | POA: Diagnosis not present

## 2024-05-13 LAB — CBC
HCT: 26.4 % — ABNORMAL LOW (ref 39.0–52.0)
Hemoglobin: 8 g/dL — ABNORMAL LOW (ref 13.0–17.0)
MCH: 30.1 pg (ref 26.0–34.0)
MCHC: 30.3 g/dL (ref 30.0–36.0)
MCV: 99.2 fL (ref 80.0–100.0)
Platelets: 91 K/uL — ABNORMAL LOW (ref 150–400)
RBC: 2.66 MIL/uL — ABNORMAL LOW (ref 4.22–5.81)
RDW: 14.9 % (ref 11.5–15.5)
WBC: 7.2 K/uL (ref 4.0–10.5)
nRBC: 0 % (ref 0.0–0.2)

## 2024-05-13 LAB — BASIC METABOLIC PANEL WITH GFR
Anion gap: 9 (ref 5–15)
BUN: 49 mg/dL — ABNORMAL HIGH (ref 8–23)
CO2: 26 mmol/L (ref 22–32)
Calcium: 8.2 mg/dL — ABNORMAL LOW (ref 8.9–10.3)
Chloride: 105 mmol/L (ref 98–111)
Creatinine, Ser: 2.18 mg/dL — ABNORMAL HIGH (ref 0.61–1.24)
GFR, Estimated: 30 mL/min — ABNORMAL LOW (ref >60)
Glucose, Bld: 107 mg/dL — ABNORMAL HIGH (ref 70–99)
Potassium: 3.5 mmol/L (ref 3.5–5.1)
Sodium: 139 mmol/L (ref 135–145)

## 2024-05-13 NOTE — Progress Notes (Signed)
 NAME:  [name], MRN:  [id number], DOB:  08/13/1945, LOS: 6 ADMISSION DATE:  05/07/2024, CONSULTATION DATE: 05/07/2024 REFERRING MD:  GORMAN Atlas MD, CHIEF COMPLAINT: COPD exacerbation, right upper lung collapse  History of Present Illness:   Patient, with COPD and a history of stage 1B non-small cell lung cancer, presents with breathing difficulties and COPD exacerbation.  PCCM consulted for CT showing right upper lung collapse  Dyspnea and respiratory symptoms - Breathing difficulties characterized as painful and sudden onset - Persistent shortness of breath - Active tobacco use  Copd exacerbation - Recent hospitalization for COPD exacerbation one to two months ago - Follows with Dr. Zaida in the pulmonary clinic  Pulmonary neoplasm history and post-surgical changes - History of stage 1B non-small cell lung cancer, treated with right upper lobectomy in 2007 - No evidence of cancer recurrence - Bronchoscopy in Feb 2023 for CT nodularity in right main stem bronchus showed protrusion of the airway medially into the right mainstem bronchus due to his surgical resection, stump site.  - Biopsies and brushings from bronchoscopy in 2023 were negative.  PET scan in 2023 was negative for any uptake.  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   Significant Hospital Events: Including procedures, antibiotic start and stop dates in addition to other pertinent events   11/21 --> admitted to [facility] and pulmonology consulted, recommended aggressive mucociliary clearance for suspected mucous plugging. 11/24 CT chest shows increased secretions right mainstem, with opacification of airways in right middle and lower lobes, improved ground glass opacities  Interim History / Subjective:   Breathing is improved  Cough with dark sputum On 3 L nasal cannula   Objective    Blood pressure 125/74, pulse 75, temperature 98.7 F (37.1 C), temperature source Oral, resp. rate 18, height 6' (1.829 m), weight 63.8 kg, SpO2 97%.    FiO2 (%):  [95 %] 95 %   Intake/Output Summary (Last 24 hours) at 05/13/2024 0930 Last data filed at 05/13/2024 0550 Gross per 24 hour  Intake 247.52 ml  Output 1150 ml  Net -902.48 ml   Filed Weights   05/07/24 0235  Weight: 63.8 kg    Examination: General: NAD, alert, thin man, no distress  Eyes: PERRL, no scleral icterus ENMT: oropharynx clear, poor dentition Skin: warm, intact, no rashes Neck: JVD flat, ROM and no lymphadenopathy CV: RRR, no MRG, nl S1 and S2, no peripheral edema Resp: No accessory muscle use, decreased breath sounds on right, no rhonchi Abdom: soft, nontender, nondistended, no hepatosplenomegaly Neuro: Awake , interactive, nonfocal, no asterixis  Labs show stable anemia, no leukocytosis, mild hypokalemia, stable renal function 49/2.2  Resolved problem list   Assessment and Plan  COPD exacerbation with pneumonia and right upper lobe collapse due to post-surgical obstruction, mucus plugs Chronic respiratory failure with hypoxia COPD exacerbation with associated pneumonia and right upper lung collapse, likely due to post-surgical obstruction from previous right upper lobectomy. Possible mucus plug  or endobronchial  lesion causing obstruction.  Prior CT showed nodularity in the right main stem bronchus, and bronchoscopy in 2023 revealed a benign protrusion at the right mainstem bronchus surgical resection site, with negative biopsies and brushings.  Repeat CT 11/24 shows increased secretions and endobronchial obstruction - Continue antibiotics for pneumonia - Continue 40 mg prednisone  -Triple therapy nebs - Continue Mucomyst  and hypertonic saline medications - Continue chest physiotherapy to open right upper lobe - Continue oxygen therapy - Plan for bronchoscopy tomorrow 8 AM, Plavix  has been held since 11/24, risks and benefits of procedure discussed with the patient in detail including alternative of serial follow-up.  N.p.o. after midnight  Tobacco use disorder  smoking cessation emphasized    Signature   Harden Staff MD. FCCP. [facility] Pager : [phone]  If no response to pager , please call [phone] until 7 pm After 7:00 pm call Elink  [phone]   05/13/2024

## 2024-05-13 NOTE — H&P (View-Only) (Signed)
 NAME:  [name], MRN:  [id number], DOB:  08/13/1945, LOS: 6 ADMISSION DATE:  05/07/2024, CONSULTATION DATE: 05/07/2024 REFERRING MD:  GORMAN Atlas MD, CHIEF COMPLAINT: COPD exacerbation, right upper lung collapse  History of Present Illness:   Patient, with COPD and a history of stage 1B non-small cell lung cancer, presents with breathing difficulties and COPD exacerbation.  PCCM consulted for CT showing right upper lung collapse  Dyspnea and respiratory symptoms - Breathing difficulties characterized as painful and sudden onset - Persistent shortness of breath - Active tobacco use  Copd exacerbation - Recent hospitalization for COPD exacerbation one to two months ago - Follows with Dr. Zaida in the pulmonary clinic  Pulmonary neoplasm history and post-surgical changes - History of stage 1B non-small cell lung cancer, treated with right upper lobectomy in 2007 - No evidence of cancer recurrence - Bronchoscopy in Feb 2023 for CT nodularity in right main stem bronchus showed protrusion of the airway medially into the right mainstem bronchus due to his surgical resection, stump site.  - Biopsies and brushings from bronchoscopy in 2023 were negative.  PET scan in 2023 was negative for any uptake.  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   Significant Hospital Events: Including procedures, antibiotic start and stop dates in addition to other pertinent events   11/21 --> admitted to [facility] and pulmonology consulted, recommended aggressive mucociliary clearance for suspected mucous plugging. 11/24 CT chest shows increased secretions right mainstem, with opacification of airways in right middle and lower lobes, improved ground glass opacities  Interim History / Subjective:   Breathing is improved  Cough with dark sputum On 3 L nasal cannula   Objective    Blood pressure 125/74, pulse 75, temperature 98.7 F (37.1 C), temperature source Oral, resp. rate 18, height 6' (1.829 m), weight 63.8 kg, SpO2 97%.    FiO2 (%):  [95 %] 95 %   Intake/Output Summary (Last 24 hours) at 05/13/2024 0930 Last data filed at 05/13/2024 0550 Gross per 24 hour  Intake 247.52 ml  Output 1150 ml  Net -902.48 ml   Filed Weights   05/07/24 0235  Weight: 63.8 kg    Examination: General: NAD, alert, thin man, no distress  Eyes: PERRL, no scleral icterus ENMT: oropharynx clear, poor dentition Skin: warm, intact, no rashes Neck: JVD flat, ROM and no lymphadenopathy CV: RRR, no MRG, nl S1 and S2, no peripheral edema Resp: No accessory muscle use, decreased breath sounds on right, no rhonchi Abdom: soft, nontender, nondistended, no hepatosplenomegaly Neuro: Awake , interactive, nonfocal, no asterixis  Labs show stable anemia, no leukocytosis, mild hypokalemia, stable renal function 49/2.2  Resolved problem list   Assessment and Plan  COPD exacerbation with pneumonia and right upper lobe collapse due to post-surgical obstruction, mucus plugs Chronic respiratory failure with hypoxia COPD exacerbation with associated pneumonia and right upper lung collapse, likely due to post-surgical obstruction from previous right upper lobectomy. Possible mucus plug  or endobronchial  lesion causing obstruction.  Prior CT showed nodularity in the right main stem bronchus, and bronchoscopy in 2023 revealed a benign protrusion at the right mainstem bronchus surgical resection site, with negative biopsies and brushings.  Repeat CT 11/24 shows increased secretions and endobronchial obstruction - Continue antibiotics for pneumonia - Continue 40 mg prednisone  -Triple therapy nebs - Continue Mucomyst  and hypertonic saline medications - Continue chest physiotherapy to open right upper lobe - Continue oxygen therapy - Plan for bronchoscopy tomorrow 8 AM, Plavix  has been held since 11/24, risks and benefits of procedure discussed with the patient in detail including alternative of serial follow-up.  N.p.o. after midnight  Tobacco use disorder  smoking cessation emphasized    Signature   Harden Staff MD. FCCP. [facility] Pager : [phone]  If no response to pager , please call [phone] until 7 pm After 7:00 pm call Elink  [phone]   05/13/2024

## 2024-05-13 NOTE — Progress Notes (Signed)
 Progress Note   Patient: [name] FMW:[id number] DOB: [birth year] DOA: 05/07/2024  DOS: the patient was seen and examined on 05/13/2024   Brief hospital course:  78 year old male with history of stage I NSCLC, COPD, pancreatic insufficiency, chronic suprapubic catheter, coronary artery disease, hypertension, abdominal aortic aneurysm status post repair presented to the hospital for shortness of breath and wheezing.    Assessment and Plan:   Acute on chronic hypoxic respiratory failure - Tachypnea, dyspnea, presentation despite increase in O2.  Currently able to be weaned down to 3 L [place] which is patient's baseline.  Showing improvement this morning.   Right upper lobe postobstructive pneumonia  - CT scan noting right upper lobe lung collapse likely due to postsurgical obstruction from previous right upper lobectomy, mucous plug, or endobronchial lesion.  Empiric antibiotics on board, nebulizers, mucolytics.  Evaluated by PCCM.  Pending bronchoscopy tomorrow 11/28.  Plavix  on hold.   Acute exacerbation of chronic COPD - Exacerbated by above.  Appears to be showing improvement on current regimen.   History of stage 1B non-small cell lung cancer - Treated with right upper lobectomy in 2007.  No evidence of recurrence.  Biopsies and brushings from bronchoscopy 2023 were negative.  PET scan 2023 negative.   Chronic HFrEF - Last echo 11/7 noting EF 50-55%, BAE.  Appears euvolemic.  Entresto  metoprolol  on board.  Lasix  on hold for now.   Tobacco use disorder - Continues to smoke about half pack a day.  Encouraging cessation.  Nicotine  patch as needed.   Acute kidney injury - Previous baseline creatinine around 1.4 last month.  Now appears to be hovering around 2.  Monitor urine output and recheck BMP in AM.   Persistent atrial fibrillation/flutter - Appears to be rate controlled.  Normally on aspirin  and Plavix  for PAD.  Did not see anticoagulation.  Plavix  held for impending bronc.    Pancreatic insufficiency - On Creon    Chronic suprapubic catheter - Noted.   Subjective: Patient evaluated this morning, receiving breathing treatment and percussion vest.  States he is doing well, improved.  Breathing easier.  Still has productive cough.  Denies any worsening shortness of breath, fever, chills, chest pain, nausea, vomiting, abdominal pain.  Eager for bronchoscopy tomorrow.  Physical Exam:  Vitals:   05/12/24 1355 05/12/24 1955 05/12/24 2009 05/13/24 0521  BP: 115/73 121/72  125/74  Pulse: 73 73  75  Resp: 16 16  18   Temp: 99.3 F (37.4 C) 99.5 F (37.5 C)  98.7 F (37.1 C)  TempSrc: Oral Oral  Oral  SpO2: 100% 100% 99% 97%  Weight:      Height:        GENERAL:  Alert, pleasant, no acute distress, frail HEENT:  EOMI, nasal cannula CARDIOVASCULAR: Irregularly irregular RESPIRATORY: Productive cough, poor air movement GASTROINTESTINAL: Suprapubic catheter, soft, nontender, nondistended EXTREMITIES:  No LE edema bilaterally NEURO:  No new focal deficits appreciated SKIN:  No rashes noted PSYCH:  Appropriate mood and affect    Data Reviewed:  Imaging Studies: CT CHEST WO CONTRAST Result Date: 05/10/2024 CLINICAL DATA:  Shortness of breath. Lung cancer. Endobronchial lesion. Right upper lobe collapse on previous CT. EXAM: CT CHEST WITHOUT CONTRAST TECHNIQUE: Multidetector CT imaging of the chest was performed following the standard protocol without IV contrast. RADIATION DOSE REDUCTION: This exam was performed according to the departmental dose-optimization program which includes automated exposure control, adjustment of the mA and/or kV according to patient size and/or use of iterative reconstruction technique. COMPARISON:  05/07/2024 FINDINGS: Cardiovascular: The heart size is upper normal to borderline enlarged. Coronary artery calcification is evident. Advanced atherosclerotic calcification is noted in the wall of the thoracic aorta. Mediastinum/Nodes: No  mediastinal lymphadenopathy. No evidence for gross hilar lymphadenopathy although assessment is limited by the lack of intravenous contrast on the current study. The esophagus has normal imaging features. There is no axillary lymphadenopathy. Lungs/Pleura: Centrilobular and paraseptal emphysema evident. Volume loss in the right hemithorax is compatible with reported history of previous right upper lobectomy. Secretions/debris are seen in the right mainstem bronchus, increased in the interval with opacification of airways to the right middle and lower lobes. The interstitial and subtle ground-glass airspace opacity seen previously has improved in the interval. Small bilateral pleural effusions evident. Upper Abdomen: Status post abdominal aortic stent graft placement. Calcified gallstones noted. Cysts are evident in the right kidney, incompletely visualized. Bilateral nephrolithiasis. Musculoskeletal: No worrisome lytic or sclerotic osseous abnormality. IMPRESSION: 1. Interval increase in secretions/debris in the right mainstem bronchus with opacification of airways to the right middle and lower lobes. Interval aspiration since prior CT would be a consideration. 2. Sequelae of prior right upper lobectomy. 3. Interval improvement in the interstitial and subtle ground-glass airspace opacity seen previously. 4. Small bilateral pleural effusions. 5. Cholelithiasis. 6. Bilateral nephrolithiasis. 7. Aortic Atherosclerosis (ICD10-I70.0) and Emphysema (ICD10-J43.9). Electronically Signed   By: Camellia Candle M.D.   On: 05/10/2024 08:39   CT CHEST WO CONTRAST Result Date: 05/07/2024 EXAM: CT CHEST WITHOUT CONTRAST 05/07/2024 12:49:24 PM TECHNIQUE: CT of the chest was performed without the administration of intravenous contrast. Multiplanar reformatted images are provided for review. Automated exposure control, iterative reconstruction, and/or weight based adjustment of the mA/kV was utilized to reduce the radiation dose to  as low as reasonably achievable. COMPARISON: 03/26/2024 CLINICAL HISTORY: Diffuse/interstitial lung disease. FINDINGS: MEDIASTINUM: Heart and pericardium are unremarkable. The central airways are clear. Aortic atherosclerosis is again noted. LYMPH NODES: No mediastinal, hilar or axillary lymphadenopathy. LUNGS AND PLEURA: Emphysematous disease is again noted. There appears to be complete atelectasis of the right upper lobe, likely due to bronchial occlusion. Bronchoscopy is recommended. Mild bibasilar interstitial densities are noted, concerning for pulmonary edema. Minimal left pleural effusion is noted. No pneumothorax. SOFT TISSUES/BONES: No acute abnormality of the bones or soft tissues. UPPER ABDOMEN: Limited images of the upper abdomen demonstrate cholelithiasis and a right renal cyst. IMPRESSION: 1. Complete atelectasis of the right upper lobe, likely due to bronchial occlusion; bronchoscopy is recommended. 2. Mild bibasilar interstitial densities, concerning for pulmonary edema. 3. Minimal left pleural effusion. Electronically signed by: Lynwood Seip MD 05/07/2024 01:33 PM EST RP Workstation: [id number]   DG Chest Portable 1 View Result Date: 05/07/2024 EXAM: 1 VIEW(S) XRAY OF THE CHEST 05/07/2024 02:58:00 AM COMPARISON: 03/26/2024 CLINICAL HISTORY: sobetc FINDINGS: LINES, TUBES AND DEVICES: Partially imaged vascular stent in upper abdomen. LUNGS AND PLEURA: Volume loss in right lung with elevated right hemidiaphragm, stable since prior examination. Progressive opacification of the right hemithorax related to diffuse airspace infiltrate through the right hemithorax, likely asymmetric pulmonary edema. Small right pleural effusion. Trace interstitial pulmonary edema noted throughout the left lung. No pneumothorax. No pleural effusion on the left. HEART AND MEDIASTINUM: Aortic arch atherosclerotic calcifications. Mediastinal shift to right. Cardiac size within normal limits. BONES AND SOFT TISSUES: No acute  osseous abnormality. IMPRESSION: 1. Progressive opacification of the right hemithorax related to diffuse airspace infiltrate, likely asymmetric pulmonary edema, with associated small pleural effusion . 2. Trace interstitial pulmonary edema in the  left lung. 3. Volume loss in the right lung with elevated right hemidiaphragm, stable since prior examination. Electronically signed by: Dorethia Molt MD 05/07/2024 04:04 AM EST RP Workstation: [id number]   ECHOCARDIOGRAM COMPLETE Result Date: 04/23/2024    ECHOCARDIOGRAM REPORT   Patient Name:   [name] Date of Exam: 04/23/2024 Medical Rec #:  [id number]       Height:       72.0 in Accession #:    [id number]      Weight:       135.3 lb Date of Birth:  1946-02-27       BSA:          1.805 m Patient Age:    78 years        BP:           144/60 mmHg Patient Gender: M               HR:           57 bpm. Exam Location:  [address] Procedure: 2D Echo, Cardiac Doppler and Color Doppler (Both Spectral and Color            Flow Doppler were utilized during procedure). Indications:    I50.22 Heart Failure with Reduced Ejection Fraction (HFrEF)  History:        Patient has prior history of Echocardiogram examinations, most                 recent 06/02/2023. CHF, CAD and Previous Myocardial Infarction,                 Abnormal ECG, Arrythmias:Atrial Fibrillation; Risk                 Factors:Hypertension, Dyslipidemia, Current Smoker and Family                 History of Coronary Artery Disease. HFrEF, AAA s/p Graft Repair,                 Chronic Renal Failure, History of Right Lung Cancer status post                 Lobectomy (2017).  Sonographer:    Heather Hawks RDCS Referring Phys: MADISON L FOUNTAIN IMPRESSIONS  1. Left ventricular ejection fraction, by estimation, is 50 to 55%. The left ventricle has low normal function. The left ventricle has no regional wall motion abnormalities. There is mild left ventricular hypertrophy. Left ventricular diastolic parameters  are indeterminate.  2. Right ventricular systolic function is normal. The right ventricular size is mildly enlarged. There is moderately elevated pulmonary artery systolic pressure. The estimated right ventricular systolic pressure is 54.6 mmHg.  3. Left atrial size was severely dilated.  4. Right atrial size was moderately dilated.  5. The mitral valve is normal in structure. Mild mitral valve regurgitation. No evidence of mitral stenosis.  6. Tricuspid valve regurgitation is moderate.  7. The aortic valve is normal in structure. Aortic valve regurgitation is mild. No aortic stenosis is present.  8. The inferior vena cava is normal in size with greater than 50% respiratory variability, suggesting right atrial pressure of 3 mmHg. FINDINGS  Left Ventricle: Left ventricular ejection fraction, by estimation, is 50 to 55%. The left ventricle has low normal function. The left ventricle has no regional wall motion abnormalities. The left ventricular internal cavity size was normal in size. There is mild left ventricular hypertrophy. Left ventricular diastolic parameters are indeterminate. Right Ventricle: The  right ventricular size is mildly enlarged. No increase in right ventricular wall thickness. Right ventricular systolic function is normal. There is moderately elevated pulmonary artery systolic pressure. The tricuspid regurgitant velocity is 3.59 m/s, and with an assumed right atrial pressure of 3 mmHg, the estimated right ventricular systolic pressure is 54.6 mmHg. Left Atrium: Left atrial size was severely dilated. Right Atrium: Right atrial size was moderately dilated. Pericardium: There is no evidence of pericardial effusion. Mitral Valve: The mitral valve is normal in structure. Mild mitral valve regurgitation. No evidence of mitral valve stenosis. Tricuspid Valve: The tricuspid valve is normal in structure. Tricuspid valve regurgitation is moderate . No evidence of tricuspid stenosis. Aortic Valve: The aortic  valve is normal in structure. Aortic valve regurgitation is mild. No aortic stenosis is present. Pulmonic Valve: The pulmonic valve was normal in structure. Pulmonic valve regurgitation is not visualized. No evidence of pulmonic stenosis. Aorta: The aortic root is normal in size and structure. Venous: The inferior vena cava is normal in size with greater than 50% respiratory variability, suggesting right atrial pressure of 3 mmHg. IAS/Shunts: No atrial level shunt detected by color flow Doppler.  LEFT VENTRICLE PLAX 2D LVIDd:         4.60 cm   Diastology LVIDs:         3.75 cm   LV e' medial:    10.30 cm/s LV PW:         1.20 cm   LV E/e' medial:  7.9 LV IVS:        1.20 cm   LV e' lateral:   11.45 cm/s LVOT diam:     2.60 cm   LV E/e' lateral: 7.1 LV SV:         96 LV SV Index:   53 LVOT Area:     5.31 cm LV IVRT:       100 msec  RIGHT VENTRICLE RV Basal diam:  4.30 cm     PULMONARY VEINS RV Mid diam:    3.10 cm     A Reversal Velocity: 46.10 cm/s RV S prime:     12.85 cm/s  Diastolic Velocity:  58.70 cm/s TAPSE (M-mode): 2.5 cm      S/D Velocity:        0.70 RVSP:           59.6 mmHg   Systolic Velocity:   39.90 cm/s LEFT ATRIUM              Index        RIGHT ATRIUM           Index LA diam:        4.10 cm  2.27 cm/m   RA Pressure: 8.00 mmHg LA Vol (A2C):   124.0 ml 68.72 ml/m  RA Area:     21.10 cm LA Vol (A4C):   96.9 ml  53.70 ml/m  RA Volume:   68.00 ml  37.68 ml/m LA Biplane Vol: 110.0 ml 60.96 ml/m  AORTIC VALVE LVOT Vmax:   90.48 cm/s LVOT Vmean:  55.850 cm/s LVOT VTI:    0.182 m  AORTA Ao Root diam: 3.60 cm Ao Asc diam:  3.40 cm MITRAL VALVE               TRICUSPID VALVE MV Area (PHT)  cm         TR Peak grad:   51.6 mmHg MV Decel Time: 184 msec    TR Vmax:  359.00 cm/s MR Peak grad: 132.2 mmHg   Estimated RAP:  8.00 mmHg MR Mean grad: 77.5 mmHg    RVSP:           59.6 mmHg MR Vmax:      575.00 cm/s MR Vmean:     405.0 cm/s   SHUNTS MV E velocity: 81.25 cm/s  Systemic VTI:  0.18 m MV A  velocity: 35.50 cm/s  Systemic Diam: 2.60 cm MV E/A ratio:  2.29 Oneil Parchment MD Electronically signed by Oneil Parchment MD Signature Date/Time: 04/23/2024/3:05:55 PM    Final     There are no new results to review at this time.  Previous records (including but not limited to H&P, progress notes, nursing notes, TOC management) were reviewed in assessment of this patient.  Labs: CBC: Recent Labs  Lab 05/07/24 0237 05/08/24 0216 05/10/24 0302 05/10/24 2114 05/13/24 0543  WBC 14.5* 11.7* 10.0  --  7.2  HGB 9.2* 8.4* 8.2* 8.9* 8.0*  HCT 29.8* 27.0* 26.1* 28.8* 26.4*  MCV 100.3* 98.2 97.4  --  99.2  PLT 78* 71* 84*  --  91*   Basic Metabolic Panel: Recent Labs  Lab 05/07/24 1537 05/08/24 0216 05/10/24 0302 05/12/24 0645 05/13/24 0543  NA 136 137 136 139 139  K 3.6 3.6 3.7 3.7 3.5  CL 100 101 100 103 105  CO2 24 25 26 27 26   GLUCOSE 128* 144* 124* 94 107*  BUN 24* 28* 41* 49* 49*  CREATININE 2.04* 2.04* 2.26* 2.17* 2.18*  CALCIUM  8.7* 8.5* 8.4* 8.3* 8.2*   Liver Function Tests: No results for input(s): AST, ALT, ALKPHOS, BILITOT, PROT, ALBUMIN  in the last 168 hours. CBG: No results for input(s): GLUCAP in the last 168 hours.  Scheduled Meds:  acetylcysteine   2 mL Nebulization BID   arformoterol   15 mcg Nebulization BID   aspirin  EC  81 mg Oral Daily   azithromycin   500 mg Oral Daily   budesonide  (PULMICORT ) nebulizer solution  0.25 mg Nebulization BID   Chlorhexidine  Gluconate Cloth  6 each Topical Daily   enoxaparin  (LOVENOX ) injection  30 mg Subcutaneous Q24H   escitalopram   5 mg Oral Daily   feeding supplement  237 mL Oral BID BM   furosemide   40 mg Intravenous Daily   ipratropium-albuterol   3 mL Nebulization TID   lipase/protease/amylase  60,000 Units Oral TID AC   metoprolol  succinate  25 mg Oral Daily   mirtazapine   15 mg Oral QHS   nicotine   14 mg Transdermal Daily   pantoprazole   40 mg Oral Daily   predniSONE   40 mg Oral Q breakfast    rosuvastatin   40 mg Oral Daily   sacubitril -valsartan   1 tablet Oral BID   Continuous Infusions:  sodium chloride  75 mL/hr at 05/13/24 0329   cefTRIAXone  (ROCEPHIN )  IV 1 g (05/13/24 1003)   PRN Meds:.acetaminophen  **OR** acetaminophen , albuterol , HYDROcodone -acetaminophen , ondansetron  **OR** ondansetron  (ZOFRAN ) IV, mouth rinse  Family Communication: None at bedside  Disposition: Status is: Inpatient Remains inpatient appropriate because: Pending bronchoscopy     Time spent: 38 minutes  Length of inpatient stay: 6 days  Author: Carliss LELON Canales, DO 05/13/2024 12:38 PM  For on call review [email or web address].

## 2024-05-13 NOTE — Plan of Care (Signed)
  Problem: Clinical Measurements: Goal: Diagnostic test results will improve Outcome: Progressing   Problem: Education: Goal: Knowledge of General Education information will improve Description: Including pain rating scale, medication(s)/side effects and non-pharmacologic comfort measures Outcome: Not Progressing   Problem: Health Behavior/Discharge Planning: Goal: Ability to manage health-related needs will improve Outcome: Not Progressing   Problem: Clinical Measurements: Goal: Ability to maintain clinical measurements within normal limits will improve Outcome: Not Progressing Goal: Will remain free from infection Outcome: Not Progressing Goal: Respiratory complications will improve Outcome: Not Progressing Goal: Cardiovascular complication will be avoided Outcome: Not Progressing

## 2024-05-13 NOTE — Plan of Care (Signed)

## 2024-05-14 ENCOUNTER — Inpatient Hospital Stay (HOSPITAL_COMMUNITY): Payer: Self-pay | Admitting: Anesthesiology

## 2024-05-14 ENCOUNTER — Encounter (HOSPITAL_COMMUNITY): Payer: Self-pay | Admitting: Internal Medicine

## 2024-05-14 ENCOUNTER — Encounter (HOSPITAL_COMMUNITY)
Admission: EM | Disposition: A | Discharge status: Home / Self Care | Payer: Self-pay | Source: Home / Self Care | Attending: Internal Medicine

## 2024-05-14 DIAGNOSIS — I509 Heart failure, unspecified: Secondary | ICD-10-CM | POA: Diagnosis not present

## 2024-05-14 DIAGNOSIS — J189 Pneumonia, unspecified organism: Secondary | ICD-10-CM | POA: Diagnosis not present

## 2024-05-14 DIAGNOSIS — J9811 Atelectasis: Secondary | ICD-10-CM

## 2024-05-14 DIAGNOSIS — F17200 Nicotine dependence, unspecified, uncomplicated: Secondary | ICD-10-CM | POA: Diagnosis not present

## 2024-05-14 DIAGNOSIS — I2510 Atherosclerotic heart disease of native coronary artery without angina pectoris: Secondary | ICD-10-CM | POA: Diagnosis not present

## 2024-05-14 DIAGNOSIS — I130 Hypertensive heart and chronic kidney disease with heart failure and stage 1 through stage 4 chronic kidney disease, or unspecified chronic kidney disease: Secondary | ICD-10-CM | POA: Diagnosis not present

## 2024-05-14 DIAGNOSIS — N182 Chronic kidney disease, stage 2 (mild): Secondary | ICD-10-CM

## 2024-05-14 DIAGNOSIS — J441 Chronic obstructive pulmonary disease with (acute) exacerbation: Secondary | ICD-10-CM | POA: Diagnosis not present

## 2024-05-14 DIAGNOSIS — I480 Paroxysmal atrial fibrillation: Secondary | ICD-10-CM | POA: Diagnosis not present

## 2024-05-14 HISTORY — PX: VIDEO BRONCHOSCOPY: SHX5072

## 2024-05-14 LAB — CBC
HCT: 28.8 % — ABNORMAL LOW (ref 39.0–52.0)
Hemoglobin: 8.9 g/dL — ABNORMAL LOW (ref 13.0–17.0)
MCH: 30.7 pg (ref 26.0–34.0)
MCHC: 30.9 g/dL (ref 30.0–36.0)
MCV: 99.3 fL (ref 80.0–100.0)
Platelets: 105 K/uL — ABNORMAL LOW (ref 150–400)
RBC: 2.9 MIL/uL — ABNORMAL LOW (ref 4.22–5.81)
RDW: 14.9 % (ref 11.5–15.5)
WBC: 7.8 K/uL (ref 4.0–10.5)
nRBC: 0 % (ref 0.0–0.2)

## 2024-05-14 LAB — BASIC METABOLIC PANEL WITH GFR
Anion gap: 9 (ref 5–15)
BUN: 47 mg/dL — ABNORMAL HIGH (ref 8–23)
CO2: 25 mmol/L (ref 22–32)
Calcium: 8.8 mg/dL — ABNORMAL LOW (ref 8.9–10.3)
Chloride: 106 mmol/L (ref 98–111)
Creatinine, Ser: 2.11 mg/dL — ABNORMAL HIGH (ref 0.61–1.24)
GFR, Estimated: 31 mL/min — ABNORMAL LOW (ref >60)
Glucose, Bld: 121 mg/dL — ABNORMAL HIGH (ref 70–99)
Potassium: 3.8 mmol/L (ref 3.5–5.1)
Sodium: 141 mmol/L (ref 135–145)

## 2024-05-14 SURGERY — VIDEO BRONCHOSCOPY WITHOUT FLUORO
Anesthesia: General | Laterality: Right

## 2024-05-14 MED ORDER — CHLORHEXIDINE GLUCONATE 0.12 % MT SOLN
OROMUCOSAL | Status: AC
Start: 2024-05-14 — End: 2024-05-14
  Filled 2024-05-14: qty 15

## 2024-05-14 MED ORDER — LIDOCAINE 2% (20 MG/ML) 5 ML SYRINGE
INTRAMUSCULAR | Status: DC | PRN
  Administered 2024-05-14: 60 mg via INTRAVENOUS

## 2024-05-14 MED ORDER — NICOTINE 14 MG/24HR TD PT24: 14.0000 mg | MEDICATED_PATCH | Freq: Every day | TRANSDERMAL | 0 refills | Status: DC

## 2024-05-14 MED ORDER — SUCCINYLCHOLINE CHLORIDE 200 MG/10ML IV SOSY
PREFILLED_SYRINGE | INTRAVENOUS | Status: DC | PRN
  Administered 2024-05-14: 100 mg via INTRAVENOUS

## 2024-05-14 MED ORDER — ONDANSETRON HCL 4 MG/2ML IJ SOLN
INTRAMUSCULAR | Status: DC | PRN
  Administered 2024-05-14: 4 mg via INTRAVENOUS

## 2024-05-14 MED ORDER — EPHEDRINE SULFATE-NACL 50-0.9 MG/10ML-% IV SOSY
PREFILLED_SYRINGE | INTRAVENOUS | Status: DC | PRN
  Administered 2024-05-14: 5 mg via INTRAVENOUS

## 2024-05-14 MED ORDER — AMOXICILLIN-POT CLAVULANATE 500-125 MG PO TABS: 1.0000 | ORAL_TABLET | Freq: Two times a day (BID) | ORAL | 0 refills | Status: DC

## 2024-05-14 MED ORDER — GUAIFENESIN ER 600 MG PO TB12: 600.0000 mg | ORAL_TABLET | Freq: Two times a day (BID) | ORAL | 2 refills | Status: DC

## 2024-05-14 MED ORDER — FENTANYL CITRATE (PF) 100 MCG/2ML IJ SOLN
INTRAMUSCULAR | Status: AC
  Filled 2024-05-14: qty 2

## 2024-05-14 MED ORDER — PROPOFOL 10 MG/ML IV BOLUS
INTRAVENOUS | Status: DC | PRN
  Administered 2024-05-14: 100 mg via INTRAVENOUS
  Administered 2024-05-14: 50 mg via INTRAVENOUS
  Administered 2024-05-14: 40 mg via INTRAVENOUS

## 2024-05-14 MED ORDER — CHLORHEXIDINE GLUCONATE 0.12 % MT SOLN
15.0000 mL | Freq: Once | OROMUCOSAL | Status: AC
  Administered 2024-05-14: 15 mL via OROMUCOSAL

## 2024-05-14 MED ORDER — SODIUM CHLORIDE 0.9 % IV SOLN: INTRAVENOUS | Status: DC

## 2024-05-14 MED ORDER — DEXAMETHASONE SOD PHOSPHATE PF 10 MG/ML IJ SOLN
INTRAMUSCULAR | Status: DC | PRN
  Administered 2024-05-14: 10 mg via INTRAVENOUS

## 2024-05-14 MED ORDER — FENTANYL CITRATE (PF) 250 MCG/5ML IJ SOLN
INTRAMUSCULAR | Status: DC | PRN
  Administered 2024-05-14 (×2): 50 ug via INTRAVENOUS

## 2024-05-14 MED ORDER — PHENYLEPHRINE HCL-NACL 20-0.9 MG/250ML-% IV SOLN
INTRAVENOUS | Status: DC | PRN
  Administered 2024-05-14: 120 ug via INTRAVENOUS
  Administered 2024-05-14 (×2): 160 ug via INTRAVENOUS
  Administered 2024-05-14: 80 ug via INTRAVENOUS

## 2024-05-14 MED ORDER — AZITHROMYCIN 500 MG PO TABS: 500.0000 mg | ORAL_TABLET | Freq: Every day | ORAL | 0 refills | Status: AC

## 2024-05-14 NOTE — Op Note (Addendum)
  Name:  [name] MRN:  [id number] DOB:  [birth year]  PROCEDURE NOTE  Procedure(s): Flexible bronchoscopy [phone]) Brushing (68376) of the RUL Bronchial alveolar lavage (68375) of the RML/LL   Indications:  RML/RLL atelectasis, s/p RULobectomy  Consent:  Written informed consent was obtained prior to the procedure. The risks of the procedure including coughing, bleeding and the small chance of lung puncture requiring chest tube were discussed in great detail. The benefits & alternatives including serial follow up were also discussed. Plavix  was held x 4 days  Anesthesia:  General endotracheal.  Procedure summary:  Appropriate equipment was assembled.  The patient was  identified as [name]. Interim history obtained and brought to the operating room. Safety timeout was performed. The patient was placed supine on the operating table, airway established and general anesthesia administered by Anesthesia team.   After the appropriate level of anesthesia was assured, flexible video bronchoscope was lubricated and inserted through the endotracheal tube.    Airway examination was performed bilaterally to subsegmental level.  Thick brown mucoid secretions were suctioned out of RML/RLL, BAL obtained from these areas. On exam after ,mucosa appeared normal and no endobronchial lesions were identified.  Brushings obtained from RUL stump which appeared normal  See pics in media  After ensuring hemostasis , the bronchoscope was withdrawn.  The patient was extubated in endo room and transferred to PACU.   Specimens sent: Bronchial alveolar lavage specimen of the RML/RLL for  microbiology and cytology. Brushings of RUL stump for cytology  Complications:  No immediate complications were noted.  Hemodynamic parameters and oxygenation remained stable throughout the procedure.  Estimated blood loss:  None  Recommendation -patient can be discharged home with follow-up appointment  scheduled for 12/8.  BAL cultures can be followed then. Recommend discharge on antibiotics and airway clearance measures including Mucinex  and flutter valve   Harden Staff MD. FCCP. [facility] Pager [phone] If no response call [phone]   05/14/2024 8:36 AM

## 2024-05-14 NOTE — Anesthesia Preprocedure Evaluation (Addendum)
 Anesthesia Evaluation  Patient identified by MRN, date of birth, ID band Patient awake    Reviewed: Allergy & Precautions, NPO status , Patient's Chart, lab work & pertinent test results, reviewed documented beta blocker date and time   Airway Mallampati: III  TM Distance: >3 FB Neck ROM: Full    Dental  (+) Edentulous Upper, Edentulous Lower   Pulmonary COPD,  COPD inhaler, Current Smoker and Patient abstained from smoking. 41 pack year history Hx NSCLC s/p lobectomy (RUL) Acute on chronic hypoxic resp failure: home O2 requirements are 3LPM which he now back on (presented w/ SOB and had significantly higher O2 requirements for multiple days this admission) CT scan noting right upper lobe lung collapse likely due to postsurgical obstruction from previous right upper lobectomy, mucous plug, or endobronchial lesion.  Empiric antibiotics on board, nebulizers, mucolytics.    Pulmonary exam normal breath sounds clear to auscultation       Cardiovascular hypertension (128/78 preop), Pt. on medications and Pt. on home beta blockers pulmonary hypertension (mod pHTN on echo 04/2024)+ CAD, + Past MI, + Cardiac Stents (plavix ) and +CHF  DVT: LVEF 50-55% on most recent echo 04/2024.+ dysrhythmias (only taking plavix  for PVD, no other home A/C, rate controlled w/ metoprolol ) Atrial Fibrillation + Valvular Problems/Murmurs (mild MR, mild AI) MR and AI  Rhythm:Regular Rate:Normal  S/p EVAR 2024  Echo 04/23/24: 1. Left ventricular ejection fraction, by estimation, is 50 to 55%. The  left ventricle has low normal function. The left ventricle has no regional  wall motion abnormalities. There is mild left ventricular hypertrophy.  Left ventricular diastolic  parameters are indeterminate.   2. Right ventricular systolic function is normal. The right ventricular  size is mildly enlarged. There is moderately elevated pulmonary artery  systolic pressure.  The estimated right ventricular systolic pressure is  54.6 mmHg.   3. Left atrial size was severely dilated.   4. Right atrial size was moderately dilated.   5. The mitral valve is normal in structure. Mild mitral valve  regurgitation. No evidence of mitral stenosis.   6. Tricuspid valve regurgitation is moderate.   7. The aortic valve is normal in structure. Aortic valve regurgitation is  mild. No aortic stenosis is present.   8. The inferior vena cava is normal in size with greater than 50%  respiratory variability, suggesting right atrial pressure of 3 mmHg.    Stress test 2022 1.  Reduced apical counts with normal wall motion consistent with apical thinning artifact.  No evidence of ischemia or infarction. 2.  Normal LVEF for this modality, 47%. 3.  This is a low risk study.       Neuro/Psych negative neurological ROS  negative psych ROS   GI/Hepatic Neg liver ROS, hiatal hernia,GERD  Controlled,,Pancreatic insufficiency    Endo/Other  negative endocrine ROS    Renal/GU CRFRenal disease (cr 2.11)AKI on CKD- baseline Cr around 1.4, but 2 this admission Bladder dysfunction (chronic suprapubic catheter)      Musculoskeletal  (+) Arthritis , Osteoarthritis,    Abdominal   Peds negative pediatric ROS (+)  Hematology  (+) Blood dyscrasia, anemia Hb 8.9, plt 105   Anesthesia Other Findings   Reproductive/Obstetrics negative OB ROS                              Anesthesia Physical Anesthesia Plan  ASA: 4  Anesthesia Plan: General   Post-op Pain Management:  Induction: Intravenous  PONV Risk Score and Plan: Ondansetron , Treatment may vary due to age or medical condition and Dexamethasone   Airway Management Planned: Oral ETT  Additional Equipment: None  Intra-op Plan:   Post-operative Plan: Extubation in OR and Possible Post-op intubation/ventilation  Informed Consent: I have reviewed the patients History and Physical, chart,  labs and discussed the procedure including the risks, benefits and alternatives for the proposed anesthesia with the patient or authorized representative who has indicated his/her understanding and acceptance.     Dental advisory given  Plan Discussed with: CRNA  Anesthesia Plan Comments: (High risk of perioperative pulmonary complications given nature of pre-existing disease )         Anesthesia Quick Evaluation

## 2024-05-14 NOTE — Anesthesia Procedure Notes (Signed)
 Procedure Name: Intubation Date/Time: 05/14/2024 8:13 AM  Performed by: Delores Duwaine SAUNDERS, CRNAPre-anesthesia Checklist: Patient identified, Emergency Drugs available, Suction available and Patient being monitored Patient Re-evaluated:Patient Re-evaluated prior to induction Oxygen Delivery Method: Circle System Utilized Preoxygenation: Pre-oxygenation with 100% oxygen Induction Type: IV induction Ventilation: Mask ventilation without difficulty Laryngoscope Size: Mac and 4 Grade View: Grade I Tube type: Oral Tube size: 8.5 mm Number of attempts: 1 Airway Equipment and Method: Stylet and Oral airway Placement Confirmation: ETT inserted through vocal cords under direct vision, positive ETCO2 and breath sounds checked- equal and bilateral Secured at: 23 cm Tube secured with: Tape Dental Injury: Teeth and Oropharynx as per pre-operative assessment

## 2024-05-14 NOTE — Discharge Summary (Signed)
 Physician Discharge Summary   Patient: [name] MRN: [id number] DOB: [birth year]  Admit date:     05/07/2024  Discharge date: 05/14/24  Discharge Physician: Carliss LELON Canales   PCP: [name], DO   Recommendations at discharge:    Pt to be discharged home.   If you experience worsening fever, chills, chest pain, shortness of breath, or other concerning symptoms, please call your PCP or go to the emergency department immediately.  Discharge Diagnoses: Principal Problem:   Postobstructive pneumonia Active Problems:   Atrial fibrillation (HCC)   Tobacco use disorder   Acute kidney injury superimposed on CKD   Acute pulmonary edema (HCC)   Acute on chronic hypoxic respiratory failure (HCC)   Acute hypoxemic respiratory failure (HCC)   COPD exacerbation (HCC)   Atelectasis, right  Resolved Problems:   * No resolved hospital problems. *   Hospital Course:  78 year old male with history of stage I NSCLC, COPD, pancreatic insufficiency, chronic suprapubic catheter, coronary artery disease, hypertension, abdominal aortic aneurysm status post repair presented to the hospital for shortness of breath and wheezing.    Assessment and Plan:   Acute on chronic hypoxic respiratory failure - Tachypnea, dyspnea, presentation despite increase in O2.  Currently able to be weaned down to 3 L [place] which is patient's baseline.     Right upper lobe postobstructive pneumonia  - CT scan noting right upper lobe lung collapse likely due to postsurgical obstruction from previous right upper lobectomy, mucous plug, or endobronchial lesion.  Empiric antibiotics on board, nebulizers, mucolytics.  Evaluated by PCCM.  S/p bronchoscopy Friday 11/28 noting thick brown mucous plugging subsequently suctioned out of RML/RLL and BAL obtained.  No noted cancer observed.  Can resume previous medication regiment.  Will transition patient to p.o. azithromycin  plus Augmentin  (renally dosed) to take as directed  upon discharge.  Will also give prescription for Mucinex  and flutter valve.  Patient to follow-up with pulmonology 12/8 in the outpatient setting.  Acute exacerbation of chronic COPD - Exacerbated by above.  Appears to be showing improvement on current regimen.   History of stage 1B non-small cell lung cancer - Treated with right upper lobectomy in 2007.  No evidence of recurrence.  Biopsies and brushings from bronchoscopy 2023 were negative.  PET scan 2023 negative.   Chronic HFrEF - Last echo 11/7 noting EF 50-55%, BAE.  Appears euvolemic.  Entresto  metoprolol  on board.    Tobacco use disorder - Continues to smoke about half pack a day.  Encouraging cessation.  Nicotine  patch as needed and prescribed on discharge.   Acute kidney injury - Previous baseline creatinine around 1.4 last month.  Now appears to be hovering around 2.     Persistent atrial fibrillation/flutter - Appears to be rate controlled.  Normally on aspirin  and Plavix  for PAD.  Did not see anticoagulation.  Plavix  held for impending bronc.  Can resume Plavix  upon discharge.   Pancreatic insufficiency - On Creon    Chronic suprapubic catheter - Noted.   Consultants: PCCM Procedures performed: Bronchoscopy Disposition: Home Diet recommendation:  Discharge Diet Orders (From admission, onward)     Start     Ordered   05/14/24 0000  Diet - low sodium heart healthy        05/14/24 1035           Cardiac diet  DISCHARGE MEDICATION: Allergies as of 05/14/2024       Reactions   Candesartan Cilexetil-hctz Other (See Comments)   Acute renal  failure   Hydrochlorothiazide Other (See Comments)   Acute renal failure   Shellfish Allergy Other (See Comments)   Causes GOUT   Betadine [povidone Iodine ] Itching        Medication List     TAKE these medications    acetaminophen  500 MG tablet Commonly known as: TYLENOL  Take 500 mg by mouth every 6 (six) hours as needed for moderate pain (pain score 4-6).    albuterol  108 (90 Base) MCG/ACT inhaler Commonly known as: VENTOLIN  HFA Inhale 1-2 puffs into the lungs every 6 (six) hours as needed for wheezing or shortness of breath.   amLODipine  10 MG tablet Commonly known as: NORVASC  Take 1 tablet (10 mg total) by mouth daily.   amoxicillin -clavulanate 500-125 MG tablet Commonly known as: Augmentin Take 1 tablet by mouth 2 (two) times daily.   aspirin  EC 81 MG tablet Take 1 tablet (81 mg total) by mouth daily. Swallow whole.   azithromycin  500 MG tablet Commonly known as: ZITHROMAX  Take 1 tablet (500 mg total) by mouth daily for 5 days.   clopidogrel  75 MG tablet Commonly known as: PLAVIX  Take 1 tablet (75 mg total) by mouth daily.   escitalopram  5 MG tablet Commonly known as: LEXAPRO  TAKE 1 TABLET (5 MG TOTAL) BY MOUTH DAILY.   guaiFENesin  600 MG 12 hr tablet Commonly known as: Mucinex  Take 1 tablet (600 mg total) by mouth 2 (two) times daily.   HYDROcodone -acetaminophen  7.5-325 MG tablet Commonly known as: NORCO Take 1 tablet by mouth 2 (two) times daily as needed for moderate pain (pain score 4-6). Fill 30 days after last fill.   metoprolol  succinate 25 MG 24 hr tablet Commonly known as: TOPROL -XL Take 1 tablet (25 mg total) by mouth daily.   mirtazapine  7.5 MG tablet Commonly known as: REMERON  Take 2 tablets (15 mg total) by mouth at bedtime.   nicotine  14 mg/24hr patch Commonly known as: NICODERM CQ  - dosed in mg/24 hours Place 1 patch (14 mg total) onto the skin daily. Start taking on: May 15, 2024   pantoprazole  40 MG tablet Commonly known as: PROTONIX  TAKE 1 TABLET EVERY DAY FOR REFLUX   rosuvastatin  40 MG tablet Commonly known as: CRESTOR  Take 1 tablet (40 mg total) by mouth daily.   sacubitril -valsartan  97-103 MG Commonly known as: ENTRESTO  Take 1 tablet by mouth 2 (two) times daily.   senna 8.6 MG Tabs tablet Commonly known as: SENOKOT Take 1 tablet (8.6 mg total) by mouth in the morning and at  bedtime.   Stiolto Respimat  2.5-2.5 MCG/ACT Aers Generic drug: Tiotropium Bromide-Olodaterol Inhale 2 puffs into the lungs daily.   Zenpep  20000-63000 units Cpep Generic drug: Pancrelipase  (Lip-Prot-Amyl) Take 3 capsules by mouth 3 (three) times daily before meals. What changed:  when to take this additional instructions         Discharge Exam: Filed Weights   05/07/24 0235 05/14/24 0736  Weight: 63.8 kg 63.8 kg    GENERAL:  Alert, pleasant, no acute distress, frail HEENT:  EOMI, nasal cannula CARDIOVASCULAR: Irregularly irregular RESPIRATORY: Productive cough, poor air movement GASTROINTESTINAL: Suprapubic catheter, soft, nontender, nondistended EXTREMITIES:  No LE edema bilaterally NEURO:  No new focal deficits appreciated SKIN:  No rashes noted PSYCH:  Appropriate mood and affect    Condition at discharge: improving  The results of significant diagnostics from this hospitalization (including imaging, microbiology, ancillary and laboratory) are listed below for reference.   Imaging Studies: CT CHEST WO CONTRAST Result Date: 05/10/2024 CLINICAL DATA:  Shortness of breath. Lung cancer. Endobronchial lesion. Right upper lobe collapse on previous CT. EXAM: CT CHEST WITHOUT CONTRAST TECHNIQUE: Multidetector CT imaging of the chest was performed following the standard protocol without IV contrast. RADIATION DOSE REDUCTION: This exam was performed according to the departmental dose-optimization program which includes automated exposure control, adjustment of the mA and/or kV according to patient size and/or use of iterative reconstruction technique. COMPARISON:  05/07/2024 FINDINGS: Cardiovascular: The heart size is upper normal to borderline enlarged. Coronary artery calcification is evident. Advanced atherosclerotic calcification is noted in the wall of the thoracic aorta. Mediastinum/Nodes: No mediastinal lymphadenopathy. No evidence for gross hilar lymphadenopathy although  assessment is limited by the lack of intravenous contrast on the current study. The esophagus has normal imaging features. There is no axillary lymphadenopathy. Lungs/Pleura: Centrilobular and paraseptal emphysema evident. Volume loss in the right hemithorax is compatible with reported history of previous right upper lobectomy. Secretions/debris are seen in the right mainstem bronchus, increased in the interval with opacification of airways to the right middle and lower lobes. The interstitial and subtle ground-glass airspace opacity seen previously has improved in the interval. Small bilateral pleural effusions evident. Upper Abdomen: Status post abdominal aortic stent graft placement. Calcified gallstones noted. Cysts are evident in the right kidney, incompletely visualized. Bilateral nephrolithiasis. Musculoskeletal: No worrisome lytic or sclerotic osseous abnormality. IMPRESSION: 1. Interval increase in secretions/debris in the right mainstem bronchus with opacification of airways to the right middle and lower lobes. Interval aspiration since prior CT would be a consideration. 2. Sequelae of prior right upper lobectomy. 3. Interval improvement in the interstitial and subtle ground-glass airspace opacity seen previously. 4. Small bilateral pleural effusions. 5. Cholelithiasis. 6. Bilateral nephrolithiasis. 7. Aortic Atherosclerosis (ICD10-I70.0) and Emphysema (ICD10-J43.9). Electronically Signed   By: Camellia Candle M.D.   On: 05/10/2024 08:39   CT CHEST WO CONTRAST Result Date: 05/07/2024 EXAM: CT CHEST WITHOUT CONTRAST 05/07/2024 12:49:24 PM TECHNIQUE: CT of the chest was performed without the administration of intravenous contrast. Multiplanar reformatted images are provided for review. Automated exposure control, iterative reconstruction, and/or weight based adjustment of the mA/kV was utilized to reduce the radiation dose to as low as reasonably achievable. COMPARISON: 03/26/2024 CLINICAL HISTORY:  Diffuse/interstitial lung disease. FINDINGS: MEDIASTINUM: Heart and pericardium are unremarkable. The central airways are clear. Aortic atherosclerosis is again noted. LYMPH NODES: No mediastinal, hilar or axillary lymphadenopathy. LUNGS AND PLEURA: Emphysematous disease is again noted. There appears to be complete atelectasis of the right upper lobe, likely due to bronchial occlusion. Bronchoscopy is recommended. Mild bibasilar interstitial densities are noted, concerning for pulmonary edema. Minimal left pleural effusion is noted. No pneumothorax. SOFT TISSUES/BONES: No acute abnormality of the bones or soft tissues. UPPER ABDOMEN: Limited images of the upper abdomen demonstrate cholelithiasis and a right renal cyst. IMPRESSION: 1. Complete atelectasis of the right upper lobe, likely due to bronchial occlusion; bronchoscopy is recommended. 2. Mild bibasilar interstitial densities, concerning for pulmonary edema. 3. Minimal left pleural effusion. Electronically signed by: Lynwood Seip MD 05/07/2024 01:33 PM EST RP Workstation: [id number]   DG Chest Portable 1 View Result Date: 05/07/2024 EXAM: 1 VIEW(S) XRAY OF THE CHEST 05/07/2024 02:58:00 AM COMPARISON: 03/26/2024 CLINICAL HISTORY: sobetc FINDINGS: LINES, TUBES AND DEVICES: Partially imaged vascular stent in upper abdomen. LUNGS AND PLEURA: Volume loss in right lung with elevated right hemidiaphragm, stable since prior examination. Progressive opacification of the right hemithorax related to diffuse airspace infiltrate through the right hemithorax, likely asymmetric pulmonary edema. Small right pleural effusion. Trace  interstitial pulmonary edema noted throughout the left lung. No pneumothorax. No pleural effusion on the left. HEART AND MEDIASTINUM: Aortic arch atherosclerotic calcifications. Mediastinal shift to right. Cardiac size within normal limits. BONES AND SOFT TISSUES: No acute osseous abnormality. IMPRESSION: 1. Progressive opacification of the right  hemithorax related to diffuse airspace infiltrate, likely asymmetric pulmonary edema, with associated small pleural effusion . 2. Trace interstitial pulmonary edema in the left lung. 3. Volume loss in the right lung with elevated right hemidiaphragm, stable since prior examination. Electronically signed by: Dorethia Molt MD 05/07/2024 04:04 AM EST RP Workstation: [id number]   ECHOCARDIOGRAM COMPLETE Result Date: 04/23/2024    ECHOCARDIOGRAM REPORT   Patient Name:   [name] Date of Exam: 04/23/2024 Medical Rec #:  [id number]       Height:       72.0 in Accession #:    [id number]      Weight:       135.3 lb Date of Birth:  [birth year]       BSA:          1.805 m Patient Age:    78 years        BP:           144/60 mmHg Patient Gender: M               HR:           57 bpm. Exam Location:  [address] Procedure: 2D Echo, Cardiac Doppler and Color Doppler (Both Spectral and Color            Flow Doppler were utilized during procedure). Indications:    I50.22 Heart Failure with Reduced Ejection Fraction (HFrEF)  History:        Patient has prior history of Echocardiogram examinations, most                 recent 06/02/2023. CHF, CAD and Previous Myocardial Infarction,                 Abnormal ECG, Arrythmias:Atrial Fibrillation; Risk                 Factors:Hypertension, Dyslipidemia, Current Smoker and Family                 History of Coronary Artery Disease. HFrEF, AAA s/p Graft Repair,                 Chronic Renal Failure, History of Right Lung Cancer status post                 Lobectomy (2017).  Sonographer:    Heather Hawks RDCS Referring Phys: MADISON L FOUNTAIN IMPRESSIONS  1. Left ventricular ejection fraction, by estimation, is 50 to 55%. The left ventricle has low normal function. The left ventricle has no regional wall motion abnormalities. There is mild left ventricular hypertrophy. Left ventricular diastolic parameters are indeterminate.  2. Right ventricular systolic function is normal. The  right ventricular size is mildly enlarged. There is moderately elevated pulmonary artery systolic pressure. The estimated right ventricular systolic pressure is 54.6 mmHg.  3. Left atrial size was severely dilated.  4. Right atrial size was moderately dilated.  5. The mitral valve is normal in structure. Mild mitral valve regurgitation. No evidence of mitral stenosis.  6. Tricuspid valve regurgitation is moderate.  7. The aortic valve is normal in structure. Aortic valve regurgitation is mild. No aortic stenosis is present.  8. The inferior vena cava is  normal in size with greater than 50% respiratory variability, suggesting right atrial pressure of 3 mmHg. FINDINGS  Left Ventricle: Left ventricular ejection fraction, by estimation, is 50 to 55%. The left ventricle has low normal function. The left ventricle has no regional wall motion abnormalities. The left ventricular internal cavity size was normal in size. There is mild left ventricular hypertrophy. Left ventricular diastolic parameters are indeterminate. Right Ventricle: The right ventricular size is mildly enlarged. No increase in right ventricular wall thickness. Right ventricular systolic function is normal. There is moderately elevated pulmonary artery systolic pressure. The tricuspid regurgitant velocity is 3.59 m/s, and with an assumed right atrial pressure of 3 mmHg, the estimated right ventricular systolic pressure is 54.6 mmHg. Left Atrium: Left atrial size was severely dilated. Right Atrium: Right atrial size was moderately dilated. Pericardium: There is no evidence of pericardial effusion. Mitral Valve: The mitral valve is normal in structure. Mild mitral valve regurgitation. No evidence of mitral valve stenosis. Tricuspid Valve: The tricuspid valve is normal in structure. Tricuspid valve regurgitation is moderate . No evidence of tricuspid stenosis. Aortic Valve: The aortic valve is normal in structure. Aortic valve regurgitation is mild. No aortic  stenosis is present. Pulmonic Valve: The pulmonic valve was normal in structure. Pulmonic valve regurgitation is not visualized. No evidence of pulmonic stenosis. Aorta: The aortic root is normal in size and structure. Venous: The inferior vena cava is normal in size with greater than 50% respiratory variability, suggesting right atrial pressure of 3 mmHg. IAS/Shunts: No atrial level shunt detected by color flow Doppler.  LEFT VENTRICLE PLAX 2D LVIDd:         4.60 cm   Diastology LVIDs:         3.75 cm   LV e' medial:    10.30 cm/s LV PW:         1.20 cm   LV E/e' medial:  7.9 LV IVS:        1.20 cm   LV e' lateral:   11.45 cm/s LVOT diam:     2.60 cm   LV E/e' lateral: 7.1 LV SV:         96 LV SV Index:   53 LVOT Area:     5.31 cm LV IVRT:       100 msec  RIGHT VENTRICLE RV Basal diam:  4.30 cm     PULMONARY VEINS RV Mid diam:    3.10 cm     A Reversal Velocity: 46.10 cm/s RV S prime:     12.85 cm/s  Diastolic Velocity:  58.70 cm/s TAPSE (M-mode): 2.5 cm      S/D Velocity:        0.70 RVSP:           59.6 mmHg   Systolic Velocity:   39.90 cm/s LEFT ATRIUM              Index        RIGHT ATRIUM           Index LA diam:        4.10 cm  2.27 cm/m   RA Pressure: 8.00 mmHg LA Vol (A2C):   124.0 ml 68.72 ml/m  RA Area:     21.10 cm LA Vol (A4C):   96.9 ml  53.70 ml/m  RA Volume:   68.00 ml  37.68 ml/m LA Biplane Vol: 110.0 ml 60.96 ml/m  AORTIC VALVE LVOT Vmax:   90.48 cm/s LVOT Vmean:  55.850 cm/s LVOT  VTI:    0.182 m  AORTA Ao Root diam: 3.60 cm Ao Asc diam:  3.40 cm MITRAL VALVE               TRICUSPID VALVE MV Area (PHT)  cm         TR Peak grad:   51.6 mmHg MV Decel Time: 184 msec    TR Vmax:        359.00 cm/s MR Peak grad: 132.2 mmHg   Estimated RAP:  8.00 mmHg MR Mean grad: 77.5 mmHg    RVSP:           59.6 mmHg MR Vmax:      575.00 cm/s MR Vmean:     405.0 cm/s   SHUNTS MV E velocity: 81.25 cm/s  Systemic VTI:  0.18 m MV A velocity: 35.50 cm/s  Systemic Diam: 2.60 cm MV E/A ratio:  2.29 Oneil Parchment MD  Electronically signed by Oneil Parchment MD Signature Date/Time: 04/23/2024/3:05:55 PM    Final     Microbiology: Results for orders placed or performed during the hospital encounter of 05/07/24  Resp panel by RT-PCR (RSV, Flu A&B, Covid) Anterior Nasal Swab     Status: None   Collection Time: 05/07/24  2:53 AM   Specimen: Anterior Nasal Swab  Result Value Ref Range Status   SARS Coronavirus 2 by RT PCR NEGATIVE NEGATIVE Final    Comment: (NOTE) SARS-CoV-2 target nucleic acids are NOT DETECTED.  The SARS-CoV-2 RNA is generally detectable in upper respiratory specimens during the acute phase of infection. The lowest concentration of SARS-CoV-2 viral copies this assay can detect is 138 copies/mL. A negative result does not preclude SARS-Cov-2 infection and should not be used as the sole basis for treatment or other patient management decisions. A negative result may occur with  improper specimen collection/handling, submission of specimen other than nasopharyngeal swab, presence of viral mutation(s) within the areas targeted by this assay, and inadequate number of viral copies(<138 copies/mL). A negative result must be combined with clinical observations, patient history, and epidemiological information. The expected result is Negative.  Fact Sheet for Patients:  bloggercourse.com  Fact Sheet for Healthcare Providers:  seriousbroker.it  This test is no t yet approved or cleared by the [place]  FDA and  has been authorized for detection and/or diagnosis of SARS-CoV-2 by FDA under an Emergency Use Authorization (EUA). This EUA will remain  in effect (meaning this test can be used) for the duration of the COVID-19 declaration under Section 564(b)(1) of the Act, 21 U.S.C.section 360bbb-3(b)(1), unless the authorization is terminated  or revoked sooner.       Influenza A by PCR NEGATIVE NEGATIVE Final   Influenza B by PCR NEGATIVE  NEGATIVE Final    Comment: (NOTE) The Xpert Xpress SARS-CoV-2/FLU/RSV plus assay is intended as an aid in the diagnosis of influenza from Nasopharyngeal swab specimens and should not be used as a sole basis for treatment. Nasal washings and aspirates are unacceptable for Xpert Xpress SARS-CoV-2/FLU/RSV testing.  Fact Sheet for Patients: bloggercourse.com  Fact Sheet for Healthcare Providers: seriousbroker.it  This test is not yet approved or cleared by the [place]  FDA and has been authorized for detection and/or diagnosis of SARS-CoV-2 by FDA under an Emergency Use Authorization (EUA). This EUA will remain in effect (meaning this test can be used) for the duration of the COVID-19 declaration under Section 564(b)(1) of the Act, 21 U.S.C. section 360bbb-3(b)(1), unless the authorization is terminated or revoked.  Resp Syncytial Virus by PCR NEGATIVE NEGATIVE Final    Comment: (NOTE) Fact Sheet for Patients: bloggercourse.com  Fact Sheet for Healthcare Providers: seriousbroker.it  This test is not yet approved or cleared by the [place]  FDA and has been authorized for detection and/or diagnosis of SARS-CoV-2 by FDA under an Emergency Use Authorization (EUA). This EUA will remain in effect (meaning this test can be used) for the duration of the COVID-19 declaration under Section 564(b)(1) of the Act, 21 U.S.C. section 360bbb-3(b)(1), unless the authorization is terminated or revoked.  Performed at [facility], [address], [place], [place] [address]   MRSA Next Gen by PCR, Nasal     Status: None   Collection Time: 05/07/24  2:24 PM   Specimen: Nasal Mucosa; Nasal Swab  Result Value Ref Range Status   MRSA by PCR Next Gen NOT DETECTED NOT DETECTED Final    Comment: (NOTE) The GeneXpert MRSA Assay (FDA approved for NASAL specimens only), is  one component of a comprehensive MRSA colonization surveillance program. It is not intended to diagnose MRSA infection nor to guide or monitor treatment for MRSA infections. Test performance is not FDA approved in patients less than 6 years old. Performed at [facility], [address], [place], [place] [address]     Labs: CBC: Recent Labs  Lab 05/08/24 0216 05/10/24 0302 05/10/24 2114 05/13/24 0543 05/14/24 0518  WBC 11.7* 10.0  --  7.2 7.8  HGB 8.4* 8.2* 8.9* 8.0* 8.9*  HCT 27.0* 26.1* 28.8* 26.4* 28.8*  MCV 98.2 97.4  --  99.2 99.3  PLT 71* 84*  --  91* 105*   Basic Metabolic Panel: Recent Labs  Lab 05/08/24 0216 05/10/24 0302 05/12/24 0645 05/13/24 0543 05/14/24 0518  NA 137 136 139 139 141  K 3.6 3.7 3.7 3.5 3.8  CL 101 100 103 105 106  CO2 25 26 27 26 25   GLUCOSE 144* 124* 94 107* 121*  BUN 28* 41* 49* 49* 47*  CREATININE 2.04* 2.26* 2.17* 2.18* 2.11*  CALCIUM  8.5* 8.4* 8.3* 8.2* 8.8*   Liver Function Tests: No results for input(s): AST, ALT, ALKPHOS, BILITOT, PROT, ALBUMIN  in the last 168 hours. CBG: No results for input(s): GLUCAP in the last 168 hours.  Discharge time spent: 35 minutes.  Length of inpatient stay: 7 days  Signed: Carliss LELON Canales, DO [facility] 05/14/2024

## 2024-05-14 NOTE — Transfer of Care (Signed)
 Immediate Anesthesia Transfer of Care Note  Patient: [name]  Procedure(s) Performed: VIDEO BRONCHOSCOPY WITHOUT FLUORO (Right)  Patient Location: PACU  Anesthesia Type:General  Level of Consciousness: sedated  Airway & Oxygen Therapy: Patient Spontanous Breathing  Post-op Assessment: Report given to RN  Post vital signs: Reviewed and stable  Last Vitals:  Vitals Value Taken Time  BP 110/52 05/14/24 08:56  Temp    Pulse 59 05/14/24 09:00  Resp 15 05/14/24 09:00  SpO2 100 % 05/14/24 09:00  Vitals shown include unfiled device data.  Last Pain:  Vitals:   05/14/24 0736  TempSrc: Oral  PainSc: 0-No pain      Patients Stated Pain Goal: 0 (05/14/24 0736)  Complications: No notable events documented.

## 2024-05-14 NOTE — Plan of Care (Signed)
 Patient discharged home with no questions at this time    Problem: Education: Goal: Knowledge of General Education information will improve Description: Including pain rating scale, medication(s)/side effects and non-pharmacologic comfort measures Outcome: Progressing   Problem: Health Behavior/Discharge Planning: Goal: Ability to manage health-related needs will improve Outcome: Progressing   Problem: Clinical Measurements: Goal: Ability to maintain clinical measurements within normal limits will improve Outcome: Progressing Goal: Will remain free from infection Outcome: Progressing Goal: Diagnostic test results will improve Outcome: Progressing Goal: Respiratory complications will improve Outcome: Progressing Goal: Cardiovascular complication will be avoided Outcome: Progressing   Problem: Activity: Goal: Risk for activity intolerance will decrease Outcome: Progressing   Problem: Nutrition: Goal: Adequate nutrition will be maintained Outcome: Progressing   Problem: Coping: Goal: Level of anxiety will decrease Outcome: Progressing   Problem: Elimination: Goal: Will not experience complications related to bowel motility Outcome: Progressing Goal: Will not experience complications related to urinary retention Outcome: Progressing   Problem: Pain Managment: Goal: General experience of comfort will improve and/or be controlled Outcome: Progressing   Problem: Safety: Goal: Ability to remain free from injury will improve Outcome: Progressing   Problem: Skin Integrity: Goal: Risk for impaired skin integrity will decrease Outcome: Progressing

## 2024-05-14 NOTE — Interval H&P Note (Signed)
 History and Physical Interval Note:  05/14/2024 8:06 AM  [name] Her  has presented today for surgery, with the diagnosis of atelectasis.  The various methods of treatment have been discussed with the patient and family. After consideration of risks, benefits and other options for treatment, the patient has consented to  Procedure(s): VIDEO BRONCHOSCOPY WITHOUT FLUORO (Right) as a surgical intervention.  The patient's history has been reviewed, patient examined, no change in status, stable for surgery.  I have reviewed the patient's chart and labs.  Questions were answered to the patient's satisfaction.     Harden ROCKFORD Ashaya Raftery

## 2024-05-14 NOTE — Anesthesia Postprocedure Evaluation (Signed)
 Anesthesia Post Note  Patient: [name]  Procedure(s) Performed: VIDEO BRONCHOSCOPY WITHOUT FLUORO (Right)     Patient location during evaluation: PACU Anesthesia Type: General Level of consciousness: awake and alert, oriented and patient cooperative Pain management: pain level controlled Vital Signs Assessment: post-procedure vital signs reviewed and stable Respiratory status: spontaneous breathing, nonlabored ventilation and respiratory function stable Cardiovascular status: blood pressure returned to baseline and stable Postop Assessment: no apparent nausea or vomiting Anesthetic complications: no   No notable events documented.  Last Vitals:  Vitals:   05/14/24 0910 05/14/24 0920  BP: (!) 130/58 (!) 131/95  Pulse: 61 79  Resp: 17 19  Temp:    SpO2: 100% 100%    Last Pain:  Vitals:   05/14/24 0920  TempSrc:   PainSc: 0-No pain                 [name]

## 2024-05-15 ENCOUNTER — Encounter (HOSPITAL_COMMUNITY): Payer: Self-pay | Admitting: Pulmonary Disease

## 2024-05-16 LAB — ACID FAST SMEAR (AFB, MYCOBACTERIA)
Acid Fast Smear: NEGATIVE
Source (AFB): 183518

## 2024-05-17 ENCOUNTER — Telehealth: Payer: Self-pay

## 2024-05-17 NOTE — Patient Instructions (Signed)
 Visit Information  Thank you for taking time to visit with me today. Please don't hesitate to contact me if I can be of assistance to you before our next scheduled telephone appointment.  Our next appointment is by telephone on Tuesday December 9th at 10:00am  Following is a copy of your care plan:   Goals Addressed             This Visit's Progress    VBCI Transitions of Care (TOC) Care Plan       Problems:  Recent Hospitalization for treatment of COPD Hospital or Ed Adm Risk is 99%  Goal:  Over the next 30 days, the patient will not experience hospital readmission  Interventions:   COPD Interventions: Advised patient to track and manage COPD triggers Assessed social determinant of health barriers Discussed the importance of adequate rest and management of fatigue with COPD Provided education about and advised patient to utilize infection prevention strategies to reduce risk of respiratory infection Provided instruction about proper use of medications used for management of COPD including inhalers Use of home oxygen Encourage use of Nicotine  patient and smoking cessation Educated the patient on not using Nicotine  patch if he is smoking Cautioned oxygen and flammatory objects like cigarettes and oxygen  Patient Self Care Activities:  Attend all scheduled provider appointments Call pharmacy for medication refills 3-7 days in advance of running out of medications Call provider office for new concerns or questions  Notify RN Care Manager of TOC call rescheduling needs Participate in Transition of Care Program/Attend TOC scheduled calls Perform all self care activities independently  Take medications as prescribed    Plan:  Telephone follow up appointment with care management team member scheduled for:  Tuesday December 9th at 10:00am        The patient verbalized understanding of instructions, educational materials, and care plan provided today and agreed to receive a  mailed copy of patient instructions, educational materials, and care plan.   The patient has been provided with contact information for the care management team and has been advised to call with any health related questions or concerns.   Please call the care guide team at [phone] if you need to cancel or reschedule your appointment.   Please call the Suicide and Crisis Lifeline: 988 call the [place]  National Suicide Prevention Lifeline: (516)852-2781 or TTY: 332-692-7761 TTY 952-052-8187) to talk to a trained counselor if you are experiencing a Mental Health or Behavioral Health Crisis or need someone to talk to.  Medford Balboa, BSN, RN [place]  VBCI - [employer] [facility] RN Care Manager [phone]

## 2024-05-17 NOTE — Transitions of Care (Post Inpatient/ED Visit) (Signed)
 05/17/2024  Name: [name] MRN: [id number] DOB: 22-May-1946  Today's TOC FU Call Status: Today's TOC FU Call Status:: Successful TOC FU Call Completed TOC FU Call Complete Date: 05/17/24  Patient's Name and Date of Birth confirmed. Name, DOB  Transition Care Management Follow-up Telephone Call Date of Discharge: 05/14/24 Discharge Facility: [facility]) Type of Discharge: Inpatient Admission Primary Inpatient Discharge Diagnosis:: Pneumonia, COPD Exacerbation How have you been since you were released from the hospital?: Better Any questions or concerns?: No  Items Reviewed: Did you receive and understand the discharge instructions provided?: Yes Medications obtained,verified, and reconciled?: Yes (Medications Reviewed) Any new allergies since your discharge?: No Dietary orders reviewed?: Yes Type of Diet Ordered:: Low sodium Heart Healthy Do you have support at home?: Yes People in Home [RPT]: alone Name of Support/Comfort Primary Source: Mazie Finder  Medications Reviewed Today: Medications Reviewed Today     Reviewed by Moises Reusing, RN (Case Manager) on 05/17/24 at 1050  Med List Status: <None>   Medication Order Taking? Sig Documenting Provider Last Dose Status Informant  acetaminophen  (TYLENOL ) 500 MG tablet [id number]  Take 500 mg by mouth every 6 (six) hours as needed for moderate pain (pain score 4-6). [provider]  Active Self, Pharmacy Records  albuterol  (VENTOLIN  HFA) 108 (90 Base) MCG/ACT inhaler [id number]  Inhale 1-2 puffs into the lungs every 6 (six) hours as needed for wheezing or shortness of breath. [name], DO  Active Self, Pharmacy Records  amLODipine  (NORVASC ) 10 MG tablet [id number]  Take 1 tablet (10 mg total) by mouth daily. [name], DO  Active Self, Pharmacy Records  amoxicillin -clavulanate (AUGMENTIN) 500-125 MG tablet [id number]  Take 1 tablet by mouth 2 (two) times daily. Arlon Carliss ORN, DO  Active   aspirin   EC 81 MG tablet [id number]  Take 1 tablet (81 mg total) by mouth daily. Swallow whole. [name] ORN, MD  Active Self, Pharmacy Records  azithromycin  (ZITHROMAX ) 500 MG tablet [id number]  Take 1 tablet (500 mg total) by mouth daily for 5 days. Arlon Carliss ORN, DO  Active   clopidogrel  (PLAVIX ) 75 MG tablet [id number]  Take 1 tablet (75 mg total) by mouth daily. Bethanie Cough, PA-C  Active Self, Pharmacy Records  escitalopram  (LEXAPRO ) 5 MG tablet [id number]  TAKE 1 TABLET (5 MG TOTAL) BY MOUTH DAILY. [name], DO  Active Self, Pharmacy Records  guaiFENesin  (MUCINEX ) 600 MG 12 hr tablet [id number]  Take 1 tablet (600 mg total) by mouth 2 (two) times daily.  Patient not taking: Reported on 05/17/2024   Arlon Carliss ORN, DO  Active   HYDROcodone -acetaminophen  (NORCO) 7.5-325 MG tablet [id number]  Take 1 tablet by mouth 2 (two) times daily as needed for moderate pain (pain score 4-6). Fill 30 days after last fill. [name], DO  Active Self, Pharmacy Records  metoprolol  succinate (TOPROL -XL) 25 MG 24 hr tablet [id number]  Take 1 tablet (25 mg total) by mouth daily. [name], DO  Active Self, Pharmacy Records  mirtazapine  (REMERON ) 7.5 MG tablet [id number]  Take 2 tablets (15 mg total) by mouth at bedtime. [name], DO  Active Self, Pharmacy Records  nicotine  (NICODERM CQ  - DOSED IN MG/24 HOURS) 14 mg/24hr patch [id number]  Place 1 patch (14 mg total) onto the skin daily.  Patient not taking: Reported on 05/17/2024   Arlon Carliss ORN, DO  Active   Pancrelipase , Lip-Prot-Amyl, (ZENPEP ) 20000-63000 units CPEP [id number]  Take 3 capsules by  mouth 3 (three) times daily before meals.  Patient taking differently: Take 3 capsules by mouth See admin instructions. 3 caps in the morning, 2 caps afternoon and 3 caps in the evening   [name], DO  Active Self, Pharmacy Records  pantoprazole  (PROTONIX ) 40 MG tablet [id number]  TAKE 1 TABLET EVERY DAY FOR REFLUX [name], DO  Active  Self, Pharmacy Records  rosuvastatin  (CRESTOR ) 40 MG tablet [id number]  Take 1 tablet (40 mg total) by mouth daily. Jerrell Cleatus Ned, MD  Active Self, Pharmacy Records  sacubitril -valsartan  (ENTRESTO ) 97-103 MG [id number]  Take 1 tablet by mouth 2 (two) times daily. [name], DO  Active Self, Pharmacy Records  senna (SENOKOT) 8.6 MG TABS tablet [id number]  Take 1 tablet (8.6 mg total) by mouth in the morning and at bedtime. Napoleon Limes, MD  Active Self, Pharmacy Records  Tiotropium Bromide-Olodaterol (STIOLTO RESPIMAT ) 2.5-2.5 MCG/ACT AERS [id number]  Inhale 2 puffs into the lungs daily. Zaida Zola SAILOR, MD  Active Self, Pharmacy Records            Home Care and Equipment/Supplies: Were Home Health Services Ordered?: NA Any new equipment or medical supplies ordered?: NA  Functional Questionnaire: Do you need assistance with bathing/showering or dressing?: No Do you need assistance with meal preparation?: No Do you need assistance with eating?: No Do you have difficulty maintaining continence: No Do you need assistance with getting out of bed/getting out of a chair/moving?: No Do you have difficulty managing or taking your medications?: No  Follow up appointments reviewed: PCP Follow-up appointment confirmed?: No (Message sent to [facility] to schedule) MD Provider Line Number:[phone] Given: No Specialist Hospital Follow-up appointment confirmed?: Yes Date of Specialist follow-up appointment?: 05/24/24 Follow-Up Specialty Provider:: Dr. Zaida Do you need transportation to your follow-up appointment?: No (Uses [facility]) Do you understand care options if your condition(s) worsen?: Yes-patient verbalized understanding  SDOH Interventions Today    Flowsheet Row Most Recent Value  SDOH Interventions   Food Insecurity Interventions Intervention Not Indicated  Housing Interventions Intervention Not Indicated   Transportation Interventions Intervention Not Indicated  Utilities Interventions Intervention Not Indicated     Goals Addressed             This Visit's Progress    VBCI Transitions of Care (TOC) Care Plan       Problems:  Recent Hospitalization for treatment of COPD Hospital or Ed Adm Risk is 99%  Goal:  Over the next 30 days, the patient will not experience hospital readmission  Interventions:   COPD Interventions: Advised patient to track and manage COPD triggers Assessed social determinant of health barriers Discussed the importance of adequate rest and management of fatigue with COPD Provided education about and advised patient to utilize infection prevention strategies to reduce risk of respiratory infection Provided instruction about proper use of medications used for management of COPD including inhalers Use of home oxygen Encourage use of Nicotine  patient and smoking cessation Educated the patient on not using Nicotine  patch if he is smoking Cautioned oxygen and flammatory objects like cigarettes and oxygen  Patient Self Care Activities:  Attend all scheduled provider appointments Call pharmacy for medication refills 3-7 days in advance of running out of medications Call provider office for new concerns or questions  Notify RN Care Manager of TOC call rescheduling needs Participate in Transition of Care Program/Attend TOC scheduled calls Perform all self care activities independently  Take medications as prescribed  Plan:  Telephone follow up appointment with care management team member scheduled for:  Tuesday December 9th at 10:00am        Medford Balboa, BSN, RN [place]  VBCI - [facility] RN Care Manager [phone]

## 2024-05-17 NOTE — Telephone Encounter (Signed)
-----   Message from [facility] sent at 05/17/2024 11:10 AM EST -----  S - Patient was recently discharged on Friday November 28th from [facility]   .We are notifying you that the patient has been enrolled in our Transitions of Care (TOC) program to support their post-discharge recovery and reduce the risk of readmission.  B - The patient was admitted for  Pneumonia and COPD Exacerbation   .The [facility] program includes follow-up calls, medication reconciliation, and coordination of follow-up appointments.  A - The patient is currently stable. Our team will monitor their progress and provide support as needed.  R - Please be aware of the patient's enrollment in the [facility] program. If you have any questions or need additional information, feel free to contact me.   Please schedule a Hospital Follow up appt.  Medford Balboa, BSN, RN [place]  VBCI - [employer] [facility] RN Care Manager [phone]

## 2024-05-17 NOTE — Telephone Encounter (Signed)
 HFU has been scheduled.

## 2024-05-18 LAB — CYTOLOGY - NON PAP

## 2024-05-19 LAB — AEROBIC/ANAEROBIC CULTURE W GRAM STAIN (SURGICAL/DEEP WOUND)

## 2024-05-21 ENCOUNTER — Other Ambulatory Visit: Payer: Self-pay | Admitting: Internal Medicine

## 2024-05-21 DIAGNOSIS — Z79891 Long term (current) use of opiate analgesic: Secondary | ICD-10-CM

## 2024-05-21 DIAGNOSIS — M25551 Pain in right hip: Secondary | ICD-10-CM

## 2024-05-21 MED ORDER — HYDROCODONE-ACETAMINOPHEN 7.5-325 MG PO TABS: 1.0000 | ORAL_TABLET | Freq: Two times a day (BID) | ORAL | 0 refills | Status: DC | PRN

## 2024-05-21 MED ORDER — ZENPEP 20000-63000 UNITS PO CPEP: 3.0000 | ORAL_CAPSULE | Freq: Three times a day (TID) | ORAL | 0 refills | Status: DC

## 2024-05-21 NOTE — Telephone Encounter (Signed)
 Copied from CRM #[id number]. Topic: Clinical - Medication Refill >> May 21, 2024  8:34 AM Graeme ORN wrote: Medication:  Pancrelipase , Lip-Prot-Amyl, (ZENPEP ) 20000-63000 units CPEP HYDROcodone -acetaminophen  (NORCO) 7.5-325 MG tablet  Has the patient contacted their pharmacy? No (Agent: If no, request that the patient contact the pharmacy for the refill. If patient does not wish to contact the pharmacy document the reason why and proceed with request.) (Agent: If yes, when and what did the pharmacy advise?)  This is the patient's preferred pharmacy:  [id number] - [place], [place] - [address] AT [facility] [address] [place] [address] Phone: [phone] Fax: [phone]  Is this the correct pharmacy for this prescription? Yes If no, delete pharmacy and type the correct one.   Has the prescription been filled recently? Yes  Is the patient out of the medication? No - couple days left  Has the patient been seen for an appointment in the last year OR does the patient have an upcoming appointment? Yes  Can we respond through MyChart? No  Agent: Please be advised that Rx refills may take up to 3 business days. We ask that you follow-up with your pharmacy.

## 2024-05-21 NOTE — Telephone Encounter (Signed)
 Last OV - 04/26/24 with PCP. Next OV - 12/12 with Dr Norrine. TOX - 08/04/23.

## 2024-05-24 ENCOUNTER — Encounter

## 2024-05-24 ENCOUNTER — Emergency Department (HOSPITAL_COMMUNITY)

## 2024-05-24 ENCOUNTER — Encounter (HOSPITAL_COMMUNITY): Payer: Self-pay | Admitting: *Deleted

## 2024-05-24 ENCOUNTER — Ambulatory Visit

## 2024-05-24 ENCOUNTER — Emergency Department (HOSPITAL_COMMUNITY)
Admission: EM | Admit: 2024-05-24 | Discharge: 2024-05-24 | Disposition: A | Discharge status: Home / Self Care | Attending: Emergency Medicine | Admitting: Emergency Medicine

## 2024-05-24 ENCOUNTER — Ambulatory Visit: Admitting: Emergency Medicine

## 2024-05-24 ENCOUNTER — Other Ambulatory Visit: Payer: Self-pay

## 2024-05-24 DIAGNOSIS — R0602 Shortness of breath: Secondary | ICD-10-CM

## 2024-05-24 DIAGNOSIS — J441 Chronic obstructive pulmonary disease with (acute) exacerbation: Secondary | ICD-10-CM

## 2024-05-24 LAB — CBC WITH DIFFERENTIAL/PLATELET
Abs Immature Granulocytes: 0.01 K/uL (ref 0.00–0.07)
Basophils Absolute: 0 K/uL (ref 0.0–0.1)
Basophils Relative: 0 %
Eosinophils Absolute: 0.1 K/uL (ref 0.0–0.5)
Eosinophils Relative: 1 %
HCT: 27.6 % — ABNORMAL LOW (ref 39.0–52.0)
Hemoglobin: 8.1 g/dL — ABNORMAL LOW (ref 13.0–17.0)
Immature Granulocytes: 0 %
Lymphocytes Relative: 11 %
Lymphs Abs: 0.7 K/uL (ref 0.7–4.0)
MCH: 30.5 pg (ref 26.0–34.0)
MCHC: 29.3 g/dL — ABNORMAL LOW (ref 30.0–36.0)
MCV: 103.8 fL — ABNORMAL HIGH (ref 80.0–100.0)
Monocytes Absolute: 0.2 K/uL (ref 0.1–1.0)
Monocytes Relative: 3 %
Neutro Abs: 5 K/uL (ref 1.7–7.7)
Neutrophils Relative %: 85 %
Platelets: 81 K/uL — ABNORMAL LOW (ref 150–400)
RBC: 2.66 MIL/uL — ABNORMAL LOW (ref 4.22–5.81)
RDW: 17 % — ABNORMAL HIGH (ref 11.5–15.5)
Smear Review: NORMAL
WBC: 5.9 K/uL (ref 4.0–10.5)
nRBC: 0 % (ref 0.0–0.2)

## 2024-05-24 LAB — TROPONIN T, HIGH SENSITIVITY: Troponin T High Sensitivity: 77 ng/L — ABNORMAL HIGH (ref 0–19)

## 2024-05-24 LAB — BASIC METABOLIC PANEL WITH GFR
Anion gap: 11 (ref 5–15)
BUN: 18 mg/dL (ref 8–23)
CO2: 24 mmol/L (ref 22–32)
Calcium: 8.8 mg/dL — ABNORMAL LOW (ref 8.9–10.3)
Chloride: 110 mmol/L (ref 98–111)
Creatinine, Ser: 1.5 mg/dL — ABNORMAL HIGH (ref 0.61–1.24)
GFR, Estimated: 47 mL/min — ABNORMAL LOW (ref >60)
Glucose, Bld: 87 mg/dL (ref 70–99)
Potassium: 3.8 mmol/L (ref 3.5–5.1)
Sodium: 144 mmol/L (ref 135–145)

## 2024-05-24 LAB — PRO BRAIN NATRIURETIC PEPTIDE: Pro Brain Natriuretic Peptide: 14957 pg/mL — ABNORMAL HIGH (ref <300.0)

## 2024-05-24 MED ORDER — FUROSEMIDE 40 MG PO TABS: 40.0000 mg | ORAL_TABLET | Freq: Every day | ORAL | 0 refills | Status: DC

## 2024-05-24 NOTE — ED Notes (Addendum)
 Pt eating breakfast sausage and juice. Ok to eat per MD

## 2024-05-24 NOTE — ED Triage Notes (Signed)
 Pt arrives via EMS from home with complaints of shortness of breath upon waking up this morning. Pt reports a hx of COPD, AFIB, CHF, and Asthma. PT reports feeling better after EMS Neb Tx.   20ga LEFT AC 125mg  Solumedrol given 5mg  Albuterol /0.5mg  Atrovent  Neb

## 2024-05-24 NOTE — Discharge Instructions (Addendum)
 While you were in the emergency room, you had blood work done that was at your normal level.  Your chest x-ray did not show any changes from old ones.  Continue to take all medications as prescribed.  Please call your pulmonologist to reschedule your appointment.  Return to the emergency room if you develop worsening pain in your chest or increased difficulty with your breathing.  You can take 40 of oral lasix  each day for the next 1 week. Call your PCP tomorrow.

## 2024-05-24 NOTE — Progress Notes (Signed)
 Pt is active c/[employer] oxygen. He received a full set-up 03/30/2024.

## 2024-05-24 NOTE — ED Notes (Signed)
 PTAR CALLED @ 15:20  ,KLJ

## 2024-05-24 NOTE — ED Provider Notes (Signed)
  EMERGENCY DEPARTMENT AT [facility] Provider Note  CSN: [id number] Arrival date & time: 05/24/24 9047  Chief Complaint(s) Shortness of Breath  HPI [name] is a 78 y.o. male COPD, lung cancer status post resection not on any active treatment, atrial fibrillation, CHF.  Patient felt short of breath upon waking this morning, received breathing treatment with EMS with improvement.  Patient recently admitted for pneumonia.  Patient was most have a follow-up appointment with his pulmonologist today.  He states that since receiving meds from EMS, his symptoms have resolved.   Past Medical History Past Medical History:  Diagnosis Date   AAA (abdominal aortic aneurysm)    followed by vasc surgery   Alcohol abuse    Barretts esophagus    bx distal esophagus 2011 neg    Bladder outlet obstruction 04/02/2018   CAD in native artery    a. s/p stent to prox RCA and mid Cx.   Cataract    Cholelithiasis    Chronic osteomyelitis involving ankle and foot, right (HCC) 07/13/2018   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   2006 - 3 adenomas largest 12 mm  2008 - no polyps 2013-  2 mm cecal polyp, adenoma, routine repeat colonoscopy 02/2017 approx 05/14/2017 18 polyps max 12 mm ALL adenomas recall 2019 [name], MD, [facility]      Dieulafoy lesion of duodenum    Diverticulosis of colon    DJD (degenerative joint disease)    left hip   Full dentures    upper and lower   GERD (gastroesophageal reflux disease)    diet controlled    Gout    feet   Hiatal hernia    2 cm noted on upper endos 2011    Hx of adenomatous colonic polyps    Hyperlipidemia    Hypertension    Internal hemorrhoid    Lung cancer (HCC)    squamous cell (T2N0MX), 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 pt was on Plavix  x 3 years then transitioned to ASA, no problems since   Non-small cell carcinoma of right lung, stage 1 (HCC)  01/30/2016   Renal failure, acute    hx of 2/2 medication, resolved   Tobacco abuse    smoker .5 ppd x 55 yrs   UTI (lower urinary tract infection)    Patient Active Problem List   Diagnosis Date Noted   Atelectasis, right 05/14/2024   Postobstructive pneumonia 05/12/2024   COPD exacerbation (HCC) 05/12/2024   Acute hypoxemic respiratory failure (HCC) 05/07/2024   Acute on chronic hypoxic respiratory failure (HCC) 03/26/2024   Suprapubic catheter (HCC) 12/22/2023   Thrombocytopenia 12/22/2023   Chronic congestive heart failure (HCC) 06/02/2023   Acute pulmonary edema (HCC) 06/01/2023   S/P AAA repair 08/16/2022   Emphysema lung (HCC) 03/05/2022   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   Anemia    Atrial fibrillation (HCC) 03/05/2021   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   Mild protein-calorie malnutrition 11/07/2020   Acute kidney injury superimposed on CKD 11/06/2020   Urinary retention 04/02/2018   Mild alcohol use disorder 12/09/2016   Premature atrial contractions 07/31/2015   Chronic use of opiate for therapeutic purpose 05/01/2015   Atherosclerosis of aorta (HCC) 01/13/2015   Prediabetes 12/30/2014   AAA (abdominal aortic aneurysm)    Healthcare maintenance 06/30/2012   ASCVD (arteriosclerotic cardiovascular disease) 03/17/2012  Erectile dysfunction 10/12/2010   Arthropathy of pelvic region and thigh 05/02/2008   CKD (chronic kidney disease) stage 2, GFR 60-89 ml/min 08/07/2007   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   Gout 04/18/2006   Tobacco use disorder 04/18/2006   Essential hypertension 04/18/2006   GERD 04/18/2006   Home Medication(s) Prior to Admission medications   Medication Sig Start Date End Date Taking? Authorizing Provider  acetaminophen  (TYLENOL ) 500 MG tablet Take 500 mg by mouth every 6 (six) hours as needed for moderate pain (pain score 4-6).    [provider]  albuterol  (VENTOLIN   HFA) 108 (90 Base) MCG/ACT inhaler Inhale 1-2 puffs into the lungs every 6 (six) hours as needed for wheezing or shortness of breath. 12/23/23   [name], DO  amLODipine  (NORVASC ) 10 MG tablet Take 1 tablet (10 mg total) by mouth daily. 12/09/23 12/08/24  [name], DO  amoxicillin -clavulanate (AUGMENTIN ) 500-125 MG tablet Take 1 tablet by mouth 2 (two) times daily. 05/14/24   Arlon Carliss ORN, DO  aspirin  EC 81 MG tablet Take 1 tablet (81 mg total) by mouth daily. Swallow whole. 04/14/23   [name] ORN, MD  clopidogrel  (PLAVIX ) 75 MG tablet Take 1 tablet (75 mg total) by mouth daily. 12/11/23   Bethanie Cough, PA-C  escitalopram  (LEXAPRO ) 5 MG tablet TAKE 1 TABLET (5 MG TOTAL) BY MOUTH DAILY. 12/09/23 06/06/24  [name], DO  guaiFENesin  (MUCINEX ) 600 MG 12 hr tablet Take 1 tablet (600 mg total) by mouth 2 (two) times daily. Patient not taking: Reported on 05/17/2024 05/14/24 05/14/25  Arlon Carliss ORN, DO  HYDROcodone -acetaminophen  (NORCO) 7.5-325 MG tablet Take 1 tablet by mouth 2 (two) times daily as needed for moderate pain (pain score 4-6). Fill 30 days after last fill. 05/21/24   [name], DO  metoprolol  succinate (TOPROL -XL) 25 MG 24 hr tablet Take 1 tablet (25 mg total) by mouth daily. 03/26/24 03/26/25  [name], DO  mirtazapine  (REMERON ) 7.5 MG tablet Take 2 tablets (15 mg total) by mouth at bedtime. 03/24/24   [name], DO  nicotine  (NICODERM CQ  - DOSED IN MG/24 HOURS) 14 mg/24hr patch Place 1 patch (14 mg total) onto the skin daily. Patient not taking: Reported on 05/17/2024 05/15/24   Arlon Carliss ORN, DO  Pancrelipase , Lip-Prot-Amyl, (ZENPEP ) 20000-63000 units CPEP Take 3 capsules by mouth 3 (three) times daily before meals. 05/21/24   [name], DO  pantoprazole  (PROTONIX ) 40 MG tablet TAKE 1 TABLET EVERY DAY FOR REFLUX 12/09/23   [name], DO  rosuvastatin  (CRESTOR ) 40 MG tablet Take 1 tablet (40 mg total) by mouth daily. 04/03/23    Jerrell Cleatus Ned, MD  sacubitril -valsartan  (ENTRESTO ) 97-103 MG Take 1 tablet by mouth 2 (two) times daily. 04/19/24   [name], DO  senna (SENOKOT) 8.6 MG TABS tablet Take 1 tablet (8.6 mg total) by mouth in the morning and at bedtime. 03/27/24   Smucker, Nathanael, MD  Tiotropium Bromide-Olodaterol (STIOLTO RESPIMAT ) 2.5-2.5 MCG/ACT AERS Inhale 2 puffs into the lungs daily. 02/27/24   Hattar, Zola SAILOR, MD  Past Surgical History Past Surgical History:  Procedure Laterality Date   ABDOMINAL AORTIC ENDOVASCULAR FENESTRATED STENT GRAFT N/A 08/16/2022   Procedure: ABDOMINAL AORTIC ENDOVASCULAR FENESTRATED STENT GRAFT;  Surgeon: [name] ORN, MD;  Location: [facility] OR;  Service: Vascular;  Laterality: N/A;   ABDOMINAL AORTIC ENDOVASCULAR FENESTRATED STENT GRAFT N/A 12/02/2023   Procedure: ABDOMINAL AORTIC ENDOVASCULAR FENESTRATED STENT GRAFT WITH ULTRASOUND GUIDED ACCESS;  Surgeon: [name] ORN, MD;  Location: [facility] INVASIVE CV LAB;  Service: Cardiovascular;  Laterality: N/A;   BIOPSY  11/12/2020   Procedure: BIOPSY;  Surgeon: Legrand Victory LITTIE DOUGLAS, MD;  Location: [facility] ENDOSCOPY;  Service: Gastroenterology;;   BIOPSY  07/25/2021   Procedure: BIOPSY;  Surgeon: Stacia Glendia BRAVO, MD;  Location: [facility] ENDOSCOPY;  Service: Gastroenterology;;   BIOPSY  12/24/2021   Procedure: BIOPSY;  Surgeon: Wilhelmenia Aloha Raddle., MD;  Location: [facility] ENDOSCOPY;  Service: Gastroenterology;;   BRONCHIAL BIOPSY  07/27/2021   Procedure: BRONCHIAL BIOPSIES;  Surgeon: Shelah Lamar RAMAN, MD;  Location: [facility] ENDOSCOPY;  Service: Cardiopulmonary;;   BRONCHIAL BRUSHINGS  07/27/2021   Procedure: BRONCHIAL BRUSHINGS;  Surgeon: Shelah Lamar RAMAN, MD;  Location: [facility] ENDOSCOPY;  Service: Cardiopulmonary;;   cataract surgery Bilateral 2018   removal of cataracts   COLONOSCOPY     hx polyps - Avram 02/2012    COLONOSCOPY WITH PROPOFOL  N/A 11/12/2020   Procedure: COLONOSCOPY WITH PROPOFOL ;  Surgeon: Legrand Victory LITTIE DOUGLAS, MD;  Location: [facility] ENDOSCOPY;  Service: Gastroenterology;  Laterality: N/A;   CORONARY ANGIOPLASTY WITH STENT PLACEMENT     ENDOVASCULAR STENT INSERTION Bilateral 08/16/2022   Procedure: ENDOVASCULAR STENT GRAFT INSERTION OF BILATERAL RENAL ARTERIES;  Surgeon: [name] ORN, MD;  Location: [facility] OR;  Service: Vascular;  Laterality: Bilateral;   ESOPHAGOGASTRODUODENOSCOPY (EGD) WITH PROPOFOL  N/A 11/12/2020   Procedure: ESOPHAGOGASTRODUODENOSCOPY (EGD) WITH PROPOFOL ;  Surgeon: Legrand Victory LITTIE DOUGLAS, MD;  Location: [facility] ENDOSCOPY;  Service: Gastroenterology;  Laterality: N/A;   ESOPHAGOGASTRODUODENOSCOPY (EGD) WITH PROPOFOL  N/A 03/06/2021   Procedure: ESOPHAGOGASTRODUODENOSCOPY (EGD) WITH PROPOFOL ;  Surgeon: Leigh Elspeth SQUIBB, MD;  Location: [facility] ENDOSCOPY;  Service: Gastroenterology;  Laterality: N/A;   ESOPHAGOGASTRODUODENOSCOPY (EGD) WITH PROPOFOL  N/A 07/25/2021   Procedure: ESOPHAGOGASTRODUODENOSCOPY (EGD) WITH PROPOFOL ;  Surgeon: Stacia Glendia BRAVO, MD;  Location: [facility] ENDOSCOPY;  Service: Gastroenterology;  Laterality: N/A;   ESOPHAGOGASTRODUODENOSCOPY (EGD) WITH PROPOFOL  N/A 12/24/2021   Procedure: ESOPHAGOGASTRODUODENOSCOPY (EGD) WITH PROPOFOL ;  Surgeon: Wilhelmenia Aloha Raddle., MD;  Location: [facility] ENDOSCOPY;  Service: Gastroenterology;  Laterality: N/A;   flexible cystourethroscopy  05/24/06   HEMOSTASIS CLIP PLACEMENT  03/06/2021   Procedure: HEMOSTASIS CLIP PLACEMENT;  Surgeon: Leigh Elspeth SQUIBB, MD;  Location: [facility] ENDOSCOPY;  Service: Gastroenterology;;   HEMOSTASIS CLIP PLACEMENT  12/24/2021   Procedure: HEMOSTASIS CLIP PLACEMENT;  Surgeon: Wilhelmenia Aloha Raddle., MD;  Location: [facility] ENDOSCOPY;  Service: Gastroenterology;;   HOT HEMOSTASIS N/A 03/06/2021   Procedure: HOT HEMOSTASIS (ARGON PLASMA COAGULATION/BICAP);  Surgeon: Leigh Elspeth SQUIBB, MD;  Location: [facility] ENDOSCOPY;  Service:  Gastroenterology;  Laterality: N/A;   INGUINAL HERNIA REPAIR  2005   INSERTION OF ILIAC STENT Bilateral 08/16/2022   Procedure: INSERTION OF ILIAC STENT RIGHT EXTERNAL ARTERY, LEFT COMMON ARTERY, AND RIGHT COMMON ARTERY;  Surgeon: [name] ORN, MD;  Location: [facility] OR;  Service: Vascular;  Laterality: Bilateral;   IR CATHETER TUBE CHANGE  07/08/2022   LUNG LOBECTOMY Right 11/07   RUL   MULTIPLE TOOTH EXTRACTIONS     POLYPECTOMY  11/12/2020   Procedure: POLYPECTOMY;  Surgeon: Legrand Victory LITTIE DOUGLAS, MD;  Location: [facility] ENDOSCOPY;  Service: Gastroenterology;;   POLYPECTOMY  12/24/2021   Procedure: POLYPECTOMY;  Surgeon: Wilhelmenia Aloha Raddle., MD;  Location: [facility] ENDOSCOPY;  Service: Gastroenterology;;   SUBMUCOSAL LIFTING INJECTION  12/24/2021   Procedure: SUBMUCOSAL LIFTING INJECTION;  Surgeon: Wilhelmenia Aloha Raddle., MD;  Location: [facility] ENDOSCOPY;  Service: Gastroenterology;;   TRANSURETHRAL RESECTION OF PROSTATE N/A 05/02/2021   Procedure: TRANSURETHRAL RESECTION OF THE PROSTATE (TURP);  Surgeon: Carolee Sherwood JONETTA DOUGLAS, MD;  Location: [facility] ORS;  Service: Urology;  Laterality: N/A;   ULTRASOUND GUIDANCE FOR VASCULAR ACCESS Bilateral 08/16/2022   Procedure: ULTRASOUND GUIDANCE FOR VASCULAR ACCESS;  Surgeon: [name] ORN, MD;  Location: [facility] OR;  Service: Vascular;  Laterality: Bilateral;   UPPER ESOPHAGEAL ENDOSCOPIC ULTRASOUND (EUS) N/A 12/24/2021   Procedure: UPPER ESOPHAGEAL ENDOSCOPIC ULTRASOUND (EUS);  Surgeon: Wilhelmenia Aloha Raddle., MD;  Location: [facility] ENDOSCOPY;  Service: Gastroenterology;  Laterality: N/A;   UPPER GASTROINTESTINAL ENDOSCOPY     VIDEO BRONCHOSCOPY  07/27/2021   Procedure: VIDEO BRONCHOSCOPY WITHOUT FLUORO;  Surgeon: Shelah Lamar RAMAN, MD;  Location: [facility] ENDOSCOPY;  Service: Cardiopulmonary;;   VIDEO BRONCHOSCOPY Right 05/14/2024   Procedure: VIDEO BRONCHOSCOPY WITHOUT FLUORO;  Surgeon: Jude Harden GAILS, MD;  Location: [facility] ENDOSCOPY;  Service: Cardiopulmonary;  Laterality: Right;   Family  History Family History  Problem Relation Age of Onset   Diabetes Mother    Hypertension Sister    Heart disease Sister    Diabetes Sister    Arthritis Brother    Gout Brother    Colon cancer Neg Hx    Esophageal cancer Neg Hx    Rectal cancer Neg Hx    Stomach cancer Neg Hx    Colon polyps Neg Hx    Inflammatory bowel disease Neg Hx    Liver disease Neg Hx    Pancreatic cancer Neg Hx     Social History Social History   Tobacco Use   Smoking status: Some Days    Current packs/day: 0.75    Average packs/day: 0.8 packs/day for 55.0 years (41.3 ttl pk-yrs)    Types: Cigarettes   Smokeless tobacco: Never   Tobacco comments:    Smokes 2 -3 cigarettes per day sometimes.  Vaping Use   Vaping status: Never Used  Substance Use Topics   Alcohol use: Not Currently   Drug use: No   Allergies Candesartan cilexetil-hctz, Hydrochlorothiazide, Shellfish allergy, and Betadine [povidone iodine ]  Review of Systems Review of Systems  Physical Exam Vital Signs  I have reviewed the triage vital signs BP (!) 135/92 (BP Location: Right Arm)   Pulse 90   Temp (!) 97.4 F (36.3 C) (Oral)   Resp 14   Ht 6' (1.829 m)   Wt 63.5 kg   SpO2 100%   BMI 18.99 kg/m   Physical Exam Vitals and nursing note reviewed.  Constitutional:      Appearance: He is not toxic-appearing.  HENT:     Head: Normocephalic.  Eyes:     Pupils: Pupils are equal, round, and reactive to light.  Cardiovascular:     Rate and Rhythm: Normal rate.  Pulmonary:     Effort: Pulmonary effort is normal.     Breath sounds: No decreased breath sounds, wheezing, rhonchi or rales.  Chest:     Chest wall: No tenderness.  Musculoskeletal:        General: Normal range of motion.  Neurological:     General: No focal deficit present.     Mental Status: He is  alert.     ED Results and Treatments Labs (all labs ordered are listed, but only abnormal results are displayed) Labs Reviewed  BASIC METABOLIC PANEL WITH  GFR - Abnormal; Notable for the following components:      Result Value   Creatinine, Ser 1.50 (*)    Calcium  8.8 (*)    GFR, Estimated 47 (*)    All other components within normal limits  CBC WITH DIFFERENTIAL/PLATELET - Abnormal; Notable for the following components:   RBC 2.66 (*)    Hemoglobin 8.1 (*)    HCT 27.6 (*)    MCV 103.8 (*)    MCHC 29.3 (*)    RDW 17.0 (*)    Platelets 81 (*)    All other components within normal limits  PRO BRAIN NATRIURETIC PEPTIDE - Abnormal; Notable for the following components:   Pro Brain Natriuretic Peptide 14,957.0 (*)    All other components within normal limits  TROPONIN T, HIGH SENSITIVITY - Abnormal; Notable for the following components:   Troponin T High Sensitivity 77 (*)    All other components within normal limits                                                                                                                          Radiology DG Chest 1 View Result Date: 05/24/2024 EXAM: 1 VIEW(S) XRAY OF THE CHEST 05/24/2024 10:41:00 AM COMPARISON: 05/07/2024 CLINICAL HISTORY: cough FINDINGS: LUNGS AND PLEURA: Right lung volume loss with pleural effusion and airspace and interstitial opacities. Chronic right hemidiaphragm elevation. No pneumothorax. HEART AND MEDIASTINUM: Rightward mediastinal shift. Aortic atherosclerotic calcification. Abdominal aortic stent graft partially visualized. BONES AND SOFT TISSUES: No acute osseous abnormality. IMPRESSION: 1. Right lung volume loss with pleural effusion and airspace and interstitial opacities. 2. Rightward mediastinal shift. 3. Chronic right hemidiaphragm elevation. Electronically signed by: Norleen Boxer MD 05/24/2024 11:43 AM EST RP Workstation: [id number]    Pertinent labs & imaging results that were available during my care of the patient were reviewed by me and considered in my medical decision making (see MDM for details).  Medications Ordered in ED Medications - No data to display                                                                                                                                    Procedures Procedures  (including critical care time)  Medical Decision  Making / ED Course   This patient presents to the ED for concern of shortness of breath, this involves an extensive number of treatment options, and is a complaint that carries with it a high risk of complications and morbidity.  The differential diagnosis includes COPD exacerbation, less likely pneumonia, less likely pulmonary embolism, less likely ACS.  MDM: Upon arrival to the emergency department, patient's symptoms have completely resolved.  With this history, could certainly been a COPD exacerbation which EMS was able to treat with resolution of symptoms.  He does not appear fluid overloaded, bedside ultrasound shows normal IVC.  Blood work, EKG and chest x-ray ordered.  Reassessment 12 PM-patient's chest x-ray shows persistent right pleural effusion, no changes from prior chest x-ray on December 21.  Patient is on his usual 3 L home O2 without any dyspnea.  His troponin is stable at 77, his BNP is mildly improved at 15,000 compared to prior.  At this time, patient is at his baseline does not require additional intervention.  He is appropriate for discharge.  Arranged for additional home O2.  Additional history obtained: -Additional history obtained from EMS -External records from outside source obtained and reviewed including: Chart review including previous notes, labs, imaging, consultation notes   Lab Tests: -I ordered, reviewed, and interpreted labs.   The pertinent results include:   Labs Reviewed  BASIC METABOLIC PANEL WITH GFR - Abnormal; Notable for the following components:      Result Value   Creatinine, Ser 1.50 (*)    Calcium  8.8 (*)    GFR, Estimated 47 (*)    All other components within normal limits  CBC WITH DIFFERENTIAL/PLATELET - Abnormal; Notable for the  following components:   RBC 2.66 (*)    Hemoglobin 8.1 (*)    HCT 27.6 (*)    MCV 103.8 (*)    MCHC 29.3 (*)    RDW 17.0 (*)    Platelets 81 (*)    All other components within normal limits  PRO BRAIN NATRIURETIC PEPTIDE - Abnormal; Notable for the following components:   Pro Brain Natriuretic Peptide 14,957.0 (*)    All other components within normal limits  TROPONIN T, HIGH SENSITIVITY - Abnormal; Notable for the following components:   Troponin T High Sensitivity 77 (*)    All other components within normal limits      EKG rate controlled atrial flutter  EKG Interpretation Date/Time:  Monday May 24 2024 10:43:32 EST Ventricular Rate:  87 PR Interval:    QRS Duration:  120 QT Interval:  413 QTC Calculation: 497 R Axis:   -67  Text Interpretation: Atrial flutter Ventricular premature complex Left anterior fascicular block Anterior infarct, old Confirmed by Mannie Pac [phone]) on 05/24/2024 12:05:33 PM         Imaging Studies ordered: I ordered imaging studies including chest xray I independently visualized and interpreted imaging. I agree with the radiologist interpretation   Medicines ordered and prescription drug management: No orders of the defined types were placed in this encounter.   -I have reviewed the patients home medicines and have made adjustments as needed   Cardiac Monitoring: The patient was maintained on a cardiac monitor.  I personally viewed and interpreted the cardiac monitored which showed an underlying rhythm of: Rate controlled irregular rhythm  Social Determinants of Health:  Factors impacting patients care include: Lack of access to primary care, multiple medical comorbidities including COPD, atrial flutter.   Reevaluation: After the interventions noted above, I  reevaluated the patient and found that they have :improved  Co morbidities that complicate the patient evaluation  Past Medical History:  Diagnosis Date   AAA  (abdominal aortic aneurysm)    followed by vasc surgery   Alcohol abuse    Barretts esophagus    bx distal esophagus 2011 neg    Bladder outlet obstruction 04/02/2018   CAD in native artery    a. s/p stent to prox RCA and mid Cx.   Cataract    Cholelithiasis    Chronic osteomyelitis involving ankle and foot, right (HCC) 07/13/2018   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   2006 - 3 adenomas largest 12 mm  2008 - no polyps 2013-  2 mm cecal polyp, adenoma, routine repeat colonoscopy 02/2017 approx 05/14/2017 18 polyps max 12 mm ALL adenomas recall 2019 [name], MD, [facility]      Dieulafoy lesion of duodenum    Diverticulosis of colon    DJD (degenerative joint disease)    left hip   Full dentures    upper and lower   GERD (gastroesophageal reflux disease)    diet controlled    Gout    feet   Hiatal hernia    2 cm noted on upper endos 2011    Hx of adenomatous colonic polyps    Hyperlipidemia    Hypertension    Internal hemorrhoid    Lung cancer (HCC)    squamous cell (T2N0MX), 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 pt was on Plavix  x 3 years then transitioned to ASA, no problems since   Non-small cell carcinoma of right lung, stage 1 (HCC) 01/30/2016   Renal failure, acute    hx of 2/2 medication, resolved   Tobacco abuse    smoker .5 ppd x 55 yrs   UTI (lower urinary tract infection)       Dispostion: I considered admission for this patient, however given his near immediate improvement with EMS intervention, being at his baseline the emergency room, he does not require hospitalization at this time.     Final Clinical Impression(s) / ED Diagnoses Final diagnoses:  SOB (shortness of breath)  COPD exacerbation (HCC)     @PCDICTATION @    Mannie Pac T, DO 05/24/24 1207

## 2024-05-24 NOTE — ED Notes (Signed)
 Pt said he wants to leave without waiting for [facility] to come for O2 that is needed for transport. He states he understands the risk and that it is against the recommendations of the MD and hospital.

## 2024-05-25 ENCOUNTER — Other Ambulatory Visit: Payer: Self-pay

## 2024-05-25 NOTE — Patient Instructions (Signed)
 Visit Information  Thank you for taking time to visit with me today. Please don't hesitate to contact me if I can be of assistance to you before our next scheduled telephone appointment.  Our next appointment is by telephone on Tuesday December 16th at 10:00am  Following is a copy of your care plan:   Goals Addressed             This Visit's Progress    VBCI Transitions of Care (TOC) Care Plan       Problems: (reviewed 05/25/24) Recent Hospitalization for treatment of COPD Hospital or Ed Adm Risk is 99%  Goal: (reviewed 05/25/24) Over the next 30 days, the patient will not experience hospital readmission  Interventions: (reviewed 05/25/24)  COPD Interventions: Advised patient to track and manage COPD triggers Assessed social determinant of health barriers Discussed the importance of adequate rest and management of fatigue with COPD Provided education about and advised patient to utilize infection prevention strategies to reduce risk of respiratory infection Provided instruction about proper use of medications used for management of COPD including inhalers Use of home oxygen Encourage use of Nicotine  patient and smoking cessation Educated the patient on not using Nicotine  patch if he is smoking Cautioned oxygen and flammatory objects like cigarettes and oxygen Oxygen 3l/min 05/25/24 - The patient went to the ED on 05/24/24 for SOB and COPD exacerbation. He was started on Furosemide  for 7 days  Patient Self Care Activities: (reviewed 05/25/24) Attend all scheduled provider appointments Call pharmacy for medication refills 3-7 days in advance of running out of medications Call provider office for new concerns or questions  Notify RN Care Manager of [facility] call rescheduling needs Participate in Transition of Care Program/Attend [facility] scheduled calls Perform all self care activities independently  Take medications as prescribed    Plan:  Telephone follow up appointment with care  management team member scheduled for:  Tuesday December 16th at 10:00am        The patient verbalized understanding of instructions, educational materials, and care plan provided today and agreed to receive a mailed copy of patient instructions, educational materials, and care plan.   The patient has been provided with contact information for the care management team and has been advised to call with any health related questions or concerns.   Please call the care guide team at [phone] if you need to cancel or reschedule your appointment.   Please call the Suicide and Crisis Lifeline: 988 call the [place]  National Suicide Prevention Lifeline: (631)750-0703 or TTY: (213) 548-7177 TTY 718-248-4370) to talk to a trained counselor if you are experiencing a Mental Health or Behavioral Health Crisis or need someone to talk to.  Medford Balboa, BSN, RN [place]  VBCI - [facility] RN Care Manager [phone]

## 2024-05-25 NOTE — ED Provider Notes (Signed)
  [place] EMERGENCY DEPARTMENT AT [facility] Provider Note   CSN: [id number] Arrival date & time: 05/24/24  9047 Ultrasound ED Echo  Date/Time: 05/25/2024 7:04 AM  Performed by: Mannie Fairy DASEN, DO Authorized by: Mannie Fairy DASEN, DO   Procedure details:    Indications: dyspnea     Views: subxiphoid and IVC view     Images: archived   Findings:    Pericardium: no pericardial effusion     LV Function: depressed (30 - 50%)     RV Diameter: normal     IVC: normal   Impression:    Impression: decreased contractility            Mannie Fairy T, DO 05/25/24 [phone]

## 2024-05-25 NOTE — Transitions of Care (Post Inpatient/ED Visit) (Signed)
 Transition of Care week 2  Visit Note  05/25/2024  Name: [name] MRN: [id number]          DOB: [birth year]  Situation: Patient enrolled in [facility] 30-day program. Visit completed with [name] by telephone.   Background:   Initial Transition Care Management Follow-up Telephone Call Discharge Date and Diagnosis: 05/14/24, Pneumonia, COPD Exacerbation   Past Medical History:  Diagnosis Date   AAA (abdominal aortic aneurysm)    followed by vasc surgery   Alcohol abuse    Barretts esophagus    bx distal esophagus 2011 neg    Bladder outlet obstruction 04/02/2018   CAD in native artery    a. s/p stent to prox RCA and mid Cx.   Cataract    Cholelithiasis    Chronic osteomyelitis involving ankle and foot, right (HCC) 07/13/2018   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   2006 - 3 adenomas largest 12 mm  2008 - no polyps 2013-  2 mm cecal polyp, adenoma, routine repeat colonoscopy 02/2017 approx 05/14/2017 18 polyps max 12 mm ALL adenomas recall 2019 [name] [name], MD, [facility]      Dieulafoy lesion of duodenum    Diverticulosis of colon    DJD (degenerative joint disease)    left hip   Full dentures    upper and lower   GERD (gastroesophageal reflux disease)    diet controlled    Gout    feet   Hiatal hernia    2 cm noted on upper endos 2011    Hx of adenomatous colonic polyps    Hyperlipidemia    Hypertension    Internal hemorrhoid    Lung cancer (HCC)    squamous cell (T2N0MX), 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 His symptoms resolved with treatment provided by EMT and he was monitored in the hospital for a few hours and then sent home. Respiratory Management Strategies: Oxygen therapy, Routine screening, Adequate rest, Medication therapy Respiratory Self-Management Outcome: 3 (uncertain)  Endocrine Endocrine Symptoms Reported: No symptoms reported Is patient diabetic?: No    Gastrointestinal Gastrointestinal Symptoms Reported: No symptoms reported      Genitourinary Genitourinary Symptoms Reported: No symptoms reported Genitourinary Management Strategies: Medical device Genitourinary Comment: Suprapubic catheter  Integumentary Integumentary Symptoms Reported: No symptoms reported    Musculoskeletal Musculoskelatal Symptoms Reviewed: Joint pain Additional Musculoskeletal Details: Hip pain Musculoskeletal Management Strategies: Routine screening, Adequate rest, Medication therapy      Psychosocial Psychosocial Symptoms Reported: No symptoms reported         There were no vitals filed for this visit. Pain Score: 5  Pain Location: Hip  Patients Stated Pain Goal: 0 Pain Intervention(s): Medication (See eMAR)  Medications Reviewed Today     Reviewed by Moises Reusing, RN (Case Manager) on 05/25/24 at 1006  Med List Status: <None>   Medication Order Taking? Sig Documenting Provider Last Dose Status Informant  acetaminophen  (TYLENOL ) 500 MG tablet [id number] No Take 500 mg by mouth every 6 (six) hours as needed for moderate pain (pain score 4-6). [provider] 05/06/2024 Active Self, Pharmacy Records  albuterol  (VENTOLIN  HFA) 108 [phone] Base) MCG/ACT inhaler  [id number] No Inhale 1-2 puffs into the lungs every 6 (six) hours as needed for wheezing or shortness of breath. [name], DO 05/06/2024 Active Self, Pharmacy Records  amLODipine  (NORVASC ) 10 MG tablet [id number] No Take 1 tablet (10 mg total) by mouth daily. [name], DO 05/06/2024 Active Self, Pharmacy Records  amoxicillin -clavulanate (AUGMENTIN ) 500-125 MG tablet [id number]  Take 1 tablet by mouth 2 (two) times daily. Arlon Carliss ORN, DO  Active   aspirin  EC 81 MG tablet [id number] No Take 1 tablet (81 mg total) by mouth daily. Swallow whole. [name] ORN, MD 05/06/2024 Active Self, Pharmacy Records  clopidogrel  (PLAVIX ) 75 MG tablet [id number] No Take 1 tablet (75 mg total) by mouth daily. Bethanie Cough, PA-C 05/06/2024 Active Self, Pharmacy Records  escitalopram  (LEXAPRO ) 5 MG tablet [id number] No TAKE 1 TABLET (5 MG TOTAL) BY MOUTH DAILY. [name], DO 05/06/2024 Active Self, Pharmacy Records  furosemide  (LASIX ) 40 MG tablet [id number]  Take 1 tablet (40 mg total) by mouth daily for 7 days. Mannie Fairy DASEN, DO  Active   guaiFENesin  (MUCINEX ) 600 MG 12 hr tablet [id number]  Take 1 tablet (600 mg total) by mouth 2 (two) times daily.  Patient not taking: Reported on 05/17/2024   Arlon Carliss ORN, DO  Active   HYDROcodone -acetaminophen  [facility]) 7.5-325 MG tablet [id number]  Take 1 tablet by mouth 2 (two) times daily as needed for moderate pain (pain score 4-6). Fill 30 days after last fill. [name], DO  Active   metoprolol  succinate (TOPROL -XL) 25 MG 24 hr tablet [id number] No Take 1 tablet (25 mg total) by mouth daily. [name], DO 05/06/2024 Active Self, Pharmacy Records  mirtazapine  (REMERON ) 7.5 MG tablet [id number] No Take 2 tablets (15 mg total) by mouth at bedtime. [name], DO 05/06/2024 Active Self, Pharmacy Records  nicotine  (NICODERM CQ  - DOSED IN MG/24 HOURS) 14 mg/24hr patch [id number]  Place 1 patch (14 mg total) onto the skin daily.  Patient not  taking: Reported on 05/17/2024   Arlon Carliss ORN, DO  Active   Pancrelipase , Lip-Prot-Amyl, (ZENPEP ) 20000-63000 units CPEP [id number]  Take 3 capsules by mouth 3 (three) times daily before meals. [name], DO  Active   pantoprazole  (PROTONIX ) 40 MG tablet [id number] No TAKE 1 TABLET EVERY DAY FOR REFLUX [name], DO 05/06/2024 Active Self, Pharmacy Records  rosuvastatin  (CRESTOR ) 40 MG tablet [id number] No Take 1 tablet (40 mg total) by mouth daily. Jerrell Cleatus Ned, MD 05/06/2024 Active Self, Pharmacy Records  sacubitril -valsartan  (ENTRESTO ) 97-103 MG [id number] No Take 1 tablet by mouth 2 (two) times daily. [name], DO 05/06/2024 Active Self, Pharmacy Records  senna (SENOKOT) 8.6 MG TABS tablet [id number] No Take 1 tablet (8.6 mg total) by mouth in the morning and at bedtime. Napoleon Limes, MD 05/06/2024 Active Self, Pharmacy Records  Tiotropium Bromide-Olodaterol (STIOLTO RESPIMAT ) 2.5-2.5 MCG/ACT AERS [id number] No Inhale 2 puffs into the lungs daily. Hattar, Zola SAILOR,  MD 05/06/2024 Active Self, Pharmacy Records            Recommendation:   Continue Current Plan of Care  Follow Up Plan:   Telephone follow-up in 1 week  Medford Balboa, BSN, RN [place]  VBCI - [facility] RN Care Manager [phone]

## 2024-05-26 ENCOUNTER — Other Ambulatory Visit: Payer: Self-pay

## 2024-05-26 ENCOUNTER — Emergency Department (HOSPITAL_COMMUNITY)

## 2024-05-26 ENCOUNTER — Inpatient Hospital Stay (HOSPITAL_COMMUNITY): Admission: EM | Admit: 2024-05-26 | Discharge: 2024-05-29 | DRG: 291 | Disposition: A

## 2024-05-26 DIAGNOSIS — J441 Chronic obstructive pulmonary disease with (acute) exacerbation: Secondary | ICD-10-CM

## 2024-05-26 DIAGNOSIS — I509 Heart failure, unspecified: Secondary | ICD-10-CM

## 2024-05-26 DIAGNOSIS — J810 Acute pulmonary edema: Secondary | ICD-10-CM

## 2024-05-26 LAB — CBC
HCT: 27.2 % — ABNORMAL LOW (ref 39.0–52.0)
Hemoglobin: 8.2 g/dL — ABNORMAL LOW (ref 13.0–17.0)
MCH: 31.3 pg (ref 26.0–34.0)
MCHC: 30.1 g/dL (ref 30.0–36.0)
MCV: 103.8 fL — ABNORMAL HIGH (ref 80.0–100.0)
Platelets: 65 K/uL — ABNORMAL LOW (ref 150–400)
RBC: 2.62 MIL/uL — ABNORMAL LOW (ref 4.22–5.81)
RDW: 17.4 % — ABNORMAL HIGH (ref 11.5–15.5)
WBC: 7.7 K/uL (ref 4.0–10.5)
nRBC: 0 % (ref 0.0–0.2)

## 2024-05-26 LAB — BRAIN NATRIURETIC PEPTIDE: B Natriuretic Peptide: 1936.7 pg/mL — ABNORMAL HIGH (ref 0.0–100.0)

## 2024-05-26 LAB — I-STAT ARTERIAL BLOOD GAS, ED
Acid-base deficit: 2 mmol/L (ref 0.0–2.0)
Bicarbonate: 24 mmol/L (ref 20.0–28.0)
Calcium, Ion: 1.21 mmol/L (ref 1.15–1.40)
HCT: 27 % — ABNORMAL LOW (ref 39.0–52.0)
Hemoglobin: 9.2 g/dL — ABNORMAL LOW (ref 13.0–17.0)
O2 Saturation: 97 %
Patient temperature: 97.7
Potassium: 3.6 mmol/L (ref 3.5–5.1)
Sodium: 142 mmol/L (ref 135–145)
TCO2: 25 mmol/L (ref 22–32)
pCO2 arterial: 45 mmHg (ref 32–48)
pH, Arterial: 7.332 — ABNORMAL LOW (ref 7.35–7.45)
pO2, Arterial: 98 mmHg (ref 83–108)

## 2024-05-26 LAB — I-STAT VENOUS BLOOD GAS, ED
Acid-Base Excess: 0 mmol/L (ref 0.0–2.0)
Acid-base deficit: 1 mmol/L (ref 0.0–2.0)
Bicarbonate: 23.4 mmol/L (ref 20.0–28.0)
Bicarbonate: 22 mmol/L (ref 20.0–28.0)
Calcium, Ion: 1.07 mmol/L — ABNORMAL LOW (ref 1.15–1.40)
Calcium, Ion: 0.98 mmol/L — ABNORMAL LOW (ref 1.15–1.40)
HCT: 27 % — ABNORMAL LOW (ref 39.0–52.0)
HCT: 26 % — ABNORMAL LOW (ref 39.0–52.0)
Hemoglobin: 9.2 g/dL — ABNORMAL LOW (ref 13.0–17.0)
Hemoglobin: 8.8 g/dL — ABNORMAL LOW (ref 13.0–17.0)
O2 Saturation: 98 %
O2 Saturation: 79 %
Potassium: 4 mmol/L (ref 3.5–5.1)
Potassium: 3.8 mmol/L (ref 3.5–5.1)
Sodium: 142 mmol/L (ref 135–145)
Sodium: 141 mmol/L (ref 135–145)
TCO2: 24 mmol/L (ref 22–32)
TCO2: 23 mmol/L (ref 22–32)
pCO2, Ven: 35.5 mmHg — ABNORMAL LOW (ref 44–60)
pCO2, Ven: 26.6 mmHg — ABNORMAL LOW (ref 44–60)
pH, Ven: 7.427 (ref 7.25–7.43)
pH, Ven: 7.525 — ABNORMAL HIGH (ref 7.25–7.43)
pO2, Ven: 109 mmHg — ABNORMAL HIGH (ref 32–45)
pO2, Ven: 37 mmHg (ref 32–45)

## 2024-05-26 LAB — RESP PANEL BY RT-PCR (RSV, FLU A&B, COVID)  RVPGX2
Influenza A by PCR: NEGATIVE
Influenza B by PCR: NEGATIVE
Resp Syncytial Virus by PCR: NEGATIVE
SARS Coronavirus 2 by RT PCR: NEGATIVE

## 2024-05-26 LAB — COMPREHENSIVE METABOLIC PANEL WITH GFR
ALT: 59 U/L — ABNORMAL HIGH (ref 0–44)
AST: 57 U/L — ABNORMAL HIGH (ref 15–41)
Albumin: 2.7 g/dL — ABNORMAL LOW (ref 3.5–5.0)
Alkaline Phosphatase: 70 U/L (ref 38–126)
Anion gap: 8 (ref 5–15)
BUN: 22 mg/dL (ref 8–23)
CO2: 24 mmol/L (ref 22–32)
Calcium: 8 mg/dL — ABNORMAL LOW (ref 8.9–10.3)
Chloride: 108 mmol/L (ref 98–111)
Creatinine, Ser: 1.72 mg/dL — ABNORMAL HIGH (ref 0.61–1.24)
GFR, Estimated: 40 mL/min — ABNORMAL LOW (ref >60)
Glucose, Bld: 79 mg/dL (ref 70–99)
Potassium: 4.2 mmol/L (ref 3.5–5.1)
Sodium: 140 mmol/L (ref 135–145)
Total Bilirubin: 1.1 mg/dL (ref 0.0–1.2)
Total Protein: 5.7 g/dL — ABNORMAL LOW (ref 6.5–8.1)

## 2024-05-26 LAB — TROPONIN I (HIGH SENSITIVITY)
Troponin I (High Sensitivity): 47 ng/L — ABNORMAL HIGH (ref <18)
Troponin I (High Sensitivity): 38 ng/L — ABNORMAL HIGH (ref <18)

## 2024-05-26 LAB — D-DIMER, QUANTITATIVE: D-Dimer, Quant: 18.3 ug{FEU}/mL — ABNORMAL HIGH (ref 0.00–0.50)

## 2024-05-26 LAB — MRSA NEXT GEN BY PCR, NASAL: MRSA by PCR Next Gen: NOT DETECTED

## 2024-05-26 LAB — MAGNESIUM: Magnesium: 2.1 mg/dL (ref 1.7–2.4)

## 2024-05-26 MED ORDER — PANCRELIPASE (LIP-PROT-AMYL) 20000-63000 UNITS PO CPEP
3.0000 | ORAL_CAPSULE | Freq: Three times a day (TID) | ORAL | Status: DC
  Filled 2024-05-26: qty 3

## 2024-05-26 MED ORDER — AMLODIPINE BESYLATE 10 MG PO TABS
10.0000 mg | ORAL_TABLET | Freq: Every day | ORAL | Status: DC
  Administered 2024-05-26 – 2024-05-29 (×4): 10 mg via ORAL
  Filled 2024-05-26 (×2): qty 1
  Filled 2024-05-26: qty 2
  Filled 2024-05-26: qty 1

## 2024-05-26 MED ORDER — IOHEXOL 350 MG/ML SOLN
75.0000 mL | Freq: Once | INTRAVENOUS | Status: AC | PRN
  Administered 2024-05-26: 75 mL via INTRAVENOUS

## 2024-05-26 MED ORDER — UMECLIDINIUM BROMIDE 62.5 MCG/ACT IN AEPB
1.0000 | INHALATION_SPRAY | Freq: Every day | RESPIRATORY_TRACT | Status: DC
  Administered 2024-05-27 – 2024-05-29 (×3): 1 via RESPIRATORY_TRACT
  Filled 2024-05-26: qty 7

## 2024-05-26 MED ORDER — MIRTAZAPINE 15 MG PO TABS
15.0000 mg | ORAL_TABLET | Freq: Every day | ORAL | Status: DC
  Administered 2024-05-26 – 2024-05-28 (×3): 15 mg via ORAL
  Filled 2024-05-26 (×3): qty 1

## 2024-05-26 MED ORDER — ESCITALOPRAM OXALATE 10 MG PO TABS
5.0000 mg | ORAL_TABLET | Freq: Every day | ORAL | Status: DC
  Administered 2024-05-26 – 2024-05-29 (×4): 5 mg via ORAL
  Filled 2024-05-26 (×4): qty 1

## 2024-05-26 MED ORDER — METHYLPREDNISOLONE SODIUM SUCC 125 MG IJ SOLR
125.0000 mg | Freq: Once | INTRAMUSCULAR | Status: AC
  Administered 2024-05-26: 125 mg via INTRAVENOUS
  Filled 2024-05-26: qty 2

## 2024-05-26 MED ORDER — VANCOMYCIN HCL 1500 MG/300ML IV SOLN
1500.0000 mg | Freq: Once | INTRAVENOUS | Status: AC
  Administered 2024-05-26: 1500 mg via INTRAVENOUS
  Filled 2024-05-26: qty 300

## 2024-05-26 MED ORDER — SODIUM CHLORIDE 0.9 % IV SOLN
2.0000 g | Freq: Once | INTRAVENOUS | Status: AC
  Administered 2024-05-26: 2 g via INTRAVENOUS
  Filled 2024-05-26: qty 12.5

## 2024-05-26 MED ORDER — ARFORMOTEROL TARTRATE 15 MCG/2ML IN NEBU
15.0000 ug | INHALATION_SOLUTION | Freq: Two times a day (BID) | RESPIRATORY_TRACT | Status: DC
  Administered 2024-05-26 – 2024-05-29 (×6): 15 ug via RESPIRATORY_TRACT
  Filled 2024-05-26 (×6): qty 2

## 2024-05-26 MED ORDER — ASPIRIN 81 MG PO TBEC
81.0000 mg | DELAYED_RELEASE_TABLET | Freq: Every day | ORAL | Status: DC
  Administered 2024-05-26 – 2024-05-29 (×4): 81 mg via ORAL
  Filled 2024-05-26 (×4): qty 1

## 2024-05-26 MED ORDER — HYDROCODONE-ACETAMINOPHEN 7.5-325 MG PO TABS
1.0000 | ORAL_TABLET | Freq: Two times a day (BID) | ORAL | Status: DC | PRN
  Administered 2024-05-26 – 2024-05-28 (×2): 1 via ORAL
  Filled 2024-05-26 (×2): qty 1

## 2024-05-26 MED ORDER — PANCRELIPASE (LIP-PROT-AMYL) 36000-114000 UNITS PO CPEP
60000.0000 [IU] | ORAL_CAPSULE | Freq: Three times a day (TID) | ORAL | Status: DC
  Administered 2024-05-26 – 2024-05-29 (×8): 60000 [IU] via ORAL
  Filled 2024-05-26 (×11): qty 2

## 2024-05-26 MED ORDER — PANCRELIPASE (LIP-PROT-AMYL) 20000-63000 UNITS PO CPEP: 3.0000 | ORAL_CAPSULE | Freq: Three times a day (TID) | ORAL | Status: DC

## 2024-05-26 MED ORDER — ROSUVASTATIN CALCIUM 20 MG PO TABS
40.0000 mg | ORAL_TABLET | Freq: Every day | ORAL | Status: DC
  Administered 2024-05-26 – 2024-05-29 (×4): 40 mg via ORAL
  Filled 2024-05-26 (×4): qty 2

## 2024-05-26 MED ORDER — CLOPIDOGREL BISULFATE 75 MG PO TABS
75.0000 mg | ORAL_TABLET | Freq: Every day | ORAL | Status: DC
  Administered 2024-05-26 – 2024-05-29 (×4): 75 mg via ORAL
  Filled 2024-05-26 (×4): qty 1

## 2024-05-26 MED ORDER — IPRATROPIUM-ALBUTEROL 0.5-2.5 (3) MG/3ML IN SOLN
3.0000 mL | Freq: Once | RESPIRATORY_TRACT | Status: AC
  Administered 2024-05-26: 3 mL via RESPIRATORY_TRACT
  Filled 2024-05-26: qty 3

## 2024-05-26 MED ORDER — HEPARIN SODIUM (PORCINE) 5000 UNIT/ML IJ SOLN
5000.0000 [IU] | Freq: Three times a day (TID) | INTRAMUSCULAR | Status: DC
  Administered 2024-05-26 – 2024-05-29 (×9): 5000 [IU] via SUBCUTANEOUS
  Filled 2024-05-26 (×9): qty 1

## 2024-05-26 MED ORDER — IPRATROPIUM-ALBUTEROL 0.5-2.5 (3) MG/3ML IN SOLN: 3.0000 mL | Freq: Four times a day (QID) | RESPIRATORY_TRACT | Status: DC | PRN

## 2024-05-26 MED ORDER — FUROSEMIDE 10 MG/ML IJ SOLN
40.0000 mg | Freq: Once | INTRAMUSCULAR | Status: AC
  Administered 2024-05-26: 40 mg via INTRAVENOUS
  Filled 2024-05-26: qty 4

## 2024-05-26 MED ORDER — METOPROLOL SUCCINATE ER 25 MG PO TB24
25.0000 mg | ORAL_TABLET | Freq: Every day | ORAL | Status: DC
  Administered 2024-05-26 – 2024-05-29 (×4): 25 mg via ORAL
  Filled 2024-05-26 (×5): qty 1

## 2024-05-26 MED ORDER — SODIUM CHLORIDE 0.9 % IV SOLN: 2.0000 g | Freq: Two times a day (BID) | INTRAVENOUS | Status: DC

## 2024-05-26 MED ORDER — SACUBITRIL-VALSARTAN 97-103 MG PO TABS
1.0000 | ORAL_TABLET | Freq: Two times a day (BID) | ORAL | Status: DC
  Administered 2024-05-26 – 2024-05-29 (×7): 1 via ORAL
  Filled 2024-05-26 (×8): qty 1

## 2024-05-26 MED ORDER — FUROSEMIDE 10 MG/ML IJ SOLN
40.0000 mg | Freq: Two times a day (BID) | INTRAMUSCULAR | Status: AC
  Administered 2024-05-26 – 2024-05-28 (×5): 40 mg via INTRAVENOUS
  Filled 2024-05-26 (×6): qty 4

## 2024-05-26 MED ORDER — IPRATROPIUM-ALBUTEROL 0.5-2.5 (3) MG/3ML IN SOLN
3.0000 mL | Freq: Four times a day (QID) | RESPIRATORY_TRACT | Status: DC
  Administered 2024-05-26 – 2024-05-28 (×8): 3 mL via RESPIRATORY_TRACT
  Filled 2024-05-26 (×7): qty 3

## 2024-05-26 NOTE — Hospital Course (Signed)
 Resipratory distress, currently on BiPAP. Shortness of breath, fatigue, intiailly exam was ok. 95-6^, lung sounds unremarkable as well. Started decompensating pretty quickly, hypoxia. Desatting into high 80s, but hip on BiPAP. Underlying COPD, with Edema,   HF exacerbation

## 2024-05-26 NOTE — ED Notes (Signed)
 Patient trialed to [facility] by RT per Admitting MD request. Admitting MD at bedside.

## 2024-05-26 NOTE — ED Notes (Signed)
 Late lunch tray ordered will be up within an hour

## 2024-05-26 NOTE — Progress Notes (Unsigned)
 Cardiology Office Note:    Date:  06/03/2024   ID:  RAYLEE ADAMEC, DOB 06-30-1945, MRN [id number]  PCP:  [name], DO  Cardiologist:  Stanly DELENA Leavens, MD Cardiology APP:  Rana Lum LITTIE, NP  Electrophysiologist:  Will Gladis Norton, MD     Referring MD: [name], DO   Chief Complaint: follow-up of CAD, CHF, and atrial fibrillation  History of Present Illness:    [name] is a 78 y.o. 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 Leavens and Dr. Norton and presents today for follow-up of CAD, CHF, and atrial fibrillation.     CAD - NSTEMI in 2004 s/p DES to proximal RCA and then staged PCI with DES to LCx. - Multiple stress test since MI. Last Myoview  in 12/2020 was low risk with no evidence of ischemic or infarction.   Chronic HFmrEF - Echo in 05/2023 during an admission for flash pulmonary edema showed LVEF of 45-50% with severe hypokinesis of the basal to mid inferior wall, mildly reduced RV function with moderately elevated RVSP of 57.6 mmHg, and mild MR. Wall motion abnormalities were new. Cardiology was not  consulted.  - Repeat Echo in 04/2024 showed LVEF of 50-55% with no regional wall motion abnormalities and mild LVH, mildly enlarged RV with normal RV function and moderately elevated PASP of 54.6 mmHg, severely dilated left atrium, moderately dilated right atrium, mild AI, mild MR, and moderate TR.  Persistent Atrial Fibrillation NSVT Accelerated Idioventricular Rhythm - Outpatient monitor in 11/2020 showed probable atrial fibrillation as well as 27 episodes of VT (longest episode was 3 mins 15 secs with an average heart rate of 108 bpm and felt to be accelerated idioventricular rhythm). He was initially not started on anticoagulation as he was felt to be at a high bleeding risk.  - Subsequently started on Xarelto  but developed a GI bleed in 02/2021 requiring multiple blood transfusions. Xarelto  was stopped.  - He was referred to EP (Dr. Norton) in 05/2021 who reviewed monitor. He agreed that this appeared to shows atrial fibrillation but EKG showed organized atrial activity (ectopic atrial rhythm with PACs).  - Subsequently found to have clear atrial fibrillation which is now considered persistent. Plan is for rate control given inability to use anticoagulation.   Bradycardia History of 2nd Degree Type 1 AV Block - Noted to have bradycardia with triifascicular block and intermittent Mobitz 1 AV block during complex admission in 10/2020. Rates improved after Metoprolol  stopped. Metoprolol  was subsequently restarted. - Noted to have bradycardia with rates as low as the high 20s after vascular surgery in 08/2022. Metoprolol  again stopped but again was subsequently restarted. - Noted to have bradycardia with short pauses following another vascular surgery in 11/2023. Metoprolol   was again held but was resumed on discharge.  AAA - S/p fenestrated juxtarenal AAA repair with stenting of bilateral renal arteries as well as right internal iliac fenestration with stenting in 08/2022. -S/p endovascular repair of  thoracoabdominal aneurysm with stenting of the celiac artery, SMA , and bilateral renal arteries in 11/2023.  - Followed by Vascular Surgery.  Prior Hospitalizations - Complex admission in 10/2020 for sepsis secondary to bacteremia and complicated cystitis after presenting with progressive weakness, poor PO intake, and weight loss. 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 There was concern for possible pancreatic tumor. MRI/ MRCP showed no clear neoplasm but there was suspicion for pancreatic insufficiency. He was seen by GI, ID, Hem-Onc, Vascular Surgery, and Cardiology.  - Admission in  02/2021 for acute GI bleed after being started on Xarelto . Xarelto  was stopped and he required 3 units of PRBCs. 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  - Admission in 08/2022 for planned fenestrated juxtarenal AAA repair with stenting of bilateral renal arteries as well as right internal iliac fenestration with stenting. Post-op course was complicated by bradycardia as detailed above. - Admission in 05/2023 for flash pulmonary edema in setting of uncontrolled hypertension. High-sensitivity troponin was mildly elevated and felt to be most consistent with demand ischemia. Echo showed mildly reduced LVEF of 45-50% with severe hypokinesis of the basal to mid inferior wall, mildly reduced RV function with moderately elevated RVSP of 57.6 mmHg, and mild MR. Cardiology was not consulted but patient was diuresed with IV Lasix  and was started on Entresto . Patient was advised to follow-up with Cardiology as an outpatient but never did.  - Admission in 11/2023 for planned endovascular repair of thoracoabdominal  aneurysm with stenting of the celiac artery, SMA , and bilateral renal arteries. Post-op course complicated by bradycardia as well as ongoing sanguinous drainage from the right groin incision ultimately requiring 4 units of PRBCs.  - Admission in 03/2024 for acute on chronic hypoxic respiratory failure secondary to COPD exacerbation. - Admission in 04/2024 for acute on chronic hypoxic respiratory failure secondary to  post-obstructive pneumonia and COPD exacerbation. S/p bronchoscopy and suctioning of thick brown mucous plugging in the right middle and lower lobe.  - Admission from 05/26/2024 to 05/29/2024 for acute on chronic hypoxic respiratory failure secondary to acute on chronic CHF.      Patient has a complex history and has had multiple admission for a variety of issues as detailed above in the Narrative History. He was last seen by Lum Louis, NP, in 02/2024 at which time he was reported chronic and stable dyspnea on exertion but was doing well form a cardiac standpoint. Repeat Echo was ordered to reassess LV function and showed LVEF of 50-55% with no regional wall motion abnormalities and mild LVH, mildly enlarged RV with normal RV function and moderately elevated PASP of 54.6 mmHg, severely dilated left atrium, moderately dilated right atrium, mild AI, mild MR, and moderate TR. EF had improved from 45-50% from prior Echo in 12/2022 and the previously seen wall motion abnormalities were no longer evident. He was last seen by Dr. Inocencio in 03/2024 at which time he was stable from a cardiac standpoint.  He has had multiple admission lately for acute on chronic hypoxic respiratory failure due  to COPD exacerbation, post-obstructive pneumonia, and most recently acute on chronic CHF last week.   Patient presents today for follow-up. He has chronic dyspnea but overall it sounds like this is stable. He wears 3L of O2 via nasal cannula all the time at home but does not have a portable O2 tank so does  not wear his O2 when he leaves his house. He does feel more short of breath without the O2. No orthopnea or PND. He denies any lower extremity edema since most recent admission. No chest pain or palpitations. He does report some mild lightheadedness/ dizziness when he gets acutely short of breath but nothing outside these times. No syncope. Weight is stable around 138-140 lbs at home. He does endorse eating a high sodium diet and states he drinks a lot of fluid throughout the day as well. He does not seem to really understand his complex medical conditions.  EKGs/Labs/Other Studies Reviewed:    The following studies were reviewed:  Monitor 11/2020: Patch Wear Time:  2 days and 4 hours (2022-06-08T15:16:41-0400 to 2022-06-10T19:18:04-399)   27 Ventricular Tachycardia runs occurred, the run with the fastest interval lasting 17.7 secs with a max rate of 174 bpm, the longest lasting 3 mins 15 secs with an avg rate of 108 bpm. Atrial Fibrillation occurred continuously (100% burden), ranging  from 37-160 bpm (avg of 81 bpm). Idioventricular Rhythm was present. Isolated VEs were rare (<1.0%, 1986), VE Couplets were rare (<1.0%, 256), and VE Triplets were rare (<1.0%, 79). Ventricular Trigeminy was present.   Patch wear time was 2 days and 4 hours Monitor shows probable Afib with some possible episodes of non-conducted PACs Monitor also comments on 27 episodes of VT, however, review of the strips suggests possible accelerated idoventricular rhythm as rate is on the lower end for VT No significant pauses. _______________  Myoview  12/19/2020: Nuclear stress EF: 47%. The left ventricular ejection fraction is mildly decreased (45-54%). There was no ST segment deviation noted during stress. No T wave inversion was noted during stress. The study is normal. This is a low risk study.   1.  Reduced apical counts with normal wall motion consistent with apical thinning artifact.  No evidence of ischemia or  infarction. 2.  Normal LVEF for this modality, 47%. 3.  This is a low risk study. _______________  Echocardiogram 06/02/2023: Impressions: 1. Left ventricular ejection fraction, by estimation, is 45 to 50%. The  left ventricle has mildly decreased function. The left ventricle  demonstrates regional wall motion abnormalities with basal to mid inferior  severe hypokinesis. Left ventricular  diastolic parameters are indeterminate.   2. Right ventricular systolic function is mildly reduced. The right  ventricular size is mildly enlarged. There is moderately elevated  pulmonary artery systolic pressure. The estimated right ventricular  systolic pressure is 57.6 mmHg.   3. Left atrial size was mild to moderately dilated.   4. The mitral valve is normal in structure. Mild mitral valve  regurgitation. No evidence of mitral stenosis.   5. The aortic valve is tricuspid. Aortic valve regurgitation is not  visualized. No aortic stenosis is present.   6. The inferior vena cava is normal in size with <50% respiratory  variability, suggesting right atrial pressure of 8 mmHg.   7. The patient was in atrial fibrillation.  _______________  Echocardiogram 04/23/2024: Impressions: 1. Left ventricular ejection fraction, by estimation, is 50 to 55%. The  left ventricle has low normal function. The left ventricle has no regional  wall motion  abnormalities. There is mild left ventricular hypertrophy.  Left ventricular diastolic  parameters are indeterminate.   2. Right ventricular systolic function is normal. The right ventricular  size is mildly enlarged. There is moderately elevated pulmonary artery  systolic pressure. The estimated right ventricular systolic pressure is  54.6 mmHg.   3. Left atrial size was severely dilated.   4. Right atrial size was moderately dilated.   5. The mitral valve is normal in structure. Mild mitral valve  regurgitation. No evidence of mitral stenosis.   6. Tricuspid  valve regurgitation is moderate.   7. The aortic valve is normal in structure. Aortic valve regurgitation is  mild. No aortic stenosis is present.   8. The inferior vena cava is normal in size with greater than 50%  respiratory variability, suggesting right atrial pressure of 3 mmHg.   EKG:  EKG not ordered today.   Recent Labs: 05/24/2024: Pro Brain Natriuretic Peptide 14,957.0 05/26/2024: B Natriuretic Peptide 1,936.7 05/28/2024: ALT 69; Hemoglobin 7.7; Platelets 56 05/29/2024: BUN 24; Creatinine, Ser 1.71; Magnesium  2.0; Potassium 3.8; Sodium 137  Recent Lipid Panel    Component Value Date/Time   CHOL 124 03/15/2024 1549   TRIG 46 03/15/2024 1549   HDL 46 03/15/2024 1549   CHOLHDL 2.7 03/15/2024 1549   CHOLHDL 3.7 11/11/2020 1555   VLDL 11 11/11/2020 1555   LDLCALC 67 03/15/2024 1549    Physical Exam:    Vital Signs: BP 138/82   Pulse 61   Ht 6' (1.829 m)   Wt 139 lb 3.2 oz (63.1 kg)   SpO2 91%   BMI 18.88 kg/m     Wt Readings from Last 3 Encounters:  06/03/24 139 lb 3.2 oz (63.1 kg)  05/31/24 137 lb (62.1 kg)  05/29/24 135 lb 12.9 oz (61.6 kg)     General: 78 y.o. African-American male in no acute distress. HEENT: Normocephalic and atraumatic. Sclera clear.  Neck: Supple. No carotid bruits. No JVD. Heart: Irregularly irregular rhythm with normal rate. Distinct S1 and S2. No murmurs, gallops, or rubs.  Lungs: No increased work of breathing. Diminished breath sounds throughout.  No wheezes, rhonchi, or rales.  Extremities: Minimal ankle edema but no other lower extremity edema.   Skin: Warm and dry. Neuro: No focal deficits. Psych: Normal affect. Responds appropriately.  Assessment:    1. Coronary artery disease involving native coronary artery of native heart without angina pectoris   2. Chronic heart failure with mildly reduced ejection fraction (HFmrEF) (HCC)   3. Chronic obstructive pulmonary disease with emphysema, unspecified emphysema type (HCC)   4.  Chronic hypoxic respiratory failure (HCC)   5. Persistent atrial fibrillation (HCC)   6. NSVT (nonsustained ventricular tachycardia) (HCC)   7. Accelerated idioventricular rhythm   8. History of bradycardia   9. Atrioventricular block, Mobitz type 1, Wenckebach   10. Mitral valve insufficiency, unspecified etiology   11. Aortic valve insufficiency, etiology of cardiac valve disease unspecified   12. Tricuspid valve insufficiency, unspecified etiology   13. Abdominal aortic aneurysm (AAA) without rupture, unspecified part   14. Primary hypertension   15. Hyperlipidemia, unspecified hyperlipidemia type   16. Stage 3b chronic kidney disease (HCC)     Plan:    CAD History of NSTEMI in 2004 s/p DES to proximal RCA and then staged PCI with DES to LCx. Last ischemic evaluation was a Myoview  in 12/2020 which was low risk and showed no evidence of ischemia/ infarction.  - No chest pain.  - Continue  DAPT with Aspirin  81mg  daily and Plavix  75mg  daily for PAD. - Continue Crestor  40mg  daily.   Chronic HFmrEF Echo in 12/2022 during an admission for flash pulmonary edema showed mildly reduced EF of 45-50% withwith severe hypokinesis of the basal to mid inferior wall. Cardiology was not consulted at that time. However, repeat Echo in 04/2024 showed LVEF  with no regional wall motion abnormalities and mild LVH, mildly enlarged RV with normal RV function and moderately elevated PASP of 54.6 mmHg, severely dilated left atrium, moderately dilated right atrium, mild AI, mild MR, and moderate TR. He was recently admitted last week for acute on chronic CHF.  - He has chronic dyspnea, which is likely multifactorial, but appears euvolemic on exam.  - Continue Lasix  40mg  daily. - Continue Entresto  97-103mg  twice daily.  - Continue Toprol -XL 25mg  daily.  - No SGLT2 inhibitor given history of urinary retention with chronic suprapubic catheter.  - Discussed importance of daily weights and sodium/ fluid restrictions.  Advised him to let us  know if he has quick weight gain.  - Will check BMET today and consider adding Spironolactone if renal function is stable.  Emphysema Chronic Hypoxic Respiratory Failure Patient on 3L of O2 at home.  - Breathing stable. He has diminished breath sounds throughout but no wheezes, rhonchi, or rales.  - He was initially scheduled to have PFTs done on 05/24/2024 but was in the hospital. Asked him to call his Pulmonology office to reschedule this. - Also recommended he ask Pulmonology or PCP about getting a portable O2 tank or concentrator.   Persistent Atrial Fibrillation Rate controlled.  - Continue Toprol -XL 25mg  daily.  - CHA2DS2-VASc = 5 (CAD/ PAD, CHF, HTN, age x2). However, not on anticoagulation given multiple comorbidities and high bleeding risk.  - Plan is for rate control given inability to use anticoagulation.  - Followed by EP (Dr. Inocencio).  Non-Sustained VT Accelerated Idioventricular Rhythm Monitor in 11/2020 showed 27 episodes of VT. The fastest episode lasted 17.7 seconds with a max rate of 174 bpm. The longest episode lasted 3 mins 15 secs with an average rate of 108 bpm (felt to be accelerated idioventricular rhythm).  - No palpitations. - Continue Toprol -XL 25mg  daily.  - Followed by EP (Dr. Inocencio).  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 He is currently tolerating a low dose beta-blocker.  - No bradycardiac today. - Continue Toprol -XL 25mg  daily.  - Followed by EP (Dr. Inocencio).  Mild Mitral Regurgitation Mild Aortic Insufficiency Moderate Tricuspid Regurgitation Noted on recent Echo in 04/2024.  - Can continue routine imaging with surveillance Echos per guidelines.   AAA S/p fenestrated juxtarenal AAA repair with stenting of bilateral renal  arteries as well as right internal iliac fenestration with stenting in 08/2022 and then endovascular repair of thoracoabdominal aneurysm with stenting of the celiac artery, SMA , and bilateral renal arteries in 11/2023.  - Continue DAPT with Aspirin  81mg  daily and Plavix  75mg  daily.  - Continue Crestor  40mg  daily.  - Followed by Vascular Surgery.  Hypertension BP mildly elevated at 138/82. Goal <130/80. - Continue current medications: Entresto  97-103mg  twice daily, Amlodipine  10mg  daily, and Toprol -XL 25mg  daily. - If renal function stable on labs today, will likely add Spironolactone.  Hyperlipidemia Lipid panel in 02/2024: Total Cholesterol 124, Triglycerides 46, HDL 46, LDL 67. -  Continue Crestor  40mg  daily.  - Did not specifically discuss today.  CKD Stage IIIb Baseline creatinine looks to be around 1.8 but creatinine has ranged from 1.4 to 2.2 over the last several months. Most recent creatinine 1.71 on 05/29/2024.  - Will repeat BMET today.    Disposition: Follow up in 3 months.   Signed, Pearley Baranek E Stefana Lodico, PA-C  06/03/2024 3:15 PM    [facility]

## 2024-05-26 NOTE — Progress Notes (Signed)
 Patient transported to CT and back to ED room 24 on bipap without complications.

## 2024-05-26 NOTE — Progress Notes (Signed)
° °  Brief Progress Note   _____________________________________________________________________________________________________________  Patient Name: [name] Patient DOB: 04-09-1946 Date: @TODAY @      Data: Reviewed vital signs, labs, and notes.    Action: No action required at this time.    Response:  Recently taken off Bipap and placed on O2 4LNC.  May require Bipap support again.   _____________________________________________________________________________________________________________  [facility] RN Expeditor Manda Holstad S Kairyn Olmeda Please contact us  directly via secure chat (search for [facility]) or by calling us  at [phone] [facility]).

## 2024-05-26 NOTE — ED Provider Notes (Signed)
 [place] EMERGENCY DEPARTMENT AT [facility] Provider Note   CSN: [id number] Arrival date & time: 05/26/24  9391     Patient presents with: Shortness of Breath (Woke up short of breath this am)   [name] is a 78 y.o. male.    Shortness of Breath    Patient presents because of shortness of breath.  Increasing shortness of breath over the past couple days.  Denies any chest pain.  Endorses exertional shortness of breath.  No nausea vomit diarrhea.  No abdominal pain.  Patient states that called EMS because was feeling short of breath.  Upon EMS arrival, patient was on 1.5 L nasal cannula.  Usually on 3 L nasal cannula.  Patient denies any obvious fever or chills.  No sick contacts he is aware of.  And compliant with all of his medication.  Denies any chest pain this time nor any kind of exertional chest pain.  Previous medical history reviewed : Patient was seen on May 24, 2024.  Shortness of breath.  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  A-fib.  CHF.  Patient was discharged on May 14, 2024.  Postobstructive pneumonia.  Status post bronc.  Mucous plugging.   Prior to Admission medications   Medication Sig Start Date End Date Taking? Authorizing Provider  acetaminophen  (TYLENOL ) 500 MG tablet Take 500 mg by mouth every 6 (six) hours as needed for moderate pain (pain score 4-6).   Yes [provider]  albuterol  (VENTOLIN  HFA) 108 (90 Base) MCG/ACT inhaler Inhale 1-2 puffs into the lungs every 6 (six) hours as needed for wheezing or shortness of breath. 12/23/23  Yes [name], DO  amLODipine  (NORVASC ) 10 MG tablet Take 1 tablet (10 mg total) by mouth daily. 12/09/23 12/08/24 Yes [name], DO  aspirin  EC 81 MG tablet Take 1 tablet (81 mg total) by mouth daily. Swallow whole. 04/14/23  Yes [name] ORN, MD  clopidogrel  (PLAVIX ) 75 MG tablet Take 1 tablet (75 mg total) by mouth daily. 12/11/23  Yes Bethanie Cough, PA-C  escitalopram   (LEXAPRO ) 5 MG tablet TAKE 1 TABLET (5 MG TOTAL) BY MOUTH DAILY. 12/09/23 06/06/24 Yes [name], DO  furosemide  (LASIX ) 40 MG tablet Take 1 tablet (40 mg total) by mouth daily for 7 days. 05/24/24 05/31/24 Yes Mannie Pac T, DO  HYDROcodone -acetaminophen  (NORCO) 7.5-325 MG tablet Take 1 tablet by mouth 2 (two) times daily as needed for moderate pain (pain score 4-6). Fill 30 days after last fill. 05/21/24  Yes [name], DO  metoprolol  succinate (TOPROL -XL) 25 MG 24 hr tablet Take 1 tablet (25 mg total) by mouth daily. 03/26/24 03/26/25 Yes [name], DO  mirtazapine  (REMERON ) 7.5 MG tablet Take 2 tablets (15 mg total) by mouth at bedtime. 03/24/24  Yes [name], DO  Pancrelipase , Lip-Prot-Amyl, (ZENPEP ) 20000-63000 units CPEP Take 3 capsules by mouth 3 (three) times daily before meals. 05/21/24  Yes Rosan Dayton C, DO  pantoprazole  (PROTONIX ) 40 MG tablet TAKE 1 TABLET EVERY DAY FOR REFLUX 12/09/23  Yes [name], DO  rosuvastatin  (CRESTOR ) 40 MG tablet Take 1 tablet (40 mg total) by mouth daily. 04/03/23  Yes Jerrell Cleatus Ned, MD  sacubitril -valsartan  (ENTRESTO ) 97-103 MG Take 1 tablet by mouth 2 (two) times daily. 04/19/24  Yes [name], DO  senna (SENOKOT) 8.6 MG TABS tablet Take 1 tablet (8.6 mg total) by mouth in the morning and at bedtime. 03/27/24  Yes Smucker, Nathanael, MD  Tiotropium Bromide-Olodaterol (STIOLTO RESPIMAT ) 2.5-2.5 MCG/ACT AERS Inhale 2 puffs into the lungs daily. 02/27/24  Yes Hattar, Zola SAILOR, MD  amoxicillin -clavulanate (AUGMENTIN ) 500-125 MG tablet Take 1 tablet by mouth 2 (two) times daily. Patient not taking: Reported on 05/26/2024 05/14/24   Arlon Carliss ORN, DO    Allergies: Candesartan cilexetil-hctz, Hydrochlorothiazide, Shellfish allergy, and Betadine [povidone iodine ]    Review of Systems  Respiratory:  Positive for shortness of breath.     Updated Vital Signs BP (!) 148/94   Pulse (!) 58   Temp (!) 97.5 F (36.4  C) (Oral)   Resp 20   SpO2 99%   Physical Exam Vitals and nursing note reviewed.  Constitutional:      General: He is not in acute distress.    Appearance: He is well-developed.  HENT:     Head: Normocephalic and atraumatic.  Eyes:     Conjunctiva/sclera: Conjunctivae normal.  Cardiovascular:     Rate and Rhythm: Normal rate and regular rhythm.     Heart sounds: No murmur heard. Pulmonary:     Effort: Pulmonary effort is normal. No respiratory distress.     Breath sounds: Normal breath sounds.  Abdominal:     Palpations: Abdomen is soft.     Tenderness: There is no abdominal tenderness.  Musculoskeletal:        General: No swelling.     Cervical back: Neck supple.     Right lower leg: Edema present.     Left lower leg: Edema present.  Skin:    General: Skin is warm and dry.     Capillary Refill: Capillary refill takes less than 2 seconds.  Neurological:     Mental Status: He is alert.  Psychiatric:        Mood and Affect: Mood normal.     (all labs ordered are listed, but only abnormal results are displayed) Labs Reviewed  COMPREHENSIVE METABOLIC PANEL WITH GFR - Abnormal; Notable for the following components:      Result Value   Creatinine, Ser 1.72 (*)    Calcium  8.0 (*)    Total Protein 5.7 (*)    Albumin  2.7 (*)    AST 57 (*)    ALT 59 (*)    GFR, Estimated 40 (*)    All other components within normal limits  CBC - Abnormal; Notable for the following components:   RBC 2.62 (*)    Hemoglobin 8.2 (*)    HCT 27.2 (*)    MCV 103.8 (*)    RDW 17.4 (*)    Platelets 65 (*)    All other components within normal limits  BRAIN NATRIURETIC PEPTIDE - Abnormal; Notable for the following components:   B Natriuretic Peptide 1,936.7 (*)    All other components within normal limits  D-DIMER, QUANTITATIVE - Abnormal; Notable for the following components:   D-Dimer, Quant 18.30 (*)    All other components within normal limits  I-STAT VENOUS BLOOD GAS, ED - Abnormal;  Notable for the following components:   pCO2, Ven 35.5 (*)    pO2, Ven 109 (*)    Calcium , Ion 1.07 (*)    HCT 27.0 (*)    Hemoglobin 9.2 (*)    All other components within normal limits  I-STAT VENOUS BLOOD GAS, ED - Abnormal; Notable for the following components:   pH, Ven 7.525 (*)    pCO2, Ven 26.6 (*)    Calcium , Ion 0.98 (*)  HCT 26.0 (*)    Hemoglobin 8.8 (*)    All other components within normal limits  I-STAT ARTERIAL BLOOD GAS, ED - Abnormal; Notable for the following components:   pH, Arterial 7.332 (*)    HCT 27.0 (*)    Hemoglobin 9.2 (*)    All other components within normal limits  TROPONIN I (HIGH SENSITIVITY) - Abnormal; Notable for the following components:   Troponin I (High Sensitivity) 47 (*)    All other components within normal limits  TROPONIN I (HIGH SENSITIVITY) - Abnormal; Notable for the following components:   Troponin I (High Sensitivity) 38 (*)    All other components within normal limits  RESP PANEL BY RT-PCR (RSV, FLU A&B, COVID)  RVPGX2  CULTURE, BLOOD (ROUTINE X 2)  CULTURE, BLOOD (ROUTINE X 2)  MRSA NEXT GEN BY PCR, NASAL  RESPIRATORY PANEL BY PCR  MAGNESIUM     EKG: EKG Interpretation Date/Time:  Wednesday May 26 2024 07:48:01 EST Ventricular Rate:  107 PR Interval:    QRS Duration:  114 QT Interval:  337 QTC Calculation: 450 R Axis:   248  Text Interpretation: Atrial fibrillation Left anterior fascicular block Probable lateral infarct, age indeterminate Confirmed by Simon Rea [phone]) on 05/26/2024 11:37:51 AM  Radiology: CT Angio Chest PE W and/or Wo Contrast Result Date: 05/26/2024 CLINICAL DATA:  Decreased O2 sats. Increased work of breathing. Evaluate for pulmonary embolus. EXAM: CT ANGIOGRAPHY CHEST WITH CONTRAST TECHNIQUE: Multidetector CT imaging of the chest was performed using the standard protocol during bolus administration of intravenous contrast. Multiplanar CT image reconstructions and MIPs were obtained to  evaluate the vascular anatomy. RADIATION DOSE REDUCTION: This exam was performed according to the departmental dose-optimization program which includes automated exposure control, adjustment of the mA and/or kV according to patient size and/or use of iterative reconstruction technique. CONTRAST:  75mL OMNIPAQUE  IOHEXOL  350 MG/ML SOLN COMPARISON:  05/10/2024 FINDINGS: Cardiovascular: Heart size upper normal. Coronary artery calcification is evident. Moderate atherosclerotic calcification is noted in the wall of the thoracic aorta. Ascending thoracic aorta measures up to 4.2 cm diameter. Enlargement of the pulmonary outflow tract/main pulmonary arteries suggests pulmonary arterial hypertension. There is no filling defect within the opacified pulmonary arteries to suggest the presence of an acute pulmonary embolus. Mediastinum/Nodes: No mediastinal lymphadenopathy. There is no hilar lymphadenopathy. Surgical changes are noted in the right hilum compatible with right upper lobectomy. Lungs/Pleura: Volume loss in the right hemithorax is similar. Centrilobular and paraseptal emphysema evident. Circumferential wall thickening and septal markings are more prominent in both lungs today, raising the question of interstitial edema. No dense focal airspace consolidation. Trace left pleural effusion evident with dependent atelectasis in the lung bases. Upper Abdomen: Gallbladder is distended with calcified stone evident. Right upper pole renal cyst incompletely visualized. Patient is status post abdominal aortic stent graft placement. 2.1 cm exophytic lesion with increased attenuation in the upper pole left kidney is stable comparing back to 01/31/2022 exam compatible with benign etiology. Musculoskeletal: No worrisome lytic or sclerotic osseous abnormality. Review of the MIP images confirms the above findings. IMPRESSION: 1. No CT evidence for acute pulmonary embolus. 2. Enlargement of the pulmonary outflow tract/main pulmonary  arteries suggests pulmonary arterial hypertension. 3. Circumferential wall thickening and septal markings are more prominent in both lungs today, raising the question of interstitial edema. 4. Ascending thoracic aorta measures up to 4.2 cm diameter. Recommend annual imaging followup by CTA or MRA. This recommendation follows 2010 ACCF/AHA/AATS/ACR/ASA/SCA/SCAI/SIR/STS/SVM Guidelines for the Diagnosis and Management of  Patients with Thoracic Aortic Disease. Circulation. 2010; 121: Z733-z630. Aortic aneurysm NOS (ICD10-I71.9) 5. Trace left pleural effusion with dependent atelectasis in the lung bases. 6. Cholelithiasis. 7. Aortic Atherosclerosis (ICD10-I70.0) and Emphysema (ICD10-J43.9). Electronically Signed   By: Camellia Candle M.D.   On: 05/26/2024 10:07   DG Chest Port 1 View Result Date: 05/26/2024 EXAM: 1 VIEW(S) XRAY OF THE CHEST 05/26/2024 07:17:30 AM COMPARISON: Portable chest 05/24/2024 at 10:37 am. CLINICAL HISTORY: SOB. FINDINGS: LUNGS AND PLEURA: Right chest volume loss, chronic . Coarse interstitial changes again noted in the right lower lung field. Increased patchy opacity in the left base, which could be atelectasis or pneumonia versus aspiration. No overt edema is seen. No pleural effusion. No pneumothorax. HEART AND MEDIASTINUM: Mediastinal shift to the right also chronic . Cardiomegaly with increased central vascular prominence. Stable mediastinum with aortic tortuosity, calcification, and dilatation. BONES AND SOFT TISSUES: No acute osseous abnormality. IMPRESSION: 1. Increased patchy opacity in the left lung base, which may represent atelectasis, pneumonia, or aspiration. 2. Right-sided volume loss with mediastinal shift to the right. 3. Cardiomegaly with increased central vascular prominence, without overt edema. Electronically signed by: Francis Quam MD 05/26/2024 07:30 AM EST RP Workstation: [id number]     Procedures   Medications Ordered in the ED  ipratropium-albuterol  (DUONEB)  0.5-2.5 (3) MG/3ML nebulizer solution 3 mL (3 mLs Nebulization Given 05/26/24 1408)  heparin  injection 5,000 Units (5,000 Units Subcutaneous Given 05/26/24 1526)  furosemide  (LASIX ) injection 40 mg (has no administration in time range)  amLODipine  (NORVASC ) tablet 10 mg (10 mg Oral Given 05/26/24 1525)  aspirin  EC tablet 81 mg (81 mg Oral Given 05/26/24 1525)  clopidogrel  (PLAVIX ) tablet 75 mg (75 mg Oral Given 05/26/24 1525)  escitalopram  (LEXAPRO ) tablet 5 mg (5 mg Oral Given 05/26/24 1525)  HYDROcodone -acetaminophen  (NORCO) 7.5-325 MG per tablet 1 tablet (has no administration in time range)  mirtazapine  (REMERON ) tablet 15 mg (has no administration in time range)  rosuvastatin  (CRESTOR ) tablet 40 mg (40 mg Oral Given 05/26/24 1525)  sacubitril -valsartan  (ENTRESTO ) 97-103 mg per tablet (1 tablet Oral Given 05/26/24 1531)  arformoterol  (BROVANA ) nebulizer solution 15 mcg (has no administration in time range)    And  umeclidinium bromide  (INCRUSE ELLIPTA ) 62.5 MCG/ACT 1 puff (1 puff Inhalation Not Given 05/26/24 1503)  lipase/protease/amylase (CREON ) capsule 60,000 Units (has no administration in time range)  ipratropium-albuterol  (DUONEB) 0.5-2.5 (3) MG/3ML nebulizer solution 3 mL (3 mLs Nebulization Given 05/26/24 0738)  methylPREDNISolone  sodium succinate  (SOLU-MEDROL ) 125 mg/2 mL injection 125 mg (125 mg Intravenous Given 05/26/24 0740)  furosemide  (LASIX ) injection 40 mg (40 mg Intravenous Given 05/26/24 0744)  ceFEPIme  (MAXIPIME ) 2 g in sodium chloride  0.9 % 100 mL IVPB (0 g Intravenous Stopped 05/26/24 0847)  vancomycin  (VANCOREADY) IVPB 1500 mg/300 mL (0 mg Intravenous Stopped 05/26/24 1221)  iohexol  (OMNIPAQUE ) 350 MG/ML injection 75 mL (75 mLs Intravenous Contrast Given 05/26/24 0918)                                    Medical Decision Making Amount and/or Complexity of Data Reviewed Labs: ordered. Radiology: ordered.  Risk Prescription drug management. Decision regarding  hospitalization.       HPI:   Patient presents because of shortness of breath.  Increasing shortness of breath over the past couple days.  Denies any chest pain.  Endorses exertional shortness of breath.  No nausea vomit diarrhea.  No abdominal pain.  Patient states that called EMS because was feeling short of breath.  Upon EMS arrival, patient was on 1.5 L nasal cannula.  Usually on 3 L nasal cannula.  Patient denies any obvious fever or chills.  No sick contacts he is aware of.  And compliant with all of his medication.  Denies any chest pain this time nor any kind of exertional chest pain.  Previous medical history reviewed : Patient was seen on May 24, 2024.  Shortness of breath.  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  A-fib.  CHF.  Patient was discharged on May 14, 2024.  Postobstructive pneumonia.  Status post bronc.  Mucous plugging.  MDM:   Upon exam, patient O2 saturation mid to high 90s on 3 L nasal cannula.  No acute distress.   Lung sounds were clear to auscultation bilaterally.  No obvious rales rhonchi or wheezing that I can appreciate.   Will obtain laboratory workup.  BMP to assess for volume status.  Chest x-ray to rule out, pneumonia pulmonary edema pulmonary effusions.  No concerns for ACS pathology at this time.  Patient rate controlled A-fib which is largely his baseline at this time.  No risk factors for DVT or PE.  No pleuritic chest pain or hemoptysis.  No tachypnea or tachycardia on exam.   Obtain electrolyte workup as well to assess for large electrolyte derangement or AKI.  Soft and benign abdomen.    Reevaluation:   Upon reexamination, patient now complains of some chest pain.  Worsening shortness of breath.  O2 saturation 88% on 3 L nasal cannula.  Subsequently uptitrated to 5 L nasal cannula.  Some mild expiratory wheezing.  Plan for the chest pain as well.  Will repeat EKG.  Add on troponin.  Will add on D-dimer given some tachypnea as well  and tachycardia and shortness of breath with the chest pain.  X-ray showed possible infiltrate.  Given patient's worsening presentation, did end up covering patient for hospital-acquired pneumonia given recent admission   CTA chest was negative for acute pathology.  No PE.  No obvious infiltrate at this time  Start patient on BiPAP.  Obtain PT as well as ABG.  ABG showed no hypercapnia.  Obtain ABG to chest for any kind of hypoxia.  PaO2 was in the 90s.  Granted, this patient had been on BiPAP for approximately 3 to 4 minutes before obtaining the PaO2.  BNP elevated.  CTA of the chest consistent for some pulmonary edema.  Consistent for BNP elevation as well as bilateral lower extremity swelling.  Therefore, did give patient 40 mg of IV Lasix .  Patient much more comfortable on BiPAP.  Mentating well.  Oxygen saturation appropriate.   EKG showed no obvious acute changes consistent for STEMI.  Interventions: Bipap, lasix    EKG Interpreted by Me: sinus    Cardiac Tele Interpreted by Me: sinus    I have independently interpreted the CXR  and CT  images and agree with the radiologist finding   Social Determinant of Health: Continues to smoke    Disposition and Follow Up: admit   CRITICAL CARE Performed by: Lavonia LOISE Pat   Total critical care time: 45 minutes  Critical care time was exclusive of separately billable procedures and treating other patients.  Critical care was necessary to treat or prevent imminent or life-threatening deterioration.  Critical care was time spent personally by me on the following activities: development of treatment plan with patient and/or surrogate as well as  nursing, discussions with consultants, evaluation of patient's response to treatment, examination of patient, obtaining history from patient or surrogate, ordering and performing treatments and interventions, ordering and review of laboratory studies, ordering and review of radiographic studies,  pulse oximetry and re-evaluation of patient's condition.        Final diagnoses:  Hypoxia  COPD exacerbation (HCC)  Acute pulmonary edema [facility])    ED Discharge Orders     None          Simon Lavonia SAILOR, MD 05/26/24 [phone]

## 2024-05-26 NOTE — ED Notes (Signed)
 CT called by RN as well as RT

## 2024-05-26 NOTE — H&P (Signed)
 Date: 05/26/2024               Patient Name:  [name] MRN: [id number]  DOB: 1946/06/12 Age / Sex: 78 y.o., male   PCP: [name], DO         Medical Service: Internal Medicine Teaching Service         Attending Physician: Dr. Shawn Sick, MD      First Contact: Dr. Alan Maiden, MD    Second Contact: Dr. Lonni Africa, DO         After Hours (After 5p/  First Contact Pager: [phone]  weekends / holidays): Second Contact Pager: [phone]   SUBJECTIVE   Chief Complaint:   History of Present Illness:   Arran Fessel is a 78 year old male with past medical history significant of chronic hypoxic respiratory failure on 2-3L of oxygen at baseline, COPD, abdominal aortic aneurysm, alcohol usage, CAD, GERD, hypertension, hyperlipidemia, and lung cancer who is presenting with shortness of breath. He stated that he had been feeling fine all yesterday, then went to sleep. He woke up in the middle of the night and felt very short of breath, and called 911. He's normally on 3L, but EMS had told him his oxygen was at 1.5L and he isn't sure why. He declines any fevers, chills, nausea, vomiting, cough.   He also noticed that he's been volume overloaded, specifically with his lower extremity edema. He noticed it first yesterday, and he had recently gone to the emergency department for evaluation of this and was given PO Lasix  which he had taken yesterday for the first time. He had similar symptoms on 12/8, for which he received a breathing treatment for by EMS en route to the hospital.   ED Course: Placed on BiPAP, weaned off. IMTS consulted for admission.   Meds:   Albuterol  inhaler PRN  Norvasc  10mg   Aspirin  81mg   Plavix  75mg  Lexapro  5mg   Lasix  40mg   Norco 7.5-325mg  BID PRN  Metoprolol  25mg   Remeron  15mg  Daily  Zenpep  20000U before meals  Protonix  40mg  daily  Crestor  40mg  Daily  Entresto  97-103mg  BID  Stioloto inhaler BID   Past Medical History  Past Surgical  History:  Procedure Laterality Date   ABDOMINAL AORTIC ENDOVASCULAR FENESTRATED STENT GRAFT N/A 08/16/2022   Procedure: ABDOMINAL AORTIC ENDOVASCULAR FENESTRATED STENT GRAFT;  Surgeon: [name] ORN, MD;  Location: [facility] OR;  Service: Vascular;  Laterality: N/A;   ABDOMINAL AORTIC ENDOVASCULAR FENESTRATED STENT GRAFT N/A 12/02/2023   Procedure: ABDOMINAL AORTIC ENDOVASCULAR FENESTRATED STENT GRAFT WITH ULTRASOUND GUIDED ACCESS;  Surgeon: [name] ORN, MD;  Location: [facility] INVASIVE CV LAB;  Service: Cardiovascular;  Laterality: N/A;   BIOPSY  11/12/2020   Procedure: BIOPSY;  Surgeon: Legrand Victory LITTIE DOUGLAS, MD;  Location: [facility] ENDOSCOPY;  Service: Gastroenterology;;   BIOPSY  07/25/2021   Procedure: BIOPSY;  Surgeon: Stacia Glendia BRAVO, MD;  Location: [facility] ENDOSCOPY;  Service: Gastroenterology;;   BIOPSY  12/24/2021   Procedure: BIOPSY;  Surgeon: Wilhelmenia Aloha Raddle., MD;  Location: [facility];  Service: Gastroenterology;;   BRONCHIAL BIOPSY  07/27/2021   Procedure: BRONCHIAL BIOPSIES;  Surgeon: Shelah Lamar RAMAN, MD;  Location: [facility] ENDOSCOPY;  Service: Cardiopulmonary;;   BRONCHIAL BRUSHINGS  07/27/2021   Procedure: BRONCHIAL BRUSHINGS;  Surgeon: Shelah Lamar RAMAN, MD;  Location: [facility] ENDOSCOPY;  Service: Cardiopulmonary;;   cataract surgery Bilateral 2018   removal of cataracts   COLONOSCOPY     hx polyps - [facility] 02/2012   COLONOSCOPY WITH  PROPOFOL  N/A 11/12/2020   Procedure: COLONOSCOPY WITH PROPOFOL ;  Surgeon: Legrand Victory LITTIE DOUGLAS, MD;  Location: [facility] ENDOSCOPY;  Service: Gastroenterology;  Laterality: N/A;   CORONARY ANGIOPLASTY WITH STENT PLACEMENT     ENDOVASCULAR STENT INSERTION Bilateral 08/16/2022   Procedure: ENDOVASCULAR STENT GRAFT INSERTION OF BILATERAL RENAL ARTERIES;  Surgeon: [name] ORN, MD;  Location: [facility] OR;  Service: Vascular;  Laterality: Bilateral;   ESOPHAGOGASTRODUODENOSCOPY (EGD) WITH PROPOFOL  N/A 11/12/2020   Procedure: ESOPHAGOGASTRODUODENOSCOPY (EGD) WITH PROPOFOL ;  Surgeon: Legrand Victory LITTIE DOUGLAS, MD;  Location: [facility] ENDOSCOPY;  Service: Gastroenterology;  Laterality: N/A;   ESOPHAGOGASTRODUODENOSCOPY (EGD) WITH PROPOFOL  N/A 03/06/2021   Procedure: ESOPHAGOGASTRODUODENOSCOPY (EGD) WITH PROPOFOL ;  Surgeon: Leigh Elspeth SQUIBB, MD;  Location: [facility] ENDOSCOPY;  Service: Gastroenterology;  Laterality: N/A;   ESOPHAGOGASTRODUODENOSCOPY (EGD) WITH PROPOFOL  N/A 07/25/2021   Procedure: ESOPHAGOGASTRODUODENOSCOPY (EGD) WITH PROPOFOL ;  Surgeon: Stacia Glendia BRAVO, MD;  Location: [facility] ENDOSCOPY;  Service: Gastroenterology;  Laterality: N/A;   ESOPHAGOGASTRODUODENOSCOPY (EGD) WITH PROPOFOL  N/A 12/24/2021   Procedure: ESOPHAGOGASTRODUODENOSCOPY (EGD) WITH PROPOFOL ;  Surgeon: Wilhelmenia Aloha Raddle., MD;  Location: [facility] ENDOSCOPY;  Service: Gastroenterology;  Laterality: N/A;   flexible cystourethroscopy  05/24/06   HEMOSTASIS CLIP PLACEMENT  03/06/2021   Procedure: HEMOSTASIS CLIP PLACEMENT;  Surgeon: Leigh Elspeth SQUIBB, MD;  Location: [facility] ENDOSCOPY;  Service: Gastroenterology;;   HEMOSTASIS CLIP PLACEMENT  12/24/2021   Procedure: HEMOSTASIS CLIP PLACEMENT;  Surgeon: Wilhelmenia Aloha Raddle., MD;  Location: [facility] ENDOSCOPY;  Service: Gastroenterology;;   HOT HEMOSTASIS N/A 03/06/2021   Procedure: HOT HEMOSTASIS (ARGON PLASMA COAGULATION/BICAP);  Surgeon: Leigh Elspeth SQUIBB, MD;  Location: [facility]OSCOPY;  Service: Gastroenterology;  Laterality: N/A;   INGUINAL HERNIA REPAIR  2005   INSERTION OF ILIAC STENT Bilateral 08/16/2022   Procedure: INSERTION OF ILIAC STENT RIGHT EXTERNAL ARTERY, LEFT COMMON ARTERY, AND RIGHT COMMON ARTERY;  Surgeon: [name] ORN, MD;  Location: [facility] OR;  Service: Vascular;  Laterality: Bilateral;   IR CATHETER TUBE CHANGE  07/08/2022   LUNG LOBECTOMY Right 11/07   RUL   MULTIPLE TOOTH EXTRACTIONS     POLYPECTOMY  11/12/2020   Procedure: POLYPECTOMY;  Surgeon: Legrand Victory LITTIE DOUGLAS, MD;  Location: [facility] ENDOSCOPY;  Service: Gastroenterology;;   POLYPECTOMY  12/24/2021   Procedure:  POLYPECTOMY;  Surgeon: Wilhelmenia Aloha Raddle., MD;  Location: [facility]OSCOPY;  Service: Gastroenterology;;   SUBMUCOSAL LIFTING INJECTION  12/24/2021   Procedure: SUBMUCOSAL LIFTING INJECTION;  Surgeon: Wilhelmenia Aloha Raddle., MD;  Location: [facility]COPY;  Service: Gastroenterology;;   TRANSURETHRAL RESECTION OF PROSTATE N/A 05/02/2021   Procedure: TRANSURETHRAL RESECTION OF THE PROSTATE (TURP);  Surgeon: Carolee Sherwood JONETTA DOUGLAS, MD;  Location: [facility] ORS;  Service: Urology;  Laterality: N/A;   ULTRASOUND GUIDANCE FOR VASCULAR ACCESS Bilateral 08/16/2022   Procedure: ULTRASOUND GUIDANCE FOR VASCULAR ACCESS;  Surgeon: [name] ORN, MD;  Location: [facility] OR;  Service: Vascular;  Laterality: Bilateral;   UPPER ESOPHAGEAL ENDOSCOPIC ULTRASOUND (EUS) N/A 12/24/2021   Procedure: UPPER ESOPHAGEAL ENDOSCOPIC ULTRASOUND (EUS);  Surgeon: Wilhelmenia Aloha Raddle., MD;  Location: [facility]COPY;  Service: Gastroenterology;  Laterality: N/A;   UPPER GASTROINTESTINAL ENDOSCOPY     VIDEO BRONCHOSCOPY  07/27/2021   Procedure: VIDEO BRONCHOSCOPY WITHOUT FLUORO;  Surgeon: Shelah Lamar RAMAN, MD;  Location: [facility] ENDOSCOPY;  Service: Cardiopulmonary;;   VIDEO BRONCHOSCOPY Right 05/14/2024   Procedure: VIDEO BRONCHOSCOPY WITHOUT FLUORO;  Surgeon: Jude Harden GAILS, MD;  Location: [facility] ENDOSCOPY;  Service: Cardiopulmonary;  Laterality: Right;    Social:  Lives With: By himself Occupation: Does  not work  Support: Family in the area Level of Function: Independent in all ADLs/IADLs PCP: Dr. Dayton Eastern, DO  Substances: Smokes too many cigarrettes daily, about a pack and a half  Family History:   Family History  Problem Relation Age of Onset   Diabetes Mother    Hypertension Sister    Heart disease Sister    Diabetes Sister    Arthritis Brother    Gout Brother    Colon cancer Neg Hx    Esophageal cancer Neg Hx    Rectal cancer Neg Hx    Stomach cancer Neg Hx    Colon polyps Neg Hx    Inflammatory bowel disease Neg Hx    Liver  disease Neg Hx    Pancreatic cancer Neg Hx      Allergies: Allergies as of 05/26/2024 - Review Complete 05/26/2024  Allergen Reaction Noted   Candesartan cilexetil-hctz Other (See Comments)    Hydrochlorothiazide Other (See Comments)    Shellfish allergy Other (See Comments) 03/24/2013   Betadine [povidone iodine ] Itching 08/14/2022    Review of Systems: A complete ROS was negative except as per HPI.   OBJECTIVE:   Physical Exam: Blood pressure (!) 148/94, pulse (!) 58, temperature (!) 97.4 F (36.3 C), temperature source Axillary, resp. rate 20, SpO2 100%.  Constitutional: well-appearing male sitting in bed, in no acute distress HENT: normocephalic atraumatic, mucous membranes moist Eyes: conjunctiva non-erythematous Neck: supple Cardiovascular: regular rate and rhythm, no m/r/g, +2 pitting edema bilaterally of lower extremities Pulmonary/Chest: normal work of breathing on 3L, lungs with rhonchi in lower and middle lobes Abdominal: soft, non-tender, non-distended MSK: normal bulk and tone Neurological: alert & oriented x 3 Skin: warm and dry Psych: normal mood and affect  Labs: CBC    Component Value Date/Time   WBC 7.7 05/26/2024 0626   RBC 2.62 (L) 05/26/2024 0626   HGB 9.2 (L) 05/26/2024 0803   HGB 9.1 (L) 12/22/2023 1028   HGB 13.7 05/06/2017 0926   HCT 27.0 (L) 05/26/2024 0803   HCT 28.4 (L) 12/22/2023 1028   HCT 41.9 05/06/2017 0926   PLT 65 (L) 05/26/2024 0626   PLT 129 (L) 12/22/2023 1028   MCV 103.8 (H) 05/26/2024 0626   MCV 95 12/22/2023 1028   MCV 100.7 (H) 05/06/2017 0926   MCH 31.3 05/26/2024 0626   MCHC 30.1 05/26/2024 0626   RDW 17.4 (H) 05/26/2024 0626   RDW 15.8 (H) 12/22/2023 1028   RDW 14.5 05/06/2017 0926   LYMPHSABS 0.7 05/24/2024 1023   LYMPHSABS 1.1 09/23/2022 1032   LYMPHSABS 1.0 05/06/2017 0926   MONOABS 0.2 05/24/2024 1023   MONOABS 0.4 05/06/2017 0926   EOSABS 0.1 05/24/2024 1023   EOSABS 0.1 09/23/2022 1032   BASOSABS 0.0  05/24/2024 1023   BASOSABS 0.0 09/23/2022 1032   BASOSABS 0.0 05/06/2017 0926     CMP     Component Value Date/Time   NA 142 05/26/2024 0803   NA 142 12/22/2023 1028   NA 142 05/06/2017 0926   K 3.6 05/26/2024 0803   K 4.0 05/06/2017 0926   CL 108 05/26/2024 0626   CL 101 01/25/2011 0901   CO2 24 05/26/2024 0626   CO2 27 05/06/2017 0926   GLUCOSE 79 05/26/2024 0626   GLUCOSE 108 05/06/2017 0926   GLUCOSE 112 01/25/2011 0901   BUN 22 05/26/2024 0626   BUN 17 12/22/2023 1028   BUN 14.6 05/06/2017 0926   CREATININE 1.72 (H) 05/26/2024 9373  CREATININE 1.1 05/06/2017 0926   CALCIUM  8.0 (L) 05/26/2024 0626   CALCIUM  9.7 05/06/2017 0926   PROT 5.7 (L) 05/26/2024 0626   PROT 6.6 03/15/2024 1549   PROT 7.6 05/06/2017 0926   ALBUMIN  2.7 (L) 05/26/2024 0626   ALBUMIN  3.5 (L) 03/15/2024 1549   ALBUMIN  3.6 05/06/2017 0926   AST 57 (H) 05/26/2024 0626   AST 63 (H) 05/06/2017 0926   ALT 59 (H) 05/26/2024 0626   ALT 43 05/06/2017 0926   ALKPHOS 70 05/26/2024 0626   ALKPHOS 73 05/06/2017 0926   BILITOT 1.1 05/26/2024 0626   BILITOT 0.4 03/15/2024 1549   BILITOT 0.71 05/06/2017 0926   GFRNONAA 40 (L) 05/26/2024 0626   GFRNONAA >89 12/29/2014 0910   GFRAA >60 02/14/2020 1020   GFRAA >89 12/29/2014 0910    Imaging:   Chest X-Ray: Increased Opacity in the Left Lung Base. Right sided volume loss with mediastinal shift to the right. Cardiomegaly with increased central vascular prominence, without overt edema  CT Angio:  1. No CT evidence for acute pulmonary embolus. 2. Enlargement of the pulmonary outflow tract/main pulmonary arteries suggests pulmonary arterial hypertension. 3. Circumferential wall thickening and septal markings are more prominent in both lungs today, raising the question of interstitial edema. 4. Ascending thoracic aorta measures up to 4.2 cm diameter. Recommend annual imaging followup by CTA or MRA. This recommendation follows 2010  ACCF/AHA/AATS/ACR/ASA/SCA/SCAI/SIR/STS/SVM Guidelines for the Diagnosis and Management of Patients with Thoracic Aortic Disease. Circulation. 2010; 121: Z733-z630. Aortic aneurysm NOS (ICD10-I71.9) 5. Trace left pleural effusion with dependent atelectasis in the lung bases. 6. Cholelithiasis. 7. Aortic Atherosclerosis (ICD10-I70.0) and Emphysema (ICD10-J43.9).  EKG: personally reviewed my interpretation is sinus tachycardia.   ASSESSMENT & PLAN:   Assessment & Plan by Problem: Principal Problem:   Acute exacerbation of CHF (congestive heart failure) (HCC)   [name] is a 78 y.o. person living with a history of HFpEF, chronic hypoxic respiratory failure on 3L of O2, pancreatic insufficiency who presented with shortness of breath and admitted for acute on chronic hypoxic respiratory failure on hospital day 0  #Acute on Chronic Hypoxic Respiratory Failure on 3L of Oxygen  #HFpEF (Last Echo 04/2024 with EF of 50-55%) Pt with acute on chronic hypoxic respiratory failure requiring BiPAP, subsequently weaned off fairly quickly, and back on his home oxygen already.  He is volume overloaded on exam, and lung exam reveals rhonchi.  No cough, productive sputum, or wheezes on exam that would make me think a COPD exacerbation at this time.  Imaging without any infiltrates, without fevers less concern for acute infection.  He is on GDMT management, will restart while he was here.  Will also diurese.  Likely in the setting of not having any sort of diuretic therapy until recently.  Plan: - IV Lasix  40 mg twice a day - Strict I's and O's, daily weights - Unclear what his dry weight is, could be around 60 kg, most recent weight is 63.5 - Restart GDMT - Checking for respiratory viral panel  #COPD No wheezes on exam to think he is in an acute exacerbation.  Will restart his home inhalers, and DuoNebs as needed.  #Atrial Fibrillation  Hx of A-Fib, on beta blocker. Previously on Xarelto , stopped  due to increased  with a history of abdominal aortic aneurysm and bleeding event from Dieulafoy lesion   #CKD3a Most recent creatinine around 1.72, baseline creatinine around 2.  Will continue to monitor while inpatient.  #Elevated Liver Enzymes Mildly  elevated today at 57/59 (AST, ALT respectively).  He is taking his statin, and has a history of alcoholism. States he has stopped drinking.  No liver abnormality seen on CT abdomen in July 2025.  Has a history of calcified gallstones, not currently symptomatic.  If remains elevated will check right upper quadrant ultrasound.   #Ascending Thoracic Aorta Aneurysm #Juxtarenal abdominal aortic aneurysm repair with stenting of bilateral renal arteries #CAD  Ascending thoracic aorta about 4.2 cm in diameter, recommend annual follow-up.  Will continue his aspirin  and Plavix .  #Pancreatic Insufficiency  Takes Zenpep  at home, unfortunately not on formulary. Converted from Zenpep  to Creon .   #HLD  Continuing atorvastatin 40mg   #HTN Amlodipine  and metoprolol  continuing  #Chronic Pain  Chronic hip pain, OA of left hip. Will continue his norco BID.     Diet: Normal VTE: Heparin  IVF: None,None Code: Full  Prior to Admission Living Arrangement: Home, living family Anticipated Discharge Location: Home Barriers to Discharge: Medical management  Dispo: Admit patient to Observation with expected length of stay less than 2 midnights.  Signed: Joaopedro Eschbach, MD Internal Medicine Resident PGY-3  05/26/2024, 2:57 PM

## 2024-05-26 NOTE — Progress Notes (Signed)
 Patient removed from bipap and placed on 4L [place].  Tolerating well at this time.  Will continue to monitor and assess for bipap needs.

## 2024-05-26 NOTE — ED Notes (Signed)
 RT at bedside.

## 2024-05-26 NOTE — ED Provider Notes (Signed)
 MSE was initiated and I personally evaluated the patient and placed orders (if any) at  6:54 AM on May 26, 2024.  Here with shortness of breath.  History of CHF, COPD.  Seen several days ago for shortness of breath and discharged home.  Patient appears to be volume overloaded on exam.  Noted to have atrial fibs on telemetry. Shortness of breath workup initiated.  The patient appears stable so that the remainder of the MSE may be completed by another provider.   Trine Raynell Moder, MD 05/26/24 [phone]

## 2024-05-26 NOTE — ED Triage Notes (Signed)
 Pt woke up SOB fire arrived to note his normal 3 liters of oxygen was turned down to 1.5L and he was sating about 83% at that time. Was brought up by non rebreather and transitioned to [facility] via EMS

## 2024-05-26 NOTE — Progress Notes (Signed)
 ED Pharmacy Antibiotic Sign Off An antibiotic consult was received from an ED provider for vancomycin  per pharmacy dosing for PNA. A chart review was completed to assess appropriateness.   The following one time order(s) were placed:  Vancomycin  1500mg   Further antibiotic and/or antibiotic pharmacy consults should be ordered by the admitting provider if indicated.   Thank you for allowing pharmacy to be a part of this patient's care.   Leonor GORMAN Bash, [facility]  Clinical Pharmacist 05/26/24 7:53 AM

## 2024-05-26 NOTE — Progress Notes (Signed)
 RT called to patient room due to patient having a decrease in sats to mid 80s while on 4L.  Upon arrival, patient was noted to have increased work of breathing and RN was beginning nebulizer treatment.  Upon auscultation, patient noted to have coarse crackles.  MD present in patient room with orders to obtain ABG (results below).  Patient was placed on bipap and is tolerating well at this time with improved respiratory status.  Will continue to monitor.    Latest Reference Range & Units 05/26/24 08:03  Sample type  ARTERIAL  pH, Arterial 7.35 - 7.45  7.332 (L)  pCO2 arterial 32 - 48 mmHg 45.0  pO2, Arterial 83 - 108 mmHg 98  TCO2 22 - 32 mmol/L 25  Acid-base deficit 0.0 - 2.0 mmol/L 2.0  Bicarbonate 20.0 - 28.0 mmol/L 24.0  O2 Saturation % 97  Patient temperature  97.7 F  Collection site  RADIAL, ALLEN'S TEST ACCEPTABLE

## 2024-05-26 NOTE — ED Notes (Addendum)
 Phleb to obtain second set of cultures. MD notified.

## 2024-05-26 NOTE — Progress Notes (Signed)
 Heart Failure Navigator Progress Note  Assessed for Heart & Vascular TOC clinic readiness.  Patient does not meet criteria due to has a scheduled [facility] appointment on 06/03/2024. No HF TOC. .   Navigator will sign off at this time.   Stephane Haddock, BSN, Scientist, Clinical (histocompatibility And Immunogenetics) Only

## 2024-05-27 ENCOUNTER — Other Ambulatory Visit (HOSPITAL_COMMUNITY): Payer: Self-pay

## 2024-05-27 ENCOUNTER — Encounter (HOSPITAL_COMMUNITY): Payer: Self-pay

## 2024-05-27 DIAGNOSIS — N179 Acute kidney failure, unspecified: Secondary | ICD-10-CM | POA: Diagnosis present

## 2024-05-27 DIAGNOSIS — I130 Hypertensive heart and chronic kidney disease with heart failure and stage 1 through stage 4 chronic kidney disease, or unspecified chronic kidney disease: Secondary | ICD-10-CM

## 2024-05-27 DIAGNOSIS — Z7982 Long term (current) use of aspirin: Secondary | ICD-10-CM

## 2024-05-27 DIAGNOSIS — D631 Anemia in chronic kidney disease: Secondary | ICD-10-CM | POA: Diagnosis not present

## 2024-05-27 DIAGNOSIS — N1832 Chronic kidney disease, stage 3b: Secondary | ICD-10-CM | POA: Diagnosis present

## 2024-05-27 DIAGNOSIS — Z9359 Other cystostomy status: Secondary | ICD-10-CM | POA: Diagnosis not present

## 2024-05-27 DIAGNOSIS — K8689 Other specified diseases of pancreas: Secondary | ICD-10-CM | POA: Diagnosis present

## 2024-05-27 DIAGNOSIS — I509 Heart failure, unspecified: Secondary | ICD-10-CM | POA: Diagnosis not present

## 2024-05-27 DIAGNOSIS — E785 Hyperlipidemia, unspecified: Secondary | ICD-10-CM | POA: Diagnosis present

## 2024-05-27 DIAGNOSIS — I2510 Atherosclerotic heart disease of native coronary artery without angina pectoris: Secondary | ICD-10-CM | POA: Diagnosis not present

## 2024-05-27 DIAGNOSIS — Z7902 Long term (current) use of antithrombotics/antiplatelets: Secondary | ICD-10-CM | POA: Diagnosis not present

## 2024-05-27 DIAGNOSIS — Z79899 Other long term (current) drug therapy: Secondary | ICD-10-CM | POA: Diagnosis not present

## 2024-05-27 DIAGNOSIS — F17210 Nicotine dependence, cigarettes, uncomplicated: Secondary | ICD-10-CM | POA: Diagnosis present

## 2024-05-27 DIAGNOSIS — I5030 Unspecified diastolic (congestive) heart failure: Secondary | ICD-10-CM

## 2024-05-27 DIAGNOSIS — Z833 Family history of diabetes mellitus: Secondary | ICD-10-CM | POA: Diagnosis not present

## 2024-05-27 DIAGNOSIS — J810 Acute pulmonary edema: Secondary | ICD-10-CM | POA: Diagnosis present

## 2024-05-27 DIAGNOSIS — Z955 Presence of coronary angioplasty implant and graft: Secondary | ICD-10-CM | POA: Diagnosis not present

## 2024-05-27 DIAGNOSIS — L89892 Pressure ulcer of other site, stage 2: Secondary | ICD-10-CM | POA: Diagnosis present

## 2024-05-27 DIAGNOSIS — L8990 Pressure ulcer of unspecified site, unspecified stage: Secondary | ICD-10-CM | POA: Insufficient documentation

## 2024-05-27 DIAGNOSIS — N183 Chronic kidney disease, stage 3 unspecified: Secondary | ICD-10-CM

## 2024-05-27 DIAGNOSIS — Z7952 Long term (current) use of systemic steroids: Secondary | ICD-10-CM | POA: Diagnosis not present

## 2024-05-27 DIAGNOSIS — G8929 Other chronic pain: Secondary | ICD-10-CM | POA: Diagnosis present

## 2024-05-27 DIAGNOSIS — I4891 Unspecified atrial fibrillation: Secondary | ICD-10-CM

## 2024-05-27 DIAGNOSIS — D696 Thrombocytopenia, unspecified: Secondary | ICD-10-CM | POA: Diagnosis present

## 2024-05-27 DIAGNOSIS — J449 Chronic obstructive pulmonary disease, unspecified: Secondary | ICD-10-CM

## 2024-05-27 DIAGNOSIS — I5033 Acute on chronic diastolic (congestive) heart failure: Secondary | ICD-10-CM | POA: Diagnosis present

## 2024-05-27 DIAGNOSIS — R7401 Elevation of levels of liver transaminase levels: Secondary | ICD-10-CM | POA: Diagnosis not present

## 2024-05-27 DIAGNOSIS — Z8249 Family history of ischemic heart disease and other diseases of the circulatory system: Secondary | ICD-10-CM | POA: Diagnosis not present

## 2024-05-27 DIAGNOSIS — Z1152 Encounter for screening for COVID-19: Secondary | ICD-10-CM | POA: Diagnosis not present

## 2024-05-27 DIAGNOSIS — I2489 Other forms of acute ischemic heart disease: Secondary | ICD-10-CM | POA: Diagnosis present

## 2024-05-27 DIAGNOSIS — K761 Chronic passive congestion of liver: Secondary | ICD-10-CM | POA: Diagnosis present

## 2024-05-27 DIAGNOSIS — I7121 Aneurysm of the ascending aorta, without rupture: Secondary | ICD-10-CM | POA: Diagnosis present

## 2024-05-27 DIAGNOSIS — J9621 Acute and chronic respiratory failure with hypoxia: Secondary | ICD-10-CM | POA: Diagnosis present

## 2024-05-27 DIAGNOSIS — D539 Nutritional anemia, unspecified: Secondary | ICD-10-CM | POA: Diagnosis present

## 2024-05-27 LAB — COMPREHENSIVE METABOLIC PANEL WITH GFR
ALT: 46 U/L — ABNORMAL HIGH (ref 0–44)
AST: 32 U/L (ref 15–41)
Albumin: 2.5 g/dL — ABNORMAL LOW (ref 3.5–5.0)
Alkaline Phosphatase: 65 U/L (ref 38–126)
Anion gap: 9 (ref 5–15)
BUN: 28 mg/dL — ABNORMAL HIGH (ref 8–23)
CO2: 26 mmol/L (ref 22–32)
Calcium: 8.1 mg/dL — ABNORMAL LOW (ref 8.9–10.3)
Chloride: 105 mmol/L (ref 98–111)
Creatinine, Ser: 1.81 mg/dL — ABNORMAL HIGH (ref 0.61–1.24)
GFR, Estimated: 38 mL/min — ABNORMAL LOW (ref >60)
Glucose, Bld: 160 mg/dL — ABNORMAL HIGH (ref 70–99)
Potassium: 4.2 mmol/L (ref 3.5–5.1)
Sodium: 140 mmol/L (ref 135–145)
Total Bilirubin: 0.9 mg/dL (ref 0.0–1.2)
Total Protein: 5.5 g/dL — ABNORMAL LOW (ref 6.5–8.1)

## 2024-05-27 LAB — BASIC METABOLIC PANEL WITH GFR
Anion gap: 8 (ref 5–15)
BUN: 28 mg/dL — ABNORMAL HIGH (ref 8–23)
CO2: 25 mmol/L (ref 22–32)
Calcium: 8.1 mg/dL — ABNORMAL LOW (ref 8.9–10.3)
Chloride: 108 mmol/L (ref 98–111)
Creatinine, Ser: 1.82 mg/dL — ABNORMAL HIGH (ref 0.61–1.24)
GFR, Estimated: 38 mL/min — ABNORMAL LOW (ref >60)
Glucose, Bld: 143 mg/dL — ABNORMAL HIGH (ref 70–99)
Potassium: 4.4 mmol/L (ref 3.5–5.1)
Sodium: 141 mmol/L (ref 135–145)

## 2024-05-27 LAB — CBC
HCT: 23.8 % — ABNORMAL LOW (ref 39.0–52.0)
Hemoglobin: 7.3 g/dL — ABNORMAL LOW (ref 13.0–17.0)
MCH: 31.2 pg (ref 26.0–34.0)
MCHC: 30.7 g/dL (ref 30.0–36.0)
MCV: 101.7 fL — ABNORMAL HIGH (ref 80.0–100.0)
Platelets: 55 K/uL — ABNORMAL LOW (ref 150–400)
RBC: 2.34 MIL/uL — ABNORMAL LOW (ref 4.22–5.81)
RDW: 17.3 % — ABNORMAL HIGH (ref 11.5–15.5)
WBC: 5.5 K/uL (ref 4.0–10.5)
nRBC: 0 % (ref 0.0–0.2)

## 2024-05-27 NOTE — Consult Note (Signed)
 WOC Nurse Consult Note: Reason for Consult: Presure injury underside of penis  Wound type: full thickness wound underside of penis; would not attribute this to pressure Pressure Injury POA: NA Measurement: see nursing flow sheets Wound bed: 100% yellow  Drainage (amount, consistency, odor) see nursing flow sheets Periwound: intact Dressing procedure/placement/frequency: Cover affected areas of single layer of xeroform gauze and top with ABD, use mesh underwear to hold in place. Change daily    Re consult if needed, will not follow at this time. Thanks  Keighan [employer], RN,CWOCN, CNS, [employer] [phone]

## 2024-05-27 NOTE — Plan of Care (Signed)

## 2024-05-27 NOTE — Progress Notes (Signed)
 HD#0 SUBJECTIVE:  Patient Summary: [name] is a 78 y.o. with a pertinent PMHx of HFpEF, chronic hypoxic respiratory failure on 3L of O2, pancreatic insufficiency, presented with shortness of breath and admitted for acute on chronic hypoxic respiratory failure   Overnight Events and Interim History:  No overnight events. Found sitting up in bed eating breakfast, not on [place], home [place] 4L.  No complaints. States he is feeling significantly better and has been getting a lot of fluid off since admission.    OBJECTIVE:  Vital Signs: Vitals:   05/27/24 1226 05/27/24 1546 05/27/24 1548 05/27/24 1630  BP: 123/65 116/63 116/63   Pulse: (!) 59 72 78   Resp: (!) 21 16 18    Temp: (!) 97.4 F (36.3 C) 98.4 F (36.9 C) 98.4 F (36.9 C)   TempSrc: Oral Oral Oral   SpO2:  100% 100%   Weight:      Height:    6' (1.829 m)   Supplemental O2: 4LNC SpO2: 100 % O2 Flow Rate (L/min): 4 L/min FiO2 (%): 40 %  Filed Weights   05/27/24 0431  Weight: 63.5 kg     Intake/Output Summary (Last 24 hours) at 05/27/2024 1851 Last data filed at 05/27/2024 1700 Gross per 24 hour  Intake 720 ml  Output 1650 ml  Net -930 ml   Net IO Since Admission: -2,390 mL [05/27/24 1851]     Latest Ref Rng & Units 05/27/2024    5:53 AM 05/26/2024    8:03 AM 05/26/2024    7:40 AM  CBC  WBC 4.0 - 10.5 K/uL 5.5     Hemoglobin 13.0 - 17.0 g/dL 7.3  9.2  8.8   Hematocrit 39.0 - 52.0 % 23.8  27.0  26.0   Platelets 150 - 400 K/uL 55          Latest Ref Rng & Units 05/27/2024   11:11 AM 05/27/2024    2:05 AM 05/26/2024    8:03 AM  BMP  Glucose 70 - 99 mg/dL 839  856    BUN 8 - 23 mg/dL 28  28    Creatinine 9.38 - 1.24 mg/dL 8.18  8.17    Sodium 864 - 145 mmol/L 140  141  142   Potassium 3.5 - 5.1 mmol/L 4.2  4.4  3.6   Chloride 98 - 111 mmol/L 105  108    CO2 22 - 32 mmol/L 26  25    Calcium  8.9 - 10.3 mg/dL 8.1  8.1      Physical Exam: Physical Exam Constitutional:      Appearance: He is  normal weight.  HENT:     Head: Normocephalic and atraumatic.  Eyes:     Pupils: Pupils are equal, round, and reactive to light.  Cardiovascular:     Rate and Rhythm: Normal rate. Rhythm irregular.  Pulmonary:     Breath sounds: Examination of the right-lower field reveals rhonchi. Examination of the left-lower field reveals rhonchi. Rhonchi present.  Abdominal:     General: Bowel sounds are normal.     Palpations: Abdomen is soft.  Musculoskeletal:     Right lower leg: Edema present.     Left lower leg: Edema present.  Skin:    General: Skin is warm.     Capillary Refill: Capillary refill takes less than 2 seconds.  Neurological:     General: No focal deficit present.     Mental Status: He is alert.    Patient Lines/Drains/Airways  Status     Active Line/Drains/Airways     Name Placement date Placement time Site Days   Peripheral IV 05/26/24 20 G Right Antecubital 05/26/24  0610  Antecubital  1   Peripheral IV 05/26/24 20 G Anterior;Left Forearm 05/26/24  0757  Forearm  1   Suprapubic Catheter Latex 18 Fr. 07/08/22  1058  Latex  689   Wound 05/26/24 1815 Pressure Injury Penis Posterior Stage 3 -  Full thickness tissue loss. Subcutaneous fat may be visible but bone, tendon or muscle are NOT exposed. 05/26/24  1815  Penis  1             ASSESSMENT/PLAN:  Assessment: Principal Problem:   Acute exacerbation of CHF (congestive heart failure) (HCC) Active Problems:   Pressure injury of skin   Plan: #Acute on Chronic Hypoxic Respiratory Failure  #Acute decompensated HF #HFpEF (Last Echo 04/2024 with EF of 50-55%) Continue diuresis. BL rhonchi and BLE edema on exam, feels improved with no SOB, chest pain, or cough - IV Lasix  40 mg BID - Entresto  97-103 mg twice daily - Strict Is and Os, daily weights - Respiratory panel pending - Pain control:  - AM Labs: CBC, BMP - PT/OT consult  #Chronic macrocytic anemia #Atrial Fibrillation not on chronic anticoagulation Hbg  7.7, baseline Hgb 10. Platelets 55, baseline 50s-100, asymptomatic. Hx of A-Fib, on beta blocker. Previously on Xarelto , stopped due increased bleeding risk 2/2 abdominal aortic aneurysm and bleeding event from Dieulafoy lesion - Consider holding Heparin  due to low platelets - Daily CBC - If Hgb drops below 7, transfuse PRBCs  #Transaminitis Mildly elevated on admission 57/59 (AST, ALT respectively) 2/2 hepatic congestion.  He is taking his statin, and has a history of alcoholism - Daily CMPs - Consider RUQ u/s if remains elevated  #AKI #CKD3 Cr 1.82, baseline 1.5 - Daily CMPs as above - Continue to monitor  #Chronic Medical Conditions COPD: Brovana  nebulizer solution 15 mcg twice daily, Incruse Ellipta  62.5 mcg/ACT 1 puff daily, DuoNeb 0.5-2.5 mg / 3 mL nebulizer solution every 6 hours CAD: Aspirin  EC tablet 81 mg daily, Plavix  75 mg daily Pancreatic Insufficiency: Home Zenpap not on formulary, start Creon  daily HLD: Continue home atorvastatin 40 mg daily HTN: Continue amlodipine  10 mg daily, metoprolol  25 mg daily Chronic Pain: Norco 7.5-325  Best Practice: Diet: Regular diet IVF: Fluids: None, Rate: None VTE: heparin  injection 5,000 Units Start: 05/26/24 1430 Code: Full AB: S/p 1 dose vancomycin  IV 1500 mg Pain Medicine: Norco 7.5-325 mg 1 tablet twice daily as needed Bowel Regimen:  Therapy Recs: Pending, DME: none Family Contact:  Contact Information     Name Relation Home Work [name]   [phone]      Other Contacts     Name Relation Home Work Mobile   [name] Brother [phone]  [phone]   [name]   [phone]       DISPO: Anticipated discharge in 2-4 days to Home pending clinical improvement.  Signature: [name] Maiden, MD Psychiatry Resident, PGY-1 [name]  Pager: # [phone]. 6:51 PM, 05/27/2024

## 2024-05-27 NOTE — Progress Notes (Signed)
 Mobility Specialist Progress Note:    05/27/24 1027  Mobility  Activity Ambulated with assistance  Level of Assistance Contact guard assist, steadying assist  Assistive Device None  Distance Ambulated (ft) 15 ft  Range of Motion/Exercises Active  Activity Response Tolerated fair;RN notified  Mobility Referral Yes  Mobility visit 1 Mobility  Mobility Specialist Start Time (ACUTE ONLY) 1027  Mobility Specialist Stop Time (ACUTE ONLY) 1040  Mobility Specialist Time Calculation (min) (ACUTE ONLY) 13 min   Received pt laying in bed agreeable to session. No c/o any symptoms in beginning but felt a little fatigued towards the end. Pt able to move and ambulate well. Normally uses a SPC but able to use RW or holds on to things as he's walking. Returned pt to bed w/ all needs met.   Venetia Keel Mobility Specialist Please Neurosurgeon or Rehab Office at [phone]

## 2024-05-28 ENCOUNTER — Ambulatory Visit: Admitting: Student

## 2024-05-28 ENCOUNTER — Inpatient Hospital Stay (HOSPITAL_COMMUNITY)

## 2024-05-28 DIAGNOSIS — I130 Hypertensive heart and chronic kidney disease with heart failure and stage 1 through stage 4 chronic kidney disease, or unspecified chronic kidney disease: Secondary | ICD-10-CM | POA: Diagnosis not present

## 2024-05-28 DIAGNOSIS — J9621 Acute and chronic respiratory failure with hypoxia: Secondary | ICD-10-CM | POA: Diagnosis not present

## 2024-05-28 DIAGNOSIS — J449 Chronic obstructive pulmonary disease, unspecified: Secondary | ICD-10-CM | POA: Diagnosis not present

## 2024-05-28 DIAGNOSIS — I5030 Unspecified diastolic (congestive) heart failure: Secondary | ICD-10-CM | POA: Diagnosis not present

## 2024-05-28 LAB — CBC
HCT: 25.3 % — ABNORMAL LOW (ref 39.0–52.0)
Hemoglobin: 7.7 g/dL — ABNORMAL LOW (ref 13.0–17.0)
MCH: 31 pg (ref 26.0–34.0)
MCHC: 30.4 g/dL (ref 30.0–36.0)
MCV: 102 fL — ABNORMAL HIGH (ref 80.0–100.0)
Platelets: 56 K/uL — ABNORMAL LOW (ref 150–400)
RBC: 2.48 MIL/uL — ABNORMAL LOW (ref 4.22–5.81)
RDW: 17.2 % — ABNORMAL HIGH (ref 11.5–15.5)
WBC: 6.3 K/uL (ref 4.0–10.5)
nRBC: 0.6 % — ABNORMAL HIGH (ref 0.0–0.2)

## 2024-05-28 LAB — COMPREHENSIVE METABOLIC PANEL WITH GFR
ALT: 69 U/L — ABNORMAL HIGH (ref 0–44)
AST: 71 U/L — ABNORMAL HIGH (ref 15–41)
Albumin: 2.5 g/dL — ABNORMAL LOW (ref 3.5–5.0)
Alkaline Phosphatase: 62 U/L (ref 38–126)
Anion gap: 14 (ref 5–15)
BUN: 26 mg/dL — ABNORMAL HIGH (ref 8–23)
CO2: 22 mmol/L (ref 22–32)
Calcium: 8.3 mg/dL — ABNORMAL LOW (ref 8.9–10.3)
Chloride: 105 mmol/L (ref 98–111)
Creatinine, Ser: 1.8 mg/dL — ABNORMAL HIGH (ref 0.61–1.24)
GFR, Estimated: 38 mL/min — ABNORMAL LOW (ref >60)
Glucose, Bld: 109 mg/dL — ABNORMAL HIGH (ref 70–99)
Potassium: 3.7 mmol/L (ref 3.5–5.1)
Sodium: 141 mmol/L (ref 135–145)
Total Bilirubin: 0.9 mg/dL (ref 0.0–1.2)
Total Protein: 5.5 g/dL — ABNORMAL LOW (ref 6.5–8.1)

## 2024-05-28 LAB — RESPIRATORY PANEL BY PCR

## 2024-05-28 LAB — MAGNESIUM: Magnesium: 1.8 mg/dL (ref 1.7–2.4)

## 2024-05-28 MED ORDER — IPRATROPIUM-ALBUTEROL 0.5-2.5 (3) MG/3ML IN SOLN
3.0000 mL | Freq: Two times a day (BID) | RESPIRATORY_TRACT | Status: DC
  Administered 2024-05-28 – 2024-05-29 (×2): 3 mL via RESPIRATORY_TRACT
  Filled 2024-05-28 (×2): qty 3

## 2024-05-28 MED ORDER — MAGNESIUM SULFATE 2 GM/50ML IV SOLN
2.0000 g | Freq: Once | INTRAVENOUS | Status: AC
  Administered 2024-05-28: 2 g via INTRAVENOUS
  Filled 2024-05-28: qty 50

## 2024-05-28 MED ORDER — FUROSEMIDE 40 MG PO TABS
40.0000 mg | ORAL_TABLET | Freq: Every day | ORAL | Status: DC
  Administered 2024-05-29: 40 mg via ORAL
  Filled 2024-05-28: qty 1

## 2024-05-28 NOTE — Progress Notes (Signed)
 Transported off unit to Ultrasound.  NPO after 12:30 PM this afternoon to prepare for exam.  Transported via bed.  Escorted by: Patient Transporter

## 2024-05-28 NOTE — Progress Notes (Signed)
 Mobility Specialist Progress Note:    05/28/24 0930  Mobility  Activity Ambulated with assistance  Level of Assistance Contact guard assist, steadying assist  Assistive Device None  Distance Ambulated (ft) 15 ft  Range of Motion/Exercises Active  Activity Response Tolerated fair  Mobility Referral Yes  Mobility visit 1 Mobility  Mobility Specialist Start Time (ACUTE ONLY) 0930  Mobility Specialist Stop Time (ACUTE ONLY) 0949  Mobility Specialist Time Calculation (min) (ACUTE ONLY) 19 min   Received pt in room agreeable to session. Pt needed to be cleaned from [facility] first. Pt able to tolerate standing for several mins while getting cared for. Pt able to move and ambulate decently requiring HHA CGA. Returned pt to bed w/ all needs met.   Venetia Keel Mobility Specialist Please Neurosurgeon or Rehab Office at [phone]

## 2024-05-28 NOTE — Progress Notes (Addendum)
 HD#1 SUBJECTIVE:  Patient Summary: [name] is a 78 y.o. with a pertinent PMHx of HFpEF, chronic hypoxic respiratory failure on 3L of O2, pancreatic insufficiency, presented with shortness of breath and admitted for acute on chronic hypoxic respiratory failure   Overnight Events and Interim History:  No overnight events. Laying in bed on 4L [place], denies chest pain, SOB, and cough. No complaints, feels that he is removing a lot of fluid. States he was able to sleep flat prior to admission   OBJECTIVE:  Vital Signs: Vitals:   05/28/24 0432 05/28/24 0500 05/28/24 0819 05/28/24 0826  BP: (!) 145/77  112/61   Pulse: (!) 56  72 77  Resp: 16 16 15 16   Temp: 98.5 F (36.9 C)  98.6 F (37 C)   TempSrc: Oral  Oral   SpO2: 100%   100%  Weight:  63.7 kg    Height:       Supplemental O2: SpO2: 100 % O2 Flow Rate (L/min): (S) 3 L/min FiO2 (%): 40 %  Filed Weights   05/27/24 0431 05/28/24 0500  Weight: 63.5 kg 63.7 kg     Intake/Output Summary (Last 24 hours) at 05/28/2024 0840 Last data filed at 05/28/2024 0700 Gross per 24 hour  Intake 1200 ml  Output 4000 ml  Net -2800 ml   Net IO Since Admission: -4,260 mL [05/28/24 0840]     Latest Ref Rng & Units 05/27/2024    5:53 AM 05/26/2024    8:03 AM 05/26/2024    7:40 AM  CBC  WBC 4.0 - 10.5 K/uL 5.5     Hemoglobin 13.0 - 17.0 g/dL 7.3  9.2  8.8   Hematocrit 39.0 - 52.0 % 23.8  27.0  26.0   Platelets 150 - 400 K/uL 55          Latest Ref Rng & Units 05/27/2024   11:11 AM 05/27/2024    2:05 AM 05/26/2024    8:03 AM  BMP  Glucose 70 - 99 mg/dL 839  856    BUN 8 - 23 mg/dL 28  28    Creatinine 9.38 - 1.24 mg/dL 8.18  8.17    Sodium 864 - 145 mmol/L 140  141  142   Potassium 3.5 - 5.1 mmol/L 4.2  4.4  3.6   Chloride 98 - 111 mmol/L 105  108    CO2 22 - 32 mmol/L 26  25    Calcium  8.9 - 10.3 mg/dL 8.1  8.1      Physical Exam: Physical Exam Constitutional:      Appearance: He is normal weight.  HENT:      Head: Normocephalic and atraumatic.  Eyes:     Pupils: Pupils are equal, round, and reactive to light.  Cardiovascular:     Rate and Rhythm: Normal rate. Rhythm irregular.  Pulmonary:     Effort: Pulmonary effort is normal.     Breath sounds: Examination of the right-lower field reveals rales. Examination of the left-lower field reveals rales. Rales present. No rhonchi.     Comments: On 4L [place], trace rales in bases Abdominal:     General: Bowel sounds are normal.     Palpations: Abdomen is soft.  Musculoskeletal:     Right lower leg: Edema present.     Left lower leg: Edema present.  Skin:    General: Skin is warm.     Capillary Refill: Capillary refill takes less than 2 seconds.  Neurological:  General: No focal deficit present.     Mental Status: He is alert.    Patient Lines/Drains/Airways Status     Active Line/Drains/Airways     Name Placement date Placement time Site Days   Peripheral IV 05/26/24 20 G Right Antecubital 05/26/24  0610  Antecubital  2   Peripheral IV 05/26/24 20 G Anterior;Left Forearm 05/26/24  0757  Forearm  2   Suprapubic Catheter Latex 18 Fr. 07/08/22  1058  Latex  690   Wound 05/26/24 1815 Pressure Injury Penis Posterior Stage 3 -  Full thickness tissue loss. Subcutaneous fat may be visible but bone, tendon or muscle are NOT exposed. 05/26/24  1815  Penis  2             ASSESSMENT/PLAN:  Assessment: Principal Problem:   Acute exacerbation of CHF (congestive heart failure) (HCC) Active Problems:   Pressure injury of skin   Plan: #Acute on Chronic Hypoxic Respiratory Failure  #Acute decompensated HF #HFpEF (Last Echo 04/2024 with EF of 50-55%)  #Demand Ischemia Mag 1.8, will replete. Continue IV Lasix  today with plans to transition to home regimen PO Lasix  40 mg daily tomorrow, with plans to discharge tomorrow. Rales in lower lung fields with BLE edema, RLE > LLE now to knees on RLE, continues to feel improved with no SOB, chest pain, or  cough - IV Lasix  40 mg BID, last dose today 1800 - IV Magnesium  2 g - PO Lasix  40 mg daily starting tomorrow - Entresto  97-103 mg twice daily - Strict Is and Os, daily weights - Respiratory panel pending - Pain control:  - AM Labs: CBC, BMP - PT/OT consult  #Chronic macrocytic anemia #Chronic thrombocytopenia #Atrial Fibrillation not on chronic anticoagulation Hgb improving, 7.7, baseline Hgb 10. Platelets 56, baseline 50s-100, asymptomatic. Hx of A-Fib, on beta blocker. Previously on Xarelto , stopped due increased bleeding risk 2/2 abdominal aortic aneurysm and bleeding event from Dieulafoy lesion - Hold heparin  if plts <50  - Daily CBC - If Hgb drops below 7, transfuse PRBCs  #Transaminitis Mildly elevated on admission 57/59 (AST, ALT respectively) 2/2 hepatic congestion.  He is taking his statin, and has a history of alcoholism - Daily CMPs - Consider RUQ u/s if remains elevated  #AKI #CKD3 Cr 1.82, baseline 1.5 - Daily CMPs as above - Continue to monitor  #Chronic Medical Conditions COPD: Brovana  nebulizer solution 15 mcg twice daily, Incruse Ellipta  62.5 mcg/ACT 1 puff daily, DuoNeb 0.5-2.5 mg / 3 mL nebulizer solution every 6 hours CAD: Aspirin  EC tablet 81 mg daily, Plavix  75 mg daily Pancreatic Insufficiency: Home Zenpap not on formulary, start Creon  daily HLD: Continue home atorvastatin 40 mg daily HTN: Continue amlodipine  10 mg daily, metoprolol  25 mg daily Chronic Pain: Norco 7.5-325 Stage III pressure injury  Best Practice: Diet: Regular diet IVF: Fluids: None, Rate: None VTE: heparin  injection 5,000 Units Start: 05/26/24 1430 Code: Full AB: S/p 1 dose vancomycin  IV 1500 mg Pain Medicine: Norco 7.5-325 mg 1 tablet twice daily as needed Bowel Regimen:  Therapy Recs: Pending, DME: none Family Contact:  Contact Information     Name Relation Home Work [place] Sister   [phone]      Other Contacts     Name Relation Home Work Mobile    [place] Brother [phone]  [phone]   [name]   [phone]       DISPO: Anticipated discharge in 2-4 days to Home pending clinical improvement.  Signature: Alan Maiden, MD Psychiatry  Resident, PGY-1 [facility]  Pager: # [phone]. 8:40 AM, 05/28/2024

## 2024-05-28 NOTE — Discharge Instructions (Addendum)
 Dear [name] Her,  Thank you for allowing us  to participate in your care. You were hospitalized for shortness of breath and were diagnosed with an acute exacerbation of congestive heart failure, which led to low oxygen levels. You were treated with noninvasive breathing support and intravenous diuretics (Lasix ) to remove excess fluid, and your breathing and swelling improved during your stay.  You have a hospital follow up appointment on 12/23 at 1:45 in the internal medicine clinic with Dr. Nguyen. If you need to change this appointment, call [phone]. A hospital follow up visit is a great way to prevent rehospitalization.  MEDICATION CHANGES - None. Continue taking your lasix  pill daily (40mg  dose).  POST-HOSPITAL & CARE INSTRUCTIONS Please call to arrange follow up with your primary care physician in 1 week.  Go to your follow up appointments (listed below) Weigh yourself daily and contact your doctor if you gain more than 2 pounds in one day or 5 pounds in one week. Continue using your home oxygen as prescribed. Seek immediate medical attention for worsening shortness of breath, chest pain, dizziness, or swelling.  DOCTOR'S APPOINTMENT   Future Appointments  Date Time Provider Department Center  06/01/2024 10:00 AM Homsher, Wanda, RN CHL-POP[facility] None  06/03/2024  9:40 AM [name], PA-C CVD-MAGST H&V  07/26/2024  9:00 AM HVC-VASC 1 HVC-ULTRA H&V  07/26/2024  9:40 AM [name] ORN, MD VVS-HVCVS H&V  12/01/2024 10:30 AM IMP-IMCR ANNUAL WELLNESS VISIT IMP-IMCR [address]   Take care and be well!  Internal Medicine Teaching Service Inpatient Team   [facility]  [address] [place], [place] [address] [phone]

## 2024-05-28 NOTE — TOC Initial Note (Signed)
 Transition of Care [facility]) - Initial/Assessment Note    Patient Details  Name: [name] MRN: [id number] Date of Birth: 1945/07/22  Transition of [facility]) CM/SW Contact:    Waddell Barnie Rama, RN Phone Number: 05/28/2024, 1:13 PM  Clinical Narrative:                 From home alone,  has PCP and insurance on file, states has  [facility] services  with [facility] in place at this time,  This NCM confirmed with Burnard, she states he is active with HHPT, and HHRN, has walker/cane, home oxygen 3 liters he thinks it is with [employer]  at home.  States family member ( sister or brother) will transport them home at [employer] and family is support system, states [facility] [address].  Pta self ambulatory with walker/cane. Will need resumption orders for HHRN, HHPT.  Expected Discharge Plan: Home w Home Health Services Barriers to Discharge: Continued Medical Work up   Patient Goals and CMS Choice Patient states their goals for this hospitalization and ongoing recovery are:: return home CMS Medicare.gov Compare Post Acute Care list provided to:: Patient Choice offered to / list presented to : Patient      Expected Discharge Plan and Services In-house Referral: NA Discharge Planning Services: CM Consult Post Acute Care Choice: Home Health Living arrangements for the past 2 months: Single Family Home                 DME Arranged: N/A DME Agency: NA       HH Arranged: NA          Prior Living Arrangements/Services Living arrangements for the past 2 months: Single Family Home Lives with:: Self Patient language and need for interpreter reviewed:: Yes Do you feel safe going back to the place where you live?: Yes      Need for Family Participation in Patient Care: Yes (Comment) Care giver support system in place?: No (comment) Current home services: DME (walker/cane, home oxygen 3 liters- [employer]) Criminal Activity/Legal Involvement Pertinent to Current  Situation/Hospitalization: No - Comment as needed  Activities of Daily Living   ADL Screening (condition at time of admission) Independently performs ADLs?: Yes (appropriate for developmental age) Is the patient deaf or have difficulty hearing?: No Does the patient have difficulty seeing, even when wearing glasses/contacts?: No Does the patient have difficulty concentrating, remembering, or making decisions?: No  Permission Sought/Granted Permission sought to share information with : Case Manager Permission granted to share information with : Yes, Verbal Permission Granted     Permission granted to share info w AGENCY: HH        Emotional Assessment Appearance:: Appears stated age Attitude/Demeanor/Rapport: Engaged Affect (typically observed): Appropriate Orientation: : Oriented to Self, Oriented to Place, Oriented to  Time, Oriented to Situation Alcohol / Substance Use: Not Applicable Psych Involvement: No (comment)  Admission diagnosis:  Acute pulmonary edema (HCC) [J81.0] Hypoxia [R09.02] COPD exacerbation (HCC) [J44.1] Acute exacerbation of CHF (congestive heart failure) (HCC) [I50.9] Patient Active Problem List   Diagnosis Date Noted   Pressure injury of skin 05/27/2024   Acute exacerbation of CHF (congestive heart failure) (HCC) 05/26/2024   Atelectasis, right 05/14/2024   Postobstructive pneumonia 05/12/2024   COPD exacerbation (HCC) 05/12/2024   Acute hypoxemic respiratory failure (HCC) 05/07/2024   Acute on chronic hypoxic respiratory failure (HCC) 03/26/2024   Suprapubic catheter (HCC) 12/22/2023   Thrombocytopenia 12/22/2023   Chronic congestive heart failure (HCC) 06/02/2023  Acute pulmonary edema (HCC) 06/01/2023   S/P AAA repair 08/16/2022   Emphysema lung (HCC) 03/05/2022   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   Anemia    Atrial fibrillation (HCC) 03/05/2021   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    Mild protein-calorie malnutrition 11/07/2020   Acute kidney injury superimposed on CKD 11/06/2020   Urinary retention 04/02/2018   Mild alcohol use disorder 12/09/2016   Premature atrial contractions 07/31/2015   Chronic use of opiate for therapeutic purpose 05/01/2015   Atherosclerosis of aorta (HCC) 01/13/2015   Prediabetes 12/30/2014   AAA (abdominal aortic aneurysm)    Healthcare maintenance 06/30/2012   ASCVD (arteriosclerotic cardiovascular disease) 03/17/2012   Erectile dysfunction 10/12/2010   Arthropathy of pelvic region and thigh 05/02/2008   CKD (chronic kidney disease) stage 2, GFR 60-89 ml/min 08/07/2007   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 [place] [address] Phone: [phone] Fax: [phone]     Social Drivers of Health (SDOH) Social History: SDOH Screenings   Food Insecurity: No Food Insecurity (05/27/2024)  Housing: Low Risk (05/27/2024)  Transportation Needs: No Transportation Needs (05/27/2024)  Utilities: Not At Risk (05/27/2024)  Alcohol Screen: Low Risk (11/26/2023)  Depression (PHQ2-9): Low Risk (04/26/2024)  Financial Resource Strain: Low Risk (11/26/2023)  Physical Activity: Inactive (11/26/2023)  Social Connections: Moderately Isolated (05/27/2024)  Stress: No Stress Concern Present (11/26/2023)  Tobacco Use: High Risk (05/27/2024)  Health Literacy: Adequate Health Literacy (11/26/2023)   SDOH Interventions:     Readmission Risk Interventions    05/28/2024    1:06 PM 05/10/2024    4:29 PM 12/08/2023   11:19 AM  Readmission Risk Prevention Plan   Transportation Screening Complete Complete Complete  PCP or Specialist Appt within 3-5 Days  Complete Complete  HRI or Home Care Consult  Complete Complete  Social Work Consult for Recovery Care Planning/Counseling  Complete Complete  Palliative Care Screening  Not Applicable Not Applicable  Medication Review Oceanographer) Complete Complete Complete  PCP or Specialist appointment within 3-5 days of discharge Complete    HRI or Home Care Consult Complete    Palliative Care Screening Not Applicable    Skilled Nursing Facility Not Applicable

## 2024-05-28 NOTE — Plan of Care (Signed)
 Alert and oriented.  NPO since 12:30PM for ultrasound.  Continued with diuresing.   Problem: Education: Goal: Knowledge of General Education information will improve Description: Including pain rating scale, medication(s)/side effects and non-pharmacologic comfort measures Outcome: Progressing   Problem: Health Behavior/Discharge Planning: Goal: Ability to manage health-related needs will improve Outcome: Progressing   Problem: Clinical Measurements: Goal: Ability to maintain clinical measurements within normal limits will improve Outcome: Progressing Goal: Will remain free from infection Outcome: Progressing Goal: Diagnostic test results will improve Outcome: Progressing Goal: Respiratory complications will improve Outcome: Progressing Goal: Cardiovascular complication will be avoided Outcome: Progressing   Problem: Activity: Goal: Risk for activity intolerance will decrease Outcome: Progressing   Problem: Nutrition: Goal: Adequate nutrition will be maintained Outcome: Progressing   Problem: Coping: Goal: Level of anxiety will decrease Outcome: Progressing   Problem: Elimination: Goal: Will not experience complications related to bowel motility Outcome: Progressing Goal: Will not experience complications related to urinary retention Outcome: Progressing   Problem: Pain Managment: Goal: General experience of comfort will improve and/or be controlled Outcome: Progressing   Problem: Safety: Goal: Ability to remain free from injury will improve Outcome: Progressing   Problem: Skin Integrity: Goal: Risk for impaired skin integrity will decrease Outcome: Progressing

## 2024-05-29 ENCOUNTER — Other Ambulatory Visit (HOSPITAL_COMMUNITY): Payer: Self-pay

## 2024-05-29 LAB — BASIC METABOLIC PANEL WITH GFR
Anion gap: 5 (ref 5–15)
BUN: 24 mg/dL — ABNORMAL HIGH (ref 8–23)
CO2: 35 mmol/L — ABNORMAL HIGH (ref 22–32)
Calcium: 8.1 mg/dL — ABNORMAL LOW (ref 8.9–10.3)
Chloride: 97 mmol/L — ABNORMAL LOW (ref 98–111)
Creatinine, Ser: 1.71 mg/dL — ABNORMAL HIGH (ref 0.61–1.24)
GFR, Estimated: 40 mL/min — ABNORMAL LOW (ref >60)
Glucose, Bld: 83 mg/dL (ref 70–99)
Potassium: 3.8 mmol/L (ref 3.5–5.1)
Sodium: 137 mmol/L (ref 135–145)

## 2024-05-29 LAB — MAGNESIUM: Magnesium: 2 mg/dL (ref 1.7–2.4)

## 2024-05-29 MED ORDER — FUROSEMIDE 40 MG PO TABS
40.0000 mg | ORAL_TABLET | Freq: Every day | ORAL | 0 refills | Status: AC
  Filled 2024-05-29: qty 30, 30d supply, fill #0

## 2024-05-29 NOTE — Discharge Summary (Signed)
 Name: [name] MRN: [id number] DOB: 1946-05-28 78 y.o. PCP: [name], DO  Date of Admission: 05/26/2024  6:09 AM Date of Discharge:  05/29/24 Attending Physician: Dr. Francesco  DISCHARGE DIAGNOSIS:  Primary Problem: Acute exacerbation of CHF (congestive heart failure) [facility])   Hospital Problems: Principal Problem:   Acute exacerbation of CHF (congestive heart failure) (HCC) Active Problems:   Pressure injury of skin   DISCHARGE MEDICATIONS:   Allergies as of 05/29/2024       Reactions   Candesartan Cilexetil-hctz Other (See Comments)   Acute renal failure   Hydrochlorothiazide Other (See Comments)   Acute renal failure   Shellfish Allergy Other (See Comments)   Causes GOUT   Betadine [povidone Iodine ] Itching        Medication List     STOP taking these medications    amoxicillin -clavulanate 500-125 MG tablet Commonly known as: Augmentin        TAKE these medications    acetaminophen  500 MG tablet Commonly known as: TYLENOL  Take 500 mg by mouth every 6 (six) hours as needed for moderate pain (pain score 4-6).   albuterol  108 (90 Base) MCG/ACT inhaler Commonly known as: VENTOLIN  HFA Inhale 1-2 puffs into the lungs every 6 (six) hours as needed for wheezing or shortness of breath.   amLODipine  10 MG tablet Commonly known as: NORVASC  Take 1 tablet (10 mg total) by mouth daily.   aspirin  EC 81 MG tablet Take 1 tablet (81 mg total) by mouth daily. Swallow whole.   clopidogrel  75 MG tablet Commonly known as: PLAVIX  Take 1 tablet (75 mg total) by mouth daily.   escitalopram  5 MG tablet Commonly known as: LEXAPRO  TAKE 1 TABLET (5 MG TOTAL) BY MOUTH DAILY.   furosemide  40 MG tablet Commonly known as: Lasix  Take 1 tablet (40 mg total) by mouth daily.   HYDROcodone -acetaminophen  7.5-325 MG tablet Commonly known as: NORCO Take 1 tablet by mouth 2 (two) times daily as needed for moderate pain (pain score 4-6). Fill 30 days after last fill.    metoprolol  succinate 25 MG 24 hr tablet Commonly known as: TOPROL -XL Take 1 tablet (25 mg total) by mouth daily.   mirtazapine  7.5 MG tablet Commonly known as: REMERON  Take 2 tablets (15 mg total) by mouth at bedtime.   pantoprazole  40 MG tablet Commonly known as: PROTONIX  TAKE 1 TABLET EVERY DAY FOR REFLUX   rosuvastatin  40 MG tablet Commonly known as: CRESTOR  Take 1 tablet (40 mg total) by mouth daily.   sacubitril -valsartan  97-103 MG Commonly known as: ENTRESTO  Take 1 tablet by mouth 2 (two) times daily.   senna 8.6 MG Tabs tablet Commonly known as: SENOKOT Take 1 tablet (8.6 mg total) by mouth in the morning and at bedtime.   Stiolto Respimat  2.5-2.5 MCG/ACT Aers Generic drug: Tiotropium Bromide-Olodaterol Inhale 2 puffs into the lungs daily.   Zenpep  20000-63000 units Cpep Generic drug: Pancrelipase  (Lip-Prot-Amyl) Take 3 capsules by mouth 3 (three) times daily before meals.        DISPOSITION AND FOLLOW-UP:  [name] was discharged from [facility] in stable condition. At the hospital follow up visit please address:  He was hospitalized for acute HFpEF. He was treated with IV lasix  for 3 days and 7L of fluid was removed. His discharge weight is 61.6 kg, or 135lbs. He discharge lasix  regimen is 40mg  daily. He was stable for much of his hospitalization on 3L [place], which is his pre-hospital baseline.  Follow-up Recommendations:  Labs: CMP (for Cr and mild transaminitis in hospital) Medications: Be sure he has resumed lasix  40mg  daily. Assess weight and swelling and renal function and adjust dose if indicated.  Follow-up Appointments:  Follow-up Information     Leontine Lapine, MD Follow up on 06/08/2024.   Specialty: Internal Medicine Why: 1:45  for hospital follow up Contact information: [address] [place] [address] [phone]         [facility]) Follow up.   Specialty: Home  Health Services Why: Agency will call you to set up apt times Contact information: [address] [place]  [phone]                HOSPITAL COURSE:  Patient Summary: Acute on chronic hypoxic respiratory failure Acute decompensated heart failure Demand ischemia Presented to [facility] with acute SOB and hypoxia requiring BiPAP. He was rapidly weaned from noninvasive ventilation and transitioned back to [place], ultimately returning to near-baseline oxygen requirements. Labs and imaging were most consistent with hypoxic respiratory failure 2/2 acute decompensated heart failure. Chest imaging demonstrated cardiomegaly with vascular congestion and interstitial edema without evidence of pneumonia or pulmonary embolism. There was no clinical concern for COPD exacerbation given the absence of wheezing, cough, or sputum production.  He was treated with aggressive IV diuresis using IV furosemide  with strict Is/Os and daily weights, resulting in significant symptomatic improvement and reduction in lower-extremity edema. GDMT was resumed, including Entresto  and beta-blocker therapy. Mild troponin elevation was attributed to demand ischemia in the setting of volume overload and hypoxia, without ischemic symptoms or EKG changes suggestive of acute coronary syndrome. He remained hemodynamically stable throughout admission. The patient was transitioned to oral diuretics prior to discharge with continued improvement.   AKI on CKD stage 3 On admission, the patient demonstrated a mild acute kidney injury on chronic kidney disease, likely cardiorenal in etiology in the setting of volume overload and diuresis. Creatinine increased slightly during aggressive diuresis but remained near baseline and stabilized with ongoing monitoring. Renal function was closely followed with daily labs, and nephrotoxic agents were avoided. Kidney function was acceptable at time of  discharge.  Transaminitis The patient was noted to have mild transaminase elevations on admission. These findings were attributed to hepatic congestion. Liver enzymes remained stable to improving during hospitalization. There was no evidence of biliary obstruction or acute hepatic pathology, and no right upper quadrant pain or systemic symptoms. A RUQ US  showed cholelithiasis without cholecystitis and trace perihepatic ascites. Continued outpatient monitoring is recommended.  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 Home inhaler therapy was continued with PRN bronchodilators as needed. His atrial fibrillation remained rate-controlled on beta-blocker therapy. Anticoagulation was not resumed due to a history of significant bleeding risk, including prior gastrointestinal bleeding, abdominal aortic aneurysm repair, and chronic thrombocytopenia. Coronary artery disease was managed with continuation of aspirin , clopidogrel , and statin therapy. There were no ischemic symptoms during admission.  Chronic macrocytic anemia Chronic thrombocytopenia The patients chronic macrocytic anemia and thrombocytopenia were monitored closely throughout admission. Hemoglobin remained stable without evidence of active bleeding, and platelet counts were consistent with baseline values. No transfusions were required. Anticoagulation and pharmacologic DVT prophylaxis were held when platelet counts approached threshold levels. Daily CBCs were monitored without acute intervention  needed.   Other chronic conditions were medically managed with home medications and formulary alternatives as necessary (Stage II pressure wound injury, urinary retention s/p chronic suprapubic  catheter (SPC), History of Stage IB NSCLC s/p RUL lobectomy (2007)).   DISCHARGE INSTRUCTIONS:   Discharge Instructions     Call MD for:  difficulty breathing, headache or visual disturbances   Complete by: As directed    Call MD for:  extreme fatigue   Complete by: As directed    Call MD for:  persistant dizziness or light-headedness   Complete by: As directed    Call MD for:  persistant nausea and vomiting   Complete by: As directed    Increase activity slowly   Complete by: As directed    No wound care   Complete by: As directed        SUBJECTIVE:  He feels well this morning and would like to go home. He has no dyspnea, CP, palpitations, and can tell that his swelling is improved. He has no concerns about going home. He knows to plan a hospital follow up visit at the [facility] Discharge Vitals:   BP 109/65 (BP Location: Right Arm)   Pulse 78   Temp 98.3 F (36.8 C) (Oral)   Resp 19   Ht 6' (1.829 m)   Wt 61.6 kg   SpO2 100%   BMI 18.42 kg/m   OBJECTIVE:  Physical Exam Constitutional:      General: He is not in acute distress.    Appearance: He is well-developed. He is not ill-appearing.     Comments: Chronically ill-appearing  Neck:     Vascular: No JVD.  Cardiovascular:     Rate and Rhythm: Normal rate and regular rhythm.     Heart sounds: Normal heart sounds.  Pulmonary:     Effort: Pulmonary effort is normal.     Breath sounds: Normal breath sounds. No decreased breath sounds, wheezing or rhonchi.  Chest:     Chest wall: No tenderness.  Abdominal:     Palpations: Abdomen is soft.     Tenderness: There is no abdominal tenderness.  Musculoskeletal:     Right lower leg: No tenderness. Edema present.     Left lower leg: No tenderness. Edema present.     Comments: Trace LE edema in distal third of lower leg/ankles  Skin:    General: Skin is warm and dry.  Neurological:     General: No focal deficit present.     Mental Status: He is alert.  Psychiatric:         Mood and Affect: Mood normal.        Behavior: Behavior normal.     Pertinent Labs, Studies, and Procedures:     Latest Ref Rng & Units 05/28/2024    8:20 AM 05/27/2024    5:53 AM 05/26/2024    8:03 AM  CBC  WBC 4.0 - 10.5 K/uL 6.3  5.5    Hemoglobin 13.0 - 17.0 g/dL 7.7  7.3  9.2   Hematocrit 39.0 - 52.0 % 25.3  23.8  27.0   Platelets 150 - 400 K/uL 56  55         Latest Ref Rng & Units 05/29/2024    6:48 AM 05/28/2024    8:20 AM 05/27/2024   11:11 AM  CMP  Glucose 70 - 99 mg/dL 83  890  839   BUN 8 - 23 mg/dL 24  26  28    Creatinine 0.61 -  1.24 mg/dL 8.28  8.19  8.18   Sodium 135 - 145 mmol/L 137  141  140   Potassium 3.5 - 5.1 mmol/L 3.8  3.7  4.2   Chloride 98 - 111 mmol/L 97  105  105   CO2 22 - 32 mmol/L 35  22  26   Calcium  8.9 - 10.3 mg/dL 8.1  8.3  8.1   Total Protein 6.5 - 8.1 g/dL  5.5  5.5   Total Bilirubin 0.0 - 1.2 mg/dL  0.9  0.9   Alkaline Phos 38 - 126 U/L  62  65   AST 15 - 41 U/L  71  32   ALT 0 - 44 U/L  69  46     CT Angio Chest PE W and/or Wo Contrast Result Date: 05/26/2024 CLINICAL DATA:  Decreased O2 sats. Increased work of breathing. Evaluate for pulmonary embolus. EXAM: CT ANGIOGRAPHY CHEST WITH CONTRAST TECHNIQUE: Multidetector CT imaging of the chest was performed using the standard protocol during bolus administration of intravenous contrast. Multiplanar CT image reconstructions and MIPs were obtained to evaluate the vascular anatomy. RADIATION DOSE REDUCTION: This exam was performed according to the departmental dose-optimization program which includes automated exposure control, adjustment of the mA and/or kV according to patient size and/or use of iterative reconstruction technique. CONTRAST:  75mL OMNIPAQUE  IOHEXOL  350 MG/ML SOLN COMPARISON:  05/10/2024 FINDINGS: Cardiovascular: Heart size upper normal. Coronary artery calcification is evident. Moderate atherosclerotic calcification is noted in the wall of the thoracic aorta. Ascending  thoracic aorta measures up to 4.2 cm diameter. Enlargement of the pulmonary outflow tract/main pulmonary arteries suggests pulmonary arterial hypertension. There is no filling defect within the opacified pulmonary arteries to suggest the presence of an acute pulmonary embolus. Mediastinum/Nodes: No mediastinal lymphadenopathy. There is no hilar lymphadenopathy. Surgical changes are noted in the right hilum compatible with right upper lobectomy. Lungs/Pleura: Volume loss in the right hemithorax is similar. Centrilobular and paraseptal emphysema evident. Circumferential wall thickening and septal markings are more prominent in both lungs today, raising the question of interstitial edema. No dense focal airspace consolidation. Trace left pleural effusion evident with dependent atelectasis in the lung bases. Upper Abdomen: Gallbladder is distended with calcified stone evident. Right upper pole renal cyst incompletely visualized. Patient is status post abdominal aortic stent graft placement. 2.1 cm exophytic lesion with increased attenuation in the upper pole left kidney is stable comparing back to 01/31/2022 exam compatible with benign etiology. Musculoskeletal: No worrisome lytic or sclerotic osseous abnormality. Review of the MIP images confirms the above findings. IMPRESSION: 1. No CT evidence for acute pulmonary embolus. 2. Enlargement of the pulmonary outflow tract/main pulmonary arteries suggests pulmonary arterial hypertension. 3. Circumferential wall thickening and septal markings are more prominent in both lungs today, raising the question of interstitial edema. 4. Ascending thoracic aorta measures up to 4.2 cm diameter. Recommend annual imaging followup by CTA or MRA. This recommendation follows 2010 ACCF/AHA/AATS/ACR/ASA/SCA/SCAI/SIR/STS/SVM Guidelines for the Diagnosis and Management of Patients with Thoracic Aortic Disease. Circulation. 2010; 121: Z733-z630. Aortic aneurysm NOS (ICD10-I71.9) 5. Trace left  pleural effusion with dependent atelectasis in the lung bases. 6. Cholelithiasis. 7. Aortic Atherosclerosis (ICD10-I70.0) and Emphysema (ICD10-J43.9). Electronically Signed   By: Camellia Candle M.D.   On: 05/26/2024 10:07   DG Chest Port 1 View Result Date: 05/26/2024 EXAM: 1 VIEW(S) XRAY OF THE CHEST 05/26/2024 07:17:30 AM COMPARISON: Portable chest 05/24/2024 at 10:37 am. CLINICAL HISTORY: SOB. FINDINGS: LUNGS AND PLEURA: Right chest volume loss, chronic .  Coarse interstitial changes again noted in the right lower lung field. Increased patchy opacity in the left base, which could be atelectasis or pneumonia versus aspiration. No overt edema is seen. No pleural effusion. No pneumothorax. HEART AND MEDIASTINUM: Mediastinal shift to the right also chronic . Cardiomegaly with increased central vascular prominence. Stable mediastinum with aortic tortuosity, calcification, and dilatation. BONES AND SOFT TISSUES: No acute osseous abnormality. IMPRESSION: 1. Increased patchy opacity in the left lung base, which may represent atelectasis, pneumonia, or aspiration. 2. Right-sided volume loss with mediastinal shift to the right. 3. Cardiomegaly with increased central vascular prominence, without overt edema. Electronically signed by: Francis Quam MD 05/26/2024 07:30 AM EST RP Workstation: [id number]   Signed: Lonni Africa, DO Internal Medicine Resident, PGY-2 [facility]  9:45 AM, 05/29/2024

## 2024-05-29 NOTE — TOC Transition Note (Signed)
 Transition of Care [facility]) - Discharge Note   Patient Details  Name: [name] MRN: [id number] Date of Birth: 1945-07-02  Transition of [facility]) CM/SW Contact:  Marval Gell, RN Phone Number: 05/29/2024, 10:15 AM   Clinical Narrative:     Per chart review, patient is active w [facility]. Added to HUB and AVS, notified liaison of DC today, family to transport.  No other needs identified   Final next level of care: Home w Home Health Services Barriers to Discharge: Continued Medical Work up   Patient Goals and CMS Choice Patient states their goals for this hospitalization and ongoing recovery are:: return home CMS Medicare.gov Compare Post Acute Care list provided to:: Patient Choice offered to / list presented to : Patient      Discharge Placement                       Discharge Plan and Services Additional resources added to the After Visit Summary for   In-house Referral: NA Discharge Planning Services: CM Consult Post Acute Care Choice: Home Health          DME Arranged: N/A DME Agency: NA       HH Arranged: NA HH Agency: [facility] Date [facility] Agency Contacted: 05/29/24 Time HH Agency Contacted: 1014 Representative spoke with at [facility] Agency: Charleen  Social Drivers of Health (SDOH) Interventions SDOH Screenings   Food Insecurity: No Food Insecurity (05/27/2024)  Housing: Low Risk (05/27/2024)  Transportation Needs: No Transportation Needs (05/27/2024)  Utilities: Not At Risk (05/27/2024)  Alcohol Screen: Low Risk (11/26/2023)  Depression (PHQ2-9): Low Risk (04/26/2024)  Financial Resource Strain: Low Risk (11/26/2023)  Physical Activity: Inactive (11/26/2023)  Social Connections: Moderately Isolated (05/27/2024)  Stress: No Stress Concern Present (11/26/2023)  Tobacco Use: High Risk (05/27/2024)  Health Literacy: Adequate Health Literacy (11/26/2023)     Readmission Risk Interventions    05/28/2024    1:06 PM 05/10/2024    4:29 PM  12/08/2023   11:19 AM  Readmission Risk Prevention Plan  Transportation Screening Complete Complete Complete  PCP or Specialist Appt within 3-5 Days  Complete Complete  HRI or Home Care Consult  Complete Complete  Social Work Consult for Recovery Care Planning/Counseling  Complete Complete  Palliative Care Screening  Not Applicable Not Applicable  Medication Review [employer]) Complete Complete Complete  PCP or Specialist appointment within 3-5 days of discharge Complete    HRI or Home Care Consult Complete    Palliative Care Screening Not Applicable    Skilled Nursing Facility Not Applicable

## 2024-05-29 NOTE — Plan of Care (Signed)
  Problem: Clinical Measurements: Goal: Will remain free from infection Outcome: Progressing Goal: Respiratory complications will improve Outcome: Progressing Goal: Cardiovascular complication will be avoided Outcome: Progressing   Problem: Activity: Goal: Risk for activity intolerance will decrease Outcome: Progressing   Problem: Elimination: Goal: Will not experience complications related to bowel motility Outcome: Progressing Goal: Will not experience complications related to urinary retention Outcome: Progressing   Problem: Safety: Goal: Ability to remain free from injury will improve Outcome: Progressing

## 2024-05-30 ENCOUNTER — Other Ambulatory Visit (HOSPITAL_COMMUNITY): Payer: Self-pay

## 2024-05-31 ENCOUNTER — Encounter (HOSPITAL_COMMUNITY): Payer: Self-pay

## 2024-05-31 ENCOUNTER — Telehealth: Payer: Self-pay

## 2024-05-31 DIAGNOSIS — R0902 Hypoxemia: Principal | ICD-10-CM

## 2024-05-31 LAB — CULTURE, BLOOD (ROUTINE X 2)
Culture: NO GROWTH
Culture: NO GROWTH
Special Requests: ADEQUATE

## 2024-05-31 NOTE — Patient Instructions (Signed)
 Visit Information  Thank you for taking time to visit with me today. Please don't hesitate to contact me if I can be of assistance to you before our next scheduled telephone appointment.  Our next appointment is by telephone on Wednesday 12/24 at 10:00am  Following is a copy of your care plan:   Goals Addressed             This Visit's Progress    VBCI Transitions of Care (TOC) Care Plan       Problems:  Recent Hospitalization for treatment of CHF Hospital or Ed Adm Risk is 99%  Goal:  Over the next 30 days, the patient will not experience hospital readmission  Interventions:   Heart Failure Interventions: Basic overview and discussion of pathophysiology of Heart Failure reviewed Provided education on low sodium diet Assessed need for readable accurate scales in home Provided education about placing scale on hard, flat surface Advised patient to weigh each morning after emptying bladder Discussed importance of daily weight and advised patient to weigh and record daily Reviewed role of diuretics in prevention of fluid overload and management of heart failure; Discussed the importance of keeping all appointments with provider  Take Lasix  40mg  daily  COPD Interventions: Advised patient to track and manage COPD triggers Assessed social determinant of health barriers Discussed the importance of adequate rest and management of fatigue with COPD Provided education about and advised patient to utilize infection prevention strategies to reduce risk of respiratory infection Provided instruction about proper use of medications used for management of COPD including inhalers Use of home oxygen Encourage use of Nicotine  patient and smoking cessation Educated the patient on not using Nicotine  patch if he is smoking Cautioned oxygen and flammatory objects like cigarettes and oxygen Oxygen 3l/min 05/25/24 - The patient went to the ED on 05/24/24 for SOB and COPD exacerbation. He was  started on Furosemide  for 7 days 05/31/24 - The patient states he needs to ask the provider about portable oxygen  Patient Self Care Activities:  Attend all scheduled provider appointments Call pharmacy for medication refills 3-7 days in advance of running out of medications Call provider office for new concerns or questions  Notify RN Care Manager of [facility] call rescheduling needs Participate in Transition of Care Program/Attend [facility] scheduled calls Perform all self care activities independently  Take medications as prescribed    Plan:  Telephone follow up appointment with care management team member scheduled for:  Wednesday December 24th at 10:00am        The patient verbalized understanding of instructions, educational materials, and care plan provided today and agreed to receive a mailed copy of patient instructions, educational materials, and care plan.   The patient has been provided with contact information for the care management team and has been advised to call with any health related questions or concerns.   Please call the care guide team at [phone] if you need to cancel or reschedule your appointment.   Please call the Suicide and Crisis Lifeline: 988 call the [place]  National Suicide Prevention Lifeline: 318-090-6184 or TTY: 531-676-7308 TTY 916-416-4040) to talk to a trained counselor if you are experiencing a Mental Health or Behavioral Health Crisis or need someone to talk to.  Medford Balboa, BSN, RN [place]  VBCI - [employer] [facility] RN Care Manager [phone]

## 2024-05-31 NOTE — Transitions of Care (Post Inpatient/ED Visit) (Signed)
 05/31/2024  Name: [name] MRN: [id number] DOB: [birth year]  Today's TOC FU Call Status: Today's TOC FU Call Status:: Successful TOC FU Call Completed TOC FU Call Complete Date: 05/31/24  Patient's Name and Date of Birth confirmed. Name, DOB  Transition Care Management Follow-up Telephone Call Date of Discharge: 05/29/24 Discharge Facility: [facility]) Type of Discharge: Inpatient Admission Primary Inpatient Discharge Diagnosis:: CHF How have you been since you were released from the hospital?: Better Any questions or concerns?: No  Items Reviewed: Did you receive and understand the discharge instructions provided?: Yes Medications obtained,verified, and reconciled?: Yes (Medications Reviewed) Any new allergies since your discharge?: No Dietary orders reviewed?: Yes Type of Diet Ordered:: Low Sodium Heart Healthy Do you have support at home?: Yes People in Home [RPT]: sibling(s) Name of Support/Comfort Primary Source: Mazie Finder  Medications Reviewed Today: Medications Reviewed Today     Reviewed by Moises Reusing, RN (Case Manager) on 05/31/24 at 1213  Med List Status: <None>   Medication Order Taking? Sig Documenting Provider Last Dose Status Informant  acetaminophen  (TYLENOL ) 500 MG tablet [id number]  Take 500 mg by mouth every 6 (six) hours as needed for moderate pain (pain score 4-6). [provider]  Active Self, Pharmacy Records  albuterol  (VENTOLIN  HFA) 108 (90 Base) MCG/ACT inhaler [id number]  Inhale 1-2 puffs into the lungs every 6 (six) hours as needed for wheezing or shortness of breath. [name], DO  Active Self, Pharmacy Records  amLODipine  (NORVASC ) 10 MG tablet [id number]  Take 1 tablet (10 mg total) by mouth daily. [name], DO  Active Self, Pharmacy Records  aspirin  EC 81 MG tablet [id number]  Take 1 tablet (81 mg total) by mouth daily. Swallow whole. [name] ORN, MD  Active Self, Pharmacy Records  clopidogrel   (PLAVIX ) 75 MG tablet [id number]  Take 1 tablet (75 mg total) by mouth daily. Bethanie Cough, PA-C  Active Self, Pharmacy Records  escitalopram  (LEXAPRO ) 5 MG tablet [id number]  TAKE 1 TABLET (5 MG TOTAL) BY MOUTH DAILY. [name], DO  Active Self, Pharmacy Records  furosemide  (LASIX ) 40 MG tablet [id number]  Take 1 tablet (40 mg total) by mouth daily. Harrie Bruckner, DO  Active   HYDROcodone -acetaminophen  (NORCO) 7.5-325 MG tablet [id number]  Take 1 tablet by mouth 2 (two) times daily as needed for moderate pain (pain score 4-6). Fill 30 days after last fill. [name], DO  Active Self, Pharmacy Records  metoprolol  succinate (TOPROL -XL) 25 MG 24 hr tablet [id number]  Take 1 tablet (25 mg total) by mouth daily. [name], DO  Active Self, Pharmacy Records  mirtazapine  (REMERON ) 7.5 MG tablet [id number]  Take 2 tablets (15 mg total) by mouth at bedtime. [name], DO  Active Self, Pharmacy Records  OXYGEN [id number] Yes Inhale 3 L/min into the lungs continuous. [provider]  Active   Pancrelipase , Lip-Prot-Amyl, (ZENPEP ) 20000-63000 units CPEP [id number]  Take 3 capsules by mouth 3 (three) times daily before meals. [name], DO  Active Self, Pharmacy Records  pantoprazole  (PROTONIX ) 40 MG tablet [id number]  TAKE 1 TABLET EVERY DAY FOR REFLUX [name], DO  Active Self, Pharmacy Records  rosuvastatin  (CRESTOR ) 40 MG tablet [id number]  Take 1 tablet (40 mg total) by mouth daily. Jerrell Cleatus Ned, MD  Active Self, Pharmacy Records  sacubitril -valsartan  (ENTRESTO ) 97-103 MG [id number]  Take 1 tablet by mouth 2 (two) times daily. [name], DO  Active Self, Pharmacy Records  senna (SENOKOT) 8.6 MG TABS tablet [id number]  Take 1 tablet (8.6 mg total) by mouth in the morning and at bedtime. Napoleon Limes, MD  Active Self, Pharmacy Records  Tiotropium Bromide-Olodaterol (STIOLTO RESPIMAT ) 2.5-2.5 MCG/ACT AERS [id number]  Inhale 2 puffs into the lungs  daily. Zaida Zola SAILOR, MD  Active Self, Pharmacy Records            Home Care and Equipment/Supplies: Were Home Health Services Ordered?: Yes Name of Home Health Agency:: [facility] Has Agency set up a time to come to your home?: Yes First Home Health Visit Date: 06/01/24 Any new equipment or medical supplies ordered?: No  Functional Questionnaire: Do you need assistance with bathing/showering or dressing?: No Do you need assistance with meal preparation?: No Do you need assistance with eating?: No Do you have difficulty maintaining continence: No Do you need assistance with getting out of bed/getting out of a chair/moving?: No Do you have difficulty managing or taking your medications?: No  Follow up appointments reviewed: PCP Follow-up appointment confirmed?: Yes Date of PCP follow-up appointment?: 06/08/24 Follow-up Provider: [facility] Follow-up appointment confirmed?: Yes Date of Specialist follow-up appointment?: 06/03/24 Follow-Up Specialty Provider:: Callie [name] Do you need transportation to your follow-up appointment?: No Do you understand care options if your condition(s) worsen?: Yes-patient verbalized understanding  SDOH Interventions Today    Flowsheet Row Most Recent Value  SDOH Interventions   Food Insecurity Interventions Intervention Not Indicated  Housing Interventions Intervention Not Indicated  Transportation Interventions Intervention Not Indicated  Utilities Interventions Intervention Not Indicated    Goals Addressed             This Visit's Progress    VBCI Transitions of Care (TOC) Care Plan       Problems:  Recent Hospitalization for treatment of CHF Hospital or Ed Adm Risk is 99%  Goal:  Over the next 30 days, the patient will not experience hospital readmission  Interventions:   Heart Failure Interventions: Basic overview and discussion of pathophysiology of Heart Failure reviewed Provided education on  low sodium diet Assessed need for readable accurate scales in home Provided education about placing scale on hard, flat surface Advised patient to weigh each morning after emptying bladder Discussed importance of daily weight and advised patient to weigh and record daily Reviewed role of diuretics in prevention of fluid overload and management of heart failure; Discussed the importance of keeping all appointments with provider  Take Lasix  40mg  daily  COPD Interventions: Advised patient to track and manage COPD triggers Assessed social determinant of health barriers Discussed the importance of adequate rest and management of fatigue with COPD Provided education about and advised patient to utilize infection prevention strategies to reduce risk of respiratory infection Provided instruction about proper use of medications used for management of COPD including inhalers Use of home oxygen Encourage use of Nicotine  patient and smoking cessation Educated the patient on not using Nicotine  patch if he is smoking Cautioned oxygen and flammatory objects like cigarettes and oxygen Oxygen 3l/min 05/25/24 - The patient went to the ED on 05/24/24 for SOB and COPD exacerbation. He was started on Furosemide  for 7 days 05/31/24 - The patient states he needs to ask the provider about portable oxygen  Patient Self Care Activities:  Attend all scheduled provider appointments Call pharmacy for medication refills 3-7 days in advance of running out of medications Call provider office for new concerns or questions  Notify RN Care Manager of [facility] call rescheduling needs Participate  in Transition of Care Program/Attend [facility] scheduled calls Perform all self care activities independently  Take medications as prescribed    Plan:  Telephone follow up appointment with care management team member scheduled for:  Wednesday December 24th at 10:00am        Medford Balboa, BSN, RN [place]  VBCI - [facility]  Health RN Care Manager [phone]

## 2024-06-01 ENCOUNTER — Telehealth

## 2024-06-03 ENCOUNTER — Ambulatory Visit: Admitting: Student

## 2024-06-03 ENCOUNTER — Encounter: Payer: Self-pay | Admitting: Student

## 2024-06-03 ENCOUNTER — Telehealth: Payer: Self-pay | Admitting: Internal Medicine

## 2024-06-03 ENCOUNTER — Other Ambulatory Visit: Payer: Self-pay | Admitting: Student in an Organized Health Care Education/Training Program

## 2024-06-03 VITALS — BP 138/82 | HR 61 | Ht 72.0 in | Wt 139.2 lb

## 2024-06-03 DIAGNOSIS — I498 Other specified cardiac arrhythmias: Secondary | ICD-10-CM

## 2024-06-03 DIAGNOSIS — J439 Emphysema, unspecified: Secondary | ICD-10-CM

## 2024-06-03 DIAGNOSIS — I340 Nonrheumatic mitral (valve) insufficiency: Secondary | ICD-10-CM

## 2024-06-03 DIAGNOSIS — I071 Rheumatic tricuspid insufficiency: Secondary | ICD-10-CM

## 2024-06-03 DIAGNOSIS — N1832 Chronic kidney disease, stage 3b: Secondary | ICD-10-CM

## 2024-06-03 DIAGNOSIS — I4729 Other ventricular tachycardia: Secondary | ICD-10-CM | POA: Diagnosis not present

## 2024-06-03 DIAGNOSIS — J9611 Chronic respiratory failure with hypoxia: Secondary | ICD-10-CM

## 2024-06-03 DIAGNOSIS — I2510 Atherosclerotic heart disease of native coronary artery without angina pectoris: Secondary | ICD-10-CM

## 2024-06-03 DIAGNOSIS — E785 Hyperlipidemia, unspecified: Secondary | ICD-10-CM

## 2024-06-03 DIAGNOSIS — I4819 Other persistent atrial fibrillation: Secondary | ICD-10-CM

## 2024-06-03 DIAGNOSIS — I714 Abdominal aortic aneurysm, without rupture, unspecified: Secondary | ICD-10-CM

## 2024-06-03 DIAGNOSIS — I441 Atrioventricular block, second degree: Secondary | ICD-10-CM | POA: Diagnosis not present

## 2024-06-03 DIAGNOSIS — I351 Nonrheumatic aortic (valve) insufficiency: Secondary | ICD-10-CM

## 2024-06-03 DIAGNOSIS — Z87898 Personal history of other specified conditions: Secondary | ICD-10-CM

## 2024-06-03 DIAGNOSIS — I5022 Chronic systolic (congestive) heart failure: Secondary | ICD-10-CM | POA: Diagnosis not present

## 2024-06-03 DIAGNOSIS — I10 Essential (primary) hypertension: Secondary | ICD-10-CM

## 2024-06-03 LAB — BASIC METABOLIC PANEL WITH GFR
BUN/Creatinine Ratio: 14 (ref 10–24)
BUN: 24 mg/dL (ref 8–27)
CO2: 26 mmol/L (ref 20–29)
Calcium: 9 mg/dL (ref 8.6–10.2)
Chloride: 101 mmol/L (ref 96–106)
Creatinine, Ser: 1.66 mg/dL — ABNORMAL HIGH (ref 0.76–1.27)
Glucose: 88 mg/dL (ref 70–99)
Potassium: 4.2 mmol/L (ref 3.5–5.2)
Sodium: 145 mmol/L — ABNORMAL HIGH (ref 134–144)
eGFR: 42 mL/min/1.73 — ABNORMAL LOW (ref >59)

## 2024-06-03 NOTE — Patient Instructions (Addendum)
 Thank you for choosing [facility]!     Medication Instructions:  No medication changes were made during today's visit.   Call your Pulmonary Provider to reschedule your appointment for PFT'S *If you need a refill on your cardiac medications before your next appointment, please call your pharmacy*   Lab Work: BMET If you have labs (blood work) drawn today and your tests are completely normal, you will receive your results only by: MyChart Message (if you have MyChart) OR A paper copy in the mail If you have any lab test that is abnormal or we need to change your treatment, we will call you to review the results.   Testing/Procedures: No procedures were ordered during today's visit.   Your next appointment:   3 month(s)   Provider:   Stanly DELENA Leavens, MD     Follow-Up: At [facility], you and your health needs are our priority.  As part of our continuing mission to provide you with exceptional heart care, we have created designated Provider Care Teams.  These Care Teams include your primary Cardiologist (physician) and Advanced Practice Providers (APPs -  Physician Assistants and Nurse Practitioners) who all work together to provide you with the care you need, when you need it.   We recommend signing up for the patient portal called MyChart.  Sign up information is provided on this After Visit Summary.  MyChart is used to connect with patients for Virtual Visits (Telemedicine).  Patients are able to view lab/test results, encounter notes, upcoming appointments, etc.  Non-urgent messages can be sent to your provider as well.   To learn more about what you can do with MyChart, go to [email or web address].   Heart Failure Education: Weigh yourself EVERY morning after you go to the bathroom but before you eat or drink anything. Write this number down in a weight log/diary. If you gain 3 pounds overnight or 5 pounds in a week, call the office. Take your  medicines as prescribed. If you have concerns about your medications, please call us  before you stop taking them.  Eat low salt foods--Limit salt (sodium) to 2000 mg per day. This will help prevent your body from holding onto fluid. Read food labels as many processed foods have a lot of sodium, especially canned goods and prepackaged meats. If you would like some assistance choosing low sodium foods, we would be happy to set you up with a nutritionist. Limit all fluids for the day to less than 2 liters (64 ounces). Fluid includes all drinks, coffee, juice, ice chips, soup, jello, and all other liquids. Stay as active as you can everyday. Staying active will give you more energy and make your muscles stronger. Start with 5 minutes at a time and work your way up to 30 minutes a day. Break up your activities--do some in the morning and some in the afternoon. Start with 3 days per week and work your way up to 5 days as you can.  If you have chest pain, feel short of breath, dizzy, or lightheaded, STOP. If you don't feel better after a short rest, call 911. If you do feel better, call the office to let us  know you have symptoms with exercise.

## 2024-06-03 NOTE — Telephone Encounter (Signed)
 Hi. Pt needs a 69m fu with Chandrasekhar. Pt preference: early evening (1 PM-3 PM any day) thank you.

## 2024-06-04 ENCOUNTER — Ambulatory Visit: Payer: Self-pay | Admitting: Student

## 2024-06-04 DIAGNOSIS — I2510 Atherosclerotic heart disease of native coronary artery without angina pectoris: Secondary | ICD-10-CM

## 2024-06-07 MED ORDER — SPIRONOLACTONE 25 MG PO TABS: 12.5000 mg | ORAL_TABLET | Freq: Every day | ORAL | 3 refills | Status: AC

## 2024-06-07 NOTE — Progress Notes (Signed)
 Spoke with patient regarding lab results. He understands that he is to take spironolactone  12.5mg , (breaking pill in half). He also knows to return for lab draw.

## 2024-06-07 NOTE — Addendum Note (Signed)
 Addended by: Yosselin Zoeller L on: 06/07/2024 12:24 PM   Modules accepted: Orders

## 2024-06-08 ENCOUNTER — Ambulatory Visit: Payer: Self-pay

## 2024-06-08 ENCOUNTER — Other Ambulatory Visit: Payer: Self-pay

## 2024-06-08 VITALS — BP 124/75 | HR 89 | Temp 98.0°F | Ht 72.0 in | Wt 134.2 lb

## 2024-06-08 DIAGNOSIS — I509 Heart failure, unspecified: Secondary | ICD-10-CM

## 2024-06-08 DIAGNOSIS — R319 Hematuria, unspecified: Secondary | ICD-10-CM

## 2024-06-08 NOTE — Patient Instructions (Addendum)
 Thank you, [name] for allowing us  to provide your care today. Today we discussed the following:  - Please HOLD taking your aspirin  and Plavix  given the blood in your urine currently. We want you to follow up ASAP with Urology Dr. Gaston or his associates regarding this. Please call [phone] [facility], please let them know you have been experiencing 1 week of frank blood in your urine and foley bag and that your PCP is requesting you be seen by them ASAP   - We will need to see you back in the next 2-3 weeks in the Internal Medicine clinic to discuss appropriateness of resuming your Plavix  and Aspirin     I have ordered the following labs for you:  Lab Orders         Comprehensive metabolic panel with GFR         Urinalysis, Complete (81001)         CBC with Diff       Referrals ordered today:   Referral Orders         AMB REFERRAL FOR DME       I have ordered the following medication/changed the following medications:   DO NOT TAKE YOUR ASPIRIN  OR PLAVIX  UNTIL YOU RETURN TO SEE US  IN THE INTERNAL MEDICINE CLINIC OR IF UROLOGY CLEARS YOU TO RESUME IT SOONER     Follow up: please make sure you go see [facility] ASAP regarding frank blood in catheter bag; we will want to see you back in the next 2 weeks in the Internal Medicine clinic    Should you have any questions or concerns please call the Internal Medicine Clinic at [phone].     [name] Miyamoto, MD [facility]

## 2024-06-08 NOTE — Progress Notes (Addendum)
 "  Patient name: [name] Date of birth: Feb 19, 1946 Date of visit: 06/12/2024  Type of visit: Established Patient Office Visit   Subjective   Chief concern:  Chief Complaint  Patient presents with   Transitions Of Care    HFU  / needing oxygen smaller tank-follow up CHF    [name] is a 78 y.o. male with a history of HTN, ASCVD, AAA, CHF, GERD, emphysema, prostate adenocarcinoma, gout, who presents to [facility] clinic for hospital follow up. Telephone follow up was done on 12/13 and 05/31/2024.  He was hospitalized from 12/10 - 05/29/2024 for acute HFpEF. He was treated with IV lasix  for 3 days and 7L of fluid was removed. His discharge weight is 61.6 kg, or 135lbs. He discharge lasix  regimen is 40mg  daily. He was stable for much of his hospitalization on 3L [place], which is his pre-hospital baseline.   Today patient reports that he has been taking Lasix  40 mg daily as instructed since discharge from the hospital.  He utilizes home oxygen 3L [place], and is requesting a portable oxygen container request to be sent to [facility].   During review of systems, patient did report roughly 1 week of pink-tinged urine which she states is now blood red.  He does have a chronic urinary catheter for which he has seen [facility] Dr. Glendia MacDiarmid and Dr. Carolee for for adenocarcinoma of his prostate, though I do not have access to these records.  He reports that his catheter is changed out by home nurse aide monthly, though it was last changed prior to discharge from his hospitalization recently.  He denies any burning or pain of his urethra or penis, and is otherwise not experiencing any symptoms locally or systemically.  He denies any fever or chills recently.  No black or bloody bowel movements recently.  ROS: Denies headaches, dizziness, fever, chills, runny nose, sore throat, vision changes, hearing changes, chest pain, shortness of breath, difficulty breathing, nausea, vomiting, abdominal  pain. Denies increased urinary frequency, pain with urination, constipation or diarrhea. No recent falls. Positive for blood in catheter bag.   Patient Active Problem List   Diagnosis Date Noted   Hypoxia 05/31/2024   Pressure injury of skin 05/27/2024   Acute exacerbation of CHF (congestive heart failure) (HCC) 05/26/2024   Atelectasis, right 05/14/2024   Postobstructive pneumonia 05/12/2024   COPD exacerbation (HCC) 05/12/2024   Acute hypoxemic respiratory failure (HCC) 05/07/2024   Acute on chronic hypoxic respiratory failure (HCC) 03/26/2024   Suprapubic catheter (HCC) 12/22/2023   Thrombocytopenia 12/22/2023   Chronic congestive heart failure (HCC) 06/02/2023   Acute pulmonary edema (HCC) 06/01/2023   S/P AAA repair 08/16/2022   Emphysema lung (HCC) 03/05/2022   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   Anemia    Atrial fibrillation (HCC) 03/05/2021   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   Mild protein-calorie malnutrition 11/07/2020   Acute kidney injury superimposed on CKD 11/06/2020   Urinary retention 04/02/2018   Mild alcohol use disorder 12/09/2016   Premature atrial contractions 07/31/2015   Chronic use of opiate for therapeutic purpose 05/01/2015   Hematuria 03/02/2015   Atherosclerosis of aorta (HCC) 01/13/2015   Prediabetes 12/30/2014   AAA (abdominal aortic aneurysm)    Healthcare maintenance 06/30/2012   ASCVD (arteriosclerotic cardiovascular disease) 03/17/2012   Erectile dysfunction 10/12/2010   Arthropathy of pelvic region and thigh 05/02/2008   CKD (chronic kidney disease) stage 2, GFR 60-89 ml/min  08/07/2007   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   Gout 04/18/2006   Tobacco use disorder 04/18/2006   Essential hypertension 04/18/2006   GERD 04/18/2006     Past Surgical History:  Procedure Laterality Date   ABDOMINAL AORTIC ENDOVASCULAR FENESTRATED STENT GRAFT N/A 08/16/2022   Procedure: ABDOMINAL  AORTIC ENDOVASCULAR FENESTRATED STENT GRAFT;  Surgeon: [name] ORN, MD;  Location: [facility] OR;  Service: Vascular;  Laterality: N/A;   ABDOMINAL AORTIC ENDOVASCULAR FENESTRATED STENT GRAFT N/A 12/02/2023   Procedure: ABDOMINAL AORTIC ENDOVASCULAR FENESTRATED STENT GRAFT WITH ULTRASOUND GUIDED ACCESS;  Surgeon: [name] ORN, MD;  Location: [facility] INVASIVE CV LAB;  Service: Cardiovascular;  Laterality: N/A;   BIOPSY  11/12/2020   Procedure: BIOPSY;  Surgeon: Legrand Victory LITTIE DOUGLAS, MD;  Location: [facility] ENDOSCOPY;  Service: Gastroenterology;;   BIOPSY  07/25/2021   Procedure: BIOPSY;  Surgeon: Stacia Glendia BRAVO, MD;  Location: [facility] ENDOSCOPY;  Service: Gastroenterology;;   BIOPSY  12/24/2021   Procedure: BIOPSY;  Surgeon: Wilhelmenia Aloha Raddle., MD;  Location: [facility] ENDOSCOPY;  Service: Gastroenterology;;   BRONCHIAL BIOPSY  07/27/2021   Procedure: BRONCHIAL BIOPSIES;  Surgeon: Shelah Lamar RAMAN, MD;  Location[facility] ENDOSCOPY;  Service: Cardiopulmonary;;   BRONCHIAL BRUSHINGS  07/27/2021   Procedure: BRONCHIAL BRUSHINGS;  Surgeon: Shelah Lamar RAMAN, MD;  Location: [facility] ENDOSCOPY;  Service: Cardiopulmonary;;   cataract surgery Bilateral 2018   removal of cataracts   COLONOSCOPY     hx polyps - [place] 02/2012   COLONOSCOPY WITH PROPOFOL  N/A 11/12/2020   Procedure: COLONOSCOPY WITH PROPOFOL ;  Surgeon: Legrand Victory LITTIE DOUGLAS, MD;  Location: [facility] ENDOSCOPY;  Service: Gastroenterology;  Laterality: N/A;   CORONARY ANGIOPLASTY WITH STENT PLACEMENT     ENDOVASCULAR STENT INSERTION Bilateral 08/16/2022   Procedure: ENDOVASCULAR STENT GRAFT INSERTION OF BILATERAL RENAL ARTERIES;  Surgeon: [name] ORN, MD;  Location: [facility] OR;  Service: Vascular;  Laterality: Bilateral;   ESOPHAGOGASTRODUODENOSCOPY (EGD) WITH PROPOFOL  N/A 11/12/2020   Procedure: ESOPHAGOGASTRODUODENOSCOPY (EGD) WITH PROPOFOL ;  Surgeon: Legrand Victory LITTIE DOUGLAS, MD;  Location: [facility] ENDOSCOPY;  Service: Gastroenterology;  Laterality: N/A;   ESOPHAGOGASTRODUODENOSCOPY (EGD) WITH  PROPOFOL  N/A 03/06/2021   Procedure: ESOPHAGOGASTRODUODENOSCOPY (EGD) WITH PROPOFOL ;  Surgeon: Leigh Elspeth SQUIBB, MD;  Location: [facility] ENDOSCOPY;  Service: Gastroenterology;  Laterality: N/A;   ESOPHAGOGASTRODUODENOSCOPY (EGD) WITH PROPOFOL  N/A 07/25/2021   Procedure: ESOPHAGOGASTRODUODENOSCOPY (EGD) WITH PROPOFOL ;  Surgeon: Stacia Glendia BRAVO, MD;  Location: [facility] ENDOSCOPY;  Service: Gastroenterology;  Laterality: N/A;   ESOPHAGOGASTRODUODENOSCOPY (EGD) WITH PROPOFOL  N/A 12/24/2021   Procedure: ESOPHAGOGASTRODUODENOSCOPY (EGD) WITH PROPOFOL ;  Surgeon: Wilhelmenia Aloha Raddle., MD;  Location: [facility] ENDOSCOPY;  Service: Gastroenterology;  Laterality: N/A;   flexible cystourethroscopy  05/24/06   HEMOSTASIS CLIP PLACEMENT  03/06/2021   Procedure: HEMOSTASIS CLIP PLACEMENT;  Surgeon: Leigh Elspeth SQUIBB, MD;  Location: [facility] ENDOSCOPY;  Service: Gastroenterology;;   HEMOSTASIS CLIP PLACEMENT  12/24/2021   Procedure: HEMOSTASIS CLIP PLACEMENT;  Surgeon: Wilhelmenia Aloha Raddle., MD;  Location: [facility] ENDOSCOPY;  Service: Gastroenterology;;   HOT HEMOSTASIS N/A 03/06/2021   Procedure: HOT HEMOSTASIS (ARGON PLASMA COAGULATION/BICAP);  Surgeon: Leigh Elspeth SQUIBB, MD;  Location: [facility] ENDOSCOPY;  Service: Gastroenterology;  Laterality: N/A;   INGUINAL HERNIA REPAIR  2005   INSERTION OF ILIAC STENT Bilateral 08/16/2022   Procedure: INSERTION OF ILIAC STENT RIGHT EXTERNAL ARTERY, LEFT COMMON ARTERY, AND RIGHT COMMON ARTERY;  Surgeon: [name] ORN, MD;  Location: [facility] OR;  Service: Vascular;  Laterality: Bilateral;   IR CATHETER TUBE CHANGE  07/08/2022   LUNG LOBECTOMY  Right 11/07   RUL   MULTIPLE TOOTH EXTRACTIONS     POLYPECTOMY  11/12/2020   Procedure: POLYPECTOMY;  Surgeon: Legrand Victory LITTIE DOUGLAS, MD;  Location: [facility] ENDOSCOPY;  Service: Gastroenterology;;   POLYPECTOMY  12/24/2021   Procedure: POLYPECTOMY;  Surgeon: Wilhelmenia Aloha Raddle., MD;  Location: [facility] ENDOSCOPY;  Service: Gastroenterology;;   SUBMUCOSAL LIFTING  INJECTION  12/24/2021   Procedure: SUBMUCOSAL LIFTING INJECTION;  Surgeon: Wilhelmenia Aloha Raddle., MD;  Location: [facility] ENDOSCOPY;  Service: Gastroenterology;;   TRANSURETHRAL RESECTION OF PROSTATE N/A 05/02/2021   Procedure: TRANSURETHRAL RESECTION OF THE PROSTATE (TURP);  Surgeon: Carolee Sherwood JONETTA DOUGLAS, MD;  Location: [facility] ORS;  Service: Urology;  Laterality: N/A;   ULTRASOUND GUIDANCE FOR VASCULAR ACCESS Bilateral 08/16/2022   Procedure: ULTRASOUND GUIDANCE FOR VASCULAR ACCESS;  Surgeon: [name] ORN, MD;  Location: [facility] OR;  Service: Vascular;  Laterality: Bilateral;   UPPER ESOPHAGEAL ENDOSCOPIC ULTRASOUND (EUS) N/A 12/24/2021   Procedure: UPPER ESOPHAGEAL ENDOSCOPIC ULTRASOUND (EUS);  Surgeon: Wilhelmenia Aloha Raddle., MD;  Location: [facility]OSCOPY;  Service: Gastroenterology;  Laterality: N/A;   UPPER GASTROINTESTINAL ENDOSCOPY     VIDEO BRONCHOSCOPY  07/27/2021   Procedure: VIDEO BRONCHOSCOPY WITHOUT FLUORO;  Surgeon: Shelah Lamar RAMAN, MD;  Location: [facility] ENDOSCOPY;  Service: Cardiopulmonary;;   VIDEO BRONCHOSCOPY Right 05/14/2024   Procedure: VIDEO BRONCHOSCOPY WITHOUT FLUORO;  Surgeon: Jude Harden GAILS, MD;  Location: [facility] ENDOSCOPY;  Service: Cardiopulmonary;  Laterality: Right;     Current Outpatient Medications  Medication Instructions   acetaminophen  (TYLENOL ) 500 mg, Every 6 hours PRN   albuterol  (VENTOLIN  HFA) 108 (90 Base) MCG/ACT inhaler 1-2 puffs, Inhalation, Every 6 hours PRN   amLODipine  (NORVASC ) 10 mg, Oral, Daily   aspirin  EC 81 mg, Oral, Daily, Swallow whole.   clopidogrel  (PLAVIX ) 75 mg, Oral, Daily   escitalopram  (LEXAPRO ) 5 mg, Oral, Daily   furosemide  (LASIX ) 40 mg, Oral, Daily   HYDROcodone -acetaminophen  (NORCO) 7.5-325 MG tablet 1 tablet, Oral, 2 times daily PRN, Fill 30 days after last fill.   metoprolol  succinate (TOPROL -XL) 25 mg, Oral, Daily   mirtazapine  (REMERON ) 15 mg, Oral, Daily at bedtime   OXYGEN 3 L/min, Continuous   Pancrelipase , Lip-Prot-Amyl, (ZENPEP )  20000-63000 units CPEP 3 capsules, Oral, 3 times daily before meals   pantoprazole  (PROTONIX ) 40 MG tablet TAKE 1 TABLET EVERY DAY FOR REFLUX   rosuvastatin  (CRESTOR ) 40 mg, Oral, Daily   sacubitril -valsartan  (ENTRESTO ) 97-103 MG 1 tablet, Oral, 2 times daily   senna (SENOKOT) 8.6 mg, Oral, 2 times daily   spironolactone  (ALDACTONE ) 12.5 mg, Oral, Daily   Tiotropium Bromide-Olodaterol (STIOLTO RESPIMAT ) 2.5-2.5 MCG/ACT AERS 2 puffs, Inhalation, Daily    Social History[1]    Objective  Today's Vitals   06/08/24 1335 06/08/24 1342  BP: (!) 152/75 124/75  Pulse: (!) 101 89  Temp: 98 F (36.7 C)   TempSrc: Oral   SpO2: 90%   Weight: 134 lb 3.2 oz (60.9 kg)   Height: 6' (1.829 m)   PainSc: 0-No pain   Body mass index is 18.2 kg/m.   Physical Exam:   Constitutional: well-appearing male sitting in exam chair, in no acute distress. Ambulates with use of cane assistance device HEENT: normocephalic atraumatic, mucous membranes moist Eyes: conjunctiva non-erythematous Cardiovascular: regular rate and rhythm, bilateral radial pulses 2+, bilateral dorsal pedal pulses 2+, brisk capillary refill bilateral feet and hands  Pulmonary/Chest: normal work of breathing on room air, lungs clear to auscultation bilaterally Abdominal: soft, non-tender, non-distended, bloody urine in foley bag  MSK: normal bulk and tone. Neurological: alert & oriented x 3 Skin: warm and dry Psych: mood calm, behavior normal, thought content normal, judgement normal      The ASCVD Risk score (Arnett DK, et al., 2019) failed to calculate for the following reasons:   Risk score cannot be calculated because patient has a medical history suggesting prior/existing ASCVD   * - Cholesterol units were assumed      Assessment & Plan  Problem List Items Addressed This Visit     Hematuria   Patient presents to clinic today for hospital follow-up regarding exacerbation of congestive heart failure.  When conducting  review of systems, he does report roughly 1 week of pink-tinged urine which he said is now blood red.  On physical exam Foley bag is full of bloody urine.  He denies any fever or chills, and has not had any pain or burning locally around the site.  He reports that he feels in his normal state of health.  He follows with [facility] Dr. Glendia MacDiarmid and Dr. Carolee for for adenocarcinoma of his prostate, though I do not have access to these records.  He reports that his catheter is changed out by home nurse aide monthly, though it was last changed prior to discharge from his hospitalization recently.  Although he is feeling well, I am concerned with the amount of bloody urine present.  Today in the office spent > 1 hour trying to get in touch with [facility] to set up an urgent appointment for the patient, though I was unsuccessful.  Discussed with patient that I am recommending that he calls them or goes over their office to schedule an ASAP appointment and to let them know about his Foley.  Discussed with him that if he is unable to get a sooner appointment or reach them, then I would recommend going to the emergency department for evaluation.  He does take Plavix  75 mg daily and aspirin  81 mg daily which I have advised him to hold both of these medications in the setting of his hematuria.  We will obtain CBC today to ensure that his anemia is stable and has not worsened.  He voiced understanding of the plan, and we will plan to see him back for close follow-up in about 2 weeks to discuss possibly resuming Plavix  and aspirin  at that time.  I discussed with patient that if he is able to follow-up with urology and they think that he can resume Plavix  and aspirin  prior to seeing us  in the [facility], then he should do so but otherwise he will wait until he sees us . - HOLD Plavix  75 mg daily iso hematuria - HOLD Aspirin  81 mg daily iso hematuria  - Patient to call [facility] for ASAP appointment, and if  unable to see them quickly, then have discussed that I recommend patient present to the emergency department so that can be evaluated - f/u CBC  - f/u in 2 weeks to discuss resuming Plavix  and Aspirin    ADDENDUM 12/24:  [facility] was able to get in touch with the patient and offered him an ASAP appointment on 06/09/2024, though patient declined to this appointment stating that he had too much to do, and he will be seeing them on 06/14/2024.      Relevant Orders   Urinalysis, Complete (81001) (Completed)   CBC with Diff (Completed)   Chronic congestive heart failure (HCC)   Patient was recently hospitalized for acute exacerbation of  congestive heart failure.  He was treated with IV Lasix  for 3 days and 7 L of fluid was removed.  His discharge weight was 135 lbs and he was discharged with a Lasix  regimen of 40 mg daily, which he reports that he has been adherent to.  Today he reports his breathing is at baseline on 3L [place].  He is requesting a portable oxygen prescription to be sent to [facility].  During his hospitalization he had some mild transaminitis, so today we will confirm that this is improving or resolved. - Continue Lasix  40 mg daily - f/u CMP - Rx for Portable O2 to [facility]       Acute exacerbation of CHF (congestive heart failure) (HCC) - Primary   Relevant Orders   Comprehensive metabolic panel with GFR (Completed)   AMB REFERRAL FOR DME    Return in about 2 weeks (around 06/22/2024) for f/u hematuria, discuss Plavix  & ASA .  Patient discussed with Dr. Trudy.  [name], MD [facility]  PGY-1 06/12/2024, 1:13 PM      [1]  Social History Tobacco Use   Smoking status: Some Days    Current packs/day: 0.75    Average packs/day: 0.8 packs/day for 55.0 years (41.3 ttl pk-yrs)    Types: Cigarettes   Smokeless tobacco: Never   Tobacco comments:    Smokes 2 -3 cigarettes per day sometimes.  Vaping Use   Vaping status: Never Used  Substance Use  Topics   Alcohol use: Not Currently   Drug use: No   "

## 2024-06-09 ENCOUNTER — Telehealth: Payer: Self-pay

## 2024-06-09 ENCOUNTER — Ambulatory Visit: Payer: Self-pay

## 2024-06-09 ENCOUNTER — Telehealth: Payer: Self-pay | Admitting: *Deleted

## 2024-06-09 LAB — URINALYSIS, COMPLETE
Glucose, UA: NEGATIVE
Ketones, UA: NEGATIVE
Nitrite, UA: NEGATIVE
Specific Gravity, UA: 1.011 (ref 1.005–1.030)
Urobilinogen, Ur: 0.2 mg/dL (ref 0.2–1.0)
pH, UA: 7.5 (ref 5.0–7.5)

## 2024-06-09 LAB — COMPREHENSIVE METABOLIC PANEL WITH GFR
ALT: 45 IU/L — ABNORMAL HIGH (ref 0–44)
AST: 38 IU/L (ref 0–40)
Albumin: 3.8 g/dL (ref 3.8–4.8)
Alkaline Phosphatase: 70 IU/L (ref 47–123)
BUN/Creatinine Ratio: 14 (ref 10–24)
BUN: 26 mg/dL (ref 8–27)
Bilirubin Total: 0.4 mg/dL (ref 0.0–1.2)
CO2: 27 mmol/L (ref 20–29)
Calcium: 8.5 mg/dL — ABNORMAL LOW (ref 8.6–10.2)
Chloride: 102 mmol/L (ref 96–106)
Creatinine, Ser: 1.84 mg/dL — ABNORMAL HIGH (ref 0.76–1.27)
Globulin, Total: 2.3 g/dL (ref 1.5–4.5)
Glucose: 84 mg/dL (ref 70–99)
Potassium: 3.6 mmol/L (ref 3.5–5.2)
Sodium: 143 mmol/L (ref 134–144)
Total Protein: 6.1 g/dL (ref 6.0–8.5)
eGFR: 37 mL/min/1.73 — ABNORMAL LOW

## 2024-06-09 LAB — CBC WITH DIFFERENTIAL/PLATELET
Basophils Absolute: 0 x10E3/uL (ref 0.0–0.2)
Basos: 1 %
EOS (ABSOLUTE): 0.1 x10E3/uL (ref 0.0–0.4)
Eos: 2 %
Hematocrit: 25.2 % — ABNORMAL LOW (ref 37.5–51.0)
Hemoglobin: 7.8 g/dL — ABNORMAL LOW (ref 13.0–17.7)
Immature Grans (Abs): 0 x10E3/uL (ref 0.0–0.1)
Immature Granulocytes: 0 %
Lymphocytes Absolute: 1.1 x10E3/uL (ref 0.7–3.1)
Lymphs: 25 %
MCH: 31.6 pg (ref 26.6–33.0)
MCHC: 31 g/dL — ABNORMAL LOW (ref 31.5–35.7)
MCV: 102 fL — ABNORMAL HIGH (ref 79–97)
Monocytes Absolute: 0.3 x10E3/uL (ref 0.1–0.9)
Monocytes: 8 %
Neutrophils Absolute: 2.8 x10E3/uL (ref 1.4–7.0)
Neutrophils: 64 %
Platelets: 104 x10E3/uL — ABNORMAL LOW (ref 150–450)
RBC: 2.47 x10E6/uL — CL (ref 4.14–5.80)
RDW: 14.2 % (ref 11.6–15.4)
WBC: 4.3 x10E3/uL (ref 3.4–10.8)

## 2024-06-09 LAB — MICROSCOPIC EXAMINATION
Casts: NONE SEEN /LPF
RBC, Urine: >30 /HPF — AB (ref 0–2)

## 2024-06-09 NOTE — Telephone Encounter (Signed)
 Sueanne - [facility]  Stated that she was able to get in touch with patient but he refused appointment for today 06/09/24 due to being busy. Patient is scheduled for Monday 06/14/24.

## 2024-06-09 NOTE — Telephone Encounter (Signed)
 Copied from CRM #[id number]. Topic: Appointments - Appointment Scheduling >> Jun 09, 2024  8:33 AM Diannia DEL wrote: Mattie Triage Nurse is calling from [facility] for provider [name] to let her know that she was unable to get in touch with the patient. Could you assist and give her a call? Callback [phone] option 6 go directly to triage.

## 2024-06-09 NOTE — Telephone Encounter (Signed)
 I called pt to let him know [facility] stated they was unable to get in touch with him and when they call back, be sure to answer the phone - stated he will. I called [facility] triage nurse; on hold x 10 mins - had to leave a message that I had spoken to the pt and made him aware someone from their office will be calling him.

## 2024-06-12 NOTE — Assessment & Plan Note (Addendum)
 Patient presents to clinic today for hospital follow-up regarding exacerbation of congestive heart failure.  When conducting review of systems, he does report roughly 1 week of pink-tinged urine which he said is now blood red.  On physical exam Foley bag is full of bloody urine.  He denies any fever or chills, and has not had any pain or burning locally around the site.  He reports that he feels in his normal state of health.  He follows with [facility] Dr. Glendia MacDiarmid and Dr. Carolee for for adenocarcinoma of his prostate, though I do not have access to these records.  He reports that his catheter is changed out by home nurse aide monthly, though it was last changed prior to discharge from his hospitalization recently.  Although he is feeling well, I am concerned with the amount of bloody urine present.  Today in the office spent > 1 hour trying to get in touch with [facility] to set up an urgent appointment for the patient, though I was unsuccessful.  Discussed with patient that I am recommending that he calls them or goes over their office to schedule an ASAP appointment and to let them know about his Foley.  Discussed with him that if he is unable to get a sooner appointment or reach them, then I would recommend going to the emergency department for evaluation.  He does take Plavix  75 mg daily and aspirin  81 mg daily which I have advised him to hold both of these medications in the setting of his hematuria.  We will obtain CBC today to ensure that his anemia is stable and has not worsened.  He voiced understanding of the plan, and we will plan to see him back for close follow-up in about 2 weeks to discuss possibly resuming Plavix  and aspirin  at that time.  I discussed with patient that if he is able to follow-up with urology and they think that he can resume Plavix  and aspirin  prior to seeing us  in the [facility], then he should do so but otherwise he will wait until he sees us . - HOLD Plavix  75 mg  daily iso hematuria - HOLD Aspirin  81 mg daily iso hematuria  - Patient to call [facility] for ASAP appointment, and if unable to see them quickly, then have discussed that I recommend patient present to the emergency department so that can be evaluated - f/u CBC  - f/u in 2 weeks to discuss resuming Plavix  and Aspirin    ADDENDUM 12/24:  [facility] was able to get in touch with the patient and offered him an ASAP appointment on 06/09/2024, though patient declined to this appointment stating that he had too much to do, and he will be seeing them on 06/14/2024.

## 2024-06-12 NOTE — Assessment & Plan Note (Signed)
 Patient was recently hospitalized for acute exacerbation of congestive heart failure.  He was treated with IV Lasix  for 3 days and 7 L of fluid was removed.  His discharge weight was 135 lbs and he was discharged with a Lasix  regimen of 40 mg daily, which he reports that he has been adherent to.  Today he reports his breathing is at baseline on 3L [place].  He is requesting a portable oxygen prescription to be sent to [facility].  During his hospitalization he had some mild transaminitis, so today we will confirm that this is improving or resolved. - Continue Lasix  40 mg daily - f/u CMP - Rx for Portable O2 to [facility]

## 2024-06-15 LAB — FUNGUS CULTURE WITH STAIN: Fungal Source: 188243

## 2024-06-15 LAB — BASIC METABOLIC PANEL WITH GFR
BUN/Creatinine Ratio: 13 (ref 10–24)
BUN: 25 mg/dL (ref 8–27)
CO2: 24 mmol/L (ref 20–29)
Calcium: 9.1 mg/dL (ref 8.6–10.2)
Chloride: 102 mmol/L (ref 96–106)
Creatinine, Ser: 1.9 mg/dL — ABNORMAL HIGH (ref 0.76–1.27)
Glucose: 82 mg/dL (ref 70–99)
Potassium: 3.5 mmol/L (ref 3.5–5.2)
Sodium: 144 mmol/L (ref 134–144)
eGFR: 36 mL/min/1.73 — ABNORMAL LOW

## 2024-06-15 LAB — FUNGAL ORGANISM REFLEX

## 2024-06-15 LAB — FUNGUS CULTURE RESULT

## 2024-06-21 NOTE — Progress Notes (Signed)
 Spoke with patient regarding lab results. Requested a letter be sent to him as reminder. Mailing letter today.

## 2024-06-21 NOTE — Progress Notes (Signed)
 Internal Medicine Clinic Attending  Case discussed with the resident at the time of the visit.  We reviewed the residents history and exam and pertinent patient test results.  I agree with the assessment, diagnosis, and plan of care documented in the residents note. I also spent considerable time on phone trying to establish urgent f/u at [facility]; phone went to triage line which wasn't answered (staff busy caring for patients). Agreed with plan for him to go himself to [facility], and if can't be seen there, to go to the [facility] ED which is next door.

## 2024-06-22 ENCOUNTER — Ambulatory Visit: Payer: Self-pay | Admitting: Student

## 2024-06-22 VITALS — BP 123/65 | HR 71 | Temp 98.8°F | Ht 72.0 in | Wt 136.6 lb

## 2024-06-22 DIAGNOSIS — I509 Heart failure, unspecified: Secondary | ICD-10-CM

## 2024-06-22 DIAGNOSIS — J449 Chronic obstructive pulmonary disease, unspecified: Secondary | ICD-10-CM

## 2024-06-22 DIAGNOSIS — I110 Hypertensive heart disease with heart failure: Secondary | ICD-10-CM | POA: Diagnosis not present

## 2024-06-22 DIAGNOSIS — R319 Hematuria, unspecified: Secondary | ICD-10-CM | POA: Diagnosis not present

## 2024-06-22 DIAGNOSIS — J9611 Chronic respiratory failure with hypoxia: Secondary | ICD-10-CM | POA: Diagnosis not present

## 2024-06-22 NOTE — Patient Instructions (Addendum)
 Thank you, Mr.Govani L Scheffel, for allowing us  to provide your care today. Today we discussed . . .  > Blood in Urine       - Resume taking aspirin  81 mg daily. Watch for any blood in your urine bag. If there is no blood in your urine after 1 week, resume the plavix  75 mg daily. If you notice any blood in your urine, stop both aspirin  and plavix  and call our office. Please call the urology office and schedule a visit with them.  [facility] [address] [place] [place],  [place]  [address] Main: [phone] > COPD       - We are working on your portable oxygen machine and someone will call you about it soon. Please make a follow up appointment with your lung doctor too.  [facility] [address] [place],  [place]  [address] Main: [phone]   Follow up: 1 month    Remember:  Should you have any questions or concerns please call the internal medicine clinic at [phone].     Fairy Pool, DO [facility]

## 2024-06-22 NOTE — Progress Notes (Signed)
 "  CC: Routine Follow Up for hematuria after last office visit 06/08/2024  HPI:  [name] is a 79 y.o. male with pertinent PMH of hypertension, atrial fibrillation not on anticoagulation, CHF, COPD/emphysema, urinary retention with indwelling Foley catheter, history of prostate cancer, and PAD who presents as above. Please see assessment and plan below for further details.  Medications: Current Outpatient Medications  Medication Instructions   acetaminophen  (TYLENOL ) 500 mg, Every 6 hours PRN   albuterol  (VENTOLIN  HFA) 108 (90 Base) MCG/ACT inhaler 1-2 puffs, Inhalation, Every 6 hours PRN   amLODipine  (NORVASC ) 10 mg, Oral, Daily   aspirin  EC 81 mg, Oral, Daily, Swallow whole.   clopidogrel  (PLAVIX ) 75 mg, Oral, Daily   diphenhydrAMINE  (BENADRYL ) 50 MG capsule Take one capsule 1 hour prior to scan.   escitalopram  (LEXAPRO ) 5 mg, Oral, Daily   furosemide  (LASIX ) 40 mg, Oral, Daily   HYDROcodone -acetaminophen  (NORCO) 7.5-325 MG tablet 1 tablet, Oral, 2 times daily PRN, Fill 30 days after last fill.   metoprolol  succinate (TOPROL -XL) 25 mg, Oral, Daily   mirtazapine  (REMERON ) 15 mg, Oral, Daily at bedtime   OXYGEN 3 L/min, Continuous   Pancrelipase , Lip-Prot-Amyl, (ZENPEP ) 20000-63000 units CPEP 3 capsules, Oral, 3 times daily before meals   pantoprazole  (PROTONIX ) 40 MG tablet TAKE 1 TABLET EVERY DAY FOR REFLUX   predniSONE  (DELTASONE ) 50 MG tablet Take one tablet 13 hours, 7 hours, and 1 hour prior to scan.   rosuvastatin  (CRESTOR ) 40 mg, Oral, Daily   sacubitril -valsartan  (ENTRESTO ) 97-103 MG 1 tablet, Oral, 2 times daily   senna (SENOKOT) 8.6 mg, Oral, 2 times daily   spironolactone  (ALDACTONE ) 12.5 mg, Oral, Daily   Tiotropium Bromide-Olodaterol (STIOLTO RESPIMAT ) 2.5-2.5 MCG/ACT AERS 2 puffs, Inhalation, Daily     Review of Systems:   Pertinent items noted in HPI and/or A&P.  Physical Exam:  Vitals:   06/22/24 1414  BP: 123/65  Pulse: 71  Temp: 98.8 F (37.1 C)   TempSrc: Oral  Weight: 136 lb 9.6 oz (62 kg)  Height: 6' (1.829 m)    Constitutional: Chronically ill-appearing elderly male. In no acute distress. HEENT: Normocephalic, atraumatic, Sclera non-icteric, PERRL, EOM intact Cardio:Regular rate and rhythm. 2+ bilateral radial pulses. Pulm: Coarse breath sounds throughout lung fields. Normal work of breathing on room air. FDX:Wzhjupcz for extremity edema.  Foley catheter reservoir with clear urine in the bag. Skin:Warm and dry. Neuro:Alert and oriented x3. No focal deficit noted. Psych:Pleasant mood and affect.   Assessment & Plan:   Assessment & Plan Hematuria, unspecified type Returns today after being seen on 06/08/2024 for persistent hematuria.  He has an indwelling Foley catheter that is changed monthly.  At our prior visit his dual antiplatelet therapy was stopped and he was strongly advised to follow-up with his urologist.  Apparently he had  and urgent visit scheduled on 06/05/2024 but could not go to this and was rescheduled to 06/14/2024.  We discussed this and it does not appear that he went to that visit, although we cannot see the urology office records.  Since stopping his dual antiplatelet therapy his hematuria has resolved and there is clear urine in his bag today.  CBC drawn at his last visit showed stable hemoglobin at 7.8 and platelets improved to 104 from 56 a few weeks prior.  Discussed the importance of following up with urology and he says that he will do this.  At this time we do think he can resume antiplatelet therapy in stages with aspirin   first.  We discussed this plan and the patient was able to teach back. - Start aspirin  81 mg daily and if no return of hematuria in 1 week restart Plavix  75 mg daily - [facility] Chronic respiratory failure with hypoxia (HCC) The pending the patient wanted to talk about today was his portable oxygen concentrator.  He has COPD and heart failure with chronic hypoxic  respiratory failure on 3 L of supplemental oxygen at home.  He does not like to care around his oxygen tanks and usually leaves the house without his oxygen.  This causes significant dyspnea.  Today in the office his ambulatory saturations dropped down to 86%.  New referral for portable oxygen tank was placed.  Also encouraged him to follow-up with pulmonology.  Orders Placed This Encounter  Procedures   AMB REFERRAL FOR DME    Referral Priority:   Routine    Referral Type:   Durable Medical Equipment Purchase    Number of Visits Requested:   1     Return in about 4 weeks (around 07/20/2024) for General Follow Up.   Patient discussed with Dr. Mliss Foot  Fairy Pool, DO [facility] Internal Medicine Resident PGY-3 Clinic Phone: [phone] Please contact the on call pager at [phone] for any urgent or emergent needs. "

## 2024-06-23 ENCOUNTER — Other Ambulatory Visit: Payer: Self-pay

## 2024-06-23 ENCOUNTER — Other Ambulatory Visit: Payer: Self-pay | Admitting: Internal Medicine

## 2024-06-23 DIAGNOSIS — I7142 Juxtarenal abdominal aortic aneurysm, without rupture: Secondary | ICD-10-CM

## 2024-06-23 DIAGNOSIS — Z79891 Long term (current) use of opiate analgesic: Secondary | ICD-10-CM

## 2024-06-23 DIAGNOSIS — M25551 Pain in right hip: Secondary | ICD-10-CM

## 2024-06-23 DIAGNOSIS — Z91013 Allergy to seafood: Secondary | ICD-10-CM

## 2024-06-23 MED ORDER — PREDNISONE 50 MG PO TABS: ORAL_TABLET | ORAL | 0 refills | Status: AC

## 2024-06-23 MED ORDER — DIPHENHYDRAMINE HCL 50 MG PO CAPS: ORAL_CAPSULE | ORAL | 0 refills | Status: AC

## 2024-06-23 NOTE — Transitions of Care (Post Inpatient/ED Visit) (Signed)
 " Transition of Care week 3  Visit Note  06/23/2024  Name: [name] MRN: [id number]          DOB: September 18, 1945  Situation: Patient enrolled in [facility] 30-day program. Visit completed with [name] by telephone.   Background:   Initial Transition Care Management Follow-up Telephone Call Discharge Date and Diagnosis: 05/29/24, CHF   Past Medical History:  Diagnosis Date   AAA (abdominal aortic aneurysm)    followed by vasc surgery   Alcohol abuse    Barretts esophagus    bx distal esophagus 2011 neg    Bladder outlet obstruction 04/02/2018   CAD in native artery    a. s/p stent to prox RCA and mid Cx.   Cataract    Cholelithiasis    Chronic osteomyelitis involving ankle and foot, right (HCC) 07/13/2018   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   2006 - 3 adenomas largest 12 mm  2008 - no polyps 2013-  2 mm cecal polyp, adenoma, routine repeat colonoscopy 02/2017 approx 05/14/2017 18 polyps max 12 mm ALL adenomas recall 2019 [name], MD, [facility]      Dieulafoy lesion of duodenum    Diverticulosis of colon    DJD (degenerative joint disease)    left hip   Full dentures    upper and lower   GERD (gastroesophageal reflux disease)    diet controlled    Gout    feet   Hiatal hernia    2 cm noted on upper endos 2011    Hx of adenomatous colonic polyps    Hyperlipidemia    Hypertension    Hypoxia 05/31/2024   Internal hemorrhoid    Lung cancer (HCC)    squamous cell (T2N0MX), 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 pt was on Plavix  x 3 years then transitioned to ASA, no problems since   Non-small cell carcinoma of right lung, stage 1 (HCC) 01/30/2016   Renal failure, acute    hx of 2/2 medication, resolved   Tobacco abuse    smoker .5 ppd x 55 yrs   UTI (lower urinary tract infection)     Assessment: Patient Reported Symptoms: Cognitive Cognitive Status: Alert and oriented to person, place, and time,  Normal speech and language skills      Neurological Neurological Review of Symptoms: No symptoms reported    HEENT HEENT Symptoms Reported: No symptoms reported      Cardiovascular Cardiovascular Symptoms Reported: No symptoms reported Does patient have uncontrolled Hypertension?: No Cardiovascular Management Strategies: Medication therapy, Routine screening, Weight management, Activity, Coping strategies Weight: 134 lb (60.8 kg) Cardiovascular Comment: The patient's weight flucuates around 134 - 136lbs. He is 6' tall. He states he does eat.  Respiratory Respiratory Symptoms Reported: Dry cough Additional Respiratory Details: The patient is waiting on his portable oxygen. He continues to wear oxygen at 3l/min. The patient states he is active Respiratory Management Strategies: Oxygen therapy, Routine screening, Adequate rest, Medication therapy  Endocrine Endocrine Symptoms Reported: Not assessed    Gastrointestinal Gastrointestinal Symptoms Reported: No symptoms reported      Genitourinary Genitourinary Symptoms Reported: Blood in urine Additional Genitourinary Details: Blood in urine has cleared up. Genitourinary Management Strategies: Catheter, indwelling Indwelling Catheter Inserted: 06/23/24 Genitourinary Comment: Suprapubic  Integumentary Integumentary Symptoms Reported: No symptoms reported    Musculoskeletal Musculoskelatal Symptoms Reviewed: Joint pain Additional Musculoskeletal Details: Hip pain Musculoskeletal Management Strategies: Routine screening, Medication  therapy, Adequate rest      Psychosocial Psychosocial Symptoms Reported: No symptoms reported         Today's Vitals   06/23/24 1320  Weight: 134 lb (60.8 kg)      Medications Reviewed Today     Reviewed by Moises Reusing, RN (Case Manager) on 06/23/24 at 1313  Med List Status: <None>   Medication Order Taking? Sig Documenting Provider Last Dose Status Informant  acetaminophen  (TYLENOL ) 500 MG  tablet [id number]  Take 500 mg by mouth every 6 (six) hours as needed for moderate pain (pain score 4-6). [provider]  Active Self, Pharmacy Records  albuterol  (VENTOLIN  HFA) [phone] Base) MCG/ACT inhaler [id number]  Inhale 1-2 puffs into the lungs every 6 (six) hours as needed for wheezing or shortness of breath. [name], DO  Active Self, Pharmacy Records  amLODipine  (NORVASC ) 10 MG tablet [id number]  Take 1 tablet (10 mg total) by mouth daily. [name], DO  Active Self, Pharmacy Records  aspirin  EC 81 MG tablet [id number]  Take 1 tablet (81 mg total) by mouth daily. Swallow whole. [name] ORN, MD  Active Self, Pharmacy Records  clopidogrel  (PLAVIX ) 75 MG tablet [id number]  Take 1 tablet (75 mg total) by mouth daily. Bethanie Cough, PA-C  Active Self, Pharmacy Records  diphenhydrAMINE  (BENADRYL ) 50 MG capsule [id number]  Take one capsule 1 hour prior to scan. [name] ORN, MD  Active   escitalopram  (LEXAPRO ) 5 MG tablet [id number] Yes TAKE 1 TABLET (5 MG TOTAL) BY MOUTH DAILY. [name], DO  Active Self, Pharmacy Records  furosemide  (LASIX ) 40 MG tablet [id number]  Take 1 tablet (40 mg total) by mouth daily. Harrie Bruckner, DO  Active   HYDROcodone -acetaminophen  (NORCO) 7.5-325 MG tablet [id number]  Take 1 tablet by mouth 2 (two) times daily as needed for moderate pain (pain score 4-6). Fill 30 days after last fill. [name], DO  Active Self, Pharmacy Records  metoprolol  succinate (TOPROL -XL) 25 MG 24 hr tablet [id number]  Take 1 tablet (25 mg total) by mouth daily. [name], DO  Active Self, Pharmacy Records  mirtazapine  (REMERON ) 7.5 MG tablet [id number]  Take 2 tablets (15 mg total) by mouth at bedtime. [name], DO  Active Self, Pharmacy Records  OXYGEN [id number]  Inhale 3 L/min into the lungs continuous. [provider]  Active   Pancrelipase , Lip-Prot-Amyl, (ZENPEP ) 20000-63000 units CPEP [id number]  Take 3 capsules by mouth 3  (three) times daily before meals. [name], DO  Active Self, Pharmacy Records  pantoprazole  (PROTONIX ) 40 MG tablet [id number]  TAKE 1 TABLET EVERY DAY FOR REFLUX [name], DO  Active Self, Pharmacy Records  predniSONE  (DELTASONE ) 50 MG tablet [id number]  Take one tablet 13 hours, 7 hours, and 1 hour prior to scan. [name] ORN, MD  Active   rosuvastatin  (CRESTOR ) 40 MG tablet [id number]  Take 1 tablet (40 mg total) by mouth daily. Jerrell Cleatus Ned, MD  Active Self, Pharmacy Records  sacubitril -valsartan  (ENTRESTO ) 97-103 MG [id number]  Take 1 tablet by mouth 2 (two) times daily. [name], DO  Active Self, Pharmacy Records  senna (SENOKOT) 8.6 MG TABS tablet [id number]  Take 1 tablet (8.6 mg total) by mouth in the morning and at bedtime. Smucker, Melvenia, MD  Active Self, Pharmacy Records  spironolactone  (ALDACTONE ) 25 MG tablet [id number]  Take 0.5 tablets (12.5 mg total) by mouth daily. [name], Callie E, PA-C  Active  Tiotropium Bromide-Olodaterol (STIOLTO RESPIMAT ) 2.5-2.5 MCG/ACT AERS [id number]  Inhale 2 puffs into the lungs daily. Hattar, Zola SAILOR, MD  Active Self, Pharmacy Records            Recommendation:   Continue Current Plan of Care  Follow Up Plan:   Telephone follow-up in 1 week  Medford Balboa, BSN, RN [place]  VBCI - [facility] RN Care Manager [phone]     "

## 2024-06-23 NOTE — Patient Instructions (Signed)
 Visit Information  Thank you for taking time to visit with me today. Please don't hesitate to contact me if I can be of assistance to you before our next scheduled telephone appointment.  Our next appointment is by telephone on Wednesday January 7th at 1:30pm  Following is a copy of your care plan:   Goals Addressed             This Visit's Progress    VBCI Transitions of Care (TOC) Care Plan       Problems: (reviewed 06/23/24) Recent Hospitalization for treatment of CHF Hospital or Ed Adm Risk is 99%  Goal: (reviewed 06/23/24) Over the next 30 days, the patient will not experience hospital readmission  Interventions: (reviewed 06/23/24)  Heart Failure Interventions:(reviewed 06/23/24) Basic overview and discussion of pathophysiology of Heart Failure reviewed Provided education on low sodium diet Assessed need for readable accurate scales in home Provided education about placing scale on hard, flat surface Advised patient to weigh each morning after emptying bladder Discussed importance of daily weight and advised patient to weigh and record daily Reviewed role of diuretics in prevention of fluid overload and management of heart failure; Discussed the importance of keeping all appointments with provider  Take Lasix  40mg  daily  COPD Interventions:(reviewed 06/23/24) Advised patient to track and manage COPD triggers Assessed social determinant of health barriers Discussed the importance of adequate rest and management of fatigue with COPD Provided education about and advised patient to utilize infection prevention strategies to reduce risk of respiratory infection Provided instruction about proper use of medications used for management of COPD including inhalers Use of home oxygen Encourage use of Nicotine  patient and smoking cessation Educated the patient on not using Nicotine  patch if he is smoking Cautioned oxygen and flammatory objects like cigarettes and oxygen Oxygen  3l/min 05/25/24 - The patient went to the ED on 05/24/24 for SOB and COPD exacerbation. He was started on Furosemide  for 7 days 05/31/24 - The patient states he needs to ask the provider about portable oxygen - portable oxygen has been ordered 06/23/24 - The patient has had some blood in his urine. He has a foley catheter. His Plavix  and ASA were held for a couple of days and the urine cleared up. Foley catheter change today  Patient Self Care Activities: (reviewed 06/23/24) Attend all scheduled provider appointments Call pharmacy for medication refills 3-7 days in advance of running out of medications Call provider office for new concerns or questions  Notify RN Care Manager of [facility] call rescheduling needs Participate in Transition of Care Program/Attend [facility] scheduled calls Perform all self care activities independently  Take medications as prescribed    Plan:  Telephone follow up appointment with care management team member scheduled for:  Wednesday January 14th at 1:30pm        Patient verbalizes understanding of instructions and care plan provided today and agrees to view in MyChart. Active MyChart status and patient understanding of how to access instructions and care plan via MyChart confirmed with patient.     The patient has been provided with contact information for the care management team and has been advised to call with any health related questions or concerns.   Please call the care guide team at [phone] if you need to cancel or reschedule your appointment.   Please call the Suicide and Crisis Lifeline: 988 call the [place]  National Suicide Prevention Lifeline: 7024726741 or TTY: (681)420-9374 TTY (475)746-9380) to talk to a trained counselor if you are experiencing  a Mental Health or Behavioral Health Crisis or need someone to talk to.  Medford Balboa, BSN, RN [place]  VBCI - [employer] [facility] RN Care Manager [phone]

## 2024-06-23 NOTE — Telephone Encounter (Signed)
 Copied from CRM [id number]. Topic: Clinical - Medication Refill >> Jun 23, 2024  1:18 PM Debby [name] wrote: Medication: HYDROcodone -acetaminophen  (NORCO) 7.5-325 MG tablet  Has the patient contacted their pharmacy? Yes (Agent: If no, request that the patient contact the pharmacy for the refill. If patient does not wish to contact the pharmacy document the reason why and proceed with request.) (Agent: If yes, when and what did the pharmacy advise?)  This is the patient's preferred pharmacy:  [id number] - [place], [place] - [address] [facility] [address] [place] [address] Phone: [phone] Fax: [phone]  Is this the correct pharmacy for this prescription? Yes If no, delete pharmacy and type the correct one.   Has the prescription been filled recently? No  Is the patient out of the medication? Yes  Has the patient been seen for an appointment in the last year OR does the patient have an upcoming appointment? Yes  Can we respond through MyChart? Yes  Agent: Please be advised that Rx refills may take up to 3 business days. We ask that you follow-up with your pharmacy.

## 2024-06-24 MED ORDER — METOPROLOL SUCCINATE ER 25 MG PO TB24: 25.0000 mg | ORAL_TABLET | Freq: Every day | ORAL | 0 refills | Status: AC

## 2024-06-24 MED ORDER — HYDROCODONE-ACETAMINOPHEN 7.5-325 MG PO TABS: 1.0000 | ORAL_TABLET | Freq: Two times a day (BID) | ORAL | 0 refills | Status: AC | PRN

## 2024-06-24 NOTE — Assessment & Plan Note (Signed)
 Returns today after being seen on 06/08/2024 for persistent hematuria.  He has an indwelling Foley catheter that is changed monthly.  At our prior visit his dual antiplatelet therapy was stopped and he was strongly advised to follow-up with his urologist.  Apparently he had  and urgent visit scheduled on 06/05/2024 but could not go to this and was rescheduled to 06/14/2024.  We discussed this and it does not appear that he went to that visit, although we cannot see the urology office records.  Since stopping his dual antiplatelet therapy his hematuria has resolved and there is clear urine in his bag today.  CBC drawn at his last visit showed stable hemoglobin at 7.8 and platelets improved to 104 from 56 a few weeks prior.  Discussed the importance of following up with urology and he says that he will do this.  At this time we do think he can resume antiplatelet therapy in stages with aspirin  first.  We discussed this plan and the patient was able to teach back. - Start aspirin  81 mg daily and if no return of hematuria in 1 week restart Plavix  75 mg daily - [facility]

## 2024-06-28 ENCOUNTER — Ambulatory Visit: Payer: Self-pay | Admitting: Pulmonary Disease

## 2024-06-28 LAB — ACID FAST CULTURE WITH REFLEXED SENSITIVITIES (MYCOBACTERIA)
Acid Fast Culture: NEGATIVE
Source of Sample: 183526

## 2024-06-28 NOTE — Progress Notes (Signed)
 Internal Medicine Clinic Attending  Case discussed with the resident at the time of the visit.  We reviewed the resident's history and exam and pertinent patient test results.  I agree with the assessment, diagnosis, and plan of care documented in the resident's note.

## 2024-06-30 ENCOUNTER — Other Ambulatory Visit: Payer: Self-pay

## 2024-07-01 ENCOUNTER — Other Ambulatory Visit: Payer: Self-pay

## 2024-07-01 DIAGNOSIS — I2510 Atherosclerotic heart disease of native coronary artery without angina pectoris: Secondary | ICD-10-CM

## 2024-07-02 ENCOUNTER — Other Ambulatory Visit: Payer: Self-pay | Admitting: Surgery

## 2024-07-02 ENCOUNTER — Other Ambulatory Visit: Payer: Self-pay | Admitting: Internal Medicine

## 2024-07-02 DIAGNOSIS — J439 Emphysema, unspecified: Secondary | ICD-10-CM

## 2024-07-02 DIAGNOSIS — Z91013 Allergy to seafood: Secondary | ICD-10-CM

## 2024-07-02 DIAGNOSIS — J9621 Acute and chronic respiratory failure with hypoxia: Secondary | ICD-10-CM

## 2024-07-05 ENCOUNTER — Other Ambulatory Visit

## 2024-07-05 ENCOUNTER — Other Ambulatory Visit: Payer: Self-pay | Admitting: Internal Medicine

## 2024-07-05 MED ORDER — ZENPEP 20000-63000 UNITS PO CPEP: 3.0000 | ORAL_CAPSULE | Freq: Three times a day (TID) | ORAL | 0 refills | Status: AC

## 2024-07-05 NOTE — Patient Instructions (Signed)
 Visit Information  Thank you for taking time to visit with me today. Please don't hesitate to contact me if I can be of assistance to you before our next scheduled telephone appointment.  Our next appointment is by telephone on Tuesday January 27th at 2:00pm  Following is a copy of your care plan:   Goals Addressed             This Visit's Progress    VBCI Transitions of Care (TOC) Care Plan       Problems: (reviewed 07/05/24) Recent Hospitalization for treatment of CHF Hospital or Ed Adm Risk is 99%  Goal: (reviewed 07/05/24) Over the next 30 days, the patient will not experience hospital readmission  Interventions: (reviewed 07/05/24)  Heart Failure Interventions:(reviewed 07/05/24) Basic overview and discussion of pathophysiology of Heart Failure reviewed Provided education on low sodium diet Assessed need for readable accurate scales in home Provided education about placing scale on hard, flat surface Advised patient to weigh each morning after emptying bladder Discussed importance of daily weight and advised patient to weigh and record daily Reviewed role of diuretics in prevention of fluid overload and management of heart failure; Discussed the importance of keeping all appointments with provider  Take Lasix  40mg  daily  COPD Interventions:(reviewed 07/05/24) Advised patient to track and manage COPD triggers Assessed social determinant of health barriers Discussed the importance of adequate rest and management of fatigue with COPD Provided education about and advised patient to utilize infection prevention strategies to reduce risk of respiratory infection Provided instruction about proper use of medications used for management of COPD including inhalers Use of home oxygen Encourage use of Nicotine  patient and smoking cessation Educated the patient on not using Nicotine  patch if he is smoking Cautioned oxygen and flammatory objects like cigarettes and oxygen Oxygen  3l/min 05/25/24 - The patient went to the ED on 05/24/24 for SOB and COPD exacerbation. He was started on Furosemide  for 7 days 05/31/24 - The patient states he needs to ask the provider about portable oxygen - portable oxygen has been ordered 06/23/24 - The patient has had some blood in his urine. He has a foley catheter. His Plavix  and ASA were held for a couple of days and the urine cleared up. Foley catheter change today  Patient Self Care Activities: (reviewed 07/05/24) Attend all scheduled provider appointments Call pharmacy for medication refills 3-7 days in advance of running out of medications Call provider office for new concerns or questions  Notify RN Care Manager of [facility] call rescheduling needs Participate in Transition of Care Program/Attend [facility] scheduled calls Perform all self care activities independently  Take medications as prescribed    Plan:  Telephone follow up appointment with care management team member scheduled for:  Tuesday January 27th at 2:00pm        The patient verbalized understanding of instructions, educational materials, and care plan provided today and agreed to receive a mailed copy of patient instructions, educational materials, and care plan.   The patient has been provided with contact information for the care management team and has been advised to call with any health related questions or concerns.   Please call the care guide team at [phone] if you need to cancel or reschedule your appointment.   Please call the Suicide and Crisis Lifeline: 988 call the [place]  National Suicide Prevention Lifeline: 650-331-9847 or TTY: 628-657-8118 TTY (207)556-8054) to talk to a trained counselor if you are experiencing a Mental Health or Behavioral Health Crisis or need  someone to talk to.  Medford Balboa, BSN, RN [place]  VBCI - [employer] [facility] RN Care Manager [phone]

## 2024-07-05 NOTE — Transitions of Care (Post Inpatient/ED Visit) (Signed)
 " Transition of Care week 4  Visit Note  07/05/2024  Name: [name] MRN: [id number]          DOB: 1946/03/24  Situation: Patient enrolled in [facility] 30-day program. Visit completed with [name] by telephone.   Background:   Initial Transition Care Management Follow-up Telephone Call Discharge Date and Diagnosis: No data recorded   Past Medical History:  Diagnosis Date   AAA (abdominal aortic aneurysm)    followed by vasc surgery   Alcohol abuse    Barretts esophagus    bx distal esophagus 2011 neg    Bladder outlet obstruction 04/02/2018   CAD in native artery    a. s/p stent to prox RCA and mid Cx.   Cataract    Cholelithiasis    Chronic osteomyelitis involving ankle and foot, right (HCC) 07/13/2018   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   2006 - 3 adenomas largest 12 mm  2008 - no polyps 2013-  2 mm cecal polyp, adenoma, routine repeat colonoscopy 02/2017 approx 05/14/2017 18 polyps max 12 mm ALL adenomas recall 2019 [name], MD, [facility]      Dieulafoy lesion of duodenum    Diverticulosis of colon    DJD (degenerative joint disease)    left hip   Full dentures    upper and lower   GERD (gastroesophageal reflux disease)    diet controlled    Gout    feet   Hiatal hernia    2 cm noted on upper endos 2011    Hx of adenomatous colonic polyps    Hyperlipidemia    Hypertension    Hypoxia 05/31/2024   Internal hemorrhoid    Lung cancer (HCC)    squamous cell (T2N0MX), 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 pt was on Plavix  x 3 years then transitioned to ASA, no problems since   Non-small cell carcinoma of right lung, stage 1 (HCC) 01/30/2016   Renal failure, acute    hx of 2/2 medication, resolved   Tobacco abuse    smoker .5 ppd x 55 yrs   UTI (lower urinary tract infection)     Assessment: Patient Reported Symptoms: Cognitive Cognitive Status: Alert and oriented to person, place, and  time, Normal speech and language skills      Neurological Neurological Review of Symptoms: No symptoms reported    HEENT HEENT Symptoms Reported: Not assessed      Cardiovascular Cardiovascular Symptoms Reported: No symptoms reported Does patient have uncontrolled Hypertension?: No Cardiovascular Management Strategies: Medication therapy, Routine screening, Coping strategies, Activity  Respiratory Respiratory Symptoms Reported: Dry cough Additional Respiratory Details: The patient has not heard back from the [employer] company regarding his portable oxygen. CM to follow up with [employer] and PCP Respiratory Management Strategies: Oxygen therapy, Routine screening, Adequate rest, Medication therapy  Endocrine Endocrine Symptoms Reported: Not assessed    Gastrointestinal Gastrointestinal Symptoms Reported: Not assessed      Genitourinary Genitourinary Symptoms Reported: No symptoms reported Genitourinary Management Strategies: Catheter, indwelling Indwelling Catheter Inserted: 06/23/24 Genitourinary Comment: Suprapubic  Integumentary Integumentary Symptoms Reported: Not assessed    Musculoskeletal Musculoskelatal Symptoms Reviewed: Not assessed        Psychosocial Psychosocial Symptoms Reported: Not assessed         There were no vitals filed for this visit.    Medications Reviewed Today     Reviewed by Moises Reusing, RN (Case Manager) on  07/05/24 at 1550  Med List Status: <None>   Medication Order Taking? Sig Documenting Provider Last Dose Status Informant  acetaminophen  (TYLENOL ) 500 MG tablet [id number]  Take 500 mg by mouth every 6 (six) hours as needed for moderate pain (pain score 4-6). [provider]  Active Self, Pharmacy Records  albuterol  (VENTOLIN  HFA) 108 (90 Base) MCG/ACT inhaler [id number]  Inhale 1-2 puffs into the lungs every 6 (six) hours as needed for wheezing or shortness of breath. [name], DO  Active Self, Pharmacy Records  amLODipine   (NORVASC ) 10 MG tablet [id number]  Take 1 tablet (10 mg total) by mouth daily. [name], DO  Active Self, Pharmacy Records  aspirin  EC 81 MG tablet [id number]  Take 1 tablet (81 mg total) by mouth daily. Swallow whole. [name] ORN, MD  Active Self, Pharmacy Records  clopidogrel  (PLAVIX ) 75 MG tablet [id number]  Take 1 tablet (75 mg total) by mouth daily. Bethanie Cough, PA-C  Active Self, Pharmacy Records  diphenhydrAMINE  (BENADRYL ) 50 MG capsule [id number]  Take one capsule 1 hour prior to scan. [name] ORN, MD  Active   escitalopram  (LEXAPRO ) 5 MG tablet [id number]  TAKE 1 TABLET (5 MG TOTAL) BY MOUTH DAILY. [name], DO  Expired 06/23/24 2359 Self, Pharmacy Records  furosemide  (LASIX ) 40 MG tablet [id number]  Take 1 tablet (40 mg total) by mouth daily. Harrie Bruckner, DO  Active   HYDROcodone -acetaminophen  (NORCO) 7.5-325 MG tablet [id number]  Take 1 tablet by mouth 2 (two) times daily as needed for moderate pain (pain score 4-6). Fill 30 days after last fill. [name], DO  Active   metoprolol  succinate (TOPROL -XL) 25 MG 24 hr tablet [id number]  Take 1 tablet (25 mg total) by mouth daily. [name], DO  Active   mirtazapine  (REMERON ) 7.5 MG tablet [id number]  Take 2 tablets (15 mg total) by mouth at bedtime. [name], DO  Active Self, Pharmacy Records  OXYGEN [id number]  Inhale 3 L/min into the lungs continuous. [provider]  Active   Pancrelipase , Lip-Prot-Amyl, (ZENPEP ) 20000-63000 units CPEP [id number]  Take 3 capsules by mouth 3 (three) times daily before meals. [name], DO  Active Self, Pharmacy Records  pantoprazole  (PROTONIX ) 40 MG tablet [id number]  TAKE 1 TABLET EVERY DAY FOR REFLUX [name], DO  Active Self, Pharmacy Records  predniSONE  (DELTASONE ) 50 MG tablet [id number]  Take one tablet 13 hours, 7 hours, and 1 hour prior to scan. [name] ORN, MD  Active   rosuvastatin  (CRESTOR ) 40 MG tablet [id number]  Take 1 tablet (40  mg total) by mouth daily. Jerrell Cleatus Ned, MD  Active Self, Pharmacy Records  sacubitril -valsartan  (ENTRESTO ) 97-103 MG [id number]  Take 1 tablet by mouth 2 (two) times daily. [name], DO  Active Self, Pharmacy Records  senna (SENOKOT) 8.6 MG TABS tablet [id number]  Take 1 tablet (8.6 mg total) by mouth in the morning and at bedtime. Smucker, Melvenia, MD  Active Self, Pharmacy Records  spironolactone  (ALDACTONE ) 25 MG tablet [id number]  Take 0.5 tablets (12.5 mg total) by mouth daily. [name], Callie E, PA-C  Active   Tiotropium Bromide-Olodaterol (STIOLTO RESPIMAT ) 2.5-2.5 MCG/ACT AERS [id number]  Inhale 2 puffs into the lungs daily. Hattar, Zola SAILOR, MD  Active Self, Pharmacy Records            Recommendation:   Continue Current Plan of Care  Follow Up Plan:   Telephone follow-up in 1 week  [place]  Dinora Hemm, BSN, RN [place]  [facility]BC[facility] - Population Health RN Care Manager [phone]     "

## 2024-07-05 NOTE — Telephone Encounter (Unsigned)
 Copied from CRM [id number]. Topic: Clinical - Medication Refill >> Jul 05, 2024  7:52 AM Farrel B wrote: Medication: Pancrelipase , Lip-Prot-Amyl, (ZENPEP ) 20000-63000 units CPEP  Has the patient contacted their pharmacy? Yes Stated the provided needed to send one in  This is the patient's preferred pharmacy:  [id number] - [place], [place] - [address] AT [facility] [address] [place] [address] Phone: [phone] Fax: [phone]   Is this the correct pharmacy for this prescription? Yes If no, delete pharmacy and type the correct one.   Has the prescription been filled recently?12/25  Is the patient out of the medication? 2 days left   Has the patient been seen for an appointment in the last year OR does the patient have an upcoming appointment? Yes  Can we respond through MyChart? No  Agent: Please be advised that Rx refills may take up to 3 business days. We ask that you follow-up with your pharmacy.

## 2024-07-08 ENCOUNTER — Other Ambulatory Visit: Payer: Self-pay | Admitting: *Deleted

## 2024-07-08 DIAGNOSIS — F32A Depression, unspecified: Secondary | ICD-10-CM

## 2024-07-08 MED ORDER — ESCITALOPRAM OXALATE 5 MG PO TABS: 5.0000 mg | ORAL_TABLET | Freq: Every day | ORAL | 1 refills | Status: AC

## 2024-07-11 ENCOUNTER — Emergency Department (HOSPITAL_COMMUNITY)

## 2024-07-11 ENCOUNTER — Other Ambulatory Visit: Payer: Self-pay

## 2024-07-11 ENCOUNTER — Inpatient Hospital Stay (HOSPITAL_COMMUNITY)
Admission: EM | Admit: 2024-07-11 | Source: Home / Self Care | Attending: Internal Medicine | Admitting: Internal Medicine

## 2024-07-11 ENCOUNTER — Encounter (HOSPITAL_COMMUNITY): Payer: Self-pay | Admitting: Emergency Medicine

## 2024-07-11 DIAGNOSIS — R64 Cachexia: Secondary | ICD-10-CM | POA: Diagnosis not present

## 2024-07-11 DIAGNOSIS — J9612 Chronic respiratory failure with hypercapnia: Secondary | ICD-10-CM | POA: Diagnosis not present

## 2024-07-11 DIAGNOSIS — Z860101 Personal history of adenomatous and serrated colon polyps: Secondary | ICD-10-CM

## 2024-07-11 DIAGNOSIS — I4819 Other persistent atrial fibrillation: Secondary | ICD-10-CM

## 2024-07-11 DIAGNOSIS — J9584 Transfusion-related acute lung injury (TRALI): Secondary | ICD-10-CM

## 2024-07-11 DIAGNOSIS — I5020 Unspecified systolic (congestive) heart failure: Secondary | ICD-10-CM

## 2024-07-11 DIAGNOSIS — K219 Gastro-esophageal reflux disease without esophagitis: Secondary | ICD-10-CM | POA: Diagnosis not present

## 2024-07-11 DIAGNOSIS — K2960 Other gastritis without bleeding: Secondary | ICD-10-CM

## 2024-07-11 DIAGNOSIS — I700 Atherosclerosis of aorta: Secondary | ICD-10-CM | POA: Diagnosis not present

## 2024-07-11 DIAGNOSIS — D62 Acute posthemorrhagic anemia: Secondary | ICD-10-CM | POA: Diagnosis present

## 2024-07-11 DIAGNOSIS — R0902 Hypoxemia: Principal | ICD-10-CM

## 2024-07-11 DIAGNOSIS — F17210 Nicotine dependence, cigarettes, uncomplicated: Secondary | ICD-10-CM | POA: Diagnosis not present

## 2024-07-11 DIAGNOSIS — I719 Aortic aneurysm of unspecified site, without rupture: Secondary | ICD-10-CM | POA: Diagnosis not present

## 2024-07-11 DIAGNOSIS — I130 Hypertensive heart and chronic kidney disease with heart failure and stage 1 through stage 4 chronic kidney disease, or unspecified chronic kidney disease: Secondary | ICD-10-CM | POA: Diagnosis not present

## 2024-07-11 DIAGNOSIS — K635 Polyp of colon: Secondary | ICD-10-CM

## 2024-07-11 DIAGNOSIS — D649 Anemia, unspecified: Secondary | ICD-10-CM | POA: Diagnosis present

## 2024-07-11 DIAGNOSIS — N183 Chronic kidney disease, stage 3 unspecified: Secondary | ICD-10-CM | POA: Diagnosis not present

## 2024-07-11 DIAGNOSIS — J449 Chronic obstructive pulmonary disease, unspecified: Secondary | ICD-10-CM | POA: Diagnosis not present

## 2024-07-11 DIAGNOSIS — Z7902 Long term (current) use of antithrombotics/antiplatelets: Secondary | ICD-10-CM

## 2024-07-11 DIAGNOSIS — E43 Unspecified severe protein-calorie malnutrition: Secondary | ICD-10-CM | POA: Insufficient documentation

## 2024-07-11 DIAGNOSIS — E785 Hyperlipidemia, unspecified: Secondary | ICD-10-CM

## 2024-07-11 DIAGNOSIS — R079 Chest pain, unspecified: Secondary | ICD-10-CM

## 2024-07-11 DIAGNOSIS — R0602 Shortness of breath: Secondary | ICD-10-CM

## 2024-07-11 DIAGNOSIS — K250 Acute gastric ulcer with hemorrhage: Secondary | ICD-10-CM

## 2024-07-11 DIAGNOSIS — J9601 Acute respiratory failure with hypoxia: Secondary | ICD-10-CM | POA: Diagnosis present

## 2024-07-11 LAB — COMPREHENSIVE METABOLIC PANEL WITH GFR
ALT: 53 U/L — ABNORMAL HIGH (ref 0–44)
AST: 74 U/L — ABNORMAL HIGH (ref 15–41)
Albumin: 3.9 g/dL (ref 3.5–5.0)
Alkaline Phosphatase: 100 U/L (ref 38–126)
Anion gap: 13 (ref 5–15)
BUN: 21 mg/dL (ref 8–23)
CO2: 21 mmol/L — ABNORMAL LOW (ref 22–32)
Calcium: 8.7 mg/dL — ABNORMAL LOW (ref 8.9–10.3)
Chloride: 107 mmol/L (ref 98–111)
Creatinine, Ser: 1.5 mg/dL — ABNORMAL HIGH (ref 0.61–1.24)
GFR, Estimated: 47 mL/min — ABNORMAL LOW
Glucose, Bld: 103 mg/dL — ABNORMAL HIGH (ref 70–99)
Potassium: 3.9 mmol/L (ref 3.5–5.1)
Sodium: 141 mmol/L (ref 135–145)
Total Bilirubin: 0.5 mg/dL (ref 0.0–1.2)
Total Protein: 7.1 g/dL (ref 6.5–8.1)

## 2024-07-11 LAB — CBC WITH DIFFERENTIAL/PLATELET
Abs Immature Granulocytes: 0.02 10*3/uL (ref 0.00–0.07)
Basophils Absolute: 0 10*3/uL (ref 0.0–0.1)
Basophils Relative: 0 %
Eosinophils Absolute: 0.1 10*3/uL (ref 0.0–0.5)
Eosinophils Relative: 1 %
HCT: 30.7 % — ABNORMAL LOW (ref 39.0–52.0)
Hemoglobin: 9.3 g/dL — ABNORMAL LOW (ref 13.0–17.0)
Immature Granulocytes: 0 %
Lymphocytes Relative: 31 %
Lymphs Abs: 1.9 10*3/uL (ref 0.7–4.0)
MCH: 31.4 pg (ref 26.0–34.0)
MCHC: 30.3 g/dL (ref 30.0–36.0)
MCV: 103.7 fL — ABNORMAL HIGH (ref 80.0–100.0)
Monocytes Absolute: 0.4 10*3/uL (ref 0.1–1.0)
Monocytes Relative: 6 %
Neutro Abs: 3.9 10*3/uL (ref 1.7–7.7)
Neutrophils Relative %: 62 %
Platelets: 93 10*3/uL — ABNORMAL LOW (ref 150–400)
RBC: 2.96 MIL/uL — ABNORMAL LOW (ref 4.22–5.81)
RDW: 16 % — ABNORMAL HIGH (ref 11.5–15.5)
WBC: 6.3 10*3/uL (ref 4.0–10.5)
nRBC: 0 % (ref 0.0–0.2)

## 2024-07-11 LAB — I-STAT VENOUS BLOOD GAS, ED
Acid-base deficit: 3 mmol/L — ABNORMAL HIGH (ref 0.0–2.0)
Bicarbonate: 23.9 mmol/L (ref 20.0–28.0)
Calcium, Ion: 1.16 mmol/L (ref 1.15–1.40)
HCT: 30 % — ABNORMAL LOW (ref 39.0–52.0)
Hemoglobin: 10.2 g/dL — ABNORMAL LOW (ref 13.0–17.0)
O2 Saturation: 61 %
Potassium: 3.7 mmol/L (ref 3.5–5.1)
Sodium: 144 mmol/L (ref 135–145)
TCO2: 25 mmol/L (ref 22–32)
pCO2, Ven: 49.6 mmHg (ref 44–60)
pH, Ven: 7.29 (ref 7.25–7.43)
pO2, Ven: 36 mmHg (ref 32–45)

## 2024-07-11 LAB — I-STAT CHEM 8, ED
BUN: 22 mg/dL (ref 8–23)
Calcium, Ion: 1.18 mmol/L (ref 1.15–1.40)
Chloride: 108 mmol/L (ref 98–111)
Creatinine, Ser: 1.6 mg/dL — ABNORMAL HIGH (ref 0.61–1.24)
Glucose, Bld: 102 mg/dL — ABNORMAL HIGH (ref 70–99)
HCT: 32 % — ABNORMAL LOW (ref 39.0–52.0)
Hemoglobin: 10.9 g/dL — ABNORMAL LOW (ref 13.0–17.0)
Potassium: 3.7 mmol/L (ref 3.5–5.1)
Sodium: 144 mmol/L (ref 135–145)
TCO2: 23 mmol/L (ref 22–32)

## 2024-07-11 LAB — PRO BRAIN NATRIURETIC PEPTIDE: Pro Brain Natriuretic Peptide: 8228 pg/mL — ABNORMAL HIGH

## 2024-07-11 LAB — TROPONIN T, HIGH SENSITIVITY
Troponin T High Sensitivity: 70 ng/L — ABNORMAL HIGH (ref 0–19)
Troponin T High Sensitivity: 76 ng/L — ABNORMAL HIGH (ref 0–19)

## 2024-07-11 MED ORDER — METHYLPREDNISOLONE SODIUM SUCC 125 MG IJ SOLR
125.0000 mg | Freq: Once | INTRAMUSCULAR | Status: AC
  Administered 2024-07-11: 125 mg via INTRAVENOUS
  Filled 2024-07-11: qty 2

## 2024-07-11 MED ORDER — UMECLIDINIUM BROMIDE 62.5 MCG/ACT IN AEPB
1.0000 | INHALATION_SPRAY | Freq: Every day | RESPIRATORY_TRACT | Status: DC
  Administered 2024-07-11 – 2024-07-21 (×10): 1 via RESPIRATORY_TRACT
  Filled 2024-07-11 (×3): qty 7

## 2024-07-11 MED ORDER — PANCRELIPASE (LIP-PROT-AMYL) 12000-38000 UNITS PO CPEP
60000.0000 [IU] | ORAL_CAPSULE | Freq: Three times a day (TID) | ORAL | Status: DC
  Administered 2024-07-11 – 2024-07-21 (×22): 60000 [IU] via ORAL
  Filled 2024-07-11 (×34): qty 5

## 2024-07-11 MED ORDER — ENOXAPARIN SODIUM 40 MG/0.4ML IJ SOSY
40.0000 mg | PREFILLED_SYRINGE | INTRAMUSCULAR | Status: DC
  Administered 2024-07-11 – 2024-07-12 (×2): 40 mg via SUBCUTANEOUS
  Filled 2024-07-11 (×2): qty 0.4

## 2024-07-11 MED ORDER — NICOTINE 14 MG/24HR TD PT24: 14.0000 mg | MEDICATED_PATCH | Freq: Every day | TRANSDERMAL | Status: DC

## 2024-07-11 MED ORDER — POLYETHYLENE GLYCOL 3350 17 G PO PACK: 17.0000 g | PACK | Freq: Every day | ORAL | Status: DC | PRN

## 2024-07-11 MED ORDER — MIRTAZAPINE 15 MG PO TABS
15.0000 mg | ORAL_TABLET | Freq: Every day | ORAL | Status: DC
  Administered 2024-07-11 – 2024-07-15 (×4): 15 mg via ORAL
  Filled 2024-07-11 (×4): qty 1

## 2024-07-11 MED ORDER — ACETAMINOPHEN 325 MG PO TABS: 650.0000 mg | ORAL_TABLET | Freq: Four times a day (QID) | ORAL | Status: DC | PRN

## 2024-07-11 MED ORDER — PANTOPRAZOLE SODIUM 40 MG PO TBEC
40.0000 mg | DELAYED_RELEASE_TABLET | Freq: Every day | ORAL | Status: DC
  Administered 2024-07-11 – 2024-07-12 (×2): 40 mg via ORAL
  Filled 2024-07-11 (×2): qty 1

## 2024-07-11 MED ORDER — METOPROLOL SUCCINATE ER 25 MG PO TB24
25.0000 mg | ORAL_TABLET | Freq: Every day | ORAL | Status: DC
  Administered 2024-07-11 – 2024-07-12 (×2): 25 mg via ORAL
  Filled 2024-07-11 (×2): qty 1

## 2024-07-11 MED ORDER — ASPIRIN 81 MG PO TBEC
81.0000 mg | DELAYED_RELEASE_TABLET | Freq: Every day | ORAL | Status: DC
  Administered 2024-07-11 – 2024-07-13 (×3): 81 mg via ORAL
  Filled 2024-07-11 (×3): qty 1

## 2024-07-11 MED ORDER — ENSURE PLUS HIGH PROTEIN PO LIQD
237.0000 mL | Freq: Two times a day (BID) | ORAL | Status: DC
  Administered 2024-07-11 – 2024-07-21 (×7): 237 mL via ORAL
  Filled 2024-07-11 (×3): qty 237

## 2024-07-11 MED ORDER — ACETAMINOPHEN 650 MG RE SUPP: 650.0000 mg | Freq: Four times a day (QID) | RECTAL | Status: DC | PRN

## 2024-07-11 MED ORDER — CLOPIDOGREL BISULFATE 75 MG PO TABS
75.0000 mg | ORAL_TABLET | Freq: Every day | ORAL | Status: DC
  Administered 2024-07-11: 75 mg via ORAL
  Filled 2024-07-11: qty 1

## 2024-07-11 MED ORDER — SACUBITRIL-VALSARTAN 97-103 MG PO TABS
1.0000 | ORAL_TABLET | Freq: Two times a day (BID) | ORAL | Status: DC
  Administered 2024-07-11 (×2): 1 via ORAL
  Filled 2024-07-11 (×3): qty 1

## 2024-07-11 MED ORDER — ROSUVASTATIN CALCIUM 20 MG PO TABS
40.0000 mg | ORAL_TABLET | Freq: Every day | ORAL | Status: DC
  Administered 2024-07-11 – 2024-07-21 (×11): 40 mg via ORAL
  Filled 2024-07-11 (×11): qty 2

## 2024-07-11 MED ORDER — ALBUTEROL SULFATE (2.5 MG/3ML) 0.083% IN NEBU
10.0000 mg | INHALATION_SOLUTION | RESPIRATORY_TRACT | Status: DC
  Administered 2024-07-11: 10 mg via RESPIRATORY_TRACT
  Filled 2024-07-11: qty 12

## 2024-07-11 MED ORDER — ARFORMOTEROL TARTRATE 15 MCG/2ML IN NEBU
15.0000 ug | INHALATION_SOLUTION | Freq: Two times a day (BID) | RESPIRATORY_TRACT | Status: AC
  Administered 2024-07-11 – 2024-07-23 (×24): 15 ug via RESPIRATORY_TRACT
  Filled 2024-07-11 (×24): qty 2

## 2024-07-11 MED ORDER — IOHEXOL 350 MG/ML SOLN
75.0000 mL | Freq: Once | INTRAVENOUS | Status: AC | PRN
  Administered 2024-07-11: 75 mL via INTRAVENOUS

## 2024-07-11 MED ORDER — NICOTINE 7 MG/24HR TD PT24
7.0000 mg | MEDICATED_PATCH | Freq: Every day | TRANSDERMAL | Status: AC
  Administered 2024-07-11 – 2024-07-23 (×13): 7 mg via TRANSDERMAL
  Filled 2024-07-11 (×13): qty 1

## 2024-07-11 MED ORDER — ALBUTEROL SULFATE (2.5 MG/3ML) 0.083% IN NEBU: 2.5000 mg | INHALATION_SOLUTION | Freq: Four times a day (QID) | RESPIRATORY_TRACT | Status: AC | PRN

## 2024-07-11 MED ORDER — ESCITALOPRAM OXALATE 10 MG PO TABS
5.0000 mg | ORAL_TABLET | Freq: Every day | ORAL | Status: DC
  Administered 2024-07-11 – 2024-07-15 (×5): 5 mg via ORAL
  Filled 2024-07-11 (×6): qty 1

## 2024-07-11 NOTE — H&P (Addendum)
 " Date: 07/11/2024               Patient Name:  [name] MRN: [id number]  DOB: 05-09-1946 Age / Sex: 79 y.o., male   PCP: [name] Dayton [name], DO         Medical Service: Internal Medicine Teaching Service         Attending Physician: Dr. MICAEL Riis [name]      First Contact: [name] Cheadle, MD    Second Contact: Dr. Missy Sandhoff, MD         Pager Information: First Contact Pager: [phone]   Second Contact Pager: [phone]   SUBJECTIVE   Chief Complaint: Chest pain + Dyspnea  History of Present Illness  [name] is a 79 y.o. 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 Shortness of breath lasted about 15-20 minutes until he called EMS. He did report chest pain to EMS but denied any upon initial evaluation by IMTS. Only other symptom the patient endorsed was dizziness (like room was spinning) when he got up out of bed which appears to be consistent with prior hypoxic episodes per chart review. He wears 3L [place] supplemental O2 at home but cannot take it with him when leaving his house because he doesn't have a portable canister; he is in the currently trying to get one through [facility]. Denies recent sick contacts, fevers/chills, cough, sputum production, chest pain, abdominal pain, leg swelling, N/V/D, constipation at this time. He reports feeling scared when he woke up because he couldn't breathe. Has been taking his Stiolto respimat  daily, takes Albuterol  every so often, no recent increase in use. Reports good oral intake, no decreased appetite or recent weight loss.  ED Course: Hypoxic to 70s [name] home 3L [place] per EMS, improved with Bipap and NRB to 100%. Hgb stable 9.3 with MCV 103. Cr 1.50 (baseline 1.5-1.6). ProBNP elevated to > 8000. VBG with pH 7.29 and pCO2  of 49.6, bicarb 23.9. Troponin 70->76. CTA chest showing possible new aortic endoleak versus dissection.  Stable AAA sac at 6.2 cm. EDP discussed case with vascular surgery (Dr. Pearline) who recommends no acute surgical intervention, imaging findings likely from initial surgery.  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 Acetaminophen  500 mg every 6 hours as needed Albuterol  inhaler 1 - 2 puffs every 6 hours as needed Amlodipine  10 mg daily Aspirin  81 mg daily Plavix  75 mg daily Escitalopram  5 mg daily Lasix  40 mg daily - no longer taking Norco 7.5-325 mg twice daily as needed for moderate pain Metoprolol  succinate (Toprol -XL) 25 mg daily Mirtazapine  15 mg nightly Pancrelipase  3 times daily before meals Protonix  40 mg daily Crestor  40 mg daily Entresto  97-103 mg twice daily Senokot 8.6 mg twice daily Spironolactone  12.5 mg daily Stiolto Respimat  2.5-2.5 mcg/ACT - inhale 2 puffs into the lungs daily - confirmed taking  Allergies  Allergies as of 07/11/2024 - Review Complete 07/11/2024  Allergen Reaction Noted   Candesartan cilexetil-hctz Other (See Comments)    Hydrochlorothiazide Other (See Comments)    Shellfish  allergy Other (See Comments) 03/24/2013   Betadine [povidone iodine ] Itching 08/14/2022    Past Surgical History Past Surgical History:  Procedure Laterality Date   ABDOMINAL AORTIC ENDOVASCULAR FENESTRATED STENT GRAFT N/A 08/16/2022   Procedure: ABDOMINAL AORTIC ENDOVASCULAR FENESTRATED STENT GRAFT;  Surgeon: [name] Gaile ORN, MD;  Location: [facility] OR;  Service: Vascular;  Laterality: N/A;   ABDOMINAL AORTIC ENDOVASCULAR FENESTRATED STENT GRAFT N/A 12/02/2023   Procedure: ABDOMINAL AORTIC ENDOVASCULAR FENESTRATED STENT GRAFT WITH ULTRASOUND  GUIDED ACCESS;  Surgeon: [name] Gaile ORN, MD;  Location: [facility] INVASIVE CV LAB;  Service: Cardiovascular;  Laterality: N/A;   BIOPSY  11/12/2020   Procedure: BIOPSY;  Surgeon: [name] Victory LITTIE DOUGLAS, MD;  Location: [facility] ENDOSCOPY;  Service: Gastroenterology;;   BIOPSY  07/25/2021   Procedure: BIOPSY;  Surgeon: [name] Glendia BRAVO, MD;  Location: [facility] ENDOSCOPY;  Service: Gastroenterology;;   BIOPSY  12/24/2021   Procedure: BIOPSY;  Surgeon: [name] Aloha Raddle., MD;  Location: [facility] ENDOSCOPY;  Service: Gastroenterology;;   BRONCHIAL BIOPSY  07/27/2021   Procedure: BRONCHIAL BIOPSIES;  Surgeon: [name] Lamar RAMAN, MD;  Location: [facility] ENDOSCOPY;  Service: Cardiopulmonary;;   BRONCHIAL BRUSHINGS  07/27/2021   Procedure: BRONCHIAL BRUSHINGS;  Surgeon: [name] Lamar RAMAN, MD;  Location: [facility] ENDOSCOPY;  Service: Cardiopulmonary;;   cataract surgery Bilateral 2018   removal of cataracts   COLONOSCOPY     hx polyps - [name] 02/2012   COLONOSCOPY WITH PROPOFOL  N/A 11/12/2020   Procedure: COLONOSCOPY WITH PROPOFOL ;  Surgeon: [name] Victory LITTIE DOUGLAS, MD;  Location: [facility] ENDOSCOPY;  Service: Gastroenterology;  Laterality: N/A;   CORONARY ANGIOPLASTY WITH STENT PLACEMENT     ENDOVASCULAR STENT INSERTION Bilateral 08/16/2022   Procedure: ENDOVASCULAR STENT GRAFT INSERTION OF BILATERAL RENAL ARTERIES;  Surgeon: [name] Gaile ORN, MD;  Location: [facility] OR;  Service: Vascular;  Laterality: Bilateral;   ESOPHAGOGASTRODUODENOSCOPY (EGD) WITH PROPOFOL  N/A 11/12/2020   Procedure: ESOPHAGOGASTRODUODENOSCOPY (EGD) WITH PROPOFOL ;  Surgeon: [name] Victory LITTIE DOUGLAS, MD;  Location: [facility] ENDOSCOPY;  Service: Gastroenterology;  Laterality: N/A;   ESOPHAGOGASTRODUODENOSCOPY (EGD) WITH PROPOFOL  N/A 03/06/2021   Procedure: ESOPHAGOGASTRODUODENOSCOPY (EGD) WITH PROPOFOL ;  Surgeon: [name] Elspeth SQUIBB, MD;  Location: [facility] ENDOSCOPY;  Service: Gastroenterology;  Laterality: N/A;   ESOPHAGOGASTRODUODENOSCOPY (EGD) WITH PROPOFOL  N/A 07/25/2021   Procedure:  ESOPHAGOGASTRODUODENOSCOPY (EGD) WITH PROPOFOL ;  Surgeon: [name] Glendia BRAVO, MD;  Location: [facility] ENDOSCOPY;  Service: Gastroenterology;  Laterality: N/A;   ESOPHAGOGASTRODUODENOSCOPY (EGD) WITH PROPOFOL  N/A 12/24/2021   Procedure: ESOPHAGOGASTRODUODENOSCOPY (EGD) WITH PROPOFOL ;  Surgeon: [name] Aloha Raddle., MD;  Location: [facility] ENDOSCOPY;  Service: Gastroenterology;  Laterality: N/A;   flexible cystourethroscopy  05/24/06   HEMOSTASIS CLIP PLACEMENT  03/06/2021   Procedure: HEMOSTASIS CLIP PLACEMENT;  Surgeon: [name] Elspeth SQUIBB, MD;  Location: [facility] ENDOSCOPY;  Service: Gastroenterology;;   HEMOSTASIS CLIP PLACEMENT  12/24/2021   Procedure: HEMOSTASIS CLIP PLACEMENT;  Surgeon: [name] Aloha Raddle., MD;  Location: [facility] ENDOSCOPY;  Service: Gastroenterology;;   HOT HEMOSTASIS N/A 03/06/2021   Procedure: HOT HEMOSTASIS (ARGON PLASMA COAGULATION/BICAP);  Surgeon: [name] Elspeth SQUIBB, MD;  Location: [facility] ENDOSCOPY;  Service: Gastroenterology;  Laterality: N/A;   INGUINAL HERNIA REPAIR  2005   INSERTION OF ILIAC STENT Bilateral 08/16/2022   Procedure: INSERTION OF ILIAC STENT RIGHT EXTERNAL ARTERY, LEFT COMMON ARTERY, AND RIGHT COMMON ARTERY;  Surgeon: [name] Gaile ORN, MD;  Location: [facility] OR;  Service: Vascular;  Laterality: Bilateral;   IR CATHETER TUBE CHANGE  07/08/2022   LUNG LOBECTOMY Right 11/07   RUL   MULTIPLE TOOTH EXTRACTIONS  POLYPECTOMY  11/12/2020   Procedure: POLYPECTOMY;  Surgeon: [name] Victory LITTIE DOUGLAS, MD;  Location: [name] ENDOSCOPY;  Service: Gastroenterology;;   POLYPECTOMY  12/24/2021   Procedure: POLYPECTOMY;  Surgeon: [name] Aloha Raddle., MD;  Location: [facility] ENDOSCOPY;  Service: Gastroenterology;;   ROBLEY MEYER INJECTION  12/24/2021   Procedure: SUBMUCOSAL LIFTING INJECTION;  Surgeon: [name] Aloha Raddle., MD;  Location: [facility] ENDOSCOPY;  Service: Gastroenterology;;   TRANSURETHRAL RESECTION OF PROSTATE N/A 05/02/2021   Procedure: TRANSURETHRAL RESECTION OF THE PROSTATE (TURP);   Surgeon: Carolee Sherwood JONETTA DOUGLAS, MD;  Location: [facility] ORS;  Service: Urology;  Laterality: N/A;   ULTRASOUND GUIDANCE FOR VASCULAR ACCESS Bilateral 08/16/2022   Procedure: ULTRASOUND GUIDANCE FOR VASCULAR ACCESS;  Surgeon: [name] Gaile ORN, MD;  Location: [facility] OR;  Service: Vascular;  Laterality: Bilateral;   UPPER ESOPHAGEAL ENDOSCOPIC ULTRASOUND (EUS) N/A 12/24/2021   Procedure: UPPER ESOPHAGEAL ENDOSCOPIC ULTRASOUND (EUS);  Surgeon: [name] Aloha Raddle., MD;  Location: [facility]COPY;  Service: Gastroenterology;  Laterality: N/A;   UPPER GASTROINTESTINAL ENDOSCOPY     VIDEO BRONCHOSCOPY  07/27/2021   Procedure: VIDEO BRONCHOSCOPY WITHOUT FLUORO;  Surgeon: [name] Lamar RAMAN, MD;  Location: [facility] ENDOSCOPY;  Service: Cardiopulmonary;;   VIDEO BRONCHOSCOPY Right 05/14/2024   Procedure: VIDEO BRONCHOSCOPY WITHOUT FLUORO;  Surgeon: Jude Harden GAILS, MD;  Location: [facility] ENDOSCOPY;  Service: Cardiopulmonary;  Laterality: Right;   Social History  Living Situation: Lives in alone in an apartment in [place]  Occupation: Retired, former printmaker  Support: Brother and sister live in town; son lives in [place] Level of Function: Independent, able to complete all ADLs and IADLs PCP: [name] Dayton [name], DO  Substances: -Tobacco: currently smoking 10 cigarettes/day (1 pack lasts 1-2 days); started smoking at age 57 and previously smoked 1 ppd -Alcohol: Denies current use -Recreational Drug: Denies current use  Family History  Family History  Problem Relation Age of Onset   Diabetes Mother    Hypertension Sister    Heart disease Sister    Diabetes Sister    Arthritis Brother    Gout Brother    Colon cancer Neg Hx    Esophageal cancer Neg Hx    Rectal cancer Neg Hx    Stomach cancer Neg Hx    Colon polyps Neg Hx    Inflammatory bowel disease Neg Hx    Liver disease Neg Hx    Pancreatic cancer Neg Hx      Review of Systems  A complete ROS was negative except as per HPI.   OBJECTIVE:   Physical Exam: Blood  pressure 127/67, pulse 68, temperature 97.8 F (36.6 C), temperature source Oral, resp. rate 16, height 6' (1.829 m), weight 60 kg, SpO2 100%.  Constitutional: chronically ill-appearing, cachectic elderly man in no acute distress HENT: normocephalic atraumatic, mucous membranes moist Eyes: conjunctiva non-erythematous, PERRL, no scleral icterus Cardiovascular: irregularly irregular rhythm with normal rate, no m/r/g Pulmonary/Chest: barrel-shaped chest; normal work of breathing [name] home 3L [place], bibasilar crackles posterior lung fields Abdominal: soft, non-tender, non-distended, bowel sounds normal MSK: normal bulk and tone Neurological: drowsy, oriented x3, able to pull himself up in bed, moving extremities equally Skin: warm and dry Extremities: no edema or cyanosis; 1+ DP pulses bilaterally, feet cooler to touch Psych: normal mood and affect, thought content normal  Labs: CBC    Component Value Date/Time   WBC 6.3 07/11/2024 0524   RBC 2.96 (L) 07/11/2024 0524   HGB 10.2 (L) 07/11/2024 0528   HGB 7.8 (L) 06/08/2024 1503  HGB 13.7 05/06/2017 0926   HCT 30.0 (L) 07/11/2024 0528   HCT 25.2 (L) 06/08/2024 1503   HCT 41.9 05/06/2017 0926   PLT 93 (L) 07/11/2024 0524   PLT 104 (L) 06/08/2024 1503   MCV 103.7 (H) 07/11/2024 0524   MCV 102 (H) 06/08/2024 1503   MCV 100.7 (H) 05/06/2017 0926   MCH 31.4 07/11/2024 0524   MCHC 30.3 07/11/2024 0524   RDW 16.0 (H) 07/11/2024 0524   RDW 14.2 06/08/2024 1503   RDW 14.5 05/06/2017 0926   LYMPHSABS 1.9 07/11/2024 0524   LYMPHSABS 1.1 06/08/2024 1503   LYMPHSABS 1.0 05/06/2017 0926   MONOABS 0.4 07/11/2024 0524   MONOABS 0.4 05/06/2017 0926   EOSABS 0.1 07/11/2024 0524   EOSABS 0.1 06/08/2024 1503   BASOSABS 0.0 07/11/2024 0524   BASOSABS 0.0 06/08/2024 1503   BASOSABS 0.0 05/06/2017 0926     CMP     Component Value Date/Time   NA 144 07/11/2024 0528   NA 144 06/15/2024 0953   NA 142 05/06/2017 0926   K 3.7 07/11/2024 0528   K  4.0 05/06/2017 0926   CL 108 07/11/2024 0527   CL 101 01/25/2011 0901   CO2 21 (L) 07/11/2024 0524   CO2 27 05/06/2017 0926   GLUCOSE 102 (H) 07/11/2024 0527   GLUCOSE 108 05/06/2017 0926   GLUCOSE 112 01/25/2011 0901   BUN 22 07/11/2024 0527   BUN 25 06/15/2024 0953   BUN 14.6 05/06/2017 0926   CREATININE 1.60 (H) 07/11/2024 0527   CREATININE 1.1 05/06/2017 0926   CALCIUM  8.7 (L) 07/11/2024 0524   CALCIUM  9.7 05/06/2017 0926   PROT 7.1 07/11/2024 0524   PROT 6.1 06/08/2024 1501   PROT 7.6 05/06/2017 0926   ALBUMIN  3.9 07/11/2024 0524   ALBUMIN  3.8 06/08/2024 1501   ALBUMIN  3.6 05/06/2017 0926   AST 74 (H) 07/11/2024 0524   AST 63 (H) 05/06/2017 0926   ALT 53 (H) 07/11/2024 0524   ALT 43 05/06/2017 0926   ALKPHOS 100 07/11/2024 0524   ALKPHOS 73 05/06/2017 0926   BILITOT 0.5 07/11/2024 0524   BILITOT 0.4 06/08/2024 1501   BILITOT 0.71 05/06/2017 0926   GFRNONAA 47 (L) 07/11/2024 0524   GFRNONAA >89 12/29/2014 0910   GFRAA >60 02/14/2020 1020   GFRAA >89 12/29/2014 0910    Imaging: CTA and CXR reads mentioned in ED course and A&P.  EKG: personally reviewed my interpretation is Atrial fibrillation with LBBB, prolonged QRS. Prior EKG showing atrial fibrillation and left anterior fascicular block.   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  #Acute [name] chronic hypoxic respiratory failure #COPD [name] 3L [place] at home #HFmrEF Patient presenting with acute-onset dyspnea, chest pain, and diaphoresis that awoke him from sleep.  Hypoxic with EMS [name] home 3 L nasal cannula to the 70s.   Briefly placed [name] BiPAP and HFNC which improved her oxygen saturation to normal.  Now returned to home 3 L nasal cannula with 100% saturation.  Initial VBG showed pH 7.29 with pCO2 of 49 and bicarb of roughly 24.  Afebrile, nontachycardic, no leukocytosis.  CXR showing bilateral interstitial opacities which remain increased from baseline, possibly due to vascular congestion versus less likely viral/atypical respiratory infection.  [name] initial exam, the patient had normal work of breathing [name] home 3L [place] with bibasilar crackles, no wheezes.  Patient does have history of HFrEF with elevated proBNP to 8000s which is actually improved from prior hospitalization where it was 14,000.  Crackles could be in the setting of pleural effusion seen [name] prior imaging.  Low concern for COPD exacerbation or acute infectious process.  No signs of hypervolemia, will withhold diuresis. Unclear cause of acute hypoxia, but there is concern for potential cardiopulmonary process given CT imaging findings discussed below.  Patient did state that he felt dizzy upon awakening, will check a set of orthostatic vitals and have PT/OT evaluate the patient for further needs.  Will admit patient for observation and continued monitoring for further episodes of chest pain. - Continuous cardiac monitoring - Orthostatic vitals - Continue home Entresto  - Continue home Toprol -XL - Brovana  Nebulizer 15 mcg bid + Incruse Ellipta  while admitted (holding home Stiolto Respimat  as it is not [name] formulary) - Hold home spironolactone  + amlodipine  (will resume if pressures consistently elevated) - PT/OT eval  #Possible Aortic EndoLeak seen [name] CTA #Aortic Aneurysm s/p repair Initially reported chest pain, dyspnea, and diaphoresis which seems to have improved since initial onset of symptoms. CT imaging findings with concern for Aortic Endoleak vs aortic dissection, stable AAA s/p repair by Dr. Serene [name] 12/02/23. Takes ASA and Plavix  as home medications.  Discussed case with Dr. Pearline who reviewed patient's imaging. No concern for dissection or worsening AAA from vascular surgery perspective. He does have a follow up appointment [name] 07/26/24 which is appropriate at this time. No further recommendations from vascular surgery. - Continue home ASA + Plavix  - Continue home rosuvastatin   #Prior NSTEMI #Aortic atherosclerosis Initial complaints of chest pain, but none active at this time. Troponins initially elevated but low delta (70->76) and close to baseline values. EKG without concerning ST/T changes. Lower concern for ACS at this time but patient remains at high risk. Elevation in troponins could be in setting of CKD. Will continue to monitor patient overnight [name] telemetry. - Continuous cardiac monitoring - STAT EKG + troponins if new CP - Continue home ASA + Plavix  - Continue home rosuvastatin   #Persistent Afib not [name] AC Presented in atrial fibrillation with rates controlled.  CHA2DS2-VASc of 5 but not [name] anticoagulation d/t prior GI bleeding [name] Xarelto . Has seen EP in 2022 who recommended rate control. Takes Toprol -XL 25 mg daily, will continue home medications while admitted and place [name] telemetry.  - Continuous cardiac monitoring - Continue home Toprol -XL 25 mg daily  #Elevated AST/ALT Hepatic pattern of elevation. AST/ALT of 74/53. Normal ALP. No acute abdominal symptoms/signs. Will trend values with CMP.  - Trend CMPs  #Concern for Malnutrition #Cachexia Patient remains chronically ill-appearing with cachexia (BMI 17.9). Likely in the setting of known prostate adenocarcinoma + emphysema/COPD. Albumin  [name] admission of 3.9. - Ensure supplements bid b/w meals - Continue home mirtazapine  15 mg nightly  #CKD3 Appears to be at baseline Cr (~1.5-1.6). Received contrast as part of workup for possible dissection/acute aortic pathologies. Will monitor Cr for AKI and avoid further nephrotoxic agents. - Continue to monitor  #HTN BP ranging  120s-140s SBP. Home medications include amlodipine  10 mg, Toprol -XL 25 mg, Entresto  97-103 mg, and spironolactone  12.5 mg. Caution with restarting home meds while pressures [name] the lower  side. Will start with Entresto  and Toprol -XL and add back as tolerates. - Continue home Entresto  - Continue home Toprol -XL - Hold home amlodipine  and spironolactone  - Continue to monitor  #TUD Currently smoking 10 cigarettes/day. Will offer nicotine  patch while admitted. - Nicotine  patch 14 mg daily  Chronic Stable Medical Conditions  #GERD - continue home Protonix  #HLD - continue home rosuvastatin  #Depression - continue home escitalopram    Best practice: Diet: Heart Healthy VTE: Enoxaparin  IVF: None,None Code: Full  Disposition planning: Prior to Admission Living Arrangement: Home Anticipated Discharge Location: Home  Dispo: Admit patient to Observation with expected length of stay less than 2 midnights.  Signed: Georgann Bramble, MD [facility]  PGY-1 07/11/2024, 10:47 AM  Please contact IM Residency [name]-Call Pager at: [phone] or [phone].  "

## 2024-07-11 NOTE — ED Notes (Signed)
 Patient was complaining of skin break down on his butt. I checked and did notice a little break down starting to appear so I dressed it with petroleum gauze, regular guaze and foam patch. Patient reports it feeling better after dressed.

## 2024-07-11 NOTE — ED Triage Notes (Signed)
 Patient from home BIB [facility] w/ coomplaints of chest pain and shortness of breath. Chest pain located in the middle EMS advised patient was diaphoretic upon arrival. His SpO2 was 70% on 3 L of O2 which is his baseline. EMS placed patient on an NRB at 15 L and Spo2 improved to 94 %. Patient given 324 mg of ASA pta.

## 2024-07-11 NOTE — ED Provider Notes (Signed)
 " [place] EMERGENCY DEPARTMENT AT [facility] Provider Note   CSN: [id number] Arrival date & time: 07/11/24  9483     Patient presents with: Shortness of Breath   [name] is a 79 y.o. male.   The history is provided by the patient and medical records.  Shortness of Breath [name] is a 79 y.o. male who presents to the Emergency Department complaining of sob.  He presents to the ED by EMS for evaluation of sob that started suddenly.  He reported chest pain to EMS, no chest pain at time of ED arrival.  EMS reports sats in the 70s on their arrival on his home oxygen of 3 L.  They also report that he was diaphoretic.  He was treated with 324 mg of aspirin  and placed on nonrebreather.  Denies any fevers, abdominal pain, nausea, vomiting, diarrhea.  He does state that he feels like he is going to die.     Prior to Admission medications  Medication Sig Start Date End Date Taking? Authorizing Provider  acetaminophen  (TYLENOL ) 500 MG tablet Take 500 mg by mouth every 6 (six) hours as needed for moderate pain (pain score 4-6).    [provider]  albuterol  (VENTOLIN  HFA) 108 (90 Base) MCG/ACT inhaler Inhale 1-2 puffs into the lungs every 6 (six) hours as needed for wheezing or shortness of breath. 12/23/23   [name], DO  amLODipine  (NORVASC ) 10 MG tablet Take 1 tablet (10 mg total) by mouth daily. 12/09/23 12/08/24  [name], DO  aspirin  EC 81 MG tablet Take 1 tablet (81 mg total) by mouth daily. Swallow whole. 04/14/23   [name] ORN, MD  clopidogrel  (PLAVIX ) 75 MG tablet Take 1 tablet (75 mg total) by mouth daily. 12/11/23   Bethanie Cough, PA-C  diphenhydrAMINE  (BENADRYL ) 50 MG capsule Take one capsule 1 hour prior to scan. 06/23/24   [name] ORN, MD  escitalopram  (LEXAPRO ) 5 MG tablet Take 1 tablet (5 mg total) by mouth daily. 07/08/24 01/04/25  [name], DO  furosemide  (LASIX ) 40 MG tablet Take 1 tablet (40 mg total) by mouth daily.  05/29/24   Harrie Bruckner, DO  HYDROcodone -acetaminophen  (NORCO) 7.5-325 MG tablet Take 1 tablet by mouth 2 (two) times daily as needed for moderate pain (pain score 4-6). Fill 30 days after last fill. 06/24/24   [name], DO  metoprolol  succinate (TOPROL -XL) 25 MG 24 hr tablet Take 1 tablet (25 mg total) by mouth daily. 06/24/24 06/24/25  [name], DO  mirtazapine  (REMERON ) 7.5 MG tablet Take 2 tablets (15 mg total) by mouth at bedtime. 03/24/24   [name], DO  OXYGEN Inhale 3 L/min into the lungs continuous.    [provider]  Pancrelipase , Lip-Prot-Amyl, (ZENPEP ) 20000-63000 units CPEP Take 3 capsules by mouth 3 (three) times daily before meals. 07/05/24   [name], DO  pantoprazole  (PROTONIX ) 40 MG tablet TAKE 1 TABLET EVERY DAY FOR REFLUX 12/09/23   [name], DO  predniSONE  (DELTASONE ) 50 MG tablet Take one tablet 13 hours, 7 hours, and 1 hour prior to scan. 06/23/24   [name] ORN, MD  rosuvastatin  (CRESTOR ) 40 MG tablet Take 1 tablet (40 mg total) by mouth daily. 04/03/23   Jerrell Cleatus Ned, MD  sacubitril -valsartan  (ENTRESTO ) 97-103 MG Take 1 tablet by mouth 2 (two) times daily. 04/19/24   [name], DO  senna (SENOKOT) 8.6 MG TABS tablet Take 1  tablet (8.6 mg total) by mouth in the morning and at bedtime. 03/27/24   Smucker, Melvenia, MD  spironolactone  (ALDACTONE ) 25 MG tablet Take 0.5 tablets (12.5 mg total) by mouth daily. 06/07/24 09/05/24  [name], Callie E, PA-C  Tiotropium Bromide-Olodaterol (STIOLTO RESPIMAT ) 2.5-2.5 MCG/ACT AERS Inhale 2 puffs into the lungs daily. 02/27/24   Hattar, Zola SAILOR, MD    Allergies: Candesartan cilexetil-hctz, Hydrochlorothiazide, Shellfish allergy, and Betadine [povidone iodine ]    Review of Systems  Respiratory:  Positive for shortness of breath.   All other systems reviewed and are negative.   Updated Vital Signs BP (!) 144/91   Pulse 74   Temp (!) 97.3 F (36.3 C) (Oral)   Resp 19   Ht  6' (1.829 m)   Wt 60 kg   SpO2 100%   BMI 17.94 kg/m   Physical Exam Vitals and nursing note reviewed.  Constitutional:      General: He is in acute distress.     Appearance: He is well-developed. He is ill-appearing.     Comments: Drowsy but conversant  HENT:     Head: Normocephalic and atraumatic.  Cardiovascular:     Rate and Rhythm: Normal rate and regular rhythm.     Heart sounds: No murmur heard. Pulmonary:     Effort: Pulmonary effort is normal. No respiratory distress.     Comments: Decreased air movement bilaterally Abdominal:     Palpations: Abdomen is soft.     Tenderness: There is no abdominal tenderness. There is no guarding or rebound.  Musculoskeletal:        General: No tenderness.  Skin:    General: Skin is warm and dry.  Neurological:     Mental Status: He is oriented to person, place, and time.  Psychiatric:        Behavior: Behavior normal.     (all labs ordered are listed, but only abnormal results are displayed) Labs Reviewed  COMPREHENSIVE METABOLIC PANEL WITH GFR - Abnormal; Notable for the following components:      Result Value   CO2 21 (*)    Glucose, Bld 103 (*)    Creatinine, Ser 1.50 (*)    Calcium  8.7 (*)    AST 74 (*)    ALT 53 (*)    GFR, Estimated 47 (*)    All other components within normal limits  CBC WITH DIFFERENTIAL/PLATELET - Abnormal; Notable for the following components:   RBC 2.96 (*)    Hemoglobin 9.3 (*)    HCT 30.7 (*)    MCV 103.7 (*)    RDW 16.0 (*)    Platelets 93 (*)    All other components within normal limits  PRO BRAIN NATRIURETIC PEPTIDE - Abnormal; Notable for the following components:   Pro Brain Natriuretic Peptide 8,228.0 (*)    All other components within normal limits  I-STAT CHEM 8, ED - Abnormal; Notable for the following components:   Creatinine, Ser 1.60 (*)    Glucose, Bld 102 (*)    Hemoglobin 10.9 (*)    HCT 32.0 (*)    All other components within normal limits  I-STAT VENOUS BLOOD GAS,  ED - Abnormal; Notable for the following components:   Acid-base deficit 3.0 (*)    HCT 30.0 (*)    Hemoglobin 10.2 (*)    All other components within normal limits  TROPONIN T, HIGH SENSITIVITY - Abnormal; Notable for the following components:   Troponin T High Sensitivity 70 (*)  All other components within normal limits    EKG: EKG Interpretation Date/Time:  Sunday July 11 2024 05:19:08 EST Ventricular Rate:  65 PR Interval:  133 QRS Duration:  135 QT Interval:  406 QTC Calculation: 423 R Axis:   -59  Text Interpretation: Atrial fibrillation Left bundle branch block Confirmed by Griselda Mcdowell [phone]) on 07/11/2024 5:44:40 AM  Radiology: [facility] Chest Port 1 View Result Date: 07/11/2024 EXAM: 1 VIEW(S) XRAY OF THE CHEST 07/11/2024 05:31:21 AM COMPARISON: 05/26/2024 CLINICAL HISTORY: 79 year old male. Shortness of breath, emphysema, suspected pulmonary artery hypertension, aortic aneurysm and endograft. FINDINGS: LUNGS AND PLEURA: Elevated right hemidiaphragm. Symmetric increased pulmonary interstitial opacity is chronic but appears progressed since November. Chronic right lung volume loss and architectural distortion are unchanged. No pneumothorax. No confluent lung opacity. HEART AND MEDIASTINUM: Prominent thoracic aorta with possible aneurysmal dilatation. No acute abnormality of the cardiac silhouette. BONES AND SOFT TISSUES: No acute osseous abnormality. Partially visible chronic abdominal aortic endograft. IMPRESSION: 1. Chronic Lung disease with emphysema. Bilateral interstitial opacities remain increased from baseline.consider vascular congestion / edema and less likely viral / atypical respiratory infection. Electronically signed by: Helayne Hurst MD 07/11/2024 05:39 AM EST RP Workstation: [id number]     Procedures  CRITICAL CARE Performed by: Hildenbrand Griselda   Total critical care time: 40 minutes  Critical care time was exclusive of separately billable procedures and  treating other patients.  Critical care was necessary to treat or prevent imminent or life-threatening deterioration.  Critical care was time spent personally by me on the following activities: development of treatment plan with patient and/or surrogate as well as nursing, discussions with consultants, evaluation of patient's response to treatment, examination of patient, obtaining history from patient or surrogate, ordering and performing treatments and interventions, ordering and review of laboratory studies, ordering and review of radiographic studies, pulse oximetry and re-evaluation of patient's condition.  Medications Ordered in the ED  methylPREDNISolone  sodium succinate  (SOLU-MEDROL ) 125 mg/2 mL injection 125 mg (125 mg Intravenous Given 07/11/24 0531)  iohexol  (OMNIPAQUE ) 350 MG/ML injection 75 mL (75 mLs Intravenous Contrast Given 07/11/24 0640)                                    Medical Decision Making Amount and/or Complexity of Data Reviewed Labs: ordered. Radiology: ordered.  Risk Prescription drug management. Decision regarding hospitalization.   Patient with history of COPD, AAA status post repair, CKD, A-fib not on anticoagulation here for evaluation of shortness of breath.  He did report chest pain to EMS but denies chest pain at time of ED assessment.  EKG with rate controlled A-fib, bundle branch block that is similar when compared to priors.  On examination at assessment he was lethargic with decreased air movement bilaterally.  Initial concern for significant CO2 retention.  He was placed on BiPAP for respiratory support.  His mental status did improve with BiPAP but VBG is not consistent with CO2 narcosis.  Chest x-ray is negative for pneumothorax-images personally reviewed and interpreted, agree with radiology interpretation.  Pending ongoing workup he was treated with Solu-Medrol , continuous nebs through BiPAP circuit.  He was removed from BiPAP for CTA.  On return from  scan he had recurrent somnolence but no significant change in lung exam.  He was placed back on BiPAP with significant improvement.  Patient care transferred pending CTA, reevaluation.     Final diagnoses:  None    ED Discharge  Orders     None          Griselda Vossler, MD 07/11/24 2147  "

## 2024-07-11 NOTE — ED Provider Notes (Signed)
 Care assumed from Dr. Griselda.  At time of transfer of care, patient is awaiting consultation and discussion with vascular surgery given the CT imaging showing evidence of a new endoleak from the aortic repair previously.  Patient reportedly woke up at 3 AM with severe chest pain and shortness of breath and diaphoresis and hypoxia.  Patient will need admission after discussion with vascular.  8:54 AM Spoke to vascular who feels that this abnormality on imaging was likely from the initial surgery and not an acute abnormality or change that needs intervention at this time.  They agree with medicine admission given the new oxygen requirement.  They say he does not need a procedure at this time and can be admitted by medicine.  Patient is off of BiPAP but still on oxygen for the hypoxia, will admit to medicine for further management.  Clinical Impression: 1. Hypoxia   2. Shortness of breath   3. Chest pain, unspecified type     Disposition: Admit  This note was prepared with assistance of Dragon voice recognition software. Occasional wrong-word or sound-a-like substitutions may have occurred due to the inherent limitations of voice recognition software.     Rogina Schiano, Lonni PARAS, MD 07/11/24 [phone]

## 2024-07-11 NOTE — ED Notes (Signed)
 Pt to and from CT with RN, pt placed on NRB for transport, pt appears more sedate on return to room, MD and RT at bedside, pt placed back on Bipap.

## 2024-07-11 NOTE — Discharge Instructions (Addendum)
 To [name] Her or their caretakers,  You were recently admitted to [facility] for .   Continue taking your home medications with the following changes:  Start taking  Stop taking    You should seek further medical care if you experience more dark or bright red blood with stools. Also seek care if you have difficulty breathing on your home oxygen, chest pain, or increased swelling to your legs.   Follow-up Information     Avram Lupita BRAVO, MD. Go on 08/31/2024.   Specialty: Gastroenterology Why: 2:50 PM Contact information: [address] [place] [address] [phone]         [facility] [place]) Follow up.   Specialty: Home Health Services Why: [facility] for catheter change Contact information: [address] [place]  [phone]                We recommend that you also see your primary care doctor in about a week to make sure that you continue to improve. We are so glad that you are feeling better.  Sincerely,  [name]

## 2024-07-11 NOTE — Progress Notes (Signed)
 Pt weaned to 4 lpm [place] per MD. Tolerating well at this time.

## 2024-07-12 ENCOUNTER — Encounter (HOSPITAL_COMMUNITY): Payer: Self-pay | Admitting: Internal Medicine

## 2024-07-12 ENCOUNTER — Ambulatory Visit (HOSPITAL_COMMUNITY)

## 2024-07-12 ENCOUNTER — Observation Stay (HOSPITAL_COMMUNITY)

## 2024-07-12 DIAGNOSIS — E785 Hyperlipidemia, unspecified: Secondary | ICD-10-CM | POA: Diagnosis not present

## 2024-07-12 DIAGNOSIS — D696 Thrombocytopenia, unspecified: Secondary | ICD-10-CM

## 2024-07-12 DIAGNOSIS — N183 Chronic kidney disease, stage 3 unspecified: Secondary | ICD-10-CM | POA: Diagnosis not present

## 2024-07-12 DIAGNOSIS — I4819 Other persistent atrial fibrillation: Secondary | ICD-10-CM | POA: Diagnosis not present

## 2024-07-12 DIAGNOSIS — F32A Depression, unspecified: Secondary | ICD-10-CM | POA: Diagnosis not present

## 2024-07-12 DIAGNOSIS — I719 Aortic aneurysm of unspecified site, without rupture: Secondary | ICD-10-CM | POA: Diagnosis not present

## 2024-07-12 DIAGNOSIS — I130 Hypertensive heart and chronic kidney disease with heart failure and stage 1 through stage 4 chronic kidney disease, or unspecified chronic kidney disease: Secondary | ICD-10-CM | POA: Diagnosis not present

## 2024-07-12 DIAGNOSIS — J9621 Acute and chronic respiratory failure with hypoxia: Secondary | ICD-10-CM | POA: Diagnosis not present

## 2024-07-12 DIAGNOSIS — I700 Atherosclerosis of aorta: Secondary | ICD-10-CM | POA: Diagnosis not present

## 2024-07-12 DIAGNOSIS — J449 Chronic obstructive pulmonary disease, unspecified: Secondary | ICD-10-CM | POA: Diagnosis not present

## 2024-07-12 DIAGNOSIS — I5020 Unspecified systolic (congestive) heart failure: Secondary | ICD-10-CM | POA: Diagnosis not present

## 2024-07-12 DIAGNOSIS — K219 Gastro-esophageal reflux disease without esophagitis: Secondary | ICD-10-CM | POA: Diagnosis not present

## 2024-07-12 LAB — DIFFERENTIAL
Abs Immature Granulocytes: 0.01 10*3/uL (ref 0.00–0.07)
Basophils Absolute: 0 10*3/uL (ref 0.0–0.1)
Basophils Relative: 0 %
Eosinophils Absolute: 0 10*3/uL (ref 0.0–0.5)
Eosinophils Relative: 0 %
Immature Granulocytes: 0 %
Lymphocytes Relative: 13 %
Lymphs Abs: 0.8 10*3/uL (ref 0.7–4.0)
Monocytes Absolute: 0.3 10*3/uL (ref 0.1–1.0)
Monocytes Relative: 5 %
Neutro Abs: 5.2 10*3/uL (ref 1.7–7.7)
Neutrophils Relative %: 82 %

## 2024-07-12 LAB — CBC
HCT: 22.6 % — ABNORMAL LOW (ref 39.0–52.0)
HCT: 25.3 % — ABNORMAL LOW (ref 39.0–52.0)
Hemoglobin: 6.8 g/dL — CL (ref 13.0–17.0)
Hemoglobin: 7.8 g/dL — ABNORMAL LOW (ref 13.0–17.0)
MCH: 30.9 pg (ref 26.0–34.0)
MCH: 31.6 pg (ref 26.0–34.0)
MCHC: 30.1 g/dL (ref 30.0–36.0)
MCHC: 30.8 g/dL (ref 30.0–36.0)
MCV: 102.7 fL — ABNORMAL HIGH (ref 80.0–100.0)
MCV: 102.4 fL — ABNORMAL HIGH (ref 80.0–100.0)
Platelets: 80 10*3/uL — ABNORMAL LOW (ref 150–400)
Platelets: 104 10*3/uL — ABNORMAL LOW (ref 150–400)
RBC: 2.2 MIL/uL — ABNORMAL LOW (ref 4.22–5.81)
RBC: 2.47 MIL/uL — ABNORMAL LOW (ref 4.22–5.81)
RDW: 15.9 % — ABNORMAL HIGH (ref 11.5–15.5)
RDW: 15.9 % — ABNORMAL HIGH (ref 11.5–15.5)
WBC: 5.6 10*3/uL (ref 4.0–10.5)
WBC: 6.4 10*3/uL (ref 4.0–10.5)
nRBC: 0 % (ref 0.0–0.2)
nRBC: 0 % (ref 0.0–0.2)

## 2024-07-12 LAB — DIRECT ANTIGLOBULIN TEST (NOT AT ARMC)
DAT, IgG: NEGATIVE
DAT, complement: NEGATIVE

## 2024-07-12 LAB — PATHOLOGIST SMEAR REVIEW

## 2024-07-12 LAB — RETICULOCYTES
Immature Retic Fract: 25.4 % — ABNORMAL HIGH (ref 2.3–15.9)
RBC.: 2.56 MIL/uL — ABNORMAL LOW (ref 4.22–5.81)
Retic Count, Absolute: 58.4 10*3/uL (ref 19.0–186.0)
Retic Ct Pct: 2.3 % (ref 0.4–3.1)

## 2024-07-12 LAB — CBC WITH DIFFERENTIAL/PLATELET
Abs Immature Granulocytes: 0.07 10*3/uL (ref 0.00–0.07)
Basophils Absolute: 0 10*3/uL (ref 0.0–0.1)
Basophils Relative: 0 %
Eosinophils Absolute: 0 10*3/uL (ref 0.0–0.5)
Eosinophils Relative: 0 %
HCT: 25.7 % — ABNORMAL LOW (ref 39.0–52.0)
Hemoglobin: 8 g/dL — ABNORMAL LOW (ref 13.0–17.0)
Immature Granulocytes: 1 %
Lymphocytes Relative: 14 %
Lymphs Abs: 1.1 10*3/uL (ref 0.7–4.0)
MCH: 31.4 pg (ref 26.0–34.0)
MCHC: 31.1 g/dL (ref 30.0–36.0)
MCV: 100.8 fL — ABNORMAL HIGH (ref 80.0–100.0)
Monocytes Absolute: 0.5 10*3/uL (ref 0.1–1.0)
Monocytes Relative: 7 %
Neutro Abs: 6.1 10*3/uL (ref 1.7–7.7)
Neutrophils Relative %: 78 %
Platelets: 100 10*3/uL — ABNORMAL LOW (ref 150–400)
RBC: 2.55 MIL/uL — ABNORMAL LOW (ref 4.22–5.81)
RDW: 15.9 % — ABNORMAL HIGH (ref 11.5–15.5)
WBC: 7.8 10*3/uL (ref 4.0–10.5)
nRBC: 0 % (ref 0.0–0.2)

## 2024-07-12 LAB — COMPREHENSIVE METABOLIC PANEL WITH GFR
ALT: 32 U/L (ref 0–44)
AST: 33 U/L (ref 15–41)
Albumin: 3.2 g/dL — ABNORMAL LOW (ref 3.5–5.0)
Alkaline Phosphatase: 65 U/L (ref 38–126)
Anion gap: 9 (ref 5–15)
BUN: 29 mg/dL — ABNORMAL HIGH (ref 8–23)
CO2: 22 mmol/L (ref 22–32)
Calcium: 8.6 mg/dL — ABNORMAL LOW (ref 8.9–10.3)
Chloride: 109 mmol/L (ref 98–111)
Creatinine, Ser: 1.58 mg/dL — ABNORMAL HIGH (ref 0.61–1.24)
GFR, Estimated: 44 mL/min — ABNORMAL LOW
Glucose, Bld: 102 mg/dL — ABNORMAL HIGH (ref 70–99)
Potassium: 4.6 mmol/L (ref 3.5–5.1)
Sodium: 140 mmol/L (ref 135–145)
Total Bilirubin: 0.4 mg/dL (ref 0.0–1.2)
Total Protein: 5.6 g/dL — ABNORMAL LOW (ref 6.5–8.1)

## 2024-07-12 LAB — URINALYSIS, ROUTINE W REFLEX MICROSCOPIC
Bilirubin Urine: NEGATIVE
Glucose, UA: NEGATIVE mg/dL
Hgb urine dipstick: NEGATIVE
Ketones, ur: NEGATIVE mg/dL
Nitrite: NEGATIVE
Protein, ur: 100 mg/dL — AB
Specific Gravity, Urine: 1.028 (ref 1.005–1.030)
WBC, UA: >50 WBC/hpf (ref 0–5)
pH: 6 (ref 5.0–8.0)

## 2024-07-12 LAB — PREPARE RBC (CROSSMATCH)

## 2024-07-12 LAB — LACTATE DEHYDROGENASE: LDH: 176 U/L (ref 105–235)

## 2024-07-12 LAB — IRON AND TIBC
Iron: 49 ug/dL (ref 45–182)
Saturation Ratios: 17 % — ABNORMAL LOW (ref 17.9–39.5)
TIBC: 294 ug/dL (ref 250–450)
UIBC: 245 ug/dL

## 2024-07-12 LAB — FOLATE: Folate: >20 ng/mL

## 2024-07-12 LAB — TECHNOLOGIST SMEAR REVIEW: Plt Morphology: NORMAL

## 2024-07-12 LAB — VITAMIN B12: Vitamin B-12: 533 pg/mL (ref 180–914)

## 2024-07-12 LAB — FERRITIN: Ferritin: 155 ng/mL (ref 24–336)

## 2024-07-12 MED ORDER — HYDROCODONE-ACETAMINOPHEN 7.5-325 MG PO TABS
1.0000 | ORAL_TABLET | Freq: Two times a day (BID) | ORAL | Status: DC | PRN
  Administered 2024-07-12 – 2024-07-21 (×12): 1 via ORAL
  Filled 2024-07-12 (×13): qty 1

## 2024-07-12 MED ORDER — METOPROLOL SUCCINATE ER 25 MG PO TB24: 12.5000 mg | ORAL_TABLET | Freq: Every day | ORAL | Status: DC

## 2024-07-12 MED ORDER — SODIUM CHLORIDE 0.9% IV SOLUTION: Freq: Once | INTRAVENOUS | Status: AC

## 2024-07-12 NOTE — Evaluation (Signed)
 Physical Therapy Evaluation Patient Details Name: [name] MRN: [id number] DOB: 10/13/1945 Today's Date: 07/12/2024  History of Present Illness  Patient is a 79 yo male presenting to the ED with chest pain and SOB on 07/11/24. Admitted with acute respiratory failure. Placed on 15L NRB. 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 Expect pt will make good progress back to baseline with mobility. Will follow acutely but doubt pt will need PT after DC.          If plan is discharge home, recommend the following:     Can travel by private vehicle        Equipment Recommendations None recommended by PT  Recommendations for Other Services       Functional Status Assessment Patient has had a recent decline in their functional status and demonstrates the ability to make significant improvements in function in a reasonable and predictable amount of time.     Precautions / Restrictions Precautions Precautions: Fall;Other (comment) Recall of Precautions/Restrictions: Intact Precaution/Restrictions Comments: watch O2 Restrictions Weight Bearing Restrictions Per Provider Order: No      Mobility  Bed Mobility Overal bed mobility: Modified Independent                  Transfers Overall transfer level: Modified independent Equipment used: None                    Ambulation/Gait Ambulation/Gait assistance: Supervision Gait Distance (Feet): 250 Feet Assistive device: Rollator (4 wheels) Gait Pattern/deviations: Step-through pattern, Decreased stride length Gait velocity: decr Gait velocity interpretation: 1.31 - 2.62 ft/sec, indicative of limited community ambulator   General Gait Details: supervision for safety  Stairs            Wheelchair  Mobility     Tilt Bed    Modified Rankin (Stroke Patients Only)       Balance Overall balance assessment: Needs assistance Sitting-balance support: No upper extremity supported Sitting balance-Leahy Scale: Normal     Standing balance support: No upper extremity supported, During functional activity Standing balance-Leahy Scale: Good                               Pertinent Vitals/Pain      Home Living Family/patient expects to be discharged to:: Private residence Living Arrangements: Alone Available Help at Discharge: Family;Available PRN/intermittently Type of Home: Apartment Home Access: Stairs to enter Entrance Stairs-Rails: Right Entrance Stairs-Number of Steps: 2   Home Layout: One level Home Equipment: Agricultural Consultant (2 wheels);Rollator (4 wheels);Cane - single point      Prior Function Prior Level of Function : Independent/Modified Independent             Mobility Comments: typically uses his cane, one fall in the last 3 years ADLs Comments: independent, does not drive, manages medications independently     Extremity/Trunk Assessment   Upper Extremity Assessment Upper Extremity Assessment: Defer to OT evaluation    Lower Extremity Assessment Lower Extremity Assessment: Generalized weakness    Cervical / Trunk Assessment Cervical / Trunk Assessment: Kyphotic (minimally)  Communication   Communication Communication: No apparent difficulties    Cognition Arousal: Alert Behavior During Therapy: WFL for tasks assessed/performed   PT -  Cognitive impairments: No apparent impairments                         Following commands: Intact       Cueing Cueing Techniques: Verbal cues     General Comments General comments (skin integrity, edema, etc.): VSS on 3L O2.    Exercises     Assessment/Plan    PT Assessment Patient needs continued PT services  PT Problem List Decreased strength;Decreased balance;Decreased  mobility       PT Treatment Interventions DME instruction;Gait training;Stair training;Functional mobility training;Therapeutic activities;Therapeutic exercise;Balance training;Patient/family education    PT Goals (Current goals can be found in the Care Plan section)  Acute Rehab PT Goals Patient Stated Goal: return home PT Goal Formulation: With patient Time For Goal Achievement: 07/26/24 Potential to Achieve Goals: Good    Frequency Min 1X/week     Co-evaluation               AM-PAC PT 6 Clicks Mobility  Outcome Measure Help needed turning from your back to your side while in a flat bed without using bedrails?: None Help needed moving from lying on your back to sitting on the side of a flat bed without using bedrails?: None Help needed moving to and from a bed to a chair (including a wheelchair)?: None Help needed standing up from a chair using your arms (e.g., wheelchair or bedside chair)?: None Help needed to walk in hospital room?: A Little Help needed climbing 3-5 steps with a railing? : A Little 6 Click Score: 22    End of Session Equipment Utilized During Treatment: Oxygen Activity Tolerance: Patient tolerated treatment well Patient left: in bed;with call bell/phone within reach   PT Visit Diagnosis: Other abnormalities of gait and mobility (R26.89);Muscle weakness (generalized) (M62.81)    Time: 8580-8562 PT Time Calculation (min) (ACUTE ONLY): 18 min   Charges:   PT Evaluation $PT Eval Low Complexity: 1 Low   PT General Charges $$ ACUTE PT VISIT: 1 Visit         [facility] PT Acute Rehabilitation Services Office [phone]   Rodgers ORN [facility] 07/12/2024, 3:02 PM

## 2024-07-12 NOTE — ED Notes (Signed)
 OT at bedside.

## 2024-07-12 NOTE — Progress Notes (Signed)
 "  HD#0 SUBJECTIVE:  Patient Summary: [name] is a 79 y.o. 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   Overnight Events: Hgb dropped from 10.2 to 6.8 with morning labs. Night RN did not notice any overt bleeding. Earlier in the night the patient had been checked for sacral wounds.  Interim History: Patient feels great, about the same as he did upon IMTS evaluation yesterday.  Denies any chest pain or shortness of breath.  No dizziness, lightheadedness, weakness.  The patient has not noticed any blood anywhere.  Has yet to have a bowel movement today, did have 1 yesterday prior but denies any sort of bright red blood or dark/black stools.  No other acute concerns per the patient.  OBJECTIVE:  Vital Signs: Vitals:   07/12/24 0800 07/12/24 0908 07/12/24 1115 07/12/24 1130  BP: 135/63  (!) 106/50 108/62  Pulse: 68   67  Resp: 18  19 13   Temp:  98 F (36.7 C)    TempSrc:  Oral    SpO2: 100%   100%  Weight:      Height:        Filed Weights   07/11/24 0522  Weight: 60 kg    No intake or output data in the 24 hours ending 07/12/24 1205 Net IO Since Admission: No IO data has been entered for this period [07/12/24 1205]  Physical Exam: Constitutional: chronically ill-appearing, cachectic elderly man in no acute distress HENT: normocephalic atraumatic, mucous membranes moist Eyes: conjunctiva non-erythematous, PERRL, no scleral icterus Cardiovascular: irregularly irregular rhythm with normal rate, no m/r/g Pulmonary/Chest: barrel-shaped chest; normal work of breathing on home 3L [place], diminished breath sounds bilaterally; no wheezes/rales/rhonchi Abdominal: soft, non-tender, non-distended, bowel sounds normal MSK: normal bulk and  tone Neurological: alert and oriented x3, able to pull himself up in bed, moving extremities equally Skin: warm and dry GU: Dark yellow urine in urinal without blood visualized; no suprapubic tenderness Extremities: no edema or cyanosis; 1+ DP pulses bilaterally, feet cooler to touch Psych: normal mood and affect, thought content normal  Patient Lines/Drains/Airways Status     Active Line/Drains/Airways     Name Placement date Placement time Site Days   Peripheral IV 07/11/24 20 G Left Antecubital 07/11/24  0524  Antecubital  1   Suprapubic Catheter Latex 18 Fr. 07/08/22  1058  Latex  735   Wound 05/26/24 1815 Pressure Injury Penis Posterior Stage 3 -  Full thickness tissue loss. Subcutaneous fat may be visible but bone, tendon or muscle are NOT exposed. 05/26/24  1815  Penis  47            Pertinent labs and imaging:     Latest Ref Rng & Units 07/12/2024    9:06 AM 07/12/2024    6:13 AM 07/11/2024    5:28 AM  CBC  WBC 4.0 - 10.5 K/uL 6.4  5.6    Hemoglobin 13.0 - 17.0 g/dL 7.8  6.8  89.7   Hematocrit 39.0 - 52.0 % 25.3  22.6  30.0   Platelets 150 - 400 K/uL 104  80         Latest Ref Rng & Units 07/12/2024    6:13 AM 07/11/2024    5:28 AM 07/11/2024    5:27  AM  CMP  Glucose 70 - 99 mg/dL 897   897   BUN 8 - 23 mg/dL 29   22   Creatinine 9.38 - 1.24 mg/dL 8.41   8.39   Sodium 864 - 145 mmol/L 140  144  144   Potassium 3.5 - 5.1 mmol/L 4.6  3.7  3.7   Chloride 98 - 111 mmol/L 109   108   CO2 22 - 32 mmol/L 22     Calcium  8.9 - 10.3 mg/dL 8.6     Total Protein 6.5 - 8.1 g/dL 5.6     Total Bilirubin 0.0 - 1.2 mg/dL 0.4     Alkaline Phos 38 - 126 U/L 65     AST 15 - 41 U/L 33     ALT 0 - 44 U/L 32       No results found.  ASSESSMENT/PLAN:  Assessment: Principal Problem:   Acute hypoxic respiratory failure (HCC) Active Problems:   Chest pain   Shortness of breath   [name] is a 79 y.o. male with a history of COPD w/ chronic hypoxic respiratory failure  on 3L home O2, AAA s/p repair 11/2023, Afib not on AC, HFimpEF, Prostate adenocarcinoma, HTN, CKD3, and TUD, who presented with chest pain and dyspnea and admitted for acute on chronic hypoxic respiratory failure and found to have acute drop in Hgb, now on hospital day 0.  Plan: #Suspected Acute Blood Loss Anemia with chronic macrocytic anemia #Thrombocytopenia Acute drop in hemoglobin from 10.2 to 6.8 with morning labs.  MCV of 102.  Prior values in chart with wide range in values from high 7s-10. Unclear baseline. Repeated CBC showed Hgb with increased to 7.8 so held blood transfusion.  Patient grossly asymptomatic and feels well.  Does have history of macrocytic anemia with iron deficiency, previously on outpatient iron supplementation. Also previously thrombocytopenic. Denies hemoptysis, hematuria, hematochezia - though patient has not had BM yet this admission. Smear review did show schistocytes which could point to hemolytic anemia though bilirubin is normal. Will await pathology review. Haptoglobin is pending. Wonder if shearing could be present after aortic aneurysm repair, though initial surgery was ~6 months ago. Discussed case with vascular surgery PA on-call [facility]) and whether this change in Hgb would change interpretation of possible aortic endoleak versus dissection seen on CTA on admission.  Vascular recommended working up for other source of bleed since patient is not having any symptoms and still remains otherwise hemodynamically stable.  Also recommends changing from DAPT to single antiplatelet therapy.  They do not think that there is need for repeat imaging. Will continue monitoring patient for other source of bleed and working up other anemia. Ordering Abdominal US  to look for retroperitoneal bleed and UA for hematuria. - Trend CBCs q8h - Continue ASA - Discontinue Lovenox  - Discontinue Plavix  - Haptoglobin pending - Abdominal US  pending - UA pending - Iron panel pending -  Reticulocytes pending - Vitamin B12, Folate epdning  #Acute on chronic hypoxic respiratory failure #COPD on 3L [place] at home #HFmrEF Patient no longer with dyspnea or chest pain that he initially presented with.  Back on home 3L [place] with normal saturation.  Exam showing normal work of breathing on his home oxygen.  Lungs with diminished breath sounds but no wheezes, rales, rhonchi.  Unclear cause of acute hypoxia, have not found definitive explanation but did have rapid improvement.  Has HFrEF but appears euvolemic.  Do not think he needs diuresis at this  time.  Doubt COPD exacerbation but will continue Brovana  and Incruse Ellipta  while admitted (takes Stiolto Respimat  at home which is not on formulary). - Hold Entresto , Spironolactone  d/t hypotension - Decreased dose of Toprol -XL d/t hypotension - Brovana  Nebulizer 15 mcg bid + Incruse Ellipta     #Possible Aortic EndoLeak seen on CTA #Aortic Aneurysm s/p repair Initially reported chest pain, dyspnea, and diaphoresis which seems to have improved since initial onset of symptoms. CT imaging findings with concern for Aortic Endoleak vs aortic dissection, stable AAA s/p repair by Dr. Serene on 12/02/23. Takes ASA and Plavix  as home medications. Discussed case with Dr. Pearline who reviewed patient's imaging. No concern for dissection or worsening AAA from vascular surgery perspective. He does have a follow up appointment on 07/26/24 which is appropriate at this time. No further recommendations from vascular surgery. - Continue home ASA - Discontinue Plavix  - Continue home rosuvastatin    #Prior NSTEMI #Aortic atherosclerosis Initial complaints of chest pain, but none active at this time. Troponins initially elevated but low delta (70->76) and close to baseline values. EKG without concerning ST/T changes. Lower concern for ACS at this time but patient remains at high risk. Elevation in troponins could be in setting of CKD. Will continue to monitoring patient  on telemetry. - Continuous cardiac monitoring - STAT EKG + troponins if new CP - Continue home ASA - Discontinue Plavix  - Continue home rosuvastatin    #Persistent Afib not on AC Presented in atrial fibrillation with rates controlled.  CHA2DS2-VASc of 5 but not on anticoagulation d/t prior GI bleeding on Xarelto . Now with acute drop in Hgb. Has seen EP in 2022 who recommended rate control. Takes Toprol -XL 25 mg daily, will continue home medications while admitted and place on telemetry.  - Continuous cardiac monitoring - Change Toprol -XL to 12.5 mg, may hold if bradycardic or hypotensive   #Concern for Malnutrition #Cachexia Patient remains chronically ill-appearing with cachexia (BMI 17.9). Likely in the setting of known prostate adenocarcinoma + emphysema/COPD. Albumin  on admission of 3.9. - Ensure supplements bid b/w meals - Continue home mirtazapine  15 mg nightly   #CKD3 Appears to be at baseline Cr (~1.5-1.6). Received contrast as part of workup for possible dissection/acute aortic pathologies. Will monitor Cr for AKI and avoid further nephrotoxic agents. - Continue to monitor   #HTN Labile BP ranging 100s-140s SBP. Home medications include amlodipine  10 mg, Toprol -XL 25 mg, Entresto  97-103 mg, and spironolactone  12.5 mg. Caution with restarting home meds while pressures on the lower side.  - Continue home Toprol -XL - Hold home Entresto  d/t hypotension, may restart at half dose if pressures tolerate - Hold home amlodipine  and spironolactone   #Elevated AST/ALT, resolved AST/ALT of 74/53 on admission, now both in normal range. Normal ALP. No acute abdominal symptoms/signs.   Chronic Stable Medical Conditions   #Chronic Hip Pain - continue home Norco #TUD - continue nicotine  patches while admitted #GERD - continue home Protonix  #HLD - continue home rosuvastatin  #Depression - continue home escitalopram   Best Practice: Diet: Heart-Healthy IVF: Fluids: None, Rate: None VTE:  Place and maintain sequential compression device Start: 07/12/24 1131 Code: Full  Disposition planning: Therapy Recs: Pending, DME: other pending Family Contact: Sister, to be notified. DISPO: Anticipated discharge to Home pending further anemia workup.  Signature:  Letha Cheadle, MD [facility]  PGY-1 07/12/2024, 12:05 PM  On Call pager [phone]  "

## 2024-07-12 NOTE — Consult Note (Addendum)
 " Hospital Consult    Reason for Consult:  Questionable endoleak  MRN #:  [id number]  History of Present Illness: This is a 79 y.o. male who is admitted with a COPD exacerbation.  He was brought in by EMS for dyspnea and some mild chest pain.  He is being admitted to medicine. CTA was obtained given his fenestrated aortic repair in June 2025.  This demonstrated sealed aneurysm although there is some element of flow around the proximal aspect of the graft.  There is sealed throughout to the visceral segment without any flow into the aneurysm.  Past Medical History:  Diagnosis Date   AAA (abdominal aortic aneurysm)    followed by vasc surgery   Alcohol abuse    Barretts esophagus    bx distal esophagus 2011 neg    Bladder outlet obstruction 04/02/2018   CAD in native artery    a. s/p stent to prox RCA and mid Cx.   Cataract    Cholelithiasis    Chronic osteomyelitis involving ankle and foot, right (HCC) 07/13/2018   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   2006 - 3 adenomas largest 12 mm  2008 - no polyps 2013-  2 mm cecal polyp, adenoma, routine repeat colonoscopy 02/2017 approx 05/14/2017 18 polyps max 12 mm ALL adenomas recall 2019 [name], MD, [facility]      Dieulafoy lesion of duodenum    Diverticulosis of colon    DJD (degenerative joint disease)    left hip   Full dentures    upper and lower   GERD (gastroesophageal reflux disease)    diet controlled    Gout    feet   Hiatal hernia    2 cm noted on upper endos 2011    Hx of adenomatous colonic polyps    Hyperlipidemia    Hypertension    Hypoxia 05/31/2024   Internal hemorrhoid    Lung cancer (HCC)    squamous cell (T2N0MX), 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 pt was on Plavix  x 3 years then transitioned to ASA, no problems since   Non-small cell carcinoma of right lung, stage 1 (HCC) 01/30/2016   Renal failure, acute    hx of 2/2 medication,  resolved   Tobacco abuse    smoker .5 ppd x 55 yrs   UTI (lower urinary tract infection)     Past Surgical History:  Procedure Laterality Date   ABDOMINAL AORTIC ENDOVASCULAR FENESTRATED STENT GRAFT N/A 08/16/2022   Procedure: ABDOMINAL AORTIC ENDOVASCULAR FENESTRATED STENT GRAFT;  Surgeon: [name] ORN, MD;  [facility]: [facility] OR;  Service: Vascular;  Laterality: N/A;   ABDOMINAL AORTIC ENDOVASCULAR FENESTRATED STENT GRAFT N/A 12/02/2023   Procedure: ABDOMINAL AORTIC ENDOVASCULAR FENESTRATED STENT GRAFT WITH ULTRASOUND GUIDED ACCESS;  Surgeon: [name] ORN, MD;  Location: [facility] INVASIVE CV LAB;  Service: Cardiovascular;  Laterality: N/A;   BIOPSY  11/12/2020   Procedure: BIOPSY;  Surgeon: Legrand Victory LITTIE DOUGLAS, MD;  Location: [facility] ENDOSCOPY;  Service: Gastroenterology;;   BIOPSY  07/25/2021   Procedure: BIOPSY;  Surgeon: Stacia Glendia BRAVO, MD;  Location: [facility] ENDOSCOPY;  Service: Gastroenterology;;   BIOPSY  12/24/2021   Procedure: BIOPSY;  Surgeon: Wilhelmenia Aloha Raddle., MD;  Location: [facility] ENDOSCOPY;  Service: Gastroenterology;;   BRONCHIAL BIOPSY  07/27/2021   Procedure: BRONCHIAL BIOPSIES;  Surgeon: Shelah Lamar RAMAN, MD;  Location: [facility] ENDOSCOPY;  Service: Cardiopulmonary;;   BRONCHIAL BRUSHINGS  07/27/2021   Procedure: BRONCHIAL BRUSHINGS;  Surgeon: Shelah Lamar RAMAN, MD;  Location: [facility] ENDOSCOPY;  Service: Cardiopulmonary;;   cataract surgery Bilateral 2018   removal of cataracts   COLONOSCOPY     hx polyps - Avram 02/2012   COLONOSCOPY WITH PROPOFOL  N/A 11/12/2020   Procedure: COLONOSCOPY WITH PROPOFOL ;  Surgeon: Legrand Victory LITTIE DOUGLAS, MD;  Location: [facility] ENDOSCOPY;  Service: Gastroenterology;  Laterality: N/A;   CORONARY ANGIOPLASTY WITH STENT PLACEMENT     ENDOVASCULAR STENT INSERTION Bilateral 08/16/2022   Procedure: ENDOVASCULAR STENT GRAFT INSERTION OF BILATERAL RENAL ARTERIES;  Surgeon: [name] ORN, MD;  [facility] OR;  Service: Vascular;  Laterality: Bilateral;   ESOPHAGOGASTRODUODENOSCOPY  (EGD) WITH PROPOFOL  N/A 11/12/2020   Procedure: ESOPHAGOGASTRODUODENOSCOPY (EGD) WITH PROPOFOL ;  Surgeon: Legrand Victory LITTIE DOUGLAS, MD;  Location: [facility] ENDOSCOPY;  Service: Gastroenterology;  Laterality: N/A;   ESOPHAGOGASTRODUODENOSCOPY (EGD) WITH PROPOFOL  N/A 03/06/2021   Procedure: ESOPHAGOGASTRODUODENOSCOPY (EGD) WITH PROPOFOL ;  Surgeon: Leigh Elspeth SQUIBB, MD;  Location: [facility] ENDOSCOPY;  Service: Gastroenterology;  Laterality: N/A;   ESOPHAGOGASTRODUODENOSCOPY (EGD) WITH PROPOFOL  N/A 07/25/2021   Procedure: ESOPHAGOGASTRODUODENOSCOPY (EGD) WITH PROPOFOL ;  Surgeon: Stacia Glendia BRAVO, MD;  Location: [facility] ENDOSCOPY;  Service: Gastroenterology;  Laterality: N/A;   ESOPHAGOGASTRODUODENOSCOPY (EGD) WITH PROPOFOL  N/A 12/24/2021   Procedure: ESOPHAGOGASTRODUODENOSCOPY (EGD) WITH PROPOFOL ;  Surgeon: Wilhelmenia Aloha Raddle., MD;  Location: [facility] ENDOSCOPY;  Service: Gastroenterology;  Laterality: N/A;   flexible cystourethroscopy  05/24/06   HEMOSTASIS CLIP PLACEMENT  03/06/2021   Procedure: HEMOSTASIS CLIP PLACEMENT;  Surgeon: Leigh Elspeth SQUIBB, MD;  Location: [facility] ENDOSCOPY;  Service: Gastroenterology;;   HEMOSTASIS CLIP PLACEMENT  12/24/2021   Procedure: HEMOSTASIS CLIP PLACEMENT;  Surgeon: Wilhelmenia Aloha Raddle., MD;  Location: [facility] ENDOSCOPY;  Service: Gastroenterology;;   HOT HEMOSTASIS N/A 03/06/2021   Procedure: HOT HEMOSTASIS (ARGON PLASMA COAGULATION/BICAP);  Surgeon: Leigh Elspeth SQUIBB, MD;  Location: [facility] ENDOSCOPY;  Service: Gastroenterology;  Laterality: N/A;   INGUINAL HERNIA REPAIR  2005   INSERTION OF ILIAC STENT Bilateral 08/16/2022   Procedure: INSERTION OF ILIAC STENT RIGHT EXTERNAL ARTERY, LEFT COMMON ARTERY, AND RIGHT COMMON ARTERY;  Surgeon: [name] ORN, MD;  [facility] OR;  Service: Vascular;  Laterality: Bilateral;   IR CATHETER TUBE CHANGE  07/08/2022   LUNG LOBECTOMY Right 11/07   RUL   MULTIPLE TOOTH EXTRACTIONS     POLYPECTOMY  11/12/2020   Procedure: POLYPECTOMY;  Surgeon: Legrand Victory LITTIE DOUGLAS, MD;  Location: [facility] ENDOSCOPY;  Service: Gastroenterology;;   POLYPECTOMY  12/24/2021   Procedure: POLYPECTOMY;  Surgeon: Wilhelmenia Aloha Raddle., MD;  Location: [facility] ENDOSCOPY;  Service: Gastroenterology;;   SUBMUCOSAL LIFTING INJECTION  12/24/2021   Procedure: SUBMUCOSAL LIFTING INJECTION;  Surgeon: Wilhelmenia Aloha Raddle., MD;  Location: [facility] ENDOSCOPY;  Service: Gastroenterology;;   TRANSURETHRAL RESECTION OF PROSTATE N/A 05/02/2021   Procedure: TRANSURETHRAL RESECTION OF THE PROSTATE (TURP);  Surgeon: Carolee Sherwood JONETTA DOUGLAS, MD;  Location: [facility] ORS;  Service: Urology;  Laterality: N/A;   ULTRASOUND GUIDANCE FOR VASCULAR ACCESS Bilateral 08/16/2022   Procedure: ULTRASOUND GUIDANCE FOR VASCULAR ACCESS;  Surgeon: [name] ORN, MD;  [facility] OR;  Service: Vascular;  Laterality: Bilateral;   UPPER ESOPHAGEAL ENDOSCOPIC ULTRASOUND (EUS) N/A 12/24/2021   Procedure: UPPER ESOPHAGEAL ENDOSCOPIC ULTRASOUND (EUS);  Surgeon: Wilhelmenia Aloha Raddle., MD;  Location: [facility]OSCOPY;  Service: Gastroenterology;  Laterality: N/A;   UPPER GASTROINTESTINAL ENDOSCOPY     VIDEO BRONCHOSCOPY  07/27/2021   Procedure: VIDEO BRONCHOSCOPY WITHOUT FLUORO;  Surgeon: Shelah Lamar RAMAN, MD;  Location: [facility] ENDOSCOPY;  Service: Cardiopulmonary;;   VIDEO BRONCHOSCOPY Right 05/14/2024   Procedure: VIDEO BRONCHOSCOPY WITHOUT FLUORO;  Surgeon: Jude Harden GAILS, MD;  Location: [facility] ENDOSCOPY;  Service: Cardiopulmonary;  Laterality: Right;    Allergies[1]  Prior to Admission medications  Medication Sig Start Date End Date Taking? Authorizing Provider  albuterol  (VENTOLIN  HFA) 108 (90 Base) MCG/ACT inhaler Inhale 1-2 puffs into the lungs every 6 (six) hours as needed for wheezing or shortness of breath. 12/23/23  Yes [name], DO  amLODipine  (NORVASC ) 10 MG tablet Take 1 tablet (10 mg total) by mouth daily. 12/09/23 12/08/24 Yes [name], DO  aspirin  EC 81 MG tablet Take 1 tablet (81 mg total) by mouth daily. Swallow whole.  04/14/23  Yes [name] ORN, MD  clopidogrel  (PLAVIX ) 75 MG tablet Take 1 tablet (75 mg total) by mouth daily. 12/11/23  Yes Bethanie Cough, PA-C  escitalopram  (LEXAPRO ) 5 MG tablet Take 1 tablet (5 mg total) by mouth daily. 07/08/24 01/04/25 Yes [name], DO  HYDROcodone -acetaminophen  (NORCO) 7.5-325 MG tablet Take 1 tablet by mouth 2 (two) times daily as needed for moderate pain (pain score 4-6). Fill 30 days after last fill. 06/24/24  Yes [name], DO  metoprolol  succinate (TOPROL -XL) 25 MG 24 hr tablet Take 1 tablet (25 mg total) by mouth daily. 06/24/24 06/24/25 Yes [name], DO  mirtazapine  (REMERON ) 7.5 MG tablet Take 2 tablets (15 mg total) by mouth at bedtime. 03/24/24  Yes [name], DO  Pancrelipase , Lip-Prot-Amyl, (ZENPEP ) 20000-63000 units CPEP Take 3 capsules by mouth 3 (three) times daily before meals. 07/05/24  Yes [name], DO  pantoprazole  (PROTONIX ) 40 MG tablet TAKE 1 TABLET EVERY DAY FOR REFLUX 12/09/23  Yes [name], DO  predniSONE  (DELTASONE ) 50 MG tablet Take one tablet 13 hours, 7 hours, and 1 hour prior to scan. 06/23/24  Yes [name] ORN, MD  rosuvastatin  (CRESTOR ) 40 MG tablet Take 1 tablet (40 mg total) by mouth daily. 04/03/23  Yes Jerrell Cleatus Ned, MD  sacubitril -valsartan  (ENTRESTO ) 97-103 MG Take 1 tablet by mouth 2 (two) times daily. 04/19/24  Yes [name], DO  spironolactone  (ALDACTONE ) 25 MG tablet Take 0.5 tablets (12.5 mg total) by mouth daily. 06/07/24 09/05/24 Yes [name], Callie E, PA-C  Tiotropium Bromide-Olodaterol (STIOLTO RESPIMAT ) 2.5-2.5 MCG/ACT AERS Inhale 2 puffs into the lungs daily. 02/27/24  Yes Hattar, Zola SAILOR, MD  diphenhydrAMINE  (BENADRYL ) 50 MG capsule Take one capsule 1 hour prior to scan. Patient not taking: Reported on 07/11/2024 06/23/24   [name] ORN, MD  furosemide  (LASIX ) 40 MG tablet Take 1 tablet (40 mg total) by mouth daily. Patient not taking: Reported on 07/11/2024 05/29/24   Harrie Bruckner, DO  OXYGEN Inhale 3 L/min into the lungs continuous.    [provider]    Social History   Socioeconomic History   Marital status: Single    Spouse name: Not on file   Number of children: 0   Years of education: Not on file   Highest education level: Not on file  Occupational History   Not on file  Tobacco Use   Smoking status: Some Days    Current packs/day: 0.75    Average packs/day: 0.8 packs/day for 55.0 years (41.3 ttl pk-yrs)    Types: Cigarettes   Smokeless tobacco: Never   Tobacco comments:    Smokes 2 -3 cigarettes per day sometimes.  Vaping Use   Vaping  status: Never Used  Substance and Sexual Activity   Alcohol use: Not Currently   Drug use: No   Sexual activity: Not on file  Other Topics Concern   Not on file  Social History Narrative   Patient is single reports no children   Smoker, no current alcohol or drug use   Social Drivers of Health   Tobacco Use: High Risk (07/11/2024)   Patient History    Smoking Tobacco Use: Some Days    Smokeless Tobacco Use: Never    Passive Exposure: Not on file  Financial Resource Strain: Low Risk (06/22/2024)   Overall Financial Resource Strain (CARDIA)    Difficulty of Paying Living Expenses: Not hard at all  Food Insecurity: No Food Insecurity (06/22/2024)   Epic    Worried About Radiation Protection Practitioner of Food in the Last Year: Never true    Ran Out of Food in the Last Year: Never true  Transportation Needs: No Transportation Needs (06/22/2024)   Epic    Lack of Transportation (Medical): No    Lack of Transportation (Non-Medical): No  Physical Activity: Inactive (06/22/2024)   Exercise Vital Sign    Days of Exercise per Week: 0 days    Minutes of Exercise per Session: 0 min  Stress: No Stress Concern Present (06/22/2024)   [employer] of Occupational Health - Occupational Stress Questionnaire    Feeling of Stress: Not at all  Social Connections: Moderately Isolated (06/22/2024)   Social Connection and  Isolation Panel    Frequency of Communication with Friends and Family: More than three times a week    Frequency of Social Gatherings with Friends and Family: Twice a week    Attends Religious Services: More than 4 times per year    Active Member of [employer] or Organizations: No    Attends Banker Meetings: Never    Marital Status: Divorced  Catering Manager Violence: Not At Risk (06/22/2024)   Epic    Fear of Current or Ex-Partner: No    Emotionally Abused: No    Physically Abused: No    Sexually Abused: No  Depression (PHQ2-9): Low Risk (06/22/2024)   Depression (PHQ2-9)    PHQ-2 Score: 0  Alcohol Screen: Low Risk (06/22/2024)   Alcohol Screen    Last Alcohol Screening Score (AUDIT): 0  Housing: Unknown (06/22/2024)   Epic    Unable to Pay for Housing in the Last Year: No    Number of Times Moved in the Last Year: Not on file    Homeless in the Last Year: No  Utilities: Not At Risk (06/22/2024)   Epic    Threatened with loss of utilities: No  Health Literacy: Adequate Health Literacy (06/22/2024)   B1300 Health Literacy    Frequency of need for help with medical instructions: Rarely    Family History  Problem Relation Age of Onset   Diabetes Mother    Hypertension Sister    Heart disease Sister    Diabetes Sister    Arthritis Brother    Gout Brother    Colon cancer Neg Hx    Esophageal cancer Neg Hx    Rectal cancer Neg Hx    Stomach cancer Neg Hx    Colon polyps Neg Hx    Inflammatory bowel disease Neg Hx    Liver disease Neg Hx    Pancreatic cancer Neg Hx     ROS: Otherwise negative unless mentioned in HPI  Physical Examination  Vitals:   07/12/24 0453  07/12/24 0715  BP:  (!) 119/51  Pulse:    Resp:  11  Temp: 97.9 F (36.6 C)   SpO2:     Body mass index is 17.94 kg/m.  General: no acute distress Cardiac: hemodynamically stable Neuro: alert, no focal deficit Extremities: No edema or cyanosis Vascular:   Right: Palpable femoral  Left: Palpable  femoral   Data:   CT reviewed. There is some flow around the proximal edge of the graft although it is sealed through the visceral segment without any flow into the aneurysm sac.  Therefore this is not an endoleak.    ASSESSMENT/PLAN: This is a 79 y.o. male with COPD exacerbation.  His fenestrated repair that was done in 2025 looks great.  All visceral stents are widely patent and the aneurysm is sealed.  There is some flow around the proximal edge of the graft but there is no flow into the aneurysm as there is sealed vascular segment. This area of flow around the graft may represent and focal dissection but has been present since at least Nov 2025 where it can be see on his CT chest.  Will plan to continue follow-up every 6 months    Norman GORMAN Serve MD [facility] [phone] 07/12/2024  7:44 AM     [1]  Allergies Allergen Reactions   Candesartan Cilexetil-Hctz Other (See Comments)    Acute renal failure   Hydrochlorothiazide Other (See Comments)    Acute renal failure   Shellfish Allergy Other (See Comments)    Causes GOUT   Betadine [Povidone Iodine ] Itching   "

## 2024-07-12 NOTE — Discharge Planning (Signed)
 Transition of Care [facility]) - Inpatient Brief Assessment   Patient Details  Name: [name] MRN: [id number] Date of Birth: [birth year]  [facility]) CM/SW Contact:    Debarah Saunas, RN Phone Number: 07/12/2024, 8:05 AM   Clinical Narrative: Inpatient Care Management (ICM) has reviewed patient at bedside and no ICM needs have been identified at this time. We will continue to monitor patient advancement through interdisciplinary progression rounds. If new patient transition needs arise, please place a ICM consult.    Transition of Care Asessment: Insurance and Status: Insurance coverage has been reviewed Patient has primary care physician: Yes Christine, Dayton [name], DO) Home environment has been reviewed: From home alone Prior level of function:: independent, uses cane, [employer] provides transportation for medical appointments Prior/Current Home Services: Current home services ([facility] (RN for monthly catheter change)) Social Drivers of Health Review: SDOH reviewed no interventions necessary Readmission risk has been reviewed: No Transition of care needs: no transition of care needs at this time

## 2024-07-12 NOTE — ED Notes (Signed)
 Patient transported to Ultrasound

## 2024-07-12 NOTE — Care Management Obs Status (Signed)
 MEDICARE OBSERVATION STATUS NOTIFICATION   Patient Details  Name: [name] MRN: [id number] Date of Birth: 20-Oct-1945   Medicare Observation Status Notification Given:  Yes    Debarah Saunas, RN 07/12/2024, 7:49 AM

## 2024-07-12 NOTE — ED Notes (Signed)
 Went to pull labs and give 8'0 clock meds pt states you got to wait I'm eating right now.

## 2024-07-12 NOTE — Evaluation (Signed)
 Occupational Therapy Evaluation Patient Details Name: [name] MRN: [id number] DOB: 1945-11-01 Today's Date: 07/12/2024   History of Present Illness   Patient is a 79 yo male presenting to the ED with chest pain and SOB on 07/11/24. Admitted with acute respiratory failure. Placed on 15L NRB. 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 Patient wears 3L O2 at baseline, and is currently on 4L at 100%. Of note, patient's HR ranging from 46 to 98 throughout session, but asymptomatic. Patient is back to his baseline from an OT perspective, but would benefit from PT and mobility specialists during acute stay in order to maintain overall activity tolerance. OT will sign off at this time. Please re-consult if further acute needs arise.  BP 111/77 (89) in standing      If plan is discharge home, recommend the following:   Assist for transportation     Functional Status Assessment   Patient has had a recent decline in their functional status and/or demonstrates limited ability to make significant improvements in function in a reasonable and predictable amount of time     Equipment Recommendations   None recommended by OT     Recommendations for Other Services         Precautions/Restrictions   Precautions Precautions: Fall;Other (comment) Recall of Precautions/Restrictions: Intact Precaution/Restrictions Comments: watch O2 Restrictions Weight Bearing Restrictions Per Provider Order: No     Mobility Bed Mobility Overal bed mobility: Modified Independent                  Transfers Overall transfer level: Modified independent Equipment used: None               General transfer comment: statically standing for BP, no assist  needed      Balance Overall balance assessment: Modified Independent                                         ADL either performed or assessed with clinical judgement   ADL Overall ADL's : At baseline                                       General ADL Comments: Prior to this admission, patient living alone however has family checks on him regularly. Patient wears 3L O2 at baseline, and is currently on 4L at 100%. Patient is back to his baseline from an OT perspective, but would benefit from PT and mobility specialists during acute stay in order to maintain overall activity tolerance. OT will sign off at this time. Please re-consult if further acute needs arise.     Vision Baseline Vision/History: 1 Wears glasses Ability to See in Adequate Light: 0 Adequate Patient Visual Report: No change from baseline Vision Assessment?: No apparent visual deficits     Perception Perception: Not tested       Praxis Praxis: Not tested       Pertinent Vitals/Pain Pain Assessment Pain Assessment: No/denies pain     Extremity/Trunk Assessment Upper Extremity Assessment Upper Extremity Assessment: Right hand dominant;Overall [facility] for tasks assessed  Lower Extremity Assessment Lower Extremity Assessment: Defer to PT evaluation   Cervical / Trunk Assessment Cervical / Trunk Assessment: Kyphotic (minimally)   Communication Communication Communication: No apparent difficulties   Cognition Arousal: Alert Behavior During Therapy: WFL for tasks assessed/performed Cognition: No apparent impairments                               Following commands: Intact       Cueing  General Comments   Cueing Techniques: Verbal cues  VSS on 4L, BP 111/77 (89) HR 46-98   Exercises     Shoulder Instructions      Home Living Family/patient expects to be discharged to:: Private residence Living Arrangements: Alone Available Help at Discharge:  Family;Available PRN/intermittently Type of Home: Apartment Home Access: Stairs to enter Entrance Stairs-Number of Steps: 2 Entrance Stairs-Rails: Right Home Layout: One level     Bathroom Shower/Tub: Tub/shower unit;Sponge bathes at baseline   [employer]: Standard     Home Equipment: Agricultural Consultant (2 wheels);Rollator (4 wheels);Cane - single point          Prior Functioning/Environment Prior Level of Function : Independent/Modified Independent             Mobility Comments: typically uses his cane, one fall in the last 3 years ADLs Comments: independent, does not drive, manages medications independently    OT Problem List: Decreased activity tolerance   OT Treatment/Interventions:        OT Goals(Current goals can be found in the care plan section)   Acute Rehab OT Goals Patient Stated Goal: to get better OT Goal Formulation: With patient Time For Goal Achievement: 07/26/24 Potential to Achieve Goals: Good   OT Frequency:       Co-evaluation              AM-PAC OT 6 Clicks Daily Activity     Outcome Measure Help from another person eating meals?: None Help from another person taking care of personal grooming?: None Help from another person toileting, which includes using toliet, bedpan, or urinal?: None Help from another person bathing (including washing, rinsing, drying)?: None Help from another person to put on and taking off regular upper body clothing?: None Help from another person to put on and taking off regular lower body clothing?: None 6 Click Score: 24   End of Session Nurse Communication: Mobility status;Other (comment) (HR)  Activity Tolerance: Patient tolerated treatment well Patient left: in bed;with call bell/phone within reach  OT Visit Diagnosis: Muscle weakness (generalized) (M62.81)                Time: 9091-9076 OT Time Calculation (min): 15 min Charges:  OT General Charges $OT Visit: 1 Visit OT Evaluation $OT  Eval Moderate Complexity: 1 Mod  Ronal Gift E. Generoso Cropper, OTR/L Acute Rehabilitation Services [phone]   Ronal Gift Salt 07/12/2024, 9:55 AM

## 2024-07-12 NOTE — ED Notes (Signed)
 MD tan told this RN to hold blood product administration.

## 2024-07-13 ENCOUNTER — Telehealth

## 2024-07-13 DIAGNOSIS — D62 Acute posthemorrhagic anemia: Secondary | ICD-10-CM | POA: Diagnosis not present

## 2024-07-13 DIAGNOSIS — I130 Hypertensive heart and chronic kidney disease with heart failure and stage 1 through stage 4 chronic kidney disease, or unspecified chronic kidney disease: Secondary | ICD-10-CM | POA: Diagnosis not present

## 2024-07-13 DIAGNOSIS — I719 Aortic aneurysm of unspecified site, without rupture: Secondary | ICD-10-CM | POA: Diagnosis not present

## 2024-07-13 DIAGNOSIS — I5020 Unspecified systolic (congestive) heart failure: Secondary | ICD-10-CM | POA: Diagnosis not present

## 2024-07-13 DIAGNOSIS — D500 Iron deficiency anemia secondary to blood loss (chronic): Secondary | ICD-10-CM

## 2024-07-13 DIAGNOSIS — E785 Hyperlipidemia, unspecified: Secondary | ICD-10-CM | POA: Diagnosis not present

## 2024-07-13 DIAGNOSIS — J9621 Acute and chronic respiratory failure with hypoxia: Secondary | ICD-10-CM | POA: Diagnosis not present

## 2024-07-13 DIAGNOSIS — I4819 Other persistent atrial fibrillation: Secondary | ICD-10-CM | POA: Diagnosis not present

## 2024-07-13 DIAGNOSIS — C241 Malignant neoplasm of ampulla of Vater: Secondary | ICD-10-CM | POA: Diagnosis not present

## 2024-07-13 DIAGNOSIS — R0602 Shortness of breath: Secondary | ICD-10-CM

## 2024-07-13 DIAGNOSIS — J449 Chronic obstructive pulmonary disease, unspecified: Secondary | ICD-10-CM | POA: Diagnosis not present

## 2024-07-13 DIAGNOSIS — R64 Cachexia: Secondary | ICD-10-CM | POA: Diagnosis not present

## 2024-07-13 DIAGNOSIS — K219 Gastro-esophageal reflux disease without esophagitis: Secondary | ICD-10-CM | POA: Diagnosis not present

## 2024-07-13 DIAGNOSIS — N183 Chronic kidney disease, stage 3 unspecified: Secondary | ICD-10-CM | POA: Diagnosis not present

## 2024-07-13 LAB — CBC WITH DIFFERENTIAL/PLATELET
Abs Immature Granulocytes: 0.02 10*3/uL (ref 0.00–0.07)
Abs Immature Granulocytes: 0.04 10*3/uL (ref 0.00–0.07)
Abs Immature Granulocytes: 0.02 10*3/uL (ref 0.00–0.07)
Basophils Absolute: 0 10*3/uL (ref 0.0–0.1)
Basophils Absolute: 0 10*3/uL (ref 0.0–0.1)
Basophils Absolute: 0 10*3/uL (ref 0.0–0.1)
Basophils Relative: 0 %
Basophils Relative: 0 %
Basophils Relative: 0 %
Eosinophils Absolute: 0 10*3/uL (ref 0.0–0.5)
Eosinophils Absolute: 0 10*3/uL (ref 0.0–0.5)
Eosinophils Absolute: 0 10*3/uL (ref 0.0–0.5)
Eosinophils Relative: 0 %
Eosinophils Relative: 1 %
Eosinophils Relative: 1 %
HCT: 21.3 % — ABNORMAL LOW (ref 39.0–52.0)
HCT: 27.2 % — ABNORMAL LOW (ref 39.0–52.0)
HCT: 25.8 % — ABNORMAL LOW (ref 39.0–52.0)
Hemoglobin: 6.6 g/dL — CL (ref 13.0–17.0)
Hemoglobin: 8.6 g/dL — ABNORMAL LOW (ref 13.0–17.0)
Hemoglobin: 8.2 g/dL — ABNORMAL LOW (ref 13.0–17.0)
Immature Granulocytes: 0 %
Immature Granulocytes: 1 %
Immature Granulocytes: 0 %
Lymphocytes Relative: 16 %
Lymphocytes Relative: 16 %
Lymphocytes Relative: 15 %
Lymphs Abs: 1.1 10*3/uL (ref 0.7–4.0)
Lymphs Abs: 1.1 10*3/uL (ref 0.7–4.0)
Lymphs Abs: 0.9 10*3/uL (ref 0.7–4.0)
MCH: 31.7 pg (ref 26.0–34.0)
MCH: 30.7 pg (ref 26.0–34.0)
MCH: 30.7 pg (ref 26.0–34.0)
MCHC: 31 g/dL (ref 30.0–36.0)
MCHC: 31.6 g/dL (ref 30.0–36.0)
MCHC: 31.8 g/dL (ref 30.0–36.0)
MCV: 102.4 fL — ABNORMAL HIGH (ref 80.0–100.0)
MCV: 97.1 fL (ref 80.0–100.0)
MCV: 96.6 fL (ref 80.0–100.0)
Monocytes Absolute: 0.5 10*3/uL (ref 0.1–1.0)
Monocytes Absolute: 0.5 10*3/uL (ref 0.1–1.0)
Monocytes Absolute: 0.4 10*3/uL (ref 0.1–1.0)
Monocytes Relative: 7 %
Monocytes Relative: 7 %
Monocytes Relative: 7 %
Neutro Abs: 5 10*3/uL (ref 1.7–7.7)
Neutro Abs: 5.2 10*3/uL (ref 1.7–7.7)
Neutro Abs: 4.7 10*3/uL (ref 1.7–7.7)
Neutrophils Relative %: 77 %
Neutrophils Relative %: 75 %
Neutrophils Relative %: 77 %
Platelets: 84 10*3/uL — ABNORMAL LOW (ref 150–400)
Platelets: 88 10*3/uL — ABNORMAL LOW (ref 150–400)
Platelets: 80 10*3/uL — ABNORMAL LOW (ref 150–400)
RBC: 2.08 MIL/uL — ABNORMAL LOW (ref 4.22–5.81)
RBC: 2.8 MIL/uL — ABNORMAL LOW (ref 4.22–5.81)
RBC: 2.67 MIL/uL — ABNORMAL LOW (ref 4.22–5.81)
RDW: 16 % — ABNORMAL HIGH (ref 11.5–15.5)
RDW: 19.4 % — ABNORMAL HIGH (ref 11.5–15.5)
RDW: 19.5 % — ABNORMAL HIGH (ref 11.5–15.5)
WBC: 6.5 10*3/uL (ref 4.0–10.5)
WBC: 6.9 10*3/uL (ref 4.0–10.5)
WBC: 6.1 10*3/uL (ref 4.0–10.5)
nRBC: 0 % (ref 0.0–0.2)
nRBC: 0 % (ref 0.0–0.2)
nRBC: 0 % (ref 0.0–0.2)

## 2024-07-13 LAB — BASIC METABOLIC PANEL WITH GFR
Anion gap: 8 (ref 5–15)
BUN: 32 mg/dL — ABNORMAL HIGH (ref 8–23)
CO2: 26 mmol/L (ref 22–32)
Calcium: 8.3 mg/dL — ABNORMAL LOW (ref 8.9–10.3)
Chloride: 107 mmol/L (ref 98–111)
Creatinine, Ser: 1.52 mg/dL — ABNORMAL HIGH (ref 0.61–1.24)
GFR, Estimated: 47 mL/min — ABNORMAL LOW
Glucose, Bld: 96 mg/dL (ref 70–99)
Potassium: 4.5 mmol/L (ref 3.5–5.1)
Sodium: 140 mmol/L (ref 135–145)

## 2024-07-13 LAB — PREPARE RBC (CROSSMATCH)

## 2024-07-13 MED ORDER — POLYETHYLENE GLYCOL 3350 17 G PO PACK
17.0000 g | PACK | Freq: Every day | ORAL | Status: DC
  Administered 2024-07-15: 17 g via ORAL
  Filled 2024-07-13 (×2): qty 1

## 2024-07-13 MED ORDER — SODIUM CHLORIDE 0.9% IV SOLUTION
Freq: Once | INTRAVENOUS | Status: AC
  Administered 2024-07-13: 10 mL/h via INTRAVENOUS

## 2024-07-13 MED ORDER — PANTOPRAZOLE SODIUM 40 MG IV SOLR
40.0000 mg | Freq: Two times a day (BID) | INTRAVENOUS | Status: DC
  Administered 2024-07-13 – 2024-07-14 (×3): 40 mg via INTRAVENOUS
  Filled 2024-07-13 (×3): qty 10

## 2024-07-13 NOTE — Progress Notes (Signed)
 Mobility Specialist Progress Note:    07/13/24 1224  Mobility  Activity Dangled on edge of bed (Ankle Pump, Leg lifts, heel slides)  Level of Assistance Standby assist, set-up cues, supervision of patient - no hands on  Assistive Device None  Range of Motion/Exercises Active  Activity Response Tolerated well  Mobility Referral Yes  Mobility visit 1 Mobility  Mobility Specialist Start Time (ACUTE ONLY) 1224  Mobility Specialist Stop Time (ACUTE ONLY) 1241  Mobility Specialist Time Calculation (min) (ACUTE ONLY) 17 min   Received pt laying in bed not agreeable to ambulating at the time but will sit EOB and do mobility. No c/o any symptoms. Pt moving well w/ no assist. Pt exclaimed that he is feeling excellent. Returned pt to bed w/ all needs met.   Venetia Keel Mobility Specialist Please Neurosurgeon or Rehab Office at [phone]

## 2024-07-13 NOTE — Consult Note (Incomplete Revision)
 "   Consultation  Referring Provider: Medicine service/[facility] Primary Care Physician:  [name], DO Primary Gastroenterologist:  Dr. Avram  Reason for Consultation: Melena, anemia  HPI: [name] is a 79 y.o. 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  At that time not felt to be likely amenable to endoscopic resection due to location/positioning and patient's anatomy with a J-shaped stomach.  At the time of his last office visit in February 2024 he was waiting to undergo repair of the abdominal aortic aneurysm with stent graft and plan was to have him follow-up after that and discuss potential ampullectomy.  Referral was actually sent but the patient did not follow up in the office.  Currently admitted 2 days ago after he presented to the emergency room with complaints of chest pain and shortness of breath.  Initial workup in the emergency room on 07/11/2024 with CT angio chest abdomen and pelvis showed the native abdominal aortic aneurysm sac to be 6.2 cm, and then new since July 2025 was not enhancing endoleak or developing new aortic dissection at the level of the diaphragm located laterally on the left at the proximal endograft landing site.  This measured 17 mm in length and 16 mm in thickness.  There is chronic pulmonary artery and artery enlargement suggesting pulmonary artery hypertension, chronic COPD with right lung volume loss and enhancing atelectasis/prior right upper lobectomy there is capacious 10 cm air and enteric contents/stool containing viscus at the  anterior pelvic inlet indeterminate for giant diverticulum versus bowel anastomosis.  Vascular surgery was consulted, and do not feel that he has an acute endoleak, he is felt to have a sealed aneurysm throughout to the visceral segment without any flow into the aneurysm.  There may have been a focal dissection but this has not changed since November 2025.  BNP markedly elevated on admission at 8200 Troponins bland 1/25 2026 hemoglobin 10.9> 10.2> 6.8 Platelets 80,000 BUN 29/creatinine 1.58 LFTs within normal limits Ferritin 155/serum iron 49/TIBC 294/iron saturation 17  Transfused x 1 with hemoglobin up to 8/hematocrit 25.7 last p.m. pending this morning  Patient says he feels better since admission, has been diuresed with good response. Reports onset of black stools just since admission, is not aware of having any melena or hematochezia prior to admission.  He had been taking his baby aspirin  and Plavix  up until the day of admission  Been started on Lovenox  here which has been discontinued since onset of melena  Patient has no complaints of abdominal discomfort, no dysphagia or dyne aphasia, no issues with nausea or vomiting, no changes in bowel habits.  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  s/p stent to prox RCA and mid Cx.   Cataract    Cholelithiasis    Chronic osteomyelitis involving ankle and foot, right (HCC) 07/13/2018   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   2006 - 3 adenomas largest 12 mm  2008 - no polyps 2013-  2 mm cecal polyp, adenoma, routine repeat colonoscopy 02/2017 approx 05/14/2017 18 polyps max 12 mm ALL adenomas recall 2019 [name], MD, [facility]      Dieulafoy lesion of duodenum    Diverticulosis of colon    DJD (degenerative joint disease)    left hip   Full dentures    upper and lower   GERD (gastroesophageal reflux disease)    diet controlled    Gout    feet   Hiatal hernia    2 cm noted on upper endos 2011    Hx of adenomatous colonic polyps    Hyperlipidemia    Hypertension    Hypoxia 05/31/2024   Internal hemorrhoid    Lung cancer (HCC)    squamous cell ([id number]), 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 pt was on Plavix  x 3 years then transitioned to ASA, no problems since   Non-small cell carcinoma of right lung, stage 1 (HCC) 01/30/2016   Renal failure, acute    hx of 2/2 medication, resolved   Tobacco abuse    smoker .5 ppd x 55 yrs   UTI (lower urinary tract infection)     Past Surgical History:  Procedure Laterality Date   ABDOMINAL AORTIC ENDOVASCULAR FENESTRATED STENT GRAFT N/A 08/16/2022   Procedure: ABDOMINAL AORTIC ENDOVASCULAR FENESTRATED STENT GRAFT;  Surgeon: [name] ORN, MD;  Location: [facility] OR;  Service: Vascular;  Laterality: N/A;   ABDOMINAL AORTIC ENDOVASCULAR FENESTRATED STENT GRAFT N/A 12/02/2023   Procedure: ABDOMINAL AORTIC ENDOVASCULAR FENESTRATED STENT GRAFT WITH ULTRASOUND GUIDED ACCESS;  Surgeon: [name] ORN, MD;  Location: [facility] INVASIVE CV LAB;  Service: Cardiovascular;  Laterality: N/A;   BIOPSY  11/12/2020   Procedure: BIOPSY;  Surgeon: Legrand Victory LITTIE DOUGLAS, MD;  Location: [facility] ENDOSCOPY;  Service: Gastroenterology;;   BIOPSY  07/25/2021    Procedure: BIOPSY;  Surgeon: Stacia Glendia BRAVO, MD;  Location: [facility] ENDOSCOPY;  Service: Gastroenterology;;   BIOPSY  12/24/2021   Procedure: BIOPSY;  Surgeon: Wilhelmenia Aloha Raddle., MD;  Location: [facility] ENDOSCOPY;  Service: Gastroenterology;;   BRONCHIAL BIOPSY  07/27/2021   Procedure: BRONCHIAL BIOPSIES;  Surgeon: Shelah Lamar RAMAN, MD;  Location: [facility] ENDOSCOPY;  Service: Cardiopulmonary;;   BRONCHIAL BRUSHINGS  07/27/2021   Procedure: BRONCHIAL BRUSHINGS;  Surgeon: Shelah Lamar RAMAN, MD;  Location: [facility] ENDOSCOPY;  Service: Cardiopulmonary;;   cataract surgery Bilateral 2018   removal of cataracts   COLONOSCOPY     hx polyps - Avram 02/2012   COLONOSCOPY WITH PROPOFOL  N/A 11/12/2020   Procedure: COLONOSCOPY WITH PROPOFOL ;  Surgeon: Legrand Victory LITTIE DOUGLAS, MD;  Location: [facility] ENDOSCOPY;  Service: Gastroenterology;  Laterality: N/A;   CORONARY ANGIOPLASTY WITH STENT PLACEMENT     ENDOVASCULAR STENT INSERTION Bilateral 08/16/2022   Procedure: ENDOVASCULAR STENT GRAFT INSERTION OF BILATERAL RENAL ARTERIES;  Surgeon: [name] ORN, MD;  Location: [facility] OR;  Service: Vascular;  Laterality: Bilateral;   ESOPHAGOGASTRODUODENOSCOPY (EGD) WITH PROPOFOL  N/A 11/12/2020   Procedure: ESOPHAGOGASTRODUODENOSCOPY (EGD) WITH PROPOFOL ;  Surgeon: Legrand Victory LITTIE DOUGLAS, MD;  Location: [facility] ENDOSCOPY;  Service: Gastroenterology;  Laterality: N/A;   ESOPHAGOGASTRODUODENOSCOPY (EGD) WITH PROPOFOL  N/A 03/06/2021   Procedure: ESOPHAGOGASTRODUODENOSCOPY (EGD) WITH PROPOFOL ;  Surgeon: Leigh Elspeth SQUIBB, MD;  Location: [facility] ENDOSCOPY;  Service: Gastroenterology;  Laterality: N/A;   ESOPHAGOGASTRODUODENOSCOPY (EGD) WITH PROPOFOL  N/A 07/25/2021   Procedure: ESOPHAGOGASTRODUODENOSCOPY (EGD) WITH PROPOFOL ;  Surgeon: Stacia Glendia BRAVO, MD;  Location: [facility] ENDOSCOPY;  Service: Gastroenterology;  Laterality: N/A;   ESOPHAGOGASTRODUODENOSCOPY (EGD) WITH PROPOFOL  N/A 12/24/2021   Procedure: ESOPHAGOGASTRODUODENOSCOPY (EGD) WITH PROPOFOL ;  Surgeon:  Wilhelmenia Aloha Raddle., MD;  Location: [facility] ENDOSCOPY;  Service: Gastroenterology;  Laterality: N/A;   flexible cystourethroscopy  05/24/06   HEMOSTASIS CLIP PLACEMENT  03/06/2021   Procedure: HEMOSTASIS CLIP PLACEMENT;  Surgeon: Leigh Elspeth SQUIBB, MD;  Location: [facility] ENDOSCOPY;  Service: Gastroenterology;;   HEMOSTASIS CLIP PLACEMENT  12/24/2021   Procedure: HEMOSTASIS CLIP PLACEMENT;  Surgeon: Wilhelmenia Aloha Raddle., MD;  Location: [facility] ENDOSCOPY;  Service: Gastroenterology;;   HOT HEMOSTASIS N/A 03/06/2021   Procedure: HOT HEMOSTASIS (ARGON PLASMA COAGULATION/BICAP);  Surgeon: Leigh Elspeth SQUIBB, MD;  Location: [facility] ENDOSCOPY;  Service: Gastroenterology;  Laterality: N/A;   INGUINAL HERNIA REPAIR  2005   INSERTION OF ILIAC STENT Bilateral 08/16/2022   Procedure: INSERTION OF ILIAC STENT RIGHT EXTERNAL ARTERY, LEFT COMMON ARTERY, AND RIGHT COMMON ARTERY;  Surgeon: [name] ORN, MD;  Location: [facility] OR;  Service: Vascular;  Laterality: Bilateral;   IR CATHETER TUBE CHANGE  07/08/2022   LUNG LOBECTOMY Right 11/07   RUL   MULTIPLE TOOTH EXTRACTIONS     POLYPECTOMY  11/12/2020   Procedure: POLYPECTOMY;  Surgeon: Legrand Victory LITTIE DOUGLAS, MD;  Location: [facility] ENDOSCOPY;  Service: Gastroenterology;;   POLYPECTOMY  12/24/2021   Procedure: POLYPECTOMY;  Surgeon: Wilhelmenia Aloha Raddle., MD;  Location: [facility] ENDOSCOPY;  Service: Gastroenterology;;   SUBMUCOSAL LIFTING INJECTION  12/24/2021   Procedure: SUBMUCOSAL LIFTING INJECTION;  Surgeon: Wilhelmenia Aloha Raddle., MD;  Location: [facility] ENDOSCOPY;  Service: Gastroenterology;;   TRANSURETHRAL RESECTION OF PROSTATE N/A 05/02/2021   Procedure: TRANSURETHRAL RESECTION OF THE PROSTATE (TURP);  Surgeon: Carolee Sherwood JONETTA DOUGLAS, MD;  Location: [facility] ORS;  Service: Urology;  Laterality: N/A;   ULTRASOUND GUIDANCE FOR VASCULAR ACCESS Bilateral 08/16/2022   Procedure: ULTRASOUND GUIDANCE FOR VASCULAR ACCESS;  Surgeon: [name] ORN, MD;  Location: [facility] OR;  Service: Vascular;  Laterality:  Bilateral;   UPPER ESOPHAGEAL ENDOSCOPIC ULTRASOUND (EUS) N/A 12/24/2021   Procedure: UPPER ESOPHAGEAL ENDOSCOPIC ULTRASOUND (EUS);  Surgeon: Wilhelmenia Aloha Raddle., MD;  Location: [facility]OSCOPY;  Service: Gastroenterology;  Laterality: N/A;   UPPER GASTROINTESTINAL ENDOSCOPY     VIDEO BRONCHOSCOPY  07/27/2021   Procedure: VIDEO BRONCHOSCOPY WITHOUT FLUORO;  Surgeon: Shelah Lamar RAMAN, MD;  Location: [facility] ENDOSCOPY;  Service: Cardiopulmonary;;   VIDEO BRONCHOSCOPY Right 05/14/2024   Procedure: VIDEO BRONCHOSCOPY WITHOUT FLUORO;  Surgeon: Jude Harden GAILS, MD;  Location: [facility] ENDOSCOPY;  Service: Cardiopulmonary;  Laterality: Right;    Prior to Admission medications  Medication Sig Start Date End Date Taking? Authorizing Provider  albuterol  (VENTOLIN  HFA) 108 (90 Base) MCG/ACT inhaler Inhale 1-2 puffs into the lungs every 6 (six) hours as needed for wheezing or shortness of breath. 12/23/23  Yes [name], DO  amLODipine  (NORVASC ) 10 MG tablet Take 1 tablet (10 mg total) by mouth daily. 12/09/23 12/08/24 Yes Rosan Dayton  C, DO  aspirin  EC 81 MG tablet Take 1 tablet (81 mg total) by mouth daily. Swallow whole. 04/14/23  Yes [name] ORN, MD  clopidogrel  (PLAVIX ) 75 MG tablet Take 1 tablet (75 mg total) by mouth daily. 12/11/23  Yes Bethanie Cough, PA-C  escitalopram  (LEXAPRO ) 5 MG tablet Take 1 tablet (5 mg total) by mouth daily. 07/08/24 01/04/25 Yes [name], DO  HYDROcodone -acetaminophen  (NORCO) 7.5-325 MG tablet Take 1 tablet by mouth 2 (two) times daily as needed for moderate pain (pain score 4-6). Fill 30 days after last fill. 06/24/24  Yes [name], DO  metoprolol  succinate (TOPROL -XL) 25 MG 24 hr tablet Take 1 tablet (25 mg total) by mouth daily. 06/24/24 06/24/25 Yes [name], DO  mirtazapine  (REMERON ) 7.5 MG tablet Take 2 tablets (15 mg total) by mouth at bedtime. 03/24/24  Yes [name], DO  Pancrelipase , Lip-Prot-Amyl, (ZENPEP ) 20000-63000 units CPEP Take 3 capsules by  mouth 3 (three) times daily before meals. 07/05/24  Yes [name], DO  pantoprazole  (PROTONIX ) 40 MG tablet TAKE 1 TABLET EVERY DAY FOR REFLUX 12/09/23  Yes [name], DO  predniSONE  (DELTASONE ) 50 MG tablet Take one tablet 13 hours, 7 hours, and 1 hour prior to scan. 06/23/24  Yes [name] ORN, MD  rosuvastatin  (CRESTOR ) 40 MG tablet Take 1 tablet (40 mg total) by mouth daily. 04/03/23  Yes Jerrell Cleatus Ned, MD  sacubitril -valsartan  (ENTRESTO ) 97-103 MG Take 1 tablet by mouth 2 (two) times daily. 04/19/24  Yes [name], DO  spironolactone  (ALDACTONE ) 25 MG tablet Take 0.5 tablets (12.5 mg total) by mouth daily. 06/07/24 09/05/24 Yes [name], Callie E, PA-C  Tiotropium Bromide-Olodaterol (STIOLTO RESPIMAT ) 2.5-2.5 MCG/ACT AERS Inhale 2 puffs into the lungs daily. 02/27/24  Yes Hattar, Zola SAILOR, MD  diphenhydrAMINE  (BENADRYL ) 50 MG capsule Take one capsule 1 hour prior to scan. Patient not taking: Reported on 07/11/2024 06/23/24   [name] ORN, MD  furosemide  (LASIX ) 40 MG tablet Take 1 tablet (40 mg total) by mouth daily. Patient not taking: Reported on 07/11/2024 05/29/24   Harrie Bruckner, DO  OXYGEN Inhale 3 L/min into the lungs continuous.    [provider]    Current Facility-Administered Medications  Medication Dose Route Frequency Provider Last Rate Last Admin   0.9 %  sodium chloride  infusion (Manually program via Guardrails IV Fluids)   Intravenous Once Marylu Gee, DO       acetaminophen  (TYLENOL ) tablet 650 mg  650 mg Oral Q6H PRN Celestina Czar, MD       Or   acetaminophen  (TYLENOL ) suppository 650 mg  650 mg Rectal Q6H PRN Celestina Czar, MD       albuterol  (PROVENTIL ) (2.5 MG/3ML) 0.083% nebulizer solution 2.5 mg  2.5 mg Inhalation Q6H PRN Waymond Cart, MD       arformoterol  (BROVANA ) nebulizer solution 15 mcg  15 mcg Nebulization BID Tan, Dawson, MD   15 mcg at 07/13/24 1008   And   umeclidinium bromide  (INCRUSE ELLIPTA ) 62.5 MCG/ACT 1 puff   1 puff Inhalation Daily Waymond Cart, MD   1 puff at 07/11/24 1232   aspirin  EC tablet 81 mg  81 mg Oral Daily Tan, Dawson, MD   81 mg at 07/13/24 1009   escitalopram  (LEXAPRO ) tablet 5 mg  5 mg Oral Daily Tan, Dawson, MD   5 mg at 07/13/24 1009   feeding supplement (ENSURE PLUS HIGH PROTEIN) liquid 237 mL  237 mL Oral  BID BM Tan, Dawson, MD   237 mL at 07/13/24 1009   HYDROcodone -acetaminophen  (NORCO) 7.5-325 MG per tablet 1 tablet  1 tablet Oral BID PRN Tan, Dawson, MD   1 tablet at 07/13/24 1012   lipase/protease/amylase (CREON ) capsule 60,000 Units  60,000 Units Oral TID AC Tan, Dawson, MD   60,000 Units at 07/13/24 1010   mirtazapine  (REMERON ) tablet 15 mg  15 mg Oral QHS Waymond Cart, MD   15 mg at 07/11/24 2155   nicotine  (NICODERM CQ  - dosed in mg/24 hr) patch 7 mg  7 mg Transdermal Daily Tan, Dawson, MD   7 mg at 07/13/24 1011   pantoprazole  (PROTONIX ) injection 40 mg  40 mg Intravenous BID Faunce, Alina, DO   40 mg at 07/13/24 1011   polyethylene glycol (MIRALAX  / GLYCOLAX ) packet 17 g  17 g Oral Daily Tan, Dawson, MD       rosuvastatin  (CRESTOR ) tablet 40 mg  40 mg Oral Daily Tan, Dawson, MD   40 mg at 07/13/24 1011    Allergies as of 07/11/2024 - Review Complete 07/11/2024  Allergen Reaction Noted   Candesartan cilexetil-hctz Other (See Comments)    Hydrochlorothiazide Other (See Comments)    Shellfish allergy Other (See Comments) 03/24/2013   Betadine [povidone iodine ] Itching 08/14/2022    Family History  Problem Relation Age of Onset   Diabetes Mother    Hypertension Sister    Heart disease Sister    Diabetes Sister    Arthritis Brother    Gout Brother    Colon cancer Neg Hx    Esophageal cancer Neg Hx    Rectal cancer Neg Hx    Stomach cancer Neg Hx    Colon polyps Neg Hx    Inflammatory bowel disease Neg Hx    Liver disease Neg Hx    Pancreatic cancer Neg Hx     Social History   Socioeconomic History   Marital status: Single    Spouse name: Not on file    Number of children: 0   Years of education: Not on file   Highest education level: Not on file  Occupational History   Not on file  Tobacco Use   Smoking status: Some Days    Current packs/day: 0.75    Average packs/day: 0.8 packs/day for 55.0 years (41.3 ttl pk-yrs)    Types: Cigarettes   Smokeless tobacco: Never   Tobacco comments:    Smokes 2 -3 cigarettes per day sometimes.  Vaping Use   Vaping status: Never Used  Substance and Sexual Activity   Alcohol use: Not Currently   Drug use: No   Sexual activity: Not on file  Other Topics Concern   Not on file  Social History Narrative   Patient is single reports no children   Smoker, no current alcohol or drug use   Social Drivers of Health   Tobacco Use: High Risk (07/12/2024)   Patient History    Smoking Tobacco Use: Some Days    Smokeless Tobacco Use: Never    Passive Exposure: Not on file  Financial Resource Strain: Low Risk (06/22/2024)   Overall Financial Resource Strain (CARDIA)    Difficulty of Paying Living Expenses: Not hard at all  Food Insecurity: No Food Insecurity (07/13/2024)   Epic    Worried About Radiation Protection Practitioner of Food in the Last Year: Never true    Ran Out of Food in the Last Year: Never true  Transportation Needs: No Transportation Needs (07/13/2024)  Epic    Lack of Transportation (Medical): No    Lack of Transportation (Non-Medical): No  Physical Activity: Inactive (06/22/2024)   Exercise Vital Sign    Days of Exercise per Week: 0 days    Minutes of Exercise per Session: 0 min  Stress: No Stress Concern Present (06/22/2024)   [employer] of Occupational Health - Occupational Stress Questionnaire    Feeling of Stress: Not at all  Social Connections: Moderately Isolated (07/13/2024)   Social Connection and Isolation Panel    Frequency of Communication with Friends and Family: More than three times a week    Frequency of Social Gatherings with Friends and Family: Twice a week    Attends Religious  Services: More than 4 times per year    Active Member of Clubs or Organizations: No    Attends Banker Meetings: Never    Marital Status: Divorced  Catering Manager Violence: Not At Risk (07/13/2024)   Epic    Fear of Current or Ex-Partner: No    Emotionally Abused: No    Physically Abused: No    Sexually Abused: No  Depression (PHQ2-9): Low Risk (06/22/2024)   Depression (PHQ2-9)    PHQ-2 Score: 0  Alcohol Screen: Low Risk (06/22/2024)   Alcohol Screen    Last Alcohol Screening Score (AUDIT): 0  Housing: Low Risk (07/13/2024)   Epic    Unable to Pay for Housing in the Last Year: No    Number of Times Moved in the Last Year: 0    Homeless in the Last Year: No  Utilities: Not At Risk (07/13/2024)   Epic    Threatened with loss of utilities: No  Health Literacy: Adequate Health Literacy (06/22/2024)   B1300 Health Literacy    Frequency of need for help with medical instructions: Rarely    Review of Systems: Pertinent positive and negative review of systems were noted in the above HPI section.  All other review of systems was otherwise negative.   Physical Exam: Vital signs in last 24 hours: Temp:  [97.9 F (36.6 C)-99.4 F (37.4 C)] 97.9 F (36.6 C) (01/27 1113) Pulse Rate:  [54-112] 68 (01/27 1113) Resp:  [16-22] 20 (01/27 1113) BP: (120-143)/(56-82) 138/77 (01/27 1113) SpO2:  [90 %-100 %] 95 % (01/27 1113) Weight:  [61.3 kg] 61.3 kg (01/26 1624) Last BM Date : 07/13/24 General:   Alert,  Well-developed, thin elderly African-American male pleasant and cooperative in NAD, on O2 at 3 L Head:  Normocephalic and atraumatic. Eyes:  Sclera clear, no icterus.   Conjunctiva pink. Ears:  Normal auditory acuity. Nose:  No deformity, discharge,  or lesions. Mouth:  No deformity or lesions.   Neck:  Supple; no masses or thyromegaly. Lungs:  Clear throughout to auscultation.   No wheezes, crackles, or rhonchi.  Heart:  Regular rate and rhythm; no murmurs, clicks, rubs,  or  gallops. Abdomen:  Soft,nontender, BS active,nonpalp mass or hsm.   Rectal: Not done, stool reported tarry/black Msk:  Symmetrical without gross deformities. . Pulses:  Normal pulses noted. Extremities:  Without clubbing or edema. Neurologic:  Alert and  oriented x3;  grossly normal neurologically. Skin:  Intact without significant lesions or rashes.. Psych:  Alert and cooperative. Normal mood and affect.  Intake/Output from previous day: 01/26 0701 - 01/27 0700 In: 120 [P.O.:120] Out: 175 [Urine:175] Intake/Output this shift: Total I/O In: 732 [P.O.:360; Blood:372] Out: 425 [Urine:425]  Lab Results: Recent Labs    07/12/24 1901 07/13/24 0240 07/13/24  1051  WBC 7.8 6.5 6.9  HGB 8.0* 6.6* 8.6*  HCT 25.7* 21.3* 27.2*  PLT 100* 84* 88*   BMET Recent Labs    07/11/24 0524 07/11/24 0527 07/11/24 0528 07/12/24 0613 07/13/24 1051  NA 141 144 144 140 140  K 3.9 3.7 3.7 4.6 4.5  CL 107 108  --  109 107  CO2 21*  --   --  22 26  GLUCOSE 103* 102*  --  102* 96  BUN 21 22  --  29* 32*  CREATININE 1.50* 1.60*  --  1.58* 1.52*  CALCIUM  8.7*  --   --  8.6* 8.3*   LFT Recent Labs    07/12/24 0613  PROT 5.6*  ALBUMIN  3.2*  AST 33  ALT 32  ALKPHOS 65  BILITOT 0.4   PT/INR No results for input(s): LABPROT, INR in the last 72 hours. Hepatitis Panel No results for input(s): HEPBSAG, HCVAB, HEPAIGM, HEPBIGM in the last 72 hours.   IMPRESSION:  #31 79 year old male admitted with complaints of chest pain and shortness of breath is this is in the setting of chronic hypoxic respiratory failure on 3 L nasal cannula at home, history of congestive heart failure, and previous abdominal aortic aneurysm repair June 2025.  BNP significantly elevated consistent with component of acute CHF  CT angiography initially raised concern for endoleak at the site of his aortic aneurysm graft repair. CV surgery had been consulted and reviewed and do not feel that he has any active  endoleak and that the changes present on CT angiography have been present at least since November 2025.  #2 chronic antiplatelet therapy-on aspirin  and Plavix  at home-history of coronary artery disease status post previous stenting On hold since admission  #3 new onset melena over the past 2 days.  Also had significant drop in hemoglobin since admission from 10-6.8-transfused yesterday and last hemoglobin up to 8.  Patient had been on prophylactic Lovenox  discontinued last p.m.  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  This was supposed to have been addressed in 2024 with possible referral to [facility] for ampullectomy however patient did not follow-up .  Primary concern would be for enlargement of the ampullary adenoma with ulceration and/or malignant transformation.  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  Chronic kidney disease stage III #7 A-fib not anticoagulated   PLAN: IV PPI twice daily Full liquid diet today, n.p.o. after midnight Continue serial hemoglobins every 6-8 hours and transfuse as indicated We will plan for EGD/EUS with Dr. Wilhelmenia potentially tomorrow morning, as he seems fairly well compensated from a heart failure perspective since diuresis. He will have been off Plavix  for 4 days at that time-so we will be able to biopsy, but would not plan for ampullectomy.  He is also due for follow-up colonoscopy that can be pursued as an outpatient. GI will follow with you.   Amy EsterwoodPA-C  07/13/2024, 12:23 PM      Attending Physician's Attestation   I have taken an interval history, reviewed the chart and examined the patient.   This is a  patient of the GI service  I agree with the Advanced Practitioner's note, impression, and recommendations with updates and my documentation as noted above.  The majority  of the medical decision making/process, formulation of the impression/plan of action for the patient were performed by me with substantive portion of this encounter (>50% time spent including complete performance of at least one of the key components of MDM, History, and/or Exam).   Aloha Finner, MD [facility] Office # [phone]  "

## 2024-07-13 NOTE — Consult Note (Cosign Needed)
 "   Consultation  Referring Provider: Medicine service/[facility] Primary Care Physician:  [name], DO Primary Gastroenterologist:  Dr. Avram  Reason for Consultation: Melena, anemia  HPI: [name] is a 79 y.o. 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  At that time not felt to be likely amenable to endoscopic resection due to location/positioning and patient's anatomy with a J-shaped stomach.  At the time of his last office visit in February 2024 he was waiting to undergo repair of the abdominal aortic aneurysm with stent graft and plan was to have him follow-up after that and discuss potential ampullectomy.  Referral was actually sent but the patient did not follow up in the office.  Currently admitted 2 days ago after he presented to the emergency room with complaints of chest pain and shortness of breath.  Initial workup in the emergency room on 07/11/2024 with CT angio chest abdomen and pelvis showed the native abdominal aortic aneurysm sac to be 6.2 cm, and then new since July 2025 was not enhancing endoleak or developing new aortic dissection at the level of the diaphragm located laterally on the left at the proximal endograft landing site.  This measured 17 mm in length and 16 mm in thickness.  There is chronic pulmonary artery and artery enlargement suggesting pulmonary artery hypertension, chronic COPD with right lung volume loss and enhancing atelectasis/prior right upper lobectomy there is capacious 10 cm air and enteric contents/stool containing viscus at the  anterior pelvic inlet indeterminate for giant diverticulum versus bowel anastomosis.  Vascular surgery was consulted, and do not feel that he has an acute endoleak, he is felt to have a sealed aneurysm throughout to the visceral segment without any flow into the aneurysm.  There may have been a focal dissection but this has not changed since November 2025.  BNP markedly elevated on admission at 8200 Troponins bland 1/25 2026 hemoglobin 10.9> 10.2> 6.8 Platelets 80,000 BUN 29/creatinine 1.58 LFTs within normal limits Ferritin 155/serum iron 49/TIBC 294/iron saturation 17  Transfused x 1 with hemoglobin up to 8/hematocrit 25.7 last p.m. pending this morning  Patient says he feels better since admission, has been diuresed with good response. Reports onset of black stools just since admission, is not aware of having any melena or hematochezia prior to admission.  He had been taking his baby aspirin  and Plavix  up until the day of admission  Been started on Lovenox  here which has been discontinued since onset of melena  Patient has no complaints of abdominal discomfort, no dysphagia or dyne aphasia, no issues with nausea or vomiting, no changes in bowel habits.  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  s/p stent to prox RCA and mid Cx.   Cataract    Cholelithiasis    Chronic osteomyelitis involving ankle and foot, right (HCC) 07/13/2018   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   2006 - 3 adenomas largest 12 mm  2008 - no polyps 2013-  2 mm cecal polyp, adenoma, routine repeat colonoscopy 02/2017 approx 05/14/2017 18 polyps max 12 mm ALL adenomas recall 2019 [name], MD, [facility]      Dieulafoy lesion of duodenum    Diverticulosis of colon    DJD (degenerative joint disease)    left hip   Full dentures    upper and lower   GERD (gastroesophageal reflux disease)    diet controlled    Gout    feet   Hiatal hernia    2 cm noted on upper endos 2011    Hx of adenomatous colonic polyps    Hyperlipidemia    Hypertension    Hypoxia 05/31/2024   Internal hemorrhoid    Lung cancer (HCC)    squamous cell ([id number]), 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 pt was on Plavix  x 3 years then transitioned to ASA, no problems since   Non-small cell carcinoma of right lung, stage 1 (HCC) 01/30/2016   Renal failure, acute    hx of 2/2 medication, resolved   Tobacco abuse    smoker .5 ppd x 55 yrs   UTI (lower urinary tract infection)     Past Surgical History:  Procedure Laterality Date   ABDOMINAL AORTIC ENDOVASCULAR FENESTRATED STENT GRAFT N/A 08/16/2022   Procedure: ABDOMINAL AORTIC ENDOVASCULAR FENESTRATED STENT GRAFT;  Surgeon: [name] ORN, MD;  Location: [facility] OR;  Service: Vascular;  Laterality: N/A;   ABDOMINAL AORTIC ENDOVASCULAR FENESTRATED STENT GRAFT N/A 12/02/2023   Procedure: ABDOMINAL AORTIC ENDOVASCULAR FENESTRATED STENT GRAFT WITH ULTRASOUND GUIDED ACCESS;  Surgeon: [name] ORN, MD;  Location: [facility] INVASIVE CV LAB;  Service: Cardiovascular;  Laterality: N/A;   BIOPSY  11/12/2020   Procedure: BIOPSY;  Surgeon: Legrand Victory LITTIE DOUGLAS, MD;  Location: [facility] ENDOSCOPY;  Service: Gastroenterology;;   BIOPSY  07/25/2021    Procedure: BIOPSY;  Surgeon: Stacia Glendia BRAVO, MD;  Location: [facility] ENDOSCOPY;  Service: Gastroenterology;;   BIOPSY  12/24/2021   Procedure: BIOPSY;  Surgeon: Wilhelmenia Aloha Raddle., MD;  Location: [facility];  Service: Gastroenterology;;   BRONCHIAL BIOPSY  07/27/2021   Procedure: BRONCHIAL BIOPSIES;  Surgeon: Shelah Lamar RAMAN, MD;  Location: [facility] ENDOSCOPY;  Service: Cardiopulmonary;;   BRONCHIAL BRUSHINGS  07/27/2021   Procedure: BRONCHIAL BRUSHINGS;  Surgeon: Shelah Lamar RAMAN, MD;  Location: [facility] ENDOSCOPY;  Service: Cardiopulmonary;;   cataract surgery Bilateral 2018   removal of cataracts   COLONOSCOPY     hx polyps - Avram 02/2012   COLONOSCOPY WITH PROPOFOL  N/A 11/12/2020   Procedure: COLONOSCOPY WITH PROPOFOL ;  Surgeon: Legrand Victory LITTIE DOUGLAS, MD;  Location: [facility] ENDOSCOPY;  Service: Gastroenterology;  Laterality: N/A;   CORONARY ANGIOPLASTY WITH STENT PLACEMENT     ENDOVASCULAR STENT INSERTION Bilateral 08/16/2022   Procedure: ENDOVASCULAR STENT GRAFT INSERTION OF BILATERAL RENAL ARTERIES;  Surgeon: [name] ORN, MD;  Location: [facility] OR;  Service: Vascular;  Laterality: Bilateral;   ESOPHAGOGASTRODUODENOSCOPY (EGD) WITH PROPOFOL  N/A 11/12/2020   Procedure: ESOPHAGOGASTRODUODENOSCOPY (EGD) WITH PROPOFOL ;  Surgeon: Legrand Victory LITTIE DOUGLAS, MD;  Location: [facility] ENDOSCOPY;  Service: Gastroenterology;  Laterality: N/A;   ESOPHAGOGASTRODUODENOSCOPY (EGD) WITH PROPOFOL  N/A 03/06/2021   Procedure: ESOPHAGOGASTRODUODENOSCOPY (EGD) WITH PROPOFOL ;  Surgeon: Leigh Elspeth SQUIBB, MD;  Location: [facility] ENDOSCOPY;  Service: Gastroenterology;  Laterality: N/A;   ESOPHAGOGASTRODUODENOSCOPY (EGD) WITH PROPOFOL  N/A 07/25/2021   Procedure: ESOPHAGOGASTRODUODENOSCOPY (EGD) WITH PROPOFOL ;  Surgeon: Stacia Glendia BRAVO, MD;  Location: [facility] ENDOSCOPY;  Service: Gastroenterology;  Laterality: N/A;   ESOPHAGOGASTRODUODENOSCOPY (EGD) WITH PROPOFOL  N/A 12/24/2021   Procedure: ESOPHAGOGASTRODUODENOSCOPY (EGD) WITH PROPOFOL ;  Surgeon:  Wilhelmenia Aloha Raddle., MD;  Location: [facility] ENDOSCOPY;  Service: Gastroenterology;  Laterality: N/A;   flexible cystourethroscopy  05/24/06   HEMOSTASIS CLIP PLACEMENT  03/06/2021   Procedure: HEMOSTASIS CLIP PLACEMENT;  Surgeon: Leigh Elspeth SQUIBB, MD;  Location: [facility] ENDOSCOPY;  Service: Gastroenterology;;   HEMOSTASIS CLIP PLACEMENT  12/24/2021   Procedure: HEMOSTASIS CLIP PLACEMENT;  Surgeon: Wilhelmenia Aloha Raddle., MD;  Location: [facility] ENDOSCOPY;  Service: Gastroenterology;;   HOT HEMOSTASIS N/A 03/06/2021   Procedure: HOT HEMOSTASIS (ARGON PLASMA COAGULATION/BICAP);  Surgeon: Leigh Elspeth SQUIBB, MD;  Location: [facility];  Service: Gastroenterology;  Laterality: N/A;   INGUINAL HERNIA REPAIR  2005   INSERTION OF ILIAC STENT Bilateral 08/16/2022   Procedure: INSERTION OF ILIAC STENT RIGHT EXTERNAL ARTERY, LEFT COMMON ARTERY, AND RIGHT COMMON ARTERY;  Surgeon: [name] ORN, MD;  Location: [facility] OR;  Service: Vascular;  Laterality: Bilateral;   IR CATHETER TUBE CHANGE  07/08/2022   LUNG LOBECTOMY Right 11/07   RUL   MULTIPLE TOOTH EXTRACTIONS     POLYPECTOMY  11/12/2020   Procedure: POLYPECTOMY;  Surgeon: Legrand Victory LITTIE DOUGLAS, MD;  Location: [facility] ENDOSCOPY;  Service: Gastroenterology;;   POLYPECTOMY  12/24/2021   Procedure: POLYPECTOMY;  Surgeon: Wilhelmenia Aloha Raddle., MD;  Location: [facility];  Service: Gastroenterology;;   SUBMUCOSAL LIFTING INJECTION  12/24/2021   Procedure: SUBMUCOSAL LIFTING INJECTION;  Surgeon: Wilhelmenia Aloha Raddle., MD;  Location: [facility];  Service: Gastroenterology;;   TRANSURETHRAL RESECTION OF PROSTATE N/A 05/02/2021   Procedure: TRANSURETHRAL RESECTION OF THE PROSTATE (TURP);  Surgeon: Carolee Sherwood JONETTA DOUGLAS, MD;  Location: [facility] ORS;  Service: Urology;  Laterality: N/A;   ULTRASOUND GUIDANCE FOR VASCULAR ACCESS Bilateral 08/16/2022   Procedure: ULTRASOUND GUIDANCE FOR VASCULAR ACCESS;  Surgeon: [name] ORN, MD;  Location: [facility] OR;  Service: Vascular;  Laterality:  Bilateral;   UPPER ESOPHAGEAL ENDOSCOPIC ULTRASOUND (EUS) N/A 12/24/2021   Procedure: UPPER ESOPHAGEAL ENDOSCOPIC ULTRASOUND (EUS);  Surgeon: Wilhelmenia Aloha Raddle., MD;  Location: [facility];  Service: Gastroenterology;  Laterality: N/A;   UPPER GASTROINTESTINAL ENDOSCOPY     VIDEO BRONCHOSCOPY  07/27/2021   Procedure: VIDEO BRONCHOSCOPY WITHOUT FLUORO;  Surgeon: Shelah Lamar RAMAN, MD;  Location: [facility] ENDOSCOPY;  Service: Cardiopulmonary;;   VIDEO BRONCHOSCOPY Right 05/14/2024   Procedure: VIDEO BRONCHOSCOPY WITHOUT FLUORO;  Surgeon: Jude Harden GAILS, MD;  Location: [facility] ENDOSCOPY;  Service: Cardiopulmonary;  Laterality: Right;    Prior to Admission medications  Medication Sig Start Date End Date Taking? Authorizing Provider  albuterol  (VENTOLIN  HFA) 108 (90 Base) MCG/ACT inhaler Inhale 1-2 puffs into the lungs every 6 (six) hours as needed for wheezing or shortness of breath. 12/23/23  Yes [name], DO  amLODipine  (NORVASC ) 10 MG tablet Take 1 tablet (10 mg total) by mouth daily. 12/09/23 12/08/24 Yes Rosan Dayton  C, DO  aspirin  EC 81 MG tablet Take 1 tablet (81 mg total) by mouth daily. Swallow whole. 04/14/23  Yes [name] ORN, MD  clopidogrel  (PLAVIX ) 75 MG tablet Take 1 tablet (75 mg total) by mouth daily. 12/11/23  Yes Bethanie Cough, PA-C  escitalopram  (LEXAPRO ) 5 MG tablet Take 1 tablet (5 mg total) by mouth daily. 07/08/24 01/04/25 Yes [name], DO  HYDROcodone -acetaminophen  (NORCO) 7.5-325 MG tablet Take 1 tablet by mouth 2 (two) times daily as needed for moderate pain (pain score 4-6). Fill 30 days after last fill. 06/24/24  Yes [name], DO  metoprolol  succinate (TOPROL -XL) 25 MG 24 hr tablet Take 1 tablet (25 mg total) by mouth daily. 06/24/24 06/24/25 Yes [name], DO  mirtazapine  (REMERON ) 7.5 MG tablet Take 2 tablets (15 mg total) by mouth at bedtime. 03/24/24  Yes [name], DO  Pancrelipase , Lip-Prot-Amyl, (ZENPEP ) 20000-63000 units CPEP Take 3 capsules by  mouth 3 (three) times daily before meals. 07/05/24  Yes [name], DO  pantoprazole  (PROTONIX ) 40 MG tablet TAKE 1 TABLET EVERY DAY FOR REFLUX 12/09/23  Yes [name], DO  predniSONE  (DELTASONE ) 50 MG tablet Take one tablet 13 hours, 7 hours, and 1 hour prior to scan. 06/23/24  Yes [name] ORN, MD  rosuvastatin  (CRESTOR ) 40 MG tablet Take 1 tablet (40 mg total) by mouth daily. 04/03/23  Yes Jerrell Cleatus Ned, MD  sacubitril -valsartan  (ENTRESTO ) 97-103 MG Take 1 tablet by mouth 2 (two) times daily. 04/19/24  Yes [name], DO  spironolactone  (ALDACTONE ) 25 MG tablet Take 0.5 tablets (12.5 mg total) by mouth daily. 06/07/24 09/05/24 Yes [name], Callie E, PA-C  Tiotropium Bromide-Olodaterol (STIOLTO RESPIMAT ) 2.5-2.5 MCG/ACT AERS Inhale 2 puffs into the lungs daily. 02/27/24  Yes Hattar, Zola SAILOR, MD  diphenhydrAMINE  (BENADRYL ) 50 MG capsule Take one capsule 1 hour prior to scan. Patient not taking: Reported on 07/11/2024 06/23/24   [name] ORN, MD  furosemide  (LASIX ) 40 MG tablet Take 1 tablet (40 mg total) by mouth daily. Patient not taking: Reported on 07/11/2024 05/29/24   Harrie Bruckner, DO  OXYGEN Inhale 3 L/min into the lungs continuous.    [provider]    Current Facility-Administered Medications  Medication Dose Route Frequency Provider Last Rate Last Admin   0.9 %  sodium chloride  infusion (Manually program via Guardrails IV Fluids)   Intravenous Once [name] Gee, DO       acetaminophen  (TYLENOL ) tablet 650 mg  650 mg Oral Q6H PRN Celestina Czar, MD       Or   acetaminophen  (TYLENOL ) suppository 650 mg  650 mg Rectal Q6H PRN Celestina Czar, MD       albuterol  (PROVENTIL ) (2.5 MG/3ML) 0.083% nebulizer solution 2.5 mg  2.5 mg Inhalation Q6H PRN Waymond Cart, MD       arformoterol  (BROVANA ) nebulizer solution 15 mcg  15 mcg Nebulization BID Tan, Dawson, MD   15 mcg at 07/13/24 1008   And   umeclidinium bromide  (INCRUSE ELLIPTA ) 62.5 MCG/ACT 1 puff   1 puff Inhalation Daily Waymond Cart, MD   1 puff at 07/11/24 1232   aspirin  EC tablet 81 mg  81 mg Oral Daily Tan, Dawson, MD   81 mg at 07/13/24 1009   escitalopram  (LEXAPRO ) tablet 5 mg  5 mg Oral Daily Tan, Dawson, MD   5 mg at 07/13/24 1009   feeding supplement (ENSURE PLUS HIGH PROTEIN) liquid 237 mL  237 mL Oral  BID BM Tan, Dawson, MD   237 mL at 07/13/24 1009   HYDROcodone -acetaminophen  (NORCO) 7.5-325 MG per tablet 1 tablet  1 tablet Oral BID PRN Tan, Dawson, MD   1 tablet at 07/13/24 1012   lipase/protease/amylase (CREON ) capsule 60,000 Units  60,000 Units Oral TID AC Tan, Dawson, MD   60,000 Units at 07/13/24 1010   mirtazapine  (REMERON ) tablet 15 mg  15 mg Oral QHS Waymond Cart, MD   15 mg at 07/11/24 2155   nicotine  (NICODERM CQ  - dosed in mg/24 hr) patch 7 mg  7 mg Transdermal Daily Tan, Dawson, MD   7 mg at 07/13/24 1011   pantoprazole  (PROTONIX ) injection 40 mg  40 mg Intravenous BID Faunce, Alina, DO   40 mg at 07/13/24 1011   polyethylene glycol (MIRALAX  / GLYCOLAX ) packet 17 g  17 g Oral Daily Tan, Dawson, MD       rosuvastatin  (CRESTOR ) tablet 40 mg  40 mg Oral Daily Tan, Dawson, MD   40 mg at 07/13/24 1011    Allergies as of 07/11/2024 - Review Complete 07/11/2024  Allergen Reaction Noted   Candesartan cilexetil-hctz Other (See Comments)    Hydrochlorothiazide Other (See Comments)    Shellfish allergy Other (See Comments) 03/24/2013   Betadine [povidone iodine ] Itching 08/14/2022    Family History  Problem Relation Age of Onset   Diabetes Mother    Hypertension Sister    Heart disease Sister    Diabetes Sister    Arthritis Brother    Gout Brother    Colon cancer Neg Hx    Esophageal cancer Neg Hx    Rectal cancer Neg Hx    Stomach cancer Neg Hx    Colon polyps Neg Hx    Inflammatory bowel disease Neg Hx    Liver disease Neg Hx    Pancreatic cancer Neg Hx     Social History   Socioeconomic History   Marital status: Single    Spouse name: Not on file    Number of children: 0   Years of education: Not on file   Highest education level: Not on file  Occupational History   Not on file  Tobacco Use   Smoking status: Some Days    Current packs/day: 0.75    Average packs/day: 0.8 packs/day for 55.0 years (41.3 ttl pk-yrs)    Types: Cigarettes   Smokeless tobacco: Never   Tobacco comments:    Smokes 2 -3 cigarettes per day sometimes.  Vaping Use   Vaping status: Never Used  Substance and Sexual Activity   Alcohol use: Not Currently   Drug use: No   Sexual activity: Not on file  Other Topics Concern   Not on file  Social History Narrative   Patient is single reports no children   Smoker, no current alcohol or drug use   Social Drivers of Health   Tobacco Use: High Risk (07/12/2024)   Patient History    Smoking Tobacco Use: Some Days    Smokeless Tobacco Use: Never    Passive Exposure: Not on file  Financial Resource Strain: Low Risk (06/22/2024)   Overall Financial Resource Strain (CARDIA)    Difficulty of Paying Living Expenses: Not hard at all  Food Insecurity: No Food Insecurity (07/13/2024)   Epic    Worried About Radiation Protection Practitioner of Food in the Last Year: Never true    Ran Out of Food in the Last Year: Never true  Transportation Needs: No Transportation Needs (07/13/2024)  Epic    Lack of Transportation (Medical): No    Lack of Transportation (Non-Medical): No  Physical Activity: Inactive (06/22/2024)   Exercise Vital Sign    Days of Exercise per Week: 0 days    Minutes of Exercise per Session: 0 min  Stress: No Stress Concern Present (06/22/2024)   [name] of Occupational Health - Occupational Stress Questionnaire    Feeling of Stress: Not at all  Social Connections: Moderately Isolated (07/13/2024)   Social Connection and Isolation Panel    Frequency of Communication with Friends and Family: More than three times a week    Frequency of Social Gatherings with Friends and Family: Twice a week    Attends Religious  Services: More than 4 times per year    Active Member of Clubs or Organizations: No    Attends Banker Meetings: Never    Marital Status: Divorced  Catering Manager Violence: Not At Risk (07/13/2024)   Epic    Fear of Current or Ex-Partner: No    Emotionally Abused: No    Physically Abused: No    Sexually Abused: No  Depression (PHQ2-9): Low Risk (06/22/2024)   Depression (PHQ2-9)    PHQ-2 Score: 0  Alcohol Screen: Low Risk (06/22/2024)   Alcohol Screen    Last Alcohol Screening Score (AUDIT): 0  Housing: Low Risk (07/13/2024)   Epic    Unable to Pay for Housing in the Last Year: No    Number of Times Moved in the Last Year: 0    Homeless in the Last Year: No  Utilities: Not At Risk (07/13/2024)   Epic    Threatened with loss of utilities: No  Health Literacy: Adequate Health Literacy (06/22/2024)   B1300 Health Literacy    Frequency of need for help with medical instructions: Rarely    Review of Systems: Pertinent positive and negative review of systems were noted in the above HPI section.  All other review of systems was otherwise negative.   Physical Exam: Vital signs in last 24 hours: Temp:  [97.9 F (36.6 C)-99.4 F (37.4 C)] 97.9 F (36.6 C) (01/27 1113) Pulse Rate:  [54-112] 68 (01/27 1113) Resp:  [16-22] 20 (01/27 1113) BP: (120-143)/(56-82) 138/77 (01/27 1113) SpO2:  [90 %-100 %] 95 % (01/27 1113) Weight:  [61.3 kg] 61.3 kg (01/26 1624) Last BM Date : 07/13/24 General:   Alert,  Well-developed, thin elderly African-American male pleasant and cooperative in NAD, on O2 at 3 L Head:  Normocephalic and atraumatic. Eyes:  Sclera clear, no icterus.   Conjunctiva pink. Ears:  Normal auditory acuity. Nose:  No deformity, discharge,  or lesions. Mouth:  No deformity or lesions.   Neck:  Supple; no masses or thyromegaly. Lungs:  Clear throughout to auscultation.   No wheezes, crackles, or rhonchi.  Heart:  Regular rate and rhythm; no murmurs, clicks, rubs,  or  gallops. Abdomen:  Soft,nontender, BS active,nonpalp mass or hsm.   Rectal: Not done, stool reported tarry/black Msk:  Symmetrical without gross deformities. . Pulses:  Normal pulses noted. Extremities:  Without clubbing or edema. Neurologic:  Alert and  oriented x3;  grossly normal neurologically. Skin:  Intact without significant lesions or rashes.. Psych:  Alert and cooperative. Normal mood and affect.  Intake/Output from previous day: 01/26 0701 - 01/27 0700 In: 120 [P.O.:120] Out: 175 [Urine:175] Intake/Output this shift: Total I/O In: 732 [P.O.:360; Blood:372] Out: 425 [Urine:425]  Lab Results: Recent Labs    07/12/24 1901 07/13/24 0240 07/13/24  1051  WBC 7.8 6.5 6.9  HGB 8.0* 6.6* 8.6*  HCT 25.7* 21.3* 27.2*  PLT 100* 84* 88*   BMET Recent Labs    07/11/24 0524 07/11/24 0527 07/11/24 0528 07/12/24 0613 07/13/24 1051  NA 141 144 144 140 140  K 3.9 3.7 3.7 4.6 4.5  CL 107 108  --  109 107  CO2 21*  --   --  22 26  GLUCOSE 103* 102*  --  102* 96  BUN 21 22  --  29* 32*  CREATININE 1.50* 1.60*  --  1.58* 1.52*  CALCIUM  8.7*  --   --  8.6* 8.3*   LFT Recent Labs    07/12/24 0613  PROT 5.6*  ALBUMIN  3.2*  AST 33  ALT 32  ALKPHOS 65  BILITOT 0.4   PT/INR No results for input(s): LABPROT, INR in the last 72 hours. Hepatitis Panel No results for input(s): HEPBSAG, HCVAB, HEPAIGM, HEPBIGM in the last 72 hours.   IMPRESSION:  #52 79 year old male admitted with complaints of chest pain and shortness of breath is this is in the setting of chronic hypoxic respiratory failure on 3 L nasal cannula at home, history of congestive heart failure, and previous abdominal aortic aneurysm repair June 2025.  BNP significantly elevated consistent with component of acute CHF  CT angiography initially raised concern for endoleak at the site of his aortic aneurysm graft repair. CV surgery had been consulted and reviewed and do not feel that he has any active  endoleak and that the changes present on CT angiography have been present at least since November 2025.  #2 chronic antiplatelet therapy-on aspirin  and Plavix  at home-history of coronary artery disease status post previous stenting On hold since admission  #3 new onset melena over the past 2 days.  Also had significant drop in hemoglobin since admission from 10-6.8-transfused yesterday and last hemoglobin up to 8.  Patient had been on prophylactic Lovenox  discontinued last p.m.  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  This was supposed to have been addressed in 2024 with possible referral to [facility] for ampullectomy however patient did not follow-up .  Primary concern would be for enlargement of the ampullary adenoma with ulceration and/or malignant transformation.  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  Chronic kidney disease stage III #7 A-fib not anticoagulated   PLAN: IV PPI twice daily Full liquid diet today, n.p.o. after midnight Continue serial hemoglobins every 6-8 hours and transfuse as indicated We will plan for EGD/EUS with Dr. Wilhelmenia potentially tomorrow morning, as he seems fairly well compensated from a heart failure perspective since diuresis. He will have been off Plavix  for 4 days at that time-so we will be able to biopsy, but would not plan for ampullectomy.  He is also due for follow-up colonoscopy that can be pursued as an outpatient. GI will follow with you.   Amy EsterwoodPA-C  07/13/2024, 12:23 PM    "

## 2024-07-13 NOTE — Plan of Care (Signed)

## 2024-07-13 NOTE — Progress Notes (Signed)
 "  HD#0 SUBJECTIVE:  Patient Summary: [name] is a 79 y.o. 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   Overnight Events: Hgb dropped from 8.0 to 6.6, 1 unit pRBCs ordered. Night team not notified of any visualized blood.  Interim History: Patient reports that he has had dark stools yesterday and today. Otherwise, he is feeling well. Denies any chest pain or shortness of breath.  He denies any abdominal pain, nausea, or vomiting at this time.  Denies any hemoptysis or bleeding elsewhere.  Patient reports consistent adherence to her aspirin  and Plavix  when he was at home.  He does report chronic hip pain which has not changed since this admission. Wondering why he wasn't given his home pain medications.  OBJECTIVE:  Vital Signs: Vitals:   07/13/24 0359 07/13/24 0452 07/13/24 0514 07/13/24 0515  BP: 120/71 123/70 131/69 131/69  Pulse: 62 78 78   Resp: 19 18 18 18   Temp: 99.4 F (37.4 C) 99.4 F (37.4 C) 99.4 F (37.4 C) 99.4 F (37.4 C)  TempSrc: Oral Oral  Oral  SpO2: 100% 99%    Weight:      Height:        Filed Weights   07/11/24 0522 07/12/24 1624  Weight: 60 kg 61.3 kg     Intake/Output Summary (Last 24 hours) at 07/13/2024 0631 Last data filed at 07/13/2024 0401 Gross per 24 hour  Intake 120 ml  Output 175 ml  Net -55 ml   Net IO Since Admission: -55 mL [07/13/24 0631]  Physical Exam: Constitutional: chronically ill-appearing, cachectic elderly man in no acute distress HENT: normocephalic atraumatic, mucous membranes moist Eyes: conjunctiva non-erythematous, PERRL, no scleral icterus Cardiovascular: irregularly irregular rhythm with normal rate, no m/r/g Pulmonary/Chest: barrel-shaped chest; normal work of breathing on  home 3L , diminished breath sounds bilaterally; no wheezes/rales/rhonchi Abdominal: soft, non-tender, non-distended, bowel sounds normal MSK: normal bulk and tone Neurological: alert and oriented x3, able to pull himself up in bed, moving extremities equally Skin: warm and dry Extremities: no edema or cyanosis; 1+ DP pulses bilaterally, feet cooler to touch Psych: normal mood and affect, thought content normal  Patient Lines/Drains/Airways Status     Active Line/Drains/Airways     Name Placement date Placement time Site Days   Peripheral IV 07/11/24 20 G Left Antecubital 07/11/24  0524  Antecubital  2   Suprapubic Catheter Latex 18 Fr. 07/08/22  1058  Latex  736   Wound 05/26/24 1815 Pressure Injury Penis Posterior Stage 3 -  Full thickness tissue loss. Subcutaneous fat may be visible but bone, tendon or muscle are NOT exposed. 05/26/24  1815  Penis  48            Pertinent labs and imaging:     Latest Ref Rng & Units 07/13/2024    2:40 AM 07/12/2024    7:01 PM 07/12/2024    9:06 AM  CBC  WBC 4.0 - 10.5 K/uL 6.5  7.8  6.4   Hemoglobin 13.0 - 17.0 g/dL 6.6  8.0  7.8   Hematocrit 39.0 - 52.0 % 21.3  25.7  25.3   Platelets 150 - 400 K/uL 84  100  104        Latest Ref Rng & Units  07/12/2024    6:13 AM 07/11/2024    5:28 AM 07/11/2024    5:27 AM  CMP  Glucose 70 - 99 mg/dL 897   897   BUN 8 - 23 mg/dL 29   22   Creatinine 9.38 - 1.24 mg/dL 8.41   8.39   Sodium 864 - 145 mmol/L 140  144  144   Potassium 3.5 - 5.1 mmol/L 4.6  3.7  3.7   Chloride 98 - 111 mmol/L 109   108   CO2 22 - 32 mmol/L 22     Calcium  8.9 - 10.3 mg/dL 8.6     Total Protein 6.5 - 8.1 g/dL 5.6     Total Bilirubin 0.0 - 1.2 mg/dL 0.4     Alkaline Phos 38 - 126 U/L 65     AST 15 - 41 U/L 33     ALT 0 - 44 U/L 32       US  Abdomen Complete Result Date: 07/12/2024 CLINICAL DATA:  79462 Acute blood loss anemia [id number] EXAM: ABDOMEN ULTRASOUND COMPLETE COMPARISON:  US  Abdomen, 05/28/2024. CTA CAP,  07/11/2024. CT AP, 06/14/2024. FINDINGS: Suboptimal evaluation, with poor acoustic penetration secondary to patient habitus. Gallbladder: Dependent echogenic gallstones at the gallbladder neck. Nondistended gallbladder with POSITIVE wall thickening. No sonographic Murphy sign noted by sonographer. Common bile duct: Diameter: Not visualized. Liver: Increased hepatic parenchymal echogenicity. Portal vein is patent on color Doppler imaging with normal direction of blood flow towards the liver. IVC: No abnormality visualized. Pancreas: Not visualized. Spleen: Size and appearance within normal limits. Right Kidney: Length: 9.6 cm. Echogenicity within normal limits. Large, simple-appearing RIGHT renal cysts are present. No hydronephrosis visualized. Left Kidney: Length: 6.2 cm. Echogenicity within normal limits. No mass or hydronephrosis visualized. Abdominal aorta: Aortic stent graft is present. Other findings: No ascites. IMPRESSION: Suboptimal evaluation, within these constraints; 1. Nondistended gallbladder, however POSITIVE for gallstones and wall thickening. No sonographic Murphy's sign reported. Findings equivocal for acute calculus cholecystitis. Consider NM HIDA if increased clinical concern. 2. Multi-cystic RIGHT kidney. Electronically Signed   By: Thom Hall M.D.   On: 07/12/2024 13:03    ASSESSMENT/PLAN:  Assessment: Principal Problem:   Acute hypoxic respiratory failure (HCC) Active Problems:   Chest pain   Shortness of breath   [name] is a 79 y.o. 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  Plan: #Acute Blood Loss Anemia, suspected UGIB #Ampullary Adenoma #Thrombocytopenia Patient  reporting melena yesterday night and this morning.  Hemoglobin dropped below 7 and he was given a unit of blood this morning.  Hemodynamically stable, heart rate on the lower side, though he was receiving home beta-blocker.  Reticulocyte count inappropriately normal.  Ferritin normal.  Iron saturation ratio slightly decreased.  Folate and B12 within normal limits.  LDH normal.  Smear review did show schistocytes, there was question whether prior aortic graft repair could be causing shearing, though normal bilirubin and LDH excess less likely. 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  He was supposed to follow-up with outpatient GI after his aortic aneurysm was repaired, however he was  lost to follow-up.  Consulted GI, appreciate further recommendations.  We have transitioned home PPI to IV twice daily dosing. - GI consulted, appreciate further recommendations - IV Protonix  40 mg twice daily - Continue home ASA - SCDs for DVT PPx  #Acute on chronic hypoxic respiratory failure #COPD on 3L [place] at home #HFmrEF Patient no longer with dyspnea or chest pain that he initially presented with.  Back on home 3L [place] with normal saturation.  Exam showing normal work of breathing on his home oxygen.  Lungs with diminished breath sounds but no wheezes, rales, rhonchi.  Hypoxia may have been in the setting of anemia but did have rapid improvement.  Has HFrEF but appears euvolemic.  Do not think he needs diuresis at this time.  Doubt COPD exacerbation but will continue Brovana  and Incruse Ellipta  while admitted (takes Stiolto Respimat  at home which is not on formulary). - Hold Entresto , Spironolactone  d/t hypotension - Hold Toprol -XL d/t bradycardia - Brovana  Nebulizer 15 mcg bid + Incruse Ellipta     #Possible Aortic EndoLeak seen on CTA #Aortic Aneurysm s/p repair Initially reported chest pain, dyspnea, and diaphoresis which  seems to have improved since initial onset of symptoms. CT imaging findings with concern for Aortic Endoleak vs aortic dissection, stable AAA s/p repair by Dr. Serene on 12/02/23. Takes ASA and Plavix  as home medications. Discussed case with Dr. Pearline who reviewed patient's imaging. No concern for dissection or worsening AAA from vascular surgery perspective. He does have a follow up appointment on 07/26/24 which is appropriate at this time. No further recommendations from vascular surgery. - Continue home ASA - Continue home rosuvastatin    #Prior NSTEMI #Aortic atherosclerosis Initial complaints of chest pain, but none active at this time. Troponins initially elevated but low delta (70->76) and close to baseline values. EKG without concerning ST/T changes. Lower concern for ACS at this time but patient remains at high risk. Elevation in troponins could be in setting of CKD. Will continue to monitoring patient on telemetry. - Continuous cardiac monitoring - STAT EKG + troponins if new CP - Continue home ASA - Continue home rosuvastatin    #Persistent Afib not on AC Presented in atrial fibrillation with rates controlled.  CHA2DS2-VASc of 5 but not on anticoagulation d/t prior GI bleeding on Xarelto . Now with acute drop in Hgb. Has seen EP in 2022 who recommended rate control. Takes Toprol -XL 25 mg daily, will hold this as patient's heart rate has decreased down to the 40s.  - Continuous cardiac monitoring - Holding home Toprol -XL due to bradycardia   #Concern for Malnutrition #Cachexia Patient remains chronically ill-appearing with cachexia (BMI 17.9). Likely in the setting of known prostate adenocarcinoma + emphysema/COPD. Albumin  on admission of 3.9. - Ensure supplements bid b/w meals - Continue home mirtazapine  15 mg nightly   #CKD3 Appears to be at baseline Cr (~1.5-1.6). Received contrast as part of workup for possible dissection/acute aortic pathologies. Will monitor Cr for AKI and avoid  further nephrotoxic agents. - Continue to monitor   #HTN Labile BP ranging 100s-140s SBP. Home medications include amlodipine  10 mg, Toprol -XL 25 mg, Entresto  97-103 mg, and spironolactone  12.5 mg.  Have to hold home medications as pressures will not tolerate likely in the setting of GI bleed.  Also holding beta-blocker due to bradycardia. - Hold home Toprol -XL due to bradycardia - Hold home Entresto  d/t hypotension, may restart at half dose if pressures tolerate - Hold home amlodipine  and spironolactone    Chronic Stable Medical Conditions   #Chronic  Hip Pain - continue home Norco #TUD - continue nicotine  patches while admitted #GERD - increase Protonix  to 40 mg twice daily IV due to suspected GI bleed #HLD - continue home rosuvastatin  #Depression - continue home escitalopram   Best Practice: Diet: Heart-Healthy IVF: Fluids: None, Rate: None VTE: Place and maintain sequential compression device Start: 07/12/24 1131 Code: Full  Disposition planning: Therapy Recs: Pending, DME: other pending Family Contact: Sister, to be notified. DISPO: Anticipated discharge to Home pending potential procedure for suspected UGIB.  Signature:  Letha Cheadle, MD [facility]  PGY-1 07/13/2024, 6:31 AM  On Call pager [phone]  "

## 2024-07-13 NOTE — H&P (View-Only) (Signed)
 "   Consultation  Referring Provider: Medicine service/[facility] Primary Care Physician:  [name], DO Primary Gastroenterologist:  Dr. Avram  Reason for Consultation: Melena, anemia  HPI: [name] is a 79 y.o. 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  At that time not felt to be likely amenable to endoscopic resection due to location/positioning and patient's anatomy with a J-shaped stomach.  At the time of his last office visit in February 2024 he was waiting to undergo repair of the abdominal aortic aneurysm with stent graft and plan was to have him follow-up after that and discuss potential ampullectomy.  Referral was actually sent but the patient did not follow up in the office.  Currently admitted 2 days ago after he presented to the emergency room with complaints of chest pain and shortness of breath.  Initial workup in the emergency room on 07/11/2024 with CT angio chest abdomen and pelvis showed the native abdominal aortic aneurysm sac to be 6.2 cm, and then new since July 2025 was not enhancing endoleak or developing new aortic dissection at the level of the diaphragm located laterally on the left at the proximal endograft landing site.  This measured 17 mm in length and 16 mm in thickness.  There is chronic pulmonary artery and artery enlargement suggesting pulmonary artery hypertension, chronic COPD with right lung volume loss and enhancing atelectasis/prior right upper lobectomy there is capacious 10 cm air and enteric contents/stool containing viscus at the  anterior pelvic inlet indeterminate for giant diverticulum versus bowel anastomosis.  Vascular surgery was consulted, and do not feel that he has an acute endoleak, he is felt to have a sealed aneurysm throughout to the visceral segment without any flow into the aneurysm.  There may have been a focal dissection but this has not changed since November 2025.  BNP markedly elevated on admission at 8200 Troponins bland 1/25 2026 hemoglobin 10.9> 10.2> 6.8 Platelets 80,000 BUN 29/creatinine 1.58 LFTs within normal limits Ferritin 155/serum iron 49/TIBC 294/iron saturation 17  Transfused x 1 with hemoglobin up to 8/hematocrit 25.7 last p.m. pending this morning  Patient says he feels better since admission, has been diuresed with good response. Reports onset of black stools just since admission, is not aware of having any melena or hematochezia prior to admission.  He had been taking his baby aspirin  and Plavix  up until the day of admission  Been started on Lovenox  here which has been discontinued since onset of melena  Patient has no complaints of abdominal discomfort, no dysphagia or dyne aphasia, no issues with nausea or vomiting, no changes in bowel habits.  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  s/p stent to prox RCA and mid Cx.   Cataract    Cholelithiasis    Chronic osteomyelitis involving ankle and foot, right (HCC) 07/13/2018   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   2006 - 3 adenomas largest 12 mm  2008 - no polyps 2013-  2 mm cecal polyp, adenoma, routine repeat colonoscopy 02/2017 approx 05/14/2017 18 polyps max 12 mm ALL adenomas recall 2019 [name], MD, [facility]      Dieulafoy lesion of duodenum    Diverticulosis of colon    DJD (degenerative joint disease)    left hip   Full dentures    upper and lower   GERD (gastroesophageal reflux disease)    diet controlled    Gout    feet   Hiatal hernia    2 cm noted on upper endos 2011    Hx of adenomatous colonic polyps    Hyperlipidemia    Hypertension    Hypoxia 05/31/2024   Internal hemorrhoid    Lung cancer (HCC)    squamous cell ([id number]), 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 pt was on Plavix  x 3 years then transitioned to ASA, no problems since   Non-small cell carcinoma of right lung, stage 1 (HCC) 01/30/2016   Renal failure, acute    hx of 2/2 medication, resolved   Tobacco abuse    smoker .5 ppd x 55 yrs   UTI (lower urinary tract infection)     Past Surgical History:  Procedure Laterality Date   ABDOMINAL AORTIC ENDOVASCULAR FENESTRATED STENT GRAFT N/A 08/16/2022   Procedure: ABDOMINAL AORTIC ENDOVASCULAR FENESTRATED STENT GRAFT;  Surgeon: [name] ORN, MD;  Location: [facility] OR;  Service: Vascular;  Laterality: N/A;   ABDOMINAL AORTIC ENDOVASCULAR FENESTRATED STENT GRAFT N/A 12/02/2023   Procedure: ABDOMINAL AORTIC ENDOVASCULAR FENESTRATED STENT GRAFT WITH ULTRASOUND GUIDED ACCESS;  Surgeon: [name] ORN, MD;  Location: [facility] INVASIVE CV LAB;  Service: Cardiovascular;  Laterality: N/A;   BIOPSY  11/12/2020   Procedure: BIOPSY;  Surgeon: Legrand Victory LITTIE DOUGLAS, MD;  Location: [facility] ENDOSCOPY;  Service: Gastroenterology;;   BIOPSY  07/25/2021    Procedure: BIOPSY;  Surgeon: Stacia Glendia BRAVO, MD;  Location: [facility] ENDOSCOPY;  Service: Gastroenterology;;   BIOPSY  12/24/2021   Procedure: BIOPSY;  Surgeon: Wilhelmenia Aloha Raddle., MD;  Location: [facility] ENDOSCOPY;  Service: Gastroenterology;;   BRONCHIAL BIOPSY  07/27/2021   Procedure: BRONCHIAL BIOPSIES;  Surgeon: Shelah Lamar RAMAN, MD;  Location: [facility] ENDOSCOPY;  Service: Cardiopulmonary;;   BRONCHIAL BRUSHINGS  07/27/2021   Procedure: BRONCHIAL BRUSHINGS;  Surgeon: Shelah Lamar RAMAN, MD;  Location: [facility] ENDOSCOPY;  Service: Cardiopulmonary;;   cataract surgery Bilateral 2018   removal of cataracts   COLONOSCOPY     hx polyps - Avram 02/2012   COLONOSCOPY WITH PROPOFOL  N/A 11/12/2020   Procedure: COLONOSCOPY WITH PROPOFOL ;  Surgeon: Legrand Victory LITTIE DOUGLAS, MD;  Location: [facility] ENDOSCOPY;  Service: Gastroenterology;  Laterality: N/A;   CORONARY ANGIOPLASTY WITH STENT PLACEMENT     ENDOVASCULAR STENT INSERTION Bilateral 08/16/2022   Procedure: ENDOVASCULAR STENT GRAFT INSERTION OF BILATERAL RENAL ARTERIES;  Surgeon: [name] ORN, MD;  [facility]: [facility] OR;  Service: Vascular;  Laterality: Bilateral;   ESOPHAGOGASTRODUODENOSCOPY (EGD) WITH PROPOFOL  N/A 11/12/2020   Procedure: ESOPHAGOGASTRODUODENOSCOPY (EGD) WITH PROPOFOL ;  Surgeon: Legrand Victory LITTIE DOUGLAS, MD;  [facility]: [facility] ENDOSCOPY;  Service: Gastroenterology;  Laterality: N/A;   ESOPHAGOGASTRODUODENOSCOPY (EGD) WITH PROPOFOL  N/A 03/06/2021   Procedure: ESOPHAGOGASTRODUODENOSCOPY (EGD) WITH PROPOFOL ;  Surgeon: Leigh Elspeth SQUIBB, MD;  Location: [facility] ENDOSCOPY;  Service: Gastroenterology;  Laterality: N/A;   ESOPHAGOGASTRODUODENOSCOPY (EGD) WITH PROPOFOL  N/A 07/25/2021   Procedure: ESOPHAGOGASTRODUODENOSCOPY (EGD) WITH PROPOFOL ;  Surgeon: Stacia Glendia BRAVO, MD;  Location: [facility] ENDOSCOPY;  Service: Gastroenterology;  Laterality: N/A;   ESOPHAGOGASTRODUODENOSCOPY (EGD) WITH PROPOFOL  N/A 12/24/2021   Procedure: ESOPHAGOGASTRODUODENOSCOPY (EGD) WITH PROPOFOL ;  Surgeon:  Wilhelmenia Aloha Raddle., MD;  Location: [facility] ENDOSCOPY;  Service: Gastroenterology;  Laterality: N/A;   flexible cystourethroscopy  05/24/06   HEMOSTASIS CLIP PLACEMENT  03/06/2021   Procedure: HEMOSTASIS CLIP PLACEMENT;  Surgeon: Leigh Elspeth SQUIBB, MD;  Location: [facility] ENDOSCOPY;  Service: Gastroenterology;;   HEMOSTASIS CLIP PLACEMENT  12/24/2021   Procedure: HEMOSTASIS CLIP PLACEMENT;  Surgeon: Wilhelmenia Aloha Raddle., MD;  Location: [facility] ENDOSCOPY;  Service: Gastroenterology;;   HOT HEMOSTASIS N/A 03/06/2021   Procedure: HOT HEMOSTASIS (ARGON PLASMA COAGULATION/BICAP);  Surgeon: Leigh Elspeth SQUIBB, MD;  Location: [facility] ENDOSCOPY;  Service: Gastroenterology;  Laterality: N/A;   INGUINAL HERNIA REPAIR  2005   INSERTION OF ILIAC STENT Bilateral 08/16/2022   Procedure: INSERTION OF ILIAC STENT RIGHT EXTERNAL ARTERY, LEFT COMMON ARTERY, AND RIGHT COMMON ARTERY;  Surgeon: [name] ORN, MD;  [facility]: [facility] OR;  Service: Vascular;  Laterality: Bilateral;   IR CATHETER TUBE CHANGE  07/08/2022   LUNG LOBECTOMY Right 11/07   RUL   MULTIPLE TOOTH EXTRACTIONS     POLYPECTOMY  11/12/2020   Procedure: POLYPECTOMY;  Surgeon: Legrand Victory LITTIE DOUGLAS, MD;  Location: [facility] ENDOSCOPY;  Service: Gastroenterology;;   POLYPECTOMY  12/24/2021   Procedure: POLYPECTOMY;  Surgeon: Wilhelmenia Aloha Raddle., MD;  Location: [facility] ENDOSCOPY;  Service: Gastroenterology;;   SUBMUCOSAL LIFTING INJECTION  12/24/2021   Procedure: SUBMUCOSAL LIFTING INJECTION;  Surgeon: Wilhelmenia Aloha Raddle., MD;  Location: [facility] ENDOSCOPY;  Service: Gastroenterology;;   TRANSURETHRAL RESECTION OF PROSTATE N/A 05/02/2021   Procedure: TRANSURETHRAL RESECTION OF THE PROSTATE (TURP);  Surgeon: Carolee Sherwood JONETTA DOUGLAS, MD;  Location: [facility] ORS;  Service: Urology;  Laterality: N/A;   ULTRASOUND GUIDANCE FOR VASCULAR ACCESS Bilateral 08/16/2022   Procedure: ULTRASOUND GUIDANCE FOR VASCULAR ACCESS;  Surgeon: [name] ORN, MD;  [facility]: [facility] OR;  Service: Vascular;  Laterality:  Bilateral;   UPPER ESOPHAGEAL ENDOSCOPIC ULTRASOUND (EUS) N/A 12/24/2021   Procedure: UPPER ESOPHAGEAL ENDOSCOPIC ULTRASOUND (EUS);  Surgeon: Wilhelmenia Aloha Raddle., MD;  Location: [facility]OSCOPY;  Service: Gastroenterology;  Laterality: N/A;   UPPER GASTROINTESTINAL ENDOSCOPY     VIDEO BRONCHOSCOPY  07/27/2021   Procedure: VIDEO BRONCHOSCOPY WITHOUT FLUORO;  Surgeon: Shelah Lamar RAMAN, MD;  Location: [facility] ENDOSCOPY;  Service: Cardiopulmonary;;   VIDEO BRONCHOSCOPY Right 05/14/2024   Procedure: VIDEO BRONCHOSCOPY WITHOUT FLUORO;  Surgeon: Jude Harden GAILS, MD;  Location: [facility] ENDOSCOPY;  Service: Cardiopulmonary;  Laterality: Right;    Prior to Admission medications  Medication Sig Start Date End Date Taking? Authorizing Provider  albuterol  (VENTOLIN  HFA) 108 (90 Base) MCG/ACT inhaler Inhale 1-2 puffs into the lungs every 6 (six) hours as needed for wheezing or shortness of breath. 12/23/23  Yes [name], DO  amLODipine  (NORVASC ) 10 MG tablet Take 1 tablet (10 mg total) by mouth daily. 12/09/23 12/08/24 Yes Rosan Dayton  C, DO  aspirin  EC 81 MG tablet Take 1 tablet (81 mg total) by mouth daily. Swallow whole. 04/14/23  Yes [name] ORN, MD  clopidogrel  (PLAVIX ) 75 MG tablet Take 1 tablet (75 mg total) by mouth daily. 12/11/23  Yes Bethanie Cough, PA-C  escitalopram  (LEXAPRO ) 5 MG tablet Take 1 tablet (5 mg total) by mouth daily. 07/08/24 01/04/25 Yes [name], DO  HYDROcodone -acetaminophen  (NORCO) 7.5-325 MG tablet Take 1 tablet by mouth 2 (two) times daily as needed for moderate pain (pain score 4-6). Fill 30 days after last fill. 06/24/24  Yes [name], DO  metoprolol  succinate (TOPROL -XL) 25 MG 24 hr tablet Take 1 tablet (25 mg total) by mouth daily. 06/24/24 06/24/25 Yes [name], DO  mirtazapine  (REMERON ) 7.5 MG tablet Take 2 tablets (15 mg total) by mouth at bedtime. 03/24/24  Yes [name], DO  Pancrelipase , Lip-Prot-Amyl, (ZENPEP ) 20000-63000 units CPEP Take 3 capsules by  mouth 3 (three) times daily before meals. 07/05/24  Yes [name], DO  pantoprazole  (PROTONIX ) 40 MG tablet TAKE 1 TABLET EVERY DAY FOR REFLUX 12/09/23  Yes [name], DO  predniSONE  (DELTASONE ) 50 MG tablet Take one tablet 13 hours, 7 hours, and 1 hour prior to scan. 06/23/24  Yes [name] ORN, MD  rosuvastatin  (CRESTOR ) 40 MG tablet Take 1 tablet (40 mg total) by mouth daily. 04/03/23  Yes Jerrell Cleatus Ned, MD  sacubitril -valsartan  (ENTRESTO ) 97-103 MG Take 1 tablet by mouth 2 (two) times daily. 04/19/24  Yes [name], DO  spironolactone  (ALDACTONE ) 25 MG tablet Take 0.5 tablets (12.5 mg total) by mouth daily. 06/07/24 09/05/24 Yes [name], Callie E, PA-C  Tiotropium Bromide-Olodaterol (STIOLTO RESPIMAT ) 2.5-2.5 MCG/ACT AERS Inhale 2 puffs into the lungs daily. 02/27/24  Yes Hattar, Zola SAILOR, MD  diphenhydrAMINE  (BENADRYL ) 50 MG capsule Take one capsule 1 hour prior to scan. Patient not taking: Reported on 07/11/2024 06/23/24   [name] ORN, MD  furosemide  (LASIX ) 40 MG tablet Take 1 tablet (40 mg total) by mouth daily. Patient not taking: Reported on 07/11/2024 05/29/24   Harrie Bruckner, DO  OXYGEN Inhale 3 L/min into the lungs continuous.    [provider]    Current Facility-Administered Medications  Medication Dose Route Frequency Provider Last Rate Last Admin   0.9 %  sodium chloride  infusion (Manually program via Guardrails IV Fluids)   Intravenous Once Marylu Gee, DO       acetaminophen  (TYLENOL ) tablet 650 mg  650 mg Oral Q6H PRN Celestina Czar, MD       Or   acetaminophen  (TYLENOL ) suppository 650 mg  650 mg Rectal Q6H PRN Celestina Czar, MD       albuterol  (PROVENTIL ) (2.5 MG/3ML) 0.083% nebulizer solution 2.5 mg  2.5 mg Inhalation Q6H PRN Waymond Cart, MD       arformoterol  (BROVANA ) nebulizer solution 15 mcg  15 mcg Nebulization BID Tan, Dawson, MD   15 mcg at 07/13/24 1008   And   umeclidinium bromide  (INCRUSE ELLIPTA ) 62.5 MCG/ACT 1 puff   1 puff Inhalation Daily Waymond Cart, MD   1 puff at 07/11/24 1232   aspirin  EC tablet 81 mg  81 mg Oral Daily Tan, Dawson, MD   81 mg at 07/13/24 1009   escitalopram  (LEXAPRO ) tablet 5 mg  5 mg Oral Daily Tan, Dawson, MD   5 mg at 07/13/24 1009   feeding supplement (ENSURE PLUS HIGH PROTEIN) liquid 237 mL  237 mL Oral  BID BM Tan, Dawson, MD   237 mL at 07/13/24 1009   HYDROcodone -acetaminophen  (NORCO) 7.5-325 MG per tablet 1 tablet  1 tablet Oral BID PRN Tan, Dawson, MD   1 tablet at 07/13/24 1012   lipase/protease/amylase (CREON ) capsule 60,000 Units  60,000 Units Oral TID AC Tan, Dawson, MD   60,000 Units at 07/13/24 1010   mirtazapine  (REMERON ) tablet 15 mg  15 mg Oral QHS Waymond Cart, MD   15 mg at 07/11/24 2155   nicotine  (NICODERM CQ  - dosed in mg/24 hr) patch 7 mg  7 mg Transdermal Daily Tan, Dawson, MD   7 mg at 07/13/24 1011   pantoprazole  (PROTONIX ) injection 40 mg  40 mg Intravenous BID Faunce, Alina, DO   40 mg at 07/13/24 1011   polyethylene glycol (MIRALAX  / GLYCOLAX ) packet 17 g  17 g Oral Daily Waymond Cart, MD       rosuvastatin  (CRESTOR ) tablet 40 mg  40 mg Oral Daily Tan, Dawson, MD   40 mg at 07/13/24 1011    Allergies as of 07/11/2024 - Review Complete 07/11/2024  Allergen Reaction Noted   Candesartan cilexetil-hctz Other (See Comments)    Hydrochlorothiazide Other (See Comments)    Shellfish allergy Other (See Comments) 03/24/2013   Betadine [povidone iodine ] Itching 08/14/2022    Family History  Problem Relation Age of Onset   Diabetes Mother    Hypertension Sister    Heart disease Sister    Diabetes Sister    Arthritis Brother    Gout Brother    Colon cancer Neg Hx    Esophageal cancer Neg Hx    Rectal cancer Neg Hx    Stomach cancer Neg Hx    Colon polyps Neg Hx    Inflammatory bowel disease Neg Hx    Liver disease Neg Hx    Pancreatic cancer Neg Hx     Social History   Socioeconomic History   Marital status: Single    Spouse name: Not on file    Number of children: 0   Years of education: Not on file   Highest education level: Not on file  Occupational History   Not on file  Tobacco Use   Smoking status: Some Days    Current packs/day: 0.75    Average packs/day: 0.8 packs/day for 55.0 years (41.3 ttl pk-yrs)    Types: Cigarettes   Smokeless tobacco: Never   Tobacco comments:    Smokes 2 -3 cigarettes per day sometimes.  Vaping Use   Vaping status: Never Used  Substance and Sexual Activity   Alcohol use: Not Currently   Drug use: No   Sexual activity: Not on file  Other Topics Concern   Not on file  Social History Narrative   Patient is single reports no children   Smoker, no current alcohol or drug use   Social Drivers of Health   Tobacco Use: High Risk (07/12/2024)   Patient History    Smoking Tobacco Use: Some Days    Smokeless Tobacco Use: Never    Passive Exposure: Not on file  Financial Resource Strain: Low Risk (06/22/2024)   Overall Financial Resource Strain (CARDIA)    Difficulty of Paying Living Expenses: Not hard at all  Food Insecurity: No Food Insecurity (07/13/2024)   Epic    Worried About Radiation Protection Practitioner of Food in the Last Year: Never true    Ran Out of Food in the Last Year: Never true  Transportation Needs: No Transportation Needs (07/13/2024)  Epic    Lack of Transportation (Medical): No    Lack of Transportation (Non-Medical): No  Physical Activity: Inactive (06/22/2024)   Exercise Vital Sign    Days of Exercise per Week: 0 days    Minutes of Exercise per Session: 0 min  Stress: No Stress Concern Present (06/22/2024)   [employer] of Occupational Health - Occupational Stress Questionnaire    Feeling of Stress: Not at all  Social Connections: Moderately Isolated (07/13/2024)   Social Connection and Isolation Panel    Frequency of Communication with Friends and Family: More than three times a week    Frequency of Social Gatherings with Friends and Family: Twice a week    Attends Religious  Services: More than 4 times per year    Active Member of Clubs or Organizations: No    Attends Banker Meetings: Never    Marital Status: Divorced  Catering Manager Violence: Not At Risk (07/13/2024)   Epic    Fear of Current or Ex-Partner: No    Emotionally Abused: No    Physically Abused: No    Sexually Abused: No  Depression (PHQ2-9): Low Risk (06/22/2024)   Depression (PHQ2-9)    PHQ-2 Score: 0  Alcohol Screen: Low Risk (06/22/2024)   Alcohol Screen    Last Alcohol Screening Score (AUDIT): 0  Housing: Low Risk (07/13/2024)   Epic    Unable to Pay for Housing in the Last Year: No    Number of Times Moved in the Last Year: 0    Homeless in the Last Year: No  Utilities: Not At Risk (07/13/2024)   Epic    Threatened with loss of utilities: No  Health Literacy: Adequate Health Literacy (06/22/2024)   B1300 Health Literacy    Frequency of need for help with medical instructions: Rarely    Review of Systems: Pertinent positive and negative review of systems were noted in the above HPI section.  All other review of systems was otherwise negative.   Physical Exam: Vital signs in last 24 hours: Temp:  [97.9 F (36.6 C)-99.4 F (37.4 C)] 97.9 F (36.6 C) (01/27 1113) Pulse Rate:  [54-112] 68 (01/27 1113) Resp:  [16-22] 20 (01/27 1113) BP: (120-143)/(56-82) 138/77 (01/27 1113) SpO2:  [90 %-100 %] 95 % (01/27 1113) Weight:  [61.3 kg] 61.3 kg (01/26 1624) Last BM Date : 07/13/24 General:   Alert,  Well-developed, thin elderly African-American male pleasant and cooperative in NAD, on O2 at 3 L Head:  Normocephalic and atraumatic. Eyes:  Sclera clear, no icterus.   Conjunctiva pink. Ears:  Normal auditory acuity. Nose:  No deformity, discharge,  or lesions. Mouth:  No deformity or lesions.   Neck:  Supple; no masses or thyromegaly. Lungs:  Clear throughout to auscultation.   No wheezes, crackles, or rhonchi.  Heart:  Regular rate and rhythm; no murmurs, clicks, rubs,  or  gallops. Abdomen:  Soft,nontender, BS active,nonpalp mass or hsm.   Rectal: Not done, stool reported tarry/black Msk:  Symmetrical without gross deformities. . Pulses:  Normal pulses noted. Extremities:  Without clubbing or edema. Neurologic:  Alert and  oriented x3;  grossly normal neurologically. Skin:  Intact without significant lesions or rashes.. Psych:  Alert and cooperative. Normal mood and affect.  Intake/Output from previous day: 01/26 0701 - 01/27 0700 In: 120 [P.O.:120] Out: 175 [Urine:175] Intake/Output this shift: Total I/O In: 732 [P.O.:360; Blood:372] Out: 425 [Urine:425]  Lab Results: Recent Labs    07/12/24 1901 07/13/24 0240 07/13/24  1051  WBC 7.8 6.5 6.9  HGB 8.0* 6.6* 8.6*  HCT 25.7* 21.3* 27.2*  PLT 100* 84* 88*   BMET Recent Labs    07/11/24 0524 07/11/24 0527 07/11/24 0528 07/12/24 0613 07/13/24 1051  NA 141 144 144 140 140  K 3.9 3.7 3.7 4.6 4.5  CL 107 108  --  109 107  CO2 21*  --   --  22 26  GLUCOSE 103* 102*  --  102* 96  BUN 21 22  --  29* 32*  CREATININE 1.50* 1.60*  --  1.58* 1.52*  CALCIUM  8.7*  --   --  8.6* 8.3*   LFT Recent Labs    07/12/24 0613  PROT 5.6*  ALBUMIN  3.2*  AST 33  ALT 32  ALKPHOS 65  BILITOT 0.4   PT/INR No results for input(s): LABPROT, INR in the last 72 hours. Hepatitis Panel No results for input(s): HEPBSAG, HCVAB, HEPAIGM, HEPBIGM in the last 72 hours.   IMPRESSION:  #90 79 year old male admitted with complaints of chest pain and shortness of breath is this is in the setting of chronic hypoxic respiratory failure on 3 L nasal cannula at home, history of congestive heart failure, and previous abdominal aortic aneurysm repair June 2025.  BNP significantly elevated consistent with component of acute CHF  CT angiography initially raised concern for endoleak at the site of his aortic aneurysm graft repair. CV surgery had been consulted and reviewed and do not feel that he has any active  endoleak and that the changes present on CT angiography have been present at least since November 2025.  #2 chronic antiplatelet therapy-on aspirin  and Plavix  at home-history of coronary artery disease status post previous stenting On hold since admission  #3 new onset melena over the past 2 days.  Also had significant drop in hemoglobin since admission from 10-6.8-transfused yesterday and last hemoglobin up to 8.  Patient had been on prophylactic Lovenox  discontinued last p.m.  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  This was supposed to have been addressed in 2024 with possible referral to [facility] for ampullectomy however patient did not follow-up .  Primary concern would be for enlargement of the ampullary adenoma with ulceration and/or malignant transformation.  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  Chronic kidney disease stage III #7 A-fib not anticoagulated   PLAN: IV PPI twice daily Full liquid diet today, n.p.o. after midnight Continue serial hemoglobins every 6-8 hours and transfuse as indicated We will plan for EGD/EUS with Dr. Wilhelmenia potentially tomorrow morning, as he seems fairly well compensated from a heart failure perspective since diuresis. He will have been off Plavix  for 4 days at that time-so we will be able to biopsy, but would not plan for ampullectomy.  He is also due for follow-up colonoscopy that can be pursued as an outpatient. GI will follow with you.   Amy EsterwoodPA-C  07/13/2024, 12:23 PM      Attending Physician's Attestation   I have taken an interval history, reviewed the chart and examined the patient.   This is a  patient of the GI service is asked to evaluate in the setting of anemia and melena and shortness of breath on antiplatelet therapy with known ampullary adenoma  lost to follow-up over the last 2 years.  Patient with known multiple medical comorbidities eluding recently worked on vascular complications requiring vascular surgery with potential aneurysm that is sealed after focal dissection.  Previously noted to have ampullary adenoma in 2023 with plan to have referral to [facility] for an attempt at resection (though felt less likely to be helpful) and patient declined/deferred on this referral.  He never followed up with Dr. Avram.  He denies any issues with pancreatitis.  No liver biochemical testing abnormalities at this time.  Imaging not showing any evidence of biliary or pancreatic ductal dilation.  Etiology of patient's current symptoms are not clear.  He is overdue for colonoscopy but in the setting of his presentation and known ampullary adenoma, we should begin with an upper endoscopy and possible endoscopic ultrasound to reevaluate the known adenoma to make sure that nothing has developed like a malignancy.  We will consider an upper endoscopic ultrasound and endoscopy tomorrow pending patient's clinical history which will allow another day of Plavix  washout.  The risks of an EUS including intestinal perforation, bleeding, infection, aspiration, and medication effects were discussed as was the possibility it may not give a definitive diagnosis if a biopsy is performed.  When a biopsy of the pancreas is done as part of the EUS, there is an additional risk of pancreatitis at the rate of about 1-2%.  It was explained that procedure related pancreatitis is typically mild, although it can be severe and even life threatening, which is why we do not perform random pancreatic biopsies and only biopsy a lesion/area we feel is concerning enough to warrant the risk.  The risks and benefits of endoscopic  evaluation/treatment were discussed with the patient and/or family; these include but are not limited to the risk of perforation, infection, bleeding, missed lesions, lack of diagnosis, severe illness requiring hospitalization, as well as anesthesia and sedation related illnesses.  The patient's history has been reviewed, patient examined, no change in status, and deemed stable for procedure.  The patient and/or family was provided an opportunity to ask questions and all were answered.  The patient and/or family is agreeable to proceed tentatively but will reconsider and discuss again tomorrow.  He worries about the risks of pancreatitis.  I agree with the Advanced Practitioner's note, impression, and recommendations with updates and my documentation as noted above.  The majority of the medical decision making/process, formulation of the impression/plan of action for the patient were performed by me with substantive portion of this encounter (>50% time spent including complete performance of at least one of the key components of MDM, History, and/or Exam).   Aloha Finner, MD [facility] Office # [phone]  "

## 2024-07-14 ENCOUNTER — Encounter (HOSPITAL_COMMUNITY): Payer: Self-pay | Admitting: Internal Medicine

## 2024-07-14 ENCOUNTER — Encounter (HOSPITAL_COMMUNITY): Admission: EM | Payer: Self-pay | Source: Home / Self Care | Attending: Internal Medicine

## 2024-07-14 ENCOUNTER — Inpatient Hospital Stay (HOSPITAL_COMMUNITY): Admitting: Anesthesiology

## 2024-07-14 DIAGNOSIS — N183 Chronic kidney disease, stage 3 unspecified: Secondary | ICD-10-CM | POA: Diagnosis not present

## 2024-07-14 DIAGNOSIS — K921 Melena: Secondary | ICD-10-CM

## 2024-07-14 DIAGNOSIS — K838 Other specified diseases of biliary tract: Secondary | ICD-10-CM

## 2024-07-14 DIAGNOSIS — D62 Acute posthemorrhagic anemia: Secondary | ICD-10-CM | POA: Diagnosis not present

## 2024-07-14 DIAGNOSIS — I110 Hypertensive heart disease with heart failure: Secondary | ICD-10-CM | POA: Diagnosis not present

## 2024-07-14 DIAGNOSIS — K2289 Other specified disease of esophagus: Secondary | ICD-10-CM

## 2024-07-14 DIAGNOSIS — D135 Benign neoplasm of extrahepatic bile ducts: Secondary | ICD-10-CM

## 2024-07-14 DIAGNOSIS — I899 Noninfective disorder of lymphatic vessels and lymph nodes, unspecified: Secondary | ICD-10-CM

## 2024-07-14 DIAGNOSIS — I5020 Unspecified systolic (congestive) heart failure: Secondary | ICD-10-CM | POA: Diagnosis not present

## 2024-07-14 DIAGNOSIS — I509 Heart failure, unspecified: Secondary | ICD-10-CM

## 2024-07-14 DIAGNOSIS — K219 Gastro-esophageal reflux disease without esophagitis: Secondary | ICD-10-CM | POA: Diagnosis not present

## 2024-07-14 DIAGNOSIS — I252 Old myocardial infarction: Secondary | ICD-10-CM | POA: Diagnosis not present

## 2024-07-14 DIAGNOSIS — K8689 Other specified diseases of pancreas: Secondary | ICD-10-CM

## 2024-07-14 DIAGNOSIS — J449 Chronic obstructive pulmonary disease, unspecified: Secondary | ICD-10-CM | POA: Diagnosis not present

## 2024-07-14 DIAGNOSIS — I719 Aortic aneurysm of unspecified site, without rupture: Secondary | ICD-10-CM | POA: Diagnosis not present

## 2024-07-14 DIAGNOSIS — K8020 Calculus of gallbladder without cholecystitis without obstruction: Secondary | ICD-10-CM

## 2024-07-14 DIAGNOSIS — I2510 Atherosclerotic heart disease of native coronary artery without angina pectoris: Secondary | ICD-10-CM | POA: Diagnosis not present

## 2024-07-14 DIAGNOSIS — I4819 Other persistent atrial fibrillation: Secondary | ICD-10-CM | POA: Diagnosis not present

## 2024-07-14 DIAGNOSIS — K2970 Gastritis, unspecified, without bleeding: Secondary | ICD-10-CM

## 2024-07-14 DIAGNOSIS — I130 Hypertensive heart and chronic kidney disease with heart failure and stage 1 through stage 4 chronic kidney disease, or unspecified chronic kidney disease: Secondary | ICD-10-CM | POA: Diagnosis not present

## 2024-07-14 DIAGNOSIS — K2960 Other gastritis without bleeding: Secondary | ICD-10-CM

## 2024-07-14 DIAGNOSIS — J9621 Acute and chronic respiratory failure with hypoxia: Secondary | ICD-10-CM | POA: Diagnosis not present

## 2024-07-14 DIAGNOSIS — D696 Thrombocytopenia, unspecified: Secondary | ICD-10-CM | POA: Diagnosis not present

## 2024-07-14 LAB — CBC WITH DIFFERENTIAL/PLATELET
Abs Immature Granulocytes: 0.03 10*3/uL (ref 0.00–0.07)
Abs Immature Granulocytes: 0.02 10*3/uL (ref 0.00–0.07)
Abs Immature Granulocytes: 0.01 10*3/uL (ref 0.00–0.07)
Abs Immature Granulocytes: 0.02 10*3/uL (ref 0.00–0.07)
Basophils Absolute: 0 10*3/uL (ref 0.0–0.1)
Basophils Absolute: 0 10*3/uL (ref 0.0–0.1)
Basophils Absolute: 0 10*3/uL (ref 0.0–0.1)
Basophils Absolute: 0 10*3/uL (ref 0.0–0.1)
Basophils Relative: 0 %
Basophils Relative: 0 %
Basophils Relative: 0 %
Basophils Relative: 0 %
Eosinophils Absolute: 0 10*3/uL (ref 0.0–0.5)
Eosinophils Absolute: 0.1 10*3/uL (ref 0.0–0.5)
Eosinophils Absolute: 0.1 10*3/uL (ref 0.0–0.5)
Eosinophils Absolute: 0 10*3/uL (ref 0.0–0.5)
Eosinophils Relative: 1 %
Eosinophils Relative: 1 %
Eosinophils Relative: 1 %
Eosinophils Relative: 1 %
HCT: 25.4 % — ABNORMAL LOW (ref 39.0–52.0)
HCT: 28.1 % — ABNORMAL LOW (ref 39.0–52.0)
HCT: 28.6 % — ABNORMAL LOW (ref 39.0–52.0)
HCT: 25.3 % — ABNORMAL LOW (ref 39.0–52.0)
Hemoglobin: 8.1 g/dL — ABNORMAL LOW (ref 13.0–17.0)
Hemoglobin: 8.9 g/dL — ABNORMAL LOW (ref 13.0–17.0)
Hemoglobin: 8.9 g/dL — ABNORMAL LOW (ref 13.0–17.0)
Hemoglobin: 7.9 g/dL — ABNORMAL LOW (ref 13.0–17.0)
Immature Granulocytes: 1 %
Immature Granulocytes: 0 %
Immature Granulocytes: 0 %
Immature Granulocytes: 0 %
Lymphocytes Relative: 16 %
Lymphocytes Relative: 23 %
Lymphocytes Relative: 13 %
Lymphocytes Relative: 12 %
Lymphs Abs: 1 10*3/uL (ref 0.7–4.0)
Lymphs Abs: 1.4 10*3/uL (ref 0.7–4.0)
Lymphs Abs: 0.7 10*3/uL (ref 0.7–4.0)
Lymphs Abs: 0.7 10*3/uL (ref 0.7–4.0)
MCH: 30.8 pg (ref 26.0–34.0)
MCH: 30.5 pg (ref 26.0–34.0)
MCH: 30.5 pg (ref 26.0–34.0)
MCH: 30.3 pg (ref 26.0–34.0)
MCHC: 31.9 g/dL (ref 30.0–36.0)
MCHC: 31.7 g/dL (ref 30.0–36.0)
MCHC: 31.1 g/dL (ref 30.0–36.0)
MCHC: 31.2 g/dL (ref 30.0–36.0)
MCV: 96.6 fL (ref 80.0–100.0)
MCV: 96.2 fL (ref 80.0–100.0)
MCV: 97.9 fL (ref 80.0–100.0)
MCV: 96.9 fL (ref 80.0–100.0)
Monocytes Absolute: 0.4 10*3/uL (ref 0.1–1.0)
Monocytes Absolute: 0.5 10*3/uL (ref 0.1–1.0)
Monocytes Absolute: 0.4 10*3/uL (ref 0.1–1.0)
Monocytes Absolute: 0.5 10*3/uL (ref 0.1–1.0)
Monocytes Relative: 7 %
Monocytes Relative: 8 %
Monocytes Relative: 6 %
Monocytes Relative: 8 %
Neutro Abs: 4.7 10*3/uL (ref 1.7–7.7)
Neutro Abs: 4.1 10*3/uL (ref 1.7–7.7)
Neutro Abs: 4.6 10*3/uL (ref 1.7–7.7)
Neutro Abs: 4.9 10*3/uL (ref 1.7–7.7)
Neutrophils Relative %: 75 %
Neutrophils Relative %: 68 %
Neutrophils Relative %: 80 %
Neutrophils Relative %: 79 %
Platelets: 78 10*3/uL — ABNORMAL LOW (ref 150–400)
Platelets: 77 10*3/uL — ABNORMAL LOW (ref 150–400)
Platelets: 78 10*3/uL — ABNORMAL LOW (ref 150–400)
Platelets: 74 10*3/uL — ABNORMAL LOW (ref 150–400)
RBC: 2.63 MIL/uL — ABNORMAL LOW (ref 4.22–5.81)
RBC: 2.92 MIL/uL — ABNORMAL LOW (ref 4.22–5.81)
RBC: 2.92 MIL/uL — ABNORMAL LOW (ref 4.22–5.81)
RBC: 2.61 MIL/uL — ABNORMAL LOW (ref 4.22–5.81)
RDW: 19.1 % — ABNORMAL HIGH (ref 11.5–15.5)
RDW: 18.7 % — ABNORMAL HIGH (ref 11.5–15.5)
RDW: 18.3 % — ABNORMAL HIGH (ref 11.5–15.5)
RDW: 18 % — ABNORMAL HIGH (ref 11.5–15.5)
WBC: 6.2 10*3/uL (ref 4.0–10.5)
WBC: 6.1 10*3/uL (ref 4.0–10.5)
WBC: 5.7 10*3/uL (ref 4.0–10.5)
WBC: 6.2 10*3/uL (ref 4.0–10.5)
nRBC: 0 % (ref 0.0–0.2)
nRBC: 0 % (ref 0.0–0.2)
nRBC: 0 % (ref 0.0–0.2)
nRBC: 0 % (ref 0.0–0.2)

## 2024-07-14 LAB — BASIC METABOLIC PANEL WITH GFR
Anion gap: 8 (ref 5–15)
BUN: 29 mg/dL — ABNORMAL HIGH (ref 8–23)
CO2: 27 mmol/L (ref 22–32)
Calcium: 8.9 mg/dL (ref 8.9–10.3)
Chloride: 106 mmol/L (ref 98–111)
Creatinine, Ser: 1.49 mg/dL — ABNORMAL HIGH (ref 0.61–1.24)
GFR, Estimated: 48 mL/min — ABNORMAL LOW
Glucose, Bld: 81 mg/dL (ref 70–99)
Potassium: 4.5 mmol/L (ref 3.5–5.1)
Sodium: 141 mmol/L (ref 135–145)

## 2024-07-14 MED ORDER — PANTOPRAZOLE SODIUM 40 MG IV SOLR
40.0000 mg | Freq: Two times a day (BID) | INTRAVENOUS | Status: DC
  Administered 2024-07-14: 40 mg via INTRAVENOUS
  Filled 2024-07-14: qty 10

## 2024-07-14 MED ORDER — PROPOFOL 10 MG/ML IV BOLUS
INTRAVENOUS | Status: DC | PRN
  Administered 2024-07-14: 10 mg via INTRAVENOUS

## 2024-07-14 MED ORDER — LIDOCAINE 2% (20 MG/ML) 5 ML SYRINGE
INTRAMUSCULAR | Status: DC | PRN
  Administered 2024-07-14: 60 mg via INTRAVENOUS

## 2024-07-14 MED ORDER — LACTATED RINGERS IV SOLN: INTRAVENOUS | Status: DC | PRN

## 2024-07-14 MED ORDER — PHENYLEPHRINE HCL (PRESSORS) 10 MG/ML IV SOLN
INTRAVENOUS | Status: DC | PRN
  Administered 2024-07-14 (×2): 80 ug via INTRAVENOUS

## 2024-07-14 MED ORDER — PROPOFOL 500 MG/50ML IV EMUL
INTRAVENOUS | Status: DC | PRN
  Administered 2024-07-14: 50 ug/kg/min via INTRAVENOUS

## 2024-07-14 MED ORDER — SUCRALFATE 1 G PO TABS
1.0000 g | ORAL_TABLET | Freq: Two times a day (BID) | ORAL | Status: DC
  Administered 2024-07-14 – 2024-07-19 (×11): 1 g via ORAL
  Filled 2024-07-14 (×11): qty 1

## 2024-07-14 MED ORDER — PHENYLEPHRINE HCL-NACL 20-0.9 MG/250ML-% IV SOLN
INTRAVENOUS | Status: DC | PRN
  Administered 2024-07-14: 30 ug/min via INTRAVENOUS

## 2024-07-14 NOTE — Plan of Care (Signed)
  Problem: Clinical Measurements: Goal: Ability to maintain clinical measurements within normal limits will improve Outcome: Progressing   Problem: Nutrition: Goal: Adequate nutrition will be maintained Outcome: Progressing   Problem: Pain Managment: Goal: General experience of comfort will improve and/or be controlled Outcome: Progressing

## 2024-07-14 NOTE — Transfer of Care (Signed)
 Immediate Anesthesia Transfer of Care Note  Patient: Jonette LITTIE Her  Procedure(s) Performed: ULTRASOUND, UPPER GI TRACT, ENDOSCOPIC EGD (ESOPHAGOGASTRODUODENOSCOPY) BIOPSY, SKIN, SUBCUTANEOUS TISSUE, OR MUCOUS MEMBRANE  Patient Location: Endoscopy Unit  Anesthesia Type:MAC  Level of Consciousness: awake, alert , and oriented  Airway & Oxygen Therapy: Patient Spontanous Breathing and Patient connected to nasal cannula oxygen  Post-op Assessment: Report given to RN and Post -op Vital signs reviewed and stable  Post vital signs: Reviewed and stable   Last Vitals:  Vitals Value Taken Time  BP 121/57 07/14/24 14:20  Temp    Pulse 67 07/14/24 14:20  Resp 21 07/14/24 14:20  SpO2 99 % 07/14/24 14:20  Vitals shown include unfiled device data.  Last Pain:  Vitals:   07/14/24 1410  TempSrc: Temporal  PainSc: 0-No pain         Complications: No notable events documented.

## 2024-07-14 NOTE — Interval H&P Note (Signed)
 History and Physical Interval Note:  07/14/2024 11:46 AM  [name] Her  has presented today for surgery, with the diagnosis of Melena, anemia, history of ampullary adenoma.  The various methods of treatment have been discussed with the patient and family. After consideration of risks, benefits and other options for treatment, the patient has consented to  Procedures: ULTRASOUND, UPPER GI TRACT, ENDOSCOPIC (N/A) EGD (ESOPHAGOGASTRODUODENOSCOPY) (N/A) as a surgical intervention.  The patient's history has been reviewed, patient examined, no change in status, stable for surgery.  I have reviewed the patient's chart and labs.  Questions were answered to the patient's satisfaction.     [name]

## 2024-07-14 NOTE — Progress Notes (Signed)
 "  HD#1 SUBJECTIVE:  Patient Summary: [name] is a 79 y.o. 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   Overnight Events: No acute events overnight.  Interim History: History obtained from the patient and chart review. On interview, he reports doing well overnight. He states that his breathing is significantly improved and closer to his baseline at home. He feels comfortable breathing on 3L [place] and denies any dyspnea at rest. He also denies any chest pain or increased work of breathing. When ambulating around his room, he denies experiencing any lightheadedness, dizziness, or sensation that he is going to faint or fall. He continues to endorse experiencing dark and tarry stools, most recently last night. He denies noticing any bright red blood. He reports no other questions or concerns at this time.  OBJECTIVE:  Vital Signs: Vitals:   07/13/24 1938 07/13/24 1943 07/13/24 2320 07/14/24 0740  BP:  (!) 146/82 139/69 (!) 151/76  Pulse:  (!) 56 66 (!) 55  Resp:  19 18 17   Temp:  99.1 F (37.3 C) 98 F (36.7 C)   TempSrc:  Oral Oral   SpO2: 96% 97% 100% 100%  Weight:      Height:       Supplemental O2: Nasal Cannula SpO2: 100 % O2 Flow Rate (L/min): 3 L/min FiO2 (%): 40 %  Filed Weights   07/11/24 0522 07/12/24 1624  Weight: 60 kg 61.3 kg     Intake/Output Summary (Last 24 hours) at 07/14/2024 0831 Last data filed at 07/13/2024 2000 Gross per 24 hour  Intake 1018 ml  Output 1300 ml  Net -282 ml   Net IO Since Admission: 35 mL [07/14/24 0831]  Physical Exam: Vital signs reviewed. General/Constitutional: chronically ill-appearing, cachectic elderly man laying  in bed in no acute distress Cardiovascular: irregularly, irregular rhythm, normal rate, no murmurs Respiratory: normal work of breathing on 3L [place], decreased breath sounds in all lung fields Skin: warm and dry Extremities: no peripheral edema Neurological: alert and oriented x3 Psych: normal mood and affect  Patient Lines/Drains/Airways Status     Active Line/Drains/Airways     Name Placement date Placement time Site Days   Peripheral IV 07/11/24 20 G Left Antecubital 07/11/24  0524  Antecubital  3   Suprapubic Catheter Latex 18 Fr. 07/08/22  1058  Latex  737   Wound 05/26/24 1815 Pressure Injury Penis Posterior Stage 3 -  Full thickness tissue loss. Subcutaneous fat may be visible but bone, tendon or muscle are NOT exposed. 05/26/24  1815  Penis  49            Pertinent labs and imaging:      Latest Ref Rng & Units 07/14/2024    7:08 AM 07/13/2024   11:05 PM 07/13/2024    2:59 PM  CBC  WBC 4.0 - 10.5 K/uL 6.1  6.2  6.1   Hemoglobin 13.0 - 17.0 g/dL 8.9  8.1  8.2   Hematocrit 39.0 - 52.0 % 28.1  25.4  25.8   Platelets 150 - 400 K/uL 77  78  80        Latest Ref Rng & Units 07/14/2024    7:08  AM 07/13/2024   10:51 AM 07/12/2024    6:13 AM  CMP  Glucose 70 - 99 mg/dL 81  96  897   BUN 8 - 23 mg/dL 29  32  29   Creatinine 0.61 - 1.24 mg/dL 8.50  8.47  8.41   Sodium 135 - 145 mmol/L 141  140  140   Potassium 3.5 - 5.1 mmol/L 4.5  4.5  4.6   Chloride 98 - 111 mmol/L 106  107  109   CO2 22 - 32 mmol/L 27  26  22    Calcium  8.9 - 10.3 mg/dL 8.9  8.3  8.6   Total Protein 6.5 - 8.1 g/dL   5.6   Total Bilirubin 0.0 - 1.2 mg/dL   0.4   Alkaline Phos 38 - 126 U/L   65   AST 15 - 41 U/L   33   ALT 0 - 44 U/L   32     No results found.  ASSESSMENT/PLAN:  Assessment: Principal Problem:   Acute hypoxic respiratory failure (HCC) Active Problems:   Chest pain   Shortness of breath   Plan: #Acute Blood Loss Anemia, suspected UGIB #Ampullary  Adenoma #Thrombocytopenia Patient continues to endorse melena overnight without hematochezia. He denies any symptoms related to his anemia. His Hgb has improved to 8.9 this morning from 6.6 following blood transfusion yesterday. Acute drop in Hgb most likely in setting of UGIB, however patient does have chronic anemia that may be in the setting of chronic disease. He has a history of prostate adenocarcinoma and had inappropriately normal reticulocyte count, indicating bone marrow may not be adequately replacing red cells. Last PET imaging in 2023 without evidence of metastasis to bone and patient certainly hasn't had any symptoms reflective of this. Platelets remain stable at 78 compared to prior CBCs. GI has plans for EGD/EUS today, will await further recommendations regarding bleeding/ampullary adenoma. Will need to clarify when patient can restart ASA after procedure. - GI consulted, appreciate recs  - IV protonix  40 mg bid  - Hold home ASA  - SCDs for DVT ppx  - EGD/EUS scheduled for 11AM  #Acute on chronic hypoxic respiratory failure #COPD on 3L [place] at home #HFmrEF Patient at baseline oxygen-dependence (3L [place]) without dyspnea or chest pain. Normal work of breathing with adequate oxygen saturation in the upper 90s. Breath sounds are diminished bilaterally, but lung fields are clear to auscultation. Appear euvolemic on exam without any swelling. His difficulty breathing and hypoxia on presentation may have stemmed from underlying anemia which is being addressed. Will continue to provide breathing treatments and hold home HF medications as he is scheduled for a procedure later today. - Hold Entresto  and Spironolactone  d/t hypotension - Hold Toprol -XL d/t bradycardia - Brovana  Nebulizer 15 mcg bid + Incruse Ellipta    #Possible Aortic EndoLeak seen on CTA #Aortic Aneurysm s/p repair Given patient's dyspnea, chest pain, and acute blood loss anemia, vascular surgery was consulted for possible aortic  endoleak seen on CTA. Dr. Pearline reviewed case and has no concern for dissection or worsening AAA. Recommends follow up outpatient with appointment scheduled on 07/26/24. Patient endorses improvement in his chest pain and dyspnea since admission, moving aortic endoleak lower on the differential. Will continue to monitor patient's status after upcoming EGD. - Hold home ASA d/t upcoming procedure - Continue home rosuvastatin   #Prior NSTEMI #Aortic atherosclerosis Patient reports improvement in his chest pain and dyspnea. Denies experiencing any episodes of chest pain overnight. Low concern for  ACS but will continue to monitor given patient's risk factors.  - Continuous cardiac monitoring - For new chest pain, stat EKG and troponins - Hold home ASA d/t upcoming procedure - Continue home rosuvastatin   #Persistent Afib not on [place] Patient is in atrial fibrillation with rate control (metoprolol  succinate 25 mg daily). His CHA2DS2-VASc score is 5, but is not on anticoagulation due to history of prior GI bleeding on Xarelto . His rates have been largely controlled without medication with a few episodes of bradycardia down to the 50s. - Continuous cardiac monitoring - Hold home metoprolol  d/t to bradycardia  #Concern for Malnutrition #Cachexia Patient remains chronically ill-appearing with cachexia (BMI 17.9). Likely in the setting of known prostate adenocarcinoma + emphysema/COPD. His Ampulla of Vater adenoma may also be contributing if there has been malignant transformation. Further workup as above. - Ensure supplements bid b/w meals - Continue home mirtazapine  15 mg nightly  #Chronic Kidney Disease, Stage 3 Appears to be at baseline Cr of 1.5-1.6. Received contrast as part of workup for possible dissection/acute aortic pathologies. Most recent BMP shows Cr at patient's normal baseline. Will avoid further nephrotoxic agents. - Continue to monitor  #HTN History of labile SBPs ranging from  100s-140s. Home medications include amlodipine  10 mg, Toprol -XL 25 mg, Entresto  97-103 mg, and spironolactone  12.5 mg. His BP has been normotensive with an episode of hypotension overnight. Will continue to hold home medications in the setting of suspected GI bleed and bradycardia. - Hold home metoprolol  d/t to bradycardia - Hold home Entresto  d/t hypotension, may restart at half dose if pressures tolerate - Hold home amlodipine  and spironolactone  d/t hypotension   Chronic Stable Medical Conditions   #Chronic Hip Pain - continue home Norco #TUD - continue nicotine  patches #GERD - IV protonix  40 mg bid #HLD - continue home rosuvastatin  #Depression - continue home escitalopram   Best Practice: Diet: NPO for upcoming procedure; Heart-Healthy after procedure IVF: Fluids: None, Rate: None VTE: Place and maintain sequential compression device Start: 07/12/24 1131 Code: Full  Disposition planning: Therapy Recs: None, DME: none Family Contact: Sister, to be notified. DISPO: Anticipated discharge in 1-2 days to Home pending procedure for suspected upper GI bleed.  Signature:  Elia LULLA [facility]  8:31 AM, 07/14/2024  On Call pager [phone]  Attestation for Student Documentation:  I personally was present and re-performed the history, physical exam and medical decision-making activities of this service and have verified that the service and findings are accurately documented in the students note.  Waymond Cart, MD 07/14/2024, 10:53 AM  "

## 2024-07-14 NOTE — Plan of Care (Signed)

## 2024-07-14 NOTE — Anesthesia Postprocedure Evaluation (Signed)
"   Anesthesia Post Note  Patient: [name] Her  Procedure(s) Performed: ULTRASOUND, UPPER GI TRACT, ENDOSCOPIC EGD (ESOPHAGOGASTRODUODENOSCOPY) BIOPSY, SKIN, SUBCUTANEOUS TISSUE, OR MUCOUS MEMBRANE     Patient location during evaluation: PACU Anesthesia Type: MAC Level of consciousness: awake and alert Pain management: pain level controlled Vital Signs Assessment: post-procedure vital signs reviewed and stable Respiratory status: spontaneous breathing, nonlabored ventilation, respiratory function stable and patient connected to nasal cannula oxygen Cardiovascular status: blood pressure returned to baseline and stable Postop Assessment: no apparent nausea or vomiting Anesthetic complications: no   There were no known notable events for this encounter.  Last Vitals:  Vitals:   07/14/24 1440 07/14/24 1500  BP: 113/61 135/74  Pulse: 68 67  Resp: (!) 21 18  Temp:  36.4 C  SpO2: 95%     Last Pain:  Vitals:   07/14/24 1722  TempSrc:   PainSc: 6    Pain Goal:                   [name]      "

## 2024-07-14 NOTE — Op Note (Signed)
 [facility] Patient Name: [name] Procedure Date : 07/14/2024 MRN: [id number] Attending MD: Aloha Finner , MD, [id number] Date of Birth: 05/29/1946 CSN: [id number] Age: 79 Admit Type: Inpatient Procedure:                Upper EUS Indications:              Ampullary villous adenoma, Iron deficiency anemia                            secondary to chronic blood loss, Heme positive                            stool, Polyps in the duodenum, Follow-up of polyps                            in the duodenum Providers:                Aloha Finner, MD, Gregoria Pierce, RN, Felice Sar, Technician Referring MD:              Medicines:                Monitored Anesthesia Care Complications:            No immediate complications. Estimated Blood Loss:     Estimated blood loss was minimal. Procedure:                Pre-Anesthesia Assessment:                           - Prior to the procedure, a History and Physical                            was performed, and patient medications and                            allergies were reviewed. The patient's tolerance of                            previous anesthesia was also reviewed. The risks                            and benefits of the procedure and the sedation                            options and risks were discussed with the patient.                            All questions were answered, and informed consent                            was obtained. Prior Anticoagulants: The patient has  taken Plavix  (clopidogrel ), last dose was 3 days                            prior to procedure. ASA Grade Assessment: III - A                            patient with severe systemic disease. After                            reviewing the risks and benefits, the patient was                            deemed in satisfactory condition to undergo the                             procedure.                           After obtaining informed consent, the endoscope was                            passed under direct vision. Throughout the                            procedure, the patient's blood pressure, pulse, and                            oxygen saturations were monitored continuously. The                            [id number]-[id number] ([id number]) Olympus endoscope was introduced                            through the mouth, and advanced to the duodenal                            bulb. The [id number]-[id number] ([id number]) Olympus duodenoscope                            was introduced through the mouth, and advanced to                            the duodenal bulb. After obtaining informed                            consent, the endoscope was passed under direct                            vision. Throughout the procedure, the patient's                            blood pressure, pulse, and oxygen saturations were  monitored continuously.The upper EUS was                            accomplished without difficulty. The patient                            tolerated the procedure. We required transition to                            pediatric colonoscope to enter in D2 and also use                            of the Linear EUS scope to traverse into D2 with                            reposition of the patient. Scope In: Scope Out: Findings:      ENDOSCOPIC FINDING: :      No gross lesions were noted in the entire esophagus.      The Z-line was irregular and was found 47 cm from the incisors.      A 2 cm hiatal hernia was present.      A deformity was found in the entire examined stomach.      Scattered moderate inflammation characterized by erosions and erythema       was found in the entire examined stomach. Biopsies were taken with a       cold forceps for histology and Helicobacter pylori testing.      Patchy mildly erythematous mucosa without active bleeding was  found in       the duodenal bulb, in the first portion of the duodenum and in the       second portion of the duodenum.      A single 35-40 mm semi-sessile polyp with no bleeding was found at the       major papilla and in the area of the papilla (this is larger than       previously noted). Biopsies were taken with a cold forceps for histology.      ENDOSONOGRAPHIC FINDING: :      Localized wall thickening was visualized endosonographically in the       ampulla. This appeared to primarily be due to thickening within the       luminal interface/superficial mucosa (Layer 1) and deep mucosa (Layer       2). The thickness of the abnormal layers.      There was no sign of significant endosonographic abnormality in the       common bile duct. The maximum diameter of the duct was 5 mm. Ducts of       normal caliber were identified.      Multiple stones were visualized endosonographically in the gallbladder.       The stones were irregular. They were hyperechoic and characterized by       shadowing.      Pancreatic parenchymal abnormalities were noted in the pancreatic head.       These consisted of hyperechoic foci with shadowing.      There was no sign of significant endosonographic abnormality in the genu       of the pancreas, pancreatic body and pancreatic tail. No masses, no  cysts, the pancreatic duct was regular in contour.      Endosonographic imaging in the visualized portion of the liver showed no       mass.      No malignant-appearing lymph nodes were visualized in the celiac region       (level 20), peripancreatic region and porta hepatis region.      Endosonographic imaging in the gastroesophageal junction showed no wall       thickening.      The celiac region was visualized.      The esophagus, stomach and duodenum were examined endosonographically. Impression:               EGD Impression:                           - No gross lesions in the entire esophagus. Z-line                             irregular, 47 cm from the incisors.                           - 2 cm hiatal hernia.                           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                           - Gastritis. Biopsied.                           - Erythematous duodenopathy.                           - A single duodenal polyp involving the major                            papilla entirely and inferior and superiorly (this                            is larger than previously noted 2.5 years ago).                            Biopsied.                           EUS Impression:                           - Wall thickening was seen in the ampulla. It                            appeared to primarily be within the luminal                            interface/superficial mucosa (Layer 1) and deep  mucosa (Layer 2). Overt intramural nodularity was                            not present on today's examination (but I could not                            get the radial EUS scope to get to this area due to                            deformity noted above in regards to passage of                            scope.                           - There was no sign of significant pathology in the                            common bile duct.                           - Multiple stones were visualized                            endosonographically in the gallbladder.                           - Pancreatic parenchymal abnormalities consisting                            of hyperechoic foci were noted in the pancreatic                            head. Otherwise, there was no sign of significant                            pathology in the genu of the pancreas, pancreatic                            body and pancreatic tail.                           - No malignant-appearing lymph nodes were                            visualized in the celiac region (level 20),                             peripancreatic region and porta hepatis region. Recommendation:           - The patient will be observed post-procedure,                            until all discharge criteria are met.                           -  Return patient to hospital ward for ongoing care.                           - Advance diet as tolerated.                           - Increase PPI to BID dosing for next 82-months then                            may decrease back to once daily.                           - Carafate  1 g twice daily for 1 month.                           - Followup with primary GI to discuss if he would                            want referral to [facility] to consider                            endoscopic resection attempt or with the rest of                            his medical comorbidities, would he just continue                            surveillance or stop surveillance completely?                           - Await path results.                           - If issues of anemia progress/worsen consider                            updated colonoscopy with primary GI.                           - May restart Plavix  on 1/30.                           - Further endoscopic evaluation should be prepared                            with pediatric colonoscope to enter into D2 region                            and ERCP this time difficult to enter into D2 but                            Linear EUS was able to make it into D2.                           -  The findings and recommendations were discussed                            with the patient.                           - The findings and recommendations were discussed                            with the referring physician. Procedure Code(s):        --- Professional ---                           [phone], Esophagogastroduodenoscopy, flexible,                            transoral; with endoscopic ultrasound examination,                             including the esophagus, stomach, and either the                            duodenum or a surgically altered stomach where the                            jejunum is examined distal to the anastomosis                           43239, Esophagogastroduodenoscopy, flexible,                            transoral; with biopsy, single or multiple Diagnosis Code(s):        --- Professional ---                           K22.89, Other specified disease of esophagus                           K44.9, Diaphragmatic hernia without obstruction or                            gangrene                           K29.70, Gastritis, unspecified, without bleeding                           K31.89, Other diseases of stomach and duodenum                           K31.7, Polyp of stomach and duodenum                           K80.20, Calculus of gallbladder without                            cholecystitis without obstruction  K86.9, Disease of pancreas, unspecified                           I89.9, Noninfective disorder of lymphatic vessels                            and lymph nodes, unspecified                           D37.6, Neoplasm of uncertain behavior of liver,                            gallbladder and bile ducts                           D50.0, Iron deficiency anemia secondary to blood                            loss (chronic)                           R19.5, Other fecal abnormalities                           K83.8, Other specified diseases of biliary tract CPT copyright 2022 [employer]. All rights reserved. The codes documented in this report are preliminary and upon coder review may  be revised to meet current compliance requirements. Aloha Finner, MD 07/14/2024 2:35:18 PM Number of Addenda: 0

## 2024-07-14 NOTE — Anesthesia Preprocedure Evaluation (Signed)
"                                    Anesthesia Evaluation  Patient identified by MRN, date of birth, ID band Patient awake    Reviewed: Allergy & Precautions, NPO status , Patient's Chart, lab work & pertinent test results, reviewed documented beta blocker date and time   Airway Mallampati: II  TM Distance: >3 FB Neck ROM: Full    Dental  (+) Dental Advisory Given, Edentulous Lower, Edentulous Upper   Pulmonary COPD (3L [place]),  oxygen dependent, Current Smoker and Patient abstained from smoking.   Pulmonary exam normal breath sounds clear to auscultation       Cardiovascular hypertension, Pt. on medications and Pt. on home beta blockers + CAD, + Past MI and +CHF  Normal cardiovascular exam Rhythm:Regular Rate:Normal     Neuro/Psych negative neurological ROS     GI/Hepatic ,GERD  ,,(+)     substance abuse  alcohol useMelena, anemia, history of ampullary adenoma   Endo/Other  negative endocrine ROS    Renal/GU Renal InsufficiencyRenal disease     Musculoskeletal  (+) Arthritis ,    Abdominal   Peds  Hematology  (+) Blood dyscrasia (Plavix )   Anesthesia Other Findings Day of surgery medications reviewed with the patient.  Reproductive/Obstetrics                              Anesthesia Physical Anesthesia Plan  ASA: 4  Anesthesia Plan: MAC   Post-op Pain Management: Minimal or no pain anticipated   Induction: Intravenous  PONV Risk Score and Plan: 0 and TIVA and Treatment may vary due to age or medical condition  Airway Management Planned: Natural Airway and Simple Face Mask  Additional Equipment:   Intra-op Plan:   Post-operative Plan:   Informed Consent: I have reviewed the patients History and Physical, chart, labs and discussed the procedure including the risks, benefits and alternatives for the proposed anesthesia with the patient or authorized representative who has indicated his/her understanding and  acceptance.     Dental advisory given  Plan Discussed with: CRNA  Anesthesia Plan Comments:         Anesthesia Quick Evaluation  "

## 2024-07-15 ENCOUNTER — Telehealth: Payer: Self-pay | Admitting: Gastroenterology

## 2024-07-15 ENCOUNTER — Encounter: Payer: Self-pay | Admitting: Internal Medicine

## 2024-07-15 ENCOUNTER — Encounter (HOSPITAL_COMMUNITY): Payer: Self-pay | Admitting: Gastroenterology

## 2024-07-15 LAB — BASIC METABOLIC PANEL WITH GFR
Anion gap: 6 (ref 5–15)
BUN: 44 mg/dL — ABNORMAL HIGH (ref 8–23)
CO2: 27 mmol/L (ref 22–32)
Calcium: 8.6 mg/dL — ABNORMAL LOW (ref 8.9–10.3)
Chloride: 106 mmol/L (ref 98–111)
Creatinine, Ser: 1.45 mg/dL — ABNORMAL HIGH (ref 0.61–1.24)
GFR, Estimated: 49 mL/min — ABNORMAL LOW
Glucose, Bld: 87 mg/dL (ref 70–99)
Potassium: 4.8 mmol/L (ref 3.5–5.1)
Sodium: 139 mmol/L (ref 135–145)

## 2024-07-15 LAB — CBC WITH DIFFERENTIAL/PLATELET
Abs Immature Granulocytes: 0.02 10*3/uL (ref 0.00–0.07)
Abs Immature Granulocytes: 0.02 10*3/uL (ref 0.00–0.07)
Basophils Absolute: 0 10*3/uL (ref 0.0–0.1)
Basophils Absolute: 0 10*3/uL (ref 0.0–0.1)
Basophils Relative: 0 %
Basophils Relative: 0 %
Eosinophils Absolute: 0 10*3/uL (ref 0.0–0.5)
Eosinophils Absolute: 0 10*3/uL (ref 0.0–0.5)
Eosinophils Relative: 1 %
Eosinophils Relative: 1 %
HCT: 24.3 % — ABNORMAL LOW (ref 39.0–52.0)
HCT: 24.1 % — ABNORMAL LOW (ref 39.0–52.0)
Hemoglobin: 7.6 g/dL — ABNORMAL LOW (ref 13.0–17.0)
Hemoglobin: 7.5 g/dL — ABNORMAL LOW (ref 13.0–17.0)
Immature Granulocytes: 0 %
Immature Granulocytes: 0 %
Lymphocytes Relative: 13 %
Lymphocytes Relative: 14 %
Lymphs Abs: 0.8 10*3/uL (ref 0.7–4.0)
Lymphs Abs: 0.8 10*3/uL (ref 0.7–4.0)
MCH: 30.9 pg (ref 26.0–34.0)
MCH: 30.4 pg (ref 26.0–34.0)
MCHC: 31.3 g/dL (ref 30.0–36.0)
MCHC: 31.1 g/dL (ref 30.0–36.0)
MCV: 98.8 fL (ref 80.0–100.0)
MCV: 97.6 fL (ref 80.0–100.0)
Monocytes Absolute: 0.6 10*3/uL (ref 0.1–1.0)
Monocytes Absolute: 0.6 10*3/uL (ref 0.1–1.0)
Monocytes Relative: 10 %
Monocytes Relative: 10 %
Neutro Abs: 4.5 10*3/uL (ref 1.7–7.7)
Neutro Abs: 4.2 10*3/uL (ref 1.7–7.7)
Neutrophils Relative %: 76 %
Neutrophils Relative %: 75 %
Platelets: 75 10*3/uL — ABNORMAL LOW (ref 150–400)
Platelets: 70 10*3/uL — ABNORMAL LOW (ref 150–400)
RBC: 2.46 MIL/uL — ABNORMAL LOW (ref 4.22–5.81)
RBC: 2.47 MIL/uL — ABNORMAL LOW (ref 4.22–5.81)
RDW: 17.7 % — ABNORMAL HIGH (ref 11.5–15.5)
RDW: 17.5 % — ABNORMAL HIGH (ref 11.5–15.5)
WBC: 6 10*3/uL (ref 4.0–10.5)
WBC: 5.6 10*3/uL (ref 4.0–10.5)
nRBC: 0 % (ref 0.0–0.2)
nRBC: 0 % (ref 0.0–0.2)

## 2024-07-15 LAB — HAPTOGLOBIN: Haptoglobin: 157 mg/dL (ref 34–355)

## 2024-07-15 MED ORDER — PANTOPRAZOLE SODIUM 40 MG PO TBEC: 40.0000 mg | DELAYED_RELEASE_TABLET | Freq: Every day | ORAL | Status: DC

## 2024-07-15 MED ORDER — PANTOPRAZOLE SODIUM 40 MG PO TBEC
40.0000 mg | DELAYED_RELEASE_TABLET | Freq: Two times a day (BID) | ORAL | Status: DC
  Administered 2024-07-15 – 2024-07-19 (×9): 40 mg via ORAL
  Filled 2024-07-15 (×9): qty 1

## 2024-07-15 MED ORDER — PANTOPRAZOLE SODIUM 40 MG IV SOLR: 40.0000 mg | Freq: Two times a day (BID) | INTRAVENOUS | Status: DC

## 2024-07-15 NOTE — Telephone Encounter (Signed)
-----   Message from Aloha Finner, MD sent at 07/14/2024  2:38 PM EST ----- Regarding: Hospital followup needed Christy, Please work on scheduling followup with Dr. Avram (not APP) for patient in setting of progressively enlarging ampullary adenoma and non-specific Anemia with findings of erosive gastritis on recent EGD. Can be set up in the next 6-weeks. Thanks. GM

## 2024-07-15 NOTE — Progress Notes (Signed)
DME order updated

## 2024-07-15 NOTE — Progress Notes (Signed)
 "  HD#2 SUBJECTIVE:  Patient Summary: [name] is a 79 y.o. 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  Overnight Events: Overnight, hemoglobin dropped from 8.9 at 1600 to 7.9 at 2300.  Interim History: History collected from patient and chart review. On interview, the patient reports feeling well this morning. He endorses coughing up a small amount of blood once since his endoscopy. He reports that the amount of blood was significantly smaller than a teaspoon and not recurrent. He was not concerned about this. He continues to endorse having bowel movements. His last bowel movement was a dark and tarry hard stool. He endorses breathing well without any chest pain or dyspnea. He denies any lightheadedness or dizziness. He also denies any nausea or vomiting. We discussed the results of his endoscopy from yesterday and discussed following up with GI outpatient to address his ampullary mass. He reports no other questions or concerns this morning.  OBJECTIVE:  Vital Signs: Vitals:   07/15/24 0019 07/15/24 0312 07/15/24 0738 07/15/24 0900  BP: 106/64 127/76 111/68   Pulse: (!) 149 66 77 70  Resp: 18 18 18 18   Temp: 97.6 F (36.4 C) 99.9 F (37.7 C) 99.1 F (37.3 C)   TempSrc:  Oral Oral   SpO2: 100% 98% 100%   Weight:      Height:       Supplemental O2: Nasal Cannula SpO2: 100 % O2 Flow Rate (L/min): 3 L/min FiO2 (%): 32 %  Filed Weights   07/11/24 0522 07/12/24 1624  Weight: 60 kg 61.3 kg     Intake/Output Summary (Last 24 hours) at 07/15/2024 1013 Last data filed at 07/15/2024 0659 Gross per 24 hour  Intake 500 ml  Output 1100 ml  Net -600 ml   Net IO Since Admission: -1,065 mL  [07/15/24 1013]  Physical Exam: Vital signs reviewed. General/Constitutional: chronically ill-appearing, cachectic elderly male lying in bed in no acute distress Cardiovascular: irregularly irregular rhythm with normal rate, no murmurs Respiratory: normal work of breathing on home 3L [place], bronchial breath sounds present in bilateral anterior lung fields Abdomen: soft, supple, non-tender, and non-distended, intact bowel sounds Neurological: alert and oriented x3, no focal deficit noted MSK: no edema noted to BLE Skin: warm and dry Psych: normal mood and affect   Patient Lines/Drains/Airways Status     Active Line/Drains/Airways     Name Placement date Placement time Site Days   Peripheral IV 07/11/24 20 G Left Antecubital 07/11/24  0524  Antecubital  4   Suprapubic Catheter Latex 18 Fr. 07/08/22  1058  Latex  738            Pertinent labs and imaging:     Latest Ref Rng & Units 07/15/2024    6:55 AM 07/14/2024   10:56 PM 07/14/2024    4:07 PM  CBC  WBC 4.0 - 10.5 K/uL 6.0  6.2  5.7   Hemoglobin 13.0 - 17.0 g/dL 7.6  7.9  8.9   Hematocrit 39.0 - 52.0 % 24.3  25.3  28.6   Platelets 150 - 400 K/uL 75  74  78        Latest Ref Rng & Units 07/15/2024    6:55  AM 07/14/2024    7:08 AM 07/13/2024   10:51 AM  CMP  Glucose 70 - 99 mg/dL 87  81  96   BUN 8 - 23 mg/dL 44  29  32   Creatinine 0.61 - 1.24 mg/dL 8.54  8.50  8.47   Sodium 135 - 145 mmol/L 139  141  140   Potassium 3.5 - 5.1 mmol/L 4.8  4.5  4.5   Chloride 98 - 111 mmol/L 106  106  107   CO2 22 - 32 mmol/L 27  27  26    Calcium  8.9 - 10.3 mg/dL 8.6  8.9  8.3     No results found.  ASSESSMENT/PLAN:  Assessment: Principal Problem:   Acute hypoxic respiratory failure (HCC) Active Problems:   Chest pain   Shortness of breath   Erosive gastritis   Plan: #Acute Blood Loss Anemia, 2/2 Erosive Gastritis #Ampullary Adenoma #Thrombocytopenia Patient continues to endorse melena overnight without hematochezia.  Currently denying any symptoms related to his anemia. His Hgb has decreased from 8.9 to 7.9 overnight with further drop to 7.6 this morning. His acute drop is likely secondary to the diffuse gastritis noted on EGD yesterday. Endoscopy showed gastritis of the stomach and duodenal bulb without any signs of active, brisk bleeding. His anemia could also be secondary to anemia of chronic disease given his prostate adenocarcinoma with possible bone marrow infiltration (as evidenced by inappropriately normal reticulocyte counts). Last PET scan in 2023 did not show metastasis of his malignancy. Patient is also not exhibiting any symptoms consistent with metastatic spread. Endoscopy also showed growth of his ampullary adenoma without obstruction of local biliary structures. GI recommends outpatient follow up to reassess plan for adenoma based on patient's comorbidities. GI also recommends outpatient colonoscopy follow up. No acute indication for colonoscopy for his bleeding. Platelets remain stable today at 75 compared to prior CBCs. Given patient's drop in hemoglobin is slowing, we will continue to monitor with hope of resolution as his gastritis improves. We will currently continue to hold patient's home antiplatelet therapy in light of his Hgb drop, restarting once his hemoglobin stabilizes (likely later than GI recommendation of 01/30). GI signed off. - Start PO Protonix  40 mg bid x 60 days, then daily - Start Carafate  1g bid x 30 days - Hold home ASA and Plavix  - SCDs for DVT ppx - Outpatient GI follow up scheduled with Dr. Avram - CBC q8h - F/u H. Pylori biopsies  #History of Hypertension #Hypotension History of labile SBPs ranging from 100s-140s. Home medications include amlodipine  10 mg, Toprol -XL 25 mg, Entresto  97-103 mg, and spironolactone  12.5 mg. His BP has been normotensive overnight with recent low values likely in setting of bleeding. Will continue to hold home medications d/t hypotension and  bradycardia. Will consider restarting antihypertensives if pressures can tolerate. - Hold home metoprolol  d/t to bradycardia - Hold home Entresto  d/t hypotension, may restart at half dose if pressures tolerate - Hold home amlodipine  and spironolactone  d/t hypotension  #Acute on chronic hypoxic respiratory failure #COPD on 3L [place] at home #HFmrEF Patient at baseline oxygen-dependence (3L ) without dyspnea or chest pain. Normal work of breathing with adequate oxygen saturation in the upper 90s. Bronchial breath sounds auscultated in the anterior lung fields bilaterally but no wheezes. Appear euvolemic on exam without any swelling. His difficulty breathing and hypoxia on presentation is likely related to his underlying anemia which we are monitoring. Will continue to provide breathing treatments and hold home HF medications. Will  consider restarting HF medications if his pressures and heart rates can tolerate. - Hold Entresto  and Spironolactone  d/t hypotension - Hold Toprol -XL d/t bradycardia - Brovana  Nebulizer 15 mcg bid + Incruse Ellipta    #Possible Aortic EndoLeak seen on CTA #Aortic Aneurysm s/p repair Given patient's dyspnea, chest pain, and acute blood loss anemia, vascular surgery consulted for possible aortic endoleak seen on CTA. Dr. Pearline reviewed case, has no concern for dissection or worsening AAA, and recommends follow up outpatient. Appointment scheduled for 07/26/24. Patient endorses improvement in his chest pain and dyspnea since admission. Will continue to monitor patient's status. - Hold home ASA d/t recent Hgb drop - Continue home rosuvastatin   #Prior NSTEMI #Aortic atherosclerosis Patient reports improvement in his chest pain and dyspnea. Denies experiencing any episodes of chest pain or dyspnea overnight. Low concern for ACS but will continue to monitor given patient's risk factors.  - Continuous cardiac monitoring - For new chest pain, stat EKG and troponins - Hold home  ASA d/t ABLA - Continue home rosuvastatin    #Persistent Afib not on [facility]  Patient is in atrial fibrillation with rate control (metoprolol  succinate 25 mg daily). His CHA2DS2-VASc score is 5. He is not on anticoagulation due to history of prior GI bleeding on Xarelto . His rates have been largely controlled without medication. If his rates increase, will plan to restart metoprolol  12.5 mg daily. - Continuous cardiac monitoring - Hold home metoprolol  d/t to bradycardia  #Concern for Malnutrition #Cachexia Patient remains chronically ill-appearing with cachexia (BMI 17.9). Likely in the setting of known prostate adenocarcinoma + emphysema/COPD. His Ampulla of Vater adenoma may also be contributing if there has been malignant transformation. Further workup of ampullary adenoma in outpatient setting. Further workup as above. - Ensure supplements bid b/w meals - Continue home mirtazapine  15 mg nightly   #Chronic Kidney Disease, Stage 3  Appears to be at baseline Cr of 1.5-1.6. Most recent BMP shows Cr at patient's normal baseline with elevated BUN. BUN likely elevated due to break down of blood secondary to blood loss. No acute concerns. - Continue to monitor - Trend BMPs  Chronic Stable Medical Conditions   #Chronic Hip Pain - continue home Norco #TUD - continue nicotine  patches #GERD - IV protonix  40 mg bid #HLD - continue home rosuvastatin  #Depression - continue home escitalopram   Best Practice: Diet: Heart-healthy IVF: Fluids: None, Rate: None VTE: Place and maintain sequential compression device Start: 07/12/24 1131 Code: Full  Disposition planning: Therapy Recs: None, DME: none Family Contact: sister, to be notified. DISPO: Anticipated discharge in 1-2 days to Home pending stabilization of bleeding.  Signature:  Elia LULLA [facility]  10:13 AM, 07/15/2024  On Call pager [phone]  Attestation for Student Documentation:  I personally was  present and re-performed the history, physical exam and medical decision-making activities of this service and have verified that the service and findings are accurately documented in the students note.  Waymond Cart, MD 07/15/2024, 1:52 PM  "

## 2024-07-15 NOTE — Addendum Note (Signed)
 Addended by: [name] on: 07/15/2024 02:33 PM   Modules accepted: Orders

## 2024-07-15 NOTE — Plan of Care (Signed)
" °  Problem: Clinical Measurements: Goal: Ability to maintain clinical measurements within normal limits will improve Outcome: Progressing Goal: Diagnostic test results will improve Outcome: Progressing Goal: Respiratory complications will improve Outcome: Progressing   Problem: Nutrition: Goal: Adequate nutrition will be maintained Outcome: Progressing   Problem: Pain Managment: Goal: General experience of comfort will improve and/or be controlled Outcome: Progressing   "

## 2024-07-15 NOTE — Progress Notes (Signed)
 Physical Therapy Treatment Patient Details Name: [name] MRN: [id number] DOB: Oct 10, 1945 Today's Date: 07/15/2024   History of Present Illness Patient is a 78 yo male presenting to the ED with chest pain and SOB on 07/11/24. Admitted with acute respiratory failure. Placed on 15L NRB. 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 Pt had one anterior loss of balance when attempting to ambulate without AD, per pt request, with pt then reaching for counter to assist with maintaining upright with therapist providing contact guard level of assistance. Therapist educated pt on importance of utilizing AD to reduce risk of falls. PT will continue to treat pt while he is admitted. Anticipate no follow up PT needed at discharge.     If plan is discharge home, recommend the following: Assist for transportation   Can travel by private vehicle        Equipment Recommendations  None recommended by PT    Recommendations for Other Services       Precautions / Restrictions Precautions Precautions: Fall;Other (comment) Recall of Precautions/Restrictions: Intact Precaution/Restrictions Comments: watch O2 Restrictions Weight Bearing Restrictions Per Provider Order: No     Mobility  Bed Mobility Overal bed mobility: Modified Independent             General bed mobility comments: increased time to complete    Transfers Overall transfer level: Needs assistance Equipment used: None Transfers: Sit to/from Stand Sit to Stand: Supervision           General transfer comment: closeby supervision for safety    Ambulation/Gait Ambulation/Gait assistance: Contact guard assist Gait Distance (Feet): 400 Feet Assistive device: Rollator (4 wheels) Gait  Pattern/deviations: Step-through pattern, Decreased stride length, Trunk flexed, Narrow base of support, Scissoring Gait velocity: reduced Gait velocity interpretation: <1.8 ft/sec, indicate of risk for recurrent falls   General Gait Details: Pt attempts to ambulate without AD, demonstrating scissor like gait and immediatley tripping over R foot, reaching out for counter with BUE for improved stability. Pt then ambulated 400' with rollator, demonstrating improved balance with no losses of balance.   Stairs             Wheelchair Mobility     Tilt Bed    Modified Rankin (Stroke Patients Only)       Balance Overall balance assessment: Needs assistance Sitting-balance support: No upper extremity supported, Feet supported Sitting balance-Leahy Scale: Good Sitting balance - Comments: seated EOB   Standing balance support: Bilateral upper extremity supported, During functional activity, Reliant on assistive device for balance Standing balance-Leahy Scale: Poor Standing balance comment: Pt attempted to ambulate without UE support and had one anterior LOB requiring BUE support on counter to remain upright. Pt then ambulated with AD demonstrating improved balance.                            Communication Communication Communication: No apparent difficulties  Cognition Arousal: Alert Behavior During Therapy: Impulsive   PT - Cognitive impairments: Safety/Judgement                       PT - Cognition Comments: Pt reports, I do not need that when therapist educates on purpose of gait belt, stands up  and then trips over his feet, and continues to report, I don't need that to gait belt or AD. Following commands: Intact      Cueing Cueing Techniques: Verbal cues  Exercises      General Comments General comments (skin integrity, edema, etc.): SpO2 96% on 3L while ambulating      Pertinent Vitals/Pain Pain Assessment Pain Assessment: No/denies pain     Home Living                          Prior Function            PT Goals (current goals can now be found in the care plan section) Acute Rehab PT Goals Patient Stated Goal: return home PT Goal Formulation: With patient Time For Goal Achievement: 07/26/24 Potential to Achieve Goals: Good Progress towards PT goals: Progressing toward goals    Frequency    Min 1X/week      PT Plan      Co-evaluation              AM-PAC PT 6 Clicks Mobility   Outcome Measure  Help needed turning from your back to your side while in a flat bed without using bedrails?: None Help needed moving from lying on your back to sitting on the side of a flat bed without using bedrails?: None Help needed moving to and from a bed to a chair (including a wheelchair)?: None Help needed standing up from a chair using your arms (e.g., wheelchair or bedside chair)?: A Little Help needed to walk in hospital room?: A Little Help needed climbing 3-5 steps with a railing? : A Little 6 Click Score: 21    End of Session Equipment Utilized During Treatment: Oxygen Activity Tolerance: Patient tolerated treatment well Patient left: in bed;with call bell/phone within reach Nurse Communication: Mobility status;Other (comment) (pt requesting RN look at bowel movement and blood he coughed up while in bathroom) PT Visit Diagnosis: Other abnormalities of gait and mobility (R26.89);Muscle weakness (generalized) (M62.81)     Time: 8586-8553 PT Time Calculation (min) (ACUTE ONLY): 33 min  Charges:    $Gait Training: 8-22 mins $Therapeutic Activity: 8-22 mins PT General Charges $$ ACUTE PT VISIT: 1 Visit                     [name] DPT Acute Rehab Services [phone] Prefer contact via chat    Leontine NOVAK Chonita Gadea 07/15/2024, 3:08 PM

## 2024-07-15 NOTE — Telephone Encounter (Signed)
 Patient is scheduled for 09/01/14 with Dr. Avram @ 2:50pm

## 2024-07-16 DIAGNOSIS — J9601 Acute respiratory failure with hypoxia: Secondary | ICD-10-CM | POA: Diagnosis not present

## 2024-07-16 DIAGNOSIS — I5030 Unspecified diastolic (congestive) heart failure: Secondary | ICD-10-CM

## 2024-07-16 DIAGNOSIS — D135 Benign neoplasm of extrahepatic bile ducts: Secondary | ICD-10-CM | POA: Diagnosis not present

## 2024-07-16 DIAGNOSIS — I959 Hypotension, unspecified: Secondary | ICD-10-CM

## 2024-07-16 DIAGNOSIS — J449 Chronic obstructive pulmonary disease, unspecified: Secondary | ICD-10-CM | POA: Diagnosis not present

## 2024-07-16 DIAGNOSIS — Z7902 Long term (current) use of antithrombotics/antiplatelets: Secondary | ICD-10-CM

## 2024-07-16 DIAGNOSIS — K635 Polyp of colon: Secondary | ICD-10-CM

## 2024-07-16 DIAGNOSIS — I4819 Other persistent atrial fibrillation: Secondary | ICD-10-CM | POA: Diagnosis not present

## 2024-07-16 DIAGNOSIS — R64 Cachexia: Secondary | ICD-10-CM | POA: Diagnosis not present

## 2024-07-16 DIAGNOSIS — D62 Acute posthemorrhagic anemia: Secondary | ICD-10-CM | POA: Diagnosis present

## 2024-07-16 DIAGNOSIS — I700 Atherosclerosis of aorta: Secondary | ICD-10-CM | POA: Diagnosis not present

## 2024-07-16 DIAGNOSIS — N183 Chronic kidney disease, stage 3 unspecified: Secondary | ICD-10-CM | POA: Diagnosis not present

## 2024-07-16 DIAGNOSIS — D696 Thrombocytopenia, unspecified: Secondary | ICD-10-CM | POA: Diagnosis not present

## 2024-07-16 DIAGNOSIS — I130 Hypertensive heart and chronic kidney disease with heart failure and stage 1 through stage 4 chronic kidney disease, or unspecified chronic kidney disease: Secondary | ICD-10-CM | POA: Diagnosis not present

## 2024-07-16 DIAGNOSIS — D649 Anemia, unspecified: Secondary | ICD-10-CM | POA: Diagnosis present

## 2024-07-16 LAB — CBC WITH DIFFERENTIAL/PLATELET
Abs Immature Granulocytes: 0.03 10*3/uL (ref 0.00–0.07)
Abs Immature Granulocytes: 0.02 10*3/uL (ref 0.00–0.07)
Abs Immature Granulocytes: 0.02 10*3/uL (ref 0.00–0.07)
Basophils Absolute: 0 10*3/uL (ref 0.0–0.1)
Basophils Absolute: 0 10*3/uL (ref 0.0–0.1)
Basophils Absolute: 0 10*3/uL (ref 0.0–0.1)
Basophils Relative: 0 %
Basophils Relative: 0 %
Basophils Relative: 0 %
Eosinophils Absolute: 0.1 10*3/uL (ref 0.0–0.5)
Eosinophils Absolute: 0 10*3/uL (ref 0.0–0.5)
Eosinophils Absolute: 0.1 10*3/uL (ref 0.0–0.5)
Eosinophils Relative: 1 %
Eosinophils Relative: 1 %
Eosinophils Relative: 2 %
HCT: 20.7 % — ABNORMAL LOW (ref 39.0–52.0)
HCT: 23.4 % — ABNORMAL LOW (ref 39.0–52.0)
HCT: 23.8 % — ABNORMAL LOW (ref 39.0–52.0)
Hemoglobin: 6.6 g/dL — CL (ref 13.0–17.0)
Hemoglobin: 7.3 g/dL — ABNORMAL LOW (ref 13.0–17.0)
Hemoglobin: 7.4 g/dL — ABNORMAL LOW (ref 13.0–17.0)
Immature Granulocytes: 0 %
Immature Granulocytes: 0 %
Immature Granulocytes: 0 %
Lymphocytes Relative: 16 %
Lymphocytes Relative: 16 %
Lymphocytes Relative: 17 %
Lymphs Abs: 1 10*3/uL (ref 0.7–4.0)
Lymphs Abs: 1 10*3/uL (ref 0.7–4.0)
Lymphs Abs: 1 10*3/uL (ref 0.7–4.0)
MCH: 30.8 pg (ref 26.0–34.0)
MCH: 30.4 pg (ref 26.0–34.0)
MCH: 30.2 pg (ref 26.0–34.0)
MCHC: 31.9 g/dL (ref 30.0–36.0)
MCHC: 31.2 g/dL (ref 30.0–36.0)
MCHC: 31.1 g/dL (ref 30.0–36.0)
MCV: 96.7 fL (ref 80.0–100.0)
MCV: 97.5 fL (ref 80.0–100.0)
MCV: 97.1 fL (ref 80.0–100.0)
Monocytes Absolute: 0.6 10*3/uL (ref 0.1–1.0)
Monocytes Absolute: 0.5 10*3/uL (ref 0.1–1.0)
Monocytes Absolute: 0.6 10*3/uL (ref 0.1–1.0)
Monocytes Relative: 10 %
Monocytes Relative: 9 %
Monocytes Relative: 11 %
Neutro Abs: 4.9 10*3/uL (ref 1.7–7.7)
Neutro Abs: 4.4 10*3/uL (ref 1.7–7.7)
Neutro Abs: 3.8 10*3/uL (ref 1.7–7.7)
Neutrophils Relative %: 73 %
Neutrophils Relative %: 74 %
Neutrophils Relative %: 70 %
Platelets: 64 10*3/uL — ABNORMAL LOW (ref 150–400)
Platelets: 60 10*3/uL — ABNORMAL LOW (ref 150–400)
Platelets: 60 10*3/uL — ABNORMAL LOW (ref 150–400)
RBC: 2.14 MIL/uL — ABNORMAL LOW (ref 4.22–5.81)
RBC: 2.4 MIL/uL — ABNORMAL LOW (ref 4.22–5.81)
RBC: 2.45 MIL/uL — ABNORMAL LOW (ref 4.22–5.81)
RDW: 17.8 % — ABNORMAL HIGH (ref 11.5–15.5)
RDW: 18.4 % — ABNORMAL HIGH (ref 11.5–15.5)
RDW: 18.5 % — ABNORMAL HIGH (ref 11.5–15.5)
WBC: 6.7 10*3/uL (ref 4.0–10.5)
WBC: 6 10*3/uL (ref 4.0–10.5)
WBC: 5.5 10*3/uL (ref 4.0–10.5)
nRBC: 0 % (ref 0.0–0.2)
nRBC: 0 % (ref 0.0–0.2)
nRBC: 0 % (ref 0.0–0.2)

## 2024-07-16 LAB — BASIC METABOLIC PANEL WITH GFR
Anion gap: 9 (ref 5–15)
BUN: 43 mg/dL — ABNORMAL HIGH (ref 8–23)
CO2: 23 mmol/L (ref 22–32)
Calcium: 8.3 mg/dL — ABNORMAL LOW (ref 8.9–10.3)
Chloride: 109 mmol/L (ref 98–111)
Creatinine, Ser: 1.65 mg/dL — ABNORMAL HIGH (ref 0.61–1.24)
GFR, Estimated: 42 mL/min — ABNORMAL LOW
Glucose, Bld: 138 mg/dL — ABNORMAL HIGH (ref 70–99)
Potassium: 4.2 mmol/L (ref 3.5–5.1)
Sodium: 141 mmol/L (ref 135–145)

## 2024-07-16 LAB — SURGICAL PATHOLOGY

## 2024-07-16 LAB — PREPARE RBC (CROSSMATCH)

## 2024-07-16 MED ORDER — NA SULFATE-K SULFATE-MG SULF 17.5-3.13-1.6 GM/177ML PO SOLN
0.5000 | Freq: Once | ORAL | Status: AC
  Administered 2024-07-16: 177 mL via ORAL
  Filled 2024-07-16: qty 1

## 2024-07-16 MED ORDER — FAMOTIDINE 20 MG PO TABS
40.0000 mg | ORAL_TABLET | Freq: Every day | ORAL | Status: DC
  Administered 2024-07-16 – 2024-07-21 (×6): 40 mg via ORAL
  Filled 2024-07-16 (×6): qty 2

## 2024-07-16 MED ORDER — SODIUM CHLORIDE 0.9 % IV SOLN: INTRAVENOUS | Status: AC

## 2024-07-16 MED ORDER — SIMETHICONE 80 MG PO CHEW
240.0000 mg | CHEWABLE_TABLET | Freq: Once | ORAL | Status: AC
  Administered 2024-07-16: 240 mg via ORAL
  Filled 2024-07-16: qty 3

## 2024-07-16 MED ORDER — CHLORHEXIDINE GLUCONATE CLOTH 2 % EX PADS
6.0000 | MEDICATED_PAD | Freq: Every day | CUTANEOUS | Status: AC
  Administered 2024-07-16 – 2024-07-23 (×8): 6 via TOPICAL

## 2024-07-16 MED ORDER — NA SULFATE-K SULFATE-MG SULF 17.5-3.13-1.6 GM/177ML PO SOLN
0.5000 | Freq: Once | ORAL | Status: AC
  Administered 2024-07-16: 177 mL via ORAL

## 2024-07-16 MED ORDER — BISACODYL 5 MG PO TBEC
10.0000 mg | DELAYED_RELEASE_TABLET | Freq: Once | ORAL | Status: AC
  Administered 2024-07-16: 10 mg via ORAL
  Filled 2024-07-16: qty 2

## 2024-07-16 MED ORDER — SODIUM CHLORIDE 0.9% IV SOLUTION: Freq: Once | INTRAVENOUS | Status: AC

## 2024-07-16 NOTE — Progress Notes (Signed)
 Date and time results received: 07/16/24 0140 (use smartphrase .now to insert current time)  Test: Hemoglobin Critical Value: 6.6  Name of Provider Notified: J. Amilibia DO  Orders Received? Or Actions Taken?: Orders Received - See Orders for details

## 2024-07-16 NOTE — Plan of Care (Signed)
  Problem: Health Behavior/Discharge Planning: Goal: Ability to manage health-related needs will improve Outcome: Progressing   Problem: Clinical Measurements: Goal: Respiratory complications will improve Outcome: Progressing Goal: Cardiovascular complication will be avoided Outcome: Progressing   Problem: Nutrition: Goal: Adequate nutrition will be maintained Outcome: Progressing   

## 2024-07-16 NOTE — Progress Notes (Signed)
" ° ° °  PROCEDURAL EXPEDITER PROGRESS NOTE  Patient Name: [name]  DOB:1946-03-08 Date of Admission: 07/11/2024  Date of Assessment:07/16/24   -------------------------------------------------------------------------------------------------------------------   Brief clinical summary: Pt to Endo tomorrow for colonoscopy  Orders in place:  Yes   Labs, test, and orders reviewed: Y  Requires surgical clearance:  No  Barriers noted: N/A  -------------------------------------------------------------------------------------------------------------------  [facility] Patient Care [employer], [place], [place] Please contact us  directly via secure chat (search for [facility]) or by calling us  at [phone] [facility]).  "

## 2024-07-16 NOTE — Progress Notes (Signed)
 "  HD#3 SUBJECTIVE:  Patient Summary: [name] is a 79 y.o.  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   Overnight Events: Overnight his Hgb levels dropped to 6.6 ~ 0100. He was given 1 unit of pRBCs. No other acute events overnight.  Interim History: History provided by patient and obtained from chart review. On interview, he reports doing alright overnight. He states that his sleep was not the best due being woken up overnight. He endorses no change or worsening of his breathing on 3L [place]. He denies any dyspnea, increased work of breathing, or chest pain at rest. He denies any lightheadedness or dizziness. He continues to endorse having dark, tarry bowel movements without any bright red blood. He denies coughing up any blood or having pain while eating. We discussed his most recent Hgb levels and the need to reconsult GI for their recommendations. He had no further questions or concerns at this time.  OBJECTIVE:  Vital Signs: Vitals:   07/16/24 0522 07/16/24 0745 07/16/24 0804 07/16/24 1057  BP: (!) 121/52 134/68  (!) 107/56  Pulse: 62 60 70 73  Resp: 18 17 18 18   Temp: 99 F (37.2 C) 99.1 F (37.3 C)  99.3 F (37.4 C)  TempSrc: Oral Oral  Oral  SpO2: 100% 100%  100%  Weight:      Height:       Supplemental O2: Nasal Cannula SpO2: 100 % O2 Flow Rate (L/min): 3 L/min FiO2 (%): 32 %  Filed Weights   07/11/24 0522 07/12/24 1624  Weight: 60 kg 61.3 kg     Intake/Output Summary (Last 24 hours) at 07/16/2024 1152 Last data filed at 07/16/2024 0900 Gross per 24 hour  Intake 1313.17 ml  Output 1100 ml  Net 213.17 ml   Net IO Since Admission: -851.83 mL [07/16/24 1152]  Physical  Exam: Vital signs reviewed. General/Constitutional: chronically ill-appearing, sleepy, and cachectic elderly male lying in bed in no acute distress Cardiovascular: irregularly irregular rhythm with normal rate, no murmurs Respiratory: normal work of breathing on home 3L [place], clear to auscultation in bilateral anterior lung fields Abdomen: soft, supple, non-tender, and non-distended, intact bowel sounds Neurological: alert and oriented x3 MSK: no edema noted to BLE Skin: warm and dry Psych: normal mood and affect  Patient Lines/Drains/Airways Status     Active Line/Drains/Airways     Name Placement date Placement time Site Days   Peripheral IV 07/11/24 20 G Left Antecubital 07/11/24  0524  Antecubital  5   Suprapubic Catheter Latex 18 Fr. 07/08/22  1058  Latex  739            Pertinent labs and imaging:      Latest Ref Rng & Units 07/16/2024   10:16 AM 07/16/2024   12:58 AM 07/15/2024    3:42 PM  CBC  WBC 4.0 - 10.5 K/uL 6.0  6.7  5.6   Hemoglobin 13.0 - 17.0 g/dL 7.3  6.6  7.5   Hematocrit 39.0 - 52.0 % 23.4  20.7  24.1   Platelets 150 - 400 K/uL 60  64  70        Latest Ref Rng & Units 07/16/2024   10:16 AM 07/15/2024  6:55 AM 07/14/2024    7:08 AM  CMP  Glucose 70 - 99 mg/dL 861  87  81   BUN 8 - 23 mg/dL 43  44  29   Creatinine 0.61 - 1.24 mg/dL 8.34  8.54  8.50   Sodium 135 - 145 mmol/L 141  139  141   Potassium 3.5 - 5.1 mmol/L 4.2  4.8  4.5   Chloride 98 - 111 mmol/L 109  106  106   CO2 22 - 32 mmol/L 23  27  27    Calcium  8.9 - 10.3 mg/dL 8.3  8.6  8.9     No results found.  ASSESSMENT/PLAN:  Assessment: Principal Problem:   Acute hypoxic respiratory failure (HCC) Active Problems:   Chest pain   Shortness of breath   Erosive gastritis   Plan: #Acute Blood Loss Anemia, 2/2 Erosive Gastritis #Ampullary Adenoma #Thrombocytopenia Patient's Hgb dropped to 6.6 overnight and was given 1 unit of pRBCs. Post-transfusion HgB was 7.3 with platelets of 60.  Patient continues to endorse melena without hematochezia. Currently denying any symptoms stemming from his anemia and his vital signs are largely stable. He has had episodes of hypotension discussed below. His acute drop in Hgb is still likely secondary to his diffuse gastritis of the stomach and duodenal bulb, but may also be stemming from a lower GI source. We will consult GI to see if there is further indication for colonoscopic examination to assess for blood loss. For now, we will also focus on addressing his gastritis as a likely source of his blood loss. We will continue to hold his aspirin  and antiplatelet therapy in light of his Hgb drops as well as discontinue serotonergic agents. - GI consulted, awaiting further recommendations - Continue PO Protonix  40 mg bid x 60 days, then daily - Continue Carafate  1g bid x 30 days - Start oral Pepcid  20 mg bid - Hold home ASA and Plavix  - Hold home Lexapro  d/t ABLA - Hold home Mirtazapine  d/t ABLA - SCDs for DVT ppx - Outpatient GI follow up scheduled with Dr. Avram - CBC q8h - F/u H. Pylori biopsies - F/u duodenal polyp biopsy  #History of Hypertension #Hypotension Patient has a history of labile SBPs ranging from 100s-140s. His home medications include amlodipine  10 mg, Toprol -XL 25 mg, Entresto  97-103 mg, and spironolactone  12.5 mg. His BP has been normotensive with intermittent episodes of hypotension likely in the setting of bleeding. We will encourage PO intake and continue to hold home medication due to hypotension. Will consider restarting antihypertensives when patient's pressures can tolerate. May need small fluid bolus if pressures not holding. - Hold home metoprolol  d/t to bradycardia and hypotension - Hold home amlodipine  and spironolactone  d/t hypotension - Hold home Entresto  d/t hypotension, may restart at half dose if pressures tolerate - 500 cc IVF bolus for hypotension  #Acute on chronic hypoxic respiratory failure #COPD on  3L [place] at home #HFmrEF Patient at baseline oxygen-dependence (3L [place]) without new dyspnea or chest pain. On exam, normal work of breathing with adequate oxygen saturation in the upper 90s. Clear breath sounds auscultated in the anterior lung fields bilaterally but no wheezes. Appears euvolemic on exam without any lower extremity swelling. His difficulty breathing and hypoxia on presentation is likely related to his underlying anemia. Will continue to provide breathing treatments and hold home HF medications. Will consider restarting HF medications if his pressures and heart rates can tolerate. - Hold Entresto  and Spironolactone  d/t hypotension - Hold Toprol -XL  d/t bradycardia - Brovana  Nebulizer 15 mcg bid + Incruse Ellipta    #Chronic Kidney Disease, Stage 3  Patient baseline Cr of 1.5-1.6. Most recent BMP shows elevated Cr 1.65 with elevated BUN of 43. Cr and BUN elevations likely due to decreased oral intake of fluids and break down of blood secondary to blood loss, respectively. Does not meet criteria for AKI at this time but we will continue to monitor. - Trend BMPs  #Persistent Afib not on [facility]  Patient is in atrial fibrillation with rate control (metoprolol  succinate 25 mg daily). His CHA2DS2-VASc score is 5. Currently not on any anticoagulation due to history of prior GI bleeding on Xarelto . Rates have been controlled without medication. Currently asymptomatic. If his rates increase, will plan to restart metoprolol  12.5 mg daily. - Continuous cardiac monitoring - Hold home metoprolol  d/t to hypotension and bradycardia  #Possible Aortic EndoLeak seen on CTA #Aortic Aneurysm s/p repair Given patient's dyspnea, chest pain, and acute blood loss anemia, vascular surgery previously consulted for possible aortic endoleak seen on CTA. Dr. Pearline reviewed the case and has no concern for dissection or worsening AAA. He recommends follow up outpatient (scheduled for 07/26/24). Patient denies any new chest  pain or dyspnea since admission. Will continue to monitor patient's status. - Hold home ASA and antiplatelet therapy d/t recent Hgb drop - Continue home rosuvastatin   #Prior NSTEMI #Aortic atherosclerosis Patient denies any new chest pain and dyspnea overnight. Low concern for ACS but will continue to monitor given patient's risk factors.  - Continuous cardiac monitoring - For new chest pain, stat EKG and troponins - Hold home ASA and antiplatelet therapy d/t ABLA - Continue home rosuvastatin   #Concern for Malnutrition #Cachexia Patient remains chronically ill-appearing with cachexia (BMI 18.33). Cachexia likely in the setting of known prostate adenocarcinoma + emphysema/COPD. His Ampulla of Vater adenoma may also be contributing if there has been malignant transformation. Further workup of gastritis and ampullary adenoma in outpatient setting (discussed above) - Ensure supplements bid b/w meals - Discontinue home mirtazapine  15 mg nightly d/t ABLA  Chronic Stable Medical Conditions   #Chronic Hip Pain - continue home Norco #TUD - continue nicotine  patches #GERD - see plan above for ABLA #HLD - continue home rosuvastatin  #Depression - continue home escitalopram   Best Practice: Diet: Heart Healthy IVF: Fluids: None, Rate: None VTE: Place and maintain sequential compression device Start: 07/12/24 1131 Code: Full  Disposition planning: Therapy Recs: None, DME: none Family Contact: sister, to be notified. DISPO: Anticipated discharge in 1-2 days to Home pending stabilization of bleeding.  Signature:  Elia LULLA Tobie Jolynn [facility]  11:52 AM, 07/16/2024  On Call pager [phone]  Attestation for Student Documentation:  I personally was present and re-performed the history, physical exam and medical decision-making activities of this service and have verified that the service and findings are accurately documented in the students note.  Waymond Cart,  MD 07/16/2024, 2:05 PM   "

## 2024-07-17 ENCOUNTER — Encounter (HOSPITAL_COMMUNITY): Admission: EM | Payer: Self-pay | Source: Home / Self Care | Attending: Internal Medicine

## 2024-07-17 ENCOUNTER — Inpatient Hospital Stay (HOSPITAL_COMMUNITY): Admitting: Anesthesiology

## 2024-07-17 ENCOUNTER — Encounter (HOSPITAL_COMMUNITY): Payer: Self-pay | Admitting: Internal Medicine

## 2024-07-17 ENCOUNTER — Ambulatory Visit: Payer: Self-pay | Admitting: Gastroenterology

## 2024-07-17 DIAGNOSIS — D696 Thrombocytopenia, unspecified: Secondary | ICD-10-CM | POA: Diagnosis not present

## 2024-07-17 DIAGNOSIS — K921 Melena: Secondary | ICD-10-CM

## 2024-07-17 DIAGNOSIS — D128 Benign neoplasm of rectum: Secondary | ICD-10-CM

## 2024-07-17 DIAGNOSIS — J9621 Acute and chronic respiratory failure with hypoxia: Secondary | ICD-10-CM | POA: Diagnosis not present

## 2024-07-17 DIAGNOSIS — D124 Benign neoplasm of descending colon: Secondary | ICD-10-CM

## 2024-07-17 DIAGNOSIS — Z1211 Encounter for screening for malignant neoplasm of colon: Secondary | ICD-10-CM

## 2024-07-17 DIAGNOSIS — I959 Hypotension, unspecified: Secondary | ICD-10-CM | POA: Diagnosis not present

## 2024-07-17 DIAGNOSIS — I10 Essential (primary) hypertension: Secondary | ICD-10-CM

## 2024-07-17 DIAGNOSIS — D120 Benign neoplasm of cecum: Secondary | ICD-10-CM

## 2024-07-17 DIAGNOSIS — J449 Chronic obstructive pulmonary disease, unspecified: Secondary | ICD-10-CM | POA: Diagnosis not present

## 2024-07-17 DIAGNOSIS — K219 Gastro-esophageal reflux disease without esophagitis: Secondary | ICD-10-CM | POA: Diagnosis not present

## 2024-07-17 DIAGNOSIS — I2510 Atherosclerotic heart disease of native coronary artery without angina pectoris: Secondary | ICD-10-CM

## 2024-07-17 DIAGNOSIS — Z860101 Personal history of adenomatous and serrated colon polyps: Secondary | ICD-10-CM

## 2024-07-17 DIAGNOSIS — I5020 Unspecified systolic (congestive) heart failure: Secondary | ICD-10-CM | POA: Diagnosis not present

## 2024-07-17 DIAGNOSIS — I252 Old myocardial infarction: Secondary | ICD-10-CM | POA: Diagnosis not present

## 2024-07-17 DIAGNOSIS — D122 Benign neoplasm of ascending colon: Secondary | ICD-10-CM

## 2024-07-17 DIAGNOSIS — N183 Chronic kidney disease, stage 3 unspecified: Secondary | ICD-10-CM | POA: Diagnosis not present

## 2024-07-17 DIAGNOSIS — I4819 Other persistent atrial fibrillation: Secondary | ICD-10-CM | POA: Diagnosis not present

## 2024-07-17 DIAGNOSIS — D123 Benign neoplasm of transverse colon: Secondary | ICD-10-CM

## 2024-07-17 DIAGNOSIS — I719 Aortic aneurysm of unspecified site, without rupture: Secondary | ICD-10-CM | POA: Diagnosis not present

## 2024-07-17 DIAGNOSIS — E785 Hyperlipidemia, unspecified: Secondary | ICD-10-CM | POA: Diagnosis not present

## 2024-07-17 DIAGNOSIS — D62 Acute posthemorrhagic anemia: Secondary | ICD-10-CM | POA: Diagnosis not present

## 2024-07-17 DIAGNOSIS — D125 Benign neoplasm of sigmoid colon: Secondary | ICD-10-CM

## 2024-07-17 DIAGNOSIS — K641 Second degree hemorrhoids: Secondary | ICD-10-CM

## 2024-07-17 LAB — CBC WITH DIFFERENTIAL/PLATELET
Abs Immature Granulocytes: 0.03 10*3/uL (ref 0.00–0.07)
Abs Immature Granulocytes: 0.02 10*3/uL (ref 0.00–0.07)
Abs Immature Granulocytes: 0.03 10*3/uL (ref 0.00–0.07)
Basophils Absolute: 0 10*3/uL (ref 0.0–0.1)
Basophils Absolute: 0 10*3/uL (ref 0.0–0.1)
Basophils Absolute: 0 10*3/uL (ref 0.0–0.1)
Basophils Relative: 1 %
Basophils Relative: 1 %
Basophils Relative: 1 %
Eosinophils Absolute: 0.1 10*3/uL (ref 0.0–0.5)
Eosinophils Absolute: 0.1 10*3/uL (ref 0.0–0.5)
Eosinophils Absolute: 0.1 10*3/uL (ref 0.0–0.5)
Eosinophils Relative: 2 %
Eosinophils Relative: 2 %
Eosinophils Relative: 2 %
HCT: 25 % — ABNORMAL LOW (ref 39.0–52.0)
HCT: 25.7 % — ABNORMAL LOW (ref 39.0–52.0)
HCT: 25.9 % — ABNORMAL LOW (ref 39.0–52.0)
Hemoglobin: 7.7 g/dL — ABNORMAL LOW (ref 13.0–17.0)
Hemoglobin: 7.9 g/dL — ABNORMAL LOW (ref 13.0–17.0)
Hemoglobin: 8.2 g/dL — ABNORMAL LOW (ref 13.0–17.0)
Immature Granulocytes: 1 %
Immature Granulocytes: 0 %
Immature Granulocytes: 0 %
Lymphocytes Relative: 15 %
Lymphocytes Relative: 21 %
Lymphocytes Relative: 17 %
Lymphs Abs: 0.8 10*3/uL (ref 0.7–4.0)
Lymphs Abs: 1.1 10*3/uL (ref 0.7–4.0)
Lymphs Abs: 1.3 10*3/uL (ref 0.7–4.0)
MCH: 30.1 pg (ref 26.0–34.0)
MCH: 30.9 pg (ref 26.0–34.0)
MCH: 31.1 pg (ref 26.0–34.0)
MCHC: 30.8 g/dL (ref 30.0–36.0)
MCHC: 30.7 g/dL (ref 30.0–36.0)
MCHC: 31.7 g/dL (ref 30.0–36.0)
MCV: 97.7 fL (ref 80.0–100.0)
MCV: 100.4 fL — ABNORMAL HIGH (ref 80.0–100.0)
MCV: 98.1 fL (ref 80.0–100.0)
Monocytes Absolute: 0.6 10*3/uL (ref 0.1–1.0)
Monocytes Absolute: 0.5 10*3/uL (ref 0.1–1.0)
Monocytes Absolute: 0.6 10*3/uL (ref 0.1–1.0)
Monocytes Relative: 10 %
Monocytes Relative: 10 %
Monocytes Relative: 7 %
Neutro Abs: 4.2 10*3/uL (ref 1.7–7.7)
Neutro Abs: 3.2 10*3/uL (ref 1.7–7.7)
Neutro Abs: 5.6 10*3/uL (ref 1.7–7.7)
Neutrophils Relative %: 71 %
Neutrophils Relative %: 66 %
Neutrophils Relative %: 73 %
Platelets: 65 10*3/uL — ABNORMAL LOW (ref 150–400)
Platelets: 60 10*3/uL — ABNORMAL LOW (ref 150–400)
Platelets: 70 10*3/uL — ABNORMAL LOW (ref 150–400)
RBC: 2.56 MIL/uL — ABNORMAL LOW (ref 4.22–5.81)
RBC: 2.56 MIL/uL — ABNORMAL LOW (ref 4.22–5.81)
RBC: 2.64 MIL/uL — ABNORMAL LOW (ref 4.22–5.81)
RDW: 18.6 % — ABNORMAL HIGH (ref 11.5–15.5)
RDW: 18.6 % — ABNORMAL HIGH (ref 11.5–15.5)
RDW: 18.2 % — ABNORMAL HIGH (ref 11.5–15.5)
Smear Review: NORMAL
WBC: 5.8 10*3/uL (ref 4.0–10.5)
WBC: 4.9 10*3/uL (ref 4.0–10.5)
WBC: 7.6 10*3/uL (ref 4.0–10.5)
nRBC: 2.1 % — ABNORMAL HIGH (ref 0.0–0.2)
nRBC: 0 % (ref 0.0–0.2)
nRBC: 0 % (ref 0.0–0.2)

## 2024-07-17 LAB — BASIC METABOLIC PANEL WITH GFR
Anion gap: 10 (ref 5–15)
BUN: 35 mg/dL — ABNORMAL HIGH (ref 8–23)
CO2: 22 mmol/L (ref 22–32)
Calcium: 8.7 mg/dL — ABNORMAL LOW (ref 8.9–10.3)
Chloride: 108 mmol/L (ref 98–111)
Creatinine, Ser: 1.41 mg/dL — ABNORMAL HIGH (ref 0.61–1.24)
GFR, Estimated: 51 mL/min — ABNORMAL LOW
Glucose, Bld: 74 mg/dL (ref 70–99)
Potassium: 4.6 mmol/L (ref 3.5–5.1)
Sodium: 140 mmol/L (ref 135–145)

## 2024-07-17 MED ORDER — PROPOFOL 10 MG/ML IV BOLUS
INTRAVENOUS | Status: DC | PRN
  Administered 2024-07-17: 40 mg via INTRAVENOUS

## 2024-07-17 MED ORDER — PROPOFOL 500 MG/50ML IV EMUL
INTRAVENOUS | Status: DC | PRN
  Administered 2024-07-17: 100 ug/kg/min via INTRAVENOUS

## 2024-07-17 MED ORDER — LIDOCAINE 2% (20 MG/ML) 5 ML SYRINGE
INTRAMUSCULAR | Status: DC | PRN
  Administered 2024-07-17: 50 mg via INTRAVENOUS

## 2024-07-17 MED ORDER — PHENYLEPHRINE 80 MCG/ML (10ML) SYRINGE FOR IV PUSH (FOR BLOOD PRESSURE SUPPORT)
PREFILLED_SYRINGE | INTRAVENOUS | Status: DC | PRN
  Administered 2024-07-17 (×3): 80 ug via INTRAVENOUS

## 2024-07-17 MED ORDER — FLEET ENEMA RE ENEM
1.0000 | ENEMA | Freq: Once | RECTAL | Status: AC
  Administered 2024-07-17: 1 via RECTAL
  Filled 2024-07-17: qty 1

## 2024-07-17 NOTE — Interval H&P Note (Signed)
 History and Physical Interval Note:  07/17/2024 10:09 AM  [name] Her  has presented today for surgery, with the diagnosis of Melena, anemia, history of multiple adenomatous polyps.  The various methods of treatment have been discussed with the patient and family. After consideration of risks, benefits and other options for treatment, the patient has consented to  Procedures: COLONOSCOPY (N/A) as a surgical intervention.  The patient's history has been reviewed, patient examined, no change in status, stable for surgery.  I have reviewed the patient's chart and labs.  Questions were answered to the patient's satisfaction.     [name]

## 2024-07-17 NOTE — Anesthesia Postprocedure Evaluation (Signed)
"   Anesthesia Post Note  Patient: [name] Her  Procedure(s) Performed: COLONOSCOPY POLYPECTOMY, INTESTINE     Patient location during evaluation: PACU Anesthesia Type: MAC Level of consciousness: awake and alert Pain management: pain level controlled Vital Signs Assessment: post-procedure vital signs reviewed and stable Respiratory status: spontaneous breathing, nonlabored ventilation, respiratory function stable and patient connected to nasal cannula oxygen Cardiovascular status: stable and blood pressure returned to baseline Postop Assessment: no apparent nausea or vomiting Anesthetic complications: no   No notable events documented.  Last Vitals:  Vitals:   07/17/24 1145 07/17/24 1200  BP: (!) 114/46 109/69  Pulse: 64 65  Resp: (!) 21 19  Temp:  36.7 C  SpO2: 100% 100%    Last Pain:  Vitals:   07/17/24 1607  TempSrc:   PainSc: 4                  [name]      "

## 2024-07-17 NOTE — Anesthesia Preprocedure Evaluation (Signed)
"                                    Anesthesia Evaluation  Patient identified by MRN, date of birth, ID band Patient awake    Reviewed: Allergy & Precautions, H&P , NPO status , Patient's Chart, lab work & pertinent test results  History of Anesthesia Complications Negative for: history of anesthetic complications  Airway Mallampati: II  TM Distance: >3 FB Neck ROM: Full    Dental no notable dental hx.    Pulmonary COPD, Current Smoker Hx of squamous cell carcinoma s/p surgical resection   Pulmonary exam normal breath sounds clear to auscultation       Cardiovascular hypertension, + CAD, + Past MI, + Cardiac Stents and +CHF  Normal cardiovascular exam Rhythm:Regular Rate:Normal  04/2024: IMPRESSIONS     1. Left ventricular ejection fraction, by estimation, is 50 to 55%. The  left ventricle has low normal function. The left ventricle has no regional  wall motion abnormalities. There is mild left ventricular hypertrophy.  Left ventricular diastolic  parameters are indeterminate.   2. Right ventricular systolic function is normal. The right ventricular  size is mildly enlarged. There is moderately elevated pulmonary artery  systolic pressure. The estimated right ventricular systolic pressure is  54.6 mmHg.   3. Left atrial size was severely dilated.   4. Right atrial size was moderately dilated.   5. The mitral valve is normal in structure. Mild mitral valve  regurgitation. No evidence of mitral stenosis.   6. Tricuspid valve regurgitation is moderate.   7. The aortic valve is normal in structure. Aortic valve regurgitation is  mild. No aortic stenosis is present.   8. The inferior vena cava is normal in size with greater than 50%  respiratory variability, suggesting right atrial pressure of 3 mmHg.     Neuro/Psych neg Seizures negative neurological ROS  negative psych ROS   GI/Hepatic hiatal hernia, PUD,GERD  ,,(+)     substance abuse  alcohol use   Endo/Other  negative endocrine ROS    Renal/GU CRFRenal disease  negative genitourinary   Musculoskeletal negative musculoskeletal ROS (+)    Abdominal   Peds negative pediatric ROS (+)  Hematology negative hematology ROS (+)   Anesthesia Other Findings   Reproductive/Obstetrics negative OB ROS                              Anesthesia Physical Anesthesia Plan  ASA: 3  Anesthesia Plan: MAC   Post-op Pain Management:    Induction: Intravenous  PONV Risk Score and Plan: 0 and Propofol  infusion and Treatment may vary due to age or medical condition  Airway Management Planned: Natural Airway  Additional Equipment:   Intra-op Plan:   Post-operative Plan:   Informed Consent: I have reviewed the patients History and Physical, chart, labs and discussed the procedure including the risks, benefits and alternatives for the proposed anesthesia with the patient or authorized representative who has indicated his/her understanding and acceptance.     Dental advisory given  Plan Discussed with: CRNA  Anesthesia Plan Comments:          Anesthesia Quick Evaluation  "

## 2024-07-17 NOTE — Transfer of Care (Signed)
 Immediate Anesthesia Transfer of Care Note  Patient: [name] Her  Procedure(s) Performed: COLONOSCOPY POLYPECTOMY, INTESTINE  Patient Location: PACU  Anesthesia Type:MAC  Level of Consciousness: drowsy  Airway & Oxygen Therapy: Patient Spontanous Breathing and Patient connected to nasal cannula oxygen  Post-op Assessment: Report given to RN and Post -op Vital signs reviewed and stable  Post vital signs: Reviewed and stable  Last Vitals:  Vitals Value Taken Time  BP 105/63 07/17/24 11:36  Temp    Pulse 63   Resp 10 07/17/24 11:37  SpO2 98   Vitals shown include unfiled device data.  Last Pain:  Vitals:   07/17/24 1015  TempSrc: Temporal  PainSc: 0-No pain         Complications: No notable events documented.

## 2024-07-17 NOTE — Progress Notes (Addendum)
 "  HD#4 SUBJECTIVE:  Patient Summary: [name] is a 79 y.o.  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   Overnight Events: Hb stable at 7.7.  Interim History: Seen at bedside. Scheduled for colonoscopy, RN having at bedside. Inadequate bowel prep, GI giving additional bowel prep. Denies chest pain or SOB.  Continues to have melenic stools.  OBJECTIVE:  Vital Signs: Vitals:   07/17/24 1015 07/17/24 1140 07/17/24 1145 07/17/24 1200  BP: (!) 159/79 105/63 (!) 114/46 109/69  Pulse: 68  64 65  Resp: 17 20 (!) 21 19  Temp: (!) 97 F (36.1 C) 97.9 F (36.6 C)  98 F (36.7 C)  TempSrc: Temporal     SpO2: 100% 98% 100% 100%  Weight:      Height:       Supplemental O2: Nasal Cannula SpO2: 100 % O2 Flow Rate (L/min): 3 L/min FiO2 (%): 32 %  Filed Weights   07/11/24 0522 07/12/24 1624  Weight: 60 kg 61.3 kg     Intake/Output Summary (Last 24 hours) at 07/17/2024 1215 Last data filed at 07/17/2024 1200 Gross per 24 hour  Intake 811.53 ml  Output 1400 ml  Net -588.47 ml   Net IO Since Admission: -1,940.3 mL [07/17/24 1215]  Physical Exam:  General/Constitutional: chronically ill-appearing, sleepy, and cachectic elderly male lying in bed in no acute distress Cardiovascular: irregularly irregular rhythm with normal rate, no murmurs Respiratory: normal work of breathing on home 3L [place], clear to auscultation in bilateral anterior lung fields Abdomen: soft, supple, non-tender, and non-distended, intact bowel sounds Neurological: alert and oriented x3 MSK: no edema noted to BLE Skin: warm and dry Psych: normal mood and affect  Patient Lines/Drains/Airways Status     Active  Line/Drains/Airways     Name Placement date Placement time Site Days   Peripheral IV 07/11/24 20 G Left Antecubital 07/11/24  0524  Antecubital  6   Suprapubic Catheter Latex 18 Fr. 07/08/22  1058  Latex  740            Pertinent labs and imaging:      Latest Ref Rng & Units 07/17/2024    9:47 AM 07/17/2024    2:33 AM 07/16/2024    2:52 PM  CBC  WBC 4.0 - 10.5 K/uL 4.9  5.8  5.5   Hemoglobin 13.0 - 17.0 g/dL 7.9  7.7  7.4   Hematocrit 39.0 - 52.0 % 25.7  25.0  23.8   Platelets 150 - 400 K/uL 60  65  60        Latest Ref Rng & Units 07/17/2024    9:47 AM 07/16/2024   10:16 AM 07/15/2024    6:55 AM  CMP  Glucose 70 - 99 mg/dL 74  861  87   BUN 8 - 23 mg/dL 35  43  44   Creatinine 0.61 - 1.24 mg/dL 8.58  8.34  8.54   Sodium 135 - 145 mmol/L 140  141  139   Potassium 3.5 - 5.1 mmol/L 4.6  4.2  4.8   Chloride 98 - 111 mmol/L 108  109  106   CO2 22 - 32 mmol/L 22  23  27  Calcium  8.9 - 10.3 mg/dL 8.7  8.3  8.6     No results found.  ASSESSMENT/PLAN:  Assessment: Principal Problem:   Acute hypoxic respiratory failure (HCC) Active Problems:   Chest pain   Shortness of breath   Erosive gastritis   Acute blood loss anemia   Antiplatelet or antithrombotic long-term use   Transfusion-dependent anemia   Polyposis of colon   Acute on chronic anemia  Plan: #Acute Blood Loss Anemia, 2/2 Erosive Gastritis #Ampullary Adenoma #Thrombocytopenia Hb stabilized at 7.7, patient continues to endorse melena without hematochezia.  Not endorsing shortness of breath or chest pain.  Ongoing anemia with intermittent blood transfusion, this is likely secondary to diffuse gastritis in the stomach and duodenal bulb.   -Colonoscopy completed today, no bleeding noted, 2-7 polyp that were resected, nonbleeding internal/external hemorrhoids.  Large amount of semiliquid stool was found in the entire colon, interfering with visualization.  Tentative plan for video capsule tomorrow 2/1 - H&H Q8hrs,  transfuse Hb<7. - Resume regular diet until dinner - Clear liquid diet at dinner, NPO @Midnight . -Continue Pepcid , Carafate , Protonix . - Holding Lexapro  and mirtazapine  due to ABLA. - SCDs for DVT prophylaxis  #History of Hypertension #Hypotension Patient has a history of labile SBPs ranging from 100s-140s. His home medications include amlodipine  10 mg, Toprol -XL 25 mg, Entresto  97-103 mg, and spironolactone  12.5 mg. His BP has been normotensive with intermittent episodes of hypotension likely in the setting of bleeding. We will encourage PO intake and continue to hold home medication due to hypotension. Will consider restarting antihypertensives when patient's pressures can tolerate. May need small fluid bolus if pressures not holding. - Hold home metoprolol  d/t to bradycardia and hypotension - Hold home amlodipine  and spironolactone  d/t hypotension - Hold home Entresto  d/t hypotension, may restart at half dose if pressures tolerate   #Acute on chronic hypoxic respiratory failure #COPD on 3L [place] at home #HFmrEF Patient at baseline oxygen-dependence (3L [place]) without new dyspnea or chest pain. On exam, normal work of breathing with adequate oxygen saturation in the upper 90s. Clear breath sounds auscultated in the anterior lung fields bilaterally but no wheezes. Appears euvolemic on exam without any lower extremity swelling. His difficulty breathing and hypoxia on presentation is likely related to his underlying anemia. Will continue to provide breathing treatments and hold home HF medications. Will consider restarting HF medications if his pressures and heart rates can tolerate. - Hold Entresto  and Spironolactone  d/t hypotension - Hold Toprol -XL d/t bradycardia - Brovana  Nebulizer 15 mcg bid + Incruse Ellipta    #Chronic Kidney Disease, Stage 3  Patient baseline Cr of 1.5-1.6.  BUN and SCR improving, SCr within baseline kidney function.   - Trend BMPs  #Persistent Afib not on [facility]  Patient is  in atrial fibrillation with rate control (metoprolol  succinate 25 mg daily). His CHA2DS2-VASc score is 5. Currently not on any anticoagulation due to history of prior GI bleeding on Xarelto . Rates have been controlled without medication. Currently asymptomatic. If his rates increase, will plan to restart metoprolol  12.5 mg daily. - Continuous cardiac monitoring - Hold home metoprolol  d/t to hypotension and bradycardia  #Possible Aortic EndoLeak seen on CTA #Aortic Aneurysm s/p repair Given patient's dyspnea, chest pain, and acute blood loss anemia, vascular surgery previously consulted for possible aortic endoleak seen on CTA. Dr. Pearline reviewed the case and has no concern for dissection or worsening AAA. He recommends follow up outpatient (scheduled for 07/26/24). Patient denies any new chest pain or dyspnea since admission. Will  continue to monitor patient's status. - Hold home ASA and antiplatelet therapy d/t recent Hgb drop - Continue home rosuvastatin   #Prior NSTEMI #Aortic atherosclerosis Patient denies any new chest pain and dyspnea overnight. Low concern for ACS but will continue to monitor given patient's risk factors.  - Continuous cardiac monitoring - For new chest pain, stat EKG and troponins - Hold home ASA and antiplatelet therapy d/t ABLA - Continue home rosuvastatin   #Concern for Malnutrition #Cachexia Patient remains chronically ill-appearing with cachexia (BMI 18.33). Cachexia likely in the setting of known prostate adenocarcinoma + emphysema/COPD. His Ampulla of Vater adenoma may also be contributing if there has been malignant transformation. Further workup of gastritis and ampullary adenoma in outpatient setting (discussed above) - Ensure supplements bid b/w meals - Hold mirtazapine  15 mg nightly d/t ABLA  Chronic Stable Medical Conditions   #Chronic Hip Pain - continue home Norco #TUD - continue nicotine  patches #GERD - see plan above for ABLA #HLD - continue home  rosuvastatin  #Depression - continue home escitalopram   Best Practice: Diet: Heart Healthy IVF: Fluids: None, Rate: None VTE: Place and maintain sequential compression device Start: 07/12/24 1131 Code: Full  Disposition planning: Therapy Recs: None, DME: none Family Contact: sister, to be notified. DISPO: Anticipated discharge in 1-2 days to Home pending stabilization of bleeding.  Signature: Lorance Pickeral, M.D.  Internal Medicine Resident, PGY-2  12:24 PM, 07/17/2024  "

## 2024-07-17 NOTE — Op Note (Signed)
 [facility] Patient Name: [name] Procedure Date : 07/17/2024 MRN: [id number] Attending MD: Aloha Finner , MD, [id number] Date of Birth: 07-28-1945 CSN: [id number] Age: 79 Admit Type: Inpatient Procedure:                Colonoscopy Indications:              Heme positive stool, Melena Providers:                Aloha Finner, MD, Felice Sar, Technician,                            Robie Breed, RN Referring MD:              Medicines:                Monitored Anesthesia Care Complications:            No immediate complications. Estimated Blood Loss:     Estimated blood loss was minimal. Procedure:                Pre-Anesthesia Assessment:                           - Prior to the procedure, a History and Physical                            was performed, and patient medications and                            allergies were reviewed. The patient's tolerance of                            previous anesthesia was also reviewed. The risks                            and benefits of the procedure and the sedation                            options and risks were discussed with the patient.                            All questions were answered, and informed consent                            was obtained. Prior Anticoagulants: The patient has                            taken Plavix  (clopidogrel ), last dose was 5 days                            prior to procedure. ASA Grade Assessment: III - A                            patient with severe systemic disease. After  reviewing the risks and benefits, the patient was                            deemed in satisfactory condition to undergo the                            procedure.                           After obtaining informed consent, the colonoscope                            was passed under direct vision. Throughout the                            procedure, the patient's blood  pressure, pulse, and                            oxygen saturations were monitored continuously. The                            CF-HQ190L ([id number] Olympus colonoscope was                            introduced through the anus and advanced to the the                            cecum, identified by appendiceal orifice and                            ileocecal valve. The colonoscopy was performed                            without difficulty. The patient tolerated the                            procedure. The quality of the bowel preparation was                            fair. The ileocecal valve, appendiceal orifice, and                            rectum were photographed. Scope In: 11:00:59 AM Scope Out: 11:30:25 AM Scope Withdrawal Time: 0 hours 22 minutes 8 seconds  Total Procedure Duration: 0 hours 29 minutes 26 seconds  Findings:      The digital rectal exam findings include hemorrhoids. Pertinent       negatives include no palpable rectal lesions.      A large amount of semi-liquid stool was found in the entire colon,       interfering with visualization. Lavage of the area was performed using       copious amounts, resulting in clearance with fair visualization.      Nine sessile polyps were found in the rectum (1) , sigmoid colon (1),       descending colon (1), transverse colon (  2), hepatic flexure (1),       ascending colon (1) and cecum (2). The polyps were 2 to 7 mm in size.       These polyps were removed with a cold snare. Resection and retrieval       were complete.      Non-bleeding non-thrombosed external and internal hemorrhoids were found       during retroflexion, during perianal exam and during digital exam. The       hemorrhoids were Grade II (internal hemorrhoids that prolapse but reduce       spontaneously). Impression:               - Preparation of the colon was fair.                           - Hemorrhoids found on digital rectal exam.                            - Stool in the entire examined colon.                           - Nine 2 to 7 mm polyps in the rectum, in the                            sigmoid colon, in the descending colon, in the                            transverse colon, at the hepatic flexure, in the                            ascending colon and in the cecum, removed with a                            cold snare. Resected and retrieved.                           - Non-bleeding non-thrombosed external and internal                            hemorrhoids.                           - No overt signs of any etiology for melanic stools                            noted on today's examination. Recommendation:           - The patient will be observed post-procedure,                            until all discharge criteria are met.                           - Return patient to hospital ward for ongoing care.                           -  Patient has a contact number available for                            emergencies. The signs and symptoms of potential                            delayed complications were discussed with the                            patient. Return to normal activities tomorrow.                            Written discharge instructions were provided to the                            patient.                           - Resume regular diet.                           - Continue present medications.                           - Await pathology results.                           - Repeat colonoscopy because the bowel preparation                            was poor.                           - Will discuss and likely proceed with VCE tomorrow                            (pending availability of recording device) as a                            result of the patient's ongoing transfusion                            dependence and elevated BUN.                           - Regular diet until Dinner and then Full Liquid                             diet at dinner and then NPO @0000 .                           - The findings and recommendations were discussed                            with the patient.                           -  The findings and recommendations were discussed                            with the referring physician. Procedure Code(s):        --- Professional ---                           [phone], Colonoscopy, flexible; with removal of                            tumor(s), polyp(s), or other lesion(s) by snare                            technique Diagnosis Code(s):        --- Professional ---                           K64.1, Second degree hemorrhoids                           D12.8, Benign neoplasm of rectum                           D12.5, Benign neoplasm of sigmoid colon                           D12.4, Benign neoplasm of descending colon                           D12.3, Benign neoplasm of transverse colon (hepatic                            flexure or splenic flexure)                           D12.2, Benign neoplasm of ascending colon                           D12.0, Benign neoplasm of cecum                           R19.5, Other fecal abnormalities                           K92.1, Melena (includes Hematochezia) CPT copyright 2022 [employer]. All rights reserved. The codes documented in this report are preliminary and upon coder review may  be revised to meet current compliance requirements. Aloha Finner, MD 07/17/2024 11:51:15 AM Number of Addenda: 0

## 2024-07-17 NOTE — Anesthesia Procedure Notes (Signed)
 Procedure Name: MAC Date/Time: 07/17/2024 10:52 AM  Performed by: Loreli Blima LABOR, CRNAPre-anesthesia Checklist: Patient identified, Emergency Drugs available, Suction available, Patient being monitored and Timeout performed Patient Re-evaluated:Patient Re-evaluated prior to induction Oxygen Delivery Method: Nasal cannula Placement Confirmation: positive ETCO2

## 2024-07-18 ENCOUNTER — Encounter (HOSPITAL_COMMUNITY): Admission: EM | Payer: Self-pay | Source: Home / Self Care | Attending: Internal Medicine

## 2024-07-18 ENCOUNTER — Encounter (HOSPITAL_COMMUNITY): Payer: Self-pay | Admitting: Gastroenterology

## 2024-07-18 DIAGNOSIS — K922 Gastrointestinal hemorrhage, unspecified: Secondary | ICD-10-CM | POA: Diagnosis not present

## 2024-07-18 DIAGNOSIS — K921 Melena: Secondary | ICD-10-CM | POA: Diagnosis not present

## 2024-07-18 DIAGNOSIS — J9621 Acute and chronic respiratory failure with hypoxia: Secondary | ICD-10-CM | POA: Diagnosis not present

## 2024-07-18 DIAGNOSIS — D62 Acute posthemorrhagic anemia: Secondary | ICD-10-CM | POA: Diagnosis not present

## 2024-07-18 DIAGNOSIS — D696 Thrombocytopenia, unspecified: Secondary | ICD-10-CM | POA: Diagnosis not present

## 2024-07-18 DIAGNOSIS — I959 Hypotension, unspecified: Secondary | ICD-10-CM | POA: Diagnosis not present

## 2024-07-18 LAB — CBC WITH DIFFERENTIAL/PLATELET
Abs Immature Granulocytes: 0.03 10*3/uL (ref 0.00–0.07)
Abs Immature Granulocytes: 0.02 10*3/uL (ref 0.00–0.07)
Abs Immature Granulocytes: 0.02 10*3/uL (ref 0.00–0.07)
Basophils Absolute: 0 10*3/uL (ref 0.0–0.1)
Basophils Absolute: 0 10*3/uL (ref 0.0–0.1)
Basophils Absolute: 0 10*3/uL (ref 0.0–0.1)
Basophils Relative: 0 %
Basophils Relative: 0 %
Basophils Relative: 0 %
Eosinophils Absolute: 0 10*3/uL (ref 0.0–0.5)
Eosinophils Absolute: 0.1 10*3/uL (ref 0.0–0.5)
Eosinophils Absolute: 0 10*3/uL (ref 0.0–0.5)
Eosinophils Relative: 1 %
Eosinophils Relative: 1 %
Eosinophils Relative: 1 %
HCT: 20.9 % — ABNORMAL LOW (ref 39.0–52.0)
HCT: 23.1 % — ABNORMAL LOW (ref 39.0–52.0)
HCT: 18 % — ABNORMAL LOW (ref 39.0–52.0)
Hemoglobin: 6.6 g/dL — CL (ref 13.0–17.0)
Hemoglobin: 7.3 g/dL — ABNORMAL LOW (ref 13.0–17.0)
Hemoglobin: 5.8 g/dL — CL (ref 13.0–17.0)
Immature Granulocytes: 0 %
Immature Granulocytes: 0 %
Immature Granulocytes: 0 %
Lymphocytes Relative: 9 %
Lymphocytes Relative: 20 %
Lymphocytes Relative: 16 %
Lymphs Abs: 0.6 10*3/uL — ABNORMAL LOW (ref 0.7–4.0)
Lymphs Abs: 1.4 10*3/uL (ref 0.7–4.0)
Lymphs Abs: 0.9 10*3/uL (ref 0.7–4.0)
MCH: 31 pg (ref 26.0–34.0)
MCH: 30.5 pg (ref 26.0–34.0)
MCH: 28.9 pg (ref 26.0–34.0)
MCHC: 31.6 g/dL (ref 30.0–36.0)
MCHC: 31.6 g/dL (ref 30.0–36.0)
MCHC: 32.2 g/dL (ref 30.0–36.0)
MCV: 98.1 fL (ref 80.0–100.0)
MCV: 96.7 fL (ref 80.0–100.0)
MCV: 89.6 fL (ref 80.0–100.0)
Monocytes Absolute: 0.4 10*3/uL (ref 0.1–1.0)
Monocytes Absolute: 0.4 10*3/uL (ref 0.1–1.0)
Monocytes Absolute: 0.4 10*3/uL (ref 0.1–1.0)
Monocytes Relative: 6 %
Monocytes Relative: 5 %
Monocytes Relative: 7 %
Neutro Abs: 5.8 10*3/uL (ref 1.7–7.7)
Neutro Abs: 5.1 10*3/uL (ref 1.7–7.7)
Neutro Abs: 4.1 10*3/uL (ref 1.7–7.7)
Neutrophils Relative %: 84 %
Neutrophils Relative %: 74 %
Neutrophils Relative %: 76 %
Platelets: 59 10*3/uL — ABNORMAL LOW (ref 150–400)
Platelets: 62 10*3/uL — ABNORMAL LOW (ref 150–400)
Platelets: 46 10*3/uL — ABNORMAL LOW (ref 150–400)
RBC: 2.13 MIL/uL — ABNORMAL LOW (ref 4.22–5.81)
RBC: 2.39 MIL/uL — ABNORMAL LOW (ref 4.22–5.81)
RBC: 2.01 MIL/uL — ABNORMAL LOW (ref 4.22–5.81)
RDW: 17.9 % — ABNORMAL HIGH (ref 11.5–15.5)
RDW: 18.3 % — ABNORMAL HIGH (ref 11.5–15.5)
RDW: 22.7 % — ABNORMAL HIGH (ref 11.5–15.5)
Smear Review: NORMAL
Smear Review: NORMAL
WBC: 6.8 10*3/uL (ref 4.0–10.5)
WBC: 7 10*3/uL (ref 4.0–10.5)
WBC: 5.4 10*3/uL (ref 4.0–10.5)
nRBC: 0 % (ref 0.0–0.2)
nRBC: 0 % (ref 0.0–0.2)
nRBC: 0 % (ref 0.0–0.2)

## 2024-07-18 LAB — LACTIC ACID, PLASMA: Lactic Acid, Venous: 1.7 mmol/L (ref 0.5–1.9)

## 2024-07-18 LAB — BASIC METABOLIC PANEL WITH GFR
Anion gap: 9 (ref 5–15)
BUN: 39 mg/dL — ABNORMAL HIGH (ref 8–23)
CO2: 23 mmol/L (ref 22–32)
Calcium: 8.4 mg/dL — ABNORMAL LOW (ref 8.9–10.3)
Chloride: 107 mmol/L (ref 98–111)
Creatinine, Ser: 1.59 mg/dL — ABNORMAL HIGH (ref 0.61–1.24)
GFR, Estimated: 44 mL/min — ABNORMAL LOW
Glucose, Bld: 103 mg/dL — ABNORMAL HIGH (ref 70–99)
Potassium: 4.5 mmol/L (ref 3.5–5.1)
Sodium: 139 mmol/L (ref 135–145)

## 2024-07-18 LAB — APTT: aPTT: 39 s — ABNORMAL HIGH (ref 24–36)

## 2024-07-18 LAB — PREPARE RBC (CROSSMATCH)

## 2024-07-18 LAB — FIBRINOGEN: Fibrinogen: 156 mg/dL — ABNORMAL LOW (ref 210–475)

## 2024-07-18 LAB — PROTIME-INR
INR: 1.3 — ABNORMAL HIGH (ref 0.8–1.2)
Prothrombin Time: 16.8 s — ABNORMAL HIGH (ref 11.4–15.2)

## 2024-07-18 MED ORDER — SODIUM CHLORIDE 0.9% IV SOLUTION: Freq: Once | INTRAVENOUS | Status: AC

## 2024-07-18 MED ORDER — LACTATED RINGERS IV BOLUS
1000.0000 mL | Freq: Once | INTRAVENOUS | Status: AC
  Administered 2024-07-18: 1000 mL via INTRAVENOUS

## 2024-07-18 MED ORDER — SODIUM CHLORIDE 0.9 % IV BOLUS
500.0000 mL | Freq: Once | INTRAVENOUS | Status: AC
  Administered 2024-07-18: 500 mL via INTRAVENOUS

## 2024-07-18 NOTE — Progress Notes (Signed)
 [facility] capsule endoscopy ordered by MD Mansouraty.  Patient ingested capsule at 0820.  Per Given's capsule instructions, patient to remain NPO until 1020 at which time they may progress to clear liquid diet. At 1220 patient may have a small snack such as a half a sandwich or a bowl of soup. At 1720 patient may progress to previously ordered diet.  The capsule endoscopy study will conclude at 2020 at which time the recorder and leads or belt can be removed and placed in a patient belongings bag. Endoscopy staff will pick up the equipment in the AM.  Instructions provided to patient and inpatient RN. Patient and RN demonstrated understanding.

## 2024-07-18 NOTE — Progress Notes (Addendum)
 Patient with ongoing acute blood loss anemia.  Currently undergoing VCE.  nursing notified me of softer blood pressures with MAP < 65. Gave 500 mL normal saline without significant improvement. Patient currently has only one IV line in use for fluids.  Evaluated at ~11:35 AM. Repeat BP (right arm cuff) improved to 111/62 (MAP 70).  Exam:  Alert, mentating appropriately Extremities warm Radial and DP pulses palpable bilaterally  Plan: - Place second IV access - Get stat lactic acid - Give additional 500 mL normal saline bolus - Transfuse 1 unit PRBC stat, follow up H&H - Continue close hemodynamic monitoring

## 2024-07-18 NOTE — Progress Notes (Signed)
 "  HD#5 SUBJECTIVE:  Patient Summary: [name] is a 79 y.o.  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   Overnight Events: Hb dropped to 6.6, s/p 1 unit PRBCs.  Interim History: Posttransfusion H&H 7.3 this morning.  Still ongoing melenic stools. Scheduled for VCE today.  OBJECTIVE:  Vital Signs: Vitals:   07/18/24 0056 07/18/24 0126 07/18/24 0414 07/18/24 0752  BP: (!) 114/53 111/62 117/82   Pulse: 69 79 (!) 46   Resp: 18 18 18    Temp: 98 F (36.7 C) 98.2 F (36.8 C)    TempSrc: Axillary Axillary    SpO2: 96% 100% 97% 97%  Weight:      Height:       Supplemental O2: Nasal Cannula SpO2: 97 % O2 Flow Rate (L/min): 3 L/min FiO2 (%): 32 %  Filed Weights   07/11/24 0522 07/12/24 1624  Weight: 60 kg 61.3 kg     Intake/Output Summary (Last 24 hours) at 07/18/2024 9095 Last data filed at 07/18/2024 0400 Gross per 24 hour  Intake 863.33 ml  Output 600 ml  Net 263.33 ml   Net IO Since Admission: -1,676.97 mL [07/18/24 0904]  Physical Exam:  General/Constitutional: chronically ill-appearing, sleepy, and cachectic elderly male lying in bed in no acute distress Cardiovascular: irregularly irregular rhythm with normal rate, no murmurs Respiratory: normal work of breathing on home 3L [place], clear to auscultation in bilateral anterior lung fields Abdomen: soft, supple, non-tender, and non-distended, intact bowel sounds Neurological: alert and oriented x3 MSK: no edema noted to BLE Skin: warm and dry Psych: normal mood and affect  Patient Lines/Drains/Airways Status     Active Line/Drains/Airways     Name Placement date Placement time Site Days   Peripheral IV 07/18/24 20 G  Posterior;Right Forearm 07/18/24  0135  Forearm  less than 1   Suprapubic Catheter Latex 18 Fr. 07/08/22  1058  Latex  741            Pertinent labs and imaging:      Latest Ref Rng & Units 07/18/2024    6:58 AM 07/17/2024   10:34 PM 07/17/2024    7:47 PM  CBC  WBC 4.0 - 10.5 K/uL 7.0  6.8  7.6   Hemoglobin 13.0 - 17.0 g/dL 7.3  6.6  8.2   Hematocrit 39.0 - 52.0 % 23.1  20.9  25.9   Platelets 150 - 400 K/uL 62  59  70        Latest Ref Rng & Units 07/18/2024    6:58 AM 07/17/2024    9:47 AM 07/16/2024   10:16 AM  CMP  Glucose 70 - 99 mg/dL 896  74  861   BUN 8 - 23 mg/dL 39  35  43   Creatinine 0.61 - 1.24 mg/dL 8.40  8.58  8.34   Sodium 135 - 145 mmol/L 139  140  141   Potassium 3.5 - 5.1 mmol/L 4.5  4.6  4.2   Chloride 98 - 111 mmol/L 107  108  109   CO2 22 - 32 mmol/L 23  22  23    Calcium  8.9 - 10.3 mg/dL 8.4  8.7  8.3     No  results found.  ASSESSMENT/PLAN:  Assessment: Principal Problem:   Acute hypoxic respiratory failure (HCC) Active Problems:   Chest pain   Shortness of breath   Erosive gastritis   Acute blood loss anemia   Antiplatelet or antithrombotic long-term use   Transfusion-dependent anemia   Polyposis of colon   Acute on chronic anemia   Hx of adenomatous colonic polyps  Plan: #Acute Blood Loss Anemia, 2/2 Erosive Gastritis #Ampullary Adenoma #Thrombocytopenia Still ongoing bleeding, posttransfusion H&H 7.3 after 1 unit of PRBCs.  Mild BUN elevation reflects dehydration from bowel prep, and occult bleeding.  Not endorsing shortness of breath or chest pain.   -Colonoscopy completed yesterday 1/31, no bleeding noted, 2-7 polyp that were resected, nonbleeding internal/external hemorrhoids.  Large amount of semiliquid stool was found in the entire colon, interfering with visualization.   - VCE ongoing this morning, anticipate the results tomorrow. - H&H Q8hrs, transfuse Hb<7. -Continue Pepcid , Carafate , Protonix . - Holding Lexapro  and mirtazapine   due to ABLA. - SCDs for DVT prophylaxis  #History of Hypertension #Hypotension Normotensive.  - Hold home metoprolol  d/t to bradycardia and hypotension - Hold home amlodipine  and spironolactone  d/t hypotension - Hold home Entresto  d/t hypotension, may restart at half dose if pressures tolerate  #Acute on chronic hypoxic respiratory failure #COPD on 3L [place] at home #HFmrEF Patient at baseline oxygen-dependence (3L [place]) without new dyspnea or chest pain. On exam, normal work of breathing with adequate oxygen saturation in the upper 90s. Clear breath sounds auscultated in the anterior lung fields bilaterally but no wheezes. Appears euvolemic on exam without any lower extremity swelling. His difficulty breathing and hypoxia on presentation is likely related to his underlying anemia. Will continue to provide breathing treatments and hold home HF medications. Will consider restarting HF medications if his pressures and heart rates can tolerate. - Hold Entresto  and Spironolactone  d/t hypotension - Hold Toprol -XL d/t bradycardia - Brovana  Nebulizer 15 mcg bid + Incruse Ellipta    #Chronic Kidney Disease, Stage 3  SCr mildly up trended to 1.60 from 1.40( baseline Cr of 1.5-1.6).  Mild SCr uptrend likely secondary to dehydration from bowel prep. - Trend BMP's - Will encourage p.o. hydration.  #Persistent Afib not on [facility]  Patient is in atrial fibrillation with rate control (metoprolol  succinate 25 mg daily). His CHA2DS2-VASc score is 5. Currently not on any anticoagulation due to history of prior GI bleeding on Xarelto . Rates have been controlled without medication. Currently asymptomatic. If his rates increase, will plan to restart metoprolol  12.5 mg daily. - Continuous cardiac monitoring - Hold home metoprolol  d/t to hypotension and bradycardia  #Possible Aortic EndoLeak seen on CTA #Aortic Aneurysm s/p repair Given patient's dyspnea, chest pain, and acute blood loss anemia, vascular surgery  previously consulted for possible aortic endoleak seen on CTA. Dr. Pearline reviewed the case and has no concern for dissection or worsening AAA. He recommends follow up outpatient (scheduled for 07/26/24). Patient denies any new chest pain or dyspnea since admission. Will continue to monitor patient's status. - Hold home ASA and antiplatelet therapy d/t recent Hgb drop - Continue home rosuvastatin   #Prior NSTEMI #Aortic atherosclerosis Patient denies any new chest pain and dyspnea overnight. Low concern for ACS but will continue to monitor given patient's risk factors.  - Continuous cardiac monitoring - For new chest pain, stat EKG and troponins - Hold home ASA and antiplatelet therapy d/t ABLA - Continue home rosuvastatin   #Concern for Malnutrition #Cachexia Patient remains chronically ill-appearing with cachexia (BMI 18.33). Cachexia likely  in the setting of known prostate adenocarcinoma + emphysema/COPD. His Ampulla of Vater adenoma may also be contributing if there has been malignant transformation. Further workup of gastritis and ampullary adenoma in outpatient setting (discussed above) - Ensure supplements bid b/w meals - Hold mirtazapine  15 mg nightly d/t ABLA  Chronic Stable Medical Conditions   #Chronic Hip Pain - continue home Norco #TUD - continue nicotine  patches #GERD - see plan above for ABLA #HLD - continue home rosuvastatin  #Depression - continue home escitalopram   Best Practice: Diet: Heart Healthy IVF: Fluids: None, Rate: None VTE: Place and maintain sequential compression device Start: 07/12/24 1131 Code: Full  Disposition planning: Therapy Recs: None, DME: none Family Contact: sister, to be notified. DISPO: Anticipated discharge in 1-2 days to Home pending stabilization of bleeding.  Signature: Candid Bovey, M.D.  Internal Medicine Resident, PGY-2  9:04 AM, 07/18/2024  "

## 2024-07-19 LAB — CBC WITH DIFFERENTIAL/PLATELET
Abs Immature Granulocytes: 0.04 10*3/uL (ref 0.00–0.07)
Abs Immature Granulocytes: 0.05 10*3/uL (ref 0.00–0.07)
Basophils Absolute: 0.1 10*3/uL (ref 0.0–0.1)
Basophils Absolute: 0 10*3/uL (ref 0.0–0.1)
Basophils Absolute: 0 10*3/uL (ref 0.0–0.1)
Basophils Relative: 2 %
Basophils Relative: 1 %
Basophils Relative: 1 %
Eosinophils Absolute: 0.1 10*3/uL (ref 0.0–0.5)
Eosinophils Absolute: 0.1 10*3/uL (ref 0.0–0.5)
Eosinophils Absolute: 0.1 10*3/uL (ref 0.0–0.5)
Eosinophils Relative: 1 %
Eosinophils Relative: 1 %
Eosinophils Relative: 1 %
HCT: 18.6 % — ABNORMAL LOW (ref 39.0–52.0)
HCT: 26 % — ABNORMAL LOW (ref 39.0–52.0)
HCT: 26.8 % — ABNORMAL LOW (ref 39.0–52.0)
Hemoglobin: 6.1 g/dL — CL (ref 13.0–17.0)
Hemoglobin: 8.8 g/dL — ABNORMAL LOW (ref 13.0–17.0)
Hemoglobin: 9.1 g/dL — ABNORMAL LOW (ref 13.0–17.0)
Immature Granulocytes: 1 %
Immature Granulocytes: 1 %
Lymphocytes Relative: 8 %
Lymphocytes Relative: 19 %
Lymphocytes Relative: 19 %
Lymphs Abs: 0.5 10*3/uL — ABNORMAL LOW (ref 0.7–4.0)
Lymphs Abs: 1.2 10*3/uL (ref 0.7–4.0)
Lymphs Abs: 1.2 10*3/uL (ref 0.7–4.0)
MCH: 28.9 pg (ref 26.0–34.0)
MCH: 29.7 pg (ref 26.0–34.0)
MCH: 29.9 pg (ref 26.0–34.0)
MCHC: 32.8 g/dL (ref 30.0–36.0)
MCHC: 33.8 g/dL (ref 30.0–36.0)
MCHC: 34 g/dL (ref 30.0–36.0)
MCV: 88.2 fL (ref 80.0–100.0)
MCV: 87.8 fL (ref 80.0–100.0)
MCV: 88.2 fL (ref 80.0–100.0)
Monocytes Absolute: 0.3 10*3/uL (ref 0.1–1.0)
Monocytes Absolute: 0.7 10*3/uL (ref 0.1–1.0)
Monocytes Absolute: 0.7 10*3/uL (ref 0.1–1.0)
Monocytes Relative: 4 %
Monocytes Relative: 11 %
Monocytes Relative: 11 %
Neutro Abs: 5.6 10*3/uL (ref 1.7–7.7)
Neutro Abs: 4.2 10*3/uL (ref 1.7–7.7)
Neutro Abs: 4.3 10*3/uL (ref 1.7–7.7)
Neutrophils Relative %: 85 %
Neutrophils Relative %: 67 %
Neutrophils Relative %: 67 %
Platelets: 54 10*3/uL — ABNORMAL LOW (ref 150–400)
Platelets: 47 10*3/uL — ABNORMAL LOW (ref 150–400)
Platelets: 47 10*3/uL — ABNORMAL LOW (ref 150–400)
RBC: 2.11 MIL/uL — ABNORMAL LOW (ref 4.22–5.81)
RBC: 2.96 MIL/uL — ABNORMAL LOW (ref 4.22–5.81)
RBC: 3.04 MIL/uL — ABNORMAL LOW (ref 4.22–5.81)
RDW: 21.4 % — ABNORMAL HIGH (ref 11.5–15.5)
RDW: 18.3 % — ABNORMAL HIGH (ref 11.5–15.5)
RDW: 18.5 % — ABNORMAL HIGH (ref 11.5–15.5)
WBC: 6.6 10*3/uL (ref 4.0–10.5)
WBC: 6.1 10*3/uL (ref 4.0–10.5)
WBC: 6.3 10*3/uL (ref 4.0–10.5)
nRBC: 0 % (ref 0.0–0.2)
nRBC: 0 % (ref 0.0–0.2)
nRBC: 0 % (ref 0.0–0.2)

## 2024-07-19 LAB — BASIC METABOLIC PANEL WITH GFR
Anion gap: 10 (ref 5–15)
BUN: 41 mg/dL — ABNORMAL HIGH (ref 8–23)
CO2: 21 mmol/L — ABNORMAL LOW (ref 22–32)
Calcium: 7.9 mg/dL — ABNORMAL LOW (ref 8.9–10.3)
Chloride: 109 mmol/L (ref 98–111)
Creatinine, Ser: 1.58 mg/dL — ABNORMAL HIGH (ref 0.61–1.24)
GFR, Estimated: 44 mL/min — ABNORMAL LOW
Glucose, Bld: 80 mg/dL (ref 70–99)
Potassium: 5 mmol/L (ref 3.5–5.1)
Sodium: 139 mmol/L (ref 135–145)

## 2024-07-19 LAB — PREPARE RBC (CROSSMATCH)

## 2024-07-19 MED ORDER — SODIUM CHLORIDE 0.9% IV SOLUTION: Freq: Once | INTRAVENOUS | Status: AC

## 2024-07-19 MED ORDER — PANTOPRAZOLE SODIUM 40 MG IV SOLR
40.0000 mg | Freq: Two times a day (BID) | INTRAVENOUS | Status: AC
  Administered 2024-07-19 – 2024-07-23 (×8): 40 mg via INTRAVENOUS
  Filled 2024-07-19 (×9): qty 10

## 2024-07-19 MED ORDER — SODIUM CHLORIDE 0.9 % IV SOLN: INTRAVENOUS | Status: DC

## 2024-07-19 NOTE — Progress Notes (Signed)
 PT Cancellation Note  Patient Details Name: [name] MRN: [id number] DOB: [birth year]   Cancelled Treatment:    Reason Eval/Treat Not Completed: Patient at procedure or test/unavailable;Patient declined, no reason specifiedPt resting in bed, reports no pain but does state he is pretty tired. Currently undergoing PRBC infusion due to low Hgb. Pt eager for mobility but prefers to wait until after infusion completed; PT agreeable to return in PM once infusion completed and pt has time to rest.   Isaiah DEL. Alee Gressman, PT, DPT  [employer] 07/19/2024, 11:25 AM

## 2024-07-19 NOTE — Progress Notes (Signed)
"      ° °  See capsule endoscopy report.  Active bleeding seen.  Explained to patient - images of caspule report in room.  EGD tomorrow.  Lupita CHARLENA Commander, MD, [facility] See TRACEY on call - gastroenterology for best contact person 07/19/2024 11:35 AM  "

## 2024-07-19 NOTE — Progress Notes (Signed)
" ° ° °  PROCEDURAL EXPEDITER PROGRESS NOTE  Patient Name: [name]  DOB:Feb 23, 1946 Date of Admission: 07/11/2024  Date of Assessment:07/19/24   -------------------------------------------------------------------------------------------------------------------   Brief clinical summary: pt is a 79 yr old make going for a EGD on 07/20/24 for active bleeding.    Orders in place:  Yes   Communication with surgical team if no orders: n/a  Labs, test, and orders reviewed: yes  Requires surgical clearance:  No  What type of clearance: n/a  Clearance received: n/a  Barriers noted:n/a   Intervention provided by [facility] team: n/a  Barrier resolved: n/a not applicable   -------------------------------------------------------------------------------------------------------------------  [facility] Patient [facility] Expediter, [name] Please contact us  directly via secure chat (search for [facility]) or by calling us  at [phone] [facility]).  "

## 2024-07-20 ENCOUNTER — Encounter (HOSPITAL_COMMUNITY): Payer: Self-pay | Admitting: Internal Medicine

## 2024-07-20 ENCOUNTER — Encounter (HOSPITAL_COMMUNITY): Admission: EM | Payer: Self-pay | Source: Home / Self Care | Attending: Internal Medicine

## 2024-07-20 ENCOUNTER — Inpatient Hospital Stay (HOSPITAL_COMMUNITY): Admitting: Anesthesiology

## 2024-07-20 DIAGNOSIS — K254 Chronic or unspecified gastric ulcer with hemorrhage: Secondary | ICD-10-CM

## 2024-07-20 DIAGNOSIS — K3189 Other diseases of stomach and duodenum: Secondary | ICD-10-CM | POA: Diagnosis not present

## 2024-07-20 DIAGNOSIS — K449 Diaphragmatic hernia without obstruction or gangrene: Secondary | ICD-10-CM | POA: Diagnosis not present

## 2024-07-20 DIAGNOSIS — K317 Polyp of stomach and duodenum: Secondary | ICD-10-CM | POA: Diagnosis not present

## 2024-07-20 DIAGNOSIS — K250 Acute gastric ulcer with hemorrhage: Secondary | ICD-10-CM

## 2024-07-20 DIAGNOSIS — K921 Melena: Secondary | ICD-10-CM

## 2024-07-20 LAB — PREPARE CRYOPRECIPITATE
Unit division: 0
Unit division: 0
Unit division: 0

## 2024-07-20 LAB — TYPE AND SCREEN
ABO/RH(D): O POS
ABO/RH(D): O POS
Antibody Screen: NEGATIVE
Antibody Screen: NEGATIVE
Unit division: 0
Unit division: 0
Unit division: 0
Unit division: 0
Unit division: 0
Unit division: 0
Unit division: 0
Unit division: 0
Unit division: 0
Unit division: 0
Unit division: 0
Unit division: 0
Unit division: 0
Unit division: 0
Unit division: 0

## 2024-07-20 LAB — PREPARE PLATELET PHERESIS: Unit division: 0

## 2024-07-20 LAB — CBC WITH DIFFERENTIAL/PLATELET
Abs Immature Granulocytes: 0.06 10*3/uL (ref 0.00–0.07)
Abs Immature Granulocytes: 0.03 10*3/uL (ref 0.00–0.07)
Basophils Absolute: 0 10*3/uL (ref 0.0–0.1)
Basophils Absolute: 0 10*3/uL (ref 0.0–0.1)
Basophils Relative: 0 %
Basophils Relative: 0 %
Eosinophils Absolute: 0.1 10*3/uL (ref 0.0–0.5)
Eosinophils Absolute: 0.1 10*3/uL (ref 0.0–0.5)
Eosinophils Relative: 2 %
Eosinophils Relative: 2 %
HCT: 23.8 % — ABNORMAL LOW (ref 39.0–52.0)
HCT: 20.4 % — ABNORMAL LOW (ref 39.0–52.0)
Hemoglobin: 7.9 g/dL — ABNORMAL LOW (ref 13.0–17.0)
Hemoglobin: 6.7 g/dL — CL (ref 13.0–17.0)
Immature Granulocytes: 1 %
Immature Granulocytes: 0 %
Lymphocytes Relative: 20 %
Lymphocytes Relative: 22 %
Lymphs Abs: 1.3 10*3/uL (ref 0.7–4.0)
Lymphs Abs: 1.6 10*3/uL (ref 0.7–4.0)
MCH: 29.5 pg (ref 26.0–34.0)
MCH: 29.6 pg (ref 26.0–34.0)
MCHC: 33.2 g/dL (ref 30.0–36.0)
MCHC: 32.8 g/dL (ref 30.0–36.0)
MCV: 88.8 fL (ref 80.0–100.0)
MCV: 90.3 fL (ref 80.0–100.0)
Monocytes Absolute: 0.6 10*3/uL (ref 0.1–1.0)
Monocytes Absolute: 0.7 10*3/uL (ref 0.1–1.0)
Monocytes Relative: 10 %
Monocytes Relative: 10 %
Neutro Abs: 4.2 10*3/uL (ref 1.7–7.7)
Neutro Abs: 4.9 10*3/uL (ref 1.7–7.7)
Neutrophils Relative %: 67 %
Neutrophils Relative %: 66 %
Platelets: 50 10*3/uL — ABNORMAL LOW (ref 150–400)
Platelets: 47 10*3/uL — ABNORMAL LOW (ref 150–400)
RBC: 2.68 MIL/uL — ABNORMAL LOW (ref 4.22–5.81)
RBC: 2.26 MIL/uL — ABNORMAL LOW (ref 4.22–5.81)
RDW: 18.9 % — ABNORMAL HIGH (ref 11.5–15.5)
RDW: 18.9 % — ABNORMAL HIGH (ref 11.5–15.5)
WBC: 6.3 10*3/uL (ref 4.0–10.5)
WBC: 7.5 10*3/uL (ref 4.0–10.5)
nRBC: 0 % (ref 0.0–0.2)
nRBC: 0 % (ref 0.0–0.2)

## 2024-07-20 LAB — BPAM RBC
Blood Product Expiration Date: 202602232359
Blood Product Expiration Date: 202602252359
Blood Product Expiration Date: 202602152359
Blood Product Expiration Date: 202602242359
Blood Product Expiration Date: 202602242359
Blood Product Expiration Date: 202602252359
Blood Product Expiration Date: 202602262359
Blood Product Expiration Date: 202603032359
Blood Product Expiration Date: 202603032359
Blood Product Expiration Date: 202603062359
Blood Product Unit Number: 202602152359
Blood Product Unit Number: 202603032359
ISSUE DATE / TIME: 202601270429
ISSUE DATE / TIME: 202601300501
ISSUE DATE / TIME: 202601300501
ISSUE DATE / TIME: 202602010052
ISSUE DATE / TIME: 202602011134
ISSUE DATE / TIME: 202602012022
ISSUE DATE / TIME: 202602012244
ISSUE DATE / TIME: 202602020616
ISSUE DATE / TIME: 202602021438
PRODUCT CODE: 202602021438
PRODUCT CODE: 202602252359
PRODUCT CODE: 202602020955
PRODUCT CODE: 202603032359
Unit Type and Rh: 202602152359
Unit Type and Rh: 5100
Unit Type and Rh: 5100
Unit Type and Rh: 9500
Unit Type and Rh: 9500
Unit Type and Rh: 202602242359
Unit Type and Rh: 202603032359
Unit Type and Rh: 5100
Unit Type and Rh: 5100
Unit Type and Rh: 5100
Unit Type and Rh: 5100
Unit Type and Rh: 9500
Unit Type and Rh: 9500
Unit Type and Rh: 9500
Unit Type and Rh: 9500
Unit Type and Rh: 9500
Unit Type and Rh: 9500

## 2024-07-20 LAB — BPAM CRYOPRECIPITATE
Blood Product Expiration Date: 202602052359
Blood Product Expiration Date: 202602072359
Blood Product Expiration Date: 202602072359
ISSUE DATE / TIME: 202602021202
ISSUE DATE / TIME: 202602022341
ISSUE DATE / TIME: 202602022341
Unit Type and Rh: 5100
Unit Type and Rh: 5100
Unit Type and Rh: 6200

## 2024-07-20 LAB — BPAM PLATELET PHERESIS
Blood Product Expiration Date: 202602022359
ISSUE DATE / TIME: 202602020152
Unit Type and Rh: 202602022359
Unit Type and Rh: 5100

## 2024-07-20 LAB — BASIC METABOLIC PANEL WITH GFR
Anion gap: 6 (ref 5–15)
BUN: 34 mg/dL — ABNORMAL HIGH (ref 8–23)
CO2: 24 mmol/L (ref 22–32)
Calcium: 8.2 mg/dL — ABNORMAL LOW (ref 8.9–10.3)
Chloride: 109 mmol/L (ref 98–111)
Creatinine, Ser: 1.39 mg/dL — ABNORMAL HIGH (ref 0.61–1.24)
GFR, Estimated: 52 mL/min — ABNORMAL LOW
Glucose, Bld: 94 mg/dL (ref 70–99)
Potassium: 4.9 mmol/L (ref 3.5–5.1)
Sodium: 138 mmol/L (ref 135–145)

## 2024-07-20 LAB — PREPARE RBC (CROSSMATCH)

## 2024-07-20 LAB — SURGICAL PATHOLOGY

## 2024-07-20 MED ORDER — LIDOCAINE 2% (20 MG/ML) 5 ML SYRINGE
INTRAMUSCULAR | Status: DC | PRN
  Administered 2024-07-20: 100 mg via INTRAVENOUS

## 2024-07-20 MED ORDER — ONDANSETRON HCL 4 MG/2ML IJ SOLN
INTRAMUSCULAR | Status: DC | PRN
  Administered 2024-07-20: 4 mg via INTRAVENOUS

## 2024-07-20 MED ORDER — FENTANYL CITRATE (PF) 100 MCG/2ML IJ SOLN
INTRAMUSCULAR | Status: DC | PRN
  Administered 2024-07-20 (×2): 50 ug via INTRAVENOUS

## 2024-07-20 MED ORDER — ALBUMIN HUMAN 5 % IV SOLN: INTRAVENOUS | Status: DC | PRN

## 2024-07-20 MED ORDER — SODIUM CHLORIDE 0.9% IV SOLUTION: Freq: Once | INTRAVENOUS | Status: AC

## 2024-07-20 MED ORDER — DEXMEDETOMIDINE HCL IN NACL 80 MCG/20ML IV SOLN
INTRAVENOUS | Status: DC | PRN
  Administered 2024-07-20: 10 ug via INTRAVENOUS

## 2024-07-20 MED ORDER — PHENYLEPHRINE 80 MCG/ML (10ML) SYRINGE FOR IV PUSH (FOR BLOOD PRESSURE SUPPORT)
PREFILLED_SYRINGE | INTRAVENOUS | Status: DC | PRN
  Administered 2024-07-20: 240 ug via INTRAVENOUS
  Administered 2024-07-20: 160 ug via INTRAVENOUS

## 2024-07-20 MED ORDER — PROPOFOL 500 MG/50ML IV EMUL
INTRAVENOUS | Status: DC | PRN
  Administered 2024-07-20: 100 ug/kg/min via INTRAVENOUS

## 2024-07-20 MED ORDER — PHENYLEPHRINE HCL-NACL 20-0.9 MG/250ML-% IV SOLN
INTRAVENOUS | Status: DC | PRN
  Administered 2024-07-20: 30 ug/min via INTRAVENOUS

## 2024-07-21 ENCOUNTER — Inpatient Hospital Stay (HOSPITAL_COMMUNITY)

## 2024-07-21 DIAGNOSIS — D696 Thrombocytopenia, unspecified: Secondary | ICD-10-CM | POA: Diagnosis not present

## 2024-07-21 DIAGNOSIS — K921 Melena: Secondary | ICD-10-CM | POA: Diagnosis not present

## 2024-07-21 DIAGNOSIS — Z7902 Long term (current) use of antithrombotics/antiplatelets: Secondary | ICD-10-CM

## 2024-07-21 DIAGNOSIS — D62 Acute posthemorrhagic anemia: Secondary | ICD-10-CM | POA: Diagnosis not present

## 2024-07-21 DIAGNOSIS — J9611 Chronic respiratory failure with hypoxia: Secondary | ICD-10-CM | POA: Diagnosis not present

## 2024-07-21 DIAGNOSIS — Z720 Tobacco use: Secondary | ICD-10-CM

## 2024-07-21 DIAGNOSIS — E43 Unspecified severe protein-calorie malnutrition: Secondary | ICD-10-CM | POA: Insufficient documentation

## 2024-07-21 DIAGNOSIS — I4819 Other persistent atrial fibrillation: Secondary | ICD-10-CM | POA: Diagnosis not present

## 2024-07-21 DIAGNOSIS — J9601 Acute respiratory failure with hypoxia: Secondary | ICD-10-CM | POA: Diagnosis not present

## 2024-07-21 LAB — PREPARE RBC (CROSSMATCH)

## 2024-07-21 LAB — CBC WITH DIFFERENTIAL/PLATELET
Abs Immature Granulocytes: 0.04 10*3/uL (ref 0.00–0.07)
Abs Immature Granulocytes: 0.04 10*3/uL (ref 0.00–0.07)
Abs Immature Granulocytes: 0.06 10*3/uL (ref 0.00–0.07)
Abs Immature Granulocytes: 0.05 10*3/uL (ref 0.00–0.07)
Basophils Absolute: 0 10*3/uL (ref 0.0–0.1)
Basophils Absolute: 0 10*3/uL (ref 0.0–0.1)
Basophils Absolute: 0 10*3/uL (ref 0.0–0.1)
Basophils Absolute: 0.1 10*3/uL (ref 0.0–0.1)
Basophils Relative: 0 %
Basophils Relative: 0 %
Basophils Relative: 0 %
Basophils Relative: 0 %
Eosinophils Absolute: 0.1 10*3/uL (ref 0.0–0.5)
Eosinophils Absolute: 0.1 10*3/uL (ref 0.0–0.5)
Eosinophils Absolute: 0.1 10*3/uL (ref 0.0–0.5)
Eosinophils Absolute: 0 10*3/uL (ref 0.0–0.5)
Eosinophils Relative: 2 %
Eosinophils Relative: 1 %
Eosinophils Relative: 1 %
Eosinophils Relative: 0 %
HCT: 23.6 % — ABNORMAL LOW (ref 39.0–52.0)
HCT: 23.3 % — ABNORMAL LOW (ref 39.0–52.0)
HCT: 26.9 % — ABNORMAL LOW (ref 39.0–52.0)
HCT: 24.8 % — ABNORMAL LOW (ref 39.0–52.0)
Hemoglobin: 8 g/dL — ABNORMAL LOW (ref 13.0–17.0)
Hemoglobin: 7.6 g/dL — ABNORMAL LOW (ref 13.0–17.0)
Hemoglobin: 8.9 g/dL — ABNORMAL LOW (ref 13.0–17.0)
Hemoglobin: 8.3 g/dL — ABNORMAL LOW (ref 13.0–17.0)
Immature Granulocytes: 1 %
Immature Granulocytes: 0 %
Immature Granulocytes: 1 %
Immature Granulocytes: 0 %
Lymphocytes Relative: 15 %
Lymphocytes Relative: 10 %
Lymphocytes Relative: 6 %
Lymphocytes Relative: 7 %
Lymphs Abs: 1.4 10*3/uL (ref 0.7–4.0)
Lymphs Abs: 1 10*3/uL (ref 0.7–4.0)
Lymphs Abs: 0.6 10*3/uL — ABNORMAL LOW (ref 0.7–4.0)
Lymphs Abs: 0.8 10*3/uL (ref 0.7–4.0)
MCH: 29.2 pg (ref 26.0–34.0)
MCH: 28.1 pg (ref 26.0–34.0)
MCH: 28.8 pg (ref 26.0–34.0)
MCH: 28.7 pg (ref 26.0–34.0)
MCHC: 33.9 g/dL (ref 30.0–36.0)
MCHC: 32.6 g/dL (ref 30.0–36.0)
MCHC: 33.1 g/dL (ref 30.0–36.0)
MCHC: 33.5 g/dL (ref 30.0–36.0)
MCV: 86.1 fL (ref 80.0–100.0)
MCV: 86.3 fL (ref 80.0–100.0)
MCV: 87.1 fL (ref 80.0–100.0)
MCV: 85.8 fL (ref 80.0–100.0)
Monocytes Absolute: 0.9 10*3/uL (ref 0.1–1.0)
Monocytes Absolute: 0.7 10*3/uL (ref 0.1–1.0)
Monocytes Absolute: 0.6 10*3/uL (ref 0.1–1.0)
Monocytes Absolute: 1.1 10*3/uL — ABNORMAL HIGH (ref 0.1–1.0)
Monocytes Relative: 10 %
Monocytes Relative: 7 %
Monocytes Relative: 6 %
Monocytes Relative: 9 %
Neutro Abs: 6.4 10*3/uL (ref 1.7–7.7)
Neutro Abs: 7.8 10*3/uL — ABNORMAL HIGH (ref 1.7–7.7)
Neutro Abs: 9.3 10*3/uL — ABNORMAL HIGH (ref 1.7–7.7)
Neutro Abs: 9.6 10*3/uL — ABNORMAL HIGH (ref 1.7–7.7)
Neutrophils Relative %: 72 %
Neutrophils Relative %: 82 %
Neutrophils Relative %: 86 %
Neutrophils Relative %: 84 %
Platelets: 45 10*3/uL — ABNORMAL LOW (ref 150–400)
Platelets: 67 10*3/uL — ABNORMAL LOW (ref 150–400)
Platelets: 125 10*3/uL — ABNORMAL LOW (ref 150–400)
Platelets: 105 10*3/uL — ABNORMAL LOW (ref 150–400)
RBC: 2.74 MIL/uL — ABNORMAL LOW (ref 4.22–5.81)
RBC: 2.7 MIL/uL — ABNORMAL LOW (ref 4.22–5.81)
RBC: 3.09 MIL/uL — ABNORMAL LOW (ref 4.22–5.81)
RBC: 2.89 MIL/uL — ABNORMAL LOW (ref 4.22–5.81)
RDW: 21.7 % — ABNORMAL HIGH (ref 11.5–15.5)
RDW: 22.1 % — ABNORMAL HIGH (ref 11.5–15.5)
RDW: 20.3 % — ABNORMAL HIGH (ref 11.5–15.5)
RDW: 20 % — ABNORMAL HIGH (ref 11.5–15.5)
Smear Review: NORMAL
Smear Review: NORMAL
WBC: 8.8 10*3/uL (ref 4.0–10.5)
WBC: 9.7 10*3/uL (ref 4.0–10.5)
WBC: 10.6 10*3/uL — ABNORMAL HIGH (ref 4.0–10.5)
WBC: 11.7 10*3/uL — ABNORMAL HIGH (ref 4.0–10.5)
nRBC: 0 % (ref 0.0–0.2)
nRBC: 0 % (ref 0.0–0.2)
nRBC: 0 % (ref 0.0–0.2)
nRBC: 0 % (ref 0.0–0.2)

## 2024-07-21 LAB — BASIC METABOLIC PANEL WITH GFR
Anion gap: 6 (ref 5–15)
BUN: 33 mg/dL — ABNORMAL HIGH (ref 8–23)
CO2: 25 mmol/L (ref 22–32)
Calcium: 8 mg/dL — ABNORMAL LOW (ref 8.9–10.3)
Chloride: 106 mmol/L (ref 98–111)
Creatinine, Ser: 1.38 mg/dL — ABNORMAL HIGH (ref 0.61–1.24)
GFR, Estimated: 52 mL/min — ABNORMAL LOW
Glucose, Bld: 130 mg/dL — ABNORMAL HIGH (ref 70–99)
Potassium: 4.7 mmol/L (ref 3.5–5.1)
Sodium: 138 mmol/L (ref 135–145)

## 2024-07-21 LAB — COMPREHENSIVE METABOLIC PANEL WITH GFR
ALT: 81 U/L — ABNORMAL HIGH (ref 0–44)
AST: 82 U/L — ABNORMAL HIGH (ref 15–41)
Albumin: 3.5 g/dL (ref 3.5–5.0)
Alkaline Phosphatase: 62 U/L (ref 38–126)
Anion gap: 12 (ref 5–15)
BUN: 32 mg/dL — ABNORMAL HIGH (ref 8–23)
CO2: 23 mmol/L (ref 22–32)
Calcium: 8.5 mg/dL — ABNORMAL LOW (ref 8.9–10.3)
Chloride: 103 mmol/L (ref 98–111)
Creatinine, Ser: 1.28 mg/dL — ABNORMAL HIGH (ref 0.61–1.24)
GFR, Estimated: 57 mL/min — ABNORMAL LOW
Glucose, Bld: 110 mg/dL — ABNORMAL HIGH (ref 70–99)
Potassium: 4.4 mmol/L (ref 3.5–5.1)
Sodium: 137 mmol/L (ref 135–145)
Total Bilirubin: 0.9 mg/dL (ref 0.0–1.2)
Total Protein: 5.7 g/dL — ABNORMAL LOW (ref 6.5–8.1)

## 2024-07-21 LAB — LACTIC ACID, PLASMA
Lactic Acid, Venous: 2.2 mmol/L (ref 0.5–1.9)
Lactic Acid, Venous: 1.8 mmol/L (ref 0.5–1.9)

## 2024-07-21 LAB — AMMONIA: Ammonia: 42 umol/L — ABNORMAL HIGH (ref 9–35)

## 2024-07-21 LAB — GLOBAL TEG PANEL
CFF Max Amplitude: 13.4 mm — ABNORMAL LOW (ref 15–32)
CK with Heparinase (R): 5.3 min (ref 4.3–8.3)
Citrated Functional Fibrinogen: 244.5 mg/dL — ABNORMAL LOW (ref 278–581)
Citrated Kaolin (K): 1.8 min (ref 0.8–2.1)
Citrated Kaolin (MA): 53.8 mm (ref 52–69)
Citrated Kaolin (R): 6.3 min (ref 4.6–9.1)
Citrated Kaolin Angle: 68.4 deg (ref 63–78)
Citrated Rapid TEG (MA): 52.9 mm (ref 52–70)

## 2024-07-21 LAB — PROTIME-INR
INR: 1.2 (ref 0.8–1.2)
Prothrombin Time: 16.1 s — ABNORMAL HIGH (ref 11.4–15.2)

## 2024-07-21 LAB — FIBRINOGEN: Fibrinogen: 168 mg/dL — ABNORMAL LOW (ref 210–475)

## 2024-07-21 LAB — RETICULOCYTES
Immature Retic Fract: 30.6 % — ABNORMAL HIGH (ref 2.3–15.9)
RBC.: 3.05 MIL/uL — ABNORMAL LOW (ref 4.22–5.81)
Retic Count, Absolute: 67.4 10*3/uL (ref 19.0–186.0)
Retic Ct Pct: 2.2 % (ref 0.4–3.1)

## 2024-07-21 LAB — GLUCOSE, CAPILLARY: Glucose-Capillary: 98 mg/dL (ref 70–99)

## 2024-07-21 LAB — APTT: aPTT: 40 s — ABNORMAL HIGH (ref 24–36)

## 2024-07-21 LAB — IMMATURE PLATELET FRACTION: Immature Platelet Fraction: 1.7 % (ref 1.2–8.6)

## 2024-07-21 LAB — HEMOGLOBIN A1C
Hgb A1c MFr Bld: 5.4 % (ref 4.8–5.6)
Mean Plasma Glucose: 108.28 mg/dL

## 2024-07-21 LAB — MRSA NEXT GEN BY PCR, NASAL: MRSA by PCR Next Gen: NOT DETECTED

## 2024-07-21 MED ORDER — ORAL CARE MOUTH RINSE
15.0000 mL | OROMUCOSAL | Status: DC
  Administered 2024-07-21 – 2024-07-22 (×8): 15 mL via OROMUCOSAL

## 2024-07-21 MED ORDER — ORAL CARE MOUTH RINSE
15.0000 mL | OROMUCOSAL | Status: DC | PRN
  Administered 2024-07-21: 15 mL via OROMUCOSAL

## 2024-07-21 MED ORDER — MIDAZOLAM HCL (PF) 2 MG/2ML IJ SOLN
2.0000 mg | Freq: Once | INTRAMUSCULAR | Status: AC
  Administered 2024-07-21: 2 mg via INTRAVENOUS

## 2024-07-21 MED ORDER — CLONAZEPAM 0.25 MG PO TBDP
0.5000 mg | ORAL_TABLET | Freq: Two times a day (BID) | ORAL | Status: DC | PRN
  Administered 2024-07-21: 0.5 mg via ORAL
  Filled 2024-07-21: qty 2

## 2024-07-21 MED ORDER — INSULIN ASPART 100 UNIT/ML IJ SOLN: 0.0000 [IU] | INTRAMUSCULAR | Status: AC

## 2024-07-21 MED ORDER — FENTANYL CITRATE (PF) 50 MCG/ML IJ SOSY
100.0000 ug | PREFILLED_SYRINGE | Freq: Once | INTRAMUSCULAR | Status: AC
  Administered 2024-07-21: 100 ug via INTRAVENOUS

## 2024-07-21 MED ORDER — LORAZEPAM 2 MG/ML IJ SOLN
1.0000 mg | Freq: Once | INTRAMUSCULAR | Status: AC
  Administered 2024-07-21: 1 mg via INTRAVENOUS
  Filled 2024-07-21: qty 1

## 2024-07-21 MED ORDER — FENTANYL CITRATE (PF) 50 MCG/ML IJ SOSY
PREFILLED_SYRINGE | INTRAMUSCULAR | Status: AC
  Filled 2024-07-21: qty 1

## 2024-07-21 MED ORDER — FENTANYL CITRATE (PF) 50 MCG/ML IJ SOSY
PREFILLED_SYRINGE | INTRAMUSCULAR | Status: AC
  Filled 2024-07-21: qty 2

## 2024-07-21 MED ORDER — ETOMIDATE 2 MG/ML IV SOLN
INTRAVENOUS | Status: AC
  Filled 2024-07-21: qty 20

## 2024-07-21 MED ORDER — ROCURONIUM BROMIDE 10 MG/ML (PF) SYRINGE
50.0000 mg | PREFILLED_SYRINGE | Freq: Once | INTRAVENOUS | Status: AC
  Administered 2024-07-21: 50 mg via INTRAVENOUS

## 2024-07-21 MED ORDER — FUROSEMIDE 10 MG/ML IJ SOLN
40.0000 mg | Freq: Once | INTRAMUSCULAR | Status: AC
  Administered 2024-07-21: 40 mg via INTRAVENOUS
  Filled 2024-07-21: qty 4

## 2024-07-21 MED ORDER — MIDAZOLAM HCL 2 MG/2ML IJ SOLN
INTRAMUSCULAR | Status: AC
  Filled 2024-07-21: qty 2

## 2024-07-21 MED ORDER — POLYETHYLENE GLYCOL 3350 17 G PO PACK: 17.0000 g | PACK | Freq: Every day | ORAL | Status: DC

## 2024-07-21 MED ORDER — SENNA 8.6 MG PO TABS: 1.0000 | ORAL_TABLET | Freq: Two times a day (BID) | ORAL | Status: DC

## 2024-07-21 MED ORDER — PROPOFOL 1000 MG/100ML IV EMUL
0.0000 ug/kg/min | INTRAVENOUS | Status: DC
  Administered 2024-07-21: 20 ug/kg/min via INTRAVENOUS
  Filled 2024-07-21: qty 100

## 2024-07-21 MED ORDER — FENTANYL CITRATE (PF) 50 MCG/ML IJ SOSY: 25.0000 ug | PREFILLED_SYRINGE | Freq: Once | INTRAMUSCULAR | Status: DC

## 2024-07-21 MED ORDER — SODIUM CHLORIDE 0.9% IV SOLUTION: Freq: Once | INTRAVENOUS | Status: AC

## 2024-07-21 MED ORDER — IOHEXOL 350 MG/ML SOLN
75.0000 mL | Freq: Once | INTRAVENOUS | Status: AC | PRN
  Administered 2024-07-21: 75 mL via INTRAVENOUS

## 2024-07-21 MED ORDER — PHENYLEPHRINE 80 MCG/ML (10ML) SYRINGE FOR IV PUSH (FOR BLOOD PRESSURE SUPPORT)
PREFILLED_SYRINGE | INTRAVENOUS | Status: AC
  Filled 2024-07-21: qty 10

## 2024-07-21 MED ORDER — CLONAZEPAM 0.25 MG PO TBDP: 0.5000 mg | ORAL_TABLET | Freq: Two times a day (BID) | ORAL | Status: DC | PRN

## 2024-07-21 MED ORDER — ETOMIDATE 2 MG/ML IV SOLN
20.0000 mg | Freq: Once | INTRAVENOUS | Status: AC
  Administered 2024-07-21: 20 mg via INTRAVENOUS

## 2024-07-21 MED ORDER — FENTANYL 2500MCG IN NS 250ML (10MCG/ML) PREMIX INFUSION
0.0000 ug/h | INTRAVENOUS | Status: DC
  Administered 2024-07-21: 25 ug/h via INTRAVENOUS
  Filled 2024-07-21: qty 250

## 2024-07-21 MED ORDER — NOREPINEPHRINE 4 MG/250ML-% IV SOLN
0.0000 ug/min | INTRAVENOUS | Status: AC
  Administered 2024-07-21: 2 ug/min via INTRAVENOUS

## 2024-07-21 MED ORDER — FENTANYL BOLUS VIA INFUSION
25.0000 ug | INTRAVENOUS | Status: DC | PRN
  Administered 2024-07-22: 100 ug via INTRAVENOUS
  Administered 2024-07-22 (×3): 50 ug via INTRAVENOUS
  Administered 2024-07-22: 100 ug via INTRAVENOUS

## 2024-07-21 MED ORDER — SODIUM CHLORIDE 0.9 % IV SOLN: 250.0000 mL | INTRAVENOUS | Status: AC

## 2024-07-21 MED ORDER — NOREPINEPHRINE 4 MG/250ML-% IV SOLN
INTRAVENOUS | Status: AC
  Filled 2024-07-21: qty 250

## 2024-07-21 MED ORDER — FAMOTIDINE 20 MG PO TABS
20.0000 mg | ORAL_TABLET | Freq: Two times a day (BID) | ORAL | Status: DC
  Administered 2024-07-21 – 2024-07-22 (×2): 20 mg
  Filled 2024-07-21 (×2): qty 1

## 2024-07-21 MED ORDER — ROCURONIUM BROMIDE 10 MG/ML (PF) SYRINGE
PREFILLED_SYRINGE | INTRAVENOUS | Status: AC
  Filled 2024-07-21: qty 10

## 2024-07-21 NOTE — Consult Note (Signed)
 "  NAME:  VED MARTOS, MRN:  [id number], DOB:  09-26-1945, LOS: 8 ADMISSION DATE:  07/11/2024, CONSULTATION DATE:  07/21/24 REFERRING MD:  Francesco, CHIEF COMPLAINT:  GIB   History of Present Illness:  Johnrobert Foti is a 79 y.o. M with PMH significant for COPD on 3L home O2, AAA s/p repair in 2025, CAD on plavix  and asa, Atrial fibrillation not on anticoagulation, CKD 3, stage 1B non-small cell lung cancer s/p RUL lobectomy in 2007, chronic bladder outlet obstruction with indwelling foley, Hx of ETOH abuse and Barrett's esophagus who presented initially on 1/25 with chest pain and dyspnea secondary to COPD exacerbation.  This has now improved, however started experiencing melena and underwent an  EGD on 1/28 and ampullary adenoma and gastritis biopsies completed.    He continued to have melanotic stools, so had another EGD on 2/3 which showed oozing from the biopsy sites, on 2/2 a capsule endoscopy showed red blood in the stomach and small bowel.  These were clipped, however bleeding persisted and between 2/3 and 2/4 pt was given 1 unit PRBC's, 4 platelets, 2 FFP.  A CTA bleeding scan was ordered and critical care consulted in the setting of persistent GIB.   Hgb dropped to 6.7, improved to 7.6 with the above.  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   Significant Hospital Events: Including procedures, antibiotic start and stop dates in addition to other pertinent events   1/25 admit to IMTS  2/4 persistent GIB after EGD and bleeding sites were clipped, CTA bleeding scan and ICU consult  Interim History / Subjective:  Pt has had multiple episodes of dark red liquid blood today, RN estimates last one was 300cc MAP 100's, patient alert, denies abdominal pain  Objective    Blood pressure (!) 149/71, pulse 73, temperature 97.9 F (36.6 C), temperature source Oral, resp. rate 17, height 6' (1.829 m), weight 61.3 kg, SpO2 100%.        Intake/Output Summary (Last 24 hours) at 07/21/2024 1334 Last data filed at 07/21/2024 1109 Gross per 24 hour  Intake 1355 ml  Output 500 ml  Net 855 ml   Filed Weights   07/11/24 0522 07/12/24 1624  Weight: 60 kg 61.3 kg   General:  thin, ill appearing M resting in bed in NAD HEENT: MM pink/moist, sclera anicteric, PERRLA  Neuro: alert and oriented CV: s1s2 rrr, no m/r/g PULM:  clear bilaterally on [place] oxygen  GI: soft, non-tender to palpation  Extremities: warm/dry, no edema     Resolved problem list   Assessment and Plan   ABLA secondary to GIB Chronic anemia and thrombocytopenia  S/p EGD which showed oozing from prior bx sites, also had recent polypectomy CTA without clear source  Most recent Hgb 7.6, received 1 unit PRBC's, 4  platelets and 2 FFP since 2/3 -with ongoing bleeding will transfer to the ICU for close monitoring  -GI following and will re-evaluate shortly  -repeat CBC pending -check TEG -NPO -continue protonix  40mg  bid  -holding plavix  -hematology consult for any further persistent thrombocytopenia recs   Chronic Hypoxic  Respiratory Failure secondary to COPD on 3L home O2 Tobacco abuse History of stage I squamous cell lung cancer-surgically resected  -follows with Dr. Zaida, on Stiolto Respimat   -continued tobacco cessation counseling when appropriate -continue [place] to maintain sats >88-90% -continue brovana  and incruse ellipta    PAD Stable AAA Initial concern for possible endoleak on CT  -seen by vascular, no need for intervention -avoid HTN, continue to monitor    Persistent Atrial Fibrillation  CAD Hx Trifascicular Block -rate controlled at home with metoprolol  -not anti-coagulated secondary to the hx of GIB -follows with EP outpatient  -hold bb in the setting of GIB and possible developing shock   Labs   CBC: Recent Labs  Lab 07/19/24 2252 07/20/24 0712 07/20/24 1732 07/21/24 0354 07/21/24 0821  WBC 6.3 6.3 7.5 8.8 9.7  NEUTROABS 4.3 4.2 4.9 6.4 7.8*  HGB 9.1* 7.9* 6.7* 8.0* 7.6*  HCT 26.8* 23.8* 20.4* 23.6* 23.3*  MCV 88.2 88.8 90.3 86.1 86.3  PLT 47* 50* 47* 45* 67*    Basic Metabolic Panel: Recent Labs  Lab 07/17/24 0947 07/18/24 0658 07/19/24 0603 07/20/24 0712 07/21/24 0821  NA 140 139 139 138 138  K 4.6 4.5 5.0 4.9 4.7  CL 108 107 109 109 106  CO2 22 23 21* 24 25  GLUCOSE 74 103* 80 94 130*  BUN 35* 39* 41* 34* 33*  CREATININE 1.41* 1.59* 1.58* 1.39* 1.38*  CALCIUM  8.7* 8.4* 7.9* 8.2* 8.0*   GFR: Estimated Creatinine Clearance: 38.3 mL/min (A) (by C-G formula based on SCr of 1.38 mg/dL (H)). Recent Labs  Lab 07/18/24 1917 07/19/24 0506 07/20/24 0712 07/20/24 1732 07/21/24 0354 07/21/24 0821  WBC 5.4   < > 6.3 7.5 8.8 9.7  LATICACIDVEN 1.7  --   --   --   --   --    < > = values in this interval not displayed.    Liver Function Tests: No results for input(s): AST, ALT, ALKPHOS, BILITOT, PROT, ALBUMIN  in the last 168 hours. No results for input(s): LIPASE, AMYLASE in the last 168 hours. No results for input(s): AMMONIA in the last 168  hours.  ABG    Component Value Date/Time   PHART 7.332 (L) 05/26/2024 0803   PCO2ART 45.0 05/26/2024 0803   PO2ART 98 05/26/2024 0803   HCO3 23.9 07/11/2024 0528   TCO2 25 07/11/2024 0528   ACIDBASEDEF 3.0 (H) 07/11/2024 0528   O2SAT 61 07/11/2024 0528     Coagulation Profile: Recent Labs  Lab 07/18/24 2105 07/21/24 0821  INR 1.3* 1.2    Cardiac Enzymes: No results for input(s): CKTOTAL, CKMB, CKMBINDEX, TROPONINI in the last 168 hours.  HbA1C: Hemoglobin A1C  Date/Time Value Ref Range Status  09/23/2022 10:32 AM 5.9 (A) 4.0 - 5.6 % Final   Hgb A1c MFr Bld  Date/Time Value Ref Range Status  04/27/2021 08:48 AM 5.5 4.8 - 5.6 % Final    Comment:    (NOTE) Pre diabetes:          5.7%-6.4%  Diabetes:              >6.4%  Glycemic control for   <7.0% adults with diabetes   11/07/2020 06:18  AM 5.4 4.8 - 5.6 % Final    Comment:    (NOTE)         Prediabetes: 5.7 - 6.4         Diabetes: >6.4         Glycemic control for adults with diabetes: <7.0     CBG: No results for input(s): GLUCAP in the last 168 hours.  Review of Systems:   Please see the history of present illness. 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   Surgical History:   Past Surgical History:  Procedure Laterality Date   ABDOMINAL AORTIC ENDOVASCULAR FENESTRATED STENT GRAFT N/A 08/16/2022   Procedure: ABDOMINAL AORTIC ENDOVASCULAR FENESTRATED STENT GRAFT;  Surgeon: [name] ORN, MD;  Location: [facility] OR;  Service: Vascular;  Laterality: N/A;   ABDOMINAL AORTIC ENDOVASCULAR FENESTRATED STENT GRAFT N/A 12/02/2023   Procedure: ABDOMINAL AORTIC ENDOVASCULAR FENESTRATED STENT GRAFT WITH ULTRASOUND GUIDED ACCESS;  Surgeon: [name] ORN, MD;  Location: [facility] INVASIVE CV LAB;  Service: Cardiovascular;  Laterality: N/A;   BIOPSY  11/12/2020   Procedure: BIOPSY;  Surgeon: Legrand Victory LITTIE DOUGLAS, MD;  Location: [facility] ENDOSCOPY;  Service: Gastroenterology;;   BIOPSY  07/25/2021   Procedure: BIOPSY;  Surgeon: Stacia Glendia BRAVO, MD;  Location: [facility] ENDOSCOPY;  Service: Gastroenterology;;   BIOPSY  12/24/2021   Procedure: BIOPSY;  Surgeon: Wilhelmenia Aloha Raddle., MD;  Location: [facility] ENDOSCOPY;  Service: Gastroenterology;;   BIOPSY OF SKIN SUBCUTANEOUS TISSUE AND/OR MUCOUS MEMBRANE  07/14/2024   Procedure: BIOPSY, SKIN, SUBCUTANEOUS TISSUE, OR MUCOUS MEMBRANE;  Surgeon: Wilhelmenia Aloha Raddle., MD;  Location: [facility] ENDOSCOPY;  Service: Gastroenterology;;   BRONCHIAL BIOPSY  07/27/2021   Procedure: BRONCHIAL BIOPSIES;  Surgeon: Shelah Lamar RAMAN, MD;  Location: [facility] ENDOSCOPY;  Service: Cardiopulmonary;;   BRONCHIAL BRUSHINGS  07/27/2021   Procedure: BRONCHIAL BRUSHINGS;  Surgeon: Shelah Lamar RAMAN, MD;  Location: [facility] ENDOSCOPY;  Service: Cardiopulmonary;;   cataract surgery Bilateral 2018   removal of cataracts   COLONOSCOPY     hx polyps - [place] 02/2012   COLONOSCOPY N/A 07/17/2024   Procedure: COLONOSCOPY;  Surgeon: Wilhelmenia Aloha Raddle., MD;  Location: [facility] ENDOSCOPY;  Service: Gastroenterology;  Laterality: N/A;   COLONOSCOPY WITH PROPOFOL  N/A 11/12/2020   Procedure: COLONOSCOPY WITH PROPOFOL ;  Surgeon: Legrand Victory LITTIE DOUGLAS,  MD;  Location: [facility] ENDOSCOPY;  Service: Gastroenterology;  Laterality: N/A;   CORONARY ANGIOPLASTY WITH STENT PLACEMENT     ENDOVASCULAR STENT INSERTION Bilateral 08/16/2022   Procedure: ENDOVASCULAR STENT GRAFT INSERTION OF BILATERAL RENAL ARTERIES;  Surgeon: [name] ORN, MD;  Location: [facility] OR;  Service: Vascular;  Laterality: Bilateral;   ESOPHAGOGASTRODUODENOSCOPY N/A 07/14/2024   Procedure: EGD (ESOPHAGOGASTRODUODENOSCOPY);  Surgeon: Wilhelmenia Aloha Raddle., MD;  Location: [facility] ENDOSCOPY;  Service: Gastroenterology;  Laterality: N/A;   ESOPHAGOGASTRODUODENOSCOPY N/A 07/20/2024   Procedure: EGD (ESOPHAGOGASTRODUODENOSCOPY);  Surgeon: Leigh Elspeth SQUIBB, MD;  Location: [facility] ENDOSCOPY;  Service: Gastroenterology;  Laterality: N/A;   ESOPHAGOGASTRODUODENOSCOPY (EGD) WITH PROPOFOL  N/A 11/12/2020   Procedure: ESOPHAGOGASTRODUODENOSCOPY (EGD) WITH PROPOFOL ;  Surgeon: Legrand Victory LITTIE DOUGLAS, MD;  Location: [facility] ENDOSCOPY;  Service: Gastroenterology;  Laterality: N/A;   ESOPHAGOGASTRODUODENOSCOPY (EGD) WITH PROPOFOL  N/A 03/06/2021   Procedure: ESOPHAGOGASTRODUODENOSCOPY (EGD) WITH PROPOFOL ;  Surgeon: Leigh Elspeth SQUIBB, MD;  Location: [facility] ENDOSCOPY;  Service: Gastroenterology;  Laterality: N/A;   ESOPHAGOGASTRODUODENOSCOPY (EGD) WITH PROPOFOL  N/A 07/25/2021   Procedure: ESOPHAGOGASTRODUODENOSCOPY (EGD) WITH PROPOFOL ;  Surgeon: Stacia Glendia BRAVO, MD;  Location: [facility] ENDOSCOPY;  Service: Gastroenterology;  Laterality: N/A;   ESOPHAGOGASTRODUODENOSCOPY (EGD) WITH PROPOFOL  N/A 12/24/2021   Procedure: ESOPHAGOGASTRODUODENOSCOPY (EGD) WITH PROPOFOL ;  Surgeon: Wilhelmenia Aloha Raddle., MD;  Location: [facility] ENDOSCOPY;  Service: Gastroenterology;  Laterality: N/A;   EUS N/A 07/14/2024   Procedure: ULTRASOUND, UPPER GI TRACT, ENDOSCOPIC;  Surgeon: Wilhelmenia Aloha Raddle., MD;  Location: [facility] ENDOSCOPY;  Service: Gastroenterology;  Laterality: N/A;   flexible cystourethroscopy  05/24/06   GIVENS CAPSULE STUDY N/A 07/18/2024    Procedure: IMAGING PROCEDURE, GI TRACT, INTRALUMINAL, VIA CAPSULE;  Surgeon: Mansouraty, Aloha Raddle., MD;  Location: [facility] ENDOSCOPY;  Service: Gastroenterology;  Laterality: N/A;   HEMOSTASIS CLIP PLACEMENT  03/06/2021   Procedure: HEMOSTASIS CLIP PLACEMENT;  Surgeon: Leigh Elspeth SQUIBB, MD;  Location: [facility] ENDOSCOPY;  Service: Gastroenterology;;   HEMOSTASIS CLIP PLACEMENT  12/24/2021   Procedure: HEMOSTASIS CLIP PLACEMENT;  Surgeon: Wilhelmenia Aloha Raddle., MD;  Location: [facility] ENDOSCOPY;  Service: Gastroenterology;;   HEMOSTASIS CLIP PLACEMENT  07/20/2024   Procedure: CONTROL OF HEMORRHAGE, GI TRACT, ENDOSCOPIC, BY CLIPPING OR OVERSEWING;  Surgeon: Leigh Elspeth SQUIBB, MD;  Location: [facility] ENDOSCOPY;  Service: Gastroenterology;;   HOT HEMOSTASIS N/A 03/06/2021   Procedure: HOT HEMOSTASIS (ARGON PLASMA COAGULATION/BICAP);  Surgeon: Leigh Elspeth SQUIBB, MD;  Location: [facility] ENDOSCOPY;  Service: Gastroenterology;  Laterality: N/A;   INGUINAL HERNIA REPAIR  2005   INSERTION OF ILIAC STENT Bilateral 08/16/2022   Procedure: INSERTION OF ILIAC STENT RIGHT EXTERNAL ARTERY, LEFT COMMON ARTERY, AND RIGHT COMMON ARTERY;  Surgeon: [name] ORN, MD;  Location: [facility] OR;  Service: Vascular;  Laterality: Bilateral;   IR CATHETER TUBE CHANGE  07/08/2022   LUNG LOBECTOMY Right 11/07   RUL   MULTIPLE TOOTH EXTRACTIONS     POLYPECTOMY  11/12/2020   Procedure: POLYPECTOMY;  Surgeon: Legrand Victory LITTIE DOUGLAS, MD;  Location: [facility] ENDOSCOPY;  Service: Gastroenterology;;   POLYPECTOMY  12/24/2021   Procedure: POLYPECTOMY;  Surgeon: Wilhelmenia Aloha Raddle., MD;  Location: [facility] ENDOSCOPY;  Service: Gastroenterology;;   POLYPECTOMY  07/17/2024   Procedure: POLYPECTOMY, INTESTINE;  Surgeon: Wilhelmenia Aloha Raddle., MD;  Location: [facility] ENDOSCOPY;  Service: Gastroenterology;;   SUBMUCOSAL LIFTING INJECTION  12/24/2021   Procedure: SUBMUCOSAL LIFTING INJECTION;  Surgeon: Wilhelmenia Aloha Raddle., MD;  Location: [facility] ENDOSCOPY;  Service: Gastroenterology;;    TRANSURETHRAL RESECTION OF PROSTATE N/A 05/02/2021   Procedure: TRANSURETHRAL RESECTION OF THE PROSTATE (TURP);  Surgeon: Carolee Sherwood JONETTA DOUGLAS, MD;  Location: [facility] ORS;  Service: Urology;  Laterality: N/A;   ULTRASOUND GUIDANCE FOR VASCULAR ACCESS Bilateral 08/16/2022   Procedure: ULTRASOUND GUIDANCE FOR VASCULAR ACCESS;  Surgeon: [name] ORN, MD;  Location: [facility] OR;  Service: Vascular;  Laterality: Bilateral;   UPPER ESOPHAGEAL ENDOSCOPIC ULTRASOUND (EUS) N/A 12/24/2021   Procedure: UPPER ESOPHAGEAL ENDOSCOPIC ULTRASOUND (EUS);  Surgeon: Wilhelmenia Aloha Raddle., MD;  Location: [facility] ENDOSCOPY;  Service: Gastroenterology;  Laterality: N/A;   UPPER GASTROINTESTINAL ENDOSCOPY     VIDEO BRONCHOSCOPY  07/27/2021   Procedure: VIDEO BRONCHOSCOPY WITHOUT  FLUORO;  Surgeon: Shelah Lamar RAMAN, MD;  Location: [facility] ENDOSCOPY;  Service: Cardiopulmonary;;   VIDEO BRONCHOSCOPY Right 05/14/2024   Procedure: VIDEO BRONCHOSCOPY WITHOUT FLUORO;  Surgeon: Jude Harden GAILS, MD;  Location: [facility]OSCOPY;  Service: Cardiopulmonary;  Laterality: Right;     Social History:   reports that he has been smoking cigarettes. He has a 41.3 pack-year smoking history. He has never used smokeless tobacco. He reports that he does not currently use alcohol. He reports that he does not use drugs.   Family History:  His family history includes Arthritis in his brother; Diabetes in his mother and sister; Gout in his brother; Heart disease in his sister; Hypertension in his sister. There is no history of Colon cancer, Esophageal cancer, Rectal cancer, Stomach cancer, Colon polyps, Inflammatory bowel disease, Liver disease, or Pancreatic cancer.   Allergies Allergies[1]   Home Medications  Prior to Admission medications  Medication Sig Start Date End Date Taking? Authorizing Provider  albuterol  (VENTOLIN  HFA) 108 (90 Base) MCG/ACT inhaler Inhale 1-2 puffs into the lungs every 6 (six) hours as needed for wheezing or shortness of breath. 12/23/23   Yes [name], DO  amLODipine  (NORVASC ) 10 MG tablet Take 1 tablet (10 mg total) by mouth daily. 12/09/23 12/08/24 Yes [name], DO  aspirin  EC 81 MG tablet Take 1 tablet (81 mg total) by mouth daily. Swallow whole. 04/14/23  Yes [name] ORN, MD  clopidogrel  (PLAVIX ) 75 MG tablet Take 1 tablet (75 mg total) by mouth daily. 12/11/23  Yes Bethanie Cough, PA-C  escitalopram  (LEXAPRO ) 5 MG tablet Take 1 tablet (5 mg total) by mouth daily. 07/08/24 01/04/25 Yes [name], DO  HYDROcodone -acetaminophen  (NORCO) 7.5-325 MG tablet Take 1 tablet by mouth 2 (two) times daily as needed for moderate pain (pain score 4-6). Fill 30 days after last fill. 06/24/24  Yes [name], DO  metoprolol  succinate (TOPROL -XL) 25 MG 24 hr tablet Take 1 tablet (25 mg total) by mouth daily. 06/24/24 06/24/25 Yes [name], DO  mirtazapine  (REMERON ) 7.5 MG tablet Take 2 tablets (15 mg total) by mouth at bedtime. 03/24/24  Yes [name], DO  Pancrelipase , Lip-Prot-Amyl, (ZENPEP ) 20000-63000 units CPEP Take 3 capsules by mouth 3 (three) times daily before meals. 07/05/24  Yes [name], DO  pantoprazole  (PROTONIX ) 40 MG tablet TAKE 1 TABLET EVERY DAY FOR REFLUX 12/09/23  Yes [name], DO  predniSONE  (DELTASONE ) 50 MG tablet Take one tablet 13 hours, 7 hours, and 1 hour prior to scan. 06/23/24  Yes [name] ORN, MD  rosuvastatin  (CRESTOR ) 40 MG tablet Take 1 tablet (40 mg total) by mouth daily. 04/03/23  Yes Jerrell Cleatus Ned, MD  sacubitril -valsartan  (ENTRESTO ) 97-103 MG Take 1 tablet by mouth 2 (two) times daily. 04/19/24  Yes [name], DO  spironolactone  (ALDACTONE ) 25 MG tablet Take 0.5 tablets (12.5 mg total) by mouth daily. 06/07/24 09/05/24 Yes [name], Callie E, PA-C  Tiotropium Bromide-Olodaterol (STIOLTO RESPIMAT ) 2.5-2.5 MCG/ACT AERS Inhale 2 puffs into the lungs daily. 02/27/24  Yes Hattar, Zola SAILOR, MD  diphenhydrAMINE  (BENADRYL ) 50 MG capsule Take one capsule 1 hour  prior to scan. Patient not taking: Reported on 07/11/2024 06/23/24   [name] ORN, MD  furosemide  (LASIX ) 40 MG tablet Take 1 tablet (40 mg total) by mouth daily. Patient not taking: Reported on 07/11/2024 05/29/24   Harrie Bruckner, DO  OXYGEN Inhale 3 L/min into the lungs continuous.    [provider]     Critical care time: 60 minutes     CRITICAL CARE Performed by: Leita SAUNDERS Carston Riedl   Total critical care time: 60 minutes  Critical care time was exclusive of separately billable procedures and treating other patients.  Critical care was necessary to treat or prevent imminent or life-threatening deterioration.  Critical care was time spent personally by me on the following activities: development of treatment plan with patient and/or surrogate as well as nursing, discussions with consultants, evaluation of patient's response to treatment, examination of patient, obtaining history from patient or surrogate, ordering and performing treatments and interventions, ordering and review of laboratory studies, ordering and review of radiographic studies, pulse oximetry and re-evaluation of patient's condition.   Leita SAUNDERS Evelia Waskey, PA-C [facility] 7a-7p: For contact information, see AMION. If no response to pager, please call PCCM 2-H APP. After 7p: Please call PCCM APP on-call for 2-H.          [1]  Allergies Allergen Reactions   Candesartan Cilexetil-Hctz Other (See Comments)    Acute renal failure   Hydrochlorothiazide Other (See Comments)    Acute renal failure   Shellfish Allergy Other (See Comments)    Causes GOUT   Betadine [Povidone Iodine ] Itching   "

## 2024-07-21 NOTE — Plan of Care (Signed)

## 2024-07-21 NOTE — Progress Notes (Signed)
 Physical Therapy Treatment Patient Details Name: [name] MRN: [id number] DOB: [birth year] Today's Date: 07/21/2024   History of Present Illness Patient is a 79 yo male presenting to the ED with chest pain and SOB on 07/11/24. Admitted with acute respiratory failure. Placed on 15L NRB. 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 Brought a portable step to practice in the room as the pt has been having frequent bloody bowel movements today, thus deferred out of room mobility this date. The pt is currently relying on bil UE support to ambulate and navigate stairs, demonstrating deficits in balance, endurance, and lower extremity strength. Will continue to follow acutely.    If plan is discharge home, recommend the following: Assist for transportation;Assistance with cooking/housework   Can travel by private vehicle        Equipment Recommendations  None recommended by PT    Recommendations for Other Services       Precautions / Restrictions Precautions Precautions: Fall Recall of Precautions/Restrictions: Intact Precaution/Restrictions Comments: watch O2; frequent bloody BMs Restrictions Weight Bearing Restrictions Per Provider Order: No     Mobility  Bed Mobility Overal bed mobility: Needs Assistance Bed Mobility: Supine to Sit, Sit to Supine     Supine to sit: HOB elevated, Used rails, Supervision Sit to supine: HOB elevated, Used rails, Supervision   General bed mobility comments: Supervision for safety and line management, no assistance physically needed. Pt used rails while HOB was elevated    Transfers Overall transfer level: Needs assistance Equipment used: 1 person hand held assist, Straight cane Transfers: Sit to/from Stand Sit to Stand: Min assist            General transfer comment: Pt stood from EOB 2x with SPC in R hand and L HHA minA to power up to stand and balance.    Ambulation/Gait Ambulation/Gait assistance: Min assist Gait Distance (Feet): 4 Feet Assistive device: Straight cane, 1 person hand held assist Gait Pattern/deviations: Step-through pattern, Decreased stride length, Trunk flexed, Narrow base of support Gait velocity: reduced Gait velocity interpretation: <1.31 ft/sec, indicative of household ambulator   General Gait Details: Pt took a few steps along EOB with SPC in R hand and L HHA light minA for balance. Pt with flexed trunk posture. Deferred further gait due frequent bloody BM, need for pericare, and to focus on stair training instead.   Stairs Stairs: Yes Stairs assistance: Min assist Stair Management: One rail Left, Step to pattern, With cane, Backwards, Forwards Number of Stairs: 3 General stair comments: Pt stepped up forward on the portable step with R hand holding SPC and L HHA light minA for balance. Pt stepped backwards back off the portable step.   Wheelchair Mobility     Tilt Bed    Modified Rankin (Stroke Patients Only)       Balance Overall balance assessment: Needs assistance Sitting-balance support: No upper extremity supported, Feet supported Sitting balance-Leahy Scale: Good Sitting balance - Comments: No LOB with dynamic sitting EOB with supervision for safety   Standing balance support: Bilateral upper extremity supported, During functional activity, Single extremity supported Standing balance-Leahy Scale: Poor Standing balance comment: reliant on Bil UE support to ambulate, only 1 UE support for static standing  Communication Communication Communication: No apparent difficulties  Cognition Arousal: Alert Behavior During Therapy: WFL for tasks assessed/performed   PT - Cognitive impairments: Safety/Judgement                       PT  - Cognition Comments: Needs cues to wait for therapist to arrange lines intermittently Following commands: Intact      Cueing Cueing Techniques: Verbal cues, Tactile cues  Exercises General Exercises - Lower Extremity Hip Flexion/Marching: AROM, Both, Other reps (comment), Standing (x3 reps bil with bil UE support)    General Comments        Pertinent Vitals/Pain Pain Assessment Pain Assessment: Faces Faces Pain Scale: Hurts a little bit Pain Location: buttocks with pericare Pain Descriptors / Indicators: Discomfort, Sore Pain Intervention(s): Limited activity within patient's tolerance, Monitored during session    Home Living                          Prior Function            PT Goals (current goals can now be found in the care plan section) Acute Rehab PT Goals Patient Stated Goal: return home PT Goal Formulation: With patient Time For Goal Achievement: 07/26/24 Potential to Achieve Goals: Good Progress towards PT goals: Progressing toward goals    Frequency    Min 1X/week      PT Plan      Co-evaluation              AM-PAC PT 6 Clicks Mobility   Outcome Measure  Help needed turning from your back to your side while in a flat bed without using bedrails?: None Help needed moving from lying on your back to sitting on the side of a flat bed without using bedrails?: A Little Help needed moving to and from a bed to a chair (including a wheelchair)?: A Little Help needed standing up from a chair using your arms (e.g., wheelchair or bedside chair)?: A Little Help needed to walk in hospital room?: A Little Help needed climbing 3-5 steps with a railing? : A Little 6 Click Score: 19    End of Session Equipment Utilized During Treatment: Oxygen Activity Tolerance: Patient tolerated treatment well Patient left: in bed;with call bell/phone within reach;with bed alarm set;with nursing/sitter in room Nurse Communication: Mobility status (NT and RN  in room during session) PT Visit Diagnosis: Other abnormalities of gait and mobility (R26.89);Muscle weakness (generalized) (M62.81);Unsteadiness on feet (R26.81)     Time: 1202-1222 PT Time Calculation (min) (ACUTE ONLY): 20 min  Charges:    $Gait Training: 8-22 mins PT General Charges $$ ACUTE PT VISIT: 1 Visit                     Theo Ferretti, PT, DPT Acute Rehabilitation Services  Office: [phone]    Theo CHRISTELLA Ferretti 07/21/2024, 1:38 PM

## 2024-07-21 NOTE — Progress Notes (Signed)
 Initial Nutrition Assessment  DOCUMENTATION CODES:   Severe malnutrition in context of chronic illness, Underweight  INTERVENTION:  If pt unable to advance past clear liquids, recommend initiation of TPN once stable; pt has been without adequate nutrition for most of admission (only 2 days of adequate intake in the 10 day admission) Parenteral Nutrition - titrate to meet estimated nutrition needs.  Kcal: 1700-1900 Protein: 70-85 g   Obtain new weight, no weight since admission  Pt at risk for refeeding syndrome given inadequate intake for > 7 days Recommend thiamine  100 mg when diet advanced/ dextrose  given Continued monitoring of electrolytes until stable, MD to replete as needed  NUTRITION DIAGNOSIS:   Severe Malnutrition related to chronic illness as evidenced by severe fat depletion, severe muscle depletion.  GOAL:   Patient will meet greater than or equal to 90% of their needs  MONITOR:   Labs, Weight trends, Diet advancement  REASON FOR ASSESSMENT:   NPO/Clear Liquid Diet    ASSESSMENT:   Pt with hx of COPD on 3L O2, HLD, HTN, alcohol abuse, AAA, GERD. Admitted with SOB and acute respiratory failure.  1/25 admitted, NPO 1/27 FLD 1/28 NPO s/p EUS, EGD; then Heart Healthy 1/30 CLD 1/31 NPO then CLD; s/p colonoscopy 2/1 NPO 2/3 CLD; EGD active bleeding 2/4 NPO  Spoke with pt who was resting in bed at time of assessment. Pt reports no current GI discomforts but states he constantly feels like he needs to have a bowel movement, had 4 type 7 movements today which were red in color w/ blood clots. Active bleeding seen on upper GI yesterday and had bleeding over night. Repeat xray today to assess if pt passed capsule. GI recommends NPO given pt showing signs of continued significant bleeding.  Pt reports he is very hungry and that is only complaint at this time. PTA, pt reports he was eating regularly, 2-3x per day. Pt reports he is a big meat eater and likes steak,  chicken, beef. Pt also drinks 2-3 Ensures per day.   Pt reports significant weight loss in the last year, states he has lost 68#. Per chart review, appears pt has lost about 8% body weight in last year. Unable to confirm stated weight loss at this time but 8% loss not significant for the timeframe. However, pt's physical exam shows severe muscle and severe fat depletion, indicative of severe malnutrition. Suspect malnutrition related to increased needs associated with chronic illness and suspect pt has not been meeting nutrition needs for some time.   Pt is at risk for continued worsening of malnutrition if nutrition support is not initiated soon. Pt at a high refeeding risk and recommend adding thiamine  100 mg daily and monitoring electrolyte labs closely. Recommend initiation of TPN as pt has been without adequate nutrition for > 7 days during admission and unable to advance past clears with continued bleeding.   Discussed pt care plan with MD and GI and expressed nutrition concerns. Pt likely transferring to ICU given continued significant bleeding. Nutrition will be assessed once pt plan becomes more clear after ICU transfer. Possible need for emergent intervention today.  Nutritionally Relevant Medications: Pepcid  Creon  TID Protonix  BID  Labs reviewed: BUN 33/ Cr 1.38 Hgb 7.6  Admit weight: 61.3 kg (1/26) Needs new weight   NUTRITION - FOCUSED PHYSICAL EXAM:  Flowsheet Row Most Recent Value  Orbital Region Severe depletion  Upper Arm Region Severe depletion  Thoracic and Lumbar Region Severe depletion  Buccal Region Moderate depletion  Temple Region Severe depletion  Clavicle Bone Region Severe depletion  Clavicle and Acromion Bone Region Severe depletion  Scapular Bone Region Severe depletion  Dorsal Hand Severe depletion  Patellar Region Severe depletion  Anterior Thigh Region Severe depletion  Posterior Calf Region Severe depletion  Edema (RD Assessment) None  Hair  Reviewed  Eyes Reviewed  Mouth Reviewed  [no teeth]  Skin Reviewed  Nails Reviewed    Diet Order:   Diet Order             Diet NPO time specified  Diet effective now                   EDUCATION NEEDS:   Education needs have been addressed  Skin:  Skin Assessment: Reviewed RN Assessment  Last BM:  2/4 type 7 x 4 (red w/ blood clots)  Height:   Ht Readings from Last 1 Encounters:  07/11/24 6' (1.829 m)    Weight:   Wt Readings from Last 1 Encounters:  07/12/24 61.3 kg    Ideal Body Weight:  80.9 kg  BMI:  Body mass index is 18.33 kg/m.  Estimated Nutritional Needs:   Kcal:  1700-1900  Protein:  85-100g  Fluid:  1.7-1.9L    Josette Glance, MS, RDN, LDN Clinical Dietitian I Please reach out via secure chat

## 2024-07-21 NOTE — Progress Notes (Signed)
 "  HD#8 SUBJECTIVE:  Patient Summary: [name] is a 79 y.o.  male with a pertinent PMH of COPD w/ chronic hypoxic respiratory failure on 3L home O2, AAA s/p repair 11/2023, Afib not on AC, HFimpEF, Stage Ib SCC of RLL, Prostate adenocarcinoma, HTN, CKD3, and TUD, who presented with chest pain and dyspnea and was admitted for acute on chronic hypoxic respiratory failure, now being treated for ABLA secondary to erosive gastritis and thrombocytopenia.   Overnight Events: Hgb from 6.7 increased to 8.0 after pRBCs. PLT low at 45 but this was drawn before PLT transfusion completed.  Interim History: Patient much more alert this morning, less drowsy compared to yesterday afternoon. Continuing to have several bowel movements with bright red blood in stool. He is using the bed pan so not able to tell if the VCE has passed in his stool. Denies any changes to his breathing. No SOB. No pain anywhere else either, denies CP.   OBJECTIVE:  Vital Signs: Vitals:   07/21/24 0937 07/21/24 0941 07/21/24 0959 07/21/24 1024  BP: 129/79 129/79 (!) 160/75   Pulse: 63 63 61 68  Resp: 16 20 15 18   Temp: 98.3 F (36.8 C) 98.3 F (36.8 C) 99.4 F (37.4 C)   TempSrc: Oral Oral Oral   SpO2:   100% 100%  Weight:      Height:       Supplemental O2: Nasal Cannula SpO2: 100 % O2 Flow Rate (L/min): 3 L/min FiO2 (%): 32 %  Filed Weights   07/11/24 0522 07/12/24 1624  Weight: 60 kg 61.3 kg     Intake/Output Summary (Last 24 hours) at 07/21/2024 1117 Last data filed at 07/21/2024 1109 Gross per 24 hour  Intake 1355 ml  Output 500 ml  Net 855 ml   Net IO Since Admission: 3,000.5 mL [07/21/24 1117]  Physical Exam: Constitutional: chronically ill-appearing, drowsy, cachectic elderly male in no acute distress HENT: normocephalic atraumatic, mucous membranes moist Eyes: conjunctiva non-erythematous, PERRL, no scleral icterus Cardiovascular: irregularly irregular rhythm with normal rate, no m/r/g; JVD not  elevated Pulmonary/Chest: normal work of breathing on home 3LNC, lungs clear to auscultation bilaterally - no rales Abdominal: soft, non-tender, non-distended, bowel sounds normal Neurological: alert & oriented x3 MSK: no BLE edema Skin: warm and dry Psych: normal mood and affect, thought content normal  Patient Lines/Drains/Airways Status     Active Line/Drains/Airways     Name Placement date Placement time Site Days   Peripheral IV 07/18/24 20 G Posterior;Right Forearm 07/18/24  0135  Forearm  3   Peripheral IV 07/18/24 20 G 1 Anterior;Left Forearm 07/18/24  1143  Forearm  3   Suprapubic Catheter Latex 18 Fr. 07/08/22  1058  Latex  744            Pertinent labs and imaging:      Latest Ref Rng & Units 07/21/2024    8:21 AM 07/21/2024    3:54 AM 07/20/2024    5:32 PM  CBC  WBC 4.0 - 10.5 K/uL 9.7  8.8  7.5   Hemoglobin 13.0 - 17.0 g/dL 7.6  8.0  6.7   Hematocrit 39.0 - 52.0 % 23.3  23.6  20.4   Platelets 150 - 400 K/uL 67  45  47        Latest Ref Rng & Units 07/21/2024    8:21 AM 07/20/2024    7:12 AM 07/19/2024    6:03 AM  CMP  Glucose 70 - 99 mg/dL 869  94  80   BUN 8 - 23 mg/dL 33  34  41   Creatinine 0.61 - 1.24 mg/dL 8.61  8.60  8.41   Sodium 135 - 145 mmol/L 138  138  139   Potassium 3.5 - 5.1 mmol/L 4.7  4.9  5.0   Chloride 98 - 111 mmol/L 106  109  109   CO2 22 - 32 mmol/L 25  24  21    Calcium  8.9 - 10.3 mg/dL 8.0  8.2  7.9     No results found.  ASSESSMENT/PLAN:  Assessment: Principal Problem:   Acute hypoxic respiratory failure (HCC) Active Problems:   Chest pain   Shortness of breath   Erosive gastritis   Acute blood loss anemia   Antiplatelet or antithrombotic long-term use   Transfusion-dependent anemia   Polyposis of colon   Acute on chronic anemia   Hx of adenomatous colonic polyps   Acute gastric ulcer with hemorrhage   Plan: #Acute Blood Loss Anemia #Ampullary Adenoma #Thrombocytopenia Patient's hemoglobin continues downtrend after  repeated transfusions. Platelets continue to stay low as well, requiring platelet transfusions. PT/INR and PTT are also elevated with low fibrinogen. Patient has required significant number of blood transfusions > 10 and we will continue to stabilize with pRBCs/PLT/FFP transfusions 1:1:1. Remains hemodynamically stable. GI bleeding likely from prior biopsy sites, but these were clipped during EGD and patient continues to have bright red blood. It is possible that he is having bleeding from removed polyps during colonoscopy due to inadequate platelets and coagulation factors. Thrombocytopenia is likely chronic issue as has been documented since 2023 with concerns for MDS. Due to repeated episodes of hematochezia despite FFP and platelet transfusions, will order CTA to localize bleeding source. Also awaiting KUB as patient has not passed VCE with his stool. Appreciate further GI recommendations. - STAT CTA abdomen/pelvis pending - Continue IV Protonix  40 mg bid x 60 days, then daily (started 07/15/24) - Continue Carafate  1g bid x 30 days (started 07/15/24) - Continue Pepcid  40 mg daily - Hold home ASA and Plavix  - Hold home Lexapro  and mirtazapine  - SCDs for DVT ppx - Continue CLD in light of continued bleeding - Outpatient GI follow up scheduled with Dr. Avram - Outpatient heme/onc follow up for pancytopenia - Trend CBC q8h, trend PT/INR, PTT, Fibrinogen daily (transfuse RBC/PLT/FFP in 1:1:1 ratio)  #History of Hypertension #Hypotension BP rising s/p transfusions, though still is euvolemic on evaluation today. His home medications include amlodipine  10 mg, Toprol -XL 25 mg, Entresto  97-103 mg, and spironolactone  12.5 mg. Prior hypotension in the setting of bleeding. Will continue holding home medications and trending pressures. If ever becomes volume overloaded, would suspect this is cause of hypertension. - Hold home metoprolol , amlodipine , spironolactone , and Entresto    #Acute on chronic hypoxic  respiratory failure #COPD on 3L [place] at home #HFmrEF Patient at baseline oxygen-dependence (3L [address]) without new dyspnea or chest pain despite ongoing blood loss. No desaturations on home regimen. Will continue to provide breathing treatments and hold home HF medications.  - Hold Entresto  and Spironolactone  d/t hypotension - Hold Toprol -XL d/t bradycardia - Brovana  Nebulizer 15 mcg bid + Incruse Ellipta    #CKD3a Patient baseline Cr of 1.5-1.6. Renal function stable. BUN elevated due to UGIB. - Trend BMPs  #Persistent Afib not on [facility]  Patient is in atrial fibrillation with rate control at home (metoprolol  succinate 25 mg daily). His CHA2DS2-VASc score is 5. Not on AC due to recurrent GI bleeding. Rates have been controlled  without medication. - Continuous cardiac monitoring - Hold home metoprolol  d/t to hypotension and bradycardia  #Possible Aortic EndoLeak seen on CTA #Aortic Aneurysm s/p repair Given patient's dyspnea, chest pain, and acute blood loss anemia, vascular surgery previously consulted for possible aortic endoleak seen on CTA. Dr. Pearline reviewed the case and has no concern for dissection or worsening AAA. He recommends follow up outpatient (scheduled for 07/26/24). Patient denies any new chest pain or dyspnea since admission. Getting repeat CTA due to ongoing blood loss. - Hold home ASA and antiplatelet therapy d/t anemia - Continue home rosuvastatin   #Prior NSTEMI #Aortic atherosclerosis No acute concerns but will continue to monitor given patient's risk factors.  - Continuous cardiac monitoring - For new chest pain, stat EKG and troponins - Hold home antiplatelet therapy - Continue home rosuvastatin   #Concern for Malnutrition #Cachexia Patient remains chronically ill-appearing with cachexia (BMI 18.33). Cachexia likely in the setting of known prostate adenocarcinoma + emphysema/COPD. His Ampulla of Vater adenoma may also be contributing if there has been malignant  transformation. Further workup of ampullary adenoma in outpatient setting (discussed above) - Ensure supplements bid b/w meals - Holding home mirtazapine  15 mg nightly d/t ABLA  #Chronic Suprapubic Catheter No acute concerns, will need new HHRN order for monthly catheter change.  Chronic Stable Medical Conditions   #Chronic Hip Pain - continue home Norco #TUD - continue nicotine  patches #GERD - see plan above for ABLA #HLD - continue home rosuvastatin  #Depression - holding home escitalopram   Best Practice: Diet: CLD IVF: Fluids: None VTE: Place and maintain sequential compression device Start: 07/12/24 1131 Code: Full  Disposition planning: Therapy Recs: None, DME: none Family Contact: sister, to be notified. DISPO: Anticipated discharge to Home pending stabilization of bleeding.  Signature:  Letha Cheadle, MD [facility]  PGY-1  11:17 AM, 07/21/2024  On Call pager [phone]   "

## 2024-07-21 NOTE — Progress Notes (Signed)
 "     Progress Note   Subjective  Reportedly with some bleeding overnight per nursing, but responded well to PRBC transfusion and BUN slightly lower than before. He received platelet transfusion and FFP. Vitals stable.    Objective   Vital signs in last 24 hours: Temp:  [97.6 F (36.4 C)-99.5 F (37.5 C)] 99.4 F (37.4 C) (02/04 0959) Pulse Rate:  [25-152] 61 (02/04 0959) Resp:  [6-36] 15 (02/04 0959) BP: (98-164)/(50-100) 160/75 (02/04 0959) SpO2:  [59 %-100 %] 100 % (02/04 1024) Last BM Date : 07/21/24 General:    AA male in NAD Psych:  Cooperative. Normal mood and affect.  Intake/Output from previous day: 02/03 0701 - 02/04 0700 In: 1445 [I.V.:200; Blood:995; IV Piggyback:250] Out: 500 [Urine:500] Intake/Output this shift: No intake/output data recorded.  Lab Results: Recent Labs    07/20/24 1732 07/21/24 0354 07/21/24 0821  WBC 7.5 8.8 9.7  HGB 6.7* 8.0* 7.6*  HCT 20.4* 23.6* 23.3*  PLT 47* 45* 67*   BMET Recent Labs    07/19/24 0603 07/20/24 0712 07/21/24 0821  NA 139 138 138  K 5.0 4.9 4.7  CL 109 109 106  CO2 21* 24 25  GLUCOSE 80 94 130*  BUN 41* 34* 33*  CREATININE 1.58* 1.39* 1.38*  CALCIUM  7.9* 8.2* 8.0*   LFT No results for input(s): PROT, ALBUMIN , AST, ALT, ALKPHOS, BILITOT, BILIDIR, IBILI in the last 72 hours. PT/INR Recent Labs    07/18/24 2105 07/21/24 0821  LABPROT 16.8* 16.1*  INR 1.3* 1.2    Studies/Results: No results found.     Assessment / Plan:    79 y/o male here with the following:  Melena  Post hemorrhagic anemia Thrombocytopenia Antiplatelet use on chronic plavix  - held AAA repair - ? Endovascular leak?  S/p EGD / EUS on 1/28 - large duodenal adenoma noted but no stigmata for bleeding. He had some gastritis / erosions and biopsies taken to rule out H pylori. Colonoscopy done 1/31 - fair prep, multiple polyps removed. Capsule endoscopy then done and interpreted 2/2 - blood in the stomach and  small bowel. Repeat EGD yesterday - at each gastric biopsy site there was adherent clot, one with oozing, thought to be culprit for his bleeding. Clips placed. He has had rebleeding in the setting of thrombocytopenia / coagulopathy. Given platelet transfusion and FFP.   Very difficult situation - clearly had bleeding from gastric biopsy sites in the setting of thrombocytopenia / coagulopathy. He could also be having colonic bleeding from polypectomy sites in his colon (which were numerous). Trend Hgb for now and await course following measures to treat his thrombocytopenia, hopefully he can resolve this with supportive measures. If further bleeding that persists would repeat CTA or tagged RBC scan to help localize. If coming from his colon polypectomy sites would need repeat colonoscopy. If small bowel source (blood noted throughout small bowel on exam, likely from gastric bleeding but could also have primary source there), that would be more challenging to address.  Would keep on clear liquids for now. Will do xray today to make sure capsule has passed into his colon given it has not been seen yet.  PLAN: - holding plavix  - transfuse to keep platelets > 50, defer to primary team / hematology workup for this  - trend Hgb - continue protonix  40mg  BID - abdominal xray - make sure capsule has passed into the colon - if persistent bleeding, repeat CTA or obtain tagged RBC scan - clear  liquid diet for now, would not advance beyond that   Will follow closely, please let me know if any significant bleeding in the interim. Discussed plan with primary team.  Marcey Naval, MD [facility]   "

## 2024-07-21 NOTE — Progress Notes (Signed)
 PCCM interval progress note:  Pt was transferred to the ICU and shortly thereafter became increasingly agitated and hypoxic, would not tolerate non-rebreather.  Indwelling suprapubic catheter would not flush, this was replaced but before lasix  could be given his respiratory status worsened to the point of needing intubation.    Repeat labs, CXR and ABG are pending. BP remains stable.   I have updated pt's sister Mazie and attempted to reach son Ozell without an answer.     Additional critical care time    Leita SAUNDERS Brayla Pat, PA-C

## 2024-07-21 NOTE — Progress Notes (Signed)
" °  Progress Note   Date: 07/15/2024  Patient Name: [name]        MRN#: [id number]  Clarification of diagnosis:  CKD Stage 3a     "

## 2024-07-21 NOTE — Progress Notes (Signed)
 After I rounded on the patient today, he developed further blood per rectum that increased.  Recommended CTA to further evaluate to see if we could localize this, and there is no active extravasation appreciated.  He has continued to have some bleeding today, transferred to ICU for higher level of care.  My concern is that he has thrombocytopenia / platelet dysfunction / coagulopathy contributing / causing his bleeding.  He had adherent clots and evidence of bleeding at each biopsy site in his stomach on EGD yesterday which is very unusual.  He additionally had numerous polypectomy sites in his colon from colonoscopy recently, he could be bleeding from any one of those as well.  There was blood in his small intestine on capsule study, that likely represented blood from his stomach or but possible he has another primary small bowel source there as well.  He is critically ill, transferred to the ICU for further supportive measures.  Recommend hematology consult for assistance with his thrombocytopenia.  His mental status has significantly altered since arrival to the ICU and has become tachycardic and anxious.  Critical care is intubating him to help protect his airway and sedate him.  OG will be placed and they will lavage to see if there is active bleeding in the stomach.  If the OG is negative, concerned he may be bleeding from his colon and this would be challenging to evaluate and treat acutely without a bowel prep.  ICU team has sent TEG panel and that is pending.  We will continue to follow closely, plan on repeating CBC now and transfuse PRBC if needed.  If patient acutely decompensates and active bleeding this evening please call the on-call physician this evening.  Again concerned hemostasis may be difficult if he is coagulopathic.  Transfuse platelets to keep greater than 50.  Marcey Naval, MD [facility]

## 2024-07-21 NOTE — Procedures (Signed)
 Intubation Procedure Note  [name]  [id number]  09/06/1945  Date:07/21/24  Time:5:50 PM   Provider Performing:Michaeljohn Biss V. Curties Conigliaro    Procedure: Intubation (31500)  Indication(s) Respiratory Failure  Consent Unable to obtain consent due to inability to find a medical decision maker for patient.  All reasonable efforts were made.  Another independent medical provider, Gleason APP , confirmed the benefits of this procedure outweigh the risks.   Anesthesia Etomidate , Versed , Fentanyl , and Rocuronium    Time Out Verified patient identification, verified procedure, site/side was marked, verified correct patient position, special equipment/implants available, medications/allergies/relevant history reviewed, required imaging and test results available.   Sterile Technique Usual hand hygeine, masks, and gloves were used   Procedure Description Patient positioned in bed supine.  Sedation given as noted above.  Patient was intubated with endotracheal tube using Glidescope.  View was Grade 1 full glottis .  Number of attempts was 1.  Colorimetric CO2 detector was consistent with tracheal placement.   Complications/Tolerance None; patient tolerated the procedure well. Chest X-ray is ordered to verify placement.   EBL Minimal   Specimen(s) None  [name] V. Jude MD

## 2024-07-21 NOTE — Procedures (Signed)
 Suprapubic catheter dc'd & new foley placed in its place in sterile manner Confirmed by return of clear urine  [name] V. Jude MD

## 2024-07-22 DIAGNOSIS — D62 Acute posthemorrhagic anemia: Secondary | ICD-10-CM | POA: Diagnosis not present

## 2024-07-22 DIAGNOSIS — J9601 Acute respiratory failure with hypoxia: Secondary | ICD-10-CM | POA: Diagnosis not present

## 2024-07-22 DIAGNOSIS — J9584 Transfusion-related acute lung injury (TRALI): Secondary | ICD-10-CM | POA: Diagnosis not present

## 2024-07-22 DIAGNOSIS — K921 Melena: Secondary | ICD-10-CM | POA: Diagnosis not present

## 2024-07-22 DIAGNOSIS — D696 Thrombocytopenia, unspecified: Secondary | ICD-10-CM | POA: Diagnosis not present

## 2024-07-22 DIAGNOSIS — Z7902 Long term (current) use of antithrombotics/antiplatelets: Secondary | ICD-10-CM | POA: Diagnosis not present

## 2024-07-22 LAB — PREPARE PLATELET PHERESIS
Unit division: 0
Unit division: 0
Unit division: 0
Unit division: 0
Unit division: 0

## 2024-07-22 LAB — CBC WITH DIFFERENTIAL/PLATELET
Abs Immature Granulocytes: 0.04 10*3/uL (ref 0.00–0.07)
Abs Immature Granulocytes: 0.08 10*3/uL — ABNORMAL HIGH (ref 0.00–0.07)
Basophils Absolute: 0 10*3/uL (ref 0.0–0.1)
Basophils Absolute: 0 10*3/uL (ref 0.0–0.1)
Basophils Relative: 0 %
Basophils Relative: 0 %
Eosinophils Absolute: 0.1 10*3/uL (ref 0.0–0.5)
Eosinophils Absolute: 0.1 10*3/uL (ref 0.0–0.5)
Eosinophils Relative: 1 %
Eosinophils Relative: 1 %
HCT: 24.4 % — ABNORMAL LOW (ref 39.0–52.0)
HCT: 24.6 % — ABNORMAL LOW (ref 39.0–52.0)
Hemoglobin: 8.1 g/dL — ABNORMAL LOW (ref 13.0–17.0)
Hemoglobin: 7.9 g/dL — ABNORMAL LOW (ref 13.0–17.0)
Immature Granulocytes: 0 %
Immature Granulocytes: 1 %
Lymphocytes Relative: 15 %
Lymphocytes Relative: 15 %
Lymphs Abs: 1.4 10*3/uL (ref 0.7–4.0)
Lymphs Abs: 1.7 10*3/uL (ref 0.7–4.0)
MCH: 28.9 pg (ref 26.0–34.0)
MCH: 28.9 pg (ref 26.0–34.0)
MCHC: 33.2 g/dL (ref 30.0–36.0)
MCHC: 32.1 g/dL (ref 30.0–36.0)
MCV: 87.1 fL (ref 80.0–100.0)
MCV: 90.1 fL (ref 80.0–100.0)
Monocytes Absolute: 1.2 10*3/uL — ABNORMAL HIGH (ref 0.1–1.0)
Monocytes Absolute: 1 10*3/uL (ref 0.1–1.0)
Monocytes Relative: 13 %
Monocytes Relative: 9 %
Neutro Abs: 6.5 10*3/uL (ref 1.7–7.7)
Neutro Abs: 8.4 10*3/uL — ABNORMAL HIGH (ref 1.7–7.7)
Neutrophils Relative %: 71 %
Neutrophils Relative %: 74 %
Platelets: 91 10*3/uL — ABNORMAL LOW (ref 150–400)
Platelets: 101 10*3/uL — ABNORMAL LOW (ref 150–400)
RBC: 2.8 MIL/uL — ABNORMAL LOW (ref 4.22–5.81)
RBC: 2.73 MIL/uL — ABNORMAL LOW (ref 4.22–5.81)
RDW: 20.3 % — ABNORMAL HIGH (ref 11.5–15.5)
RDW: 20.3 % — ABNORMAL HIGH (ref 11.5–15.5)
WBC: 9.2 10*3/uL (ref 4.0–10.5)
WBC: 11.3 10*3/uL — ABNORMAL HIGH (ref 4.0–10.5)
nRBC: 0 % (ref 0.0–0.2)
nRBC: 0 % (ref 0.0–0.2)

## 2024-07-22 LAB — PROTIME-INR
INR: 1.2 (ref 0.8–1.2)
Prothrombin Time: 15.7 s — ABNORMAL HIGH (ref 11.4–15.2)

## 2024-07-22 LAB — POCT I-STAT 7, (LYTES, BLD GAS, ICA,H+H)
Acid-base deficit: 1 mmol/L (ref 0.0–2.0)
Bicarbonate: 23.8 mmol/L (ref 20.0–28.0)
Calcium, Ion: 1.2 mmol/L (ref 1.15–1.40)
HCT: 23 % — ABNORMAL LOW (ref 39.0–52.0)
Hemoglobin: 7.8 g/dL — ABNORMAL LOW (ref 13.0–17.0)
O2 Saturation: 100 %
Patient temperature: 97.9
Potassium: 4.2 mmol/L (ref 3.5–5.1)
Sodium: 139 mmol/L (ref 135–145)
TCO2: 25 mmol/L (ref 22–32)
pCO2 arterial: 39.3 mmHg (ref 32–48)
pH, Arterial: 7.389 (ref 7.35–7.45)
pO2, Arterial: 335 mmHg — ABNORMAL HIGH (ref 83–108)

## 2024-07-22 LAB — BPAM PLATELET PHERESIS
Blood Product Expiration Date: 202602052359
Blood Product Expiration Date: 202602042359
Blood Product Expiration Date: 202602052359
Blood Product Unit Number: 202602042359
ISSUE DATE / TIME: 202602040403
ISSUE DATE / TIME: 202602041121
ISSUE DATE / TIME: 202602041228
ISSUE DATE / TIME: 202602052359
ISSUE DATE / TIME: 202602041228
PRODUCT CODE: 202602040915
PRODUCT CODE: 202602052359
Unit Type and Rh: 202602042359
Unit Type and Rh: 5100
Unit Type and Rh: 6200
Unit Type and Rh: 6200
Unit Type and Rh: 202602042359
Unit Type and Rh: 202602052359
Unit Type and Rh: 5100
Unit Type and Rh: 6200
Unit Type and Rh: 5100

## 2024-07-22 LAB — BLOOD GAS, VENOUS
Acid-Base Excess: 1.4 mmol/L (ref 0.0–2.0)
Acid-Base Excess: 0.9 mmol/L (ref 0.0–2.0)
Acid-base deficit: 2.6 mmol/L — ABNORMAL HIGH (ref 0.0–2.0)
Bicarbonate: 28.6 mmol/L — ABNORMAL HIGH (ref 20.0–28.0)
Bicarbonate: 29.4 mmol/L — ABNORMAL HIGH (ref 20.0–28.0)
Bicarbonate: 28.9 mmol/L — ABNORMAL HIGH (ref 20.0–28.0)
Drawn by: 23734
Drawn by: 8194
O2 Saturation: 100 %
O2 Saturation: 32.1 %
O2 Saturation: 47.5 %
Patient temperature: 36.9
Patient temperature: 36.5
Patient temperature: 36.6
pCO2, Ven: 84 mmHg (ref 44–60)
pCO2, Ven: 66 mmHg — ABNORMAL HIGH (ref 44–60)
pCO2, Ven: 59 mmHg (ref 44–60)
pH, Ven: 7.14 — CL (ref 7.25–7.43)
pH, Ven: 7.26 (ref 7.25–7.43)
pH, Ven: 7.3 (ref 7.25–7.43)
pO2, Ven: 115 mmHg — ABNORMAL HIGH (ref 32–45)
pO2, Ven: <31 mmHg — CL (ref 32–45)
pO2, Ven: 32 mmHg (ref 32–45)

## 2024-07-22 LAB — PLATELET FUNCTION ASSAY
Collagen / ADP: 183 s — ABNORMAL HIGH (ref 0–118)
Collagen / Epinephrine: 271 s — ABNORMAL HIGH (ref 0–193)

## 2024-07-22 LAB — PREPARE FRESH FROZEN PLASMA
Unit division: 0
Unit division: 0

## 2024-07-22 LAB — BPAM FFP
Blood Product Expiration Date: 202602042359
Blood Product Expiration Date: 202602062359
Blood Product Expiration Date: 202602092359
ISSUE DATE / TIME: 202602040920
ISSUE DATE / TIME: 202602041121
ISSUE DATE / TIME: 202602041228
Unit Type and Rh: 6200
Unit Type and Rh: 6200
Unit Type and Rh: 6200

## 2024-07-22 LAB — GLUCOSE, CAPILLARY
Glucose-Capillary: 103 mg/dL — ABNORMAL HIGH (ref 70–99)
Glucose-Capillary: 78 mg/dL (ref 70–99)
Glucose-Capillary: 72 mg/dL (ref 70–99)
Glucose-Capillary: 105 mg/dL — ABNORMAL HIGH (ref 70–99)
Glucose-Capillary: 125 mg/dL — ABNORMAL HIGH (ref 70–99)
Glucose-Capillary: 117 mg/dL — ABNORMAL HIGH (ref 70–99)
Glucose-Capillary: 112 mg/dL — ABNORMAL HIGH (ref 70–99)

## 2024-07-22 LAB — BASIC METABOLIC PANEL WITH GFR
Anion gap: 11 (ref 5–15)
BUN: 31 mg/dL — ABNORMAL HIGH (ref 8–23)
CO2: 23 mmol/L (ref 22–32)
Calcium: 8 mg/dL — ABNORMAL LOW (ref 8.9–10.3)
Chloride: 104 mmol/L (ref 98–111)
Creatinine, Ser: 1.41 mg/dL — ABNORMAL HIGH (ref 0.61–1.24)
GFR, Estimated: 51 mL/min — ABNORMAL LOW
Glucose, Bld: 79 mg/dL (ref 70–99)
Potassium: 4.6 mmol/L (ref 3.5–5.1)
Sodium: 138 mmol/L (ref 135–145)

## 2024-07-22 LAB — TRIGLYCERIDES: Triglycerides: 78 mg/dL

## 2024-07-22 LAB — ERYTHROPOIETIN: Erythropoietin: 41.5 m[IU]/mL — ABNORMAL HIGH (ref 2.6–18.5)

## 2024-07-22 LAB — PHOSPHORUS: Phosphorus: 4.9 mg/dL — ABNORMAL HIGH (ref 2.5–4.6)

## 2024-07-22 LAB — APTT: aPTT: 33 s (ref 24–36)

## 2024-07-22 MED ORDER — ACETAMINOPHEN 650 MG RE SUPP: 650.0000 mg | Freq: Four times a day (QID) | RECTAL | Status: AC | PRN

## 2024-07-22 MED ORDER — MIDAZOLAM HCL (PF) 2 MG/2ML IJ SOLN
2.0000 mg | Freq: Once | INTRAMUSCULAR | Status: AC
  Administered 2024-07-22: 2 mg via INTRAVENOUS

## 2024-07-22 MED ORDER — HYDROCODONE-ACETAMINOPHEN 7.5-325 MG PO TABS: 1.0000 | ORAL_TABLET | Freq: Two times a day (BID) | ORAL | Status: AC | PRN

## 2024-07-22 MED ORDER — BUDESONIDE 0.5 MG/2ML IN SUSP
0.5000 mg | Freq: Two times a day (BID) | RESPIRATORY_TRACT | Status: AC
  Administered 2024-07-22 – 2024-07-23 (×4): 0.5 mg via RESPIRATORY_TRACT
  Filled 2024-07-22 (×4): qty 2

## 2024-07-22 MED ORDER — PROPOFOL 1000 MG/100ML IV EMUL
0.0000 ug/kg/min | INTRAVENOUS | Status: DC
  Administered 2024-07-22: 10 ug/kg/min via INTRAVENOUS

## 2024-07-22 MED ORDER — DEXTROSE 10 % IV SOLN: INTRAVENOUS | Status: AC

## 2024-07-22 MED ORDER — OLANZAPINE 10 MG IM SOLR
2.5000 mg | Freq: Once | INTRAMUSCULAR | Status: AC | PRN
  Administered 2024-07-23: 2.5 mg via INTRAMUSCULAR
  Filled 2024-07-22: qty 10

## 2024-07-22 MED ORDER — ACETAMINOPHEN 325 MG PO TABS: 650.0000 mg | ORAL_TABLET | Freq: Four times a day (QID) | ORAL | Status: AC | PRN

## 2024-07-22 MED ORDER — CLONAZEPAM 0.25 MG PO TBDP
0.5000 mg | ORAL_TABLET | Freq: Two times a day (BID) | ORAL | Status: DC | PRN
  Administered 2024-07-22: 0.5 mg
  Filled 2024-07-22: qty 2

## 2024-07-22 MED ORDER — ROSUVASTATIN CALCIUM 20 MG PO TABS
40.0000 mg | ORAL_TABLET | Freq: Every day | ORAL | Status: DC
  Administered 2024-07-22: 40 mg
  Filled 2024-07-22: qty 2

## 2024-07-22 MED ORDER — SODIUM CHLORIDE 0.9% IV SOLUTION: Freq: Once | INTRAVENOUS | Status: AC

## 2024-07-22 MED ORDER — REVEFENACIN 175 MCG/3ML IN SOLN
175.0000 ug | Freq: Every day | RESPIRATORY_TRACT | Status: AC
  Administered 2024-07-22 – 2024-07-23 (×2): 175 ug via RESPIRATORY_TRACT
  Filled 2024-07-22 (×2): qty 3

## 2024-07-22 MED ORDER — MIDAZOLAM HCL 2 MG/2ML IJ SOLN
INTRAMUSCULAR | Status: AC
  Filled 2024-07-22: qty 2

## 2024-07-22 MED ORDER — ORAL CARE MOUTH RINSE
15.0000 mL | OROMUCOSAL | Status: AC | PRN
  Administered 2024-07-22: 15 mL via OROMUCOSAL

## 2024-07-22 MED ORDER — THIAMINE HCL 100 MG/ML IJ SOLN
100.0000 mg | Freq: Every day | INTRAMUSCULAR | Status: AC
  Administered 2024-07-22 – 2024-07-23 (×2): 100 mg via INTRAVENOUS
  Filled 2024-07-22 (×2): qty 2

## 2024-07-22 MED ORDER — DEXMEDETOMIDINE HCL IN NACL 400 MCG/100ML IV SOLN
0.0000 ug/kg/h | INTRAVENOUS | Status: AC
  Administered 2024-07-22: 0.4 ug/kg/h via INTRAVENOUS
  Administered 2024-07-23 (×2): 0.8 ug/kg/h via INTRAVENOUS
  Filled 2024-07-22 (×3): qty 100

## 2024-07-22 MED ORDER — FUROSEMIDE 10 MG/ML IJ SOLN
40.0000 mg | Freq: Once | INTRAMUSCULAR | Status: AC
  Administered 2024-07-22: 40 mg via INTRAVENOUS
  Filled 2024-07-22: qty 4

## 2024-07-22 NOTE — Progress Notes (Signed)
 "     Progress Note   Subjective  Patient transferred to ICU yesterday PM for ongoing bleeding. Developed altered mental status, intubated. Given blood, FFP and platelets yesterday - bleeding slowed down and none further reported overnight or this AM so far.    Objective   Vital signs in last 24 hours: Temp:  [97.6 F (36.4 C)-99.4 F (37.4 C)] 98 F (36.7 C) (02/05 0438) Pulse Rate:  [48-141] 70 (02/05 0500) Resp:  [14-44] 18 (02/05 0730) BP: (58-186)/(49-102) 99/61 (02/05 0730) SpO2:  [72 %-100 %] 100 % (02/05 0730) FiO2 (%):  [40 %-100 %] 40 % (02/05 0402) Weight:  [68.1 kg] 68.1 kg (02/04 1606) Last BM Date : 07/21/24 General:    AA male in NAD, intubated and sedated Abd - soft, ND  Intake/Output from previous day: 02/04 0701 - 02/05 0700 In: 3577.1 [P.O.:50; I.V.:291.1; Blood:3236] Out: 2150 [Urine:2100; Stool:50] Intake/Output this shift: No intake/output data recorded.  Lab Results: Recent Labs    07/21/24 0821 07/21/24 1804 07/21/24 2221  WBC 9.7 10.6* 11.7*  HGB 7.6* 8.9* 8.3*  HCT 23.3* 26.9* 24.8*  PLT 67* 125* 105*   BMET Recent Labs    07/20/24 0712 07/21/24 0821 07/21/24 1804  NA 138 138 137  K 4.9 4.7 4.4  CL 109 106 103  CO2 24 25 23   GLUCOSE 94 130* 110*  BUN 34* 33* 32*  CREATININE 1.39* 1.38* 1.28*  CALCIUM  8.2* 8.0* 8.5*   LFT Recent Labs    07/21/24 1804  PROT 5.7*  ALBUMIN  3.5  AST 82*  ALT 81*  ALKPHOS 62  BILITOT 0.9   PT/INR Recent Labs    07/21/24 0821  LABPROT 16.1*  INR 1.2    Studies/Results: DG Abd Portable 1V Result Date: 07/21/2024 CLINICAL DATA:  Enteric catheter placement EXAM: PORTABLE ABDOMEN - 1 VIEW COMPARISON:  07/21/2024 FINDINGS: Frontal view of the lower chest and upper abdomen demonstrates enteric catheter tip and side port project over the gastric fundus. Endoluminal abdominal aortic stent graft is again noted, with vascular stents in the celiac, SMA, and bilateral renal arteries. Surgical clips  right upper quadrant. Mild gaseous distention of the small bowel within the right upper quadrant, without evidence of obstruction. IMPRESSION: 1. Enteric catheter tip projecting over the gastric fundus. Electronically Signed   By: Ozell Daring M.D.   On: 07/21/2024 18:32   DG CHEST PORT 1 VIEW Result Date: 07/21/2024 CLINICAL DATA:  Intubated EXAM: PORTABLE CHEST 1 VIEW COMPARISON:  07/11/2024 FINDINGS: Two frontal views of the chest demonstrate endotracheal tube overlying tracheal air column, tip approximately 4 cm above carina. Enteric catheter passes below diaphragm, tip and side port projecting over the gastric fundus. Stable enlarged cardiac silhouette and ectasia of the thoracic aorta. Worsening bilateral interstitial and ground-glass opacities, consistent with edema or infection superimposed upon chronic emphysema. Stable right pleural thickening. No pneumothorax. IMPRESSION: 1. Support devices as above. 2. Increased interstitial and ground-glass opacities, consistent with worsening edema or infection superimposed upon chronic emphysema. Electronically Signed   By: Ozell Daring M.D.   On: 07/21/2024 18:30   CT Angio Abd/Pel w/ and/or w/o Result Date: 07/21/2024 CLINICAL DATA:  Lower gastrointestinal bleeding. EXAM: CTA ABDOMEN AND PELVIS WITHOUT AND WITH CONTRAST TECHNIQUE: Multidetector CT imaging of the abdomen and pelvis was performed using the standard protocol during bolus administration of intravenous contrast. Multiplanar reconstructed images and MIPs were obtained and reviewed to evaluate the vascular anatomy. RADIATION DOSE REDUCTION: This exam was performed according  to the departmental dose-optimization program which includes automated exposure control, adjustment of the mA and/or kV according to patient size and/or use of iterative reconstruction technique. CONTRAST:  75mL OMNIPAQUE  IOHEXOL  350 MG/ML SOLN COMPARISON:  June 14, 2024 FINDINGS: VASCULAR Aorta: Status post stent graft  repair of abdominal aorta. Graft and limbs are widely patent. Excluded aneurysmal sac measures 6.2 cm in diameter. Celiac: Patent stent is noted in origin of celiac artery. SMA: Patent stent is noted in origin of superior mesenteric artery. Renals: Patent stents are noted in the origins of both renal arteries. IMA: Occluded at origin presumably due to stent graft. Reconstitution of more distal portions is noted through collateral flow. Inflow: Patent without evidence of aneurysm, dissection, vasculitis or significant stenosis. Proximal Outflow: Bilateral common femoral and visualized portions of the superficial and profunda femoral arteries are patent without evidence of aneurysm, dissection, vasculitis or significant stenosis. Veins: No obvious venous abnormality within the limitations of this arterial phase study. Review of the MIP images confirms the above findings. NON-VASCULAR Lower chest: Small bilateral pleural effusions are noted with minimal adjacent subsegmental atelectasis. Hepatobiliary: Cholelithiasis. No biliary dilatation. No definite hepatic abnormality. Pancreas: 5 mm pancreatic duct stone is noted in pancreatic head. No acute inflammation is noted. No definite ductal dilatation. Pancreatic head calcifications are noted suggesting chronic pancreatitis. Spleen: Normal in size without focal abnormality. Adrenals/Urinary Tract: Adrenal glands appear normal. Bilateral renal cysts are noted. Nonobstructive right nephrolithiasis. No hydronephrosis or renal obstruction is noted. Suprapubic catheter is noted in urinary bladder. Stomach/Bowel: The stomach is unremarkable. No definite evidence of bowel obstruction or inflammation. No definite evidence of contrast extravasation to suggest gastrointestinal bleeding. Lymphatic: No definite adenopathy is noted. Reproductive: Mild prostatic enlargement is noted. Other: No abdominal wall hernia or abnormality. No abdominopelvic ascites. Musculoskeletal: No acute or  significant osseous findings. IMPRESSION: 1. Status post stent graft repair of abdominal aortic aneurysm. Graft and limbs are widely patent. Excluded aneurysmal sac measures 6.2 cm in diameter. 2. Patent stents are noted in the origins of the celiac, superior mesenteric and bilateral renal arteries. 3. 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 4. Small bilateral pleural effusions are noted with minimal adjacent subsegmental atelectasis. 5. Cholelithiasis. 6. 5 mm pancreatic duct stone is noted in pancreatic head. No definite ductal dilatation is noted. Pancreatic head calcifications are noted suggesting chronic pancreatitis. 7. Nonobstructive right nephrolithiasis. 8. Suprapubic catheter is noted in urinary bladder. 9. Mild prostatic enlargement. Electronically Signed   By: Lynwood Landy Raddle M.D.   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 Gastritis / erosions and biopsies taken to rule out H pylori. Colonoscopy done 1/31 - fair prep, multiple polyps removed. Capsule endoscopy then done and interpreted 2/2 - blood in the stomach and small bowel. Repeat EGD 2/3 - at each gastric biopsy site there was adherent clot, one with oozing, thought to be culprit for his bleeding. Clips placed. He has had significant rebleeding in the setting of thrombocytopenia. Given platelet transfusion and FFP and blood. Fibrinogen low, not sure if due to consumption of something else.  Given the stigmata of bleeding at multiple biopsy sites in his stomach, which is very  unusual, concern is that this bleeding is more reflective of an issue with his thrombocytopenia / coagulation ability. He could have rebled from any of the colon polypectomy sites, or  another small bowel source, or rebled from the gastric biopsy sites that were recently treated. OG placed yesterday and no blood returned arguing against recurrent gastric bleeding. CTA repeated with active bleeding and no active extravasation - was not able to localize his bleeding.  Repeat labs this AM and will monitor today. Thus far has stabilized overnight. Should he have rebleeding today I can do an endoscopy at the bedside to reassess his stomach and ensure no recurrent bleeding there. Unprepped colonoscopy would be very challenging at this time. Recommend hematology consultation for their opinion on his case given findings to date. Should he have significant rebleeding, consider repeat CTA. Given he is intubated in the ICU, tagged RBC scan may not be possible.  Keep NPO for now. Keep platelets > 50, trend Hgb and BUN. Continue protonix  40mg  BID.   Of note, no family has been in the hospital. He has a brother who lives in [place]  who is aware of change in his status and now in the ICU.     PLAN: - holding plavix  - transfuse to keep platelets > 50,  - hematology consultation - trend Hgb, BUN, coagulation studies - continue protonix  40mg  BID - contact me if recurrent bleeding, consider repeat EGD at bedside today if this happens or repeat CTA  Will follow closely, call with questions.  Marcey Naval, MD [facility]   "

## 2024-07-22 NOTE — Progress Notes (Signed)
 eLink Physician-Brief Progress Note Patient Name: [name] DOB: 1945-12-17 MRN: [id number]   Date of Service  07/22/2024  HPI/Events of Note  Patient with agitated delirium.  eICU Interventions  Zyprexa  2.5 mg IM x 1 PRN agitation.        Marcellina PENNER Exilda Wilhite 07/22/2024, 11:02 PM

## 2024-07-22 NOTE — Progress Notes (Incomplete)
 "  NAME:  [name], MRN:  [id number], DOB:  1946/05/28, LOS: 9 ADMISSION DATE:  07/11/2024, CONSULTATION DATE:  07/22/24 REFERRING MD:  Francesco, CHIEF COMPLAINT:  GIB   History of Present Illness:  [name] is a 79 y.o. M with PMH significant for COPD on 3L home O2, AAA s/p repair in 2025, CAD on plavix  and asa, Atrial fibrillation not on anticoagulation, CKD 3, stage 1B non-small cell lung cancer s/p RUL lobectomy in 2007, chronic bladder outlet obstruction with indwelling foley, Hx of ETOH abuse and Barrett's esophagus who presented initially on 1/25 with chest pain and dyspnea secondary to COPD exacerbation.  This has now improved, however started experiencing melena and underwent an  EGD on 1/28 and ampullary adenoma and gastritis biopsies completed.    He continued to have melanotic stools, so had another EGD on 2/3 which showed oozing from the biopsy sites, on 2/2 a capsule endoscopy showed red blood in the stomach and small bowel.  These were clipped, however bleeding persisted and between 2/3 and 2/4 pt was given 1 unit PRBC's, 4 platelets, 2 FFP.  A CTA bleeding scan was ordered and critical care consulted in the setting of persistent GIB.   Hgb dropped to 6.7, improved to 7.6 with the above.  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   Significant Hospital Events: Including procedures, antibiotic start and stop dates in addition to other pertinent events   1/25 admit to IMTS  2/4 persistent GIB after EGD and bleeding sites were clipped, CTA bleeding scan and ICU consult  Interim History / Subjective:    Objective    Blood pressure (!) 124/56, pulse 96, temperature 98.8 F (37.1 C), temperature source Axillary, resp. rate 14, height 6' (1.829 m), weight 68.1 kg, SpO2 94%.    Vent Mode: PRVC FiO2 (%):  [40 %-100 %] 40 % Set Rate:  [18 bmp] 18 bmp Vt Set:  [620 mL] 620 mL PEEP:  [5 cmH20] 5 cmH20 Plateau Pressure:  [15 cmH20-18 cmH20] 15 cmH20   Intake/Output Summary (Last 24 hours) at 07/22/2024 1541 Last data filed at 07/22/2024 1500 Gross per 24 hour  Intake 3796.11 ml  Output 2700 ml  Net 1096.11 ml   Filed Weights   07/11/24 0522 07/12/24 1624 07/21/24 1606  Weight: 60 kg 61.3 kg 68.1 kg   General:  thin, ill appearing M resting in bed in NAD HEENT: MM pink/moist, sclera anicteric, PERRLA  Neuro: alert and oriented CV: s1s2 rrr, no m/r/g PULM:  clear bilaterally on [place] oxygen  GI: soft, non-tender to palpation  Extremities: warm/dry, no edema     Resolved problem list   Assessment and Plan  Dr. Johnston problem based presentation in this order  Neuro Acute Delirium  -Initially thought to be due to  hypoxia from volume overload with products  -Precedex  as needed  -Is a smoker and on percocet at home; fills percocet regularly  -could be withdrawing?    Respiratory Respiratory/Airway status/Gas/ SBT? / laryngeal edema  Cardiac Cardiac/Hemodynamic stability/ last TTE  GI GI  Nutrution PPI only for shock or mechanical ventilation  OR GI  issues  ID ID work up and lines and dates Lines:  Endo   Renal Metabolic/ AKI/Lytes/Lactic acid  Heme WBC/CBC/Platelets  Anticoagulation: Reason:  MSK/Other   ABLA secondary to GIB Chronic anemia and thrombocytopenia   -EGD on 1/28 with biopsies ; no stigmata of bleeding then -1/31 CSY with  polypectomy  -2/2 capsule endoscopy showed blood in stomach and small bowel -2/3 S/p EGDb which showed oozing from prior bx sites of EGD from 1/31; found adherent clot at each biopsy site with one oozing blood which was though to be the culprit of bleeding; clips were placed  -Bleeding continued and pt was transferred to ICU for further management  -CTA not able to localize bleeding  -Very unusual for bleeding from mx sites so suspect thrombocytopenia/coagulopathy; hematology consulted  -repeat CTA if significant rebleeding occurs or contact GI for repeat EGD -TEG shows some abnormalities; will transfuse 1 unit of FFP today  Plan:  -If re-bleeding occurs: repeat CTA or contact GI for EGD  -transfuse 1 unit of Ffp today  -f/u further coagulapathy work-up ordered by Hematology  -hold plavix  - transfuse to keep platelets > 50, - hematology consultation - trend Hgb, BUN, coagulation studies - continue protonix  40mg  BID   Intubation 2/2 respiratory failure 07/21/24 Chronic Hypoxic Respiratory Failure secondary to COPD on 3L home O2 Tobacco abuse History of stage I squamous cell lung cancer-surgically resected  -required intubation for respiratory distress  -became agitated and hypoxic and required intubation -repeat cxr shows worsening edema; findings similar to prior CXR which r/o TRALI    Plan: -Lasix  for pulmonary edema  -SBT and wean off vent when able  -will transition to precedex  from propofol  for sedation  -continue triple therapy daily   -continued tobacco cessation counseling when appropriate -follows with Dr. Zaida, on Stiolto Respimat     PAD Stable AAA Initial  concern for possible endoleak on CT  -seen by vascular, no need for intervention -avoid HTN, continue to monitor    Persistent Atrial Fibrillation  CAD Hx Trifascicular Block -rate controlled at home with metoprolol  -Start BB if HR increases  -not anti-coagulated secondary to the hx of GIB -follows with EP outpatient  -hold bb in the setting of GIB and possible developing shock    Labs   CBC: Recent Labs  Lab 07/21/24 0354 07/21/24 0821 07/21/24 1804 07/21/24 1849 07/21/24 2221 07/22/24 0920  WBC 8.8 9.7 10.6*  --  11.7* 9.2  NEUTROABS 6.4 7.8* 9.3*  --  9.6* 6.5  HGB 8.0* 7.6* 8.9* 7.8* 8.3* 8.1*  HCT 23.6* 23.3* 26.9* 23.0* 24.8* 24.4*  MCV 86.1 86.3 87.1  --  85.8 87.1  PLT 45* 67* 125*  --  105* 91*    Basic Metabolic Panel: Recent Labs  Lab 07/19/24 0603 07/20/24 0712 07/21/24 0821 07/21/24 1804 07/21/24 1849 07/22/24 0920  NA 139 138 138 137 139 138  K 5.0 4.9 4.7 4.4 4.2 4.6  CL 109 109 106 103  --  104  CO2 21* 24 25 23   --  23  GLUCOSE 80 94 130* 110*  --  79  BUN 41* 34* 33* 32*  --  31*  CREATININE 1.58* 1.39* 1.38* 1.28*  --  1.41*  CALCIUM  7.9* 8.2* 8.0* 8.5*  --  8.0*   GFR: Estimated Creatinine Clearance: 41.6 mL/min (A) (by C-G formula based on SCr of 1.41 mg/dL (H)). Recent Labs  Lab 07/18/24 1917 07/19/24 0506 07/21/24 0821 07/21/24 1804 07/21/24 1809 07/21/24 2221 07/22/24 0920  WBC 5.4   < > 9.7 10.6*  --  11.7* 9.2  LATICACIDVEN 1.7  --   --   --  2.2* 1.8  --    < > = values in this interval not displayed.    Liver Function Tests: Recent Labs  Lab 07/21/24 1804  AST 82*  ALT 81*  ALKPHOS 62  BILITOT 0.9  PROT 5.7*  ALBUMIN  3.5   No results for input(s): LIPASE, AMYLASE in the last 168 hours. Recent Labs  Lab 07/21/24 1804  AMMONIA 42*    ABG    Component Value Date/Time   PHART 7.389 07/21/2024 1849   PCO2ART 39.3 07/21/2024 1849   PO2ART 335 (H) 07/21/2024 1849   HCO3 23.8 07/21/2024 1849   TCO2 25  07/21/2024 1849   ACIDBASEDEF 1.0 07/21/2024 1849   O2SAT 100 07/21/2024 1849     Coagulation Profile: Recent Labs  Lab 07/18/24 2105 07/21/24 0821 07/22/24 0920  INR 1.3* 1.2 1.2    Cardiac Enzymes: No results for input(s): CKTOTAL, CKMB, CKMBINDEX, TROPONINI in the last 168 hours.  HbA1C: Hemoglobin A1C  Date/Time Value Ref Range Status  09/23/2022 10:32 AM 5.9 (A) 4.0 - 5.6 % Final   Hgb A1c MFr Bld  Date/Time Value Ref Range Status  07/21/2024 06:02 PM 5.4 4.8 - 5.6 % Final    Comment:    (NOTE) Diagnosis of Diabetes The following HbA1c ranges recommended by the American Diabetes Association (ADA) may be used as an aid in the diagnosis of diabetes mellitus.  Hemoglobin             Suggested A1C NGSP%              Diagnosis  <5.7                   Non Diabetic  5.7-6.4                Pre-Diabetic  >6.4                   Diabetic  <7.0                   Glycemic control for                       adults with diabetes.    04/27/2021 08:48 AM 5.5 4.8 - 5.6 % Final    Comment:    (NOTE) Pre diabetes:          5.7%-6.4%  Diabetes:              >6.4%  Glycemic control for   <7.0% adults with diabetes     CBG: Recent Labs  Lab 07/22/24 0104 07/22/24 0437 07/22/24 0844 07/22/24 1147 07/22/24 1506  GLUCAP 103* 78 72 105* 125*    Review of Systems:   Please see the history of present illness. 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   Surgical History:   Past Surgical History:  Procedure Laterality Date   ABDOMINAL AORTIC ENDOVASCULAR FENESTRATED STENT GRAFT N/A 08/16/2022   Procedure: ABDOMINAL AORTIC ENDOVASCULAR FENESTRATED STENT GRAFT;  Surgeon: [name] ORN, MD;  Location: [facility] OR;  Service: Vascular;  Laterality: N/A;   ABDOMINAL AORTIC ENDOVASCULAR FENESTRATED STENT GRAFT N/A 12/02/2023   Procedure: ABDOMINAL AORTIC ENDOVASCULAR FENESTRATED STENT GRAFT WITH ULTRASOUND GUIDED ACCESS;  Surgeon: [name] ORN, MD;  Location: [facility] INVASIVE CV LAB;  Service: Cardiovascular;  Laterality: N/A;   BIOPSY  11/12/2020   Procedure: BIOPSY;  Surgeon: Legrand Victory LITTIE DOUGLAS, MD;  Location: [facility] ENDOSCOPY;  Service: Gastroenterology;;   BIOPSY  07/25/2021   Procedure: BIOPSY;  Surgeon: Stacia Glendia BRAVO, MD;  Location: [facility] ENDOSCOPY;  Service: Gastroenterology;;   BIOPSY  12/24/2021   Procedure: BIOPSY;  Surgeon: Wilhelmenia Aloha Raddle., MD;  Location: [facility] ENDOSCOPY;  Service: Gastroenterology;;   BIOPSY OF SKIN SUBCUTANEOUS TISSUE AND/OR MUCOUS MEMBRANE  07/14/2024   Procedure: BIOPSY, SKIN, SUBCUTANEOUS TISSUE, OR MUCOUS MEMBRANE;  Surgeon: Wilhelmenia Aloha Raddle., MD;  Location: [facility] ENDOSCOPY;  Service: Gastroenterology;;   BRONCHIAL BIOPSY  07/27/2021   Procedure: BRONCHIAL BIOPSIES;  Surgeon: Shelah Lamar RAMAN, MD;  Location: [facility] ENDOSCOPY;  Service: Cardiopulmonary;;   BRONCHIAL BRUSHINGS  07/27/2021   Procedure: BRONCHIAL BRUSHINGS;  Surgeon: Shelah Lamar RAMAN, MD;  Location: [facility] ENDOSCOPY;  Service: Cardiopulmonary;;   cataract surgery Bilateral 2018   removal of cataracts   COLONOSCOPY     hx polyps - [place] 02/2012   COLONOSCOPY N/A 07/17/2024   Procedure:  COLONOSCOPY;  Surgeon: Wilhelmenia Aloha Raddle., MD;  Location: [facility] ENDOSCOPY;  Service: Gastroenterology;  Laterality: N/A;   COLONOSCOPY WITH PROPOFOL  N/A 11/12/2020   Procedure: COLONOSCOPY WITH PROPOFOL ;  Surgeon: Legrand Victory LITTIE DOUGLAS, MD;  Location: [facility] ENDOSCOPY;  Service: Gastroenterology;  Laterality: N/A;   CORONARY ANGIOPLASTY WITH STENT PLACEMENT     ENDOVASCULAR STENT INSERTION Bilateral 08/16/2022   Procedure: ENDOVASCULAR STENT GRAFT INSERTION OF BILATERAL RENAL ARTERIES;  Surgeon: [name] ORN, MD;  Location: [facility] OR;  Service: Vascular;  Laterality: Bilateral;   ESOPHAGOGASTRODUODENOSCOPY N/A 07/14/2024   Procedure: EGD (ESOPHAGOGASTRODUODENOSCOPY);  Surgeon: Wilhelmenia Aloha Raddle., MD;  Location: [facility] ENDOSCOPY;  Service: Gastroenterology;  Laterality: N/A;   ESOPHAGOGASTRODUODENOSCOPY N/A 07/20/2024   Procedure: EGD (ESOPHAGOGASTRODUODENOSCOPY);  Surgeon: Leigh Elspeth SQUIBB, MD;  Location: [facility] ENDOSCOPY;  Service: Gastroenterology;  Laterality: N/A;   ESOPHAGOGASTRODUODENOSCOPY (EGD) WITH PROPOFOL  N/A 11/12/2020   Procedure: ESOPHAGOGASTRODUODENOSCOPY (EGD) WITH PROPOFOL ;  Surgeon: Legrand Victory LITTIE DOUGLAS, MD;  Location: [facility] ENDOSCOPY;  Service: Gastroenterology;  Laterality: N/A;   ESOPHAGOGASTRODUODENOSCOPY (EGD) WITH PROPOFOL  N/A 03/06/2021   Procedure: ESOPHAGOGASTRODUODENOSCOPY (EGD) WITH PROPOFOL ;  Surgeon: Leigh Elspeth SQUIBB, MD;  Location: [facility] ENDOSCOPY;  Service: Gastroenterology;  Laterality: N/A;   ESOPHAGOGASTRODUODENOSCOPY (EGD) WITH PROPOFOL  N/A 07/25/2021   Procedure: ESOPHAGOGASTRODUODENOSCOPY (EGD) WITH PROPOFOL ;  Surgeon: Stacia Glendia BRAVO, MD;  Location: [facility] ENDOSCOPY;  Service: Gastroenterology;  Laterality: N/A;   ESOPHAGOGASTRODUODENOSCOPY (EGD) WITH PROPOFOL  N/A 12/24/2021   Procedure: ESOPHAGOGASTRODUODENOSCOPY (EGD) WITH PROPOFOL ;  Surgeon: Wilhelmenia Aloha Raddle.,  MD;  Location: [facility] ENDOSCOPY;  Service: Gastroenterology;  Laterality: N/A;   EUS N/A 07/14/2024   Procedure:  ULTRASOUND, UPPER GI TRACT, ENDOSCOPIC;  Surgeon: Wilhelmenia Aloha Raddle., MD;  Location: [facility] ENDOSCOPY;  Service: Gastroenterology;  Laterality: N/A;   flexible cystourethroscopy  05/24/06   GIVENS CAPSULE STUDY N/A 07/18/2024   Procedure: IMAGING PROCEDURE, GI TRACT, INTRALUMINAL, VIA CAPSULE;  Surgeon: Mansouraty, Aloha Raddle., MD;  Location: [facility] ENDOSCOPY;  Service: Gastroenterology;  Laterality: N/A;   HEMOSTASIS CLIP PLACEMENT  03/06/2021   Procedure: HEMOSTASIS CLIP PLACEMENT;  Surgeon: Leigh Elspeth SQUIBB, MD;  Location: [facility] ENDOSCOPY;  Service: Gastroenterology;;   HEMOSTASIS CLIP PLACEMENT  12/24/2021   Procedure: HEMOSTASIS CLIP PLACEMENT;  Surgeon: Wilhelmenia Aloha Raddle., MD;  Location: [facility] ENDOSCOPY;  Service: Gastroenterology;;   HEMOSTASIS CLIP PLACEMENT  07/20/2024   Procedure: CONTROL OF HEMORRHAGE, GI TRACT, ENDOSCOPIC, BY CLIPPING OR OVERSEWING;  Surgeon: Leigh Elspeth SQUIBB, MD;  Location: [facility] ENDOSCOPY;  Service: Gastroenterology;;   HOT HEMOSTASIS N/A 03/06/2021   Procedure: HOT HEMOSTASIS (ARGON PLASMA COAGULATION/BICAP);  Surgeon: Leigh Elspeth SQUIBB, MD;  Location: [facility] ENDOSCOPY;  Service: Gastroenterology;  Laterality: N/A;   INGUINAL HERNIA REPAIR  2005   INSERTION OF ILIAC STENT Bilateral 08/16/2022   Procedure: INSERTION OF ILIAC STENT RIGHT EXTERNAL ARTERY, LEFT COMMON ARTERY, AND RIGHT COMMON ARTERY;  Surgeon: [name] ORN, MD;  Location: [facility] OR;  Service: Vascular;  Laterality: Bilateral;   IR CATHETER TUBE CHANGE  07/08/2022   LUNG LOBECTOMY Right 11/07   RUL   MULTIPLE TOOTH EXTRACTIONS     POLYPECTOMY  11/12/2020   Procedure: POLYPECTOMY;  Surgeon: Legrand Victory LITTIE DOUGLAS, MD;  Location: [facility] ENDOSCOPY;  Service: Gastroenterology;;   POLYPECTOMY  12/24/2021   Procedure: POLYPECTOMY;  Surgeon: Wilhelmenia Aloha Raddle., MD;  Location: [facility] ENDOSCOPY;  Service: Gastroenterology;;   POLYPECTOMY  07/17/2024   Procedure: POLYPECTOMY, INTESTINE;  Surgeon: Wilhelmenia Aloha Raddle., MD;   Location: [facility] ENDOSCOPY;  Service: Gastroenterology;;   SUBMUCOSAL LIFTING INJECTION  12/24/2021   Procedure: SUBMUCOSAL LIFTING INJECTION;  Surgeon: Wilhelmenia Aloha Raddle., MD;  Location: [facility] ENDOSCOPY;  Service: Gastroenterology;;   TRANSURETHRAL RESECTION OF PROSTATE N/A 05/02/2021   Procedure: TRANSURETHRAL RESECTION OF THE PROSTATE (TURP);  Surgeon: Carolee Sherwood JONETTA DOUGLAS, MD;  Location: [facility] ORS;  Service: Urology;  Laterality: N/A;   ULTRASOUND GUIDANCE FOR VASCULAR ACCESS Bilateral 08/16/2022   Procedure: ULTRASOUND GUIDANCE FOR VASCULAR ACCESS;  Surgeon: [name] ORN, MD;  Location: [facility] OR;  Service: Vascular;  Laterality: Bilateral;   UPPER ESOPHAGEAL ENDOSCOPIC ULTRASOUND (EUS) N/A 12/24/2021   Procedure: UPPER ESOPHAGEAL ENDOSCOPIC ULTRASOUND (EUS);  Surgeon: Wilhelmenia Aloha Raddle., MD;  Location: [facility] ENDOSCOPY;  Service: Gastroenterology;  Laterality: N/A;   UPPER GASTROINTESTINAL ENDOSCOPY     VIDEO BRONCHOSCOPY  07/27/2021   Procedure: VIDEO BRONCHOSCOPY WITHOUT FLUORO;  Surgeon: Shelah Lamar RAMAN, MD;  Location: [facility] ENDOSCOPY;  Service: Cardiopulmonary;;   VIDEO BRONCHOSCOPY Right 05/14/2024   Procedure: VIDEO BRONCHOSCOPY WITHOUT FLUORO;  Surgeon: Jude Harden GAILS, MD;  Location: [facility] ENDOSCOPY;  Service: Cardiopulmonary;  Laterality: Right;     Social History:   reports that he has been smoking cigarettes. He has a 41.3 pack-year smoking history. He has never used smokeless tobacco. He reports that he does not currently use alcohol. He reports that he does not use drugs.   Family History:  His family history includes Arthritis in his brother; Diabetes in his mother and sister; Gout in his brother; Heart disease in his sister; Hypertension in his  sister. There is no history of Colon cancer, Esophageal cancer, Rectal cancer, Stomach cancer, Colon polyps, Inflammatory bowel disease, Liver disease, or Pancreatic cancer.   Allergies Allergies[1]   Home Medications  Prior to Admission  medications  Medication Sig Start Date End Date Taking? Authorizing Provider  albuterol  (VENTOLIN  HFA) 108 (90 Base) MCG/ACT inhaler Inhale 1-2 puffs into the lungs every 6 (six) hours as needed for wheezing or shortness of breath. 12/23/23  Yes [name], DO  amLODipine  (NORVASC ) 10 MG tablet Take 1 tablet (10 mg total) by mouth daily. 12/09/23 12/08/24 Yes [name], DO  aspirin  EC 81 MG tablet Take 1 tablet (81 mg total) by mouth daily. Swallow whole. 04/14/23  Yes [name] ORN, MD  clopidogrel  (PLAVIX ) 75 MG tablet Take 1 tablet (75 mg total) by mouth daily. 12/11/23  Yes Bethanie Cough, PA-C  escitalopram  (LEXAPRO ) 5 MG tablet Take 1 tablet (5 mg total) by mouth daily. 07/08/24 01/04/25 Yes [name], DO  HYDROcodone -acetaminophen  (NORCO) 7.5-325 MG tablet Take 1 tablet by mouth 2 (two) times daily as needed for moderate pain (pain score 4-6). Fill 30 days after last fill. 06/24/24  Yes [name], DO  metoprolol  succinate (TOPROL -XL) 25 MG 24 hr tablet Take 1 tablet (25 mg total) by mouth daily. 06/24/24 06/24/25 Yes [name], DO  mirtazapine  (REMERON ) 7.5 MG tablet Take 2 tablets (15 mg total) by mouth at bedtime. 03/24/24  Yes [name], DO  Pancrelipase , Lip-Prot-Amyl, (ZENPEP ) 20000-63000 units CPEP Take 3 capsules by mouth 3 (three) times daily before meals. 07/05/24  Yes [name], DO  pantoprazole  (PROTONIX ) 40 MG tablet TAKE 1 TABLET EVERY DAY FOR REFLUX 12/09/23  Yes [name], DO  predniSONE  (DELTASONE ) 50 MG tablet Take one tablet 13 hours, 7 hours, and 1 hour prior to scan. 06/23/24  Yes [name] ORN, MD  rosuvastatin  (CRESTOR ) 40 MG tablet Take 1 tablet (40 mg total) by mouth daily. 04/03/23  Yes Jerrell Cleatus Ned, MD  sacubitril -valsartan  (ENTRESTO ) 97-103 MG Take 1 tablet by mouth 2 (two) times daily. 04/19/24  Yes [name], DO  spironolactone  (ALDACTONE ) 25 MG tablet Take 0.5 tablets (12.5 mg total) by mouth daily. 06/07/24  09/05/24 Yes [name], Callie E, PA-C  Tiotropium Bromide-Olodaterol (STIOLTO RESPIMAT ) 2.5-2.5 MCG/ACT AERS Inhale 2 puffs into the lungs daily. 02/27/24  Yes Hattar, Zola SAILOR, MD  diphenhydrAMINE  (BENADRYL ) 50 MG capsule Take one capsule 1 hour prior to scan. Patient not taking: Reported on 07/11/2024 06/23/24   [name] ORN, MD  furosemide  (LASIX ) 40 MG tablet Take 1 tablet (40 mg total) by mouth daily. Patient not taking: Reported on 07/11/2024 05/29/24   Harrie Bruckner, DO  OXYGEN Inhale 3 L/min into the lungs continuous.    [provider]     Critical care time: 60 minutes     CRITICAL CARE Performed by: Rebecka Pion   Total critical care time: 60 minutes  Critical care time was exclusive of separately billable procedures and treating other patients.  Critical care was necessary to treat or prevent imminent or life-threatening deterioration.  Critical care was time spent personally by me on the following activities: development of treatment plan with patient and/or surrogate as well as nursing, discussions with consultants, evaluation of patient's response to treatment, examination of patient, obtaining history from patient or surrogate, ordering and performing treatments and interventions, ordering and review of laboratory studies, ordering and review of radiographic studies, pulse oximetry  and re-evaluation of patient's condition.   Leita SAUNDERS Gleason, PA-C [facility] 7a-7p: For contact information, see AMION. If no response to pager, please call PCCM 2-H APP. After 7p: Please call PCCM APP on-call for 2-H.            [1]  Allergies Allergen Reactions   Candesartan Cilexetil-Hctz Other (See Comments)    Acute renal failure   Hydrochlorothiazide Other (See Comments)    Acute renal failure   Shellfish Allergy Other (See Comments)    Causes GOUT   Betadine [Povidone Iodine ] Itching   "

## 2024-07-22 NOTE — Progress Notes (Signed)
 Brief Nutrition Follow-up:  Pt transferred to ICU, currently sedated on vent support.   + continued GI bleeding  NPO. LOS 11 days. Pt has been NPO/CL for majority of this admission. +malnutrition   Discussed nutrition poc with Dr. Alva; recommend considering TPN initiation at this time. MD hoping that we can utilize enteral route within 24 hours, if pt without bleeding, and does not want TPN at this time  If unable to utilize enteral route for nutrition by tomorrow 2/06, recommend initiation of TPN  Plan to start IV Thiamine  today x 7 days as pt is at high refeeding risk, started on D10 infusion today. Monitor electrolytes closely Phosphorus not check this admission; recommend add-on for today, repeat in AM Magnesium  not checked this admission, recommend checking in AM Potassium wdl, checking with BMP  Betsey Finger MS, RDN, LDN, CNSC Registered Dietitian 3 Clinical Nutrition RD Inpatient Contact Info in Amion

## 2024-07-22 NOTE — Consult Note (Cosign Needed Addendum)
 "  [facility]  [phone] [phone]    HEMATOLOGY CONSULTATION  PURPOSE OF CONSULTATION/CHIEF COMPLAINT: Thrombocytopenia/GI Bleeding   Referring MD:  Dr. Jude   HPI: [name]. [name] is a 79 year old male patient who presented on 07/11/2024 with complaints of chest pain and dyspnea.  Patient also complained of dizziness.  Apparently patient on home O2 but did not have portable canister. Patient seen in ICU laying in bed, not in any apparent distress.  He is not alert or oriented and unable to provide converse.  O2 via nasal cannula is intact.  Attempted to wake patient up with verbal and tactile stimuli without success. Discussed with nurse that patient extubated in the last few hours.  History is obtained from documentation in the chart. Oncologic history includes squamous cell lung cancer diagnosed in 2007 and status post surgery only, non-small cell lung cancer stage I in 2017.  Medical history also includes AAA, CAD, hypertension and hyperlipidemia. Surgical history is extensive and includes surgery for lung cancer in 2007. Family hematologic and oncologic history is noncontributory. Social history significant for tobacco use 1 pack every 1 to 2 days, started smoking at age 60.  Denied alcohol use also noted alcohol abuse in his chart.  Denied recreational drug use.  Lives alone in [place].  Formerly worked as printmaker.  ASSESSMENT AND PLAN:  Thrombocytopenia - Platelets 105K. - Platelets have been in a wide range from 40s to low 100s during this admission. - Status post multiple platelet pheresis, FFP and cryo. - Recommend platelet transfusion for counts <20 K or <50 K with active bleeding - Smear review done 07/12/2024 showed unremarkable platelet morphology, macrocytosis, leukocytes unremarkable. - Continue to monitor CBC with differential - Hematology/Dr. Federico will make further recommendations.  GI bleeding/Melena Anemia Iron  deficiency anemia - Anemia likely due to acute GI bleeding, renal dysfunction - Hemoglobin 8.3.  Status post multiple PRBC transfusions this admission. - Recommend PRBC transfusion for hemoglobin <7.0 - Iron studies done 1/26 shows low sat 17%, ferritin 155. - Ampullary biopsy 1/28 negative for invasive carcinoma. -- GI team following. Concern for bleeding due to thrombocytopenia or coagulopathy.  - continue to monitor CBC with differential  AKI - Elevated BUN and creatinine - Avoid nephrotoxic agents - Continue to monitor renal function  History of NSCLC, stage I - Diagnosed in 2017 - Surgically treated without chemotherapy or radiation therapy  Acute hypoxic respiratory failure - Patient admitted with acute hypoxic respiratory failure and shortness of breath.  He was intubated and extubated today - Being followed closely by critical care team.   Past Medical History:  Diagnosis Date   AAA (abdominal aortic aneurysm)    followed by vasc surgery   Alcohol abuse    Barretts esophagus    bx distal esophagus 2011 neg    Bladder outlet obstruction 04/02/2018   CAD in native artery    a. s/p stent to prox RCA and mid Cx.   Cataract    Cholelithiasis    Chronic osteomyelitis involving ankle and foot, right (HCC) 07/13/2018   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   2006 - 3 adenomas largest 12 mm  2008 - no polyps 2013-  2 mm cecal polyp, adenoma, routine repeat colonoscopy 02/2017 approx 05/14/2017 18 polyps max 12 mm ALL adenomas recall 2019 [name] [name], MD, [facility]      Dieulafoy lesion of duodenum    Diverticulosis of colon    DJD (degenerative joint  disease)    left hip   Full dentures    upper and lower   GERD (gastroesophageal reflux disease)    diet controlled    Gout    feet   Hiatal hernia    2 cm noted on upper endos 2011    Hx of adenomatous colonic polyps    Hyperlipidemia    Hypertension    Hypoxia 05/31/2024   Internal hemorrhoid    Lung cancer (HCC)     squamous cell ([id number]), 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 pt was on Plavix  x 3 years then transitioned to ASA, no problems since   Non-small cell carcinoma of right lung, stage 1 (HCC) 01/30/2016   Renal failure, acute    hx of 2/2 medication, resolved   Tobacco abuse    smoker .5 ppd x 55 yrs   UTI (lower urinary tract infection)   :  Past Surgical History:  Procedure Laterality Date   ABDOMINAL AORTIC ENDOVASCULAR FENESTRATED STENT GRAFT N/A 08/16/2022   Procedure: ABDOMINAL AORTIC ENDOVASCULAR FENESTRATED STENT GRAFT;  Surgeon: [name] ORN, MD;  Location: [facility] OR;  Service: Vascular;  Laterality: N/A;   ABDOMINAL AORTIC ENDOVASCULAR FENESTRATED STENT GRAFT N/A 12/02/2023   Procedure: ABDOMINAL AORTIC ENDOVASCULAR FENESTRATED STENT GRAFT WITH ULTRASOUND GUIDED ACCESS;  Surgeon: [name] ORN, MD;  Location: [facility] INVASIVE CV LAB;  Service: Cardiovascular;  Laterality: N/A;   BIOPSY  11/12/2020   Procedure: BIOPSY;  Surgeon: Legrand Victory LITTIE DOUGLAS, MD;  Location: [facility] ENDOSCOPY;  Service: Gastroenterology;;   BIOPSY  07/25/2021   Procedure: BIOPSY;  Surgeon: Stacia Glendia BRAVO, MD;  Location: [facility] ENDOSCOPY;  Service: Gastroenterology;;   BIOPSY  12/24/2021   Procedure: BIOPSY;  Surgeon: Wilhelmenia Aloha Raddle., MD;  Location: [facility] ENDOSCOPY;  Service: Gastroenterology;;   BIOPSY OF SKIN SUBCUTANEOUS TISSUE AND/OR MUCOUS MEMBRANE  07/14/2024   Procedure: BIOPSY, SKIN, SUBCUTANEOUS TISSUE, OR MUCOUS MEMBRANE;  Surgeon: Wilhelmenia Aloha Raddle., MD;  Location: [facility] ENDOSCOPY;  Service: Gastroenterology;;   BRONCHIAL BIOPSY  07/27/2021   Procedure: BRONCHIAL BIOPSIES;  Surgeon: Shelah Lamar RAMAN, MD;  Location: [facility] ENDOSCOPY;  Service: Cardiopulmonary;;   BRONCHIAL BRUSHINGS  07/27/2021   Procedure: BRONCHIAL BRUSHINGS;  Surgeon: Shelah Lamar RAMAN, MD;  Location: [facility] ENDOSCOPY;  Service: Cardiopulmonary;;   cataract surgery Bilateral 2018    removal of cataracts   COLONOSCOPY     hx polyps - [name] 02/2012   COLONOSCOPY N/A 07/17/2024   Procedure: COLONOSCOPY;  Surgeon: Wilhelmenia Aloha Raddle., MD;  Location: [facility] ENDOSCOPY;  Service: Gastroenterology;  Laterality: N/A;   COLONOSCOPY WITH PROPOFOL  N/A 11/12/2020   Procedure: COLONOSCOPY WITH PROPOFOL ;  Surgeon: Legrand Victory LITTIE DOUGLAS, MD;  Location: [facility] ENDOSCOPY;  Service: Gastroenterology;  Laterality: N/A;   CORONARY ANGIOPLASTY WITH STENT PLACEMENT     ENDOVASCULAR STENT INSERTION Bilateral 08/16/2022   Procedure: ENDOVASCULAR STENT GRAFT INSERTION OF BILATERAL RENAL ARTERIES;  Surgeon: [name] ORN, MD;  Location: [facility] OR;  Service: Vascular;  Laterality: Bilateral;   ESOPHAGOGASTRODUODENOSCOPY N/A 07/14/2024   Procedure: EGD (ESOPHAGOGASTRODUODENOSCOPY);  Surgeon: Wilhelmenia Aloha Raddle., MD;  Location: [facility] ENDOSCOPY;  Service: Gastroenterology;  Laterality: N/A;   ESOPHAGOGASTRODUODENOSCOPY N/A 07/20/2024   Procedure: EGD (ESOPHAGOGASTRODUODENOSCOPY);  Surgeon: Leigh Elspeth SQUIBB, MD;  Location: [facility] ENDOSCOPY;  Service: Gastroenterology;  Laterality: N/A;   ESOPHAGOGASTRODUODENOSCOPY (EGD) WITH PROPOFOL  N/A 11/12/2020   Procedure: ESOPHAGOGASTRODUODENOSCOPY (EGD) WITH PROPOFOL ;  Surgeon: Legrand Victory LITTIE DOUGLAS, MD;  Location: [facility]  ENDOSCOPY;  Service: Gastroenterology;  Laterality: N/A;   ESOPHAGOGASTRODUODENOSCOPY (EGD) WITH PROPOFOL  N/A 03/06/2021   Procedure: ESOPHAGOGASTRODUODENOSCOPY (EGD) WITH PROPOFOL ;  Surgeon: Leigh Elspeth SQUIBB, MD;  Location: [facility] ENDOSCOPY;  Service: Gastroenterology;  Laterality: N/A;   ESOPHAGOGASTRODUODENOSCOPY (EGD) WITH PROPOFOL  N/A 07/25/2021   Procedure: ESOPHAGOGASTRODUODENOSCOPY (EGD) WITH PROPOFOL ;  Surgeon: Stacia Glendia BRAVO, MD;  Location: [facility] ENDOSCOPY;  Service: Gastroenterology;  Laterality: N/A;   ESOPHAGOGASTRODUODENOSCOPY (EGD) WITH PROPOFOL  N/A 12/24/2021   Procedure: ESOPHAGOGASTRODUODENOSCOPY (EGD) WITH PROPOFOL ;  Surgeon: Wilhelmenia Aloha Raddle.,  MD;  Location: [facility] ENDOSCOPY;  Service: Gastroenterology;  Laterality: N/A;   EUS N/A 07/14/2024   Procedure: ULTRASOUND, UPPER GI TRACT, ENDOSCOPIC;  Surgeon: Wilhelmenia Aloha Raddle., MD;  Location: [facility] ENDOSCOPY;  Service: Gastroenterology;  Laterality: N/A;   flexible cystourethroscopy  05/24/06   GIVENS CAPSULE STUDY N/A 07/18/2024   Procedure: IMAGING PROCEDURE, GI TRACT, INTRALUMINAL, VIA CAPSULE;  Surgeon: Mansouraty, Aloha Raddle., MD;  Location: [facility] ENDOSCOPY;  Service: Gastroenterology;  Laterality: N/A;   HEMOSTASIS CLIP PLACEMENT  03/06/2021   Procedure: HEMOSTASIS CLIP PLACEMENT;  Surgeon: Leigh Elspeth SQUIBB, MD;  Location: [facility] ENDOSCOPY;  Service: Gastroenterology;;   HEMOSTASIS CLIP PLACEMENT  12/24/2021   Procedure: HEMOSTASIS CLIP PLACEMENT;  Surgeon: Wilhelmenia Aloha Raddle., MD;  Location: [facility] ENDOSCOPY;  Service: Gastroenterology;;   HEMOSTASIS CLIP PLACEMENT  07/20/2024   Procedure: CONTROL OF HEMORRHAGE, GI TRACT, ENDOSCOPIC, BY CLIPPING OR OVERSEWING;  Surgeon: Leigh Elspeth SQUIBB, MD;  Location: [facility] ENDOSCOPY;  Service: Gastroenterology;;   HOT HEMOSTASIS N/A 03/06/2021   Procedure: HOT HEMOSTASIS (ARGON PLASMA COAGULATION/BICAP);  Surgeon: Leigh Elspeth SQUIBB, MD;  Location: [facility] ENDOSCOPY;  Service: Gastroenterology;  Laterality: N/A;   INGUINAL HERNIA REPAIR  2005   INSERTION OF ILIAC STENT Bilateral 08/16/2022   Procedure: INSERTION OF ILIAC STENT RIGHT EXTERNAL ARTERY, LEFT COMMON ARTERY, AND RIGHT COMMON ARTERY;  Surgeon: [name] ORN, MD;  Location: [facility] OR;  Service: Vascular;  Laterality: Bilateral;   IR CATHETER TUBE CHANGE  07/08/2022   LUNG LOBECTOMY Right 11/07   RUL   MULTIPLE TOOTH EXTRACTIONS     POLYPECTOMY  11/12/2020   Procedure: POLYPECTOMY;  Surgeon: Legrand Victory LITTIE DOUGLAS, MD;  Location: [facility] ENDOSCOPY;  Service: Gastroenterology;;   POLYPECTOMY  12/24/2021   Procedure: POLYPECTOMY;  Surgeon: Wilhelmenia Aloha Raddle., MD;  Location: [facility] ENDOSCOPY;  Service:  Gastroenterology;;   POLYPECTOMY  07/17/2024   Procedure: POLYPECTOMY, INTESTINE;  Surgeon: Wilhelmenia Aloha Raddle., MD;  Location: [facility] ENDOSCOPY;  Service: Gastroenterology;;   SUBMUCOSAL LIFTING INJECTION  12/24/2021   Procedure: SUBMUCOSAL LIFTING INJECTION;  Surgeon: Wilhelmenia Aloha Raddle., MD;  Location: [facility] ENDOSCOPY;  Service: Gastroenterology;;   TRANSURETHRAL RESECTION OF PROSTATE N/A 05/02/2021   Procedure: TRANSURETHRAL RESECTION OF THE PROSTATE (TURP);  Surgeon: Carolee Sherwood JONETTA DOUGLAS, MD;  Location: [facility] ORS;  Service: Urology;  Laterality: N/A;   ULTRASOUND GUIDANCE FOR VASCULAR ACCESS Bilateral 08/16/2022   Procedure: ULTRASOUND GUIDANCE FOR VASCULAR ACCESS;  Surgeon: [name] ORN, MD;  Location: [facility] OR;  Service: Vascular;  Laterality: Bilateral;   UPPER ESOPHAGEAL ENDOSCOPIC ULTRASOUND (EUS) N/A 12/24/2021   Procedure: UPPER ESOPHAGEAL ENDOSCOPIC ULTRASOUND (EUS);  Surgeon: Wilhelmenia Aloha Raddle., MD;  Location: [facility] ENDOSCOPY;  Service: Gastroenterology;  Laterality: N/A;   UPPER GASTROINTESTINAL ENDOSCOPY     VIDEO BRONCHOSCOPY  07/27/2021   Procedure: VIDEO BRONCHOSCOPY WITHOUT FLUORO;  Surgeon: Shelah Lamar RAMAN, MD;  Location: [facility] ENDOSCOPY;  Service: Cardiopulmonary;;   VIDEO BRONCHOSCOPY Right 05/14/2024   Procedure: VIDEO BRONCHOSCOPY WITHOUT FLUORO;  Surgeon: Jude,  Harden GAILS, MD;  Location: [facility] ENDOSCOPY;  Service: Cardiopulmonary;  Laterality: Right;  :  Allergies[1]:   Family History  Problem Relation Age of Onset   Diabetes Mother    Hypertension Sister    Heart disease Sister    Diabetes Sister    Arthritis Brother    Gout Brother    Colon cancer Neg Hx    Esophageal cancer Neg Hx    Rectal cancer Neg Hx    Stomach cancer Neg Hx    Colon polyps Neg Hx    Inflammatory bowel disease Neg Hx    Liver disease Neg Hx    Pancreatic cancer Neg Hx   :   Social History   Socioeconomic History   Marital status: Single    Spouse name: Not on file   Number of children: 0    Years of education: Not on file   Highest education level: Not on file  Occupational History   Not on file  Tobacco Use   Smoking status: Some Days    Current packs/day: 0.75    Average packs/day: 0.8 packs/day for 55.0 years (41.3 ttl pk-yrs)    Types: Cigarettes   Smokeless tobacco: Never   Tobacco comments:    Smokes 2 -3 cigarettes per day sometimes.  Vaping Use   Vaping status: Never Used  Substance and Sexual Activity   Alcohol use: Not Currently   Drug use: No   Sexual activity: Not on file  Other Topics Concern   Not on file  Social History Narrative   Patient is single reports no children   Smoker, no current alcohol or drug use   Social Drivers of Health   Tobacco Use: High Risk (07/20/2024)   Patient History    Smoking Tobacco Use: Some Days    Smokeless Tobacco Use: Never    Passive Exposure: Not on file  Financial Resource Strain: Low Risk (06/22/2024)   Overall Financial Resource Strain (CARDIA)    Difficulty of Paying Living Expenses: Not hard at all  Food Insecurity: No Food Insecurity (07/13/2024)   Epic    Worried About Radiation Protection Practitioner of Food in the Last Year: Never true    Ran Out of Food in the Last Year: Never true  Transportation Needs: No Transportation Needs (07/13/2024)   Epic    Lack of Transportation (Medical): No    Lack of Transportation (Non-Medical): No  Physical Activity: Inactive (06/22/2024)   Exercise Vital Sign    Days of Exercise per Week: 0 days    Minutes of Exercise per Session: 0 min  Stress: No Stress Concern Present (06/22/2024)   [employer] of Occupational Health - Occupational Stress Questionnaire    Feeling of Stress: Not at all  Social Connections: Moderately Isolated (07/13/2024)   Social Connection and Isolation Panel    Frequency of Communication with Friends and Family: More than three times a week    Frequency of Social Gatherings with Friends and Family: Twice a week    Attends Religious Services: More than 4 times  per year    Active Member of [employer] or Organizations: No    Attends Banker Meetings: Never    Marital Status: Divorced  Catering Manager Violence: Not At Risk (07/13/2024)   Epic    Fear of Current or Ex-Partner: No    Emotionally Abused: No    Physically Abused: No    Sexually Abused: No  Depression (PHQ2-9): Low Risk (06/22/2024)   Depression (PHQ2-9)  PHQ-2 Score: 0  Alcohol Screen: Low Risk (06/22/2024)   Alcohol Screen    Last Alcohol Screening Score (AUDIT): 0  Housing: Low Risk (07/13/2024)   Epic    Unable to Pay for Housing in the Last Year: No    Number of Times Moved in the Last Year: 0    Homeless in the Last Year: No  Utilities: Not At Risk (07/13/2024)   Epic    Threatened with loss of utilities: No  Health Literacy: Adequate Health Literacy (06/22/2024)   B1300 Health Literacy    Frequency of need for help with medical instructions: Rarely  :    PHYSICAL EXAMINATION: ECOG PERFORMANCE STATUS: 4 - Bedbound  Vitals:   07/22/24 1330 07/22/24 1400  BP: 116/79   Pulse: 72   Resp: 18   Temp:    SpO2: 100% 100%   Filed Weights   07/11/24 0522 07/12/24 1624 07/21/24 1606  Weight: 132 lb 4.4 oz (60 kg) 135 lb 2.3 oz (61.3 kg) 150 lb 2.1 oz (68.1 kg)    GENERAL: +lethargic +chronically ill-appearing +cachectic SKIN: skin color, texture, turgor are normal, no rashes or significant lesions EYES: normal, conjunctiva are pink and non-injected, sclera clear OROPHARYNX: no exudate, no erythema and lips, buccal mucosa, and tongue normal  NECK: supple, thyroid  normal size, non-tender, without nodularity LYMPH: no palpable lymphadenopathy in the cervical, axillary or inguinal LUNGS: clear to auscultation and percussion with normal breathing effort HEART: regular rate & rhythm and no murmurs and no lower extremity edema ABDOMEN: abdomen soft, non-tender and normal bowel sounds MUSCULOSKELETAL: no cyanosis of digits and no clubbing  PSYCH: +Lethargic NEURO: no  focal motor/sensory deficits   LABS: Lab Results  Component Value Date   WBC 9.2 07/22/2024   HGB 8.1 (L) 07/22/2024   HCT 24.4 (L) 07/22/2024   MCV 87.1 07/22/2024   PLT 91 (L) 07/22/2024    Lab Results  Component Value Date   WBC 9.2 07/22/2024   HGB 8.1 (L) 07/22/2024   HCT 24.4 (L) 07/22/2024   PLT 91 (L) 07/22/2024   GLUCOSE 79 07/22/2024   CHOL 124 03/15/2024   TRIG 78 07/22/2024   HDL 46 03/15/2024   LDLCALC 67 03/15/2024   ALT 81 (H) 07/21/2024   AST 82 (H) 07/21/2024   NA 138 07/22/2024   K 4.6 07/22/2024   CL 104 07/22/2024   CREATININE 1.41 (H) 07/22/2024   BUN 31 (H) 07/22/2024   CO2 23 07/22/2024   INR 1.2 07/22/2024   HGBA1C 5.4 07/21/2024       I personally spent a total of 40 minutes minutes in the care of the patient today including preparing to see the patient, getting/reviewing separately obtained history, performing a medically appropriate exam/evaluation, referring and communicating with other health care professionals, communicating results, and coordinating care.    All questions were answered. The patient knows to call the clinic with any problems, questions or concerns. No barriers to learning was detected.  Thank you for the courtesy of this consultation, Olam JINNY Brunner, NP  2/5/20262:03 PM         [1]  Allergies Allergen Reactions   Candesartan Cilexetil-Hctz Other (See Comments)    Acute renal failure   Hydrochlorothiazide Other (See Comments)    Acute renal failure   Shellfish Allergy Other (See Comments)    Causes GOUT   Betadine [Povidone Iodine ] Itching   "

## 2024-07-22 NOTE — Progress Notes (Signed)
 eLink Physician-Brief Progress Note Patient Name: [name] DOB: [birth year] MRN: [id number]   Date of Service  07/22/2024  HPI/Events of Note  VBG result noted.  eICU Interventions  Respiratory rate increased from 15 to 20.        Marcellina PENNER Morse Brueggemann 07/22/2024, 9:57 PM

## 2024-07-22 NOTE — Progress Notes (Signed)
 eLink Physician-Brief Progress Note Patient Name: [name] DOB: September 28, 1945 MRN: [id number]   Date of Service  07/22/2024  HPI/Events of Note  Patient sat up in bed.  eICU Interventions  Versed  2 mg iv x 1, Propofol  gtt restarted.        Romulo Okray U Ashtynn Berke 07/22/2024, 2:48 AM

## 2024-07-22 NOTE — Progress Notes (Signed)
 "  NAME:  [name] MCHARGUE, MRN:  [id number], DOB:  1945/08/07, LOS: 9 ADMISSION DATE:  07/11/2024, CONSULTATION DATE:  07/22/24 REFERRING MD:  Francesco, CHIEF COMPLAINT:  GIB   History of Present Illness:  [name] is a 79 y.o. M with PMH significant for COPD on 3L home O2, AAA s/p repair in 2025, CAD on plavix  and asa, Atrial fibrillation not on anticoagulation, CKD 3, stage 1B non-small cell lung cancer s/p RUL lobectomy in 2007, chronic bladder outlet obstruction with indwelling foley, Hx of ETOH abuse and Barrett's esophagus who presented initially on 1/25 with chest pain and dyspnea secondary to COPD exacerbation.  This has now improved, however started experiencing melena and underwent an  EGD on 1/28 and ampullary adenoma and gastritis biopsies completed.    He continued to have melanotic stools, so had another EGD on 2/3 which showed oozing from the biopsy sites, on 2/2 a capsule endoscopy showed red blood in the stomach and small bowel.  These were clipped, however bleeding persisted and between 2/3 and 2/4 pt was given 1 unit PRBC's, 4 platelets, 2 FFP.  A CTA bleeding scan was ordered and critical care consulted in the setting of persistent GIB.   Hgb dropped to 6.7, improved to 7.6 with the above.  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   Significant Hospital Events: Including procedures, antibiotic start and stop dates in addition to other pertinent events   1/25 admit to IMTS  2/4 persistent GIB after EGD and bleeding sites were clipped, CTA bleeding scan and ICU consult  Interim History / Subjective:  Pt has had multiple episodes of dark red liquid blood today, RN estimates last one was 300cc MAP 100's, patient alert, denies abdominal pain  Objective    Blood pressure (!) 129/95, pulse (!) 117, temperature 99 F (37.2 C), resp. rate 13, height 6' (1.829 m), weight 68.1 kg, SpO2 100%.    Vent Mode: PRVC FiO2 (%):  [40 %-100 %] 40 % Set Rate:  [18 bmp] 18 bmp Vt Set:  [620 mL] 620 mL PEEP:  [5 cmH20] 5 cmH20 Plateau Pressure:  [15 cmH20-18 cmH20] 15 cmH20   Intake/Output Summary (Last 24 hours) at 07/22/2024 1200 Last data filed at 07/22/2024 1200 Gross per 24 hour  Intake 3609.95 ml  Output 2750 ml  Net 859.95 ml   Filed Weights   07/11/24 0522 07/12/24 1624 07/21/24 1606  Weight: 60 kg 61.3 kg 68.1 kg   General:  thin, ill appearing M resting in bed in NAD HEENT: MM pink/moist, sclera anicteric, PERRLA  Neuro: alert and oriented CV: s1s2 rrr, no m/r/g PULM:  clear bilaterally on [place] oxygen  GI: soft, non-tender to palpation  Extremities: warm/dry, no edema     Resolved problem list   Assessment and  Plan   ABLA secondary to GIB Chronic anemia and thrombocytopenia   -EGD on 1/28 with biopsies ; no stigmata of bleeding then -1/31 CSY with  polypectomy  -2/2 capsule endoscopy showed blood in stomach and small bowel -2/3 S/p EGDb which showed oozing from prior bx sites of EGD from 1/31; found adherent clot at each biopsy site with one oozing blood which was  though to be the culprit of bleeding; clips were placed  -Bleeding continued and pt was transferred to ICU for further management  -CTA not able to localize bleeding  -Very unusual for bleeding from mx sites so suspect thrombocytopenia/coagulopathy; hematology consulted  -repeat CTA if significant rebleeding occurs or contact GI for repeat EGD -TEG shows some abnormalities; will transfuse 1 unit of FFP today  Plan:  -If re-bleeding occurs: repeat CTA or contact GI for EGD  -transfuse 1 unit of Ffp today  -f/u further coagulapathy work-up ordered by Hematology  -hold plavix  - transfuse to keep platelets > 50, - hematology consultation - trend Hgb, BUN, coagulation studies - continue protonix  40mg  BID   Intubation 2/2 respiratory failure 07/21/24 Chronic Hypoxic Respiratory Failure secondary to COPD on 3L home O2 Tobacco abuse History of stage I squamous cell lung cancer-surgically resected  -required intubation for respiratory distress  -became agitated and hypoxic and required intubation -repeat cxr shows worsening edema; findings similar to prior CXR which r/o TRALI    Plan: -Lasix  for pulmonary edema  -SBT and wean off vent when able  -will transition to precedex  from propofol  for sedation  -continue triple therapy daily   -continued tobacco cessation counseling when appropriate -follows with Dr. Zaida, on Stiolto Respimat     PAD Stable AAA Initial concern for possible endoleak on CT  -seen by vascular, no need for intervention -avoid HTN, continue to monitor    Persistent Atrial Fibrillation  CAD Hx Trifascicular Block -rate controlled at home with metoprolol  -Start BB if HR increases  -not anti-coagulated secondary to the hx of GIB -follows with EP outpatient  -hold bb in the setting of GIB and possible developing shock    Labs   CBC: Recent Labs  Lab 07/21/24 0354 07/21/24 0821 07/21/24 1804 07/21/24 1849 07/21/24 2221 07/22/24 0920  WBC 8.8 9.7  10.6*  --  11.7* 9.2  NEUTROABS 6.4 7.8* 9.3*  --  9.6* 6.5  HGB 8.0* 7.6* 8.9* 7.8* 8.3* 8.1*  HCT 23.6* 23.3* 26.9* 23.0* 24.8* 24.4*  MCV 86.1 86.3 87.1  --  85.8 87.1  PLT 45* 67* 125*  --  105* 91*    Basic Metabolic Panel: Recent Labs  Lab 07/19/24 0603 07/20/24 0712 07/21/24 0821 07/21/24 1804 07/21/24 1849 07/22/24 0920  NA 139 138 138 137 139 138  K 5.0 4.9 4.7 4.4 4.2 4.6  CL 109 109 106 103  --  104  CO2 21* 24 25 23   --  23  GLUCOSE 80 94 130* 110*  --  79  BUN 41* 34* 33* 32*  --  31*  CREATININE 1.58* 1.39* 1.38* 1.28*  --  1.41*  CALCIUM  7.9* 8.2* 8.0* 8.5*  --  8.0*   GFR: Estimated Creatinine Clearance: 41.6 mL/min (A) (by C-G formula based on SCr of 1.41 mg/dL (H)). Recent Labs  Lab 07/18/24 1917 07/19/24 0506 07/21/24 0821 07/21/24 1804 07/21/24 1809 07/21/24 2221 07/22/24 0920  WBC 5.4   < > 9.7 10.6*  --  11.7* 9.2  LATICACIDVEN 1.7  --   --   --  2.2* 1.8  --    < > = values in this interval not displayed.    Liver Function Tests: Recent Labs  Lab 07/21/24 1804  AST 82*  ALT 81*  ALKPHOS 62  BILITOT 0.9  PROT 5.7*  ALBUMIN  3.5   No results for input(s): LIPASE, AMYLASE in the last 168 hours. Recent Labs  Lab 07/21/24 1804  AMMONIA 42*    ABG    Component Value Date/Time   PHART 7.389 07/21/2024 1849   PCO2ART 39.3 07/21/2024 1849   PO2ART 335 (H) 07/21/2024 1849   HCO3 23.8 07/21/2024 1849   TCO2 25 07/21/2024 1849   ACIDBASEDEF 1.0 07/21/2024 1849   O2SAT 100 07/21/2024 1849     Coagulation Profile: Recent Labs  Lab 07/18/24 2105 07/21/24 0821 07/22/24 0920  INR 1.3* 1.2 1.2    Cardiac Enzymes: No results for input(s): CKTOTAL, CKMB, CKMBINDEX, TROPONINI in the last 168 hours.  HbA1C: Hemoglobin A1C  Date/Time Value Ref Range Status  09/23/2022 10:32 AM 5.9 (A) 4.0 - 5.6 % Final   Hgb A1c MFr Bld  Date/Time Value Ref Range Status  07/21/2024 06:02 PM 5.4 4.8 - 5.6 % Final    Comment:     (NOTE) Diagnosis of Diabetes The following HbA1c ranges recommended by the American Diabetes Association (ADA) may be used as an aid in the diagnosis of diabetes mellitus.  Hemoglobin             Suggested A1C NGSP%              Diagnosis  <5.7                   Non Diabetic  5.7-6.4                Pre-Diabetic  >6.4                   Diabetic  <7.0                   Glycemic control for                       adults with diabetes.    04/27/2021 08:48 AM 5.5 4.8 - 5.6 % Final    Comment:    (NOTE) Pre diabetes:          5.7%-6.4%  Diabetes:              >6.4%  Glycemic control for   <7.0% adults with diabetes     CBG: Recent Labs  Lab 07/21/24 1601 07/22/24 0104 07/22/24 0437 07/22/24 0844 07/22/24 1147  GLUCAP 98 103* 78 72 105*    Review of Systems:   Please see the history of present illness. 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   Surgical History:   Past Surgical History:  Procedure Laterality  Date   ABDOMINAL AORTIC ENDOVASCULAR FENESTRATED STENT GRAFT N/A 08/16/2022   Procedure: ABDOMINAL AORTIC ENDOVASCULAR FENESTRATED STENT GRAFT;  Surgeon: [name] ORN, MD;   Location: [facility] OR;  Service: Vascular;  Laterality: N/A;   ABDOMINAL AORTIC ENDOVASCULAR FENESTRATED STENT GRAFT N/A 12/02/2023   Procedure: ABDOMINAL AORTIC ENDOVASCULAR FENESTRATED STENT GRAFT WITH ULTRASOUND GUIDED ACCESS;  Surgeon: [name] ORN, MD;  Location: [facility] INVASIVE CV LAB;  Service: Cardiovascular;  Laterality: N/A;   BIOPSY  11/12/2020   Procedure: BIOPSY;  Surgeon: Legrand Victory LITTIE DOUGLAS, MD;  Location: [facility] ENDOSCOPY;  Service: Gastroenterology;;   BIOPSY  07/25/2021   Procedure: BIOPSY;  Surgeon: Stacia Glendia BRAVO, MD;  Location: [facility] ENDOSCOPY;  Service: Gastroenterology;;   BIOPSY  12/24/2021   Procedure: BIOPSY;  Surgeon: Wilhelmenia Aloha Raddle., MD;  Location: [facility] ENDOSCOPY;  Service: Gastroenterology;;   BIOPSY OF SKIN SUBCUTANEOUS TISSUE AND/OR MUCOUS MEMBRANE  07/14/2024   Procedure: BIOPSY, SKIN, SUBCUTANEOUS TISSUE, OR MUCOUS MEMBRANE;  Surgeon: Wilhelmenia Aloha Raddle., MD;  Location: [facility] ENDOSCOPY;  Service: Gastroenterology;;   BRONCHIAL BIOPSY  07/27/2021   Procedure: BRONCHIAL BIOPSIES;  Surgeon: Shelah Lamar RAMAN, MD;  Location: [facility] ENDOSCOPY;  Service: Cardiopulmonary;;   BRONCHIAL BRUSHINGS  07/27/2021   Procedure: BRONCHIAL BRUSHINGS;  Surgeon: Shelah Lamar RAMAN, MD;  Location: [facility] ENDOSCOPY;  Service: Cardiopulmonary;;   cataract surgery Bilateral 2018   removal of cataracts   COLONOSCOPY     hx polyps - [place] 02/2012   COLONOSCOPY N/A 07/17/2024   Procedure: COLONOSCOPY;  Surgeon: Wilhelmenia Aloha Raddle., MD;  Location: [facility] ENDOSCOPY;  Service: Gastroenterology;  Laterality: N/A;   COLONOSCOPY WITH PROPOFOL  N/A 11/12/2020   Procedure: COLONOSCOPY WITH PROPOFOL ;  Surgeon: Legrand Victory LITTIE DOUGLAS, MD;  Location: [facility] ENDOSCOPY;  Service: Gastroenterology;  Laterality: N/A;   CORONARY ANGIOPLASTY WITH STENT PLACEMENT     ENDOVASCULAR STENT INSERTION Bilateral 08/16/2022   Procedure: ENDOVASCULAR STENT GRAFT INSERTION OF BILATERAL RENAL ARTERIES;  Surgeon: [name] ORN, MD;  Location: [facility]  OR;  Service: Vascular;  Laterality: Bilateral;   ESOPHAGOGASTRODUODENOSCOPY N/A 07/14/2024   Procedure: EGD (ESOPHAGOGASTRODUODENOSCOPY);  Surgeon: Wilhelmenia Aloha Raddle., MD;  Location: [facility] ENDOSCOPY;  Service: Gastroenterology;  Laterality: N/A;   ESOPHAGOGASTRODUODENOSCOPY N/A 07/20/2024   Procedure: EGD (ESOPHAGOGASTRODUODENOSCOPY);  Surgeon: Leigh Elspeth SQUIBB, MD;  Location: [facility] ENDOSCOPY;  Service: Gastroenterology;  Laterality: N/A;   ESOPHAGOGASTRODUODENOSCOPY (EGD) WITH PROPOFOL  N/A 11/12/2020   Procedure: ESOPHAGOGASTRODUODENOSCOPY (EGD) WITH PROPOFOL ;  Surgeon: Legrand Victory LITTIE DOUGLAS, MD;  Location: [facility] ENDOSCOPY;  Service: Gastroenterology;  Laterality: N/A;   ESOPHAGOGASTRODUODENOSCOPY (EGD) WITH PROPOFOL  N/A 03/06/2021   Procedure: ESOPHAGOGASTRODUODENOSCOPY (EGD) WITH PROPOFOL ;  Surgeon: Leigh Elspeth SQUIBB, MD;  Location: [facility] ENDOSCOPY;  Service: Gastroenterology;  Laterality: N/A;   ESOPHAGOGASTRODUODENOSCOPY (EGD) WITH PROPOFOL  N/A 07/25/2021   Procedure: ESOPHAGOGASTRODUODENOSCOPY (EGD) WITH PROPOFOL ;  Surgeon: Stacia Glendia BRAVO, MD;  Location: [facility] ENDOSCOPY;  Service: Gastroenterology;  Laterality: N/A;   ESOPHAGOGASTRODUODENOSCOPY (EGD) WITH PROPOFOL  N/A 12/24/2021   Procedure: ESOPHAGOGASTRODUODENOSCOPY (EGD) WITH PROPOFOL ;  Surgeon: Wilhelmenia Aloha Raddle., MD;  Location: [facility] ENDOSCOPY;  Service: Gastroenterology;  Laterality: N/A;   EUS N/A 07/14/2024   Procedure: ULTRASOUND, UPPER GI TRACT, ENDOSCOPIC;  Surgeon: Wilhelmenia Aloha Raddle., MD;  Location: [facility] ENDOSCOPY;  Service: Gastroenterology;  Laterality: N/A;   flexible cystourethroscopy  05/24/06   GIVENS CAPSULE STUDY N/A 07/18/2024   Procedure: IMAGING PROCEDURE, GI TRACT, INTRALUMINAL, VIA CAPSULE;  Surgeon: Mansouraty, Aloha Raddle., MD;  Location: [facility] ENDOSCOPY;  Service: Gastroenterology;  Laterality: N/A;   HEMOSTASIS CLIP PLACEMENT  03/06/2021   Procedure: HEMOSTASIS CLIP PLACEMENT;  Surgeon: Leigh Elspeth SQUIBB, MD;  Location:  [facility] ENDOSCOPY;  Service: Gastroenterology;;   HEMOSTASIS CLIP PLACEMENT  12/24/2021   Procedure: HEMOSTASIS CLIP PLACEMENT;  Surgeon: Wilhelmenia Aloha Raddle., MD;  Location: [facility] ENDOSCOPY;  Service: Gastroenterology;;   HEMOSTASIS CLIP PLACEMENT  07/20/2024   Procedure: CONTROL OF HEMORRHAGE, GI TRACT, ENDOSCOPIC, BY CLIPPING OR OVERSEWING;  Surgeon: Leigh Elspeth SQUIBB, MD;  Location: [facility] ENDOSCOPY;  Service: Gastroenterology;;   HOT HEMOSTASIS N/A 03/06/2021   Procedure: HOT HEMOSTASIS (ARGON PLASMA COAGULATION/BICAP);  Surgeon: Leigh Elspeth SQUIBB, MD;  Location: [facility] ENDOSCOPY;  Service: Gastroenterology;  Laterality: N/A;   INGUINAL HERNIA REPAIR  2005   INSERTION OF ILIAC STENT Bilateral 08/16/2022   Procedure: INSERTION OF ILIAC STENT RIGHT EXTERNAL ARTERY, LEFT COMMON ARTERY, AND RIGHT COMMON ARTERY;  Surgeon: [name] ORN, MD;  Location: [facility] OR;  Service: Vascular;  Laterality: Bilateral;   IR CATHETER TUBE CHANGE  07/08/2022   LUNG LOBECTOMY Right 11/07   RUL   MULTIPLE TOOTH EXTRACTIONS     POLYPECTOMY  11/12/2020   Procedure: POLYPECTOMY;  Surgeon: Legrand Victory LITTIE DOUGLAS, MD;  Location: [facility] ENDOSCOPY;  Service: Gastroenterology;;   POLYPECTOMY  12/24/2021   Procedure: POLYPECTOMY;  Surgeon: Wilhelmenia Aloha Raddle., MD;  Location: [facility] ENDOSCOPY;  Service: Gastroenterology;;   POLYPECTOMY  07/17/2024   Procedure: POLYPECTOMY, INTESTINE;  Surgeon: Wilhelmenia Aloha Raddle., MD;  Location: [facility] ENDOSCOPY;  Service: Gastroenterology;;   SUBMUCOSAL LIFTING INJECTION  12/24/2021   Procedure: SUBMUCOSAL LIFTING INJECTION;  Surgeon: Wilhelmenia Aloha Raddle., MD;  Location: [facility] ENDOSCOPY;  Service: Gastroenterology;;   TRANSURETHRAL RESECTION OF PROSTATE N/A 05/02/2021   Procedure: TRANSURETHRAL RESECTION OF THE PROSTATE (TURP);  Surgeon: Carolee Sherwood JONETTA DOUGLAS, MD;  Location: [facility] ORS;  Service: Urology;  Laterality: N/A;   ULTRASOUND GUIDANCE FOR VASCULAR ACCESS Bilateral 08/16/2022   Procedure: ULTRASOUND GUIDANCE  FOR VASCULAR ACCESS;  Surgeon: [name] ORN, MD;  Location: [facility] OR;  Service: Vascular;  Laterality: Bilateral;   UPPER ESOPHAGEAL ENDOSCOPIC ULTRASOUND (EUS) N/A 12/24/2021   Procedure: UPPER ESOPHAGEAL ENDOSCOPIC ULTRASOUND (EUS);  Surgeon: Wilhelmenia Aloha Raddle., MD;  Location: [facility]COPY;  Service: Gastroenterology;  Laterality: N/A;   UPPER GASTROINTESTINAL ENDOSCOPY     VIDEO BRONCHOSCOPY  07/27/2021   Procedure: VIDEO BRONCHOSCOPY WITHOUT FLUORO;  Surgeon: Shelah Lamar RAMAN, MD;  Location: [facility] ENDOSCOPY;  Service: Cardiopulmonary;;   VIDEO BRONCHOSCOPY Right 05/14/2024   Procedure: VIDEO BRONCHOSCOPY WITHOUT FLUORO;  Surgeon: Jude Harden GAILS, MD;  Location: [facility] ENDOSCOPY;  Service: Cardiopulmonary;  Laterality: Right;     Social History:   reports that he has been smoking cigarettes. He has a 41.3 pack-year smoking history. He has never used smokeless tobacco. He reports that he does not currently use alcohol. He reports that he does not use drugs.   Family History:  His family history includes Arthritis in his brother; Diabetes in his mother and sister; Gout in his brother; Heart disease in his sister; Hypertension in his sister. There is no history of Colon cancer, Esophageal cancer, Rectal cancer, Stomach cancer, Colon polyps, Inflammatory bowel disease, Liver disease, or Pancreatic cancer.   Allergies Allergies[1]   Home Medications  Prior to Admission medications  Medication Sig Start Date End Date Taking? Authorizing Provider  albuterol  (VENTOLIN  HFA) 108 (90 Base) MCG/ACT inhaler Inhale 1-2 puffs into the lungs every 6 (six) hours as needed for wheezing or shortness of breath. 12/23/23  Yes [name], DO  amLODipine  (NORVASC ) 10  MG tablet Take 1 tablet (10 mg total) by mouth daily. 12/09/23 12/08/24 Yes [name], DO  aspirin  EC 81 MG tablet Take 1 tablet (81 mg total) by mouth daily. Swallow whole. 04/14/23  Yes [name] ORN, MD  clopidogrel  (PLAVIX ) 75 MG tablet  Take 1 tablet (75 mg total) by mouth daily. 12/11/23  Yes Bethanie Cough, PA-C  escitalopram  (LEXAPRO ) 5 MG tablet Take 1 tablet (5 mg total) by mouth daily. 07/08/24 01/04/25 Yes [name], DO  HYDROcodone -acetaminophen  (NORCO) 7.5-325 MG tablet Take 1 tablet by mouth 2 (two) times daily as needed for moderate pain (pain score 4-6). Fill 30 days after last fill. 06/24/24  Yes [name], DO  metoprolol  succinate (TOPROL -XL) 25 MG 24 hr tablet Take 1 tablet (25 mg total) by mouth daily. 06/24/24 06/24/25 Yes [name], DO  mirtazapine  (REMERON ) 7.5 MG tablet Take 2 tablets (15 mg total) by mouth at bedtime. 03/24/24  Yes [name], DO  Pancrelipase , Lip-Prot-Amyl, (ZENPEP ) 20000-63000 units CPEP Take 3 capsules by mouth 3 (three) times daily before meals. 07/05/24  Yes [name], DO  pantoprazole  (PROTONIX ) 40 MG tablet TAKE 1 TABLET EVERY DAY FOR REFLUX 12/09/23  Yes [name], DO  predniSONE  (DELTASONE ) 50 MG tablet Take one tablet 13 hours, 7 hours, and 1 hour prior to scan. 06/23/24  Yes [name] ORN, MD  rosuvastatin  (CRESTOR ) 40 MG tablet Take 1 tablet (40 mg total) by mouth daily. 04/03/23  Yes Jerrell Cleatus Ned, MD  sacubitril -valsartan  (ENTRESTO ) 97-103 MG Take 1 tablet by mouth 2 (two) times daily. 04/19/24  Yes [name], DO  spironolactone  (ALDACTONE ) 25 MG tablet Take 0.5 tablets (12.5 mg total) by mouth daily. 06/07/24 09/05/24 Yes [name], Callie E, PA-C  Tiotropium Bromide-Olodaterol (STIOLTO RESPIMAT ) 2.5-2.5 MCG/ACT AERS Inhale 2 puffs into the lungs daily. 02/27/24  Yes Hattar, Zola SAILOR, MD  diphenhydrAMINE  (BENADRYL ) 50 MG capsule Take one capsule 1 hour prior to scan. Patient not taking: Reported on 07/11/2024 06/23/24   [name] ORN, MD  furosemide  (LASIX ) 40 MG tablet Take 1 tablet (40 mg total) by mouth daily. Patient not taking: Reported on 07/11/2024 05/29/24   Harrie Bruckner, DO  OXYGEN Inhale 3 L/min into the lungs continuous.     [provider]     Critical care time: 60 minutes     CRITICAL CARE Performed by: Rebecka Pion   Total critical care time: 60 minutes  Critical care time was exclusive of separately billable procedures and treating other patients.  Critical care was necessary to treat or prevent imminent or life-threatening deterioration.  Critical care was time spent personally by me on the following activities: development of treatment plan with patient and/or surrogate as well as nursing, discussions with consultants, evaluation of patient's response to treatment, examination of patient, obtaining history from patient or surrogate, ordering and performing treatments and interventions, ordering and review of laboratory studies, ordering and review of radiographic studies, pulse oximetry and re-evaluation of patient's condition.   Leita SAUNDERS Gleason, PA-C [facility] 7a-7p: For contact information, see AMION. If no response to pager, please call PCCM 2-H APP. After 7p: Please call PCCM APP on-call for 2-H.           [1]  Allergies Allergen Reactions   Candesartan Cilexetil-Hctz Other (See Comments)    Acute renal failure   Hydrochlorothiazide Other (See Comments)    Acute renal failure   Shellfish Allergy  Other (See Comments)    Causes GOUT   Betadine [Povidone Iodine ] Itching   "

## 2024-07-22 NOTE — Procedures (Signed)
 Extubation Procedure Note  Patient Details:   Name: [name] DOB: August 04, 1945 MRN: [id number]   Airway Documentation:    Vent end date: 07/22/24 Vent end time: 1400   Evaluation  O2 sats: stable throughout Complications: No apparent complications Patient did tolerate procedure well. Bilateral Breath Sounds: Clear   Yes  Pt was extubated per MD order and placed on 3 L [place]. Cuff leak was noted prior to extubation and no stridor post. Pt is stable at this time. RT will monitor.   Loel Betancur 07/22/2024, 2:03 PM

## 2024-07-23 ENCOUNTER — Inpatient Hospital Stay (HOSPITAL_COMMUNITY)

## 2024-07-23 LAB — CBC WITH DIFFERENTIAL/PLATELET
Abs Immature Granulocytes: 0.05 10*3/uL (ref 0.00–0.07)
Abs Immature Granulocytes: 0.04 10*3/uL (ref 0.00–0.07)
Basophils Absolute: 0 10*3/uL (ref 0.0–0.1)
Basophils Absolute: 0 10*3/uL (ref 0.0–0.1)
Basophils Relative: 0 %
Basophils Relative: 0 %
Eosinophils Absolute: 0 10*3/uL (ref 0.0–0.5)
Eosinophils Absolute: 0.1 10*3/uL (ref 0.0–0.5)
Eosinophils Relative: 0 %
Eosinophils Relative: 1 %
HCT: 25 % — ABNORMAL LOW (ref 39.0–52.0)
HCT: 21 % — ABNORMAL LOW (ref 39.0–52.0)
Hemoglobin: 8.3 g/dL — ABNORMAL LOW (ref 13.0–17.0)
Hemoglobin: 6.8 g/dL — CL (ref 13.0–17.0)
Immature Granulocytes: 0 %
Immature Granulocytes: 1 %
Lymphocytes Relative: 6 %
Lymphocytes Relative: 7 %
Lymphs Abs: 0.6 10*3/uL — ABNORMAL LOW (ref 0.7–4.0)
Lymphs Abs: 0.6 10*3/uL — ABNORMAL LOW (ref 0.7–4.0)
MCH: 29.5 pg (ref 26.0–34.0)
MCH: 28.9 pg (ref 26.0–34.0)
MCHC: 33.2 g/dL (ref 30.0–36.0)
MCHC: 32.4 g/dL (ref 30.0–36.0)
MCV: 89 fL (ref 80.0–100.0)
MCV: 89.4 fL (ref 80.0–100.0)
Monocytes Absolute: 1.1 10*3/uL — ABNORMAL HIGH (ref 0.1–1.0)
Monocytes Absolute: 0.8 10*3/uL (ref 0.1–1.0)
Monocytes Relative: 10 %
Monocytes Relative: 9 %
Neutro Abs: 9.3 10*3/uL — ABNORMAL HIGH (ref 1.7–7.7)
Neutro Abs: 6.7 10*3/uL (ref 1.7–7.7)
Neutrophils Relative %: 84 %
Neutrophils Relative %: 82 %
Platelets: 83 10*3/uL — ABNORMAL LOW (ref 150–400)
Platelets: 70 10*3/uL — ABNORMAL LOW (ref 150–400)
RBC: 2.81 MIL/uL — ABNORMAL LOW (ref 4.22–5.81)
RBC: 2.35 MIL/uL — ABNORMAL LOW (ref 4.22–5.81)
RDW: 19.9 % — ABNORMAL HIGH (ref 11.5–15.5)
RDW: 19.7 % — ABNORMAL HIGH (ref 11.5–15.5)
Smear Review: NORMAL
WBC: 11.2 10*3/uL — ABNORMAL HIGH (ref 4.0–10.5)
WBC: 8.2 10*3/uL (ref 4.0–10.5)
nRBC: 0 % (ref 0.0–0.2)
nRBC: 0 % (ref 0.0–0.2)

## 2024-07-23 LAB — TYPE AND SCREEN
ABO/RH(D): O POS
Antibody Screen: NEGATIVE
Unit division: 0
Unit division: 0
Unit division: 0
Unit division: 0

## 2024-07-23 LAB — BPAM RBC
Blood Product Expiration Date: 202602262359
Blood Product Expiration Date: 202603022359
Blood Product Expiration Date: 202603042359
Blood Product Expiration Date: 202603042359
ISSUE DATE / TIME: 202602032100
ISSUE DATE / TIME: 202602040055
ISSUE DATE / TIME: 202602041342
ISSUE DATE / TIME: 202602061553
Unit Type and Rh: 202602262359
Unit Type and Rh: 202603042359
Unit Type and Rh: 5100
Unit Type and Rh: 5100
Unit Type and Rh: 9500
Unit Type and Rh: 9500

## 2024-07-23 LAB — BPAM CRYOPRECIPITATE
Blood Product Expiration Date: 202602092359
ISSUE DATE / TIME: 202602051045
Unit Type and Rh: 5100

## 2024-07-23 LAB — BASIC METABOLIC PANEL WITH GFR
Anion gap: 10 (ref 5–15)
BUN: 33 mg/dL — ABNORMAL HIGH (ref 8–23)
CO2: 24 mmol/L (ref 22–32)
Calcium: 7.8 mg/dL — ABNORMAL LOW (ref 8.9–10.3)
Chloride: 103 mmol/L (ref 98–111)
Creatinine, Ser: 1.53 mg/dL — ABNORMAL HIGH (ref 0.61–1.24)
GFR, Estimated: 46 mL/min — ABNORMAL LOW
Glucose, Bld: 122 mg/dL — ABNORMAL HIGH (ref 70–99)
Potassium: 4.1 mmol/L (ref 3.5–5.1)
Sodium: 136 mmol/L (ref 135–145)

## 2024-07-23 LAB — GLUCOSE, CAPILLARY
Glucose-Capillary: 100 mg/dL — ABNORMAL HIGH (ref 70–99)
Glucose-Capillary: 106 mg/dL — ABNORMAL HIGH (ref 70–99)
Glucose-Capillary: 133 mg/dL — ABNORMAL HIGH (ref 70–99)
Glucose-Capillary: 114 mg/dL — ABNORMAL HIGH (ref 70–99)
Glucose-Capillary: 108 mg/dL — ABNORMAL HIGH (ref 70–99)

## 2024-07-23 LAB — PREPARE CRYOPRECIPITATE: Unit division: 0

## 2024-07-23 LAB — PREPARE RBC (CROSSMATCH)

## 2024-07-23 LAB — PHOSPHORUS: Phosphorus: 4 mg/dL (ref 2.5–4.6)

## 2024-07-23 LAB — MAGNESIUM: Magnesium: 1.9 mg/dL (ref 1.7–2.4)

## 2024-07-23 LAB — APTT: aPTT: 39 s — ABNORMAL HIGH (ref 24–36)

## 2024-07-23 LAB — PROTIME-INR
INR: 1.2 (ref 0.8–1.2)
Prothrombin Time: 15.4 s — ABNORMAL HIGH (ref 11.4–15.2)

## 2024-07-23 LAB — METHYLMALONIC ACID, SERUM: Methylmalonic Acid, Quantitative: 359 nmol/L (ref 0–378)

## 2024-07-23 MED ORDER — OLANZAPINE 10 MG IM SOLR: 2.5000 mg | Freq: Once | INTRAMUSCULAR | Status: AC | PRN

## 2024-07-23 MED ORDER — SODIUM CHLORIDE 0.9% IV SOLUTION: Freq: Once | INTRAVENOUS | Status: AC

## 2024-07-23 MED ORDER — STERILE WATER FOR INJECTION IJ SOLN
INTRAMUSCULAR | Status: AC
  Administered 2024-07-23: 10 mL
  Filled 2024-07-23: qty 10

## 2024-07-23 MED ORDER — STERILE WATER FOR INJECTION IJ SOLN
INTRAMUSCULAR | Status: AC
  Filled 2024-07-23: qty 10

## 2024-07-23 MED ORDER — ZIPRASIDONE MESYLATE 20 MG IM SOLR
10.0000 mg | Freq: Once | INTRAMUSCULAR | Status: AC
  Administered 2024-07-23: 10 mg via INTRAMUSCULAR
  Filled 2024-07-23: qty 20

## 2024-07-23 MED ORDER — PROSOURCE TF20 ENFIT COMPATIBL EN LIQD: 60.0000 mL | Freq: Every day | ENTERAL | Status: DC

## 2024-07-23 MED ORDER — VITAL 1.5 CAL PO LIQD: 1000.0000 mL | ORAL | Status: AC

## 2024-07-23 NOTE — Progress Notes (Signed)
 Nutrition Follow Up  DOCUMENTATION CODES:   Severe malnutrition in context of chronic illness, Underweight  INTERVENTION:  If pt unable to advance past trickle feeds, recommend initiation of TPN; pt has been without adequate nutrition for most of admission (only 2 days of adequate intake in the 12 day admission) Parenteral Nutrition - titrate to meet estimated nutrition needs.  Kcal: 1700-1900 Protein: 70-85 g   Initiate tube feeding via Cortrak 2/7: Vital 1.5 at 20 ml/h (480 ml per day) Advancement per CCM: recommend slow advancement 10 ml q12h to goal rate when able Goal rate: Vital 1.5 @ 50 ml/h (1200 ml daily) Goal rate provides 1800 kcal, 81 gm protein, 916 ml free water  daily  Pt at risk for refeeding syndrome given inadequate intake for > 7 days and severe malnutrition Recommend thiamine  100 mg IV Continued monitoring of electrolytes until stable, MD to replete as needed  NUTRITION DIAGNOSIS:   Severe Malnutrition related to chronic illness as evidenced by severe fat depletion, severe muscle depletion. 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 Admitted with SOB and acute respiratory failure.  1/25 admitted, NPO 1/27 FLD 1/28 NPO s/p EUS, EGD; then Heart Healthy 1/30 CLD 1/31 NPO then CLD; s/p colonoscopy 2/1 NPO 2/3 CLD; EGD active bleeding 2/4 NPO; transferred to ICU, intubated 2/5 extubated to BiPAP 2/6 remains NPO, Cortrak placed  Pt remains NPO per GI recommendations. No signs of bleeding in last day, but pt has not had bowel movement since 2/4. Plan to try enteral at trickle rate tomorrow. Plan to initiate swallow eval when mentation improves. GI recommends starting with clear liquids if able to pass swallow.   Pt has  now been without adequate nutrition for 10 out of his 12 day admission. Pt is at a high refeeding risk with initiation of dextrose  today. IV thiamine  ordered but pt has been agitated and refusing blood draws. Recommend monitoring electrolyte labs and ordering repletion as needed. Left recommendations for initiation of trickle feeds tomorrow and advancement per CCM.  If pt unable to advance past trickle feeds, recommend starting TPN.   Nutritionally Relevant Medications: Pepcid  Creon  TID Protonix  BID  Labs reviewed: BUN 33/ Cr 1.53 Potassium 4.1 Phos 4.0 Mag 1.9 CBG 100-133 mg/dL x 24 h Hgb 6.8  Admit weight: 61.3 kg (1/26) Current weight: 68.1 kg   Diet Order:   Diet Order             Diet NPO time specified Except for: Ice Chips  Diet effective now                   EDUCATION NEEDS:   Education needs have been addressed  Skin:  Skin Assessment: Reviewed RN Assessment  Last BM:  2/4 type 7  Height:   Ht Readings from Last 1 Encounters:  07/11/24 6' (1.829 m)    Weight:   Wt Readings from Last 1 Encounters:  07/21/24 68.1 kg    Ideal Body Weight:  80.9 kg  BMI:  Body mass index is 20.36 kg/m.  Estimated Nutritional Needs:   Kcal:  1700-1900  Protein:  85-100g  Fluid:  1.7-1.9L    Josette Glance, MS, RDN, LDN Clinical Dietitian I Please  reach out via secure chat

## 2024-07-23 NOTE — Procedures (Signed)
 Cortrak  Person Inserting Tube:  Mady Dolly, RD Tube Type:  Cortrak - 43 inches Tube Size:  10 Tube Location:  Left nare Secured by: Bridle Technique Used to Measure Tube Placement:  Marking at nare/corner of mouth Cortrak Secured At:  65 cm Initial Placement Verification:  Xray  Cortrak Tube Team Note:  Consult received to place a Cortrak feeding tube.   X-ray is required. RN may begin using tube post confirmation of appropriate position.   If the tube becomes dislodged please keep the tube and contact the Cortrak team at [email or web address] for replacement.  If after hours and replacement cannot be delayed, place a NG tube and confirm placement with an abdominal x-ray.    Dolly Mady MS, RD, LDN Registered Dietitian I Clinical Nutrition RD Inpatient Contact Info in Amion

## 2024-07-23 NOTE — Progress Notes (Signed)
 "     Progress Note   Subjective  No bleeding symptoms.  Hemoglobin stable.  He has become combative and agitated, on Zyprexa .  Currently sleeping in bed   Objective   Vital signs in last 24 hours: Temp:  [96.7 F (35.9 C)-98.8 F (37.1 C)] 96.7 F (35.9 C) (02/06 1200) Pulse Rate:  [47-141] 67 (02/06 1130) Resp:  [4-29] 8 (02/06 1130) BP: (64-163)/(47-86) 120/72 (02/06 1130) SpO2:  [84 %-100 %] 100 % (02/06 1130) FiO2 (%):  [40 %] 40 % (02/06 0357) Last BM Date : 07/21/24 General:    AA male in NAD, sleeping / sedated, not answering questions  Intake/Output from previous day: 02/05 0701 - 02/06 0700 In: 1163.6 [I.V.:923.6; Blood:140; NG/GT:100] Out: 1485 [Urine:1485] Intake/Output this shift: Total I/O In: 179.2 [I.V.:179.2] Out: -   Lab Results: Recent Labs    07/22/24 0920 07/22/24 1543 07/22/24 2305  WBC 9.2 11.3* 11.2*  HGB 8.1* 7.9* 8.3*  HCT 24.4* 24.6* 25.0*  PLT 91* 101* 83*   BMET Recent Labs    07/21/24 0821 07/21/24 1804 07/21/24 1849 07/22/24 0920  NA 138 137 139 138  K 4.7 4.4 4.2 4.6  CL 106 103  --  104  CO2 25 23  --  23  GLUCOSE 130* 110*  --  79  BUN 33* 32*  --  31*  CREATININE 1.38* 1.28*  --  1.41*  CALCIUM  8.0* 8.5*  --  8.0*   LFT Recent Labs    07/21/24 1804  PROT 5.7*  ALBUMIN  3.5  AST 82*  ALT 81*  ALKPHOS 62  BILITOT 0.9   PT/INR Recent Labs    07/21/24 0821 07/22/24 0920  LABPROT 16.1* 15.7*  INR 1.2 1.2    Studies/Results: DG Abd Portable 1V Result Date: 07/21/2024 CLINICAL DATA:  Enteric catheter placement EXAM: PORTABLE ABDOMEN - 1 VIEW COMPARISON:  07/21/2024 FINDINGS: Frontal view of the lower chest and upper abdomen demonstrates enteric catheter tip and side port project over the gastric fundus. Endoluminal abdominal aortic stent graft is again noted, with vascular stents in the celiac, SMA, and bilateral renal arteries. Surgical clips right upper quadrant. Mild gaseous distention of the small bowel  within the right upper quadrant, without evidence of obstruction. IMPRESSION: 1. Enteric catheter tip projecting over the gastric fundus. Electronically Signed   By: Ozell Daring M.D.   On: 07/21/2024 18:32   DG CHEST PORT 1 VIEW Result Date: 07/21/2024 CLINICAL DATA:  Intubated EXAM: PORTABLE CHEST 1 VIEW COMPARISON:  07/11/2024 FINDINGS: Two frontal views of the chest demonstrate endotracheal tube overlying tracheal air column, tip approximately 4 cm above carina. Enteric catheter passes below diaphragm, tip and side port projecting over the gastric fundus. Stable enlarged cardiac silhouette and ectasia of the thoracic aorta. Worsening bilateral interstitial and ground-glass opacities, consistent with edema or infection superimposed upon chronic emphysema. Stable right pleural thickening. No pneumothorax. IMPRESSION: 1. Support devices as above. 2. Increased interstitial and ground-glass opacities, consistent with worsening edema or infection superimposed upon chronic emphysema. Electronically Signed   By: Ozell Daring M.D.   On: 07/21/2024 18:30   CT Angio Abd/Pel w/ and/or w/o Result Date: 07/21/2024 CLINICAL DATA:  Lower gastrointestinal bleeding. EXAM: CTA ABDOMEN AND PELVIS WITHOUT AND WITH CONTRAST TECHNIQUE: Multidetector CT imaging of the abdomen and pelvis was performed using the standard protocol during bolus administration of intravenous contrast. Multiplanar reconstructed images and MIPs were obtained and reviewed to evaluate the vascular anatomy. RADIATION DOSE REDUCTION: This  exam was performed according to the departmental dose-optimization program which includes automated exposure control, adjustment of the mA and/or kV according to patient size and/or use of iterative reconstruction technique. CONTRAST:  75mL OMNIPAQUE  IOHEXOL  350 MG/ML SOLN COMPARISON:  June 14, 2024 FINDINGS: VASCULAR Aorta: Status post stent graft repair of abdominal aorta. Graft and limbs are widely patent.  Excluded aneurysmal sac measures 6.2 cm in diameter. Celiac: Patent stent is noted in origin of celiac artery. SMA: Patent stent is noted in origin of superior mesenteric artery. Renals: Patent stents are noted in the origins of both renal arteries. IMA: Occluded at origin presumably due to stent graft. Reconstitution of more distal portions is noted through collateral flow. Inflow: Patent without evidence of aneurysm, dissection, vasculitis or significant stenosis. Proximal Outflow: Bilateral common femoral and visualized portions of the superficial and profunda femoral arteries are patent without evidence of aneurysm, dissection, vasculitis or significant stenosis. Veins: No obvious venous abnormality within the limitations of this arterial phase study. Review of the MIP images confirms the above findings. NON-VASCULAR Lower chest: Small bilateral pleural effusions are noted with minimal adjacent subsegmental atelectasis. Hepatobiliary: Cholelithiasis. No biliary dilatation. No definite hepatic abnormality. Pancreas: 5 mm pancreatic duct stone is noted in pancreatic head. No acute inflammation is noted. No definite ductal dilatation. Pancreatic head calcifications are noted suggesting chronic pancreatitis. Spleen: Normal in size without focal abnormality. Adrenals/Urinary Tract: Adrenal glands appear normal. Bilateral renal cysts are noted. Nonobstructive right nephrolithiasis. No hydronephrosis or renal obstruction is noted. Suprapubic catheter is noted in urinary bladder. Stomach/Bowel: The stomach is unremarkable. No definite evidence of bowel obstruction or inflammation. No definite evidence of contrast extravasation to suggest gastrointestinal bleeding. Lymphatic: No definite adenopathy is noted. Reproductive: Mild prostatic enlargement is noted. Other: No abdominal wall hernia or abnormality. No abdominopelvic ascites. Musculoskeletal: No acute or significant osseous findings. IMPRESSION: 1. Status post  stent graft repair of abdominal aortic aneurysm. Graft and limbs are widely patent. Excluded aneurysmal sac measures 6.2 cm in diameter. 2. Patent stents are noted in the origins of the celiac, superior mesenteric and bilateral renal arteries. 3. 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 4. Small bilateral pleural effusions are noted with minimal adjacent subsegmental atelectasis. 5. Cholelithiasis. 6. 5 mm pancreatic duct stone is noted in pancreatic head. No definite ductal dilatation is noted. Pancreatic head calcifications are noted suggesting chronic pancreatitis. 7. Nonobstructive right nephrolithiasis. 8. Suprapubic catheter is noted in urinary bladder. 9. Mild prostatic enlargement. Electronically Signed   By: Lynwood Landy Raddle M.D.   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 Gastritis / erosions and biopsies taken to rule out H pylori. Colonoscopy done 1/31 - fair prep, multiple polyps removed. Capsule endoscopy then done and interpreted 2/2 - blood in the stomach and small bowel. Repeat EGD 2/3 - at each gastric biopsy site there was adherent clot, one with oozing, thought to be culprit for his bleeding. Clips placed. He has had significant rebleeding in the setting of thrombocytopenia. Given platelet transfusion and FFP and blood and transferred to the ICU. He was intubated for airway protection and altered mental status.   Given the stigmata of bleeding at  multiple biopsy sites in his stomach, which was very unusual, concerned that this bleeding more more reflective of an issue with his thrombocytopenia / coagulation ability. He could have rebled from any of the colon polypectomy sites, or another small bowel source,  or rebled from the gastric biopsy sites that were recently treated. OG placed with rebleeding when the patient was intubated and no blood returned arguing against recurrent gastric bleeding. CTA repeated with active bleeding and no active extravasation - was not able to localize his bleeding.   Thus far since has been in the ICU his bleeding has stopped, his hemoglobin is stable and no further active bleeding.  Appreciate hematology input.  Platelets are in the 80s and would monitor that and keep above 50 with any active bleeding.  Continue to trend hemoglobin.  He has been extubated but his mental status remains altered and is agitated, currently sedated with Zyprexa  and Precedex .  He has definitely improved in the past 48 hours.  Hopefully that remains the case.  If his mental status improves were you think he can take p.o., can start clear liquids today.  If concerned that he is not awake enough to protect his airway consider Cortrak tomorrow for enteral feeds.  Holding Plavix  at this time and monitor for rebleeding. As the patient progresses through his hospitalization and nears discharge, please contact us  so we can coordinate outpatient follow-up and labs that he will need for monitoring.  Continue to hold Plavix  for now given significant recent bleeding.  No plans for additional endoscopic procedures at this time given his recent workup, unless he has rebleeding.  We will follow peripherally for now, call with questions in the interim.   Marcey Naval, MD [facility]  "

## 2024-07-23 NOTE — Progress Notes (Signed)
 Pt continuously yelling with Bipap but was able to be directed until Pt ripped BiPap off and refused to put it back on. RN and multiple staff attempted to explain to pt the necessity of the Bipap but Pt began swinging at staff and using profanity towards everyone in the room. Zyprexa  given at this time and mitts applied

## 2024-07-23 NOTE — Progress Notes (Addendum)
 Physical Therapy Treatment Patient Details Name: [name] MRN: [id number] DOB: 1946-02-13 Today's Date: 07/23/2024   History of Present Illness Patient is a 79 yo male presenting to the ED with chest pain and SOB on 07/11/24. Admitted with acute respiratory failure and GIB. Upper EUS on 1/28, colonoscopy on 1/31, upper GI endoscopy on 2/3 with clipping of ulcers. Pt transferred to ICU on 2/4 due to continued GIB, developed respiratory distress and required intubation on same date. Pt extubated on 2/5. PMH of COPD w/ chronic hypoxic respiratory failure on 3L home O2, AAA s/p repair 11/2023, Afib, HFimpEF, Prostate adenocarcinoma, NSCLC s/p RUL lobectomy, ETOH abuse, HTN, CKD3, and TUD.    PT Comments  Pt is limited by lethargy and fatigue during PT session. Pt requires significant assistance to initiate bed mobility, but once upright becomes more alert. Pt refuses transfer attempts due to reports of fatigue. Pt requires frequent re-orientation to situation, with poor awareness of current deficits. PT downgrades goals and updates plan of care according to pt's current functional status. Patient will benefit from continued inpatient follow up therapy, <3 hours/day.    If plan is discharge home, recommend the following: A lot of help with walking and/or transfers;A lot of help with bathing/dressing/bathroom;Assistance with cooking/housework;Direct supervision/assist for medications management;Direct supervision/assist for financial management;Assist for transportation;Help with stairs or ramp for entrance;Supervision due to cognitive status   Can travel by private vehicle     No  Equipment Recommendations  Rolling walker (2 wheels);BSC/3in1    Recommendations for Other Services       Precautions / Restrictions Precautions Precautions: Fall Recall of Precautions/Restrictions: Impaired Precaution/Restrictions Comments: watch O2; recent bloody BMs; cortrack Restrictions Weight Bearing  Restrictions Per Provider Order: No     Mobility  Bed Mobility Overal bed mobility: Needs Assistance Bed Mobility: Supine to Sit, Sit to Supine     Supine to sit: Max assist Sit to supine: Max assist        Transfers Overall transfer level:  (pt refuses attempts at standing due to fatigue)                      Ambulation/Gait                   Stairs             Wheelchair Mobility     Tilt Bed    Modified Rankin (Stroke Patients Only)       Balance Overall balance assessment: Needs assistance Sitting-balance support: No upper extremity supported, Feet supported Sitting balance-Leahy Scale: Fair                                      Hotel Manager: Impaired Factors Affecting Communication: Reduced clarity of speech  Cognition Arousal: Lethargic Behavior During Therapy: Agitated   PT - Cognitive impairments: Orientation, Awareness, Memory, Attention, Problem solving, Safety/Judgement   Orientation impairments: Time, Situation                   PT - Cognition Comments: pt requires re-orientation to situation, asking if he was in an accident. Poor awareness of medical situation at this time. Pt often falls asleep when not being stimulated Following commands: Impaired Following commands impaired: Follows one step commands inconsistently    Cueing Cueing Techniques: Verbal cues, Tactile cues, Visual cues  Exercises  General Comments General comments (skin integrity, edema, etc.): VSS on 1L [place] during PT session      Pertinent Vitals/Pain Pain Assessment Pain Assessment: Faces Faces Pain Scale: Hurts little more Pain Location: back Pain Descriptors / Indicators: Grimacing Pain Intervention(s): Monitored during session    Home Living                          Prior Function            PT Goals (current goals can now be found in the care plan section) Acute Rehab  PT Goals Patient Stated Goal: return home PT Goal Formulation: With patient Time For Goal Achievement: 08/06/24 Potential to Achieve Goals: Fair Progress towards PT goals: Goals downgraded-see care plan    Frequency    Min 2X/week      PT Plan      Co-evaluation              AM-PAC PT 6 Clicks Mobility   Outcome Measure  Help needed turning from your back to your side while in a flat bed without using bedrails?: A Lot Help needed moving from lying on your back to sitting on the side of a flat bed without using bedrails?: A Lot Help needed moving to and from a bed to a chair (including a wheelchair)?: Total Help needed standing up from a chair using your arms (e.g., wheelchair or bedside chair)?: Total Help needed to walk in hospital room?: Total Help needed climbing 3-5 steps with a railing? : Total 6 Click Score: 8    End of Session Equipment Utilized During Treatment: Oxygen Activity Tolerance: Patient limited by fatigue Patient left: in bed;with call bell/phone within reach;with bed alarm set Nurse Communication: Mobility status PT Visit Diagnosis: Other abnormalities of gait and mobility (R26.89);Muscle weakness (generalized) (M62.81);Unsteadiness on feet (R26.81)     Time: 8477-8460 PT Time Calculation (min) (ACUTE ONLY): 17 min  Charges:  1 Re-evaluation    PT General Charges $$ ACUTE PT VISIT: 1 Visit                     Bernardino JINNY Ruth, PT, DPT Acute Rehabilitation Office [phone]    Bernardino JINNY Ruth 07/23/2024, 5:00 PM

## 2024-07-23 NOTE — Progress Notes (Addendum)
 eLink Physician-Brief Progress Note Patient Name: ANDRI PRESTIA DOB: [birth year] MRN: [id number]   Date of Service  07/23/2024  HPI/Events of Note  Patient ripped off his BIPAP mask, he is wide awake and somewhat belligerent, Zyprexa  was just given.  eICU Interventions  Bilateral wrist restraints ordered.        Nollie Terlizzi U Roshaun Pound 07/23/2024, 4:19 AM

## 2024-07-23 NOTE — Progress Notes (Signed)
 RT assessed patient to wear Bipap at bedtime. Patient tolerated Bipap for 30 mins and became agitated and combative and wanted Bipap off. Bipap is on standby by bedside. RN and MD is aware

## 2024-07-23 NOTE — Progress Notes (Signed)
 "  NAME:  [name], MRN:  [id number], DOB:  27-Jul-1945, LOS: 10 ADMISSION DATE:  07/11/2024, CONSULTATION DATE:  07/22/24 REFERRING MD:  Francesco, CHIEF COMPLAINT:  GIB   History of Present Illness:  [name] is a 79 y.o. M with PMH significant for COPD on 3L home O2, AAA s/p repair in 2025, CAD on plavix  and asa, Atrial fibrillation not on anticoagulation, CKD 3, stage 1B non-small cell lung cancer s/p RUL lobectomy in 2007, chronic bladder outlet obstruction with indwelling foley, Hx of ETOH abuse and Barrett's esophagus who presented initially on 1/25 with chest pain and dyspnea secondary to COPD exacerbation.  This has now improved, however started experiencing melena and underwent an  EGD on 1/28 and ampullary adenoma and gastritis biopsies completed.    He continued to have melanotic stools, so had another EGD on 2/3 which showed oozing from the biopsy sites, on 2/2 a capsule endoscopy showed red blood in the stomach and small bowel.  These were clipped, however bleeding persisted and between 2/3 and 2/4 pt was given 1 unit PRBC's, 4 platelets, 2 FFP.  A CTA bleeding scan was ordered and critical care consulted in the setting of persistent GIB.   Hgb dropped to 6.7, improved to 7.6 with the above.  He has not required pressors and has been hemodynamically stable, however given persistent bleeding in the setting of the above, PCCM was consulted   Pertinent  Medical History  Per above  Significant Hospital Events: Including procedures, antibiotic start and stop dates in addition to other pertinent events   1/25 admit to IMTS  2/4 persistent GIB after EGD and bleeding sites were clipped, CTA bleeding scan and ICU consult  Interim History / Subjective:  Lethargic, confused on [place].   Objective    Blood pressure 90/63, pulse 88, temperature 98 F (36.7 C), temperature source Axillary, resp. rate (!) 24, height 6' (1.829 m), weight 68.1 kg, SpO2 100%.    Vent Mode: PRVC FiO2 (%):   [40 %] 40 % Set Rate:  [18 bmp] 18 bmp Vt Set:  [620 mL] 620 mL PEEP:  [5 cmH20-6 cmH20] 6 cmH20 Plateau Pressure:  [15 cmH20-16 cmH20] 15 cmH20   Intake/Output Summary (Last 24 hours) at 07/23/2024 9388 Last data filed at 07/23/2024 0300 Gross per 24 hour  Intake 1023.54 ml  Output 835 ml  Net 188.54 ml   Filed Weights   07/11/24 0522 07/12/24 1624 07/21/24 1606  Weight: 60 kg 61.3 kg 68.1 kg    Examination: General: NAD, confused  HENT: [place]/AT Lungs: CTAB Cardiovascular: rrr Abdomen: soft Extremities: warm  Neuro: NAD, confused   Resolved problem list   Assessment and Plan    Neuro Acute delirium CO2 Narcosis   -Unclear cause.  Initially felt to be due to hypoxia from volume overload.  However, required Zyprexa  last night after he was ripping his BiPAP off and aggressive towards the staff. -Required Bipap yesterday after VBG showed elevated CO2 levels. Repeat Co2 levels had improved on bipap.  -On Precedex  as needed, will try weaning down today. -Could be due to opiate withdrawal as he is on Percocet as needed at home.  Last Percocet was given on 2/4  Plan: -Wean precedex   -hold home percocet    Respiratory Acute respiratory failure with hypoxia -Initially suspected TRALI versus volume overload with products.  CXR shows worsening edema; findings similar to prior CXR which r/o TRALI  -Patient had to be intubated when he came into the  ICU for hypoxia. - Extubated on 2/5 required BiPAP due to low RR and VBG results showed elevated CO2. - Repeat VBG showed improvement in CO2.    Plan: Supportive care     Cardiac PAD Stable AAA Initial concern for possible endoleak on CT  -seen by vascular, no need for intervention -avoid HTN, continue to monitor   Persistent Atrial Fibrillation  CAD Hx Trifascicular Block -rate controlled at home with metoprolol  -Start BB if HR increases  -not on oral AC at baseline due to prior hx of GI bleed on xeralto  -follows with  EP outpatient  -hold bb in the setting of GIB and possible developing shock   GI ABLA secondary to GIB    -EGD on 1/28 with biopsies ; no stigmata of bleeding then -1/31 CSY with  polypectomy  -2/2 capsule endoscopy showed blood in stomach and small bowel -2/3 S/p EGDb which showed oozing from prior bx sites of EGD from 1/31; found adherent clot at each biopsy site with one oozing blood which was though to be the culprit of bleeding; clips were placed  -Bleeding continued and pt was transferred to ICU for further management  -CTA not able to localize bleeding  -Very unusual for bleeding from mx sites so suspect thrombocytopenia/coagulopathy; hematology consulted  -repeat CTA if significant rebleeding occurs or contact GI for repeat EGD -TEG shows fibrinogen defects; transfused 1 unit of FFP yesterday   Plan:  -If re-bleeding occurs: repeat CTA or contact GI for EGD  -Platelet transfusion for counts less than 20K or less than 50K with active bleeding -f/u further coagulapathy work-up ordered by Hematology  -hold plavix  - hematology consultation - trend Hgb, BUN, coagulation studies - continue protonix  40mg  BID  Nutrition -Got a cortrack today. Start trickle feeds tomorrow   ID  Lines: 3 peripheral lines placed on 2/4, 1 suprapubic catheter  Endo  Glucose levels appropriate  -Continue SSI  Renal  CKD 3 AA Baseline creatinine of 1.5-1.6 Elevated BUN/creatinine levels that have been stable in the ICU. -Trend BMPs daily   Heme Chronic anemia and thrombocytopenia   -Last platelet count 101K.  Platelets have been ranging from 40s to low 100s during this admission - Smear review done 07/12/2024 showed unremarkable platelet morphology with leukocytes unremarkable  Plan - Recommend platelet transfusion for counts less than 20K or less than 50K with active bleeding - Dr. Federico will make further recommendations -Other recommendations per above   Anticoagulation:  None Reason: GI bleed      Labs   CBC: Recent Labs  Lab 07/21/24 1804 07/21/24 1849 07/21/24 2221 07/22/24 0920 07/22/24 1543 07/22/24 2305  WBC 10.6*  --  11.7* 9.2 11.3* 11.2*  NEUTROABS 9.3*  --  9.6* 6.5 8.4* 9.3*  HGB 8.9* 7.8* 8.3* 8.1* 7.9* 8.3*  HCT 26.9* 23.0* 24.8* 24.4* 24.6* 25.0*  MCV 87.1  --  85.8 87.1 90.1 89.0  PLT 125*  --  105* 91* 101* 83*    Basic Metabolic Panel: Recent Labs  Lab 07/19/24 0603 07/20/24 0712 07/21/24 0821 07/21/24 1804 07/21/24 1849 07/22/24 0920 07/22/24 1543  NA 139 138 138 137 139 138  --   K 5.0 4.9 4.7 4.4 4.2 4.6  --   CL 109 109 106 103  --  104  --   CO2 21* 24 25 23   --  23  --   GLUCOSE 80 94 130* 110*  --  79  --   BUN 41* 34* 33* 32*  --  31*  --   CREATININE 1.58* 1.39* 1.38* 1.28*  --  1.41*  --   CALCIUM  7.9* 8.2* 8.0* 8.5*  --  8.0*  --   PHOS  --   --   --   --   --   --  4.9*   GFR: Estimated Creatinine Clearance: 41.6 mL/min (A) (by C-G formula based on SCr of 1.41 mg/dL (H)). Recent Labs  Lab 07/18/24 1917 07/19/24 0506 07/21/24 1809 07/21/24 2221 07/22/24 0920 07/22/24 1543 07/22/24 2305  WBC 5.4   < >  --  11.7* 9.2 11.3* 11.2*  LATICACIDVEN 1.7  --  2.2* 1.8  --   --   --    < > = values in this interval not displayed.    Liver Function Tests: Recent Labs  Lab 07/21/24 1804  AST 82*  ALT 81*  ALKPHOS 62  BILITOT 0.9  PROT 5.7*  ALBUMIN  3.5   No results for input(s): LIPASE, AMYLASE in the last 168 hours. Recent Labs  Lab 07/21/24 1804  AMMONIA 42*    ABG    Component Value Date/Time   PHART 7.389 07/21/2024 1849   PCO2ART 39.3 07/21/2024 1849   PO2ART 335 (H) 07/21/2024 1849   HCO3 28.9 (H) 07/22/2024 2305   TCO2 25 07/21/2024 1849   ACIDBASEDEF 2.6 (H) 07/22/2024 1543   O2SAT 47.5 07/22/2024 2305     Coagulation Profile: Recent Labs  Lab 07/18/24 2105 07/21/24 0821 07/22/24 0920  INR 1.3* 1.2 1.2    Cardiac Enzymes: No results for input(s): CKTOTAL,  CKMB, CKMBINDEX, TROPONINI in the last 168 hours.  HbA1C: Hemoglobin A1C  Date/Time Value Ref Range Status  09/23/2022 10:32 AM 5.9 (A) 4.0 - 5.6 % Final   Hgb A1c MFr Bld  Date/Time Value Ref Range Status  07/21/2024 06:02 PM 5.4 4.8 - 5.6 % Final    Comment:    (NOTE) Diagnosis of Diabetes The following HbA1c ranges recommended by the American Diabetes Association (ADA) may be used as an aid in the diagnosis of diabetes mellitus.  Hemoglobin             Suggested A1C NGSP%              Diagnosis  <5.7                   Non Diabetic  5.7-6.4                Pre-Diabetic  >6.4                   Diabetic  <7.0                   Glycemic control for                       adults with diabetes.    04/27/2021 08:48 AM 5.5 4.8 - 5.6 % Final    Comment:    (NOTE) Pre diabetes:          5.7%-6.4%  Diabetes:              >6.4%  Glycemic control for   <7.0% adults with diabetes     CBG: Recent Labs  Lab 07/22/24 1147 07/22/24 1506 07/22/24 1954 07/22/24 2303 07/23/24 0347  GLUCAP 105* 125* 117* 112* 100*    Review of Systems:   Per above   Past Medical History:  He,  has a past medical  history of AAA (abdominal aortic aneurysm), Alcohol abuse, Barretts esophagus, Bladder outlet obstruction (04/02/2018), CAD in native artery, Cataract, Cholelithiasis, Chronic osteomyelitis involving ankle and foot, right (HCC) (07/13/2018), 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   Surgical History:   Past Surgical  History:  Procedure Laterality Date   ABDOMINAL AORTIC ENDOVASCULAR FENESTRATED STENT GRAFT N/A 08/16/2022   Procedure: ABDOMINAL AORTIC ENDOVASCULAR FENESTRATED STENT GRAFT;  Surgeon: [name] ORN, MD;  Location: [facility] OR;  Service: Vascular;  Laterality: N/A;   ABDOMINAL AORTIC ENDOVASCULAR FENESTRATED STENT GRAFT N/A 12/02/2023   Procedure: ABDOMINAL AORTIC ENDOVASCULAR FENESTRATED STENT GRAFT WITH ULTRASOUND GUIDED ACCESS;  Surgeon: [name] ORN, MD;  Location: [facility] INVASIVE CV LAB;  Service: Cardiovascular;  Laterality: N/A;   BIOPSY  11/12/2020   Procedure: BIOPSY;  Surgeon: Legrand Victory LITTIE DOUGLAS, MD;  Location: [facility] ENDOSCOPY;  Service: Gastroenterology;;   BIOPSY  07/25/2021   Procedure: BIOPSY;  Surgeon: Stacia Glendia BRAVO, MD;  Location: [facility] ENDOSCOPY;  Service: Gastroenterology;;   BIOPSY  12/24/2021   Procedure: BIOPSY;  Surgeon: Wilhelmenia Aloha Raddle., MD;  Location: [facility] ENDOSCOPY;  Service: Gastroenterology;;   BIOPSY OF SKIN SUBCUTANEOUS TISSUE AND/OR MUCOUS MEMBRANE  07/14/2024   Procedure: BIOPSY, SKIN, SUBCUTANEOUS TISSUE, OR MUCOUS MEMBRANE;  Surgeon: Wilhelmenia Aloha Raddle., MD;  Location: [facility] ENDOSCOPY;  Service: Gastroenterology;;   BRONCHIAL BIOPSY  07/27/2021   Procedure: BRONCHIAL BIOPSIES;  Surgeon: Shelah Lamar RAMAN, MD;  Location: [facility] ENDOSCOPY;  Service: Cardiopulmonary;;   BRONCHIAL BRUSHINGS  07/27/2021   Procedure: BRONCHIAL BRUSHINGS;  Surgeon: Shelah Lamar RAMAN, MD;  Location: [facility] ENDOSCOPY;  Service: Cardiopulmonary;;   cataract surgery Bilateral 2018   removal of cataracts   COLONOSCOPY     hx polyps - [place] 02/2012   COLONOSCOPY N/A 07/17/2024   Procedure: COLONOSCOPY;  Surgeon: Wilhelmenia Aloha Raddle., MD;  Location: [facility] ENDOSCOPY;  Service: Gastroenterology;  Laterality: N/A;   COLONOSCOPY WITH PROPOFOL  N/A 11/12/2020   Procedure: COLONOSCOPY WITH PROPOFOL ;  Surgeon: Legrand Victory LITTIE DOUGLAS, MD;  Location: [facility] ENDOSCOPY;  Service: Gastroenterology;  Laterality: N/A;   CORONARY  ANGIOPLASTY WITH STENT PLACEMENT     ENDOVASCULAR STENT INSERTION Bilateral 08/16/2022   Procedure: ENDOVASCULAR STENT GRAFT INSERTION OF BILATERAL RENAL ARTERIES;  Surgeon: [name] ORN, MD;  Location: [facility] OR;  Service: Vascular;  Laterality: Bilateral;   ESOPHAGOGASTRODUODENOSCOPY N/A 07/14/2024   Procedure: EGD (ESOPHAGOGASTRODUODENOSCOPY);  Surgeon: Wilhelmenia Aloha Raddle., MD;  Location: [facility] ENDOSCOPY;  Service: Gastroenterology;  Laterality: N/A;   ESOPHAGOGASTRODUODENOSCOPY N/A 07/20/2024   Procedure: EGD (ESOPHAGOGASTRODUODENOSCOPY);  Surgeon: Leigh Elspeth SQUIBB, MD;  Location: [facility] ENDOSCOPY;  Service: Gastroenterology;  Laterality: N/A;   ESOPHAGOGASTRODUODENOSCOPY (EGD) WITH PROPOFOL  N/A 11/12/2020   Procedure: ESOPHAGOGASTRODUODENOSCOPY (EGD) WITH PROPOFOL ;  Surgeon: Legrand Victory LITTIE DOUGLAS, MD;  Location: [facility] ENDOSCOPY;  Service: Gastroenterology;  Laterality: N/A;   ESOPHAGOGASTRODUODENOSCOPY (EGD) WITH PROPOFOL  N/A 03/06/2021   Procedure: ESOPHAGOGASTRODUODENOSCOPY (EGD) WITH PROPOFOL ;  Surgeon: Leigh Elspeth SQUIBB, MD;  Location: [facility] ENDOSCOPY;  Service: Gastroenterology;  Laterality: N/A;   ESOPHAGOGASTRODUODENOSCOPY (EGD) WITH PROPOFOL  N/A 07/25/2021   Procedure: ESOPHAGOGASTRODUODENOSCOPY (EGD) WITH PROPOFOL ;  Surgeon: Stacia Glendia BRAVO, MD;  Location: [facility] ENDOSCOPY;  Service: Gastroenterology;  Laterality: N/A;  ESOPHAGOGASTRODUODENOSCOPY (EGD) WITH PROPOFOL  N/A 12/24/2021   Procedure: ESOPHAGOGASTRODUODENOSCOPY (EGD) WITH PROPOFOL ;  Surgeon: Wilhelmenia Aloha Raddle., MD;  Location: [facility] ENDOSCOPY;  Service: Gastroenterology;  Laterality: N/A;   EUS N/A 07/14/2024   Procedure: ULTRASOUND, UPPER GI TRACT, ENDOSCOPIC;  Surgeon: Wilhelmenia Aloha Raddle., MD;  Location: [facility] ENDOSCOPY;  Service: Gastroenterology;  Laterality: N/A;   flexible cystourethroscopy  05/24/06   GIVENS CAPSULE STUDY N/A 07/18/2024   Procedure: IMAGING PROCEDURE, GI TRACT, INTRALUMINAL, VIA CAPSULE;  Surgeon: Mansouraty, Aloha Raddle., MD;  Location: [facility] ENDOSCOPY;  Service: Gastroenterology;  Laterality: N/A;   HEMOSTASIS CLIP PLACEMENT  03/06/2021   Procedure: HEMOSTASIS CLIP PLACEMENT;  Surgeon: Leigh Elspeth SQUIBB, MD;  Location: [facility] ENDOSCOPY;  Service: Gastroenterology;;   HEMOSTASIS CLIP PLACEMENT  12/24/2021   Procedure: HEMOSTASIS CLIP PLACEMENT;  Surgeon: Wilhelmenia Aloha Raddle., MD;  Location: [facility] ENDOSCOPY;  Service: Gastroenterology;;   HEMOSTASIS CLIP PLACEMENT  07/20/2024   Procedure: CONTROL OF HEMORRHAGE, GI TRACT, ENDOSCOPIC, BY CLIPPING OR OVERSEWING;  Surgeon: Leigh Elspeth SQUIBB, MD;  Location: [facility] ENDOSCOPY;  Service: Gastroenterology;;   HOT HEMOSTASIS N/A 03/06/2021   Procedure: HOT HEMOSTASIS (ARGON PLASMA COAGULATION/BICAP);  Surgeon: Leigh Elspeth SQUIBB, MD;  Location: [facility] ENDOSCOPY;  Service: Gastroenterology;  Laterality: N/A;   INGUINAL HERNIA REPAIR  2005   INSERTION OF ILIAC STENT Bilateral 08/16/2022   Procedure: INSERTION OF ILIAC STENT RIGHT EXTERNAL ARTERY, LEFT COMMON ARTERY, AND RIGHT COMMON ARTERY;  Surgeon: [name] ORN, MD;  Location: [facility] OR;  Service: Vascular;  Laterality: Bilateral;   IR CATHETER TUBE CHANGE  07/08/2022   LUNG LOBECTOMY Right 11/07   RUL   MULTIPLE TOOTH EXTRACTIONS     POLYPECTOMY  11/12/2020   Procedure: POLYPECTOMY;  Surgeon: Legrand Victory LITTIE DOUGLAS, MD;  Location: [facility] ENDOSCOPY;  Service: Gastroenterology;;   POLYPECTOMY  12/24/2021   Procedure: POLYPECTOMY;  Surgeon: Wilhelmenia Aloha Raddle., MD;  Location: [facility] ENDOSCOPY;  Service: Gastroenterology;;   POLYPECTOMY  07/17/2024   Procedure: POLYPECTOMY, INTESTINE;  Surgeon: Wilhelmenia Aloha Raddle., MD;  Location: [facility] ENDOSCOPY;  Service: Gastroenterology;;   SUBMUCOSAL LIFTING INJECTION  12/24/2021   Procedure: SUBMUCOSAL LIFTING INJECTION;  Surgeon: Wilhelmenia Aloha Raddle., MD;  Location: [facility] ENDOSCOPY;  Service: Gastroenterology;;   TRANSURETHRAL RESECTION OF PROSTATE N/A 05/02/2021   Procedure: TRANSURETHRAL RESECTION OF  THE PROSTATE (TURP);  Surgeon: Carolee Sherwood JONETTA DOUGLAS, MD;  Location: [facility] ORS;  Service: Urology;  Laterality: N/A;   ULTRASOUND GUIDANCE FOR VASCULAR ACCESS Bilateral 08/16/2022   Procedure: ULTRASOUND GUIDANCE FOR VASCULAR ACCESS;  Surgeon: [name] ORN, MD;  Location: [facility] OR;  Service: Vascular;  Laterality: Bilateral;   UPPER ESOPHAGEAL ENDOSCOPIC ULTRASOUND (EUS) N/A 12/24/2021   Procedure: UPPER ESOPHAGEAL ENDOSCOPIC ULTRASOUND (EUS);  Surgeon: Wilhelmenia Aloha Raddle., MD;  Location: [facility] ENDOSCOPY;  Service: Gastroenterology;  Laterality: N/A;   UPPER GASTROINTESTINAL ENDOSCOPY     VIDEO BRONCHOSCOPY  07/27/2021   Procedure: VIDEO BRONCHOSCOPY WITHOUT FLUORO;  Surgeon: Shelah Lamar RAMAN, MD;  Location: [facility] ENDOSCOPY;  Service: Cardiopulmonary;;   VIDEO BRONCHOSCOPY Right 05/14/2024   Procedure: VIDEO BRONCHOSCOPY WITHOUT FLUORO;  Surgeon: Jude Harden GAILS, MD;  Location: [facility] ENDOSCOPY;  Service: Cardiopulmonary;  Laterality: Right;     Social History:   reports that he has been smoking cigarettes. He has a 41.3 pack-year smoking history. He has never used smokeless tobacco. He reports that he does not currently use alcohol. He reports that he does not use drugs.   Family History:  His family history includes Arthritis in his brother;  Diabetes in his mother and sister; Gout in his brother; Heart disease in his sister; Hypertension in his sister. There is no history of Colon cancer, Esophageal cancer, Rectal cancer, Stomach cancer, Colon polyps, Inflammatory bowel disease, Liver disease, or Pancreatic cancer.   Allergies Allergies[1]   Home Medications  Prior to Admission medications  Medication Sig Start Date End Date Taking? Authorizing Provider  albuterol  (VENTOLIN  HFA) 108 (90 Base) MCG/ACT inhaler Inhale 1-2 puffs into the lungs every 6 (six) hours as needed for wheezing or shortness of breath. 12/23/23  Yes [name], DO  amLODipine  (NORVASC ) 10 MG tablet Take 1 tablet (10 mg total) by  mouth daily. 12/09/23 12/08/24 Yes [name], DO  aspirin  EC 81 MG tablet Take 1 tablet (81 mg total) by mouth daily. Swallow whole. 04/14/23  Yes [name] ORN, MD  clopidogrel  (PLAVIX ) 75 MG tablet Take 1 tablet (75 mg total) by mouth daily. 12/11/23  Yes Bethanie Cough, PA-C  escitalopram  (LEXAPRO ) 5 MG tablet Take 1 tablet (5 mg total) by mouth daily. 07/08/24 01/04/25 Yes [name], DO  HYDROcodone -acetaminophen  (NORCO) 7.5-325 MG tablet Take 1 tablet by mouth 2 (two) times daily as needed for moderate pain (pain score 4-6). Fill 30 days after last fill. 06/24/24  Yes [name], DO  metoprolol  succinate (TOPROL -XL) 25 MG 24 hr tablet Take 1 tablet (25 mg total) by mouth daily. 06/24/24 06/24/25 Yes [name], DO  mirtazapine  (REMERON ) 7.5 MG tablet Take 2 tablets (15 mg total) by mouth at bedtime. 03/24/24  Yes [name], DO  Pancrelipase , Lip-Prot-Amyl, (ZENPEP ) 20000-63000 units CPEP Take 3 capsules by mouth 3 (three) times daily before meals. 07/05/24  Yes [name], DO  pantoprazole  (PROTONIX ) 40 MG tablet TAKE 1 TABLET EVERY DAY FOR REFLUX 12/09/23  Yes [name], DO  predniSONE  (DELTASONE ) 50 MG tablet Take one tablet 13 hours, 7 hours, and 1 hour prior to scan. 06/23/24  Yes [name] ORN, MD  rosuvastatin  (CRESTOR ) 40 MG tablet Take 1 tablet (40 mg total) by mouth daily. 04/03/23  Yes Jerrell Cleatus Ned, MD  sacubitril -valsartan  (ENTRESTO ) 97-103 MG Take 1 tablet by mouth 2 (two) times daily. 04/19/24  Yes [name], DO  spironolactone  (ALDACTONE ) 25 MG tablet Take 0.5 tablets (12.5 mg total) by mouth daily. 06/07/24 09/05/24 Yes [name], Callie E, PA-C  Tiotropium Bromide-Olodaterol (STIOLTO RESPIMAT ) 2.5-2.5 MCG/ACT AERS Inhale 2 puffs into the lungs daily. 02/27/24  Yes Hattar, Zola SAILOR, MD  diphenhydrAMINE  (BENADRYL ) 50 MG capsule Take one capsule 1 hour prior to scan. Patient not taking: Reported on 07/11/2024 06/23/24   [name] ORN, MD   furosemide  (LASIX ) 40 MG tablet Take 1 tablet (40 mg total) by mouth daily. Patient not taking: Reported on 07/11/2024 05/29/24   Harrie Bruckner, DO  OXYGEN Inhale 3 L/min into the lungs continuous.    [provider]     Critical care time:   Rebecka Pion, DO IM PGY1               [1]  Allergies Allergen Reactions   Candesartan Cilexetil-Hctz Other (See Comments)    Acute renal failure   Hydrochlorothiazide Other (See Comments)    Acute renal failure   Shellfish Allergy Other (See Comments)    Causes GOUT   Betadine [Povidone Iodine ] Itching   "

## 2024-07-23 NOTE — Progress Notes (Signed)
 eLink Physician-Brief Progress Note Patient Name: [name] DOB: 08/16/1945 MRN: [id number]   Date of Service  07/23/2024  HPI/Events of Note  pt needs something to help him relax, going on BIPAP, nurse asking about ativan  IV. QTC is 417  eICU Interventions  - geodon  10mg  x 1     Intervention Category Intermediate Interventions: Other: Evaluation Type: Other  Ura Yingling 07/23/2024, 9:49 PM

## 2024-07-26 ENCOUNTER — Ambulatory Visit (HOSPITAL_COMMUNITY)

## 2024-07-26 ENCOUNTER — Ambulatory Visit: Admitting: Surgery

## 2024-08-30 ENCOUNTER — Ambulatory Visit: Admitting: Surgery

## 2024-08-30 ENCOUNTER — Ambulatory Visit (HOSPITAL_COMMUNITY)

## 2024-08-31 ENCOUNTER — Ambulatory Visit: Admitting: Internal Medicine

## 2024-12-01 ENCOUNTER — Ambulatory Visit
